# Patient Record
Sex: Female | Born: 1955
Health system: Southern US, Community
[De-identification: ages and names within clinical notes are randomized; demographics above are authoritative.]

## PROBLEM LIST (undated history)

## (undated) DIAGNOSIS — N183 Chronic kidney disease, stage 3 unspecified: Secondary | ICD-10-CM

## (undated) DIAGNOSIS — I779 Disorder of arteries and arterioles, unspecified: Secondary | ICD-10-CM

## (undated) DIAGNOSIS — Z9289 Personal history of other medical treatment: Secondary | ICD-10-CM

## (undated) DIAGNOSIS — K56609 Unspecified intestinal obstruction, unspecified as to partial versus complete obstruction: Secondary | ICD-10-CM

## (undated) DIAGNOSIS — D696 Thrombocytopenia, unspecified: Secondary | ICD-10-CM

## (undated) DIAGNOSIS — I10 Essential (primary) hypertension: Secondary | ICD-10-CM

## (undated) DIAGNOSIS — J45909 Unspecified asthma, uncomplicated: Secondary | ICD-10-CM

## (undated) DIAGNOSIS — R001 Bradycardia, unspecified: Secondary | ICD-10-CM

## (undated) DIAGNOSIS — I2510 Atherosclerotic heart disease of native coronary artery without angina pectoris: Secondary | ICD-10-CM

## (undated) DIAGNOSIS — E039 Hypothyroidism, unspecified: Secondary | ICD-10-CM

## (undated) DIAGNOSIS — K429 Umbilical hernia without obstruction or gangrene: Secondary | ICD-10-CM

## (undated) DIAGNOSIS — F419 Anxiety disorder, unspecified: Secondary | ICD-10-CM

## (undated) DIAGNOSIS — I739 Peripheral vascular disease, unspecified: Secondary | ICD-10-CM

## (undated) DIAGNOSIS — N186 End stage renal disease: Secondary | ICD-10-CM

## (undated) DIAGNOSIS — E119 Type 2 diabetes mellitus without complications: Secondary | ICD-10-CM

## (undated) DIAGNOSIS — I219 Acute myocardial infarction, unspecified: Secondary | ICD-10-CM

## (undated) DIAGNOSIS — I509 Heart failure, unspecified: Secondary | ICD-10-CM

## (undated) DIAGNOSIS — Z95820 Peripheral vascular angioplasty status with implants and grafts: Secondary | ICD-10-CM

## (undated) DIAGNOSIS — R011 Cardiac murmur, unspecified: Secondary | ICD-10-CM

## (undated) DIAGNOSIS — J189 Pneumonia, unspecified organism: Secondary | ICD-10-CM

## (undated) DIAGNOSIS — D649 Anemia, unspecified: Secondary | ICD-10-CM

## (undated) DIAGNOSIS — Z992 Dependence on renal dialysis: Secondary | ICD-10-CM

## (undated) DIAGNOSIS — I5020 Unspecified systolic (congestive) heart failure: Secondary | ICD-10-CM

## (undated) DIAGNOSIS — E785 Hyperlipidemia, unspecified: Secondary | ICD-10-CM

## (undated) DIAGNOSIS — M109 Gout, unspecified: Secondary | ICD-10-CM

## (undated) DIAGNOSIS — R0600 Dyspnea, unspecified: Secondary | ICD-10-CM

## (undated) HISTORY — DX: Unspecified intestinal obstruction, unspecified as to partial versus complete obstruction: K56.609

## (undated) HISTORY — DX: Dependence on renal dialysis: N18.6

## (undated) HISTORY — DX: Gout, unspecified: M10.9

## (undated) HISTORY — DX: Anemia, unspecified: D64.9

## (undated) HISTORY — PX: SHOULDER SURGERY: SHX246

## (undated) HISTORY — DX: Acute myocardial infarction, unspecified: I21.9

## (undated) HISTORY — DX: End stage renal disease: Z99.2

## (undated) HISTORY — DX: Hyperlipidemia, unspecified: E78.5

## (undated) HISTORY — DX: Essential (primary) hypertension: I10

## (undated) HISTORY — DX: Disorder of arteries and arterioles, unspecified: I77.9

## (undated) HISTORY — DX: Unspecified asthma, uncomplicated: J45.909

## (undated) HISTORY — DX: Type 2 diabetes mellitus without complications: E11.9

## (undated) HISTORY — DX: Umbilical hernia without obstruction or gangrene: K42.9

## (undated) HISTORY — DX: Atherosclerotic heart disease of native coronary artery without angina pectoris: I25.10

## (undated) HISTORY — DX: Peripheral vascular disease, unspecified: I73.9

---

## 1987-09-19 HISTORY — PX: HERNIA REPAIR: SHX51

## 2003-09-19 HISTORY — PX: CORONARY ARTERY BYPASS GRAFT: SHX141

## 2003-11-13 ENCOUNTER — Inpatient Hospital Stay (HOSPITAL_COMMUNITY): Admission: EM | Admit: 2003-11-13 | Discharge: 2003-11-21 | Payer: Self-pay | Admitting: Cardiology

## 2004-01-18 ENCOUNTER — Inpatient Hospital Stay (HOSPITAL_COMMUNITY): Admission: EM | Admit: 2004-01-18 | Discharge: 2004-01-23 | Payer: Self-pay | Admitting: *Deleted

## 2004-03-24 ENCOUNTER — Ambulatory Visit (HOSPITAL_COMMUNITY): Admission: RE | Admit: 2004-03-24 | Discharge: 2004-03-25 | Payer: Self-pay | Admitting: *Deleted

## 2004-03-30 ENCOUNTER — Encounter: Payer: Self-pay | Admitting: Cardiology

## 2004-04-06 ENCOUNTER — Ambulatory Visit (HOSPITAL_COMMUNITY): Admission: RE | Admit: 2004-04-06 | Discharge: 2004-04-07 | Payer: Self-pay | Admitting: *Deleted

## 2004-06-20 ENCOUNTER — Inpatient Hospital Stay (HOSPITAL_COMMUNITY): Admission: EM | Admit: 2004-06-20 | Discharge: 2004-06-22 | Payer: Self-pay | Admitting: Emergency Medicine

## 2004-07-16 ENCOUNTER — Ambulatory Visit: Payer: Self-pay | Admitting: Cardiology

## 2004-07-16 ENCOUNTER — Inpatient Hospital Stay (HOSPITAL_COMMUNITY): Admission: EM | Admit: 2004-07-16 | Discharge: 2004-07-20 | Payer: Self-pay | Admitting: Cardiology

## 2004-07-29 ENCOUNTER — Ambulatory Visit: Payer: Self-pay | Admitting: Cardiology

## 2004-08-05 ENCOUNTER — Ambulatory Visit: Payer: Self-pay | Admitting: Cardiology

## 2004-10-03 ENCOUNTER — Ambulatory Visit: Payer: Self-pay | Admitting: *Deleted

## 2004-10-04 ENCOUNTER — Inpatient Hospital Stay (HOSPITAL_COMMUNITY): Admission: AD | Admit: 2004-10-04 | Discharge: 2004-10-05 | Payer: Self-pay | Admitting: *Deleted

## 2004-10-07 ENCOUNTER — Ambulatory Visit: Payer: Self-pay | Admitting: Cardiology

## 2004-10-14 ENCOUNTER — Ambulatory Visit: Payer: Self-pay | Admitting: Cardiology

## 2004-11-18 ENCOUNTER — Ambulatory Visit (HOSPITAL_COMMUNITY): Admission: RE | Admit: 2004-11-18 | Discharge: 2004-11-19 | Payer: Self-pay | Admitting: Orthopedic Surgery

## 2005-01-23 ENCOUNTER — Emergency Department (HOSPITAL_COMMUNITY): Admission: EM | Admit: 2005-01-23 | Discharge: 2005-01-23 | Payer: Self-pay | Admitting: Emergency Medicine

## 2005-02-01 ENCOUNTER — Ambulatory Visit: Payer: Self-pay | Admitting: Cardiology

## 2005-07-17 ENCOUNTER — Ambulatory Visit: Payer: Self-pay | Admitting: Cardiology

## 2005-08-29 ENCOUNTER — Ambulatory Visit: Payer: Self-pay | Admitting: Cardiology

## 2005-08-30 ENCOUNTER — Ambulatory Visit (HOSPITAL_COMMUNITY): Admission: RE | Admit: 2005-08-30 | Discharge: 2005-08-30 | Payer: Self-pay | Admitting: Orthopedic Surgery

## 2005-11-07 ENCOUNTER — Ambulatory Visit: Payer: Self-pay | Admitting: Cardiology

## 2006-08-27 ENCOUNTER — Encounter: Payer: Self-pay | Admitting: Cardiology

## 2006-08-27 ENCOUNTER — Inpatient Hospital Stay (HOSPITAL_COMMUNITY): Admission: EM | Admit: 2006-08-27 | Discharge: 2006-08-31 | Payer: Self-pay | Admitting: Emergency Medicine

## 2006-08-27 ENCOUNTER — Ambulatory Visit: Payer: Self-pay | Admitting: Cardiovascular Disease

## 2006-09-14 ENCOUNTER — Ambulatory Visit: Payer: Self-pay | Admitting: Cardiology

## 2006-10-26 ENCOUNTER — Ambulatory Visit: Payer: Self-pay | Admitting: Cardiology

## 2006-12-17 ENCOUNTER — Ambulatory Visit: Payer: Self-pay | Admitting: Cardiology

## 2007-01-17 ENCOUNTER — Ambulatory Visit: Payer: Self-pay | Admitting: Endocrinology

## 2007-01-30 ENCOUNTER — Ambulatory Visit: Payer: Self-pay | Admitting: Endocrinology

## 2007-02-22 ENCOUNTER — Ambulatory Visit: Payer: Self-pay | Admitting: Endocrinology

## 2007-03-27 ENCOUNTER — Ambulatory Visit: Payer: Self-pay | Admitting: Endocrinology

## 2007-03-31 DIAGNOSIS — I2510 Atherosclerotic heart disease of native coronary artery without angina pectoris: Secondary | ICD-10-CM | POA: Insufficient documentation

## 2007-03-31 DIAGNOSIS — I9589 Other hypotension: Secondary | ICD-10-CM | POA: Insufficient documentation

## 2007-03-31 DIAGNOSIS — E785 Hyperlipidemia, unspecified: Secondary | ICD-10-CM | POA: Insufficient documentation

## 2007-03-31 DIAGNOSIS — E782 Mixed hyperlipidemia: Secondary | ICD-10-CM | POA: Insufficient documentation

## 2007-03-31 DIAGNOSIS — I10 Essential (primary) hypertension: Secondary | ICD-10-CM | POA: Insufficient documentation

## 2007-04-24 ENCOUNTER — Ambulatory Visit: Payer: Self-pay | Admitting: Endocrinology

## 2007-05-18 ENCOUNTER — Encounter: Payer: Self-pay | Admitting: Endocrinology

## 2007-05-27 ENCOUNTER — Ambulatory Visit: Payer: Self-pay | Admitting: Endocrinology

## 2007-05-27 ENCOUNTER — Encounter: Payer: Self-pay | Admitting: Endocrinology

## 2007-05-27 LAB — CONVERTED CEMR LAB
Creatinine,U: 66.7 mg/dL
Microalb, Ur: 0.2 mg/dL (ref 0.0–1.9)

## 2007-07-22 ENCOUNTER — Telehealth: Payer: Self-pay | Admitting: Endocrinology

## 2007-08-13 ENCOUNTER — Encounter: Payer: Self-pay | Admitting: Endocrinology

## 2007-08-14 ENCOUNTER — Encounter: Payer: Self-pay | Admitting: Endocrinology

## 2007-08-19 ENCOUNTER — Ambulatory Visit: Payer: Self-pay | Admitting: Endocrinology

## 2007-12-17 ENCOUNTER — Ambulatory Visit: Payer: Self-pay | Admitting: Endocrinology

## 2008-01-06 ENCOUNTER — Emergency Department (HOSPITAL_COMMUNITY): Admission: EM | Admit: 2008-01-06 | Discharge: 2008-01-07 | Payer: Self-pay | Admitting: Emergency Medicine

## 2008-03-12 ENCOUNTER — Ambulatory Visit: Payer: Self-pay | Admitting: Endocrinology

## 2008-05-13 ENCOUNTER — Telehealth (INDEPENDENT_AMBULATORY_CARE_PROVIDER_SITE_OTHER): Payer: Self-pay | Admitting: *Deleted

## 2008-06-15 ENCOUNTER — Ambulatory Visit: Payer: Self-pay | Admitting: Endocrinology

## 2008-06-15 LAB — CONVERTED CEMR LAB: Hgb A1c MFr Bld: 7.1 % — ABNORMAL HIGH (ref 4.6–6.0)

## 2008-09-18 ENCOUNTER — Emergency Department (HOSPITAL_COMMUNITY): Admission: EM | Admit: 2008-09-18 | Discharge: 2008-09-18 | Payer: Self-pay | Admitting: Emergency Medicine

## 2008-09-18 HISTORY — PX: CHOLECYSTECTOMY: SHX55

## 2008-09-21 ENCOUNTER — Telehealth: Payer: Self-pay | Admitting: Endocrinology

## 2008-10-09 ENCOUNTER — Ambulatory Visit: Payer: Self-pay | Admitting: Endocrinology

## 2008-10-12 ENCOUNTER — Telehealth: Payer: Self-pay | Admitting: Endocrinology

## 2008-10-12 ENCOUNTER — Encounter (INDEPENDENT_AMBULATORY_CARE_PROVIDER_SITE_OTHER): Payer: Self-pay | Admitting: *Deleted

## 2008-10-15 ENCOUNTER — Telehealth: Payer: Self-pay | Admitting: Endocrinology

## 2008-12-25 ENCOUNTER — Encounter: Payer: Self-pay | Admitting: Endocrinology

## 2009-01-07 ENCOUNTER — Ambulatory Visit: Payer: Self-pay | Admitting: Endocrinology

## 2009-06-22 ENCOUNTER — Ambulatory Visit: Payer: Self-pay | Admitting: Endocrinology

## 2009-07-29 ENCOUNTER — Encounter: Payer: Self-pay | Admitting: Cardiology

## 2009-07-30 ENCOUNTER — Encounter: Payer: Self-pay | Admitting: Cardiology

## 2009-08-26 ENCOUNTER — Encounter: Payer: Self-pay | Admitting: Cardiology

## 2009-08-26 DIAGNOSIS — J45909 Unspecified asthma, uncomplicated: Secondary | ICD-10-CM | POA: Insufficient documentation

## 2009-08-26 DIAGNOSIS — E669 Obesity, unspecified: Secondary | ICD-10-CM | POA: Insufficient documentation

## 2009-08-27 ENCOUNTER — Ambulatory Visit: Payer: Self-pay | Admitting: Cardiology

## 2009-08-27 DIAGNOSIS — F17200 Nicotine dependence, unspecified, uncomplicated: Secondary | ICD-10-CM | POA: Insufficient documentation

## 2009-09-03 ENCOUNTER — Ambulatory Visit: Payer: Self-pay | Admitting: Cardiology

## 2009-09-03 ENCOUNTER — Encounter: Payer: Self-pay | Admitting: Cardiology

## 2009-09-08 ENCOUNTER — Encounter (INDEPENDENT_AMBULATORY_CARE_PROVIDER_SITE_OTHER): Payer: Self-pay | Admitting: *Deleted

## 2009-09-18 DIAGNOSIS — K56609 Unspecified intestinal obstruction, unspecified as to partial versus complete obstruction: Secondary | ICD-10-CM

## 2009-09-18 HISTORY — DX: Unspecified intestinal obstruction, unspecified as to partial versus complete obstruction: K56.609

## 2009-10-20 ENCOUNTER — Ambulatory Visit: Payer: Self-pay | Admitting: Endocrinology

## 2009-10-20 LAB — CONVERTED CEMR LAB: Fructosamine: 278 micromoles/L (ref <285)

## 2010-01-02 ENCOUNTER — Ambulatory Visit: Payer: Self-pay | Admitting: Cardiology

## 2010-01-03 ENCOUNTER — Ambulatory Visit: Payer: Self-pay | Admitting: Cardiology

## 2010-01-03 ENCOUNTER — Inpatient Hospital Stay (HOSPITAL_COMMUNITY): Admission: EM | Admit: 2010-01-03 | Discharge: 2010-01-06 | Payer: Self-pay | Admitting: Cardiovascular Disease

## 2010-01-03 ENCOUNTER — Encounter: Payer: Self-pay | Admitting: Cardiovascular Disease

## 2010-01-06 ENCOUNTER — Encounter: Payer: Self-pay | Admitting: Cardiovascular Disease

## 2010-01-06 ENCOUNTER — Telehealth (INDEPENDENT_AMBULATORY_CARE_PROVIDER_SITE_OTHER): Payer: Self-pay | Admitting: *Deleted

## 2010-01-07 ENCOUNTER — Ambulatory Visit: Payer: Self-pay | Admitting: Cardiology

## 2010-01-07 ENCOUNTER — Encounter (INDEPENDENT_AMBULATORY_CARE_PROVIDER_SITE_OTHER): Payer: Self-pay | Admitting: *Deleted

## 2010-01-07 ENCOUNTER — Ambulatory Visit: Payer: Self-pay | Admitting: Internal Medicine

## 2010-01-07 ENCOUNTER — Inpatient Hospital Stay (HOSPITAL_COMMUNITY): Admission: EM | Admit: 2010-01-07 | Discharge: 2010-01-10 | Payer: Self-pay | Admitting: Emergency Medicine

## 2010-01-08 ENCOUNTER — Encounter: Payer: Self-pay | Admitting: Cardiology

## 2010-01-09 ENCOUNTER — Encounter: Payer: Self-pay | Admitting: Cardiology

## 2010-01-18 ENCOUNTER — Ambulatory Visit: Payer: Self-pay | Admitting: Endocrinology

## 2010-01-18 ENCOUNTER — Encounter: Payer: Self-pay | Admitting: Cardiology

## 2010-01-18 DIAGNOSIS — R809 Proteinuria, unspecified: Secondary | ICD-10-CM | POA: Insufficient documentation

## 2010-01-18 LAB — CONVERTED CEMR LAB
Hgb A1c MFr Bld: 8.2 % — ABNORMAL HIGH (ref 4.6–6.5)
Microalb Creat Ratio: 39 mg/g — ABNORMAL HIGH (ref 0.0–30.0)
Microalb, Ur: 3.1 mg/dL — ABNORMAL HIGH (ref 0.0–1.9)

## 2010-01-20 ENCOUNTER — Encounter: Payer: Self-pay | Admitting: Cardiology

## 2010-01-27 ENCOUNTER — Encounter: Payer: Self-pay | Admitting: Cardiology

## 2010-01-28 ENCOUNTER — Ambulatory Visit: Payer: Self-pay | Admitting: Cardiology

## 2010-02-08 ENCOUNTER — Encounter: Payer: Self-pay | Admitting: Cardiology

## 2010-02-21 ENCOUNTER — Telehealth: Payer: Self-pay | Admitting: Endocrinology

## 2010-02-28 ENCOUNTER — Telehealth: Payer: Self-pay | Admitting: Endocrinology

## 2010-03-04 ENCOUNTER — Ambulatory Visit (HOSPITAL_COMMUNITY): Admission: RE | Admit: 2010-03-04 | Discharge: 2010-03-07 | Payer: Self-pay | Admitting: General Surgery

## 2010-03-04 ENCOUNTER — Encounter: Payer: Self-pay | Admitting: Cardiology

## 2010-03-04 HISTORY — PX: OTHER SURGICAL HISTORY: SHX169

## 2010-03-07 ENCOUNTER — Encounter: Payer: Self-pay | Admitting: Cardiology

## 2010-04-01 ENCOUNTER — Encounter: Payer: Self-pay | Admitting: Endocrinology

## 2010-04-05 ENCOUNTER — Encounter: Payer: Self-pay | Admitting: Cardiology

## 2010-04-19 ENCOUNTER — Ambulatory Visit: Payer: Self-pay | Admitting: Endocrinology

## 2010-04-19 LAB — CONVERTED CEMR LAB: Hgb A1c MFr Bld: 7.5 % — ABNORMAL HIGH (ref 4.6–6.5)

## 2010-07-21 ENCOUNTER — Ambulatory Visit: Payer: Self-pay | Admitting: Endocrinology

## 2010-10-18 NOTE — Medication Information (Signed)
Summary: Plavix Assitance  Plavix Assitance   Imported By: Gurney Maxin, RN, BSN 01/28/2010 16:10:12  _____________________________________________________________________  External Attachment:    Type:   Image     Comment:   External Document  Appended Document: Plavix Assitance Pt called stating she still hasn't heard anything about Plavix. Pt states she is out of Plavix and can't afford to get anymore right now. Spoke with assistance program staff member who states they did not receive fax from 01/28/10. She gave alternate fax number of [phone]. She also stated to put "Please Rush" on form when faxed. This was faxed and confirmation received. Pt notified. She will be provided with as many samples as we can give her at this time. However, discussed with pt importance of not being off Plavix and risk involved if she is off of this medication.   Pt given Samples [id number] 9/12 #8, [id number] 2/12, #4

## 2010-10-18 NOTE — Letter (Signed)
Summary: [facility]   Imported By: Phillis Knack 04/11/2010 11:03:07  _____________________________________________________________________  External Attachment:    Type:   Image     Comment:   External Document

## 2010-10-18 NOTE — Op Note (Signed)
[facility]   Imported By: Sallee Provencal 03/30/2010 16:47:41  _____________________________________________________________________  External Attachment:    Type:   Image     Comment:   External Document

## 2010-10-18 NOTE — Assessment & Plan Note (Signed)
Summary: 4 MTH FU  STC   Vital Signs:  Patient profile:   55 year old female Height:      67 inches (170.18 cm) Weight:      205.38 pounds (93.35 kg) BMI:     32.28 O2 Sat:      94 % on Room air Temp:     98.6 degrees F (37.00 degrees C) oral Pulse rate:   61 / minute BP sitting:   126 / 80  (left arm) Cuff size:   large  Vitals Entered By: Gardenia Phlegm CMA (October 20, 2009 10:36 AM)  O2 Flow:  Room air CC: 4 month follow up/ CF Is Patient Diabetic? Yes   Referring Provider:  Valentino Saxon Primary Provider:  Rory Percy  CC:  4 month follow up/ CF.  History of Present Illness: pt says she sometimes misses her insulin, as she recently had gb surgery (weight is down 11 lbs since last ov).  she has mild hypolgycemia after the evening meal.  she says it is still highest in am.  Current Medications (verified): 1)  Humulin N Pen 100 Unit/ml Susp (Insulin Isophane Human) .... Inject 20 Units Subcutaneously At Bedtime 2)  Lipitor 80 Mg Tabs (Atorvastatin Calcium) .... Take One Tablet By Mouth Daily. 3)  Metoprolol Tartrate 25 Mg Tabs (Metoprolol Tartrate) .... Take One Tablet By Mouth Twice A Day 4)  Plavix 75 Mg  Tabs (Clopidogrel Bisulfate) .... Take 1 By Mouth Qd 5)  Nitroquick 0.4 Mg Subl (Nitroglycerin) 6)  Humalog Kwikpen 100 Unit/ml  Soln (Insulin Lispro (Human)) .... 25 Units At Breakfast, 25 Units With Lunch, and 10 With Supper 7)  Accupril 5 Mg  Tabs (Quinapril Hcl) .... Take 1 By Mouth Qd 8)  Isosorbide Dinitrate 20 Mg Tabs (Isosorbide Dinitrate) .... Take 3 Tablets By Mouth Two Times A Day 9)  Bayer Aspirin 325 Mg  Tabs (Aspirin) .Marland Kitchen.. 1 By Mouth Once Daily 10)  Colchicine 0.6 Mg  Tabs (Colchicine) .Marland Kitchen.. 1 By Mouth Once Daily As Needed For Gout 11)  Bd U/f Short Pen Needle 31g X 8 Mm Misc (Insulin Pen Needle) .... Inject Three Times A Day  Allergies (verified): 1)  Amoxicillin (Amoxicillin) 2)  Penicillin  Past History:  Past Medical History: Last updated:  08/26/2009 CAD...cutting balloon angioplasty to vein graft... December, 2007 Groin bleed.... significant... PCI December, 2007 CABG Diabetes mellitus, type II Hyperlipidemia Hypertension Renal failure gout right leg discomfort.... chronic LV function.... good Asthma Overweight  Review of Systems  The patient denies syncope.    Physical Exam  General:  obese.  no distress  Neck:  Supple without thyroid enlargement or tenderness. No cervical lymphadenopathy Additional Exam:  (pt says her a1c was 7.6 at dr [facility] < 1 month ago)  fructosamine=278 (converts to a1c of 6.4)  FastTSH                   1.92 uIU/mL   Impression & Recommendations:  Problem # 1:  DIABETES MELLITUS, TYPE II (ICD-250.00) slightly overcontrolled  Other Orders: T-Fructosamine [id number]) TLB-TSH (Thyroid Stimulating Hormone) (84443-TSH) Est. Patient Level III [id number])  Patient Instructions: 1)  continue: 2)  nph 20 units at night 3)  humalog (just before each meal) 25-25-10 (and 5 units with bedtime snack, if you choose to eat one.  4)  ret 3 mos 5)  check your blood sugar 3 times a day.  vary the time of day when you check, between before the 3 meals,  and at bedtime.  also check if you have symptoms of your blood sugar being too high or too low.  please keep a record of the readings and bring it to your next appointment here.  please call us sooner if you are having low blood sugar episodes. 6)  tests are being ordered for you today.  a few days after the test(s), please call [phone] to hear your test results. 7)  (update: i left message on phone-tree:  rx as we discussed) 8)  reduce humalog to (just before each meal) 25-25-5 units.  call if any more hypoglycemia)

## 2010-10-18 NOTE — Letter (Signed)
Summary: Appointment- Rescheduled  [facility] [place]. [address]   [place], [place] [address]   Phone: [phone]  Fax: [phone]     Jan 20, 2010 MRN: [id number]     Donna Howe [address] [place], [place]  [address]   Dear Ms. Lamarre,   Due to a change in our office schedule, your appointment on  May 13th, 2011 at  12:45pm must be changed.   Your new appointment will be May 13th, 2011 at 1:45pm.  We look forward to participating in your health care needs.      Sincerely,  Public relations account executive

## 2010-10-18 NOTE — Letter (Signed)
[facility]   Imported By: Sallee Provencal 03/30/2010 16:50:07  _____________________________________________________________________  External Attachment:    Type:   Image     Comment:   External Document

## 2010-10-18 NOTE — Assessment & Plan Note (Signed)
Summary: [facility] 4/21  Medications Added NITROSTAT 0.4 MG SUBL (NITROGLYCERIN) dissolve one tablet under tongue for severe chest pain as needed every 5 minutes, not to exceed 3 in 15 min time frame HUMALOG KWIKPEN 100 UNIT/ML  SOLN (INSULIN LISPRO (HUMAN)) 25 units at breakfast, 5 units with lunch, and 15 with supper        Visit Type:  surgical clearance Referring Provider:  Marlaine Hind, MD Primary Provider:  Rory Percy  CC:  CAD.  History of Present Illness: The patient has known coronary artery disease.  I have reviewed all of her recent medical records from the hospital.  She was admitted with cardiac symptoms.  Catheterization on January 05, 2010 revealed that her grafts were patent but that she had a high-grade stenosis in one of her stents.  She underwent successful cutting balloon angioplasty to this area.  She is well cardiac viewpoint since then.  However she was readmitted to the hospital on January 07, 2010 with partial small bowel obstruction related to a large left lower abdominal hernia.  Fortunately she stabilized medically.  She was discharged with plans to see her back and reassess whether she could undergo abdominal surgery in the near future.  She has not been having any significant chest pain.  She's not having any shortness of breath.  She did have some symptoms of left lower quadrant discomfort suggestive of her abdominal problems.  This occurred the other day and fortunately resolved.  Preventive Screening-Counseling & Management  Alcohol-Tobacco     Smoking Status: quit     Year Started: 25 + off/on     Year Quit: 3 weeks ago  Current Medications (verified): 1)  Humulin N Pen 100 Unit/ml Susp (Insulin Isophane Human) .... Inject 20 Units Subcutaneously At Bedtime 2)  Lipitor 80 Mg Tabs (Atorvastatin Calcium) .... Take One Tablet By Mouth Daily. 3)  Metoprolol Tartrate 25 Mg Tabs (Metoprolol Tartrate) .... Take One Tablet By Mouth Twice A Day 4)  Plavix 75 Mg   Tabs (Clopidogrel Bisulfate) .... Take 1 By Mouth Qd 5)  Nitrostat 0.4 Mg Subl (Nitroglycerin) .... Dissolve One Tablet Under Tongue For Severe Chest Pain As Needed Every 5 Minutes, Not To Exceed 3 in 15 Min Time Frame 6)  Humalog Kwikpen 100 Unit/ml  Soln (Insulin Lispro (Human)) .... 25 Units At Breakfast, 5 Units With Lunch, and 15 With Supper 7)  Accupril 5 Mg  Tabs (Quinapril Hcl) .... Take 1 By Mouth Qd 8)  Isosorbide Dinitrate 20 Mg Tabs (Isosorbide Dinitrate) .... Take 3 Tablets By Mouth Two Times A Day 9)  Bayer Aspirin 325 Mg  Tabs (Aspirin) .Marland Kitchen.. 1 By Mouth Once Daily 10)  Colchicine 0.6 Mg  Tabs (Colchicine) .Marland Kitchen.. 1 By Mouth Once Daily As Needed For Gout 11)  Bd U/f Short Pen Needle 31g X 8 Mm Misc (Insulin Pen Needle) .... Inject Three Times A Day  Allergies: 1)  Amoxicillin (Amoxicillin) 2)  Penicillin   Samples of Plavix given: Lot # Jocelyn Lamer Exp Date: 3/12 #8 Lot #[id number] Exp Date: 2/12 #8 Lot #[id number] Exp Date: 9/12 #4 Gurney Maxin, RN, BSN  Jan 28, 2010 3:13 PM   Past History:  Past Medical History: Last updated: 01/27/2010 CAD...cutting balloon angioplasty to vein graft... December, 2007 /  catheterization  January 05, 2010... PCI.  In-stent restenosis and the anastomosis of the SVG to OM Small bowel obstruction... January 07, 2010   ( days after PCI)... medical treatment... plan abdominal surgery later Groin bleed.Marland KitchenMarland KitchenMarland Kitchen  significant... PCI December, 2007 CABG  2005... LIMA LAD, SVG OM, SVG right coronary artery Diabetes mellitus, type II Hyperlipidemia Hypertension Renal failure gout right leg discomfort.... chronic EF 60%... echo... December 2 010 Asthma Overweight  Social History: Smoking Status:  quit  Review of Systems       Patient denies fever, chills, headache, sweats, rash, change in vision, change in hearing, chest pain, cough, nausea vomiting, urinary symptoms.  All other systems are reviewed and are negative.  Vital Signs:  Patient profile:   55 year old  female Height:      66 inches Weight:      214 pounds Pulse rate:   61 / minute BP sitting:   131 / 69  (left arm) Cuff size:   regular  Vitals Entered By: Lovina Reach, LPN (May 13, 624THL [id number] PM) CC: CAD Is Patient Diabetic? Yes Comments post hosp    Physical Exam  General:  patient is stable today. Head:  head is atraumatic. Eyes:  no xanthelasma. Neck:  no jugular distention. Chest Wall:  no chest wall tenderness. Lungs:  lungs are clear.  Respiratory effort is nonlabored. Heart:  cardiac exam reveals S1-S2.  No clicks or significant murmurs. Abdomen:  abdomen is soft. Msk:  no musculoskeletal deformities. Extremities:  no peripheral edema. Skin:  no skin rashes. Psych:  patient is oriented to person time and place.  Affect is normal.   Impression & Recommendations:  Problem # 1:  * SMALL BOWEL OBSTRUCTION The patient needs surgery for the hernia that causes her intermittent small bowel obstruction.  After careful review of all data she can be cleared for this surgery in June with Dr. Marlaine Hind.  The patient's Plavix can be stopped 5 days before the operation.  Hopefully she can remain on aspirin.  Plavix can be restarted as soon it is safe postop.  If it is absolutely necessary her aspirin can also be held for the surgery.  It is not absolutely necessary my preference would be to keep her on aspirin.  Problem # 2:  OVERWEIGHT (ICD-278.02) Weight loss will be very important to her for multiple reasons.  Problem # 3:  HYPERTENSION (ICD-401.9)  Her updated medication list for this problem includes:    Metoprolol Tartrate 25 Mg Tabs (Metoprolol tartrate) .Marland Kitchen... Take one tablet by mouth twice a day    Accupril 5 Mg Tabs (Quinapril hcl) .Marland Kitchen... Take 1 by mouth qd    Bayer Aspirin 325 Mg Tabs (Aspirin) .Marland Kitchen... 1 by mouth once daily Blood pressure is controlled today.  No change in therapy.  Problem # 4:  HYPERLIPIDEMIA (B2193296.4)  Her updated medication list for this problem  includes:    Lipitor 80 Mg Tabs (Atorvastatin calcium) .Marland Kitchen... Take one tablet by mouth daily. Patient is not on treatment for her lipids.  No change in therapy.  Problem # 5:  CORONARY ARTERY DISEASE (ICD-414.00) Coronary disease is stable.  I have reviewed her prior data at great length.  It is very important to note that she has had drug-eluting stents in the past but none within the most recent 2 years.  Her most recent intervention was a cutting balloon procedure with no new stents.  Therefore considering the difficulty she's had with her abdominal hernia it is appropriate to hold her Plavix in June of this year so that she can have abdominal surgery.  Patient Instructions: 1)  Your physician wants you to follow-up in: 3 months. You will receive a reminder  letter in the mail one-two months in advance. If you don't receive a letter, please call our office to schedule the follow-up appointment. 2)  Your physician recommends that you continue on your current medications as directed. Please refer to the Current Medication list given to you today.

## 2010-10-18 NOTE — Letter (Signed)
Summary: [facility]   Imported By: Ranell Patrick 03/24/2010 16:26:09  _____________________________________________________________________  External Attachment:    Type:   Image     Comment:   External Document

## 2010-10-18 NOTE — Miscellaneous (Signed)
  Clinical Lists Changes  Problems: Added new problem of * SMALL BOWEL OBSTRUCTION Observations: Added new observation of PAST MED HX: CAD...cutting balloon angioplasty to vein graft... December, 2007 /  catheterization  January 05, 2010... PCI.  In-stent restenosis and the anastomosis of the SVG to OM Small bowel obstruction... January 07, 2010   ( days after PCI)... medical treatment... plan abdominal surgery later Groin bleed.... significant... PCI December, 2007 CABG  2005... LIMA LAD, SVG OM, SVG right coronary artery Diabetes mellitus, type II Hyperlipidemia Hypertension Renal failure gout right leg discomfort.... chronic EF 60%... echo... December 2 010 Asthma Overweight (01/27/2010 16:34) Added new observation of REFERRING MD: Valentino Saxon (01/27/2010 16:34) Added new observation of PRIMARY MD: Rory Percy (01/27/2010 16:34)       Past History:  Past Medical History: CAD...cutting balloon angioplasty to vein graft... December, 2007 /  catheterization  January 05, 2010... PCI.  In-stent restenosis and the anastomosis of the SVG to OM Small bowel obstruction... January 07, 2010   ( days after PCI)... medical treatment... plan abdominal surgery later Groin bleed.... significant... PCI December, 2007 CABG  2005... LIMA LAD, SVG OM, SVG right coronary artery Diabetes mellitus, type II Hyperlipidemia Hypertension Renal failure gout right leg discomfort.... chronic EF 60%... echo... December 2 010 Asthma Overweight

## 2010-10-18 NOTE — Progress Notes (Signed)
Summary: plavix rx    Phone Note Call from Patient   Summary of Call: Message taken - per Rhonda need to call in rx for Plavix, has card for 14 day supply.   #30 sent to pharm on file, has upcoming appt. with Dr. Ron Parker 5/13.    Initial call taken by: Lovina Reach, LPN,  April 21, 624THL 4:46 PM

## 2010-10-18 NOTE — Progress Notes (Signed)
Summary: Surgery Insulin  Phone Note Call from Patient Call back at Home Phone [phone]   Caller: Patient Summary of Call: Pt states she is scheuled to have surgery (Hernia repair) on 06/17 and was advised by surgeon to contact Endo MD for advisement on Insulin before and after surgery. Initial call taken by: Crissie Sickles, [place],  February 21, 2010 2:46 PM  Follow-up for Phone Call        night before surgery, take only 10 units nph.   day of surgery, skip humalog for any skipped meal.   when eating again, resume humalog at 1/2 the scheduled amount.  when you notice cbg's to go to high-100's on this dosage, resume full amount. Follow-up by: Donavan Foil MD,  February 21, 2010 3:01 PM  Additional Follow-up for Phone Call Additional follow up Details #1::        pt informed Additional Follow-up by: Crissie Sickles, [place],  February 21, 2010 3:33 PM

## 2010-10-18 NOTE — Assessment & Plan Note (Signed)
Summary: 3 MO ROV /NWS   Vital Signs:  Patient profile:   55 year old female Height:      66 inches (167.64 cm) Weight:      209.50 pounds (95.23 kg) BMI:     33.94 O2 Sat:      97 % on Room air Temp:     98.8 degrees F (37.11 degrees C) oral Pulse rate:   66 / minute BP sitting:   122 / 76  (left arm) Cuff size:   regular  Vitals Entered By: Rebeca Alert CMA Deborra Medina) (July 21, 2010 10:48 AM)  O2 Flow:  Room air CC: 3 month F/U/pt declined flu shot/aj Is Patient Diabetic? Yes   Referring Garet Hooton:  Marlaine Hind, MD Primary Dalinda Heidt:  Rory Percy  CC:  3 month F/U/pt declined flu shot/aj.  History of Present Illness: no cbg record, but states cbg's are occasional mild hypoglycemia before lunch.  pt states she feels well in general.  it is highest in am (occasionally as high as 200).  it is higher in am than at hs, despite no hs-snack.    Current Medications (verified): 1)  Humulin N Pen 100 Unit/ml Susp (Insulin Isophane Human) .... Inject 20 Units Subcutaneously At Bedtime 2)  Lipitor 80 Mg Tabs (Atorvastatin Calcium) .... Take One Tablet By Mouth Daily. 3)  Metoprolol Tartrate 25 Mg Tabs (Metoprolol Tartrate) .... Take One Tablet By Mouth Twice A Day 4)  Plavix 75 Mg  Tabs (Clopidogrel Bisulfate) .... Take 1 By Mouth Qd 5)  Nitrostat 0.4 Mg Subl (Nitroglycerin) .... Dissolve One Tablet Under Tongue For Severe Chest Pain As Needed Every 5 Minutes, Not To Exceed 3 in 15 Min Time Frame 6)  Humalog Kwikpen 100 Unit/ml  Soln (Insulin Lispro (Human)) .... 25 Units At Breakfast, 5 Units With Lunch, and 20 With Supper 7)  Accupril 5 Mg  Tabs (Quinapril Hcl) .... Take 1 By Mouth Qd 8)  Isosorbide Dinitrate 20 Mg Tabs (Isosorbide Dinitrate) .... Take 3 Tablets By Mouth Two Times A Day 9)  Bayer Aspirin 325 Mg  Tabs (Aspirin) .Marland Kitchen.. 1 By Mouth Once Daily 10)  Colchicine 0.6 Mg  Tabs (Colchicine) .Marland Kitchen.. 1 By Mouth Once Daily As Needed For Gout 11)  Bd U/f Short Pen Needle 31g X 8 Mm Misc  (Insulin Pen Needle) .... Inject Three Times A Day 12)  Furosemide 40 Mg Tabs (Furosemide) .Marland Kitchen.. 1 By Mouth Once Daily  Allergies (verified): 1)  Amoxicillin (Amoxicillin) 2)  Penicillin  Past History:  Past Medical History: Last updated: 01/27/2010 CAD...cutting balloon angioplasty to vein graft... December, 2007 /  catheterization  January 05, 2010... PCI.  In-stent restenosis and the anastomosis of the SVG to OM Small bowel obstruction... January 07, 2010   ( days after PCI)... medical treatment... plan abdominal surgery later Groin bleed.... significant... PCI December, 2007 CABG  2005... LIMA LAD, SVG OM, SVG right coronary artery Diabetes mellitus, type II Hyperlipidemia Hypertension Renal failure gout right leg discomfort.... chronic EF 60%... echo... December 2 010 Asthma Overweight  Review of Systems  The patient denies syncope.    Physical Exam  General:  obese.  no distress  Neck:  Supple without thyroid enlargement or tenderness. No cervical lymphadenopathy Additional Exam:  Hemoglobin A1C       [H]  7.2 %     Impression & Recommendations:  Problem # 1:  DIABETES MELLITUS, TYPE II (ICD-250.00) she needs some adjustment in her therapy  Medications Added to Medication  List This Visit: 1)  Humulin N Pen 100 Unit/ml Susp (Insulin isophane human) .... Inject 25 units subcutaneously at bedtime 2)  Humalog Kwikpen 100 Unit/ml Soln (Insulin lispro (human)) .... 20 units at breakfast, 5 units with lunch, and 20 with supper 3)  Furosemide 40 Mg Tabs (Furosemide) .Marland Kitchen.. 1 by mouth once daily  Other Orders: TLB-A1C / Hgb A1C (Glycohemoglobin) (83036-A1C) Est. Patient Level III [id number]) Est. Patient Level III [id number])  Patient Instructions: 1)  return 3 mos 2)  check your blood sugar 3 times a day.  vary the time of day when you check, between before the 3 meals, and at bedtime.  also check if you have symptoms of your blood sugar being too high or too low.  please keep a  record of the readings and bring it to your next appointment here.  please call us sooner if you are having low blood sugar episodes. 3)  tests are being ordered for you today.  a few days after the test(s), please call [phone] to hear your test results. 4)  pending the test results, please decrease humalog to (just before each meal) 20-5-20 units, and  increase nph 25 units at bedtime. 5)  (update: i left message on phone-tree:  rx as we discussed) Prescriptions: HUMALOG KWIKPEN 100 UNIT/ML  SOLN (INSULIN LISPRO (HUMAN)) 20 units at breakfast, 5 units with lunch, and 20 with supper  #1 box x 11   Entered and Authorized by:   Donavan Foil MD   Signed by:   Donavan Foil MD on 07/21/2010   Method used:   Print then Give to Patient   RxID:   [id number] HUMULIN N PEN 100 UNIT/ML SUSP (INSULIN ISOPHANE HUMAN) Inject 25 units subcutaneously at bedtime  #1 box x 11   Entered and Authorized by:   Donavan Foil MD   Signed by:   Donavan Foil MD on 07/21/2010   Method used:   Print then Give to Patient   RxID:   [id number]    Orders Added: 1)  TLB-A1C / Hgb A1C (Glycohemoglobin) [83036-A1C] 2)  Est. Patient Level III [id number] 3)  Est. Patient Level III [id number]

## 2010-10-18 NOTE — Assessment & Plan Note (Signed)
Summary: 3 mo rov /nws #   Vital Signs:  Patient profile:   55 year old female Height:      67 inches Weight:      212.50 pounds BMI:     33.40 O2 Sat:      98 % on Room air Temp:     97.6 degrees F oral Pulse rate:   67 / minute BP sitting:   160 / 82  (left arm) Cuff size:   large  Vitals Entered ByShirlean Mylar Ewing (Jan 18, 2010 9:15 AM)  O2 Flow:  Room air CC: 3 month followup./RE   Referring Provider:  Valentino Saxon Primary Provider:  Rory Percy  CC:  3 month followup./RE.  History of Present Illness: no cbg record, but states cbg's are seldom low (hs).  it is highest in am (400).  she feels this is due to eating at hs.  she eats 3 meals per day (11 am, 5 pm, and 9 pm). pt has lost weight, due to her efforts, and recent illnesses (hospitalized twice, mi and ventral hernia)  Current Medications (verified): 1)  Humulin N Pen 100 Unit/ml Susp (Insulin Isophane Human) .... Inject 20 Units Subcutaneously At Bedtime 2)  Lipitor 80 Mg Tabs (Atorvastatin Calcium) .... Take One Tablet By Mouth Daily. 3)  Metoprolol Tartrate 25 Mg Tabs (Metoprolol Tartrate) .... Take One Tablet By Mouth Twice A Day 4)  Plavix 75 Mg  Tabs (Clopidogrel Bisulfate) .... Take 1 By Mouth Qd 5)  Nitroquick 0.4 Mg Subl (Nitroglycerin) 6)  Humalog Kwikpen 100 Unit/ml  Soln (Insulin Lispro (Human)) .... 25 Units At Breakfast, 25 Units With Lunch, and 10 With Supper 7)  Accupril 5 Mg  Tabs (Quinapril Hcl) .... Take 1 By Mouth Qd 8)  Isosorbide Dinitrate 20 Mg Tabs (Isosorbide Dinitrate) .... Take 3 Tablets By Mouth Two Times A Day 9)  Bayer Aspirin 325 Mg  Tabs (Aspirin) .Marland Kitchen.. 1 By Mouth Once Daily 10)  Colchicine 0.6 Mg  Tabs (Colchicine) .Marland Kitchen.. 1 By Mouth Once Daily As Needed For Gout 11)  Bd U/f Short Pen Needle 31g X 8 Mm Misc (Insulin Pen Needle) .... Inject Three Times A Day  Allergies (verified): 1)  Amoxicillin (Amoxicillin) 2)  Penicillin  Past History:  Past Medical History: Last updated:  08/26/2009 CAD...cutting balloon angioplasty to vein graft... December, 2007 Groin bleed.... significant... PCI December, 2007 CABG Diabetes mellitus, type II Hyperlipidemia Hypertension Renal failure gout right leg discomfort.... chronic LV function.... good Asthma Overweight  Review of Systems  The patient denies syncope.    Physical Exam  General:  normal appearance.   Psych:  Alert and cooperative; normal mood and affect; normal attention span and concentration.   Additional Exam:  Hemoglobin A1C       [H]  8.2 %                       4.6-6.5 Microalbumin Ratio   [H]  39.0 mg/g     Impression & Recommendations:  Problem # 1:  DIABETES MELLITUS, TYPE II (ICD-250.00) needs increased rx  Problem # 2:  PROTEINURIA, MILD (ICD-791.0) Assessment: New  Other Orders: TLB-A1C / Hgb A1C (Glycohemoglobin) (83036-A1C) TLB-Microalbumin/Creat Ratio, Urine (82043-MALB) Est. Patient Level III [id number])  Patient Instructions: 1)  return 3 mos 2)  check your blood sugar 3 times a day.  vary the time of day when you check, between before the 3 meals, and at bedtime.  also check if  you have symptoms of your blood sugar being too high or too low.  please keep a record of the readings and bring it to your next appointment here.  please call us sooner if you are having low blood sugar episodes. 3)  tests are being ordered for you today.  a few days after the test(s), please call [phone] to hear your test results. 4)  pending the test results, please change humalog to (just before each meal) 25-5-15 units.  this is for the meal times we discused today. 5)  continue nph 20 units at bedtime 6)  (update: i left message on phone-tree:  rx as we discussed.  rx of microalbuminuria is rx of glucose, cholesterol, htn, and quitting smoking).

## 2010-10-18 NOTE — Letter (Signed)
Summary: [facility]  [facility]   Imported By: Marilynne Drivers 05/31/2010 16:57:39  _____________________________________________________________________  External Attachment:    Type:   Image     Comment:   External Document

## 2010-10-18 NOTE — Progress Notes (Signed)
Summary: Rx refill  Phone Note Refill Request   Refills Requested: Medication #1:  HUMULIN N PEN 100 UNIT/ML SUSP Inject 20 units subcutaneously at bedtime   Dosage confirmed as above?Dosage Confirmed Rx was sent to wrong pharmacy  Initial call taken by: Crissie Sickles, CMA,  February 21, 2010 11:46 AM    Prescriptions: HUMULIN N PEN 100 UNIT/ML SUSP (INSULIN ISOPHANE HUMAN) Inject 20 units subcutaneously at bedtime  #54mth x 5   Entered by:   Crissie Sickles, CMA   Authorized by:   Donavan Foil MD   Signed by:   Crissie Sickles, CMA on 02/21/2010   Method used:   Electronically to        [facility] # [phone]* (retail)       [place], [place]  [address]       Ph: [phone] or [phone]       Fax: [phone]   RxID:   [id number]

## 2010-10-18 NOTE — Medication Information (Signed)
Summary: RX Folder/ PICKED UP [facility]  RX Folder/ PICKED UP [facility]   Imported By: Bartholomew Boards 04/05/2010 15:10:59  _____________________________________________________________________  External Attachment:    Type:   Image     Comment:   External Document

## 2010-10-18 NOTE — Assessment & Plan Note (Signed)
Summary: 3 MTH FU  STC   Vital Signs:  Patient profile:   55 year old female Height:      66 inches (167.64 cm) Weight:      214.13 pounds (97.33 kg) BMI:     34.69 O2 Sat:      98 % on Room air Temp:     98.6 degrees F (37.00 degrees C) oral Pulse rate:   77 / minute BP sitting:   132 / 70  (left arm) Cuff size:   regular  Vitals Entered By: Rebeca Alert MA (April 19, 2010 1:54 PM)  O2 Flow:  Room air CC: 3 mo f/u/aj Is Patient Diabetic? Yes   Primary Provider:  Rory Percy  CC:  3 mo f/u/aj.  History of Present Illness: no cbg record, but states cbg was mildly low 2 days ago, in the afternoon.  it is highest in am (she feels this is dye to hs-eating).  pt states she feels well in general, except slight nausea since her abdominal hernia surgery.  Current Medications (verified): 1)  Humulin N Pen 100 Unit/ml Susp (Insulin Isophane Human) .... Inject 20 Units Subcutaneously At Bedtime 2)  Lipitor 80 Mg Tabs (Atorvastatin Calcium) .... Take One Tablet By Mouth Daily. 3)  Metoprolol Tartrate 25 Mg Tabs (Metoprolol Tartrate) .... Take One Tablet By Mouth Twice A Day 4)  Plavix 75 Mg  Tabs (Clopidogrel Bisulfate) .... Take 1 By Mouth Qd 5)  Nitrostat 0.4 Mg Subl (Nitroglycerin) .... Dissolve One Tablet Under Tongue For Severe Chest Pain As Needed Every 5 Minutes, Not To Exceed 3 in 15 Min Time Frame 6)  Humalog Kwikpen 100 Unit/ml  Soln (Insulin Lispro (Human)) .... 25 Units At Breakfast, 5 Units With Lunch, and 15 With Supper 7)  Accupril 5 Mg  Tabs (Quinapril Hcl) .... Take 1 By Mouth Qd 8)  Isosorbide Dinitrate 20 Mg Tabs (Isosorbide Dinitrate) .... Take 3 Tablets By Mouth Two Times A Day 9)  Bayer Aspirin 325 Mg  Tabs (Aspirin) .Marland Kitchen.. 1 By Mouth Once Daily 10)  Colchicine 0.6 Mg  Tabs (Colchicine) .Marland Kitchen.. 1 By Mouth Once Daily As Needed For Gout 11)  Bd U/f Short Pen Needle 31g X 8 Mm Misc (Insulin Pen Needle) .... Inject Three Times A Day  Allergies (verified): 1)   Amoxicillin (Amoxicillin) 2)  Penicillin  Past History:  Past Medical History: Last updated: 01/27/2010 CAD...cutting balloon angioplasty to vein graft... December, 2007 /  catheterization  January 05, 2010... PCI.  In-stent restenosis and the anastomosis of the SVG to OM Small bowel obstruction... January 07, 2010   ( days after PCI)... medical treatment... plan abdominal surgery later Groin bleed.... significant... PCI December, 2007 CABG  2005... LIMA LAD, SVG OM, SVG right coronary artery Diabetes mellitus, type II Hyperlipidemia Hypertension Renal failure gout right leg discomfort.... chronic EF 60%... echo... December 2 010 Asthma Overweight  Review of Systems  The patient denies syncope.    Physical Exam  General:  obese.  no distress  Pulses:  dorsalis pedis intact bilat. Extremities:  no deformity.  no ulcer on the feet.  feet are of normal color and temp.  no edema  Neurologic:  sensation is intact to touch on the feet  Additional Exam:  Hemoglobin A1C       [H]  7.5 %    Impression & Recommendations:  Problem # 1:  DIABETES MELLITUS, TYPE II (ICD-250.00) needs increased rx  Medications Added to Medication List This Visit:  1)  Humalog Kwikpen 100 Unit/ml Soln (Insulin lispro (human)) .... 25 units at breakfast, 5 units with lunch, and 20 with supper  Other Orders: TLB-A1C / Hgb A1C (Glycohemoglobin) (83036-A1C) Est. Patient Level III [id number])  Patient Instructions: 1)  return 3 mos 2)  check your blood sugar 3 times a day.  vary the time of day when you check, between before the 3 meals, and at bedtime.  also check if you have symptoms of your blood sugar being too high or too low.  please keep a record of the readings and bring it to your next appointment here.  please call us sooner if you are having low blood sugar episodes. 3)  tests are being ordered for you today.  a few days after the test(s), please call [phone] to hear your test results. 4)  pending  the test results, please continue humalog (just before each meal) 25-5-15 units.  this is for the meal times we discused today. 5)  continue nph 20 units at bedtime 6)  (update: i left message on phone-tree:  increase supper humalog to 20 units).

## 2010-10-18 NOTE — Progress Notes (Signed)
Summary: Rx wrong pharmacy  Phone Note Call from Patient Call back at Home Phone [phone]   Caller: Patient Summary of Call: Pt called requesting Rx for Humolog be sent to [facility] in [place]. Last refill was sent to [facility] instead. Initial call taken by: Crissie Sickles, CMA,  February 28, 2010 10:18 AM    Prescriptions: HUMALOG KWIKPEN 100 UNIT/ML  SOLN (INSULIN LISPRO (HUMAN)) 25 units at breakfast, 5 units with lunch, and 15 with supper  #1 month x 5   Entered by:   Crissie Sickles, CMA   Authorized by:   Donavan Foil MD   Signed by:   Crissie Sickles, CMA on 02/28/2010   Method used:   Electronically to        [facility] # [phone]* (retail)       [place], [place]  [address]       Ph: [phone] or [phone]       Fax: [phone]   RxID:   [id number]

## 2010-10-20 ENCOUNTER — Ambulatory Visit: Admit: 2010-10-20 | Payer: Self-pay | Admitting: Endocrinology

## 2010-10-20 ENCOUNTER — Ambulatory Visit: Payer: Self-pay | Admitting: Endocrinology

## 2010-10-31 ENCOUNTER — Encounter (INDEPENDENT_AMBULATORY_CARE_PROVIDER_SITE_OTHER): Payer: Self-pay | Admitting: *Deleted

## 2010-10-31 ENCOUNTER — Other Ambulatory Visit: Payer: Medicare Other

## 2010-10-31 ENCOUNTER — Encounter: Payer: Self-pay | Admitting: Endocrinology

## 2010-10-31 ENCOUNTER — Other Ambulatory Visit: Payer: Self-pay | Admitting: Endocrinology

## 2010-10-31 ENCOUNTER — Ambulatory Visit (INDEPENDENT_AMBULATORY_CARE_PROVIDER_SITE_OTHER): Payer: Medicare Other | Admitting: Endocrinology

## 2010-10-31 DIAGNOSIS — E119 Type 2 diabetes mellitus without complications: Secondary | ICD-10-CM

## 2010-10-31 LAB — HEMOGLOBIN A1C: Hgb A1c MFr Bld: 7.4 % — ABNORMAL HIGH (ref 4.6–6.5)

## 2010-11-09 NOTE — Assessment & Plan Note (Signed)
Summary: 3 MTH FU STC   Vital Signs:  Patient profile:   55 year old female Height:      66 inches (167.64 cm) Weight:      215 pounds (97.73 kg) BMI:     34.83 O2 Sat:      98 % on Room air Temp:     99.1 degrees F (37.28 degrees C) Pulse rate:   67 / minute Pulse rhythm:   regular BP sitting:   114 / 68  (left arm) Cuff size:   large  Vitals Entered By: Rebeca Alert CMA ([place]) (October 31, 2010 1:29 PM)  O2 Flow:  Room air CC: 3 month F/U/aj Comments Pt declined flu shot, has never had a colonoscopy and is due for mammogram and yearly pap   Referring Provider:  Marlaine Hind, MD Primary Provider:  Rory Percy  CC:  3 month F/U/aj.  History of Present Illness: pt states she feels well in general.  no cbg record, but states cbg's are sometimes low before the evening meal (which is her 2nd meal of the day).  it is still highest before breakfast (200).     Current Medications (verified): 1)  Humulin N Pen 100 Unit/ml Susp (Insulin Isophane Human) .... Inject 25 Units Subcutaneously At Bedtime 2)  Lipitor 80 Mg Tabs (Atorvastatin Calcium) .... Take One Tablet By Mouth Daily. 3)  Metoprolol Tartrate 25 Mg Tabs (Metoprolol Tartrate) .... Take One Tablet By Mouth Twice A Day 4)  Plavix 75 Mg  Tabs (Clopidogrel Bisulfate) .... Take 1 By Mouth Qd 5)  Nitrostat 0.4 Mg Subl (Nitroglycerin) .... Dissolve One Tablet Under Tongue For Severe Chest Pain As Needed Every 5 Minutes, Not To Exceed 3 in 15 Min Time Frame 6)  Humalog Kwikpen 100 Unit/ml  Soln (Insulin Lispro (Human)) .... 20 Units At Breakfast, 5 Units With Lunch, and 20 With Supper 7)  Accupril 5 Mg  Tabs (Quinapril Hcl) .... Take 1 By Mouth Qd 8)  Isosorbide Dinitrate 20 Mg Tabs (Isosorbide Dinitrate) .... Take 3 Tablets By Mouth Two Times A Day 9)  Bayer Aspirin 325 Mg  Tabs (Aspirin) .Marland Kitchen.. 1 By Mouth Once Daily 10)  Colchicine 0.6 Mg  Tabs (Colchicine) .Marland Kitchen.. 1 By Mouth Once Daily As Needed For Gout 11)  Bd U/f Short Pen Needle  31g X 8 Mm Misc (Insulin Pen Needle) .... Inject Three Times A Day 12)  Furosemide 40 Mg Tabs (Furosemide) .Marland Kitchen.. 1 By Mouth Once Daily  Allergies (verified): 1)  Amoxicillin (Amoxicillin) 2)  Penicillin  Past History:  Past Medical History: Last updated: 01/27/2010 CAD...cutting balloon angioplasty to vein graft... December, 2007 /  catheterization  January 05, 2010... PCI.  In-stent restenosis and the anastomosis of the SVG to OM Small bowel obstruction... January 07, 2010   ( days after PCI)... medical treatment... plan abdominal surgery later Groin bleed.... significant... PCI December, 2007 CABG  2005... LIMA LAD, SVG OM, SVG right coronary artery Diabetes mellitus, type II Hyperlipidemia Hypertension Renal failure gout right leg discomfort.... chronic EF 60%... echo... December 2 010 Asthma Overweight  Review of Systems  The patient denies syncope.    Physical Exam  General:  obese.  no distress  Pulses:  dorsalis pedis intact bilat. Extremities:  no deformity.  no ulcer on the feet.  feet are of normal color, but are cool to touch.  no edema  Neurologic:  sensation is intact to touch on the feet    Impression & Recommendations:  Problem #  1:  DIABETES MELLITUS, TYPE II (ICD-250.00) she needs some adjustment in her therapy  Medications Added to Medication List This Visit: 1)  Humulin N Pen 100 Unit/ml Susp (Insulin isophane human) .... Inject 30 units subcutaneously at bedtime 2)  Humalog Kwikpen 100 Unit/ml Soln (Insulin lispro (human)) .Marland Kitchen.. 15 units at breakfast, 5 units with lunch, and 20 with supper  Other Orders: TLB-A1C / Hgb A1C (Glycohemoglobin) (83036-A1C) Est. Patient Level III [id number]  Patient Instructions: 1)  return 3 mos 2)  check your blood sugar 3 times a day.  vary the time of day when you check, between before the 3 meals, and at bedtime.  also check if you have symptoms of your blood sugar being too high or too low.  please keep a record of  the readings and bring it to your next appointment here.  please call us sooner if you are having low blood sugar episodes. 3)  tests are being ordered for you today.  a few days after the test(s), please call [phone] to hear your test results. 4)  pending the test results, please decrease humalog to (just before each meal) 15-5-20 units, and  increase nph to 30 units at bedtime.   Orders Added: 1)  TLB-A1C / Hgb A1C (Glycohemoglobin) [83036-A1C] 2)  Est. Patient Level III [id number]

## 2010-12-04 LAB — DIFFERENTIAL
Basophils Absolute: 0 10*3/uL (ref 0.0–0.1)
Basophils Relative: 0 % (ref 0–1)
Eosinophils Absolute: 0.1 10*3/uL (ref 0.0–0.7)
Eosinophils Relative: 2 % (ref 0–5)
Lymphocytes Relative: 22 % (ref 12–46)

## 2010-12-04 LAB — GLUCOSE, CAPILLARY
Glucose-Capillary: 102 mg/dL — ABNORMAL HIGH (ref 70–99)
Glucose-Capillary: 107 mg/dL — ABNORMAL HIGH (ref 70–99)
Glucose-Capillary: 159 mg/dL — ABNORMAL HIGH (ref 70–99)
Glucose-Capillary: 164 mg/dL — ABNORMAL HIGH (ref 70–99)
Glucose-Capillary: 265 mg/dL — ABNORMAL HIGH (ref 70–99)
Glucose-Capillary: 273 mg/dL — ABNORMAL HIGH (ref 70–99)
Glucose-Capillary: 63 mg/dL — ABNORMAL LOW (ref 70–99)
Glucose-Capillary: 67 mg/dL — ABNORMAL LOW (ref 70–99)
Glucose-Capillary: 73 mg/dL (ref 70–99)
Glucose-Capillary: 80 mg/dL (ref 70–99)

## 2010-12-04 LAB — BASIC METABOLIC PANEL
BUN: 32 mg/dL — ABNORMAL HIGH (ref 6–23)
GFR calc non Af Amer: 32 mL/min — ABNORMAL LOW (ref >60)
Glucose, Bld: 75 mg/dL (ref 70–99)
Potassium: 4.5 mEq/L (ref 3.5–5.1)

## 2010-12-04 LAB — CBC
HCT: 29.3 % — ABNORMAL LOW (ref 36.0–46.0)
MCHC: 34.4 g/dL (ref 30.0–36.0)
MCV: 89.4 fL (ref 78.0–100.0)
Platelets: 154 10*3/uL (ref 150–400)
RDW: 13.3 % (ref 11.5–15.5)

## 2010-12-05 LAB — BASIC METABOLIC PANEL
CO2: 27 mEq/L (ref 19–32)
Glucose, Bld: 79 mg/dL (ref 70–99)
Potassium: 4 mEq/L (ref 3.5–5.1)
Sodium: 140 mEq/L (ref 135–145)

## 2010-12-05 LAB — CBC
HCT: 37.8 % (ref 36.0–46.0)
Hemoglobin: 13.1 g/dL (ref 12.0–15.0)
MCHC: 34.7 g/dL (ref 30.0–36.0)
RBC: 4.27 MIL/uL (ref 3.87–5.11)
RDW: 13.2 % (ref 11.5–15.5)

## 2010-12-05 LAB — DIFFERENTIAL
Basophils Absolute: 0.1 10*3/uL (ref 0.0–0.1)
Basophils Relative: 1 % (ref 0–1)
Eosinophils Relative: 2 % (ref 0–5)
Lymphocytes Relative: 37 % (ref 12–46)
Monocytes Absolute: 0.8 10*3/uL (ref 0.1–1.0)
Monocytes Relative: 9 % (ref 3–12)

## 2010-12-06 LAB — GLUCOSE, CAPILLARY
Glucose-Capillary: 103 mg/dL — ABNORMAL HIGH (ref 70–99)
Glucose-Capillary: 143 mg/dL — ABNORMAL HIGH (ref 70–99)
Glucose-Capillary: 170 mg/dL — ABNORMAL HIGH (ref 70–99)
Glucose-Capillary: 176 mg/dL — ABNORMAL HIGH (ref 70–99)
Glucose-Capillary: 181 mg/dL — ABNORMAL HIGH (ref 70–99)
Glucose-Capillary: 188 mg/dL — ABNORMAL HIGH (ref 70–99)
Glucose-Capillary: 204 mg/dL — ABNORMAL HIGH (ref 70–99)
Glucose-Capillary: 221 mg/dL — ABNORMAL HIGH (ref 70–99)
Glucose-Capillary: 252 mg/dL — ABNORMAL HIGH (ref 70–99)
Glucose-Capillary: 308 mg/dL — ABNORMAL HIGH (ref 70–99)
Glucose-Capillary: 62 mg/dL — ABNORMAL LOW (ref 70–99)
Glucose-Capillary: 98 mg/dL (ref 70–99)

## 2010-12-06 LAB — BASIC METABOLIC PANEL
BUN: 34 mg/dL — ABNORMAL HIGH (ref 6–23)
BUN: 38 mg/dL — ABNORMAL HIGH (ref 6–23)
BUN: 40 mg/dL — ABNORMAL HIGH (ref 6–23)
CO2: 30 mEq/L (ref 19–32)
Calcium: 9.8 mg/dL (ref 8.4–10.5)
Calcium: 9.8 mg/dL (ref 8.4–10.5)
Chloride: 100 mEq/L (ref 96–112)
Chloride: 100 mEq/L (ref 96–112)
Chloride: 103 mEq/L (ref 96–112)
Creatinine, Ser: 1.34 mg/dL — ABNORMAL HIGH (ref 0.4–1.2)
Creatinine, Ser: 1.46 mg/dL — ABNORMAL HIGH (ref 0.4–1.2)
GFR calc Af Amer: 46 mL/min — ABNORMAL LOW (ref >60)
GFR calc Af Amer: 48 mL/min — ABNORMAL LOW (ref >60)
GFR calc non Af Amer: 38 mL/min — ABNORMAL LOW (ref >60)
GFR calc non Af Amer: 40 mL/min — ABNORMAL LOW (ref >60)
GFR calc non Af Amer: 42 mL/min — ABNORMAL LOW (ref >60)
Glucose, Bld: 151 mg/dL — ABNORMAL HIGH (ref 70–99)
Potassium: 4.1 mEq/L (ref 3.5–5.1)
Sodium: 136 mEq/L (ref 135–145)
Sodium: 138 mEq/L (ref 135–145)

## 2010-12-06 LAB — URINALYSIS, ROUTINE W REFLEX MICROSCOPIC
Glucose, UA: NEGATIVE mg/dL
Hgb urine dipstick: NEGATIVE
Ketones, ur: NEGATIVE mg/dL
Protein, ur: NEGATIVE mg/dL
pH: 5 (ref 5.0–8.0)

## 2010-12-06 LAB — COMPREHENSIVE METABOLIC PANEL
ALT: 21 U/L (ref 0–35)
ALT: 25 U/L (ref 0–35)
AST: 20 U/L (ref 0–37)
AST: 29 U/L (ref 0–37)
Albumin: 3.1 g/dL — ABNORMAL LOW (ref 3.5–5.2)
Albumin: 3.9 g/dL (ref 3.5–5.2)
Alkaline Phosphatase: 70 U/L (ref 39–117)
BUN: 23 mg/dL (ref 6–23)
CO2: 27 mEq/L (ref 19–32)
Calcium: 8.6 mg/dL (ref 8.4–10.5)
Calcium: 9.4 mg/dL (ref 8.4–10.5)
Chloride: 104 mEq/L (ref 96–112)
Chloride: 106 mEq/L (ref 96–112)
Chloride: 106 mEq/L (ref 96–112)
Creatinine, Ser: 1.5 mg/dL — ABNORMAL HIGH (ref 0.4–1.2)
Creatinine, Ser: 1.57 mg/dL — ABNORMAL HIGH (ref 0.4–1.2)
GFR calc Af Amer: 42 mL/min — ABNORMAL LOW (ref >60)
GFR calc Af Amer: 44 mL/min — ABNORMAL LOW (ref >60)
GFR calc non Af Amer: 37 mL/min — ABNORMAL LOW (ref >60)
Glucose, Bld: 124 mg/dL — ABNORMAL HIGH (ref 70–99)
Potassium: 4.1 mEq/L (ref 3.5–5.1)
Sodium: 138 mEq/L (ref 135–145)
Sodium: 139 mEq/L (ref 135–145)
Total Bilirubin: 0.7 mg/dL (ref 0.3–1.2)
Total Bilirubin: 0.8 mg/dL (ref 0.3–1.2)
Total Protein: 6.2 g/dL (ref 6.0–8.3)
Total Protein: 7.2 g/dL (ref 6.0–8.3)

## 2010-12-06 LAB — CBC
HCT: 32.1 % — ABNORMAL LOW (ref 36.0–46.0)
HCT: 35.2 % — ABNORMAL LOW (ref 36.0–46.0)
Hemoglobin: 11.3 g/dL — ABNORMAL LOW (ref 12.0–15.0)
Hemoglobin: 12.3 g/dL (ref 12.0–15.0)
Hemoglobin: 13.1 g/dL (ref 12.0–15.0)
Hemoglobin: 14.4 g/dL (ref 12.0–15.0)
MCHC: 35 g/dL (ref 30.0–36.0)
MCHC: 35.3 g/dL (ref 30.0–36.0)
MCHC: 35.9 g/dL (ref 30.0–36.0)
MCV: 86.9 fL (ref 78.0–100.0)
MCV: 87.2 fL (ref 78.0–100.0)
MCV: 87.3 fL (ref 78.0–100.0)
MCV: 87.6 fL (ref 78.0–100.0)
MCV: 88 fL (ref 78.0–100.0)
Platelets: 182 10*3/uL (ref 150–400)
Platelets: 215 10*3/uL (ref 150–400)
Platelets: 231 10*3/uL (ref 150–400)
RBC: 3.69 MIL/uL — ABNORMAL LOW (ref 3.87–5.11)
RBC: 4 MIL/uL (ref 3.87–5.11)
RBC: 4.17 MIL/uL (ref 3.87–5.11)
RBC: 4.32 MIL/uL (ref 3.87–5.11)
RBC: 4.66 MIL/uL (ref 3.87–5.11)
RDW: 12.6 % (ref 11.5–15.5)
RDW: 12.7 % (ref 11.5–15.5)
RDW: 12.9 % (ref 11.5–15.5)
RDW: 13.2 % (ref 11.5–15.5)
WBC: 12.3 10*3/uL — ABNORMAL HIGH (ref 4.0–10.5)
WBC: 15.6 10*3/uL — ABNORMAL HIGH (ref 4.0–10.5)
WBC: 16.4 10*3/uL — ABNORMAL HIGH (ref 4.0–10.5)
WBC: 19.6 10*3/uL — ABNORMAL HIGH (ref 4.0–10.5)
WBC: 8.8 10*3/uL (ref 4.0–10.5)

## 2010-12-06 LAB — POCT CARDIAC MARKERS
CKMB, poc: 1.2 ng/mL (ref 1.0–8.0)
Myoglobin, poc: 77.5 ng/mL (ref 12–200)
Myoglobin, poc: 79.1 ng/mL (ref 12–200)
Troponin i, poc: <0.05 ng/mL (ref 0.00–0.09)

## 2010-12-06 LAB — DIFFERENTIAL
Basophils Absolute: 0 10*3/uL (ref 0.0–0.1)
Basophils Relative: 0 % (ref 0–1)
Eosinophils Absolute: 0.1 10*3/uL (ref 0.0–0.7)
Eosinophils Relative: 0 % (ref 0–5)
Eosinophils Relative: 1 % (ref 0–5)
Lymphocytes Relative: 20 % (ref 12–46)
Lymphs Abs: 4 10*3/uL (ref 0.7–4.0)
Monocytes Absolute: 0.7 10*3/uL (ref 0.1–1.0)
Monocytes Absolute: 1.3 10*3/uL — ABNORMAL HIGH (ref 0.1–1.0)
Monocytes Relative: 5 % (ref 3–12)
Monocytes Relative: 7 % (ref 3–12)
Neutro Abs: 9.6 10*3/uL — ABNORMAL HIGH (ref 1.7–7.7)

## 2010-12-06 LAB — URINE CULTURE
Colony Count: NO GROWTH
Culture: NO GROWTH

## 2010-12-06 LAB — TROPONIN I: Troponin I: 0.06 ng/mL (ref 0.00–0.06)

## 2010-12-06 LAB — APTT: aPTT: 27 seconds (ref 24–37)

## 2010-12-06 LAB — PROTIME-INR
INR: 1.03 (ref 0.00–1.49)
Prothrombin Time: 13.4 seconds (ref 11.6–15.2)

## 2010-12-06 LAB — CK TOTAL AND CKMB (NOT AT ARMC)
CK, MB: 2.3 ng/mL (ref 0.3–4.0)
Total CK: 35 U/L (ref 7–177)

## 2011-01-02 LAB — BASIC METABOLIC PANEL
CO2: 26 mEq/L (ref 19–32)
Calcium: 9.5 mg/dL (ref 8.4–10.5)
GFR calc Af Amer: 45 mL/min — ABNORMAL LOW (ref >60)
GFR calc non Af Amer: 37 mL/min — ABNORMAL LOW (ref >60)
Potassium: 4 mEq/L (ref 3.5–5.1)
Sodium: 137 mEq/L (ref 135–145)

## 2011-01-02 LAB — CBC
MCV: 85.3 fL (ref 78.0–100.0)
Platelets: 178 10*3/uL (ref 150–400)
RBC: 4.57 MIL/uL (ref 3.87–5.11)
WBC: 9.9 10*3/uL (ref 4.0–10.5)

## 2011-01-02 LAB — POCT CARDIAC MARKERS
CKMB, poc: 1.1 ng/mL (ref 1.0–8.0)
Myoglobin, poc: 116 ng/mL (ref 12–200)
Myoglobin, poc: 126 ng/mL (ref 12–200)

## 2011-01-02 LAB — PROTIME-INR
INR: 1 (ref 0.00–1.49)
Prothrombin Time: 12.8 seconds (ref 11.6–15.2)

## 2011-01-02 LAB — APTT: aPTT: 31 seconds (ref 24–37)

## 2011-01-02 LAB — DIFFERENTIAL
Lymphocytes Relative: 34 % (ref 12–46)
Lymphs Abs: 3.4 10*3/uL (ref 0.7–4.0)
Monocytes Relative: 7 % (ref 3–12)
Neutro Abs: 5.6 10*3/uL (ref 1.7–7.7)
Neutrophils Relative %: 57 % (ref 43–77)

## 2011-01-31 ENCOUNTER — Ambulatory Visit: Payer: Medicare Other | Admitting: Endocrinology

## 2011-01-31 NOTE — Consult Note (Signed)
[facility]                          ENDOCRINOLOGY CONSULTATION   Donna Howe, Donna Howe Donna Howe                     MRN:          [id number]  DATE:03/27/2007                            DOB:          03-11-1956    REASON FOR VISIT:  Followup diabetes.   HISTORY OF PRESENT ILLNESS:  A 55 year old woman who is now on q.a.c.  Humalog 35 breakfast, 30 lunch and 35 at supper.  She states her  glucose's are lowest before supper and at [facility]. and are highest in the  morning.  She states her glucose's are all in the 100's except they are  over 200 in the morning, despite not eating any H.S. snacks.   SOCIAL HISTORY:  She states the cost of her medication is the primary  consideration.   REVIEW OF SYSTEMS:  Chronic slow weight gain.  She denies hypoglycemia.   PHYSICAL EXAMINATION:  VITAL SIGNS:  Blood pressure 116/69.  Heart rate  65.  Temperature 97.1.  Weight is 255.  GENERAL APPEARANCE:  No distress.  Does not appear anxious nor  depressed.   IMPRESSION:  Insulin-requiring type 2 diabetes.  The pattern of  glucose's she reports indicates that she needs insulin at bedtime.  NPH  Insulin is chosen because she states cost is the primary consideration.   PLAN:  1. Decrease q.a.c. Humalog to 35-25-30.  2. Add NPH Insulin 10 units q.h.s.  3. Increase the NPH Insulin until your morning glucose's are in the      low 100's.  4. Return 30 days.     Sean A. Loanne Drilling, MD  Electronically Signed    SAE/MedQ  DD: 03/31/2007  DT: 04/01/2007  Job #: [id number]   cc:   Rory Percy

## 2011-01-31 NOTE — Consult Note (Signed)
[place]   Nikkole, Gilster SHAQUANIA KEELAN                     MRN:          [id number]  DATE:01/17/2007                            DOB:          [birth year]    REFERRING PHYSICIAN:  Rory Percy   REASON FOR REFERRAL:  Diabetes.   HISTORY OF PRESENT ILLNESS:  A 55 year old woman who reports an 8-year  history of diabetes with several serious complications.  She has never  required insulin.  She was well controlled on three oral agents, but  several had to be discontinued due to her other medical conditions.  Since then, her glucoses and A1C have steadily increased.  She is now  only taking Glipizide for diabetes.  Symptomatically, she has a 50 pound  weight gain in the past three years.  She has severe weight gain of 50  pounds in the past three years.  She also has some left foot pain that  Dr. Nadara Mustard is working on, but she has no associated numbness there.   PAST MEDICAL HISTORY:  1. CAD.  2. Chronic renal insufficiency.  3. Dyslipidemia.  4. Hypertension.   SOCIAL HISTORY:  She is disabled.  She is here with her boyfriend.   FAMILY HISTORY:  Positive for diabetes in her mother.   REVIEW OF SYSTEMS:  She denies hypoglycemia.  She does have some fatigue  and a DOE.   PHYSICAL EXAMINATION:  VITAL SIGNS:  Blood pressure 121/64, heart rate  60, temperature 97.2, weight 255.  GENERAL:  Obese, no distress.  SKIN:  Not diaphoretic.  No rash.  HEENT:  No proptosis.  No periorbital swelling.  Pharynx is normal.  NECK:  No goiter.  CHEST:  Clear to auscultation.  No respiratory distress.  CARDIOVASCULAR:  No edema.  Regular rate and rhythm.  No murmur.  Pedal  pulses are intact.  There is no bruits at the carotid arteries.  EXTREMITIES:  Feet, normal color and temperature.  There is no ulcer  present on the feet.  NEUROLOGICAL:  Alert and oriented.  Does not appear anxious or  depressed.  Sensation  is intact to touch on the feet.   LABORATORY DATA:  Laboratory studies forwarded by Dr. Nadara Mustard on December 21, 2006, hemoglobin A1C 12.1, total cholesterol 366, triglycerides 510,  creatinine 2.3.   IMPRESSION:  1. Type 2 diabetes.  She has failed oral agents through the failure of      the Glipizide to control her glucose and the      contraindication/intolerance of other agents.  2. She has several serious chronic complications.  3. Her elevated triglycerides would probably also improve with      improvement in her glycemic control.  4. Left foot pain that Dr. Nadara Mustard is working on.   PLAN:  1. We discussed the importance of diet and exercise therapy and the      risks of diabetes.  2. I told her that insulin is the only safe and effective medication      for her diabetes now.  3. She is referred to Leonia Reader, CDE,  to learn insulin      injections.  She will start with Humalog or NovoLog 5 units t.i.d.      (q.a.c.).  She will increase this p.r.n. her glucoses and return      here in two weeks.  4. I told her it is possible she may need basal insulin also, but this      possible need would become manifest through the pattern in her home      glucose record that she will bring when she comes back in a few      weeks.     Sean A. Loanne Drilling, MD  Electronically Signed    SAE/MedQ  DD: 01/17/2007  DT: 01/17/2007  Job #: [id number]   cc:   Rory Percy

## 2011-02-03 NOTE — Cardiovascular Report (Signed)
NAMEPRANSHI, Donna Howe NO.:  [id number]   MEDICAL RECORD NO.:  [id number]                   PATIENT TYPE:  OIB   LOCATION:  6523                                 FACILITY:  [facility]   PHYSICIAN:  Junious Silk, M.D. [facility]         DATE OF BIRTH:  November 02, 1955   DATE OF PROCEDURE:  03/24/2004  DATE OF DISCHARGE:  03/25/2004                              CARDIAC CATHETERIZATION   PROCEDURES PERFORMED:  1. Left heart catheterization with coronary angiography, bypass angiography,     and left ventricular angiography.  2. Percutaneous transluminal coronary angioplasty with placement of a drug-     eluting stent in the left main coronary artery and proximal left     circumflex coronary artery.  3. Percutaneous transluminal coronary angioplasty utilizing cutting balloon     of the ostium of the left anterior descending artery.  4. Percutaneous transluminal coronary angioplasty utilizing a cutting     balloon in the proximal first obtuse marginal branch.   INDICATIONS:  Donna Howe is a 55 year old woman with a history of coronary  artery bypass surgery in February of this year after presenting with an  acute coronary syndrome.  At that time she had significant ostial left main,  left anterior descending artery, and left circumflex disease as well as  ostial right coronary artery disease.  She represented two months later with  unstable angina and acute coronary syndrome.  Cardiac catheterization at  that time revealed a patent left internal mammary artery to the distal LAD.  There had been a sequential saphenous vein graft to the obtuse marginal  branch and the posterolateral branch.  The distal limb of this vein graft  was occluded.  There was severe disease at the anastomosis of this vein  graft with the obtuse marginal with disease extending both distally and to  the obtuse marginally and proximally to the anastomosis.  There was a patent  vein graft to the  mid-right coronary artery.  The patient was evaluated by  Dr. Cyndia Bent and felt not to be a candidate for redo surgery at that time.  We  therefore performed angioplasty of the distal anastomosis with a vein graft  to the first obtuse marginal utilizing kissing balloons, treating the obtuse  marginal both distal and proximal to the anastomosis.  She represented to  the office with symptoms of recurrent progressive chest pain.  She was thus  referred for repeat cardiac catheterization.   CATHETERIZATION PROCEDURAL NOTE:  A 6 French sheath was placed in the right  femoral artery.  The left coronary arteries were imaged with a JL4 catheter.  The right coronary artery was imaged with a no-torque right catheter.  The  saphenous vein graft to the right coronary artery was imaged with an RCB  catheter.  The saphenous vein graft to the obtuse marginal was imaged with a  JR4 catheter.  The left internal mammary artery to the LAD  was imaged with  an internal mammary catheter.  Left ventriculography was performed with an  angled pigtail catheter.  Contrast was Omnipaque.  There were no  complications.   RESULTS:   HEMODYNAMICS:  1. Left ventricular pressure 164/18.  2. Aortic pressure 170/90.  3. There was no aortic valve gradient.   LEFT VENTRICULOGRAM:  There is very mild focal akinesis of the inferior wall  toward the base.  Otherwise wall motion is normal. Ejection fraction is  estimated at greater than or equal to 60%.   CORONARY ARTERIOGRAPHY (LEFT-DOMINANT):  1. Left main has a 95% stenosis beginning in its ostium and extending     through the length of the left main coronary artery.  The left main is a     relatively short vessel.  2. The left anterior descending artery has a 90% stenosis at its ostium.  In     the mid-LAD there is a diffuse 40%.  In the distal LAD proximal to the     internal mammary artery insertion there is a diffuse 70% stenosis.  The     LAD gives rise to two  normal-sized diagonal branches.  The distal left     anterior descending fills the left internal mammary graft.  3. The left circumflex is a dominant vessel.  It gives rise to a large first     obtuse marginal branch, a normal-sized bifurcating first posterolateral,     small second posterolateral branch, and a normal-sized posterior     descending artery.  There is a diffuse 90% stenosis in the proximal left     circumflex beginning in the left main and extending across the origin of     the first obtuse marginal branch.  The first obtuse marginal branch     itself has a 75% stenosis proximally.  The distal marginal fills the     saphenous vein graft; however, there is significant disease at the     anastomosis as described below.  The first posterolateral branch has a     95% stenosis in a medial branch, which had been previously grafted.  This     appears to be at the site where the second distal anastomosis of the     sequential graft was.  The vessel beyond this is very small in caliber,     being less than 2 mm in diameter.  4. The right coronary artery is a nondominant vessel; however, it is fairly     large-caliber.  There is an 80% stenosis in the ostium of the right     coronary artery.  The distal right coronary artery fills via saphenous     vein graft.  5. Left internal mammary artery to the distal LAD is patent throughout its     course, filling the mid- and distal LAD.  There is disease proximal to     the mammary insertion as described above.  6. There is a sequential saphenous vein graft to the first obtuse marginal     branch and the first posterolateral branch.  The distal limit of this     graft is 100% occluded, as was seen on previous films.  At the distal     anastomosis of this graft there is a 95% extending into the distal obtuse     marginal and 80% extending retrograde near the proximal obtuse marginal    branch.  This is the previous angioplasty site.  7. The  saphenous  vein graft to the mid-right coronary artery is patent     throughout its course.   IMPRESSION:  1. Preserved left ventricular systolic function with mild focal akinesis of     the inferior wall but overall normal ejection fraction.  2. Native three-vessel coronary artery disease.  3. Status post coronary artery bypass graft surgery.  There is a patent left     internal mammary artery to the left anterior descending artery and a     patent vein graft to the right coronary artery.  There is significant     restenosis at the distal anastomosis of the saphenous vein graft to the     first obtuse marginal branch as well as occlusion of the distal limb of     this vein graft, which was anastomosed to the posterolateral branch.     There is critical disease in the left main and the proximal left     circumflex, jeopardizing the entire left circumflex system.   PLAN:  These results were discussed at length with the patient and her  family.  As she is at this point still not a surgical candidate, we  discussed options of attempt at percutaneous intervention of the left main  coronary artery, the left anterior descending artery and first obtuse  marginal branch versus continued medical therapy and re-evaluation for CABG  in the future.  At this point the patient and family have opted to proceed  with percutaneous coronary intervention.   PERCUTANEOUS TRANSLUMINAL CORONARY ANGIOPLASTY PROCEDURAL NOTE:  Following  completion of diagnostic catheterization, we __________ directly with  percutaneous coronary intervention after the above discussion.  We placed a  5 French sheath in the left femoral artery as a back-up in case intra-aortic  balloon pump support would be required.  We then exchanged our 6 French  sheath in the right femoral artery over a wire for an 8 [place] sheath.  Heparin and Integrilin were administered per protocol.  We used an 8 [place] guiding catheter.  An Asahi  soft coronary guidewire was advanced under  fluoroscopic guidance into the distal left circumflex.  We then advanced a  second Asahi soft wire into the first obtuse marginal branch.  We advanced a  third Asahi soft wire into the left anterior descending artery.  We then  performed PTCA of the left main and proximal left circumflex coronary  arteries with a 3.0 x 15 mm Maverick balloon inflated to 12 atmospheres.  This did result in improvement in the vessel lumen and with improved flow.  We then performed PTCA of the ostium of the left anterior descending artery  through a 3.0 x 10 mm cutting balloon.  We performed three inflations to 6,  10, and 10 atmospheres, respectively.  This resulted in significant  improvement in the ostium of the left anterior descending artery.  We then  performed PTCA of the proximal and ostial first obtuse marginal branch with a 2.5 x 10 mm cutting balloon for three inflations to 6 atmospheres.  This  resulted in improvement in this vessel as well.  At this point we advanced a  3.0 x 20 mm Taxus drug-eluting stent over our wire in the circumflex.  We  positioned the stent in the left main coronary artery, taking care to  position the proximal portion of the stent at the very ostium of the left  main coronary artery.  This stent did extend into the proximal circumflex  across the origin  of the first obtuse marginal branch.  The guidewires from  the left anterior descending artery and the obtuse marginal were then  removed.  We then deployed the stent at a deployment pressure of 16  atmospheres.  Following this we readvanced our guidewire into the left  anterior descending artery.  We then performed PTCA of the left anterior  descending artery with a 3.0 x 15 mm Quantum balloon inflated to 14  atmospheres.  We then went in with a 3.5 x 15 mm Quantum balloon and  positioned this within the distal aspect of the stent in the left circumflex  and inflated it to 16  atmospheres.  We then positioned this wound in the  proximal aspect of the stent in the left main and proximal circumflex and  inflated the balloon to 18 atmospheres.  Following this we readvanced our  3.0 x 15 mm Quantum balloon into the left anterior descending artery and  performed kissing balloon angioplasty of the left anterior descending  artery, left main, and left circumflex coronary arteries.  The 3.0 x 15 mm  Maverick balloon in the left anterior descending artery was inflated to 8  atmospheres and the 3.0 x 15 mm Maverick balloon in the left main and left  circumflex was inflated to 8 atmospheres.  Intermittent doses of  nitroglycerin were administered.  Final angiographic images were obtained,  revealing patency of all PTCA sites.  There is 0% residual stenosis within  the stented segment of the left main and the proximal left circumflex with  TIMI-3 flow.  There is approximately 40% residual stenosis in the ostium of  the left anterior descending artery with TIMI-3 flow.  There was  approximately 30% stenosis in the proximal and ostial obtuse marginal with  TIMI-3 flow.   COMPLICATIONS:  None.   RESULTS:  Complex but successful PTCA with placement of a drug-eluting stent  in the left main coronary artery and the proximal left circumflex coronary  artery.  A 95% stenosis in the left main and proximal left circumflex was  reduced to 0% residual with TIMI-3 flow.  The ostium of the left anterior  descending artery was treated with cutting balloon angioplasty and kissing  balloon angioplasty and was reduced from 90% stenosis to 40% residual with  TIMI-3 flow.  The proximal obtuse marginal branch was treated with cutting  balloon angioplasty and reduced from 75% stenosis to 30% residual, TIMI-3  flow.   PLAN:  Integrilin will be continued overnight.  The patient will be continued on Plavix for a recommended one year.  We will bring the patient  back in approximately two weeks  to treat additional anastomosis of the  saphenous vein graft to the first obtuse marginal branch.                                               Junious Silk, M.D. [facility]    MWP/MEDQ  D:  03/24/2004  T:  03/25/2004  Job:  [id number]   cc:   Rory Percy  [place], [place] [id number]  Fax: [place], [place]   Cardiac Catheterization Lab

## 2011-02-03 NOTE — H&P (Signed)
NAMEHANNAN, Donna Howe NO.:  [id number]   MEDICAL RECORD NO.:  [id number]          PATIENT TYPE:  INP   LOCATION:  [id number]                         FACILITY:  [facility]   PHYSICIAN:  Scarlett Presto, M.D.   DATE OF BIRTH:  08/11/1956   DATE OF ADMISSION:  10/04/2004  DATE OF DISCHARGE:                                HISTORY & PHYSICAL   PRIMARY CARE PHYSICIAN:  Rory Percy, M.D., [place]   CARDIOLOGIST:  Satira Sark, M.D.   HISTORY OF PRESENT ILLNESS:  [name] is a 55 year old female who has  severe coronary artery disease status post bypass surgery in February 2005.  She has subsequently undergone percutaneous revascularization with a stent  placed in her left main coronary artery who had a recent admission in  October 2005, for chest discomfort. She had a heart catheterization which  showed no obvious wheezing.  She underwent an Adenosine Cardiolite which  showed no obvious ischemia, and the decision was made to medically manage  her.  She has done well until approximately 24 hours ago when she was  walking at [place], developed discomfort in her chest, took a sublingual  nitroglycerin, had significant relief, came home, had another episode of  discomfort in her chest, took two sublingual nitroglycerin as this did not  resolve until she presented to [facility] in [name] where she received  both nasal cannula oxygen and a nitroglycerin drip.  She had resolution of  the pain.  Total duration of the pain was about 2-1/2 hours for the second  episode and less than 15 minutes than the first.  She denies any  diaphoresis, palpitations, radiation of pain.  It is a central pain in the  center of her chest, very characteristic of her previous angina.  She is  currently pain-free and feels reasonably well, resting comfortably in bed  after being transported from [name] by [name].   PAST MEDICAL HISTORY:  1.  Coronary artery disease.  She is  status post bypass surgery in February      2005, where she had a left internal mammary artery to her left anterior      descending, saphenous vein graft to a circumflex, jump graft to an      obtuse marginal, saphenous vein graft to the right coronary artery.  2.  She is status post catheterization most recently July 18, 2004, where      she had the following results.  Her left main was widely patent with a      stent that extended into the proximal circumflex coronary artery.  Her      left anterior descending has an ostial 95% stenosis.  There is a 70%      stenosis in the mid vessel.  The LIMA to the LAD is widely patent with      excellent back filling.  The circumflex is a large dominant vessel.  The      stent extends into the proximal portion of the vessel.  The first obtuse      marginal has a 90% stenosis proximally.  The saphenous vein graft to      this vessel is widely patent.  There is competitive flow and a modest      size branch which is proximal to the touchdown.  This is unchanged from      a previous catheterization.  She has multiple branches in the inferior      wall that are widely patent.  The right coronary artery is small and      nondominant.  The saphenous vein graft to this vessel is widely patent.      Her left subclavian is normal by angiography.  She had an echocardiogram      at that time which showed preserved left ventricular systolic function.      Adenosine Cardiolite was performed after this.  3.  She has diabetes mellitus without severe end organ damage other than the      coronary artery disease.  4.  She has hypertension which is difficult to control.  5.  She has hyperlipidemia.  6.  She has obesity.  7.  She has a previous history of tobacco abuse.   PAST SURGICAL HISTORY:  1.  She has had a cesarean section.  2.  Umbilical hernia repair.   ALLERGIES:  She is allergic to PENICILLIN.   CURRENT MEDICATIONS:  1.  Toprol 25 mg twice a day.   2.  Imdur 120 mg once a day.  3.  Plavix 75 mg once a day.  4.  Enteric-coated aspirin 325 mg once a day.  5.  Glipizide 10 mg once a day.  6.  Lipitor 40 mg once a day.  7.  Lisinopril 10 mg once a day.  8.  Omeprazole 20 mg once a day.  9.  Metformin 1000 mg once a day.   FAMILY HISTORY:  Both of her parents are living in their 38s.  Her mother  has a history of coronary artery disease, her father does not.  She has a  brother, however, who has had bypass surgery in his mid 42s.   SOCIAL HISTORY:  She lives in [name], [place] with her son.  She is not  currently employed.  She is divorced.  She has a greater than 50 pack year  smoking history and quit at the time of her bypass surgery and has not  restarted.  She denies any illicit drug use or alcohol intake.   REVIEW OF SYSTEMS:  Essentially unremarkable with the exception of some  significant shoulder pain.  She underwent an MRI exam two days prior to this  admission for concern for a rotator cuff tear.   PHYSICAL EXAMINATION:  VITAL SIGNS:  Blood pressure 140/60, pulse is 84,  respiratory rate 20.  She is saturation 90% on room air.  HEENT:  Examination is unremarkable.  NECK:  The neck is supple.  There is no jugular venous distention or carotid  bruits.  CHEST:  Clear to auscultation bilaterally.  CARDIOVASCULAR:  Point of maximal impulse is not palpable.  She has a normal  first and second heart sound.  No obvious murmurs.  ABDOMEN:  Soft, nontender, with normoactive bowel sounds.  EXTREMITIES:  No cyanosis, clubbing or edema.  Her distal pulses are 2+  throughout.  Previous catheterization site is well healed without any  obvious bruit.  SKIN:  Without rashes.  NEUROLOGICAL:  Exam is nonfocal.  MUSCULOSKELETAL:  Exam is unremarkable.   LABORATORY DATA:  Electrocardiogram obtained at  [facility] shows sinus  rhythm at a rate of 71 with nonspecific ST-T wave changes.  She has Q  waves in the inferior leads  consistent with an old inferior wall myocardial  infarction.  Her intervals are normal.  Her axis is normal.  Her laboratory  tests are B type natruretic peptide of 32.  Glucose is 188, BUN 23,  creatinine 1.0, calcium 9.8, total protein 8.3, albumin 4.3, alkaline  phosphatase 94 which is just at the upper limits of normal.  SGOT 14, total  bilirubin 0.3, SGPT 21.  Sodium 137, potassium 4.0, chloride 101,  bicarbonate 27.  One set of cardiac enzymes is not consistent with acute  myocardial infarction.  Her PT is 11.9, her PTT is 25.7.  White blood cell  count 10.6, H&H of 13 and 39 with a platelet count of 247,000 and normal  differential.  Chest x-ray is reported as normal, performed at [facility].   ASSESSMENT:  This is a lady with known severe coronary artery disease and  multiple other medical problems who is having chest pain which is  characteristic for her previous angina which is being controlled with  medical therapy currently.   PLAN:  My plan is to admit her, cycle her enzymes, continue her heparin and  nitroglycerin.  It is really unclear to me whether to proceed with heart  catheterization again or not.  It appears that she has been relatively  successful in revascularization in the past and that her burden of ischemia  on the previous Adenosine Cardiolite, which was done a few months ago, was  relatively small.  Therefore, I am going to defer to her primary  cardiologist, regarding further evaluation for this lady who I think has a  relatively complex situation.      Merry Proud   JH/MEDQ  D:  10/04/2004  T:  10/04/2004  Job:  [id number]

## 2011-02-03 NOTE — Cardiovascular Report (Signed)
NAMEGENEEN, LEDESMA              ACCOUNT NO.:  [id number]   MEDICAL RECORD NO.:  [id number]          PATIENT TYPE:  INP   LOCATION:  2807                         FACILITY:  [facility]   PHYSICIAN:  Vanna Scotland. Olevia Perches, MD, FACCDATE OF BIRTH:  [birth year]   DATE OF PROCEDURE:  08/28/2006  DATE OF DISCHARGE:                            CARDIAC CATHETERIZATION   CLINICAL HISTORY:  Ms. Michele is 55 years old and has had previous  bypass surgery and has had previous stenting of the left main into the  circumflex artery and previous stenting of the obtuse marginal vessel  just past the distal anastomosis of a vein graft.  She was admitted with  recurrent chest pain and scheduled for evaluation with angiography.  Her  creatinine was 2.1 and she was given the bicarbonate protocol.   PROCEDURE:  The procedure was perform via the right femoral artery using  arterial sheath and 6-French preformed coronary catheters.  We attempted  to minimize contrast and took only a few injections.  After completion  of the diagnostic study, we had used very little contrast and decided  that we could perform a percutaneous coronary intervention at the same  setting rather than stage the procedure.   The patient was given Angiomax bolus and infusion.  She was already on  Plavix.  We used an AL-1 6-French guiding catheter and a Prowater wire  and we crossed the lesion in the stent with the wire without difficulty.  We did this without contrast.  We then went in with a 2.75 x 6-mm  cutting balloon and performed a total of 4 inflations up to 10  atmospheres for 30 seconds.  Final diagnostic studies were then  performed through the guiding catheter.   The patient tolerated the procedure well and left the laboratory in  satisfactory condition.  The right femoral was not suitable for closure.   RESULTS:  The aortic pressure was 166/85 with mean of 119 and left  ventricular pressure was 166/25.   Left main coronary  artery:  The left main coronary artery had a stent  extending into the circumflex artery with 0% stenosis.   Circumflex artery:  The circumflex artery gave rise to a marginal  branch, 2 posterolateral branches and a posterior descending branch.  The distal portion of the stent in the proximal circumflex artery and  the distal vessels were free of significant disease.  There was 90%  ostial stenosis in the marginal branch with competing flow distally.   Left anterior descending artery:  The left anterior descending artery  had a 90% ostial stenosis.  This arose from the stent in the left main  and proximal circumflex artery and there was competing flow distally.   The native right coronary artery was not injected.   The saphenous vein graft to the native right coronary, which was a  nondominant vessel, was patent and functioned normally.   The saphenous vein graft to the marginal branch of the circumflex artery  was patent, but there was a 90% stenosis just at the distal anastomoses,  right at the proximal edge of the  Cypher stent.   The LIMA graft to the LAD was patent and functioned normally.   No left ventriculogram was performed.   Following cutting balloon angioplasty of the lesion in the saphenous  vein graft to the marginal branch of the circumflex artery at the  anastomosis, the stenosis improved from 90% to 10%.   CONCLUSION:  1. Coronary artery disease, status post previous coronary bypass      surgery and previous coronary interventions.  2. Significant native vessel disease with 0% stenosis at the stent      site in the left main and proximal circumflex artery, 90% stenosis      at the ostium of the left anterior descending artery with competing      flow distally, 90% stenosis at the ostium of the marginal branch of      the circumflex artery.  3. Patent vein graft to the nondominant right coronary artery, patent      left internal mammary artery graft to the left  anterior descending,      and patent vein graft to the marginal branch of the circumflex      artery with 90% stenosis at the distal anastomoses at the proximal      edge of the Cypher stent.  4. Successful percutaneous transluminal coronary angioplasty of the      anastomotic lesion of the saphenous vein graft to marginal branch      of the circumflex artery at the edge of the Cypher stent with      improvement in percentage in area of narrowing from 90% to 10% with      cutting balloon angioplasty.   DISPOSITION:  The patient was returned to the post-angioplasty suite for  further observation.      Bruce Alfonso Patten Olevia Perches, MD, [facility]  Electronically Signed     BRB/MEDQ  D:  08/28/2006  T:  08/29/2006  Job:  [id number]   cc:   Geanie Cooley, MD  Ernestine Mcmurray, [place]  [facility]iop[facility]

## 2011-02-03 NOTE — Discharge Summary (Signed)
Donna Howe, NEVEU NO.:  [id number]   MEDICAL RECORD NO.:  [id number]          PATIENT TYPE:  INP   LOCATION:  4739                         FACILITY:  [facility]   PHYSICIAN:  Junious Silk, M.D. LHCDATE OF BIRTH:  03/03/1956   DATE OF ADMISSION:  07/16/2004  DATE OF DISCHARGE:  07/20/2004                           DISCHARGE SUMMARY - REFERRING   [name] is a 55 year old female who was admitted to [facility]  complaining of left-sided chest pain which started the night prior to  admission.  She rated the pain as a 9/10 accompanied with shortness of  breath, nausea, and vomiting.  She initially went to [facility], and  sublingual nitroglycerin x3 relieved her pain.  She was then transferred to  [facility] for further evaluation.   Eventually, she did under cardiac catheterization secondary to ruling in for  a subendocardial myocardial infarction.  Her catheterization results are as  follows:  The saphenous vein graft to the OM was angiographically normal.  There is _________ at the OM-1 and LAD, both of which are jailed.  At this  point, an adenosine Cardiolite was obtained to assure that this was the area  of ischemia.  The results revealed a fixed anterior defect as prior to  admission.  There may be a subtle small focus of reversibility as additional  anterior wall was normal.  At this point, we decided to maximize her with  medical therapy, and we will follow her very closely.   DISCHARGE MEDICATIONS:  1.  Metoprolol 25 mg b.i.d.  2.  Imdur 60 mg a day.  3.  Plavix 75 mg a day.  4.  Enteric-coated aspirin 325 mg a day.  5.  She is to continue on with her medications as prior to admission which      include:      1.  Glipizide.      2.  Lipitor.      3.  Lisinopril.      4.  Omeprazole.      5.  She is not to restart Metformin until July 21, 2004.      6.  She may use sublingual nitroglycerin as needed for chest pain.   ACTIVITY:   No driving or strenuous activity or lifting over 10 pounds for  one week.   DIET:  Low-fat, low-salt, diabetic diet.   DISCHARGE INSTRUCTIONS:  Cleaning of her catheterization site with soap and  water, no tub bathing for one week, no scrubbing.   FOLLOW UP:  She is to call our office for any pain or swelling from the  catheterization site, an the [facility] office will call the patient for a follow-  up date and time.       LB/MEDQ  D:  07/20/2004  T:  07/20/2004  Job:  [id number]   cc:   [facility]  [place], [place]  [address]   Rory Percy  [address] [place]  [place] [address]  Fax: [phone]

## 2011-02-03 NOTE — Discharge Summary (Signed)
Donna, Howe NO.:  [id number]   MEDICAL RECORD NO.:  [id number]                   PATIENT TYPE:  OIB   LOCATION:  6523                                 FACILITY:  [facility]   PHYSICIAN:  Junious Silk, M.D. [facility]         DATE OF BIRTH:  1956-02-26   DATE OF ADMISSION:  04/06/2004  DATE OF DISCHARGE:  04/07/2004                                 DISCHARGE SUMMARY   PROCEDURES:  1. Cardiac catheterization.  2. Graft angiogram.  3. Percutaneous transluminal coronary angioplasty and Cypher stent to one     vessel.   HOSPITAL COURSE:  [name] is a 55 year old female with known coronary  artery disease.  She had a cath which showed the need for percutaneous  intervention and was scheduled for this on April 06, 2004.  She was admitted.   Her intervention was performed by putting a Cypher stent into the SVG to OM  at the distal anastomosis, reducing the stenosis from 95% to 0.  She had  TIMI III flow post-procedure and tolerated it well.   She was angio sealed and the dressing did have some bleeding, but this was  surface oozing and she did not fill the angio seal.  The next day, she had  no hematoma, ecchymoses, or bruit at the catheter site.  She had no chest  pain or shortness of breath, and her vital signs were within normal limits.  She is anemic, but she has been anemic previously.  We will get an iron  profile drawn prior to discharge and this will be followed up in the office.  [name] is considered stable for discharge on April 07, 2004, with  outpatient followup arranged.   LABORATORY DATA:  Hemoglobin 9.3, hematocrit 27.1.   CONDITION ON DISCHARGE:  Improved.   DISCHARGE DIAGNOSES:  1. Unstable anginal pain, status post percutaneous transluminal coronary     angioplasty and Cypher stent to the saphenous vein graft to obtuse     marginal.  2. Status post aortocoronary bypass surgery in February 2005.  3. Status post cardiac  catheterization in May 2005, with cutting balloon     percutaneous transluminal coronary angioplasty to the proximal obtuse     marginal and ostial left anterior descending, as well as percutaneous     intervention and a drug eluding stent to the left main and proximal     circumflex.  4. Diabetes.  5. Hyperlipidemia.  6. Remote history of tobacco use.  7. Family history of premature coronary artery disease.  8. Aortocoronary bypass grafting with left internal mammary artery to left     anterior descending, saphenous vein graft to circumflex and obtuse     marginal, and saphenous vein graft to right coronary artery.  9. Obesity.  10.      Hypertension.  11.      History of bronchitis.  12.      Allergy  to PENICILLIN.   ACTIVITY:  No driving for two days and no strenuous activity for a week.   DIET:  She is to stick to a low fat diabetic diet.   She is to call the office for problems with the cath site.   FOLLOWUP:  1. Dr. Domenic Polite on Thursday, April 21, 2004, at 10:30 a.m.  2. Dr. Nadara Mustard as needed.   DISCHARGE MEDICATIONS:  1. Lipitor 40 mg daily.  2. Glipizide XL 10 mg daily.  3. Metformin 1000 mg b.i.d.  4. Aspirin 325 mg daily.  5. Lisinopril 10 mg daily.  6. Plavix 75 mg daily.  7. Lopressor 12.5 mg b.i.d.  8. Imdur 30 mg daily.  9. Protonix 40 mg daily.  10.      Nitroglycerin p.r.n.  11.      Darvocet p.r.n.  12.      Acetaminophen with codeine p.r.n.      Rosaria Ferries, P.A. [facility]                  Junious Silk, M.D. [facility]    RB/MEDQ  D:  04/07/2004  T:  04/07/2004  Job:  [id number]   cc:   Rory Percy  [place], [place] [address]  Fax: [place], [place]

## 2011-02-03 NOTE — Cardiovascular Report (Signed)
NAMETOBY, ANDROS NO.:  [id number]   MEDICAL RECORD NO.:  [id number]                   PATIENT TYPE:  INP   LOCATION:  4730                                 FACILITY:  [facility]   PHYSICIAN:  Junious Silk, M.D. [facility]         DATE OF BIRTH:  04-13-1956   DATE OF PROCEDURE:  01/19/2004  DATE OF DISCHARGE:                              CARDIAC CATHETERIZATION   PROCEDURE PERFORMED:  Left heart catheterization with coronary angiography  and left ventriculography, bypass graft angiography and left  ventriculography.   INDICATION:  Ms. Michaelides is a 55 year old woman who presented two months ago  with unstable angina.  She had significant left main and ostial LAD and  ostial circumflex disease and a left dominant system. She also had ostial  right coronary artery disease.  She underwent four-vessel coronary artery  bypass surgery by Dr. Cyndia Bent with left internal mammary artery to the LAD,  sequential saphenous vein graft to obtuse marginal branch and left posterior  descending artery and a saphenous vein graft to the nondominant right  coronary artery.  She was readmitted to the hospital with symptoms of  unstable angina and was referred for cardiac catheterization.   PROCEDURAL NOTE:  A 6 French sheath was placed in the right femoral artery.  The native left coronary artery was imaged with a 6 [place] JL-4 catheter.  The native right coronary artery was imaged with a No-Torque right catheter.  The sequential saphenous vein graft to the circumflex system was imaged with  a JR-4 catheter.  The saphenous vein graft to the right coronary artery is  imaged with a right coronary bypass catheter.  The left internal mammary  artery was imaged with an internal mammary catheter.  Left ventriculography  was performed with an angled pigtail catheter.  Contrast was Omnipaque.  There were no complications.   CATHETERIZATION RESULTS:   HEMODYNAMICS:  1. Left  ventricular pressure 156/18.  2. Aortic pressure 156/82.  3. There is no aortic valve gradient.   LEFT VENTRICULOGRAM:  There is mild focal akinesis of the mid inferior wall.  Ejection fraction calculated at 71%.  There is trace mitral regurgitation.   CORONARY ARTERIOGRAPHY (LEFT DOMINANT):  Left main is very short and has an  80-90% stenosis throughout the length of the left main coronary artery.   Left anterior descending artery has an 80% stenosis at the ostium.  The LAD  gives rise to a normal size branching first diagonal, normal size second  diagonal and a small third diagonal.  The first diagonal has a 50% stenosis  in the superior branch.  The distal LAD fills via left internal mammary  graft.  Proximal to the mammary graft, the LAD is relatively small and there  appears to be a long 80% stenosis in the mid LAD.   Left circumflex is a dominant vessel.  There is disease in the ostium of the  circumflex approximately  50% in severity.  Circumflex gives rise to a large  branching first obtuse marginal. There is a 50% stenosis in the proximal  portion of the obtuse marginal.  The obtuse marginal fills via saphenous  vein graft.  The distal circumflex gives rise to a normal size branching  first posterior lateral branch and a small second posterior lateral branch  and a normal size posterior descending artery.   Right coronary artery is a nondominant vessel.  It has a 90% stenosis at its  ostium.  The distal right coronary artery fills via saphenous vein graft.   Left internal mammary artery to the distal LAD is patent.  However, the  distal LAD itself is relatively small and somewhat diffusely diseased with  50% stenosis in the apex and diffuse 80% stenosis in the vessel proximal to  the internal mammary insertion.   There was a sequential saphenous vein graft to the obtuse marginal branch in  the posterior descending artery. This graft is patent only to the obtuse  marginal  and then the distal limb of this graft is 100% occluded.  At the  anastomosis with the obtuse marginal, the native obtuse marginal is actually  tented at the site where the graft is sewn in and there is an 80% stenosis  going distally into the obtuse marginal and 75% stenosis proximally at the  obtuse marginal, both of these at the anastomosis with vein graft.   Saphenous vein graft to the distal right coronary artery is patent filling  nondominant right coronary artery.   IMPRESSION:  1. Left ventricular systolic function characterized by mild inferior     akinesis, but preserved ejection fraction.  2. Native three-vessel coronary artery disease with ostial left main, left     circumflex, and left anterior descending artery disease in a let dominant     system as well as ostial right coronary artery disease.  3. Status post coronary artery bypass surgery.  There is a patent left     internal mammary artery to the distal LAD with moderate diffuse disease     in the left anterior descending proximal and distal to the mammary     insertion. There is a patent graft in nondominant right coronary artery.     The graft to the circumflex system is patent to the first obtuse     marginal, but the distal limb of this graft is occluded.  There is     significant disease at the anastomosis with the obtuse marginal both     retrograde and anterograde in this vessel.   PLAN:  These findings were reviewed with Dr. Lia Foyer and Dr. Albertine Patricia.  The  percutaneous options are very limited and high risk.  We will therefore  review the case with Dr. Cyndia Bent to determine whether the patient may be a  candidate for redo coronary artery bypass surgery.                                               Junious Silk, M.D. [facility]    MWP/MEDQ  D:  01/19/2004  T:  01/20/2004  Job:  [id number]   cc:   Rory Percy  [address]  [place]  [place] [address]  Fax: [place] in [place], M.D.  [address]  [place] [address]  Fax: [phone]

## 2011-02-03 NOTE — Discharge Summary (Signed)
NAMEAMAJAH, DANDURAND                          ACCOUNT NO.:  [id number]   MEDICAL RECORD NO.:  [id number]                   PATIENT TYPE:  INP   LOCATION:  2035                                 FACILITY:  [facility]   PHYSICIAN:  Gilford Raid, M.D.                  DATE OF BIRTH:  04-Dec-1955   DATE OF ADMISSION:  11/13/2003  DATE OF DISCHARGE:                                 DISCHARGE SUMMARY   DATE OF SURGERY:  November 16, 2003.   ANTICIPATED DATE OF DISCHARGE:  November 20, 2003.   ADMITTING DIAGNOSIS:  Unstable angina.   PAST MEDICAL HISTORY:  1. Diabetes mellitus Type 2.  2. Hypertension.  3. Hyperlipidemia.  4. Hyponatremia.  5. Chronic cigarette use.   ALLERGIES:  This patient is allergic to PENICILLIN; it causes a rash.   DISCHARGE DIAGNOSES:  Severe 3-vessel coronary artery disease, status post  cardiac catheterization and coronary artery bypass grafting.   BRIEF HISTORY:  Ms. Eggers is a 55 year old Caucasian female.  She  presented to the Emergency Department at [facility] on February 23  with complaints of stabbing-type chest pain that radiated to the left arm  while she was doing a load of laundry.  The pain lasted several minutes.  It  was associated with shortness of breath and diaphoresis.  On admission her  blood sugar was 521.  Sodium was 129.  Her blood pressure was elevated at  185/97.  Initial EKG revealed normal sinus rhythm, no acute changes.  She  was admitted to rule out MI.   Her initial cardiac enzymes were negative.  [facility] was consulted  and she was evaluated by Dr. Ron Parker.  He recommended Cardiolite stress test.  This was performed on the 24th.  She had significant chest pain, some  shortness of breath and some mild ST depression with this examination.  The  nuclear image showed no evidence of significant scar or ischemia.  Dr.  Kae Heller recommendation was for cardiac catheterization.  She initially was  reluctant to proceed with this;  however, by the morning of February 25 she  decided that she would agree with this plan.   HOSPITAL COURSE:  On November 13, 2003 Ms. Shull was transferred from  [facility] to [facility] in the care of the [facility]  Cardiology service.  On arrival she denied any chest pain. Her vital signs  were stable.  Her cardiac enzymes continued to be negative.  She was placed  on the elective cardiac catheterization schedule for Monday, November 16 2003.  Catheterization revealed severe 3-vessel coronary disease including  high-grade, 80% diffuse, left main stenosis extending into the ostium of the  LAD and left circumflex.  Also 40% proximal stenosis of the large first  marginal artery.  The left circumflex was a dominant vessel.  The right  coronary artery was a small nondominant vessel.  It had  70% osteal stenosis.  Left ventricular ejection fraction was reported as normal.  There was no  mitral regurgitation and no gradient across the aortic valve.   Cardiac surgery consultation was requested and she was evaluated by Dr.  Gilford Raid.  After examination of the patient and review of the available  records Dr. Cyndia Bent agreed that coronary artery bypass grafting was the  preferred treatment choice for this woman.  The procedure, risks and  benefits were all discussed with Ms. Julio and her sister.  Ms. Sayre  agreed to proceed with surgery and she was scheduled for later in the day.   Prior to proceeding with surgery she was seen in consultation by smoking  cessation counsel.  The patient stated that she quit approximately 1 week  prior to admission and plans to stay quit after discharge.  She will be  followed in the post discharge period with phone calls.   She also underwent preoperative arterial evaluation that included a carotid  duplex scan.  There was no significant carotid artery disease noted.  Her  upper extremity exam included bilateral Allen's test that  were abnormal.  Lower extremity exam revealed ABIs greater than 1.0 bilaterally.   On February 28 Ms. Knobloch underwent the following surgical procedure with  Dr. Gilford Raid:  Coronary artery bypass grafting x4.  Grafts placed at the  time of the procedure:  Left internal mammary artery graft to the left  anterior descending artery; saphenous veins grafted in a sequential fashion  to the obtuse marginal and left circumflex artery; saphenous veins grafted  to the right coronary artery.  Vein was harvested from the right thigh via  the endo vein harvesting technique.  She tolerated this procedure well;  transferring in stable condition to the SICU.  Significant intraoperative  findings included diffuse disease.   Ms. Hutley remained hemodynamically stable in the immediate postoperative  period.  She was extubated several hours after arrival into the ICU.  She  required only routine care in the intensive care unit and was transferred  out to unit 2000 on postoperative day 1.  Ms. Glasner is making very good  progress in recovering from her surgery.  This morning, November 19, 2003 is  postoperative day 3.  She reports feeling very well.  Her vital signs are  stable with the blood pressure 110-128/60-88.  She is afebrile.  Her room  air saturation is 95%.  CBGs from March 2nd 203-269.  She was maintained on  a __________ protocol in the immediate perioperative period.  She has been  treated with Lantus and sliding scale insulin since surgery.  Glucophage,  oral medication, was restarted today and her Lantus dose was reduced.  She  is tolerating her diet.  She is ambulating independently.  Her pain is  adequately controlled.  Her incisions are all healing well.  She does have  minimal lower extremity edema.  If Ms. Medel continues to progress in this  manner it is anticipated that she will be ready for discharge home in the next 24-48 hours.  She will require follow up with her primary care  Makaylie Dedeaux  regarding her diabetes.   RECENT LABORATORY STUDIES:  March 2nd CBC white blood cells 12.7, hemoglobin  10.2, hematocrit 29.0, platelets 124.  March 3rd chemistries included sodium  134, potassium 4.0, BUN 11, creatinine 0.7, glucose 170.   CONDITION ON DISCHARGE:  Improved.   DISCHARGE MEDICATIONS:  1. Aspirin 325 mg p.o. daily.  2. Lopressor  25 mg b.i.d.  3. Lasix 40 mg daily for 5 days.  4. Potassium chloride 20 mEq daily for 5 days.  5. Lipitor 40 mg daily.  6. Glucophage 500 mg b.i.d. She has been reminded to check her blood sugar     each morning and record.  7. For pain management she can have Tylox 1-2 p.o. q.4h. for moderate-to-     severe pain or Tylenol 325 mg 1-2 p.o. q.4h. for mild pain.   INSTRUCTIONS ON DISCHARGE:  1. Activity:  She is to refrain from any driving or any heavy lifting,     pushing or pulling.  2. She is also instructed to continue her breathing exercises and daily     walking.  3. Diet:  Should continue to be a heart-healthy, diabetic diet.  4. Wound Care:  She is to shower with mild soap and water.  If her incisions     show any sign of infection or if she has a fever greater than 101 degrees     Fahrenheit she is to call Dr. Laurene Footman office.  5. Ms. Mcneff has been reminded to refrain from smoking cigarettes.   FOLLOW UP:  1. She will be seen in the [facility] Cardiology office in approximately 2     weeks.  An appointment will be arranged prior to her discharge.  2. Dr. Cyndia Bent would like to see her at the [facility] office on Tuesday, March 29     at 11 a.m. She will be asked to bring her chest x-ray from the [facility]     office.  3. She has been asked to arrange an appointment to see Dr. Nadara Mustard in the     next 1-2 weeks specifically regarding her diabetes.      Mardene Celeste, N.P.                  Gilford Raid, M.D.    CTK/MEDQ  D:  11/19/2003  T:  11/20/2003  Job:  [id number]   cc:   Gilford Raid, M.D.  [address]   [place]  [place] [address]   Dola Argyle, M.D.   [address] Per[address]  [place], [place] [address]  Fax: [phone]

## 2011-02-03 NOTE — Assessment & Plan Note (Signed)
[facility] OFFICE NOTE   TARRYN, BRACKEN                       MRN:          [id number]  DATE:12/17/2006                            DOB:          12/30/1955    Ms. Colton had a PCI in December 2007 to anastomotic lesion in a vein  graft using a cutting balloon angioplasty.  There was significant groin  bleed.  This ultimately stabilized.  There was no pseudoaneurysm.  Her  hematoma was improving, and her renal function was improving, and she is  to have followup labs soon with Dr. Nadara Mustard.  Since seeing her last, she  has not had any significant chest pain or shortness of breath.  She is  going about reasonable activities.   PAST MEDICAL HISTORY:   ALLERGIES:  PENICILLIN.   MEDICATIONS:  1. Furosemide 40.  2. Lisinopril 5.  3. Metoprolol 25.  4. Lipitor 40.  5. Imdur 120.  6. Clopidogrel 75.  7. Aspirin 325.  8. Glipizide 10 b.i.d.   OTHER MEDICAL PROBLEMS:  See the list below.   REVIEW OF SYSTEMS:  She has minor aches and pains, but otherwise she is  doing well, and her review of systems otherwise is negative.   PHYSICAL EXAMINATION:  VITAL SIGNS:  Today, blood pressure is 130/80  with a pulse of 85.  Weight is 256 pounds.  GENERAL:  The patient is oriented to person, time, and place, and affect  is normal.  HEENT:  No xanthomas.  She has normal extraocular motion.  She has no  carotid bruits.  There is no jugular venous distention.  CARDIAC:  S1 with an S2.  There are no clicks or significant murmurs.  LUNGS:  Clear.  There is no respiratory distress.  ABDOMEN:  Obese.  She has no peripheral edema.   No labs are done today.   Problems include:  1. PENICILLIN ALLERGY.  2. Diabetes.  3. Hyperlipidemia.  4. Hypertension.  5. Severe coronary disease post intervention in December 2007.  6. Chronic discomfort in her right leg, stable.  7. Good left ventricular function.  8. Asthma.  9.  Obesity.  10.Renal insufficiency, and we will wait or labs from Dr. Nadara Mustard.  11.History of CABG.   The patient is stable.  We will await labs from Dr. Nadara Mustard.  No change  in her medications today.  I can see her back in 6 months.     Carlena Bjornstad, MD, [facility]  Electronically Signed    JDK/MedQ  DD: 12/17/2006  DT: 12/17/2006  Job #: [id number]   cc:   [facility] Percy

## 2011-02-03 NOTE — Discharge Summary (Signed)
NAMEXIENNA, BILLY NO.:  [id number]   MEDICAL RECORD NO.:  [id number]          PATIENT TYPE:  INP   LOCATION:  6525                         FACILITY:  [facility]   PHYSICIAN:  Carlena Bjornstad, MD, FACCDATE OF BIRTH:  28-Mar-1956   DATE OF ADMISSION:  08/27/2006  DATE OF DISCHARGE:  08/31/2006                               DISCHARGE SUMMARY   PRIMARY CARDIOLOGIST:  Ernestine Mcmurray, MD,FACC.   PRIMARY CARE PHYSICIAN:  R[facility] Percy.   PRINCIPAL DIAGNOSIS:  CAD, status post PCI and stent of SVG to OM on  December 11.   SECONDARY DIAGNOSES:  1. Coronary artery disease.  Status post coronary artery bypass      grafting in 2005.  LIMA to LAD.  SVG to marginal branch of      circumflex.  SVG to native right coronary artery.  2. Mild renal insufficiency.  3. Hypoglycemia in presence of known diabetes.  4. Hypercholesterolemia.   PROCEDURES PERFORMED DURING HOSPITALIZATION:  1. Cardiac catheterization performed by Dr. Eustace Quail on August 28, 2006 revealing left main coronary artery.  Had stent extending      into the circumflex artery with 0% stenosis.  Circumflex artery:      Circumflex artery gave rise to marginal branch, two posterolateral      branches and a posterior descending branch.  The distal portion of      the stent in the proximal circumflex artery and distal vessels were      free of significant disease.  There was a 90% ostial stenosis in      the marginal branch with competing flow distally.  The left      anterior descending artery had 90% ostial stenosis.  This arose      from the stent and the left main and proximal circumflex artery and      there was competing flow distally.  The native right coronary      artery was not injected.  The saphenous vein graft to the right      coronary artery, which was a nondominant vessel was patent and      functioned normally.  The saphenous vein graft to the marginal      branch of the circumflex artery  was patent, but there was a 90%      stenosis just distal to the anastomosis right at the proximal edge      of the Cypher stent.  The LIMA graft to LAD was patent and      functioned normally.  2. Status post PCI and stenting of the anastomotic lesion of the      saphenous vein graft to the marginal branch of the circumflex      artery at the edge of the Cypher stent with improvement in      percentage in area of narrowing from 90% to 10% with cutting      balloon angioplasty.   HISTORY OF PRESENT ILLNESS:  This is a 55 year old female patient of  both Dr. Domenic Polite and Dr. Dannielle Burn who is usually seen  in the [facility]  who presented to the clinic after five or six episodes of substernal  chest pain.  Nitroglycerin did relieve; however, the pain was recurring  and she presented to the emergency room.  The patient was found to have  some renal insufficiency with a BUN of 44 and a creatinine of 2.1 with a  blood sugar of 333.  The patient was subsequently admitted for further  evaluation.   HOSPITAL COURSE:  On admission the patient's blood pressure was 130/80,  pulse 70, respirations 20.  The patient's hematocrit was 27.9.  Her  coags were normal.  The patient was scheduled for cardiac  catheterization the following day secondary to recurrent chest pain in  the setting of known CAD with bypass grafting.   Cardiac catheterization was performed on August 28, 2006 as described  above with subsequent successful percutaneous transluminal coronary  angioplasty of the anastomotic lesion at the saphenous vein graft as  described above.  The patient was followed status post cardiac  catheterization and had significant right groin tenderness after the  catheterization.  The patient was also noted to have a significant drop  in her hemoglobin post cardiac catheterization from 9.5 to 8.1.  The  patient's groin was examined and it did show a large, tender hematoma  with extensive mottling seen.   She denied any flank pain and her pulses  were strong distally.  After 2 units of packed red blood cells, the  patient's hemoglobin did not improve significantly with hemoglobin of  8.6.  The patient continued to have some right groin and thigh pain  along with hematoma.  The patient was seen by Dr. Domenic Polite on August 30, 2006 and a CT scan to assess for retroperitoneal bleed was  completed.  The patient was also transfused two additional units of  packed red blood cells.  Her ACE inhibitor was on hold secondary to  elevated BUN and creatinine with a BUN of 42 and a creatinine of 2.3.   CT scan of the abdomen revealed no evidence of retroperitoneal  hemorrhage.  No acute findings.  A large anterior abdominal wall hernia  containing small bowel.  The CT scan of the pelvis without contrast  revealed a large soft tissue hematoma in the right groin extending  inferiorly into the proximal thigh with no intrapelvic pathology  identified.  On day of dismissal, the patient's hemoglobin was 10.6.  After 4 units of packed red blood cells, BUN was 39 with a creatinine of  2.2.  The patient was seen and examined by Dr. Dola Argyle and found to  be stable for discharge.  The patient's right groin hematoma was soft.  There was no bruit auscultated, although she remained sore.   LABS ON DISCHARGE:  Hemoglobin 10.6, hematocrit 30.7, white blood cells  7.5, platelets 132, PT 13.6, INR 1, PTT 29, sodium 137, potassium 4.6,  chloride 102, CO2 27, glucose 109, BUN 39, creatinine 2.2, calcium 9.  Patient's troponin was negative at 0.05.   VITAL SIGNS ON DISCHARGE:  Blood pressure 115/45, pulse 74, respirations  18, O2 saturation 98% on room air.   FOLLOWUP APPOINTMENTS AND INSTRUCTIONS:  1. The patient is scheduled to see Dr. Johnny Bridge in the [place] office      on December 28 at 12:30 p.m.  Recommendation for followup CBC has      been made to that office. 2. The patient has been given discharge  instructions concerning her  hematoma to the right groin.  With any increase in pain, swelling,      weakness, dizziness or oozing from the site, the patient is to call      our office immediately.   DISCHARGE MEDICATIONS:  1. Aspirin 325 mg once a day.  2. Lisinopril 10 mg once a day.  3. Lipitor 40 mg at bedtime.  4. Imdur 120 mg once a day.  5. Lasix 40 mg once a day.  6. Lopressor 25 mg b.i.d.  7. Glipizide ER 10 mg twice a day.  8. Plavix 75 mg once a day.  9. Nitroglycerin 0.4 mg p.r.n. pain.   ALLERGIES:  PENICILLIN.   TIME SPENT WITH PATIENT:  Time spent with patient including physician  time, 30 minutes.      Phill Myron. Purcell Nails, NP      Carlena Bjornstad, MD, [facility]  Electronically Signed    KML/MEDQ  D:  08/31/2006  T:  09/01/2006  Job:  [id number]   cc:   Rory Percy

## 2011-02-03 NOTE — Discharge Summary (Signed)
NAMEBRYLEN, Donna Howe                          ACCOUNT NO.:  [id number]   MEDICAL RECORD NO.:  [id number]                   PATIENT TYPE:  INP   LOCATION:  2035                                 FACILITY:  [facility]   PHYSICIAN:  Gilford Raid, M.D.                  DATE OF BIRTH:  25-May-1956   DATE OF ADMISSION:  DATE OF DISCHARGE:                                 DISCHARGE SUMMARY   ADDENDUM:  On November 20, 2003, Mrs. Donna Howe's initial anticipated date of discharge, she  did report some intermittent nausea associated with her medications.  However, this did resolve after crackers.  In addition, her capillary blood  glucose readings were ranging above 200, despite being restarted on  Glucophage 500 mg twice a day.  She was also on Lantis 15 units at bedtime.   Other than these issues, she remained stable and in sinus rhythm.  She was  afebrile and was saturating 98% on room air. Her incisions also remained  without signs of infection.   Based on her elevated blood glucose readings and intermittent nausea, she  was kept for an additional day. At this time, we also obtained records that  she had been on Glucotrol XL 10 mg daily and Glucophage 1000 mg twice a day.  These were resumed.  If she continues to progress, Dr. Cyndia Bent does  anticipate that she will be discharged home on November 21, 2003.   DISCHARGE MEDICATIONS:  1. Aspirin 325 mg 1 p.o. daily.  2. Lopressor 25 mg p.o. b.i.d.  3. Lasix 40 mg  p.o. daily x5 days.  4. Potassium chloride 20 mEq 1 p.o. daily x5 days.  5. Lipitor 40 mg 1 p.o. daily.  6. Glucophage 1000 mg 1 p.o. b.i.d. with meals.  7. Glucophage XL 10 mg 1 p.o. daily.  8. Tylox, 1-2 p.o. q.4-h. p.r.n. moderate to severe pain or Tylenol 325 mg 1-     2 p.o. q.4-h. p.r.n. mild pain.   All additional discharge instructions and follow up appointments are  unchanged from her previously dictated discharge summary, job [id number].      Jacinta Shoe, P.A.                   Gilford Raid, M.D.    AWZ/MEDQ  D:  11/20/2003  T:  11/22/2003  Job:  [id number]   cc:   Dola Argyle, M.D.   [address]  [place], [place] [address]  Fax: [phone]   [facility] office

## 2011-02-03 NOTE — H&P (Signed)
Donna Howe, TREASE NO.:  [id number]   MEDICAL RECORD NO.:  [id number]          PATIENT TYPE:  INP   LOCATION:  [id number]                         FACILITY:  [facility]   PHYSICIAN:  Dola Argyle, M.D.     DATE OF BIRTH:  09-Dec-1955   DATE OF ADMISSION:  07/16/2004  DATE OF DISCHARGE:                                HISTORY & PHYSICAL   CHIEF COMPLAINT:  Chest pain.   HISTORY OF PRESENT ILLNESS:  Donna Howe is a 55 year old female, followed  in [place], with a history of bypass surgery in February 2005.  She came back  to the hospital for chest pain in May 2005, and had a percutaneous  intervention .  She was admitted again to the hospital, from June 20, 2004  to June 22, 2004, for chest pain.  She had a catheterization which showed  occlusion of a distal limb of the SVT to OM graft as well as a 95% stenosis  in the posterolateral branch.  An adenosine Cardiolite was also performed  that admission but no percutaneous intervention was attempted because the  Cardiolite was without ischemia.  Since discharge  home from the hospital,  the patient has only had two episodes of chest pain.  She was actually seen  in the cardiology office two days ago and was not having any significant  problems.  However, at approximately 2 a.m. today the patient had sudden  onset of left sided chest pain at rest.  The pain was described as a 9/10 in  pressure, and squeezing type pain that radiated down her left side to her  waist, as well as down her left arm, and into her left shoulder, and around  to her back between her shoulder blades.  She states she did not take any  sublingual nitroglycerin at home because she was afraid to and wanted to go  to the hospital.  She was afraid that the nitroglycerin would drop her blood  pressure.  She went to [facility] and received three sublingual  nitroglycerin there.  By the time she received the third nitroglycerin, her  pain resolved.  Total  duration of symptoms was about an hour and a half.  An  EKG, performed at [facility] while she was having chest pain, does not show  any acute ischemic changes.  The situation was discussed and it was felt  that she needed further evaluation by her cardiologist, and she was  transferred to [facility].  She is currently pain free.   PAST MEDICAL HISTORY:  1.  Status post aortocoronary bypass surgery, in February 2005, with LIMA to      LAD, SVG to circumflex and then to OM, an SVG to RCA.  2.  Status post catheterization, in October 2005, showing occlusion of the      distal limb of the SVG to circumflex graft and a 95% distal PDA.  3.  Status post PTCA and drug-eluting stents in May 2005, with cutting-      balloon angioplasty to the proximal obtuse marginal, ostial LAD, and  drug-eluting stents placed x 2 to the circumflex.  She also had staged      intervention with a drug-eluting stent to the SVG to OM.  4.  Diabetes.  5.  Hypertension.  6.  Hyperlipidemia.  7.  Obesity.  8.  Allergy to PENICILLIN.  9.  Status post periumbilical hernia repair.  10. Ongoing tobacco use.  70. Family history of coronary artery disease.  12. History of C-section.   ALLERGIES:  PENICILLIN.   CURRENT MEDICATIONS:  1.  Imdur 30 mg every day.  2.  Metoprolol 25 mg q.a.m. and 12.5 mg p.m.  3.  Metformin 1 gram b.i.d.  4.  Plavix 75 mg every day.  5.  Aspirin 81 mg every day.  6.  Glipizide 10 mg every day.  7.  Lipitor 40 mg every day.  8.  Lisinopril 10 mg every day.  9.  Nitroglycerin p.r.n.   FAMILY HISTORY:  Both of her parents are living in their 80s, and her mother  has a history of heart disease, but her father does not.  She has a brother,  however, who had bypass surgery in his mid 105s.   SOCIAL HISTORY:  She lives in [place] with her son.  She is not currently  employed.  She is divorced.  She has a greater than 50 pack year history of  tobacco but quit at the time of her  bypass surgery and has not restarted.   REVIEW OF SYSTEMS:  She has chronic left arm and shoulder pain that she has  had since the bypass surgery and has worsened recently and gets worse when  she uses that arm.  She states that she has not taken any pain pills that  help and the nitroglycerin does not make any difference.  She states that  this is different from the pain that she had in her left arm with the chest  pain she had last night.  The patient also has arthralgias in her right leg  that she stated started after a cath, and she has been told she may have  some nerve damage.  She denies any hematemesis, hemoptysis, or melena.  She  states that her reflux symptoms are well controlled on medication.  She  denies any recent fevers or chills.  She has some chronic dyspnea on  exertion but it has not changed lately.  She has been walking on a regular  basis between 30 minutes to an hour.  Review of systems is otherwise  negative.   PHYSICAL EXAMINATION:  VITAL SIGNS:  Temperature is 97.0, blood pressure  175/85, pulse 84, respiratory rate 20, O2 saturation 100% on room air.  GENERAL:  She is a well developed, obese, white female in no acute distress.  HEENT:  Her head is normocephalic and atraumatic.  Her pupils are equal,  round and reactive to light and accommodation.  Her extraocular movements  intact and nares are without discharge.  NECK:  There is no JVD, no thyromegaly, no bruits noted.  CV:  Her heart is regular in rate and rhythm with no significant murmur, rub  or gallop noted.  CHEST:  Clear to auscultation bilaterally.  ABDOMEN:  Soft and nontender with active bowel sounds.  No  hepatosplenomegaly noted by palpation.  EXTREMITIES:  There is no cyanosis, clubbing, or edema.  Distal pulses are  2+ in all four extremities.  Her previous cath site is well healed and no  femoral bruits are appreciated. SKIN:  No rashes or lesions are noted.  NEURO:  She is alert and oriented  with cranial nerves II-XII grossly intact.  MUSCULOSKELETAL:  No joint deformities or effusions are noted.   ASSESSMENT/PLAN:  Chest pain.  The patient has substrates for anginal chest  pain, although a recent Cardiolite was negative.  Dr. Ron Parker has evaluated the  patient and feels that we should rule her out for an myocardial infarction.  If enzymes are negative, a Cardiolite will be performed in the a.m.  If this  is positive, it help guide our management of this patient.  If the  Cardiolite is negative, then they are to review the films and see if  percutaneous intervention can be attempted.  The patient is otherwise stable  and will be continued on her home medications.  She has not had a  recent lipid profile or liver function tests and those will be checked.  A  TSH was checked in October and showed her diabetes to be in good control at  5.4.  She had iron deficiency anemia after her bypass surgery, and we will  recheck an iron profile as she has recently discontinued her iron tablets.  Dr. Dola Argyle determined her plan of care.       RB/MEDQ  D:  07/16/2004  T:  07/16/2004  Job:  [id number]   cc:   Rory Percy  [address] [place] [place]  [place] [address]  Fax: [phone]

## 2011-02-03 NOTE — Discharge Summary (Signed)
NAMEAUBREANA, [name] NO.:  [id number]   MEDICAL RECORD NO.:  [id number]                   PATIENT TYPE:  INP   LOCATION:  4711                                 FACILITY:  [facility]   PHYSICIAN:  Scarlett Presto, M.D.                DATE OF BIRTH:  10/19/1955   DATE OF ADMISSION:  01/17/2004  DATE OF DISCHARGE:  01/23/2004                                 DISCHARGE SUMMARY   PRIMARY CARDIOLOGIST:  Dr. Satira Sark.   PRIMARY CARE PHYSICIAN:  Dr. Rory Percy in [place], [place].   BRIEF HISTORY:  Donna Howe is a 55 year old female with known coronary  artery disease, status post coronary artery bypass graft in February 2005  with a LIMA to LAD, saphenous vein graft to left circumflex and sequential  graft to obtuse marginal 1, saphenous vein graft to the RCA.  She also has a  past medical history significant for diabetes, hyperlipidemia, hypertension  and history of tobacco abuse.  She presented initially with complaints of  chest discomfort and unrelieved x2 days.  Upon admission, her EKG revealed  no changes of ischemia or infarction.  She was admitted for further  evaluation with cardiac catheterization.   PAST MEDICAL HISTORY:  Past medical history as noted above.   ALLERGIES:  PENICILLIN.   HOSPITAL COURSE:  [name] was admitted on December 19, 2003 to [facility].  She had serial cardiac markers and EKGs.  She was started on  aspirin, beta blocker, ACE inhibitor, statin and IV heparin along with  nitroglycerin paste.  She ruled out for acute myocardial infarction with  serial cardiac enzymes.  She underwent cardiac catheterization on Jan 19, 2004, revealing preserved left ventricular systolic function, mild inferior  akinesis, native three-vessel coronary artery disease with ostial left main,  left circumflex and left anterior descending artery in a left dominant  system as well as an ostial RCA.  She is status post coronary  artery bypass  grafting and she had a patent LIMA to LAD with moderate diffuse disease in  the left anterior descending, proximal and distal to the mammary insertion.  She had a patent nondominant right coronary artery.  The graft of the  circumflex is patent to the first obtuse marginal but the distal limb of the  graft was occluded and there was significant disease in the anastomosis with  the obtuse marginal with both retrograde and antegrade in this vessel.  Post  catheterization, the patient developed right groin hematoma with a decrease  in her hemoglobin.  She had an ultrasound which ruled out pseudoaneurysm and  A-V fistula and noted a hematoma on Jan 20, 2004.  The patient was transfused  with red blood cells.  The patient had recurrent chest discomfort on Jan 20, 2004; she also was noted to have some ventricular tachycardia on telemetry  and was transferred to CCU  for closer monitoring.  On Jan 21, 2004, she  underwent repeat cardiac catheterization for performance of a percutaneous  transluminal coronary angioplasty of the distal anastomosis of the saphenous  vein graft to the first obtuse marginal, treating both the proximal and  distal portion of the obtuse marginal by the saphenous vein graft.  It was  recommended the patient be treated with Plavix for approximately 12 months  and if she continues to have recurrent or refractory chest discomfort,  consideration should be given to what may be a very high-risk percutaneous  coronary intervention of her left main/ostial/LAD/ostial left circumflex  coronary arteries.  The patient did well post PTCA, was able to be  transferred to telemetry unit on the 6th of May.  On Jan 23, 2004, the  patient was evaluated by Dr. Scarlett Presto and he felt the patient was  stable for discharge and she has had no recurrent chest discomfort and groin  with stable __________ with ultrasound revealing no pseudoaneurysm or A-V  fistula.  She is to follow  up with Dr. Satira Sark in 2-3 weeks.   LABORATORY DATA:  CBC today reveals a white blood cell count of 6.4,  hemoglobin of 9 and hematocrit of 27, platelets of 175,000; this is stable.  Sodium of 138, potassium of 3.7, chloride of 105, CO2 of 28, BUN 10,  creatinine 0.7 and glucose of 107 today.  Hemoglobin A1c was 5.9.  Lipid  profile was performed, revealing a total cholesterol of 140, triglycerides  of 190, HDL of 36 and LDL of 66.   Chest x-ray:  Mild cardiomegaly and no active disease on Jan 17, 2004.   EKG on Jan 22, 2004 reveals sinus rhythm at 63 beats per minute with minimal  Q waves in the inferior lead and nonspecific ST abnormalities, normal QRS  duration and normal P-R interval.   DISCHARGE DIAGNOSES:  1. Coronary artery disease, status post percutaneous transluminal coronary     angioplasty to the saphenous vein graft to the first obtuse marginal     branch, also status post coronary artery bypass grafting in February of     2005.  Patient to be treated with medical management of residual disease     and Plavix for approximately 12 months.  Reconsideration for percutaneous     intervention if she has any recurrent or refractory chest discomfort.  2. Right groin hematoma, stable.  3. Anemia requiring red blood cell transfusion during hospitalization.     Would recommend CBC at followup appointment in 1 week.  4. Diabetes mellitus.  5. Hyperlipidemia, lipid profile as above.  6. Hypertension.   DISCHARGE MEDICATIONS:  1. Lipitor 40 mg nightly.  2. Lexapro 10 mg daily.  3. Aspirin 325 mg daily.  4. Plavix 75 mg daily (new).  5. Lopressor 25 mg b.i.d.  6. Glucotrol 10 mg daily.  7. Glucophage 1000 mg b.i.d.  8. Nitroglycerin as needed for chest discomfort.  9. She has also been given a prescription for Darvocet-N 100 q.6 h. p.r.n.     pain, #30 with no refills, for pain not relieved by Tylenol.   DISCHARGE INSTRUCTIONS:  Discharge instructions include no heavy  exertion or lifting greater than 10 pounds or sexual activity for 2 weeks.   DIET:  She is to follow a diabetic, low-cholesterol diet.   WOUND CARE:  She will clean her cath site with soap and water and do not  soak in tub for the next 1 week.  She will call our office at [phone] with  problems with cath site including swelling, bleeding or pain not relieved  with the Darvocet.   FOLLOWUP:  She is to call the [facility] office at the above number on  Monday to schedule an appointment with Dr. Domenic Polite or Otelia Limes. Duran,  P.A. in the next 1-2 weeks.  She should have a complete blood count at that  time as well.      Amy Nelida Gores, P.A. [facility]                     Scarlett Presto, M.D.    AB/MEDQ  D:  01/23/2004  T:  01/25/2004  Job:  [id number]   cc:   Rory Percy  [address] [place] [address]  Fax: [phone]

## 2011-02-03 NOTE — Op Note (Signed)
Donna Howe, Donna Howe                          ACCOUNT NO.:  [id number]   MEDICAL RECORD NO.:  [id number]                   PATIENT TYPE:  INP   LOCATION:  2314                                 FACILITY:  [facility]   PHYSICIAN:  Gilford Raid, M.D.                  DATE OF BIRTH:  [birth year]   DATE OF PROCEDURE:  11/16/2003  DATE OF DISCHARGE:                                 OPERATIVE REPORT   PREOPERATIVE DIAGNOSIS:  High grade left main and severe three vessel  coronary artery disease.   POSTOPERATIVE DIAGNOSIS:  High grade left main and severe three vessel  coronary artery disease.   PROCEDURE:  Median sternotomy, extracorporal circulation, coronary artery  bypass graft surgery x4 using the left internal mammary artery graft to the  left anterior descending coronary, with a sequential saphenous vein graft to  the obtuse marginal and posterior descending branch of the left circumflex  coronary artery and a saphenous vein graft to the right coronary artery.  Endoscopic vein harvesting from the right leg.   SURGEON:  Gaye Pollack, M.D.   ASSISTANT:  Jobe Igo. Dub Mikes, P.A.-C.   ANESTHESIA:  General endotracheal.   INDICATIONS:  This patient is a 55 year old obese diabetic, hypertensive  white female with hypercholesterolemia and a strong family history of heart  disease who is admitted with unstable angina and had a positive stress test  with chest pain and negative nuclear studies.  Cardiac catheterization this  morning showed an 80% left main stenosis that extended into the ostium of  the left anterior descending and left circumflex.  There was a first  marginal that had about 40% proximal stenosis.  The left circumflex was a  dominant vessel supplying the posterior descending coronary artery.  The  right coronary artery was a smaller, nondominant vessel that about 70%  proximal and ostial stenosis.  The left ventricular ejection fraction was  normal with no mitral  regurgitation or aortic stenosis.  After review of the  angiogram and examination of the patient it was felt that coronary artery  bypass graft surgery was the best treatment.  I discussed the operative  procedure with the patient and her sister including alternatives, benefits  and risks including bleeding, blood transfusions, infection, stroke,  myocardial infarction and death.  They understood and agreed to proceed.   DESCRIPTION OF PROCEDURE:  The patient was taken to the operating room and  placed on the table in the supine position.  After induction of general  endotracheal anesthesia, a Foley catheter was placed in the bladder using a  sterile technique.  Then the chest, abdomen and both lower extremities were  prepped and draped in the usual sterile manner.  The chest was entered  through a median sternotomy incision and the pericardium opened in the  midline.  Examination of the heart showed good ventricular contractility.  The ascending aorta was relatively short.  It had no palpable plaques in it.   Then the left internal mammary artery was harvested from the chest wall as a  pedicle graft.  This was a medium caliber vessel with excellent blood flow  through it.  At the same time, a segment of greater saphenous vein was  harvested from the right leg using endoscopic vein harvest technique.  This  vein was of medium size and good quality.   Then the patient was heparinized, and when an adequate activated clotting  time was achieved, the distal ascending aorta was cannulated using a 20  French aortic cannula for arterial inflow.  Venous outflow was achieved  using the two stage venous cannula through the right atrial appendage.  Antegrade cardioplegia and vent cannula were inserted in the aortic root.   The patient was placed on coronary artery bypass and the distal coronary  arteries identified.  The left anterior descending was intramyocardial along  its proximal and mid  portions and exited distally to the surface of the  heart where it was graftable.  The obtuse marginal was visible proximally  where it was graftable.  It then became intramyocardial along the remainder  of its extent.  It was never visualized in the mid or distal portion.  The  distal left circumflex terminated as a medium size posterior descending  branch.  The right coronary artery was nondominant and was lying completely  in the atrioventricular groove deep within the fat.  I was able to locate  this in the midportion and it was a medium size graftable vessel.   Then the aorta was crossclamped and 500 mL of cold blood antegrade  cardioplegia was administered in the aortic root for quick arrest of the  heart.  Systemic hypothermia to 20 degrees Centigrade and topical  hypothermia with iced saline was used.  A temperature probe was placed in  the septum and insulating pad in the pericardium.   The first distal anastomosis was performed to the obtuse marginal branch.  The internal diameter was 1.75 mm.  The conduit used was a segment of  greater saphenous vein.  The anastomosis was performed in a sequential side-  to-side manner using continuous 7-0 Prolene suture.  Flow was measured  through the graft and was excellent.   The second distal anastomosis was performed in the posterior descending  branch of the left circumflex coronary artery.  The internal diameter was  about 1.5 mm.  The conduit used was the same segment of greater saphenous  vein.  The anastomosis was performed in a sequential end-to-side manner  using continuous 7-0 Prolene sutures.  The flow was measured through the  graft, and was excellent. Then another dose of cardioplegia was given down  the vein grafts and in the aortic root.   The third distal anastomosis was performed to the mid portion of the right coronary artery.  The internal diameter was about 1.7 mm.  The conduit used  was a  second segment of greater  saphenous vein.  The anastomosis was  performed in an end-to-side manner using continuous 7-0 Prolene suture.  Flow was measured through the graft and was excellent.   The fourth distal anastomosis was performed to the distal portion of the  left anterior descending coronary artery.  The internal diameter of this  vessel was about 1.6 mm.  The conduit used was the left internal mammary  graft and this was brought through an opening in the left pericardium  anterior  to the phrenic nerve and anastomosed to left anterior descending in  an end-to-side manner using continuous 8-0 Prolene suture.  The pedicle was  tacked to the epicardium with 6-0 Prolene sutures.  The patient was rewarmed  to 37 degrees Centigrade.  The two proximal vein graft anastomoses were  performed to the aortic root in an end-to-side manner using continuous 6-0  Prolene suture.  Then the clamps were removed from the mammary pedicle.  There was rapid warming of the ventricular septum and return of spontaneous  ventricular fibrillation.  The cross clamp was removed with a time of 83  minutes, and the patient converted to sinus rhythm.   The proximal and distal anastomoses appeared hemostatic.  The lie of the  graft is satisfactory.  The graft markers were placed around the proximal  anastomosis.  Two temporary right ventricular and right atrial pacing wires  were placed and brought out through the skin.   Then the patient had rewarmed to 37 degrees centigrade.  She was weaned from  cardiopulmonary bypass on inotropic agents.  Total bypass time was 105  minutes.  Cardiac function appeared excellent with a cardiac output of 5  liters per minute.  Protamine was given  and the venous and aortic cannula  were removed without difficulty.  Hemostasis was achieved.  Three chest  tubes were placed with two in the posterior pericardium, one in the left  pleural space and one in the anterior mediastinum.  The pericardium was   loosely reapproximated over the heart.  The sternum was closed with #6  stainless steel wires.  The fascia was closed with continuous #1 Vicryl  suture.  The subcutaneous tissue was closed with a continuous 2-0 Vicryl,  and the skin with 3-0 Vicryl subcuticular closure.  The lower extremity vein  harvest site was closed in layers in a similar manner.  The sponge, needle  and instrument counts were correct according to the scrub nurse.  Dry  sterile dressings were applied over the incisions, around the chest tubes  which were hooked to Pleuravac suction.  The patient remained  hemodynamically stable and was transported to the SICU in guarded but stable  condition.                                               Gilford Raid, M.D.    BB/MEDQ  D:  11/16/2003  T:  11/16/2003  Job:  [id number]   cc:   [facility] office   Marijo Conception. Wall, M.D.   Cardiac Catheterization Lab

## 2011-02-03 NOTE — H&P (Signed)
NAMESIERRA, HINZ NO.:  [id number]   MEDICAL RECORD NO.:  [id number]          PATIENT TYPE:  EMS   LOCATION:  MAJO                         FACILITY:  [facility]   PHYSICIAN:  Kirk Ruths, M.D. LHCDATE OF BIRTH:  July 31, 1956   DATE OF ADMISSION:  06/20/2004  DATE OF DISCHARGE:                                HISTORY & PHYSICAL   PHYSICIANS:  Primary care physician:  Dr. Nadara Mustard.  Primary cardiologist:  Dola Argyle, M.D. and Roque Cash, P.A.-C.   CHIEF COMPLAINT:  Chest pain.   HISTORY OF PRESENT ILLNESS:  Ms. Bernstein is a very pleasant 55 year old  female who presents to the emergency room today with chest discomfort.  She  has a history of coronary artery disease and is status post CABG in February  2005 with LIMA to LAD, vein graft to circumflex and obtuse marginal and vein  graft to RCA.  She is status post complicated percutaneous coronary  intervention in May 2005 with cutting balloon angioplasty to the proximal  obtuse marginal, ostial LAD, and drug-eluting stenting to the main and  proximal circumflex.  She came back for staged intervention of drug-eluting  stent to the vein graft to the obtuse marginal.  She says that she has had  chest discomfort ever since the time of her bypass surgery.  It has not  really improved all that much since her last intervention.  She notices  chest tightness, especially with exertion.  She will sometimes get it with  rest.  It goes to her left arm, and she has associated shortness of breath.  She denies any syncope, nausea, diaphoresis, orthopnea, paroxysmal nocturnal  dyspnea.  She does note tachy palpitations with her chest discomfort.  She  recently saw Dr. Ron Parker who adjusted her beta blocker therapy a couple of  weeks ago in [place].  She decided to come to the emergency room today because  she was having more pain at rest, and a family member prompted her to do  such.   PAST MEDICAL HISTORY:  1.  Coronary artery disease  status post CABG in February 2005 with grafts as      noted above and complex percutaneous coronary intervention as noted      above.  2  History of diabetes mellitus, type 2.  1.  Hypertension.  2.  Hypercholesterolemia.  3.  Obesity.  4.  Status post periumbilical hernia repair.   ALLERGIES:  PENICILLIN.   CURRENT MEDICATIONS:  1.  Metoprolol 50 mg in the morning, 1/2 tablet in the evening.  2.  Metformin 1 g b.i.d.  3.  Plavix 75 mg daily.  4.  Ecotrin 325 mg daily.  5.  Glipizide XL 10 mg daily.  6.  Lipitor 40 mg q.h.s.  7.  Lisinopril 10 mg daily.  8.  Nitroglycerin p.r.n.  9.  Omeprazole 20 mg daily.   SOCIAL HISTORY:  The patient lives in [place].  She quit smoking in February  2005.  She denies alcohol abuse.   FAMILY HISTORY:  Significant for coronary artery disease.   REVIEW OF SYSTEMS:  Please seen HPI.  She denies melena, hematochezia,  hematuria, dysuria, fevers, chills, or cough.  The rest of the Review of  Systems is negative.   PHYSICAL EXAMINATION:  GENERAL:  Well-nourished, well-developed, in no acute  distress.  VITAL SIGNS:  Blood pressure 145/82, pulse 83, respirations 22, oxygen  saturation 100% on room air.  Temperature 97.1.  HEENT:  Head normocephalic and atraumatic.  Eyes PERRLA.  EOMI.  Sclerae  clear.  NECK:  Supple without lymphadenopathy, thyromegaly, bruits, or JVD.  LYMPH:  No lymphadenopathy.  CARDIAC:  Normal S1 and S2.  Regular rate and rhythm without murmurs.  VASCULAR:  Without carotid bruits bilaterally.  Femoral pulses 2+  bilaterally without bruits.  LUNGS:  Clear to auscultation bilaterally without wheeze, rhonchi, or rales.  ABDOMEN: Soft, nontender with normoactive bowel sounds.  No hepatomegaly.  EXTREMITIES:  Trace edema bilaterally.  No clubbing or cyanosis.  MUSCULOSKELETAL:  Without joint deformity.  NEUROLOGIC:  Alert and oriented x 3.   LABORATORY AND X-RAY DATA:  Chest x-ray revealed mild cardiac enlargement.  Lungs  were clear.   EKG revealed normal sinus rhythm with heart rate of 69.  Normal axis, no  acute changes.   Preliminary laboratory data reveals hemoglobin 12.6, hematocrit 37.  Sodium  138, potassium 4.4, BUN 21, creatinine 0.9, glucose 103.   IMPRESSION:  1.  Chest discomfort.  2.  Coronary artery disease status post coronary artery bypass grafting.      1.  Status post multivessel percutaneous coronary intervention May 2005.      2.  Status post drug-eluting stenting to the vein graft to the obtuse          marginal in July 2005.  3.  Good left ventricular function.  4.  Diabetes mellitus, type 2.  5.  Hypertension.  6.  Hypercholesterolemia, treated.  7.  Obesity.   PLAN:  1.  The patient was also seen and examined by Dr. Stanford Breed.  2.  We plan to admit the patient and rule out for MI with serial cardiac      enzymes.  3.  We will place her on heparin and her regular home medications.  4.  We will set her up for cardiac catheterization tomorrow.  Risks and      benefits have been discussed with the      patient.  5.  As long as the patient has had no significant rash on Imdur in the past,      we will try to place her on Imdur to help prevent chest pain.       SW/MEDQ  D:  06/20/2004  T:  06/20/2004  Job:  [id number]   cc:   [facility], [place]   Dr. Nadara Mustard, [place], [place]

## 2011-02-03 NOTE — Cardiovascular Report (Signed)
NAME:  Donna Howe, Donna Howe                          ACCOUNT NO.:  [id number]   MEDICAL RECORD NO.:  [id number]                   PATIENT TYPE:  INP   LOCATION:  2903                                 FACILITY:  [facility]   PHYSICIAN:  Junious Silk, M.D. [facility]         DATE OF BIRTH:  [birth year]   DATE OF PROCEDURE:  01/21/2004  DATE OF DISCHARGE:                              CARDIAC CATHETERIZATION   PROCEDURE PERFORMED:  Percutaneous transluminal coronary angioplasty of the  distal anastomosis of the saphenous vein graft to the first obtuse marginal  branch treating both the proximal and distal portion of the obtuse margin  via the saphenous vein graft.  Kissing balloon technique was utilized.   PROCEDURAL NOTE:  An 8 French sheath was placed in the left femoral artery.  Angiomax was administered per protocol.  We used an 8 [place] LCD guiding  catheter with side holes.  Angiographic images revealed the presence of  complex stenosis at the distal anastomosis with the saphenous vein graft to  the first obtuse marginal branch.  This was an end-to-side anastomosis and  in the obtuse marginal going distal to the anastomosis there was an 80%  stenosis and going proximal to the anastomosis there was a 90% stenosis.  This graft supplied the large first obtuse marginal branch as well as the  entire distal circumflex which was a dominate vessel.  Of note, the patient  had been evaluated prior to this by Dr. Cyndia Bent and was felt not be a  candidate for redo coronary bypass surgery.   We advanced an Landscape architect under fluoroscopic guidance via the  saphenous vein graft into the obtuse marginal branch.  This wire was  actually passed into the proximal obtuse marginal and retrograde into the  circumflex.  We attempted to advance a second [place] soft wire via saphenous  vein grafting to the distal portion of the obtuse margin; however, we were  unable to cross the lesion with this wire.  We went in  with an [place] wire and we were able to pass this beyond the stenosis and advance  this wire into the distal portion of the first obtuse margin branch.   We then performed PTCA at the distal anastomosis with the balloon going  distal into the obtuse marginal branch using a 2.25 x 12 mm quantum balloon  inflated to 10 atmospheres.  We then advanced this 2.25 x 12 mm quantum  balloon over the [place]ahi soft wire going proximal into the obtuse marginal  branch and inflated this to 10 atmospheres.  Following this we advanced a  second 2.25 x 12 mm quantum balloon over the [facility] prowater wire and  positioned this going distally into the obtuse marginal branch.  We then  positioned our other quantum balloon going proximally into the obtuse margin  performing kissing balloon angioplasty.  Both balloons were inflated to 10  atmospheres.  We performed 2  such inflations, each for 90 seconds.  Intermittent doses of intercoronary nitroglycerin were administered.   Final angiographic images were obtained revealing patency of the distal  anastomosis with 20% residual stenosis at both PTCA sites and TIMI 3 flow  both antegrade and retrograde into the distal obtuse marginal and the  proximal obtuse marginal and distal circumflex. At the conclusion of the  procedure an AngioSeal basket or closure device was placed in the left  femoral artery with good hemostasis.   COMPLICATIONS:  None.   RESULTS:  Complex and successful kissing balloon angioplasty __________  distal anastomosis of the saphenous vein graft to the first obtuse marginal  branch, 90% stenosis in the more proximal portion of the obtuse marginal is  reduced to 20% residual, 80% stenosis in the more distal portion of the  obtuse margin was reduced to 20% residual with TIMI 3 flow into both  directions of the obtuse marginal.   PLAN:  Angiomax will be discontinued.  It is recommended that the patient be  treated with Plavix for  approximately 12 months because of her acute  coronary syndrome and aggressive coronary artery disease.  If she continues  to have recurrent and refractory chest pain, we may need to reconsider what  would be a very high-risk percutaneous coronary intervention of her left  main/osteal/LAD/osteal, left circumflex coronary arteries.                                               Junious Silk, M.D. [facility]    MWP/MEDQ  D:  01/21/2004  T:  01/21/2004  Job:  [id number]   cc:   Rory Percy  [place], [place] [address]  Fax: [place]  [place]   Cardiac Catheterization Lab

## 2011-02-03 NOTE — Cardiovascular Report (Signed)
NAMELOISANN, PELEGRIN              ACCOUNT NO.:  [id number]   MEDICAL RECORD NO.:  [id number]          PATIENT TYPE:  INP   LOCATION:  2101                         FACILITY:  [facility]   PHYSICIAN:  Ethelle Lyon, M.D. LHCDATE OF BIRTH:  June 23, 1956   DATE OF PROCEDURE:  07/18/2004  DATE OF DISCHARGE:                              CARDIAC CATHETERIZATION   PROCEDURES:  1.  Coronary bypass graft angiography.  2.  Intravascular ultrasound of the saphenous vein to the circumflex.   INDICATIONS:  Mr. Sampey is a 55 year old lady, status post coronary artery  bypass grafting in February of 2005, who has had substantial angina since.  She has subsequently undergo drug-eluding stent placement in the left main  extending through the proximal circumflex to supply distal circumflex  territory.  She is also status post drug-eluding stent placement in the  distal anastomosis of the saphenous vein graft to the first obtuse marginal.  She underwent cardiac catheterization just three weeks ago.  Medical therapy  was recommended.  She now returns with non-ST segment elevation myocardial  infarction.  She was therefore referred for diagnostic angiography with  __________ percutaneous revascularization.   PROCEDURAL TECHNIQUE:  Informed consent was obtained.  Under 1% lidocaine  local anesthesia, a 6 French sheath was placed in the right common femoral  artery using the modified Seldinger technique.  Diagnostic angiography and  ventriculography were performed using JL4 and JR4 catheters for the native  coronaries, JR4 for each of the vein grafts, a JR4 for the left subclavian  and a LIMA for the left internal mammary artery.  These demonstrated a  questionable lesion in the proximal portion of the stent in the vein graft  to the obtuse marginal at the distal anastomosis.  Everything else was  stable compared with three weeks ago.  After discussion with Dr. Vicenta Aly,  the decision was made to proceed  with intravascular ultrasound of this  lesion.   Anticoagulation was initiated with heparin to achieve an ACT of greater than  250 seconds.  A 6 [place] guide was advanced over wire and engaged in the  ostium of the vein graft to the obtuse marginal.  A [place] wire was advanced  through the stent into the distal OM without difficulty.  Intravascular  ultrasound was then performed using pacing made by automatic pullback.  This  demonstrated complete expansion of the stent with no end stent restenosis  and no evidence of thrombus within the stent.  The stent appeared fully  expended and well apposed throughout its course.  It ends just at the  anastomosis.  There is no stenosis in the vein graft proximal.  The decision  was made to manage her medically for the time being with assessment of  ischemia with an adenosine Cardiolite.   COMPLICATIONS:  None.   FINDINGS:  1.  Left main:  Widely patent stent extending into the proximal circumflex.  2.  Left anterior descending:  Ostial 95% stenosis.  There was a 70%      stenosis in the mid vessel.  The LIMA to the LAD is widely  patent with      excellent back filling of the vessel.  There was competitive flow in the      large first diagonal branch.  3.  Circumflex:  Large, dominant vessel.  There was a stent extending      through the proximal vessel which is widely patent.  The first obtuse      marginal has a 90% stenosis proximally.  The saphenous vein graft to      this vessel was widely patent with widely patent of the distal      anastomosis.  There is competitive flow in a modest sized branch just      proximal to the touchdown of the saphenous vein graft.  This is      unchanged from previous.  There are multiple branches supplying the      inferior wall from the circumflex.  These are widely patent.  4.  Right coronary artery:  Small, nondominant vessel.  The saphenous vein      graft to the vessel was widely patent.  5.  The left  subclavian is normal.   IMPRESSION/RECOMMENDATIONS:  Culprit lesion for acute coronary syndrome is  not clear.  The saphenous vein graft to the obtuse marginal looks excellent  by intravascular ultrasound.  The remaining possible culprit lesions are  that of the ostium, the first marginal and the LAD.  Both of these are  jailed by the [place].  The LAD territory should be well supplied via the LIMA  and PCI, although it would risk atrophy of the LIMA.  The OM1 lesion does  not supply much territory which is not supplied by the saphenous vein graft.  After discussion with Dr. Vicenta Aly, will check adenosine Cardiolite and  without maximize medical therapy.       WED/MEDQ  D:  07/18/2004  T:  07/18/2004  Job:  [id number]   cc:   Rory Percy  [place], [place] [address]  Fax: [phone]   Satira Sark, M.D. [facility]

## 2011-02-03 NOTE — Discharge Summary (Signed)
Donna Howe, GIANNATTASIO              ACCOUNT NO.:  [id number]   MEDICAL RECORD NO.:  [id number]          PATIENT TYPE:  INP   LOCATION:  6522                         FACILITY:  [facility]   PHYSICIAN:  Kirk Ruths, M.D. LHCDATE OF BIRTH:  1956/06/02   DATE OF ADMISSION:  06/20/2004  DATE OF DISCHARGE:  06/22/2004                           DISCHARGE SUMMARY - REFERRING   REASON FOR ADMISSION:  Ms. Fauntleroy is a 55 year old female followed by Dr.  Dola Argyle in [place] status post four vessel CABG in February of this year,  with subsequent complex percutaneous intervention in May of this year, who  presented to the emergency room  with symptoms worrisome for unstable angina  pectoris.  Please refer to detailed admission note for details.   LABORATORY DATA:  Normal serial cardiac markers.  Hemoglobin 12.2,  hematocrit 34, WBC 7.7, platelets 194 at discharge.  Sodium 140, potassium  3.9, glucose 156, BUN 15, creatinine 0.8 at discharge.  Liver enzymes  normal.  Hemoglobin A1C 5.6.  Admission chest x-ray shows no acute distress.   HOSPITAL COURSE:  The patient was admitted for management of unstable angina  pectoris, placed on intravenous  heparin, and continued on home medication  regimen with the addition of low dose Imdur.  The plan was to proceed with  relook cardiac catheterization.  Serial cardiac markers were all within  normal limits.  'The patient underwent diagnostic coronary angiography by  Dr. Allene Dillon (see report for full details), revealing coronary artery  disease progression with occlusion of the distal limb of the SVG - obtuse  marginal graft and continued patency of the LIMA/LAD and SVG/RCA graft,  revealing 100% occlusion of the distal limb of the SVG/PLA graft.  There was  mild LAD disease distal to the anastomosis.  There was patency of the SVG -  obtuse marginal graft with mild restenosis at the previous stent site and  patent stent to the obtuse marginal branch.  Of  note, there was known 100%  occlusion of the distal limb of the SVG - OM  graft as noted on previous  catheterization.  There was also continued patency of the left main/proximal  circumflex stent and the SVG/OM.  Dr. Vicenta Aly did note that the patient's  chest pain may be due to the 95% stenosis in the posterolateral branch.  The  recommendation was to proceed with follow up Adenosine Cardiolite.  This was  performed the following day and revealed no perfusion abnormalities,  calculated ejection fraction 66%.  The patient remained asymptomatic  following the catheterization with no complaint of chest pain.  The right  groin incision site was stable with no evidence of hematoma or possible  pseudoaneurysm.   Medication adjustments this admission:  Additional low dose Imdur.   DISCHARGE MEDICATIONS:  Imdur 30 mg daily, Metoprolol 15 mg q.a.m. and 25 mg  q.p.m., Metformin 1 gram twice daily, Plavix 75 mg daily, coated aspirin 325  mg daily, Glipizide 10 mg daily, Lipitor 40 mg daily, Lisinopril 10 mg  daily, Prilosec 20 mg daily, nitroglycerin 0.4 mg p.r.n.   DISCHARGE INSTRUCTIONS:  No heavy  lifting (greater than 10 pounds) for one  week, maintain low fat/cholesterol diet.  Call the office if there is any  swelling or bleeding from the groin.  The patient is instructed to keep her  previously scheduled follow up appointment with Roque Cash, P.A.-C., in [place]  on Thursday, October 27, at 11 a.m.   DISCHARGE DIAGNOSIS:  1.  Coronary artery disease progression.      1.  Normal cardiac markers.      2.  Status post cardiac catheterization October 4 revealing continued          wide patency of left internal mammary to left anterior descending          graft, saphenous vein graft to right coronary artery graft, with          known occlusion of the distal limb of the saphenous vein graft to          obtuse marginal posterolateral descending graft, continued patency          of the left  main/proximal stent and saphenous vein graft to obtuse          marginal stent sites; 95% posterolateral branch stenosis.      3.  Non-ischemic Adenosine Cardiolite (post catheterization).      4.  Preserved left ventricular  function.      5.  Status post complex multivessel percutaneous intervention in May          2005; stenting (drug eluding) saphenous vein graft to obtuse          marginal graft July 2005.      6.  Status post four vessel coronary artery bypass grafting in February          2005.  2.  Type 2 diabetes mellitus.  3.  Hypertension.  4.  Dyslipidemia.  5.  Obesity.       GS/MEDQ  D:  06/22/2004  T:  06/22/2004  Job:  [id number]   cc:   Rory Percy  [address] [place]. [place] [address]  Fax: [phone]

## 2011-02-03 NOTE — Consult Note (Signed)
Donna, Howe                          ACCOUNT NO.:  [id number]   MEDICAL RECORD NO.:  [id number]                   PATIENT TYPE:  INP   LOCATION:  2399                                 FACILITY:  [facility]   PHYSICIAN:  Gilford Raid, M.D.                  DATE OF BIRTH:  [birth year]   DATE OF CONSULTATION:  11/16/2003  DATE OF DISCHARGE:                                   CONSULTATION   REFERRING PHYSICIAN:  Christy Sartorius, M.D.   REASON FOR CONSULTATION:  High-grade left main and severe three-vessel  coronary artery disease with unstable angina.   HISTORY OF PRESENT ILLNESS:  This patient is a 55 year old obese, diabetic  white female with history of multiple cardiac risk factors including heavy  smoking, hypercholesterolemia, and a strong family history of heart disease  who presented with an episode of severe, stabbing, sharp chest pain in the  center of her chest lasting several minutes.  She was admitted to [facility] on November 11, 2003.  Her initial CPK and troponin levels  were negative.  Electrocardiogram showed no acute changes.  She subsequently  underwent a nuclear stress test.  She had chest pain and shortness of breath  and some ST depression.  The nuclear images showed no evidence of  significant scar or ischemia.  She was transferred to [facility] and  remained stable without chest pain.  She underwent cardiac catheterization  today which showed a high-grade 80% diffuse left main stenosis extending  into the ostium of the LAD and left circumflex.  There was about 40%  proximal stenosis of a large first marginal.  The left circumflex was a  dominant vessel.  The right coronary artery was a small nondominant vessel  that had 70% ostial stenosis.  Left ventricular ejection fraction was  normal.  There was no mitral regurgitation and no gradient across the aortic  valve.   PAST MEDICAL HISTORY:  Significant for type 2 diabetes for at least the past  2  years.  She has a history of hypertension.  She has a history of  hypercholesterolemia.  She is status post C-section about 20 years ago and  subsequently underwent hernia repair from this previous surgery and required  a revision with insertion of mesh.   ALLERGIES:  PENICILLIN which causes a rash.   REVIEW OF SYSTEMS:  GENERAL:  She denies fever or chills.  She has had no  recent weight changes.  She does have some fatigue.  EYES:  Negative.  ENT:  Negative.  ENDOCRINE:  She does have adult-onset diabetes.  She denies  hypothyroidism.  CARDIOVASCULAR:  As above.  She had new-onset sharp chest  pain.  She did have some associated shortness of breath and diaphoresis.  She denies PND and orthopnea.  RESPIRATORY:  She denies cough and sputum  production.  GI:  She denies nausea and vomiting.  She denies melena and  bright red blood per rectum.  GU:  She denies dysuria and hematuria.  PSYCHIATRIC:  Negative.  HEMATOLOGICAL:  She denies any history of bleeding  disorders or easy bleeding.  NEUROLOGIC:  She has no history of TIA or  stroke.  She denies dizziness and syncope.  She has had no focal weakness or  numbness.   SOCIAL HISTORY:  She works as a Radiation protection practitioner for [employer].  She is  divorced and has one son who is age 12, and she smokes one-half to one pack  of cigarettes per day.  She denies alcohol abuse.   FAMILY HISTORY:  Her father is 53 and has high blood pressure and COPD.  Her  mother is 40 and has hypertension, diabetes, and hyperlipidemia.  She has a  brother who is age 59 and underwent coronary bypass surgery about a year  ago.   PHYSICAL EXAMINATION:  VITAL SIGNS:  Her blood pressure is 150/85 and her  pulse is 70 and regular.  Regular rate and rhythm is 16 and unlabored.  GENERAL:  She is an obese white female in no distress.  HEENT:  Shows her to be normocephalic and atraumatic.  The pupils are equal  and reactive to light and accommodation.  Extraocular muscles  are intact.  Her throat is clear.  NECK:  Shows normal carotid pulses bilaterally.  There are no bruits.  There  is no adenopathy or thyromegaly.  CARDIAC:  Shows a regular rate and rhythm with normal S1 and S2.  There is a  grade 1/6 systolic murmur along the left sternal border.  LUNGS:  Clear.  ABDOMEN:  Shows active bowel sounds.  Her abdomen is soft and obese.  Abdomen is nontender.  There are no palpable masses or organomegaly.  There  is an old scar across the lower abdomen from a previous surgery.  EXTREMITIES:  Shows no peripheral edema.  Her pedal pulses are palpable  bilaterally.  SKIN:  Warm and dry.  NEUROLOGIC:  Shows her to be alert and oriented x3.  Motor and sensory exam  is grossly normal.   IMPRESSION:  High-grade left main and severe three-vessel coronary artery  disease with unstable angina.  I agree that coronary artery bypass graft  surgery is the best treatment to prevent further ischemia, infarction, and  sudden death.  I discussed the operative procedure with the patient and her  sister including alternatives, benefits, and risks including bleeding, blood  transfusion, infection, stroke, myocardial infarction, and death.  They  understand and agree to proceed.  I also discussed the importance of maximum  cardiac risk factor reduction including complete smoking cessation.  She  understands and agrees with this.                                               Gilford Raid, M.D.    BB/MEDQ  D:  11/16/2003  T:  11/16/2003  Job:  [id number]

## 2011-02-03 NOTE — Assessment & Plan Note (Signed)
[facility] OFFICE NOTE   ELZIE, WAHLEN                       MRN:          [id number]  DATE:09/14/2006                            DOB:          1955/10/23    Primary care physician is Dr. Rory Percy.   REASON FOR VISIT:  Post hospitalization followup.   HISTORY OF PRESENT ILLNESS:  Ms. Savitt is a 55 year old woman with  known coronary artery disease, last seen in the [facility] office by Dr. Ron Parker  in February of 2007.  She has a history of stenting of the left main  into the circumflex coronary artery, as well as prior stenting of the  obtuse marginal just past the distal anastomosis of a vein graft.  She  was recently admitted to [facility] with recurrent chest pain,  as well as baseline renal insufficiency, with a creatinine of 2.1.  She  ultimately underwent cardiac catheterization by Dr. Olevia Perches on the 11th  of December, which revealed no significant stenosis at the stent site  within the left main and proximal circumflex, 90% stenosis at the ostium  of the left anterior descending, with competing flow distally, 90%  stenosis in the ostium of the marginal branch at the circumflex coronary  artery, patent vein graft to a nondominant right coronary artery, patent  LIMA to the left anterior descending, and patent vein graft to the  obtuse marginal of the circumflex, although with 90% stenosis of the  distal anastomosis proximal edge of the Cypher stent that was previously  placed.  She underwent successful angioplasty of the anastomotic lesion  in the vein graft with improvement in the stenosis from 90% to 10% using  cutting balloon angioplasty.  She did have a reasonably large right  groin hematoma following the procedure, and required packed red cell  transfusions.  She had a CT scan of the abdomen and pelvis which did not  reveal any retroperitoneal hematoma.  She did have a large soft  tissue  hematoma in the right groin, extending inferiorly into the proximal  thigh.  On discharge, her hemoglobin was 10.6, and her BUN and  creatinine were 39 and 2.2.  Dr. Ron Parker saw her and arranged for her  discharge on the 14th.  She was referred to clinic today for followup.   Since her discharge she states that she was diagnosed with an upper  respiratory tract infection, and is completing a course of Ceftin.  She  has continued to have some discomfort in her right thigh, particularly  medially, but states that the ecchymosis has improved.  She has an  episode of shortness of breath a few days ago, but this was isolated,  and since then she has had no marked symptomatology.  She has not had  followup blood work as yet.   ALLERGIES:  PENICILLIN.   PRESENT MEDICATIONS:  As of discharge include:  1. Aspirin 325 mg p.o. daily.  2. Lisinopril 10 mg p.o. daily.  3. Lipitor 40 mg p.o. nightly.  4. Imdur 120 mg p.o. daily.  5. Lasix 40 mg p.o.  daily.  6. Lopressor 25 mg p.o. b.i.d.  7. Glipizide ER 10 mg p.o. b.i.d.  8. Plavix 75 mg p.o. daily.  9. Sublingual nitroglycerin 0.4 mg p.r.n.   REVIEW OF SYSTEMS:  As described in the history of present illness,  otherwise negative.   PHYSICAL EXAMINATION:  Blood pressure 130/68, heart rate is 80 and  regular, weight is 260 pounds.  This is a morbidly obese woman in no  acute distress, denying any active symptoms.  HEENT:  Conjunctivae are normal, oropharynx is clear.  NECK:  Supple without elevated jugular venous pressure, without bruits.  No thyromegaly is noted.  LUNGS:  Clear without labored breathing.  CARDIAC EXAM:  Reveals a regular rate and rhythm without loud murmur.  EXTREMITIES:  Examination of the right groin site reveals essentially  complete resolution of previously noted ecchymosis.  There is some  nodular fullness in the inguinal region.  No obvious pulsatility is  noted.  There is no loud bruit.    IMPRESSION/RECOMMENDATIONS:  1. Multi-vessel coronary artery disease status post previous coronary      artery bypass grafting with progressive disease as outlined above,      now status post cutting balloon angioplasty to an anastomotic      lesion around the site of a previous Cypher stent to the circumflex      marginal system.  We will plan to continue present medications, and      I will have a right groin ultrasound obtained given some residual      discomfort in this area, and general fullness.  This may simply be      resolving scar.  Given her renal insufficiency, I wil also order a      followup BMET to ensure that this is stable.  She continues on ACE      inhibitor therapy at this time, as well as Lasix.  Otherwise, we      will plan to have her come back to the clinic over the next few      weeks for subsequent symptom review.  2. Further plan is to follow up.     Satira Sark, MD  Electronically Signed    SGM/MedQ  DD: 09/14/2006  DT: 09/14/2006  Job #: [id number]   cc:   Rory Percy

## 2011-02-03 NOTE — Discharge Summary (Signed)
NAMEABEL, SAENGER NO.:  [id number]   MEDICAL RECORD NO.:  [id number]          PATIENT TYPE:  INP   LOCATION:  [id number]                         FACILITY:  [facility]   PHYSICIAN:  Ernestine Mcmurray, M.D. LHCDATE OF BIRTH:  27-Mar-1956   DATE OF ADMISSION:  10/04/2004  DATE OF DISCHARGE:  10/05/2004                                 DISCHARGE SUMMARY   PRIMARY CARE PHYSICIAN:  Rory Percy, M.D. in [place], [place].   CARDIOLOGIST:  Satira Sark, M.D. [facility].   DISCHARGE DIAGNOSES:  1.  Chest pain with negative cardiac enzymes, normal EKG, history of      coronary artery disease.  The patient is status post bypass surgery in      February of 2005 where she had a left internal mammary artery to her      left anterior descending, saphenous vein graft to the circumflex, jump      graft to an obtuse marginal, and saphenous vein graft to the right      coronary artery.  2.  She is status post cardiac catheterization most recently July 18, 2004.  At that time, her left main was widely patent with a stent that      extended to the proximal circumflex coronary artery.  Her left anterior      descending had an ostial 95% stenosis.  There was a 70% stenosis of the      mid vessel.  The LIMA to the LAD is widely patent with excellent back      filling.  The circumflex is a large dominant vessel.  The stent extends      into the proximal portion of the vessel.  The first obtuse marginal has      a 90% stenosis proximally.  The saphenous vein graft to this vessel was      widely patent.  There is competitive flow in a moderate sized branch      which is proximal to the touch down.  This is unchanged from her      previous catheterization.  She has multiple branches in the inferior      wall that are widely patent.  The right coronary artery is small and      nondominant.  A saphenous vein graft to the vessel was widely patent.      Her left subclavian is normal by  angiography.  She had an echocardiogram      at that time which showed preserved left ventricular systolic function.      An adenosine Cardiolite was also performed after this.  3.  History of diabetes without severe in organ damage other than the      coronary artery disease.  4.  Hypertension, which is difficult to control.  5.  Hyperlipidemia.  6.  Obesity.  7.  Previous history of tobacco abuse.   PAST SURGICAL HISTORY:  Positive for Cesarean section, umbilical hernia  repair.   PROCEDURES DONE THIS ADMISSION:  Adenosine Myoview, results as stated.  Motional stress images.  However, there appears to  be a reversible area of  adenosine-induced decreased myocardial perfusion involving the inferolateral  wall of the left ventricle.  Minimally decreased left ventricular ejection  fraction of 48%.  Normal LV wall motion.   HOSPITAL COURSE:  This is a very pleasant 55 year old Caucasian female with  history as stated above.  The patient presented to [facility] with  discomfort in her chest.  After she had taken some sublingual nitroglycerin,  had significant relief and then developed another episode of discomfort in  her chest.  She took 2 more sublingual nitroglycerin and this did not  resolve until she presented to [facility] in [place] where she received  oxygen and nitroglycerin drip.  She had resolution of the pain.  The patient  was transferred to [facility] for further workup.   LABORATORY DATA:  Initial lab work showed a BNP of 32, a glucose of 188, BUN  23, creatinine 1.0.  Chest x-ray reported normal performed at [facility].  EKG obtained at [facility] also showing sinus rhythm at a rate of  71 with nonspecific ST-T wave changes.  She has Q-waves in the inferior  leads consistent with an old inferior wall myocardial infarction.  On  arrival to [facility], the patient was pain-free.  She was admitted to cycle  her cardiac enzymes, to continue her heparin  and nitroglycerin drip.  On the  patient's adenosine Cardiolite on October 04, 2004 due to technical  difficulties were unable to only complete the stress images.  The patient  was back to the nuclear medicine on October 05, 2004 to complete resting  images.  In the meantime, Imdur was increased to 240 mg daily.  Heparin and  nitroglycerin intravenously were discontinued.  The patient was without any  further complaints or chest discomfort.   DISCHARGE VITAL SIGNS:  VITAL SIGNS: Temperature 96.3, pulse 70,  respirations 18, blood pressure 118/64, saturation 96% on room air.   DISCHARGE LAB WORK:  White blood cell count 8.3, hemoglobin 12.6, hematocrit  36, platelet count 226,000.  Cardiac enzymes as stated above, normal.  Troponin 0.01 x3.  TSH was 8.430.   DISPOSITION:  Results of adenosine Myoview discussed with Dr. Dannielle Burn.  At  this time, we do not feel it is necessary to repeat cardiac catheterization  and the patient is reluctant to have another cardiac catheterization done  yet.  After discussing with Dr. Dannielle Burn and with the patient and her husband,  at this time, we are going to discharge the patient home.  She has a follow-  up appointment with Dr. Domenic Polite on Friday in [place], and the patient is aware  that if she has any discomfort in the meantime, to use the nitroglycerin  sublingual prescription she has been given and to call [phone] as necessary.  If  possible, the patient may need further cardiac catheterization.  This can be  done outpatient through the [place] lab if the patient's pain continues to be  persistent.  The patient and her husband are both in agreement with this and  at this time, she would like to be discharged home and follow up with Dr.  Domenic Polite.  She has a prescription and has been instructed to increase her  Imdur to 240 mg daily also.      MB/MEDQ  D:  10/05/2004  T:  10/05/2004  Job:  [id number]   cc:   Rory Percy  [place], [place]  [address]  Fax:  [phone]   Satira Sark, M.D. [facility]

## 2011-02-03 NOTE — Discharge Summary (Signed)
Donna Howe, APPELL NO.:  [id number]   MEDICAL RECORD NO.:  [id number]                   PATIENT TYPE:  OIB   LOCATION:  6523                                 FACILITY:  [facility]   PHYSICIAN:  Junious Silk, M.D. [facility]         DATE OF BIRTH:  Mar 31, 1956   DATE OF ADMISSION:  03/24/2004  DATE OF DISCHARGE:  03/25/2004                                 DISCHARGE SUMMARY   PROCEDURES:  1. Cardiac catheterization.  2. Coronary arteriogram.  3. Left ventriculogram.  4. Graft angiogram.  5. Left internal mammary artery arteriogram.  6. Percutaneous intervention with a drug-eluding stent to the left main and     proximal circumflex.  7. Cutting balloon with percutaneous transluminal coronary angiography to     the ostial left anterior descending and obtuse marginal.   HISTORY OF PRESENT ILLNESS:  Donna Howe is a 55 year old female with a  history of coronary artery disease.  She is followed by Dr. Domenic Polite in  [place], [place], and by Dr. Domenic Polite in [place], [place], and by  Rory Percy, M.D.  She was seen in May of 2005 and had a cardiac  catheterization at that time.  It was felt that any intervention would be a  difficult procedure and medical therapy was the best option.  She was  discharged and followed up in the office.  She was seen in the office on  March 09, 2004, and was having recurrent anginal symptoms.  Her case was  reviewed and it was felt that percutaneous intervention was the best option  and she was admitted for this on March 24, 2004.   HOSPITAL COURSE:  The cardiac catheterization showed severe native three-  vessel disease and patent bypass grafts with the SVG to OM having a 95%  stenosis at the anastomosis.  Dr. Vicenta Aly felt that the best option at  this time was to treat the proximal OM lesion 75% with a cutting balloon  PTCA, reducing that stenosis to 30%.  He also used cutting balloon and PTCA  to the ostial LAD,  reducing that stenosis from 90% to 40%.  She had  percutaneous intervention and a drug-eluding stent to the left main and  proximal circumflex, reducing that stenosis from 95% to 0.  She is to come  back to the hospital for staged intervention of the SVG to OM in  approximately two weeks.   Donna Howe tolerated the procedure well and the sheaths were removed  without difficulty.  The next day, her anemia had worsened only slightly  with a hemoglobin of 9.7 and a hematocrit of 27.3.  Her other laboratory  values were within normal limits, except for an elevated blood sugar at 207.  Her Glucophage had been held for the case.  Her post procedure CK-MB was  negative.  Donna Howe is to ambulate and if she does well with this is  considered stable  for discharge on March 25, 2004, with outpatient followup  arranged.   She has been anemic since May of 2005.  Her blood counts have not  significantly changed, so we will check an iron profile prior to discharge  and the results will be available by the time she comes back to the office  on April 04, 2004.   DISCHARGE CONDITION:  Improved.   DISCHARGE DIAGNOSES:  1. Unstable anginal pain, status post percutaneous intervention with a drug-     eluding stent to the left main and proximal circumflex, cutting balloon     PTCA to the ostial LAD and proximal OM.  2. Status post aortocoronary bypass surgery in February of 2005 with LIMA to     LAD, SVG to OM, and SVG to RCA.  3. Status post cardiac catheterization in February of 2005 with angioplasty     and encasing balloon technique performed at the distal anastomosis of the     SVG to the OM1.  4. Preserved left ventricular function with an ejection fraction of 60% at     catheterization this admission.  5. Diabetes.  6. Allergy to penicillin.  7. Family history of premature coronary artery disease.  8. Approximately a 30-pack-year history of tobacco, quit in February of     2005.  9.  Hyperlipidemia.  10.      Obesity.  11.      Status post cesarean section.   DISCHARGE INSTRUCTIONS:  1. Her activity level is to include no strenuous activity.  2. She is to stick to a low-fat, diabetic diet.  3. She is not to exercise.  4. She is to follow up at the [place], [place], on April 04, 2004, at 1     p.m. with a CBC.  She is to go to the hospital at [facility] on April 06, 2004,     at 7:30 a.m. for a 9:30 a.m. procedure.  She will be NPO after midnight.   DISCHARGE MEDICATIONS:  1. Plavix 75 mg p.o. daily.  2. Nitroglycerin sublingual p.r.n.  3. Lipitor 40 mg q.h.s.  4. Glipizide XL 10 mg q.a.m.  5. Glucophage 1000 mg b.i.d.  Restart Sunday.  6. Aspirin 325 mg daily.  7. Lisinopril 10 mg daily.  8. Lopressor 12.5 mg b.i.d.  9. Imdur 30 mg daily.  10.      Protonix 40 mg daily.      Rosaria Ferries, P.A. [facility]                  Junious Silk, M.D. [facility]    RB/MEDQ  D:  03/25/2004  T:  03/26/2004  Job:  [id number]   cc:   [facility], [address]  [place], [place] [address]  Fax: [phone]

## 2011-02-03 NOTE — H&P (Signed)
NAMEDANYELE, CULLIPHER              ACCOUNT NO.:  [id number]   MEDICAL RECORD NO.:  [id number]          PATIENT TYPE:  INP   LOCATION:  1823                         FACILITY:  [facility]   PHYSICIAN:  Peter C. Johnsie Cancel, MD, FACCDATE OF BIRTH:  06-17-1956   DATE OF ADMISSION:  08/27/2006  DATE OF DISCHARGE:                              HISTORY & PHYSICAL   Admission for chest pain.   Ms. Hugley is a 55 year old patient of Dr. Domenic Polite and Dr. Dannielle Burn.  She is usually seen up in the [facility].   She has a history of coronary artery disease with bypass surgery in  2005.  She subsequently has had stenting of the native circumflex  coronary artery.   Her last cath in January 2006 showed patent stents without obvious  lesions.  An adenosine Myoview showed an inferior lateral defect.   The patient did not want to have a repeat heart cath.   She was treated medically.  Today she has had 5 or 6 episodes of  substernal chest pain.  She took 3 nitroglycerine with relief.  However,  the pain kept coming back for short periods of time, and she came to the  emergency room.  Of note, the patient has been having problems with both  her sugar and her kidneys.  Apparently her Actos and metformin were  stopped recently due to elevated creatinine.   She did have some mild renal insufficiency and hypoglycemia on admission  to the ER.   REVIEW OF SYSTEMS:  Remarkable for no significant palpitations,  presyncope, PND, orthopnea, or lower extremity edema.   Her pain does not really have any GI overtones.   PAST MEDICAL HISTORY:  Remarkable for previous tobacco abuse, obesity,  hyperlipidemia, hypertension, bypass surgery in 2005, and post CABG  stenting of the native circ.   FAMILY HISTORY:  Remarkable for diabetes and coronary disease on her  mother's side. The patient lives in [place] with her husband who is with  her.  She does not work.  She is fairly sedentary.  She does not smoke  or drink.   MEDICATIONS:  1. An aspirin a day.  2. Lisinopril 10 mg a day.  3. Lipitor 40 a day.  4. Imdur 120 a day.  5. Lasix 40 a day.  6. Lopressor 25 b.i.d.  7. Glipizide ER 10 b.i.d.  8. Plavix 75 a day.  9. NitroQuick p.r.n.   ALLERGIES:  SHE IS ALLERGIC TO PENICILLIN.   PHYSICAL EXAMINATION:  She is obese.  Blood pressure 130/80, pulse 70 and regular.  HEENT:  Normal.  There is no thyromegaly.  No lymphadenopathy.  LUNGS:  Clear.  There is an S1, S2.  Distant heart sounds.  ABDOMEN:  Benign.  There is no hepatosplenomegaly.  There is no  abdominal aortic aneurysm.  Distal pulses are intact with no edema.   The patient's EKG shows normal sinus rhythm with normal intervals.   There are no acute changes.   Chest x-ray shows previous sternotomy with no acute changes.   The patient's lab work is remarkable for BUN 44, creatinine 2.1,  blood  sugar 333.  Point of care enzymes are negative.  Hematocrit was 27.9 and  subsequently 30.  COAGs were normal.   Potassium is 4.3.   IMPRESSION:  Mrs. Astorino is having recurrent chest pain.  She will need  repeat heart catheterization.   We will continue her aspirin and Plavix.  We will start heparin per  pharmacy protocol.   Her blood counts are somewhat low.  She has not noted any change in her  stools.  We will guaiac them.   I think the patient can tolerate heparin along with aspirin and Plavix.   We will continue her beta-blocker and oral nitrates.   In regards to her sugar, we may need an internal medicine consult.  For  the time-being, I will maintain her on glipizide 10 b.i.d., and cover  her with sliding scale insulin including nighttime Lantus.   In regard to the patient's renal failure, we will hold her ACE  inhibitor, and diuretic.  She will be hydrated with normal saline.  I  suspect that her cath should be delayed at least 24 hours to follow her  hematocrit, control her blood sugar, and hydrate the patient to optimize   her BUN and creatinine.   Prior to her heat cath, we can do a 2D echocardiogram to assess her left  ventricular function, and she will probably be a candidate for bicarb  infusion.  We will tentative schedule her heart cath for Tuesday,  pending results of her ongoing check of her BUN and creatinine, as well  as clinical condition.     Wallis Bamberg. Johnsie Cancel, MD, [facility]  Electronically Signed    PCN/MEDQ  D:  08/27/2006  T:  08/27/2006  Job:  [id number]

## 2011-02-03 NOTE — Assessment & Plan Note (Signed)
Donna Howe OFFICE NOTE   Donna, Howe                       MRN:          [id number]  DATE:10/26/2006                            DOB:          08-01-1956    Donna Howe has known significant coronary disease. Most recently she  did have PCI to an anastomotic lesion in a vein graft with improvement  using a cutting balloon angioplasty. She had a significant groin bleed  and then she stabilized. She was discharged home. The patient was seen  in the office on December 28, by Dr. Johnny Bridge and there is a very  nice and complete note. Since that time, she has had some mild shortness  of breath and very slight chest pain. However, she appears to be stable.   PAST MEDICAL HISTORY:   ALLERGIES:  PENICILLIN.   MEDICATIONS:  1. Furosemide 40.  2. Lisinopril 5.  3. Metoprolol 25.  4. Lipitor.  5. Imdur.  6. Clopidogrel.  7. Aspirin.  8. Glipizide.   OTHER MEDICAL PROBLEMS:  See the extensive list on my note of November 07, 2005.   REVIEW OF SYSTEMS:  As mentioned above, she had some shortness of breath  and slight chest pain and otherwise she is stable.   PHYSICAL EXAMINATION:  Blood pressure 130/82 with a pulse of 75. Her  weight is 257 pounds, which is decreased by 3 pounds since her last  visit. The patient is oriented to person, time and place and her affect  is normal. Of course, she is disappointed with the amount of disease  that she has that she has some recurring symptoms. The patient is  markedly overweight.  She has no xanthelasma. There is normal extraocular motion.  LUNGS:  Are clear.  CARDIAC: Reveals an S1, with an S2, but no clicks or significant  murmurs.  ABDOMEN: Obese, but soft.  She has trace peripheral edema in her right leg.   The patient has had Doppler followups of her right groin. There has been  no evidence of pseudo aneurysm or AV fistula in the right groin and  her  hematoma was getting smaller.   The patient's creatinine had been elevated when she was in the hospital  and followup was done on September 17, 2006 and her creatinine remained  2.2 with a BUN of 52. My understanding is that the patient also may have  had some labs through Dr. Nadara Mustard and we will get those results soon.   PROBLEMS:  1. PENICILLIN ALLERGY.  2. Diabetes.  3. Hyperlipidemia.  4. Hypertension.  5. Severe coronary disease post recent intervention.  6. Chronic discomfort in her right leg.  7. Good left ventricular function.  8. Discomfort in her arms and shoulders.  9. Asthma.  10.Obesity.  11.Renal insufficiency.   We will obtain results and I will be seeing her back soon. At this  point, I do not have any further information concerning further renal  workup, but this will have to be kept in mind. Will see her for early  followup.  Donna Bjornstad, MD, [facility]  Electronically Signed    JDK/MedQ  DD: 10/26/2006  DT: 10/26/2006  Job #: [id number]   cc:   Rory Percy

## 2011-02-03 NOTE — Cardiovascular Report (Signed)
Donna Howe, Donna Howe NO.:  [id number]   MEDICAL RECORD NO.:  [id number]                   PATIENT TYPE:  OIB   LOCATION:  NA                                   FACILITY:  [facility]   PHYSICIAN:  Junious Silk, M.D. [facility]         DATE OF BIRTH:  1955/12/11   DATE OF PROCEDURE:  04/06/2004  DATE OF DISCHARGE:                              CARDIAC CATHETERIZATION   PROCEDURE PERFORMED:  1. Selective coronary angiography of the left coronary arteries.  2. Percutaneous coronary intervention with placement of a drug-eluting stent     in the distal anastomosis of the saphenous vein graft to first obtuse     marginal.   INDICATION:  [name] is a 55 year old woman with history of diabetes.  She is status post coronary artery bypass surgery.  Two months after surgery  she was found to have an occlusion of the distal limb of a sequential vein  graft to the obtuse marginal and posterior lateral branch.  In addition,  there was significant disease of the distal anastomosis of this vein graft  with the first obtuse marginal branch.  She also had critical left main and  proximal left circumflex stenosis.  We initially did percutaneous  transluminal coronary angioplasty to the distal anastomosis of the saphenous  vein graft to the first obtuse marginal branch.  She returned with recurrent  chest pain.  Cardiac catheterization showed restenosis at that percutaneous  transluminal coronary angioplasty site.  Two weeks ago we therefore  performed placement of a drug-eluting stent in the native left main coronary  artery extending into the left circumflex.  The patient returned today for  planned staged intervention of the distal anastomosis of the saphenous vein  graft to the first obtuse marginal branch.   CATHETERIZATION PROCEDURAL NOTE:  A 6 French sheath was placed in the right  femoral artery.  Angiomax was administered per protocol.  Selective coronary  angiography of the left coronary artery was performed with a 6 [place] JL-4  diagnostic catheter.  This revealed a widely patent in the left main  extending into the proximal left circumflex.  There was disease in the  ostium of the left anterior descending artery and the ostium of the first  obtuse marginal branch which was relatively stable being approximately 50%  at both sides.  We therefore proceeded with percutaneous coronary  intervention.  We utilized a 6 [place] LCB guiding catheter.  An Asahi soft  coronary guide wire was advanced under fluoroscopic guidance through the  saphenous vein graft into the distal portion of the first obtuse marginal.  We then performed percutaneous transluminal coronary angioplasty with a 2.25  x 12-mm Quantum balloon inflated to 12 atmospheres.  We then positioned a  2.5 x 13-mm Cypher drug-eluting stent across the area of stenosis at the  distal anastomosis.  We carefully positioned the stent such that the  proximal margin of the  obtuse marginal at the distal anastomosis.  We did  this because of significant size discrepancy between the size of the vein  graft and the obtuse marginal.  We then deployed the stent at deployment  pressure of 7 atmospheres.  We then went back with our 2.25 x 12-mm Quantum  balloon and inflated this to 14 atmospheres within the margins of the stent  and then 20 atmospheres at the proximal portion of the stent.  Intermittent  doses of nitroglycerin were administered.  Final angiographic images were  obtained revealing patency of the obtuse marginal with 0% residual stenosis  at the stent site and TIMI-3 flow into the distal vessel.   At the conclusion of the procedure, an Angio-Seal vascular closure device  was placed in the right femoral artery with good hemostasis.   COMPLICATIONS:  None.   RESULTS:  Successful percutaneous coronary intervention with placement of  drug-eluting stent in the distal anastomosis of the  saphenous vein graft to  first obtuse marginal.  A 95% stenosis was reduced to 0% residual with TIMI-  3 flow.   PLAN:  Angiomax will be discontinued.  The patient will be continued on  Plavix for recommended 12 months.                                               Junious Silk, M.D. [facility]    MWP/MEDQ  D:  04/06/2004  T:  04/06/2004  Job:  [id number]   cc:   Rory Percy  [place], [place] [address]  Fax: [phone]   Satira Sark, M.D. [facility]

## 2011-02-03 NOTE — Cardiovascular Report (Signed)
[name], Howe                          ACCOUNT NO.:  [id number]   MEDICAL RECORD NO.:  [id number]                   PATIENT TYPE:  INP   LOCATION:  2040                                 FACILITY:  [facility]   PHYSICIAN:  Donna Howe, M.D.                   DATE OF BIRTH:  July 15, 1956   DATE OF PROCEDURE:  11/16/2003  DATE OF DISCHARGE:                              CARDIAC CATHETERIZATION   PROCEDURES PERFORMED:  1. Left heart catheterization.  2. Left ventriculogram.  3. Selective coronary angiography.  4. Abdominal aortogram.   DIAGNOSES:  1. Three-vessel coronary artery disease.  2. Normal left ventricular systolic function.  3. Unstable angina.   HISTORY:  Donna Howe is a 55 year old white female with history of diabetes old white female with history of diabetes  mellitus, tobacco use, obesity who presents with substernal chest  discomfort.  The patient was admitted to the hospital with unstable angina  and stabilized medically.  She underwent stress imaging study in which she  had significant chest pain and ECG changes although Cardiolite imaging did  not suggest ischemia or scar.  Due to persistence of symptoms, she is  referred for left heart catheterization.   TECHNIQUE:  Informed consent was obtained.  The patient brought to the  catheterization lab.  A 6 French sheath was placed in the right femoral  artery using the modified Seldinger technique.  A 6 [place] JL-4 and JR-4  catheter was then used to engage the left and right coronary arteries and  selective angiography performed in various projections using manual  injection contrast.  Additional angiography of the right coronary artery was  performed using a special right catheter.  Subsequently, a 6 French pigtail  catheter was advanced in the left ventricle and a left ventriculogram  performed using power injection contrast.  Pigtail catheter was brought back  in the abdominal aorta and abdominal aortogram performed using power  injection contrast.   After termination of the case, the catheters and  sheaths were removed.  Manual pressure applied until adequate hemostasis was  achieved.  The patient tolerated the procedure well and was transferred to  the floor in stable condition.   FINDINGS:   LEFT HEART CATHETERIZATION:  1. Left main trunk:  The left main trunk is severely and diffusely diseased     with narrowing of up to 80%.  2. LAD:  This is a large caliber vessel that provides a bifurcating first     diagonal branch in the mid section and small second diagonal branch     distally.  The LAD has ostial narrowing of  70%. There is then diffuse     disease of 30-40% in the mid and distal segments.  3. Left circumflex artery is dominant.  It is a large caliber vessel that     provides large first marginal branch in the proximal segment, bifurcating     second marginal branch distally and  then the posterior descending the     ostium of the left circumflex artery has severe narrowing of 70%.  The     first marginal branch has proximal narrowing of 40%.  The AV circumflex     has 30% narrowings distally.  The distal branch vessels have mild disease     of 30-40%.  4. Right coronary artery is nondominant.  This is a small caliber vessel     that provides an RV marginal branch in the mid section, large branch     distally that extends to the distal inferior wall.  There is an ostial     narrowing of 70%.   LEFT VENTRICULOGRAPHY:  1. Normal end-systolic and end-diastolic dimensions.  2. Overall left ventricular function is well preserved.  3. Ejection fraction greater than 55%.  4. No mitral regurgitation.  5. LV pressure is 130/5.  6. Aortic pressure is 130/70.  7. LVEDP equals 15.   Abdominal aorta is of normal caliber.  There is mild atheromatous disease.  The right renal artery is single and widely patent with mild narrowing of  30% in the proximal segment.  There are two left renal arteries that are  patent with mild  disease.  The iliac arteries are widely patent with mild  irregularities.   ASSESSMENT AND PLAN:  Donna Howe is a 55 year old female with multiple cardiovascular risk factors female with multiple  cardiovascular risk factors who presents with unstable angina and advanced  three-vessel coronary artery disease involving the left main trunk.  The  patient's stress imaging study probably represents balance ischemia. Due to  the advanced nature of her coronary disease, she will be referred for  surgical revascularization.                                               Donna Howe, M.D.    Donna Howe  D:  11/16/2003  T:  11/16/2003  Job:  [id number]   cc:   Donna Howe, M.D.   Donna Howe  [place], [place] [address]  Fax: [phone]

## 2011-02-03 NOTE — Cardiovascular Report (Signed)
NAMEZEAH, VANCLEAVE NO.:  [id number]   MEDICAL RECORD NO.:  [id number]          PATIENT TYPE:  INP   LOCATION:  6522                         FACILITY:  [facility]   PHYSICIAN:  Junious Silk, M.D. LHCDATE OF BIRTH:  1955/12/08   DATE OF PROCEDURE:  06/21/2004  DATE OF DISCHARGE:                              CARDIAC CATHETERIZATION   PROCEDURES PERFORMED:  Left heart catheterization with coronary angiography,  bypass graft angiography and left ventriculography.   INDICATION:  Ms. Joplin is a 55 year old woman with diabetes.  She is  status post coronary artery bypass surgery performed in February of this  year after she presented with critical left main and ostial right coronary  artery disease.  She then presented 2 months later with recurrent chest  pain.  Cardiac catheterization revealed occlusion of the distal limb of the  sequential saphenous vein graft to an obtuse marginal and the posterolateral  branch.  There is also significant disease in the anastomosis of the vein  graft to the obtuse marginal itself.  We initially performed PTCA of the  distal anastomosis, however, patient presented 2 months later with  restenosis at the PTCA site.  We therefore, after extensive review and  discussion with the patient, opted to proceed with stenting of the native  left main coronary artery as well as stenting of the distal anastomosis of  vein graft to the obtuse marginal.  These were both performed successfully.  The stent in the left main artery was extending into the proximal left  circumflex in what was a dominant left circumflex.  It was extended across  the left anterior descending artery and obtuse marginal, however, both these  vessels were grafted.  We did perform PTCA of the ostium to the left  anterior descending artery as well as the proximal obtuse marginal at the  time of stenting.  The patient was then brought back and had a stent placed  in the  distal anastomosis of the saphenous vein graft to the obtuse marginal  branch.  These were drug-eluting stent placed in both sites.  She states  that since then, she has continued to have episodes of chest pain.  Her  chest pain can occur at rest or with exertion.  Because of her persistent  chest pain, she returned today for a repeat catheterization.   PROCEDURAL NOTE:  A 6-French sheath was placed in the right femoral artery.  Left circumflex artery was imaged with a 6-French JL4 catheter.  The right  coronary artery was imaged with a No-Torque Right catheter.  The saphenous  vein graft to the right coronary artery was imaged with an RCB catheter.  The saphenous vein graft to the obtuse marginal was imaged with a JR4  catheter.  The internal mammary graft was imaged with an internal mammary  catheter.  Left ventriculography was performed with an angled pigtail  catheter.  Contrast was Omnipaque.  There were no complications.   RESULTS:   HEMODYNAMICS:  Left ventricular pressure 115/16, aortic pressure 158/80.  There was no aortic valve gradient.   LEFT VENTRICULOGRAM:  There  is mild akinesis of the inferior wall.  Otherwise, wall motion is normal.  Ejection fraction is estimated at greater  than or equal to 60%.  There is trace mitral regurgitation.   CORONARY ARTERIOGRAPHY (LEFT DOMINANT):  The left main has a stent which  extends from the left main into the proximal portion of the left circumflex.  In the left main, there is 0% stenosis within the stent.  In the very  proximal circumflex, there is less than 30% stenosis within the stent.  The  stent is otherwise patent.   The left anterior descending artery has a 95% stenosis at the ostium.  Further down in the proximal vessel, there is a 20% stenosis.  The mid LAD  has a 40% stenosis, followed by a long 70% stenosis.  Beyond this, the  distal LAD fills via left internal mammary graft.  The LAD gives rise to a  normal-sized  bifurcating first diagonal branch and a small second diagonal  branch.   Left circumflex is a large dominant vessel.  There is a patent stent in the  circumflex with a less than 30% stenosis within the stented portion of the  circumflex.  In the mid-circumflex, there are minor luminal irregularities.  The circumflex gives rise to a large first obtuse marginal branch.  There is  an 80% stenosis in the ostium of the first obtuse marginal branch.  This  distal portion of this obtuse marginal fills the saphenous vein graft.  The  distal circumflex gives rise to a normal-sized bifurcating first  posterolateral branch, a small second posterolateral branch and a normal-  sized posterior descending artery.  The first posterolateral branch has a  95% stenosis in an area where the vessel is actually tented up.  This  appears to be where the distal anastomosis with the sequential saphenous  vein graft was placed.  This distal limb of this vein graft is now occluded.  The vessel beyond this 95% stenosis is very small in caliber, being  approximately 1 mm in diameter.   Right coronary artery is a non-dominant vessel.  There is a 90% stenosis in  the ostium of the right coronary artery.  The mid and distal right coronary  fill via saphenous vein graft.   Left internal mammary artery to the distal LAD is patent throughout its  course.  This fills the mid and distal LAD, including retrograde to the mid  LAD and filling both diagonal branches.  There is a long 70% stenosis in the  mid LAD proximal to the internal mammary insertion.  In the apical LAD,  there is a 40% stenosis.   Saphenous vein graft to the obtuse marginal is patent at the stent site of  the distal anastomosis.  There appears to be approximately a 40% area of  stenosis in the very proximal portion of this stent which was placed to the distal anastomosis.  It is otherwise patent, filling the distal portion of  the large obtuse marginal  branch.  There are minor luminal irregularities  more proximal in the vein graft.  The distal limb of this vein graft which  was anastomosed to the posterolateral branch is 100% occluded, as was seen  on previous catheterization.   Saphenous vein graft to the mid right coronary artery is patent with only  minor luminal irregularities in this vein graft.   IMPRESSION:  1.  Preserved left ventricular systolic function.  2.  Native three-vessel coronary artery disease.  3.  Status post coronary artery bypass surgery.  There is a patent internal      mammary artery to the distal left anterior descending and a patent vein      graft to the right coronary artery.  The vein graft to the obtuse      marginal was patent with mild restenosis at the stent site, but      otherwise patent stent into obtuse marginal branch.  The distal limb of      the vein graft which was anastomosed to the posterolateral branch is      occluded, as was seen on previous catheterization.  In the native      circulation, there is a patent stent in the left main extending into the      left circumflex coronary artery.   It is not clear what the source of the patient's chest pain is, however, I  suspect it may be the posterolateral branch which has the 95% stenosis.  Because of the severe angulation at the area of disease and the small vessel  beyond it, this is not amenable to percutaneous coronary intervention.  There is significant disease in the ostium of the left anterior descending  artery, however, the LAD appears to be well-perfused via the left internal  mammary graft.  There is also significant disease in the ostium of the  obtuse marginal, however, the distal obtuse marginal appears to be well-  perfused via saphenous vein graft.   PLAN:  We will proceed with an adenosine Cardiolite to identify and localize  ischemia.  If ischemia is localized to the posterolateral wall, would  recommend medical therapy.   If there is any evidence of ischemia in the  proximal LAD territory, we will need to review the cines further to see if  there would be percutaneous options for therapy.       MWP/MEDQ  D:  06/21/2004  T:  06/21/2004  Job:  [id number]   cc:   Rory Percy  [place], [place] [address]  Fax: [phone]   [place], [facility]

## 2011-02-03 NOTE — Op Note (Signed)
[name], Donna Howe              ACCOUNT NO.:  [id number]   MEDICAL RECORD NO.:  [id number]          PATIENT TYPE:  AMB   LOCATION:  SDS                          FACILITY:  [facility]   PHYSICIAN:  Tarri Glenn, M.D.  DATE OF BIRTH:  11-Jan-1956   DATE OF PROCEDURE:  08/30/2005  DATE OF DISCHARGE:  08/30/2005                                 OPERATIVE REPORT   PREOPERATIVE DIAGNOSIS:  Adhesive capsulitis right shoulder.   POSTOPERATIVE DIAGNOSIS:  Adhesive capsulitis right shoulder.   OPERATION:  Closed manipulation right shoulder and injection with Marcaine  and steroid in the subacromial space.   SURGEON:  Tarri Glenn, M.D.   ASSISTANT:  Nurse.   ANESTHESIA:  General anesthesia.   INDICATIONS FOR PROCEDURE:  I previously performed a closed manipulation of  her left shoulder but in conjunction with shoulder arthroscopy and  subacromial decompression, but in the left shoulder, she had findings of  chronic impingement syndrome and her right shoulder MRI of August 15, 2005, was rather unremarkable with nothing to suggest the need for an  arthroscopy.  She was also offered the alternative of outpatient physical  therapy for her right shoulder but it was her feeling that it did not work  on her left shoulder and because of her pain, she would like to proceed with  today's surgery.   DESCRIPTION OF PROCEDURE:  Under satisfactory general anesthesia, once  asleep, she did have an improvement in motion but still had restriction with  only about 90 degrees of passive abduction and flexion and 0 degrees of  internal rotation with 90 degrees of external rotation.  I then gently began  manipulation process going from flexion to extension, improving her  abduction and also trying to improve her rotation.  In going from flexion to  extension, there was an audible and palpable lysis of adhesions and this  allowed me to bring her arm up to 180 degrees or normal extension and also  bring  her up passively to at least 135 degrees of abduction or normal  abduction.  Also her rotation improved from 0 to 90 degrees of internal  rotation with external rotation staying at 90 degrees.  I then injected the  subacromial space following Betadine prep with 10 mL of 0.5% of plain  Marcaine and 80 mg of Kenalog.  She was advised preoperatively to observe  her sugars.  At the time of this dictation, she had arrived to the PACU with  no known complications.           ______________________________  Tarri Glenn, M.D.     JA/MEDQ  D:  08/30/2005  T:  08/31/2005  Job:  [id number]

## 2011-02-03 NOTE — Op Note (Signed)
Donna Howe, Donna Howe              ACCOUNT NO.:  [id number]   MEDICAL RECORD NO.:  [id number]          PATIENT TYPE:  OIB   LOCATION:  5011                         FACILITY:  [facility]   PHYSICIAN:  Tarri Glenn, M.D.  DATE OF BIRTH:  12-22-1955   DATE OF PROCEDURE:  11/18/2004  DATE OF DISCHARGE:                                 OPERATIVE REPORT   PREOPERATIVE DIAGNOSES:  1.  Adhesive capsulitis left shoulder.  2.  Chronic impingement syndrome with rotator cuff tendinopathy left      shoulder.   POSTOPERATIVE DIAGNOSES:  1.  Adhesive capsulitis left shoulder.  2.  Chronic impingement syndrome with rotator cuff tendinopathy left      shoulder.   OPERATION:  1.  Closed manipulation of the left shoulder.  2.  Left shoulder arthroscopy with normal glenohumeral examination and      arthroscopic subacromial decompression.   SURGEON:  Tarri Glenn, M.D.   ASSISTANTElodia Florence. Vanita Ingles.   ANESTHESIA:  General anesthesia.   INDICATIONS FOR PROCEDURE:  She had pain and limitation of motion of the  left shoulder with MRI demonstrating rotator cuff tendinopathy secondary to  impingement.  She was cleared for surgery by her cardiologist but the  patient and family understand that there is some risk involved because of  her overall cardiac condition.   DESCRIPTION OF PROCEDURE:  After satisfactory general anesthesia, I first  manipulated her left shoulder.  She initially had only about 90 degrees of  abduction as well as about 45 degrees of external rotation and internal  rotation and 90 degrees of flexion.  I progressively manipulated the four  motions and obtained a loud popping and complete release of her restriction  of motion with the abduction maneuver.  Also had some release of adhesions  particularly with internal rotation.  At the conclusion of this  manipulation, she had full shoulder motion.  I then prepped her left  shoulder girdle with DuraPrep and draped as a  sterile field.  Anatomy of the  shoulder joint was marked out.  The subacromial space, lateral and posterior  portals were infiltrated with 0.5% Marcaine with adrenalin.  Through a  posterior soft spot portal and blunt trocar, I traumatically entered the  glenohumeral joint.  On inspection it was normal.  I then redirected the  scope into the subacromial space and through a lateral portal, I used the  blunt trocar followed by 4.2 shaver to remove a good bit of bursal tissue  and then introduced the ArthroCare 90 degree vaporizer removing soft tissue  from the underneath surface of the acromion distal clavicle.  Following  this, I used a 4-0 oval bur and began burring down and decompressing the  subacromial space and distal clavicle.  I alternated between the bur, the  vaporizer and the 4.2 shaver until we had a nice clean wide subacromial  decompression.  Documentary pictures were taken with arm to her side and the  arm abducted.  All fluid possible was then removed from  subacromial space.  The two portals were closed with 4-0 nylon and  infiltrated with 0.5% Marcaine with adrenalin as was the subacromial space.  Betadine, Adaptic and dry sterile dressing and a sling were applied.  She  tolerated the procedure well and was taken to the recovery room in  satisfactory condition with no known complications.      JA/MEDQ  D:  11/18/2004  T:  11/18/2004  Job:  [id number]

## 2011-02-03 NOTE — H&P (Signed)
NAMEELY, BONIFAS NO.:  [id number]   MEDICAL RECORD NO.:  [id number]                   PATIENT TYPE:  INP   LOCATION:  4727                                 FACILITY:  [facility]   PHYSICIAN:  Minus Breeding, M.D.                DATE OF BIRTH:  13-May-1956   DATE OF ADMISSION:  01/17/2004  DATE OF DISCHARGE:                                HISTORY & PHYSICAL   PHYSICIANS:  Primary cardiologist: Dr. Lennice Sites  Primary care physician:  Rory Percy, M.D.   CHIEF COMPLAINT:  Chest pain status post recent coronary artery bypass  grafting.   HISTORY OF PRESENT ILLNESS:  A 55 year old female, obese, type 2 diabetes,  hyperlipidemia, hypertension, history of tobacco (quit with coronary artery  bypass grafting), positive family history of early coronary artery disease  (brother with CABG at 68 years old).  Status post cardiac catheterization  (February 2005) with an 80% left main lesions extending into both ostia of  the LAD and left circumflex, left dominant circulation, 70% ostial  nondominant right coronary artery, status post four-vessel CABG with LIMA to  the LAD, saphenous vein graft to the left circumflex artery with sequential  graft to an OM-1 (OM-1 had a 40% lesion noted at catheterization), saphenous  vein graft to right coronary artery.  The patient was discharged home and  enrolled in cardiac rehabilitation and was doing quite well, walking on the  treadmill up to 30 minutes per day.  However, over the past two days, the  patient has had approximately five to six episodes of chest pain,  throbbing in character, accompanied by left arm numbness and radiation to  left shoulder and back, lasting approximately 30 minutes to 1 hour and  approximately 5 to 6/10 in severity.  The patient reports experiencing some  symptoms at rest; however, has noted worse symptoms with exertion  accompanied by shortness of breath and dyspnea on exertion.  The patient  denies  diaphoresis, nausea or vomiting.  The patient has noted no relieving  factors other than spontaneous resolution with rest.  She has not taken  nitroglycerin.  She is currently chest pain free.  Her EKG reveals no  changes but diagnostic of ischemia or infarction.  Her first set of CK, MB,  and troponin cardiac markers are negative x 1.  The patient reports that the  symptoms she is having are somewhat different from those she experienced  prior to CABG, although she has some difficulty explaining exactly how they  are different.  She notes that the chest pain she had prior to CABG was a  constant, stabbing type pain; whereas the current pain she is having is more  throbbing in nature; however, again, she has some difficulty in describing  the details of the discomfort and how these episodes have related.   PAST MEDICAL HISTORY:  1. Early coronary artery disease with positive family history  of early     coronary artery disease status post four-vessel coronary artery bypass     grafting (February 2005), with a LIMA to LAD, saphenous vein graft to     left circumflex, sequential graft to OM-1, saphenous vein graft to the     RCA (Dr. Cyndia Bent) with pre-CABG cardiac catheterization revealing a left     main 80% lesion extending into the ostia of the LAD and left circumflex,     left circumflex distribution notable for a 40% stenosis in the OM-1, left     circumflex circulation dominant, right coronary artery nondominant with a     70% ostial lesion, left ventricular ejection fraction normal, with no     mitral regurgitation and no significant aortic valve gradient.  2. Type 2 diabetes/obesity.  3. Hyperlipidemia.  4. Hypertension.  5. Tobacco with history of bronchitis.   SOCIAL HISTORY:  The patient lives in [place] with her son.  She works in a  Production designer, theatre/television/film.  She is divorced.  She has one child, age 67.  The patient  previously smoked one to two packs per day for approximately 20 years;   however, quit at the time of her presentation for coronary artery bypass  grafting.   FAMILY HISTORY:  Mother is alive at age 32 with diabetes, heart trouble,  and hypertension.  Father is alive at age 65 with COPD.  The patient has one  brother who had coronary artery bypass grafting at age 40 without diabetes.   ALLERGIES:  Allergy/adverse reaction to PENICILLIN (PENICILLIN allergy was  noted in childhood.  The patient does not recall the reaction.   MEDICATIONS:  1. Glucotrol XL 10 mg p.o. daily.  2. Glucophage 1 g p.o. b.i.d.  3. Aspirin 325 mg p.o. daily.  4. Lopressor 25 mg p.o. b.i.d.  5. Lipitor 40 mg p.o. q.h.s.  6. Lisinopril 10 mg p.o. daily.   REVIEW OF SYSTEMS:  Essentially negative except as described in HPI and Past  Medical History.  The patient is not reporting any fevers, chills, or sweats  recently.  HEENT:  She is normocephalic and atraumatic.  Extraocular eye  movements are intact.  Pupils equal, round, and reactive to light.  Oropharynx has fair dentition with several cavity fillings noted.  There are  no lesions appreciated.  SKIN:  No acute rash.  CARDIOVASCULAR:  Most notable for complaint of chest  pain, shortness of breath, dyspnea on exertion as previously described.  She  reports no PND. She has had some edema in her legs from which the vein  grafts were harvested; however, she denies any presyncope, syncope,  claudication.  No cough or wheezing.  The patient has no bowel or bladder  changes.  NEUROPSYCHIATRIC:  Status is stable.  She has no reports of heat  or cold intolerance nor does she have report of recent weight gain or weight  loss.  All other symptoms are negative.   PHYSICAL EXAMINATION:  VITAL SIGNS:  Temperature 98.8, heart rate 78,  respiratory rate 20, blood pressure 130/80, oxygen saturation 96% on room  air.  GENERAL:  The patient is pleasant and cooperative. She answers questions  without difficulty. She is in no apparent  distress. HEENT:  She is normocephalic and atraumatic.  Extraocular eye movements are  intact.  Pupils equal, round, and reactive to light.  Oropharynx is without  marked abnormality beyond multiple cavities status post fillings with  overall fair to poor dentition.  NECK:  Supple.  She has no evidence of bruit. There was no jugular venous  distention.  CARDIOVASCULAR:  Regular S1 and regular S2 with faint systolic flow murmur  that does not radiate.  LUNGS:  Clear bilaterally.  SKIN:  No acute rash and a well healing median sternotomy wound.  ABDOMEN:  Soft, nontender, nondistended, obese, with positive bowel sounds.  EXTREMITIES:  2+ femoral pulses symmetric bilaterally with no evidence of  bruit.  On examination, the patient's extremity examination is notable for  trace edema.  NEUROLOGIC:  Grossly nonfocal. Strength and sensation is grossly intact  throughout.  She is able to move all four extremities without difficulty.  She is alert and appropriate.   LABORATORY AND X-RAY DATA:  EKG is normal sinus rhythm with a rate of 68.  Axis is normal.  The PR, QRS, and QTC durations are  within normal limits.  There is evidence of left atrial enlargement, but there are no changes that  are diagnostic of ischemia or infarction.  There are nonspecific ST and T  wave abnormalities noted.  There is no significant evident of left  ventricular hypertrophy on EKG.   White blood cell count 9, hematocrit 35, platelet count 226.  Sodium 139,  potassium 3.9, chloride 103, bicarb 28, BUN 15, creatinine 0.8, glucose 99.  CK-MB 1.8, troponin I less than 0.05.  White blood cell count differential  revealed 55% segmented neutrophils.   IMPRESSION AND PLAN:  A 55 year old who is obese with type 2 diabetes,  hypertension, hyperlipidemia, quit tobacco.  Positive family history of  early coronary artery disease with coronary artery bypass grafting in  February 2005, cardiac rehabilitation interrupted by two  days of chest pain  occurring at rest but worse with exertion with positive dyspnea on exertion.   1. Chest pain status post coronary artery bypass grafting.  Admit on     telemetry, rule out myocardial infarction with serial cardiac markers and     EKG.  Administer aspirin, beta blocker, ACE inhibitor, statin, IV     heparin, and nitroglycerin paste while making patient n.p.o. in     anticipation of probable cardiac catheterization in the morning.  2. Obesity, type 2 diabetes.  Will hold metformin in anticipation of     possible cardiac catheterization.  Hold Glucotrol while n.p.o.  When     patient's diet is resumed, will maintain her on a diabetic diet with Accu-     Chek q.a.c. and sliding scale insulin as needed.  Will encourage weight     loss and exercise following cardiovascular evaluation.  3. Status post recent tobacco cessation at time of cardiac catheterization     and subsequently coronary artery bypass grafting. Will continue to     reinforce the importance of tobacco cessation. 4. History of early coronary artery disease and family history of early     coronary artery disease.  Will check a homocysteine with a.m. labs.  5. Hyperlipidemia.  Will continue Lipitor as previously prescribed and check     a lipid profile with LFTs in the morning to assure that patient's LDL is     at goal (LDL less than 70) and to assure adequate hepatic tolerance of     this medication.  6. Rehabilitation.  Following cardiovascular evaluation to rule out any     significant ischemia, will continue to continue to encourage patient's     participation in cardiac rehabilitation.      March Rummage, MD  Minus Breeding, M.D.    DDH/MEDQ  D:  01/18/2004  T:  01/18/2004  Job:  [id number]   cc:   Rory Percy, M.D.  [place]   Dr. Paulette [facility], [facility]

## 2011-03-01 ENCOUNTER — Emergency Department (HOSPITAL_COMMUNITY)
Admission: EM | Admit: 2011-03-01 | Discharge: 2011-03-01 | Disposition: A | Discharge status: Home / Self Care | Payer: Medicare Other | Attending: Emergency Medicine | Admitting: Emergency Medicine

## 2011-03-01 DIAGNOSIS — J40 Bronchitis, not specified as acute or chronic: Secondary | ICD-10-CM | POA: Insufficient documentation

## 2011-03-01 DIAGNOSIS — J3489 Other specified disorders of nose and nasal sinuses: Secondary | ICD-10-CM | POA: Insufficient documentation

## 2011-03-01 DIAGNOSIS — I252 Old myocardial infarction: Secondary | ICD-10-CM | POA: Insufficient documentation

## 2011-03-01 DIAGNOSIS — Z951 Presence of aortocoronary bypass graft: Secondary | ICD-10-CM | POA: Insufficient documentation

## 2011-03-01 DIAGNOSIS — M549 Dorsalgia, unspecified: Secondary | ICD-10-CM | POA: Insufficient documentation

## 2011-03-01 DIAGNOSIS — R059 Cough, unspecified: Secondary | ICD-10-CM | POA: Insufficient documentation

## 2011-03-01 DIAGNOSIS — E7800 Pure hypercholesterolemia, unspecified: Secondary | ICD-10-CM | POA: Insufficient documentation

## 2011-03-01 DIAGNOSIS — E119 Type 2 diabetes mellitus without complications: Secondary | ICD-10-CM | POA: Insufficient documentation

## 2011-03-01 DIAGNOSIS — R05 Cough: Secondary | ICD-10-CM | POA: Insufficient documentation

## 2011-03-01 DIAGNOSIS — Z9861 Coronary angioplasty status: Secondary | ICD-10-CM | POA: Insufficient documentation

## 2011-03-01 DIAGNOSIS — R079 Chest pain, unspecified: Secondary | ICD-10-CM | POA: Insufficient documentation

## 2011-03-01 DIAGNOSIS — I2510 Atherosclerotic heart disease of native coronary artery without angina pectoris: Secondary | ICD-10-CM | POA: Insufficient documentation

## 2011-03-01 DIAGNOSIS — I10 Essential (primary) hypertension: Secondary | ICD-10-CM | POA: Insufficient documentation

## 2011-04-19 ENCOUNTER — Emergency Department (HOSPITAL_COMMUNITY)
Admission: EM | Admit: 2011-04-19 | Discharge: 2011-04-19 | Disposition: A | Discharge status: Home / Self Care | Payer: Medicare Other | Attending: Emergency Medicine | Admitting: Emergency Medicine

## 2011-04-19 DIAGNOSIS — K089 Disorder of teeth and supporting structures, unspecified: Secondary | ICD-10-CM | POA: Insufficient documentation

## 2011-04-19 DIAGNOSIS — Z794 Long term (current) use of insulin: Secondary | ICD-10-CM | POA: Insufficient documentation

## 2011-04-19 DIAGNOSIS — K047 Periapical abscess without sinus: Secondary | ICD-10-CM | POA: Insufficient documentation

## 2011-04-19 DIAGNOSIS — K0889 Other specified disorders of teeth and supporting structures: Secondary | ICD-10-CM

## 2011-04-19 DIAGNOSIS — I10 Essential (primary) hypertension: Secondary | ICD-10-CM | POA: Insufficient documentation

## 2011-04-19 DIAGNOSIS — F17200 Nicotine dependence, unspecified, uncomplicated: Secondary | ICD-10-CM | POA: Insufficient documentation

## 2011-04-19 DIAGNOSIS — E119 Type 2 diabetes mellitus without complications: Secondary | ICD-10-CM | POA: Insufficient documentation

## 2011-04-19 MED ORDER — OXYCODONE-ACETAMINOPHEN 5-325 MG PO TABS
1.0000 | ORAL_TABLET | Freq: Once | ORAL | Status: AC
  Administered 2011-04-19: 1 via ORAL

## 2011-04-19 MED ORDER — CLINDAMYCIN HCL 150 MG PO CAPS: 300.0000 mg | ORAL_CAPSULE | Freq: Four times a day (QID) | ORAL | Status: AC

## 2011-04-19 MED ORDER — OXYCODONE-ACETAMINOPHEN 5-325 MG PO TABS: 1.0000 | ORAL_TABLET | Freq: Four times a day (QID) | ORAL | Status: AC | PRN

## 2011-04-19 NOTE — ED Notes (Signed)
Toothache to left lower for 2 days

## 2011-04-19 NOTE — ED Provider Notes (Addendum)
History     Chief Complaint  Patient presents with  . Dental Pain   Patient is a 55 y.o. female presenting with tooth pain. The history is provided by the patient.  Dental PainThe primary symptoms include mouth pain. Primary symptoms do not include oral bleeding, headaches or cough. Primary symptoms comment: 60s and a toothache now for a couple days in her left lower mouth The symptoms began 2 days ago. The symptoms are unchanged. The symptoms are new. The symptoms occur constantly.  Additional symptoms include: gum tenderness. Additional symptoms do not include: pain with swallowing and fatigue. Medical issues do not include: alcohol problem.    Past Medical History  Diagnosis Date  . Hypercholesteremia   . Hypertension   . Diabetes mellitus     Past Surgical History  Procedure Date  . Cardiac surgery   . Cholecystectomy   . Hernia repair   . Cesarean section     No family history on file.  History  Substance Use Topics  . Smoking status: Current Everyday Smoker  . Smokeless tobacco: Not on file  . Alcohol Use: No    OB History    Grav Para Term Preterm Abortions TAB SAB Ect Mult Living                  Review of Systems  Constitutional: Negative for fatigue.  HENT: Negative for congestion, sinus pressure and ear discharge.        Tender left lower molar  Eyes: Negative for discharge.  Respiratory: Negative for cough.   Cardiovascular: Negative for chest pain.  Gastrointestinal: Negative for abdominal pain and diarrhea.  Genitourinary: Negative for frequency and hematuria.  Musculoskeletal: Negative for back pain.  Skin: Negative for rash.  Neurological: Negative for seizures and headaches.  Hematological: Negative.   Psychiatric/Behavioral: Negative for hallucinations.    Physical Exam  BP 197/85  Pulse 71  Temp(Src) 97.5 F (36.4 C) (Oral)  Resp 18  Ht 5\' 6"  (1.676 m)  Wt 225 lb (102.059 kg)  BMI 36.32 kg/m2  SpO2 99%  Physical Exam    Constitutional: She is oriented to person, place, and time. She appears well-developed.  HENT:  Head: Atraumatic.       Tender molar left upper  Eyes: Conjunctivae and EOM are normal. No scleral icterus.  Neck: Neck supple. No thyromegaly present.  Cardiovascular: Normal rate and regular rhythm.  Exam reveals no gallop and no friction rub.   No murmur heard. Pulmonary/Chest: No stridor. She has no wheezes. She has no rales. She exhibits no tenderness.  Abdominal: She exhibits no distension. There is no tenderness. There is no rebound.  Musculoskeletal: Normal range of motion. She exhibits no edema.  Lymphadenopathy:    She has no cervical adenopathy.  Neurological: She is oriented to person, place, and time. Coordination normal.  Skin: No rash noted. No erythema.  Psychiatric: She has a normal mood and affect. Her behavior is normal.    ED Course  Procedures  MDM abscess tooth      Maudry Diego, MD 04/19/11 [id number]  Maudry Diego, MD 05/17/11 1045

## 2011-04-19 NOTE — ED Notes (Signed)
Pt requesting to know how much longer before she is seen. Advised that by looking at this computer she should been seen next.

## 2011-04-19 NOTE — ED Notes (Signed)
Pt states her pcp

## 2011-05-18 ENCOUNTER — Encounter: Payer: Self-pay | Admitting: Endocrinology

## 2011-05-18 ENCOUNTER — Ambulatory Visit (INDEPENDENT_AMBULATORY_CARE_PROVIDER_SITE_OTHER): Payer: Medicare Other | Admitting: Endocrinology

## 2011-05-18 ENCOUNTER — Encounter: Payer: Self-pay | Admitting: *Deleted

## 2011-05-18 ENCOUNTER — Other Ambulatory Visit (INDEPENDENT_AMBULATORY_CARE_PROVIDER_SITE_OTHER): Payer: Medicare Other

## 2011-05-18 VITALS — BP 110/78 | HR 67 | Temp 98.0°F | Ht 66.0 in | Wt 214.8 lb

## 2011-05-18 DIAGNOSIS — E119 Type 2 diabetes mellitus without complications: Secondary | ICD-10-CM

## 2011-05-18 LAB — TSH: TSH: 3.61 u[IU]/mL (ref 0.35–5.50)

## 2011-05-18 NOTE — Progress Notes (Signed)
  Subjective:    Patient ID: Donna Howe, female    DOB: 12/19/1955, 55 y.o.   MRN: [id number]  HPI pt states she feels well in general.  no cbg record, but states cbg's are in the 200's.  It is highest in am and at lunch.   Past Medical History  Diagnosis Date  . Hypercholesteremia   . Hypertension   . Diabetes mellitus   . DIABETES MELLITUS, TYPE II 03/31/2007  . HYPERLIPIDEMIA 03/31/2007  . Overweight 08/26/2009  . HYPERTENSION 03/31/2007  . CORONARY ARTERY DISEASE 03/31/2007  . ASTHMA 08/26/2009  . RENAL FAILURE 03/31/2007  . Gout   . S/p small bowel obstruction 01/07/2010    Past Surgical History  Procedure Date  . Cardiac surgery   . Cholecystectomy   . Hernia repair   . Cesarean section   . Cardiac catheterization 07/18/2004  . Coronary artery bypass graft 2005    History   Social History  . Marital Status: Divorced    Spouse Name: N/A    Number of Children: N/A  . Years of Education: N/A   Occupational History  . Disabled    Social History Main Topics  . Smoking status: Current Everyday Smoker  . Smokeless tobacco: Not on file  . Alcohol Use: No  . Drug Use: No  . Sexually Active:    Other Topics Concern  . Not on file   Social History Narrative  . No narrative on file    Current Outpatient Prescriptions on File Prior to Visit  Medication Sig Dispense Refill  . insulin lispro (HUMALOG) 100 UNIT/ML injection Inject into the skin 3 (three) times daily before meals. 15 units at breakfast, 5 units with lunch, 20 units with supper      . insulin NPH (HUMULIN N,NOVOLIN N) 100 UNIT/ML injection Inject 40 Units into the skin at bedtime.         Allergies  Allergen Reactions  . Amoxicillin     REACTION: unspecified  . Penicillins     REACTION: Unknown    Family History  Problem Relation Age of Onset  . Diabetes Mother   . Heart disease Mother   . Hyperlipidemia Mother   . Thyroid disease Father     BP 110/78  Pulse 67  Temp(Src) 98 F (36.7  C) (Oral)  Ht 5\' 6"  (1.676 m)  Wt 214 lb 12.8 oz (97.433 kg)  BMI 34.67 kg/m2  SpO2 95%    Review of Systems denies hypoglycemia    Objective:   Physical Exam VITAL SIGNS:  See vs page GENERAL: no distress Pulses: dorsalis pedis intact bilat.   Feet: no deformity.  no ulcer on the feet.  feet are of normal color and temp.  no edema Neuro: sensation is intact to touch on the feet    Lab Results  Component Value Date   TSH 3.61 05/18/2011  '    Assessment & Plan:  Dm, she needs some adjustment in her therapy

## 2011-05-18 NOTE — Patient Instructions (Addendum)
blood tests are being requested for you today.  please call [phone] to hear your test results.  You will be prompted to enter the 9-digit "MRN" number that appears at the top left of this page, followed by #.  Then you will hear the message. pending the test results, please continue humalog (just before each meal) 15-5-20 units, and increase nph to 40 units at bedtime. good diet and exercise habits significanly improve the control of your diabetes.  please let me know if you wish to be referred to a dietician.  high blood sugar is very risky to your health.  you should see an eye doctor every year. controlling your blood pressure and cholesterol drastically reduces the damage diabetes does to your body.  this also applies to quitting smoking.  please discuss these with your doctor.  you should take an aspirin every day, unless you have been advised by a doctor not to. check your blood sugar 2 times a day.  vary the time of day when you check, between before the 3 meals, and at bedtime.  also check if you have symptoms of your blood sugar being too high or too low.  please keep a record of the readings and bring it to your next appointment here.  please call us sooner if you are having low blood sugar episodes. Please make a follow-up appointment in 3 months (update: i left message on phone-tree:  rx as we discussed)

## 2011-06-13 LAB — URINALYSIS, ROUTINE W REFLEX MICROSCOPIC
Hgb urine dipstick: NEGATIVE
Protein, ur: NEGATIVE
Urobilinogen, UA: 1

## 2011-06-13 LAB — DIFFERENTIAL
Basophils Absolute: 0
Basophils Relative: 0
Eosinophils Relative: 0
Monocytes Absolute: 0.2
Neutro Abs: 8 — ABNORMAL HIGH

## 2011-06-13 LAB — CBC
HCT: 39.8
Hemoglobin: 13.8
MCV: 84.4
Platelets: 148 — ABNORMAL LOW
RBC: 4.71
WBC: 9.2

## 2011-06-13 LAB — COMPREHENSIVE METABOLIC PANEL
Albumin: 3.9
Alkaline Phosphatase: 116
BUN: 29 — ABNORMAL HIGH
CO2: 26
Chloride: 103
GFR calc non Af Amer: 27 — ABNORMAL LOW
Potassium: 4.4
Total Bilirubin: 0.8

## 2011-06-13 LAB — LIPASE, BLOOD: Lipase: 17

## 2011-08-03 ENCOUNTER — Ambulatory Visit (INDEPENDENT_AMBULATORY_CARE_PROVIDER_SITE_OTHER): Payer: Medicare Other | Admitting: Endocrinology

## 2011-08-03 ENCOUNTER — Encounter: Payer: Self-pay | Admitting: Endocrinology

## 2011-08-03 VITALS — BP 124/82 | HR 65 | Temp 98.8°F | Ht 66.0 in | Wt 218.0 lb

## 2011-08-03 DIAGNOSIS — E119 Type 2 diabetes mellitus without complications: Secondary | ICD-10-CM

## 2011-08-03 NOTE — Progress Notes (Signed)
Subjective:    Patient ID: Donna Howe, female    DOB: [birth year], 55 y.o.   MRN: [id number]  HPI Pt trurns for f/u of insulin-requiring DM (2002).  She has run out of nph insulin.  pt states he feels well in general.  no cbg record, but states cbg's are over 200 since she ran out of nph insulin.  When she was on it, cbg's were in the high-100's in am, but slightly lower than in am.   Past Medical History  Diagnosis Date  . Hypercholesteremia   . Hypertension   . Diabetes mellitus   . DIABETES MELLITUS, TYPE II 03/31/2007  . HYPERLIPIDEMIA 03/31/2007  . Overweight 08/26/2009  . HYPERTENSION 03/31/2007  . CORONARY ARTERY DISEASE 03/31/2007  . ASTHMA 08/26/2009  . RENAL FAILURE 03/31/2007  . Gout   . S/p small bowel obstruction 01/07/2010    Past Surgical History  Procedure Date  . Cardiac surgery   . Cholecystectomy   . Hernia repair   . Cesarean section   . Cardiac catheterization 07/18/2004  . Coronary artery bypass graft 2005    History   Social History  . Marital Status: Divorced    Spouse Name: N/A    Number of Children: N/A  . Years of Education: N/A   Occupational History  . Disabled    Social History Main Topics  . Smoking status: Current Everyday Smoker  . Smokeless tobacco: Not on file  . Alcohol Use: No  . Drug Use: No  . Sexually Active:    Other Topics Concern  . Not on file   Social History Narrative  . No narrative on file    Current Outpatient Prescriptions on File Prior to Visit  Medication Sig Dispense Refill  . aspirin 325 MG tablet Take 325 mg by mouth daily.        Marland Kitchen atorvastatin (LIPITOR) 80 MG tablet Take 80 mg by mouth daily.        . clopidogrel (PLAVIX) 75 MG tablet Take 75 mg by mouth daily.        . colchicine 0.6 MG tablet Take 0.6 mg by mouth as needed.       . furosemide (LASIX) 40 MG tablet Take 40 mg by mouth daily.        . insulin lispro (HUMALOG) 100 UNIT/ML injection Inject into the skin 3 (three) times daily before  meals. 15 units at breakfast, 5 units with lunch, 20 units with supper      . insulin NPH (HUMULIN N,NOVOLIN N) 100 UNIT/ML injection Inject 50 Units into the skin at bedtime.       . Insulin Pen Needle 31G X 8 MM MISC Use as directed three times a day       . isosorbide dinitrate (ISORDIL) 20 MG tablet 3 tablets by mouth two times daily       . metoprolol tartrate (LOPRESSOR) 25 MG tablet Take 25 mg by mouth 2 (two) times daily.        . nitroGLYCERIN (NITROSTAT) 0.4 MG SL tablet Place 0.4 mg under the tongue every 5 (five) minutes as needed. Not to exceed 3 in 15 minute time frame       . quinapril (ACCUPRIL) 5 MG tablet Take 5 mg by mouth daily.          Allergies  Allergen Reactions  . Amoxicillin     REACTION: unspecified  . Penicillins     REACTION: Unknown    Family History  Problem Relation Age of Onset  . Diabetes Mother   . Heart disease Mother   . Hyperlipidemia Mother   . Thyroid disease Father    BP 124/82  Pulse 65  Temp(Src) 98.8 F (37.1 C) (Oral)  Ht 5\' 6"  (1.676 m)  Wt 218 lb (98.884 kg)  BMI 35.19 kg/m2  SpO2 97%  Review of Systems denies hypoglycemia.    Objective:   Physical Exam VITAL SIGNS:  See vs page GENERAL: no distress SKIN:  Insulin injection sites at the anterior abdomen are normal     Assessment & Plan:  DM, therapy limited by noncompliance.  i'll do the best i can.

## 2011-08-03 NOTE — Patient Instructions (Addendum)
pending the test results, please continue humalog (just before each meal) 15-5-20 units, and increase nph to 50 units at bedtime. check your blood sugar 2 times a day.  vary the time of day when you check, between before the 3 meals, and at bedtime.  also check if you have symptoms of your blood sugar being too high or too low.  please keep a record of the readings and bring it to your next appointment here.  please call us sooner if you are having low blood sugar episodes. Please make a follow-up appointment in 3 months.

## 2011-11-03 ENCOUNTER — Encounter: Payer: Self-pay | Admitting: Endocrinology

## 2011-11-03 ENCOUNTER — Other Ambulatory Visit (INDEPENDENT_AMBULATORY_CARE_PROVIDER_SITE_OTHER): Payer: Medicare Other

## 2011-11-03 ENCOUNTER — Ambulatory Visit (INDEPENDENT_AMBULATORY_CARE_PROVIDER_SITE_OTHER): Payer: Medicare Other | Admitting: Endocrinology

## 2011-11-03 DIAGNOSIS — E119 Type 2 diabetes mellitus without complications: Secondary | ICD-10-CM

## 2011-11-03 LAB — HEMOGLOBIN A1C: Hgb A1c MFr Bld: 10.1 % — ABNORMAL HIGH (ref 4.6–6.5)

## 2011-11-03 NOTE — Patient Instructions (Addendum)
blood tests are being requested for you today.  please call [phone] to hear your test results.  You will be prompted to enter the 9-digit "MRN" number that appears at the top left of this page, followed by #.  Then you will hear the message. pending the test results, please continue humalog (just before each meal) 15-5-20 units, and continue nph, 50 units at bedtime.  If you eat a snack at bedtime, take 5 units of humalog with it.   check your blood sugar 2 times a day.  vary the time of day when you check, between before the 3 meals, and at bedtime.  also check if you have symptoms of your blood sugar being too high or too low.  please keep a record of the readings and bring it to your next appointment here.  please call us sooner if you are having low blood sugar episodes.   Please make a follow-up appointment in 3 months.   (update: i left message on phone-tree:  Take 10 units with hs-snack.  Increase nph to 60-qhs.  Please come back for a follow-up appointment in 6 weeks

## 2011-11-03 NOTE — Progress Notes (Signed)
Subjective:    Patient ID: Donna Howe, female    DOB: March 03, 1956, 56 y.o.   MRN: [id number]  HPI Pt trurns for f/u of insulin-requiring DM (2002).  no cbg record, but states cbg's are still highest in am (200).  She attributes this to hs-snack.  pt states she feels well in general. Past Medical History  Diagnosis Date  . Hypercholesteremia   . Hypertension   . Diabetes mellitus   . DIABETES MELLITUS, TYPE II 03/31/2007  . HYPERLIPIDEMIA 03/31/2007  . Overweight 08/26/2009  . HYPERTENSION 03/31/2007  . CORONARY ARTERY DISEASE 03/31/2007  . ASTHMA 08/26/2009  . RENAL FAILURE 03/31/2007  . Gout   . S/p small bowel obstruction 01/07/2010    Past Surgical History  Procedure Date  . Cardiac surgery   . Cholecystectomy   . Hernia repair   . Cesarean section   . Cardiac catheterization 07/18/2004  . Coronary artery bypass graft 2005    History   Social History  . Marital Status: Divorced    Spouse Name: N/A    Number of Children: N/A  . Years of Education: N/A   Occupational History  . Disabled    Social History Main Topics  . Smoking status: Current Everyday Smoker  . Smokeless tobacco: Not on file  . Alcohol Use: No  . Drug Use: No  . Sexually Active:    Other Topics Concern  . Not on file   Social History Narrative  . No narrative on file    Current Outpatient Prescriptions on File Prior to Visit  Medication Sig Dispense Refill  . aspirin 325 MG tablet Take 325 mg by mouth daily.        Marland Kitchen atorvastatin (LIPITOR) 80 MG tablet Take 80 mg by mouth daily.        . clopidogrel (PLAVIX) 75 MG tablet Take 75 mg by mouth daily.        . colchicine 0.6 MG tablet Take 0.6 mg by mouth as needed.       . furosemide (LASIX) 40 MG tablet Take 40 mg by mouth daily.        . insulin lispro (HUMALOG) 100 UNIT/ML injection Inject into the skin 3 (three) times daily before meals. 15 units at breakfast, 5 units with lunch, 20 units with supper      . insulin NPH (HUMULIN  N,NOVOLIN N) 100 UNIT/ML injection Inject 50 Units into the skin at bedtime.       . Insulin Pen Needle 31G X 8 MM MISC Use as directed three times a day       . isosorbide dinitrate (ISORDIL) 20 MG tablet 3 tablets by mouth two times daily       . metoprolol tartrate (LOPRESSOR) 25 MG tablet Take 25 mg by mouth 2 (two) times daily.        . nitroGLYCERIN (NITROSTAT) 0.4 MG SL tablet Place 0.4 mg under the tongue every 5 (five) minutes as needed. Not to exceed 3 in 15 minute time frame       . quinapril (ACCUPRIL) 5 MG tablet Take 5 mg by mouth daily.          Allergies  Allergen Reactions  . Amoxicillin     REACTION: unspecified  . Penicillins     REACTION: Unknown    Family History  Problem Relation Age of Onset  . Diabetes Mother   . Heart disease Mother   . Hyperlipidemia Mother   . Thyroid disease Father  BP 132/92  Pulse 76  Temp(Src) 97.1 F (36.2 C) (Oral)  Ht 5\' 7"  (1.702 m)  Wt 220 lb (99.791 kg)  BMI 34.46 kg/m2  SpO2 95%    Review of Systems denies hypoglycemia    Objective:   Physical Exam VITAL SIGNS:  See vs page GENERAL: no distress Pulses: dorsalis pedis intact bilat.   Feet: no deformity.  no ulcer on the feet.  feet are of normal color and temp.  no edema Neuro: sensation is intact to touch on the feet    Lab Results  Component Value Date   HGBA1C 10.1* 11/03/2011      Assessment & Plan:  DM.  needs increased rx

## 2012-02-02 ENCOUNTER — Ambulatory Visit (INDEPENDENT_AMBULATORY_CARE_PROVIDER_SITE_OTHER): Payer: Medicare Other | Admitting: Endocrinology

## 2012-02-02 ENCOUNTER — Encounter: Payer: Self-pay | Admitting: Endocrinology

## 2012-02-02 ENCOUNTER — Other Ambulatory Visit (INDEPENDENT_AMBULATORY_CARE_PROVIDER_SITE_OTHER): Payer: Medicare Other

## 2012-02-02 VITALS — BP 138/84 | HR 71 | Temp 98.9°F | Ht 66.0 in | Wt 220.0 lb

## 2012-02-02 DIAGNOSIS — E119 Type 2 diabetes mellitus without complications: Secondary | ICD-10-CM

## 2012-02-02 DIAGNOSIS — IMO0001 Reserved for inherently not codable concepts without codable children: Secondary | ICD-10-CM

## 2012-02-02 LAB — HEMOGLOBIN A1C: Hgb A1c MFr Bld: 9 % — ABNORMAL HIGH (ref 4.6–6.5)

## 2012-02-02 MED ORDER — INSULIN NPH (HUMAN) (ISOPHANE) 100 UNIT/ML ~~LOC~~ SUSP: 50.0000 [IU] | Freq: Every day | SUBCUTANEOUS | Status: DC

## 2012-02-02 NOTE — Patient Instructions (Signed)
pending the test results, please continue humalog (just before each meal) 15-5-20 units, and continue nph, 50 units at bedtime.  If you eat a snack at bedtime, take 5 units of humalog with it.   check your blood sugar 2 times a day.  vary the time of day when you check, between before the 3 meals, and at bedtime.  also check if you have symptoms of your blood sugar being too high or too low.  please keep a record of the readings and bring it to your next appointment here.  please call us sooner if you are having low blood sugar episodes.   Please make a follow-up appointment in 3 months.

## 2012-02-02 NOTE — Progress Notes (Signed)
Subjective:    Patient ID: Donna Howe, female    DOB: [birth year], 56 y.o.   MRN: [id number]  HPI Pt trurns for f/u of insulin-requiring DM (dx'ed 123XX123; complicated by renal failure and CAD).  She ran out of her NPH pen last week.  no cbg record, but states cbg's are otherwise well-controlled.  pt states she feels well in general. Past Medical History  Diagnosis Date  . Hypercholesteremia   . Hypertension   . Diabetes mellitus   . DIABETES MELLITUS, TYPE II 03/31/2007  . HYPERLIPIDEMIA 03/31/2007  . Overweight 08/26/2009  . HYPERTENSION 03/31/2007  . CORONARY ARTERY DISEASE 03/31/2007  . ASTHMA 08/26/2009  . RENAL FAILURE 03/31/2007  . Gout   . S/p small bowel obstruction 01/07/2010    Past Surgical History  Procedure Date  . Cardiac surgery   . Cholecystectomy   . Hernia repair   . Cesarean section   . Cardiac catheterization 07/18/2004  . Coronary artery bypass graft 2005    History   Social History  . Marital Status: Divorced    Spouse Name: N/A    Number of Children: N/A  . Years of Education: N/A   Occupational History  . Disabled    Social History Main Topics  . Smoking status: Current Everyday Smoker  . Smokeless tobacco: Not on file  . Alcohol Use: No  . Drug Use: No  . Sexually Active:    Other Topics Concern  . Not on file   Social History Narrative  . No narrative on file    Current Outpatient Prescriptions on File Prior to Visit  Medication Sig Dispense Refill  . aspirin 325 MG tablet Take 325 mg by mouth daily.        Marland Kitchen atorvastatin (LIPITOR) 80 MG tablet Take 80 mg by mouth daily.        . clopidogrel (PLAVIX) 75 MG tablet Take 75 mg by mouth daily.        . colchicine 0.6 MG tablet Take 0.6 mg by mouth as needed.       . furosemide (LASIX) 40 MG tablet Take 40 mg by mouth daily.        . insulin lispro (HUMALOG) 100 UNIT/ML injection Inject into the skin 3 (three) times daily before meals. 17 units at breakfast, 7 units with lunch, 22 units  with supper      . Insulin Pen Needle 31G X 8 MM MISC Use as directed three times a day       . isosorbide dinitrate (ISORDIL) 20 MG tablet 3 tablets by mouth two times daily       . metoprolol tartrate (LOPRESSOR) 25 MG tablet Take 25 mg by mouth 2 (two) times daily.        . nitroGLYCERIN (NITROSTAT) 0.4 MG SL tablet Place 0.4 mg under the tongue every 5 (five) minutes as needed. Not to exceed 3 in 15 minute time frame       . quinapril (ACCUPRIL) 5 MG tablet Take 5 mg by mouth daily.          Allergies  Allergen Reactions  . Amoxicillin     REACTION: unspecified  . Penicillins     REACTION: Unknown    Family History  Problem Relation Age of Onset  . Diabetes Mother   . Heart disease Mother   . Hyperlipidemia Mother   . Thyroid disease Father     BP 138/84  Pulse 71  Temp(Src) 98.9 F (37.2 C) (  Oral)  Ht 5\' 6"  (1.676 m)  Wt 220 lb (99.791 kg)  BMI 35.51 kg/m2  SpO2 96%    Review of Systems denies hypoglycemia.      Objective:   Physical Exam VITAL SIGNS:  See vs page GENERAL: no distress SKIN:  Insulin injection sites at the anterior abdomen are normal  Lab Results  Component Value Date   HGBA1C 9.0* 02/02/2012      Assessment & Plan:  DM.  needs increased rx

## 2012-02-05 ENCOUNTER — Telehealth: Payer: Self-pay | Admitting: *Deleted

## 2012-02-05 NOTE — Telephone Encounter (Signed)
Called pt to inform of lab results, left message for pt to callback office (letter also mailed to pt). 

## 2012-02-06 NOTE — Telephone Encounter (Signed)
Left message for pt to callback office.  

## 2012-02-06 NOTE — Telephone Encounter (Signed)
Pt informed of lab results. 

## 2012-02-20 ENCOUNTER — Other Ambulatory Visit: Payer: Self-pay

## 2012-02-20 ENCOUNTER — Encounter (HOSPITAL_COMMUNITY): Payer: Self-pay | Admitting: Emergency Medicine

## 2012-02-20 ENCOUNTER — Emergency Department (HOSPITAL_COMMUNITY): Payer: Medicare Other

## 2012-02-20 ENCOUNTER — Emergency Department (HOSPITAL_COMMUNITY)
Admission: EM | Admit: 2012-02-20 | Discharge: 2012-02-21 | Disposition: A | Discharge status: Home / Self Care | Payer: Medicare Other | Attending: Emergency Medicine | Admitting: Emergency Medicine

## 2012-02-20 DIAGNOSIS — Z79899 Other long term (current) drug therapy: Secondary | ICD-10-CM | POA: Insufficient documentation

## 2012-02-20 DIAGNOSIS — M109 Gout, unspecified: Secondary | ICD-10-CM | POA: Insufficient documentation

## 2012-02-20 DIAGNOSIS — Z794 Long term (current) use of insulin: Secondary | ICD-10-CM | POA: Insufficient documentation

## 2012-02-20 DIAGNOSIS — E7800 Pure hypercholesterolemia, unspecified: Secondary | ICD-10-CM | POA: Insufficient documentation

## 2012-02-20 DIAGNOSIS — F17200 Nicotine dependence, unspecified, uncomplicated: Secondary | ICD-10-CM | POA: Insufficient documentation

## 2012-02-20 DIAGNOSIS — I10 Essential (primary) hypertension: Secondary | ICD-10-CM | POA: Insufficient documentation

## 2012-02-20 DIAGNOSIS — E119 Type 2 diabetes mellitus without complications: Secondary | ICD-10-CM | POA: Insufficient documentation

## 2012-02-20 DIAGNOSIS — I2510 Atherosclerotic heart disease of native coronary artery without angina pectoris: Secondary | ICD-10-CM | POA: Insufficient documentation

## 2012-02-20 DIAGNOSIS — N19 Unspecified kidney failure: Secondary | ICD-10-CM | POA: Insufficient documentation

## 2012-02-20 DIAGNOSIS — R079 Chest pain, unspecified: Secondary | ICD-10-CM

## 2012-02-20 DIAGNOSIS — J45909 Unspecified asthma, uncomplicated: Secondary | ICD-10-CM | POA: Insufficient documentation

## 2012-02-20 DIAGNOSIS — Z7982 Long term (current) use of aspirin: Secondary | ICD-10-CM | POA: Insufficient documentation

## 2012-02-20 NOTE — ED Notes (Signed)
Patient presents to ER via [facility] with c/o substernal chest pain that radiates into left arm and into back.  Patient also c/o headache that started at same time as chest pain.

## 2012-02-21 LAB — CBC
Hemoglobin: 13 g/dL (ref 12.0–15.0)
MCH: 30.2 pg (ref 26.0–34.0)
MCV: 86 fL (ref 78.0–100.0)
Platelets: 157 10*3/uL (ref 150–400)
RBC: 4.3 MIL/uL (ref 3.87–5.11)
WBC: 9.5 10*3/uL (ref 4.0–10.5)

## 2012-02-21 LAB — TROPONIN I: Troponin I: <0.3 ng/mL (ref <0.30)

## 2012-02-21 LAB — BASIC METABOLIC PANEL
CO2: 25 mEq/L (ref 19–32)
Calcium: 9.5 mg/dL (ref 8.4–10.5)
Chloride: 103 mEq/L (ref 96–112)
Glucose, Bld: 167 mg/dL — ABNORMAL HIGH (ref 70–99)
Sodium: 138 mEq/L (ref 135–145)

## 2012-02-21 MED ORDER — ONDANSETRON HCL 4 MG/2ML IJ SOLN
4.0000 mg | Freq: Once | INTRAMUSCULAR | Status: AC
  Administered 2012-02-21: 4 mg via INTRAVENOUS
  Filled 2012-02-21: qty 2

## 2012-02-21 MED ORDER — MORPHINE SULFATE 2 MG/ML IJ SOLN
2.0000 mg | Freq: Once | INTRAMUSCULAR | Status: AC
  Administered 2012-02-21: 2 mg via INTRAVENOUS
  Filled 2012-02-21: qty 1

## 2012-02-21 NOTE — ED Notes (Signed)
Patient lying in bed with eyes closed.  Respirations even and unlabored; equal rise and fall of chest noted.

## 2012-02-21 NOTE — ED Notes (Signed)
Patient was given Aspirin 324 mg po and NTG x 2 SL by EMS.

## 2012-02-21 NOTE — ED Provider Notes (Signed)
History     CSN: [id number]  Arrival date & time 02/20/12  2343   First MD Initiated Contact with Patient 02/21/12 0006      Chief Complaint  Patient presents with  . Chest Pain    (Consider location/radiation/quality/duration/timing/severity/associated sxs/prior treatment) HPI   Donna Howe is a 56 y.o. female with a h/o CABG, CRI, DM, HTN, hyperlipidemia  brought in by ambulance, who presents to the Emergency Department complaining of chest pain that began at 10:30 PM while sitting in a chair. Pain was on the left side of her chest and radiated to her left arm and through to her back. There was no associated sweating, nausea, or shortness of breath. She was given 4 baby aspirin and 2 SL NTG in the ambulance with relief of most of the pain. It is currently a dull ache on the left side of her chest.   PCP Dr. Nadara Mustard   Past Medical History  Diagnosis Date  . Hypercholesteremia   . Hypertension   . Diabetes mellitus   . DIABETES MELLITUS, TYPE II 03/31/2007  . HYPERLIPIDEMIA 03/31/2007  . Overweight 08/26/2009  . HYPERTENSION 03/31/2007  . CORONARY ARTERY DISEASE 03/31/2007  . ASTHMA 08/26/2009  . RENAL FAILURE 03/31/2007  . Gout   . S/p small bowel obstruction 01/07/2010    Past Surgical History  Procedure Date  . Cardiac surgery   . Cholecystectomy   . Hernia repair   . Cesarean section   . Cardiac catheterization 07/18/2004  . Coronary artery bypass graft 2005    Family History  Problem Relation Age of Onset  . Diabetes Mother   . Heart disease Mother   . Hyperlipidemia Mother   . Thyroid disease Father     History  Substance Use Topics  . Smoking status: Current Everyday Smoker  . Smokeless tobacco: Not on file  . Alcohol Use: No    OB History    Grav Para Term Preterm Abortions TAB SAB Ect Mult Living                  Review of Systems  Constitutional: Negative for fever.       10 Systems reviewed and are negative for acute change except as noted  in the HPI.  HENT: Negative for congestion.   Eyes: Negative for discharge and redness.  Respiratory: Negative for cough and shortness of breath.   Cardiovascular: Positive for chest pain.  Gastrointestinal: Negative for vomiting and abdominal pain.  Musculoskeletal: Negative for back pain.  Skin: Negative for rash.  Neurological: Negative for syncope, numbness and headaches.  Psychiatric/Behavioral:       No behavior change.    Allergies  Amoxicillin and Penicillins  Home Medications   Current Outpatient Rx  Name Route Sig Dispense Refill  . ASPIRIN 325 MG PO TABS Oral Take 325 mg by mouth daily.      . ATORVASTATIN CALCIUM 80 MG PO TABS Oral Take 80 mg by mouth daily.      Marland Kitchen CLOPIDOGREL BISULFATE 75 MG PO TABS Oral Take 75 mg by mouth daily.      . COLCHICINE 0.6 MG PO TABS Oral Take 0.6 mg by mouth as needed.     . FUROSEMIDE 40 MG PO TABS Oral Take 40 mg by mouth daily.      . INSULIN LISPRO (HUMAN) 100 UNIT/ML [place] SOLN Subcutaneous Inject into the skin 3 (three) times daily before meals. 17 units at breakfast, 7 units with lunch, 22  units with supper    . INSULIN ISOPHANE HUMAN 100 UNIT/ML [place] SUSP Subcutaneous Inject 55 Units into the skin at bedtime. 30 mL 12  . INSULIN PEN NEEDLE 31G X 8 MM MISC  Use as directed three times a day     . ISOSORBIDE DINITRATE 20 MG PO TABS  3 tablets by mouth two times daily     . METOPROLOL TARTRATE 25 MG PO TABS Oral Take 25 mg by mouth 2 (two) times daily.      Marland Kitchen NITROGLYCERIN 0.4 MG SL SUBL Sublingual Place 0.4 mg under the tongue every 5 (five) minutes as needed. Not to exceed 3 in 15 minute time frame     . QUINAPRIL HCL 5 MG PO TABS Oral Take 5 mg by mouth daily.        BP 111/88  Pulse 70  Temp(Src) 98.3 F (36.8 C) (Oral)  Resp 20  SpO2 97%  Physical Exam  Nursing note and vitals reviewed. Constitutional: She appears well-developed and well-nourished. No distress.       Awake, alert, nontoxic appearance.  HENT:  Head:  Atraumatic.  Eyes: Conjunctivae and EOM are normal. Pupils are equal, round, and reactive to light. Right eye exhibits no discharge. Left eye exhibits no discharge.  Neck: Normal range of motion. Neck supple. No JVD present.  Cardiovascular: Normal rate, regular rhythm, normal heart sounds and intact distal pulses.   Pulmonary/Chest: Effort normal. She exhibits no tenderness.  Abdominal: Soft. There is no tenderness. There is no rebound.  Musculoskeletal: She exhibits no tenderness.       Baseline ROM, no obvious new focal weakness.  Neurological:       Mental status and motor strength appears baseline for patient and situation.  Skin: No rash noted.  Psychiatric: She has a normal mood and affect.    ED Course  Procedures (including critical care time) Results for orders placed during the hospital encounter of 02/20/12  CBC      Component Value Range   WBC 9.5  4.0 - 10.5 (K/uL)   RBC 4.30  3.87 - 5.11 (MIL/uL)   Hemoglobin 13.0  12.0 - 15.0 (g/dL)   HCT 37.0  36.0 - 46.0 (%)   MCV 86.0  78.0 - 100.0 (fL)   MCH 30.2  26.0 - 34.0 (pg)   MCHC 35.1  30.0 - 36.0 (g/dL)   RDW 12.8  11.5 - 15.5 (%)   Platelets 157  150 - 400 (K/uL)  BASIC METABOLIC PANEL      Component Value Range   Sodium 138  135 - 145 (mEq/L)   Potassium 3.9  3.5 - 5.1 (mEq/L)   Chloride 103  96 - 112 (mEq/L)   CO2 25  19 - 32 (mEq/L)   Glucose, Bld 167 (*) 70 - 99 (mg/dL)   BUN 26 (*) 6 - 23 (mg/dL)   Creatinine, Ser 1.13 (*) 0.50 - 1.10 (mg/dL)   Calcium 9.5  8.4 - 10.5 (mg/dL)   GFR calc non Af Amer 54 (*) >90 (mL/min)   GFR calc Af Amer 63 (*) >90 (mL/min)  TROPONIN I      Component Value Range   Troponin I <0.30  <0.30 (ng/mL)    Chest Portable 1 View  02/21/2012  *RADIOLOGY REPORT*  Clinical Data: Chest pain.  PORTABLE CHEST - 1 VIEW  Comparison: 01/03/2010.  Findings: The heart is normal in size and stable.  The mediastinal and hilar contours are normal.  Stable surgical changes  from bypass surgery.   The lungs are clear.  No pleural effusion.  The bony thorax is intact.  IMPRESSION: Normal chest x-ray.  Original Report Authenticated By: P. Kalman Jewels, M.D.    Date: 02/21/2012  2346  Rate: 75  Rhythm: normal sinus rhythm  QRS Axis: right  Intervals: normal  ST/T Wave abnormalities: nonspecific ST/T changes  Conduction Disutrbances:none  Narrative Interpretation:   Old EKG Reviewed: unchanged c/w 01/08/2010      MDM  Patient with chest pain at home. EKG unremarkable, chest xray without acute findings and troponin negative. Patient has remained pain free since arrival. Dx testing d/w pt and family.  Questions answered.  Verb understanding, agreeable to d/c home with outpt f/u.  Pt feels improved after observation and/or treatment in ED.Pt stable in ED with no significant deterioration in condition.The patient appears reasonably screened and/or stabilized for discharge and I doubt any other medical condition or other [facility] requiring further screening, evaluation, or treatment in the ED at this time prior to discharge.  MDM Reviewed: nursing note Reviewed previous: labs and ECG Interpretation: ECG, labs and x-ray          [employer]. Olin Hauser, MD 02/21/12 ID:[id number]

## 2012-02-21 NOTE — Discharge Instructions (Signed)
Your lab work here tonight including your heart numbers was normal. Your EKG and chest xray were normal. Continue your home medicines. Follow up with your doctor.

## 2012-05-10 ENCOUNTER — Ambulatory Visit: Payer: Medicare Other | Admitting: Endocrinology

## 2012-06-11 ENCOUNTER — Ambulatory Visit (INDEPENDENT_AMBULATORY_CARE_PROVIDER_SITE_OTHER): Payer: Medicare Other | Admitting: Endocrinology

## 2012-06-11 ENCOUNTER — Encounter: Payer: Self-pay | Admitting: Endocrinology

## 2012-06-11 VITALS — BP 120/72 | HR 73 | Temp 98.3°F | Wt 221.0 lb

## 2012-06-11 DIAGNOSIS — E119 Type 2 diabetes mellitus without complications: Secondary | ICD-10-CM

## 2012-06-11 MED ORDER — INSULIN DETEMIR 100 UNIT/ML ~~LOC~~ SOLN: 90.0000 [IU] | SUBCUTANEOUS | Status: DC

## 2012-06-11 MED ORDER — GLUCOSE BLOOD VI STRP: 1.0000 | ORAL_STRIP | Freq: Two times a day (BID) | Status: DC

## 2012-06-11 NOTE — Patient Instructions (Addendum)
Please change both insulins to levemir, 90 units each morning.  check your blood sugar 2 times a day.  vary the time of day when you check, between before the 3 meals, and at bedtime.  also check if you have symptoms of your blood sugar being too high or too low.  please keep a record of the readings and bring it to your next appointment here.  please call us sooner if you are having low blood sugar episodes.   Please make a follow-up appointment in 1 month.

## 2012-06-11 NOTE — Progress Notes (Signed)
Subjective:    Patient ID: Donna Howe, female    DOB: March 30, 1956, 56 y.o.   MRN: [id number]  HPI Pt trurns for f/u of insulin-requiring DM (dx'ed 123XX123; complicated by renal failure and CAD).  She ran out of her NPH pen last week.  no cbg record, but states cbg's are otherwise well-controlled.  pt states she feels well in general.  She inconsistently takes her insulin.   Past Medical History  Diagnosis Date  . Hypercholesteremia   . Hypertension   . Diabetes mellitus   . DIABETES MELLITUS, TYPE II 03/31/2007  . HYPERLIPIDEMIA 03/31/2007  . Overweight 08/26/2009  . HYPERTENSION 03/31/2007  . CORONARY ARTERY DISEASE 03/31/2007  . ASTHMA 08/26/2009  . RENAL FAILURE 03/31/2007  . Gout   . S/p small bowel obstruction 01/07/2010    Past Surgical History  Procedure Date  . Cardiac surgery   . Cholecystectomy   . Hernia repair   . Cesarean section   . Cardiac catheterization 07/18/2004  . Coronary artery bypass graft 2005    History   Social History  . Marital Status: Divorced    Spouse Name: N/A    Number of Children: N/A  . Years of Education: N/A   Occupational History  . Disabled    Social History Main Topics  . Smoking status: Current Every Day Smoker  . Smokeless tobacco: Not on file  . Alcohol Use: No  . Drug Use: No  . Sexually Active:    Other Topics Concern  . Not on file   Social History Narrative  . No narrative on file    Current Outpatient Prescriptions on File Prior to Visit  Medication Sig Dispense Refill  . aspirin 325 MG tablet Take 325 mg by mouth daily.        Marland Kitchen atorvastatin (LIPITOR) 80 MG tablet Take 80 mg by mouth daily.        . clopidogrel (PLAVIX) 75 MG tablet Take 75 mg by mouth daily.        . colchicine 0.6 MG tablet Take 0.6 mg by mouth as needed.       . furosemide (LASIX) 40 MG tablet Take 40 mg by mouth daily.        . Insulin Pen Needle 31G X 8 MM MISC Use as directed three times a day       . isosorbide dinitrate (ISORDIL) 20  MG tablet 3 tablets by mouth two times daily       . metoprolol tartrate (LOPRESSOR) 25 MG tablet Take 25 mg by mouth 2 (two) times daily.        . nitroGLYCERIN (NITROSTAT) 0.4 MG SL tablet Place 0.4 mg under the tongue every 5 (five) minutes as needed. Not to exceed 3 in 15 minute time frame       . quinapril (ACCUPRIL) 5 MG tablet Take 5 mg by mouth daily.        . insulin detemir (LEVEMIR FLEXPEN) 100 UNIT/ML injection Inject 90 Units into the skin every morning. And pen needles 1/day  30 mL  12    Allergies  Allergen Reactions  . Amoxicillin     REACTION: unspecified  . Penicillins     REACTION: Unknown    Family History  Problem Relation Age of Onset  . Diabetes Mother   . Heart disease Mother   . Hyperlipidemia Mother   . Thyroid disease Father     BP 120/72  Pulse 73  Temp 98.3 F (36.8  C) (Oral)  Wt 221 lb (100.245 kg)  Review of Systems denies hypoglycemia    Objective:   Physical Exam VITAL SIGNS:  See vs page GENERAL: no distress Pulses: dorsalis pedis intact bilat.   Feet: no deformity.  no ulcer on the feet.  feet are of normal color and temp.  no edema Neuro: sensation is intact to touch on the feet     Assessment & Plan:  DM, therapy limited by noncompliance.  i'll do the best i can.  She may do better with a simpler regimen.

## 2012-07-11 ENCOUNTER — Ambulatory Visit (INDEPENDENT_AMBULATORY_CARE_PROVIDER_SITE_OTHER): Payer: Medicare Other | Admitting: Endocrinology

## 2012-07-11 ENCOUNTER — Encounter: Payer: Self-pay | Admitting: Endocrinology

## 2012-07-11 VITALS — BP 132/80 | HR 66 | Temp 97.9°F | Resp 15 | Wt 222.1 lb

## 2012-07-11 DIAGNOSIS — E119 Type 2 diabetes mellitus without complications: Secondary | ICD-10-CM

## 2012-07-11 NOTE — Progress Notes (Signed)
Subjective:    Patient ID: Donna Howe, female    DOB: 01-Jun-1956, 56 y.o.   MRN: [id number]  HPI Pt trurns for f/u of insulin-requiring DM (dx'ed 123XX123; complicated by renal failure and CAD; compliance has been much better with a simpler qd insulin schedule).  she brings a record of her cbg's which i have reviewed today.  It varies from 81-230, but most are in the low to mid-100's.    Past Medical History  Diagnosis Date  . Hypercholesteremia   . Hypertension   . Diabetes mellitus   . DIABETES MELLITUS, TYPE II 03/31/2007  . HYPERLIPIDEMIA 03/31/2007  . Overweight 08/26/2009  . HYPERTENSION 03/31/2007  . CORONARY ARTERY DISEASE 03/31/2007  . ASTHMA 08/26/2009  . RENAL FAILURE 03/31/2007  . Gout   . S/p small bowel obstruction 01/07/2010    Past Surgical History  Procedure Date  . Cardiac surgery   . Cholecystectomy   . Hernia repair   . Cesarean section   . Cardiac catheterization 07/18/2004  . Coronary artery bypass graft 2005    History   Social History  . Marital Status: Divorced    Spouse Name: N/A    Number of Children: N/A  . Years of Education: N/A   Occupational History  . Disabled    Social History Main Topics  . Smoking status: Current Every Day Smoker  . Smokeless tobacco: Not on file  . Alcohol Use: No  . Drug Use: No  . Sexually Active:    Other Topics Concern  . Not on file   Social History Narrative  . No narrative on file    Current Outpatient Prescriptions on File Prior to Visit  Medication Sig Dispense Refill  . aspirin 325 MG tablet Take 325 mg by mouth daily.        Marland Kitchen atorvastatin (LIPITOR) 80 MG tablet Take 80 mg by mouth daily.        . clopidogrel (PLAVIX) 75 MG tablet Take 75 mg by mouth daily.        . colchicine 0.6 MG tablet Take 0.6 mg by mouth as needed.       . furosemide (LASIX) 40 MG tablet Take 40 mg by mouth daily.        Marland Kitchen glucose blood (ONE TOUCH ULTRA TEST) test strip 1 each by Other route 2 (two) times daily. And  lancets 2/day 250.43  100 each  12  . insulin detemir (LEVEMIR FLEXPEN) 100 UNIT/ML injection Inject 90 Units into the skin every morning. And pen needles 1/day  30 mL  12  . Insulin Pen Needle 31G X 8 MM MISC Use as directed three times a day       . isosorbide dinitrate (ISORDIL) 20 MG tablet 3 tablets by mouth two times daily       . metoprolol tartrate (LOPRESSOR) 25 MG tablet Take 25 mg by mouth 2 (two) times daily.        . nitroGLYCERIN (NITROSTAT) 0.4 MG SL tablet Place 0.4 mg under the tongue every 5 (five) minutes as needed. Not to exceed 3 in 15 minute time frame       . quinapril (ACCUPRIL) 5 MG tablet Take 5 mg by mouth daily.          Allergies  Allergen Reactions  . Amoxicillin     REACTION: unspecified  . Penicillins     REACTION: Unknown    Family History  Problem Relation Age of Onset  . Diabetes  Mother   . Heart disease Mother   . Hyperlipidemia Mother   . Thyroid disease Father     BP 132/80  Pulse 66  Temp 97.9 F (36.6 C) (Oral)  Resp 15  Wt 222 lb 2 oz (100.755 kg)  SpO2 98%  Review of Systems denies hypoglycemia    Objective:   Physical Exam VITAL SIGNS:  See vs page GENERAL: no distress NECK: There is no palpable thyroid enlargement.  No thyroid nodule is palpable.  No palpable lymphadenopathy at the anterior neck.       Assessment & Plan:  DM, with much better control on a simpler regimen

## 2012-07-11 NOTE — Patient Instructions (Addendum)
Please continue levemir, 90 units each morning.  check your blood sugar 2 times a day.  vary the time of day when you check, between before the 3 meals, and at bedtime.  also check if you have symptoms of your blood sugar being too high or too low.  please keep a record of the readings and bring it to your next appointment here.  please call us sooner if you are having low blood sugar episodes.   Please make a follow-up appointment in 3 months.

## 2012-10-14 ENCOUNTER — Ambulatory Visit: Payer: Medicare Other | Admitting: Endocrinology

## 2012-10-24 ENCOUNTER — Encounter: Payer: Self-pay | Admitting: Endocrinology

## 2012-10-24 ENCOUNTER — Ambulatory Visit (INDEPENDENT_AMBULATORY_CARE_PROVIDER_SITE_OTHER): Payer: Medicare Other | Admitting: Endocrinology

## 2012-10-24 VITALS — BP 128/78 | HR 76 | Wt 224.0 lb

## 2012-10-24 DIAGNOSIS — E119 Type 2 diabetes mellitus without complications: Secondary | ICD-10-CM

## 2012-10-24 LAB — HEMOGLOBIN A1C: Hgb A1c MFr Bld: 8.5 % — ABNORMAL HIGH (ref 4.6–6.5)

## 2012-10-24 NOTE — Patient Instructions (Addendum)
Please continue levemir, 90 units each morning.  check your blood sugar 2 times a day.  vary the time of day when you check, between before the 3 meals, and at bedtime.  also check if you have symptoms of your blood sugar being too high or too low.  please keep a record of the readings and bring it to your next appointment here.  please call us sooner if you are having low blood sugar episodes.   Please make a follow-up appointment in 3 months. blood tests are being requested for you today.  We'll contact you with results. good diet and exercise habits significanly improve the control of your diabetes.  please let me know if you wish to be referred to a dietician.  high blood sugar is very risky to your health.  you should see an eye doctor every year.  controlling your blood pressure and cholesterol drastically reduces the damage diabetes does to your body.  this also applies to quitting smoking.  please discuss these with your doctor.  you should take an aspirin every day, unless you have been advised by a doctor not to.

## 2012-10-24 NOTE — Progress Notes (Signed)
Subjective:    Patient ID: Donna Howe, female    DOB: 03/21/1956, 57 y.o.   MRN: [id number]  HPI Pt returns for f/u of insulin-requiring DM (dx'ed 123XX123; complicated by renal failure and CAD; compliance has been much better with a simpler qd insulin schedule).  no cbg record, but states cbg's are well-controlled.  She has mild hypoglycemia when a meal is delayed.  Past Medical History  Diagnosis Date  . Hypercholesteremia   . Hypertension   . Diabetes mellitus   . DIABETES MELLITUS, TYPE II 03/31/2007  . HYPERLIPIDEMIA 03/31/2007  . Overweight 08/26/2009  . HYPERTENSION 03/31/2007  . CORONARY ARTERY DISEASE 03/31/2007  . ASTHMA 08/26/2009  . RENAL FAILURE 03/31/2007  . Gout   . S/p small bowel obstruction 01/07/2010    Past Surgical History  Procedure Date  . Cardiac surgery   . Cholecystectomy   . Hernia repair   . Cesarean section   . Cardiac catheterization 07/18/2004  . Coronary artery bypass graft 2005    History   Social History  . Marital Status: Divorced    Spouse Name: N/A    Number of Children: N/A  . Years of Education: N/A   Occupational History  . Disabled    Social History Main Topics  . Smoking status: Current Every Day Smoker  . Smokeless tobacco: Not on file  . Alcohol Use: No  . Drug Use: No  . Sexually Active:    Other Topics Concern  . Not on file   Social History Narrative  . No narrative on file    Current Outpatient Prescriptions on File Prior to Visit  Medication Sig Dispense Refill  . aspirin 325 MG tablet Take 325 mg by mouth daily.        Marland Kitchen atorvastatin (LIPITOR) 80 MG tablet Take 80 mg by mouth daily.        . clopidogrel (PLAVIX) 75 MG tablet Take 75 mg by mouth daily.        . colchicine 0.6 MG tablet Take 0.6 mg by mouth as needed.       . furosemide (LASIX) 40 MG tablet Take 40 mg by mouth daily.        Marland Kitchen glucose blood (ONE TOUCH ULTRA TEST) test strip 1 each by Other route 2 (two) times daily. And lancets 2/day 250.43  100  each  12  . insulin detemir (LEVEMIR) 100 UNIT/ML injection Inject 100 Units into the skin every morning. And pen needles 1/day      . Insulin Pen Needle 31G X 8 MM MISC Use as directed three times a day       . isosorbide dinitrate (ISORDIL) 20 MG tablet 3 tablets by mouth two times daily       . metoprolol tartrate (LOPRESSOR) 25 MG tablet Take 25 mg by mouth 2 (two) times daily.        . nitroGLYCERIN (NITROSTAT) 0.4 MG SL tablet Place 0.4 mg under the tongue every 5 (five) minutes as needed. Not to exceed 3 in 15 minute time frame       . ONETOUCH DELICA LANCETS MISC       . quinapril (ACCUPRIL) 5 MG tablet Take 5 mg by mouth daily.          Allergies  Allergen Reactions  . Amoxicillin     REACTION: unspecified  . Penicillins     REACTION: Unknown    Family History  Problem Relation Age of Onset  . Diabetes  Mother   . Heart disease Mother   . Hyperlipidemia Mother   . Thyroid disease Father     BP 128/78  Pulse 76  Wt 224 lb (101.606 kg)  SpO2 96%  Review of Systems Denies LOC    Objective:   Physical Exam VITAL SIGNS:  See vs page.   GENERAL: no distress. Pulses: dorsalis pedis intact bilat.   Feet: no deformity.  no ulcer on the feet.  feet are of normal color and temp.  no edema Neuro: sensation is intact to touch on the feet.   Lab Results  Component Value Date   HGBA1C 8.5* 10/24/2012      Assessment & Plan:  DM: therapy limited by pt's need for a simple regimen.  She needs increased rx

## 2012-11-02 ENCOUNTER — Other Ambulatory Visit: Payer: Self-pay

## 2012-12-09 ENCOUNTER — Ambulatory Visit (INDEPENDENT_AMBULATORY_CARE_PROVIDER_SITE_OTHER): Payer: Medicare Other | Admitting: Surgery

## 2012-12-09 ENCOUNTER — Encounter (INDEPENDENT_AMBULATORY_CARE_PROVIDER_SITE_OTHER): Payer: Self-pay | Admitting: Surgery

## 2012-12-09 VITALS — BP 168/84 | HR 82 | Resp 18 | Ht 66.0 in | Wt 223.0 lb

## 2012-12-09 DIAGNOSIS — M109 Gout, unspecified: Secondary | ICD-10-CM

## 2012-12-09 NOTE — Progress Notes (Signed)
Subjective:     Patient ID: Donna Howe, female   DOB: 1955-10-28, 57 y.o.   MRN: [id number]  HPI  Donna Howe  06-04-1956 [id number]  Patient Care Team: Rory Percy, MD as PCP - General (Family Medicine) Carlena Bjornstad, MD as Consulting Physician (Cardiology)  This patient is a 57 y.o.female who presents today for surgical evaluation at the request of Dr. Nadara Mustard.   Reason for visit: Recurrent abdominal wall hernia  Pleasant morbidly obese female.  History coronary disease status post bypass surgery in 2005.  Required followup stenting a few years later.  On chronic Plavix.  She continues to smoke.  Diabetic on oral hypoglycemic & insulin medications.  Had a C-section.  Helped hernia.  Had an open repair in with mesh in 1989.  Developed recurrence.  Had a laparoscopic repair with a giant sheet of mesh placed.  Has developed increased pain and swelling about a year ago.  It has gotten larger.  Told she has a hernia.  She was sent by her primary care physician to see if surgery is an option.  Recent gout flair.  Difficulty walking.  Has not filled her prescription yet.  Claims to have a bowel movement every day.  Has never had a colonoscopy.  Patient Active Problem List  Diagnosis  . DIABETES MELLITUS, TYPE II  . HYPERLIPIDEMIA  . Obesity (BMI 30-39.9)  . TOBACCO ABUSE  . HYPERTENSION  . CORONARY ARTERY DISEASE  . ASTHMA  . RENAL FAILURE  . PROTEINURIA, MILD  . CORONARY ARTERY BYPASS GRAFT, HX OF    Past Medical History  Diagnosis Date  . Hypercholesteremia   . Hypertension   . Diabetes mellitus   . DIABETES MELLITUS, TYPE II 03/31/2007  . HYPERLIPIDEMIA 03/31/2007  . Overweight 08/26/2009  . HYPERTENSION 03/31/2007  . CORONARY ARTERY DISEASE 03/31/2007  . ASTHMA 08/26/2009  . RENAL FAILURE 03/31/2007  . Gout   . S/p small bowel obstruction 01/07/2010    Past Surgical History  Procedure Laterality Date  . Cardiac surgery    . Cholecystectomy  2010  . Hernia repair   1989  . Cesarean section  1984  . Cardiac catheterization  07/18/2004  . Coronary artery bypass graft  2005  . Cardiac stents  2006-2007  . Incisional hernia x2  03/04/2010     laparoscopic by Dr Ronnald Collum  . Shoulder surgery      History   Social History  . Marital Status: Divorced    Spouse Name: N/A    Number of Children: N/A  . Years of Education: N/A   Occupational History  . Disabled    Social History Main Topics  . Smoking status: Current Every Day Smoker  . Smokeless tobacco: Not on file  . Alcohol Use: No  . Drug Use: No  . Sexually Active: Not on file   Other Topics Concern  . Not on file   Social History Narrative  . No narrative on file    Family History  Problem Relation Age of Onset  . Diabetes Mother   . Heart disease Mother   . Hyperlipidemia Mother   . Thyroid disease Father     Current Outpatient Prescriptions  Medication Sig Dispense Refill  . aspirin 325 MG tablet Take 325 mg by mouth daily.        Marland Kitchen atorvastatin (LIPITOR) 80 MG tablet Take 80 mg by mouth daily.        . clopidogrel (PLAVIX) 75 MG tablet  Take 75 mg by mouth daily.        . colchicine 0.6 MG tablet Take 0.6 mg by mouth as needed.       . furosemide (LASIX) 40 MG tablet Take 40 mg by mouth daily.        Marland Kitchen glucose blood (ONE TOUCH ULTRA TEST) test strip 1 each by Other route 2 (two) times daily. And lancets 2/day 250.43  100 each  12  . insulin detemir (LEVEMIR) 100 UNIT/ML injection Inject 100 Units into the skin every morning. And pen needles 1/day      . Insulin Pen Needle 31G X 8 MM MISC Use as directed three times a day       . isosorbide dinitrate (ISORDIL) 20 MG tablet 3 tablets by mouth two times daily       . metoprolol tartrate (LOPRESSOR) 25 MG tablet Take 25 mg by mouth 2 (two) times daily.        . nitroGLYCERIN (NITROSTAT) 0.4 MG SL tablet Place 0.4 mg under the tongue every 5 (five) minutes as needed. Not to exceed 3 in 15 minute time frame       . ONETOUCH  DELICA LANCETS MISC       . quinapril (ACCUPRIL) 5 MG tablet Take 5 mg by mouth daily.         No current facility-administered medications for this visit.     Allergies  Allergen Reactions  . Amoxicillin     REACTION: unspecified  . Penicillins     REACTION: Unknown    BP 168/84  Pulse 82  Resp 18  Ht 5\' 6"  (1.676 m)  Wt 223 lb (101.152 kg)  BMI 36.01 kg/m2  No results found.   Review of Systems  Constitutional: Negative for fever, chills, diaphoresis, appetite change and fatigue.  HENT: Negative for ear pain, sore throat, trouble swallowing, neck pain and ear discharge.   Eyes: Negative for photophobia, discharge and visual disturbance.  Respiratory: Positive for shortness of breath. Negative for cough, choking and chest tightness.   Cardiovascular: Negative for chest pain and palpitations.       Patient walks 20 minutes for about 1/2 mile without difficulty.  No exertional chest/neck/shoulder/arm pain.   Gastrointestinal: Positive for nausea, abdominal pain and abdominal distention. Negative for vomiting, diarrhea, constipation, anal bleeding and rectal pain.  Genitourinary: Negative for dysuria, frequency and difficulty urinating.  Musculoskeletal: Positive for myalgias, joint swelling and arthralgias.  Skin: Negative for color change, pallor and rash.  Neurological: Negative for dizziness, speech difficulty, weakness and numbness.  Hematological: Negative for adenopathy.  Psychiatric/Behavioral: Negative for confusion and agitation. The patient is not nervous/anxious.        Objective:   Physical Exam  Constitutional: She is oriented to person, place, and time. She appears well-developed and well-nourished. No distress.  HENT:  Head: Normocephalic.  Mouth/Throat: Oropharynx is clear and moist. No oropharyngeal exudate.  Eyes: Conjunctivae and EOM are normal. Pupils are equal, round, and reactive to light. No scleral icterus.  Neck: Normal range of motion. Neck  supple. No tracheal deviation present.  Cardiovascular: Normal rate, regular rhythm and intact distal pulses.   Pulmonary/Chest: Effort normal and breath sounds normal. No respiratory distress. She exhibits no tenderness.  Abdominal: Soft. She exhibits no distension and no mass. There is tenderness. There is no rigidity, no guarding, no tenderness at McBurney's point and negative Murphy's sign. A hernia is present. Hernia confirmed negative in the right inguinal area and confirmed  negative in the left inguinal area.    Genitourinary: No vaginal discharge found.  Musculoskeletal: Normal range of motion. She exhibits edema. She exhibits no tenderness.  Limping due to gout flare on foot  Lymphadenopathy:    She has no cervical adenopathy.       Right: No inguinal adenopathy present.       Left: No inguinal adenopathy present.  Neurological: She is alert and oriented to person, place, and time. No cranial nerve deficit. She exhibits normal muscle tone. Coordination normal.  Skin: Skin is warm and dry. No rash noted. She is not diaphoretic. No erythema.  Psychiatric: She has a normal mood and affect. Her behavior is normal. Judgment and thought content normal.       Assessment:     Giant recurrent ventral wall incisional hernia in a smoking, poorly-controlled diabetic, morbidly-obese female    Plan:     I had a long discussion with the patient.  This is her second recurrence.  It is even larger than last time.  I do not think she is a candidate for laparoscopic approach since the 30 cm sheet was placed in there and the hernia recurred.  She will require major open resection with removal of mesh, component separation repair, complex abdominal reconstruction with giant sheets of mesh.  Probably partial panniculectomy as well.  This is beyond the scope of this practice.  She needs a consultation with a major academic Center.  I am certain that they will refuse to operate on her until she  completely stop smoking, get her diabetes under better control, and lose at least 30-50 pounds.  That will be difficult.  Discussed with my partner, Dr. Ninfa Linden, as well.  I think [facility]. Ned Clines group at [facility] in [place] would be the best shot for her if she can make the above criteria.  She noted that is too far away.  We will see if there is a contact person at [facility].    Hernia defect is so massive, the risk of strangulation is very low.  Recommend she have a fiber bowel regimen for her intermittent chronic abdominal pain.  She is over 50.  I strongly recommend she get a colonoscopy.  I strongly recommend she stop smoking.  That will help with her pain issues as well.  We talked to the patient about the dangers of smoking.  We stressed that tobacco use dramatically increases the risk of peri-operative complications such as infection, tissue necrosis leaving to problems with incision/wound and organ healing, heart attack, stroke, DVT, pulmonary embolism, and death.  We noted there are programs in our community to help stop smoking.   She was due to see her cardiologist for preoperative cardiac clearance.  I told her to keep the appointment as that will be a requisite for her to tolerate any surgery.

## 2012-12-09 NOTE — Patient Instructions (Addendum)
He will require major an open abdominal wall hernia repair with component separation.  The can really only be provided at a major academic center such as [facility] or preferably Dr. Halford Chessman group at [facility].   In order to be a candidate for surgery, you would need to get better control of your diabetes, completely stop smoking, & lose at least 30 pounds.  Challenging as it is, it is possible:  We strongly recommend that you stop smoking.  Smoking increases the risk of surgery including infection in the form of an open wound, pus formation, abscess, hernia at an incision on the abdomen, etc.  You have an increased risk of other MAJOR complications such as stroke, heart attack, forming clots in the leg and/or lungs, and death.    While it can be one of the most difficult things to do, the [place] community has programs to help you stop.  Consider talking with your primary care physician about options.  Also, Smoking Cessation classes are available through the [facility]:  The smoking cessation program is a proven-effective program from the American Lung Association. The program is available for anyone 84 and older who currently smokes. The program lasts for 7 weeks and is 8 sessions. Each class will be approximately 1 1/2 hours. The program is every Tuesday.  All classes are 12-1:30pm and same location.  Event Location Information:  Location: [place] 2nd Floor Conference Room 2-037; located next to [facility] cross streets: [address] Entrance into the [facility] is adjacent to the [employer] main entrance. The conference room is located on the 2nd floor.  Parking Instructions: Visitor parking is adjacent to [employer] main entrance and the [employer] [phone] or check the Classes and Support Groups   [email or web address] the event of  inclemet weather please call [phone] or view online at [email or web address]  Smoking Cessation, Tips for Success YOU CAN QUIT SMOKING If you are ready to quit smoking, congratulations! You have chosen to help yourself be healthier. Cigarettes bring nicotine, tar, carbon monoxide, and other irritants into your body. Your lungs, heart, and blood vessels will be able to work better without these poisons. There are many different ways to quit smoking. Nicotine gum, nicotine patches, a nicotine inhaler, or nicotine nasal spray can help with physical craving. Hypnosis, support groups, and medicines help break the habit of smoking. Here are some tips to help you quit for good.  Throw away all cigarettes.  Clean and remove all ashtrays from your home, work, and car.  On a card, write down your reasons for quitting. Carry the card with you and read it when you get the urge to smoke.  Cleanse your body of nicotine. Drink enough water and fluids to keep your urine clear or pale yellow. Do this after quitting to flush the nicotine from your body.  Learn to predict your moods. Do not let a bad situation be your excuse to have a cigarette. Some situations in your life might tempt you into wanting a cigarette.  Never have "just one" cigarette. It leads to wanting another and another. Remind yourself of your decision to quit.  Change habits associated with smoking. If you smoked while driving or when feeling stressed, try other activities to replace smoking. Stand up when drinking your coffee. Brush your teeth after eating. Sit in a different chair when you read the  paper. Avoid alcohol while trying to quit, and try to drink fewer caffeinated beverages. Alcohol and caffeine may urge you to smoke.  Avoid foods and drinks that can trigger a desire to smoke, such as sugary or spicy foods and alcohol.  Ask people who smoke not to smoke around you.  Have something planned to do right after eating or having a  cup of coffee. Take a walk or exercise to perk you up. This will help to keep you from overeating.  Try a relaxation exercise to calm you down and decrease your stress. Remember, you may be tense and nervous for the first 2 weeks after you quit, but this will pass.  Find new activities to keep your hands busy. Play with a pen, coin, or rubber band. Doodle or draw things on paper.  Brush your teeth right after eating. This will help cut down on the craving for the taste of tobacco after meals. You can try mouthwash, too.  Use oral substitutes, such as lemon drops, carrots, a cinnamon stick, or chewing gum, in place of cigarettes. Keep them handy so they are available when you have the urge to smoke.  When you have the urge to smoke, try deep breathing.  Designate your home as a nonsmoking area.  If you are a heavy smoker, ask your caregiver about a prescription for nicotine chewing gum. It can ease your withdrawal from nicotine.  Reward yourself. Set aside the cigarette money you save and buy yourself something nice.  Look for support from others. Join a support group or smoking cessation program. Ask someone at home or at work to help you with your plan to quit smoking.  Always ask yourself, "Do I need this cigarette or is this just a reflex?" Tell yourself, "Today, I choose not to smoke," or "I do not want to smoke." You are reminding yourself of your decision to quit, even if you do smoke a cigarette. HOW WILL I FEEL WHEN I QUIT SMOKING?  The benefits of not smoking start within days of quitting.  You may have symptoms of withdrawal because your body is used to nicotine (the addictive substance in cigarettes). You may crave cigarettes, be irritable, feel very hungry, cough often, get headaches, or have difficulty concentrating.  The withdrawal symptoms are only temporary. They are strongest when you first quit but will go away within 10 to 14 days.  When withdrawal symptoms occur, stay  in control. Think about your reasons for quitting. Remind yourself that these are signs that your body is healing and getting used to being without cigarettes.  Remember that withdrawal symptoms are easier to treat than the major diseases that smoking can cause.  Even after the withdrawal is over, expect periodic urges to smoke. However, these cravings are generally short-lived and will go away whether you smoke or not. Do not smoke!  If you relapse and smoke again, do not lose hope. Most smokers quit 3 times before they are successful.  If you relapse, do not give up! Plan ahead and think about what you will do the next time you get the urge to smoke. LIFE AS A NONSMOKER: MAKE IT FOR A MONTH, MAKE IT FOR LIFE Day 1: Hang this page where you will see it every day. Day 2: Get rid of all ashtrays, matches, and lighters. Day 3: Drink water. Breathe deeply between sips. Day 4: Avoid places with smoke-filled air, such as bars, clubs, or the smoking section of restaurants. Day 5:  Keep track of how much money you save by not smoking. Day 6: Avoid boredom. Keep a good book with you or go to the movies. Day 7: Reward yourself! One week without smoking! Day 8: Make a dental appointment to get your teeth cleaned. Day 9: Decide how you will turn down a cigarette before it is offered to you. Day 10: Review your reasons for quitting. Day 11: Distract yourself. Stay active to keep your mind off smoking and to relieve tension. Take a walk, exercise, read a book, do a crossword puzzle, or try a new hobby. Day 12: Exercise. Get off the bus before your stop or use stairs instead of escalators. Day 13: Call on friends for support and encouragement. Day 14: Reward yourself! Two weeks without smoking! Day 15: Practice deep breathing exercises. Day 16: Bet a friend that you can stay a nonsmoker. Day 17: Ask to sit in nonsmoking sections of restaurants. Day 18: Hang up "No Smoking" signs. Day 19: Think of  yourself as a nonsmoker. Day 20: Each morning, tell yourself you will not smoke. Day 21: Reward yourself! Three weeks without smoking! Day 22: Think of smoking in negative ways. Remember how it stains your teeth, gives you bad breath, and leaves you short of breath. Day 23: Eat a nutritious breakfast. Day 24:Do not relive your days as a smoker. Day 25: Hold a pencil in your hand when talking on the telephone. Day 26: Tell all your friends you do not smoke. Day 27: Think about how much better food tastes. Day 28: Remember, one cigarette is one too many. Day 29: Take up a hobby that will keep your hands busy. Day 30: Congratulations! One month without smoking! Give yourself a big reward. Your caregiver can direct you to community resources or hospitals for support, which may include:  Group support.  Education.  Hypnosis.  Subliminal therapy. Document Released: 06/02/2004 Document Revised: 11/27/2011 Document Reviewed: 06/21/2009 [facility] Patient Information 2013 [place].    Hernia A hernia occurs when an internal organ pushes out through a weak spot in the abdominal wall. Hernias most commonly occur in the groin and around the navel. Hernias often can be pushed back into place (reduced). Most hernias tend to get worse over time. Some abdominal hernias can get stuck in the opening (irreducible or incarcerated hernia) and cannot be reduced. An irreducible abdominal hernia which is tightly squeezed into the opening is at risk for impaired blood supply (strangulated hernia). A strangulated hernia is a medical emergency. Because of the risk for an irreducible or strangulated hernia, surgery may be recommended to repair a hernia. CAUSES   Heavy lifting.  Prolonged coughing.  Straining to have a bowel movement.  A cut (incision) made during an abdominal surgery. HOME CARE INSTRUCTIONS   Bed rest is not required. You may continue your normal activities.  Avoid lifting more  than 10 pounds (4.5 kg) or straining.  Cough gently. If you are a smoker it is best to stop. Even the best hernia repair can break down with the continual strain of coughing. Even if you do not have your hernia repaired, a cough will continue to aggravate the problem.  Do not wear anything tight over your hernia. Do not try to keep it in with an outside bandage or truss. These can damage abdominal contents if they are trapped within the hernia sac.  Eat a normal diet.  Avoid constipation. Straining over long periods of time will increase hernia size and encourage  breakdown of repairs. If you cannot do this with diet alone, stool softeners may be used. SEEK IMMEDIATE MEDICAL CARE IF:   You have a fever.  You develop increasing abdominal pain.  You feel nauseous or vomit.  Your hernia is stuck outside the abdomen, looks discolored, feels hard, or is tender.  You have any changes in your bowel habits or in the hernia that are unusual for you.  You have increased pain or swelling around the hernia.  You cannot push the hernia back in place by applying gentle pressure while lying down. MAKE SURE YOU:   Understand these instructions.  Will watch your condition.  Will get help right away if you are not doing well or get worse. Document Released: 09/04/2005 Document Revised: 11/27/2011 Document Reviewed: 04/23/2008 [facility] Patient Information 2013 [place].

## 2012-12-11 ENCOUNTER — Encounter: Payer: Self-pay | Admitting: *Deleted

## 2012-12-11 ENCOUNTER — Encounter: Payer: Self-pay | Admitting: Physician Assistant

## 2012-12-11 ENCOUNTER — Ambulatory Visit (INDEPENDENT_AMBULATORY_CARE_PROVIDER_SITE_OTHER): Payer: Medicare Other | Admitting: Physician Assistant

## 2012-12-11 VITALS — BP 159/87 | HR 65 | Ht 67.0 in | Wt 224.0 lb

## 2012-12-11 DIAGNOSIS — R0989 Other specified symptoms and signs involving the circulatory and respiratory systems: Secondary | ICD-10-CM

## 2012-12-11 DIAGNOSIS — I10 Essential (primary) hypertension: Secondary | ICD-10-CM

## 2012-12-11 DIAGNOSIS — I2510 Atherosclerotic heart disease of native coronary artery without angina pectoris: Secondary | ICD-10-CM

## 2012-12-11 DIAGNOSIS — F17200 Nicotine dependence, unspecified, uncomplicated: Secondary | ICD-10-CM

## 2012-12-11 NOTE — Assessment & Plan Note (Signed)
The importance of complete smoking cessation was emphasized

## 2012-12-11 NOTE — Assessment & Plan Note (Signed)
We'll order carotid Doppler study to rule out significant ICA disease.

## 2012-12-11 NOTE — Patient Instructions (Signed)
   Lexiscan cardiolite stress test  Carotid dopplers  Office will contact with results  Decrease Aspirin to 81mg  daily Continue all other current medications. Follow up in  1 month

## 2012-12-11 NOTE — Progress Notes (Addendum)
Primary Cardiologist: Daryel November, MD   HPI: Patient returns to our clinic after a prolonged absence, now referred for preoperative cardiac clearance.   She was recently evaluated by general surgery in [facility], regarding feasibility of umbilical hernia repair. She informs me today, however, that it was felt to be too high risk in her particular case, and was advised to inquire about having it done in [place], [place].  From a cardiac standpoint, she reports no significant change from her baseline level of occasional CP, which she reports has been ongoing ever since she underwent CABG in 2005. She denies any recent acceleration. The symptoms are not strictly correlated with exertion. She does, however, require an occasional NTG tablet. Her last episode was one week ago. She does have some mild exertional dyspnea.   Patient also, unfortunately, continues to smoke; currently has not smoked in 2 days.   12-lead EKG today, reviewed by me, indicates SB 56 bpm; nonspecific ST changes in high lateral leads  Allergies  Allergen Reactions  . Amoxicillin     REACTION: unspecified  . Penicillins     REACTION: Unknown    Current Outpatient Prescriptions  Medication Sig Dispense Refill  . aspirin 325 MG tablet Take 325 mg by mouth daily.        Marland Kitchen atorvastatin (LIPITOR) 80 MG tablet Take 80 mg by mouth daily.        . clopidogrel (PLAVIX) 75 MG tablet Take 75 mg by mouth daily.        . furosemide (LASIX) 40 MG tablet Take 40 mg by mouth daily.        Marland Kitchen glucose blood (ONE TOUCH ULTRA TEST) test strip 1 each by Other route 2 (two) times daily. And lancets 2/day 250.43  100 each  12  . insulin detemir (LEVEMIR) 100 UNIT/ML injection Inject 100 Units into the skin every morning. And pen needles 1/day      . Insulin Pen Needle 31G X 8 MM MISC Use as directed three times a day       . isosorbide dinitrate (ISORDIL) 20 MG tablet 3 tablets by mouth two times daily       . metoprolol tartrate  (LOPRESSOR) 25 MG tablet Take 25 mg by mouth 2 (two) times daily.        . nitroGLYCERIN (NITROSTAT) 0.4 MG SL tablet Place 0.4 mg under the tongue every 5 (five) minutes as needed. Not to exceed 3 in 15 minute time frame       . ONETOUCH DELICA LANCETS MISC       . quinapril (ACCUPRIL) 5 MG tablet Take 5 mg by mouth daily.         No current facility-administered medications for this visit.    Past Medical History  Diagnosis Date  . Hypercholesteremia   . Hypertension   . Diabetes mellitus   . DIABETES MELLITUS, TYPE II 03/31/2007  . HYPERLIPIDEMIA 03/31/2007  . Overweight 08/26/2009  . HYPERTENSION 03/31/2007  . CORONARY ARTERY DISEASE 03/31/2007  . ASTHMA 08/26/2009  . RENAL FAILURE 03/31/2007  . Gout   . SBO (small bowel obstruction) 2011    lap lysis of adhesions & hernia repair    Past Surgical History  Procedure Laterality Date  . Cholecystectomy  2010  . Hernia repair  1989    open w mesh  . Cesarean section  1984  . Cardiac catheterization  07/18/2004  . Coronary artery bypass graft  2005  . Cardiac stents  2006-2007  .  Incisional hernia repair x2  03/04/2010     laparoscopic with 35cm mesh by Dr Ronnald Collum  . Shoulder surgery      History   Social History  . Marital Status: Divorced    Spouse Name: N/A    Number of Children: N/A  . Years of Education: N/A   Occupational History  . Disabled    Social History Main Topics  . Smoking status: Former Smoker -- 1.00 packs/day for 20 years    Types: Cigarettes    Quit date: 12/10/2012  . Smokeless tobacco: Not on file  . Alcohol Use: No  . Drug Use: No  . Sexually Active: Not on file   Other Topics Concern  . 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 Diabetes Mother   . Heart disease Mother   . Hyperlipidemia Mother   . Thyroid disease Father     ROS: no nausea, vomiting; no fever, chills; no melena,  hematochezia; no claudication  PHYSICAL EXAM: BP 159/87  Pulse 65  Ht 5\' 7"  (1.702 m)  Wt 224 lb (101.606 kg)  BMI 35.08 kg/m2 GENERAL: 57 year-old female, obese ; NAD HEENT: NCAT, PERRLA, EOMI; sclera clear; no xanthelasma NECK: bilateral carotid bruits (R > L); no JVD; no TM LUNGS: CTA bilaterally CARDIAC: RRR (S1, S2); no significant murmurs; no rubs or gallops ABDOMEN: soft,  protuberant  EXTREMETIES: no significant peripheral edema SKIN: warm/dry; no obvious rash/lesions MUSCULOSKELETAL: no joint deformity NEURO: no focal deficit; NL affect   EKG: reviewed and available in Electronic Records   ASSESSMENT & PLAN:  CORONARY ARTERY DISEASE Will order a Lexiscan Cardiolite to rule out ischemia. Her last ischemic evaluation was a cardiac catheterization, April 2011, following presentation with NSTEMI, at which time she underwent "cutting balloon"  PTCA of high-grade ISR of distal S-OM graft anastomosis. Residual anatomy notable for severe 3v CAD with 3/3 patent bypass grafts; LVG deferred. She was advised to continue on ASA/Plavix, indefinitely. However, I will decrease ASA from full dose 81 mg daily, given that she remains on Plavix. Will have patient return for early followup for review of study results and further recommendations. In the meanwhile, she will confer with her primary M.D., Dr. Rory Percy, as to whether or not she should proceed further with finding a more suitable facility, where she can undergo surgery.  TOBACCO ABUSE The importance of complete smoking cessation was emphasized  HYPERTENSION Followed by primary M.D. Will reassess at time of next OV.  Carotid bruit We'll order carotid Doppler study to rule out significant ICA disease.    Gene Arpita Fentress, PAC

## 2012-12-11 NOTE — Assessment & Plan Note (Addendum)
Will order a Lexiscan Cardiolite to rule out ischemia. Her last ischemic evaluation was a cardiac catheterization, April 2011, following presentation with NSTEMI, at which time she underwent "cutting balloon"  PTCA of high-grade ISR of distal S-OM graft anastomosis. Residual anatomy notable for severe 3v CAD with 3/3 patent bypass grafts; LVG deferred. She was advised to continue on ASA/Plavix, indefinitely. However, I will decrease ASA from full dose 81 mg daily, given that she remains on Plavix. Will have patient return for early followup for review of study results and further recommendations. In the meanwhile, she will confer with her primary M.D., Dr. Rory Percy, as to whether or not she should proceed further with finding a more suitable facility, where she can undergo surgery.

## 2012-12-11 NOTE — Assessment & Plan Note (Signed)
Followed by primary M.D. Will reassess at time of next OV.

## 2012-12-12 ENCOUNTER — Other Ambulatory Visit: Payer: Self-pay | Admitting: *Deleted

## 2012-12-12 DIAGNOSIS — I2510 Atherosclerotic heart disease of native coronary artery without angina pectoris: Secondary | ICD-10-CM

## 2012-12-16 ENCOUNTER — Telehealth: Payer: Self-pay | Admitting: Physician Assistant

## 2012-12-16 NOTE — Telephone Encounter (Signed)
Lexiscan cardiolite Weight 224  Diagnosis: 414.00, 305.1, 401.9  Tuesday, December 17 2012 @ [facility] Checking percert

## 2012-12-16 NOTE — Telephone Encounter (Signed)
Auth # [id number] exp 01/30/13

## 2012-12-17 DIAGNOSIS — I2510 Atherosclerotic heart disease of native coronary artery without angina pectoris: Secondary | ICD-10-CM

## 2012-12-18 ENCOUNTER — Encounter: Payer: Self-pay | Admitting: *Deleted

## 2012-12-18 ENCOUNTER — Telehealth: Payer: Self-pay | Admitting: Physician Assistant

## 2012-12-18 ENCOUNTER — Ambulatory Visit (INDEPENDENT_AMBULATORY_CARE_PROVIDER_SITE_OTHER): Payer: Medicare Other | Admitting: Physician Assistant

## 2012-12-18 VITALS — BP 174/100 | HR 66 | Ht 66.0 in | Wt 223.0 lb

## 2012-12-18 DIAGNOSIS — R0989 Other specified symptoms and signs involving the circulatory and respiratory systems: Secondary | ICD-10-CM

## 2012-12-18 DIAGNOSIS — I2510 Atherosclerotic heart disease of native coronary artery without angina pectoris: Secondary | ICD-10-CM

## 2012-12-18 DIAGNOSIS — F17200 Nicotine dependence, unspecified, uncomplicated: Secondary | ICD-10-CM

## 2012-12-18 DIAGNOSIS — I10 Essential (primary) hypertension: Secondary | ICD-10-CM

## 2012-12-18 DIAGNOSIS — E785 Hyperlipidemia, unspecified: Secondary | ICD-10-CM

## 2012-12-18 DIAGNOSIS — N289 Disorder of kidney and ureter, unspecified: Secondary | ICD-10-CM

## 2012-12-18 MED ORDER — NITROGLYCERIN 0.4 MG SL SUBL: 0.4000 mg | SUBLINGUAL_TABLET | SUBLINGUAL | Status: DC | PRN

## 2012-12-18 NOTE — Addendum Note (Signed)
Addended by: Laurine Blazer on: 12/18/2012 03:25 PM   Modules accepted: Orders

## 2012-12-18 NOTE — Assessment & Plan Note (Signed)
Carotid Dopplers have been scheduled.

## 2012-12-18 NOTE — Patient Instructions (Signed)
   Left heart cath - see info sheet given Continue all current medications. Follow up will be given after procedure

## 2012-12-18 NOTE — Progress Notes (Signed)
Primary Cardiologist: Daryel November, MD   HPI: Patient returns as an add-on for evaluation of an abnormal Lexiscan Cardiolite test, which I ordered on March 26, when she was referred for preoperative cardiac clearance.  The study was completed yesterday and reviewed by Dr. Domenic Polite, who noted abnormal LV perfusion consistent with a high risk study. Specifically, there was a large area of moderate ischemia within the inferior/lateral walls, as well as borderline increased TID ratio suggestive of subendocardial ischemia; EF 57%.  Patient does report today having experienced some CP during the pharmacologic nuclear imaging study, similar to prior symptoms. She currently denies having had any CP today.  Allergies  Allergen Reactions  . Amoxicillin     REACTION: unspecified  . Penicillins     REACTION: Unknown    Current Outpatient Prescriptions  Medication Sig Dispense Refill  . atorvastatin (LIPITOR) 80 MG tablet Take 80 mg by mouth daily.        . clopidogrel (PLAVIX) 75 MG tablet Take 75 mg by mouth daily.        . furosemide (LASIX) 40 MG tablet Take 40 mg by mouth daily.        Marland Kitchen glucose blood (ONE TOUCH ULTRA TEST) test strip 1 each by Other route 2 (two) times daily. And lancets 2/day 250.43  100 each  12  . insulin detemir (LEVEMIR) 100 UNIT/ML injection Inject 100 Units into the skin every morning. And pen needles 1/day      . Insulin Pen Needle 31G X 8 MM MISC Use as directed three times a day       . isosorbide dinitrate (ISORDIL) 20 MG tablet 3 tablets by mouth two times daily       . metoprolol tartrate (LOPRESSOR) 25 MG tablet Take 25 mg by mouth 2 (two) times daily.        . nitroGLYCERIN (NITROSTAT) 0.4 MG SL tablet Place 0.4 mg under the tongue every 5 (five) minutes as needed. Not to exceed 3 in 15 minute time frame       . ONETOUCH DELICA LANCETS MISC       . quinapril (ACCUPRIL) 5 MG tablet Take 5 mg by mouth daily.         No current facility-administered medications for  this visit.    Past Medical History  Diagnosis Date  . Hypercholesteremia   . Hypertension   . Diabetes mellitus   . DIABETES MELLITUS, TYPE II 03/31/2007  . HYPERLIPIDEMIA 03/31/2007  . Overweight 08/26/2009  . HYPERTENSION 03/31/2007  . CORONARY ARTERY DISEASE 03/31/2007  . ASTHMA 08/26/2009  . RENAL FAILURE 03/31/2007  . Gout   . SBO (small bowel obstruction) 2011    lap lysis of adhesions & hernia repair    Past Surgical History  Procedure Laterality Date  . Cholecystectomy  2010  . Hernia repair  1989    open w mesh  . Cesarean section  1984  . Cardiac catheterization  07/18/2004  . Coronary artery bypass graft  2005  . Cardiac stents  2006-2007  . Incisional hernia repair x2  03/04/2010     laparoscopic with 35cm mesh by Dr Ronnald Collum  . Shoulder surgery      History   Social History  . Marital Status: Divorced    Spouse Name: N/A    Number of Children: N/A  . Years of Education: N/A   Occupational History  . Disabled    Social History Main Topics  . Smoking status: Former Smoker --  1.00 packs/day for 20 years    Types: Cigarettes    Quit date: 12/10/2012  . Smokeless tobacco: Not on file  . Alcohol Use: No  . Drug Use: No  . Sexually Active: Not on file   Other Topics Concern  . Not on file   Social History Narrative   Takes care of a boyfriend whom ia also disabled   Social History Narrative   Takes care of a boyfriend whom ia also disabled    Problem Relation Age of Onset  . Diabetes Mother   . Heart disease Mother   . Hyperlipidemia Mother   . Thyroid disease Father     ROS: no nausea, vomiting; no fever, chills; no melena, hematochezia; reports left calf claudication  PHYSICAL EXAM: BP 204/110  Pulse 69  Ht 5\' 6"  (1.676 m)  Wt 223 lb (101.152 kg)  BMI 36.01 kg/m2 GENERAL: 57 year-old female, obese; NAD  HEENT: NCAT, PERRLA, EOMI; sclera clear; left eyelid xanthelasma  NECK: bilateral carotid bruits (R > L); no JVD; no TM  LUNGS: CTA  bilaterally  CARDIAC: RRR (S1, S2); soft, grade 2/6 systolic ejection murmur at base; no rubs or gallops  ABDOMEN: soft, protuberant; no abdominal bruits  EXTREMETIES: Palpable femoral pulses, without bruits; palpable PTs; no significant peripheral edema  SKIN: warm/dry; no obvious rash/lesions  MUSCULOSKELETAL: no joint deformity  NEURO: no focal deficit; NL affect    EKG:    ASSESSMENT & PLAN:  CORONARY ARTERY DISEASE Following review of her recent perfusion study results with Dr. Ron Parker, recommendation is to proceed with diagnostic coronary angiography and possible percutaneous intervention. Patient is in agreement with this recommendation, and the risks/benefits have been discussed. Patient is currently stable, reporting no recurrent chest pain this morning. Nevertheless, given the high risk interpretation of the study, we will arrange to have the procedure done tomorrow. Also of note, patient does need to undergo umbilical hernia repair in the near future. Consequently, recommendation is to confer with Dr. Ron Parker, following the results of the diagnostic cardiac catheterization, so as to make the best determination regarding what type of stent to apply, if percutaneous intervention is indicated.  HYPERTENSION Patient returns today with uncontrolled HTN. She admits that she did not take any of her AM medications. We'll treat with NTG 0.4 mg tablets, prior to releasing her from the office today.  Carotid bruit Carotid Dopplers have been scheduled.  TOBACCO ABUSE She has now quit smoking for one week.  HYPERLIPIDEMIA Continue high dose statin therapy, pending further recommendations.  Renal insufficiency We'll check baseline labs and hold Lasix in the interim, starting today. Continue ACE inhibitor therapy, pending review of lab results, but to be held AM of procedure.   Gene Errin Whitelaw, PAC

## 2012-12-18 NOTE — Progress Notes (Signed)
12:05 - #1 NTG given SL per Gene Serpe, PA. 12:12 - BP check - Left - 174/100  66                                Right - 206/92  63  12:15 - #2 NTG given SL per Gene Serpe, PA. 12:25 - BP check - Left - 190/99  63                                Right - 183/81  60  Gene Serpe, PA aware of above.

## 2012-12-18 NOTE — Assessment & Plan Note (Signed)
Continue high dose statin therapy, pending further recommendations.

## 2012-12-18 NOTE — Assessment & Plan Note (Signed)
Following review of her recent perfusion study results with Dr. Ron Parker, recommendation is to proceed with diagnostic coronary angiography and possible percutaneous intervention. Patient is in agreement with this recommendation, and the risks/benefits have been discussed. Patient is currently stable, reporting no recurrent chest pain this morning. Nevertheless, given the high risk interpretation of the study, we will arrange to have the procedure done tomorrow. Also of note, patient does need to undergo umbilical hernia repair in the near future. Consequently, recommendation is to confer with Dr. Ron Parker, following the results of the diagnostic cardiac catheterization, so as to make the best determination regarding what type of stent to apply, if percutaneous intervention is indicated.

## 2012-12-18 NOTE — Telephone Encounter (Signed)
Left heart cath - main lab - Thursday, 4/3 - 9:00 - Johnsie Cancel

## 2012-12-18 NOTE — Assessment & Plan Note (Signed)
We'll check baseline labs and hold Lasix in the interim. Continue ACE inhibitor therapy, pending review of lab results.

## 2012-12-18 NOTE — Assessment & Plan Note (Signed)
Patient returns today with uncontrolled HTN. She admits that she did not take any of her a.m. medications.

## 2012-12-18 NOTE — Assessment & Plan Note (Signed)
She has now quit smoking for one week.

## 2012-12-19 ENCOUNTER — Encounter (HOSPITAL_COMMUNITY)
Admission: RE | Disposition: A | Discharge status: Home / Self Care | Payer: Self-pay | Source: Ambulatory Visit | Attending: Cardiovascular Disease

## 2012-12-19 ENCOUNTER — Encounter (HOSPITAL_COMMUNITY): Payer: Self-pay | Admitting: General Practice

## 2012-12-19 ENCOUNTER — Ambulatory Visit (HOSPITAL_COMMUNITY)
Admission: RE | Admit: 2012-12-19 | Discharge: 2012-12-20 | Disposition: A | Discharge status: Home / Self Care | Payer: Medicare Other | Source: Ambulatory Visit | Attending: Cardiovascular Disease | Admitting: Cardiovascular Disease

## 2012-12-19 DIAGNOSIS — Z955 Presence of coronary angioplasty implant and graft: Secondary | ICD-10-CM

## 2012-12-19 DIAGNOSIS — E669 Obesity, unspecified: Secondary | ICD-10-CM | POA: Diagnosis present

## 2012-12-19 DIAGNOSIS — Y831 Surgical operation with implant of artificial internal device as the cause of abnormal reaction of the patient, or of later complication, without mention of misadventure at the time of the procedure: Secondary | ICD-10-CM | POA: Insufficient documentation

## 2012-12-19 DIAGNOSIS — R079 Chest pain, unspecified: Secondary | ICD-10-CM | POA: Insufficient documentation

## 2012-12-19 DIAGNOSIS — I2510 Atherosclerotic heart disease of native coronary artery without angina pectoris: Secondary | ICD-10-CM

## 2012-12-19 DIAGNOSIS — I200 Unstable angina: Secondary | ICD-10-CM

## 2012-12-19 DIAGNOSIS — F17200 Nicotine dependence, unspecified, uncomplicated: Secondary | ICD-10-CM | POA: Diagnosis present

## 2012-12-19 DIAGNOSIS — R9439 Abnormal result of other cardiovascular function study: Secondary | ICD-10-CM

## 2012-12-19 DIAGNOSIS — T82897A Other specified complication of cardiac prosthetic devices, implants and grafts, initial encounter: Secondary | ICD-10-CM | POA: Insufficient documentation

## 2012-12-19 DIAGNOSIS — E785 Hyperlipidemia, unspecified: Secondary | ICD-10-CM

## 2012-12-19 DIAGNOSIS — I10 Essential (primary) hypertension: Secondary | ICD-10-CM | POA: Insufficient documentation

## 2012-12-19 DIAGNOSIS — I9589 Other hypotension: Secondary | ICD-10-CM | POA: Diagnosis present

## 2012-12-19 DIAGNOSIS — I25810 Atherosclerosis of coronary artery bypass graft(s) without angina pectoris: Secondary | ICD-10-CM | POA: Insufficient documentation

## 2012-12-19 DIAGNOSIS — E119 Type 2 diabetes mellitus without complications: Secondary | ICD-10-CM | POA: Insufficient documentation

## 2012-12-19 DIAGNOSIS — I209 Angina pectoris, unspecified: Secondary | ICD-10-CM | POA: Insufficient documentation

## 2012-12-19 DIAGNOSIS — E7800 Pure hypercholesterolemia, unspecified: Secondary | ICD-10-CM | POA: Insufficient documentation

## 2012-12-19 DIAGNOSIS — Z7902 Long term (current) use of antithrombotics/antiplatelets: Secondary | ICD-10-CM | POA: Insufficient documentation

## 2012-12-19 DIAGNOSIS — E782 Mixed hyperlipidemia: Secondary | ICD-10-CM | POA: Diagnosis present

## 2012-12-19 HISTORY — DX: Chronic kidney disease, stage 3 unspecified: N18.30

## 2012-12-19 HISTORY — DX: Chronic kidney disease, stage 3 (moderate): N18.3

## 2012-12-19 HISTORY — PX: LEFT HEART CATHETERIZATION WITH CORONARY ANGIOGRAM: SHX5451

## 2012-12-19 HISTORY — PX: PERCUTANEOUS CORONARY STENT INTERVENTION (PCI-S): SHX5485

## 2012-12-19 LAB — GLUCOSE, CAPILLARY
Glucose-Capillary: 211 mg/dL — ABNORMAL HIGH (ref 70–99)
Glucose-Capillary: 322 mg/dL — ABNORMAL HIGH (ref 70–99)
Glucose-Capillary: 192 mg/dL — ABNORMAL HIGH (ref 70–99)

## 2012-12-19 LAB — PLATELET INHIBITION P2Y12: Platelet Function  P2Y12: 200 [PRU] (ref 194–418)

## 2012-12-19 LAB — CBC
HCT: 40 % (ref 36.0–46.0)
Hemoglobin: 14.2 g/dL (ref 12.0–15.0)
RBC: 4.9 MIL/uL (ref 3.87–5.11)

## 2012-12-19 LAB — BASIC METABOLIC PANEL
Chloride: 100 mEq/L (ref 96–112)
GFR calc Af Amer: 51 mL/min — ABNORMAL LOW (ref >90)
GFR calc non Af Amer: 44 mL/min — ABNORMAL LOW (ref >90)
Glucose, Bld: 273 mg/dL — ABNORMAL HIGH (ref 70–99)
Potassium: 4.2 mEq/L (ref 3.5–5.1)
Sodium: 132 mEq/L — ABNORMAL LOW (ref 135–145)

## 2012-12-19 LAB — PROTIME-INR: Prothrombin Time: 12.3 seconds (ref 11.6–15.2)

## 2012-12-19 SURGERY — LEFT HEART CATHETERIZATION WITH CORONARY ANGIOGRAM
Anesthesia: LOCAL | Site: Groin | Laterality: Right

## 2012-12-19 MED ORDER — HEPARIN (PORCINE) IN NACL 2-0.9 UNIT/ML-% IJ SOLN
INTRAMUSCULAR | Status: AC
  Filled 2012-12-19: qty 1000

## 2012-12-19 MED ORDER — LIDOCAINE HCL (PF) 1 % IJ SOLN
INTRAMUSCULAR | Status: AC
  Filled 2012-12-19: qty 30

## 2012-12-19 MED ORDER — BIVALIRUDIN 250 MG IV SOLR
INTRAVENOUS | Status: AC
  Filled 2012-12-19: qty 250

## 2012-12-19 MED ORDER — LISINOPRIL 5 MG PO TABS
5.0000 mg | ORAL_TABLET | Freq: Every day | ORAL | Status: DC
  Administered 2012-12-19 – 2012-12-20 (×2): 5 mg via ORAL
  Filled 2012-12-19 (×2): qty 1

## 2012-12-19 MED ORDER — ASPIRIN EC 81 MG PO TBEC
81.0000 mg | DELAYED_RELEASE_TABLET | Freq: Every day | ORAL | Status: DC
  Administered 2012-12-20: 09:00:00 81 mg via ORAL
  Filled 2012-12-19: qty 1

## 2012-12-19 MED ORDER — ASPIRIN 81 MG PO TABS
81.0000 mg | ORAL_TABLET | Freq: Every day | ORAL | Status: DC
Start: 2012-12-19 — End: 2012-12-19

## 2012-12-19 MED ORDER — CLOPIDOGREL BISULFATE 300 MG PO TABS
ORAL_TABLET | ORAL | Status: AC
  Filled 2012-12-19: qty 2

## 2012-12-19 MED ORDER — ONDANSETRON HCL 4 MG/2ML IJ SOLN
4.0000 mg | Freq: Four times a day (QID) | INTRAMUSCULAR | Status: DC | PRN
  Administered 2012-12-20: 4 mg via INTRAVENOUS
  Filled 2012-12-19: qty 2

## 2012-12-19 MED ORDER — ISOSORBIDE DINITRATE 20 MG PO TABS
20.0000 mg | ORAL_TABLET | Freq: Two times a day (BID) | ORAL | Status: DC
  Administered 2012-12-19 – 2012-12-20 (×3): 20 mg via ORAL
  Filled 2012-12-19 (×4): qty 1

## 2012-12-19 MED ORDER — HYDRALAZINE HCL 20 MG/ML IJ SOLN: 5.0000 mg | Freq: Once | INTRAMUSCULAR | Status: DC

## 2012-12-19 MED ORDER — FENTANYL CITRATE 0.05 MG/ML IJ SOLN
INTRAMUSCULAR | Status: AC
  Filled 2012-12-19: qty 2

## 2012-12-19 MED ORDER — HYDRALAZINE HCL 20 MG/ML IJ SOLN
10.0000 mg | Freq: Four times a day (QID) | INTRAMUSCULAR | Status: DC | PRN
  Administered 2012-12-19: 12:00:00 10 mg via INTRAVENOUS
  Filled 2012-12-19: qty 1

## 2012-12-19 MED ORDER — INSULIN DETEMIR 100 UNIT/ML ~~LOC~~ SOLN
100.0000 [IU] | Freq: Every day | SUBCUTANEOUS | Status: DC
Start: 2012-12-20 — End: 2012-12-20
  Administered 2012-12-20: 100 [IU] via SUBCUTANEOUS
  Filled 2012-12-19: qty 1

## 2012-12-19 MED ORDER — INSULIN ASPART 100 UNIT/ML ~~LOC~~ SOLN
0.0000 [IU] | Freq: Three times a day (TID) | SUBCUTANEOUS | Status: DC
  Administered 2012-12-19: 18:00:00 7 [IU] via SUBCUTANEOUS
  Administered 2012-12-20: 09:00:00 5 [IU] via SUBCUTANEOUS

## 2012-12-19 MED ORDER — ATORVASTATIN CALCIUM 80 MG PO TABS
80.0000 mg | ORAL_TABLET | Freq: Every day | ORAL | Status: DC
  Administered 2012-12-19: 18:00:00 80 mg via ORAL
  Filled 2012-12-19 (×2): qty 1

## 2012-12-19 MED ORDER — NITROGLYCERIN 1 MG/10 ML FOR IR/CATH LAB
INTRA_ARTERIAL | Status: AC
  Filled 2012-12-19: qty 10

## 2012-12-19 MED ORDER — NITROGLYCERIN 0.4 MG SL SUBL: 0.4000 mg | SUBLINGUAL_TABLET | SUBLINGUAL | Status: DC | PRN

## 2012-12-19 MED ORDER — ASPIRIN 81 MG PO CHEW
324.0000 mg | CHEWABLE_TABLET | ORAL | Status: AC
  Administered 2012-12-19: 324 mg via ORAL
  Filled 2012-12-19: qty 4

## 2012-12-19 MED ORDER — ACETAMINOPHEN 325 MG PO TABS: 650.0000 mg | ORAL_TABLET | ORAL | Status: DC | PRN

## 2012-12-19 MED ORDER — MORPHINE SULFATE 2 MG/ML IJ SOLN
1.0000 mg | Freq: Once | INTRAMUSCULAR | Status: AC
  Administered 2012-12-19: 1 mg via INTRAVENOUS
  Filled 2012-12-19: qty 1

## 2012-12-19 MED ORDER — SODIUM CHLORIDE 0.9 % IV SOLN
1.0000 mL/kg/h | INTRAVENOUS | Status: AC
  Administered 2012-12-19: 1 mL/kg/h via INTRAVENOUS

## 2012-12-19 MED ORDER — MIDAZOLAM HCL 2 MG/2ML IJ SOLN
INTRAMUSCULAR | Status: AC
  Filled 2012-12-19: qty 2

## 2012-12-19 MED ORDER — METOPROLOL TARTRATE 25 MG PO TABS
25.0000 mg | ORAL_TABLET | Freq: Two times a day (BID) | ORAL | Status: DC
  Administered 2012-12-19 – 2012-12-20 (×2): 25 mg via ORAL
  Filled 2012-12-19 (×4): qty 1

## 2012-12-19 MED ORDER — SODIUM CHLORIDE 0.9 % IV SOLN: 250.0000 mL | INTRAVENOUS | Status: DC | PRN

## 2012-12-19 MED ORDER — SODIUM CHLORIDE 0.9 % IJ SOLN: 3.0000 mL | INTRAMUSCULAR | Status: DC | PRN

## 2012-12-19 MED ORDER — CLOPIDOGREL BISULFATE 75 MG PO TABS
75.0000 mg | ORAL_TABLET | Freq: Every day | ORAL | Status: DC
  Administered 2012-12-20: 08:00:00 75 mg via ORAL
  Filled 2012-12-19: qty 1

## 2012-12-19 MED ORDER — FUROSEMIDE 40 MG PO TABS
40.0000 mg | ORAL_TABLET | Freq: Every day | ORAL | Status: DC
Start: 2012-12-20 — End: 2012-12-20
  Administered 2012-12-20: 40 mg via ORAL
  Filled 2012-12-19: qty 1

## 2012-12-19 MED ORDER — SODIUM CHLORIDE 0.9 % IV SOLN
INTRAVENOUS | Status: DC
  Administered 2012-12-19: 09:00:00 via INTRAVENOUS

## 2012-12-19 MED ORDER — LISINOPRIL 5 MG PO TABS
5.0000 mg | ORAL_TABLET | Freq: Every day | ORAL | Status: DC
  Filled 2012-12-19: qty 1

## 2012-12-19 MED ORDER — SODIUM CHLORIDE 0.9 % IJ SOLN: 3.0000 mL | Freq: Two times a day (BID) | INTRAMUSCULAR | Status: DC

## 2012-12-19 NOTE — CV Procedure (Signed)
   CARDIAC CATH NOTE  Name: DESMARIE NANDIN MRN: [id number] DOB: Feb 04, 1956  Procedure: PTCA and stenting of the left main and left circumflex artery.  Indication: 57 year old white female with extensive coronary history. She is status post coronary bypass surgery. She had prior stenting of the left main and proximal left circumflex in 2005 with a 3.0 x 20 mm Taxus stent. She has chest pain consistent with class II angina. She has been on 2 antianginal agents. Recently she was evaluated preoperatively for hernia repair. Myoview study demonstrated large area of inferior lateral ischemia. Diagnostic study demonstrated severe in-stent restenosis in the left main and proximal left circumflex up to 80-90%. There was disease that extended distal to the stent. The first obtuse marginal vessel was severely and diffusely diseased. The patient has a patent LIMA graft to the LAD and patent vein graft to the right coronary. The vein graft to the first OM is occluded.  Procedural Details: The right groin was prepped, draped, and anesthetized with 1% lidocaine. Using the modified Seldinger technique, a 6 Fr sheath was introduced into the right femoral artery.  Plavix 600 mg was given orally. Weight-based bivalirudin was given for anticoagulation. Once a therapeutic ACT was achieved, a 6 [place] XB LAD 4 guide catheter was inserted.  A pro-water coronary guidewire was used to cross the lesion.  The lesion was predilated with a 2.5 mm balloon.  We then dilated the lesion further with a 3.0 mm cutting balloon. The lesion was then stented with a 3.0 x 28 mm Promus premier stent, extending from the left main into the proximal left circumflex.  The stent was postdilated with a 3.5 mm noncompliant balloon.  Following PCI, there was less than 10%  residual stenosis and TIMI-3 flow. The first obtuse marginal vessel was still patent. Final angiography confirmed an excellent result. The patient tolerated the procedure well. There  were no immediate procedural complications. Femoral hemostasis was achieved with manual compression. The patient was transferred to the post catheterization recovery area for further monitoring.  Lesion Data: Vessel: Left main and proximal left circumflex artery Percent stenosis (pre): 80-90% TIMI-flow (pre):  3 Stent:  3.0 x 28 mm Promus premier Percent stenosis (post): Less than 10% TIMI-flow (post): 3  Conclusions: Successful intracoronary stenting of the left main and proximal left circumflex coronary with a drug-eluting stent for diffuse in-stent restenosis.  Recommendations: Recommend dual antiplatelet therapy for one year. I would recommend postponing elective hernia repair.  [name] [facility] 12/19/2012, 10:55 AM

## 2012-12-19 NOTE — H&P (View-Only) (Signed)
Primary Cardiologist: Daryel November, MD   HPI: Patient returns as an add-on for evaluation of an abnormal Lexiscan Cardiolite test, which I ordered on March 26, when she was referred for preoperative cardiac clearance.  The study was completed yesterday and reviewed by Dr. Domenic Polite, who noted abnormal LV perfusion consistent with a high risk study. Specifically, there was a large area of moderate ischemia within the inferior/lateral walls, as well as borderline increased TID ratio suggestive of subendocardial ischemia; EF 57%.  Patient does report today having experienced some CP during the pharmacologic nuclear imaging study, similar to prior symptoms. She currently denies having had any CP today.  Allergies  Allergen Reactions  . Amoxicillin     REACTION: unspecified  . Penicillins     REACTION: Unknown    Current Outpatient Prescriptions  Medication Sig Dispense Refill  . atorvastatin (LIPITOR) 80 MG tablet Take 80 mg by mouth daily.        . clopidogrel (PLAVIX) 75 MG tablet Take 75 mg by mouth daily.        . furosemide (LASIX) 40 MG tablet Take 40 mg by mouth daily.        Marland Kitchen glucose blood (ONE TOUCH ULTRA TEST) test strip 1 each by Other route 2 (two) times daily. And lancets 2/day 250.43  100 each  12  . insulin detemir (LEVEMIR) 100 UNIT/ML injection Inject 100 Units into the skin every morning. And pen needles 1/day      . Insulin Pen Needle 31G X 8 MM MISC Use as directed three times a day       . isosorbide dinitrate (ISORDIL) 20 MG tablet 3 tablets by mouth two times daily       . metoprolol tartrate (LOPRESSOR) 25 MG tablet Take 25 mg by mouth 2 (two) times daily.        . nitroGLYCERIN (NITROSTAT) 0.4 MG SL tablet Place 0.4 mg under the tongue every 5 (five) minutes as needed. Not to exceed 3 in 15 minute time frame       . ONETOUCH DELICA LANCETS MISC       . quinapril (ACCUPRIL) 5 MG tablet Take 5 mg by mouth daily.         No current facility-administered medications for  this visit.    Past Medical History  Diagnosis Date  . Hypercholesteremia   . Hypertension   . Diabetes mellitus   . DIABETES MELLITUS, TYPE II 03/31/2007  . HYPERLIPIDEMIA 03/31/2007  . Overweight 08/26/2009  . HYPERTENSION 03/31/2007  . CORONARY ARTERY DISEASE 03/31/2007  . ASTHMA 08/26/2009  . RENAL FAILURE 03/31/2007  . Gout   . SBO (small bowel obstruction) 2011    lap lysis of adhesions & hernia repair    Past Surgical History  Procedure Laterality Date  . Cholecystectomy  2010  . Hernia repair  1989    open w mesh  . Cesarean section  1984  . Cardiac catheterization  07/18/2004  . Coronary artery bypass graft  2005  . Cardiac stents  2006-2007  . Incisional hernia repair x2  03/04/2010     laparoscopic with 35cm mesh by Dr Ronnald Collum  . Shoulder surgery      History   Social History  . Marital Status: Divorced    Spouse Name: N/A    Number of Children: N/A  . Years of Education: N/A   Occupational History  . Disabled    Social History Main Topics  . Smoking status: Former Smoker --  1.00 packs/day for 20 years    Types: Cigarettes    Quit date: 12/10/2012  . Smokeless tobacco: Not on file  . Alcohol Use: No  . Drug Use: No  . Sexually Active: Not on file   Other Topics Concern  . Not on file   Social History Narrative   Takes care of a boyfriend whom ia also disabled   Social History Narrative   Takes care of a boyfriend whom ia also disabled    Problem Relation Age of Onset  . Diabetes Mother   . Heart disease Mother   . Hyperlipidemia Mother   . Thyroid disease Father     ROS: no nausea, vomiting; no fever, chills; no melena, hematochezia; reports left calf claudication  PHYSICAL EXAM: BP 204/110  Pulse 69  Ht 5\' 6"  (1.676 m)  Wt 223 lb (101.152 kg)  BMI 36.01 kg/m2 GENERAL: 57 year-old female, obese; NAD  HEENT: NCAT, PERRLA, EOMI; sclera clear; left eyelid xanthelasma  NECK: bilateral carotid bruits (R > L); no JVD; no TM  LUNGS: CTA  bilaterally  CARDIAC: RRR (S1, S2); soft, grade 2/6 systolic ejection murmur at base; no rubs or gallops  ABDOMEN: soft, protuberant; no abdominal bruits  EXTREMETIES: Palpable femoral pulses, without bruits; palpable PTs; no significant peripheral edema  SKIN: warm/dry; no obvious rash/lesions  MUSCULOSKELETAL: no joint deformity  NEURO: no focal deficit; NL affect    EKG:    ASSESSMENT & PLAN:  CORONARY ARTERY DISEASE Following review of her recent perfusion study results with Dr. Ron Parker, recommendation is to proceed with diagnostic coronary angiography and possible percutaneous intervention. Patient is in agreement with this recommendation, and the risks/benefits have been discussed. Patient is currently stable, reporting no recurrent chest pain this morning. Nevertheless, given the high risk interpretation of the study, we will arrange to have the procedure done tomorrow. Also of note, patient does need to undergo umbilical hernia repair in the near future. Consequently, recommendation is to confer with Dr. Ron Parker, following the results of the diagnostic cardiac catheterization, so as to make the best determination regarding what type of stent to apply, if percutaneous intervention is indicated.  HYPERTENSION Patient returns today with uncontrolled HTN. She admits that she did not take any of her AM medications. We'll treat with NTG 0.4 mg tablets, prior to releasing her from the office today.  Carotid bruit Carotid Dopplers have been scheduled.  TOBACCO ABUSE She has now quit smoking for one week.  HYPERLIPIDEMIA Continue high dose statin therapy, pending further recommendations.  Renal insufficiency We'll check baseline labs and hold Lasix in the interim, starting today. Continue ACE inhibitor therapy, pending review of lab results, but to be held AM of procedure.   Gene Aijah Lattner, PAC

## 2012-12-19 NOTE — CV Procedure (Signed)
      Catheterization   Indication: Abnormal myovue  Procedure: After informed consent and clinical "time out" the right groin was prepped and draped in a sterile fashion.  A 5Fr sheath was placed in the right femoral artery using seldinger technique and local lidocaine.  Standard JL4, JR4 and angled pigtail catheters were used to engage the coronary arteries.  Coronary arteries were visualized in orthogonal views using caudal and cranial angulation.  RAO ventriculography was done using 28 * cc of contrast.    Medications:   Versed: 3 mg's  Fentanyl: 25 ug's  Coronary Arteries: Right dominant with no anomalies  LM: 90% distal in stent restenosis  LAD: 90% ostial with patent lima distal vessel diffusely diseased     D1: 80% ostial  Circumflex:  Left dominant with 90% instent restenosis of the LM stent that enters proximal circumflex.  80% lesion just distal to stent as well   OM1: 90% diffuse disease with subtotally occluded stent distally   RCA: 90% proximal non dominant with competitive flow Distal RV/RCA fills from SVG  SVG to OM : occluded SVG to RCA: patent Jeffers to LAD patent       Ventriculography: EF: 60 %, no RWMA;s  Hemodynamics:  Aortic Pressure: 161/ 95 mmHg  LV Pressure: 161/4 mmHg  Impression:  Reviewed films with Dr Martinique and discussed with Dr Katz/  Hernia surgery is elective.  No way to adequately attack in stent restenosis with BMS  Will proceed with DES to LM/circumflex as this is area of ischemia on Dimple Nanas 12/19/2012 9:49 AM

## 2012-12-19 NOTE — Progress Notes (Signed)
Site area: right groin  Site Prior to Removal:  Level 0  Pressure Applied For 20 MINUTES    Minutes Beginning at 1425  Manual:   yes  Patient Status During Pull:  AAO X 3  Post Pull Groin Site:  Level 0  Post Pull Instructions Given:  yes  Post Pull Pulses Present:  yes  Dressing Applied:  yes  Comments:  Tolerated procedyre well

## 2012-12-19 NOTE — Interval H&P Note (Signed)
History and Physical Interval Note:  12/19/2012 9:10 AM  Donna Howe  has presented today for surgery, with the diagnosis of abnormal stress  The various methods of treatment have been discussed with the patient and family. After consideration of risks, benefits and other options for treatment, the patient has consented to  Procedure(s): LEFT HEART CATHETERIZATION WITH CORONARY ANGIOGRAM (N/A) as a surgical intervention .  The patient's history has been reviewed, patient examined, no change in status, stable for surgery.  I have reviewed the patient's chart and labs.  Questions were answered to the patient's satisfaction.     Jenkins Rouge

## 2012-12-20 ENCOUNTER — Encounter (HOSPITAL_COMMUNITY): Payer: Self-pay | Admitting: Physician Assistant

## 2012-12-20 DIAGNOSIS — R9439 Abnormal result of other cardiovascular function study: Secondary | ICD-10-CM

## 2012-12-20 DIAGNOSIS — E119 Type 2 diabetes mellitus without complications: Secondary | ICD-10-CM

## 2012-12-20 DIAGNOSIS — I2510 Atherosclerotic heart disease of native coronary artery without angina pectoris: Secondary | ICD-10-CM

## 2012-12-20 DIAGNOSIS — F17200 Nicotine dependence, unspecified, uncomplicated: Secondary | ICD-10-CM

## 2012-12-20 DIAGNOSIS — E669 Obesity, unspecified: Secondary | ICD-10-CM

## 2012-12-20 DIAGNOSIS — N183 Chronic kidney disease, stage 3 unspecified: Secondary | ICD-10-CM | POA: Insufficient documentation

## 2012-12-20 LAB — BASIC METABOLIC PANEL
Chloride: 102 mEq/L (ref 96–112)
GFR calc Af Amer: 49 mL/min — ABNORMAL LOW (ref >90)
Potassium: 4.4 mEq/L (ref 3.5–5.1)

## 2012-12-20 LAB — CBC
HCT: 36.2 % (ref 36.0–46.0)
Platelets: 161 10*3/uL (ref 150–400)
RDW: 12.6 % (ref 11.5–15.5)
WBC: 11.5 10*3/uL — ABNORMAL HIGH (ref 4.0–10.5)

## 2012-12-20 MED ORDER — ISOSORBIDE DINITRATE 20 MG PO TABS: 20.0000 mg | ORAL_TABLET | Freq: Two times a day (BID) | ORAL | Status: DC

## 2012-12-20 MED ORDER — NITROGLYCERIN 0.4 MG SL SUBL: 0.4000 mg | SUBLINGUAL_TABLET | SUBLINGUAL | Status: DC | PRN

## 2012-12-20 MED ORDER — QUINAPRIL HCL 5 MG PO TABS: 5.0000 mg | ORAL_TABLET | Freq: Every day | ORAL | Status: DC

## 2012-12-20 MED ORDER — CLOPIDOGREL BISULFATE 75 MG PO TABS: 75.0000 mg | ORAL_TABLET | Freq: Every day | ORAL | Status: DC

## 2012-12-20 MED FILL — Sodium Chloride IV Soln 0.9%: INTRAVENOUS | Qty: 50 | Status: AC

## 2012-12-20 NOTE — Progress Notes (Signed)
CARDIAC REHAB PHASE I   PRE:  Rate/Rhythm: 75 SB  BP:  Supine:   Sitting: 115/64  Standing:    SaO2:   MODE:  Ambulation: 1000 ft   POST:  Rate/Rhythm: 67 SR  BP:  Supine:   Sitting: 135/66  Standing:    SaO2:  0800-0910 Pt tolerated ambulation well without c/o of cp or SOB. She did c/o of leg pain with walking and took one sitting rest stop. VS stable Completed discharge education with pt. She voices understanding. Pt agrees to [place]. CRP in [place], will send referral.  Rodney Langton RN 12/20/2012 9:14 AM

## 2012-12-20 NOTE — Progress Notes (Signed)
   TELEMETRY: Reviewed telemetry pt in NSR: Filed Vitals:   12/19/12 1710 12/19/12 2030 12/20/12 0050 12/20/12 0500  BP: 143/61 131/55 141/45 128/51  Pulse: 62 60 57 64  Temp: 97.7 F (36.5 C) 98.1 F (36.7 C) 98 F (36.7 C) 98.7 F (37.1 C)  TempSrc: Oral Oral Oral Oral  Resp:  19 18 18   Height:      Weight:   238 lb 5.1 oz (108.1 kg)   SpO2: 97% 98% 99% 97%    Intake/Output Summary (Last 24 hours) at 12/20/12 0727 Last data filed at 12/19/12 1900  Gross per 24 hour  Intake   1119 ml  Output   1600 ml  Net   -481 ml    SUBJECTIVE Denies any chest pain or SOB. Feels well.  LABS: Basic Metabolic Panel:  Recent Labs  12/19/12 0740 12/20/12 0503  NA 132* 133*  K 4.2 4.4  CL 100 102  CO2 22 22  GLUCOSE 273* 243*  BUN 37* 31*  CREATININE 1.35* 1.38*  CALCIUM 9.7 8.9   CBC:  Recent Labs  12/19/12 0740 12/20/12 0503  WBC 9.0 11.5*  HGB 14.2 12.6  HCT 40.0 36.2  MCV 81.6 83.4  PLT 166 161   Radiology/Studies:  No results found.  Ecg: NSR with T wave changes in lateral leads.  PHYSICAL EXAM General: Well developed, obese, in no acute distress. Head: Normal Neck: Negative for carotid bruits. JVD not elevated. Lungs: Clear bilaterally to auscultation without wheezes, rales, or rhonchi. Breathing is unlabored. Heart: RRR S1 S2 without murmurs, rubs, or gallops.  Abdomen: Soft,obese, ventral hernia. Msk:  Strength and tone appears normal for age. Extremities: No clubbing, cyanosis or edema.  Distal pedal pulses are 2+ and equal bilaterally. No groin hematoma. Neuro: Alert and oriented X 3. Moves all extremities spontaneously. Psych:  Responds to questions appropriately with a normal affect.  ASSESSMENT AND PLAN: 1. CAD with class 2 angina. High risk myoview with large inferior/inferolateral ischemia. Cath shows patent LIMA to LAD, SVG to nondominant RCA. SVG to OM occluded. Severe in stent restenosis of old Taxus stent in left main/proximal LCX. Treated  with repeat stenting of left main into LCX with DES. Needs to stay on ASA and Plavix indefinitely since she has 2 layers of DES in a vessel supplying a large territory. Would avoid elective surgery for one year. I would never stop ASA even for surgery. 2. DM on insulin 3. Hyperlipidemia. On high dose statin. 4. Ventral hernia 5. CKD stage 3- creatinine is stable post cath. 6. HTN 7. Tobacco abuse recommend smoking cessation.  Plan: discharge home today on current meds. Follow up with Dr. Ron Parker.  Principal Problem:   Abnormal nuclear stress test Active Problems:   DIABETES MELLITUS, TYPE II   HYPERLIPIDEMIA   Obesity (BMI 30-39.9)   TOBACCO ABUSE   HYPERTENSION   CORONARY ARTERY DISEASE    Signed, Peter Martinique MD,FACC 12/20/2012 7:27 AM

## 2012-12-20 NOTE — Progress Notes (Signed)
CARDIOLOGY DISCHARGE SUMMARY   Patient ID: Donna Howe MRN: [id number] DOB/AGE: [birth year] 57 y.o.  Admit date: 12/19/2012 Discharge date: 12/20/2012  Primary Discharge Diagnosis:     Intermediate coronary syndrome - 3.0 x 28 mm Promus premier stent, extending from the left main into the proximal left circumflex  Secondary Discharge Diagnosis:    DIABETES MELLITUS, TYPE II   HYPERLIPIDEMIA   Obesity (BMI 30-39.9)   TOBACCO ABUSE   HYPERTENSION   CORONARY ARTERY DISEASE  Consults: none  Procedures:  Cardiac catheterization, coronary arteriogram, SVG angiogram, LIMA arteriogram,, PTCA and stenting of the left main and circumflex, Cutting Balloon angioplasty  Hospital Course: Donna Howe is a 57 y.o. female with a history of CAD. She was seen in the office and described multiple episodes of chest pain. A stress test was ordered and was a high risk study. She was sent to [facility] for catheterization the next day.  The cardiac catheterization results are described below. The films were reviewed by Dr. Johnsie Cancel, Dr. Martinique and Dr. Ron Parker. Their thoughts: Hernia surgery is elective. No way to adequately attack in stent restenosis with BMS Will proceed with DES to LM/circumflex as this is area of ischemia on Myoview.  She had a drug-eluting stent that extended from the circumflex and the left main with cutting balloon angioplasty. She tolerated the procedure well.  Her blood pressure has been significantly elevated. It was noted she had been off her Lasix, Accupril and Isordil for at least a month prior to admission. At discharge, she will be his restarted on the Isordil at a lower dose plus the Accupril. Her Lasix can be resumed if needed as an outpatient. She has chronic kidney disease stage III. This is unchanged from labs going back more than 2 years. Her creatinine remained stable with catheterization and this will be followed as an outpatient. Her blood sugars were managed with a  combination of sliding scale insulin and her home insulin dose. She had quit smoking one week prior to admission. Continued smoking cessation was reinforced and encouraged.  On 12/20/2012, Donna Howe was seen by Dr. Martinique. Her cath site was without bruit or hematoma. She was ambulating without chest pain or shortness of breath and Dr. Martinique considered her stable for discharge, [place]  Labs:   Lab Results  Component Value Date   WBC 11.5* 12/20/2012   HGB 12.6 12/20/2012   HCT 36.2 12/20/2012   MCV 83.4 12/20/2012   PLT 161 12/20/2012     Recent Labs Lab 12/20/12 0503  NA 133*  K 4.4  CL 102  CO2 22  BUN 31*  CREATININE 1.38*  CALCIUM 8.9  GLUCOSE 243*    Recent Labs  12/19/12 0740  INR 0.92   Radiology: No results found.  Cardiac Cath: 12/19/2012 LM: 90% distal in stent restenosis  LAD: 90% ostial with patent lima distal vessel diffusely diseased  D1: 80% ostial  Circumflex: Left dominant with 90% instent restenosis of the LM stent that enters proximal circumflex. 80% lesion just distal to stent as well  OM1: 90% diffuse disease with subtotally occluded stent distally  RCA: 90% proximal non dominant with competitive flow Distal RV/RCA fills from SVG  SVG to OM : occluded SVG to RCA: patent  Galva to LAD patent  Lesion Data:  Vessel: Left main and proximal left circumflex artery  Percent stenosis (pre): 80-90%  TIMI-flow (pre): 3  Stent: 3.0 x 28 mm Promus premier stent,  extending from the left main into the proximal left circumflex Percent stenosis (post): Less than 10%  TIMI-flow (post): 3  EKG:  20-Dec-2012 05:12:47  Normal sinus rhythm Normal ECG Since last tracing rate faster 39mm/s 58mm/mV 100Hz  8.0.1 12SL 241 HD CID: 1 Referred by: Jenkins Rouge Confirmed By: Wallene Huh MD Vent. rate 64 BPM PR interval 188 Donna QRS duration 94 Donna QT/QTc 448/462 Donna P-R-T axes 61 64 98  FOLLOW UP PLANS AND APPOINTMENTS Allergies  Allergen Reactions  .  Penicillins     REACTION: Unknown, told as a child     Medication List    TAKE these medications       aspirin 81 MG tablet  Take 81 mg by mouth daily.     atorvastatin 80 MG tablet  Commonly known as:  LIPITOR  Take 80 mg by mouth daily.     clopidogrel 75 MG tablet  Commonly known as:  PLAVIX  Take 1 tablet (75 mg total) by mouth daily.     glucose blood test strip  Commonly known as:  ONE TOUCH ULTRA TEST  1 each by Other route 2 (two) times daily. And lancets 2/day 250.43     insulin detemir 100 UNIT/ML injection  Commonly known as:  LEVEMIR  Inject 100 Units into the skin every morning. And pen needles 1/day     Insulin Pen Needle 31G X 8 MM Misc  Use as directed three times a day     isosorbide dinitrate 20 MG tablet  Commonly known as:  ISORDIL  Take 1 tablet (20 mg total) by mouth 2 (two) times daily. 3 tablets by mouth two times daily     metoprolol tartrate 25 MG tablet  Commonly known as:  LOPRESSOR  Take 25 mg by mouth 2 (two) times daily.     nitroGLYCERIN 0.4 MG SL tablet  Commonly known as:  NITROSTAT  Place 1 tablet (0.4 mg total) under the tongue every 5 (five) minutes as needed. Not to exceed 3 in 15 minute time frame     ONETOUCH DELICA LANCETS Misc     quinapril 5 MG tablet  Commonly known as:  ACCUPRIL  Take 1 tablet (5 mg total) by mouth daily.        Discharge Orders   Future Appointments Provider Department Dept Phone   01/03/2013 1:00 PM Aurora Mask, PA-C [place] (near [place]) [phone]   01/21/2013 10:15 AM Renato Shin, MD [facility] PRIMARY [facility] ENDOCRINOLOGY [phone]   Future Orders Complete By Expires     Diet - low sodium heart healthy  As directed     Diet Carb Modified  As directed     Increase activity slowly  As directed       Follow-up Information   Follow up with SERPE, EUGENE, PA-C On 01/03/2013. (At 1:00 pm)    Contact information:   [address], [place] [address] [phone]        BRING ALL MEDICATIONS WITH YOU TO FOLLOW UP APPOINTMENTS  Time spent with patient to include physician time: 36 min Signed: Rosaria Ferries, PA-C 12/20/2012, 9:07 AM Co-Sign MD

## 2012-12-20 NOTE — Progress Notes (Signed)
Patient seen and examined and history reviewed. Agree with above findings and plan. See my earlier rounding note.  [name] [facility] 12/20/2012 11:00 AM

## 2012-12-23 ENCOUNTER — Telehealth: Payer: Self-pay

## 2012-12-23 NOTE — Telephone Encounter (Signed)
Pt states she is doing well since her hospital d/c and that she has all of her medications. She states she is aware of eph f/u with Gene Serpe, PA-C on 4/18. Pt given office phone number to call if she has any questions or concerns, she verbalized understanding.

## 2013-01-03 ENCOUNTER — Ambulatory Visit (INDEPENDENT_AMBULATORY_CARE_PROVIDER_SITE_OTHER): Payer: Medicare Other | Admitting: Physician Assistant

## 2013-01-03 ENCOUNTER — Encounter: Payer: Self-pay | Admitting: Physician Assistant

## 2013-01-03 VITALS — BP 123/61 | HR 52 | Ht 64.0 in | Wt 221.1 lb

## 2013-01-03 DIAGNOSIS — I498 Other specified cardiac arrhythmias: Secondary | ICD-10-CM

## 2013-01-03 DIAGNOSIS — I2510 Atherosclerotic heart disease of native coronary artery without angina pectoris: Secondary | ICD-10-CM

## 2013-01-03 DIAGNOSIS — R001 Bradycardia, unspecified: Secondary | ICD-10-CM | POA: Insufficient documentation

## 2013-01-03 DIAGNOSIS — F17200 Nicotine dependence, unspecified, uncomplicated: Secondary | ICD-10-CM

## 2013-01-03 DIAGNOSIS — R0989 Other specified symptoms and signs involving the circulatory and respiratory systems: Secondary | ICD-10-CM

## 2013-01-03 DIAGNOSIS — E785 Hyperlipidemia, unspecified: Secondary | ICD-10-CM

## 2013-01-03 MED ORDER — METOPROLOL TARTRATE 25 MG PO TABS: 12.5000 mg | ORAL_TABLET | Freq: Two times a day (BID) | ORAL | Status: DC

## 2013-01-03 NOTE — Assessment & Plan Note (Signed)
Stable on current medication regimen, which includes DAPT with Plavix. Given the severity of her recent ISR, I recommend that she remain on Plavix indefinitely, in conjunction with low-dose ASA. Of note, she was advised to defer recommended hernia repair for at least one year. I would suggest that she can then come off her Plavix for 5 days prior to surgery, but then resume this postoperatively, once cleared to do so.

## 2013-01-03 NOTE — Assessment & Plan Note (Signed)
Carotid Dopplers have been scheduled

## 2013-01-03 NOTE — Patient Instructions (Signed)
   Labs for fasting lipid and liver panel in 12 weeks (around April 04, 2013)  Office will notify of results  If have done with PMD, please ask them to send copy  Decrease Lopressor to 12.5mg  twice a day   Continue all other current medications. Follow up in  3 months

## 2013-01-03 NOTE — Progress Notes (Signed)
Primary Cardiologist: Daryel November, MD   HPI: Post hospital followup from [facility], status post successful PTCA/drug-eluting stenting of the LMCA and CFX, secondary to severe ISR up to 80-90%. Coronary anatomy, as outlined:   - Cardiac catheterization, April 3: 90% distal ISR of LMCA; 90% ostial LAD with patent L.-LAD; 80% ostial DX 1; 90% proximal ISR of CFX, with 80% stenosis, distal to stent; 90% diffuse OM1 disease, subtotally occluded stent, distally; 90% proximal RCA (nondominant) with distal collateralization from SVG; 100% occluded SVG-OM; patent SVG-RCA; EF 60%, no focal WMAs  Clinically, she notes overall improvement in her exercise tolerance . Nevertheless, she continues to have occasional CP, not strictly correlated to exertion, but with less frequency than in recent past. She has not had to use any NTG. She denies any complications of R groin incision site.  She does complain of feeling "tired" and occasionally dizzy, particularly upon standing. She denies frank syncope.  Allergies  Allergen Reactions  . Penicillins     REACTION: Unknown, told as a child    Current Outpatient Prescriptions  Medication Sig Dispense Refill  . aspirin 81 MG tablet Take 81 mg by mouth daily.      Marland Kitchen atorvastatin (LIPITOR) 80 MG tablet Take 80 mg by mouth daily.        . clopidogrel (PLAVIX) 75 MG tablet Take 1 tablet (75 mg total) by mouth daily.  30 tablet  11  . glucose blood (ONE TOUCH ULTRA TEST) test strip 1 each by Other route 2 (two) times daily. And lancets 2/day 250.43  100 each  12  . insulin detemir (LEVEMIR) 100 UNIT/ML injection Inject 100 Units into the skin every morning. And pen needles 1/day      . Insulin Pen Needle 31G X 8 MM MISC Use as directed three times a day       . isosorbide dinitrate (ISORDIL) 20 MG tablet Take 1 tablet (20 mg total) by mouth 2 (two) times daily.  60 tablet  11  . metoprolol tartrate (LOPRESSOR) 25 MG tablet Take 0.5 tablets (12.5 mg total) by mouth 2 (two) times  daily.      . nitroGLYCERIN (NITROSTAT) 0.4 MG SL tablet Place 1 tablet (0.4 mg total) under the tongue every 5 (five) minutes as needed. Not to exceed 3 in 15 minute time frame  25 tablet  3  . ONETOUCH DELICA LANCETS MISC       . quinapril (ACCUPRIL) 5 MG tablet Take 1 tablet (5 mg total) by mouth daily.  30 tablet  11   No current facility-administered medications for this visit.   Facility-Administered Medications Ordered in Other Visits  Medication Dose Route Frequency Provider Last Rate Last Dose  . nitroGLYCERIN (NITROSTAT) SL tablet 0.4 mg  0.4 mg Sublingual Q5 Min x 3 PRN Aurora Mask, PA-C        Past Medical History  Diagnosis Date  . Hypercholesteremia   . Hypertension   . Diabetes mellitus   . DIABETES MELLITUS, TYPE II 03/31/2007  . HYPERLIPIDEMIA 03/31/2007  . Overweight 08/26/2009  . HYPERTENSION 03/31/2007  . CORONARY ARTERY DISEASE 03/31/2007  . ASTHMA 08/26/2009  . RENAL FAILURE 03/31/2007  . Gout   . SBO (small bowel obstruction) 2011    lap lysis of adhesions & hernia repair  . Shortness of breath   . Arthritis   . Chronic kidney disease (CKD), stage III (moderate)     Past Surgical History  Procedure Laterality Date  . Cholecystectomy  2010  .  Hernia repair  1989    open w mesh  . Cesarean section  1984  . Cardiac catheterization  07/18/2004  . Coronary artery bypass graft  2005  . Cardiac stents  2006-2007  . Incisional hernia repair x2  03/04/2010     laparoscopic with 35cm mesh by Dr Ronnald Collum  . Shoulder surgery    . Coronary angioplasty with stent placement  12/19/2012    [facility]    DES    History   Social History  . Marital Status: Divorced    Spouse Name: N/A    Number of Children: N/A  . Years of Education: N/A   Occupational History  . Disabled    Social History Main Topics  . Smoking status: Former Smoker -- 1.00 packs/day for 20 years    Types: Cigarettes    Quit date: 12/10/2012  . Smokeless tobacco: Never Used  . Alcohol  Use: No  . Drug Use: No  . Sexually Active: Not on file   Other Topics Concern  . 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 Diabetes Mother   . Heart disease Mother   . Hyperlipidemia Mother   . Thyroid disease Father     ROS: no nausea, vomiting; no fever, chills; no melena, hematochezia; no claudication  PHYSICAL EXAM: BP 123/61  Pulse 52  Ht 5\' 4"  (1.626 m)  Wt 221 lb 1.9 oz (100.299 kg)  BMI 37.94 kg/m2  SpO2 98% GENERAL: 57 year-old female, obese; NAD  HEENT: NCAT, PERRLA, EOMI; sclera clear; left eyelid xanthelasma  NECK: bilateral carotid bruits (R > L); no JVD; no TM  LUNGS: CTA bilaterally  CARDIAC: RRR (S1, S2); soft, grade 2/6 systolic ejection murmur at base; no rubs or gallops  ABDOMEN: soft, protuberant; no abdominal bruits  EXTREMETIES: no significant peripheral edema  SKIN: warm/dry; no obvious rash/lesions  MUSCULOSKELETAL: no joint deformity  NEURO: no focal deficit; NL affe    EKG:    ASSESSMENT & PLAN:  CORONARY ARTERY DISEASE Stable on current medication regimen, which includes DAPT with Plavix. Given the severity of her recent ISR, I recommend that she remain on Plavix indefinitely, in conjunction with low-dose ASA. Of note, she was advised to defer recommended hernia repair for at least one year. I would suggest that she can then come off her Plavix for 5 days prior to surgery, but then resume this postoperatively, once cleared to do so.  Bradycardia Patient presents with a resting heart rate in the low 50 bpm range, and reports occasional dizziness. Will decrease Lopressor to 12.5 twice a day, and continue to monitor clinical status closely.  TOBACCO ABUSE Patient has not resumed smoking tobacco, since last OV.  HYPERLIPIDEMIA Continue high dose statin and reassess lipid status in 12 weeks. Aggressive management recommended with target LDL  70 or less, if feasible.  Carotid bruit Carotid Dopplers have been scheduled    Gene Greer Wainright, PAC

## 2013-01-03 NOTE — Assessment & Plan Note (Signed)
Continue high dose statin and reassess lipid status in 12 weeks. Aggressive management recommended with target LDL 70 or less, if feasible.

## 2013-01-03 NOTE — Assessment & Plan Note (Signed)
Patient presents with a resting heart rate in the low 50 bpm range, and reports occasional dizziness. Will decrease Lopressor to 12.5 twice a day, and continue to monitor clinical status closely.

## 2013-01-03 NOTE — Assessment & Plan Note (Signed)
Patient has not resumed smoking tobacco, since last OV.

## 2013-01-07 ENCOUNTER — Other Ambulatory Visit: Payer: Self-pay | Admitting: *Deleted

## 2013-01-07 MED ORDER — ONETOUCH DELICA LANCETS 33G MISC: 1.0000 | Freq: Two times a day (BID) | Status: DC

## 2013-01-08 ENCOUNTER — Telehealth: Payer: Self-pay | Admitting: Cardiology

## 2013-01-08 NOTE — Telephone Encounter (Signed)
Called patient but no answer and was unable to leave message.

## 2013-01-08 NOTE — Telephone Encounter (Signed)
Legs weak and wen she stands she feels like she will give out.. Feel weak over all not feeling normal. Has been going on for several days

## 2013-01-09 NOTE — Telephone Encounter (Signed)
DC Lopressor

## 2013-01-09 NOTE — Telephone Encounter (Signed)
Patient still continue to c/o of being dizzy, and legs feeling weak. Patient recently had medication changes with metoprolol and still no improvement. Please advise.

## 2013-01-10 NOTE — Telephone Encounter (Signed)
Patient informed. 

## 2013-01-21 ENCOUNTER — Ambulatory Visit (INDEPENDENT_AMBULATORY_CARE_PROVIDER_SITE_OTHER): Payer: Medicare Other | Admitting: Endocrinology

## 2013-01-21 ENCOUNTER — Encounter: Payer: Self-pay | Admitting: Endocrinology

## 2013-01-21 VITALS — BP 138/80 | HR 74 | Wt 220.0 lb

## 2013-01-21 DIAGNOSIS — E1129 Type 2 diabetes mellitus with other diabetic kidney complication: Secondary | ICD-10-CM | POA: Insufficient documentation

## 2013-01-21 DIAGNOSIS — E119 Type 2 diabetes mellitus without complications: Secondary | ICD-10-CM

## 2013-01-21 MED ORDER — INSULIN DETEMIR 100 UNIT/ML FLEXPEN: 125.0000 [IU] | PEN_INJECTOR | SUBCUTANEOUS | Status: DC

## 2013-01-21 NOTE — Patient Instructions (Addendum)
Please increase levemir to 125 units each morning.  check your blood sugar 2 times a day.  vary the time of day when you check, between before the 3 meals, and at bedtime.  also check if you have symptoms of your blood sugar being too high or too low.  please keep a record of the readings and bring it to your next appointment here.  please call us sooner if you are having low blood sugar episodes.   Please make a follow-up appointment in 3 months.

## 2013-01-21 NOTE — Progress Notes (Signed)
Subjective:    Patient ID: Donna Howe, female    DOB: [birth year], 57 y.o.   MRN: [id number]  HPI Pt returns for f/u of insulin-requiring DM (dx'ed 123XX123; complicated by renal failure, PAD, and CAD; compliance has been much better with a simpler qd insulin schedule; last episode of severe hypoglycemia was approx 2011).  no cbg record, but states cbg's vary from 140-300.  There is no trend throughout the day. Past Medical History  Diagnosis Date  . Hypercholesteremia   . Hypertension   . Diabetes mellitus   . DIABETES MELLITUS, TYPE II 03/31/2007  . HYPERLIPIDEMIA 03/31/2007  . Overweight 08/26/2009  . HYPERTENSION 03/31/2007  . CORONARY ARTERY DISEASE 03/31/2007  . ASTHMA 08/26/2009  . RENAL FAILURE 03/31/2007  . Gout   . SBO (small bowel obstruction) 2011    lap lysis of adhesions & hernia repair  . Shortness of breath   . Arthritis   . Chronic kidney disease (CKD), stage III (moderate)     Past Surgical History  Procedure Laterality Date  . Cholecystectomy  2010  . Hernia repair  1989    open w mesh  . Cesarean section  1984  . Cardiac catheterization  07/18/2004  . Coronary artery bypass graft  2005  . Cardiac stents  2006-2007  . Incisional hernia repair x2  03/04/2010     laparoscopic with 35cm mesh by Dr Ronnald Collum  . Shoulder surgery    . Coronary angioplasty with stent placement  12/19/2012    [facility]    DES    History   Social History  . Marital Status: Divorced    Spouse Name: N/A    Number of Children: N/A  . Years of Education: N/A   Occupational History  . Disabled    Social History Main Topics  . Smoking status: Former Smoker -- 1.00 packs/day for 20 years    Types: Cigarettes    Quit date: 12/10/2012  . Smokeless tobacco: Never Used  . Alcohol Use: No  . Drug Use: No  . Sexually Active: Not on file   Other Topics Concern  . Not on file   Social History Narrative   Takes care of a boyfriend whom ia also disabled    Current Outpatient  Prescriptions on File Prior to Visit  Medication Sig Dispense Refill  . aspirin 81 MG tablet Take 81 mg by mouth daily.      Marland Kitchen atorvastatin (LIPITOR) 80 MG tablet Take 80 mg by mouth daily.        . clopidogrel (PLAVIX) 75 MG tablet Take 1 tablet (75 mg total) by mouth daily.  30 tablet  11  . glucose blood (ONE TOUCH ULTRA TEST) test strip 1 each by Other route 2 (two) times daily. And lancets 2/day 250.43  100 each  12  . Insulin Pen Needle 31G X 8 MM MISC Use as directed three times a day       . isosorbide dinitrate (ISORDIL) 20 MG tablet Take 1 tablet (20 mg total) by mouth 2 (two) times daily.  60 tablet  11  . metoprolol tartrate (LOPRESSOR) 25 MG tablet Take 0.5 tablets (12.5 mg total) by mouth 2 (two) times daily.  30 tablet  6  . nitroGLYCERIN (NITROSTAT) 0.4 MG SL tablet Place 1 tablet (0.4 mg total) under the tongue every 5 (five) minutes as needed. Not to exceed 3 in 15 minute time frame  25 tablet  3  . [facility] DELICA  LANCETS 33G MISC 1 each by Does not apply route 2 (two) times daily.  100 each  3  . ONETOUCH DELICA LANCETS MISC       . quinapril (ACCUPRIL) 5 MG tablet Take 1 tablet (5 mg total) by mouth daily.  30 tablet  11   Current Facility-Administered Medications on File Prior to Visit  Medication Dose Route Frequency Provider Last Rate Last Dose  . nitroGLYCERIN (NITROSTAT) SL tablet 0.4 mg  0.4 mg Sublingual Q5 Min x 3 PRN Aurora Mask, PA-C        Allergies  Allergen Reactions  . Penicillins     REACTION: Unknown, told as a child    Family History  Problem Relation Age of Onset  . Diabetes Mother   . Heart disease Mother   . Hyperlipidemia Mother   . Thyroid disease Father     BP 138/80  Pulse 74  Wt 220 lb (99.791 kg)  BMI 37.74 kg/m2  SpO2 98%  Review of Systems denies hypoglycemia    Objective:   Physical Exam VITAL SIGNS:  See vs page GENERAL: no distress SKIN:  Insulin injection sites at the anterior abdomen are normal.     outside test  results are reviewed: [facility]: a1c was 9% last week    Assessment & Plan:  DM: she needs increased rx

## 2013-01-30 ENCOUNTER — Encounter: Payer: Self-pay | Admitting: Endocrinology

## 2013-02-11 ENCOUNTER — Ambulatory Visit (INDEPENDENT_AMBULATORY_CARE_PROVIDER_SITE_OTHER): Payer: Medicare Other | Admitting: Vascular Surgery

## 2013-02-11 ENCOUNTER — Encounter: Payer: Self-pay | Admitting: Vascular Surgery

## 2013-02-11 VITALS — BP 146/74 | HR 98 | Resp 18 | Ht 66.0 in | Wt 222.0 lb

## 2013-02-11 DIAGNOSIS — M79604 Pain in right leg: Secondary | ICD-10-CM

## 2013-02-11 DIAGNOSIS — M79609 Pain in unspecified limb: Secondary | ICD-10-CM

## 2013-02-11 DIAGNOSIS — I70219 Atherosclerosis of native arteries of extremities with intermittent claudication, unspecified extremity: Secondary | ICD-10-CM

## 2013-02-11 DIAGNOSIS — M79605 Pain in left leg: Secondary | ICD-10-CM

## 2013-02-11 NOTE — Progress Notes (Signed)
Subjective:     Patient ID: Donna Howe, female   DOB: Jan 10, 1956, 57 y.o.   MRN: [id number]  HPI this 57 year old female is referred by Dr. Rory Percy for claudication symptoms in the lower extremities. This patient with type 1 diabetes mellitus has previously undergone coronary artery bypass grafting by Dr. Caffie Pinto and has required stenting on at least 2 occasions since that time. She has a heavy history of tobacco abuse but has not smoked since March of this year. She also has a strong family history of coronary artery disease. She currently denies rest pain or history of nonhealing ulcers or infection but does have severe calf tightness worse on the left after walking about one half block. She must stop and rest and then resume walking in 3-5 minutes for [place] distances. Is not limiting her severely at the present time.  Past Medical History  Diagnosis Date  . Hypercholesteremia   . Hypertension   . Diabetes mellitus   . DIABETES MELLITUS, TYPE II 03/31/2007  . HYPERLIPIDEMIA 03/31/2007  . Overweight 08/26/2009  . HYPERTENSION 03/31/2007  . CORONARY ARTERY DISEASE 03/31/2007  . ASTHMA 08/26/2009  . RENAL FAILURE 03/31/2007  . Gout   . SBO (small bowel obstruction) 2011    lap lysis of adhesions & hernia repair  . Shortness of breath   . Arthritis   . Chronic kidney disease (CKD), stage III (moderate)     History  Substance Use Topics  . Smoking status: Former Smoker -- 1.00 packs/day for 20 years    Types: Cigarettes    Quit date: 12/10/2012  . Smokeless tobacco: Never Used  . Alcohol Use: No    Family History  Problem Relation Age of Onset  . Diabetes Mother   . Heart disease Mother   . Hyperlipidemia Mother   . Hypertension Mother   . Thyroid disease Father   . Hypertension Father   . AAA (abdominal aortic aneurysm) Father   . Heart disease Brother   . Hyperlipidemia Son   . Hypertension Son     Allergies  Allergen Reactions  . Penicillins     REACTION:  Unknown, told as a child    Current outpatient prescriptions:aspirin 81 MG tablet, Take 81 mg by mouth daily., Disp: , Rfl: ;  atorvastatin (LIPITOR) 80 MG tablet, Take 80 mg by mouth daily.  , Disp: , Rfl: ;  clopidogrel (PLAVIX) 75 MG tablet, Take 1 tablet (75 mg total) by mouth daily., Disp: 30 tablet, Rfl: 11;  colchicine 0.6 MG tablet, Take 0.6 mg by mouth as needed., Disp: , Rfl:  glucose blood (ONE TOUCH ULTRA TEST) test strip, 1 each by Other route 2 (two) times daily. And lancets 2/day 250.43, Disp: 100 each, Rfl: 12;  Insulin Pen Needle 31G X 8 MM MISC, Use as directed three times a day , Disp: , Rfl: ;  isosorbide dinitrate (ISORDIL) 20 MG tablet, Take 1 tablet (20 mg total) by mouth 2 (two) times daily., Disp: 60 tablet, Rfl: 11 nitroGLYCERIN (NITROSTAT) 0.4 MG SL tablet, Place 1 tablet (0.4 mg total) under the tongue every 5 (five) minutes as needed. Not to exceed 3 in 15 minute time frame, Disp: 25 tablet, Rfl: 3;  ONETOUCH DELICA LANCETS 99991111 MISC, 1 each by Does not apply route 2 (two) times daily., Disp: 100 each, Rfl: 3;  ONETOUCH DELICA LANCETS MISC, , Disp: , Rfl:  oxyCODONE-acetaminophen (PERCOCET) 10-650 MG per tablet, Take 1 tablet by mouth every 8 (eight) hours as  needed for pain., Disp: , Rfl: ;  quinapril (ACCUPRIL) 5 MG tablet, Take 1 tablet (5 mg total) by mouth daily., Disp: 30 tablet, Rfl: 11;  Insulin Detemir (LEVEMIR FLEXPEN) 100 UNIT/ML SOPN, Inject 125 Units into the skin every morning. And pen needles 1/day, Disp: 15 pen, Rfl: 11 metoprolol tartrate (LOPRESSOR) 25 MG tablet, Take 0.5 tablets (12.5 mg total) by mouth 2 (two) times daily., Disp: 30 tablet, Rfl: 6 No current facility-administered medications for this visit. Facility-Administered Medications Ordered in Other Visits: nitroGLYCERIN (NITROSTAT) SL tablet 0.4 mg, 0.4 mg, Sublingual, Q5 Min x 3 PRN, Aurora Mask, PA-C  BP 146/74  Pulse 98  Resp 18  Ht 5\' 6"  (1.676 m)  Wt 222 lb (100.699 kg)  BMI 35.85  kg/m2  Body mass index is 35.85 kg/(m^2).          Review of Systems has chronic chest pain and dyspnea on exertion. Also complains of weakness in her arms and legs and dizziness and severe obesity. Needs hernia repair but must lose weight prior to that. Other systems negative and complete review of systems    Objective:   Physical Exam pressure 146/74 heart rate 98 respirations 18 Gen.-alert and oriented x3 in no apparent distress-morbidly obese  HEENT normal for age Lungs no rhonchi or wheezing Cardiovascular regular rhythm no murmurs carotid pulses 3+ palpable no bruits audible Abdomen soft nontender no palpable masses Musculoskeletal free of  major deformities Skin clear -no rashes Neurologic normal Lower extremities 3+ femoral and dorsalis pedis pulse palpable in right leg. Left leg with 3+ femoral absent popliteal and distal pulses.-Both feet adequately perfused-no evidence of gangrene or infection-intact motion and sensation Right great saphenous vein has been removed for coronary artery bypass grafting from the ET saphenofemoral junction  I reviewed the report from [facility]. ABI on the right is 0.94 and on the left is 0.47     Assessment:     Probable left superficial femoral occlusion with limiting claudication left leg-ABI 0.47 secondary to diabetes mellitus, history of tobacco abuse, strong family history of vascular occlusive disease Obesity Coronary artery disease previous coronary artery bypass grafting and subsequent stenting on at least 2 occasions    Plan:     Patient is not an operative candidate because of her obesity with large panniculus in the inguinal area, diabetes mellitus, history of tobacco abuse, recurrent atherosclerosis and coronary arteries This is not limb threatening at the present time and I would recommend an exercise program, she develops rest pain, nonhealing ulcers, gangrene, or infection in the left foot Right lower extremity does  not need any further evaluation If symptoms worsen patient will be in touch with [place] for further evaluation. She would be a candidate for femoral-popliteal bypass grafting if indicated

## 2013-02-14 ENCOUNTER — Encounter: Payer: Self-pay | Admitting: Family Medicine

## 2013-02-15 ENCOUNTER — Encounter: Payer: Self-pay | Admitting: Endocrinology

## 2013-02-17 MED ORDER — INSULIN DETEMIR 100 UNIT/ML FLEXPEN: 175.0000 [IU] | PEN_INJECTOR | SUBCUTANEOUS | Status: DC

## 2013-02-18 ENCOUNTER — Encounter: Payer: Self-pay | Admitting: Endocrinology

## 2013-02-19 ENCOUNTER — Other Ambulatory Visit: Payer: Self-pay | Admitting: Endocrinology

## 2013-02-19 MED ORDER — INSULIN NPH (HUMAN) (ISOPHANE) 100 UNIT/ML ~~LOC~~ SUSP: 150.0000 [IU] | Freq: Every day | SUBCUTANEOUS | Status: DC

## 2013-02-21 ENCOUNTER — Telehealth: Payer: Self-pay | Admitting: Endocrinology

## 2013-02-21 NOTE — Telephone Encounter (Signed)
Call Pharmacy regarding Humalin N script, it is 200.00+. Want to know if they can change to Novalin N - it is available in a cheaper generic / Sherri

## 2013-02-21 NOTE — Telephone Encounter (Signed)
Pharmacist advised rx sent to pharmacy

## 2013-03-04 ENCOUNTER — Encounter: Payer: Medicare Other | Admitting: Vascular Surgery

## 2013-03-30 ENCOUNTER — Encounter: Payer: Self-pay | Admitting: Cardiology

## 2013-03-30 DIAGNOSIS — IMO0002 Reserved for concepts with insufficient information to code with codable children: Secondary | ICD-10-CM | POA: Insufficient documentation

## 2013-03-30 DIAGNOSIS — R9430 Abnormal result of cardiovascular function study, unspecified: Secondary | ICD-10-CM | POA: Insufficient documentation

## 2013-03-30 DIAGNOSIS — Z951 Presence of aortocoronary bypass graft: Secondary | ICD-10-CM | POA: Insufficient documentation

## 2013-03-31 ENCOUNTER — Encounter: Payer: Self-pay | Admitting: Cardiology

## 2013-03-31 ENCOUNTER — Ambulatory Visit (INDEPENDENT_AMBULATORY_CARE_PROVIDER_SITE_OTHER): Payer: Medicare Other | Admitting: Cardiology

## 2013-03-31 VITALS — BP 147/85 | HR 65 | Ht 65.5 in | Wt 234.0 lb

## 2013-03-31 DIAGNOSIS — I70219 Atherosclerosis of native arteries of extremities with intermittent claudication, unspecified extremity: Secondary | ICD-10-CM

## 2013-03-31 DIAGNOSIS — R0989 Other specified symptoms and signs involving the circulatory and respiratory systems: Secondary | ICD-10-CM

## 2013-03-31 DIAGNOSIS — I2510 Atherosclerotic heart disease of native coronary artery without angina pectoris: Secondary | ICD-10-CM

## 2013-03-31 NOTE — Patient Instructions (Addendum)
Your physician recommends that you schedule a follow-up appointment in: 6 months. You will receive a reminder letter in the mail in about 4 months reminding you to call and schedule your appointment. If you don't receive this letter, please contact our office. Your physician recommends that you continue on your current medications as directed. Please refer to the Current Medication list given to you today. Your physician has requested that you have a carotid duplex. This test is an ultrasound of the carotid arteries in your neck. It looks at blood flow through these arteries that supply the brain with blood. Allow one hour for this exam. There are no restrictions or special instructions.

## 2013-03-31 NOTE — Assessment & Plan Note (Signed)
The patient has had a carotid bruit heard in the past. We are concerned about her symptoms and she needs a carotid Doppler. This will be arranged.  At this point the etiology of her intermittent leg weakness and hand tingling is not clear.

## 2013-03-31 NOTE — Assessment & Plan Note (Signed)
Coronary disease is stable. She received a drug-eluting stent to the left main and circumflex for in-stent restenosis in April, 2014. She is stable.

## 2013-03-31 NOTE — Progress Notes (Signed)
HPI  Patient is seen today to followup coronary disease and peripheral vascular disease. She had an intervention earlier this year. Since that time she has seen Dr. Kellie Simmering for vascular evaluation. He feels that she probably has left SFA occlusion. However she has multiple comorbidities and he feels that surgical intervention is not appropriate at this time. Her problem is not limb threatening. If she has worsening disease surgery could be considered.  She's not having any significant chest pain or shortness of breath.  She mentions to me that sometimes when standing up she feels an unusual sensation in her left leg. She does not have dizziness. Rarely she might feel that her legs are going to buckle under her. She has not had syncope. Also she intermittently feels some numbness in her hands.  In March, 2014 we'll plan to proceed with a carotid Doppler. This was not done because she had other cardiac evaluation.  Allergies  Allergen Reactions  . Penicillins     REACTION: Unknown, told as a child    Current Outpatient Prescriptions  Medication Sig Dispense Refill  . aspirin 81 MG tablet Take 81 mg by mouth daily.      Marland Kitchen atorvastatin (LIPITOR) 80 MG tablet Take 80 mg by mouth daily.        . clopidogrel (PLAVIX) 75 MG tablet Take 1 tablet (75 mg total) by mouth daily.  30 tablet  11  . colchicine 0.6 MG tablet Take 0.6 mg by mouth as needed.      . furosemide (LASIX) 40 MG tablet Take 40 mg by mouth daily.       Marland Kitchen glucose blood (ONE TOUCH ULTRA TEST) test strip 1 each by Other route 2 (two) times daily. And lancets 2/day 250.43  100 each  12  . insulin NPH (HUMULIN N,NOVOLIN N) 100 UNIT/ML injection Inject 150 Units into the skin daily before breakfast.      . Insulin Pen Needle 31G X 8 MM MISC Use as directed three times a day       . isosorbide dinitrate (ISORDIL) 20 MG tablet Take 20 mg by mouth daily.      . nitroGLYCERIN (NITROSTAT) 0.4 MG SL tablet Place 1 tablet (0.4 mg total) under  the tongue every 5 (five) minutes as needed. Not to exceed 3 in 15 minute time frame  25 tablet  3  . oxyCODONE-acetaminophen (PERCOCET) 10-650 MG per tablet Take 1 tablet by mouth every 8 (eight) hours as needed for pain.      Marland Kitchen quinapril (ACCUPRIL) 5 MG tablet Take 1 tablet (5 mg total) by mouth daily.  30 tablet  11   No current facility-administered medications for this visit.   Facility-Administered Medications Ordered in Other Visits  Medication Dose Route Frequency Provider Last Rate Last Dose  . nitroGLYCERIN (NITROSTAT) SL tablet 0.4 mg  0.4 mg Sublingual Q5 Min x 3 PRN Aurora Mask, PA-C        History   Social History  . Marital Status: Divorced    Spouse Name: N/A    Number of Children: N/A  . Years of Education: N/A   Occupational History  . Disabled    Social History Main Topics  . Smoking status: Former Smoker -- 1.00 packs/day for 20 years    Types: Cigarettes    Quit date: 12/10/2012  . Smokeless tobacco: Never Used  . Alcohol Use: No  . Drug Use: No  . Sexually Active: Not on file   Other  Topics Concern  . 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 Diabetes Mother   . Heart disease Mother   . Hyperlipidemia Mother   . Hypertension Mother   . Thyroid disease Father   . Hypertension Father   . AAA (abdominal aortic aneurysm) Father   . Heart disease Brother   . Hyperlipidemia Son   . Hypertension Son     Past Medical History  Diagnosis Date  . Hypercholesteremia   . Hypertension   . Diabetes mellitus   . DIABETES MELLITUS, TYPE II 03/31/2007  . HYPERLIPIDEMIA 03/31/2007  . Overweight(278.02) 08/26/2009  . HYPERTENSION 03/31/2007  . CAD (coronary artery disease) 03/31/2007  . ASTHMA 08/26/2009  . RENAL FAILURE 03/31/2007  . Gout   . SBO (small bowel obstruction) 2011    lap lysis of adhesions & hernia repair  . Shortness of breath   . Arthritis    . Chronic kidney disease (CKD), stage III (moderate)   . Hx of CABG     2005  . PAD (peripheral artery disease)     Dr. Kellie Simmering  . Ejection fraction     Normal EF echo 2010  //   EF 60%, catheterization, April, 2014    Past Surgical History  Procedure Laterality Date  . Cholecystectomy  2010  . Hernia repair  1989    open w mesh  . Cesarean section  1984  . Cardiac catheterization  07/18/2004  . Coronary artery bypass graft  2005  . Cardiac stents  2006-2007  . Incisional hernia repair x2  03/04/2010     laparoscopic with 35cm mesh by Dr Ronnald Collum  . Shoulder surgery    . Coronary angioplasty with stent placement  12/19/2012    [facility]    DES    Patient Active Problem List   Diagnosis Date Noted  . Hx of CABG   . Ejection fraction   . Atherosclerosis of native arteries of the extremities with intermittent claudication 02/11/2013  . Diabetes with renal manifestations(250.4) 01/21/2013  . Bradycardia 01/03/2013  . Chronic kidney disease (CKD), stage III (moderate)   . Carotid bruit 12/11/2012  . Gout   . PROTEINURIA, MILD 01/18/2010  . TOBACCO ABUSE 08/27/2009  . Obesity (BMI 30-39.9) 08/26/2009  . ASTHMA 08/26/2009  . HYPERLIPIDEMIA 03/31/2007  . HYPERTENSION 03/31/2007  . RENAL FAILURE 03/31/2007  . CAD (coronary artery disease) 03/31/2007    ROS   Patient denies fever, chills, headache, sweats, rash, change in vision, change in hearing, chest pain, cough, nausea vomiting, urinary symptoms. All other systems are reviewed and are negative.  PHYSICAL EXAM   Patient is overweight. She is oriented to person time and place. Affect is normal. There is no jugulovenous distention. Lungs are clear. Respiratory effort is nonlabored. Cardiac exam reveals S1 and S2. There no clicks or significant murmurs. The abdomen is soft. There is no peripheral edema. Filed Vitals:   03/31/13 1309  BP: 147/85  Pulse: 65  Height: 5' 5.5" (1.664 m)  Weight: 234 lb (106.142 kg)      ASSESSMENT & PLAN

## 2013-03-31 NOTE — Assessment & Plan Note (Signed)
This patient has significant vascular disease of the legs. I have outlined the results from Dr. Kellie Simmering above. Medical therapy is recommended at this time.

## 2013-04-03 ENCOUNTER — Encounter (INDEPENDENT_AMBULATORY_CARE_PROVIDER_SITE_OTHER): Payer: Medicare Other

## 2013-04-03 DIAGNOSIS — I6529 Occlusion and stenosis of unspecified carotid artery: Secondary | ICD-10-CM

## 2013-04-03 DIAGNOSIS — I70219 Atherosclerosis of native arteries of extremities with intermittent claudication, unspecified extremity: Secondary | ICD-10-CM

## 2013-04-03 DIAGNOSIS — R0989 Other specified symptoms and signs involving the circulatory and respiratory systems: Secondary | ICD-10-CM

## 2013-04-09 ENCOUNTER — Telehealth: Payer: Self-pay | Admitting: *Deleted

## 2013-04-09 ENCOUNTER — Other Ambulatory Visit: Payer: Self-pay | Admitting: *Deleted

## 2013-04-09 ENCOUNTER — Encounter: Payer: Self-pay | Admitting: *Deleted

## 2013-04-09 ENCOUNTER — Encounter (HOSPITAL_COMMUNITY): Payer: Self-pay | Admitting: Pharmacist

## 2013-04-09 DIAGNOSIS — I6521 Occlusion and stenosis of right carotid artery: Secondary | ICD-10-CM

## 2013-04-09 DIAGNOSIS — R0989 Other specified symptoms and signs involving the circulatory and respiratory systems: Secondary | ICD-10-CM

## 2013-04-09 DIAGNOSIS — I70219 Atherosclerosis of native arteries of extremities with intermittent claudication, unspecified extremity: Secondary | ICD-10-CM

## 2013-04-09 NOTE — Telephone Encounter (Signed)
Patient informed of carotid doppler results.

## 2013-04-14 ENCOUNTER — Encounter: Payer: Self-pay | Admitting: Vascular Surgery

## 2013-04-14 ENCOUNTER — Other Ambulatory Visit: Payer: Self-pay | Admitting: *Deleted

## 2013-04-15 ENCOUNTER — Ambulatory Visit (INDEPENDENT_AMBULATORY_CARE_PROVIDER_SITE_OTHER): Payer: Medicare Other | Admitting: Vascular Surgery

## 2013-04-15 ENCOUNTER — Encounter: Payer: Self-pay | Admitting: Vascular Surgery

## 2013-04-15 ENCOUNTER — Encounter (HOSPITAL_COMMUNITY): Payer: Self-pay

## 2013-04-15 ENCOUNTER — Encounter (HOSPITAL_COMMUNITY)
Admission: RE | Admit: 2013-04-15 | Discharge: 2013-04-15 | Disposition: A | Discharge status: Home / Self Care | Payer: Medicare Other | Source: Ambulatory Visit | Attending: Vascular Surgery | Admitting: Vascular Surgery

## 2013-04-15 DIAGNOSIS — I2510 Atherosclerotic heart disease of native coronary artery without angina pectoris: Secondary | ICD-10-CM | POA: Diagnosis present

## 2013-04-15 DIAGNOSIS — E119 Type 2 diabetes mellitus without complications: Secondary | ICD-10-CM | POA: Diagnosis present

## 2013-04-15 DIAGNOSIS — T82897A Other specified complication of cardiac prosthetic devices, implants and grafts, initial encounter: Secondary | ICD-10-CM | POA: Diagnosis present

## 2013-04-15 DIAGNOSIS — I214 Non-ST elevation (NSTEMI) myocardial infarction: Secondary | ICD-10-CM | POA: Diagnosis not present

## 2013-04-15 DIAGNOSIS — Y849 Medical procedure, unspecified as the cause of abnormal reaction of the patient, or of later complication, without mention of misadventure at the time of the procedure: Secondary | ICD-10-CM | POA: Clinically undetermined

## 2013-04-15 DIAGNOSIS — I739 Peripheral vascular disease, unspecified: Secondary | ICD-10-CM | POA: Diagnosis present

## 2013-04-15 DIAGNOSIS — Z87891 Personal history of nicotine dependence: Secondary | ICD-10-CM | POA: Diagnosis present

## 2013-04-15 DIAGNOSIS — Z6839 Body mass index (BMI) 39.0-39.9, adult: Secondary | ICD-10-CM

## 2013-04-15 DIAGNOSIS — Z9861 Coronary angioplasty status: Secondary | ICD-10-CM

## 2013-04-15 DIAGNOSIS — Z8673 Personal history of transient ischemic attack (TIA), and cerebral infarction without residual deficits: Secondary | ICD-10-CM

## 2013-04-15 DIAGNOSIS — N183 Chronic kidney disease, stage 3 unspecified: Secondary | ICD-10-CM | POA: Diagnosis present

## 2013-04-15 DIAGNOSIS — Z951 Presence of aortocoronary bypass graft: Secondary | ICD-10-CM | POA: Diagnosis present

## 2013-04-15 DIAGNOSIS — I6529 Occlusion and stenosis of unspecified carotid artery: Secondary | ICD-10-CM

## 2013-04-15 DIAGNOSIS — E7800 Pure hypercholesterolemia, unspecified: Secondary | ICD-10-CM | POA: Diagnosis present

## 2013-04-15 DIAGNOSIS — E785 Hyperlipidemia, unspecified: Secondary | ICD-10-CM | POA: Diagnosis present

## 2013-04-15 HISTORY — DX: Cardiac murmur, unspecified: R01.1

## 2013-04-15 LAB — CBC
HCT: 35.3 % — ABNORMAL LOW (ref 36.0–46.0)
Hemoglobin: 11.9 g/dL — ABNORMAL LOW (ref 12.0–15.0)
MCHC: 33.7 g/dL (ref 30.0–36.0)
RBC: 4.09 MIL/uL (ref 3.87–5.11)

## 2013-04-15 LAB — SURGICAL PCR SCREEN
MRSA, PCR: NEGATIVE
Staphylococcus aureus: NEGATIVE

## 2013-04-15 LAB — URINALYSIS, ROUTINE W REFLEX MICROSCOPIC
Glucose, UA: NEGATIVE mg/dL
Hgb urine dipstick: NEGATIVE
Leukocytes, UA: NEGATIVE
Specific Gravity, Urine: 1.011 (ref 1.005–1.030)

## 2013-04-15 LAB — APTT: aPTT: 33 seconds (ref 24–37)

## 2013-04-15 LAB — COMPREHENSIVE METABOLIC PANEL
Alkaline Phosphatase: 97 U/L (ref 39–117)
BUN: 51 mg/dL — ABNORMAL HIGH (ref 6–23)
CO2: 27 mEq/L (ref 19–32)
GFR calc Af Amer: 41 mL/min — ABNORMAL LOW (ref >90)
GFR calc non Af Amer: 36 mL/min — ABNORMAL LOW (ref >90)
Glucose, Bld: 130 mg/dL — ABNORMAL HIGH (ref 70–99)
Potassium: 4.2 mEq/L (ref 3.5–5.1)
Total Bilirubin: 0.2 mg/dL — ABNORMAL LOW (ref 0.3–1.2)
Total Protein: 7.3 g/dL (ref 6.0–8.3)

## 2013-04-15 LAB — PROTIME-INR: Prothrombin Time: 13.7 seconds (ref 11.6–15.2)

## 2013-04-15 NOTE — Pre-Procedure Instructions (Signed)
Donna Howe  04/15/2013   Your procedure is scheduled on:  Friday  04/18/13    Report to [place] at 530 AM.  Call this number if you have problems the morning of surgery: [phone]   Remember:   Do not eat food or drink liquids after midnight.   Take these medicines the morning of surgery with A SIP OF WATER:  Colchicine, aspirin, plavix, percocet if needed for pain   Do not wear jewelry, make-up or nail polish.  Do not wear lotions, powders, or perfumes. You may wear deodorant.  Do not shave 48 hours prior to surgery. Men may shave face and neck.  Do not bring valuables to the hospital.  [facility] is not responsible                   for any belongings or valuables.  Contacts, dentures or bridgework may not be worn into surgery.  Leave suitcase in the car. After surgery it may be brought to your room.  For patients admitted to the hospital, checkout time is 11:00 AM the day of  discharge.   Patients discharged the day of surgery will not be allowed to drive  home.  Name and phone number of your driver:   Special Instructions: Shower using CHG 2 nights before surgery and the night before surgery.  If you shower the day of surgery use CHG.  Use special wash - you have one bottle of CHG for all showers.  You should use approximately 1/3 of the bottle for each shower.   Please read over the following fact sheets that you were given: Pain Booklet, Coughing and Deep Breathing, Blood Transfusion Information, MRSA Information and Surgical Site Infection Prevention

## 2013-04-15 NOTE — Progress Notes (Signed)
Subjective:     Patient ID: Donna Howe, female   DOB: [birth year], 57 y.o.   MRN: [id number]  HPI this 57 year old female is known to me having been evaluated for claudication in the left leg a few months ago. She denies any neurologic symptoms such as previous stroke, lateralizing weakness, aphasia, amaurosis fugax, diplopia, blurred vision, or syncope. She does have her right carotid bruit and a carotid duplex exam was recently performed which revealed a 99+ percent right ICA stenosis. This was ordered by Dr. Daryel November. Patient had cardiac catheterization in April of this year with cardiac stents placed and is currently quite stable from a cardiac standpoint according to Dr. Ron Parker. She has a good ejection fraction. He does take Plavix.  Past Medical History  Diagnosis Date  . Hypercholesteremia   . Hypertension   . Diabetes mellitus   . DIABETES MELLITUS, TYPE II 03/31/2007  . HYPERLIPIDEMIA 03/31/2007  . Overweight(278.02) 08/26/2009  . HYPERTENSION 03/31/2007  . CAD (coronary artery disease) 03/31/2007  . ASTHMA 08/26/2009  . RENAL FAILURE 03/31/2007  . Gout   . SBO (small bowel obstruction) 2011    lap lysis of adhesions & hernia repair  . Shortness of breath   . Arthritis   . Chronic kidney disease (CKD), stage III (moderate)   . Hx of CABG     2005  . PAD (peripheral artery disease)     Dr. Kellie Simmering  . Ejection fraction     Normal EF echo 2010  //   EF 60%, catheterization, April, 2014  . Carotid artery occlusion     History  Substance Use Topics  . Smoking status: Former Smoker -- 1.00 packs/day for 20 years    Types: Cigarettes    Quit date: 12/10/2012  . Smokeless tobacco: Never Used  . Alcohol Use: No    Family History  Problem Relation Age of Onset  . Diabetes Mother   . Heart disease Mother   . Hyperlipidemia Mother   . Hypertension Mother   . Thyroid disease Father   . Hypertension Father   . AAA (abdominal aortic aneurysm) Father   . Heart disease Brother    . Hyperlipidemia Son   . Hypertension Son     Allergies  Allergen Reactions  . Penicillins     REACTION: Unknown, told as a child    Current outpatient prescriptions:aspirin EC 81 MG tablet, Take 81 mg by mouth daily., Disp: , Rfl: ;  atorvastatin (LIPITOR) 80 MG tablet, Take 80 mg by mouth at bedtime. , Disp: , Rfl: ;  clopidogrel (PLAVIX) 75 MG tablet, Take 1 tablet (75 mg total) by mouth daily., Disp: 30 tablet, Rfl: 11;  colchicine 0.6 MG tablet, Take 0.6 mg by mouth daily as needed (for gout flares). , Disp: , Rfl:  furosemide (LASIX) 40 MG tablet, Take 40 mg by mouth daily. , Disp: , Rfl: ;  glucose blood (ONE TOUCH ULTRA TEST) test strip, 1 each by Other route 2 (two) times daily. And lancets 2/day 250.43, Disp: 100 each, Rfl: 12;  insulin NPH (HUMULIN N,NOVOLIN N) 100 UNIT/ML injection, Inject 150 Units into the skin daily before breakfast., Disp: , Rfl: ;  Insulin Pen Needle 31G X 8 MM MISC, Use as directed three times a day , Disp: , Rfl:  isosorbide dinitrate (ISORDIL) 20 MG tablet, Take 20 mg by mouth at bedtime. , Disp: , Rfl: ;  nitroGLYCERIN (NITROSTAT) 0.4 MG SL tablet, Place 0.4 mg under the tongue every  5 (five) minutes as needed for chest pain. Not to exceed 3 in 15 minute time frame, Disp: , Rfl: ;  oxyCODONE-acetaminophen (PERCOCET) 10-325 MG per tablet, Take 1 tablet by mouth every 4 (four) hours as needed for pain., Disp: , Rfl:  quinapril (ACCUPRIL) 5 MG tablet, Take 5 mg by mouth daily., Disp: , Rfl:  No current facility-administered medications for this visit. Facility-Administered Medications Ordered in Other Visits: nitroGLYCERIN (NITROSTAT) SL tablet 0.4 mg, 0.4 mg, Sublingual, Q5 Min x 3 PRN, Aurora Mask, PA-C  BP 164/76  Pulse 67  Ht 5\' 5"  (1.651 m)  Wt 235 lb (106.595 kg)  BMI 39.11 kg/m2  Body mass index is 39.11 kg/(m^2).           Review of Systems complains of dyspnea on exertion, left calf claudication, occasional numbness in left foot, all  other systems negative and complete review of systems    Objective:   Physical Exam blood pressure 164/76 heart rate 67 respirations 15 Gen.-alert and oriented x3 in no apparent distress-obese HEENT normal for age Lungs no rhonchi or wheezing Cardiovascular regular rhythm no murmurs carotid pulses 3+ palpable -soft right carotid bruit audible Abdomen soft nontender no palpable masses-obese  Musculoskeletal free of  major deformities Skin clear -no rashes Neurologic normal Lower extremities 3+ femoral pulses palpable bilaterally. Both feet adequately perfused.  Today I ordered a right carotid duplex exam to compare to the recent study. I interpreted this examination does reveal a 99+ percent right ICA stenosis. Patient has mild occlusive disease in the contralateral left carotid.      Assessment:     99% right ICA stenosis-asymptomatic Coronary artery disease with history of PTCA and stenting in April 2014-stable    Plan:     Plan right carotid endarterectomy on Friday, August 1. Risks and benefits thoroughly discussed with patient and she would like to proceed We'll continue Plavix because of recent cardiac stenting. Patient realizes this may increase swelling or likelihood of hematoma and right neck and she is agreeable to proceed

## 2013-04-17 MED ORDER — VANCOMYCIN HCL 10 G IV SOLR
1500.0000 mg | INTRAVENOUS | Status: AC
  Administered 2013-04-18: 1500 mg via INTRAVENOUS
  Filled 2013-04-17: qty 1500

## 2013-04-18 ENCOUNTER — Inpatient Hospital Stay (HOSPITAL_COMMUNITY)
Admission: RE | Admit: 2013-04-18 | Discharge: 2013-04-21 | DRG: 037 | Disposition: A | Discharge status: Home / Self Care | Payer: Medicare Other | Source: Ambulatory Visit | Attending: Vascular Surgery | Admitting: Vascular Surgery

## 2013-04-18 ENCOUNTER — Encounter (HOSPITAL_COMMUNITY): Payer: Self-pay | Admitting: Anesthesiology

## 2013-04-18 ENCOUNTER — Telehealth: Payer: Self-pay | Admitting: Vascular Surgery

## 2013-04-18 ENCOUNTER — Inpatient Hospital Stay (HOSPITAL_COMMUNITY): Payer: Medicare Other | Admitting: Anesthesiology

## 2013-04-18 ENCOUNTER — Encounter (HOSPITAL_COMMUNITY): Payer: Self-pay | Admitting: *Deleted

## 2013-04-18 ENCOUNTER — Encounter (HOSPITAL_COMMUNITY)
Admission: RE | Disposition: A | Discharge status: Home / Self Care | Payer: Self-pay | Source: Ambulatory Visit | Attending: Vascular Surgery

## 2013-04-18 DIAGNOSIS — I6529 Occlusion and stenosis of unspecified carotid artery: Secondary | ICD-10-CM

## 2013-04-18 DIAGNOSIS — I214 Non-ST elevation (NSTEMI) myocardial infarction: Secondary | ICD-10-CM

## 2013-04-18 DIAGNOSIS — E669 Obesity, unspecified: Secondary | ICD-10-CM

## 2013-04-18 DIAGNOSIS — Z9889 Other specified postprocedural states: Secondary | ICD-10-CM

## 2013-04-18 HISTORY — PX: ENDARTERECTOMY: SHX5162

## 2013-04-18 HISTORY — PX: PATCH ANGIOPLASTY: SHX6230

## 2013-04-18 LAB — GLUCOSE, CAPILLARY
Glucose-Capillary: 122 mg/dL — ABNORMAL HIGH (ref 70–99)
Glucose-Capillary: 178 mg/dL — ABNORMAL HIGH (ref 70–99)

## 2013-04-18 LAB — CK TOTAL AND CKMB (NOT AT ARMC)
CK, MB: 2.5 ng/mL (ref 0.3–4.0)
Total CK: 102 U/L (ref 7–177)

## 2013-04-18 LAB — TROPONIN I: Troponin I: <0.3 ng/mL (ref <0.30)

## 2013-04-18 SURGERY — ENDARTERECTOMY, CAROTID
Anesthesia: General | Site: Neck | Laterality: Right | Wound class: Clean

## 2013-04-18 MED ORDER — OXYCODONE HCL 5 MG PO TABS: 5.0000 mg | ORAL_TABLET | ORAL | Status: DC | PRN

## 2013-04-18 MED ORDER — ROCURONIUM BROMIDE 100 MG/10ML IV SOLN
INTRAVENOUS | Status: DC | PRN
  Administered 2013-04-18: 30 mg via INTRAVENOUS

## 2013-04-18 MED ORDER — SODIUM CHLORIDE 0.9 % IV SOLN: INTRAVENOUS | Status: DC

## 2013-04-18 MED ORDER — PROPOFOL 10 MG/ML IV BOLUS
INTRAVENOUS | Status: DC | PRN
  Administered 2013-04-18: 150 mg via INTRAVENOUS

## 2013-04-18 MED ORDER — CLOPIDOGREL BISULFATE 75 MG PO TABS
75.0000 mg | ORAL_TABLET | Freq: Every day | ORAL | Status: DC
  Administered 2013-04-20 – 2013-04-21 (×2): 75 mg via ORAL
  Filled 2013-04-18 (×3): qty 1

## 2013-04-18 MED ORDER — PANTOPRAZOLE SODIUM 40 MG PO TBEC
40.0000 mg | DELAYED_RELEASE_TABLET | Freq: Every day | ORAL | Status: DC
  Administered 2013-04-20 – 2013-04-21 (×2): 40 mg via ORAL
  Filled 2013-04-18 (×2): qty 1

## 2013-04-18 MED ORDER — HYDROMORPHONE HCL PF 1 MG/ML IJ SOLN
INTRAMUSCULAR | Status: AC
  Filled 2013-04-18: qty 1

## 2013-04-18 MED ORDER — ATORVASTATIN CALCIUM 80 MG PO TABS
80.0000 mg | ORAL_TABLET | Freq: Every day | ORAL | Status: DC
  Administered 2013-04-18 – 2013-04-20 (×3): 80 mg via ORAL
  Filled 2013-04-18 (×4): qty 1

## 2013-04-18 MED ORDER — LABETALOL HCL 5 MG/ML IV SOLN
INTRAVENOUS | Status: AC
  Filled 2013-04-18: qty 4

## 2013-04-18 MED ORDER — ONDANSETRON HCL 4 MG/2ML IJ SOLN
INTRAMUSCULAR | Status: DC | PRN
  Administered 2013-04-18: 4 mg via INTRAVENOUS

## 2013-04-18 MED ORDER — OXYCODONE-ACETAMINOPHEN 10-325 MG PO TABS: 1.0000 | ORAL_TABLET | Freq: Four times a day (QID) | ORAL | Status: DC | PRN

## 2013-04-18 MED ORDER — LABETALOL HCL 5 MG/ML IV SOLN
INTRAVENOUS | Status: DC | PRN
  Administered 2013-04-18 (×3): 10 mg via INTRAVENOUS
  Administered 2013-04-18 (×2): 5 mg via INTRAVENOUS

## 2013-04-18 MED ORDER — SODIUM CHLORIDE 0.9 % IV SOLN
10.0000 mg | INTRAVENOUS | Status: DC | PRN
  Administered 2013-04-18: 5 ug/min via INTRAVENOUS

## 2013-04-18 MED ORDER — ZOLPIDEM TARTRATE 5 MG PO TABS: 5.0000 mg | ORAL_TABLET | Freq: Every evening | ORAL | Status: DC | PRN

## 2013-04-18 MED ORDER — GUAIFENESIN-DM 100-10 MG/5ML PO SYRP: 15.0000 mL | ORAL_SOLUTION | ORAL | Status: DC | PRN

## 2013-04-18 MED ORDER — LISINOPRIL 5 MG PO TABS
5.0000 mg | ORAL_TABLET | Freq: Every day | ORAL | Status: DC
  Administered 2013-04-18 – 2013-04-21 (×3): 5 mg via ORAL
  Filled 2013-04-18 (×4): qty 1

## 2013-04-18 MED ORDER — MORPHINE SULFATE 2 MG/ML IJ SOLN
2.0000 mg | INTRAMUSCULAR | Status: DC | PRN
  Administered 2013-04-18 (×5): 2 mg via INTRAVENOUS
  Filled 2013-04-18 (×2): qty 1

## 2013-04-18 MED ORDER — PROTAMINE SULFATE 10 MG/ML IV SOLN
INTRAVENOUS | Status: DC | PRN
  Administered 2013-04-18: 20 mg via INTRAVENOUS
  Administered 2013-04-18: 10 mg via INTRAVENOUS
  Administered 2013-04-18: 20 mg via INTRAVENOUS

## 2013-04-18 MED ORDER — PHENOL 1.4 % MT LIQD: 1.0000 | OROMUCOSAL | Status: DC | PRN

## 2013-04-18 MED ORDER — FUROSEMIDE 40 MG PO TABS
40.0000 mg | ORAL_TABLET | Freq: Every day | ORAL | Status: DC
  Administered 2013-04-18 – 2013-04-21 (×3): 40 mg via ORAL
  Filled 2013-04-18 (×4): qty 1

## 2013-04-18 MED ORDER — MORPHINE SULFATE 2 MG/ML IJ SOLN
INTRAMUSCULAR | Status: AC
  Filled 2013-04-18: qty 1

## 2013-04-18 MED ORDER — COLCHICINE 0.6 MG PO TABS
0.6000 mg | ORAL_TABLET | Freq: Every day | ORAL | Status: DC | PRN
  Filled 2013-04-18: qty 1

## 2013-04-18 MED ORDER — FENTANYL CITRATE 0.05 MG/ML IJ SOLN
INTRAMUSCULAR | Status: DC | PRN
  Administered 2013-04-18 (×3): 50 ug via INTRAVENOUS
  Administered 2013-04-18: 100 ug via INTRAVENOUS
  Administered 2013-04-18 (×2): 50 ug via INTRAVENOUS

## 2013-04-18 MED ORDER — LIDOCAINE HCL (CARDIAC) 20 MG/ML IV SOLN
INTRAVENOUS | Status: DC | PRN
  Administered 2013-04-18: 80 mg via INTRAVENOUS

## 2013-04-18 MED ORDER — LABETALOL HCL 5 MG/ML IV SOLN
10.0000 mg | INTRAVENOUS | Status: DC | PRN
  Administered 2013-04-18: 5 mg via INTRAVENOUS
  Filled 2013-04-18: qty 4

## 2013-04-18 MED ORDER — OXYCODONE HCL 5 MG/5ML PO SOLN: 5.0000 mg | Freq: Once | ORAL | Status: DC | PRN

## 2013-04-18 MED ORDER — DOCUSATE SODIUM 100 MG PO CAPS
100.0000 mg | ORAL_CAPSULE | Freq: Every day | ORAL | Status: DC
  Administered 2013-04-21: 100 mg via ORAL
  Filled 2013-04-18 (×2): qty 1

## 2013-04-18 MED ORDER — SODIUM CHLORIDE 0.9 % IR SOLN
Status: DC | PRN
  Administered 2013-04-18: 08:00:00

## 2013-04-18 MED ORDER — NITROGLYCERIN IN D5W 200-5 MCG/ML-% IV SOLN
INTRAVENOUS | Status: DC | PRN
  Administered 2013-04-18: 16.6 ug/min via INTRAVENOUS

## 2013-04-18 MED ORDER — PROMETHAZINE HCL 25 MG/ML IJ SOLN
INTRAMUSCULAR | Status: AC
  Filled 2013-04-18: qty 1

## 2013-04-18 MED ORDER — OXYCODONE-ACETAMINOPHEN 5-325 MG PO TABS
1.0000 | ORAL_TABLET | ORAL | Status: DC | PRN
  Administered 2013-04-18 – 2013-04-20 (×3): 1 via ORAL
  Filled 2013-04-18 (×3): qty 1

## 2013-04-18 MED ORDER — HYDRALAZINE HCL 20 MG/ML IJ SOLN
INTRAMUSCULAR | Status: DC | PRN
  Administered 2013-04-18 (×2): 5 mg via INTRAVENOUS

## 2013-04-18 MED ORDER — SODIUM CHLORIDE 0.9 % IV SOLN
INTRAVENOUS | Status: DC | PRN
  Administered 2013-04-18: 08:00:00 via INTRAVENOUS

## 2013-04-18 MED ORDER — NEOSTIGMINE METHYLSULFATE 1 MG/ML IJ SOLN
INTRAMUSCULAR | Status: DC | PRN
  Administered 2013-04-18: 3 mg via INTRAVENOUS

## 2013-04-18 MED ORDER — HYDROMORPHONE HCL PF 1 MG/ML IJ SOLN
0.2500 mg | INTRAMUSCULAR | Status: DC | PRN
  Administered 2013-04-18 (×3): 0.5 mg via INTRAVENOUS

## 2013-04-18 MED ORDER — INSULIN NPH (HUMAN) (ISOPHANE) 100 UNIT/ML ~~LOC~~ SUSP
150.0000 [IU] | Freq: Every day | SUBCUTANEOUS | Status: DC
  Administered 2013-04-20 – 2013-04-21 (×2): 150 [IU] via SUBCUTANEOUS
  Filled 2013-04-18 (×2): qty 10

## 2013-04-18 MED ORDER — ACETAMINOPHEN 325 MG PO TABS: 325.0000 mg | ORAL_TABLET | ORAL | Status: DC | PRN

## 2013-04-18 MED ORDER — PROMETHAZINE HCL 25 MG/ML IJ SOLN
6.2500 mg | INTRAMUSCULAR | Status: AC | PRN
  Administered 2013-04-18 (×2): 6.25 mg via INTRAVENOUS

## 2013-04-18 MED ORDER — LIDOCAINE HCL 4 % MT SOLN
OROMUCOSAL | Status: DC | PRN
  Administered 2013-04-18: 4 mL via TOPICAL

## 2013-04-18 MED ORDER — LACTATED RINGERS IV SOLN
INTRAVENOUS | Status: DC | PRN
  Administered 2013-04-18 (×2): via INTRAVENOUS

## 2013-04-18 MED ORDER — HEPARIN SODIUM (PORCINE) 1000 UNIT/ML IJ SOLN
INTRAMUSCULAR | Status: DC | PRN
  Administered 2013-04-18: 6000 [IU] via INTRAVENOUS

## 2013-04-18 MED ORDER — SODIUM CHLORIDE 0.9 % IV SOLN: 500.0000 mL | Freq: Once | INTRAVENOUS | Status: AC | PRN

## 2013-04-18 MED ORDER — HYDRALAZINE HCL 20 MG/ML IJ SOLN: 10.0000 mg | INTRAMUSCULAR | Status: DC | PRN

## 2013-04-18 MED ORDER — METOPROLOL TARTRATE 1 MG/ML IV SOLN: 2.0000 mg | INTRAVENOUS | Status: DC | PRN

## 2013-04-18 MED ORDER — OXYCODONE HCL 5 MG PO TABS: 5.0000 mg | ORAL_TABLET | Freq: Once | ORAL | Status: DC | PRN

## 2013-04-18 MED ORDER — 0.9 % SODIUM CHLORIDE (POUR BTL) OPTIME
TOPICAL | Status: DC | PRN
  Administered 2013-04-18: 2000 mL

## 2013-04-18 MED ORDER — HEMOSTATIC AGENTS (NO CHARGE) OPTIME
TOPICAL | Status: DC | PRN
  Administered 2013-04-18: 1 via TOPICAL

## 2013-04-18 MED ORDER — GLYCOPYRROLATE 0.2 MG/ML IJ SOLN
INTRAMUSCULAR | Status: DC | PRN
  Administered 2013-04-18: 0.4 mg via INTRAVENOUS

## 2013-04-18 MED ORDER — VANCOMYCIN HCL IN DEXTROSE 1-5 GM/200ML-% IV SOLN
1000.0000 mg | Freq: Two times a day (BID) | INTRAVENOUS | Status: AC
  Administered 2013-04-18 – 2013-04-19 (×2): 1000 mg via INTRAVENOUS
  Filled 2013-04-18 (×2): qty 200

## 2013-04-18 MED ORDER — DOPAMINE-DEXTROSE 3.2-5 MG/ML-% IV SOLN: 3.0000 ug/kg/min | INTRAVENOUS | Status: DC

## 2013-04-18 MED ORDER — ACETAMINOPHEN 325 MG RE SUPP
325.0000 mg | RECTAL | Status: DC | PRN
  Filled 2013-04-18: qty 2

## 2013-04-18 MED ORDER — NITROGLYCERIN 0.4 MG SL SUBL: 0.4000 mg | SUBLINGUAL_TABLET | SUBLINGUAL | Status: DC | PRN

## 2013-04-18 MED ORDER — INSULIN ASPART 100 UNIT/ML ~~LOC~~ SOLN
0.0000 [IU] | Freq: Three times a day (TID) | SUBCUTANEOUS | Status: DC
  Administered 2013-04-19 – 2013-04-20 (×3): 5 [IU] via SUBCUTANEOUS
  Administered 2013-04-20: 3 [IU] via SUBCUTANEOUS
  Administered 2013-04-21: 2 [IU] via SUBCUTANEOUS

## 2013-04-18 MED ORDER — SUCCINYLCHOLINE CHLORIDE 20 MG/ML IJ SOLN
INTRAMUSCULAR | Status: DC | PRN
  Administered 2013-04-18: 100 mg via INTRAVENOUS

## 2013-04-18 MED ORDER — OXYCODONE-ACETAMINOPHEN 10-325 MG PO TABS: 1.0000 | ORAL_TABLET | ORAL | Status: DC | PRN

## 2013-04-18 MED ORDER — ASPIRIN EC 81 MG PO TBEC
81.0000 mg | DELAYED_RELEASE_TABLET | Freq: Every day | ORAL | Status: DC
  Administered 2013-04-20 – 2013-04-21 (×2): 81 mg via ORAL
  Filled 2013-04-18 (×3): qty 1

## 2013-04-18 MED ORDER — LIDOCAINE HCL (PF) 1 % IJ SOLN
INTRAMUSCULAR | Status: AC
  Filled 2013-04-18: qty 30

## 2013-04-18 MED ORDER — ONDANSETRON HCL 4 MG/2ML IJ SOLN
4.0000 mg | Freq: Four times a day (QID) | INTRAMUSCULAR | Status: DC | PRN
  Administered 2013-04-18 – 2013-04-19 (×2): 4 mg via INTRAVENOUS
  Filled 2013-04-18 (×2): qty 2

## 2013-04-18 SURGICAL SUPPLY — 45 items
CANISTER SUCTION 2500CC (MISCELLANEOUS) ×3 IMPLANT
CATH ROBINSON RED A/P 18FR (CATHETERS) ×3 IMPLANT
CATH SUCT 10FR WHISTLE TIP (CATHETERS) ×3 IMPLANT
CLIP TI MEDIUM 24 (CLIP) ×3 IMPLANT
CLIP TI WIDE RED SMALL 24 (CLIP) ×3 IMPLANT
CLOTH BEACON ORANGE TIMEOUT ST (SAFETY) ×3 IMPLANT
COVER SURGICAL LIGHT HANDLE (MISCELLANEOUS) ×3 IMPLANT
CRADLE DONUT ADULT HEAD (MISCELLANEOUS) ×3 IMPLANT
DECANTER SPIKE VIAL GLASS SM (MISCELLANEOUS) ×3 IMPLANT
DRAIN HEMOVAC 1/8 X 5 (WOUND CARE) ×3 IMPLANT
DRAPE WARM FLUID 44X44 (DRAPE) ×3 IMPLANT
ELECT REM PT RETURN 9FT ADLT (ELECTROSURGICAL) ×3
ELECTRODE REM PT RTRN 9FT ADLT (ELECTROSURGICAL) ×2 IMPLANT
EVACUATOR SILICONE 100CC (DRAIN) ×3 IMPLANT
GLOVE BIO SURGEON STRL SZ 6.5 (GLOVE) ×6 IMPLANT
GLOVE BIOGEL PI IND STRL 6.5 (GLOVE) ×2 IMPLANT
GLOVE BIOGEL PI IND STRL 7.0 (GLOVE) ×2 IMPLANT
GLOVE BIOGEL PI INDICATOR 6.5 (GLOVE) ×1
GLOVE BIOGEL PI INDICATOR 7.0 (GLOVE) ×1
GLOVE ECLIPSE 6.5 STRL STRAW (GLOVE) ×6 IMPLANT
GLOVE SS BIOGEL STRL SZ 7 (GLOVE) ×2 IMPLANT
GLOVE SUPERSENSE BIOGEL SZ 7 (GLOVE) ×1
GOWN STRL NON-REIN LRG LVL3 (GOWN DISPOSABLE) IMPLANT
HEMOSTAT SNOW SURGICEL 2X4 (HEMOSTASIS) ×3 IMPLANT
INSERT FOGARTY SM (MISCELLANEOUS) ×3 IMPLANT
KIT BASIN OR (CUSTOM PROCEDURE TRAY) ×3 IMPLANT
KIT ROOM TURNOVER OR (KITS) ×3 IMPLANT
NEEDLE 22X1 1/2 (OR ONLY) (NEEDLE) IMPLANT
NS IRRIG 1000ML POUR BTL (IV SOLUTION) ×6 IMPLANT
PACK CAROTID (CUSTOM PROCEDURE TRAY) ×3 IMPLANT
PAD ARMBOARD 7.5X6 YLW CONV (MISCELLANEOUS) ×6 IMPLANT
PATCH HEMASHIELD 8X75 (Vascular Products) ×3 IMPLANT
SHUNT CAROTID BYPASS 12FRX15.5 (VASCULAR PRODUCTS) IMPLANT
SUT PROLENE 6 0 C 1 24 (SUTURE) ×3 IMPLANT
SUT PROLENE 6 0 CC (SUTURE) ×18 IMPLANT
SUT SILK 2 0 FS (SUTURE) ×6 IMPLANT
SUT SILK 3 0 TIES 17X18 (SUTURE)
SUT SILK 3-0 18XBRD TIE BLK (SUTURE) IMPLANT
SUT VIC AB 2-0 CT1 27 (SUTURE) ×1
SUT VIC AB 2-0 CT1 TAPERPNT 27 (SUTURE) ×2 IMPLANT
SUT VIC AB 3-0 X1 27 (SUTURE) ×3 IMPLANT
SYR CONTROL 10ML LL (SYRINGE) IMPLANT
TOWEL OR 17X24 6PK STRL BLUE (TOWEL DISPOSABLE) ×3 IMPLANT
TOWEL OR 17X26 10 PK STRL BLUE (TOWEL DISPOSABLE) ×3 IMPLANT
WATER STERILE IRR 1000ML POUR (IV SOLUTION) ×3 IMPLANT

## 2013-04-18 NOTE — Transfer of Care (Signed)
Immediate Anesthesia Transfer of Care Note  Patient: Donna Howe  Procedure(s) Performed: Procedure(s): ENDARTERECTOMY CAROTID (Right) PATCH ANGIOPLASTY Right Internal Carotid Artery (Right)  Patient Location: PACU  Anesthesia Type:General  Level of Consciousness: awake, alert , oriented and patient cooperative  Airway & Oxygen Therapy: Patient Spontanous Breathing and Patient connected to nasal cannula oxygen  Post-op Assessment: Report given to PACU RN, Post -op Vital signs reviewed and stable and Patient moving all extremities  Post vital signs: Reviewed and stable  Complications: No apparent anesthesia complications

## 2013-04-18 NOTE — Op Note (Signed)
OPERATIVE REPORT  Date of Surgery: 04/18/2013  Surgeon: Tinnie Gens, MD  Assistant: Dionicio Stall  Pre-op Diagnosis: RIGHT ICA STENOSIS-severe-asymptomatic Post-op Diagnosis: Right ICA Stenosis-severe-asymptomatic  Procedure: Procedure(s): ENDARTERECTOMY CAROTID--right PATCH ANGIOPLASTY Right Internal Carotid Artery  Anesthesia: General  EBL: [id number] cc  Complications: None  Procedure Details: The patient was taken to the operating room and placed in the supine position. Following induction of satisfactory general endotracheal anesthesia the right neck was prepped and draped in a routine sterile manner. Incision was made on the anterior border of the sternocleidomastoid muscle and carried down through the subcutaneous tissue and platysma using the Bovie. Care was taken not to injure the hypoglossal nerve.. The common internal and external carotid arteries were dissected free. There was a calcified atherosclerotic plaque at the carotid bifurcation extending up the internal carotid artery. A #10 shunt was then prepared and the patient was heparinized. The carotid vessels were occluded with vascular clamps. A longitudinal opening was made in the common carotid with a 15 blade extended up the internal carotid with the Potts scissors to a point distal to the disease. The plaque was approximately 99% stenotic in severity. The distal vessel appeared normal. Shunt was inserted without difficulty reestablishing flow in about 2 minutes. A standard endarterectomy was performed with an eversion endarterectomy of the external carotid. The plaque feathered off  the distal internal carotid artery nicely not requiring any tacking sutures. The lumen was thoroughly irrigated with heparinized saline and loose debris all carefully removed. The arterotomy was then closed with a patch using continuous 6-0 Prolene. Prior to completion of the  Closure the  shunt was removed after approximately 30 minutes of shunt time.  Flow was then reestablished up the external branch initially followed by the internal branch. Protamine was given to her reverse the heparin.  The patient did have significant oozing due to preoperative Plavix for a cardiac stent which was not discontinued. Therefore a 10 flat Jackson-Pratt drain was left in place brought out through an inferiorly based stab wound secured with silk suture..Following adequate hemostasis the wound was irrigated with saline and closed in layers with Vicryl ain a subcuticular fashion. Sterile dressing was applied and the patient taken to the recovery room in stable condition.  Tinnie Gens, MD 04/18/2013 10:03 AM

## 2013-04-18 NOTE — Telephone Encounter (Addendum)
Message copied by Gena Fray on Fri Apr 18, 2013  3:07 PM ------      Message from: Mena Goes      Created: Fri Apr 18, 2013 10:46 AM      Regarding: schedule                   ----- Message -----         From: Gabriel Earing, PA-C         Sent: 04/18/2013  10:01 AM           To: Mena Goes, CMA            S/p right CEA 04/18/13.  F/u with Dr. Kellie Simmering in 2 weeks.            Thanks,            Samantha ------  04/18/2013: left message for pt at home # regarding appt information, dpm

## 2013-04-18 NOTE — Progress Notes (Signed)
Notified Dr.Crews that pt took Nitro---will pass that on to [facility] and let him decide how he wants to proceed

## 2013-04-18 NOTE — Anesthesia Postprocedure Evaluation (Signed)
  Anesthesia Post-op Note  Patient: Donna Howe  Procedure(s) Performed: Procedure(s): ENDARTERECTOMY CAROTID (Right) PATCH ANGIOPLASTY Right Internal Carotid Artery (Right)  Patient Location: PACU  Anesthesia Type:General  Level of Consciousness: awake and alert   Airway and Oxygen Therapy: Patient Spontanous Breathing  Post-op Pain: mild  Post-op Assessment: Post-op Vital signs reviewed  Post-op Vital Signs: stable  Complications: No apparent anesthesia complications

## 2013-04-18 NOTE — Anesthesia Procedure Notes (Signed)
Procedure Name: Intubation Date/Time: 04/18/2013 7:58 AM Performed by: Julian Reil Pre-anesthesia Checklist: Patient identified, Emergency Drugs available, Suction available and Patient being monitored Patient Re-evaluated:Patient Re-evaluated prior to inductionOxygen Delivery Method: Circle system utilized Preoxygenation: Pre-oxygenation with 100% oxygen Intubation Type: IV induction Ventilation: Mask ventilation without difficulty Laryngoscope Size: Mac and 3 Grade View: Grade I Tube type: Oral Tube size: 7.5 mm Number of attempts: 1 Airway Equipment and Method: Stylet and LTA kit utilized Placement Confirmation: ETT inserted through vocal cords under direct vision,  positive ETCO2 and breath sounds checked- equal and bilateral Secured at: 21 cm Tube secured with: Tape Dental Injury: Teeth and Oropharynx as per pre-operative assessment

## 2013-04-18 NOTE — Progress Notes (Signed)
Pharmacy may adjust antibiotics for renal dysfunction.  CrCl is ~ 50 ml/min - per obesity nomogram would need 1500 q 24 - but I think x 2 doses post-op, 1 g q 12 should be fine (as ordered). Pre-op dose given this AM ~ 745.  Uvaldo Rising, BCPS  Clinical Pharmacist Pager [phone]  04/18/2013 5:57 PM

## 2013-04-18 NOTE — H&P (View-Only) (Signed)
Subjective:     Patient ID: Donna Howe, female   DOB: 04-12-1956, 57 y.o.   MRN: [id number]  HPI this 57 year old female is known to me having been evaluated for claudication in the left leg a few months ago. She denies any neurologic symptoms such as previous stroke, lateralizing weakness, aphasia, amaurosis fugax, diplopia, blurred vision, or syncope. She does have her right carotid bruit and a carotid duplex exam was recently performed which revealed a 99+ percent right ICA stenosis. This was ordered by Dr. Daryel November. Patient had cardiac catheterization in April of this year with cardiac stents placed and is currently quite stable from a cardiac standpoint according to Dr. Ron Parker. She has a good ejection fraction. He does take Plavix.  Past Medical History  Diagnosis Date  . Hypercholesteremia   . Hypertension   . Diabetes mellitus   . DIABETES MELLITUS, TYPE II 03/31/2007  . HYPERLIPIDEMIA 03/31/2007  . Overweight(278.02) 08/26/2009  . HYPERTENSION 03/31/2007  . CAD (coronary artery disease) 03/31/2007  . ASTHMA 08/26/2009  . RENAL FAILURE 03/31/2007  . Gout   . SBO (small bowel obstruction) 2011    lap lysis of adhesions & hernia repair  . Shortness of breath   . Arthritis   . Chronic kidney disease (CKD), stage III (moderate)   . Hx of CABG     2005  . PAD (peripheral artery disease)     Dr. Kellie Simmering  . Ejection fraction     Normal EF echo 2010  //   EF 60%, catheterization, April, 2014  . Carotid artery occlusion     History  Substance Use Topics  . Smoking status: Former Smoker -- 1.00 packs/day for 20 years    Types: Cigarettes    Quit date: 12/10/2012  . Smokeless tobacco: Never Used  . Alcohol Use: No    Family History  Problem Relation Age of Onset  . Diabetes Mother   . Heart disease Mother   . Hyperlipidemia Mother   . Hypertension Mother   . Thyroid disease Father   . Hypertension Father   . AAA (abdominal aortic aneurysm) Father   . Heart disease Brother    . Hyperlipidemia Son   . Hypertension Son     Allergies  Allergen Reactions  . Penicillins     REACTION: Unknown, told as a child    Current outpatient prescriptions:aspirin EC 81 MG tablet, Take 81 mg by mouth daily., Disp: , Rfl: ;  atorvastatin (LIPITOR) 80 MG tablet, Take 80 mg by mouth at bedtime. , Disp: , Rfl: ;  clopidogrel (PLAVIX) 75 MG tablet, Take 1 tablet (75 mg total) by mouth daily., Disp: 30 tablet, Rfl: 11;  colchicine 0.6 MG tablet, Take 0.6 mg by mouth daily as needed (for gout flares). , Disp: , Rfl:  furosemide (LASIX) 40 MG tablet, Take 40 mg by mouth daily. , Disp: , Rfl: ;  glucose blood (ONE TOUCH ULTRA TEST) test strip, 1 each by Other route 2 (two) times daily. And lancets 2/day 250.43, Disp: 100 each, Rfl: 12;  insulin NPH (HUMULIN N,NOVOLIN N) 100 UNIT/ML injection, Inject 150 Units into the skin daily before breakfast., Disp: , Rfl: ;  Insulin Pen Needle 31G X 8 MM MISC, Use as directed three times a day , Disp: , Rfl:  isosorbide dinitrate (ISORDIL) 20 MG tablet, Take 20 mg by mouth at bedtime. , Disp: , Rfl: ;  nitroGLYCERIN (NITROSTAT) 0.4 MG SL tablet, Place 0.4 mg under the tongue every  5 (five) minutes as needed for chest pain. Not to exceed 3 in 15 minute time frame, Disp: , Rfl: ;  oxyCODONE-acetaminophen (PERCOCET) 10-325 MG per tablet, Take 1 tablet by mouth every 4 (four) hours as needed for pain., Disp: , Rfl:  quinapril (ACCUPRIL) 5 MG tablet, Take 5 mg by mouth daily., Disp: , Rfl:  No current facility-administered medications for this visit. Facility-Administered Medications Ordered in Other Visits: nitroGLYCERIN (NITROSTAT) SL tablet 0.4 mg, 0.4 mg, Sublingual, Q5 Min x 3 PRN, Aurora Mask, PA-C  BP 164/76  Pulse 67  Ht 5\' 5"  (1.651 m)  Wt 235 lb (106.595 kg)  BMI 39.11 kg/m2  Body mass index is 39.11 kg/(m^2).           Review of Systems complains of dyspnea on exertion, left calf claudication, occasional numbness in left foot, all  other systems negative and complete review of systems    Objective:   Physical Exam blood pressure 164/76 heart rate 67 respirations 15 Gen.-alert and oriented x3 in no apparent distress-obese HEENT normal for age Lungs no rhonchi or wheezing Cardiovascular regular rhythm no murmurs carotid pulses 3+ palpable -soft right carotid bruit audible Abdomen soft nontender no palpable masses-obese  Musculoskeletal free of  major deformities Skin clear -no rashes Neurologic normal Lower extremities 3+ femoral pulses palpable bilaterally. Both feet adequately perfused.  Today I ordered a right carotid duplex exam to compare to the recent study. I interpreted this examination does reveal a 99+ percent right ICA stenosis. Patient has mild occlusive disease in the contralateral left carotid.      Assessment:     99% right ICA stenosis-asymptomatic Coronary artery disease with history of PTCA and stenting in April 2014-stable    Plan:     Plan right carotid endarterectomy on Friday, August 1. Risks and benefits thoroughly discussed with patient and she would like to proceed We'll continue Plavix because of recent cardiac stenting. Patient realizes this may increase swelling or likelihood of hematoma and right neck and she is agreeable to proceed

## 2013-04-18 NOTE — Anesthesia Preprocedure Evaluation (Addendum)
Anesthesia Evaluation  Patient identified by MRN, date of birth, ID band  Reviewed: Allergy & Precautions, NPO status   Airway Mallampati: II  Neck ROM: Full    Dental  (+) Teeth Intact   Pulmonary shortness of breath, asthma ,  breath sounds clear to auscultation        Cardiovascular hypertension, + angina + CAD and + Peripheral Vascular Disease + Valvular Problems/Murmurs Rhythm:Regular Rate:Normal     Neuro/Psych    GI/Hepatic   Endo/Other  diabetes  Renal/GU Renal disease     Musculoskeletal   Abdominal (+) + obese,   Peds  Hematology   Anesthesia Other Findings   Reproductive/Obstetrics                          Anesthesia Physical Anesthesia Plan  ASA: III  Anesthesia Plan: General   Post-op Pain Management:    Induction:   Airway Management Planned: Oral ETT  Additional Equipment: Arterial line  Intra-op Plan:   Post-operative Plan: Extubation in OR  Informed Consent: I have reviewed the patients History and Physical, chart, labs and discussed the procedure including the risks, benefits and alternatives for the proposed anesthesia with the patient or authorized representative who has indicated his/her understanding and acceptance.   Dental advisory given  Plan Discussed with: CRNA and Surgeon  Anesthesia Plan Comments:         Anesthesia Quick Evaluation

## 2013-04-18 NOTE — Interval H&P Note (Signed)
History and Physical Interval Note:  04/18/2013 7:25 AM  Donna Howe  has presented today for surgery, with the diagnosis of RIGHT ICA STENOSIS  The various methods of treatment have been discussed with the patient and family. After consideration of risks, benefits and other options for treatment, the patient has consented to  Procedure(s): ENDARTERECTOMY CAROTID (Right) as a surgical intervention .  The patient's history has been reviewed, patient examined, no change in status, stable for surgery.  I have reviewed the patient's chart and labs.  Questions were answered to the patient's satisfaction.     Tinnie Gens

## 2013-04-18 NOTE — Preoperative (Signed)
Beta Blockers   Reason not to administer Beta Blockers:Not Applicable 

## 2013-04-18 NOTE — Progress Notes (Signed)
Chest pain(pt states was anxiety) at [place] and took 1 Nitroglycerin.Noticed relief within minutes after.

## 2013-04-19 ENCOUNTER — Ambulatory Visit (HOSPITAL_COMMUNITY): Admit: 2013-04-19 | Payer: Self-pay | Admitting: Cardiovascular Disease

## 2013-04-19 ENCOUNTER — Encounter (HOSPITAL_COMMUNITY)
Admission: RE | Disposition: A | Discharge status: Home / Self Care | Payer: Self-pay | Source: Ambulatory Visit | Attending: Vascular Surgery

## 2013-04-19 DIAGNOSIS — I2510 Atherosclerotic heart disease of native coronary artery without angina pectoris: Secondary | ICD-10-CM

## 2013-04-19 HISTORY — PX: LEFT HEART CATHETERIZATION WITH CORONARY/GRAFT ANGIOGRAM: SHX5450

## 2013-04-19 LAB — GLUCOSE, CAPILLARY
Glucose-Capillary: 225 mg/dL — ABNORMAL HIGH (ref 70–99)
Glucose-Capillary: 211 mg/dL — ABNORMAL HIGH (ref 70–99)
Glucose-Capillary: 174 mg/dL — ABNORMAL HIGH (ref 70–99)

## 2013-04-19 LAB — CBC
HCT: 30.4 % — ABNORMAL LOW (ref 36.0–46.0)
Hemoglobin: 10.1 g/dL — ABNORMAL LOW (ref 12.0–15.0)
MCHC: 33.2 g/dL (ref 30.0–36.0)
RBC: 3.44 MIL/uL — ABNORMAL LOW (ref 3.87–5.11)
WBC: 11.9 10*3/uL — ABNORMAL HIGH (ref 4.0–10.5)

## 2013-04-19 LAB — BASIC METABOLIC PANEL
BUN: 32 mg/dL — ABNORMAL HIGH (ref 6–23)
CO2: 24 mEq/L (ref 19–32)
Chloride: 103 mEq/L (ref 96–112)
GFR calc non Af Amer: 39 mL/min — ABNORMAL LOW (ref >90)
Glucose, Bld: 195 mg/dL — ABNORMAL HIGH (ref 70–99)
Potassium: 5.1 mEq/L (ref 3.5–5.1)
Sodium: 135 mEq/L (ref 135–145)

## 2013-04-19 LAB — CK TOTAL AND CKMB (NOT AT ARMC)
CK, MB: 16.1 ng/mL (ref 0.3–4.0)
Relative Index: 9.4 — ABNORMAL HIGH (ref 0.0–2.5)

## 2013-04-19 LAB — TROPONIN I
Troponin I: 2.8 ng/mL (ref <0.30)
Troponin I: 3.51 ng/mL (ref <0.30)

## 2013-04-19 SURGERY — LEFT HEART CATHETERIZATION WITH CORONARY/GRAFT ANGIOGRAM
Anesthesia: LOCAL

## 2013-04-19 MED ORDER — FENTANYL CITRATE 0.05 MG/ML IJ SOLN
INTRAMUSCULAR | Status: AC
  Filled 2013-04-19: qty 2

## 2013-04-19 MED ORDER — HEPARIN (PORCINE) IN NACL 100-0.45 UNIT/ML-% IJ SOLN
1000.0000 [IU]/h | INTRAMUSCULAR | Status: DC
  Administered 2013-04-19: 1000 [IU]/h via INTRAVENOUS
  Filled 2013-04-19: qty 250

## 2013-04-19 MED ORDER — SODIUM CHLORIDE 0.9 % IV SOLN: 1.0000 mL/kg/h | INTRAVENOUS | Status: DC

## 2013-04-19 MED ORDER — ONDANSETRON HCL 4 MG/2ML IJ SOLN
INTRAMUSCULAR | Status: AC
  Filled 2013-04-19: qty 2

## 2013-04-19 MED ORDER — HEPARIN (PORCINE) IN NACL 2-0.9 UNIT/ML-% IJ SOLN
INTRAMUSCULAR | Status: AC
  Filled 2013-04-19: qty 1000

## 2013-04-19 MED ORDER — ASPIRIN 81 MG PO CHEW
CHEWABLE_TABLET | ORAL | Status: AC
  Filled 2013-04-19: qty 4

## 2013-04-19 MED ORDER — SODIUM CHLORIDE 0.9 % IV SOLN
INTRAVENOUS | Status: AC
  Administered 2013-04-19: 12:00:00 via INTRAVENOUS

## 2013-04-19 MED ORDER — ACETAMINOPHEN 325 MG PO TABS: 650.0000 mg | ORAL_TABLET | ORAL | Status: DC | PRN

## 2013-04-19 MED ORDER — ONDANSETRON HCL 4 MG/2ML IJ SOLN
4.0000 mg | Freq: Four times a day (QID) | INTRAMUSCULAR | Status: DC | PRN
  Administered 2013-04-19: 4 mg via INTRAVENOUS
  Filled 2013-04-19: qty 2

## 2013-04-19 MED ORDER — NITROGLYCERIN IN D5W 200-5 MCG/ML-% IV SOLN
2.0000 ug/min | INTRAVENOUS | Status: DC
  Administered 2013-04-19: 5 ug/min via INTRAVENOUS
  Filled 2013-04-19: qty 250

## 2013-04-19 MED ORDER — DIAZEPAM 5 MG PO TABS
10.0000 mg | ORAL_TABLET | ORAL | Status: AC
  Administered 2013-04-19: 10 mg via ORAL
  Filled 2013-04-19: qty 2

## 2013-04-19 MED ORDER — MIDAZOLAM HCL 2 MG/2ML IJ SOLN
INTRAMUSCULAR | Status: AC
  Filled 2013-04-19: qty 2

## 2013-04-19 MED ORDER — MORPHINE SULFATE 2 MG/ML IJ SOLN
1.0000 mg | INTRAMUSCULAR | Status: DC | PRN
  Administered 2013-04-19 (×2): 1 mg via INTRAVENOUS
  Filled 2013-04-19 (×2): qty 1

## 2013-04-19 MED ORDER — METOPROLOL TARTRATE 1 MG/ML IV SOLN
5.0000 mg | Freq: Once | INTRAVENOUS | Status: AC
  Administered 2013-04-19: 5 mg via INTRAVENOUS

## 2013-04-19 MED ORDER — METOPROLOL TARTRATE 1 MG/ML IV SOLN
INTRAVENOUS | Status: AC
  Administered 2013-04-19: 5 mg via INTRAVENOUS
  Filled 2013-04-19: qty 5

## 2013-04-19 MED ORDER — LIDOCAINE HCL (PF) 1 % IJ SOLN
INTRAMUSCULAR | Status: AC
  Filled 2013-04-19: qty 30

## 2013-04-19 MED ORDER — NITROGLYCERIN 0.2 MG/ML ON CALL CATH LAB
INTRAVENOUS | Status: AC
  Filled 2013-04-19: qty 1

## 2013-04-19 NOTE — H&P (Signed)
    Pt was reexamined and existing H & P reviewed. No changes found.  Lorretta Harp, MD [facility] 04/19/2013 10:56 AM

## 2013-04-19 NOTE — Progress Notes (Addendum)
[facility] Progress Note  04/19/2013 7:47 AM 1 Day Post-Op  Subjective:  Chest pain last night and again this am  Tm 99.6 now 99.1 [id number] systolic HR 0000000 [id number] [id number]  Filed Vitals:   04/19/13 0500  BP: 161/53  Pulse: 88  Temp:   Resp: 20     Physical Exam: Neuro:  In tact Incision:  C/d/i  CBC    Component Value Date/Time   WBC 11.9* 04/19/2013 0430   RBC 3.44* 04/19/2013 0430   HGB 10.1* 04/19/2013 0430   HCT 30.4* 04/19/2013 0430   PLT 141* 04/19/2013 0430   MCV 88.4 04/19/2013 0430   MCH 29.4 04/19/2013 0430   MCHC 33.2 04/19/2013 0430   RDW 12.9 04/19/2013 0430   LYMPHSABS 1.7 03/06/2010 1100   MONOABS 1.0 03/06/2010 1100   EOSABS 0.1 03/06/2010 1100   BASOSABS 0.0 03/06/2010 1100    BMET    Component Value Date/Time   NA 135 04/19/2013 0430   K 5.1 04/19/2013 0430   CL 103 04/19/2013 0430   CO2 24 04/19/2013 0430   GLUCOSE 195* 04/19/2013 0430   BUN 32* 04/19/2013 0430   CREATININE 1.47* 04/19/2013 0430   CALCIUM 9.3 04/19/2013 0430   GFRNONAA 39* 04/19/2013 0430   GFRAA 46* 04/19/2013 0430     Intake/Output Summary (Last 24 hours) at 04/19/13 0747 Last data filed at 04/19/13 0500  Gross per 24 hour  Intake   2845 ml  Output   2029 ml  Net    816 ml   JP Drain:     Assessment/Plan:  This is a 57 y.o. female who is s/p right CEA 1 Day Post-Op  -Chest pain last night and this am-elevated troponin with 2nd set at 1.57 (first set negative).  Still having chest pain this am.  She is a pt of Dr. Susie Cassette consult [facility] cards.  EKG from last pm unchanged from April.  Will get another EKG this am.  She does have a drug eluting stent to the left main and circumflex for in stent restenosis in April 2014 and is on Plavix. -spoke with Kathleen Argue, PA-C-MD to see pt this am -pt is doing well this am from surgical stand point -pt neuro exam is in tact -pt has not ambulated -foley cath placed last night as she could not void -BUN/Cr improved from 3 days ago at 32/1.47  from 51/1.59-good UOP   Samantha Rhyne, PA-C [facility] [phone] I have examined the patient, reviewed and agree with above. Discussed with Dr Wynonia Lawman who is seeing pt now.  DC foley.  OK for NTG and heparin as needed  EARLY, TODD, MD 04/19/2013 8:26 AM

## 2013-04-19 NOTE — CV Procedure (Signed)
Donna Howe is a 57 y.o. female    [id number] LOCATION:  FACILITY: [facility]  PHYSICIAN: Quay Burow, M.D. 05/05/1956   DATE OF PROCEDURE:  04/19/2013  DATE OF DISCHARGE:   CARDIAC CATHETERIZATION     History obtained from chart review [name] is a 57 year old moderately overweight Caucasian female for history of CAD and multiple interventions. She had PCI and stenting of her left main/proximal dominant circumflex (protected vessel) by Dr. Peter Martinique several months ago. At the time of cath her circumflex obtuse partial marginal branch vein graft was occluded. Her RCA vein graft was patent as was the LIMA to her LAD. She underwent uncomplicated elective right carotid endarterectomy by Dr. Victorino Dike. This morning she was evaluated by Dr. Tollie Eth complaining of chest pain and nausea. Her EKG showed ST segment depression in her troponin was mildly elevated. Because of this it was decided to bring her to the Cath Lab to define her anatomy.   PROCEDURE DESCRIPTION:    The patient was brought to the second floor [facility] cath lab in the postabsorptive state. She was premedicated with Valium 5 mg by mouth, IV Versed and fentanyl. Her right groinwas prepped and shaved in usual sterile fashion. Xylocaine 1% was used for local anesthesia. A 6 French sheath was inserted into the right common femoral artery using standard Seldinger technique. 56 French right and left Judkins diagnostic catheters were used for selective coronary angiography selective graft angiography. A lethargic milligrams not performed to conserve contrast because of moderate renal insufficiency. Visipaque dye was just for the entirety of the case. A retrograde aortic pressure was monitored during the case. A total of 60 cc of contrast was administered to the patient.   HEMODYNAMICS:    AO SYSTOLIC/AO DIASTOLIC: [id number]    ANGIOGRAPHIC RESULTS:   1. Left main; widely patent stent extending from the left main  into the proximal circumflex with that most 30% in-stent restenosis".There was a moderate sized though small caliber marginal branch originating from within the stented segment that had a 95% ostial stenosis  2. LAD; 95% ostial  With occlusion after a septal and diagonal branch 3. Left circumflex; prominent with a widely patent stented segment proximally.  4. Right coronary artery; subtotally occluded in its proximal midportion 5.LIMA TO LAD; widely patent 6. SVG TO mid RCA was widely patent 7. Left ventriculography; was not performed  IMPRESSION:[name] has anatomy unchanged from her prior cath. Her RCA vein graft is patent as is her LIMA to her LAD. The protected left main/proximal dominant circumflex stent is widely patent as well. I'm not sure the etiology of her chest pain or her mildly elevated troponins and ST segment depression though I cannot find the culprit vessel. Continued medical therapy will be recommended. An ACT was measured and an Chief Financial Officer was performed of the right common femoral artery. A 6 French [facility] Closure device was then deployed with excellent hemostasis.  Lorretta Harp MD, [facility] 04/19/2013 11:01 AM

## 2013-04-19 NOTE — Progress Notes (Signed)
Called by primary RN to see pt for CP.  Upon arrival pt was resting comfortably upon arrival with SBP 160s & HR 90s.  Pt had rcvd Morphine prior to my arrival.  EKG obtained.  Dr. Donnetta Hutching paged & updated & orders rcvd for serial troponins & monitor BP.

## 2013-04-19 NOTE — Consult Note (Signed)
Cardiology Consult Note  Admit date: 04/18/2013 Name: Donna Howe 57 y.o.  female DOB:  December 30, 1955 MRN:  [id number]  Today's date:  04/19/2013  Referring Physician:  Dr. Curt Jews  Reason for Consultation:    Chest pain  IMPRESSIONS: 1. Ongoing chest discomfort following carotid endarterectomy with abnormal troponins consistent with non-ST elevation myocardial infarction 2. Coronary artery disease with previous bypass grafting and previous drug-eluting stent in April of this year for in-stent restenosis of the distal left main and proximal circumflex 3. Type 2 diabetes mellitus 4. Hyperlipidemia 5. Chronic kidney disease stage III 6. Peripheral vascular disease  RECOMMENDATION: Discussed with Dr. Donnetta Hutching. We'll start heparin with no bolus and placed on intravenous nitroglycerin and give beta blockers. She has ST depression laterally the has been labile on previous EKGs but is worse an EKG in March. If she has ongoing chest discomfort she may need to go back to the cardiac catheterization laboratory in view of the positive enzymes and the ST depression laterally. With rising enzymes and previous stent, may be best to do a limited look at the left main and circumflex circulation.  HISTORY: This 57 year-old female has a history of coronary artery disease with bypass grafting in 2005. She had repeat catheterization in April of this year was found to have a severe stenosis of the distal left main and the proximal circumflex stent that had previously been placed. At that time she had a Promus drug-eluting stent dilated to 3.5 mm placed by Dr. Martinique. She was seen by Dr. Ron Parker recently and a carotid Doppler showed a 99% right carotid artery stenosis however she was asymptomatic. She saw Dr. Kellie Simmering and had a carotid endarterectomy done yesterday while she was still on Plavix because of the recent drug-eluting stent. She has felt poorly overnight and had vomiting this morning. She complained of vague  chest pain on and off during the night and had some vague tightness in her upper neck last evening. This morning around 7:30 after vomiting she had worsening of chest discomfort currently complains of midsternal chest discomfort. A EKG shows ST depression in the lateral leads. Initial troponin was negative but a subsequent troponin is abnormal and her MB is abnormal.   Past Medical History  Diagnosis Date  . Hypercholesteremia   . Hypertension   . HYPERLIPIDEMIA 03/31/2007  . Overweight(278.02) 08/26/2009  . HYPERTENSION 03/31/2007  . CAD (coronary artery disease) 03/31/2007  . ASTHMA 08/26/2009  . Gout   . SBO (small bowel obstruction) 2011    lap lysis of adhesions & hernia repair  . Hx of CABG     2005  . Ejection fraction     Normal EF echo 2010  //   EF 60%, catheterization, April, 2014  . Anginal pain   . Shortness of breath   . Diabetes mellitus   . DIABETES MELLITUS, TYPE II 03/31/2007  . Heart murmur   . PAD (peripheral artery disease)     Dr. Kellie Simmering  . Carotid artery occlusion   . RENAL FAILURE 03/31/2007    NOT ON HD ( PCP ONLY DR Rory Percy)  . Chronic kidney disease (CKD), stage III (moderate)   . Arthritis     GOUT      Past Surgical History  Procedure Laterality Date  . Cholecystectomy  2010  . Hernia repair  1989    open w mesh  . Cesarean section  1984  . Cardiac catheterization  07/18/2004  . Cardiac stents  2006-2007  . Incisional hernia repair x2  03/04/2010     laparoscopic with 35cm mesh by Dr Ronnald Collum  . Shoulder surgery    . Coronary artery bypass graft  2005  . Coronary angioplasty with stent placement  12/19/2012    [facility]    DES     Allergies:  is allergic to penicillins.   Medications: Prior to Admission medications   Medication Sig Start Date End Date Taking? Authorizing Provider  aspirin EC 81 MG tablet Take 81 mg by mouth daily.   Yes Historical Provider, MD  atorvastatin (LIPITOR) 80 MG tablet Take 80 mg by mouth at bedtime.     Yes Historical Provider, MD  clopidogrel (PLAVIX) 75 MG tablet Take 1 tablet (75 mg total) by mouth daily. 12/20/12  Yes Rhonda G Barrett, PA-C  colchicine 0.6 MG tablet Take 0.6 mg by mouth daily as needed (for gout flares).    Yes Historical Provider, MD  furosemide (LASIX) 40 MG tablet Take 40 mg by mouth daily.  03/19/13  Yes Historical Provider, MD  glucose blood (ONE TOUCH ULTRA TEST) test strip 1 each by Other route 2 (two) times daily. And lancets 2/day 250.43 06/11/12  Yes Renato Shin, MD  insulin NPH (HUMULIN N,NOVOLIN N) 100 UNIT/ML injection Inject 150 Units into the skin daily before breakfast.   Yes Historical Provider, MD  Insulin Pen Needle 31G X 8 MM MISC Use as directed three times a day    Yes Historical Provider, MD  isosorbide dinitrate (ISORDIL) 20 MG tablet Take 20 mg by mouth at bedtime.  12/20/12  Yes Rhonda G Barrett, PA-C  nitroGLYCERIN (NITROSTAT) 0.4 MG SL tablet Place 0.4 mg under the tongue every 5 (five) minutes as needed for chest pain. Not to exceed 3 in 15 minute time frame   Yes Historical Provider, MD  quinapril (ACCUPRIL) 5 MG tablet Take 5 mg by mouth daily. 12/20/12  Yes Rhonda G Barrett, PA-C  oxyCODONE-acetaminophen (PERCOCET) 10-325 MG per tablet Take 1 tablet by mouth every 6 (six) hours as needed for pain. 04/18/13   Gabriel Earing, PA-C    Family History: Family Status  Relation Status Death Age  . Mother Alive   . Father Deceased   . Brother Alive     Social History:   reports that she quit smoking about 4 months ago. Her smoking use included Cigarettes. She has a 20 pack-year smoking history. She has never used smokeless tobacco. She reports that she does not drink alcohol or use illicit drugs.   History   Social History Narrative   Takes care of a boyfriend whom ia also disabled    Review of Systems: Other than as noted above unremarkable.  Physical Exam: BP 161/53  Pulse 88  Temp(Src) 99.1 F (37.3 C) (Oral)  Resp 20  Ht 5\' 5"  (1.651 m)   Wt 107 kg (235 lb 14.3 oz)  BMI 39.25 kg/m2  SpO2 92%  General appearance: Obese white female currently complaining of midsternal chest pain radiating through to the back Head: Normocephalic, without obvious abnormality, atraumatic Neck: Right carotid endarterectomy scar noted with drain coming inferiorly, and difficult to assess JVD Lungs: clear to auscultation bilaterally Heart: Regular rhythm, 2/6 systolic murmur Abdomen: soft, non-tender; bowel sounds normal; no masses,  no organomegaly Pelvic: deferred Extremities: 2+ edema, Pulses: 2+ and symmetric Pulses 1+ on the left Skin: Skin color, texture, turgor normal. No rashes or lesions Neurologic: Grossly normal   Labs: CBC  Recent Labs  04/19/13 0430  WBC 11.9*  RBC 3.44*  HGB 10.1*  HCT 30.4*  PLT 141*  MCV 88.4  MCH 29.4  MCHC 33.2  RDW 12.9   CMP   Recent Labs  04/19/13 0430  NA 135  K 5.1  CL 103  CO2 24  GLUCOSE 195*  BUN 32*  CREATININE 1.47*  CALCIUM 9.3  GFRNONAA 39*  GFRAA 46*   Cardiac Panel (last 3 results)  Recent Labs  04/18/13 2216 04/19/13 0430  CKTOTAL 102 169  CKMB 2.5 16.1*  TROPONINI <0.30 1.57*  RELINDX 2.5 9.5*     Radiology: Chest x-ray preop no acute disease  EKG: Sinus rhythm, ST depression in the lateral leads that appears worse than previous EKG  Signed:  W. Doristine Church MD [facility]   Cardiology Consultant  04/19/2013, 8:37 AM

## 2013-04-19 NOTE — Progress Notes (Signed)
ANTICOAGULATION CONSULT NOTE - Initial Consult  Pharmacy Consult for Heparin Indication: chest pain/ACS  Allergies  Allergen Reactions  . Penicillins     REACTION: Unknown, told as a child    Patient Measurements: Height: 5\' 5"  (165.1 cm) Weight: 235 lb 14.3 oz (107 kg) IBW/kg (Calculated) : 57 Heparin Dosing Weight: 82 kg   Vital Signs: Temp: 98.1 F (36.7 C) (08/02 0800) Temp src: Oral (08/02 0800) BP: 166/49 mmHg (08/02 0800) Pulse Rate: 92 (08/02 0800)  Labs:  Recent Labs  04/18/13 2216 04/19/13 0430  HGB  --  10.1*  HCT  --  30.4*  PLT  --  141*  CREATININE  --  1.47*  CKTOTAL 102 169  CKMB 2.5 16.1*  TROPONINI <0.30 1.57*    Estimated Creatinine Clearance: 53.2 ml/min (by C-G formula based on Cr of 1.47).  Medical History: Past Medical History  Diagnosis Date  . Hypercholesteremia   . Hypertension   . HYPERLIPIDEMIA 03/31/2007  . Overweight(278.02) 08/26/2009  . HYPERTENSION 03/31/2007  . CAD (coronary artery disease) 03/31/2007  . ASTHMA 08/26/2009  . Gout   . SBO (small bowel obstruction) 2011    lap lysis of adhesions & hernia repair  . Hx of CABG     2005  . Ejection fraction     Normal EF echo 2010  //   EF 60%, catheterization, April, 2014  . Anginal pain   . Shortness of breath   . Diabetes mellitus   . DIABETES MELLITUS, TYPE II 03/31/2007  . Heart murmur   . PAD (peripheral artery disease)     Dr. Kellie Simmering  . Carotid artery occlusion   . RENAL FAILURE 03/31/2007    NOT ON HD ( PCP ONLY DR Rory Percy)  . Chronic kidney disease (CKD), stage III (moderate)   . Arthritis     GOUT    Medications:  Prescriptions prior to admission  Medication Sig Dispense Refill  . aspirin EC 81 MG tablet Take 81 mg by mouth daily.      Marland Kitchen atorvastatin (LIPITOR) 80 MG tablet Take 80 mg by mouth at bedtime.       . clopidogrel (PLAVIX) 75 MG tablet Take 1 tablet (75 mg total) by mouth daily.  30 tablet  11  . colchicine 0.6 MG tablet Take 0.6 mg by mouth  daily as needed (for gout flares).       . furosemide (LASIX) 40 MG tablet Take 40 mg by mouth daily.       Marland Kitchen glucose blood (ONE TOUCH ULTRA TEST) test strip 1 each by Other route 2 (two) times daily. And lancets 2/day 250.43  100 each  12  . insulin NPH (HUMULIN N,NOVOLIN N) 100 UNIT/ML injection Inject 150 Units into the skin daily before breakfast.      . Insulin Pen Needle 31G X 8 MM MISC Use as directed three times a day       . isosorbide dinitrate (ISORDIL) 20 MG tablet Take 20 mg by mouth at bedtime.       . nitroGLYCERIN (NITROSTAT) 0.4 MG SL tablet Place 0.4 mg under the tongue every 5 (five) minutes as needed for chest pain. Not to exceed 3 in 15 minute time frame      . quinapril (ACCUPRIL) 5 MG tablet Take 5 mg by mouth daily.      . [DISCONTINUED] oxyCODONE-acetaminophen (PERCOCET) 10-325 MG per tablet Take 1 tablet by mouth every 4 (four) hours as needed for pain.  Assessment: 57 yo F with STEMI s/p carotid endarterectomy. Pharmacy consulted to initiate heparin drip without bolus. Hgb 10.1 plt 141 INR 1.07  Goal of Therapy:  Heparin level 0.3-0.7 units/ml Monitor platelets by anticoagulation protocol: Yes   Plan:  - Initiate heparin 1000 units/hr. No bolus per MD directions. - Check heparin level in 6 hours - Daily heparin levels/CBC  Donna Howe, PharmD Clinical Pharmacist-Resident Pager: [phone] Pharmacy: [phone] 04/19/2013 9:12 AM

## 2013-04-19 NOTE — Progress Notes (Signed)
Nursing 04/18/13 2030  Pt c/o chest pain, 7/10.  Morphine 2 mg given.  B/P remains stable but high.  Pain unrelieved.  Morphine 2mg  IV repeated.  Dr. Donnetta Hutching paged.  Rapid response paged.  EKG done.  Serial enzymes ordered.  Foley ordered and inserted.  Will continue to monitor pt.  Pt rates pain 4-5/10.

## 2013-04-19 NOTE — Progress Notes (Signed)
Nursing 0530 Lab called with positive enzymes on second set of cardiac enzymes....MB 16.1, Troponin 1.57.  Dr. Donnetta Hutching notified.  No orders received.    Pt c/o nausea.  Zofran 4 mg iv given.  Pt vomited lg amt of undigested food.    0730 Pt began to c/o of chest pain, 7-8/10.  Day nurse assessing pt.

## 2013-04-20 LAB — CBC
MCH: 29 pg (ref 26.0–34.0)
MCHC: 32.8 g/dL (ref 30.0–36.0)
MCV: 88.4 fL (ref 78.0–100.0)
Platelets: 121 10*3/uL — ABNORMAL LOW (ref 150–400)
RBC: 3.52 MIL/uL — ABNORMAL LOW (ref 3.87–5.11)
RDW: 12.6 % (ref 11.5–15.5)

## 2013-04-20 LAB — TYPE AND SCREEN: Unit division: 0

## 2013-04-20 LAB — GLUCOSE, CAPILLARY: Glucose-Capillary: 91 mg/dL (ref 70–99)

## 2013-04-20 LAB — BASIC METABOLIC PANEL
BUN: 29 mg/dL — ABNORMAL HIGH (ref 6–23)
CO2: 26 mEq/L (ref 19–32)
Calcium: 9.5 mg/dL (ref 8.4–10.5)
Creatinine, Ser: 1.37 mg/dL — ABNORMAL HIGH (ref 0.50–1.10)
GFR calc non Af Amer: 43 mL/min — ABNORMAL LOW (ref >90)
Glucose, Bld: 225 mg/dL — ABNORMAL HIGH (ref 70–99)

## 2013-04-20 MED ORDER — METOPROLOL TARTRATE 25 MG PO TABS
25.0000 mg | ORAL_TABLET | Freq: Two times a day (BID) | ORAL | Status: DC
  Administered 2013-04-20 (×2): 25 mg via ORAL
  Filled 2013-04-20 (×4): qty 1

## 2013-04-20 NOTE — Progress Notes (Signed)
Nursing Pt to transfer to 2029, attempted to call report to [place], RN.  RN on another line.  Contact information left.

## 2013-04-20 NOTE — Discharge Summary (Signed)
[facility] Discharge Summary  Donna Howe [birth year] 57 y.o. female  [id number]  Admission Date: 04/18/2013  Discharge Date: 04/21/13  Physician: Mal Misty, MD  Admission Diagnosis: RIGHT ICA STENOSIS Non STEMI   HPI:   This is a 57 y.o. female is known to me having been evaluated for claudication in the left leg a few months ago. She denies any neurologic symptoms such as previous stroke, lateralizing weakness, aphasia, amaurosis fugax, diplopia, blurred vision, or syncope. She does have her right carotid bruit and a carotid duplex exam was recently performed which revealed a 99+ percent right ICA stenosis. This was ordered by Dr. Daryel November. Patient had cardiac catheterization in April of this year with cardiac stents placed and is currently quite stable from a cardiac standpoint according to Dr. Ron Parker. She has a good ejection fraction. He does take Plavix.  Hospital Course:  The patient was admitted to the hospital and taken to the operating room on 04/18/2013 - 04/19/2013 and underwent right carotid endarterectomy.  The pt tolerated the procedure well and was transported to the PACU in good condition.   By POD 1, the pt neuro status was in tact with a marginal mandibular neuropraxia.  She did have chest pain over night.  Her initial troponin and EKG were normal and unchanged respectively.  By the morning, her troponin had bumped and her EKG was changed.  A cardiology consult was obtained.  She was placed on heparin and NTG and taken to the cardiac catheterization lab, where she was found to have unchanged anatomy from her prior heart cath.  Her RCA vein graft is patent as is her LIMA to her LAD. The protected left main/proximal dominant circumflex stent is widely patent as well. I'm not sure the etiology of her chest pain or her mildly elevated troponins and ST segment depression though I cannot find the culprit vessel. Continued medical therapy will be recommended.    By POD 2, her heparin and NTG were discontinued and she was not having anymore chest pain.  She was started on a beta blocker and kept for another day.  She continued to be pain free and was discharged home.  The remainder of the hospital course consisted of increasing mobilization and increasing intake of solids without difficulty.    Recent Labs  04/19/13 0430  NA 135  K 5.1  CL 103  CO2 24  GLUCOSE 195*  BUN 32*  CALCIUM 9.3    Recent Labs  04/19/13 0430 04/20/13 0539  WBC 11.9* 10.6*  HGB 10.1* 10.2*  HCT 30.4* 31.1*  PLT 141* 121*   No results found for this basename: INR,  in the last 72 hours  Discharge Instructions:   The patient is discharged to home with extensive instructions on wound care and progressive ambulation.  They are instructed not to drive or perform any heavy lifting until returning to see the physician in his office.  Discharge Orders   Future Appointments Provider Department Dept Phone   04/29/2013 10:30 AM Renato Shin, MD [facility] [phone]   05/06/2013 2:45 PM Mal Misty, MD [facility] [phone]   Future Orders Complete By Expires     CAROTID Sugery: Call MD for difficulty swallowing or speaking; weakness in arms or legs that is a new symtom; severe headache.  If you have increased swelling in the neck and/or  are having difficulty breathing, CALL 911  As directed  Call MD for:  redness, tenderness, or signs of infection (pain, swelling, bleeding, redness, odor or green/yellow discharge around incision site)  As directed     Call MD for:  severe or increased pain, loss or decreased feeling  in affected limb(s)  As directed     Call MD for:  temperature >100.5  As directed     Discharge wound care:  As directed     Comments:      Shower daily with soap and water starting 04/20/13    Driving Restrictions  As directed     Comments:      No driving for 2 weeks    Lifting  restrictions  As directed     Comments:      No lifting for 4 weeks    Nursing communication  As directed     Scheduling Instructions:      Please give paper Rx to patient at discharge.  It is located on the paper chart.    Resume previous diet  As directed        Discharge Diagnosis:  RIGHT ICA STENOSIS Non STEMI  Secondary Diagnosis: Patient Active Problem List   Diagnosis Date Noted  . Occlusion and stenosis of carotid artery without mention of cerebral infarction 04/15/2013  . Hx of CABG   . Ejection fraction   . Atherosclerosis of native arteries of the extremities with intermittent claudication 02/11/2013  . Diabetes with renal manifestations(250.4) 01/21/2013  . Bradycardia 01/03/2013  . Chronic kidney disease (CKD), stage III (moderate)   . Gout   . PROTEINURIA, MILD 01/18/2010  . TOBACCO ABUSE 08/27/2009  . Obesity (BMI 30-39.9) 08/26/2009  . ASTHMA 08/26/2009  . HYPERLIPIDEMIA 03/31/2007  . HYPERTENSION 03/31/2007  . CAD (coronary artery disease) 03/31/2007   Past Medical History  Diagnosis Date  . Hypercholesteremia   . Hypertension   . HYPERLIPIDEMIA 03/31/2007  . Overweight(278.02) 08/26/2009  . HYPERTENSION 03/31/2007  . CAD (coronary artery disease) 03/31/2007  . ASTHMA 08/26/2009  . Gout   . SBO (small bowel obstruction) 2011    lap lysis of adhesions & hernia repair  . Hx of CABG     2005  . Ejection fraction     Normal EF echo 2010  //   EF 60%, catheterization, April, 2014  . Anginal pain   . Shortness of breath   . Diabetes mellitus   . DIABETES MELLITUS, TYPE II 03/31/2007  . Heart murmur   . PAD (peripheral artery disease)     Dr. Kellie Simmering  . Carotid artery occlusion   . RENAL FAILURE 03/31/2007    NOT ON HD ( PCP ONLY DR Rory Percy)  . Chronic kidney disease (CKD), stage III (moderate)   . Arthritis     GOUT      Medication List         aspirin EC 81 MG tablet  Take 81 mg by mouth daily.     atorvastatin 80 MG tablet  Commonly  known as:  LIPITOR  Take 80 mg by mouth at bedtime.     clopidogrel 75 MG tablet  Commonly known as:  PLAVIX  Take 1 tablet (75 mg total) by mouth daily.     colchicine 0.6 MG tablet  Take 0.6 mg by mouth daily as needed (for gout flares).     furosemide 40 MG tablet  Commonly known as:  LASIX  Take 40 mg by mouth daily.     glucose blood test strip  Commonly known as:  ONE TOUCH ULTRA TEST  1 each by Other route 2 (two) times daily. And lancets 2/day 250.43     insulin NPH 100 UNIT/ML injection  Commonly known as:  HUMULIN N,NOVOLIN N  Inject 150 Units into the skin daily before breakfast.     Insulin Pen Needle 31G X 8 MM Misc  Use as directed three times a day     isosorbide dinitrate 20 MG tablet  Commonly known as:  ISORDIL  Take 20 mg by mouth at bedtime.     nitroGLYCERIN 0.4 MG SL tablet  Commonly known as:  NITROSTAT  Place 0.4 mg under the tongue every 5 (five) minutes as needed for chest pain. Not to exceed 3 in 15 minute time frame     oxyCODONE-acetaminophen 10-325 MG per tablet  Commonly known as:  PERCOCET  Take 1 tablet by mouth every 6 (six) hours as needed for pain.     quinapril 5 MG tablet  Commonly known as:  ACCUPRIL  Take 5 mg by mouth daily.        Roxicodone #30 No Refill  Disposition: home  Patient's condition: is Good  Follow up: 1. Dr.  Kellie Simmering in 2 weeks. 2. Dr. Ron Parker in 1 week.  Leontine Locket, PA-C [facility] [phone]  --- For [facility] use --- Instructions: Press F2 to tab through selections.  Delete question if not applicable.   Modified Rankin score at D/C (0-6): 0  IV medication needed for:  1. Hypertension: Yes-NTG in OR and POD 1 for CP 2. Hypotension: No  Post-op Complications: Yes  1. Post-op CVA or TIA: No  If yes: Event classification (right eye, left eye, right cortical, left cortical, verterobasilar, other): n/a  If yes: Timing of event (intra-op, <6 hrs post-op, >=6 hrs  post-op, unknown): n/a  2. CN injury: No  If yes: CN n/a injuried   3. Myocardial infarction: Yes  If yes: Dx by (EKG or clinical, Troponin): EKG/Troponin (NSTEMI)  4.  CHF: No  5.  Dysrhythmia (new): No  6. Wound infection: No  7. Reperfusion symptoms: No  8. Return to OR: No  If yes: return to OR for (bleeding, neurologic, other CEA incision, other): n/a  Discharge medications: Statin use:  Yes If No: [ ]  For Medical reasons, [ ]  Non-compliant, [ ]  Not-indicated ASA use:  Yes  If No: [ ]  For Medical reasons, [ ]  Non-compliant, [ ]  Not-indicated Beta blocker use:  yes If No: [ ]  For Medical reasons, [ ]  Non-compliant, [ ]  Not-indicated ACE-Inhibitor use:  Yes If No: [ ]  For Medical reasons, [ ]  Non-compliant, [ ]  Not-indicated P2Y12 Antagonist use: yes, [ x] Plavix, [ ]  Plasugrel, [ ]  Ticlopinine, [ ]  Ticagrelor, [ ]  Other, [ ]  No for medical reason, [ ]  Non-compliant, [ ]  Not-indicated Anti-coagulant use:  no, [ ]  Warfarin, [ ]  Rivaroxaban, [ ]  Dabigatran, [ ]  Other, [ ]  No for medical reason, [ ]  Non-compliant, [ ]  Not-indicated

## 2013-04-20 NOTE — Progress Notes (Signed)
Subjective:  Cath results noted.  She had elevation of Troponin and MB likely due to NSTEMI from unprotected first OM branch.  The stent was patent.    Feels fine today.  No recurrent chest pain or nausea.  Objective:  Vital Signs in the last 24 hours: BP 143/55  Pulse 101  Temp(Src) 98.9 F (37.2 C) (Oral)  Resp 24  Ht 5\' 5"  (1.651 m)  Wt 107 kg (235 lb 14.3 oz)  BMI 39.25 kg/m2  SpO2 91%  Physical Exam: Pleasant obese WF in NAD Lungs:  Clear Cardiac:  Regular rhythm, normal S1 and S2, no S3, 2/6 murmur Abdomen:  Soft, nontender, no masses Extremities:  No edema present  Intake/Output from previous day: 08/02 0701 - 08/03 0700 In: 1155 [P.O.:480; I.V.:675] Out: 2640 [Urine:2600; Drains:40]  Weight Filed Weights   04/18/13 1729  Weight: 107 kg (235 lb 14.3 oz)    Lab Results: Basic Metabolic Panel:  Recent Labs  04/19/13 0430  NA 135  K 5.1  CL 103  CO2 24  GLUCOSE 195*  BUN 32*  CREATININE 1.47*   CBC:  Recent Labs  04/19/13 0430 04/20/13 0539  WBC 11.9* 10.6*  HGB 10.1* 10.2*  HCT 30.4* 31.1*  MCV 88.4 88.4  PLT 141* 121*   Cardiac Enzymes:  Recent Labs  04/18/13 2216 04/19/13 0430 04/19/13 1025 04/19/13 1420 04/19/13 2115  CKTOTAL 102 169 191*  --   --   CKMB 2.5 16.1* 17.9*  --   --   TROPONINI <0.30 1.57* 2.80* 3.53* 3.51*    Telemetry: Sinus rate around 90  Assessment/Plan:  1. NSTEMI likely due to stress of CEA probably came from unprotected first OM with 99% stenosis 2. Recent CEA CAD 3. Stage 3 CKD  Rec:  Add beta blocker. Ambulate.  I would keep one additional day to be sure stable.  Recheck renal function today.      Kerry Hough  MD [facility]  04/20/2013, 9:09 AM

## 2013-04-20 NOTE — Progress Notes (Addendum)
[facility] Progress Note  04/20/2013 7:27 AM 1 Day Post-Op  Subjective:  No complaints; denies any chest pain   Tm 99.3 now afebrile 0000000 systolic HR 99991111 NSR 95% RA  Filed Vitals:   04/20/13 0328  BP: 145/59  Pulse: 99  Temp: 98.2 F (36.8 C)  Resp: 22     Physical Exam: Neuro:  In tact.  She does have a marginal mandibular neuropraxia  Incision:  C/d/i  CBC    Component Value Date/Time   WBC 10.6* 04/20/2013 0539   RBC 3.52* 04/20/2013 0539   HGB 10.2* 04/20/2013 0539   HCT 31.1* 04/20/2013 0539   PLT 121* 04/20/2013 0539   MCV 88.4 04/20/2013 0539   MCH 29.0 04/20/2013 0539   MCHC 32.8 04/20/2013 0539   RDW 12.6 04/20/2013 0539   LYMPHSABS 1.7 03/06/2010 1100   MONOABS 1.0 03/06/2010 1100   EOSABS 0.1 03/06/2010 1100   BASOSABS 0.0 03/06/2010 1100    BMET    Component Value Date/Time   NA 135 04/19/2013 0430   K 5.1 04/19/2013 0430   CL 103 04/19/2013 0430   CO2 24 04/19/2013 0430   GLUCOSE 195* 04/19/2013 0430   BUN 32* 04/19/2013 0430   CREATININE 1.47* 04/19/2013 0430   CALCIUM 9.3 04/19/2013 0430   GFRNONAA 39* 04/19/2013 0430   GFRAA 46* 04/19/2013 0430     Intake/Output Summary (Last 24 hours) at 04/20/13 0727 Last data filed at 04/20/13 0545  Gross per 24 hour  Intake   1155 ml  Output   2640 ml  Net  -1485 ml   JP drain output / 24hrs:  40 cc   Assessment/Plan:  This is a 57 y.o. female who is s/p right CEA 1 Day Post-Op  -pt is doing well this am.  She has not had anymore CP.  Possibly d/c today depending on cardiology recommendations.  Heparin and NTG is off. -pt neuro exam is in tact -pt has not ambulated-will need to ambulate pt today as she has not ambulated since surgery-only OOB to chair -foley is out and she has voided. -discontinue JP   Leontine Locket, PA-C [facility] [phone] I have examined the patient, reviewed and agree with above. Appreciate cardiology's assistance. Cath results noted. We'll transfer  to unit 2000 with plan for discharge in a.m.  Jody Aguinaga, MD 04/20/2013 10:33 AM

## 2013-04-21 DIAGNOSIS — I6529 Occlusion and stenosis of unspecified carotid artery: Principal | ICD-10-CM

## 2013-04-21 LAB — CBC
Hemoglobin: 9.1 g/dL — ABNORMAL LOW (ref 12.0–15.0)
MCH: 29.4 pg (ref 26.0–34.0)
MCHC: 33.7 g/dL (ref 30.0–36.0)

## 2013-04-21 LAB — GLUCOSE, CAPILLARY
Glucose-Capillary: 126 mg/dL — ABNORMAL HIGH (ref 70–99)
Glucose-Capillary: 200 mg/dL — ABNORMAL HIGH (ref 70–99)

## 2013-04-21 LAB — POCT ACTIVATED CLOTTING TIME: Activated Clotting Time: 160 seconds

## 2013-04-21 MED ORDER — METOPROLOL TARTRATE 12.5 MG HALF TABLET: 12.5000 mg | ORAL_TABLET | Freq: Two times a day (BID) | ORAL | Status: DC

## 2013-04-21 MED ORDER — METOPROLOL TARTRATE 12.5 MG HALF TABLET
12.5000 mg | ORAL_TABLET | Freq: Two times a day (BID) | ORAL | Status: DC
  Administered 2013-04-21: 12.5 mg via ORAL
  Filled 2013-04-21 (×2): qty 1

## 2013-04-21 NOTE — Progress Notes (Addendum)
04/21/13 Patient had ambulated in hallway this AM with cardiac rehab, discharge instructions, AVS and patients prescriptions sent to patients pharmacy reviewed with patient. One changed medication pain medication patient does have at home. All questions answered. Will discharge home as ordered. Karel Turpen, Bettina Gavia RN

## 2013-04-21 NOTE — Progress Notes (Signed)
CARDIAC REHAB PHASE I   PRE:  Rate/Rhythm: 81SR  BP:  Supine:   Sitting: 100/60  Standing:    SaO2: 97%RA  MODE:  Ambulation: 500 ft   POST:  Rate/Rhythm: 85SR  BP:  Supine:   Sitting: 160/60  Standing:    SaO2: 100%RA [id number] Pt walked 500 ft on RA with hand held asst. Denied CP. Stopped once to rest due to leg tiredness. To sitting on side of bed after walk. Education completed re MI. Pt states she loves to eat fried foods and is very picky in what she likes. She does not count carbs. Reviewed carb counting again with pt. She was educated by [place] in April when she had her stent. Pt has not begun CRP 2 due to her mother breaking a hip and her taking care of her. Agreed for referral to [facility] Phase 2 again.    Graylon Good, RN BSN  04/21/2013 9:15 AM

## 2013-04-21 NOTE — Progress Notes (Addendum)
[facility] Progress Note  04/21/2013 7:19 AM 2 Days Post-Op  Subjective:  No complaints-no further chest pain  Tm 99 now afebrile HR 70's-100's [id number] systolic 99991111 RA  Filed Vitals:   04/21/13 0452  BP: 99/64  Pulse: 70  Temp: 98.4 F (36.9 C)  Resp: 20     Physical Exam: Neuro:  In tact Incision:  C/d/i  CBC    Component Value Date/Time   WBC 9.3 04/21/2013 0520   RBC 3.09* 04/21/2013 0520   HGB 9.1* 04/21/2013 0520   HCT 27.0* 04/21/2013 0520   PLT 133* 04/21/2013 0520   MCV 87.4 04/21/2013 0520   MCH 29.4 04/21/2013 0520   MCHC 33.7 04/21/2013 0520   RDW 12.6 04/21/2013 0520   LYMPHSABS 1.7 03/06/2010 1100   MONOABS 1.0 03/06/2010 1100   EOSABS 0.1 03/06/2010 1100   BASOSABS 0.0 03/06/2010 1100    BMET    Component Value Date/Time   NA 134* 04/20/2013 1050   K 4.8 04/20/2013 1050   CL 97 04/20/2013 1050   CO2 26 04/20/2013 1050   GLUCOSE 225* 04/20/2013 1050   BUN 29* 04/20/2013 1050   CREATININE 1.37* 04/20/2013 1050   CALCIUM 9.5 04/20/2013 1050   GFRNONAA 43* 04/20/2013 1050   GFRAA 50* 04/20/2013 1050     Intake/Output Summary (Last 24 hours) at 04/21/13 0719 Last data filed at 04/20/13 1937  Gross per 24 hour  Intake    840 ml  Output      0 ml  Net    840 ml      Assessment/Plan:  This is a 57 y.o. female who is s/p right CEA 2 Days Post-Op  -pt is doing well this am. -pt neuro exam is in tact -pt has ambulated to the bathroom and back -cards to see this am-will discharge after cards sees pt for discharge meds instructions.   Leontine Locket, PA-C [facility] [phone]  I have examined the patient, reviewed and agree with above.  EARLY, TODD, MD 04/21/2013 10:10 AM

## 2013-04-21 NOTE — Progress Notes (Signed)
   Subjective:  Feels well today. No further chest pain. Rhythm NSR. BP soft.Has not walked in hall yet.  Objective:  Vital Signs in the last 24 hours: Temp:  [98.2 F (36.8 C)-99 F (37.2 C)] 98.4 F (36.9 C) (08/04 0452) Pulse Rate:  [70-101] 70 (08/04 0452) Resp:  [16-24] 20 (08/04 0452) BP: (91-144)/(43-64) 99/64 mmHg (08/04 0452) SpO2:  [91 %-100 %] 95 % (08/04 0452) Weight:  [234 lb 11.2 oz (106.459 kg)] 234 lb 11.2 oz (106.459 kg) (08/03 2154)  Intake/Output from previous day: 08/03 0701 - 08/04 0700 In: 840 [P.O.:840] Out: 0  Intake/Output from this shift:    . aspirin EC  81 mg Oral Daily  . atorvastatin  80 mg Oral QHS  . clopidogrel  75 mg Oral Daily  . docusate sodium  100 mg Oral Daily  . furosemide  40 mg Oral Daily  . insulin aspart  0-15 Units Subcutaneous TID WC  . insulin NPH  150 Units Subcutaneous QAC breakfast  . lisinopril  5 mg Oral Daily  . metoprolol tartrate  25 mg Oral BID  . pantoprazole  40 mg Oral Daily   . nitroGLYCERIN 5 mcg/min (04/19/13 [id number])    Physical Exam: The patient appears to be in no distress.  Head and neck exam reveals that the pupils are equal and reactive.  The extraocular movements are full.  There is no scleral icterus.  Mouth and pharynx are benign.  No lymphadenopathy.  No carotid bruits.  The jugular venous pressure is normal.  Thyroid is not enlarged or tender.  Chest is clear to percussion and auscultation.  No rales or rhonchi.  Expansion of the chest is symmetrical.  Heart reveals soft systolic murmur at base.  The abdomen is soft and nontender.  Bowel sounds are normoactive.  There is no hepatosplenomegaly or mass.  There are no abdominal bruits.  Extremities reveal no phlebitis or edema.  Pedal pulses are good.  There is no cyanosis or clubbing.  Neurologic exam is normal strength and no lateralizing weakness.  No sensory deficits.  Integument reveals no rash  Lab Results:  Recent Labs  04/20/13 0539  04/21/13 0520  WBC 10.6* 9.3  HGB 10.2* 9.1*  PLT 121* 133*    Recent Labs  04/19/13 0430 04/20/13 1050  NA 135 134*  K 5.1 4.8  CL 103 97  CO2 24 26  GLUCOSE 195* 225*  BUN 32* 29*  CREATININE 1.47* 1.37*    Recent Labs  04/19/13 1420 04/19/13 2115  TROPONINI 3.53* 3.51*   Hepatic Function Panel No results found for this basename: PROT, ALBUMIN, AST, ALT, ALKPHOS, BILITOT, BILIDIR, IBILI,  in the last 72 hours No results found for this basename: CHOL,  in the last 72 hours No results found for this basename: PROTIME,  in the last 72 hours  Imaging: Imaging results have been reviewed  Cardiac Studies: Telemetry reveals NSR Assessment/Plan:  1. NSTEMI likely due to stress of CEA probably came from unprotected first OM with 99% stenosis  2. Recent CEA  3. Stage 3 CKD  Plan: Will reduce metoprolol to 12.5 mg BID because of soft BP.          Ambulate.           Should probably be able to go home today. Followup with Dr. Ron Parker in office.  LOS: 3 days    Darlin Coco 04/21/2013, 7:43 AM

## 2013-04-22 ENCOUNTER — Encounter (HOSPITAL_COMMUNITY): Payer: Self-pay | Admitting: Vascular Surgery

## 2013-04-26 ENCOUNTER — Encounter: Payer: Self-pay | Admitting: Cardiology

## 2013-04-26 DIAGNOSIS — I779 Disorder of arteries and arterioles, unspecified: Secondary | ICD-10-CM | POA: Insufficient documentation

## 2013-04-26 DIAGNOSIS — I739 Peripheral vascular disease, unspecified: Secondary | ICD-10-CM

## 2013-04-28 ENCOUNTER — Encounter: Payer: Self-pay | Admitting: Cardiology

## 2013-04-28 ENCOUNTER — Ambulatory Visit (INDEPENDENT_AMBULATORY_CARE_PROVIDER_SITE_OTHER): Payer: Medicare Other | Admitting: Cardiology

## 2013-04-28 VITALS — BP 114/68 | HR 62 | Ht 64.5 in | Wt 233.0 lb

## 2013-04-28 DIAGNOSIS — I2510 Atherosclerotic heart disease of native coronary artery without angina pectoris: Secondary | ICD-10-CM

## 2013-04-28 DIAGNOSIS — K429 Umbilical hernia without obstruction or gangrene: Secondary | ICD-10-CM

## 2013-04-28 DIAGNOSIS — I779 Disorder of arteries and arterioles, unspecified: Secondary | ICD-10-CM

## 2013-04-28 NOTE — Assessment & Plan Note (Signed)
The patient has an umbilical hernia. In March, 2014, there was consideration of surgery. However treatment of her cardiac status and her carotid status has put that issue on hold. This will need to be eventually revisited.

## 2013-04-28 NOTE — Assessment & Plan Note (Addendum)
The patient's cardiac status is stable. She remains on aspirin Plavix after her drug-eluting stent April, 2014. She is on high dose atorvastatin

## 2013-04-28 NOTE — Assessment & Plan Note (Addendum)
She is status post right carotid endarterectomy. She had a very tight stenosis. Surgeries done on aspirin and Plavix. She has slight weakness of the right facial muscle. Hopefully this will normalize.  She is on high-dose atorvastatin

## 2013-04-28 NOTE — Patient Instructions (Addendum)

## 2013-04-28 NOTE — Progress Notes (Signed)
HPI  Patient is seen today to followup coronary disease. I saw her last in the office April 17, 2013. She mentioned to me that she was having a problem with some intermittent weakness in her left leg. Etiology was not completely clear. We decided to proceed with carotid Doppler. She had severe disease in her right carotid. We were immediately in touch with Dr. Kellie Simmering who arranged for right carotid endarterectomy. She is now here postop. She has some soreness in her neck. There is very slight drooping in the right aspect of her mouth.  She's not had any significant chest pain.  Allergies  Allergen Reactions  . Penicillins     REACTION: Unknown, told as a child    Current Outpatient Prescriptions  Medication Sig Dispense Refill  . aspirin EC 81 MG tablet Take 81 mg by mouth daily.      Marland Kitchen atorvastatin (LIPITOR) 80 MG tablet Take 80 mg by mouth daily.      . clopidogrel (PLAVIX) 75 MG tablet Take 1 tablet (75 mg total) by mouth daily.  30 tablet  11  . colchicine 0.6 MG tablet Take 0.6 mg by mouth daily as needed (for gout flares).       . furosemide (LASIX) 40 MG tablet Take 40 mg by mouth daily.       Marland Kitchen glucose blood (ONE TOUCH ULTRA TEST) test strip 1 each by Other route 2 (two) times daily. And lancets 2/day 250.43  100 each  12  . insulin NPH (HUMULIN N,NOVOLIN N) 100 UNIT/ML injection Inject 150 Units into the skin daily before breakfast.      . Insulin Pen Needle 31G X 8 MM MISC Use as directed three times a day       . isosorbide dinitrate (ISORDIL) 20 MG tablet Take 20 mg by mouth at bedtime.       . metoprolol tartrate (LOPRESSOR) 12.5 mg TABS tablet Take 0.5 tablets (12.5 mg total) by mouth 2 (two) times daily.  60 tablet  1  . nitroGLYCERIN (NITROSTAT) 0.4 MG SL tablet Place 0.4 mg under the tongue every 5 (five) minutes as needed for chest pain. Not to exceed 3 in 15 minute time frame      . oxyCODONE-acetaminophen (PERCOCET) 10-325 MG per tablet Take 1 tablet by mouth every 6  (six) hours as needed for pain.  30 tablet  0  . quinapril (ACCUPRIL) 5 MG tablet Take 5 mg by mouth daily.       No current facility-administered medications for this visit.    History   Social History  . Marital Status: Divorced    Spouse Name: N/A    Number of Children: N/A  . Years of Education: N/A   Occupational History  . Disabled    Social History Main Topics  . Smoking status: Former Smoker -- 1.00 packs/day for 20 years    Types: Cigarettes    Quit date: 12/10/2012  . Smokeless tobacco: Never Used  . Alcohol Use: No  . Drug Use: No  . Sexually Active: Not on file   Other Topics Concern  . 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 Diabetes Mother   . Heart disease Mother   . Hyperlipidemia Mother   . Hypertension Mother   . Thyroid disease Father   . Hypertension Father   .  AAA (abdominal aortic aneurysm) Father   . Heart disease Brother   . Hyperlipidemia Son   . Hypertension Son     Past Medical History  Diagnosis Date  . Hypercholesteremia   . Hypertension   . HYPERLIPIDEMIA 03/31/2007  . Overweight(278.02) 08/26/2009  . HYPERTENSION 03/31/2007  . CAD (coronary artery disease) 03/31/2007  . ASTHMA 08/26/2009  . Gout   . SBO (small bowel obstruction) 2011    lap lysis of adhesions & hernia repair  . Hx of CABG     2005  . Ejection fraction     Normal EF echo 2010  //   EF 60%, catheterization, April, 2014  . Anginal pain   . Shortness of breath   . Diabetes mellitus   . DIABETES MELLITUS, TYPE II 03/31/2007  . Heart murmur   . PAD (peripheral artery disease)     Dr. Kellie Simmering  . Carotid artery occlusion   . RENAL FAILURE 03/31/2007    NOT ON HD ( PCP ONLY DR Rory Percy)  . Chronic kidney disease (CKD), stage III (moderate)   . Arthritis     GOUT  . Carotid artery disease     Past Surgical History  Procedure Laterality Date  .  Cholecystectomy  2010  . Hernia repair  1989    open w mesh  . Cesarean section  1984  . Cardiac catheterization  07/18/2004  . Cardiac stents  2006-2007  . Incisional hernia repair x2  03/04/2010     laparoscopic with 35cm mesh by Dr Ronnald Collum  . Shoulder surgery    . Coronary artery bypass graft  2005  . Coronary angioplasty with stent placement  12/19/2012    [facility]    DES  . Endarterectomy Right 04/18/2013    Procedure: ENDARTERECTOMY CAROTID;  Surgeon: Mal Misty, MD;  Location: [place];  Service: Vascular;  Laterality: Right;  . Patch angioplasty Right 04/18/2013    Procedure: PATCH ANGIOPLASTY Right Internal Carotid Artery;  Surgeon: Mal Misty, MD;  Location: [place];  Service: Vascular;  Laterality: Right;    Patient Active Problem List   Diagnosis Date Noted  . Carotid artery disease   . Occlusion and stenosis of carotid artery without mention of cerebral infarction 04/15/2013  . Hx of CABG   . Ejection fraction   . Atherosclerosis of native arteries of the extremities with intermittent claudication 02/11/2013  . Diabetes with renal manifestations(250.4) 01/21/2013  . Bradycardia 01/03/2013  . Chronic kidney disease (CKD), stage III (moderate)   . Gout   . PROTEINURIA, MILD 01/18/2010  . TOBACCO ABUSE 08/27/2009  . Obesity (BMI 30-39.9) 08/26/2009  . ASTHMA 08/26/2009  . HYPERLIPIDEMIA 03/31/2007  . HYPERTENSION 03/31/2007  . CAD (coronary artery disease) 03/31/2007    ROS   Patient denies fever, chills, headache, sweats, rash, change in vision, change in hearing, chest pain, cough, nausea vomiting, urinary symptoms. All other systems are reviewed and are negative.  PHYSICAL EXAM  Patient is oriented to person time and place. Affect is normal. Her right carotid endarterectomy scar is healing nicely. There is slight right facial weakness. Lungs are clear. Respiratory effort is nonlabored. Cardiac exam reveals S1 and S2. There no clicks or significant murmurs.  Abdomen is soft. There is no peripheral edema.  Filed Vitals:   04/28/13 1031  BP: 114/68  Pulse: 62  Height: 5' 4.5" (1.638 m)  Weight: 233 lb (105.688 kg)     ASSESSMENT & PLAN

## 2013-04-29 ENCOUNTER — Encounter: Payer: Self-pay | Admitting: Endocrinology

## 2013-04-29 ENCOUNTER — Ambulatory Visit (INDEPENDENT_AMBULATORY_CARE_PROVIDER_SITE_OTHER): Payer: Medicare Other | Admitting: Endocrinology

## 2013-04-29 DIAGNOSIS — E1129 Type 2 diabetes mellitus with other diabetic kidney complication: Secondary | ICD-10-CM

## 2013-04-29 MED ORDER — CLOTRIMAZOLE-BETAMETHASONE 1-0.05 % EX CREA: TOPICAL_CREAM | Freq: Three times a day (TID) | CUTANEOUS | Status: DC

## 2013-04-29 NOTE — Patient Instructions (Addendum)
Please reduce your insulin to 125 units each morning.  check your blood sugar 2 times a day.  vary the time of day when you check, between before the 3 meals, and at bedtime.  also check if you have symptoms of your blood sugar being too high or too low.  please keep a record of the readings and bring it to your next appointment here.  please call us sooner if you are having low blood sugar episodes.  Please make a follow-up appointment in 3 months.  i have sent a prescription to your pharmacy, for a skin cream for the rash.

## 2013-04-29 NOTE — Progress Notes (Signed)
Subjective:    Patient ID: Donna Howe, female    DOB: October 22, 1955, 57 y.o.   MRN: [id number]  HPI Pt returns for f/u of insulin-requiring DM (dx'ed 2002; she has mild if any neuropathy of the lower extremities, but she has associated renal failure, PAD, and CAD; compliance has been much better with a simpler qd insulin schedule; last episode of severe hypoglycemia was approx 2011).  On levemir 150 units qam, she has hypoglycemia almost every day, later in the day.   Pt reports 1 month of moderate rash on the left foot, but no assoc itching Past Medical History  Diagnosis Date  . Hypercholesteremia   . Hypertension   . HYPERLIPIDEMIA 03/31/2007  . Overweight(278.02) 08/26/2009  . HYPERTENSION 03/31/2007  . CAD (coronary artery disease) 03/31/2007  . ASTHMA 08/26/2009  . Gout   . SBO (small bowel obstruction) 2011    lap lysis of adhesions & hernia repair  . Hx of CABG     2005  . Ejection fraction     Normal EF echo 2010  //   EF 60%, catheterization, April, 2014  . Anginal pain   . Shortness of breath   . Diabetes mellitus   . DIABETES MELLITUS, TYPE II 03/31/2007  . Heart murmur   . PAD (peripheral artery disease)     Dr. Kellie Simmering  . Carotid artery occlusion   . RENAL FAILURE 03/31/2007    NOT ON HD ( PCP ONLY DR Rory Percy)  . Chronic kidney disease (CKD), stage III (moderate)   . Arthritis     GOUT  . Carotid artery disease   . Umbilical hernia     Past Surgical History  Procedure Laterality Date  . Cholecystectomy  2010  . Hernia repair  1989    open w mesh  . Cesarean section  1984  . Cardiac catheterization  07/18/2004  . Cardiac stents  2006-2007  . Incisional hernia repair x2  03/04/2010     laparoscopic with 35cm mesh by Dr Ronnald Collum  . Shoulder surgery    . Coronary artery bypass graft  2005  . Coronary angioplasty with stent placement  12/19/2012    [facility]    DES  . Endarterectomy Right 04/18/2013    Procedure: ENDARTERECTOMY CAROTID;  Surgeon: Mal Misty, MD;  Location: [place];  Service: Vascular;  Laterality: Right;  . Patch angioplasty Right 04/18/2013    Procedure: PATCH ANGIOPLASTY Right Internal Carotid Artery;  Surgeon: Mal Misty, MD;  Location: [place];  Service: Vascular;  Laterality: Right;    History   Social History  . Marital Status: Divorced    Spouse Name: N/A    Number of Children: N/A  . Years of Education: N/A   Occupational History  . Disabled    Social History Main Topics  . Smoking status: Former Smoker -- 1.00 packs/day for 20 years    Types: Cigarettes    Quit date: 12/10/2012  . Smokeless tobacco: Never Used  . Alcohol Use: No  . Drug Use: No  . Sexual Activity: Not on file   Other Topics Concern  . Not on file   Social History Narrative   Takes care of a boyfriend whom ia also disabled    Current Outpatient Prescriptions on File Prior to Visit  Medication Sig Dispense Refill  . aspirin EC 81 MG tablet Take 81 mg by mouth daily.      Marland Kitchen atorvastatin (LIPITOR) 80 MG tablet  Take 80 mg by mouth daily.      . clopidogrel (PLAVIX) 75 MG tablet Take 1 tablet (75 mg total) by mouth daily.  30 tablet  11  . colchicine 0.6 MG tablet Take 0.6 mg by mouth daily as needed (for gout flares).       . furosemide (LASIX) 40 MG tablet Take 40 mg by mouth daily.       Marland Kitchen glucose blood (ONE TOUCH ULTRA TEST) test strip 1 each by Other route 2 (two) times daily. And lancets 2/day 250.43  100 each  12  . insulin NPH (HUMULIN N,NOVOLIN N) 100 UNIT/ML injection Inject 125 Units into the skin daily before breakfast.       . Insulin Pen Needle 31G X 8 MM MISC Use as directed three times a day       . isosorbide dinitrate (ISORDIL) 20 MG tablet Take 20 mg by mouth at bedtime.       . metoprolol tartrate (LOPRESSOR) 12.5 mg TABS tablet Take 0.5 tablets (12.5 mg total) by mouth 2 (two) times daily.  60 tablet  1  . nitroGLYCERIN (NITROSTAT) 0.4 MG SL tablet Place 0.4 mg under the tongue every 5 (five) minutes as needed  for chest pain. Not to exceed 3 in 15 minute time frame      . oxyCODONE-acetaminophen (PERCOCET) 10-325 MG per tablet Take 1 tablet by mouth every 6 (six) hours as needed for pain.  30 tablet  0  . quinapril (ACCUPRIL) 5 MG tablet Take 5 mg by mouth daily.       No current facility-administered medications on file prior to visit.    Allergies  Allergen Reactions  . Penicillins     REACTION: Unknown, told as a child    Family History  Problem Relation Age of Onset  . Diabetes Mother   . Heart disease Mother   . Hyperlipidemia Mother   . Hypertension Mother   . Thyroid disease Father   . Hypertension Father   . AAA (abdominal aortic aneurysm) Father   . Heart disease Brother   . Hyperlipidemia Son   . Hypertension Son    BP 132/82  Pulse 80  Ht 5\' 6"  (1.676 m)  Wt 233 lb (105.688 kg)  BMI 37.63 kg/m2  SpO2 98%  Review of Systems Denies LOC.  She has gained weight     Objective:   Physical Exam VITAL SIGNS:  See vs page GENERAL: no distress.  Lab Results  Component Value Date   HGBA1C 6.1* 04/19/2013      Assessment & Plan:  Rash, new, uncertain etiology DM: overcontrolled.  This insulin regimen was chosen from multiple options, for its simplicity.  The benefits of glycemic control must be weighed against the risks of hypoglycemia.

## 2013-05-05 ENCOUNTER — Encounter: Payer: Self-pay | Admitting: Vascular Surgery

## 2013-05-06 ENCOUNTER — Ambulatory Visit (INDEPENDENT_AMBULATORY_CARE_PROVIDER_SITE_OTHER): Payer: Medicare Other | Admitting: Vascular Surgery

## 2013-05-06 ENCOUNTER — Encounter: Payer: Self-pay | Admitting: Vascular Surgery

## 2013-05-06 DIAGNOSIS — I6529 Occlusion and stenosis of unspecified carotid artery: Secondary | ICD-10-CM

## 2013-05-06 NOTE — Progress Notes (Signed)
Subjective:     Patient ID: Donna Howe, female   DOB: Jul 30, 1956, 57 y.o.   MRN: [id number]  HPI this 57 year old female returns for initial followup regarding a right carotid and arterectomy performed 04/18/2013. She was asymptomatic preoperatively. She had a very tight right ICA probably 99% stenotic. She did well during her surgery the following morning she had some chest discomfort and was found to have a non-STEMI. She had cardiac catheterization by Dr. Donnella Bi which revealed some mild branch occlusions adjacent to the stents and she is being treated medically with aspirin and Plavix. She has had no new neurologic symptoms since her discharge from the hospital. She is swallowing well has no hoarseness he continues to take aspirin and Plavix.  Past Medical History  Diagnosis Date  . Hypercholesteremia   . Hypertension   . HYPERLIPIDEMIA 03/31/2007  . Overweight(278.02) 08/26/2009  . HYPERTENSION 03/31/2007  . CAD (coronary artery disease) 03/31/2007  . ASTHMA 08/26/2009  . Gout   . SBO (small bowel obstruction) 2011    lap lysis of adhesions & hernia repair  . Hx of CABG     2005  . Ejection fraction     Normal EF echo 2010  //   EF 60%, catheterization, April, 2014  . Anginal pain   . Shortness of breath   . Diabetes mellitus   . DIABETES MELLITUS, TYPE II 03/31/2007  . Heart murmur   . PAD (peripheral artery disease)     Dr. Kellie Simmering  . Carotid artery occlusion   . RENAL FAILURE 03/31/2007    NOT ON HD ( PCP ONLY DR Rory Percy)  . Chronic kidney disease (CKD), stage III (moderate)   . Arthritis     GOUT  . Carotid artery disease   . Umbilical hernia     History  Substance Use Topics  . Smoking status: Former Smoker -- 1.00 packs/day for 20 years    Types: Cigarettes    Quit date: 12/10/2012  . Smokeless tobacco: Never Used  . Alcohol Use: No    Family History  Problem Relation Age of Onset  . Diabetes Mother   . Heart disease Mother   . Hyperlipidemia  Mother   . Hypertension Mother   . Thyroid disease Father   . Hypertension Father   . AAA (abdominal aortic aneurysm) Father   . Heart disease Brother   . Hyperlipidemia Son   . Hypertension Son     Allergies  Allergen Reactions  . Penicillins     REACTION: Unknown, told as a child    Current outpatient prescriptions:aspirin EC 81 MG tablet, Take 81 mg by mouth daily., Disp: , Rfl: ;  atorvastatin (LIPITOR) 80 MG tablet, Take 80 mg by mouth daily., Disp: , Rfl: ;  colchicine 0.6 MG tablet, Take 0.6 mg by mouth daily as needed (for gout flares). , Disp: , Rfl: ;  furosemide (LASIX) 40 MG tablet, Take 40 mg by mouth daily. , Disp: , Rfl:  insulin NPH (HUMULIN N,NOVOLIN N) 100 UNIT/ML injection, Inject 125 Units into the skin daily before breakfast. , Disp: , Rfl: ;  Insulin Pen Needle 31G X 8 MM MISC, Use as directed three times a day , Disp: , Rfl: ;  isosorbide dinitrate (ISORDIL) 20 MG tablet, Take 20 mg by mouth at bedtime. , Disp: , Rfl: ;  metoprolol tartrate (LOPRESSOR) 12.5 mg TABS tablet, Take 0.5 tablets (12.5 mg total) by mouth 2 (two) times daily., Disp: 60 tablet, Rfl:  1 nitroGLYCERIN (NITROSTAT) 0.4 MG SL tablet, Place 0.4 mg under the tongue every 5 (five) minutes as needed for chest pain. Not to exceed 3 in 15 minute time frame, Disp: , Rfl: ;  oxyCODONE-acetaminophen (PERCOCET) 10-325 MG per tablet, Take 1 tablet by mouth every 6 (six) hours as needed for pain., Disp: 30 tablet, Rfl: 0;  quinapril (ACCUPRIL) 5 MG tablet, Take 5 mg by mouth daily., Disp: , Rfl:  clopidogrel (PLAVIX) 75 MG tablet, Take 1 tablet (75 mg total) by mouth daily., Disp: 30 tablet, Rfl: 11;  clotrimazole-betamethasone (LOTRISONE) cream, Apply topically 3 (three) times daily. As needed for rash, Disp: 45 g, Rfl: 2;  glucose blood (ONE TOUCH ULTRA TEST) test strip, 1 each by Other route 2 (two) times daily. And lancets 2/day 250.43, Disp: 100 each, Rfl: 12  BP 140/72  Pulse 62  Temp(Src) 97.9 F (36.6 C)  (Oral)  Ht 5\' 6"  (1.676 m)  Wt 235 lb (106.595 kg)  BMI 37.95 kg/m2  SpO2 100%  Body mass index is 37.95 kg/(m^2).           Review of Systems     Objective:   Physical Exam BP 140/72  Pulse 62  Temp(Src) 97.9 F (36.6 C) (Oral)  Ht 5\' 6"  (1.676 m)  Wt 235 lb (106.595 kg)  BMI 37.95 kg/m2  SpO2 100%  General well-developed well-nourished female in no apparent stress alert and oriented x3 Lungs no rhonchi or wheezing Right neck incision healing nicely with no hematoma or infection Neurologic exam reveals a mild right marginal mandibular nerve paresis otherwise normal neurologic exam      Assessment:     Doing well from a neurologic standpoint 2 and half weeks post right carotid endarterectomy for. Severe right ICA stenosis which was asymptomatic-complicated by non-STEMI 1 day postop treated medically    Plan:     Return in 6 months with followup carotid duplex exam of this patient developed symptoms in the interim Continue aspirin and Plavix

## 2013-05-07 ENCOUNTER — Other Ambulatory Visit: Payer: Self-pay | Admitting: *Deleted

## 2013-05-07 DIAGNOSIS — Z48812 Encounter for surgical aftercare following surgery on the circulatory system: Secondary | ICD-10-CM

## 2013-07-24 ENCOUNTER — Other Ambulatory Visit: Payer: Self-pay

## 2013-07-28 ENCOUNTER — Encounter: Payer: Self-pay | Admitting: Cardiology

## 2013-07-30 ENCOUNTER — Ambulatory Visit (INDEPENDENT_AMBULATORY_CARE_PROVIDER_SITE_OTHER): Payer: Medicare Other | Admitting: Endocrinology

## 2013-07-30 ENCOUNTER — Encounter: Payer: Self-pay | Admitting: Endocrinology

## 2013-07-30 DIAGNOSIS — E1129 Type 2 diabetes mellitus with other diabetic kidney complication: Secondary | ICD-10-CM

## 2013-07-30 LAB — HEMOGLOBIN A1C: Hgb A1c MFr Bld: 6.9 % — ABNORMAL HIGH (ref 4.6–6.5)

## 2013-07-30 NOTE — Progress Notes (Signed)
Subjective:    Patient ID: Donna Howe, female    DOB: 07/11/1956, 57 y.o.   MRN: [id number]  HPI Pt returns for f/u of insulin-requiring DM (dx'ed 2002, on a routine blood test; she has mild if any neuropathy of the lower extremities, but she has associated renal failure, PAD, and CAD; compliance has been much better with a simpler qd insulin schedule; last episode of severe hypoglycemia was approx 2011; she has never had DKA).  On the reduced dosage of levemir, she has mild hypoglycemia less often--now approx once a week.  This happens when she misses a meal.  pt states she feels well in general, except for fatigue and low-back pain.   Past Medical History  Diagnosis Date  . Hypercholesteremia   . Hypertension   . HYPERLIPIDEMIA 03/31/2007  . Overweight(278.02) 08/26/2009  . HYPERTENSION 03/31/2007  . CAD (coronary artery disease) 03/31/2007  . ASTHMA 08/26/2009  . Gout   . SBO (small bowel obstruction) 2011    lap lysis of adhesions & hernia repair  . Hx of CABG     2005  . Ejection fraction     Normal EF echo 2010  //   EF 60%, catheterization, April, 2014  . Anginal pain   . Shortness of breath   . Diabetes mellitus   . DIABETES MELLITUS, TYPE II 03/31/2007  . Heart murmur   . PAD (peripheral artery disease)     Dr. Kellie Simmering  . Carotid artery occlusion   . RENAL FAILURE 03/31/2007    NOT ON HD ( PCP ONLY DR Rory Percy)  . Chronic kidney disease (CKD), stage III (moderate)   . Arthritis     GOUT  . Carotid artery disease   . Umbilical hernia     Past Surgical History  Procedure Laterality Date  . Cholecystectomy  2010  . Hernia repair  1989    open w mesh  . Cesarean section  1984  . Cardiac catheterization  07/18/2004  . Cardiac stents  2006-2007  . Incisional hernia repair x2  03/04/2010     laparoscopic with 35cm mesh by Dr Ronnald Collum  . Shoulder surgery    . Coronary artery bypass graft  2005  . Coronary angioplasty with stent placement  12/19/2012    [facility]    DES  . Endarterectomy Right 04/18/2013    Procedure: ENDARTERECTOMY CAROTID;  Surgeon: Mal Misty, MD;  Location: [facility];  Service: Vascular;  Laterality: Right;  . Patch angioplasty Right 04/18/2013    Procedure: PATCH ANGIOPLASTY Right Internal Carotid Artery;  Surgeon: Mal Misty, MD;  Location: [facility];  Service: Vascular;  Laterality: Right;    History   Social History  . Marital Status: Divorced    Spouse Name: N/A    Number of Children: N/A  . Years of Education: N/A   Occupational History  . Disabled    Social History Main Topics  . Smoking status: Former Smoker -- 1.00 packs/day for 20 years    Types: Cigarettes    Quit date: 12/10/2012  . Smokeless tobacco: Never Used  . Alcohol Use: No  . Drug Use: No  . Sexual Activity: Not on file   Other Topics Concern  . Not on file   Social History Narrative   Takes care of a boyfriend whom ia also disabled    Current Outpatient Prescriptions on File Prior to Visit  Medication Sig Dispense Refill  . aspirin EC 81 MG  tablet Take 81 mg by mouth daily.      . clopidogrel (PLAVIX) 75 MG tablet Take 1 tablet (75 mg total) by mouth daily.  30 tablet  11  . clotrimazole-betamethasone (LOTRISONE) cream Apply topically 3 (three) times daily. As needed for rash  45 g  2  . colchicine 0.6 MG tablet Take 0.6 mg by mouth daily as needed (for gout flares).       . furosemide (LASIX) 40 MG tablet Take 40 mg by mouth daily.       Marland Kitchen glucose blood (ONE TOUCH ULTRA TEST) test strip 1 each by Other route 2 (two) times daily. And lancets 2/day 250.43  100 each  12  . Insulin Pen Needle 31G X 8 MM MISC Use as directed three times a day       . isosorbide dinitrate (ISORDIL) 20 MG tablet Take 20 mg by mouth at bedtime.       . metoprolol tartrate (LOPRESSOR) 12.5 mg TABS tablet Take 0.5 tablets (12.5 mg total) by mouth 2 (two) times daily.  60 tablet  1  . nitroGLYCERIN (NITROSTAT) 0.4 MG SL tablet Place 0.4 mg under the tongue every 5  (five) minutes as needed for chest pain. Not to exceed 3 in 15 minute time frame      . oxyCODONE-acetaminophen (PERCOCET) 10-325 MG per tablet Take 1 tablet by mouth every 6 (six) hours as needed for pain.  30 tablet  0  . quinapril (ACCUPRIL) 5 MG tablet Take 5 mg by mouth daily.       No current facility-administered medications on file prior to visit.    Allergies  Allergen Reactions  . Penicillins     REACTION: Unknown, told as a child    Family History  Problem Relation Age of Onset  . Diabetes Mother   . Heart disease Mother   . Hyperlipidemia Mother   . Hypertension Mother   . Thyroid disease Father   . Hypertension Father   . AAA (abdominal aortic aneurysm) Father   . Heart disease Brother   . Hyperlipidemia Son   . Hypertension Son    BP 118/88  Pulse 60  Temp(Src) 97.6 F (36.4 C) (Oral)  Ht 5\' 5"  (1.651 m)  Wt 257 lb (116.574 kg)  BMI 42.77 kg/m2  Review of Systems She has gained weight.  She denies LOC.      Objective:   Physical Exam VITAL SIGNS:  See vs page GENERAL: no distress  Lab Results  Component Value Date   HGBA1C 6.9* 07/30/2013      Assessment & Plan:  DM: overcontrolled given this regimen, which does match insulin to her changing needs throughout the day.  This insulin regimen was chosen from multiple options, for its simplicity.  The benefits of glycemic control must be weighed against the risks of hypoglycemia.   Renal failure: this increases the risk of hypoglycemia.

## 2013-07-30 NOTE — Patient Instructions (Addendum)
blood tests are being requested for you today.  We'll contact you with results. On this type of insulin schedule, you should eat meals on a regular schedule.  If a meal is missed or significantly delayed, your blood sugar could go low.  check your blood sugar 2 times a day.  vary the time of day when you check, between before the 3 meals, and at bedtime.  also check if you have symptoms of your blood sugar being too high or too low.  please keep a record of the readings and bring it to your next appointment here.  please call us sooner if you are having low blood sugar episodes.  Please make a follow-up appointment in 3 months.

## 2013-07-31 ENCOUNTER — Telehealth: Payer: Self-pay | Admitting: *Deleted

## 2013-07-31 ENCOUNTER — Telehealth: Payer: Self-pay | Admitting: Endocrinology

## 2013-07-31 NOTE — Telephone Encounter (Signed)
Phoned patient, but by the time I spoke to her, she had already gone online & read the answer to her question on her mychart.

## 2013-10-20 ENCOUNTER — Other Ambulatory Visit: Payer: Self-pay

## 2013-10-20 MED ORDER — GLUCOSE BLOOD VI STRP: 1.0000 | ORAL_STRIP | Freq: Two times a day (BID) | Status: DC

## 2013-10-26 ENCOUNTER — Emergency Department (HOSPITAL_COMMUNITY): Payer: Medicare Other

## 2013-10-26 ENCOUNTER — Encounter (HOSPITAL_COMMUNITY): Payer: Self-pay | Admitting: Emergency Medicine

## 2013-10-26 ENCOUNTER — Inpatient Hospital Stay (HOSPITAL_COMMUNITY)
Admission: EM | Admit: 2013-10-26 | Discharge: 2013-10-29 | DRG: 312 | Disposition: A | Discharge status: Home / Self Care | Payer: Medicare Other | Attending: Internal Medicine | Admitting: Internal Medicine

## 2013-10-26 DIAGNOSIS — N186 End stage renal disease: Secondary | ICD-10-CM | POA: Diagnosis present

## 2013-10-26 DIAGNOSIS — M109 Gout, unspecified: Secondary | ICD-10-CM | POA: Diagnosis present

## 2013-10-26 DIAGNOSIS — W19XXXA Unspecified fall, initial encounter: Secondary | ICD-10-CM | POA: Diagnosis present

## 2013-10-26 DIAGNOSIS — E119 Type 2 diabetes mellitus without complications: Secondary | ICD-10-CM

## 2013-10-26 DIAGNOSIS — Y92009 Unspecified place in unspecified non-institutional (private) residence as the place of occurrence of the external cause: Secondary | ICD-10-CM

## 2013-10-26 DIAGNOSIS — D649 Anemia, unspecified: Secondary | ICD-10-CM | POA: Diagnosis present

## 2013-10-26 DIAGNOSIS — Z951 Presence of aortocoronary bypass graft: Secondary | ICD-10-CM

## 2013-10-26 DIAGNOSIS — E1122 Type 2 diabetes mellitus with diabetic chronic kidney disease: Secondary | ICD-10-CM | POA: Diagnosis present

## 2013-10-26 DIAGNOSIS — E669 Obesity, unspecified: Secondary | ICD-10-CM | POA: Diagnosis present

## 2013-10-26 DIAGNOSIS — J45909 Unspecified asthma, uncomplicated: Secondary | ICD-10-CM | POA: Diagnosis present

## 2013-10-26 DIAGNOSIS — I9589 Other hypotension: Secondary | ICD-10-CM | POA: Diagnosis present

## 2013-10-26 DIAGNOSIS — S92911A Unspecified fracture of right toe(s), initial encounter for closed fracture: Secondary | ICD-10-CM | POA: Diagnosis present

## 2013-10-26 DIAGNOSIS — I779 Disorder of arteries and arterioles, unspecified: Secondary | ICD-10-CM | POA: Diagnosis present

## 2013-10-26 DIAGNOSIS — I739 Peripheral vascular disease, unspecified: Secondary | ICD-10-CM | POA: Diagnosis present

## 2013-10-26 DIAGNOSIS — Z833 Family history of diabetes mellitus: Secondary | ICD-10-CM

## 2013-10-26 DIAGNOSIS — E1169 Type 2 diabetes mellitus with other specified complication: Secondary | ICD-10-CM

## 2013-10-26 DIAGNOSIS — D696 Thrombocytopenia, unspecified: Secondary | ICD-10-CM | POA: Diagnosis present

## 2013-10-26 DIAGNOSIS — I6529 Occlusion and stenosis of unspecified carotid artery: Secondary | ICD-10-CM | POA: Diagnosis present

## 2013-10-26 DIAGNOSIS — E785 Hyperlipidemia, unspecified: Secondary | ICD-10-CM | POA: Diagnosis present

## 2013-10-26 DIAGNOSIS — M129 Arthropathy, unspecified: Secondary | ICD-10-CM | POA: Diagnosis present

## 2013-10-26 DIAGNOSIS — Z7982 Long term (current) use of aspirin: Secondary | ICD-10-CM

## 2013-10-26 DIAGNOSIS — Z9861 Coronary angioplasty status: Secondary | ICD-10-CM

## 2013-10-26 DIAGNOSIS — Z7902 Long term (current) use of antithrombotics/antiplatelets: Secondary | ICD-10-CM

## 2013-10-26 DIAGNOSIS — Z8249 Family history of ischemic heart disease and other diseases of the circulatory system: Secondary | ICD-10-CM

## 2013-10-26 DIAGNOSIS — N2581 Secondary hyperparathyroidism of renal origin: Secondary | ICD-10-CM | POA: Diagnosis present

## 2013-10-26 DIAGNOSIS — I129 Hypertensive chronic kidney disease with stage 1 through stage 4 chronic kidney disease, or unspecified chronic kidney disease: Secondary | ICD-10-CM | POA: Diagnosis present

## 2013-10-26 DIAGNOSIS — E7800 Pure hypercholesterolemia, unspecified: Secondary | ICD-10-CM | POA: Diagnosis present

## 2013-10-26 DIAGNOSIS — E782 Mixed hyperlipidemia: Secondary | ICD-10-CM | POA: Diagnosis present

## 2013-10-26 DIAGNOSIS — E876 Hypokalemia: Secondary | ICD-10-CM | POA: Diagnosis present

## 2013-10-26 DIAGNOSIS — E860 Dehydration: Secondary | ICD-10-CM

## 2013-10-26 DIAGNOSIS — S92501A Displaced unspecified fracture of right lesser toe(s), initial encounter for closed fracture: Secondary | ICD-10-CM

## 2013-10-26 DIAGNOSIS — E11649 Type 2 diabetes mellitus with hypoglycemia without coma: Secondary | ICD-10-CM | POA: Diagnosis present

## 2013-10-26 DIAGNOSIS — I951 Orthostatic hypotension: Principal | ICD-10-CM | POA: Diagnosis present

## 2013-10-26 DIAGNOSIS — E1129 Type 2 diabetes mellitus with other diabetic kidney complication: Secondary | ICD-10-CM

## 2013-10-26 DIAGNOSIS — N189 Chronic kidney disease, unspecified: Secondary | ICD-10-CM

## 2013-10-26 DIAGNOSIS — IMO0002 Reserved for concepts with insufficient information to code with codable children: Secondary | ICD-10-CM | POA: Diagnosis present

## 2013-10-26 DIAGNOSIS — J45901 Unspecified asthma with (acute) exacerbation: Secondary | ICD-10-CM | POA: Diagnosis present

## 2013-10-26 DIAGNOSIS — I2510 Atherosclerotic heart disease of native coronary artery without angina pectoris: Secondary | ICD-10-CM | POA: Diagnosis present

## 2013-10-26 DIAGNOSIS — N058 Unspecified nephritic syndrome with other morphologic changes: Secondary | ICD-10-CM | POA: Diagnosis present

## 2013-10-26 DIAGNOSIS — E039 Hypothyroidism, unspecified: Secondary | ICD-10-CM | POA: Diagnosis present

## 2013-10-26 DIAGNOSIS — Z794 Long term (current) use of insulin: Secondary | ICD-10-CM

## 2013-10-26 DIAGNOSIS — I10 Essential (primary) hypertension: Secondary | ICD-10-CM | POA: Diagnosis present

## 2013-10-26 DIAGNOSIS — S92919A Unspecified fracture of unspecified toe(s), initial encounter for closed fracture: Secondary | ICD-10-CM | POA: Diagnosis present

## 2013-10-26 DIAGNOSIS — R55 Syncope and collapse: Secondary | ICD-10-CM

## 2013-10-26 DIAGNOSIS — E1165 Type 2 diabetes mellitus with hyperglycemia: Secondary | ICD-10-CM | POA: Diagnosis present

## 2013-10-26 DIAGNOSIS — T502X5A Adverse effect of carbonic-anhydrase inhibitors, benzothiadiazides and other diuretics, initial encounter: Secondary | ICD-10-CM | POA: Diagnosis present

## 2013-10-26 DIAGNOSIS — N179 Acute kidney failure, unspecified: Secondary | ICD-10-CM | POA: Diagnosis present

## 2013-10-26 DIAGNOSIS — N183 Chronic kidney disease, stage 3 unspecified: Secondary | ICD-10-CM | POA: Diagnosis present

## 2013-10-26 DIAGNOSIS — Z87891 Personal history of nicotine dependence: Secondary | ICD-10-CM

## 2013-10-26 HISTORY — DX: Thrombocytopenia, unspecified: D69.6

## 2013-10-26 LAB — URINALYSIS, ROUTINE W REFLEX MICROSCOPIC
BILIRUBIN URINE: NEGATIVE
Glucose, UA: NEGATIVE mg/dL
HGB URINE DIPSTICK: NEGATIVE
Ketones, ur: NEGATIVE mg/dL
Leukocytes, UA: NEGATIVE
NITRITE: NEGATIVE
PROTEIN: NEGATIVE mg/dL
Specific Gravity, Urine: 1.01 (ref 1.005–1.030)
UROBILINOGEN UA: 0.2 mg/dL (ref 0.0–1.0)
pH: 6 (ref 5.0–8.0)

## 2013-10-26 LAB — CBC WITH DIFFERENTIAL/PLATELET
Basophils Absolute: 0 10*3/uL (ref 0.0–0.1)
Basophils Relative: 0 % (ref 0–1)
Eosinophils Absolute: 0.1 10*3/uL (ref 0.0–0.7)
Eosinophils Relative: 2 % (ref 0–5)
HCT: 33.7 % — ABNORMAL LOW (ref 36.0–46.0)
Hemoglobin: 11.7 g/dL — ABNORMAL LOW (ref 12.0–15.0)
Lymphocytes Relative: 35 % (ref 12–46)
Lymphs Abs: 1.8 10*3/uL (ref 0.7–4.0)
MCH: 30.9 pg (ref 26.0–34.0)
MCHC: 34.7 g/dL (ref 30.0–36.0)
MCV: 88.9 fL (ref 78.0–100.0)
Monocytes Absolute: 0.4 10*3/uL (ref 0.1–1.0)
Monocytes Relative: 9 % (ref 3–12)
Neutro Abs: 2.8 10*3/uL (ref 1.7–7.7)
Neutrophils Relative %: 54 % (ref 43–77)
Platelets: 152 10*3/uL (ref 150–400)
RBC: 3.79 MIL/uL — ABNORMAL LOW (ref 3.87–5.11)
RDW: 13.5 % (ref 11.5–15.5)
WBC: 5.1 10*3/uL (ref 4.0–10.5)

## 2013-10-26 LAB — BASIC METABOLIC PANEL
BUN: 72 mg/dL — ABNORMAL HIGH (ref 6–23)
CO2: 26 mEq/L (ref 19–32)
Calcium: 10 mg/dL (ref 8.4–10.5)
Chloride: 89 mEq/L — ABNORMAL LOW (ref 96–112)
Creatinine, Ser: 2.7 mg/dL — ABNORMAL HIGH (ref 0.50–1.10)
GFR calc Af Amer: 22 mL/min — ABNORMAL LOW (ref >90)
GFR calc non Af Amer: 19 mL/min — ABNORMAL LOW (ref >90)
Glucose, Bld: 202 mg/dL — ABNORMAL HIGH (ref 70–99)
Potassium: 3.7 mEq/L (ref 3.7–5.3)
Sodium: 134 mEq/L — ABNORMAL LOW (ref 137–147)

## 2013-10-26 LAB — TROPONIN I: Troponin I: <0.3 ng/mL (ref <0.30)

## 2013-10-26 LAB — GLUCOSE, CAPILLARY
Glucose-Capillary: 195 mg/dL — ABNORMAL HIGH (ref 70–99)
Glucose-Capillary: 159 mg/dL — ABNORMAL HIGH (ref 70–99)

## 2013-10-26 MED ORDER — ISOSORBIDE DINITRATE 20 MG PO TABS
20.0000 mg | ORAL_TABLET | Freq: Every day | ORAL | Status: DC
  Administered 2013-10-26 – 2013-10-28 (×3): 20 mg via ORAL
  Filled 2013-10-26 (×3): qty 1

## 2013-10-26 MED ORDER — SODIUM CHLORIDE 0.9 % IV SOLN: INTRAVENOUS | Status: DC

## 2013-10-26 MED ORDER — ASPIRIN EC 81 MG PO TBEC
81.0000 mg | DELAYED_RELEASE_TABLET | Freq: Every day | ORAL | Status: DC
  Administered 2013-10-27 – 2013-10-29 (×3): 81 mg via ORAL
  Filled 2013-10-26 (×3): qty 1

## 2013-10-26 MED ORDER — OXYCODONE-ACETAMINOPHEN 10-325 MG PO TABS: 1.0000 | ORAL_TABLET | Freq: Four times a day (QID) | ORAL | Status: DC | PRN

## 2013-10-26 MED ORDER — IBUPROFEN 400 MG PO TABS
400.0000 mg | ORAL_TABLET | Freq: Once | ORAL | Status: AC
  Administered 2013-10-26: 400 mg via ORAL
  Filled 2013-10-26: qty 1

## 2013-10-26 MED ORDER — ONDANSETRON HCL 4 MG/2ML IJ SOLN
4.0000 mg | Freq: Once | INTRAMUSCULAR | Status: AC
  Administered 2013-10-26: 4 mg via INTRAVENOUS
  Filled 2013-10-26: qty 2

## 2013-10-26 MED ORDER — INSULIN NPH (HUMAN) (ISOPHANE) 100 UNIT/ML ~~LOC~~ SUSP
100.0000 [IU] | Freq: Every day | SUBCUTANEOUS | Status: DC
  Filled 2013-10-26: qty 10

## 2013-10-26 MED ORDER — GABAPENTIN 400 MG PO CAPS
400.0000 mg | ORAL_CAPSULE | Freq: Every day | ORAL | Status: DC
  Administered 2013-10-26 – 2013-10-28 (×3): 400 mg via ORAL
  Filled 2013-10-26 (×2): qty 1

## 2013-10-26 MED ORDER — INSULIN ASPART 100 UNIT/ML ~~LOC~~ SOLN
0.0000 [IU] | Freq: Three times a day (TID) | SUBCUTANEOUS | Status: DC
  Administered 2013-10-27: 1 [IU] via SUBCUTANEOUS
  Administered 2013-10-28 (×2): 2 [IU] via SUBCUTANEOUS
  Administered 2013-10-29: 1 [IU] via SUBCUTANEOUS
  Administered 2013-10-29: 2 [IU] via SUBCUTANEOUS

## 2013-10-26 MED ORDER — OXYCODONE HCL 5 MG PO TABS
5.0000 mg | ORAL_TABLET | Freq: Four times a day (QID) | ORAL | Status: DC | PRN
  Administered 2013-10-26 – 2013-10-28 (×5): 5 mg via ORAL
  Filled 2013-10-26 (×5): qty 1

## 2013-10-26 MED ORDER — CLOPIDOGREL BISULFATE 75 MG PO TABS
75.0000 mg | ORAL_TABLET | Freq: Every day | ORAL | Status: DC
  Administered 2013-10-27 – 2013-10-29 (×3): 75 mg via ORAL
  Filled 2013-10-26 (×3): qty 1

## 2013-10-26 MED ORDER — LEVOTHYROXINE SODIUM 88 MCG PO TABS
88.0000 ug | ORAL_TABLET | Freq: Every day | ORAL | Status: DC
  Administered 2013-10-27 – 2013-10-29 (×3): 88 ug via ORAL
  Filled 2013-10-26 (×5): qty 1

## 2013-10-26 MED ORDER — SODIUM CHLORIDE 0.9 % IV BOLUS (SEPSIS)
1000.0000 mL | Freq: Once | INTRAVENOUS | Status: AC
  Administered 2013-10-26: 1000 mL via INTRAVENOUS

## 2013-10-26 MED ORDER — SODIUM CHLORIDE 0.9 % IV SOLN
INTRAVENOUS | Status: AC
  Administered 2013-10-26 – 2013-10-27 (×3): via INTRAVENOUS

## 2013-10-26 MED ORDER — ONDANSETRON HCL 4 MG/2ML IJ SOLN
4.0000 mg | Freq: Four times a day (QID) | INTRAMUSCULAR | Status: DC | PRN
  Administered 2013-10-28: 4 mg via INTRAVENOUS
  Filled 2013-10-26: qty 2

## 2013-10-26 MED ORDER — ACETAMINOPHEN 325 MG PO TABS: 650.0000 mg | ORAL_TABLET | Freq: Four times a day (QID) | ORAL | Status: DC | PRN

## 2013-10-26 MED ORDER — ALBUTEROL SULFATE (2.5 MG/3ML) 0.083% IN NEBU: 2.5000 mg | INHALATION_SOLUTION | RESPIRATORY_TRACT | Status: DC | PRN

## 2013-10-26 MED ORDER — ACETAMINOPHEN 650 MG RE SUPP: 650.0000 mg | Freq: Four times a day (QID) | RECTAL | Status: DC | PRN

## 2013-10-26 MED ORDER — ONDANSETRON HCL 4 MG PO TABS: 4.0000 mg | ORAL_TABLET | Freq: Four times a day (QID) | ORAL | Status: DC | PRN

## 2013-10-26 MED ORDER — SODIUM CHLORIDE 0.9 % IJ SOLN
3.0000 mL | Freq: Two times a day (BID) | INTRAMUSCULAR | Status: DC
  Administered 2013-10-26 – 2013-10-28 (×5): 3 mL via INTRAVENOUS

## 2013-10-26 MED ORDER — INSULIN ASPART 100 UNIT/ML ~~LOC~~ SOLN: 0.0000 [IU] | Freq: Every day | SUBCUTANEOUS | Status: DC

## 2013-10-26 MED ORDER — ENOXAPARIN SODIUM 30 MG/0.3ML ~~LOC~~ SOLN
30.0000 mg | SUBCUTANEOUS | Status: DC
  Administered 2013-10-26 – 2013-10-27 (×2): 30 mg via SUBCUTANEOUS
  Filled 2013-10-26 (×2): qty 0.3

## 2013-10-26 MED ORDER — MORPHINE SULFATE 4 MG/ML IJ SOLN
4.0000 mg | INTRAMUSCULAR | Status: AC | PRN
  Administered 2013-10-26 (×2): 4 mg via INTRAVENOUS
  Filled 2013-10-26 (×2): qty 1

## 2013-10-26 MED ORDER — OXYCODONE-ACETAMINOPHEN 5-325 MG PO TABS
1.0000 | ORAL_TABLET | Freq: Four times a day (QID) | ORAL | Status: DC | PRN
  Administered 2013-10-26 – 2013-10-28 (×5): 1 via ORAL
  Filled 2013-10-26 (×5): qty 1

## 2013-10-26 MED ORDER — COLCHICINE 0.6 MG PO TABS
0.6000 mg | ORAL_TABLET | Freq: Every day | ORAL | Status: DC
  Administered 2013-10-27: 0.6 mg via ORAL
  Filled 2013-10-26: qty 1

## 2013-10-26 MED ORDER — ONDANSETRON HCL 4 MG/2ML IJ SOLN: 4.0000 mg | Freq: Once | INTRAMUSCULAR | Status: DC

## 2013-10-26 MED ORDER — NITROGLYCERIN 0.4 MG SL SUBL: 0.4000 mg | SUBLINGUAL_TABLET | SUBLINGUAL | Status: DC | PRN

## 2013-10-26 NOTE — ED Provider Notes (Signed)
CSN: [id number]     Arrival date & time 10/26/13  1344 History  This chart was scribed for Virgel Manifold, MD by Roxan Diesel, ED scribe.  This patient was seen in room APA04/APA04 and the patient's care was started at 2:03 PM.   Chief Complaint  Patient presents with  . Loss of Consciousness    The history is provided by the patient. No language interpreter was used.    HPI Comments: Donna Howe is a 58 y.o. female with h/o CAD, DM, HTN and hyperlipidemia who presents to the Emergency Department complaining of a syncopal episode that occurred pta.  Pt states she got off of the couch and walked over to the thermostat.  She then developed a dizzy sensation described as "if I don't sit back down I will fall down."  She denies associated CP, back pain, SOB, nausea, or diaphoresis.  The last thing she remembers is standing with her hand on the side of the door and reaching over for a thermostat on the wall at around eye level.  The next thing she experienced was her son shouting at her while she was lying on the floor after a fall.  She states she hit her head and right leg in the fall.  Currently she states she feels slightly dizzy described as "my head is kind of drifting."  She also complains of head and neck pain.  She also hit her right shin and right foot and is complaining of moderate-to-severe pain to those areas, especially her right toes.  Pain is worsened by bearing weight and she has not been able to walk since then.  She denies numbness or tingling in toes.  Pt also complains of numbness to bilateral 1st-3rd fingers which has been ongoing intermittently since her right carotic endarterectomy in August 2014.  Pt states she has been having similar dizzy episodes for some time but she denies prior syncopal episodes.  She also notes that these dizzy spells have become more frequent over the past week, increasing from around 1-2x/day previously to 3-4x/day in the past week.  She reports that  her PCP thought these episodes may be due to polypharmacy and took her off of several medications, with plans to eventually take her off of "everything I don't need" to see if this alleviates her dizzy spells.  PCP is Dr. Rory Percy   Past Medical History  Diagnosis Date  . Hypercholesteremia   . Hypertension   . HYPERLIPIDEMIA 03/31/2007  . Overweight 08/26/2009  . HYPERTENSION 03/31/2007  . CAD (coronary artery disease) 03/31/2007  . ASTHMA 08/26/2009  . Gout   . SBO (small bowel obstruction) 2011    lap lysis of adhesions & hernia repair  . Hx of CABG     2005  . Ejection fraction     Normal EF echo 2010  //   EF 60%, catheterization, April, 2014  . Anginal pain   . Shortness of breath   . Diabetes mellitus   . DIABETES MELLITUS, TYPE II 03/31/2007  . Heart murmur   . PAD (peripheral artery disease)     Dr. Kellie Simmering  . Carotid artery occlusion   . RENAL FAILURE 03/31/2007    NOT ON HD ( PCP ONLY DR Rory Percy)  . Chronic kidney disease (CKD), stage III (moderate)   . Arthritis     GOUT  . Carotid artery disease   . Umbilical hernia     Past Surgical History  Procedure Laterality Date  .  Cholecystectomy  2010  . Hernia repair  1989    open w mesh  . Cesarean section  1984  . Cardiac catheterization  07/18/2004  . Cardiac stents  2006-2007  . Incisional hernia repair x2  03/04/2010     laparoscopic with 35cm mesh by Dr Ronnald Collum  . Shoulder surgery    . Coronary artery bypass graft  2005  . Coronary angioplasty with stent placement  12/19/2012    [facility]    DES  . Endarterectomy Right 04/18/2013    Procedure: ENDARTERECTOMY CAROTID;  Surgeon: Mal Misty, MD;  Location: [facility];  Service: Vascular;  Laterality: Right;  . Patch angioplasty Right 04/18/2013    Procedure: PATCH ANGIOPLASTY Right Internal Carotid Artery;  Surgeon: Mal Misty, MD;  Location: [facility];  Service: Vascular;  Laterality: Right;    Family History  Problem Relation Age of Onset  .  Diabetes Mother   . Heart disease Mother   . Hyperlipidemia Mother   . Hypertension Mother   . Thyroid disease Father   . Hypertension Father   . AAA (abdominal aortic aneurysm) Father   . Heart disease Brother   . Hyperlipidemia Son   . Hypertension Son     History  Substance Use Topics  . Smoking status: Former Smoker -- 1.00 packs/day for 20 years    Types: Cigarettes    Quit date: 12/10/2012  . Smokeless tobacco: Never Used  . Alcohol Use: No    OB History   Grav Para Term Preterm Abortions TAB SAB Ect Mult Living                  Review of Systems  All other systems reviewed and are negative.     Allergies  Penicillins  Home Medications   Current Outpatient Rx  Name  Route  Sig  Dispense  Refill  . aspirin EC 81 MG tablet   Oral   Take 81 mg by mouth daily.         . clopidogrel (PLAVIX) 75 MG tablet   Oral   Take 1 tablet (75 mg total) by mouth daily.   30 tablet   11   . clotrimazole-betamethasone (LOTRISONE) cream   Topical   Apply topically 3 (three) times daily. As needed for rash   45 g   2   . colchicine 0.6 MG tablet   Oral   Take 0.6 mg by mouth daily as needed (for gout flares).          . furosemide (LASIX) 40 MG tablet   Oral   Take 40 mg by mouth daily.          Marland Kitchen gabapentin (NEURONTIN) 400 MG capsule   Oral   Take 400 mg by mouth at bedtime.         . insulin NPH (HUMULIN N) 100 UNIT/ML injection   Subcutaneous   Inject 110 Units into the skin every morning.         . isosorbide dinitrate (ISORDIL) 20 MG tablet   Oral   Take 20 mg by mouth at bedtime.          . metoprolol tartrate (LOPRESSOR) 12.5 mg TABS tablet   Oral   Take 0.5 tablets (12.5 mg total) by mouth 2 (two) times daily.   60 tablet   1   . nitroGLYCERIN (NITROSTAT) 0.4 MG SL tablet   Sublingual   Place 0.4 mg under the tongue every  5 (five) minutes as needed for chest pain. Not to exceed 3 in 15 minute time frame         .  oxyCODONE-acetaminophen (PERCOCET) 10-325 MG per tablet   Oral   Take 1 tablet by mouth every 6 (six) hours as needed for pain.   30 tablet   0   . quinapril (ACCUPRIL) 5 MG tablet   Oral   Take 5 mg by mouth daily.          There were no vitals taken for this visit.  Physical Exam  Nursing note and vitals reviewed. Constitutional: She appears well-developed and well-nourished. No distress.  HENT:  Head: Normocephalic and atraumatic.  Eyes: Conjunctivae are normal. Right eye exhibits no discharge. Left eye exhibits no discharge.  Neck: Neck supple.  Cardiovascular: Normal rate, regular rhythm and normal heart sounds.  Exam reveals no gallop and no friction rub.   No murmur heard. Pulmonary/Chest: Effort normal and breath sounds normal. No respiratory distress.  Abdominal: Soft. She exhibits no distension. There is no tenderness.  Musculoskeletal: She exhibits tenderness. She exhibits no edema.  Tenderness to palpation right distal foot and toes Small abrasion with surrounding ecchymosis to the right lateral shin No midline spinal tenderness  Neurological: She is alert. No cranial nerve deficit.  Strength 5/5 lower extremities bilaterally  Skin: Skin is warm and dry.  Psychiatric: She has a normal mood and affect. Her behavior is normal. Thought content normal.    ED Course  Procedures (including critical care time)  DIAGNOSTIC STUDIES:  COORDINATION OF CARE: 2:16 PM-Informed pt that EKG is abnormal.  Discussed treatment plan which includes labs, imaging, and admission with pt at bedside and pt agreed to plan.     Labs Review Labs Reviewed  CBC WITH DIFFERENTIAL - Abnormal; Notable for the following:    RBC 3.79 (*)    Hemoglobin 11.7 (*)    HCT 33.7 (*)    All other components within normal limits  GLUCOSE, CAPILLARY - Abnormal; Notable for the following:    Glucose-Capillary 195 (*)    All other components within normal limits  BASIC METABOLIC PANEL  TROPONIN  I   Imaging Review Dg Tibia/fibula Right  10/26/2013   CLINICAL DATA:  Leg pain following injury  EXAM: RIGHT TIBIA AND FIBULA - 2 VIEW  COMPARISON:  None.  FINDINGS: There is no evidence of fracture or other focal bone lesions. Mild soft tissue swelling is noted anteriorly over the mid tibia. .  IMPRESSION: No acute bony abnormality is noted.   Electronically Signed   By: Inez Catalina M.D.   On: 10/26/2013 14:35   Dg Ankle Complete Right  10/26/2013   CLINICAL DATA:  Right ankle pain following a fall.  EXAM: RIGHT ANKLE - COMPLETE 3+ VIEW  COMPARISON:  None.  FINDINGS: Calcaneal spurs.  No fracture, dislocation or effusion seen.  IMPRESSION: No fracture.   Electronically Signed   By: Enrique Sack M.D.   On: 10/26/2013 14:39   Dg Foot Complete Right  10/26/2013   CLINICAL DATA:  Right foot pain following a fall.  EXAM: RIGHT FOOT COMPLETE - 3+ VIEW  COMPARISON:  None.  FINDINGS: Nondisplaced fracture in the base of the second proximal phalanx, medially. No intra-articular extension seen. Skin fold overlying the first proximal phalanx on the oblique view. No fracture at that location on the other views. Questionable linear lucency in the base of the fifth metatarsal. Calcaneal spurs.  IMPRESSION: 1. Nondisplaced fracture  in the base of the second proximal phalanx without intra-articular extension. 2. Probable intertrabecular lucency in the base of the fifth metatarsal. A nondisplaced fracture is less likely.   Electronically Signed   By: Enrique Sack M.D.   On: 10/26/2013 14:38    EKG Interpretation    Date/Time:  Sunday October 26 2013 13:48:01 EST Ventricular Rate:  67 PR Interval:  174 QRS Duration: 104 QT Interval:  450 QTC Calculation: 475 R Axis:   54 Text Interpretation:  Normal sinus rhythm Abnormal ECG When compared with ECG of 19-Apr-2013 08:10, ST no longer depressed in Lateral leads T wave inversion now evident in Anterolateral leads Confirmed by Jearld Hemp  MD, Toinette Lackie ([id number]) on 10/26/2013  2:01:51 PM            MDM   1. Syncope   2. Dehydration   3. AKI (acute kidney injury)   4. Closed fracture of second toe of right foot     57  year old female with syncope. EKG changes which are new from prior EKG. Workup significant for acute kidney injury. Noted to be hypotensive as well. IV fluid bolus and then run at a rate. She denies any chest pain or any other symptoms particularly concerning for possible anginal equivalent aside from dizziness intermitrent. Troponin is normal. Imaging significant for a nondisplaced fracture the proximal phalanx second toe. Pain control. Postop shoe.  Routine ortho followup.    I personally preformed the services scribed in my presence. The recorded information has been reviewed is accurate. Virgel Manifold, MD.    Virgel Manifold, MD 10/26/13 [phone]

## 2013-10-26 NOTE — H&P (Addendum)
History and Physical  Donna Howe [id number] DOB: 1956/05/15 DOA: 10/26/2013  Referring physician: EDP PCP: Rory Percy, MD  Outpatient Specialists:  1. Cardiology: Dr. Dola Argyle. 2. Vascular Surgeon: Dr. Tinnie Gens  Chief Complaint: Passed out  HPI: Donna Howe is a 58 y.o. female with extensive PMH-CAD, DES stents to LM & [facility] 12/19/12, CABG, EF 60% by catheterization April 2014, hypertension, hyperlipidemia, obesity, asthma, gout, type II DM with renal complications, PAD, carotid artery disease, status post right carotid endarterectomy and patch angioplasty, stage III chronic kidney disease, presented to the ED following an episode of passing out at home. She gives 4-5 months history of intermittent dizziness which are brought on by upright and not in supine position. She denies history of vertigo, tinnitus or deafness. She states that she may have the symptoms daily for a couple of days and then relief for a few days. Some of her symptoms have been attributed to polypharmacy and medications are being titrated down. At approximately 11 AM on 2/8, she got up from her couch to turn down the heat thermostat. She had walked a few steps, reached for the thermostat, started feeling dizzy and then doesn't remember anything. She woke up on the floor and her boyfriend was calling out. She denied preceding chest pain, palpitations or dyspnea. She felt right shin swelling, pain and foot pain and when she tried to sit up, she continued to feel dizzy. She also had a sore head and neck and might have hit her head to the floor. No nasal or oral bleeding or drainage. She denies any strokelike symptoms. She was able to drag herself up and sit on the couch. She denies prior such episodes. Prior to this event, she was in her usual state of health without complaints. No history of fever, chills, nausea, vomiting, diarrhea, cough. She has normal urine output. In the ED, glucose 202, creatinine 2.7,  hemoglobin 11.7, troponin x1 negative, right foot x-ray shows nondisplaced fracture in the base of the second proximal phalanx. EKG with some new changes. Hospitalist admission requested.   Review of Systems: All systems reviewed and apart from history of presenting illness, are negative.  Past Medical History  Diagnosis Date  . Hypercholesteremia   . Hypertension   . HYPERLIPIDEMIA 03/31/2007  . Overweight 08/26/2009  . HYPERTENSION 03/31/2007  . CAD (coronary artery disease) 03/31/2007  . ASTHMA 08/26/2009  . Gout   . SBO (small bowel obstruction) 2011    lap lysis of adhesions & hernia repair  . Hx of CABG     2005  . Ejection fraction     Normal EF echo 2010  //   EF 60%, catheterization, April, 2014  . Anginal pain   . Shortness of breath   . Diabetes mellitus   . DIABETES MELLITUS, TYPE II 03/31/2007  . Heart murmur   . PAD (peripheral artery disease)     Dr. Kellie Simmering  . Carotid artery occlusion   . RENAL FAILURE 03/31/2007    NOT ON HD ( PCP ONLY DR Rory Percy)  . Chronic kidney disease (CKD), stage III (moderate)   . Arthritis     GOUT  . Carotid artery disease   . Umbilical hernia    Past Surgical History  Procedure Laterality Date  . Cholecystectomy  2010  . Hernia repair  1989    open w mesh  . Cesarean section  1984  . Cardiac catheterization  07/18/2004  . Cardiac stents  2006-2007  . Incisional hernia repair x2  03/04/2010     laparoscopic with 35cm mesh by Dr Ronnald Collum  . Shoulder surgery    . Coronary artery bypass graft  2005  . Coronary angioplasty with stent placement  12/19/2012    LM & [facility]    DES  . Endarterectomy Right 04/18/2013    Procedure: ENDARTERECTOMY CAROTID;  Surgeon: Mal Misty, MD;  Location: [place];  Service: Vascular;  Laterality: Right;  . Patch angioplasty Right 04/18/2013    Procedure: PATCH ANGIOPLASTY Right Internal Carotid Artery;  Surgeon: Mal Misty, MD;  Location: [place];  Service: Vascular;  Laterality: Right;   Social  History:  reports that she quit smoking about 10 months ago. Her smoking use included Cigarettes. She has a 20 pack-year smoking history. She has never used smokeless tobacco. She reports that she does not drink alcohol or use illicit drugs. Patient lives with her boyfriend and is independent of activities of daily living. Quit smoking in March 2014.  Allergies  Allergen Reactions  . Penicillins Other (See Comments)    REACTION: Unknown, told as a child    Family History  Problem Relation Age of Onset  . Diabetes Mother   . Heart disease Mother   . Hyperlipidemia Mother   . Hypertension Mother   . Thyroid disease Father   . Hypertension Father   . AAA (abdominal aortic aneurysm) Father   . Heart disease Brother   . Hyperlipidemia Son   . Hypertension Son     Prior to Admission medications   Medication Sig Start Date End Date Taking? Authorizing Provider  aspirin EC 81 MG tablet Take 81 mg by mouth daily.   Yes Historical Provider, MD  clopidogrel (PLAVIX) 75 MG tablet Take 1 tablet (75 mg total) by mouth daily. 12/20/12  Yes Rhonda G Barrett, PA-C  clotrimazole-betamethasone (LOTRISONE) cream Apply topically 3 (three) times daily. As needed for rash 04/29/13  Yes Renato Shin, MD  colchicine 0.6 MG tablet Take 0.6 mg by mouth daily.    Yes Historical Provider, MD  furosemide (LASIX) 40 MG tablet Take 40 mg by mouth daily.  03/19/13  Yes Historical Provider, MD  gabapentin (NEURONTIN) 400 MG capsule Take 400 mg by mouth at bedtime.   Yes Rory Percy, MD  insulin NPH (HUMULIN N) 100 UNIT/ML injection Inject 110 Units into the skin every morning.   Yes Historical Provider, MD  isosorbide dinitrate (ISORDIL) 20 MG tablet Take 20 mg by mouth at bedtime.  12/20/12  Yes Rhonda G Barrett, PA-C  levothyroxine (SYNTHROID, LEVOTHROID) 88 MCG tablet Take 88 mcg by mouth daily. 10/02/13  Yes Historical Provider, MD  metolazone (ZAROXOLYN) 5 MG tablet Take 5 mg by mouth every Monday, Wednesday, and  Friday. 10/19/13  Yes Historical Provider, MD  oxyCODONE-acetaminophen (PERCOCET) 10-325 MG per tablet Take 1 tablet by mouth every 6 (six) hours as needed for pain. 04/18/13  Yes Samantha J Rhyne, PA-C  quinapril (ACCUPRIL) 5 MG tablet Take 5 mg by mouth daily. 12/20/12  Yes Rhonda G Barrett, PA-C  nitroGLYCERIN (NITROSTAT) 0.4 MG SL tablet Place 0.4 mg under the tongue every 5 (five) minutes as needed for chest pain. Not to exceed 3 in 15 minute time frame    Historical Provider, MD   Physical Exam: Filed Vitals:   10/26/13 1459 10/26/13 1651  BP: 116/42 121/47  Pulse: 62 65  Temp: 98 F (36.7 C)   TempSrc: Oral   Resp: 19  18  SpO2: 97% 100%     General exam: Moderately built and obese female patient, lying comfortably supine on the gurney in no obvious distress.  Head, eyes and ENT: Nontraumatic and normocephalic. Pupils equally reacting to light and accommodation. Oral mucosa with borderline hydration.  Neck: Supple. No JVD, carotid bruit or thyromegaly. Carotid endarterectomy scar across right side of neck.  Lymphatics: No lymphadenopathy.  Respiratory system: Clear to auscultation. No increased work of breathing.  Cardiovascular system: S1 and S2 heard, RRR. No JVD, murmurs, gallops, clicks. Trace bilateral ankle edema.  Gastrointestinal system: Abdomen is obese, soft and nontender. Normal bowel sounds heard. No organomegaly or masses appreciated.  Central nervous system: Alert and oriented. No focal neurological deficits.  Extremities: Symmetric 5 x 5 power. Peripheral pulses symmetrically felt. Tiny abrasion with surrounding ecchymosis over right lower anterior shin. Tenderness to palpation of distal right forefoot and toes, especially right second toe.  Skin: No rashes or acute findings.  Musculoskeletal system: Negative exam.  Psychiatry: Pleasant and cooperative.   Labs on Admission:  Basic Metabolic Panel:  Recent Labs Lab 10/26/13 1354  NA 134*  K 3.7  CL 89*   CO2 26  GLUCOSE 202*  BUN 72*  CREATININE 2.70*  CALCIUM 10.0   Liver Function Tests: No results found for this basename: AST, ALT, ALKPHOS, BILITOT, PROT, ALBUMIN,  in the last 168 hours No results found for this basename: LIPASE, AMYLASE,  in the last 168 hours No results found for this basename: AMMONIA,  in the last 168 hours CBC:  Recent Labs Lab 10/26/13 1354  WBC 5.1  NEUTROABS 2.8  HGB 11.7*  HCT 33.7*  MCV 88.9  PLT 152   Cardiac Enzymes:  Recent Labs Lab 10/26/13 1354  TROPONINI <0.30    BNP (last 3 results) No results found for this basename: PROBNP,  in the last 8760 hours CBG:  Recent Labs Lab 10/26/13 1357  GLUCAP 195*    Radiological Exams on Admission: Dg Tibia/fibula Right  10/26/2013   CLINICAL DATA:  Leg pain following injury  EXAM: RIGHT TIBIA AND FIBULA - 2 VIEW  COMPARISON:  None.  FINDINGS: There is no evidence of fracture or other focal bone lesions. Mild soft tissue swelling is noted anteriorly over the mid tibia. .  IMPRESSION: No acute bony abnormality is noted.   Electronically Signed   By: Inez Catalina M.D.   On: 10/26/2013 14:35   Dg Ankle Complete Right  10/26/2013   CLINICAL DATA:  Right ankle pain following a fall.  EXAM: RIGHT ANKLE - COMPLETE 3+ VIEW  COMPARISON:  None.  FINDINGS: Calcaneal spurs.  No fracture, dislocation or effusion seen.  IMPRESSION: No fracture.   Electronically Signed   By: Enrique Sack M.D.   On: 10/26/2013 14:39   Dg Foot Complete Right  10/26/2013   CLINICAL DATA:  Right foot pain following a fall.  EXAM: RIGHT FOOT COMPLETE - 3+ VIEW  COMPARISON:  None.  FINDINGS: Nondisplaced fracture in the base of the second proximal phalanx, medially. No intra-articular extension seen. Skin fold overlying the first proximal phalanx on the oblique view. No fracture at that location on the other views. Questionable linear lucency in the base of the fifth metatarsal. Calcaneal spurs.  IMPRESSION: 1. Nondisplaced fracture in  the base of the second proximal phalanx without intra-articular extension. 2. Probable intertrabecular lucency in the base of the fifth metatarsal. A nondisplaced fracture is less likely.   Electronically Signed   By:  Enrique Sack M.D.   On: 10/26/2013 14:38    EKG: Independently reviewed. Normal sinus rhythm at 67 beats per minute, normal axis, and diffuse T-wave inversions in anterolateral and septal leads (new compared to prior 04/19/13). No other acute findings. QTC 475 ms Assessment/Plan Principal Problem:   Syncope Active Problems:   HYPERLIPIDEMIA   HYPERTENSION   ASTHMA   CAD (coronary artery disease)   Hx of CABG   Carotid artery disease   Anemia   Renal failure, acute on chronic   Fracture of toe of right foot   Syncope and collapse   1. Syncope: Presentation suspicious for orthostatic hypotension. Other DD-rule out arrhythmias, worsening carotid artery disease. Admit to telemetry. Check orthostatic blood pressures, troponins x3, 2-D echo and carotid Doppler. PT evaluation. 2. Acute on stage III chronic kidney disease: Baseline creatinine (1.3-1.4). Possibly secondary to diuretics (Zaroxolyn and Lasix) and ACE inhibitors. Hold these medications. Brief IV fluid hydration and follow BMP. If no improvement, consider renal ultrasound. 3. Closed fracture of second toe of right foot: Pain control and postop shoe. Outpatient orthopedic followup. 4. Uncontrolled type II DM with renal complications, CAD, PAD: Continue reduced dose of a.m. NPH insulin and add NovoLog SSI. Monitor closely. 5. History of hypertension: Soft blood pressures. Hold antihypertensives. 6. CAD status post CABG and stents: Cardiac cath in 2014 showed normal EF. No acute EKG changes and troponin x1 negative. Asymptomatic. Continue aspirin, Plavix and cycle troponins. 7. Anemia: Possibly from chronic kidney disease. Stable. Follow CBCs. 8. History of carotid disease, status post right carotid endarterectomy: Follow  carotid Dopplers 9. History of gout, asthma, hyperlipidemia: Stable     Code Status: Full  Family Communication: None at bedside  Disposition Plan: Home when medically stable   Time spent: 39 minutes  Jaeden Westbay, MD, FACP, FHM. [facility] Pager [phone]  If 7PM-7AM, please contact night-coverage [email or web address] Password [email or web address] 10/26/2013, 5:09 PM

## 2013-10-26 NOTE — ED Notes (Signed)
Pt reports has been having problems with dizziness and today had syncopal episode and fell.  C/O pain to r lower leg and toes.  Swelling noted to r shin and abrasion 2nd toe on r foot.

## 2013-10-27 ENCOUNTER — Inpatient Hospital Stay (HOSPITAL_COMMUNITY): Payer: Medicare Other

## 2013-10-27 DIAGNOSIS — E876 Hypokalemia: Secondary | ICD-10-CM

## 2013-10-27 DIAGNOSIS — N189 Chronic kidney disease, unspecified: Secondary | ICD-10-CM

## 2013-10-27 DIAGNOSIS — R55 Syncope and collapse: Secondary | ICD-10-CM

## 2013-10-27 DIAGNOSIS — E1129 Type 2 diabetes mellitus with other diabetic kidney complication: Secondary | ICD-10-CM

## 2013-10-27 DIAGNOSIS — N179 Acute kidney failure, unspecified: Secondary | ICD-10-CM

## 2013-10-27 LAB — GLUCOSE, CAPILLARY
GLUCOSE-CAPILLARY: 110 mg/dL — AB (ref 70–99)
GLUCOSE-CAPILLARY: 83 mg/dL (ref 70–99)
Glucose-Capillary: 125 mg/dL — ABNORMAL HIGH (ref 70–99)
Glucose-Capillary: 59 mg/dL — ABNORMAL LOW (ref 70–99)
Glucose-Capillary: 59 mg/dL — ABNORMAL LOW (ref 70–99)
Glucose-Capillary: 111 mg/dL — ABNORMAL HIGH (ref 70–99)

## 2013-10-27 LAB — CBC
HCT: 30.2 % — ABNORMAL LOW (ref 36.0–46.0)
HEMOGLOBIN: 10.3 g/dL — AB (ref 12.0–15.0)
MCH: 31.3 pg (ref 26.0–34.0)
MCHC: 34.1 g/dL (ref 30.0–36.0)
MCV: 91.8 fL (ref 78.0–100.0)
PLATELETS: 132 10*3/uL — AB (ref 150–400)
RBC: 3.29 MIL/uL — ABNORMAL LOW (ref 3.87–5.11)
RDW: 13.8 % (ref 11.5–15.5)
WBC: 5.8 10*3/uL (ref 4.0–10.5)

## 2013-10-27 LAB — BASIC METABOLIC PANEL
BUN: 72 mg/dL — AB (ref 6–23)
CALCIUM: 8.8 mg/dL (ref 8.4–10.5)
CO2: 31 mEq/L (ref 19–32)
CREATININE: 2.85 mg/dL — AB (ref 0.50–1.10)
Chloride: 96 mEq/L (ref 96–112)
GFR calc Af Amer: 20 mL/min — ABNORMAL LOW (ref >90)
GFR calc non Af Amer: 18 mL/min — ABNORMAL LOW (ref >90)
GLUCOSE: 122 mg/dL — AB (ref 70–99)
POTASSIUM: 3.3 meq/L — AB (ref 3.7–5.3)
Sodium: 141 mEq/L (ref 137–147)

## 2013-10-27 LAB — HEPATITIS B SURFACE ANTIGEN: HEP B S AG: NEGATIVE

## 2013-10-27 LAB — TROPONIN I: Troponin I: <0.3 ng/mL (ref <0.30)

## 2013-10-27 LAB — HEPATITIS C ANTIBODY: HCV AB: NEGATIVE

## 2013-10-27 LAB — CK: Total CK: 80 U/L (ref 7–177)

## 2013-10-27 MED ORDER — INSULIN NPH (HUMAN) (ISOPHANE) 100 UNIT/ML ~~LOC~~ SUSP: 60.0000 [IU] | Freq: Every day | SUBCUTANEOUS | Status: DC

## 2013-10-27 MED ORDER — INSULIN NPH (HUMAN) (ISOPHANE) 100 UNIT/ML ~~LOC~~ SUSP
90.0000 [IU] | Freq: Every day | SUBCUTANEOUS | Status: DC
  Administered 2013-10-27: 90 [IU] via SUBCUTANEOUS
  Filled 2013-10-27: qty 10

## 2013-10-27 MED ORDER — POTASSIUM CHLORIDE CRYS ER 20 MEQ PO TBCR
20.0000 meq | EXTENDED_RELEASE_TABLET | Freq: Once | ORAL | Status: AC
  Administered 2013-10-27: 20 meq via ORAL
  Filled 2013-10-27: qty 1

## 2013-10-27 MED ORDER — COLCHICINE 0.6 MG PO TABS
0.6000 mg | ORAL_TABLET | ORAL | Status: DC
  Administered 2013-10-29: 0.6 mg via ORAL
  Filled 2013-10-27: qty 1

## 2013-10-27 NOTE — Progress Notes (Signed)
Addendum  Patient went hypoglycemic with CBG of 59 this evening despite reducing her home dose of NPH insulin from 110 > 90 units daily. This could be due to acute renal failure. Will reduce NPH to 60 units daily from 10/28/13 and monitor closely.  Vernell Leep, MD, FACP, FHM. [facility] Pager [phone]  If 7PM-7AM, please contact night-coverage [email or web address] Password [email or web address] 10/27/2013, 5:35 PM

## 2013-10-27 NOTE — Consult Note (Signed)
Reason for Consult: Acute kidney injury superimposed on chronic Referring Physician: Dr. Jaclyn Shaggy D Donna Howe is an 58 y.o. female.  HPI: She is a patient was history of diabetes, hypertension, coronary artery disease status post CABG and recent stent placement presently came with complaints of dizziness for the last couple of months. Patient states that she feels dizzy and lightheaded on and off mostly when she gets up. Yesterday patient says that she has similar episode and does not remember what happened which wakes up she found herself on the floor. Because of that patient was poor to the emergency room. Presently she denies any difficulty breathing and dizziness is better but still she has on and off. Patient does not have any nausea or vomiting. patient states that she was told to have some problems kidneys but she doesn't know what the extent. She didn't have any history of kidney stone.  Past Medical History  Diagnosis Date  . Hypercholesteremia   . Hypertension   . HYPERLIPIDEMIA 03/31/2007  . Overweight 08/26/2009  . HYPERTENSION 03/31/2007  . CAD (coronary artery disease) 03/31/2007  . ASTHMA 08/26/2009  . Gout   . SBO (small bowel obstruction) 2011    lap lysis of adhesions & hernia repair  . Hx of CABG     2005  . Ejection fraction     Normal EF echo 2010  //   EF 60%, catheterization, April, 2014  . Anginal pain   . Shortness of breath   . Diabetes mellitus   . DIABETES MELLITUS, TYPE II 03/31/2007  . Heart murmur   . PAD (peripheral artery disease)     Dr. Kellie Simmering  . Carotid artery occlusion   . RENAL FAILURE 03/31/2007    NOT ON HD ( PCP ONLY DR Rory Percy)  . Chronic kidney disease (CKD), stage III (moderate)   . Arthritis     GOUT  . Carotid artery disease   . Umbilical hernia     Past Surgical History  Procedure Laterality Date  . Cholecystectomy  2010  . Hernia repair  1989    open w mesh  . Cesarean section  1984  . Cardiac catheterization  07/18/2004   . Cardiac stents  2006-2007  . Incisional hernia repair x2  03/04/2010     laparoscopic with 35cm mesh by Dr Ronnald Collum  . Shoulder surgery    . Coronary artery bypass graft  2005  . Coronary angioplasty with stent placement  12/19/2012    [facility]    DES  . Endarterectomy Right 04/18/2013    Procedure: ENDARTERECTOMY CAROTID;  Surgeon: Mal Misty, MD;  Location: [place];  Service: Vascular;  Laterality: Right;  . Patch angioplasty Right 04/18/2013    Procedure: PATCH ANGIOPLASTY Right Internal Carotid Artery;  Surgeon: Mal Misty, MD;  Location: [facility];  Service: Vascular;  Laterality: Right;    Family History  Problem Relation Age of Onset  . Diabetes Mother   . Heart disease Mother   . Hyperlipidemia Mother   . Hypertension Mother   . Thyroid disease Father   . Hypertension Father   . AAA (abdominal aortic aneurysm) Father   . Heart disease Brother   . Hyperlipidemia Son   . Hypertension Son     Social History:  reports that she quit smoking about 10 months ago. Her smoking use included Cigarettes. She has a 20 pack-year smoking history. She has never used smokeless tobacco. She reports that she does  not drink alcohol or use illicit drugs.  Allergies:  Allergies  Allergen Reactions  . Penicillins Other (See Comments)    REACTION: Unknown, told as a child    Medications: I have reviewed the patient's current medications.  Results for orders placed during the hospital encounter of 10/26/13 (from the past 48 hour(s))  CBC WITH DIFFERENTIAL     Status: Abnormal   Collection Time    10/26/13  1:54 PM      Result Value Range   WBC 5.1  4.0 - 10.5 K/uL   RBC 3.79 (*) 3.87 - 5.11 MIL/uL   Hemoglobin 11.7 (*) 12.0 - 15.0 g/dL   HCT 33.7 (*) 36.0 - 46.0 %   MCV 88.9  78.0 - 100.0 fL   MCH 30.9  26.0 - 34.0 pg   MCHC 34.7  30.0 - 36.0 g/dL   RDW 13.5  11.5 - 15.5 %   Platelets 152  150 - 400 K/uL   Neutrophils Relative % 54  43 - 77 %   Neutro Abs 2.8  1.7 - 7.7 K/uL    Lymphocytes Relative 35  12 - 46 %   Lymphs Abs 1.8  0.7 - 4.0 K/uL   Monocytes Relative 9  3 - 12 %   Monocytes Absolute 0.4  0.1 - 1.0 K/uL   Eosinophils Relative 2  0 - 5 %   Eosinophils Absolute 0.1  0.0 - 0.7 K/uL   Basophils Relative 0  0 - 1 %   Basophils Absolute 0.0  0.0 - 0.1 K/uL  BASIC METABOLIC PANEL     Status: Abnormal   Collection Time    10/26/13  1:54 PM      Result Value Range   Sodium 134 (*) 137 - 147 mEq/L   Potassium 3.7  3.7 - 5.3 mEq/L   Chloride 89 (*) 96 - 112 mEq/L   CO2 26  19 - 32 mEq/L   Glucose, Bld 202 (*) 70 - 99 mg/dL   BUN 72 (*) 6 - 23 mg/dL   Creatinine, Ser 2.70 (*) 0.50 - 1.10 mg/dL   Calcium 10.0  8.4 - 10.5 mg/dL   GFR calc non Af Amer 19 (*) >90 mL/min   GFR calc Af Amer 22 (*) >90 mL/min   Comment: (NOTE)     The eGFR has been calculated using the CKD EPI equation.     This calculation has not been validated in all clinical situations.     eGFR's persistently <90 mL/min signify possible Chronic Kidney     Disease.  TROPONIN I     Status: None   Collection Time    10/26/13  1:54 PM      Result Value Range   Troponin I <0.30  <0.30 ng/mL   Comment:            Due to the release kinetics of cTnI,     a negative result within the first hours     of the onset of symptoms does not rule out     myocardial infarction with certainty.     If myocardial infarction is still suspected,     repeat the test at appropriate intervals.  GLUCOSE, CAPILLARY     Status: Abnormal   Collection Time    10/26/13  1:57 PM      Result Value Range   Glucose-Capillary 195 (*) 70 - 99 mg/dL  TROPONIN I     Status: None   Collection Time  10/26/13  5:31 PM      Result Value Range   Troponin I <0.30  <0.30 ng/mL   Comment:            Due to the release kinetics of cTnI,     a negative result within the first hours     of the onset of symptoms does not rule out     myocardial infarction with certainty.     If myocardial infarction is still  suspected,     repeat the test at appropriate intervals.  GLUCOSE, CAPILLARY     Status: Abnormal   Collection Time    10/26/13  9:39 PM      Result Value Range   Glucose-Capillary 159 (*) 70 - 99 mg/dL  URINALYSIS, ROUTINE W REFLEX MICROSCOPIC     Status: Abnormal   Collection Time    10/26/13 10:29 PM      Result Value Range   Color, Urine YELLOW  YELLOW   APPearance HAZY (*) CLEAR   Specific Gravity, Urine 1.010  1.005 - 1.030   pH 6.0  5.0 - 8.0   Glucose, UA NEGATIVE  NEGATIVE mg/dL   Hgb urine dipstick NEGATIVE  NEGATIVE   Bilirubin Urine NEGATIVE  NEGATIVE   Ketones, ur NEGATIVE  NEGATIVE mg/dL   Protein, ur NEGATIVE  NEGATIVE mg/dL   Urobilinogen, UA 0.2  0.0 - 1.0 mg/dL   Nitrite NEGATIVE  NEGATIVE   Leukocytes, UA NEGATIVE  NEGATIVE   Comment: MICROSCOPIC NOT DONE ON URINES WITH NEGATIVE PROTEIN, BLOOD, LEUKOCYTES, NITRITE, OR GLUCOSE <1000 mg/dL.  TROPONIN I     Status: None   Collection Time    10/26/13 11:36 PM      Result Value Range   Troponin I <0.30  <0.30 ng/mL   Comment:            Due to the release kinetics of cTnI,     a negative result within the first hours     of the onset of symptoms does not rule out     myocardial infarction with certainty.     If myocardial infarction is still suspected,     repeat the test at appropriate intervals.  TROPONIN I     Status: None   Collection Time    10/27/13  4:53 AM      Result Value Range   Troponin I <0.30  <0.30 ng/mL   Comment:            Due to the release kinetics of cTnI,     a negative result within the first hours     of the onset of symptoms does not rule out     myocardial infarction with certainty.     If myocardial infarction is still suspected,     repeat the test at appropriate intervals.  BASIC METABOLIC PANEL     Status: Abnormal   Collection Time    10/27/13  4:53 AM      Result Value Range   Sodium 141  137 - 147 mEq/L   Comment: DELTA CHECK NOTED   Potassium 3.3 (*) 3.7 - 5.3 mEq/L    Chloride 96  96 - 112 mEq/L   CO2 31  19 - 32 mEq/L   Glucose, Bld 122 (*) 70 - 99 mg/dL   BUN 72 (*) 6 - 23 mg/dL   Creatinine, Ser 2.85 (*) 0.50 - 1.10 mg/dL   Calcium 8.8  8.4 - 10.5 mg/dL  GFR calc non Af Amer 18 (*) >90 mL/min   GFR calc Af Amer 20 (*) >90 mL/min   Comment: (NOTE)     The eGFR has been calculated using the CKD EPI equation.     This calculation has not been validated in all clinical situations.     eGFR's persistently <90 mL/min signify possible Chronic Kidney     Disease.  CBC     Status: Abnormal   Collection Time    10/27/13  4:53 AM      Result Value Range   WBC 5.8  4.0 - 10.5 K/uL   RBC 3.29 (*) 3.87 - 5.11 MIL/uL   Hemoglobin 10.3 (*) 12.0 - 15.0 g/dL   HCT 30.2 (*) 36.0 - 46.0 %   MCV 91.8  78.0 - 100.0 fL   MCH 31.3  26.0 - 34.0 pg   MCHC 34.1  30.0 - 36.0 g/dL   RDW 13.8  11.5 - 15.5 %   Platelets 132 (*) 150 - 400 K/uL  GLUCOSE, CAPILLARY     Status: Abnormal   Collection Time    10/27/13  7:28 AM      Result Value Range   Glucose-Capillary 110 (*) 70 - 99 mg/dL   Comment 1 Documented in Chart     Comment 2 Notify RN      Dg Tibia/fibula Right  10/26/2013   CLINICAL DATA:  Leg pain following injury  EXAM: RIGHT TIBIA AND FIBULA - 2 VIEW  COMPARISON:  None.  FINDINGS: There is no evidence of fracture or other focal bone lesions. Mild soft tissue swelling is noted anteriorly over the mid tibia. .  IMPRESSION: No acute bony abnormality is noted.   Electronically Signed   By: Inez Catalina M.D.   On: 10/26/2013 14:35   Dg Ankle Complete Right  10/26/2013   CLINICAL DATA:  Right ankle pain following a fall.  EXAM: RIGHT ANKLE - COMPLETE 3+ VIEW  COMPARISON:  None.  FINDINGS: Calcaneal spurs.  No fracture, dislocation or effusion seen.  IMPRESSION: No fracture.   Electronically Signed   By: Enrique Sack M.D.   On: 10/26/2013 14:39   US Carotid Duplex Bilateral  10/27/2013   CLINICAL DATA:  Syncope.  EXAM: BILATERAL CAROTID DUPLEX ULTRASOUND   TECHNIQUE: Pearline Cables scale imaging, color Doppler and duplex ultrasound were performed of bilateral carotid and vertebral arteries in the neck.  COMPARISON:  None.  FINDINGS: Criteria: Quantification of carotid stenosis is based on velocity parameters that correlate the residual internal carotid diameter with NASCET-based stenosis levels, using the diameter of the distal internal carotid lumen as the denominator for stenosis measurement.  The following velocity measurements were obtained:  RIGHT  ICA:  172 cm/sec  CCA:  79 cm/sec  SYSTOLIC ICA/CCA RATIO:  0.09  DIASTOLIC ICA/CCA RATIO:  3.81  ECA:  155 cm/sec  LEFT  ICA:  88 cm/sec  CCA:  829 cm/sec  SYSTOLIC ICA/CCA RATIO:  9.37  DIASTOLIC ICA/CCA RATIO:  1.69  ECA:  64 cm/sec  RIGHT CAROTID ARTERY: Mild intimal thickening along the distal common carotid artery with minor plaque in the carotid bulb. Heterogeneous noncalcified plaque is noted in the internal carotid artery just beyond its origin. Grayscale narrowing does not appear significant, but there is a velocity elevation is well as turbulent flow suggesting a more significant stenosis.  RIGHT VERTEBRAL ARTERY:  Normal antegrade flow documented.  LEFT CAROTID ARTERY: Mild generalized intimal thickening along the carina carotid artery and mild  noncalcified plaque in the carotid bulb, but no hemodynamically significant stenosis.  LEFT VERTEBRAL ARTERY:  Normal antegrade flow documented.  IMPRESSION: 1. Based on the peak systolic flow velocity 111 centimeters/second and the ICA CCA peak systolic velocity ratio of 2.16, there is a 50- 69% stenosis of the right internal carotid artery. 2. No other evidence of a significant stenosis.   Electronically Signed   By: Lajean Manes M.D.   On: 10/27/2013 09:58   Dg Foot Complete Right  10/26/2013   CLINICAL DATA:  Right foot pain following a fall.  EXAM: RIGHT FOOT COMPLETE - 3+ VIEW  COMPARISON:  None.  FINDINGS: Nondisplaced fracture in the base of the second proximal  phalanx, medially. No intra-articular extension seen. Skin fold overlying the first proximal phalanx on the oblique view. No fracture at that location on the other views. Questionable linear lucency in the base of the fifth metatarsal. Calcaneal spurs.  IMPRESSION: 1. Nondisplaced fracture in the base of the second proximal phalanx without intra-articular extension. 2. Probable intertrabecular lucency in the base of the fifth metatarsal. A nondisplaced fracture is less likely.   Electronically Signed   By: Enrique Sack M.D.   On: 10/26/2013 14:38    Review of Systems  Respiratory: Negative for shortness of breath.   Cardiovascular: Positive for leg swelling. Negative for chest pain and claudication.  Gastrointestinal: Negative for nausea, vomiting, abdominal pain and diarrhea.  Neurological: Positive for weakness.  Endo/Heme/Allergies: Negative for polydipsia.   Blood pressure 96/62, pulse 71, temperature 97.6 F (36.4 C), temperature source Oral, resp. rate 18, height _0  (1.651 m), weight 111.403 kg (245 lb 9.6 oz), SpO2 100.00%. Physical Exam  Constitutional: She is oriented to person, place, and time. No distress.  Eyes: No scleral icterus.  Neck: No JVD present.  Cardiovascular: Normal rate and regular rhythm.   Respiratory: She has no wheezes. She has no rales.  GI: She exhibits distension. There is no tenderness. There is no rebound.  Musculoskeletal: She exhibits no edema.  Neurological: She is alert and oriented to person, place, and time.    Assessment/Plan: Problem #1 acute kidney injury: Presently may be secondary to prerenal syndrome as patient has been on diuretics/ATN/secondary to ACE inhibitor. Patient denies any use of nonsteroidal. Presently she is none oliguric however her BUN and creatinine remains high. Problem #2 chronic renal failure: Her creatinine was 1.131 02/21/2012 increased to 1.59 on 04/15/2013 and was 1.37 on 8/3  2014. Her EGFR was 43 cc per minute hence  stage IIIA. Etiology could be ischemic/hypertensive nephrosclerosis/diabetes. Problem #3 hypokalemia: Most likely secondary to diuretics including Lasix and metolazone. Problem #4 coronary disease: Status post CABG and history of recent stent placement Problem #5 hypertension : Her blood pressure is reasonably controlled. When she came her blood pressure systolic was as low as 94. Problem #5 history of carotid artery disease status post right carotid endarterectomy Problem #6 PAD Problem #7 obesity Problem #8 hypothyroidism Problem #9 abdominal hernia Plan: Agree with hydration and discontinuation of diuretics. We'll do ultrasound of the kidneys as patient doesn't have any previous workup. We'll check vitamin D level, intact PTH, phosphorus in the morning. Check ANA, complement, hepatitis B surface antigen, hepatitis C antibody. We'll increase her IV fluid to 135 cc per hour We'll do iron studies in the morning.  Fabiola Mudgett S 10/27/2013, 10:04 AM

## 2013-10-27 NOTE — Progress Notes (Signed)
UR chart review completed.  

## 2013-10-27 NOTE — Progress Notes (Signed)
PROGRESS NOTE    Ophelia Quilty Mcclellan [id number] DOB: Jan 16, 1956 DOA: 10/26/2013 PCP: Rory Percy, MD Outpatient Specialists:  1. Cardiology: Dr. Dola Argyle. 2. Vascular Surgeon: Dr. Tinnie Gens   HPI/Brief narrative 58 y.o. female with extensive PMH-CAD, DES stents to LM & [facility] 12/19/12, CABG, EF 60% by catheterization April 2014, hypertension, hyperlipidemia, obesity, asthma, gout, type II DM with renal complications, PAD, carotid artery disease, status post right carotid endarterectomy and patch angioplasty, stage III chronic kidney disease, presented to the ED following an episode of passing out at home. She gives 4-5 months history of intermittent dizziness which are brought on by upright and not in supine position.In the ED, glucose 202, creatinine 2.7, hemoglobin 11.7, troponin x1 negative, right foot x-ray shows nondisplaced fracture in the base of the second proximal phalanx. EKG with some new changes. Hospitalist admission requested.  Assessment/Plan:  1. Syncope: Possibly from orthostatic hypotension. Admitted to telemetry. Check orthostatic blood pressures-incomplete-but blood pressure dropped from 121/91 lying >96/62 mmHg sitting. Troponin x4: Negative. Echo-to be done. Carotid Doppler shows right 50-69% ICA stenosis (was 80-99% in July 2014). No arrhythmias on monitor. Still symptomatic of dizziness on sitting up. Hydrate with IV fluids. Hold antihypertensives and monitor. 3. Acute on stage III chronic kidney disease: Baseline creatinine (1.3-1.4). Possibly secondary to prerenal from diuretics (Zaroxolyn and Lasix)/ATN from hypotension/ ACE inhibitors. Held these medications. Continue IV fluid hydration. Creatinine has slightly worsened from 1.7 > 1.8. Renal ultrasound suggesting medical renal disease and no hydronephrosis. Nephrology consulted and input appreciated. Patient is nonoliguric. 4. Hypokalemia: Replete and follow. 5. Closed fracture of second toe of right foot: Pain  control and postop shoe. Outpatient orthopedic followup. 6. Uncontrolled type II DM with renal complications, CAD, PAD: Continue reduced dose of a.m. NPH insulin and add NovoLog SSI. Monitor closely. 7. History of hypertension: Blood pressures continue to be soft despite holding antihypertensives and hydrating with IVF. Hold antihypertensives. 8. CAD status post CABG and stents: Cardiac cath in 2014 showed normal EF. No acute EKG changes and troponin x1 negative. Asymptomatic. Continue aspirin, Plavix and cycle troponins. 9. Anemia: Possibly from chronic kidney disease. Stable. Follow CBCs. 10. History of carotid disease, status post right carotid endarterectomy: Carotid Doppler results as above. Outpatient followup with [facility]. 11. History of gout, asthma, hyperlipidemia: Stable    Code Status: Full Family Communication: None at bedside Disposition Plan: Home when medically stable   Consultants:  Nephrology  Procedures:  None  Antibiotics:  None   Subjective: Still feels dizzy on sitting up in bed. No further episodes of passing out. Denies dyspnea or chest pain. Right foot pain-worse with movements or weightbearing.  Objective: Filed Vitals:   10/26/13 2010 10/27/13 0440 10/27/13 0610 10/27/13 0611  BP: 94/32 100/34 121/91 96/62  Pulse: 62 68 66 71  Temp: 97.9 F (36.6 C) 97.6 F (36.4 C)    TempSrc: Oral Oral    Resp: 18 18    Height:      Weight:  111.403 kg (245 lb 9.6 oz)    SpO2: 100% 100% 100% 100%    Intake/Output Summary (Last 24 hours) at 10/27/13 1240 Last data filed at 10/27/13 1236  Gross per 24 hour  Intake 2771.08 ml  Output    850 ml  Net 1921.08 ml   Filed Weights   10/26/13 1722 10/27/13 0440  Weight: 109.8 kg (242 lb 1 oz) 111.403 kg (245 lb 9.6 oz)     Exam:  General exam: Pleasant comfortable female  sitting up in bed. Respiratory system: Clear. No increased work of breathing. Cardiovascular system: S1 & S2 heard, RRR. No JVD, murmurs,  gallops, clicks or pedal edema. Telemetry: Sinus rhythm. Gastrointestinal system: Abdomen is nondistended, soft and nontender. Normal bowel sounds heard. Central nervous system: Alert and oriented. No focal neurological deficits. Extremities: Symmetric 5 x 5 power. Peripheral pulses symmetrically felt. Tiny abrasion with surrounding ecchymosis over right lower anterior shin. Tenderness to palpation of distal right forefoot and toes, especially right second toe. Not significantly changed since admission    Data Reviewed: Basic Metabolic Panel:  Recent Labs Lab 10/26/13 1354 10/27/13 0453  NA 134* 141  K 3.7 3.3*  CL 89* 96  CO2 26 31  GLUCOSE 202* 122*  BUN 72* 72*  CREATININE 2.70* 2.85*  CALCIUM 10.0 8.8   Liver Function Tests: No results found for this basename: AST, ALT, ALKPHOS, BILITOT, PROT, ALBUMIN,  in the last 168 hours No results found for this basename: LIPASE, AMYLASE,  in the last 168 hours No results found for this basename: AMMONIA,  in the last 168 hours CBC:  Recent Labs Lab 10/26/13 1354 10/27/13 0453  WBC 5.1 5.8  NEUTROABS 2.8  --   HGB 11.7* 10.3*  HCT 33.7* 30.2*  MCV 88.9 91.8  PLT 152 132*   Cardiac Enzymes:  Recent Labs Lab 10/26/13 1354 10/26/13 1731 10/26/13 2336 10/27/13 0453  TROPONINI <0.30 <0.30 <0.30 <0.30   BNP (last 3 results) No results found for this basename: PROBNP,  in the last 8760 hours CBG:  Recent Labs Lab 10/26/13 1357 10/26/13 2139 10/27/13 0728 10/27/13 1119  GLUCAP 195* 159* 110* 125*    No results found for this or any previous visit (from the past 240 hour(s)).    Studies: Dg Tibia/fibula Right  10/26/2013   CLINICAL DATA:  Leg pain following injury  EXAM: RIGHT TIBIA AND FIBULA - 2 VIEW  COMPARISON:  None.  FINDINGS: There is no evidence of fracture or other focal bone lesions. Mild soft tissue swelling is noted anteriorly over the mid tibia. .  IMPRESSION: No acute bony abnormality is noted.    Electronically Signed   By: Inez Catalina M.D.   On: 10/26/2013 14:35   Dg Ankle Complete Right  10/26/2013   CLINICAL DATA:  Right ankle pain following a fall.  EXAM: RIGHT ANKLE - COMPLETE 3+ VIEW  COMPARISON:  None.  FINDINGS: Calcaneal spurs.  No fracture, dislocation or effusion seen.  IMPRESSION: No fracture.   Electronically Signed   By: Enrique Sack M.D.   On: 10/26/2013 14:39   US Renal  10/27/2013   CLINICAL DATA:  Renal failure. Diabetes. Chronic kidney disease, stage III. History of hypertension.  EXAM: RENAL/URINARY TRACT ULTRASOUND COMPLETE  COMPARISON:  CT of the abdomen and pelvis on 06/26/2012  FINDINGS: Right Kidney:  Length: 11.3 cm in length. Lower pole cystic area is 2.5 x 2.1 x 2.2 cm. This may contain a single internal septation. No solid component is identified. Increased echogenicity. No solid mass or hydronephrosis.  Left Kidney:  Length: 9.7 cm in length. Increased echogenicity. No solid mass or hydronephrosis.  Bladder:  Normal in appearance. Ureteral jets were not identified during exam.  IMPRESSION: 1. Right lower pole cyst with probable internal septation. No hydronephrosis. 2. Increased bilateral renal echogenicity, consistent with renal disease.   Electronically Signed   By: Shon Hale M.D.   On: 10/27/2013 11:12   US Carotid Duplex Bilateral  10/27/2013  CLINICAL DATA:  Syncope.  EXAM: BILATERAL CAROTID DUPLEX ULTRASOUND  TECHNIQUE: Pearline Cables scale imaging, color Doppler and duplex ultrasound were performed of bilateral carotid and vertebral arteries in the neck.  COMPARISON:  None.  FINDINGS: Criteria: Quantification of carotid stenosis is based on velocity parameters that correlate the residual internal carotid diameter with NASCET-based stenosis levels, using the diameter of the distal internal carotid lumen as the denominator for stenosis measurement.  The following velocity measurements were obtained:  RIGHT  ICA:  172 cm/sec  CCA:  79 cm/sec  SYSTOLIC ICA/CCA RATIO:  [id number]   DIASTOLIC ICA/CCA RATIO:  [id number]  ECA:  155 cm/sec  LEFT  ICA:  88 cm/sec  CCA:  123456 cm/sec  SYSTOLIC ICA/CCA RATIO:  0000000  DIASTOLIC ICA/CCA RATIO:  [id number]  ECA:  64 cm/sec  RIGHT CAROTID ARTERY: Mild intimal thickening along the distal common carotid artery with minor plaque in the carotid bulb. Heterogeneous noncalcified plaque is noted in the internal carotid artery just beyond its origin. Grayscale narrowing does not appear significant, but there is a velocity elevation is well as turbulent flow suggesting a more significant stenosis.  RIGHT VERTEBRAL ARTERY:  Normal antegrade flow documented.  LEFT CAROTID ARTERY: Mild generalized intimal thickening along the carina carotid artery and mild noncalcified plaque in the carotid bulb, but no hemodynamically significant stenosis.  LEFT VERTEBRAL ARTERY:  Normal antegrade flow documented.  IMPRESSION: 1. Based on the peak systolic flow velocity Q000111Q centimeters/second and the ICA CCA peak systolic velocity ratio of 2.16, there is a 50- 69% stenosis of the right internal carotid artery. 2. No other evidence of a significant stenosis.   Electronically Signed   By: Lajean Manes M.D.   On: 10/27/2013 09:58   Dg Foot Complete Right  10/26/2013   CLINICAL DATA:  Right foot pain following a fall.  EXAM: RIGHT FOOT COMPLETE - 3+ VIEW  COMPARISON:  None.  FINDINGS: Nondisplaced fracture in the base of the second proximal phalanx, medially. No intra-articular extension seen. Skin fold overlying the first proximal phalanx on the oblique view. No fracture at that location on the other views. Questionable linear lucency in the base of the fifth metatarsal. Calcaneal spurs.  IMPRESSION: 1. Nondisplaced fracture in the base of the second proximal phalanx without intra-articular extension. 2. Probable intertrabecular lucency in the base of the fifth metatarsal. A nondisplaced fracture is less likely.   Electronically Signed   By: Enrique Sack M.D.   On: 10/26/2013 14:38         Scheduled Meds: . aspirin EC  81 mg Oral Daily  . clopidogrel  75 mg Oral Q breakfast  . colchicine  0.6 mg Oral Daily  . enoxaparin (LOVENOX) injection  30 mg Subcutaneous Q24H  . gabapentin  400 mg Oral QHS  . insulin aspart  0-5 Units Subcutaneous QHS  . insulin aspart  0-9 Units Subcutaneous TID WC  . insulin NPH Human  90 Units Subcutaneous Q breakfast  . isosorbide dinitrate  20 mg Oral QHS  . levothyroxine  88 mcg Oral QAC breakfast  . sodium chloride  3 mL Intravenous Q12H   Continuous Infusions: . sodium chloride 135 mL/hr at 10/27/13 1236    Principal Problem:   Syncope Active Problems:   HYPERLIPIDEMIA   HYPERTENSION   ASTHMA   CAD (coronary artery disease)   Hx of CABG   Carotid artery disease   Anemia   Renal failure, acute on chronic   Fracture of toe of right  foot   Syncope and collapse    Time spent: 84 minutes    Jayel Scaduto, MD, FACP, FHM. [facility] Pager [phone]  If 7PM-7AM, please contact night-coverage [email or web address] Password [email or web address] 10/27/2013, 12:40 PM    LOS: 1 day

## 2013-10-27 NOTE — Progress Notes (Signed)
10/27/13 0942 Patient CBG 110 this am before breakfast, tolerated breakfast well. Order for NPH 100 units SQ with breakfast. Pt states "i usually take 110 units of NPH at home".  States "my blood sugar goes up and down", unable to state low and high ranges. Notified MD. Order received to decrease NPH dose to 90 units SQ this morning and monitor. Donavan Foil, RN

## 2013-10-27 NOTE — Care Management Note (Addendum)
    Page 1 of 1   10/29/2013     3:20:28 PM   CARE MANAGEMENT NOTE 10/29/2013  Patient:  Donna Howe, Donna Howe   Account Number:  [id number]  Date Initiated:  10/27/2013  Documentation initiated by:  Theophilus Kinds  Subjective/Objective Assessment:   Pt admitted from home with acute renal failure and syncope. Pt lives with her boyfriend and will return home at discharge. Pt has been fairly independent with ADl's. Pt stated that she has a walker that she has access to if she needs it at     Action/Plan:   discharge. No other CM needs noted.   Anticipated DC Date:  10/30/2013   Anticipated DC Plan:  [place]  CM consult      Choice offered to / List presented to:             Status of service:  Completed, signed off Medicare Important Message given?  YES (If response is "NO", the following Medicare IM given date fields will be blank) Date Medicare IM given:  10/29/2013 Date Additional Medicare IM given:    Discharge Disposition:  HOME/SELF CARE  Per UR Regulation:    If discussed at Long Length of Stay Meetings, dates discussed:    Comments:  10/29/13 [place], RN BSN CM Pt discharged home today, No Cm needs noted.  10/27/13 [place], RN BSN CM

## 2013-10-27 NOTE — Progress Notes (Signed)
Hypoglycemic Event  CBG: 59 at 1603  Treatment: 15 GM carbohydrate snack  Symptoms: None  Follow-up CBG: Time:1633 CBG Result: 59  Possible Reasons for Event: Unknown  Comments/MD notified: MD text-paged to notify.     Donna Howe  Remember to initiate Hypoglycemia Order Set & complete

## 2013-10-27 NOTE — Progress Notes (Signed)
Hypoglycemic Event  CBG: 59 at 1633  Treatment: 15 GM carbohydrate snack and patient eating supper tray.   Symptoms: None  Follow-up CBG: Time:1722 CBG Result: 83 (previous repeat CBG 41, but repeat to other hand 83). Patient not symptomatic for hypoglycemia.   Possible Reasons for Event: Unknown  Comments/MD notified:Text-paged Dr. Algis Liming to notify.      Verdell Face Joy  Remember to initiate Hypoglycemia Order Set & complete

## 2013-10-27 NOTE — Evaluation (Signed)
Physical Therapy Evaluation Patient Details Name: Donna Howe MRN: [id number] DOB: 15-Jul-1956 Today's Date: 10/27/2013 Time: [id number] PT Time Calculation (min): 38 min  PT Assessment / Plan / Recommendation History of Present Illness  Pt with a hx of cardiac disease as well as multiple medical illnesses is admitted after experiencing a syncopal episode at home and was found on the floor.  During the fall, she fractured her right 2nd toe.  Currently, she has no dizziness or other malaise but does c/o pain in the right toe.  She states "I can't walk".  Clinical Impression   Pt was seen for evaluation and gait training.  Her strength appears to be WNL, as is balance.  She has a surprising amount of pain in the RLE with any weight bearing on the right foot---even up into the right thigh and groin.  She has no pain or sx of hip abnormality.  She was instructed to try to bear weight on the right heel and minimize weight on her forefoot.  She was instructed in gait with a walker to help decrease her pain.  She is able to walk slowly with good stability using a walker.  She was instructed in elevation of the right foot.  She has a walker at home.  She declines any HHPT.  I will ask nursing service to ambulate her her in hospital.    PT Assessment  Patent does not need any further PT services    Follow Up Recommendations  No PT follow up    Does the patient have the potential to tolerate intense rehabilitation      Barriers to Discharge        Equipment Recommendations  None recommended by PT    Recommendations for Other Services     Frequency      Precautions / Restrictions Precautions Precautions: None Required Braces or Orthoses:  (post op shoe right for gait) Restrictions Weight Bearing Restrictions: Yes RLE Weight Bearing: Weight bearing as tolerated   Pertinent Vitals/Pain       Mobility  Bed Mobility Overal bed mobility: Modified Independent General bed mobility  comments: moves very gingerly so as not to bump the right toe Transfers Overall transfer level: Modified independent Equipment used: Rolling walker (2 wheeled) Ambulation/Gait Ambulation/Gait assistance: Supervision Ambulation Distance (Feet): 20 Feet Assistive device: Rolling walker (2 wheeled) Gait Pattern/deviations: Antalgic Gait velocity interpretation: Below normal speed for age/gender    Exercises     PT Diagnosis:    PT Problem List:   PT Treatment Interventions:       PT Goals(Current goals can be found in the care plan section) Acute Rehab PT Goals PT Goal Formulation: No goals set, d/c therapy  Visit Information  Last PT Received On: 10/27/13 History of Present Illness: Pt with a hx of cardiac disease as well as multiple medical illnesses is admitted after experiencing a syncopal episode at home and was found on the floor.  During the fall, she fractured her right 2nd toe.  Currently, she has no dizziness or other malaise but does c/o pain in the right toe.       Prior [place] expects to be discharged to:: Private residence Living Arrangements: Spouse/significant other Available Help at Discharge: Family;Available 24 hours/day Type of Home: House Entrance Stairs-Number of Steps: 2 Entrance Stairs-Rails: Right Home Layout: One level Home Equipment: Walker - 2 wheels Prior Function Level of Independence: Independent Communication Communication: No difficulties    Cognition  Cognition Arousal/Alertness: Awake/alert Behavior During Therapy: WFL for tasks assessed/performed Overall Cognitive Status: Within Functional Limits for tasks assessed    Extremity/Trunk Assessment Lower Extremity Assessment Lower Extremity Assessment: Overall WFL for tasks assessed   Balance Balance Overall balance assessment: No apparent balance deficits (not formally assessed)  End of Session PT - End of Session Equipment Utilized During Treatment:  Gait belt Activity Tolerance: Patient tolerated treatment well Patient left: in bed;with call bell/phone within reach  GP     Demetrios Isaacs L 10/27/2013, 11:56 AM

## 2013-10-28 ENCOUNTER — Encounter (HOSPITAL_COMMUNITY): Payer: Self-pay | Admitting: Internal Medicine

## 2013-10-28 DIAGNOSIS — D649 Anemia, unspecified: Secondary | ICD-10-CM

## 2013-10-28 DIAGNOSIS — N186 End stage renal disease: Secondary | ICD-10-CM | POA: Diagnosis present

## 2013-10-28 DIAGNOSIS — I059 Rheumatic mitral valve disease, unspecified: Secondary | ICD-10-CM

## 2013-10-28 DIAGNOSIS — E11649 Type 2 diabetes mellitus with hypoglycemia without coma: Secondary | ICD-10-CM | POA: Diagnosis present

## 2013-10-28 DIAGNOSIS — E1122 Type 2 diabetes mellitus with diabetic chronic kidney disease: Secondary | ICD-10-CM | POA: Diagnosis present

## 2013-10-28 DIAGNOSIS — E119 Type 2 diabetes mellitus without complications: Secondary | ICD-10-CM | POA: Diagnosis present

## 2013-10-28 DIAGNOSIS — D696 Thrombocytopenia, unspecified: Secondary | ICD-10-CM | POA: Diagnosis present

## 2013-10-28 LAB — BASIC METABOLIC PANEL
BUN: 50 mg/dL — AB (ref 6–23)
CO2: 31 mEq/L (ref 19–32)
CREATININE: 2.41 mg/dL — AB (ref 0.50–1.10)
Calcium: 8.7 mg/dL (ref 8.4–10.5)
Chloride: 101 mEq/L (ref 96–112)
GFR, EST AFRICAN AMERICAN: 25 mL/min — AB (ref >90)
GFR, EST NON AFRICAN AMERICAN: 21 mL/min — AB (ref >90)
GLUCOSE: 116 mg/dL — AB (ref 70–99)
Potassium: 4.2 mEq/L (ref 3.7–5.3)
Sodium: 141 mEq/L (ref 137–147)

## 2013-10-28 LAB — PHOSPHORUS: PHOSPHORUS: 3.6 mg/dL (ref 2.3–4.6)

## 2013-10-28 LAB — CBC
HCT: 28.6 % — ABNORMAL LOW (ref 36.0–46.0)
HEMOGLOBIN: 9.6 g/dL — AB (ref 12.0–15.0)
MCH: 30.6 pg (ref 26.0–34.0)
MCHC: 33.6 g/dL (ref 30.0–36.0)
MCV: 91.1 fL (ref 78.0–100.0)
Platelets: 121 10*3/uL — ABNORMAL LOW (ref 150–400)
RBC: 3.14 MIL/uL — ABNORMAL LOW (ref 3.87–5.11)
RDW: 13.5 % (ref 11.5–15.5)
WBC: 5.2 10*3/uL (ref 4.0–10.5)

## 2013-10-28 LAB — MPO/PR-3 (ANCA) ANTIBODIES
Myeloperoxidase Abs: <1
Serine Protease 3: <1

## 2013-10-28 LAB — VITAMIN D 25 HYDROXY (VIT D DEFICIENCY, FRACTURES): Vit D, 25-Hydroxy: 13 ng/mL — ABNORMAL LOW (ref 30–89)

## 2013-10-28 LAB — PTH, INTACT AND CALCIUM
CALCIUM TOTAL (PTH): 8.7 mg/dL (ref 8.4–10.5)
PTH: 427.6 pg/mL — ABNORMAL HIGH (ref 14.0–72.0)

## 2013-10-28 LAB — GLUCOSE, CAPILLARY
GLUCOSE-CAPILLARY: 184 mg/dL — AB (ref 70–99)
Glucose-Capillary: 41 mg/dL — CL (ref 70–99)
Glucose-Capillary: 109 mg/dL — ABNORMAL HIGH (ref 70–99)
Glucose-Capillary: 157 mg/dL — ABNORMAL HIGH (ref 70–99)
Glucose-Capillary: 137 mg/dL — ABNORMAL HIGH (ref 70–99)

## 2013-10-28 LAB — IRON AND TIBC
Iron: 61 ug/dL (ref 42–135)
Saturation Ratios: 29 % (ref 20–55)
TIBC: 214 ug/dL — ABNORMAL LOW (ref 250–470)
UIBC: 153 ug/dL (ref 125–400)

## 2013-10-28 LAB — VITAMIN B12: VITAMIN B 12: 319 pg/mL (ref 211–911)

## 2013-10-28 LAB — FERRITIN: Ferritin: 64 ng/mL (ref 10–291)

## 2013-10-28 LAB — C4 COMPLEMENT: COMPLEMENT C4, BODY FLUID: 27 mg/dL (ref 10–40)

## 2013-10-28 LAB — TSH: TSH: 11.657 u[IU]/mL — ABNORMAL HIGH (ref 0.350–4.500)

## 2013-10-28 LAB — ANA: Anti Nuclear Antibody(ANA): NEGATIVE

## 2013-10-28 LAB — C3 COMPLEMENT: C3 Complement: 118 mg/dL (ref 90–180)

## 2013-10-28 MED ORDER — INSULIN NPH (HUMAN) (ISOPHANE) 100 UNIT/ML ~~LOC~~ SUSP
15.0000 [IU] | Freq: Two times a day (BID) | SUBCUTANEOUS | Status: DC
  Administered 2013-10-28 – 2013-10-29 (×2): 15 [IU] via SUBCUTANEOUS
  Filled 2013-10-28: qty 10

## 2013-10-28 MED ORDER — ALUM & MAG HYDROXIDE-SIMETH 200-200-20 MG/5ML PO SUSP
15.0000 mL | ORAL | Status: DC | PRN
  Administered 2013-10-28: 15 mL via ORAL
  Filled 2013-10-28: qty 30

## 2013-10-28 MED ORDER — CALCITRIOL 0.25 MCG PO CAPS
0.5000 ug | ORAL_CAPSULE | Freq: Every day | ORAL | Status: DC
  Administered 2013-10-28 – 2013-10-29 (×2): 0.5 ug via ORAL
  Filled 2013-10-28 (×2): qty 2

## 2013-10-28 MED ORDER — SIMETHICONE 40 MG/0.6ML PO SUSP
20.0000 mg | Freq: Four times a day (QID) | ORAL | Status: DC | PRN
  Administered 2013-10-28: 20 mg via ORAL
  Filled 2013-10-28: qty 30

## 2013-10-28 MED ORDER — ENOXAPARIN SODIUM 60 MG/0.6ML ~~LOC~~ SOLN
50.0000 mg | SUBCUTANEOUS | Status: DC
  Administered 2013-10-28: 50 mg via SUBCUTANEOUS
  Filled 2013-10-28: qty 0.6

## 2013-10-28 NOTE — Progress Notes (Signed)
Subjective: Interval History: has no complaint of nausea or vomiting. Presently she denies any difficulty in breathing. She complains of acid reflux..  Objective: Vital signs in last 24 hours: Temp:  [97.5 F (36.4 C)-98.6 F (37 C)] 98.6 F (37 C) (02/10 1323) Pulse Rate:  [64-76] 75 (02/10 1323) Resp:  [18] 18 (02/10 1323) BP: (95-118)/(41-64) 118/41 mmHg (02/10 1323) SpO2:  [100 %] 100 % (02/10 1323) Weight change:   Intake/Output from previous day: 02/09 0701 - 02/10 0700 In: 1272 [P.O.:720; I.V.:552] Out: 2450 [Urine:2450] Intake/Output this shift: Total I/O In: 240 [P.O.:240] Out: 500 [Urine:500]  General appearance: alert, cooperative and no distress Resp: clear to auscultation bilaterally Cardio: regular rate and rhythm, S1, S2 normal, no murmur, click, rub or gallop GI: soft, non-tender; bowel sounds normal; no masses,  no organomegaly Extremities: extremities normal, atraumatic, no cyanosis or edema  Lab Results:  Recent Labs  10/27/13 0453 10/28/13 0453  WBC 5.8 5.2  HGB 10.3* 9.6*  HCT 30.2* 28.6*  PLT 132* 121*   BMET:  Recent Labs  10/27/13 0453 10/27/13 1103 10/28/13 0453  NA 141  --  141  K 3.3*  --  4.2  CL 96  --  101  CO2 31  --  31  GLUCOSE 122*  --  116*  BUN 72*  --  50*  CREATININE 2.85*  --  2.41*  CALCIUM 8.8 8.7 8.7    Recent Labs  10/27/13 1103  PTH 427.6*   Iron Studies:  Recent Labs  10/28/13 0453  IRON 61  TIBC 214*    Studies/Results: US Renal  10/27/2013   CLINICAL DATA:  Renal failure. Diabetes. Chronic kidney disease, stage III. History of hypertension.  EXAM: RENAL/URINARY TRACT ULTRASOUND COMPLETE  COMPARISON:  CT of the abdomen and pelvis on 06/26/2012  FINDINGS: Right Kidney:  Length: 11.3 cm in length. Lower pole cystic area is 2.5 x 2.1 x 2.2 cm. This may contain a single internal septation. No solid component is identified. Increased echogenicity. No solid mass or hydronephrosis.  Left Kidney:  Length: 9.7  cm in length. Increased echogenicity. No solid mass or hydronephrosis.  Bladder:  Normal in appearance. Ureteral jets were not identified during exam.  IMPRESSION: 1. Right lower pole cyst with probable internal septation. No hydronephrosis. 2. Increased bilateral renal echogenicity, consistent with renal disease.   Electronically Signed   By: Shon Hale M.D.   On: 10/27/2013 11:12   US Carotid Duplex Bilateral  10/27/2013   CLINICAL DATA:  Syncope.  EXAM: BILATERAL CAROTID DUPLEX ULTRASOUND  TECHNIQUE: Pearline Cables scale imaging, color Doppler and duplex ultrasound were performed of bilateral carotid and vertebral arteries in the neck.  COMPARISON:  None.  FINDINGS: Criteria: Quantification of carotid stenosis is based on velocity parameters that correlate the residual internal carotid diameter with NASCET-based stenosis levels, using the diameter of the distal internal carotid lumen as the denominator for stenosis measurement.  The following velocity measurements were obtained:  RIGHT  ICA:  172 cm/sec  CCA:  79 cm/sec  SYSTOLIC ICA/CCA RATIO:  [id number]  DIASTOLIC ICA/CCA RATIO:  [id number]  ECA:  155 cm/sec  LEFT  ICA:  88 cm/sec  CCA:  123456 cm/sec  SYSTOLIC ICA/CCA RATIO:  0000000  DIASTOLIC ICA/CCA RATIO:  [id number]  ECA:  64 cm/sec  RIGHT CAROTID ARTERY: Mild intimal thickening along the distal common carotid artery with minor plaque in the carotid bulb. Heterogeneous noncalcified plaque is noted in the internal carotid artery just beyond its  origin. Grayscale narrowing does not appear significant, but there is a velocity elevation is well as turbulent flow suggesting a more significant stenosis.  RIGHT VERTEBRAL ARTERY:  Normal antegrade flow documented.  LEFT CAROTID ARTERY: Mild generalized intimal thickening along the carina carotid artery and mild noncalcified plaque in the carotid bulb, but no hemodynamically significant stenosis.  LEFT VERTEBRAL ARTERY:  Normal antegrade flow documented.  IMPRESSION: 1. Based on the peak  systolic flow velocity Q000111Q centimeters/second and the ICA CCA peak systolic velocity ratio of 2.16, there is a 50- 69% stenosis of the right internal carotid artery. 2. No other evidence of a significant stenosis.   Electronically Signed   By: Lajean Manes M.D.   On: 10/27/2013 09:58    I have reviewed the patient's current medications.  Assessment/Plan: Problem #1 acute kidney injury: Her BUN is 50 and creatinine of 2.41 renal function seems to be improving. Presently seems to be acute on chronic. Etiology most likely prerenal versus ATN. Problem #2 hypokalemia she is on potassium supplement her potassium is 4.2 corrected Problem #3 hypertension her blood pressure seems to be reasonably controlled.  Problem #4 chronic kidney disease:. Her ultrasound showed bilaterally echogenic kidneys. Right kidney is greater than left kidney. Hence was history of hypertension renal artery stenosis the considered as a possibility. Problem #4 coronary artery disease Problem #5 diabetes Problem #6 anemia her hemoglobin and hematocrit is declining. Her iron saturation is normal and that is pending. Most likely this is secondary to chronic renal failure. Problem #7 metabolic bone disease: Her calcium and phosphorus range. Her PTH however is very high. This is most likely secondary hyperparathyroidism. Problem #8 hyperlipidemia Problem #9 carotid artery disease. Plan: We'll continue his hydration Will start patient on Rocaltrol 0.5 mcg by mouth once a day.   LOS: 2 days   Burdett Pinzon S 10/28/2013,3:08 PM

## 2013-10-28 NOTE — Progress Notes (Signed)
PROGRESS NOTE    Donna Howe [id number] DOB: 1955/12/27 DOA: 10/26/2013 PCP: Rory Percy, MD Outpatient Specialists:  1. Cardiology: Dr. Dola Argyle. 2. Vascular Surgeon: Dr. Tinnie Gens   HPI/Brief narrative 58 y.o. female with extensive PMH-CAD, DES stents to LM & [facility] 12/19/12, CABG, EF 60% by catheterization April 2014, hypertension, hyperlipidemia, obesity, asthma, gout, type II DM with renal complications, PAD, carotid artery disease, status post right carotid endarterectomy and patch angioplasty, stage III chronic kidney disease, presented to the ED following an episode of passing out at home. She gives 4-5 months history of intermittent dizziness which are brought on by upright and not in supine position.In the ED, glucose 202, creatinine 2.7, hemoglobin 11.7, troponin x1 negative, right foot x-ray shows nondisplaced fracture in the base of the second proximal phalanx. EKG with some new changes. Hospitalist admission requested.  Assessment/Plan:  1. Syncope: Likely from orthostatic hypotension. She was mildly orthostatic on 10/27/13. Cranial nerves are intact. There is no focal weakness. Troponin x4: Negative.  Carotid Doppler shows right 50-69% ICA stenosis (was 80-99% in July 2014). No arrhythmias on monitor. 2-D echocardiogram pending. Still slightly dizzy when sitting, but better. Continue hydration with IV fluids. Continue to hold antihypertensive medications.  3. Acute on stage III chronic kidney disease: Baseline creatinine (1.3-1.4). Likely secondary to prerenal from diuretics (Zaroxolyn and Lasix)/ATN from hypotension/ ACE inhibitors. These medications are currently on hold. Her creatinine has improved, but not close to baseline yet. Her urine output is nonoliguric. Renal ultrasound revealed medical renal disease. Nephrology is following and adjusting IV fluids. 4. Hypokalemia: Repleted and supplemented. Continue to monitor for day-to-day supplementation. 5. Closed  fracture of second toe of right foot: Continue Pain control and postop shoe. Outpatient orthopedic followup. 6. Uncontrolled type II DM with renal complications, CAD, PAD: Episode of asymptomatic hypoglycemia. Hypoglycemia at treated appropriately. NPH insulin has been progressively decreased. It was discontinued temporarily this morning, but will restart it at a much lower dose. We'll continue sliding scale NovoLog  sensitive scale. Her low blood sugars are likely a consequence of acute renal failure. We'll monitor and adjust insulin accordingly. Hemoglobin A1c 6.9. 7. History of hypertension: Blood pressures continue to be soft despite holding antihypertensives and hydrating with IVF. Continue to hold antihypertensives. 8. CAD status post CABG and stents: Cardiac cath in 2014 showed normal EF. No acute EKG changes and troponin x1 negative. Asymptomatic. Continue aspirin, Plavix. 9. Anemia: Possibly from chronic kidney disease. The decrease in her hemoglobin has been in part hemodilutional. Total iron and TIBC are pending. We'll order ferritin, vitamin B12, and TSH 10. History of carotid disease, status post right carotid endarterectomy: Carotid Doppler results as above. Outpatient followup with [facility]. Continue antiplatelet therapy. 11. History of gout, asthma, hyperlipidemia: Stable 12. Thrombocytopenia. Her platelet count was within normal limits on admission, but has trended downward. This could be dilutional in origin. However, will assess for vitamin B12 and TSH.    Code Status: Full Family Communication: None at bedside Disposition Plan: Home when medically stable   Consultants:  Nephrology  Procedures:  2-D echocardiogram pending.  Antibiotics:  None   Subjective: Still feels a little dizzy when she sits up in bed, but less so Denies dyspnea or chest pain. No current right foot or right second toe pain.  Objective: Filed Vitals:   10/27/13 0611 10/27/13 1450 10/27/13 1955  10/28/13 0456  BP: 96/62 120/73 95/59 99/64   Pulse: 71 60 64 76  Temp:  98.2 F (36.8  C) 97.5 F (36.4 C) 98.2 F (36.8 C)  TempSrc:  Oral Oral Oral  Resp:  18 18 18   Height:      Weight:      SpO2: 100% 100% 100% 100%    Intake/Output Summary (Last 24 hours) at 10/28/13 1310 Last data filed at 10/28/13 0826  Gross per 24 hour  Intake    723 ml  Output   2950 ml  Net  -2227 ml   Filed Weights   10/26/13 1722 10/27/13 0440  Weight: 109.8 kg (242 lb 1 oz) 111.403 kg (245 lb 9.6 oz)     Exam:  General exam: Pleasant comfortable female sitting up in bed. Respiratory system: Clear to auscultation bilaterally Cardiovascular system: S1 & S2 heard, RRR. No JVD, murmurs, gallops, clicks or pedal edema. Telemetry: Sinus rhythm. Gastrointestinal system: Abdomen is obese, nondistended, soft and nontender. Normal bowel sounds heard. Central nervous system: Alert and oriented. No focal neurological deficits. Extremities: Symmetric 5 x 5 power. Peripheral pulses symmetrically felt. Tiny abrasion with surrounding ecchymosis over right lower anterior shin. Tenderness to palpation of distal right forefoot and toes, especially right second toe.    Data Reviewed: Basic Metabolic Panel:  Recent Labs Lab 10/26/13 1354 10/27/13 0453 10/28/13 0453  NA 134* 141 141  K 3.7 3.3* 4.2  CL 89* 96 101  CO2 26 31 31   GLUCOSE 202* 122* 116*  BUN 72* 72* 50*  CREATININE 2.70* 2.85* 2.41*  CALCIUM 10.0 8.8 8.7  PHOS  --   --  3.6   Liver Function Tests: No results found for this basename: AST, ALT, ALKPHOS, BILITOT, PROT, ALBUMIN,  in the last 168 hours No results found for this basename: LIPASE, AMYLASE,  in the last 168 hours No results found for this basename: AMMONIA,  in the last 168 hours CBC:  Recent Labs Lab 10/26/13 1354 10/27/13 0453 10/28/13 0453  WBC 5.1 5.8 5.2  NEUTROABS 2.8  --   --   HGB 11.7* 10.3* 9.6*  HCT 33.7* 30.2* 28.6*  MCV 88.9 91.8 91.1  PLT 152 132*  121*   Cardiac Enzymes:  Recent Labs Lab 10/26/13 1354 10/26/13 1731 10/26/13 2336 10/27/13 0450 10/27/13 0453  CKTOTAL  --   --   --  80  --   TROPONINI <0.30 <0.30 <0.30  --  <0.30   BNP (last 3 results) No results found for this basename: PROBNP,  in the last 8760 hours CBG:  Recent Labs Lab 10/27/13 1721 10/27/13 1722 10/27/13 2000 10/28/13 0747 10/28/13 1130  GLUCAP 41* 83 111* 109* 184*    No results found for this or any previous visit (from the past 240 hour(s)).    Studies: Dg Tibia/fibula Right  10/26/2013   CLINICAL DATA:  Leg pain following injury  EXAM: RIGHT TIBIA AND FIBULA - 2 VIEW  COMPARISON:  None.  FINDINGS: There is no evidence of fracture or other focal bone lesions. Mild soft tissue swelling is noted anteriorly over the mid tibia. .  IMPRESSION: No acute bony abnormality is noted.   Electronically Signed   By: Inez Catalina M.D.   On: 10/26/2013 14:35   Dg Ankle Complete Right  10/26/2013   CLINICAL DATA:  Right ankle pain following a fall.  EXAM: RIGHT ANKLE - COMPLETE 3+ VIEW  COMPARISON:  None.  FINDINGS: Calcaneal spurs.  No fracture, dislocation or effusion seen.  IMPRESSION: No fracture.   Electronically Signed   By: Georg Ruddle.D.  On: 10/26/2013 14:39   US Renal  10/27/2013   CLINICAL DATA:  Renal failure. Diabetes. Chronic kidney disease, stage III. History of hypertension.  EXAM: RENAL/URINARY TRACT ULTRASOUND COMPLETE  COMPARISON:  CT of the abdomen and pelvis on 06/26/2012  FINDINGS: Right Kidney:  Length: 11.3 cm in length. Lower pole cystic area is 2.5 x 2.1 x 2.2 cm. This may contain a single internal septation. No solid component is identified. Increased echogenicity. No solid mass or hydronephrosis.  Left Kidney:  Length: 9.7 cm in length. Increased echogenicity. No solid mass or hydronephrosis.  Bladder:  Normal in appearance. Ureteral jets were not identified during exam.  IMPRESSION: 1. Right lower pole cyst with probable internal  septation. No hydronephrosis. 2. Increased bilateral renal echogenicity, consistent with renal disease.   Electronically Signed   By: Shon Hale M.D.   On: 10/27/2013 11:12   US Carotid Duplex Bilateral  10/27/2013   CLINICAL DATA:  Syncope.  EXAM: BILATERAL CAROTID DUPLEX ULTRASOUND  TECHNIQUE: Pearline Cables scale imaging, color Doppler and duplex ultrasound were performed of bilateral carotid and vertebral arteries in the neck.  COMPARISON:  None.  FINDINGS: Criteria: Quantification of carotid stenosis is based on velocity parameters that correlate the residual internal carotid diameter with NASCET-based stenosis levels, using the diameter of the distal internal carotid lumen as the denominator for stenosis measurement.  The following velocity measurements were obtained:  RIGHT  ICA:  172 cm/sec  CCA:  79 cm/sec  SYSTOLIC ICA/CCA RATIO:  [id number]  DIASTOLIC ICA/CCA RATIO:  [id number]  ECA:  155 cm/sec  LEFT  ICA:  88 cm/sec  CCA:  123456 cm/sec  SYSTOLIC ICA/CCA RATIO:  0000000  DIASTOLIC ICA/CCA RATIO:  [id number]  ECA:  64 cm/sec  RIGHT CAROTID ARTERY: Mild intimal thickening along the distal common carotid artery with minor plaque in the carotid bulb. Heterogeneous noncalcified plaque is noted in the internal carotid artery just beyond its origin. Grayscale narrowing does not appear significant, but there is a velocity elevation is well as turbulent flow suggesting a more significant stenosis.  RIGHT VERTEBRAL ARTERY:  Normal antegrade flow documented.  LEFT CAROTID ARTERY: Mild generalized intimal thickening along the carina carotid artery and mild noncalcified plaque in the carotid bulb, but no hemodynamically significant stenosis.  LEFT VERTEBRAL ARTERY:  Normal antegrade flow documented.  IMPRESSION: 1. Based on the peak systolic flow velocity [id number] centimeters/second and the ICA CCA peak systolic velocity ratio of 2.16, there is a 50- 69% stenosis of the right internal carotid artery. 2. No other evidence of a significant stenosis.    Electronically Signed   By: Lajean Manes M.D.   On: 10/27/2013 09:58   Dg Foot Complete Right  10/26/2013   CLINICAL DATA:  Right foot pain following a fall.  EXAM: RIGHT FOOT COMPLETE - 3+ VIEW  COMPARISON:  None.  FINDINGS: Nondisplaced fracture in the base of the second proximal phalanx, medially. No intra-articular extension seen. Skin fold overlying the first proximal phalanx on the oblique view. No fracture at that location on the other views. Questionable linear lucency in the base of the fifth metatarsal. Calcaneal spurs.  IMPRESSION: 1. Nondisplaced fracture in the base of the second proximal phalanx without intra-articular extension. 2. Probable intertrabecular lucency in the base of the fifth metatarsal. A nondisplaced fracture is less likely.   Electronically Signed   By: Enrique Sack M.D.   On: 10/26/2013 14:38        Scheduled Meds: . aspirin EC  81  mg Oral Daily  . clopidogrel  75 mg Oral Q breakfast  . [START ON 10/29/2013] colchicine  0.6 mg Oral Q48H  . enoxaparin (LOVENOX) injection  30 mg Subcutaneous Q24H  . gabapentin  400 mg Oral QHS  . insulin aspart  0-5 Units Subcutaneous QHS  . insulin aspart  0-9 Units Subcutaneous TID WC  . isosorbide dinitrate  20 mg Oral QHS  . levothyroxine  88 mcg Oral QAC breakfast  . sodium chloride  3 mL Intravenous Q12H   Continuous Infusions:    Principal Problem:   Syncope Active Problems:   Renal failure, acute on chronic   Diabetes mellitus   Hypoglycemia associated with diabetes   Thrombocytopenia, unspecified   HYPERLIPIDEMIA   HYPERTENSION   ASTHMA   CAD (coronary artery disease)   Hx of CABG   Carotid artery disease   Anemia   Fracture of toe of right foot   Syncope and collapse   Hypokalemia    Time spent: 55 minutes    Rexene Alberts, MD,  [facility] Pager [phone]  If 7PM-7AM, please contact night-coverage [email or web address] Password [email or web address] 10/28/2013, 1:10 PM    LOS: 2 days

## 2013-10-28 NOTE — Progress Notes (Signed)
*  PRELIMINARY RESULTS* Echocardiogram 2D Echocardiogram has been performed.  [place], [place] 10/28/2013, 10:23 AM

## 2013-10-28 NOTE — Progress Notes (Signed)
Inpatient Diabetes Program Recommendations  AACE/ADA: New Consensus Statement on Inpatient Glycemic Control (2013)  Target Ranges:  Prepandial:   less than 140 mg/dL      Peak postprandial:   less than 180 mg/dL (1-2 hours)      Critically ill patients:  140 - 180 mg/dL   Results for KIRRA, HEATER (MRN [id number]) as of 10/28/2013 07:10  Ref. Range 10/27/2013 07:28 10/27/2013 11:19 10/27/2013 16:03 10/27/2013 16:33 10/27/2013 17:21 10/27/2013 17:22 10/27/2013 20:00  Glucose-Capillary Latest Range: 70-99 mg/dL 110 (H) 125 (H) 59 (L) 59 (L) 41 (LL) 83 111 (H)   Results for Donna Howe, Donna Howe (MRN [id number]) as of 10/28/2013 07:10  Ref. Range 10/28/2013 04:53  Glucose Latest Range: 70-99 mg/dL 116 (H)   Diabetes history:  DM2 Outpatient Diabetes medications: NPH 110 units QAM Current orders for Inpatient glycemic control: NPH 60 units QAM (decreased yesterday evening), Novolog 0-9 AC, and Novolog 0-5 HS  Inpatient Diabetes Program Recommendations Insulin - Basal: Please consider splitting NPH into BID dosing; recommend NPH 30 units BID.  NOTE: Patient received NPH 90 units at 10:17 am and was noted to be hypoglycemia yesterday afternoon. NPH was decreased from 90 units to 60 units QAM. Called patient this morning and spoke with her over the phone.  Patient sees Dr. Loanne Drilling (endocrinologist) for diabetes management.  Noted in office notes by Dr. Loanne Drilling that patient was on Levemir in Aug 2014.  In talking with the patient, she reports that she was taking Levemir but she had to change to NPH due to the out of pocket cost of Levemir.  Therefore, she reports that she takes NPH via vial/syringe 110 units QAM.  Patient reports that she checks her blood sugar 1-2 times a day.  She checks when she gets up (ususally sleeps until 9:30 or 10:00) and her blood sugar runs 100-200's mg/dl.  She states that sometimes she checks her blood sugar in the evening before supper and it is usually runs around the same as it does in  the morning.  Patient reports that she can usually feel lows once her blood sugar gets to around 70 mg/dl.  However, she reports she did not feel like her blood sugar was low yesterday when her blood sugar was 59 mg/dl.  When asked about following diabetic diet, patient admits that she does not really follow any type of diet.  When asked about barriers to following a diabetic diet, patient reports that she does not really know what type of foods are best, she does not know how to count carbohydrates, and she is a very picky eater.  Informed patient that RD would be consulted to provide education on Carbohydrate Modified Diabetic diet.  Also asked that patient watch patient education videos on diabetes and nutrition.   Explained the difference in Levemir and NPH (with focus on duration and peak of NPH versus Levemir). Would recommend splitting NPH into BID dosing to prevent further hypoglycemic episodes.    Thanks, Barnie Alderman, RN, MSN, CCRN Diabetes Coordinator Inpatient Diabetes Program [phone] (Team Pager) [phone] ([place] office) [phone] [facility] office)

## 2013-10-28 NOTE — Plan of Care (Signed)
Problem: Food- and Nutrition-Related Knowledge Deficit (NB-1.1) Goal: Nutrition education Formal process to instruct or train a patient/client in a skill or to impart knowledge to help patients/clients voluntarily manage or modify food choices and eating behavior to maintain or improve health. Outcome: Completed/Met Date Met:  10/28/13  RD consulted for nutrition education regarding diabetes.     Lab Results  Component Value Date    HGBA1C 6.9* 07/30/2013    RD provided "Carbohydrate Counting for People with Diabetes" handout from the Academy of [employer] and Dietetics. Discussed different food groups and their effects on blood sugar, emphasizing carbohydrate-containing foods. Provided list of carbohydrates and recommended serving sizes of common foods.  Discussed importance of controlled and consistent carbohydrate intake throughout the day. Provided examples of ways to balance meals/snacks and encouraged intake of high-fiber, whole grain complex carbohydrates. Teach back method used.  Expect fair compliance.  Body mass index is 40.87 kg/(m^2). Pt meets criteria for extreme obesity, class III based on current BMI.  Current diet order is carb modified, patient is consuming approximately 100% of meals at this time. Labs and medications reviewed. No further nutrition interventions warranted at this time. RD contact information provided. If additional nutrition issues arise, please re-consult RD.  Andrew Soria A. Jimmye Norman, RD, LDN Pager: [phone]

## 2013-10-28 NOTE — Progress Notes (Signed)
Spoke with patient about diabetes and home regimen for diabetes control.  Patient reports that she sees Dr. Loanne Drilling for diabetes management.  According to the patient, she was on Levemir but was changed to NPH due to high co-pays (per patient over $400).  Therefore, as an outpatient, she reports that she takes NPH 110 units QAM and she reports that 4 or 5 vials cost her around $65 per month. Patient reports that her last A1C was in the 6% range but feels her blood sugars fluctuate from lows to highs. Discussed importance of checking CBGs and maintaining good CBG control to prevent long-term and short-term complications. Discussed impact of nutrition, stress, sickness, and exercise on diabetes control.  As noted earlier, patient admits that she does not follow a diabetic diet because she could never learn how to properly count carbohydrates.  RD has met with patient and provided written information on Carb Counting.  Reviewed basics of carb counting, provided additional handouts on carbohydrate counting, and informed patient about a free app called [employer]orie[employer] since she has a smartphone.  Also reiterated about watching diabetes education videos and patient reports that she will watch the videos this afternoon.  Patient verbalized understanding of information discussed and reports that she has no additional questions at this time. Will continue to follow.  Thanks, Barnie Alderman, RN, MSN, CCRN Diabetes Coordinator Inpatient Diabetes Program [phone] (Team Pager) [phone] ([place] office) [phone] [facility] office)

## 2013-10-29 DIAGNOSIS — N183 Chronic kidney disease, stage 3 unspecified: Secondary | ICD-10-CM

## 2013-10-29 DIAGNOSIS — E119 Type 2 diabetes mellitus without complications: Secondary | ICD-10-CM

## 2013-10-29 DIAGNOSIS — E860 Dehydration: Secondary | ICD-10-CM

## 2013-10-29 LAB — RENAL FUNCTION PANEL
Albumin: 2.6 g/dL — ABNORMAL LOW (ref 3.5–5.2)
BUN: 36 mg/dL — AB (ref 6–23)
CALCIUM: 8.6 mg/dL (ref 8.4–10.5)
CHLORIDE: 107 meq/L (ref 96–112)
CO2: 26 mEq/L (ref 19–32)
CREATININE: 1.96 mg/dL — AB (ref 0.50–1.10)
GFR calc Af Amer: 32 mL/min — ABNORMAL LOW (ref >90)
GFR calc non Af Amer: 28 mL/min — ABNORMAL LOW (ref >90)
GLUCOSE: 128 mg/dL — AB (ref 70–99)
Phosphorus: 2.9 mg/dL (ref 2.3–4.6)
Potassium: 3.9 mEq/L (ref 3.7–5.3)
Sodium: 142 mEq/L (ref 137–147)

## 2013-10-29 LAB — CBC
HEMATOCRIT: 27.2 % — AB (ref 36.0–46.0)
HEMOGLOBIN: 9.2 g/dL — AB (ref 12.0–15.0)
MCH: 30.9 pg (ref 26.0–34.0)
MCHC: 33.8 g/dL (ref 30.0–36.0)
MCV: 91.3 fL (ref 78.0–100.0)
PLATELETS: 110 10*3/uL — AB (ref 150–400)
RBC: 2.98 MIL/uL — AB (ref 3.87–5.11)
RDW: 13.6 % (ref 11.5–15.5)
WBC: 4.7 10*3/uL (ref 4.0–10.5)

## 2013-10-29 LAB — GLUCOSE, CAPILLARY
GLUCOSE-CAPILLARY: 155 mg/dL — AB (ref 70–99)
Glucose-Capillary: 133 mg/dL — ABNORMAL HIGH (ref 70–99)

## 2013-10-29 LAB — COMPLEMENT, TOTAL

## 2013-10-29 MED ORDER — VITAMIN D 1000 UNITS PO TABS
1000.0000 [IU] | ORAL_TABLET | Freq: Once | ORAL | Status: AC
  Administered 2013-10-29: 1000 [IU] via ORAL
  Filled 2013-10-29: qty 1

## 2013-10-29 MED ORDER — FUROSEMIDE 40 MG PO TABS: 40.0000 mg | ORAL_TABLET | Freq: Every day | ORAL | Status: DC

## 2013-10-29 MED ORDER — INSULIN NPH (HUMAN) (ISOPHANE) 100 UNIT/ML ~~LOC~~ SUSP: 20.0000 [IU] | Freq: Two times a day (BID) | SUBCUTANEOUS | Status: DC

## 2013-10-29 NOTE — Discharge Summary (Signed)
Physician Discharge Summary  Donna Howe [id number] DOB: 08-Jan-1956 DOA: 10/26/2013  PCP: Rory Percy, MD  Admit date: 10/26/2013 Discharge date: 10/29/2013  Time spent: 45 minutes  Recommendations for Outpatient Follow-up:  1. Followup with primary care physician in one week 2. Repeat basic metabolic panel when follows with primary care physician, current creatinine level is 1.9. 3. Outpatient nephrology followup in one week. 4. Follow up with endocrinology in one to 2 weeks since NPH dose has been changed.  Discharge Diagnoses:  Principal Problem:   Syncope Active Problems:   HYPERLIPIDEMIA   HYPERTENSION   ASTHMA   CAD (coronary artery disease)   Hx of CABG   Carotid artery disease   Anemia   Renal failure, acute on chronic   Fracture of toe of right foot   Syncope and collapse   Hypokalemia   Diabetes mellitus   Hypoglycemia associated with diabetes   Thrombocytopenia, unspecified   Discharge Condition: improved  Diet recommendation: low salt, low carb  Filed Weights   10/26/13 1722 10/27/13 0440 10/29/13 0500  Weight: 109.8 kg (242 lb 1 oz) 111.403 kg (245 lb 9.6 oz) 108.773 kg (239 lb 12.8 oz)    History of present illness:  Donna Howe is a 58 y.o. female with extensive PMH-CAD, DES stents to [facility] 12/19/12, CABG, EF 60% by catheterization April 2014, hypertension, hyperlipidemia, obesity, asthma, gout, type II DM with renal complications, PAD, carotid artery disease, status post right carotid endarterectomy and patch angioplasty, stage III chronic kidney disease, presented to the ED following an episode of passing out at home. She gives 4-5 months history of intermittent dizziness which are brought on by upright and not in supine position. She denies history of vertigo, tinnitus or deafness. She states that she may have the symptoms daily for a couple of days and then relief for a few days. Some of her symptoms have been attributed to polypharmacy and  medications are being titrated down. At approximately 11 AM on 2/8, she got up from her couch to turn down the heat thermostat. She had walked a few steps, reached for the thermostat, started feeling dizzy and then doesn't remember anything. She woke up on the floor and her boyfriend was calling out. She denied preceding chest pain, palpitations or dyspnea. She felt right shin swelling, pain and foot pain and when she tried to sit up, she continued to feel dizzy. She also had a sore head and neck and might have hit her head to the floor. No nasal or oral bleeding or drainage. She denies any strokelike symptoms. She was able to drag herself up and sit on the couch. She denies prior such episodes. Prior to this event, she was in her usual state of health without complaints. No history of fever, chills, nausea, vomiting, diarrhea, cough. She has normal urine output. In the ED, glucose 202, creatinine 2.7, hemoglobin 11.7, troponin x1 negative, right foot x-ray shows nondisplaced fracture in the base of the second proximal phalanx. EKG with some new changes. Hospitalist admission requested.   Hospital Course:  This patient was admitted to the hospital after episode of syncope. She was noted to be dehydrated in acute renal failure. The patient was also found to be on Lasix as well as metolazone as an outpatient. She was also on an ACE inhibitor. She was started on IV fluids and diuretics as well as ACE inhibitor were held. Blood pressure has since improved. Renal function is also normalizing and current  creatinine level is 1.9. She was followed by nephrology during her hospitalization and with her improving renal function has been cleared for discharge today. Patient is no longer orthostatic. She feels quite well and is anxious to discharge home. We will continue to hold her ACE inhibitor for now until seen by her primary care physician. We have discontinued metolazone and asked her to resume Lasix in 3 days. She  will need a basic metabolic panel checked when she follows up with her primary care physician in one week. Patient was also noted to have hypoglycemia on admission. She reported taking the 110 units of NPH every morning. During her hospital stay, she received 15 units of NPH twice a day and her blood sugars have remained under reasonable control. We'll increase this to 20 units twice a day and have her follow up with her endocrinologist in the next week. She has been advised to check her blood sugars closely.  Procedures: Echo: Study Conclusions  - Left ventricle: The cavity size was normal. Wall thickness was increased in a pattern of mild LVH. Systolic function was vigorous. The estimated ejection fraction was in the range of 65% to 70%. Wall motion was normal; there were no regional wall motion abnormalities. Features are consistent with a pseudonormal left ventricular filling pattern, with concomitant abnormal relaxation and increased filling pressure (grade 2 diastolic dysfunction). - Aortic valve: Mildly calcified annulus. Trileaflet. Cusp separation was normal. Transvalvular velocity was abnormal, due toincreased cardiac output and relatively small LVOT. No significant regurgitation. Mean gradient: 34mm Hg (S). - Mitral valve: Calcified annulus. Mild regurgitation. - Left atrium: The atrium was mildly dilated. - Right atrium: Central venous pressure: 77mm Hg (est). - Tricuspid valve: Physiologic regurgitation. - Pulmonary arteries: Systolic pressure could not be accurately estimated. - Pericardium, extracardiac: A prominent pericardial fat pad was present. There was no pericardial effusion. Impressions:  - Mild LVH with LVEF Q000111Q, grade 2 diastolic dysfunction with increased filling pressures. Mild left atrial enlargement. MAC with mild mitral regurgitation. Mildly sclerotic aortic valve, increased transvalvular velocity likely secondary to relatively small LVOT and  increased cardiac output, no definite aortic stenosis. Unable to assess PASP.    Consultations:  nephrology  Discharge Exam: Filed Vitals:   10/29/13 0959  BP: 108/55  Pulse: 73  Temp:   Resp: 20    General: NAD Cardiovascular: S1, S2 RRR Respiratory: CTA B  Discharge Instructions  Discharge Orders   Future Appointments Provider Department Dept Phone   11/04/2013 11:00 AM Renato Shin, MD [facility] Endocrinology [phone]   11/11/2013 12:30 PM [facility] [address] [phone]   11/11/2013 1:30 PM Mal Misty, MD [facility] [phone]   Future Orders Complete By Expires   Diet - low sodium heart healthy  As directed    Increase activity slowly  As directed        Medication List    STOP taking these medications       metolazone 5 MG tablet  Commonly known as:  ZAROXOLYN     quinapril 5 MG tablet  Commonly known as:  ACCUPRIL      TAKE these medications       aspirin EC 81 MG tablet  Take 81 mg by mouth daily.     clopidogrel 75 MG tablet  Commonly known as:  PLAVIX  Take 1 tablet (75 mg total) by mouth daily.     clotrimazole-betamethasone cream  Commonly known as:  LOTRISONE  Apply topically 3 (three) times daily. As needed for rash     colchicine 0.6 MG tablet  Take 0.6 mg by mouth daily.     furosemide 40 MG tablet  Commonly known as:  LASIX  Take 1 tablet (40 mg total) by mouth daily. Restart in 3 days     gabapentin 400 MG capsule  Commonly known as:  NEURONTIN  Take 400 mg by mouth at bedtime.     insulin NPH Human 100 UNIT/ML injection  Commonly known as:  HUMULIN N  Inject 20 Units into the skin 2 (two) times daily before a meal.     isosorbide dinitrate 20 MG tablet  Commonly known as:  ISORDIL  Take 20 mg by mouth at bedtime.     levothyroxine 88 MCG tablet  Commonly known as:  SYNTHROID, LEVOTHROID  Take 88 mcg by mouth daily.     nitroGLYCERIN  0.4 MG SL tablet  Commonly known as:  NITROSTAT  Place 0.4 mg under the tongue every 5 (five) minutes as needed for chest pain. Not to exceed 3 in 15 minute time frame     oxyCODONE-acetaminophen 10-325 MG per tablet  Commonly known as:  PERCOCET  Take 1 tablet by mouth every 6 (six) hours as needed for pain.       Allergies  Allergen Reactions  . Penicillins Other (See Comments)    REACTION: Unknown, told as a child       Follow-up Information   Follow up with Rory Percy, MD. (in one week and repeat kidney fuction and thyroid tests)    Specialty:  Family Medicine   Contact information:   [address] [address] [phone]        The results of significant diagnostics from this hospitalization (including imaging, microbiology, ancillary and laboratory) are listed below for reference.    Significant Diagnostic Studies: Dg Tibia/fibula Right  10/26/2013   CLINICAL DATA:  Leg pain following injury  EXAM: RIGHT TIBIA AND FIBULA - 2 VIEW  COMPARISON:  None.  FINDINGS: There is no evidence of fracture or other focal bone lesions. Mild soft tissue swelling is noted anteriorly over the mid tibia. .  IMPRESSION: No acute bony abnormality is noted.   Electronically Signed   By: Inez Catalina M.D.   On: 10/26/2013 14:35   Dg Ankle Complete Right  10/26/2013   CLINICAL DATA:  Right ankle pain following a fall.  EXAM: RIGHT ANKLE - COMPLETE 3+ VIEW  COMPARISON:  None.  FINDINGS: Calcaneal spurs.  No fracture, dislocation or effusion seen.  IMPRESSION: No fracture.   Electronically Signed   By: Enrique Sack M.D.   On: 10/26/2013 14:39   US Renal  10/27/2013   CLINICAL DATA:  Renal failure. Diabetes. Chronic kidney disease, stage III. History of hypertension.  EXAM: RENAL/URINARY TRACT ULTRASOUND COMPLETE  COMPARISON:  CT of the abdomen and pelvis on 06/26/2012  FINDINGS: Right Kidney:  Length: 11.3 cm in length. Lower pole cystic area is 2.5 x 2.1 x 2.2 cm. This may contain a single  internal septation. No solid component is identified. Increased echogenicity. No solid mass or hydronephrosis.  Left Kidney:  Length: 9.7 cm in length. Increased echogenicity. No solid mass or hydronephrosis.  Bladder:  Normal in appearance. Ureteral jets were not identified during exam.  IMPRESSION: 1. Right lower pole cyst with probable internal septation. No hydronephrosis. 2. Increased bilateral renal echogenicity, consistent with renal disease.   Electronically Signed  By: Shon Hale M.D.   On: 10/27/2013 11:12   US Carotid Duplex Bilateral  10/27/2013   CLINICAL DATA:  Syncope.  EXAM: BILATERAL CAROTID DUPLEX ULTRASOUND  TECHNIQUE: Pearline Cables scale imaging, color Doppler and duplex ultrasound were performed of bilateral carotid and vertebral arteries in the neck.  COMPARISON:  None.  FINDINGS: Criteria: Quantification of carotid stenosis is based on velocity parameters that correlate the residual internal carotid diameter with NASCET-based stenosis levels, using the diameter of the distal internal carotid lumen as the denominator for stenosis measurement.  The following velocity measurements were obtained:  RIGHT  ICA:  172 cm/sec  CCA:  79 cm/sec  SYSTOLIC ICA/CCA RATIO:  [id number]  DIASTOLIC ICA/CCA RATIO:  [id number]  ECA:  155 cm/sec  LEFT  ICA:  88 cm/sec  CCA:  123456 cm/sec  SYSTOLIC ICA/CCA RATIO:  0000000  DIASTOLIC ICA/CCA RATIO:  [id number]  ECA:  64 cm/sec  RIGHT CAROTID ARTERY: Mild intimal thickening along the distal common carotid artery with minor plaque in the carotid bulb. Heterogeneous noncalcified plaque is noted in the internal carotid artery just beyond its origin. Grayscale narrowing does not appear significant, but there is a velocity elevation is well as turbulent flow suggesting a more significant stenosis.  RIGHT VERTEBRAL ARTERY:  Normal antegrade flow documented.  LEFT CAROTID ARTERY: Mild generalized intimal thickening along the carina carotid artery and mild noncalcified plaque in the carotid bulb, but  no hemodynamically significant stenosis.  LEFT VERTEBRAL ARTERY:  Normal antegrade flow documented.  IMPRESSION: 1. Based on the peak systolic flow velocity Q000111Q centimeters/second and the ICA CCA peak systolic velocity ratio of 2.16, there is a 50- 69% stenosis of the right internal carotid artery. 2. No other evidence of a significant stenosis.   Electronically Signed   By: Lajean Manes M.D.   On: 10/27/2013 09:58   Dg Foot Complete Right  10/26/2013   CLINICAL DATA:  Right foot pain following a fall.  EXAM: RIGHT FOOT COMPLETE - 3+ VIEW  COMPARISON:  None.  FINDINGS: Nondisplaced fracture in the base of the second proximal phalanx, medially. No intra-articular extension seen. Skin fold overlying the first proximal phalanx on the oblique view. No fracture at that location on the other views. Questionable linear lucency in the base of the fifth metatarsal. Calcaneal spurs.  IMPRESSION: 1. Nondisplaced fracture in the base of the second proximal phalanx without intra-articular extension. 2. Probable intertrabecular lucency in the base of the fifth metatarsal. A nondisplaced fracture is less likely.   Electronically Signed   By: Enrique Sack M.D.   On: 10/26/2013 14:38    Microbiology: No results found for this or any previous visit (from the past 240 hour(s)).   Labs: Basic Metabolic Panel:  Recent Labs Lab 10/26/13 1354 10/27/13 0453 10/27/13 1103 10/28/13 0453 10/29/13 0458  NA 134* 141  --  141 142  K 3.7 3.3*  --  4.2 3.9  CL 89* 96  --  101 107  CO2 26 31  --  31 26  GLUCOSE 202* 122*  --  116* 128*  BUN 72* 72*  --  50* 36*  CREATININE 2.70* 2.85*  --  2.41* 1.96*  CALCIUM 10.0 8.8 8.7 8.7 8.6  PHOS  --   --   --  3.6 2.9   Liver Function Tests:  Recent Labs Lab 10/29/13 0458  ALBUMIN 2.6*   No results found for this basename: LIPASE, AMYLASE,  in the last 168 hours No results found  for this basename: AMMONIA,  in the last 168 hours CBC:  Recent Labs Lab 10/26/13 1354  10/27/13 0453 10/28/13 0453 10/29/13 0458  WBC 5.1 5.8 5.2 4.7  NEUTROABS 2.8  --   --   --   HGB 11.7* 10.3* 9.6* 9.2*  HCT 33.7* 30.2* 28.6* 27.2*  MCV 88.9 91.8 91.1 91.3  PLT 152 132* 121* 110*   Cardiac Enzymes:  Recent Labs Lab 10/26/13 1354 10/26/13 1731 10/26/13 2336 10/27/13 0450 10/27/13 0453  CKTOTAL  --   --   --  80  --   TROPONINI <0.30 <0.30 <0.30  --  <0.30   BNP: BNP (last 3 results) No results found for this basename: PROBNP,  in the last 8760 hours CBG:  Recent Labs Lab 10/28/13 1130 10/28/13 1634 10/28/13 2207 10/29/13 0725 10/29/13 1220  GLUCAP 184* 157* 137* 133* 155*       Signed:  MEMON,JEHANZEB  [facility] 10/29/2013, 7:38 PM

## 2013-10-29 NOTE — Discharge Instructions (Signed)
Kidney Failure °Kidney failure happens when the kidneys cannot remove waste and excess fluid that naturally builds up in your blood after your body breaks down food. This leads to a dangerous buildup of waste products and fluid in the blood. °HOME CARE °· Follow your diet as told by your doctor. °· Take all medicines as told by your doctor. °· Keep all of your dialysis appointments. Call if you are unable to keep an appointment. °GET HELP RIGHT AWAY IF:  °· You make a lot more or very little pee (urine). °· Your face or ankles puff up (swell). °· You develop shortness of breath. °· You develop weakness, feel tired, or you do not feel hungry (appetite loss). °· You feel poorly for no known reason. °MAKE SURE YOU:  °· Understand these instructions. °· Will watch your condition. °· Will get help right away if you are not doing well or get worse. °Document Released: 11/29/2009 Document Revised: 11/27/2011 Document Reviewed: 01/05/2010 °Exit[employer]® Patient Information ©2014 [employer]. ° °

## 2013-10-29 NOTE — Progress Notes (Signed)
Donna Howe  MRN: [id number]  DOB/AGE: 1955-10-25 58 y.o.  Primary Care Physician:HOWARD, Lennette Bihari, MD  Admit date: 10/26/2013  Chief Complaint:  Chief Complaint  Patient presents with  . Loss of Consciousness    S-Pt presented on  10/26/2013 with  Chief Complaint  Patient presents with  . Loss of Consciousness  .    Pt today feels better.  Meds . aspirin EC  81 mg Oral Daily  . calcitRIOL  0.5 mcg Oral Daily  . clopidogrel  75 mg Oral Q breakfast  . colchicine  0.6 mg Oral Q48H  . enoxaparin (LOVENOX) injection  50 mg Subcutaneous Q24H  . gabapentin  400 mg Oral QHS  . insulin aspart  0-5 Units Subcutaneous QHS  . insulin aspart  0-9 Units Subcutaneous TID WC  . insulin NPH Human  15 Units Subcutaneous BID WC  . isosorbide dinitrate  20 mg Oral QHS  . levothyroxine  88 mcg Oral QAC breakfast  . sodium chloride  3 mL Intravenous Q12H      Physical Exam: Vital signs in last 24 hours: Temp:  [97.7 F (36.5 C)-98.6 F (37 C)] 97.7 F (36.5 C) (02/11 0516) Pulse Rate:  [69-85] 85 (02/11 0516) Resp:  [18-20] 20 (02/11 0516) BP: (118-162)/(41-72) 158/72 mmHg (02/11 0516) SpO2:  [95 %-100 %] 95 % (02/11 0516) Weight:  [239 lb 12.8 oz (108.773 kg)] 239 lb 12.8 oz (108.773 kg) (02/11 0500) Weight change:  Last BM Date: 10/26/13  Intake/Output from previous day: 02/10 0701 - 02/11 0700 In: 720 [P.O.:720] Out: 1650 [Urine:1650]     Physical Exam: General- pt is awake,alert, oriented to time place and person Resp- No acute REsp distress, CTA B/L NO Rhonchi CVS- S1S2 regular in rate and rhythm GIT- BS+, soft, NT, ND EXT- NO LE Edema, Cyanosis          Right 2nd toe edematous, tender   Lab Results: CBC  Recent Labs  10/28/13 0453 10/29/13 0458  WBC 5.2 4.7  HGB 9.6* 9.2*  HCT 28.6* 27.2*  PLT 121* 110*    BMET  Recent Labs  10/28/13 0453 10/29/13 0458  NA 141 142  K 4.2 3.9  CL 101 107  CO2 31 26  GLUCOSE 116* 128*  BUN 50* 36*  CREATININE  2.41* 1.96*  CALCIUM 8.7 8.6   Trend Creat 2015  2.7==>2.85==>2.4==>1.96 2014  1.3--1.5 2013  1.13 2011   1.3--1.6 2009   1.99   Lab Results  Component Value Date   PTH 427.6* 10/27/2013   CALCIUM 8.6 10/29/2013   PHOS 2.9 10/29/2013  Vitamin D 13    Impression: 1)Renal  AKI secondary to Prerenal/ATN               AKI on CKD               AKI now improving               Creat is trending down               CKD stage 3 .               CKD since 2009               CKD secondary to DM/ HTN /Ischemic nephropathy                Progression of CKD now marked with AKI  Proteinura Absent .              2)Hypotension-admitted with Hypotension Hx of HTN  Hx of Target Organ damage  CKD CAD S/P CABG S/p CEA PVD  BP stable    3)Anemia HGb at goal (9--11) An of Chronic Disease  4)CKD Mineral-Bone Disorder PTH elevated. Secondary Hyperparathyroidism  Present.    ON Calcitriol Phosphorus at goal. Vitamin 25-OH low.  5)CNS-admitted with Syncope Secondary  to Hypovolemia Primary MD following  6)Electrolytes Normokalemic    Was Hypokalemic NOrmonatremic   7)Acid base Co2 at goal     Plan:  Will start Vitamin D replacement  If d/c home -will benefit from outpt nephrology follow up     [place] 10/29/2013, 9:06 AM

## 2013-10-29 NOTE — Progress Notes (Signed)
Pt is to be discharged home today. Pt is in NAD, IV is out, all paperwork has been reviewed/discussed with patient, and there are no questions/concerns at this time. Assessment is unchanged from this morning. Pt is to be accompanied downstairs by staff and family via wheelchair.  

## 2013-10-30 ENCOUNTER — Telehealth: Payer: Self-pay | Admitting: *Deleted

## 2013-10-30 NOTE — Telephone Encounter (Signed)
Mrs. Donna Howe states she recently had a carotid duplex on an recent admission to [facility].  Mrs. Donna Howe states she has an upcoming appt. with Dr. Kellie Howe and has a carotid duplex scheduled prior to this appointment on 11-11-2013.  Mrs. Donna Howe is questioning whether the bilateral carotid duplex needs to be repeated.  A bilateral carotid duplex was done on 10-26-2013 at [facility] with full report attached.  Recommended to Mrs. Donna Howe that I would discuss this with Dr. Kellie Howe on Monday 11-03-2013 to see if he wants to see her on 11-11-2013 and if he wants the carotid duplex repeated or if he wants to defer seeing her for 6 months.  Will call Mrs. Donna Howe back on Monday with Dr. Evelena Howe recommendation.  Mrs. Donna Howe verbalized understanding and is in aggrement with this plan.

## 2013-11-03 ENCOUNTER — Telehealth: Payer: Self-pay | Admitting: *Deleted

## 2013-11-03 NOTE — Telephone Encounter (Signed)
Donna Howe states she had recent carotid duplex done on 10-26-2013 during recent hospitalization at [facility].  Donna Howe states she is scheduled on 11-11-2013 for a carotid duplex at [facility] and a 6 month follow up appt. with Dr. Kellie Simmering.  Donna Howe is inquiring about the need for repeating the carotid duplex.  Discussed this situation with Dr. Kellie Simmering.  Dr. Kellie Simmering states there is no need to repeat the carotid duplex on 11-11-2013 but that he wants Donna Howe to keep her 6 month FU to see him on 11-11-2013.    Notified Donna Howe by telephone to keep her 11-11-2013 follow up appt. with Dr. Kellie Simmering and that carotid duplex has been cancelled.

## 2013-11-04 ENCOUNTER — Ambulatory Visit: Payer: Medicare Other | Admitting: Endocrinology

## 2013-11-07 ENCOUNTER — Ambulatory Visit: Payer: Medicare Other | Admitting: Endocrinology

## 2013-11-10 ENCOUNTER — Encounter: Payer: Self-pay | Admitting: Vascular Surgery

## 2013-11-11 ENCOUNTER — Other Ambulatory Visit (HOSPITAL_COMMUNITY): Payer: Medicare Other

## 2013-11-11 ENCOUNTER — Ambulatory Visit: Payer: Medicare Other | Admitting: Endocrinology

## 2013-11-11 ENCOUNTER — Ambulatory Visit: Payer: Medicare Other | Admitting: Vascular Surgery

## 2013-11-14 ENCOUNTER — Encounter: Payer: Self-pay | Admitting: Endocrinology

## 2013-11-14 ENCOUNTER — Ambulatory Visit (INDEPENDENT_AMBULATORY_CARE_PROVIDER_SITE_OTHER): Payer: Medicare Other | Admitting: Endocrinology

## 2013-11-14 VITALS — BP 122/74 | HR 74 | Temp 97.9°F | Ht 65.0 in | Wt 237.0 lb

## 2013-11-14 DIAGNOSIS — E1129 Type 2 diabetes mellitus with other diabetic kidney complication: Secondary | ICD-10-CM

## 2013-11-14 NOTE — Progress Notes (Signed)
Subjective:    Patient ID: Donna Howe, female    DOB: 08-09-1956, 58 y.o.   MRN: [id number]  HPI Pt returns for f/u of insulin-requiring DM (dx'ed 2002, on a routine blood test; she has mild if any neuropathy of the lower extremities, but she has associated renal failure, PAD, and CAD; she was changed to a simpler qd insulin schedule, due to noncompliance--since then, compliance has been better; last episode of severe hypoglycemia was approx 2011; she has never had DKA).  Pt was hospitalized a few weeks ago with syncope, and was found to have exacerbation of her renal failure and hypoglycemia.  After hospital d/c, she has seen in f/u by dr Nadara Mustard.  She was d/c'ed on NPH insulin, 20 units bid.  Dr Nadara Mustard rx'ed prednisone for left elbow pain.  Since then, cbg's were 400, so she increased insulin to 30 units bid.  Since then, cbg's vary from 120-180.  It is in general higher as the day goes on.  She finished prednisone 6 days ago.   Past Medical History  Diagnosis Date  . Hypercholesteremia   . Hypertension   . HYPERLIPIDEMIA 03/31/2007  . Overweight 08/26/2009  . HYPERTENSION 03/31/2007  . CAD (coronary artery disease) 03/31/2007  . ASTHMA 08/26/2009  . Gout   . SBO (small bowel obstruction) 2011    lap lysis of adhesions & hernia repair  . Hx of CABG     2005  . Ejection fraction     Normal EF echo 2010  //   EF 60%, catheterization, April, 2014  . Anginal pain   . Shortness of breath   . Diabetes mellitus   . DIABETES MELLITUS, TYPE II 03/31/2007  . Heart murmur   . PAD (peripheral artery disease)     Dr. Kellie Simmering  . Carotid artery occlusion   . RENAL FAILURE 03/31/2007    NOT ON HD ( PCP ONLY DR Rory Percy)  . Chronic kidney disease (CKD), stage III (moderate)   . Arthritis     GOUT  . Carotid artery disease   . Umbilical hernia   . Thrombocytopenia, unspecified 10/28/2013    Past Surgical History  Procedure Laterality Date  . Cholecystectomy  2010  . Hernia repair  1989      open w mesh  . Cesarean section  1984  . Cardiac catheterization  07/18/2004  . Cardiac stents  2006-2007  . Incisional hernia repair x2  03/04/2010     laparoscopic with 35cm mesh by Dr Ronnald Collum  . Shoulder surgery    . Coronary artery bypass graft  2005  . Coronary angioplasty with stent placement  12/19/2012    [facility]    DES  . Endarterectomy Right 04/18/2013    Procedure: ENDARTERECTOMY CAROTID;  Surgeon: Mal Misty, MD;  Location: [place];  Service: Vascular;  Laterality: Right;  . Patch angioplasty Right 04/18/2013    Procedure: PATCH ANGIOPLASTY Right Internal Carotid Artery;  Surgeon: Mal Misty, MD;  Location: [place];  Service: Vascular;  Laterality: Right;    History   Social History  . Marital Status: Divorced    Spouse Name: N/A    Number of Children: N/A  . Years of Education: N/A   Occupational History  . Disabled    Social History Main Topics  . Smoking status: Former Smoker -- 1.00 packs/day for 20 years    Types: Cigarettes    Quit date: 12/10/2012  . Smokeless tobacco: Never  Used  . Alcohol Use: No  . Drug Use: No  . Sexual Activity: Not on file   Other Topics Concern  . Not on file   Social History Narrative   Takes care of a boyfriend whom ia also disabled    Current Outpatient Prescriptions on File Prior to Visit  Medication Sig Dispense Refill  . aspirin EC 81 MG tablet Take 81 mg by mouth daily.      . clopidogrel (PLAVIX) 75 MG tablet Take 1 tablet (75 mg total) by mouth daily.  30 tablet  11  . clotrimazole-betamethasone (LOTRISONE) cream Apply topically 3 (three) times daily. As needed for rash  45 g  2  . colchicine 0.6 MG tablet Take 0.6 mg by mouth daily.       . furosemide (LASIX) 40 MG tablet Take 1 tablet (40 mg total) by mouth daily. Restart in 3 days  30 tablet    . gabapentin (NEURONTIN) 400 MG capsule Take 400 mg by mouth at bedtime.      . isosorbide dinitrate (ISORDIL) 20 MG tablet Take 20 mg by mouth at bedtime.        . nitroGLYCERIN (NITROSTAT) 0.4 MG SL tablet Place 0.4 mg under the tongue every 5 (five) minutes as needed for chest pain. Not to exceed 3 in 15 minute time frame      . oxyCODONE-acetaminophen (PERCOCET) 10-325 MG per tablet Take 1 tablet by mouth every 6 (six) hours as needed for pain.  30 tablet  0   No current facility-administered medications on file prior to visit.    Allergies  Allergen Reactions  . Penicillins Other (See Comments)    REACTION: Unknown, told as a child    Family History  Problem Relation Age of Onset  . Diabetes Mother   . Heart disease Mother   . Hyperlipidemia Mother   . Hypertension Mother   . Thyroid disease Father   . Hypertension Father   . AAA (abdominal aortic aneurysm) Father   . Heart disease Brother   . Hyperlipidemia Son   . Hypertension Son     BP 122/74  Pulse 74  Temp(Src) 97.9 F (36.6 C) (Oral)  Ht 5\' 5"  (1.651 m)  Wt 237 lb (107.502 kg)  BMI 39.44 kg/m2  SpO2 95%  Review of Systems Denies weight change and n/v.     Objective:   Physical Exam VITAL SIGNS:  See vs page GENERAL: no distress SKIN:  Insulin injection sites at the anterior abdomen are normal.      Assessment & Plan:  ARF, uncertain etiology, improved. DM: episode of hypoglycemia was due to ARF.  With time, her insulin requirement will return to what it was prior to the recent illness.

## 2013-11-14 NOTE — Patient Instructions (Addendum)
On this type of insulin schedule, you should eat meals on a regular schedule.  If a meal is missed or significantly delayed, your blood sugar could go low.  check your blood sugar 2 times a day.  vary the time of day when you check, between before the 3 meals, and at bedtime.  also check if you have symptoms of your blood sugar being too high or too low.  please keep a record of the readings and bring it to your next appointment here.  please call us sooner if you are having low blood sugar episodes.   Please make a follow-up appointment in 2 months.    Please change the insulin to 60 units each morning.  With time, your insulin requirement will probably increase back to what it was prior to your hospital visit.  Please call if your blood sugar is over 200.

## 2013-12-01 ENCOUNTER — Encounter: Payer: Self-pay | Admitting: Vascular Surgery

## 2013-12-02 ENCOUNTER — Encounter: Payer: Self-pay | Admitting: Vascular Surgery

## 2013-12-02 ENCOUNTER — Ambulatory Visit (INDEPENDENT_AMBULATORY_CARE_PROVIDER_SITE_OTHER): Payer: Medicare Other | Admitting: Vascular Surgery

## 2013-12-02 VITALS — BP 153/87 | HR 72 | Ht 65.0 in | Wt 235.0 lb

## 2013-12-02 DIAGNOSIS — I6529 Occlusion and stenosis of unspecified carotid artery: Secondary | ICD-10-CM

## 2013-12-02 DIAGNOSIS — Z48812 Encounter for surgical aftercare following surgery on the circulatory system: Secondary | ICD-10-CM

## 2013-12-02 NOTE — Progress Notes (Signed)
Subjective:     Patient ID: Donna Howe, female   DOB: 1956/05/28, 58 y.o.   MRN: [id number]  HPI this 58 year old female returns for initial followup regarding a right carotid endarterectomy performed in August of 2014. She had a severe asymptomatic stenosis. She has done well except in February of 2015 she had a near syncopal episode. She was evaluated at [facility] and carotid ultrasound was performed which were revealed approximately 50% right ICA stenosis and no stenosis in the left ICA. She denies any lateralizing weakness, aphasia, amaurosis fugax, diplopia, or blurred vision. She has had no further episodes of syncope.  Past Medical History  Diagnosis Date  . Hypercholesteremia   . Hypertension   . HYPERLIPIDEMIA 03/31/2007  . Overweight 08/26/2009  . HYPERTENSION 03/31/2007  . CAD (coronary artery disease) 03/31/2007  . ASTHMA 08/26/2009  . Gout   . SBO (small bowel obstruction) 2011    lap lysis of adhesions & hernia repair  . Hx of CABG     2005  . Ejection fraction     Normal EF echo 2010  //   EF 60%, catheterization, April, 2014  . Anginal pain   . Shortness of breath   . Diabetes mellitus   . DIABETES MELLITUS, TYPE II 03/31/2007  . Heart murmur   . PAD (peripheral artery disease)     Dr. Kellie Simmering  . Carotid artery occlusion   . RENAL FAILURE 03/31/2007    NOT ON HD ( PCP ONLY DR Rory Percy)  . Chronic kidney disease (CKD), stage III (moderate)   . Arthritis     GOUT  . Carotid artery disease   . Umbilical hernia   . Thrombocytopenia, unspecified 10/28/2013    History  Substance Use Topics  . Smoking status: Former Smoker -- 1.00 packs/day for 20 years    Types: Cigarettes    Quit date: 12/10/2012  . Smokeless tobacco: Never Used  . Alcohol Use: No    Family History  Problem Relation Age of Onset  . Diabetes Mother   . Heart disease Mother   . Hyperlipidemia Mother   . Hypertension Mother   . Thyroid disease Father   . Hypertension Father    . AAA (abdominal aortic aneurysm) Father   . Heart disease Brother   . Hyperlipidemia Son   . Hypertension Son     Allergies  Allergen Reactions  . Penicillins Other (See Comments)    REACTION: Unknown, told as a child    Current outpatient prescriptions:aspirin EC 81 MG tablet, Take 81 mg by mouth daily., Disp: , Rfl: ;  clopidogrel (PLAVIX) 75 MG tablet, Take 1 tablet (75 mg total) by mouth daily., Disp: 30 tablet, Rfl: 11;  clotrimazole-betamethasone (LOTRISONE) cream, Apply topically 3 (three) times daily. As needed for rash, Disp: 45 g, Rfl: 2;  colchicine 0.6 MG tablet, Take 0.6 mg by mouth daily. , Disp: , Rfl:  furosemide (LASIX) 40 MG tablet, Take 1 tablet (40 mg total) by mouth daily. Restart in 3 days, Disp: 30 tablet, Rfl: ;  gabapentin (NEURONTIN) 400 MG capsule, Take 400 mg by mouth at bedtime., Disp: , Rfl: ;  insulin NPH Human (HUMULIN N,NOVOLIN N) 100 UNIT/ML injection, Inject 60 Units into the skin every morning. , Disp: , Rfl: ;  isosorbide dinitrate (ISORDIL) 20 MG tablet, Take 20 mg by mouth at bedtime. , Disp: , Rfl:  levothyroxine (SYNTHROID, LEVOTHROID) 100 MCG tablet, Take 100 mcg by mouth daily before breakfast., Disp: ,  Rfl: ;  nitroGLYCERIN (NITROSTAT) 0.4 MG SL tablet, Place 0.4 mg under the tongue every 5 (five) minutes as needed for chest pain. Not to exceed 3 in 15 minute time frame, Disp: , Rfl: ;  oxyCODONE-acetaminophen (PERCOCET) 10-325 MG per tablet, Take 1 tablet by mouth every 6 (six) hours as needed for pain., Disp: 30 tablet, Rfl: 0  BP 153/87  Pulse 72  Ht 5\' 5"  (1.651 m)  Wt 235 lb (106.595 kg)  BMI 39.11 kg/m2  SpO2 100%  Body mass index is 39.11 kg/(m^2).           Review of Systems denies chest pain but does have mild dyspnea on exertion. States she has developed hypothyroidism with edema and is now on thyroid replacement. Has occasional dizziness. All other systems negative and complete review of system     Objective:   Physical  Exam BP 153/87  Pulse 72  Ht 5\' 5"  (1.651 m)  Wt 235 lb (106.595 kg)  BMI 39.11 kg/m2  SpO2 100%  Gen.-alert and oriented x3 in no apparent distress HEENT normal for age Lungs no rhonchi or wheezing Cardiovascular regular rhythm no murmurs carotid pulses 3+ palpable no bruits audible Abdomen soft nontender no palpable masses-obese Musculoskeletal free of  major deformities Skin clear -no rashes Neurologic normal Lower extremities 3+ femoral and dorsalis pedis pulses palpable bilaterally with no edema  A review of the carotid duplex exam performed last month which revealed some mild narrowing in the endarterectomy site on the right and no stenosis in the left ICA.       Assessment:     6 months post right carotid endarterectomy with possible mild restenosis right ICA and syncopal episode 4 weeks ago-unexplained but not recurrent    Plan:     #1 continue daily aspirin #2 notify us of any neurologic symptoms occur #3 return in 6 months with repeat carotid duplex exam in our office

## 2013-12-03 NOTE — Addendum Note (Signed)
Addended by: Mena Goes on: 12/03/2013 10:52 AM   Modules accepted: Orders

## 2014-01-12 ENCOUNTER — Ambulatory Visit: Payer: Medicare Other | Admitting: Endocrinology

## 2014-01-15 ENCOUNTER — Other Ambulatory Visit: Payer: Self-pay | Admitting: Physician Assistant

## 2014-02-19 ENCOUNTER — Encounter: Payer: Self-pay | Admitting: Cardiology

## 2014-02-20 ENCOUNTER — Encounter: Payer: Self-pay | Admitting: Cardiology

## 2014-02-20 ENCOUNTER — Ambulatory Visit (INDEPENDENT_AMBULATORY_CARE_PROVIDER_SITE_OTHER): Payer: Medicare Other | Admitting: Cardiology

## 2014-02-20 VITALS — BP 151/79 | HR 76 | Ht 66.0 in | Wt 236.8 lb

## 2014-02-20 DIAGNOSIS — R55 Syncope and collapse: Secondary | ICD-10-CM

## 2014-02-20 DIAGNOSIS — I10 Essential (primary) hypertension: Secondary | ICD-10-CM

## 2014-02-20 DIAGNOSIS — I498 Other specified cardiac arrhythmias: Secondary | ICD-10-CM

## 2014-02-20 DIAGNOSIS — E785 Hyperlipidemia, unspecified: Secondary | ICD-10-CM

## 2014-02-20 DIAGNOSIS — I739 Peripheral vascular disease, unspecified: Secondary | ICD-10-CM

## 2014-02-20 DIAGNOSIS — I2510 Atherosclerotic heart disease of native coronary artery without angina pectoris: Secondary | ICD-10-CM

## 2014-02-20 DIAGNOSIS — R001 Bradycardia, unspecified: Secondary | ICD-10-CM

## 2014-02-20 DIAGNOSIS — I779 Disorder of arteries and arterioles, unspecified: Secondary | ICD-10-CM

## 2014-02-20 DIAGNOSIS — I6529 Occlusion and stenosis of unspecified carotid artery: Secondary | ICD-10-CM

## 2014-02-20 MED ORDER — ATORVASTATIN CALCIUM 40 MG PO TABS: 20.0000 mg | ORAL_TABLET | Freq: Every day | ORAL | Status: DC

## 2014-02-20 NOTE — Assessment & Plan Note (Signed)
Her episode of syncope in February, 2015 appeared to be related to dehydration and a change in her renal function. No further cardiac workup at this time.  As part of today's evaluation I spent greater than 25 minutes with her total care. More than half of this time is been spent with direct contact with her discussing all of her medical problems and making the plans as I have outlined.

## 2014-02-20 NOTE — Assessment & Plan Note (Signed)
The patient has significant carotid disease. She is followed very carefully by Dr. Kellie Simmering.

## 2014-02-20 NOTE — Assessment & Plan Note (Signed)
Blood pressure is controlled. No change in therapy. 

## 2014-02-20 NOTE — Progress Notes (Signed)
Patient ID: Donna Howe, female   DOB: 12/27/1955, 59 y.o.   MRN: [id number]    HPI  Patient is seen today to followup coronary disease and carotid artery disease. I saw her last August, 2014. She had a right carotid endarterectomy in the past. She has known significant coronary disease. Within the past several months there was question that she might have leg aching related to her 80 mg of atorvastatin. This was stopped. Up to this point we are not sure that it made a difference. Her statin has not been restarted. She was hospitalized in February, 2015. She had a syncopal episode. It was felt to be related to dehydration. Around that time she had a followup carotid Doppler. The study showed the possibility of 50% restenosis of the right carotid. This is the side that she had had surgery on. She has seen Dr. Kellie Simmering for followup after this Doppler and he plans to follow her carefully.  As part of today's evaluation I have reviewed my prior records. I have reviewed the hospital records concerning her admission. I have reviewed the Doppler data. I have reviewed Dr. Kellie Simmering as outpatient notes. I have updated the current electronic medical record.  The patient is not having any significant chest pain or shortness of breath at this time.  Allergies  Allergen Reactions  . Penicillins Other (See Comments)    REACTION: Unknown, told as a child    Current Outpatient Prescriptions  Medication Sig Dispense Refill  . aspirin EC 81 MG tablet Take 81 mg by mouth daily.      . clopidogrel (PLAVIX) 75 MG tablet Take 1 tablet (75 mg total) by mouth daily.  30 tablet  11  . clotrimazole-betamethasone (LOTRISONE) cream Apply topically 3 (three) times daily. As needed for rash  45 g  2  . colchicine 0.6 MG tablet Take 0.6 mg by mouth daily.       . furosemide (LASIX) 40 MG tablet Take 1 tablet (40 mg total) by mouth daily. Restart in 3 days  30 tablet    . gabapentin (NEURONTIN) 400 MG capsule Take 400 mg by  mouth at bedtime.      . insulin NPH Human (HUMULIN N,NOVOLIN N) 100 UNIT/ML injection Inject 60 Units into the skin every morning. 30 units in the am and 30 units in the pm      . isosorbide dinitrate (ISORDIL) 20 MG tablet TAKE ONE TABLET BY MOUTH ONCE DAILY      . levothyroxine (SYNTHROID, LEVOTHROID) 100 MCG tablet Take 100 mcg by mouth daily before breakfast.      . nitroGLYCERIN (NITROSTAT) 0.4 MG SL tablet Place 0.4 mg under the tongue every 5 (five) minutes as needed for chest pain. Not to exceed 3 in 15 minute time frame      . oxyCODONE-acetaminophen (PERCOCET) 10-325 MG per tablet Take 1 tablet by mouth every 6 (six) hours as needed for pain.  30 tablet  0   No current facility-administered medications for this visit.    History   Social History  . Marital Status: Divorced    Spouse Name: N/A    Number of Children: N/A  . Years of Education: N/A   Occupational History  . Disabled    Social History Main Topics  . Smoking status: Former Smoker -- 1.00 packs/day for 20 years    Types: Cigarettes    Quit date: 12/10/2012  . Smokeless tobacco: Never Used  . Alcohol Use: No  .  Drug Use: No  . Sexual Activity: Not on file   Other Topics Concern  . 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 Diabetes Mother   . Heart disease Mother   . Hyperlipidemia Mother   . Hypertension Mother   . Thyroid disease Father   . Hypertension Father   . AAA (abdominal aortic aneurysm) Father   . Heart disease Brother   . Hyperlipidemia Son   . Hypertension Son     Past Medical History  Diagnosis Date  . Hypercholesteremia   . Hypertension   . HYPERLIPIDEMIA 03/31/2007  . Overweight 08/26/2009  . HYPERTENSION 03/31/2007  . CAD (coronary artery disease) 03/31/2007  . ASTHMA 08/26/2009  . Gout   . SBO (small bowel obstruction) 2011    lap lysis of adhesions & hernia repair  . Hx of  CABG     2005  . Ejection fraction     Normal EF echo 2010  //   EF 60%, catheterization, April, 2014  . Anginal pain   . Shortness of breath   . Diabetes mellitus   . DIABETES MELLITUS, TYPE II 03/31/2007  . Heart murmur   . PAD (peripheral artery disease)     Dr. Kellie Simmering  . Carotid artery occlusion   . RENAL FAILURE 03/31/2007    NOT ON HD ( PCP ONLY DR Rory Percy)  . Chronic kidney disease (CKD), stage III (moderate)   . Arthritis     GOUT  . Carotid artery disease   . Umbilical hernia   . Thrombocytopenia, unspecified 10/28/2013    Past Surgical History  Procedure Laterality Date  . Cholecystectomy  2010  . Hernia repair  1989    open w mesh  . Cesarean section  1984  . Cardiac catheterization  07/18/2004  . Cardiac stents  2006-2007  . Incisional hernia repair x2  03/04/2010     laparoscopic with 35cm mesh by Dr Ronnald Collum  . Shoulder surgery    . Coronary artery bypass graft  2005  . Coronary angioplasty with stent placement  12/19/2012    [facility]    DES  . Endarterectomy Right 04/18/2013    Procedure: ENDARTERECTOMY CAROTID;  Surgeon: Mal Misty, MD;  Location: [place];  Service: Vascular;  Laterality: Right;  . Patch angioplasty Right 04/18/2013    Procedure: PATCH ANGIOPLASTY Right Internal Carotid Artery;  Surgeon: Mal Misty, MD;  Location: [place];  Service: Vascular;  Laterality: Right;    Patient Active Problem List   Diagnosis Date Noted  . Diabetes mellitus 10/28/2013  . Hypoglycemia associated with diabetes 10/28/2013  . Thrombocytopenia, unspecified 10/28/2013  . Hypokalemia 10/27/2013  . Syncope 10/26/2013  . Anemia 10/26/2013  . Renal failure, acute on chronic 10/26/2013  . Fracture of toe of right foot 10/26/2013  . Umbilical hernia   . Carotid artery disease   . Occlusion and stenosis of carotid artery without mention of cerebral infarction 04/15/2013  . Hx of CABG   . Ejection fraction   . Atherosclerosis of native arteries of the  extremities with intermittent claudication 02/11/2013  . Diabetes mellitus with renal manifestation 01/21/2013  . Bradycardia 01/03/2013  . Chronic kidney disease (CKD), stage III (moderate)   . Gout   . PROTEINURIA, MILD 01/18/2010  . TOBACCO ABUSE 08/27/2009  . Obesity (BMI 30-39.9) 08/26/2009  .  ASTHMA 08/26/2009  . HYPERLIPIDEMIA 03/31/2007  . HYPERTENSION 03/31/2007  . CAD (coronary artery disease) 03/31/2007    ROS   Patient denies fever, chills, headache, sweats, rash, change in vision, change in hearing, chest pain, cough, nausea vomiting, urinary symptoms. All other systems are reviewed and are negative.  PHYSICAL EXAM   Patient is overweight. She is oriented to person time and place. Affect is normal. Head is atraumatic. Sclera and conjunctiva are normal. I do not hear carotid bruits. There is no jugulovenous distention. Lungs are clear. Respiratory effort is nonlabored. Cardiac exam reveals S1 and S2. Abdomen is soft. There is no peripheral edema. There are no musculoskeletal deformities. There are no skin rashes.  Filed Vitals:   02/20/14 1401  BP: 151/79  Pulse: 76  Height: 5\' 6"  (1.676 m)  Weight: 236 lb 12.8 oz (107.412 kg)     ASSESSMENT & PLAN

## 2014-02-20 NOTE — Assessment & Plan Note (Signed)
The patient was taken off atorvastatin when she had some leg aching. We are not sure of this helped. I definitely want to try to restart her atorvastatin if we can. She has 40 mg tabs at home. She will start by taking and 20 mg dose each day. She will call us back in several weeks to let us know how she's feeling.

## 2014-02-20 NOTE — Patient Instructions (Signed)
   Resume Atorvastatin (Lipitor) 40mg , but at 1/2 (20mg ) daily - can take medication you already have at home Continue all other medications.   Please call office in about 3 weeks with an update - let us know if this affects legs or not Your physician wants you to follow up in: 6 months.  You will receive a reminder letter in the mail one-two months in advance.  If you don't receive a letter, please call our office to schedule the follow up appointment

## 2014-02-20 NOTE — Assessment & Plan Note (Signed)
She is not having any significant bradycardia. No further workup.

## 2014-02-20 NOTE — Assessment & Plan Note (Signed)
Coronary disease is stable. She had drug-eluting stent to the left main and circumflex in April, 2014. She did undergo repeat catheterization in August, 2014. This was done when she had some chest discomfort in the hospital shortly after her carotid endarterectomy. Her coronaries were stable at that time and no intervention was needed. She is stable now. No further workup.

## 2014-03-17 ENCOUNTER — Telehealth: Payer: Self-pay | Admitting: *Deleted

## 2014-03-17 NOTE — Telephone Encounter (Signed)
Great.  Continue the same.

## 2014-03-17 NOTE — Telephone Encounter (Signed)
Patient called to inform Dr. Ron Parker that she has been taking the lipitor and is tolerating it well. No c/o of myalgias.

## 2014-06-09 ENCOUNTER — Other Ambulatory Visit (HOSPITAL_COMMUNITY): Payer: Medicare Other

## 2014-06-09 ENCOUNTER — Ambulatory Visit: Payer: Medicare Other | Admitting: Vascular Surgery

## 2014-07-13 ENCOUNTER — Encounter: Payer: Self-pay | Admitting: Vascular Surgery

## 2014-07-14 ENCOUNTER — Ambulatory Visit (HOSPITAL_COMMUNITY)
Admission: RE | Admit: 2014-07-14 | Discharge: 2014-07-14 | Disposition: A | Discharge status: Home / Self Care | Payer: Medicare Other | Source: Ambulatory Visit | Attending: Vascular Surgery | Admitting: Vascular Surgery

## 2014-07-14 ENCOUNTER — Ambulatory Visit (INDEPENDENT_AMBULATORY_CARE_PROVIDER_SITE_OTHER): Payer: Medicare Other | Admitting: Vascular Surgery

## 2014-07-14 ENCOUNTER — Encounter: Payer: Self-pay | Admitting: Vascular Surgery

## 2014-07-14 VITALS — BP 152/93 | HR 66 | Ht 66.0 in | Wt 241.3 lb

## 2014-07-14 DIAGNOSIS — Z48812 Encounter for surgical aftercare following surgery on the circulatory system: Secondary | ICD-10-CM | POA: Diagnosis not present

## 2014-07-14 DIAGNOSIS — I6523 Occlusion and stenosis of bilateral carotid arteries: Secondary | ICD-10-CM | POA: Diagnosis present

## 2014-07-14 DIAGNOSIS — I6529 Occlusion and stenosis of unspecified carotid artery: Secondary | ICD-10-CM

## 2014-07-14 NOTE — Addendum Note (Signed)
Addended by: Mena Goes on: 07/14/2014 04:45 PM   Modules accepted: Orders

## 2014-07-14 NOTE — Progress Notes (Signed)
Subjective:     Patient ID: Donna Howe, female   DOB: 02-16-1956, 58 y.o.   MRN: [id number]  HPI this 58 year old female returns for continued follow-up regarding her right carotid endarterectomy which I performed August 2014 for asymptomatic severe stenosis. She has had no neurologic symptoms recently. She did have one isolated syncopal episode in February 2015 carotid duplex at that time suggested a slight restenosis in the right carotid endarterectomy site. She has minimal left internal carotid flow reduction. She denies any lateralizing weakness, aphasia, amaurosis fugax, diplopia, blurred vision, or other syncopal episodes. She does not experience chest pain. She takes aspirin daily.  Past Medical History  Diagnosis Date  . Hypercholesteremia   . Hypertension   . HYPERLIPIDEMIA 03/31/2007  . Overweight(278.02) 08/26/2009  . HYPERTENSION 03/31/2007  . CAD (coronary artery disease) 03/31/2007  . ASTHMA 08/26/2009  . Gout   . SBO (small bowel obstruction) 2011    lap lysis of adhesions & hernia repair  . Hx of CABG     2005  . Ejection fraction     Normal EF echo 2010  //   EF 60%, catheterization, April, 2014  . Anginal pain   . Shortness of breath   . Diabetes mellitus   . DIABETES MELLITUS, TYPE II 03/31/2007  . Heart murmur   . PAD (peripheral artery disease)     Dr. Kellie Simmering  . Carotid artery occlusion   . RENAL FAILURE 03/31/2007    NOT ON HD ( PCP ONLY DR Rory Percy)  . Chronic kidney disease (CKD), stage III (moderate)   . Arthritis     GOUT  . Carotid artery disease   . Umbilical hernia   . Thrombocytopenia, unspecified 10/28/2013  . Anemia   . CAD (coronary artery disease)   . Myocardial infarction     History  Substance Use Topics  . Smoking status: Former Smoker -- 1.00 packs/day for 20 years    Types: Cigarettes    Quit date: 12/10/2012  . Smokeless tobacco: Never Used  . Alcohol Use: No    Family History  Problem Relation Age of Onset  . Diabetes  Mother   . Heart disease Mother     before age 3  . Hyperlipidemia Mother   . Hypertension Mother   . Thyroid disease Father   . Hypertension Father   . AAA (abdominal aortic aneurysm) Father   . Heart disease Brother     before age 41  . Hypertension Brother   . Hyperlipidemia Son   . Hypertension Son     Allergies  Allergen Reactions  . Penicillins Other (See Comments)    REACTION: Unknown, told as a child    Current outpatient prescriptions:aspirin EC 81 MG tablet, Take 81 mg by mouth daily., Disp: , Rfl: ;  atorvastatin (LIPITOR) 40 MG tablet, Take 0.5 tablets (20 mg total) by mouth daily., Disp: , Rfl: ;  clopidogrel (PLAVIX) 75 MG tablet, Take 1 tablet (75 mg total) by mouth daily., Disp: 30 tablet, Rfl: 11;  clotrimazole-betamethasone (LOTRISONE) cream, Apply topically 3 (three) times daily. As needed for rash, Disp: 45 g, Rfl: 2 colchicine 0.6 MG tablet, Take 0.6 mg by mouth daily. , Disp: , Rfl: ;  furosemide (LASIX) 40 MG tablet, Take 1 tablet (40 mg total) by mouth daily. Restart in 3 days, Disp: 30 tablet, Rfl: ;  gabapentin (NEURONTIN) 400 MG capsule, Take 400 mg by mouth at bedtime., Disp: , Rfl: ;  insulin NPH Human (HUMULIN  N,NOVOLIN N) 100 UNIT/ML injection, Inject 60 Units into the skin every morning. 30 units in the am and 30 units in the pm, Disp: , Rfl:  isosorbide dinitrate (ISORDIL) 20 MG tablet, TAKE ONE TABLET BY MOUTH ONCE DAILY, Disp: , Rfl: ;  levothyroxine (SYNTHROID, LEVOTHROID) 100 MCG tablet, Take 125 mcg by mouth daily before breakfast. , Disp: , Rfl: ;  nitroGLYCERIN (NITROSTAT) 0.4 MG SL tablet, Place 0.4 mg under the tongue every 5 (five) minutes as needed for chest pain. Not to exceed 3 in 15 minute time frame, Disp: , Rfl:  oxyCODONE-acetaminophen (PERCOCET) 10-325 MG per tablet, Take 1 tablet by mouth every 6 (six) hours as needed for pain., Disp: 30 tablet, Rfl: 0  BP 152/93  Pulse 66  Ht 5\' 6"  (1.676 m)  Wt 241 lb 4.8 oz (109.453 kg)  BMI 38.97  kg/m2  SpO2 100%  Body mass index is 38.97 kg/(m^2).         Review of Systems patient denies chest pain but does have dyspnea on exertion. Has history of recurrent hernia repair with previous small bowel obstruction. Also previous coronary artery bypass grafting in 2005. Patient has type 1 diabetes mellitus past 3 years. Patient has morbid obesity. Also history of skin rashes. All other systems negative complete review of systems    Objective:   Physical Exam BP 152/93  Pulse 66  Ht 5\' 6"  (1.676 m)  Wt 241 lb 4.8 oz (109.453 kg)  BMI 38.97 kg/m2  SpO2 100%  Gen.-alert and oriented x3 in no apparent distress-morbidly obese HEENT normal for age Lungs no rhonchi or wheezing Cardiovascular regular rhythm no murmurs carotid pulses 3+ palpable no bruits audible Abdomen soft nontender no palpable masses Musculoskeletal free of  major deformities Skin clear -no rashes Neurologic normal Lower extremities 3+ femoral and dorsalis pedis pulses palpable bilaterally with no edema  Today I ordered a carotid duplex exam which I reviewed and interpreted and compared to previous study report from [facility]. We have not performed a carotid duplex in our office since preoperatively. Patient has some very mild narrowing in the right carotid endarterectomy site distally which appears to be due to a change in vessel diameter since there is no plaque formation. The left internal carotid has minimal stenosis-less than 40%      Assessment:     doing well 14 months post right carotid endarterectomy for a severe asymptomatic stenosis     Plan:     Return in one year for carotid duplex exam and see nurse practitioner Continue daily aspirin

## 2014-08-25 ENCOUNTER — Encounter: Payer: Self-pay | Admitting: Cardiology

## 2014-08-25 ENCOUNTER — Ambulatory Visit (INDEPENDENT_AMBULATORY_CARE_PROVIDER_SITE_OTHER): Payer: Medicare Other | Admitting: Cardiology

## 2014-08-25 VITALS — BP 164/81 | HR 70 | Ht 66.0 in | Wt 243.8 lb

## 2014-08-25 DIAGNOSIS — E785 Hyperlipidemia, unspecified: Secondary | ICD-10-CM

## 2014-08-25 DIAGNOSIS — I2510 Atherosclerotic heart disease of native coronary artery without angina pectoris: Secondary | ICD-10-CM

## 2014-08-25 DIAGNOSIS — R55 Syncope and collapse: Secondary | ICD-10-CM

## 2014-08-25 DIAGNOSIS — I10 Essential (primary) hypertension: Secondary | ICD-10-CM

## 2014-08-25 MED ORDER — ATORVASTATIN CALCIUM 40 MG PO TABS: 40.0000 mg | ORAL_TABLET | Freq: Every day | ORAL | Status: DC

## 2014-08-25 NOTE — Assessment & Plan Note (Signed)
Coronary status is stable. No further workup is needed today.

## 2014-08-25 NOTE — Assessment & Plan Note (Signed)
There've been questioned that she was having symptoms from her statin. It was held and she did not feel any different. She was restarted on 20 mg of atorvastatin. She's tolerating this without difficulty. Her dose will now be pushed back up to 40 mg daily.

## 2014-08-25 NOTE — Patient Instructions (Signed)
Your physician recommends that you schedule a follow-up appointment in: 6 months. You will receive a reminder letter in the mail in about 4 months reminding you to call and schedule your appointment. If you don't receive this letter, please contact our office. Your physician has recommended you make the following change in your medication:  Increase your atorvastatin to 40 mg daily. You may take (2) of your 20 mg daily until they are finished. Continue all other medications the same.

## 2014-08-25 NOTE — Progress Notes (Signed)
Patient ID: Donna Howe, female   DOB: [birth year], 58 y.o.   MRN: [id number]    HPI Patient is seen today to follow-up coronary artery disease. Her vascular disease is followed by Dr. Kellie Simmering of the vascular surgeons. She was seen by him and her status is stable. She is not having any significant chest pain. At some point she needs follow-up hernia surgery that will need to be done in [place]. Her cardiac status should be stable for this.  Allergies  Allergen Reactions  . Penicillins Other (See Comments)    REACTION: Unknown, told as a child    Current Outpatient Prescriptions  Medication Sig Dispense Refill  . aspirin EC 81 MG tablet Take 81 mg by mouth daily.    Marland Kitchen atorvastatin (LIPITOR) 20 MG tablet Take 20 mg by mouth every evening.    . clopidogrel (PLAVIX) 75 MG tablet Take 1 tablet (75 mg total) by mouth daily. 30 tablet 11  . clotrimazole-betamethasone (LOTRISONE) cream Apply topically 3 (three) times daily. As needed for rash 45 g 2  . colchicine 0.6 MG tablet Take 0.6 mg by mouth daily.     . furosemide (LASIX) 40 MG tablet Take 1 tablet (40 mg total) by mouth daily. Restart in 3 days 30 tablet   . gabapentin (NEURONTIN) 400 MG capsule Take 400 mg by mouth at bedtime.    . insulin NPH Human (HUMULIN N,NOVOLIN N) 100 UNIT/ML injection Inject 60 Units into the skin every morning. 30 units in the am and 30 units in the pm    . isosorbide dinitrate (ISORDIL) 20 MG tablet TAKE ONE TABLET BY MOUTH ONCE DAILY    . levothyroxine (SYNTHROID, LEVOTHROID) 125 MCG tablet Take 125 mcg by mouth every morning.    . nitroGLYCERIN (NITROSTAT) 0.4 MG SL tablet Place 0.4 mg under the tongue every 5 (five) minutes as needed for chest pain. Not to exceed 3 in 15 minute time frame    . oxyCODONE-acetaminophen (PERCOCET) 10-325 MG per tablet Take 1 tablet by mouth every 6 (six) hours as needed for pain. 30 tablet 0   No current facility-administered medications for this visit.    History    Social History  . Marital Status: Divorced    Spouse Name: N/A    Number of Children: N/A  . Years of Education: N/A   Occupational History  . Disabled    Social History Main Topics  . Smoking status: Former Smoker -- 1.00 packs/day for 20 years    Types: Cigarettes    Quit date: 12/10/2012  . Smokeless tobacco: Never Used  . Alcohol Use: No  . Drug Use: No  . Sexual Activity: Not on file   Other Topics Concern  . 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 Diabetes Mother   . Heart disease Mother     before age 57  . Hyperlipidemia Mother   . Hypertension Mother   . Thyroid disease Father   . Hypertension Father   . AAA (abdominal aortic aneurysm) Father   . Heart disease Brother     before age 27  . Hypertension Brother   . Hyperlipidemia Son   . Hypertension Son     Past Medical History  Diagnosis Date  . Hypercholesteremia   . Hypertension   . HYPERLIPIDEMIA 03/31/2007  . Overweight(278.02) 08/26/2009  . HYPERTENSION  03/31/2007  . CAD (coronary artery disease) 03/31/2007  . ASTHMA 08/26/2009  . Gout   . SBO (small bowel obstruction) 2011    lap lysis of adhesions & hernia repair  . Hx of CABG     2005  . Ejection fraction     Normal EF echo 2010  //   EF 60%, catheterization, April, 2014  . Anginal pain   . Shortness of breath   . Diabetes mellitus   . DIABETES MELLITUS, TYPE II 03/31/2007  . Heart murmur   . PAD (peripheral artery disease)     Dr. Kellie Simmering  . Carotid artery occlusion   . RENAL FAILURE 03/31/2007    NOT ON HD ( PCP ONLY DR Rory Percy)  . Chronic kidney disease (CKD), stage III (moderate)   . Arthritis     GOUT  . Carotid artery disease   . Umbilical hernia   . Thrombocytopenia, unspecified 10/28/2013  . Anemia   . CAD (coronary artery disease)   . Myocardial infarction     Past Surgical History  Procedure Laterality Date  .  Cholecystectomy  2010  . Hernia repair  1989    open w mesh  . Cesarean section  1984  . Cardiac catheterization  07/18/2004  . Cardiac stents  2006-2007  . Incisional hernia repair x2  03/04/2010     laparoscopic with 35cm mesh by Dr Ronnald Collum  . Shoulder surgery    . Coronary artery bypass graft  2005  . Coronary angioplasty with stent placement  12/19/2012    [facility]    DES  . Endarterectomy Right 04/18/2013    Procedure: ENDARTERECTOMY CAROTID;  Surgeon: Mal Misty, MD;  Location: [place];  Service: Vascular;  Laterality: Right;  . Patch angioplasty Right 04/18/2013    Procedure: PATCH ANGIOPLASTY Right Internal Carotid Artery;  Surgeon: Mal Misty, MD;  Location: [place];  Service: Vascular;  Laterality: Right;    Patient Active Problem List   Diagnosis Date Noted  . Carotid stenosis 07/14/2014  . Diabetes mellitus 10/28/2013  . Hypoglycemia associated with diabetes 10/28/2013  . Thrombocytopenia, unspecified 10/28/2013  . Hypokalemia 10/27/2013  . Syncope 10/26/2013  . Anemia 10/26/2013  . Renal failure, acute on chronic 10/26/2013  . Fracture of toe of right foot 10/26/2013  . Umbilical hernia   . Carotid artery disease   . Occlusion and stenosis of carotid artery without mention of cerebral infarction 04/15/2013  . Hx of CABG   . Ejection fraction   . Atherosclerosis of native arteries of the extremities with intermittent claudication 02/11/2013  . Diabetes mellitus with renal manifestation 01/21/2013  . Bradycardia 01/03/2013  . Chronic kidney disease (CKD), stage III (moderate)   . Gout   . PROTEINURIA, MILD 01/18/2010  . TOBACCO ABUSE 08/27/2009  . Obesity (BMI 30-39.9) 08/26/2009  . ASTHMA 08/26/2009  . HYPERLIPIDEMIA 03/31/2007  . HYPERTENSION 03/31/2007  . CAD (coronary artery disease) 03/31/2007    ROS  Patient denies fever, chills, headache, sweats, rash, change in vision, change in hearing, chest pain, cough, nausea or vomiting, urinary symptoms.  All other systems are reviewed and are negative.  PHYSICAL EXAM Patient is overweight. She is oriented to person time and place. Affect is normal. Head is atraumatic. Sclera and conjunctiva are normal. There is no jugulovenous distention. Lungs are clear. Respiratory effort is unlabored. Cardiac exam reveals S1 and S2. There are no clicks or significant murmurs. Abdomen is soft. There is no peripheral  edema.  Filed Vitals:   08/25/14 0928  BP: 164/81  Pulse: 70  Height: 5\' 6"  (1.676 m)  Weight: 243 lb 12.8 oz (110.587 kg)  SpO2: 96%     ASSESSMENT & PLAN

## 2014-08-25 NOTE — Assessment & Plan Note (Signed)
Blood pressure is elevated with systolic in the 0000000 range. I've encouraged her to go ahead and take her blood pressure at home and take this information to her next visit with Dr. Nadara Mustard. At that time there may be consideration for adjusting her meds for her blood pressure.

## 2014-08-25 NOTE — Assessment & Plan Note (Signed)
There is been no recurrent syncope.

## 2014-08-27 ENCOUNTER — Encounter (HOSPITAL_COMMUNITY): Payer: Self-pay | Admitting: Cardiovascular Disease

## 2014-10-26 ENCOUNTER — Other Ambulatory Visit: Payer: Self-pay

## 2014-10-26 MED ORDER — ISOSORBIDE DINITRATE 20 MG PO TABS: 20.0000 mg | ORAL_TABLET | Freq: Every day | ORAL | Status: DC

## 2015-01-15 ENCOUNTER — Encounter: Payer: Self-pay | Admitting: Cardiology

## 2015-03-03 ENCOUNTER — Encounter: Payer: Self-pay | Admitting: Cardiology

## 2015-03-03 ENCOUNTER — Ambulatory Visit (INDEPENDENT_AMBULATORY_CARE_PROVIDER_SITE_OTHER): Payer: Medicare Other | Admitting: Cardiology

## 2015-03-03 VITALS — BP 186/88 | HR 70 | Ht 66.0 in | Wt 243.8 lb

## 2015-03-03 DIAGNOSIS — I779 Disorder of arteries and arterioles, unspecified: Secondary | ICD-10-CM | POA: Diagnosis not present

## 2015-03-03 DIAGNOSIS — I10 Essential (primary) hypertension: Secondary | ICD-10-CM | POA: Diagnosis not present

## 2015-03-03 DIAGNOSIS — I2510 Atherosclerotic heart disease of native coronary artery without angina pectoris: Secondary | ICD-10-CM

## 2015-03-03 DIAGNOSIS — I739 Peripheral vascular disease, unspecified: Secondary | ICD-10-CM

## 2015-03-03 DIAGNOSIS — E785 Hyperlipidemia, unspecified: Secondary | ICD-10-CM | POA: Diagnosis not present

## 2015-03-03 NOTE — Assessment & Plan Note (Signed)
Her blood pressure is elevated today. She is insistent that her blood pressure is in the normal range at home. I've asked her to follow-up with her primary physician.

## 2015-03-03 NOTE — Assessment & Plan Note (Signed)
I was able to push her atorvastatin dose up to 40 mg at the time of her last visit.

## 2015-03-03 NOTE — Patient Instructions (Signed)
Your physician wants you to follow-up in: 6 months. You will receive a reminder letter in the mail two months in advance. If you don't receive a letter, please call our office to schedule the follow-up appointment.  Your physician recommends that you continue on your current medications as directed. Please refer to the Current Medication list given to you today.   Thank you for choosing [facility]!    

## 2015-03-03 NOTE — Assessment & Plan Note (Signed)
Patient underwent CABG in 2005. She had a PCI in 2006, 2007, and 2011. She had a positive nuclear scan in April, 2014. She received a drug-eluting stent to the left main and circumflex for severe in-stent restenosis. The patient then had carotid endarterectomy in August, 2014. Her clinical status at that time warranted proceeding with cardiac catheterization the day after her endarterectomy. Her anatomy was stable. She has been stable since that time. No further workup.

## 2015-03-03 NOTE — Progress Notes (Signed)
Cardiology Office Note   Date:  03/03/2015   ID:  Donna Howe, DOB 05/16/1956, MRN [id number]  PCP:  Rory Percy, MD  Cardiologist:  Dola Argyle, MD   Chief Complaint  Patient presents with  . Appointment    Follow-up coronary artery disease      History of Present Illness: Donna Howe is a 59 y.o. female who presents today to follow-up coronary disease. She underwent CABG in 2005. She had other procedures after that. She underwent catheterization in 201 for 2 days after a carotid endarterectomy. Her anatomy was stable. She is not had any significant chest pain.    Past Medical History  Diagnosis Date  . Hypercholesteremia   . Hypertension   . HYPERLIPIDEMIA 03/31/2007  . Overweight(278.02) 08/26/2009  . HYPERTENSION 03/31/2007  . CAD (coronary artery disease) 03/31/2007  . ASTHMA 08/26/2009  . Gout   . SBO (small bowel obstruction) 2011    lap lysis of adhesions & hernia repair  . Hx of CABG     2005  . Ejection fraction     Normal EF echo 2010  //   EF 60%, catheterization, April, 2014  . Anginal pain   . Shortness of breath   . Diabetes mellitus   . DIABETES MELLITUS, TYPE II 03/31/2007  . Heart murmur   . PAD (peripheral artery disease)     Dr. Kellie Simmering  . Carotid artery occlusion   . RENAL FAILURE 03/31/2007    NOT ON HD ( PCP ONLY DR Rory Percy)  . Chronic kidney disease (CKD), stage III (moderate)   . Arthritis     GOUT  . Carotid artery disease   . Umbilical hernia   . Thrombocytopenia, unspecified 10/28/2013  . Anemia   . CAD (coronary artery disease)   . Myocardial infarction     Past Surgical History  Procedure Laterality Date  . Cholecystectomy  2010  . Hernia repair  1989    open w mesh  . Cesarean section  1984  . Cardiac catheterization  07/18/2004  . Cardiac stents  2006-2007  . Incisional hernia repair x2  03/04/2010     laparoscopic with 35cm mesh by Dr Ronnald Collum  . Shoulder surgery    . Coronary artery bypass graft   2005  . Coronary angioplasty with stent placement  12/19/2012    [facility]    DES  . Endarterectomy Right 04/18/2013    Procedure: ENDARTERECTOMY CAROTID;  Surgeon: Mal Misty, MD;  Location: [place];  Service: Vascular;  Laterality: Right;  . Patch angioplasty Right 04/18/2013    Procedure: PATCH ANGIOPLASTY Right Internal Carotid Artery;  Surgeon: Mal Misty, MD;  Location: [place];  Service: Vascular;  Laterality: Right;  . Left heart catheterization with coronary angiogram N/A 12/19/2012    Procedure: LEFT HEART CATHETERIZATION WITH CORONARY ANGIOGRAM;  Surgeon: Josue Hector, MD;  Location: [facility] LAB;  Service: Cardiovascular;  Laterality: N/A;  . Percutaneous coronary stent intervention (pci-s) Right 12/19/2012    Procedure: PERCUTANEOUS CORONARY STENT INTERVENTION (PCI-S);  Surgeon: Josue Hector, MD;  Location: [facility] CATH LAB;  Service: Cardiovascular;  Laterality: Right;  . Left heart catheterization with coronary/graft angiogram N/A 04/19/2013    Procedure: LEFT HEART CATHETERIZATION WITH Beatrix Fetters;  Surgeon: Lorretta Harp, MD;  Location: [facility] LAB;  Service: Cardiovascular;  Laterality: N/A;    Patient Active Problem List   Diagnosis Date Noted  . Diabetes mellitus 10/28/2013  .  Hypoglycemia associated with diabetes 10/28/2013  . Thrombocytopenia, unspecified 10/28/2013  . Hypokalemia 10/27/2013  . Syncope 10/26/2013  . Anemia 10/26/2013  . Renal failure, acute on chronic 10/26/2013  . Fracture of toe of right foot 10/26/2013  . Umbilical hernia   . Carotid artery disease   . Occlusion and stenosis of carotid artery without mention of cerebral infarction 04/15/2013  . Hx of CABG   . Ejection fraction   . Atherosclerosis of native arteries of the extremities with intermittent claudication 02/11/2013  . Diabetes mellitus with renal manifestation 01/21/2013  . Bradycardia 01/03/2013  . Chronic kidney disease (CKD), stage III (moderate)   . Gout   .  PROTEINURIA, MILD 01/18/2010  . TOBACCO ABUSE 08/27/2009  . Obesity (BMI 30-39.9) 08/26/2009  . ASTHMA 08/26/2009  . Hyperlipidemia 03/31/2007  . Essential hypertension 03/31/2007  . CAD (coronary artery disease) 03/31/2007      Current Outpatient Prescriptions  Medication Sig Dispense Refill  . aspirin EC 81 MG tablet Take 81 mg by mouth daily.    Marland Kitchen atorvastatin (LIPITOR) 40 MG tablet Take 1 tablet (40 mg total) by mouth daily. 90 tablet 3  . clopidogrel (PLAVIX) 75 MG tablet Take 1 tablet (75 mg total) by mouth daily. 30 tablet 11  . clotrimazole-betamethasone (LOTRISONE) cream Apply topically 3 (three) times daily. As needed for rash 45 g 2  . colchicine 0.6 MG tablet Take 0.6 mg by mouth daily.     . furosemide (LASIX) 40 MG tablet Take 1 tablet (40 mg total) by mouth daily. Restart in 3 days 30 tablet   . gabapentin (NEURONTIN) 400 MG capsule Take 400 mg by mouth at bedtime.    . insulin NPH Human (HUMULIN N,NOVOLIN N) 100 UNIT/ML injection Inject into the skin every morning. 44 units in the am and 44 units in the pm    . isosorbide dinitrate (ISORDIL) 20 MG tablet Take 1 tablet (20 mg total) by mouth daily. 30 tablet 2  . levothyroxine (SYNTHROID, LEVOTHROID) 125 MCG tablet Take 125 mcg by mouth every morning.    . nitroGLYCERIN (NITROSTAT) 0.4 MG SL tablet Place 0.4 mg under the tongue every 5 (five) minutes as needed for chest pain. Not to exceed 3 in 15 minute time frame    . oxyCODONE-acetaminophen (PERCOCET) 10-325 MG per tablet Take 1 tablet by mouth every 6 (six) hours as needed for pain. 30 tablet 0   No current facility-administered medications for this visit.    Allergies:   Penicillins    Social History:  The patient  reports that she quit smoking about 2 years ago. Her smoking use included Cigarettes. She has a 20 pack-year smoking history. She has never used smokeless tobacco. She reports that she does not drink alcohol or use illicit drugs.   Family History:  The  patient's  family history includes AAA (abdominal aortic aneurysm) in her father; Diabetes in her mother; Heart disease in her brother and mother; Hyperlipidemia in her mother and son; Hypertension in her brother, father, mother, and son; Thyroid disease in her father.    ROS:  Please see the history of present illness.    Patient denies fever, chills, headache, sweats, rash, change in vision, change in hearing, chest pain, cough, nausea or vomiting, urinary symptoms. All other systems are reviewed and are negative.    PHYSICAL EXAM: VS:  BP 186/88 mmHg  Pulse 70  Ht 5\' 6"  (1.676 m)  Wt 243 lb 12.8 oz (110.587 kg)  BMI  39.37 kg/m2  SpO2 96% , Patient is oriented to person time and place. Affect is normal. Head is atraumatic. Sclera and conjunctiva are normal. She is overweight. There is no jugulovenous distention. Lungs are clear. Respiratory effort is not labored. Cardiac exam reveals S1 and S2. Abdomen is soft. There is no peripheral edema. There are no skin rashes.  EKG:   EKG is done today and reviewed by me. There is sinus rhythm. There are nonspecific ST-T wave changes.   Recent Labs: No results found for requested labs within last 365 days.    Lipid Panel No results found for: CHOL, TRIG, HDL, CHOLHDL, VLDL, LDLCALC, LDLDIRECT    Wt Readings from Last 3 Encounters:  03/03/15 243 lb 12.8 oz (110.587 kg)  08/25/14 243 lb 12.8 oz (110.587 kg)  07/14/14 241 lb 4.8 oz (109.453 kg)      Current medicines are reviewed  and the patient understands her medications.    ASSESSMENT AND PLAN:

## 2015-03-03 NOTE — Assessment & Plan Note (Signed)
She has significant carotid disease. She is followed carefully by vascular surgery.

## 2015-07-15 ENCOUNTER — Encounter: Payer: Self-pay | Admitting: Family

## 2015-07-20 ENCOUNTER — Ambulatory Visit: Payer: Self-pay | Admitting: Family

## 2015-07-20 ENCOUNTER — Encounter (HOSPITAL_COMMUNITY): Payer: Medicare Other

## 2015-08-02 ENCOUNTER — Encounter: Payer: Self-pay | Admitting: Family

## 2015-08-04 ENCOUNTER — Ambulatory Visit (HOSPITAL_COMMUNITY)
Admission: RE | Admit: 2015-08-04 | Discharge: 2015-08-04 | Disposition: A | Discharge status: Home / Self Care | Payer: Medicare Other | Source: Ambulatory Visit | Attending: Vascular Surgery | Admitting: Vascular Surgery

## 2015-08-04 ENCOUNTER — Ambulatory Visit (INDEPENDENT_AMBULATORY_CARE_PROVIDER_SITE_OTHER): Payer: Medicare Other | Admitting: Family

## 2015-08-04 ENCOUNTER — Encounter: Payer: Self-pay | Admitting: Family

## 2015-08-04 VITALS — BP 140/90 | HR 60 | Temp 97.5°F | Resp 16 | Ht 65.0 in | Wt 229.0 lb

## 2015-08-04 DIAGNOSIS — Z87891 Personal history of nicotine dependence: Secondary | ICD-10-CM | POA: Diagnosis not present

## 2015-08-04 DIAGNOSIS — Z48812 Encounter for surgical aftercare following surgery on the circulatory system: Secondary | ICD-10-CM | POA: Diagnosis not present

## 2015-08-04 DIAGNOSIS — Z9889 Other specified postprocedural states: Secondary | ICD-10-CM | POA: Diagnosis not present

## 2015-08-04 DIAGNOSIS — I6529 Occlusion and stenosis of unspecified carotid artery: Secondary | ICD-10-CM | POA: Diagnosis not present

## 2015-08-04 NOTE — Progress Notes (Signed)
Filed Vitals:   08/04/15 0854 08/04/15 0857 08/04/15 0904 08/04/15 0907  BP: 198/94 179/85 157/81 140/90  Pulse: 67 63 61 60  Temp:  97.5 F (36.4 C)    TempSrc:  Oral    Resp:  16    Height:  5\' 5"  (1.651 m)    Weight:  229 lb (103.874 kg)    SpO2:  100%

## 2015-08-04 NOTE — Progress Notes (Signed)
Established Carotid Patient   History of Present Illness  Donna Howe is a 59 y.o. female patient of Dr. Kellie Simmering returns for continued follow-up s/p right carotid endarterectomy in August 2014 for asymptomatic severe stenosis. She has had no neurologic symptoms recently. She did have one isolated syncopal episode in February 2015; carotid duplex at that time suggested a slight restenosis in the right carotid endarterectomy site. She has minimal left internal carotid flow reduction. She denies any lateralizing weakness, aphasia, amaurosis fugax, diplopia, blurred vision, or other syncopal episodes.   Pt states her blood pressure at home usually runs 120'-130's/80'-90's; she states her blood pressure always increases in a medical setting.   The patient reports New Medical or Surgical History: she has a very large right LQ incisional hernia that needs a third repair, she is going to [facility] for this. She first must lose about 12 more pounds, she has lost 18 so far. She had a 4 vessel CABG in 2005, denies any known MI. She denies claudication symptoms with walking, denies non healing wounds in feet/legs. She does have a slow healing wound at her right LQ.  Pt Diabetic: yes, she states her last A1C was 7.1, highest was 11.? Pt smoker: former smoker, quit in 2013  Pt meds include: Statin : yes ASA: yes Other anticoagulants/antiplatelets: Plavix   Past Medical History  Diagnosis Date  . Hypercholesteremia   . Hypertension   . HYPERLIPIDEMIA 03/31/2007  . Overweight(278.02) 08/26/2009  . HYPERTENSION 03/31/2007  . CAD (coronary artery disease) 03/31/2007  . ASTHMA 08/26/2009  . Gout   . SBO (small bowel obstruction) 2011    lap lysis of adhesions & hernia repair  . Hx of CABG     2005  . Ejection fraction     Normal EF echo 2010  //   EF 60%, catheterization, April, 2014  . Anginal pain   . Shortness of breath   . Diabetes mellitus   . DIABETES MELLITUS, TYPE II 03/31/2007  . Heart  murmur   . PAD (peripheral artery disease)     Dr. Kellie Simmering  . Carotid artery occlusion   . RENAL FAILURE 03/31/2007    NOT ON HD ( PCP ONLY DR Rory Percy)  . Chronic kidney disease (CKD), stage III (moderate)   . Arthritis     GOUT  . Carotid artery disease   . Umbilical hernia   . Thrombocytopenia, unspecified 10/28/2013  . Anemia   . CAD (coronary artery disease)   . Myocardial infarction [facility])     Social History Social History  Substance Use Topics  . Smoking status: Former Smoker -- 1.00 packs/day for 20 years    Types: Cigarettes    Quit date: 12/10/2012  . Smokeless tobacco: Never Used  . Alcohol Use: No    Family History Family History  Problem Relation Age of Onset  . Diabetes Mother   . Heart disease Mother     before age 57  . Hyperlipidemia Mother   . Hypertension Mother   . Thyroid disease Father   . Hypertension Father   . AAA (abdominal aortic aneurysm) Father   . Heart disease Brother     before age 69  . Hypertension Brother   . Hyperlipidemia Son   . Hypertension Son     Surgical History Past Surgical History  Procedure Laterality Date  . Cholecystectomy  2010  . Hernia repair  1989    open w mesh  . Cesarean section  1984  .  Cardiac catheterization  07/18/2004  . Cardiac stents  2006-2007  . Incisional hernia repair x2  03/04/2010     laparoscopic with 35cm mesh by Dr Ronnald Collum  . Shoulder surgery    . Coronary artery bypass graft  2005  . Coronary angioplasty with stent placement  12/19/2012    [facility]    DES  . Endarterectomy Right 04/18/2013    Procedure: ENDARTERECTOMY CAROTID;  Surgeon: Mal Misty, MD;  Location: [facility];  Service: Vascular;  Laterality: Right;  . Patch angioplasty Right 04/18/2013    Procedure: PATCH ANGIOPLASTY Right Internal Carotid Artery;  Surgeon: Mal Misty, MD;  Location: [facility];  Service: Vascular;  Laterality: Right;  . Left heart catheterization with coronary angiogram N/A 12/19/2012    Procedure: LEFT  HEART CATHETERIZATION WITH CORONARY ANGIOGRAM;  Surgeon: Josue Hector, MD;  Location: [facility] CATH LAB;  Service: Cardiovascular;  Laterality: N/A;  . Percutaneous coronary stent intervention (pci-s) Right 12/19/2012    Procedure: PERCUTANEOUS CORONARY STENT INTERVENTION (PCI-S);  Surgeon: Josue Hector, MD;  Location: [facility] CATH LAB;  Service: Cardiovascular;  Laterality: Right;  . Left heart catheterization with coronary/graft angiogram N/A 04/19/2013    Procedure: LEFT HEART CATHETERIZATION WITH Beatrix Fetters;  Surgeon: Lorretta Harp, MD;  Location: [facility] C[facility] LAB;  Service: Cardiovascular;  Laterality: N/A;    Allergies  Allergen Reactions  . Penicillins Other (See Comments)    REACTION: Unknown, told as a child    Current Outpatient Prescriptions  Medication Sig Dispense Refill  . aspirin EC 81 MG tablet Take 81 mg by mouth daily.    Marland Kitchen atorvastatin (LIPITOR) 40 MG tablet Take 1 tablet (40 mg total) by mouth daily. 90 tablet 3  . clopidogrel (PLAVIX) 75 MG tablet Take 1 tablet (75 mg total) by mouth daily. 30 tablet 11  . clotrimazole-betamethasone (LOTRISONE) cream Apply topically 3 (three) times daily. As needed for rash 45 g 2  . colchicine 0.6 MG tablet Take 0.6 mg by mouth daily.     . furosemide (LASIX) 40 MG tablet Take 1 tablet (40 mg total) by mouth daily. Restart in 3 days 30 tablet   . gabapentin (NEURONTIN) 400 MG capsule Take 400 mg by mouth at bedtime.    . insulin NPH Human (HUMULIN N,NOVOLIN N) 100 UNIT/ML injection Inject into the skin every morning. 44 units in the am and 44 units in the pm    . isosorbide dinitrate (ISORDIL) 20 MG tablet Take 1 tablet (20 mg total) by mouth daily. 30 tablet 2  . levothyroxine (SYNTHROID, LEVOTHROID) 125 MCG tablet Take 125 mcg by mouth every morning.    . nitroGLYCERIN (NITROSTAT) 0.4 MG SL tablet Place 0.4 mg under the tongue every 5 (five) minutes as needed for chest pain. Not to exceed 3 in 15 minute time frame    .  oxyCODONE-acetaminophen (PERCOCET) 10-325 MG per tablet Take 1 tablet by mouth every 6 (six) hours as needed for pain. 30 tablet 0   No current facility-administered medications for this visit.    Review of Systems : See HPI for pertinent positives and negatives.  Physical Examination  Filed Vitals:   08/04/15 0854 08/04/15 0857 08/04/15 0904 08/04/15 0907  BP: 198/94 179/85 157/81 140/90  Pulse: 67 63 61 60  Temp:  97.5 F (36.4 C)    TempSrc:  Oral    Resp:  16    Height:  5\' 5"  (1.651 m)    Weight:  229 lb (103.874 kg)    SpO2:  100%     Body mass index is 38.11 kg/(m^2).  General: WDWN obese female in NAD GAIT: normal Eyes: PERRLA Pulmonary:  Non-labored, CTAB, no rales, no rhonchi, & no wheezing.  Cardiac: regular rhythm,  no detected murmur.  VASCULAR EXAM Carotid Bruits Right Left   Negative Negative    Aorta is not palpable. Radial pulses are 1+ palpable right and 2+ palpable left.                                                                                                                            LE Pulses Right Left       POPLITEAL  not palpable   not palpable       POSTERIOR TIBIAL   palpable    palpable        DORSALIS PEDIS      ANTERIOR TIBIAL  palpable   palpable     Gastrointestinal: softly obese, RLQ tender to palpation, large right LQ hernia,.  Musculoskeletal: no muscle atrophy/wasting. M/S 5/5 throughout, extremities without ischemic changes.  Neurologic: A&O X 3; Appropriate Affect, Speech is normal CN 2-12 intact, pain and light touch intact in extremities, Motor exam as listed above.   Non-Invasive Vascular Imaging CAROTID DUPLEX 08/04/2015   CEREBROVASCULAR DUPLEX EVALUATION    INDICATION: Carotid artery disease     PREVIOUS INTERVENTION(S): Right carotid endarterectomy 04/18/2013.    DUPLEX EXAM:     RIGHT  LEFT  Peak Systolic Velocities (cm/s) End Diastolic Velocities (cm/s) Plaque LOCATION Peak Systolic Velocities  (cm/s) End Diastolic Velocities (cm/s) Plaque  85 14  CCA PROXIMAL 84 12   83 19  CCA MID 88 15   52 16  CCA DISTAL 58 15 HT  119 6  ECA 56 6   52 15  ICA PROXIMAL 71 17 HT  136 57  ICA MID 86 35   131 49  ICA DISTAL 99 36     NA ICA / CCA Ratio (PSV) 1.1  Antegrade  Vertebral Flow Antegrade    Brachial Systolic Pressure (mmHg)   Multiphasic (Subclavian artery) Brachial Artery Waveforms Multiphasic (Subclavian artery)    Plaque Morphology:  HM = Homogeneous, HT = Heterogeneous, CP = Calcific Plaque, SP = Smooth Plaque, IP = Irregular Plaque  ADDITIONAL FINDINGS:     IMPRESSION: Patent right carotid endarterectomy site with a mildly elevated velocity of the right mid internal carotid artery which appears to be due to a change in vessel diameter since no plaque formation was adequately visualized.  Left internal carotid artery velocities suggest a <40% stenosis.     Compared to the previous exam:  No significant change in comparison to the last exam on 07/14/2014.      Assessment: CAIYAH LOVEN is a 59 y.o. female who is s/p right carotid endarterectomy in August 2014 for asymptomatic severe stenosis.  Today's carotid duplex suggests a patent right carotid  endarterectomy site with a mildly elevated velocity of the right mid internal carotid artery which appears to be due to a change in vessel diameter since no plaque formation was adequately visualized.  Left internal carotid artery velocities suggest a <40% stenosis. No significant change in comparison to the last exam on 07/14/2014.    Plan:  Follow-up in 1 year with Carotid Duplex scan.   I discussed in depth with the patient the nature of atherosclerosis, and emphasized the importance of maximal medical management including strict control of blood pressure, blood glucose, and lipid levels, obtaining regular exercise, and continued cessation of smoking.  The patient is aware that without maximal medical management the  underlying atherosclerotic disease process will progress, limiting the benefit of any interventions. The patient was given information about stroke prevention and what symptoms should prompt the patient to seek immediate medical care. Thank you for allowing [place] to participate in this patient's care.  Clemon Chambers, RN, MSN, FNP-C [facility] Office: [phone]  Clinic Physician: Scot Dock  08/04/2015 8:52 AM

## 2015-08-04 NOTE — Patient Instructions (Signed)
Stroke Prevention Some medical conditions and behaviors are associated with an increased chance of having a stroke. You may prevent a stroke by making healthy choices and managing medical conditions. HOW CAN I REDUCE MY RISK OF HAVING A STROKE?   Stay physically active. Get at least 30 minutes of activity on most or all days.  Do not smoke. It may also be helpful to avoid exposure to secondhand smoke.  Limit alcohol use. Moderate alcohol use is considered to be:  No more than 2 drinks per day for men.  No more than 1 drink per day for nonpregnant women.  Eat healthy foods. This involves:  Eating 5 or more servings of fruits and vegetables a day.  Making dietary changes that address high blood pressure (hypertension), high cholesterol, diabetes, or obesity.  Manage your cholesterol levels.  Making food choices that are high in fiber and low in saturated fat, trans fat, and cholesterol may control cholesterol levels.  Take any prescribed medicines to control cholesterol as directed by your health care provider.  Manage your diabetes.  Controlling your carbohydrate and sugar intake is recommended to manage diabetes.  Take any prescribed medicines to control diabetes as directed by your health care provider.  Control your hypertension.  Making food choices that are low in salt (sodium), saturated fat, trans fat, and cholesterol is recommended to manage hypertension.  Ask your health care provider if you need treatment to lower your blood pressure. Take any prescribed medicines to control hypertension as directed by your health care provider.  If you are 18-39 years of age, have your blood pressure checked every 3-5 years. If you are 40 years of age or older, have your blood pressure checked every year.  Maintain a healthy weight.  Reducing calorie intake and making food choices that are low in sodium, saturated fat, trans fat, and cholesterol are recommended to manage  weight.  Stop drug abuse.  Avoid taking birth control pills.  Talk to your health care provider about the risks of taking birth control pills if you are over 35 years old, smoke, get migraines, or have ever had a blood clot.  Get evaluated for sleep disorders (sleep apnea).  Talk to your health care provider about getting a sleep evaluation if you snore a lot or have excessive sleepiness.  Take medicines only as directed by your health care provider.  For some people, aspirin or blood thinners (anticoagulants) are helpful in reducing the risk of forming abnormal blood clots that can lead to stroke. If you have the irregular heart rhythm of atrial fibrillation, you should be on a blood thinner unless there is a good reason you cannot take them.  Understand all your medicine instructions.  Make sure that other conditions (such as anemia or atherosclerosis) are addressed. SEEK IMMEDIATE MEDICAL CARE IF:   You have sudden weakness or numbness of the face, arm, or leg, especially on one side of the body.  Your face or eyelid droops to one side.  You have sudden confusion.  You have trouble speaking (aphasia) or understanding.  You have sudden trouble seeing in one or both eyes.  You have sudden trouble walking.  You have dizziness.  You have a loss of balance or coordination.  You have a sudden, severe headache with no known cause.  You have new chest pain or an irregular heartbeat. Any of these symptoms may represent a serious problem that is an emergency. Do not wait to see if the symptoms will   go away. Get medical help at once. Call your local emergency services (911 in [place] Do not drive yourself to the hospital.   This information is not intended to replace advice given to you by your health care provider. Make sure you discuss any questions you have with your health care provider.   Document Released: 10/12/2004 Document Revised: 09/25/2014 Document Reviewed:  03/07/2013 Elsevier Interactive Patient Education 2016 [employer]  

## 2015-08-13 ENCOUNTER — Emergency Department (HOSPITAL_COMMUNITY)
Admission: EM | Admit: 2015-08-13 | Discharge: 2015-08-14 | Disposition: A | Discharge status: Home / Self Care | Payer: Medicare Other | Attending: Emergency Medicine | Admitting: Emergency Medicine

## 2015-08-13 ENCOUNTER — Emergency Department (HOSPITAL_COMMUNITY): Payer: Medicare Other

## 2015-08-13 ENCOUNTER — Encounter (HOSPITAL_COMMUNITY): Payer: Self-pay | Admitting: Emergency Medicine

## 2015-08-13 DIAGNOSIS — E785 Hyperlipidemia, unspecified: Secondary | ICD-10-CM | POA: Insufficient documentation

## 2015-08-13 DIAGNOSIS — J45909 Unspecified asthma, uncomplicated: Secondary | ICD-10-CM | POA: Diagnosis not present

## 2015-08-13 DIAGNOSIS — M1990 Unspecified osteoarthritis, unspecified site: Secondary | ICD-10-CM | POA: Diagnosis not present

## 2015-08-13 DIAGNOSIS — R1031 Right lower quadrant pain: Secondary | ICD-10-CM | POA: Diagnosis not present

## 2015-08-13 DIAGNOSIS — S31109A Unspecified open wound of abdominal wall, unspecified quadrant without penetration into peritoneal cavity, initial encounter: Secondary | ICD-10-CM

## 2015-08-13 DIAGNOSIS — E119 Type 2 diabetes mellitus without complications: Secondary | ICD-10-CM | POA: Insufficient documentation

## 2015-08-13 DIAGNOSIS — Z862 Personal history of diseases of the blood and blood-forming organs and certain disorders involving the immune mechanism: Secondary | ICD-10-CM | POA: Insufficient documentation

## 2015-08-13 DIAGNOSIS — Z880 Allergy status to penicillin: Secondary | ICD-10-CM | POA: Insufficient documentation

## 2015-08-13 DIAGNOSIS — R109 Unspecified abdominal pain: Secondary | ICD-10-CM | POA: Diagnosis present

## 2015-08-13 DIAGNOSIS — R011 Cardiac murmur, unspecified: Secondary | ICD-10-CM | POA: Insufficient documentation

## 2015-08-13 DIAGNOSIS — Z7902 Long term (current) use of antithrombotics/antiplatelets: Secondary | ICD-10-CM | POA: Diagnosis not present

## 2015-08-13 DIAGNOSIS — E7800 Pure hypercholesterolemia, unspecified: Secondary | ICD-10-CM | POA: Diagnosis not present

## 2015-08-13 DIAGNOSIS — M791 Myalgia: Secondary | ICD-10-CM | POA: Insufficient documentation

## 2015-08-13 DIAGNOSIS — Z87891 Personal history of nicotine dependence: Secondary | ICD-10-CM | POA: Diagnosis not present

## 2015-08-13 DIAGNOSIS — E663 Overweight: Secondary | ICD-10-CM | POA: Insufficient documentation

## 2015-08-13 DIAGNOSIS — M109 Gout, unspecified: Secondary | ICD-10-CM | POA: Insufficient documentation

## 2015-08-13 DIAGNOSIS — L089 Local infection of the skin and subcutaneous tissue, unspecified: Secondary | ICD-10-CM | POA: Insufficient documentation

## 2015-08-13 DIAGNOSIS — K4090 Unilateral inguinal hernia, without obstruction or gangrene, not specified as recurrent: Secondary | ICD-10-CM | POA: Diagnosis not present

## 2015-08-13 DIAGNOSIS — Z9861 Coronary angioplasty status: Secondary | ICD-10-CM | POA: Insufficient documentation

## 2015-08-13 DIAGNOSIS — Z951 Presence of aortocoronary bypass graft: Secondary | ICD-10-CM | POA: Insufficient documentation

## 2015-08-13 DIAGNOSIS — R110 Nausea: Secondary | ICD-10-CM | POA: Insufficient documentation

## 2015-08-13 DIAGNOSIS — I129 Hypertensive chronic kidney disease with stage 1 through stage 4 chronic kidney disease, or unspecified chronic kidney disease: Secondary | ICD-10-CM | POA: Insufficient documentation

## 2015-08-13 DIAGNOSIS — I252 Old myocardial infarction: Secondary | ICD-10-CM | POA: Diagnosis not present

## 2015-08-13 DIAGNOSIS — I2510 Atherosclerotic heart disease of native coronary artery without angina pectoris: Secondary | ICD-10-CM | POA: Insufficient documentation

## 2015-08-13 DIAGNOSIS — Z7982 Long term (current) use of aspirin: Secondary | ICD-10-CM | POA: Insufficient documentation

## 2015-08-13 DIAGNOSIS — N183 Chronic kidney disease, stage 3 (moderate): Secondary | ICD-10-CM | POA: Diagnosis not present

## 2015-08-13 DIAGNOSIS — R1030 Lower abdominal pain, unspecified: Secondary | ICD-10-CM

## 2015-08-13 LAB — COMPREHENSIVE METABOLIC PANEL
ALBUMIN: 3.6 g/dL (ref 3.5–5.0)
ALT: 20 U/L (ref 14–54)
AST: 20 U/L (ref 15–41)
Alkaline Phosphatase: 102 U/L (ref 38–126)
Anion gap: 10 (ref 5–15)
BILIRUBIN TOTAL: 0.5 mg/dL (ref 0.3–1.2)
BUN: 27 mg/dL — AB (ref 6–20)
CALCIUM: 9.7 mg/dL (ref 8.9–10.3)
CHLORIDE: 102 mmol/L (ref 101–111)
CO2: 30 mmol/L (ref 22–32)
Creatinine, Ser: 1.55 mg/dL — ABNORMAL HIGH (ref 0.44–1.00)
GFR calc Af Amer: 42 mL/min — ABNORMAL LOW (ref >60)
GFR calc non Af Amer: 36 mL/min — ABNORMAL LOW (ref >60)
GLUCOSE: 126 mg/dL — AB (ref 65–99)
Potassium: 3.9 mmol/L (ref 3.5–5.1)
Sodium: 142 mmol/L (ref 135–145)
TOTAL PROTEIN: 7.6 g/dL (ref 6.5–8.1)

## 2015-08-13 LAB — CBC WITH DIFFERENTIAL/PLATELET
Basophils Absolute: 0 10*3/uL (ref 0.0–0.1)
Basophils Relative: 0 %
Eosinophils Absolute: 0.1 10*3/uL (ref 0.0–0.7)
Eosinophils Relative: 2 %
HEMATOCRIT: 42.3 % (ref 36.0–46.0)
Hemoglobin: 14.6 g/dL (ref 12.0–15.0)
Lymphocytes Relative: 30 %
Lymphs Abs: 2.7 10*3/uL (ref 0.7–4.0)
MCH: 30.9 pg (ref 26.0–34.0)
MCHC: 34.5 g/dL (ref 30.0–36.0)
MCV: 89.6 fL (ref 78.0–100.0)
MONO ABS: 1 10*3/uL (ref 0.1–1.0)
Monocytes Relative: 12 %
Neutro Abs: 5 10*3/uL (ref 1.7–7.7)
Neutrophils Relative %: 56 %
PLATELETS: 184 10*3/uL (ref 150–400)
RBC: 4.72 MIL/uL (ref 3.87–5.11)
RDW: 12.8 % (ref 11.5–15.5)
WBC: 8.8 10*3/uL (ref 4.0–10.5)

## 2015-08-13 LAB — URINE MICROSCOPIC-ADD ON: RBC / HPF: NONE SEEN RBC/hpf (ref 0–5)

## 2015-08-13 LAB — URINALYSIS, ROUTINE W REFLEX MICROSCOPIC
BILIRUBIN URINE: NEGATIVE
Glucose, UA: NEGATIVE mg/dL
HGB URINE DIPSTICK: NEGATIVE
Ketones, ur: NEGATIVE mg/dL
Nitrite: NEGATIVE
Protein, ur: 100 mg/dL — AB
Specific Gravity, Urine: 1.015 (ref 1.005–1.030)
pH: 6 (ref 5.0–8.0)

## 2015-08-13 LAB — LIPASE, BLOOD: Lipase: 37 U/L (ref 11–51)

## 2015-08-13 LAB — LACTIC ACID, PLASMA: Lactic Acid, Venous: 1 mmol/L (ref 0.5–2.0)

## 2015-08-13 MED ORDER — ONDANSETRON HCL 4 MG/2ML IJ SOLN
4.0000 mg | Freq: Once | INTRAMUSCULAR | Status: AC
  Administered 2015-08-13: 4 mg via INTRAVENOUS
  Filled 2015-08-13: qty 2

## 2015-08-13 MED ORDER — BARIUM SULFATE 2 % PO SUSP: 450.0000 mL | Freq: Once | ORAL | Status: DC

## 2015-08-13 MED ORDER — IOHEXOL 300 MG/ML  SOLN
100.0000 mL | Freq: Once | INTRAMUSCULAR | Status: AC | PRN
  Administered 2015-08-13: 100 mL via INTRAVENOUS

## 2015-08-13 MED ORDER — IOHEXOL 300 MG/ML  SOLN: 100.0000 mL | Freq: Once | INTRAMUSCULAR | Status: DC | PRN

## 2015-08-13 NOTE — ED Provider Notes (Signed)
CSN: [id number]     Arrival date & time 08/13/15  1759 History   First MD Initiated Contact with Patient 08/13/15 2009     Chief Complaint  Patient presents with  . Abdominal Pain     (Consider location/radiation/quality/duration/timing/severity/associated sxs/prior Treatment) The history is provided by the patient.   Donna Howe is a 59 y.o. female with multiple medical past history as outlined below, most significant for diabetes and a known right lower abdominal hernia (pending surgery by her surgeon in [place]) along with a nonhealing skin infection at the site of this hernia (which has been present for months) and undergoing wound care by her pcp presenting with a several-day history of increased fatigue, feeling unwell (flu -like with body aches) with low-grade fever and nausea. The wound site appears more inflammed and red than normal, but otherwise stable as far as size.  Her fevers have been low grade - 99.5 -100.0 range.  She denies vomiting, diarrhea, dysuria, has no chest pain, does endorse occasional nonproductive cough without shortness of breath.  She endorses her blood sugars have been more elevated than normal today, last checked was 170 which is due to drinking sips of Coca-Cola as is the only thing that has calmed her nausea. She has found no alleviators for her symptoms.     Past Medical History  Diagnosis Date  . Hypercholesteremia   . Hypertension   . HYPERLIPIDEMIA 03/31/2007  . Overweight(278.02) 08/26/2009  . HYPERTENSION 03/31/2007  . CAD (coronary artery disease) 03/31/2007  . ASTHMA 08/26/2009  . Gout   . SBO (small bowel obstruction) ([place]) 2011    lap lysis of adhesions & hernia repair  . Hx of CABG     2005  . Ejection fraction     Normal EF echo 2010  //   EF 60%, catheterization, April, 2014  . Anginal pain ([place])   . Shortness of breath   . Diabetes mellitus   . DIABETES MELLITUS, TYPE II 03/31/2007  . Heart murmur   . PAD (peripheral artery  disease) ([place])     Dr. Kellie Simmering  . Carotid artery occlusion   . RENAL FAILURE 03/31/2007    NOT ON HD ( PCP ONLY DR Rory Percy)  . Chronic kidney disease (CKD), stage III (moderate)   . Arthritis     GOUT  . Carotid artery disease ([place])   . Umbilical hernia   . Thrombocytopenia, unspecified ([place]) 10/28/2013  . Anemia   . CAD (coronary artery disease)   . Myocardial infarction [facility])    Past Surgical History  Procedure Laterality Date  . Cholecystectomy  2010  . Hernia repair  1989    open w mesh  . Cesarean section  1984  . Cardiac catheterization  07/18/2004  . Cardiac stents  2006-2007  . Incisional hernia repair x2  03/04/2010     laparoscopic with 35cm mesh by Dr Ronnald Collum  . Shoulder surgery    . Coronary artery bypass graft  2005  . Coronary angioplasty with stent placement  12/19/2012    [facility]    DES  . Endarterectomy Right 04/18/2013    Procedure: ENDARTERECTOMY CAROTID;  Surgeon: Mal Misty, MD;  Location: [place];  Service: Vascular;  Laterality: Right;  . Patch angioplasty Right 04/18/2013    Procedure: PATCH ANGIOPLASTY Right Internal Carotid Artery;  Surgeon: Mal Misty, MD;  Location: [place];  Service: Vascular;  Laterality: Right;  . Left heart catheterization with coronary  angiogram N/A 12/19/2012    Procedure: LEFT HEART CATHETERIZATION WITH CORONARY ANGIOGRAM;  Surgeon: Josue Hector, MD;  Location: [facility] CATH LAB;  Service: Cardiovascular;  Laterality: N/A;  . Percutaneous coronary stent intervention (pci-s) Right 12/19/2012    Procedure: PERCUTANEOUS CORONARY STENT INTERVENTION (PCI-S);  Surgeon: Josue Hector, MD;  Location: [facility] CATH LAB;  Service: Cardiovascular;  Laterality: Right;  . Left heart catheterization with coronary/graft angiogram N/A 04/19/2013    Procedure: LEFT HEART CATHETERIZATION WITH Beatrix Fetters;  Surgeon: Lorretta Harp, MD;  Location: [facility] CATH LAB;  Service: Cardiovascular;  Laterality: N/A;   Family History  Problem Relation  Age of Onset  . Diabetes Mother   . Heart disease Mother     before age 71  . Hyperlipidemia Mother   . Hypertension Mother   . Thyroid disease Father   . Hypertension Father   . AAA (abdominal aortic aneurysm) Father   . Heart disease Brother     before age 62  . Hypertension Brother   . Hyperlipidemia Son   . Hypertension Son    Social History  Substance Use Topics  . Smoking status: Former Smoker -- 1.00 packs/day for 20 years    Types: Cigarettes    Quit date: 12/10/2012  . Smokeless tobacco: Never Used  . Alcohol Use: No   OB History    No data available     Review of Systems  Constitutional: Positive for fever.  HENT: Negative for congestion and sore throat.   Eyes: Negative.   Respiratory: Negative for chest tightness and shortness of breath.   Cardiovascular: Negative for chest pain.  Gastrointestinal: Positive for nausea and abdominal pain. Negative for vomiting, diarrhea and constipation.  Genitourinary: Negative.  Negative for dysuria and hematuria.  Musculoskeletal: Positive for myalgias. Negative for joint swelling, arthralgias and neck pain.  Skin: Positive for wound. Negative for rash.  Neurological: Negative for dizziness, weakness, light-headedness, numbness and headaches.  Psychiatric/Behavioral: Negative.       Allergies  Penicillins  Home Medications   Prior to Admission medications   Medication Sig Start Date End Date Taking? Authorizing Provider  aspirin EC 81 MG tablet Take 81 mg by mouth daily.   Yes Historical Provider, MD  atorvastatin (LIPITOR) 40 MG tablet Take 1 tablet (40 mg total) by mouth daily. 08/25/14  Yes Carlena Bjornstad, MD  clopidogrel (PLAVIX) 75 MG tablet Take 1 tablet (75 mg total) by mouth daily. 12/20/12  Yes Rhonda G Barrett, PA-C  colchicine 0.6 MG tablet Take 0.6 mg by mouth daily.    Yes Historical Provider, MD  furosemide (LASIX) 40 MG tablet Take 1 tablet (40 mg total) by mouth daily. Restart in 3 days Patient taking  differently: Take 40 mg by mouth daily.  10/29/13  Yes Kathie Dike, MD  gabapentin (NEURONTIN) 400 MG capsule Take 400 mg by mouth at bedtime.   Yes Rory Percy, MD  isosorbide dinitrate (ISORDIL) 20 MG tablet Take 1 tablet (20 mg total) by mouth daily. 10/26/14  Yes Carlena Bjornstad, MD  levothyroxine (SYNTHROID, LEVOTHROID) 125 MCG tablet Take 125 mcg by mouth every morning.   Yes Historical Provider, MD  nitroGLYCERIN (NITROSTAT) 0.4 MG SL tablet Place 0.4 mg under the tongue every 5 (five) minutes as needed for chest pain. Not to exceed 3 in 15 minute time frame   Yes Historical Provider, MD  oxyCODONE-acetaminophen (PERCOCET) 10-325 MG per tablet Take 1 tablet by mouth every 6 (six) hours as needed for  pain. 04/18/13  Yes Samantha J Rhyne, PA-C  doxycycline (VIBRAMYCIN) 100 MG capsule Take 1 capsule (100 mg total) by mouth 2 (two) times daily. 08/14/15   Evalee Jefferson, PA-C  ondansetron (ZOFRAN ODT) 8 MG disintegrating tablet Take 1 tablet (8 mg total) by mouth every 8 (eight) hours as needed for nausea or vomiting. 08/14/15   Evalee Jefferson, PA-C   BP 141/58 mmHg  Pulse 62  Temp(Src) 98 F (36.7 C) (Oral)  Resp 18  Ht 5\' 6"  (1.676 m)  Wt 103.874 kg  BMI 36.98 kg/m2  SpO2 97% Physical Exam  Constitutional: She appears well-developed and well-nourished.  HENT:  Head: Normocephalic and atraumatic.  Eyes: Conjunctivae are normal.  Neck: Normal range of motion.  Cardiovascular: Normal rate, regular rhythm, normal heart sounds and intact distal pulses.   Pulmonary/Chest: Effort normal and breath sounds normal. She has no wheezes.  Abdominal: Soft. Bowel sounds are normal. She exhibits distension. She exhibits no mass. There is tenderness in the right lower quadrant. There is no rigidity, no rebound and no guarding. A hernia is present. Hernia confirmed positive in the ventral area.  Large ventral hernia rlq, soft, no guarding, mild tenderness.  There are 2 small superficial ulcerations at the site  of this hernia, each approx 0.5 cm with surrounding erythema, purulence at the base of one, the other has pink granulation tissue.  No induration, no fluctuant masses.  Musculoskeletal: Normal range of motion.  Neurological: She is alert.  Skin: Skin is warm and dry.  Psychiatric: She has a normal mood and affect.  Nursing note and vitals reviewed.   ED Course  Procedures (including critical care time) Labs Review Labs Reviewed  COMPREHENSIVE METABOLIC PANEL - Abnormal; Notable for the following:    Glucose, Bld 126 (*)    BUN 27 (*)    Creatinine, Ser 1.55 (*)    GFR calc non Af Amer 36 (*)    GFR calc Af Amer 42 (*)    All other components within normal limits  URINALYSIS, ROUTINE W REFLEX MICROSCOPIC (NOT AT [facility]) - Abnormal; Notable for the following:    APPearance HAZY (*)    Protein, ur 100 (*)    Leukocytes, UA TRACE (*)    All other components within normal limits  URINE MICROSCOPIC-ADD ON - Abnormal; Notable for the following:    Squamous Epithelial / LPF TOO NUMEROUS TO COUNT (*)    Bacteria, UA MANY (*)    All other components within normal limits  CBC WITH DIFFERENTIAL/PLATELET  LACTIC ACID, PLASMA  LIPASE, BLOOD  TROPONIN I    Imaging Review Ct Abdomen Pelvis W Contrast  08/13/2015  CLINICAL DATA:  Left lower quadrant pain, fever EXAM: CT ABDOMEN AND PELVIS WITH CONTRAST TECHNIQUE: Multidetector CT imaging of the abdomen and pelvis was performed using the standard protocol following bolus administration of intravenous contrast. CONTRAST:  165mL OMNIPAQUE IOHEXOL 300 MG/ML  SOLN COMPARISON:  06/26/2012 FINDINGS: Lung bases are unremarkable. Sagittal images of the spine shows degenerative changes lumbar spine. Again noted bilateral pars defect at L5 level. Stable mild anterolisthesis L5 on S1 vertebral body. Again noted large anterior abdominal wall hernia containing omental fat small bowel and colon. There is no evidence of small bowel obstruction. No pericecal  inflammation. Normal appendix is noted within hernia in axial image 91. The terminal ileum is unremarkable. There is no distal colonic obstruction. Moderate colonic gas is noted in mid sigmoid colon. Some colonic stool is noted in distal sigmoid  colon. Again noted diastasis of the anterior abdominal wall musculature. The uterus and ovaries are unremarkable. Enhanced liver shows no biliary ductal dilatation. No for focal hepatic mass. The patient is status postcholecystectomy. Enhanced pancreas, spleen and adrenal glands are unremarkable. Enhanced kidneys are symmetrical in size. A cyst in lower pole of the right kidney measures 3.8 cm. No hydronephrosis or hydroureter. The urinary bladder is unremarkable. Degenerative changes pubic symphysis. Delayed renal images shows bilateral renal symmetrical excretion. Bilateral visualized proximal ureter is unremarkable. IMPRESSION: 1. Again noted diastasis of the abdominal wall musculature. Large anterior abdominal ventral hernia containing omental fat, small bowel and colon again noted without evidence of acute complication. There is no evidence of small bowel or colonic obstruction. 2. No pericecal inflammation. Normal appendix is noted within hernia. The terminal ileum is unremarkable. 3. No hydronephrosis or hydroureter.  Stable right renal cysts. 4. Unremarkable uterus and adnexal. 5. Degenerative changes lumbar spine. Again noted bilateral pars defect at L5 level. Stable anterolisthesis L5 on S1 vertebral body. Electronically Signed   By: Lahoma Crocker M.D.   On: 08/13/2015 23:56   I have personally reviewed and evaluated these images and lab results as part of my medical decision-making.   EKG Interpretation   Date/Time:  Saturday August 14 2015 00:45:54 EST Ventricular Rate:  57 PR Interval:  180 QRS Duration: 111 QT Interval:  462 QTC Calculation: 450 R Axis:   76 Text Interpretation:  Sinus rhythm Nonspecific T abnormalities, lateral  leads When  compared with ECG of 10/26/2013, No significant change was found  Confirmed by [facility]  MD, DAVID ([id number]) on 08/14/2015 12:52:17 AM      MDM   Final diagnoses:  Lower abdominal pain  Nausea  Chronic wound infection of abdomen, initial encounter ([place])    Patients  labs reviewed.  Radiological studies were viewed, interpreted and considered during the medical decision making and disposition process. I agree with radiologists reading.  Results were also discussed with patient.  She was given IV fluids to help with dehydration, zofran given with improvement in nausea, tolerated PO intake.  CT scan without acute findings, no appendicitis, no intra abdominal or abdominal wall abscess. She was placed on doxycycline for abdominal wound infection, zofran prescribed for nausea.  Pt advised recheck here or by pcp if sx persist or worsen.  F/u with surgeon prn for hernia issue.     Evalee Jefferson, PA-C 08/14/15 0145  Ezequiel Essex, MD 08/14/15 [phone]

## 2015-08-13 NOTE — ED Notes (Signed)
Pt states that she has been having abd pain with low fever.  States that she has a wound on her abdomen that looks redder than normal.

## 2015-08-14 LAB — TROPONIN I: Troponin I: <0.03 ng/mL (ref <0.031)

## 2015-08-14 MED ORDER — DOXYCYCLINE HYCLATE 100 MG PO CAPS: 100.0000 mg | ORAL_CAPSULE | Freq: Two times a day (BID) | ORAL | Status: DC

## 2015-08-14 MED ORDER — ONDANSETRON 8 MG PO TBDP: 8.0000 mg | ORAL_TABLET | Freq: Three times a day (TID) | ORAL | Status: DC | PRN

## 2015-08-14 NOTE — Discharge Instructions (Signed)
Nausea, Adult °Nausea is the feeling that you have an upset stomach or have to vomit. Nausea by itself is not likely a serious concern, but it may be an early sign of more serious medical problems. As nausea gets worse, it can lead to vomiting. If vomiting develops, there is the risk of dehydration.  °CAUSES  °· Viral infections. °· Food poisoning. °· Medicines. °· Pregnancy. °· Motion sickness. °· Migraine headaches. °· Emotional distress. °· Severe pain from any source. °· Alcohol intoxication. °HOME CARE INSTRUCTIONS °· Get plenty of rest. °· Ask your caregiver about specific rehydration instructions. °· Eat small amounts of food and sip liquids more often. °· Take all medicines as told by your caregiver. °SEEK MEDICAL CARE IF: °· You have not improved after 2 days, or you get worse. °· You have a headache. °SEEK IMMEDIATE MEDICAL CARE IF:  °· You have a fever. °· You faint. °· You keep vomiting or have blood in your vomit. °· You are extremely weak or dehydrated. °· You have dark or bloody stools. °· You have severe chest or abdominal pain. °MAKE SURE YOU: °· Understand these instructions. °· Will watch your condition. °· Will get help right away if you are not doing well or get worse. °  °This information is not intended to replace advice given to you by your health care provider. Make sure you discuss any questions you have with your health care provider. °  °Document Released: 10/12/2004 Document Revised: 09/25/2014 Document Reviewed: 05/17/2011 °Elsevier Interactive Patient Education ©2016 [employer] ° °

## 2015-09-03 ENCOUNTER — Ambulatory Visit: Payer: Medicare Other | Admitting: Cardiology

## 2015-09-15 ENCOUNTER — Encounter: Payer: Self-pay | Admitting: Cardiology

## 2015-09-16 ENCOUNTER — Encounter: Payer: Self-pay | Admitting: *Deleted

## 2015-09-16 ENCOUNTER — Ambulatory Visit (INDEPENDENT_AMBULATORY_CARE_PROVIDER_SITE_OTHER): Payer: Medicare Other | Admitting: Cardiology

## 2015-09-16 ENCOUNTER — Encounter: Payer: Self-pay | Admitting: Cardiology

## 2015-09-16 VITALS — BP 182/87 | HR 70 | Ht 66.0 in | Wt 222.6 lb

## 2015-09-16 DIAGNOSIS — E785 Hyperlipidemia, unspecified: Secondary | ICD-10-CM

## 2015-09-16 DIAGNOSIS — I2510 Atherosclerotic heart disease of native coronary artery without angina pectoris: Secondary | ICD-10-CM

## 2015-09-16 DIAGNOSIS — Z01810 Encounter for preprocedural cardiovascular examination: Secondary | ICD-10-CM | POA: Diagnosis not present

## 2015-09-16 DIAGNOSIS — I10 Essential (primary) hypertension: Secondary | ICD-10-CM | POA: Diagnosis not present

## 2015-09-16 DIAGNOSIS — I6523 Occlusion and stenosis of bilateral carotid arteries: Secondary | ICD-10-CM

## 2015-09-16 MED ORDER — ATORVASTATIN CALCIUM 40 MG PO TABS: 40.0000 mg | ORAL_TABLET | Freq: Every day | ORAL | Status: DC

## 2015-09-16 NOTE — Patient Instructions (Signed)
Your physician wants you to follow-up in: 6 months with Dr. McDowell You will receive a reminder letter in the mail two months in advance. If you don't receive a letter, please call our office to schedule the follow-up appointment.  Your physician recommends that you continue on your current medications as directed. Please refer to the Current Medication list given to you today.  Thank you for choosing [facility]!!    

## 2015-09-16 NOTE — Progress Notes (Signed)
Cardiology Office Note  Date: 09/16/2015   ID: [name], DOB 08-15-1956, MRN [id number]  PCP: Rory Percy, MD  Primary Cardiologist: Rozann Lesches, MD   Chief Complaint  Patient presents with  . Coronary Artery Disease    History of Present Illness: Donna Howe is a 59 y.o. female former patient of Dr. Ron Parker now establishing follow-up with me. I reviewed extensive records and updated her chart. She last saw Dr. Ron Parker in June.  She presents for a routine follow-up visit. She has not had any significant angina symptoms on medical therapy and reports NYHA class II dyspnea with typical activities. Blood pressure was elevated today, but she states that she has been under stress with recent family issues. She reports systolics generally under the 140s when she checks it.  Last coronary intervention was in 2014 as outlined below. She continues on aspirin and Plavix.  She continues to follow with Dr. Kellie Simmering with history of right CEA in 2014. Most recent carotid Dopplers were in November of this year.  Lipids been followed by Dr. Nadara Mustard. She continues on Lipitor at 40 mmHg grams daily. She states that she had lab work about one month ago which we will request for review.  She is also in the process of undergoing evaluation for repair of a large incisional abdominal hernia. She is seeing a Psychologist, sport and exercise in [place]. At this point operation has not been scheduled.  Past Medical History  Diagnosis Date  . Hyperlipidemia   . Essential hypertension   . CAD (coronary artery disease)     Multivessel s/p CABG, occluded SVG to OM, DES to LM/circ 12/2012  . Asthma   . Gout   . SBO (small bowel obstruction) ([place]) 2011    Status post lysis of adhesions & hernia repair  . Type 2 diabetes mellitus ([place])   . PAD (peripheral artery disease) ([place])     Dr. Kellie Simmering  . Chronic kidney disease (CKD), stage III (moderate)   . Carotid artery disease ([place])   . Umbilical hernia   .  Thrombocytopenia, unspecified ([place])   . Anemia   . Myocardial infarction [facility])     Past Surgical History  Procedure Laterality Date  . Cholecystectomy  2010  . Hernia repair  1989  . Cesarean section  1984  . Incisional hernia repair x2  03/04/2010    Laparoscopic with 35cm mesh by Dr Ronnald Collum  . Shoulder surgery    . Coronary artery bypass graft  2005  . Endarterectomy Right 04/18/2013    Procedure: ENDARTERECTOMY CAROTID;  Surgeon: Mal Misty, MD;  Location: [place];  Service: Vascular;  Laterality: Right;  . Patch angioplasty Right 04/18/2013    Procedure: PATCH ANGIOPLASTY Right Internal Carotid Artery;  Surgeon: Mal Misty, MD;  Location: [place];  Service: Vascular;  Laterality: Right;  . Left heart catheterization with coronary angiogram N/A 12/19/2012    Procedure: LEFT HEART CATHETERIZATION WITH CORONARY ANGIOGRAM;  Surgeon: Josue Hector, MD;  Location: [facility] CATH LAB;  Service: Cardiovascular;  Laterality: N/A;  . Percutaneous coronary stent intervention (pci-s) Right 12/19/2012    Procedure: PERCUTANEOUS CORONARY STENT INTERVENTION (PCI-S);  Surgeon: Josue Hector, MD;  Location: [facility] CATH LAB;  Service: Cardiovascular;  Laterality: Right;  . Left heart catheterization with coronary/graft angiogram N/A 04/19/2013    Procedure: LEFT HEART CATHETERIZATION WITH Beatrix Fetters;  Surgeon: Lorretta Harp, MD;  Location: [facility] CATH LAB;  Service: Cardiovascular;  Laterality: N/A;  Current Outpatient Prescriptions  Medication Sig Dispense Refill  . aspirin EC 81 MG tablet Take 81 mg by mouth daily.    Marland Kitchen atorvastatin (LIPITOR) 40 MG tablet Take 1 tablet (40 mg total) by mouth daily. 90 tablet 3  . clopidogrel (PLAVIX) 75 MG tablet Take 1 tablet (75 mg total) by mouth daily. 30 tablet 11  . colchicine 0.6 MG tablet Take 0.6 mg by mouth daily.     . furosemide (LASIX) 40 MG tablet Take 1 tablet (40 mg total) by mouth daily. Restart in 3 days (Patient taking differently: Take 40  mg by mouth daily. ) 30 tablet   . gabapentin (NEURONTIN) 400 MG capsule Take 400 mg by mouth at bedtime.    . isosorbide dinitrate (ISORDIL) 20 MG tablet Take 1 tablet (20 mg total) by mouth daily. 30 tablet 2  . levothyroxine (SYNTHROID, LEVOTHROID) 125 MCG tablet Take 125 mcg by mouth every morning.    . nitroGLYCERIN (NITROSTAT) 0.4 MG SL tablet Place 0.4 mg under the tongue every 5 (five) minutes as needed for chest pain. Not to exceed 3 in 15 minute time frame    . NOVOLIN 70/30 RELION (70-30) 100 UNIT/ML injection Inject 20 Units into the skin 3 (three) times daily.    Marland Kitchen oxyCODONE-acetaminophen (PERCOCET) 10-325 MG per tablet Take 1 tablet by mouth every 6 (six) hours as needed for pain. 30 tablet 0  . ondansetron (ZOFRAN ODT) 8 MG disintegrating tablet Take 1 tablet (8 mg total) by mouth every 8 (eight) hours as needed for nausea or vomiting. (Patient not taking: Reported on 09/16/2015) 20 tablet 0   No current facility-administered medications for this visit.   Allergies:  Penicillins   Social History: The patient  reports that she quit smoking about 2 years ago. Her smoking use included Cigarettes. She has a 20 pack-year smoking history. She has never used smokeless tobacco. She reports that she does not drink alcohol or use illicit drugs.   ROS:  Please see the history of present illness. Otherwise, complete review of systems is positive for large abdominal hernia.  All other systems are reviewed and negative.   Physical Exam: VS:  BP 182/87 mmHg  Pulse 70  Ht 5\' 6"  (1.676 m)  Wt 222 lb 9.6 oz (100.971 kg)  BMI 35.95 kg/m2  SpO2 97%, BMI Body mass index is 35.95 kg/(m^2).  Wt Readings from Last 3 Encounters:  09/16/15 222 lb 9.6 oz (100.971 kg)  08/13/15 229 lb (103.874 kg)  08/04/15 229 lb (103.874 kg)    General: Overweight woman, appears comfortable at rest. HEENT: Conjunctiva and lids normal, oropharynx clear. Neck: Supple, no elevated JVP, soft right carotid bruit with  CEA scar, no thyromegaly. Lungs: Clear to auscultation, nonlabored breathing at rest. Cardiac: Regular rate and rhythm, no S3, 2/6 systolic murmur, no pericardial rub. Abdomen: Soft, nontender, bowel sounds present. Extremities: No pitting edema, distal pulses 2+. Skin: Warm and dry. Musculoskeletal: No kyphosis. Neuropsychiatric: Alert and oriented x3, affect grossly appropriate.  ECG: Tracing from 08/14/2015 showed sinus rhythm with nonspecific T-wave changes.  Recent Labwork: 08/13/2015: ALT 20; AST 20; BUN 27*; Creatinine, Ser 1.55*; Hemoglobin 14.6; Platelets 184; Potassium 3.9; Sodium 142   Other Studies Reviewed Today:  Echocardiogram 10/28/2013: Study Conclusions  - Left ventricle: The cavity size was normal. Wall thickness was increased in a pattern of mild LVH. Systolic function was vigorous. The estimated ejection fraction was in the range of 65% to 70%. Wall motion was normal; there  were no regional wall motion abnormalities. Features are consistent with a pseudonormal left ventricular filling pattern, with concomitant abnormal relaxation and increased filling pressure (grade 2 diastolic dysfunction). - Aortic valve: Mildly calcified annulus. Trileaflet. Cusp separation was normal. Transvalvular velocity was abnormal, due toincreased cardiac output and relatively small LVOT. No significant regurgitation. Mean gradient: 81mm Hg (S). - Mitral valve: Calcified annulus. Mild regurgitation. - Left atrium: The atrium was mildly dilated. - Right atrium: Central venous pressure: 38mm Hg (est). - Tricuspid valve: Physiologic regurgitation. - Pulmonary arteries: Systolic pressure could not be accurately estimated. - Pericardium, extracardiac: A prominent pericardial fat pad was present. There was no pericardial effusion. Impressions:  - Mild LVH with LVEF [id number], grade 2 diastolic dysfunction with increased filling pressures. Mild left  atrial enlargement. MAC with mild mitral regurgitation. Mildly sclerotic aortic valve, increased transvalvular velocity likely secondary to relatively small LVOT and increased cardiac output, no definite aortic stenosis. Unable to assess PASP.  Assessment and Plan:  1. Multivessel CAD status post CABG with occluded SVG to OM and status post DES to the left main/circumflex in April 2014. She continues on DAPT. For now will continue medical therapy and observation. Her regimen also includes statin therapy and long-acting nitrates.  2. Preoperative evaluation. She states that she is undergoing evaluation with a surgeon in [place] for repair of a large incisional abdominal hernia. This has not yet been scheduled but may well be within the next 6 months. She is nearing 3 years out from her last coronary intervention and we discussed obtaining a follow-up Lexiscan Cardiolite for preoperative assessment once surgery has been scheduled.  3. Essential hypertension, blood pressure is elevated today. She reports compliance with current medical therapy. I have asked her to keep an eye on this with Dr. Nadara Mustard. May need additional medication if trend continues. She is also working on weight loss.  4. Hyperlipidemia, on statin therapy. Requesting most recent lab work from Dr. Nadara Mustard.  5. Carotid artery disease status post right CEA, followed by Dr. Kellie Simmering.  Current medicines were reviewed with the patient today.  Disposition: FU with me in 6 months.   Signed, Satira Sark, MD, [facility] 09/16/2015 1:25 PM    [facility] [address], [place], [place] [address] Phone: [phone]; Fax: [phone]

## 2015-09-29 DIAGNOSIS — L909 Atrophic disorder of skin, unspecified: Secondary | ICD-10-CM | POA: Diagnosis not present

## 2015-09-29 DIAGNOSIS — K439 Ventral hernia without obstruction or gangrene: Secondary | ICD-10-CM | POA: Diagnosis not present

## 2015-10-01 ENCOUNTER — Encounter: Payer: Self-pay | Admitting: *Deleted

## 2015-10-01 ENCOUNTER — Telehealth: Payer: Self-pay | Admitting: Cardiology

## 2015-10-01 DIAGNOSIS — Z01810 Encounter for preprocedural cardiovascular examination: Secondary | ICD-10-CM

## 2015-10-01 DIAGNOSIS — I2510 Atherosclerotic heart disease of native coronary artery without angina pectoris: Secondary | ICD-10-CM

## 2015-10-01 NOTE — Addendum Note (Signed)
Addended by: Merlene Laughter on: 10/01/2015 10:25 AM   Modules accepted: Orders

## 2015-10-01 NOTE — Telephone Encounter (Addendum)
Patient informed and verbalized understanding of plan. Patient instructions verbally given over the phone and also patient informed that she can access this information in [place] under letters.

## 2015-10-01 NOTE — Telephone Encounter (Signed)
Coronary artery disease involving native coronary artery of native heart without angina pectoris cardiovascular examination Lexiscan Cardiolite on medications  Schedule at [facility] Jan 18th arrive at 8am

## 2015-10-01 NOTE — Telephone Encounter (Signed)
Received communication from Dr. Macy Mis in [place] regarding plan to proceed with surgical repair of the patient's large ventral hernia. The patient and I had discussed this when I met her recently (former patient of Dr. Ron Parker). We discussed arranging a follow-up stress test prior to surgery since it had been nearly 3 years since her last coronary intervention. Please schedule a Lexiscan Cardiolite study on medical therapy.

## 2015-10-05 NOTE — Telephone Encounter (Signed)
AUTH # [id number] EXP 01/02/16

## 2015-10-06 ENCOUNTER — Encounter (HOSPITAL_COMMUNITY)
Admission: RE | Admit: 2015-10-06 | Discharge: 2015-10-06 | Disposition: A | Discharge status: Home / Self Care | Payer: Medicare HMO | Source: Ambulatory Visit | Attending: Cardiology | Admitting: Cardiology

## 2015-10-06 ENCOUNTER — Inpatient Hospital Stay (HOSPITAL_COMMUNITY): Admission: RE | Admit: 2015-10-06 | Payer: Medicare Other | Source: Ambulatory Visit

## 2015-10-06 ENCOUNTER — Encounter (HOSPITAL_COMMUNITY): Payer: Self-pay

## 2015-10-06 DIAGNOSIS — I2510 Atherosclerotic heart disease of native coronary artery without angina pectoris: Secondary | ICD-10-CM | POA: Insufficient documentation

## 2015-10-06 DIAGNOSIS — Z01810 Encounter for preprocedural cardiovascular examination: Secondary | ICD-10-CM

## 2015-10-06 LAB — NM MYOCAR MULTI W/SPECT W/WALL MOTION / EF
CHL CUP NUCLEAR SDS: 10
CHL CUP NUCLEAR SSS: 13
CSEPPHR: 107 {beats}/min
LHR: 0.25
LV sys vol: 33 mL
LVDIAVOL: 91 mL
Rest HR: 64 {beats}/min
SRS: 3
TID: 1.34

## 2015-10-06 MED ORDER — SODIUM CHLORIDE 0.9 % IJ SOLN
INTRAMUSCULAR | Status: AC
  Administered 2015-10-06: 10 mL via INTRAVENOUS
  Filled 2015-10-06: qty 3

## 2015-10-06 MED ORDER — TECHNETIUM TC 99M SESTAMIBI GENERIC - CARDIOLITE
10.0000 | Freq: Once | INTRAVENOUS | Status: AC | PRN
  Administered 2015-10-06: 10 via INTRAVENOUS

## 2015-10-06 MED ORDER — TECHNETIUM TC 99M SESTAMIBI - CARDIOLITE
30.0000 | Freq: Once | INTRAVENOUS | Status: AC | PRN
  Administered 2015-10-06: 10:00:00 30 via INTRAVENOUS

## 2015-10-06 MED ORDER — REGADENOSON 0.4 MG/5ML IV SOLN
INTRAVENOUS | Status: AC
  Administered 2015-10-06: 0.4 mg via INTRAVENOUS
  Filled 2015-10-06: qty 5

## 2015-10-07 ENCOUNTER — Encounter: Payer: Self-pay | Admitting: *Deleted

## 2015-10-07 DIAGNOSIS — E039 Hypothyroidism, unspecified: Secondary | ICD-10-CM | POA: Diagnosis not present

## 2015-10-07 DIAGNOSIS — E1165 Type 2 diabetes mellitus with hyperglycemia: Secondary | ICD-10-CM | POA: Diagnosis not present

## 2015-10-07 DIAGNOSIS — K432 Incisional hernia without obstruction or gangrene: Secondary | ICD-10-CM | POA: Diagnosis not present

## 2015-10-07 DIAGNOSIS — I2510 Atherosclerotic heart disease of native coronary artery without angina pectoris: Secondary | ICD-10-CM | POA: Diagnosis not present

## 2015-11-12 DIAGNOSIS — L02414 Cutaneous abscess of left upper limb: Secondary | ICD-10-CM | POA: Diagnosis not present

## 2015-12-06 DIAGNOSIS — E039 Hypothyroidism, unspecified: Secondary | ICD-10-CM | POA: Diagnosis not present

## 2015-12-06 DIAGNOSIS — I10 Essential (primary) hypertension: Secondary | ICD-10-CM | POA: Diagnosis not present

## 2015-12-06 DIAGNOSIS — E1165 Type 2 diabetes mellitus with hyperglycemia: Secondary | ICD-10-CM | POA: Diagnosis not present

## 2015-12-06 DIAGNOSIS — R51 Headache: Secondary | ICD-10-CM | POA: Diagnosis not present

## 2015-12-30 DIAGNOSIS — E1165 Type 2 diabetes mellitus with hyperglycemia: Secondary | ICD-10-CM | POA: Diagnosis not present

## 2015-12-30 DIAGNOSIS — E7800 Pure hypercholesterolemia, unspecified: Secondary | ICD-10-CM | POA: Diagnosis not present

## 2015-12-30 DIAGNOSIS — Z13220 Encounter for screening for lipoid disorders: Secondary | ICD-10-CM | POA: Diagnosis not present

## 2015-12-30 DIAGNOSIS — I10 Essential (primary) hypertension: Secondary | ICD-10-CM | POA: Diagnosis not present

## 2015-12-30 DIAGNOSIS — E039 Hypothyroidism, unspecified: Secondary | ICD-10-CM | POA: Diagnosis not present

## 2016-01-04 DIAGNOSIS — E039 Hypothyroidism, unspecified: Secondary | ICD-10-CM | POA: Diagnosis not present

## 2016-01-04 DIAGNOSIS — K432 Incisional hernia without obstruction or gangrene: Secondary | ICD-10-CM | POA: Diagnosis not present

## 2016-01-04 DIAGNOSIS — E1165 Type 2 diabetes mellitus with hyperglycemia: Secondary | ICD-10-CM | POA: Diagnosis not present

## 2016-03-01 ENCOUNTER — Ambulatory Visit: Payer: Medicare HMO | Admitting: Cardiology

## 2016-03-06 DIAGNOSIS — M109 Gout, unspecified: Secondary | ICD-10-CM | POA: Diagnosis not present

## 2016-03-06 DIAGNOSIS — N289 Disorder of kidney and ureter, unspecified: Secondary | ICD-10-CM | POA: Diagnosis not present

## 2016-03-06 DIAGNOSIS — I2510 Atherosclerotic heart disease of native coronary artery without angina pectoris: Secondary | ICD-10-CM | POA: Diagnosis not present

## 2016-03-06 DIAGNOSIS — E119 Type 2 diabetes mellitus without complications: Secondary | ICD-10-CM | POA: Diagnosis not present

## 2016-03-06 DIAGNOSIS — E079 Disorder of thyroid, unspecified: Secondary | ICD-10-CM | POA: Diagnosis not present

## 2016-03-06 DIAGNOSIS — Z01818 Encounter for other preprocedural examination: Secondary | ICD-10-CM | POA: Diagnosis not present

## 2016-03-06 DIAGNOSIS — E7800 Pure hypercholesterolemia, unspecified: Secondary | ICD-10-CM | POA: Diagnosis not present

## 2016-03-06 DIAGNOSIS — M1990 Unspecified osteoarthritis, unspecified site: Secondary | ICD-10-CM | POA: Diagnosis not present

## 2016-03-06 DIAGNOSIS — K439 Ventral hernia without obstruction or gangrene: Secondary | ICD-10-CM | POA: Diagnosis not present

## 2016-03-06 DIAGNOSIS — I10 Essential (primary) hypertension: Secondary | ICD-10-CM | POA: Diagnosis not present

## 2016-03-14 DIAGNOSIS — K436 Other and unspecified ventral hernia with obstruction, without gangrene: Secondary | ICD-10-CM | POA: Diagnosis not present

## 2016-03-15 DIAGNOSIS — I10 Essential (primary) hypertension: Secondary | ICD-10-CM | POA: Diagnosis not present

## 2016-04-11 DIAGNOSIS — E039 Hypothyroidism, unspecified: Secondary | ICD-10-CM | POA: Diagnosis not present

## 2016-04-11 DIAGNOSIS — M1000 Idiopathic gout, unspecified site: Secondary | ICD-10-CM | POA: Diagnosis not present

## 2016-04-11 DIAGNOSIS — E1165 Type 2 diabetes mellitus with hyperglycemia: Secondary | ICD-10-CM | POA: Diagnosis not present

## 2016-04-11 DIAGNOSIS — K429 Umbilical hernia without obstruction or gangrene: Secondary | ICD-10-CM | POA: Diagnosis not present

## 2016-04-11 DIAGNOSIS — Z1389 Encounter for screening for other disorder: Secondary | ICD-10-CM | POA: Diagnosis not present

## 2016-04-11 DIAGNOSIS — Z6835 Body mass index (BMI) 35.0-35.9, adult: Secondary | ICD-10-CM | POA: Diagnosis not present

## 2016-04-11 DIAGNOSIS — I10 Essential (primary) hypertension: Secondary | ICD-10-CM | POA: Diagnosis not present

## 2016-05-11 DIAGNOSIS — D696 Thrombocytopenia, unspecified: Secondary | ICD-10-CM | POA: Diagnosis not present

## 2016-05-11 DIAGNOSIS — E1165 Type 2 diabetes mellitus with hyperglycemia: Secondary | ICD-10-CM | POA: Diagnosis not present

## 2016-05-11 DIAGNOSIS — R410 Disorientation, unspecified: Secondary | ICD-10-CM | POA: Diagnosis not present

## 2016-05-11 DIAGNOSIS — I6523 Occlusion and stenosis of bilateral carotid arteries: Secondary | ICD-10-CM | POA: Diagnosis not present

## 2016-05-11 DIAGNOSIS — I214 Non-ST elevation (NSTEMI) myocardial infarction: Secondary | ICD-10-CM | POA: Diagnosis not present

## 2016-05-11 DIAGNOSIS — R9431 Abnormal electrocardiogram [ECG] [EKG]: Secondary | ICD-10-CM | POA: Diagnosis not present

## 2016-05-11 DIAGNOSIS — I517 Cardiomegaly: Secondary | ICD-10-CM | POA: Diagnosis not present

## 2016-05-11 DIAGNOSIS — J952 Acute pulmonary insufficiency following nonthoracic surgery: Secondary | ICD-10-CM | POA: Diagnosis not present

## 2016-05-11 DIAGNOSIS — J9811 Atelectasis: Secondary | ICD-10-CM | POA: Diagnosis not present

## 2016-05-11 DIAGNOSIS — N179 Acute kidney failure, unspecified: Secondary | ICD-10-CM | POA: Diagnosis not present

## 2016-05-11 DIAGNOSIS — G8918 Other acute postprocedural pain: Secondary | ICD-10-CM | POA: Diagnosis not present

## 2016-05-11 DIAGNOSIS — E11649 Type 2 diabetes mellitus with hypoglycemia without coma: Secondary | ICD-10-CM | POA: Diagnosis not present

## 2016-05-11 DIAGNOSIS — M109 Gout, unspecified: Secondary | ICD-10-CM | POA: Diagnosis not present

## 2016-05-11 DIAGNOSIS — R1084 Generalized abdominal pain: Secondary | ICD-10-CM | POA: Diagnosis not present

## 2016-05-11 DIAGNOSIS — K439 Ventral hernia without obstruction or gangrene: Secondary | ICD-10-CM | POA: Diagnosis not present

## 2016-05-11 DIAGNOSIS — E785 Hyperlipidemia, unspecified: Secondary | ICD-10-CM | POA: Diagnosis not present

## 2016-05-11 DIAGNOSIS — R0602 Shortness of breath: Secondary | ICD-10-CM | POA: Diagnosis not present

## 2016-05-11 DIAGNOSIS — I959 Hypotension, unspecified: Secondary | ICD-10-CM | POA: Diagnosis not present

## 2016-05-11 DIAGNOSIS — R918 Other nonspecific abnormal finding of lung field: Secondary | ICD-10-CM | POA: Diagnosis not present

## 2016-05-11 DIAGNOSIS — I213 ST elevation (STEMI) myocardial infarction of unspecified site: Secondary | ICD-10-CM | POA: Diagnosis not present

## 2016-05-11 DIAGNOSIS — R739 Hyperglycemia, unspecified: Secondary | ICD-10-CM | POA: Diagnosis not present

## 2016-05-11 DIAGNOSIS — R6510 Systemic inflammatory response syndrome (SIRS) of non-infectious origin without acute organ dysfunction: Secondary | ICD-10-CM | POA: Diagnosis not present

## 2016-05-11 DIAGNOSIS — K436 Other and unspecified ventral hernia with obstruction, without gangrene: Secondary | ICD-10-CM | POA: Diagnosis not present

## 2016-05-11 DIAGNOSIS — I2510 Atherosclerotic heart disease of native coronary artery without angina pectoris: Secondary | ICD-10-CM | POA: Diagnosis not present

## 2016-05-11 DIAGNOSIS — E1122 Type 2 diabetes mellitus with diabetic chronic kidney disease: Secondary | ICD-10-CM | POA: Diagnosis not present

## 2016-05-11 DIAGNOSIS — E65 Localized adiposity: Secondary | ICD-10-CM | POA: Diagnosis not present

## 2016-05-25 DIAGNOSIS — E039 Hypothyroidism, unspecified: Secondary | ICD-10-CM | POA: Diagnosis not present

## 2016-05-25 DIAGNOSIS — Z6833 Body mass index (BMI) 33.0-33.9, adult: Secondary | ICD-10-CM | POA: Diagnosis not present

## 2016-05-25 DIAGNOSIS — K429 Umbilical hernia without obstruction or gangrene: Secondary | ICD-10-CM | POA: Diagnosis not present

## 2016-05-25 DIAGNOSIS — I2510 Atherosclerotic heart disease of native coronary artery without angina pectoris: Secondary | ICD-10-CM | POA: Diagnosis not present

## 2016-05-25 DIAGNOSIS — I10 Essential (primary) hypertension: Secondary | ICD-10-CM | POA: Diagnosis not present

## 2016-05-25 DIAGNOSIS — E1165 Type 2 diabetes mellitus with hyperglycemia: Secondary | ICD-10-CM | POA: Diagnosis not present

## 2016-05-31 DIAGNOSIS — K439 Ventral hernia without obstruction or gangrene: Secondary | ICD-10-CM | POA: Diagnosis not present

## 2016-06-07 DIAGNOSIS — L03311 Cellulitis of abdominal wall: Secondary | ICD-10-CM | POA: Diagnosis not present

## 2016-06-07 DIAGNOSIS — Z6833 Body mass index (BMI) 33.0-33.9, adult: Secondary | ICD-10-CM | POA: Diagnosis not present

## 2016-06-07 DIAGNOSIS — K432 Incisional hernia without obstruction or gangrene: Secondary | ICD-10-CM | POA: Diagnosis not present

## 2016-06-14 DIAGNOSIS — R69 Illness, unspecified: Secondary | ICD-10-CM | POA: Diagnosis not present

## 2016-06-28 ENCOUNTER — Encounter: Payer: Self-pay | Admitting: *Deleted

## 2016-06-28 DIAGNOSIS — Z6833 Body mass index (BMI) 33.0-33.9, adult: Secondary | ICD-10-CM | POA: Diagnosis not present

## 2016-06-28 DIAGNOSIS — Z9889 Other specified postprocedural states: Secondary | ICD-10-CM | POA: Diagnosis not present

## 2016-06-29 ENCOUNTER — Encounter: Payer: Self-pay | Admitting: Cardiology

## 2016-06-29 ENCOUNTER — Ambulatory Visit (INDEPENDENT_AMBULATORY_CARE_PROVIDER_SITE_OTHER): Payer: Medicare HMO | Admitting: Cardiology

## 2016-06-29 VITALS — BP 106/67 | HR 57 | Ht 60.0 in | Wt 201.0 lb

## 2016-06-29 DIAGNOSIS — N183 Chronic kidney disease, stage 3 unspecified: Secondary | ICD-10-CM

## 2016-06-29 DIAGNOSIS — I2510 Atherosclerotic heart disease of native coronary artery without angina pectoris: Secondary | ICD-10-CM

## 2016-06-29 DIAGNOSIS — I214 Non-ST elevation (NSTEMI) myocardial infarction: Secondary | ICD-10-CM | POA: Diagnosis not present

## 2016-06-29 DIAGNOSIS — E782 Mixed hyperlipidemia: Secondary | ICD-10-CM | POA: Diagnosis not present

## 2016-06-29 DIAGNOSIS — I10 Essential (primary) hypertension: Secondary | ICD-10-CM

## 2016-06-29 MED ORDER — LISINOPRIL 20 MG PO TABS: 20.0000 mg | ORAL_TABLET | Freq: Every day | ORAL | 3 refills | Status: DC

## 2016-06-29 NOTE — Progress Notes (Signed)
Cardiology Office Note  Date: 06/29/2016   ID: Donna Howe, DOB 11-30-1955, MRN [id number]  PCP: Rory Percy, MD  Primary Cardiologist: Rozann Lesches, MD   Chief Complaint  Patient presents with  . Coronary Artery Disease    History of Present Illness: Donna Howe is a 60 y.o. female that I met in the office in December 2016, a former patient of Dr. Ron Parker. She presents for a follow-up visit. I reviewed extensive interval records from [facility]. Patient underwent an open ventral hernia repair with panniculectomy on August 24. Postoperative course was complicated by altered mental status and hypoxia as well as NSTEMI with reported troponin I elevation up to 79. ECG and echocardiogram are outlined below. She was seen by [facility] cardiology, was managed medically, discharge summary indicates that the patient had a CT cardiac angiogram and was cleared for discharge on medical therapy.   I was able to obtain report of the cardiac CT indicating that the stent site within the left main and circumflex was patent, there was moderate to severe stenosis of the ostial LAD with patent LIMA to LAD, subtotal occlusion of an OM/ramus branch with stent visualized in the mid to distal portion of the vessel, diffusely diseased RCA with patent SVG to RCA with proximal 40-50% stenosis, and an occluded SVG was also noted (likely to OM). This fits with the patient's known coronary anatomy based on last PCI in 2014 at which time she had an occluded SVG to OM.  Fortunately, she presents today stating that she has been feeling well overall from a cardiac perspective, no angina symptoms or nitroglycerin use. She has gone back on low-dose Lopressor and is also back on lisinopril. Blood pressure and heart rate are well controlled today.  She tells me that she has to go back to [place] to have a percutaneous drain placed by interventional radiology under sedation next week.  We discussed the  cardiac issues during her hospitalization in August as well as testing and implications. Main area of myocardium at risk looks to be related to the OM/ramus stenosis, but in the absence of angina on medical therapy, difficult to push for a cardiac catheterization at this time.  Past Medical History:  Diagnosis Date  . Anemia   . Asthma   . CAD (coronary artery disease)    Multivessel s/p CABG, occluded SVG to OM, DES to LM/circ 12/2012  . Carotid artery disease ([place])   . Chronic kidney disease (CKD), stage III (moderate)   . Essential hypertension   . Gout   . Hyperlipidemia   . Myocardial infarction   . PAD (peripheral artery disease) ([place])    Dr. Kellie Simmering  . SBO (small bowel obstruction) 2011   Status post lysis of adhesions & hernia repair  . Thrombocytopenia, unspecified   . Type 2 diabetes mellitus ([place])   . Umbilical hernia     Past Surgical History:  Procedure Laterality Date  . CESAREAN SECTION  1984  . CHOLECYSTECTOMY  2010  . CORONARY ARTERY BYPASS GRAFT  2005  . ENDARTERECTOMY Right 04/18/2013   Procedure: ENDARTERECTOMY CAROTID;  Surgeon: Mal Misty, MD;  Location: [place];  Service: Vascular;  Laterality: Right;  . HERNIA REPAIR  1989  . Incisional hernia repair x2  03/04/2010   Laparoscopic with 35cm mesh by Dr Ronnald Collum  . LEFT HEART CATHETERIZATION WITH CORONARY ANGIOGRAM N/A 12/19/2012   Procedure: LEFT HEART CATHETERIZATION WITH CORONARY ANGIOGRAM;  Surgeon: Josue Hector,  MD;  Location: [place] CATH LAB;  Service: Cardiovascular;  Laterality: N/A;  . LEFT HEART CATHETERIZATION WITH CORONARY/GRAFT ANGIOGRAM N/A 04/19/2013   Procedure: LEFT HEART CATHETERIZATION WITH Beatrix Fetters;  Surgeon: Lorretta Harp, MD;  Location: [facility] CATH LAB;  Service: Cardiovascular;  Laterality: N/A;  . PATCH ANGIOPLASTY Right 04/18/2013   Procedure: PATCH ANGIOPLASTY Right Internal Carotid Artery;  Surgeon: Mal Misty, MD;  Location: [place];  Service: Vascular;  Laterality:  Right;  . PERCUTANEOUS CORONARY STENT INTERVENTION (PCI-S) Right 12/19/2012   Procedure: PERCUTANEOUS CORONARY STENT INTERVENTION (PCI-S);  Surgeon: Josue Hector, MD;  Location: [facility] CATH LAB;  Service: Cardiovascular;  Laterality: Right;  . SHOULDER SURGERY      Current Outpatient Prescriptions  Medication Sig Dispense Refill  . ALPRAZolam (XANAX) 0.5 MG tablet Take 1/2 tab by mouth every morning & evening and 1 tab at bedtime    . aspirin EC 81 MG tablet Take 81 mg by mouth daily.    Marland Kitchen atorvastatin (LIPITOR) 40 MG tablet Take 1 tablet (40 mg total) by mouth daily. 90 tablet 3  . clopidogrel (PLAVIX) 75 MG tablet Take 1 tablet (75 mg total) by mouth daily. 30 tablet 11  . colchicine 0.6 MG tablet Take 0.6 mg by mouth daily.     Marland Kitchen gabapentin (NEURONTIN) 400 MG capsule Take 400 mg by mouth 2 (two) times daily.     . isosorbide dinitrate (ISORDIL) 20 MG tablet Take 20 mg by mouth daily.    Marland Kitchen levothyroxine (SYNTHROID, LEVOTHROID) 125 MCG tablet Take 125 mcg by mouth every morning.    Marland Kitchen lisinopril (PRINIVIL,ZESTRIL) 20 MG tablet Take 20 mg by mouth daily.    . metoprolol tartrate (LOPRESSOR) 25 MG tablet Take 12.5 mg by mouth 2 (two) times daily.    . nitroGLYCERIN (NITROSTAT) 0.4 MG SL tablet Place 0.4 mg under the tongue every 5 (five) minutes as needed for chest pain. Not to exceed 3 in 15 minute time frame    . NOVOLIN 70/30 RELION (70-30) 100 UNIT/ML injection Inject 20 Units into the skin 3 (three) times daily.    . ondansetron (ZOFRAN ODT) 8 MG disintegrating tablet Take 1 tablet (8 mg total) by mouth every 8 (eight) hours as needed for nausea or vomiting. 20 tablet 0  . oxyCODONE-acetaminophen (PERCOCET) 10-325 MG per tablet Take 1 tablet by mouth every 6 (six) hours as needed for pain. 30 tablet 0  . sulfamethoxazole-trimethoprim (BACTRIM DS,SEPTRA DS) 800-160 MG tablet Take 1 tablet by mouth 2 (two) times daily.     No current facility-administered medications for this visit.     Allergies:  Penicillins   Social History: The patient  reports that she quit smoking about 3 years ago. Her smoking use included Cigarettes. She has a 20.00 pack-year smoking history. She has never used smokeless tobacco. She reports that she does not drink alcohol or use drugs.   ROS:  Please see the history of present illness. Otherwise, complete review of systems is positive for lower abdominal swelling.  All other systems are reviewed and negative.   Physical Exam: VS:  BP 106/67   Pulse (!) 57   Ht 5' (1.524 m)   Wt 201 lb (91.2 kg)   SpO2 100%   BMI 39.26 kg/m , BMI Body mass index is 39.26 kg/m.  Wt Readings from Last 3 Encounters:  06/29/16 201 lb (91.2 kg)  09/16/15 222 lb 9.6 oz (101 kg)  08/13/15 229 lb (103.9 kg)  General: Overweight woman, appears comfortable at rest. HEENT: Conjunctiva and lids normal, oropharynx clear. Neck: Supple, no elevated JVP, soft right carotid bruit with CEA scar, no thyromegaly. Lungs: Clear to auscultation, nonlabored breathing at rest. Cardiac: Regular rate and rhythm, no S3, 2/6 systolic murmur, no pericardial rub. Abdomen: Protuberant with apparent ascites lower abdomen, bowel sounds present. Extremities: No pitting edema, distal pulses 2+. Skin: Warm and dry. Musculoskeletal: No kyphosis. Neuropsychiatric: Alert and oriented x3, affect grossly appropriate.  ECG: I personally reviewed the tracing from 05/12/2016 which showed sinus rhythm with nonspecific ST changes.  Recent Labwork: 08/13/2015: ALT 20; AST 20; BUN 27; Creatinine, Ser 1.55; Hemoglobin 14.6; Platelets 184; Potassium 3.9; Sodium 142   Other Studies Reviewed Today:  Echocardiogram 05/13/2016 [facility]): Mild LVH with LVEF 50-55%, no regional wall motion abnormalities, grade 1 diastolic dysfunction, trace mitral regurgitation, trivial pericardial effusion.  Lexiscan Myoview 10/06/2015: No diagnostic ST segment changes, moderate region of  inferolateral scar with mild peri-infarct ischemia, LVEF 55-65%, overall low risk.  Assessment and Plan:  1. Multivessel CAD status post CABG in 2005 with known occluded SVG to OM/ramus. She had an NSTEMI in August in the postoperative setting after open ventral hernia repair. This was managed medically with follow-up cardiac CTA as detailed above. Importantly, she had patent left main and circumflex stent site, patent LIMA to LAD and SVG to RCA (with mild to moderate graft stenosis). LVEF was also 50-55% by echocardiogram. She is not reporting any angina on medical therapy which looks to be well-rounded at this time. Would continue cautious observation. Certainly, if she starts to manifest recurring angina will discuss a cardiac catheterization to see if there are any viable revascularization options.  2. Essential hypertension, blood pressure is well controlled today.  3. Hyperlipidemia, she continues on Lipitor.  4. CKD stage 3 by history. I reviewed her lab work from [place].  Current medicines were reviewed with the patient today.  Disposition: Follow-up with me in 3 months, sooner if needed.  Signed, Satira Sark, MD, [facility] 06/29/2016 2:36 PM    [facility] [address], [place], [place] [address] Phone: [phone]; Fax: [phone]

## 2016-06-29 NOTE — Patient Instructions (Signed)
Your physician recommends that you schedule a follow-up appointment in: 3 MONTHS WITH DR. MCDOWELL  Your physician recommends that you continue on your current medications as directed. Please refer to the Current Medication list given to you today.  Thank you for choosing [facility]!!    

## 2016-07-03 DIAGNOSIS — Z4682 Encounter for fitting and adjustment of non-vascular catheter: Secondary | ICD-10-CM | POA: Diagnosis not present

## 2016-07-03 DIAGNOSIS — K439 Ventral hernia without obstruction or gangrene: Secondary | ICD-10-CM | POA: Diagnosis not present

## 2016-07-03 DIAGNOSIS — T814XXA Infection following a procedure, initial encounter: Secondary | ICD-10-CM | POA: Diagnosis not present

## 2016-07-11 DIAGNOSIS — Z6832 Body mass index (BMI) 32.0-32.9, adult: Secondary | ICD-10-CM | POA: Diagnosis not present

## 2016-07-11 DIAGNOSIS — L03311 Cellulitis of abdominal wall: Secondary | ICD-10-CM | POA: Diagnosis not present

## 2016-07-25 DIAGNOSIS — E7800 Pure hypercholesterolemia, unspecified: Secondary | ICD-10-CM | POA: Diagnosis not present

## 2016-07-25 DIAGNOSIS — E1165 Type 2 diabetes mellitus with hyperglycemia: Secondary | ICD-10-CM | POA: Diagnosis not present

## 2016-07-25 DIAGNOSIS — I10 Essential (primary) hypertension: Secondary | ICD-10-CM | POA: Diagnosis not present

## 2016-07-27 DIAGNOSIS — E1165 Type 2 diabetes mellitus with hyperglycemia: Secondary | ICD-10-CM | POA: Diagnosis not present

## 2016-07-27 DIAGNOSIS — I2510 Atherosclerotic heart disease of native coronary artery without angina pectoris: Secondary | ICD-10-CM | POA: Diagnosis not present

## 2016-08-02 ENCOUNTER — Encounter: Payer: Self-pay | Admitting: Family

## 2016-08-15 ENCOUNTER — Encounter (HOSPITAL_COMMUNITY): Payer: Medicare Other

## 2016-08-15 ENCOUNTER — Ambulatory Visit: Payer: Medicare HMO | Admitting: Family

## 2016-08-30 DIAGNOSIS — J9801 Acute bronchospasm: Secondary | ICD-10-CM | POA: Diagnosis not present

## 2016-08-30 DIAGNOSIS — R05 Cough: Secondary | ICD-10-CM | POA: Diagnosis not present

## 2016-08-30 DIAGNOSIS — Z6831 Body mass index (BMI) 31.0-31.9, adult: Secondary | ICD-10-CM | POA: Diagnosis not present

## 2016-09-20 DIAGNOSIS — R69 Illness, unspecified: Secondary | ICD-10-CM | POA: Diagnosis not present

## 2016-09-22 DIAGNOSIS — E039 Hypothyroidism, unspecified: Secondary | ICD-10-CM | POA: Diagnosis not present

## 2016-09-22 DIAGNOSIS — Z6831 Body mass index (BMI) 31.0-31.9, adult: Secondary | ICD-10-CM | POA: Diagnosis not present

## 2016-09-22 DIAGNOSIS — E1165 Type 2 diabetes mellitus with hyperglycemia: Secondary | ICD-10-CM | POA: Diagnosis not present

## 2016-09-22 DIAGNOSIS — J9801 Acute bronchospasm: Secondary | ICD-10-CM | POA: Diagnosis not present

## 2016-09-22 DIAGNOSIS — I10 Essential (primary) hypertension: Secondary | ICD-10-CM | POA: Diagnosis not present

## 2016-09-22 DIAGNOSIS — E7800 Pure hypercholesterolemia, unspecified: Secondary | ICD-10-CM | POA: Diagnosis not present

## 2016-09-22 DIAGNOSIS — J209 Acute bronchitis, unspecified: Secondary | ICD-10-CM | POA: Diagnosis not present

## 2016-09-22 DIAGNOSIS — R05 Cough: Secondary | ICD-10-CM | POA: Diagnosis not present

## 2016-09-27 DIAGNOSIS — Z6831 Body mass index (BMI) 31.0-31.9, adult: Secondary | ICD-10-CM | POA: Diagnosis not present

## 2016-09-27 DIAGNOSIS — I2510 Atherosclerotic heart disease of native coronary artery without angina pectoris: Secondary | ICD-10-CM | POA: Diagnosis not present

## 2016-09-27 DIAGNOSIS — I10 Essential (primary) hypertension: Secondary | ICD-10-CM | POA: Diagnosis not present

## 2016-09-27 DIAGNOSIS — E039 Hypothyroidism, unspecified: Secondary | ICD-10-CM | POA: Diagnosis not present

## 2016-10-03 ENCOUNTER — Ambulatory Visit: Payer: Medicare HMO | Admitting: Cardiology

## 2016-10-27 ENCOUNTER — Ambulatory Visit: Payer: Medicare HMO | Admitting: Cardiology

## 2016-10-30 DIAGNOSIS — R69 Illness, unspecified: Secondary | ICD-10-CM | POA: Diagnosis not present

## 2016-11-29 NOTE — Progress Notes (Signed)
Cardiology Office Note  Date: 12/01/2016   ID: Donna Howe, DOB 02/26/1956, MRN [id number]  PCP: Rory Percy, MD  Primary Cardiologist: Rozann Lesches, MD   Chief Complaint  Patient presents with  . Coronary Artery Disease    History of Present Illness: Donna Howe is a 61 y.o. female last seen in October 2017. She presents for a routine follow-up visit. Reports no angina symptoms on medical therapy. She has been under a lot of stress, states her boyfriend has been hospitalized in [place] with sepsis.  Cardiac CT done at [facility] last year indicated that the stent site within the left main and circumflex was patent, there was moderate to severe stenosis of the ostial LAD with patent LIMA to LAD, subtotal occlusion of an OM/ramus branch with stent visualized in the mid to distal portion of the vessel, diffusely diseased RCA with patent SVG to RCA with proximal 40-50% stenosis, and an occluded SVG was also noted (likely to OM). This fits with the patient's known coronary anatomy based on last PCI in 2014 at which time she had an occluded SVG to OM.  Current cardiac regimen includes aspirin, Lipitor, Plavix, Isordil, lisinopril, Lopressor, and as needed nitroglycerin.  Past Medical History:  Diagnosis Date  . Anemia   . Asthma   . CAD (coronary artery disease)    Multivessel s/p CABG, occluded SVG to OM, DES to LM/circ 12/2012  . Carotid artery disease ([place])   . Chronic kidney disease (CKD), stage III (moderate)   . Essential hypertension   . Gout   . Hyperlipidemia   . Myocardial infarction   . PAD (peripheral artery disease) ([place])    Dr. Kellie Simmering  . SBO (small bowel obstruction) 2011   Status post lysis of adhesions & hernia repair  . Thrombocytopenia, unspecified   . Type 2 diabetes mellitus ([place])   . Umbilical hernia     Past Surgical History:  Procedure Laterality Date  . CESAREAN SECTION  1984  . CHOLECYSTECTOMY  2010  . CORONARY ARTERY  BYPASS GRAFT  2005  . ENDARTERECTOMY Right 04/18/2013   Procedure: ENDARTERECTOMY CAROTID;  Surgeon: Mal Misty, MD;  Location: [facility];  Service: Vascular;  Laterality: Right;  . HERNIA REPAIR  1989  . Incisional hernia repair x2  03/04/2010   Laparoscopic with 35cm mesh by Dr Ronnald Collum  . LEFT HEART CATHETERIZATION WITH CORONARY ANGIOGRAM N/A 12/19/2012   Procedure: LEFT HEART CATHETERIZATION WITH CORONARY ANGIOGRAM;  Surgeon: Josue Hector, MD;  Location: [facility] CATH LAB;  Service: Cardiovascular;  Laterality: N/A;  . LEFT HEART CATHETERIZATION WITH CORONARY/GRAFT ANGIOGRAM N/A 04/19/2013   Procedure: LEFT HEART CATHETERIZATION WITH Beatrix Fetters;  Surgeon: Lorretta Harp, MD;  Location: [facility] CATH LAB;  Service: Cardiovascular;  Laterality: N/A;  . PATCH ANGIOPLASTY Right 04/18/2013   Procedure: PATCH ANGIOPLASTY Right Internal Carotid Artery;  Surgeon: Mal Misty, MD;  Location: [facility];  Service: Vascular;  Laterality: Right;  . PERCUTANEOUS CORONARY STENT INTERVENTION (PCI-S) Right 12/19/2012   Procedure: PERCUTANEOUS CORONARY STENT INTERVENTION (PCI-S);  Surgeon: Josue Hector, MD;  Location: [facility] CATH LAB;  Service: Cardiovascular;  Laterality: Right;  . SHOULDER SURGERY      Current Outpatient Prescriptions  Medication Sig Dispense Refill  . ALPRAZolam (XANAX) 0.5 MG tablet Take 1/2 tab by mouth every morning & evening and 1 tab at bedtime    . aspirin EC 81 MG tablet Take 81 mg by mouth daily.    Marland Kitchen  atorvastatin (LIPITOR) 40 MG tablet Take 1 tablet (40 mg total) by mouth daily. 90 tablet 3  . clopidogrel (PLAVIX) 75 MG tablet Take 1 tablet (75 mg total) by mouth daily. 30 tablet 11  . gabapentin (NEURONTIN) 400 MG capsule Take 400 mg by mouth 2 (two) times daily.     . isosorbide dinitrate (ISORDIL) 20 MG tablet Take 20 mg by mouth daily.    Marland Kitchen levothyroxine (SYNTHROID, LEVOTHROID) 125 MCG tablet Take 125 mcg by mouth every morning.    Marland Kitchen lisinopril (PRINIVIL,ZESTRIL) 20 MG tablet  Take 1 tablet (20 mg total) by mouth daily. 90 tablet 3  . metoprolol tartrate (LOPRESSOR) 25 MG tablet Take 12.5 mg by mouth 2 (two) times daily.    . nitroGLYCERIN (NITROSTAT) 0.4 MG SL tablet Place 0.4 mg under the tongue every 5 (five) minutes as needed for chest pain. Not to exceed 3 in 15 minute time frame    . NOVOLIN 70/30 RELION (70-30) 100 UNIT/ML injection Inject 20 Units into the skin 3 (three) times daily.    . ondansetron (ZOFRAN ODT) 8 MG disintegrating tablet Take 1 tablet (8 mg total) by mouth every 8 (eight) hours as needed for nausea or vomiting. 20 tablet 0  . oxyCODONE-acetaminophen (PERCOCET) 10-325 MG per tablet Take 1 tablet by mouth every 6 (six) hours as needed for pain. 30 tablet 0  . colchicine 0.6 MG tablet Take 0.6 mg by mouth daily.      No current facility-administered medications for this visit.    Allergies:  Penicillins   Social History: The patient  reports that she quit smoking about 3 years ago. Her smoking use included Cigarettes. She has a 20.00 pack-year smoking history. She has never used smokeless tobacco. She reports that she does not drink alcohol or use drugs.   ROS:  Please see the history of present illness. Otherwise, complete review of systems is positive for none.  All other systems are reviewed and negative.   Physical Exam: VS:  BP (!) 179/74   Pulse (!) 47   Ht 5\' 6"  (1.676 m)   Wt 200 lb 9.6 oz (91 kg)   BMI 32.38 kg/m , BMI Body mass index is 32.38 kg/m.  Wt Readings from Last 3 Encounters:  12/01/16 200 lb 9.6 oz (91 kg)  06/29/16 201 lb (91.2 kg)  09/16/15 222 lb 9.6 oz (101 kg)    General: Overweight woman, appears comfortable at rest. HEENT: Conjunctiva and lids normal, oropharynx clear. Neck: Supple, no elevated JVP, soft right carotid bruit with CEA scar, no thyromegaly. Lungs: Clear to auscultation, nonlabored breathing at rest. Cardiac: Regular rate and rhythm, no S3, 2/6 systolic murmur, no pericardial rub. Abdomen:  Nontender, nondistended, bowel sounds present. Extremities: No pitting edema, distal pulses 2+. Skin: Warm and dry. Musculoskeletal: No kyphosis. Neuropsychiatric: Alert and oriented x3, affect grossly appropriate.  ECG: I personally reviewed the tracing from 05/12/2016 which showed sinus arrhythmia with old inferior infarct pattern and nonspecific ST-T wave changes, rule out ischemic change.  Recent Labwork:  08/13/2015: ALT 20; AST 20; BUN 27; Creatinine, Ser 1.55; Hemoglobin 14.6; Platelets 184; Potassium 3.9; Sodium 142   Other Studies Reviewed Today:  Echocardiogram 05/13/2016 [facility]): Mild LVH with LVEF 50-55%, no regional wall motion abnormalities, grade 1 diastolic dysfunction, trace mitral regurgitation, trivial pericardial effusion.  Lexiscan Myoview 10/06/2015: No diagnostic ST segment changes, moderate region of inferolateral scar with mild peri-infarct ischemia, LVEF 55-65%, overall low risk.  Assessment and Plan:  1. Multivessel CAD status post CABG in 2005. She has graft disease with known occluded SVG to the OM/ramus and at this point is stable without angina symptoms on medical therapy. We will continue with observation for now.  2. Essential hypertension, blood pressure elevated today, was better controlled to last visit. We reviewed her medications and discussed compliance. Also diet and salt restriction. She continues to follow with Dr. Nadara Mustard.  3. Hyperlipidemia, on Lipitor. No changes were made today. Keep follow-up with Dr. Nadara Mustard.  4. CKD stage III, creatinine 1.6 range. She does maintain ACE inhibitor use. Keep follow with Dr. Nadara Mustard for routine lab work.  Current medicines were reviewed with the patient today.  Disposition: Follow-up in 6 months, sooner if needed.  Signed, Satira Sark, MD, [facility] 12/01/2016 11:29 AM    [facility] [place], [place], [place] [address] Phone: [phone]; Fax:  [phone]

## 2016-12-01 ENCOUNTER — Encounter: Payer: Self-pay | Admitting: Cardiology

## 2016-12-01 ENCOUNTER — Ambulatory Visit (INDEPENDENT_AMBULATORY_CARE_PROVIDER_SITE_OTHER): Payer: Medicare HMO | Admitting: Cardiology

## 2016-12-01 VITALS — BP 179/74 | HR 47 | Ht 66.0 in | Wt 200.6 lb

## 2016-12-01 DIAGNOSIS — E782 Mixed hyperlipidemia: Secondary | ICD-10-CM

## 2016-12-01 DIAGNOSIS — N183 Chronic kidney disease, stage 3 unspecified: Secondary | ICD-10-CM

## 2016-12-01 DIAGNOSIS — I10 Essential (primary) hypertension: Secondary | ICD-10-CM | POA: Diagnosis not present

## 2016-12-01 DIAGNOSIS — I25119 Atherosclerotic heart disease of native coronary artery with unspecified angina pectoris: Secondary | ICD-10-CM | POA: Diagnosis not present

## 2016-12-01 NOTE — Patient Instructions (Signed)

## 2016-12-18 DIAGNOSIS — R69 Illness, unspecified: Secondary | ICD-10-CM | POA: Diagnosis not present

## 2016-12-26 DIAGNOSIS — M10472 Other secondary gout, left ankle and foot: Secondary | ICD-10-CM | POA: Diagnosis not present

## 2016-12-26 DIAGNOSIS — Z6832 Body mass index (BMI) 32.0-32.9, adult: Secondary | ICD-10-CM | POA: Diagnosis not present

## 2017-03-26 DIAGNOSIS — I10 Essential (primary) hypertension: Secondary | ICD-10-CM | POA: Diagnosis not present

## 2017-03-26 DIAGNOSIS — E039 Hypothyroidism, unspecified: Secondary | ICD-10-CM | POA: Diagnosis not present

## 2017-03-26 DIAGNOSIS — E7800 Pure hypercholesterolemia, unspecified: Secondary | ICD-10-CM | POA: Diagnosis not present

## 2017-03-26 DIAGNOSIS — E1165 Type 2 diabetes mellitus with hyperglycemia: Secondary | ICD-10-CM | POA: Diagnosis not present

## 2017-03-29 DIAGNOSIS — I10 Essential (primary) hypertension: Secondary | ICD-10-CM | POA: Diagnosis not present

## 2017-03-29 DIAGNOSIS — I2510 Atherosclerotic heart disease of native coronary artery without angina pectoris: Secondary | ICD-10-CM | POA: Diagnosis not present

## 2017-03-29 DIAGNOSIS — Z6831 Body mass index (BMI) 31.0-31.9, adult: Secondary | ICD-10-CM | POA: Diagnosis not present

## 2017-03-29 DIAGNOSIS — E039 Hypothyroidism, unspecified: Secondary | ICD-10-CM | POA: Diagnosis not present

## 2017-05-30 ENCOUNTER — Encounter (HOSPITAL_COMMUNITY): Payer: Self-pay | Admitting: Emergency Medicine

## 2017-05-30 ENCOUNTER — Emergency Department (HOSPITAL_COMMUNITY): Payer: Medicare HMO

## 2017-05-30 ENCOUNTER — Inpatient Hospital Stay (HOSPITAL_COMMUNITY)
Admission: EM | Admit: 2017-05-30 | Discharge: 2017-06-01 | DRG: 229 | Disposition: A | Discharge status: Home / Self Care | Payer: Medicare HMO | Attending: Family Medicine | Admitting: Family Medicine

## 2017-05-30 DIAGNOSIS — E669 Obesity, unspecified: Secondary | ICD-10-CM | POA: Diagnosis present

## 2017-05-30 DIAGNOSIS — G8929 Other chronic pain: Secondary | ICD-10-CM | POA: Diagnosis present

## 2017-05-30 DIAGNOSIS — Z8249 Family history of ischemic heart disease and other diseases of the circulatory system: Secondary | ICD-10-CM

## 2017-05-30 DIAGNOSIS — I25810 Atherosclerosis of coronary artery bypass graft(s) without angina pectoris: Secondary | ICD-10-CM | POA: Diagnosis present

## 2017-05-30 DIAGNOSIS — N186 End stage renal disease: Secondary | ICD-10-CM

## 2017-05-30 DIAGNOSIS — Z833 Family history of diabetes mellitus: Secondary | ICD-10-CM

## 2017-05-30 DIAGNOSIS — E1122 Type 2 diabetes mellitus with diabetic chronic kidney disease: Secondary | ICD-10-CM | POA: Diagnosis present

## 2017-05-30 DIAGNOSIS — I249 Acute ischemic heart disease, unspecified: Secondary | ICD-10-CM | POA: Diagnosis not present

## 2017-05-30 DIAGNOSIS — Z87891 Personal history of nicotine dependence: Secondary | ICD-10-CM

## 2017-05-30 DIAGNOSIS — N183 Chronic kidney disease, stage 3 unspecified: Secondary | ICD-10-CM | POA: Diagnosis present

## 2017-05-30 DIAGNOSIS — E039 Hypothyroidism, unspecified: Secondary | ICD-10-CM | POA: Diagnosis present

## 2017-05-30 DIAGNOSIS — I214 Non-ST elevation (NSTEMI) myocardial infarction: Secondary | ICD-10-CM | POA: Diagnosis not present

## 2017-05-30 DIAGNOSIS — J45901 Unspecified asthma with (acute) exacerbation: Secondary | ICD-10-CM | POA: Diagnosis present

## 2017-05-30 DIAGNOSIS — I959 Hypotension, unspecified: Secondary | ICD-10-CM | POA: Diagnosis present

## 2017-05-30 DIAGNOSIS — E782 Mixed hyperlipidemia: Secondary | ICD-10-CM | POA: Diagnosis not present

## 2017-05-30 DIAGNOSIS — I10 Essential (primary) hypertension: Secondary | ICD-10-CM | POA: Diagnosis present

## 2017-05-30 DIAGNOSIS — R001 Bradycardia, unspecified: Secondary | ICD-10-CM | POA: Diagnosis present

## 2017-05-30 DIAGNOSIS — Z794 Long term (current) use of insulin: Secondary | ICD-10-CM | POA: Diagnosis not present

## 2017-05-30 DIAGNOSIS — I9589 Other hypotension: Secondary | ICD-10-CM | POA: Diagnosis present

## 2017-05-30 DIAGNOSIS — Z7902 Long term (current) use of antithrombotics/antiplatelets: Secondary | ICD-10-CM

## 2017-05-30 DIAGNOSIS — Z6838 Body mass index (BMI) 38.0-38.9, adult: Secondary | ICD-10-CM

## 2017-05-30 DIAGNOSIS — E785 Hyperlipidemia, unspecified: Secondary | ICD-10-CM | POA: Diagnosis present

## 2017-05-30 DIAGNOSIS — Z7984 Long term (current) use of oral hypoglycemic drugs: Secondary | ICD-10-CM

## 2017-05-30 DIAGNOSIS — Z955 Presence of coronary angioplasty implant and graft: Secondary | ICD-10-CM

## 2017-05-30 DIAGNOSIS — E1159 Type 2 diabetes mellitus with other circulatory complications: Secondary | ICD-10-CM | POA: Diagnosis not present

## 2017-05-30 DIAGNOSIS — J45909 Unspecified asthma, uncomplicated: Secondary | ICD-10-CM | POA: Diagnosis present

## 2017-05-30 DIAGNOSIS — Z79899 Other long term (current) drug therapy: Secondary | ICD-10-CM

## 2017-05-30 DIAGNOSIS — R079 Chest pain, unspecified: Secondary | ICD-10-CM

## 2017-05-30 DIAGNOSIS — E1151 Type 2 diabetes mellitus with diabetic peripheral angiopathy without gangrene: Secondary | ICD-10-CM | POA: Diagnosis present

## 2017-05-30 DIAGNOSIS — Z951 Presence of aortocoronary bypass graft: Secondary | ICD-10-CM

## 2017-05-30 DIAGNOSIS — Z8679 Personal history of other diseases of the circulatory system: Secondary | ICD-10-CM

## 2017-05-30 DIAGNOSIS — Z7982 Long term (current) use of aspirin: Secondary | ICD-10-CM

## 2017-05-30 DIAGNOSIS — E119 Type 2 diabetes mellitus without complications: Secondary | ICD-10-CM

## 2017-05-30 DIAGNOSIS — I252 Old myocardial infarction: Secondary | ICD-10-CM

## 2017-05-30 DIAGNOSIS — I129 Hypertensive chronic kidney disease with stage 1 through stage 4 chronic kidney disease, or unspecified chronic kidney disease: Secondary | ICD-10-CM | POA: Diagnosis present

## 2017-05-30 HISTORY — DX: Hypothyroidism, unspecified: E03.9

## 2017-05-30 LAB — BASIC METABOLIC PANEL
Anion gap: 9 (ref 5–15)
BUN: 30 mg/dL — ABNORMAL HIGH (ref 6–20)
CO2: 25 mmol/L (ref 22–32)
Calcium: 9.6 mg/dL (ref 8.9–10.3)
Chloride: 102 mmol/L (ref 101–111)
Creatinine, Ser: 1.82 mg/dL — ABNORMAL HIGH (ref 0.44–1.00)
GFR calc Af Amer: 34 mL/min — ABNORMAL LOW (ref >60)
GFR calc non Af Amer: 30 mL/min — ABNORMAL LOW (ref >60)
Glucose, Bld: 178 mg/dL — ABNORMAL HIGH (ref 65–99)
Potassium: 4.2 mmol/L (ref 3.5–5.1)
Sodium: 136 mmol/L (ref 135–145)

## 2017-05-30 LAB — CBC
HCT: 40.5 % (ref 36.0–46.0)
Hemoglobin: 14 g/dL (ref 12.0–15.0)
MCH: 30.1 pg (ref 26.0–34.0)
MCHC: 34.6 g/dL (ref 30.0–36.0)
MCV: 87.1 fL (ref 78.0–100.0)
Platelets: 148 10*3/uL — ABNORMAL LOW (ref 150–400)
RBC: 4.65 MIL/uL (ref 3.87–5.11)
RDW: 13.1 % (ref 11.5–15.5)
WBC: 9.7 10*3/uL (ref 4.0–10.5)

## 2017-05-30 LAB — TROPONIN I: Troponin I: 0.04 ng/mL (ref <0.03)

## 2017-05-30 MED ORDER — MORPHINE SULFATE (PF) 2 MG/ML IV SOLN
2.0000 mg | Freq: Once | INTRAVENOUS | Status: AC
  Administered 2017-05-30: 2 mg via INTRAVENOUS
  Filled 2017-05-30: qty 1

## 2017-05-30 MED ORDER — NITROGLYCERIN 2 % TD OINT
1.0000 [in_us] | TOPICAL_OINTMENT | Freq: Once | TRANSDERMAL | Status: AC
  Administered 2017-05-30: 1 [in_us] via TOPICAL
  Filled 2017-05-30: qty 1

## 2017-05-30 NOTE — ED Notes (Signed)
Patient transported to X-ray 

## 2017-05-30 NOTE — ED Triage Notes (Signed)
Pt c/o central chest pain that started today with nausea.

## 2017-05-30 NOTE — ED Provider Notes (Signed)
[facility] DEPT Provider Note   CSN: [id number] Arrival date & time: 05/30/17  2202     History   Chief Complaint Chief Complaint  Patient presents with  . Chest Pain    HPI Donna Howe is a 61 y.o. female.  Patient is a 61 year old female with extensive past medical history including coronary artery disease with CABG in 2005, stents in 2006,chronic renal insufficiency, hypertension, diabetes, and peripheral vascular disease with carotid endarterectomy. She presents today for evaluation of chest discomfort. This started today at home while at rest. She describes it as a heaviness to the front of her chest and also describes an aching in her left arm. She denies shortness of breath but does feel somewhat nauseated. She took sublingual nitroglycerin at home with no relief. She is followed by Dr. Domenic Polite from cardiology. She reports having a stress test in the past 2 years that was reported as normal.   The history is provided by the patient.  Chest Pain   This is a new problem. The current episode started 3 to 5 hours ago. The problem occurs constantly. The problem has not changed since onset.The pain is present in the substernal region. The pain is at a severity of 5/10. The pain is moderate. The pain radiates to the left arm. Associated symptoms include nausea. Pertinent negatives include no fever, no palpitations and no shortness of breath. She has tried nitroglycerin for the symptoms. The treatment provided no relief.  Her past medical history is significant for CAD and PVD.    Past Medical History:  Diagnosis Date  . Anemia   . Asthma   . CAD (coronary artery disease)    Multivessel s/p CABG, occluded SVG to OM, DES to LM/circ 12/2012  . Carotid artery disease ([place])   . Chronic kidney disease (CKD), stage III (moderate)   . Essential hypertension   . Gout   . Hyperlipidemia   . Myocardial infarction ([place])   . PAD (peripheral artery disease) ([place])    Dr. Kellie Simmering  .  SBO (small bowel obstruction) ([place]) 2011   Status post lysis of adhesions & hernia repair  . Thrombocytopenia, unspecified ([place])   . Type 2 diabetes mellitus ([place])   . Umbilical hernia     Patient Active Problem List   Diagnosis Date Noted  . Diabetes mellitus ([place]) 10/28/2013  . Hypoglycemia associated with diabetes ([place]) 10/28/2013  . Thrombocytopenia, unspecified ([place]) 10/28/2013  . Hypokalemia 10/27/2013  . Syncope 10/26/2013  . Anemia 10/26/2013  . Renal failure, acute on chronic (HCC) 10/26/2013  . Fracture of toe of right foot 10/26/2013  . Umbilical hernia   . Carotid artery disease ([place])   . Occlusion and stenosis of carotid artery without mention of cerebral infarction 04/15/2013  . Hx of CABG   . Ejection fraction   . Atherosclerosis of native arteries of the extremities with intermittent claudication 02/11/2013  . Diabetes mellitus with renal manifestation ([place]) 01/21/2013  . Bradycardia 01/03/2013  . Chronic kidney disease (CKD), stage III (moderate)   . Gout   . PROTEINURIA, MILD 01/18/2010  . TOBACCO ABUSE 08/27/2009  . Obesity (BMI 30-39.9) 08/26/2009  . ASTHMA 08/26/2009  . Hyperlipidemia 03/31/2007  . Essential hypertension 03/31/2007  . CAD (coronary artery disease) 03/31/2007    Past Surgical History:  Procedure Laterality Date  . CESAREAN SECTION  1984  . CHOLECYSTECTOMY  2010  . CORONARY ARTERY BYPASS GRAFT  2005  . ENDARTERECTOMY Right 04/18/2013   Procedure: ENDARTERECTOMY CAROTID;  Surgeon: Mal Misty, MD;  Location: [facility];  Service: Vascular;  Laterality: Right;  . HERNIA REPAIR  1989  . Incisional hernia repair x2  03/04/2010   Laparoscopic with 35cm mesh by Dr Ronnald Collum  . LEFT HEART CATHETERIZATION WITH CORONARY ANGIOGRAM N/A 12/19/2012   Procedure: LEFT HEART CATHETERIZATION WITH CORONARY ANGIOGRAM;  Surgeon: Josue Hector, MD;  Location: [facility] CATH LAB;  Service: Cardiovascular;  Laterality: N/A;  . LEFT HEART CATHETERIZATION WITH  CORONARY/GRAFT ANGIOGRAM N/A 04/19/2013   Procedure: LEFT HEART CATHETERIZATION WITH Beatrix Fetters;  Surgeon: Lorretta Harp, MD;  Location: [facility] CATH LAB;  Service: Cardiovascular;  Laterality: N/A;  . PATCH ANGIOPLASTY Right 04/18/2013   Procedure: PATCH ANGIOPLASTY Right Internal Carotid Artery;  Surgeon: Mal Misty, MD;  Location: [facility];  Service: Vascular;  Laterality: Right;  . PERCUTANEOUS CORONARY STENT INTERVENTION (PCI-S) Right 12/19/2012   Procedure: PERCUTANEOUS CORONARY STENT INTERVENTION (PCI-S);  Surgeon: Josue Hector, MD;  Location: [facility] CATH LAB;  Service: Cardiovascular;  Laterality: Right;  . SHOULDER SURGERY      OB History    No data available       Home Medications    Prior to Admission medications   Medication Sig Start Date End Date Taking? Authorizing Provider  ALPRAZolam Duanne Moron) 0.5 MG tablet Take 1/2 tab by mouth every morning & evening and 1 tab at bedtime    [provider]  aspirin EC 81 MG tablet Take 81 mg by mouth daily.    [provider]  atorvastatin (LIPITOR) 40 MG tablet Take 1 tablet (40 mg total) by mouth daily. 09/16/15   Satira Sark, MD  clopidogrel (PLAVIX) 75 MG tablet Take 1 tablet (75 mg total) by mouth daily. 12/20/12   Barrett, Evelene Croon, PA-C  colchicine 0.6 MG tablet Take 0.6 mg by mouth daily.     [provider]  gabapentin (NEURONTIN) 400 MG capsule Take 400 mg by mouth 2 (two) times daily.     Rory Percy, MD  isosorbide dinitrate (ISORDIL) 20 MG tablet Take 20 mg by mouth daily.    [provider]  levothyroxine (SYNTHROID, LEVOTHROID) 125 MCG tablet Take 125 mcg by mouth every morning.    [provider]  lisinopril (PRINIVIL,ZESTRIL) 20 MG tablet Take 1 tablet (20 mg total) by mouth daily. 06/29/16   Satira Sark, MD  metoprolol tartrate (LOPRESSOR) 25 MG tablet Take 12.5 mg by mouth 2 (two) times daily.    [provider]  nitroGLYCERIN (NITROSTAT) 0.4 MG  SL tablet Place 0.4 mg under the tongue every 5 (five) minutes as needed for chest pain. Not to exceed 3 in 15 minute time frame    [provider]  NOVOLIN 70/30 RELION (70-30) 100 UNIT/ML injection Inject 20 Units into the skin 3 (three) times daily. 08/28/15   [provider]  ondansetron (ZOFRAN ODT) 8 MG disintegrating tablet Take 1 tablet (8 mg total) by mouth every 8 (eight) hours as needed for nausea or vomiting. 08/14/15   Evalee Jefferson, PA-C  oxyCODONE-acetaminophen (PERCOCET) 10-325 MG per tablet Take 1 tablet by mouth every 6 (six) hours as needed for pain. 04/18/13   Gabriel Earing, PA-C    Family History Family History  Problem Relation Age of Onset  . Diabetes Mother   . Heart disease Mother        before age 47  . Hyperlipidemia Mother   . Hypertension Mother   . Thyroid disease Father   .  Hypertension Father   . AAA (abdominal aortic aneurysm) Father   . Heart disease Brother        before age 42  . Hypertension Brother   . Hyperlipidemia Son   . Hypertension Son     Social History Social History  Substance Use Topics  . Smoking status: Former Smoker    Packs/day: 1.00    Years: 20.00    Types: Cigarettes    Quit date: 12/10/2012  . Smokeless tobacco: Never Used  . Alcohol use No     Allergies   Penicillins   Review of Systems Review of Systems  Constitutional: Negative for fever.  Respiratory: Negative for shortness of breath.   Cardiovascular: Positive for chest pain. Negative for palpitations.  Gastrointestinal: Positive for nausea.  All other systems reviewed and are negative.    Physical Exam Updated Vital Signs BP (!) 156/66   Pulse (!) 50   Temp 98 F (36.7 C)   Resp 17   Ht 5\' 5"  (1.651 m)   Wt 90.3 kg (199 lb)   SpO2 96%   BMI 33.12 kg/m   Physical Exam  Constitutional: She is oriented to person, place, and time. She appears well-developed and well-nourished. No distress.  HENT:  Head: Normocephalic and  atraumatic.  Neck: Normal range of motion. Neck supple.  Cardiovascular: Normal rate and regular rhythm.  Exam reveals no gallop and no friction rub.   No murmur heard. Pulmonary/Chest: Effort normal and breath sounds normal. No respiratory distress. She has no wheezes.  Abdominal: Soft. Bowel sounds are normal. She exhibits no distension. There is no tenderness.  Musculoskeletal: Normal range of motion.  Neurological: She is alert and oriented to person, place, and time.  Skin: Skin is warm and dry. She is not diaphoretic.  Nursing note and vitals reviewed.    ED Treatments / Results  Labs (all labs ordered are listed, but only abnormal results are displayed) Labs Reviewed  BASIC METABOLIC PANEL - Abnormal; Notable for the following:       Result Value   Glucose, Bld 178 (*)    BUN 30 (*)    Creatinine, Ser 1.82 (*)    GFR calc non Af Amer 30 (*)    GFR calc Af Amer 34 (*)    All other components within normal limits  CBC - Abnormal; Notable for the following:    Platelets 148 (*)    All other components within normal limits  TROPONIN I - Abnormal; Notable for the following:    Troponin I 0.04 (*)    All other components within normal limits    EKG  EKG Interpretation  Date/Time:  Wednesday May 30 2017 22:16:18 EDT Ventricular Rate:  54 PR Interval:    QRS Duration: 106 QT Interval:  461 QTC Calculation: 437 R Axis:   76 Text Interpretation:  Sinus rhythm Borderline T abnormalities, lateral leads Baseline wander in lead(s) II III aVL aVF Confirmed by Dorie Rank [phone]) on 05/30/2017 10:23:38 PM       Radiology Dg Chest 2 View  Result Date: 05/30/2017 CLINICAL DATA:  61 y/o  F; chest pain. EXAM: CHEST  2 VIEW COMPARISON:  04/15/2013 chest radiograph FINDINGS: Stable cardiac silhouette, borderline enlarged. No focal consolidation, pneumothorax, or effusion. Status post CABG with sternotomy wires in alignment. Right upper quadrant cholecystectomy clips. No acute  osseous abnormality is evident. IMPRESSION: Stable borderline cardiomegaly. No acute pulmonary process identified. Electronically Signed   By: Edgardo Roys.D.  On: 05/30/2017 22:46    Procedures Procedures (including critical care time)  Medications Ordered in ED Medications  morphine 2 MG/ML injection 2 mg (not administered)  nitroGLYCERIN (NITROGLYN) 2 % ointment 1 inch (not administered)     Initial Impression / Assessment and Plan / ED Course  I have reviewed the triage vital signs and the nursing notes.  Pertinent labs & imaging results that were available during my care of the patient were reviewed by me and considered in my medical decision making (see chart for details).  Patient presenting with chest pain and history of coronary artery disease. Her workup thus far is negative including troponin. Her EKG is unchanged. She does have a very significant cardiac history and I feel will require admission for observation and rule out of MI.I've spoken with Dr. Aggie Moats from the hospitalist service who will evaluate and admit.  Final Clinical Impressions(s) / ED Diagnoses   Final diagnoses:  None    New Prescriptions New Prescriptions   No medications on file     Veryl Speak, MD 05/31/17 [phone]

## 2017-05-31 ENCOUNTER — Encounter (HOSPITAL_COMMUNITY): Payer: Self-pay

## 2017-05-31 ENCOUNTER — Encounter (HOSPITAL_COMMUNITY)
Admission: EM | Disposition: A | Discharge status: Home / Self Care | Payer: Self-pay | Source: Home / Self Care | Attending: Family Medicine

## 2017-05-31 ENCOUNTER — Inpatient Hospital Stay (HOSPITAL_COMMUNITY): Payer: Medicare HMO

## 2017-05-31 DIAGNOSIS — Z8249 Family history of ischemic heart disease and other diseases of the circulatory system: Secondary | ICD-10-CM | POA: Diagnosis not present

## 2017-05-31 DIAGNOSIS — E1151 Type 2 diabetes mellitus with diabetic peripheral angiopathy without gangrene: Secondary | ICD-10-CM | POA: Diagnosis not present

## 2017-05-31 DIAGNOSIS — I249 Acute ischemic heart disease, unspecified: Secondary | ICD-10-CM | POA: Diagnosis not present

## 2017-05-31 DIAGNOSIS — R079 Chest pain, unspecified: Secondary | ICD-10-CM

## 2017-05-31 DIAGNOSIS — E1122 Type 2 diabetes mellitus with diabetic chronic kidney disease: Secondary | ICD-10-CM | POA: Diagnosis not present

## 2017-05-31 DIAGNOSIS — Z7984 Long term (current) use of oral hypoglycemic drugs: Secondary | ICD-10-CM | POA: Diagnosis not present

## 2017-05-31 DIAGNOSIS — I25810 Atherosclerosis of coronary artery bypass graft(s) without angina pectoris: Secondary | ICD-10-CM | POA: Diagnosis not present

## 2017-05-31 DIAGNOSIS — Z87891 Personal history of nicotine dependence: Secondary | ICD-10-CM | POA: Diagnosis not present

## 2017-05-31 DIAGNOSIS — E669 Obesity, unspecified: Secondary | ICD-10-CM | POA: Diagnosis present

## 2017-05-31 DIAGNOSIS — Z8679 Personal history of other diseases of the circulatory system: Secondary | ICD-10-CM

## 2017-05-31 DIAGNOSIS — I2510 Atherosclerotic heart disease of native coronary artery without angina pectoris: Secondary | ICD-10-CM | POA: Diagnosis not present

## 2017-05-31 DIAGNOSIS — Z833 Family history of diabetes mellitus: Secondary | ICD-10-CM | POA: Diagnosis not present

## 2017-05-31 DIAGNOSIS — Z6838 Body mass index (BMI) 38.0-38.9, adult: Secondary | ICD-10-CM | POA: Diagnosis not present

## 2017-05-31 DIAGNOSIS — E1159 Type 2 diabetes mellitus with other circulatory complications: Secondary | ICD-10-CM | POA: Diagnosis not present

## 2017-05-31 DIAGNOSIS — Z955 Presence of coronary angioplasty implant and graft: Secondary | ICD-10-CM | POA: Diagnosis not present

## 2017-05-31 DIAGNOSIS — Z7982 Long term (current) use of aspirin: Secondary | ICD-10-CM | POA: Diagnosis not present

## 2017-05-31 DIAGNOSIS — E782 Mixed hyperlipidemia: Secondary | ICD-10-CM | POA: Diagnosis not present

## 2017-05-31 DIAGNOSIS — I214 Non-ST elevation (NSTEMI) myocardial infarction: Principal | ICD-10-CM

## 2017-05-31 DIAGNOSIS — R001 Bradycardia, unspecified: Secondary | ICD-10-CM | POA: Diagnosis present

## 2017-05-31 DIAGNOSIS — I10 Essential (primary) hypertension: Secondary | ICD-10-CM

## 2017-05-31 DIAGNOSIS — N183 Chronic kidney disease, stage 3 (moderate): Secondary | ICD-10-CM

## 2017-05-31 DIAGNOSIS — E039 Hypothyroidism, unspecified: Secondary | ICD-10-CM | POA: Diagnosis present

## 2017-05-31 DIAGNOSIS — Z794 Long term (current) use of insulin: Secondary | ICD-10-CM | POA: Diagnosis not present

## 2017-05-31 DIAGNOSIS — I129 Hypertensive chronic kidney disease with stage 1 through stage 4 chronic kidney disease, or unspecified chronic kidney disease: Secondary | ICD-10-CM | POA: Diagnosis not present

## 2017-05-31 DIAGNOSIS — Z79899 Other long term (current) drug therapy: Secondary | ICD-10-CM | POA: Diagnosis not present

## 2017-05-31 DIAGNOSIS — I959 Hypotension, unspecified: Secondary | ICD-10-CM | POA: Diagnosis present

## 2017-05-31 DIAGNOSIS — G8929 Other chronic pain: Secondary | ICD-10-CM | POA: Diagnosis present

## 2017-05-31 DIAGNOSIS — Z951 Presence of aortocoronary bypass graft: Secondary | ICD-10-CM | POA: Diagnosis not present

## 2017-05-31 DIAGNOSIS — E785 Hyperlipidemia, unspecified: Secondary | ICD-10-CM | POA: Diagnosis not present

## 2017-05-31 DIAGNOSIS — J45909 Unspecified asthma, uncomplicated: Secondary | ICD-10-CM | POA: Diagnosis not present

## 2017-05-31 DIAGNOSIS — I252 Old myocardial infarction: Secondary | ICD-10-CM | POA: Diagnosis not present

## 2017-05-31 DIAGNOSIS — Z7902 Long term (current) use of antithrombotics/antiplatelets: Secondary | ICD-10-CM | POA: Diagnosis not present

## 2017-05-31 HISTORY — PX: CORONARY BALLOON ANGIOPLASTY: CATH118233

## 2017-05-31 HISTORY — PX: LEFT HEART CATH AND CORS/GRAFTS ANGIOGRAPHY: CATH118250

## 2017-05-31 HISTORY — PX: CORONARY STENT INTERVENTION: CATH118234

## 2017-05-31 LAB — CBC
HCT: 39.3 % (ref 36.0–46.0)
Hemoglobin: 13.3 g/dL (ref 12.0–15.0)
MCH: 29.6 pg (ref 26.0–34.0)
MCHC: 33.8 g/dL (ref 30.0–36.0)
MCV: 87.5 fL (ref 78.0–100.0)
PLATELETS: 134 10*3/uL — AB (ref 150–400)
RBC: 4.49 MIL/uL (ref 3.87–5.11)
RDW: 13.3 % (ref 11.5–15.5)
WBC: 8.7 10*3/uL (ref 4.0–10.5)

## 2017-05-31 LAB — ECHOCARDIOGRAM COMPLETE
Height: 65 in
WEIGHTICAEL: 3671.98 [oz_av]

## 2017-05-31 LAB — LIPID PANEL
Cholesterol: 239 mg/dL — ABNORMAL HIGH (ref 0–200)
HDL: 35 mg/dL — AB (ref >40)
LDL CALC: 165 mg/dL — AB (ref 0–99)
Total CHOL/HDL Ratio: 6.8 RATIO
Triglycerides: 193 mg/dL — ABNORMAL HIGH (ref <150)
VLDL: 39 mg/dL (ref 0–40)

## 2017-05-31 LAB — HEMOGLOBIN A1C
HEMOGLOBIN A1C: 7.2 % — AB (ref 4.8–5.6)
MEAN PLASMA GLUCOSE: 159.94 mg/dL

## 2017-05-31 LAB — BASIC METABOLIC PANEL
ANION GAP: 8 (ref 5–15)
BUN: 30 mg/dL — ABNORMAL HIGH (ref 6–20)
CALCIUM: 9.1 mg/dL (ref 8.9–10.3)
CO2: 28 mmol/L (ref 22–32)
Chloride: 102 mmol/L (ref 101–111)
Creatinine, Ser: 1.74 mg/dL — ABNORMAL HIGH (ref 0.44–1.00)
GFR calc non Af Amer: 31 mL/min — ABNORMAL LOW (ref >60)
GFR, EST AFRICAN AMERICAN: 36 mL/min — AB (ref >60)
Glucose, Bld: 88 mg/dL (ref 65–99)
POTASSIUM: 3.7 mmol/L (ref 3.5–5.1)
SODIUM: 138 mmol/L (ref 135–145)

## 2017-05-31 LAB — GLUCOSE, CAPILLARY
GLUCOSE-CAPILLARY: 139 mg/dL — AB (ref 65–99)
Glucose-Capillary: 87 mg/dL (ref 65–99)
Glucose-Capillary: 151 mg/dL — ABNORMAL HIGH (ref 65–99)
Glucose-Capillary: 114 mg/dL — ABNORMAL HIGH (ref 65–99)

## 2017-05-31 LAB — HEPARIN LEVEL (UNFRACTIONATED)
HEPARIN UNFRACTIONATED: 0.4 [IU]/mL (ref 0.30–0.70)
HEPARIN UNFRACTIONATED: 0.34 [IU]/mL (ref 0.30–0.70)

## 2017-05-31 LAB — TSH: TSH: 9.088 u[IU]/mL — ABNORMAL HIGH (ref 0.350–4.500)

## 2017-05-31 LAB — POCT ACTIVATED CLOTTING TIME
ACTIVATED CLOTTING TIME: 175 s
Activated Clotting Time: 164 seconds
Activated Clotting Time: 180 seconds
Activated Clotting Time: 411 seconds

## 2017-05-31 LAB — TROPONIN I
TROPONIN I: 0.07 ng/mL — AB (ref <0.03)
TROPONIN I: 0.2 ng/mL — AB (ref <0.03)
Troponin I: 0.18 ng/mL (ref <0.03)

## 2017-05-31 LAB — MRSA PCR SCREENING: MRSA BY PCR: NEGATIVE

## 2017-05-31 LAB — PROTIME-INR
INR: 1.03
Prothrombin Time: 13.4 seconds (ref 11.4–15.2)

## 2017-05-31 LAB — APTT: aPTT: 35 seconds (ref 24–36)

## 2017-05-31 SURGERY — LEFT HEART CATH AND CORS/GRAFTS ANGIOGRAPHY
Anesthesia: LOCAL

## 2017-05-31 MED ORDER — LABETALOL HCL 5 MG/ML IV SOLN: 10.0000 mg | INTRAVENOUS | Status: AC | PRN

## 2017-05-31 MED ORDER — MORPHINE SULFATE (PF) 4 MG/ML IV SOLN
2.0000 mg | INTRAVENOUS | Status: DC | PRN
  Administered 2017-05-31 – 2017-06-01 (×2): 2 mg via INTRAVENOUS
  Filled 2017-05-31 (×2): qty 1

## 2017-05-31 MED ORDER — HYDRALAZINE HCL 20 MG/ML IJ SOLN: 5.0000 mg | INTRAMUSCULAR | Status: AC | PRN

## 2017-05-31 MED ORDER — ONDANSETRON HCL 4 MG/2ML IJ SOLN: 4.0000 mg | Freq: Four times a day (QID) | INTRAMUSCULAR | Status: DC | PRN

## 2017-05-31 MED ORDER — ACETAMINOPHEN 325 MG PO TABS: 650.0000 mg | ORAL_TABLET | ORAL | Status: DC | PRN

## 2017-05-31 MED ORDER — SODIUM CHLORIDE 0.9% FLUSH: 3.0000 mL | INTRAVENOUS | Status: DC | PRN

## 2017-05-31 MED ORDER — ATORVASTATIN CALCIUM 80 MG PO TABS
80.0000 mg | ORAL_TABLET | Freq: Every day | ORAL | Status: DC
  Administered 2017-06-01: 80 mg via ORAL
  Filled 2017-05-31: qty 1

## 2017-05-31 MED ORDER — LISINOPRIL 20 MG PO TABS
20.0000 mg | ORAL_TABLET | Freq: Every day | ORAL | Status: DC
  Administered 2017-05-31: 20 mg via ORAL
  Filled 2017-05-31: qty 1

## 2017-05-31 MED ORDER — MORPHINE SULFATE (PF) 4 MG/ML IV SOLN
4.0000 mg | Freq: Once | INTRAVENOUS | Status: AC
  Administered 2017-05-31: 4 mg via INTRAVENOUS
  Filled 2017-05-31: qty 1

## 2017-05-31 MED ORDER — METOPROLOL TARTRATE 12.5 MG HALF TABLET: 12.5000 mg | ORAL_TABLET | Freq: Two times a day (BID) | ORAL | Status: DC

## 2017-05-31 MED ORDER — BIVALIRUDIN TRIFLUOROACETATE 250 MG IV SOLR
INTRAVENOUS | Status: AC
  Filled 2017-05-31: qty 250

## 2017-05-31 MED ORDER — IOPAMIDOL (ISOVUE-370) INJECTION 76%
INTRAVENOUS | Status: AC
  Filled 2017-05-31: qty 125

## 2017-05-31 MED ORDER — INSULIN ASPART PROT & ASPART (70-30 MIX) 100 UNIT/ML ~~LOC~~ SUSP
10.0000 [IU] | Freq: Three times a day (TID) | SUBCUTANEOUS | Status: DC
  Administered 2017-06-01: 10 [IU] via SUBCUTANEOUS
  Filled 2017-05-31: qty 10

## 2017-05-31 MED ORDER — MORPHINE SULFATE (PF) 4 MG/ML IV SOLN: 4.0000 mg | INTRAVENOUS | Status: DC | PRN

## 2017-05-31 MED ORDER — IOPAMIDOL (ISOVUE-370) INJECTION 76%
INTRAVENOUS | Status: DC | PRN
  Administered 2017-05-31: 120 mL via INTRA_ARTERIAL

## 2017-05-31 MED ORDER — MIDAZOLAM HCL 2 MG/2ML IJ SOLN
INTRAMUSCULAR | Status: AC
  Filled 2017-05-31: qty 2

## 2017-05-31 MED ORDER — ASPIRIN 81 MG PO CHEW
81.0000 mg | CHEWABLE_TABLET | Freq: Every day | ORAL | Status: DC
  Administered 2017-06-01: 81 mg via ORAL
  Filled 2017-05-31: qty 1

## 2017-05-31 MED ORDER — SODIUM CHLORIDE 0.9 % IV SOLN: 250.0000 mL | INTRAVENOUS | Status: DC | PRN

## 2017-05-31 MED ORDER — INSULIN ASPART 100 UNIT/ML ~~LOC~~ SOLN
0.0000 [IU] | Freq: Three times a day (TID) | SUBCUTANEOUS | Status: DC
  Administered 2017-06-01: 2 [IU] via SUBCUTANEOUS

## 2017-05-31 MED ORDER — BIVALIRUDIN TRIFLUOROACETATE 250 MG IV SOLR
INTRAVENOUS | Status: DC | PRN
  Administered 2017-05-31: 1.75 mg/kg/h via INTRAVENOUS

## 2017-05-31 MED ORDER — ATORVASTATIN CALCIUM 40 MG PO TABS
40.0000 mg | ORAL_TABLET | Freq: Every day | ORAL | Status: DC
  Administered 2017-05-31: 40 mg via ORAL
  Filled 2017-05-31: qty 1

## 2017-05-31 MED ORDER — HEPARIN (PORCINE) IN NACL 100-0.45 UNIT/ML-% IJ SOLN: 1000.0000 [IU]/h | INTRAMUSCULAR | Status: DC

## 2017-05-31 MED ORDER — ASPIRIN 81 MG PO CHEW: 81.0000 mg | CHEWABLE_TABLET | ORAL | Status: DC

## 2017-05-31 MED ORDER — CLOPIDOGREL BISULFATE 75 MG PO TABS
75.0000 mg | ORAL_TABLET | Freq: Every day | ORAL | Status: DC
  Administered 2017-05-31: 75 mg via ORAL
  Filled 2017-05-31: qty 1

## 2017-05-31 MED ORDER — SODIUM CHLORIDE 0.9 % IV SOLN: INTRAVENOUS | Status: AC

## 2017-05-31 MED ORDER — FENTANYL CITRATE (PF) 100 MCG/2ML IJ SOLN
INTRAMUSCULAR | Status: DC | PRN
  Administered 2017-05-31 (×2): 25 ug via INTRAVENOUS

## 2017-05-31 MED ORDER — MIDAZOLAM HCL 2 MG/2ML IJ SOLN
INTRAMUSCULAR | Status: DC | PRN
  Administered 2017-05-31: 1 mg via INTRAVENOUS
  Administered 2017-05-31: 2 mg via INTRAVENOUS

## 2017-05-31 MED ORDER — BIVALIRUDIN BOLUS VIA INFUSION - CUPID
INTRAVENOUS | Status: DC | PRN
  Administered 2017-05-31: 78.075 mg via INTRAVENOUS

## 2017-05-31 MED ORDER — HEPARIN (PORCINE) IN NACL 2-0.9 UNIT/ML-% IJ SOLN
INTRAMUSCULAR | Status: AC | PRN
Start: 2017-05-31 — End: 2017-05-31
  Administered 2017-05-31: 1000 mL

## 2017-05-31 MED ORDER — IOPAMIDOL (ISOVUE-370) INJECTION 76%
INTRAVENOUS | Status: AC
Start: 2017-05-31 — End: 2017-05-31
  Filled 2017-05-31: qty 50

## 2017-05-31 MED ORDER — LIDOCAINE HCL (PF) 1 % IJ SOLN
INTRAMUSCULAR | Status: AC
  Filled 2017-05-31: qty 30

## 2017-05-31 MED ORDER — ATROPINE SULFATE 1 MG/10ML IJ SOSY
PREFILLED_SYRINGE | INTRAMUSCULAR | Status: AC
  Filled 2017-05-31: qty 10

## 2017-05-31 MED ORDER — ZOLPIDEM TARTRATE 5 MG PO TABS: 5.0000 mg | ORAL_TABLET | Freq: Every evening | ORAL | Status: DC | PRN

## 2017-05-31 MED ORDER — HEPARIN (PORCINE) IN NACL 100-0.45 UNIT/ML-% IJ SOLN
1000.0000 [IU]/h | INTRAMUSCULAR | Status: DC
  Administered 2017-05-31: 1000 [IU]/h via INTRAVENOUS
  Filled 2017-05-31: qty 250

## 2017-05-31 MED ORDER — ADENOSINE (DIAGNOSTIC) FOR INTRACORONARY USE
INTRAVENOUS | Status: DC | PRN
  Administered 2017-05-31 (×4): 30 ug via INTRACORONARY

## 2017-05-31 MED ORDER — HEPARIN (PORCINE) IN NACL 2-0.9 UNIT/ML-% IJ SOLN
INTRAMUSCULAR | Status: AC
  Filled 2017-05-31: qty 1000

## 2017-05-31 MED ORDER — LEVOTHYROXINE SODIUM 25 MCG PO TABS
125.0000 ug | ORAL_TABLET | Freq: Every day | ORAL | Status: DC
  Administered 2017-05-31 – 2017-06-01 (×2): 125 ug via ORAL
  Filled 2017-05-31: qty 3
  Filled 2017-05-31: qty 1

## 2017-05-31 MED ORDER — GABAPENTIN 400 MG PO CAPS
400.0000 mg | ORAL_CAPSULE | Freq: Two times a day (BID) | ORAL | Status: DC
  Administered 2017-05-31 – 2017-06-01 (×3): 400 mg via ORAL
  Filled 2017-05-31 (×3): qty 1

## 2017-05-31 MED ORDER — SODIUM CHLORIDE 0.9 % IV BOLUS (SEPSIS)
200.0000 mL | Freq: Once | INTRAVENOUS | Status: AC
  Administered 2017-05-31: 200 mL via INTRAVENOUS

## 2017-05-31 MED ORDER — LIDOCAINE HCL (PF) 1 % IJ SOLN
INTRAMUSCULAR | Status: DC | PRN
  Administered 2017-05-31: 5 mL

## 2017-05-31 MED ORDER — ASPIRIN 81 MG PO CHEW
243.0000 mg | CHEWABLE_TABLET | Freq: Once | ORAL | Status: AC
  Administered 2017-05-31: 243 mg via ORAL
  Filled 2017-05-31: qty 3

## 2017-05-31 MED ORDER — CLOPIDOGREL BISULFATE 300 MG PO TABS
ORAL_TABLET | ORAL | Status: DC | PRN
  Administered 2017-05-31: 300 mg via ORAL

## 2017-05-31 MED ORDER — ASPIRIN EC 325 MG PO TBEC
325.0000 mg | DELAYED_RELEASE_TABLET | Freq: Every day | ORAL | Status: DC
  Administered 2017-05-31: 325 mg via ORAL
  Filled 2017-05-31: qty 1

## 2017-05-31 MED ORDER — SODIUM CHLORIDE 0.9 % IV SOLN: INTRAVENOUS | Status: DC

## 2017-05-31 MED ORDER — SODIUM CHLORIDE 0.9% FLUSH
3.0000 mL | Freq: Two times a day (BID) | INTRAVENOUS | Status: DC
Start: 2017-05-31 — End: 2017-06-01
  Administered 2017-06-01: 3 mL via INTRAVENOUS

## 2017-05-31 MED ORDER — HYDRALAZINE HCL 20 MG/ML IJ SOLN: 10.0000 mg | Freq: Three times a day (TID) | INTRAMUSCULAR | Status: DC | PRN

## 2017-05-31 MED ORDER — CLOPIDOGREL BISULFATE 75 MG PO TABS
75.0000 mg | ORAL_TABLET | Freq: Every day | ORAL | Status: DC
  Administered 2017-06-01: 75 mg via ORAL
  Filled 2017-05-31: qty 1

## 2017-05-31 MED ORDER — NITROGLYCERIN IN D5W 200-5 MCG/ML-% IV SOLN
0.0000 ug/min | INTRAVENOUS | Status: DC
  Administered 2017-05-31: 5 ug/min via INTRAVENOUS
  Filled 2017-05-31: qty 250

## 2017-05-31 MED ORDER — FENTANYL CITRATE (PF) 100 MCG/2ML IJ SOLN
INTRAMUSCULAR | Status: AC
  Filled 2017-05-31: qty 2

## 2017-05-31 MED ORDER — SODIUM CHLORIDE 0.9% FLUSH: 3.0000 mL | Freq: Two times a day (BID) | INTRAVENOUS | Status: DC

## 2017-05-31 MED ORDER — HEPARIN BOLUS VIA INFUSION
4000.0000 [IU] | Freq: Once | INTRAVENOUS | Status: AC
  Administered 2017-05-31: 4000 [IU] via INTRAVENOUS

## 2017-05-31 MED ORDER — VERAPAMIL HCL 2.5 MG/ML IV SOLN
INTRAVENOUS | Status: AC
  Filled 2017-05-31: qty 2

## 2017-05-31 MED ORDER — SODIUM CHLORIDE 0.9 % IV SOLN
INTRAVENOUS | Status: DC
  Administered 2017-05-31: 15:00:00 via INTRAVENOUS

## 2017-05-31 MED ORDER — ACETAMINOPHEN 325 MG PO TABS
650.0000 mg | ORAL_TABLET | ORAL | Status: DC | PRN
  Administered 2017-05-31: 650 mg via ORAL
  Filled 2017-05-31: qty 2

## 2017-05-31 SURGICAL SUPPLY — 20 items
BALLN WOLVERINE 3.00X10 (BALLOONS) ×2
BALLOON WOLVERINE 3.00X10 (BALLOONS) ×1 IMPLANT
CATH EXPO 5F MPA-1 (CATHETERS) ×2 IMPLANT
CATH INFINITI 5 FR RCB (CATHETERS) ×2 IMPLANT
CATH INFINITI 5FR MULTPACK ANG (CATHETERS) ×2 IMPLANT
CATH LAUNCHER 6FR EBU3.5 (CATHETERS) ×2 IMPLANT
CATHETER LAUNCHER 6FR MP1 (CATHETERS) ×2 IMPLANT
COVER PRB 48X5XTLSCP FOLD TPE (BAG) ×1 IMPLANT
COVER PROBE 5X48 (BAG) ×1
KIT ENCORE 26 ADVANTAGE (KITS) ×4 IMPLANT
KIT HEART LEFT (KITS) ×2 IMPLANT
PACK CARDIAC CATHETERIZATION (CUSTOM PROCEDURE TRAY) ×2 IMPLANT
SHEATH PINNACLE 5F 10CM (SHEATH) ×2 IMPLANT
SHEATH PINNACLE 6F 10CM (SHEATH) ×2 IMPLANT
STENT RESOLUTE ONYX 3.5X12 (Permanent Stent) ×2 IMPLANT
TRANSDUCER W/STOPCOCK (MISCELLANEOUS) ×2 IMPLANT
TUBING CIL FLEX 10 FLL-RA (TUBING) ×2 IMPLANT
VALVE GUARDIAN II ~~LOC~~ HEMO (MISCELLANEOUS) ×4 IMPLANT
WIRE ASAHI PROWATER 180CM (WIRE) ×2 IMPLANT
WIRE EMERALD 3MM-J .035X150CM (WIRE) ×2 IMPLANT

## 2017-05-31 NOTE — H&P (Signed)
[facility] History and Physical  [name] BCW:[id number] DOB: 12-31-1955 DOA: 05/30/2017  Referring physician:  PCP: Rory Percy, MD   Chief Complaint: "It felt like someone was sitting here."  HPI: Donna Howe is a 61 y.o. female  past medical history is noted for asthma, anemia, coronary artery disease, hypertension, gout, myocardial infarction, bowel obstruction who presents with chest discomfort. Patient states she had chest discomfort started late afternoon.  Arises or pressure over the mid chest with some pain radiating to the left chest and down the left arm. Patient did have some nausea with this and was diaphoretic. Pain didn't respond to nitroglycerin without completely going away. Patient took MetroLotion 2 at home as well as 81 mg aspirin. Patient then made her way to emergency room for evaluation.  Denies chest wall trauma, coughing and chest strain.  ED course: Patient was given Nitropaste and morphine which helped her pain. Troponin weakly positive. Hospitalist consulted for admission.   Review of Systems:  As per HPI otherwise 10 point review of systems negative.    Past Medical History:  Diagnosis Date  . Anemia   . Asthma   . CAD (coronary artery disease)    Multivessel s/p CABG, occluded SVG to OM, DES to LM/circ 12/2012  . Carotid artery disease ([place])   . Chronic kidney disease (CKD), stage III (moderate)   . Essential hypertension   . Gout   . Hyperlipidemia   . Myocardial infarction ([place])   . PAD (peripheral artery disease) ([place])    Dr. Kellie Simmering  . SBO (small bowel obstruction) ([place]) 2011   Status post lysis of adhesions & hernia repair  . Thrombocytopenia, unspecified ([place])   . Type 2 diabetes mellitus ([place])   . Umbilical hernia    Past Surgical History:  Procedure Laterality Date  . CESAREAN SECTION  1984  . CHOLECYSTECTOMY  2010  . CORONARY ARTERY BYPASS GRAFT  2005  . ENDARTERECTOMY Right 04/18/2013   Procedure:  ENDARTERECTOMY CAROTID;  Surgeon: Mal Misty, MD;  Location: [facility];  Service: Vascular;  Laterality: Right;  . HERNIA REPAIR  1989  . Incisional hernia repair x2  03/04/2010   Laparoscopic with 35cm mesh by Dr Ronnald Collum  . LEFT HEART CATHETERIZATION WITH CORONARY ANGIOGRAM N/A 12/19/2012   Procedure: LEFT HEART CATHETERIZATION WITH CORONARY ANGIOGRAM;  Surgeon: Josue Hector, MD;  Location: [facility] CATH LAB;  Service: Cardiovascular;  Laterality: N/A;  . LEFT HEART CATHETERIZATION WITH CORONARY/GRAFT ANGIOGRAM N/A 04/19/2013   Procedure: LEFT HEART CATHETERIZATION WITH Beatrix Fetters;  Surgeon: Lorretta Harp, MD;  Location: [facility] CATH LAB;  Service: Cardiovascular;  Laterality: N/A;  . PATCH ANGIOPLASTY Right 04/18/2013   Procedure: PATCH ANGIOPLASTY Right Internal Carotid Artery;  Surgeon: Mal Misty, MD;  Location: [facility];  Service: Vascular;  Laterality: Right;  . PERCUTANEOUS CORONARY STENT INTERVENTION (PCI-S) Right 12/19/2012   Procedure: PERCUTANEOUS CORONARY STENT INTERVENTION (PCI-S);  Surgeon: Josue Hector, MD;  Location: [facility] CATH LAB;  Service: Cardiovascular;  Laterality: Right;  . SHOULDER SURGERY     Social History:  reports that she quit smoking about 4 years ago. Her smoking use included Cigarettes. She has a 20.00 pack-year smoking history. She has never used smokeless tobacco. She reports that she does not drink alcohol or use drugs.  Allergies  Allergen Reactions  . Penicillins Other (See Comments)    REACTION: Unknown, told as a child    Family History  Problem Relation Age of Onset  .  Diabetes Mother   . Heart disease Mother        before age 64  . Hyperlipidemia Mother   . Hypertension Mother   . Thyroid disease Father   . Hypertension Father   . AAA (abdominal aortic aneurysm) Father   . Heart disease Brother        before age 93  . Hypertension Brother   . Hyperlipidemia Son   . Hypertension Son      Prior to Admission medications   Medication  Sig Start Date End Date Taking? Authorizing Provider  ALPRAZolam Duanne Moron) 0.5 MG tablet Take 1/2 tab by mouth every morning & evening and 1 tab at bedtime    [provider]  aspirin EC 81 MG tablet Take 81 mg by mouth daily.    [provider]  atorvastatin (LIPITOR) 40 MG tablet Take 1 tablet (40 mg total) by mouth daily. 09/16/15   Satira Sark, MD  clopidogrel (PLAVIX) 75 MG tablet Take 1 tablet (75 mg total) by mouth daily. 12/20/12   Barrett, Evelene Croon, PA-C  colchicine 0.6 MG tablet Take 0.6 mg by mouth daily.     [provider]  gabapentin (NEURONTIN) 400 MG capsule Take 400 mg by mouth 2 (two) times daily.     Rory Percy, MD  isosorbide dinitrate (ISORDIL) 20 MG tablet Take 20 mg by mouth daily.    [provider]  levothyroxine (SYNTHROID, LEVOTHROID) 125 MCG tablet Take 125 mcg by mouth every morning.    [provider]  lisinopril (PRINIVIL,ZESTRIL) 20 MG tablet Take 1 tablet (20 mg total) by mouth daily. 06/29/16   Satira Sark, MD  metoprolol tartrate (LOPRESSOR) 25 MG tablet Take 12.5 mg by mouth 2 (two) times daily.    [provider]  nitroGLYCERIN (NITROSTAT) 0.4 MG SL tablet Place 0.4 mg under the tongue every 5 (five) minutes as needed for chest pain. Not to exceed 3 in 15 minute time frame    [provider]  NOVOLIN 70/30 RELION (70-30) 100 UNIT/ML injection Inject 20 Units into the skin 3 (three) times daily. 08/28/15   [provider]  ondansetron (ZOFRAN ODT) 8 MG disintegrating tablet Take 1 tablet (8 mg total) by mouth every 8 (eight) hours as needed for nausea or vomiting. 08/14/15   Evalee Jefferson, PA-C  oxyCODONE-acetaminophen (PERCOCET) 10-325 MG per tablet Take 1 tablet by mouth every 6 (six) hours as needed for pain. 04/18/13   Gabriel Earing, PA-C   Physical Exam: Vitals:   05/30/17 2212 05/30/17 2245 05/30/17 2330 05/31/17 0000  BP: (!) 201/54 (!) 156/66 (!) 167/67 (!) 158/64    Pulse: (!) 53 (!) 50 (!) 54 (!) 51  Resp: 20 17 16 19   Temp: 98 F (36.7 C)     SpO2: 98% 96% 96% 97%  Weight:      Height:        Wt Readings from Last 3 Encounters:  05/30/17 90.3 kg (199 lb)  12/01/16 91 kg (200 lb 9.6 oz)  06/29/16 91.2 kg (201 lb)    General:  Appears calm and comfortable; A&Ox3 Eyes:  PERRL, EOMI, normal lids, iris ENT:  grossly normal hearing, lips & tongue Neck:  no LAD, masses or thyromegaly Cardiovascular:  RRR, no m/r/g. No LE edema. No chest wall tenderness. Respiratory:  CTA bilaterally, no w/r/r. Normal respiratory effort. Abdomen:  soft, ntnd Skin:  no rash or induration seen on limited exam Musculoskeletal:  grossly normal tone BUE/BLE  Psychiatric:  grossly normal mood and affect, speech fluent and appropriate Neurologic:  CN 2-12 grossly intact, moves all extremities in coordinated fashion.          Labs on Admission:  Basic Metabolic Panel:  Recent Labs Lab 05/30/17 2217  NA 136  K 4.2  CL 102  CO2 25  GLUCOSE 178*  BUN 30*  CREATININE 1.82*  CALCIUM 9.6   Liver Function Tests: No results for input(s): AST, ALT, ALKPHOS, BILITOT, PROT, ALBUMIN in the last 168 hours. No results for input(s): LIPASE, AMYLASE in the last 168 hours. No results for input(s): AMMONIA in the last 168 hours. CBC:  Recent Labs Lab 05/30/17 2217  WBC 9.7  HGB 14.0  HCT 40.5  MCV 87.1  PLT 148*   Cardiac Enzymes:  Recent Labs Lab 05/30/17 2217  TROPONINI 0.04*    BNP (last 3 results) No results for input(s): BNP in the last 8760 hours.  ProBNP (last 3 results) No results for input(s): PROBNP in the last 8760 hours.   Serum creatinine: 1.82 mg/dL (H) 05/30/17 2217 Estimated creatinine clearance: 37.4 mL/min (A)  CBG: No results for input(s): GLUCAP in the last 168 hours.  Radiological Exams on Admission: Dg Chest 2 View  Result Date: 05/30/2017 CLINICAL DATA:  61 y/o  F; chest pain. EXAM: CHEST  2 VIEW COMPARISON:  04/15/2013  chest radiograph FINDINGS: Stable cardiac silhouette, borderline enlarged. No focal consolidation, pneumothorax, or effusion. Status post CABG with sternotomy wires in alignment. Right upper quadrant cholecystectomy clips. No acute osseous abnormality is evident. IMPRESSION: Stable borderline cardiomegaly. No acute pulmonary process identified. Electronically Signed   By: Kristine Garbe M.D.   On: 05/30/2017 22:46    EKG: Independently reviewed. Bradycardia. No STEMI.  Assessment/Plan Principal Problem:   ACS (acute coronary syndrome) (HCC) Active Problems:   Hyperlipidemia   Essential hypertension   Asthma   Chronic kidney disease (CKD), stage III (moderate)   Diabetes mellitus ([place])  1) CP/ACS Given hear score of 6 in ED. - serial trop ordered, initial pos, will trend - prn EKG CP - prn moprhine CP - ntg paste - asa in ED and QD - ECHO ordered for AM - sdubed, cardiac monitoring - ambien for sleep prn - zofran prn for nausea - heparin drip  Hypertension When necessary hydralazine 10 mg IV as needed for severe blood pressure Cont lisinopril, Lopressor  HLD Continue statin  Diabetes 70/30 3 times a day 10 units, from 20u Findings scale insulin  Hypothyroidism Cont OP synthroid 125 mcg qd No signs of hyper or hypothyroidism  CAD Continue Plavix  CKD Monitor Cr daily Cr at baseline,  1.5-1.9  Chronic pain Continue gabapentin  Code Status: FULL DVT Prophylaxis: heparin Family Communication: pt to contact son Disposition Plan: Pending Improvement  Status: inpt tele  Elwin Mocha, MD Family Medicine [facility] [email or web address] Password [email or web address]

## 2017-05-31 NOTE — Progress Notes (Signed)
CRITICAL VALUE ALERT  Critical Value:  Troponin 0.18  Date & Time Notied:  05/31/2017 1418  Provider Notified: Daune Perch NP  Orders Received/Actions taken:No new orders at this time

## 2017-05-31 NOTE — Progress Notes (Addendum)
ANTICOAGULATION CONSULT NOTE - Follow Up Consult  Pharmacy Consult for heparin Indication: chest pain/ACS  Allergies  Allergen Reactions  . Penicillins Other (See Comments)    REACTION: Unknown, told as a child    Patient Measurements: Height: 5\' 5"  (165.1 cm) Weight: 229 lb 8 oz (104.1 kg) IBW/kg (Calculated) : 57 Heparin Dosing Weight: 81 kg  Vital Signs: Temp: 97.5 F (36.4 C) (09/13 0630) Temp Source: Oral (09/13 0630) BP: 149/69 (09/13 0700) Pulse Rate: 49 (09/13 0800)  Labs:  Recent Labs  05/30/17 2217 05/31/17 0500 05/31/17 0743  HGB 14.0  --  13.3  HCT 40.5  --  39.3  PLT 148*  --  134*  APTT 35  --   --   LABPROT 13.4  --   --   INR 1.03  --   --   HEPARINUNFRC  --   --  0.40  CREATININE 1.82* 1.74*  --   TROPONINI 0.04* 0.07*  --     Estimated Creatinine Clearance: 42.2 mL/min (A) (by C-G formula based on SCr of 1.74 mg/dL (H)).   Medications:  Heparin 1000 units/hr  Assessment: 22 yoF with PMH of known CAD presents with CP and mildly elevated troponins started on IV heparin per pharmacy. Initial heparin level therapeutic at 0.40, CBC stable and wnl.  Goal of Therapy:  Heparin level 0.3-0.7 units/ml Monitor platelets by anticoagulation protocol: Yes   Plan:  -Continue heparin 1000 units/hr -Check 6-hr confirmatory heparin level -Monitor heparin level, CBC, S/Sx bleeding daily  ADDENDUM: Confirmatory heparin level therapeutic at 0.34  Plan: -Continue heparin 1000 units/hr -Daily heparin level  Arrie Senate, PharmD PGY-2 Cardiology Pharmacy Resident Pager: [phone] 05/31/2017

## 2017-05-31 NOTE — Progress Notes (Signed)
ANTICOAGULATION CONSULT NOTE - Preliminary  Pharmacy Consult for heparin Indication: chest pain/ACS  Allergies  Allergen Reactions  . Penicillins Other (See Comments)    REACTION: Unknown, told as a child    Patient Measurements: Height: 5\' 5"  (165.1 cm) Weight: 199 lb (90.3 kg) IBW/kg (Calculated) : 57 HEPARIN DW (KG): 77   Vital Signs: Temp: 98 F (36.7 C) (09/12 2212) BP: 188/77 (09/13 0033) Pulse Rate: 61 (09/13 0033)  Labs:  Recent Labs  05/30/17 2217  HGB 14.0  HCT 40.5  PLT 148*  CREATININE 1.82*  TROPONINI 0.04*   Estimated Creatinine Clearance: 37.4 mL/min (A) (by C-G formula based on SCr of 1.82 mg/dL (H)).  Medical History: Past Medical History:  Diagnosis Date  . Anemia   . Asthma   . CAD (coronary artery disease)    Multivessel s/p CABG, occluded SVG to OM, DES to LM/circ 12/2012  . Carotid artery disease ([place])   . Chronic kidney disease (CKD), stage III (moderate)   . Essential hypertension   . Gout   . Hyperlipidemia   . Myocardial infarction ([place])   . PAD (peripheral artery disease) ([place])    Dr. Kellie Simmering  . SBO (small bowel obstruction) ([place]) 2011   Status post lysis of adhesions & hernia repair  . Thrombocytopenia, unspecified ([place])   . Type 2 diabetes mellitus ([place])   . Umbilical hernia     Medications:   (Not in a hospital admission) Scheduled:  . aspirin EC  325 mg Oral Daily  . atorvastatin  40 mg Oral Daily  . clopidogrel  75 mg Oral Daily  . gabapentin  400 mg Oral BID  . heparin  4,000 Units Intravenous Once  . insulin aspart  0-15 Units Subcutaneous TID WC  . insulin aspart protamine- aspart  10 Units Subcutaneous TID  . levothyroxine  125 mcg Oral [id number]  . lisinopril  20 mg Oral Daily  . metoprolol tartrate  12.5 mg Oral BID   Infusions:  . heparin      Assessment: 61 yo female with extensive PMH: CAD, CABG 2005, stents 2006, CKD, HTN, DM, PVD. In ED and being admitted for chest pain radiating to left arm and  nausea.    Goal of Therapy:  Heparin level 0.3-0.7 units/ml   Plan:  Give 4000 units bolus x 1 Start heparin infusion at 1000 units/hr Check anti-Xa level in 6 hours and daily while on heparin Continue to monitor H&H and platelets Preliminary review of pertinent patient information completed.  Forestine Na clinical pharmacist will complete review during morning rounds to assess the patient and finalize treatment regimen.  Talajah Slimp, [place], RPH 05/31/2017,1:15 AM

## 2017-05-31 NOTE — Consult Note (Addendum)
Cardiology Consultation:   Patient ID: INDIA[place] GAMERO; [id number]; [birth year]   Admit date: 05/30/2017 Date of Consult: 05/31/2017  Primary Care Provider: Rory Percy, MD Primary Cardiologist: Dr. Rozann Lesches   Patient Profile:   Chelan Heringer Accardi is a 61 y.o. female with a hx of Asthma, anemia, CAD status post CABG 2005, hypertension, gout, bowel obstruction CK D stage III, PAD, type 2 diabetes and carotid artery disease s/p R CEA who is being seen today for the evaluation of chest pain, elevated troponin, question of ACS at the request of Dr. Wynetta Emery.  History of Present Illness:   Ms. Anagnos presented to [facility] with complaints of chest pain. Her symptoms were typical and her troponin was elevated at 0.04. She was transferred to [facility] for further cardiac evaluation and treatment.  The patient was in her usual state of health and doing her usual activities without any exertional type symptoms. Yesterday evening while sitting in a chair she developed a heavy pressure in the chest that moved to her left arm. She did not have frank shortness of breath but seemed like her breathing was tight. She denies lightheadedness, nausea, diaphoresis, syncope. She tried sublingual nitroglycerin 2 without any relief. The discomfort lasted for about 30-45 minutes and resolved on its own. She is then went to prepare dinner and she developed a second episode of chest pressure while she was cooking. She then had her son drive her to [facility]. This discomfort persisted until after Nitropaste was applied and morphine given and then her symptoms resolved after about 30 minutes. She has had no further chest discomfort since treatment in the emergency department. She denies any orthopnea, PND, edema. She has occasional swelling of the right leg where her vein was harvested, but none at present. She reports being under quite a bit of stress taking care of her elderly mother  and her boyfriend who is in a nursing home. She has been compliant with all of her medications.  The patient had typical chest pain and mildly elevated troponins and 2014. A cardiac catheterization was done with no identifiable culprit lesion. She had low risk Myoveiw in 09/2015. She was treated medically. She was a previous smoker having quit in 2014.  The patient was last seen in the cardiology office by Dr. Domenic Polite on 12/01/16 at which time the patient was stable on medical therapy without angina symptoms. He noted her history of multivessel CAD status post CABG in 2005. The patient has graft disease with known occluded SVG to the OM/ramus. He noted that a Cardiac CT done at [facility] last year indicated that the stent site within the left main and circumflex was patent, there was moderate to severe stenosis of the ostial LAD with patent LIMA to LAD, subtotal occlusion of an OM/ramus branch with stent visualized in the mid to distal portion of the vessel, diffusely diseased RCA with patent SVG to RCA with proximal 40-50% stenosis, andan occluded SVG was also noted (likely to OM). This fits with the patient's known coronary anatomy based on last PCI in 2014 at which time she had an occluded SVG to OM.  Significant findings include: Troponins 0.04, 0.07, 0.18 EKG shows sinus bradycardia with nonspecific ST changes in the lateral leads Chest x-ray shows stable borderline cardiomegaly and no acute pulmonary process SCr 1.82 -> 1.74 K+  3.7 Hgb  13.3 LDL  165,  TC 239,  HDL 35 INR 1.03 Hgb A1c 7.2 TSH  9.088  Echocardiogram 05/13/2016: Mild concentric LVH, EF 50-55%, normal wall motion, grade 1 diastolic dysfunction, no significant valve stenosis or regurgitation (done at [facility] in [place])   Past Medical History:  Diagnosis Date  . Anemia   . Asthma   . CAD (coronary artery disease)    Multivessel s/p CABG, occluded SVG to OM, DES to LM/circ 12/2012  .  Carotid artery disease ([place])   . Chronic kidney disease (CKD), stage III (moderate)   . Essential hypertension   . Gout   . Hyperlipidemia   . Hypothyroidism   . Myocardial infarction ([place])   . PAD (peripheral artery disease) ([place])    Dr. Kellie Simmering  . SBO (small bowel obstruction) ([place]) 2011   Status post lysis of adhesions & hernia repair  . Thrombocytopenia, unspecified ([place])   . Type 2 diabetes mellitus ([place])   . Umbilical hernia     Past Surgical History:  Procedure Laterality Date  . CESAREAN SECTION  1984  . CHOLECYSTECTOMY  2010  . CORONARY ARTERY BYPASS GRAFT  2005  . ENDARTERECTOMY Right 04/18/2013   Procedure: ENDARTERECTOMY CAROTID;  Surgeon: Mal Misty, MD;  Location: [place];  Service: Vascular;  Laterality: Right;  . HERNIA REPAIR  1989  . Incisional hernia repair x2  03/04/2010   Laparoscopic with 35cm mesh by Dr Ronnald Collum  . LEFT HEART CATHETERIZATION WITH CORONARY ANGIOGRAM N/A 12/19/2012   Procedure: LEFT HEART CATHETERIZATION WITH CORONARY ANGIOGRAM;  Surgeon: Josue Hector, MD;  Location: [facility] CATH LAB;  Service: Cardiovascular;  Laterality: N/A;  . LEFT HEART CATHETERIZATION WITH CORONARY/GRAFT ANGIOGRAM N/A 04/19/2013   Procedure: LEFT HEART CATHETERIZATION WITH Beatrix Fetters;  Surgeon: Lorretta Harp, MD;  Location: [facility] CATH LAB;  Service: Cardiovascular;  Laterality: N/A;  . PATCH ANGIOPLASTY Right 04/18/2013   Procedure: PATCH ANGIOPLASTY Right Internal Carotid Artery;  Surgeon: Mal Misty, MD;  Location: [place];  Service: Vascular;  Laterality: Right;  . PERCUTANEOUS CORONARY STENT INTERVENTION (PCI-S) Right 12/19/2012   Procedure: PERCUTANEOUS CORONARY STENT INTERVENTION (PCI-S);  Surgeon: Josue Hector, MD;  Location: [facility] C[facility] LAB;  Service: Cardiovascular;  Laterality: Right;  . SHOULDER SURGERY       Home Medications:  Prior to Admission medications   Medication Sig Start Date End Date Taking? Authorizing Provider  ALPRAZolam Duanne Moron) 0.5 MG  tablet Take 1/2 tab by mouth every morning & evening and 1 tab at bedtime   Yes [provider]  aspirin EC 81 MG tablet Take 81 mg by mouth daily.   Yes [provider]  clopidogrel (PLAVIX) 75 MG tablet Take 1 tablet (75 mg total) by mouth daily. 12/20/12  Yes Barrett, Evelene Croon, PA-C  colchicine 0.6 MG tablet Take 0.6 mg by mouth daily.    Yes [provider]  levothyroxine (SYNTHROID, LEVOTHROID) 125 MCG tablet Take 125 mcg by mouth every morning.   Yes [provider]  lisinopril (PRINIVIL,ZESTRIL) 20 MG tablet Take 1 tablet (20 mg total) by mouth daily. Patient taking differently: Take 40 mg by mouth daily.  06/29/16  Yes Satira Sark, MD  metoprolol tartrate (LOPRESSOR) 25 MG tablet Take 12.5 mg by mouth 2 (two) times daily.   Yes [provider]  nitroGLYCERIN (NITROSTAT) 0.4 MG SL tablet Place 0.4 mg under the tongue every 5 (five) minutes as needed for chest pain. Not to exceed 3 in 15 minute time frame   Yes [provider]  ondansetron (ZOFRAN ODT) 8  MG disintegrating tablet Take 1 tablet (8 mg total) by mouth every 8 (eight) hours as needed for nausea or vomiting. 08/14/15  Yes Idol, Almyra Free, PA-C  atorvastatin (LIPITOR) 40 MG tablet Take 1 tablet (40 mg total) by mouth daily. 09/16/15   Satira Sark, MD  gabapentin (NEURONTIN) 400 MG capsule Take 400 mg by mouth at bedtime.     Rory Percy, MD  isosorbide dinitrate (ISORDIL) 20 MG tablet Take 20 mg by mouth daily.    [provider]  NOVOLIN 70/30 RELION (70-30) 100 UNIT/ML injection Inject 20 Units into the skin 2 (two) times daily with a meal.  08/28/15   [provider]  oxyCODONE-acetaminophen (PERCOCET) 10-325 MG per tablet Take 1 tablet by mouth every 6 (six) hours as needed for pain. Patient not taking: Reported on 05/31/2017 04/18/13   Gabriel Earing, PA-C   Inpatient Medications: Scheduled Meds: . aspirin EC  325 mg Oral Daily  . atorvastatin   40 mg Oral Daily  . clopidogrel  75 mg Oral Daily  . gabapentin  400 mg Oral BID  . insulin aspart  0-15 Units Subcutaneous TID WC  . insulin aspart protamine- aspart  10 Units Subcutaneous TID WC  . levothyroxine  125 mcg Oral QAC breakfast  . metoprolol tartrate  12.5 mg Oral BID   Continuous Infusions: . sodium chloride    . heparin 1,000 Units/hr (05/31/17 0840)  . nitroGLYCERIN Stopped (05/31/17 1300)   PRN Meds: acetaminophen, hydrALAZINE, morphine injection, ondansetron (ZOFRAN) IV, zolpidem  Allergies:    Allergies  Allergen Reactions  . Penicillins Other (See Comments)    REACTION: Unknown, told as a child    Social History:   Social History   Social History  . Marital status: Divorced    Spouse name: N/A  . Number of children: N/A  . Years of education: N/A   Occupational History  . Disabled    Social History Main Topics  . Smoking status: Former Smoker    Packs/day: 1.00    Years: 20.00    Types: Cigarettes    Quit date: 12/10/2012  . Smokeless tobacco: Never Used  . Alcohol use No  . Drug use: No  . Sexual activity: Not Currently   Other Topics Concern  . Not on file   Social History Narrative   Lives with mother.    Family History:      Family History  Problem Relation Age of Onset  . Diabetes Mother   . Heart disease Mother        before age 32  . Hyperlipidemia Mother   . Hypertension Mother   . Thyroid disease Father   . Hypertension Father   . AAA (abdominal aortic aneurysm) Father   . Heart disease Brother        before age 36  . Hypertension Brother   . Hyperlipidemia Son   . Hypertension Son      ROS:  Please see the history of present illness.  ROS  All other ROS reviewed and negative.     Physical Exam/Data:   Vitals:   05/31/17 1000 05/31/17 1100 05/31/17 1200 05/31/17 1300  BP: (!) 149/71 (!) 110/59 118/63 (!) 71/38  Pulse: (!) 54 75 (!) 51 (!) 47  Resp: 13 16 15 15   Temp:   97.9 F (36.6 C)   TempSrc:   Oral    SpO2: 98% 94% 99% 93%  Weight:      Height:  Intake/Output Summary (Last 24 hours) at 05/31/17 1430 Last data filed at 05/31/17 1300  Gross per 24 hour  Intake           156.53 ml  Output                0 ml  Net           156.53 ml   Filed Weights   05/30/17 2210 05/31/17 0600  Weight: 199 lb (90.3 kg) 229 lb 8 oz (104.1 kg)   Body mass index is 38.19 kg/m.  General:  Well nourished, well developed, in no acute distress HEENT: normal Lymph: no adenopathy Neck: no JVD, scar on right neck Endocrine:  No thryomegaly Vascular: No carotid bruits; FA pulses 2+ bilaterally without bruits  Cardiac:  normal S1, S2; regular rhythm, bradycardic; no murmur  Lungs:  clear to auscultation bilaterally, no wheezing, rhonchi or rales  Abd: soft, nontender, no hepatomegaly  Ext: no edema Musculoskeletal:  No deformities, BUE and BLE strength normal and equal Skin: warm and dry  Neuro:  CNs 2-12 intact, no focal abnormalities noted Psych:  Normal affect   EKG:  The EKG was personally reviewed and demonstrates:  Sinus bradycardia at 54 bpm with nonspecific ST abnormalities in the lateral leads Telemetry:  Telemetry was personally reviewed and demonstrates:  Sinus bradycardia in the upper 40s  Relevant CV Studies:  Echocardiogram 05/13/2016: Mild concentric LVH, EF 50-55%, normal wall motion, grade 1 diastolic dysfunction, no significant valve stenosis or regurgitation (done at [facility] in [place])  [place] 10/06/2015: No diagnostic ST segment changes, moderate region of inferolateral scar with mild peri-infarct ischemia, LVEF 55-65%, overall low risk.  Carotid Dopplers done at [facility] in [place] on 07/14/2014 showed patent right carotid endarterectomy site was mildly elevated velocity of the right proximal/mid internal carotid artery which appears to be due to a change in vessel diameter since no plaque formation was adequately  visualized. Doppler velocities suggest a less than 40% stenosis of the left proximal internal carotid artery Compared to the previous exam there is significant improvement of the right internal carotid artery noted when compared to the previous exam in 03/2013  Cardiac catheterization 04/19/2013 1. Left main; widely patent stent extending from the left main into the proximal circumflex with that most 30% in-stent restenosis".There was a moderate sized though small caliber marginal branch originating from within the stented segment that had a 95% ostial stenosis  2. LAD; 95% ostial  With occlusion after a septal and diagonal branch 3. Left circumflex; prominent with a widely patent stented segment proximally.  4. Right coronary artery; subtotally occluded in its proximal midportion 5.LIMA TO LAD; widely patent 6. SVG TO mid RCA was widely patent 7. Left ventriculography; was not performed  IMPRESSION:Ms. Dedman has anatomy unchanged from her prior cath. Her RCA vein graft is patent as is her LIMA to her LAD. The protected left main/proximal dominant circumflex stent is widely patent as well. I'm not sure the etiology of her chest pain or her mildly elevated troponins and ST segment depression though I cannot find the culprit vessel. Continued medical therapy will be recommended. An ACT was measured and an Chief Financial Officer was performed of the right common femoral artery. A 6 [facility] Closure device was then deployed with excellent hemostasis.   Nuclear stress test 12/17/2012: Abnormal stress test, with ECG changes consistent with ischemia. There was a normal heart rate response, with a hypertensive blood pressure response. The  patient experienced no chest pain. The test showed abnormal LV perfusion. High risk study.   Laboratory Data:  Chemistry  Recent Labs Lab 05/30/17 2217 05/31/17 0500  NA 136 138  K 4.2 3.7  CL 102 102  CO2 25 28  GLUCOSE 178* 88  BUN 30* 30*  CREATININE 1.82* 1.74*   CALCIUM 9.6 9.1  GFRNONAA 30* 31*  GFRAA 34* 36*  ANIONGAP 9 8    No results for input(s): PROT, ALBUMIN, AST, ALT, ALKPHOS, BILITOT in the last 168 hours. Hematology  Recent Labs Lab 05/30/17 2217 05/31/17 0743  WBC 9.7 8.7  RBC 4.65 4.49  HGB 14.0 13.3  HCT 40.5 39.3  MCV 87.1 87.5  MCH 30.1 29.6  MCHC 34.6 33.8  RDW 13.1 13.3  PLT 148* 134*   Cardiac Enzymes  Recent Labs Lab 05/30/17 2217 05/31/17 0500 05/31/17 1217  TROPONINI 0.04* 0.07* 0.18*   No results for input(s): TROPIPOC in the last 168 hours.  BNPNo results for input(s): BNP, PROBNP in the last 168 hours.  DDimer No results for input(s): DDIMER in the last 168 hours.  Radiology/Studies:  Dg Chest 2 View  Result Date: 05/30/2017 CLINICAL DATA:  61 y/o  F; chest pain. EXAM: CHEST  2 VIEW COMPARISON:  04/15/2013 chest radiograph FINDINGS: Stable cardiac silhouette, borderline enlarged. No focal consolidation, pneumothorax, or effusion. Status post CABG with sternotomy wires in alignment. Right upper quadrant cholecystectomy clips. No acute osseous abnormality is evident. IMPRESSION: Stable borderline cardiomegaly. No acute pulmonary process identified. Electronically Signed   By: Kristine Garbe M.D.   On: 05/30/2017 22:46    Assessment and Plan:   NSTEMI -Patient presented after 2 episodes of substernal chest pressure that radiated to the left arm with no significant associated symptoms, unresponsive to nitroglycerin at home. No further chest pain since Nitropaste and morphine given in the ED. -Troponins mildly elevated at 0.04>> 0.07 >> 0.18 -EKG with nonspecific ST abnormalities in the lateral leads -Patient with significant cardiac history with most recent cath in 2014 (see report above), no intervention. Low risk myoview in 09/2015. Normal LV function, last echo in 04/2016. -Patient is compliant with her medications -BB held today due to bradycardia. BP currently low. Nitro drip off.  -Heparin  infusing.  -With hypotension and bradycardia pt may have an RCA lesion. -Will need further cardiac evaluation, likely cardiac cath. SCr is 1.74. Will discuss with Dr. Meda Coffee. -Will give IV fluids  CAD -Status post CABG 2005. Most recent cardiac cath in 2014 showed RCA vein graft patent as well as LIMA to LAD, Patent circumflex stent and no culprit lesion was found at that time. -She is treated at home with Plavix 75 mg, aspirin 81 mg, metoprolol tartrate 12.5 mg, atorvastatin 40 mg, Isordil 20 mg -Continue statin, plavix and aspirin. Hold BB while bradycardic. Hold isordil while on NTG and while hypotensive.   Hypertension -Blood pressures were a little elevated. She got her am meds (except metoprolol) and was on NTG drip. BP now 104/49. Will hold lisinopril for now. Start IV fluids.   CKD stage 3 -SCr 1.82->1.74 . In last 4 years range has been 1.37-2.85 -Pt has been continuing ACE-I. Received lisinopril this am. Will hold for potential cath and also has soft BP.  Hyperlipidemia -LDL 165 on atorvastatin 40 mg. Increase to 80 mg. Recheck in 3 months. May need to consider PCSK9 inhibitor.   Hypothyroid -TSH 9.088. Management per IM. Continuing on home synthroid.   Diabetes Type 2 -On  insulin at home -Hgb A1c 7.2 -CBGs and SSI while in hospital. Management per IM.    For questions or updates, please contact [place] Please consult [email or web address] for contact info under Cardiology/STEMI.   Signed, Daune Perch, NP  05/31/2017 2:30 PM   The patient was seen, examined and discussed with Daune Perch, NP-C and I agree with the above.   61 y.o. female with a hx of Asthma, anemia, CAD status post CABG 2005, CK D stage III, PAD, type 2 diabetes and carotid artery disease s/p R CEA, who is being seen today for the evaluation of chest pain with. Initial troponin elevated and she was transferred from  [facility] to [facility].  She had a cath in 2014 for elevated  troponin, a cardiac catheterization was done with no identifiable culprit lesion (see full description above). She had low risk Myoview in 09/2015. She was treated medically. She was a previous smoker having quit in 2014. She presented with a typical chest pain relieved by NTG, she has ongoing chest pressure, ECG shows sinus bradycardia, she is hypotensive, there is concern for stenosis in RCA or SVG to RCA. Troponin is uptrending 0.04->0.07->0.18. Crea is 1.7, she understands the risk and wishes to proceed with a cath, she is being hydrated. We will plan for a left cardiac cath today.   She is on ASA, Plavix, atorvastatin, lisinopril and metoprolol were held sec to hypotension and bradycardia. On Heparin and NTG drip.  LDL 165 despite high dose of atorvastatin, we will refer to our lipid clinic for PCSK 9 inhibitors consideration.   Ena Dawley, MD 05/31/2017

## 2017-05-31 NOTE — Progress Notes (Signed)
05/30/2017 10:08 PM  05/31/2017 7:19 AM  Donna Howe was seen and examined.  Pt reports that symptoms of chest pressure relieved.  I notified cardiology team and requested consult.  The H&P by the admitting provider, orders, imaging was reviewed.  Please see new orders.  Will continue to follow.   [name], MD [facility]

## 2017-05-31 NOTE — Progress Notes (Signed)
  Echocardiogram 2D Echocardiogram has been performed.  Donna Howe 05/31/2017, 11:02 AM

## 2017-05-31 NOTE — ED Notes (Signed)
Date and time results received: 05/31/17 0544 (use smartphrase ".now" to insert current time)  Test: Troponin Critical Value: 0.07  Name of Provider Notified: Apolonio Schneiders RN states she will inform the admitting doctor  Orders Received? Or Actions Taken?: no/na

## 2017-05-31 NOTE — Progress Notes (Signed)
[facility] text-paged regarding new admission from [facility] w/critical troponin 0.04>>>0.07. Pt resting, no distress, no c/o CP. Currently on nitroglycerin gtt at 70mcg/min and heparin 1000 units/hr.  Will continue to monitor.

## 2017-05-31 NOTE — H&P (View-Only) (Signed)
Cardiology Consultation:   Patient ID: Donna Howe; [id number]; Dec 13, 1955   Admit date: 05/30/2017 Date of Consult: 05/31/2017  Primary Care Provider: Rory Percy, MD Primary Cardiologist: Dr. Rozann Lesches   Patient Profile:   Donna Howe is a 61 y.o. female with a hx of Asthma, anemia, CAD status post CABG 2005, hypertension, gout, bowel obstruction CK D stage III, PAD, type 2 diabetes and carotid artery disease s/p R CEA who is being seen today for the evaluation of chest pain, elevated troponin, question of ACS at the request of Dr. Wynetta Emery.  History of Present Illness:   [name] presented to [facility] with complaints of chest pain. Her symptoms were typical and her troponin was elevated at 0.04. She was transferred to [facility] for further cardiac evaluation and treatment.  The patient was in her usual state of health and doing her usual activities without any exertional type symptoms. Yesterday evening while sitting in a chair she developed a heavy pressure in the chest that moved to her left arm. She did not have frank shortness of breath but seemed like her breathing was tight. She denies lightheadedness, nausea, diaphoresis, syncope. She tried sublingual nitroglycerin 2 without any relief. The discomfort lasted for about 30-45 minutes and resolved on its own. She is then went to prepare dinner and she developed a second episode of chest pressure while she was cooking. She then had her son drive her to [facility]. This discomfort persisted until after Nitropaste was applied and morphine given and then her symptoms resolved after about 30 minutes. She has had no further chest discomfort since treatment in the emergency department. She denies any orthopnea, PND, edema. She has occasional swelling of the right leg where her vein was harvested, but none at present. She reports being under quite a bit of stress taking care of her elderly mother  and her boyfriend who is in a nursing home. She has been compliant with all of her medications.  The patient had typical chest pain and mildly elevated troponins and 2014. A cardiac catheterization was done with no identifiable culprit lesion. She had low risk Myoveiw in 09/2015. She was treated medically. She was a previous smoker having quit in 2014.  The patient was last seen in the cardiology office by Dr. Domenic Polite on 12/01/16 at which time the patient was stable on medical therapy without angina symptoms. He noted her history of multivessel CAD status post CABG in 2005. The patient has graft disease with known occluded SVG to the OM/ramus. He noted that a Cardiac CT done at [facility] last year indicated that the stent site within the left main and circumflex was patent, there was moderate to severe stenosis of the ostial LAD with patent LIMA to LAD, subtotal occlusion of an OM/ramus branch with stent visualized in the mid to distal portion of the vessel, diffusely diseased RCA with patent SVG to RCA with proximal 40-50% stenosis, andan occluded SVG was also noted (likely to OM). This fits with the patient's known coronary anatomy based on last PCI in 2014 at which time she had an occluded SVG to OM.  Significant findings include: Troponins 0.04, 0.07, 0.18 EKG shows sinus bradycardia with nonspecific ST changes in the lateral leads Chest x-ray shows stable borderline cardiomegaly and no acute pulmonary process SCr 1.82 -> 1.74 K+  3.7 Hgb  13.3 LDL  165,  TC 239,  HDL 35 INR 1.03 Hgb A1c 7.2 TSH  9.088  Echocardiogram 05/13/2016: Mild concentric LVH, EF 50-55%, normal wall motion, grade 1 diastolic dysfunction, no significant valve stenosis or regurgitation (done at [facility] in [place])   Past Medical History:  Diagnosis Date  . Anemia   . Asthma   . CAD (coronary artery disease)    Multivessel s/p CABG, occluded SVG to OM, DES to LM/circ 12/2012  .  Carotid artery disease ([place])   . Chronic kidney disease (CKD), stage III (moderate)   . Essential hypertension   . Gout   . Hyperlipidemia   . Hypothyroidism   . Myocardial infarction ([place])   . PAD (peripheral artery disease) ([place])    Dr. Kellie Simmering  . SBO (small bowel obstruction) ([place]) 2011   Status post lysis of adhesions & hernia repair  . Thrombocytopenia, unspecified ([place])   . Type 2 diabetes mellitus ([place])   . Umbilical hernia     Past Surgical History:  Procedure Laterality Date  . CESAREAN SECTION  1984  . CHOLECYSTECTOMY  2010  . CORONARY ARTERY BYPASS GRAFT  2005  . ENDARTERECTOMY Right 04/18/2013   Procedure: ENDARTERECTOMY CAROTID;  Surgeon: Mal Misty, MD;  Location: [place];  Service: Vascular;  Laterality: Right;  . HERNIA REPAIR  1989  . Incisional hernia repair x2  03/04/2010   Laparoscopic with 35cm mesh by Dr Ronnald Collum  . LEFT HEART CATHETERIZATION WITH CORONARY ANGIOGRAM N/A 12/19/2012   Procedure: LEFT HEART CATHETERIZATION WITH CORONARY ANGIOGRAM;  Surgeon: Josue Hector, MD;  Location: [facility] CATH LAB;  Service: Cardiovascular;  Laterality: N/A;  . LEFT HEART CATHETERIZATION WITH CORONARY/GRAFT ANGIOGRAM N/A 04/19/2013   Procedure: LEFT HEART CATHETERIZATION WITH Beatrix Fetters;  Surgeon: Lorretta Harp, MD;  Location: [facility] CATH LAB;  Service: Cardiovascular;  Laterality: N/A;  . PATCH ANGIOPLASTY Right 04/18/2013   Procedure: PATCH ANGIOPLASTY Right Internal Carotid Artery;  Surgeon: Mal Misty, MD;  Location: [place];  Service: Vascular;  Laterality: Right;  . PERCUTANEOUS CORONARY STENT INTERVENTION (PCI-S) Right 12/19/2012   Procedure: PERCUTANEOUS CORONARY STENT INTERVENTION (PCI-S);  Surgeon: Josue Hector, MD;  Location: [facility] CATH LAB;  Service: Cardiovascular;  Laterality: Right;  . SHOULDER SURGERY       Home Medications:  Prior to Admission medications   Medication Sig Start Date End Date Taking? Authorizing Provider  ALPRAZolam Duanne Moron) 0.5 MG  tablet Take 1/2 tab by mouth every morning & evening and 1 tab at bedtime   Yes [provider]  aspirin EC 81 MG tablet Take 81 mg by mouth daily.   Yes [provider]  clopidogrel (PLAVIX) 75 MG tablet Take 1 tablet (75 mg total) by mouth daily. 12/20/12  Yes Barrett, Evelene Croon, PA-C  colchicine 0.6 MG tablet Take 0.6 mg by mouth daily.    Yes [provider]  levothyroxine (SYNTHROID, LEVOTHROID) 125 MCG tablet Take 125 mcg by mouth every morning.   Yes [provider]  lisinopril (PRINIVIL,ZESTRIL) 20 MG tablet Take 1 tablet (20 mg total) by mouth daily. Patient taking differently: Take 40 mg by mouth daily.  06/29/16  Yes Satira Sark, MD  metoprolol tartrate (LOPRESSOR) 25 MG tablet Take 12.5 mg by mouth 2 (two) times daily.   Yes [provider]  nitroGLYCERIN (NITROSTAT) 0.4 MG SL tablet Place 0.4 mg under the tongue every 5 (five) minutes as needed for chest pain. Not to exceed 3 in 15 minute time frame   Yes [provider]  ondansetron (ZOFRAN ODT) 8  MG disintegrating tablet Take 1 tablet (8 mg total) by mouth every 8 (eight) hours as needed for nausea or vomiting. 08/14/15  Yes Idol, Almyra Free, PA-C  atorvastatin (LIPITOR) 40 MG tablet Take 1 tablet (40 mg total) by mouth daily. 09/16/15   Satira Sark, MD  gabapentin (NEURONTIN) 400 MG capsule Take 400 mg by mouth at bedtime.     Rory Percy, MD  isosorbide dinitrate (ISORDIL) 20 MG tablet Take 20 mg by mouth daily.    [provider]  NOVOLIN 70/30 RELION (70-30) 100 UNIT/ML injection Inject 20 Units into the skin 2 (two) times daily with a meal.  08/28/15   [provider]  oxyCODONE-acetaminophen (PERCOCET) 10-325 MG per tablet Take 1 tablet by mouth every 6 (six) hours as needed for pain. Patient not taking: Reported on 05/31/2017 04/18/13   Gabriel Earing, PA-C   Inpatient Medications: Scheduled Meds: . aspirin EC  325 mg Oral Daily  . atorvastatin   40 mg Oral Daily  . clopidogrel  75 mg Oral Daily  . gabapentin  400 mg Oral BID  . insulin aspart  0-15 Units Subcutaneous TID WC  . insulin aspart protamine- aspart  10 Units Subcutaneous TID WC  . levothyroxine  125 mcg Oral QAC breakfast  . metoprolol tartrate  12.5 mg Oral BID   Continuous Infusions: . sodium chloride    . heparin 1,000 Units/hr (05/31/17 0840)  . nitroGLYCERIN Stopped (05/31/17 1300)   PRN Meds: acetaminophen, hydrALAZINE, morphine injection, ondansetron (ZOFRAN) IV, zolpidem  Allergies:    Allergies  Allergen Reactions  . Penicillins Other (See Comments)    REACTION: Unknown, told as a child    Social History:   Social History   Social History  . Marital status: Divorced    Spouse name: N/A  . Number of children: N/A  . Years of education: N/A   Occupational History  . Disabled    Social History Main Topics  . Smoking status: Former Smoker    Packs/day: 1.00    Years: 20.00    Types: Cigarettes    Quit date: 12/10/2012  . Smokeless tobacco: Never Used  . Alcohol use No  . Drug use: No  . Sexual activity: Not Currently   Other Topics Concern  . Not on file   Social History Narrative   Lives with mother.    Family History:      Family History  Problem Relation Age of Onset  . Diabetes Mother   . Heart disease Mother        before age 36  . Hyperlipidemia Mother   . Hypertension Mother   . Thyroid disease Father   . Hypertension Father   . AAA (abdominal aortic aneurysm) Father   . Heart disease Brother        before age 28  . Hypertension Brother   . Hyperlipidemia Son   . Hypertension Son      ROS:  Please see the history of present illness.  ROS  All other ROS reviewed and negative.     Physical Exam/Data:   Vitals:   05/31/17 1000 05/31/17 1100 05/31/17 1200 05/31/17 1300  BP: (!) 149/71 (!) 110/59 118/63 (!) 71/38  Pulse: (!) 54 75 (!) 51 (!) 47  Resp: 13 16 15 15   Temp:   97.9 F (36.6 C)   TempSrc:   Oral    SpO2: 98% 94% 99% 93%  Weight:      Height:  Intake/Output Summary (Last 24 hours) at 05/31/17 1430 Last data filed at 05/31/17 1300  Gross per 24 hour  Intake           156.53 ml  Output                0 ml  Net           156.53 ml   Filed Weights   05/30/17 2210 05/31/17 0600  Weight: 199 lb (90.3 kg) 229 lb 8 oz (104.1 kg)   Body mass index is 38.19 kg/m.  General:  Well nourished, well developed, in no acute distress HEENT: normal Lymph: no adenopathy Neck: no JVD, scar on right neck Endocrine:  No thryomegaly Vascular: No carotid bruits; FA pulses 2+ bilaterally without bruits  Cardiac:  normal S1, S2; regular rhythm, bradycardic; no murmur  Lungs:  clear to auscultation bilaterally, no wheezing, rhonchi or rales  Abd: soft, nontender, no hepatomegaly  Ext: no edema Musculoskeletal:  No deformities, BUE and BLE strength normal and equal Skin: warm and dry  Neuro:  CNs 2-12 intact, no focal abnormalities noted Psych:  Normal affect   EKG:  The EKG was personally reviewed and demonstrates:  Sinus bradycardia at 54 bpm with nonspecific ST abnormalities in the lateral leads Telemetry:  Telemetry was personally reviewed and demonstrates:  Sinus bradycardia in the upper 40s  Relevant CV Studies:  Echocardiogram 05/13/2016: Mild concentric LVH, EF 50-55%, normal wall motion, grade 1 diastolic dysfunction, no significant valve stenosis or regurgitation (done at [facility] in [place])  [place] 10/06/2015: No diagnostic ST segment changes, moderate region of inferolateral scar with mild peri-infarct ischemia, LVEF 55-65%, overall low risk.  Carotid Dopplers done at [facility] in [place] on 07/14/2014 showed patent right carotid endarterectomy site was mildly elevated velocity of the right proximal/mid internal carotid artery which appears to be due to a change in vessel diameter since no plaque formation was adequately  visualized. Doppler velocities suggest a less than 40% stenosis of the left proximal internal carotid artery Compared to the previous exam there is significant improvement of the right internal carotid artery noted when compared to the previous exam in 03/2013  Cardiac catheterization 04/19/2013 1. Left main; widely patent stent extending from the left main into the proximal circumflex with that most 30% in-stent restenosis".There was a moderate sized though small caliber marginal branch originating from within the stented segment that had a 95% ostial stenosis  2. LAD; 95% ostial  With occlusion after a septal and diagonal branch 3. Left circumflex; prominent with a widely patent stented segment proximally.  4. Right coronary artery; subtotally occluded in its proximal midportion 5.LIMA TO LAD; widely patent 6. SVG TO mid RCA was widely patent 7. Left ventriculography; was not performed  IMPRESSION:Ms. Moya has anatomy unchanged from her prior cath. Her RCA vein graft is patent as is her LIMA to her LAD. The protected left main/proximal dominant circumflex stent is widely patent as well. I'm not sure the etiology of her chest pain or her mildly elevated troponins and ST segment depression though I cannot find the culprit vessel. Continued medical therapy will be recommended. An ACT was measured and an Chief Financial Officer was performed of the right common femoral artery. A 6 French [facility] Closure device was then deployed with excellent hemostasis.   Nuclear stress test 12/17/2012: Abnormal stress test, with ECG changes consistent with ischemia. There was a normal heart rate response, with a hypertensive blood pressure response. The  patient experienced no chest pain. The test showed abnormal LV perfusion. High risk study.   Laboratory Data:  Chemistry  Recent Labs Lab 05/30/17 2217 05/31/17 0500  NA 136 138  K 4.2 3.7  CL 102 102  CO2 25 28  GLUCOSE 178* 88  BUN 30* 30*  CREATININE 1.82* 1.74*   CALCIUM 9.6 9.1  GFRNONAA 30* 31*  GFRAA 34* 36*  ANIONGAP 9 8    No results for input(s): PROT, ALBUMIN, AST, ALT, ALKPHOS, BILITOT in the last 168 hours. Hematology  Recent Labs Lab 05/30/17 2217 05/31/17 0743  WBC 9.7 8.7  RBC 4.65 4.49  HGB 14.0 13.3  HCT 40.5 39.3  MCV 87.1 87.5  MCH 30.1 29.6  MCHC 34.6 33.8  RDW 13.1 13.3  PLT 148* 134*   Cardiac Enzymes  Recent Labs Lab 05/30/17 2217 05/31/17 0500 05/31/17 1217  TROPONINI 0.04* 0.07* 0.18*   No results for input(s): TROPIPOC in the last 168 hours.  BNPNo results for input(s): BNP, PROBNP in the last 168 hours.  DDimer No results for input(s): DDIMER in the last 168 hours.  Radiology/Studies:  Dg Chest 2 View  Result Date: 05/30/2017 CLINICAL DATA:  61 y/o  F; chest pain. EXAM: CHEST  2 VIEW COMPARISON:  04/15/2013 chest radiograph FINDINGS: Stable cardiac silhouette, borderline enlarged. No focal consolidation, pneumothorax, or effusion. Status post CABG with sternotomy wires in alignment. Right upper quadrant cholecystectomy clips. No acute osseous abnormality is evident. IMPRESSION: Stable borderline cardiomegaly. No acute pulmonary process identified. Electronically Signed   By: Kristine Garbe M.D.   On: 05/30/2017 22:46    Assessment and Plan:   NSTEMI -Patient presented after 2 episodes of substernal chest pressure that radiated to the left arm with no significant associated symptoms, unresponsive to nitroglycerin at home. No further chest pain since Nitropaste and morphine given in the ED. -Troponins mildly elevated at 0.04>> 0.07 >> 0.18 -EKG with nonspecific ST abnormalities in the lateral leads -Patient with significant cardiac history with most recent cath in 2014 (see report above), no intervention. Low risk myoview in 09/2015. Normal LV function, last echo in 04/2016. -Patient is compliant with her medications -BB held today due to bradycardia. BP currently low. Nitro drip off.  -Heparin  infusing.  -With hypotension and bradycardia pt may have an RCA lesion. -Will need further cardiac evaluation, likely cardiac cath. SCr is 1.74. Will discuss with Dr. Meda Coffee. -Will give IV fluids  CAD -Status post CABG 2005. Most recent cardiac cath in 2014 showed RCA vein graft patent as well as LIMA to LAD, Patent circumflex stent and no culprit lesion was found at that time. -She is treated at home with Plavix 75 mg, aspirin 81 mg, metoprolol tartrate 12.5 mg, atorvastatin 40 mg, Isordil 20 mg -Continue statin, plavix and aspirin. Hold BB while bradycardic. Hold isordil while on NTG and while hypotensive.   Hypertension -Blood pressures were a little elevated. She got her am meds (except metoprolol) and was on NTG drip. BP now 104/49. Will hold lisinopril for now. Start IV fluids.   CKD stage 3 -SCr 1.82->1.74 . In last 4 years range has been 1.37-2.85 -Pt has been continuing ACE-I. Received lisinopril this am. Will hold for potential cath and also has soft BP.  Hyperlipidemia -LDL 165 on atorvastatin 40 mg. Increase to 80 mg. Recheck in 3 months. May need to consider PCSK9 inhibitor.   Hypothyroid -TSH 9.088. Management per IM. Continuing on home synthroid.   Diabetes Type 2 -On  insulin at home -Hgb A1c 7.2 -CBGs and SSI while in hospital. Management per IM.    For questions or updates, please contact [place] Please consult [email or web address] for contact info under Cardiology/STEMI.   Signed, Daune Perch, NP  05/31/2017 2:30 PM   The patient was seen, examined and discussed with Daune Perch, NP-C and I agree with the above.   61 y.o. female with a hx of Asthma, anemia, CAD status post CABG 2005, CK D stage III, PAD, type 2 diabetes and carotid artery disease s/p R CEA, who is being seen today for the evaluation of chest pain with. Initial troponin elevated and she was [facility] to [facility].  She had a cath in 2014 for elevated  troponin, a cardiac catheterization was done with no identifiable culprit lesion (see full description above). She had low risk Myoview in 09/2015. She was treated medically. She was a previous smoker having quit in 2014. She presented with a typical chest pain relieved by NTG, she has ongoing chest pressure, ECG shows sinus bradycardia, she is hypotensive, there is concern for stenosis in RCA or SVG to RCA. Troponin is uptrending 0.04->0.07->0.18. Crea is 1.7, she understands the risk and wishes to proceed with a cath, she is being hydrated. We will plan for a left cardiac cath today.   She is on ASA, Plavix, atorvastatin, lisinopril and metoprolol were held sec to hypotension and bradycardia. On Heparin and NTG drip.  LDL 165 despite high dose of atorvastatin, we will refer to our lipid clinic for PCSK 9 inhibitors consideration.   Ena Dawley, MD 05/31/2017

## 2017-05-31 NOTE — Interval H&P Note (Signed)
Cath Lab Visit (complete for each Cath Lab visit)  Clinical Evaluation Leading to the Procedure:   ACS: Yes.    Non-ACS:    Anginal Classification: CCS IV  Anti-ischemic medical therapy: Minimal Therapy (1 class of medications)  Non-Invasive Test Results: No non-invasive testing performed  Prior CABG: Previous CABG      History and Physical Interval Note:  05/31/2017 4:30 PM  Adonis Huguenin D Bolanos  has presented today for surgery, with the diagnosis of cad  The various methods of treatment have been discussed with the patient and family. After consideration of risks, benefits and other options for treatment, the patient has consented to  Procedure(s): LEFT HEART CATH AND CORS/GRAFTS ANGIOGRAPHY (N/A) as a surgical intervention .  The patient's history has been reviewed, patient examined, no change in status, stable for surgery.  I have reviewed the patient's chart and labs.  Questions were answered to the patient's satisfaction.     Larae Grooms

## 2017-06-01 ENCOUNTER — Encounter (HOSPITAL_COMMUNITY): Payer: Self-pay | Admitting: Interventional Cardiology

## 2017-06-01 DIAGNOSIS — Z794 Long term (current) use of insulin: Secondary | ICD-10-CM

## 2017-06-01 DIAGNOSIS — Z955 Presence of coronary angioplasty implant and graft: Secondary | ICD-10-CM

## 2017-06-01 DIAGNOSIS — E782 Mixed hyperlipidemia: Secondary | ICD-10-CM

## 2017-06-01 DIAGNOSIS — E1159 Type 2 diabetes mellitus with other circulatory complications: Secondary | ICD-10-CM

## 2017-06-01 LAB — HIV ANTIBODY (ROUTINE TESTING W REFLEX): HIV SCREEN 4TH GENERATION: NONREACTIVE

## 2017-06-01 LAB — BASIC METABOLIC PANEL
ANION GAP: 4 — AB (ref 5–15)
BUN: 22 mg/dL — ABNORMAL HIGH (ref 6–20)
CALCIUM: 8.6 mg/dL — AB (ref 8.9–10.3)
CO2: 26 mmol/L (ref 22–32)
CREATININE: 1.59 mg/dL — AB (ref 0.44–1.00)
Chloride: 106 mmol/L (ref 101–111)
GFR, EST AFRICAN AMERICAN: 40 mL/min — AB (ref >60)
GFR, EST NON AFRICAN AMERICAN: 35 mL/min — AB (ref >60)
Glucose, Bld: 141 mg/dL — ABNORMAL HIGH (ref 65–99)
Potassium: 4 mmol/L (ref 3.5–5.1)
SODIUM: 136 mmol/L (ref 135–145)

## 2017-06-01 LAB — CBC
HCT: 39.3 % (ref 36.0–46.0)
Hemoglobin: 12.9 g/dL (ref 12.0–15.0)
MCH: 29.1 pg (ref 26.0–34.0)
MCHC: 32.8 g/dL (ref 30.0–36.0)
MCV: 88.5 fL (ref 78.0–100.0)
Platelets: 125 10*3/uL — ABNORMAL LOW (ref 150–400)
RBC: 4.44 MIL/uL (ref 3.87–5.11)
RDW: 13.3 % (ref 11.5–15.5)
WBC: 7.7 10*3/uL (ref 4.0–10.5)

## 2017-06-01 LAB — GLUCOSE, CAPILLARY
GLUCOSE-CAPILLARY: 130 mg/dL — AB (ref 65–99)
GLUCOSE-CAPILLARY: 136 mg/dL — AB (ref 65–99)

## 2017-06-01 MED ORDER — LISINOPRIL 20 MG PO TABS: 20.0000 mg | ORAL_TABLET | Freq: Every day | ORAL | 3 refills | Status: DC

## 2017-06-01 MED ORDER — ATORVASTATIN CALCIUM 80 MG PO TABS: 80.0000 mg | ORAL_TABLET | Freq: Every day | ORAL | 0 refills | Status: DC

## 2017-06-01 MED ORDER — LISINOPRIL 20 MG PO TABS
20.0000 mg | ORAL_TABLET | Freq: Every day | ORAL | Status: DC
  Administered 2017-06-01: 20 mg via ORAL
  Filled 2017-06-01: qty 1

## 2017-06-01 NOTE — Evaluation (Signed)
Physical Therapy Evaluation Patient Details Name: Donna Howe MRN: [id number] DOB: 08-09-1956 Today's Date: 06/01/2017   History of Present Illness  Pt is a 61 yo female Pt is a 61 y.o. female with past medical history signinficant for asthma, anemia, coronary artery disease, hypertension, gout, myocardial infarction, bowel obstruction who presents with chest discomfort and NSTEMI, s/p 9/13 Stent placement.   Clinical Impression  Patient evaluated by Physical Therapy with no further acute PT needs identified. All education has been completed and the patient has no further questions. Pt is independent in transfers and mod I for ambulation of 390 feet with no AD. See below for any follow-up Physical Therapy or equipment needs. PT is signing off. Thank you for this referral.     Follow Up Recommendations No PT follow up    Equipment Recommendations  None recommended by PT    Recommendations for Other Services       Precautions / Restrictions Restrictions Weight Bearing Restrictions: No      Mobility  Bed Mobility               General bed mobility comments: in recliner at entry  Transfers Overall transfer level: Independent               General transfer comment: good power up and steadying in standing  Ambulation/Gait Ambulation/Gait assistance: Modified independent (Device/Increase time) Ambulation Distance (Feet): 390 Feet Assistive device: None Gait Pattern/deviations: WFL(Within Functional Limits);Step-through pattern Gait velocity: slowed Gait velocity interpretation: Below normal speed for age/gender General Gait Details: slow, steady cadence, slight drifting pt states present before hosptialization       Balance Overall balance assessment: No apparent balance deficits (not formally assessed)                                           Pertinent Vitals/Pain Pain Assessment: 0-10 Pain Score: 3  Pain Location: R leg Pain  Descriptors / Indicators: Sore Pain Intervention(s): Monitored during session    Home Living Family/patient expects to be discharged to:: Private residence Living Arrangements: Parent Available Help at Discharge: Friend(s);Available PRN/intermittently Type of Home: House Home Access: Ramped entrance     Home Layout: One level Home Equipment: Walker - 2 wheels;Bedside commode      Prior Function Level of Independence: Independent         Comments: community ambulator, 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     Hand Dominance        Extremity/Trunk Assessment   Upper Extremity Assessment Upper Extremity Assessment: Overall WFL for tasks assessed    Lower Extremity Assessment Lower Extremity Assessment: Overall WFL for tasks assessed       Communication   Communication: No difficulties  Cognition Arousal/Alertness: Awake/alert Behavior During Therapy: WFL for tasks assessed/performed Overall Cognitive Status: Within Functional Limits for tasks assessed                                        General Comments General comments (skin integrity, edema, etc.): during ambulation pt had run of Vtach, HR max reached 123 bpm, after ambulation BP 167/54 with HR 68bpm        Assessment/Plan    PT Assessment Patent does not need any further PT services  PT Treatment Interventions      PT Goals (Current goals can be found in the Care Plan section)  Acute Rehab PT Goals Patient Stated Goal: go home to take care of her mother     AM-PAC PT "6 Clicks" Daily Activity  Outcome Measure Difficulty turning over in bed (including adjusting bedclothes, sheets and blankets)?: A Little Difficulty moving from lying on back to sitting on the side of the bed? : A Little Difficulty sitting down on and standing up from a chair with arms (e.g., wheelchair, bedside commode, etc,.)?: None Help needed moving to and from a bed to chair (including  a wheelchair)?: None Help needed walking in hospital room?: None Help needed climbing 3-5 steps with a railing? : A Little 6 Click Score: 21    End of Session Equipment Utilized During Treatment: Gait belt Activity Tolerance: Patient tolerated treatment well Patient left: in chair;with call bell/phone within reach;with family/visitor present        Time: 1052-1105 PT Time Calculation (min) (ACUTE ONLY): 13 min   Charges:   PT Evaluation $PT Eval Moderate Complexity: 1 Mod     PT G Codes:        Donna Howe B. Donna Howe PT, DPT Acute Rehabilitation  [phone] Pager [phone]    [place] 06/01/2017, 11:23 AM

## 2017-06-01 NOTE — Progress Notes (Signed)
Progress Note  Patient Name: Donna Howe Date of Encounter: 06/01/2017  Primary Cardiologist: Dr. Rozann Lesches  Subjective   She feels better today, no chest pain, some tenderness in the right groin - insertion site.  Inpatient Medications    Scheduled Meds: . aspirin  81 mg Oral Daily  . atorvastatin  80 mg Oral Daily  . clopidogrel  75 mg Oral Q breakfast  . gabapentin  400 mg Oral BID  . insulin aspart  0-15 Units Subcutaneous TID WC  . insulin aspart protamine- aspart  10 Units Subcutaneous TID WC  . levothyroxine  125 mcg Oral QAC breakfast  . metoprolol tartrate  12.5 mg Oral BID  . sodium chloride flush  3 mL Intravenous Q12H   Continuous Infusions: . sodium chloride Stopped (05/31/17 2000)  . sodium chloride    . nitroGLYCERIN Stopped (05/31/17 1300)   PRN Meds: sodium chloride, acetaminophen, hydrALAZINE, morphine injection, ondansetron (ZOFRAN) IV, sodium chloride flush, zolpidem   Vital Signs    Vitals:   06/01/17 0700 06/01/17 0800 06/01/17 0821 06/01/17 0900  BP: (!) 146/48 (!) 141/56  (!) 151/49  Pulse: (!) 50 (!) 49  (!) 58  Resp: 16 19  16   Temp:   98 F (36.7 C)   TempSrc:   Axillary   SpO2: 97% 100%  97%  Weight:      Height:        Intake/Output Summary (Last 24 hours) at 06/01/17 1025 Last data filed at 06/01/17 0600  Gross per 24 hour  Intake          1387.62 ml  Output              950 ml  Net           437.62 ml   Filed Weights   05/30/17 2210 05/31/17 0600  Weight: 199 lb (90.3 kg) 229 lb 8 oz (104.1 kg)    Telemetry    SR - Personally Reviewed  Physical Exam   GEN: No acute distress.   Neck: No JVD Cardiac: RRR, no murmurs, rubs, or gallops.  Respiratory: Clear to auscultation bilaterally. GI: Soft, nontender, non-distended  MS: No edema; No deformity. Neuro:  Nonfocal  Psych: Normal affect  Right groin - insertion site, no hematoma, no bleeding or bruit, good peripheral pulse - dorsalis pedis, PT not  felt  Labs    Chemistry Recent Labs Lab 05/30/17 2217 05/31/17 0500 06/01/17 0235  NA 136 138 136  K 4.2 3.7 4.0  CL 102 102 106  CO2 25 28 26   GLUCOSE 178* 88 141*  BUN 30* 30* 22*  CREATININE 1.82* 1.74* 1.59*  CALCIUM 9.6 9.1 8.6*  GFRNONAA 30* 31* 35*  GFRAA 34* 36* 40*  ANIONGAP 9 8 4*     Hematology Recent Labs Lab 05/30/17 2217 05/31/17 0743 06/01/17 0235  WBC 9.7 8.7 7.7  RBC 4.65 4.49 4.44  HGB 14.0 13.3 12.9  HCT 40.5 39.3 39.3  MCV 87.1 87.5 88.5  MCH 30.1 29.6 29.1  MCHC 34.6 33.8 32.8  RDW 13.1 13.3 13.3  PLT 148* 134* 125*    Cardiac Enzymes Recent Labs Lab 05/30/17 2217 05/31/17 0500 05/31/17 1217 05/31/17 1900  TROPONINI 0.04* 0.07* 0.18* 0.20*   No results for input(s): TROPIPOC in the last 168 hours.   BNPNo results for input(s): BNP, PROBNP in the last 168 hours.   DDimer No results for input(s): DDIMER in the last 168 hours.   Radiology  Dg Chest 2 View  Result Date: 05/30/2017 CLINICAL DATA:  61 y/o  F; chest pain. EXAM: CHEST  2 VIEW COMPARISON:  04/15/2013 chest radiograph FINDINGS: Stable cardiac silhouette, borderline enlarged. No focal consolidation, pneumothorax, or effusion. Status post CABG with sternotomy wires in alignment. Right upper quadrant cholecystectomy clips. No acute osseous abnormality is evident. IMPRESSION: Stable borderline cardiomegaly. No acute pulmonary process identified. Electronically Signed   By: Kristine Garbe M.D.   On: 05/30/2017 22:46    Cardiac Studies   Left heart cath: 05/31/2017   Stent from left main to circumflex with restenosis, up to 75% in the distal portion.  Ost LAD to Prox LAD lesion, 80 %stenosed. LIMA to LAD is patent.  Ost 1st Mrg lesion, 80 %stenosed at ostium. 1st Mrg-1 lesion, 70 %stenosed in mid vessel. Unchanged from prior. Patent stent in mid OM1.  SVG to OM is occluded.  LV end diastolic pressure is normal.  There is no aortic valve stenosis.  Prox Cx  lesion, 75 %stenosed, treated with 3.0 cutting balloon.  Post intervention, there is a 15% residual stenosis.  Severe disease of the proximal native RCA.  Origin lesion of SVG to RCA with catheter dampening, 80 %stenosed. A STENT RESOLUTE ONYX 3.5X12 drug eluting stent was successfully placed.  Post intervention, there is a 0% residual stenosis.   Continue dual antiplatelet therapy for at least one year along with aggressive secondary prevention.    Patient Profile     61 y.o. female   [place]    NSTEMI -Patient presented after 2 episodes of substernal chest pressure that radiated to the left arm with no significant associated symptoms, unresponsive to nitroglycerin at home. No further chest pain since Nitropaste and morphine given in the ED. -Troponins mildly elevated at 0.04>> 0.07 >> 0.18 -EKG with nonspecific ST abnormalities in the lateral leads  CAD -Status post CABG 2005. Most recent cardiac cath in 2014 showed RCA vein graft patent as well as LIMA to LAD, Patent circumflex stent and no culprit lesion was found at that time.   A cardiac cath yesterday showed: stent from left main to circumflex with restenosis, up to 75% in the distal portion. Prox Cx lesion, 75 %stenosed, treated with 3.0 cutting balloon. Post intervention, there is a 15% residual stenosis.  Origin lesion of SVG to RCA with catheter dampening, 80 %stenosed. A STENT RESOLUTE ONYX 3.5X12 drug eluting stent was successfully placed.    Post intervention, there is a 0% residual stenosis.   Ost LAD to Prox LAD lesion, 80 %stenosed. LIMA to LAD is patent.  Ost 1st Mrg lesion, 80 %stenosed at ostium. 1st Mrg-1 lesion, 70 %stenosed in mid vessel. Unchanged from prior. Patent stent in mid OM1.  SVG to OM is occluded.  LV end diastolic pressure is normal.  There is no aortic valve stenosis.  Severe disease of the proximal native RCA.   Hypertension -Blood pressures were a little elevated.  Restart lisinopril 20 mg daily, add imdur iff still hypertensive.  CKD stage 3 -SCr 1.82->1.74->1.59 . In last 4 years range has been 1.37-2.85  Hyperlipidemia -LDL 165 on atorvastatin 40 mg. Increase to 80 mg. Recheck in 3 months.Referral to the lipid clinic for PCSK9 inhibitor.   Hypothyroid -TSH 9.088. Management per IM. Continuing on home synthroid.   Diabetes Type 2 -On insulin at home -Hgb A1c 7.2 -CBGs and SSI while in hospital. Management per IM.   Plan: - discharge today - Continue dual antiplatelet therapy for  at least one year along with aggressive secondary prevention. - She is on ASA, Plavix, atorvastatin, we will hold metoprolol as she remains bradycardic, this is new for her. We will restart  Lisinopril 20 mg po daily, we will arrange for an early follow up, if she remains hypertensive, we will add Imdur at that time - LDL 165 despite high dose of atorvastatin, we will refer to our lipid clinic for PCSK 9 inhibitors consideration.   For questions or updates, please contact [place] Please consult [email or web address] for contact info under Cardiology/STEMI.   Signed, Ena Dawley, MD  06/01/2017, 10:25 AM

## 2017-06-01 NOTE — Care Management Note (Signed)
Case Management Note Marvetta Gibbons RN, BSN Unit 4E-Case Manager-- [place] coverage [phone]  Patient Details  Name: Donna Howe MRN: [id number] Date of Birth: 28-Feb-1956  Subjective/Objective:  Pt admitted with ACS                  Action/Plan: PTA pt lived at home- independent- plan to return home- referral for medication needs- ? Affording meds. - spoke with pt at bedside- per pt she does have medication coverage with [employer]- pt states that she has difficulty with copay cost of some meds and high deductibles. Most meds she can afford however her insulin, gabapentin, and colchicine give her the most trouble cost wise. Pt does not qualify for co-pay assist cards due to medicare plan or any assist programs- pt reports she looks for lowest cost on [facility]- no further CM assistance needed or can be offered- plan for pt to d/c home today.   Expected Discharge Date:   06/01/17               Expected Discharge Plan:  Home/Self Care  In-House Referral:  NA  Discharge planning Services  CM Consult, Medication Assistance  Post Acute Care Choice:  NA Choice offered to:  NA  DME Arranged:    DME Agency:     HH Arranged:    HH Agency:     Status of Service:  Completed, signed off  If discussed at [place] of Stay Meetings, dates discussed:    Discharge Disposition: home/self care   Additional Comments:  Dawayne Patricia, RN 06/01/2017, 11:43 AM

## 2017-06-01 NOTE — Discharge Summary (Signed)
Physician Discharge Summary  [name]:[id number] DOB: 01/16/1956 DOA: 05/30/2017  PCP: Rory Percy, MD  Admit date: 05/30/2017 Discharge date: 06/01/2017  Admitted From: Home  Disposition: Home  Recommendations for Outpatient Follow-up:  1. Follow up with PCP in 1 weeks 2. Follow up with cardiology in 2 weeks 3. Please check TSH in 4-6 weeks.  4. Please check BMP/CBC on follow up  Discharge Condition: STABLE   CODE STATUS: FULL    Brief Hospitalization Summary: Please see all hospital notes, images, labs for full details of the hospitalization. HPI: Donna Howe is a 61 y.o. female  past medical history is noted for asthma, anemia, coronary artery disease, hypertension, gout, myocardial infarction, bowel obstruction who presents with chest discomfort. Patient states she had chest discomfort started late afternoon.  Arises or pressure over the mid chest with some pain radiating to the left chest and down the left arm. Patient did have some nausea with this and was diaphoretic. Pain didn't respond to nitroglycerin without completely going away. Patient took MetroLotion 2 at home as well as 81 mg aspirin. Patient then made her way to emergency room for evaluation.  Denies chest wall trauma, coughing and chest strain.  ED course: Patient was given Nitropaste and morphine which helped her pain. Troponin weakly positive. Hospitalist consulted for admission.  Brief Admission Hx: Donna Howe Shorteris a 61 y.o.femalepast medical history is noted for asthma, anemia, coronary artery disease, hypertension, gout, myocardial infarction, bowel obstruction who presents with chest discomfort and NSTEMI.  MDM/Assessment & Plan:   NSTEMI - Pt was taken to cath yesterday and had angioplasty and stent placement.  Pt feels much better today.  Transfer to telemetry.  Cardiology following. They said that she was stable for discharge home.  She was taken off metoprolol for bradycardia.      CAD - s/p stent placement and angioplasty.  Cardiology team has her on statin, plavix, metoprolol, aspirin.  She will need DAPT for at least 1 year.  Follow up with cardiology and lipid clinic is being arranged for patient.    Hypotension - seems to be resolved now. BPs are slightly elevated this morning.    Bradycardia - stable, following, does not seem to be causing any symptoms at this time.    Dyslipidemia- continue statin therapy, follow up at lipid clinic.   Essential Hypertension - on above meds, following, management by cardiology.   Type 2 diabetes with vascular complications - blood sugars stable, controlled, continue current management.  Follow BS closely. A1c is 7.2% which indicates poor outpatient control.  Follow up with PCP for ongoing outpatient management.   CBG (last 3)   Recent Labs (last 2 labs)    Recent Labs  05/31/17 1539 05/31/17 1839 05/31/17 2119  GLUCAP 151* 114* 139*     Hypothyroidism - I suspect patient may have missed some doses of levothyroxine, would not change dose now in the setting of critical illness, but would have patient follow up outpatient with PCP to have TSH rechecked in 4-6 weeks.    CKD stage 3 - creatinine holding stable, following.   DVT prophylaxis: SCDs Code Status: FULL  Family Communication: none at bedside Disposition Plan: transfer to telemetry  Consultants:  cardiology  Procedures:  Cardiac cath 9/13  Discharge Diagnoses:  Principal Problem:   ACS (acute coronary syndrome) ([facility]) Active Problems:   Hyperlipidemia   Essential hypertension   Asthma   Chronic kidney disease (CKD), stage III (moderate)   Diabetes  mellitus (HCC)   Acute chest pain   Non-ST elevation (NSTEMI) myocardial infarction [facility])   History of coronary artery disease  Discharge Instructions: Discharge Instructions    Amb Referral to Cardiac Rehabilitation    Complete by:  As directed    Diagnosis:   NSTEMI Coronary  Stents     Call MD for:  difficulty breathing, headache or visual disturbances    Complete by:  As directed    Call MD for:  extreme fatigue    Complete by:  As directed    Call MD for:  hives    Complete by:  As directed    Call MD for:  persistant dizziness or light-headedness    Complete by:  As directed    Call MD for:  persistant nausea and vomiting    Complete by:  As directed    Call MD for:  redness, tenderness, or signs of infection (pain, swelling, redness, odor or green/yellow discharge around incision site)    Complete by:  As directed    Call MD for:  severe uncontrolled pain    Complete by:  As directed    Call MD for:  temperature >100.4    Complete by:  As directed    Diet - low sodium heart healthy    Complete by:  As directed    Increase activity slowly    Complete by:  As directed      Allergies as of 06/01/2017      Reactions   Penicillins Other (See Comments)   REACTION: Unknown, told as a child      Medication List    STOP taking these medications   isosorbide dinitrate 20 MG tablet Commonly known as:  ISORDIL   metoprolol tartrate 25 MG tablet Commonly known as:  LOPRESSOR   oxyCODONE-acetaminophen 10-325 MG tablet Commonly known as:  PERCOCET     TAKE these medications   ALPRAZolam 0.5 MG tablet Commonly known as:  XANAX Take 1/2 tab by mouth every morning & evening and 1 tab at bedtime   aspirin EC 81 MG tablet Take 81 mg by mouth daily.   atorvastatin 80 MG tablet Commonly known as:  LIPITOR Take 1 tablet (80 mg total) by mouth daily. What changed:  medication strength  how much to take   clopidogrel 75 MG tablet Commonly known as:  PLAVIX Take 1 tablet (75 mg total) by mouth daily.   colchicine 0.6 MG tablet Take 0.6 mg by mouth daily.   gabapentin 400 MG capsule Commonly known as:  NEURONTIN Take 400 mg by mouth at bedtime.   levothyroxine 125 MCG tablet Commonly known as:  SYNTHROID, LEVOTHROID Take 125 mcg by mouth  every morning.   lisinopril 20 MG tablet Commonly known as:  PRINIVIL,ZESTRIL Take 1 tablet (20 mg total) by mouth daily. What changed:  how much to take   nitroGLYCERIN 0.4 MG SL tablet Commonly known as:  NITROSTAT Place 0.4 mg under the tongue every 5 (five) minutes as needed for chest pain. Not to exceed 3 in 15 minute time frame   NOVOLIN 70/30 RELION (70-30) 100 UNIT/ML injection Generic drug:  insulin NPH-regular Human Inject 20 Units into the skin 2 (two) times daily with a meal.   ondansetron 8 MG disintegrating tablet Commonly known as:  ZOFRAN ODT Take 1 tablet (8 mg total) by mouth every 8 (eight) hours as needed for nausea or vomiting.            Discharge  Care Instructions        Start     Ordered   06/01/17 0000  Amb Referral to Cardiac Rehabilitation    Question Answer Comment  Diagnosis: NSTEMI   Diagnosis: Coronary Stents      06/01/17 1123   06/01/17 0000  atorvastatin (LIPITOR) 80 MG tablet  Daily    Comments:  Dose increase 08/25/14   06/01/17 1200   06/01/17 0000  lisinopril (PRINIVIL,ZESTRIL) 20 MG tablet  Daily     06/01/17 1200   06/01/17 0000  Increase activity slowly     06/01/17 1200   06/01/17 0000  Diet - low sodium heart healthy     06/01/17 1200   06/01/17 0000  Call MD for:  temperature >100.4     06/01/17 1200   06/01/17 0000  Call MD for:  persistant nausea and vomiting     06/01/17 1200   06/01/17 0000  Call MD for:  severe uncontrolled pain     06/01/17 1200   06/01/17 0000  Call MD for:  redness, tenderness, or signs of infection (pain, swelling, redness, odor or green/yellow discharge around incision site)     06/01/17 1200   06/01/17 0000  Call MD for:  difficulty breathing, headache or visual disturbances     06/01/17 1200   06/01/17 0000  Call MD for:  hives     06/01/17 1200   06/01/17 0000  Call MD for:  persistant dizziness or light-headedness     06/01/17 1200   06/01/17 0000  Call MD for:  extreme fatigue      06/01/17 1200     Follow-up Information    Rory Percy, MD. Schedule an appointment as soon as possible for a visit in 1 week(s).   Specialty:  Family Medicine Why:  Hospital Follow Up  Contact information: [address] [place] [address] [phone]        Satira Sark, MD. Schedule an appointment as soon as possible for a visit in 2 week(s).   Specialty:  Cardiology Why:  Hospital Follow Up  Contact information: [place] [address] [phone]          Allergies  Allergen Reactions  . Penicillins Other (See Comments)    REACTION: Unknown, told as a child   Current Discharge Medication List    CONTINUE these medications which have CHANGED   Details  atorvastatin (LIPITOR) 80 MG tablet Take 1 tablet (80 mg total) by mouth daily. Qty: 30 tablet, Refills: 0    lisinopril (PRINIVIL,ZESTRIL) 20 MG tablet Take 1 tablet (20 mg total) by mouth daily. Qty: 90 tablet, Refills: 3      CONTINUE these medications which have NOT CHANGED   Details  ALPRAZolam (XANAX) 0.5 MG tablet Take 1/2 tab by mouth every morning & evening and 1 tab at bedtime    aspirin EC 81 MG tablet Take 81 mg by mouth daily.    clopidogrel (PLAVIX) 75 MG tablet Take 1 tablet (75 mg total) by mouth daily. Qty: 30 tablet, Refills: 11    colchicine 0.6 MG tablet Take 0.6 mg by mouth daily.     levothyroxine (SYNTHROID, LEVOTHROID) 125 MCG tablet Take 125 mcg by mouth every morning.    nitroGLYCERIN (NITROSTAT) 0.4 MG SL tablet Place 0.4 mg under the tongue every 5 (five) minutes as needed for chest pain. Not to exceed 3 in 15 minute time frame    ondansetron (ZOFRAN ODT) 8 MG  disintegrating tablet Take 1 tablet (8 mg total) by mouth every 8 (eight) hours as needed for nausea or vomiting. Qty: 20 tablet, Refills: 0    gabapentin (NEURONTIN) 400 MG capsule Take 400 mg by mouth at bedtime.     NOVOLIN 70/30 RELION (70-30) 100 UNIT/ML injection Inject 20 Units into the skin  2 (two) times daily with a meal.       STOP taking these medications     metoprolol tartrate (LOPRESSOR) 25 MG tablet      isosorbide dinitrate (ISORDIL) 20 MG tablet      oxyCODONE-acetaminophen (PERCOCET) 10-325 MG per tablet         Procedures/Studies: Dg Chest 2 View  Result Date: 05/30/2017 CLINICAL DATA:  61 y/o  F; chest pain. EXAM: CHEST  2 VIEW COMPARISON:  04/15/2013 chest radiograph FINDINGS: Stable cardiac silhouette, borderline enlarged. No focal consolidation, pneumothorax, or effusion. Status post CABG with sternotomy wires in alignment. Right upper quadrant cholecystectomy clips. No acute osseous abnormality is evident. IMPRESSION: Stable borderline cardiomegaly. No acute pulmonary process identified. Electronically Signed   By: Kristine Garbe M.D.   On: 05/30/2017 22:46      Subjective: Pt says that she feels much better today.  No chest pain or SOB.    Discharge Exam: Vitals:   06/01/17 0821 06/01/17 0900  BP:  (!) 151/49  Pulse:  (!) 58  Resp:  16  Temp: 98 F (36.7 C)   SpO2:  97%   Vitals:   06/01/17 0700 06/01/17 0800 06/01/17 0821 06/01/17 0900  BP: (!) 146/48 (!) 141/56  (!) 151/49  Pulse: (!) 50 (!) 49  (!) 58  Resp: 16 19  16   Temp:   98 F (36.7 C)   TempSrc:   Axillary   SpO2: 97% 100%  97%  Weight:      Height:       General: Pt is alert, awake, not in acute distress Cardiovascular: RRR, S1/S2 +, no rubs, no gallops Respiratory: CTA bilaterally, no wheezing, no rhonchi Abdominal: Soft, NT, ND, bowel sounds + Extremities: no edema, no cyanosis   The results of significant diagnostics from this hospitalization (including imaging, microbiology, ancillary and laboratory) are listed below for reference.     Microbiology: Recent Results (from the past 240 hour(s))  MRSA PCR Screening     Status: None   Collection Time: 05/31/17  6:26 AM  Result Value Ref Range Status   MRSA by PCR NEGATIVE NEGATIVE Final    Comment:         The GeneXpert MRSA Assay (FDA approved for NASAL specimens only), is one component of a comprehensive MRSA colonization surveillance program. It is not intended to diagnose MRSA infection nor to guide or monitor treatment for MRSA infections.      Labs: BNP (last 3 results) No results for input(s): BNP in the last 8760 hours. Basic Metabolic Panel:  Recent Labs Lab 05/30/17 2217 05/31/17 0500 06/01/17 0235  NA 136 138 136  K 4.2 3.7 4.0  CL 102 102 106  CO2 25 28 26   GLUCOSE 178* 88 141*  BUN 30* 30* 22*  CREATININE 1.82* 1.74* 1.59*  CALCIUM 9.6 9.1 8.6*   Liver Function Tests: No results for input(s): AST, ALT, ALKPHOS, BILITOT, PROT, ALBUMIN in the last 168 hours. No results for input(s): LIPASE, AMYLASE in the last 168 hours. No results for input(s): AMMONIA in the last 168 hours. CBC:  Recent Labs Lab 05/30/17 2217 05/31/17 0743 06/01/17  0235  WBC 9.7 8.7 7.7  HGB 14.0 13.3 12.9  HCT 40.5 39.3 39.3  MCV 87.1 87.5 88.5  PLT 148* 134* 125*   Cardiac Enzymes:  Recent Labs Lab 05/30/17 2217 05/31/17 0500 05/31/17 1217 05/31/17 1900  TROPONINI 0.04* 0.07* 0.18* 0.20*   BNP: Invalid input(s): POCBNP CBG:  Recent Labs Lab 05/31/17 1201 05/31/17 1539 05/31/17 1839 05/31/17 2119 06/01/17 0822  GLUCAP 87 151* 114* 139* 130*   D-Dimer No results for input(s): DDIMER in the last 72 hours. Hgb A1c  Recent Labs  05/31/17 0743  HGBA1C 7.2*   Lipid Profile  Recent Labs  05/31/17 0743  CHOL 239*  HDL 35*  LDLCALC 165*  TRIG 193*  CHOLHDL 6.8   Thyroid function studies  Recent Labs  05/31/17 0743  TSH 9.088*   Anemia work up No results for input(s): VITAMINB12, FOLATE, FERRITIN, TIBC, IRON, RETICCTPCT in the last 72 hours. Urinalysis    Component Value Date/Time   COLORURINE YELLOW 08/13/2015 1840   APPEARANCEUR HAZY (A) 08/13/2015 1840   LABSPEC 1.015 08/13/2015 1840   PHURINE 6.0 08/13/2015 1840   GLUCOSEU NEGATIVE  08/13/2015 1840   HGBUR NEGATIVE 08/13/2015 1840   BILIRUBINUR NEGATIVE 08/13/2015 1840   KETONESUR NEGATIVE 08/13/2015 1840   PROTEINUR 100 (A) 08/13/2015 1840   UROBILINOGEN 0.2 10/26/2013 2229   NITRITE NEGATIVE 08/13/2015 1840   LEUKOCYTESUR TRACE (A) 08/13/2015 1840   Sepsis Labs Invalid input(s): PROCALCITONIN,  WBC,  LACTICIDVEN Microbiology Recent Results (from the past 240 hour(s))  MRSA PCR Screening     Status: None   Collection Time: 05/31/17  6:26 AM  Result Value Ref Range Status   MRSA by PCR NEGATIVE NEGATIVE Final    Comment:        The GeneXpert MRSA Assay (FDA approved for NASAL specimens only), is one component of a comprehensive MRSA colonization surveillance program. It is not intended to diagnose MRSA infection nor to guide or monitor treatment for MRSA infections.      Time coordinating discharge: 33 mins  SIGNED:  Irwin Brakeman, MD  [facility] 06/01/2017, 12:03 PM Pager [phone]  If 7PM-7AM, please contact night-coverage [email or web address] Password [email or web address]

## 2017-06-01 NOTE — Discharge Instructions (Signed)
Follow with Primary MD  Rory Percy, MD  and other consultant's as instructed your Hospitalist MD  Please get a complete blood count and chemistry panel checked by your Primary MD at your next visit, and again as instructed by your Primary MD.  Get Medicines reviewed and adjusted: Please take all your medications with you for your next visit with your Primary MD  Laboratory/radiological data: Please request your Primary MD to go over all hospital tests and procedure/radiological results at the follow up, please ask your Primary MD to get all Hospital records sent to his/her office.  In some cases, they will be blood work, cultures and biopsy results pending at the time of your discharge. Please request that your primary care M.D. follows up on these results.  Also Note the following: If you experience worsening of your admission symptoms, develop shortness of breath, life threatening emergency, suicidal or homicidal thoughts you must seek medical attention immediately by calling 911 or calling your MD immediately  if symptoms less severe.  You must read complete instructions/literature along with all the possible adverse reactions/side effects for all the Medicines you take and that have been prescribed to you. Take any new Medicines after you have completely understood and accpet all the possible adverse reactions/side effects.   Do not drive when taking Pain medications or sleeping medications (Benzodaizepines)  Do not take more than prescribed Pain, Sleep and Anxiety Medications. It is not advisable to combine anxiety,sleep and pain medications without talking with your primary care practitioner  Special Instructions: If you have smoked or chewed Tobacco  in the last 2 yrs please stop smoking, stop any regular Alcohol  and or any Recreational drug use.  Wear Seat belts while driving.  Please note: You were cared for by a hospitalist during your hospital stay. Once you are discharged,  your primary care physician will handle any further medical issues. Please note that NO REFILLS for any discharge medications will be authorized once you are discharged, as it is imperative that you return to your primary care physician (or establish a relationship with a primary care physician if you do not have one) for your post hospital discharge needs so that they can reassess your need for medications and monitor your lab values.

## 2017-06-01 NOTE — Progress Notes (Signed)
[id number] Observed pt walk with PT 370 ft without CP. To recliner after walk. MI education completed with pt who voiced understanding. Discussed importance of plavix with stent. Reviewed NTG use, importance of not smoking (congratulated pt on quitting), MI restrictions, watching carbs and heart healthy food choices and ex ed. Discussed CRP 2 and will refer to [place]. Pt stated her insurance would not cover last time she was referred. She is doubtful she will be able to attend.  Pt has been taking care of her mother and boyfriend in SNF.  Encouraged her to try to make time for herself also. Graylon Good RN BSN 06/01/2017 11:28 AM

## 2017-06-01 NOTE — Progress Notes (Signed)
PROGRESS NOTE   Donna Howe  EPP:[id number]  DOB: 1956/02/08  DOA: 05/30/2017 PCP: Rory Percy, MD   Brief Admission Hx: Donna Howe is a 61 y.o. female  past medical history is noted for asthma, anemia, coronary artery disease, hypertension, gout, myocardial infarction, bowel obstruction who presents with chest discomfort and NSTEMI.  MDM/Assessment & Plan:   NSTEMI - Pt was taken to cath yesterday and had angioplasty and stent placement.  Pt feels much better today.  Transfer to telemetry.  Cardiology following.    CAD - s/p stent placement and angioplasty.  Cardiology team has her on statin, plavix, metoprolol, aspirin.  Hypotension - seems to be resolved now. BPs are slightly elevated this morning.    Bradycardia - stable, following, does not seem to be causing any symptoms at this time.    Dyslipidemia- continue statin therapy, follow up at lipid clinic.   Essential Hypertension - on above meds, following, management by cardiology.   Type 2 diabetes with vascular complications - blood sugars stable, controlled, continue current management.  Follow BS closely. A1c is 7.2% which indicates poor outpatient control.  Follow up with PCP for ongoing outpatient management.   CBG (last 3)   Recent Labs  05/31/17 1539 05/31/17 1839 05/31/17 2119  GLUCAP 151* 114* 139*   Hypothyroidism - I suspect patient may have missed some doses of levothyroxine, would not change dose now in the setting of critical illness, but would have patient follow up outpatient with PCP to have TSH rechecked in 4-6 weeks.    CKD stage 3 - creatinine holding stable, following.   DVT prophylaxis: SCDs Code Status: FULL  Family Communication: none at bedside Disposition Plan: transfer to telemetry  Consultants:  cardiology  Procedures:  Cardiac cath 9/13   Subjective: Pt reports that she rested well and she feels much better, no chest pain or pressure  Objective: Vitals:   06/01/17 0300 06/01/17 0400 06/01/17 0500 06/01/17 0600  BP: (!) 117/48 (!) 143/45 (!) 102/45 (!) 147/50  Pulse: (!) 49 (!) 54 (!) 47 (!) 58  Resp: 14 14 15 13   Temp:  98.1 F (36.7 C)    TempSrc:  Oral    SpO2: 96% 96% 96% 98%  Weight:      Height:        Intake/Output Summary (Last 24 hours) at 06/01/17 0727 Last data filed at 06/01/17 0600  Gross per 24 hour  Intake          1426.95 ml  Output              950 ml  Net           476.95 ml   Filed Weights   05/30/17 2210 05/31/17 0600  Weight: 90.3 kg (199 lb) 104.1 kg (229 lb 8 oz)     REVIEW OF SYSTEMS  As per history otherwise all reviewed and reported negative  Exam:  General exam: awake, alert, NAD, cooperative.   Respiratory system: Clear. No increased work of breathing. Cardiovascular system: S1 & S2 heard, bradycardic. No JVD, murmurs, gallops, clicks or pedal edema. Gastrointestinal system: Abdomen is nondistended, soft and nontender. Normal bowel sounds heard. Central nervous system: Alert and oriented. No focal neurological deficits. Extremities: no CCE.  Data Reviewed: Basic Metabolic Panel:  Recent Labs Lab 05/30/17 2217 05/31/17 0500 06/01/17 0235  NA 136 138 136  K 4.2 3.7 4.0  CL 102 102 106  CO2 25 28 26   GLUCOSE 178* 88  141*  BUN 30* 30* 22*  CREATININE 1.82* 1.74* 1.59*  CALCIUM 9.6 9.1 8.6*   Liver Function Tests: No results for input(s): AST, ALT, ALKPHOS, BILITOT, PROT, ALBUMIN in the last 168 hours. No results for input(s): LIPASE, AMYLASE in the last 168 hours. No results for input(s): AMMONIA in the last 168 hours. CBC:  Recent Labs Lab 05/30/17 2217 05/31/17 0743 06/01/17 0235  WBC 9.7 8.7 7.7  HGB 14.0 13.3 12.9  HCT 40.5 39.3 39.3  MCV 87.1 87.5 88.5  PLT 148* 134* 125*   Cardiac Enzymes:  Recent Labs Lab 05/30/17 2217 05/31/17 0500 05/31/17 1217 05/31/17 1900  TROPONINI 0.04* 0.07* 0.18* 0.20*   CBG (last 3)   Recent Labs  05/31/17 1539 05/31/17 1839  05/31/17 2119  GLUCAP 151* 114* 139*   Recent Results (from the past 240 hour(s))  MRSA PCR Screening     Status: None   Collection Time: 05/31/17  6:26 AM  Result Value Ref Range Status   MRSA by PCR NEGATIVE NEGATIVE Final    Comment:        The GeneXpert MRSA Assay (FDA approved for NASAL specimens only), is one component of a comprehensive MRSA colonization surveillance program. It is not intended to diagnose MRSA infection nor to guide or monitor treatment for MRSA infections.      Studies: Dg Chest 2 View  Result Date: 05/30/2017 CLINICAL DATA:  61 y/o  F; chest pain. EXAM: CHEST  2 VIEW COMPARISON:  04/15/2013 chest radiograph FINDINGS: Stable cardiac silhouette, borderline enlarged. No focal consolidation, pneumothorax, or effusion. Status post CABG with sternotomy wires in alignment. Right upper quadrant cholecystectomy clips. No acute osseous abnormality is evident. IMPRESSION: Stable borderline cardiomegaly. No acute pulmonary process identified. Electronically Signed   By: Kristine Garbe M.D.   On: 05/30/2017 22:46    Scheduled Meds: . aspirin  81 mg Oral Daily  . atorvastatin  80 mg Oral Daily  . clopidogrel  75 mg Oral Q breakfast  . gabapentin  400 mg Oral BID  . insulin aspart  0-15 Units Subcutaneous TID WC  . insulin aspart protamine- aspart  10 Units Subcutaneous TID WC  . levothyroxine  125 mcg Oral QAC breakfast  . metoprolol tartrate  12.5 mg Oral BID  . sodium chloride flush  3 mL Intravenous Q12H   Continuous Infusions: . sodium chloride Stopped (05/31/17 2000)  . sodium chloride    . nitroGLYCERIN Stopped (05/31/17 1300)    Principal Problem:   ACS (acute coronary syndrome) (HCC) Active Problems:   Hyperlipidemia   Essential hypertension   Asthma   Chronic kidney disease (CKD), stage III (moderate)   Diabetes mellitus (HCC)   Acute chest pain   Non-ST elevation (NSTEMI) myocardial infarction [facility])   History of coronary artery  disease  Critical Care Time spent: 46 mins  Irwin Brakeman, MD, FAAFP [facility] Pager [phone]  If 7PM-7AM, please contact night-coverage [email or web address] Password [email or web address] 06/01/2017, 7:27 AM    LOS: 1 day

## 2017-06-01 NOTE — Progress Notes (Signed)
Right femoral sheath pulled at 0033. Manual pressure applied for 30 minutes.  Two RNs and atropine at bedside. VS consistent with baseline and stable throughout. Site currently a level "0."  ?  Pt educated on mobility restrictions and potential complications, including when to contact RN.   Pt tolerated procedure well. Will continue to monitor pt and site closely. ?  ---  Site area: Right Femoral Artery   Site Prior to Removal: Level 0   Pressure Applied For: 30 mins   Manual: Yes  Patient Status During Pull: Stable   Post Pull Site: Level 0   Post Pull Instructions Given: Yes   Post Pull Pulses Present: DP +3   Dressing Applied: Gauze & Pressure Tape  Bedrest begins @ 103  Comments: n/a

## 2017-06-15 NOTE — Progress Notes (Signed)
Cardiology Office Note  Date: 06/18/2017   ID: TIPHANI Donna Howe, DOB 10/26/1955, MRN [id number]  PCP: Rory Percy, MD  Primary Cardiologist: Rozann Lesches, MD   Chief Complaint  Patient presents with  . Coronary Artery Disease    History of Present Illness: Donna Howe is a 61 y.o. female last seen in March. Interval records were reviewed. She was recently hospitalized with chest pain and mild elevation of troponin I to 0.18. She underwent cardiac catheterization on September 13 demonstrating restenosis within the left main to circumflex stent up to 75% distally, patent LIMA to LAD, 75% proximal circumflex, 70-80% first obtuse marginal with patent stent site, occluded SVG to obtuse marginal, and 80% proximal SVG to RCA. Patient underwent cutting balloon angioplasty to address the proximal circumflex stenosis and placement of DES within the SVG to RCA.  She presents today for a follow-up visit. States she feels better. We went over her medications which are outlined below. She is not on beta blocker therapy due to bradycardia. Also taken off of Imdur. She does not report any bleeding problems on dual antiplatelet therapy.  I reviewed her cardiac catheterization report and echocardiogram. LVEF remains normal in the range of 60-65%.  She states that she has been compliant with Lipitor, dose was recently increased. Recent LDL was 165.  Past Medical History:  Diagnosis Date  . Anemia   . Asthma   . CAD (coronary artery disease)    Multivessel s/p CABG, occluded SVG to OM, DES to LM/circ 12/2012  . Carotid artery disease ([place])   . Chronic kidney disease (CKD), stage III (moderate) (HCC)   . Essential hypertension   . Gout   . Hyperlipidemia   . Hypothyroidism   . Myocardial infarction ([place])   . PAD (peripheral artery disease) ([place])    Dr. Kellie Simmering  . SBO (small bowel obstruction) ([place]) 2011   Status post lysis of adhesions & hernia repair  . Thrombocytopenia, unspecified  ([place])   . Type 2 diabetes mellitus ([place])   . Umbilical hernia     Past Surgical History:  Procedure Laterality Date  . CESAREAN SECTION  1984  . CHOLECYSTECTOMY  2010  . CORONARY ARTERY BYPASS GRAFT  2005  . CORONARY BALLOON ANGIOPLASTY N/A 05/31/2017   Procedure: CORONARY BALLOON ANGIOPLASTY;  Surgeon: Jettie Booze, MD;  Location: [place] CV LAB;  Service: Cardiovascular;  Laterality: N/A;  . CORONARY STENT INTERVENTION N/A 05/31/2017   Procedure: CORONARY STENT INTERVENTION;  Surgeon: Jettie Booze, MD;  Location: [place] CV LAB;  Service: Cardiovascular;  Laterality: N/A;  . ENDARTERECTOMY Right 04/18/2013   Procedure: ENDARTERECTOMY CAROTID;  Surgeon: Mal Misty, MD;  Location: [facility];  Service: Vascular;  Laterality: Right;  . HERNIA REPAIR  1989  . Incisional hernia repair x2  03/04/2010   Laparoscopic with 35cm mesh by Dr Ronnald Collum  . LEFT HEART CATH AND CORS/GRAFTS ANGIOGRAPHY N/A 05/31/2017   Procedure: LEFT HEART CATH AND CORS/GRAFTS ANGIOGRAPHY;  Surgeon: Jettie Booze, MD;  Location: [facility] CV LAB;  Service: Cardiovascular;  Laterality: N/A;  . LEFT HEART CATHETERIZATION WITH CORONARY ANGIOGRAM N/A 12/19/2012   Procedure: LEFT HEART CATHETERIZATION WITH CORONARY ANGIOGRAM;  Surgeon: Josue Hector, MD;  Location: [facility] CATH LAB;  Service: Cardiovascular;  Laterality: N/A;  . LEFT HEART CATHETERIZATION WITH CORONARY/GRAFT ANGIOGRAM N/A 04/19/2013   Procedure: LEFT HEART CATHETERIZATION WITH Beatrix Fetters;  Surgeon: Lorretta Harp, MD;  Location: [facility] CATH LAB;  Service:  Cardiovascular;  Laterality: N/A;  . PATCH ANGIOPLASTY Right 04/18/2013   Procedure: PATCH ANGIOPLASTY Right Internal Carotid Artery;  Surgeon: Mal Misty, MD;  Location: [facility];  Service: Vascular;  Laterality: Right;  . PERCUTANEOUS CORONARY STENT INTERVENTION (PCI-S) Right 12/19/2012   Procedure: PERCUTANEOUS CORONARY STENT INTERVENTION (PCI-S);  Surgeon: Josue Hector,  MD;  Location: [facility] CATH LAB;  Service: Cardiovascular;  Laterality: Right;  . SHOULDER SURGERY      Current Outpatient Prescriptions  Medication Sig Dispense Refill  . ALPRAZolam (XANAX) 0.5 MG tablet Take 1/2 tab by mouth every morning & evening and 1 tab at bedtime    . aspirin EC 81 MG tablet Take 81 mg by mouth daily.    Marland Kitchen atorvastatin (LIPITOR) 80 MG tablet Take 1 tablet (80 mg total) by mouth daily. 30 tablet 0  . clopidogrel (PLAVIX) 75 MG tablet Take 1 tablet (75 mg total) by mouth daily. 30 tablet 11  . colchicine 0.6 MG tablet Take 0.6 mg by mouth daily.     Marland Kitchen gabapentin (NEURONTIN) 400 MG capsule Take 400 mg by mouth at bedtime.     Marland Kitchen levothyroxine (SYNTHROID, LEVOTHROID) 125 MCG tablet Take 125 mcg by mouth every morning.    Marland Kitchen lisinopril (PRINIVIL,ZESTRIL) 40 MG tablet Take 40 mg by mouth daily.    . nitroGLYCERIN (NITROSTAT) 0.4 MG SL tablet Place 0.4 mg under the tongue every 5 (five) minutes as needed for chest pain. Not to exceed 3 in 15 minute time frame    . NOVOLIN 70/30 RELION (70-30) 100 UNIT/ML injection Inject 20 Units into the skin 2 (two) times daily with a meal.     . ondansetron (ZOFRAN ODT) 8 MG disintegrating tablet Take 1 tablet (8 mg total) by mouth every 8 (eight) hours as needed for nausea or vomiting. 20 tablet 0   No current facility-administered medications for this visit.    Allergies:  Penicillins   Social History: The patient  reports that she quit smoking about 4 years ago. Her smoking use included Cigarettes. She has a 20.00 pack-year smoking history. She has never used smokeless tobacco. She reports that she does not drink alcohol or use drugs.   ROS:  Please see the history of present illness. Otherwise, complete review of systems is positive for occasional palpitations, no dizziness or syncope.  All other systems are reviewed and negative.   Physical Exam: VS:  BP (!) 144/90 (BP Location: Right Arm)   Pulse (!) 59   Wt 200 lb (90.7 kg)   SpO2  95%   BMI 33.28 kg/m , BMI Body mass index is 33.28 kg/m.  Wt Readings from Last 3 Encounters:  06/18/17 200 lb (90.7 kg)  05/31/17 229 lb 8 oz (104.1 kg)  12/01/16 200 lb 9.6 oz (91 kg)    General: Obese woman, appears comfortable at rest. HEENT: Conjunctiva and lids normal, oropharynx clear. Neck: Supple, no elevated JVP, soft right carotid bruit, no thyromegaly. Lungs: Clear to auscultation, nonlabored breathing at rest. Cardiac: Regular rate and rhythm, no S3, 2/6 systolic murmur, no pericardial rub. Abdomen: Soft, nontender, bowel sounds present, no guarding or rebound. Extremities: No pitting edema, distal pulses 2+. Skin: Warm and dry. Musculoskeletal: No kyphosis. Neuropsychiatric: Alert and oriented x3, affect grossly appropriate.  ECG: I personally reviewed the tracing from 05/30/2017 which showed sinus rhythm with nonspecific T-wave changes.  Recent Labwork: 05/31/2017: TSH 9.088 06/01/2017: BUN 22; Creatinine, Ser 1.59; Hemoglobin 12.9; Platelets 125; Potassium 4.0; Sodium 136  Component Value Date/Time   CHOL 239 (H) 05/31/2017 0743   TRIG 193 (H) 05/31/2017 0743   HDL 35 (L) 05/31/2017 0743   CHOLHDL 6.8 05/31/2017 0743   VLDL 39 05/31/2017 0743   LDLCALC 165 (H) 05/31/2017 0743    Other Studies Reviewed Today:  Cardiac catheterization and PCI 05/31/2017:  Stent from left main to circumflex with restenosis, up to 75% in the distal portion.  Ost LAD to Prox LAD lesion, 80 %stenosed. LIMA to LAD is patent.  Ost 1st Mrg lesion, 80 %stenosed at ostium. 1st Mrg-1 lesion, 70 %stenosed in mid vessel. Unchanged from prior. Patent stent in mid OM1.  SVG to OM is occluded.  LV end diastolic pressure is normal.  There is no aortic valve stenosis.  Prox Cx lesion, 75 %stenosed, treated with 3.0 cutting balloon.  Post intervention, there is a 15% residual stenosis.  Severe disease of the proximal native RCA.  Origin lesion of SVG to RCA with catheter  dampening, 80 %stenosed. A STENT RESOLUTE ONYX 3.5X12 drug eluting stent was successfully placed.  Post intervention, there is a 0% residual stenosis.   Continue dual antiplatelet therapy for at least one year along with aggressive secondary prevention.  Echocardiogram 05/31/2017: Study Conclusions  - Left ventricle: The cavity size was normal. Wall thickness was   increased in a pattern of mild LVH. Systolic function was normal.   The estimated ejection fraction was in the range of 60% to 65%.   Wall motion was normal; there were no regional wall motion   abnormalities.  Assessment and Plan:  1. Multivessel CAD status post CABG. Recent cardiac catheterization is outlined above, she underwent cutting balloon angioplasty to the proximal circumflex and placement of DES within the SVG to RCA. She is symptomatically stable at this time and we will continue with medical therapy including dual antiplatelet therapy.  2. Mixed hyperlipidemia, now on high-dose Lipitor. Recent LDL of 165. Recheck FLP and LFTs in 3 months for visit.  3. CKD stage III, recent creatinine stable at 1.59.  4. Essential hypertension, continues on lisinopril. Keep follow-up with Dr. Nadara Mustard.  Current medicines were reviewed with the patient today.   Orders Placed This Encounter  Procedures  . Lipid Profile  . Hepatic function panel    Disposition: Follow-up in 3 months.  Signed, Satira Sark, MD, [facility] 06/18/2017 9:51 AM    [facility]. [address] [place], [place] [address] Phone: [phone]; Fax: [phone]

## 2017-06-18 ENCOUNTER — Ambulatory Visit (INDEPENDENT_AMBULATORY_CARE_PROVIDER_SITE_OTHER): Payer: Medicare HMO | Admitting: Cardiology

## 2017-06-18 ENCOUNTER — Encounter: Payer: Self-pay | Admitting: Cardiology

## 2017-06-18 VITALS — BP 144/90 | HR 59 | Wt 200.0 lb

## 2017-06-18 DIAGNOSIS — E782 Mixed hyperlipidemia: Secondary | ICD-10-CM | POA: Diagnosis not present

## 2017-06-18 DIAGNOSIS — N183 Chronic kidney disease, stage 3 unspecified: Secondary | ICD-10-CM

## 2017-06-18 DIAGNOSIS — I10 Essential (primary) hypertension: Secondary | ICD-10-CM

## 2017-06-18 DIAGNOSIS — I25119 Atherosclerotic heart disease of native coronary artery with unspecified angina pectoris: Secondary | ICD-10-CM | POA: Diagnosis not present

## 2017-06-18 NOTE — Patient Instructions (Signed)
Medication Instructions: No Change  Labwork: Lipid's and LFT's JUST BEFORE next visit in 2 months  Procedures/Testing: None ordered  Follow-Up: 3 months Dr Domenic Polite  Any Additional Special Instructions Will Be Listed Below (If Applicable).     If you need a refill on your cardiac medications before your next appointment, please call your pharmacy.

## 2017-07-01 DIAGNOSIS — I25721 Atherosclerosis of autologous artery coronary artery bypass graft(s) with angina pectoris with documented spasm: Secondary | ICD-10-CM | POA: Diagnosis not present

## 2017-07-24 DIAGNOSIS — Z6833 Body mass index (BMI) 33.0-33.9, adult: Secondary | ICD-10-CM | POA: Diagnosis not present

## 2017-07-24 DIAGNOSIS — L03115 Cellulitis of right lower limb: Secondary | ICD-10-CM | POA: Diagnosis not present

## 2017-07-27 DIAGNOSIS — Z1389 Encounter for screening for other disorder: Secondary | ICD-10-CM | POA: Diagnosis not present

## 2017-07-27 DIAGNOSIS — L03115 Cellulitis of right lower limb: Secondary | ICD-10-CM | POA: Diagnosis not present

## 2017-07-27 DIAGNOSIS — M1000 Idiopathic gout, unspecified site: Secondary | ICD-10-CM | POA: Diagnosis not present

## 2017-07-27 DIAGNOSIS — E1165 Type 2 diabetes mellitus with hyperglycemia: Secondary | ICD-10-CM | POA: Diagnosis not present

## 2017-07-27 DIAGNOSIS — Z6833 Body mass index (BMI) 33.0-33.9, adult: Secondary | ICD-10-CM | POA: Diagnosis not present

## 2017-08-21 DIAGNOSIS — Z6834 Body mass index (BMI) 34.0-34.9, adult: Secondary | ICD-10-CM | POA: Diagnosis not present

## 2017-08-21 DIAGNOSIS — R509 Fever, unspecified: Secondary | ICD-10-CM | POA: Diagnosis not present

## 2017-08-21 DIAGNOSIS — M79671 Pain in right foot: Secondary | ICD-10-CM | POA: Diagnosis not present

## 2017-08-21 DIAGNOSIS — M79672 Pain in left foot: Secondary | ICD-10-CM | POA: Diagnosis not present

## 2017-08-21 DIAGNOSIS — J069 Acute upper respiratory infection, unspecified: Secondary | ICD-10-CM | POA: Diagnosis not present

## 2017-08-31 DIAGNOSIS — M79671 Pain in right foot: Secondary | ICD-10-CM | POA: Diagnosis not present

## 2017-08-31 DIAGNOSIS — M79672 Pain in left foot: Secondary | ICD-10-CM | POA: Diagnosis not present

## 2017-09-06 ENCOUNTER — Other Ambulatory Visit: Payer: Self-pay

## 2017-09-06 DIAGNOSIS — M79671 Pain in right foot: Secondary | ICD-10-CM

## 2017-09-25 DIAGNOSIS — E7800 Pure hypercholesterolemia, unspecified: Secondary | ICD-10-CM | POA: Diagnosis not present

## 2017-09-25 DIAGNOSIS — E039 Hypothyroidism, unspecified: Secondary | ICD-10-CM | POA: Diagnosis not present

## 2017-09-25 DIAGNOSIS — I10 Essential (primary) hypertension: Secondary | ICD-10-CM | POA: Diagnosis not present

## 2017-09-25 DIAGNOSIS — E1165 Type 2 diabetes mellitus with hyperglycemia: Secondary | ICD-10-CM | POA: Diagnosis not present

## 2017-10-02 ENCOUNTER — Encounter (HOSPITAL_COMMUNITY): Payer: Medicare HMO

## 2017-10-04 ENCOUNTER — Ambulatory Visit (HOSPITAL_COMMUNITY)
Admission: RE | Admit: 2017-10-04 | Discharge: 2017-10-04 | Disposition: A | Discharge status: Home / Self Care | Payer: Medicare HMO | Source: Ambulatory Visit | Attending: Surgery | Admitting: Surgery

## 2017-10-04 DIAGNOSIS — M79671 Pain in right foot: Secondary | ICD-10-CM | POA: Diagnosis not present

## 2017-10-04 DIAGNOSIS — I70202 Unspecified atherosclerosis of native arteries of extremities, left leg: Secondary | ICD-10-CM | POA: Insufficient documentation

## 2017-10-04 LAB — VAS US LOWER EXTREMITY ARTERIAL DUPLEX
RATIBDISTSYS: 15 cm/s
RIGHT POST TIB DIST SYS: 23 cm/s
RPERPSV: 11 cm/s
RPOPDPSV: -26 cm/s
RPOPPPSV: 31 cm/s
Right super femoral dist sys PSV: -54 cm/s
Right super femoral mid sys PSV: -32 cm/s
Right super femoral prox sys PSV: -42 cm/s

## 2017-10-08 ENCOUNTER — Encounter: Payer: Medicare HMO | Admitting: Surgery

## 2017-12-31 DIAGNOSIS — I10 Essential (primary) hypertension: Secondary | ICD-10-CM | POA: Diagnosis not present

## 2017-12-31 DIAGNOSIS — E1165 Type 2 diabetes mellitus with hyperglycemia: Secondary | ICD-10-CM | POA: Diagnosis not present

## 2017-12-31 DIAGNOSIS — E7800 Pure hypercholesterolemia, unspecified: Secondary | ICD-10-CM | POA: Diagnosis not present

## 2017-12-31 DIAGNOSIS — I25721 Atherosclerosis of autologous artery coronary artery bypass graft(s) with angina pectoris with documented spasm: Secondary | ICD-10-CM | POA: Diagnosis not present

## 2018-01-03 DIAGNOSIS — Z6831 Body mass index (BMI) 31.0-31.9, adult: Secondary | ICD-10-CM | POA: Diagnosis not present

## 2018-01-03 DIAGNOSIS — E1165 Type 2 diabetes mellitus with hyperglycemia: Secondary | ICD-10-CM | POA: Diagnosis not present

## 2018-04-04 ENCOUNTER — Inpatient Hospital Stay (HOSPITAL_COMMUNITY)
Admission: EM | Admit: 2018-04-04 | Discharge: 2018-04-10 | DRG: 247 | Disposition: A | Discharge status: Home / Self Care | Payer: Medicare HMO | Attending: Cardiology | Admitting: Cardiology

## 2018-04-04 ENCOUNTER — Other Ambulatory Visit: Payer: Self-pay

## 2018-04-04 ENCOUNTER — Emergency Department (HOSPITAL_COMMUNITY): Payer: Medicare HMO

## 2018-04-04 ENCOUNTER — Encounter (HOSPITAL_COMMUNITY): Payer: Self-pay | Admitting: Emergency Medicine

## 2018-04-04 DIAGNOSIS — E785 Hyperlipidemia, unspecified: Secondary | ICD-10-CM | POA: Diagnosis not present

## 2018-04-04 DIAGNOSIS — Z8679 Personal history of other diseases of the circulatory system: Secondary | ICD-10-CM

## 2018-04-04 DIAGNOSIS — Z8249 Family history of ischemic heart disease and other diseases of the circulatory system: Secondary | ICD-10-CM

## 2018-04-04 DIAGNOSIS — I25110 Atherosclerotic heart disease of native coronary artery with unstable angina pectoris: Secondary | ICD-10-CM | POA: Diagnosis present

## 2018-04-04 DIAGNOSIS — D631 Anemia in chronic kidney disease: Secondary | ICD-10-CM | POA: Diagnosis present

## 2018-04-04 DIAGNOSIS — I209 Angina pectoris, unspecified: Secondary | ICD-10-CM | POA: Diagnosis not present

## 2018-04-04 DIAGNOSIS — R001 Bradycardia, unspecified: Secondary | ICD-10-CM | POA: Diagnosis present

## 2018-04-04 DIAGNOSIS — I200 Unstable angina: Secondary | ICD-10-CM | POA: Diagnosis not present

## 2018-04-04 DIAGNOSIS — D72829 Elevated white blood cell count, unspecified: Secondary | ICD-10-CM | POA: Diagnosis present

## 2018-04-04 DIAGNOSIS — J45909 Unspecified asthma, uncomplicated: Secondary | ICD-10-CM | POA: Diagnosis present

## 2018-04-04 DIAGNOSIS — Z91128 Patient's intentional underdosing of medication regimen for other reason: Secondary | ICD-10-CM

## 2018-04-04 DIAGNOSIS — I252 Old myocardial infarction: Secondary | ICD-10-CM | POA: Diagnosis not present

## 2018-04-04 DIAGNOSIS — E669 Obesity, unspecified: Secondary | ICD-10-CM | POA: Diagnosis present

## 2018-04-04 DIAGNOSIS — I2510 Atherosclerotic heart disease of native coronary artery without angina pectoris: Secondary | ICD-10-CM | POA: Diagnosis not present

## 2018-04-04 DIAGNOSIS — T82855A Stenosis of coronary artery stent, initial encounter: Secondary | ICD-10-CM | POA: Diagnosis not present

## 2018-04-04 DIAGNOSIS — I214 Non-ST elevation (NSTEMI) myocardial infarction: Secondary | ICD-10-CM | POA: Diagnosis not present

## 2018-04-04 DIAGNOSIS — E782 Mixed hyperlipidemia: Secondary | ICD-10-CM | POA: Diagnosis present

## 2018-04-04 DIAGNOSIS — E039 Hypothyroidism, unspecified: Secondary | ICD-10-CM | POA: Diagnosis present

## 2018-04-04 DIAGNOSIS — N186 End stage renal disease: Secondary | ICD-10-CM

## 2018-04-04 DIAGNOSIS — Z951 Presence of aortocoronary bypass graft: Secondary | ICD-10-CM | POA: Diagnosis not present

## 2018-04-04 DIAGNOSIS — I10 Essential (primary) hypertension: Secondary | ICD-10-CM | POA: Diagnosis present

## 2018-04-04 DIAGNOSIS — Z9049 Acquired absence of other specified parts of digestive tract: Secondary | ICD-10-CM | POA: Diagnosis not present

## 2018-04-04 DIAGNOSIS — N289 Disorder of kidney and ureter, unspecified: Secondary | ICD-10-CM

## 2018-04-04 DIAGNOSIS — E1122 Type 2 diabetes mellitus with diabetic chronic kidney disease: Secondary | ICD-10-CM

## 2018-04-04 DIAGNOSIS — R51 Headache: Secondary | ICD-10-CM | POA: Diagnosis not present

## 2018-04-04 DIAGNOSIS — I4581 Long QT syndrome: Secondary | ICD-10-CM | POA: Diagnosis not present

## 2018-04-04 DIAGNOSIS — Z6832 Body mass index (BMI) 32.0-32.9, adult: Secondary | ICD-10-CM

## 2018-04-04 DIAGNOSIS — I9589 Other hypotension: Secondary | ICD-10-CM | POA: Diagnosis present

## 2018-04-04 DIAGNOSIS — N183 Chronic kidney disease, stage 3 unspecified: Secondary | ICD-10-CM | POA: Diagnosis present

## 2018-04-04 DIAGNOSIS — E875 Hyperkalemia: Secondary | ICD-10-CM | POA: Diagnosis not present

## 2018-04-04 DIAGNOSIS — I340 Nonrheumatic mitral (valve) insufficiency: Secondary | ICD-10-CM | POA: Diagnosis not present

## 2018-04-04 DIAGNOSIS — Z95820 Peripheral vascular angioplasty status with implants and grafts: Secondary | ICD-10-CM

## 2018-04-04 DIAGNOSIS — I249 Acute ischemic heart disease, unspecified: Secondary | ICD-10-CM | POA: Diagnosis present

## 2018-04-04 DIAGNOSIS — Y831 Surgical operation with implant of artificial internal device as the cause of abnormal reaction of the patient, or of later complication, without mention of misadventure at the time of the procedure: Secondary | ICD-10-CM | POA: Diagnosis not present

## 2018-04-04 DIAGNOSIS — Z7902 Long term (current) use of antithrombotics/antiplatelets: Secondary | ICD-10-CM

## 2018-04-04 DIAGNOSIS — Z8349 Family history of other endocrine, nutritional and metabolic diseases: Secondary | ICD-10-CM

## 2018-04-04 DIAGNOSIS — Z833 Family history of diabetes mellitus: Secondary | ICD-10-CM

## 2018-04-04 DIAGNOSIS — T50996A Underdosing of other drugs, medicaments and biological substances, initial encounter: Secondary | ICD-10-CM | POA: Diagnosis present

## 2018-04-04 DIAGNOSIS — Z79899 Other long term (current) drug therapy: Secondary | ICD-10-CM

## 2018-04-04 DIAGNOSIS — I25810 Atherosclerosis of coronary artery bypass graft(s) without angina pectoris: Secondary | ICD-10-CM | POA: Diagnosis not present

## 2018-04-04 DIAGNOSIS — Z955 Presence of coronary angioplasty implant and graft: Secondary | ICD-10-CM | POA: Diagnosis not present

## 2018-04-04 DIAGNOSIS — R079 Chest pain, unspecified: Secondary | ICD-10-CM | POA: Diagnosis not present

## 2018-04-04 DIAGNOSIS — Z8719 Personal history of other diseases of the digestive system: Secondary | ICD-10-CM

## 2018-04-04 DIAGNOSIS — I1310 Hypertensive heart and chronic kidney disease without heart failure, with stage 1 through stage 4 chronic kidney disease, or unspecified chronic kidney disease: Secondary | ICD-10-CM | POA: Diagnosis not present

## 2018-04-04 DIAGNOSIS — R0789 Other chest pain: Secondary | ICD-10-CM | POA: Diagnosis not present

## 2018-04-04 DIAGNOSIS — E1151 Type 2 diabetes mellitus with diabetic peripheral angiopathy without gangrene: Secondary | ICD-10-CM | POA: Diagnosis not present

## 2018-04-04 DIAGNOSIS — Z880 Allergy status to penicillin: Secondary | ICD-10-CM

## 2018-04-04 DIAGNOSIS — M109 Gout, unspecified: Secondary | ICD-10-CM | POA: Diagnosis present

## 2018-04-04 DIAGNOSIS — Z79890 Hormone replacement therapy (postmenopausal): Secondary | ICD-10-CM

## 2018-04-04 DIAGNOSIS — Z7982 Long term (current) use of aspirin: Secondary | ICD-10-CM

## 2018-04-04 DIAGNOSIS — Z634 Disappearance and death of family member: Secondary | ICD-10-CM

## 2018-04-04 DIAGNOSIS — D649 Anemia, unspecified: Secondary | ICD-10-CM | POA: Diagnosis present

## 2018-04-04 DIAGNOSIS — Z87891 Personal history of nicotine dependence: Secondary | ICD-10-CM

## 2018-04-04 DIAGNOSIS — Z794 Long term (current) use of insulin: Secondary | ICD-10-CM

## 2018-04-04 DIAGNOSIS — E119 Type 2 diabetes mellitus without complications: Secondary | ICD-10-CM

## 2018-04-04 DIAGNOSIS — M7989 Other specified soft tissue disorders: Secondary | ICD-10-CM | POA: Diagnosis not present

## 2018-04-04 HISTORY — DX: Bradycardia, unspecified: R00.1

## 2018-04-04 HISTORY — DX: Peripheral vascular angioplasty status with implants and grafts: Z95.820

## 2018-04-04 LAB — TROPONIN I: Troponin I: 0.05 ng/mL (ref <0.03)

## 2018-04-04 LAB — BASIC METABOLIC PANEL
ANION GAP: 6 (ref 5–15)
BUN: 32 mg/dL — ABNORMAL HIGH (ref 6–20)
CO2: 27 mmol/L (ref 22–32)
CREATININE: 1.97 mg/dL — AB (ref 0.44–1.00)
Calcium: 9 mg/dL (ref 8.9–10.3)
Chloride: 106 mmol/L (ref 98–111)
GFR calc non Af Amer: 27 mL/min — ABNORMAL LOW (ref >60)
GFR, EST AFRICAN AMERICAN: 31 mL/min — AB (ref >60)
Glucose, Bld: 137 mg/dL — ABNORMAL HIGH (ref 70–99)
POTASSIUM: 4.3 mmol/L (ref 3.5–5.1)
SODIUM: 139 mmol/L (ref 135–145)

## 2018-04-04 LAB — CBC
HEMATOCRIT: 40.5 % (ref 36.0–46.0)
HEMOGLOBIN: 13.6 g/dL (ref 12.0–15.0)
MCH: 30.2 pg (ref 26.0–34.0)
MCHC: 33.6 g/dL (ref 30.0–36.0)
MCV: 90 fL (ref 78.0–100.0)
PLATELETS: 158 10*3/uL (ref 150–400)
RBC: 4.5 MIL/uL (ref 3.87–5.11)
RDW: 13.1 % (ref 11.5–15.5)
WBC: 11.7 10*3/uL — AB (ref 4.0–10.5)

## 2018-04-04 LAB — APTT: aPTT: 33 seconds (ref 24–36)

## 2018-04-04 LAB — PROTIME-INR
INR: 0.88
PROTHROMBIN TIME: 11.9 s (ref 11.4–15.2)

## 2018-04-04 MED ORDER — NITROGLYCERIN IN D5W 200-5 MCG/ML-% IV SOLN
5.0000 ug/min | Freq: Once | INTRAVENOUS | Status: AC
  Administered 2018-04-05: 5 ug/min via INTRAVENOUS
  Filled 2018-04-04: qty 250

## 2018-04-04 MED ORDER — NITROGLYCERIN 0.4 MG SL SUBL
0.4000 mg | SUBLINGUAL_TABLET | Freq: Once | SUBLINGUAL | Status: AC
  Administered 2018-04-04: 0.4 mg via SUBLINGUAL

## 2018-04-04 MED ORDER — NITROGLYCERIN 0.4 MG SL SUBL
0.4000 mg | SUBLINGUAL_TABLET | Freq: Once | SUBLINGUAL | Status: AC
  Administered 2018-04-04: 0.4 mg via SUBLINGUAL
  Filled 2018-04-04: qty 1

## 2018-04-04 MED ORDER — HEPARIN (PORCINE) IN NACL 100-0.45 UNIT/ML-% IJ SOLN
1400.0000 [IU]/h | INTRAMUSCULAR | Status: DC
  Administered 2018-04-04 – 2018-04-05 (×2): 950 [IU]/h via INTRAVENOUS
  Administered 2018-04-06: 1250 [IU]/h via INTRAVENOUS
  Administered 2018-04-07 – 2018-04-08 (×2): 1400 [IU]/h via INTRAVENOUS
  Filled 2018-04-04 (×5): qty 250

## 2018-04-04 MED ORDER — HEPARIN BOLUS VIA INFUSION
4000.0000 [IU] | Freq: Once | INTRAVENOUS | Status: AC
  Administered 2018-04-04: 4000 [IU] via INTRAVENOUS

## 2018-04-04 NOTE — ED Provider Notes (Addendum)
[facility] EMERGENCY DEPARTMENT Provider Note   CSN: [id number] Arrival date & time: 04/04/18  2132     History   Chief Complaint Chief Complaint  Patient presents with  . Chest Pain    HPI Donna Howe is a 62 y.o. female.  HPI Patient presents with chest pain.  Had episode yesterday but then again today.  The pain is in her mid chest and goes to the back.  It was a little sweaty and is a pressure.  Feels like her previous angina.  Has known coronary artery disease.  Has been there for most of the day today.  States she is out of her nitroglycerin at home but usually nitroglycerin would help with this.  Usually does not have pain that would last this long however.  Has some swelling in both her legs.  Reportedly has some "blockages" in her legs also. Past Medical History:  Diagnosis Date  . Anemia   . Asthma   . CAD (coronary artery disease)    Multivessel s/p CABG, occluded SVG to OM, DES to LM/circ 12/2012  . Carotid artery disease ([place])   . Chronic kidney disease (CKD), stage III (moderate) (HCC)   . Essential hypertension   . Gout   . Hyperlipidemia   . Hypothyroidism   . Myocardial infarction ([place])   . PAD (peripheral artery disease) ([place])    Dr. Kellie Simmering  . SBO (small bowel obstruction) ([place]) 2011   Status post lysis of adhesions & hernia repair  . Thrombocytopenia, unspecified ([place])   . Type 2 diabetes mellitus ([place])   . Umbilical hernia     Patient Active Problem List   Diagnosis Date Noted  . Status post coronary artery stent placement   . ACS (acute coronary syndrome) ([place]) 05/31/2017  . Acute chest pain   . Non-ST elevation (NSTEMI) myocardial infarction ([place])   . History of coronary artery disease   . Diabetes mellitus ([place]) 10/28/2013  . Hypoglycemia associated with diabetes ([place]) 10/28/2013  . Thrombocytopenia, unspecified ([place]) 10/28/2013  . Hypokalemia 10/27/2013  . Syncope 10/26/2013  . Anemia 10/26/2013  . Renal failure, acute on chronic  (HCC) 10/26/2013  . Fracture of toe of right foot 10/26/2013  . Umbilical hernia   . Carotid artery disease ([place])   . Occlusion and stenosis of carotid artery without mention of cerebral infarction 04/15/2013  . Hx of CABG   . Ejection fraction   . Atherosclerosis of native arteries of the extremities with intermittent claudication 02/11/2013  . Diabetes mellitus with renal manifestation ([place]) 01/21/2013  . Bradycardia 01/03/2013  . Chronic kidney disease (CKD), stage III (moderate) (HCC)   . Gout   . PROTEINURIA, MILD 01/18/2010  . TOBACCO ABUSE 08/27/2009  . Obesity (BMI 30-39.9) 08/26/2009  . Asthma 08/26/2009  . Hyperlipidemia 03/31/2007  . Essential hypertension 03/31/2007  . CAD (coronary artery disease) 03/31/2007    Past Surgical History:  Procedure Laterality Date  . CESAREAN SECTION  1984  . CHOLECYSTECTOMY  2010  . CORONARY ARTERY BYPASS GRAFT  2005  . CORONARY BALLOON ANGIOPLASTY N/A 05/31/2017   Procedure: CORONARY BALLOON ANGIOPLASTY;  Surgeon: Jettie Booze, MD;  Location: [place] CV LAB;  Service: Cardiovascular;  Laterality: N/A;  . CORONARY STENT INTERVENTION N/A 05/31/2017   Procedure: CORONARY STENT INTERVENTION;  Surgeon: Jettie Booze, MD;  Location: [place] CV LAB;  Service: Cardiovascular;  Laterality: N/A;  . ENDARTERECTOMY Right 04/18/2013   Procedure: ENDARTERECTOMY CAROTID;  Surgeon: Nelda Severe  Kellie Simmering, MD;  Location: [place];  Service: Vascular;  Laterality: Right;  . HERNIA REPAIR  1989  . Incisional hernia repair x2  03/04/2010   Laparoscopic with 35cm mesh by Dr Ronnald Collum  . LEFT HEART CATH AND CORS/GRAFTS ANGIOGRAPHY N/A 05/31/2017   Procedure: LEFT HEART CATH AND CORS/GRAFTS ANGIOGRAPHY;  Surgeon: Jettie Booze, MD;  Location: [place] CV LAB;  Service: Cardiovascular;  Laterality: N/A;  . LEFT HEART CATHETERIZATION WITH CORONARY ANGIOGRAM N/A 12/19/2012   Procedure: LEFT HEART CATHETERIZATION WITH CORONARY ANGIOGRAM;   Surgeon: Josue Hector, MD;  Location: [facility] CATH LAB;  Service: Cardiovascular;  Laterality: N/A;  . LEFT HEART CATHETERIZATION WITH CORONARY/GRAFT ANGIOGRAM N/A 04/19/2013   Procedure: LEFT HEART CATHETERIZATION WITH Beatrix Fetters;  Surgeon: Lorretta Harp, MD;  Location: [facility] CATH LAB;  Service: Cardiovascular;  Laterality: N/A;  . PATCH ANGIOPLASTY Right 04/18/2013   Procedure: PATCH ANGIOPLASTY Right Internal Carotid Artery;  Surgeon: Mal Misty, MD;  Location: [place];  Service: Vascular;  Laterality: Right;  . PERCUTANEOUS CORONARY STENT INTERVENTION (PCI-S) Right 12/19/2012   Procedure: PERCUTANEOUS CORONARY STENT INTERVENTION (PCI-S);  Surgeon: Josue Hector, MD;  Location: [facility] CATH LAB;  Service: Cardiovascular;  Laterality: Right;  . SHOULDER SURGERY       OB History   None      Home Medications    Prior to Admission medications   Medication Sig Start Date End Date Taking? Authorizing Provider  ALPRAZolam Duanne Moron) 0.5 MG tablet Take 1/2 tab by mouth every morning & evening and 1 tab at bedtime    [provider]  aspirin EC 81 MG tablet Take 81 mg by mouth daily.    [provider]  atorvastatin (LIPITOR) 80 MG tablet Take 1 tablet (80 mg total) by mouth daily. 06/01/17   Johnson, Clanford L, MD  clopidogrel (PLAVIX) 75 MG tablet Take 1 tablet (75 mg total) by mouth daily. 12/20/12   Barrett, Evelene Croon, PA-C  colchicine 0.6 MG tablet Take 0.6 mg by mouth daily.     [provider]  gabapentin (NEURONTIN) 400 MG capsule Take 400 mg by mouth at bedtime.     Rory Percy, MD  levothyroxine (SYNTHROID, LEVOTHROID) 125 MCG tablet Take 125 mcg by mouth every morning.    [provider]  lisinopril (PRINIVIL,ZESTRIL) 40 MG tablet Take 40 mg by mouth daily.    [provider]  nitroGLYCERIN (NITROSTAT) 0.4 MG SL tablet Place 0.4 mg under the tongue every 5 (five) minutes as needed for chest pain. Not to exceed 3 in 15 minute time frame     [provider]  NOVOLIN 70/30 RELION (70-30) 100 UNIT/ML injection Inject 20 Units into the skin 2 (two) times daily with a meal.  08/28/15   [provider]  ondansetron (ZOFRAN ODT) 8 MG disintegrating tablet Take 1 tablet (8 mg total) by mouth every 8 (eight) hours as needed for nausea or vomiting. 08/14/15   Evalee Jefferson, PA-C    Family History Family History  Problem Relation Age of Onset  . Diabetes Mother   . Heart disease Mother        before age 19  . Hyperlipidemia Mother   . Hypertension Mother   . Thyroid disease Father   . Hypertension Father   . AAA (abdominal aortic aneurysm) Father   . Heart disease Brother        before age 69  . Hypertension Brother   . Hyperlipidemia Son   .  Hypertension Son     Social History Social History   Tobacco Use  . Smoking status: Former Smoker    Packs/day: 1.00    Years: 20.00    Pack years: 20.00    Types: Cigarettes    Last attempt to quit: 12/10/2012    Years since quitting: 5.3  . Smokeless tobacco: Never Used  Substance Use Topics  . Alcohol use: No    Alcohol/week: 0.0 oz  . Drug use: No     Allergies   Penicillins   Review of Systems Review of Systems  Constitutional: Positive for diaphoresis. Negative for appetite change.  HENT: Positive for congestion.   Respiratory: Negative for shortness of breath.   Cardiovascular: Positive for chest pain and leg swelling.  Gastrointestinal: Negative for abdominal pain.  Genitourinary: Negative for flank pain.  Musculoskeletal: Negative for back pain.  Skin: Negative for rash.  Neurological: Negative for numbness.  Hematological: Negative for adenopathy.  Psychiatric/Behavioral: Negative for confusion.     Physical Exam Updated Vital Signs BP (!) 161/52 (BP Location: Left Arm)   Pulse 66   Temp 98 F (36.7 C) (Oral)   Resp 18   Ht 5\' 5"  (1.651 m)   Wt 90.7 kg (200 lb)   SpO2 98%   BMI 33.28 kg/m   Physical Exam  Constitutional: She  appears well-developed.  HENT:  Head: Normocephalic.  Eyes: Pupils are equal, round, and reactive to light.  Cardiovascular: Normal rate.  Pulmonary/Chest: Effort normal.  Scattered harsh breath sounds.  Musculoskeletal:       Right lower leg: She exhibits edema.       Left lower leg: She exhibits edema.  Patient has edema over both lower extremities.  Tenderness over palpation of left first toe.  However good capillary refill on this toe.  Neurological: She is alert.  Skin: Skin is warm. Capillary refill takes less than 2 seconds.     ED Treatments / Results  Labs (all labs ordered are listed, but only abnormal results are displayed) Labs Reviewed  BASIC METABOLIC PANEL - Abnormal; Notable for the following components:      Result Value   Glucose, Bld 137 (*)    BUN 32 (*)    Creatinine, Ser 1.97 (*)    GFR calc non Af Amer 27 (*)    GFR calc Af Amer 31 (*)    All other components within normal limits  CBC - Abnormal; Notable for the following components:   WBC 11.7 (*)    All other components within normal limits  TROPONIN I - Abnormal; Notable for the following components:   Troponin I 0.05 (*)    All other components within normal limits  APTT  PROTIME-INR    EKG EKG Interpretation  Date/Time:  Thursday April 04 2018 21:43:43 EDT Ventricular Rate:  71 PR Interval:  170 QRS Duration: 96 QT Interval:  426 QTC Calculation: 462 R Axis:   76 Text Interpretation:  Normal sinus rhythm ST & T wave abnormality, consider lateral ischemia Prolonged QT Abnormal ECG No significant change since last tracing Confirmed by Davonna Belling [phone]) on 04/04/2018 10:40:01 PM   Radiology Dg Chest 2 View  Result Date: 04/04/2018 CLINICAL DATA:  Chest pain EXAM: CHEST - 2 VIEW COMPARISON:  05/30/2017 FINDINGS: Mild cardiomegaly. Remote median sternotomy. Lungs are clear. No pleural effusion or pneumothorax. IMPRESSION: Mild cardiomegaly without active airspace disease.  Electronically Signed   By: Ulyses Jarred M.D.   On: 04/04/2018 22:10  Procedures Procedures (including critical care time)  Medications Ordered in ED Medications  heparin bolus via infusion 4,000 Units (4,000 Units Intravenous Bolus from Bag 04/04/18 2338)    Followed by  heparin ADULT infusion 100 units/mL (25000 units/252mL sodium chloride 0.45%) (950 Units/hr Intravenous New Bag/Given 04/04/18 2335)  nitroGLYCERIN 50 mg in dextrose 5 % 250 mL (0.2 mg/mL) infusion (has no administration in time range)  nitroGLYCERIN (NITROSTAT) SL tablet 0.4 mg (0.4 mg Sublingual Given 04/04/18 2312)  nitroGLYCERIN (NITROSTAT) SL tablet 0.4 mg (0.4 mg Sublingual Given 04/04/18 2330)     Initial Impression / Assessment and Plan / ED Course  I have reviewed the triage vital signs and the nursing notes.  Pertinent labs & imaging results that were available during my care of the patient were reviewed by me and considered in my medical decision making (see chart for details).      Patient patient with chest pain.  Dull in her mid chest like previous angina.  Came on at rest.  Unstable angina.  EKG reassuring.  Will require admission.  Started on heparin drip and nitroglycerin drip.  CRITICAL CARE Performed by: Davonna Belling Total critical care time: 30 minutes Critical care time was exclusive of separately billable procedures and treating other patients. Critical care was necessary to treat or prevent imminent or life-threatening deterioration. Critical care was time spent personally by me on the following activities: development of treatment plan with patient and/or surrogate as well as nursing, discussions with consultants, evaluation of patient's response to treatment, examination of patient, obtaining history from patient or surrogate, ordering and performing treatments and interventions, ordering and review of laboratory studies, ordering and review of radiographic studies, pulse oximetry and  re-evaluation of patient's condition.  Troponin minimally elevated at 0.05. Will admit.  Final Clinical Impressions(s) / ED Diagnoses   Final diagnoses:  Unstable angina [facility])  Renal insufficiency    ED Discharge Orders    None       Davonna Belling, MD 04/05/18 0006    Davonna Belling, MD 04/05/18 [phone]

## 2018-04-04 NOTE — Progress Notes (Signed)
ANTICOAGULATION CONSULT NOTE - Preliminary  Pharmacy Consult for heparin Indication: chest pain/ACS  Allergies  Allergen Reactions  . Penicillins Other (See Comments)    REACTION: Unknown, told as a child    Patient Measurements: Height: 5\' 5"  (165.1 cm) Weight: 200 lb (90.7 kg) IBW/kg (Calculated) : 57 HEPARIN DW (KG): 77.1   Vital Signs: Temp: 98 F (36.7 C) (07/18 2147) Temp Source: Oral (07/18 2147) BP: 161/52 (07/18 2147) Pulse Rate: 66 (07/18 2147)  Labs: No results for input(s): HGB, HCT, PLT, APTT, LABPROT, INR, HEPARINUNFRC, CREATININE, CKTOTAL, CKMB, TROPONINI in the last 72 hours. CrCl cannot be calculated (Patient's most recent lab result is older than the maximum 21 days allowed.).  Medical History: Past Medical History:  Diagnosis Date  . Anemia   . Asthma   . CAD (coronary artery disease)    Multivessel s/p CABG, occluded SVG to OM, DES to LM/circ 12/2012  . Carotid artery disease ([place])   . Chronic kidney disease (CKD), stage III (moderate) (HCC)   . Essential hypertension   . Gout   . Hyperlipidemia   . Hypothyroidism   . Myocardial infarction ([place])   . PAD (peripheral artery disease) ([place])    Dr. Kellie Simmering  . SBO (small bowel obstruction) ([place]) 2011   Status post lysis of adhesions & hernia repair  . Thrombocytopenia, unspecified ([place])   . Type 2 diabetes mellitus ([place])   . Umbilical hernia     Medications:  Scheduled:  . heparin  4,000 Units Intravenous Once  . nitroGLYCERIN  0.4 mg Sublingual Once   Infusions:  . heparin      Assessment: 62 yo female c/o chest pain with diaphoresis, left arm pain and back pain.  Starting heparin.   Goal of Therapy:  Heparin level 0.3-0.7 units/ml   Plan:  Give 4000 units bolus x 1 Start heparin infusion at 950 units/hr Check anti-Xa level in 6 hours and daily while on heparin Continue to monitor H&H and platelets Preliminary review of pertinent patient information completed.  Forestine Na clinical  pharmacist will complete review during morning rounds to assess the patient and finalize treatment regimen.  Nyra Capes, [facility] 04/04/2018,11:24 PM

## 2018-04-04 NOTE — ED Triage Notes (Signed)
Pt C/O chest pain that started yesterday then resolved. Pt states the chest pain returned today along with diaphoresis, left arm pain, and back pain.

## 2018-04-05 ENCOUNTER — Inpatient Hospital Stay (HOSPITAL_COMMUNITY): Payer: Medicare HMO

## 2018-04-05 ENCOUNTER — Encounter (HOSPITAL_COMMUNITY): Payer: Self-pay | Admitting: *Deleted

## 2018-04-05 ENCOUNTER — Other Ambulatory Visit: Payer: Self-pay

## 2018-04-05 DIAGNOSIS — I214 Non-ST elevation (NSTEMI) myocardial infarction: Secondary | ICD-10-CM | POA: Diagnosis not present

## 2018-04-05 DIAGNOSIS — Z951 Presence of aortocoronary bypass graft: Secondary | ICD-10-CM | POA: Diagnosis not present

## 2018-04-05 DIAGNOSIS — I10 Essential (primary) hypertension: Secondary | ICD-10-CM

## 2018-04-05 DIAGNOSIS — I340 Nonrheumatic mitral (valve) insufficiency: Secondary | ICD-10-CM | POA: Diagnosis not present

## 2018-04-05 DIAGNOSIS — N183 Chronic kidney disease, stage 3 (moderate): Secondary | ICD-10-CM | POA: Diagnosis not present

## 2018-04-05 DIAGNOSIS — I209 Angina pectoris, unspecified: Secondary | ICD-10-CM | POA: Diagnosis present

## 2018-04-05 DIAGNOSIS — T50996A Underdosing of other drugs, medicaments and biological substances, initial encounter: Secondary | ICD-10-CM | POA: Diagnosis present

## 2018-04-05 DIAGNOSIS — Z955 Presence of coronary angioplasty implant and graft: Secondary | ICD-10-CM | POA: Diagnosis not present

## 2018-04-05 DIAGNOSIS — I25810 Atherosclerosis of coronary artery bypass graft(s) without angina pectoris: Secondary | ICD-10-CM | POA: Diagnosis not present

## 2018-04-05 DIAGNOSIS — J45909 Unspecified asthma, uncomplicated: Secondary | ICD-10-CM | POA: Diagnosis present

## 2018-04-05 DIAGNOSIS — E039 Hypothyroidism, unspecified: Secondary | ICD-10-CM | POA: Diagnosis present

## 2018-04-05 DIAGNOSIS — Y831 Surgical operation with implant of artificial internal device as the cause of abnormal reaction of the patient, or of later complication, without mention of misadventure at the time of the procedure: Secondary | ICD-10-CM | POA: Diagnosis not present

## 2018-04-05 DIAGNOSIS — I252 Old myocardial infarction: Secondary | ICD-10-CM | POA: Diagnosis not present

## 2018-04-05 DIAGNOSIS — E669 Obesity, unspecified: Secondary | ICD-10-CM | POA: Diagnosis not present

## 2018-04-05 DIAGNOSIS — E1122 Type 2 diabetes mellitus with diabetic chronic kidney disease: Secondary | ICD-10-CM | POA: Diagnosis not present

## 2018-04-05 DIAGNOSIS — E782 Mixed hyperlipidemia: Secondary | ICD-10-CM | POA: Diagnosis not present

## 2018-04-05 DIAGNOSIS — I1310 Hypertensive heart and chronic kidney disease without heart failure, with stage 1 through stage 4 chronic kidney disease, or unspecified chronic kidney disease: Secondary | ICD-10-CM | POA: Diagnosis not present

## 2018-04-05 DIAGNOSIS — E785 Hyperlipidemia, unspecified: Secondary | ICD-10-CM

## 2018-04-05 DIAGNOSIS — I200 Unstable angina: Secondary | ICD-10-CM | POA: Diagnosis present

## 2018-04-05 DIAGNOSIS — E875 Hyperkalemia: Secondary | ICD-10-CM | POA: Diagnosis not present

## 2018-04-05 DIAGNOSIS — T82855A Stenosis of coronary artery stent, initial encounter: Secondary | ICD-10-CM | POA: Diagnosis not present

## 2018-04-05 DIAGNOSIS — R51 Headache: Secondary | ICD-10-CM | POA: Diagnosis not present

## 2018-04-05 DIAGNOSIS — R001 Bradycardia, unspecified: Secondary | ICD-10-CM | POA: Diagnosis present

## 2018-04-05 DIAGNOSIS — I249 Acute ischemic heart disease, unspecified: Secondary | ICD-10-CM | POA: Diagnosis not present

## 2018-04-05 DIAGNOSIS — I2510 Atherosclerotic heart disease of native coronary artery without angina pectoris: Secondary | ICD-10-CM | POA: Diagnosis not present

## 2018-04-05 DIAGNOSIS — D72829 Elevated white blood cell count, unspecified: Secondary | ICD-10-CM | POA: Diagnosis not present

## 2018-04-05 DIAGNOSIS — I4581 Long QT syndrome: Secondary | ICD-10-CM | POA: Diagnosis not present

## 2018-04-05 DIAGNOSIS — D631 Anemia in chronic kidney disease: Secondary | ICD-10-CM | POA: Diagnosis present

## 2018-04-05 DIAGNOSIS — Z9049 Acquired absence of other specified parts of digestive tract: Secondary | ICD-10-CM | POA: Diagnosis not present

## 2018-04-05 DIAGNOSIS — M109 Gout, unspecified: Secondary | ICD-10-CM | POA: Diagnosis present

## 2018-04-05 DIAGNOSIS — I25110 Atherosclerotic heart disease of native coronary artery with unstable angina pectoris: Secondary | ICD-10-CM | POA: Diagnosis not present

## 2018-04-05 DIAGNOSIS — E1151 Type 2 diabetes mellitus with diabetic peripheral angiopathy without gangrene: Secondary | ICD-10-CM | POA: Diagnosis not present

## 2018-04-05 LAB — TROPONIN I
TROPONIN I: 0.08 ng/mL — AB (ref <0.03)
Troponin I: 0.2 ng/mL (ref <0.03)
Troponin I: 0.23 ng/mL (ref <0.03)

## 2018-04-05 LAB — GLUCOSE, CAPILLARY
GLUCOSE-CAPILLARY: 117 mg/dL — AB (ref 70–99)
Glucose-Capillary: 148 mg/dL — ABNORMAL HIGH (ref 70–99)
Glucose-Capillary: 183 mg/dL — ABNORMAL HIGH (ref 70–99)
Glucose-Capillary: 100 mg/dL — ABNORMAL HIGH (ref 70–99)
Glucose-Capillary: 163 mg/dL — ABNORMAL HIGH (ref 70–99)

## 2018-04-05 LAB — HEPARIN LEVEL (UNFRACTIONATED)
HEPARIN UNFRACTIONATED: 0.21 [IU]/mL — AB (ref 0.30–0.70)
Heparin Unfractionated: 0.28 IU/mL — ABNORMAL LOW (ref 0.30–0.70)

## 2018-04-05 LAB — MRSA PCR SCREENING: MRSA BY PCR: NEGATIVE

## 2018-04-05 LAB — ECHOCARDIOGRAM COMPLETE
HEIGHTINCHES: 65 in
Weight: 3273.39 oz

## 2018-04-05 MED ORDER — LEVOTHYROXINE SODIUM 125 MCG PO TABS
125.0000 ug | ORAL_TABLET | ORAL | Status: DC
Start: 2018-04-05 — End: 2018-04-06
  Administered 2018-04-05: 125 ug via ORAL
  Filled 2018-04-05: qty 2.5
  Filled 2018-04-05 (×3): qty 1

## 2018-04-05 MED ORDER — ATORVASTATIN CALCIUM 80 MG PO TABS
80.0000 mg | ORAL_TABLET | Freq: Every day | ORAL | Status: DC
  Administered 2018-04-05 – 2018-04-10 (×6): 80 mg via ORAL
  Filled 2018-04-05 (×4): qty 1
  Filled 2018-04-05: qty 2
  Filled 2018-04-05: qty 1

## 2018-04-05 MED ORDER — INSULIN ASPART 100 UNIT/ML ~~LOC~~ SOLN
0.0000 [IU] | SUBCUTANEOUS | Status: DC
  Administered 2018-04-05 (×2): 2 [IU] via SUBCUTANEOUS
  Administered 2018-04-05: 1 [IU] via SUBCUTANEOUS

## 2018-04-05 MED ORDER — AMLODIPINE BESYLATE 10 MG PO TABS
10.0000 mg | ORAL_TABLET | Freq: Every day | ORAL | Status: DC
  Administered 2018-04-05 – 2018-04-10 (×6): 10 mg via ORAL
  Filled 2018-04-05 (×2): qty 1
  Filled 2018-04-05: qty 2
  Filled 2018-04-05 (×3): qty 1

## 2018-04-05 MED ORDER — ASPIRIN EC 81 MG PO TBEC
81.0000 mg | DELAYED_RELEASE_TABLET | Freq: Every day | ORAL | Status: DC
  Administered 2018-04-05 – 2018-04-07 (×3): 81 mg via ORAL
  Filled 2018-04-05 (×3): qty 1

## 2018-04-05 MED ORDER — INSULIN ASPART 100 UNIT/ML ~~LOC~~ SOLN: 0.0000 [IU] | Freq: Every day | SUBCUTANEOUS | Status: DC

## 2018-04-05 MED ORDER — ONDANSETRON HCL 4 MG/2ML IJ SOLN: 4.0000 mg | Freq: Four times a day (QID) | INTRAMUSCULAR | Status: DC | PRN

## 2018-04-05 MED ORDER — MORPHINE SULFATE (PF) 2 MG/ML IV SOLN
2.0000 mg | INTRAVENOUS | Status: DC | PRN
  Administered 2018-04-05: 2 mg via INTRAVENOUS
  Filled 2018-04-05: qty 1

## 2018-04-05 MED ORDER — GABAPENTIN 400 MG PO CAPS
400.0000 mg | ORAL_CAPSULE | Freq: Every day | ORAL | Status: DC
  Administered 2018-04-05 – 2018-04-09 (×6): 400 mg via ORAL
  Filled 2018-04-05 (×6): qty 1

## 2018-04-05 MED ORDER — SODIUM CHLORIDE 0.9 % IV SOLN
INTRAVENOUS | Status: AC
  Administered 2018-04-05 – 2018-04-06 (×2): via INTRAVENOUS

## 2018-04-05 MED ORDER — CLOPIDOGREL BISULFATE 75 MG PO TABS
75.0000 mg | ORAL_TABLET | Freq: Every day | ORAL | Status: DC
  Administered 2018-04-05 – 2018-04-09 (×5): 75 mg via ORAL
  Filled 2018-04-05 (×5): qty 1

## 2018-04-05 MED ORDER — METOPROLOL TARTRATE 25 MG PO TABS
25.0000 mg | ORAL_TABLET | Freq: Two times a day (BID) | ORAL | Status: DC
  Administered 2018-04-05 (×2): 25 mg via ORAL
  Filled 2018-04-05 (×8): qty 1

## 2018-04-05 MED ORDER — ACETAMINOPHEN 325 MG PO TABS
650.0000 mg | ORAL_TABLET | Freq: Four times a day (QID) | ORAL | Status: DC | PRN
  Administered 2018-04-06 – 2018-04-07 (×3): 650 mg via ORAL
  Filled 2018-04-05 (×3): qty 2

## 2018-04-05 MED ORDER — ACETAMINOPHEN 650 MG RE SUPP: 650.0000 mg | Freq: Four times a day (QID) | RECTAL | Status: DC | PRN

## 2018-04-05 MED ORDER — METOPROLOL TARTRATE 5 MG/5ML IV SOLN
5.0000 mg | INTRAVENOUS | Status: DC | PRN
  Filled 2018-04-05: qty 5

## 2018-04-05 MED ORDER — SODIUM CHLORIDE 0.9 % IV BOLUS: 500.0000 mL | Freq: Once | INTRAVENOUS | Status: DC

## 2018-04-05 MED ORDER — ONDANSETRON HCL 4 MG PO TABS: 4.0000 mg | ORAL_TABLET | Freq: Four times a day (QID) | ORAL | Status: DC | PRN

## 2018-04-05 MED ORDER — SODIUM CHLORIDE 0.9 % IV SOLN: 250.0000 mL | INTRAVENOUS | Status: DC | PRN

## 2018-04-05 MED ORDER — MORPHINE SULFATE (PF) 2 MG/ML IV SOLN
2.0000 mg | Freq: Once | INTRAVENOUS | Status: AC
  Administered 2018-04-05: 2 mg via INTRAVENOUS
  Filled 2018-04-05: qty 1

## 2018-04-05 MED ORDER — HEPARIN BOLUS VIA INFUSION
1200.0000 [IU] | Freq: Once | INTRAVENOUS | Status: AC
  Administered 2018-04-05: 1200 [IU] via INTRAVENOUS
  Filled 2018-04-05: qty 1200

## 2018-04-05 MED ORDER — NITROGLYCERIN IN D5W 200-5 MCG/ML-% IV SOLN
0.0000 ug/min | INTRAVENOUS | Status: DC
  Administered 2018-04-05: 5 ug/min via INTRAVENOUS

## 2018-04-05 MED ORDER — SODIUM CHLORIDE 0.9% FLUSH
3.0000 mL | Freq: Two times a day (BID) | INTRAVENOUS | Status: DC
  Administered 2018-04-05 (×2): 3 mL via INTRAVENOUS

## 2018-04-05 MED ORDER — SODIUM CHLORIDE 0.9% FLUSH: 3.0000 mL | INTRAVENOUS | Status: DC | PRN

## 2018-04-05 NOTE — Progress Notes (Signed)
Patient admitted to 6E29 from [facility] via [place].  Bed in low position, wheels locked.  Patient denies chest pain/shortness of breath.  Portable telemetry monitor applied.  Patient oriented to environment, including call bell, TV, meal times, and hourly rounding.

## 2018-04-05 NOTE — Progress Notes (Signed)
*  PRELIMINARY RESULTS* Echocardiogram 2D Echocardiogram has been performed.  Leavy Cella 04/05/2018, 2:25 PM

## 2018-04-05 NOTE — Progress Notes (Signed)
Patient seen and examined.  Admitted early this morning for NSTEMI.  Was still complaining of some chest pressure this morning although improved since admission.  Vitals stable. Seen by cardiology this morning and recommend transfer to [facility] under their service for cardiac cath on Monday..  Will sign off.

## 2018-04-05 NOTE — Progress Notes (Addendum)
Spoke with cath lab to add on for LCP+grafts on Monday 04/08/18, they have posted with Dr. Irish Lack.  Dr. Harl Bowie recommended IV fluids for hydration over the weekend which he has ordered. He recommends transfer to [facility] today on our service.  She will need monitoring of renal function over the weekend - if creatinine remains stable, she will need pre-cath orders by the weekend team.   I called [facility] for transfer and bed request (stepdown). Transfer orders and EMTALA done (tele bed requested per Dr. Harl Bowie). Updated Dr. Clementeen Graham and Wannetta Sender, cardmaster at [facility]. She'll need to be updated with our service's attending once she arrives to [facility].  Arden Tinoco PA-C

## 2018-04-05 NOTE — ED Notes (Signed)
Date and time results received: 04/05/18 2358 (use smartphrase ".now" to insert current time)  Test: troponin Critical Value: 0.05  Name of Provider Notified: Dr. Alvino Chapel notified at [place]? Or Actions Taken?: no/na

## 2018-04-05 NOTE — H&P (Addendum)
History and Physical    [name]:[id number] DOB: [birth year] DOA: 04/04/2018  PCP: Rory Percy, MD   Patient coming from: Home  Chief Complaint: Chest pain  HPI: Donna Howe is a 62 y.o. female with medical history significant for CAD with prior CABG and NSTEMI with stent placement, left lower extremity peripheral arterial disease, asthma, chronic anemia, hypertension, gout, type 2 diabetes, and dyslipidemia who presents with centralized chest pain with radiation to the back that occurred yesterday and spontaneously resolved only to occur once again today.  She appears to deny any particular aggravating or alleviating factors and states that the discomfort feels more like pressure and was associated with some diaphoresis and mild nausea.  She also has noted some radiation into her left upper extremity.  She states that this discomfort feels similar to her previous MI.  She is noted some mild swelling to her lower extremities as well.  She states that she usually takes nitroglycerin for relief, but has run out of this medication at home.  She claims that she has not always been compliant with her medications as she has suffered a lot of losses in her family over the last few months, but she tries to take her medications at least every other day. She denies any fever, chills, cough, palpitations, orthopnea, paroxysmal nocturnal dyspnea, abdominal pain, vomiting, or diarrhea.   ED Course: Vital signs are stable with some elevated blood pressure readings.  Laboratory data demonstrate stable creatinine of 1.97 and a mild leukocytosis of 11,700.  Troponin 0.05 and EKG with normal sinus rhythm with some mild ST/T wave changes in the lateral leads.  Chest pain is currently improved with nitroglycerin drip that has been initiated along with heparin drip.  Chest x-ray with no acute process and some mild cardiomegaly.  Review of Systems: All others reviewed and otherwise negative.  Past  Medical History:  Diagnosis Date  . Anemia   . Asthma   . CAD (coronary artery disease)    Multivessel s/p CABG, occluded SVG to OM, DES to LM/circ 12/2012  . Carotid artery disease ([place])   . Chronic kidney disease (CKD), stage III (moderate) (HCC)   . Essential hypertension   . Gout   . Hyperlipidemia   . Hypothyroidism   . Myocardial infarction ([place])   . PAD (peripheral artery disease) ([place])    Dr. Kellie Simmering  . SBO (small bowel obstruction) ([place]) 2011   Status post lysis of adhesions & hernia repair  . Thrombocytopenia, unspecified ([place])   . Type 2 diabetes mellitus ([place])   . Umbilical hernia     Past Surgical History:  Procedure Laterality Date  . CESAREAN SECTION  1984  . CHOLECYSTECTOMY  2010  . CORONARY ARTERY BYPASS GRAFT  2005  . CORONARY BALLOON ANGIOPLASTY N/A 05/31/2017   Procedure: CORONARY BALLOON ANGIOPLASTY;  Surgeon: Jettie Booze, MD;  Location: [facility];  Service: Cardiovascular;  Laterality: N/A;  . CORONARY STENT INTERVENTION N/A 05/31/2017   Procedure: CORONARY STENT INTERVENTION;  Surgeon: Jettie Booze, MD;  Location: [facility];  Service: Cardiovascular;  Laterality: N/A;  . ENDARTERECTOMY Right 04/18/2013   Procedure: ENDARTERECTOMY CAROTID;  Surgeon: Mal Misty, MD;  Location: [facility];  Service: Vascular;  Laterality: Right;  . HERNIA REPAIR  1989  . Incisional hernia repair x2  03/04/2010   Laparoscopic with 35cm mesh by Dr Ronnald Collum  . LEFT HEART CATH AND CORS/GRAFTS ANGIOGRAPHY N/A 05/31/2017   Procedure:  LEFT HEART CATH AND CORS/GRAFTS ANGIOGRAPHY;  Surgeon: Jettie Booze, MD;  Location: [place] [facility];  Service: Cardiovascular;  Laterality: N/A;  . LEFT HEART CATHETERIZATION WITH CORONARY ANGIOGRAM N/A 12/19/2012   Procedure: LEFT HEART CATHETERIZATION WITH CORONARY ANGIOGRAM;  Surgeon: Josue Hector, MD;  Location: [facility] CATH LAB;  Service: Cardiovascular;  Laterality: N/A;  . LEFT HEART CATHETERIZATION WITH  CORONARY/GRAFT ANGIOGRAM N/A 04/19/2013   Procedure: LEFT HEART CATHETERIZATION WITH Beatrix Fetters;  Surgeon: Lorretta Harp, MD;  [facility];  Service: Cardiovascular;  Laterality: N/A;  . PATCH ANGIOPLASTY Right 04/18/2013   Procedure: PATCH ANGIOPLASTY Right Internal Carotid Artery;  Surgeon: Mal Misty, MD;  Location: [place];  Service: Vascular;  Laterality: Right;  . PERCUTANEOUS CORONARY STENT INTERVENTION (PCI-S) Right 12/19/2012   Procedure: PERCUTANEOUS CORONARY STENT INTERVENTION (PCI-S);  Surgeon: Josue Hector, MD;  Location: [facility] CATH LAB;  Service: Cardiovascular;  Laterality: Right;  . SHOULDER SURGERY       reports that she quit smoking about 5 years ago. Her smoking use included cigarettes. She has a 20.00 pack-year smoking history. She has never used smokeless tobacco. She reports that she does not drink alcohol or use drugs.  Allergies  Allergen Reactions  . Penicillins Other (See Comments)    REACTION: Unknown, told as a child    Family History  Problem Relation Age of Onset  . Diabetes Mother   . Heart disease Mother        before age 67  . Hyperlipidemia Mother   . Hypertension Mother   . Thyroid disease Father   . Hypertension Father   . AAA (abdominal aortic aneurysm) Father   . Heart disease Brother        before age 29  . Hypertension Brother   . Hyperlipidemia Son   . Hypertension Son     Prior to Admission medications   Medication Sig Start Date End Date Taking? Authorizing Provider  ALPRAZolam Duanne Moron) 0.5 MG tablet Take 1/2 tab by mouth every morning & evening and 1 tab at bedtime    [provider]  aspirin EC 81 MG tablet Take 81 mg by mouth daily.    [provider]  atorvastatin (LIPITOR) 80 MG tablet Take 1 tablet (80 mg total) by mouth daily. 06/01/17   Johnson, Clanford L, MD  clopidogrel (PLAVIX) 75 MG tablet Take 1 tablet (75 mg total) by mouth daily. 12/20/12   Barrett, Evelene Croon, PA-C  colchicine 0.6 MG  tablet Take 0.6 mg by mouth daily.     [provider]  gabapentin (NEURONTIN) 400 MG capsule Take 400 mg by mouth at bedtime.     Rory Percy, MD  levothyroxine (SYNTHROID, LEVOTHROID) 125 MCG tablet Take 125 mcg by mouth every morning.    [provider]  lisinopril (PRINIVIL,ZESTRIL) 40 MG tablet Take 40 mg by mouth daily.    [provider]  nitroGLYCERIN (NITROSTAT) 0.4 MG SL tablet Place 0.4 mg under the tongue every 5 (five) minutes as needed for chest pain. Not to exceed 3 in 15 minute time frame    [provider]  NOVOLIN 70/30 RELION (70-30) 100 UNIT/ML injection Inject 20 Units into the skin 2 (two) times daily with a meal.  08/28/15   [provider]  ondansetron (ZOFRAN ODT) 8 MG disintegrating tablet Take 1 tablet (8 mg total) by mouth every 8 (eight) hours as needed for nausea or vomiting. 08/14/15   Evalee Jefferson, PA-C  Physical Exam: Vitals:   04/04/18 2144 04/04/18 2147  BP:  (!) 161/52  Pulse:  66  Resp:  18  Temp:  98 F (36.7 C)  TempSrc:  Oral  SpO2:  98%  Weight: 90.7 kg (200 lb)   Height: 5\' 5"  (1.651 m)     Constitutional: NAD, calm, comfortable Vitals:   04/04/18 2144 04/04/18 2147  BP:  (!) 161/52  Pulse:  66  Resp:  18  Temp:  98 F (36.7 C)  TempSrc:  Oral  SpO2:  98%  Weight: 90.7 kg (200 lb)   Height: 5\' 5"  (1.651 m)    Eyes: lids and conjunctivae normal ENMT: Mucous membranes are moist.  Neck: normal, supple Respiratory: clear to auscultation bilaterally. Normal respiratory effort. No accessory muscle use.  Cardiovascular: Regular rate and rhythm, no murmurs.  Trace bilateral edema. Abdomen: no tenderness, no distention. Bowel sounds positive.  Musculoskeletal:  No joint deformity upper and lower extremities.   Skin: no rashes, lesions, ulcers.  Psychiatric: Normal judgment and insight. Alert and oriented x 3. Normal mood.   Labs on Admission: I have personally reviewed following labs and  imaging studies  CBC: Recent Labs  Lab 04/04/18 2313  WBC 11.7*  HGB 13.6  HCT 40.5  MCV 90.0  PLT 606   Basic Metabolic Panel: Recent Labs  Lab 04/04/18 2313  NA 139  K 4.3  CL 106  CO2 27  GLUCOSE 137*  BUN 32*  CREATININE 1.97*  CALCIUM 9.0   GFR: Estimated Creatinine Clearance: 34.2 mL/min (A) (by C-G formula based on SCr of 1.97 mg/dL (H)). Liver Function Tests: No results for input(s): AST, ALT, ALKPHOS, BILITOT, PROT, ALBUMIN in the last 168 hours. No results for input(s): LIPASE, AMYLASE in the last 168 hours. No results for input(s): AMMONIA in the last 168 hours. Coagulation Profile: Recent Labs  Lab 04/04/18 2313  INR 0.88   Cardiac Enzymes: Recent Labs  Lab 04/04/18 2313  TROPONINI 0.05*   BNP (last 3 results) No results for input(s): PROBNP in the last 8760 hours. HbA1C: No results for input(s): HGBA1C in the last 72 hours. CBG: No results for input(s): GLUCAP in the last 168 hours. Lipid Profile: No results for input(s): CHOL, HDL, LDLCALC, TRIG, CHOLHDL, LDLDIRECT in the last 72 hours. Thyroid Function Tests: No results for input(s): TSH, T4TOTAL, FREET4, T3FREE, THYROIDAB in the last 72 hours. Anemia Panel: No results for input(s): VITAMINB12, FOLATE, FERRITIN, TIBC, IRON, RETICCTPCT in the last 72 hours. Urine analysis:    Component Value Date/Time   COLORURINE YELLOW 08/13/2015 1840   APPEARANCEUR HAZY (A) 08/13/2015 1840   LABSPEC 1.015 08/13/2015 1840   PHURINE 6.0 08/13/2015 1840   GLUCOSEU NEGATIVE 08/13/2015 1840   HGBUR NEGATIVE 08/13/2015 1840   BILIRUBINUR NEGATIVE 08/13/2015 1840   KETONESUR NEGATIVE 08/13/2015 1840   PROTEINUR 100 (A) 08/13/2015 1840   UROBILINOGEN 0.2 10/26/2013 2229   NITRITE NEGATIVE 08/13/2015 1840   LEUKOCYTESUR TRACE (A) 08/13/2015 1840    Radiological Exams on Admission: Dg Chest 2 View  Result Date: 04/04/2018 CLINICAL DATA:  Chest pain EXAM: CHEST - 2 VIEW COMPARISON:  05/30/2017 FINDINGS:  Mild cardiomegaly. Remote median sternotomy. Lungs are clear. No pleural effusion or pneumothorax. IMPRESSION: Mild cardiomegaly without active airspace disease. Electronically Signed   By: Ulyses Jarred M.D.   On: 04/04/2018 22:10    EKG: Independently reviewed. NSR 71bpm with lateral ST/T wave changes.  Assessment/Plan Principal Problem:   ACS (acute coronary syndrome) ([place])  Active Problems:   Hyperlipidemia   Obesity (BMI 30-39.9)   Essential hypertension   Gout   Chronic kidney disease (CKD), stage III (moderate) (HCC)   CAD (coronary artery disease)   Hx of CABG   Anemia   Diabetes ([place])   History of coronary artery disease    1. Chest pain concerning for unstable angina versus NSTEMI.  Patient has significant cardiac history noted with some poor compliance to her antiplatelet agents.  Her history is very concerning and will continue heparin drip as well as aspirin and Plavix and beta-blocker.  Continue nitroglycerin drip for chest discomfort with cardiology consult in a.m.  2D echocardiogram. Trend troponins. EKG in am. N.p.o. except medications for now. 2. CKD stage III.  Stable and at baseline. 3. Dyslipidemia.  Continue atorvastatin. 4. Hypothyroidism.  Continue levothyroxine. 5. Hypertension.  Hold ACE inhibitor due to renal dysfunction currently and continue nitroglycerin drip along with metoprolol which will be added. 6. Anemia.  Stable with no overt bleeding noted. 7. Type 2 diabetes.  Place on sliding scale insulin and hold home insulin for now.   DVT prophylaxis: Heparin drip Code Status: Full Family Communication: None at bedside Disposition Plan:Cardiology evaluation for ACS Consults called:Cardiology Admission status: Inpatient, SDU   Heyward Douthit D Manuella Ghazi DO [facility] Pager [phone]  If 7PM-7AM, please contact night-coverage [email or web address] Password [facility]  04/05/2018, 12:44 AM

## 2018-04-05 NOTE — Progress Notes (Signed)
Trop 0.08 called by lab at [place]. MD notified via text page.

## 2018-04-05 NOTE — Progress Notes (Addendum)
ANTICOAGULATION CONSULT NOTE - Follow Up Consult  Pharmacy Consult for heparin Indication: chest pain/ACS  Allergies  Allergen Reactions  . Penicillins Other (See Comments)    REACTION: Unknown, told as a child Has patient had a PCN reaction causing immediate rash, facial/tongue/throat swelling, SOB or lightheadedness with hypotension: Unknown Has patient had a PCN reaction causing severe rash involving mucus membranes or skin necrosis: Unknown Has patient had a PCN reaction that required hospitalization: Unknown Has patient had a PCN reaction occurring within the last 10 years: No If all of the above answers are "NO", then may proceed with Cephalosporin use.     Patient Measurements: Height: 5\' 5"  (165.1 cm) Weight: 204 lb 9.4 oz (92.8 kg) IBW/kg (Calculated) : 57 Heparin Dosing Weight: 77.1 kg  Vital Signs: Temp: 97.6 F (36.4 C) (07/19 0400) Temp Source: Oral (07/19 0400) BP: 173/51 (07/19 0949) Pulse Rate: 53 (07/19 0949)  Labs: Recent Labs    04/04/18 2313 04/05/18 0114 04/05/18 0647 04/05/18 0924  HGB 13.6  --   --   --   HCT 40.5  --   --   --   PLT 158  --   --   --   APTT 33  --   --   --   LABPROT 11.9  --   --   --   INR 0.88  --   --   --   HEPARINUNFRC  --   --   --  0.21*  CREATININE 1.97*  --   --   --   TROPONINI 0.05* 0.08* 0.20*  --     Estimated Creatinine Clearance: 34.6 mL/min (A) (by C-G formula based on SCr of 1.97 mg/dL (H)).   Medications:  Medications Prior to Admission  Medication Sig Dispense Refill Last Dose  . ALPRAZolam (XANAX) 0.5 MG tablet Take 1/2 tab by mouth every morning & evening and 1 tab at bedtime   Taking  . aspirin EC 81 MG tablet Take 81 mg by mouth daily.   Taking  . atorvastatin (LIPITOR) 80 MG tablet Take 1 tablet (80 mg total) by mouth daily. 30 tablet 0 Taking  . clopidogrel (PLAVIX) 75 MG tablet Take 1 tablet (75 mg total) by mouth daily. 30 tablet 11 Taking  . colchicine 0.6 MG tablet Take 0.6 mg by mouth  daily.    Taking  . gabapentin (NEURONTIN) 400 MG capsule Take 400 mg by mouth at bedtime.    Taking  . levothyroxine (SYNTHROID, LEVOTHROID) 125 MCG tablet Take 125 mcg by mouth every morning.   Taking  . lisinopril (PRINIVIL,ZESTRIL) 40 MG tablet Take 40 mg by mouth daily.   Taking  . nitroGLYCERIN (NITROSTAT) 0.4 MG SL tablet Place 0.4 mg under the tongue every 5 (five) minutes as needed for chest pain. Not to exceed 3 in 15 minute time frame   Taking  . NOVOLIN 70/30 RELION (70-30) 100 UNIT/ML injection Inject 20 Units into the skin 2 (two) times daily with a meal.    Taking  . ondansetron (ZOFRAN ODT) 8 MG disintegrating tablet Take 1 tablet (8 mg total) by mouth every 8 (eight) hours as needed for nausea or vomiting. 20 tablet 0 Taking    Assessment: 62 yo female c/o chest pain with diaphoresis, left arm pain and back pain.  Heparin level curretnly subtherapeutic at 0.21.  Goal of Therapy:  Heparin level 0.3-0.7 units/ml Monitor platelets by anticoagulation protocol: Yes   Plan:  Rebolus 1200 units heparin x 1  Increase heparin to 1100 units/hr Check anti-Xa level in 6 hours and daily while on heparin Continue to monitor H&H and platelets  Remo Lipps C Odell Choung 04/05/2018,10:35 AM

## 2018-04-05 NOTE — Consult Note (Addendum)
Cardiology Consultation:   Patient ID: Donna Howe; [id number]; 1956-09-02   Admit date: 04/04/2018 Date of Consult: 04/05/2018  Primary Care Provider: Rory Percy, MD Primary Cardiologist: Rozann Lesches, MD  Chief Complaint: chest pain  Patient Profile:   Donna Howe is a 62 y.o. female with a hx of CAD s/p CABG 2005 with numerous PCIs since then (last in 05/2017), CKD stage III, anemia, asthma, HTN, HLD, hypothyroidism, gout, PAD (prior R CEA 2014, was followed by [facility]), SBO s/p prior LOA, thrombocytopenia, DM, bradycardia (not on BB due to this) who is being seen today for the evaluation of chest pain/elevated troponin at the request of Dr. Clementeen Graham.  History of Present Illness:   She underwent bypass in 2005.  She's had multiple PCIs since then. In 01/2004 had PTCA to SVG-OM1. In 03/2004 she underwent PTCA/DES to LM and prox LCx, PTCA/CBA LAD, PTCA/CBA OM1. Later that month she also had DES to SVG-OM1. In 01/2011 she underwent PTCA/DES to SVG-OM. In 2015 she underwent PTCA/DES to LM and LCx. Most recent cath was in 05/2017 when he was admitted with chest discomfort and mildly elevated tropnin and underwent cutting balloon angioplasty to prox Cx and DES to SVG-RCA (cath report below). 2D echo that admission showed mild LVH, EF 60-65%, no RWMA.  She has been under considerable stress this year and has not been always compliant with her meds but tries to at least take them every other day. In March her boyfriend of 46 years died, and shortly after that, her dog was killed. In 2023-03-29 her mother passed away. On 04-23-2023 she developed intermittent substernal chest discomfort she initially hoped was indigestion. It waxed and waned without specific pattern then yesterday while helping someone make a bed it acutely worsened and she got sweaty, so came to the ER. Troponins 0.05-0.08-0.20, WBC 11.7, BUN 32/Cr 1.97 (previously 1.59 in 05/2017, appears variable from 1.59-1.8 last year). HR  50s-60s, BP labile from 118/63 to 197/82. She was started on IV heparin and IV NTG. IV NTG appears to have been stopped this AM (nurse off the floor so have not yet clarified) -? R/t BP shift overnight. She is overall feeling better but still has a 3/10 "awareness" of some chest pressure. This is c/w her prior angina per her report.  Past Medical History:  Diagnosis Date  . Anemia   . Asthma   . CAD (coronary artery disease)    Multivessel s/p CABG, occluded SVG to OM, DES to LM/circ 12/2012  . Carotid artery disease ([place])   . Chronic kidney disease (CKD), stage III (moderate) (H[facility])   . Essential hypertension   . Gout   . Hyperlipidemia   . Hypothyroidism   . Myocardial infarction ([place])   . PAD (peripheral artery disease) ([place])    Dr. Kellie Simmering  . SBO (small bowel obstruction) ([place]) 2011   Status post lysis of adhesions & hernia repair  . Thrombocytopenia, unspecified ([place])   . Type 2 diabetes mellitus ([place])   . Umbilical hernia     Past Surgical History:  Procedure Laterality Date  . CESAREAN SECTION  1984  . CHOLECYSTECTOMY  2010  . CORONARY ARTERY BYPASS GRAFT  2005  . CORONARY BALLOON ANGIOPLASTY N/A 05/31/2017   Procedure: CORONARY BALLOON ANGIOPLASTY;  Surgeon: Jettie Booze, MD;  Location: [place] [facility];  Service: Cardiovascular;  Laterality: N/A;  . CORONARY STENT INTERVENTION N/A 05/31/2017   Procedure: CORONARY STENT INTERVENTION;  Surgeon: Jettie Booze, MD;  Location: [place] [facility];  Service: Cardiovascular;  Laterality: N/A;  . ENDARTERECTOMY Right 04/18/2013   Procedure: ENDARTERECTOMY CAROTID;  Surgeon: Mal Misty, MD;  Location: [place];  Service: Vascular;  Laterality: Right;  . HERNIA REPAIR  1989  . Incisional hernia repair x2  03/04/2010   Laparoscopic with 35cm mesh by Dr Ronnald Collum  . LEFT HEART CATH AND CORS/GRAFTS ANGIOGRAPHY N/A 05/31/2017   Procedure: LEFT HEART CATH AND CORS/GRAFTS ANGIOGRAPHY;  Surgeon: Jettie Booze, MD;   Location: [place] [facility];  Service: Cardiovascular;  Laterality: N/A;  . LEFT HEART CATHETERIZATION WITH CORONARY ANGIOGRAM N/A 12/19/2012   Procedure: LEFT HEART CATHETERIZATION WITH CORONARY ANGIOGRAM;  Surgeon: Josue Hector, MD;  Location: [facility] CATH LAB;  Service: Cardiovascular;  Laterality: N/A;  . LEFT HEART CATHETERIZATION WITH CORONARY/GRAFT ANGIOGRAM N/A 04/19/2013   Procedure: LEFT HEART CATHETERIZATION WITH Beatrix Fetters;  Surgeon: Lorretta Harp, MD;  Location: [facility] C[facility];  Service: Cardiovascular;  Laterality: N/A;  . PATCH ANGIOPLASTY Right 04/18/2013   Procedure: PATCH ANGIOPLASTY Right Internal Carotid Artery;  Surgeon: Mal Misty, MD;  Location: [place];  Service: Vascular;  Laterality: Right;  . PERCUTANEOUS CORONARY STENT INTERVENTION (PCI-S) Right 12/19/2012   Procedure: PERCUTANEOUS CORONARY STENT INTERVENTION (PCI-S);  Surgeon: Josue Hector, MD;  Location: [facility] CATH LAB;  Service: Cardiovascular;  Laterality: Right;  . SHOULDER SURGERY       Inpatient Medications: Scheduled Meds: . aspirin EC  81 mg Oral Daily  . atorvastatin  80 mg Oral Daily  . clopidogrel  75 mg Oral Daily  . gabapentin  400 mg Oral QHS  . insulin aspart  0-5 Units Subcutaneous QHS  . insulin aspart  0-9 Units Subcutaneous Q4H  . levothyroxine  125 mcg Oral BH-q7a  . metoprolol tartrate  25 mg Oral BID  . sodium chloride flush  3 mL Intravenous Q12H   Continuous Infusions: . sodium chloride    . heparin 950 Units/hr (04/05/18 0200)  . nitroGLYCERIN Stopped (04/05/18 0827)   PRN Meds: sodium chloride, acetaminophen **OR** acetaminophen, metoprolol tartrate, morphine injection, ondansetron **OR** ondansetron (ZOFRAN) IV, sodium chloride flush  Home Meds: Prior to Admission medications   Medication Sig Start Date End Date Taking? Authorizing Provider  ALPRAZolam Duanne Moron) 0.5 MG tablet Take 1/2 tab by mouth every morning & evening and 1 tab at bedtime    [provider]    aspirin EC 81 MG tablet Take 81 mg by mouth daily.    [provider]  atorvastatin (LIPITOR) 80 MG tablet Take 1 tablet (80 mg total) by mouth daily. 06/01/17   Johnson, Clanford L, MD  clopidogrel (PLAVIX) 75 MG tablet Take 1 tablet (75 mg total) by mouth daily. 12/20/12   Barrett, Evelene Croon, PA-C  colchicine 0.6 MG tablet Take 0.6 mg by mouth daily.     [provider]  gabapentin (NEURONTIN) 400 MG capsule Take 400 mg by mouth at bedtime.     Rory Percy, MD  levothyroxine (SYNTHROID, LEVOTHROID) 125 MCG tablet Take 125 mcg by mouth every morning.    [provider]  lisinopril (PRINIVIL,ZESTRIL) 40 MG tablet Take 40 mg by mouth daily.    [provider]  nitroGLYCERIN (NITROSTAT) 0.4 MG SL tablet Place 0.4 mg under the tongue every 5 (five) minutes as needed for chest pain. Not to exceed 3 in 15 minute time frame    [provider]  NOVOLIN 70/30 RELION (70-30) 100 UNIT/ML injection Inject  20 Units into the skin 2 (two) times daily with a meal.  08/28/15   [provider]  ondansetron (ZOFRAN ODT) 8 MG disintegrating tablet Take 1 tablet (8 mg total) by mouth every 8 (eight) hours as needed for nausea or vomiting. 08/14/15   Evalee Jefferson, PA-C    Allergies:    Allergies  Allergen Reactions  . Penicillins Other (See Comments)    REACTION: Unknown, told as a child    Social History:   Social History   Socioeconomic History  . Marital status: Divorced    Spouse name: Not on file  . Number of children: Not on file  . Years of education: Not on file  . Highest education level: Not on file  Occupational History  . Occupation: Disabled  Social Needs  . Financial resource strain: Not on file  . Food insecurity:    Worry: Not on file    Inability: Not on file  . Transportation needs:    Medical: Not on file    Non-medical: Not on file  Tobacco Use  . Smoking status: Former Smoker    Packs/day: 1.00    Years: 20.00    Pack  years: 20.00    Types: Cigarettes    Last attempt to quit: 12/10/2012    Years since quitting: 5.3  . Smokeless tobacco: Never Used  Substance and Sexual Activity  . Alcohol use: No    Alcohol/week: 0.0 oz  . Drug use: No  . Sexual activity: Not Currently  Lifestyle  . Physical activity:    Days per week: Not on file    Minutes per session: Not on file  . Stress: Not on file  Relationships  . Social connections:    Talks on phone: Not on file    Gets together: Not on file    Attends religious service: Not on file    Active member of club or organization: Not on file    Attends meetings of clubs or organizations: Not on file    Relationship status: Not on file  . Intimate partner violence:    Fear of current or ex partner: Not on file    Emotionally abused: Not on file    Physically abused: Not on file    Forced sexual activity: Not on file  Other Topics Concern  . Not on file  Social History Narrative   Lives with mother.    Family History:   The patient's family history includes AAA (abdominal aortic aneurysm) in her father; Diabetes in her mother; Heart disease in her brother and mother; Hyperlipidemia in her mother and son; Hypertension in her brother, father, mother, and son; Thyroid disease in her father.  ROS:  Please see the history of present illness.  All other ROS reviewed and negative.     Physical Exam/Data:   Vitals:   04/05/18 0615 04/05/18 0630 04/05/18 0700 04/05/18 0800  BP: 137/62 118/63 (!) 144/58 (!) 170/71  Pulse: (!) 53 (!) 50 (!) 51 (!) 53  Resp: 19 19 17 20   Temp:      TempSrc:      SpO2: 94% 95% 96% 96%  Weight:      Height:        Intake/Output Summary (Last 24 hours) at 04/05/2018 0845 Last data filed at 04/05/2018 0600 Gross per 24 hour  Intake 75.39 ml  Output 1 ml  Net 74.39 ml   Filed Weights   04/04/18 2144 04/05/18 0100 04/05/18 0500  Weight:  200 lb (90.7 kg) 204 lb 9.4 oz (92.8 kg) 204 lb 9.4 oz (92.8 kg)   Body mass  index is 34.05 kg/m.  General: Well developed, well nourished WF, in no acute distress. Head: Normocephalic, atraumatic, sclera non-icteric, no xanthomas, nares are without discharge.  Neck: Negative for carotid bruits. JVD not elevated. Prior R CEA scar. Lungs: Clear bilaterally to auscultation without wheezes, rales, or rhonchi. Breathing is unlabored. Heart: RRR with S1 S2. No murmurs, rubs, or gallops appreciated. Abdomen: Soft, non-tender, non-distended with normoactive bowel sounds. No hepatomegaly. No rebound/guarding. No obvious abdominal masses. Msk:  Strength and tone appear normal for age. Extremities: No clubbing or cyanosis. No edema.  Distal pedal pulses are 2+ and equal bilaterally. Neuro: Alert and oriented X 3. No facial asymmetry. No focal deficit. Moves all extremities spontaneously.  Psych:  Responds to questions appropriately with a normal affect. Appropriately tearful when discussing personal losses  EKG:  The EKG was personally reviewed and demonstrates NSR 71bpm ST depression I, II, V5-V6, TWI I, avL, V5-V6 - appears similar to 05/2017  Relevant CV Studies: Cath 05/2017   Stent from left main to circumflex with restenosis, up to 75% in the distal portion.  Ost LAD to Prox LAD lesion, 80 %stenosed. LIMA to LAD is patent.  Ost 1st Mrg lesion, 80 %stenosed at ostium. 1st Mrg-1 lesion, 70 %stenosed in mid vessel. Unchanged from prior. Patent stent in mid OM1.  SVG to OM is occluded.  LV end diastolic pressure is normal.  There is no aortic valve stenosis.  Prox Cx lesion, 75 %stenosed, treated with 3.0 cutting balloon.  Post intervention, there is a 15% residual stenosis.  Severe disease of the proximal native RCA.  Origin lesion of SVG to RCA with catheter dampening, 80 %stenosed. A STENT RESOLUTE ONYX 3.5X12 drug eluting stent was successfully placed.  Post intervention, there is a 0% residual stenosis.  Laboratory Data:  Chemistry Recent Labs  Lab  04/04/18 2313  NA 139  K 4.3  CL 106  CO2 27  GLUCOSE 137*  BUN 32*  CREATININE 1.97*  CALCIUM 9.0  GFRNONAA 27*  GFRAA 31*  ANIONGAP 6    No results for input(s): PROT, ALBUMIN, AST, ALT, ALKPHOS, BILITOT in the last 168 hours. Hematology Recent Labs  Lab 04/04/18 2313  WBC 11.7*  RBC 4.50  HGB 13.6  HCT 40.5  MCV 90.0  MCH 30.2  MCHC 33.6  RDW 13.1  PLT 158   Cardiac Enzymes Recent Labs  Lab 04/04/18 2313 04/05/18 0114 04/05/18 0647  TROPONINI 0.05* 0.08* 0.20*   No results for input(s): TROPIPOC in the last 168 hours.  BNPNo results for input(s): BNP, PROBNP in the last 168 hours.  DDimer No results for input(s): DDIMER in the last 168 hours.  Radiology/Studies:  Dg Chest 2 View  Result Date: 04/04/2018 CLINICAL DATA:  Chest pain EXAM: CHEST - 2 VIEW COMPARISON:  05/30/2017 FINDINGS: Mild cardiomegaly. Remote median sternotomy. Lungs are clear. No pleural effusion or pneumothorax. IMPRESSION: Mild cardiomegaly without active airspace disease. Electronically Signed   By: Ulyses Jarred M.D.   On: 04/04/2018 22:10    Assessment and Plan:   1. Chest pain worrisome for NSTEMI with history of CAD (in setting of spotty compliance with antiplatelets and other home meds) - on very little otherwise by way of antianginals. It looks like isosorbide was discontinued after last PCI for unclear reasons (possibly relief of CP with PCI). No adverse reaction seen. Currently has IV  NTG ordered but not receiving. Continue ASA, Plavix, statin. Not on BB due to baseline bradycardia. I considered P2Y12 testing but this is unlikely to be accurate given intermittent noncompliance with Plavix. I suspect she needs repeat cath but will review timing with MD - still with some ongoing low grade chest discomfort but overall feeling comfortable. Risks and benefits of cardiac catheterization have been discussed with the patient. These include bleeding, infection, kidney damage, stroke, heart  attack, death. The patient understands these risks and is willing to proceed if this is recommended. She does report some increased swelling of RLE but I do not appreciate this whatsoever. If plan for cath, likely needs some IV fluid hydration given her CKD. Dr. Harl Bowie will see.  2. Labile HTN - ACEI on hold for higher than prior Cr, can consider resuming after procedures are complete. Will review regimen with MD in context of the above.  3. Hyperlipidemia - resume home statin and encourage compliance.  4. Diabetes mellitus - per IM. Will need OP f/u PCP for this as well.   5. CKD stage III - Cr slightly higher than where it was at last DC in 05/2017 but similar to the range seen during that admission. ACEI on hold for now.  For questions or updates, please contact [place] Please consult [email or web address] for contact info under Cardiology/STEMI.    Signed, Charlie Pitter, PA-C  04/05/2018 8:45 AM  Attending note Patient seen and disucssed with PA Dunn, I agree with her documentation. She has extensive CAD history as reported above including CABG and prior stenting. Presents with chest pain.    K 4.3, Cr 1.97 (up from 1.6), WBC 11.7, Hgb 13.6, Plt 158,  Trop 0.05-->0.08--> 0.20 CXR no acute process EKG SR, chronic lateral ST/T changes 05/2017 echo: LVE 60-65%, no WMAs  Patient presents with NSTEMI. Fairly mild trop so far, very similar pattern to her presentation 05/2017 when trop peaked around 0.2, at that time she received PTCA to prox LCX a DES to SVG-RCA. She will require cath this admission, limiting step at this time is her renal function. I would plan for gentle IVFs over the weekend and cath Monday, if progressing or worsening symptoms then would require cath over weekend. Would recommend that we transfer to [facility] for management of the weekend  Medical therapy with ASA 81, atorva 80, plavix 75, hep gtt, lopressor 25mg  bid. ACE-I on hold due to renal function. Transiently on NG drip  now off. Brady on lopressor, lower to 12.5mg  bid. While off lisinopril with high bp's, will start norvasc 10mg  daily for now since off NG drip.   Will write for NS at 75/hr for 1.5 liters. Need for ongoing IVFs will need to be reassessed tomorrow.    Carlyle Dolly MD

## 2018-04-06 DIAGNOSIS — I249 Acute ischemic heart disease, unspecified: Secondary | ICD-10-CM

## 2018-04-06 LAB — GLUCOSE, CAPILLARY
GLUCOSE-CAPILLARY: 135 mg/dL — AB (ref 70–99)
GLUCOSE-CAPILLARY: 199 mg/dL — AB (ref 70–99)
Glucose-Capillary: 146 mg/dL — ABNORMAL HIGH (ref 70–99)
Glucose-Capillary: 192 mg/dL — ABNORMAL HIGH (ref 70–99)
Glucose-Capillary: 135 mg/dL — ABNORMAL HIGH (ref 70–99)
Glucose-Capillary: 118 mg/dL — ABNORMAL HIGH (ref 70–99)

## 2018-04-06 LAB — HEPARIN LEVEL (UNFRACTIONATED)
HEPARIN UNFRACTIONATED: 0.23 [IU]/mL — AB (ref 0.30–0.70)
Heparin Unfractionated: 0.24 IU/mL — ABNORMAL LOW (ref 0.30–0.70)

## 2018-04-06 MED ORDER — COLCHICINE 0.6 MG PO TABS
0.6000 mg | ORAL_TABLET | Freq: Every day | ORAL | Status: DC
  Administered 2018-04-06 – 2018-04-10 (×5): 0.6 mg via ORAL
  Filled 2018-04-06 (×5): qty 1

## 2018-04-06 MED ORDER — INSULIN ASPART 100 UNIT/ML ~~LOC~~ SOLN
0.0000 [IU] | Freq: Three times a day (TID) | SUBCUTANEOUS | Status: DC
  Administered 2018-04-06: 1 [IU] via SUBCUTANEOUS
  Administered 2018-04-06: 2 [IU] via SUBCUTANEOUS
  Administered 2018-04-06: 1 [IU] via SUBCUTANEOUS
  Administered 2018-04-07: 2 [IU] via SUBCUTANEOUS
  Administered 2018-04-07 (×2): 1 [IU] via SUBCUTANEOUS
  Administered 2018-04-08: 2 [IU] via SUBCUTANEOUS
  Administered 2018-04-09 – 2018-04-10 (×3): 1 [IU] via SUBCUTANEOUS

## 2018-04-06 MED ORDER — LEVOTHYROXINE SODIUM 25 MCG PO TABS
125.0000 ug | ORAL_TABLET | ORAL | Status: DC
  Administered 2018-04-06 – 2018-04-10 (×5): 125 ug via ORAL
  Filled 2018-04-06 (×5): qty 1

## 2018-04-06 NOTE — Progress Notes (Signed)
Progress Note  Patient Name: Donna Howe Date of Encounter: 04/06/2018  Primary Cardiologist: Rozann Lesches, MD   Subjective   No acute events overnight. Urinating frequently but otherwise tolerating IV hydration prior to cath. No chest pain or shortness of breath.  Inpatient Medications    Scheduled Meds: . amLODipine  10 mg Oral Daily  . aspirin EC  81 mg Oral Daily  . atorvastatin  80 mg Oral Daily  . clopidogrel  75 mg Oral Daily  . colchicine  0.6 mg Oral Daily  . gabapentin  400 mg Oral QHS  . insulin aspart  0-5 Units Subcutaneous QHS  . insulin aspart  0-9 Units Subcutaneous TID WC  . levothyroxine  125 mcg Oral BH-q7a  . metoprolol tartrate  25 mg Oral BID  . sodium chloride flush  3 mL Intravenous Q12H   Continuous Infusions: . sodium chloride    . heparin 1,250 Units/hr (04/06/18 1216)  . nitroGLYCERIN Stopped (04/05/18 0827)   PRN Meds: sodium chloride, acetaminophen **OR** acetaminophen, metoprolol tartrate, morphine injection, ondansetron **OR** ondansetron (ZOFRAN) IV, sodium chloride flush   Vital Signs    Vitals:   04/06/18 0545 04/06/18 0546 04/06/18 0818 04/06/18 1214  BP: (!) 134/53  133/61 (!) 172/55  Pulse: (!) 46  (!) 54 (!) 42  Resp: (!) 21     Temp: 98.2 F (36.8 C)  98.2 F (36.8 C) 98.8 F (37.1 C)  TempSrc: Oral  Oral Oral  SpO2: 96%  100% 99%  Weight:  206 lb 9.6 oz (93.7 kg)    Height:        Intake/Output Summary (Last 24 hours) at 04/06/2018 1253 Last data filed at 04/06/2018 0700 Gross per 24 hour  Intake 1970.4 ml  Output 1100 ml  Net 870.4 ml   Filed Weights   04/05/18 0100 04/05/18 0500 04/06/18 0546  Weight: 204 lb 9.4 oz (92.8 kg) 204 lb 9.4 oz (92.8 kg) 206 lb 9.6 oz (93.7 kg)    Telemetry    NSR - Personally Reviewed  ECG    NSR - Personally Reviewed  Physical Exam   GEN: No acute distress.   Neck: supple, no JVD Cardiac: regular S1 and S2, no murmurs, rubs, or gallops.  Respiratory: Clear to  auscultation bilaterally. GI: Soft, nontender, non-distended. Bowel sounds normal MS: No edema; No deformity. Neuro:  Nonfocal, moves all limbs independently Psych: Normal affect   Labs    Chemistry Recent Labs  Lab 04/04/18 2313  NA 139  K 4.3  CL 106  CO2 27  GLUCOSE 137*  BUN 32*  CREATININE 1.97*  CALCIUM 9.0  GFRNONAA 27*  GFRAA 31*  ANIONGAP 6     Hematology Recent Labs  Lab 04/04/18 2313  WBC 11.7*  RBC 4.50  HGB 13.6  HCT 40.5  MCV 90.0  MCH 30.2  MCHC 33.6  RDW 13.1  PLT 158    Cardiac Enzymes Recent Labs  Lab 04/04/18 2313 04/05/18 0114 04/05/18 0647 04/05/18 1305  TROPONINI 0.05* 0.08* 0.20* 0.23*   No results for input(s): TROPIPOC in the last 168 hours.   BNPNo results for input(s): BNP, PROBNP in the last 168 hours.   DDimer No results for input(s): DDIMER in the last 168 hours.   Radiology    Dg Chest 2 View  Result Date: 04/04/2018 CLINICAL DATA:  Chest pain EXAM: CHEST - 2 VIEW COMPARISON:  05/30/2017 FINDINGS: Mild cardiomegaly. Remote median sternotomy. Lungs are clear. No pleural effusion or  pneumothorax. IMPRESSION: Mild cardiomegaly without active airspace disease. Electronically Signed   By: Ulyses Jarred M.D.   On: 04/04/2018 22:10    Cardiac Studies   Echo 04/05/18 Left ventricle: The cavity size was normal. Wall thickness was   normal. Systolic function was normal. The estimated ejection   fraction was in the range of 60% to 65%. Wall motion was normal;   there were no regional wall motion abnormalities. Features are   consistent with a pseudonormal left ventricular filling pattern,   with concomitant abnormal relaxation and increased filling   pressure (grade 2 diastolic dysfunction). Doppler parameters are   consistent with high ventricular filling pressure. - Aortic valve: Valve area (VTI): 1.56 cm^2. Valve area (Vmax):   1.63 cm^2. - Mitral valve: There was mild regurgitation. - Left atrium: The atrium was mildly  dilated. - Technically adequate study.   Patient Profile     62 y.o. female with a hx of CAD s/p CABG 2005 with numerous PCIs since then (last in 05/2017), CKD stage III, anemia, asthma, HTN, HLD, hypothyroidism, gout, PAD (prior R CEA 2014, was followed by [facility]), SBO s/p prior LOA, thrombocytopenia, DM, bradycardia (not on BB due to this) who was seen for evaluation of chest pain/elevated troponin, transferred to [facility] for planned cath on Monday.  Assessment & Plan    1. Chest pain, NSTEMI with history of CAD -troponins elevated, on heparin drip for NSTEMI -continue aspirin and clopidogrel -continue atorvastatin 80 mg -no current chest pain.  -has baseline bradycardia, monitoring response to metoprolol -given her CKD, will hydrate her prior to planned cath on Monday. EF is normal but has diastolic dysfunction.  2. Labile HTN - currently acceptable -ACEI on hold for higher than prior Cr, can consider resuming after procedures are complete.   3. Hyperlipidemia - resume home statin and encourage compliance.  4. CKD stage III - Cr slightly higher than where it was at last DC in 05/2017 but similar to the range seen during that admission. ACEI on hold for now. IV hydration prior to cath.  Risks and benefits of cardiac catheterization have been discussed with the patient.  These include bleeding, infection, kidney damage, stroke, heart attack, death.  The patient understands these risks and is willing to proceed.   Time Spent Directly with Patient: I have spent a total of >35 minutes with the patient reviewing hospital notes, telemetry, EKGs, labs and examining the patient as well as establishing an assessment and plan that was discussed personally with the patient.  > 50% of time was spent in direct patient care.  Length of Stay:  LOS: 1 day   Buford Dresser, MD, PhD [facility]  [facility]   04/06/2018, 12:53 PM   For questions or updates, please contact [place] Please consult [email or web address] for contact info under Cardiology/STEMI.

## 2018-04-06 NOTE — Progress Notes (Signed)
For most of night shift patient's heart rate has been in the 40's to low 50's.  Patient asymptomatic.

## 2018-04-06 NOTE — Progress Notes (Signed)
ANTICOAGULATION CONSULT NOTE - Follow Up Consult  Pharmacy Consult for heparin Indication: chest pain/ACS  Allergies  Allergen Reactions  . Penicillins Other (See Comments)    REACTION: Unknown, told as a child Has patient had a PCN reaction causing immediate rash, facial/tongue/throat swelling, SOB or lightheadedness with hypotension: Unknown Has patient had a PCN reaction causing severe rash involving mucus membranes or skin necrosis: Unknown Has patient had a PCN reaction that required hospitalization: Unknown Has patient had a PCN reaction occurring within the last 10 years: No If all of the above answers are "NO", then may proceed with Cephalosporin use.     Patient Measurements: Height: 5\' 5"  (165.1 cm) Weight: 206 lb 9.6 oz (93.7 kg) IBW/kg (Calculated) : 57 Heparin Dosing Weight: 77.1 kg  Vital Signs: Temp: 98.2 F (36.8 C) (07/20 0818) Temp Source: Oral (07/20 0818) BP: 133/61 (07/20 0818) Pulse Rate: 54 (07/20 0818)  Labs: Recent Labs    04/04/18 2313 04/05/18 0114 04/05/18 0647 04/05/18 0924 04/05/18 1305 04/05/18 1641 04/06/18 1042  HGB 13.6  --   --   --   --   --   --   HCT 40.5  --   --   --   --   --   --   PLT 158  --   --   --   --   --   --   APTT 33  --   --   --   --   --   --   LABPROT 11.9  --   --   --   --   --   --   INR 0.88  --   --   --   --   --   --   HEPARINUNFRC  --   --   --  0.21*  --  0.28* 0.24*  CREATININE 1.97*  --   --   --   --   --   --   TROPONINI 0.05* 0.08* 0.20*  --  0.23*  --   --     Estimated Creatinine Clearance: 34.8 mL/min (A) (by C-G formula based on SCr of 1.97 mg/dL (H)).   Medications:  Medications Prior to Admission  Medication Sig Dispense Refill Last Dose  . ALPRAZolam (XANAX) 0.5 MG tablet Take 1/2 tab by mouth every morning & evening and 1 tab at bedtime   02/16/2018  . aspirin EC 81 MG tablet Take 81 mg by mouth daily.   04/03/2018 at Unknown time  . atorvastatin (LIPITOR) 80 MG tablet Take 1 tablet  (80 mg total) by mouth daily. 30 tablet 0 04/02/2018  . clopidogrel (PLAVIX) 75 MG tablet Take 1 tablet (75 mg total) by mouth daily. 30 tablet 11 04/03/2018 at 0900  . colchicine 0.6 MG tablet Take 0.6 mg by mouth daily.    04/03/2018  . gabapentin (NEURONTIN) 400 MG capsule Take 400 mg by mouth at bedtime.    04/02/2018  . levothyroxine (SYNTHROID, LEVOTHROID) 125 MCG tablet Take 125 mcg by mouth every morning.   04/05/2018 at Unknown time  . nitroGLYCERIN (NITROSTAT) 0.4 MG SL tablet Place 0.4 mg under the tongue every 5 (five) minutes as needed for chest pain. Not to exceed 3 in 15 minute time frame   04/03/2018  . NOVOLIN 70/30 RELION (70-30) 100 UNIT/ML injection Inject 20 Units into the skin 2 (two) times daily with a meal.    04/03/2018  . ondansetron (ZOFRAN ODT) 8 MG disintegrating  tablet Take 1 tablet (8 mg total) by mouth every 8 (eight) hours as needed for nausea or vomiting. 20 tablet 0 unknown  . lisinopril (PRINIVIL,ZESTRIL) 40 MG tablet Take 40 mg by mouth daily.   04/03/2018    Assessment: 62 yo female c/o chest pain with diaphoresis, left arm pain and back pain transferred to [facility] from [place] via [facility]. Pharmacy consulted to manage heparin.  Heparin level continues to be subtherapeutic at 0.24. No s/sx of bleeding, CBC stable.  Goal of Therapy:  Heparin level 0.3-0.7 units/ml Monitor platelets by anticoagulation protocol: Yes   Plan:  Increase heparin to 1250 units/hr Check anti-Xa level in 6 hours and daily while on heparin Continue to monitor H&H and platelets  Thank you for involving pharmacy in this patient's care.  Janae Bridgeman, PharmD PGY1 Pharmacy Resident Phone: [phone] 04/06/2018 11:55 AM

## 2018-04-06 NOTE — Progress Notes (Signed)
ANTICOAGULATION CONSULT NOTE - Follow Up Consult  Pharmacy Consult for heparin Indication: chest pain/ACS  Allergies  Allergen Reactions  . Penicillins Other (See Comments)    REACTION: Unknown, told as a child Has patient had a PCN reaction causing immediate rash, facial/tongue/throat swelling, SOB or lightheadedness with hypotension: Unknown Has patient had a PCN reaction causing severe rash involving mucus membranes or skin necrosis: Unknown Has patient had a PCN reaction that required hospitalization: Unknown Has patient had a PCN reaction occurring within the last 10 years: No If all of the above answers are "NO", then may proceed with Cephalosporin use.     Patient Measurements: Height: 5\' 5"  (165.1 cm) Weight: 206 lb 9.6 oz (93.7 kg) IBW/kg (Calculated) : 57 Heparin Dosing Weight: 77.1 kg  Vital Signs: Temp: 99 F (37.2 C) (07/20 1635) Temp Source: Oral (07/20 1635) BP: 130/49 (07/20 1635) Pulse Rate: 53 (07/20 1635)  Labs: Recent Labs    04/04/18 2313 04/05/18 0114 04/05/18 0647  04/05/18 1305 04/05/18 1641 04/06/18 1042 04/06/18 1900  HGB 13.6  --   --   --   --   --   --   --   HCT 40.5  --   --   --   --   --   --   --   PLT 158  --   --   --   --   --   --   --   APTT 33  --   --   --   --   --   --   --   LABPROT 11.9  --   --   --   --   --   --   --   INR 0.88  --   --   --   --   --   --   --   HEPARINUNFRC  --   --   --    < >  --  0.28* 0.24* 0.23*  CREATININE 1.97*  --   --   --   --   --   --   --   TROPONINI 0.05* 0.08* 0.20*  --  0.23*  --   --   --    < > = values in this interval not displayed.    Estimated Creatinine Clearance: 34.8 mL/min (A) (by C-G formula based on SCr of 1.97 mg/dL (H)).   Medications:  Medications Prior to Admission  Medication Sig Dispense Refill Last Dose  . ALPRAZolam (XANAX) 0.5 MG tablet Take 1/2 tab by mouth every morning & evening and 1 tab at bedtime   02/16/2018  . aspirin EC 81 MG tablet Take 81 mg by  mouth daily.   04/03/2018 at Unknown time  . atorvastatin (LIPITOR) 80 MG tablet Take 1 tablet (80 mg total) by mouth daily. 30 tablet 0 04/02/2018  . clopidogrel (PLAVIX) 75 MG tablet Take 1 tablet (75 mg total) by mouth daily. 30 tablet 11 04/03/2018 at 0900  . colchicine 0.6 MG tablet Take 0.6 mg by mouth daily.    04/03/2018  . gabapentin (NEURONTIN) 400 MG capsule Take 400 mg by mouth at bedtime.    04/02/2018  . levothyroxine (SYNTHROID, LEVOTHROID) 125 MCG tablet Take 125 mcg by mouth every morning.   04/05/2018 at Unknown time  . nitroGLYCERIN (NITROSTAT) 0.4 MG SL tablet Place 0.4 mg under the tongue every 5 (five) minutes as needed for chest pain. Not to exceed 3 in 15  minute time frame   04/03/2018  . NOVOLIN 70/30 RELION (70-30) 100 UNIT/ML injection Inject 20 Units into the skin 2 (two) times daily with a meal.    04/03/2018  . ondansetron (ZOFRAN ODT) 8 MG disintegrating tablet Take 1 tablet (8 mg total) by mouth every 8 (eight) hours as needed for nausea or vomiting. 20 tablet 0 unknown  . lisinopril (PRINIVIL,ZESTRIL) 40 MG tablet Take 40 mg by mouth daily.   04/03/2018    Assessment: 62 yo female c/o chest pain with diaphoresis, left arm pain and back pain transferred to [facility] from [place] via [facility]. Pharmacy consulted to manage heparin.  Heparin level continues to be subtherapeutic at 0.23 after rate increase. No s/sx of bleeding or issues with the infusion per discussion with RN, CBC stable.  Goal of Therapy:  Heparin level 0.3-0.7 units/ml Monitor platelets by anticoagulation protocol: Yes   Plan:  Increase heparin to 1400 units/hr 6h heparin level Monitor daily heparin level and CBC, s/sx bleeding Cath for 7/22  Elicia Lamp, PharmD, BCPS Clinical Pharmacist Please check AMION for all [place] contact numbers 04/06/2018 7:46 PM

## 2018-04-07 DIAGNOSIS — Z951 Presence of aortocoronary bypass graft: Secondary | ICD-10-CM

## 2018-04-07 DIAGNOSIS — E782 Mixed hyperlipidemia: Secondary | ICD-10-CM

## 2018-04-07 DIAGNOSIS — E669 Obesity, unspecified: Secondary | ICD-10-CM

## 2018-04-07 DIAGNOSIS — I2510 Atherosclerotic heart disease of native coronary artery without angina pectoris: Secondary | ICD-10-CM

## 2018-04-07 LAB — BASIC METABOLIC PANEL
ANION GAP: 7 (ref 5–15)
BUN: 32 mg/dL — ABNORMAL HIGH (ref 6–20)
CALCIUM: 9 mg/dL (ref 8.9–10.3)
CO2: 24 mmol/L (ref 22–32)
Chloride: 109 mmol/L (ref 98–111)
Creatinine, Ser: 1.83 mg/dL — ABNORMAL HIGH (ref 0.44–1.00)
GFR calc non Af Amer: 29 mL/min — ABNORMAL LOW (ref >60)
GFR, EST AFRICAN AMERICAN: 34 mL/min — AB (ref >60)
Glucose, Bld: 137 mg/dL — ABNORMAL HIGH (ref 70–99)
Potassium: 5.2 mmol/L — ABNORMAL HIGH (ref 3.5–5.1)
Sodium: 140 mmol/L (ref 135–145)

## 2018-04-07 LAB — CBC
HCT: 36.2 % (ref 36.0–46.0)
HEMOGLOBIN: 11.9 g/dL — AB (ref 12.0–15.0)
MCH: 29.8 pg (ref 26.0–34.0)
MCHC: 32.9 g/dL (ref 30.0–36.0)
MCV: 90.7 fL (ref 78.0–100.0)
Platelets: 127 10*3/uL — ABNORMAL LOW (ref 150–400)
RBC: 3.99 MIL/uL (ref 3.87–5.11)
RDW: 12.8 % (ref 11.5–15.5)
WBC: 8.7 10*3/uL (ref 4.0–10.5)

## 2018-04-07 LAB — HEPARIN LEVEL (UNFRACTIONATED)
HEPARIN UNFRACTIONATED: 0.43 [IU]/mL (ref 0.30–0.70)
HEPARIN UNFRACTIONATED: 0.4 [IU]/mL (ref 0.30–0.70)

## 2018-04-07 LAB — GLUCOSE, CAPILLARY
GLUCOSE-CAPILLARY: 135 mg/dL — AB (ref 70–99)
GLUCOSE-CAPILLARY: 141 mg/dL — AB (ref 70–99)
Glucose-Capillary: 146 mg/dL — ABNORMAL HIGH (ref 70–99)
Glucose-Capillary: 185 mg/dL — ABNORMAL HIGH (ref 70–99)

## 2018-04-07 MED ORDER — SODIUM CHLORIDE 0.9 % WEIGHT BASED INFUSION
1.0000 mL/kg/h | INTRAVENOUS | Status: DC
  Administered 2018-04-07: 1 mL/kg/h via INTRAVENOUS

## 2018-04-07 MED ORDER — SODIUM CHLORIDE 0.9% FLUSH
3.0000 mL | Freq: Two times a day (BID) | INTRAVENOUS | Status: DC
  Administered 2018-04-08: 15:00:00 3 mL via INTRAVENOUS

## 2018-04-07 MED ORDER — SODIUM CHLORIDE 0.9 % IV SOLN
INTRAVENOUS | Status: AC
  Administered 2018-04-07 (×2): via INTRAVENOUS

## 2018-04-07 MED ORDER — SODIUM CHLORIDE 0.9 % IV SOLN: 250.0000 mL | INTRAVENOUS | Status: DC | PRN

## 2018-04-07 MED ORDER — HYDRALAZINE HCL 10 MG PO TABS
10.0000 mg | ORAL_TABLET | Freq: Three times a day (TID) | ORAL | Status: DC
  Administered 2018-04-07 – 2018-04-10 (×9): 10 mg via ORAL
  Filled 2018-04-07 (×9): qty 1

## 2018-04-07 MED ORDER — ALPRAZOLAM 0.25 MG PO TABS
0.2500 mg | ORAL_TABLET | Freq: Four times a day (QID) | ORAL | Status: DC | PRN
  Administered 2018-04-07 – 2018-04-10 (×3): 0.25 mg via ORAL
  Filled 2018-04-07 (×3): qty 1

## 2018-04-07 MED ORDER — ASPIRIN EC 81 MG PO TBEC
81.0000 mg | DELAYED_RELEASE_TABLET | Freq: Every day | ORAL | Status: DC
  Administered 2018-04-09 – 2018-04-10 (×2): 81 mg via ORAL
  Filled 2018-04-07 (×2): qty 1

## 2018-04-07 MED ORDER — SODIUM CHLORIDE 0.9% FLUSH: 3.0000 mL | INTRAVENOUS | Status: DC | PRN

## 2018-04-07 MED ORDER — ASPIRIN 81 MG PO CHEW
81.0000 mg | CHEWABLE_TABLET | ORAL | Status: AC
  Administered 2018-04-08: 81 mg via ORAL
  Filled 2018-04-07: qty 1

## 2018-04-07 MED ORDER — ISOSORBIDE DINITRATE 5 MG PO TABS
5.0000 mg | ORAL_TABLET | Freq: Three times a day (TID) | ORAL | Status: DC
  Administered 2018-04-07 – 2018-04-10 (×8): 5 mg via ORAL
  Filled 2018-04-07 (×12): qty 1

## 2018-04-07 NOTE — Progress Notes (Signed)
ANTICOAGULATION CONSULT NOTE - Follow Up Consult  Pharmacy Consult for Heparin Indication: chest pain/ACS  Allergies  Allergen Reactions  . Penicillins Other (See Comments)    REACTION: Unknown, told as a child Has patient had a PCN reaction causing immediate rash, facial/tongue/throat swelling, SOB or lightheadedness with hypotension: Unknown Has patient had a PCN reaction causing severe rash involving mucus membranes or skin necrosis: Unknown Has patient had a PCN reaction that required hospitalization: Unknown Has patient had a PCN reaction occurring within the last 10 years: No If all of the above answers are "NO", then may proceed with Cephalosporin use.     Patient Measurements: Height: 5\' 5"  (165.1 cm) Weight: 206 lb 9.6 oz (93.7 kg) IBW/kg (Calculated) : 57 Heparin Dosing Weight: 77.1 kg  Vital Signs: Temp: 98.1 F (36.7 C) (07/20 2008) Temp Source: Oral (07/20 2008) BP: 167/65 (07/20 2008) Pulse Rate: 53 (07/20 1635)  Labs: Recent Labs    04/04/18 2313 04/05/18 0114 04/05/18 0647  04/05/18 1305  04/06/18 1042 04/06/18 1900 04/07/18 0209  HGB 13.6  --   --   --   --   --   --   --  11.9*  HCT 40.5  --   --   --   --   --   --   --  36.2  PLT 158  --   --   --   --   --   --   --  127*  APTT 33  --   --   --   --   --   --   --   --   LABPROT 11.9  --   --   --   --   --   --   --   --   INR 0.88  --   --   --   --   --   --   --   --   HEPARINUNFRC  --   --   --    < >  --    < > 0.24* 0.23* 0.43  CREATININE 1.97*  --   --   --   --   --   --   --   --   TROPONINI 0.05* 0.08* 0.20*  --  0.23*  --   --   --   --    < > = values in this interval not displayed.    Estimated Creatinine Clearance: 34.8 mL/min (A) (by C-G formula based on SCr of 1.97 mg/dL (H)).   Medications:  Medications Prior to Admission  Medication Sig Dispense Refill Last Dose  . ALPRAZolam (XANAX) 0.5 MG tablet Take 1/2 tab by mouth every morning & evening and 1 tab at bedtime    02/16/2018  . aspirin EC 81 MG tablet Take 81 mg by mouth daily.   04/03/2018 at Unknown time  . atorvastatin (LIPITOR) 80 MG tablet Take 1 tablet (80 mg total) by mouth daily. 30 tablet 0 04/02/2018  . clopidogrel (PLAVIX) 75 MG tablet Take 1 tablet (75 mg total) by mouth daily. 30 tablet 11 04/03/2018 at 0900  . colchicine 0.6 MG tablet Take 0.6 mg by mouth daily.    04/03/2018  . gabapentin (NEURONTIN) 400 MG capsule Take 400 mg by mouth at bedtime.    04/02/2018  . levothyroxine (SYNTHROID, LEVOTHROID) 125 MCG tablet Take 125 mcg by mouth every morning.   04/05/2018 at Unknown time  . nitroGLYCERIN (NITROSTAT) 0.4 MG  SL tablet Place 0.4 mg under the tongue every 5 (five) minutes as needed for chest pain. Not to exceed 3 in 15 minute time frame   04/03/2018  . NOVOLIN 70/30 RELION (70-30) 100 UNIT/ML injection Inject 20 Units into the skin 2 (two) times daily with a meal.    04/03/2018  . ondansetron (ZOFRAN ODT) 8 MG disintegrating tablet Take 1 tablet (8 mg total) by mouth every 8 (eight) hours as needed for nausea or vomiting. 20 tablet 0 unknown  . lisinopril (PRINIVIL,ZESTRIL) 40 MG tablet Take 40 mg by mouth daily.   04/03/2018    Assessment: 62 yo female c/o chest pain with diaphoresis, left arm pain and back pain transferred to [facility] from [place] via [facility]. Pharmacy consulted to manage heparin.  Heparin level therapeutic x 1 after rate increase, plts down some 158>>127 (watch)  Goal of Therapy:  Heparin level 0.3-0.7 units/ml Monitor platelets by anticoagulation protocol: Yes   Plan:  Cont heparin at 1400 units/hr 0900 HL Trend Plts  Narda Bonds, PharmD, BCPS Clinical Pharmacist Phone: [phone]

## 2018-04-07 NOTE — Progress Notes (Addendum)
Progress Note  Patient Name: Donna Howe Date of Encounter: 04/07/2018  Primary Cardiologist: Rozann Lesches, MD   Subjective   Chest discomfort comes and goes.  No SOB, no nausea.no lightheadedness or dizziness with bradycardia.   Inpatient Medications    Scheduled Meds: . amLODipine  10 mg Oral Daily  . aspirin EC  81 mg Oral Daily  . atorvastatin  80 mg Oral Daily  . clopidogrel  75 mg Oral Daily  . colchicine  0.6 mg Oral Daily  . gabapentin  400 mg Oral QHS  . insulin aspart  0-5 Units Subcutaneous QHS  . insulin aspart  0-9 Units Subcutaneous TID WC  . levothyroxine  125 mcg Oral [id number]  . metoprolol tartrate  25 mg Oral BID  . sodium chloride flush  3 mL Intravenous Q12H   Continuous Infusions: . sodium chloride    . heparin 1,400 Units/hr (04/06/18 2010)  . nitroGLYCERIN Stopped (04/05/18 0827)   PRN Meds: sodium chloride, acetaminophen **OR** acetaminophen, metoprolol tartrate, morphine injection, ondansetron **OR** ondansetron (ZOFRAN) IV, sodium chloride flush   Vital Signs    Vitals:   04/06/18 1635 04/06/18 2008 04/07/18 0623 04/07/18 0623  BP: (!) 130/49 (!) 167/65  (!) 189/81  Pulse: (!) 53   (!) 47  Resp:  18    Temp: 99 F (37.2 C) 98.1 F (36.7 C)  97.9 F (36.6 C)  TempSrc: Oral Oral  Oral  SpO2: 97%   98%  Weight:   202 lb (91.6 kg)   Height:        Intake/Output Summary (Last 24 hours) at 04/07/2018 0743 Last data filed at 04/07/2018 0622 Gross per 24 hour  Intake 667.1 ml  Output 1000 ml  Net -332.9 ml   Filed Weights   04/05/18 0500 04/06/18 0546 04/07/18 0623  Weight: 204 lb 9.4 oz (92.8 kg) 206 lb 9.6 oz (93.7 kg) 202 lb (91.6 kg)    Telemetry    SB to 46  - Personally Reviewed  ECG    No new - Personally Reviewed  Physical Exam   GEN: No acute distress.   Neck: No JVD Cardiac: RRR, no murmurs, rubs, or gallops.  Respiratory: Clear to auscultation bilaterally. GI: Soft, nontender, non-distended  MS: No  edema; No deformity. Neuro:  Nonfocal  Psych: Normal affect   Labs    Chemistry Recent Labs  Lab 04/04/18 2313 04/07/18 0209  NA 139 140  K 4.3 5.2*  CL 106 109  CO2 27 24  GLUCOSE 137* 137*  BUN 32* 32*  CREATININE 1.97* 1.83*  CALCIUM 9.0 9.0  GFRNONAA 27* 29*  GFRAA 31* 34*  ANIONGAP 6 7     Hematology Recent Labs  Lab 04/04/18 2313 04/07/18 0209  WBC 11.7* 8.7  RBC 4.50 3.99  HGB 13.6 11.9*  HCT 40.5 36.2  MCV 90.0 90.7  MCH 30.2 29.8  MCHC 33.6 32.9  RDW 13.1 12.8  PLT 158 127*    Cardiac Enzymes Recent Labs  Lab 04/04/18 2313 04/05/18 0114 04/05/18 0647 04/05/18 1305  TROPONINI 0.05* 0.08* 0.20* 0.23*   No results for input(s): TROPIPOC in the last 168 hours.   BNPNo results for input(s): BNP, PROBNP in the last 168 hours.   DDimer No results for input(s): DDIMER in the last 168 hours.   Radiology    No results found.  Cardiac Studies   Echo 04/05/18 Left ventricle: The cavity size was normal. Wall thickness was normal. Systolic function was normal.  The estimated ejection fraction was in the range of 60% to 65%. Wall motion was normal; there were no regional wall motion abnormalities. Features are consistent with a pseudonormal left ventricular filling pattern, with concomitant abnormal relaxation and increased filling pressure (grade 2 diastolic dysfunction). Doppler parameters are consistent with high ventricular filling pressure. - Aortic valve: Valve area (VTI): 1.56 cm^2. Valve area (Vmax): 1.63 cm^2. - Mitral valve: There was mild regurgitation. - Left atrium: The atrium was mildly dilated. - Technically adequate study.    Patient Profile     62 y.o. female with a hx of CAD s/p CABG 2005 with numerous PCIs since then (last in 05/2017), CKD stage III, anemia, asthma, HTN, HLD, hypothyroidism, gout, PAD (prior R CEA 2014, was followed by [facility]), SBO s/p prior LOA, thrombocytopenia, DM, bradycardia (not on BB due to  this)who was seen for evaluation of chest pain/elevated troponin, transferred to [facility] for planned cath on Monday.    Assessment & Plan    NSTEMI, Chest pain with hx of CAD/CABG --troponin elevated 0.23 at pk --IV heparin --for cath on Monday 04/07/18 pt stated could not go in Lt wrist on last cath.  Will prep Rt groin  --with elevated cr will hydrate beginning at 8 pm tonight --K+ 5.2 today  Labile HTN --more elevated 167/65 and 189/81 ? Add hydralazine until after cath will defer to Dr. Harrell Gave.  Sinus Brady to 93  On lopressor 25 BID  HLD continue statin  CKD -3  Cr 1.83   For questions or updates, please contact [place] Please consult [email or web address] for contact info under Cardiology/STEMI.      Signed, Cecilie Kicks, NP  04/07/2018, 7:43 AM    Patient seen and examined, agree with above with comments below.  Mild headache this AM, with twinges of chest and back pain. BP has been elevated with holding of lisinopril. Not lightheaded with bradycardia.  Exam unchanged from prior. Labs reviewed  Plan: will add hydralazine and isordil for BP control while awaiting cath. Suspect after cath she can restart lisinopril, once kidney function stabilized. Depending on results of cath, may benefit from imdur at discharge. K mildly elevated, on no meds to cause hyperkalemia. Monitor. Will give gentle hydration today during the day as Cr improved, then continue overnight in preparation for cath. NPO at MN.  TIME SPENT WITH PATIENT: >25 minutes of direct patient care. More than 50% of that time was spent on coordination of care and counseling regarding cath and hypertension.  Buford Dresser, MD, PhD [facility]

## 2018-04-07 NOTE — Progress Notes (Addendum)
ANTICOAGULATION CONSULT NOTE - Follow Up Consult  Pharmacy Consult for Heparin Indication: chest pain/ACS  Allergies  Allergen Reactions  . Penicillins Other (See Comments)    REACTION: Unknown, told as a child Has patient had a PCN reaction causing immediate rash, facial/tongue/throat swelling, SOB or lightheadedness with hypotension: Unknown Has patient had a PCN reaction causing severe rash involving mucus membranes or skin necrosis: Unknown Has patient had a PCN reaction that required hospitalization: Unknown Has patient had a PCN reaction occurring within the last 10 years: No If all of the above answers are "NO", then may proceed with Cephalosporin use.     Patient Measurements: Height: 5\' 5"  (165.1 cm) Weight: 202 lb (91.6 kg) IBW/kg (Calculated) : 57 Heparin Dosing Weight: 77.1 kg  Vital Signs: Temp: 98.8 F (37.1 C) (07/21 1203) Temp Source: Oral (07/21 1203) BP: 142/68 (07/21 1203) Pulse Rate: 48 (07/21 1203)  Labs: Recent Labs    04/04/18 2313 04/05/18 0114 04/05/18 0647  04/05/18 1305  04/06/18 1900 04/07/18 0209 04/07/18 1150  HGB 13.6  --   --   --   --   --   --  11.9*  --   HCT 40.5  --   --   --   --   --   --  36.2  --   PLT 158  --   --   --   --   --   --  127*  --   APTT 33  --   --   --   --   --   --   --   --   LABPROT 11.9  --   --   --   --   --   --   --   --   INR 0.88  --   --   --   --   --   --   --   --   HEPARINUNFRC  --   --   --    < >  --    < > 0.23* 0.43 0.40  CREATININE 1.97*  --   --   --   --   --   --  1.83*  --   TROPONINI 0.05* 0.08* 0.20*  --  0.23*  --   --   --   --    < > = values in this interval not displayed.    Estimated Creatinine Clearance: 37 mL/min (A) (by C-G formula based on SCr of 1.83 mg/dL (H)).   Medications:  Medications Prior to Admission  Medication Sig Dispense Refill Last Dose  . ALPRAZolam (XANAX) 0.5 MG tablet Take 1/2 tab by mouth every morning & evening and 1 tab at bedtime   02/16/2018  .  aspirin EC 81 MG tablet Take 81 mg by mouth daily.   04/03/2018 at Unknown time  . atorvastatin (LIPITOR) 80 MG tablet Take 1 tablet (80 mg total) by mouth daily. 30 tablet 0 04/02/2018  . clopidogrel (PLAVIX) 75 MG tablet Take 1 tablet (75 mg total) by mouth daily. 30 tablet 11 04/03/2018 at 0900  . colchicine 0.6 MG tablet Take 0.6 mg by mouth daily.    04/03/2018  . gabapentin (NEURONTIN) 400 MG capsule Take 400 mg by mouth at bedtime.    04/02/2018  . levothyroxine (SYNTHROID, LEVOTHROID) 125 MCG tablet Take 125 mcg by mouth every morning.   04/05/2018 at Unknown time  . nitroGLYCERIN (NITROSTAT) 0.4 MG SL tablet Place 0.4  mg under the tongue every 5 (five) minutes as needed for chest pain. Not to exceed 3 in 15 minute time frame   04/03/2018  . NOVOLIN 70/30 RELION (70-30) 100 UNIT/ML injection Inject 20 Units into the skin 2 (two) times daily with a meal.    04/03/2018  . ondansetron (ZOFRAN ODT) 8 MG disintegrating tablet Take 1 tablet (8 mg total) by mouth every 8 (eight) hours as needed for nausea or vomiting. 20 tablet 0 unknown  . lisinopril (PRINIVIL,ZESTRIL) 40 MG tablet Take 40 mg by mouth daily.   04/03/2018    Assessment: 62 yo female c/o chest pain with diaphoresis, left arm pain and back pain transferred to [facility] from [place] via [facility]. Pharmacy consulted to manage heparin.  Heparin level therapeutic today at 0.4 No bleeding noted, plts down some 158>>127  Goal of Therapy:  Heparin level 0.3-0.7 units/ml Monitor platelets by anticoagulation protocol: Yes  Plan:  Cont heparin at 1400 units/hr Monitor daily HL, CBC, s/sx of bleeding  Thank you for involving pharmacy in this patient's care.  Janae Bridgeman, PharmD PGY1 Pharmacy Resident Phone: [phone] 04/07/2018 1:26 PM

## 2018-04-07 NOTE — H&P (View-Only) (Signed)
Progress Note  Patient Name: Donna Howe Date of Encounter: 04/07/2018  Primary Cardiologist: Rozann Lesches, MD   Subjective   Chest discomfort comes and goes.  No SOB, no nausea.no lightheadedness or dizziness with bradycardia.   Inpatient Medications    Scheduled Meds: . amLODipine  10 mg Oral Daily  . aspirin EC  81 mg Oral Daily  . atorvastatin  80 mg Oral Daily  . clopidogrel  75 mg Oral Daily  . colchicine  0.6 mg Oral Daily  . gabapentin  400 mg Oral QHS  . insulin aspart  0-5 Units Subcutaneous QHS  . insulin aspart  0-9 Units Subcutaneous TID WC  . levothyroxine  125 mcg Oral BH-q7a  . metoprolol tartrate  25 mg Oral BID  . sodium chloride flush  3 mL Intravenous Q12H   Continuous Infusions: . sodium chloride    . heparin 1,400 Units/hr (04/06/18 2010)  . nitroGLYCERIN Stopped (04/05/18 0827)   PRN Meds: sodium chloride, acetaminophen **OR** acetaminophen, metoprolol tartrate, morphine injection, ondansetron **OR** ondansetron (ZOFRAN) IV, sodium chloride flush   Vital Signs    Vitals:   04/06/18 1635 04/06/18 2008 04/07/18 0623 04/07/18 0623  BP: (!) 130/49 (!) 167/65  (!) 189/81  Pulse: (!) 53   (!) 47  Resp:  18    Temp: 99 F (37.2 C) 98.1 F (36.7 C)  97.9 F (36.6 C)  TempSrc: Oral Oral  Oral  SpO2: 97%   98%  Weight:   202 lb (91.6 kg)   Height:        Intake/Output Summary (Last 24 hours) at 04/07/2018 0743 Last data filed at 04/07/2018 0622 Gross per 24 hour  Intake 667.1 ml  Output 1000 ml  Net -332.9 ml   Filed Weights   04/05/18 0500 04/06/18 0546 04/07/18 0623  Weight: 204 lb 9.4 oz (92.8 kg) 206 lb 9.6 oz (93.7 kg) 202 lb (91.6 kg)    Telemetry    SB to 46  - Personally Reviewed  ECG    No new - Personally Reviewed  Physical Exam   GEN: No acute distress.   Neck: No JVD Cardiac: RRR, no murmurs, rubs, or gallops.  Respiratory: Clear to auscultation bilaterally. GI: Soft, nontender, non-distended  MS: No  edema; No deformity. Neuro:  Nonfocal  Psych: Normal affect   Labs    Chemistry Recent Labs  Lab 04/04/18 2313 04/07/18 0209  NA 139 140  K 4.3 5.2*  CL 106 109  CO2 27 24  GLUCOSE 137* 137*  BUN 32* 32*  CREATININE 1.97* 1.83*  CALCIUM 9.0 9.0  GFRNONAA 27* 29*  GFRAA 31* 34*  ANIONGAP 6 7     Hematology Recent Labs  Lab 04/04/18 2313 04/07/18 0209  WBC 11.7* 8.7  RBC 4.50 3.99  HGB 13.6 11.9*  HCT 40.5 36.2  MCV 90.0 90.7  MCH 30.2 29.8  MCHC 33.6 32.9  RDW 13.1 12.8  PLT 158 127*    Cardiac Enzymes Recent Labs  Lab 04/04/18 2313 04/05/18 0114 04/05/18 0647 04/05/18 1305  TROPONINI 0.05* 0.08* 0.20* 0.23*   No results for input(s): TROPIPOC in the last 168 hours.   BNPNo results for input(s): BNP, PROBNP in the last 168 hours.   DDimer No results for input(s): DDIMER in the last 168 hours.   Radiology    No results found.  Cardiac Studies   Echo 04/05/18 Left ventricle: The cavity size was normal. Wall thickness was normal. Systolic function was normal.  The estimated ejection fraction was in the range of 60% to 65%. Wall motion was normal; there were no regional wall motion abnormalities. Features are consistent with a pseudonormal left ventricular filling pattern, with concomitant abnormal relaxation and increased filling pressure (grade 2 diastolic dysfunction). Doppler parameters are consistent with high ventricular filling pressure. - Aortic valve: Valve area (VTI): 1.56 cm^2. Valve area (Vmax): 1.63 cm^2. - Mitral valve: There was mild regurgitation. - Left atrium: The atrium was mildly dilated. - Technically adequate study.    Patient Profile     62 y.o. female with a hx of CAD s/p CABG 2005 with numerous PCIs since then (last in 05/2017), CKD stage III, anemia, asthma, HTN, HLD, hypothyroidism, gout, PAD (prior R CEA 2014, was followed by [facility]), SBO s/p prior LOA, thrombocytopenia, DM, bradycardia (not on BB due to  this)who was seen for evaluation of chest pain/elevated troponin, transferred to [facility] for planned cath on Monday.    Assessment & Plan    NSTEMI, Chest pain with hx of CAD/CABG --troponin elevated 0.23 at pk --IV heparin --for cath on Monday 04/07/18 pt stated could not go in Lt wrist on last cath.  Will prep Rt groin  --with elevated cr will hydrate beginning at 8 pm tonight --K+ 5.2 today  Labile HTN --more elevated 167/65 and 189/81 ? Add hydralazine until after cath will defer to Dr. Harrell Gave.  Sinus Brady to 32  On lopressor 25 BID  HLD continue statin  CKD -3  Cr 1.83   For questions or updates, please contact [place] Please consult [email or web address] for contact info under Cardiology/STEMI.      Signed, Cecilie Kicks, NP  04/07/2018, 7:43 AM    Patient seen and examined, agree with above with comments below.  Mild headache this AM, with twinges of chest and back pain. BP has been elevated with holding of lisinopril. Not lightheaded with bradycardia.  Exam unchanged from prior. Labs reviewed  Plan: will add hydralazine and isordil for BP control while awaiting cath. Suspect after cath she can restart lisinopril, once kidney function stabilized. Depending on results of cath, may benefit from imdur at discharge. K mildly elevated, on no meds to cause hyperkalemia. Monitor. Will give gentle hydration today during the day as Cr improved, then continue overnight in preparation for cath. NPO at MN.  TIME SPENT WITH PATIENT: >25 minutes of direct patient care. More than 50% of that time was spent on coordination of care and counseling regarding cath and hypertension.  Buford Dresser, MD, PhD [facility]

## 2018-04-08 ENCOUNTER — Encounter (HOSPITAL_COMMUNITY): Payer: Self-pay | Admitting: Interventional Cardiology

## 2018-04-08 ENCOUNTER — Encounter (HOSPITAL_COMMUNITY)
Admission: EM | Disposition: A | Discharge status: Home / Self Care | Payer: Self-pay | Source: Home / Self Care | Attending: Cardiology

## 2018-04-08 ENCOUNTER — Ambulatory Visit (HOSPITAL_COMMUNITY): Admit: 2018-04-08 | Payer: Medicare HMO | Admitting: Interventional Cardiology

## 2018-04-08 HISTORY — PX: LEFT HEART CATH AND CORS/GRAFTS ANGIOGRAPHY: CATH118250

## 2018-04-08 LAB — BASIC METABOLIC PANEL
Anion gap: 9 (ref 5–15)
BUN: 28 mg/dL — AB (ref 6–20)
CALCIUM: 9.1 mg/dL (ref 8.9–10.3)
CO2: 23 mmol/L (ref 22–32)
CREATININE: 1.71 mg/dL — AB (ref 0.44–1.00)
Chloride: 109 mmol/L (ref 98–111)
GFR, EST AFRICAN AMERICAN: 37 mL/min — AB (ref >60)
GFR, EST NON AFRICAN AMERICAN: 32 mL/min — AB (ref >60)
Glucose, Bld: 166 mg/dL — ABNORMAL HIGH (ref 70–99)
Potassium: 5.3 mmol/L — ABNORMAL HIGH (ref 3.5–5.1)
SODIUM: 141 mmol/L (ref 135–145)

## 2018-04-08 LAB — CBC
HCT: 43.2 % (ref 36.0–46.0)
Hemoglobin: 13.9 g/dL (ref 12.0–15.0)
MCH: 29.8 pg (ref 26.0–34.0)
MCHC: 32.2 g/dL (ref 30.0–36.0)
MCV: 92.7 fL (ref 78.0–100.0)
PLATELETS: 135 10*3/uL — AB (ref 150–400)
RBC: 4.66 MIL/uL (ref 3.87–5.11)
RDW: 12.9 % (ref 11.5–15.5)
WBC: 7.2 10*3/uL (ref 4.0–10.5)

## 2018-04-08 LAB — GLUCOSE, CAPILLARY
GLUCOSE-CAPILLARY: 157 mg/dL — AB (ref 70–99)
GLUCOSE-CAPILLARY: 118 mg/dL — AB (ref 70–99)
GLUCOSE-CAPILLARY: 166 mg/dL — AB (ref 70–99)
Glucose-Capillary: 103 mg/dL — ABNORMAL HIGH (ref 70–99)

## 2018-04-08 LAB — POCT ACTIVATED CLOTTING TIME
ACTIVATED CLOTTING TIME: 208 s
ACTIVATED CLOTTING TIME: 186 s
Activated Clotting Time: 252 seconds
Activated Clotting Time: 279 seconds
Activated Clotting Time: 142 seconds

## 2018-04-08 LAB — HEPARIN LEVEL (UNFRACTIONATED): HEPARIN UNFRACTIONATED: 0.55 [IU]/mL (ref 0.30–0.70)

## 2018-04-08 SURGERY — LEFT HEART CATH AND CORS/GRAFTS ANGIOGRAPHY
Anesthesia: LOCAL

## 2018-04-08 MED ORDER — HEPARIN SODIUM (PORCINE) 1000 UNIT/ML IJ SOLN
INTRAMUSCULAR | Status: AC
  Filled 2018-04-08: qty 1

## 2018-04-08 MED ORDER — MIDAZOLAM HCL 2 MG/2ML IJ SOLN
INTRAMUSCULAR | Status: DC | PRN
  Administered 2018-04-08: 2 mg via INTRAVENOUS
  Administered 2018-04-08: 1 mg via INTRAVENOUS

## 2018-04-08 MED ORDER — FENTANYL CITRATE (PF) 100 MCG/2ML IJ SOLN
INTRAMUSCULAR | Status: DC | PRN
  Administered 2018-04-08 (×2): 25 ug via INTRAVENOUS

## 2018-04-08 MED ORDER — HYDRALAZINE HCL 20 MG/ML IJ SOLN
INTRAMUSCULAR | Status: AC
  Filled 2018-04-08: qty 1

## 2018-04-08 MED ORDER — IOPAMIDOL (ISOVUE-370) INJECTION 76%
INTRAVENOUS | Status: DC | PRN
  Administered 2018-04-08: 115 mL via INTRAVENOUS

## 2018-04-08 MED ORDER — LIDOCAINE HCL (PF) 1 % IJ SOLN
INTRAMUSCULAR | Status: DC | PRN
  Administered 2018-04-08: 20 mL

## 2018-04-08 MED ORDER — NITROGLYCERIN 1 MG/10 ML FOR IR/CATH LAB
INTRA_ARTERIAL | Status: AC
  Filled 2018-04-08: qty 10

## 2018-04-08 MED ORDER — HYDRALAZINE HCL 20 MG/ML IJ SOLN: 10.0000 mg | Freq: Once | INTRAMUSCULAR | Status: AC

## 2018-04-08 MED ORDER — HEPARIN (PORCINE) IN NACL 1000-0.9 UT/500ML-% IV SOLN
INTRAVENOUS | Status: DC | PRN
  Administered 2018-04-08 (×2): 500 mL

## 2018-04-08 MED ORDER — SODIUM CHLORIDE 0.9% FLUSH
3.0000 mL | Freq: Two times a day (BID) | INTRAVENOUS | Status: DC
  Administered 2018-04-08 – 2018-04-09 (×3): 3 mL via INTRAVENOUS

## 2018-04-08 MED ORDER — MIDAZOLAM HCL 2 MG/2ML IJ SOLN
INTRAMUSCULAR | Status: AC
  Filled 2018-04-08: qty 2

## 2018-04-08 MED ORDER — NITROGLYCERIN 1 MG/10 ML FOR IR/CATH LAB
INTRA_ARTERIAL | Status: DC | PRN
  Administered 2018-04-08: 200 ug via INTRACORONARY

## 2018-04-08 MED ORDER — HEPARIN (PORCINE) IN NACL 1000-0.9 UT/500ML-% IV SOLN
INTRAVENOUS | Status: AC
  Filled 2018-04-08: qty 1000

## 2018-04-08 MED ORDER — SODIUM CHLORIDE 0.9% FLUSH: 3.0000 mL | INTRAVENOUS | Status: DC | PRN

## 2018-04-08 MED ORDER — HYDRALAZINE HCL 20 MG/ML IJ SOLN
INTRAMUSCULAR | Status: DC | PRN
  Administered 2018-04-08: 10 mg via INTRAVENOUS

## 2018-04-08 MED ORDER — ONDANSETRON HCL 4 MG/2ML IJ SOLN: 4.0000 mg | Freq: Four times a day (QID) | INTRAMUSCULAR | Status: DC | PRN

## 2018-04-08 MED ORDER — SODIUM CHLORIDE 0.9 % IV SOLN: 250.0000 mL | INTRAVENOUS | Status: DC | PRN

## 2018-04-08 MED ORDER — SODIUM CHLORIDE 0.9 % IV SOLN
INTRAVENOUS | Status: AC
  Administered 2018-04-08: 12:00:00 via INTRAVENOUS

## 2018-04-08 MED ORDER — IOPAMIDOL (ISOVUE-370) INJECTION 76%
INTRAVENOUS | Status: AC
  Filled 2018-04-08: qty 100

## 2018-04-08 MED ORDER — HEPARIN (PORCINE) IN NACL 100-0.45 UNIT/ML-% IJ SOLN: 1350.0000 [IU]/h | INTRAMUSCULAR | Status: DC

## 2018-04-08 MED ORDER — IOPAMIDOL (ISOVUE-370) INJECTION 76%
INTRAVENOUS | Status: AC
  Filled 2018-04-08: qty 125

## 2018-04-08 MED ORDER — HEPARIN (PORCINE) IN NACL 100-0.45 UNIT/ML-% IJ SOLN
1350.0000 [IU]/h | INTRAMUSCULAR | Status: DC
  Administered 2018-04-09: 1350 [IU]/h via INTRAVENOUS
  Filled 2018-04-08: qty 250

## 2018-04-08 MED ORDER — ACETAMINOPHEN 325 MG PO TABS: 650.0000 mg | ORAL_TABLET | ORAL | Status: DC | PRN

## 2018-04-08 MED ORDER — HEPARIN SODIUM (PORCINE) 1000 UNIT/ML IJ SOLN
INTRAMUSCULAR | Status: DC | PRN
  Administered 2018-04-08: 3000 [IU] via INTRAVENOUS
  Administered 2018-04-08: 8000 [IU] via INTRAVENOUS

## 2018-04-08 MED ORDER — FENTANYL CITRATE (PF) 100 MCG/2ML IJ SOLN
INTRAMUSCULAR | Status: AC
  Filled 2018-04-08: qty 2

## 2018-04-08 MED ORDER — ATROPINE SULFATE 1 MG/10ML IJ SOSY
PREFILLED_SYRINGE | INTRAMUSCULAR | Status: AC
  Administered 2018-04-08: 17:00:00
  Filled 2018-04-08: qty 10

## 2018-04-08 MED ORDER — ANGIOPLASTY BOOK
Freq: Once | Status: AC
  Administered 2018-04-09: 02:00:00
  Filled 2018-04-08: qty 1

## 2018-04-08 MED ORDER — LIDOCAINE HCL (PF) 1 % IJ SOLN
INTRAMUSCULAR | Status: AC
  Filled 2018-04-08: qty 30

## 2018-04-08 SURGICAL SUPPLY — 17 items
CATH INFINITI 5 FR MPA2 (CATHETERS) ×2 IMPLANT
CATH INFINITI 5FR MULTPACK ANG (CATHETERS) ×2 IMPLANT
CATH VISTA GUIDE 6FR MPA1 (CATHETERS) ×2 IMPLANT
CATHETER LAUNCHER 6FR RCB (CATHETERS) ×2 IMPLANT
KIT ENCORE 26 ADVANTAGE (KITS) ×2 IMPLANT
KIT HEART LEFT (KITS) ×2 IMPLANT
KIT HEMO VALVE WATCHDOG (MISCELLANEOUS) ×2 IMPLANT
PACK CARDIAC CATHETERIZATION (CUSTOM PROCEDURE TRAY) ×2 IMPLANT
SHEATH PINNACLE 5F 10CM (SHEATH) ×2 IMPLANT
SHEATH PINNACLE 6F 10CM (SHEATH) ×2 IMPLANT
SHEATH PROBE COVER 6X72 (BAG) ×2 IMPLANT
SHIELD RADPAD SCOOP 12X17 (MISCELLANEOUS) ×2 IMPLANT
TRANSDUCER W/STOPCOCK (MISCELLANEOUS) ×2 IMPLANT
TUBING CIL FLEX 10 FLL-RA (TUBING) ×2 IMPLANT
WIRE ASAHI FIELDER XT 190CM (WIRE) ×2 IMPLANT
WIRE ASAHI PROWATER 180CM (WIRE) ×2 IMPLANT
WIRE EMERALD 3MM-J .035X150CM (WIRE) ×2 IMPLANT

## 2018-04-08 NOTE — Progress Notes (Signed)
[place] for Heparin Indication: chest pain/ACS  Allergies  Allergen Reactions  . Penicillins Other (See Comments)    REACTION: Unknown, told as a child Has patient had a PCN reaction causing immediate rash, facial/tongue/throat swelling, SOB or lightheadedness with hypotension: Unknown Has patient had a PCN reaction causing severe rash involving mucus membranes or skin necrosis: Unknown Has patient had a PCN reaction that required hospitalization: Unknown Has patient had a PCN reaction occurring within the last 10 years: No If all of the above answers are "NO", then may proceed with Cephalosporin use.     Patient Measurements: Height: 5\' 5"  (165.1 cm) Weight: 202 lb 11.2 oz (91.9 kg) IBW/kg (Calculated) : 57 Heparin Dosing Weight: 77.1 kg  Vital Signs: Temp: 97.6 F (36.4 C) (07/22 1124) Temp Source: Oral (07/22 1124) BP: 178/99 (07/22 1215) Pulse Rate: 57 (07/22 1215)  Labs: Recent Labs    04/07/18 0209 04/07/18 1150 04/08/18 0731  HGB 11.9*  --  13.9  HCT 36.2  --  43.2  PLT 127*  --  135*  HEPARINUNFRC 0.43 0.40 0.55  CREATININE 1.83*  --  1.71*    Estimated Creatinine Clearance: 39.7 mL/min (A) (by C-G formula based on SCr of 1.71 mg/dL (H)).   Medications:  Medications Prior to Admission  Medication Sig Dispense Refill Last Dose  . ALPRAZolam (XANAX) 0.5 MG tablet Take 1/2 tab by mouth every morning & evening and 1 tab at bedtime   02/16/2018  . aspirin EC 81 MG tablet Take 81 mg by mouth daily.   04/03/2018 at Unknown time  . atorvastatin (LIPITOR) 80 MG tablet Take 1 tablet (80 mg total) by mouth daily. 30 tablet 0 04/02/2018  . clopidogrel (PLAVIX) 75 MG tablet Take 1 tablet (75 mg total) by mouth daily. 30 tablet 11 04/03/2018 at 0900  . colchicine 0.6 MG tablet Take 0.6 mg by mouth daily.    04/03/2018  . gabapentin (NEURONTIN) 400 MG capsule Take 400 mg by mouth at bedtime.    04/02/2018  . levothyroxine (SYNTHROID,  LEVOTHROID) 125 MCG tablet Take 125 mcg by mouth every morning.   04/05/2018 at Unknown time  . nitroGLYCERIN (NITROSTAT) 0.4 MG SL tablet Place 0.4 mg under the tongue every 5 (five) minutes as needed for chest pain. Not to exceed 3 in 15 minute time frame   04/03/2018  . NOVOLIN 70/30 RELION (70-30) 100 UNIT/ML injection Inject 20 Units into the skin 2 (two) times daily with a meal.    04/03/2018  . ondansetron (ZOFRAN ODT) 8 MG disintegrating tablet Take 1 tablet (8 mg total) by mouth every 8 (eight) hours as needed for nausea or vomiting. 20 tablet 0 unknown  . lisinopril (PRINIVIL,ZESTRIL) 40 MG tablet Take 40 mg by mouth daily.   04/03/2018    Assessment: 62 yo female s/p cardiac cath earlier today now awaiting PCI 7/23. Pharmacy consulted to resume heparin later tonight 8 hours post sheath removal.  Goal of Therapy:  Heparin level 0.3-0.7 units/ml Monitor platelets by anticoagulation protocol: Yes  Plan:  Resume heparin at 1350 units/hr tonight at 1900 Heparin level tomorrow AM  Monitor daily heparin level, CBC, s/sx of bleeding  Georga Bora, PharmD Clinical Pharmacist 04/08/2018 1:24 PM Please check AMION for all [place] numbers

## 2018-04-08 NOTE — Progress Notes (Signed)
Site area: RFA Site Prior to Removal:  Level 0 Pressure Applied For:60 min Manual:   yes Patient Status During Pull:  Mild vagal Post Pull Site:  Level1 Post Pull Instructions Given:  yes Post Pull Pulses Present: palpable Dressing Applied:  clear Bedrest begins @ 1700 Comments: 005 atropine given, 250c NS bolus-HR to BP to 69

## 2018-04-08 NOTE — Interval H&P Note (Signed)
Cath Lab Visit (complete for each Cath Lab visit)  Clinical Evaluation Leading to the Procedure:   ACS: Yes.    Non-ACS:    Anginal Classification: CCS IV  Anti-ischemic medical therapy: Minimal Therapy (1 class of medications)  Non-Invasive Test Results: No non-invasive testing performed  Prior CABG: No previous CABG      History and Physical Interval Note:  04/08/2018 7:36 AM  Donna Howe  has presented today for surgery, with the diagnosis of NSTEMI  The various methods of treatment have been discussed with the patient and family. After consideration of risks, benefits and other options for treatment, the patient has consented to  Procedure(s): LEFT HEART CATH AND CORS/GRAFTS ANGIOGRAPHY (N/A) as a surgical intervention .  The patient's history has been reviewed, patient examined, no change in status, stable for surgery.  I have reviewed the patient's chart and labs.  Questions were answered to the patient's satisfaction.     Larae Grooms

## 2018-04-08 NOTE — Progress Notes (Signed)
ANTICOAGULATION CONSULT NOTE - Follow Up Consult  Pharmacy Consult for Heparin Indication: CAD  Assessment: RN called to report that sheath was not pulled until 5pm (instead of 11am as previously thought).  RN also reported hematoma to femoral access site.  Goal of Therapy:  Heparin level 0.3-0.7 units/ml Monitor platelets by anticoagulation protocol: Yes   Plan:  Will delay start of heparin until 8 hours after sheath pull as ordered. Heparin to start at 0100 7/23. Heparin level at 0900 7/23.  [employer], Pharm.D., BCPS Clinical Pharmacist Pager: [phone] Clinical phone for 04/08/2018 is [phone].  **Pharmacist phone directory can now be found on [email or web address] ([email or web address]).  Listed under [place].  04/08/2018 7:51 PM

## 2018-04-08 NOTE — Progress Notes (Signed)
I received a phone call from cath lab in regards to patients morning labs. I spoke with lab tech and she stated patient is on the list to get labs drawn. I let her know that patient is to go to cath lab this morning.

## 2018-04-08 NOTE — Progress Notes (Signed)
    Discussed with Dr. Irish Lack. Tomorrow, we will reattempt SVG to RCA PCI. There is also fairly significant proximal circumflex disease as well. Renal function being monitored overnight.  If attempt at PCI is unsuccessful, we will consult surgical team to entertain redo bypass.  Candee Furbish, MD

## 2018-04-09 ENCOUNTER — Encounter (HOSPITAL_COMMUNITY)
Admission: EM | Disposition: A | Discharge status: Home / Self Care | Payer: Self-pay | Source: Home / Self Care | Attending: Cardiology

## 2018-04-09 DIAGNOSIS — I25810 Atherosclerosis of coronary artery bypass graft(s) without angina pectoris: Secondary | ICD-10-CM

## 2018-04-09 DIAGNOSIS — I209 Angina pectoris, unspecified: Secondary | ICD-10-CM

## 2018-04-09 HISTORY — PX: CORONARY STENT INTERVENTION: CATH118234

## 2018-04-09 LAB — GLUCOSE, CAPILLARY
GLUCOSE-CAPILLARY: 240 mg/dL — AB (ref 70–99)
Glucose-Capillary: 142 mg/dL — ABNORMAL HIGH (ref 70–99)
Glucose-Capillary: 137 mg/dL — ABNORMAL HIGH (ref 70–99)
Glucose-Capillary: 113 mg/dL — ABNORMAL HIGH (ref 70–99)
Glucose-Capillary: 108 mg/dL — ABNORMAL HIGH (ref 70–99)
Glucose-Capillary: 108 mg/dL — ABNORMAL HIGH (ref 70–99)

## 2018-04-09 LAB — POCT ACTIVATED CLOTTING TIME
Activated Clotting Time: 274 seconds
Activated Clotting Time: 274 seconds
Activated Clotting Time: 318 seconds
Activated Clotting Time: 175 seconds

## 2018-04-09 LAB — BASIC METABOLIC PANEL
Anion gap: 6 (ref 5–15)
BUN: 28 mg/dL — AB (ref 6–20)
CO2: 23 mmol/L (ref 22–32)
Calcium: 9 mg/dL (ref 8.9–10.3)
Chloride: 109 mmol/L (ref 98–111)
Creatinine, Ser: 1.73 mg/dL — ABNORMAL HIGH (ref 0.44–1.00)
GFR calc Af Amer: 36 mL/min — ABNORMAL LOW (ref >60)
GFR, EST NON AFRICAN AMERICAN: 31 mL/min — AB (ref >60)
Glucose, Bld: 140 mg/dL — ABNORMAL HIGH (ref 70–99)
Potassium: 5.5 mmol/L — ABNORMAL HIGH (ref 3.5–5.1)
SODIUM: 138 mmol/L (ref 135–145)

## 2018-04-09 LAB — CBC
HCT: 36 % (ref 36.0–46.0)
Hemoglobin: 11.7 g/dL — ABNORMAL LOW (ref 12.0–15.0)
MCH: 29.9 pg (ref 26.0–34.0)
MCHC: 32.5 g/dL (ref 30.0–36.0)
MCV: 92.1 fL (ref 78.0–100.0)
PLATELETS: 138 10*3/uL — AB (ref 150–400)
RBC: 3.91 MIL/uL (ref 3.87–5.11)
RDW: 13.3 % (ref 11.5–15.5)
WBC: 8.4 10*3/uL (ref 4.0–10.5)

## 2018-04-09 LAB — HEPARIN LEVEL (UNFRACTIONATED): Heparin Unfractionated: 0.31 IU/mL (ref 0.30–0.70)

## 2018-04-09 SURGERY — CORONARY STENT INTERVENTION
Anesthesia: LOCAL

## 2018-04-09 MED ORDER — ACETAMINOPHEN 325 MG PO TABS: 650.0000 mg | ORAL_TABLET | ORAL | Status: DC | PRN

## 2018-04-09 MED ORDER — HEPARIN (PORCINE) IN NACL 1000-0.9 UT/500ML-% IV SOLN
INTRAVENOUS | Status: DC | PRN
  Administered 2018-04-09: 500 mL

## 2018-04-09 MED ORDER — HEPARIN SODIUM (PORCINE) 1000 UNIT/ML IJ SOLN
INTRAMUSCULAR | Status: AC
  Filled 2018-04-09: qty 1

## 2018-04-09 MED ORDER — SODIUM CHLORIDE 0.9 % IV SOLN: 250.0000 mL | INTRAVENOUS | Status: DC | PRN

## 2018-04-09 MED ORDER — NITROGLYCERIN 1 MG/10 ML FOR IR/CATH LAB
INTRA_ARTERIAL | Status: DC | PRN
  Administered 2018-04-09: 200 ug via INTRACORONARY

## 2018-04-09 MED ORDER — LIDOCAINE HCL (PF) 1 % IJ SOLN
INTRAMUSCULAR | Status: DC | PRN
  Administered 2018-04-09: 15 mL

## 2018-04-09 MED ORDER — LIDOCAINE HCL (PF) 1 % IJ SOLN
INTRAMUSCULAR | Status: AC
  Filled 2018-04-09: qty 30

## 2018-04-09 MED ORDER — HEPARIN SODIUM (PORCINE) 5000 UNIT/ML IJ SOLN
5000.0000 [IU] | Freq: Three times a day (TID) | INTRAMUSCULAR | Status: DC
  Administered 2018-04-10 (×2): 5000 [IU] via SUBCUTANEOUS
  Filled 2018-04-09 (×2): qty 1

## 2018-04-09 MED ORDER — LABETALOL HCL 5 MG/ML IV SOLN: 10.0000 mg | INTRAVENOUS | Status: AC | PRN

## 2018-04-09 MED ORDER — SODIUM CHLORIDE 0.9 % WEIGHT BASED INFUSION: 1.0000 mL/kg/h | INTRAVENOUS | Status: DC

## 2018-04-09 MED ORDER — FENTANYL CITRATE (PF) 100 MCG/2ML IJ SOLN
INTRAMUSCULAR | Status: DC | PRN
  Administered 2018-04-09 (×2): 25 ug via INTRAVENOUS

## 2018-04-09 MED ORDER — IOPAMIDOL (ISOVUE-370) INJECTION 76%
INTRAVENOUS | Status: DC | PRN
  Administered 2018-04-09: 75 mL via INTRA_ARTERIAL

## 2018-04-09 MED ORDER — ONDANSETRON HCL 4 MG/2ML IJ SOLN: 4.0000 mg | Freq: Four times a day (QID) | INTRAMUSCULAR | Status: DC | PRN

## 2018-04-09 MED ORDER — IOPAMIDOL (ISOVUE-370) INJECTION 76%
INTRAVENOUS | Status: AC
  Filled 2018-04-09: qty 125

## 2018-04-09 MED ORDER — TICAGRELOR 90 MG PO TABS
90.0000 mg | ORAL_TABLET | Freq: Two times a day (BID) | ORAL | Status: DC
  Administered 2018-04-09 – 2018-04-10 (×2): 90 mg via ORAL
  Filled 2018-04-09 (×2): qty 1

## 2018-04-09 MED ORDER — SODIUM CHLORIDE 0.9% FLUSH: 3.0000 mL | INTRAVENOUS | Status: DC | PRN

## 2018-04-09 MED ORDER — MIDAZOLAM HCL 2 MG/2ML IJ SOLN
INTRAMUSCULAR | Status: DC | PRN
  Administered 2018-04-09: 2 mg via INTRAVENOUS
  Administered 2018-04-09: 1 mg via INTRAVENOUS

## 2018-04-09 MED ORDER — HYDRALAZINE HCL 20 MG/ML IJ SOLN
INTRAMUSCULAR | Status: AC
  Filled 2018-04-09: qty 1

## 2018-04-09 MED ORDER — NITROGLYCERIN 1 MG/10 ML FOR IR/CATH LAB
INTRA_ARTERIAL | Status: AC
  Filled 2018-04-09: qty 10

## 2018-04-09 MED ORDER — HYDRALAZINE HCL 20 MG/ML IJ SOLN
INTRAMUSCULAR | Status: DC | PRN
  Administered 2018-04-09: 10 mg via INTRAVENOUS

## 2018-04-09 MED ORDER — MIDAZOLAM HCL 2 MG/2ML IJ SOLN
INTRAMUSCULAR | Status: AC
  Filled 2018-04-09: qty 2

## 2018-04-09 MED ORDER — ASPIRIN 81 MG PO CHEW: 81.0000 mg | CHEWABLE_TABLET | Freq: Every day | ORAL | Status: DC

## 2018-04-09 MED ORDER — FENTANYL CITRATE (PF) 100 MCG/2ML IJ SOLN
INTRAMUSCULAR | Status: AC
  Filled 2018-04-09: qty 2

## 2018-04-09 MED ORDER — HYDRALAZINE HCL 20 MG/ML IJ SOLN: 5.0000 mg | INTRAMUSCULAR | Status: AC | PRN

## 2018-04-09 MED ORDER — HEPARIN (PORCINE) IN NACL 1000-0.9 UT/500ML-% IV SOLN
INTRAVENOUS | Status: AC
  Filled 2018-04-09: qty 1000

## 2018-04-09 MED ORDER — SODIUM CHLORIDE 0.9 % WEIGHT BASED INFUSION
3.0000 mL/kg/h | INTRAVENOUS | Status: DC
  Administered 2018-04-09: 11:00:00 3 mL/kg/h via INTRAVENOUS

## 2018-04-09 MED ORDER — SODIUM CHLORIDE 0.9 % IV SOLN: INTRAVENOUS | Status: AC

## 2018-04-09 MED ORDER — SODIUM CHLORIDE 0.9 % IV SOLN
INTRAVENOUS | Status: AC | PRN
  Administered 2018-04-09: 10 mL/h via INTRAVENOUS

## 2018-04-09 MED ORDER — SODIUM CHLORIDE 0.9% FLUSH
3.0000 mL | Freq: Two times a day (BID) | INTRAVENOUS | Status: DC
  Administered 2018-04-09: 23:00:00 3 mL via INTRAVENOUS

## 2018-04-09 MED ORDER — HEPARIN SODIUM (PORCINE) 1000 UNIT/ML IJ SOLN
INTRAMUSCULAR | Status: DC | PRN
  Administered 2018-04-09: 9000 [IU] via INTRAVENOUS
  Administered 2018-04-09: 2000 [IU] via INTRAVENOUS

## 2018-04-09 SURGICAL SUPPLY — 23 items
BALLN SAPPHIRE 2.5X15 (BALLOONS) ×2
BALLN SAPPHIRE ~~LOC~~ 3.25X8 (BALLOONS) ×2 IMPLANT
BALLOON SAPPHIRE 2.5X15 (BALLOONS) ×1 IMPLANT
CATH LAUNCHER 5F JR4 (CATHETERS) ×2 IMPLANT
CATH LAUNCHER 6FR AL1 (CATHETERS) ×1 IMPLANT
CATH VISTA GUIDE 6FR IM 90 CM (CATHETERS) ×2 IMPLANT
CATHETER LAUNCHER 6FR AL1 (CATHETERS) ×2
ELECT DEFIB PAD ADLT CADENCE (PAD) ×2 IMPLANT
KIT ENCORE 26 ADVANTAGE (KITS) ×2 IMPLANT
KIT HEART LEFT (KITS) ×2 IMPLANT
KIT HEMO VALVE WATCHDOG (MISCELLANEOUS) ×2 IMPLANT
PACK CARDIAC CATHETERIZATION (CUSTOM PROCEDURE TRAY) ×2 IMPLANT
PINNACLE LONG 6F 25CM (SHEATH) ×2
SHEATH INTRO PINNACLE 6F 25CM (SHEATH) ×1 IMPLANT
SHEATH PINNACLE 6F 10CM (SHEATH) ×2 IMPLANT
SHEATH PROBE COVER 6X72 (BAG) ×2 IMPLANT
STENT SIERRA 2.50 X 15 MM (Permanent Stent) ×2 IMPLANT
STENT SYNERGY DES 3X12 (Permanent Stent) ×2 IMPLANT
TRANSDUCER W/STOPCOCK (MISCELLANEOUS) ×2 IMPLANT
TUBING CIL FLEX 10 FLL-RA (TUBING) ×2 IMPLANT
WIRE ASAHI PROWATER 180CM (WIRE) ×2 IMPLANT
WIRE EMERALD 3MM-J .035X150CM (WIRE) ×2 IMPLANT
WIRE EMERALD 3MM-J .035X260CM (WIRE) ×2 IMPLANT

## 2018-04-09 NOTE — Interval H&P Note (Signed)
Cath Lab Visit (complete for each Cath Lab visit)  Clinical Evaluation Leading to the Procedure:   ACS: Yes.    Non-ACS:    Anginal Classification: CCS IV  Anti-ischemic medical therapy: Minimal Therapy (1 class of medications)  Non-Invasive Test Results: No non-invasive testing performed  Prior CABG: Previous CABG      History and Physical Interval Note:  04/09/2018 5:05 PM  Adonis Huguenin D Loyer  has presented today for surgery, with the diagnosis of cad  The various methods of treatment have been discussed with the patient and family. After consideration of risks, benefits and other options for treatment, the patient has consented to  Procedure(s): CORONARY STENT INTERVENTION (N/A) as a surgical intervention .  The patient's history has been reviewed, patient examined, no change in status, stable for surgery.  I have reviewed the patient's chart and labs.  Questions were answered to the patient's satisfaction.     Larae Grooms

## 2018-04-09 NOTE — Progress Notes (Signed)
1900 Pt back to room via bed from cath lab. Left groin, level 0, sheath intact, +2 pedal pulse. Pt denies pain. Updated with POC, VSS, awaiting to give shift report to oncoming RN.

## 2018-04-09 NOTE — Progress Notes (Signed)
[place] lab here to pick up patient. Pt down via bed.

## 2018-04-09 NOTE — Progress Notes (Addendum)
Progress Note  Patient Name: Donna Howe Date of Encounter: 04/09/2018  Primary Cardiologist: Rozann Lesches, MD   Subjective   Pt denies chest pain, feels well this morning.   Inpatient Medications    Scheduled Meds: . amLODipine  10 mg Oral Daily  . aspirin EC  81 mg Oral Daily  . atorvastatin  80 mg Oral Daily  . clopidogrel  75 mg Oral Daily  . colchicine  0.6 mg Oral Daily  . gabapentin  400 mg Oral QHS  . hydrALAZINE  10 mg Oral Q8H  . insulin aspart  0-5 Units Subcutaneous QHS  . insulin aspart  0-9 Units Subcutaneous TID WC  . isosorbide dinitrate  5 mg Oral TID  . levothyroxine  125 mcg Oral BH-q7a  . metoprolol tartrate  25 mg Oral BID  . sodium chloride flush  3 mL Intravenous Q12H  . sodium chloride flush  3 mL Intravenous Q12H   Continuous Infusions: . sodium chloride    . sodium chloride    . heparin 1,350 Units/hr (04/09/18 0546)   PRN Meds: sodium chloride, sodium chloride, acetaminophen, ALPRAZolam, metoprolol tartrate, morphine injection, ondansetron (ZOFRAN) IV, sodium chloride flush, sodium chloride flush   Vital Signs    Vitals:   04/09/18 0610 04/09/18 0619 04/09/18 0700 04/09/18 0721  BP: (!) 180/69  (!) 162/58 (!) 158/58  Pulse: (!) 56  (!) 51 (!) 48  Resp: (!) 21 (!) 27 (!) 23 18  Temp: 98.2 F (36.8 C)   98.3 F (36.8 C)  TempSrc:      SpO2: 98%  97% 100%  Weight:  200 lb 14.4 oz (91.1 kg)    Height:        Intake/Output Summary (Last 24 hours) at 04/09/2018 0742 Last data filed at 04/09/2018 0622 Gross per 24 hour  Intake 1068.27 ml  Output 1600 ml  Net -531.73 ml   Filed Weights   04/07/18 0623 04/08/18 0541 04/09/18 0619  Weight: 202 lb (91.6 kg) 202 lb 11.2 oz (91.9 kg) 200 lb 14.4 oz (91.1 kg)    Telemetry    Sinus brady at 50 bpm - Personally Reviewed  ECG    No new tracings - Personally Reviewed  Physical Exam   GEN: No acute distress.   Neck: No JVD Cardiac: RRR, + 3/6 systolic murmur  Respiratory:  Clear to auscultation bilaterally. GI: Soft, nontender, non-distended  MS: No edema; No deformity. Neuro:  Nonfocal  Psych: Normal affect   Labs    Chemistry Recent Labs  Lab 04/07/18 0209 04/08/18 0731 04/09/18 0318  NA 140 141 138  K 5.2* 5.3* 5.5*  CL 109 109 109  CO2 24 23 23   GLUCOSE 137* 166* 140*  BUN 32* 28* 28*  CREATININE 1.83* 1.71* 1.73*  CALCIUM 9.0 9.1 9.0  GFRNONAA 29* 32* 31*  GFRAA 34* 37* 36*  ANIONGAP 7 9 6      Hematology Recent Labs  Lab 04/07/18 0209 04/08/18 0731 04/09/18 0318  WBC 8.7 7.2 8.4  RBC 3.99 4.66 3.91  HGB 11.9* 13.9 11.7*  HCT 36.2 43.2 36.0  MCV 90.7 92.7 92.1  MCH 29.8 29.8 29.9  MCHC 32.9 32.2 32.5  RDW 12.8 12.9 13.3  PLT 127* 135* 138*    Cardiac Enzymes Recent Labs  Lab 04/04/18 2313 04/05/18 0114 04/05/18 0647 04/05/18 1305  TROPONINI 0.05* 0.08* 0.20* 0.23*   No results for input(s): TROPIPOC in the last 168 hours.   BNPNo results for input(s): BNP,  PROBNP in the last 168 hours.   DDimer No results for input(s): DDIMER in the last 168 hours.   Radiology    No results found.  Cardiac Studies   Left heart cath 04/08/18:  Non-stenotic Ost LM to LM lesion previously treated.  Ost LAD to Prox LAD lesion is 80% stenosed. LIMA to LAD is patent with a proximal 60% lesion.  Non-stenotic Ost Cx to Mid Cx lesion previously treated.  Prox Cx to Mid Cx lesion is 50% stenosed.  Balloon angioplasty was performed.  1st Mrg-1 lesion is 70% stenosed.  Ost 1st Mrg lesion is 80% stenosed.  1st Mrg-2 lesion is 10% stenosed.  Origin to Prox Graft lesion is 100% stenosed. SVG to OM is occluded.  Previously placed Origin drug eluting stent is widely patent.  Balloon angioplasty was performed.  Mid RCA lesion is 90% stenosed. THis is past the insertion of the graft.  LV end diastolic pressure is mildly elevated.  There is no aortic valve stenosis.  Origin lesion is 60% stenosed.   Recommend dual  antiplatelet therapy with Aspirin 81mg  daily and Clopidogrel 75mg  daily long-term (beyond 12 months) because of extensive CAD, and multiple stents..   Will look into repeat PCI attempt tomorrow if Cr is ok.  Could consider redo CABG eval as well given extensive circ disease and now two lesions in the SVG to RCA in the last 12 months, proximal LIMA lesion.  Will discuss with interventional colleagues.   Echo 04/05/18: Study Conclusions - Left ventricle: The cavity size was normal. Wall thickness was   normal. Systolic function was normal. The estimated ejection   fraction was in the range of 60% to 65%. Wall motion was normal;   there were no regional wall motion abnormalities. Features are   consistent with a pseudonormal left ventricular filling pattern,   with concomitant abnormal relaxation and increased filling   pressure (grade 2 diastolic dysfunction). Doppler parameters are   consistent with high ventricular filling pressure. - Aortic valve: Valve area (VTI): 1.56 cm^2. Valve area (Vmax):   1.63 cm^2. - Mitral valve: There was mild regurgitation. - Left atrium: The atrium was mildly dilated. - Technically adequate study.  Patient Profile     62 y.o. female with a hx of CAD s/p CABG 2005 with numerous PCIs since then (last in 05/2017), CKD stage III, anemia, asthma, HTN, HLD, hypothyroidism, gout, PAD (prior R CEA 2014, was followed by [facility]), SBO s/p prior LOA, thrombocytopenia, DM, bradycardia (not on BB due to this)whowas seen forevaluation of chest pain/elevated troponin, transferred to [facility] for planned cath on Monday, which showed multivessel disease (70-80% stenosis in first marginal and disease in SVR to RCA). Repeat PCI is planned for today.  Assessment & Plan    1. NSTEMI - PCI yesterday with multivessel disease - given renal function, plan for staged PCI today - heparin drip running - no chest pain overnight   2. HTN - pressures have been labile - currently  scheduled for norvasc 10 mg, hydralazine 10 mg TID, isorbide 5 mg, and lopressor 25 mg BID - will continue to monitor and adjust after PCI today   3. CKD stage 3 - sCr today 1.73 - baseline appears to be 1.59-1.74   For questions or updates, please contact [place] Please consult [email or web address] for contact info under Cardiology/STEMI.      Signed, Tami Lin Duke, PA  04/09/2018, 7:42 AM    Personally seen and examined. Agree with above.  No  chest pain currently, no shortness of breath.  Has been experiencing exertional angina however recently.  GEN: Well nourished, well developed, in no acute distress  HEENT: normal  Neck: no JVD, carotid bruits, or masses Cardiac: RRR; 2/6 systolic murmur,no rubs, or gallops,no edema, cath site normal Respiratory:  clear to auscultation bilaterally, normal work of breathing GI: soft, nontender, nondistended, + BS MS: no deformity or atrophy  Skin: warm and dry, no rash Neuro:  Alert and Oriented x 3, Strength and sensation are intact Psych: euthymic mood, full affect   Cardiac catheterization personally reviewed, discussion with Dr. Irish Lack.  Assessment and plan:  Unstable angina/CAD post CABG - We will put her on the add on board for cardiac catheterization for Dr. Irish Lack.  Creatinine is stable today.  Reattempt SVG to RCA PCI.  Technically challenging.  If unsuccessful, will consult T CTS.  Explained to her that the risk to benefit ratio for redo CABG may not be favorable based upon targets.  Essential hypertension -Continue to treat.  Mildly elevated today.  CKD stage III - Baseline creatinine ranging from 1.6-1.8-creatinine currently 1.73.  Candee Furbish, MD

## 2018-04-09 NOTE — H&P (View-Only) (Signed)
Progress Note  Patient Name: Donna Howe Date of Encounter: 04/09/2018  Primary Cardiologist: Rozann Lesches, MD   Subjective   Pt denies chest pain, feels well this morning.   Inpatient Medications    Scheduled Meds: . amLODipine  10 mg Oral Daily  . aspirin EC  81 mg Oral Daily  . atorvastatin  80 mg Oral Daily  . clopidogrel  75 mg Oral Daily  . colchicine  0.6 mg Oral Daily  . gabapentin  400 mg Oral QHS  . hydrALAZINE  10 mg Oral Q8H  . insulin aspart  0-5 Units Subcutaneous QHS  . insulin aspart  0-9 Units Subcutaneous TID WC  . isosorbide dinitrate  5 mg Oral TID  . levothyroxine  125 mcg Oral BH-q7a  . metoprolol tartrate  25 mg Oral BID  . sodium chloride flush  3 mL Intravenous Q12H  . sodium chloride flush  3 mL Intravenous Q12H   Continuous Infusions: . sodium chloride    . sodium chloride    . heparin 1,350 Units/hr (04/09/18 0546)   PRN Meds: sodium chloride, sodium chloride, acetaminophen, ALPRAZolam, metoprolol tartrate, morphine injection, ondansetron (ZOFRAN) IV, sodium chloride flush, sodium chloride flush   Vital Signs    Vitals:   04/09/18 0610 04/09/18 0619 04/09/18 0700 04/09/18 0721  BP: (!) 180/69  (!) 162/58 (!) 158/58  Pulse: (!) 56  (!) 51 (!) 48  Resp: (!) 21 (!) 27 (!) 23 18  Temp: 98.2 F (36.8 C)   98.3 F (36.8 C)  TempSrc:      SpO2: 98%  97% 100%  Weight:  200 lb 14.4 oz (91.1 kg)    Height:        Intake/Output Summary (Last 24 hours) at 04/09/2018 0742 Last data filed at 04/09/2018 0622 Gross per 24 hour  Intake 1068.27 ml  Output 1600 ml  Net -531.73 ml   Filed Weights   04/07/18 0623 04/08/18 0541 04/09/18 0619  Weight: 202 lb (91.6 kg) 202 lb 11.2 oz (91.9 kg) 200 lb 14.4 oz (91.1 kg)    Telemetry    Sinus brady at 50 bpm - Personally Reviewed  ECG    No new tracings - Personally Reviewed  Physical Exam   GEN: No acute distress.   Neck: No JVD Cardiac: RRR, + 3/6 systolic murmur  Respiratory:  Clear to auscultation bilaterally. GI: Soft, nontender, non-distended  MS: No edema; No deformity. Neuro:  Nonfocal  Psych: Normal affect   Labs    Chemistry Recent Labs  Lab 04/07/18 0209 04/08/18 0731 04/09/18 0318  NA 140 141 138  K 5.2* 5.3* 5.5*  CL 109 109 109  CO2 24 23 23   GLUCOSE 137* 166* 140*  BUN 32* 28* 28*  CREATININE 1.83* 1.71* 1.73*  CALCIUM 9.0 9.1 9.0  GFRNONAA 29* 32* 31*  GFRAA 34* 37* 36*  ANIONGAP 7 9 6      Hematology Recent Labs  Lab 04/07/18 0209 04/08/18 0731 04/09/18 0318  WBC 8.7 7.2 8.4  RBC 3.99 4.66 3.91  HGB 11.9* 13.9 11.7*  HCT 36.2 43.2 36.0  MCV 90.7 92.7 92.1  MCH 29.8 29.8 29.9  MCHC 32.9 32.2 32.5  RDW 12.8 12.9 13.3  PLT 127* 135* 138*    Cardiac Enzymes Recent Labs  Lab 04/04/18 2313 04/05/18 0114 04/05/18 0647 04/05/18 1305  TROPONINI 0.05* 0.08* 0.20* 0.23*   No results for input(s): TROPIPOC in the last 168 hours.   BNPNo results for input(s): BNP,  PROBNP in the last 168 hours.   DDimer No results for input(s): DDIMER in the last 168 hours.   Radiology    No results found.  Cardiac Studies   Left heart cath 04/08/18:  Non-stenotic Ost LM to LM lesion previously treated.  Ost LAD to Prox LAD lesion is 80% stenosed. LIMA to LAD is patent with a proximal 60% lesion.  Non-stenotic Ost Cx to Mid Cx lesion previously treated.  Prox Cx to Mid Cx lesion is 50% stenosed.  Balloon angioplasty was performed.  1st Mrg-1 lesion is 70% stenosed.  Ost 1st Mrg lesion is 80% stenosed.  1st Mrg-2 lesion is 10% stenosed.  Origin to Prox Graft lesion is 100% stenosed. SVG to OM is occluded.  Previously placed Origin drug eluting stent is widely patent.  Balloon angioplasty was performed.  Mid RCA lesion is 90% stenosed. THis is past the insertion of the graft.  LV end diastolic pressure is mildly elevated.  There is no aortic valve stenosis.  Origin lesion is 60% stenosed.   Recommend dual  antiplatelet therapy with Aspirin 81mg  daily and Clopidogrel 75mg  daily long-term (beyond 12 months) because of extensive CAD, and multiple stents..   Will look into repeat PCI attempt tomorrow if Cr is ok.  Could consider redo CABG eval as well given extensive circ disease and now two lesions in the SVG to RCA in the last 12 months, proximal LIMA lesion.  Will discuss with interventional colleagues.   Echo 04/05/18: Study Conclusions - Left ventricle: The cavity size was normal. Wall thickness was   normal. Systolic function was normal. The estimated ejection   fraction was in the range of 60% to 65%. Wall motion was normal;   there were no regional wall motion abnormalities. Features are   consistent with a pseudonormal left ventricular filling pattern,   with concomitant abnormal relaxation and increased filling   pressure (grade 2 diastolic dysfunction). Doppler parameters are   consistent with high ventricular filling pressure. - Aortic valve: Valve area (VTI): 1.56 cm^2. Valve area (Vmax):   1.63 cm^2. - Mitral valve: There was mild regurgitation. - Left atrium: The atrium was mildly dilated. - Technically adequate study.  Patient Profile     62 y.o. female with a hx of CAD s/p CABG 2005 with numerous PCIs since then (last in 05/2017), CKD stage III, anemia, asthma, HTN, HLD, hypothyroidism, gout, PAD (prior R CEA 2014, was followed by [facility]), SBO s/p prior LOA, thrombocytopenia, DM, bradycardia (not on BB due to this)whowas seen forevaluation of chest pain/elevated troponin, transferred to [facility] for planned cath on Monday, which showed multivessel disease (70-80% stenosis in first marginal and disease in SVR to RCA). Repeat PCI is planned for today.  Assessment & Plan    1. NSTEMI - PCI yesterday with multivessel disease - given renal function, plan for staged PCI today - heparin drip running - no chest pain overnight   2. HTN - pressures have been labile - currently  scheduled for norvasc 10 mg, hydralazine 10 mg TID, isorbide 5 mg, and lopressor 25 mg BID - will continue to monitor and adjust after PCI today   3. CKD stage 3 - sCr today 1.73 - baseline appears to be 1.59-1.74   For questions or updates, please contact [place] Please consult [email or web address] for contact info under Cardiology/STEMI.      Signed, Tami Lin Duke, PA  04/09/2018, 7:42 AM    Personally seen and examined. Agree with above.  No  chest pain currently, no shortness of breath.  Has been experiencing exertional angina however recently.  GEN: Well nourished, well developed, in no acute distress  HEENT: normal  Neck: no JVD, carotid bruits, or masses Cardiac: RRR; 2/6 systolic murmur,no rubs, or gallops,no edema, cath site normal Respiratory:  clear to auscultation bilaterally, normal work of breathing GI: soft, nontender, nondistended, + BS MS: no deformity or atrophy  Skin: warm and dry, no rash Neuro:  Alert and Oriented x 3, Strength and sensation are intact Psych: euthymic mood, full affect   Cardiac catheterization personally reviewed, discussion with Dr. Irish Lack.  Assessment and plan:  Unstable angina/CAD post CABG - We will put her on the add on board for cardiac catheterization for Dr. Irish Lack.  Creatinine is stable today.  Reattempt SVG to RCA PCI.  Technically challenging.  If unsuccessful, will consult T CTS.  Explained to her that the risk to benefit ratio for redo CABG may not be favorable based upon targets.  Essential hypertension -Continue to treat.  Mildly elevated today.  CKD stage III - Baseline creatinine ranging from 1.6-1.8-creatinine currently 1.73.  Candee Furbish, MD

## 2018-04-09 NOTE — Progress Notes (Signed)
[place] for Heparin Indication: chest pain/ACS  Allergies  Allergen Reactions  . Penicillins Other (See Comments)    REACTION: Unknown, told as a child Has patient had a PCN reaction causing immediate rash, facial/tongue/throat swelling, SOB or lightheadedness with hypotension: Unknown Has patient had a PCN reaction causing severe rash involving mucus membranes or skin necrosis: Unknown Has patient had a PCN reaction that required hospitalization: Unknown Has patient had a PCN reaction occurring within the last 10 years: No If all of the above answers are "NO", then may proceed with Cephalosporin use.     Patient Measurements: Height: 5\' 5"  (165.1 cm) Weight: 200 lb 14.4 oz (91.1 kg) IBW/kg (Calculated) : 57 Heparin Dosing Weight: 77.1 kg  Vital Signs: Temp: 98.3 F (36.8 C) (07/23 0721) Temp Source: Oral (07/23 0721) BP: 158/58 (07/23 0721) Pulse Rate: 48 (07/23 0721)  Labs: Recent Labs    04/07/18 0209 04/07/18 1150 04/08/18 0731 04/09/18 0318 04/09/18 0832  HGB 11.9*  --  13.9 11.7*  --   HCT 36.2  --  43.2 36.0  --   PLT 127*  --  135* 138*  --   HEPARINUNFRC 0.43 0.40 0.55  --  0.31  CREATININE 1.83*  --  1.71* 1.73*  --     Estimated Creatinine Clearance: 39 mL/min (A) (by C-G formula based on SCr of 1.73 mg/dL (H)).   Medications:  Medications Prior to Admission  Medication Sig Dispense Refill Last Dose  . ALPRAZolam (XANAX) 0.5 MG tablet Take 1/2 tab by mouth every morning & evening and 1 tab at bedtime   02/16/2018  . aspirin EC 81 MG tablet Take 81 mg by mouth daily.   04/03/2018 at Unknown time  . atorvastatin (LIPITOR) 80 MG tablet Take 1 tablet (80 mg total) by mouth daily. 30 tablet 0 04/02/2018  . clopidogrel (PLAVIX) 75 MG tablet Take 1 tablet (75 mg total) by mouth daily. 30 tablet 11 04/03/2018 at 0900  . colchicine 0.6 MG tablet Take 0.6 mg by mouth daily.    04/03/2018  . gabapentin (NEURONTIN) 400 MG capsule  Take 400 mg by mouth at bedtime.    04/02/2018  . levothyroxine (SYNTHROID, LEVOTHROID) 125 MCG tablet Take 125 mcg by mouth every morning.   04/05/2018 at Unknown time  . nitroGLYCERIN (NITROSTAT) 0.4 MG SL tablet Place 0.4 mg under the tongue every 5 (five) minutes as needed for chest pain. Not to exceed 3 in 15 minute time frame   04/03/2018  . NOVOLIN 70/30 RELION (70-30) 100 UNIT/ML injection Inject 20 Units into the skin 2 (two) times daily with a meal.    04/03/2018  . ondansetron (ZOFRAN ODT) 8 MG disintegrating tablet Take 1 tablet (8 mg total) by mouth every 8 (eight) hours as needed for nausea or vomiting. 20 tablet 0 unknown  . lisinopril (PRINIVIL,ZESTRIL) 40 MG tablet Take 40 mg by mouth daily.   04/03/2018    Assessment: 62 yo female s/p cardiac cath 7/22, plan is for PCI today. Pharmacy was consulted to resume heparin on 7/22 - 8 hours post sheath removal. Patient previously at upper end of therapeutic on heparin 1400 units/hr  Heparin level today therapeutic at 0.31, CBC stable. Hematoma noted by RN on 7/22, per RN today hematoma has improved.  Goal of Therapy:  Heparin level 0.3-0.7 units/ml Monitor platelets by anticoagulation protocol: Yes  Plan:   Continue heparin gtt at 1350 units/hr  Monitor daily heparin level, CBC, s/sx of  bleeding F/u on plans post-PCI today   Thank you for involving pharmacy in this patient's care.  Janae Bridgeman, PharmD PGY1 Pharmacy Resident Phone: [phone] 04/09/2018 10:21 AM

## 2018-04-10 ENCOUNTER — Encounter (HOSPITAL_COMMUNITY): Payer: Self-pay | Admitting: Interventional Cardiology

## 2018-04-10 DIAGNOSIS — Z95820 Peripheral vascular angioplasty status with implants and grafts: Secondary | ICD-10-CM

## 2018-04-10 HISTORY — DX: Peripheral vascular angioplasty status with implants and grafts: Z95.820

## 2018-04-10 LAB — BASIC METABOLIC PANEL
Anion gap: 8 (ref 5–15)
BUN: 29 mg/dL — AB (ref 6–20)
CALCIUM: 9.1 mg/dL (ref 8.9–10.3)
CO2: 23 mmol/L (ref 22–32)
Chloride: 110 mmol/L (ref 98–111)
Creatinine, Ser: 1.77 mg/dL — ABNORMAL HIGH (ref 0.44–1.00)
GFR calc Af Amer: 35 mL/min — ABNORMAL LOW (ref >60)
GFR, EST NON AFRICAN AMERICAN: 30 mL/min — AB (ref >60)
GLUCOSE: 163 mg/dL — AB (ref 70–99)
Potassium: 4.7 mmol/L (ref 3.5–5.1)
Sodium: 141 mmol/L (ref 135–145)

## 2018-04-10 LAB — GLUCOSE, CAPILLARY
GLUCOSE-CAPILLARY: 144 mg/dL — AB (ref 70–99)
Glucose-Capillary: 143 mg/dL — ABNORMAL HIGH (ref 70–99)

## 2018-04-10 MED ORDER — ISOSORBIDE DINITRATE 5 MG PO TABS: 5.0000 mg | ORAL_TABLET | Freq: Three times a day (TID) | ORAL | 6 refills | Status: DC

## 2018-04-10 MED ORDER — HYDRALAZINE HCL 10 MG PO TABS: 10.0000 mg | ORAL_TABLET | Freq: Three times a day (TID) | ORAL | 6 refills | Status: DC

## 2018-04-10 MED ORDER — TICAGRELOR 90 MG PO TABS: 90.0000 mg | ORAL_TABLET | Freq: Two times a day (BID) | ORAL | 10 refills | Status: DC

## 2018-04-10 MED ORDER — HEART ATTACK BOUNCING BOOK
Freq: Once | Status: AC
  Administered 2018-04-10: 06:00:00
  Filled 2018-04-10: qty 1

## 2018-04-10 MED ORDER — TICAGRELOR 90 MG PO TABS: 90.0000 mg | ORAL_TABLET | Freq: Two times a day (BID) | ORAL | 0 refills | Status: DC

## 2018-04-10 MED ORDER — METOPROLOL TARTRATE 25 MG PO TABS: 25.0000 mg | ORAL_TABLET | Freq: Two times a day (BID) | ORAL | 6 refills | Status: DC

## 2018-04-10 MED ORDER — AMLODIPINE BESYLATE 10 MG PO TABS: 10.0000 mg | ORAL_TABLET | Freq: Every day | ORAL | 6 refills | Status: DC

## 2018-04-10 NOTE — Care Management Important Message (Signed)
Important Message  Patient Details  Name: Donna Howe MRN: [id number] Date of Birth: 04/25/1956   Medicare Important Message Given:  Yes    [name] 04/10/2018, 4:27 PM

## 2018-04-10 NOTE — Consult Note (Signed)
            [facility] Primary Care Navigator  04/10/2018  Donna Howe [birth year] [id number]   Went to see patientat the bedsideto identify possible discharge needs.  Patientreports having "chest pains radiating to back and left arm" that had led to this admission. (acute coronary syndrome status post angioplasty with stent)  Patient endorses Dr. Rory Percy with [facility] as her primary care provider.   Patient reports using [facility] in [place], [place]. to obtain medications without difficulty so far.   Patient managesherownmedications at [facility] use of "pill box" system filled weekly.  Patient statesthat she has been driving prior to admission but son Larkin Ina) or daughter in-law Olin Hauser) can providetransportation to herdoctors'appointments if needed after discharge.  Patient verbalized that she lives alone and independent with self care. Her son will be her primary caregiver at home when needed.   Anticipateddischarge planishomeaccording to patient.Patient mentioned that she will either stay at her son's house after discharge, or son will be staying at her house to assist with her needs.  Patientvoiced understanding to call primary care provider's office whenshereturnshometo schedule a post discharge follow-up visit within1- 2weeksor sooner if needs arise.Patient letter was provided asareminder.   Discussed with patientaboutTHN CM services available for health management at home butshe deniesany current needs or concernsat this point andhaddeclinedTHN-CM services offered, including EMMI calls to follow-upwithherrecovery. Patient states that she is capable and " able handle it" herself. She is aware and knowledgeable of ways to manage diabetes and verbalized "it's just a matter of getting it done".  Encouraged patientthat if she changes her mind, toseekreferral from primary care provider to [facility]  care management if deemed necessaryand appropriate for any services in the nearfuture.  [facility] care management information was provided for future needs thatshe may have.   For additional questions please contact:  Edwena Felty A. Ashtyn Meland, BSN, RN-BC [facility] PRIMARY CARE Navigator Cell: [phone]

## 2018-04-10 NOTE — Care Management Note (Addendum)
Case Management Note  Patient Details  Name: Donna Howe MRN: [id number] Date of Birth: [birth year]  Subjective/Objective:    From home, s/p stent intervention, will be on brilinta, RN Nere gave patient the 30 day savings coupon.  Her pharmacy has in stock.   Informed patient of this information.             Action/Plan: DC home when ready.   Expected Discharge Date:  04/10/18               Expected Discharge Plan:  Home/Self Care  In-House Referral:     Discharge planning Services  CM Consult, Medication Assistance  Post Acute Care Choice:    Choice offered to:     DME Arranged:    DME Agency:     HH Arranged:    HH Agency:     Status of Service:  Completed, signed off  If discussed at [employer] of Stay Meetings, dates discussed:    Additional Comments:  Zenon Mayo, RN 04/10/2018, 11:45 AM

## 2018-04-10 NOTE — Progress Notes (Signed)
#   3. S/W CHRISTINE @ [employer] [facility] # [phone] OPT- 2    BRILINTA  90 MG BID  COVER- YES  CO-PAY- $ 8.50  TIER- NO  PRIOR APPROVAL- NO   PREFERRED PHARMACY : YES  [facility]

## 2018-04-10 NOTE — Discharge Summary (Addendum)
Discharge Summary    Patient ID: Donna Howe,  MRN: [id number], DOB/AGE: [birth year] 62 y.o.  Admit date: 04/04/2018 Discharge date: 04/10/2018  Primary Care Provider: Rory Percy Primary Cardiologist: Rozann Lesches, MD  Discharge Diagnoses    Principal Problem:   ACS (acute coronary syndrome) [facility]) Active Problems:   S/P angioplasty with stent- DES to [facility] and to LIMA to LAD with DES 04/09/18.     Hyperlipidemia   Obesity (BMI 30-39.9)   Essential hypertension   Gout   Chronic kidney disease (CKD), stage III (moderate) (HCC)   CAD (coronary artery disease)   Hx of CABG   Anemia   Diabetes (HCC)   History of coronary artery disease   Angina pectoris (HCC)   Allergies Allergies  Allergen Reactions  . Penicillins Other (See Comments)    REACTION: Unknown, told as a child Has patient had a PCN reaction causing immediate rash, facial/tongue/throat swelling, SOB or lightheadedness with hypotension: Unknown Has patient had a PCN reaction causing severe rash involving mucus membranes or skin necrosis: Unknown Has patient had a PCN reaction that required hospitalization: Unknown Has patient had a PCN reaction occurring within the last 10 years: No If all of the above answers are "NO", then may proceed with Cephalosporin use.     Diagnostic Studies/Procedures     Cardiac cath 04/08/18 and PCI 04/09/18   Non-stenotic Ost LM to LM lesion previously treated.  Ost LAD to Prox LAD lesion is 80% stenosed.  Non-stenotic Ost Cx to Mid Cx lesion previously treated.  1st Mrg-1 lesion is 70% stenosed.  Ost 1st Mrg lesion is 80% stenosed.  1st Mrg-2 lesion is 10% stenosed.  Prox Cx to Mid Cx lesion is 50% stenosed.  Patent stent in the ostium of the SVG to the RCA  Mid RCA lesion is 90% stenosed.  A drug-eluting stent was successfully placed using a STENT SIERRA 2.50 X 15 MM, through the SVG to RCA graft.  Post intervention, there is a 0% residual  stenosis.  Origin lesion is 70% stenosed of the LIMA to LAD.  A drug-eluting stent was successfully placed using a STENT SYNERGY DES 3X12, postdilated to 3.25 mm.  Post intervention, there is a 0% residual stenosis.   Recommend uninterrupted dual antiplatelet therapy with Aspirin 81mg  daily and Ticagrelor 90mg  twice dailyfor a minimum of 12 months (ACS - Class I recommendation).  Continue aggressive secondary prevention.   Diagnostic Diagram       Post-Intervention Diagram         ECHO 04/05/18  Study Conclusions  - Left ventricle: The cavity size was normal. Wall thickness was   normal. Systolic function was normal. The estimated ejection   fraction was in the range of 60% to 65%. Wall motion was normal;   there were no regional wall motion abnormalities. Features are   consistent with a pseudonormal left ventricular filling pattern,   with concomitant abnormal relaxation and increased filling   pressure (grade 2 diastolic dysfunction). Doppler parameters are   consistent with high ventricular filling pressure. - Aortic valve: Valve area (VTI): 1.56 cm^2. Valve area (Vmax):   1.63 cm^2. - Mitral valve: There was mild regurgitation. - Left atrium: The atrium was mildly dilated. - Technically adequate study.   _____________   History of Present Illness     62 yo F with hx of CAD and prior CABG 2005 and numerous PCIs since that time, last PCI in 2018, CKD -3, anemai, asthma, HTN,  HLD, hypothyroidism, gout, PAD (prior R CEA 2014 was followed by [facility]) SBO s.p prior LOA, thrombocytopenia, DM, bradycardia and transferred from [facility] to [facility] for elevated troponin and cardiac cath.  She was placed on IV heparin troponin pk 0.23.  Lopressor was started with HR to 46.     Hospital Course     Consultants: none   Pt was given IV fluids for 12 hours prior to cath.  We also stopped ARB and added hydralazine  isordil to control BP.     Cardiac cath 04/08/18 with 2 lesions  in VG to RCA, proximal LIMA lesion.  thought was for PCI vs. Re-do CABG.  Dr. Marlou Porch and Dr. Irish Lack discussed and plan was to proceed with PCI to VG to RCA, with thought if unsuccessful then need for TCTS consult to eval for possible re-do bypass.   04/09/18 she underwent DES to [facility] and LIMA to LAD stenosis with DES.      Today pt seen and examined by Dr. Marlou Porch she was without pain or SOB and found stable for discharge.  She will need DAPT for 12 months.  Discharged on ASA and Brilinta - she is concerned about cost.  She will call the office if she has problems in obtaining medication.   She walked with cardiac rehab.  She has been referred to phase 2 in [place].  We will not resume lisinopril.  Cr. Today 1.77 _____________  Discharge Vitals Blood pressure (!) 142/49, pulse (!) 56, temperature 97.9 F (36.6 C), resp. rate 16, height 5\' 5"  (1.651 m), weight 198 lb (89.8 kg), SpO2 100 %.  Filed Weights   04/08/18 0541 04/09/18 0619 04/10/18 0330  Weight: 202 lb 11.2 oz (91.9 kg) 200 lb 14.4 oz (91.1 kg) 198 lb (89.8 kg)    Labs & Radiologic Studies    CBC Recent Labs    04/08/18 0731 04/09/18 0318  WBC 7.2 8.4  HGB 13.9 11.7*  HCT 43.2 36.0  MCV 92.7 92.1  PLT 135* 742*   Basic Metabolic Panel Recent Labs    04/09/18 0318 04/10/18 0407  NA 138 141  K 5.5* 4.7  CL 109 110  CO2 23 23  GLUCOSE 140* 163*  BUN 28* 29*  CREATININE 1.73* 1.77*  CALCIUM 9.0 9.1   Liver Function Tests No results for input(s): AST, ALT, ALKPHOS, BILITOT, PROT, ALBUMIN in the last 72 hours. No results for input(s): LIPASE, AMYLASE in the last 72 hours. Cardiac Enzymes No results for input(s): CKTOTAL, CKMB, CKMBINDEX, TROPONINI in the last 72 hours. BNP Invalid input(s): POCBNP D-Dimer No results for input(s): DDIMER in the last 72 hours. Hemoglobin A1C No results for input(s): HGBA1C in the last 72 hours. Fasting Lipid Panel No results for input(s): CHOL, HDL, LDLCALC, TRIG, CHOLHDL,  LDLDIRECT in the last 72 hours. Thyroid Function Tests No results for input(s): TSH, T4TOTAL, T3FREE, THYROIDAB in the last 72 hours.  Invalid input(s): FREET3 _____________  Dg Chest 2 View  Result Date: 04/04/2018 CLINICAL DATA:  Chest pain EXAM: CHEST - 2 VIEW COMPARISON:  05/30/2017 FINDINGS: Mild cardiomegaly. Remote median sternotomy. Lungs are clear. No pleural effusion or pneumothorax. IMPRESSION: Mild cardiomegaly without active airspace disease. Electronically Signed   By: Ulyses Jarred M.D.   On: 04/04/2018 22:10   Disposition   Pt is being discharged home today in good condition.  Follow-up Plans & Appointments    Call [facility] at [phone] if any bleeding, swelling or drainage at cath  site.  May shower, no tub baths for 48 hours for groin sticks. No lifting over 5 pounds for 5 days.  No Driving for 5 days  Heart Healthy Diabetic Diet.  Do not stop asprin and Brilinta.  If you have problems obtaining Brilinta call the office for samples.    Do not stop asprin and Brilinta.  If you have problems obtaining Brilinta call the office for samples.    We stopped your lisinopril due to your kidney function.  We added hydralazine and isordil.      Follow-up Information    Satira Sark, MD Follow up on 05/03/2018.   Specialty:  Cardiology Why:  at 2:30 pm with his PA [place] information: Tracy [place] [phone]          Discharge Instructions    AMB Referral to Cardiac Rehabilitation - Phase II   Complete by:  As directed    Diagnosis:   NSTEMI Coronary Stents     Amb Referral to Cardiac Rehabilitation   Complete by:  As directed    Diagnosis:   NSTEMI Coronary Stents        Discharge Medications   Allergies as of 04/10/2018      Reactions   Penicillins Other (See Comments)   REACTION: Unknown, told as a child Has patient had a PCN reaction causing immediate rash,  facial/tongue/throat swelling, SOB or lightheadedness with hypotension: Unknown Has patient had a PCN reaction causing severe rash involving mucus membranes or skin necrosis: Unknown Has patient had a PCN reaction that required hospitalization: Unknown Has patient had a PCN reaction occurring within the last 10 years: No If all of the above answers are "NO", then may proceed with Cephalosporin use.      Medication List    STOP taking these medications   clopidogrel 75 MG tablet Commonly known as:  PLAVIX   lisinopril 40 MG tablet Commonly known as:  PRINIVIL,ZESTRIL     TAKE these medications   ALPRAZolam 0.5 MG tablet Commonly known as:  XANAX Take 1/2 tab by mouth every morning & evening and 1 tab at bedtime   amLODipine 10 MG tablet Commonly known as:  NORVASC Take 1 tablet (10 mg total) by mouth daily.   aspirin EC 81 MG tablet Take 81 mg by mouth daily.   atorvastatin 80 MG tablet Commonly known as:  LIPITOR Take 1 tablet (80 mg total) by mouth daily.   colchicine 0.6 MG tablet Take 0.6 mg by mouth daily.   gabapentin 400 MG capsule Commonly known as:  NEURONTIN Take 400 mg by mouth at bedtime.   hydrALAZINE 10 MG tablet Commonly known as:  APRESOLINE Take 1 tablet (10 mg total) by mouth every 8 (eight) hours.   isosorbide dinitrate 5 MG tablet Commonly known as:  ISORDIL Take 1 tablet (5 mg total) by mouth 3 (three) times daily.   levothyroxine 125 MCG tablet Commonly known as:  SYNTHROID, LEVOTHROID Take 125 mcg by mouth every morning.   metoprolol tartrate 25 MG tablet Commonly known as:  LOPRESSOR Take 1 tablet (25 mg total) by mouth 2 (two) times daily.   nitroGLYCERIN 0.4 MG SL tablet Commonly known as:  NITROSTAT Place 0.4 mg under the tongue every 5 (five) minutes as needed for chest pain. Not to exceed 3 in 15 minute time frame   NOVOLIN 70/30 RELION (70-30) 100 UNIT/ML injection Generic drug:  insulin NPH-regular Human Inject 20 Units into  the  skin 2 (two) times daily with a meal.   ondansetron 8 MG disintegrating tablet Commonly known as:  ZOFRAN ODT Take 1 tablet (8 mg total) by mouth every 8 (eight) hours as needed for nausea or vomiting.   ticagrelor 90 MG Tabs tablet Commonly known as:  BRILINTA Take 1 tablet (90 mg total) by mouth 2 (two) times daily.        Acute coronary syndrome (MI, NSTEMI, STEMI, etc) this admission?:  NSTEMI On Brilinta and asprin, no ACE or ARB due to renal insuff. + to go to cardiac rehab  And echo was done and EF normal.  No need for aldactone.  Is on high dose statin.   Outstanding Labs/Studies   BMP  Duration of Discharge Encounter   Greater than 30 minutes including physician time.  Signed, Cecilie Kicks, NP 04/10/2018, 11:20 AM   Primary Cardiologist: Rozann Lesches, MD   Subjective   Feels well, no chest pain, no shortness of breath.  Repeat cardiac catheterization went well.  Inpatient Medications    Scheduled Meds: . amLODipine  10 mg Oral Daily  . aspirin EC  81 mg Oral Daily  . atorvastatin  80 mg Oral Daily  . colchicine  0.6 mg Oral Daily  . gabapentin  400 mg Oral QHS  . heparin  5,000 Units Subcutaneous Q8H  . hydrALAZINE  10 mg Oral Q8H  . insulin aspart  0-5 Units Subcutaneous QHS  . insulin aspart  0-9 Units Subcutaneous TID WC  . isosorbide dinitrate  5 mg Oral TID  . levothyroxine  125 mcg Oral BH-q7a  . metoprolol tartrate  25 mg Oral BID  . sodium chloride flush  3 mL Intravenous Q12H  . sodium chloride flush  3 mL Intravenous Q12H  . sodium chloride flush  3 mL Intravenous Q12H  . ticagrelor  90 mg Oral BID   Continuous Infusions: . sodium chloride    . sodium chloride    . sodium chloride     PRN Meds: sodium chloride, sodium chloride, sodium chloride, acetaminophen, ALPRAZolam, metoprolol tartrate, morphine injection, ondansetron (ZOFRAN) IV, sodium chloride flush, sodium chloride flush, sodium chloride flush   Vital Signs           Vitals:   04/09/18 2232 04/09/18 2237 04/10/18 0330 04/10/18 0800  BP:   (!) 145/57 (!) 142/49  Pulse:   69 (!) 56  Resp:   18 16  Temp:   98.2 F (36.8 C) 97.9 F (36.6 C)  TempSrc:   Oral   SpO2: 99% 100% 100% 100%  Weight:   198 lb (89.8 kg)   Height:        Intake/Output Summary (Last 24 hours) at 04/10/2018 0817 Last data filed at 04/10/2018 0330    Gross per 24 hour  Intake 1380.5 ml  Output 1650 ml  Net -269.5 ml        Filed Weights   04/08/18 0541 04/09/18 0619 04/10/18 0330  Weight: 202 lb 11.2 oz (91.9 kg) 200 lb 14.4 oz (91.1 kg) 198 lb (89.8 kg)    Telemetry    No adverse arrhythmias- Personally Reviewed  ECG    Normal sinus rhythm with nonspecific ST-T wave changes- Personally Reviewed  Physical Exam   GEN:No acute distress.   Neck:No JVD Cardiac:RRR, no murmurs, rubs, or gallops.  Right and left groin post cath stable Respiratory:Clear to auscultation bilaterally. HY:WVPX, nontender, non-distended  MS:No edema; No deformity. Neuro:Nonfocal  Psych: Normal affect   Labs  Chemistry LastLabs       Recent Labs  Lab 04/08/18 0731 04/09/18 0318 04/10/18 0407  NA 141 138 141  K 5.3* 5.5* 4.7  CL 109 109 110  CO2 23 23 23   GLUCOSE 166* 140* 163*  BUN 28* 28* 29*  CREATININE 1.71* 1.73* 1.77*  CALCIUM 9.1 9.0 9.1  GFRNONAA 32* 31* 30*  GFRAA 37* 36* 35*  ANIONGAP 9 6 8        Hematology LastLabs       Recent Labs  Lab 04/07/18 0209 04/08/18 0731 04/09/18 0318  WBC 8.7 7.2 8.4  RBC 3.99 4.66 3.91  HGB 11.9* 13.9 11.7*  HCT 36.2 43.2 36.0  MCV 90.7 92.7 92.1  MCH 29.8 29.8 29.9  MCHC 32.9 32.2 32.5  RDW 12.8 12.9 13.3  PLT 127* 135* 138*      Cardiac Enzymes LastLabs        Recent Labs  Lab 04/04/18 2313 04/05/18 0114 04/05/18 0647 04/05/18 1305  TROPONINI 0.05* 0.08* 0.20* 0.23*      LastLabs  No results for input(s): TROPIPOC in the last 168 hours.      BNP LastLabs  No results for input(s): BNP, PROBNP in the last 168 hours.     DDimer  Elie Confer  No results for input(s): DDIMER in the last 168 hours.     Radiology    ImagingResults(Last48hours)  No results found.    Cardiac Studies    Non-stenotic Ost LM to LM lesion previously treated.  Ost LAD to Prox LAD lesion is 80% stenosed.  Non-stenotic Ost Cx to Mid Cx lesion previously treated.  1st Mrg-1 lesion is 70% stenosed.  Ost 1st Mrg lesion is 80% stenosed.  1st Mrg-2 lesion is 10% stenosed.  Prox Cx to Mid Cx lesion is 50% stenosed.  Patent stent in the ostium of the SVG to the RCA  Mid RCA lesion is 90% stenosed.  A drug-eluting stent was successfully placed using a STENT SIERRA 2.50 X 15 MM, through the SVG to RCA graft.  Post intervention, there is a 0% residual stenosis.  Origin lesion is 70% stenosed of the LIMA to LAD.  A drug-eluting stent was successfully placed using a STENT SYNERGY DES 3X12, postdilated to 3.25 mm.  Post intervention, there is a 0% residual stenosis.   Recommend uninterrupted dual antiplatelet therapy with Aspirin 81mg  daily and Ticagrelor 90mg  twice dailyfor a minimum of 12 months (ACS - Class I recommendation).  Continue aggressive secondary prevention.   Diagnostic Diagram       Post-Intervention Diagram          Patient Profile     62 y.o. female with successful stent placement through SVG to RCA graft to mid RCA lesion, as well as successful stent to LIMA to LAD 70% ostial stenosis.  Assessment & Plan    CAD/NSTEMI  - LIMA and RCA stent (via SVG graft)  - DAPT see above, now Brilinta (worried about cost). High intensity statin  - secondary prevention  HTN  - labile at times, now stable  - Off of lisinopril because of kidney function.  -New medication is amlodipine, hydralazine.  Continue both of these.  Continue metoprolol low-dose as well.  CKD 3  - creat 1.6-1.8, now  1.7 stable. Avoiding ACE-I.  OK for DC after discussing with cardiac rehabilitation. Had hydration.   Follow-up with Dr. Domenic Polite in 2 to 4 weeks.  For questions or updates, please contact [place] Please consult [email or web address] for contact  info under Cardiology/STEMI.      Signed, Candee Furbish, MD

## 2018-04-10 NOTE — Discharge Instructions (Signed)
Call [facility] at [phone] if any bleeding, swelling or drainage at cath site.  May shower, no tub baths for 48 hours for groin sticks. No lifting over 5 pounds for 5 days.  No Driving for 5 days  Heart Healthy Diabetic Diet.  Do not stop asprin and Brilinta.  If you have problems obtaining Brilinta call the office for samples.   We stopped your lisinopril due to your kidney function.  We added hydralazine and isordil.

## 2018-04-10 NOTE — Progress Notes (Signed)
Site area: left groin  Site Prior to Removal:  Level 0  Pressure Applied For 20 MINUTES    Minutes Beginning at 2217  Manual:   Yes.    Patient Status During Pull:  WNL  Post Pull Groin Site:  Level 0  Post Pull Instructions Given:  Yes.    Post Pull Pulses Present:  Yes.    Dressing Applied:  Yes.    Comments:  Pt tolerated well

## 2018-04-10 NOTE — Progress Notes (Addendum)
Progress Note  Patient Name: Donna Howe Date of Encounter: 04/10/2018  Primary Cardiologist: Rozann Lesches, MD   Subjective   Feels well, no chest pain, no shortness of breath.  Repeat cardiac catheterization went well.  Inpatient Medications    Scheduled Meds: . amLODipine  10 mg Oral Daily  . aspirin EC  81 mg Oral Daily  . atorvastatin  80 mg Oral Daily  . colchicine  0.6 mg Oral Daily  . gabapentin  400 mg Oral QHS  . heparin  5,000 Units Subcutaneous Q8H  . hydrALAZINE  10 mg Oral Q8H  . insulin aspart  0-5 Units Subcutaneous QHS  . insulin aspart  0-9 Units Subcutaneous TID WC  . isosorbide dinitrate  5 mg Oral TID  . levothyroxine  125 mcg Oral BH-q7a  . metoprolol tartrate  25 mg Oral BID  . sodium chloride flush  3 mL Intravenous Q12H  . sodium chloride flush  3 mL Intravenous Q12H  . sodium chloride flush  3 mL Intravenous Q12H  . ticagrelor  90 mg Oral BID   Continuous Infusions: . sodium chloride    . sodium chloride    . sodium chloride     PRN Meds: sodium chloride, sodium chloride, sodium chloride, acetaminophen, ALPRAZolam, metoprolol tartrate, morphine injection, ondansetron (ZOFRAN) IV, sodium chloride flush, sodium chloride flush, sodium chloride flush   Vital Signs    Vitals:   04/09/18 2232 04/09/18 2237 04/10/18 0330 04/10/18 0800  BP:   (!) 145/57 (!) 142/49  Pulse:   69 (!) 56  Resp:   18 16  Temp:   98.2 F (36.8 C) 97.9 F (36.6 C)  TempSrc:   Oral   SpO2: 99% 100% 100% 100%  Weight:   198 lb (89.8 kg)   Height:        Intake/Output Summary (Last 24 hours) at 04/10/2018 0817 Last data filed at 04/10/2018 0330 Gross per 24 hour  Intake 1380.5 ml  Output 1650 ml  Net -269.5 ml   Filed Weights   04/08/18 0541 04/09/18 0619 04/10/18 0330  Weight: 202 lb 11.2 oz (91.9 kg) 200 lb 14.4 oz (91.1 kg) 198 lb (89.8 kg)    Telemetry    No adverse arrhythmias- Personally Reviewed  ECG    Normal sinus rhythm with  nonspecific ST-T wave changes- Personally Reviewed  Physical Exam   GEN: No acute distress.   Neck: No JVD Cardiac: RRR, no murmurs, rubs, or gallops.  Right and left groin post cath stable Respiratory: Clear to auscultation bilaterally. GI: Soft, nontender, non-distended  MS: No edema; No deformity. Neuro:  Nonfocal  Psych: Normal affect   Labs    Chemistry Recent Labs  Lab 04/08/18 0731 04/09/18 0318 04/10/18 0407  NA 141 138 141  K 5.3* 5.5* 4.7  CL 109 109 110  CO2 23 23 23   GLUCOSE 166* 140* 163*  BUN 28* 28* 29*  CREATININE 1.71* 1.73* 1.77*  CALCIUM 9.1 9.0 9.1  GFRNONAA 32* 31* 30*  GFRAA 37* 36* 35*  ANIONGAP 9 6 8      Hematology Recent Labs  Lab 04/07/18 0209 04/08/18 0731 04/09/18 0318  WBC 8.7 7.2 8.4  RBC 3.99 4.66 3.91  HGB 11.9* 13.9 11.7*  HCT 36.2 43.2 36.0  MCV 90.7 92.7 92.1  MCH 29.8 29.8 29.9  MCHC 32.9 32.2 32.5  RDW 12.8 12.9 13.3  PLT 127* 135* 138*    Cardiac Enzymes Recent Labs  Lab 04/04/18 2313 04/05/18 0114  04/05/18 0647 04/05/18 1305  TROPONINI 0.05* 0.08* 0.20* 0.23*   No results for input(s): TROPIPOC in the last 168 hours.   BNPNo results for input(s): BNP, PROBNP in the last 168 hours.   DDimer No results for input(s): DDIMER in the last 168 hours.   Radiology    No results found.  Cardiac Studies    Non-stenotic Ost LM to LM lesion previously treated.  Ost LAD to Prox LAD lesion is 80% stenosed.  Non-stenotic Ost Cx to Mid Cx lesion previously treated.  1st Mrg-1 lesion is 70% stenosed.  Ost 1st Mrg lesion is 80% stenosed.  1st Mrg-2 lesion is 10% stenosed.  Prox Cx to Mid Cx lesion is 50% stenosed.  Patent stent in the ostium of the SVG to the RCA  Mid RCA lesion is 90% stenosed.  A drug-eluting stent was successfully placed using a STENT SIERRA 2.50 X 15 MM, through the SVG to RCA graft.  Post intervention, there is a 0% residual stenosis.  Origin lesion is 70% stenosed of the LIMA to  LAD.  A drug-eluting stent was successfully placed using a STENT SYNERGY DES 3X12, postdilated to 3.25 mm.  Post intervention, there is a 0% residual stenosis.    Recommend uninterrupted dual antiplatelet therapy with Aspirin 81mg  daily and Ticagrelor 90mg  twice daily for a minimum of 12 months (ACS - Class I recommendation).   Continue aggressive secondary prevention.    Diagnostic Diagram       Post-Intervention Diagram          Patient Profile     62 y.o. female with successful stent placement through SVG to RCA graft to mid RCA lesion, as well as successful stent to LIMA to LAD 70% ostial stenosis.  Assessment & Plan    CAD/NSTEMI  - LIMA and RCA stent (via SVG graft)  - DAPT see above, now Brilinta (worried about cost). High intensity statin  - secondary prevention  HTN  - labile at times, now stable  - Off of lisinopril because of kidney function.  -New medication is amlodipine, hydralazine.  Continue both of these.  Continue metoprolol low-dose as well.  CKD 3  - creat 1.6-1.8, now 1.7 stable. Avoiding ACE-I.  OK for DC after discussing with cardiac rehabilitation. Had hydration.   Follow-up with Dr. Domenic Polite in 2 to 4 weeks.  For questions or updates, please contact [place] Please consult [email or web address] for contact info under Cardiology/STEMI.      Signed, Candee Furbish, MD  04/10/2018, 8:17 AM

## 2018-04-10 NOTE — Progress Notes (Signed)
CARDIAC REHAB PHASE I   PRE:  Rate/Rhythm: 52 SB  BP:  Supine: 194/50  Right arm,   190/54 left  Sitting:   Standing:    SaO2:   MODE:  Ambulation: 800 ft   POST:  Rate/Rhythm: 72 SR  BP:  Supine: 212/49 left arm  Sitting:   Standing:    SaO2: 99%RA 1005-1055 Pt walked 800 ft on RA with steady gait and no CP. Tolerated well except for elevated BP. Notified RN of elevated BP. MI education completed with pt who voiced understanding. Stressed importance of brilinta with stent. Reviewed NTG use, MI restrictions, gave diabetic and heart healthy diets, ex ed and CRP 2. Referred to [place] CRP 2. Pt has lost her mother and boyfriend and her dog recently. Emotional support given.   Graylon Good, RN BSN  04/10/2018 10:50 AM

## 2018-04-18 ENCOUNTER — Emergency Department (HOSPITAL_COMMUNITY): Payer: Medicare HMO

## 2018-04-18 ENCOUNTER — Encounter (HOSPITAL_COMMUNITY): Payer: Self-pay | Admitting: Emergency Medicine

## 2018-04-18 ENCOUNTER — Other Ambulatory Visit: Payer: Self-pay

## 2018-04-18 ENCOUNTER — Observation Stay (HOSPITAL_COMMUNITY)
Admission: EM | Admit: 2018-04-18 | Discharge: 2018-04-20 | Disposition: A | Discharge status: Home / Self Care | Payer: Medicare HMO | Attending: Internal Medicine | Admitting: Internal Medicine

## 2018-04-18 DIAGNOSIS — Z951 Presence of aortocoronary bypass graft: Secondary | ICD-10-CM | POA: Diagnosis not present

## 2018-04-18 DIAGNOSIS — E039 Hypothyroidism, unspecified: Secondary | ICD-10-CM | POA: Diagnosis present

## 2018-04-18 DIAGNOSIS — D649 Anemia, unspecified: Secondary | ICD-10-CM | POA: Diagnosis not present

## 2018-04-18 DIAGNOSIS — R61 Generalized hyperhidrosis: Secondary | ICD-10-CM | POA: Diagnosis not present

## 2018-04-18 DIAGNOSIS — N289 Disorder of kidney and ureter, unspecified: Secondary | ICD-10-CM | POA: Diagnosis not present

## 2018-04-18 DIAGNOSIS — I252 Old myocardial infarction: Secondary | ICD-10-CM | POA: Diagnosis not present

## 2018-04-18 DIAGNOSIS — Z87891 Personal history of nicotine dependence: Secondary | ICD-10-CM | POA: Diagnosis not present

## 2018-04-18 DIAGNOSIS — R079 Chest pain, unspecified: Secondary | ICD-10-CM | POA: Diagnosis not present

## 2018-04-18 DIAGNOSIS — I200 Unstable angina: Secondary | ICD-10-CM | POA: Diagnosis present

## 2018-04-18 DIAGNOSIS — E1122 Type 2 diabetes mellitus with diabetic chronic kidney disease: Secondary | ICD-10-CM | POA: Diagnosis not present

## 2018-04-18 DIAGNOSIS — R001 Bradycardia, unspecified: Secondary | ICD-10-CM | POA: Diagnosis present

## 2018-04-18 DIAGNOSIS — I2510 Atherosclerotic heart disease of native coronary artery without angina pectoris: Secondary | ICD-10-CM | POA: Diagnosis not present

## 2018-04-18 DIAGNOSIS — Z7982 Long term (current) use of aspirin: Secondary | ICD-10-CM | POA: Diagnosis not present

## 2018-04-18 DIAGNOSIS — N183 Chronic kidney disease, stage 3 (moderate): Secondary | ICD-10-CM | POA: Insufficient documentation

## 2018-04-18 DIAGNOSIS — Z79899 Other long term (current) drug therapy: Secondary | ICD-10-CM | POA: Insufficient documentation

## 2018-04-18 DIAGNOSIS — R5383 Other fatigue: Secondary | ICD-10-CM | POA: Insufficient documentation

## 2018-04-18 DIAGNOSIS — I129 Hypertensive chronic kidney disease with stage 1 through stage 4 chronic kidney disease, or unspecified chronic kidney disease: Secondary | ICD-10-CM | POA: Diagnosis not present

## 2018-04-18 DIAGNOSIS — J45909 Unspecified asthma, uncomplicated: Secondary | ICD-10-CM | POA: Diagnosis not present

## 2018-04-18 DIAGNOSIS — E1129 Type 2 diabetes mellitus with other diabetic kidney complication: Secondary | ICD-10-CM | POA: Diagnosis present

## 2018-04-18 DIAGNOSIS — E782 Mixed hyperlipidemia: Secondary | ICD-10-CM | POA: Diagnosis present

## 2018-04-18 DIAGNOSIS — E785 Hyperlipidemia, unspecified: Secondary | ICD-10-CM | POA: Diagnosis present

## 2018-04-18 DIAGNOSIS — R51 Headache: Secondary | ICD-10-CM | POA: Insufficient documentation

## 2018-04-18 DIAGNOSIS — R0789 Other chest pain: Secondary | ICD-10-CM | POA: Diagnosis not present

## 2018-04-18 DIAGNOSIS — M109 Gout, unspecified: Secondary | ICD-10-CM | POA: Diagnosis present

## 2018-04-18 DIAGNOSIS — I9589 Other hypotension: Secondary | ICD-10-CM | POA: Diagnosis present

## 2018-04-18 DIAGNOSIS — I10 Essential (primary) hypertension: Secondary | ICD-10-CM | POA: Diagnosis present

## 2018-04-18 LAB — I-STAT TROPONIN, ED: Troponin i, poc: 0 ng/mL (ref 0.00–0.08)

## 2018-04-18 LAB — CBC WITH DIFFERENTIAL/PLATELET
BASOS ABS: 0 10*3/uL (ref 0.0–0.1)
BASOS PCT: 0 %
Eosinophils Absolute: 0.3 10*3/uL (ref 0.0–0.7)
Eosinophils Relative: 3 %
HEMATOCRIT: 33.1 % — AB (ref 36.0–46.0)
HEMOGLOBIN: 11.1 g/dL — AB (ref 12.0–15.0)
LYMPHS PCT: 33 %
Lymphs Abs: 3.3 10*3/uL (ref 0.7–4.0)
MCH: 29.9 pg (ref 26.0–34.0)
MCHC: 33.5 g/dL (ref 30.0–36.0)
MCV: 89.2 fL (ref 78.0–100.0)
MONO ABS: 0.9 10*3/uL (ref 0.1–1.0)
Monocytes Relative: 9 %
NEUTROS ABS: 5.6 10*3/uL (ref 1.7–7.7)
NEUTROS PCT: 55 %
Platelets: 162 10*3/uL (ref 150–400)
RBC: 3.71 MIL/uL — ABNORMAL LOW (ref 3.87–5.11)
RDW: 13.3 % (ref 11.5–15.5)
WBC: 10 10*3/uL (ref 4.0–10.5)

## 2018-04-18 LAB — BASIC METABOLIC PANEL
Anion gap: 7 (ref 5–15)
BUN: 50 mg/dL — AB (ref 6–20)
CALCIUM: 8.8 mg/dL — AB (ref 8.9–10.3)
CO2: 21 mmol/L — AB (ref 22–32)
CREATININE: 1.91 mg/dL — AB (ref 0.44–1.00)
Chloride: 108 mmol/L (ref 98–111)
GFR calc Af Amer: 32 mL/min — ABNORMAL LOW (ref >60)
GFR, EST NON AFRICAN AMERICAN: 28 mL/min — AB (ref >60)
GLUCOSE: 81 mg/dL (ref 70–99)
Potassium: 4.8 mmol/L (ref 3.5–5.1)
Sodium: 136 mmol/L (ref 135–145)

## 2018-04-18 LAB — PREGNANCY, URINE: Preg Test, Ur: NEGATIVE

## 2018-04-18 LAB — TROPONIN I: Troponin I: <0.03 ng/mL (ref <0.03)

## 2018-04-18 LAB — BRAIN NATRIURETIC PEPTIDE: B Natriuretic Peptide: 295 pg/mL — ABNORMAL HIGH (ref 0.0–100.0)

## 2018-04-18 MED ORDER — MORPHINE SULFATE (PF) 4 MG/ML IV SOLN
4.0000 mg | INTRAVENOUS | Status: DC | PRN
  Administered 2018-04-19: 4 mg via INTRAVENOUS
  Filled 2018-04-18: qty 1

## 2018-04-18 MED ORDER — MORPHINE SULFATE (PF) 4 MG/ML IV SOLN
4.0000 mg | Freq: Once | INTRAVENOUS | Status: AC
  Administered 2018-04-19: 4 mg via INTRAVENOUS
  Filled 2018-04-18: qty 1

## 2018-04-18 MED ORDER — ALPRAZOLAM 0.25 MG PO TABS: 0.2500 mg | ORAL_TABLET | Freq: Two times a day (BID) | ORAL | Status: DC | PRN

## 2018-04-18 MED ORDER — ASPIRIN 81 MG PO CHEW
324.0000 mg | CHEWABLE_TABLET | Freq: Once | ORAL | Status: AC
  Administered 2018-04-18: 324 mg via ORAL
  Filled 2018-04-18: qty 4

## 2018-04-18 MED ORDER — NITROGLYCERIN 2 % TD OINT
1.0000 [in_us] | TOPICAL_OINTMENT | Freq: Four times a day (QID) | TRANSDERMAL | Status: AC
  Administered 2018-04-19 (×2): 1 [in_us] via TOPICAL
  Filled 2018-04-18 (×2): qty 1

## 2018-04-18 MED ORDER — MORPHINE SULFATE (PF) 4 MG/ML IV SOLN
4.0000 mg | Freq: Once | INTRAVENOUS | Status: AC
  Administered 2018-04-18: 4 mg via INTRAVENOUS
  Filled 2018-04-18: qty 1

## 2018-04-18 MED ORDER — ONDANSETRON HCL 4 MG/2ML IJ SOLN
4.0000 mg | Freq: Four times a day (QID) | INTRAMUSCULAR | Status: DC | PRN
  Administered 2018-04-19: 4 mg via INTRAVENOUS
  Filled 2018-04-18: qty 2

## 2018-04-18 MED ORDER — ENOXAPARIN SODIUM 40 MG/0.4ML ~~LOC~~ SOLN
40.0000 mg | SUBCUTANEOUS | Status: DC
  Administered 2018-04-19 – 2018-04-20 (×2): 40 mg via SUBCUTANEOUS
  Filled 2018-04-18 (×2): qty 0.4

## 2018-04-18 MED ORDER — ACETAMINOPHEN 325 MG PO TABS: 650.0000 mg | ORAL_TABLET | ORAL | Status: DC | PRN

## 2018-04-18 MED ORDER — ENOXAPARIN SODIUM 40 MG/0.4ML ~~LOC~~ SOLN: 40.0000 mg | SUBCUTANEOUS | Status: DC

## 2018-04-18 NOTE — ED Provider Notes (Signed)
[facility] EMERGENCY DEPARTMENT Provider Note   CSN: [id number] Arrival date & time: 04/18/18  2008     History   Chief Complaint Chief Complaint  Patient presents with  . Chest Pain    HPI Donna Howe is a 62 y.o. female.  The history is provided by the patient.  She has history of hypertension, hyperlipidemia, diabetes, coronary artery disease, chronic kidney disease and was discharged from the hospital 2 weeks ago following placement of 2 coronary stents.  She was doing well until yesterday when she just felt generally fatigued.  Today, she has had a vague discomfort in her chest which she describes as a pressure feeling her tight feeling.  She rates it at 7/10.  Nothing makes it better, nothing makes it worse.  It does not seem to be affected by exertion.  She cannot lay flat because she feels like she is smothering, but that issue predated current symptoms.  She denies dyspnea, nausea, vomiting.  There was mild diaphoresis.  She took 2 nitroglycerin tablets which did burn under her tongue and did give her headache, but did not affect her chest discomfort.  Past Medical History:  Diagnosis Date  . Anemia   . Asthma   . CAD (coronary artery disease)    Multivessel s/p CABG 2005, numerous PCIs since that time  . Carotid artery disease (HCC)    R CEA  . Chronic kidney disease (CKD), stage III (moderate) (HCC)   . Essential hypertension   . Gout   . Hyperlipidemia   . Hypothyroidism   . Myocardial infarction ([place])   . PAD (peripheral artery disease) ([place])    Dr. Kellie Simmering  . S/P angioplasty with stent- DES to [facility] and to [facility] to LAD with DES 04/09/18.   04/10/2018  . SBO (small bowel obstruction) ([place]) 2011   Status post lysis of adhesions & hernia repair  . Sinus bradycardia   . Thrombocytopenia, unspecified ([place])   . Type 2 diabetes mellitus ([place])   . Umbilical hernia     Patient Active Problem List   Diagnosis Date Noted  . S/P angioplasty with stent- DES to [facility]  and to [facility] to LAD with DES 04/09/18.   04/10/2018  . Angina pectoris ([place]) 04/05/2018  . Status post coronary artery stent placement   . ACS (acute coronary syndrome) ([place]) 05/31/2017  . Acute chest pain   . Non-ST elevation (NSTEMI) myocardial infarction ([place])   . History of coronary artery disease   . Diabetes ([place]) 10/28/2013  . Hypoglycemia associated with diabetes ([place]) 10/28/2013  . Thrombocytopenia, unspecified ([place]) 10/28/2013  . Hypokalemia 10/27/2013  . Syncope 10/26/2013  . Anemia 10/26/2013  . Renal failure, acute on chronic (HCC) 10/26/2013  . Fracture of toe of right foot 10/26/2013  . Umbilical hernia   . Carotid artery disease ([place])   . Occlusion and stenosis of carotid artery without mention of cerebral infarction 04/15/2013  . Hx of CABG   . Ejection fraction   . Atherosclerosis of native arteries of the extremities with intermittent claudication 02/11/2013  . Diabetes mellitus with renal manifestation ([place]) 01/21/2013  . Bradycardia 01/03/2013  . Chronic kidney disease (CKD), stage III (moderate) (HCC)   . Gout   . PROTEINURIA, MILD 01/18/2010  . TOBACCO ABUSE 08/27/2009  . Obesity (BMI 30-39.9) 08/26/2009  . Asthma 08/26/2009  . Hyperlipidemia 03/31/2007  . Essential hypertension 03/31/2007  . CAD (coronary artery disease) 03/31/2007    Past Surgical History:  Procedure Laterality  Date  . [place]  . CHOLECYSTECTOMY  2010  . CORONARY ARTERY BYPASS GRAFT  2005  . CORONARY BALLOON ANGIOPLASTY N/A 05/31/2017   Procedure: CORONARY BALLOON ANGIOPLASTY;  Surgeon: Jettie Booze, MD;  Location: [place] CV LAB;  Service: Cardiovascular;  Laterality: N/A;  . CORONARY STENT INTERVENTION N/A 05/31/2017   Procedure: CORONARY STENT INTERVENTION;  Surgeon: Jettie Booze, MD;  Location: [place] CV LAB;  Service: Cardiovascular;  Laterality: N/A;  . CORONARY STENT INTERVENTION N/A 04/09/2018   Procedure: CORONARY STENT INTERVENTION;   Surgeon: Jettie Booze, MD;  Location: [place] CV LAB;  Service: Cardiovascular;  Laterality: N/A;  SVG RCA  . ENDARTERECTOMY Right 04/18/2013   Procedure: ENDARTERECTOMY CAROTID;  Surgeon: Mal Misty, MD;  Location: [place];  Service: Vascular;  Laterality: Right;  . HERNIA REPAIR  1989  . Incisional hernia repair x2  03/04/2010   Laparoscopic with 35cm mesh by Dr Ronnald Collum  . LEFT HEART CATH AND CORS/GRAFTS ANGIOGRAPHY N/A 05/31/2017   Procedure: LEFT HEART CATH AND CORS/GRAFTS ANGIOGRAPHY;  Surgeon: Jettie Booze, MD;  Location: [place] CV LAB;  Service: Cardiovascular;  Laterality: N/A;  . LEFT HEART CATH AND CORS/GRAFTS ANGIOGRAPHY N/A 04/08/2018   Procedure: LEFT HEART CATH AND CORS/GRAFTS ANGIOGRAPHY;  Surgeon: Jettie Booze, MD;  Location: [place] CV LAB;  Service: Cardiovascular;  Laterality: N/A;  . LEFT HEART CATHETERIZATION WITH CORONARY ANGIOGRAM N/A 12/19/2012   Procedure: LEFT HEART CATHETERIZATION WITH CORONARY ANGIOGRAM;  Surgeon: Josue Hector, MD;  Location: [facility] CATH LAB;  Service: Cardiovascular;  Laterality: N/A;  . LEFT HEART CATHETERIZATION WITH CORONARY/GRAFT ANGIOGRAM N/A 04/19/2013   Procedure: LEFT HEART CATHETERIZATION WITH Beatrix Fetters;  Surgeon: Lorretta Harp, MD;  Location: [facility] CATH LAB;  Service: Cardiovascular;  Laterality: N/A;  . PATCH ANGIOPLASTY Right 04/18/2013   Procedure: PATCH ANGIOPLASTY Right Internal Carotid Artery;  Surgeon: Mal Misty, MD;  Location: [place];  Service: Vascular;  Laterality: Right;  . PERCUTANEOUS CORONARY STENT INTERVENTION (PCI-S) Right 12/19/2012   Procedure: PERCUTANEOUS CORONARY STENT INTERVENTION (PCI-S);  Surgeon: Josue Hector, MD;  Location: [facility] CATH LAB;  Service: Cardiovascular;  Laterality: Right;  . SHOULDER SURGERY       OB History   None      Home Medications    Prior to Admission medications   Medication Sig Start Date End Date Taking? Authorizing Provider  ALPRAZolam  Duanne Moron) 0.5 MG tablet Take 1/2 tab by mouth every morning & evening and 1 tab at bedtime    [provider]  amLODipine (NORVASC) 10 MG tablet Take 1 tablet (10 mg total) by mouth daily. 04/10/18   Isaiah Serge, NP  aspirin EC 81 MG tablet Take 81 mg by mouth daily.    [provider]  atorvastatin (LIPITOR) 80 MG tablet Take 1 tablet (80 mg total) by mouth daily. 06/01/17   Johnson, Clanford L, MD  colchicine 0.6 MG tablet Take 0.6 mg by mouth daily.     [provider]  gabapentin (NEURONTIN) 400 MG capsule Take 400 mg by mouth at bedtime.     Rory Percy, MD  hydrALAZINE (APRESOLINE) 10 MG tablet Take 1 tablet (10 mg total) by mouth every 8 (eight) hours. 04/10/18   Isaiah Serge, NP  isosorbide dinitrate (ISORDIL) 5 MG tablet Take 1 tablet (5 mg total) by mouth 3 (three) times daily. 04/10/18   Isaiah Serge, NP  levothyroxine (SYNTHROID, LEVOTHROID)  125 MCG tablet Take 125 mcg by mouth every morning.    [provider]  metoprolol tartrate (LOPRESSOR) 25 MG tablet Take 1 tablet (25 mg total) by mouth 2 (two) times daily. 04/10/18   Isaiah Serge, NP  nitroGLYCERIN (NITROSTAT) 0.4 MG SL tablet Place 0.4 mg under the tongue every 5 (five) minutes as needed for chest pain. Not to exceed 3 in 15 minute time frame    [provider]  NOVOLIN 70/30 RELION (70-30) 100 UNIT/ML injection Inject 20 Units into the skin 2 (two) times daily with a meal.  08/28/15   [provider]  ondansetron (ZOFRAN ODT) 8 MG disintegrating tablet Take 1 tablet (8 mg total) by mouth every 8 (eight) hours as needed for nausea or vomiting. 08/14/15   Evalee Jefferson, PA-C  ticagrelor (BRILINTA) 90 MG TABS tablet Take 1 tablet (90 mg total) by mouth 2 (two) times daily. 04/10/18   Isaiah Serge, NP    Family History Family History  Problem Relation Age of Onset  . Diabetes Mother   . Heart disease Mother        before age 16  . Hyperlipidemia Mother   .  Hypertension Mother   . Thyroid disease Father   . Hypertension Father   . AAA (abdominal aortic aneurysm) Father   . Heart disease Brother        before age 38  . Hypertension Brother   . Hyperlipidemia Son   . Hypertension Son     Social History Social History   Tobacco Use  . Smoking status: Former Smoker    Packs/day: 1.00    Years: 20.00    Pack years: 20.00    Types: Cigarettes    Last attempt to quit: 12/10/2012    Years since quitting: 5.3  . Smokeless tobacco: Never Used  Substance Use Topics  . Alcohol use: No    Alcohol/week: 0.0 oz  . Drug use: No     Allergies   Penicillins   Review of Systems Review of Systems  All other systems reviewed and are negative.    Physical Exam Updated Vital Signs BP (!) 171/58 (BP Location: Right Arm)   Pulse (!) 55   Temp 98.7 F (37.1 C) (Oral)   Resp 16   SpO2 98%   Physical Exam  Nursing note and vitals reviewed.  62 year old female, resting comfortably and in no acute distress. Vital signs are significant for elevated blood pressure and mild bradycardia. Oxygen saturation is 98%, which is normal. Head is normocephalic and atraumatic. PERRLA, EOMI. Oropharynx is clear. Neck is nontender and supple without adenopathy or JVD. Back is nontender and there is no CVA tenderness. Lungs are clear without rales, wheezes, or rhonchi. Chest is nontender. Heart has regular rate and rhythm without murmur. Abdomen is soft, flat, nontender without masses or hepatosplenomegaly and peristalsis is normoactive. Extremities have no cyanosis or edema, full range of motion is present. Skin is warm and dry without rash. Neurologic: Mental status is normal, cranial nerves are intact, there are no motor or sensory deficits.  ED Treatments / Results  Labs (all labs ordered are listed, but only abnormal results are displayed) Labs Reviewed  BASIC METABOLIC PANEL - Abnormal; Notable for the following components:      Result Value     CO2 21 (*)    BUN 50 (*)    Creatinine, Ser 1.91 (*)    Calcium 8.8 (*)    GFR  calc non Af Amer 28 (*)    GFR calc Af Amer 32 (*)    All other components within normal limits  CBC WITH DIFFERENTIAL/PLATELET - Abnormal; Notable for the following components:   RBC 3.71 (*)    Hemoglobin 11.1 (*)    HCT 33.1 (*)    All other components within normal limits  BRAIN NATRIURETIC PEPTIDE - Abnormal; Notable for the following components:   B Natriuretic Peptide 295.0 (*)    All other components within normal limits  TROPONIN I  PREGNANCY, URINE  I-STAT TROPONIN, ED    EKG EKG Interpretation  Date/Time:  Thursday April 18 2018 21:01:24 EDT Ventricular Rate:  48 PR Interval:    QRS Duration: 112 QT Interval:  464 QTC Calculation: 415 R Axis:   81 Text Interpretation:  Sinus bradycardia Borderline intraventricular conduction delay Abnormal T, consider ischemia, lateral leads When compared with ECG of 04/10/2018, No significant change was found Confirmed by Delora Fuel ([id number]) on 04/18/2018 9:06:19 PM   Radiology Dg Chest 2 View  Result Date: 04/18/2018 CLINICAL DATA:  Chest pain.  Weakness for 2 days. EXAM: CHEST - 2 VIEW COMPARISON:  Radiograph 04/04/2018 FINDINGS: Post median sternotomy. Stable mild cardiomegaly, unchanged mediastinal contours. Coronary stent is visualized. Pulmonary vasculature is normal. No consolidation, pleural effusion, or pneumothorax. No acute osseous abnormalities are seen. IMPRESSION: 1. No acute findings. 2. Stable mild cardiomegaly. Electronically Signed   By: Jeb Levering M.D.   On: 04/18/2018 21:48    Procedures Procedures   Medications Ordered in ED Medications  morphine 4 MG/ML injection 4 mg (has no administration in time range)  aspirin chewable tablet 324 mg (324 mg Oral Given 04/18/18 2217)  morphine 4 MG/ML injection 4 mg (4 mg Intravenous Given 04/18/18 2217)     Initial Impression / Assessment and Plan / ED Course  I have reviewed the  triage vital signs and the nursing notes.  Pertinent labs & imaging results that were available during my care of the patient were reviewed by me and considered in my medical decision making (see chart for details).  Chest discomfort which may be an angina equivalent.  Old records are reviewed confirming placement of coronary stents on July 22 23rd.  She had a stenosis in 1 of her bypass grafts which was successfully treated with stent, and a 90% lesion of her right coronary artery which was successfully treated with a stent.  However, there is mention of ostial LAD to proximal LAD 80% stenosis, and first obtuse marginal branch having an 80% stenosis and 70% stenosis of which apparently were not addressed.  Therefore, it does seem that she has myocardium at risk.  Initial ECG shows no acute changes.  She is given aspirin and will check troponin.  Given her findings on catheterization, I anticipate need for serial troponins.  Troponin is normal.  Elevated creatinine is present only mildly increased from baseline.  Anemia is present slightly worse than baseline.  She had partial relief of pain with morphine and is given his second dose of morphine.  Case is discussed with Dr. Olevia Bowens of [facility], who agrees to admit the patient.  Final Clinical Impressions(s) / ED Diagnoses   Final diagnoses:  Nonspecific chest pain  Renal insufficiency  Normochromic normocytic anemia    ED Discharge Orders    None       Delora Fuel, MD 16/96/78 [id number]

## 2018-04-18 NOTE — ED Triage Notes (Signed)
Pt C/O of feeling weak since Tuesday. Pt also C/O "chest discomfort" that has been off and on all day. Pt states she took 2 nitroglycerin around 2000 with no relief.

## 2018-04-18 NOTE — H&P (Signed)
History and Physical    Donna Howe VHQ:[id number] DOB: 1956-05-29 DOA: 04/18/2018  PCP: Rory Percy, MD   Patient coming from: Home.  I have personally briefly reviewed patient's old medical records in [place]  Chief Complaint: Chest pain.  HPI: Donna Howe is a 62 y.o. female with medical history significant of anemia, asthma/COPD, gout, stage III chronic kidney disease, hyperlipidemia, hypothyroidism, history of small bowel obstruction, history of unspecified thrombocytopenia, type 2 diabetes, right carotid artery disease with history of right carotid endarterectomy, grade 2 diastolic dysfunction, CAD, history of CABG, history of multiple coronary arteries stenting, last stent placement was on 04/09/2018 who is coming to the emergency department with complaints of on and off chest pain since yesterday.  She describes this as dull pressure, non-radiated without dyspnea, lightheadedness, nausea or palpitations.  She took 2 sublingual nitroglycerin without significant relief. It is not relieved or worsened by exertion.  She denies any recent lower extremity edema, PND or orthopnea.  She denies fever, chills, sore throat, wheezing, hemoptysis, abdominal pain, emesis, diarrhea, constipation, melena or hematochezia.  No dysuria, frequency or hematuria.  Denies heat or cold intolerance.  Denies polyuria, polydipsia, polyphagia or blurred vision.  Denies rashes or pruritus.  She states that she feels depressed, but denies suicidal thoughts.  However, upon further review, she lost her partner of 17 years earlier this year and her mother passed away in Mar 23, 2023.  So this may be a normal grieving reaction.  ED Course: Initial vital signs temperature 98.4 F, pulse 55, respirations 16, blood pressure 171/58 mmHg and O2 sat 98% on room air.  She was given 2 doses of morphine 4 mg IVP and 324 mg of chewable aspirin in the ED.  Her EKG shows sinus bradycardia at 48 bpm with borderline IVCD delay.   Questionable T wave abnormality in lateral leads.  Troponin so far has been negative.  BNP was 295.0 pg/mL.  White count was 10.0 with normal differential.  Hemoglobin 11.1 g/dL and platelets 162.  Sodium 136, potassium 4.8, chloride 108 and CO2 21 mmol/L.  Glucose 181, BUN 50, creatinine 1.91 mg/dL.  Creatinine is slightly higher than last week.  Her chest radiograph showed mild cardiomegaly, but no acute findings.  Review of Systems: As per HPI otherwise 10 point review of systems negative.   Past Medical History:  Diagnosis Date  . Anemia   . Asthma   . CAD (coronary artery disease)    Multivessel s/p CABG 2005, numerous PCIs since that time  . Carotid artery disease (HCC)    R CEA  . Chronic kidney disease (CKD), stage III (moderate) (HCC)   . Essential hypertension   . Gout   . Hyperlipidemia   . Hypothyroidism   . Myocardial infarction ([place])   . PAD (peripheral artery disease) ([place])    Dr. Kellie Simmering  . S/P angioplasty with stent- DES to [facility] and to LIMA to LAD with DES 04/09/18.   04/10/2018  . SBO (small bowel obstruction) ([place]) 2011   Status post lysis of adhesions & hernia repair  . Sinus bradycardia   . Thrombocytopenia, unspecified ([place])   . Type 2 diabetes mellitus ([place])   . Umbilical hernia     Past Surgical History:  Procedure Laterality Date  . CESAREAN SECTION  1984  . CHOLECYSTECTOMY  2010  . CORONARY ARTERY BYPASS GRAFT  2005  . CORONARY BALLOON ANGIOPLASTY N/A 05/31/2017   Procedure: CORONARY BALLOON ANGIOPLASTY;  Surgeon: Jettie Booze, MD;  Location: [place] CV LAB;  Service: Cardiovascular;  Laterality: N/A;  . CORONARY STENT INTERVENTION N/A 05/31/2017   Procedure: CORONARY STENT INTERVENTION;  Surgeon: Jettie Booze, MD;  Location: [place] CV LAB;  Service: Cardiovascular;  Laterality: N/A;  . CORONARY STENT INTERVENTION N/A 04/09/2018   Procedure: CORONARY STENT INTERVENTION;  Surgeon: Jettie Booze, MD;  Location: [place] CV  LAB;  Service: Cardiovascular;  Laterality: N/A;  SVG RCA  . ENDARTERECTOMY Right 04/18/2013   Procedure: ENDARTERECTOMY CAROTID;  Surgeon: Mal Misty, MD;  Location: [place];  Service: Vascular;  Laterality: Right;  . HERNIA REPAIR  1989  . Incisional hernia repair x2  03/04/2010   Laparoscopic with 35cm mesh by Dr Ronnald Collum  . LEFT HEART CATH AND CORS/GRAFTS ANGIOGRAPHY N/A 05/31/2017   Procedure: LEFT HEART CATH AND CORS/GRAFTS ANGIOGRAPHY;  Surgeon: Jettie Booze, MD;  Location: [place] CV LAB;  Service: Cardiovascular;  Laterality: N/A;  . LEFT HEART CATH AND CORS/GRAFTS ANGIOGRAPHY N/A 04/08/2018   Procedure: LEFT HEART CATH AND CORS/GRAFTS ANGIOGRAPHY;  Surgeon: Jettie Booze, MD;  Location: [place] CV LAB;  Service: Cardiovascular;  Laterality: N/A;  . LEFT HEART CATHETERIZATION WITH CORONARY ANGIOGRAM N/A 12/19/2012   Procedure: LEFT HEART CATHETERIZATION WITH CORONARY ANGIOGRAM;  Surgeon: Josue Hector, MD;  Location: [facility] CATH LAB;  Service: Cardiovascular;  Laterality: N/A;  . LEFT HEART CATHETERIZATION WITH CORONARY/GRAFT ANGIOGRAM N/A 04/19/2013   Procedure: LEFT HEART CATHETERIZATION WITH Beatrix Fetters;  Surgeon: Lorretta Harp, MD;  Location: [facility] CATH LAB;  Service: Cardiovascular;  Laterality: N/A;  . PATCH ANGIOPLASTY Right 04/18/2013   Procedure: PATCH ANGIOPLASTY Right Internal Carotid Artery;  Surgeon: Mal Misty, MD;  Location: [place];  Service: Vascular;  Laterality: Right;  . PERCUTANEOUS CORONARY STENT INTERVENTION (PCI-S) Right 12/19/2012   Procedure: PERCUTANEOUS CORONARY STENT INTERVENTION (PCI-S);  Surgeon: Josue Hector, MD;  Location: [facility] CATH LAB;  Service: Cardiovascular;  Laterality: Right;  . SHOULDER SURGERY       reports that she quit smoking about 5 years ago. Her smoking use included cigarettes. She has a 20.00 pack-year smoking history. She has never used smokeless tobacco. She reports that she does not drink alcohol or use  drugs.  Allergies  Allergen Reactions  . Penicillins Other (See Comments)    REACTION: Unknown, told as a child Has patient had a PCN reaction causing immediate rash, facial/tongue/throat swelling, SOB or lightheadedness with hypotension: Unknown Has patient had a PCN reaction causing severe rash involving mucus membranes or skin necrosis: Unknown Has patient had a PCN reaction that required hospitalization: Unknown Has patient had a PCN reaction occurring within the last 10 years: No If all of the above answers are "NO", then may proceed with Cephalosporin use.     Family History  Problem Relation Age of Onset  . Diabetes Mother   . Heart disease Mother        before age 55  . Hyperlipidemia Mother   . Hypertension Mother   . Thyroid disease Father   . Hypertension Father   . AAA (abdominal aortic aneurysm) Father   . Heart disease Brother        before age 60  . Hypertension Brother   . Hyperlipidemia Son   . Hypertension Son     Prior to Admission medications   Medication Sig Start Date End Date Taking? Authorizing Provider  ALPRAZolam Duanne Moron) 0.5 MG tablet Take 1/2 tab by mouth every morning &  evening and 1 tab at bedtime   Yes [provider]  amLODipine (NORVASC) 10 MG tablet Take 1 tablet (10 mg total) by mouth daily. 04/10/18  Yes Isaiah Serge, NP  aspirin EC 81 MG tablet Take 81 mg by mouth daily.   Yes [provider]  atorvastatin (LIPITOR) 80 MG tablet Take 1 tablet (80 mg total) by mouth daily. 06/01/17  Yes Johnson, Clanford L, MD  colchicine 0.6 MG tablet Take 0.6 mg by mouth daily.    Yes [provider]  gabapentin (NEURONTIN) 400 MG capsule Take 400 mg by mouth at bedtime.    Yes Rory Percy, MD  hydrALAZINE (APRESOLINE) 10 MG tablet Take 1 tablet (10 mg total) by mouth every 8 (eight) hours. Patient taking differently: Take 10 mg by mouth 2 (two) times daily.  04/10/18  Yes Isaiah Serge, NP  isosorbide dinitrate (ISORDIL) 5  MG tablet Take 1 tablet (5 mg total) by mouth 3 (three) times daily. Patient taking differently: Take 5 mg by mouth 2 (two) times daily.  04/10/18  Yes Isaiah Serge, NP  levothyroxine (SYNTHROID, LEVOTHROID) 125 MCG tablet Take 125 mcg by mouth every morning.   Yes [provider]  metoprolol tartrate (LOPRESSOR) 25 MG tablet Take 1 tablet (25 mg total) by mouth 2 (two) times daily. Patient taking differently: Take 25 mg by mouth every morning.  04/10/18  Yes Isaiah Serge, NP  nitroGLYCERIN (NITROSTAT) 0.4 MG SL tablet Place 0.4 mg under the tongue every 5 (five) minutes as needed for chest pain. Not to exceed 3 in 15 minute time frame   Yes [provider]  NOVOLIN 70/30 RELION (70-30) 100 UNIT/ML injection Inject 24 Units into the skin 2 (two) times daily with a meal.  08/28/15  Yes [provider]  ondansetron (ZOFRAN ODT) 8 MG disintegrating tablet Take 1 tablet (8 mg total) by mouth every 8 (eight) hours as needed for nausea or vomiting. 08/14/15  Yes Idol, Almyra Free, PA-C  ticagrelor (BRILINTA) 90 MG TABS tablet Take 1 tablet (90 mg total) by mouth 2 (two) times daily. 04/10/18  Yes Isaiah Serge, NP  VENTOLIN HFA 108 (90 Base) MCG/ACT inhaler INHALE 2 PUFFS BY MOUTH 4 TIMES DAILY AS NEEDED FOR SHORTNESS OF BREATH 04/13/18  Yes [provider]    Physical Exam: Vitals:   04/18/18 2034  BP: (!) 171/58  Pulse: (!) 55  Resp: 16  Temp: 98.7 F (37.1 C)  TempSrc: Oral  SpO2: 98%    Constitutional: NAD, calm, comfortable Eyes: PERRL, lids and conjunctivae normal ENMT: Mucous membranes are moist. Posterior pharynx clear of any exudate or lesions.  Neck: normal, supple, no masses, no thyromegaly Respiratory: Clear to auscultation bilaterally, no wheezing, no crackles. Normal respiratory effort. No accessory muscle use.  Cardiovascular: Bradycardic at 48 bpm, no murmurs / rubs / gallops. No extremity edema. 2+ pedal pulses. No carotid bruits.  Abdomen:  Soft, no tenderness, no masses palpated. No hepatosplenomegaly. Bowel sounds positive.  Musculoskeletal: no clubbing / cyanosis. Good ROM, no contractures. Normal muscle tone.  Skin: no rashes, lesions, ulcers on limited dermatological examination. Neurologic: CN 2-12 grossly intact. Sensation intact, DTR normal. Strength 5/5 in all 4.  Psychiatric: Normal judgment and insight. Alert and oriented x 4. Normal mood.   Labs on Admission: I have personally reviewed following labs and imaging studies  CBC: Recent Labs  Lab 04/18/18 2155  WBC 10.0  NEUTROABS 5.6  HGB 11.1*  HCT 33.1*  MCV 89.2  PLT 209   Basic Metabolic Panel: Recent Labs  Lab 04/18/18 2155  NA 136  K 4.8  CL 108  CO2 21*  GLUCOSE 81  BUN 50*  CREATININE 1.91*  CALCIUM 8.8*   GFR: Estimated Creatinine Clearance: 35.1 mL/min (A) (by C-G formula based on SCr of 1.91 mg/dL (H)). Liver Function Tests: No results for input(s): AST, ALT, ALKPHOS, BILITOT, PROT, ALBUMIN in the last 168 hours. No results for input(s): LIPASE, AMYLASE in the last 168 hours. No results for input(s): AMMONIA in the last 168 hours. Coagulation Profile: No results for input(s): INR, PROTIME in the last 168 hours. Cardiac Enzymes: Recent Labs  Lab 04/18/18 2155  TROPONINI <0.03   BNP (last 3 results) No results for input(s): PROBNP in the last 8760 hours. HbA1C: No results for input(s): HGBA1C in the last 72 hours. CBG: No results for input(s): GLUCAP in the last 168 hours. Lipid Profile: No results for input(s): CHOL, HDL, LDLCALC, TRIG, CHOLHDL, LDLDIRECT in the last 72 hours. Thyroid Function Tests: No results for input(s): TSH, T4TOTAL, FREET4, T3FREE, THYROIDAB in the last 72 hours. Anemia Panel: No results for input(s): VITAMINB12, FOLATE, FERRITIN, TIBC, IRON, RETICCTPCT in the last 72 hours. Urine analysis:    Component Value Date/Time   COLORURINE YELLOW 08/13/2015 1840   APPEARANCEUR HAZY (A) 08/13/2015 1840    LABSPEC 1.015 08/13/2015 1840   PHURINE 6.0 08/13/2015 1840   GLUCOSEU NEGATIVE 08/13/2015 1840   HGBUR NEGATIVE 08/13/2015 1840   BILIRUBINUR NEGATIVE 08/13/2015 1840   KETONESUR NEGATIVE 08/13/2015 1840   PROTEINUR 100 (A) 08/13/2015 1840   UROBILINOGEN 0.2 10/26/2013 2229   NITRITE NEGATIVE 08/13/2015 1840   LEUKOCYTESUR TRACE (A) 08/13/2015 1840    Radiological Exams on Admission: Dg Chest 2 View  Result Date: 04/18/2018 CLINICAL DATA:  Chest pain.  Weakness for 2 days. EXAM: CHEST - 2 VIEW COMPARISON:  Radiograph 04/04/2018 FINDINGS: Post median sternotomy. Stable mild cardiomegaly, unchanged mediastinal contours. Coronary stent is visualized. Pulmonary vasculature is normal. No consolidation, pleural effusion, or pneumothorax. No acute osseous abnormalities are seen. IMPRESSION: 1. No acute findings. 2. Stable mild cardiomegaly. Electronically Signed   By: Jeb Levering M.D.   On: 04/18/2018 21:48   04/08/2018 left heart cath and coronary angiography   Non-stenotic Ost LM to LM lesion previously treated.  Ost LAD to Prox LAD lesion is 80% stenosed. LIMA to LAD is patent with a proximal 60% lesion.  Non-stenotic Ost Cx to Mid Cx lesion previously treated.  Prox Cx to Mid Cx lesion is 50% stenosed.  Balloon angioplasty was performed.  1st Mrg-1 lesion is 70% stenosed.  Ost 1st Mrg lesion is 80% stenosed.  1st Mrg-2 lesion is 10% stenosed.  Origin to Prox Graft lesion is 100% stenosed. SVG to OM is occluded.  Previously placed Origin drug eluting stent is widely patent.  Balloon angioplasty was performed.  Mid RCA lesion is 90% stenosed. THis is past the insertion of the graft.  LV end diastolic pressure is mildly elevated.  There is no aortic valve stenosis.  Origin lesion is 60% stenosed.  Recommend dual antiplatelet therapy with Aspirin 81mg  daily and Clopidogrel 75mg  daily long-term (beyond 12 months) because of extensive CAD, and multiple stents..    Will look into repeat PCI attempt tomorrow if Cr is ok.  Could consider redo CABG eval as well given extensive circ disease and now two lesions in the SVG to RCA in the last 12 months, proximal LIMA lesion.  Will discuss with interventional colleagues.  04/09/2018 coronary stent intervention   Non-stenotic Ost LM to LM lesion previously treated.  Ost LAD to Prox LAD lesion is 80% stenosed.  Non-stenotic Ost Cx to Mid Cx lesion previously treated.  1st Mrg-1 lesion is 70% stenosed.  Ost 1st Mrg lesion is 80% stenosed.  1st Mrg-2 lesion is 10% stenosed.  Prox Cx to Mid Cx lesion is 50% stenosed.  Patent stent in the ostium of the SVG to the RCA  Mid RCA lesion is 90% stenosed.  A drug-eluting stent was successfully placed using a STENT SIERRA 2.50 X 15 MM, through the SVG to RCA graft.  Post intervention, there is a 0% residual stenosis.  Origin lesion is 70% stenosed of the LIMA to LAD.  A drug-eluting stent was successfully placed using a STENT SYNERGY DES 3X12, postdilated to 3.25 mm.  Post intervention, there is a 0% residual stenosis.    Recommend uninterrupted dual antiplatelet therapy with Aspirin 81mg  daily and Ticagrelor 90mg  twice daily for a minimum of 12 months (ACS - Class I recommendation).   Continue aggressive secondary prevention.    04/05/2018 echocardiogram  ------------------------------------------------------------------- LV EF: 60% -   65%  ------------------------------------------------------------------- Indications:      Chest pain 786.51.  ------------------------------------------------------------------- History:   PMH:  Former SMoker, Bradycardia.  Risk factors: Hypertension. Diabetes mellitus. Dyslipidemia.  ------------------------------------------------------------------- Study Conclusions  - Left ventricle: The cavity size was normal. Wall thickness was   normal. Systolic function was normal. The estimated ejection    fraction was in the range of 60% to 65%. Wall motion was normal;   there were no regional wall motion abnormalities. Features are   consistent with a pseudonormal left ventricular filling pattern,   with concomitant abnormal relaxation and increased filling   pressure (grade 2 diastolic dysfunction). Doppler parameters are   consistent with high ventricular filling pressure. - Aortic valve: Valve area (VTI): 1.56 cm^2. Valve area (Vmax):   1.63 cm^2. - Mitral valve: There was mild regurgitation. - Left atrium: The atrium was mildly dilated. - Technically adequate study.   EKG: Independently reviewed.  Vent. rate 48 BPM PR interval * ms QRS duration 112 ms QT/QTc 464/415 ms P-R-T axes 76 81 108 Sinus bradycardia Borderline intraventricular conduction delay Abnormal T, consider ischemia, lateral leads  Assessment/Plan Principal Problem:   Unstable angina ([place]) Still having active chest pain. She received aspirin 324 mg chewable in the ED. Observation/stepdown. Supplemental oxygen. Nitropaste to anterior chest wall. Morphine as needed for pain. Trend troponin level. Consult cardiology later today.    CAD (coronary artery disease) Continue aspirin, atorvastatin and ticagrelor. Hold metoprolol due to sinus bradycardia.  Active Problems:   Bradycardia Hold beta-blocker. Check TSH.    Hyperlipidemia Continue atorvastatin 80 mg p.o. daily. Follow LFTs as needed. Fasting lipid follow-up as an outpatient.    Essential hypertension Hold metoprolol.   Continue amlodipine 10 mg p.o. daily. Continue hydralazine 10 mg p.o. twice daily. Monitor blood pressure and heart rate.    Gout Continue daily colchicine.    Diabetes mellitus with renal manifestation (HCC) Carb modified diet. Continue 70/30 insulin or formulary equivalent 24 units SQ twice a day. CBG monitoring with regular insulin sliding scale.    Anemia Monitor hematocrit and hemoglobin.     Hypothyroidism Given bradycardia, check TSH. Continue levothyroxine at 125 mcg p.o. daily. Adjust levothyroxine dosage as needed.    DVT prophylaxis: Lovenox SQ. Code Status: Full code. Family Communication: Disposition Plan: Observation for troponin  level trending and cardiology consult. Consults called: Routine cardiology consult a.m. Admission status: Observation/stepdown.   Reubin Milan MD [facility] Pager [phone].  If 7PM-7AM, please contact night-coverage [email or web address] Password [facility]  04/18/2018, 11:42 PM

## 2018-04-19 ENCOUNTER — Encounter (HOSPITAL_COMMUNITY): Payer: Self-pay | Admitting: *Deleted

## 2018-04-19 DIAGNOSIS — E039 Hypothyroidism, unspecified: Secondary | ICD-10-CM | POA: Diagnosis not present

## 2018-04-19 DIAGNOSIS — Z794 Long term (current) use of insulin: Secondary | ICD-10-CM | POA: Diagnosis not present

## 2018-04-19 DIAGNOSIS — I10 Essential (primary) hypertension: Secondary | ICD-10-CM | POA: Diagnosis not present

## 2018-04-19 DIAGNOSIS — E782 Mixed hyperlipidemia: Secondary | ICD-10-CM | POA: Diagnosis not present

## 2018-04-19 DIAGNOSIS — N184 Chronic kidney disease, stage 4 (severe): Secondary | ICD-10-CM

## 2018-04-19 DIAGNOSIS — E1122 Type 2 diabetes mellitus with diabetic chronic kidney disease: Secondary | ICD-10-CM

## 2018-04-19 DIAGNOSIS — R079 Chest pain, unspecified: Secondary | ICD-10-CM | POA: Diagnosis not present

## 2018-04-19 DIAGNOSIS — I2510 Atherosclerotic heart disease of native coronary artery without angina pectoris: Secondary | ICD-10-CM

## 2018-04-19 DIAGNOSIS — R0789 Other chest pain: Secondary | ICD-10-CM | POA: Diagnosis not present

## 2018-04-19 LAB — GLUCOSE, CAPILLARY
GLUCOSE-CAPILLARY: 62 mg/dL — AB (ref 70–99)
GLUCOSE-CAPILLARY: 96 mg/dL (ref 70–99)
GLUCOSE-CAPILLARY: 152 mg/dL — AB (ref 70–99)
Glucose-Capillary: 100 mg/dL — ABNORMAL HIGH (ref 70–99)
Glucose-Capillary: 133 mg/dL — ABNORMAL HIGH (ref 70–99)

## 2018-04-19 LAB — CBC
HEMATOCRIT: 33.4 % — AB (ref 36.0–46.0)
HEMOGLOBIN: 11 g/dL — AB (ref 12.0–15.0)
MCH: 29.8 pg (ref 26.0–34.0)
MCHC: 32.9 g/dL (ref 30.0–36.0)
MCV: 90.5 fL (ref 78.0–100.0)
Platelets: 150 10*3/uL (ref 150–400)
RBC: 3.69 MIL/uL — AB (ref 3.87–5.11)
RDW: 13.3 % (ref 11.5–15.5)
WBC: 9.1 10*3/uL (ref 4.0–10.5)

## 2018-04-19 LAB — BASIC METABOLIC PANEL
ANION GAP: 5 (ref 5–15)
BUN: 45 mg/dL — ABNORMAL HIGH (ref 6–20)
CALCIUM: 8.8 mg/dL — AB (ref 8.9–10.3)
CO2: 25 mmol/L (ref 22–32)
Chloride: 109 mmol/L (ref 98–111)
Creatinine, Ser: 1.84 mg/dL — ABNORMAL HIGH (ref 0.44–1.00)
GFR, EST AFRICAN AMERICAN: 34 mL/min — AB (ref >60)
GFR, EST NON AFRICAN AMERICAN: 29 mL/min — AB (ref >60)
GLUCOSE: 129 mg/dL — AB (ref 70–99)
POTASSIUM: 5 mmol/L (ref 3.5–5.1)
Sodium: 139 mmol/L (ref 135–145)

## 2018-04-19 LAB — TROPONIN I: Troponin I: <0.03 ng/mL (ref <0.03)

## 2018-04-19 MED ORDER — GABAPENTIN 400 MG PO CAPS
400.0000 mg | ORAL_CAPSULE | Freq: Every day | ORAL | Status: DC
  Administered 2018-04-19 (×2): 400 mg via ORAL
  Filled 2018-04-19 (×2): qty 1

## 2018-04-19 MED ORDER — ATORVASTATIN CALCIUM 40 MG PO TABS
80.0000 mg | ORAL_TABLET | Freq: Every day | ORAL | Status: DC
  Administered 2018-04-19 – 2018-04-20 (×2): 80 mg via ORAL
  Filled 2018-04-19 (×2): qty 2

## 2018-04-19 MED ORDER — METOPROLOL TARTRATE 25 MG PO TABS: 12.5000 mg | ORAL_TABLET | Freq: Two times a day (BID) | ORAL | Status: DC

## 2018-04-19 MED ORDER — COLCHICINE 0.6 MG PO TABS
0.6000 mg | ORAL_TABLET | Freq: Every day | ORAL | Status: DC
  Administered 2018-04-19 – 2018-04-20 (×2): 0.6 mg via ORAL
  Filled 2018-04-19 (×2): qty 1

## 2018-04-19 MED ORDER — INSULIN ASPART 100 UNIT/ML ~~LOC~~ SOLN
0.0000 [IU] | Freq: Three times a day (TID) | SUBCUTANEOUS | Status: DC
  Administered 2018-04-20: 1 [IU] via SUBCUTANEOUS

## 2018-04-19 MED ORDER — HYDRALAZINE HCL 10 MG PO TABS
10.0000 mg | ORAL_TABLET | Freq: Two times a day (BID) | ORAL | Status: DC
  Administered 2018-04-19 – 2018-04-20 (×4): 10 mg via ORAL
  Filled 2018-04-19 (×4): qty 1

## 2018-04-19 MED ORDER — LEVOTHYROXINE SODIUM 25 MCG PO TABS
125.0000 ug | ORAL_TABLET | Freq: Every day | ORAL | Status: DC
  Administered 2018-04-19 – 2018-04-20 (×2): 125 ug via ORAL
  Filled 2018-04-19 (×2): qty 1

## 2018-04-19 MED ORDER — TICAGRELOR 90 MG PO TABS
90.0000 mg | ORAL_TABLET | Freq: Two times a day (BID) | ORAL | Status: DC
  Administered 2018-04-19 – 2018-04-20 (×4): 90 mg via ORAL
  Filled 2018-04-19 (×4): qty 1

## 2018-04-19 MED ORDER — ASPIRIN EC 81 MG PO TBEC
81.0000 mg | DELAYED_RELEASE_TABLET | Freq: Every day | ORAL | Status: DC
  Administered 2018-04-19 – 2018-04-20 (×2): 81 mg via ORAL
  Filled 2018-04-19 (×2): qty 1

## 2018-04-19 MED ORDER — AMLODIPINE BESYLATE 5 MG PO TABS
10.0000 mg | ORAL_TABLET | Freq: Every day | ORAL | Status: DC
  Administered 2018-04-19 – 2018-04-20 (×2): 10 mg via ORAL
  Filled 2018-04-19 (×2): qty 2

## 2018-04-19 MED ORDER — ALBUTEROL SULFATE (2.5 MG/3ML) 0.083% IN NEBU: 3.0000 mL | INHALATION_SOLUTION | Freq: Four times a day (QID) | RESPIRATORY_TRACT | Status: DC | PRN

## 2018-04-19 MED ORDER — INSULIN ASPART PROT & ASPART (70-30 MIX) 100 UNIT/ML ~~LOC~~ SUSP
24.0000 [IU] | Freq: Two times a day (BID) | SUBCUTANEOUS | Status: DC
  Administered 2018-04-19 – 2018-04-20 (×3): 24 [IU] via SUBCUTANEOUS
  Filled 2018-04-19: qty 10

## 2018-04-19 MED ORDER — PROMETHAZINE HCL 25 MG/ML IJ SOLN
12.5000 mg | INTRAMUSCULAR | Status: AC
  Administered 2018-04-19: 12.5 mg via INTRAVENOUS
  Filled 2018-04-19: qty 1

## 2018-04-19 NOTE — Progress Notes (Signed)
PROGRESS NOTE    Donna Howe  LPF:[id number] DOB: 19-Jun-1956 DOA: 04/18/2018 PCP: Rory Percy, MD    Brief Narrative:  62 year old female with a history of coronary artery disease with stents placed approximately 1 week prior, presents to the hospital with complaints of chest discomfort.  She was admitted to rule out underlying ACS.   Assessment & Plan:   Principal Problem:   Unstable angina (HCC) Active Problems:   Hyperlipidemia   Essential hypertension   Gout   Bradycardia   Diabetes mellitus with renal manifestation (HCC)   CAD (coronary artery disease)   Anemia   Hypothyroidism   1. Chest pain, atypical.  Cardiac enzymes have thus far been negative, but this is only been over a 6-hour timeframe.  Needs to have further cardiac enzymes drawn over a longer timeframe.  Continue observation. 2. Coronary artery disease.  Had PCI last week, but feels that current chest discomfort is different than the one she had last week.  Continue on aspirin, Lipitor and Brilinta.  Beta-blockers currently on hold due to bradycardia 3. Hyperlipidemia.  Continue statin 4. Hypertension.  Continue on amlodipine and hydralazine.  Beta-blockers currently on hold. 5. Gout.  Continue on colchicine 6. Diabetes.  Having episodes of hypoglycemia.  Monitor sliding scale.  She is on 70/30. 7. Hypothyroidism.  Continue on Synthroid. 8. Chronic kidney disease stage III-IV.  Creatinine is currently at baseline.   DVT prophylaxis: Lovenox Code Status: Full code Family Communication: Discussed with husband at the bedside Disposition Plan: Discharge home if no further symptoms by tomorrow morning   Consultants:   Cardiology  Procedures:     Antimicrobials:      Subjective: No chest pain this morning.  Feels better.  Objective: Vitals:   04/19/18 1500 04/19/18 1600 04/19/18 1630 04/19/18 1700  BP:  (!) 131/45 (!) 117/91   Pulse: (!) 49 (!) 46 (!) 48 (!) 55  Resp: 14 18 14 16   Temp:    (!) 97.5 F (36.4 C)   TempSrc:   Oral   SpO2: 97% 100% 100% 100%  Weight:      Height:        Intake/Output Summary (Last 24 hours) at 04/19/2018 1727 Last data filed at 04/19/2018 1600 Gross per 24 hour  Intake -  Output 500 ml  Net -500 ml   Filed Weights   04/19/18 0200  Weight: 93.3 kg (205 lb 11 oz)    Examination:  General exam: Appears calm and comfortable  Respiratory system: Clear to auscultation. Respiratory effort normal. Cardiovascular system: S1 & S2 heard, RRR. No JVD, murmurs, rubs, gallops or clicks. No pedal edema. Gastrointestinal system: Abdomen is nondistended, soft and nontender. No organomegaly or masses felt. Normal bowel sounds heard. Central nervous system: Alert and oriented. No focal neurological deficits. Extremities: Symmetric 5 x 5 power. Skin: No rashes, lesions or ulcers Psychiatry: Judgement and insight appear normal. Mood & affect appropriate.     Data Reviewed: I have personally reviewed following labs and imaging studies  CBC: Recent Labs  Lab 04/18/18 2155 04/19/18 0354  WBC 10.0 9.1  NEUTROABS 5.6  --   HGB 11.1* 11.0*  HCT 33.1* 33.4*  MCV 89.2 90.5  PLT 162 532   Basic Metabolic Panel: Recent Labs  Lab 04/18/18 2155 04/19/18 0354  NA 136 139  K 4.8 5.0  CL 108 109  CO2 21* 25  GLUCOSE 81 129*  BUN 50* 45*  CREATININE 1.91* 1.84*  CALCIUM 8.8* 8.8*  GFR: Estimated Creatinine Clearance: 37.2 mL/min (A) (by C-G formula based on SCr of 1.84 mg/dL (H)). Liver Function Tests: No results for input(s): AST, ALT, ALKPHOS, BILITOT, PROT, ALBUMIN in the last 168 hours. No results for input(s): LIPASE, AMYLASE in the last 168 hours. No results for input(s): AMMONIA in the last 168 hours. Coagulation Profile: No results for input(s): INR, PROTIME in the last 168 hours. Cardiac Enzymes: Recent Labs  Lab 04/18/18 2155 04/19/18 0354 04/19/18 0935  TROPONINI <0.03 <0.03 <0.03   BNP (last 3 results) No results for  input(s): PROBNP in the last 8760 hours. HbA1C: No results for input(s): HGBA1C in the last 72 hours. CBG: Recent Labs  Lab 04/19/18 0114 04/19/18 1714  GLUCAP 100* 62*   Lipid Profile: No results for input(s): CHOL, HDL, LDLCALC, TRIG, CHOLHDL, LDLDIRECT in the last 72 hours. Thyroid Function Tests: No results for input(s): TSH, T4TOTAL, FREET4, T3FREE, THYROIDAB in the last 72 hours. Anemia Panel: No results for input(s): VITAMINB12, FOLATE, FERRITIN, TIBC, IRON, RETICCTPCT in the last 72 hours. Sepsis Labs: No results for input(s): PROCALCITON, LATICACIDVEN in the last 168 hours.  No results found for this or any previous visit (from the past 240 hour(s)).       Radiology Studies: Dg Chest 2 View  Result Date: 04/18/2018 CLINICAL DATA:  Chest pain.  Weakness for 2 days. EXAM: CHEST - 2 VIEW COMPARISON:  Radiograph 04/04/2018 FINDINGS: Post median sternotomy. Stable mild cardiomegaly, unchanged mediastinal contours. Coronary stent is visualized. Pulmonary vasculature is normal. No consolidation, pleural effusion, or pneumothorax. No acute osseous abnormalities are seen. IMPRESSION: 1. No acute findings. 2. Stable mild cardiomegaly. Electronically Signed   By: Jeb Levering M.D.   On: 04/18/2018 21:48        Scheduled Meds: . amLODipine  10 mg Oral Daily  . aspirin EC  81 mg Oral Daily  . atorvastatin  80 mg Oral Daily  . colchicine  0.6 mg Oral Daily  . enoxaparin (LOVENOX) injection  40 mg Subcutaneous Q24H  . gabapentin  400 mg Oral QHS  . hydrALAZINE  10 mg Oral BID  . [START ON 04/20/2018] insulin aspart  0-9 Units Subcutaneous TID WC  . insulin aspart protamine- aspart  24 Units Subcutaneous BID WC  . levothyroxine  125 mcg Oral QAC breakfast  . promethazine  12.5 mg Intravenous NOW  . ticagrelor  90 mg Oral BID   Continuous Infusions:   LOS: 0 days    Time spent: 52mins    Kathie Dike, MD [facility] Pager [phone]  If 7PM-7AM,  please contact night-coverage [email or web address] Password [facility] 04/19/2018, 5:27 PM

## 2018-04-19 NOTE — Care Management Obs Status (Signed)
[place] NOTIFICATION   Patient Details  Name: Donna Howe MRN: [id number] Date of Birth: Jun 13, 1956   Medicare Observation Status Notification Given:  Yes    Fredick Schlosser, Chauncey [place], RN 04/19/2018, 2:02 PM

## 2018-04-19 NOTE — Progress Notes (Signed)
c/o N/V Zofran given IV @1526  not effective. vomited moderate amount green undigested food @ 1600. BG 62; temp 97.5 oral. Pt states she believes nausea caused by lunch she ate today. Sprite and saltine crackers given. Pt still complains of nausea. Dr. Roderic Palau notified.

## 2018-04-19 NOTE — Consult Note (Signed)
Cardiology Consultation:   Patient ID: Donna Howe; [id number]; 12-Jan-1956   Admit date: 04/18/2018 Date of Consult: 04/19/2018  Primary Care Provider: Rory Percy, MD Primary Cardiologist: Rozann Lesches, MD  Primary Electrophysiologist:  na   Patient Profile:   Donna Howe is a 62 y.o. female with a hx of of CAD s/p CABG 2005 with numerous PCIs since then (last in 03/2018), CKD stage III, anemia, asthma, HTN, HLD, hypothyroidism, gout, PAD (prior R CEA 2014, was followed by [facility]), SBO s/p prior LOA, thrombocytopenia, DM, bradycardia (not on BB due to this) who is being seen today for the evaluation of chest pain at the request of Dr Roderic Palau.     History of Present Illness:   [name] is a 62 yo female with extensive CAD history including CABG in 2005 and multiple PCIs. Admission 03/2018 with chest pain. Cath showed patent LM, LAD 80%, LCX 50%, OM1 80%, RCA 90%. LIMA-LAD origin 60%, SVG-OM2 occldued, patent SVG-RCA.   Discussions were had about redo CABG vs PCI. Patient ultiamtely had DES to SVG-RCA graft, and DES to origin LIMA-LAD origin lesion. Echo during that admission showed LVEF 60-65%.   Reports doing well initially after discharge. 2 days ago noticed some nonspecific fatigue. Yesterday starting midday had onset of chest pain at rest. Different from her prior anginal symptoms. Felt like a pressing feeling left chest 8/10, no other associated symptoms. Not better with NG. Not positional, not related to food. Lasted constant nearly 12 hours, though varied in severity. Resolved when she awoke this morning after 6AM.    Cr 1.9, BUN 50, WBC 10, Hgb 11.1, Plt 162, BNP 295,  Trop neg x 3 over 6 hr period.  CXR no acute process EKG SR, lateral TWIs   Past Medical History:  Diagnosis Date  . Anemia   . Asthma   . CAD (coronary artery disease)    Multivessel s/p CABG 2005, numerous PCIs since that time  . Carotid artery disease (HCC)    R CEA  . Chronic kidney disease  (CKD), stage III (moderate) (HCC)   . Essential hypertension   . Gout   . Hyperlipidemia   . Hypothyroidism   . Myocardial infarction ([place])   . PAD (peripheral artery disease) ([place])    Dr. Kellie Simmering  . S/P angioplasty with stent- DES to [facility] and to LIMA to LAD with DES 04/09/18.   04/10/2018  . SBO (small bowel obstruction) ([place]) 2011   Status post lysis of adhesions & hernia repair  . Sinus bradycardia   . Thrombocytopenia, unspecified ([place])   . Type 2 diabetes mellitus ([place])   . Umbilical hernia     Past Surgical History:  Procedure Laterality Date  . CESAREAN SECTION  1984  . CHOLECYSTECTOMY  2010  . CORONARY ARTERY BYPASS GRAFT  2005  . CORONARY BALLOON ANGIOPLASTY N/A 05/31/2017   Procedure: CORONARY BALLOON ANGIOPLASTY;  Surgeon: Jettie Booze, MD;  Location: [place] [facility];  Service: Cardiovascular;  Laterality: N/A;  . CORONARY STENT INTERVENTION N/A 05/31/2017   Procedure: CORONARY STENT INTERVENTION;  Surgeon: Jettie Booze, MD;  Location: [place] [facility];  Service: Cardiovascular;  Laterality: N/A;  . CORONARY STENT INTERVENTION N/A 04/09/2018   Procedure: CORONARY STENT INTERVENTION;  Surgeon: Jettie Booze, MD;  Location: [place] [facility];  Service: Cardiovascular;  Laterality: N/A;  SVG RCA  . ENDARTERECTOMY Right 04/18/2013   Procedure: ENDARTERECTOMY CAROTID;  Surgeon: Mal Misty, MD;  [facility]  OR;  Service: Vascular;  Laterality: Right;  . HERNIA REPAIR  1989  . Incisional hernia repair x2  03/04/2010   Laparoscopic with 35cm mesh by Dr Ronnald Collum  . LEFT HEART CATH AND CORS/GRAFTS ANGIOGRAPHY N/A 05/31/2017   Procedure: LEFT HEART CATH AND CORS/GRAFTS ANGIOGRAPHY;  Surgeon: Jettie Booze, MD;  Location: [place] [facility];  Service: Cardiovascular;  Laterality: N/A;  . LEFT HEART CATH AND CORS/GRAFTS ANGIOGRAPHY N/A 04/08/2018   Procedure: LEFT HEART CATH AND CORS/GRAFTS ANGIOGRAPHY;  Surgeon: Jettie Booze, MD;  Location:  [place] [facility];  Service: Cardiovascular;  Laterality: N/A;  . LEFT HEART CATHETERIZATION WITH CORONARY ANGIOGRAM N/A 12/19/2012   Procedure: LEFT HEART CATHETERIZATION WITH CORONARY ANGIOGRAM;  Surgeon: Josue Hector, MD;  Location: [facility] CATH LAB;  Service: Cardiovascular;  Laterality: N/A;  . LEFT HEART CATHETERIZATION WITH CORONARY/GRAFT ANGIOGRAM N/A 04/19/2013   Procedure: LEFT HEART CATHETERIZATION WITH Beatrix Fetters;  Surgeon: Lorretta Harp, MD;  Location: [facility] CATH LAB;  Service: Cardiovascular;  Laterality: N/A;  . PATCH ANGIOPLASTY Right 04/18/2013   Procedure: PATCH ANGIOPLASTY Right Internal Carotid Artery;  Surgeon: Mal Misty, MD;  Location: [place];  Service: Vascular;  Laterality: Right;  . PERCUTANEOUS CORONARY STENT INTERVENTION (PCI-S) Right 12/19/2012   Procedure: PERCUTANEOUS CORONARY STENT INTERVENTION (PCI-S);  Surgeon: Josue Hector, MD;  Location: [facility] CATH LAB;  Service: Cardiovascular;  Laterality: Right;  . SHOULDER SURGERY        Inpatient Medications: Scheduled Meds: . amLODipine  10 mg Oral Daily  . aspirin EC  81 mg Oral Daily  . atorvastatin  80 mg Oral Daily  . colchicine  0.6 mg Oral Daily  . enoxaparin (LOVENOX) injection  40 mg Subcutaneous Q24H  . gabapentin  400 mg Oral QHS  . hydrALAZINE  10 mg Oral BID  . insulin aspart protamine- aspart  24 Units Subcutaneous BID WC  . levothyroxine  125 mcg Oral QAC breakfast  . ticagrelor  90 mg Oral BID   Continuous Infusions:  PRN Meds: acetaminophen, albuterol, ALPRAZolam, morphine injection, ondansetron (ZOFRAN) IV  Allergies:    Allergies  Allergen Reactions  . Penicillins Other (See Comments)    REACTION: Unknown, told as a child Has patient had a PCN reaction causing immediate rash, facial/tongue/throat swelling, SOB or lightheadedness with hypotension: Unknown Has patient had a PCN reaction causing severe rash involving mucus membranes or skin necrosis: Unknown Has patient had a PCN  reaction that required hospitalization: Unknown Has patient had a PCN reaction occurring within the last 10 years: No If all of the above answers are "NO", then may proceed with Cephalosporin use.     Social History:   Social History   Socioeconomic History  . Marital status: Divorced    Spouse name: Not on file  . Number of children: Not on file  . Years of education: Not on file  . Highest education level: Not on file  Occupational History  . Occupation: Disabled  Social Needs  . Financial resource strain: Not on file  . Food insecurity:    Worry: Not on file    Inability: Not on file  . Transportation needs:    Medical: Not on file    Non-medical: Not on file  Tobacco Use  . Smoking status: Former Smoker    Packs/day: 1.00    Years: 20.00    Pack years: 20.00    Types: Cigarettes    Last attempt to quit: 12/10/2012  Years since quitting: 5.3  . Smokeless tobacco: Never Used  Substance and Sexual Activity  . Alcohol use: No    Alcohol/week: 0.0 oz  . Drug use: No  . Sexual activity: Not Currently  Lifestyle  . Physical activity:    Days per week: Not on file    Minutes per session: Not on file  . Stress: Not on file  Relationships  . Social connections:    Talks on phone: Not on file    Gets together: Not on file    Attends religious service: Not on file    Active member of club or organization: Not on file    Attends meetings of clubs or organizations: Not on file    Relationship status: Not on file  . Intimate partner violence:    Fear of current or ex partner: Not on file    Emotionally abused: Not on file    Physically abused: Not on file    Forced sexual activity: Not on file  Other Topics Concern  . Not on file  Social History Narrative   Lives with mother.    Family History:    Family History  Problem Relation Age of Onset  . Diabetes Mother   . Heart disease Mother        before age 42  . Hyperlipidemia Mother   . Hypertension Mother     . Thyroid disease Father   . Hypertension Father   . AAA (abdominal aortic aneurysm) Father   . Heart disease Brother        before age 56  . Hypertension Brother   . Hyperlipidemia Son   . Hypertension Son      ROS:  Please see the history of present illness.  All other ROS reviewed and negative.     Physical Exam/Data:   Vitals:   04/19/18 0630 04/19/18 0700 04/19/18 0735 04/19/18 0800  BP: (!) 88/45 (!) 105/49  (!) 104/53  Pulse: (!) 43 (!) 44 (!) 44 (!) 43  Resp: 13 12 (!) 9 12  Temp:   97.6 F (36.4 C)   TempSrc:   Oral   SpO2: 95% 95% 95% 97%  Weight:      Height:       No intake or output data in the 24 hours ending 04/19/18 0906 Filed Weights   04/19/18 0200  Weight: 205 lb 11 oz (93.3 kg)   Body mass index is 34.23 kg/m.  General:  Well nourished, well developed, in no acute distress HEENT: normal Lymph: no adenopathy Neck: no JVD Endocrine:  No thryomegaly Cardiac:  normal S1, S2; RRR; no murmur Lungs:  clear to auscultation bilaterally, no wheezing, rhonchi or rales  Abd: soft, nontender, no hepatomegaly  Ext: no edema Musculoskeletal:  No deformities, BUE and BLE strength normal and equal Skin: warm and dry  Neuro:  CNs 2-12 intact, no focal abnormalities noted Psych:  Normal affect    Laboratory Data:  Chemistry Recent Labs  Lab 04/18/18 2155 04/19/18 0354  NA 136 139  K 4.8 5.0  CL 108 109  CO2 21* 25  GLUCOSE 81 129*  BUN 50* 45*  CREATININE 1.91* 1.84*  CALCIUM 8.8* 8.8*  GFRNONAA 28* 29*  GFRAA 32* 34*  ANIONGAP 7 5    No results for input(s): PROT, ALBUMIN, AST, ALT, ALKPHOS, BILITOT in the last 168 hours. Hematology Recent Labs  Lab 04/18/18 2155 04/19/18 0354  WBC 10.0 9.1  RBC 3.71* 3.69*  HGB 11.1*  11.0*  HCT 33.1* 33.4*  MCV 89.2 90.5  MCH 29.9 29.8  MCHC 33.5 32.9  RDW 13.3 13.3  PLT 162 150   Cardiac Enzymes Recent Labs  Lab 04/18/18 2155 04/19/18 0354  TROPONINI <0.03 <0.03    Recent Labs  Lab  04/18/18 2200  TROPIPOC 0.00    BNP Recent Labs  Lab 04/18/18 2155  BNP 295.0*    DDimer No results for input(s): DDIMER in the last 168 hours.  Radiology/Studies:  Dg Chest 2 View  Result Date: 04/18/2018 CLINICAL DATA:  Chest pain.  Weakness for 2 days. EXAM: CHEST - 2 VIEW COMPARISON:  Radiograph 04/04/2018 FINDINGS: Post median sternotomy. Stable mild cardiomegaly, unchanged mediastinal contours. Coronary stent is visualized. Pulmonary vasculature is normal. No consolidation, pleural effusion, or pneumothorax. No acute osseous abnormalities are seen. IMPRESSION: 1. No acute findings. 2. Stable mild cardiomegaly. Electronically Signed   By: Jeb Levering M.D.   On: 04/18/2018 21:48    Assessment and Plan:   1. CAD - complex history as outlined above with PCI last week - presents with chest pain different from her prior angina. Atypical in the fact it lasted constant nearly 12 hours. Trops negative x 3 but only over a 6 hour period, EKG without acute ischemic changes.  - medical therapy with ASA 81, atorva 80, brillinta. Beta blocker on hold due to bradycardia. NO ACE/ARB due to poor renal function  - would monitor today and continue cycling enzymes since admitted last night and trops neg only over 6 hr period. If negative enzymes and remains symptom free would plan for discharge tomorrow AM.  - continue to hold beta blocker due to bradycardia, can try lower dose 12.5mg  bid when restarting.    2. AKI - per primary team    For questions or updates, please contact [place] Please consult [email or web address] for contact info under Cardiology/STEMI.   Merrily Pew, MD  04/19/2018 9:06 AM

## 2018-04-19 NOTE — Progress Notes (Signed)
Hypoglycemic Event  CBG: 62 @1715   Treatment: half amp D50 @ 1745  Symptoms: Nausea, vomiting  Follow-up CBG: Time 1800 CBG Result:96  Possible Reasons for Event: vomiting, 70/30 administer in morning   Comments/MD notified: Dr. Juanda Chance

## 2018-04-20 DIAGNOSIS — I2510 Atherosclerotic heart disease of native coronary artery without angina pectoris: Secondary | ICD-10-CM | POA: Diagnosis not present

## 2018-04-20 DIAGNOSIS — R079 Chest pain, unspecified: Secondary | ICD-10-CM | POA: Diagnosis not present

## 2018-04-20 DIAGNOSIS — Z794 Long term (current) use of insulin: Secondary | ICD-10-CM | POA: Diagnosis not present

## 2018-04-20 DIAGNOSIS — E1122 Type 2 diabetes mellitus with diabetic chronic kidney disease: Secondary | ICD-10-CM | POA: Diagnosis not present

## 2018-04-20 DIAGNOSIS — E039 Hypothyroidism, unspecified: Secondary | ICD-10-CM | POA: Diagnosis not present

## 2018-04-20 DIAGNOSIS — E782 Mixed hyperlipidemia: Secondary | ICD-10-CM | POA: Diagnosis not present

## 2018-04-20 DIAGNOSIS — I10 Essential (primary) hypertension: Secondary | ICD-10-CM | POA: Diagnosis not present

## 2018-04-20 DIAGNOSIS — N184 Chronic kidney disease, stage 4 (severe): Secondary | ICD-10-CM | POA: Diagnosis not present

## 2018-04-20 LAB — COMPREHENSIVE METABOLIC PANEL
ALBUMIN: 2.9 g/dL — AB (ref 3.5–5.0)
ALK PHOS: 76 U/L (ref 38–126)
ALT: 15 U/L (ref 0–44)
AST: 12 U/L — ABNORMAL LOW (ref 15–41)
Anion gap: 7 (ref 5–15)
BILIRUBIN TOTAL: 0.7 mg/dL (ref 0.3–1.2)
BUN: 56 mg/dL — ABNORMAL HIGH (ref 6–20)
CALCIUM: 8.6 mg/dL — AB (ref 8.9–10.3)
CO2: 25 mmol/L (ref 22–32)
CREATININE: 2.25 mg/dL — AB (ref 0.44–1.00)
Chloride: 107 mmol/L (ref 98–111)
GFR calc non Af Amer: 23 mL/min — ABNORMAL LOW (ref >60)
GFR, EST AFRICAN AMERICAN: 26 mL/min — AB (ref >60)
GLUCOSE: 130 mg/dL — AB (ref 70–99)
Potassium: 5.3 mmol/L — ABNORMAL HIGH (ref 3.5–5.1)
Sodium: 139 mmol/L (ref 135–145)
TOTAL PROTEIN: 6 g/dL — AB (ref 6.5–8.1)

## 2018-04-20 LAB — BASIC METABOLIC PANEL
Anion gap: 7 (ref 5–15)
BUN: 58 mg/dL — AB (ref 6–20)
CHLORIDE: 107 mmol/L (ref 98–111)
CO2: 24 mmol/L (ref 22–32)
CREATININE: 2.16 mg/dL — AB (ref 0.44–1.00)
Calcium: 8.7 mg/dL — ABNORMAL LOW (ref 8.9–10.3)
GFR calc Af Amer: 28 mL/min — ABNORMAL LOW (ref >60)
GFR calc non Af Amer: 24 mL/min — ABNORMAL LOW (ref >60)
GLUCOSE: 90 mg/dL (ref 70–99)
POTASSIUM: 4.9 mmol/L (ref 3.5–5.1)
SODIUM: 138 mmol/L (ref 135–145)

## 2018-04-20 LAB — GLUCOSE, CAPILLARY
GLUCOSE-CAPILLARY: 138 mg/dL — AB (ref 70–99)
Glucose-Capillary: 95 mg/dL (ref 70–99)
Glucose-Capillary: 102 mg/dL — ABNORMAL HIGH (ref 70–99)

## 2018-04-20 LAB — TROPONIN I: Troponin I: <0.03 ng/mL (ref <0.03)

## 2018-04-20 MED ORDER — LACTATED RINGERS IV SOLN
INTRAVENOUS | Status: DC
  Administered 2018-04-20: 12:00:00 via INTRAVENOUS

## 2018-04-20 MED ORDER — SODIUM CHLORIDE 0.45 % IV BOLUS
250.0000 mL | Freq: Once | INTRAVENOUS | Status: AC
  Administered 2018-04-20: 250 mL via INTRAVENOUS

## 2018-04-20 NOTE — Progress Notes (Signed)
Patient discharged home with instructions given on medications,and follow up visits,patient verbalized understanding. Vital signs stable. IV discontinued catheter intact. Accompanied by staff to awaiting vehicle.

## 2018-04-20 NOTE — Discharge Summary (Signed)
Physician Discharge Summary  Donna Howe OQH:[id number] DOB: [birth year] DOA: 04/18/2018  PCP: Rory Percy, MD  Admit date: 04/18/2018 Discharge date: 04/20/2018  Admitted From: home Disposition:  home  Recommendations for Outpatient Follow-up:  1. Follow up with PCP in 1-2 weeks 2. Please obtain BMP/CBC in one week 3. Patient is already scheduled follow-up with cardiology  Discharge Condition: Stable  CODE STATUS: Full code Diet recommendation: Heart healthy, carb modified  Brief/Interim Summary: 62 year old female with history of coronary artery disease with cardiac stents placed approximately 1 week prior to admission, presents to the hospital with complaints of chest discomfort.  Patient was admitted to the hospital where she ruled out for ACS with negative cardiac markers.  She was seen by cardiology and it was felt that her pain was somewhat atypical and was not consistent with her prior chest pain when she had an MI.  It was recommended to continue medical management with aspirin, Lipitor and Brilinta.  Beta-blockers have not been given because of bradycardia.  This can be further addressed as an outpatient.  She does have chronic kidney disease and creatinine is currently near baseline.  This will need to be followed as an outpatient.  Patient is feeling better, she does not have any further chest pain.  She is felt stable to discharge home.  Discharge Diagnoses:  Principal Problem:   Unstable angina (HCC) Active Problems:   Hyperlipidemia   Essential hypertension   Gout   Bradycardia   Diabetes mellitus with renal manifestation (HCC)   CAD (coronary artery disease)   Anemia   Hypothyroidism    Discharge Instructions  Discharge Instructions    Diet - low sodium heart healthy   Complete by:  As directed    Increase activity slowly   Complete by:  As directed      Allergies as of 04/20/2018      Reactions   Penicillins Other (See Comments)   REACTION: Unknown,  told as a child Has patient had a PCN reaction causing immediate rash, facial/tongue/throat swelling, SOB or lightheadedness with hypotension: Unknown Has patient had a PCN reaction causing severe rash involving mucus membranes or skin necrosis: Unknown Has patient had a PCN reaction that required hospitalization: Unknown Has patient had a PCN reaction occurring within the last 10 years: No If all of the above answers are "NO", then may proceed with Cephalosporin use.      Medication List    STOP taking these medications   metoprolol tartrate 25 MG tablet Commonly known as:  LOPRESSOR     TAKE these medications   ALPRAZolam 0.5 MG tablet Commonly known as:  XANAX Take 1/2 tab by mouth every morning & evening and 1 tab at bedtime   amLODipine 10 MG tablet Commonly known as:  NORVASC Take 1 tablet (10 mg total) by mouth daily.   aspirin EC 81 MG tablet Take 81 mg by mouth daily.   atorvastatin 80 MG tablet Commonly known as:  LIPITOR Take 1 tablet (80 mg total) by mouth daily.   colchicine 0.6 MG tablet Take 0.6 mg by mouth daily.   gabapentin 400 MG capsule Commonly known as:  NEURONTIN Take 400 mg by mouth at bedtime.   hydrALAZINE 10 MG tablet Commonly known as:  APRESOLINE Take 1 tablet (10 mg total) by mouth every 8 (eight) hours. What changed:  when to take this   isosorbide dinitrate 5 MG tablet Commonly known as:  ISORDIL Take 1 tablet (5 mg total) by  mouth 3 (three) times daily. What changed:  when to take this   levothyroxine 125 MCG tablet Commonly known as:  SYNTHROID, LEVOTHROID Take 125 mcg by mouth every morning.   nitroGLYCERIN 0.4 MG SL tablet Commonly known as:  NITROSTAT Place 0.4 mg under the tongue every 5 (five) minutes as needed for chest pain. Not to exceed 3 in 15 minute time frame   NOVOLIN 70/30 RELION (70-30) 100 UNIT/ML injection Generic drug:  insulin NPH-regular Human Inject 24 Units into the skin 2 (two) times daily with a  meal.   ondansetron 8 MG disintegrating tablet Commonly known as:  ZOFRAN ODT Take 1 tablet (8 mg total) by mouth every 8 (eight) hours as needed for nausea or vomiting.   ticagrelor 90 MG Tabs tablet Commonly known as:  BRILINTA Take 1 tablet (90 mg total) by mouth 2 (two) times daily.   VENTOLIN HFA 108 (90 Base) MCG/ACT inhaler Generic drug:  albuterol INHALE 2 PUFFS BY MOUTH 4 TIMES DAILY AS NEEDED FOR SHORTNESS OF BREATH       Allergies  Allergen Reactions  . Penicillins Other (See Comments)    REACTION: Unknown, told as a child Has patient had a PCN reaction causing immediate rash, facial/tongue/throat swelling, SOB or lightheadedness with hypotension: Unknown Has patient had a PCN reaction causing severe rash involving mucus membranes or skin necrosis: Unknown Has patient had a PCN reaction that required hospitalization: Unknown Has patient had a PCN reaction occurring within the last 10 years: No If all of the above answers are "NO", then may proceed with Cephalosporin use.     Consultations:  cardiology   Procedures/Studies: Dg Chest 2 View  Result Date: 04/18/2018 CLINICAL DATA:  Chest pain.  Weakness for 2 days. EXAM: CHEST - 2 VIEW COMPARISON:  Radiograph 04/04/2018 FINDINGS: Post median sternotomy. Stable mild cardiomegaly, unchanged mediastinal contours. Coronary stent is visualized. Pulmonary vasculature is normal. No consolidation, pleural effusion, or pneumothorax. No acute osseous abnormalities are seen. IMPRESSION: 1. No acute findings. 2. Stable mild cardiomegaly. Electronically Signed   By: Jeb Levering M.D.   On: 04/18/2018 21:48   Dg Chest 2 View  Result Date: 04/04/2018 CLINICAL DATA:  Chest pain EXAM: CHEST - 2 VIEW COMPARISON:  05/30/2017 FINDINGS: Mild cardiomegaly. Remote median sternotomy. Lungs are clear. No pleural effusion or pneumothorax. IMPRESSION: Mild cardiomegaly without active airspace disease. Electronically Signed   By: Ulyses Jarred M.D.   On: 04/04/2018 22:10       Subjective: No chest pain. Feeling better  Discharge Exam: Vitals:   04/20/18 1030 04/20/18 1450  BP: (!) 126/54 (!) 129/45  Pulse: (!) 58 (!) 57  Resp:  17  Temp:  98.2 F (36.8 C)  SpO2:  100%   Vitals:   04/19/18 2200 04/20/18 0521 04/20/18 1030 04/20/18 1450  BP: 117/70 102/68 (!) 126/54 (!) 129/45  Pulse:  (!) 48 (!) 58 (!) 57  Resp:  14  17  Temp: 99.4 F (37.4 C) 98.2 F (36.8 C)  98.2 F (36.8 C)  TempSrc: Oral Oral  Oral  SpO2:  97%  100%  Weight:      Height:        General: Pt is alert, awake, not in acute distress Cardiovascular: RRR, S1/S2 +, no rubs, no gallops Respiratory: CTA bilaterally, no wheezing, no rhonchi Abdominal: Soft, NT, ND, bowel sounds + Extremities: no edema, no cyanosis    The results of significant diagnostics from this hospitalization (including imaging, microbiology, ancillary  and laboratory) are listed below for reference.     Microbiology: No results found for this or any previous visit (from the past 240 hour(s)).   Labs: BNP (last 3 results) Recent Labs    04/18/18 2155  BNP 540.0*   Basic Metabolic Panel: Recent Labs  Lab 04/18/18 2155 04/19/18 0354 04/20/18 0553 04/20/18 1519  NA 136 139 139 138  K 4.8 5.0 5.3* 4.9  CL 108 109 107 107  CO2 21* 25 25 24   GLUCOSE 81 129* 130* 90  BUN 50* 45* 56* 58*  CREATININE 1.91* 1.84* 2.25* 2.16*  CALCIUM 8.8* 8.8* 8.6* 8.7*   Liver Function Tests: Recent Labs  Lab 04/20/18 0553  AST 12*  ALT 15  ALKPHOS 76  BILITOT 0.7  PROT 6.0*  ALBUMIN 2.9*   No results for input(s): LIPASE, AMYLASE in the last 168 hours. No results for input(s): AMMONIA in the last 168 hours. CBC: Recent Labs  Lab 04/18/18 2155 04/19/18 0354  WBC 10.0 9.1  NEUTROABS 5.6  --   HGB 11.1* 11.0*  HCT 33.1* 33.4*  MCV 89.2 90.5  PLT 162 150   Cardiac Enzymes: Recent Labs  Lab 04/18/18 2155 04/19/18 0354 04/19/18 0935 04/20/18 0553   TROPONINI <0.03 <0.03 <0.03 <0.03   BNP: Invalid input(s): POCBNP CBG: Recent Labs  Lab 04/19/18 1805 04/19/18 2106 04/19/18 2324 04/20/18 0826 04/20/18 1145  GLUCAP 96 152* 133* 95 138*   D-Dimer No results for input(s): DDIMER in the last 72 hours. Hgb A1c No results for input(s): HGBA1C in the last 72 hours. Lipid Profile No results for input(s): CHOL, HDL, LDLCALC, TRIG, CHOLHDL, LDLDIRECT in the last 72 hours. Thyroid function studies No results for input(s): TSH, T4TOTAL, T3FREE, THYROIDAB in the last 72 hours.  Invalid input(s): FREET3 Anemia work up No results for input(s): VITAMINB12, FOLATE, FERRITIN, TIBC, IRON, RETICCTPCT in the last 72 hours. Urinalysis    Component Value Date/Time   COLORURINE YELLOW 08/13/2015 1840   APPEARANCEUR HAZY (A) 08/13/2015 1840   LABSPEC 1.015 08/13/2015 1840   PHURINE 6.0 08/13/2015 1840   GLUCOSEU NEGATIVE 08/13/2015 1840   HGBUR NEGATIVE 08/13/2015 1840   BILIRUBINUR NEGATIVE 08/13/2015 1840   [place] 08/13/2015 1840   PROTEINUR 100 (A) 08/13/2015 1840   UROBILINOGEN 0.2 10/26/2013 2229   NITRITE NEGATIVE 08/13/2015 1840   LEUKOCYTESUR TRACE (A) 08/13/2015 1840   Sepsis Labs Invalid input(s): PROCALCITONIN,  WBC,  LACTICIDVEN Microbiology No results found for this or any previous visit (from the past 240 hour(s)).   Time coordinating discharge: 9mins  SIGNED:   Kathie Dike, MD  [facility] 04/20/2018, 4:23 PM Pager   If 7PM-7AM, please contact night-coverage [email or web address] Password [email or web address]

## 2018-05-03 ENCOUNTER — Ambulatory Visit (INDEPENDENT_AMBULATORY_CARE_PROVIDER_SITE_OTHER): Payer: Medicare HMO | Admitting: Student

## 2018-05-03 ENCOUNTER — Encounter: Payer: Self-pay | Admitting: Student

## 2018-05-03 VITALS — BP 156/82 | HR 64 | Ht 66.0 in | Wt 200.0 lb

## 2018-05-03 DIAGNOSIS — N183 Chronic kidney disease, stage 3 unspecified: Secondary | ICD-10-CM

## 2018-05-03 DIAGNOSIS — I2510 Atherosclerotic heart disease of native coronary artery without angina pectoris: Secondary | ICD-10-CM

## 2018-05-03 DIAGNOSIS — I10 Essential (primary) hypertension: Secondary | ICD-10-CM

## 2018-05-03 DIAGNOSIS — E785 Hyperlipidemia, unspecified: Secondary | ICD-10-CM

## 2018-05-03 MED ORDER — HYDRALAZINE HCL 25 MG PO TABS: 25.0000 mg | ORAL_TABLET | Freq: Two times a day (BID) | ORAL | 3 refills | Status: DC

## 2018-05-03 NOTE — Progress Notes (Signed)
Cardiology Office Note    Date:  05/04/2018   ID:  [name], DOB 1955/12/31, MRN [id number]  PCP:  Rory Percy, MD  Cardiologist: Rozann Lesches, MD    Chief Complaint  Patient presents with  . Hospitalization Follow-up    History of Present Illness:    Donna Howe is a 62 y.o. female with past medical history of CAD (s/p CABG in 2005 with multiple PCI's since including PTCA to SVG-OM1 in 01/2004, PTCA/DES to LM and prox LCx in 03/2004 and DES to SVG-OM1, PTCA/DES to SVG-OM in 01/2011, PTCA/DES to LM and LCx in 2015, and cutting balloon angioplasty to prox Cx and DES to SVG-RCA in 05/2017), HTN, HLD, Stage 3 CKD, PAD (s/p R CEA in 2014) and baseline bradycardia who presents to the office today for hospital follow-up.   She was recently admitted to [facility] on 04/04/2018 for evaluation of intermittent substernal chest discomfort and cyclic troponin values were found to be elevated, peaking at 0.23. Creatinine was above baseline upon admission, therefore she was started on IV Hydration over the weekend prior to her catheterization on 04/08/2018. This showed 60% stenosis along LIMA-LAD, 50% LCx, 70-80% stenosis along 1st Mrg, occluded SVG-OM, and 90% mid-RCA stenosis past the insertion of the graft. Staged PCI versus re-do CABG were reviewed and she went back to the cath lab on 04/09/2018 and underwent DES placement to the mid-RCA and DES placement to the LIMA-LAD. She was started on DAPT with ASA and Brilinta along with Amlodipine, Lopressor, Isordil, and Hydralazine. Lisinopril was discontinued given variable kidney function (creatinine 1.77 at the time of discharge).   She presented back to [facility] ED on 04/18/2018 for recurrent chest pain. Reported her symptoms did not resemble her prior anginal symptoms and has been constant for over 12 hours. Cyclic troponin values remained negative and EKG showed no acute ischemic changes. Given her atypical symptoms, no further testing was  pursued. Lopressor was discontinued given her bradycardia. Creatinine did peak at 2.25 during admission, trending down to 2.16 at the time of discharge.   In talking with the patient today, she reports overall doing well from a cardiac perspective since her recent hospitalization. She has noted that SBP has been elevated in the 150's to 160's at home and equated this due to the increased stress she has been under in regards to her health and the recent passing away of several family members within a few months. She denies any repeat episodes of chest pain similar to the day of admission.  Denies any dyspnea on exertion, orthopnea, PND, or lower extremity edema.  She has only been taking Hydralazine and Isosorbide twice daily due to waking up later in the day.   Past Medical History:  Diagnosis Date  . Anemia   . Asthma   . CAD (coronary artery disease)    Multivessel s/p CABG 2005, numerous PCIs since that time  . Carotid artery disease (HCC)    R CEA  . Chronic kidney disease (CKD), stage III (moderate) (HCC)   . Essential hypertension   . Gout   . Hyperlipidemia   . Hypothyroidism   . Myocardial infarction ([place])   . PAD (peripheral artery disease) ([place])    Dr. Kellie Simmering  . S/P angioplasty with stent- DES to [facility] and to LIMA to LAD with DES 04/09/18.   04/10/2018  . SBO (small bowel obstruction) ([place]) 2011   Status post lysis of adhesions & hernia repair  . Sinus  bradycardia   . Thrombocytopenia, unspecified ([place])   . Type 2 diabetes mellitus ([place])   . Umbilical hernia     Past Surgical History:  Procedure Laterality Date  . CESAREAN SECTION  1984  . CHOLECYSTECTOMY  2010  . CORONARY ARTERY BYPASS GRAFT  2005  . CORONARY BALLOON ANGIOPLASTY N/A 05/31/2017   Procedure: CORONARY BALLOON ANGIOPLASTY;  Surgeon: Jettie Booze, MD;  Location: [place] CV LAB;  Service: Cardiovascular;  Laterality: N/A;  . CORONARY STENT INTERVENTION N/A 05/31/2017   Procedure: CORONARY STENT  INTERVENTION;  Surgeon: Jettie Booze, MD;  Location: [place] [facility];  Service: Cardiovascular;  Laterality: N/A;  . CORONARY STENT INTERVENTION N/A 04/09/2018   Procedure: CORONARY STENT INTERVENTION;  Surgeon: Jettie Booze, MD;  Location: [place] [facility];  Service: Cardiovascular;  Laterality: N/A;  SVG RCA  . ENDARTERECTOMY Right 04/18/2013   Procedure: ENDARTERECTOMY CAROTID;  Surgeon: Mal Misty, MD;  Location: [place];  Service: Vascular;  Laterality: Right;  . HERNIA REPAIR  1989  . Incisional hernia repair x2  03/04/2010   Laparoscopic with 35cm mesh by Dr Ronnald Collum  . LEFT HEART CATH AND CORS/GRAFTS ANGIOGRAPHY N/A 05/31/2017   Procedure: LEFT HEART CATH AND CORS/GRAFTS ANGIOGRAPHY;  Surgeon: Jettie Booze, MD;  Location: [place] [facility] LAB;  Service: Cardiovascular;  Laterality: N/A;  . LEFT HEART CATH AND CORS/GRAFTS ANGIOGRAPHY N/A 04/08/2018   Procedure: LEFT HEART CATH AND CORS/GRAFTS ANGIOGRAPHY;  Surgeon: Jettie Booze, MD;  Location: [place] [facility];  Service: Cardiovascular;  Laterality: N/A;  . LEFT HEART CATHETERIZATION WITH CORONARY ANGIOGRAM N/A 12/19/2012   Procedure: LEFT HEART CATHETERIZATION WITH CORONARY ANGIOGRAM;  Surgeon: Josue Hector, MD;  Location: [facility] CATH LAB;  Service: Cardiovascular;  Laterality: N/A;  . LEFT HEART CATHETERIZATION WITH CORONARY/GRAFT ANGIOGRAM N/A 04/19/2013   Procedure: LEFT HEART CATHETERIZATION WITH Beatrix Fetters;  Surgeon: Lorretta Harp, MD;  Location: [facility] CATH LAB;  Service: Cardiovascular;  Laterality: N/A;  . PATCH ANGIOPLASTY Right 04/18/2013   Procedure: PATCH ANGIOPLASTY Right Internal Carotid Artery;  Surgeon: Mal Misty, MD;  Location: [place];  Service: Vascular;  Laterality: Right;  . PERCUTANEOUS CORONARY STENT INTERVENTION (PCI-S) Right 12/19/2012   Procedure: PERCUTANEOUS CORONARY STENT INTERVENTION (PCI-S);  Surgeon: Josue Hector, MD;  Location: [facility] CATH LAB;  Service:  Cardiovascular;  Laterality: Right;  . SHOULDER SURGERY      Current Medications: Outpatient Medications Prior to Visit  Medication Sig Dispense Refill  . ALPRAZolam (XANAX) 0.5 MG tablet Take 1/2 tab by mouth every morning & evening and 1 tab at bedtime    . amLODipine (NORVASC) 10 MG tablet Take 1 tablet (10 mg total) by mouth daily. 30 tablet 6  . aspirin EC 81 MG tablet Take 81 mg by mouth daily.    Marland Kitchen atorvastatin (LIPITOR) 80 MG tablet Take 1 tablet (80 mg total) by mouth daily. 30 tablet 0  . colchicine 0.6 MG tablet Take 0.6 mg by mouth daily.     Marland Kitchen gabapentin (NEURONTIN) 400 MG capsule Take 400 mg by mouth at bedtime.     . isosorbide dinitrate (ISORDIL) 5 MG tablet Take 5 mg by mouth 2 (two) times daily.    Marland Kitchen levothyroxine (SYNTHROID, LEVOTHROID) 125 MCG tablet Take 125 mcg by mouth every morning.    . nitroGLYCERIN (NITROSTAT) 0.4 MG SL tablet Place 0.4 mg under the tongue every 5 (five) minutes as needed for chest pain. Not to exceed  3 in 15 minute time frame    . NOVOLIN 70/30 RELION (70-30) 100 UNIT/ML injection Inject 24 Units into the skin 2 (two) times daily with a meal.     . ondansetron (ZOFRAN ODT) 8 MG disintegrating tablet Take 1 tablet (8 mg total) by mouth every 8 (eight) hours as needed for nausea or vomiting. 20 tablet 0  . ticagrelor (BRILINTA) 90 MG TABS tablet Take 1 tablet (90 mg total) by mouth 2 (two) times daily. 60 tablet 10  . VENTOLIN HFA 108 (90 Base) MCG/ACT inhaler INHALE 2 PUFFS BY MOUTH 4 TIMES DAILY AS NEEDED FOR SHORTNESS OF BREATH  12  . hydrALAZINE (APRESOLINE) 10 MG tablet Take 1 tablet (10 mg total) by mouth every 8 (eight) hours. (Patient taking differently: Take 10 mg by mouth 2 (two) times daily. ) 90 tablet 6  . isosorbide dinitrate (ISORDIL) 5 MG tablet Take 1 tablet (5 mg total) by mouth 3 (three) times daily. (Patient taking differently: Take 5 mg by mouth 2 (two) times daily. ) 90 tablet 6   No facility-administered medications prior to  visit.      Allergies:   Penicillins   Social History   Socioeconomic History  . Marital status: Divorced    Spouse name: Not on file  . Number of children: Not on file  . Years of education: Not on file  . Highest education level: Not on file  Occupational History  . Occupation: Disabled  Social Needs  . Financial resource strain: Not on file  . Food insecurity:    Worry: Not on file    Inability: Not on file  . Transportation needs:    Medical: Not on file    Non-medical: Not on file  Tobacco Use  . Smoking status: Former Smoker    Packs/day: 1.00    Years: 20.00    Pack years: 20.00    Types: Cigarettes    Last attempt to quit: 12/10/2012    Years since quitting: 5.4  . Smokeless tobacco: Never Used  Substance and Sexual Activity  . Alcohol use: No    Alcohol/week: 0.0 standard drinks  . Drug use: No  . Sexual activity: Not Currently  Lifestyle  . Physical activity:    Days per week: Not on file    Minutes per session: Not on file  . Stress: Not on file  Relationships  . Social connections:    Talks on phone: Not on file    Gets together: Not on file    Attends religious service: Not on file    Active member of club or organization: Not on file    Attends meetings of clubs or organizations: Not on file    Relationship status: Not on file  Other Topics Concern  . Not on file  Social History Narrative   Lives with mother.     Family History:  The patient's family history includes AAA (abdominal aortic aneurysm) in her father; Diabetes in her mother; Heart disease in her brother and mother; Hyperlipidemia in her mother and son; Hypertension in her brother, father, mother, and son; Thyroid disease in her father.   Review of Systems:   Please see the history of present illness.     General:  No chills, fever, night sweats or weight changes. Positive for fatigue (improving).  Cardiovascular:  No chest pain, dyspnea on exertion, edema, orthopnea, palpitations,  paroxysmal nocturnal dyspnea. Dermatological: No rash, lesions/masses Respiratory: No cough, dyspnea Urologic: No hematuria, dysuria Abdominal:  No nausea, vomiting, diarrhea, bright red blood per rectum, melena, or hematemesis Neurologic:  No visual changes, wkns, changes in mental status. All other systems reviewed and are otherwise negative except as noted above.   Physical Exam:    VS:  BP (!) 156/82 (BP Location: Right Arm)   Pulse 64   Ht 5\' 6"  (1.676 m)   Wt 200 lb (90.7 kg)   SpO2 98%   BMI 32.28 kg/m    General: Well developed, well nourished Caucasian female appearing in no acute distress. Head: Normocephalic, atraumatic, sclera non-icteric, no xanthomas, nares are without discharge.  Neck: No carotid bruits. JVD not elevated.  Lungs: Respirations regular and unlabored, without wheezes or rales.  Heart: Regular rate and rhythm. No S3 or S4.  No murmur, no rubs, or gallops appreciated. Abdomen: Soft, non-tender, non-distended with normoactive bowel sounds. No hepatomegaly. No rebound/guarding. No obvious abdominal masses. Msk:  Strength and tone appear normal for age. No joint deformities or effusions. Extremities: No clubbing or cyanosis. No lower extremity edema.  Distal pedal pulses are 2+ bilaterally. Neuro: Alert and oriented X 3. Moves all extremities spontaneously. No focal deficits noted. Psych:  Responds to questions appropriately with a normal affect. Skin: No rashes or lesions noted  Wt Readings from Last 3 Encounters:  05/03/18 200 lb (90.7 kg)  04/19/18 205 lb 11 oz (93.3 kg)  04/10/18 198 lb (89.8 kg)     Studies/Labs Reviewed:   EKG:  EKG is not ordered today. EKG from 04/18/2018 is reviewed which shows sinus bradycardia, HR 48, with TWI along lateral leads.   Recent Labs: 05/31/2017: TSH 9.088 04/18/2018: B Natriuretic Peptide 295.0 04/19/2018: Hemoglobin 11.0; Platelets 150 04/20/2018: ALT 15; BUN 58; Creatinine, Ser 2.16; Potassium 4.9; Sodium 138    Lipid Panel    Component Value Date/Time   CHOL 239 (H) 05/31/2017 0743   TRIG 193 (H) 05/31/2017 0743   HDL 35 (L) 05/31/2017 0743   CHOLHDL 6.8 05/31/2017 0743   VLDL 39 05/31/2017 0743   LDLCALC 165 (H) 05/31/2017 0743    Additional studies/ records that were reviewed today include:   Cardiac Catheterization: 04/08/2018  Non-stenotic Ost LM to LM lesion previously treated.  Ost LAD to Prox LAD lesion is 80% stenosed. LIMA to LAD is patent with a proximal 60% lesion.  Non-stenotic Ost Cx to Mid Cx lesion previously treated.  Prox Cx to Mid Cx lesion is 50% stenosed.  Balloon angioplasty was performed.  1st Mrg-1 lesion is 70% stenosed.  Ost 1st Mrg lesion is 80% stenosed.  1st Mrg-2 lesion is 10% stenosed.  Origin to Prox Graft lesion is 100% stenosed. SVG to OM is occluded.  Previously placed Origin drug eluting stent is widely patent.  Balloon angioplasty was performed.  Mid RCA lesion is 90% stenosed. THis is past the insertion of the graft.  LV end diastolic pressure is mildly elevated.  There is no aortic valve stenosis.  Origin lesion is 60% stenosed.    Recommend dual antiplatelet therapy with Aspirin 81mg  daily and Clopidogrel 75mg  daily long-term (beyond 12 months) because of extensive CAD, and multiple stents..   Will look into repeat PCI attempt tomorrow if Cr is ok.  Could consider redo CABG eval as well given extensive circ disease and now two lesions in the SVG to RCA in the last 12 months, proximal LIMA lesion.  Will discuss with interventional colleagues.   Coronary Stent Intervention: 04/09/2018  Non-stenotic Ost LM to LM lesion previously treated.  Ost LAD to Prox LAD lesion is 80% stenosed.  Non-stenotic Ost Cx to Mid Cx lesion previously treated.  1st Mrg-1 lesion is 70% stenosed.  Ost 1st Mrg lesion is 80% stenosed.  1st Mrg-2 lesion is 10% stenosed.  Prox Cx to Mid Cx lesion is 50% stenosed.  Patent stent in the ostium of  the SVG to the RCA  Mid RCA lesion is 90% stenosed.  A drug-eluting stent was successfully placed using a STENT SIERRA 2.50 X 15 MM, through the SVG to RCA graft.  Post intervention, there is a 0% residual stenosis.  Origin lesion is 70% stenosed of the LIMA to LAD.  A drug-eluting stent was successfully placed using a STENT SYNERGY DES 3X12, postdilated to 3.25 mm.  Post intervention, there is a 0% residual stenosis.    Recommend uninterrupted dual antiplatelet therapy with Aspirin 81mg  daily and Ticagrelor 90mg  twice daily for a minimum of 12 months (ACS - Class I recommendation).   Continue aggressive secondary prevention.     Assessment:    1. Coronary artery disease involving native coronary artery of native heart without angina pectoris   2. Essential hypertension   3. Hyperlipidemia LDL goal <70   4. CKD (chronic kidney disease) stage 3, GFR 30-59 ml/min (HCC)      Plan:   In order of problems listed above:  1. CAD - s/p CABG in 2005 with multiple PCI's since including PTCA to SVG-OM1 in 01/2004, PTCA/DES to LM and prox LCx in 03/2004 and DES to SVG-OM1, PTCA/DES to SVG-OM in 01/2011, PTCA/DES to LM and LCx in 2015, and cutting balloon angioplasty to prox Cx and DES to SVG-RCA in 05/2017. Recently admitted for recurrent NSTEMI and underwent DES placement to the mid-RCA and DES placement to the LIMA-LAD. EF preserved at 60-65% by echo.  - she did have chest pain in the interim which led to a hospitalization and she ruled out for ACS. Denies any recurrent anginal symptoms since.  - continue on DAPT with ASA and Brilinta along with Atorvastatin, Amlodipine, Isordil, and Hydralazine. Not on BB therapy given recurrent episodes of bradycardia even with lose-dose Lopressor.   2. HTN - BP is elevated at 156/82 during today's visit. She has only been taking Hydralazine and Isosorbide twice daily due to her sleep schedule. Has been having some headaches with Isosorbide but says  this continues to improve. Will further titrate Hydralazine from 10mg  BID to 25mg  BID. Will likely require further titration pending BP response. Continue Amlodipine 10mg  daily and Isosorbide Dinitrate 5mg  BID.   3. HLD - no recent FLP on file by review of Epic. Followed by PCP. Goal LDL is < 70 in the setting of known CAD. Remains on Atorvastatin 80mg  daily.   4. Stage 3 CKD - creatinine peaked at 2.25 during admission, improving to 2.16 on 04/20/2018. Reports having repeat labs obtained by her PCP last week. Will request these records. Has been referred to Nephrology but needs to schedule an appointment.  - Lisinopril was discontinued during admission. Would not restart at this time given variable kidney function.    Medication Adjustments/Labs and Tests Ordered: Current medicines are reviewed at length with the patient today.  Concerns regarding medicines are outlined above.  Medication changes, Labs and Tests ordered today are listed in the Patient Instructions below. Patient Instructions  Medication Instructions:  Your physician recommends that you continue on your current medications as directed. Please refer to the Current Medication list given to you today. Hydralazine  25 mg Two Times Daily   Labwork: NONE   Testing/Procedures: NONE   Follow-Up: Your physician recommends that you schedule a follow-up appointment in: 2 Month   Any Other Special Instructions Will Be Listed Below (If Applicable).  If you need a refill on your cardiac medications before your next appointment, please call your pharmacy.  Thank you for choosing [facility]!    Signed, Erma Heritage, PA-C  05/04/2018 9:05 AM    [facility] [address] [place], [place] [address] Phone: [phone]

## 2018-05-03 NOTE — Patient Instructions (Signed)
Medication Instructions:  Your physician recommends that you continue on your current medications as directed. Please refer to the Current Medication list given to you today. Hydralazine 25 mg Two Times Daily   Labwork: NONE   Testing/Procedures: NONE   Follow-Up: Your physician recommends that you schedule a follow-up appointment in: 2 Month   Any Other Special Instructions Will Be Listed Below (If Applicable).     If you need a refill on your cardiac medications before your next appointment, please call your pharmacy.  Thank you for choosing [facility]!

## 2018-05-04 ENCOUNTER — Encounter: Payer: Self-pay | Admitting: Student

## 2018-05-08 DIAGNOSIS — E7800 Pure hypercholesterolemia, unspecified: Secondary | ICD-10-CM | POA: Diagnosis not present

## 2018-05-08 DIAGNOSIS — E039 Hypothyroidism, unspecified: Secondary | ICD-10-CM | POA: Diagnosis not present

## 2018-05-08 DIAGNOSIS — E1165 Type 2 diabetes mellitus with hyperglycemia: Secondary | ICD-10-CM | POA: Diagnosis not present

## 2018-05-08 DIAGNOSIS — I10 Essential (primary) hypertension: Secondary | ICD-10-CM | POA: Diagnosis not present

## 2018-05-08 DIAGNOSIS — I25721 Atherosclerosis of autologous artery coronary artery bypass graft(s) with angina pectoris with documented spasm: Secondary | ICD-10-CM | POA: Diagnosis not present

## 2018-05-08 DIAGNOSIS — R399 Unspecified symptoms and signs involving the genitourinary system: Secondary | ICD-10-CM | POA: Diagnosis not present

## 2018-05-10 DIAGNOSIS — I10 Essential (primary) hypertension: Secondary | ICD-10-CM | POA: Diagnosis not present

## 2018-05-10 DIAGNOSIS — Z6833 Body mass index (BMI) 33.0-33.9, adult: Secondary | ICD-10-CM | POA: Diagnosis not present

## 2018-05-10 DIAGNOSIS — I25721 Atherosclerosis of autologous artery coronary artery bypass graft(s) with angina pectoris with documented spasm: Secondary | ICD-10-CM | POA: Diagnosis not present

## 2018-05-10 DIAGNOSIS — R69 Illness, unspecified: Secondary | ICD-10-CM | POA: Diagnosis not present

## 2018-05-14 DIAGNOSIS — R69 Illness, unspecified: Secondary | ICD-10-CM | POA: Diagnosis not present

## 2018-05-14 DIAGNOSIS — I70201 Unspecified atherosclerosis of native arteries of extremities, right leg: Secondary | ICD-10-CM | POA: Diagnosis not present

## 2018-05-14 DIAGNOSIS — I10 Essential (primary) hypertension: Secondary | ICD-10-CM | POA: Diagnosis not present

## 2018-05-14 DIAGNOSIS — E039 Hypothyroidism, unspecified: Secondary | ICD-10-CM | POA: Diagnosis not present

## 2018-05-14 DIAGNOSIS — Z6832 Body mass index (BMI) 32.0-32.9, adult: Secondary | ICD-10-CM | POA: Diagnosis not present

## 2018-07-12 NOTE — Progress Notes (Deleted)
Cardiology Office Note  Date: 07/12/2018   ID: Donna Howe, DOB 1955-12-15, MRN [id number]  PCP: Rory Percy, MD  Primary Cardiologist: Rozann Lesches, MD   No chief complaint on file.   History of Present Illness: Donna Howe is a 62 y.o. female last seen in August by Ms. Strader PA-C.  She was seen at that time following hospitalization with NSTEMI and cardiac catheterization, ultimately with placement of DES to the mid RCA and to the LIMA to LAD.  At the last visit hydralazine dose was uptitrated.  She is no longer on ACE inhibitor due to renal insufficiency.  Past Medical History:  Diagnosis Date  . Anemia   . Asthma   . CAD (coronary artery disease)    Multivessel s/p CABG 2005, numerous PCIs since that time  . Carotid artery disease (HCC)    R CEA  . Chronic kidney disease (CKD), stage III (moderate) (HCC)   . Essential hypertension   . Gout   . Hyperlipidemia   . Hypothyroidism   . Myocardial infarction ([place])   . PAD (peripheral artery disease) ([place])    Dr. Kellie Simmering  . S/P angioplasty with stent- DES to [facility] and to LIMA to LAD with DES 04/09/18.   04/10/2018  . SBO (small bowel obstruction) ([place]) 2011   Status post lysis of adhesions & hernia repair  . Sinus bradycardia   . Thrombocytopenia, unspecified ([place])   . Type 2 diabetes mellitus ([place])   . Umbilical hernia     Past Surgical History:  Procedure Laterality Date  . CESAREAN SECTION  1984  . CHOLECYSTECTOMY  2010  . CORONARY ARTERY BYPASS GRAFT  2005  . CORONARY BALLOON ANGIOPLASTY N/A 05/31/2017   Procedure: CORONARY BALLOON ANGIOPLASTY;  Surgeon: Jettie Booze, MD;  Location: [place] [facility];  Service: Cardiovascular;  Laterality: N/A;  . CORONARY STENT INTERVENTION N/A 05/31/2017   Procedure: CORONARY STENT INTERVENTION;  Surgeon: Jettie Booze, MD;  Location: [place] [facility];  Service: Cardiovascular;  Laterality: N/A;  . CORONARY STENT INTERVENTION N/A 04/09/2018   Procedure: CORONARY STENT INTERVENTION;  Surgeon: Jettie Booze, MD;  Location: [place] [facility];  Service: Cardiovascular;  Laterality: N/A;  SVG RCA  . ENDARTERECTOMY Right 04/18/2013   Procedure: ENDARTERECTOMY CAROTID;  Surgeon: Mal Misty, MD;  Location: [place];  Service: Vascular;  Laterality: Right;  . HERNIA REPAIR  1989  . Incisional hernia repair x2  03/04/2010   Laparoscopic with 35cm mesh by Dr Ronnald Collum  . LEFT HEART CATH AND CORS/GRAFTS ANGIOGRAPHY N/A 05/31/2017   Procedure: LEFT HEART CATH AND CORS/GRAFTS ANGIOGRAPHY;  Surgeon: Jettie Booze, MD;  Location: [place] [facility];  Service: Cardiovascular;  Laterality: N/A;  . LEFT HEART CATH AND CORS/GRAFTS ANGIOGRAPHY N/A 04/08/2018   Procedure: LEFT HEART CATH AND CORS/GRAFTS ANGIOGRAPHY;  Surgeon: Jettie Booze, MD;  Location: [place] [facility];  Service: Cardiovascular;  Laterality: N/A;  . LEFT HEART CATHETERIZATION WITH CORONARY ANGIOGRAM N/A 12/19/2012   Procedure: LEFT HEART CATHETERIZATION WITH CORONARY ANGIOGRAM;  Surgeon: Josue Hector, MD;  Location: [facility] CATH LAB;  Service: Cardiovascular;  Laterality: N/A;  . LEFT HEART CATHETERIZATION WITH CORONARY/GRAFT ANGIOGRAM N/A 04/19/2013   Procedure: LEFT HEART CATHETERIZATION WITH Beatrix Fetters;  Surgeon: Lorretta Harp, MD;  Location: [facility] CATH LAB;  Service: Cardiovascular;  Laterality: N/A;  . PATCH ANGIOPLASTY Right 04/18/2013   Procedure: PATCH ANGIOPLASTY Right Internal Carotid Artery;  Surgeon: Nelda Severe  Kellie Simmering, MD;  Location: [place];  Service: Vascular;  Laterality: Right;  . PERCUTANEOUS CORONARY STENT INTERVENTION (PCI-S) Right 12/19/2012   Procedure: PERCUTANEOUS CORONARY STENT INTERVENTION (PCI-S);  Surgeon: Josue Hector, MD;  Location: [facility] CATH LAB;  Service: Cardiovascular;  Laterality: Right;  . SHOULDER SURGERY      Current Outpatient Medications  Medication Sig Dispense Refill  . ALPRAZolam (XANAX) 0.5 MG tablet Take 1/2 tab by mouth  every morning & evening and 1 tab at bedtime    . amLODipine (NORVASC) 10 MG tablet Take 1 tablet (10 mg total) by mouth daily. 30 tablet 6  . aspirin EC 81 MG tablet Take 81 mg by mouth daily.    Marland Kitchen atorvastatin (LIPITOR) 80 MG tablet Take 1 tablet (80 mg total) by mouth daily. 30 tablet 0  . colchicine 0.6 MG tablet Take 0.6 mg by mouth daily.     Marland Kitchen gabapentin (NEURONTIN) 400 MG capsule Take 400 mg by mouth at bedtime.     . hydrALAZINE (APRESOLINE) 25 MG tablet Take 1 tablet (25 mg total) by mouth 2 (two) times daily. 180 tablet 3  . isosorbide dinitrate (ISORDIL) 5 MG tablet Take 5 mg by mouth 2 (two) times daily.    Marland Kitchen levothyroxine (SYNTHROID, LEVOTHROID) 125 MCG tablet Take 125 mcg by mouth every morning.    . nitroGLYCERIN (NITROSTAT) 0.4 MG SL tablet Place 0.4 mg under the tongue every 5 (five) minutes as needed for chest pain. Not to exceed 3 in 15 minute time frame    . NOVOLIN 70/30 RELION (70-30) 100 UNIT/ML injection Inject 24 Units into the skin 2 (two) times daily with a meal.     . ondansetron (ZOFRAN ODT) 8 MG disintegrating tablet Take 1 tablet (8 mg total) by mouth every 8 (eight) hours as needed for nausea or vomiting. 20 tablet 0  . ticagrelor (BRILINTA) 90 MG TABS tablet Take 1 tablet (90 mg total) by mouth 2 (two) times daily. 60 tablet 10  . VENTOLIN HFA 108 (90 Base) MCG/ACT inhaler INHALE 2 PUFFS BY MOUTH 4 TIMES DAILY AS NEEDED FOR SHORTNESS OF BREATH  12   No current facility-administered medications for this visit.    Allergies:  Penicillins   Social History: The patient  reports that she quit smoking about 5 years ago. Her smoking use included cigarettes. She has a 20.00 pack-year smoking history. She has never used smokeless tobacco. She reports that she does not drink alcohol or use drugs.   Family History: The patient's family history includes AAA (abdominal aortic aneurysm) in her father; Diabetes in her mother; Heart disease in her brother and mother;  Hyperlipidemia in her mother and son; Hypertension in her brother, father, mother, and son; Thyroid disease in her father.   ROS:  Please see the history of present illness. Otherwise, complete review of systems is positive for {NONE DEFAULTED:18576::"none"}.  All other systems are reviewed and negative.   Physical Exam: VS:  There were no vitals taken for this visit., BMI There is no height or weight on file to calculate BMI.  Wt Readings from Last 3 Encounters:  05/03/18 200 lb (90.7 kg)  04/19/18 205 lb 11 oz (93.3 kg)  04/10/18 198 lb (89.8 kg)    General: Patient appears comfortable at rest. HEENT: Conjunctiva and lids normal, oropharynx clear with moist mucosa. Neck: Supple, no elevated JVP or carotid bruits, no thyromegaly. Lungs: Clear to auscultation, nonlabored breathing at rest. Cardiac: Regular rate and rhythm, no S3  or significant systolic murmur, no pericardial rub. Abdomen: Soft, nontender, no hepatomegaly, bowel sounds present, no guarding or rebound. Extremities: No pitting edema, distal pulses 2+. Skin: Warm and dry. Musculoskeletal: No kyphosis. Neuropsychiatric: Alert and oriented x3, affect grossly appropriate.  ECG: I personally reviewed the tracing from 04/18/2018 which showed sinus bradycardia with probable limb lead reversal.  Recent Labwork: 04/18/2018: B Natriuretic Peptide 295.0 04/19/2018: Hemoglobin 11.0; Platelets 150 04/20/2018: ALT 15; AST 12; BUN 58; Creatinine, Ser 2.16; Potassium 4.9; Sodium 138     Component Value Date/Time   CHOL 239 (H) 05/31/2017 0743   TRIG 193 (H) 05/31/2017 0743   HDL 35 (L) 05/31/2017 0743   CHOLHDL 6.8 05/31/2017 0743   VLDL 39 05/31/2017 0743   LDLCALC 165 (H) 05/31/2017 0743    Other Studies Reviewed Today:  PCI 04/09/2018:  Non-stenotic Ost LM to LM lesion previously treated.  Ost LAD to Prox LAD lesion is 80% stenosed.  Non-stenotic Ost Cx to Mid Cx lesion previously treated.  1st Mrg-1 lesion is 70%  stenosed.  Ost 1st Mrg lesion is 80% stenosed.  1st Mrg-2 lesion is 10% stenosed.  Prox Cx to Mid Cx lesion is 50% stenosed.  Patent stent in the ostium of the SVG to the RCA  Mid RCA lesion is 90% stenosed.  A drug-eluting stent was successfully placed using a STENT SIERRA 2.50 X 15 MM, through the SVG to RCA graft.  Post intervention, there is a 0% residual stenosis.  Origin lesion is 70% stenosed of the LIMA to LAD.  A drug-eluting stent was successfully placed using a STENT SYNERGY DES 3X12, postdilated to 3.25 mm.  Post intervention, there is a 0% residual stenosis.   Recommend uninterrupted dual antiplatelet therapy with Aspirin 81mg  daily and Ticagrelor 90mg  twice daily for a minimum of 12 months (ACS - Class I recommendation).   Catheterization 04/08/2018:  Non-stenotic Ost LM to LM lesion previously treated.  Ost LAD to Prox LAD lesion is 80% stenosed. LIMA to LAD is patent with a proximal 60% lesion.  Non-stenotic Ost Cx to Mid Cx lesion previously treated.  Prox Cx to Mid Cx lesion is 50% stenosed.  Balloon angioplasty was performed.  1st Mrg-1 lesion is 70% stenosed.  Ost 1st Mrg lesion is 80% stenosed.  1st Mrg-2 lesion is 10% stenosed.  Origin to Prox Graft lesion is 100% stenosed. SVG to OM is occluded.  Previously placed Origin drug eluting stent is widely patent.  Balloon angioplasty was performed.  Mid RCA lesion is 90% stenosed. THis is past the insertion of the graft.  LV end diastolic pressure is mildly elevated.  There is no aortic valve stenosis.  Origin lesion is 60% stenosed.  Echocardiogram 04/05/2018: Study Conclusions  - Left ventricle: The cavity size was normal. Wall thickness was   normal. Systolic function was normal. The estimated ejection   fraction was in the range of 60% to 65%. Wall motion was normal;   there were no regional wall motion abnormalities. Features are   consistent with a pseudonormal left ventricular  filling pattern,   with concomitant abnormal relaxation and increased filling   pressure (grade 2 diastolic dysfunction). Doppler parameters are   consistent with high ventricular filling pressure. - Aortic valve: Valve area (VTI): 1.56 cm^2. Valve area (Vmax):   1.63 cm^2. - Mitral valve: There was mild regurgitation. - Left atrium: The atrium was mildly dilated. - Technically adequate study.  Assessment and Plan:   Current medicines were reviewed with the  patient today.  No orders of the defined types were placed in this encounter.   Disposition:  Signed, Satira Sark, MD, [facility] 07/12/2018 9:15 AM    [facility] [address], [place], [place] [address] Phone: [phone]; Fax: [phone]

## 2018-07-15 ENCOUNTER — Ambulatory Visit: Payer: Medicare HMO | Admitting: Cardiology

## 2018-07-17 DIAGNOSIS — R69 Illness, unspecified: Secondary | ICD-10-CM | POA: Diagnosis not present

## 2018-07-19 DIAGNOSIS — R69 Illness, unspecified: Secondary | ICD-10-CM | POA: Diagnosis not present

## 2018-07-22 DIAGNOSIS — R69 Illness, unspecified: Secondary | ICD-10-CM | POA: Diagnosis not present

## 2018-07-30 DIAGNOSIS — E1140 Type 2 diabetes mellitus with diabetic neuropathy, unspecified: Secondary | ICD-10-CM | POA: Diagnosis not present

## 2018-07-30 DIAGNOSIS — Z6834 Body mass index (BMI) 34.0-34.9, adult: Secondary | ICD-10-CM | POA: Diagnosis not present

## 2018-08-17 DIAGNOSIS — E1140 Type 2 diabetes mellitus with diabetic neuropathy, unspecified: Secondary | ICD-10-CM | POA: Diagnosis not present

## 2018-08-26 NOTE — Progress Notes (Signed)
Cardiology Office Note  Date: 08/27/2018   ID: Donna Howe, DOB [birth year], MRN [id number]  PCP: Rory Percy, MD  Primary Cardiologist: Rozann Lesches, MD   Chief Complaint  Patient presents with  . Coronary Artery Disease    History of Present Illness: Donna Howe is a 62 y.o. female last seen by Ms. Strader PA-C in August.  She presents for a routine visit.  She states that she has been under a lot of emotional stress.  No active angina symptoms at this time however.  She has felt a vague sense of intermittent breathlessness, not exertional, questioning whether it could be related to Brilinta.  I reviewed her current medications which are listed below.  Antihypertensive regimen has been uptitrated by PCP office.  I have requested her most recent lipid panel, she reports compliance with Lipitor.  Past Medical History:  Diagnosis Date  . Anemia   . Asthma   . CAD (coronary artery disease)    Multivessel s/p CABG 2005, numerous PCIs since that time  . Carotid artery disease (HCC)    R CEA  . Chronic kidney disease (CKD), stage III (moderate) (HCC)   . Essential hypertension   . Gout   . Hyperlipidemia   . Hypothyroidism   . Myocardial infarction ([place])   . PAD (peripheral artery disease) ([place])    Dr. Kellie Simmering  . S/P angioplasty with stent- DES to [facility] and to LIMA to LAD with DES 04/09/18.   04/10/2018  . SBO (small bowel obstruction) ([place]) 2011   Status post lysis of adhesions & hernia repair  . Sinus bradycardia   . Thrombocytopenia, unspecified ([place])   . Type 2 diabetes mellitus ([place])   . Umbilical hernia     Past Surgical History:  Procedure Laterality Date  . CESAREAN SECTION  1984  . CHOLECYSTECTOMY  2010  . CORONARY ARTERY BYPASS GRAFT  2005  . CORONARY BALLOON ANGIOPLASTY N/A 05/31/2017   Procedure: CORONARY BALLOON ANGIOPLASTY;  Surgeon: Jettie Booze, MD;  Location: [place] [facility];  Service: Cardiovascular;  Laterality: N/A;  .  CORONARY STENT INTERVENTION N/A 05/31/2017   Procedure: CORONARY STENT INTERVENTION;  Surgeon: Jettie Booze, MD;  Location: [place] [facility];  Service: Cardiovascular;  Laterality: N/A;  . CORONARY STENT INTERVENTION N/A 04/09/2018   Procedure: CORONARY STENT INTERVENTION;  Surgeon: Jettie Booze, MD;  Location: [place] [facility];  Service: Cardiovascular;  Laterality: N/A;  SVG RCA  . ENDARTERECTOMY Right 04/18/2013   Procedure: ENDARTERECTOMY CAROTID;  Surgeon: Mal Misty, MD;  Location: [place];  Service: Vascular;  Laterality: Right;  . HERNIA REPAIR  1989  . Incisional hernia repair x2  03/04/2010   Laparoscopic with 35cm mesh by Dr Ronnald Collum  . LEFT HEART CATH AND CORS/GRAFTS ANGIOGRAPHY N/A 05/31/2017   Procedure: LEFT HEART CATH AND CORS/GRAFTS ANGIOGRAPHY;  Surgeon: Jettie Booze, MD;  Location: [place] [facility];  Service: Cardiovascular;  Laterality: N/A;  . LEFT HEART CATH AND CORS/GRAFTS ANGIOGRAPHY N/A 04/08/2018   Procedure: LEFT HEART CATH AND CORS/GRAFTS ANGIOGRAPHY;  Surgeon: Jettie Booze, MD;  Location: [place] [facility];  Service: Cardiovascular;  Laterality: N/A;  . LEFT HEART CATHETERIZATION WITH CORONARY ANGIOGRAM N/A 12/19/2012   Procedure: LEFT HEART CATHETERIZATION WITH CORONARY ANGIOGRAM;  Surgeon: Josue Hector, MD;  Location: [facility] CATH LAB;  Service: Cardiovascular;  Laterality: N/A;  . LEFT HEART CATHETERIZATION WITH CORONARY/GRAFT ANGIOGRAM N/A 04/19/2013   Procedure: LEFT HEART  CATHETERIZATION WITH Beatrix Fetters;  Surgeon: Lorretta Harp, MD;  Location: [facility] CATH LAB;  Service: Cardiovascular;  Laterality: N/A;  . PATCH ANGIOPLASTY Right 04/18/2013   Procedure: PATCH ANGIOPLASTY Right Internal Carotid Artery;  Surgeon: Mal Misty, MD;  Location: [place];  Service: Vascular;  Laterality: Right;  . PERCUTANEOUS CORONARY STENT INTERVENTION (PCI-S) Right 12/19/2012   Procedure: PERCUTANEOUS CORONARY STENT INTERVENTION (PCI-S);   Surgeon: Josue Hector, MD;  Location: [facility] CATH LAB;  Service: Cardiovascular;  Laterality: Right;  . SHOULDER SURGERY      Current Outpatient Medications  Medication Sig Dispense Refill  . ALPRAZolam (XANAX) 0.5 MG tablet Take 1/2 tab by mouth every morning & evening and 1 tab at bedtime    . amLODipine (NORVASC) 10 MG tablet Take 1 tablet (10 mg total) by mouth daily. 30 tablet 6  . aspirin EC 81 MG tablet Take 81 mg by mouth daily.    Marland Kitchen atorvastatin (LIPITOR) 80 MG tablet Take 1 tablet (80 mg total) by mouth daily. 30 tablet 0  . colchicine 0.6 MG tablet Take 0.6 mg by mouth daily.     Marland Kitchen gabapentin (NEURONTIN) 400 MG capsule Take 400 mg by mouth at bedtime.     . hydrALAZINE (APRESOLINE) 50 MG tablet Take 50 mg by mouth 2 (two) times daily.  3  . isosorbide dinitrate (ISORDIL) 5 MG tablet Take 5 mg by mouth 2 (two) times daily.    Marland Kitchen levothyroxine (SYNTHROID, LEVOTHROID) 125 MCG tablet Take 125 mcg by mouth every morning.    . nitroGLYCERIN (NITROSTAT) 0.4 MG SL tablet Place 0.4 mg under the tongue every 5 (five) minutes as needed for chest pain. Not to exceed 3 in 15 minute time frame    . NOVOLIN 70/30 RELION (70-30) 100 UNIT/ML injection Inject 24 Units into the skin 2 (two) times daily with a meal.     . ondansetron (ZOFRAN ODT) 8 MG disintegrating tablet Take 1 tablet (8 mg total) by mouth every 8 (eight) hours as needed for nausea or vomiting. 20 tablet 0  . VENTOLIN HFA 108 (90 Base) MCG/ACT inhaler INHALE 2 PUFFS BY MOUTH 4 TIMES DAILY AS NEEDED FOR SHORTNESS OF BREATH  12  . clopidogrel (PLAVIX) 75 MG tablet Take 1 tablet (75 mg total) by mouth daily. 90 tablet 3   No current facility-administered medications for this visit.    Allergies:  Penicillins   Social History: The patient  reports that she quit smoking about 5 years ago. Her smoking use included cigarettes. She has a 20.00 pack-year smoking history. She has never used smokeless tobacco. She reports that she does not drink  alcohol or use drugs.   ROS:  Please see the history of present illness. Otherwise, complete review of systems is positive for fatigue, stress.  All other systems are reviewed and negative.   Physical Exam: VS:  BP (!) 168/88   Pulse 66   Ht 5\' 6"  (1.676 m)   Wt 200 lb (90.7 kg)   SpO2 98%   BMI 32.28 kg/m , BMI Body mass index is 32.28 kg/m.  Wt Readings from Last 3 Encounters:  08/27/18 200 lb (90.7 kg)  05/03/18 200 lb (90.7 kg)  04/19/18 205 lb 11 oz (93.3 kg)    General: Patient appears comfortable at rest. HEENT: Conjunctiva and lids normal, oropharynx clear. Neck: Supple, no elevated JVP or carotid bruits, no thyromegaly. Lungs: Clear to auscultation, nonlabored breathing at rest. Cardiac: Regular rate and rhythm, no  S3, 2/6 systolic murmur. Abdomen: Soft, nontender, bowel sounds present. Extremities: No pitting edema, distal pulses 2+. Skin: Warm and dry. Musculoskeletal: No kyphosis. Neuropsychiatric: Alert and oriented x3, affect grossly appropriate.  ECG: I personally reviewed the tracing from 04/18/2018 which showed sinus bradycardia with probable arm lead reversal.  Recent Labwork: 04/18/2018: B Natriuretic Peptide 295.0 04/19/2018: Hemoglobin 11.0; Platelets 150 04/20/2018: ALT 15; AST 12; BUN 58; Creatinine, Ser 2.16; Potassium 4.9; Sodium 138     Component Value Date/Time   CHOL 239 (H) 05/31/2017 0743   TRIG 193 (H) 05/31/2017 0743   HDL 35 (L) 05/31/2017 0743   CHOLHDL 6.8 05/31/2017 0743   VLDL 39 05/31/2017 0743   LDLCALC 165 (H) 05/31/2017 0743    Other Studies Reviewed Today:  Coronary PCI 04/09/2018:  Non-stenotic Ost LM to LM lesion previously treated.  Ost LAD to Prox LAD lesion is 80% stenosed.  Non-stenotic Ost Cx to Mid Cx lesion previously treated.  1st Mrg-1 lesion is 70% stenosed.  Ost 1st Mrg lesion is 80% stenosed.  1st Mrg-2 lesion is 10% stenosed.  Prox Cx to Mid Cx lesion is 50% stenosed.  Patent stent in the ostium of the SVG  to the RCA  Mid RCA lesion is 90% stenosed.  A drug-eluting stent was successfully placed using a STENT SIERRA 2.50 X 15 MM, through the SVG to RCA graft.  Post intervention, there is a 0% residual stenosis.  Origin lesion is 70% stenosed of the LIMA to LAD.  A drug-eluting stent was successfully placed using a STENT SYNERGY DES 3X12, postdilated to 3.25 mm.  Post intervention, there is a 0% residual stenosis.   Recommend uninterrupted dual antiplatelet therapy with Aspirin 81mg  daily and Ticagrelor 90mg  twice daily for a minimum of 12 months (ACS - Class I recommendation).   Continue aggressive secondary prevention.    Cardiac catheterization 04/08/2018:  Non-stenotic Ost LM to LM lesion previously treated.  Ost LAD to Prox LAD lesion is 80% stenosed. LIMA to LAD is patent with a proximal 60% lesion.  Non-stenotic Ost Cx to Mid Cx lesion previously treated.  Prox Cx to Mid Cx lesion is 50% stenosed.  Balloon angioplasty was performed.  1st Mrg-1 lesion is 70% stenosed.  Ost 1st Mrg lesion is 80% stenosed.  1st Mrg-2 lesion is 10% stenosed.  Origin to Prox Graft lesion is 100% stenosed. SVG to OM is occluded.  Previously placed Origin drug eluting stent is widely patent.  Balloon angioplasty was performed.  Mid RCA lesion is 90% stenosed. THis is past the insertion of the graft.  LV end diastolic pressure is mildly elevated.  There is no aortic valve stenosis.  Origin lesion is 60% stenosed.  Recommend dual antiplatelet therapy with Aspirin 81mg  daily and Clopidogrel 75mg  daily long-term (beyond 12 months) because of extensive CAD, and multiple stents..   Will look into repeat PCI attempt tomorrow if Cr is ok.  Could consider redo CABG eval as well given extensive circ disease and now two lesions in the SVG to RCA in the last 12 months, proximal LIMA lesion.  Will discuss with interventional colleagues.  Echocardiogram 04/05/2018: Study Conclusions  - Left  ventricle: The cavity size was normal. Wall thickness was   normal. Systolic function was normal. The estimated ejection   fraction was in the range of 60% to 65%. Wall motion was normal;   there were no regional wall motion abnormalities. Features are   consistent with a pseudonormal left ventricular filling pattern,  with concomitant abnormal relaxation and increased filling   pressure (grade 2 diastolic dysfunction). Doppler parameters are   consistent with high ventricular filling pressure. - Aortic valve: Valve area (VTI): 1.56 cm^2. Valve area (Vmax):   1.63 cm^2. - Mitral valve: There was mild regurgitation. - Left atrium: The atrium was mildly dilated. - Technically adequate study.  Assessment and Plan:  1.  Multivessel CAD status post CABG with documented graft disease.  Most recent cardiac catheterization in July is outlined above at which point she underwent placement of DES to the mid RCA and DES to the LIMA to LAD.  No active angina at this time.  We will switch from Brilinta to Plavix as discussed above, otherwise continue baseline treatment.  2.  Essential hypertension, blood pressure elevated today.  She is undergoing adjustments in antihypertensives per PCP.  3.  CKD stage III, creatinine 2.16.  Not on ARB or ACE inhibitor.  4.  Mixed hyperlipidemia, reports compliance with high-dose Lipitor.  Requesting follow-up lipid panel from PCP.  Current medicines were reviewed with the patient today.  Disposition: Follow-up in 6 months.  Signed, Satira Sark, MD, [facility] 08/27/2018 12:14 PM    [facility] at [facility] [address] [place], [place] [address] Phone: [phone]; Fax: [phone]

## 2018-08-27 ENCOUNTER — Ambulatory Visit (INDEPENDENT_AMBULATORY_CARE_PROVIDER_SITE_OTHER): Payer: Medicare HMO | Admitting: Cardiology

## 2018-08-27 ENCOUNTER — Encounter: Payer: Self-pay | Admitting: Cardiology

## 2018-08-27 VITALS — BP 168/88 | HR 66 | Ht 66.0 in | Wt 200.0 lb

## 2018-08-27 DIAGNOSIS — I10 Essential (primary) hypertension: Secondary | ICD-10-CM | POA: Diagnosis not present

## 2018-08-27 DIAGNOSIS — N183 Chronic kidney disease, stage 3 unspecified: Secondary | ICD-10-CM

## 2018-08-27 DIAGNOSIS — I25119 Atherosclerotic heart disease of native coronary artery with unspecified angina pectoris: Secondary | ICD-10-CM | POA: Diagnosis not present

## 2018-08-27 DIAGNOSIS — E782 Mixed hyperlipidemia: Secondary | ICD-10-CM

## 2018-08-27 MED ORDER — CLOPIDOGREL BISULFATE 75 MG PO TABS: 75.0000 mg | ORAL_TABLET | Freq: Every day | ORAL | 3 refills | Status: DC

## 2018-08-27 NOTE — Patient Instructions (Signed)
Medication Instructions:  Stop BRILINTA  Start Plavix 75 gm once daily   Labwork: I will request labs from pcp  Testing/Procedures: none  Follow-Up: Your physician wants you to follow-up in: 6 months .  You will receive a reminder letter in the mail two months in advance. If you don't receive a letter, please call our office to schedule the follow-up appointment.   Any Other Special Instructions Will Be Listed Below (If Applicable).     If you need a refill on your cardiac medications before your next appointment, please call your pharmacy.

## 2018-09-16 DIAGNOSIS — I10 Essential (primary) hypertension: Secondary | ICD-10-CM | POA: Diagnosis not present

## 2018-09-16 DIAGNOSIS — E1140 Type 2 diabetes mellitus with diabetic neuropathy, unspecified: Secondary | ICD-10-CM | POA: Diagnosis not present

## 2018-10-01 ENCOUNTER — Emergency Department (HOSPITAL_COMMUNITY): Payer: Medicare HMO

## 2018-10-01 ENCOUNTER — Emergency Department (HOSPITAL_COMMUNITY)
Admission: EM | Admit: 2018-10-01 | Discharge: 2018-10-01 | Disposition: A | Discharge status: Home / Self Care | Payer: Medicare HMO | Attending: Emergency Medicine | Admitting: Emergency Medicine

## 2018-10-01 ENCOUNTER — Other Ambulatory Visit: Payer: Self-pay

## 2018-10-01 ENCOUNTER — Encounter (HOSPITAL_COMMUNITY): Payer: Self-pay | Admitting: *Deleted

## 2018-10-01 DIAGNOSIS — W0110XA Fall on same level from slipping, tripping and stumbling with subsequent striking against unspecified object, initial encounter: Secondary | ICD-10-CM | POA: Insufficient documentation

## 2018-10-01 DIAGNOSIS — Y939 Activity, unspecified: Secondary | ICD-10-CM | POA: Insufficient documentation

## 2018-10-01 DIAGNOSIS — S8391XA Sprain of unspecified site of right knee, initial encounter: Secondary | ICD-10-CM | POA: Insufficient documentation

## 2018-10-01 DIAGNOSIS — J45909 Unspecified asthma, uncomplicated: Secondary | ICD-10-CM | POA: Insufficient documentation

## 2018-10-01 DIAGNOSIS — E039 Hypothyroidism, unspecified: Secondary | ICD-10-CM | POA: Diagnosis not present

## 2018-10-01 DIAGNOSIS — W19XXXA Unspecified fall, initial encounter: Secondary | ICD-10-CM

## 2018-10-01 DIAGNOSIS — I129 Hypertensive chronic kidney disease with stage 1 through stage 4 chronic kidney disease, or unspecified chronic kidney disease: Secondary | ICD-10-CM | POA: Diagnosis not present

## 2018-10-01 DIAGNOSIS — E1122 Type 2 diabetes mellitus with diabetic chronic kidney disease: Secondary | ICD-10-CM | POA: Diagnosis not present

## 2018-10-01 DIAGNOSIS — Y999 Unspecified external cause status: Secondary | ICD-10-CM | POA: Insufficient documentation

## 2018-10-01 DIAGNOSIS — Z7982 Long term (current) use of aspirin: Secondary | ICD-10-CM | POA: Diagnosis not present

## 2018-10-01 DIAGNOSIS — S8991XA Unspecified injury of right lower leg, initial encounter: Secondary | ICD-10-CM | POA: Diagnosis not present

## 2018-10-01 DIAGNOSIS — Z7901 Long term (current) use of anticoagulants: Secondary | ICD-10-CM | POA: Insufficient documentation

## 2018-10-01 DIAGNOSIS — M542 Cervicalgia: Secondary | ICD-10-CM | POA: Diagnosis not present

## 2018-10-01 DIAGNOSIS — Y9259 Other trade areas as the place of occurrence of the external cause: Secondary | ICD-10-CM | POA: Diagnosis not present

## 2018-10-01 DIAGNOSIS — I2510 Atherosclerotic heart disease of native coronary artery without angina pectoris: Secondary | ICD-10-CM | POA: Insufficient documentation

## 2018-10-01 DIAGNOSIS — M25561 Pain in right knee: Secondary | ICD-10-CM

## 2018-10-01 DIAGNOSIS — N183 Chronic kidney disease, stage 3 (moderate): Secondary | ICD-10-CM | POA: Diagnosis not present

## 2018-10-01 DIAGNOSIS — Z79899 Other long term (current) drug therapy: Secondary | ICD-10-CM | POA: Diagnosis not present

## 2018-10-01 DIAGNOSIS — Z951 Presence of aortocoronary bypass graft: Secondary | ICD-10-CM | POA: Diagnosis not present

## 2018-10-01 DIAGNOSIS — Z87891 Personal history of nicotine dependence: Secondary | ICD-10-CM | POA: Insufficient documentation

## 2018-10-01 DIAGNOSIS — S80911A Unspecified superficial injury of right knee, initial encounter: Secondary | ICD-10-CM | POA: Diagnosis present

## 2018-10-01 DIAGNOSIS — R51 Headache: Secondary | ICD-10-CM | POA: Insufficient documentation

## 2018-10-01 DIAGNOSIS — S161XXA Strain of muscle, fascia and tendon at neck level, initial encounter: Secondary | ICD-10-CM | POA: Insufficient documentation

## 2018-10-01 DIAGNOSIS — S0990XA Unspecified injury of head, initial encounter: Secondary | ICD-10-CM | POA: Diagnosis not present

## 2018-10-01 DIAGNOSIS — S199XXA Unspecified injury of neck, initial encounter: Secondary | ICD-10-CM | POA: Diagnosis not present

## 2018-10-01 MED ORDER — CYCLOBENZAPRINE HCL 5 MG PO TABS: 5.0000 mg | ORAL_TABLET | Freq: Three times a day (TID) | ORAL | 0 refills | Status: DC | PRN

## 2018-10-01 MED ORDER — OXYCODONE-ACETAMINOPHEN 5-325 MG PO TABS: 1.0000 | ORAL_TABLET | ORAL | 0 refills | Status: DC | PRN

## 2018-10-01 MED ORDER — OXYCODONE-ACETAMINOPHEN 5-325 MG PO TABS
1.0000 | ORAL_TABLET | Freq: Once | ORAL | Status: AC
  Administered 2018-10-01: 1 via ORAL
  Filled 2018-10-01: qty 1

## 2018-10-01 NOTE — ED Triage Notes (Signed)
Pt tripped over a piece of wood on a pallet while shopping today, c/o neck, back, right knee and upper leg and headache. Denies any LOC. Pt states that she fell about 16:30

## 2018-10-01 NOTE — Discharge Instructions (Addendum)
Your x-rays are reassuring this evening, no fractures or dislocations.  The CT scan of your cervical spine suggest that you do have some muscle spasm in your neck which may be causing pain and may also be the source of your headache.  I suggest using the medication prescribed for pain relief, use caution as this medication will make you drowsy.  I also advise ice packs for the knee and across the neck and shoulders for the next 2 days, after which she can also add a heating pad for 20 minutes several times daily.  Plan to see your doctor for recheck if your symptoms are not improving over the next week.

## 2018-10-03 NOTE — ED Provider Notes (Signed)
[facility] EMERGENCY DEPARTMENT Provider Note   CSN: [id number] Arrival date & time: 10/01/18  1744     History   Chief Complaint Chief Complaint  Patient presents with  . Fall    HPI Donna Howe is a 63 y.o. female with a past medical history including asthma, CAD with history of MI, chronic kidney disease, hypertension and diabetes, not on any blood thinning medications except for aspirin 81 mg presenting with injury sustained in a fall approximately 5 hours before arrival.  She was at a local store when she tripped over the end of a pallet falling directly on her right knee.  She endorses right knee pain although has been ambulatory since the event.  She also describes worsened pain mid C-spine (he has chronic neck pain).  Additionally, she endorses a mild generalized headache, and states "I feel like I jarred my brain".  She denies actually hitting her head however.  She has had no focal weakness, no nausea, vomiting or dizziness since the event.  There is no radiation of her right knee pain which is along her anterior knee and worsened with movement and weightbearing.  She has employed rest and ice prior to arrival with no significant improvement in her symptoms.  The history is provided by the patient and a friend.    Past Medical History:  Diagnosis Date  . Anemia   . Asthma   . CAD (coronary artery disease)    Multivessel s/p CABG 2005, numerous PCIs since that time  . Carotid artery disease (HCC)    R CEA  . Chronic kidney disease (CKD), stage III (moderate) (HCC)   . Essential hypertension   . Gout   . Hyperlipidemia   . Hypothyroidism   . Myocardial infarction ([place])   . PAD (peripheral artery disease) ([place])    Dr. Kellie Simmering  . S/P angioplasty with stent- DES to [facility] and to [facility] to LAD with DES 04/09/18.   04/10/2018  . SBO (small bowel obstruction) ([place]) 2011   Status post lysis of adhesions & hernia repair  . Sinus bradycardia   . Thrombocytopenia, unspecified  ([place])   . Type 2 diabetes mellitus ([place])   . Umbilical hernia     Patient Active Problem List   Diagnosis Date Noted  . Unstable angina ([place]) 04/18/2018  . Hypothyroidism 04/18/2018  . S/P angioplasty with stent- DES to [facility] and to [facility] to LAD with DES 04/09/18.   04/10/2018  . Angina pectoris ([place]) 04/05/2018  . Status post coronary artery stent placement   . ACS (acute coronary syndrome) ([place]) 05/31/2017  . Acute chest pain   . Non-ST elevation (NSTEMI) myocardial infarction ([place])   . History of coronary artery disease   . Diabetes ([place]) 10/28/2013  . Hypoglycemia associated with diabetes ([place]) 10/28/2013  . Thrombocytopenia, unspecified ([place]) 10/28/2013  . Hypokalemia 10/27/2013  . Syncope 10/26/2013  . Anemia 10/26/2013  . Renal failure, acute on chronic (HCC) 10/26/2013  . Fracture of toe of right foot 10/26/2013  . Umbilical hernia   . Carotid artery disease ([place])   . Occlusion and stenosis of carotid artery without mention of cerebral infarction 04/15/2013  . Hx of CABG   . Ejection fraction   . Atherosclerosis of native arteries of the extremities with intermittent claudication 02/11/2013  . Diabetes mellitus with renal manifestation ([place]) 01/21/2013  . Bradycardia 01/03/2013  . Chronic kidney disease (CKD), stage III (moderate) (HCC)   . Gout   . PROTEINURIA, MILD  01/18/2010  . TOBACCO ABUSE 08/27/2009  . Obesity (BMI 30-39.9) 08/26/2009  . Asthma 08/26/2009  . Hyperlipidemia 03/31/2007  . Essential hypertension 03/31/2007  . CAD (coronary artery disease) 03/31/2007    Past Surgical History:  Procedure Laterality Date  . CESAREAN SECTION  1984  . CHOLECYSTECTOMY  2010  . CORONARY ARTERY BYPASS GRAFT  2005  . CORONARY BALLOON ANGIOPLASTY N/A 05/31/2017   Procedure: CORONARY BALLOON ANGIOPLASTY;  Surgeon: Jettie Booze, MD;  Location: [place] CV LAB;  Service: Cardiovascular;  Laterality: N/A;  . CORONARY STENT INTERVENTION N/A 05/31/2017   Procedure:  CORONARY STENT INTERVENTION;  Surgeon: Jettie Booze, MD;  Location: [place] CV LAB;  Service: Cardiovascular;  Laterality: N/A;  . CORONARY STENT INTERVENTION N/A 04/09/2018   Procedure: CORONARY STENT INTERVENTION;  Surgeon: Jettie Booze, MD;  Location: [place] CV LAB;  Service: Cardiovascular;  Laterality: N/A;  SVG RCA  . ENDARTERECTOMY Right 04/18/2013   Procedure: ENDARTERECTOMY CAROTID;  Surgeon: Mal Misty, MD;  Location: [place];  Service: Vascular;  Laterality: Right;  . HERNIA REPAIR  1989  . Incisional hernia repair x2  03/04/2010   Laparoscopic with 35cm mesh by Dr Ronnald Collum  . LEFT HEART CATH AND CORS/GRAFTS ANGIOGRAPHY N/A 05/31/2017   Procedure: LEFT HEART CATH AND CORS/GRAFTS ANGIOGRAPHY;  Surgeon: Jettie Booze, MD;  Location: [place] CV LAB;  Service: Cardiovascular;  Laterality: N/A;  . LEFT HEART CATH AND CORS/GRAFTS ANGIOGRAPHY N/A 04/08/2018   Procedure: LEFT HEART CATH AND CORS/GRAFTS ANGIOGRAPHY;  Surgeon: Jettie Booze, MD;  Location: [place] CV LAB;  Service: Cardiovascular;  Laterality: N/A;  . LEFT HEART CATHETERIZATION WITH CORONARY ANGIOGRAM N/A 12/19/2012   Procedure: LEFT HEART CATHETERIZATION WITH CORONARY ANGIOGRAM;  Surgeon: Josue Hector, MD;  Location: [facility] CATH LAB;  Service: Cardiovascular;  Laterality: N/A;  . LEFT HEART CATHETERIZATION WITH CORONARY/GRAFT ANGIOGRAM N/A 04/19/2013   Procedure: LEFT HEART CATHETERIZATION WITH Beatrix Fetters;  Surgeon: Lorretta Harp, MD;  Location: [facility] CATH LAB;  Service: Cardiovascular;  Laterality: N/A;  . PATCH ANGIOPLASTY Right 04/18/2013   Procedure: PATCH ANGIOPLASTY Right Internal Carotid Artery;  Surgeon: Mal Misty, MD;  Location: [place];  Service: Vascular;  Laterality: Right;  . PERCUTANEOUS CORONARY STENT INTERVENTION (PCI-S) Right 12/19/2012   Procedure: PERCUTANEOUS CORONARY STENT INTERVENTION (PCI-S);  Surgeon: Josue Hector, MD;  Location: [facility] C[facility] LAB;   Service: Cardiovascular;  Laterality: Right;  . SHOULDER SURGERY       OB History   No obstetric history on file.      Home Medications    Prior to Admission medications   Medication Sig Start Date End Date Taking? Authorizing Provider  ALPRAZolam Duanne Moron) 0.5 MG tablet Take 1/2 tab by mouth every morning & evening and 1 tab at bedtime    [provider]  amLODipine (NORVASC) 10 MG tablet Take 1 tablet (10 mg total) by mouth daily. 04/10/18   Isaiah Serge, NP  aspirin EC 81 MG tablet Take 81 mg by mouth daily.    [provider]  atorvastatin (LIPITOR) 80 MG tablet Take 1 tablet (80 mg total) by mouth daily. 06/01/17   Johnson, Clanford L, MD  clopidogrel (PLAVIX) 75 MG tablet Take 1 tablet (75 mg total) by mouth daily. 08/27/18   Satira Sark, MD  colchicine 0.6 MG tablet Take 0.6 mg by mouth daily.     [provider]  cyclobenzaprine (FLEXERIL) 5 MG tablet Take 1  tablet (5 mg total) by mouth 3 (three) times daily as needed for muscle spasms. 10/01/18   Evalee Jefferson, PA-C  gabapentin (NEURONTIN) 400 MG capsule Take 400 mg by mouth at bedtime.     Rory Percy, MD  hydrALAZINE (APRESOLINE) 50 MG tablet Take 50 mg by mouth 2 (two) times daily. 08/22/18   [provider]  isosorbide dinitrate (ISORDIL) 5 MG tablet Take 5 mg by mouth 2 (two) times daily.    [provider]  levothyroxine (SYNTHROID, LEVOTHROID) 125 MCG tablet Take 125 mcg by mouth every morning.    [provider]  nitroGLYCERIN (NITROSTAT) 0.4 MG SL tablet Place 0.4 mg under the tongue every 5 (five) minutes as needed for chest pain. Not to exceed 3 in 15 minute time frame    [provider]  NOVOLIN 70/30 RELION (70-30) 100 UNIT/ML injection Inject 24 Units into the skin 2 (two) times daily with a meal.  08/28/15   [provider]  ondansetron (ZOFRAN ODT) 8 MG disintegrating tablet Take 1 tablet (8 mg total) by mouth every 8 (eight) hours as  needed for nausea or vomiting. 08/14/15   Evalee Jefferson, PA-C  oxyCODONE-acetaminophen (PERCOCET/ROXICET) 5-325 MG tablet Take 1 tablet by mouth every 4 (four) hours as needed. 10/01/18   Dim Meisinger, Almyra Free, PA-C  VENTOLIN HFA 108 (90 Base) MCG/ACT inhaler INHALE 2 PUFFS BY MOUTH 4 TIMES DAILY AS NEEDED FOR SHORTNESS OF BREATH 04/13/18   [provider]    Family History Family History  Problem Relation Age of Onset  . Diabetes Mother   . Heart disease Mother        before age 66  . Hyperlipidemia Mother   . Hypertension Mother   . Thyroid disease Father   . Hypertension Father   . AAA (abdominal aortic aneurysm) Father   . Heart disease Brother        before age 67  . Hypertension Brother   . Hyperlipidemia Son   . Hypertension Son     Social History Social History   Tobacco Use  . Smoking status: Former Smoker    Packs/day: 1.00    Years: 20.00    Pack years: 20.00    Types: Cigarettes    Last attempt to quit: 12/10/2012    Years since quitting: 5.8  . Smokeless tobacco: Never Used  Substance Use Topics  . Alcohol use: No    Alcohol/week: 0.0 standard drinks  . Drug use: No     Allergies   Penicillins   Review of Systems Review of Systems  Constitutional: Negative for fever.  Musculoskeletal: Positive for arthralgias, joint swelling and neck pain. Negative for myalgias.  Neurological: Positive for headaches. Negative for dizziness, weakness and numbness.     Physical Exam Updated Vital Signs BP (!) 184/69 (BP Location: Right Arm)   Pulse 68   Temp 98.4 F (36.9 C) (Oral)   Resp 16   Ht 5\' 6"  (1.676 m)   Wt 93 kg   SpO2 99%   BMI 33.09 kg/m   Physical Exam Vitals signs and nursing note reviewed.  Constitutional:      Appearance: She is well-developed.  HENT:     Head: Normocephalic and atraumatic.  Eyes:     Extraocular Movements: Extraocular movements intact.     Pupils: Pupils are equal, round, and reactive to light.  Neck:      Musculoskeletal: Normal range of motion and neck supple. Pain with movement, spinous process tenderness and  muscular tenderness present.  Cardiovascular:     Rate and Rhythm: Normal rate.     Pulses:          Dorsalis pedis pulses are 2+ on the right side and 2+ on the left side.     Heart sounds: Normal heart sounds.  Pulmonary:     Effort: Pulmonary effort is normal.  Abdominal:     Palpations: Abdomen is soft.     Tenderness: There is no abdominal tenderness.  Musculoskeletal: Normal range of motion.     Right knee: She exhibits swelling, ecchymosis, bony tenderness and abnormal meniscus. She exhibits normal range of motion, no effusion, no deformity, no erythema, normal alignment, no LCL laxity and no MCL laxity.     Comments: Bony tenderness at the inferior patella with a mild bruising noted.  There is no disruption of the patellar tendon.  She can straight leg raise without knee joint weakness.  There is tenderness palpation across her medial anterior meniscus without palpable deformity.  There is no crepitus with limited range of motion.  No obvious effusion appreciated.  Lymphadenopathy:     Cervical: No cervical adenopathy.  Skin:    General: Skin is warm and dry.     Findings: No rash.  Neurological:     Mental Status: She is alert and oriented to person, place, and time.     GCS: GCS eye subscore is 4. GCS verbal subscore is 5. GCS motor subscore is 6.     Sensory: No sensory deficit.     Gait: Gait normal.     Comments: Normal heel-shin, normal rapid alternating movements. Cranial nerves III-XII intact.  No pronator drift.  Psychiatric:        Speech: Speech normal.        Behavior: Behavior normal.        Thought Content: Thought content normal.      ED Treatments / Results  Labs (all labs ordered are listed, but only abnormal results are displayed) Labs Reviewed - No data to display  EKG None  Radiology Ct Head Wo Contrast  Result Date: 10/01/2018 CLINICAL  DATA:  Trip and fall injury today. Neck pain and headache. No loss of consciousness. EXAM: CT HEAD WITHOUT CONTRAST CT CERVICAL SPINE WITHOUT CONTRAST TECHNIQUE: Multidetector CT imaging of the head and cervical spine was performed following the standard protocol without intravenous contrast. Multiplanar CT image reconstructions of the cervical spine were also generated. COMPARISON:  None. FINDINGS: CT HEAD FINDINGS Brain: No evidence of acute infarction, hemorrhage, hydrocephalus, extra-axial collection or mass lesion/mass effect. Vascular: Mild intracranial arterial vascular calcifications are present. Skull: Calvarium appears intact. No acute depressed skull fractures. Sinuses/Orbits: Paranasal sinuses and mastoid air cells are clear. Other: None. CT CERVICAL SPINE FINDINGS Alignment: Straightening of usual cervical lordosis without anterior subluxation. This may be due to patient positioning but ligamentous injury or muscle spasm could also have this appearance. Normal alignment of the facet joints. C1-2 articulation appears intact. Skull base and vertebrae: Skull base appears intact. No vertebral compression deformities. No focal bone lesion or bone destruction. Bone cortex and trabecular architecture appear intact. Soft tissues and spinal canal: No prevertebral soft tissue swelling. No paraspinal soft tissue mass or infiltration. Disc levels: Degenerative disc space narrowing and prominent hypertrophic changes posteriorly at T1-2. Degenerative changes also at C1-2. Upper chest: Lung apices are clear. Other: None. IMPRESSION: 1. No acute intracranial abnormalities. 2. Nonspecific straightening of usual cervical lordosis. No acute displaced fractures identified in the  cervical spine. Electronically Signed   By: Lucienne Capers M.D.   On: 10/01/2018 21:47   Ct Cervical Spine Wo Contrast  Result Date: 10/01/2018 CLINICAL DATA:  Trip and fall injury today. Neck pain and headache. No loss of consciousness.  EXAM: CT HEAD WITHOUT CONTRAST CT CERVICAL SPINE WITHOUT CONTRAST TECHNIQUE: Multidetector CT imaging of the head and cervical spine was performed following the standard protocol without intravenous contrast. Multiplanar CT image reconstructions of the cervical spine were also generated. COMPARISON:  None. FINDINGS: CT HEAD FINDINGS Brain: No evidence of acute infarction, hemorrhage, hydrocephalus, extra-axial collection or mass lesion/mass effect. Vascular: Mild intracranial arterial vascular calcifications are present. Skull: Calvarium appears intact. No acute depressed skull fractures. Sinuses/Orbits: Paranasal sinuses and mastoid air cells are clear. Other: None. CT CERVICAL SPINE FINDINGS Alignment: Straightening of usual cervical lordosis without anterior subluxation. This may be due to patient positioning but ligamentous injury or muscle spasm could also have this appearance. Normal alignment of the facet joints. C1-2 articulation appears intact. Skull base and vertebrae: Skull base appears intact. No vertebral compression deformities. No focal bone lesion or bone destruction. Bone cortex and trabecular architecture appear intact. Soft tissues and spinal canal: No prevertebral soft tissue swelling. No paraspinal soft tissue mass or infiltration. Disc levels: Degenerative disc space narrowing and prominent hypertrophic changes posteriorly at T1-2. Degenerative changes also at C1-2. Upper chest: Lung apices are clear. Other: None. IMPRESSION: 1. No acute intracranial abnormalities. 2. Nonspecific straightening of usual cervical lordosis. No acute displaced fractures identified in the cervical spine. Electronically Signed   By: Lucienne Capers M.D.   On: 10/01/2018 21:47   Dg Knee Complete 4 Views Right  Result Date: 10/01/2018 CLINICAL DATA:  Right knee pain since a fall today. Initial encounter. EXAM: RIGHT KNEE - COMPLETE 4+ VIEW COMPARISON:  None. FINDINGS: No evidence of fracture, dislocation, or joint  effusion. No evidence of arthropathy or other focal bone abnormality. Soft tissues are unremarkable. IMPRESSION: Negative exam. Electronically Signed   By: Inge Rise M.D.   On: 10/01/2018 21:45    Procedures Procedures (including critical care time)  Medications Ordered in ED Medications  oxyCODONE-acetaminophen (PERCOCET/ROXICET) 5-325 MG per tablet 1 tablet (1 tablet Oral Given 10/01/18 2116)     Initial Impression / Assessment and Plan / ED Course  I have reviewed the triage vital signs and the nursing notes.  Pertinent labs & imaging results that were available during my care of the patient were reviewed by me and considered in my medical decision making (see chart for details).     Imaging reviewed and discussed with patient.  No acute findings from today's fall.  Discussed home treatment including rest, ice, heat therapy.  She is unable to tolerate NSAIDs given her past medical history including her renal disease.  She was prescribed a few Percocet after the [place] narcotic database was reviewed.  She was also prescribed Flexeril which may be helpful, discussed to watch her symptoms and if she develops muscle spasm may add the Flexeril as well.  Advised follow-up with her PCP for any new or persistent symptoms.  Final Clinical Impressions(s) / ED Diagnoses   Final diagnoses:  Fall, initial encounter  Acute pain of right knee  Acute strain of neck muscle, initial encounter    ED Discharge Orders         Ordered    oxyCODONE-acetaminophen (PERCOCET/ROXICET) 5-325 MG tablet  Every 4 hours PRN     10/01/18 2229    cyclobenzaprine (  FLEXERIL) 5 MG tablet  3 times daily PRN     10/01/18 2241           Evalee Jefferson, PA-C 10/03/18 1256    Daleen Bo, MD 10/04/18 [phone]

## 2018-10-08 DIAGNOSIS — E1165 Type 2 diabetes mellitus with hyperglycemia: Secondary | ICD-10-CM | POA: Diagnosis not present

## 2018-10-08 DIAGNOSIS — E7800 Pure hypercholesterolemia, unspecified: Secondary | ICD-10-CM | POA: Diagnosis not present

## 2018-10-08 DIAGNOSIS — E782 Mixed hyperlipidemia: Secondary | ICD-10-CM | POA: Diagnosis not present

## 2018-10-08 DIAGNOSIS — M79671 Pain in right foot: Secondary | ICD-10-CM | POA: Diagnosis not present

## 2018-10-08 DIAGNOSIS — E1140 Type 2 diabetes mellitus with diabetic neuropathy, unspecified: Secondary | ICD-10-CM | POA: Diagnosis not present

## 2018-10-08 DIAGNOSIS — I10 Essential (primary) hypertension: Secondary | ICD-10-CM | POA: Diagnosis not present

## 2018-10-08 DIAGNOSIS — M25561 Pain in right knee: Secondary | ICD-10-CM | POA: Diagnosis not present

## 2018-10-08 DIAGNOSIS — Z6833 Body mass index (BMI) 33.0-33.9, adult: Secondary | ICD-10-CM | POA: Diagnosis not present

## 2018-10-08 DIAGNOSIS — E039 Hypothyroidism, unspecified: Secondary | ICD-10-CM | POA: Diagnosis not present

## 2018-10-14 DIAGNOSIS — I10 Essential (primary) hypertension: Secondary | ICD-10-CM | POA: Diagnosis not present

## 2018-10-14 DIAGNOSIS — Z0001 Encounter for general adult medical examination with abnormal findings: Secondary | ICD-10-CM | POA: Diagnosis not present

## 2018-10-14 DIAGNOSIS — Z6833 Body mass index (BMI) 33.0-33.9, adult: Secondary | ICD-10-CM | POA: Diagnosis not present

## 2018-11-20 DIAGNOSIS — R69 Illness, unspecified: Secondary | ICD-10-CM | POA: Diagnosis not present

## 2018-11-23 ENCOUNTER — Other Ambulatory Visit: Payer: Self-pay

## 2018-11-23 ENCOUNTER — Encounter (HOSPITAL_COMMUNITY): Payer: Self-pay | Admitting: Emergency Medicine

## 2018-11-23 ENCOUNTER — Emergency Department (HOSPITAL_COMMUNITY)
Admission: EM | Admit: 2018-11-23 | Discharge: 2018-11-23 | Disposition: A | Discharge status: Home / Self Care | Payer: Medicare HMO | Attending: Emergency Medicine | Admitting: Emergency Medicine

## 2018-11-23 DIAGNOSIS — Z955 Presence of coronary angioplasty implant and graft: Secondary | ICD-10-CM | POA: Insufficient documentation

## 2018-11-23 DIAGNOSIS — Z87891 Personal history of nicotine dependence: Secondary | ICD-10-CM | POA: Diagnosis not present

## 2018-11-23 DIAGNOSIS — E039 Hypothyroidism, unspecified: Secondary | ICD-10-CM | POA: Diagnosis not present

## 2018-11-23 DIAGNOSIS — Z951 Presence of aortocoronary bypass graft: Secondary | ICD-10-CM | POA: Diagnosis not present

## 2018-11-23 DIAGNOSIS — Z79899 Other long term (current) drug therapy: Secondary | ICD-10-CM | POA: Insufficient documentation

## 2018-11-23 DIAGNOSIS — E1122 Type 2 diabetes mellitus with diabetic chronic kidney disease: Secondary | ICD-10-CM | POA: Insufficient documentation

## 2018-11-23 DIAGNOSIS — I2510 Atherosclerotic heart disease of native coronary artery without angina pectoris: Secondary | ICD-10-CM | POA: Insufficient documentation

## 2018-11-23 DIAGNOSIS — M5442 Lumbago with sciatica, left side: Secondary | ICD-10-CM | POA: Insufficient documentation

## 2018-11-23 DIAGNOSIS — N183 Chronic kidney disease, stage 3 (moderate): Secondary | ICD-10-CM | POA: Insufficient documentation

## 2018-11-23 DIAGNOSIS — I129 Hypertensive chronic kidney disease with stage 1 through stage 4 chronic kidney disease, or unspecified chronic kidney disease: Secondary | ICD-10-CM | POA: Diagnosis not present

## 2018-11-23 DIAGNOSIS — J45909 Unspecified asthma, uncomplicated: Secondary | ICD-10-CM | POA: Diagnosis not present

## 2018-11-23 DIAGNOSIS — Z7982 Long term (current) use of aspirin: Secondary | ICD-10-CM | POA: Insufficient documentation

## 2018-11-23 DIAGNOSIS — M79672 Pain in left foot: Secondary | ICD-10-CM

## 2018-11-23 DIAGNOSIS — M5441 Lumbago with sciatica, right side: Secondary | ICD-10-CM | POA: Diagnosis not present

## 2018-11-23 LAB — URINALYSIS, ROUTINE W REFLEX MICROSCOPIC
Bilirubin Urine: NEGATIVE
Glucose, UA: 150 mg/dL — AB
Hgb urine dipstick: NEGATIVE
Ketones, ur: NEGATIVE mg/dL
Leukocytes,Ua: NEGATIVE
Nitrite: NEGATIVE
Protein, ur: >=300 mg/dL — AB
Specific Gravity, Urine: 1.02 (ref 1.005–1.030)
pH: 5 (ref 5.0–8.0)

## 2018-11-23 MED ORDER — HYDROCODONE-ACETAMINOPHEN 5-325 MG PO TABS: 2.0000 | ORAL_TABLET | ORAL | 0 refills | Status: DC | PRN

## 2018-11-23 MED ORDER — LORAZEPAM 1 MG PO TABS
1.0000 mg | ORAL_TABLET | Freq: Once | ORAL | Status: AC
  Administered 2018-11-23: 1 mg via ORAL
  Filled 2018-11-23: qty 1

## 2018-11-23 MED ORDER — PREDNISONE 20 MG PO TABS: 40.0000 mg | ORAL_TABLET | Freq: Every day | ORAL | 0 refills | Status: DC

## 2018-11-23 MED ORDER — HYDROMORPHONE HCL 1 MG/ML IJ SOLN
1.0000 mg | Freq: Once | INTRAMUSCULAR | Status: AC
  Administered 2018-11-23: 1 mg via INTRAMUSCULAR
  Filled 2018-11-23: qty 1

## 2018-11-23 MED ORDER — DEXAMETHASONE 4 MG PO TABS
8.0000 mg | ORAL_TABLET | Freq: Once | ORAL | Status: AC
  Administered 2018-11-23: 8 mg via ORAL
  Filled 2018-11-23: qty 2

## 2018-11-23 NOTE — ED Triage Notes (Signed)
Low back, LT leg and LT foot pain for past couple of days.  Denies any injury.  Pt also reports chills.  Denies any urinary s/s

## 2018-11-25 LAB — URINE CULTURE: Culture: >=100000 — AB

## 2018-12-06 NOTE — ED Provider Notes (Signed)
[facility] EMERGENCY DEPARTMENT Provider Note   CSN: [id number] Arrival date & time: 11/23/18  1130    History   Chief Complaint Chief Complaint  Patient presents with   Back Pain    HPI Donna Howe is a 63 y.o. female.  HPI   17yF with lower back pain. Radiates into LLE. No trauma/ strain. Worse with movement. No numbness, tingling or loss of strength. No fever, redness or swelling. No urinary complaints. Denies hx of recurrent back problems.   Past Medical History:  Diagnosis Date   Anemia    Asthma    CAD (coronary artery disease)    Multivessel s/p CABG 2005, numerous PCIs since that time   Carotid artery disease ([place])    R CEA   Chronic kidney disease (CKD), stage III (moderate) ([place])    Essential hypertension    Gout    Hyperlipidemia    Hypothyroidism    Myocardial infarction [facility])    PAD (peripheral artery disease) ([place])    Dr. Kellie Simmering   S/P angioplasty with stent- DES to [facility] and to LIMA to LAD with DES 04/09/18.   04/10/2018   SBO (small bowel obstruction) ([place]) 2011   Status post lysis of adhesions & hernia repair   Sinus bradycardia    Thrombocytopenia, unspecified (HCC)    Type 2 diabetes mellitus ([place])    Umbilical hernia     Patient Active Problem List   Diagnosis Date Noted   Unstable angina ([place]) 04/18/2018   Hypothyroidism 04/18/2018   S/P angioplasty with stent- DES to [facility] and to LIM[place] to LAD with DES 04/09/18.   04/10/2018   Angina pectoris ([place]) 04/05/2018   Status post coronary artery stent placement    ACS (acute coronary syndrome) ([place]) 05/31/2017   Acute chest pain    Non-ST elevation (NSTEMI) myocardial infarction [facility])    History of coronary artery disease    Diabetes ([place]) 10/28/2013   Hypoglycemia associated with diabetes ([place]) 10/28/2013   Thrombocytopenia, unspecified ([place]) 10/28/2013   Hypokalemia 10/27/2013   Syncope 10/26/2013   Anemia 10/26/2013   Renal failure, acute on chronic (HCC)  10/26/2013   Fracture of toe of right foot 12/87/8676   Umbilical hernia    Carotid artery disease ([place])    Occlusion and stenosis of carotid artery without mention of cerebral infarction 04/15/2013   Hx of CABG    Ejection fraction    Atherosclerosis of native arteries of the extremities with intermittent claudication 02/11/2013   Diabetes mellitus with renal manifestation ([place]) 01/21/2013   Bradycardia 01/03/2013   Chronic kidney disease (CKD), stage III (moderate) ([place])    Gout    PROTEINURIA, MILD 01/18/2010   TOBACCO ABUSE 08/27/2009   Obesity (BMI 30-39.9) 08/26/2009   Asthma 08/26/2009   Hyperlipidemia 03/31/2007   Essential hypertension 03/31/2007   CAD (coronary artery disease) 03/31/2007    Past Surgical History:  Procedure Laterality Date   CESAREAN SECTION  1984   CHOLECYSTECTOMY  2010   CORONARY ARTERY BYPASS GRAFT  2005   CORONARY BALLOON ANGIOPLASTY N/A 05/31/2017   Procedure: CORONARY BALLOON ANGIOPLASTY;  Surgeon: Jettie Booze, MD;  Location: [place] CV LAB;  Service: Cardiovascular;  Laterality: N/A;   CORONARY STENT INTERVENTION N/A 05/31/2017   Procedure: CORONARY STENT INTERVENTION;  Surgeon: Jettie Booze, MD;  Location: [place] CV LAB;  Service: Cardiovascular;  Laterality: N/A;   CORONARY STENT INTERVENTION N/A 04/09/2018   Procedure: CORONARY STENT INTERVENTION;  Surgeon: Jettie Booze,  MD;  Location: [place] CV LAB;  Service: Cardiovascular;  Laterality: N/A;  SVG RCA   ENDARTERECTOMY Right 04/18/2013   Procedure: ENDARTERECTOMY CAROTID;  Surgeon: Mal Misty, MD;  Location: [place];  Service: Vascular;  Laterality: Right;   HERNIA REPAIR  1989   Incisional hernia repair x2  03/04/2010   Laparoscopic with 35cm mesh by Dr Ronnald Collum   LEFT HEART CATH AND CORS/GRAFTS ANGIOGRAPHY N/A 05/31/2017   Procedure: LEFT HEART CATH AND CORS/GRAFTS ANGIOGRAPHY;  Surgeon: Jettie Booze, MD;  Location: [place] CV LAB;  Service: Cardiovascular;  Laterality: N/A;   LEFT HEART CATH AND CORS/GRAFTS ANGIOGRAPHY N/A 04/08/2018   Procedure: LEFT HEART CATH AND CORS/GRAFTS ANGIOGRAPHY;  Surgeon: Jettie Booze, MD;  Location: [place] CV LAB;  Service: Cardiovascular;  Laterality: N/A;   LEFT HEART CATHETERIZATION WITH CORONARY ANGIOGRAM N/A 12/19/2012   Procedure: LEFT HEART CATHETERIZATION WITH CORONARY ANGIOGRAM;  Surgeon: Josue Hector, MD;  Location: [facility] CATH LAB;  Service: Cardiovascular;  Laterality: N/A;   LEFT HEART CATHETERIZATION WITH CORONARY/GRAFT ANGIOGRAM N/A 04/19/2013   Procedure: LEFT HEART CATHETERIZATION WITH Beatrix Fetters;  Surgeon: Lorretta Harp, MD;  Location: [facility] CATH LAB;  Service: Cardiovascular;  Laterality: N/A;   PATCH ANGIOPLASTY Right 04/18/2013   Procedure: PATCH ANGIOPLASTY Right Internal Carotid Artery;  Surgeon: Mal Misty, MD;  Location: [place];  Service: Vascular;  Laterality: Right;   PERCUTANEOUS CORONARY STENT INTERVENTION (PCI-S) Right 12/19/2012   Procedure: PERCUTANEOUS CORONARY STENT INTERVENTION (PCI-S);  Surgeon: Josue Hector, MD;  Location: [facility] CATH LAB;  Service: Cardiovascular;  Laterality: Right;   SHOULDER SURGERY       OB History   No obstetric history on file.      Home Medications    Prior to Admission medications   Medication Sig Start Date End Date Taking? Authorizing Provider  ALPRAZolam Duanne Moron) 0.5 MG tablet Take 1/2 tab by mouth every morning & evening and 1 tab at bedtime   Yes [provider]  amLODipine (NORVASC) 10 MG tablet Take 1 tablet (10 mg total) by mouth daily. 04/10/18  Yes Isaiah Serge, NP  aspirin EC 81 MG tablet Take 81 mg by mouth daily.   Yes [provider]  atorvastatin (LIPITOR) 80 MG tablet Take 1 tablet (80 mg total) by mouth daily. 06/01/17  Yes Johnson, Clanford L, MD  clopidogrel (PLAVIX) 75 MG tablet Take 1 tablet (75 mg total) by mouth daily. 08/27/18  Yes Satira Sark, MD  colchicine 0.6 MG tablet Take 0.6 mg by mouth daily.    Yes [provider]  gabapentin (NEURONTIN) 400 MG capsule Take 400 mg by mouth at bedtime.    Yes Rory Percy, MD  hydrALAZINE (APRESOLINE) 50 MG tablet Take 50 mg by mouth 2 (two) times daily. 08/22/18  Yes [provider]  isosorbide dinitrate (ISORDIL) 5 MG tablet Take 5 mg by mouth 2 (two) times daily.   Yes [provider]  levothyroxine (SYNTHROID, LEVOTHROID) 125 MCG tablet Take 125 mcg by mouth every morning.   Yes [provider]  nitroGLYCERIN (NITROSTAT) 0.4 MG SL tablet Place 0.4 mg under the tongue every 5 (five) minutes as needed for chest pain. Not to exceed 3 in 15 minute time frame   Yes [provider]  NOVOLIN 70/30 RELION (70-30) 100 UNIT/ML injection Inject 24 Units into the skin 2 (two) times daily with a meal.  08/28/15  Yes [provider]  ondansetron (ZOFRAN ODT) 8 MG disintegrating tablet Take 1 tablet (8 mg total) by mouth every 8 (eight) hours as needed for nausea or vomiting. 08/14/15  Yes Idol, Almyra Free, PA-C  VENTOLIN HFA 108 (90 Base) MCG/ACT inhaler INHALE 2 PUFFS BY MOUTH 4 TIMES DAILY AS NEEDED FOR SHORTNESS OF BREATH 04/13/18  Yes [provider]  HYDROcodone-acetaminophen (NORCO/VICODIN) 5-325 MG tablet Take 2 tablets by mouth every 4 (four) hours as needed. 11/23/18   Virgel Manifold, MD  predniSONE (DELTASONE) 20 MG tablet Take 2 tablets (40 mg total) by mouth daily. 11/23/18   Virgel Manifold, MD    Family History Family History  Problem Relation Age of Onset   Diabetes Mother    Heart disease Mother        before age 63   Hyperlipidemia Mother    Hypertension Mother    Thyroid disease Father    Hypertension Father    AAA (abdominal aortic aneurysm) Father    Heart disease Brother        before age 65   Hypertension Brother    Hyperlipidemia Son    Hypertension Son     Social History Social History   Tobacco  Use   Smoking status: Former Smoker    Packs/day: 1.00    Years: 20.00    Pack years: 20.00    Types: Cigarettes    Last attempt to quit: 12/10/2012    Years since quitting: 5.9   Smokeless tobacco: Never Used  Substance Use Topics   Alcohol use: No    Alcohol/week: 0.0 standard drinks   Drug use: No     Allergies   Penicillins   Review of Systems Review of Systems  All systems reviewed and negative, other than as noted in HPI.  Physical Exam Updated Vital Signs BP 103/63 (BP Location: Left Arm)    Pulse 76    Temp 97.9 F (36.6 C) (Oral)    Resp 16    Ht 5\' 5"  (1.651 m)    Wt 90.7 kg    SpO2 94%    BMI 33.28 kg/m   Physical Exam Vitals signs and nursing note reviewed.  Constitutional:      General: She is not in acute distress.    Appearance: She is well-developed.  HENT:     Head: Normocephalic and atraumatic.  Eyes:     General:        Right eye: No discharge.        Left eye: No discharge.     Conjunctiva/sclera: Conjunctivae normal.  Neck:     Musculoskeletal: Neck supple.  Cardiovascular:     Rate and Rhythm: Normal rate and regular rhythm.     Heart sounds: Normal heart sounds. No murmur. No friction rub. No gallop.   Pulmonary:     Effort: Pulmonary effort is normal. No respiratory distress.     Breath sounds: Normal breath sounds.  Abdominal:     General: There is no distension.     Palpations: Abdomen is soft.     Tenderness: There is no abdominal tenderness.  Musculoskeletal:        General: No tenderness.     Comments: Strength 5/5 b/l LE. LE symmetric as compared to each other. Sensation intact to light touch. Palpable DP pulses.   Skin:    General: Skin is warm and dry.  Neurological:     Mental Status: She is alert.  Psychiatric:        Behavior: Behavior normal.  Thought Content: Thought content normal.      ED Treatments / Results  Labs (all labs ordered are listed, but only abnormal results are displayed) Labs Reviewed   URINE CULTURE - Abnormal; Notable for the following components:      Result Value   Culture >=100,000 COLONIES/mL LACTOBACILLUS SPECIES (*)    All other components within normal limits  URINALYSIS, ROUTINE W REFLEX MICROSCOPIC - Abnormal; Notable for the following components:   APPearance CLOUDY (*)    Glucose, UA 150 (*)    Protein, ur >=300 (*)    Bacteria, UA RARE (*)    All other components within normal limits    EKG None  Radiology No results found.  Procedures Procedures (including critical care time)  Medications Ordered in ED Medications  dexamethasone (DECADRON) tablet 8 mg (8 mg Oral Given 11/23/18 1332)  HYDROmorphone (DILAUDID) injection 1 mg (1 mg Intramuscular Given 11/23/18 1334)  LORazepam (ATIVAN) tablet 1 mg (1 mg Oral Given 11/23/18 1333)     Initial Impression / Assessment and Plan / ED Course  I have reviewed the triage vital signs and the nursing notes.  Pertinent labs & imaging results that were available during my care of the patient were reviewed by me and considered in my medical decision making (see chart for details).   62yF with radicular lower back pain. Nonfocal neuro exam. Vascularly intact. Doubt AAA, causa equina, pyelonephritis, etc. Plan symptomatic tx. Return precautions discussed.   Final Clinical Impressions(s) / ED Diagnoses   Final diagnoses:  Acute low back pain with left-sided sciatica, unspecified back pain laterality  Left foot pain    ED Discharge Orders         Ordered    predniSONE (DELTASONE) 20 MG tablet  Daily     11/23/18 1513    HYDROcodone-acetaminophen (NORCO/VICODIN) 5-325 MG tablet  Every 4 hours PRN     11/23/18 1513           Virgel Manifold, MD 12/06/18 1228

## 2018-12-16 DIAGNOSIS — I10 Essential (primary) hypertension: Secondary | ICD-10-CM | POA: Diagnosis not present

## 2018-12-16 DIAGNOSIS — E7800 Pure hypercholesterolemia, unspecified: Secondary | ICD-10-CM | POA: Diagnosis not present

## 2018-12-16 DIAGNOSIS — E039 Hypothyroidism, unspecified: Secondary | ICD-10-CM | POA: Diagnosis not present

## 2018-12-16 DIAGNOSIS — I2510 Atherosclerotic heart disease of native coronary artery without angina pectoris: Secondary | ICD-10-CM | POA: Diagnosis not present

## 2018-12-19 DIAGNOSIS — J209 Acute bronchitis, unspecified: Secondary | ICD-10-CM | POA: Diagnosis not present

## 2019-01-02 DIAGNOSIS — R69 Illness, unspecified: Secondary | ICD-10-CM | POA: Diagnosis not present

## 2019-02-15 DIAGNOSIS — I10 Essential (primary) hypertension: Secondary | ICD-10-CM | POA: Diagnosis not present

## 2019-02-15 DIAGNOSIS — E7800 Pure hypercholesterolemia, unspecified: Secondary | ICD-10-CM | POA: Diagnosis not present

## 2019-02-15 DIAGNOSIS — E039 Hypothyroidism, unspecified: Secondary | ICD-10-CM | POA: Diagnosis not present

## 2019-03-18 DIAGNOSIS — E7800 Pure hypercholesterolemia, unspecified: Secondary | ICD-10-CM | POA: Diagnosis not present

## 2019-03-18 DIAGNOSIS — I10 Essential (primary) hypertension: Secondary | ICD-10-CM | POA: Diagnosis not present

## 2019-03-18 DIAGNOSIS — E1140 Type 2 diabetes mellitus with diabetic neuropathy, unspecified: Secondary | ICD-10-CM | POA: Diagnosis not present

## 2019-04-01 DIAGNOSIS — I10 Essential (primary) hypertension: Secondary | ICD-10-CM | POA: Diagnosis not present

## 2019-04-01 DIAGNOSIS — E1140 Type 2 diabetes mellitus with diabetic neuropathy, unspecified: Secondary | ICD-10-CM | POA: Diagnosis not present

## 2019-04-01 DIAGNOSIS — E1165 Type 2 diabetes mellitus with hyperglycemia: Secondary | ICD-10-CM | POA: Diagnosis not present

## 2019-04-01 DIAGNOSIS — E039 Hypothyroidism, unspecified: Secondary | ICD-10-CM | POA: Diagnosis not present

## 2019-04-01 DIAGNOSIS — E7800 Pure hypercholesterolemia, unspecified: Secondary | ICD-10-CM | POA: Diagnosis not present

## 2019-04-07 DIAGNOSIS — E039 Hypothyroidism, unspecified: Secondary | ICD-10-CM | POA: Diagnosis not present

## 2019-04-07 DIAGNOSIS — Z6833 Body mass index (BMI) 33.0-33.9, adult: Secondary | ICD-10-CM | POA: Diagnosis not present

## 2019-04-07 DIAGNOSIS — Z1331 Encounter for screening for depression: Secondary | ICD-10-CM | POA: Diagnosis not present

## 2019-04-07 DIAGNOSIS — I10 Essential (primary) hypertension: Secondary | ICD-10-CM | POA: Diagnosis not present

## 2019-04-07 DIAGNOSIS — I2510 Atherosclerotic heart disease of native coronary artery without angina pectoris: Secondary | ICD-10-CM | POA: Diagnosis not present

## 2019-04-07 DIAGNOSIS — E1165 Type 2 diabetes mellitus with hyperglycemia: Secondary | ICD-10-CM | POA: Diagnosis not present

## 2019-04-07 DIAGNOSIS — E7800 Pure hypercholesterolemia, unspecified: Secondary | ICD-10-CM | POA: Diagnosis not present

## 2019-04-18 DIAGNOSIS — E785 Hyperlipidemia, unspecified: Secondary | ICD-10-CM | POA: Diagnosis not present

## 2019-04-18 DIAGNOSIS — I10 Essential (primary) hypertension: Secondary | ICD-10-CM | POA: Diagnosis not present

## 2019-04-18 DIAGNOSIS — I2510 Atherosclerotic heart disease of native coronary artery without angina pectoris: Secondary | ICD-10-CM | POA: Diagnosis not present

## 2019-05-07 DIAGNOSIS — R69 Illness, unspecified: Secondary | ICD-10-CM | POA: Diagnosis not present

## 2019-05-16 DIAGNOSIS — I10 Essential (primary) hypertension: Secondary | ICD-10-CM | POA: Diagnosis not present

## 2019-05-19 DIAGNOSIS — E1140 Type 2 diabetes mellitus with diabetic neuropathy, unspecified: Secondary | ICD-10-CM | POA: Diagnosis not present

## 2019-05-19 DIAGNOSIS — I10 Essential (primary) hypertension: Secondary | ICD-10-CM | POA: Diagnosis not present

## 2019-05-19 DIAGNOSIS — R69 Illness, unspecified: Secondary | ICD-10-CM | POA: Diagnosis not present

## 2019-05-22 DIAGNOSIS — I10 Essential (primary) hypertension: Secondary | ICD-10-CM | POA: Diagnosis not present

## 2019-05-22 DIAGNOSIS — R0989 Other specified symptoms and signs involving the circulatory and respiratory systems: Secondary | ICD-10-CM | POA: Diagnosis not present

## 2019-05-22 DIAGNOSIS — Z6835 Body mass index (BMI) 35.0-35.9, adult: Secondary | ICD-10-CM | POA: Diagnosis not present

## 2019-05-28 DIAGNOSIS — R0989 Other specified symptoms and signs involving the circulatory and respiratory systems: Secondary | ICD-10-CM | POA: Diagnosis not present

## 2019-05-28 DIAGNOSIS — I6523 Occlusion and stenosis of bilateral carotid arteries: Secondary | ICD-10-CM | POA: Diagnosis not present

## 2019-06-09 ENCOUNTER — Other Ambulatory Visit: Payer: Self-pay

## 2019-06-09 ENCOUNTER — Emergency Department (HOSPITAL_COMMUNITY)
Admission: EM | Admit: 2019-06-09 | Discharge: 2019-06-10 | Disposition: A | Discharge status: Home / Self Care | Payer: Medicare HMO | Attending: Emergency Medicine | Admitting: Emergency Medicine

## 2019-06-09 ENCOUNTER — Emergency Department (HOSPITAL_COMMUNITY): Payer: Medicare HMO

## 2019-06-09 DIAGNOSIS — I129 Hypertensive chronic kidney disease with stage 1 through stage 4 chronic kidney disease, or unspecified chronic kidney disease: Secondary | ICD-10-CM | POA: Insufficient documentation

## 2019-06-09 DIAGNOSIS — Z7982 Long term (current) use of aspirin: Secondary | ICD-10-CM | POA: Insufficient documentation

## 2019-06-09 DIAGNOSIS — Z951 Presence of aortocoronary bypass graft: Secondary | ICD-10-CM | POA: Insufficient documentation

## 2019-06-09 DIAGNOSIS — I252 Old myocardial infarction: Secondary | ICD-10-CM | POA: Insufficient documentation

## 2019-06-09 DIAGNOSIS — E039 Hypothyroidism, unspecified: Secondary | ICD-10-CM | POA: Diagnosis not present

## 2019-06-09 DIAGNOSIS — S99921A Unspecified injury of right foot, initial encounter: Secondary | ICD-10-CM | POA: Insufficient documentation

## 2019-06-09 DIAGNOSIS — N183 Chronic kidney disease, stage 3 (moderate): Secondary | ICD-10-CM | POA: Insufficient documentation

## 2019-06-09 DIAGNOSIS — Y92010 Kitchen of single-family (private) house as the place of occurrence of the external cause: Secondary | ICD-10-CM | POA: Diagnosis not present

## 2019-06-09 DIAGNOSIS — J45909 Unspecified asthma, uncomplicated: Secondary | ICD-10-CM | POA: Diagnosis not present

## 2019-06-09 DIAGNOSIS — Z87891 Personal history of nicotine dependence: Secondary | ICD-10-CM | POA: Diagnosis not present

## 2019-06-09 DIAGNOSIS — Z79899 Other long term (current) drug therapy: Secondary | ICD-10-CM | POA: Diagnosis not present

## 2019-06-09 DIAGNOSIS — W208XXA Other cause of strike by thrown, projected or falling object, initial encounter: Secondary | ICD-10-CM | POA: Diagnosis not present

## 2019-06-09 DIAGNOSIS — Y93G1 Activity, food preparation and clean up: Secondary | ICD-10-CM | POA: Insufficient documentation

## 2019-06-09 DIAGNOSIS — I2510 Atherosclerotic heart disease of native coronary artery without angina pectoris: Secondary | ICD-10-CM | POA: Insufficient documentation

## 2019-06-09 DIAGNOSIS — S9031XA Contusion of right foot, initial encounter: Secondary | ICD-10-CM | POA: Diagnosis not present

## 2019-06-09 DIAGNOSIS — E1122 Type 2 diabetes mellitus with diabetic chronic kidney disease: Secondary | ICD-10-CM | POA: Diagnosis not present

## 2019-06-09 DIAGNOSIS — Y998 Other external cause status: Secondary | ICD-10-CM | POA: Diagnosis not present

## 2019-06-09 MED ORDER — OXYCODONE-ACETAMINOPHEN 5-325 MG PO TABS: 1.0000 | ORAL_TABLET | ORAL | 0 refills | Status: AC | PRN

## 2019-06-09 MED ORDER — ACETAMINOPHEN 500 MG PO TABS
1000.0000 mg | ORAL_TABLET | Freq: Once | ORAL | Status: AC
  Administered 2019-06-09: 1000 mg via ORAL
  Filled 2019-06-09: qty 2

## 2019-06-09 NOTE — ED Provider Notes (Signed)
[facility] EMERGENCY DEPARTMENT Provider Note   CSN: [id number] Arrival date & time: 06/09/19  2054     History   Chief Complaint Chief Complaint  Patient presents with  . Toe Injury    HPI Donna Howe is a 63 y.o. female.     63 y.o female with a PMH of Anemia, Asthma, CAD presents to the ED status post right foot injury.  Patient reports she was defrosting meat when a block of frozen meat fell on top of her right long toe.  She reports bruising noted to the toe, this is worse with palpation and ambulation.  She does report prior fracture to the same toe.  Patient reports she was previously placed on a boot in order to help with her toe.  She has not taken any medication for relieving symptoms.  Was ambulatory in the ED.  Denies any other trauma or wounds.  The history is provided by the patient.    Past Medical History:  Diagnosis Date  . Anemia   . Asthma   . CAD (coronary artery disease)    Multivessel s/p CABG 2005, numerous PCIs since that time  . Carotid artery disease (HCC)    R CEA  . Chronic kidney disease (CKD), stage III (moderate) (HCC)   . Essential hypertension   . Gout   . Hyperlipidemia   . Hypothyroidism   . Myocardial infarction ([place])   . PAD (peripheral artery disease) ([place])    Dr. Kellie Simmering  . S/P angioplasty with stent- DES to [facility] and to [facility] to LAD with DES 04/09/18.   04/10/2018  . SBO (small bowel obstruction) ([place]) 2011   Status post lysis of adhesions & hernia repair  . Sinus bradycardia   . Thrombocytopenia, unspecified ([place])   . Type 2 diabetes mellitus ([place])   . Umbilical hernia     Patient Active Problem List   Diagnosis Date Noted  . Unstable angina ([place]) 04/18/2018  . Hypothyroidism 04/18/2018  . S/P angioplasty with stent- DES to [facility] and to [place] to LAD with DES 04/09/18.   04/10/2018  . Angina pectoris ([place]) 04/05/2018  . Status post coronary artery stent placement   . ACS (acute coronary syndrome) ([place]) 05/31/2017  . Acute  chest pain   . Non-ST elevation (NSTEMI) myocardial infarction ([place])   . History of coronary artery disease   . Diabetes ([place]) 10/28/2013  . Hypoglycemia associated with diabetes ([place]) 10/28/2013  . Thrombocytopenia, unspecified ([place]) 10/28/2013  . Hypokalemia 10/27/2013  . Syncope 10/26/2013  . Anemia 10/26/2013  . Renal failure, acute on chronic (HCC) 10/26/2013  . Fracture of toe of right foot 10/26/2013  . Umbilical hernia   . Carotid artery disease ([place])   . Occlusion and stenosis of carotid artery without mention of cerebral infarction 04/15/2013  . Hx of CABG   . Ejection fraction   . Atherosclerosis of native arteries of the extremities with intermittent claudication 02/11/2013  . Diabetes mellitus with renal manifestation ([place]) 01/21/2013  . Bradycardia 01/03/2013  . Chronic kidney disease (CKD), stage III (moderate) (HCC)   . Gout   . PROTEINURIA, MILD 01/18/2010  . TOBACCO ABUSE 08/27/2009  . Obesity (BMI 30-39.9) 08/26/2009  . Asthma 08/26/2009  . Hyperlipidemia 03/31/2007  . Essential hypertension 03/31/2007  . CAD (coronary artery disease) 03/31/2007    Past Surgical History:  Procedure Laterality Date  . CESAREAN SECTION  1984  . CHOLECYSTECTOMY  2010  . CORONARY ARTERY BYPASS GRAFT  2005  .  CORONARY BALLOON ANGIOPLASTY N/A 05/31/2017   Procedure: CORONARY BALLOON ANGIOPLASTY;  Surgeon: Jettie Booze, MD;  Location: [place] CV LAB;  Service: Cardiovascular;  Laterality: N/A;  . CORONARY STENT INTERVENTION N/A 05/31/2017   Procedure: CORONARY STENT INTERVENTION;  Surgeon: Jettie Booze, MD;  Location: [place] CV LAB;  Service: Cardiovascular;  Laterality: N/A;  . CORONARY STENT INTERVENTION N/A 04/09/2018   Procedure: CORONARY STENT INTERVENTION;  Surgeon: Jettie Booze, MD;  Location: [place] CV LAB;  Service: Cardiovascular;  Laterality: N/A;  SVG RCA  . ENDARTERECTOMY Right 04/18/2013   Procedure: ENDARTERECTOMY CAROTID;  Surgeon:  Mal Misty, MD;  Location: [place];  Service: Vascular;  Laterality: Right;  . HERNIA REPAIR  1989  . Incisional hernia repair x2  03/04/2010   Laparoscopic with 35cm mesh by Dr Ronnald Collum  . LEFT HEART CATH AND CORS/GRAFTS ANGIOGRAPHY N/A 05/31/2017   Procedure: LEFT HEART CATH AND CORS/GRAFTS ANGIOGRAPHY;  Surgeon: Jettie Booze, MD;  Location: [place] CV LAB;  Service: Cardiovascular;  Laterality: N/A;  . LEFT HEART CATH AND CORS/GRAFTS ANGIOGRAPHY N/A 04/08/2018   Procedure: LEFT HEART CATH AND CORS/GRAFTS ANGIOGRAPHY;  Surgeon: Jettie Booze, MD;  Location: [place] CV LAB;  Service: Cardiovascular;  Laterality: N/A;  . LEFT HEART CATHETERIZATION WITH CORONARY ANGIOGRAM N/A 12/19/2012   Procedure: LEFT HEART CATHETERIZATION WITH CORONARY ANGIOGRAM;  Surgeon: Josue Hector, MD;  Location: [facility] CATH LAB;  Service: Cardiovascular;  Laterality: N/A;  . LEFT HEART CATHETERIZATION WITH CORONARY/GRAFT ANGIOGRAM N/A 04/19/2013   Procedure: LEFT HEART CATHETERIZATION WITH Beatrix Fetters;  Surgeon: Lorretta Harp, MD;  Location: [facility] CATH LAB;  Service: Cardiovascular;  Laterality: N/A;  . PATCH ANGIOPLASTY Right 04/18/2013   Procedure: PATCH ANGIOPLASTY Right Internal Carotid Artery;  Surgeon: Mal Misty, MD;  Location: [place];  Service: Vascular;  Laterality: Right;  . PERCUTANEOUS CORONARY STENT INTERVENTION (PCI-S) Right 12/19/2012   Procedure: PERCUTANEOUS CORONARY STENT INTERVENTION (PCI-S);  Surgeon: Josue Hector, MD;  Location: [facility] CATH LAB;  Service: Cardiovascular;  Laterality: Right;  . SHOULDER SURGERY       OB History   No obstetric history on file.      Home Medications    Prior to Admission medications   Medication Sig Start Date End Date Taking? Authorizing Provider  ALPRAZolam Duanne Moron) 0.5 MG tablet Take 0.25-0.5 mg by mouth See admin instructions. Take 1/2 tab by mouth every morning & evening and 1 tab at bedtime    Yes [provider]   amLODipine (NORVASC) 10 MG tablet Take 1 tablet (10 mg total) by mouth daily. Patient taking differently: Take 5 mg by mouth 2 (two) times daily.  04/10/18  Yes Isaiah Serge, NP  aspirin EC 81 MG tablet Take 81 mg by mouth every morning.    Yes [provider]  atorvastatin (LIPITOR) 80 MG tablet Take 1 tablet (80 mg total) by mouth daily. 06/01/17  Yes Johnson, Clanford L, MD  clopidogrel (PLAVIX) 75 MG tablet Take 1 tablet (75 mg total) by mouth daily. 08/27/18  Yes Satira Sark, MD  colchicine 0.6 MG tablet Take 0.6 mg by mouth daily.    Yes [provider]  gabapentin (NEURONTIN) 400 MG capsule Take 400 mg by mouth 2 (two) times daily.    Yes Rory Percy, MD  hydrALAZINE (APRESOLINE) 25 MG tablet Take 75 mg by mouth 3 (three) times daily.  08/22/18  Yes [provider]  isosorbide dinitrate (  ISORDIL) 5 MG tablet Take 5 mg by mouth 2 (two) times daily.   Yes [provider]  levothyroxine (SYNTHROID, LEVOTHROID) 125 MCG tablet Take 125 mcg by mouth every morning.   Yes [provider]  nitroGLYCERIN (NITROSTAT) 0.4 MG SL tablet Place 0.4 mg under the tongue every 5 (five) minutes as needed for chest pain. Not to exceed 3 in 15 minute time frame   Yes [provider]  NOVOLIN 70/30 RELION (70-30) 100 UNIT/ML injection Inject 24 Units into the skin 2 (two) times daily with a meal.  08/28/15  Yes [provider]  ondansetron (ZOFRAN ODT) 8 MG disintegrating tablet Take 1 tablet (8 mg total) by mouth every 8 (eight) hours as needed for nausea or vomiting. 08/14/15  Yes Idol, Almyra Free, PA-C  oxyCODONE-acetaminophen (PERCOCET/ROXICET) 5-325 MG tablet Take 1 tablet by mouth every 4 (four) hours as needed for up to 3 days for severe pain. 06/09/19 06/12/19  Janeece Fitting, PA-C  VENTOLIN HFA 108 (90 Base) MCG/ACT inhaler Inhale 2 puffs into the lungs every 6 (six) hours as needed for wheezing or shortness of breath.  04/13/18   [provider]    Family History Family History  Problem Relation Age of Onset  . Diabetes Mother   . Heart disease Mother        before age 55  . Hyperlipidemia Mother   . Hypertension Mother   . Thyroid disease Father   . Hypertension Father   . AAA (abdominal aortic aneurysm) Father   . Heart disease Brother        before age 52  . Hypertension Brother   . Hyperlipidemia Son   . Hypertension Son     Social History Social History   Tobacco Use  . Smoking status: Former Smoker    Packs/day: 1.00    Years: 20.00    Pack years: 20.00    Types: Cigarettes    Quit date: 12/10/2012    Years since quitting: 6.4  . Smokeless tobacco: Never Used  Substance Use Topics  . Alcohol use: No    Alcohol/week: 0.0 standard drinks  . Drug use: No     Allergies   Penicillins   Review of Systems Review of Systems  Constitutional: Negative for fatigue.  Musculoskeletal: Positive for arthralgias.     Physical Exam Updated Vital Signs BP (!) 154/70 (BP Location: Left Arm)   Pulse 76   Temp 97.6 F (36.4 C) (Oral)   Resp 16   Ht 5\' 5"  (1.651 m)   Wt 93 kg   SpO2 98%   BMI 34.11 kg/m   Physical Exam Vitals signs and nursing note reviewed.  Constitutional:      Appearance: Normal appearance.  HENT:     Head: Normocephalic and atraumatic.     Mouth/Throat:     Mouth: Mucous membranes are moist.  Neck:     Musculoskeletal: Normal range of motion and neck supple.  Cardiovascular:     Rate and Rhythm: Normal rate.     Pulses: Normal pulses.          Dorsalis pedis pulses are 2+ on the right side.       Posterior tibial pulses are 2+ on the right side.  Pulmonary:     Effort: Pulmonary effort is normal.  Abdominal:     General: Abdomen is flat.     Palpations: Abdomen is soft.  Musculoskeletal:        General: Tenderness present.  Feet:  Feet:     Right foot:     Skin integrity: Erythema, callus and dry skin present.     Toenail Condition: Right  toenails are normal.  Neurological:     Mental Status: She is alert and oriented to person, place, and time.      ED Treatments / Results  Labs (all labs ordered are listed, but only abnormal results are displayed) Labs Reviewed - No data to display  EKG None  Radiology Dg Toe 2nd Right  Result Date: 06/09/2019 CLINICAL DATA:  63 year old female with trauma to the right foot. Pain, bruising, and limited range of motion of the second toe after frozen steak fell on the foot. EXAM: RIGHT SECOND TOE COMPARISON:  None. FINDINGS: There is no acute fracture or dislocation. The bones are mildly osteopenic. No significant degenerative changes. The soft tissues are grossly unremarkable. No radiopaque foreign object or soft tissue gas. IMPRESSION: Negative. Electronically Signed   By: Anner Crete M.D.   On: 06/09/2019 22:11    Procedures Procedures (including critical care time)  Medications Ordered in ED Medications  acetaminophen (TYLENOL) tablet 1,000 mg (1,000 mg Oral Given 06/09/19 2222)     Initial Impression / Assessment and Plan / ED Course  I have reviewed the triage vital signs and the nursing notes.  Pertinent labs & imaging results that were available during my care of the patient were reviewed by me and considered in my medical decision making (see chart for details).       Patient with extensive past medical history presents to the ED after injuring her right second toe with a pack of frozen meat, reports pain to the area, bruising and hematoma noted to the area but no lacerations are present.  Neurovascular intact.  X-ray obtained which showed:  No fracture or dislocation, provided with Tylenol to help with her pain.  Patient was placed in a postop shoe as she likely reinjure her foot.  She was advised to follow-up with PCP or Ortho to obtain a second x-ray within 7 days to rule out any occult fracture.  Patient understands and agrees with management, is requesting  narcotic medication to help with sleep.  I have reviewed the PDMP, will prescribe a short course of Percocet to help with symptoms.   Portions of this note were generated with [employer]. Dictation errors may occur despite best attempts at proofreading.  Final Clinical Impressions(s) / ED Diagnoses   Final diagnoses:  Injury of toe on right foot, initial encounter    ED Discharge Orders         Ordered    oxyCODONE-acetaminophen (PERCOCET/ROXICET) 5-325 MG tablet  Every 4 hours PRN     06/09/19 2314           Janeece Fitting, PA-C 06/09/19 2314    Milton Ferguson, MD 06/12/19 1111

## 2019-06-09 NOTE — Discharge Instructions (Addendum)
Your x-ray today was normal.  You may follow-up with orthopedist as needed if your symptoms do not improve.  I have provided a short course of pain medication to help with your symptoms.  Please take this for severe pain.

## 2019-06-09 NOTE — ED Triage Notes (Signed)
Pt c/o pain to right second toe after frozen meat fell on her foot from the top shelf; pt has swelling and bruising to second toe

## 2019-06-11 DIAGNOSIS — R69 Illness, unspecified: Secondary | ICD-10-CM | POA: Diagnosis not present

## 2019-06-18 DIAGNOSIS — E7800 Pure hypercholesterolemia, unspecified: Secondary | ICD-10-CM | POA: Diagnosis not present

## 2019-06-18 DIAGNOSIS — I10 Essential (primary) hypertension: Secondary | ICD-10-CM | POA: Diagnosis not present

## 2019-06-18 DIAGNOSIS — E1165 Type 2 diabetes mellitus with hyperglycemia: Secondary | ICD-10-CM | POA: Diagnosis not present

## 2019-07-11 ENCOUNTER — Telehealth: Payer: Self-pay | Admitting: Cardiology

## 2019-07-11 NOTE — Telephone Encounter (Signed)

## 2019-07-17 ENCOUNTER — Other Ambulatory Visit (HOSPITAL_COMMUNITY): Payer: Self-pay | Admitting: Family Medicine

## 2019-07-17 DIAGNOSIS — Z1231 Encounter for screening mammogram for malignant neoplasm of breast: Secondary | ICD-10-CM

## 2019-07-18 ENCOUNTER — Telehealth: Payer: Medicare HMO | Admitting: Cardiology

## 2019-07-18 DIAGNOSIS — R69 Illness, unspecified: Secondary | ICD-10-CM | POA: Diagnosis not present

## 2019-07-18 DIAGNOSIS — I10 Essential (primary) hypertension: Secondary | ICD-10-CM | POA: Diagnosis not present

## 2019-07-18 DIAGNOSIS — E039 Hypothyroidism, unspecified: Secondary | ICD-10-CM | POA: Diagnosis not present

## 2019-07-20 NOTE — Progress Notes (Signed)
Virtual Visit via Telephone Note   This visit type was conducted due to national recommendations for restrictions regarding the COVID-19 Pandemic (e.g. social distancing) in an effort to limit this patient's exposure and mitigate transmission in our community.  Due to her co-morbid illnesses, this patient is at least at moderate risk for complications without adequate follow up.  This format is felt to be most appropriate for this patient at this time.  The patient did not have access to video technology/had technical difficulties with video requiring transitioning to audio format only (telephone).  All issues noted in this document were discussed and addressed.  No physical exam could be performed with this format.  Please refer to the patient's chart for her  consent to telehealth for [facility].   Date:  07/21/2019   ID:  [name], DOB 28-Dec-1955, MRN [id number]  Patient Location: Home Provider Location: Office  PCP:  Rory Percy, MD  Cardiologist:  Rozann Lesches, MD Electrophysiologist:  None   Evaluation Performed:  Follow-Up Visit  Chief Complaint:   Cardiac follow-up  History of Present Illness:    Donna Howe is a 63 y.o. female last seen in December 2019.  We spoke by phone today.  She does not report any active angina at this time on medical therapy, no nitroglycerin use.  She has been under a lot of stress.  She states that she saw Dr. Nadara Mustard in the meanwhile and actually had lab work recently which we will request for review.  She states that she has been compliant with her medications, is having blood pressure tracked by [facility].  She has had difficulty remembering to take her hydralazine 3 times a day.  Cardiac testing and PCI from last year are outlined below.  I did review her lab work from August of 2019.  Current cardiac regimen includes aspirin, Norvasc, Lipitor, Plavix, hydralazine, Isordil, and as needed nitroglycerin.  The patient does not  have symptoms concerning for COVID-19 infection (fever, chills, cough, or new shortness of breath).    Past Medical History:  Diagnosis Date  . Anemia   . Asthma   . CAD (coronary artery disease)    Multivessel s/p CABG 2005, numerous PCIs since that time  . Carotid artery disease (HCC)    R CEA  . Chronic kidney disease (CKD), stage III (moderate)   . Essential hypertension   . Gout   . Hyperlipidemia   . Hypothyroidism   . Myocardial infarction ([place])   . PAD (peripheral artery disease) ([place])    Dr. Kellie Simmering  . S/P angioplasty with stent- DES to [facility] and to LIMA to LAD with DES 04/09/18.   04/10/2018  . SBO (small bowel obstruction) ([place]) 2011   Status post lysis of adhesions & hernia repair  . Sinus bradycardia   . Thrombocytopenia, unspecified ([place])   . Type 2 diabetes mellitus ([place])   . Umbilical hernia    Past Surgical History:  Procedure Laterality Date  . CESAREAN SECTION  1984  . CHOLECYSTECTOMY  2010  . CORONARY ARTERY BYPASS GRAFT  2005  . CORONARY BALLOON ANGIOPLASTY N/A 05/31/2017   Procedure: CORONARY BALLOON ANGIOPLASTY;  Surgeon: Jettie Booze, MD;  Location: [place] CV LAB;  Service: Cardiovascular;  Laterality: N/A;  . CORONARY STENT INTERVENTION N/A 05/31/2017   Procedure: CORONARY STENT INTERVENTION;  Surgeon: Jettie Booze, MD;  Location: [place] CV LAB;  Service: Cardiovascular;  Laterality: N/A;  . CORONARY STENT INTERVENTION N/A 04/09/2018  Procedure: CORONARY STENT INTERVENTION;  Surgeon: Jettie Booze, MD;  Location: [place] CV LAB;  Service: Cardiovascular;  Laterality: N/A;  SVG RCA  . ENDARTERECTOMY Right 04/18/2013   Procedure: ENDARTERECTOMY CAROTID;  Surgeon: Mal Misty, MD;  Location: [place];  Service: Vascular;  Laterality: Right;  . HERNIA REPAIR  1989  . Incisional hernia repair x2  03/04/2010   Laparoscopic with 35cm mesh by Dr Ronnald Collum  . LEFT HEART CATH AND CORS/GRAFTS ANGIOGRAPHY N/A 05/31/2017    Procedure: LEFT HEART CATH AND CORS/GRAFTS ANGIOGRAPHY;  Surgeon: Jettie Booze, MD;  Location: [place] CV LAB;  Service: Cardiovascular;  Laterality: N/A;  . LEFT HEART CATH AND CORS/GRAFTS ANGIOGRAPHY N/A 04/08/2018   Procedure: LEFT HEART CATH AND CORS/GRAFTS ANGIOGRAPHY;  Surgeon: Jettie Booze, MD;  Location: [place] CV LAB;  Service: Cardiovascular;  Laterality: N/A;  . LEFT HEART CATHETERIZATION WITH CORONARY ANGIOGRAM N/A 12/19/2012   Procedure: LEFT HEART CATHETERIZATION WITH CORONARY ANGIOGRAM;  Surgeon: Josue Hector, MD;  Location: [facility] CATH LAB;  Service: Cardiovascular;  Laterality: N/A;  . LEFT HEART CATHETERIZATION WITH CORONARY/GRAFT ANGIOGRAM N/A 04/19/2013   Procedure: LEFT HEART CATHETERIZATION WITH Beatrix Fetters;  Surgeon: Lorretta Harp, MD;  Location: [facility] CATH LAB;  Service: Cardiovascular;  Laterality: N/A;  . PATCH ANGIOPLASTY Right 04/18/2013   Procedure: PATCH ANGIOPLASTY Right Internal Carotid Artery;  Surgeon: Mal Misty, MD;  Location: [place];  Service: Vascular;  Laterality: Right;  . PERCUTANEOUS CORONARY STENT INTERVENTION (PCI-S) Right 12/19/2012   Procedure: PERCUTANEOUS CORONARY STENT INTERVENTION (PCI-S);  Surgeon: Josue Hector, MD;  Location: [facility] CATH LAB;  Service: Cardiovascular;  Laterality: Right;  . SHOULDER SURGERY       Current Meds  Medication Sig  . ALPRAZolam (XANAX) 0.5 MG tablet Take 0.25-0.5 mg by mouth See admin instructions. Take 1/2 tab by mouth every morning & evening and 1 tab at bedtime   . amLODipine (NORVASC) 10 MG tablet Take 5 mg by mouth 2 (two) times daily.  Marland Kitchen aspirin EC 81 MG tablet Take 81 mg by mouth every morning.   Marland Kitchen atorvastatin (LIPITOR) 80 MG tablet Take 1 tablet (80 mg total) by mouth daily.  . clopidogrel (PLAVIX) 75 MG tablet Take 1 tablet (75 mg total) by mouth daily.  . colchicine 0.6 MG tablet Take 0.6 mg by mouth daily.   Marland Kitchen gabapentin (NEURONTIN) 400 MG capsule Take 400 mg by mouth 2 (two)  times daily.   . hydrALAZINE (APRESOLINE) 50 MG tablet Take 75 mg by mouth 3 (three) times daily.  . isosorbide dinitrate (ISORDIL) 5 MG tablet Take 5 mg by mouth 2 (two) times daily.  Marland Kitchen levothyroxine (SYNTHROID, LEVOTHROID) 125 MCG tablet Take 125 mcg by mouth every morning.  . nitroGLYCERIN (NITROSTAT) 0.4 MG SL tablet Place 0.4 mg under the tongue every 5 (five) minutes as needed for chest pain. Not to exceed 3 in 15 minute time frame  . NOVOLIN 70/30 RELION (70-30) 100 UNIT/ML injection Inject 24 Units into the skin 2 (two) times daily with a meal.   . ondansetron (ZOFRAN ODT) 8 MG disintegrating tablet Take 1 tablet (8 mg total) by mouth every 8 (eight) hours as needed for nausea or vomiting.  . VENTOLIN HFA 108 (90 Base) MCG/ACT inhaler Inhale 2 puffs into the lungs every 6 (six) hours as needed for wheezing or shortness of breath.      Allergies:   Penicillins   Social History   Tobacco Use  .  Smoking status: Former Smoker    Packs/day: 1.00    Years: 20.00    Pack years: 20.00    Types: Cigarettes    Quit date: 12/10/2012    Years since quitting: 6.6  . Smokeless tobacco: Never Used  Substance Use Topics  . Alcohol use: No    Alcohol/week: 0.0 standard drinks  . Drug use: No     Family Hx: The patient's family history includes AAA (abdominal aortic aneurysm) in her father; Diabetes in her mother; Heart disease in her brother and mother; Hyperlipidemia in her mother and son; Hypertension in her brother, father, mother, and son; Thyroid disease in her father.  ROS:   Please see the history of present illness. All other systems reviewed and are negative.   Prior CV studies:   The following studies were reviewed today:  Coronary PCI 04/09/2018:  Non-stenotic Ost LM to LM lesion previously treated.  Ost LAD to Prox LAD lesion is 80% stenosed.  Non-stenotic Ost Cx to Mid Cx lesion previously treated.  1st Mrg-1 lesion is 70% stenosed.  Ost 1st Mrg lesion is 80%  stenosed.  1st Mrg-2 lesion is 10% stenosed.  Prox Cx to Mid Cx lesion is 50% stenosed.  Patent stent in the ostium of the SVG to the RCA  Mid RCA lesion is 90% stenosed.  A drug-eluting stent was successfully placed using a STENT SIERRA 2.50 X 15 MM, through the SVG to RCA graft.  Post intervention, there is a 0% residual stenosis.  Origin lesion is 70% stenosed of the LIMA to LAD.  A drug-eluting stent was successfully placed using a STENT SYNERGY DES 3X12, postdilated to 3.25 mm.  Post intervention, there is a 0% residual stenosis.  Recommend uninterrupted dual antiplatelet therapy with Aspirin 81mg  daily and Ticagrelor 90mg  twice dailyfor a minimum of 12 months (ACS - Class I recommendation).  Continue aggressive secondary prevention.   Cardiac catheterization 04/08/2018:  Non-stenotic Ost LM to LM lesion previously treated.  Ost LAD to Prox LAD lesion is 80% stenosed. LIMA to LAD is patent with a proximal 60% lesion.  Non-stenotic Ost Cx to Mid Cx lesion previously treated.  Prox Cx to Mid Cx lesion is 50% stenosed.  Balloon angioplasty was performed.  1st Mrg-1 lesion is 70% stenosed.  Ost 1st Mrg lesion is 80% stenosed.  1st Mrg-2 lesion is 10% stenosed.  Origin to Prox Graft lesion is 100% stenosed. SVG to OM is occluded.  Previously placed Origin drug eluting stent is widely patent.  Balloon angioplasty was performed.  Mid RCA lesion is 90% stenosed. THis is past the insertion of the graft.  LV end diastolic pressure is mildly elevated.  There is no aortic valve stenosis.  Origin lesion is 60% stenosed.  Recommend dual antiplatelet therapy with Aspirin 81mg  daily and Clopidogrel 75mg  dailylong-term (beyond 12 months) because of extensive CAD, and multiple stents..  Will look into repeat PCI attempt tomorrow if Cr is ok. Could consider redo CABG eval as well given extensive circ disease and now two lesions in the SVG to RCA in the last 12  months, proximal LIMA lesion. Will discuss with interventional colleagues.  Echocardiogram 04/05/2018: Study Conclusions  - Left ventricle: The cavity size was normal. Wall thickness was normal. Systolic function was normal. The estimated ejection fraction was in the range of 60% to 65%. Wall motion was normal; there were no regional wall motion abnormalities. Features are consistent with a pseudonormal left ventricular filling pattern, with concomitant abnormal relaxation  and increased filling pressure (grade 2 diastolic dysfunction). Doppler parameters are consistent with high ventricular filling pressure. - Aortic valve: Valve area (VTI): 1.56 cm^2. Valve area (Vmax): 1.63 cm^2. - Mitral valve: There was mild regurgitation. - Left atrium: The atrium was mildly dilated. - Technically adequate study.  Labs/Other Tests and Data Reviewed:    EKG:  An ECG dated 04/18/2018 was personally reviewed today and demonstrated:  Sinus bradycardia with probable arm lead reversal.  Recent Labs:  August 2019: Hemoglobin A1c 6.4%, cholesterol 123, triglycerides 164, HDL 31, LDL 59, BUN 32, creatinine 1.98, potassium 4.5  Wt Readings from Last 3 Encounters:  07/21/19 205 lb (93 kg)  06/09/19 205 lb (93 kg)  11/23/18 200 lb (90.7 kg)     Objective:    Vital Signs:  Ht 5\' 6"  (1.676 m)   Wt 205 lb (93 kg)   BMI 33.09 kg/m    States that she took her blood pressure with manual cuff this morning at 160/80.  She thinks that the other automated cuff from [facility] is not functioning properly and is supposed to get a new device. Patient spoke in full sentences, not short of breath. No audible wheezing or coughing. Speech pattern normal.  ASSESSMENT & PLAN:    1.  CAD status post CABG with subsequent DES intervention to the mid RCA as well as DES to the LIMA to LAD last year.  She does not report any active angina.  Plan to continue dual antiplatelet therapy which she is  tolerating.  Continue statin as well.  2.  Mixed hyperlipidemia, remains on high-dose Lipitor.  Requesting recent lab work from PCP.  Her last LDL was 59.  3.  Essential hypertension, blood pressure not optimally controlled.  I talked with her about a way to schedule her hydralazine dosing during the day.  She plans to get a new cuff from [facility].  4.  CKD stage III, last creatinine was 1.98.  She is not on ARB or ACE inhibitor.  5.  Carotid artery disease status post right CEA, previously followed by [facility].  Will discuss follow-up carotid Dopplers around next visit.  COVID-19 Education: The signs and symptoms of COVID-19 were discussed with the patient and how to seek care for testing (follow up with PCP or arrange E-visit). The importance of social distancing was discussed today.  Time:   Today, I have spent 10 minutes with the patient with telehealth technology discussing the above problems.     Medication Adjustments/Labs and Tests Ordered: Current medicines are reviewed at length with the patient today.  Concerns regarding medicines are outlined above.   Tests Ordered: No orders of the defined types were placed in this encounter.   Medication Changes: No orders of the defined types were placed in this encounter.   Follow Up:  In Person 6 months in the [place] office.  Signed, Rozann Lesches, MD  07/21/2019 9:56 AM    [place]

## 2019-07-21 ENCOUNTER — Encounter: Payer: Self-pay | Admitting: Cardiology

## 2019-07-21 ENCOUNTER — Telehealth (INDEPENDENT_AMBULATORY_CARE_PROVIDER_SITE_OTHER): Payer: Medicare HMO | Admitting: Cardiology

## 2019-07-21 ENCOUNTER — Encounter: Payer: Self-pay | Admitting: *Deleted

## 2019-07-21 VITALS — Ht 66.0 in | Wt 205.0 lb

## 2019-07-21 DIAGNOSIS — N1832 Chronic kidney disease, stage 3b: Secondary | ICD-10-CM

## 2019-07-21 DIAGNOSIS — I6523 Occlusion and stenosis of bilateral carotid arteries: Secondary | ICD-10-CM

## 2019-07-21 DIAGNOSIS — I25119 Atherosclerotic heart disease of native coronary artery with unspecified angina pectoris: Secondary | ICD-10-CM | POA: Diagnosis not present

## 2019-07-21 DIAGNOSIS — E782 Mixed hyperlipidemia: Secondary | ICD-10-CM

## 2019-07-21 DIAGNOSIS — I10 Essential (primary) hypertension: Secondary | ICD-10-CM

## 2019-07-21 NOTE — Patient Instructions (Addendum)

## 2019-07-25 ENCOUNTER — Other Ambulatory Visit: Payer: Self-pay

## 2019-07-25 ENCOUNTER — Ambulatory Visit (HOSPITAL_COMMUNITY)
Admission: RE | Admit: 2019-07-25 | Discharge: 2019-07-25 | Disposition: A | Discharge status: Home / Self Care | Payer: Medicare HMO | Source: Ambulatory Visit | Attending: Family Medicine | Admitting: Family Medicine

## 2019-07-25 DIAGNOSIS — Z1231 Encounter for screening mammogram for malignant neoplasm of breast: Secondary | ICD-10-CM | POA: Diagnosis not present

## 2019-08-18 DIAGNOSIS — I10 Essential (primary) hypertension: Secondary | ICD-10-CM | POA: Diagnosis not present

## 2019-08-23 DIAGNOSIS — E1142 Type 2 diabetes mellitus with diabetic polyneuropathy: Secondary | ICD-10-CM | POA: Diagnosis not present

## 2019-08-23 DIAGNOSIS — E669 Obesity, unspecified: Secondary | ICD-10-CM | POA: Diagnosis not present

## 2019-08-23 DIAGNOSIS — I25119 Atherosclerotic heart disease of native coronary artery with unspecified angina pectoris: Secondary | ICD-10-CM | POA: Diagnosis not present

## 2019-08-23 DIAGNOSIS — E1151 Type 2 diabetes mellitus with diabetic peripheral angiopathy without gangrene: Secondary | ICD-10-CM | POA: Diagnosis not present

## 2019-08-23 DIAGNOSIS — R69 Illness, unspecified: Secondary | ICD-10-CM | POA: Diagnosis not present

## 2019-08-23 DIAGNOSIS — E1165 Type 2 diabetes mellitus with hyperglycemia: Secondary | ICD-10-CM | POA: Diagnosis not present

## 2019-08-23 DIAGNOSIS — J42 Unspecified chronic bronchitis: Secondary | ICD-10-CM | POA: Diagnosis not present

## 2019-08-23 DIAGNOSIS — E785 Hyperlipidemia, unspecified: Secondary | ICD-10-CM | POA: Diagnosis not present

## 2019-08-23 DIAGNOSIS — E039 Hypothyroidism, unspecified: Secondary | ICD-10-CM | POA: Diagnosis not present

## 2019-08-23 DIAGNOSIS — Z794 Long term (current) use of insulin: Secondary | ICD-10-CM | POA: Diagnosis not present

## 2019-08-27 DIAGNOSIS — R69 Illness, unspecified: Secondary | ICD-10-CM | POA: Diagnosis not present

## 2019-09-18 DIAGNOSIS — I10 Essential (primary) hypertension: Secondary | ICD-10-CM | POA: Diagnosis not present

## 2019-09-18 DIAGNOSIS — E782 Mixed hyperlipidemia: Secondary | ICD-10-CM | POA: Diagnosis not present

## 2019-09-24 DIAGNOSIS — R69 Illness, unspecified: Secondary | ICD-10-CM | POA: Diagnosis not present

## 2019-09-29 DIAGNOSIS — Z20822 Contact with and (suspected) exposure to covid-19: Secondary | ICD-10-CM | POA: Diagnosis not present

## 2019-09-29 DIAGNOSIS — J209 Acute bronchitis, unspecified: Secondary | ICD-10-CM | POA: Diagnosis not present

## 2019-10-01 ENCOUNTER — Inpatient Hospital Stay (HOSPITAL_COMMUNITY)
Admission: EM | Admit: 2019-10-01 | Discharge: 2019-10-03 | DRG: 202 | Disposition: A | Discharge status: Home / Self Care | Payer: MEDICARE | Attending: Internal Medicine | Admitting: Internal Medicine

## 2019-10-01 ENCOUNTER — Other Ambulatory Visit: Payer: Self-pay

## 2019-10-01 ENCOUNTER — Emergency Department (HOSPITAL_COMMUNITY): Payer: MEDICARE

## 2019-10-01 ENCOUNTER — Encounter (HOSPITAL_COMMUNITY): Payer: Self-pay

## 2019-10-01 DIAGNOSIS — Z833 Family history of diabetes mellitus: Secondary | ICD-10-CM

## 2019-10-01 DIAGNOSIS — R079 Chest pain, unspecified: Secondary | ICD-10-CM | POA: Diagnosis not present

## 2019-10-01 DIAGNOSIS — N184 Chronic kidney disease, stage 4 (severe): Secondary | ICD-10-CM | POA: Diagnosis present

## 2019-10-01 DIAGNOSIS — I2510 Atherosclerotic heart disease of native coronary artery without angina pectoris: Secondary | ICD-10-CM | POA: Diagnosis present

## 2019-10-01 DIAGNOSIS — Z955 Presence of coronary angioplasty implant and graft: Secondary | ICD-10-CM

## 2019-10-01 DIAGNOSIS — Z87891 Personal history of nicotine dependence: Secondary | ICD-10-CM

## 2019-10-01 DIAGNOSIS — I10 Essential (primary) hypertension: Secondary | ICD-10-CM | POA: Diagnosis present

## 2019-10-01 DIAGNOSIS — Z880 Allergy status to penicillin: Secondary | ICD-10-CM

## 2019-10-01 DIAGNOSIS — Z7902 Long term (current) use of antithrombotics/antiplatelets: Secondary | ICD-10-CM

## 2019-10-01 DIAGNOSIS — E039 Hypothyroidism, unspecified: Secondary | ICD-10-CM | POA: Diagnosis present

## 2019-10-01 DIAGNOSIS — N179 Acute kidney failure, unspecified: Secondary | ICD-10-CM | POA: Diagnosis not present

## 2019-10-01 DIAGNOSIS — I200 Unstable angina: Secondary | ICD-10-CM | POA: Diagnosis not present

## 2019-10-01 DIAGNOSIS — J45901 Unspecified asthma with (acute) exacerbation: Secondary | ICD-10-CM | POA: Diagnosis present

## 2019-10-01 DIAGNOSIS — I25110 Atherosclerotic heart disease of native coronary artery with unstable angina pectoris: Secondary | ICD-10-CM | POA: Diagnosis not present

## 2019-10-01 DIAGNOSIS — Z951 Presence of aortocoronary bypass graft: Secondary | ICD-10-CM | POA: Diagnosis not present

## 2019-10-01 DIAGNOSIS — I252 Old myocardial infarction: Secondary | ICD-10-CM | POA: Diagnosis not present

## 2019-10-01 DIAGNOSIS — E1122 Type 2 diabetes mellitus with diabetic chronic kidney disease: Secondary | ICD-10-CM

## 2019-10-01 DIAGNOSIS — R05 Cough: Secondary | ICD-10-CM | POA: Diagnosis not present

## 2019-10-01 DIAGNOSIS — Z79890 Hormone replacement therapy (postmenopausal): Secondary | ICD-10-CM | POA: Diagnosis not present

## 2019-10-01 DIAGNOSIS — Z8249 Family history of ischemic heart disease and other diseases of the circulatory system: Secondary | ICD-10-CM

## 2019-10-01 DIAGNOSIS — I9589 Other hypotension: Secondary | ICD-10-CM | POA: Diagnosis present

## 2019-10-01 DIAGNOSIS — E785 Hyperlipidemia, unspecified: Secondary | ICD-10-CM | POA: Diagnosis not present

## 2019-10-01 DIAGNOSIS — Z6833 Body mass index (BMI) 33.0-33.9, adult: Secondary | ICD-10-CM

## 2019-10-01 DIAGNOSIS — R778 Other specified abnormalities of plasma proteins: Secondary | ICD-10-CM | POA: Diagnosis present

## 2019-10-01 DIAGNOSIS — E1165 Type 2 diabetes mellitus with hyperglycemia: Secondary | ICD-10-CM | POA: Diagnosis not present

## 2019-10-01 DIAGNOSIS — N183 Chronic kidney disease, stage 3 unspecified: Secondary | ICD-10-CM | POA: Diagnosis present

## 2019-10-01 DIAGNOSIS — Z8349 Family history of other endocrine, nutritional and metabolic diseases: Secondary | ICD-10-CM | POA: Diagnosis not present

## 2019-10-01 DIAGNOSIS — J45909 Unspecified asthma, uncomplicated: Secondary | ICD-10-CM | POA: Diagnosis present

## 2019-10-01 DIAGNOSIS — E669 Obesity, unspecified: Secondary | ICD-10-CM | POA: Diagnosis present

## 2019-10-01 DIAGNOSIS — Z7982 Long term (current) use of aspirin: Secondary | ICD-10-CM

## 2019-10-01 DIAGNOSIS — E1151 Type 2 diabetes mellitus with diabetic peripheral angiopathy without gangrene: Secondary | ICD-10-CM | POA: Diagnosis present

## 2019-10-01 DIAGNOSIS — E1129 Type 2 diabetes mellitus with other diabetic kidney complication: Secondary | ICD-10-CM | POA: Diagnosis present

## 2019-10-01 DIAGNOSIS — Z20822 Contact with and (suspected) exposure to covid-19: Secondary | ICD-10-CM | POA: Diagnosis not present

## 2019-10-01 DIAGNOSIS — R7989 Other specified abnormal findings of blood chemistry: Secondary | ICD-10-CM | POA: Diagnosis present

## 2019-10-01 DIAGNOSIS — Z794 Long term (current) use of insulin: Secondary | ICD-10-CM

## 2019-10-01 DIAGNOSIS — R0789 Other chest pain: Secondary | ICD-10-CM | POA: Diagnosis not present

## 2019-10-01 DIAGNOSIS — M109 Gout, unspecified: Secondary | ICD-10-CM | POA: Diagnosis not present

## 2019-10-01 DIAGNOSIS — J40 Bronchitis, not specified as acute or chronic: Principal | ICD-10-CM | POA: Diagnosis present

## 2019-10-01 DIAGNOSIS — J80 Acute respiratory distress syndrome: Secondary | ICD-10-CM | POA: Diagnosis not present

## 2019-10-01 DIAGNOSIS — R0689 Other abnormalities of breathing: Secondary | ICD-10-CM | POA: Diagnosis not present

## 2019-10-01 DIAGNOSIS — I129 Hypertensive chronic kidney disease with stage 1 through stage 4 chronic kidney disease, or unspecified chronic kidney disease: Secondary | ICD-10-CM | POA: Diagnosis present

## 2019-10-01 LAB — URINALYSIS, ROUTINE W REFLEX MICROSCOPIC
Bacteria, UA: NONE SEEN
Bilirubin Urine: NEGATIVE
Glucose, UA: >=500 mg/dL — AB
Hgb urine dipstick: NEGATIVE
Ketones, ur: NEGATIVE mg/dL
Leukocytes,Ua: NEGATIVE
Nitrite: NEGATIVE
Protein, ur: >=300 mg/dL — AB
Specific Gravity, Urine: 1.013 (ref 1.005–1.030)
pH: 5 (ref 5.0–8.0)

## 2019-10-01 LAB — CBC WITH DIFFERENTIAL/PLATELET
Abs Immature Granulocytes: 0.47 10*3/uL — ABNORMAL HIGH (ref 0.00–0.07)
Basophils Absolute: 0.1 10*3/uL (ref 0.0–0.1)
Basophils Relative: 0 %
Eosinophils Absolute: 0 10*3/uL (ref 0.0–0.5)
Eosinophils Relative: 0 %
HCT: 39.5 % (ref 36.0–46.0)
Hemoglobin: 12.6 g/dL (ref 12.0–15.0)
Immature Granulocytes: 3 %
Lymphocytes Relative: 5 %
Lymphs Abs: 0.7 10*3/uL (ref 0.7–4.0)
MCH: 29.4 pg (ref 26.0–34.0)
MCHC: 31.9 g/dL (ref 30.0–36.0)
MCV: 92.3 fL (ref 80.0–100.0)
Monocytes Absolute: 0.4 10*3/uL (ref 0.1–1.0)
Monocytes Relative: 3 %
Neutro Abs: 12.1 10*3/uL — ABNORMAL HIGH (ref 1.7–7.7)
Neutrophils Relative %: 89 %
Platelets: 198 10*3/uL (ref 150–400)
RBC: 4.28 MIL/uL (ref 3.87–5.11)
RDW: 14 % (ref 11.5–15.5)
WBC: 13.7 10*3/uL — ABNORMAL HIGH (ref 4.0–10.5)
nRBC: 0 % (ref 0.0–0.2)

## 2019-10-01 LAB — COMPREHENSIVE METABOLIC PANEL
ALT: 32 U/L (ref 0–44)
AST: 24 U/L (ref 15–41)
Albumin: 3.5 g/dL (ref 3.5–5.0)
Alkaline Phosphatase: 170 U/L — ABNORMAL HIGH (ref 38–126)
Anion gap: 7 (ref 5–15)
BUN: 62 mg/dL — ABNORMAL HIGH (ref 8–23)
CO2: 20 mmol/L — ABNORMAL LOW (ref 22–32)
Calcium: 8.6 mg/dL — ABNORMAL LOW (ref 8.9–10.3)
Chloride: 105 mmol/L (ref 98–111)
Creatinine, Ser: 3.02 mg/dL — ABNORMAL HIGH (ref 0.44–1.00)
GFR calc Af Amer: 19 mL/min — ABNORMAL LOW (ref >60)
GFR calc non Af Amer: 16 mL/min — ABNORMAL LOW (ref >60)
Glucose, Bld: 316 mg/dL — ABNORMAL HIGH (ref 70–99)
Potassium: 5.1 mmol/L (ref 3.5–5.1)
Sodium: 132 mmol/L — ABNORMAL LOW (ref 135–145)
Total Bilirubin: 0.4 mg/dL (ref 0.3–1.2)
Total Protein: 7.2 g/dL (ref 6.5–8.1)

## 2019-10-01 LAB — TROPONIN I (HIGH SENSITIVITY): Troponin I (High Sensitivity): 16 ng/L (ref <18)

## 2019-10-01 LAB — RESPIRATORY PANEL BY RT PCR (FLU A&B, COVID)
Influenza A by PCR: NEGATIVE
Influenza B by PCR: NEGATIVE
SARS Coronavirus 2 by RT PCR: NEGATIVE

## 2019-10-01 LAB — CBG MONITORING, ED
Glucose-Capillary: 291 mg/dL — ABNORMAL HIGH (ref 70–99)
Glucose-Capillary: 222 mg/dL — ABNORMAL HIGH (ref 70–99)

## 2019-10-01 MED ORDER — INSULIN ASPART 100 UNIT/ML ~~LOC~~ SOLN
0.0000 [IU] | SUBCUTANEOUS | Status: DC
  Administered 2019-10-01: 3 [IU] via SUBCUTANEOUS
  Administered 2019-10-02: 1 [IU] via SUBCUTANEOUS
  Administered 2019-10-02: 2 [IU] via SUBCUTANEOUS
  Administered 2019-10-02: 3 [IU] via SUBCUTANEOUS
  Filled 2019-10-01 (×4): qty 1

## 2019-10-01 MED ORDER — NITROGLYCERIN 0.4 MG SL SUBL
0.4000 mg | SUBLINGUAL_TABLET | Freq: Once | SUBLINGUAL | Status: AC
  Administered 2019-10-01: 0.4 mg via SUBLINGUAL
  Filled 2019-10-01: qty 1

## 2019-10-01 MED ORDER — SODIUM CHLORIDE 0.9 % IV SOLN: INTRAVENOUS | Status: AC

## 2019-10-01 MED ORDER — ACETAMINOPHEN 650 MG RE SUPP: 650.0000 mg | Freq: Four times a day (QID) | RECTAL | Status: DC | PRN

## 2019-10-01 MED ORDER — ACETAMINOPHEN 325 MG PO TABS: 650.0000 mg | ORAL_TABLET | Freq: Four times a day (QID) | ORAL | Status: DC | PRN

## 2019-10-01 MED ORDER — ENOXAPARIN SODIUM 30 MG/0.3ML ~~LOC~~ SOLN
30.0000 mg | SUBCUTANEOUS | Status: DC
  Administered 2019-10-01: 30 mg via SUBCUTANEOUS
  Filled 2019-10-01: qty 0.3

## 2019-10-01 NOTE — ED Triage Notes (Signed)
Pt reports chest pain and shortness of breath that started last night, pt called EMS tonight for increasing CP, pt took nitro x 2 at home with no relief after, then called EMS, pt reports having being diagnosed with bronchitis. EMS reports EKG (both anterior and posterior were unremarkable.) Vitals per EMS: CBG-268  BP- 198/80, 96% on room air and CO2-33(pt took 30 units insulin at 1900 with previous reading at home of 427)pt able to speak full sentences and EKG given Dr Sabra Heck, who is at bedside.

## 2019-10-01 NOTE — ED Provider Notes (Addendum)
[facility] EMERGENCY DEPARTMENT Provider Note   CSN: [id number] Arrival date & time: 10/01/19  2035     History Chief Complaint  Patient presents with  . Chest Pain    Donna Howe is a 64 y.o. female.  HPI   This patient is a 64 year old female, she has a known history of coronary disease status post bypass grafting in 2005 as well as multiple stents.  She has chronic kidney disease, hypertension and is a diabetic.  She was recently tested for COVID-19 after being symptomatic with a cough and a significant other, grandchild, who had recently been diagnosed with Covid but she was negative.  She was placed on prednisone and has had some hyperglycemia.  She states that yesterday evening she started to have some chest pain in the middle of her chest like a heaviness or a sitting feeling on her chest with some radiation into the left arm and down the arm.  She was short of breath but has noticed that she has been coughing as well.  The pain seems to be intermittent, fluctuating, she does not exert herself, she does not exercise very much, over the last 24 hours has had no exercise.  She denies having any fevers, phlegm, she has chronic mild swelling of the legs.  She states this reports that she feels this is similar to prior coronary disease in the past.  She comes in this evening because of the ongoing and prolonged episodes of chest pain this evening  Past Medical History:  Diagnosis Date  . Anemia   . Asthma   . CAD (coronary artery disease)    Multivessel s/p CABG 2005, numerous PCIs since that time  . Carotid artery disease (HCC)    R CEA  . Chronic kidney disease (CKD), stage III (moderate)   . Essential hypertension   . Gout   . Hyperlipidemia   . Hypothyroidism   . Myocardial infarction ([place])   . PAD (peripheral artery disease) ([place])    Dr. Kellie Simmering  . S/P angioplasty with stent- DES to [facility] and to [facility] to LAD with DES 04/09/18.   04/10/2018  . SBO (small bowel  obstruction) ([place]) 2011   Status post lysis of adhesions & hernia repair  . Sinus bradycardia   . Thrombocytopenia, unspecified ([place])   . Type 2 diabetes mellitus ([place])   . Umbilical hernia     Patient Active Problem List   Diagnosis Date Noted  . Unstable angina ([place]) 04/18/2018  . Hypothyroidism 04/18/2018  . S/P angioplasty with stent- DES to [facility] and to [facility] to LAD with DES 04/09/18.   04/10/2018  . Angina pectoris ([place]) 04/05/2018  . Status post coronary artery stent placement   . ACS (acute coronary syndrome) ([place]) 05/31/2017  . Acute chest pain   . Non-ST elevation (NSTEMI) myocardial infarction ([place])   . History of coronary artery disease   . Diabetes ([place]) 10/28/2013  . Hypoglycemia associated with diabetes ([place]) 10/28/2013  . Thrombocytopenia, unspecified ([place]) 10/28/2013  . Hypokalemia 10/27/2013  . Syncope 10/26/2013  . Anemia 10/26/2013  . Renal failure, acute on chronic (HCC) 10/26/2013  . Fracture of toe of right foot 10/26/2013  . Umbilical hernia   . Carotid artery disease ([place])   . Occlusion and stenosis of carotid artery without mention of cerebral infarction 04/15/2013  . Hx of CABG   . Ejection fraction   . Atherosclerosis of native arteries of the extremities with intermittent claudication 02/11/2013  . Diabetes  mellitus with renal manifestation ([place]) 01/21/2013  . Bradycardia 01/03/2013  . Chronic kidney disease (CKD), stage III (moderate) (HCC)   . Gout   . PROTEINURIA, MILD 01/18/2010  . TOBACCO ABUSE 08/27/2009  . Obesity (BMI 30-39.9) 08/26/2009  . Asthma 08/26/2009  . Hyperlipidemia 03/31/2007  . Essential hypertension 03/31/2007  . CAD (coronary artery disease) 03/31/2007    Past Surgical History:  Procedure Laterality Date  . CESAREAN SECTION  1984  . CHOLECYSTECTOMY  2010  . CORONARY ARTERY BYPASS GRAFT  2005  . CORONARY BALLOON ANGIOPLASTY N/A 05/31/2017   Procedure: CORONARY BALLOON ANGIOPLASTY;  Surgeon: Jettie Booze, MD;   Location: [place] CV LAB;  Service: Cardiovascular;  Laterality: N/A;  . CORONARY STENT INTERVENTION N/A 05/31/2017   Procedure: CORONARY STENT INTERVENTION;  Surgeon: Jettie Booze, MD;  Location: [place] CV LAB;  Service: Cardiovascular;  Laterality: N/A;  . CORONARY STENT INTERVENTION N/A 04/09/2018   Procedure: CORONARY STENT INTERVENTION;  Surgeon: Jettie Booze, MD;  Location: [place] CV LAB;  Service: Cardiovascular;  Laterality: N/A;  SVG RCA  . ENDARTERECTOMY Right 04/18/2013   Procedure: ENDARTERECTOMY CAROTID;  Surgeon: Mal Misty, MD;  Location: [place];  Service: Vascular;  Laterality: Right;  . HERNIA REPAIR  1989  . Incisional hernia repair x2  03/04/2010   Laparoscopic with 35cm mesh by Dr Ronnald Collum  . LEFT HEART CATH AND CORS/GRAFTS ANGIOGRAPHY N/A 05/31/2017   Procedure: LEFT HEART CATH AND CORS/GRAFTS ANGIOGRAPHY;  Surgeon: Jettie Booze, MD;  Location: [place] [facility] LAB;  Service: Cardiovascular;  Laterality: N/A;  . LEFT HEART CATH AND CORS/GRAFTS ANGIOGRAPHY N/A 04/08/2018   Procedure: LEFT HEART CATH AND CORS/GRAFTS ANGIOGRAPHY;  Surgeon: Jettie Booze, MD;  Location: [place] [facility];  Service: Cardiovascular;  Laterality: N/A;  . LEFT HEART CATHETERIZATION WITH CORONARY ANGIOGRAM N/A 12/19/2012   Procedure: LEFT HEART CATHETERIZATION WITH CORONARY ANGIOGRAM;  Surgeon: Josue Hector, MD;  Location: [facility] CATH LAB;  Service: Cardiovascular;  Laterality: N/A;  . LEFT HEART CATHETERIZATION WITH CORONARY/GRAFT ANGIOGRAM N/A 04/19/2013   Procedure: LEFT HEART CATHETERIZATION WITH Beatrix Fetters;  Surgeon: Lorretta Harp, MD;  Location: [facility] CATH LAB;  Service: Cardiovascular;  Laterality: N/A;  . PATCH ANGIOPLASTY Right 04/18/2013   Procedure: PATCH ANGIOPLASTY Right Internal Carotid Artery;  Surgeon: Mal Misty, MD;  Location: [place];  Service: Vascular;  Laterality: Right;  . PERCUTANEOUS CORONARY STENT INTERVENTION (PCI-S) Right  12/19/2012   Procedure: PERCUTANEOUS CORONARY STENT INTERVENTION (PCI-S);  Surgeon: Josue Hector, MD;  Location: [facility] CATH LAB;  Service: Cardiovascular;  Laterality: Right;  . SHOULDER SURGERY       OB History   No obstetric history on file.     Family History  Problem Relation Age of Onset  . Diabetes Mother   . Heart disease Mother        before age 25  . Hyperlipidemia Mother   . Hypertension Mother   . Thyroid disease Father   . Hypertension Father   . AAA (abdominal aortic aneurysm) Father   . Heart disease Brother        before age 74  . Hypertension Brother   . Hyperlipidemia Son   . Hypertension Son     Social History   Tobacco Use  . Smoking status: Former Smoker    Packs/day: 1.00    Years: 20.00    Pack years: 20.00    Types: Cigarettes    Quit date:  12/10/2012    Years since quitting: 6.8  . Smokeless tobacco: Never Used  Substance Use Topics  . Alcohol use: No    Alcohol/week: 0.0 standard drinks  . Drug use: No    Home Medications Prior to Admission medications   Medication Sig Start Date End Date Taking? Authorizing Provider  ALPRAZolam Duanne Moron) 0.5 MG tablet Take 0.25-0.5 mg by mouth See admin instructions. Take 1/2 tab by mouth every morning & evening and 1 tab at bedtime     [provider]  amLODipine (NORVASC) 10 MG tablet Take 5 mg by mouth 2 (two) times daily.    [provider]  aspirin EC 81 MG tablet Take 81 mg by mouth every morning.     [provider]  atorvastatin (LIPITOR) 80 MG tablet Take 1 tablet (80 mg total) by mouth daily. 06/01/17   Johnson, Clanford L, MD  clopidogrel (PLAVIX) 75 MG tablet Take 1 tablet (75 mg total) by mouth daily. 08/27/18   Satira Sark, MD  colchicine 0.6 MG tablet Take 0.6 mg by mouth daily.     [provider]  gabapentin (NEURONTIN) 400 MG capsule Take 400 mg by mouth 2 (two) times daily.     Rory Percy, MD  hydrALAZINE (APRESOLINE) 50 MG tablet Take 75 mg by  mouth 3 (three) times daily. 06/10/19   [provider]  isosorbide dinitrate (ISORDIL) 5 MG tablet Take 5 mg by mouth 2 (two) times daily.    [provider]  levothyroxine (SYNTHROID, LEVOTHROID) 125 MCG tablet Take 125 mcg by mouth every morning.    [provider]  nitroGLYCERIN (NITROSTAT) 0.4 MG SL tablet Place 0.4 mg under the tongue every 5 (five) minutes as needed for chest pain. Not to exceed 3 in 15 minute time frame    [provider]  NOVOLIN 70/30 RELION (70-30) 100 UNIT/ML injection Inject 24 Units into the skin 2 (two) times daily with a meal.  08/28/15   [provider]  ondansetron (ZOFRAN ODT) 8 MG disintegrating tablet Take 1 tablet (8 mg total) by mouth every 8 (eight) hours as needed for nausea or vomiting. 08/14/15   Evalee Jefferson, PA-C  VENTOLIN HFA 108 (90 Base) MCG/ACT inhaler Inhale 2 puffs into the lungs every 6 (six) hours as needed for wheezing or shortness of breath.  04/13/18   [provider]    Allergies    Penicillins  Review of Systems   Review of Systems  All other systems reviewed and are negative.   Physical Exam Updated Vital Signs BP (!) 166/66 (BP Location: Right Arm)   Pulse 86   Temp 98.4 F (36.9 C) (Oral)   Resp 19   SpO2 97%   Physical Exam Vitals and nursing note reviewed.  Constitutional:      General: She is not in acute distress.    Appearance: She is well-developed. She is not ill-appearing.  HENT:     Head: Normocephalic and atraumatic.     Mouth/Throat:     Pharynx: No oropharyngeal exudate.  Eyes:     General: No scleral icterus.       Right eye: No discharge.        Left eye: No discharge.     Conjunctiva/sclera: Conjunctivae normal.     Pupils: Pupils are equal, round, and reactive to light.  Neck:     Thyroid: No thyromegaly.     Vascular: No JVD.  Cardiovascular:     Rate and  Rhythm: Normal rate and regular rhythm.     Heart sounds: Murmur present. Systolic murmur  present with a grade of 3/6. No friction rub. No gallop.   Pulmonary:     Effort: Pulmonary effort is normal. No respiratory distress.     Breath sounds: Normal breath sounds. No wheezing or rales.  Abdominal:     General: Bowel sounds are normal. There is no distension.     Palpations: Abdomen is soft. There is no mass.     Tenderness: There is no abdominal tenderness.  Musculoskeletal:        General: No tenderness. Normal range of motion.     Cervical back: Normal range of motion and neck supple.     Right lower leg: Edema present.     Left lower leg: Edema present.  Lymphadenopathy:     Cervical: No cervical adenopathy.  Skin:    General: Skin is warm and dry.     Findings: No erythema or rash.  Neurological:     Mental Status: She is alert.     Coordination: Coordination normal.  Psychiatric:        Behavior: Behavior normal.     ED Results / Procedures / Treatments   Labs (all labs ordered are listed, but only abnormal results are displayed) Labs Reviewed  CBG MONITORING, ED - Abnormal; Notable for the following components:      Result Value   Glucose-Capillary 291 (*)    All other components within normal limits  RESPIRATORY PANEL BY RT PCR (FLU A&B, COVID)  CBC WITH DIFFERENTIAL/PLATELET  COMPREHENSIVE METABOLIC PANEL  TROPONIN I (HIGH SENSITIVITY)    EKG EKG Interpretation  Date/Time:  Wednesday October 01 2019 20:47:20 EST Ventricular Rate:  82 PR Interval:    QRS Duration: 102 QT Interval:  400 QTC Calculation: 468 R Axis:   75 Text Interpretation: Sinus rhythm Nonspecific repol abnormality, lateral leads Since last tracing rate faster Confirmed by Noemi Chapel [phone]) on 10/01/2019 8:59:07 PM   Radiology DG Chest Port 1 View  Result Date: 10/01/2019 CLINICAL DATA:  Cough and chest pain EXAM: PORTABLE CHEST 1 VIEW COMPARISON:  04/18/2018 FINDINGS: Post sternotomy changes. Mild cardiomegaly. No focal airspace disease, pleural effusion or pneumothorax.  IMPRESSION: No active disease.  Mild cardiomegaly Electronically Signed   By: Donavan Foil M.D.   On: 10/01/2019 21:23    Procedures .Critical Care Performed by: Noemi Chapel, MD Authorized by: Noemi Chapel, MD   Critical care provider statement:    Critical care time (minutes):  35   Critical care time was exclusive of:  Separately billable procedures and treating other patients and teaching time   Critical care was time spent personally by me on the following activities:  Blood draw for specimens, development of treatment plan with patient or surrogate, discussions with consultants, evaluation of patient's response to treatment, examination of patient, obtaining history from patient or surrogate, ordering and performing treatments and interventions, ordering and review of laboratory studies, ordering and review of radiographic studies, pulse oximetry, re-evaluation of patient's condition and review of old charts   (including critical care time)  Medications Ordered in ED Medications  nitroGLYCERIN (NITROSTAT) SL tablet 0.4 mg (0.4 mg Sublingual Given 10/01/19 2123)    ED Course  I have reviewed the triage vital signs and the nursing notes.  Pertinent labs & imaging results that were available during my care of the patient were reviewed by me and considered in my medical decision making (see chart for  details).    MDM Rules/Calculators/A&P                       The patient's EKG is faster than prior however with a rate of 82 of no concern.  There is no ST elevation, no T wave inversions just some nonspecific ST segments were seen on prior EKGs as well.  We will repeat a chest x-ray as well as obtain coronavirus testing to make sure there was not a false negative.  I am concerned that her persistent chest discomfort may be related to a blockage as she does have a history significant for coronary disease and is very high risk for recurrence.  She does take a baby aspirin daily,  received 325 mg prior to arrival and takes her daily Plavix.  Her glucose is down to 290 after she took some insulin this evening.  I anticipate the patient will be admitted at the minimum for cycling of her enzymes, nitroglycerin sublingual has been given.  She did have relief with this earlier in the day  The patients labs are concerning int hat she has a worsening Cr compared to prior - her Cr is 3.0 and was previously 2.1, and BUN is trending upwards.  As a diabetic with uncontrolled sugars, this can be managaed inpatient.  Additionally, the patient has ongoing CP - neg trop but at 16, needs to be trended.  The pt may have unstable angina.  Will admit - Dr. Maudie Mercury has been gracious enough to admit.  Covid negative  Donna Howe was evaluated in Emergency Department on 10/01/2019 for the symptoms described in the history of present illness. She was evaluated in the context of the global COVID-19 pandemic, which necessitated consideration that the patient might be at risk for infection with the SARS-CoV-2 virus that causes COVID-19. Institutional protocols and algorithms that pertain to the evaluation of patients at risk for COVID-19 are in a state of rapid change based on information released by regulatory bodies including the CDC and federal and state organizations. These policies and algorithms were followed during the patient's care in the ED.   Final Clinical Impression(s) / ED Diagnoses Final diagnoses:  Acute kidney injury ([place])  Chest pain, unspecified type  Unstable angina (HCC)      Noemi Chapel, MD 10/01/19 2210    Noemi Chapel, MD 10/01/19 2217

## 2019-10-01 NOTE — H&P (Addendum)
TRH H&P    Patient Demographics:    Donna Howe, is a 64 y.o. female  MRN: [id number]  DOB - [birth year]  Admit Date - 10/01/2019  Referring MD/NP/PA: Noemi Chapel   Outpatient Primary MD for the patient is Rory Percy, MD  Patient coming from:  home  Chief complaint- chest pain    HPI:    Donna Howe  is a 64 y.o. female,  w hypertension, hyperlipidemia, CKD stage 4, PAD, CAD s/p CABG,  s/p DES to RCA graft, s/p DES [place]-LAD 04/09/2018,  Presents with complaints of chest pain " substerrnal pressure" starting yesterday for which she took 3 slg nitro.  Pt notes that similar pain occurred today, and she took another 2 slg nitro without relief.  Pt states the most recent pain today started about 7pm at rest.  Pt notes radiation to the left arm.  Pt thinks related to her bronchitis which started Monday but was concerned due to the radiation to the left arm.  Pt notes slight diaphoresis "hot feeling" and slight nausea.  Pt denies fever, chills, palp, emesis, abd pain, diarrhea, brbpr, black stool.   In ED,  T 98.4, P 86 R 19, Bp 166/66 pox 97% on RA WT 93kg  CXR IMPRESSION: No active disease.  Mild cardiomegaly  EKG nsr at 80, nl axis, nl int, slight st depression in v5,6  Wbc 13.7, Hgb 12.6, Plt 198 Na 132, K 5.1, Bun 62, creatinine 3.02 Ast 24, Alt 32, Alk phos 170, T. Bili 0.4 Glucose 316 Urinalysis prot >300, rbc 0-5, wbc 0-5  Trop 16  covid pcr negative  Pt will be admitted for chest pain       Review of systems:    In addition to the HPI above,  No Fever-chills, No Headache, No changes with Vision or hearing, No problems swallowing food or Liquids, No  Cough or Shortness of Breath, No Abdominal pain, No Nausea or Vomiting, bowel movements are regular, No Blood in stool or Urine, No dysuria, No new skin rashes or bruises, No new joints pains-aches,  No new weakness,  tingling, numbness in any extremity, No recent weight gain or loss, No polyuria, polydypsia or polyphagia, No significant Mental Stressors.  All other systems reviewed and are negative.    Past History of the following :    Past Medical History:  Diagnosis Date  . Anemia   . Asthma   . CAD (coronary artery disease)    Multivessel s/p CABG 2005, numerous PCIs since that time  . Carotid artery disease (HCC)    R CEA  . Chronic kidney disease (CKD), stage III (moderate)   . Essential hypertension   . Gout   . Hyperlipidemia   . Hypothyroidism   . Myocardial infarction ([place])   . PAD (peripheral artery disease) ([place])    Dr. Kellie Simmering  . S/P angioplasty with stent- DES to [facility] and to [facility] to LAD with DES 04/09/18.   04/10/2018  . SBO (small bowel obstruction) ([place]) 2011   Status post lysis of  adhesions & hernia repair  . Sinus bradycardia   . Thrombocytopenia, unspecified ([place])   . Type 2 diabetes mellitus ([place])   . Umbilical hernia       Past Surgical History:  Procedure Laterality Date  . CESAREAN SECTION  1984  . CHOLECYSTECTOMY  2010  . CORONARY ARTERY BYPASS GRAFT  2005  . CORONARY BALLOON ANGIOPLASTY N/A 05/31/2017   Procedure: CORONARY BALLOON ANGIOPLASTY;  Surgeon: Jettie Booze, MD;  Location: [place] [facility];  Service: Cardiovascular;  Laterality: N/A;  . CORONARY STENT INTERVENTION N/A 05/31/2017   Procedure: CORONARY STENT INTERVENTION;  Surgeon: Jettie Booze, MD;  Location: [place] [facility];  Service: Cardiovascular;  Laterality: N/A;  . CORONARY STENT INTERVENTION N/A 04/09/2018   Procedure: CORONARY STENT INTERVENTION;  Surgeon: Jettie Booze, MD;  Location: [place] [facility];  Service: Cardiovascular;  Laterality: N/A;  SVG RCA  . ENDARTERECTOMY Right 04/18/2013   Procedure: ENDARTERECTOMY CAROTID;  Surgeon: Mal Misty, MD;  Location: [place];  Service: Vascular;  Laterality: Right;  . HERNIA REPAIR  1989  . Incisional hernia repair x2   03/04/2010   Laparoscopic with 35cm mesh by Dr Ronnald Collum  . LEFT HEART CATH AND CORS/GRAFTS ANGIOGRAPHY N/A 05/31/2017   Procedure: LEFT HEART CATH AND CORS/GRAFTS ANGIOGRAPHY;  Surgeon: Jettie Booze, MD;  Location: [place] [facility];  Service: Cardiovascular;  Laterality: N/A;  . LEFT HEART CATH AND CORS/GRAFTS ANGIOGRAPHY N/A 04/08/2018   Procedure: LEFT HEART CATH AND CORS/GRAFTS ANGIOGRAPHY;  Surgeon: Jettie Booze, MD;  Location: [place] [facility];  Service: Cardiovascular;  Laterality: N/A;  . LEFT HEART CATHETERIZATION WITH CORONARY ANGIOGRAM N/A 12/19/2012   Procedure: LEFT HEART CATHETERIZATION WITH CORONARY ANGIOGRAM;  Surgeon: Josue Hector, MD;  Location: [facility] CATH LAB;  Service: Cardiovascular;  Laterality: N/A;  . LEFT HEART CATHETERIZATION WITH CORONARY/GRAFT ANGIOGRAM N/A 04/19/2013   Procedure: LEFT HEART CATHETERIZATION WITH Beatrix Fetters;  Surgeon: Lorretta Harp, MD;  Location: [facility] CATH LAB;  Service: Cardiovascular;  Laterality: N/A;  . PATCH ANGIOPLASTY Right 04/18/2013   Procedure: PATCH ANGIOPLASTY Right Internal Carotid Artery;  Surgeon: Mal Misty, MD;  Location: [place];  Service: Vascular;  Laterality: Right;  . PERCUTANEOUS CORONARY STENT INTERVENTION (PCI-S) Right 12/19/2012   Procedure: PERCUTANEOUS CORONARY STENT INTERVENTION (PCI-S);  Surgeon: Josue Hector, MD;  Location: [facility] CATH LAB;  Service: Cardiovascular;  Laterality: Right;  . SHOULDER SURGERY        Social History:      Social History   Tobacco Use  . Smoking status: Former Smoker    Packs/day: 1.00    Years: 20.00    Pack years: 20.00    Types: Cigarettes    Quit date: 12/10/2012    Years since quitting: 6.8  . Smokeless tobacco: Never Used  Substance Use Topics  . Alcohol use: No    Alcohol/week: 0.0 standard drinks       Family History :     Family History  Problem Relation Age of Onset  . Diabetes Mother   . Heart disease Mother        before age 94  .  Hyperlipidemia Mother   . Hypertension Mother   . Thyroid disease Father   . Hypertension Father   . AAA (abdominal aortic aneurysm) Father   . Heart disease Brother        before age 31  . Hypertension Brother   . Hyperlipidemia Son   .  Hypertension Son        Home Medications:   Prior to Admission medications   Medication Sig Start Date End Date Taking? Authorizing Provider  ALPRAZolam Duanne Moron) 0.5 MG tablet Take 0.25-0.5 mg by mouth See admin instructions. Take 1/2 tab by mouth every morning & evening and 1 tab at bedtime     [provider]  amLODipine (NORVASC) 10 MG tablet Take 5 mg by mouth 2 (two) times daily.    [provider]  aspirin EC 81 MG tablet Take 81 mg by mouth every morning.     [provider]  atorvastatin (LIPITOR) 80 MG tablet Take 1 tablet (80 mg total) by mouth daily. 06/01/17   Johnson, Clanford L, MD  clopidogrel (PLAVIX) 75 MG tablet Take 1 tablet (75 mg total) by mouth daily. 08/27/18   Satira Sark, MD  colchicine 0.6 MG tablet Take 0.6 mg by mouth daily.     [provider]  gabapentin (NEURONTIN) 400 MG capsule Take 400 mg by mouth 2 (two) times daily.     Rory Percy, MD  hydrALAZINE (APRESOLINE) 50 MG tablet Take 75 mg by mouth 3 (three) times daily. 06/10/19   [provider]  isosorbide dinitrate (ISORDIL) 5 MG tablet Take 5 mg by mouth 2 (two) times daily.    [provider]  levothyroxine (SYNTHROID, LEVOTHROID) 125 MCG tablet Take 125 mcg by mouth every morning.    [provider]  nitroGLYCERIN (NITROSTAT) 0.4 MG SL tablet Place 0.4 mg under the tongue every 5 (five) minutes as needed for chest pain. Not to exceed 3 in 15 minute time frame    [provider]  NOVOLIN 70/30 RELION (70-30) 100 UNIT/ML injection Inject 24 Units into the skin 2 (two) times daily with a meal.  08/28/15   [provider]  ondansetron (ZOFRAN ODT) 8 MG disintegrating tablet Take 1  tablet (8 mg total) by mouth every 8 (eight) hours as needed for nausea or vomiting. 08/14/15   Evalee Jefferson, PA-C  VENTOLIN HFA 108 (90 Base) MCG/ACT inhaler Inhale 2 puffs into the lungs every 6 (six) hours as needed for wheezing or shortness of breath.  04/13/18   [provider]     Allergies:     Allergies  Allergen Reactions  . Penicillins Other (See Comments)    REACTION: Unknown, told as a child Has patient had a PCN reaction causing immediate rash, facial/tongue/throat swelling, SOB or lightheadedness with hypotension: Unknown Has patient had a PCN reaction causing severe rash involving mucus membranes or skin necrosis: Unknown Has patient had a PCN reaction that required hospitalization: Unknown Has patient had a PCN reaction occurring within the last 10 years: No If all of the above answers are "NO", then may proceed with Cephalosporin use.      Physical Exam:   Vitals  Blood pressure (!) 171/59, pulse 74, temperature 98.4 F (36.9 C), temperature source Oral, resp. rate 10, weight 93 kg, SpO2 97 %.  1.  General: axoxo3  2. Psychiatric: euthymic  3. Neurologic: nonfocal  4. HEENMT:  Anicteric, pupils , 1.47m symmetric, direct, consensual intact Neck: no jvd, no bruit  5. Respiratory : CTAB  6. Cardiovascular : Midline scar, rrr s1, s2, no m/g/r  7. Gastrointestinal:  Abd: soft, nt, nd, +bs, obese  8. Skin:  Ext: no c/c/e, no rash  9.Musculoskeletal:  Good ROM    Data Review:    CBC Recent Labs  Lab 10/01/19 2056  WBC 13.7*  HGB 12.6  HCT 39.5  PLT 198  MCV 92.3  MCH 29.4  MCHC 31.9  RDW 14.0  LYMPHSABS 0.7  MONOABS 0.4  EOSABS 0.0  BASOSABS 0.1   ------------------------------------------------------------------------------------------------------------------  Results for orders placed or performed during the hospital encounter of 10/01/19 (from the past 48 hour(s))  CBG monitoring, ED     Status: Abnormal   Collection  Time: 10/01/19  8:49 PM  Result Value Ref Range   Glucose-Capillary 291 (H) 70 - 99 mg/dL  Troponin I (High Sensitivity)     Status: None   Collection Time: 10/01/19  8:56 PM  Result Value Ref Range   Troponin I (High Sensitivity) 16 <18 ng/L    Comment: (NOTE) Elevated high sensitivity troponin I (hsTnI) values and significant  changes across serial measurements may suggest ACS but many other  chronic and acute conditions are known to elevate hsTnI results.  Refer to the "Links" section for chest pain algorithms and additional  guidance. Performed at [facility], [address] [place], [place] [address]   CBC with Differential     Status: Abnormal   Collection Time: 10/01/19  8:56 PM  Result Value Ref Range   WBC 13.7 (H) 4.0 - 10.5 K/uL   RBC 4.28 3.87 - 5.11 MIL/uL   Hemoglobin 12.6 12.0 - 15.0 g/dL   HCT 39.5 36.0 - 46.0 %   MCV 92.3 80.0 - 100.0 fL   MCH 29.4 26.0 - 34.0 pg   MCHC 31.9 30.0 - 36.0 g/dL   RDW 14.0 11.5 - 15.5 %   Platelets 198 150 - 400 K/uL   nRBC 0.0 0.0 - 0.2 %   Neutrophils Relative % 89 %   Neutro Abs 12.1 (H) 1.7 - 7.7 K/uL   Lymphocytes Relative 5 %   Lymphs Abs 0.7 0.7 - 4.0 K/uL   Monocytes Relative 3 %   Monocytes Absolute 0.4 0.1 - 1.0 K/uL   Eosinophils Relative 0 %   Eosinophils Absolute 0.0 0.0 - 0.5 K/uL   Basophils Relative 0 %   Basophils Absolute 0.1 0.0 - 0.1 K/uL   Immature Granulocytes 3 %   Abs Immature Granulocytes 0.47 (H) 0.00 - 0.07 K/uL    Comment: Performed at [facility], [address], [place], [place] [address]  CMP     Status: Abnormal   Collection Time: 10/01/19  8:56 PM  Result Value Ref Range   Sodium 132 (L) 135 - 145 mmol/L   Potassium 5.1 3.5 - 5.1 mmol/L   Chloride 105 98 - 111 mmol/L   CO2 20 (L) 22 - 32 mmol/L   Glucose, Bld 316 (H) 70 - 99 mg/dL   BUN 62 (H) 8 - 23 mg/dL   Creatinine, Ser 3.02 (H) 0.44 - 1.00 mg/dL   Calcium 8.6 (L) 8.9 - 10.3 mg/dL   Total Protein 7.2 6.5 - 8.1 g/dL   Albumin 3.5  3.5 - 5.0 g/dL   AST 24 15 - 41 U/L   ALT 32 0 - 44 U/L   Alkaline Phosphatase 170 (H) 38 - 126 U/L   Total Bilirubin 0.4 0.3 - 1.2 mg/dL   GFR calc non Af Amer 16 (L) >60 mL/min   GFR calc Af Amer 19 (L) >60 mL/min   Anion gap 7 5 - 15    Comment: Performed at [facility], [address] [place], [place] [address]  Respiratory Panel by RT PCR (Flu A&B, Covid) - Nasopharyngeal Swab  Status: None   Collection Time: 10/01/19  8:57 PM   Specimen: Nasopharyngeal Swab  Result Value Ref Range   SARS Coronavirus 2 by RT PCR NEGATIVE NEGATIVE    Comment: (NOTE) SARS-CoV-2 target nucleic acids are NOT DETECTED. The SARS-CoV-2 RNA is generally detectable in upper respiratoy specimens during the acute phase of infection. The lowest concentration of SARS-CoV-2 viral copies this assay can detect is 131 copies/mL. A negative result does not preclude SARS-Cov-2 infection and should not be used as the sole basis for treatment or other patient management decisions. A negative result may occur with  improper specimen collection/handling, submission of specimen other than nasopharyngeal swab, presence of viral mutation(s) within the areas targeted by this assay, and inadequate number of viral copies (<131 copies/mL). A negative result must be combined with clinical observations, patient history, and epidemiological information. The expected result is Negative. Fact Sheet for Patients:  PinkCheek.be Fact Sheet for Healthcare Providers:  GravelBags.it This test is not yet ap proved or cleared by the [place] FDA and  has been authorized for detection and/or diagnosis of SARS-CoV-2 by FDA under an Emergency Use Authorization (EUA). This EUA will remain  in effect (meaning this test can be used) for the duration of the COVID-19 declaration under Section 564(b)(1) of the Act, 21 U.S.C. section 360bbb-3(b)(1), unless the authorization is  terminated or revoked sooner.    Influenza A by PCR NEGATIVE NEGATIVE   Influenza B by PCR NEGATIVE NEGATIVE    Comment: (NOTE) The Xpert Xpress SARS-CoV-2/FLU/RSV assay is intended as an aid in  the diagnosis of influenza from Nasopharyngeal swab specimens and  should not be used as a sole basis for treatment. Nasal washings and  aspirates are unacceptable for Xpert Xpress SARS-CoV-2/FLU/RSV  testing. Fact Sheet for Patients: PinkCheek.be Fact Sheet for Healthcare Providers: GravelBags.it This test is not yet approved or cleared by the [place] FDA and  has been authorized for detection and/or diagnosis of SARS-CoV-2 by  FDA under an Emergency Use Authorization (EUA). This EUA will remain  in effect (meaning this test can be used) for the duration of the  Covid-19 declaration under Section 564(b)(1) of the Act, 21  U.S.C. section 360bbb-3(b)(1), unless the authorization is  terminated or revoked. Performed at [facility], [address], [place], [place] [address]   Urinalysis, Routine w reflex microscopic     Status: Abnormal   Collection Time: 10/01/19  9:56 PM  Result Value Ref Range   Color, Urine YELLOW YELLOW   APPearance CLEAR CLEAR   Specific Gravity, Urine 1.013 1.005 - 1.030   pH 5.0 5.0 - 8.0   Glucose, UA >=500 (A) NEGATIVE mg/dL   Hgb urine dipstick NEGATIVE NEGATIVE   Bilirubin Urine NEGATIVE NEGATIVE   Ketones, ur NEGATIVE NEGATIVE mg/dL   Protein, ur >=300 (A) NEGATIVE mg/dL   Nitrite NEGATIVE NEGATIVE   Leukocytes,Ua NEGATIVE NEGATIVE   RBC / HPF 0-5 0 - 5 RBC/hpf   WBC, UA 0-5 0 - 5 WBC/hpf   Bacteria, UA NONE SEEN NONE SEEN   Squamous Epithelial / LPF 0-5 0 - 5    Comment: Performed at [facility], [address], [place],  [address]    Chemistries  Recent Labs  Lab 10/01/19 2056  NA 132*  K 5.1  CL 105  CO2 20*  GLUCOSE 316*  BUN 62*  CREATININE 3.02*  CALCIUM 8.6*  AST  24  ALT 32  ALKPHOS 170*  BILITOT 0.4   ------------------------------------------------------------------------------------------------------------------  ------------------------------------------------------------------------------------------------------------------  GFR: Estimated Creatinine Clearance: 22.5 mL/min (A) (by C-G formula based on SCr of 3.02 mg/dL (H)). Liver Function Tests: Recent Labs  Lab 10/01/19 2056  AST 24  ALT 32  ALKPHOS 170*  BILITOT 0.4  PROT 7.2  ALBUMIN 3.5   No results for input(s): LIPASE, AMYLASE in the last 168 hours. No results for input(s): AMMONIA in the last 168 hours. Coagulation Profile: No results for input(s): INR, PROTIME in the last 168 hours. Cardiac Enzymes: No results for input(s): CKTOTAL, CKMB, CKMBINDEX, TROPONINI in the last 168 hours. BNP (last 3 results) No results for input(s): PROBNP in the last 8760 hours. HbA1C: No results for input(s): HGBA1C in the last 72 hours. CBG: Recent Labs  Lab 10/01/19 2049  GLUCAP 291*   Lipid Profile: No results for input(s): CHOL, HDL, LDLCALC, TRIG, CHOLHDL, LDLDIRECT in the last 72 hours. Thyroid Function Tests: No results for input(s): TSH, T4TOTAL, FREET4, T3FREE, THYROIDAB in the last 72 hours. Anemia Panel: No results for input(s): VITAMINB12, FOLATE, FERRITIN, TIBC, IRON, RETICCTPCT in the last 72 hours.  --------------------------------------------------------------------------------------------------------------- Urine analysis:    Component Value Date/Time   COLORURINE YELLOW 10/01/2019 2156   APPEARANCEUR CLEAR 10/01/2019 2156   LABSPEC 1.013 10/01/2019 2156   PHURINE 5.0 10/01/2019 2156   GLUCOSEU >=500 (A) 10/01/2019 2156   HGBUR NEGATIVE 10/01/2019 2156   BILIRUBINUR NEGATIVE 10/01/2019 2156   KETONESUR NEGATIVE 10/01/2019 2156   PROTEINUR >=300 (A) 10/01/2019 2156   UROBILINOGEN 0.2 10/26/2013 2229   NITRITE NEGATIVE 10/01/2019 2156   LEUKOCYTESUR NEGATIVE  10/01/2019 2156      Imaging Results:    DG Chest Port 1 View  Result Date: 10/01/2019 CLINICAL DATA:  Cough and chest pain EXAM: PORTABLE CHEST 1 VIEW COMPARISON:  04/18/2018 FINDINGS: Post sternotomy changes. Mild cardiomegaly. No focal airspace disease, pleural effusion or pneumothorax. IMPRESSION: No active disease.  Mild cardiomegaly Electronically Signed   By: Donavan Foil M.D.   On: 10/01/2019 21:23       Assessment & Plan:    Active Problems:   Chest pain   Chest pain  Tele Trop I Check hga1c, lipid Cont Aspirin 35m po qday Cont plavix 737mpo qday Cont Lipitor 8043mo qhs Cont isordil 5mg37m bid Cont hydralazine 50mg83mtid Check cardiac echo Cardiology consult placed in computer.   ARF on CKD stage4 Check renal ultasound Hydrate with ns at 50mL 27mhour Check cmp in am  Hypothyroidism Cont Levothyroxine 125micr6mms po qday  Dm2 Hold Novolin 70/30 Fsbs q4h, ISS  Asthma Cont Albuterol HFA prn  Anxiety Cont Xanax prn      DVT Prophylaxis-   Lovenox - SCDs   AM Labs Ordered, also please review Full Orders  Family Communication: Admission, patients condition and plan of care including tests being ordered have been discussed with the patient  who indicate understanding and agree with the plan and Code Status.  Code Status:  FULL CODE per patient, attempted to contact son,  No response.  Admission status: Observation: Based on patients clinical presentation and evaluation of above clinical data, I have made determination that patient meets Observation criteria at this time.   Time spent in minutes : 70 minutes   Adylene Dlugosz KJani Gravel 10/01/2019 at 10:35 PM

## 2019-10-01 NOTE — ED Notes (Signed)
Pt provided meal tray

## 2019-10-01 NOTE — ED Notes (Signed)
ekg given to Dr. Sabra Heck

## 2019-10-02 ENCOUNTER — Observation Stay (HOSPITAL_BASED_OUTPATIENT_CLINIC_OR_DEPARTMENT_OTHER): Payer: MEDICARE

## 2019-10-02 ENCOUNTER — Observation Stay (HOSPITAL_COMMUNITY): Payer: MEDICARE

## 2019-10-02 DIAGNOSIS — I2510 Atherosclerotic heart disease of native coronary artery without angina pectoris: Secondary | ICD-10-CM | POA: Diagnosis not present

## 2019-10-02 DIAGNOSIS — E669 Obesity, unspecified: Secondary | ICD-10-CM | POA: Diagnosis not present

## 2019-10-02 DIAGNOSIS — Z8249 Family history of ischemic heart disease and other diseases of the circulatory system: Secondary | ICD-10-CM | POA: Diagnosis not present

## 2019-10-02 DIAGNOSIS — N1832 Chronic kidney disease, stage 3b: Secondary | ICD-10-CM

## 2019-10-02 DIAGNOSIS — R079 Chest pain, unspecified: Secondary | ICD-10-CM | POA: Diagnosis not present

## 2019-10-02 DIAGNOSIS — I25110 Atherosclerotic heart disease of native coronary artery with unstable angina pectoris: Secondary | ICD-10-CM | POA: Diagnosis not present

## 2019-10-02 DIAGNOSIS — Z79890 Hormone replacement therapy (postmenopausal): Secondary | ICD-10-CM | POA: Diagnosis not present

## 2019-10-02 DIAGNOSIS — N183 Chronic kidney disease, stage 3 unspecified: Secondary | ICD-10-CM | POA: Diagnosis not present

## 2019-10-02 DIAGNOSIS — E1151 Type 2 diabetes mellitus with diabetic peripheral angiopathy without gangrene: Secondary | ICD-10-CM | POA: Diagnosis not present

## 2019-10-02 DIAGNOSIS — Z955 Presence of coronary angioplasty implant and graft: Secondary | ICD-10-CM | POA: Diagnosis not present

## 2019-10-02 DIAGNOSIS — Z8349 Family history of other endocrine, nutritional and metabolic diseases: Secondary | ICD-10-CM | POA: Diagnosis not present

## 2019-10-02 DIAGNOSIS — Z7902 Long term (current) use of antithrombotics/antiplatelets: Secondary | ICD-10-CM | POA: Diagnosis not present

## 2019-10-02 DIAGNOSIS — M109 Gout, unspecified: Secondary | ICD-10-CM | POA: Diagnosis not present

## 2019-10-02 DIAGNOSIS — I252 Old myocardial infarction: Secondary | ICD-10-CM | POA: Diagnosis not present

## 2019-10-02 DIAGNOSIS — I129 Hypertensive chronic kidney disease with stage 1 through stage 4 chronic kidney disease, or unspecified chronic kidney disease: Secondary | ICD-10-CM | POA: Diagnosis not present

## 2019-10-02 DIAGNOSIS — Z880 Allergy status to penicillin: Secondary | ICD-10-CM | POA: Diagnosis not present

## 2019-10-02 DIAGNOSIS — R072 Precordial pain: Secondary | ICD-10-CM | POA: Diagnosis not present

## 2019-10-02 DIAGNOSIS — I10 Essential (primary) hypertension: Secondary | ICD-10-CM

## 2019-10-02 DIAGNOSIS — N179 Acute kidney failure, unspecified: Secondary | ICD-10-CM

## 2019-10-02 DIAGNOSIS — N184 Chronic kidney disease, stage 4 (severe): Secondary | ICD-10-CM | POA: Diagnosis not present

## 2019-10-02 DIAGNOSIS — Z794 Long term (current) use of insulin: Secondary | ICD-10-CM | POA: Diagnosis not present

## 2019-10-02 DIAGNOSIS — Z87891 Personal history of nicotine dependence: Secondary | ICD-10-CM | POA: Diagnosis not present

## 2019-10-02 DIAGNOSIS — Z833 Family history of diabetes mellitus: Secondary | ICD-10-CM | POA: Diagnosis not present

## 2019-10-02 DIAGNOSIS — Z20822 Contact with and (suspected) exposure to covid-19: Secondary | ICD-10-CM | POA: Diagnosis not present

## 2019-10-02 DIAGNOSIS — E785 Hyperlipidemia, unspecified: Secondary | ICD-10-CM

## 2019-10-02 DIAGNOSIS — E1122 Type 2 diabetes mellitus with diabetic chronic kidney disease: Secondary | ICD-10-CM | POA: Diagnosis not present

## 2019-10-02 DIAGNOSIS — R778 Other specified abnormalities of plasma proteins: Secondary | ICD-10-CM | POA: Diagnosis not present

## 2019-10-02 DIAGNOSIS — E039 Hypothyroidism, unspecified: Secondary | ICD-10-CM | POA: Diagnosis not present

## 2019-10-02 DIAGNOSIS — N19 Unspecified kidney failure: Secondary | ICD-10-CM | POA: Diagnosis not present

## 2019-10-02 DIAGNOSIS — J40 Bronchitis, not specified as acute or chronic: Secondary | ICD-10-CM | POA: Diagnosis not present

## 2019-10-02 DIAGNOSIS — Z951 Presence of aortocoronary bypass graft: Secondary | ICD-10-CM | POA: Diagnosis not present

## 2019-10-02 DIAGNOSIS — Z7982 Long term (current) use of aspirin: Secondary | ICD-10-CM | POA: Diagnosis not present

## 2019-10-02 LAB — CBG MONITORING, ED
Glucose-Capillary: 195 mg/dL — ABNORMAL HIGH (ref 70–99)
Glucose-Capillary: 153 mg/dL — ABNORMAL HIGH (ref 70–99)
Glucose-Capillary: 81 mg/dL (ref 70–99)
Glucose-Capillary: 94 mg/dL (ref 70–99)
Glucose-Capillary: 144 mg/dL — ABNORMAL HIGH (ref 70–99)
Glucose-Capillary: 238 mg/dL — ABNORMAL HIGH (ref 70–99)

## 2019-10-02 LAB — ECHOCARDIOGRAM COMPLETE
Height: 66 in
Weight: 3280.44 oz

## 2019-10-02 LAB — CBC
HCT: 36.6 % (ref 36.0–46.0)
Hemoglobin: 11.5 g/dL — ABNORMAL LOW (ref 12.0–15.0)
MCH: 28.8 pg (ref 26.0–34.0)
MCHC: 31.4 g/dL (ref 30.0–36.0)
MCV: 91.7 fL (ref 80.0–100.0)
Platelets: 187 10*3/uL (ref 150–400)
RBC: 3.99 MIL/uL (ref 3.87–5.11)
RDW: 13.7 % (ref 11.5–15.5)
WBC: 13.4 10*3/uL — ABNORMAL HIGH (ref 4.0–10.5)
nRBC: 0 % (ref 0.0–0.2)

## 2019-10-02 LAB — TROPONIN I (HIGH SENSITIVITY)
Troponin I (High Sensitivity): 36 ng/L — ABNORMAL HIGH (ref <18)
Troponin I (High Sensitivity): 104 ng/L (ref <18)

## 2019-10-02 LAB — LIPID PANEL
Cholesterol: 160 mg/dL (ref 0–200)
HDL: 44 mg/dL (ref >40)
LDL Cholesterol: 79 mg/dL (ref 0–99)
Total CHOL/HDL Ratio: 3.6 RATIO
Triglycerides: 186 mg/dL — ABNORMAL HIGH (ref <150)
VLDL: 37 mg/dL (ref 0–40)

## 2019-10-02 LAB — COMPREHENSIVE METABOLIC PANEL
ALT: 29 U/L (ref 0–44)
AST: 22 U/L (ref 15–41)
Albumin: 3.1 g/dL — ABNORMAL LOW (ref 3.5–5.0)
Alkaline Phosphatase: 141 U/L — ABNORMAL HIGH (ref 38–126)
Anion gap: 9 (ref 5–15)
BUN: 65 mg/dL — ABNORMAL HIGH (ref 8–23)
CO2: 20 mmol/L — ABNORMAL LOW (ref 22–32)
Calcium: 8.8 mg/dL — ABNORMAL LOW (ref 8.9–10.3)
Chloride: 109 mmol/L (ref 98–111)
Creatinine, Ser: 2.82 mg/dL — ABNORMAL HIGH (ref 0.44–1.00)
GFR calc Af Amer: 20 mL/min — ABNORMAL LOW (ref >60)
GFR calc non Af Amer: 17 mL/min — ABNORMAL LOW (ref >60)
Glucose, Bld: 152 mg/dL — ABNORMAL HIGH (ref 70–99)
Potassium: 5 mmol/L (ref 3.5–5.1)
Sodium: 138 mmol/L (ref 135–145)
Total Bilirubin: 0.3 mg/dL (ref 0.3–1.2)
Total Protein: 6.3 g/dL — ABNORMAL LOW (ref 6.5–8.1)

## 2019-10-02 LAB — HEPARIN LEVEL (UNFRACTIONATED)
Heparin Unfractionated: 0.12 IU/mL — ABNORMAL LOW (ref 0.30–0.70)
Heparin Unfractionated: 0.16 IU/mL — ABNORMAL LOW (ref 0.30–0.70)
Heparin Unfractionated: 0.18 IU/mL — ABNORMAL LOW (ref 0.30–0.70)

## 2019-10-02 LAB — PROTIME-INR
INR: 1 (ref 0.8–1.2)
Prothrombin Time: 13.3 seconds (ref 11.4–15.2)

## 2019-10-02 LAB — HEMOGLOBIN A1C
Hgb A1c MFr Bld: 6.8 % — ABNORMAL HIGH (ref 4.8–5.6)
Mean Plasma Glucose: 148.46 mg/dL

## 2019-10-02 LAB — HIV ANTIBODY (ROUTINE TESTING W REFLEX): HIV Screen 4th Generation wRfx: NONREACTIVE

## 2019-10-02 MED ORDER — SODIUM CHLORIDE 0.9 % IV SOLN: INTRAVENOUS | Status: DC

## 2019-10-02 MED ORDER — HYDRALAZINE HCL 25 MG PO TABS
75.0000 mg | ORAL_TABLET | Freq: Three times a day (TID) | ORAL | Status: DC
  Administered 2019-10-02: 75 mg via ORAL
  Filled 2019-10-02: qty 3

## 2019-10-02 MED ORDER — AMLODIPINE BESYLATE 5 MG PO TABS
10.0000 mg | ORAL_TABLET | Freq: Every day | ORAL | Status: DC
  Administered 2019-10-03: 10 mg via ORAL
  Filled 2019-10-02: qty 2

## 2019-10-02 MED ORDER — GABAPENTIN 400 MG PO CAPS
400.0000 mg | ORAL_CAPSULE | Freq: Two times a day (BID) | ORAL | Status: DC
  Administered 2019-10-02 – 2019-10-03 (×3): 400 mg via ORAL
  Filled 2019-10-02 (×3): qty 1

## 2019-10-02 MED ORDER — LEVOTHYROXINE SODIUM 25 MCG PO TABS
125.0000 ug | ORAL_TABLET | ORAL | Status: DC
  Administered 2019-10-02: 125 ug via ORAL
  Filled 2019-10-02 (×2): qty 3

## 2019-10-02 MED ORDER — ONDANSETRON 4 MG PO TBDP: 8.0000 mg | ORAL_TABLET | Freq: Three times a day (TID) | ORAL | Status: DC | PRN

## 2019-10-02 MED ORDER — HEPARIN (PORCINE) 25000 UT/250ML-% IV SOLN
1450.0000 [IU]/h | INTRAVENOUS | Status: DC
  Administered 2019-10-02: 1000 [IU]/h via INTRAVENOUS
  Administered 2019-10-03: 1450 [IU]/h via INTRAVENOUS
  Filled 2019-10-02 (×2): qty 250

## 2019-10-02 MED ORDER — CLOPIDOGREL BISULFATE 75 MG PO TABS
75.0000 mg | ORAL_TABLET | Freq: Every day | ORAL | Status: DC
  Administered 2019-10-02 – 2019-10-03 (×2): 75 mg via ORAL
  Filled 2019-10-02 (×2): qty 1

## 2019-10-02 MED ORDER — ASPIRIN EC 81 MG PO TBEC
81.0000 mg | DELAYED_RELEASE_TABLET | Freq: Every morning | ORAL | Status: DC
  Administered 2019-10-02 – 2019-10-03 (×2): 81 mg via ORAL
  Filled 2019-10-02 (×2): qty 1

## 2019-10-02 MED ORDER — ALPRAZOLAM 0.5 MG PO TABS: 0.2500 mg | ORAL_TABLET | ORAL | Status: DC

## 2019-10-02 MED ORDER — HEPARIN BOLUS VIA INFUSION
2000.0000 [IU] | Freq: Once | INTRAVENOUS | Status: AC
  Administered 2019-10-03: 2000 [IU] via INTRAVENOUS

## 2019-10-02 MED ORDER — HYDRALAZINE HCL 25 MG PO TABS
100.0000 mg | ORAL_TABLET | Freq: Three times a day (TID) | ORAL | Status: DC
  Administered 2019-10-02 – 2019-10-03 (×3): 100 mg via ORAL
  Filled 2019-10-02 (×3): qty 4

## 2019-10-02 MED ORDER — ISOSORBIDE DINITRATE 20 MG PO TABS
20.0000 mg | ORAL_TABLET | Freq: Two times a day (BID) | ORAL | Status: DC
  Administered 2019-10-02 – 2019-10-03 (×2): 20 mg via ORAL
  Filled 2019-10-02 (×4): qty 1

## 2019-10-02 MED ORDER — ALBUTEROL SULFATE HFA 108 (90 BASE) MCG/ACT IN AERS: 2.0000 | INHALATION_SPRAY | Freq: Four times a day (QID) | RESPIRATORY_TRACT | Status: DC | PRN

## 2019-10-02 MED ORDER — AMLODIPINE BESYLATE 5 MG PO TABS
5.0000 mg | ORAL_TABLET | Freq: Once | ORAL | Status: AC
  Administered 2019-10-02: 5 mg via ORAL
  Filled 2019-10-02: qty 1

## 2019-10-02 MED ORDER — HEPARIN BOLUS VIA INFUSION
2500.0000 [IU] | Freq: Once | INTRAVENOUS | Status: AC
  Administered 2019-10-02: 2500 [IU] via INTRAVENOUS

## 2019-10-02 MED ORDER — ATORVASTATIN CALCIUM 40 MG PO TABS
80.0000 mg | ORAL_TABLET | Freq: Every day | ORAL | Status: DC
  Administered 2019-10-02 – 2019-10-03 (×2): 80 mg via ORAL
  Filled 2019-10-02 (×2): qty 2

## 2019-10-02 MED ORDER — AMLODIPINE BESYLATE 5 MG PO TABS: 10.0000 mg | ORAL_TABLET | Freq: Two times a day (BID) | ORAL | Status: DC

## 2019-10-02 MED ORDER — AMLODIPINE BESYLATE 5 MG PO TABS
5.0000 mg | ORAL_TABLET | Freq: Two times a day (BID) | ORAL | Status: DC
  Administered 2019-10-02: 5 mg via ORAL
  Filled 2019-10-02: qty 1

## 2019-10-02 MED ORDER — ISOSORBIDE DINITRATE 5 MG PO TABS
5.0000 mg | ORAL_TABLET | Freq: Two times a day (BID) | ORAL | Status: DC
  Administered 2019-10-02: 5 mg via ORAL
  Filled 2019-10-02 (×3): qty 1

## 2019-10-02 NOTE — Progress Notes (Signed)
PROGRESS NOTE  Donna Howe YOV:[id number] DOB: 03-14-1956 DOA: 10/01/2019 PCP: Rory Percy, MD  HPI/Recap of past 40 hours: 64 year old female with past medical history of obesity, CAD status post CABG 15 years ago with multiple PCI's since then possible non-STEMI in July 2019 as well as stage III chronic kidney disease and diabetes mellitus who presented with chest pain.  In the emergency room, found to have mildly elevated troponin and states that she has been taking her aspirin and Plavix.  She states that time she has missed doses of hydralazine.  Also on admission, patient's creatinine elevated from baseline around 2.2 up to 3.02  Following admission, repeat troponins much more elevated, going from 16 on admission up to 104 within 8 hours.  Patient's creatinine slightly improved this morning down to 2.8.  She is currently chest pain-free although still has a chronic cough.  Assessment/Plan: Principal Problem:   Chest pain in the setting of elevated troponins and extensive CAD history: Seen by cardiology.  With her elevated creatinine, limited options for cath.  Will increase IV fluids and hope to improve her renal function to where she can tolerate this.  Awaiting echocardiogram Active Problems:   Obesity (BMI 30-39.9): Meets criteria for BMI greater than 30.  Malignant hypertension: It is possible some of this could be secondary to fluid retention.  Nevertheless, giving IV fluids to help improve her renal function.  Have increased Imdur and Norvasc.  Already on maximum dose hydralazine.  No beta-blockers or clonidine given borderline bradycardia.  Holding ACE inhibitor/ARB secondary to renal dysfunction   Asthma AKI in the setting of chronic kidney disease (CKD), stage III (moderate) ([place]): See above.    Diabetes mellitus with renal manifestation (HCC)/stage III chronic kidney disease: A1c notes good control at 6.8    Hypothyroidism: Continue Synthroid  Code Status: Full  code  Family Communication: Updated daughter-in-law by phone  Disposition Plan: Cardiac testing to be determined tomorrow following repeat assessment of renal function.  Once that is done, disposition can be made   Consultants:  Cardiology  Procedures:  Echocardiogram done on 1/14: Results pending  Antimicrobials:  None  DVT prophylaxis: Heparin infusion   Objective: Vitals:   10/02/19 0954 10/02/19 1000  BP: (!) 186/66 (!) 180/65  Pulse: 68 64  Resp: 16 13  Temp:    SpO2: 98% 98%    Intake/Output Summary (Last 24 hours) at 10/02/2019 1508 Last data filed at 10/02/2019 0955 Gross per 24 hour  Intake 439.17 ml  Output --  Net 439.17 ml   Filed Weights   10/01/19 2117 10/02/19 0500  Weight: 93 kg 93 kg   Body mass index is 33.09 kg/m.  Exam:   General: Alert and oriented x3, no acute distress  HEENT: Normocephalic atraumatic, mucous membranes are moist  Neck: Supple, no JVD  Cardiovascular: Regular rate and rhythm, S1-S2, 2 out of 6 systolic ejection murmur  Respiratory: Clear to auscultation bilaterally  Abdomen: Soft, nontender, nondistended, positive bowel sounds  Musculoskeletal: No clubbing or cyanosis, trace pitting edema bilaterally  Skin: No skin breaks, tears or lesions  Psychiatry: Appropriate, no evidence of psychoses   Data Reviewed: CBC: Recent Labs  Lab 10/01/19 2056 10/02/19 0425  WBC 13.7* 13.4*  NEUTROABS 12.1*  --   HGB 12.6 11.5*  HCT 39.5 36.6  MCV 92.3 91.7  PLT 198 741   Basic Metabolic Panel: Recent Labs  Lab 10/01/19 2056 10/02/19 0425  NA 132* 138  K 5.1 5.0  CL 105 109  CO2 20* 20*  GLUCOSE 316* 152*  BUN 62* 65*  CREATININE 3.02* 2.82*  CALCIUM 8.6* 8.8*   GFR: Estimated Creatinine Clearance: 24.1 mL/min (A) (by C-G formula based on SCr of 2.82 mg/dL (H)). Liver Function Tests: Recent Labs  Lab 10/01/19 2056 10/02/19 0425  AST 24 22  ALT 32 29  ALKPHOS 170* 141*  BILITOT 0.4 0.3  PROT 7.2  6.3*  ALBUMIN 3.5 3.1*   No results for input(s): LIPASE, AMYLASE in the last 168 hours. No results for input(s): AMMONIA in the last 168 hours. Coagulation Profile: Recent Labs  Lab 10/02/19 0607  INR 1.0   Cardiac Enzymes: No results for input(s): CKTOTAL, CKMB, CKMBINDEX, TROPONINI in the last 168 hours. BNP (last 3 results) No results for input(s): PROBNP in the last 8760 hours. HbA1C: Recent Labs    10/02/19 0425  HGBA1C 6.8*   CBG: Recent Labs  Lab 10/01/19 2259 10/02/19 0129 10/02/19 0407 10/02/19 0920 10/02/19 1247  GLUCAP 222* 195* 153* 81 94   Lipid Profile: Recent Labs    10/02/19 0425  CHOL 160  HDL 44  LDLCALC 79  TRIG 186*  CHOLHDL 3.6   Thyroid Function Tests: No results for input(s): TSH, T4TOTAL, FREET4, T3FREE, THYROIDAB in the last 72 hours. Anemia Panel: No results for input(s): VITAMINB12, FOLATE, FERRITIN, TIBC, IRON, RETICCTPCT in the last 72 hours. Urine analysis:    Component Value Date/Time   COLORURINE YELLOW 10/01/2019 2156   APPEARANCEUR CLEAR 10/01/2019 2156   LABSPEC 1.013 10/01/2019 2156   PHURINE 5.0 10/01/2019 2156   GLUCOSEU >=500 (A) 10/01/2019 2156   HGBUR NEGATIVE 10/01/2019 2156   BILIRUBINUR NEGATIVE 10/01/2019 2156   KETONESUR NEGATIVE 10/01/2019 2156   PROTEINUR >=300 (A) 10/01/2019 2156   UROBILINOGEN 0.2 10/26/2013 2229   NITRITE NEGATIVE 10/01/2019 2156   LEUKOCYTESUR NEGATIVE 10/01/2019 2156   Sepsis Labs: @LABRCNTIP (procalcitonin:4,lacticidven:4)  ) Recent Results (from the past 240 hour(s))  Respiratory Panel by RT PCR (Flu A&B, Covid) - Nasopharyngeal Swab     Status: None   Collection Time: 10/01/19  8:57 PM   Specimen: Nasopharyngeal Swab  Result Value Ref Range Status   SARS Coronavirus 2 by RT PCR NEGATIVE NEGATIVE Final    Comment: (NOTE) SARS-CoV-2 target nucleic acids are NOT DETECTED. The SARS-CoV-2 RNA is generally detectable in upper respiratoy specimens during the acute phase of  infection. The lowest concentration of SARS-CoV-2 viral copies this assay can detect is 131 copies/mL. A negative result does not preclude SARS-Cov-2 infection and should not be used as the sole basis for treatment or other patient management decisions. A negative result may occur with  improper specimen collection/handling, submission of specimen other than nasopharyngeal swab, presence of viral mutation(s) within the areas targeted by this assay, and inadequate number of viral copies (<131 copies/mL). A negative result must be combined with clinical observations, patient history, and epidemiological information. The expected result is Negative. Fact Sheet for Patients:  PinkCheek.be Fact Sheet for Healthcare Providers:  GravelBags.it This test is not yet ap proved or cleared by the [place] FDA and  has been authorized for detection and/or diagnosis of SARS-CoV-2 by FDA under an Emergency Use Authorization (EUA). This EUA will remain  in effect (meaning this test can be used) for the duration of the COVID-19 declaration under Section 564(b)(1) of the Act, 21 U.S.C. section 360bbb-3(b)(1), unless the authorization is terminated or revoked sooner.    Influenza A by PCR NEGATIVE NEGATIVE Final  Influenza B by PCR NEGATIVE NEGATIVE Final    Comment: (NOTE) The Xpert Xpress SARS-CoV-2/FLU/RSV assay is intended as an aid in  the diagnosis of influenza from Nasopharyngeal swab specimens and  should not be used as a sole basis for treatment. Nasal washings and  aspirates are unacceptable for Xpert Xpress SARS-CoV-2/FLU/RSV  testing. Fact Sheet for Patients: PinkCheek.be Fact Sheet for Healthcare Providers: GravelBags.it This test is not yet approved or cleared by the [place] FDA and  has been authorized for detection and/or diagnosis of SARS-CoV-2 by  FDA under  an Emergency Use Authorization (EUA). This EUA will remain  in effect (meaning this test can be used) for the duration of the  Covid-19 declaration under Section 564(b)(1) of the Act, 21  U.S.C. section 360bbb-3(b)(1), unless the authorization is  terminated or revoked. Performed at [facility], [address], [place], [place] [address]       Studies: US RENAL  Result Date: 10/02/2019 CLINICAL DATA:  Renal failure on admission today. History of RIGHT renal cyst. EXAM: RENAL / URINARY TRACT ULTRASOUND COMPLETE COMPARISON:  08/14/2015 CT FINDINGS: Right Kidney: Renal measurements: 12.2 x 4.9 x 5.47 = volume: 168.8 mL. LOWER pole cyst is 3.7 x 2.3 x 2.6 centimeters. No hydronephrosis. Increased renal echogenicity. Left Kidney: Renal measurements: 10.3 x 4.3 x 0.3 centimeter = volume: Is 98.2 mL. No hydronephrosis. Increased renal echogenicity. Bladder: Appears normal for degree of bladder distention. Other: None. IMPRESSION: 1. Increased renal echogenicity bilaterally. 2. Simple cyst in LOWER pole of the RIGHT kidney. 3. No hydronephrosis. Electronically Signed   By: Nolon Nations M.D.   On: 10/02/2019 09:01   DG Chest Port 1 View  Result Date: 10/01/2019 CLINICAL DATA:  Cough and chest pain EXAM: PORTABLE CHEST 1 VIEW COMPARISON:  04/18/2018 FINDINGS: Post sternotomy changes. Mild cardiomegaly. No focal airspace disease, pleural effusion or pneumothorax. IMPRESSION: No active disease.  Mild cardiomegaly Electronically Signed   By: Donavan Foil M.D.   On: 10/01/2019 21:23    Scheduled Meds: . ALPRAZolam  0.25-0.5 mg Oral See admin instructions  . amLODipine  10 mg Oral BID  . aspirin EC  81 mg Oral q morning - 10a  . atorvastatin  80 mg Oral Daily  . clopidogrel  75 mg Oral Daily  . gabapentin  400 mg Oral BID  . hydrALAZINE  100 mg Oral TID  . insulin aspart  0-9 Units Subcutaneous Q4H  . isosorbide dinitrate  20 mg Oral BID  . levothyroxine  125 mcg Oral BH-q7a    Continuous  Infusions: . sodium chloride    . heparin 1,000 Units/hr (10/02/19 6045)     LOS: 0 days     Annita Brod, MD [facility]  To reach me or the doctor on call, go to: [email or web address] Password [facility]  10/02/2019, 3:08 PM

## 2019-10-02 NOTE — Progress Notes (Addendum)
Progress Note  Patient Name: Donna Howe Date of Encounter: 10/03/2019  Primary Cardiologist:  Rozann Lesches, MD  Subjective   No CP overnight, SOB is better Concerned because she was started on doxycycline on 01/11 for 7 day course, has not had it here.   Inpatient Medications    Scheduled Meds: . ALPRAZolam  0.25-0.5 mg Oral See admin instructions  . amLODipine  10 mg Oral Daily  . aspirin EC  81 mg Oral q morning - 10a  . atorvastatin  80 mg Oral Daily  . clopidogrel  75 mg Oral Daily  . gabapentin  400 mg Oral BID  . hydrALAZINE  100 mg Oral TID  . insulin aspart  0-9 Units Subcutaneous Q4H  . isosorbide dinitrate  20 mg Oral BID  . levothyroxine  125 mcg Oral Q0600   Continuous Infusions: . sodium chloride 100 mL/hr at 10/03/19 0537  . heparin 1,450 Units/hr (10/03/19 0316)   PRN Meds: acetaminophen **OR** acetaminophen, albuterol, ondansetron   Vital Signs    Vitals:   10/02/19 2130 10/02/19 2330 10/03/19 0022 10/03/19 0538  BP:   (!) 153/92 (!) 141/77  Pulse: 66 69 68 65  Resp: 16 14 18 18   Temp:   97.9 F (36.6 C) 98.3 F (36.8 C)  TempSrc:   Oral Oral  SpO2: 95% 97% 99% 96%  Weight:      Height:        Intake/Output Summary (Last 24 hours) at 10/03/2019 1610 Last data filed at 10/03/2019 0400 Gross per 24 hour  Intake 1877.47 ml  Output -  Net 1877.47 ml   Filed Weights   10/01/19 2117 10/02/19 0500  Weight: 93 kg 93 kg   Last Weight  Most recent update: 10/02/2019  5:39 AM   Weight  93 kg (205 lb 0.4 oz)           Weight change:    Telemetry    SR, no sig ectopy - Personally Reviewed  ECG    None today - Personally Reviewed  Physical Exam   General: Well developed, well nourished, female appearing in no acute distress. Head: Normocephalic, atraumatic.  Neck: Supple without bruits, JVD not elevated. Lungs:  Resp regular and unlabored, slight insp/exp wheeze, good air exchange Heart: RRR, S1, S2, no S3, S4, or murmur;  no rub. Abdomen: Soft, non-tender, non-distended with normoactive bowel sounds. No hepatomegaly. No rebound/guarding. No obvious abdominal masses. Extremities: No clubbing, cyanosis, no edema. Distal pedal pulses are 2+ bilaterally. Neuro: Alert and oriented X 3. Moves all extremities spontaneously. Psych: Normal affect.  Labs    Hematology Recent Labs  Lab 10/01/19 2056 10/02/19 0425 10/03/19 0616  WBC 13.7* 13.4* 8.7  RBC 4.28 3.99 3.63*  HGB 12.6 11.5* 10.8*  HCT 39.5 36.6 34.1*  MCV 92.3 91.7 93.9  MCH 29.4 28.8 29.8  MCHC 31.9 31.4 31.7  RDW 14.0 13.7 14.2  PLT 198 187 157    Chemistry Recent Labs  Lab 10/01/19 2056 10/02/19 0425 10/03/19 0616  NA 132* 138 139  K 5.1 5.0 4.6  CL 105 109 113*  CO2 20* 20* 21*  GLUCOSE 316* 152* 91  BUN 62* 65* 64*  CREATININE 3.02* 2.82* 2.67*  CALCIUM 8.6* 8.8* 8.1*  PROT 7.2 6.3*  --   ALBUMIN 3.5 3.1*  --   AST 24 22  --   ALT 32 29  --   ALKPHOS 170* 141*  --   BILITOT 0.4 0.3  --  GFRNONAA 16* 17* 19*  GFRAA 19* 20* 21*  ANIONGAP 7 9 5      High Sensitivity Troponin:   Recent Labs  Lab 10/01/19 2056 10/01/19 2300 10/02/19 0426 10/03/19 0616  TROPONINIHS 16 36* 104* 66*     Lab Results  Component Value Date   CHOL 160 10/02/2019   HDL 44 10/02/2019   LDLCALC 79 10/02/2019   TRIG 186 (H) 10/02/2019   CHOLHDL 3.6 10/02/2019   Lab Results  Component Value Date   HGBA1C 6.8 (H) 10/02/2019     Radiology    US RENAL  Result Date: 10/02/2019 CLINICAL DATA:  Renal failure on admission today. History of RIGHT renal cyst. EXAM: RENAL / URINARY TRACT ULTRASOUND COMPLETE COMPARISON:  08/14/2015 CT FINDINGS: Right Kidney: Renal measurements: 12.2 x 4.9 x 5.47 = volume: 168.8 mL. LOWER pole cyst is 3.7 x 2.3 x 2.6 centimeters. No hydronephrosis. Increased renal echogenicity. Left Kidney: Renal measurements: 10.3 x 4.3 x 0.3 centimeter = volume: Is 98.2 mL. No hydronephrosis. Increased renal echogenicity. Bladder:  Appears normal for degree of bladder distention. Other: None. IMPRESSION: 1. Increased renal echogenicity bilaterally. 2. Simple cyst in LOWER pole of the RIGHT kidney. 3. No hydronephrosis. Electronically Signed   By: Nolon Nations M.D.   On: 10/02/2019 09:01   DG Chest Port 1 View  Result Date: 10/01/2019 CLINICAL DATA:  Cough and chest pain EXAM: PORTABLE CHEST 1 VIEW COMPARISON:  04/18/2018 FINDINGS: Post sternotomy changes. Mild cardiomegaly. No focal airspace disease, pleural effusion or pneumothorax. IMPRESSION: No active disease.  Mild cardiomegaly Electronically Signed   By: Donavan Foil M.D.   On: 10/01/2019 21:23   ECHOCARDIOGRAM COMPLETE  Result Date: 10/02/2019   ECHOCARDIOGRAM REPORT   Patient Name:   Donna Howe Date of Exam: 10/02/2019 Medical Rec #:  [id number]        Height:       66.0 in Accession #:    [id number]       Weight:       205.0 lb Date of Birth:  March 09, 1956       BSA:          2.02 m Patient Age:    64 years         BP:           180/65 mmHg Patient Gender: F                HR:           64 bpm. Exam Location:  [facility] Procedure: 2D Echo Indications:    chest pain 786.50  History:        Patient has prior history of Echocardiogram examinations, most                 recent 04/05/2018. Previous Myocardial Infarction and CAD, Prior                 CABG; Risk Factors:Diabetes, Dyslipidemia and Hypertension.  Sonographer:    Jannett Celestine RDCS (AE) Referring Phys: [id number] [place]  1. Left ventricular ejection fraction, by visual estimation, is 60 to 65%. The left ventricle has normal function. There is mildly increased left ventricular hypertrophy.  2. Left ventricular diastolic parameters are indeterminate.  3. The left ventricle has no regional wall motion abnormalities.  4. Global right ventricle has normal systolic function.The right ventricular size is normal. No increase in right ventricular wall thickness.  5. Left atrial size was normal.  6.  Right atrial size was normal.  7. The mitral valve is normal in structure. Trivial mitral valve regurgitation. No evidence of mitral stenosis.  8. The tricuspid valve is not well visualized.  9. The aortic valve is normal in structure. Aortic valve regurgitation is not visualized. No evidence of aortic valve sclerosis or stenosis. 10. The pulmonic valve was not well visualized. Pulmonic valve regurgitation is not visualized. FINDINGS  Left Ventricle: Left ventricular ejection fraction, by visual estimation, is 60 to 65%. The left ventricle has normal function. The left ventricle has no regional wall motion abnormalities. There is mildly increased left ventricular hypertrophy. Left ventricular diastolic parameters are indeterminate. Right Ventricle: The right ventricular size is normal. No increase in right ventricular wall thickness. Global RV systolic function is has normal systolic function. Left Atrium: Left atrial size was normal in size. Right Atrium: Right atrial size was normal in size Pericardium: There is no evidence of pericardial effusion. Mitral Valve: The mitral valve is normal in structure. Trivial mitral valve regurgitation. No evidence of mitral valve stenosis by observation. Tricuspid Valve: The tricuspid valve is not well visualized. Tricuspid valve regurgitation is not demonstrated. Aortic Valve: The aortic valve is normal in structure. Aortic valve regurgitation is not visualized. The aortic valve is structurally normal, with no evidence of sclerosis or stenosis. Aortic valve mean gradient measures 7.9 mmHg. Aortic valve peak gradient measures 13.8 mmHg. Aortic valve area, by VTI measures 1.35 cm. Pulmonic Valve: The pulmonic valve was not well visualized. Pulmonic valve regurgitation is not visualized. Pulmonic regurgitation is not visualized. No evidence of pulmonic stenosis. Aorta: The aortic root is normal in size and structure. Pulmonary Artery: Indeterminant PASP, inadequate TR jet.  IAS/Shunts: No atrial level shunt detected by color flow Doppler.  LEFT VENTRICLE PLAX 2D LVIDd:         4.82 cm  Diastology LVIDs:         2.98 cm  LV e' lateral:   10.90 cm/s LV PW:         1.10 cm  LV E/e' lateral: 12.3 LV IVS:        1.12 cm  LV e' medial:    4.35 cm/s LVOT diam:     1.80 cm  LV E/e' medial:  30.8 LV SV:         74 ml LV SV Index:   35.05 LVOT Area:     2.54 cm  RIGHT VENTRICLE RV S prime:     7.72 cm/s LEFT ATRIUM           Index LA diam:      3.30 cm 1.63 cm/m LA Vol (A2C): 40.3 ml 19.94 ml/m  AORTIC VALVE AV Area (Vmax):    1.56 cm AV Area (Vmean):   1.57 cm AV Area (VTI):     1.35 cm AV Vmax:           186.05 cm/s AV Vmean:          131.952 cm/s AV VTI:            0.454 m AV Peak Grad:      13.8 mmHg AV Mean Grad:      7.9 mmHg LVOT Vmax:         114.00 cm/s LVOT Vmean:        81.600 cm/s LVOT VTI:          0.240 m LVOT/AV VTI ratio: 0.53  AORTA Ao Root diam: 3.00 cm MITRAL VALVE MV Area (  PHT): 2.66 cm              SHUNTS MV PHT:        82.65 msec            Systemic VTI:  0.24 m MV Decel Time: 285 msec              Systemic Diam: 1.80 cm MV E velocity: 134.00 cm/s 103 cm/s MV A velocity: 124.00 cm/s 70.3 cm/s MV E/A ratio:  1.08        1.5  Carlyle Dolly MD Electronically signed by Carlyle Dolly MD Signature Date/Time: 10/02/2019/4:48:31 PM    Final      Cardiac Studies   ECHO:  10/02/2019  1. Left ventricular ejection fraction, by visual estimation, is 60 to 65%. The left ventricle has normal function. There is mildly increased left ventricular hypertrophy.  2. Left ventricular diastolic parameters are indeterminate.  3. The left ventricle has no regional wall motion abnormalities.  4. Global right ventricle has normal systolic function.The right ventricular size is normal. No increase in right ventricular wall thickness.  5. Left atrial size was normal.  6. Right atrial size was normal.  7. The mitral valve is normal in structure. Trivial mitral valve regurgitation.  No evidence of mitral stenosis.  8. The tricuspid valve is not well visualized.  9. The aortic valve is normal in structure. Aortic valve regurgitation is not visualized. No evidence of aortic valve sclerosis or stenosis. 10. The pulmonic valve was not well visualized. Pulmonic valve regurgitation is not visualized.   Patient Profile     64 y.o. female w/ hx CAD (s/p CABG in 2005 with multiple PCI's since includingPTCA to SVG-OM1 in 01/2004,PTCA/DES to LM and prox LCxin 03/2004 and DES to SVG-OM1, PTCA/DES to SVG-OMin 01/2011,PTCA/DES to LM and LCxin 2015, andcutting balloon angioplasty to prox Cx and DES to SVG-RCAin 05/2017, NSTEMI in 03/2018 with DES to mid-RCA and DES to LIMA-LAD), HTN, HLD, Type 2 DM, carotid artery stenosis (s/p R CEA in 2014) and Stage 3 CKD, was admitted 01/13 with chest pain, cards asked to see.   Assessment & Plan    1. Chest pain - mild elevation in ez w/ peak 104, but has crescendo/decrescendo pattern - typical and atypical features of the pain itself - no recurrence since admit - echo w/ nl EF and no WMA - ECG for today pending - may have been demand ischemia in setting of bronchitis - on ASA, Plavix, statin - no BB 2nd hx bradycardia, no ACE/ARB 2nd decreased renal function - keep npo till MD sees, but ok to have a little water  2. Bronchitis - pta was on doxy, prednisone and cough med - per IM, consider restarting these  3. Acute on chronic CKD - baseline Cr 1.8 - Cr 3.02 on admit w/ BUN 62 - now BUN/Cr 64/2.67 - per IM  Otherwise, per IM Principal Problem:   Chest pain Active Problems:   Obesity (BMI 30-39.9)   Essential hypertension   Asthma   Chronic kidney disease (CKD), stage III (moderate) (HCC)   Diabetes mellitus with renal manifestation (HCC)   CAD (coronary artery disease)   AKI (acute kidney injury) ([place])   Hypothyroidism   Elevated troponin    Signed, Rosaria Ferries , PA-C 8:21 AM 10/03/2019 Pager:  [phone]  Attending note Patient seen and discussed with PA Barrett, I agree with her documentation above. History of CAD, presented with chest pain. Fairly atypical symptoms, as there  was a significant positional component. Recent diagnosis of bronchitis. Mild trop elevation to 104 but trending right back down, EKG with chronic ST/T changes without marked change. Echo LVEF 60-65%, no WMAs, normal RV function. With atypical symptoms, mild trop with rapid resolution, and overall higher threshold for ischemic testing given her significant renal dysfunction would not plan for ischemic testing at this time. Symptoms may be pleuritic and related to her bronchitis, did have some wheezing on exam. Can stop heparin gtt, continue medical therapy for CAD. Isordial has been titrated to 20mg  bid. With high bp's hydral increased to 100mg  tid.   We will order a diet, d/c heparin. No further inpatient cardiac testing planned, ok for discharge.   Carlyle Dolly MD

## 2019-10-02 NOTE — Progress Notes (Signed)
ANTICOAGULATION CONSULT NOTE -  Pharmacy Consult for heparin dosing Indication:  Chest pain/ACS  Allergies  Allergen Reactions  . Penicillins Other (See Comments)    REACTION: Unknown, told as a child Has patient had a PCN reaction causing immediate rash, facial/tongue/throat swelling, SOB or lightheadedness with hypotension: Unknown Has patient had a PCN reaction causing severe rash involving mucus membranes or skin necrosis: Unknown Has patient had a PCN reaction that required hospitalization: Unknown Has patient had a PCN reaction occurring within the last 10 years: No If all of the above answers are "NO", then may proceed with Cephalosporin use.     Patient Measurements: Height: 5\' 6"  (167.6 cm) Weight: 205 lb 0.4 oz (93 kg) IBW/kg (Calculated) : 59.3 Heparin Dosing Weight: HEPARIN DW (KG): 79.8 HEPARIN DW (KG): 79.8 HEPARIN DW (KG): 79.8   Vital Signs: BP: 147/78 (01/14 1800) Pulse Rate: 69 (01/14 2330)  Labs: Recent Labs    10/01/19 2056 10/01/19 2300 10/02/19 0425 10/02/19 0426 10/02/19 0607 10/02/19 1341 10/02/19 2307  HGB 12.6  --  11.5*  --   --   --   --   HCT 39.5  --  36.6  --   --   --   --   PLT 198  --  187  --   --   --   --   LABPROT  --   --   --   --  13.3  --   --   INR  --   --   --   --  1.0  --   --   HEPARINUNFRC  --   --   --   --  0.12* 0.16* 0.18*  CREATININE 3.02*  --  2.82*  --   --   --   --   TROPONINIHS 16 36*  --  104*  --   --   --     Estimated Creatinine Clearance: 24.1 mL/min (A) (by C-G formula based on SCr of 2.82 mg/dL (H)).    Assessment: Pharmacy consulted to dose heparin infusion for this 64 yo female with chest pain and  rising Troponins. She has a history of CAD, CKD and HTN. . Baseline CBC is normal.  10/03/19 0000 UPDATE Heparin level:  0.18 IU/mL, below therapeutic goal range RN reports no bleeding complications and IV is running properly  Goal of Therapy:  Heparin level 0.3-0.7 units/ml Monitor platelets by  anticoagulation protocol: Yes   Plan:  Please give another heparin bolus of 2000 units now Increase heparin infusion to 1450 units/hr Check anti-Xa level in 6-8 hours (will coincide w/current daily HL on 1-15) Daily heparin level Continue to monitor H&H and platelets   Despina Pole, Pharm. D. Clinical Pharmacist 10/03/2019 12:00 AM

## 2019-10-02 NOTE — Progress Notes (Addendum)
ANTICOAGULATION CONSULT NOTE - Initial Consult  Pharmacy Consult for heparin dosing Indication:  Chest pain/ACS  Allergies  Allergen Reactions  . Penicillins Other (See Comments)    REACTION: Unknown, told as a child Has patient had a PCN reaction causing immediate rash, facial/tongue/throat swelling, SOB or lightheadedness with hypotension: Unknown Has patient had a PCN reaction causing severe rash involving mucus membranes or skin necrosis: Unknown Has patient had a PCN reaction that required hospitalization: Unknown Has patient had a PCN reaction occurring within the last 10 years: No If all of the above answers are "NO", then may proceed with Cephalosporin use.     Patient Measurements: Weight: 205 lb 0.4 oz (93 kg) Heparin Dosing Weight:     HEPARIN DW (KG): 79.8   Vital Signs: Temp: 98.4 F (36.9 C) (01/13 2055) Temp Source: Oral (01/13 2055) BP: 171/59 (01/13 2100) Pulse Rate: 64 (01/14 0245)  Labs: Recent Labs    10/01/19 2056 10/01/19 2300 10/02/19 0425 10/02/19 0426  HGB 12.6  --  11.5*  --   HCT 39.5  --  36.6  --   PLT 198  --  187  --   CREATININE 3.02*  --  2.82*  --   TROPONINIHS 16 36*  --  104*    Estimated Creatinine Clearance: 24.1 mL/min (A) (by C-G formula based on SCr of 2.82 mg/dL (H)).   Medical History: Past Medical History:  Diagnosis Date  . Anemia   . Asthma   . CAD (coronary artery disease)    Multivessel s/p CABG 2005, numerous PCIs since that time  . Carotid artery disease (HCC)    R CEA  . Chronic kidney disease (CKD), stage III (moderate)   . Essential hypertension   . Gout   . Hyperlipidemia   . Hypothyroidism   . Myocardial infarction ([place])   . PAD (peripheral artery disease) ([place])    Dr. Kellie Simmering  . S/P angioplasty with stent- DES to [facility] and to [facility] to LAD with DES 04/09/18.   04/10/2018  . SBO (small bowel obstruction) ([place]) 2011   Status post lysis of adhesions & hernia repair  . Sinus bradycardia   .  Thrombocytopenia, unspecified ([place])   . Type 2 diabetes mellitus ([place])   . Umbilical hernia     Assessment: Pharmacy consulted to dose heparin infusion for this 64 yo female with chest pain and  rising Troponins. She has a history of CAD, CKD and HTN.   Lovenox 30mg   x1 dose was given  on 10-01-19 at ~2200, so her baseline HL will be elevated. Baseline CBC is low normal.   Goal of Therapy:  Heparin level 0.3-0.7 units/ml Monitor platelets by anticoagulation protocol: Yes   Plan:  No  heparin   bolus  since patient received Lovenox within last 8 hours Start heparin infusion at 1000 units/hr Check anti-Xa level in 6-8 hours and daily while on heparin Continue to monitor H&H and platelets  Despina Pole 10/02/2019,5:38 AM

## 2019-10-02 NOTE — Progress Notes (Signed)
[facility]cover Troponin rising   A/P CP w elevated troponin, rising Add heparin GTT

## 2019-10-02 NOTE — Consult Note (Addendum)
Cardiology Consult    Patient ID: [name]; [id number]; [birth year]   Admit date: 10/01/2019 Date of Consult: 10/02/2019  Primary Care Provider: Rory Percy, MD Primary Cardiologist: Rozann Lesches, MD   Patient Profile    Donna Howe is a 64 y.o. female with past medical history of CAD (s/p CABG in 2005 with multiple PCI's since including PTCA to SVG-OM1 in 01/2004, PTCA/DES to LM and prox LCx in 03/2004 and DES to SVG-OM1, PTCA/DES to SVG-OM in 01/2011, PTCA/DES to LM and LCx in 2015, and cutting balloon angioplasty to prox Cx and DES to SVG-RCA in 05/2017, NSTEMI in 03/2018 with DES to mid-RCA and DES to LIMA-LAD), HTN, HLD, Type 2 DM, carotid artery stenosis (s/p R CEA in 2014) and Stage 3 CKD who is being seen today for the evaluation of chest pain at the request of Dr. Maudie Mercury.   History of Present Illness    Donna Howe most recently had a phone visit with Dr. Domenic Polite in 07/2019 and denied any recent chest pain or palpitations. Reported compliance with ASA and Plavix but was forgetting her TID dosing of Hydralazine at times. No changes were made to her medication regimen.   She presented to [facility] ED on 10/01/2019 for evaluation of chest pain and dyspnea. In talking with the patient today, she reports being in her usual state of health until earlier this week when she developed worsening dyspnea and coughing consistent with her prior bronchitis.She was evaluated by her PCP and started on steroids and Doxycycline. Over the next several days, she reports having intermittent episodes of chest pain which would typically occur with coughing. Notes some left arm pain while sleeping but this improved when she turned over. Glucose had been elevated in the 400's at home which was felt to be secondary to her steroids. Was very nervous about this and taking extra insulin at times. She was notified yesterday that her granddaughter tested positive for COVID and says her chest pain  worsened at that time as she became very upset. Says her pain feels different from her prior angina as she feels intermittent twinges and previously had pressure and felt like something was sitting on her chest.   Initial labs show WBC 13.7, Hgb 12.6, platelets 198, Na+ 132, K+ 5.1 and creatinine 3.02 (previously 2.16 in 04/2018, 2.36 by scanned labs in 03/2019). COVID negative. Initial HS Troponin 16 with repeat values of 36 and 104. Hgb A1c 6.8. FLP shows total cholesterol of 160, triglycerides 186, HDL 44 and LDL 79. EKG shows NSR, HR 86 with nonspecific IVCD and TWI along lateral leads which is similar to prior imaging. CXR with no active cardiopulmonary disease. Renal US showed increased renal echogenicity with a cyst in the lower pole of the right kidney but no evidence of hydronephrosis.   She was started on IVF at 50 mL/hr and repeat labs this AM show improvement in renal function but creatinine remains above baseline at 2.82.   Past Medical History:  Diagnosis Date  . Anemia   . Asthma   . CAD (coronary artery disease)    Multivessel s/p CABG 2005, numerous PCIs since that time  . Carotid artery disease (HCC)    R CEA  . Chronic kidney disease (CKD), stage III (moderate)   . Essential hypertension   . Gout   . Hyperlipidemia   . Hypothyroidism   . Myocardial infarction ([place])   . PAD (peripheral artery disease) (HCC)    Dr.  Kellie Simmering  . S/P angioplasty with stent- DES to [facility] and to LIMA to LAD with DES 04/09/18.   04/10/2018  . SBO (small bowel obstruction) ([place]) 2011   Status post lysis of adhesions & hernia repair  . Sinus bradycardia   . Thrombocytopenia, unspecified ([place])   . Type 2 diabetes mellitus ([place])   . Umbilical hernia     Past Surgical History:  Procedure Laterality Date  . CESAREAN SECTION  1984  . CHOLECYSTECTOMY  2010  . CORONARY ARTERY BYPASS GRAFT  2005  . CORONARY BALLOON ANGIOPLASTY N/A 05/31/2017   Procedure: CORONARY BALLOON ANGIOPLASTY;  Surgeon:  Jettie Booze, MD;  Location: [place] CV LAB;  Service: Cardiovascular;  Laterality: N/A;  . CORONARY STENT INTERVENTION N/A 05/31/2017   Procedure: CORONARY STENT INTERVENTION;  Surgeon: Jettie Booze, MD;  Location: [place] CV LAB;  Service: Cardiovascular;  Laterality: N/A;  . CORONARY STENT INTERVENTION N/A 04/09/2018   Procedure: CORONARY STENT INTERVENTION;  Surgeon: Jettie Booze, MD;  Location: [place] CV [facility];  Service: Cardiovascular;  Laterality: N/A;  SVG RCA  . ENDARTERECTOMY Right 04/18/2013   Procedure: ENDARTERECTOMY CAROTID;  Surgeon: Mal Misty, MD;  Location: [place];  Service: Vascular;  Laterality: Right;  . HERNIA REPAIR  1989  . Incisional hernia repair x2  03/04/2010   Laparoscopic with 35cm mesh by Dr Ronnald Collum  . LEFT HEART CATH AND CORS/GRAFTS ANGIOGRAPHY N/A 05/31/2017   Procedure: LEFT HEART CATH AND CORS/GRAFTS ANGIOGRAPHY;  Surgeon: Jettie Booze, MD;  Location: [place] CV LAB;  Service: Cardiovascular;  Laterality: N/A;  . LEFT HEART CATH AND CORS/GRAFTS ANGIOGRAPHY N/A 04/08/2018   Procedure: LEFT HEART CATH AND CORS/GRAFTS ANGIOGRAPHY;  Surgeon: Jettie Booze, MD;  Location: [place] CV [facility];  Service: Cardiovascular;  Laterality: N/A;  . LEFT HEART CATHETERIZATION WITH CORONARY ANGIOGRAM N/A 12/19/2012   Procedure: LEFT HEART CATHETERIZATION WITH CORONARY ANGIOGRAM;  Surgeon: Josue Hector, MD;  Location: [facility] CATH LAB;  Service: Cardiovascular;  Laterality: N/A;  . LEFT HEART CATHETERIZATION WITH CORONARY/GRAFT ANGIOGRAM N/A 04/19/2013   Procedure: LEFT HEART CATHETERIZATION WITH Beatrix Fetters;  Surgeon: Lorretta Harp, MD;  Location: [facility] CATH LAB;  Service: Cardiovascular;  Laterality: N/A;  . PATCH ANGIOPLASTY Right 04/18/2013   Procedure: PATCH ANGIOPLASTY Right Internal Carotid Artery;  Surgeon: Mal Misty, MD;  Location: [place];  Service: Vascular;  Laterality: Right;  . PERCUTANEOUS CORONARY STENT  INTERVENTION (PCI-S) Right 12/19/2012   Procedure: PERCUTANEOUS CORONARY STENT INTERVENTION (PCI-S);  Surgeon: Josue Hector, MD;  Location: [facility] CATH LAB;  Service: Cardiovascular;  Laterality: Right;  . SHOULDER SURGERY       Home Medications:  Prior to Admission medications   Medication Sig Start Date End Date Taking? Authorizing Provider  ALPRAZolam Duanne Moron) 0.5 MG tablet Take 0.25-0.5 mg by mouth See admin instructions. Take 1/2 tab by mouth every morning & evening and 1 tab at bedtime     [provider]  amLODipine (NORVASC) 10 MG tablet Take 5 mg by mouth 2 (two) times daily.    [provider]  aspirin EC 81 MG tablet Take 81 mg by mouth every morning.     [provider]  atorvastatin (LIPITOR) 80 MG tablet Take 1 tablet (80 mg total) by mouth daily. 06/01/17   Johnson, Clanford L, MD  clopidogrel (PLAVIX) 75 MG tablet Take 1 tablet (75 mg total) by mouth daily. 08/27/18   Satira Sark, MD  colchicine 0.6 MG tablet Take 0.6 mg by mouth daily.     [provider]  gabapentin (NEURONTIN) 400 MG capsule Take 400 mg by mouth 2 (two) times daily.     Rory Percy, MD  hydrALAZINE (APRESOLINE) 50 MG tablet Take 75 mg by mouth 3 (three) times daily. 06/10/19   [provider]  isosorbide dinitrate (ISORDIL) 5 MG tablet Take 5 mg by mouth 2 (two) times daily.    [provider]  levothyroxine (SYNTHROID, LEVOTHROID) 125 MCG tablet Take 125 mcg by mouth every morning.    [provider]  nitroGLYCERIN (NITROSTAT) 0.4 MG SL tablet Place 0.4 mg under the tongue every 5 (five) minutes as needed for chest pain. Not to exceed 3 in 15 minute time frame    [provider]  NOVOLIN 70/30 RELION (70-30) 100 UNIT/ML injection Inject 24 Units into the skin 2 (two) times daily with a meal.  08/28/15   [provider]  ondansetron (ZOFRAN ODT) 8 MG disintegrating tablet Take 1 tablet (8 mg total) by mouth every 8 (eight) hours  as needed for nausea or vomiting. 08/14/15   Evalee Jefferson, PA-C  VENTOLIN HFA 108 (90 Base) MCG/ACT inhaler Inhale 2 puffs into the lungs every 6 (six) hours as needed for wheezing or shortness of breath.  04/13/18   [provider]    Inpatient Medications: Scheduled Meds: . ALPRAZolam  0.25-0.5 mg Oral See admin instructions  . amLODipine  5 mg Oral BID  . aspirin EC  81 mg Oral q morning - 10a  . atorvastatin  80 mg Oral Daily  . clopidogrel  75 mg Oral Daily  . gabapentin  400 mg Oral BID  . hydrALAZINE  75 mg Oral TID  . insulin aspart  0-9 Units Subcutaneous Q4H  . isosorbide dinitrate  5 mg Oral BID  . levothyroxine  125 mcg Oral BH-q7a   Continuous Infusions: . heparin 1,000 Units/hr (10/02/19 8469)   PRN Meds: acetaminophen **OR** acetaminophen, albuterol, ondansetron  Allergies:    Allergies  Allergen Reactions  . Penicillins Other (See Comments)    REACTION: Unknown, told as a child Has patient had a PCN reaction causing immediate rash, facial/tongue/throat swelling, SOB or lightheadedness with hypotension: Unknown Has patient had a PCN reaction causing severe rash involving mucus membranes or skin necrosis: Unknown Has patient had a PCN reaction that required hospitalization: Unknown Has patient had a PCN reaction occurring within the last 10 years: No If all of the above answers are "NO", then may proceed with Cephalosporin use.     Social History:   Social History   Socioeconomic History  . Marital status: Divorced    Spouse name: Not on file  . Number of children: Not on file  . Years of education: Not on file  . Highest education level: Not on file  Occupational History  . Occupation: Disabled  Tobacco Use  . Smoking status: Former Smoker    Packs/day: 1.00    Years: 20.00    Pack years: 20.00    Types: Cigarettes    Quit date: 12/10/2012    Years since quitting: 6.8  . Smokeless tobacco: Never Used  Substance and Sexual Activity  .  Alcohol use: No    Alcohol/week: 0.0 standard drinks  . Drug use: No  . Sexual activity: Not Currently  Other Topics Concern  . Not on file  Social History Narrative   Lives with mother.   Social Determinants of Health  Financial Resource Strain:   . Difficulty of Paying Living Expenses: Not on file  Food Insecurity:   . Worried About Charity fundraiser in the Last Year: Not on file  . Ran Out of Food in the Last Year: Not on file  Transportation Needs:   . Lack of Transportation (Medical): Not on file  . Lack of Transportation (Non-Medical): Not on file  Physical Activity:   . Days of Exercise per Week: Not on file  . Minutes of Exercise per Session: Not on file  Stress:   . Feeling of Stress : Not on file  Social Connections:   . Frequency of Communication with Friends and Family: Not on file  . Frequency of Social Gatherings with Friends and Family: Not on file  . Attends Religious Services: Not on file  . Active Member of Clubs or Organizations: Not on file  . Attends Archivist Meetings: Not on file  . Marital Status: Not on file  Intimate Partner Violence:   . Fear of Current or Ex-Partner: Not on file  . Emotionally Abused: Not on file  . Physically Abused: Not on file  . Sexually Abused: Not on file     Family History:    Family History  Problem Relation Age of Onset  . Diabetes Mother   . Heart disease Mother        before age 23  . Hyperlipidemia Mother   . Hypertension Mother   . Thyroid disease Father   . Hypertension Father   . AAA (abdominal aortic aneurysm) Father   . Heart disease Brother        before age 35  . Hypertension Brother   . Hyperlipidemia Son   . Hypertension Son       Review of Systems    General:  No chills, fever, night sweats or weight changes.  Cardiovascular:  No edema, orthopnea, palpitations, paroxysmal nocturnal dyspnea. Positive for chest pain and dyspnea on exertion.  Dermatological: No rash,  lesions/masses Respiratory: Positive for cough and dyspnea. Urologic: No hematuria, dysuria Abdominal:   No nausea, vomiting, diarrhea, bright red blood per rectum, melena, or hematemesis Neurologic:  No visual changes, wkns, changes in mental status. All other systems reviewed and are otherwise negative except as noted above.  Physical Exam/Data    Vitals:   10/02/19 0500 10/02/19 0700 10/02/19 0954 10/02/19 1000  BP:   (!) 186/66 (!) 180/65  Pulse:   68 64  Resp:  15 16 13   Temp:      TempSrc:      SpO2:   98% 98%  Weight: 93 kg     Height: 5\' 6"  (1.676 m)       Intake/Output Summary (Last 24 hours) at 10/02/2019 1128 Last data filed at 10/02/2019 0955 Gross per 24 hour  Intake 439.17 ml  Output --  Net 439.17 ml   Filed Weights   10/01/19 2117 10/02/19 0500  Weight: 93 kg 93 kg   Body mass index is 33.09 kg/m.   General: Pleasant Caucasian female appearing in NAD Psych: Normal affect. Neuro: Alert and oriented X 3. Moves all extremities spontaneously. HEENT: Normal  Neck: Supple without bruits or JVD. Lungs:  Resp regular and unlabored, expiratory wheezing along upper lung fields. Heart: RRR no s3, s4, or murmurs. Abdomen: Soft, non-tender, non-distended, BS + x 4.  Extremities: No clubbing, cyanosis or edema. DP/PT/Radials 2+ and equal bilaterally.   EKG:  The EKG was personally reviewed and  demonstrates: NSR, HR 86 with nonspecific IVCD and TWI along lateral leads    Labs/Studies     Relevant CV Studies:  Echocardiogram: 03/2018 Study Conclusions  - Left ventricle: The cavity size was normal. Wall thickness was   normal. Systolic function was normal. The estimated ejection   fraction was in the range of 60% to 65%. Wall motion was normal;   there were no regional wall motion abnormalities. Features are   consistent with a pseudonormal left ventricular filling pattern,   with concomitant abnormal relaxation and increased filling   pressure (grade 2  diastolic dysfunction). Doppler parameters are   consistent with high ventricular filling pressure. - Aortic valve: Valve area (VTI): 1.56 cm^2. Valve area (Vmax):   1.63 cm^2. - Mitral valve: There was mild regurgitation. - Left atrium: The atrium was mildly dilated. - Technically adequate study.   Cardiac Catheterization: 03/2018  Non-stenotic Ost LM to LM lesion previously treated.  Ost LAD to Prox LAD lesion is 80% stenosed. LIMA to LAD is patent with a proximal 60% lesion.  Non-stenotic Ost Cx to Mid Cx lesion previously treated.  Prox Cx to Mid Cx lesion is 50% stenosed.  Balloon angioplasty was performed.  1st Mrg-1 lesion is 70% stenosed.  Ost 1st Mrg lesion is 80% stenosed.  1st Mrg-2 lesion is 10% stenosed.  Origin to Prox Graft lesion is 100% stenosed. SVG to OM is occluded.  Previously placed Origin drug eluting stent is widely patent.  Balloon angioplasty was performed.  Mid RCA lesion is 90% stenosed. THis is past the insertion of the graft.  LV end diastolic pressure is mildly elevated.  There is no aortic valve stenosis.  Origin lesion is 60% stenosed.     Recommend dual antiplatelet therapy with Aspirin 81mg  daily and Clopidogrel 75mg  daily long-term (beyond 12 months) because of extensive CAD, and multiple stents..   Will look into repeat PCI attempt tomorrow if Cr is ok.  Could consider redo CABG eval as well given extensive circ disease and now two lesions in the SVG to RCA in the last 12 months, proximal LIMA lesion.  Will discuss with interventional colleagues.  Coronary Stent Intervention: 03/2018   Non-stenotic Ost LM to LM lesion previously treated.  Ost LAD to Prox LAD lesion is 80% stenosed.  Non-stenotic Ost Cx to Mid Cx lesion previously treated.  1st Mrg-1 lesion is 70% stenosed.  Ost 1st Mrg lesion is 80% stenosed.  1st Mrg-2 lesion is 10% stenosed.  Prox Cx to Mid Cx lesion is 50% stenosed.  Patent stent in the ostium  of the SVG to the RCA  Mid RCA lesion is 90% stenosed.  A drug-eluting stent was successfully placed using a STENT SIERRA 2.50 X 15 MM, through the SVG to RCA graft.  Post intervention, there is a 0% residual stenosis.  Origin lesion is 70% stenosed of the LIMA to LAD.  A drug-eluting stent was successfully placed using a STENT SYNERGY DES 3X12, postdilated to 3.25 mm.  Post intervention, there is a 0% residual stenosis.    Recommend uninterrupted dual antiplatelet therapy with Aspirin 81mg  daily and Ticagrelor 90mg  twice daily for a minimum of 12 months (ACS - Class I recommendation).    Laboratory Data:  Chemistry Recent Labs  Lab 10/01/19 2056 10/02/19 0425  NA 132* 138  K 5.1 5.0  CL 105 109  CO2 20* 20*  GLUCOSE 316* 152*  BUN 62* 65*  CREATININE 3.02* 2.82*  CALCIUM 8.6* 8.8*  GFRNONAA  16* 17*  GFRAA 19* 20*  ANIONGAP 7 9    Recent Labs  Lab 10/01/19 2056 28-Oct-2019 0425  PROT 7.2 6.3*  ALBUMIN 3.5 3.1*  AST 24 22  ALT 32 29  ALKPHOS 170* 141*  BILITOT 0.4 0.3   Hematology Recent Labs  Lab 10/01/19 2056 10-28-19 0425  WBC 13.7* 13.4*  RBC 4.28 3.99  HGB 12.6 11.5*  HCT 39.5 36.6  MCV 92.3 91.7  MCH 29.4 28.8  MCHC 31.9 31.4  RDW 14.0 13.7  PLT 198 187   Cardiac EnzymesNo results for input(s): TROPONINI in the last 168 hours. No results for input(s): TROPIPOC in the last 168 hours.  BNPNo results for input(s): BNP, PROBNP in the last 168 hours.  DDimer No results for input(s): DDIMER in the last 168 hours.  Radiology/Studies:  US RENAL  Result Date: 10-28-19 CLINICAL DATA:  Renal failure on admission today. History of RIGHT renal cyst. EXAM: RENAL / URINARY TRACT ULTRASOUND COMPLETE COMPARISON:  08/14/2015 CT FINDINGS: Right Kidney: Renal measurements: 12.2 x 4.9 x 5.47 = volume: 168.8 mL. LOWER pole cyst is 3.7 x 2.3 x 2.6 centimeters. No hydronephrosis. Increased renal echogenicity. Left Kidney: Renal measurements: 10.3 x 4.3 x 0.3  centimeter = volume: Is 98.2 mL. No hydronephrosis. Increased renal echogenicity. Bladder: Appears normal for degree of bladder distention. Other: None. IMPRESSION: 1. Increased renal echogenicity bilaterally. 2. Simple cyst in LOWER pole of the RIGHT kidney. 3. No hydronephrosis. Electronically Signed   By: Nolon Nations M.D.   On: 10/28/19 09:01   DG Chest Port 1 View  Result Date: 10/01/2019 CLINICAL DATA:  Cough and chest pain EXAM: PORTABLE CHEST 1 VIEW COMPARISON:  04/18/2018 FINDINGS: Post sternotomy changes. Mild cardiomegaly. No focal airspace disease, pleural effusion or pneumothorax. IMPRESSION: No active disease.  Mild cardiomegaly Electronically Signed   By: Donavan Foil M.D.   On: 10/01/2019 21:23     Assessment & Plan    1. Atypical Chest Pain/ Elevated Troponin Values - her presentation overall seems atypical for a cardiac etiology and most consistent with pleuritic chest pain in the setting of recently diagnosed bronchitis. Says her current symptoms feel different from her prior angina.  -  Initial HS Troponin 16 with repeat values of 36 and 104. EKG shows NSR, HR 86 with nonspecific IVCD and TWI along lateral leads which is similar to prior imaging.  - given her AKI, would not anticipate a repeat catheterization this admission as she is at high-risk for contrast induced nephropathy. Will order a repeat echocardiogram to reassess LV function and wall motion. She is on Isordil 5mg  BID and this could be further titrated prior to discharge if needed.   2. CAD - s/p CABG in 2005 with multiple PCI's since including PTCA to SVG-OM1 in 01/2004, PTCA/DES to LM and prox LCx in 03/2004 and DES to SVG-OM1, PTCA/DES to SVG-OM in 01/2011, PTCA/DES to LM and LCx in 2015, and cutting balloon angioplasty to prox Cx and DES to SVG-RCA in 05/2017, NSTEMI in 03/2018 with DES to mid-RCA and DES to LIMA-LAD - continue ASA, Plavix, Isordil, Amlodipine and statin therapy. Not on BB given prior  bradycardia.   3. HTN - BP elevated to 166/59 - 186/66 while in the ED.  - she has been continued on PTA Amlodipine, Hydralazine and Isordil. She just recently received her AM medications. If BP remains elevated, would titrate Hydralazine to 100mg  TID.   4. HLD - followed by PCP. Goal LDL is less  than 56 with known CAD. Remains on Atorvastatin 80mg  daily.   5. Carotid Artery Stenosis  - s/p R CEA in 2014. Remains on ASA, Plavix and statin therapy.   6. Acute on Chronic Stage 3-4 CKD -  Baseline creatinine ~ 2.0. Elevated to 3.02 on admission, improving to 2.82 this AM following administration of IVF.    For questions or updates, please contact [place] Please consult [email or web address] for contact info under Cardiology/STEMI.  Signed, Erma Heritage, PA-C 10/02/2019, 11:28 AM Pager: [phone]   Attending note  Patient seen and discussed with PA Ahmed Prima, I agree with her documentation above. 64 yo female history of CAD with prior CABG in 2005, multiple PCIs including most recent 03/2018 to mid RCA and LIMA-LAD great. History of carotid stenosis s/p right CEA, CKD III, HL, PAD presents with chest pain.  Reports symptoms started Monday. Pressing like feeling midchest into left chest and arm. 7/10 in severity, some SOB. +cough, some fever. She reports recent diagnosis of bronchitis. Pain worst with laying on left side. WOuld come and go lasting a few minutes at a time. Similar symptoms on and off on Tuesday. More severe episode on Wednesday while sitting on bed. Toogk NG x 2 with some relief, called 911.    ER vitals: p 86 bp 166/66 97% RA WBC 13.7 Hgb 12.6 Plt 198 K 5.1 Cr 3.02  hstrop 16-->36-->104--> COVID neg, Flu neg CXR no acute process EKG SR, lateral TWIs. Lateral precordial ST depressions more prominent than baseline.  Echo pending   Presents with chest pain, mixed charactersitics with some atypical details perhaps related to recent/ongoing bronchitis. Still with  some active wheezing on exam.  Trop mild thus far from has a positive delta, up about 7X from presentation trop. EKG with SR, chronic lateral ST/T changes but appear more prominent (particularly V5). Follow trop trend and EKGs, f/u echo. Will be a decision based on this data as it comes in and her renal function and symptoms whether or not invasive ischemic testing is pursued tomorrow.   Medical therapy with ASA 81, atorva 80, plavix 75 (home regimen), hep gtt. No beta blocker due to history of bradycardia, no ACE/ARB due to renal dyfunction. Would make npo toniight.   AKI on CKD, baseline Cr 1.8 to 2.35. On admit up to 3.02, being managed by primary team.    Carlyle Dolly MD

## 2019-10-02 NOTE — Progress Notes (Signed)
  Echocardiogram 2D Echocardiogram has been performed.  Jannett Celestine 10/02/2019, 1:48 PM

## 2019-10-02 NOTE — ED Notes (Signed)
Date and time results received: 10/02/19 05:14 (use smartphrase ".now" to insert current time)  Test: troponin Critical Value: 104  Name of Provider Notified: Dr. Maudie Mercury  Orders Received? Or Actions Taken?: provider notified

## 2019-10-02 NOTE — ED Notes (Signed)
Pharmacist notified of Heparin level of 0.16. Pharmacist states he will make adjustments to the Heparin drip order.

## 2019-10-02 NOTE — Progress Notes (Signed)
ANTICOAGULATION CONSULT NOTE -  Pharmacy Consult for heparin dosing Indication:  Chest pain/ACS  Allergies  Allergen Reactions  . Penicillins Other (See Comments)    REACTION: Unknown, told as a child Has patient had a PCN reaction causing immediate rash, facial/tongue/throat swelling, SOB or lightheadedness with hypotension: Unknown Has patient had a PCN reaction causing severe rash involving mucus membranes or skin necrosis: Unknown Has patient had a PCN reaction that required hospitalization: Unknown Has patient had a PCN reaction occurring within the last 10 years: No If all of the above answers are "NO", then may proceed with Cephalosporin use.     Patient Measurements: Height: 5\' 6"  (167.6 cm) Weight: 205 lb 0.4 oz (93 kg) IBW/kg (Calculated) : 59.3 Heparin Dosing Weight: HEPARIN DW (KG): 79.8 HEPARIN DW (KG): 79.8 HEPARIN DW (KG): 79.8   Vital Signs: BP: 180/65 (01/14 1000) Pulse Rate: 64 (01/14 1000)  Labs: Recent Labs    10/01/19 2056 10/01/19 2300 10/02/19 0425 10/02/19 0426 10/02/19 0607 10/02/19 1341  HGB 12.6  --  11.5*  --   --   --   HCT 39.5  --  36.6  --   --   --   PLT 198  --  187  --   --   --   LABPROT  --   --   --   --  13.3  --   INR  --   --   --   --  1.0  --   HEPARINUNFRC  --   --   --   --  0.12* 0.16*  CREATININE 3.02*  --  2.82*  --   --   --   TROPONINIHS 16 36*  --  104*  --   --     Estimated Creatinine Clearance: 24.1 mL/min (A) (by C-G formula based on SCr of 2.82 mg/dL (H)).   Medical History: Past Medical History:  Diagnosis Date  . Anemia   . Asthma   . CAD (coronary artery disease)    Multivessel s/p CABG 2005, numerous PCIs since that time  . Carotid artery disease (HCC)    R CEA  . Chronic kidney disease (CKD), stage III (moderate)   . Essential hypertension   . Gout   . Hyperlipidemia   . Hypothyroidism   . Myocardial infarction ([place])   . PAD (peripheral artery disease) ([place])    Dr. Kellie Simmering  . S/P angioplasty  with stent- DES to [facility] and to [facility] to LAD with DES 04/09/18.   04/10/2018  . SBO (small bowel obstruction) ([place]) 2011   Status post lysis of adhesions & hernia repair  . Sinus bradycardia   . Thrombocytopenia, unspecified ([place])   . Type 2 diabetes mellitus ([place])   . Umbilical hernia     Assessment: Pharmacy consulted to dose heparin infusion for this 64 yo female with chest pain and  rising Troponins. She has a history of CAD, CKD and HTN. . Baseline CBC is normal.  Heparin level 0.16   Goal of Therapy:  Heparin level 0.3-0.7 units/ml Monitor platelets by anticoagulation protocol: Yes   Plan:  Rebolus heparin 2500 units x 1 Increase heparin infusion to 1250 units/hr Check anti-Xa level in 6-8 hours and daily while on heparin Continue to monitor H&H and platelets  Margot Ables, PharmD Clinical Pharmacist 10/02/2019 3:52 PM

## 2019-10-03 ENCOUNTER — Encounter (HOSPITAL_COMMUNITY): Payer: Self-pay | Admitting: Internal Medicine

## 2019-10-03 DIAGNOSIS — I25110 Atherosclerotic heart disease of native coronary artery with unstable angina pectoris: Secondary | ICD-10-CM | POA: Diagnosis not present

## 2019-10-03 DIAGNOSIS — Z20822 Contact with and (suspected) exposure to covid-19: Secondary | ICD-10-CM | POA: Diagnosis not present

## 2019-10-03 DIAGNOSIS — E1122 Type 2 diabetes mellitus with diabetic chronic kidney disease: Secondary | ICD-10-CM | POA: Diagnosis not present

## 2019-10-03 DIAGNOSIS — N1832 Chronic kidney disease, stage 3b: Secondary | ICD-10-CM | POA: Diagnosis not present

## 2019-10-03 DIAGNOSIS — Z951 Presence of aortocoronary bypass graft: Secondary | ICD-10-CM | POA: Diagnosis not present

## 2019-10-03 DIAGNOSIS — I129 Hypertensive chronic kidney disease with stage 1 through stage 4 chronic kidney disease, or unspecified chronic kidney disease: Secondary | ICD-10-CM | POA: Diagnosis not present

## 2019-10-03 DIAGNOSIS — M109 Gout, unspecified: Secondary | ICD-10-CM | POA: Diagnosis not present

## 2019-10-03 DIAGNOSIS — J40 Bronchitis, not specified as acute or chronic: Secondary | ICD-10-CM | POA: Diagnosis not present

## 2019-10-03 DIAGNOSIS — E669 Obesity, unspecified: Secondary | ICD-10-CM | POA: Diagnosis not present

## 2019-10-03 DIAGNOSIS — N179 Acute kidney failure, unspecified: Secondary | ICD-10-CM | POA: Diagnosis not present

## 2019-10-03 DIAGNOSIS — E039 Hypothyroidism, unspecified: Secondary | ICD-10-CM | POA: Diagnosis not present

## 2019-10-03 DIAGNOSIS — N184 Chronic kidney disease, stage 4 (severe): Secondary | ICD-10-CM | POA: Diagnosis not present

## 2019-10-03 DIAGNOSIS — E785 Hyperlipidemia, unspecified: Secondary | ICD-10-CM | POA: Diagnosis not present

## 2019-10-03 DIAGNOSIS — R072 Precordial pain: Secondary | ICD-10-CM

## 2019-10-03 LAB — GLUCOSE, CAPILLARY
Glucose-Capillary: 73 mg/dL (ref 70–99)
Glucose-Capillary: 98 mg/dL (ref 70–99)
Glucose-Capillary: 94 mg/dL (ref 70–99)

## 2019-10-03 LAB — CBC
HCT: 34.1 % — ABNORMAL LOW (ref 36.0–46.0)
Hemoglobin: 10.8 g/dL — ABNORMAL LOW (ref 12.0–15.0)
MCH: 29.8 pg (ref 26.0–34.0)
MCHC: 31.7 g/dL (ref 30.0–36.0)
MCV: 93.9 fL (ref 80.0–100.0)
Platelets: 157 10*3/uL (ref 150–400)
RBC: 3.63 MIL/uL — ABNORMAL LOW (ref 3.87–5.11)
RDW: 14.2 % (ref 11.5–15.5)
WBC: 8.7 10*3/uL (ref 4.0–10.5)
nRBC: 0 % (ref 0.0–0.2)

## 2019-10-03 LAB — BASIC METABOLIC PANEL
Anion gap: 5 (ref 5–15)
BUN: 64 mg/dL — ABNORMAL HIGH (ref 8–23)
CO2: 21 mmol/L — ABNORMAL LOW (ref 22–32)
Calcium: 8.1 mg/dL — ABNORMAL LOW (ref 8.9–10.3)
Chloride: 113 mmol/L — ABNORMAL HIGH (ref 98–111)
Creatinine, Ser: 2.67 mg/dL — ABNORMAL HIGH (ref 0.44–1.00)
GFR calc Af Amer: 21 mL/min — ABNORMAL LOW (ref >60)
GFR calc non Af Amer: 19 mL/min — ABNORMAL LOW (ref >60)
Glucose, Bld: 91 mg/dL (ref 70–99)
Potassium: 4.6 mmol/L (ref 3.5–5.1)
Sodium: 139 mmol/L (ref 135–145)

## 2019-10-03 LAB — URINE CULTURE: Culture: NO GROWTH

## 2019-10-03 LAB — HEPARIN LEVEL (UNFRACTIONATED): Heparin Unfractionated: 0.61 IU/mL (ref 0.30–0.70)

## 2019-10-03 LAB — TROPONIN I (HIGH SENSITIVITY): Troponin I (High Sensitivity): 66 ng/L — ABNORMAL HIGH (ref <18)

## 2019-10-03 MED ORDER — LEVOTHYROXINE SODIUM 25 MCG PO TABS
125.0000 ug | ORAL_TABLET | Freq: Every day | ORAL | Status: DC
  Administered 2019-10-03: 125 ug via ORAL
  Filled 2019-10-03: qty 1

## 2019-10-03 MED ORDER — ALBUTEROL SULFATE (2.5 MG/3ML) 0.083% IN NEBU: 2.5000 mg | INHALATION_SOLUTION | Freq: Four times a day (QID) | RESPIRATORY_TRACT | Status: DC | PRN

## 2019-10-03 NOTE — Progress Notes (Signed)
Nsg Discharge Note  Admit Date:  10/01/2019 Discharge date: 10/03/2019   Donna Howe to be D/C'd Home per MD order.  AVS completed.  Copy for chart, and copy for patient signed, and dated. Patient able to verbalize understanding.  Discharge Medication: Allergies as of 10/03/2019      Reactions   Penicillins Other (See Comments)   REACTION: Unknown, told as a child Has patient had a PCN reaction causing immediate rash, facial/tongue/throat swelling, SOB or lightheadedness with hypotension: Unknown Has patient had a PCN reaction causing severe rash involving mucus membranes or skin necrosis: Unknown Has patient had a PCN reaction that required hospitalization: Unknown Has patient had a PCN reaction occurring within the last 10 years: No If all of the above answers are "NO", then may proceed with Cephalosporin use.      Medication List    TAKE these medications   ALPRAZolam 0.5 MG tablet Commonly known as: XANAX Take 0.25-0.5 mg by mouth See admin instructions. Take 1/2 tab by mouth every morning & evening and 1 tab at bedtime   amLODipine 10 MG tablet Commonly known as: NORVASC Take 10 mg by mouth daily.   aspirin EC 81 MG tablet Take 81 mg by mouth every morning.   atorvastatin 80 MG tablet Commonly known as: LIPITOR Take 1 tablet (80 mg total) by mouth daily. What changed: when to take this   clopidogrel 75 MG tablet Commonly known as: PLAVIX Take 1 tablet (75 mg total) by mouth daily.   colchicine 0.6 MG tablet Take 0.6 mg by mouth daily.   doxycycline 100 MG tablet Commonly known as: VIBRA-TABS Take 100 mg by mouth 2 (two) times daily. 7 day course starting on 09/29/2019   gabapentin 400 MG capsule Commonly known as: NEURONTIN Take 400 mg by mouth 2 (two) times daily.   hydrALAZINE 50 MG tablet Commonly known as: APRESOLINE Take 75 mg by mouth 3 (three) times daily.   isosorbide dinitrate 5 MG tablet Commonly known as: ISORDIL Take 5 mg by mouth 2 (two)  times daily.   levothyroxine 125 MCG tablet Commonly known as: SYNTHROID Take 125 mcg by mouth every morning.   nitroGLYCERIN 0.4 MG SL tablet Commonly known as: NITROSTAT Place 0.4 mg under the tongue every 5 (five) minutes as needed for chest pain. Not to exceed 3 in 15 minute time frame   NovoLIN 70/30 ReliOn (70-30) 100 UNIT/ML injection Generic drug: insulin NPH-regular Human Inject 24 Units into the skin 2 (two) times daily with a meal.   ondansetron 8 MG disintegrating tablet Commonly known as: Zofran ODT Take 1 tablet (8 mg total) by mouth every 8 (eight) hours as needed for nausea or vomiting.   predniSONE 20 MG tablet Commonly known as: DELTASONE Take 40 mg by mouth daily with breakfast. 4 day course starting on 09/29/2019   Ventolin HFA 108 (90 Base) MCG/ACT inhaler Generic drug: albuterol Inhale 2 puffs into the lungs every 6 (six) hours as needed for wheezing or shortness of breath.   Virtussin A/C 100-10 MG/5ML syrup Generic drug: guaiFENesin-codeine Take 5 mLs by mouth 3 (three) times daily as needed for cough.       Discharge Assessment: Vitals:   10/03/19 0742 10/03/19 0913  BP:  (!) 146/84  Pulse:  64  Resp:    Temp:    SpO2: 97%    Skin clean, dry and intact without evidence of skin break down, no evidence of skin tears noted. IV catheter discontinued intact. Site  without signs and symptoms of complications - no redness or edema noted at insertion site, patient denies c/o pain - only slight tenderness at site.  Dressing with slight pressure applied.  D/c Instructions-Education: Discharge instructions given to patient with verbalized understanding. D/c education completed with patient including follow up instructions, medication list, d/c activities limitations if indicated, with other d/c instructions as indicated by MD - patient able to verbalize understanding, all questions fully answered. Patient instructed to return to ED, call 911, or call MD for  any changes in condition.  Patient escorted via [place], and D/C home via private auto.  Zenaida Deed, RN 10/03/2019 12:24 PM

## 2019-10-03 NOTE — Discharge Summary (Signed)
Discharge Summary  Donna Howe HUT:[id number] DOB: 08/29/1956  PCP: Rory Percy, MD  Admit date: 10/01/2019 Discharge date: 10/03/2019  Time spent: 25 minutes   Recommendations for Outpatient Follow-up:  1. Prior to admission, patient had been put on a steroid taper as well as oral doxycycline.  Would recommend she actually complete both of these. 2. Patient will follow up with her PCP as scheduled on February 9.  Recommend that her renal function be checked at this visit  Discharge Diagnoses:  Active Hospital Problems   Diagnosis Date Noted  . Chest pain 10/01/2019  . Elevated troponin 10/02/2019  . Hypothyroidism 04/18/2018  . AKI (acute kidney injury) ([place]) 10/26/2013  . Diabetes mellitus with renal manifestation ([place]) 01/21/2013  . Chronic kidney disease (CKD), stage III (moderate) (HCC)   . Asthma 08/26/2009  . Obesity (BMI 30-39.9) 08/26/2009  . CAD (coronary artery disease) 03/31/2007  . Essential hypertension 03/31/2007    Resolved Hospital Problems  No resolved problems to display.    Discharge Condition: Improved, being discharged home  Diet recommendation: Heart healthy  Vitals:   10/03/19 0742 10/03/19 0913  BP:  (!) 146/84  Pulse:  64  Resp:    Temp:    SpO2: 97%     History of present illness:  64 year old female with past medical history of obesity, CAD status post CABG 15 years ago with multiple PCI's since then possible non-STEMI in July 2019 as well as stage III chronic kidney disease and diabetes mellitus who presented with chest pain.  In the emergency room, found to have mildly elevated troponin and states that she has been taking her aspirin and Plavix.  She states that time she has missed doses of hydralazine.  Also on admission, patient's creatinine elevated from baseline around 2.2 up to 3.02.  Following admission, repeat troponins much more elevated, going from 16 on admission up to 104 within 8 hours  Hospital Course:  Principal  Problem:   Chest pain in the setting of elevated troponins and extensive CAD history: Seen by cardiology.  With her elevated creatinine, limited options for cath.    Echocardiogram unrevealing.  By 1/15, creatinine down to 2.67.  Her troponin had actually also trended down to 66.  In discussion with cardiology, they felt that elevated troponin may be more likely related to her respiratory bronchitis I did not feel that she would need cardiac intervention.  They recommended continuing her home medications   Bronchitis: Likely cause of patient's cough and chest pain.  Her lung exam is clear and she has no fever.  Normal white count.  Would recommend she complete the course of antibiotics and steroids that were started prior to her hospitalization though.  active Problems:   Obesity (BMI 30-39.9): Meets criteria for BMI greater than 30.  Malignant hypertension: It is possible some of this could be secondary to fluid retention.    Patient's blood pressure much improved by increasing her Imdur and Norvasc.  Stressed the importance of compliance with her hydralazine which she says she will do.    Asthma: Stable during his hospitalization.  AKI in the setting of chronic kidney disease (CKD), stage III (moderate) ([place]): See above.  By following day, down to 2.8.  Much improved, closer to baseline by day of discharge.  At 2.67 this morning and patient continued to get IV fluids until time of discharge.  When she follows with her PCP, repeat basic metabolic panel done at that time.    Diabetes  mellitus with renal manifestation (HCC)/stage III chronic kidney disease: A1c notes good control at 6.8    Hypothyroidism: Continue Synthroid  Consultants:  Cardiology  Procedures:  Echocardiogram done on 1/14: Preserved ejection fraction.  Indeterminant diastolic dysfunction  Discharge Exam: BP (!) 146/84 (BP Location: Left Wrist)   Pulse 64   Temp 98.3 F (36.8 C) (Oral)   Resp 18   Ht 5\' 6"  (1.676  m)   Wt 93 kg   SpO2 97%   BMI 33.09 kg/m   General: Alert and oriented x3, no acute distress Cardiovascular: Regular rate and rhythm, S1-S2 Respiratory: Clear to auscultation bilaterally  Discharge Instructions You were cared for by a hospitalist during your hospital stay. If you have any questions about your discharge medications or the care you received while you were in the hospital after you are discharged, you can call the unit and asked to speak with the hospitalist on call if the hospitalist that took care of you is not available. Once you are discharged, your primary care physician will handle any further medical issues. Please note that NO REFILLS for any discharge medications will be authorized once you are discharged, as it is imperative that you return to your primary care physician (or establish a relationship with a primary care physician if you do not have one) for your aftercare needs so that they can reassess your need for medications and monitor your lab values.  Discharge Instructions    Diet - low sodium heart healthy   Complete by: As directed    Increase activity slowly   Complete by: As directed      Allergies as of 10/03/2019      Reactions   Penicillins Other (See Comments)   REACTION: Unknown, told as a child Has patient had a PCN reaction causing immediate rash, facial/tongue/throat swelling, SOB or lightheadedness with hypotension: Unknown Has patient had a PCN reaction causing severe rash involving mucus membranes or skin necrosis: Unknown Has patient had a PCN reaction that required hospitalization: Unknown Has patient had a PCN reaction occurring within the last 10 years: No If all of the above answers are "NO", then may proceed with Cephalosporin use.      Medication List    TAKE these medications   ALPRAZolam 0.5 MG tablet Commonly known as: XANAX Take 0.25-0.5 mg by mouth See admin instructions. Take 1/2 tab by mouth every morning & evening and 1  tab at bedtime   amLODipine 10 MG tablet Commonly known as: NORVASC Take 10 mg by mouth daily.   aspirin EC 81 MG tablet Take 81 mg by mouth every morning.   atorvastatin 80 MG tablet Commonly known as: LIPITOR Take 1 tablet (80 mg total) by mouth daily. What changed: when to take this   clopidogrel 75 MG tablet Commonly known as: PLAVIX Take 1 tablet (75 mg total) by mouth daily.   colchicine 0.6 MG tablet Take 0.6 mg by mouth daily.   doxycycline 100 MG tablet Commonly known as: VIBRA-TABS Take 100 mg by mouth 2 (two) times daily. 7 day course starting on 09/29/2019   gabapentin 400 MG capsule Commonly known as: NEURONTIN Take 400 mg by mouth 2 (two) times daily.   hydrALAZINE 50 MG tablet Commonly known as: APRESOLINE Take 75 mg by mouth 3 (three) times daily.   isosorbide dinitrate 5 MG tablet Commonly known as: ISORDIL Take 5 mg by mouth 2 (two) times daily.   levothyroxine 125 MCG tablet Commonly known as: SYNTHROID  Take 125 mcg by mouth every morning.   nitroGLYCERIN 0.4 MG SL tablet Commonly known as: NITROSTAT Place 0.4 mg under the tongue every 5 (five) minutes as needed for chest pain. Not to exceed 3 in 15 minute time frame   NovoLIN 70/30 ReliOn (70-30) 100 UNIT/ML injection Generic drug: insulin NPH-regular Human Inject 24 Units into the skin 2 (two) times daily with a meal.   ondansetron 8 MG disintegrating tablet Commonly known as: Zofran ODT Take 1 tablet (8 mg total) by mouth every 8 (eight) hours as needed for nausea or vomiting.   predniSONE 20 MG tablet Commonly known as: DELTASONE Take 40 mg by mouth daily with breakfast. 4 day course starting on 09/29/2019   Ventolin HFA 108 (90 Base) MCG/ACT inhaler Generic drug: albuterol Inhale 2 puffs into the lungs every 6 (six) hours as needed for wheezing or shortness of breath.   Virtussin A/C 100-10 MG/5ML syrup Generic drug: guaiFENesin-codeine Take 5 mLs by mouth 3 (three) times daily as  needed for cough.      Allergies  Allergen Reactions  . Penicillins Other (See Comments)    REACTION: Unknown, told as a child Has patient had a PCN reaction causing immediate rash, facial/tongue/throat swelling, SOB or lightheadedness with hypotension: Unknown Has patient had a PCN reaction causing severe rash involving mucus membranes or skin necrosis: Unknown Has patient had a PCN reaction that required hospitalization: Unknown Has patient had a PCN reaction occurring within the last 10 years: No If all of the above answers are "NO", then may proceed with Cephalosporin use.    Follow-up Information    Rory Percy, MD. Go on 10/28/2019.   Specialty: Family Medicine Why: As scheduled Contact information: [place] [address] [phone]        Satira Sark, MD .   Specialty: Cardiology Contact information: [place] [address] [phone]            The results of significant diagnostics from this hospitalization (including imaging, microbiology, ancillary and laboratory) are listed below for reference.    Significant Diagnostic Studies: US RENAL  Result Date: 10/02/2019 CLINICAL DATA:  Renal failure on admission today. History of RIGHT renal cyst. EXAM: RENAL / URINARY TRACT ULTRASOUND COMPLETE COMPARISON:  08/14/2015 CT FINDINGS: Right Kidney: Renal measurements: 12.2 x 4.9 x 5.47 = volume: 168.8 mL. LOWER pole cyst is 3.7 x 2.3 x 2.6 centimeters. No hydronephrosis. Increased renal echogenicity. Left Kidney: Renal measurements: 10.3 x 4.3 x 0.3 centimeter = volume: Is 98.2 mL. No hydronephrosis. Increased renal echogenicity. Bladder: Appears normal for degree of bladder distention. Other: None. IMPRESSION: 1. Increased renal echogenicity bilaterally. 2. Simple cyst in LOWER pole of the RIGHT kidney. 3. No hydronephrosis. Electronically Signed   By: Nolon Nations M.D.   On: 10/02/2019 09:01   DG Chest Port 1 View  Result Date:  10/01/2019 CLINICAL DATA:  Cough and chest pain EXAM: PORTABLE CHEST 1 VIEW COMPARISON:  04/18/2018 FINDINGS: Post sternotomy changes. Mild cardiomegaly. No focal airspace disease, pleural effusion or pneumothorax. IMPRESSION: No active disease.  Mild cardiomegaly Electronically Signed   By: Donavan Foil M.D.   On: 10/01/2019 21:23   ECHOCARDIOGRAM COMPLETE  Result Date: 10/02/2019   ECHOCARDIOGRAM REPORT   Patient Name:   Donna Howe Date of Exam: 10/02/2019 Medical Rec #:  [id number]        Height:       66.0 in Accession #:  [id number]       Weight:       205.0 lb Date of Birth:  June 17, 1956       BSA:          2.02 m Patient Age:    89 years         BP:           180/65 mmHg Patient Gender: F                HR:           64 bpm. Exam Location:  [facility] Procedure: 2D Echo Indications:    chest pain 786.50  History:        Patient has prior history of Echocardiogram examinations, most                 recent 04/05/2018. Previous Myocardial Infarction and CAD, Prior                 CABG; Risk Factors:Diabetes, Dyslipidemia and Hypertension.  Sonographer:    Jannett Celestine RDCS (AE) Referring Phys: [id number] [place]  1. Left ventricular ejection fraction, by visual estimation, is 60 to 65%. The left ventricle has normal function. There is mildly increased left ventricular hypertrophy.  2. Left ventricular diastolic parameters are indeterminate.  3. The left ventricle has no regional wall motion abnormalities.  4. Global right ventricle has normal systolic function.The right ventricular size is normal. No increase in right ventricular wall thickness.  5. Left atrial size was normal.  6. Right atrial size was normal.  7. The mitral valve is normal in structure. Trivial mitral valve regurgitation. No evidence of mitral stenosis.  8. The tricuspid valve is not well visualized.  9. The aortic valve is normal in structure. Aortic valve regurgitation is not visualized. No evidence of aortic  valve sclerosis or stenosis. 10. The pulmonic valve was not well visualized. Pulmonic valve regurgitation is not visualized. FINDINGS  Left Ventricle: Left ventricular ejection fraction, by visual estimation, is 60 to 65%. The left ventricle has normal function. The left ventricle has no regional wall motion abnormalities. There is mildly increased left ventricular hypertrophy. Left ventricular diastolic parameters are indeterminate. Right Ventricle: The right ventricular size is normal. No increase in right ventricular wall thickness. Global RV systolic function is has normal systolic function. Left Atrium: Left atrial size was normal in size. Right Atrium: Right atrial size was normal in size Pericardium: There is no evidence of pericardial effusion. Mitral Valve: The mitral valve is normal in structure. Trivial mitral valve regurgitation. No evidence of mitral valve stenosis by observation. Tricuspid Valve: The tricuspid valve is not well visualized. Tricuspid valve regurgitation is not demonstrated. Aortic Valve: The aortic valve is normal in structure. Aortic valve regurgitation is not visualized. The aortic valve is structurally normal, with no evidence of sclerosis or stenosis. Aortic valve mean gradient measures 7.9 mmHg. Aortic valve peak gradient measures 13.8 mmHg. Aortic valve area, by VTI measures 1.35 cm. Pulmonic Valve: The pulmonic valve was not well visualized. Pulmonic valve regurgitation is not visualized. Pulmonic regurgitation is not visualized. No evidence of pulmonic stenosis. Aorta: The aortic root is normal in size and structure. Pulmonary Artery: Indeterminant PASP, inadequate TR jet. IAS/Shunts: No atrial level shunt detected by color flow Doppler.  LEFT VENTRICLE PLAX 2D LVIDd:         4.82 cm  Diastology LVIDs:         2.98 cm  LV e'  lateral:   10.90 cm/s LV PW:         1.10 cm  LV E/e' lateral: 12.3 LV IVS:        1.12 cm  LV e' medial:    4.35 cm/s LVOT diam:     1.80 cm  LV E/e'  medial:  30.8 LV SV:         74 ml LV SV Index:   35.05 LVOT Area:     2.54 cm  RIGHT VENTRICLE RV S prime:     7.72 cm/s LEFT ATRIUM           Index LA diam:      3.30 cm 1.63 cm/m LA Vol (A2C): 40.3 ml 19.94 ml/m  AORTIC VALVE AV Area (Vmax):    1.56 cm AV Area (Vmean):   1.57 cm AV Area (VTI):     1.35 cm AV Vmax:           186.05 cm/s AV Vmean:          131.952 cm/s AV VTI:            0.454 m AV Peak Grad:      13.8 mmHg AV Mean Grad:      7.9 mmHg LVOT Vmax:         114.00 cm/s LVOT Vmean:        81.600 cm/s LVOT VTI:          0.240 m LVOT/AV VTI ratio: 0.53  AORTA Ao Root diam: 3.00 cm MITRAL VALVE MV Area (PHT): 2.66 cm              SHUNTS MV PHT:        82.65 msec            Systemic VTI:  0.24 m MV Decel Time: 285 msec              Systemic Diam: 1.80 cm MV E velocity: 134.00 cm/s 103 cm/s MV A velocity: 124.00 cm/s 70.3 cm/s MV E/A ratio:  1.08        1.5  Carlyle Dolly MD Electronically signed by Carlyle Dolly MD Signature Date/Time: 10/02/2019/4:48:31 PM    Final     Microbiology: Recent Results (from the past 240 hour(s))  Respiratory Panel by RT PCR (Flu A&B, Covid) - Nasopharyngeal Swab     Status: None   Collection Time: 10/01/19  8:57 PM   Specimen: Nasopharyngeal Swab  Result Value Ref Range Status   SARS Coronavirus 2 by RT PCR NEGATIVE NEGATIVE Final    Comment: (NOTE) SARS-CoV-2 target nucleic acids are NOT DETECTED. The SARS-CoV-2 RNA is generally detectable in upper respiratoy specimens during the acute phase of infection. The lowest concentration of SARS-CoV-2 viral copies this assay can detect is 131 copies/mL. A negative result does not preclude SARS-Cov-2 infection and should not be used as the sole basis for treatment or other patient management decisions. A negative result may occur with  improper specimen collection/handling, submission of specimen other than nasopharyngeal swab, presence of viral mutation(s) within the areas targeted by this assay, and  inadequate number of viral copies (<131 copies/mL). A negative result must be combined with clinical observations, patient history, and epidemiological information. The expected result is Negative. Fact Sheet for Patients:  PinkCheek.be Fact Sheet for Healthcare Providers:  GravelBags.it This test is not yet ap proved or cleared by the [place] FDA and  has been authorized for detection and/or diagnosis of SARS-CoV-2 by FDA under  an Emergency Use Authorization (EUA). This EUA will remain  in effect (meaning this test can be used) for the duration of the COVID-19 declaration under Section 564(b)(1) of the Act, 21 U.S.C. section 360bbb-3(b)(1), unless the authorization is terminated or revoked sooner.    Influenza A by PCR NEGATIVE NEGATIVE Final   Influenza B by PCR NEGATIVE NEGATIVE Final    Comment: (NOTE) The Xpert Xpress SARS-CoV-2/FLU/RSV assay is intended as an aid in  the diagnosis of influenza from Nasopharyngeal swab specimens and  should not be used as a sole basis for treatment. Nasal washings and  aspirates are unacceptable for Xpert Xpress SARS-CoV-2/FLU/RSV  testing. Fact Sheet for Patients: PinkCheek.be Fact Sheet for Healthcare Providers: GravelBags.it This test is not yet approved or cleared by the [place] FDA and  has been authorized for detection and/or diagnosis of SARS-CoV-2 by  FDA under an Emergency Use Authorization (EUA). This EUA will remain  in effect (meaning this test can be used) for the duration of the  Covid-19 declaration under Section 564(b)(1) of the Act, 21  U.S.C. section 360bbb-3(b)(1), unless the authorization is  terminated or revoked. Performed at [facility], [address] [place], [place] [address]   Urine culture     Status: None   Collection Time: 10/01/19  9:57 PM   Specimen: Urine, Clean Catch  Result Value  Ref Range Status   Specimen Description   Final    URINE, CLEAN CATCH Performed at [facility], [address] [place], [place] [address]    Special Requests   Final    NONE Performed at [facility], [address], [place], [place] [address]    Culture   Final    NO GROWTH Performed at [facility], [place] [address] [place], [place] [address]    Report Status 10/03/2019 FINAL  Final     Labs: Basic Metabolic Panel: Recent Labs  Lab 10/01/19 2056 10/02/19 0425 10/03/19 0616  NA 132* 138 139  K 5.1 5.0 4.6  CL 105 109 113*  CO2 20* 20* 21*  GLUCOSE 316* 152* 91  BUN 62* 65* 64*  CREATININE 3.02* 2.82* 2.67*  CALCIUM 8.6* 8.8* 8.1*   Liver Function Tests: Recent Labs  Lab 10/01/19 2056 10/02/19 0425  AST 24 22  ALT 32 29  ALKPHOS 170* 141*  BILITOT 0.4 0.3  PROT 7.2 6.3*  ALBUMIN 3.5 3.1*   No results for input(s): LIPASE, AMYLASE in the last 168 hours. No results for input(s): AMMONIA in the last 168 hours. CBC: Recent Labs  Lab 10/01/19 2056 10/02/19 0425 10/03/19 0616  WBC 13.7* 13.4* 8.7  NEUTROABS 12.1*  --   --   HGB 12.6 11.5* 10.8*  HCT 39.5 36.6 34.1*  MCV 92.3 91.7 93.9  PLT 198 187 157   Cardiac Enzymes: No results for input(s): CKTOTAL, CKMB, CKMBINDEX, TROPONINI in the last 168 hours. BNP: BNP (last 3 results) No results for input(s): BNP in the last 8760 hours.  ProBNP (last 3 results) No results for input(s): PROBNP in the last 8760 hours.  CBG: Recent Labs  Lab 10/02/19 1632 10/02/19 2302 10/03/19 0436 10/03/19 0749 10/03/19 1112  GLUCAP 144* 238* 73 98 94       Signed:  Annita Brod, MD [facility] 10/03/2019, 12:05 PM

## 2019-10-03 NOTE — Progress Notes (Signed)
ANTICOAGULATION CONSULT NOTE -  Pharmacy Consult for heparin dosing Indication:  Chest pain/ACS  Allergies  Allergen Reactions  . Penicillins Other (See Comments)    REACTION: Unknown, told as a child Has patient had a PCN reaction causing immediate rash, facial/tongue/throat swelling, SOB or lightheadedness with hypotension: Unknown Has patient had a PCN reaction causing severe rash involving mucus membranes or skin necrosis: Unknown Has patient had a PCN reaction that required hospitalization: Unknown Has patient had a PCN reaction occurring within the last 10 years: No If all of the above answers are "NO", then may proceed with Cephalosporin use.     Patient Measurements: Height: 5\' 6"  (167.6 cm) Weight: 205 lb 0.4 oz (93 kg) IBW/kg (Calculated) : 59.3 Heparin Dosing Weight: HEPARIN DW (KG): 79.8 HEPARIN DW (KG): 79.8 HEPARIN DW (KG): 79.8   Vital Signs: Temp: 98.3 F (36.8 C) (01/15 0538) Temp Source: Oral (01/15 0538) BP: 141/77 (01/15 0538) Pulse Rate: 65 (01/15 0538)  Labs: Recent Labs    10/01/19 2056 10/01/19 2056 10/01/19 2300 10/02/19 0425 10/02/19 0426 10/02/19 0607 10/02/19 0607 10/02/19 1341 10/02/19 2307 10/03/19 0616  HGB 12.6   < >  --  11.5*  --   --   --   --   --  10.8*  HCT 39.5  --   --  36.6  --   --   --   --   --  34.1*  PLT 198  --   --  187  --   --   --   --   --  157  LABPROT  --   --   --   --   --  13.3  --   --   --   --   INR  --   --   --   --   --  1.0  --   --   --   --   HEPARINUNFRC  --   --   --   --   --  0.12*   < > 0.16* 0.18* 0.61  CREATININE 3.02*  --   --  2.82*  --   --   --   --   --  2.67*  TROPONINIHS 16   < > 36*  --  104*  --   --   --   --  66*   < > = values in this interval not displayed.    Estimated Creatinine Clearance: 25.4 mL/min (A) (by C-G formula based on SCr of 2.67 mg/dL (H)).    Assessment: Pharmacy consulted to dose heparin infusion for this 63 yo female with chest pain and  rising Troponins.  She has a history of CAD, CKD and HTN. . Baseline CBC is normal.   Heparin level:  0.61 IU/mL,  therapeutic goal range   Goal of Therapy:  Heparin level 0.3-0.7 units/ml Monitor platelets by anticoagulation protocol: Yes   Plan:  Continue heparin infusion at 1450 units/hr Check confirmatory anti-Xa level in 6-8 hours Daily heparin level Continue to monitor H&H and platelets   Margot Ables, PharmD Clinical Pharmacist 10/03/2019 7:50 AM

## 2019-10-03 NOTE — Progress Notes (Signed)
**Note De-identified Hendry Speas Obfuscation** EKG complete and placed in patient chart 

## 2019-10-06 ENCOUNTER — Encounter: Payer: Self-pay | Admitting: *Deleted

## 2019-10-06 ENCOUNTER — Other Ambulatory Visit: Payer: Self-pay | Admitting: *Deleted

## 2019-10-06 NOTE — Patient Outreach (Signed)
[facility]) Care Management Cut Off Telephone Outreach [place] Red Alert- General Discharge Post-Hospital discharge day # 3 Unsuccessful Telephone Outreach attempt # 1- new referral  10/06/2019  [name] Aug 21, 1956 [id number]  EMMI Red Alert Notification: General Discharge EMMI call date/ day:  Sunday October 05, 2019; day 4 EMMI Red reason:  "No scheduled follow up"  Unsuccessful telephone outreach to Donna Howe, 64 y/o female referred to [place] earlier today by [facility] CMA after EMMI Red Alert received; patient was recently hospitalized January 13-15, 2021 for chest pain; she was discharged to home/ self-care without home health services in place.  Patient has history including, but not limited to, CAD with previous CABG/ PCI; DM- II; CKD- III; HTN; and obesity.  HIPAA compliant voice mail message left for patient, requesting return call back.  Plan:  Will place [facility] CM unsuccessful patient outreach letter in mail requesting call back in writing  Will re-attempt [place] telephone outreach within 4 business days if I do not hear back from patient first  Oneta Rack, RN, BSN, [employer] [facility] Care Management  [phone]

## 2019-10-07 DIAGNOSIS — Z20828 Contact with and (suspected) exposure to other viral communicable diseases: Secondary | ICD-10-CM | POA: Diagnosis not present

## 2019-10-07 NOTE — Progress Notes (Signed)
Virtual Visit via Telephone Note   This visit type was conducted due to national recommendations for restrictions regarding the COVID-19 Pandemic (e.g. social distancing) in an effort to limit this patient's exposure and mitigate transmission in our community.  Due to her co-morbid illnesses, this patient is at least at moderate risk for complications without adequate follow up.  This format is felt to be most appropriate for this patient at this time.  The patient did not have access to video technology/had technical difficulties with video requiring transitioning to audio format only (telephone).  All issues noted in this document were discussed and addressed.  No physical exam could be performed with this format.  Please refer to the patient's chart for her  consent to telehealth for [facility].   Date:  10/08/2019   ID:  Donna Howe, DOB 1955-12-21, MRN [id number]  Patient Location: Home Provider Location: Office  PCP:  Rory Percy, MD  Cardiologist:  Rozann Lesches, MD Electrophysiologist:  None   Evaluation Performed:  Follow-Up Visit  Chief Complaint:  Cardiac follow-up  History of Present Illness:    Donna Howe is a 64 y.o. female last assessed via telehealth encounter in November 2020.  Record review finds recent hospitalization with cough and chest discomfort.  She was diagnosed with pneumonia, also found to have acute on chronic renal insufficiency with creatinine up to 3.02, peak high-sensitivity troponin I of 104, nonspecific ECG.  She was seen in consultation with Dr. Harl Bowie with plan for medical therapy in terms of known history of ischemic heart disease.  Follow-up echocardiogram showed normal LVEF of 60 to 65% without regional wall motion abnormalities.  She was treated with a course of antibiotics and steroids.  Renal function improved with IV fluids.  We spoke by phone today.  She states that she is feeling better, did see her PCP.  She follows with Dr.  Nadara Mustard early February with lab work.  She does not report any further chest pain, cough is resolving but she still has some chest congestion.  I reviewed her medications and reinforced compliance with antihypertensive regimen.  Past Medical History:  Diagnosis Date  . Anemia   . Asthma   . CAD (coronary artery disease)    Multivessel s/p CABG 2005, numerous PCIs since that time  . Carotid artery disease (HCC)    R CEA  . Chronic kidney disease (CKD), stage III (moderate)   . Essential hypertension   . Gout   . Hyperlipidemia   . Hypothyroidism   . Myocardial infarction ([place])   . PAD (peripheral artery disease) ([place])    Dr. Kellie Simmering  . S/P angioplasty with stent- DES to [facility] and to LIMA to LAD with DES 04/09/18.   04/10/2018  . SBO (small bowel obstruction) ([place]) 2011   Status post lysis of adhesions & hernia repair  . Sinus bradycardia   . Thrombocytopenia, unspecified ([place])   . Type 2 diabetes mellitus ([place])   . Umbilical hernia    Past Surgical History:  Procedure Laterality Date  . CESAREAN SECTION  1984  . CHOLECYSTECTOMY  2010  . CORONARY ARTERY BYPASS GRAFT  2005  . CORONARY BALLOON ANGIOPLASTY N/A 05/31/2017   Procedure: CORONARY BALLOON ANGIOPLASTY;  Surgeon: Jettie Booze, MD;  Location: [place] CV LAB;  Service: Cardiovascular;  Laterality: N/A;  . CORONARY STENT INTERVENTION N/A 05/31/2017   Procedure: CORONARY STENT INTERVENTION;  Surgeon: Jettie Booze, MD;  Location: [place] CV LAB;  Service:  Cardiovascular;  Laterality: N/A;  . CORONARY STENT INTERVENTION N/A 04/09/2018   Procedure: CORONARY STENT INTERVENTION;  Surgeon: Jettie Booze, MD;  Location: [place] CV LAB;  Service: Cardiovascular;  Laterality: N/A;  SVG RCA  . ENDARTERECTOMY Right 04/18/2013   Procedure: ENDARTERECTOMY CAROTID;  Surgeon: Mal Misty, MD;  Location: [place];  Service: Vascular;  Laterality: Right;  . HERNIA REPAIR  1989  . Incisional hernia repair x2   03/04/2010   Laparoscopic with 35cm mesh by Dr Ronnald Collum  . LEFT HEART CATH AND CORS/GRAFTS ANGIOGRAPHY N/A 05/31/2017   Procedure: LEFT HEART CATH AND CORS/GRAFTS ANGIOGRAPHY;  Surgeon: Jettie Booze, MD;  Location: [place] CV LAB;  Service: Cardiovascular;  Laterality: N/A;  . LEFT HEART CATH AND CORS/GRAFTS ANGIOGRAPHY N/A 04/08/2018   Procedure: LEFT HEART CATH AND CORS/GRAFTS ANGIOGRAPHY;  Surgeon: Jettie Booze, MD;  Location: [place] CV LAB;  Service: Cardiovascular;  Laterality: N/A;  . LEFT HEART CATHETERIZATION WITH CORONARY ANGIOGRAM N/A 12/19/2012   Procedure: LEFT HEART CATHETERIZATION WITH CORONARY ANGIOGRAM;  Surgeon: Josue Hector, MD;  Location: [facility] CATH LAB;  Service: Cardiovascular;  Laterality: N/A;  . LEFT HEART CATHETERIZATION WITH CORONARY/GRAFT ANGIOGRAM N/A 04/19/2013   Procedure: LEFT HEART CATHETERIZATION WITH Beatrix Fetters;  Surgeon: Lorretta Harp, MD;  Location: [facility] CATH LAB;  Service: Cardiovascular;  Laterality: N/A;  . PATCH ANGIOPLASTY Right 04/18/2013   Procedure: PATCH ANGIOPLASTY Right Internal Carotid Artery;  Surgeon: Mal Misty, MD;  Location: [place];  Service: Vascular;  Laterality: Right;  . PERCUTANEOUS CORONARY STENT INTERVENTION (PCI-S) Right 12/19/2012   Procedure: PERCUTANEOUS CORONARY STENT INTERVENTION (PCI-S);  Surgeon: Josue Hector, MD;  Location: [facility] CATH LAB;  Service: Cardiovascular;  Laterality: Right;  . SHOULDER SURGERY       Current Meds  Medication Sig  . ALPRAZolam (XANAX) 0.5 MG tablet Take 0.25-0.5 mg by mouth See admin instructions. Take 1/2 tab by mouth every morning & evening and 1 tab at bedtime   . amLODipine (NORVASC) 10 MG tablet Take 10 mg by mouth daily.   Marland Kitchen aspirin EC 81 MG tablet Take 81 mg by mouth every morning.   Marland Kitchen atorvastatin (LIPITOR) 80 MG tablet Take 1 tablet (80 mg total) by mouth daily. (Patient taking differently: Take 80 mg by mouth every evening. )  . clopidogrel (PLAVIX) 75 MG  tablet Take 1 tablet (75 mg total) by mouth daily.  . colchicine 0.6 MG tablet Take 0.6 mg by mouth daily.   Marland Kitchen gabapentin (NEURONTIN) 400 MG capsule Take 400 mg by mouth 2 (two) times daily.   . hydrALAZINE (APRESOLINE) 50 MG tablet Take 75 mg by mouth 3 (three) times daily.  . isosorbide dinitrate (ISORDIL) 5 MG tablet Take 5 mg by mouth 2 (two) times daily.  Marland Kitchen levothyroxine (SYNTHROID, LEVOTHROID) 125 MCG tablet Take 125 mcg by mouth every morning.  . nitroGLYCERIN (NITROSTAT) 0.4 MG SL tablet Place 0.4 mg under the tongue every 5 (five) minutes as needed for chest pain. Not to exceed 3 in 15 minute time frame  . NOVOLIN 70/30 RELION (70-30) 100 UNIT/ML injection Inject 24 Units into the skin 2 (two) times daily with a meal.   . ondansetron (ZOFRAN ODT) 8 MG disintegrating tablet Take 1 tablet (8 mg total) by mouth every 8 (eight) hours as needed for nausea or vomiting.  . SYMBICORT 160-4.5 MCG/ACT inhaler Inhale 2 puffs into the lungs 2 (two) times daily.     Allergies:  Penicillins   Social History   Tobacco Use  . Smoking status: Former Smoker    Packs/day: 1.00    Years: 20.00    Pack years: 20.00    Types: Cigarettes    Quit date: 12/10/2012    Years since quitting: 6.8  . Smokeless tobacco: Never Used  Substance Use Topics  . Alcohol use: No    Alcohol/week: 0.0 standard drinks  . Drug use: No     Family Hx: The patient's family history includes AAA (abdominal aortic aneurysm) in her father; Diabetes in her mother; Heart disease in her brother and mother; Hyperlipidemia in her mother and son; Hypertension in her brother, father, mother, and son; Thyroid disease in her father.  ROS:   Please see the history of present illness. All other systems reviewed and are negative.   Prior CV studies:   The following studies were reviewed today:  Echocardiogram 10/02/2019:  1. Left ventricular ejection fraction, by visual estimation, is 60 to 65%. The left ventricle has normal  function. There is mildly increased left ventricular hypertrophy.  2. Left ventricular diastolic parameters are indeterminate.  3. The left ventricle has no regional wall motion abnormalities.  4. Global right ventricle has normal systolic function.The right ventricular size is normal. No increase in right ventricular wall thickness.  5. Left atrial size was normal.  6. Right atrial size was normal.  7. The mitral valve is normal in structure. Trivial mitral valve regurgitation. No evidence of mitral stenosis.  8. The tricuspid valve is not well visualized.  9. The aortic valve is normal in structure. Aortic valve regurgitation is not visualized. No evidence of aortic valve sclerosis or stenosis. 10. The pulmonic valve was not well visualized. Pulmonic valve regurgitation is not visualized.  Labs/Other Tests and Data Reviewed:    EKG:  An ECG dated 10/03/2019 was personally reviewed today and demonstrated:  Sinus rhythm with nonspecific T wave changes.  Recent Labs: 10/02/2019: ALT 29 10/03/2019: BUN 64; Creatinine, Ser 2.67; Hemoglobin 10.8; Platelets 157; Potassium 4.6; Sodium 139   Recent Lipid Panel Lab Results  Component Value Date/Time   CHOL 160 10/02/2019 04:25 AM   TRIG 186 (H) 10/02/2019 04:25 AM   HDL 44 10/02/2019 04:25 AM   CHOLHDL 3.6 10/02/2019 04:25 AM   LDLCALC 79 10/02/2019 04:25 AM    Wt Readings from Last 3 Encounters:  10/08/19 210 lb (95.3 kg)  10/02/19 205 lb 0.4 oz (93 kg)  07/21/19 205 lb (93 kg)     Objective:    Vital Signs:  BP (!) 152/72   Pulse 78   Ht 5\' 6"  (1.676 m)   Wt 210 lb (95.3 kg)   BMI 33.89 kg/m    Patient spoke in full sentences, not obviously short of breath. Occasional cough noted, no audible wheezing. Speech pattern normal.  ASSESSMENT & PLAN:    1.  CAD status post CABG and subsequent DES to the mid RCA as well as DES to the LIMA to LAD in 2019.  Recent admission with chest pain noted in the setting of bronchitis with  high-sensitivity troponin I level more suggestive of demand ischemia than ACS.  Echocardiogram shows normal LVEF at 60 to 65% without regional wall motion abnormalities.  She is feeling better and plan will be to continue medical therapy and observation for now.  Continue aspirin, Plavix, Lipitor, hydralazine, Isordil, and as needed nitroglycerin.  2.  Mixed hyperlipidemia, she remains on Lipitor.  Keep follow-up with Dr. Nadara Mustard  for repeat lab work.  Recent LDL 79.  3.  Essential hypertension.  No changes in present medications.  4.  CKD stage IIIb with recent acute renal insufficiency.  Follow-up lab work with Dr. Nadara Mustard.  She is not on ACE inhibitor or ARB.  COVID-19 Education: The signs and symptoms of COVID-19 were discussed with the patient and how to seek care for testing (follow up with PCP or arrange E-visit).  The importance of social distancing was discussed today.  Time:   Today, I have spent 5 minutes with the patient with telehealth technology discussing the above problems.     Medication Adjustments/Labs and Tests Ordered: Current medicines are reviewed at length with the patient today.  Concerns regarding medicines are outlined above.   Tests Ordered: No orders of the defined types were placed in this encounter.   Medication Changes: No orders of the defined types were placed in this encounter.   Follow Up:  In Person 6 months in the [place] office.  Signed, Rozann Lesches, MD  10/08/2019 2:12 PM    [facility]

## 2019-10-08 ENCOUNTER — Encounter: Payer: Self-pay | Admitting: Cardiology

## 2019-10-08 ENCOUNTER — Telehealth (INDEPENDENT_AMBULATORY_CARE_PROVIDER_SITE_OTHER): Payer: Medicare HMO | Admitting: Cardiology

## 2019-10-08 VITALS — BP 152/72 | HR 78 | Ht 66.0 in | Wt 210.0 lb

## 2019-10-08 DIAGNOSIS — E1159 Type 2 diabetes mellitus with other circulatory complications: Secondary | ICD-10-CM | POA: Diagnosis not present

## 2019-10-08 DIAGNOSIS — R69 Illness, unspecified: Secondary | ICD-10-CM | POA: Diagnosis not present

## 2019-10-08 DIAGNOSIS — I25119 Atherosclerotic heart disease of native coronary artery with unspecified angina pectoris: Secondary | ICD-10-CM

## 2019-10-08 DIAGNOSIS — I10 Essential (primary) hypertension: Secondary | ICD-10-CM | POA: Diagnosis not present

## 2019-10-08 DIAGNOSIS — Z008 Encounter for other general examination: Secondary | ICD-10-CM | POA: Diagnosis not present

## 2019-10-08 DIAGNOSIS — Z794 Long term (current) use of insulin: Secondary | ICD-10-CM | POA: Diagnosis not present

## 2019-10-08 DIAGNOSIS — E1151 Type 2 diabetes mellitus with diabetic peripheral angiopathy without gangrene: Secondary | ICD-10-CM | POA: Diagnosis not present

## 2019-10-08 DIAGNOSIS — E1142 Type 2 diabetes mellitus with diabetic polyneuropathy: Secondary | ICD-10-CM | POA: Diagnosis not present

## 2019-10-08 DIAGNOSIS — E039 Hypothyroidism, unspecified: Secondary | ICD-10-CM | POA: Diagnosis not present

## 2019-10-08 DIAGNOSIS — N1832 Chronic kidney disease, stage 3b: Secondary | ICD-10-CM

## 2019-10-08 DIAGNOSIS — E782 Mixed hyperlipidemia: Secondary | ICD-10-CM | POA: Diagnosis not present

## 2019-10-08 DIAGNOSIS — E1122 Type 2 diabetes mellitus with diabetic chronic kidney disease: Secondary | ICD-10-CM | POA: Diagnosis not present

## 2019-10-08 DIAGNOSIS — J449 Chronic obstructive pulmonary disease, unspecified: Secondary | ICD-10-CM | POA: Diagnosis not present

## 2019-10-08 NOTE — Patient Instructions (Addendum)

## 2019-10-10 ENCOUNTER — Encounter: Payer: Self-pay | Admitting: *Deleted

## 2019-10-10 ENCOUNTER — Other Ambulatory Visit: Payer: Self-pay | Admitting: *Deleted

## 2019-10-10 NOTE — Patient Outreach (Signed)
[facility]) Care Management [place], [facility] red-Alert/ General Discharge Unsuccessful (consecutive) outreach attempt # 2  10/10/2019  Atha Muradyan Halder March 05, 1956 [id number]  EMMI Red Alert Notification: General Discharge EMMI call date/ day:  Sunday October 05, 2019; day 4 EMMI Red reason:  "No scheduled follow up"  Unsuccessful second attempt telephone outreach to Donna Howe, 64 y/o female referred to [place] earlier today by [facility] CMA after EMMI Red Alert received; patient was recently hospitalized January 13-15, 2021 for chest pain; she was discharged to home/ self-care without home health services in place.  Patient has history including, but not limited to, CAD with previous CABG/ PCI; DM- II; CKD- III; HTN; and obesity.  HIPAA compliant voice mail message left for patient, requesting return call back.  Plan:  Verified [place] unsuccessful patient outreach letter in mail requesting call back in writing on October 06, 2019  Will re-attempt [place] telephone outreach within 4 business days if I do not hear back from patient first  Oneta Rack, RN, BSN, [employer] Coordinator [facility] Care Management  [phone]

## 2019-10-15 ENCOUNTER — Other Ambulatory Visit: Payer: Self-pay | Admitting: *Deleted

## 2019-10-15 ENCOUNTER — Encounter: Payer: Self-pay | Admitting: *Deleted

## 2019-10-15 NOTE — Patient Outreach (Signed)
[facility]) Care Management [place] Telephone Outreach, [place] Red Alert notification Post-hospital discharge day # 12 Unsuccessful (consecutive) third outreach attempt without patient call back  10/15/2019  Donna Howe June 30, 1956 [id number]  EMMI Red Alert Notification: General Discharge EMMI call date/ day: Sunday October 05, 2019; day 4 EMMI Red reason: "No scheduled follow up"  Unsuccessful (consecutive) third attempt telephone outreach to [name], 64 y/o female referred to [place] earlier today by [facility] CMA after EMMI Red Alert received; patient was recently hospitalized January 13-15, 2021 for chest pain; she was discharged to home/ self-care without home health services in place.Patient has history including, but not limited to, CAD with previous CABG/ PCI; DM- II; CKD- III; HTN; and obesity.  HIPAA compliant voice mail message left for patient, requesting return call back.  In message left for patient, made her aware that I am available for her should she need assistance post- hospital discharge; left my direct call back number  Plan:  Verified [place] unsuccessful patient outreach letter in mail requesting call back in writing on October 06, 2019  Will make patient inactive with [place] CM, as this is the third consecutive outreach attempt without patient response  Oneta Rack, RN, BSN, [employer] Coordinator [facility] Care Management  [phone]

## 2019-10-17 DIAGNOSIS — I10 Essential (primary) hypertension: Secondary | ICD-10-CM | POA: Diagnosis not present

## 2019-10-17 DIAGNOSIS — E7849 Other hyperlipidemia: Secondary | ICD-10-CM | POA: Diagnosis not present

## 2019-10-24 DIAGNOSIS — I10 Essential (primary) hypertension: Secondary | ICD-10-CM | POA: Diagnosis not present

## 2019-10-24 DIAGNOSIS — E039 Hypothyroidism, unspecified: Secondary | ICD-10-CM | POA: Diagnosis not present

## 2019-10-24 DIAGNOSIS — E1140 Type 2 diabetes mellitus with diabetic neuropathy, unspecified: Secondary | ICD-10-CM | POA: Diagnosis not present

## 2019-10-24 DIAGNOSIS — E782 Mixed hyperlipidemia: Secondary | ICD-10-CM | POA: Diagnosis not present

## 2019-10-24 DIAGNOSIS — E7800 Pure hypercholesterolemia, unspecified: Secondary | ICD-10-CM | POA: Diagnosis not present

## 2019-10-24 DIAGNOSIS — E1165 Type 2 diabetes mellitus with hyperglycemia: Secondary | ICD-10-CM | POA: Diagnosis not present

## 2019-10-28 DIAGNOSIS — R0989 Other specified symptoms and signs involving the circulatory and respiratory systems: Secondary | ICD-10-CM | POA: Diagnosis not present

## 2019-10-28 DIAGNOSIS — I739 Peripheral vascular disease, unspecified: Secondary | ICD-10-CM | POA: Diagnosis not present

## 2019-10-28 DIAGNOSIS — Z8679 Personal history of other diseases of the circulatory system: Secondary | ICD-10-CM | POA: Diagnosis not present

## 2019-10-28 DIAGNOSIS — I10 Essential (primary) hypertension: Secondary | ICD-10-CM | POA: Diagnosis not present

## 2019-10-28 DIAGNOSIS — Z6836 Body mass index (BMI) 36.0-36.9, adult: Secondary | ICD-10-CM | POA: Diagnosis not present

## 2019-10-28 DIAGNOSIS — Z0001 Encounter for general adult medical examination with abnormal findings: Secondary | ICD-10-CM | POA: Diagnosis not present

## 2019-10-28 DIAGNOSIS — N289 Disorder of kidney and ureter, unspecified: Secondary | ICD-10-CM | POA: Diagnosis not present

## 2019-11-02 DIAGNOSIS — J209 Acute bronchitis, unspecified: Secondary | ICD-10-CM | POA: Diagnosis not present

## 2019-11-12 DIAGNOSIS — N189 Chronic kidney disease, unspecified: Secondary | ICD-10-CM | POA: Diagnosis not present

## 2019-11-12 DIAGNOSIS — N184 Chronic kidney disease, stage 4 (severe): Secondary | ICD-10-CM | POA: Diagnosis not present

## 2019-11-12 DIAGNOSIS — N390 Urinary tract infection, site not specified: Secondary | ICD-10-CM | POA: Diagnosis not present

## 2019-11-12 DIAGNOSIS — D631 Anemia in chronic kidney disease: Secondary | ICD-10-CM | POA: Diagnosis not present

## 2019-11-12 DIAGNOSIS — I2510 Atherosclerotic heart disease of native coronary artery without angina pectoris: Secondary | ICD-10-CM | POA: Diagnosis not present

## 2019-11-12 DIAGNOSIS — N179 Acute kidney failure, unspecified: Secondary | ICD-10-CM | POA: Diagnosis not present

## 2019-11-12 DIAGNOSIS — I129 Hypertensive chronic kidney disease with stage 1 through stage 4 chronic kidney disease, or unspecified chronic kidney disease: Secondary | ICD-10-CM | POA: Diagnosis not present

## 2019-11-12 DIAGNOSIS — K219 Gastro-esophageal reflux disease without esophagitis: Secondary | ICD-10-CM | POA: Diagnosis not present

## 2019-11-12 DIAGNOSIS — E785 Hyperlipidemia, unspecified: Secondary | ICD-10-CM | POA: Diagnosis not present

## 2019-11-13 DIAGNOSIS — I10 Essential (primary) hypertension: Secondary | ICD-10-CM | POA: Diagnosis not present

## 2019-11-13 DIAGNOSIS — E7801 Familial hypercholesterolemia: Secondary | ICD-10-CM | POA: Diagnosis not present

## 2019-11-13 DIAGNOSIS — E039 Hypothyroidism, unspecified: Secondary | ICD-10-CM | POA: Diagnosis not present

## 2019-11-13 DIAGNOSIS — E1165 Type 2 diabetes mellitus with hyperglycemia: Secondary | ICD-10-CM | POA: Diagnosis not present

## 2019-11-14 ENCOUNTER — Other Ambulatory Visit (HOSPITAL_COMMUNITY): Payer: Self-pay | Admitting: Internal Medicine

## 2019-11-14 DIAGNOSIS — E039 Hypothyroidism, unspecified: Secondary | ICD-10-CM | POA: Diagnosis not present

## 2019-11-14 DIAGNOSIS — N184 Chronic kidney disease, stage 4 (severe): Secondary | ICD-10-CM

## 2019-11-14 DIAGNOSIS — D649 Anemia, unspecified: Secondary | ICD-10-CM

## 2019-11-14 DIAGNOSIS — N179 Acute kidney failure, unspecified: Secondary | ICD-10-CM

## 2019-11-14 DIAGNOSIS — I10 Essential (primary) hypertension: Secondary | ICD-10-CM | POA: Diagnosis not present

## 2019-11-14 DIAGNOSIS — E1165 Type 2 diabetes mellitus with hyperglycemia: Secondary | ICD-10-CM | POA: Diagnosis not present

## 2019-11-14 DIAGNOSIS — E7801 Familial hypercholesterolemia: Secondary | ICD-10-CM | POA: Diagnosis not present

## 2019-11-17 ENCOUNTER — Encounter (HOSPITAL_COMMUNITY): Payer: Self-pay | Admitting: Radiology

## 2019-11-17 NOTE — Progress Notes (Signed)
Donna Howe Female, 63 y.o., Jul 07, 1956 MRN:  [id number] Phone:  [phone] [name]) PCP:  Rory Percy, MD Coverage:  [place] [employer] Medicare Hmo/Ppo Next Appt With Pulmonology 11/20/2019 at 11:15 AM  RE: US Kidney Biopsy Received: 2 days ago Message Contents  Satira Sark, MD  Garth Bigness D  Patient may hold Plavix 5 days prior to kidney biopsy as requested. Her last DES intervention was in 2019.       Previous Messages   ----- Message -----  From: Garth Bigness D  Sent: 11/14/2019  3:01 PM EST  To: Satira Sark, MD, *  Subject: US Kidney Biopsy                 Dr. Domenic Polite has patient taking Plavix and Dr. Johnney Ou at [facility] has sent a request for patient to have a Biopsy done. I will need an okay from patients Cardiologist to hold per Plavix for 5 days prior to scheduling patient for her Biopsy. Please let me know if it is okay to hold blood thinner.   Thanks Aniceto Boss

## 2019-11-20 ENCOUNTER — Other Ambulatory Visit: Payer: Self-pay

## 2019-11-20 ENCOUNTER — Ambulatory Visit (INDEPENDENT_AMBULATORY_CARE_PROVIDER_SITE_OTHER): Payer: Medicare HMO

## 2019-11-20 ENCOUNTER — Ambulatory Visit: Payer: Medicare HMO | Admitting: Internal Medicine

## 2019-11-20 ENCOUNTER — Encounter: Payer: Self-pay | Admitting: Internal Medicine

## 2019-11-20 DIAGNOSIS — R05 Cough: Secondary | ICD-10-CM

## 2019-11-20 DIAGNOSIS — R058 Other specified cough: Secondary | ICD-10-CM

## 2019-11-20 DIAGNOSIS — J45991 Cough variant asthma: Secondary | ICD-10-CM | POA: Insufficient documentation

## 2019-11-20 DIAGNOSIS — R69 Illness, unspecified: Secondary | ICD-10-CM | POA: Diagnosis not present

## 2019-11-20 MED ORDER — FAMOTIDINE 20 MG PO TABS: ORAL_TABLET | ORAL | 11 refills | Status: DC

## 2019-11-20 MED ORDER — GABAPENTIN 400 MG PO CAPS: 400.0000 mg | ORAL_CAPSULE | Freq: Four times a day (QID) | ORAL | 2 refills | Status: DC

## 2019-11-20 MED ORDER — METHYLPREDNISOLONE ACETATE 80 MG/ML IJ SUSP
120.0000 mg | Freq: Once | INTRAMUSCULAR | Status: AC
  Administered 2019-11-20: 120 mg via INTRAMUSCULAR

## 2019-11-20 NOTE — Progress Notes (Signed)
Donna Howe, female    DOB: [birth year],    MRN: [id number]   Brief patient profile:  7 yowf quit smoking 2014 with "bronchitis" = wheezing/ cough/ sob sev times a year since her 68s seems to come on early in winter and goes away with usually   both pred/ abx and sometimes saba  but in Jan 2021 treated the same as before and got better x for cough / did not respond to symbicort then admitted [facility] for cp  Admit date: 10/01/2019 Discharge date: 10/03/2019     Recommendations for Outpatient Follow-up:  1. Prior to admission, patient had been put on a steroid taper as well as oral doxycycline.  Would recommend she actually complete both of these.   Discharge Diagnoses:      Active Hospital Problems   Diagnosis Date Noted  . Chest pain 10/01/2019  . Elevated troponin 10/02/2019  . Hypothyroidism 04/18/2018  . AKI (acute kidney injury) ([place]) 10/26/2013  . Diabetes mellitus with renal manifestation ([place]) 01/21/2013  . Chronic kidney disease (CKD), stage III (moderate) (HCC)   . Asthma 08/26/2009  . Obesity (BMI 30-39.9) 08/26/2009  . CAD (coronary artery disease) 03/31/2007  . Essential hypertension 03/31/2007       History of present illness:  64 year old female with past medical history of obesity, CAD status post CABG 15 years ago with multiple PCI's since then possible non-STEMI in July 2019 as well as stage III chronic kidney disease and diabetes mellitus who presented with chest pain. In the emergency room, found to have mildly elevated troponin and states that she has been taking her aspirin and Plavix. She states that time she has missed doses of hydralazine. Also on admission, patient's creatinine elevated from baseline around 2.2 up to 3.02.  Following admission, repeat troponins much more elevated,going from 16 on admission up to 104 within 8 hours  Hospital Course:  Principal Problem: Chest painin the setting of elevated troponins and extensive CAD  history: Seen by cardiology. With her elevated creatinine, limited options for cath.   Echocardiogram unrevealing.  By 1/15, creatinine down to 2.67.  Her troponin had actually also trended down to 66.  In discussion with cardiology, they felt that elevated troponin may be more likely related to her respiratory bronchitis I did not feel that she would need cardiac intervention.  They recommended continuing her home medications   Bronchitis: Likely cause of patient's cough and chest pain.  Her lung exam is clear and she has no fever.  Normal white count.  Would recommend she complete the course of antibiotics and steroids that were started prior to her hospitalization though.  active Problems: Obesity (BMI 30-39.9): Meets criteria for BMI greater than 30.  Malignanthypertension: It is possible some of this could be secondary to fluid retention.   Patient's blood pressure much improved by increasing her Imdur and Norvasc.  Stressed the importance of compliance with her hydralazine which she says she will do.     AKI in the setting of chronic kidney disease (CKD), stage III (moderate) ([place]): See above.  By following day, down to 2.8.  Much improved, closer to baseline by day of discharge.  At 2.67 this morning and patient continued to get IV fluids until time of discharge.  When she follows with her PCP, repeat basic metabolic panel done at that time.  Diabetes mellitus with renal manifestation (HCC)/stage III chronic kidney disease: A1c notes good control at 6.8  Hypothyroidism: Continue Synthroid  History of Present Illness  11/20/2019  Pulmonary/ 1st office eval/Donna Howe  Chief Complaint  Patient presents with  . Pulmonary Consult    Referred by Dr. Rory Howe. Pt c/o cough since Jan 2021. She states has hx of having bronchitis. Her cough is occ prod with white sputum.   Dyspnea:  MMRC3 = can't walk 100 yards even at a slow pace at a flat grade s stopping due to sob and  leg pain  Cough: sporadic / variably productive  Day > noct Sleep: on side/ bed is flat, 3 pillows SABA no better / also no better on prednisone per Dr Donna Howe note/ ? Some better on symbicort 160 Did not take ppi due to renal concerns   No obvious day to day or daytime variability or assoc excess/ purulent sputum or mucus plugs or hemoptysis or cp or chest tightness, subjective wheeze or overt sinus or hb symptoms.   Sleeping most nights  without nocturnal  or early am exacerbation  of respiratory  c/o's or need for noct saba. Also denies any obvious fluctuation of symptoms with weather or environmental changes or other aggravating or alleviating factors except as outlined above   No unusual exposure hx or h/o childhood pna/ asthma or knowledge of premature birth.  Current Allergies, Complete Past Medical History, Past Surgical History, Family History, and Social History were reviewed in [employer] record.  ROS  The following are not active complaints unless bolded Hoarseness, sore throat, dysphagia, dental problems, itching, sneezing,  nasal congestion or discharge of excess mucus or purulent secretions, ear ache,   fever, chills, sweats, unintended wt loss or wt gain, classically pleuritic or exertional cp,  orthopnea pnd or arm/hand swelling  or leg swelling, presyncope, palpitations, abdominal pain, anorexia, nausea, vomiting, diarrhea  or change in bowel habits or change in bladder habits, change in stools or change in urine, dysuria, hematuria,  rash, arthralgias, visual complaints, headache, numbness, weakness or ataxia or problems with walking or coordination,  change in mood or  memory.                  Past Medical History:  Diagnosis Date  . Anemia   . Asthma   . CAD (coronary artery disease)    Multivessel s/p CABG 2005, numerous PCIs since that time  . Carotid artery disease (HCC)    R CEA  . Chronic kidney disease (CKD), stage III (moderate)    . Essential hypertension   . Gout   . Hyperlipidemia   . Hypothyroidism   . Myocardial infarction ([place])   . PAD (peripheral artery disease) ([place])    Donna Howe  . S/P angioplasty with stent- DES to [facility] and to [facility] to LAD with DES 04/09/18.   04/10/2018  . SBO (small bowel obstruction) ([place]) 2011   Status post lysis of adhesions & hernia repair  . Sinus bradycardia   . Thrombocytopenia, unspecified ([place])   . Type 2 diabetes mellitus ([place])   . Umbilical hernia     Outpatient Medications Prior to Visit  Medication Sig Dispense Refill  . Albuterol Sulfate 108 (90 Base) MCG/ACT AEPB Inhale 2 puffs into the lungs every 4 (four) hours as needed.    . ALPRAZolam (XANAX) 0.5 MG tablet Take 0.25-0.5 mg by mouth See admin instructions. Take 1/2 tab by mouth every morning & evening and 1 tab at bedtime     . amLODipine (NORVASC) 10 MG tablet Take 10 mg by mouth daily.     Marland Kitchen  aspirin EC 81 MG tablet Take 81 mg by mouth every morning.     Marland Kitchen atorvastatin (LIPITOR) 80 MG tablet Take 1 tablet (80 mg total) by mouth daily. (Patient taking differently: Take 80 mg by mouth every evening. ) 30 tablet 0  . clopidogrel (PLAVIX) 75 MG tablet Take 1 tablet (75 mg total) by mouth daily. 90 tablet 3  . colchicine 0.6 MG tablet Take 0.6 mg by mouth daily.     Marland Kitchen gabapentin (NEURONTIN) 400 MG capsule Take 400 mg by mouth 2 (two) times daily.     . hydrALAZINE (APRESOLINE) 50 MG tablet Take 75 mg by mouth 3 (three) times daily.    . isosorbide dinitrate (ISORDIL) 5 MG tablet Take 5 mg by mouth 2 (two) times daily.    Marland Kitchen levothyroxine (SYNTHROID, LEVOTHROID) 125 MCG tablet Take 125 mcg by mouth every morning.    . nitroGLYCERIN (NITROSTAT) 0.4 MG SL tablet Place 0.4 mg under the tongue every 5 (five) minutes as needed for chest pain. Not to exceed 3 in 15 minute time frame    . NOVOLIN 70/30 RELION (70-30) 100 UNIT/ML injection Inject 24 Units into the skin 2 (two) times daily with a meal.     . ondansetron (ZOFRAN  ODT) 8 MG disintegrating tablet Take 1 tablet (8 mg total) by mouth every 8 (eight) hours as needed for nausea or vomiting. 20 tablet 0  . SYMBICORT 160-4.5 MCG/ACT inhaler Inhale 2 puffs into the lungs 2 (two) times daily.        Objective:     BP 136/68 (BP Location: Left Arm, Cuff Size: Normal)   Pulse (!) 59   Temp 97.6 F (36.4 C) (Temporal)   Ht 5\' 6"  (1.676 m)   Wt 217 lb (98.4 kg)   SpO2 96% Comment: on RA  BMI 35.02 kg/m   SpO2: 96 %(on RA)   amb wf nad   HEENT : pt wearing mask not removed for exam due to covid -19 concerns.    NECK :  without JVD/Nodes/TM/ nl carotid upstrokes bilaterally   LUNGS: no acc muscle use,  Nl contour chest which is clear to A and P bilaterally without cough on insp or exp maneuvers   CV:  RRR  no s3 or murmur or increase in P2, and no edema   ABD:  soft and nontender with nl inspiratory excursion in the supine position. No bruits or organomegaly appreciated, bowel sounds nl  MS:  Nl gait/ ext warm without deformities, calf tenderness, cyanosis or clubbing No obvious joint restrictions   SKIN: warm and dry without lesions    NEURO:  alert, approp, nl sensorium with  no motor or cerebellar deficits apparent.        CXR PA and Lateral:   11/20/2019 :    I personally reviewed images and agree with radiology impression as follows:   Pulmonary vascular prominence without overt pulmonary edema. No focal airspace opacity.       Assessment   Upper airway cough syndrome Onset Jan 2021  - increase gabapentin to 400 mg qid 11/20/2019 and add pepcid 20 mg bid  Of the three most common causes of  Sub-acute / recurrent or chronic cough, only one (GERD)  can actually contribute to/ trigger  the other two (asthma and post nasal drip syndrome)  and perpetuate the cylce of cough.  While not intuitively obvious, many patients with chronic low grade reflux do not cough until there is a primary insult  that disturbs the protective epithelial  barrier and exposes sensitive nerve endings.   This is typically viral but can due to PNDS and  either may apply here.     >>>the point is that once this occurs, it is difficult to eliminate the cycle  using anything but a maximally effective acid suppression regimen (which will need to include pepcid for now if ok with renal)   , accompanied by an appropriate diet to address non acid GERD and control / eliminate the cough itself with the use of gabapentin 400 mg qid and encourage use of non-mint/menthol hard rock candies to promote swallowing rather than throat clearing.    >>> f/u in 4 weeks with all meds in hand using a trust but verify approach to confirm accurate Medication  Reconciliation The principal here is that until we are certain that the  patients are doing what we've asked, it makes no sense to ask them to do more.          Each maintenance medication was reviewed in detail including emphasizing most importantly the difference between maintenance and prns and under what circumstances the prns are to be triggered using an action plan format where appropriate.  Total time for H and P, chart review,  and generating customized AVS unique to this office visit / charting = 45 min          Christinia Gully, MD 11/20/2019

## 2019-11-20 NOTE — Patient Instructions (Addendum)
Increase gabapentin to 400 mg four times a day until you stop coughing   Pepcid 20 mg after bfast and supper   GERD (REFLUX)  is an extremely common cause of respiratory symptoms just like yours , many times with no obvious heartburn at all.    It can be treated with medication, but also with lifestyle changes including elevation of the head of your bed (ideally with 6 -8inch blocks under the headboard of your bed),  Smoking cessation, avoidance of late meals, excessive alcohol, and avoid fatty foods, chocolate, peppermint, colas, red wine, and acidic juices such as orange juice.  NO MINT OR MENTHOL PRODUCTS SO NO COUGH DROPS  USE SUGARLESS CANDY INSTEAD (Jolley ranchers or Stover's or Life Savers) or even ice chips will also do - the key is to swallow to prevent all throat clearing. NO OIL BASED VITAMINS - use powdered substitutes.  Avoid fish oil when coughing.  Please remember to go to the  x-ray department  for your tests - we will call you with the results when they are available    Please schedule a follow up office visit in 4 weeks, sooner if needed

## 2019-11-21 ENCOUNTER — Encounter: Payer: Self-pay | Admitting: Internal Medicine

## 2019-11-21 NOTE — Assessment & Plan Note (Signed)
Onset Jan 2021  - increase gabapentin to 400 mg qid 11/20/2019 and add pepcid 20 mg bid  Of the three most common causes of  Sub-acute / recurrent or chronic cough, only one (GERD)  can actually contribute to/ trigger  the other two (asthma and post nasal drip syndrome)  and perpetuate the cylce of cough.  While not intuitively obvious, many patients with chronic low grade reflux do not cough until there is a primary insult that disturbs the protective epithelial barrier and exposes sensitive nerve endings.   This is typically viral but can due to PNDS and  either may apply here.     >>>the point is that once this occurs, it is difficult to eliminate the cycle  using anything but a maximally effective acid suppression regimen (which will need to include pepcid for now if ok with renal)   , accompanied by an appropriate diet to address non acid GERD and control / eliminate the cough itself with the use of gabapentin 400 mg qid and encourage use of non-mint/menthol hard rock candies to promote swallowing rather than throat clearing.    >>> f/u in 4 weeks with all meds in hand using a trust but verify approach to confirm accurate Medication  Reconciliation The principal here is that until we are certain that the  patients are doing what we've asked, it makes no sense to ask them to do more.          Each maintenance medication was reviewed in detail including emphasizing most importantly the difference between maintenance and prns and under what circumstances the prns are to be triggered using an action plan format where appropriate.  Total time for H and P, chart review,  and generating customized AVS unique to this office visit / charting = 45 min

## 2019-11-25 ENCOUNTER — Other Ambulatory Visit: Payer: Self-pay | Admitting: Radiology

## 2019-11-25 ENCOUNTER — Other Ambulatory Visit: Payer: Self-pay | Admitting: Student

## 2019-11-26 ENCOUNTER — Other Ambulatory Visit: Payer: Self-pay

## 2019-11-26 ENCOUNTER — Ambulatory Visit (HOSPITAL_COMMUNITY)
Admission: RE | Admit: 2019-11-26 | Discharge: 2019-11-26 | Disposition: A | Discharge status: Home / Self Care | Payer: Medicare HMO | Source: Ambulatory Visit | Attending: Internal Medicine | Admitting: Internal Medicine

## 2019-11-26 DIAGNOSIS — I129 Hypertensive chronic kidney disease with stage 1 through stage 4 chronic kidney disease, or unspecified chronic kidney disease: Secondary | ICD-10-CM | POA: Insufficient documentation

## 2019-11-26 DIAGNOSIS — E039 Hypothyroidism, unspecified: Secondary | ICD-10-CM | POA: Insufficient documentation

## 2019-11-26 DIAGNOSIS — N184 Chronic kidney disease, stage 4 (severe): Secondary | ICD-10-CM | POA: Diagnosis not present

## 2019-11-26 DIAGNOSIS — E1122 Type 2 diabetes mellitus with diabetic chronic kidney disease: Secondary | ICD-10-CM | POA: Diagnosis not present

## 2019-11-26 DIAGNOSIS — Z794 Long term (current) use of insulin: Secondary | ICD-10-CM | POA: Insufficient documentation

## 2019-11-26 DIAGNOSIS — N189 Chronic kidney disease, unspecified: Secondary | ICD-10-CM | POA: Diagnosis not present

## 2019-11-26 DIAGNOSIS — E785 Hyperlipidemia, unspecified: Secondary | ICD-10-CM | POA: Insufficient documentation

## 2019-11-26 DIAGNOSIS — Z951 Presence of aortocoronary bypass graft: Secondary | ICD-10-CM | POA: Insufficient documentation

## 2019-11-26 DIAGNOSIS — I2510 Atherosclerotic heart disease of native coronary artery without angina pectoris: Secondary | ICD-10-CM | POA: Diagnosis not present

## 2019-11-26 DIAGNOSIS — Z8249 Family history of ischemic heart disease and other diseases of the circulatory system: Secondary | ICD-10-CM | POA: Insufficient documentation

## 2019-11-26 DIAGNOSIS — I252 Old myocardial infarction: Secondary | ICD-10-CM | POA: Diagnosis not present

## 2019-11-26 DIAGNOSIS — Z833 Family history of diabetes mellitus: Secondary | ICD-10-CM | POA: Insufficient documentation

## 2019-11-26 DIAGNOSIS — E1151 Type 2 diabetes mellitus with diabetic peripheral angiopathy without gangrene: Secondary | ICD-10-CM | POA: Diagnosis not present

## 2019-11-26 DIAGNOSIS — N179 Acute kidney failure, unspecified: Secondary | ICD-10-CM | POA: Insufficient documentation

## 2019-11-26 DIAGNOSIS — I10 Essential (primary) hypertension: Secondary | ICD-10-CM

## 2019-11-26 DIAGNOSIS — Z79899 Other long term (current) drug therapy: Secondary | ICD-10-CM | POA: Diagnosis not present

## 2019-11-26 DIAGNOSIS — Z87891 Personal history of nicotine dependence: Secondary | ICD-10-CM | POA: Insufficient documentation

## 2019-11-26 DIAGNOSIS — Z79890 Hormone replacement therapy (postmenopausal): Secondary | ICD-10-CM | POA: Insufficient documentation

## 2019-11-26 DIAGNOSIS — D649 Anemia, unspecified: Secondary | ICD-10-CM

## 2019-11-26 LAB — CBC
HCT: 38 % (ref 36.0–46.0)
Hemoglobin: 12.2 g/dL (ref 12.0–15.0)
MCH: 29.8 pg (ref 26.0–34.0)
MCHC: 32.1 g/dL (ref 30.0–36.0)
MCV: 92.7 fL (ref 80.0–100.0)
Platelets: 180 10*3/uL (ref 150–400)
RBC: 4.1 MIL/uL (ref 3.87–5.11)
RDW: 13.9 % (ref 11.5–15.5)
WBC: 8.5 10*3/uL (ref 4.0–10.5)
nRBC: 0 % (ref 0.0–0.2)

## 2019-11-26 LAB — GLUCOSE, CAPILLARY: Glucose-Capillary: 140 mg/dL — ABNORMAL HIGH (ref 70–99)

## 2019-11-26 LAB — PROTIME-INR
INR: 1 (ref 0.8–1.2)
Prothrombin Time: 13.3 seconds (ref 11.4–15.2)

## 2019-11-26 MED ORDER — SODIUM CHLORIDE 0.9 % IV SOLN: INTRAVENOUS | Status: DC

## 2019-11-26 MED ORDER — MIDAZOLAM HCL 2 MG/2ML IJ SOLN
INTRAMUSCULAR | Status: AC
  Filled 2019-11-26: qty 2

## 2019-11-26 MED ORDER — LIDOCAINE HCL (PF) 1 % IJ SOLN
INTRAMUSCULAR | Status: AC
  Filled 2019-11-26: qty 30

## 2019-11-26 MED ORDER — MIDAZOLAM HCL 2 MG/2ML IJ SOLN
INTRAMUSCULAR | Status: AC | PRN
  Administered 2019-11-26: 1 mg via INTRAVENOUS
  Administered 2019-11-26: 0.5 mg via INTRAVENOUS

## 2019-11-26 MED ORDER — HYDRALAZINE HCL 25 MG PO TABS
25.0000 mg | ORAL_TABLET | Freq: Once | ORAL | Status: DC
  Filled 2019-11-26: qty 1

## 2019-11-26 MED ORDER — HYDRALAZINE HCL 20 MG/ML IJ SOLN
INTRAMUSCULAR | Status: AC
  Filled 2019-11-26: qty 1

## 2019-11-26 MED ORDER — FENTANYL CITRATE (PF) 100 MCG/2ML IJ SOLN
INTRAMUSCULAR | Status: AC
  Filled 2019-11-26: qty 2

## 2019-11-26 MED ORDER — HYDRALAZINE HCL 20 MG/ML IJ SOLN
INTRAMUSCULAR | Status: AC | PRN
  Administered 2019-11-26: 10 mg via INTRAVENOUS

## 2019-11-26 MED ORDER — FENTANYL CITRATE (PF) 100 MCG/2ML IJ SOLN
INTRAMUSCULAR | Status: AC | PRN
  Administered 2019-11-26 (×2): 25 ug via INTRAVENOUS

## 2019-11-26 NOTE — Sedation Documentation (Addendum)
187/71 current BP. BP too elevated to safely proceed at this time. Patient took home blood pressure medications at 0815 this morning. Will stop procedure for now and proceed when systolic blood pressure is less than 160 per Dr. Anselm Pancoast. Patient aware and understands.

## 2019-11-26 NOTE — Progress Notes (Signed)
Pt discharged, PA saw patient again and spoke with Dr. Anselm Pancoast. No further evaluation needed.

## 2019-11-26 NOTE — Procedures (Signed)
Interventional Radiology Procedure:   Indications: Acute on chronic kidney disease   Procedure: US guided renal biopsy  Findings: Echogenic kidneys.  3 cores from left kidney lower pole  Complications: None     EBL: less than 10 ml  Plan: Strict bedrest 4 hours   Donna Howe R. Anselm Pancoast, MD  Pager: [phone]

## 2019-11-26 NOTE — H&P (Signed)
Chief Complaint: Patient was seen in consultation today for chronic kidney disease stage 4  Referring Physician(s): Jannifer Hick A  Supervising Physician: Markus Daft  Patient Status: [facility] - Out-pt  History of Present Illness: Donna Howe is a 64 y.o. female with past medical history of anemia, asthma, CAD s/p multivessel CABG in 2005, s/p R carotid endarterectomy, HTN, PAD, and DM II who presents to [facility] today for kidney biopsy.  Donna Howe has had recent acute progression of her renal disease evidenced by urine microscopy with cellular casts suspicions for ATN from PPI use.  Renal biopsy requested by Dr. Johnney Ou.   Donna Howe presesnts for procedure today in her usual state of health. She denies new concerns including fever, chills, nausea, vomiting, abdominal pain, dysuria, cough, shortness of breath.   She has been NPO.  She has appropriately held her Plavix.   Her BP is elevated at 190/61  Past Medical History:  Diagnosis Date  . Anemia   . Asthma   . CAD (coronary artery disease)    Multivessel s/p CABG 2005, numerous PCIs since that time  . Carotid artery disease (HCC)    R CEA  . Chronic kidney disease (CKD), stage III (moderate)   . Essential hypertension   . Gout   . Hyperlipidemia   . Hypothyroidism   . Myocardial infarction ([place])   . PAD (peripheral artery disease) ([place])    Dr. Kellie Simmering  . S/P angioplasty with stent- DES to [facility] and to [place] to LAD with DES 04/09/18.   04/10/2018  . SBO (small bowel obstruction) ([place]) 2011   Status post lysis of adhesions & hernia repair  . Sinus bradycardia   . Thrombocytopenia, unspecified ([place])   . Type 2 diabetes mellitus ([place])   . Umbilical hernia     Past Surgical History:  Procedure Laterality Date  . CESAREAN SECTION  1984  . CHOLECYSTECTOMY  2010  . CORONARY ARTERY BYPASS GRAFT  2005  . CORONARY BALLOON ANGIOPLASTY N/A 05/31/2017   Procedure: CORONARY BALLOON ANGIOPLASTY;  Surgeon: Jettie Booze, MD;  Location: [place] CV LAB;  Service: Cardiovascular;  Laterality: N/A;  . CORONARY STENT INTERVENTION N/A 05/31/2017   Procedure: CORONARY STENT INTERVENTION;  Surgeon: Jettie Booze, MD;  Location: [place] CV LAB;  Service: Cardiovascular;  Laterality: N/A;  . CORONARY STENT INTERVENTION N/A 04/09/2018   Procedure: CORONARY STENT INTERVENTION;  Surgeon: Jettie Booze, MD;  Location: [place] CV LAB;  Service: Cardiovascular;  Laterality: N/A;  SVG RCA  . ENDARTERECTOMY Right 04/18/2013   Procedure: ENDARTERECTOMY CAROTID;  Surgeon: Mal Misty, MD;  Location: [place];  Service: Vascular;  Laterality: Right;  . HERNIA REPAIR  1989  . Incisional hernia repair x2  03/04/2010   Laparoscopic with 35cm mesh by Dr Ronnald Collum  . LEFT HEART CATH AND CORS/GRAFTS ANGIOGRAPHY N/A 05/31/2017   Procedure: LEFT HEART CATH AND CORS/GRAFTS ANGIOGRAPHY;  Surgeon: Jettie Booze, MD;  Location: [place] CV LAB;  Service: Cardiovascular;  Laterality: N/A;  . LEFT HEART CATH AND CORS/GRAFTS ANGIOGRAPHY N/A 04/08/2018   Procedure: LEFT HEART CATH AND CORS/GRAFTS ANGIOGRAPHY;  Surgeon: Jettie Booze, MD;  Location: [place] CV LAB;  Service: Cardiovascular;  Laterality: N/A;  . LEFT HEART CATHETERIZATION WITH CORONARY ANGIOGRAM N/A 12/19/2012   Procedure: LEFT HEART CATHETERIZATION WITH CORONARY ANGIOGRAM;  Surgeon: Josue Hector, MD;  Location: [facility] CATH LAB;  Service: Cardiovascular;  Laterality: N/A;  . LEFT HEART CATHETERIZATION  WITH CORONARY/GRAFT ANGIOGRAM N/A 04/19/2013   Procedure: LEFT HEART CATHETERIZATION WITH Beatrix Fetters;  Surgeon: Lorretta Harp, MD;  Location: [facility] CATH LAB;  Service: Cardiovascular;  Laterality: N/A;  . PATCH ANGIOPLASTY Right 04/18/2013   Procedure: PATCH ANGIOPLASTY Right Internal Carotid Artery;  Surgeon: Mal Misty, MD;  Location: [place];  Service: Vascular;  Laterality: Right;  . PERCUTANEOUS CORONARY STENT  INTERVENTION (PCI-S) Right 12/19/2012   Procedure: PERCUTANEOUS CORONARY STENT INTERVENTION (PCI-S);  Surgeon: Josue Hector, MD;  Location: [facility] CATH LAB;  Service: Cardiovascular;  Laterality: Right;  . SHOULDER SURGERY      Allergies: Penicillins  Medications: Prior to Admission medications   Medication Sig Start Date End Date Taking? Authorizing Provider  Albuterol Sulfate 108 (90 Base) MCG/ACT AEPB Inhale 2 puffs into the lungs every 4 (four) hours as needed (Shortness of breath).    Yes [provider]  ALPRAZolam Duanne Moron) 0.5 MG tablet Take 0.25-0.5 mg by mouth See admin instructions. Take 0.25 mg tab by mouth every morning & evening as needed  and .05 mg tab at bedtime as needed   Yes [provider]  amLODipine (NORVASC) 10 MG tablet Take 10 mg by mouth daily.    Yes [provider]  aspirin EC 81 MG tablet Take 81 mg by mouth daily.    Yes [provider]  atorvastatin (LIPITOR) 80 MG tablet Take 1 tablet (80 mg total) by mouth daily. Patient taking differently: Take 80 mg by mouth every evening.  06/01/17  Yes Johnson, Clanford L, MD  clopidogrel (PLAVIX) 75 MG tablet Take 1 tablet (75 mg total) by mouth daily. 08/27/18  Yes Satira Sark, MD  colchicine 0.6 MG tablet Take 0.6 mg by mouth daily.    Yes [provider]  famotidine (PEPCID) 20 MG tablet One after bfast and supper Patient taking differently: Take 20 mg by mouth daily. One after bfast and supper 11/20/19  Yes Tanda Rockers, MD  gabapentin (NEURONTIN) 400 MG capsule Take 1 capsule (400 mg total) by mouth 4 (four) times daily. 11/20/19  Yes Tanda Rockers, MD  hydrALAZINE (APRESOLINE) 50 MG tablet Take 75 mg by mouth 3 (three) times daily. 06/10/19  Yes [provider]  isosorbide dinitrate (ISORDIL) 5 MG tablet Take 5 mg by mouth 2 (two) times daily.   Yes [provider]  levothyroxine (SYNTHROID, LEVOTHROID) 125 MCG tablet Take 125 mcg by mouth daily.     Yes [provider]  nitroGLYCERIN (NITROSTAT) 0.4 MG SL tablet Place 0.4 mg under the tongue every 5 (five) minutes as needed for chest pain. Not to exceed 3 in 15 minute time frame   Yes [provider]  NOVOLIN 70/30 RELION (70-30) 100 UNIT/ML injection Inject 24 Units into the skin 2 (two) times daily with a meal. Am & PM 08/28/15  Yes [provider]  ondansetron (ZOFRAN ODT) 8 MG disintegrating tablet Take 1 tablet (8 mg total) by mouth every 8 (eight) hours as needed for nausea or vomiting. 08/14/15  Yes Evalee Jefferson, PA-C     Family History  Problem Relation Age of Onset  . Diabetes Mother   . Heart disease Mother        before age 45  . Hyperlipidemia Mother   . Hypertension Mother   . Thyroid disease Father   . Hypertension Father   . AAA (abdominal aortic aneurysm) Father   . Heart disease Brother  before age 55  . Hypertension Brother   . Hyperlipidemia Son   . Hypertension Son     Social History   Socioeconomic History  . Marital status: Divorced    Spouse name: Not on file  . Number of children: Not on file  . Years of education: Not on file  . Highest education level: Not on file  Occupational History  . Occupation: Disabled  Tobacco Use  . Smoking status: Former Smoker    Packs/day: 1.00    Years: 20.00    Pack years: 20.00    Types: Cigarettes    Quit date: 12/10/2012    Years since quitting: 6.9  . Smokeless tobacco: Never Used  Substance and Sexual Activity  . Alcohol use: No    Alcohol/week: 0.0 standard drinks  . Drug use: No  . Sexual activity: Not Currently  Other Topics Concern  . Not on file  Social History Narrative   Lives with mother.   Social Determinants of Health   Financial Resource Strain:   . Difficulty of Paying Living Expenses: Not on file  Food Insecurity:   . Worried About Charity fundraiser in the Last Year: Not on file  . Ran Out of Food in the Last Year: Not on file  Transportation  Needs:   . Lack of Transportation (Medical): Not on file  . Lack of Transportation (Non-Medical): Not on file  Physical Activity:   . Days of Exercise per Week: Not on file  . Minutes of Exercise per Session: Not on file  Stress:   . Feeling of Stress : Not on file  Social Connections:   . Frequency of Communication with Friends and Family: Not on file  . Frequency of Social Gatherings with Friends and Family: Not on file  . Attends Religious Services: Not on file  . Active Member of Clubs or Organizations: Not on file  . Attends Archivist Meetings: Not on file  . Marital Status: Not on file     Review of Systems: A 12 point ROS discussed and pertinent positives are indicated in the HPI above.  All other systems are negative.  Review of Systems  Constitutional: Negative for fatigue and fever.  Respiratory: Negative for cough and shortness of breath.   Cardiovascular: Negative for chest pain.  Gastrointestinal: Negative for abdominal pain.  Genitourinary: Negative for dysuria.  Musculoskeletal: Negative for back pain.  Psychiatric/Behavioral: Negative for behavioral problems and confusion.    Vital Signs: BP (!) 172/59 (BP Location: Right Arm)   Pulse 63   Temp 98 F (36.7 C) (Skin)   Resp 20   Ht 5\' 5"  (1.651 m)   Wt 218 lb (98.9 kg)   SpO2 94%   BMI 36.28 kg/m   Physical Exam Vitals and nursing note reviewed.  Constitutional:      Appearance: Normal appearance.  HENT:     Mouth/Throat:     Mouth: Mucous membranes are moist.     Pharynx: Oropharynx is clear.  Cardiovascular:     Rate and Rhythm: Normal rate and regular rhythm.  Pulmonary:     Effort: Pulmonary effort is normal. No respiratory distress.     Breath sounds: Normal breath sounds.  Abdominal:     General: Abdomen is flat.     Palpations: Abdomen is soft.  Skin:    General: Skin is warm and dry.  Neurological:     General: No focal deficit present.     Mental Status:  She is alert and  oriented to person, place, and time. Mental status is at baseline.  Psychiatric:        Mood and Affect: Mood normal.        Behavior: Behavior normal.        Thought Content: Thought content normal.        Judgment: Judgment normal.     MD Evaluation Airway: WNL Heart: WNL Abdomen: WNL Chest/ Lungs: WNL ASA  Classification: 3 Mallampati/Airway Score: One   Imaging: DG Chest 2 View  Result Date: 11/20/2019 CLINICAL DATA:  Cough EXAM: CHEST - 2 VIEW COMPARISON:  10/01/2019 FINDINGS: Status post median sternotomy. Pulmonary vascular prominence without overt edema. The visualized skeletal structures are unremarkable. IMPRESSION: Pulmonary vascular prominence without overt pulmonary edema. No focal airspace opacity. Electronically Signed   By: Eddie Candle M.D.   On: 11/20/2019 16:57    Labs:  CBC: Recent Labs    10/01/19 2056 10/02/19 0425 10/03/19 0616 11/26/19 0700  WBC 13.7* 13.4* 8.7 8.5  HGB 12.6 11.5* 10.8* 12.2  HCT 39.5 36.6 34.1* 38.0  PLT 198 187 157 180    COAGS: Recent Labs    10/02/19 0607 11/26/19 0700  INR 1.0 1.0    BMP: Recent Labs    10/01/19 2056 10/02/19 0425 10/03/19 0616  NA 132* 138 139  K 5.1 5.0 4.6  CL 105 109 113*  CO2 20* 20* 21*  GLUCOSE 316* 152* 91  BUN 62* 65* 64*  CALCIUM 8.6* 8.8* 8.1*  CREATININE 3.02* 2.82* 2.67*  GFRNONAA 16* 17* 19*  GFRAA 19* 20* 21*    LIVER FUNCTION TESTS: Recent Labs    10/01/19 2056 10/02/19 0425  BILITOT 0.4 0.3  AST 24 22  ALT 32 29  ALKPHOS 170* 141*  PROT 7.2 6.3*  ALBUMIN 3.5 3.1*    TUMOR MARKERS: No results for input(s): AFPTM, CEA, CA199, CHROMGRNA in the last 8760 hours.  Assessment and Plan: Patient with past medical history of chronic renal disease presents with complaint of progressively worsening labs.  IR consulted for renal biopsy at the request of Dr. Johnney Ou. Case reviewed by Dr. Anselm Pancoast who approves patient for procedure.  Patient presents today in their usual  state of health.  He BP is elevated.  She typically takes 75 mg hydralazine TID but took none of her medication today.  She has been NPO and is not currently on blood thinners as she has appropriately held her Plavix.  Start with 25 mg hydralazine.  Continue to work to improve blood pressure prior to procedure today.   Risks and benefits of discussed with the patient and/or patient's family including, but not limited to bleeding, infection, damage to adjacent structures or low yield requiring additional tests.  All of the questions were answered and there is agreement to proceed.  Consent signed and in chart.   Thank you for this interesting consult.  I greatly enjoyed meeting Kennis Buell Cull and look forward to participating in their care.  A copy of this report was sent to the requesting provider on this date.  Electronically Signed: Docia Barrier, PA 11/26/2019, 8:12 AM   I spent a total of  30 Minutes   in face to face in clinical consultation, greater than 50% of which was counseling/coordinating care for chronic kidney disease.

## 2019-11-26 NOTE — Discharge Instructions (Signed)
Percutaneous Kidney Biopsy, Care After This sheet gives you information about how to care for yourself after your procedure. Your health care provider may also give you more specific instructions. If you have problems or questions, contact your health care provider. What can I expect after the procedure? After the procedure, it is common to have:  Pain or soreness near the biopsy site.  Pink or cloudy urine for 24 hours after the procedure. Follow these instructions at home: Activity  Return to your normal activities as told by your health care provider. Ask your health care provider what activities are safe for you.  If you were given a sedative during the procedure, it can affect you for several hours. Do not drive or operate machinery until your health care provider says that it is safe.  Do not lift anything that is heavier than 10 lb (4.5 kg), or the limit that you are told, until your health care provider says that it is safe.  Avoid activities that take a lot of effort (are strenuous) until your health care provider approves. Most people will have to wait 2 weeks before returning to activities such as exercise or sex. General instructions   Take over-the-counter and prescription medicines only as told by your health care provider.  You may eat and drink after your procedure. Follow instructions from your health care provider about eating or drinking restrictions.  Check your biopsy site every day for signs of infection. Check for: ? More redness, swelling, or pain. ? Fluid or blood. ? Warmth. ? Pus or a bad smell.  Keep all follow-up visits as told by your health care provider. This is important. Contact a health care provider if:  You have more redness, swelling, or pain around your biopsy site.  You have fluid or blood coming from your biopsy site.  Your biopsy site feels warm to the touch.  You have pus or a bad smell coming from your biopsy site.  You have blood  in your urine more than 24 hours after your procedure.  You have a fever. Get help right away if:  Your urine is dark red or brown.  You cannot urinate.  It burns when you urinate.  You feel dizzy or light-headed.  You have severe pain in your abdomen or side. Summary  After the procedure, it is common to have pain or soreness at the biopsy site and pink or cloudy urine for the first 24 hours.  Check your biopsy site each day for signs of infection, such as more redness, swelling, or pain; fluid, blood, pus or a bad smell coming from the biopsy site; or the biopsy site feeling warm to touch.  Return to your normal activities as told by your health care provider. This information is not intended to replace advice given to you by your health care provider. Make sure you discuss any questions you have with your health care provider. Document Revised: 05/08/2019 Document Reviewed: 05/08/2019 Elsevier Patient Education  2020 [employer]  

## 2019-11-26 NOTE — Sedation Documentation (Signed)
Blood Pressure 158/61, procedure started.

## 2019-11-26 NOTE — Progress Notes (Signed)
While reviewing discharge instructions patient began to complain of left sided flank pain, radiating to her bladder. Pt noted bright red blood when she urinated. Vital signs stable, patient ambulated without difficulty. PA Myriam Jacobson to bedside to examine. Orthostatic vitals performed, vital signs remained stable. Patient returned to restroom and had no blood in urine. PA update and am waiting for MD approval to discharge.

## 2019-11-26 NOTE — Sedation Documentation (Signed)
Pt states that she has not taken any medication this morning and she has not taken ASA or plavix since 3/3

## 2019-11-26 NOTE — Progress Notes (Signed)
Patient assessed in short stay this afternoon after renal biopsy this AM.   She complains of crampy abdominal pressure which she believes is gas from being NPO all day.  No sharp, persistent pain.   Unable to reproduce pain on physical exam.  Procedure site is intact.  She is nontender to palpation in back, flank, and LLQ abdomen.  Her vital signs are stable.  BP 153/89 during visit.  Ambulating without difficulty.  Denies dizziness or lightheadedness.  States her first urine after procedure was "watermelon red," but is now yellow.   Agency for discharge home today.  Given instructions to return to the ED if she develops concerning symptoms such as hematuria or severe flank pain, dizziness, lightheadedness.   Patient verbalizes understanding.   Brynda Greathouse, MS RD PA-C 3:10 PM

## 2019-11-26 NOTE — Progress Notes (Signed)
Discharge instructions reviewed with patient and aunt. Verbalized understanding.

## 2019-12-15 ENCOUNTER — Other Ambulatory Visit: Payer: Self-pay | Admitting: *Deleted

## 2019-12-15 DIAGNOSIS — E7849 Other hyperlipidemia: Secondary | ICD-10-CM | POA: Diagnosis not present

## 2019-12-15 DIAGNOSIS — E782 Mixed hyperlipidemia: Secondary | ICD-10-CM

## 2019-12-15 DIAGNOSIS — I6523 Occlusion and stenosis of bilateral carotid arteries: Secondary | ICD-10-CM

## 2019-12-15 DIAGNOSIS — I739 Peripheral vascular disease, unspecified: Secondary | ICD-10-CM

## 2019-12-15 DIAGNOSIS — I10 Essential (primary) hypertension: Secondary | ICD-10-CM | POA: Diagnosis not present

## 2019-12-15 LAB — SURGICAL PATHOLOGY

## 2019-12-16 ENCOUNTER — Ambulatory Visit (INDEPENDENT_AMBULATORY_CARE_PROVIDER_SITE_OTHER)
Admission: RE | Admit: 2019-12-16 | Discharge: 2019-12-16 | Disposition: A | Discharge status: Home / Self Care | Payer: Medicare HMO | Source: Ambulatory Visit | Attending: Vascular Surgery | Admitting: Vascular Surgery

## 2019-12-16 ENCOUNTER — Encounter: Payer: Self-pay | Admitting: Vascular Surgery

## 2019-12-16 ENCOUNTER — Ambulatory Visit: Payer: Medicare HMO | Admitting: Vascular Surgery

## 2019-12-16 ENCOUNTER — Other Ambulatory Visit: Payer: Self-pay

## 2019-12-16 VITALS — BP 162/68 | HR 84 | Temp 97.9°F | Resp 16 | Ht 66.0 in | Wt 221.0 lb

## 2019-12-16 DIAGNOSIS — Z880 Allergy status to penicillin: Secondary | ICD-10-CM | POA: Diagnosis not present

## 2019-12-16 DIAGNOSIS — J9601 Acute respiratory failure with hypoxia: Secondary | ICD-10-CM | POA: Diagnosis not present

## 2019-12-16 DIAGNOSIS — Z833 Family history of diabetes mellitus: Secondary | ICD-10-CM | POA: Diagnosis not present

## 2019-12-16 DIAGNOSIS — I739 Peripheral vascular disease, unspecified: Secondary | ICD-10-CM

## 2019-12-16 DIAGNOSIS — E1129 Type 2 diabetes mellitus with other diabetic kidney complication: Secondary | ICD-10-CM | POA: Diagnosis not present

## 2019-12-16 DIAGNOSIS — Z87891 Personal history of nicotine dependence: Secondary | ICD-10-CM | POA: Diagnosis not present

## 2019-12-16 DIAGNOSIS — Z83438 Family history of other disorder of lipoprotein metabolism and other lipidemia: Secondary | ICD-10-CM | POA: Diagnosis not present

## 2019-12-16 DIAGNOSIS — E1151 Type 2 diabetes mellitus with diabetic peripheral angiopathy without gangrene: Secondary | ICD-10-CM | POA: Diagnosis not present

## 2019-12-16 DIAGNOSIS — I2510 Atherosclerotic heart disease of native coronary artery without angina pectoris: Secondary | ICD-10-CM | POA: Diagnosis not present

## 2019-12-16 DIAGNOSIS — E782 Mixed hyperlipidemia: Secondary | ICD-10-CM

## 2019-12-16 DIAGNOSIS — E785 Hyperlipidemia, unspecified: Secondary | ICD-10-CM | POA: Diagnosis not present

## 2019-12-16 DIAGNOSIS — R05 Cough: Secondary | ICD-10-CM | POA: Diagnosis not present

## 2019-12-16 DIAGNOSIS — R0601 Orthopnea: Secondary | ICD-10-CM | POA: Diagnosis not present

## 2019-12-16 DIAGNOSIS — I130 Hypertensive heart and chronic kidney disease with heart failure and stage 1 through stage 4 chronic kidney disease, or unspecified chronic kidney disease: Secondary | ICD-10-CM | POA: Diagnosis not present

## 2019-12-16 DIAGNOSIS — Z20828 Contact with and (suspected) exposure to other viral communicable diseases: Secondary | ICD-10-CM | POA: Diagnosis not present

## 2019-12-16 DIAGNOSIS — Z7902 Long term (current) use of antithrombotics/antiplatelets: Secondary | ICD-10-CM | POA: Diagnosis not present

## 2019-12-16 DIAGNOSIS — E039 Hypothyroidism, unspecified: Secondary | ICD-10-CM | POA: Diagnosis not present

## 2019-12-16 DIAGNOSIS — Z9049 Acquired absence of other specified parts of digestive tract: Secondary | ICD-10-CM | POA: Diagnosis not present

## 2019-12-16 DIAGNOSIS — I70203 Unspecified atherosclerosis of native arteries of extremities, bilateral legs: Secondary | ICD-10-CM | POA: Diagnosis present

## 2019-12-16 DIAGNOSIS — R0602 Shortness of breath: Secondary | ICD-10-CM | POA: Diagnosis not present

## 2019-12-16 DIAGNOSIS — J45909 Unspecified asthma, uncomplicated: Secondary | ICD-10-CM | POA: Diagnosis not present

## 2019-12-16 DIAGNOSIS — Z951 Presence of aortocoronary bypass graft: Secondary | ICD-10-CM | POA: Diagnosis not present

## 2019-12-16 DIAGNOSIS — I10 Essential (primary) hypertension: Secondary | ICD-10-CM | POA: Diagnosis not present

## 2019-12-16 DIAGNOSIS — J189 Pneumonia, unspecified organism: Secondary | ICD-10-CM | POA: Diagnosis not present

## 2019-12-16 DIAGNOSIS — N183 Chronic kidney disease, stage 3 unspecified: Secondary | ICD-10-CM | POA: Diagnosis not present

## 2019-12-16 DIAGNOSIS — I129 Hypertensive chronic kidney disease with stage 1 through stage 4 chronic kidney disease, or unspecified chronic kidney disease: Secondary | ICD-10-CM | POA: Diagnosis not present

## 2019-12-16 DIAGNOSIS — Z20822 Contact with and (suspected) exposure to covid-19: Secondary | ICD-10-CM | POA: Diagnosis not present

## 2019-12-16 DIAGNOSIS — E7849 Other hyperlipidemia: Secondary | ICD-10-CM | POA: Diagnosis not present

## 2019-12-16 DIAGNOSIS — I6523 Occlusion and stenosis of bilateral carotid arteries: Secondary | ICD-10-CM

## 2019-12-16 DIAGNOSIS — Z8349 Family history of other endocrine, nutritional and metabolic diseases: Secondary | ICD-10-CM | POA: Diagnosis not present

## 2019-12-16 DIAGNOSIS — R079 Chest pain, unspecified: Secondary | ICD-10-CM | POA: Diagnosis not present

## 2019-12-16 DIAGNOSIS — I252 Old myocardial infarction: Secondary | ICD-10-CM | POA: Diagnosis not present

## 2019-12-16 DIAGNOSIS — N184 Chronic kidney disease, stage 4 (severe): Secondary | ICD-10-CM | POA: Diagnosis not present

## 2019-12-16 DIAGNOSIS — E1122 Type 2 diabetes mellitus with diabetic chronic kidney disease: Secondary | ICD-10-CM | POA: Diagnosis not present

## 2019-12-16 DIAGNOSIS — E1165 Type 2 diabetes mellitus with hyperglycemia: Secondary | ICD-10-CM | POA: Diagnosis not present

## 2019-12-16 DIAGNOSIS — R509 Fever, unspecified: Secondary | ICD-10-CM | POA: Diagnosis not present

## 2019-12-16 DIAGNOSIS — Z8249 Family history of ischemic heart disease and other diseases of the circulatory system: Secondary | ICD-10-CM | POA: Diagnosis not present

## 2019-12-16 DIAGNOSIS — I70213 Atherosclerosis of native arteries of extremities with intermittent claudication, bilateral legs: Secondary | ICD-10-CM

## 2019-12-16 DIAGNOSIS — Z794 Long term (current) use of insulin: Secondary | ICD-10-CM | POA: Diagnosis not present

## 2019-12-16 DIAGNOSIS — Z955 Presence of coronary angioplasty implant and graft: Secondary | ICD-10-CM | POA: Diagnosis not present

## 2019-12-16 DIAGNOSIS — N179 Acute kidney failure, unspecified: Secondary | ICD-10-CM | POA: Diagnosis not present

## 2019-12-16 DIAGNOSIS — N1832 Chronic kidney disease, stage 3b: Secondary | ICD-10-CM | POA: Diagnosis not present

## 2019-12-16 MED ORDER — CILOSTAZOL 100 MG PO TABS: 100.0000 mg | ORAL_TABLET | Freq: Two times a day (BID) | ORAL | 11 refills | Status: DC

## 2019-12-16 NOTE — Progress Notes (Signed)
Patient name: Donna Howe MRN: [id number] DOB: 08/09/1956 Sex: female  REASON FOR CONSULT: Re-establish care for carotid artery disease and evaluate for PAD  HPI: Donna Howe is a 64 y.o. female, with history of hypertension, hyperlipidemia, chronic kidney disease stage 3, coronary artery disease status post CABG, diabetes that presents to re-establish care for her carotid artery disease and for evaluation of peripheral vascular disease.  As it relates to her carotid artery disease she previously had a right carotid endarterectomy by Dr. Kellie Simmering on 04/18/2013 for asymptomatic severe stenosis.  She reports no history of neurologic events and no previous TIAs or strokes. She states her bigger concern is the fact that her legs hurt when she walks.  She states [place] the [place] and could not walk long distances because she gets burning in both calves but always improves when she stops.  No rest pain or tissue loss.  She denies any lower extremity interventions other than a vein harvest from her right leg for her CABG.  She denies any active tobacco abuse.  She states the other limiting factor is she gets chest pain and shortness of breath whenever she walks long distances and feels that is more limiting than her legs right now.  Past Medical History:  Diagnosis Date  . Anemia   . Asthma   . CAD (coronary artery disease)    Multivessel s/p CABG 2005, numerous PCIs since that time  . Carotid artery disease (HCC)    R CEA  . Chronic kidney disease (CKD), stage III (moderate)   . Essential hypertension   . Gout   . Hyperlipidemia   . Hypothyroidism   . Myocardial infarction ([place])   . PAD (peripheral artery disease) ([place])    Dr. Kellie Simmering  . S/P angioplasty with stent- DES to [facility] and to [facility] to [facility] with DES 04/09/18.   04/10/2018  . SBO (small bowel obstruction) ([place]) 2011   Status post lysis of adhesions & hernia repair  . Sinus bradycardia   . Thrombocytopenia, unspecified ([place])    . Type 2 diabetes mellitus ([place])   . Umbilical hernia     Past Surgical History:  Procedure Laterality Date  . CESAREAN SECTION  1984  . CHOLECYSTECTOMY  2010  . CORONARY ARTERY BYPASS GRAFT  2005  . CORONARY BALLOON ANGIOPLASTY N/A 05/31/2017   Procedure: CORONARY BALLOON ANGIOPLASTY;  Surgeon: Jettie Booze, MD;  Location: [place] [facility];  Service: Cardiovascular;  Laterality: N/A;  . CORONARY STENT INTERVENTION N/A 05/31/2017   Procedure: CORONARY STENT INTERVENTION;  Surgeon: Jettie Booze, MD;  Location: [place] [facility];  Service: Cardiovascular;  Laterality: N/A;  . CORONARY STENT INTERVENTION N/A 04/09/2018   Procedure: CORONARY STENT INTERVENTION;  Surgeon: Jettie Booze, MD;  Location: [place] [facility];  Service: Cardiovascular;  Laterality: N/A;  SVG RCA  . ENDARTERECTOMY Right 04/18/2013   Procedure: ENDARTERECTOMY CAROTID;  Surgeon: Mal Misty, MD;  Location: [place];  Service: Vascular;  Laterality: Right;  . HERNIA REPAIR  1989  . Incisional hernia repair x2  03/04/2010   Laparoscopic with 35cm mesh by Dr Ronnald Collum  . LEFT HEART CATH AND CORS/GRAFTS ANGIOGRAPHY N/A 05/31/2017   Procedure: LEFT HEART CATH AND CORS/GRAFTS ANGIOGRAPHY;  Surgeon: Jettie Booze, MD;  Location: [place] [facility];  Service: Cardiovascular;  Laterality: N/A;  . LEFT HEART CATH AND CORS/GRAFTS ANGIOGRAPHY N/A 04/08/2018   Procedure: LEFT HEART CATH AND CORS/GRAFTS  ANGIOGRAPHY;  Surgeon: Jettie Booze, MD;  Location: [place] [facility];  Service: Cardiovascular;  Laterality: N/A;  . LEFT HEART CATHETERIZATION WITH CORONARY ANGIOGRAM N/A 12/19/2012   Procedure: LEFT HEART CATHETERIZATION WITH CORONARY ANGIOGRAM;  Surgeon: Josue Hector, MD;  Location: [facility] CATH LAB;  Service: Cardiovascular;  Laterality: N/A;  . LEFT HEART CATHETERIZATION WITH CORONARY/GRAFT ANGIOGRAM N/A 04/19/2013   Procedure: LEFT HEART CATHETERIZATION WITH Beatrix Fetters;  Surgeon:  Lorretta Harp, MD;  Location[facility] C[facility];  Service: Cardiovascular;  Laterality: N/A;  . PATCH ANGIOPLASTY Right 04/18/2013   Procedure: PATCH ANGIOPLASTY Right Internal Carotid Artery;  Surgeon: Mal Misty, MD;  Location: [place];  Service: Vascular;  Laterality: Right;  . PERCUTANEOUS CORONARY STENT INTERVENTION (PCI-S) Right 12/19/2012   Procedure: PERCUTANEOUS CORONARY STENT INTERVENTION (PCI-S);  Surgeon: Josue Hector, MD;  Location: [facility] CATH LAB;  Service: Cardiovascular;  Laterality: Right;  . SHOULDER SURGERY      Family History  Problem Relation Age of Onset  . Diabetes Mother   . Heart disease Mother        before age 64  . Hyperlipidemia Mother   . Hypertension Mother   . Thyroid disease Father   . Hypertension Father   . AAA (abdominal aortic aneurysm) Father   . Heart disease Brother        before age 71  . Hypertension Brother   . Hyperlipidemia Son   . Hypertension Son     SOCIAL HISTORY: Social History   Socioeconomic History  . Marital status: Divorced    Spouse name: Not on file  . Number of children: Not on file  . Years of education: Not on file  . Highest education level: Not on file  Occupational History  . Occupation: Disabled  Tobacco Use  . Smoking status: Former Smoker    Packs/day: 1.00    Years: 20.00    Pack years: 20.00    Types: Cigarettes    Quit date: 12/10/2012    Years since quitting: 7.0  . Smokeless tobacco: Never Used  Substance and Sexual Activity  . Alcohol use: No    Alcohol/week: 0.0 standard drinks  . Drug use: No  . Sexual activity: Not Currently  Other Topics Concern  . Not on file  Social History Narrative   Lives with mother.   Social Determinants of Health   Financial Resource Strain:   . Difficulty of Paying Living Expenses:   Food Insecurity:   . Worried About Charity fundraiser in the Last Year:   . Arboriculturist in the Last Year:   Transportation Needs:   . Film/video editor (Medical):   Marland Kitchen  Lack of Transportation (Non-Medical):   Physical Activity:   . Days of Exercise per Week:   . Minutes of Exercise per Session:   Stress:   . Feeling of Stress :   Social Connections:   . Frequency of Communication with Friends and Family:   . Frequency of Social Gatherings with Friends and Family:   . Attends Religious Services:   . Active Member of Clubs or Organizations:   . Attends Archivist Meetings:   Marland Kitchen Marital Status:   Intimate Partner Violence:   . Fear of Current or Ex-Partner:   . Emotionally Abused:   Marland Kitchen Physically Abused:   . Sexually Abused:     Allergies  Allergen Reactions  . Penicillins Other (See Comments)    REACTION: Unknown, told  as a child Has patient had a PCN reaction causing immediate rash, facial/tongue/throat swelling, SOB or lightheadedness with hypotension: Unknown Has patient had a PCN reaction causing severe rash involving mucus membranes or skin necrosis: Unknown Has patient had a PCN reaction that required hospitalization: Unknown Has patient had a PCN reaction occurring within the last 10 years: No If all of the above answers are "NO", then may proceed with Cephalosporin use.     Current Outpatient Medications  Medication Sig Dispense Refill  . Albuterol Sulfate 108 (90 Base) MCG/ACT AEPB Inhale 2 puffs into the lungs every 4 (four) hours as needed (Shortness of breath).     . ALPRAZolam (XANAX) 0.5 MG tablet Take 0.25-0.5 mg by mouth See admin instructions. Take 0.25 mg tab by mouth every morning & evening as needed  and .05 mg tab at bedtime as needed    . amLODipine (NORVASC) 10 MG tablet Take 10 mg by mouth daily.     Marland Kitchen aspirin EC 81 MG tablet Take 81 mg by mouth daily.     Marland Kitchen atorvastatin (LIPITOR) 80 MG tablet Take 1 tablet (80 mg total) by mouth daily. (Patient taking differently: Take 80 mg by mouth every evening. ) 30 tablet 0  . clopidogrel (PLAVIX) 75 MG tablet Take 1 tablet (75 mg total) by mouth daily. 90 tablet 3  .  colchicine 0.6 MG tablet Take 0.6 mg by mouth daily.     . famotidine (PEPCID) 20 MG tablet One after bfast and supper (Patient taking differently: Take 20 mg by mouth daily. One after bfast and supper) 60 tablet 11  . gabapentin (NEURONTIN) 400 MG capsule Take 1 capsule (400 mg total) by mouth 4 (four) times daily. 120 capsule 2  . hydrALAZINE (APRESOLINE) 50 MG tablet Take 75 mg by mouth 3 (three) times daily.    . isosorbide dinitrate (ISORDIL) 5 MG tablet Take 5 mg by mouth 2 (two) times daily.    Marland Kitchen levothyroxine (SYNTHROID, LEVOTHROID) 125 MCG tablet Take 125 mcg by mouth daily.     . nitroGLYCERIN (NITROSTAT) 0.4 MG SL tablet Place 0.4 mg under the tongue every 5 (five) minutes as needed for chest pain. Not to exceed 3 in 15 minute time frame    . NOVOLIN 70/30 RELION (70-30) 100 UNIT/ML injection Inject 24 Units into the skin 2 (two) times daily with a meal. Am & PM    . ondansetron (ZOFRAN ODT) 8 MG disintegrating tablet Take 1 tablet (8 mg total) by mouth every 8 (eight) hours as needed for nausea or vomiting. 20 tablet 0   No current facility-administered medications for this visit.    REVIEW OF SYSTEMS:  [X]  denotes positive finding, [ ]  denotes negative finding Cardiac  Comments:  Chest pain or chest pressure: x   Shortness of breath upon exertion: x   Short of breath when lying flat: x   Irregular heart rhythm:        Vascular    Pain in calf, thigh, or hip brought on by ambulation: x   Pain in feet at night that wakes you up from your sleep:     Blood clot in your veins:    Leg swelling:  x       Pulmonary    Oxygen at home:    Productive cough:     Wheezing:         Neurologic    Sudden weakness in arms or legs:     Sudden numbness in  arms or legs:     Sudden onset of difficulty speaking or slurred speech:    Temporary loss of vision in one eye:     Problems with dizziness:         Gastrointestinal    Blood in stool:     Vomited blood:         Genitourinary     Burning when urinating:     Blood in urine:        Psychiatric    Major depression:         Hematologic    Bleeding problems:    Problems with blood clotting too easily:        Skin    Rashes or ulcers:        Constitutional    Fever or chills:      PHYSICAL EXAM: Vitals:   12/16/19 0922 12/16/19 0924  BP: (!) 141/74 (!) 162/68  Pulse: 84 84  Resp: 16   Temp: 97.9 F (36.6 C)   TempSrc: Temporal   SpO2: 99%   Weight: 221 lb (100.2 kg)   Height: 5\' 6"  (1.676 m)     GENERAL: The patient is a well-nourished female, in no acute distress. The vital signs are documented above. CARDIAC: There is a regular rate and rhythm.  VASCULAR:  Palpable femoral pulses bilaterally 1 palpable dorsalis pedis pulses bilaterally No lower extremity tissue loss PULMONARY: There is good air exchange bilaterally without wheezing or rales. ABDOMEN: Soft and non-tender with normal pitched bowel sounds.  MUSCULOSKELETAL: There are no major deformities or cyanosis. NEUROLOGIC: No focal weakness or paresthesias are detected. SKIN: There are no ulcers or rashes noted. PSYCHIATRIC: The patient has a normal affect.  DATA:   Carotid duplex today shows a 60 to 79% stenosis in the right ICA which in side of her previous endarterectomy.  No significant disease on the left with only 1 to 39% left ICA stenosis.  ABI 0.73 on the right biphasic and 0.68 on the left biphasic  Assessment/Plan:  64 year old female with multiple medical issues as noted above that presents to reestablish care given her previous vascular surgeon retired.  She was last seen by our practice in 2016 for remote right carotid endarterectomy.  As it relates to her carotid artery disease, she has previously had a right carotid endarterectomy.  On duplex today her right ICA has a 60 to 79% recurrent stenosis based on velocity criteria.  This remains asymptomatic carotid disease and she has no history of TIAs or strokes.  I discussed  that in the setting of asymptomatic disease we would continue observation unless she develops more than 80% stenosis.  Her current velocities are on the low end of the 60 to 79% range.  I will have her follow-up again in 6 months with another carotid duplex for ongoing surveillance.  As it relates to her PAD she does endorse bilateral lower extremity calf claudication.  Her ABIs are in the 0.7 range bilaterally and I can actually appreciate a dorsalis pedis pulse on exam.  I discussed with her that claudication is not a limb threatening situation and we typically manage this very conservatively with walking therapies, aspirin, statin as well as Pletal.  It appears that she is having chest pain shortness of breath with walking and that is limiting her more than her legs and I advised that she get an appointment with her cardiologist Dr. Domenic Polite asap.  She will call today to try and get an  appointment.  Discussed that after she sees Dr. Domenic Polite I will start her on Pletal to see if that helps her walking distances.  We will see her back in 6 months with ABIs at the same time as her carotid surveillance.   Marty Heck, MD [facility] Office: [phone]

## 2019-12-17 ENCOUNTER — Other Ambulatory Visit: Payer: Self-pay

## 2019-12-17 ENCOUNTER — Encounter (HOSPITAL_COMMUNITY): Payer: Self-pay | Admitting: *Deleted

## 2019-12-17 ENCOUNTER — Other Ambulatory Visit: Payer: Self-pay | Admitting: *Deleted

## 2019-12-17 ENCOUNTER — Inpatient Hospital Stay (HOSPITAL_COMMUNITY)
Admission: EM | Admit: 2019-12-17 | Discharge: 2019-12-19 | DRG: 193 | Disposition: A | Discharge status: Home / Self Care | Payer: Medicare HMO | Attending: Family Medicine | Admitting: Family Medicine

## 2019-12-17 ENCOUNTER — Emergency Department (HOSPITAL_COMMUNITY): Payer: Medicare HMO

## 2019-12-17 DIAGNOSIS — E039 Hypothyroidism, unspecified: Secondary | ICD-10-CM | POA: Diagnosis present

## 2019-12-17 DIAGNOSIS — I2510 Atherosclerotic heart disease of native coronary artery without angina pectoris: Secondary | ICD-10-CM | POA: Diagnosis present

## 2019-12-17 DIAGNOSIS — Z833 Family history of diabetes mellitus: Secondary | ICD-10-CM

## 2019-12-17 DIAGNOSIS — J189 Pneumonia, unspecified organism: Principal | ICD-10-CM | POA: Diagnosis present

## 2019-12-17 DIAGNOSIS — E1151 Type 2 diabetes mellitus with diabetic peripheral angiopathy without gangrene: Secondary | ICD-10-CM | POA: Diagnosis present

## 2019-12-17 DIAGNOSIS — Z794 Long term (current) use of insulin: Secondary | ICD-10-CM | POA: Diagnosis not present

## 2019-12-17 DIAGNOSIS — Z9049 Acquired absence of other specified parts of digestive tract: Secondary | ICD-10-CM | POA: Diagnosis not present

## 2019-12-17 DIAGNOSIS — Z951 Presence of aortocoronary bypass graft: Secondary | ICD-10-CM | POA: Diagnosis not present

## 2019-12-17 DIAGNOSIS — Z955 Presence of coronary angioplasty implant and graft: Secondary | ICD-10-CM | POA: Diagnosis not present

## 2019-12-17 DIAGNOSIS — Z79899 Other long term (current) drug therapy: Secondary | ICD-10-CM

## 2019-12-17 DIAGNOSIS — N179 Acute kidney failure, unspecified: Secondary | ICD-10-CM | POA: Diagnosis not present

## 2019-12-17 DIAGNOSIS — I129 Hypertensive chronic kidney disease with stage 1 through stage 4 chronic kidney disease, or unspecified chronic kidney disease: Secondary | ICD-10-CM | POA: Diagnosis not present

## 2019-12-17 DIAGNOSIS — N1832 Chronic kidney disease, stage 3b: Secondary | ICD-10-CM | POA: Diagnosis not present

## 2019-12-17 DIAGNOSIS — I739 Peripheral vascular disease, unspecified: Secondary | ICD-10-CM

## 2019-12-17 DIAGNOSIS — I9589 Other hypotension: Secondary | ICD-10-CM | POA: Diagnosis present

## 2019-12-17 DIAGNOSIS — E785 Hyperlipidemia, unspecified: Secondary | ICD-10-CM | POA: Diagnosis present

## 2019-12-17 DIAGNOSIS — J45909 Unspecified asthma, uncomplicated: Secondary | ICD-10-CM | POA: Diagnosis present

## 2019-12-17 DIAGNOSIS — J9601 Acute respiratory failure with hypoxia: Secondary | ICD-10-CM | POA: Diagnosis not present

## 2019-12-17 DIAGNOSIS — R0602 Shortness of breath: Secondary | ICD-10-CM | POA: Diagnosis present

## 2019-12-17 DIAGNOSIS — R05 Cough: Secondary | ICD-10-CM | POA: Diagnosis not present

## 2019-12-17 DIAGNOSIS — E1122 Type 2 diabetes mellitus with diabetic chronic kidney disease: Secondary | ICD-10-CM | POA: Diagnosis not present

## 2019-12-17 DIAGNOSIS — Z7902 Long term (current) use of antithrombotics/antiplatelets: Secondary | ICD-10-CM

## 2019-12-17 DIAGNOSIS — R778 Other specified abnormalities of plasma proteins: Secondary | ICD-10-CM

## 2019-12-17 DIAGNOSIS — Z8349 Family history of other endocrine, nutritional and metabolic diseases: Secondary | ICD-10-CM | POA: Diagnosis not present

## 2019-12-17 DIAGNOSIS — E782 Mixed hyperlipidemia: Secondary | ICD-10-CM | POA: Diagnosis not present

## 2019-12-17 DIAGNOSIS — I70203 Unspecified atherosclerosis of native arteries of extremities, bilateral legs: Secondary | ICD-10-CM | POA: Diagnosis present

## 2019-12-17 DIAGNOSIS — R0603 Acute respiratory distress: Secondary | ICD-10-CM

## 2019-12-17 DIAGNOSIS — Z83438 Family history of other disorder of lipoprotein metabolism and other lipidemia: Secondary | ICD-10-CM

## 2019-12-17 DIAGNOSIS — I252 Old myocardial infarction: Secondary | ICD-10-CM | POA: Diagnosis not present

## 2019-12-17 DIAGNOSIS — E7849 Other hyperlipidemia: Secondary | ICD-10-CM | POA: Diagnosis not present

## 2019-12-17 DIAGNOSIS — R509 Fever, unspecified: Secondary | ICD-10-CM | POA: Diagnosis not present

## 2019-12-17 DIAGNOSIS — Z20828 Contact with and (suspected) exposure to other viral communicable diseases: Secondary | ICD-10-CM | POA: Diagnosis not present

## 2019-12-17 DIAGNOSIS — Z7982 Long term (current) use of aspirin: Secondary | ICD-10-CM

## 2019-12-17 DIAGNOSIS — N183 Chronic kidney disease, stage 3 unspecified: Secondary | ICD-10-CM | POA: Diagnosis present

## 2019-12-17 DIAGNOSIS — Z8249 Family history of ischemic heart disease and other diseases of the circulatory system: Secondary | ICD-10-CM

## 2019-12-17 DIAGNOSIS — Z87891 Personal history of nicotine dependence: Secondary | ICD-10-CM | POA: Diagnosis not present

## 2019-12-17 DIAGNOSIS — I6523 Occlusion and stenosis of bilateral carotid arteries: Secondary | ICD-10-CM

## 2019-12-17 DIAGNOSIS — N184 Chronic kidney disease, stage 4 (severe): Secondary | ICD-10-CM | POA: Diagnosis not present

## 2019-12-17 DIAGNOSIS — R0601 Orthopnea: Secondary | ICD-10-CM | POA: Diagnosis not present

## 2019-12-17 DIAGNOSIS — I70213 Atherosclerosis of native arteries of extremities with intermittent claudication, bilateral legs: Secondary | ICD-10-CM | POA: Diagnosis not present

## 2019-12-17 DIAGNOSIS — Z79890 Hormone replacement therapy (postmenopausal): Secondary | ICD-10-CM

## 2019-12-17 DIAGNOSIS — E1129 Type 2 diabetes mellitus with other diabetic kidney complication: Secondary | ICD-10-CM | POA: Diagnosis not present

## 2019-12-17 DIAGNOSIS — Z880 Allergy status to penicillin: Secondary | ICD-10-CM | POA: Diagnosis not present

## 2019-12-17 DIAGNOSIS — Z20822 Contact with and (suspected) exposure to covid-19: Secondary | ICD-10-CM | POA: Diagnosis present

## 2019-12-17 DIAGNOSIS — I130 Hypertensive heart and chronic kidney disease with heart failure and stage 1 through stage 4 chronic kidney disease, or unspecified chronic kidney disease: Secondary | ICD-10-CM | POA: Diagnosis not present

## 2019-12-17 DIAGNOSIS — E1165 Type 2 diabetes mellitus with hyperglycemia: Secondary | ICD-10-CM | POA: Diagnosis not present

## 2019-12-17 DIAGNOSIS — I10 Essential (primary) hypertension: Secondary | ICD-10-CM | POA: Diagnosis present

## 2019-12-17 DIAGNOSIS — R079 Chest pain, unspecified: Secondary | ICD-10-CM | POA: Diagnosis not present

## 2019-12-17 DIAGNOSIS — J45901 Unspecified asthma with (acute) exacerbation: Secondary | ICD-10-CM | POA: Diagnosis present

## 2019-12-17 LAB — CBC WITH DIFFERENTIAL/PLATELET
Abs Immature Granulocytes: 0.08 10*3/uL — ABNORMAL HIGH (ref 0.00–0.07)
Basophils Absolute: 0.1 10*3/uL (ref 0.0–0.1)
Basophils Relative: 1 %
Eosinophils Absolute: 0.1 10*3/uL (ref 0.0–0.5)
Eosinophils Relative: 1 %
HCT: 35.4 % — ABNORMAL LOW (ref 36.0–46.0)
Hemoglobin: 10.9 g/dL — ABNORMAL LOW (ref 12.0–15.0)
Immature Granulocytes: 1 %
Lymphocytes Relative: 21 %
Lymphs Abs: 1.9 10*3/uL (ref 0.7–4.0)
MCH: 29.4 pg (ref 26.0–34.0)
MCHC: 30.8 g/dL (ref 30.0–36.0)
MCV: 95.4 fL (ref 80.0–100.0)
Monocytes Absolute: 0.8 10*3/uL (ref 0.1–1.0)
Monocytes Relative: 9 %
Neutro Abs: 6.3 10*3/uL (ref 1.7–7.7)
Neutrophils Relative %: 67 %
Platelets: 168 10*3/uL (ref 150–400)
RBC: 3.71 MIL/uL — ABNORMAL LOW (ref 3.87–5.11)
RDW: 14.4 % (ref 11.5–15.5)
WBC: 9.2 10*3/uL (ref 4.0–10.5)
nRBC: 0 % (ref 0.0–0.2)

## 2019-12-17 LAB — LACTIC ACID, PLASMA
Lactic Acid, Venous: 1.3 mmol/L (ref 0.5–1.9)
Lactic Acid, Venous: 1.1 mmol/L (ref 0.5–1.9)

## 2019-12-17 LAB — TROPONIN I (HIGH SENSITIVITY)
Troponin I (High Sensitivity): 384 ng/L (ref <18)
Troponin I (High Sensitivity): 483 ng/L (ref <18)
Troponin I (High Sensitivity): 559 ng/L (ref <18)

## 2019-12-17 LAB — COMPREHENSIVE METABOLIC PANEL
ALT: 26 U/L (ref 0–44)
AST: 18 U/L (ref 15–41)
Albumin: 2.8 g/dL — ABNORMAL LOW (ref 3.5–5.0)
Alkaline Phosphatase: 140 U/L — ABNORMAL HIGH (ref 38–126)
Anion gap: 10 (ref 5–15)
BUN: 41 mg/dL — ABNORMAL HIGH (ref 8–23)
CO2: 22 mmol/L (ref 22–32)
Calcium: 8.6 mg/dL — ABNORMAL LOW (ref 8.9–10.3)
Chloride: 105 mmol/L (ref 98–111)
Creatinine, Ser: 3.47 mg/dL — ABNORMAL HIGH (ref 0.44–1.00)
GFR calc Af Amer: 16 mL/min — ABNORMAL LOW (ref >60)
GFR calc non Af Amer: 14 mL/min — ABNORMAL LOW (ref >60)
Glucose, Bld: 216 mg/dL — ABNORMAL HIGH (ref 70–99)
Potassium: 4.2 mmol/L (ref 3.5–5.1)
Sodium: 137 mmol/L (ref 135–145)
Total Bilirubin: 0.6 mg/dL (ref 0.3–1.2)
Total Protein: 6.3 g/dL — ABNORMAL LOW (ref 6.5–8.1)

## 2019-12-17 LAB — GLUCOSE, CAPILLARY: Glucose-Capillary: 170 mg/dL — ABNORMAL HIGH (ref 70–99)

## 2019-12-17 LAB — BRAIN NATRIURETIC PEPTIDE: B Natriuretic Peptide: 713 pg/mL — ABNORMAL HIGH (ref 0.0–100.0)

## 2019-12-17 MED ORDER — ALBUTEROL SULFATE (2.5 MG/3ML) 0.083% IN NEBU: 3.0000 mL | INHALATION_SOLUTION | RESPIRATORY_TRACT | Status: DC | PRN

## 2019-12-17 MED ORDER — GABAPENTIN 400 MG PO CAPS
400.0000 mg | ORAL_CAPSULE | Freq: Four times a day (QID) | ORAL | Status: DC
  Administered 2019-12-17 – 2019-12-18 (×2): 400 mg via ORAL
  Filled 2019-12-17 (×2): qty 1

## 2019-12-17 MED ORDER — ASPIRIN EC 81 MG PO TBEC
81.0000 mg | DELAYED_RELEASE_TABLET | Freq: Every day | ORAL | Status: DC
  Administered 2019-12-18 – 2019-12-19 (×2): 81 mg via ORAL
  Filled 2019-12-17 (×2): qty 1

## 2019-12-17 MED ORDER — HYDRALAZINE HCL 25 MG PO TABS
75.0000 mg | ORAL_TABLET | Freq: Two times a day (BID) | ORAL | Status: DC
  Administered 2019-12-17 – 2019-12-18 (×2): 75 mg via ORAL
  Filled 2019-12-17 (×3): qty 3

## 2019-12-17 MED ORDER — SODIUM CHLORIDE 0.9 % IV SOLN
2.0000 g | INTRAVENOUS | Status: DC
  Administered 2019-12-18: 2 g via INTRAVENOUS
  Filled 2019-12-17: qty 20

## 2019-12-17 MED ORDER — ONDANSETRON HCL 4 MG/2ML IJ SOLN
4.0000 mg | Freq: Four times a day (QID) | INTRAMUSCULAR | Status: DC | PRN
  Administered 2019-12-18: 4 mg via INTRAVENOUS
  Filled 2019-12-17: qty 2

## 2019-12-17 MED ORDER — ALPRAZOLAM 0.5 MG PO TABS
0.5000 mg | ORAL_TABLET | Freq: Every evening | ORAL | Status: DC | PRN
  Administered 2019-12-17 – 2019-12-19 (×2): 0.5 mg via ORAL
  Filled 2019-12-17 (×2): qty 1

## 2019-12-17 MED ORDER — INSULIN ASPART PROT & ASPART (70-30 MIX) 100 UNIT/ML ~~LOC~~ SUSP
18.0000 [IU] | Freq: Two times a day (BID) | SUBCUTANEOUS | Status: DC
  Administered 2019-12-18 (×2): 18 [IU] via SUBCUTANEOUS
  Filled 2019-12-17: qty 10

## 2019-12-17 MED ORDER — FAMOTIDINE 20 MG PO TABS
20.0000 mg | ORAL_TABLET | Freq: Two times a day (BID) | ORAL | Status: DC
  Administered 2019-12-17 – 2019-12-18 (×2): 20 mg via ORAL
  Filled 2019-12-17 (×2): qty 1

## 2019-12-17 MED ORDER — LEVOTHYROXINE SODIUM 25 MCG PO TABS
125.0000 ug | ORAL_TABLET | Freq: Every day | ORAL | Status: DC
  Administered 2019-12-18 – 2019-12-19 (×2): 125 ug via ORAL
  Filled 2019-12-17 (×2): qty 1

## 2019-12-17 MED ORDER — ONDANSETRON HCL 4 MG PO TABS: 4.0000 mg | ORAL_TABLET | Freq: Four times a day (QID) | ORAL | Status: DC | PRN

## 2019-12-17 MED ORDER — ALBUTEROL SULFATE (2.5 MG/3ML) 0.083% IN NEBU
3.0000 mL | INHALATION_SOLUTION | Freq: Four times a day (QID) | RESPIRATORY_TRACT | Status: DC
  Filled 2019-12-17: qty 3

## 2019-12-17 MED ORDER — ISOSORBIDE DINITRATE 10 MG PO TABS
ORAL_TABLET | ORAL | Status: AC
  Filled 2019-12-17: qty 1

## 2019-12-17 MED ORDER — SODIUM CHLORIDE 0.9 % IV SOLN
500.0000 mg | INTRAVENOUS | Status: DC
  Administered 2019-12-17 – 2019-12-18 (×2): 500 mg via INTRAVENOUS
  Filled 2019-12-17 (×2): qty 500

## 2019-12-17 MED ORDER — ALBUTEROL SULFATE HFA 108 (90 BASE) MCG/ACT IN AERS
2.0000 | INHALATION_SPRAY | RESPIRATORY_TRACT | Status: DC | PRN
  Administered 2019-12-18 (×2): 2 via RESPIRATORY_TRACT

## 2019-12-17 MED ORDER — POLYETHYLENE GLYCOL 3350 17 G PO PACK: 17.0000 g | PACK | Freq: Every day | ORAL | Status: DC | PRN

## 2019-12-17 MED ORDER — SODIUM CHLORIDE 0.9 % IV SOLN
1.0000 g | Freq: Once | INTRAVENOUS | Status: AC
  Administered 2019-12-17: 1 g via INTRAVENOUS
  Filled 2019-12-17: qty 10

## 2019-12-17 MED ORDER — HEPARIN SODIUM (PORCINE) 5000 UNIT/ML IJ SOLN
5000.0000 [IU] | Freq: Three times a day (TID) | INTRAMUSCULAR | Status: DC
  Administered 2019-12-18 – 2019-12-19 (×3): 5000 [IU] via SUBCUTANEOUS
  Filled 2019-12-17 (×4): qty 1

## 2019-12-17 MED ORDER — ALBUTEROL SULFATE HFA 108 (90 BASE) MCG/ACT IN AERS
4.0000 | INHALATION_SPRAY | Freq: Once | RESPIRATORY_TRACT | Status: AC
  Administered 2019-12-17: 4 via RESPIRATORY_TRACT
  Filled 2019-12-17: qty 6.7

## 2019-12-17 MED ORDER — INSULIN ASPART 100 UNIT/ML ~~LOC~~ SOLN
0.0000 [IU] | Freq: Three times a day (TID) | SUBCUTANEOUS | Status: DC
  Administered 2019-12-18: 2 [IU] via SUBCUTANEOUS

## 2019-12-17 MED ORDER — ALBUTEROL SULFATE HFA 108 (90 BASE) MCG/ACT IN AERS
INHALATION_SPRAY | RESPIRATORY_TRACT | Status: AC
  Administered 2019-12-17: 2 via RESPIRATORY_TRACT
  Filled 2019-12-17: qty 6.7

## 2019-12-17 MED ORDER — ACETAMINOPHEN 325 MG PO TABS
650.0000 mg | ORAL_TABLET | Freq: Four times a day (QID) | ORAL | Status: DC | PRN
  Administered 2019-12-18: 650 mg via ORAL
  Filled 2019-12-17 (×2): qty 2

## 2019-12-17 MED ORDER — INSULIN ASPART PROT & ASPART (70-30 MIX) 100 UNIT/ML ~~LOC~~ SUSP
24.0000 [IU] | Freq: Two times a day (BID) | SUBCUTANEOUS | Status: DC
  Filled 2019-12-17: qty 10

## 2019-12-17 MED ORDER — INSULIN ASPART 100 UNIT/ML ~~LOC~~ SOLN: 0.0000 [IU] | Freq: Every day | SUBCUTANEOUS | Status: DC

## 2019-12-17 MED ORDER — AMLODIPINE BESYLATE 5 MG PO TABS
10.0000 mg | ORAL_TABLET | Freq: Every day | ORAL | Status: DC
  Administered 2019-12-18 – 2019-12-19 (×2): 10 mg via ORAL
  Filled 2019-12-17 (×2): qty 2

## 2019-12-17 MED ORDER — NITROGLYCERIN 0.4 MG SL SUBL: 0.4000 mg | SUBLINGUAL_TABLET | SUBLINGUAL | Status: DC | PRN

## 2019-12-17 MED ORDER — SODIUM CHLORIDE 0.9 % IV SOLN: INTRAVENOUS | Status: AC

## 2019-12-17 MED ORDER — ASPIRIN 81 MG PO CHEW
324.0000 mg | CHEWABLE_TABLET | Freq: Once | ORAL | Status: AC
  Administered 2019-12-17: 324 mg via ORAL
  Filled 2019-12-17: qty 4

## 2019-12-17 MED ORDER — ACETAMINOPHEN 650 MG RE SUPP: 650.0000 mg | Freq: Four times a day (QID) | RECTAL | Status: DC | PRN

## 2019-12-17 MED ORDER — ISOSORBIDE DINITRATE 5 MG PO TABS
5.0000 mg | ORAL_TABLET | Freq: Two times a day (BID) | ORAL | Status: DC
  Administered 2019-12-17 – 2019-12-19 (×4): 5 mg via ORAL
  Filled 2019-12-17 (×11): qty 1

## 2019-12-17 MED ORDER — ATORVASTATIN CALCIUM 40 MG PO TABS
80.0000 mg | ORAL_TABLET | Freq: Every evening | ORAL | Status: DC
  Administered 2019-12-17 – 2019-12-18 (×2): 80 mg via ORAL
  Filled 2019-12-17: qty 2

## 2019-12-17 MED ORDER — GUAIFENESIN-DM 100-10 MG/5ML PO SYRP
10.0000 mL | ORAL_SOLUTION | Freq: Three times a day (TID) | ORAL | Status: DC
  Administered 2019-12-17 – 2019-12-18 (×2): 10 mL via ORAL
  Filled 2019-12-17 (×4): qty 10

## 2019-12-17 MED ORDER — CLOPIDOGREL BISULFATE 75 MG PO TABS
75.0000 mg | ORAL_TABLET | Freq: Every day | ORAL | Status: DC
  Administered 2019-12-18 – 2019-12-19 (×2): 75 mg via ORAL
  Filled 2019-12-17 (×2): qty 1

## 2019-12-17 NOTE — ED Triage Notes (Signed)
Pt was seen at her PCP this am for c/o chest pain, sob and arm pain; the office did an xray and it showed possible pneumonia and fluid; pt states she feels weak and has been having a fever since yesterday; pt states she has been coughing up yellow-green sputum

## 2019-12-17 NOTE — H&P (Addendum)
History and Physical    Donna Howe QVZ:[id number] DOB: 1956/03/25 DOA: 12/17/2019  PCP: [facility]   Patient coming from: home  I have personally briefly reviewed patient's old medical records in [place]  Chief Complaint: Chest pain, SOB, cough  HPI: Donna Howe is a 64 y.o. female with medical history significant for CABG, diabetes mellitus, hypertension, CKD 3, peripheral artery disease.  Patient went to see her PCP today for above complaints, had a chest x-ray done and was subsequently referred to the ED for "pneumonia and fluid".  Patient reports onset of  difficulty breathing and cough productive of yellowish-green sputum that.  She reports fevers up to 102 yesterday.   Reports nausea with dry heaving, and poor p.o. intake over the past few days but no real vomiting, no abdominal pain, no loose stools. Patient reports left-sided chest pain radiating down her left arm, that she noticed about a week ago when she went to the beach.  Chest pain was worse with activity -walking, and improved with rest.  Described chest pain as pressure-like, but different from chest pain she had when she had her heart attacks.  Since she came back on Sunday from the beach, she has not had recurrence of her chest pain. She reports weight gain over the past few weeks, stable and unchanged lower extremity swelling.  Hospitalization 1/13-1/15/20  for acute kidney injury, bronchitis, malignant hypertension and Chest pain.  Renal function improved with IV fluids, also with chest pain with mild elevation in troponin peaked at 104, down trended to 66.  Managed conservatively and thought secondary to respiratory bronchitis.  ED Course: 86% on room air improved with 2 L O2 to greater than 99%.  Blood pressure systolic 329J to 188C.  T-max 99.7.  WBC 9.2.  Creatinine elevated at 3.47.  Lactic acid normal 1.3.  Chest x-ray showed consistent with pneumonia lateral right base increased from  earlier in the day.  Repeated troponin 384 >> 483.  EKG showed some ST abnormalities in leads II, III, aVF, V5 through V6. EDP talked to cardiologist Dr. Bronson Ing, elevated troponin likely secondary to pneumonia, no immediate cardiology intervention needed at this time. IV ceftriaxone given in ED.  Hospitalist to admit for further evaluation and management.  Review of Systems: As per HPI all other systems reviewed and negative.  Past Medical History:  Diagnosis Date  . Anemia   . Asthma   . CAD (coronary artery disease)    Multivessel s/p CABG 2005, numerous PCIs since that time  . Carotid artery disease (HCC)    R CEA  . Chronic kidney disease (CKD), stage III (moderate)   . Essential hypertension   . Gout   . Hyperlipidemia   . Hypothyroidism   . Myocardial infarction ([place])   . PAD (peripheral artery disease) ([place])    Dr. Kellie Simmering  . S/P angioplasty with stent- DES to [facility] and to [facility] to LAD with DES 04/09/18.   04/10/2018  . SBO (small bowel obstruction) ([place]) 2011   Status post lysis of adhesions & hernia repair  . Sinus bradycardia   . Thrombocytopenia, unspecified ([place])   . Type 2 diabetes mellitus ([place])   . Umbilical hernia     Past Surgical History:  Procedure Laterality Date  . CESAREAN SECTION  1984  . CHOLECYSTECTOMY  2010  . CORONARY ARTERY BYPASS GRAFT  2005  . CORONARY BALLOON ANGIOPLASTY N/A 05/31/2017   Procedure: CORONARY BALLOON ANGIOPLASTY;  Surgeon: Larae Grooms  S, MD;  Location: [place] [facility];  Service: Cardiovascular;  Laterality: N/A;  . CORONARY STENT INTERVENTION N/A 05/31/2017   Procedure: CORONARY STENT INTERVENTION;  Surgeon: Jettie Booze, MD;  Location: [place] [facility];  Service: Cardiovascular;  Laterality: N/A;  . CORONARY STENT INTERVENTION N/A 04/09/2018   Procedure: CORONARY STENT INTERVENTION;  Surgeon: Jettie Booze, MD;  Location: [place] [facility];  Service: Cardiovascular;  Laterality: N/A;  SVG RCA  .  ENDARTERECTOMY Right 04/18/2013   Procedure: ENDARTERECTOMY CAROTID;  Surgeon: Mal Misty, MD;  Location: [place];  Service: Vascular;  Laterality: Right;  . HERNIA REPAIR  1989  . Incisional hernia repair x2  03/04/2010   Laparoscopic with 35cm mesh by Dr Ronnald Collum  . LEFT HEART CATH AND CORS/GRAFTS ANGIOGRAPHY N/A 05/31/2017   Procedure: LEFT HEART CATH AND CORS/GRAFTS ANGIOGRAPHY;  Surgeon: Jettie Booze, MD;  Location: [place] [facility];  Service: Cardiovascular;  Laterality: N/A;  . LEFT HEART CATH AND CORS/GRAFTS ANGIOGRAPHY N/A 04/08/2018   Procedure: LEFT HEART CATH AND CORS/GRAFTS ANGIOGRAPHY;  Surgeon: Jettie Booze, MD;  Location: [place] [facility];  Service: Cardiovascular;  Laterality: N/A;  . LEFT HEART CATHETERIZATION WITH CORONARY ANGIOGRAM N/A 12/19/2012   Procedure: LEFT HEART CATHETERIZATION WITH CORONARY ANGIOGRAM;  Surgeon: Josue Hector, MD;  Location[facility] C[facility] LAB;  Service: Cardiovascular;  Laterality: N/A;  . LEFT HEART CATHETERIZATION WITH CORONARY/GRAFT ANGIOGRAM N/A 04/19/2013   Procedure: LEFT HEART CATHETERIZATION WITH Beatrix Fetters;  Surgeon: Lorretta Harp, MD;  Location: [facility] CATH LAB;  Service: Cardiovascular;  Laterality: N/A;  . PATCH ANGIOPLASTY Right 04/18/2013   Procedure: PATCH ANGIOPLASTY Right Internal Carotid Artery;  Surgeon: Mal Misty, MD;  Location: [place];  Service: Vascular;  Laterality: Right;  . PERCUTANEOUS CORONARY STENT INTERVENTION (PCI-S) Right 12/19/2012   Procedure: PERCUTANEOUS CORONARY STENT INTERVENTION (PCI-S);  Surgeon: Josue Hector, MD;  Location: [facility] C[facility] LAB;  Service: Cardiovascular;  Laterality: Right;  . SHOULDER SURGERY       reports that she quit smoking about 7 years ago. Her smoking use included cigarettes. She has a 20.00 pack-year smoking history. She has never used smokeless tobacco. She reports that she does not drink alcohol or use drugs.  Allergies  Allergen Reactions  . Penicillins Other (See  Comments)    REACTION: Unknown, told as a child Has patient had a PCN reaction causing immediate rash, facial/tongue/throat swelling, SOB or lightheadedness with hypotension: Unknown Has patient had a PCN reaction causing severe rash involving mucus membranes or skin necrosis: Unknown Has patient had a PCN reaction that required hospitalization: Unknown Has patient had a PCN reaction occurring within the last 10 years: No If all of the above answers are "NO", then may proceed with Cephalosporin use.     Family History  Problem Relation Age of Onset  . Diabetes Mother   . Heart disease Mother        before age 69  . Hyperlipidemia Mother   . Hypertension Mother   . Thyroid disease Father   . Hypertension Father   . AAA (abdominal aortic aneurysm) Father   . Heart disease Brother        before age 29  . Hypertension Brother   . Hyperlipidemia Son   . Hypertension Son     Prior to Admission medications   Medication Sig Start Date End Date Taking? Authorizing Provider  Albuterol Sulfate 108 (90 Base) MCG/ACT AEPB Inhale 2 puffs  into the lungs every 4 (four) hours as needed (Shortness of breath).     [provider]  ALPRAZolam Duanne Moron) 0.5 MG tablet Take 0.25-0.5 mg by mouth See admin instructions. Take 0.25 mg tab by mouth every morning & evening as needed  and .05 mg tab at bedtime as needed    [provider]  amLODipine (NORVASC) 10 MG tablet Take 10 mg by mouth daily.     [provider]  aspirin EC 81 MG tablet Take 81 mg by mouth daily.     [provider]  atorvastatin (LIPITOR) 80 MG tablet Take 1 tablet (80 mg total) by mouth daily. Patient taking differently: Take 80 mg by mouth every evening.  06/01/17   Murlean Iba, MD  cilostazol (PLETAL) 100 MG tablet Take 1 tablet (100 mg total) by mouth 2 (two) times daily before a meal. 12/16/19   Marty Heck, MD  clopidogrel (PLAVIX) 75 MG tablet Take 1 tablet (75 mg total) by mouth  daily. 08/27/18   Satira Sark, MD  colchicine 0.6 MG tablet Take 0.6 mg by mouth daily.     [provider]  famotidine (PEPCID) 20 MG tablet One after bfast and supper Patient taking differently: Take 20 mg by mouth daily. One after bfast and supper 11/20/19   Tanda Rockers, MD  gabapentin (NEURONTIN) 400 MG capsule Take 1 capsule (400 mg total) by mouth 4 (four) times daily. 11/20/19   Tanda Rockers, MD  hydrALAZINE (APRESOLINE) 50 MG tablet Take 75 mg by mouth 3 (three) times daily. 06/10/19   [provider]  isosorbide dinitrate (ISORDIL) 5 MG tablet Take 5 mg by mouth 2 (two) times daily.    [provider]  levothyroxine (SYNTHROID, LEVOTHROID) 125 MCG tablet Take 125 mcg by mouth daily.     [provider]  nitroGLYCERIN (NITROSTAT) 0.4 MG SL tablet Place 0.4 mg under the tongue every 5 (five) minutes as needed for chest pain. Not to exceed 3 in 15 minute time frame    [provider]  NOVOLIN 70/30 RELION (70-30) 100 UNIT/ML injection Inject 24 Units into the skin 2 (two) times daily with a meal. Am & PM 08/28/15   [provider]  ondansetron (ZOFRAN ODT) 8 MG disintegrating tablet Take 1 tablet (8 mg total) by mouth every 8 (eight) hours as needed for nausea or vomiting. 08/14/15   Evalee Jefferson, PA-C    Physical Exam: Vitals:   12/17/19 1706 12/17/19 1710 12/17/19 1712 12/17/19 1713  BP:      Pulse: 73 74 73 73  Resp: 20 20 20 19   Temp:      TempSrc:      SpO2: 91% 94% 93% 92%  Weight:      Height:        Constitutional: NAD, calm, comfortable Vitals:   12/17/19 1706 12/17/19 1710 12/17/19 1712 12/17/19 1713  BP:      Pulse: 73 74 73 73  Resp: 20 20 20 19   Temp:      TempSrc:      SpO2: 91% 94% 93% 92%  Weight:      Height:       Eyes: PERRL, lids and conjunctivae normal ENMT: Mucous membranes are mildly dry . Posterior pharynx clear of any exudate or lesions. Neck: normal, supple, no masses, no  thyromegaly Respiratory: Very faint expiratory wheeze at the bases, . Normal respiratory effort. No accessory muscle use.  On 2 L  O2 Cardiovascular: Regular rate and rhythm, no murmurs / rubs / gallops.  +1 lower extremity swelling to knees right greater than left -this is at baseline,  2+ pedal pulses.   Abdomen: no tenderness, no masses palpated. No hepatosplenomegaly. Bowel sounds positive.  Musculoskeletal: no clubbing / cyanosis. No joint deformity upper and lower extremities. Good ROM, no contractures. Normal muscle tone.  Skin: no rashes, lesions, ulcers. No induration Neurologic: No apparent cranial nerve abnormality, moving all extremities spontaneously. Psychiatric: Normal judgment and insight. Alert and oriented x 3. Normal mood.   Labs on Admission: I have personally reviewed following labs and imaging studies  CBC: Recent Labs  Lab 12/17/19 1523  WBC 9.2  NEUTROABS 6.3  HGB 10.9*  HCT 35.4*  MCV 95.4  PLT 161   Basic Metabolic Panel: Recent Labs  Lab 12/17/19 1329  NA 137  K 4.2  CL 105  CO2 22  GLUCOSE 216*  BUN 41*  CREATININE 3.47*  CALCIUM 8.6*   Liver Function Tests: Recent Labs  Lab 12/17/19 1329  AST 18  ALT 26  ALKPHOS 140*  BILITOT 0.6  PROT 6.3*  ALBUMIN 2.8*    Radiological Exams on Admission: DG Chest Port 1 View  Result Date: 12/17/2019 CLINICAL DATA:  Shortness with cough and fever EXAM: PORTABLE CHEST 1 VIEW COMPARISON:  December 17, 2019 study obtained earlier in the day and November 20, 2019 FINDINGS: There is focal airspace opacity in the lateral right base. The lungs elsewhere are clear. Heart is slightly enlarged with pulmonary vascularity within normal limits. No adenopathy. Patient is status post internal mammary bypass grafting. No adenopathy. No bone lesions. IMPRESSION: Airspace opacity consistent with pneumonia lateral right base, increased from earlier in the day. Lungs elsewhere clear. Stable cardiac prominence. Electronically Signed    By: Lowella Grip III M.D.   On: 12/17/2019 14:05   VAS [place] ABI WITH/WO TBI  Result Date: 12/16/2019 LOWER EXTREMITY DOPPLER STUDY Indications: Claudication, and peripheral artery disease. High Risk Factors: Hypertension, hyperlipidemia, Diabetes, past history of                    smoking, prior MI, coronary artery disease.  Comparison Study: Prior at outside facility 08/31/2017 Right: 1.01/ 0.86 (TBI)                   and Left: 0.60/ 0.68 (TBI). Prior duplex 10/04/2017 showed a                   75-99% left SFA stenosis. Performing Technologist: Delorise Shiner RVT  Examination Guidelines: A complete evaluation includes at minimum, Doppler waveform signals and systolic blood pressure reading at the level of bilateral brachial, anterior tibial, and posterior tibial arteries, when vessel segments are accessible. Bilateral testing is considered an integral part of a complete examination. Photoelectric Plethysmograph (PPG) waveforms and toe systolic pressure readings are included as required and additional duplex testing as needed. Limited examinations for reoccurring indications may be performed as noted.  ABI Findings: +---------+------------------+-----+--------+--------+ Right    Rt Pressure (mmHg)IndexWaveformComment  +---------+------------------+-----+--------+--------+ Brachial 168                                     +---------+------------------+-----+--------+--------+ ATA      126               0.75                  +---------+------------------+-----+--------+--------+  PTA      123               0.73 biphasic         +---------+------------------+-----+--------+--------+ DP                              biphasic         +---------+------------------+-----+--------+--------+ Great Toe93                0.55                  +---------+------------------+-----+--------+--------+ +---------+------------------+-----+---------+-------+ Left     Lt Pressure  (mmHg)IndexWaveform Comment +---------+------------------+-----+---------+-------+ Brachial 146                                     +---------+------------------+-----+---------+-------+ ATA      115               0.68                  +---------+------------------+-----+---------+-------+ PTA      114               0.68 biphasic         +---------+------------------+-----+---------+-------+ DP                              triphasic        +---------+------------------+-----+---------+-------+ Great Toe63                0.38                  +---------+------------------+-----+---------+-------+ +-------+-----------+-----------+------------+------------+ ABI/TBIToday's ABIToday's TBIPrevious ABIPrevious TBI +-------+-----------+-----------+------------+------------+ Right  0.75       0.55                                +-------+-----------+-----------+------------+------------+ Left   0.68       0.38                                +-------+-----------+-----------+------------+------------+  Summary: Right: Resting right ankle-brachial index indicates moderate right lower extremity arterial disease. The right toe-brachial index is abnormal. RT great toe pressure = 93 mmHg. Left: Resting left ankle-brachial index indicates moderate left lower extremity arterial disease. The left toe-brachial index is abnormal. LT Great toe pressure = 63 mmHg.  *See table(s) above for measurements and observations.  Electronically signed by Monica Martinez MD on 12/16/2019 at 10:07:41 AM.    Final    VAS US CAROTID  Result Date: 12/16/2019 Carotid Arterial Duplex Study Indications:       Carotid artery disease, right endarterectomy and Right                    carotid endarterestomy08/09/2012. Risk Factors:      Hypertension, hyperlipidemia, Diabetes, past history of                    smoking, prior MI, coronary artery disease, PAD. Comparison Study:  08/04/2015 Carotid duplex  showed a patent right carotid                    endarterectomy site and <36% LICA stenosis. Performing  Technologist: Delorise Shiner RVT  Examination Guidelines: A complete evaluation includes B-mode imaging, spectral Doppler, color Doppler, and power Doppler as needed of all accessible portions of each vessel. Bilateral testing is considered an integral part of a complete examination. Limited examinations for reoccurring indications may be performed as noted.  Right Carotid Findings: +----------+--------+--------+--------+--------------------------+--------+           PSV cm/sEDV cm/sStenosisPlaque Description        Comments +----------+--------+--------+--------+--------------------------+--------+ CCA Prox  118     27                                                 +----------+--------+--------+--------+--------------------------+--------+ CCA Mid   113     27                                                 +----------+--------+--------+--------+--------------------------+--------+ CCA Distal80      24              irregular and heterogenous         +----------+--------+--------+--------+--------------------------+--------+ ICA Prox  177     70      60-79%  homogeneous and smooth             +----------+--------+--------+--------+--------------------------+--------+ ICA Mid   196     56                                                 +----------+--------+--------+--------+--------------------------+--------+ ICA Distal171     66                                                 +----------+--------+--------+--------+--------------------------+--------+ ECA       337     0       >50%                                       +----------+--------+--------+--------+--------------------------+--------+ +----------+--------+-------+----------------+-------------------+           PSV cm/sEDV cmsDescribe        Arm Pressure (mmHG)  +----------+--------+-------+----------------+-------------------+ [id number]      0      Multiphasic, WNL                    +----------+--------+-------+----------------+-------------------+ +---------+--------+--+--------+--+---------+ VertebralPSV cm/s79EDV cm/s27Antegrade +---------+--------+--+--------+--+---------+  Left Carotid Findings: +----------+--------+--------+--------+-------------------------+--------+           PSV cm/sEDV cm/sStenosisPlaque Description       Comments +----------+--------+--------+--------+-------------------------+--------+ CCA Prox  136     23              focal                             +----------+--------+--------+--------+-------------------------+--------+ CCA Mid   89      19                                                +----------+--------+--------+--------+-------------------------+--------+  CCA Distal118     29              irregular and homogeneous         +----------+--------+--------+--------+-------------------------+--------+ ICA Prox  128     33      1-39%   irregular                         +----------+--------+--------+--------+-------------------------+--------+ ICA Mid   128     36                                                +----------+--------+--------+--------+-------------------------+--------+ ICA Distal103     32                                                +----------+--------+--------+--------+-------------------------+--------+ ECA       123     15                                                +----------+--------+--------+--------+-------------------------+--------+ +----------+--------+--------+----------------+-------------------+           PSV cm/sEDV cm/sDescribe        Arm Pressure (mmHG) +----------+--------+--------+----------------+-------------------+ [id number]     0       Multiphasic, WNL                     +----------+--------+--------+----------------+-------------------+ +---------+--------+--+--------+--+---------+ VertebralPSV cm/s33EDV cm/s12Antegrade +---------+--------+--+--------+--+---------+   Summary: Right Carotid: Velocities in the right ICA are consistent with a 60-79%                stenosis. The ECA appears >50% stenosed. Left Carotid: Velocities in the left ICA are consistent with a 1-39% stenosis. Vertebrals: Bilateral vertebral arteries demonstrate antegrade flow. *See table(s) above for measurements and observations.  Electronically signed by Monica Martinez MD on 12/16/2019 at 5:12:06 PM.    Final     EKG: Independently reviewed.  Sinus rhythm QTc 462.  ST abnormalities/mild depression in leads II, III, aVF, V5 through V6 compared to prior EKG.  Assessment/Plan Principal Problem:   Pneumonia Active Problems:   Essential hypertension   Asthma   Chronic kidney disease (CKD), stage III (moderate) (HCC)   Diabetes mellitus with renal manifestation (HCC)   Hx of CABG   AKI (acute kidney injury) ([place])   Pneumonia with acute respiratory failure- productive cough, fevers up to 102.  T-max here 99.7.  Rules out for sepsis.  O2 sats 88% on room air currently on 2 L O2.  Normal lactic acid 1.3.  WBC 9.3.  Chest x-ray shows-pneumonia lateral right base increased from earlier in the day.  Point-of-care Covid test done at primary care provider's office was negative.   - Covid PCR ordered and pending -IV ceftriaxone 2 g daily (adjusted for weight)and azithromycin -Supplemental O2, mucolytic's -Albuterol inhaler for now -BNP elevated in the setting of worsening renal insufficiency, 713  Acute on chronic kidney injury-creatinine elevated 3.47, recent discharge creatinine 2.6.  Likely prerenal secondary to pneumonia, poor p.o. intake. -Trial of gentle hydration normal saline 75cc/hr x 1 day, if  no improvement may benefit from Lasix instead -BMP a.m.  Asthma-stable. -Continue home  albuterol inhaler  Chest pain, history of CABG 2005 subsequent stents-typical chest pain but different from her prior MI chest pains. No chest pain in the past 3 days.  Troponin elevated 384 >> 483, in the setting of worsening renal insufficiency and pneumonia.  EKG with ST abnormalities.  ED provider talked to Dr. Max Sane, no plans for immediate cardiac intervention at this time.  Patient's renal function would also limit further aggressive cardiac intervention. -Trend troponin -Repeat EKG in the morning -Resume home Isordil, hydralazine, Plavix, statins, aspirin -Consult cardiology  Peripheral and carotid artery disease-, follows with vascular surgeon Dr. Carlis Abbott.  History of endarterectomy 2014. -Resume home aspirin, statins. - patient about to start new medication pletal.  Hypertension-systolic 867E to 720N. -Resume home Norvasc, Isordil.  Controlled diabetes mellitus-random glucose 216.  HgbA1c 1/14 was 6.8. - SSI- m -Resume home 70/30 at reduced dose 18 units twice daily ( home dose 24u).  DVT prophylaxis: Heparin Code Status: Full code Family Communication: None at bedside Disposition Plan: > 2 days Consults called: cardiology Admission status: Inpatient, telemetry I certify that at the point of admission it is my clinical judgment that the patient will require inpatient hospital care spanning beyond 2 midnights from the point of admission due to high intensity of service, high risk for further deterioration and high frequency of surveillance required. The following factors support the patient status of inpatient: Acute respiratory failure requiring supplemental O2 and pneumonia.   Bethena Roys MD [facility]  12/17/2019, 7:20 PM

## 2019-12-17 NOTE — ED Provider Notes (Addendum)
[facility] EMERGENCY DEPARTMENT Provider Note   CSN: [id number] Arrival date & time: 12/17/19  1149     History Chief Complaint  Patient presents with  . Shortness of Breath    Donna Howe is a 64 y.o. female.  Patient is a 64 y/o female with PMH CAD s/p CABG several years ago and multiple PCI since then, carotid artery stenosis, PVD, quit smoking 2 years ago, CKD, diabetes presenting to he ER with SOB, chest pain since yesterday. She reports slow onset of symptoms now associated with extreme fatigue, n/v and a temporal fever at home as high as 102. Reports chest xray performed yesterday at PMD showing "fluid and pneumonia". Not on home oxygen. Reports chest pain on the left side which was radiating to the left arm but now resolved. Reports dry coughing with SOB. No known covid exposure but [place] this [place] weekend.         Past Medical History:  Diagnosis Date  . Anemia   . Asthma   . CAD (coronary artery disease)    Multivessel s/p CABG 2005, numerous PCIs since that time  . Carotid artery disease (HCC)    R CEA  . Chronic kidney disease (CKD), stage III (moderate)   . Essential hypertension   . Gout   . Hyperlipidemia   . Hypothyroidism   . Myocardial infarction ([place])   . PAD (peripheral artery disease) ([place])    Dr. Kellie Simmering  . S/P angioplasty with stent- DES to [facility] and to [facility] to LAD with DES 04/09/18.   04/10/2018  . SBO (small bowel obstruction) ([place]) 2011   Status post lysis of adhesions & hernia repair  . Sinus bradycardia   . Thrombocytopenia, unspecified ([place])   . Type 2 diabetes mellitus ([place])   . Umbilical hernia     Patient Active Problem List   Diagnosis Date Noted  . Upper airway cough syndrome 11/20/2019  . Elevated troponin 10/02/2019  . Chest pain 10/01/2019  . Unstable angina ([place]) 04/18/2018  . Hypothyroidism 04/18/2018  . S/P angioplasty with stent- DES to [place] and to [facility] to LAD with DES 04/09/18.    04/10/2018  . Angina pectoris ([place]) 04/05/2018  . Status post coronary artery stent placement   . ACS (acute coronary syndrome) ([place]) 05/31/2017  . Acute chest pain   . Non-ST elevation (NSTEMI) myocardial infarction ([place])   . History of coronary artery disease   . Diabetes ([place]) 10/28/2013  . Hypoglycemia associated with diabetes ([place]) 10/28/2013  . Thrombocytopenia, unspecified ([place]) 10/28/2013  . Hypokalemia 10/27/2013  . Syncope 10/26/2013  . Anemia 10/26/2013  . AKI (acute kidney injury) ([place]) 10/26/2013  . Fracture of toe of right foot 10/26/2013  . Umbilical hernia   . Carotid artery disease ([place])   . Occlusion and stenosis of carotid artery without mention of cerebral infarction 04/15/2013  . Hx of CABG   . Ejection fraction   . Atherosclerosis of native artery of extremity with intermittent claudication ([place]) 02/11/2013  . Diabetes mellitus with renal manifestation ([place]) 01/21/2013  . Bradycardia 01/03/2013  . Chronic kidney disease (CKD), stage III (moderate) (HCC)   . Gout   . PROTEINURIA, MILD 01/18/2010  . TOBACCO ABUSE 08/27/2009  . Obesity (BMI 30-39.9) 08/26/2009  . Asthma 08/26/2009  . Hyperlipidemia 03/31/2007  . Essential hypertension 03/31/2007  . CAD (coronary artery disease) 03/31/2007    Past Surgical History:  Procedure Laterality Date  . CESAREAN  SECTION  1984  . CHOLECYSTECTOMY  2010  . CORONARY ARTERY BYPASS GRAFT  2005  . CORONARY BALLOON ANGIOPLASTY N/A 05/31/2017   Procedure: CORONARY BALLOON ANGIOPLASTY;  Surgeon: Jettie Booze, MD;  Location: [place] [facility];  Service: Cardiovascular;  Laterality: N/A;  . CORONARY STENT INTERVENTION N/A 05/31/2017   Procedure: CORONARY STENT INTERVENTION;  Surgeon: Jettie Booze, MD;  Location: [place] [facility];  Service: Cardiovascular;  Laterality: N/A;  . CORONARY STENT INTERVENTION N/A 04/09/2018   Procedure: CORONARY STENT INTERVENTION;  Surgeon: Jettie Booze, MD;  Location: [place] [facility];  Service: Cardiovascular;  Laterality: N/A;  SVG RCA  . ENDARTERECTOMY Right 04/18/2013   Procedure: ENDARTERECTOMY CAROTID;  Surgeon: Mal Misty, MD;  Location: [place];  Service: Vascular;  Laterality: Right;  . HERNIA REPAIR  1989  . Incisional hernia repair x2  03/04/2010   Laparoscopic with 35cm mesh by Dr Ronnald Collum  . LEFT HEART CATH AND CORS/GRAFTS ANGIOGRAPHY N/A 05/31/2017   Procedure: LEFT HEART CATH AND CORS/GRAFTS ANGIOGRAPHY;  Surgeon: Jettie Booze, MD;  Location: [place] [facility];  Service: Cardiovascular;  Laterality: N/A;  . LEFT HEART CATH AND CORS/GRAFTS ANGIOGRAPHY N/A 04/08/2018   Procedure: LEFT HEART CATH AND CORS/GRAFTS ANGIOGRAPHY;  Surgeon: Jettie Booze, MD;  Location: [place] [facility];  Service: Cardiovascular;  Laterality: N/A;  . LEFT HEART CATHETERIZATION WITH CORONARY ANGIOGRAM N/A 12/19/2012   Procedure: LEFT HEART CATHETERIZATION WITH CORONARY ANGIOGRAM;  Surgeon: Josue Hector, MD;  Location: [facility] CATH LAB;  Service: Cardiovascular;  Laterality: N/A;  . LEFT HEART CATHETERIZATION WITH CORONARY/GRAFT ANGIOGRAM N/A 04/19/2013   Procedure: LEFT HEART CATHETERIZATION WITH Beatrix Fetters;  Surgeon: Lorretta Harp, MD;  Location: [facility] CATH LAB;  Service: Cardiovascular;  Laterality: N/A;  . PATCH ANGIOPLASTY Right 04/18/2013   Procedure: PATCH ANGIOPLASTY Right Internal Carotid Artery;  Surgeon: Mal Misty, MD;  Location: [place];  Service: Vascular;  Laterality: Right;  . PERCUTANEOUS CORONARY STENT INTERVENTION (PCI-S) Right 12/19/2012   Procedure: PERCUTANEOUS CORONARY STENT INTERVENTION (PCI-S);  Surgeon: Josue Hector, MD;  Location: [facility] CATH LAB;  Service: Cardiovascular;  Laterality: Right;  . SHOULDER SURGERY       OB History   No obstetric history on file.     Family History  Problem Relation Age of Onset  . Diabetes Mother   . Heart disease Mother        before age 47  . Hyperlipidemia Mother   . Hypertension  Mother   . Thyroid disease Father   . Hypertension Father   . AAA (abdominal aortic aneurysm) Father   . Heart disease Brother        before age 63  . Hypertension Brother   . Hyperlipidemia Son   . Hypertension Son     Social History   Tobacco Use  . Smoking status: Former Smoker    Packs/day: 1.00    Years: 20.00    Pack years: 20.00    Types: Cigarettes    Quit date: 12/10/2012    Years since quitting: 7.0  . Smokeless tobacco: Never Used  Substance Use Topics  . Alcohol use: No    Alcohol/week: 0.0 standard drinks  . Drug use: No    Home Medications Prior to Admission medications   Medication Sig Start Date End Date Taking? Authorizing Provider  Albuterol Sulfate 108 (90 Base) MCG/ACT AEPB Inhale 2 puffs into the lungs every 4 (four) hours as  needed (Shortness of breath).     [provider]  ALPRAZolam Duanne Moron) 0.5 MG tablet Take 0.25-0.5 mg by mouth See admin instructions. Take 0.25 mg tab by mouth every morning & evening as needed  and .05 mg tab at bedtime as needed    [provider]  amLODipine (NORVASC) 10 MG tablet Take 10 mg by mouth daily.     [provider]  aspirin EC 81 MG tablet Take 81 mg by mouth daily.     [provider]  atorvastatin (LIPITOR) 80 MG tablet Take 1 tablet (80 mg total) by mouth daily. Patient taking differently: Take 80 mg by mouth every evening.  06/01/17   Murlean Iba, MD  cilostazol (PLETAL) 100 MG tablet Take 1 tablet (100 mg total) by mouth 2 (two) times daily before a meal. 12/16/19   Marty Heck, MD  clopidogrel (PLAVIX) 75 MG tablet Take 1 tablet (75 mg total) by mouth daily. 08/27/18   Satira Sark, MD  colchicine 0.6 MG tablet Take 0.6 mg by mouth daily.     [provider]  famotidine (PEPCID) 20 MG tablet One after bfast and supper Patient taking differently: Take 20 mg by mouth daily. One after bfast and supper 11/20/19   Tanda Rockers, MD  gabapentin  (NEURONTIN) 400 MG capsule Take 1 capsule (400 mg total) by mouth 4 (four) times daily. 11/20/19   Tanda Rockers, MD  hydrALAZINE (APRESOLINE) 50 MG tablet Take 75 mg by mouth 3 (three) times daily. 06/10/19   [provider]  isosorbide dinitrate (ISORDIL) 5 MG tablet Take 5 mg by mouth 2 (two) times daily.    [provider]  levothyroxine (SYNTHROID, LEVOTHROID) 125 MCG tablet Take 125 mcg by mouth daily.     [provider]  nitroGLYCERIN (NITROSTAT) 0.4 MG SL tablet Place 0.4 mg under the tongue every 5 (five) minutes as needed for chest pain. Not to exceed 3 in 15 minute time frame    [provider]  NOVOLIN 70/30 RELION (70-30) 100 UNIT/ML injection Inject 24 Units into the skin 2 (two) times daily with a meal. Am & PM 08/28/15   [provider]  ondansetron (ZOFRAN ODT) 8 MG disintegrating tablet Take 1 tablet (8 mg total) by mouth every 8 (eight) hours as needed for nausea or vomiting. 08/14/15   Evalee Jefferson, PA-C    Allergies    Penicillins  Review of Systems   Review of Systems  Constitutional: Positive for fatigue and fever. Negative for activity change and appetite change.  HENT: Positive for congestion. Negative for sore throat.   Eyes: Negative for visual disturbance.  Respiratory: Positive for cough, shortness of breath and wheezing.   Cardiovascular: Positive for chest pain. Negative for palpitations and leg swelling.  Gastrointestinal: Positive for nausea and vomiting. Negative for abdominal pain, constipation and diarrhea.  Genitourinary: Negative for dysuria.  Musculoskeletal: Negative for back pain.  Skin: Negative for rash.  Allergic/Immunologic: Negative for immunocompromised state.  Neurological: Negative for dizziness, syncope and headaches.  Hematological: Bruises/bleeds easily.    Physical Exam Updated Vital Signs BP (!) 151/51   Pulse 78   Temp 99.7 F (37.6 C) (Oral)   Resp (!) 22   Ht 5\' 6"  (1.676 m)   Wt  101.2 kg   SpO2 (!) 88%   BMI 35.99 kg/m   Physical Exam Vitals and nursing note reviewed.  Constitutional:      General: She is not  in acute distress.    Appearance: Normal appearance. She is obese. She is not ill-appearing, toxic-appearing or diaphoretic.  HENT:     Head: Normocephalic.     Mouth/Throat:     Mouth: Mucous membranes are moist.  Eyes:     Conjunctiva/sclera: Conjunctivae normal.     Pupils: Pupils are equal, round, and reactive to light.  Cardiovascular:     Rate and Rhythm: Normal rate and regular rhythm.  Pulmonary:     Effort: Pulmonary effort is normal.     Breath sounds: Examination of the right-middle field reveals wheezing. Examination of the left-middle field reveals wheezing. Examination of the left-lower field reveals wheezing. Wheezing present. No rales.  Musculoskeletal:     Right lower leg: Edema (1+) present.     Left lower leg: Edema (1+) present.  Skin:    General: Skin is dry.  Neurological:     General: No focal deficit present.     Mental Status: She is alert.  Psychiatric:        Mood and Affect: Mood normal.     ED Results / Procedures / Treatments   Labs (all labs ordered are listed, but only abnormal results are displayed) Labs Reviewed  BRAIN NATRIURETIC PEPTIDE - Abnormal; Notable for the following components:      Result Value   B Natriuretic Peptide 713.0 (*)    All other components within normal limits  COMPREHENSIVE METABOLIC PANEL - Abnormal; Notable for the following components:   Glucose, Bld 216 (*)    BUN 41 (*)    Creatinine, Ser 3.47 (*)    Calcium 8.6 (*)    Total Protein 6.3 (*)    Albumin 2.8 (*)    Alkaline Phosphatase 140 (*)    GFR calc non Af Amer 14 (*)    GFR calc Af Amer 16 (*)    All other components within normal limits  CBC WITH DIFFERENTIAL/PLATELET - Abnormal; Notable for the following components:   RBC 3.71 (*)    Hemoglobin 10.9 (*)    HCT 35.4 (*)    Abs Immature Granulocytes 0.08 (*)     All other components within normal limits  TROPONIN I (HIGH SENSITIVITY) - Abnormal; Notable for the following components:   Troponin I (High Sensitivity) 384 (*)    All other components within normal limits  TROPONIN I (HIGH SENSITIVITY) - Abnormal; Notable for the following components:   Troponin I (High Sensitivity) 483 (*)    All other components within normal limits  CULTURE, BLOOD (ROUTINE X 2)  CULTURE, BLOOD (ROUTINE X 2)  SARS CORONAVIRUS 2 (TAT 6-24 HRS)  LACTIC ACID, PLASMA  LACTIC ACID, PLASMA    EKG EKG Interpretation  Date/Time:  Wednesday December 17 2019 13:18:56 EDT Ventricular Rate:  79 PR Interval:    QRS Duration: 105 QT Interval:  403 QTC Calculation: 462 R Axis:   77 Text Interpretation: Sinus rhythm Repol abnrm suggests ischemia, anterolateral increased ischemic changes from prior 1/21 Confirmed by Aletta Edouard [phone]) on 12/17/2019 1:36:52 PM   Radiology DG Chest Port 1 View  Result Date: 12/17/2019 CLINICAL DATA:  Shortness with cough and fever EXAM: PORTABLE CHEST 1 VIEW COMPARISON:  December 17, 2019 study obtained earlier in the day and November 20, 2019 FINDINGS: There is focal airspace opacity in the lateral right base. The lungs elsewhere are clear. Heart is slightly enlarged with pulmonary vascularity within normal limits. No adenopathy. Patient is status post internal mammary bypass grafting. No adenopathy.  No bone lesions. IMPRESSION: Airspace opacity consistent with pneumonia lateral right base, increased from earlier in the day. Lungs elsewhere clear. Stable cardiac prominence. Electronically Signed   By: Lowella Grip III M.D.   On: 12/17/2019 14:05   VAS [place] ABI WITH/WO TBI  Result Date: 12/16/2019 LOWER EXTREMITY DOPPLER STUDY Indications: Claudication, and peripheral artery disease. High Risk Factors: Hypertension, hyperlipidemia, Diabetes, past history of                    smoking, prior MI, coronary artery disease.  Comparison Study: Prior at  outside facility 08/31/2017 Right: 1.01/ 0.86 (TBI)                   and Left: 0.60/ 0.68 (TBI). Prior duplex 10/04/2017 showed a                   75-99% left SFA stenosis. Performing Technologist: Delorise Shiner RVT  Examination Guidelines: A complete evaluation includes at minimum, Doppler waveform signals and systolic blood pressure reading at the level of bilateral brachial, anterior tibial, and posterior tibial arteries, when vessel segments are accessible. Bilateral testing is considered an integral part of a complete examination. Photoelectric Plethysmograph (PPG) waveforms and toe systolic pressure readings are included as required and additional duplex testing as needed. Limited examinations for reoccurring indications may be performed as noted.  ABI Findings: +---------+------------------+-----+--------+--------+ Right    Rt Pressure (mmHg)IndexWaveformComment  +---------+------------------+-----+--------+--------+ Brachial 168                                     +---------+------------------+-----+--------+--------+ ATA      126               0.75                  +---------+------------------+-----+--------+--------+ PTA      123               0.73 biphasic         +---------+------------------+-----+--------+--------+ DP                              biphasic         +---------+------------------+-----+--------+--------+ Great Toe93                0.55                  +---------+------------------+-----+--------+--------+ +---------+------------------+-----+---------+-------+ Left     Lt Pressure (mmHg)IndexWaveform Comment +---------+------------------+-----+---------+-------+ Brachial 146                                     +---------+------------------+-----+---------+-------+ ATA      115               0.68                  +---------+------------------+-----+---------+-------+ PTA      114               0.68 biphasic          +---------+------------------+-----+---------+-------+ DP                              triphasic        +---------+------------------+-----+---------+-------+ Renato Gails  0.38                  +---------+------------------+-----+---------+-------+ +-------+-----------+-----------+------------+------------+ ABI/TBIToday's ABIToday's TBIPrevious ABIPrevious TBI +-------+-----------+-----------+------------+------------+ Right  0.75       0.55                                +-------+-----------+-----------+------------+------------+ Left   0.68       0.38                                +-------+-----------+-----------+------------+------------+  Summary: Right: Resting right ankle-brachial index indicates moderate right lower extremity arterial disease. The right toe-brachial index is abnormal. RT great toe pressure = 93 mmHg. Left: Resting left ankle-brachial index indicates moderate left lower extremity arterial disease. The left toe-brachial index is abnormal. LT Great toe pressure = 63 mmHg.  *See table(s) above for measurements and observations.  Electronically signed by Monica Martinez MD on 12/16/2019 at 10:07:41 AM.    Final    VAS US CAROTID  Result Date: 12/16/2019 Carotid Arterial Duplex Study Indications:       Carotid artery disease, right endarterectomy and Right                    carotid endarterestomy08/09/2012. Risk Factors:      Hypertension, hyperlipidemia, Diabetes, past history of                    smoking, prior MI, coronary artery disease, PAD. Comparison Study:  08/04/2015 Carotid duplex showed a patent right carotid                    endarterectomy site and <61% LICA stenosis. Performing Technologist: Delorise Shiner RVT  Examination Guidelines: A complete evaluation includes B-mode imaging, spectral Doppler, color Doppler, and power Doppler as needed of all accessible portions of each vessel. Bilateral testing is considered an integral part  of a complete examination. Limited examinations for reoccurring indications may be performed as noted.  Right Carotid Findings: +----------+--------+--------+--------+--------------------------+--------+           PSV cm/sEDV cm/sStenosisPlaque Description        Comments +----------+--------+--------+--------+--------------------------+--------+ CCA Prox  118     27                                                 +----------+--------+--------+--------+--------------------------+--------+ CCA Mid   113     27                                                 +----------+--------+--------+--------+--------------------------+--------+ CCA Distal80      24              irregular and heterogenous         +----------+--------+--------+--------+--------------------------+--------+ ICA Prox  177     70      60-79%  homogeneous and smooth             +----------+--------+--------+--------+--------------------------+--------+ ICA Mid   196     56                                                 +----------+--------+--------+--------+--------------------------+--------+  ICA Distal171     66                                                 +----------+--------+--------+--------+--------------------------+--------+ ECA       337     0       >50%                                       +----------+--------+--------+--------+--------------------------+--------+ +----------+--------+-------+----------------+-------------------+           PSV cm/sEDV cmsDescribe        Arm Pressure (mmHG) +----------+--------+-------+----------------+-------------------+ [id number]      0      Multiphasic, WNL                    +----------+--------+-------+----------------+-------------------+ +---------+--------+--+--------+--+---------+ VertebralPSV cm/s79EDV cm/s27Antegrade +---------+--------+--+--------+--+---------+  Left Carotid Findings:  +----------+--------+--------+--------+-------------------------+--------+           PSV cm/sEDV cm/sStenosisPlaque Description       Comments +----------+--------+--------+--------+-------------------------+--------+ CCA Prox  136     23              focal                             +----------+--------+--------+--------+-------------------------+--------+ CCA Mid   89      19                                                +----------+--------+--------+--------+-------------------------+--------+ CCA Distal118     29              irregular and homogeneous         +----------+--------+--------+--------+-------------------------+--------+ ICA Prox  128     33      1-39%   irregular                         +----------+--------+--------+--------+-------------------------+--------+ ICA Mid   128     36                                                +----------+--------+--------+--------+-------------------------+--------+ ICA Distal103     32                                                +----------+--------+--------+--------+-------------------------+--------+ ECA       123     15                                                +----------+--------+--------+--------+-------------------------+--------+ +----------+--------+--------+----------------+-------------------+           PSV cm/sEDV cm/sDescribe        Arm Pressure (mmHG) +----------+--------+--------+----------------+-------------------+ [id number]     0  Multiphasic, WNL                    +----------+--------+--------+----------------+-------------------+ +---------+--------+--+--------+--+---------+ VertebralPSV cm/s33EDV cm/s12Antegrade +---------+--------+--+--------+--+---------+   Summary: Right Carotid: Velocities in the right ICA are consistent with a 60-79%                stenosis. The ECA appears >50% stenosed. Left Carotid: Velocities in the left ICA are consistent  with a 1-39% stenosis. Vertebrals: Bilateral vertebral arteries demonstrate antegrade flow. *See table(s) above for measurements and observations.  Electronically signed by Monica Martinez MD on 12/16/2019 at 5:12:06 PM.    Final     Procedures Procedures (including critical care time)  Medications Ordered in ED Medications  cefTRIAXone (ROCEPHIN) 1 g in sodium chloride 0.9 % 100 mL IVPB (0 g Intravenous Stopped 12/17/19 1558)  albuterol (VENTOLIN HFA) 108 (90 Base) MCG/ACT inhaler 4 puff (4 puffs Inhalation Given 12/17/19 1513)  aspirin chewable tablet 324 mg (324 mg Oral Given 12/17/19 1546)    ED Course  I have reviewed the triage vital signs and the nursing notes.  Pertinent labs & imaging results that were available during my care of the patient were reviewed by me and considered in my medical decision making (see chart for details).  Clinical Course as of Dec 16 1700  Wed Dec 17, 2019  1331 Patient with chest pain, SOB and fever at home for 2 days. On arrival overall she is well appearing but does have wheezing and harsh cough on exam. Covid/septic   [KM]  3357 64 year old female coming from her primary care doctor's office being evaluated for fever shortness of breath.  Had an episode of exertional chest pain yesterday.  No chest pain now.  Reported fever was up to 102 although 997 here.  Looks in no distress.  Getting lab work chest x-ray EKG.  Disposition per results of testing.   [MB]  5009 Workup revealing worsening pneumonia. Patient did dsat at rest on RA to 88%. Rapid covid at PMD was negative, 6hour test is pending. WBC  is normal and does not appear septic. CKD at baseline but worsening function today. Also, patient with significant elevation in her trop from [phone]. Patient declined any chest pain here in the ED. Did endorse exertional CP yesterday and some pain with coughing. BNP also elevated in 700s. Patient still appears in NAD. Awaiting cardiology consult. Will need  admission and tx for CAP but also would appreciate cards input.    [KM]  1700 I did speak with Dr. Jacinta Shoe who feels patient can be appropriately treated here at [facility] for her CAP an no immediate cardiology intervention needed at this time. Will consult hospitalist.    [KM]    Clinical Course User Index [KM] Alveria Apley, PA-C [MB] Hayden Rasmussen, MD   MDM Rules/Calculators/A&P                     The patient appears reasonably stabilized for admission considering the current resources, flow, and capabilities available in the ED at this time, and I doubt any other [facility] requiring further screening and/or treatment in the ED prior to admission.   Final Clinical Impression(s) / ED Diagnoses Final diagnoses:  Acute respiratory distress  Community acquired pneumonia of right lung, unspecified part of lung  AKI (acute kidney injury) ([place])  Elevated troponin    Rx / DC Orders ED Discharge Orders    None       Aundra Dubin,  Floyce Stakes, PA-C 12/17/19 1702    Alveria Apley, PA-C 12/17/19 1702    Hayden Rasmussen, MD 12/17/19 1924

## 2019-12-17 NOTE — ED Notes (Signed)
Sats 88% on RA while lying in bed.  Placed on O2@2L /M via [place].  Sats increased to 98%.

## 2019-12-17 NOTE — ED Notes (Signed)
Pt given a dinner tray at this time

## 2019-12-17 NOTE — ED Notes (Signed)
Date and time results received: 12/17/19 1645 (use smartphrase ".now" to insert current time)  Test: troponin Critical Value: 483  Name of Provider Notified: [email or web address],md  Orders Received? Or Actions Taken?:

## 2019-12-18 ENCOUNTER — Ambulatory Visit: Payer: Medicare HMO | Admitting: Family Medicine

## 2019-12-18 DIAGNOSIS — I10 Essential (primary) hypertension: Secondary | ICD-10-CM

## 2019-12-18 DIAGNOSIS — Z951 Presence of aortocoronary bypass graft: Secondary | ICD-10-CM

## 2019-12-18 DIAGNOSIS — J45909 Unspecified asthma, uncomplicated: Secondary | ICD-10-CM

## 2019-12-18 DIAGNOSIS — N1832 Chronic kidney disease, stage 3b: Secondary | ICD-10-CM

## 2019-12-18 DIAGNOSIS — J189 Pneumonia, unspecified organism: Principal | ICD-10-CM

## 2019-12-18 DIAGNOSIS — N179 Acute kidney failure, unspecified: Secondary | ICD-10-CM

## 2019-12-18 LAB — URINALYSIS, ROUTINE W REFLEX MICROSCOPIC
Bilirubin Urine: NEGATIVE
Glucose, UA: 50 mg/dL — AB
Hgb urine dipstick: NEGATIVE
Ketones, ur: NEGATIVE mg/dL
Leukocytes,Ua: NEGATIVE
Nitrite: NEGATIVE
Protein, ur: >=300 mg/dL — AB
Specific Gravity, Urine: 1.016 (ref 1.005–1.030)
pH: 5 (ref 5.0–8.0)

## 2019-12-18 LAB — CBC
HCT: 32.1 % — ABNORMAL LOW (ref 36.0–46.0)
Hemoglobin: 9.8 g/dL — ABNORMAL LOW (ref 12.0–15.0)
MCH: 29.8 pg (ref 26.0–34.0)
MCHC: 30.5 g/dL (ref 30.0–36.0)
MCV: 97.6 fL (ref 80.0–100.0)
Platelets: 157 10*3/uL (ref 150–400)
RBC: 3.29 MIL/uL — ABNORMAL LOW (ref 3.87–5.11)
RDW: 14.6 % (ref 11.5–15.5)
WBC: 7.7 10*3/uL (ref 4.0–10.5)
nRBC: 0 % (ref 0.0–0.2)

## 2019-12-18 LAB — GLUCOSE, CAPILLARY
Glucose-Capillary: 117 mg/dL — ABNORMAL HIGH (ref 70–99)
Glucose-Capillary: 127 mg/dL — ABNORMAL HIGH (ref 70–99)
Glucose-Capillary: 131 mg/dL — ABNORMAL HIGH (ref 70–99)
Glucose-Capillary: 142 mg/dL — ABNORMAL HIGH (ref 70–99)
Glucose-Capillary: 148 mg/dL — ABNORMAL HIGH (ref 70–99)

## 2019-12-18 LAB — BASIC METABOLIC PANEL
Anion gap: 10 (ref 5–15)
BUN: 42 mg/dL — ABNORMAL HIGH (ref 8–23)
CO2: 21 mmol/L — ABNORMAL LOW (ref 22–32)
Calcium: 8.5 mg/dL — ABNORMAL LOW (ref 8.9–10.3)
Chloride: 111 mmol/L (ref 98–111)
Creatinine, Ser: 3.18 mg/dL — ABNORMAL HIGH (ref 0.44–1.00)
GFR calc Af Amer: 17 mL/min — ABNORMAL LOW (ref >60)
GFR calc non Af Amer: 15 mL/min — ABNORMAL LOW (ref >60)
Glucose, Bld: 106 mg/dL — ABNORMAL HIGH (ref 70–99)
Potassium: 4.4 mmol/L (ref 3.5–5.1)
Sodium: 142 mmol/L (ref 135–145)

## 2019-12-18 LAB — SARS CORONAVIRUS 2 (TAT 6-24 HRS): SARS Coronavirus 2: NEGATIVE

## 2019-12-18 MED ORDER — FAMOTIDINE 20 MG PO TABS
10.0000 mg | ORAL_TABLET | Freq: Every day | ORAL | Status: DC
  Administered 2019-12-19: 10 mg via ORAL
  Filled 2019-12-18: qty 1

## 2019-12-18 MED ORDER — HYDRALAZINE HCL 25 MG PO TABS
75.0000 mg | ORAL_TABLET | Freq: Two times a day (BID) | ORAL | Status: DC
  Administered 2019-12-19: 75 mg via ORAL
  Filled 2019-12-18: qty 3

## 2019-12-18 MED ORDER — GABAPENTIN 400 MG PO CAPS: 400.0000 mg | ORAL_CAPSULE | Freq: Every day | ORAL | Status: DC

## 2019-12-18 NOTE — Consult Note (Addendum)
[facility] Nephrology Consultation Note  Requesting MD: Dr Wynetta Emery, C Reason for consult: AKI on CKD  HPI:  Donna Howe is a 64 y.o. female with history of hypertension, HLD, DM, PAD, CAD, status post CABG, CKD, presented with chest pain shortness of breath and cough, seen as a consultation at the request of Dr. Wynetta Emery for AKI on CKD. The serum creatinine level was around 1.7-2.2 in 2019 however it was elevated to around 2.7-3 this year.  She was seen by Dr. Johnney Ou at [facility] office on 2/24 when serological evaluation was unremarkable. Subsequently, she underwent kidney biopsy done on 3/10.  Patient missed outpatient follow-up appointment.  The biopsy result came back as evidence of early diabetic glomerulopathy and advanced global glomerulosclerosis and diffuse severe interstitial fibrosis and tubular atrophy.  In the ER, her temperature was 99.7, blood pressure 152/52, required 2 to 3 L of oxygen.  The initial lab showed a creatinine level of 3.47, BUN 41, potassium 4.2, BNP 713 and WBC 9.2.  The chest x-ray consistent with right base opacity consistent with pneumonia.  She was a started on azithromycin, ceftriaxone for the treatment of pneumonia.  Also on IV fluid.  Cardiologist was consulted for elevated troponin associated with abnormal EKG.  Denied fever, nausea, vomiting, chest pain, shortness of breath.  Patient denied dysuria, urgency, frequency, pelvic or flank pain.  No use of NSAIDs.  She is not on diuretics at home.  Today the creatinine level improved to 3.1.   PMHx:   Past Medical History:  Diagnosis Date  . Anemia   . Asthma   . CAD (coronary artery disease)    Multivessel s/p CABG 2005, numerous PCIs since that time  . Carotid artery disease (HCC)    R CEA  . Chronic kidney disease (CKD), stage III (moderate)   . Essential hypertension   . Gout   . Hyperlipidemia   . Hypothyroidism   . Myocardial infarction ([place])   . PAD (peripheral  artery disease) ([place])    Dr. Kellie Simmering  . S/P angioplasty with stent- DES to [facility] and to [facility] to LAD with DES 04/09/18.   04/10/2018  . SBO (small bowel obstruction) ([place]) 2011   Status post lysis of adhesions & hernia repair  . Sinus bradycardia   . Thrombocytopenia, unspecified ([place])   . Type 2 diabetes mellitus ([place])   . Umbilical hernia     Past Surgical History:  Procedure Laterality Date  . CESAREAN SECTION  1984  . CHOLECYSTECTOMY  2010  . CORONARY ARTERY BYPASS GRAFT  2005  . CORONARY BALLOON ANGIOPLASTY N/A 05/31/2017   Procedure: CORONARY BALLOON ANGIOPLASTY;  Surgeon: Jettie Booze, MD;  Location: [place] [facility];  Service: Cardiovascular;  Laterality: N/A;  . CORONARY STENT INTERVENTION N/A 05/31/2017   Procedure: CORONARY STENT INTERVENTION;  Surgeon: Jettie Booze, MD;  Location: [place] [facility];  Service: Cardiovascular;  Laterality: N/A;  . CORONARY STENT INTERVENTION N/A 04/09/2018   Procedure: CORONARY STENT INTERVENTION;  Surgeon: Jettie Booze, MD;  Location: [place] [facility];  Service: Cardiovascular;  Laterality: N/A;  SVG RCA  . ENDARTERECTOMY Right 04/18/2013   Procedure: ENDARTERECTOMY CAROTID;  Surgeon: Mal Misty, MD;  Location: [place];  Service: Vascular;  Laterality: Right;  . HERNIA REPAIR  1989  . Incisional hernia repair x2  03/04/2010   Laparoscopic with 35cm mesh by Dr Ronnald Collum  . LEFT HEART CATH AND CORS/GRAFTS ANGIOGRAPHY N/A 05/31/2017  Procedure: LEFT HEART CATH AND CORS/GRAFTS ANGIOGRAPHY;  Surgeon: Jettie Booze, MD;  Location: [place] [facility];  Service: Cardiovascular;  Laterality: N/A;  . LEFT HEART CATH AND CORS/GRAFTS ANGIOGRAPHY N/A 04/08/2018   Procedure: LEFT HEART CATH AND CORS/GRAFTS ANGIOGRAPHY;  Surgeon: Jettie Booze, MD;  Location: [place] [facility];  Service: Cardiovascular;  Laterality: N/A;  . LEFT HEART CATHETERIZATION WITH CORONARY ANGIOGRAM N/A 12/19/2012   Procedure: LEFT HEART  CATHETERIZATION WITH CORONARY ANGIOGRAM;  Surgeon: Josue Hector, MD;  Location: [facility] LAB;  Service: Cardiovascular;  Laterality: N/A;  . LEFT HEART CATHETERIZATION WITH CORONARY/GRAFT ANGIOGRAM N/A 04/19/2013   Procedure: LEFT HEART CATHETERIZATION WITH Beatrix Fetters;  Surgeon: Lorretta Harp, MD;  Location: [facility] CATH LAB;  Service: Cardiovascular;  Laterality: N/A;  . PATCH ANGIOPLASTY Right 04/18/2013   Procedure: PATCH ANGIOPLASTY Right Internal Carotid Artery;  Surgeon: Mal Misty, MD;  Location: [place];  Service: Vascular;  Laterality: Right;  . PERCUTANEOUS CORONARY STENT INTERVENTION (PCI-S) Right 12/19/2012   Procedure: PERCUTANEOUS CORONARY STENT INTERVENTION (PCI-S);  Surgeon: Josue Hector, MD;  Location: [facility] C[facility] LAB;  Service: Cardiovascular;  Laterality: Right;  . SHOULDER SURGERY      Family Hx:  Family History  Problem Relation Age of Onset  . Diabetes Mother   . Heart disease Mother        before age 57  . Hyperlipidemia Mother   . Hypertension Mother   . Thyroid disease Father   . Hypertension Father   . AAA (abdominal aortic aneurysm) Father   . Heart disease Brother        before age 49  . Hypertension Brother   . Hyperlipidemia Son   . Hypertension Son     Social History:  reports that she quit smoking about 7 years ago. Her smoking use included cigarettes. She has a 20.00 pack-year smoking history. She has never used smokeless tobacco. She reports that she does not drink alcohol or use drugs.  Allergies:  Allergies  Allergen Reactions  . Penicillins Other (See Comments)    REACTION: Unknown, told as a child Has patient had a PCN reaction causing immediate rash, facial/tongue/throat swelling, SOB or lightheadedness with hypotension: Unknown Has patient had a PCN reaction causing severe rash involving mucus membranes or skin necrosis: Unknown Has patient had a PCN reaction that required hospitalization: Unknown Has patient had a PCN reaction  occurring within the last 10 years: No If all of the above answers are "NO", then may proceed with Cephalosporin use.     Medications: Prior to Admission medications   Medication Sig Start Date End Date Taking? Authorizing Provider  Albuterol Sulfate 108 (90 Base) MCG/ACT AEPB Inhale 2 puffs into the lungs every 4 (four) hours as needed (Shortness of breath).    Yes [provider]  ALPRAZolam Duanne Moron) 0.5 MG tablet Take 0.25-0.5 mg by mouth See admin instructions. Take 0.25 mg tab by mouth every morning & evening as needed  and .05 mg tab at bedtime   Yes [provider]  amLODipine (NORVASC) 10 MG tablet Take 10 mg by mouth daily.    Yes [provider]  aspirin EC 81 MG tablet Take 81 mg by mouth daily.    Yes [provider]  atorvastatin (LIPITOR) 80 MG tablet Take 1 tablet (80 mg total) by mouth daily. Patient taking differently: Take 80 mg by mouth every evening.  06/01/17  Yes Johnson, Clanford L, MD  clopidogrel (PLAVIX) 75  MG tablet Take 1 tablet (75 mg total) by mouth daily. 08/27/18  Yes Satira Sark, MD  colchicine 0.6 MG tablet Take 0.6 mg by mouth daily.    Yes [provider]  famotidine (PEPCID) 20 MG tablet One after bfast and supper Patient taking differently: Take 20 mg by mouth 2 (two) times daily. One after bfast and supper 11/20/19  Yes Tanda Rockers, MD  gabapentin (NEURONTIN) 400 MG capsule Take 1 capsule (400 mg total) by mouth 4 (four) times daily. 11/20/19  Yes Tanda Rockers, MD  hydrALAZINE (APRESOLINE) 50 MG tablet Take 75 mg by mouth in the morning and at bedtime.  06/10/19  Yes [provider]  isosorbide dinitrate (ISORDIL) 5 MG tablet Take 5 mg by mouth 2 (two) times daily.   Yes [provider]  levothyroxine (SYNTHROID, LEVOTHROID) 125 MCG tablet Take 125 mcg by mouth daily.    Yes [provider]  nitroGLYCERIN (NITROSTAT) 0.4 MG SL tablet Place 0.4 mg under the tongue every 5 (five)  minutes as needed for chest pain. Not to exceed 3 in 15 minute time frame   Yes [provider]  NOVOLIN 70/30 RELION (70-30) 100 UNIT/ML injection Inject 24 Units into the skin 2 (two) times daily with a meal. Am & PM 08/28/15  Yes [provider]  ondansetron (ZOFRAN ODT) 8 MG disintegrating tablet Take 1 tablet (8 mg total) by mouth every 8 (eight) hours as needed for nausea or vomiting. 08/14/15  Yes Idol, Almyra Free, PA-C  cilostazol (PLETAL) 100 MG tablet Take 1 tablet (100 mg total) by mouth 2 (two) times daily before a meal. 12/16/19   Marty Heck, MD    I have reviewed the patient's current medications.  Labs:  Results for orders placed or performed during the hospital encounter of 12/17/19 (from the past 48 hour(s))  Brain natriuretic peptide     Status: Abnormal   Collection Time: 12/17/19  1:29 PM  Result Value Ref Range   B Natriuretic Peptide 713.0 (H) 0.0 - 100.0 pg/mL    Comment: Performed at [facility], [address], [place], [place] [address]  Troponin I (High Sensitivity)     Status: Abnormal   Collection Time: 12/17/19  1:29 PM  Result Value Ref Range   Troponin I (High Sensitivity) 384 (HH) <18 ng/L    Comment: CRITICAL RESULT CALLED TO, READ BACK BY AND VERIFIED WITH: WESTON,L AT 1518 ON 3.18.21 BY ISLEY,B (NOTE) Elevated high sensitivity troponin I (hsTnI) values and significant  changes across serial measurements may suggest ACS but many other  chronic and acute conditions are known to elevate hsTnI results.  Refer to the Links section for chest pain algorithms and additional  guidance. Performed at [facility], [address], [place], [place] [address]   Comprehensive metabolic panel     Status: Abnormal   Collection Time: 12/17/19  1:29 PM  Result Value Ref Range   Sodium 137 135 - 145 mmol/L   Potassium 4.2 3.5 - 5.1 mmol/L   Chloride 105 98 - 111 mmol/L   CO2 22 22 - 32 mmol/L   Glucose, Bld 216 (H) 70 - 99 mg/dL    Comment:  Glucose reference range applies only to samples taken after fasting for at least 8 hours.   BUN 41 (H) 8 - 23 mg/dL   Creatinine, Ser 3.47 (H) 0.44 - 1.00 mg/dL   Calcium 8.6 (L) 8.9 - 10.3 mg/dL   Total Protein 6.3 (  L) 6.5 - 8.1 g/dL   Albumin 2.8 (L) 3.5 - 5.0 g/dL   AST 18 15 - 41 U/L   ALT 26 0 - 44 U/L   Alkaline Phosphatase 140 (H) 38 - 126 U/L   Total Bilirubin 0.6 0.3 - 1.2 mg/dL   GFR calc non Af Amer 14 (L) >60 mL/min   GFR calc Af Amer 16 (L) >60 mL/min   Anion gap 10 5 - 15    Comment: Performed at [facility], [address], [place], [place] [address]  Lactic acid, plasma     Status: None   Collection Time: 12/17/19  1:41 PM  Result Value Ref Range   Lactic Acid, Venous 1.3 0.5 - 1.9 mmol/L    Comment: Performed at [facility], [address] [place], [place] [address]  Blood culture (routine x 2)     Status: None (Preliminary result)   Collection Time: 12/17/19  1:41 PM   Specimen: Left Antecubital; Blood  Result Value Ref Range   Specimen Description LEFT ANTECUBITAL    Special Requests      BOTTLES DRAWN AEROBIC AND ANAEROBIC Blood Culture adequate volume   Culture      NO GROWTH < 24 HOURS Performed at [facility], [address] [place], [place] [address]    Report Status PENDING   Blood culture (routine x 2)     Status: None (Preliminary result)   Collection Time: 12/17/19  1:41 PM   Specimen: BLOOD LEFT HAND  Result Value Ref Range   Specimen Description BLOOD LEFT HAND    Special Requests      BOTTLES DRAWN AEROBIC AND ANAEROBIC Blood Culture adequate volume   Culture      NO GROWTH < 24 HOURS Performed at [facility], [address], [place], [place] [address]    Report Status PENDING   Lactic acid, plasma     Status: None   Collection Time: 12/17/19  3:23 PM  Result Value Ref Range   Lactic Acid, Venous 1.1 0.5 - 1.9 mmol/L    Comment: Performed at [facility], [address] [place],  [address]  Troponin I (High Sensitivity)      Status: Abnormal   Collection Time: 12/17/19  3:23 PM  Result Value Ref Range   Troponin I (High Sensitivity) 483 (HH) <18 ng/L    Comment: CRITICAL RESULT CALLED TO, READ BACK BY AND VERIFIED WITH: WESTON,L AT 1639 ON 3.31.21 BY ISLEY,B (NOTE) Elevated high sensitivity troponin I (hsTnI) values and significant  changes across serial measurements may suggest ACS but many other  chronic and acute conditions are known to elevate hsTnI results.  Refer to the Links section for chest pain algorithms and additional  guidance. Performed at [facility], [address] [place],  [address]   CBC with Differential     Status: Abnormal   Collection Time: 12/17/19  3:23 PM  Result Value Ref Range   WBC 9.2 4.0 - 10.5 K/uL   RBC 3.71 (L) 3.87 - 5.11 MIL/uL   Hemoglobin 10.9 (L) 12.0 - 15.0 g/dL   HCT 35.4 (L) 36.0 - 46.0 %   MCV 95.4 80.0 - 100.0 fL   MCH 29.4 26.0 - 34.0 pg   MCHC 30.8 30.0 - 36.0 g/dL   RDW 14.4 11.5 - 15.5 %   Platelets 168 150 - 400 K/uL   nRBC 0.0 0.0 - 0.2 %   Neutrophils Relative % 67 %   Neutro Abs 6.3  1.7 - 7.7 K/uL   Lymphocytes Relative 21 %   Lymphs Abs 1.9 0.7 - 4.0 K/uL   Monocytes Relative 9 %   Monocytes Absolute 0.8 0.1 - 1.0 K/uL   Eosinophils Relative 1 %   Eosinophils Absolute 0.1 0.0 - 0.5 K/uL   Basophils Relative 1 %   Basophils Absolute 0.1 0.0 - 0.1 K/uL   Immature Granulocytes 1 %   Abs Immature Granulocytes 0.08 (H) 0.00 - 0.07 K/uL    Comment: Performed at [facility], [address] [place], [place] [address]  SARS CORONAVIRUS 2 (TAT 6-24 HRS) Nasopharyngeal Nasopharyngeal Swab     Status: None   Collection Time: 12/17/19  3:53 PM   Specimen: Nasopharyngeal Swab  Result Value Ref Range   SARS Coronavirus 2 NEGATIVE NEGATIVE    Comment: (NOTE) SARS-CoV-2 target nucleic acids are NOT DETECTED. The SARS-CoV-2 RNA is generally detectable in upper and lower respiratory specimens during the acute phase of infection. Negative results  do not preclude SARS-CoV-2 infection, do not rule out co-infections with other pathogens, and should not be used as the sole basis for treatment or other patient management decisions. Negative results must be combined with clinical observations, patient history, and epidemiological information. The expected result is Negative. Fact Sheet for Patients: SugarRoll.be Fact Sheet for Healthcare Providers: https://www.woods-mathews.com/ This test is not yet approved or cleared by the [place] FDA and  has been authorized for detection and/or diagnosis of SARS-CoV-2 by FDA under an Emergency Use Authorization (EUA). This EUA will remain  in effect (meaning this test can be used) for the duration of the COVID-19 declaration under Section 56 4(b)(1) of the Act, 21 U.S.C. section 360bbb-3(b)(1), unless the authorization is terminated or revoked sooner. Performed at [facility], [place] [address] [place], [place] [address]   Glucose, capillary     Status: Abnormal   Collection Time: 12/17/19  8:21 PM  Result Value Ref Range   Glucose-Capillary 170 (H) 70 - 99 mg/dL    Comment: Glucose reference range applies only to samples taken after fasting for at least 8 hours.  Troponin I (High Sensitivity)     Status: Abnormal   Collection Time: 12/17/19  8:57 PM  Result Value Ref Range   Troponin I (High Sensitivity) 559 (HH) <18 ng/L    Comment: CRITICAL RESULT CALLED TO, READ BACK BY AND VERIFIED WITH: MAYO,RN AT 2153 ON 3.31.21 BY ISLEY,B (NOTE) Elevated high sensitivity troponin I (hsTnI) values and significant  changes across serial measurements may suggest ACS but many other  chronic and acute conditions are known to elevate hsTnI results.  Refer to the Links section for chest pain algorithms and additional  guidance. Performed at [facility], [address], [place], [place] [address]   Glucose, capillary     Status: Abnormal   Collection  Time: 12/18/19 12:09 AM  Result Value Ref Range   Glucose-Capillary 127 (H) 70 - 99 mg/dL    Comment: Glucose reference range applies only to samples taken after fasting for at least 8 hours.  Basic metabolic panel     Status: Abnormal   Collection Time: 12/18/19  4:44 AM  Result Value Ref Range   Sodium 142 135 - 145 mmol/L   Potassium 4.4 3.5 - 5.1 mmol/L   Chloride 111 98 - 111 mmol/L   CO2 21 (L) 22 - 32 mmol/L   Glucose, Bld 106 (H) 70 - 99 mg/dL    Comment: Glucose reference range applies  only to samples taken after fasting for at least 8 hours.   BUN 42 (H) 8 - 23 mg/dL   Creatinine, Ser 3.18 (H) 0.44 - 1.00 mg/dL   Calcium 8.5 (L) 8.9 - 10.3 mg/dL   GFR calc non Af Amer 15 (L) >60 mL/min   GFR calc Af Amer 17 (L) >60 mL/min   Anion gap 10 5 - 15    Comment: Performed at [facility], [address], [place], [place] [address]  CBC     Status: Abnormal   Collection Time: 12/18/19  4:44 AM  Result Value Ref Range   WBC 7.7 4.0 - 10.5 K/uL   RBC 3.29 (L) 3.87 - 5.11 MIL/uL   Hemoglobin 9.8 (L) 12.0 - 15.0 g/dL   HCT 32.1 (L) 36.0 - 46.0 %   MCV 97.6 80.0 - 100.0 fL   MCH 29.8 26.0 - 34.0 pg   MCHC 30.5 30.0 - 36.0 g/dL   RDW 14.6 11.5 - 15.5 %   Platelets 157 150 - 400 K/uL   nRBC 0.0 0.0 - 0.2 %    Comment: Performed at [facility], [address] [place], [place] [address]  Glucose, capillary     Status: Abnormal   Collection Time: 12/18/19  6:20 AM  Result Value Ref Range   Glucose-Capillary 131 (H) 70 - 99 mg/dL    Comment: Glucose reference range applies only to samples taken after fasting for at least 8 hours.     ROS:  Pertinent items noted in HPI and remainder of comprehensive ROS otherwise negative.  Physical Exam: Vitals:   12/18/19 0618 12/18/19 0914  BP: (!) 131/53 (!) 150/52  Pulse: 72 72  Resp: 17 18  Temp: 98 F (36.7 C) 98 F (36.7 C)  SpO2: 95% 92%     General exam: Appears calm and comfortable  Respiratory system: Bibasal coarse breath  sound, no wheezing. Cardiovascular system: S1 & S2 heard, RRR.  No pedal edema. Gastrointestinal system: Abdomen is nondistended, soft and nontender. Normal bowel sounds heard. Central nervous system: Alert and oriented. No focal neurological deficits. Extremities: Symmetric 5 x 5 power. Skin: No rashes, lesions or ulcers Psychiatry: Judgement and insight appear normal. Mood & affect appropriate.   Assessment/Plan:  #Acute kidney injury on CKD stage III likely hemodynamically mediated in the setting of pneumonia/decreased oral intake. Nonoliguric. The creatinine level already improving with IV hydration.  I will repeat urinalysis, bladder scan.  Recent kidney ultrasound with increased renal echogenicity.  Monitor BMP, avoid nephrotoxins. Patient is not uremic.  No need for dialysis.  #CKD stage III with recent baseline creatinine level around 2.7-3 due to biopsy-proven diabetic nephropathy and severe IFTA. Patient recently had biopsy done on 3/10.  Patient was informed of the biopsy report.  She will need outpatient follow-up.  #Community-acquired pneumonia/hypoxia: Currently on azithromycin and ceftriaxone per primary team.  Still having some shortness of breath.  Bronchodilators prn.  Per primary team.  #CAD status post CABG: Chest pain.  Patient has elevated troponin and abnormal EKG.  Per cardiology.  #Hypertension: Resume home medications.  Volume status acceptable.  Thank you for the consult.  We will follow with you. Discussed with primary team.  Rosita Fire 12/18/2019, 11:29 AM  [facility].

## 2019-12-18 NOTE — Progress Notes (Signed)
PROGRESS NOTE [facility]   Donna Howe  CBJ:[id number]  DOB: 11/01/1955  DOA: 12/17/2019 PCP: [facility], [facility]   Brief Admission Hx: 64 year old female with stage III CKD, hypertension, CAD status post CABG, diabetes mellitus and peripheral artery disease presented complaining of shortness of breath and diagnosed with pneumonia.  MDM/Assessment & Plan:   1. Acute respiratory failure with hypoxia secondary to pneumonia-patient is slowly improving with treatments.  Continue IV antibiotics.  Supplemental oxygen ordered.  Continue mucolytics and bronchodilators as ordered.  Covid test negative. 2. Acute renal failure on superimposed CKD stage IIIb-patient reports that she recently had a renal biopsy done consistent with diabetic nephropathy.  She has been treated with gentle IV fluid hydration and some improvement noted.  Nephrology consult requested. 3. Asthma-she has been resumed on bronchodilator therapy.  Follow clinically. 4. Atypical chest pain-secondary to pneumonia.  Symptoms suggestive of pleuritic chest pain.  Symptoms resolved this morning.  Continue home cardiac medications. 5. Peripheral artery disease and carotid artery disease-patient was recently seen by vascular surgery and they plan to follow her closely.  She will be seen in the next 6 months.  She does have claudication symptoms and they are considering starting Pletal. 6. Essential hypertension-stable on home medications follow-up. 7. Type 2 diabetes mellitus, controlled.  Most recent A1c 6.8%.  She is on supplemental sliding scale insulin and home 70/30 insulin.  DVT prophylaxis: heparin  Code Status: Full  Family Communication: updated patient regarding plan of care, verbalized understanding  Disposition Plan: continue IV antibiotics and IV fluids for AKI, obtain PT eval and start weaning oxygen.    Consultants:  Nephrology   Procedures:    Antimicrobials:     Subjective: Pt reports  that she is much less SOB today and has no chest pain.  No fever or chills.    Objective: Vitals:   12/18/19 0007 12/18/19 0022 12/18/19 0618 12/18/19 0914  BP: (!) 101/46  (!) 131/53 (!) 150/52  Pulse: 72 71 72 72  Resp: 19 17 17 18   Temp: 97.7 F (36.5 C)  98 F (36.7 C) 98 F (36.7 C)  TempSrc: Oral  Oral Oral  SpO2: (!) 86% 92% 95% 92%  Weight:      Height:        Intake/Output Summary (Last 24 hours) at 12/18/2019 1312 Last data filed at 12/18/2019 0300 Gross per 24 hour  Intake 566.03 ml  Output --  Net 566.03 ml   Filed Weights   12/17/19 1255 12/17/19 2024  Weight: 101.2 kg 101.2 kg   REVIEW OF SYSTEMS  As per history otherwise all reviewed and reported negative  Exam:  General exam: chronically ill appearing, awake, sitting up, NAD.  Respiratory system: rales RLL. Mild increased work of breathing. Cardiovascular system: S1 & S2 heard. No JVD, murmurs, gallops, clicks or pedal edema. Gastrointestinal system: Abdomen is nondistended, soft and nontender. Normal bowel sounds heard. Central nervous system: Alert and oriented. No focal neurological deficits. Extremities: no CCE.  Data Reviewed: Basic Metabolic Panel: Recent Labs  Lab 12/17/19 1329 12/18/19 0444  NA 137 142  K 4.2 4.4  CL 105 111  CO2 22 21*  GLUCOSE 216* 106*  BUN 41* 42*  CREATININE 3.47* 3.18*  CALCIUM 8.6* 8.5*   Liver Function Tests: Recent Labs  Lab 12/17/19 1329  AST 18  ALT 26  ALKPHOS 140*  BILITOT 0.6  PROT 6.3*  ALBUMIN 2.8*   No results for input(s): LIPASE, AMYLASE in the  last 168 hours. No results for input(s): AMMONIA in the last 168 hours. CBC: Recent Labs  Lab 12/17/19 1523 12/18/19 0444  WBC 9.2 7.7  NEUTROABS 6.3  --   HGB 10.9* 9.8*  HCT 35.4* 32.1*  MCV 95.4 97.6  PLT 168 157   Cardiac Enzymes: No results for input(s): CKTOTAL, CKMB, CKMBINDEX, TROPONINI in the last 168 hours. CBG (last 3)  Recent Labs    12/18/19 0009 12/18/19 0620  12/18/19 1211  GLUCAP 127* 131* 148*   Recent Results (from the past 240 hour(s))  Blood culture (routine x 2)     Status: None (Preliminary result)   Collection Time: 12/17/19  1:41 PM   Specimen: Left Antecubital; Blood  Result Value Ref Range Status   Specimen Description LEFT ANTECUBITAL  Final   Special Requests   Final    BOTTLES DRAWN AEROBIC AND ANAEROBIC Blood Culture adequate volume   Culture   Final    NO GROWTH < 24 HOURS Performed at [facility], [address] [place], [place] [address]    Report Status PENDING  Incomplete  Blood culture (routine x 2)     Status: None (Preliminary result)   Collection Time: 12/17/19  1:41 PM   Specimen: BLOOD LEFT HAND  Result Value Ref Range Status   Specimen Description BLOOD LEFT HAND  Final   Special Requests   Final    BOTTLES DRAWN AEROBIC AND ANAEROBIC Blood Culture adequate volume   Culture   Final    NO GROWTH < 24 HOURS Performed at [facility], [address] [place], [place] [address]    Report Status PENDING  Incomplete  SARS CORONAVIRUS 2 (TAT 6-24 HRS) Nasopharyngeal Nasopharyngeal Swab     Status: None   Collection Time: 12/17/19  3:53 PM   Specimen: Nasopharyngeal Swab  Result Value Ref Range Status   SARS Coronavirus 2 NEGATIVE NEGATIVE Final    Comment: (NOTE) SARS-CoV-2 target nucleic acids are NOT DETECTED. The SARS-CoV-2 RNA is generally detectable in upper and lower respiratory specimens during the acute phase of infection. Negative results do not preclude SARS-CoV-2 infection, do not rule out co-infections with other pathogens, and should not be used as the sole basis for treatment or other patient management decisions. Negative results must be combined with clinical observations, patient history, and epidemiological information. The expected result is Negative. Fact Sheet for Patients: SugarRoll.be Fact Sheet for Healthcare  Providers: https://www.woods-mathews.com/ This test is not yet approved or cleared by the [place] FDA and  has been authorized for detection and/or diagnosis of SARS-CoV-2 by FDA under an Emergency Use Authorization (EUA). This EUA will remain  in effect (meaning this test can be used) for the duration of the COVID-19 declaration under Section 56 4(b)(1) of the Act, 21 U.S.C. section 360bbb-3(b)(1), unless the authorization is terminated or revoked sooner. Performed at [facility], [place] [address] [place], [place] [address]      Studies: DG Chest Port 1 View  Result Date: 12/17/2019 CLINICAL DATA:  Shortness with cough and fever EXAM: PORTABLE CHEST 1 VIEW COMPARISON:  December 17, 2019 study obtained earlier in the day and November 20, 2019 FINDINGS: There is focal airspace opacity in the lateral right base. The lungs elsewhere are clear. Heart is slightly enlarged with pulmonary vascularity within normal limits. No adenopathy. Patient is status post internal mammary bypass grafting. No adenopathy. No bone lesions. IMPRESSION: Airspace opacity consistent with pneumonia lateral right base, increased from earlier in the  day. Lungs elsewhere clear. Stable cardiac prominence. Electronically Signed   By: Lowella Grip III M.D.   On: 12/17/2019 14:05   Scheduled Meds: . amLODipine  10 mg Oral Daily  . aspirin EC  81 mg Oral Daily  . atorvastatin  80 mg Oral QPM  . clopidogrel  75 mg Oral Daily  . [START ON 12/19/2019] famotidine  10 mg Oral Daily  . [START ON 12/19/2019] gabapentin  400 mg Oral QHS  . guaiFENesin-dextromethorphan  10 mL Oral Q8H  . heparin  5,000 Units Subcutaneous Q8H  . hydrALAZINE  75 mg Oral Q12H  . insulin aspart  0-15 Units Subcutaneous TID WC  . insulin aspart  0-5 Units Subcutaneous QHS  . insulin aspart protamine- aspart  18 Units Subcutaneous BID WC  . isosorbide dinitrate  5 mg Oral BID  . levothyroxine  125 mcg Oral Daily   Continuous  Infusions: . sodium chloride 75 mL/hr at 12/17/19 2032  . azithromycin 500 mg (12/17/19 2033)  . cefTRIAXone (ROCEPHIN)  IV      Principal Problem:   Pneumonia Active Problems:   Essential hypertension   Asthma   Chronic kidney disease (CKD), stage III (moderate) (HCC)   Diabetes mellitus with renal manifestation (HCC)   Hx of CABG   AKI (acute kidney injury) ([place])   Time spent:   Irwin Brakeman, MD [facility] 12/18/2019, 1:12 PM    LOS: 1 day  How to contact the [facility] Attending or Consulting provider [place] or covering provider during after hours [place], for this patient?  1. Check the care team in [facility] and look for a) attending/consulting [facility] provider listed and b) the [facility] team listed 2. Log into [email or web address] and use [place]'s universal password to access. If you do not have the password, please contact the hospital operator. 3. Locate the [facility] provider you are looking for under [facility] and page to a number that you can be directly reached. 4. If you still have difficulty reaching the provider, please page the [facility] (Director on Call) for the Hospitalists listed on amion for assistance.

## 2019-12-18 NOTE — Plan of Care (Signed)
  Problem: Education: Goal: Knowledge of General Education information will improve Description: Including pain rating scale, medication(s)/side effects and non-pharmacologic comfort measures Outcome: Progressing   Problem: Clinical Measurements: Goal: Ability to maintain clinical measurements within normal limits will improve Outcome: Progressing Goal: Will remain free from infection Outcome: Progressing Goal: Diagnostic test results will improve Outcome: Progressing Goal: Respiratory complications will improve Outcome: Progressing Goal: Cardiovascular complication will be avoided Outcome: Progressing   Problem: Activity: Goal: Risk for activity intolerance will decrease Outcome: Progressing   Problem: Pain Managment: Goal: General experience of comfort will improve Outcome: Progressing   Problem: Safety: Goal: Ability to remain free from injury will improve Outcome: Progressing   

## 2019-12-19 DIAGNOSIS — Z794 Long term (current) use of insulin: Secondary | ICD-10-CM

## 2019-12-19 DIAGNOSIS — N184 Chronic kidney disease, stage 4 (severe): Secondary | ICD-10-CM

## 2019-12-19 DIAGNOSIS — E1122 Type 2 diabetes mellitus with diabetic chronic kidney disease: Secondary | ICD-10-CM

## 2019-12-19 LAB — CBC WITH DIFFERENTIAL/PLATELET
Abs Immature Granulocytes: 0.03 10*3/uL (ref 0.00–0.07)
Basophils Absolute: 0 10*3/uL (ref 0.0–0.1)
Basophils Relative: 0 %
Eosinophils Absolute: 0.2 10*3/uL (ref 0.0–0.5)
Eosinophils Relative: 3 %
HCT: 30.7 % — ABNORMAL LOW (ref 36.0–46.0)
Hemoglobin: 9.1 g/dL — ABNORMAL LOW (ref 12.0–15.0)
Immature Granulocytes: 0 %
Lymphocytes Relative: 26 %
Lymphs Abs: 1.8 10*3/uL (ref 0.7–4.0)
MCH: 28.9 pg (ref 26.0–34.0)
MCHC: 29.6 g/dL — ABNORMAL LOW (ref 30.0–36.0)
MCV: 97.5 fL (ref 80.0–100.0)
Monocytes Absolute: 0.7 10*3/uL (ref 0.1–1.0)
Monocytes Relative: 11 %
Neutro Abs: 4.2 10*3/uL (ref 1.7–7.7)
Neutrophils Relative %: 60 %
Platelets: 156 10*3/uL (ref 150–400)
RBC: 3.15 MIL/uL — ABNORMAL LOW (ref 3.87–5.11)
RDW: 14.6 % (ref 11.5–15.5)
WBC: 7 10*3/uL (ref 4.0–10.5)
nRBC: 0 % (ref 0.0–0.2)

## 2019-12-19 LAB — RENAL FUNCTION PANEL
Albumin: 2.4 g/dL — ABNORMAL LOW (ref 3.5–5.0)
Anion gap: 8 (ref 5–15)
BUN: 43 mg/dL — ABNORMAL HIGH (ref 8–23)
CO2: 21 mmol/L — ABNORMAL LOW (ref 22–32)
Calcium: 8.2 mg/dL — ABNORMAL LOW (ref 8.9–10.3)
Chloride: 112 mmol/L — ABNORMAL HIGH (ref 98–111)
Creatinine, Ser: 3.22 mg/dL — ABNORMAL HIGH (ref 0.44–1.00)
GFR calc Af Amer: 17 mL/min — ABNORMAL LOW (ref >60)
GFR calc non Af Amer: 15 mL/min — ABNORMAL LOW (ref >60)
Glucose, Bld: 98 mg/dL (ref 70–99)
Phosphorus: 4.8 mg/dL — ABNORMAL HIGH (ref 2.5–4.6)
Potassium: 4.5 mmol/L (ref 3.5–5.1)
Sodium: 141 mmol/L (ref 135–145)

## 2019-12-19 LAB — GLUCOSE, CAPILLARY
Glucose-Capillary: 79 mg/dL (ref 70–99)
Glucose-Capillary: 84 mg/dL (ref 70–99)
Glucose-Capillary: 89 mg/dL (ref 70–99)

## 2019-12-19 LAB — MAGNESIUM: Magnesium: 1.9 mg/dL (ref 1.7–2.4)

## 2019-12-19 MED ORDER — INSULIN ASPART PROT & ASPART (70-30 MIX) 100 UNIT/ML ~~LOC~~ SUSP
14.0000 [IU] | Freq: Two times a day (BID) | SUBCUTANEOUS | Status: DC
  Administered 2019-12-19: 14 [IU] via SUBCUTANEOUS
  Filled 2019-12-19: qty 10

## 2019-12-19 MED ORDER — ATORVASTATIN CALCIUM 80 MG PO TABS: 80.0000 mg | ORAL_TABLET | Freq: Every evening | ORAL | Status: DC

## 2019-12-19 MED ORDER — DOXYCYCLINE HYCLATE 100 MG PO CAPS: 100.0000 mg | ORAL_CAPSULE | Freq: Two times a day (BID) | ORAL | 0 refills | Status: AC

## 2019-12-19 MED ORDER — GUAIFENESIN-DM 100-10 MG/5ML PO SYRP: 10.0000 mL | ORAL_SOLUTION | Freq: Three times a day (TID) | ORAL | Status: DC | PRN

## 2019-12-19 NOTE — Discharge Summary (Signed)
Physician Discharge Summary  VALECIA BESKE EXB:[id number] DOB: 01/18/1956 DOA: 12/17/2019  PCP: [facility]: Johnney Ou   Admit date: 12/17/2019 Discharge date: 12/19/2019  Admitted From:  Home  Disposition:  Home   Recommendations for Outpatient Follow-up:  1. Follow up with PCP in 1 weeks 2. Follow up with nephrology Dr. Johnney Ou in 2 weeks  Discharge Condition: STABLE   CODE STATUS: FULL    Brief Hospitalization Summary: Please see all hospital notes, images, labs for full details of the hospitalization. HPI: Donna Howe is a 64 y.o. female with medical history significant for CABG, diabetes mellitus, hypertension, CKD 3, peripheral artery disease. Patient went to see her PCP today for above complaints, had a chest x-ray done and was subsequently referred to the ED for "pneumonia and fluid".  Patient reports onset of  difficulty breathing and cough productive of yellowish-green sputum that.  She reports fevers up to 102 yesterday.   Reports nausea with dry heaving, and poor p.o. intake over the past few days but no real vomiting, no abdominal pain, no loose stools.   Patient reports left-sided chest pain radiating down her left arm, that she noticed about a week ago when she went to the beach.  Chest pain was worse with activity -walking, and improved with rest.  Described chest pain as pressure-like, but different from chest pain she had when she had her heart attacks.  Since she came back on Sunday from the beach, she has not had recurrence of her chest pain.  She reports weight gain over the past few weeks, stable and unchanged lower extremity swelling.  Hospitalization 1/13-1/15/20  for acute kidney injury, bronchitis, malignant hypertension and Chest pain.  Renal function improved with IV fluids, also with chest pain with mild elevation in troponin peaked at 104, down trended to 66.  Managed conservatively and thought secondary to respiratory bronchitis.  ED  Course: 86% on room air improved with 2 L O2 to greater than 99%.  Blood pressure systolic 102V to 253G.  T-max 99.7.  WBC 9.2.  Creatinine elevated at 3.47.  Lactic acid normal 1.3.  Chest x-ray showed consistent with pneumonia lateral right base increased from earlier in the day.  Repeated troponin 384 >> 483.  EKG showed some ST abnormalities in leads II, III, aVF, V5 through V6. EDP talked to cardiologist Dr. Bronson Ing, elevated troponin likely secondary to pneumonia, no immediate cardiology intervention needed at this time. IV ceftriaxone given in ED.  Hospitalist to admit for further evaluation and management.  Brief Admission Hx: 64 year old female with stage III CKD, hypertension, CAD status post CABG, diabetes mellitus and peripheral artery disease presented complaining of shortness of breath and diagnosed with pneumonia.  MDM/Assessment & Plan:   1. Acute respiratory failure with hypoxia secondary to community acquired pneumonia-patient is slowly improving with treatments.  Pt was treated with IV antibiotics with good results.  Supplemental oxygen ordered as needed in hospital. Wean to room air.   Covid test negative.  Clinically patient is improved. Doxycycline 100 mg BID prescribed for 4 more days to complete full course of therapy.  2. Acute renal failure on superimposed CKD stage IIIb-patient reports that she recently had a renal biopsy done consistent with diabetic nephropathy.  She has been treated with gentle IV fluid hydration and some improvement noted.  Nephrology consult requested. 3. Asthma-she has been resumed on bronchodilator therapy.  Follow clinically. 4. Atypical chest pain-secondary to pneumonia.  Symptoms suggestive of pleuritic chest pain.  Symptoms resolved this morning.  Continue home cardiac medications. 5. Peripheral artery disease and carotid artery disease-patient was recently seen by vascular surgery and they plan to follow her closely.  She will be seen in the  next 6 months.  She does have claudication symptoms and they are considering starting Pletal.  She will follow up with her vascular surgeon regarding this.  6. Essential hypertension-stable on home medications follow-up. 7. Type 2 diabetes mellitus, controlled.  Most recent A1c 6.8%.  She is on supplemental sliding scale insulin and home 70/30 insulin.  DVT prophylaxis: heparin  Code Status: Full  Family Communication: updated patient regarding plan of care, verbalized understanding  Disposition Plan: HOME   Consultants:  Nephrology   Discharge Diagnoses:  Principal Problem:   Pneumonia Active Problems:   Essential hypertension   Asthma   Chronic kidney disease (CKD), stage III (moderate) ([place])   Diabetes mellitus with renal manifestation ([place])   Hx of CABG   AKI (acute kidney injury) ([place])  Discharge Instructions:  Allergies as of 12/19/2019      Reactions   Penicillins Other (See Comments)   REACTION: Unknown, told as a child Has patient had a PCN reaction causing immediate rash, facial/tongue/throat swelling, SOB or lightheadedness with hypotension: Unknown Has patient had a PCN reaction causing severe rash involving mucus membranes or skin necrosis: Unknown Has patient had a PCN reaction that required hospitalization: Unknown Has patient had a PCN reaction occurring within the last 10 years: No If all of the above answers are "NO", then may proceed with Cephalosporin use.      Medication List    TAKE these medications   Albuterol Sulfate 108 (90 Base) MCG/ACT Aepb Inhale 2 puffs into the lungs every 4 (four) hours as needed (Shortness of breath).   ALPRAZolam 0.5 MG tablet Commonly known as: XANAX Take 0.25-0.5 mg by mouth See admin instructions. Take 0.25 mg tab by mouth every morning & evening as needed  and .05 mg tab at bedtime   amLODipine 10 MG tablet Commonly known as: NORVASC Take 10 mg by mouth daily.   aspirin EC 81 MG tablet Take 81 mg by mouth  daily.   atorvastatin 80 MG tablet Commonly known as: LIPITOR Take 1 tablet (80 mg total) by mouth every evening.   cilostazol 100 MG tablet Commonly known as: PLETAL Take 1 tablet (100 mg total) by mouth 2 (two) times daily before a meal.   clopidogrel 75 MG tablet Commonly known as: PLAVIX Take 1 tablet (75 mg total) by mouth daily.   colchicine 0.6 MG tablet Take 0.6 mg by mouth daily.   doxycycline 100 MG capsule Commonly known as: VIBRAMYCIN Take 1 capsule (100 mg total) by mouth 2 (two) times daily for 4 days.   famotidine 20 MG tablet Commonly known as: Pepcid One after bfast and supper What changed:   how much to take  how to take this  when to take this   gabapentin 400 MG capsule Commonly known as: NEURONTIN Take 1 capsule (400 mg total) by mouth 4 (four) times daily.   hydrALAZINE 50 MG tablet Commonly known as: APRESOLINE Take 75 mg by mouth in the morning and at bedtime.   isosorbide dinitrate 5 MG tablet Commonly known as: ISORDIL Take 5 mg by mouth 2 (two) times daily.   levothyroxine 125 MCG tablet Commonly known as: SYNTHROID Take 125 mcg by mouth daily.   nitroGLYCERIN 0.4 MG SL tablet Commonly known as: NITROSTAT Place 0.4  mg under the tongue every 5 (five) minutes as needed for chest pain. Not to exceed 3 in 15 minute time frame   NovoLIN 70/30 ReliOn (70-30) 100 UNIT/ML injection Generic drug: insulin NPH-regular Human Inject 24 Units into the skin 2 (two) times daily with a meal. Am & PM   ondansetron 8 MG disintegrating tablet Commonly known as: Zofran ODT Take 1 tablet (8 mg total) by mouth every 8 (eight) hours as needed for nausea or vomiting.      Follow-up Information    [facility]. Schedule an appointment as soon as possible for a visit in 1 week(s).   Contact information: [place] [address] [phone]        Satira Sark, MD .   Specialty: Cardiology Contact information: [place] [address] [phone]        Justin Mend, MD. Schedule an appointment as soon as possible for a visit in 2 week(s).   Specialty: Internal Medicine Why: Hospital Follow Up  Contact information: [address] [place] [address] [phone]          Allergies  Allergen Reactions  . Penicillins Other (See Comments)    REACTION: Unknown, told as a child Has patient had a PCN reaction causing immediate rash, facial/tongue/throat swelling, SOB or lightheadedness with hypotension: Unknown Has patient had a PCN reaction causing severe rash involving mucus membranes or skin necrosis: Unknown Has patient had a PCN reaction that required hospitalization: Unknown Has patient had a PCN reaction occurring within the last 10 years: No If all of the above answers are "NO", then may proceed with Cephalosporin use.    Allergies as of 12/19/2019      Reactions   Penicillins Other (See Comments)   REACTION: Unknown, told as a child Has patient had a PCN reaction causing immediate rash, facial/tongue/throat swelling, SOB or lightheadedness with hypotension: Unknown Has patient had a PCN reaction causing severe rash involving mucus membranes or skin necrosis: Unknown Has patient had a PCN reaction that required hospitalization: Unknown Has patient had a PCN reaction occurring within the last 10 years: No If all of the above answers are "NO", then may proceed with Cephalosporin use.      Medication List    TAKE these medications   Albuterol Sulfate 108 (90 Base) MCG/ACT Aepb Inhale 2 puffs into the lungs every 4 (four) hours as needed (Shortness of breath).   ALPRAZolam 0.5 MG tablet Commonly known as: XANAX Take 0.25-0.5 mg by mouth See admin instructions. Take 0.25 mg tab by mouth every morning & evening as needed  and .05 mg tab at bedtime   amLODipine 10 MG tablet Commonly known as: NORVASC Take 10 mg by mouth daily.   aspirin EC 81 MG tablet Take 81  mg by mouth daily.   atorvastatin 80 MG tablet Commonly known as: LIPITOR Take 1 tablet (80 mg total) by mouth every evening.   cilostazol 100 MG tablet Commonly known as: PLETAL Take 1 tablet (100 mg total) by mouth 2 (two) times daily before a meal.   clopidogrel 75 MG tablet Commonly known as: PLAVIX Take 1 tablet (75 mg total) by mouth daily.   colchicine 0.6 MG tablet Take 0.6 mg by mouth daily.   doxycycline 100 MG capsule Commonly known as: VIBRAMYCIN Take 1 capsule (100 mg total) by mouth 2 (two) times daily for 4 days.   famotidine 20 MG tablet Commonly known as: Pepcid  One after bfast and supper What changed:   how much to take  how to take this  when to take this   gabapentin 400 MG capsule Commonly known as: NEURONTIN Take 1 capsule (400 mg total) by mouth 4 (four) times daily.   hydrALAZINE 50 MG tablet Commonly known as: APRESOLINE Take 75 mg by mouth in the morning and at bedtime.   isosorbide dinitrate 5 MG tablet Commonly known as: ISORDIL Take 5 mg by mouth 2 (two) times daily.   levothyroxine 125 MCG tablet Commonly known as: SYNTHROID Take 125 mcg by mouth daily.   nitroGLYCERIN 0.4 MG SL tablet Commonly known as: NITROSTAT Place 0.4 mg under the tongue every 5 (five) minutes as needed for chest pain. Not to exceed 3 in 15 minute time frame   NovoLIN 70/30 ReliOn (70-30) 100 UNIT/ML injection Generic drug: insulin NPH-regular Human Inject 24 Units into the skin 2 (two) times daily with a meal. Am & PM   ondansetron 8 MG disintegrating tablet Commonly known as: Zofran ODT Take 1 tablet (8 mg total) by mouth every 8 (eight) hours as needed for nausea or vomiting.       Procedures/Studies: DG Chest 2 View  Result Date: 11/20/2019 CLINICAL DATA:  Cough EXAM: CHEST - 2 VIEW COMPARISON:  10/01/2019 FINDINGS: Status post median sternotomy. Pulmonary vascular prominence without overt edema. The visualized skeletal structures are  unremarkable. IMPRESSION: Pulmonary vascular prominence without overt pulmonary edema. No focal airspace opacity. Electronically Signed   By: Eddie Candle M.D.   On: 11/20/2019 16:57   DG Chest Port 1 View  Result Date: 12/17/2019 CLINICAL DATA:  Shortness with cough and fever EXAM: PORTABLE CHEST 1 VIEW COMPARISON:  December 17, 2019 study obtained earlier in the day and November 20, 2019 FINDINGS: There is focal airspace opacity in the lateral right base. The lungs elsewhere are clear. Heart is slightly enlarged with pulmonary vascularity within normal limits. No adenopathy. Patient is status post internal mammary bypass grafting. No adenopathy. No bone lesions. IMPRESSION: Airspace opacity consistent with pneumonia lateral right base, increased from earlier in the day. Lungs elsewhere clear. Stable cardiac prominence. Electronically Signed   By: Lowella Grip III M.D.   On: 12/17/2019 14:05   VAS [place] ABI WITH/WO TBI  Result Date: 12/16/2019 LOWER EXTREMITY DOPPLER STUDY Indications: Claudication, and peripheral artery disease. High Risk Factors: Hypertension, hyperlipidemia, Diabetes, past history of                    smoking, prior MI, coronary artery disease.  Comparison Study: Prior at outside facility 08/31/2017 Right: 1.01/ 0.86 (TBI)                   and Left: 0.60/ 0.68 (TBI). Prior duplex 10/04/2017 showed a                   75-99% left SFA stenosis. Performing Technologist: Delorise Shiner RVT  Examination Guidelines: A complete evaluation includes at minimum, Doppler waveform signals and systolic blood pressure reading at the level of bilateral brachial, anterior tibial, and posterior tibial arteries, when vessel segments are accessible. Bilateral testing is considered an integral part of a complete examination. Photoelectric Plethysmograph (PPG) waveforms and toe systolic pressure readings are included as required and additional duplex testing as needed. Limited examinations for reoccurring  indications may be performed as noted.  ABI Findings: +---------+------------------+-----+--------+--------+ Right    Rt Pressure (mmHg)IndexWaveformComment  +---------+------------------+-----+--------+--------+ Brachial 168                                     +---------+------------------+-----+--------+--------+  ATA      126               0.75                  +---------+------------------+-----+--------+--------+ PTA      123               0.73 biphasic         +---------+------------------+-----+--------+--------+ DP                              biphasic         +---------+------------------+-----+--------+--------+ Great Toe93                0.55                  +---------+------------------+-----+--------+--------+ +---------+------------------+-----+---------+-------+ Left     Lt Pressure (mmHg)IndexWaveform Comment +---------+------------------+-----+---------+-------+ Brachial 146                                     +---------+------------------+-----+---------+-------+ ATA      115               0.68                  +---------+------------------+-----+---------+-------+ PTA      114               0.68 biphasic         +---------+------------------+-----+---------+-------+ DP                              triphasic        +---------+------------------+-----+---------+-------+ Great Toe63                0.38                  +---------+------------------+-----+---------+-------+ +-------+-----------+-----------+------------+------------+ ABI/TBIToday's ABIToday's TBIPrevious ABIPrevious TBI +-------+-----------+-----------+------------+------------+ Right  0.75       0.55                                +-------+-----------+-----------+------------+------------+ Left   0.68       0.38                                +-------+-----------+-----------+------------+------------+  Summary: Right: Resting right ankle-brachial  index indicates moderate right lower extremity arterial disease. The right toe-brachial index is abnormal. RT great toe pressure = 93 mmHg. Left: Resting left ankle-brachial index indicates moderate left lower extremity arterial disease. The left toe-brachial index is abnormal. LT Great toe pressure = 63 mmHg.  *See table(s) above for measurements and observations.  Electronically signed by Monica Martinez MD on 12/16/2019 at 10:07:41 AM.    Final    US BIOPSY (KIDNEY)  Result Date: 11/26/2019 INDICATION: 64 year old with history of acute on chronic kidney disease. Request for random renal biopsy. EXAM: ULTRASOUND-GUIDED LEFT RENAL BIOPSY MEDICATIONS: Hydralazine 10 mg IV ANESTHESIA/SEDATION: Moderate (conscious) sedation was employed during this procedure. A total of Versed 1.5 mg and Fentanyl 50 mcg was administered intravenously. Moderate Sedation Time: 1 hour and 23 minutes. The patient's level of consciousness and vital signs were monitored continuously by radiology nursing throughout the procedure under my direct supervision.  FLUOROSCOPY TIME:  None COMPLICATIONS: None immediate. PROCEDURE: Informed written consent was obtained from the patient after a thorough discussion of the procedural risks, benefits and alternatives. All questions were addressed. A timeout was performed prior to the initiation of the procedure. Patient's blood pressure was elevated prior to the procedure with the systolic pressures ranging from 180-200. Patient was given her p.o. antihypertensive medications and 10 mg of IV hydralazine. The systolic pressures were in the 150s approximately 1 hour after the medication and after giving moderate sedation. Patient was placed prone. Ultrasound demonstrated echogenic kidneys. The left kidney was targeted for biopsy. Left flank was prepped with chlorhexidine and sterile field was created. Skin and soft tissues were anesthetized with 1% lidocaine. Using ultrasound guidance, 16 gauge core  biopsy was obtained from the left kidney lower pole. Core specimens were small and a total of 3 core biopsies were obtained from the left kidney lower pole. Specimens placed in saline. Bandage placed over the puncture site. FINDINGS: Both kidneys are echogenic and compatible with chronic medical renal disease. Three core biopsies were obtained from the left kidney lower pole. No significant bleeding or hematoma formation following the core biopsies. IMPRESSION: Ultrasound-guided random core biopsies from the left kidney lower pole. Electronically Signed   By: Markus Daft M.D.   On: 11/26/2019 11:32   VAS US CAROTID  Result Date: 12/16/2019 Carotid Arterial Duplex Study Indications:       Carotid artery disease, right endarterectomy and Right                    carotid endarterestomy08/09/2012. Risk Factors:      Hypertension, hyperlipidemia, Diabetes, past history of                    smoking, prior MI, coronary artery disease, PAD. Comparison Study:  08/04/2015 Carotid duplex showed a patent right carotid                    endarterectomy site and <76% LICA stenosis. Performing Technologist: Delorise Shiner RVT  Examination Guidelines: A complete evaluation includes B-mode imaging, spectral Doppler, color Doppler, and power Doppler as needed of all accessible portions of each vessel. Bilateral testing is considered an integral part of a complete examination. Limited examinations for reoccurring indications may be performed as noted.  Right Carotid Findings: +----------+--------+--------+--------+--------------------------+--------+           PSV cm/sEDV cm/sStenosisPlaque Description        Comments +----------+--------+--------+--------+--------------------------+--------+ CCA Prox  118     27                                                 +----------+--------+--------+--------+--------------------------+--------+ CCA Mid   113     27                                                  +----------+--------+--------+--------+--------------------------+--------+ CCA Distal80      24              irregular and heterogenous         +----------+--------+--------+--------+--------------------------+--------+ ICA Prox  177     70      60-79%  homogeneous and smooth             +----------+--------+--------+--------+--------------------------+--------+  ICA Mid   196     56                                                 +----------+--------+--------+--------+--------------------------+--------+ ICA Distal171     66                                                 +----------+--------+--------+--------+--------------------------+--------+ ECA       337     0       >50%                                       +----------+--------+--------+--------+--------------------------+--------+ +----------+--------+-------+----------------+-------------------+           PSV cm/sEDV cmsDescribe        Arm Pressure (mmHG) +----------+--------+-------+----------------+-------------------+ [id number]      0      Multiphasic, WNL                    +----------+--------+-------+----------------+-------------------+ +---------+--------+--+--------+--+---------+ VertebralPSV cm/s79EDV cm/s27Antegrade +---------+--------+--+--------+--+---------+  Left Carotid Findings: +----------+--------+--------+--------+-------------------------+--------+           PSV cm/sEDV cm/sStenosisPlaque Description       Comments +----------+--------+--------+--------+-------------------------+--------+ CCA Prox  136     23              focal                             +----------+--------+--------+--------+-------------------------+--------+ CCA Mid   89      19                                                +----------+--------+--------+--------+-------------------------+--------+ CCA Distal118     29              irregular and homogeneous          +----------+--------+--------+--------+-------------------------+--------+ ICA Prox  128     33      1-39%   irregular                         +----------+--------+--------+--------+-------------------------+--------+ ICA Mid   128     36                                                +----------+--------+--------+--------+-------------------------+--------+ ICA Distal103     32                                                +----------+--------+--------+--------+-------------------------+--------+ ECA       123     15                                                +----------+--------+--------+--------+-------------------------+--------+ +----------+--------+--------+----------------+-------------------+  PSV cm/sEDV cm/sDescribe        Arm Pressure (mmHG) +----------+--------+--------+----------------+-------------------+ [id number]     0       Multiphasic, WNL                    +----------+--------+--------+----------------+-------------------+ +---------+--------+--+--------+--+---------+ VertebralPSV cm/s33EDV cm/s12Antegrade +---------+--------+--+--------+--+---------+   Summary: Right Carotid: Velocities in the right ICA are consistent with a 60-79%                stenosis. The ECA appears >50% stenosed. Left Carotid: Velocities in the left ICA are consistent with a 1-39% stenosis. Vertebrals: Bilateral vertebral arteries demonstrate antegrade flow. *See table(s) above for measurements and observations.  Electronically signed by Monica Martinez MD on 12/16/2019 at 5:12:06 PM.    Final       Subjective: Pt says that she is feeling a lot better today.  She still has some SOB with exertion but it overall is improving.    Discharge Exam: Vitals:   12/19/19 0743 12/19/19 0853  BP:  (!) 175/65  Pulse:  74  Resp:  18  Temp:  98.1 F (36.7 C)  SpO2: 94% 94%   Vitals:   12/19/19 0027 12/19/19 0511 12/19/19 0743 12/19/19 0853  BP: (!)  155/58 (!) 147/57  (!) 175/65  Pulse: 70 64  74  Resp: 19 16  18   Temp: 99.1 F (37.3 C) 97.9 F (36.6 C)  98.1 F (36.7 C)  TempSrc: Oral Oral  Oral  SpO2: 92% 92% 94% 94%  Weight:      Height:       General exam: chronically ill appearing, awake, sitting up, NAD.  Respiratory system: BBS clear. No increased work of breathing. Cardiovascular system: S1 & S2 heard. No JVD, murmurs, gallops, clicks or pedal edema. Gastrointestinal system: Abdomen is nondistended, soft and nontender. Normal bowel sounds heard. Central nervous system: Alert and oriented. No focal neurological deficits. Extremities: no CCE.   The results of significant diagnostics from this hospitalization (including imaging, microbiology, ancillary and laboratory) are listed below for reference.     Microbiology: Recent Results (from the past 240 hour(s))  Blood culture (routine x 2)     Status: None (Preliminary result)   Collection Time: 12/17/19  1:41 PM   Specimen: Left Antecubital; Blood  Result Value Ref Range Status   Specimen Description LEFT ANTECUBITAL  Final   Special Requests   Final    BOTTLES DRAWN AEROBIC AND ANAEROBIC Blood Culture adequate volume   Culture   Final    NO GROWTH 2 DAYS Performed at [facility], [address] [place], [place] [address]    Report Status PENDING  Incomplete  Blood culture (routine x 2)     Status: None (Preliminary result)   Collection Time: 12/17/19  1:41 PM   Specimen: BLOOD LEFT HAND  Result Value Ref Range Status   Specimen Description BLOOD LEFT HAND  Final   Special Requests   Final    BOTTLES DRAWN AEROBIC AND ANAEROBIC Blood Culture adequate volume   Culture   Final    NO GROWTH 2 DAYS Performed at [facility], [address] [place], [place] [address]    Report Status PENDING  Incomplete  SARS CORONAVIRUS 2 (TAT 6-24 HRS) Nasopharyngeal Nasopharyngeal Swab     Status: None   Collection Time: 12/17/19  3:53 PM   Specimen: Nasopharyngeal Swab   Result Value Ref Range Status   SARS Coronavirus 2 NEGATIVE NEGATIVE Final    Comment: (  NOTE) SARS-CoV-2 target nucleic acids are NOT DETECTED. The SARS-CoV-2 RNA is generally detectable in upper and lower respiratory specimens during the acute phase of infection. Negative results do not preclude SARS-CoV-2 infection, do not rule out co-infections with other pathogens, and should not be used as the sole basis for treatment or other patient management decisions. Negative results must be combined with clinical observations, patient history, and epidemiological information. The expected result is Negative. Fact Sheet for Patients: SugarRoll.be Fact Sheet for Healthcare Providers: https://www.woods-mathews.com/ This test is not yet approved or cleared by the [place] FDA and  has been authorized for detection and/or diagnosis of SARS-CoV-2 by FDA under an Emergency Use Authorization (EUA). This EUA will remain  in effect (meaning this test can be used) for the duration of the COVID-19 declaration under Section 56 4(b)(1) of the Act, 21 U.S.C. section 360bbb-3(b)(1), unless the authorization is terminated or revoked sooner. Performed at [facility], [place] [address], [place], [place] [address]      Labs: BNP (last 3 results) Recent Labs    12/17/19 1329  BNP 400.8*   Basic Metabolic Panel: Recent Labs  Lab 12/17/19 1329 12/18/19 0444 12/19/19 0449  NA 137 142 141  K 4.2 4.4 4.5  CL 105 111 112*  CO2 22 21* 21*  GLUCOSE 216* 106* 98  BUN 41* 42* 43*  CREATININE 3.47* 3.18* 3.22*  CALCIUM 8.6* 8.5* 8.2*  MG  --   --  1.9  PHOS  --   --  4.8*   Liver Function Tests: Recent Labs  Lab 12/17/19 1329 12/19/19 0449  AST 18  --   ALT 26  --   ALKPHOS 140*  --   BILITOT 0.6  --   PROT 6.3*  --   ALBUMIN 2.8* 2.4*   No results for input(s): LIPASE, AMYLASE in the last 168 hours. No results for input(s): AMMONIA in  the last 168 hours. CBC: Recent Labs  Lab 12/17/19 1523 12/18/19 0444 12/19/19 0449  WBC 9.2 7.7 7.0  NEUTROABS 6.3  --  4.2  HGB 10.9* 9.8* 9.1*  HCT 35.4* 32.1* 30.7*  MCV 95.4 97.6 97.5  PLT 168 157 156   Cardiac Enzymes: No results for input(s): CKTOTAL, CKMB, CKMBINDEX, TROPONINI in the last 168 hours. BNP: Invalid input(s): POCBNP CBG: Recent Labs  Lab 12/18/19 1625 12/18/19 2042 12/19/19 0016 12/19/19 0511 12/19/19 0727  GLUCAP 117* 142* 84 79 89   D-Dimer No results for input(s): DDIMER in the last 72 hours. Hgb A1c No results for input(s): HGBA1C in the last 72 hours. Lipid Profile No results for input(s): CHOL, HDL, LDLCALC, TRIG, CHOLHDL, LDLDIRECT in the last 72 hours. Thyroid function studies No results for input(s): TSH, T4TOTAL, T3FREE, THYROIDAB in the last 72 hours.  Invalid input(s): FREET3 Anemia work up No results for input(s): VITAMINB12, FOLATE, FERRITIN, TIBC, IRON, RETICCTPCT in the last 72 hours. Urinalysis    Component Value Date/Time   COLORURINE YELLOW 12/18/2019 1353   APPEARANCEUR HAZY (A) 12/18/2019 1353   LABSPEC 1.016 12/18/2019 1353   PHURINE 5.0 12/18/2019 1353   GLUCOSEU 50 (A) 12/18/2019 1353   HGBUR NEGATIVE 12/18/2019 1353   BILIRUBINUR NEGATIVE 12/18/2019 1353   KETONESUR NEGATIVE 12/18/2019 1353   PROTEINUR >=300 (A) 12/18/2019 1353   UROBILINOGEN 0.2 10/26/2013 2229   NITRITE NEGATIVE 12/18/2019 1353   LEUKOCYTESUR NEGATIVE 12/18/2019 1353   Sepsis Labs Invalid input(s): PROCALCITONIN,  WBC,  LACTICIDVEN Microbiology Recent Results (from the past 240  hour(s))  Blood culture (routine x 2)     Status: None (Preliminary result)   Collection Time: 12/17/19  1:41 PM   Specimen: Left Antecubital; Blood  Result Value Ref Range Status   Specimen Description LEFT ANTECUBITAL  Final   Special Requests   Final    BOTTLES DRAWN AEROBIC AND ANAEROBIC Blood Culture adequate volume   Culture   Final    NO GROWTH 2  DAYS Performed at [facility], [address], [place], [place] [address]    Report Status PENDING  Incomplete  Blood culture (routine x 2)     Status: None (Preliminary result)   Collection Time: 12/17/19  1:41 PM   Specimen: BLOOD LEFT HAND  Result Value Ref Range Status   Specimen Description BLOOD LEFT HAND  Final   Special Requests   Final    BOTTLES DRAWN AEROBIC AND ANAEROBIC Blood Culture adequate volume   Culture   Final    NO GROWTH 2 DAYS Performed at [facility], [address], [place], [place] [address]    Report Status PENDING  Incomplete  SARS CORONAVIRUS 2 (TAT 6-24 HRS) Nasopharyngeal Nasopharyngeal Swab     Status: None   Collection Time: 12/17/19  3:53 PM   Specimen: Nasopharyngeal Swab  Result Value Ref Range Status   SARS Coronavirus 2 NEGATIVE NEGATIVE Final    Comment: (NOTE) SARS-CoV-2 target nucleic acids are NOT DETECTED. The SARS-CoV-2 RNA is generally detectable in upper and lower respiratory specimens during the acute phase of infection. Negative results do not preclude SARS-CoV-2 infection, do not rule out co-infections with other pathogens, and should not be used as the sole basis for treatment or other patient management decisions. Negative results must be combined with clinical observations, patient history, and epidemiological information. The expected result is Negative. Fact Sheet for Patients: SugarRoll.be Fact Sheet for Healthcare Providers: https://www.woods-mathews.com/ This test is not yet approved or cleared by the [place] FDA and  has been authorized for detection and/or diagnosis of SARS-CoV-2 by FDA under an Emergency Use Authorization (EUA). This EUA will remain  in effect (meaning this test can be used) for the duration of the COVID-19 declaration under Section 56 4(b)(1) of the Act, 21 U.S.C. section 360bbb-3(b)(1), unless the authorization is terminated or revoked  sooner. Performed at [facility], [place] [address] [place], [place] [address]    Time coordinating discharge: 31 minutes   SIGNED:  Irwin Brakeman, MD  [facility] 12/19/2019, 11:11 AM How to contact the [facility] Attending or Consulting provider [place] or covering provider during after hours [place], for this patient?  1. Check the care team in [facility] and look for a) attending/consulting TR[facility] provider listed and b) the [facility] team listed 2. Log into [email or web address] and use [place]'s universal password to access. If you do not have the password, please contact the hospital operator. 3. Locate the [facility] provider you are looking for under [facility] and page to a number that you can be directly reached. 4. If you still have difficulty reaching the provider, please page the [facility] (Director on Call) for the Hospitalists listed on amion for assistance.

## 2019-12-19 NOTE — Discharge Instructions (Signed)
Community-Acquired Pneumonia, Adult Pneumonia is a type of lung infection that causes swelling in the airways of the lungs. Mucus and fluid may also build up inside the airways. This may cause coughing and difficulty breathing. There are different types of pneumonia. One type can develop while a person is in a hospital. A different type is called community-acquired pneumonia. It develops in people who are not, and have not recently been, in the hospital or another type of health care facility. What are the causes? This condition may be caused by:  Viruses. This is the most common cause of pneumonia.  Bacteria. Community-acquired pneumonia is often caused by Streptococcus pneumoniae bacteria. These bacteria are often passed from one person to another by breathing in droplets from the cough or sneeze of an infected person.  Fungi. This is the least common cause of pneumonia. What increases the risk? The following factors may make you more likely to develop this condition:  Having a chronic disease, such as chronic obstructive pulmonary disease (COPD), asthma, congestive heart failure, cystic fibrosis, diabetes, or kidney disease.  Having early-stage or late-stage HIV.  Having sickle cell disease.  Having had your spleen removed (splenectomy).  Having poor dental hygiene.  Having a medical condition that increases the risk of breathing in (aspirating) secretions from your own mouth and nose.  Having a weakened body defense system (immune system).  Being a smoker.  Traveling to areas where pneumonia-causing germs commonly exist.  Being around animal habitats or animals that have pneumonia-causing germs, including birds, bats, rabbits, cats, and farm animals. What are the signs or symptoms? Symptoms of this condition include:  A dry cough.  A wet (productive) cough.  Fever.  Sweating.  Chest pain, especially when breathing deeply or coughing.  Rapid breathing or difficulty  breathing.  Shortness of breath.  Shaking chills.  Fatigue.  Muscle aches. How is this diagnosed? This condition may be diagnosed based on:  Your medical history.  A physical exam. You may also have tests, including:  Chest X-rays.  Tests of your blood oxygen level and other blood gases.  Tests on blood, mucus (sputum), fluid around your lungs (pleural fluid), and urine. If your pneumonia is severe, other tests may be done to find the exact cause of your illness. How is this treated? Treatment for this condition depends on many factors, such as the cause of your pneumonia, the medicines you take, and other medical conditions that you have. For most adults, treatment and recovery from pneumonia may occur at home. In some cases, treatment must happen in a hospital. Treatment may include:  Medicines that are given by mouth or through an IV, including: ? Antibiotic medicines, if the pneumonia was caused by bacteria. ? Antiviral medicines, if the pneumonia was caused by a virus.  Being given extra oxygen.  Respiratory therapy. Although rare, treating severe pneumonia may include:  Using a machine to help you breathe (mechanical ventilation). This is done if you are not breathing well on your own and you cannot maintain a safe blood oxygen level.  Thoracentesis. This is a procedure to remove fluid from around one lung or both lungs to help you breathe better. Follow these instructions at home:  Medicines  Take over-the-counter and prescription medicines only as told by your health care provider. ? Only take cough medicine if you are losing sleep. Be aware that cough medicine can prevent your body's natural ability to remove mucus from your lungs.  If you were prescribed an antibiotic  medicine, take it as told by your health care provider. Do not stop taking the antibiotic even if you start to feel better. General instructions  Sleep in a semi-upright position at night. Try  sleeping in a reclining chair, or place a few pillows under your head.  Rest as needed and get at least 8 hours of sleep each night.  Drink enough water to keep your urine pale yellow. This will help to thin out mucus secretions in your lungs.  Eat a healthy diet that includes plenty of vegetables, fruits, whole grains, low-fat dairy products, and lean protein.  Do not use any products that contain nicotine or tobacco, such as cigarettes, e-cigarettes, and chewing tobacco. If you need help quitting, ask your health care provider.  Keep all follow-up visits as told by your health care provider. This is important. How is this prevented? You can lower your risk of developing community-acquired pneumonia by:  Getting a pneumococcal vaccine. There are different types and schedules of pneumococcal vaccines. Ask your health care provider which option is best for you. Consider getting the vaccine if: ? You are older than 62 years of age. ? You are older than 62 years of age and are undergoing cancer treatment, have chronic lung disease, or have other medical conditions that affect your immune system. Ask your health care provider if this applies to you.  Getting an influenza vaccine every year. Ask your health care provider which type of vaccine is best for you.  Getting regular checkups from your dentist.  Washing your hands often. If soap and water are not available, use hand sanitizer. Contact a health care provider if:  You have a fever.  You are losing sleep because you cannot control your cough with cough medicine. Get help right away if:  You have worsening shortness of breath.  You have increased chest pain.  Your sickness becomes worse, especially if you are an older adult or have a weakened immune system.  You cough up blood. Summary  Pneumonia is an infection of the lungs.  Community-acquired pneumonia develops in people who have not been in the hospital. It can be caused  by bacteria, viruses, or fungi.  This condition may be treated with antibiotics or antiviral medicines.  Severe cases may require hospitalization, mechanical ventilation, and other procedures to drain fluid from the lungs. This information is not intended to replace advice given to you by your health care provider. Make sure you discuss any questions you have with your health care provider. Document Revised: 05/02/2018 Document Reviewed: 05/02/2018 Elsevier Patient Education  [place].    IMPORTANT INFORMATION: PAY CLOSE ATTENTION   PHYSICIAN DISCHARGE INSTRUCTIONS  Follow with Primary care provider  P[facility], [facility]  and other consultants as instructed by your Hospitalist Physician  [place] IF SYMPTOMS COME BACK, WORSEN OR NEW PROBLEM DEVELOPS   Please note: You were cared for by a hospitalist during your hospital stay. Every effort will be made to forward records to your primary care provider.  You can request that your primary care provider send for your hospital records if they have not received them.  Once you are discharged, your primary care physician will handle any further medical issues. Please note that NO REFILLS for any discharge medications will be authorized once you are discharged, as it is imperative that you return to your primary care physician (or establish a relationship with a primary care physician if you do  not have one) for your post hospital discharge needs so that they can reassess your need for medications and monitor your lab values.  Please get a complete blood count and chemistry panel checked by your Primary MD at your next visit, and again as instructed by your Primary MD.  Get Medicines reviewed and adjusted: Please take all your medications with you for your next visit with your Primary MD  Laboratory/radiological data: Please request your Primary MD to go over all hospital tests and  procedure/radiological results at the follow up, please ask your primary care provider to get all Hospital records sent to his/her office.  In some cases, they will be blood work, cultures and biopsy results pending at the time of your discharge. Please request that your primary care provider follow up on these results.  If you are diabetic, please bring your blood sugar readings with you to your follow up appointment with primary care.    Please call and make your follow up appointments as soon as possible.    Also Note the following: If you experience worsening of your admission symptoms, develop shortness of breath, life threatening emergency, suicidal or homicidal thoughts you must seek medical attention immediately by calling 911 or calling your MD immediately  if symptoms less severe.  You must read complete instructions/literature along with all the possible adverse reactions/side effects for all the Medicines you take and that have been prescribed to you. Take any new Medicines after you have completely understood and accpet all the possible adverse reactions/side effects.   Do not drive when taking Pain medications or sleeping medications (Benzodiazepines)  Do not take more than prescribed Pain, Sleep and Anxiety Medications. It is not advisable to combine anxiety,sleep and pain medications without talking with your primary care practitioner  Special Instructions: If you have smoked or chewed Tobacco  in the last 2 yrs please stop smoking, stop any regular Alcohol  and or any Recreational drug use.  Wear Seat belts while driving.  Do not drive if taking any narcotic, mind altering or controlled substances or recreational drugs or alcohol.

## 2019-12-19 NOTE — Care Management Important Message (Signed)
Important Message  Patient Details  Name: Donna Howe MRN: [id number] Date of Birth: 04-12-1956   Medicare Important Message Given:  Yes     [name] 12/19/2019, 11:46 AM

## 2019-12-19 NOTE — Plan of Care (Signed)

## 2019-12-19 NOTE — Progress Notes (Signed)
[facility] NEPHROLOGY PROGRESS NOTE  Assessment/ Plan: Pt is a 64 y.o. yo female   with history of hypertension, HLD, DM, PAD, CAD, status post CABG, CKD due to biopsy-proven diabetic nephropathy admitted with pneumonia, seen for AKI on CKD.  #Acute kidney injury on CKD stage III likely hemodynamically mediated in the setting of pneumonia/decreased oral intake.  Nonoliguric.  Creatinine level is stable.  No obstruction. Recent kidney ultrasound with increased renal echogenicity. Clinically looks much better, no need for dialysis.  #CKD stage III with recent baseline creatinine level around 2.7-3 due to biopsy-proven diabetic nephropathy and severe IFTA. Patient recently had biopsy done on 3/10.  Patient was informed of the biopsy report.  She will need outpatient follow-up at [facility].  #Community-acquired pneumonia/hypoxia: Currently on azithromycin and ceftriaxone per primary team.  Clinically improved.  Per primary team.  #CAD status post CABG: Chest pain.  Patient has elevated troponin and abnormal EKG.  Per cardiology.  #Hypertension: Resume home medications.  Volume status acceptable.  Her renal function seems stable.  Okay to discharge from renal perspective.  If she remains in the hospital we will check her back on Monday.  Subjective: Seen and examined at bedside.  She reports feeling much better.  In room air.  Denies nausea vomiting chest pain shortness of breath.  No urinary complaint.  Urine output is not recorded.  Creatinine level 3.2. Objective Vital signs in last 24 hours: Vitals:   12/19/19 0027 12/19/19 0511 12/19/19 0743 12/19/19 0853  BP: (!) 155/58 (!) 147/57  (!) 175/65  Pulse: 70 64  74  Resp: 19 16  18   Temp: 99.1 F (37.3 C) 97.9 F (36.6 C)  98.1 F (36.7 C)  TempSrc: Oral Oral  Oral  SpO2: 92% 92% 94% 94%  Weight:      Height:       Weight change:   Intake/Output Summary (Last 24 hours) at 12/19/2019 1035 Last data filed at 12/19/2019  0300 Gross per 24 hour  Intake 1254.55 ml  Output 50 ml  Net 1204.55 ml       Labs: Basic Metabolic Panel: Recent Labs  Lab 12/17/19 1329 12/18/19 0444 12/19/19 0449  NA 137 142 141  K 4.2 4.4 4.5  CL 105 111 112*  CO2 22 21* 21*  GLUCOSE 216* 106* 98  BUN 41* 42* 43*  CREATININE 3.47* 3.18* 3.22*  CALCIUM 8.6* 8.5* 8.2*  PHOS  --   --  4.8*   Liver Function Tests: Recent Labs  Lab 12/17/19 1329 12/19/19 0449  AST 18  --   ALT 26  --   ALKPHOS 140*  --   BILITOT 0.6  --   PROT 6.3*  --   ALBUMIN 2.8* 2.4*   No results for input(s): LIPASE, AMYLASE in the last 168 hours. No results for input(s): AMMONIA in the last 168 hours. CBC: Recent Labs  Lab 12/17/19 1523 12/18/19 0444 12/19/19 0449  WBC 9.2 7.7 7.0  NEUTROABS 6.3  --  4.2  HGB 10.9* 9.8* 9.1*  HCT 35.4* 32.1* 30.7*  MCV 95.4 97.6 97.5  PLT 168 157 156   Cardiac Enzymes: No results for input(s): CKTOTAL, CKMB, CKMBINDEX, TROPONINI in the last 168 hours. CBG: Recent Labs  Lab 12/18/19 1625 12/18/19 2042 12/19/19 0016 12/19/19 0511 12/19/19 0727  GLUCAP 117* 142* 84 79 89    Iron Studies: No results for input(s): IRON, TIBC, TRANSFERRIN, FERRITIN in the last 72 hours. Studies/Results: DG Chest Port 1 [address]  Result Date: 12/17/2019 CLINICAL DATA:  Shortness with cough and fever EXAM: PORTABLE CHEST 1 VIEW COMPARISON:  December 17, 2019 study obtained earlier in the day and November 20, 2019 FINDINGS: There is focal airspace opacity in the lateral right base. The lungs elsewhere are clear. Heart is slightly enlarged with pulmonary vascularity within normal limits. No adenopathy. Patient is status post internal mammary bypass grafting. No adenopathy. No bone lesions. IMPRESSION: Airspace opacity consistent with pneumonia lateral right base, increased from earlier in the day. Lungs elsewhere clear. Stable cardiac prominence. Electronically Signed   By: Lowella Grip III M.D.   On: 12/17/2019 14:05     Medications: Infusions: . azithromycin 500 mg (12/18/19 2021)  . cefTRIAXone (ROCEPHIN)  IV Stopped (12/18/19 1437)    Scheduled Medications: . amLODipine  10 mg Oral Daily  . aspirin EC  81 mg Oral Daily  . atorvastatin  80 mg Oral QPM  . clopidogrel  75 mg Oral Daily  . famotidine  10 mg Oral Daily  . gabapentin  400 mg Oral QHS  . guaiFENesin-dextromethorphan  10 mL Oral Q8H  . heparin  5,000 Units Subcutaneous Q8H  . hydrALAZINE  75 mg Oral Q12H  . insulin aspart  0-15 Units Subcutaneous TID WC  . insulin aspart  0-5 Units Subcutaneous QHS  . insulin aspart protamine- aspart  14 Units Subcutaneous BID WC  . isosorbide dinitrate  5 mg Oral BID  . levothyroxine  125 mcg Oral Daily    have reviewed scheduled and prn medications.  Physical Exam: General:NAD, comfortable Heart:RRR, s1s2 nl, no rubs Lungs:clear b/l, no crackle Abdomen:soft, Non-tender, non-distended Extremities: Trace LE edema Neurology: Alert awake and following commands  Shivani Barrantes Tanna Furry 12/19/2019,10:35 AM  LOS: 2 days  Pager: [phone]

## 2019-12-19 NOTE — Evaluation (Signed)
Physical Therapy Evaluation Patient Details Name: Donna Howe MRN: [id number] DOB: 1955-09-29 Today's Date: 12/19/2019   History of Present Illness  Donna Howe is a 64 y.o. female with medical history significant for CABG, diabetes mellitus, hypertension, CKD 3, peripheral artery disease. Patient went to see her PCP today for above complaints, had a chest x-ray done and was subsequently referred to the ED for "pneumonia and fluid". Patient reports onset of  difficulty breathing and cough productive of yellowish-green sputum that.  She reports fevers up to 102 yesterday.   Reports nausea with dry heaving, and poor p.o. intake over the past few days but no real vomiting, no abdominal pain, no loose stools.Patient reports left-sided chest pain radiating down her left arm, that she noticed about a week ago when she went to the beach.  Chest pain was worse with activity -walking, and improved with rest.  Described chest pain as pressure-like, but different from chest pain she had when she had her heart attacks.  Since she came back on Sunday from the beach, she has not had recurrence of her chest pain.      Clinical Impression  Patient functioning near baseline for functional mobility and gait but is limited by unsteady gait and impaired activity tolerance. She does not require assist for bed mobility, transfers, or gait. She ambulates with decreased cadence with unsteadiness and intermittent staggering. She fatigues quickly with ambulation and requires 1 short standing rest break. Upon returning to room, patient demonstrates fatigue and has SOB. Discussed patient's dizziness/ unsteady feeling with her and possible causes. Patient educated on living an active lifestyle and potentially following up with outpatient PT should she continue to have deficits. Patient discharged to care of nursing for ambulation daily as tolerated for length of stay.      Follow Up Recommendations No PT follow up     Equipment Recommendations  None recommended by PT    Recommendations for Other Services       Precautions / Restrictions Precautions Precautions: Fall Restrictions Weight Bearing Restrictions: No      Mobility  Bed Mobility Overal bed mobility: Independent                Transfers Overall transfer level: Modified independent               General transfer comment: slighly slower, min unsteadiness upon standing  Ambulation/Gait Ambulation/Gait assistance: Modified independent (Device/Increase time) Gait Distance (Feet): 100 Feet   Gait Pattern/deviations: Step-through pattern;Staggering left;Staggering right Gait velocity: decreased   General Gait Details: ambulates without AD with min/mod unsteadiness, 1 short standing rest break, fatigued following  Stairs            Wheelchair Mobility    Modified Rankin (Stroke Patients Only)       Balance Overall balance assessment: Needs assistance Sitting-balance support: Feet supported;No upper extremity supported Sitting balance-Leahy Scale: Normal Sitting balance - Comments: seated EOB   Standing balance support: During functional activity;No upper extremity supported Standing balance-Leahy Scale: Fair Standing balance comment: fair/ good while ambulating                             Pertinent Vitals/Pain Pain Assessment: No/denies pain    Home Living Family/patient expects to be discharged to:: Private residence Living Arrangements: Alone Available Help at Discharge: Friend(s);Available PRN/intermittently Type of Home: Mobile home Home Access: Ramped entrance     Home Layout: One level Home  Equipment: Gilford Rile - 2 wheels;Cane - single point;Bedside commode      Prior Function Level of Independence: Independent         Comments: Patient states community ambulator with rest breaks     Hand Dominance        Extremity/Trunk Assessment   Upper Extremity  Assessment Upper Extremity Assessment: Overall WFL for tasks assessed    Lower Extremity Assessment Lower Extremity Assessment: Overall WFL for tasks assessed    Cervical / Trunk Assessment Cervical / Trunk Assessment: Normal  Communication   Communication: No difficulties  Cognition Arousal/Alertness: Awake/alert Behavior During Therapy: WFL for tasks assessed/performed Overall Cognitive Status: Within Functional Limits for tasks assessed                                        General Comments      Exercises     Assessment/Plan    PT Assessment Patent does not need any further PT services  PT Problem List         PT Treatment Interventions      PT Goals (Current goals can be found in the Care Plan section)  Acute Rehab PT Goals Patient Stated Goal: return home PT Goal Formulation: With patient Time For Goal Achievement: 12/26/19 Potential to Achieve Goals: Good    Frequency     Barriers to discharge        Co-evaluation               AM-PAC PT "6 Clicks" Mobility  Outcome Measure Help needed turning from your back to your side while in a flat bed without using bedrails?: None Help needed moving from lying on your back to sitting on the side of a flat bed without using bedrails?: None Help needed moving to and from a bed to a chair (including a wheelchair)?: None Help needed standing up from a chair using your arms (e.g., wheelchair or bedside chair)?: None Help needed to walk in hospital room?: A Little Help needed climbing 3-5 steps with a railing? : A Little 6 Click Score: 22    End of Session   Activity Tolerance: Patient tolerated treatment well Patient left: in bed;with call bell/phone within reach Nurse Communication: Mobility status PT Visit Diagnosis: Unsteadiness on feet (R26.81);Other abnormalities of gait and mobility (R26.89);Difficulty in walking, not elsewhere classified (R26.2)    Time: 6063-0160 PT Time  Calculation (min) (ACUTE ONLY): 18 min   Charges:   PT Evaluation $PT Eval Low Complexity: 1 Low PT Treatments $Therapeutic Activity: 8-22 mins        10:01 AM, 12/19/19 Mearl Latin PT, DPT Physical Therapist at [place] 12/19/2019, 9:50 AM

## 2019-12-19 NOTE — Progress Notes (Signed)
Nsg Discharge Note  Admit Date:  12/17/2019 Discharge date: 12/19/2019   Donna Howe to be D/C'd home per MD order.  AVS completed.  Copy for chart, and copy for patient signed, and dated. Patient/caregiver able to verbalize understanding.  Discharge Medication: Allergies as of 12/19/2019      Reactions   Penicillins Other (See Comments)   REACTION: Unknown, told as a child Has patient had a PCN reaction causing immediate rash, facial/tongue/throat swelling, SOB or lightheadedness with hypotension: Unknown Has patient had a PCN reaction causing severe rash involving mucus membranes or skin necrosis: Unknown Has patient had a PCN reaction that required hospitalization: Unknown Has patient had a PCN reaction occurring within the last 10 years: No If all of the above answers are "NO", then may proceed with Cephalosporin use.      Medication List    TAKE these medications   Albuterol Sulfate 108 (90 Base) MCG/ACT Aepb Inhale 2 puffs into the lungs every 4 (four) hours as needed (Shortness of breath).   ALPRAZolam 0.5 MG tablet Commonly known as: XANAX Take 0.25-0.5 mg by mouth See admin instructions. Take 0.25 mg tab by mouth every morning & evening as needed  and .05 mg tab at bedtime   amLODipine 10 MG tablet Commonly known as: NORVASC Take 10 mg by mouth daily.   aspirin EC 81 MG tablet Take 81 mg by mouth daily.   atorvastatin 80 MG tablet Commonly known as: LIPITOR Take 1 tablet (80 mg total) by mouth every evening.   cilostazol 100 MG tablet Commonly known as: PLETAL Take 1 tablet (100 mg total) by mouth 2 (two) times daily before a meal.   clopidogrel 75 MG tablet Commonly known as: PLAVIX Take 1 tablet (75 mg total) by mouth daily.   colchicine 0.6 MG tablet Take 0.6 mg by mouth daily.   doxycycline 100 MG capsule Commonly known as: VIBRAMYCIN Take 1 capsule (100 mg total) by mouth 2 (two) times daily for 4 days.   famotidine 20 MG tablet Commonly known  as: Pepcid One after bfast and supper What changed:   how much to take  how to take this  when to take this   gabapentin 400 MG capsule Commonly known as: NEURONTIN Take 1 capsule (400 mg total) by mouth 4 (four) times daily.   hydrALAZINE 50 MG tablet Commonly known as: APRESOLINE Take 75 mg by mouth in the morning and at bedtime.   isosorbide dinitrate 5 MG tablet Commonly known as: ISORDIL Take 5 mg by mouth 2 (two) times daily.   levothyroxine 125 MCG tablet Commonly known as: SYNTHROID Take 125 mcg by mouth daily.   nitroGLYCERIN 0.4 MG SL tablet Commonly known as: NITROSTAT Place 0.4 mg under the tongue every 5 (five) minutes as needed for chest pain. Not to exceed 3 in 15 minute time frame   NovoLIN 70/30 ReliOn (70-30) 100 UNIT/ML injection Generic drug: insulin NPH-regular Human Inject 24 Units into the skin 2 (two) times daily with a meal. Am & PM   ondansetron 8 MG disintegrating tablet Commonly known as: Zofran ODT Take 1 tablet (8 mg total) by mouth every 8 (eight) hours as needed for nausea or vomiting.       Discharge Assessment: Vitals:   12/19/19 0743 12/19/19 0853  BP:  (!) 175/65  Pulse:  74  Resp:  18  Temp:  98.1 F (36.7 C)  SpO2: 94% 94%   Skin clean, dry and intact without evidence of  skin break down, no evidence of skin tears noted. IV catheter discontinued intact. Site without signs and symptoms of complications - no redness or edema noted at insertion site, patient denies c/o pain - only slight tenderness at site.  Dressing with slight pressure applied.  D/c Instructions-Education: Discharge instructions given to patient/family with verbalized understanding. D/c education completed with patient/family including follow up instructions, medication list, d/c activities limitations if indicated, with other d/c instructions as indicated by MD - patient able to verbalize understanding, all questions fully answered. Patient instructed to  return to ED, call 911, or call MD for any changes in condition.  Patient escorted via [place], and D/C home via private auto.  Zachery Conch, RN 12/19/2019 12:00 PM

## 2019-12-22 ENCOUNTER — Other Ambulatory Visit: Payer: Self-pay

## 2019-12-22 ENCOUNTER — Ambulatory Visit: Payer: Medicare HMO | Admitting: Adult Health

## 2019-12-22 ENCOUNTER — Ambulatory Visit: Payer: Medicare HMO | Admitting: Internal Medicine

## 2019-12-22 ENCOUNTER — Encounter: Payer: Self-pay | Admitting: Adult Health

## 2019-12-22 DIAGNOSIS — N1832 Chronic kidney disease, stage 3b: Secondary | ICD-10-CM | POA: Diagnosis not present

## 2019-12-22 DIAGNOSIS — R058 Other specified cough: Secondary | ICD-10-CM

## 2019-12-22 DIAGNOSIS — R05 Cough: Secondary | ICD-10-CM | POA: Diagnosis not present

## 2019-12-22 DIAGNOSIS — J189 Pneumonia, unspecified organism: Secondary | ICD-10-CM | POA: Diagnosis not present

## 2019-12-22 LAB — CULTURE, BLOOD (ROUTINE X 2)
Culture: NO GROWTH
Culture: NO GROWTH
Special Requests: ADEQUATE
Special Requests: ADEQUATE

## 2019-12-22 NOTE — Patient Instructions (Addendum)
Finish Doxycycline as planned today .  Mucinex Twice daily  As needed  Cough/congestion  Delsym 2 tsp Twice daily  As needed  Cough.  Follow up in 2-3 weeks with chest xray with Dr. Melvyn Novas  And As needed   Set up for PFT in 6 weeks and As needed   Please contact office for sooner follow up if symptoms do not improve or worsen or seek emergency care

## 2019-12-22 NOTE — Assessment & Plan Note (Signed)
Right-sided pneumonia with hospitalization.  Patient is completing a full course of antibiotics.  She will recheck chest x-ray in 2 to 3 weeks.  Plan  Patient Instructions  Finish Doxycycline as planned today .  Mucinex Twice daily  As needed  Cough/congestion  Delsym 2 tsp Twice daily  As needed  Cough.  Follow up in 2-3 weeks with chest xray with Dr. Melvyn Novas  And As needed   Set up for PFT in 6 weeks and As needed   Please contact office for sooner follow up if symptoms do not improve or worsen or seek emergency care

## 2019-12-22 NOTE — Assessment & Plan Note (Signed)
Follow-up with nephrology as planned ?

## 2019-12-22 NOTE — Progress Notes (Signed)
@Patient  ID: [name], female    DOB: Aug 03, 1956, 64 y.o.   MRN: [id number]  Chief Complaint  Patient presents with  . Follow-up    PNA     Referring provider: Rory Percy, MD  HPI: 64 year old female former smoker quit 2014 seen for pulmonary consult November 20, 2019 for Chronic cough Medical history significant for coronary disease status post CABG, diabetes, chronic kidney disease stage III, peripheral artery disease and hypertension.  TEST/EVENTS :   12/22/2019 Follow up : PNA , Cough  Patient presents for a post hospital follow-up.  Patient was admitted last week for right-sided pneumonia.  She had increased cough congestion and fever.  Chest x-ray showed space opacity in the right base of the lung .  He did with IV antibiotics and transition to doxycycline at discharge.  Testing was negative.  Patient did have acute on chronic renal failure.  She was given IV hydration.  And she has a follow-up with nephrology this week.  Patient says since discharge she is slowly starting to feel better.  She still feels very weak.  Has low appetite.  She is finishing up her antibiotics today.  She denies any hemoptysis chest pain orthopnea or increased edema. Patient does have a history of chronic cough.  She is currently on gabapentin 3 times a day.  Last visit it was increased to 4 times a day but insurance would not cover.     Allergies  Allergen Reactions  . Penicillins Other (See Comments)    REACTION: Unknown, told as a child Has patient had a PCN reaction causing immediate rash, facial/tongue/throat swelling, SOB or lightheadedness with hypotension: Unknown Has patient had a PCN reaction causing severe rash involving mucus membranes or skin necrosis: Unknown Has patient had a PCN reaction that required hospitalization: Unknown Has patient had a PCN reaction occurring within the last 10 years: No If all of the above answers are "NO", then may proceed with Cephalosporin use.       There is no immunization history on file for this patient.  Past Medical History:  Diagnosis Date  . Anemia   . Asthma   . CAD (coronary artery disease)    Multivessel s/p CABG 2005, numerous PCIs since that time  . Carotid artery disease (HCC)    R CEA  . Chronic kidney disease (CKD), stage III (moderate)   . Essential hypertension   . Gout   . Hyperlipidemia   . Hypothyroidism   . Myocardial infarction ([place])   . PAD (peripheral artery disease) ([place])    Dr. Kellie Simmering  . S/P angioplasty with stent- DES to [facility] and to [facility] to LAD with DES 04/09/18.   04/10/2018  . SBO (small bowel obstruction) ([place]) 2011   Status post lysis of adhesions & hernia repair  . Sinus bradycardia   . Thrombocytopenia, unspecified ([place])   . Type 2 diabetes mellitus ([place])   . Umbilical hernia     Tobacco History: Social History   Tobacco Use  Smoking Status Former Smoker  . Packs/day: 1.00  . Years: 20.00  . Pack years: 20.00  . Types: Cigarettes  . Quit date: 12/10/2012  . Years since quitting: 7.0  Smokeless Tobacco Never Used   Counseling given: Not Answered   Outpatient Medications Prior to Visit  Medication Sig Dispense Refill  . Albuterol Sulfate 108 (90 Base) MCG/ACT AEPB Inhale 2 puffs into the lungs every 4 (four) hours as needed (Shortness of breath).     Marland Kitchen  ALPRAZolam (XANAX) 0.5 MG tablet Take 0.25-0.5 mg by mouth See admin instructions. Take 0.25 mg tab by mouth every morning & evening as needed  and .05 mg tab at bedtime    . amLODipine (NORVASC) 10 MG tablet Take 10 mg by mouth daily.     Marland Kitchen aspirin EC 81 MG tablet Take 81 mg by mouth daily.     Marland Kitchen atorvastatin (LIPITOR) 80 MG tablet Take 1 tablet (80 mg total) by mouth every evening.    . clopidogrel (PLAVIX) 75 MG tablet Take 1 tablet (75 mg total) by mouth daily. 90 tablet 3  . colchicine 0.6 MG tablet Take 0.6 mg by mouth daily.     Marland Kitchen doxycycline (VIBRAMYCIN) 100 MG capsule Take 1 capsule (100 mg total) by mouth 2 (two)  times daily for 4 days. 8 capsule 0  . famotidine (PEPCID) 20 MG tablet One after bfast and supper (Patient taking differently: Take 20 mg by mouth 2 (two) times daily. One after bfast and supper) 60 tablet 11  . gabapentin (NEURONTIN) 400 MG capsule Take 1 capsule (400 mg total) by mouth 4 (four) times daily. 120 capsule 2  . hydrALAZINE (APRESOLINE) 50 MG tablet Take 75 mg by mouth in the morning and at bedtime.     . isosorbide dinitrate (ISORDIL) 5 MG tablet Take 5 mg by mouth 2 (two) times daily.    Marland Kitchen levothyroxine (SYNTHROID, LEVOTHROID) 125 MCG tablet Take 125 mcg by mouth daily.     . nitroGLYCERIN (NITROSTAT) 0.4 MG SL tablet Place 0.4 mg under the tongue every 5 (five) minutes as needed for chest pain. Not to exceed 3 in 15 minute time frame    . NOVOLIN 70/30 RELION (70-30) 100 UNIT/ML injection Inject 24 Units into the skin 2 (two) times daily with a meal. Am & PM    . ondansetron (ZOFRAN ODT) 8 MG disintegrating tablet Take 1 tablet (8 mg total) by mouth every 8 (eight) hours as needed for nausea or vomiting. 20 tablet 0  . cilostazol (PLETAL) 100 MG tablet Take 1 tablet (100 mg total) by mouth 2 (two) times daily before a meal. (Patient not taking: Reported on 12/22/2019) 60 tablet 11   No facility-administered medications prior to visit.     Review of Systems:   Constitutional:   No  weight loss, night sweats,  Fevers, chills, fatigue, or  lassitude.  HEENT:   No headaches,  Difficulty swallowing,  Tooth/dental problems, or  Sore throat,                No sneezing, itching, ear ache,  +nasal congestion, post nasal drip,   CV:  No chest pain,  Orthopnea, PND, swelling in lower extremities, anasarca, dizziness, palpitations, syncope.   GI  No heartburn, indigestion, abdominal pain, nausea, vomiting, diarrhea, change in bowel habits, loss of appetite, bloody stools.   Resp:  No chest wall deformity  Skin: no rash or lesions.  GU: no dysuria, change in color of urine, no  urgency or frequency.  No flank pain, no hematuria   MS:  No joint pain or swelling.  No decreased range of motion.  No back pain.    Physical Exam  BP 136/72 (BP Location: Left Arm, Patient Position: Sitting, Cuff Size: Normal)   Pulse 72   Temp (!) 97.1 F (36.2 C) (Temporal)   Ht 5\' 6"  (1.676 m)   Wt 221 lb 4.8 oz (100.4 kg)   SpO2 96% Comment: room air  BMI 35.72 kg/m   GEN: A/Ox3; pleasant , NAD, well nourished    HEENT:  [place]/AT,  EACs-clear, TMs-wnl, NOSE-clear, THROAT-clear, no lesions, no postnasal drip or exudate noted.   NECK:  Supple w/ fair ROM; no JVD; normal carotid impulses w/o bruits; no thyromegaly or nodules palpated; no lymphadenopathy.    RESP  Clear  P & A; w/o, wheezes/ rales/ or rhonchi. no accessory muscle use, no dullness to percussion  CARD:  RRR, no m/r/g, no peripheral edema, pulses intact, no cyanosis or clubbing.  GI:   Soft & nt; nml bowel sounds; no organomegaly or masses detected.   Musco: Warm bil, no deformities or joint swelling noted.   Neuro: alert, no focal deficits noted.    Skin: Warm, no lesions or rashes    Lab Results:   BNP  ProBNP No results found for: PROBNP  Imaging:    No flowsheet data found.  No results found for: NITRICOXIDE      Assessment & Plan:   Pneumonia Right-sided pneumonia with hospitalization.  Patient is completing a full course of antibiotics.  She will recheck chest x-ray in 2 to 3 weeks.  Plan  Patient Instructions  Finish Doxycycline as planned today .  Mucinex Twice daily  As needed  Cough/congestion  Delsym 2 tsp Twice daily  As needed  Cough.  Follow up in 2-3 weeks with chest xray with Dr. Melvyn Novas  And As needed   Set up for PFT in 6 weeks and As needed   Please contact office for sooner follow up if symptoms do not improve or worsen or seek emergency care        Chronic kidney disease (CKD), stage III (moderate) ([place]) Follow-up with nephrology as planned  Upper airway  cough syndrome Continue on current cough suppression regimen.  Check PFTs on return  Plan  Patient Instructions  Finish Doxycycline as planned today .  Mucinex Twice daily  As needed  Cough/congestion  Delsym 2 tsp Twice daily  As needed  Cough.  Follow up in 2-3 weeks with chest xray with Dr. Melvyn Novas  And As needed   Set up for PFT in 6 weeks and As needed   Please contact office for sooner follow up if symptoms do not improve or worsen or seek emergency care           Rexene Edison, NP 12/22/2019

## 2019-12-22 NOTE — Assessment & Plan Note (Signed)
Continue on current cough suppression regimen.  Check PFTs on return  Plan  Patient Instructions  Finish Doxycycline as planned today .  Mucinex Twice daily  As needed  Cough/congestion  Delsym 2 tsp Twice daily  As needed  Cough.  Follow up in 2-3 weeks with chest xray with Dr. Melvyn Novas  And As needed   Set up for PFT in 6 weeks and As needed   Please contact office for sooner follow up if symptoms do not improve or worsen or seek emergency care

## 2019-12-23 ENCOUNTER — Other Ambulatory Visit: Payer: Self-pay | Admitting: *Deleted

## 2019-12-23 ENCOUNTER — Encounter: Payer: Self-pay | Admitting: *Deleted

## 2019-12-23 NOTE — Patient Outreach (Signed)
[place] [facility]) Care Management [facility] CM Telephone Outreach, [place] notification/ General Discharge Post-hospital discharge day # 4 Unsuccessful outreach attempt # 1- new referral 12/23/2019  Donna Howe 03/22/1956 [id number]  EMMI Red Alert Notification: General Discharge EMMI call date/ day: Sunday December 21, 2019; day 1 EMMI Red reason(s): "has not read discharge papers;" "No scheduled follow up"  Unsuccessfulinitial attempttelephone outreach to [name], 64 y/o female referred to [facility] CM 12/22/19 by [facility] CMA after EMMI Red Alert received; patient was recently hospitalized March 31- December 19, 2019 for acute respiratory failure due to CAP; she was discharged to home/ self-care without home health services in place.Patient has history including, but not limited to, CAD with previous CABG/ PCI; DM- II; CKD- III; HTN; and obesity.  Call placed today to follow up on [place] from yesterday, as patient was noted to be at scheduled pulmonology provider appointment at time of planned call on 12/22/19.  HIPAA compliant voice mail message left for patient, requesting return call back.  Plan:  Will place [facility] Community CM unsuccessful patient outreach letter in mail requesting call back in writing  Will re-attempt [place] telephone outreach within 4 business days if I do not hear back from patient first.  Oneta Rack, RN, BSN, [employer] Coordinator [facility] Care Management  [phone]

## 2019-12-24 DIAGNOSIS — R69 Illness, unspecified: Secondary | ICD-10-CM | POA: Diagnosis not present

## 2019-12-25 ENCOUNTER — Encounter (HOSPITAL_COMMUNITY): Payer: Self-pay

## 2019-12-25 ENCOUNTER — Encounter: Payer: Self-pay | Admitting: *Deleted

## 2019-12-25 ENCOUNTER — Other Ambulatory Visit: Payer: Self-pay | Admitting: *Deleted

## 2019-12-25 NOTE — Patient Outreach (Signed)
[facility]) Care Management [facility] CM Telephone Outreach, EMMI Red-Alert notification/ General discharge PCP office completes Transition of Care follow up post-hospital discharge Post-hospital discharge day # 6   12/25/2019  Donna Howe Jul 20, 1956 [id number]  EMMI Red Alert Notification: General Discharge EMMI call date/ day: Sunday December 21, 2019; day 1 EMMI Red reason(s): "has not read discharge papers;" "No scheduled follow up"  Eventual successfulsecondattempttelephone outreach to [name], 64 y/o female referred to [facility] CM 12/22/19 by [facility] CMA after EMMI Red Alert received; patient was recently hospitalized March 31- December 19, 2019 for acute respiratory failure due to CAP; she was discharged to home/ self-care without home health services in place.Patient has history including, but not limited to, CAD with previous CABG/ PCI; DM- II; CKD- III; HTN; and obesity.  Left voice message for patient and she called me back within minutes of my call; HIPAA/ identity verified and purpose of call/ [facility] CM services were discussed with patient; patient reports that she is currently driving and does not have much battery charge; she declines full EMMI screening, stating that she "is doing fine; just a little weak."  Reports that the EMMI calls she received "didn't allow me to talk; it hung up on me;" and she assures me today that she has reviewed her post-hospital discharge instructions and has attended pulmonology office visit on Monday 12/22/19; reports that she has a PCP appointment scheduled for next week, along with plans to attend.  Patient denies clinical concerns and sounds to be in no distress throughout phone call today.  Patient further reports:  -- has and takes all medications as prescribed; denies medication review today, due to currently driving; denies concerns around medications; confirms that she self-manages medications -- independent in ADL's and  iADL's: denies community resource needs ---- SDOH completed for depression/ transportation/ and food insecurity and patient denies concerns around all; no concerns identified by screening outreach today -- patient questions me about obtaining the corona virus vaccine and I provided education around options to obtain vaccine; patient states she is not sure she wishes to get the vaccine and stated that she "never" gets vaccines for flu or pneumonia: discussed with patient need to ask her pulmonary/ PCP providers for advice around recommendations to receive, given her recent diagnosis of pneumonia; patient agrees to discuss with PCP at scheduled officevisit next week.  Patient was encouraged to continue wearing a mask, maintaining social distancing when she is out and frequent hand washing.  Patient denies further issues, concerns, or problems today and she tells me that her phone battery is failing; she declines ongoing [facility] CM services and denies care coordination/ care management needs several times throughout our call today.  Ensured that patient has my direct phone number, the main [facility] CM office phone number, and the [facility] CM 24-hour nurse advice phone number should she change her mind and wish to participate in [facility] CM program in the future; patient is agreeable.  Plan:  Will close [facility] CM EMMI case as patient denies current care coordination/ care management needs and declines ongoing [facility] CM follow up, and will make patient's PCP aware of same  Oneta Rack, RN, BSN, [place] Coordinator [facility] Care Management  [phone]

## 2019-12-26 ENCOUNTER — Telehealth: Payer: Self-pay | Admitting: Internal Medicine

## 2019-12-29 DIAGNOSIS — E1121 Type 2 diabetes mellitus with diabetic nephropathy: Secondary | ICD-10-CM | POA: Diagnosis not present

## 2019-12-29 DIAGNOSIS — N184 Chronic kidney disease, stage 4 (severe): Secondary | ICD-10-CM | POA: Diagnosis not present

## 2019-12-29 DIAGNOSIS — N2581 Secondary hyperparathyroidism of renal origin: Secondary | ICD-10-CM | POA: Diagnosis not present

## 2019-12-29 DIAGNOSIS — N189 Chronic kidney disease, unspecified: Secondary | ICD-10-CM | POA: Diagnosis not present

## 2019-12-29 DIAGNOSIS — D631 Anemia in chronic kidney disease: Secondary | ICD-10-CM | POA: Diagnosis not present

## 2019-12-29 DIAGNOSIS — R809 Proteinuria, unspecified: Secondary | ICD-10-CM | POA: Diagnosis not present

## 2019-12-29 NOTE — Telephone Encounter (Signed)
Dr. Melvyn Novas, did you want to override warning? The gabapentin max per day is 3 times per day (3 caps). Please advise.    gabapentin (NEURONTIN) 400 MG capsule [[id number]]   Order Details Dose: 400 mg Route: Oral Frequency: 4 times daily  Dispense Quantity: 120 capsule Refills: 2       Sig: Take 1 capsule (400 mg total) by mouth 4 (four) times daily.      Start Date: 11/20/19 End Date: --  Written Date: 11/20/19 Expiration Date: 11/19/20  Original Order:  gabapentin (NEURONTIN) 400 MG capsule [[id number]]  Providers  Authorizing Provider: Tanda Rockers, MD NPI: [id number]  DEA #: [id number]  Ordering User:  Tanda Rockers, MD      Pharmacy  [facility] - [place], [place] - [address]  [address], [place] [address]  Phone:  [phone] Fax:  [phone]  DEA #:  --

## 2019-12-29 NOTE — Telephone Encounter (Signed)
Spoke with the pharmacy and notified 300 mg qid  Pt has ov with MW 01/13/20

## 2019-12-29 NOTE — Telephone Encounter (Signed)
300 mg qid is fine alternative and she is overdue for f/u so just use this dose and have her return next available to regroup

## 2019-12-30 DIAGNOSIS — N2581 Secondary hyperparathyroidism of renal origin: Secondary | ICD-10-CM | POA: Diagnosis not present

## 2019-12-30 DIAGNOSIS — N184 Chronic kidney disease, stage 4 (severe): Secondary | ICD-10-CM | POA: Diagnosis not present

## 2019-12-30 DIAGNOSIS — N189 Chronic kidney disease, unspecified: Secondary | ICD-10-CM | POA: Diagnosis not present

## 2019-12-30 NOTE — Progress Notes (Signed)
Cardiology Office Note    Date:  01/05/2020   ID:  Donna Howe, DOB 11-25-1955, MRN [id number]  PCP:  [facility]  Cardiologist: Rozann Lesches, MD EPS: None  Chief Complaint  Patient presents with  . Hospitalization Follow-up    History of Present Illness:  Donna Howe is a 64 y.o. female with history of CAD status post CABG in 2005, stent SVG to RCA and Cutting Balloon to the proximal circumflex 05/2017, PTCA to the proximal circumflex stage DES to the SVG to RCA and DES LIMA to the LAD 03/2018 also has hypertension, hyperlipidemia, CKD stage IIIb. 2D echo 10/02/2019 LVEF 60 to 65%.  Patient discharged from the hospital 12/19/2019 after being treated for pneumonia.  Troponins elevated 384 and 43 and EKG had some inferior lateral ST abnormalities.  Dr. Bronson Ing reviewed and felt likely secondary to pneumonia.  Patient comes in for f/u. She says the chest pain and left arm hurting before she went to the hospital worried her. She felt like something was sitting on her chest. Has had same sensation with bronchitis. No chest pain since she's been home. Has some dyspnea on exertion.   Past Medical History:  Diagnosis Date  . Anemia   . Asthma   . CAD (coronary artery disease)    Multivessel s/p CABG 2005, numerous PCIs since that time  . Carotid artery disease (HCC)    R CEA  . Chronic kidney disease (CKD), stage III (moderate)   . Essential hypertension   . Gout   . Hyperlipidemia   . Hypothyroidism   . Myocardial infarction ([place])   . PAD (peripheral artery disease) ([place])    Dr. Kellie Simmering  . S/P angioplasty with stent- DES to [facility] and to LIMA to LAD with DES 04/09/18.   04/10/2018  . SBO (small bowel obstruction) ([place]) 2011   Status post lysis of adhesions & hernia repair  . Sinus bradycardia   . Thrombocytopenia, unspecified ([place])   . Type 2 diabetes mellitus ([place])   . Umbilical hernia     Past Surgical History:  Procedure Laterality Date  .  CESAREAN SECTION  1984  . CHOLECYSTECTOMY  2010  . CORONARY ARTERY BYPASS GRAFT  2005  . CORONARY BALLOON ANGIOPLASTY N/A 05/31/2017   Procedure: CORONARY BALLOON ANGIOPLASTY;  Surgeon: Jettie Booze, MD;  Location: [place] [facility];  Service: Cardiovascular;  Laterality: N/A;  . CORONARY STENT INTERVENTION N/A 05/31/2017   Procedure: CORONARY STENT INTERVENTION;  Surgeon: Jettie Booze, MD;  Location: [place] [facility];  Service: Cardiovascular;  Laterality: N/A;  . CORONARY STENT INTERVENTION N/A 04/09/2018   Procedure: CORONARY STENT INTERVENTION;  Surgeon: Jettie Booze, MD;  Location: [place] [facility];  Service: Cardiovascular;  Laterality: N/A;  SVG RCA  . ENDARTERECTOMY Right 04/18/2013   Procedure: ENDARTERECTOMY CAROTID;  Surgeon: Mal Misty, MD;  Location: [place];  Service: Vascular;  Laterality: Right;  . HERNIA REPAIR  1989  . Incisional hernia repair x2  03/04/2010   Laparoscopic with 35cm mesh by Dr Ronnald Collum  . LEFT HEART CATH AND CORS/GRAFTS ANGIOGRAPHY N/A 05/31/2017   Procedure: LEFT HEART CATH AND CORS/GRAFTS ANGIOGRAPHY;  Surgeon: Jettie Booze, MD;  Location: [place] [facility];  Service: Cardiovascular;  Laterality: N/A;  . LEFT HEART CATH AND CORS/GRAFTS ANGIOGRAPHY N/A 04/08/2018   Procedure: LEFT HEART CATH AND CORS/GRAFTS ANGIOGRAPHY;  Surgeon: Jettie Booze, MD;  Location: [place] [facility];  Service: Cardiovascular;  Laterality: N/A;  . LEFT HEART CATHETERIZATION WITH CORONARY ANGIOGRAM N/A 12/19/2012   Procedure: LEFT HEART CATHETERIZATION WITH CORONARY ANGIOGRAM;  Surgeon: Josue Hector, MD;  Location: [facility] C[facility] LAB;  Service: Cardiovascular;  Laterality: N/A;  . LEFT HEART CATHETERIZATION WITH CORONARY/GRAFT ANGIOGRAM N/A 04/19/2013   Procedure: LEFT HEART CATHETERIZATION WITH Beatrix Fetters;  Surgeon: Lorretta Harp, MD;  Location: [facility] CATH LAB;  Service: Cardiovascular;  Laterality: N/A;  . PATCH ANGIOPLASTY Right  04/18/2013   Procedure: PATCH ANGIOPLASTY Right Internal Carotid Artery;  Surgeon: Mal Misty, MD;  Location: [place];  Service: Vascular;  Laterality: Right;  . PERCUTANEOUS CORONARY STENT INTERVENTION (PCI-S) Right 12/19/2012   Procedure: PERCUTANEOUS CORONARY STENT INTERVENTION (PCI-S);  Surgeon: Josue Hector, MD;  Location: [place] CATH LAB;  Service: Cardiovascular;  Laterality: Right;  . SHOULDER SURGERY      Current Medications: Current Meds  Medication Sig  . Albuterol Sulfate 108 (90 Base) MCG/ACT AEPB Inhale 2 puffs into the lungs every 4 (four) hours as needed (Shortness of breath).   . ALPRAZolam (XANAX) 0.5 MG tablet Take 0.25-0.5 mg by mouth See admin instructions. Take 0.25 mg tab by mouth every morning & evening as needed  and .05 mg tab at bedtime  . amLODipine (NORVASC) 10 MG tablet Take 10 mg by mouth daily.   Marland Kitchen aspirin EC 81 MG tablet Take 81 mg by mouth daily.   Marland Kitchen atorvastatin (LIPITOR) 80 MG tablet Take 1 tablet (80 mg total) by mouth every evening.  . clopidogrel (PLAVIX) 75 MG tablet Take 1 tablet (75 mg total) by mouth daily.  . colchicine 0.6 MG tablet Take 0.6 mg by mouth daily.   . famotidine (PEPCID) 20 MG tablet One after bfast and supper (Patient taking differently: Take 20 mg by mouth 2 (two) times daily. One after bfast and supper)  . ferrous sulfate 325 (65 FE) MG tablet Take 325 mg by mouth daily with breakfast.  . gabapentin (NEURONTIN) 400 MG capsule Take 1 capsule (400 mg total) by mouth 4 (four) times daily.  . hydrALAZINE (APRESOLINE) 50 MG tablet Take 75 mg by mouth in the morning and at bedtime.   . isosorbide dinitrate (ISORDIL) 5 MG tablet Take 5 mg by mouth 2 (two) times daily.  Marland Kitchen levothyroxine (SYNTHROID, LEVOTHROID) 125 MCG tablet Take 125 mcg by mouth daily.   . nitroGLYCERIN (NITROSTAT) 0.4 MG SL tablet Place 0.4 mg under the tongue every 5 (five) minutes as needed for chest pain. Not to exceed 3 in 15 minute time frame  . NOVOLIN 70/30 RELION  (70-30) 100 UNIT/ML injection Inject 24 Units into the skin 2 (two) times daily with a meal. Am & PM  . ondansetron (ZOFRAN ODT) 8 MG disintegrating tablet Take 1 tablet (8 mg total) by mouth every 8 (eight) hours as needed for nausea or vomiting.  . Vitamin D, Ergocalciferol, (DRISDOL) 1.25 MG (50000 UNIT) CAPS capsule Take 50,000 Units by mouth once a week.     Allergies:   Penicillins   Social History   Socioeconomic History  . Marital status: Divorced    Spouse name: Not on file  . Number of children: Not on file  . Years of education: Not on file  . Highest education level: Not on file  Occupational History  . Occupation: Disabled  Tobacco Use  . Smoking status: Former Smoker    Packs/day: 1.00    Years: 20.00    Pack years: 20.00    Types: Cigarettes  Quit date: 12/10/2012    Years since quitting: 7.0  . Smokeless tobacco: Never Used  Substance and Sexual Activity  . Alcohol use: No    Alcohol/week: 0.0 standard drinks  . Drug use: No  . Sexual activity: Not Currently  Other Topics Concern  . Not on file  Social History Narrative   Lives with mother.   Social Determinants of Health   Financial Resource Strain:   . Difficulty of Paying Living Expenses:   Food Insecurity:   . Worried About Charity fundraiser in the Last Year:   . Arboriculturist in the Last Year:   Transportation Needs:   . Film/video editor (Medical):   Marland Kitchen Lack of Transportation (Non-Medical):   Physical Activity:   . Days of Exercise per Week:   . Minutes of Exercise per Session:   Stress:   . Feeling of Stress :   Social Connections:   . Frequency of Communication with Friends and Family:   . Frequency of Social Gatherings with Friends and Family:   . Attends Religious Services:   . Active Member of Clubs or Organizations:   . Attends Archivist Meetings:   Marland Kitchen Marital Status:      Family History:  The patient's family history includes AAA (abdominal aortic aneurysm) in  her father; Diabetes in her mother; Heart disease in her brother and mother; Hyperlipidemia in her mother and son; Hypertension in her brother, father, mother, and son; Thyroid disease in her father.   ROS:   Please see the history of present illness.    ROS All other systems reviewed and are negative.   PHYSICAL EXAM:   VS:  BP 136/78   Pulse 75   Temp 98.1 F (36.7 C)   Ht 5\' 6"  (1.676 m)   Wt 222 lb (100.7 kg)   SpO2 97%   BMI 35.83 kg/m   Physical Exam  GEN: Obese, in no acute distress  Neck: Bilateral carotid bruits no JVD,  or masses Cardiac:RRR 2/6 systolic murmur at the left sternal border; no rubs, or gallops  Respiratory:  clear to auscultation bilaterally, normal work of breathing GI: soft, nontender, nondistended, + BS Ext: without cyanosis, clubbing, or edema, Good distal pulses bilaterally Neuro:  Alert and Oriented x 3 Psych: euthymic mood, full affect  Wt Readings from Last 3 Encounters:  01/05/20 222 lb (100.7 kg)  12/22/19 221 lb 4.8 oz (100.4 kg)  12/17/19 223 lb (101.2 kg)      Studies/Labs Reviewed:   EKG:  EKG is not ordered today.    Recent Labs: 12/17/2019: ALT 26; B Natriuretic Peptide 713.0 12/19/2019: BUN 43; Creatinine, Ser 3.22; Hemoglobin 9.1; Magnesium 1.9; Platelets 156; Potassium 4.5; Sodium 141   Lipid Panel    Component Value Date/Time   CHOL 160 10/02/2019 0425   TRIG 186 (H) 10/02/2019 0425   HDL 44 10/02/2019 0425   CHOLHDL 3.6 10/02/2019 0425   VLDL 37 10/02/2019 0425   LDLCALC 79 10/02/2019 0425    Additional studies/ records that were reviewed today include:  Carotid Dopplers 12/16/19 Summary:  Right Carotid: Velocities in the right ICA are consistent with a 60-79%                 stenosis. The ECA appears >50% stenosed.   Left Carotid: Velocities in the left ICA are consistent with a 1-39%  stenosis.   Vertebrals: Bilateral vertebral arteries demonstrate antegrade flow.   *See table(s) above  for measurements and  observations.      Electronically signed by Monica Martinez MD on 12/16/2019 at 5:12:06 PM.      Echocardiogram 10/02/2019:  1. Left ventricular ejection fraction, by visual estimation, is 60 to 65%. The left ventricle has normal function. There is mildly increased left ventricular hypertrophy.  2. Left ventricular diastolic parameters are indeterminate.  3. The left ventricle has no regional wall motion abnormalities.  4. Global right ventricle has normal systolic function.The right ventricular size is normal. No increase in right ventricular wall thickness.  5. Left atrial size was normal.  6. Right atrial size was normal.  7. The mitral valve is normal in structure. Trivial mitral valve regurgitation. No evidence of mitral stenosis.  8. The tricuspid valve is not well visualized.  9. The aortic valve is normal in structure. Aortic valve regurgitation is not visualized. No evidence of aortic valve sclerosis or stenosis. 10. The pulmonic valve was not well visualized. Pulmonic valve regurgitation is not visualized.    LHC 04/09/2018  Non-stenotic Ost LM to LM lesion previously treated.  Ost LAD to Prox LAD lesion is 80% stenosed.  Non-stenotic Ost Cx to Mid Cx lesion previously treated.  1st Mrg-1 lesion is 70% stenosed.  Ost 1st Mrg lesion is 80% stenosed.  1st Mrg-2 lesion is 10% stenosed.  Prox Cx to Mid Cx lesion is 50% stenosed.  Patent stent in the ostium of the SVG to the RCA  Mid RCA lesion is 90% stenosed.  A drug-eluting stent was successfully placed using a STENT SIERRA 2.50 X 15 MM, through the SVG to RCA graft.  Post intervention, there is a 0% residual stenosis.  Origin lesion is 70% stenosed of the LIMA to LAD.  A drug-eluting stent was successfully placed using a STENT SYNERGY DES 3X12, postdilated to 3.25 mm.  Post intervention, there is a 0% residual stenosis.     Recommend uninterrupted dual antiplatelet therapy with Aspirin 81mg  daily and  Ticagrelor 90mg  twice daily for a minimum of 12 months (ACS - Class I recommendation).    Continue aggressive secondary prevention.    LHC 04/08/2018  Non-stenotic Ost LM to LM lesion previously treated.  Ost LAD to Prox LAD lesion is 80% stenosed. LIMA to LAD is patent with a proximal 60% lesion.  Non-stenotic Ost Cx to Mid Cx lesion previously treated.  Prox Cx to Mid Cx lesion is 50% stenosed.  Balloon angioplasty was performed.  1st Mrg-1 lesion is 70% stenosed.  Ost 1st Mrg lesion is 80% stenosed.  1st Mrg-2 lesion is 10% stenosed.  Origin to Prox Graft lesion is 100% stenosed. SVG to OM is occluded.  Previously placed Origin drug eluting stent is widely patent.  Balloon angioplasty was performed.  Mid RCA lesion is 90% stenosed. THis is past the insertion of the graft.  LV end diastolic pressure is mildly elevated.  There is no aortic valve stenosis.  Origin lesion is 60% stenosed.       Recommend dual antiplatelet therapy with Aspirin 81mg  daily and Clopidogrel 75mg  daily long-term (beyond 12 months) because of extensive CAD, and multiple stents..    Will look into repeat PCI attempt tomorrow if Cr is ok.  Could consider redo CABG eval as well given extensive circ disease and now two lesions in the SVG to RCA in the last 12 months, proximal LIMA lesion.  Will discuss with interventional colleagues.    ASSESSMENT:    1. Coronary artery disease involving coronary bypass graft  of native heart without angina pectoris   2. Essential hypertension   3. Hyperlipidemia, unspecified hyperlipidemia type   4. CKD (chronic kidney disease) stage 4, GFR 15-29 ml/min (HCC)   5. Educated about COVID-19 virus infection      PLAN:  In order of problems listed above:  CAD status post CABG in 2005, stent SVG to RCA and Cutting Balloon to the proximal circumflex 05/2017, PTCA to the proximal circumflex stage DES to the SVG to RCA and DES LIMA to the LAD 03/2018, recent  hospitalization for pneumonia and had elevated troponins and nonspecific ST changes felt secondary to  demand ischemia. No further chest pain. Patient would like to hold off on stress test until pulmonary issues clear completely. Has f/u with Dr. Domenic Polite scheduled.   Essential hypertension BP controlled  Hyperlipidemia on lipitor  CKD stage IV crt 3.22 12/19/19  Covid 19 education on the virus/vaccine  Carotid stenosis 60 to 79% right 1 to 39% left on Dopplers 12/16/2019 followed by Dr. Carlis Abbott [facility]  Medication Adjustments/Labs and Tests Ordered: Current medicines are reviewed at length with the patient today.  Concerns regarding medicines are outlined above.  Medication changes, Labs and Tests ordered today are listed in the Patient Instructions below. Patient Instructions  Medication Instructions:  Your physician recommends that you continue on your current medications as directed. Please refer to the Current Medication list given to you today.  *If you need a refill on your cardiac medications before your next appointment, please call your pharmacy*   Lab Work: NONE   If you have labs (blood work) drawn today and your tests are completely normal, you will receive your results only by: Marland Kitchen MyChart Message (if you have MyChart) OR . A paper copy in the mail If you have any lab test that is abnormal or we need to change your treatment, we will call you to review the results.   Testing/Procedures: NONE    Follow-Up: At [facility], you and your health needs are our priority.  As part of our continuing mission to provide you with exceptional heart care, we have created designated Provider Care Teams.  These Care Teams include your primary Cardiologist (physician) and Advanced Practice Providers (APPs -  Physician Assistants and Nurse Practitioners) who all work together to provide you with the care you need, when you need it.  We recommend signing up for the patient portal called  "MyChart".  Sign up information is provided on this After Visit Summary.  MyChart is used to connect with patients for Virtual Visits (Telemedicine).  Patients are able to view lab/test results, encounter notes, upcoming appointments, etc.  Non-urgent messages can be sent to your provider as well.   To learn more about what you can do with MyChart, go to [email or web address].    Your next appointment:    May or June   The format for your next appointment:   In Person  Provider:   Rozann Lesches, MD   Other [place] [phone]  Thank you for choosing [facility]!       Sumner Boast, PA-C  01/05/2020 3:03 PM    [facility] [place], [place], [place]  [address] Phone: [phone]; Fax: [phone]

## 2020-01-01 DIAGNOSIS — J189 Pneumonia, unspecified organism: Secondary | ICD-10-CM | POA: Diagnosis not present

## 2020-01-01 DIAGNOSIS — Z6836 Body mass index (BMI) 36.0-36.9, adult: Secondary | ICD-10-CM | POA: Diagnosis not present

## 2020-01-05 ENCOUNTER — Other Ambulatory Visit: Payer: Self-pay

## 2020-01-05 ENCOUNTER — Ambulatory Visit: Payer: Medicare HMO | Admitting: Physician Assistant

## 2020-01-05 ENCOUNTER — Encounter: Payer: Self-pay | Admitting: Physician Assistant

## 2020-01-05 VITALS — BP 136/78 | HR 75 | Temp 98.1°F | Ht 66.0 in | Wt 222.0 lb

## 2020-01-05 DIAGNOSIS — E785 Hyperlipidemia, unspecified: Secondary | ICD-10-CM | POA: Diagnosis not present

## 2020-01-05 DIAGNOSIS — N184 Chronic kidney disease, stage 4 (severe): Secondary | ICD-10-CM

## 2020-01-05 DIAGNOSIS — I10 Essential (primary) hypertension: Secondary | ICD-10-CM

## 2020-01-05 DIAGNOSIS — I25810 Atherosclerosis of coronary artery bypass graft(s) without angina pectoris: Secondary | ICD-10-CM | POA: Diagnosis not present

## 2020-01-05 DIAGNOSIS — Z7189 Other specified counseling: Secondary | ICD-10-CM

## 2020-01-05 NOTE — Patient Instructions (Addendum)
Medication Instructions:  Your physician recommends that you continue on your current medications as directed. Please refer to the Current Medication list given to you today.  *If you need a refill on your cardiac medications before your next appointment, please call your pharmacy*   Lab Work: NONE   If you have labs (blood work) drawn today and your tests are completely normal, you will receive your results only by: Marland Kitchen MyChart Message (if you have MyChart) OR . A paper copy in the mail If you have any lab test that is abnormal or we need to change your treatment, we will call you to review the results.   Testing/Procedures: NONE    Follow-Up: At [facility] you and your health needs are our priority.  As part of our continuing mission to provide you with exceptional heart care, we have created designated Provider Care Teams.  These Care Teams include your primary Cardiologist (physician) and Advanced Practice Providers (APPs -  Physician Assistants and Nurse Practitioners) who all work together to provide you with the care you need, when you need it.  We recommend signing up for the patient portal called "MyChart".  Sign up information is provided on this After Visit Summary.  MyChart is used to connect with patients for Virtual Visits (Telemedicine).  Patients are able to view lab/test results, encounter notes, upcoming appointments, etc.  Non-urgent messages can be sent to your provider as well.   To learn more about what you can do with MyChart, go to [email or web address].    Your next appointment:    May or June   The format for your next appointment:   In Person  Provider:   Rozann Lesches, MD   Other [place] [phone]  Thank you for choosing [facility]!

## 2020-01-12 DIAGNOSIS — Z8249 Family history of ischemic heart disease and other diseases of the circulatory system: Secondary | ICD-10-CM | POA: Diagnosis not present

## 2020-01-12 DIAGNOSIS — Z951 Presence of aortocoronary bypass graft: Secondary | ICD-10-CM | POA: Diagnosis not present

## 2020-01-12 DIAGNOSIS — I739 Peripheral vascular disease, unspecified: Secondary | ICD-10-CM | POA: Diagnosis not present

## 2020-01-13 ENCOUNTER — Other Ambulatory Visit: Payer: Self-pay

## 2020-01-13 ENCOUNTER — Encounter: Payer: Self-pay | Admitting: Internal Medicine

## 2020-01-13 ENCOUNTER — Ambulatory Visit (INDEPENDENT_AMBULATORY_CARE_PROVIDER_SITE_OTHER): Payer: Medicare HMO

## 2020-01-13 ENCOUNTER — Ambulatory Visit: Payer: Medicare HMO | Admitting: Internal Medicine

## 2020-01-13 VITALS — BP 126/76 | HR 54 | Temp 98.7°F | Ht 66.0 in | Wt 223.0 lb

## 2020-01-13 DIAGNOSIS — J189 Pneumonia, unspecified organism: Secondary | ICD-10-CM | POA: Diagnosis not present

## 2020-01-13 DIAGNOSIS — J45991 Cough variant asthma: Secondary | ICD-10-CM | POA: Diagnosis not present

## 2020-01-13 NOTE — Assessment & Plan Note (Signed)
Onset  2014 worse since Jan 2021  - increase gabapentin to 400 mg qid 11/20/2019 and add pepcid 20 mg bid - 01/13/2020 still coughing on 400 mg bid - 01/13/2020  After extensive coaching inhaler device,  effectiveness =    50% from a baseline of nearly 0   Very difficult to tease out the real asthma vs the pseudowheezing which may be related to poorly controlled gerd in pt who says she "can't take any form of ppi" but the lack of previous response to Symbicort may be simply because she does not know how to inhale it properly.   She has follow-up PFTs and I would like to have them done before she restarts the Symbicort but it for symptoms worsen she should use the lower strength because of the upper airway component which may be exacerbated by the higher strength and in the meantime follow the diet and use maximum Pepcid dosing.         Each maintenance medication was reviewed in detail including emphasizing most importantly the difference between maintenance and prns and under what circumstances the prns are to be triggered using an action plan format where appropriate.  Total time for H and P, chart review, counseling, teaching device and generating customized AVS unique to this office visit / charting = 30 min

## 2020-01-13 NOTE — Assessment & Plan Note (Signed)
cxr clear, no further directed therapy or f/u needed

## 2020-01-13 NOTE — Patient Instructions (Addendum)
Work on inhaler technique:  relax and gently blow all the way out then take a nice smooth deep breath back in, triggering the inhaler at same time you start breathing in.  Hold for up to 5 seconds if you can.  . Rinse and gargle with water when done  If you start feeling like you need more albuterol, I'd like to start you back on symbicort 80 Take 2 puffs first thing in am and then another 2 puffs about 12 hours later.   Keep your appt for your PFT as planned

## 2020-01-13 NOTE — Progress Notes (Signed)
Donna Howe, female    DOB: 08-15-1956,    MRN: [id number]   Brief patient profile:  35 yowf quit smoking 2014 with "bronchitis" = wheezing/ cough/ sob sev times a year since her 33s seems to come on early in winter and goes away with usually   both pred/ abx and sometimes saba  but in Jan 2021 treated the same as before and got better x for cough / did not respond to symbicort then admitted [place] for cp  Admit date: 10/01/2019 Discharge date: 10/03/2019     Recommendations for Outpatient Follow-up:  1. Prior to admission, patient had been put on a steroid taper as well as oral doxycycline.  Would recommend she actually complete both of these.   Discharge Diagnoses:      Active Hospital Problems   Diagnosis Date Noted  . Chest pain 10/01/2019  . Elevated troponin 10/02/2019  . Hypothyroidism 04/18/2018  . AKI (acute kidney injury) ([place]) 10/26/2013  . Diabetes mellitus with renal manifestation ([place]) 01/21/2013  . Chronic kidney disease (CKD), stage III (moderate) (HCC)   . Asthma 08/26/2009  . Obesity (BMI 30-39.9) 08/26/2009  . CAD (coronary artery disease) 03/31/2007  . Essential hypertension 03/31/2007       History of present illness:  64 year old female with past medical history of obesity, CAD status post CABG 15 years ago with multiple PCI's since then possible non-STEMI in July 2019 as well as stage III chronic kidney disease and diabetes mellitus who presented with chest pain. In the emergency room, found to have mildly elevated troponin and states that she has been taking her aspirin and Plavix. She states that time she has missed doses of hydralazine. Also on admission, patient's creatinine elevated from baseline around 2.2 up to 3.02.  Following admission, repeat troponins much more elevated,going from 16 on admission up to 104 within 8 hours  Hospital Course:  Principal Problem: Chest painin the setting of elevated troponins and extensive CAD  history: Seen by cardiology. With her elevated creatinine, limited options for cath.   Echocardiogram unrevealing.  By 1/15, creatinine down to 2.67.  Her troponin had actually also trended down to 66.  In discussion with cardiology, they felt that elevated troponin may be more likely related to her respiratory bronchitis I did not feel that she would need cardiac intervention.  They recommended continuing her home medications   Bronchitis: Likely cause of patient's cough and chest pain.  Her lung exam is clear and she has no fever.  Normal white count.  Would recommend she complete the course of antibiotics and steroids that were started prior to her hospitalization though.  active Problems: Obesity (BMI 30-39.9): Meets criteria for BMI greater than 30.  Malignanthypertension: It is possible some of this could be secondary to fluid retention.   Patient's blood pressure much improved by increasing her Imdur and Norvasc.  Stressed the importance of compliance with her hydralazine which she says she will do.     AKI in the setting of chronic kidney disease (CKD), stage III (moderate) ([place]): See above.  By following day, down to 2.8.  Much improved, closer to baseline by day of discharge.  At 2.67 this morning and patient continued to get IV fluids until time of discharge.  When she follows with her PCP, repeat basic metabolic panel done at that time.  Diabetes mellitus with renal manifestation (HCC)/stage III chronic kidney disease: A1c notes good control at 6.8  Hypothyroidism: Continue Synthroid  History of Present Illness  11/20/2019  Pulmonary/ 1st office eval/Donna Howe  Chief Complaint  Patient presents with  . Pulmonary Consult    Referred by Dr. Rory Percy. Pt c/o cough since Jan 2021. She states has hx of having bronchitis. Her cough is occ prod with white sputum.   Dyspnea:  MMRC3 = can't walk 100 yards even at a slow pace at a flat grade s stopping due to sob and  leg pain  Cough: sporadic / variably productive  Day > noct Sleep: on side/ bed is flat, 3 pillows SABA no better / also no better on prednisone per Dr Lyman Speller note/ ? Some better on symbicort 160 Did not take ppi due to renal concerns rec Increase gabapentin to 400 mg four times a day until you stop coughing  Pepcid 20 mg after bfast and supper  GERD diet Please schedule a follow up office visit in 4 weeks, sooner if needed       Admit date: 12/17/2019 Discharge date: 12/19/2019  Brief Hospitalization Summary: Please see all hospital notes, images, labs for full details of the hospitalization. HPI:  64 y.o.femalewith medical history significant forCABG, diabetes mellitus, hypertension, CKD 3, peripheral artery disease. Patient went to see her PCP today for above complaints, had a chest x-ray done and was subsequently referred to the ED for"pneumoniaand fluid". Patient reports onset of difficulty breathing and cough productive of yellowish-green sputum that. She reports fevers up to 102 yesterday. Reports nausea with dry heaving, and poor p.o. intake over the past few days but no real vomiting, no abdominal pain,no loose stools.   Patient reportsleft-sided chest pain radiating down her left arm,that she noticed about a week ago when she went to the beach. Chest pain was worse with activity -walking, and improved with rest. Described chest pain as pressure-like, but different from chest pain she had whenshe had her heart attacks. Since she came back on Sunday from the beach, she has not had recurrence of her chest pain.  She reports weight gain over the past few weeks, stable and unchanged lower extremity swelling.  Hospitalization 1/13-1/15/36for acute kidney injury,bronchitis,malignant hypertensionandChest pain. Renal function improved with IV fluids, also with chest pain with mild elevation in troponin peaked at 104, down trended to 66. Managed conservatively and  thought secondary to respiratory bronchitis.  ED Course:86% on room air improved with 2 L O2 to greater than 99%. Blood pressure systolic 696E to 952W. T-max 99.7. WBC 9.2. Creatinine elevated at 3.47. Lactic acid normal 1.3. Chest x-ray showed consistent with pneumonia lateral right base increased from earlier in the day. Repeated troponin 384 >> 483.EKG showed some ST abnormalities in leads II, III, aVF, V5 through V6. EDPtalked to cardiologist Dr. Bronson Ing, elevated troponin likely secondary to pneumonia, no immediate cardiology intervention needed at this time. IV ceftriaxone given in ED. Hospitalist to admit for further evaluation and management.  Brief Admission Hx: 64 year old female with stage III CKD, hypertension, CAD status post CABG, diabetes mellitus and peripheral artery disease presented complaining of shortness of breath and diagnosed with pneumonia.  MDM/Assessment & Plan:  1. Acute respiratory failure with hypoxia secondary to community acquired pneumonia-patient is slowly improving with treatments. Pt was treated with IV antibiotics with good results. Supplemental oxygen ordered as needed in hospital. Wean to room air.  Covid test negative.  Clinically patient is improved. Doxycycline 100 mg BID prescribed for 4 more days to complete full course of therapy.  2. Acute renal failure on superimposed CKD stage  IIIb-patient reports that she recently had a renal biopsy done consistent with diabetic nephropathy. She has been treated with gentle IV fluid hydration and some improvement noted. Nephrology consult requested. 3. Asthma-she has been resumed on bronchodilator therapy. Follow clinically. 4. Atypical chest pain-secondary to pneumonia. Symptoms suggestive of pleuritic chest pain. Symptoms resolved this morning. Continue home cardiac medications. 5. Peripheral artery disease and carotid artery disease-patient was recently seen by vascular surgery and they  plan to follow her closely. She will be seen in the next 6 months. She does have claudication symptoms and they are considering starting Pletal.  She will follow up with her vascular surgeon regarding this.  6. Essential hypertension-stable on home medications follow-up. 7. Type 2 diabetes mellitus, controlled. Most recent A1c 6.8%. She is on supplemental sliding scale insulin and home 70/30 insulin.    Discharge Diagnoses:    Pneumonia   Essential hypertension   Asthma   Chronic kidney disease (CKD), stage III (moderate) ([place])   Diabetes mellitus with renal manifestation (HCC)   Hx of CABG   AKI (acute kidney injury) ([place])   01/13/2020  f/u ov/Donna Howe re: cough x 6 years sporadic dry worse in jan 2021  Chief Complaint  Patient presents with  . Follow-up    Cough is unchanged since the last visit. No new co's. She is using her albuterol inhaler 3 x per wk on average.   Dyspnea:  MMRC1 = can walk nl pace, flat grade, can't hurry or go uphills or steps s sob   Cough: not coughing daily unless wearing mask - says cough waxes and wanes x years worse in winter Sleeping: on side /bed is flat 3pillows SABA use: says helps some though technique is very poor  02: none      No obvious day to day or daytime variability or assoc excess/ purulent sputum or mucus plugs or hemoptysis or cp or chest tightness, subjective wheeze or overt sinus or hb symptoms.   Sleeping as above without nocturnal  or early am exacerbation  of respiratory  c/o's or need for noct saba. Also denies any obvious fluctuation of symptoms with weather or environmental changes or other aggravating or alleviating factors except as outlined above   No unusual exposure hx or h/o childhood pna/ asthma or knowledge of premature birth.  Current Allergies, Complete Past Medical History, Past Surgical History, Family History, and Social History were reviewed in [employer] record.  ROS  The following  are not active complaints unless bolded Hoarseness, sore throat, dysphagia, dental problems, itching, sneezing,  nasal congestion or discharge of excess mucus or purulent secretions, ear ache,   fever, chills, sweats, unintended wt loss or wt gain, classically pleuritic or exertional cp,  orthopnea pnd or arm/hand swelling  or leg swelling, presyncope, palpitations, abdominal pain, anorexia, nausea, vomiting, diarrhea  or change in bowel habits or change in bladder habits, change in stools or change in urine, dysuria, hematuria,  rash, arthralgias, visual complaints, headache, numbness, weakness or ataxia or problems with walking or coordination,  change in mood or  memory.        Current Meds  Medication Sig  . Albuterol Sulfate 108 (90 Base) MCG/ACT AEPB Inhale 2 puffs into the lungs every 4 (four) hours as needed (Shortness of breath).   . ALPRAZolam (XANAX) 0.5 MG tablet Take 0.25-0.5 mg by mouth See admin instructions. Take 0.25 mg tab by mouth every morning & evening as needed  and .05 mg tab at  bedtime  . amLODipine (NORVASC) 10 MG tablet Take 10 mg by mouth daily.   Marland Kitchen aspirin EC 81 MG tablet Take 81 mg by mouth daily.   Marland Kitchen atorvastatin (LIPITOR) 80 MG tablet Take 1 tablet (80 mg total) by mouth every evening.  . clopidogrel (PLAVIX) 75 MG tablet Take 1 tablet (75 mg total) by mouth daily.  . colchicine 0.6 MG tablet Take 0.6 mg by mouth daily.   . famotidine (PEPCID) 20 MG tablet One after bfast and supper (Patient taking differently: Take 20 mg by mouth 2 (two) times daily. One after bfast and supper)  . ferrous sulfate 325 (65 FE) MG tablet Take 325 mg by mouth daily with breakfast.  . gabapentin (NEURONTIN) 400 MG capsule Take 1 capsule (400 mg total) by mouth 2 x  times daily.  . hydrALAZINE (APRESOLINE) 50 MG tablet Take 75 mg by mouth in the morning and at bedtime.   . isosorbide dinitrate (ISORDIL) 5 MG tablet Take 5 mg by mouth 2 (two) times daily.  Marland Kitchen levothyroxine (SYNTHROID,  LEVOTHROID) 125 MCG tablet Take 125 mcg by mouth daily.   . nitroGLYCERIN (NITROSTAT) 0.4 MG SL tablet Place 0.4 mg under the tongue every 5 (five) minutes as needed for chest pain. Not to exceed 3 in 15 minute time frame  . NOVOLIN 70/30 RELION (70-30) 100 UNIT/ML injection Inject 24 Units into the skin 2 (two) times daily with a meal. Am & PM  . ondansetron (ZOFRAN ODT) 8 MG disintegrating tablet Take 1 tablet (8 mg total) by mouth every 8 (eight) hours as needed for nausea or vomiting.  . Vitamin D, Ergocalciferol, (DRISDOL) 1.25 MG (50000 UNIT) CAPS capsule Take 50,000 Units by mouth once a week.                Past Medical History:  Diagnosis Date  . Anemia   . Asthma   . CAD (coronary artery disease)    Multivessel s/p CABG 2005, numerous PCIs since that time  . Carotid artery disease (HCC)    R CEA  . Chronic kidney disease (CKD), stage III (moderate)   . Essential hypertension   . Gout   . Hyperlipidemia   . Hypothyroidism   . Myocardial infarction ([place])   . PAD (peripheral artery disease) ([place])    Dr. Kellie Simmering  . S/P angioplasty with stent- DES to [facility] and to [facility] to LAD with DES 04/09/18.   04/10/2018  . SBO (small bowel obstruction) ([place]) 2011   Status post lysis of adhesions & hernia repair  . Sinus bradycardia   . Thrombocytopenia, unspecified ([place])   . Type 2 diabetes mellitus ([place])   . Umbilical hernia        Objective:     amb obese wf nad  Wt Readings from Last 3 Encounters:  01/13/20 223 lb (101.2 kg)  01/05/20 222 lb (100.7 kg)  12/22/19 221 lb 4.8 oz (100.4 kg)    Vital signs reviewed  01/13/2020  - Note at rest 02 sats  96% on RA    HEENT : pt wearing mask not removed for exam due to covid -19 concerns.    NECK :  without JVD/Nodes/TM/ nl carotid upstrokes bilaterally   LUNGS: no acc muscle use,  Nl contour chest trace insp/exp rhonchi bilaterally without cough on insp or exp maneuvers   CV:  RRR  no s3 or murmur or increase in P2, and  no edema   ABD:  Mod obese/ soft and  nontender with nl inspiratory excursion in the supine position. No bruits or organomegaly appreciated, bowel sounds nl  MS:  Nl gait/ ext warm without deformities, calf tenderness, cyanosis or clubbing No obvious joint restrictions   SKIN: warm and dry without lesions    NEURO:  alert, approp, nl sensorium with  no motor or cerebellar deficits apparent.         CXR PA and Lateral:   01/13/2020 :    I personally reviewed images and impression as follows:   No as dz            Assessment

## 2020-01-14 NOTE — Progress Notes (Signed)
Spoke with pt and notified of results per Dr. Wert. Pt verbalized understanding and denied any questions. 

## 2020-01-16 DIAGNOSIS — E7849 Other hyperlipidemia: Secondary | ICD-10-CM | POA: Diagnosis not present

## 2020-01-16 DIAGNOSIS — I10 Essential (primary) hypertension: Secondary | ICD-10-CM | POA: Diagnosis not present

## 2020-01-23 ENCOUNTER — Other Ambulatory Visit: Payer: Self-pay | Admitting: Internal Medicine

## 2020-01-23 DIAGNOSIS — J45991 Cough variant asthma: Secondary | ICD-10-CM

## 2020-01-23 DIAGNOSIS — Z20828 Contact with and (suspected) exposure to other viral communicable diseases: Secondary | ICD-10-CM | POA: Diagnosis not present

## 2020-01-26 ENCOUNTER — Ambulatory Visit (INDEPENDENT_AMBULATORY_CARE_PROVIDER_SITE_OTHER): Payer: Medicare HMO | Admitting: Internal Medicine

## 2020-01-26 ENCOUNTER — Other Ambulatory Visit: Payer: Self-pay

## 2020-01-26 DIAGNOSIS — J45991 Cough variant asthma: Secondary | ICD-10-CM | POA: Diagnosis not present

## 2020-01-26 LAB — PULMONARY FUNCTION TEST
DL/VA % pred: 85 %
DL/VA: 3.57 ml/min/mmHg/L
DLCO unc % pred: 66 %
DLCO unc: 14.08 ml/min/mmHg
FEF 25-75 Post: 1.34 L/sec
FEF 25-75 Pre: 0.99 L/sec
FEF2575-%Change-Post: 35 %
FEF2575-%Pred-Post: 55 %
FEF2575-%Pred-Pre: 41 %
FEV1-%Change-Post: 9 %
FEV1-%Pred-Post: 59 %
FEV1-%Pred-Pre: 54 %
FEV1-Post: 1.58 L
FEV1-Pre: 1.45 L
FEV1FVC-%Change-Post: -1 %
FEV1FVC-%Pred-Pre: 92 %
FEV6-%Change-Post: 10 %
FEV6-%Pred-Post: 65 %
FEV6-%Pred-Pre: 59 %
FEV6-Post: 2.2 L
FEV6-Pre: 2 L
FEV6FVC-%Change-Post: 0 %
FEV6FVC-%Pred-Post: 103 %
FEV6FVC-%Pred-Pre: 103 %
FVC-%Change-Post: 10 %
FVC-%Pred-Post: 63 %
FVC-%Pred-Pre: 57 %
FVC-Post: 2.22 L
FVC-Pre: 2.01 L
Post FEV1/FVC ratio: 71 %
Post FEV6/FVC ratio: 99 %
Pre FEV1/FVC ratio: 72 %
Pre FEV6/FVC Ratio: 100 %
RV % pred: 85 %
RV: 1.77 L
TLC % pred: 73 %
TLC: 3.87 L

## 2020-01-26 NOTE — Progress Notes (Signed)
PFT completed today.  

## 2020-01-27 DIAGNOSIS — E559 Vitamin D deficiency, unspecified: Secondary | ICD-10-CM | POA: Diagnosis not present

## 2020-01-27 DIAGNOSIS — D529 Folate deficiency anemia, unspecified: Secondary | ICD-10-CM | POA: Diagnosis not present

## 2020-01-27 DIAGNOSIS — I10 Essential (primary) hypertension: Secondary | ICD-10-CM | POA: Diagnosis not present

## 2020-01-27 DIAGNOSIS — E7800 Pure hypercholesterolemia, unspecified: Secondary | ICD-10-CM | POA: Diagnosis not present

## 2020-01-27 DIAGNOSIS — E782 Mixed hyperlipidemia: Secondary | ICD-10-CM | POA: Diagnosis not present

## 2020-01-27 DIAGNOSIS — R69 Illness, unspecified: Secondary | ICD-10-CM | POA: Diagnosis not present

## 2020-01-27 DIAGNOSIS — E1140 Type 2 diabetes mellitus with diabetic neuropathy, unspecified: Secondary | ICD-10-CM | POA: Diagnosis not present

## 2020-01-27 DIAGNOSIS — D649 Anemia, unspecified: Secondary | ICD-10-CM | POA: Diagnosis not present

## 2020-01-27 DIAGNOSIS — D519 Vitamin B12 deficiency anemia, unspecified: Secondary | ICD-10-CM | POA: Diagnosis not present

## 2020-01-27 DIAGNOSIS — E039 Hypothyroidism, unspecified: Secondary | ICD-10-CM | POA: Diagnosis not present

## 2020-01-29 DIAGNOSIS — I10 Essential (primary) hypertension: Secondary | ICD-10-CM | POA: Diagnosis not present

## 2020-01-29 DIAGNOSIS — Z8679 Personal history of other diseases of the circulatory system: Secondary | ICD-10-CM | POA: Diagnosis not present

## 2020-01-29 DIAGNOSIS — I739 Peripheral vascular disease, unspecified: Secondary | ICD-10-CM | POA: Diagnosis not present

## 2020-01-29 DIAGNOSIS — N289 Disorder of kidney and ureter, unspecified: Secondary | ICD-10-CM | POA: Diagnosis not present

## 2020-01-29 DIAGNOSIS — Z6836 Body mass index (BMI) 36.0-36.9, adult: Secondary | ICD-10-CM | POA: Diagnosis not present

## 2020-01-29 DIAGNOSIS — R0989 Other specified symptoms and signs involving the circulatory and respiratory systems: Secondary | ICD-10-CM | POA: Diagnosis not present

## 2020-02-03 ENCOUNTER — Telehealth: Payer: Self-pay | Admitting: Internal Medicine

## 2020-02-03 NOTE — Telephone Encounter (Signed)
Assuming she was not taking any symbicort w/in a week of study, this does not suggest she has significant asthma but does not exclude the diagnosis either.  The test she really needs we can't do because of covid (methacholine challenge) so the other option if still having symptoms is start back on low dose symbicort = 80 one puff twice daily x 2 weeks then see me to regroup (higher doses are liable to trigger more cough)

## 2020-02-03 NOTE — Telephone Encounter (Signed)
Spoke with pt. She is requesting her PFT results from 01/26/20.  Dr. Melvyn Novas - please advise. Thanks.

## 2020-02-04 NOTE — Telephone Encounter (Signed)
Called and spoke with pt letting her know the info stated by MW and she verbalized understanding. Pt stated that her cough is better and has not had to use any Symb. Nothing further needed.

## 2020-03-17 DIAGNOSIS — Z794 Long term (current) use of insulin: Secondary | ICD-10-CM | POA: Diagnosis not present

## 2020-03-17 DIAGNOSIS — I70201 Unspecified atherosclerosis of native arteries of extremities, right leg: Secondary | ICD-10-CM | POA: Diagnosis not present

## 2020-03-17 DIAGNOSIS — E1165 Type 2 diabetes mellitus with hyperglycemia: Secondary | ICD-10-CM | POA: Diagnosis not present

## 2020-03-17 DIAGNOSIS — I10 Essential (primary) hypertension: Secondary | ICD-10-CM | POA: Diagnosis not present

## 2020-03-17 DIAGNOSIS — Z87891 Personal history of nicotine dependence: Secondary | ICD-10-CM | POA: Diagnosis not present

## 2020-03-19 ENCOUNTER — Ambulatory Visit (INDEPENDENT_AMBULATORY_CARE_PROVIDER_SITE_OTHER): Payer: Medicare HMO | Admitting: Cardiology

## 2020-03-19 ENCOUNTER — Encounter: Payer: Self-pay | Admitting: Cardiology

## 2020-03-19 ENCOUNTER — Encounter: Payer: Self-pay | Admitting: Pulmonary Disease

## 2020-03-19 VITALS — BP 142/78 | HR 62 | Ht 66.0 in | Wt 218.0 lb

## 2020-03-19 DIAGNOSIS — R69 Illness, unspecified: Secondary | ICD-10-CM | POA: Diagnosis not present

## 2020-03-19 DIAGNOSIS — I25119 Atherosclerotic heart disease of native coronary artery with unspecified angina pectoris: Secondary | ICD-10-CM

## 2020-03-19 DIAGNOSIS — N184 Chronic kidney disease, stage 4 (severe): Secondary | ICD-10-CM | POA: Diagnosis not present

## 2020-03-19 DIAGNOSIS — I10 Essential (primary) hypertension: Secondary | ICD-10-CM | POA: Diagnosis not present

## 2020-03-19 NOTE — Progress Notes (Signed)
Cardiology Office Note  Date: 03/19/2020   ID: Donna Howe, DOB July 16, 1956, MRN [id number]  PCP:  [facility]  Cardiologist:  Rozann Lesches, MD Electrophysiologist:  None   Chief Complaint  Patient presents with  . Cardiac follow-up    History of Present Illness: Donna Howe is a 64 y.o. female last seen in April by Ms. Bonnell Public PA-C.  She presents for a follow-up visit.  She does not describe any active angina at this time on medical therapy.  Continues to follow at [place] and had lab work recently which we are requesting for review.  She has had follow-up in the Pulmonary clinic with Dr. Melvyn Novas, I reviewed records.  We went over her cardiac medications which are stable and outlined below.  She does not describe any recent nitroglycerin use.  We are managing her medically with concurrent CKD stage IV, creatinine was 3.2 as of April.  She has a high risk of contrast nephropathy and cardiac catheterization is being avoided at this time.  Past Medical History:  Diagnosis Date  . Anemia   . Asthma   . CAD (coronary artery disease)    Multivessel s/p CABG 2005, numerous PCIs since that time  . Carotid artery disease (HCC)    R CEA  . Chronic kidney disease (CKD), stage III (moderate)   . Essential hypertension   . Gout   . Hyperlipidemia   . Hypothyroidism   . Myocardial infarction ([place])   . PAD (peripheral artery disease) ([place])    Dr. Kellie Simmering  . S/P angioplasty with stent- DES to [facility] and to LIMA to LAD with DES 04/09/18.   04/10/2018  . SBO (small bowel obstruction) ([place]) 2011   Status post lysis of adhesions & hernia repair  . Sinus bradycardia   . Thrombocytopenia, unspecified ([place])   . Type 2 diabetes mellitus ([place])   . Umbilical hernia     Past Surgical History:  Procedure Laterality Date  . CESAREAN SECTION  1984  . CHOLECYSTECTOMY  2010  . CORONARY ARTERY BYPASS GRAFT  2005  . CORONARY BALLOON ANGIOPLASTY N/A 05/31/2017   Procedure:  CORONARY BALLOON ANGIOPLASTY;  Surgeon: Jettie Booze, MD;  Location: [place] [facility];  Service: Cardiovascular;  Laterality: N/A;  . CORONARY STENT INTERVENTION N/A 05/31/2017   Procedure: CORONARY STENT INTERVENTION;  Surgeon: Jettie Booze, MD;  Location: [place] [facility];  Service: Cardiovascular;  Laterality: N/A;  . CORONARY STENT INTERVENTION N/A 04/09/2018   Procedure: CORONARY STENT INTERVENTION;  Surgeon: Jettie Booze, MD;  Location: [place] [facility];  Service: Cardiovascular;  Laterality: N/A;  SVG RCA  . ENDARTERECTOMY Right 04/18/2013   Procedure: ENDARTERECTOMY CAROTID;  Surgeon: Mal Misty, MD;  Location: [place];  Service: Vascular;  Laterality: Right;  . HERNIA REPAIR  1989  . Incisional hernia repair x2  03/04/2010   Laparoscopic with 35cm mesh by Dr Ronnald Collum  . LEFT HEART CATH AND CORS/GRAFTS ANGIOGRAPHY N/A 05/31/2017   Procedure: LEFT HEART CATH AND CORS/GRAFTS ANGIOGRAPHY;  Surgeon: Jettie Booze, MD;  Location: [place] [facility];  Service: Cardiovascular;  Laterality: N/A;  . LEFT HEART CATH AND CORS/GRAFTS ANGIOGRAPHY N/A 04/08/2018   Procedure: LEFT HEART CATH AND CORS/GRAFTS ANGIOGRAPHY;  Surgeon: Jettie Booze, MD;  Location: [place] [facility];  Service: Cardiovascular;  Laterality: N/A;  . LEFT HEART CATHETERIZATION WITH CORONARY ANGIOGRAM N/A 12/19/2012   Procedure: LEFT HEART CATHETERIZATION WITH CORONARY ANGIOGRAM;  Surgeon:  Josue Hector, MD;  Location: [facility] CATH LAB;  Service: Cardiovascular;  Laterality: N/A;  . LEFT HEART CATHETERIZATION WITH CORONARY/GRAFT ANGIOGRAM N/A 04/19/2013   Procedure: LEFT HEART CATHETERIZATION WITH Beatrix Fetters;  Surgeon: Lorretta Harp, MD;  Location: [facility] C[facility] LAB;  Service: Cardiovascular;  Laterality: N/A;  . PATCH ANGIOPLASTY Right 04/18/2013   Procedure: PATCH ANGIOPLASTY Right Internal Carotid Artery;  Surgeon: Mal Misty, MD;  Location: [place];  Service: Vascular;  Laterality:  Right;  . PERCUTANEOUS CORONARY STENT INTERVENTION (PCI-S) Right 12/19/2012   Procedure: PERCUTANEOUS CORONARY STENT INTERVENTION (PCI-S);  Surgeon: Josue Hector, MD;  Location: [facility] CATH LAB;  Service: Cardiovascular;  Laterality: Right;  . SHOULDER SURGERY      Current Outpatient Medications  Medication Sig Dispense Refill  . Albuterol Sulfate 108 (90 Base) MCG/ACT AEPB Inhale 2 puffs into the lungs every 4 (four) hours as needed (Shortness of breath).     . ALPRAZolam (XANAX) 0.5 MG tablet Take 0.25-0.5 mg by mouth See admin instructions. Take 0.25 mg tab by mouth every morning & evening as needed  and .05 mg tab at bedtime    . amLODipine (NORVASC) 10 MG tablet Take 10 mg by mouth daily.     Marland Kitchen aspirin EC 81 MG tablet Take 81 mg by mouth daily.     Marland Kitchen atorvastatin (LIPITOR) 80 MG tablet Take 1 tablet (80 mg total) by mouth every evening.    . clopidogrel (PLAVIX) 75 MG tablet Take 1 tablet (75 mg total) by mouth daily. 90 tablet 3  . colchicine 0.6 MG tablet Take 0.6 mg by mouth daily.     . famotidine (PEPCID) 20 MG tablet One after bfast and supper (Patient taking differently: Take 20 mg by mouth 2 (two) times daily. One after bfast and supper) 60 tablet 11  . ferrous sulfate 325 (65 FE) MG tablet Take 325 mg by mouth daily with breakfast.    . gabapentin (NEURONTIN) 400 MG capsule Take 1 capsule (400 mg total) by mouth 4 (four) times daily. 120 capsule 2  . hydrALAZINE (APRESOLINE) 50 MG tablet Take 75 mg by mouth in the morning and at bedtime.     . isosorbide dinitrate (ISORDIL) 5 MG tablet Take 5 mg by mouth 2 (two) times daily.    Marland Kitchen levothyroxine (SYNTHROID, LEVOTHROID) 125 MCG tablet Take 125 mcg by mouth daily.     . nitroGLYCERIN (NITROSTAT) 0.4 MG SL tablet Place 0.4 mg under the tongue every 5 (five) minutes as needed for chest pain. Not to exceed 3 in 15 minute time frame    . NOVOLIN 70/30 RELION (70-30) 100 UNIT/ML injection Inject 24 Units into the skin 2 (two) times daily with  a meal. Am & PM    . ondansetron (ZOFRAN ODT) 8 MG disintegrating tablet Take 1 tablet (8 mg total) by mouth every 8 (eight) hours as needed for nausea or vomiting. 20 tablet 0  . Vitamin D, Ergocalciferol, (DRISDOL) 1.25 MG (50000 UNIT) CAPS capsule Take 50,000 Units by mouth once a week.    . cilostazol (PLETAL) 100 MG tablet Take 1 tablet (100 mg total) by mouth 2 (two) times daily before a meal. (Patient not taking: Reported on 01/13/2020) 60 tablet 11   No current facility-administered medications for this visit.   Allergies:  Penicillins   ROS:  No palpitations or syncope.  Physical Exam: VS:  BP (!) 142/78   Pulse 62   Ht 5\' 6"  (1.676 m)  Wt 218 lb (98.9 kg)   SpO2 95%   BMI 35.19 kg/m , BMI Body mass index is 35.19 kg/m.  Wt Readings from Last 3 Encounters:  03/19/20 218 lb (98.9 kg)  01/13/20 223 lb (101.2 kg)  01/05/20 222 lb (100.7 kg)    General: Patient appears comfortable at rest. HEENT: Conjunctiva and lids normal, wearing a mask. Neck: Supple, no elevated JVP or carotid bruits, no thyromegaly. Lungs: Clear to auscultation, nonlabored breathing at rest. Cardiac: Regular rate and rhythm, no S3, 2/6 systolic murmur, no pericardial rub. Extremities: No pitting edema, distal pulses 2+.  ECG:  An ECG dated 12/17/2019 was personally reviewed today and demonstrated:  Sinus rhythm with IVCD and repolarization abnormalities.  Recent Labwork: 12/17/2019: ALT 26; AST 18; B Natriuretic Peptide 713.0 12/19/2019: BUN 43; Creatinine, Ser 3.22; Hemoglobin 9.1; Magnesium 1.9; Platelets 156; Potassium 4.5; Sodium 141     Component Value Date/Time   CHOL 160 10/02/2019 0425   TRIG 186 (H) 10/02/2019 0425   HDL 44 10/02/2019 0425   CHOLHDL 3.6 10/02/2019 0425   VLDL 37 10/02/2019 0425   LDLCALC 79 10/02/2019 0425    Other Studies Reviewed Today:  [facility] Myoview 10/06/2015:  There was no ST segment deviation noted during stress.  This is a low risk study.  The left  ventricular ejection fraction is normal (55-65%).  Inferolateral defect probably secondary to adjacent gut radiotracer uptake, cannot rule moderate inferolateral infarct with mild-periinfarct ischemia. The inferolateral wall has normal wall motion. Overall imaging findings support low risk.  Echocardiogram 10/02/2019: 1. Left ventricular ejection fraction, by visual estimation, is 60 to  65%. The left ventricle has normal function. There is mildly increased  left ventricular hypertrophy.  2. Left ventricular diastolic parameters are indeterminate.  3. The left ventricle has no regional wall motion abnormalities.  4. Global right ventricle has normal systolic function.The right  ventricular size is normal. No increase in right ventricular wall  thickness.  5. Left atrial size was normal.  6. Right atrial size was normal.  7. The mitral valve is normal in structure. Trivial mitral valve  regurgitation. No evidence of mitral stenosis.  8. The tricuspid valve is not well visualized.  9. The aortic valve is normal in structure. Aortic valve regurgitation is  not visualized. No evidence of aortic valve sclerosis or stenosis.  10. The pulmonic valve was not well visualized. Pulmonic valve  regurgitation is not visualized.   Chest x-ray 01/13/2020: FINDINGS: Cardiomegaly status post median sternotomy. Both lungs are clear. The visualized skeletal structures are unremarkable.  IMPRESSION: Cardiomegaly without acute abnormality of the lungs.  Assessment and Plan:  1.  Multivessel CAD disease status post CABG as well as subsequent DES to the mid RCA and DES to the LIMA to LAD.  We are managing her medically, no reported interval angina symptoms.  She has CKD stage IV with high risk of contrast nephropathy, avoiding cardiac catheterization if at all possible.  Continue aspirin, Plavix, Norvasc, Lipitor, hydralazine, and Isordil.  2.  Mixed hyperlipidemia, she remains on statin therapy.   Requesting interval lab work from [facility].  3.  CKD stage IV.  Medication Adjustments/Labs and Tests Ordered: Current medicines are reviewed at length with the patient today.  Concerns regarding medicines are outlined above.   Tests Ordered: No orders of the defined types were placed in this encounter.   Medication Changes: No orders of the defined types were placed in this encounter.   Disposition:  Follow up 6  months in the [place] office.  Signed, Satira Sark, MD, [facility] 03/19/2020 11:58 AM    [place] at [facility] [address], [place], [place] [address] Phone: [phone]; Fax: [phone]

## 2020-03-19 NOTE — Patient Instructions (Signed)
Medication Instructions:  °Your physician recommends that you continue on your current medications as directed. Please refer to the Current Medication list given to you today. ° °*If you need a refill on your cardiac medications before your next appointment, please call your pharmacy* ° ° °Lab Work: °None today °If you have labs (blood work) drawn today and your tests are completely normal, you will receive your results only by: °• MyChart Message (if you have MyChart) OR °• A paper copy in the mail °If you have any lab test that is abnormal or we need to change your treatment, we will call you to review the results. ° ° °Testing/Procedures: °None today ° ° °Follow-Up: °At [facility], you and your health needs are our priority.  As part of our continuing mission to provide you with exceptional heart care, we have created designated Provider Care Teams.  These Care Teams include your primary Cardiologist (physician) and Advanced Practice Providers (APPs -  Physician Assistants and Nurse Practitioners) who all work together to provide you with the care you need, when you need it. ° °We recommend signing up for the patient portal called "MyChart".  Sign up information is provided on this After Visit Summary.  MyChart is used to connect with patients for Virtual Visits (Telemedicine).  Patients are able to view lab/test results, encounter notes, upcoming appointments, etc.  Non-urgent messages can be sent to your provider as well.   °To learn more about what you can do with MyChart, go to [email or web address].   ° °Your next appointment:   °6 month(s) ° °The format for your next appointment:   °In Person ° °Provider:   °Samuel McDowell, MD ° ° °Other Instructions °None ° ° ° ° °Thank you for choosing [facility] ! ° ° ° ° ° ° ° ° °

## 2020-04-05 DIAGNOSIS — N2581 Secondary hyperparathyroidism of renal origin: Secondary | ICD-10-CM | POA: Diagnosis not present

## 2020-04-05 DIAGNOSIS — R809 Proteinuria, unspecified: Secondary | ICD-10-CM | POA: Diagnosis not present

## 2020-04-05 DIAGNOSIS — D631 Anemia in chronic kidney disease: Secondary | ICD-10-CM | POA: Diagnosis not present

## 2020-04-05 DIAGNOSIS — E1121 Type 2 diabetes mellitus with diabetic nephropathy: Secondary | ICD-10-CM | POA: Diagnosis not present

## 2020-04-05 DIAGNOSIS — N184 Chronic kidney disease, stage 4 (severe): Secondary | ICD-10-CM | POA: Diagnosis not present

## 2020-04-05 DIAGNOSIS — N189 Chronic kidney disease, unspecified: Secondary | ICD-10-CM | POA: Diagnosis not present

## 2020-04-16 DIAGNOSIS — E1165 Type 2 diabetes mellitus with hyperglycemia: Secondary | ICD-10-CM | POA: Diagnosis not present

## 2020-04-16 DIAGNOSIS — I70201 Unspecified atherosclerosis of native arteries of extremities, right leg: Secondary | ICD-10-CM | POA: Diagnosis not present

## 2020-04-16 DIAGNOSIS — Z794 Long term (current) use of insulin: Secondary | ICD-10-CM | POA: Diagnosis not present

## 2020-04-16 DIAGNOSIS — Z87891 Personal history of nicotine dependence: Secondary | ICD-10-CM | POA: Diagnosis not present

## 2020-04-16 DIAGNOSIS — I10 Essential (primary) hypertension: Secondary | ICD-10-CM | POA: Diagnosis not present

## 2020-05-07 DIAGNOSIS — E860 Dehydration: Secondary | ICD-10-CM | POA: Diagnosis not present

## 2020-05-07 DIAGNOSIS — K529 Noninfective gastroenteritis and colitis, unspecified: Secondary | ICD-10-CM | POA: Diagnosis not present

## 2020-05-07 DIAGNOSIS — Z20828 Contact with and (suspected) exposure to other viral communicable diseases: Secondary | ICD-10-CM | POA: Diagnosis not present

## 2020-05-07 DIAGNOSIS — N185 Chronic kidney disease, stage 5: Secondary | ICD-10-CM | POA: Diagnosis not present

## 2020-05-07 DIAGNOSIS — E1140 Type 2 diabetes mellitus with diabetic neuropathy, unspecified: Secondary | ICD-10-CM | POA: Diagnosis not present

## 2020-05-10 DIAGNOSIS — R300 Dysuria: Secondary | ICD-10-CM | POA: Diagnosis not present

## 2020-05-18 DIAGNOSIS — E1165 Type 2 diabetes mellitus with hyperglycemia: Secondary | ICD-10-CM | POA: Diagnosis not present

## 2020-05-18 DIAGNOSIS — I10 Essential (primary) hypertension: Secondary | ICD-10-CM | POA: Diagnosis not present

## 2020-05-18 DIAGNOSIS — Z794 Long term (current) use of insulin: Secondary | ICD-10-CM | POA: Diagnosis not present

## 2020-05-25 DIAGNOSIS — N184 Chronic kidney disease, stage 4 (severe): Secondary | ICD-10-CM | POA: Diagnosis not present

## 2020-05-31 DIAGNOSIS — R809 Proteinuria, unspecified: Secondary | ICD-10-CM | POA: Diagnosis not present

## 2020-05-31 DIAGNOSIS — E1121 Type 2 diabetes mellitus with diabetic nephropathy: Secondary | ICD-10-CM | POA: Diagnosis not present

## 2020-05-31 DIAGNOSIS — N2581 Secondary hyperparathyroidism of renal origin: Secondary | ICD-10-CM | POA: Diagnosis not present

## 2020-05-31 DIAGNOSIS — D631 Anemia in chronic kidney disease: Secondary | ICD-10-CM | POA: Diagnosis not present

## 2020-05-31 DIAGNOSIS — N185 Chronic kidney disease, stage 5: Secondary | ICD-10-CM | POA: Diagnosis not present

## 2020-06-01 ENCOUNTER — Other Ambulatory Visit: Payer: Self-pay

## 2020-06-01 DIAGNOSIS — N1832 Chronic kidney disease, stage 3b: Secondary | ICD-10-CM

## 2020-06-09 DIAGNOSIS — Z87891 Personal history of nicotine dependence: Secondary | ICD-10-CM | POA: Diagnosis not present

## 2020-06-09 DIAGNOSIS — J441 Chronic obstructive pulmonary disease with (acute) exacerbation: Secondary | ICD-10-CM | POA: Diagnosis not present

## 2020-06-09 DIAGNOSIS — Z20828 Contact with and (suspected) exposure to other viral communicable diseases: Secondary | ICD-10-CM | POA: Diagnosis not present

## 2020-06-11 DIAGNOSIS — J449 Chronic obstructive pulmonary disease, unspecified: Secondary | ICD-10-CM | POA: Diagnosis not present

## 2020-06-11 DIAGNOSIS — J189 Pneumonia, unspecified organism: Secondary | ICD-10-CM | POA: Diagnosis not present

## 2020-06-12 ENCOUNTER — Other Ambulatory Visit: Payer: Self-pay

## 2020-06-12 ENCOUNTER — Emergency Department (HOSPITAL_COMMUNITY): Payer: Medicare HMO

## 2020-06-12 ENCOUNTER — Encounter (HOSPITAL_COMMUNITY): Payer: Self-pay | Admitting: Emergency Medicine

## 2020-06-12 ENCOUNTER — Inpatient Hospital Stay (HOSPITAL_COMMUNITY)
Admission: EM | Admit: 2020-06-12 | Discharge: 2020-06-17 | DRG: 280 | Disposition: A | Discharge status: Home / Self Care | Payer: Medicare HMO | Attending: Internal Medicine | Admitting: Internal Medicine

## 2020-06-12 DIAGNOSIS — R072 Precordial pain: Secondary | ICD-10-CM | POA: Diagnosis not present

## 2020-06-12 DIAGNOSIS — Z880 Allergy status to penicillin: Secondary | ICD-10-CM

## 2020-06-12 DIAGNOSIS — I5020 Unspecified systolic (congestive) heart failure: Secondary | ICD-10-CM

## 2020-06-12 DIAGNOSIS — I82409 Acute embolism and thrombosis of unspecified deep veins of unspecified lower extremity: Secondary | ICD-10-CM

## 2020-06-12 DIAGNOSIS — R600 Localized edema: Secondary | ICD-10-CM | POA: Diagnosis not present

## 2020-06-12 DIAGNOSIS — Z83438 Family history of other disorder of lipoprotein metabolism and other lipidemia: Secondary | ICD-10-CM

## 2020-06-12 DIAGNOSIS — R140 Abdominal distension (gaseous): Secondary | ICD-10-CM

## 2020-06-12 DIAGNOSIS — Z6835 Body mass index (BMI) 35.0-35.9, adult: Secondary | ICD-10-CM | POA: Diagnosis not present

## 2020-06-12 DIAGNOSIS — J209 Acute bronchitis, unspecified: Secondary | ICD-10-CM | POA: Diagnosis present

## 2020-06-12 DIAGNOSIS — R079 Chest pain, unspecified: Secondary | ICD-10-CM | POA: Diagnosis not present

## 2020-06-12 DIAGNOSIS — E1122 Type 2 diabetes mellitus with diabetic chronic kidney disease: Secondary | ICD-10-CM | POA: Diagnosis present

## 2020-06-12 DIAGNOSIS — E1151 Type 2 diabetes mellitus with diabetic peripheral angiopathy without gangrene: Secondary | ICD-10-CM | POA: Diagnosis present

## 2020-06-12 DIAGNOSIS — Z8349 Family history of other endocrine, nutritional and metabolic diseases: Secondary | ICD-10-CM

## 2020-06-12 DIAGNOSIS — I132 Hypertensive heart and chronic kidney disease with heart failure and with stage 5 chronic kidney disease, or end stage renal disease: Secondary | ICD-10-CM | POA: Diagnosis present

## 2020-06-12 DIAGNOSIS — Z7902 Long term (current) use of antithrombotics/antiplatelets: Secondary | ICD-10-CM

## 2020-06-12 DIAGNOSIS — R0789 Other chest pain: Secondary | ICD-10-CM | POA: Diagnosis not present

## 2020-06-12 DIAGNOSIS — Z955 Presence of coronary angioplasty implant and graft: Secondary | ICD-10-CM

## 2020-06-12 DIAGNOSIS — Y929 Unspecified place or not applicable: Secondary | ICD-10-CM | POA: Diagnosis not present

## 2020-06-12 DIAGNOSIS — I200 Unstable angina: Secondary | ICD-10-CM | POA: Diagnosis present

## 2020-06-12 DIAGNOSIS — J9811 Atelectasis: Secondary | ICD-10-CM | POA: Diagnosis present

## 2020-06-12 DIAGNOSIS — J440 Chronic obstructive pulmonary disease with acute lower respiratory infection: Secondary | ICD-10-CM | POA: Diagnosis not present

## 2020-06-12 DIAGNOSIS — I10 Essential (primary) hypertension: Secondary | ICD-10-CM | POA: Diagnosis not present

## 2020-06-12 DIAGNOSIS — E039 Hypothyroidism, unspecified: Secondary | ICD-10-CM | POA: Diagnosis present

## 2020-06-12 DIAGNOSIS — I5032 Chronic diastolic (congestive) heart failure: Secondary | ICD-10-CM | POA: Diagnosis not present

## 2020-06-12 DIAGNOSIS — E1165 Type 2 diabetes mellitus with hyperglycemia: Secondary | ICD-10-CM

## 2020-06-12 DIAGNOSIS — E669 Obesity, unspecified: Secondary | ICD-10-CM

## 2020-06-12 DIAGNOSIS — E785 Hyperlipidemia, unspecified: Secondary | ICD-10-CM | POA: Diagnosis present

## 2020-06-12 DIAGNOSIS — D631 Anemia in chronic kidney disease: Secondary | ICD-10-CM | POA: Diagnosis present

## 2020-06-12 DIAGNOSIS — Z79899 Other long term (current) drug therapy: Secondary | ICD-10-CM

## 2020-06-12 DIAGNOSIS — Z79890 Hormone replacement therapy (postmenopausal): Secondary | ICD-10-CM

## 2020-06-12 DIAGNOSIS — I214 Non-ST elevation (NSTEMI) myocardial infarction: Secondary | ICD-10-CM

## 2020-06-12 DIAGNOSIS — N185 Chronic kidney disease, stage 5: Secondary | ICD-10-CM | POA: Diagnosis present

## 2020-06-12 DIAGNOSIS — Z794 Long term (current) use of insulin: Secondary | ICD-10-CM | POA: Diagnosis not present

## 2020-06-12 DIAGNOSIS — I209 Angina pectoris, unspecified: Secondary | ICD-10-CM | POA: Diagnosis not present

## 2020-06-12 DIAGNOSIS — Z8249 Family history of ischemic heart disease and other diseases of the circulatory system: Secondary | ICD-10-CM

## 2020-06-12 DIAGNOSIS — I25110 Atherosclerotic heart disease of native coronary artery with unstable angina pectoris: Secondary | ICD-10-CM | POA: Diagnosis present

## 2020-06-12 DIAGNOSIS — N179 Acute kidney failure, unspecified: Secondary | ICD-10-CM

## 2020-06-12 DIAGNOSIS — N178 Other acute kidney failure: Secondary | ICD-10-CM | POA: Diagnosis not present

## 2020-06-12 DIAGNOSIS — E875 Hyperkalemia: Secondary | ICD-10-CM | POA: Diagnosis present

## 2020-06-12 DIAGNOSIS — T380X5A Adverse effect of glucocorticoids and synthetic analogues, initial encounter: Secondary | ICD-10-CM | POA: Diagnosis present

## 2020-06-12 DIAGNOSIS — D638 Anemia in other chronic diseases classified elsewhere: Secondary | ICD-10-CM | POA: Diagnosis not present

## 2020-06-12 DIAGNOSIS — I130 Hypertensive heart and chronic kidney disease with heart failure and stage 1 through stage 4 chronic kidney disease, or unspecified chronic kidney disease: Secondary | ICD-10-CM | POA: Diagnosis not present

## 2020-06-12 DIAGNOSIS — J9601 Acute respiratory failure with hypoxia: Secondary | ICD-10-CM | POA: Diagnosis not present

## 2020-06-12 DIAGNOSIS — Z951 Presence of aortocoronary bypass graft: Secondary | ICD-10-CM

## 2020-06-12 DIAGNOSIS — R69 Illness, unspecified: Secondary | ICD-10-CM | POA: Diagnosis not present

## 2020-06-12 DIAGNOSIS — Z6838 Body mass index (BMI) 38.0-38.9, adult: Secondary | ICD-10-CM | POA: Diagnosis not present

## 2020-06-12 DIAGNOSIS — I252 Old myocardial infarction: Secondary | ICD-10-CM | POA: Diagnosis not present

## 2020-06-12 DIAGNOSIS — Z833 Family history of diabetes mellitus: Secondary | ICD-10-CM

## 2020-06-12 DIAGNOSIS — N184 Chronic kidney disease, stage 4 (severe): Secondary | ICD-10-CM | POA: Diagnosis not present

## 2020-06-12 DIAGNOSIS — Z87891 Personal history of nicotine dependence: Secondary | ICD-10-CM

## 2020-06-12 DIAGNOSIS — I70219 Atherosclerosis of native arteries of extremities with intermittent claudication, unspecified extremity: Secondary | ICD-10-CM | POA: Diagnosis present

## 2020-06-12 DIAGNOSIS — R0602 Shortness of breath: Secondary | ICD-10-CM | POA: Diagnosis not present

## 2020-06-12 DIAGNOSIS — R5383 Other fatigue: Secondary | ICD-10-CM | POA: Diagnosis not present

## 2020-06-12 DIAGNOSIS — Z7952 Long term (current) use of systemic steroids: Secondary | ICD-10-CM

## 2020-06-12 DIAGNOSIS — K219 Gastro-esophageal reflux disease without esophagitis: Secondary | ICD-10-CM

## 2020-06-12 DIAGNOSIS — Z20822 Contact with and (suspected) exposure to covid-19: Secondary | ICD-10-CM | POA: Diagnosis not present

## 2020-06-12 DIAGNOSIS — R042 Hemoptysis: Secondary | ICD-10-CM | POA: Diagnosis present

## 2020-06-12 DIAGNOSIS — D696 Thrombocytopenia, unspecified: Secondary | ICD-10-CM | POA: Diagnosis present

## 2020-06-12 DIAGNOSIS — Z7982 Long term (current) use of aspirin: Secondary | ICD-10-CM

## 2020-06-12 DIAGNOSIS — D469 Myelodysplastic syndrome, unspecified: Secondary | ICD-10-CM | POA: Diagnosis not present

## 2020-06-12 LAB — RESPIRATORY PANEL BY RT PCR (FLU A&B, COVID)
Influenza A by PCR: NEGATIVE
Influenza B by PCR: NEGATIVE
SARS Coronavirus 2 by RT PCR: NEGATIVE

## 2020-06-12 LAB — CBC
HCT: 35.6 % — ABNORMAL LOW (ref 36.0–46.0)
Hemoglobin: 11.5 g/dL — ABNORMAL LOW (ref 12.0–15.0)
MCH: 30.1 pg (ref 26.0–34.0)
MCHC: 32.3 g/dL (ref 30.0–36.0)
MCV: 93.2 fL (ref 80.0–100.0)
Platelets: 195 10*3/uL (ref 150–400)
RBC: 3.82 MIL/uL — ABNORMAL LOW (ref 3.87–5.11)
RDW: 14.9 % (ref 11.5–15.5)
WBC: 12.4 10*3/uL — ABNORMAL HIGH (ref 4.0–10.5)
nRBC: 0.2 % (ref 0.0–0.2)

## 2020-06-12 LAB — BASIC METABOLIC PANEL
Anion gap: 14 (ref 5–15)
BUN: 75 mg/dL — ABNORMAL HIGH (ref 8–23)
CO2: 21 mmol/L — ABNORMAL LOW (ref 22–32)
Calcium: 9 mg/dL (ref 8.9–10.3)
Chloride: 102 mmol/L (ref 98–111)
Creatinine, Ser: 4.77 mg/dL — ABNORMAL HIGH (ref 0.44–1.00)
GFR calc Af Amer: 11 mL/min — ABNORMAL LOW (ref >60)
GFR calc non Af Amer: 9 mL/min — ABNORMAL LOW (ref >60)
Glucose, Bld: 175 mg/dL — ABNORMAL HIGH (ref 70–99)
Potassium: 5.3 mmol/L — ABNORMAL HIGH (ref 3.5–5.1)
Sodium: 137 mmol/L (ref 135–145)

## 2020-06-12 LAB — TROPONIN I (HIGH SENSITIVITY): Troponin I (High Sensitivity): 20 ng/L — ABNORMAL HIGH (ref <18)

## 2020-06-12 MED ORDER — NITROGLYCERIN IN D5W 200-5 MCG/ML-% IV SOLN
5.0000 ug/min | INTRAVENOUS | Status: DC
  Administered 2020-06-12: 5 ug/min via INTRAVENOUS
  Administered 2020-06-14: 35 ug/min via INTRAVENOUS
  Filled 2020-06-12 (×2): qty 250

## 2020-06-12 NOTE — ED Notes (Signed)
Nitro paste placed by EMS to right chest has been Removed

## 2020-06-12 NOTE — ED Triage Notes (Addendum)
[facility] - c/o chest pain and fatigued through out the day. Pt took SL nitro at home, with 1 SL nitro, 324 ASA,  and nitro paste given by EMS.

## 2020-06-12 NOTE — ED Provider Notes (Signed)
[facility] EMERGENCY DEPARTMENT Provider Note   CSN: [id number] Arrival date & time: 06/12/20  2055     History Chief Complaint  Patient presents with   Chest Pain    Donna Howe is a 64 y.o. female.  HPI Patient presents with chest pain.  Has had throughout the day.  On the left side of the breast and going the arm.  States it will come on with exertion although she does get it at rest 2.  Does feel somewhat like her previous cardiac pain, although that was more in the chest.  Has had multiple stents and CABG.  States she also feels that she has a bronchitis.  States she has little bit of cough.  States her pain improved with nitroglycerin earlier today but then worsened when she had to walk back to the house    Past Medical History:  Diagnosis Date   Anemia    Asthma    CAD (coronary artery disease)    Multivessel s/p CABG 2005, numerous PCIs since that time   Carotid artery disease ([place])    R CEA   Chronic kidney disease (CKD), stage III (moderate)    Essential hypertension    Gout    Hyperlipidemia    Hypothyroidism    Myocardial infarction [facility])    PAD (peripheral artery disease) ([place])    Dr. Kellie Simmering   S/P angioplasty with stent- DES to [facility] and to [facility] to LAD with DES 04/09/18.   04/10/2018   SBO (small bowel obstruction) ([place]) 2011   Status post lysis of adhesions & hernia repair   Sinus bradycardia    Thrombocytopenia, unspecified (HCC)    Type 2 diabetes mellitus ([place])    Umbilical hernia     Patient Active Problem List   Diagnosis Date Noted   Pneumonia 12/17/2019   Cough variant asthma with component of UACS 11/20/2019   Elevated troponin 10/02/2019   Chest pain 10/01/2019   Unstable angina ([place]) 04/18/2018   Hypothyroidism 04/18/2018   S/P angioplasty with stent- DES to [facility] Xxx and to [facility] to LAD with DES 04/09/18.   04/10/2018   Angina pectoris ([place]) 04/05/2018   Status post coronary artery stent placement    ACS (acute  coronary syndrome) ([place]) 05/31/2017   Acute chest pain    Non-ST elevation (NSTEMI) myocardial infarction [facility])    History of coronary artery disease    Diabetes ([place]) 10/28/2013   Hypoglycemia associated with diabetes ([place]) 10/28/2013   Thrombocytopenia, unspecified ([place]) 10/28/2013   Hypokalemia 10/27/2013   Syncope 10/26/2013   Anemia 10/26/2013   AKI (acute kidney injury) ([place]) 10/26/2013   Fracture of toe of right foot 53/64/6803   Umbilical hernia    Carotid artery disease ([place])    Occlusion and stenosis of carotid artery without mention of cerebral infarction 04/15/2013   Hx of CABG    Ejection fraction    Atherosclerosis of native artery of extremity with intermittent claudication ([place]) 02/11/2013   Diabetes mellitus with renal manifestation ([place]) 01/21/2013   Bradycardia 01/03/2013   Chronic kidney disease (CKD), stage III (moderate) ([place])    Gout    PROTEINURIA, MILD 01/18/2010   Obesity (BMI 30-39.9) 08/26/2009   Asthma 08/26/2009   Hyperlipidemia 03/31/2007   Essential hypertension 03/31/2007   CAD (coronary artery disease) 03/31/2007    Past Surgical History:  Procedure Laterality Date   CESAREAN SECTION  1984   CHOLECYSTECTOMY  2010   CORONARY ARTERY BYPASS GRAFT  2005  CORONARY BALLOON ANGIOPLASTY N/A 05/31/2017   Procedure: CORONARY BALLOON ANGIOPLASTY;  Surgeon: Jettie Booze, MD;  Location: [place] CV LAB;  Service: Cardiovascular;  Laterality: N/A;   CORONARY STENT INTERVENTION N/A 05/31/2017   Procedure: CORONARY STENT INTERVENTION;  Surgeon: Jettie Booze, MD;  Location: [place] CV LAB;  Service: Cardiovascular;  Laterality: N/A;   CORONARY STENT INTERVENTION N/A 04/09/2018   Procedure: CORONARY STENT INTERVENTION;  Surgeon: Jettie Booze, MD;  Location: [place] CV LAB;  Service: Cardiovascular;  Laterality: N/A;  SVG RCA   ENDARTERECTOMY Right 04/18/2013   Procedure: ENDARTERECTOMY CAROTID;   Surgeon: Mal Misty, MD;  Location: [place];  Service: Vascular;  Laterality: Right;   HERNIA REPAIR  1989   Incisional hernia repair x2  03/04/2010   Laparoscopic with 35cm mesh by Dr Ronnald Collum   LEFT HEART CATH AND CORS/GRAFTS ANGIOGRAPHY N/A 05/31/2017   Procedure: LEFT HEART CATH AND CORS/GRAFTS ANGIOGRAPHY;  Surgeon: Jettie Booze, MD;  Location: [place] CV LAB;  Service: Cardiovascular;  Laterality: N/A;   LEFT HEART CATH AND CORS/GRAFTS ANGIOGRAPHY N/A 04/08/2018   Procedure: LEFT HEART CATH AND CORS/GRAFTS ANGIOGRAPHY;  Surgeon: Jettie Booze, MD;  Location: [place] CV LAB;  Service: Cardiovascular;  Laterality: N/A;   LEFT HEART CATHETERIZATION WITH CORONARY ANGIOGRAM N/A 12/19/2012   Procedure: LEFT HEART CATHETERIZATION WITH CORONARY ANGIOGRAM;  Surgeon: Josue Hector, MD;  Location: [facility] CATH LAB;  Service: Cardiovascular;  Laterality: N/A;   LEFT HEART CATHETERIZATION WITH CORONARY/GRAFT ANGIOGRAM N/A 04/19/2013   Procedure: LEFT HEART CATHETERIZATION WITH Beatrix Fetters;  Surgeon: Lorretta Harp, MD;  Location: [facility] CATH LAB;  Service: Cardiovascular;  Laterality: N/A;   PATCH ANGIOPLASTY Right 04/18/2013   Procedure: PATCH ANGIOPLASTY Right Internal Carotid Artery;  Surgeon: Mal Misty, MD;  Location: [place];  Service: Vascular;  Laterality: Right;   PERCUTANEOUS CORONARY STENT INTERVENTION (PCI-S) Right 12/19/2012   Procedure: PERCUTANEOUS CORONARY STENT INTERVENTION (PCI-S);  Surgeon: Josue Hector, MD;  Location: [facility] CATH LAB;  Service: Cardiovascular;  Laterality: Right;   SHOULDER SURGERY       OB History   No obstetric history on file.     Family History  Problem Relation Age of Onset   Diabetes Mother    Heart disease Mother        before age 60   Hyperlipidemia Mother    Hypertension Mother    Thyroid disease Father    Hypertension Father    AAA (abdominal aortic aneurysm) Father    Heart disease Brother         before age 18   Hypertension Brother    Hyperlipidemia Son    Hypertension Son     Social History   Tobacco Use   Smoking status: Former Smoker    Packs/day: 1.00    Years: 20.00    Pack years: 20.00    Types: Cigarettes    Quit date: 12/10/2012    Years since quitting: 7.5   Smokeless tobacco: Never Used  Vaping Use   Vaping Use: Never used  Substance Use Topics   Alcohol use: No    Alcohol/week: 0.0 standard drinks   Drug use: No    Home Medications Prior to Admission medications   Medication Sig Start Date End Date Taking? Authorizing Provider  ALPRAZolam Duanne Moron) 0.5 MG tablet Take 0.25-0.5 mg by mouth See admin instructions. Take 0.25 mg tab by mouth every morning & evening as needed  and .  05 mg tab at bedtime   Yes [provider]  amLODipine (NORVASC) 10 MG tablet Take 10 mg by mouth daily.    Yes [provider]  aspirin EC 81 MG tablet Take 81 mg by mouth daily.    Yes [provider]  atorvastatin (LIPITOR) 80 MG tablet Take 1 tablet (80 mg total) by mouth every evening. 12/19/19  Yes Johnson, Clanford L, MD  benzonatate (TESSALON) 100 MG capsule Take 100 mg by mouth 3 (three) times daily as needed for cough.   Yes [provider]  clopidogrel (PLAVIX) 75 MG tablet Take 1 tablet (75 mg total) by mouth daily. 08/27/18  Yes Satira Sark, MD  colchicine 0.6 MG tablet Take 0.6 mg by mouth daily.    Yes [provider]  famotidine (PEPCID) 20 MG tablet One after bfast and supper Patient taking differently: Take 20 mg by mouth 2 (two) times daily. One after bfast and supper 11/20/19  Yes Tanda Rockers, MD  ferrous sulfate 325 (65 FE) MG tablet Take 325 mg by mouth daily with breakfast.   Yes [provider]  gabapentin (NEURONTIN) 400 MG capsule Take 1 capsule (400 mg total) by mouth 4 (four) times daily. Patient taking differently: Take 400 mg by mouth 2 (two) times daily.  11/20/19  Yes Tanda Rockers, MD   hydrALAZINE (APRESOLINE) 50 MG tablet Take 75 mg by mouth in the morning and at bedtime.  06/10/19  Yes [provider]  ipratropium (ATROVENT HFA) 17 MCG/ACT inhaler Inhale 2 puffs into the lungs See admin instructions. Inhale 2 puffs every 4 to 6 hrs as needed.   Yes [provider]  isosorbide dinitrate (ISORDIL) 5 MG tablet Take 5 mg by mouth 2 (two) times daily.   Yes [provider]  levofloxacin (LEVAQUIN) 750 MG tablet Take 750 mg by mouth daily.   Yes [provider]  levothyroxine (SYNTHROID, LEVOTHROID) 125 MCG tablet Take 125 mcg by mouth daily.    Yes [provider]  nitroGLYCERIN (NITROSTAT) 0.4 MG SL tablet Place 0.4 mg under the tongue every 5 (five) minutes as needed for chest pain. Not to exceed 3 in 15 minute time frame   Yes [provider]  NOVOLIN 70/30 RELION (70-30) 100 UNIT/ML injection Inject 29 Units into the skin 2 (two) times daily with a meal. Am & PM 08/28/15  Yes [provider]  ondansetron (ZOFRAN ODT) 8 MG disintegrating tablet Take 1 tablet (8 mg total) by mouth every 8 (eight) hours as needed for nausea or vomiting. 08/14/15  Yes Idol, Almyra Free, PA-C  predniSONE (DELTASONE) 20 MG tablet Take 20 mg by mouth 2 (two) times daily.   Yes [provider]  sodium bicarbonate 650 MG tablet Take 650 mg by mouth 2 (two) times daily.   Yes [provider]  Albuterol Sulfate 108 (90 Base) MCG/ACT AEPB Inhale 2 puffs into the lungs every 4 (four) hours as needed (Shortness of breath).  Patient not taking: Reported on 06/12/2020    [provider]  cilostazol (PLETAL) 100 MG tablet Take 1 tablet (100 mg total) by mouth 2 (two) times daily before a meal. Patient not taking: Reported on 06/12/2020 12/16/19   Marty Heck, MD  Vitamin D, Ergocalciferol, (DRISDOL) 1.25 MG (50000 UNIT) CAPS capsule Take 50,000 Units by mouth once a week. 12/31/19   [provider]    Allergies     Penicillins  Review of Systems  Review of Systems  Constitutional: Negative for appetite change.  HENT: Negative for congestion.   Respiratory: Positive for cough. Negative for shortness of breath.   Cardiovascular: Positive for chest pain.  Gastrointestinal: Negative for abdominal distention.  Musculoskeletal: Negative for back pain.  Skin: Negative for rash.  Neurological: Negative for weakness.  Psychiatric/Behavioral: Negative for confusion.    Physical Exam Updated Vital Signs BP (!) 150/74    Pulse 86    Temp 97.9 F (36.6 C)    Resp 15    Ht 5\' 6"  (1.676 m)    Wt 98.9 kg    SpO2 94%    BMI 35.19 kg/m   Physical Exam Vitals and nursing note reviewed.  HENT:     Head: Atraumatic.  Cardiovascular:     Rate and Rhythm: Normal rate and regular rhythm.  Pulmonary:     Breath sounds: No wheezing, rhonchi or rales.  Chest:     Chest wall: No tenderness.  Abdominal:     Tenderness: There is no abdominal tenderness.  Musculoskeletal:     Right lower leg: No edema.     Left lower leg: No edema.  Skin:    General: Skin is warm.     Capillary Refill: Capillary refill takes less than 2 seconds.  Neurological:     Mental Status: She is alert and oriented to person, place, and time.     ED Results / Procedures / Treatments   Labs (all labs ordered are listed, but only abnormal results are displayed) Labs Reviewed  BASIC METABOLIC PANEL - Abnormal; Notable for the following components:      Result Value   Potassium 5.3 (*)    CO2 21 (*)    Glucose, Bld 175 (*)    BUN 75 (*)    Creatinine, Ser 4.77 (*)    GFR calc non Af Amer 9 (*)    GFR calc Af Amer 11 (*)    All other components within normal limits  CBC - Abnormal; Notable for the following components:   WBC 12.4 (*)    RBC 3.82 (*)    Hemoglobin 11.5 (*)    HCT 35.6 (*)    All other components within normal limits  TROPONIN I (HIGH SENSITIVITY) - Abnormal; Notable for the following components:   Troponin  I (High Sensitivity) 20 (*)    All other components within normal limits  RESPIRATORY PANEL BY RT PCR (FLU A&B, COVID)  TROPONIN I (HIGH SENSITIVITY)    EKG EKG Interpretation  Date/Time:  Saturday June 12 2020 21:09:36 EDT Ventricular Rate:  85 PR Interval:    QRS Duration: 116 QT Interval:  398 QTC Calculation: 474 R Axis:   65 Text Interpretation: Sinus rhythm Probable left atrial enlargement Nonspecific intraventricular conduction delay Repol abnrm suggests ischemia, diffuse leads No significant change since last tracing Confirmed by Davonna Belling [phone]) on 06/12/2020 9:19:09 PM   Radiology DG Chest 2 View  Result Date: 06/12/2020 CLINICAL DATA:  Chest pain and fatigue throughout the day. EXAM: CHEST - 2 VIEW COMPARISON:  06/11/2020 FINDINGS: Cardiac enlargement. No vascular congestion, edema, or consolidation. No pleural effusions. No pneumothorax. Mediastinal contours appear intact. Postoperative changes in the mediastinum. No significant changes since prior study. IMPRESSION: Cardiac enlargement.  No active pulmonary disease. Electronically Signed   By: Lucienne Capers M.D.   On: 06/12/2020 21:40    Procedures Procedures (including critical care time)  Medications Ordered in ED Medications  nitroGLYCERIN 50 mg in dextrose  5 % 250 mL (0.2 mg/mL) infusion (10 mcg/min Intravenous Rate/Dose Change 06/12/20 2240)    ED Course  I have reviewed the triage vital signs and the nursing notes.  Pertinent labs & imaging results that were available during my care of the patient were reviewed by me and considered in my medical decision making (see chart for details).    MDM Rules/Calculators/A&P                          Patient with left-sided chest/breast pain that goes to the arm.  Comes on with exertion.  Strong cardiac history.  EKG is abnormal but stable from prior.  Troponin only mildly elevated.  Had some improvement with nitroglycerin.  Will require admission to  the hospital.  Will discuss with hospitalist, however does have worsening creatinine up to 4.8.  Not an ideal candidate for IV dye load at this time.  Started on heparin drip and nitroglycerin drip.  CRITICAL CARE Performed by: Davonna Belling Total critical care time: 30 minutes Critical care time was exclusive of separately billable procedures and treating other patients. Critical care was necessary to treat or prevent imminent or life-threatening deterioration. Critical care was time spent personally by me on the following activities: development of treatment plan with patient and/or surrogate as well as nursing, discussions with consultants, evaluation of patient's response to treatment, examination of patient, obtaining history from patient or surrogate, ordering and performing treatments and interventions, ordering and review of laboratory studies, ordering and review of radiographic studies, pulse oximetry and re-evaluation of patient's condition.  Final Clinical Impression(s) / ED Diagnoses Final diagnoses:  None    Rx / DC Orders ED Discharge Orders    None       Davonna Belling, MD 06/12/20 [phone]

## 2020-06-13 ENCOUNTER — Observation Stay (HOSPITAL_COMMUNITY): Payer: Medicare HMO

## 2020-06-13 DIAGNOSIS — K219 Gastro-esophageal reflux disease without esophagitis: Secondary | ICD-10-CM | POA: Diagnosis not present

## 2020-06-13 DIAGNOSIS — R042 Hemoptysis: Secondary | ICD-10-CM | POA: Diagnosis not present

## 2020-06-13 DIAGNOSIS — N178 Other acute kidney failure: Secondary | ICD-10-CM | POA: Diagnosis not present

## 2020-06-13 DIAGNOSIS — I252 Old myocardial infarction: Secondary | ICD-10-CM | POA: Diagnosis not present

## 2020-06-13 DIAGNOSIS — D638 Anemia in other chronic diseases classified elsewhere: Secondary | ICD-10-CM | POA: Diagnosis not present

## 2020-06-13 DIAGNOSIS — E785 Hyperlipidemia, unspecified: Secondary | ICD-10-CM | POA: Diagnosis present

## 2020-06-13 DIAGNOSIS — R69 Illness, unspecified: Secondary | ICD-10-CM | POA: Diagnosis not present

## 2020-06-13 DIAGNOSIS — J9601 Acute respiratory failure with hypoxia: Secondary | ICD-10-CM | POA: Diagnosis not present

## 2020-06-13 DIAGNOSIS — N179 Acute kidney failure, unspecified: Secondary | ICD-10-CM | POA: Diagnosis not present

## 2020-06-13 DIAGNOSIS — E669 Obesity, unspecified: Secondary | ICD-10-CM | POA: Diagnosis not present

## 2020-06-13 DIAGNOSIS — I130 Hypertensive heart and chronic kidney disease with heart failure and stage 1 through stage 4 chronic kidney disease, or unspecified chronic kidney disease: Secondary | ICD-10-CM | POA: Diagnosis not present

## 2020-06-13 DIAGNOSIS — N185 Chronic kidney disease, stage 5: Secondary | ICD-10-CM | POA: Diagnosis not present

## 2020-06-13 DIAGNOSIS — R072 Precordial pain: Secondary | ICD-10-CM

## 2020-06-13 DIAGNOSIS — I209 Angina pectoris, unspecified: Secondary | ICD-10-CM

## 2020-06-13 DIAGNOSIS — Z79899 Other long term (current) drug therapy: Secondary | ICD-10-CM | POA: Diagnosis not present

## 2020-06-13 DIAGNOSIS — D631 Anemia in chronic kidney disease: Secondary | ICD-10-CM | POA: Diagnosis not present

## 2020-06-13 DIAGNOSIS — Y929 Unspecified place or not applicable: Secondary | ICD-10-CM | POA: Diagnosis not present

## 2020-06-13 DIAGNOSIS — R079 Chest pain, unspecified: Secondary | ICD-10-CM

## 2020-06-13 DIAGNOSIS — E1165 Type 2 diabetes mellitus with hyperglycemia: Secondary | ICD-10-CM

## 2020-06-13 DIAGNOSIS — I10 Essential (primary) hypertension: Secondary | ICD-10-CM | POA: Diagnosis not present

## 2020-06-13 DIAGNOSIS — J440 Chronic obstructive pulmonary disease with acute lower respiratory infection: Secondary | ICD-10-CM | POA: Diagnosis not present

## 2020-06-13 DIAGNOSIS — R0789 Other chest pain: Secondary | ICD-10-CM | POA: Diagnosis not present

## 2020-06-13 DIAGNOSIS — R5383 Other fatigue: Secondary | ICD-10-CM | POA: Diagnosis not present

## 2020-06-13 DIAGNOSIS — N184 Chronic kidney disease, stage 4 (severe): Secondary | ICD-10-CM | POA: Diagnosis not present

## 2020-06-13 DIAGNOSIS — I5032 Chronic diastolic (congestive) heart failure: Secondary | ICD-10-CM | POA: Diagnosis not present

## 2020-06-13 DIAGNOSIS — Z794 Long term (current) use of insulin: Secondary | ICD-10-CM

## 2020-06-13 DIAGNOSIS — I70219 Atherosclerosis of native arteries of extremities with intermittent claudication, unspecified extremity: Secondary | ICD-10-CM

## 2020-06-13 DIAGNOSIS — E039 Hypothyroidism, unspecified: Secondary | ICD-10-CM | POA: Diagnosis present

## 2020-06-13 DIAGNOSIS — E875 Hyperkalemia: Secondary | ICD-10-CM | POA: Diagnosis not present

## 2020-06-13 DIAGNOSIS — I132 Hypertensive heart and chronic kidney disease with heart failure and with stage 5 chronic kidney disease, or end stage renal disease: Secondary | ICD-10-CM | POA: Diagnosis not present

## 2020-06-13 DIAGNOSIS — R0602 Shortness of breath: Secondary | ICD-10-CM | POA: Diagnosis not present

## 2020-06-13 DIAGNOSIS — J9811 Atelectasis: Secondary | ICD-10-CM | POA: Diagnosis not present

## 2020-06-13 DIAGNOSIS — R600 Localized edema: Secondary | ICD-10-CM | POA: Diagnosis not present

## 2020-06-13 DIAGNOSIS — D469 Myelodysplastic syndrome, unspecified: Secondary | ICD-10-CM | POA: Diagnosis not present

## 2020-06-13 DIAGNOSIS — I25110 Atherosclerotic heart disease of native coronary artery with unstable angina pectoris: Secondary | ICD-10-CM | POA: Diagnosis present

## 2020-06-13 DIAGNOSIS — E1122 Type 2 diabetes mellitus with diabetic chronic kidney disease: Secondary | ICD-10-CM | POA: Diagnosis present

## 2020-06-13 DIAGNOSIS — Z6838 Body mass index (BMI) 38.0-38.9, adult: Secondary | ICD-10-CM | POA: Diagnosis not present

## 2020-06-13 DIAGNOSIS — R140 Abdominal distension (gaseous): Secondary | ICD-10-CM | POA: Diagnosis not present

## 2020-06-13 DIAGNOSIS — E1151 Type 2 diabetes mellitus with diabetic peripheral angiopathy without gangrene: Secondary | ICD-10-CM | POA: Diagnosis present

## 2020-06-13 DIAGNOSIS — J209 Acute bronchitis, unspecified: Secondary | ICD-10-CM | POA: Diagnosis present

## 2020-06-13 DIAGNOSIS — I214 Non-ST elevation (NSTEMI) myocardial infarction: Secondary | ICD-10-CM | POA: Diagnosis not present

## 2020-06-13 DIAGNOSIS — I200 Unstable angina: Secondary | ICD-10-CM | POA: Diagnosis not present

## 2020-06-13 DIAGNOSIS — Z6835 Body mass index (BMI) 35.0-35.9, adult: Secondary | ICD-10-CM | POA: Diagnosis not present

## 2020-06-13 DIAGNOSIS — Z20822 Contact with and (suspected) exposure to covid-19: Secondary | ICD-10-CM | POA: Diagnosis not present

## 2020-06-13 LAB — CBG MONITORING, ED
Glucose-Capillary: 88 mg/dL (ref 70–99)
Glucose-Capillary: 198 mg/dL — ABNORMAL HIGH (ref 70–99)
Glucose-Capillary: 303 mg/dL — ABNORMAL HIGH (ref 70–99)

## 2020-06-13 LAB — TROPONIN I (HIGH SENSITIVITY)
Troponin I (High Sensitivity): 39 ng/L — ABNORMAL HIGH (ref <18)
Troponin I (High Sensitivity): 160 ng/L (ref <18)
Troponin I (High Sensitivity): 141 ng/L (ref <18)

## 2020-06-13 LAB — GLUCOSE, CAPILLARY: Glucose-Capillary: 257 mg/dL — ABNORMAL HIGH (ref 70–99)

## 2020-06-13 LAB — CBC
HCT: 33.1 % — ABNORMAL LOW (ref 36.0–46.0)
Hemoglobin: 10.5 g/dL — ABNORMAL LOW (ref 12.0–15.0)
MCH: 29.5 pg (ref 26.0–34.0)
MCHC: 31.7 g/dL (ref 30.0–36.0)
MCV: 93 fL (ref 80.0–100.0)
Platelets: 165 10*3/uL (ref 150–400)
RBC: 3.56 MIL/uL — ABNORMAL LOW (ref 3.87–5.11)
RDW: 14.6 % (ref 11.5–15.5)
WBC: 11 10*3/uL — ABNORMAL HIGH (ref 4.0–10.5)
nRBC: 0 % (ref 0.0–0.2)

## 2020-06-13 LAB — POTASSIUM: Potassium: 4.7 mmol/L (ref 3.5–5.1)

## 2020-06-13 LAB — ECHOCARDIOGRAM COMPLETE
Area-P 1/2: 2.71 cm2
Height: 66 in
S' Lateral: 3.64 cm
Weight: 3488 oz

## 2020-06-13 LAB — LIPID PANEL
Cholesterol: 133 mg/dL (ref 0–200)
HDL: 32 mg/dL — ABNORMAL LOW (ref >40)
LDL Cholesterol: 67 mg/dL (ref 0–99)
Total CHOL/HDL Ratio: 4.2 RATIO
Triglycerides: 169 mg/dL — ABNORMAL HIGH (ref <150)
VLDL: 34 mg/dL (ref 0–40)

## 2020-06-13 LAB — CREATININE, URINE, RANDOM: Creatinine, Urine: 92.57 mg/dL

## 2020-06-13 LAB — NA AND K (SODIUM & POTASSIUM), RAND UR
Potassium Urine: 33 mmol/L
Sodium, Ur: 26 mmol/L

## 2020-06-13 LAB — PROCALCITONIN: Procalcitonin: <0.1 ng/mL

## 2020-06-13 LAB — BASIC METABOLIC PANEL
Anion gap: 11 (ref 5–15)
BUN: 80 mg/dL — ABNORMAL HIGH (ref 8–23)
CO2: 22 mmol/L (ref 22–32)
Calcium: 8.7 mg/dL — ABNORMAL LOW (ref 8.9–10.3)
Chloride: 105 mmol/L (ref 98–111)
Creatinine, Ser: 4.64 mg/dL — ABNORMAL HIGH (ref 0.44–1.00)
GFR calc Af Amer: 11 mL/min — ABNORMAL LOW (ref >60)
GFR calc non Af Amer: 10 mL/min — ABNORMAL LOW (ref >60)
Glucose, Bld: 113 mg/dL — ABNORMAL HIGH (ref 70–99)
Potassium: 4.7 mmol/L (ref 3.5–5.1)
Sodium: 138 mmol/L (ref 135–145)

## 2020-06-13 LAB — OSMOLALITY, URINE: Osmolality, Ur: 410 mOsm/kg (ref 300–900)

## 2020-06-13 LAB — HEMOGLOBIN A1C
Hgb A1c MFr Bld: 6.2 % — ABNORMAL HIGH (ref 4.8–5.6)
Mean Plasma Glucose: 131.24 mg/dL

## 2020-06-13 LAB — HEPARIN LEVEL (UNFRACTIONATED)
Heparin Unfractionated: 0.43 IU/mL (ref 0.30–0.70)
Heparin Unfractionated: 0.22 IU/mL — ABNORMAL LOW (ref 0.30–0.70)

## 2020-06-13 LAB — TSH: TSH: 0.77 u[IU]/mL (ref 0.350–4.500)

## 2020-06-13 MED ORDER — PREDNISONE 20 MG PO TABS
20.0000 mg | ORAL_TABLET | Freq: Every day | ORAL | Status: DC
  Administered 2020-06-13 – 2020-06-14 (×2): 20 mg via ORAL
  Filled 2020-06-13 (×2): qty 1

## 2020-06-13 MED ORDER — SODIUM CHLORIDE 0.9 % IV SOLN: INTRAVENOUS | Status: AC

## 2020-06-13 MED ORDER — HYDRALAZINE HCL 25 MG PO TABS
75.0000 mg | ORAL_TABLET | Freq: Three times a day (TID) | ORAL | Status: DC
  Administered 2020-06-13: 75 mg via ORAL
  Filled 2020-06-13 (×2): qty 3

## 2020-06-13 MED ORDER — PREDNISONE 20 MG PO TABS: 20.0000 mg | ORAL_TABLET | Freq: Two times a day (BID) | ORAL | Status: DC

## 2020-06-13 MED ORDER — ACETAMINOPHEN 325 MG PO TABS: 650.0000 mg | ORAL_TABLET | ORAL | Status: DC | PRN

## 2020-06-13 MED ORDER — CLOPIDOGREL BISULFATE 75 MG PO TABS
75.0000 mg | ORAL_TABLET | Freq: Every day | ORAL | Status: DC
  Administered 2020-06-13 – 2020-06-17 (×5): 75 mg via ORAL
  Filled 2020-06-13 (×5): qty 1

## 2020-06-13 MED ORDER — SODIUM CHLORIDE 0.9 % IV BOLUS
500.0000 mL | Freq: Once | INTRAVENOUS | Status: AC
  Administered 2020-06-13: 500 mL via INTRAVENOUS

## 2020-06-13 MED ORDER — ISOSORBIDE DINITRATE 5 MG PO TABS
5.0000 mg | ORAL_TABLET | Freq: Two times a day (BID) | ORAL | Status: DC
  Administered 2020-06-13: 5 mg via ORAL
  Filled 2020-06-13 (×3): qty 1

## 2020-06-13 MED ORDER — ALBUTEROL SULFATE HFA 108 (90 BASE) MCG/ACT IN AERS: 2.0000 | INHALATION_SPRAY | RESPIRATORY_TRACT | Status: DC | PRN

## 2020-06-13 MED ORDER — ALUM & MAG HYDROXIDE-SIMETH 200-200-20 MG/5ML PO SUSP: 30.0000 mL | Freq: Once | ORAL | Status: DC

## 2020-06-13 MED ORDER — IPRATROPIUM BROMIDE 0.02 % IN SOLN: 2.5000 mL | RESPIRATORY_TRACT | Status: DC | PRN

## 2020-06-13 MED ORDER — SODIUM BICARBONATE 650 MG PO TABS
650.0000 mg | ORAL_TABLET | Freq: Two times a day (BID) | ORAL | Status: DC
  Administered 2020-06-13 – 2020-06-17 (×9): 650 mg via ORAL
  Filled 2020-06-13 (×11): qty 1

## 2020-06-13 MED ORDER — METOPROLOL TARTRATE 25 MG PO TABS
12.5000 mg | ORAL_TABLET | Freq: Two times a day (BID) | ORAL | Status: DC
  Administered 2020-06-13 – 2020-06-17 (×7): 12.5 mg via ORAL
  Filled 2020-06-13 (×9): qty 1

## 2020-06-13 MED ORDER — INSULIN DETEMIR 100 UNIT/ML ~~LOC~~ SOLN
15.0000 [IU] | Freq: Every day | SUBCUTANEOUS | Status: DC
  Administered 2020-06-13 – 2020-06-14 (×3): 15 [IU] via SUBCUTANEOUS
  Filled 2020-06-13 (×4): qty 0.15

## 2020-06-13 MED ORDER — CILOSTAZOL 100 MG PO TABS
100.0000 mg | ORAL_TABLET | Freq: Two times a day (BID) | ORAL | Status: DC
  Administered 2020-06-13 – 2020-06-16 (×7): 100 mg via ORAL
  Filled 2020-06-13 (×10): qty 1

## 2020-06-13 MED ORDER — AMLODIPINE BESYLATE 5 MG PO TABS: 10.0000 mg | ORAL_TABLET | Freq: Every day | ORAL | Status: DC

## 2020-06-13 MED ORDER — IPRATROPIUM-ALBUTEROL 0.5-2.5 (3) MG/3ML IN SOLN
3.0000 mL | Freq: Three times a day (TID) | RESPIRATORY_TRACT | Status: DC
  Administered 2020-06-14 – 2020-06-15 (×4): 3 mL via RESPIRATORY_TRACT
  Filled 2020-06-13 (×4): qty 3

## 2020-06-13 MED ORDER — ALPRAZOLAM 0.25 MG PO TABS
0.2500 mg | ORAL_TABLET | Freq: Two times a day (BID) | ORAL | Status: DC | PRN
  Administered 2020-06-13 – 2020-06-16 (×3): 0.25 mg via ORAL
  Filled 2020-06-13 (×3): qty 1

## 2020-06-13 MED ORDER — INSULIN ASPART 100 UNIT/ML ~~LOC~~ SOLN
0.0000 [IU] | Freq: Every day | SUBCUTANEOUS | Status: DC
  Administered 2020-06-13: 3 [IU] via SUBCUTANEOUS
  Administered 2020-06-14: 4 [IU] via SUBCUTANEOUS

## 2020-06-13 MED ORDER — LEVOTHYROXINE SODIUM 25 MCG PO TABS
125.0000 ug | ORAL_TABLET | Freq: Every day | ORAL | Status: DC
  Administered 2020-06-13 – 2020-06-16 (×4): 125 ug via ORAL
  Filled 2020-06-13: qty 3
  Filled 2020-06-13 (×4): qty 1

## 2020-06-13 MED ORDER — LIDOCAINE VISCOUS HCL 2 % MT SOLN: 15.0000 mL | Freq: Once | OROMUCOSAL | Status: DC

## 2020-06-13 MED ORDER — HEPARIN BOLUS VIA INFUSION
4000.0000 [IU] | Freq: Once | INTRAVENOUS | Status: AC
  Administered 2020-06-13: 4000 [IU] via INTRAVENOUS

## 2020-06-13 MED ORDER — CHLORHEXIDINE GLUCONATE CLOTH 2 % EX PADS
6.0000 | MEDICATED_PAD | Freq: Every day | CUTANEOUS | Status: DC
  Administered 2020-06-13 – 2020-06-15 (×3): 6 via TOPICAL

## 2020-06-13 MED ORDER — HEPARIN (PORCINE) 25000 UT/250ML-% IV SOLN
1250.0000 [IU]/h | INTRAVENOUS | Status: DC
  Administered 2020-06-13: 1000 [IU]/h via INTRAVENOUS
  Administered 2020-06-13 – 2020-06-14 (×2): 1250 [IU]/h via INTRAVENOUS
  Filled 2020-06-13 (×3): qty 250

## 2020-06-13 MED ORDER — INSULIN ASPART 100 UNIT/ML ~~LOC~~ SOLN
0.0000 [IU] | Freq: Three times a day (TID) | SUBCUTANEOUS | Status: DC
  Administered 2020-06-13: 15 [IU] via SUBCUTANEOUS
  Administered 2020-06-13 – 2020-06-14 (×3): 4 [IU] via SUBCUTANEOUS
  Administered 2020-06-14: 11 [IU] via SUBCUTANEOUS
  Administered 2020-06-15: 3 [IU] via SUBCUTANEOUS
  Administered 2020-06-15 (×2): 7 [IU] via SUBCUTANEOUS
  Administered 2020-06-16 (×3): 3 [IU] via SUBCUTANEOUS
  Administered 2020-06-17: 4 [IU] via SUBCUTANEOUS
  Filled 2020-06-13 (×2): qty 1

## 2020-06-13 MED ORDER — ASPIRIN EC 81 MG PO TBEC
81.0000 mg | DELAYED_RELEASE_TABLET | Freq: Every day | ORAL | Status: DC
  Administered 2020-06-13 – 2020-06-17 (×5): 81 mg via ORAL
  Filled 2020-06-13 (×5): qty 1

## 2020-06-13 MED ORDER — FERROUS SULFATE 325 (65 FE) MG PO TABS
325.0000 mg | ORAL_TABLET | Freq: Every day | ORAL | Status: DC
  Administered 2020-06-13 – 2020-06-17 (×5): 325 mg via ORAL
  Filled 2020-06-13 (×5): qty 1

## 2020-06-13 MED ORDER — PREDNISONE 20 MG PO TABS: 20.0000 mg | ORAL_TABLET | Freq: Every day | ORAL | Status: DC

## 2020-06-13 MED ORDER — ONDANSETRON HCL 4 MG/2ML IJ SOLN
4.0000 mg | Freq: Four times a day (QID) | INTRAMUSCULAR | Status: DC | PRN
  Administered 2020-06-13 – 2020-06-16 (×2): 4 mg via INTRAVENOUS
  Filled 2020-06-13 (×4): qty 2

## 2020-06-13 MED ORDER — ATORVASTATIN CALCIUM 40 MG PO TABS
80.0000 mg | ORAL_TABLET | Freq: Every evening | ORAL | Status: DC
  Administered 2020-06-13 – 2020-06-16 (×4): 80 mg via ORAL
  Filled 2020-06-13 (×4): qty 2

## 2020-06-13 MED ORDER — GABAPENTIN 400 MG PO CAPS
400.0000 mg | ORAL_CAPSULE | Freq: Two times a day (BID) | ORAL | Status: DC
  Administered 2020-06-13 – 2020-06-17 (×9): 400 mg via ORAL
  Filled 2020-06-13 (×9): qty 1

## 2020-06-13 MED ORDER — IPRATROPIUM-ALBUTEROL 0.5-2.5 (3) MG/3ML IN SOLN
3.0000 mL | Freq: Four times a day (QID) | RESPIRATORY_TRACT | Status: DC
  Administered 2020-06-13 (×3): 3 mL via RESPIRATORY_TRACT
  Filled 2020-06-13 (×3): qty 3

## 2020-06-13 MED ORDER — BUDESONIDE 0.5 MG/2ML IN SUSP
0.5000 mg | Freq: Two times a day (BID) | RESPIRATORY_TRACT | Status: DC
  Administered 2020-06-13 – 2020-06-17 (×9): 0.5 mg via RESPIRATORY_TRACT
  Filled 2020-06-13 (×9): qty 2

## 2020-06-13 MED ORDER — FAMOTIDINE 20 MG PO TABS
20.0000 mg | ORAL_TABLET | Freq: Two times a day (BID) | ORAL | Status: DC
  Administered 2020-06-13 – 2020-06-14 (×3): 20 mg via ORAL
  Filled 2020-06-13 (×3): qty 1

## 2020-06-13 NOTE — H&P (Signed)
TRH H&P    Patient Demographics:    Donna Howe, is a 64 y.o. female  MRN: [id number]  DOB - 1956-07-24  Admit Date - 06/12/2020  Referring MD/NP/PA: Alvino Chapel  Outpatient Primary MD for the patient is [facility]  Patient coming from: Home  Chief complaint- Chest pain   HPI:    Donna Howe  is a 64 y.o. female, with history of type 2 diabetes mellitus, thrombocytopenia, small bowel obstruction, CABG, peripheral artery disease, myocardial infarction, hypothyroidism, hyperlipidemia, essential hypertension, renal insufficiency, CAD, and more presents to the ED with a chief complaint of chest pain.  Patient reports that her chest pain is located in the center of her chest, the left side, even laterally to her breast, radiating down her left arm, and through to her back.  Patient reports the pain started 2 days ago.  It was initially improving with nitro over the past couple of days.  Today when she took her first nitro it helped the chest pain.  But when she took a second nitro it did not help.  She took these 2 doses 10 minutes apart.  Patient reports that her chest pain is worse with exertion especially ambulating.  Since the nitro drip has started patient reports that the pain has improved from a 7 out of 10 to a 5 out of 10.  Patient describes the pain as a squeezing pressure.  She says occasionally it throbs.  Patient has associated dyspnea, that she thought was due to bronchitis.  Patient has associated diaphoresis, and dizziness as well.  She denies palpitations or nausea.  Patient reports that this feels similar to when she had her CABG 16 years ago.  Patient reports her last stress test was about 2 years ago.  She reports that she has had 4-5 stents placed and the last one was 2 to 3 years ago.  Her last echo was on her last admission in January 2021.  Patient is on aspirin, statin, Plavix at  home.  Patient reports that she has stage V kidney disease.  She has had decreased urine output, and only voided 3 times total yesterday.  Patient reports that she is getting ready to have a fistula placed for dialysis.  On review of system patient agrees to congestion in her chest, a dry cough, but no fevers.  She reports that she is seen an outside provider for this and was prescribed Levaquin, Atrovent inhaler, prednisone, and Tessalon Perles.  She reports that she has been taking Levaquin for 3 days.  Patient reports that she has had weight gain of approximately 20 pounds.  She feels like her abdomen is bloated.  Patient has no other complaints at this time.  In the ED Temperature 97.9, heart rate 86, respiratory rate 15, blood pressure 150/74, satting 94% Leukocytosis at 12.4, hemoglobin 11.5 Hyperkalemia at 5.3 BUN to creatinine ratio is 75:4 0.77 Glucose is 175 Chest x-ray shows cardiac enlargement no acute pulmonary disease EKG shows a heart rate of 85, sinus rhythm, QTC of 474, repolarization abnormality  Heparin and nitro drip started in the ED Cardiology consulted and advised that patient can be admitted here any pen.    Review of systems:    In addition to the HPI above,  No Fever-chills, No Headache, No changes with Vision or hearing, No problems swallowing food or Liquids, Positive for chest pain and dyspnea No Abdominal pain, No Nausea or Vomiting, bowel movements are regular, No Blood in stool or Urine, No dysuria, but admits to decreased UOP No new skin rashes or bruises, No new joints pains-aches,  No new weakness, tingling, numbness in any extremity, Admits to weight gain but no weight loss, No polyuria, polydypsia or polyphagia, No significant Mental Stressors.  All other systems reviewed and are negative.    Past History of the following :    Past Medical History:  Diagnosis Date  . Anemia   . Asthma   . CAD (coronary artery disease)    Multivessel s/p  CABG 2005, numerous PCIs since that time  . Carotid artery disease (HCC)    R CEA  . Chronic kidney disease (CKD), stage III (moderate)   . Essential hypertension   . Gout   . Hyperlipidemia   . Hypothyroidism   . Myocardial infarction ([place])   . PAD (peripheral artery disease) ([place])    Dr. Kellie Simmering  . S/P angioplasty with stent- DES to [facility] and to [facility] to LAD with DES 04/09/18.   04/10/2018  . SBO (small bowel obstruction) ([place]) 2011   Status post lysis of adhesions & hernia repair  . Sinus bradycardia   . Thrombocytopenia, unspecified ([place])   . Type 2 diabetes mellitus ([place])   . Umbilical hernia       Past Surgical History:  Procedure Laterality Date  . CESAREAN SECTION  1984  . CHOLECYSTECTOMY  2010  . CORONARY ARTERY BYPASS GRAFT  2005  . CORONARY BALLOON ANGIOPLASTY N/A 05/31/2017   Procedure: CORONARY BALLOON ANGIOPLASTY;  Surgeon: Jettie Booze, MD;  Location: [place] [facility];  Service: Cardiovascular;  Laterality: N/A;  . CORONARY STENT INTERVENTION N/A 05/31/2017   Procedure: CORONARY STENT INTERVENTION;  Surgeon: Jettie Booze, MD;  Location: [place] [facility];  Service: Cardiovascular;  Laterality: N/A;  . CORONARY STENT INTERVENTION N/A 04/09/2018   Procedure: CORONARY STENT INTERVENTION;  Surgeon: Jettie Booze, MD;  Location: [place] [facility];  Service: Cardiovascular;  Laterality: N/A;  SVG RCA  . ENDARTERECTOMY Right 04/18/2013   Procedure: ENDARTERECTOMY CAROTID;  Surgeon: Mal Misty, MD;  Location: [place];  Service: Vascular;  Laterality: Right;  . HERNIA REPAIR  1989  . Incisional hernia repair x2  03/04/2010   Laparoscopic with 35cm mesh by Dr Ronnald Collum  . LEFT HEART CATH AND CORS/GRAFTS ANGIOGRAPHY N/A 05/31/2017   Procedure: LEFT HEART CATH AND CORS/GRAFTS ANGIOGRAPHY;  Surgeon: Jettie Booze, MD;  Location: [place] [facility];  Service: Cardiovascular;  Laterality: N/A;  . LEFT HEART CATH AND CORS/GRAFTS ANGIOGRAPHY N/A 04/08/2018    Procedure: LEFT HEART CATH AND CORS/GRAFTS ANGIOGRAPHY;  Surgeon: Jettie Booze, MD;  Location: [place] [facility];  Service: Cardiovascular;  Laterality: N/A;  . LEFT HEART CATHETERIZATION WITH CORONARY ANGIOGRAM N/A 12/19/2012   Procedure: LEFT HEART CATHETERIZATION WITH CORONARY ANGIOGRAM;  Surgeon: Josue Hector, MD;  Location: [facility] CATH LAB;  Service: Cardiovascular;  Laterality: N/A;  . LEFT HEART CATHETERIZATION WITH CORONARY/GRAFT ANGIOGRAM N/A 04/19/2013   Procedure: LEFT HEART CATHETERIZATION WITH Beatrix Fetters;  Surgeon: Pearletha Forge  Gwenlyn Found, MD;  Location: [facility] CATH LAB;  Service: Cardiovascular;  Laterality: N/A;  . PATCH ANGIOPLASTY Right 04/18/2013   Procedure: PATCH ANGIOPLASTY Right Internal Carotid Artery;  Surgeon: Mal Misty, MD;  Location: [place];  Service: Vascular;  Laterality: Right;  . PERCUTANEOUS CORONARY STENT INTERVENTION (PCI-S) Right 12/19/2012   Procedure: PERCUTANEOUS CORONARY STENT INTERVENTION (PCI-S);  Surgeon: Josue Hector, MD;  Location: [facility] CATH LAB;  Service: Cardiovascular;  Laterality: Right;  . SHOULDER SURGERY        Social History:      Social History   Tobacco Use  . Smoking status: Former Smoker    Packs/day: 1.00    Years: 20.00    Pack years: 20.00    Types: Cigarettes    Quit date: 12/10/2012    Years since quitting: 7.5  . Smokeless tobacco: Never Used  Substance Use Topics  . Alcohol use: No    Alcohol/week: 0.0 standard drinks       Family History :     Family History  Problem Relation Age of Onset  . Diabetes Mother   . Heart disease Mother        before age 67  . Hyperlipidemia Mother   . Hypertension Mother   . Thyroid disease Father   . Hypertension Father   . AAA (abdominal aortic aneurysm) Father   . Heart disease Brother        before age 70  . Hypertension Brother   . Hyperlipidemia Son   . Hypertension Son       Home Medications:   Prior to Admission medications   Medication Sig Start Date  End Date Taking? Authorizing Provider  ALPRAZolam Duanne Moron) 0.5 MG tablet Take 0.25-0.5 mg by mouth See admin instructions. Take 0.25 mg tab by mouth every morning & evening as needed  and .05 mg tab at bedtime   Yes [provider]  amLODipine (NORVASC) 10 MG tablet Take 10 mg by mouth daily.    Yes [provider]  aspirin EC 81 MG tablet Take 81 mg by mouth daily.    Yes [provider]  atorvastatin (LIPITOR) 80 MG tablet Take 1 tablet (80 mg total) by mouth every evening. 12/19/19  Yes Johnson, Clanford L, MD  benzonatate (TESSALON) 100 MG capsule Take 100 mg by mouth 3 (three) times daily as needed for cough.   Yes [provider]  clopidogrel (PLAVIX) 75 MG tablet Take 1 tablet (75 mg total) by mouth daily. 08/27/18  Yes Satira Sark, MD  colchicine 0.6 MG tablet Take 0.6 mg by mouth daily.    Yes [provider]  famotidine (PEPCID) 20 MG tablet One after bfast and supper Patient taking differently: Take 20 mg by mouth 2 (two) times daily. One after bfast and supper 11/20/19  Yes Tanda Rockers, MD  ferrous sulfate 325 (65 FE) MG tablet Take 325 mg by mouth daily with breakfast.   Yes [provider]  gabapentin (NEURONTIN) 400 MG capsule Take 1 capsule (400 mg total) by mouth 4 (four) times daily. Patient taking differently: Take 400 mg by mouth 2 (two) times daily.  11/20/19  Yes Tanda Rockers, MD  hydrALAZINE (APRESOLINE) 50 MG tablet Take 75 mg by mouth in the morning and at bedtime.  06/10/19  Yes [provider]  ipratropium (ATROVENT HFA) 17 MCG/ACT inhaler Inhale 2 puffs into the lungs See admin instructions. Inhale 2 puffs every 4 to 6 hrs as needed.  Yes [provider]  isosorbide dinitrate (ISORDIL) 5 MG tablet Take 5 mg by mouth 2 (two) times daily.   Yes [provider]  levofloxacin (LEVAQUIN) 750 MG tablet Take 750 mg by mouth daily.   Yes [provider]  levothyroxine (SYNTHROID,  LEVOTHROID) 125 MCG tablet Take 125 mcg by mouth daily.    Yes [provider]  nitroGLYCERIN (NITROSTAT) 0.4 MG SL tablet Place 0.4 mg under the tongue every 5 (five) minutes as needed for chest pain. Not to exceed 3 in 15 minute time frame   Yes [provider]  NOVOLIN 70/30 RELION (70-30) 100 UNIT/ML injection Inject 29 Units into the skin 2 (two) times daily with a meal. Am & PM 08/28/15  Yes [provider]  ondansetron (ZOFRAN ODT) 8 MG disintegrating tablet Take 1 tablet (8 mg total) by mouth every 8 (eight) hours as needed for nausea or vomiting. 08/14/15  Yes Idol, Almyra Free, PA-C  predniSONE (DELTASONE) 20 MG tablet Take 20 mg by mouth 2 (two) times daily.   Yes [provider]  sodium bicarbonate 650 MG tablet Take 650 mg by mouth 2 (two) times daily.   Yes [provider]  Albuterol Sulfate 108 (90 Base) MCG/ACT AEPB Inhale 2 puffs into the lungs every 4 (four) hours as needed (Shortness of breath).  Patient not taking: Reported on 06/12/2020    [provider]  cilostazol (PLETAL) 100 MG tablet Take 1 tablet (100 mg total) by mouth 2 (two) times daily before a meal. Patient not taking: Reported on 06/12/2020 12/16/19   Marty Heck, MD  Vitamin D, Ergocalciferol, (DRISDOL) 1.25 MG (50000 UNIT) CAPS capsule Take 50,000 Units by mouth once a week. 12/31/19   [provider]     Allergies:     Allergies  Allergen Reactions  . Penicillins Other (See Comments)    REACTION: Unknown, told as a child Has patient had a PCN reaction causing immediate rash, facial/tongue/throat swelling, SOB or lightheadedness with hypotension: Unknown Has patient had a PCN reaction causing severe rash involving mucus membranes or skin necrosis: Unknown Has patient had a PCN reaction that required hospitalization: Unknown Has patient had a PCN reaction occurring within the last 10 years: No If all of the above answers are "NO", then may proceed  with Cephalosporin use.      Physical Exam:   Vitals  Blood pressure (!) 139/57, pulse 81, temperature 97.9 F (36.6 C), resp. rate 18, height 5\' 6"  (1.676 m), weight 98.9 kg, SpO2 97 %.  1.  General: Laying supine in bed in no acute distress  2. Psychiatric: Mood and behavior are normal for situtation Alert and oriented x 3  3. Neurologic: CNII-XII intact No focal deficit on limited exam At baseline  4. HEENMT:  Head is atraumatic, normocephalic Sclera clear Neck is supple Trachea midline Mucous membranes moist  5. Respiratory : LCTABL No clubbing or cyanosis  6. Cardiovascular : HR normal Rhythm Regular Peripheral pulses palpated Systolic murmur present  7. Gastrointestinal:  Abdomen is soft, non distended, non tender to palptation  8. Skin:  No acute lesions on limited skin exam  9.Musculoskeletal:  No acute deformity or peripheral edema    Data Review:    CBC Recent Labs  Lab 06/12/20 2118  WBC 12.4*  HGB 11.5*  HCT 35.6*  PLT 195  MCV 93.2  MCH 30.1  MCHC 32.3  RDW 14.9   ------------------------------------------------------------------------------------------------------------------  Results for orders placed or performed during  the hospital encounter of 06/12/20 (from the past 48 hour(s))  Basic metabolic panel     Status: Abnormal   Collection Time: 06/12/20  9:18 PM  Result Value Ref Range   Sodium 137 135 - 145 mmol/L   Potassium 5.3 (H) 3.5 - 5.1 mmol/L   Chloride 102 98 - 111 mmol/L   CO2 21 (L) 22 - 32 mmol/L   Glucose, Bld 175 (H) 70 - 99 mg/dL    Comment: Glucose reference range applies only to samples taken after fasting for at least 8 hours.   BUN 75 (H) 8 - 23 mg/dL   Creatinine, Ser 4.77 (H) 0.44 - 1.00 mg/dL   Calcium 9.0 8.9 - 10.3 mg/dL   GFR calc non Af Amer 9 (L) >60 mL/min   GFR calc Af Amer 11 (L) >60 mL/min   Anion gap 14 5 - 15    Comment: Performed at [facility], [address], [place], [place]  [address]  CBC     Status: Abnormal   Collection Time: 06/12/20  9:18 PM  Result Value Ref Range   WBC 12.4 (H) 4.0 - 10.5 K/uL   RBC 3.82 (L) 3.87 - 5.11 MIL/uL   Hemoglobin 11.5 (L) 12.0 - 15.0 g/dL   HCT 35.6 (L) 36 - 46 %   MCV 93.2 80.0 - 100.0 fL   MCH 30.1 26.0 - 34.0 pg   MCHC 32.3 30.0 - 36.0 g/dL   RDW 14.9 11.5 - 15.5 %   Platelets 195 150 - 400 K/uL   nRBC 0.2 0.0 - 0.2 %    Comment: Performed at [facility], [address] [place], [place] [address]  Troponin I (High Sensitivity)     Status: Abnormal   Collection Time: 06/12/20  9:18 PM  Result Value Ref Range   Troponin I (High Sensitivity) 20 (H) <18 ng/L    Comment: (NOTE) Elevated high sensitivity troponin I (hsTnI) values and significant  changes across serial measurements may suggest ACS but many other  chronic and acute conditions are known to elevate hsTnI results.  Refer to the "Links" section for chest pain algorithms and additional  guidance. Performed at [facility], [address] [place], [place] [address]   Respiratory Panel by RT PCR (Flu A&B, Covid) - Nasopharyngeal Swab     Status: None   Collection Time: 06/12/20 10:57 PM   Specimen: Nasopharyngeal Swab  Result Value Ref Range   SARS Coronavirus 2 by RT PCR NEGATIVE NEGATIVE    Comment: (NOTE) SARS-CoV-2 target nucleic acids are NOT DETECTED.  The SARS-CoV-2 RNA is generally detectable in upper respiratoy specimens during the acute phase of infection. The lowest concentration of SARS-CoV-2 viral copies this assay can detect is 131 copies/mL. A negative result does not preclude SARS-Cov-2 infection and should not be used as the sole basis for treatment or other patient management decisions. A negative result may occur with  improper specimen collection/handling, submission of specimen other than nasopharyngeal swab, presence of viral mutation(s) within the areas targeted by this assay, and inadequate number of viral copies (<131 copies/mL). A  negative result must be combined with clinical observations, patient history, and epidemiological information. The expected result is Negative.  Fact Sheet for Patients:  PinkCheek.be  Fact Sheet for Healthcare Providers:  GravelBags.it  This test is no t yet approved or cleared by the [place] FDA and  has been authorized for detection and/or diagnosis of SARS-CoV-2 by FDA under an Emergency Use Authorization (  EUA). This EUA will remain  in effect (meaning this test can be used) for the duration of the COVID-19 declaration under Section 564(b)(1) of the Act, 21 U.S.C. section 360bbb-3(b)(1), unless the authorization is terminated or revoked sooner.     Influenza A by PCR NEGATIVE NEGATIVE   Influenza B by PCR NEGATIVE NEGATIVE    Comment: (NOTE) The Xpert Xpress SARS-CoV-2/FLU/RSV assay is intended as an aid in  the diagnosis of influenza from Nasopharyngeal swab specimens and  should not be used as a sole basis for treatment. Nasal washings and  aspirates are unacceptable for Xpert Xpress SARS-CoV-2/FLU/RSV  testing.  Fact Sheet for Patients: PinkCheek.be  Fact Sheet for Healthcare Providers: GravelBags.it  This test is not yet approved or cleared by the [place] FDA and  has been authorized for detection and/or diagnosis of SARS-CoV-2 by  FDA under an Emergency Use Authorization (EUA). This EUA will remain  in effect (meaning this test can be used) for the duration of the  Covid-19 declaration under Section 564(b)(1) of the Act, 21  U.S.C. section 360bbb-3(b)(1), unless the authorization is  terminated or revoked. Performed at [facility], [address] [place], [place] [address]     Chemistries  Recent Labs  Lab 06/12/20 2118  NA 137  K 5.3*  CL 102  CO2 21*  GLUCOSE 175*  BUN 75*  CREATININE 4.77*  CALCIUM 9.0    ------------------------------------------------------------------------------------------------------------------  ------------------------------------------------------------------------------------------------------------------ GFR: Estimated Creatinine Clearance: 14.7 mL/min (A) (by C-G formula based on SCr of 4.77 mg/dL (H)). Liver Function Tests: No results for input(s): AST, ALT, ALKPHOS, BILITOT, PROT, ALBUMIN in the last 168 hours. No results for input(s): LIPASE, AMYLASE in the last 168 hours. No results for input(s): AMMONIA in the last 168 hours. Coagulation Profile: No results for input(s): INR, PROTIME in the last 168 hours. Cardiac Enzymes: No results for input(s): CKTOTAL, CKMB, CKMBINDEX, TROPONINI in the last 168 hours. BNP (last 3 results) No results for input(s): PROBNP in the last 8760 hours. HbA1C: No results for input(s): HGBA1C in the last 72 hours. CBG: No results for input(s): GLUCAP in the last 168 hours. Lipid Profile: No results for input(s): CHOL, HDL, LDLCALC, TRIG, CHOLHDL, LDLDIRECT in the last 72 hours. Thyroid Function Tests: No results for input(s): TSH, T4TOTAL, FREET4, T3FREE, THYROIDAB in the last 72 hours. Anemia Panel: No results for input(s): VITAMINB12, FOLATE, FERRITIN, TIBC, IRON, RETICCTPCT in the last 72 hours.  --------------------------------------------------------------------------------------------------------------- Urine analysis:    Component Value Date/Time   COLORURINE YELLOW 12/18/2019 1353   APPEARANCEUR HAZY (A) 12/18/2019 1353   LABSPEC 1.016 12/18/2019 1353   PHURINE 5.0 12/18/2019 1353   GLUCOSEU 50 (A) 12/18/2019 1353   HGBUR NEGATIVE 12/18/2019 1353   BILIRUBINUR NEGATIVE 12/18/2019 1353   KETONESUR NEGATIVE 12/18/2019 1353   PROTEINUR >=300 (A) 12/18/2019 1353   UROBILINOGEN 0.2 10/26/2013 2229   NITRITE NEGATIVE 12/18/2019 1353   LEUKOCYTESUR NEGATIVE 12/18/2019 1353      Imaging Results:    DG Chest  2 View  Result Date: 06/12/2020 CLINICAL DATA:  Chest pain and fatigue throughout the day. EXAM: CHEST - 2 VIEW COMPARISON:  06/11/2020 FINDINGS: Cardiac enlargement. No vascular congestion, edema, or consolidation. No pleural effusions. No pneumothorax. Mediastinal contours appear intact. Postoperative changes in the mediastinum. No significant changes since prior study. IMPRESSION: Cardiac enlargement.  No active pulmonary disease. Electronically Signed   By: Lucienne Capers M.D.   On: 06/12/2020 21:40    My personal review of EKG: Rhythm  NSR, Rate 85 /min, QTc 474 ,with repolarization abnormality   Assessment & Plan:    Active Problems:   Chest pain   1. Chest pain 1. History consistent with cardiac etiology 2. Heart score of 7 3. EKG with repolarization abnormality 4. Nitro drip started in the ED continue 5. Heparin drip started in the ED-continue 6. Continue home aspirin, Plavix 7. Patient is likely not on an ACE or ARB due to renal insufficiency 8. We will need to further explore why she is not on a beta-blocker  9. Continue atorvastatin 10. Lipid panel in the a.m. 11. Monitor on telemetry 12. Admit to stepdown 13. Echo in the a.m. 14. ER doctor spoke with cardiologist who recommended admission here any pen as patient is not a cath candidate with her AKI, and she would not get the echo any sooner at [facility] per ED provider report 2. AKI 1. Baseline creatinine is around 3.22 2. Patient reports that she is currently undergoing evaluation for initiation of dialysis 3. Today creatinine is 4.77 4. Continue gentle hydration 5. Trend in the a.m. 3. Hyperkalemia 1. Potassium is 5.3 2. Likely secondary to renal insufficiency 3. Repeat potassium 4. Continue fluids 5. If potassium increases above 5.5 start hyperkalemia protocol 6. Continue to monitor 4. Leukocytosis 1. Likely secondary to steroid use 2. Continue to monitor 5. Diabetes mellitus type 2 1. Patient takes 29  units of 70/30 2. Continue 15 units nightly here 3. Sliding scale coverage 4. Heart healthy carb modified diet 6.    DVT Prophylaxis-   Heparin- SCDs   AM Labs Ordered, also please review Full Orders  Family Communication: No family at bedside-patient reports that son would be her decision maker if needed. Code Status: Full  Admission status: Observation.  The patient's presenting symptoms include chest pain with history that is consistent with cardiac etiology The initial radiographic and laboratory data are worrisome because of EKG shows repolarization abnormality that could be suggestive of ischemia The chronic co-morbidities include history of CAD, type 2 diabetes mellitus, peripheral artery disease, myocardial infarction, hypothyroidism, hyperlipidemia, renal insufficiency       * I certify that at the point of admission it is my clinical judgment that the patient will require inpatient hospital care spanning beyond 2 midnights from the point of admission due to high intensity of service, high risk for further deterioration and high frequency of surveillance required.*  Time spent in minutes : [place]

## 2020-06-13 NOTE — ED Notes (Signed)
Pt ambulated to [facility] reports left sided chest pain upon return to room . Increased nitro 74mcg to 25 mcg. Will cont to monitor

## 2020-06-13 NOTE — Care Management Obs Status (Signed)
[facility] NOTIFICATION   Patient Details  Name: AMAR KEENUM MRN: [id number] Date of Birth: 1955/10/12   Medicare Observation Status Notification Given:  Yes    Sherie Don, LCSW 06/13/2020, 12:23 PM

## 2020-06-13 NOTE — ED Notes (Signed)
CRITICAL VALUE ALERT  Critical Value: troponin 160  Date & Time Notied: 1601  Provider Notified: Tat  Orders Received/Actions taken:

## 2020-06-13 NOTE — Progress Notes (Signed)
ANTICOAGULATION CONSULT NOTE - Initial Consult  Pharmacy Consult for Heparin Indication: chest pain/ACS  Allergies  Allergen Reactions  . Penicillins Other (See Comments)    REACTION: Unknown, told as a child Has patient had a PCN reaction causing immediate rash, facial/tongue/throat swelling, SOB or lightheadedness with hypotension: Unknown Has patient had a PCN reaction causing severe rash involving mucus membranes or skin necrosis: Unknown Has patient had a PCN reaction that required hospitalization: Unknown Has patient had a PCN reaction occurring within the last 10 years: No If all of the above answers are "NO", then may proceed with Cephalosporin use.     Patient Measurements: Height: 5\' 6"  (167.6 cm) Weight: 98.9 kg (218 lb) IBW/kg (Calculated) : 59.3 Heparin Dosing Weight: 82kg   Vital Signs: Temp: 97.9 F (36.6 C) (09/25 2107) BP: 139/57 (09/26 0030) Pulse Rate: 81 (09/26 0030)  Labs: Recent Labs    06/12/20 2118  HGB 11.5*  HCT 35.6*  PLT 195  CREATININE 4.77*  TROPONINIHS 20*    Estimated Creatinine Clearance: 14.7 mL/min (A) (by C-G formula based on SCr of 4.77 mg/dL (H)).   Medical History: Past Medical History:  Diagnosis Date  . Anemia   . Asthma   . CAD (coronary artery disease)    Multivessel s/p CABG 2005, numerous PCIs since that time  . Carotid artery disease (HCC)    R CEA  . Chronic kidney disease (CKD), stage III (moderate)   . Essential hypertension   . Gout   . Hyperlipidemia   . Hypothyroidism   . Myocardial infarction ([place])   . PAD (peripheral artery disease) ([place])    Dr. Kellie Simmering  . S/P angioplasty with stent- DES to [facility] and to LIMA to LAD with DES 04/09/18.   04/10/2018  . SBO (small bowel obstruction) ([place]) 2011   Status post lysis of adhesions & hernia repair  . Sinus bradycardia   . Thrombocytopenia, unspecified ([place])   . Type 2 diabetes mellitus ([place])   . Umbilical hernia      Assessment: 64 yo W with chest pain,  hsTrop 20. Pharmacy consulted for heparin.   Goal of Therapy:  Heparin level 0.3-0.7 units/ml Monitor platelets by anticoagulation protocol: Yes   Plan:  Heparin 4000 unit bolus then 1000 units/hr F/u 6hr HL  Monitor daily HL, CBC/plt Monitor for signs/symptoms of bleeding    Benetta Spar, PharmD, BCPS, BCCP Clinical Pharmacist  Please check AMION for all [place] phone numbers After 10:00 PM, call [place]

## 2020-06-13 NOTE — ED Notes (Signed)
Pt was given a lunch tray

## 2020-06-13 NOTE — Progress Notes (Signed)
ANTICOAGULATION CONSULT NOTE - Initial Consult  Pharmacy Consult for Heparin Indication: chest pain/ACS  Allergies  Allergen Reactions  . Penicillins Other (See Comments)    REACTION: Unknown, told as a child Has patient had a PCN reaction causing immediate rash, facial/tongue/throat swelling, SOB or lightheadedness with hypotension: Unknown Has patient had a PCN reaction causing severe rash involving mucus membranes or skin necrosis: Unknown Has patient had a PCN reaction that required hospitalization: Unknown Has patient had a PCN reaction occurring within the last 10 years: No If all of the above answers are "NO", then may proceed with Cephalosporin use.     Patient Measurements: Height: 5\' 6"  (167.6 cm) Weight: 101.9 kg (224 lb 10.4 oz) IBW/kg (Calculated) : 59.3 Heparin Dosing Weight: 82kg   Vital Signs: Temp: 97.9 F (36.6 C) (09/26 1847) Temp Source: Oral (09/26 1847) BP: 125/34 (09/27 0000) Pulse Rate: 66 (09/27 0000)  Labs: Recent Labs    06/12/20 2118 06/12/20 2118 06/13/20 0023 06/13/20 0601 06/13/20 1441 06/13/20 1452 06/13/20 1642 06/13/20 2332  HGB 11.5*  --   --  10.5*  --   --   --   --   HCT 35.6*  --   --  33.1*  --   --   --   --   PLT 195  --   --  165  --   --   --   --   HEPARINUNFRC  --   --   --  0.43 0.22*  --   --  0.37  CREATININE 4.77*  --   --  4.64*  --   --   --   --   TROPONINIHS 20*   < > 39*  --   --  160* 141*  --    < > = values in this interval not displayed.    Estimated Creatinine Clearance: 15.3 mL/min (A) (by C-G formula based on SCr of 4.64 mg/dL (H)).   Assessment: 64 yo W with chest pain. No a candidate for cath, continue with medical management. Pharmacy consulted for heparin.   HL 0.37,therapeutic. H/H, plt stable.    Goal of Therapy:  Heparin level 0.3-0.7 units/ml Monitor platelets by anticoagulation protocol: Yes   Plan:  Continue heparin to 1250units/hr  Monitor daily HL, CBC/plt Monitor for  signs/symptoms of bleeding   Benetta Spar, PharmD, BCPS, BCCP Clinical Pharmacist  Please check AMION for all [place] phone numbers After 10:00 PM, call [place]

## 2020-06-13 NOTE — ED Notes (Signed)
Metoprolol 12.5 mg adm

## 2020-06-13 NOTE — ED Notes (Signed)
Pt currently denies chest. Bp noted 79/54  Hospitalist  notifed

## 2020-06-13 NOTE — ED Notes (Signed)
Pt was sleeping and woke up with chest pain.. EKG captured. Notified Dr Tat.  Will order troponin

## 2020-06-13 NOTE — ED Notes (Addendum)
Pt given sandwich and ice water per request.

## 2020-06-13 NOTE — Progress Notes (Signed)
ANTICOAGULATION CONSULT NOTE - Initial Consult  Pharmacy Consult for Heparin Indication: chest pain/ACS  Allergies  Allergen Reactions  . Penicillins Other (See Comments)    REACTION: Unknown, told as a child Has patient had a PCN reaction causing immediate rash, facial/tongue/throat swelling, SOB or lightheadedness with hypotension: Unknown Has patient had a PCN reaction causing severe rash involving mucus membranes or skin necrosis: Unknown Has patient had a PCN reaction that required hospitalization: Unknown Has patient had a PCN reaction occurring within the last 10 years: No If all of the above answers are "NO", then may proceed with Cephalosporin use.     Patient Measurements: Height: 5\' 6"  (167.6 cm) Weight: 98.9 kg (218 lb) IBW/kg (Calculated) : 59.3 Heparin Dosing Weight: 82kg   Vital Signs: BP: 154/56 (09/26 1500) Pulse Rate: 77 (09/26 1500)  Labs: Recent Labs    06/12/20 2118 06/13/20 0023 06/13/20 0601 06/13/20 1441 06/13/20 1452  HGB 11.5*  --  10.5*  --   --   HCT 35.6*  --  33.1*  --   --   PLT 195  --  165  --   --   HEPARINUNFRC  --   --  0.43 0.22*  --   CREATININE 4.77*  --  4.64*  --   --   TROPONINIHS 20* 39*  --   --  160*    Estimated Creatinine Clearance: 15.1 mL/min (A) (by C-G formula based on SCr of 4.64 mg/dL (H)).   Medical History: Past Medical History:  Diagnosis Date  . Anemia   . Asthma   . CAD (coronary artery disease)    Multivessel s/p CABG 2005, numerous PCIs since that time  . Carotid artery disease (HCC)    R CEA  . Chronic kidney disease (CKD), stage III (moderate)   . Essential hypertension   . Gout   . Hyperlipidemia   . Hypothyroidism   . Myocardial infarction ([place])   . PAD (peripheral artery disease) ([place])    Dr. Kellie Simmering  . S/P angioplasty with stent- DES to [facility] and to LIMA to LAD with DES 04/09/18.   04/10/2018  . SBO (small bowel obstruction) ([place]) 2011   Status post lysis of adhesions & hernia repair  .  Sinus bradycardia   . Thrombocytopenia, unspecified ([place])   . Type 2 diabetes mellitus ([place])   . Umbilical hernia      Assessment: 64 yo W with chest pain, hsTrop 20. Pharmacy consulted for heparin.  HL 0.22, subtherapeutic   Goal of Therapy:  Heparin level 0.3-0.7 units/ml Monitor platelets by anticoagulation protocol: Yes   Plan:  Increase heparin to 1250units/hr  F/u 6hr HL  Monitor daily HL, CBC/plt Monitor for signs/symptoms of bleeding    Thomasenia Sales, PharmD, MBA, BCGP Clinical Pharmacist   Please check AMION for all [place] phone numbers After 10:00 PM, call [place] [phone]

## 2020-06-13 NOTE — ED Notes (Signed)
Pt ambulated to BR

## 2020-06-13 NOTE — Progress Notes (Signed)
  Echocardiogram 2D Echocardiogram has been performed.  Donna Howe 06/13/2020, 9:11 AM

## 2020-06-13 NOTE — Progress Notes (Signed)
ANTICOAGULATION CONSULT NOTE - Initial Consult  Pharmacy Consult for Heparin Indication: chest pain/ACS  Allergies  Allergen Reactions  . Penicillins Other (See Comments)    REACTION: Unknown, told as a child Has patient had a PCN reaction causing immediate rash, facial/tongue/throat swelling, SOB or lightheadedness with hypotension: Unknown Has patient had a PCN reaction causing severe rash involving mucus membranes or skin necrosis: Unknown Has patient had a PCN reaction that required hospitalization: Unknown Has patient had a PCN reaction occurring within the last 10 years: No If all of the above answers are "NO", then may proceed with Cephalosporin use.     Patient Measurements: Height: 5\' 6"  (167.6 cm) Weight: 98.9 kg (218 lb) IBW/kg (Calculated) : 59.3 Heparin Dosing Weight: 82kg   Vital Signs: BP: 136/61 (09/26 0815) Pulse Rate: 65 (09/26 0815)  Labs: Recent Labs    06/12/20 2118 06/13/20 0023 06/13/20 0601  HGB 11.5*  --  10.5*  HCT 35.6*  --  33.1*  PLT 195  --  165  HEPARINUNFRC  --   --  0.43  CREATININE 4.77*  --  4.64*  TROPONINIHS 20* 39*  --     Estimated Creatinine Clearance: 15.1 mL/min (A) (by C-G formula based on SCr of 4.64 mg/dL (H)).   Medical History: Past Medical History:  Diagnosis Date  . Anemia   . Asthma   . CAD (coronary artery disease)    Multivessel s/p CABG 2005, numerous PCIs since that time  . Carotid artery disease (HCC)    R CEA  . Chronic kidney disease (CKD), stage III (moderate)   . Essential hypertension   . Gout   . Hyperlipidemia   . Hypothyroidism   . Myocardial infarction ([place])   . PAD (peripheral artery disease) ([place])    Dr. Kellie Simmering  . S/P angioplasty with stent- DES to [facility] and to LIMA to LAD with DES 04/09/18.   04/10/2018  . SBO (small bowel obstruction) ([place]) 2011   Status post lysis of adhesions & hernia repair  . Sinus bradycardia   . Thrombocytopenia, unspecified ([place])   . Type 2 diabetes mellitus ([place])    . Umbilical hernia      Assessment: 64 yo W with chest pain, hsTrop 20. Pharmacy consulted for heparin.  HL 0.43, therapeutic   Goal of Therapy:  Heparin level 0.3-0.7 units/ml Monitor platelets by anticoagulation protocol: Yes   Plan:  Heparin iv 1000 units/hr F/u 6hr HL  Monitor daily HL, CBC/plt Monitor for signs/symptoms of bleeding    Donna Christen Shontay Wallner, PharmD, MBA, BCGP Clinical Pharmacist   Please check AMION for all [place] phone numbers After 10:00 PM, call [place] [phone]

## 2020-06-13 NOTE — Progress Notes (Addendum)
PROGRESS NOTE  Donna Howe Labo MVE:[id number] DOB: 29-Jul-1956 DOA: 06/12/2020 PCP: [facility]  Brief History:  64 year old female with a history of diabetes mellitus type 2, hyperlipidemia, hypertension, CKD stage V, coronary artery disease with NSTEMI and DES, right carotid stenosis, peripheral arterial disease, and obesity presenting with chest pain.  Patient states that she has had intermittent chest pain since 06/10/2020.  She states that she has been taking approximately 2 nitro on a daily basis that relieved her chest discomfort.  She states that her chest discomfort usually last approximately 15 to 20 minutes.  It is worse with exertion.  On 06/12/2020, the patient took a second nitroglycerin without much relief of her chest discomfort.  As result, EMS was activated.  The patient states that the chest discomfort radiated down to her left arm.  It feels like a throbbing sensation.  She has some associated shortness of breath.  She denies any fevers, chills, headache, nausea, vomiting, diarrhea, abdominal pain.  The patient went to see her PCP on 06/09/2020.  She was started on levofloxacin 750 mg daily and prednisone as well as Tessalon Perles for bronchitis.  She denies any NSAIDs. In the emergency department, the patient was afebrile hemodynamically stable.  Oxygen saturation was 91% on room air.  She was placed on 2 L nasal cannula.  BMP showed potassium 4.7, serum creatinine 4.77.  WBC 12.4, hemoglobin 9.5, platelets 195,000.  Chest x-ray showed right basal atelectasis and increased interstitial markings.  The patient was started on IV heparin and IV nitroglycerin.  Troponins 20>>> 39   Assessment/Plan: Angina -EDP spoke with cardiology>>> not a candidate for cath>>ok to stay at [facility] -Presently pain-free -Continue nitroglycerin -Continue IV heparin -Echocardiogram -Cardiology consult -04/09/2018 heart catheterization--ost LAD-prox LAD 80%, ost 1st margina 80%, mid  RCA 90%, LIMA-LAD 70%; DES x 2 placed -Continue aspirin and Plavix -Continue statin -Personally reviewed EKG--sinus rhythm, ST depression V4-V6 -LDL 67  Acute bronchitis -Start duo nebs -start pulmicort -COVID-19 negative -Check viral respiratory panel -Check procalcitonin -Personally reviewed chest x-ray--RLL atelectasis, increased interstitial markings  Acute on chronic renal failure--CKD stage V -Baseline creatinine 3.1-3.4 -Patient had recent blood work at [facility] 05/07/2020 with serum creatinine 4.01 -Likely secondary to hemodynamic changes versus progression of underlying CKD -Gentle hydration -Patient follows Dr. Joelyn Oms in the outpatient setting -Continue bicarbonate -She has an appointment with [facility] 06/17/2020 for what sounds to be venous mapping for fistula  -am BMP  Uncontrolled diabetes mellitus type 2 with hyperglycemia -Reduced dose Levemir -NovoLog sliding scale -Hemoglobin A1c  Essential hypertension -Continue hydralazine -Continue isosorbide -Start low-dose beta-blocker -Continue amlodipine  Hypothyroidism -Continue Synthroid  Hyperlipidemia -Continue statin  Peripheral arterial disease -Continue Pletal     Status is: Observation  The patient will require care spanning > 2 midnights and should be moved to inpatient because: IV treatments appropriate due to intensity of illness or inability to take PO  Dispo: The patient is from: Home              Anticipated d/c is to: Home              Anticipated d/c date is: 2 days              Patient currently is not medically stable to d/c.        Family Communication:  no Family at bedside  Consultants:  cardiology  Code Status:  FULL  DVT Prophylaxis:  IV  Heparin   Procedures: As Listed in Progress Note Above  Antibiotics: None   Total time spent 40 minutes.  Greater than 50% spent face to face counseling and coordinating care.    Subjective: Patient denies fevers, chills,  headache, chest pain, dyspnea, nausea, vomiting, diarrhea, abdominal pain, dysuria, hematuria, hematochezia, and melena.'  Objective: Vitals:   06/13/20 0345 06/13/20 0515 06/13/20 0545 06/13/20 0645  BP: (!) 136/50 (!) 132/54 (!) 142/56 (!) 137/58  Pulse: 66 66 75 63  Resp: 18 17 18 17   Temp:      SpO2: 96% 96% 92% 95%  Weight:      Height:       No intake or output data in the 24 hours ending 06/13/20 0727 Weight change:  Exam:   General:  Pt is alert, follows commands appropriately, not in acute distress  HEENT: No icterus, No thrush, No neck mass, [place]/AT  Cardiovascular: RRR, S1/S2, no rubs, no gallops  Respiratory: Bilateral rales.  Mild expiratory wheeze.  Abdomen: Soft/+BS, non tender, non distended, no guarding  Extremities: trace LE RLE edema, No lymphangitis, No petechiae, No rashes, no synovitis   Data Reviewed: I have personally reviewed following labs and imaging studies Basic Metabolic Panel: Recent Labs  Lab 06/12/20 2118 06/13/20 0023  NA 137  --   K 5.3* 4.7  CL 102  --   CO2 21*  --   GLUCOSE 175*  --   BUN 75*  --   CREATININE 4.77*  --   CALCIUM 9.0  --    Liver Function Tests: No results for input(s): AST, ALT, ALKPHOS, BILITOT, PROT, ALBUMIN in the last 168 hours. No results for input(s): LIPASE, AMYLASE in the last 168 hours. No results for input(s): AMMONIA in the last 168 hours. Coagulation Profile: No results for input(s): INR, PROTIME in the last 168 hours. CBC: Recent Labs  Lab 06/12/20 2118 06/13/20 0601  WBC 12.4* 11.0*  HGB 11.5* 10.5*  HCT 35.6* 33.1*  MCV 93.2 93.0  PLT 195 165   Cardiac Enzymes: No results for input(s): CKTOTAL, CKMB, CKMBINDEX, TROPONINI in the last 168 hours. BNP: Invalid input(s): POCBNP CBG: No results for input(s): GLUCAP in the last 168 hours. HbA1C: No results for input(s): HGBA1C in the last 72 hours. Urine analysis:    Component Value Date/Time   COLORURINE YELLOW 12/18/2019 1353    APPEARANCEUR HAZY (A) 12/18/2019 1353   LABSPEC 1.016 12/18/2019 1353   PHURINE 5.0 12/18/2019 1353   GLUCOSEU 50 (A) 12/18/2019 1353   HGBUR NEGATIVE 12/18/2019 1353   BILIRUBINUR NEGATIVE 12/18/2019 1353   KETONESUR NEGATIVE 12/18/2019 1353   PROTEINUR >=300 (A) 12/18/2019 1353   UROBILINOGEN 0.2 10/26/2013 2229   NITRITE NEGATIVE 12/18/2019 1353   LEUKOCYTESUR NEGATIVE 12/18/2019 1353   Sepsis Labs: @LABRCNTIP (procalcitonin:4,lacticidven:4) ) Recent Results (from the past 240 hour(s))  Respiratory Panel by RT PCR (Flu A&B, Covid) - Nasopharyngeal Swab     Status: None   Collection Time: 06/12/20 10:57 PM   Specimen: Nasopharyngeal Swab  Result Value Ref Range Status   SARS Coronavirus 2 by RT PCR NEGATIVE NEGATIVE Final    Comment: (NOTE) SARS-CoV-2 target nucleic acids are NOT DETECTED.  The SARS-CoV-2 RNA is generally detectable in upper respiratoy specimens during the acute phase of infection. The lowest concentration of SARS-CoV-2 viral copies this assay can detect is 131 copies/mL. A negative result does not preclude SARS-Cov-2 infection and should not be used as the sole basis for treatment or other patient  management decisions. A negative result may occur with  improper specimen collection/handling, submission of specimen other than nasopharyngeal swab, presence of viral mutation(s) within the areas targeted by this assay, and inadequate number of viral copies (<131 copies/mL). A negative result must be combined with clinical observations, patient history, and epidemiological information. The expected result is Negative.  Fact Sheet for Patients:  PinkCheek.be  Fact Sheet for Healthcare Providers:  GravelBags.it  This test is no t yet approved or cleared by the [place] FDA and  has been authorized for detection and/or diagnosis of SARS-CoV-2 by FDA under an Emergency Use Authorization (EUA). This EUA  will remain  in effect (meaning this test can be used) for the duration of the COVID-19 declaration under Section 564(b)(1) of the Act, 21 U.S.C. section 360bbb-3(b)(1), unless the authorization is terminated or revoked sooner.     Influenza A by PCR NEGATIVE NEGATIVE Final   Influenza B by PCR NEGATIVE NEGATIVE Final    Comment: (NOTE) The Xpert Xpress SARS-CoV-2/FLU/RSV assay is intended as an aid in  the diagnosis of influenza from Nasopharyngeal swab specimens and  should not be used as a sole basis for treatment. Nasal washings and  aspirates are unacceptable for Xpert Xpress SARS-CoV-2/FLU/RSV  testing.  Fact Sheet for Patients: PinkCheek.be  Fact Sheet for Healthcare Providers: GravelBags.it  This test is not yet approved or cleared by the [place] FDA and  has been authorized for detection and/or diagnosis of SARS-CoV-2 by  FDA under an Emergency Use Authorization (EUA). This EUA will remain  in effect (meaning this test can be used) for the duration of the  Covid-19 declaration under Section 564(b)(1) of the Act, 21  U.S.C. section 360bbb-3(b)(1), unless the authorization is  terminated or revoked. Performed at [facility], [address] [place], [place] [address]      Scheduled Meds: . alum & mag hydroxide-simeth  30 mL Oral Once   And  . lidocaine  15 mL Oral Once  . amLODipine  10 mg Oral Daily  . aspirin EC  81 mg Oral Daily  . atorvastatin  80 mg Oral QPM  . cilostazol  100 mg Oral BID AC  . clopidogrel  75 mg Oral Daily  . famotidine  20 mg Oral BID  . ferrous sulfate  325 mg Oral Q breakfast  . gabapentin  400 mg Oral BID  . hydrALAZINE  75 mg Oral Q8H  . insulin aspart  0-20 Units Subcutaneous TID WC  . insulin aspart  0-5 Units Subcutaneous QHS  . insulin detemir  15 Units Subcutaneous QHS  . isosorbide dinitrate  5 mg Oral BID  . levothyroxine  125 mcg Oral Daily  . predniSONE  20 mg  Oral BID  . sodium bicarbonate  650 mg Oral BID   Continuous Infusions: . sodium chloride 50 mL/hr at 06/13/20 0228  . heparin 1,000 Units/hr (06/13/20 0230)  . nitroGLYCERIN 20 mcg/min (06/12/20 2334)    Procedures/Studies: DG Chest 2 View  Result Date: 06/12/2020 CLINICAL DATA:  Chest pain and fatigue throughout the day. EXAM: CHEST - 2 VIEW COMPARISON:  06/11/2020 FINDINGS: Cardiac enlargement. No vascular congestion, edema, or consolidation. No pleural effusions. No pneumothorax. Mediastinal contours appear intact. Postoperative changes in the mediastinum. No significant changes since prior study. IMPRESSION: Cardiac enlargement.  No active pulmonary disease. Electronically Signed   By: Lucienne Capers M.D.   On: 06/12/2020 21:40   DG Abd 1 View  Result Date: 06/13/2020 CLINICAL DATA:  Abdominal bloating EXAM: ABDOMEN - 1 VIEW COMPARISON:  Radiograph 01/09/2010, chest radiograph 06/12/2020 FINDINGS: No high-grade obstructive bowel gas pattern is seen. There is a moderate colonic stool burden. About limited evaluation for free air on this supine only radiograph. No Rigler sign or other secondary features. Cholecystectomy clips in the right upper quadrant. Vascular calcium noted in the upper abdomen. Prior sternotomy and CABG as well as vascular stenting of the coronaries seen in the lung bases with some mild atelectatic change. No acute or suspicious osseous abnormalities. Degenerative changes in the lumbar spine, hips and pelvis. IMPRESSION: 1. No high-grade obstructive bowel gas pattern. 2. Moderate colonic stool burden. 3. Prior cholecystectomy. 4. Bibasilar atelectasis. 5. Prior sternotomy and CABG. Electronically Signed   By: Lovena Le M.D.   On: 06/13/2020 02:30    Orson Eva, DO  [facility]  If 7PM-7AM, please contact night-coverage [email or web address] Password [email or web address] 06/13/2020, 7:27 AM   LOS: 0 days

## 2020-06-14 DIAGNOSIS — R0789 Other chest pain: Secondary | ICD-10-CM

## 2020-06-14 DIAGNOSIS — N184 Chronic kidney disease, stage 4 (severe): Secondary | ICD-10-CM

## 2020-06-14 LAB — RENAL FUNCTION PANEL
Albumin: 2 g/dL — ABNORMAL LOW (ref 3.5–5.0)
Albumin: 2.9 g/dL — ABNORMAL LOW (ref 3.5–5.0)
Anion gap: 7 (ref 5–15)
Anion gap: 10 (ref 5–15)
BUN: 19 mg/dL (ref 8–23)
BUN: 81 mg/dL — ABNORMAL HIGH (ref 8–23)
CO2: 32 mmol/L (ref 22–32)
CO2: 22 mmol/L (ref 22–32)
Calcium: 7.7 mg/dL — ABNORMAL LOW (ref 8.9–10.3)
Calcium: 8.1 mg/dL — ABNORMAL LOW (ref 8.9–10.3)
Chloride: 98 mmol/L (ref 98–111)
Chloride: 105 mmol/L (ref 98–111)
Creatinine, Ser: 0.58 mg/dL (ref 0.44–1.00)
Creatinine, Ser: 4.49 mg/dL — ABNORMAL HIGH (ref 0.44–1.00)
GFR calc Af Amer: >60 mL/min (ref >60)
GFR calc Af Amer: 11 mL/min — ABNORMAL LOW (ref >60)
GFR calc non Af Amer: >60 mL/min (ref >60)
GFR calc non Af Amer: 10 mL/min — ABNORMAL LOW (ref >60)
Glucose, Bld: 176 mg/dL — ABNORMAL HIGH (ref 70–99)
Glucose, Bld: 181 mg/dL — ABNORMAL HIGH (ref 70–99)
Phosphorus: 2.3 mg/dL — ABNORMAL LOW (ref 2.5–4.6)
Phosphorus: 5.7 mg/dL — ABNORMAL HIGH (ref 2.5–4.6)
Potassium: 3.8 mmol/L (ref 3.5–5.1)
Potassium: 4.5 mmol/L (ref 3.5–5.1)
Sodium: 137 mmol/L (ref 135–145)
Sodium: 137 mmol/L (ref 135–145)

## 2020-06-14 LAB — HEPARIN LEVEL (UNFRACTIONATED)
Heparin Unfractionated: 0.36 IU/mL (ref 0.30–0.70)
Heparin Unfractionated: 0.37 IU/mL (ref 0.30–0.70)

## 2020-06-14 LAB — CBC WITH DIFFERENTIAL/PLATELET
Abs Immature Granulocytes: 0.27 10*3/uL — ABNORMAL HIGH (ref 0.00–0.07)
Basophils Absolute: 0 10*3/uL (ref 0.0–0.1)
Basophils Relative: 0 %
Eosinophils Absolute: 0 10*3/uL (ref 0.0–0.5)
Eosinophils Relative: 0 %
HCT: 30.9 % — ABNORMAL LOW (ref 36.0–46.0)
Hemoglobin: 9.8 g/dL — ABNORMAL LOW (ref 12.0–15.0)
Immature Granulocytes: 3 %
Lymphocytes Relative: 12 %
Lymphs Abs: 1.2 10*3/uL (ref 0.7–4.0)
MCH: 30.4 pg (ref 26.0–34.0)
MCHC: 31.7 g/dL (ref 30.0–36.0)
MCV: 96 fL (ref 80.0–100.0)
Monocytes Absolute: 0.9 10*3/uL (ref 0.1–1.0)
Monocytes Relative: 9 %
Neutro Abs: 7.5 10*3/uL (ref 1.7–7.7)
Neutrophils Relative %: 76 %
Platelets: 144 10*3/uL — ABNORMAL LOW (ref 150–400)
RBC: 3.22 MIL/uL — ABNORMAL LOW (ref 3.87–5.11)
RDW: 14.8 % (ref 11.5–15.5)
WBC: 9.9 10*3/uL (ref 4.0–10.5)
nRBC: 0 % (ref 0.0–0.2)

## 2020-06-14 LAB — MRSA PCR SCREENING: MRSA by PCR: NEGATIVE

## 2020-06-14 LAB — GLUCOSE, CAPILLARY
Glucose-Capillary: 156 mg/dL — ABNORMAL HIGH (ref 70–99)
Glucose-Capillary: 200 mg/dL — ABNORMAL HIGH (ref 70–99)
Glucose-Capillary: 267 mg/dL — ABNORMAL HIGH (ref 70–99)
Glucose-Capillary: 321 mg/dL — ABNORMAL HIGH (ref 70–99)

## 2020-06-14 MED ORDER — FAMOTIDINE 20 MG PO TABS
10.0000 mg | ORAL_TABLET | Freq: Every day | ORAL | Status: DC
  Administered 2020-06-14 – 2020-06-17 (×4): 10 mg via ORAL
  Filled 2020-06-14 (×4): qty 1

## 2020-06-14 MED ORDER — GUAIFENESIN-DM 100-10 MG/5ML PO SYRP
5.0000 mL | ORAL_SOLUTION | ORAL | Status: DC | PRN
  Administered 2020-06-15: 5 mL via ORAL
  Filled 2020-06-14: qty 5

## 2020-06-14 MED ORDER — ALUM & MAG HYDROXIDE-SIMETH 200-200-20 MG/5ML PO SUSP
30.0000 mL | Freq: Once | ORAL | Status: AC
  Administered 2020-06-14: 30 mL via ORAL

## 2020-06-14 MED ORDER — SODIUM CHLORIDE 0.9 % IV SOLN
INTRAVENOUS | Status: DC
Start: 2020-06-14 — End: 2020-06-14

## 2020-06-14 MED ORDER — SODIUM CHLORIDE 0.9 % IV SOLN
INTRAVENOUS | Status: DC
  Administered 2020-06-14: 75 mL/h via INTRAVENOUS

## 2020-06-14 MED ORDER — ALUM & MAG HYDROXIDE-SIMETH 200-200-20 MG/5ML PO SUSP
30.0000 mL | ORAL | Status: DC | PRN
  Administered 2020-06-16: 30 mL via ORAL
  Filled 2020-06-14 (×2): qty 30

## 2020-06-14 MED ORDER — PANTOPRAZOLE SODIUM 40 MG PO TBEC
40.0000 mg | DELAYED_RELEASE_TABLET | Freq: Every day | ORAL | Status: DC
  Administered 2020-06-14 – 2020-06-17 (×4): 40 mg via ORAL
  Filled 2020-06-14 (×4): qty 1

## 2020-06-14 MED ORDER — HYDRALAZINE HCL 25 MG PO TABS
25.0000 mg | ORAL_TABLET | Freq: Two times a day (BID) | ORAL | Status: DC
  Administered 2020-06-14 (×2): 25 mg via ORAL
  Filled 2020-06-14 (×2): qty 1

## 2020-06-14 NOTE — Progress Notes (Signed)
Pt reports chronic cough related to asthma and asking for cough med with codeine. She states tessalon pearls do not work. Offered her Robitussin DM per standing orders but she says it doesn't help as well. Paged midlevel for possible orders.

## 2020-06-14 NOTE — Progress Notes (Addendum)
ANTICOAGULATION CONSULT NOTE -   Pharmacy Consult for Heparin Indication: chest pain/ACS  Allergies  Allergen Reactions  . Penicillins Other (See Comments)    REACTION: Unknown, told as a child Has patient had a PCN reaction causing immediate rash, facial/tongue/throat swelling, SOB or lightheadedness with hypotension: Unknown Has patient had a PCN reaction causing severe rash involving mucus membranes or skin necrosis: Unknown Has patient had a PCN reaction that required hospitalization: Unknown Has patient had a PCN reaction occurring within the last 10 years: No If all of the above answers are "NO", then may proceed with Cephalosporin use.     Patient Measurements: Height: 5\' 6"  (167.6 cm) Weight: 101.9 kg (224 lb 10.4 oz) IBW/kg (Calculated) : 59.3 Heparin Dosing Weight: 82kg   Vital Signs: Temp: 97.6 F (36.4 C) (09/27 0400) Temp Source: Oral (09/27 0400) BP: 173/52 (09/27 0611) Pulse Rate: 76 (09/27 0611)  Labs: Recent Labs    06/12/20 2118 06/12/20 2118 06/13/20 0023 06/13/20 0601 06/13/20 0601 06/13/20 1441 06/13/20 1452 06/13/20 1642 06/13/20 2332 06/14/20 0717 06/14/20 0841 06/14/20 1204  HGB 11.5*   < >  --  10.5*  --   --   --   --   --   --  9.8*  --   HCT 35.6*  --   --  33.1*  --   --   --   --   --   --  30.9*  --   PLT 195  --   --  165  --   --   --   --   --   --  144*  --   HEPARINUNFRC  --   --   --  0.43   < > 0.22*  --   --  0.37  --   --  0.36  CREATININE 4.77*   < >  --  4.64*  --   --   --   --   --  0.58 4.49*  --   TROPONINIHS 20*   < > 39*  --   --   --  160* 141*  --   --   --   --    < > = values in this interval not displayed.    Estimated Creatinine Clearance: 15.8 mL/min (A) (by C-G formula based on SCr of 4.49 mg/dL (H)).   Assessment: 64 yo W with chest pain. No a candidate for cath, continue with medical management. Pharmacy consulted for heparin.   HL 0.36,therapeutic.  hgb 9.8  Platelets WNL   Goal of Therapy:   Heparin level 0.3-0.7 units/ml Monitor platelets by anticoagulation protocol: Yes   Plan:  Continue heparin to 1250units/hr  Monitor daily HL, CBC/plt Monitor for signs/symptoms of bleeding  Plan is for 48 hours total of heparin (9/28)  Margot Ables, PharmD Clinical Pharmacist 06/14/2020 12:58 PM

## 2020-06-14 NOTE — Progress Notes (Signed)
Pt complaining of chest pain at her left breast extending to her left arm/left shoulder and into back. She has no PRN orders for pain meds. She states that she doesn't feel her pain is related to her heart, but 50/50 heart and indigestion. I offered her Maalox which she initially refused but now is agreeable to. [facility]level switched her Protonix to start tonight and will also give her the maalox. Pt is not diaphoretic at the time and is now saying that the pain has relieved. Will continue to monitor.

## 2020-06-14 NOTE — Progress Notes (Addendum)
PROGRESS NOTE  Donna Howe FHL:[id number] DOB: 01-Feb-1956 DOA: 06/12/2020 PCP: [facility]  Brief History:  64 year old female with a history of diabetes mellitus type 2, hyperlipidemia, hypertension, CKD stage V, coronary artery disease with NSTEMI and DES, right carotid stenosis, peripheral arterial disease, and obesity presenting with chest pain.  Patient states that she has had intermittent chest pain since 06/10/2020.  She states that she has been taking approximately 2 nitro on a daily basis that relieved her chest discomfort.  She states that her chest discomfort usually last approximately 15 to 20 minutes.  It is worse with exertion.  On 06/12/2020, the patient took a second nitroglycerin without much relief of her chest discomfort.  As result, EMS was activated.  The patient states that the chest discomfort radiated down to her left arm.  It feels like a throbbing sensation.  She has some associated shortness of breath.  She denies any fevers, chills, headache, nausea, vomiting, diarrhea, abdominal pain.  The patient went to see her PCP on 06/09/2020.  She was started on levofloxacin 750 mg daily and prednisone as well as Tessalon Perles for bronchitis.  She denies any NSAIDs. In the emergency department, the patient was afebrile hemodynamically stable.  Oxygen saturation was 91% on room air.  She was placed on 2 L nasal cannula.  BMP showed potassium 4.7, serum creatinine 4.77.  WBC 12.4, hemoglobin 9.5, platelets 195,000.  Chest x-ray showed right basal atelectasis and increased interstitial markings.  The patient was started on IV heparin and IV nitroglycerin.  Troponins 20>>> 39>>160>>141  Assessment/Plan: Unstable Angina -Presently pain-free -wean IV nitroglycerin -Continue IV heparin x 48 hours -Echocardiogram--EF 55-60%, no WMA, grade 2 DD -Cardiology consult appreciated -04/09/2018 heart catheterization--ost LAD-prox LAD 80%, ost 1st margina 80%, mid RCA  90%, LIMA-LAD 70%; DES x 2 placed -Continue aspirin and Plavix -Continue statin -Personally reviewed EKG--sinus rhythm, ST depression V4-V6 -LDL 67  Acute respiratory failure with hypoxia -initially upper 80s on RA -stable on 2L -due to COPD and acute bronchitis  Acute bronchitis -continue duo nebs -continue pulmicort -COVID-19 negative -Check viral respiratory panel -Check procalcitonin <0.10 -Personally reviewed chest x-ray--RLL atelectasis, increased interstitial markings  Acute on chronic renal failure--CKD stage V -Baseline creatinine 3.1-3.4 -Patient had recent blood work at [facility] 05/07/2020 with serum creatinine 4.01 -Likely secondary to hemodynamic changes versus progression of underlying CKD -Gentle hydration -Patient follows Dr. Joelyn Oms in the outpatient setting -Continue bicarbonate -She has an appointment with [facility] 06/17/2020 for what sounds to be venous mapping for fistula  -am BMP -consult renal  Diabetes mellitus type 2 with nephropathy -Reduced dose Levemir -NovoLog sliding scale -Hemoglobin A1c--6.2  Essential hypertension -Continue hydralazine--lower dose -holding isosorbide -Started low-dose beta-blocker -holding amlodipine  Hypothyroidism -Continue Synthroid  Hyperlipidemia -Continue statin  Peripheral arterial disease -Continue Pletal       Status is: Inpatient  Remains inpatient appropriate because:Unsafe d/c plan and IV treatments appropriate due to intensity of illness or inability to take PO   Dispo: The patient is from: Home              Anticipated d/c is to: Home              Anticipated d/c date is: 1 day              Patient currently is not medically stable to d/c.        Family Communication:  no Family at  bedside  Consultants:  Cardiology; renal  Code Status:  FULL   DVT Prophylaxis:  IV Heparin    Procedures: As Listed in Progress Note Above  Antibiotics: None      Subjective: Patient  denies fevers, chills, headache, chest pain, dyspnea, nausea, vomiting, diarrhea, abdominal pain, dysuria, hematuria, hematochezia, and melena.   Objective: Vitals:   06/14/20 0400 06/14/20 0500 06/14/20 0611 06/14/20 0821  BP: (!) 125/33 (!) 115/33 (!) 173/52   Pulse: 69 65 76   Resp: 13 14 19    Temp: 97.6 F (36.4 C)     TempSrc: Oral     SpO2: 95% 97% 94% (S) (!) 83%  Weight:      Height:        Intake/Output Summary (Last 24 hours) at 06/14/2020 1443 Last data filed at 06/14/2020 0600 Gross per 24 hour  Intake 1088.97 ml  Output --  Net 1088.97 ml   Weight change: 3.016 kg Exam:   General:  Pt is alert, follows commands appropriately, not in acute distress  HEENT: No icterus, No thrush, No neck mass, [place]/AT  Cardiovascular: RRR, S1/S2, no rubs, no gallops  Respiratory: fine bibasilar crackles. No wheeze  Abdomen: Soft/+BS, non tender, non distended, no guarding  Extremities: No edema, No lymphangitis, No petechiae, No rashes, no synovitis   Data Reviewed: I have personally reviewed following labs and imaging studies Basic Metabolic Panel: Recent Labs  Lab 06/12/20 2118 06/13/20 0023 06/13/20 0601 06/14/20 0717 06/14/20 0841  NA 137  --  138 137 137  K 5.3* 4.7 4.7 3.8 4.5  CL 102  --  105 98 105  CO2 21*  --  22 32 22  GLUCOSE 175*  --  113* 176* 181*  BUN 75*  --  80* 19 81*  CREATININE 4.77*  --  4.64* 0.58 4.49*  CALCIUM 9.0  --  8.7* 7.7* 8.1*  PHOS  --   --   --  2.3* 5.7*   Liver Function Tests: Recent Labs  Lab 06/14/20 0717 06/14/20 0841  ALBUMIN 2.0* 2.9*   No results for input(s): LIPASE, AMYLASE in the last 168 hours. No results for input(s): AMMONIA in the last 168 hours. Coagulation Profile: No results for input(s): INR, PROTIME in the last 168 hours. CBC: Recent Labs  Lab 06/12/20 2118 06/13/20 0601 06/14/20 0841  WBC 12.4* 11.0* 9.9  NEUTROABS  --   --  7.5  HGB 11.5* 10.5* 9.8*  HCT 35.6* 33.1* 30.9*  MCV 93.2 93.0  96.0  PLT 195 165 144*   Cardiac Enzymes: No results for input(s): CKTOTAL, CKMB, CKMBINDEX, TROPONINI in the last 168 hours. BNP: Invalid input(s): POCBNP CBG: Recent Labs  Lab 06/13/20 1232 06/13/20 1737 06/13/20 2102 06/14/20 0731 06/14/20 1113  GLUCAP 198* 303* 257* 156* 200*   HbA1C: Recent Labs    06/13/20 0023  HGBA1C 6.2*   Urine analysis:    Component Value Date/Time   COLORURINE YELLOW 12/18/2019 1353   APPEARANCEUR HAZY (A) 12/18/2019 1353   LABSPEC 1.016 12/18/2019 1353   PHURINE 5.0 12/18/2019 1353   GLUCOSEU 50 (A) 12/18/2019 1353   HGBUR NEGATIVE 12/18/2019 1353   BILIRUBINUR NEGATIVE 12/18/2019 1353   KETONESUR NEGATIVE 12/18/2019 1353   PROTEINUR >=300 (A) 12/18/2019 1353   UROBILINOGEN 0.2 10/26/2013 2229   NITRITE NEGATIVE 12/18/2019 1353   LEUKOCYTESUR NEGATIVE 12/18/2019 1353   Sepsis Labs: @LABRCNTIP (procalcitonin:4,lacticidven:4) ) Recent Results (from the past 240 hour(s))  Respiratory Panel by RT PCR (Flu A&B, Covid) -  Nasopharyngeal Swab     Status: None   Collection Time: 06/12/20 10:57 PM   Specimen: Nasopharyngeal Swab  Result Value Ref Range Status   SARS Coronavirus 2 by RT PCR NEGATIVE NEGATIVE Final    Comment: (NOTE) SARS-CoV-2 target nucleic acids are NOT DETECTED.  The SARS-CoV-2 RNA is generally detectable in upper respiratoy specimens during the acute phase of infection. The lowest concentration of SARS-CoV-2 viral copies this assay can detect is 131 copies/mL. A negative result does not preclude SARS-Cov-2 infection and should not be used as the sole basis for treatment or other patient management decisions. A negative result may occur with  improper specimen collection/handling, submission of specimen other than nasopharyngeal swab, presence of viral mutation(s) within the areas targeted by this assay, and inadequate number of viral copies (<131 copies/mL). A negative result must be combined with  clinical observations, patient history, and epidemiological information. The expected result is Negative.  Fact Sheet for Patients:  PinkCheek.be  Fact Sheet for Healthcare Providers:  GravelBags.it  This test is no t yet approved or cleared by the [place] FDA and  has been authorized for detection and/or diagnosis of SARS-CoV-2 by FDA under an Emergency Use Authorization (EUA). This EUA will remain  in effect (meaning this test can be used) for the duration of the COVID-19 declaration under Section 564(b)(1) of the Act, 21 U.S.C. section 360bbb-3(b)(1), unless the authorization is terminated or revoked sooner.     Influenza A by PCR NEGATIVE NEGATIVE Final   Influenza B by PCR NEGATIVE NEGATIVE Final    Comment: (NOTE) The Xpert Xpress SARS-CoV-2/FLU/RSV assay is intended as an aid in  the diagnosis of influenza from Nasopharyngeal swab specimens and  should not be used as a sole basis for treatment. Nasal washings and  aspirates are unacceptable for Xpert Xpress SARS-CoV-2/FLU/RSV  testing.  Fact Sheet for Patients: PinkCheek.be  Fact Sheet for Healthcare Providers: GravelBags.it  This test is not yet approved or cleared by the [place] FDA and  has been authorized for detection and/or diagnosis of SARS-CoV-2 by  FDA under an Emergency Use Authorization (EUA). This EUA will remain  in effect (meaning this test can be used) for the duration of the  Covid-19 declaration under Section 564(b)(1) of the Act, 21  U.S.C. section 360bbb-3(b)(1), unless the authorization is  terminated or revoked. Performed at [facility], [address], [place], [place] [address]   MRSA PCR Screening     Status: None   Collection Time: 06/13/20  6:51 PM   Specimen: Nasopharyngeal  Result Value Ref Range Status   MRSA by PCR NEGATIVE NEGATIVE Final    Comment:         The GeneXpert MRSA Assay (FDA approved for NASAL specimens only), is one component of a comprehensive MRSA colonization surveillance program. It is not intended to diagnose MRSA infection nor to guide or monitor treatment for MRSA infections. Performed at [facility], [address] [place], [place] [address]      Scheduled Meds: . aspirin EC  81 mg Oral Daily  . atorvastatin  80 mg Oral QPM  . budesonide (PULMICORT) nebulizer solution  0.5 mg Nebulization BID  . Chlorhexidine Gluconate Cloth  6 each Topical Daily  . cilostazol  100 mg Oral BID AC  . clopidogrel  75 mg Oral Daily  . famotidine  20 mg Oral BID  . ferrous sulfate  325 mg Oral Q breakfast  . gabapentin  400 mg Oral BID  .  insulin aspart  0-20 Units Subcutaneous TID WC  . insulin aspart  0-5 Units Subcutaneous QHS  . insulin detemir  15 Units Subcutaneous QHS  . ipratropium-albuterol  3 mL Nebulization TID  . levothyroxine  125 mcg Oral Daily  . metoprolol tartrate  12.5 mg Oral BID  . sodium bicarbonate  650 mg Oral BID   Continuous Infusions: . heparin 1,250 Units/hr (06/13/20 2225)  . nitroGLYCERIN 25 mcg/min (06/14/20 0930)    Procedures/Studies: DG Chest 2 View  Result Date: 06/12/2020 CLINICAL DATA:  Chest pain and fatigue throughout the day. EXAM: CHEST - 2 VIEW COMPARISON:  06/11/2020 FINDINGS: Cardiac enlargement. No vascular congestion, edema, or consolidation. No pleural effusions. No pneumothorax. Mediastinal contours appear intact. Postoperative changes in the mediastinum. No significant changes since prior study. IMPRESSION: Cardiac enlargement.  No active pulmonary disease. Electronically Signed   By: Lucienne Capers M.D.   On: 06/12/2020 21:40   DG Abd 1 View  Result Date: 06/13/2020 CLINICAL DATA:  Abdominal bloating EXAM: ABDOMEN - 1 VIEW COMPARISON:  Radiograph 01/09/2010, chest radiograph 06/12/2020 FINDINGS: No high-grade obstructive bowel gas pattern is seen. There is a moderate  colonic stool burden. About limited evaluation for free air on this supine only radiograph. No Rigler sign or other secondary features. Cholecystectomy clips in the right upper quadrant. Vascular calcium noted in the upper abdomen. Prior sternotomy and CABG as well as vascular stenting of the coronaries seen in the lung bases with some mild atelectatic change. No acute or suspicious osseous abnormalities. Degenerative changes in the lumbar spine, hips and pelvis. IMPRESSION: 1. No high-grade obstructive bowel gas pattern. 2. Moderate colonic stool burden. 3. Prior cholecystectomy. 4. Bibasilar atelectasis. 5. Prior sternotomy and CABG. Electronically Signed   By: Lovena Le M.D.   On: 06/13/2020 02:30   ECHOCARDIOGRAM COMPLETE  Result Date: 06/13/2020    ECHOCARDIOGRAM REPORT   Patient Name:   TAMME MOZINGO Date of Exam: 06/13/2020 Medical Rec #:  [id number]        Height:       66.0 in Accession #:    [id number]       Weight:       218.0 lb Date of Birth:  Apr 16, 1956       BSA:          2.074 m Patient Age:    64 years         BP:           136/61 mmHg Patient Gender: F                HR:           65 bpm. Exam Location:  [facility] Procedure: 2D Echo, Cardiac Doppler and Color Doppler Indications:    Chest pain  History:        Patient has prior history of Echocardiogram examinations, most                 recent 10/02/2019. CAD and Previous Myocardial Infarction, Prior                 CABG, PAD, Signs/Symptoms:Chest Pain; Risk Factors:Diabetes,                 Hypertension, Dyslipidemia and Current Smoker. CKD.  Sonographer:    Dustin Flock RDCS Referring Phys: [id number] ASIA B [place]  Sonographer Comments: Patient is morbidly obese. IMPRESSIONS  1. Left ventricular ejection fraction, by estimation, is 55 to 60%. The left ventricle  has normal function. The left ventricle has no regional wall motion abnormalities. There is mild concentric left ventricular hypertrophy. Left ventricular diastolic  parameters are consistent with Grade II diastolic dysfunction (pseudonormalization). Elevated left ventricular end-diastolic pressure.  2. Right ventricular systolic function is normal. The right ventricular size is normal. There is normal pulmonary artery systolic pressure.  3. Left atrial size was mildly dilated.  4. Right atrial size was mildly dilated.  5. The mitral valve is normal in structure. Trivial mitral valve regurgitation. No evidence of mitral stenosis.  6. The aortic valve is tricuspid. Aortic valve regurgitation is not visualized. No aortic stenosis is present.  7. The inferior vena cava is normal in size with greater than 50% respiratory variability, suggesting right atrial pressure of 3 mmHg. FINDINGS  Left Ventricle: Left ventricular ejection fraction, by estimation, is 55 to 60%. The left ventricle has normal function. The left ventricle has no regional wall motion abnormalities. The left ventricular internal cavity size was normal in size. There is  mild concentric left ventricular hypertrophy. Left ventricular diastolic parameters are consistent with Grade II diastolic dysfunction (pseudonormalization). Elevated left ventricular end-diastolic pressure. Right Ventricle: The right ventricular size is normal. No increase in right ventricular wall thickness. Right ventricular systolic function is normal. There is normal pulmonary artery systolic pressure. The tricuspid regurgitant velocity is 2.31 m/s, and  with an assumed right atrial pressure of 3 mmHg, the estimated right ventricular systolic pressure is 34.1 mmHg. Left Atrium: Left atrial size was mildly dilated. Right Atrium: Right atrial size was mildly dilated. Pericardium: There is no evidence of pericardial effusion. Mitral Valve: The mitral valve is normal in structure. Trivial mitral valve regurgitation. No evidence of mitral valve stenosis. Tricuspid Valve: The tricuspid valve is normal in structure. Tricuspid valve regurgitation is  trivial. No evidence of tricuspid stenosis. Aortic Valve: The aortic valve is tricuspid. Aortic valve regurgitation is not visualized. No aortic stenosis is present. Pulmonic Valve: The pulmonic valve was normal in structure. Pulmonic valve regurgitation is not visualized. No evidence of pulmonic stenosis. Aorta: The aortic root is normal in size and structure. Venous: The inferior vena cava is normal in size with greater than 50% respiratory variability, suggesting right atrial pressure of 3 mmHg. IAS/Shunts: No atrial level shunt detected by color flow Doppler.  LEFT VENTRICLE PLAX 2D LVIDd:         5.18 cm  Diastology LVIDs:         3.64 cm  LV e' medial:    6.09 cm/s LV PW:         1.31 cm  LV E/e' medial:  19.5 LV IVS:        1.31 cm  LV e' lateral:   7.29 cm/s LVOT diam:     1.80 cm  LV E/e' lateral: 16.3 LV SV:         79 LV SV Index:   38 LVOT Area:     2.54 cm  RIGHT VENTRICLE RV Basal diam:  3.50 cm RV S prime:     7.72 cm/s TAPSE (M-mode): 2.5 cm LEFT ATRIUM             Index       RIGHT ATRIUM           Index LA diam:        3.70 cm 1.78 cm/m  RA Area:     19.70 cm LA Vol (A2C):   29.1 ml 14.03 ml/m RA Volume:   62.30  ml  30.03 ml/m LA Vol (A4C):   67.1 ml 32.35 ml/m LA Biplane Vol: 48.3 ml 23.28 ml/m  AORTIC VALVE LVOT Vmax:   129.00 cm/s LVOT Vmean:  92.200 cm/s LVOT VTI:    0.312 m  AORTA Ao Root diam: 3.30 cm MITRAL VALVE                TRICUSPID VALVE MV Area (PHT): 2.71 cm     TR Peak grad:   21.3 mmHg MV Decel Time: 280 msec     TR Vmax:        231.00 cm/s MV E velocity: 119.00 cm/s MV A velocity: 104.00 cm/s  SHUNTS MV E/A ratio:  1.14         Systemic VTI:  0.31 m                             Systemic Diam: 1.80 cm Skeet Latch md Electronically signed by Skeet Latch md Signature Date/Time: 06/13/2020/12:43:41 PM    Final     Orson Eva, DO  [facility]  If 7PM-7AM, please contact night-coverage [email or web address] Password [email or web address] 06/14/2020, 2:43 PM   LOS: 1 day

## 2020-06-14 NOTE — Progress Notes (Signed)
Inpatient Diabetes Program Recommendations  AACE/ADA: New Consensus Statement on Inpatient Glycemic Control   Target Ranges:  Prepandial:   less than 140 mg/dL      Peak postprandial:   less than 180 mg/dL (1-2 hours)      Critically ill patients:  140 - 180 mg/dL   Results for Donna Howe, Donna Howe (MRN [id number]) as of 06/14/2020 13:01  Ref. Range 06/13/2020 08:52 06/13/2020 12:32 06/13/2020 17:37 06/13/2020 21:02 06/14/2020 07:31 06/14/2020 11:13  Glucose-Capillary Latest Ref Range: 70 - 99 mg/dL 88 198 (H) 303 (H) 257 (H) 156 (H) 200 (H)   Review of Glycemic Control  Diabetes history: DM2 Outpatient Diabetes medications: 70/30 29 units BID with meals Current orders for Inpatient glycemic control: Levemir 15 units QHS, Novolog 0-20 units TID with meals, Novolog 0-5 units QHS; Prednisone 20 mg QAM  Inpatient Diabetes Program Recommendations:    Insulin: If steroids are continued, please consider ordering Novolog 3 units TID with meals for meal coverage if patient eats at least 50% of meals.  Thanks, Barnie Alderman, RN, MSN, CDE Diabetes Coordinator Inpatient Diabetes Program [phone] (Team Pager from 8am to 5pm)

## 2020-06-14 NOTE — Consult Note (Addendum)
Cardiology Consultation:   Patient ID: Donna Howe MRN: [id number]; DOB: 1956/09/10  Admit date: 06/12/2020 Date of Consult: 06/14/2020  Primary Care Provider: Practice, [facility] Cardiologist: Rozann Lesches, MD  [place] Electrophysiologist:  None    Patient Profile:   Donna Howe is a 64 y.o. female with a hx of CAD who is being seen today for the evaluation of NSTEMI at the request of Dr. Carles Collet.  History of Present Illness:   Donna Howe   with history of CAD status post CABG in 2005, stent SVG to RCA and Cutting Balloon to the proximal circumflex 05/2017, PTCA to the proximal circumflex stage DES to the SVG to RCA and DES LIMA to the LAD 03/2018 also has hypertension, hyperlipidemia, CKD stage IIIb. 2D echo 10/02/2019 LVEF 60 to 65%.   Patient discharged from the hospital 12/19/2019 after being treated for pneumonia.  Troponins elevated 384 and 43 and EKG had some inferior lateral ST abnormalities felt likely secondary to pneumonia.   Patient came in yesterday with NSTEMI. Talked to [facility] team and recommended medical therapy with CKD. Echo yest with normal LVEF no WMA. Troponins 20, 39, 160, 141. Crt. 4.49. Patient says she's being treated for pneumonia and bronchitis. Yesterday she developed chest tightness that was worse than before relieved with NTG. It came back and she came to ED. She is doing better on IV heparin and IV NTG but had mild tightness this am when walking to bathroom. Is schedule to see vascular on Thurs for fistula for impending dialysis. Prefers not to have a cath at this time until she is on dialysis.   Past Medical History:  Diagnosis Date  . Anemia   . Asthma   . CAD (coronary artery disease)    Multivessel s/p CABG 2005, numerous PCIs since that time  . Carotid artery disease (HCC)    R CEA  . Chronic kidney disease (CKD), stage III (moderate)   . Essential hypertension   . Gout   . Hyperlipidemia   . Hypothyroidism    . Myocardial infarction ([place])   . PAD (peripheral artery disease) ([place])    Dr. Kellie Simmering  . S/P angioplasty with stent- DES to [facility] and to LIMA to LAD with DES 04/09/18.   04/10/2018  . SBO (small bowel obstruction) ([place]) 2011   Status post lysis of adhesions & hernia repair  . Sinus bradycardia   . Thrombocytopenia, unspecified ([place])   . Type 2 diabetes mellitus ([place])   . Umbilical hernia     Past Surgical History:  Procedure Laterality Date  . CESAREAN SECTION  1984  . CHOLECYSTECTOMY  2010  . CORONARY ARTERY BYPASS GRAFT  2005  . CORONARY BALLOON ANGIOPLASTY N/A 05/31/2017   Procedure: CORONARY BALLOON ANGIOPLASTY;  Surgeon: Jettie Booze, MD;  Location: [place] [facility];  Service: Cardiovascular;  Laterality: N/A;  . CORONARY STENT INTERVENTION N/A 05/31/2017   Procedure: CORONARY STENT INTERVENTION;  Surgeon: Jettie Booze, MD;  Location: [place] [facility];  Service: Cardiovascular;  Laterality: N/A;  . CORONARY STENT INTERVENTION N/A 04/09/2018   Procedure: CORONARY STENT INTERVENTION;  Surgeon: Jettie Booze, MD;  Location: [place] [facility];  Service: Cardiovascular;  Laterality: N/A;  SVG RCA  . ENDARTERECTOMY Right 04/18/2013   Procedure: ENDARTERECTOMY CAROTID;  Surgeon: Mal Misty, MD;  Location: [place];  Service: Vascular;  Laterality: Right;  . HERNIA REPAIR  1989  . Incisional hernia repair x2  03/04/2010  Laparoscopic with 35cm mesh by Dr Ronnald Collum  . LEFT HEART CATH AND CORS/GRAFTS ANGIOGRAPHY N/A 05/31/2017   Procedure: LEFT HEART CATH AND CORS/GRAFTS ANGIOGRAPHY;  Surgeon: Jettie Booze, MD;  Location: [place] [facility];  Service: Cardiovascular;  Laterality: N/A;  . LEFT HEART CATH AND CORS/GRAFTS ANGIOGRAPHY N/A 04/08/2018   Procedure: LEFT HEART CATH AND CORS/GRAFTS ANGIOGRAPHY;  Surgeon: Jettie Booze, MD;  Location: [place] [facility];  Service: Cardiovascular;  Laterality: N/A;  . LEFT HEART CATHETERIZATION WITH CORONARY  ANGIOGRAM N/A 12/19/2012   Procedure: LEFT HEART CATHETERIZATION WITH CORONARY ANGIOGRAM;  Surgeon: Josue Hector, MD;  Location: [facility] C[facility] LAB;  Service: Cardiovascular;  Laterality: N/A;  . LEFT HEART CATHETERIZATION WITH CORONARY/GRAFT ANGIOGRAM N/A 04/19/2013   Procedure: LEFT HEART CATHETERIZATION WITH Beatrix Fetters;  Surgeon: Lorretta Harp, MD;  Location: [facility] CATH LAB;  Service: Cardiovascular;  Laterality: N/A;  . PATCH ANGIOPLASTY Right 04/18/2013   Procedure: PATCH ANGIOPLASTY Right Internal Carotid Artery;  Surgeon: Mal Misty, MD;  Location: [place];  Service: Vascular;  Laterality: Right;  . PERCUTANEOUS CORONARY STENT INTERVENTION (PCI-S) Right 12/19/2012   Procedure: PERCUTANEOUS CORONARY STENT INTERVENTION (PCI-S);  Surgeon: Josue Hector, MD;  Location: [facility] CATH LAB;  Service: Cardiovascular;  Laterality: Right;  . SHOULDER SURGERY       Home Medications:  Prior to Admission medications   Medication Sig Start Date End Date Taking? Authorizing Provider  ALPRAZolam Duanne Moron) 0.5 MG tablet Take 0.25-0.5 mg by mouth See admin instructions. Take 0.25 mg tab by mouth every morning & evening as needed  and .05 mg tab at bedtime   Yes [provider]  amLODipine (NORVASC) 10 MG tablet Take 10 mg by mouth daily.    Yes [provider]  aspirin EC 81 MG tablet Take 81 mg by mouth daily.    Yes [provider]  atorvastatin (LIPITOR) 80 MG tablet Take 1 tablet (80 mg total) by mouth every evening. 12/19/19  Yes Johnson, Clanford L, MD  benzonatate (TESSALON) 100 MG capsule Take 100 mg by mouth 3 (three) times daily as needed for cough.   Yes [provider]  clopidogrel (PLAVIX) 75 MG tablet Take 1 tablet (75 mg total) by mouth daily. 08/27/18  Yes Satira Sark, MD  colchicine 0.6 MG tablet Take 0.6 mg by mouth daily.    Yes [provider]  famotidine (PEPCID) 20 MG tablet One after bfast and supper Patient taking differently: Take 20  mg by mouth 2 (two) times daily. One after bfast and supper 11/20/19  Yes Tanda Rockers, MD  ferrous sulfate 325 (65 FE) MG tablet Take 325 mg by mouth daily with breakfast.   Yes [provider]  gabapentin (NEURONTIN) 400 MG capsule Take 1 capsule (400 mg total) by mouth 4 (four) times daily. Patient taking differently: Take 400 mg by mouth 2 (two) times daily.  11/20/19  Yes Tanda Rockers, MD  hydrALAZINE (APRESOLINE) 50 MG tablet Take 75 mg by mouth in the morning and at bedtime.  06/10/19  Yes [provider]  ipratropium (ATROVENT HFA) 17 MCG/ACT inhaler Inhale 2 puffs into the lungs See admin instructions. Inhale 2 puffs every 4 to 6 hrs as needed.   Yes [provider]  isosorbide dinitrate (ISORDIL) 5 MG tablet Take 5 mg by mouth 2 (two) times daily.   Yes [provider]  levofloxacin (LEVAQUIN) 750 MG tablet Take 750 mg by mouth daily.  Yes [provider]  levothyroxine (SYNTHROID, LEVOTHROID) 125 MCG tablet Take 125 mcg by mouth daily.    Yes [provider]  nitroGLYCERIN (NITROSTAT) 0.4 MG SL tablet Place 0.4 mg under the tongue every 5 (five) minutes as needed for chest pain. Not to exceed 3 in 15 minute time frame   Yes [provider]  NOVOLIN 70/30 RELION (70-30) 100 UNIT/ML injection Inject 29 Units into the skin 2 (two) times daily with a meal. Am & PM 08/28/15  Yes [provider]  ondansetron (ZOFRAN ODT) 8 MG disintegrating tablet Take 1 tablet (8 mg total) by mouth every 8 (eight) hours as needed for nausea or vomiting. 08/14/15  Yes Idol, Almyra Free, PA-C  predniSONE (DELTASONE) 20 MG tablet Take 20 mg by mouth 2 (two) times daily.   Yes [provider]  sodium bicarbonate 650 MG tablet Take 650 mg by mouth 2 (two) times daily.   Yes [provider]  Albuterol Sulfate 108 (90 Base) MCG/ACT AEPB Inhale 2 puffs into the lungs every 4 (four) hours as needed (Shortness of breath).  Patient not  taking: Reported on 06/12/2020    [provider]  cilostazol (PLETAL) 100 MG tablet Take 1 tablet (100 mg total) by mouth 2 (two) times daily before a meal. Patient not taking: Reported on 06/12/2020 12/16/19   Marty Heck, MD  Vitamin D, Ergocalciferol, (DRISDOL) 1.25 MG (50000 UNIT) CAPS capsule Take 50,000 Units by mouth once a week. 12/31/19   [provider]    Inpatient Medications: Scheduled Meds: . aspirin EC  81 mg Oral Daily  . atorvastatin  80 mg Oral QPM  . budesonide (PULMICORT) nebulizer solution  0.5 mg Nebulization BID  . Chlorhexidine Gluconate Cloth  6 each Topical Daily  . cilostazol  100 mg Oral BID AC  . clopidogrel  75 mg Oral Daily  . famotidine  20 mg Oral BID  . ferrous sulfate  325 mg Oral Q breakfast  . gabapentin  400 mg Oral BID  . insulin aspart  0-20 Units Subcutaneous TID WC  . insulin aspart  0-5 Units Subcutaneous QHS  . insulin detemir  15 Units Subcutaneous QHS  . ipratropium-albuterol  3 mL Nebulization TID  . levothyroxine  125 mcg Oral Daily  . metoprolol tartrate  12.5 mg Oral BID  . predniSONE  20 mg Oral Q breakfast  . sodium bicarbonate  650 mg Oral BID   Continuous Infusions: . heparin 1,250 Units/hr (06/13/20 2225)  . nitroGLYCERIN 25 mcg/min (06/14/20 0930)   PRN Meds: acetaminophen, albuterol, ALPRAZolam, ondansetron (ZOFRAN) IV  Allergies:    Allergies  Allergen Reactions  . Penicillins Other (See Comments)    REACTION: Unknown, told as a child Has patient had a PCN reaction causing immediate rash, facial/tongue/throat swelling, SOB or lightheadedness with hypotension: Unknown Has patient had a PCN reaction causing severe rash involving mucus membranes or skin necrosis: Unknown Has patient had a PCN reaction that required hospitalization: Unknown Has patient had a PCN reaction occurring within the last 10 years: No If all of the above answers are "NO", then may proceed with Cephalosporin use.      Social History:   Social History   Socioeconomic History  . Marital status: Divorced    Spouse name: Not on file  . Number of children: Not on file  . Years of education: Not on file  . Highest education level: Not on file  Occupational History  . Occupation: Disabled  Tobacco Use  . Smoking status: Former Smoker    Packs/day: 1.00    Years: 20.00    Pack years: 20.00    Types: Cigarettes    Quit date: 12/10/2012    Years since quitting: 7.5  . Smokeless tobacco: Never Used  Vaping Use  . Vaping Use: Never used  Substance and Sexual Activity  . Alcohol use: No    Alcohol/week: 0.0 standard drinks  . Drug use: No  . Sexual activity: Not Currently  Other Topics Concern  . Not on file  Social History Narrative   Lives with mother.   Social Determinants of Health   Financial Resource Strain:   . Difficulty of Paying Living Expenses: Not on file  Food Insecurity:   . Worried About Charity fundraiser in the Last Year: Not on file  . Ran Out of Food in the Last Year: Not on file  Transportation Needs:   . Lack of Transportation (Medical): Not on file  . Lack of Transportation (Non-Medical): Not on file  Physical Activity:   . Days of Exercise per Week: Not on file  . Minutes of Exercise per Session: Not on file  Stress:   . Feeling of Stress : Not on file  Social Connections:   . Frequency of Communication with Friends and Family: Not on file  . Frequency of Social Gatherings with Friends and Family: Not on file  . Attends Religious Services: Not on file  . Active Member of Clubs or Organizations: Not on file  . Attends Archivist Meetings: Not on file  . Marital Status: Not on file  Intimate Partner Violence:   . Fear of Current or Ex-Partner: Not on file  . Emotionally Abused: Not on file  . Physically Abused: Not on file  . Sexually Abused: Not on file    Family History:     Family History  Problem Relation Age of Onset  . Diabetes Mother    . Heart disease Mother        before age 4  . Hyperlipidemia Mother   . Hypertension Mother   . Thyroid disease Father   . Hypertension Father   . AAA (abdominal aortic aneurysm) Father   . Heart disease Brother        before age 22  . Hypertension Brother   . Hyperlipidemia Son   . Hypertension Son      ROS:  Please see the history of present illness.  Review of Systems  Constitutional: Negative.  HENT: Negative.   Eyes: Negative.   Cardiovascular: Positive for chest pain and dyspnea on exertion.  Respiratory: Positive for cough, shortness of breath and wheezing.   Hematologic/Lymphatic: Negative.   Musculoskeletal: Negative.  Negative for joint pain.  Gastrointestinal: Negative.   Genitourinary: Negative.   Neurological: Negative.     All other ROS reviewed and negative.     Physical Exam/Data:   Vitals:   06/14/20 0400 06/14/20 0500 06/14/20 0611 06/14/20 0821  BP: (!) 125/33 (!) 115/33 (!) 173/52   Pulse: 69 65 76   Resp: 13 14 19    Temp: 97.6 F (36.4 C)     TempSrc: Oral     SpO2: 95% 97% 94% (S) (!) 83%  Weight:      Height:        Intake/Output Summary (Last 24 hours) at 06/14/2020 1026 Last data filed at 06/14/2020 0600 Gross per 24 hour  Intake 1088.97 ml  Output --  Net  1088.97 ml   Last 3 Weights 06/13/2020 06/12/2020 03/19/2020  Weight (lbs) 224 lb 10.4 oz 218 lb 218 lb  Weight (kg) 101.9 kg 98.884 kg 98.884 kg     Body mass index is 36.26 kg/m.  General:  Well nourished, well developed, in no acute distress HEENT: normal Lymph: no adenopathy Neck: no JVD Endocrine:  No thryomegaly Vascular: No carotid bruits; FA pulses 2+ bilaterally without bruits  Cardiac:  normal S1, S2; RRR; 2/6 systolic murmur LSB Lungs: decreased breath sounds with diffuse wheezing throughout.Abd: soft, nontender, no hepatomegaly  Ext: no ed ema Musculoskeletal:  No deformities, BUE and BLE strength normal and equal Skin: warm and dry  Neuro:  CNs 2-12 intact, no  focal abnormalities noted Psych:  Normal affect   EKG:  The EKG was personally reviewed and demonstrates:  NSR with IVCD and nonspecific ST T wave changes  Telemetry:  Telemetry was personally reviewed and demonstrates:  NSR with PVC's, couplets, PAC's  Relevant CV Studies:  Carotid Dopplers 12/16/19 Summary:  Right Carotid: Velocities in the right ICA are consistent with a 60-79%                 stenosis. The ECA appears >50% stenosed.   Left Carotid: Velocities in the left ICA are consistent with a 1-39%  stenosis.   Vertebrals: Bilateral vertebral arteries demonstrate antegrade flow.   *See table(s) above for measurements and observations.      Electronically signed by Monica Martinez MD on 12/16/2019 at 5:12:06 PM.        Echocardiogram 10/02/2019:  1. Left ventricular ejection fraction, by visual estimation, is 60 to 65%. The left ventricle has normal function. There is mildly increased left ventricular hypertrophy.  2. Left ventricular diastolic parameters are indeterminate.  3. The left ventricle has no regional wall motion abnormalities.  4. Global right ventricle has normal systolic function.The right ventricular size is normal. No increase in right ventricular wall thickness.  5. Left atrial size was normal.  6. Right atrial size was normal.  7. The mitral valve is normal in structure. Trivial mitral valve regurgitation. No evidence of mitral stenosis.  8. The tricuspid valve is not well visualized.  9. The aortic valve is normal in structure. Aortic valve regurgitation is not visualized. No evidence of aortic valve sclerosis or stenosis. 10. The pulmonic valve was not well visualized. Pulmonic valve regurgitation is not visualized.     LHC 04/09/2018  Non-stenotic Ost LM to LM lesion previously treated.  Ost LAD to Prox LAD lesion is 80% stenosed.  Non-stenotic Ost Cx to Mid Cx lesion previously treated.  1st Mrg-1 lesion is 70% stenosed.  Ost 1st Mrg lesion  is 80% stenosed.  1st Mrg-2 lesion is 10% stenosed.  Prox Cx to Mid Cx lesion is 50% stenosed.  Patent stent in the ostium of the SVG to the RCA  Mid RCA lesion is 90% stenosed.  A drug-eluting stent was successfully placed using a STENT SIERRA 2.50 X 15 MM, through the SVG to RCA graft.  Post intervention, there is a 0% residual stenosis.  Origin lesion is 70% stenosed of the LIMA to LAD.  A drug-eluting stent was successfully placed using a STENT SYNERGY DES 3X12, postdilated to 3.25 mm.  Post intervention, there is a 0% residual stenosis.     Recommend uninterrupted dual antiplatelet therapy with Aspirin 81mg  daily and Ticagrelor 90mg  twice daily for a minimum of 12 months (ACS - Class I recommendation).  Continue aggressive secondary prevention.     LHC 04/08/2018  Non-stenotic Ost LM to LM lesion previously treated.  Ost LAD to Prox LAD lesion is 80% stenosed. LIMA to LAD is patent with a proximal 60% lesion.  Non-stenotic Ost Cx to Mid Cx lesion previously treated.  Prox Cx to Mid Cx lesion is 50% stenosed.  Balloon angioplasty was performed.  1st Mrg-1 lesion is 70% stenosed.  Ost 1st Mrg lesion is 80% stenosed.  1st Mrg-2 lesion is 10% stenosed.  Origin to Prox Graft lesion is 100% stenosed. SVG to OM is occluded.  Previously placed Origin drug eluting stent is widely patent.  Balloon angioplasty was performed.  Mid RCA lesion is 90% stenosed. THis is past the insertion of the graft.  LV end diastolic pressure is mildly elevated.  There is no aortic valve stenosis.  Origin lesion is 60% stenosed.       Recommend dual antiplatelet therapy with Aspirin 81mg  daily and Clopidogrel 75mg  daily long-term (beyond 12 months) because of extensive CAD, and multiple stents..    Will look into repeat PCI attempt tomorrow if Cr is ok.  Could consider redo CABG eval as well given extensive circ disease and now two lesions in the SVG to RCA in the last 12 months,  proximal LIMA lesion.  Will discuss with interventional colleagues.     Laboratory Data:  High Sensitivity Troponin:   Recent Labs  Lab 06/12/20 2118 06/13/20 0023 06/13/20 1452 06/13/20 1642  TROPONINIHS 20* 39* 160* 141*     Chemistry Recent Labs  Lab 06/13/20 0601 06/14/20 0717 06/14/20 0841  NA 138 137 137  K 4.7 3.8 4.5  CL 105 98 105  CO2 22 32 22  GLUCOSE 113* 176* 181*  BUN 80* 19 81*  CREATININE 4.64* 0.58 4.49*  CALCIUM 8.7* 7.7* 8.1*  GFRNONAA 10* >60 10*  GFRAA 11* >60 11*  ANIONGAP 11 7 10     Recent Labs  Lab 06/14/20 0717 06/14/20 0841  ALBUMIN 2.0* 2.9*   Hematology Recent Labs  Lab 06/12/20 2118 06/13/20 0601 06/14/20 0841  WBC 12.4* 11.0* 9.9  RBC 3.82* 3.56* 3.22*  HGB 11.5* 10.5* 9.8*  HCT 35.6* 33.1* 30.9*  MCV 93.2 93.0 96.0  MCH 30.1 29.5 30.4  MCHC 32.3 31.7 31.7  RDW 14.9 14.6 14.8  PLT 195 165 144*   BNPNo results for input(s): BNP, PROBNP in the last 168 hours.  DDimer No results for input(s): DDIMER in the last 168 hours.   Radiology/Studies:  DG Chest 2 View  Result Date: 06/12/2020 CLINICAL DATA:  Chest pain and fatigue throughout the day. EXAM: CHEST - 2 VIEW COMPARISON:  06/11/2020 FINDINGS: Cardiac enlargement. No vascular congestion, edema, or consolidation. No pleural effusions. No pneumothorax. Mediastinal contours appear intact. Postoperative changes in the mediastinum. No significant changes since prior study. IMPRESSION: Cardiac enlargement.  No active pulmonary disease. Electronically Signed   By: Lucienne Capers M.D.   On: 06/12/2020 21:40   DG Abd 1 View  Result Date: 06/13/2020 CLINICAL DATA:  Abdominal bloating EXAM: ABDOMEN - 1 VIEW COMPARISON:  Radiograph 01/09/2010, chest radiograph 06/12/2020 FINDINGS: No high-grade obstructive bowel gas pattern is seen. There is a moderate colonic stool burden. About limited evaluation for free air on this supine only radiograph. No Rigler sign or other secondary  features. Cholecystectomy clips in the right upper quadrant. Vascular calcium noted in the upper abdomen. Prior sternotomy and CABG as well as vascular stenting of the coronaries seen in the  lung bases with some mild atelectatic change. No acute or suspicious osseous abnormalities. Degenerative changes in the lumbar spine, hips and pelvis. IMPRESSION: 1. No high-grade obstructive bowel gas pattern. 2. Moderate colonic stool burden. 3. Prior cholecystectomy. 4. Bibasilar atelectasis. 5. Prior sternotomy and CABG. Electronically Signed   By: Lovena Le M.D.   On: 06/13/2020 02:30   ECHOCARDIOGRAM COMPLETE  Result Date: 06/13/2020    ECHOCARDIOGRAM REPORT   Patient Name:   SORA VROOMAN Date of Exam: 06/13/2020 Medical Rec #:  [id number]        Height:       66.0 in Accession #:    [id number]       Weight:       218.0 lb Date of Birth:  [birth year]       BSA:          2.074 m Patient Age:    14 years         BP:           136/61 mmHg Patient Gender: F                HR:           65 bpm. Exam Location:  [facility] Procedure: 2D Echo, Cardiac Doppler and Color Doppler Indications:    Chest pain  History:        Patient has prior history of Echocardiogram examinations, most                 recent 10/02/2019. CAD and Previous Myocardial Infarction, Prior                 CABG, PAD, Signs/Symptoms:Chest Pain; Risk Factors:Diabetes,                 Hypertension, Dyslipidemia and Current Smoker. CKD.  Sonographer:    Dustin Flock RDCS Referring Phys: [id number] ASIA B [place]  Sonographer Comments: Patient is morbidly obese. IMPRESSIONS  1. Left ventricular ejection fraction, by estimation, is 55 to 60%. The left ventricle has normal function. The left ventricle has no regional wall motion abnormalities. There is mild concentric left ventricular hypertrophy. Left ventricular diastolic parameters are consistent with Grade II diastolic dysfunction (pseudonormalization). Elevated left ventricular end-diastolic  pressure.  2. Right ventricular systolic function is normal. The right ventricular size is normal. There is normal pulmonary artery systolic pressure.  3. Left atrial size was mildly dilated.  4. Right atrial size was mildly dilated.  5. The mitral valve is normal in structure. Trivial mitral valve regurgitation. No evidence of mitral stenosis.  6. The aortic valve is tricuspid. Aortic valve regurgitation is not visualized. No aortic stenosis is present.  7. The inferior vena cava is normal in size with greater than 50% respiratory variability, suggesting right atrial pressure of 3 mmHg. FINDINGS  Left Ventricle: Left ventricular ejection fraction, by estimation, is 55 to 60%. The left ventricle has normal function. The left ventricle has no regional wall motion abnormalities. The left ventricular internal cavity size was normal in size. There is  mild concentric left ventricular hypertrophy. Left ventricular diastolic parameters are consistent with Grade II diastolic dysfunction (pseudonormalization). Elevated left ventricular end-diastolic pressure. Right Ventricle: The right ventricular size is normal. No increase in right ventricular wall thickness. Right ventricular systolic function is normal. There is normal pulmonary artery systolic pressure. The tricuspid regurgitant velocity is 2.31 m/s, and  with an assumed right atrial pressure of 3 mmHg, the estimated right ventricular systolic pressure  is 24.3 mmHg. Left Atrium: Left atrial size was mildly dilated. Right Atrium: Right atrial size was mildly dilated. Pericardium: There is no evidence of pericardial effusion. Mitral Valve: The mitral valve is normal in structure. Trivial mitral valve regurgitation. No evidence of mitral valve stenosis. Tricuspid Valve: The tricuspid valve is normal in structure. Tricuspid valve regurgitation is trivial. No evidence of tricuspid stenosis. Aortic Valve: The aortic valve is tricuspid. Aortic valve regurgitation is not  visualized. No aortic stenosis is present. Pulmonic Valve: The pulmonic valve was normal in structure. Pulmonic valve regurgitation is not visualized. No evidence of pulmonic stenosis. Aorta: The aortic root is normal in size and structure. Venous: The inferior vena cava is normal in size with greater than 50% respiratory variability, suggesting right atrial pressure of 3 mmHg. IAS/Shunts: No atrial level shunt detected by color flow Doppler.  LEFT VENTRICLE PLAX 2D LVIDd:         5.18 cm  Diastology LVIDs:         3.64 cm  LV e' medial:    6.09 cm/s LV PW:         1.31 cm  LV E/e' medial:  19.5 LV IVS:        1.31 cm  LV e' lateral:   7.29 cm/s LVOT diam:     1.80 cm  LV E/e' lateral: 16.3 LV SV:         79 LV SV Index:   38 LVOT Area:     2.54 cm  RIGHT VENTRICLE RV Basal diam:  3.50 cm RV S prime:     7.72 cm/s TAPSE (M-mode): 2.5 cm LEFT ATRIUM             Index       RIGHT ATRIUM           Index LA diam:        3.70 cm 1.78 cm/m  RA Area:     19.70 cm LA Vol (A2C):   29.1 ml 14.03 ml/m RA Volume:   62.30 ml  30.03 ml/m LA Vol (A4C):   67.1 ml 32.35 ml/m LA Biplane Vol: 48.3 ml 23.28 ml/m  AORTIC VALVE LVOT Vmax:   129.00 cm/s LVOT Vmean:  92.200 cm/s LVOT VTI:    0.312 m  AORTA Ao Root diam: 3.30 cm MITRAL VALVE                TRICUSPID VALVE MV Area (PHT): 2.71 cm     TR Peak grad:   21.3 mmHg MV Decel Time: 280 msec     TR Vmax:        231.00 cm/s MV E velocity: 119.00 cm/s MV A velocity: 104.00 cm/s  SHUNTS MV E/A ratio:  1.14         Systemic VTI:  0.31 m                             Systemic Diam: 1.80 cm Skeet Latch md Electronically signed by Skeet Latch md Signature Date/Time: 06/13/2020/12:43:41 PM    Final        TIMI Risk Score for Unstable Angina or Non-ST Elevation MI:   The patient's TIMI risk score is 5, which indicates a 26% risk of all cause mortality, new or recurrent myocardial infarction or need for urgent revascularization in the next 14 days.      Assessment and  Plan:   NSTEMI troponins , echo 06/13/20 EF  55-60% no WMA, mild LVH, grade 2 DD, medical therapy recommended with CKD-Crt 4.49-chest pain this am on IV heparin and IV NTG. Prefers not to have cath with until she is on dialysis. Scheduled to see vascular surgeon on Thurs for fistula. Also on amlodipine 10 mg, plavix 75 mg daily, metoprolol 12.5 mg bid started yest. Continue to monitor on current meds. BP runs low normal at home. Can try to add Imdur once off IV NTG.  CADstatus post CABG in 2005, stent SVG to RCA and Cutting Balloon to the proximal circumflex 05/2017, PTCA to the proximal circumflex stage DES to the SVG to RCA and DES LIMA to the LAD 03/2018  CKD stage IV -for fistula for impending dialysis  HLD LDL 67 on lipitor  Recent pneumonia/bronchitis on Zofran and deltasone          For questions or updates, please contact [place] Please consult [email or web address] for contact info under    Signed, Ermalinda Barrios, PA-C  06/14/2020 10:26 AM  Attending note Patient seen and discussed with PA Ahmed Prima, I agree with her documentation. 64 yo female history of CAD with CABG in 2005 and numerous PCIs since that time, carotid stenosis with prior right CEA, CKD, HTN, HL, DM2 admitted with chest pain  NG improved on NG gtt in ER   K 5.3 Cr 4.77 BUN 75 WBC 12.4 Hgb 11.5 Plt 195 TSH 0.770 LDL 67 hstrop 20--->39-->160-->141--> EKG SR, chronic inferior and lateral precordial ST depressions CXR no acute process COVID neg  Echo: LVE 55-60%, no WMAs, grade II DDx  Admitted with chest pain, mild trop elevation peak to 160 trending down. EKG with chronic ST/T changes, no acute findings. Echo LVEF 55-60% with no WMAs. With advanced kidney disease she has been managed medically and not referred for cath, which I agree with.   Continue medical thearpy with ASA 81, plavix 75mg  (on at home), atorva 80, hep gtt, lopressor 12.mg bid. No ACE/ARB due to renal function. Would plan for 48 hours of  heparin (until tomorrow morning). Wean nitro drip   AKI on CKD, Cr up to 4.49, management per primary team. Avoid nephrotoxic meds.  Carlyle Dolly MD

## 2020-06-15 ENCOUNTER — Other Ambulatory Visit: Payer: Self-pay

## 2020-06-15 LAB — BASIC METABOLIC PANEL
Anion gap: 9 (ref 5–15)
BUN: 89 mg/dL — ABNORMAL HIGH (ref 8–23)
CO2: 21 mmol/L — ABNORMAL LOW (ref 22–32)
Calcium: 8.1 mg/dL — ABNORMAL LOW (ref 8.9–10.3)
Chloride: 104 mmol/L (ref 98–111)
Creatinine, Ser: 4.64 mg/dL — ABNORMAL HIGH (ref 0.44–1.00)
GFR calc Af Amer: 11 mL/min — ABNORMAL LOW (ref >60)
GFR calc non Af Amer: 10 mL/min — ABNORMAL LOW (ref >60)
Glucose, Bld: 254 mg/dL — ABNORMAL HIGH (ref 70–99)
Potassium: 5.3 mmol/L — ABNORMAL HIGH (ref 3.5–5.1)
Sodium: 134 mmol/L — ABNORMAL LOW (ref 135–145)

## 2020-06-15 LAB — RENAL FUNCTION PANEL
Albumin: 2.8 g/dL — ABNORMAL LOW (ref 3.5–5.0)
Anion gap: 8 (ref 5–15)
BUN: 90 mg/dL — ABNORMAL HIGH (ref 8–23)
CO2: 21 mmol/L — ABNORMAL LOW (ref 22–32)
Calcium: 8.1 mg/dL — ABNORMAL LOW (ref 8.9–10.3)
Chloride: 105 mmol/L (ref 98–111)
Creatinine, Ser: 4.6 mg/dL — ABNORMAL HIGH (ref 0.44–1.00)
GFR calc Af Amer: 11 mL/min — ABNORMAL LOW (ref >60)
GFR calc non Af Amer: 10 mL/min — ABNORMAL LOW (ref >60)
Glucose, Bld: 254 mg/dL — ABNORMAL HIGH (ref 70–99)
Phosphorus: 4.8 mg/dL — ABNORMAL HIGH (ref 2.5–4.6)
Potassium: 5.3 mmol/L — ABNORMAL HIGH (ref 3.5–5.1)
Sodium: 134 mmol/L — ABNORMAL LOW (ref 135–145)

## 2020-06-15 LAB — GLUCOSE, CAPILLARY
Glucose-Capillary: 232 mg/dL — ABNORMAL HIGH (ref 70–99)
Glucose-Capillary: 238 mg/dL — ABNORMAL HIGH (ref 70–99)
Glucose-Capillary: 128 mg/dL — ABNORMAL HIGH (ref 70–99)
Glucose-Capillary: 132 mg/dL — ABNORMAL HIGH (ref 70–99)

## 2020-06-15 LAB — CBC
HCT: 26.7 % — ABNORMAL LOW (ref 36.0–46.0)
Hemoglobin: 8.4 g/dL — ABNORMAL LOW (ref 12.0–15.0)
MCH: 29.5 pg (ref 26.0–34.0)
MCHC: 31.5 g/dL (ref 30.0–36.0)
MCV: 93.7 fL (ref 80.0–100.0)
Platelets: 120 10*3/uL — ABNORMAL LOW (ref 150–400)
RBC: 2.85 MIL/uL — ABNORMAL LOW (ref 3.87–5.11)
RDW: 14.6 % (ref 11.5–15.5)
WBC: 9 10*3/uL (ref 4.0–10.5)
nRBC: 0 % (ref 0.0–0.2)

## 2020-06-15 LAB — IRON AND TIBC
Iron: 146 ug/dL (ref 28–170)
Saturation Ratios: 61 % — ABNORMAL HIGH (ref 10.4–31.8)
TIBC: 239 ug/dL — ABNORMAL LOW (ref 250–450)
UIBC: 93 ug/dL

## 2020-06-15 LAB — FERRITIN: Ferritin: 4 ng/mL — ABNORMAL LOW (ref 11–307)

## 2020-06-15 LAB — HEPARIN LEVEL (UNFRACTIONATED): Heparin Unfractionated: 0.42 IU/mL (ref 0.30–0.70)

## 2020-06-15 LAB — MAGNESIUM: Magnesium: 2.1 mg/dL (ref 1.7–2.4)

## 2020-06-15 MED ORDER — HEPARIN SODIUM (PORCINE) 5000 UNIT/ML IJ SOLN
5000.0000 [IU] | Freq: Three times a day (TID) | INTRAMUSCULAR | Status: DC
  Administered 2020-06-15 – 2020-06-16 (×4): 5000 [IU] via SUBCUTANEOUS
  Filled 2020-06-15 (×5): qty 1

## 2020-06-15 MED ORDER — RANOLAZINE ER 500 MG PO TB12
500.0000 mg | ORAL_TABLET | Freq: Two times a day (BID) | ORAL | Status: DC
  Administered 2020-06-15 – 2020-06-17 (×5): 500 mg via ORAL
  Filled 2020-06-15 (×5): qty 1

## 2020-06-15 MED ORDER — INSULIN DETEMIR 100 UNIT/ML ~~LOC~~ SOLN
20.0000 [IU] | Freq: Every day | SUBCUTANEOUS | Status: DC
  Administered 2020-06-15 – 2020-06-16 (×2): 20 [IU] via SUBCUTANEOUS
  Filled 2020-06-15 (×3): qty 0.2

## 2020-06-15 MED ORDER — ALUM & MAG HYDROXIDE-SIMETH 200-200-20 MG/5ML PO SUSP
30.0000 mL | Freq: Once | ORAL | Status: AC
  Administered 2020-06-15: 30 mL via ORAL
  Filled 2020-06-15: qty 30

## 2020-06-15 MED ORDER — ISOSORBIDE MONONITRATE ER 30 MG PO TB24: 30.0000 mg | ORAL_TABLET | Freq: Every day | ORAL | Status: DC

## 2020-06-15 MED ORDER — IPRATROPIUM-ALBUTEROL 0.5-2.5 (3) MG/3ML IN SOLN
3.0000 mL | Freq: Two times a day (BID) | RESPIRATORY_TRACT | Status: DC
  Administered 2020-06-15 – 2020-06-17 (×4): 3 mL via RESPIRATORY_TRACT
  Filled 2020-06-15 (×4): qty 3

## 2020-06-15 NOTE — Progress Notes (Addendum)
Progress Note  Patient Name: Donna Howe Date of Encounter: 06/15/2020  [facility] Cardiologist: Rozann Lesches, MD      Subjective   Episode of chest tightness into left arm last night and this am. Seems to occur after eating and getting up to use BR. Last night relieved with protonix, coke. Lots of belching. She's not sure if heart or GI  Inpatient Medications    Scheduled Meds: . aspirin EC  81 mg Oral Daily  . atorvastatin  80 mg Oral QPM  . budesonide (PULMICORT) nebulizer solution  0.5 mg Nebulization BID  . Chlorhexidine Gluconate Cloth  6 each Topical Daily  . cilostazol  100 mg Oral BID AC  . clopidogrel  75 mg Oral Daily  . famotidine  10 mg Oral Daily  . ferrous sulfate  325 mg Oral Q breakfast  . gabapentin  400 mg Oral BID  . insulin aspart  0-20 Units Subcutaneous TID WC  . insulin aspart  0-5 Units Subcutaneous QHS  . insulin detemir  15 Units Subcutaneous QHS  . ipratropium-albuterol  3 mL Nebulization BID  . levothyroxine  125 mcg Oral Daily  . metoprolol tartrate  12.5 mg Oral BID  . pantoprazole  40 mg Oral Daily  . sodium bicarbonate  650 mg Oral BID   Continuous Infusions: . heparin 1,250 Units/hr (06/14/20 2015)  . nitroGLYCERIN 5 mcg/min (06/14/20 1730)   PRN Meds: acetaminophen, albuterol, ALPRAZolam, alum & mag hydroxide-simeth, guaiFENesin-dextromethorphan, ondansetron (ZOFRAN) IV   Vital Signs    Vitals:   06/15/20 0200 06/15/20 0300 06/15/20 0400 06/15/20 0500  BP: (!) 99/59 97/64 (!) 62/26   Pulse: 62 (!) 58 (!) 53   Resp: 14 14 14    Temp:   98.5 F (36.9 C)   TempSrc:      SpO2: (!) 88% 90% (!) 88%   Weight:    107.4 kg  Height:        Intake/Output Summary (Last 24 hours) at 06/15/2020 0928 Last data filed at 06/15/2020 0400 Gross per 24 hour  Intake 1118.63 ml  Output --  Net 1118.63 ml   Last 3 Weights 06/15/2020 06/13/2020 06/12/2020  Weight (lbs) 236 lb 12.4 oz 224 lb 10.4 oz 218 lb  Weight (kg) 107.4 kg 101.9  kg 98.884 kg      Telemetry    NSR - Personally Reviewed  ECG       Physical Exam    GEN: No acute distress.   Neck: No JVD Cardiac: ERD,4/0 systolic murmur LSB Respiratory: diffuse wheezing and decreased breath sounds throughout GI: Soft, nontender, non-distended  MS: No edema; No deformity. Neuro:  Nonfocal  Psych: Normal affect   Labs    High Sensitivity Troponin:   Recent Labs  Lab 06/12/20 2118 06/13/20 0023 06/13/20 1452 06/13/20 1642  TROPONINIHS 20* 39* 160* 141*      Chemistry Recent Labs  Lab 06/14/20 0717 06/14/20 0841 06/15/20 0544  NA 137 137 134*  134*  K 3.8 4.5 5.3*  5.3*  CL 98 105 105  104  CO2 32 22 21*  21*  GLUCOSE 176* 181* 254*  254*  BUN 19 81* 90*  89*  CREATININE 0.58 4.49* 4.60*  4.64*  CALCIUM 7.7* 8.1* 8.1*  8.1*  ALBUMIN 2.0* 2.9* 2.8*  GFRNONAA >60 10* 10*  10*  GFRAA >60 11* 11*  11*  ANIONGAP 7 10 8  9      Hematology Recent Labs  Lab 06/13/20 0601 06/14/20  5456 06/15/20 0544  WBC 11.0* 9.9 9.0  RBC 3.56* 3.22* 2.85*  HGB 10.5* 9.8* 8.4*  HCT 33.1* 30.9* 26.7*  MCV 93.0 96.0 93.7  MCH 29.5 30.4 29.5  MCHC 31.7 31.7 31.5  RDW 14.6 14.8 14.6  PLT 165 144* 120*    BNPNo results for input(s): BNP, PROBNP in the last 168 hours.   DDimer No results for input(s): DDIMER in the last 168 hours.   Radiology    No results found.  Cardiac Studies    Carotid Dopplers 12/16/19 Summary:  Right Carotid: Velocities in the right ICA are consistent with a 60-79%                 stenosis. The ECA appears >50% stenosed.   Left Carotid: Velocities in the left ICA are consistent with a 1-39%  stenosis.   Vertebrals: Bilateral vertebral arteries demonstrate antegrade flow.   *See table(s) above for measurements and observations.      Electronically signed by Monica Martinez MD on 12/16/2019 at 5:12:06 PM.        Echocardiogram 10/02/2019:  1. Left ventricular ejection fraction, by visual estimation,  is 60 to 65%. The left ventricle has normal function. There is mildly increased left ventricular hypertrophy.  2. Left ventricular diastolic parameters are indeterminate.  3. The left ventricle has no regional wall motion abnormalities.  4. Global right ventricle has normal systolic function.The right ventricular size is normal. No increase in right ventricular wall thickness.  5. Left atrial size was normal.  6. Right atrial size was normal.  7. The mitral valve is normal in structure. Trivial mitral valve regurgitation. No evidence of mitral stenosis.  8. The tricuspid valve is not well visualized.  9. The aortic valve is normal in structure. Aortic valve regurgitation is not visualized. No evidence of aortic valve sclerosis or stenosis. 10. The pulmonic valve was not well visualized. Pulmonic valve regurgitation is not visualized.     LHC 04/09/2018  Non-stenotic Ost LM to LM lesion previously treated.  Ost LAD to Prox LAD lesion is 80% stenosed.  Non-stenotic Ost Cx to Mid Cx lesion previously treated.  1st Mrg-1 lesion is 70% stenosed.  Ost 1st Mrg lesion is 80% stenosed.  1st Mrg-2 lesion is 10% stenosed.  Prox Cx to Mid Cx lesion is 50% stenosed.  Patent stent in the ostium of the SVG to the RCA  Mid RCA lesion is 90% stenosed.  A drug-eluting stent was successfully placed using a STENT SIERRA 2.50 X 15 MM, through the SVG to RCA graft.  Post intervention, there is a 0% residual stenosis.  Origin lesion is 70% stenosed of the LIMA to LAD.  A drug-eluting stent was successfully placed using a STENT SYNERGY DES 3X12, postdilated to 3.25 mm.  Post intervention, there is a 0% residual stenosis.     Recommend uninterrupted dual antiplatelet therapy with Aspirin 81mg  daily and Ticagrelor 90mg  twice daily for a minimum of 12 months (ACS - Class I recommendation).    Continue aggressive secondary prevention.     LHC 04/08/2018  Non-stenotic Ost LM to LM lesion previously  treated.  Ost LAD to Prox LAD lesion is 80% stenosed. LIMA to LAD is patent with a proximal 60% lesion.  Non-stenotic Ost Cx to Mid Cx lesion previously treated.  Prox Cx to Mid Cx lesion is 50% stenosed.  Balloon angioplasty was performed.  1st Mrg-1 lesion is 70% stenosed.  Ost 1st Mrg lesion is 80% stenosed.  1st  Mrg-2 lesion is 10% stenosed.  Origin to Prox Graft lesion is 100% stenosed. SVG to OM is occluded.  Previously placed Origin drug eluting stent is widely patent.  Balloon angioplasty was performed.  Mid RCA lesion is 90% stenosed. THis is past the insertion of the graft.  LV end diastolic pressure is mildly elevated.  There is no aortic valve stenosis.  Origin lesion is 60% stenosed.       Recommend dual antiplatelet therapy with Aspirin 81mg  daily and Clopidogrel 75mg  daily long-term (beyond 12 months) because of extensive CAD, and multiple stents..    Will look into repeat PCI attempt tomorrow if Cr is ok.  Could consider redo CABG eval as well given extensive circ disease and now two lesions in the SVG to RCA in the last 12 months, proximal LIMA lesion.  Will discuss with interventional colleagues.       Patient Profile     64 y.o. female with history of CAD, CKD admitted with NSTEMI-treat medically with Crt 4.64  Assessment & Plan    NSTEMI troponins 160 peak , echo 06/13/20 EF 55-60% no WMA, mild LVH, grade 2 DD, medical therapy recommended with CKD-Crt 4.64-chest pain last night and this am on IV heparin and IV NTG. Medical therapy for now.Scheduled to see vascular surgeon on Thurs for fistula. Also on (amlodipine 10 mg on hold), plavix 75 mg daily, metoprolol 12.5 mg bid-just started.. Chest pain mostly after meals and also having a lot of GI symptoms now on protonix. Try to wean NTG  limited by low BP. Amlodipine on hold and can't start Imdur yet with low bp.   CAD status post CABG in 2005, stent SVG to RCA and Cutting Balloon to the proximal  circumflex 05/2017, PTCA to the proximal circumflex stage DES to the SVG to RCA and DES LIMA to the LAD 03/2018   CKD stage IV -for fistula for impending dialysis   HLD LDL 67 on lipitor   Recent pneumonia/bronchitis on Zofran and deltasone     For questions or updates, please contact [place] Please consult [email or web address] for contact info under        Signed, Ermalinda Barrios, PA-C  06/15/2020, 9:28 AM    Attending note Patient seen and discussed with PA Bonnell Public, I agree with her documentation. Admitted with chest pain, mild trop elevation peak to 160 trending down. EKG with chronic ST/T changes, no acute findings. Echo LVEF 55-60% with no WMAs. With advanced kidney disease she has been managed medically and not referred for cath.   Completed 48 hours of heparin, will d/c particularly with Hgb drop. Defer Hgb drop workup to primary team. Continue medical therapy with ASA, atorva 80, plavix 75 (had been on at home), lopressor 12.5mg  bid. Weaning nitro drip. No ACE/ARB due to renal dysfunction. Soft bp's at times, hold on CCB/nitrate as antianginal. If soft bp's persist consider ranexa. From renal notes want to maximize bp's and renal perfusion, will go ahead and start ranexa. Some atypical features of pain yesterday, better with coke and belching, may be a GI component, trial of GI cocktail.    AKI on CKD, seen by renal and continue to monitor  Anemia per primary team, drop in Hgb. Has completed 48 hours of heparin, we are stopping. SHe denies any overt bleeding  Carlyle Dolly MD

## 2020-06-15 NOTE — Consult Note (Signed)
Nephrology Consult   Requesting provider: Orson Eva Service requesting consult: Hospitalist Reason for consult: AKI on CKD   Assessment/Recommendations: Donna Howe is a/an 64 y.o. female with a past medical history DM2, SBO, CAD s/p CABG and PCIs, PAD, HTN, HLD, CKD who present w/ chest pain and AKI  Non-Oliguric AKI on CKD IV/V: Baseline CKD related to hypertension, diabetes, arterionephrosclerosis that has been progressive in the outpatient setting with plans to set up for dialysis soon in the outpatient setting.  AKI here possibly related to hemodynamic changes.  She does not appear dehydrated but does have intermittent low blood pressures likely related to nitroglycerin as well as her additional blood pressure medications.  Also likely some advancement of her disease.  BUN much higher than her normal baseline which likely speaks to poor renal perfusion.  No obvious uremic symptoms on exam. -Stop hydralazine and hold parameters for metoprolol when systolic blood pressures less than 100 -Wean nitroglycerin as able -Continue to monitor for symptoms of uremia; no indications for dialysis at this time -Continue to monitor daily Cr, Dose meds for GFR -Monitor Daily I/Os, Daily weight  -Maintain MAP>65 for optimal renal perfusion as able.  -Avoid nephrotoxic medications including NSAIDs and Vanc/Zosyn combo -Would consider renal ultrasound if creatinine continued to rise  Volume Status: Appears mildly hypervolemic on exam with lower extremity edema.  Would allow patient to drink to thirst.  No need for excessive hydration  Hypertension: History of such with low blood pressures and management as above.  Anemia due to CKD: Hemoglobin 8.4.  Obtain iron studies and consider treatment  Uncontrolled Diabetes Mellitus Type 2 with Hyperglycemia: mgmt per primary  Hyperkalemia: Potassium 5.3 likely related to renal insufficiency.  No need for significant treatment at this time.  Continue to  monitor  Unstable angina: Cardiology following.  Persistent chest pain.  Continue management per primary and cardiology   Recommendations conveyed to primary service.    [facility] 06/15/2020 9:18 AM   _____________________________________________________________________________________ CC: Chest pain  History of Present Illness: Donna Howe is a/an 64 y.o. female with a past medical history of  DM2, SBO, CAD s/p CABG and PCIs, PAD, HTN, HLD, CKD who present w/ chest pain  Patient presented on 06/12/2020 with chest pain to the emergency department.  She states that that the chest pain was located in the center of her chest and radiated to the left side including her breast and arm.  She describes it as an aching pain somewhat like a toothache in her chest.  She had been having the pain for couple days and came to the emergency department because it did not improve.  She would take nitro intermittently which would help with the pain but it did not go away completely.  Her chest pain seemed to worsen with exertion.  She also had shortness of breath but she feels like this was from bronchitis.  She feels like the symptoms she has are similar to prior heart attacks.  She did feel diaphoretic intermittently and also had some dizziness.  She has been compliant with her medications.  She did not notice any change to her urine output.  She has some edema in her lower extremities at baseline and this is not changed.  She denied fevers or chills but does have a cough that is intermittently productive.  She was being treated for a COPD exacerbation outpatient.  In the emergency department she was started on a heparin and  nitroglycerin drip which helped with her symptoms somewhat.  Since admission she has had fluctuation in her symptoms with overall improvement however today she states that her chest pain is persistent.  She is able to eat some without a significant  issue.  Creatinine on arrival was 4.7 with a presumed baseline of around 3.5-4.  Her creatinine is remained steady around 4.5-4.7 since arrival.   Medications:  Current Facility-Administered Medications  Medication Dose Route Frequency Provider Last Rate Last Admin  . acetaminophen (TYLENOL) tablet 650 mg  650 mg Oral Q4H PRN Zierle-Ghosh, Asia B, DO      . albuterol (VENTOLIN HFA) 108 (90 Base) MCG/ACT inhaler 2 puff  2 puff Inhalation Q4H PRN Zierle-Ghosh, Asia B, DO      . ALPRAZolam (XANAX) tablet 0.25 mg  0.25 mg Oral BID PRN Zierle-Ghosh, Asia B, DO   0.25 mg at 06/14/20 2015  . alum & mag hydroxide-simeth (MAALOX/MYLANTA) 200-200-20 MG/5ML suspension 30 mL  30 mL Oral Q4H PRN Tat, David, MD      . aspirin EC tablet 81 mg  81 mg Oral Daily Zierle-Ghosh, Asia B, DO   81 mg at 06/14/20 1016  . atorvastatin (LIPITOR) tablet 80 mg  80 mg Oral QPM Zierle-Ghosh, Asia B, DO   80 mg at 06/14/20 1834  . budesonide (PULMICORT) nebulizer solution 0.5 mg  0.5 mg Nebulization BID Tat, David, MD   0.5 mg at 06/15/20 0806  . Chlorhexidine Gluconate Cloth 2 % PADS 6 each  6 each Topical Daily Tat, David, MD   6 each at 06/14/20 1017  . cilostazol (PLETAL) tablet 100 mg  100 mg Oral BID AC Zierle-Ghosh, Asia B, DO   100 mg at 06/15/20 0823  . clopidogrel (PLAVIX) tablet 75 mg  75 mg Oral Daily Zierle-Ghosh, Asia B, DO   75 mg at 06/14/20 1016  . famotidine (PEPCID) tablet 10 mg  10 mg Oral Daily Tat, David, MD   10 mg at 06/14/20 2319  . ferrous sulfate tablet 325 mg  325 mg Oral Q breakfast Zierle-Ghosh, Asia B, DO   325 mg at 06/15/20 0823  . gabapentin (NEURONTIN) capsule 400 mg  400 mg Oral BID Zierle-Ghosh, Asia B, DO   400 mg at 06/14/20 2105  . guaiFENesin-dextromethorphan (ROBITUSSIN DM) 100-10 MG/5ML syrup 5 mL  5 mL Oral Q4H PRN Tat, David, MD      . heparin ADULT infusion 100 units/mL (25000 units/259mL sodium chloride 0.45%)  1,250 Units/hr Intravenous Continuous Coffee, Donna Christen, RPH 12.5 mL/hr  at 06/14/20 2015 1,250 Units/hr at 06/14/20 2015  . insulin aspart (novoLOG) injection 0-20 Units  0-20 Units Subcutaneous TID WC Zierle-Ghosh, Asia B, DO   7 Units at 06/15/20 0823  . insulin aspart (novoLOG) injection 0-5 Units  0-5 Units Subcutaneous QHS Zierle-Ghosh, Asia B, DO   4 Units at 06/14/20 2233  . insulin detemir (LEVEMIR) injection 15 Units  15 Units Subcutaneous QHS Zierle-Ghosh, Asia B, DO   15 Units at 06/14/20 2232  . ipratropium-albuterol (DUONEB) 0.5-2.5 (3) MG/3ML nebulizer solution 3 mL  3 mL Nebulization BID Tat, David, MD      . levothyroxine (SYNTHROID) tablet 125 mcg  125 mcg Oral Daily Zierle-Ghosh, Asia B, DO   125 mcg at 06/15/20 0552  . metoprolol tartrate (LOPRESSOR) tablet 12.5 mg  12.5 mg Oral BID Reesa Chew, MD   12.5 mg at 06/14/20 2104  . nitroGLYCERIN 50 mg in dextrose 5 % 250 mL (0.2 mg/mL) infusion  5-200 mcg/min Intravenous Continuous Zierle-Ghosh, Asia B, DO 1.5 mL/hr at 06/14/20 1730 5 mcg/min at 06/14/20 1730  . ondansetron (ZOFRAN) injection 4 mg  4 mg Intravenous Q6H PRN Zierle-Ghosh, Asia B, DO   4 mg at 06/13/20 2239  . pantoprazole (PROTONIX) EC tablet 40 mg  40 mg Oral Daily Lang Snow, FNP   40 mg at 06/14/20 2108  . sodium bicarbonate tablet 650 mg  650 mg Oral BID Zierle-Ghosh, Asia B, DO   650 mg at 06/14/20 2104     ALLERGIES Penicillins  MEDICAL HISTORY Past Medical History:  Diagnosis Date  . Anemia   . Asthma   . CAD (coronary artery disease)    Multivessel s/p CABG 2005, numerous PCIs since that time  . Carotid artery disease (HCC)    R CEA  . Chronic kidney disease (CKD), stage III (moderate)   . Essential hypertension   . Gout   . Hyperlipidemia   . Hypothyroidism   . Myocardial infarction ([place])   . PAD (peripheral artery disease) ([place])    Dr. Kellie Simmering  . S/P angioplasty with stent- DES to [facility] and to LIMA to LAD with DES 04/09/18.   04/10/2018  . SBO (small bowel obstruction) ([place]) 2011   Status post lysis of  adhesions & hernia repair  . Sinus bradycardia   . Thrombocytopenia, unspecified ([place])   . Type 2 diabetes mellitus ([place])   . Umbilical hernia      SOCIAL HISTORY Social History   Socioeconomic History  . Marital status: Divorced    Spouse name: Not on file  . Number of children: Not on file  . Years of education: Not on file  . Highest education level: Not on file  Occupational History  . Occupation: Disabled  Tobacco Use  . Smoking status: Former Smoker    Packs/day: 1.00    Years: 20.00    Pack years: 20.00    Types: Cigarettes    Quit date: 12/10/2012    Years since quitting: 7.5  . Smokeless tobacco: Never Used  Vaping Use  . Vaping Use: Never used  Substance and Sexual Activity  . Alcohol use: No    Alcohol/week: 0.0 standard drinks  . Drug use: No  . Sexual activity: Not Currently  Other Topics Concern  . Not on file  Social History Narrative   Lives with mother.   Social Determinants of Health   Financial Resource Strain:   . Difficulty of Paying Living Expenses: Not on file  Food Insecurity:   . Worried About Charity fundraiser in the Last Year: Not on file  . Ran Out of Food in the Last Year: Not on file  Transportation Needs:   . Lack of Transportation (Medical): Not on file  . Lack of Transportation (Non-Medical): Not on file  Physical Activity:   . Days of Exercise per Week: Not on file  . Minutes of Exercise per Session: Not on file  Stress:   . Feeling of Stress : Not on file  Social Connections:   . Frequency of Communication with Friends and Family: Not on file  . Frequency of Social Gatherings with Friends and Family: Not on file  . Attends Religious Services: Not on file  . Active Member of Clubs or Organizations: Not on file  . Attends Archivist Meetings: Not on file  . Marital Status: Not on file  Intimate Partner Violence:   . Fear of Current or Ex-Partner: Not on file  .  Emotionally Abused: Not on file  . Physically  Abused: Not on file  . Sexually Abused: Not on file     FAMILY HISTORY Family History  Problem Relation Age of Onset  . Diabetes Mother   . Heart disease Mother        before age 98  . Hyperlipidemia Mother   . Hypertension Mother   . Thyroid disease Father   . Hypertension Father   . AAA (abdominal aortic aneurysm) Father   . Heart disease Brother        before age 4  . Hypertension Brother   . Hyperlipidemia Son   . Hypertension Son       Review of Systems: 12 systems reviewed Otherwise as per HPI, all other systems reviewed and negative  Physical Exam: Vitals:   06/15/20 0300 06/15/20 0400  BP: 97/64 (!) 62/26  Pulse: (!) 58 (!) 53  Resp: 14 14  Temp:  98.5 F (36.9 C)  SpO2: 90% (!) 88%   No intake/output data recorded.  Intake/Output Summary (Last 24 hours) at 06/15/2020 9470 Last data filed at 06/15/2020 0400 Gross per 24 hour  Intake 1118.63 ml  Output --  Net 1118.63 ml   General: well-appearing, no acute distress HEENT: anicteric sclera, oropharynx clear without lesions CV: bradycardia, 2/6 systolic murmur, 1+ pitting edema in the BLE Lungs: Coarse bilateral breath sounds with expiratory wheezing and intermittent crackles in the bilateral lungs, normal work of breathing Abd: soft, non-tender, non-distended Skin: no visible lesions or rashes Psych: alert, engaged, appropriate mood and affect Musculoskeletal: no obvious deformities Neuro: normal speech, no gross focal deficits   Test Results Reviewed Lab Results  Component Value Date   NA 134 (L) 06/15/2020   NA 134 (L) 06/15/2020   K 5.3 (H) 06/15/2020   K 5.3 (H) 06/15/2020   CL 105 06/15/2020   CL 104 06/15/2020   CO2 21 (L) 06/15/2020   CO2 21 (L) 06/15/2020   BUN 90 (H) 06/15/2020   BUN 89 (H) 06/15/2020   CREATININE 4.60 (H) 06/15/2020   CREATININE 4.64 (H) 06/15/2020   CALCIUM 8.1 (L) 06/15/2020   CALCIUM 8.1 (L) 06/15/2020   ALBUMIN 2.8 (L) 06/15/2020   PHOS 4.8 (H) 06/15/2020      I have reviewed all relevant outside healthcare records related to the patient's current hospitalization

## 2020-06-15 NOTE — Progress Notes (Addendum)
PROGRESS NOTE  Donna Howe URK:[id number] DOB: 1956/03/05 DOA: 06/12/2020 PCP: [facility], [facility]  Brief History:  64 year old female with a history of diabetes mellitus type 2, hyperlipidemia, hypertension, CKD stage V, coronary artery diseasewith NSTEMI and DES, right carotid stenosis, peripheral arterial disease, and obesity presenting with chest pain. Patient states that she has had intermittent chest pain since 06/10/2020. She states that she has been taking approximately 2 nitro on a daily basis that relieved her chest discomfort. She states that her chest discomfort usually last approximately 15 to 20 minutes. It is worse with exertion. On 06/12/2020, the patient took a second nitroglycerin without much relief of her chest discomfort. As result, EMS was activated. The patient states that the chest discomfort radiated down to her left arm. It feels like a throbbing sensation. She has some associated shortness of breath. She denies any fevers, chills, headache, nausea, vomiting, diarrhea, abdominal pain. The patient went to see her PCP on 06/09/2020. She was started on levofloxacin 750 mg daily and prednisone as well as Tessalon Perles for bronchitis. She denies any NSAIDs. In the emergency department, the patient was afebrile hemodynamically stable. Oxygen saturation was 91% on room air. She was placed on 2 L nasal cannula. BMP showed potassium 4.7, serum creatinine 4.77. WBC 12.4, hemoglobin 9.5, platelets 195,000. Chest x-ray showed right basal atelectasis and increased interstitial markings. The patient was started on IV heparin and IV nitroglycerin. Troponins 20>>>39>>160>>141  Assessment/Plan: Unstable Angina/NSTEMI -Presently pain-free -wean IV nitroglycerin -Finished IV heparin x 48 hours in am 9/28 -Echocardiogram--EF 55-60%, no WMA, grade 2 DD -Cardiology consult appreciated -04/09/2018 heart catheterization--ost LAD-prox LAD 80%, ost 1st  margina 80%, mid RCA 90%, LIMA-LAD 70%; DES x 2 placed -Continue aspirin and Plavix -Continue statin -Personally reviewed EKG--sinus rhythm, ST depression V4-V6 -LDL 67  Acute respiratory failure with hypoxia -initially upper 80s on RA -stable on 2L>>RA -due to COPD and acute bronchitis  Acute bronchitis -continue duo nebs -continue pulmicort -COVID-19 negative -Check viral respiratory panel -Check procalcitonin <0.10 -Personally reviewed chest x-ray--RLLatelectasis, increased interstitial markings  Acute on chronic renal failure--CKD stage V -Baseline creatinine 3.1-3.4 -Patient had recent blood work at [facility]8/20/2021 with serum creatinine 4.01 -Likely secondary to hemodynamic changes versus progression of underlying CKD -Gentle hydration--done--now euvolemic to hypervolemic -Patient follows Dr. Joelyn Oms in the outpatient setting -Continue bicarbonate -She has an appointment [facility] 06/17/2020 for what sounds to be venous mapping for fistula -am BMP -consult renal--appreciated  Hemoptysis -?vigorous cough with IV heparin -VQ scan  Diabetes mellitus type 2 with nephropathy -Reduced dose Levemir-->increase to 20 -NovoLog sliding scale -Hemoglobin A1c--6.2  Essential hypertension -discontinue hydralazine due to low BP -holding isosorbide due to lo BP -Started low-dose beta-blocker -holding amlodipine  Hypothyroidism -Continue Synthroid -am TSH  Hyperlipidemia -Continue statin  Peripheral arterial disease -Continue Pletal  Anemia of CKD -drop in Hgb likely due to dilution from IVF -FOBT -fluids now stopped -baseline Hgb 10-11 -iron saturation 61%, ferritin 4 -check B12 and folate  Hyperkalemia -observe without tx for now per renal       Status is: Inpatient  Remains inpatient appropriate because:Unsafe d/c plan and IV treatments appropriate due to intensity of illness or inability to take PO   Dispo: The patient is from:  Home  Anticipated d/c is to: Home  Anticipated d/c date is: 9/29  Patient currently is not medically stable to d/c.        Family Communication:  Aunt  updated at bedside  Consultants:  Cardiology; renal  Code Status:  FULL   DVT Prophylaxis:  IV Heparin>>[place]   Procedures: As Listed in Progress Note Above  Antibiotics: None   Total time spent 35 minutes.  Greater than 50% spent face to face counseling and coordinating care.    Subjective: Patient continues to have intermittent chest pain.  Has some dyspnea on exertion.  Denies f/c, n/v/d.  Had scant amount of hemoptysis this am  Objective: Vitals:   06/15/20 0400 06/15/20 0500 06/15/20 0824 06/15/20 1222  BP: (!) 62/26     Pulse: (!) 53     Resp: 14     Temp: 98.5 F (36.9 C)  (!) 97.4 F (36.3 C) 97.6 F (36.4 C)  TempSrc:   Oral Oral  SpO2: (!) 88%     Weight:  107.4 kg    Height:        Intake/Output Summary (Last 24 hours) at 06/15/2020 1449 Last data filed at 06/15/2020 0400 Gross per 24 hour  Intake 1118.63 ml  Output --  Net 1118.63 ml   Weight change: 5.5 kg Exam:   General:  Pt is alert, follows commands appropriately, not in acute distress  HEENT: No icterus, No thrush, No neck mass, [place]/AT  Cardiovascular: RRR, S1/S2, no rubs, no gallops  Respiratory: bibasilar rales. No wheeze  Abdomen: Soft/+BS, non tender, non distended, no guarding  Extremities: No edema, No lymphangitis, No petechiae, No rashes, no synovitis   Data Reviewed: I have personally reviewed following labs and imaging studies Basic Metabolic Panel: Recent Labs  Lab 06/12/20 2118 06/12/20 2118 06/13/20 0023 06/13/20 0601 06/14/20 0717 06/14/20 0841 06/15/20 0544  NA 137  --   --  138 137 137 134*  134*  K 5.3*   < > 4.7 4.7 3.8 4.5 5.3*  5.3*  CL 102  --   --  105 98 105 105  104  CO2 21*  --   --  22 32 22 21*  21*  GLUCOSE 175*  --   --  113* 176* 181*  254*  254*  BUN 75*  --   --  80* 19 81* 90*  89*  CREATININE 4.77*  --   --  4.64* 0.58 4.49* 4.60*  4.64*  CALCIUM 9.0  --   --  8.7* 7.7* 8.1* 8.1*  8.1*  MG  --   --   --   --   --   --  2.1  PHOS  --   --   --   --  2.3* 5.7* 4.8*   < > = values in this interval not displayed.   Liver Function Tests: Recent Labs  Lab 06/14/20 0717 06/14/20 0841 06/15/20 0544  ALBUMIN 2.0* 2.9* 2.8*   No results for input(s): LIPASE, AMYLASE in the last 168 hours. No results for input(s): AMMONIA in the last 168 hours. Coagulation Profile: No results for input(s): INR, PROTIME in the last 168 hours. CBC: Recent Labs  Lab 06/12/20 2118 06/13/20 0601 06/14/20 0841 06/15/20 0544  WBC 12.4* 11.0* 9.9 9.0  NEUTROABS  --   --  7.5  --   HGB 11.5* 10.5* 9.8* 8.4*  HCT 35.6* 33.1* 30.9* 26.7*  MCV 93.2 93.0 96.0 93.7  PLT 195 165 144* 120*   Cardiac Enzymes: No results for input(s): CKTOTAL, CKMB, CKMBINDEX, TROPONINI in the last 168 hours. BNP: Invalid input(s): POCBNP CBG: Recent Labs  Lab 06/14/20 1113 06/14/20 1655 06/14/20 2129 06/15/20  0803 06/15/20 1145  GLUCAP 200* 267* 321* 232* 238*   HbA1C: Recent Labs    06/13/20 0023  HGBA1C 6.2*   Urine analysis:    Component Value Date/Time   COLORURINE YELLOW 12/18/2019 1353   APPEARANCEUR HAZY (A) 12/18/2019 1353   LABSPEC 1.016 12/18/2019 1353   PHURINE 5.0 12/18/2019 1353   GLUCOSEU 50 (A) 12/18/2019 1353   HGBUR NEGATIVE 12/18/2019 1353   BILIRUBINUR NEGATIVE 12/18/2019 1353   KETONESUR NEGATIVE 12/18/2019 1353   PROTEINUR >=300 (A) 12/18/2019 1353   UROBILINOGEN 0.2 10/26/2013 2229   NITRITE NEGATIVE 12/18/2019 1353   LEUKOCYTESUR NEGATIVE 12/18/2019 1353   Sepsis Labs: @LABRCNTIP (procalcitonin:4,lacticidven:4) ) Recent Results (from the past 240 hour(s))  Respiratory Panel by RT PCR (Flu A&B, Covid) - Nasopharyngeal Swab     Status: None   Collection Time: 06/12/20 10:57 PM   Specimen: Nasopharyngeal  Swab  Result Value Ref Range Status   SARS Coronavirus 2 by RT PCR NEGATIVE NEGATIVE Final    Comment: (NOTE) SARS-CoV-2 target nucleic acids are NOT DETECTED.  The SARS-CoV-2 RNA is generally detectable in upper respiratoy specimens during the acute phase of infection. The lowest concentration of SARS-CoV-2 viral copies this assay can detect is 131 copies/mL. A negative result does not preclude SARS-Cov-2 infection and should not be used as the sole basis for treatment or other patient management decisions. A negative result may occur with  improper specimen collection/handling, submission of specimen other than nasopharyngeal swab, presence of viral mutation(s) within the areas targeted by this assay, and inadequate number of viral copies (<131 copies/mL). A negative result must be combined with clinical observations, patient history, and epidemiological information. The expected result is Negative.  Fact Sheet for Patients:  PinkCheek.be  Fact Sheet for Healthcare Providers:  GravelBags.it  This test is no t yet approved or cleared by the [place] FDA and  has been authorized for detection and/or diagnosis of SARS-CoV-2 by FDA under an Emergency Use Authorization (EUA). This EUA will remain  in effect (meaning this test can be used) for the duration of the COVID-19 declaration under Section 564(b)(1) of the Act, 21 U.S.C. section 360bbb-3(b)(1), unless the authorization is terminated or revoked sooner.     Influenza A by PCR NEGATIVE NEGATIVE Final   Influenza B by PCR NEGATIVE NEGATIVE Final    Comment: (NOTE) The Xpert Xpress SARS-CoV-2/FLU/RSV assay is intended as an aid in  the diagnosis of influenza from Nasopharyngeal swab specimens and  should not be used as a sole basis for treatment. Nasal washings and  aspirates are unacceptable for Xpert Xpress SARS-CoV-2/FLU/RSV  testing.  Fact Sheet for  Patients: PinkCheek.be  Fact Sheet for Healthcare Providers: GravelBags.it  This test is not yet approved or cleared by the [place] FDA and  has been authorized for detection and/or diagnosis of SARS-CoV-2 by  FDA under an Emergency Use Authorization (EUA). This EUA will remain  in effect (meaning this test can be used) for the duration of the  Covid-19 declaration under Section 564(b)(1) of the Act, 21  U.S.C. section 360bbb-3(b)(1), unless the authorization is  terminated or revoked. Performed at [facility], [address] [place], [place] [address]   MRSA PCR Screening     Status: None   Collection Time: 06/13/20  6:51 PM   Specimen: Nasopharyngeal  Result Value Ref Range Status   MRSA by PCR NEGATIVE NEGATIVE Final    Comment:        The GeneXpert MRSA Assay (FDA  approved for NASAL specimens only), is one component of a comprehensive MRSA colonization surveillance program. It is not intended to diagnose MRSA infection nor to guide or monitor treatment for MRSA infections. Performed at [facility], [address], [place], [place] [address]      Scheduled Meds: . aspirin EC  81 mg Oral Daily  . atorvastatin  80 mg Oral QPM  . budesonide (PULMICORT) nebulizer solution  0.5 mg Nebulization BID  . Chlorhexidine Gluconate Cloth  6 each Topical Daily  . cilostazol  100 mg Oral BID AC  . clopidogrel  75 mg Oral Daily  . famotidine  10 mg Oral Daily  . ferrous sulfate  325 mg Oral Q breakfast  . gabapentin  400 mg Oral BID  . heparin  5,000 Units Subcutaneous Q8H  . insulin aspart  0-20 Units Subcutaneous TID WC  . insulin aspart  0-5 Units Subcutaneous QHS  . insulin detemir  15 Units Subcutaneous QHS  . ipratropium-albuterol  3 mL Nebulization BID  . levothyroxine  125 mcg Oral Daily  . metoprolol tartrate  12.5 mg Oral BID  . pantoprazole  40 mg Oral Daily  . ranolazine  500 mg Oral BID  . sodium  bicarbonate  650 mg Oral BID   Continuous Infusions:  Procedures/Studies: DG Chest 2 View  Result Date: 06/12/2020 CLINICAL DATA:  Chest pain and fatigue throughout the day. EXAM: CHEST - 2 VIEW COMPARISON:  06/11/2020 FINDINGS: Cardiac enlargement. No vascular congestion, edema, or consolidation. No pleural effusions. No pneumothorax. Mediastinal contours appear intact. Postoperative changes in the mediastinum. No significant changes since prior study. IMPRESSION: Cardiac enlargement.  No active pulmonary disease. Electronically Signed   By: Lucienne Capers M.D.   On: 06/12/2020 21:40   DG Abd 1 View  Result Date: 06/13/2020 CLINICAL DATA:  Abdominal bloating EXAM: ABDOMEN - 1 VIEW COMPARISON:  Radiograph 01/09/2010, chest radiograph 06/12/2020 FINDINGS: No high-grade obstructive bowel gas pattern is seen. There is a moderate colonic stool burden. About limited evaluation for free air on this supine only radiograph. No Rigler sign or other secondary features. Cholecystectomy clips in the right upper quadrant. Vascular calcium noted in the upper abdomen. Prior sternotomy and CABG as well as vascular stenting of the coronaries seen in the lung bases with some mild atelectatic change. No acute or suspicious osseous abnormalities. Degenerative changes in the lumbar spine, hips and pelvis. IMPRESSION: 1. No high-grade obstructive bowel gas pattern. 2. Moderate colonic stool burden. 3. Prior cholecystectomy. 4. Bibasilar atelectasis. 5. Prior sternotomy and CABG. Electronically Signed   By: Lovena Le M.D.   On: 06/13/2020 02:30   ECHOCARDIOGRAM COMPLETE  Result Date: 06/13/2020    ECHOCARDIOGRAM REPORT   Patient Name:   SHINITA MAC Date of Exam: 06/13/2020 Medical Rec #:  [id number]        Height:       66.0 in Accession #:    [id number]       Weight:       218.0 lb Date of Birth:  [birth year]       BSA:          2.074 m Patient Age:    72 years         BP:           136/61 mmHg Patient Gender: F                 HR:           65  bpm. Exam Location:  [facility] Procedure: 2D Echo, Cardiac Doppler and Color Doppler Indications:    Chest pain  History:        Patient has prior history of Echocardiogram examinations, most                 recent 10/02/2019. CAD and Previous Myocardial Infarction, Prior                 CABG, PAD, Signs/Symptoms:Chest Pain; Risk Factors:Diabetes,                 Hypertension, Dyslipidemia and Current Smoker. CKD.  Sonographer:    Dustin Flock RDCS Referring Phys: [id number] ASIA B [place]  Sonographer Comments: Patient is morbidly obese. IMPRESSIONS  1. Left ventricular ejection fraction, by estimation, is 55 to 60%. The left ventricle has normal function. The left ventricle has no regional wall motion abnormalities. There is mild concentric left ventricular hypertrophy. Left ventricular diastolic parameters are consistent with Grade II diastolic dysfunction (pseudonormalization). Elevated left ventricular end-diastolic pressure.  2. Right ventricular systolic function is normal. The right ventricular size is normal. There is normal pulmonary artery systolic pressure.  3. Left atrial size was mildly dilated.  4. Right atrial size was mildly dilated.  5. The mitral valve is normal in structure. Trivial mitral valve regurgitation. No evidence of mitral stenosis.  6. The aortic valve is tricuspid. Aortic valve regurgitation is not visualized. No aortic stenosis is present.  7. The inferior vena cava is normal in size with greater than 50% respiratory variability, suggesting right atrial pressure of 3 mmHg. FINDINGS  Left Ventricle: Left ventricular ejection fraction, by estimation, is 55 to 60%. The left ventricle has normal function. The left ventricle has no regional wall motion abnormalities. The left ventricular internal cavity size was normal in size. There is  mild concentric left ventricular hypertrophy. Left ventricular diastolic parameters are consistent with Grade II  diastolic dysfunction (pseudonormalization). Elevated left ventricular end-diastolic pressure. Right Ventricle: The right ventricular size is normal. No increase in right ventricular wall thickness. Right ventricular systolic function is normal. There is normal pulmonary artery systolic pressure. The tricuspid regurgitant velocity is 2.31 m/s, and  with an assumed right atrial pressure of 3 mmHg, the estimated right ventricular systolic pressure is 79.0 mmHg. Left Atrium: Left atrial size was mildly dilated. Right Atrium: Right atrial size was mildly dilated. Pericardium: There is no evidence of pericardial effusion. Mitral Valve: The mitral valve is normal in structure. Trivial mitral valve regurgitation. No evidence of mitral valve stenosis. Tricuspid Valve: The tricuspid valve is normal in structure. Tricuspid valve regurgitation is trivial. No evidence of tricuspid stenosis. Aortic Valve: The aortic valve is tricuspid. Aortic valve regurgitation is not visualized. No aortic stenosis is present. Pulmonic Valve: The pulmonic valve was normal in structure. Pulmonic valve regurgitation is not visualized. No evidence of pulmonic stenosis. Aorta: The aortic root is normal in size and structure. Venous: The inferior vena cava is normal in size with greater than 50% respiratory variability, suggesting right atrial pressure of 3 mmHg. IAS/Shunts: No atrial level shunt detected by color flow Doppler.  LEFT VENTRICLE PLAX 2D LVIDd:         5.18 cm  Diastology LVIDs:         3.64 cm  LV e' medial:    6.09 cm/s LV PW:         1.31 cm  LV E/e' medial:  19.5 LV IVS:  1.31 cm  LV e' lateral:   7.29 cm/s LVOT diam:     1.80 cm  LV E/e' lateral: 16.3 LV SV:         79 LV SV Index:   38 LVOT Area:     2.54 cm  RIGHT VENTRICLE RV Basal diam:  3.50 cm RV S prime:     7.72 cm/s TAPSE (M-mode): 2.5 cm LEFT ATRIUM             Index       RIGHT ATRIUM           Index LA diam:        3.70 cm 1.78 cm/m  RA Area:     19.70 cm LA  Vol (A2C):   29.1 ml 14.03 ml/m RA Volume:   62.30 ml  30.03 ml/m LA Vol (A4C):   67.1 ml 32.35 ml/m LA Biplane Vol: 48.3 ml 23.28 ml/m  AORTIC VALVE LVOT Vmax:   129.00 cm/s LVOT Vmean:  92.200 cm/s LVOT VTI:    0.312 m  AORTA Ao Root diam: 3.30 cm MITRAL VALVE                TRICUSPID VALVE MV Area (PHT): 2.71 cm     TR Peak grad:   21.3 mmHg MV Decel Time: 280 msec     TR Vmax:        231.00 cm/s MV E velocity: 119.00 cm/s MV A velocity: 104.00 cm/s  SHUNTS MV E/A ratio:  1.14         Systemic VTI:  0.31 m                             Systemic Diam: 1.80 cm Skeet Latch md Electronically signed by Skeet Latch md Signature Date/Time: 06/13/2020/12:43:41 PM    Final     Orson Eva, DO  [facility]  If 7PM-7AM, please contact night-coverage [email or web address] Password [email or web address] 06/15/2020, 2:49 PM   LOS: 2 days

## 2020-06-16 ENCOUNTER — Inpatient Hospital Stay (HOSPITAL_COMMUNITY): Payer: Medicare HMO

## 2020-06-16 DIAGNOSIS — N185 Chronic kidney disease, stage 5: Secondary | ICD-10-CM

## 2020-06-16 DIAGNOSIS — I200 Unstable angina: Secondary | ICD-10-CM

## 2020-06-16 LAB — VITAMIN B12: Vitamin B-12: 199 pg/mL (ref 180–914)

## 2020-06-16 LAB — TSH: TSH: 2.851 u[IU]/mL (ref 0.350–4.500)

## 2020-06-16 LAB — RENAL FUNCTION PANEL
Albumin: 2.9 g/dL — ABNORMAL LOW (ref 3.5–5.0)
Anion gap: 7 (ref 5–15)
BUN: 92 mg/dL — ABNORMAL HIGH (ref 8–23)
CO2: 23 mmol/L (ref 22–32)
Calcium: 8.3 mg/dL — ABNORMAL LOW (ref 8.9–10.3)
Chloride: 106 mmol/L (ref 98–111)
Creatinine, Ser: 4.5 mg/dL — ABNORMAL HIGH (ref 0.44–1.00)
GFR calc Af Amer: 11 mL/min — ABNORMAL LOW (ref >60)
GFR calc non Af Amer: 10 mL/min — ABNORMAL LOW (ref >60)
Glucose, Bld: 125 mg/dL — ABNORMAL HIGH (ref 70–99)
Phosphorus: 4.1 mg/dL (ref 2.5–4.6)
Potassium: 5.4 mmol/L — ABNORMAL HIGH (ref 3.5–5.1)
Sodium: 136 mmol/L (ref 135–145)

## 2020-06-16 LAB — CBC
HCT: 28.8 % — ABNORMAL LOW (ref 36.0–46.0)
Hemoglobin: 8.9 g/dL — ABNORMAL LOW (ref 12.0–15.0)
MCH: 29.4 pg (ref 26.0–34.0)
MCHC: 30.9 g/dL (ref 30.0–36.0)
MCV: 95 fL (ref 80.0–100.0)
Platelets: 123 10*3/uL — ABNORMAL LOW (ref 150–400)
RBC: 3.03 MIL/uL — ABNORMAL LOW (ref 3.87–5.11)
RDW: 15.2 % (ref 11.5–15.5)
WBC: 10 10*3/uL (ref 4.0–10.5)
nRBC: 0.2 % (ref 0.0–0.2)

## 2020-06-16 LAB — GLUCOSE, CAPILLARY
Glucose-Capillary: 143 mg/dL — ABNORMAL HIGH (ref 70–99)
Glucose-Capillary: 128 mg/dL — ABNORMAL HIGH (ref 70–99)
Glucose-Capillary: 144 mg/dL — ABNORMAL HIGH (ref 70–99)
Glucose-Capillary: 181 mg/dL — ABNORMAL HIGH (ref 70–99)

## 2020-06-16 LAB — FOLATE: Folate: 8.3 ng/mL (ref >5.9)

## 2020-06-16 MED ORDER — TECHNETIUM TO 99M ALBUMIN AGGREGATED
5.0000 | Freq: Once | INTRAVENOUS | Status: AC | PRN
  Administered 2020-06-16: 5 via INTRAVENOUS

## 2020-06-16 MED ORDER — ONDANSETRON 4 MG PO TBDP
8.0000 mg | ORAL_TABLET | Freq: Three times a day (TID) | ORAL | Status: DC | PRN
  Administered 2020-06-16 (×2): 8 mg via ORAL
  Filled 2020-06-16 (×3): qty 2

## 2020-06-16 MED ORDER — SODIUM ZIRCONIUM CYCLOSILICATE 10 G PO PACK
10.0000 g | PACK | Freq: Once | ORAL | Status: AC
  Administered 2020-06-16: 10 g via ORAL
  Filled 2020-06-16: qty 1

## 2020-06-16 NOTE — Progress Notes (Signed)
Nephrology Follow-Up Consult note   Assessment/Recommendations: Donna Howe is a/an 64 y.o. female with a past medical history significant for DM2, SBO, CAD s/p CABG and PCIs, PAD, HTN, HLD, CKD who present w/ chest pain and AKI  Non-Oliguric AKI on CKD IV/V: Baseline CKD related to hypertension, diabetes, arterionephrosclerosis being set up to initiate HD in the near future on the outpt side. AKI here possibly related to hemodynamic changes/insults from unstable angina and intermittent hypotension. Kidney function fairly stable. -Gentle with HTN meds avoiding SBP < 100 as able -Continue to monitor for symptoms of uremia; no indications for dialysis at this time -Continue to monitor daily Cr, Dose meds for GFR -Monitor Daily I/Os, Daily weight  -Maintain MAP>65 for optimal renal perfusion as able.  -Avoid nephrotoxic medications including NSAIDs and Vanc/Zosyn combo -Would consider renal ultrasound if creatinine continued to rise  Volume Status: Appears mildly hypervolemic on exam with lower extremity edema.  Would allow patient to drink to thirst.  No need for excessive hydration  Hypertension: History of such with low blood pressures and management as above.  Anemia due to CKD: Hemoglobin 8.9.  Iron sat 60 and ferritin very low. May consider repeat iron testing outpatient. Hold on ESA for now unless Hgb consistently <9.  Uncontrolled Diabetes Mellitus Type 2 with Hyperglycemia: mgmt per primary  Hyperkalemia: Potassium 5.4 likely related to renal insufficiency.  1x lokelma today.  Unstable angina: Cardiology following. Symptoms improved today. Possible some component of GERD. CTM   Recommendations conveyed to primary service.    [facility] 06/16/2020 9:23 AM  ___________________________________________________________  CC: AKI on CKD V  Interval History/Subjective: Patient states she had some SOB upon wakening today but it improved  after she drank some water. Thinks the room is dry. Says her pain is not as bad today. Off nitroglycerin at this time. Pain did not worsen with exertion yesterday. No issues urinating.   Medications:  Current Facility-Administered Medications  Medication Dose Route Frequency Provider Last Rate Last Admin  . acetaminophen (TYLENOL) tablet 650 mg  650 mg Oral Q4H PRN Tat, David, MD      . albuterol (VENTOLIN HFA) 108 (90 Base) MCG/ACT inhaler 2 puff  2 puff Inhalation Q4H PRN Tat, David, MD      . ALPRAZolam Duanne Moron) tablet 0.25 mg  0.25 mg Oral BID PRN Orson Eva, MD   0.25 mg at 06/14/20 2015  . alum & mag hydroxide-simeth (MAALOX/MYLANTA) 200-200-20 MG/5ML suspension 30 mL  30 mL Oral Q4H PRN Tat, David, MD      . aspirin EC tablet 81 mg  81 mg Oral Daily Tat, David, MD   81 mg at 06/15/20 1038  . atorvastatin (LIPITOR) tablet 80 mg  80 mg Oral QPM Orson Eva, MD   80 mg at 06/15/20 1841  . budesonide (PULMICORT) nebulizer solution 0.5 mg  0.5 mg Nebulization BID Tat, David, MD   0.5 mg at 06/15/20 1932  . Chlorhexidine Gluconate Cloth 2 % PADS 6 each  6 each Topical Daily Tat, David, MD   6 each at 06/15/20 1038  . cilostazol (PLETAL) tablet 100 mg  100 mg Oral BID Mina Marble, MD   100 mg at 06/16/20 0921  . clopidogrel (PLAVIX) tablet 75 mg  75 mg Oral Daily Tat, David, MD   75 mg at 06/15/20 1038  . famotidine (PEPCID) tablet 10 mg  10 mg Oral Daily Tat, David, MD   10 mg at 06/15/20  1037  . ferrous sulfate tablet 325 mg  325 mg Oral Q breakfast Tat, Shanon Brow, MD   325 mg at 06/16/20 0921  . gabapentin (NEURONTIN) capsule 400 mg  400 mg Oral BID Orson Eva, MD   400 mg at 06/15/20 2139  . guaiFENesin-dextromethorphan (ROBITUSSIN DM) 100-10 MG/5ML syrup 5 mL  5 mL Oral Q4H PRN Orson Eva, MD   5 mL at 06/15/20 2139  . heparin injection 5,000 Units  5,000 Units Subcutaneous Franco Collet, MD   5,000 Units at 06/16/20 [phone]  . insulin aspart (novoLOG) injection 0-20 Units  0-20 Units  Subcutaneous TID WC Orson Eva, MD   3 Units at 06/16/20 [phone]  . insulin aspart (novoLOG) injection 0-5 Units  0-5 Units Subcutaneous Benay Pike, MD   4 Units at 06/14/20 2233  . insulin detemir (LEVEMIR) injection 20 Units  20 Units Subcutaneous Benay Pike, MD   20 Units at 06/15/20 2139  . ipratropium-albuterol (DUONEB) 0.5-2.5 (3) MG/3ML nebulizer solution 3 mL  3 mL Nebulization BID Tat, David, MD   3 mL at 06/15/20 1933  . levothyroxine (SYNTHROID) tablet 125 mcg  125 mcg Oral Daily Tat, Shanon Brow, MD   125 mcg at 06/16/20 [phone]  . metoprolol tartrate (LOPRESSOR) tablet 12.5 mg  12.5 mg Oral BID Orson Eva, MD   12.5 mg at 06/15/20 2139  . ondansetron (ZOFRAN) injection 4 mg  4 mg Intravenous Q6H PRN Orson Eva, MD   4 mg at 06/16/20 0526  . pantoprazole (PROTONIX) EC tablet 40 mg  40 mg Oral Daily Tat, David, MD   40 mg at 06/15/20 1037  . ranolazine (RANEXA) 12 hr tablet 500 mg  500 mg Oral BID Orson Eva, MD   500 mg at 06/15/20 2136  . sodium bicarbonate tablet 650 mg  650 mg Oral BID Orson Eva, MD   650 mg at 06/15/20 2139      Review of Systems: 10 systems reviewed and negative except per interval history/subjective  Physical Exam: Vitals:   06/16/20 0134 06/16/20 0511  BP: (!) 109/37 127/64  Pulse: (!) 47 (!) 58  Resp: 20 (!) 24  Temp: 98.1 F (36.7 C) 98.5 F (36.9 C)  SpO2: 92% 95%   No intake/output data recorded. No intake or output data in the 24 hours ending 06/16/20 0923  General: well-appearing, no acute distress CV: bradycardia, 2/6 systolic murmur, 1+ pitting edema in the BLE Lungs: no iwob, bilateral chest rise Abd: soft, non-tender, non-distended Skin: no visible lesions or rashes Psych: alert, engaged, appropriate mood and affect Musculoskeletal: no obvious deformities Neuro: normal speech, no gross focal deficits     Test Results I personally reviewed new and old clinical labs and radiology tests Lab Results  Component Value Date   NA 136  06/16/2020   K 5.4 (H) 06/16/2020   CL 106 06/16/2020   CO2 23 06/16/2020   BUN 92 (H) 06/16/2020   CREATININE 4.50 (H) 06/16/2020   CALCIUM 8.3 (L) 06/16/2020   ALBUMIN 2.9 (L) 06/16/2020   PHOS 4.1 06/16/2020

## 2020-06-16 NOTE — Progress Notes (Signed)
Pt c/o chest pain, rated 4-5, denies anxiety. Before coming back to check vitals patient completed a breathing treatment and states the pain had went away but she felt like she had a fever. Axillary temp 98.2, BP 109/47 HR 66, sats 93% on RA. Will continue to monitor.

## 2020-06-16 NOTE — Progress Notes (Signed)
Dr. Harl Bowie in and evaluated pt. Pt c/o continued nausea, ate only bites of breakfast. Pt advised not time for additional anti-nausea med and stated understanding. Pt took scheduled 0800 am meds with sip of water. Pt now down to diagnostic imaging via wheelchair for VQ scan.

## 2020-06-16 NOTE — Progress Notes (Signed)
Pt earlier today asking about discharge, now states that she doesn't feel well enough to go home. Concerned that her B/P is "low" (91/46), pt states she feels weak and her hands are trembling (not witnessed by this nurse) and that her head feels "Foggy". MD Dyann Kief notified of pt's current VS and complaints/concerns.

## 2020-06-16 NOTE — Progress Notes (Signed)
Progress Note  Patient Name: Donna Howe Date of Encounter: 06/16/2020  Primary Cardiologist: Rozann Lesches, MD  Subjective   Feels weak and tired.  No chest pain this morning.  Did feel short of breath when she got up.  Inpatient Medications    Scheduled Meds: . aspirin EC  81 mg Oral Daily  . atorvastatin  80 mg Oral QPM  . budesonide (PULMICORT) nebulizer solution  0.5 mg Nebulization BID  . Chlorhexidine Gluconate Cloth  6 each Topical Daily  . cilostazol  100 mg Oral BID AC  . clopidogrel  75 mg Oral Daily  . famotidine  10 mg Oral Daily  . ferrous sulfate  325 mg Oral Q breakfast  . gabapentin  400 mg Oral BID  . heparin  5,000 Units Subcutaneous Q8H  . insulin aspart  0-20 Units Subcutaneous TID WC  . insulin aspart  0-5 Units Subcutaneous QHS  . insulin detemir  20 Units Subcutaneous QHS  . ipratropium-albuterol  3 mL Nebulization BID  . levothyroxine  125 mcg Oral Daily  . metoprolol tartrate  12.5 mg Oral BID  . pantoprazole  40 mg Oral Daily  . ranolazine  500 mg Oral BID  . sodium bicarbonate  650 mg Oral BID  . sodium zirconium cyclosilicate  10 g Oral Once    PRN Meds: acetaminophen, albuterol, ALPRAZolam, alum & mag hydroxide-simeth, guaiFENesin-dextromethorphan, ondansetron (ZOFRAN) IV   Vital Signs    Vitals:   06/15/20 1933 06/15/20 2047 06/16/20 0134 06/16/20 0511  BP:  (!) 125/42 (!) 109/37 127/64  Pulse: 77 (!) 59 (!) 47 (!) 58  Resp: 18 20 20  (!) 24  Temp:  98.9 F (37.2 C) 98.1 F (36.7 C) 98.5 F (36.9 C)  TempSrc:      SpO2: 96% 92% 92% 95%  Weight:      Height:       No intake or output data in the 24 hours ending 06/16/20 0934 Filed Weights   06/12/20 2105 06/13/20 1847 06/15/20 0500  Weight: 98.9 kg 101.9 kg 107.4 kg    Telemetry    Sinus rhythm.  Personally reviewed.  ECG    An ECG dated 06/12/2020 was personally reviewed today and demonstrated:  Sinus rhythm with IVCD and diffuse repolarization  abnormalities.  Physical Exam   GEN: No acute distress.   Neck: No JVD. Cardiac: RRR, 2/6 systolic murmur.  Respiratory: Nonlabored. Clear to auscultation bilaterally. GI: Soft, nontender, bowel sounds present. MS: No edema; No deformity.  Labs    Chemistry Recent Labs  Lab 06/14/20 0841 06/15/20 0544 06/16/20 0604  NA 137 134*  134* 136  K 4.5 5.3*  5.3* 5.4*  CL 105 105  104 106  CO2 22 21*  21* 23  GLUCOSE 181* 254*  254* 125*  BUN 81* 90*  89* 92*  CREATININE 4.49* 4.60*  4.64* 4.50*  CALCIUM 8.1* 8.1*  8.1* 8.3*  ALBUMIN 2.9* 2.8* 2.9*  GFRNONAA 10* 10*  10* 10*  GFRAA 11* 11*  11* 11*  ANIONGAP 10 8  9 7      Hematology Recent Labs  Lab 06/14/20 0841 06/15/20 0544 06/16/20 0604  WBC 9.9 9.0 10.0  RBC 3.22* 2.85* 3.03*  HGB 9.8* 8.4* 8.9*  HCT 30.9* 26.7* 28.8*  MCV 96.0 93.7 95.0  MCH 30.4 29.5 29.4  MCHC 31.7 31.5 30.9  RDW 14.8 14.6 15.2  PLT 144* 120* 123*    Cardiac Enzymes Recent Labs  Lab 06/12/20 2118 06/13/20  0023 06/13/20 1452 06/13/20 1642  TROPONINIHS 20* 39* 160* 141*    Radiology    No results found.  Cardiac Studies   Echocardiogram 06/13/2020: 1. Left ventricular ejection fraction, by estimation, is 55 to 60%. The  left ventricle has normal function. The left ventricle has no regional  wall motion abnormalities. There is mild concentric left ventricular  hypertrophy. Left ventricular diastolic  parameters are consistent with Grade II diastolic dysfunction  (pseudonormalization). Elevated left ventricular end-diastolic pressure.  2. Right ventricular systolic function is normal. The right ventricular  size is normal. There is normal pulmonary artery systolic pressure.  3. Left atrial size was mildly dilated.  4. Right atrial size was mildly dilated.  5. The mitral valve is normal in structure. Trivial mitral valve  regurgitation. No evidence of mitral stenosis.  6. The aortic valve is tricuspid. Aortic  valve regurgitation is not  visualized. No aortic stenosis is present.  7. The inferior vena cava is normal in size with greater than 50%  respiratory variability, suggesting right atrial pressure of 3 mmHg.   Patient Profile     64 y.o. female with a history of type 2 diabetes mellitus, CAD status post CABG and DES interventions, PAD, hypertension, hyperlipidemia, and progressive renal disease.  Assessment & Plan    1.  Unstable angina with peak high-sensitivity troponin I 160 in the setting of CKD stage IV-V.  Managing medically due to high risk of contrast-induced nephropathy with repeat cardiac catheterization.  She has completed course of heparin.  Low blood pressure limits therapeutic options.  2.  Multivessel CAD status post CABG in 2005 with subsequent stent SVG to RCA and Cutting Balloon to the proximal circumflex 05/2017, PTCA to the proximal circumflex stage DES to the SVG to RCA and DES LIMA to the LAD 03/2018.  3.  CKD stage V, creatinine 4.5 with GFR 10.  She is pending outpatient vascular consultation for fistula discussion.  He has been  4.  Anemia of chronic disease.  Downward trend in hemoglobin also noted during hospital stay, 11.5 down to 9.8.  No obvious bleeding reported.  Current cardiac regimen includes aspirin, Plavix, Lipitor, Lopressor, and now starting on low-dose Ranexa.  Current blood pressure limits use of low-dose nitrate or amlodipine, but this may ultimately be a consideration.  No further ischemic testing is planned at this time.  Signed, Rozann Lesches, MD  06/16/2020, 9:34 AM

## 2020-06-16 NOTE — Progress Notes (Signed)
PROGRESS NOTE  Donna Howe JJO:[id number] DOB: 18-Aug-1956 DOA: 06/12/2020 PCP: [facility], [facility]  Brief History:  64 year old female with a history of diabetes mellitus type 2, hyperlipidemia, hypertension, CKD stage V, coronary artery diseasewith NSTEMI and DES, right carotid stenosis, peripheral arterial disease, and obesity presenting with chest pain. Patient states that she has had intermittent chest pain since 06/10/2020. She states that she has been taking approximately 2 nitro on a daily basis that relieved her chest discomfort. She states that her chest discomfort usually last approximately 15 to 20 minutes. It is worse with exertion. On 06/12/2020, the patient took a second nitroglycerin without much relief of her chest discomfort. As result, EMS was activated. The patient states that the chest discomfort radiated down to her left arm. It feels like a throbbing sensation. She has some associated shortness of breath. She denies any fevers, chills, headache, nausea, vomiting, diarrhea, abdominal pain. The patient went to see her PCP on 06/09/2020. She was started on levofloxacin 750 mg daily and prednisone as well as Tessalon Perles for bronchitis. She denies any NSAIDs. In the emergency department, the patient was afebrile hemodynamically stable. Oxygen saturation was 91% on room air. She was placed on 2 L nasal cannula. BMP showed potassium 4.7, serum creatinine 4.77. WBC 12.4, hemoglobin 9.5, platelets 195,000. Chest x-ray showed right basal atelectasis and increased interstitial markings. The patient was started on IV heparin and IV nitroglycerin. Troponins 20>>>39>>160>>141  Assessment/Plan: Unstable Angina/NSTEMI -Presently pain-free -Patient has completed 48 hours of IV heparin. -Echocardiogram--EF 55-60%, no WMA, grade 2 DD -Cardiology consultation appreciated -04/09/2018 heart catheterization--ost LAD-prox LAD 80%, ost 1st margina 80%, mid  RCA 90%, LIMA-LAD 70%; DES x 2 placed -Continue aspirin and Plavix -Continue statin and low-dose beta-blocker. -Personally reviewed EKG--sinus rhythm, ST depression V4-V6 -LDL 67  Acute respiratory failure with hypoxia -initially upper 80s on RA -due to COPD and acute bronchitis -Condition has stabilized Good O2 sat on room air appreciated.  Acute bronchitis -continue duo nebs -continue pulmicort -COVID-19 negative -Respiratory viral panel negative -Check procalcitonin <0.10 -Personally reviewed chest x-ray--RLLatelectasis, increased interstitial markings -Instructed to use flutter valve. -No need for antibiotics currently  Acute on chronic renal failure--CKD stage V -Baseline creatinine 3.1-3.4 -Patient had recent blood work at [facility]8/20/2021 with serum creatinine 4.01 -Likely secondary to hemodynamic changes versus progression of underlying CKD -Gentle hydration--done--now euvolemic to hypervolemic -Patient follows Dr. Joelyn Oms in the outpatient setting -Continue cesarean bicarbonate -She was scheduled for an appointment with[facility] 06/17/2020 for what sounds to be venous mapping for fistula -consultation by renal service--appreciated  Hemoptysis -?vigorous cough with IV heparin -V/Q scan -Negative for PE.  Diabetes mellitus type 2 with nephropathy -Continue sliding scale insulin and Lantus -Follow CBGs and adjust hypoglycemic regimen as needed. -Hemoglobin A1c--6.2  Essential hypertension Hydralazine has been discontinued secondary to low blood pressure -Continue holding isosorbide in the setting of soft blood pressure. -Started low-dose beta-blocker per cardiology service recommendation. -Amlodipine will be discontinued -Follow heart healthy diet.  Hypothyroidism -Continue Synthroid -am TSH  Class II obesity -Body mass index is 38.22 kg/m. -Low calorie diet, portion control and increase physical activity discussed with  patient.  Hyperlipidemia -Continue statin -Heart healthy diet encourage  Peripheral arterial disease -Continue Pletal  Anemia of CKD -drop in Hgb likely secondary to hemodilution -FOBT negative. -baseline Hgb 10-11 -iron saturation 61%, ferritin 4 -Normal B12 and folate -IV iron and Epogen as per renal discretion. -No signs of overt  bleeding.  Hyperkalemia -observe without tx for now per renal -Electrolytes stable -Continue to follow trend. -Continue telemetry monitoring.    Status is: Inpatient    Dispo: The patient is from: Home  Anticipated d/c is to: Home  Anticipated d/c date is: 9/30-21  Patient currently is not medically stable to d/c.  Physical therapy has been asked to see patient, evaluation and provide recommendations.  Hopefully will feel better for discharge tomorrow.      Family Communication:   No family at bedside.  Consultants:  Cardiology; renal  Code Status:  FULL   DVT Prophylaxis:  IV Heparin>>[place]   Procedures: As Listed in Progress Note Above  Antibiotics: None   Subjective: Reports no having any chest pain, nausea vomiting currently.  Still with short of breath or dyspnea on exertion.  Feeling weak tired; with intermittent tremors and foggy brain.  Objective: Vitals:   06/16/20 0134 06/16/20 0511 06/16/20 1053 06/16/20 1409  BP: (!) 109/37 127/64  (!) 91/46  Pulse: (!) 47 (!) 58  (!) 58  Resp: 20 (!) 24  16  Temp: 98.1 F (36.7 C) 98.5 F (36.9 C)  98.3 F (36.8 C)  TempSrc:      SpO2: 92% 95% 94% 92%  Weight:      Height:        Intake/Output Summary (Last 24 hours) at 06/16/2020 1650 Last data filed at 06/16/2020 0800 Gross per 24 hour  Intake 0 ml  Output --  Net 0 ml   Weight change:   Exam:  General exam: Alert, awake, oriented x 3; feeling weak, tired and with Foggy brain sensation.  No nausea, no vomiting, no chest pain currently.  Still short of breath  with activity. Respiratory system:  Cardiovascular system:RRR. No rubs or gallops.  Unable to properly assess JVD with body habitus. Gastrointestinal system: Abdomen is nondistended, soft and nontender. No organomegaly or masses felt. Normal bowel sounds heard. Central nervous system: Alert and oriented. No focal neurological deficits. Extremities: No cyanosis or clubbing; trace edema appreciated on exam. Skin: No rashes, no petechiae or lymphangitis. Psychiatry: Judgement and insight appear normal. Mood & affect appropriate.    Data Reviewed: I have personally reviewed following labs and imaging studies  Basic Metabolic Panel: Recent Labs  Lab 06/13/20 0601 06/14/20 0717 06/14/20 0841 06/15/20 0544 06/16/20 0604  NA 138 137 137 134*  134* 136  K 4.7 3.8 4.5 5.3*  5.3* 5.4*  CL 105 98 105 105  104 106  CO2 22 32 22 21*  21* 23  GLUCOSE 113* 176* 181* 254*  254* 125*  BUN 80* 19 81* 90*  89* 92*  CREATININE 4.64* 0.58 4.49* 4.60*  4.64* 4.50*  CALCIUM 8.7* 7.7* 8.1* 8.1*  8.1* 8.3*  MG  --   --   --  2.1  --   PHOS  --  2.3* 5.7* 4.8* 4.1   Liver Function Tests: Recent Labs  Lab 06/14/20 0717 06/14/20 0841 06/15/20 0544 06/16/20 0604  ALBUMIN 2.0* 2.9* 2.8* 2.9*   CBC: Recent Labs  Lab 06/12/20 2118 06/13/20 0601 06/14/20 0841 06/15/20 0544 06/16/20 0604  WBC 12.4* 11.0* 9.9 9.0 10.0  NEUTROABS  --   --  7.5  --   --   HGB 11.5* 10.5* 9.8* 8.4* 8.9*  HCT 35.6* 33.1* 30.9* 26.7* 28.8*  MCV 93.2 93.0 96.0 93.7 95.0  PLT 195 165 144* 120* 123*   CBG: Recent Labs  Lab 06/15/20 1554 06/15/20 2048 06/16/20  0735 06/16/20 1116 06/16/20 1639  GLUCAP 128* 132* 143* 128* 144*   Urine analysis:    Component Value Date/Time   COLORURINE YELLOW 12/18/2019 1353   APPEARANCEUR HAZY (A) 12/18/2019 1353   LABSPEC 1.016 12/18/2019 1353   PHURINE 5.0 12/18/2019 1353   GLUCOSEU 50 (A) 12/18/2019 1353   HGBUR NEGATIVE 12/18/2019 1353   BILIRUBINUR NEGATIVE  12/18/2019 1353   KETONESUR NEGATIVE 12/18/2019 1353   PROTEINUR >=300 (A) 12/18/2019 1353   UROBILINOGEN 0.2 10/26/2013 2229   NITRITE NEGATIVE 12/18/2019 1353   LEUKOCYTESUR NEGATIVE 12/18/2019 1353   Sepsis Labs: @LABRCNTIP (procalcitonin:4,lacticidven:4) ) Recent Results (from the past 240 hour(s))  Respiratory Panel by RT PCR (Flu A&B, Covid) - Nasopharyngeal Swab     Status: None   Collection Time: 06/12/20 10:57 PM   Specimen: Nasopharyngeal Swab  Result Value Ref Range Status   SARS Coronavirus 2 by RT PCR NEGATIVE NEGATIVE Final    Comment: (NOTE) SARS-CoV-2 target nucleic acids are NOT DETECTED.  The SARS-CoV-2 RNA is generally detectable in upper respiratoy specimens during the acute phase of infection. The lowest concentration of SARS-CoV-2 viral copies this assay can detect is 131 copies/mL. A negative result does not preclude SARS-Cov-2 infection and should not be used as the sole basis for treatment or other patient management decisions. A negative result may occur with  improper specimen collection/handling, submission of specimen other than nasopharyngeal swab, presence of viral mutation(s) within the areas targeted by this assay, and inadequate number of viral copies (<131 copies/mL). A negative result must be combined with clinical observations, patient history, and epidemiological information. The expected result is Negative.  Fact Sheet for Patients:  PinkCheek.be  Fact Sheet for Healthcare Providers:  GravelBags.it  This test is no t yet approved or cleared by the [place] FDA and  has been authorized for detection and/or diagnosis of SARS-CoV-2 by FDA under an Emergency Use Authorization (EUA). This EUA will remain  in effect (meaning this test can be used) for the duration of the COVID-19 declaration under Section 564(b)(1) of the Act, 21 U.S.C. section 360bbb-3(b)(1), unless the  authorization is terminated or revoked sooner.     Influenza A by PCR NEGATIVE NEGATIVE Final   Influenza B by PCR NEGATIVE NEGATIVE Final    Comment: (NOTE) The Xpert Xpress SARS-CoV-2/FLU/RSV assay is intended as an aid in  the diagnosis of influenza from Nasopharyngeal swab specimens and  should not be used as a sole basis for treatment. Nasal washings and  aspirates are unacceptable for Xpert Xpress SARS-CoV-2/FLU/RSV  testing.  Fact Sheet for Patients: PinkCheek.be  Fact Sheet for Healthcare Providers: GravelBags.it  This test is not yet approved or cleared by the [place] FDA and  has been authorized for detection and/or diagnosis of SARS-CoV-2 by  FDA under an Emergency Use Authorization (EUA). This EUA will remain  in effect (meaning this test can be used) for the duration of the  Covid-19 declaration under Section 564(b)(1) of the Act, 21  U.S.C. section 360bbb-3(b)(1), unless the authorization is  terminated or revoked. Performed at [facility], [address] [place], [place] [address]   MRSA PCR Screening     Status: None   Collection Time: 06/13/20  6:51 PM   Specimen: Nasopharyngeal  Result Value Ref Range Status   MRSA by PCR NEGATIVE NEGATIVE Final    Comment:        The GeneXpert MRSA Assay (FDA approved for NASAL specimens only), is one component of a comprehensive  MRSA colonization surveillance program. It is not intended to diagnose MRSA infection nor to guide or monitor treatment for MRSA infections. Performed at [facility], [address] [place], [place] [address]      Scheduled Meds: . aspirin EC  81 mg Oral Daily  . atorvastatin  80 mg Oral QPM  . budesonide (PULMICORT) nebulizer solution  0.5 mg Nebulization BID  . Chlorhexidine Gluconate Cloth  6 each Topical Daily  . cilostazol  100 mg Oral BID AC  . clopidogrel  75 mg Oral Daily  . famotidine  10 mg Oral Daily  .  ferrous sulfate  325 mg Oral Q breakfast  . gabapentin  400 mg Oral BID  . heparin  5,000 Units Subcutaneous Q8H  . insulin aspart  0-20 Units Subcutaneous TID WC  . insulin aspart  0-5 Units Subcutaneous QHS  . insulin detemir  20 Units Subcutaneous QHS  . ipratropium-albuterol  3 mL Nebulization BID  . levothyroxine  125 mcg Oral Daily  . metoprolol tartrate  12.5 mg Oral BID  . pantoprazole  40 mg Oral Daily  . ranolazine  500 mg Oral BID  . sodium bicarbonate  650 mg Oral BID   Continuous Infusions:  Procedures/Studies: DG Chest 2 View  Result Date: 06/12/2020 CLINICAL DATA:  Chest pain and fatigue throughout the day. EXAM: CHEST - 2 VIEW COMPARISON:  06/11/2020 FINDINGS: Cardiac enlargement. No vascular congestion, edema, or consolidation. No pleural effusions. No pneumothorax. Mediastinal contours appear intact. Postoperative changes in the mediastinum. No significant changes since prior study. IMPRESSION: Cardiac enlargement.  No active pulmonary disease. Electronically Signed   By: Lucienne Capers M.D.   On: 06/12/2020 21:40   DG Abd 1 View  Result Date: 06/13/2020 CLINICAL DATA:  Abdominal bloating EXAM: ABDOMEN - 1 VIEW COMPARISON:  Radiograph 01/09/2010, chest radiograph 06/12/2020 FINDINGS: No high-grade obstructive bowel gas pattern is seen. There is a moderate colonic stool burden. About limited evaluation for free air on this supine only radiograph. No Rigler sign or other secondary features. Cholecystectomy clips in the right upper quadrant. Vascular calcium noted in the upper abdomen. Prior sternotomy and CABG as well as vascular stenting of the coronaries seen in the lung bases with some mild atelectatic change. No acute or suspicious osseous abnormalities. Degenerative changes in the lumbar spine, hips and pelvis. IMPRESSION: 1. No high-grade obstructive bowel gas pattern. 2. Moderate colonic stool burden. 3. Prior cholecystectomy. 4. Bibasilar atelectasis. 5. Prior  sternotomy and CABG. Electronically Signed   By: Lovena Le M.D.   On: 06/13/2020 02:30   NM Pulmonary Perfusion  Result Date: 06/16/2020 CLINICAL DATA:  Chest pain and hemoptysis EXAM: NUCLEAR MEDICINE PERFUSION LUNG SCAN TECHNIQUE: Perfusion images were obtained in multiple projections after intravenous injection of radiopharmaceutical. Views: Anterior, posterior, left lateral, right lateral, RPO, LPO, RAO, LAO RADIOPHARMACEUTICALS:  5.0 mCi Tc-43m MAA IV COMPARISON:  Chest radiograph June 12, 2020 FINDINGS: Radiotracer uptake is homogeneous and symmetric bilaterally. No perfusion defects evident. Heart noted to be prominent. IMPRESSION: No perfusion defects. No findings suggesting pulmonary embolus. Cardiac prominence noted. Electronically Signed   By: Lowella Grip III M.D.   On: 06/16/2020 13:09   US Venous Img Lower Bilateral (DVT)  Result Date: 06/16/2020 CLINICAL DATA:  Chest pain and hemoptysis EXAM: BILATERAL LOWER EXTREMITY VENOUS DOPPLER ULTRASOUND TECHNIQUE: Gray-scale sonography with graded compression, as well as color Doppler and duplex ultrasound were performed to evaluate the lower extremity deep venous systems from the level of the common  femoral vein and including the common femoral, femoral, profunda femoral, popliteal and calf veins including the posterior tibial, peroneal and gastrocnemius veins when visible. The superficial great saphenous vein was also interrogated. Spectral Doppler was utilized to evaluate flow at rest and with distal augmentation maneuvers in the common femoral, femoral and popliteal veins. COMPARISON:  None. FINDINGS: RIGHT LOWER EXTREMITY Common Femoral Vein: No evidence of thrombus. Normal compressibility, respiratory phasicity and response to augmentation. Saphenofemoral Junction: No evidence of thrombus. Normal compressibility and flow on color Doppler imaging. Profunda Femoral Vein: No evidence of thrombus. Normal compressibility and flow on color  Doppler imaging. Femoral Vein: No evidence of thrombus. Normal compressibility, respiratory phasicity and response to augmentation. Popliteal Vein: No evidence of thrombus. Normal compressibility, respiratory phasicity and response to augmentation. Calf Veins: No evidence of thrombus. Normal compressibility and flow on color Doppler imaging. Superficial Great Saphenous Vein: No evidence of thrombus. Normal compressibility. Venous Reflux:  None. Other Findings:  Calf edema is noted. LEFT LOWER EXTREMITY Common Femoral Vein: No evidence of thrombus. Normal compressibility, respiratory phasicity and response to augmentation. Saphenofemoral Junction: No evidence of thrombus. Normal compressibility and flow on color Doppler imaging. Profunda Femoral Vein: No evidence of thrombus. Normal compressibility and flow on color Doppler imaging. Femoral Vein: No evidence of thrombus. Normal compressibility, respiratory phasicity and response to augmentation. Popliteal Vein: No evidence of thrombus. Normal compressibility, respiratory phasicity and response to augmentation. Calf Veins: No evidence of thrombus. Normal compressibility and flow on color Doppler imaging. Superficial Great Saphenous Vein: No evidence of thrombus. Normal compressibility. Venous Reflux:  None. Other Findings:  Calf edema is noted. IMPRESSION: No evidence of deep venous thrombosis in either lower extremity. Electronically Signed   By: Inez Catalina M.D.   On: 06/16/2020 14:45   ECHOCARDIOGRAM COMPLETE  Result Date: 06/13/2020    ECHOCARDIOGRAM REPORT   Patient Name:   TAREN DYMEK Date of Exam: 06/13/2020 Medical Rec #:  [id number]        Height:       66.0 in Accession #:    [id number]       Weight:       218.0 lb Date of Birth:  1956-06-25       BSA:          2.074 m Patient Age:    9 years         BP:           136/61 mmHg Patient Gender: F                HR:           65 bpm. Exam Location:  [facility] Procedure: 2D Echo, Cardiac Doppler and  Color Doppler Indications:    Chest pain  History:        Patient has prior history of Echocardiogram examinations, most                 recent 10/02/2019. CAD and Previous Myocardial Infarction, Prior                 CABG, PAD, Signs/Symptoms:Chest Pain; Risk Factors:Diabetes,                 Hypertension, Dyslipidemia and Current Smoker. CKD.  Sonographer:    Dustin Flock RDCS Referring Phys: [id number] ASIA B [place]  Sonographer Comments: Patient is morbidly obese. IMPRESSIONS  1. Left ventricular ejection fraction, by estimation, is 55 to 60%. The left ventricle has normal function. The  left ventricle has no regional wall motion abnormalities. There is mild concentric left ventricular hypertrophy. Left ventricular diastolic parameters are consistent with Grade II diastolic dysfunction (pseudonormalization). Elevated left ventricular end-diastolic pressure.  2. Right ventricular systolic function is normal. The right ventricular size is normal. There is normal pulmonary artery systolic pressure.  3. Left atrial size was mildly dilated.  4. Right atrial size was mildly dilated.  5. The mitral valve is normal in structure. Trivial mitral valve regurgitation. No evidence of mitral stenosis.  6. The aortic valve is tricuspid. Aortic valve regurgitation is not visualized. No aortic stenosis is present.  7. The inferior vena cava is normal in size with greater than 50% respiratory variability, suggesting right atrial pressure of 3 mmHg. FINDINGS  Left Ventricle: Left ventricular ejection fraction, by estimation, is 55 to 60%. The left ventricle has normal function. The left ventricle has no regional wall motion abnormalities. The left ventricular internal cavity size was normal in size. There is  mild concentric left ventricular hypertrophy. Left ventricular diastolic parameters are consistent with Grade II diastolic dysfunction (pseudonormalization). Elevated left ventricular end-diastolic pressure. Right  Ventricle: The right ventricular size is normal. No increase in right ventricular wall thickness. Right ventricular systolic function is normal. There is normal pulmonary artery systolic pressure. The tricuspid regurgitant velocity is 2.31 m/s, and  with an assumed right atrial pressure of 3 mmHg, the estimated right ventricular systolic pressure is 16.1 mmHg. Left Atrium: Left atrial size was mildly dilated. Right Atrium: Right atrial size was mildly dilated. Pericardium: There is no evidence of pericardial effusion. Mitral Valve: The mitral valve is normal in structure. Trivial mitral valve regurgitation. No evidence of mitral valve stenosis. Tricuspid Valve: The tricuspid valve is normal in structure. Tricuspid valve regurgitation is trivial. No evidence of tricuspid stenosis. Aortic Valve: The aortic valve is tricuspid. Aortic valve regurgitation is not visualized. No aortic stenosis is present. Pulmonic Valve: The pulmonic valve was normal in structure. Pulmonic valve regurgitation is not visualized. No evidence of pulmonic stenosis. Aorta: The aortic root is normal in size and structure. Venous: The inferior vena cava is normal in size with greater than 50% respiratory variability, suggesting right atrial pressure of 3 mmHg. IAS/Shunts: No atrial level shunt detected by color flow Doppler.  LEFT VENTRICLE PLAX 2D LVIDd:         5.18 cm  Diastology LVIDs:         3.64 cm  LV e' medial:    6.09 cm/s LV PW:         1.31 cm  LV E/e' medial:  19.5 LV IVS:        1.31 cm  LV e' lateral:   7.29 cm/s LVOT diam:     1.80 cm  LV E/e' lateral: 16.3 LV SV:         79 LV SV Index:   38 LVOT Area:     2.54 cm  RIGHT VENTRICLE RV Basal diam:  3.50 cm RV S prime:     7.72 cm/s TAPSE (M-mode): 2.5 cm LEFT ATRIUM             Index       RIGHT ATRIUM           Index LA diam:        3.70 cm 1.78 cm/m  RA Area:     19.70 cm LA Vol (A2C):   29.1 ml 14.03 ml/m RA Volume:   62.30 ml  30.03 ml/m LA  Vol (A4C):   67.1 ml 32.35  ml/m LA Biplane Vol: 48.3 ml 23.28 ml/m  AORTIC VALVE LVOT Vmax:   129.00 cm/s LVOT Vmean:  92.200 cm/s LVOT VTI:    0.312 m  AORTA Ao Root diam: 3.30 cm MITRAL VALVE                TRICUSPID VALVE MV Area (PHT): 2.71 cm     TR Peak grad:   21.3 mmHg MV Decel Time: 280 msec     TR Vmax:        231.00 cm/s MV E velocity: 119.00 cm/s MV A velocity: 104.00 cm/s  SHUNTS MV E/A ratio:  1.14         Systemic VTI:  0.31 m                             Systemic Diam: 1.80 cm Skeet Latch md Electronically signed by Skeet Latch md Signature Date/Time: 06/13/2020/12:43:41 PM    Final    Time spent: 35 minutes.  Barton Dubois, MD  [facility]  If 7PM-7AM, please contact night-coverage [email or web address] Password [email or web address] 06/16/2020, 4:50 PM   LOS: 3 days

## 2020-06-16 NOTE — Care Management Important Message (Signed)
Important Message  Patient Details  Name: Donna Howe MRN: [id number] Date of Birth: 05/21/1956   Medicare Important Message Given:  Yes     [name] 06/16/2020, 1:40 PM

## 2020-06-17 ENCOUNTER — Inpatient Hospital Stay (HOSPITAL_COMMUNITY): Admission: RE | Admit: 2020-06-17 | Payer: Medicare HMO | Source: Ambulatory Visit

## 2020-06-17 ENCOUNTER — Other Ambulatory Visit (HOSPITAL_COMMUNITY): Payer: Medicare HMO

## 2020-06-17 ENCOUNTER — Encounter: Payer: Medicare HMO | Admitting: Vascular Surgery

## 2020-06-17 DIAGNOSIS — N179 Acute kidney failure, unspecified: Secondary | ICD-10-CM | POA: Diagnosis not present

## 2020-06-17 DIAGNOSIS — J9601 Acute respiratory failure with hypoxia: Secondary | ICD-10-CM | POA: Diagnosis not present

## 2020-06-17 DIAGNOSIS — I132 Hypertensive heart and chronic kidney disease with heart failure and with stage 5 chronic kidney disease, or end stage renal disease: Secondary | ICD-10-CM | POA: Diagnosis not present

## 2020-06-17 DIAGNOSIS — R042 Hemoptysis: Secondary | ICD-10-CM | POA: Diagnosis not present

## 2020-06-17 DIAGNOSIS — K219 Gastro-esophageal reflux disease without esophagitis: Secondary | ICD-10-CM

## 2020-06-17 DIAGNOSIS — J9811 Atelectasis: Secondary | ICD-10-CM | POA: Diagnosis not present

## 2020-06-17 DIAGNOSIS — I5032 Chronic diastolic (congestive) heart failure: Secondary | ICD-10-CM | POA: Diagnosis not present

## 2020-06-17 DIAGNOSIS — I214 Non-ST elevation (NSTEMI) myocardial infarction: Secondary | ICD-10-CM | POA: Diagnosis not present

## 2020-06-17 DIAGNOSIS — Z794 Long term (current) use of insulin: Secondary | ICD-10-CM | POA: Diagnosis not present

## 2020-06-17 DIAGNOSIS — J440 Chronic obstructive pulmonary disease with acute lower respiratory infection: Secondary | ICD-10-CM | POA: Diagnosis not present

## 2020-06-17 DIAGNOSIS — E1165 Type 2 diabetes mellitus with hyperglycemia: Secondary | ICD-10-CM | POA: Diagnosis not present

## 2020-06-17 DIAGNOSIS — N185 Chronic kidney disease, stage 5: Secondary | ICD-10-CM | POA: Diagnosis not present

## 2020-06-17 DIAGNOSIS — I5020 Unspecified systolic (congestive) heart failure: Secondary | ICD-10-CM

## 2020-06-17 DIAGNOSIS — E669 Obesity, unspecified: Secondary | ICD-10-CM

## 2020-06-17 DIAGNOSIS — I10 Essential (primary) hypertension: Secondary | ICD-10-CM | POA: Diagnosis not present

## 2020-06-17 DIAGNOSIS — Z20822 Contact with and (suspected) exposure to covid-19: Secondary | ICD-10-CM | POA: Diagnosis not present

## 2020-06-17 LAB — RENAL FUNCTION PANEL
Albumin: 2.9 g/dL — ABNORMAL LOW (ref 3.5–5.0)
Anion gap: 7 (ref 5–15)
BUN: 91 mg/dL — ABNORMAL HIGH (ref 8–23)
CO2: 24 mmol/L (ref 22–32)
Calcium: 8.4 mg/dL — ABNORMAL LOW (ref 8.9–10.3)
Chloride: 106 mmol/L (ref 98–111)
Creatinine, Ser: 4.43 mg/dL — ABNORMAL HIGH (ref 0.44–1.00)
GFR calc Af Amer: 12 mL/min — ABNORMAL LOW (ref >60)
GFR calc non Af Amer: 10 mL/min — ABNORMAL LOW (ref >60)
Glucose, Bld: 118 mg/dL — ABNORMAL HIGH (ref 70–99)
Phosphorus: 4 mg/dL (ref 2.5–4.6)
Potassium: 5.3 mmol/L — ABNORMAL HIGH (ref 3.5–5.1)
Sodium: 137 mmol/L (ref 135–145)

## 2020-06-17 LAB — GLUCOSE, CAPILLARY
Glucose-Capillary: 111 mg/dL — ABNORMAL HIGH (ref 70–99)
Glucose-Capillary: 182 mg/dL — ABNORMAL HIGH (ref 70–99)

## 2020-06-17 LAB — CBC
HCT: 26.6 % — ABNORMAL LOW (ref 36.0–46.0)
Hemoglobin: 8.7 g/dL — ABNORMAL LOW (ref 12.0–15.0)
MCH: 30.2 pg (ref 26.0–34.0)
MCHC: 32.7 g/dL (ref 30.0–36.0)
MCV: 92.4 fL (ref 80.0–100.0)
Platelets: 101 10*3/uL — ABNORMAL LOW (ref 150–400)
RBC: 2.88 MIL/uL — ABNORMAL LOW (ref 3.87–5.11)
RDW: 15 % (ref 11.5–15.5)
WBC: 9.7 10*3/uL (ref 4.0–10.5)
nRBC: 0 % (ref 0.0–0.2)

## 2020-06-17 MED ORDER — RANOLAZINE ER 500 MG PO TB12
500.0000 mg | ORAL_TABLET | Freq: Two times a day (BID) | ORAL | 1 refills | Status: DC
Start: 2020-06-17 — End: 2020-07-09

## 2020-06-17 MED ORDER — METOPROLOL TARTRATE 25 MG PO TABS
12.5000 mg | ORAL_TABLET | Freq: Two times a day (BID) | ORAL | 1 refills | Status: DC
Start: 2020-06-17 — End: 2020-06-30

## 2020-06-17 MED ORDER — PANTOPRAZOLE SODIUM 40 MG PO TBEC
40.0000 mg | DELAYED_RELEASE_TABLET | Freq: Two times a day (BID) | ORAL | 1 refills | Status: DC
Start: 2020-06-17 — End: 2021-09-21

## 2020-06-17 MED ORDER — GABAPENTIN 400 MG PO CAPS: 400.0000 mg | ORAL_CAPSULE | Freq: Two times a day (BID) | ORAL | Status: DC

## 2020-06-17 NOTE — Progress Notes (Addendum)
Progress Note  Patient Name: Donna Howe Date of Encounter: 06/17/2020  Primary Cardiologist: Rozann Lesches, MD   Subjective   Felt like she was "in a fog yesterday". Symptoms improved this morning. Still having intermittent episodes of left arm pain which occur at rest and spontaneously resolve within a few minutes. No exertional symptoms.   Inpatient Medications    Scheduled Meds: . aspirin EC  81 mg Oral Daily  . atorvastatin  80 mg Oral QPM  . budesonide (PULMICORT) nebulizer solution  0.5 mg Nebulization BID  . Chlorhexidine Gluconate Cloth  6 each Topical Daily  . cilostazol  100 mg Oral BID AC  . clopidogrel  75 mg Oral Daily  . famotidine  10 mg Oral Daily  . ferrous sulfate  325 mg Oral Q breakfast  . gabapentin  400 mg Oral BID  . heparin  5,000 Units Subcutaneous Q8H  . insulin aspart  0-20 Units Subcutaneous TID WC  . insulin aspart  0-5 Units Subcutaneous QHS  . insulin detemir  20 Units Subcutaneous QHS  . ipratropium-albuterol  3 mL Nebulization BID  . levothyroxine  125 mcg Oral Daily  . metoprolol tartrate  12.5 mg Oral BID  . pantoprazole  40 mg Oral Daily  . ranolazine  500 mg Oral BID  . sodium bicarbonate  650 mg Oral BID    PRN Meds: acetaminophen, albuterol, ALPRAZolam, alum & mag hydroxide-simeth, guaiFENesin-dextromethorphan, ondansetron   Vital Signs    Vitals:   06/16/20 2005 06/17/20 0602 06/17/20 0736 06/17/20 0742  BP: (!) 106/47 (!) 93/44 112/66   Pulse: 66 66 70   Resp: 18 20 20    Temp: 98.2 F (36.8 C) 98.6 F (37 C)    TempSrc: Axillary Oral    SpO2:  91% 92% 91%  Weight:      Height:        Intake/Output Summary (Last 24 hours) at 06/17/2020 0834 Last data filed at 06/16/2020 1850 Gross per 24 hour  Intake 480 ml  Output --  Net 480 ml    Last 3 Weights 06/15/2020 06/13/2020 06/12/2020  Weight (lbs) 236 lb 12.4 oz 224 lb 10.4 oz 218 lb  Weight (kg) 107.4 kg 101.9 kg 98.884 kg      Telemetry    NSR, HR in 60's  to 70's with occasional PVC's.  - Personally Reviewed  ECG    No new tracings.   Physical Exam   General: Well developed, well nourished, female appearing in no acute distress. Head: Normocephalic, atraumatic.  Neck: Supple without bruits, JVD not elevated. Lungs:  Resp regular and unlabored, mild expiratory wheeze along upper lung fields.  Heart: RRR, S1, S2, no S3, S4, 2/6 SEM along RUSB.  Abdomen: Soft, non-tender, non-distended with normoactive bowel sounds. No hepatomegaly. No rebound/guarding. No obvious abdominal masses. Extremities: No clubbing or cyanosis, 1+ pitting edema bilaterally. Distal pedal pulses are 2+ bilaterally. Neuro: Alert and oriented X 3. Moves all extremities spontaneously. Psych: Normal affect.  Labs    Chemistry Recent Labs  Lab 06/15/20 0544 06/16/20 0604 06/17/20 0551  NA 134*  134* 136 137  K 5.3*  5.3* 5.4* 5.3*  CL 105  104 106 106  CO2 21*  21* 23 24  GLUCOSE 254*  254* 125* 118*  BUN 90*  89* 92* 91*  CREATININE 4.60*  4.64* 4.50* 4.43*  CALCIUM 8.1*  8.1* 8.3* 8.4*  ALBUMIN 2.8* 2.9* 2.9*  GFRNONAA 10*  10* 10* 10*  GFRAA 11*  11* 11* 12*  ANIONGAP 8  9 7 7      Hematology Recent Labs  Lab 06/15/20 0544 06/16/20 0604 06/17/20 0551  WBC 9.0 10.0 9.7  RBC 2.85* 3.03* 2.88*  HGB 8.4* 8.9* 8.7*  HCT 26.7* 28.8* 26.6*  MCV 93.7 95.0 92.4  MCH 29.5 29.4 30.2  MCHC 31.5 30.9 32.7  RDW 14.6 15.2 15.0  PLT 120* 123* 101*    Cardiac EnzymesNo results for input(s): TROPONINI in the last 168 hours. No results for input(s): TROPIPOC in the last 168 hours.   BNPNo results for input(s): BNP, PROBNP in the last 168 hours.   DDimer No results for input(s): DDIMER in the last 168 hours.   Radiology    NM Pulmonary Perfusion  Result Date: 06/16/2020 CLINICAL DATA:  Chest pain and hemoptysis EXAM: NUCLEAR MEDICINE PERFUSION LUNG SCAN TECHNIQUE: Perfusion images were obtained in multiple projections after intravenous  injection of radiopharmaceutical. Views: Anterior, posterior, left lateral, right lateral, RPO, LPO, RAO, LAO RADIOPHARMACEUTICALS:  5.0 mCi Tc-6m MAA IV COMPARISON:  Chest radiograph June 12, 2020 FINDINGS: Radiotracer uptake is homogeneous and symmetric bilaterally. No perfusion defects evident. Heart noted to be prominent. IMPRESSION: No perfusion defects. No findings suggesting pulmonary embolus. Cardiac prominence noted. Electronically Signed   By: Lowella Grip III M.D.   On: 06/16/2020 13:09   US Venous Img Lower Bilateral (DVT)  Result Date: 06/16/2020 CLINICAL DATA:  Chest pain and hemoptysis EXAM: BILATERAL LOWER EXTREMITY VENOUS DOPPLER ULTRASOUND TECHNIQUE: Gray-scale sonography with graded compression, as well as color Doppler and duplex ultrasound were performed to evaluate the lower extremity deep venous systems from the level of the common femoral vein and including the common femoral, femoral, profunda femoral, popliteal and calf veins including the posterior tibial, peroneal and gastrocnemius veins when visible. The superficial great saphenous vein was also interrogated. Spectral Doppler was utilized to evaluate flow at rest and with distal augmentation maneuvers in the common femoral, femoral and popliteal veins. COMPARISON:  None. FINDINGS: RIGHT LOWER EXTREMITY Common Femoral Vein: No evidence of thrombus. Normal compressibility, respiratory phasicity and response to augmentation. Saphenofemoral Junction: No evidence of thrombus. Normal compressibility and flow on color Doppler imaging. Profunda Femoral Vein: No evidence of thrombus. Normal compressibility and flow on color Doppler imaging. Femoral Vein: No evidence of thrombus. Normal compressibility, respiratory phasicity and response to augmentation. Popliteal Vein: No evidence of thrombus. Normal compressibility, respiratory phasicity and response to augmentation. Calf Veins: No evidence of thrombus. Normal compressibility and  flow on color Doppler imaging. Superficial Great Saphenous Vein: No evidence of thrombus. Normal compressibility. Venous Reflux:  None. Other Findings:  Calf edema is noted. LEFT LOWER EXTREMITY Common Femoral Vein: No evidence of thrombus. Normal compressibility, respiratory phasicity and response to augmentation. Saphenofemoral Junction: No evidence of thrombus. Normal compressibility and flow on color Doppler imaging. Profunda Femoral Vein: No evidence of thrombus. Normal compressibility and flow on color Doppler imaging. Femoral Vein: No evidence of thrombus. Normal compressibility, respiratory phasicity and response to augmentation. Popliteal Vein: No evidence of thrombus. Normal compressibility, respiratory phasicity and response to augmentation. Calf Veins: No evidence of thrombus. Normal compressibility and flow on color Doppler imaging. Superficial Great Saphenous Vein: No evidence of thrombus. Normal compressibility. Venous Reflux:  None. Other Findings:  Calf edema is noted. IMPRESSION: No evidence of deep venous thrombosis in either lower extremity. Electronically Signed   By: Inez Catalina M.D.   On: 06/16/2020 14:45    Cardiac Studies   Echocardiogram: 06/13/2020 IMPRESSIONS  1. Left ventricular ejection fraction, by estimation, is 55 to 60%. The  left ventricle has normal function. The left ventricle has no regional  wall motion abnormalities. There is mild concentric left ventricular  hypertrophy. Left ventricular diastolic  parameters are consistent with Grade II diastolic dysfunction  (pseudonormalization). Elevated left ventricular end-diastolic pressure.  2. Right ventricular systolic function is normal. The right ventricular  size is normal. There is normal pulmonary artery systolic pressure.  3. Left atrial size was mildly dilated.  4. Right atrial size was mildly dilated.  5. The mitral valve is normal in structure. Trivial mitral valve  regurgitation. No evidence of  mitral stenosis.  6. The aortic valve is tricuspid. Aortic valve regurgitation is not  visualized. No aortic stenosis is present.  7. The inferior vena cava is normal in size with greater than 50%  respiratory variability, suggesting right atrial pressure of 3 mmHg.   Patient Profile     64 y.o. female w/ PMH of CAD (s/p CABG in 2005 with multiple PCI's since includingPTCA to SVG-OM1 in 01/2004,PTCA/DES to LM and prox LCxin 03/2004 and DES to SVG-OM1, PTCA/DES to SVG-OMin 01/2011,PTCA/DES to LM and LCxin 2015, andcutting balloon angioplasty to prox Cx and DES to SVG-RCAin 05/2017, NSTEMI in 03/2018 with DES to mid-RCA and DES to LIMA-LAD), HTN, HLD, Type 2 DM, carotid artery stenosis (s/p R CEA in 2014) and Stage 4-5 CKD who presented to [facility] ED on 06/12/20 for evaluation of chest pain. Medical therapy pursued given her advanced CKD.   Assessment & Plan    1. Chest Pain concerning for Unstable Angina - Reported episodes of chest pain and left shoulder pain prior to admission. HS Troponin values peaked at 160 and she was treated with IV Heparin for 48 hours. Cardiac catheterization has not been pursued given her advanced CKD and the high-risk of contrast induced nephropathy. Repeat echo shows a preserved EF of 55-60% with no regional WMA. - Continue ASA, Plavix and Lopressor 12.5mg  BID. She has been started on Ranexa 500mg  BID which can be further titrated as an outpatient. Was not started on Imdur this admission due to soft BP.    2. CAD - She is s/p CABG in 2005 with multiple PCI's since as outlined above with the most recent being DES to mid-RCA and DES to LIMA-LAD in 03/2018. - Continue ASA 81mg  daily, Plavix 75mg  daily Lipitor 80mg  daily, Lopressor 12.5mg  BID and Ranexa 500mg  BID. Of note, she is also on Pletal for presumed claudication. Will verify with Dr. Domenic Polite if to continue this as she is currently on triple therapy and given her anemia, may need to consider  discontinuation of Pletal (patient does not believe she was taking this prior to admission).   3. HLD - LDL at 67 this admission. Continue Atorvastatin 80mg  daily.   4. Stage 5 CKD - Nephrology following this admission. Creatinine peaked at 4.64, improved to 4.43 today. She does have 1+ pitting edema on examination today. Will defer possible diuretic dosing to Nephrology in the setting of her advanced CKD. Unable to use compression stockings in the past per her report.   5. Anemia - Repeat CBC pending for this AM. Hgb was at 8.9 on 06/16/2020. Consider discontinuation of Pletal as outlined above given the use of ASA and Plavix.    For questions or updates, please contact [place] Please consult [email or web address] for contact info under Cardiology/STEMI.   Signed, Erma Heritage , PA-C 8:34 AM 06/17/2020 Pager: [phone]  Attending note:  Patient seen and examined.  I reviewed interval hospital course and discussed the case with Ms. Ahmed Prima PA-C.  I agree with her above findings. Patient states that she feels better today, more alert.  Still has intermittent episodes of left arm discomfort, possibly multifactorial but angina certainly not excluded.  She states that she would like to go home today.  On examination she is afebrile, blood pressure 112/66, heart rate 70 and in sinus rhythm by telemetry which I personally reviewed.  Lungs are clear.  Cardiac exam with RRR no gallop.  Plan is to continue medical therapy for CAD and unstable angina symptoms.  She is at very high risk for contrast-induced nephropathy if cardiac catheterization is pursued and is in the process of undergoing evaluation for solidifying hemodialysis plan although does not have an access at this point.  Creatinine 4.43.  Continue aspirin, Plavix, Lipitor, Lopressor, and Ranexa.  Can stop Pletal.  Satira Sark, M.D., F.A.C.C.

## 2020-06-17 NOTE — Progress Notes (Signed)
Pt discharged via WC to POV with all personal belongings in her possession. 

## 2020-06-17 NOTE — Evaluation (Signed)
Physical Therapy Evaluation Patient Details Name: Donna Howe MRN: [id number] DOB: [birth year] Today's Date: 06/17/2020   History of Present Illness  Donna Howe  is a 64 y.o. female, with history of type 2 diabetes mellitus, thrombocytopenia, small bowel obstruction, CABG, peripheral artery disease, myocardial infarction, hypothyroidism, hyperlipidemia, essential hypertension, renal insufficiency, CAD, and more presents to the ED with a chief complaint of chest pain.  Patient reports that her chest pain is located in the center of her chest, the left side, even laterally to her breast, radiating down her left arm, and through to her back.  Patient reports the pain started 2 days ago.  It was initially improving with nitro over the past couple of days.  Today when she took her first nitro it helped the chest pain.  But when she took a second nitro it did not help.  She took these 2 doses 10 minutes apart.  Patient reports that her chest pain is worse with exertion especially ambulating.  Since the nitro drip has started patient reports that the pain has improved from a 7 out of 10 to a 5 out of 10.  Patient describes the pain as a squeezing pressure.  She says occasionally it throbs.  Patient has associated dyspnea, that she thought was due to bronchitis.  Patient has associated diaphoresis, and dizziness as well.  She denies palpitations or nausea.  Patient reports that this feels similar to when she had her CABG 16 years ago.  Patient reports her last stress test was about 2 years ago.  She reports that she has had 4-5 stents placed and the last one was 2 to 3 years ago.  Her last echo was on her last admission in January 2021.  Patient is on aspirin, statin, Plavix at home.  Patient reports that she has stage V kidney disease.  She has had decreased urine output, and only voided 3 times total yesterday.  Patient reports that she is getting ready to have a fistula placed for dialysis.  On review of  system patient agrees to congestion in her chest, a dry cough, but no fevers.  She reports that she is seen an outside provider for this and was prescribed Levaquin, Atrovent inhaler, prednisone, and Tessalon Perles.  She reports that she has been taking Levaquin for 3 days.  Patient reports that she has had weight gain of approximately 20 pounds.  She feels like her abdomen is bloated.  Patient has no other complaints at this time.    Clinical Impression  Patient functioning near baseline for functional mobility and gait, other than c/o mild chest pain with radiation down LUE, no loss of balance and tolerated sitting up at bedside after therapy - RN notified.  Plan:  Patient discharged from physical therapy to care of nursing for ambulation daily as tolerated for length of stay.     Follow Up Recommendations Supervision - Intermittent    Equipment Recommendations  None recommended by PT    Recommendations for Other Services       Precautions / Restrictions Precautions Precautions: None Restrictions Weight Bearing Restrictions: No      Mobility  Bed Mobility Overal bed mobility: Modified Independent             General bed mobility comments: slightly increased time with bed flat  Transfers Overall transfer level: Modified independent Equipment used: None Transfers: Sit to/from [facility] Sit to Stand: Modified independent (Device/Increase time) Stand pivot transfers: Modified independent (Device/Increase time)  General transfer comment: slightly increased time  Ambulation/Gait Ambulation/Gait assistance: Modified independent (Device/Increase time) Gait Distance (Feet): 80 Feet Assistive device: None Gait Pattern/deviations: WFL(Within Functional Limits) Gait velocity: decreased   General Gait Details: grossly WFL, slightly labored cadence without loss of balance, limited secondary to fatigue and mild chest discomfort with radiation down  LUE  Stairs            Wheelchair Mobility    Modified Rankin (Stroke Patients Only)       Balance Overall balance assessment: No apparent balance deficits (not formally assessed)                                           Pertinent Vitals/Pain Pain Assessment: Faces Pain Score: 2  Pain Location: chest and LUE after ambulation Pain Descriptors / Indicators: Radiating;Discomfort Pain Intervention(s): Limited activity within patient's tolerance;Monitored during session    Home Living Family/patient expects to be discharged to:: Private residence Living Arrangements: Alone Available Help at Discharge: Friend(s);Available PRN/intermittently Type of Home: Mobile home Home Access: Ramped entrance     Home Layout: One level Home Equipment: [place] - 2 wheels;Cane - single point;Bedside commode;Wheelchair - manual      Prior Function Level of Independence: Independent         Comments: household and short distanced community ambulator, drives     Journalist, newspaper        Extremity/Trunk Assessment   Upper Extremity Assessment Upper Extremity Assessment: Overall WFL for tasks assessed    Lower Extremity Assessment Lower Extremity Assessment: Overall WFL for tasks assessed    Cervical / Trunk Assessment Cervical / Trunk Assessment: Normal  Communication   Communication: No difficulties  Cognition Arousal/Alertness: Awake/alert Behavior During Therapy: WFL for tasks assessed/performed Overall Cognitive Status: Within Functional Limits for tasks assessed                                        General Comments      Exercises     Assessment/Plan    PT Assessment Patent does not need any further PT services  PT Problem List         PT Treatment Interventions      PT Goals (Current goals can be found in the Care Plan section)  Acute Rehab PT Goals Patient Stated Goal: return home with family to assist PT Goal  Formulation: With patient Time For Goal Achievement: 06/17/20 Potential to Achieve Goals: Good    Frequency     Barriers to discharge        Co-evaluation               AM-PAC PT "6 Clicks" Mobility  Outcome Measure Help needed turning from your back to your side while in a flat bed without using bedrails?: None Help needed moving from lying on your back to sitting on the side of a flat bed without using bedrails?: None Help needed moving to and from a bed to a chair (including a wheelchair)?: None Help needed standing up from a chair using your arms (e.g., wheelchair or bedside chair)?: None Help needed to walk in hospital room?: None Help needed climbing 3-5 steps with a railing? : A Little 6 Click Score: 23    End of Session   Activity Tolerance: Patient  tolerated treatment well;Patient limited by fatigue Patient left: in bed;with call bell/phone within reach Nurse Communication: Mobility status PT Visit Diagnosis: Unsteadiness on feet (R26.81);Other abnormalities of gait and mobility (R26.89);Muscle weakness (generalized) (M62.81)    Time: 4069-8614 PT Time Calculation (min) (ACUTE ONLY): 24 min   Charges:   PT Evaluation $PT Eval Moderate Complexity: 1 Mod PT Treatments $Therapeutic Activity: 23-37 mins        10:38 AM, 06/17/20 Lonell Grandchild, MPT Physical Therapist with [facility] [phone] office [phone] mobile phone

## 2020-06-17 NOTE — Discharge Summary (Signed)
Physician Discharge Summary  Donna Howe SJG:[id number] DOB: [birth year] DOA: 06/12/2020  PCP: [facility]  Admit date: 06/12/2020 Discharge date: 06/17/2020  Time spent: 35 minutes  Recommendations for Outpatient Follow-up:  1. Repeat basic metabolic panel to follow electrolytes and renal function and stability 2. Reassess blood pressure and adjust antihypertensive regimen as needed 3. Continue assisting patient with weight loss 4. Close monitoring of patient's CBGs with adjustment to hypoglycemic regimen as needed 5. Repeat thyroid panel in 3 months and adjust Synthroid dosage as required.   Discharge Diagnoses:  Active Problems:   Class 2 obesity   Atherosclerosis of native artery of extremity with intermittent claudication (HCC)   NSTEMI (non-ST elevated myocardial infarction) (HCC)   Angina pectoris (HCC)   Unstable angina (HCC)   Chest pain   Acute renal failure superimposed on stage 5 chronic kidney disease, not on chronic dialysis ([place])   Uncontrolled type 2 diabetes mellitus with hyperglycemia, with long-term current use of insulin (HCC)   Chronic diastolic HF (heart failure) (HCC)   Gastroesophageal reflux disease   Discharge Condition: Stable and improved overall.  Discharged with instructions to follow-up with PCP in 10 days and to follow-up with cardiology and nephrology service service as instructed.  CODE STATUS: Full code  Diet recommendation: Heart healthy, low calorie and modify carbohydrates diet.  Filed Weights   06/12/20 2105 06/13/20 1847 06/15/20 0500  Weight: 98.9 kg 101.9 kg 107.4 kg    History of present illness:  64 year old female with a history of diabetes mellitus type 2, hyperlipidemia, hypertension, CKD stage V, coronary artery diseasewith NSTEMI and DES, right carotid stenosis, peripheral arterial disease, and obesity presenting with chest pain. Patient states that she has had intermittent chest pain since 06/10/2020. She  states that she has been taking approximately 2 nitro on a daily basis that relieved her chest discomfort. She states that her chest discomfort usually last approximately 15 to 20 minutes. It is worse with exertion. On 06/12/2020, the patient took a second nitroglycerin without much relief of her chest discomfort. As result, EMS was activated. The patient states that the chest discomfort radiated down to her left arm. It feels like a throbbing sensation. She has some associated shortness of breath. She denies any fevers, chills, headache, nausea, vomiting, diarrhea, abdominal pain. The patient went to see her PCP on 06/09/2020. She was started on levofloxacin 750 mg daily and prednisone as well as Tessalon Perles for bronchitis. She denies any NSAIDs. In the emergency department, the patient was afebrile hemodynamically stable. Oxygen saturation was 91% on room air. She was placed on 2 L nasal cannula. BMP showed potassium 4.7, serum creatinine 4.77. WBC 12.4, hemoglobin 9.5, platelets 195,000. Chest x-ray showed right basal atelectasis and increased interstitial markings. The patient was started on IV heparin and IV nitroglycerin. Troponins 20>>>39>>160>>141  Hospital Course:  UnstableAngina/NSTEMI -Presently pain-free -Patient has completed 48 hours of IV heparin. -Echocardiogram--EF 55-60%, no WMA, grade 2 DD -Cardiology consultationappreciated -04/09/2018 heart catheterization--ost LAD-prox LAD 80%, ost 1st margina 80%, mid RCA 90%, LIMA-LAD 70%; DES x 2 placed -Continue aspirin and Plavix -Continue statin and low-dose beta-blocker. -Personally reviewed EKG--sinus rhythm, ST depression V4-V6 -LDL 67  Acute respiratory failure with hypoxia -initially with O2 sat in the upper 80s on RA -due to COPD and acute bronchitis -Condition has stabilized and at discharge patient with Good O2 sat on room air appreciated, no requiring oxygen supplementation.  Acute  bronchitis -continueduo nebs -continuepulmicort -COVID-19 negative -Respiratory viral panel negative -  Check procalcitonin<0.10 -Personally reviewed chest x-ray--RLLatelectasis, increased interstitial markings -Instructed to continue using flutter valve. -No need for antibiotics currently.  Acute on chronic renal failure--CKD stage V -Baseline creatinine 3.1-3.4 -Patient had recent blood work at [facility]8/20/2021 with serum creatinine 4.01 -Likely secondary to hemodynamic changes versus progression of underlying CKD -Gentle hydration--done--now euvolemic to hypervolemic -Patient follows Dr. Joelyn Oms in the outpatient setting -Continue cesarean bicarbonate -consultation by renal service--appreciated  Hemoptysis -?vigorous cough with IV heparin -V/Q scan, negative for PE. -Bilateral lower extremity Dopplers negative for DVT.  Diabetes mellitus type 2 with nephropathy -Resume home hypoglycemic regimen. -Follow CBGs and adjust hypoglycemic regimen as needed. -Hemoglobin A1c--6.2  Essential hypertension -Hydralazine and isosorbide has been discontinued secondary to low blood pressure -Startedlow-dose beta-blocker per cardiology service recommendation. -Amlodipine also discontinued -Follow heart healthy diet.  Hypothyroidism -Continue Synthroid -follow thyroid panel in 3 months.  Class II obesity -Body mass index is 38.22 kg/m. -Low calorie diet, portion control and increase physical activity discussed with patient.  Hyperlipidemia -Continue statin -Heart healthy diet encouraged.  Peripheral arterial disease -Continue Pletal  Anemia of CKD -drop in Hgb likely secondary to hemodilution. -FOBT negative. -baseline Hgb 10-11 -iron saturation 61%, ferritin 4 -Normal B12 and folate -IV iron and Epogen as per renal discretion. -No signs of overt bleeding.  Hyperkalemia -observe without tx for now per renal -Electrolytes stable -Continue to follow  trend. -Continue telemetry monitoring.  Procedures:  See below for x-ray reports.   Consultations:  Cardiology   Nephrology   Discharge Exam: Vitals:   06/17/20 0736 06/17/20 0742  BP: 112/66   Pulse: 70   Resp: 20   Temp:    SpO2: 92% 91%    General: Afebrile, still expressing some intermittent chest discomfort especially with activity; but no sustained.  No requiring oxygen supplementation denying any nausea vomiting.  Feeling ready to go home. Cardiovascular: S1 and S2, no rubs, no gallops, unable to properly assess JVD with body habitus. Respiratory: Good air movement bilaterally, no frank crackles or wheezing; no using accessory muscles. Abdomen: Obese, soft, nontender, nondistended, positive bowel sounds Extremities: Trace edema appreciated bilaterally; no cyanosis or clubbing.  Discharge Instructions   Discharge Instructions    (HEART FAILURE PATIENTS) Call MD:  Anytime you have any of the following symptoms: 1) 3 pound weight gain in 24 hours or 5 pounds in 1 week 2) shortness of breath, with or without a dry hacking cough 3) swelling in the hands, feet or stomach 4) if you have to sleep on extra pillows at night in order to breathe.   Complete by: As directed    Diet - low sodium heart healthy   Complete by: As directed    Discharge instructions   Complete by: As directed    Take medications as prescribed Maintain adequate hydration Follow-up with cardiology service as outpatient Patient was overall doing activity; follow low calorie diet. Change in weight on daily basis and follow low-sodium diet (less than 2 g daily). Follow-up with nephrology service after discharge.   Increase activity slowly   Complete by: As directed      Allergies as of 06/17/2020      Reactions   Penicillins Other (See Comments)   REACTION: Unknown, told as a child Has patient had a PCN reaction causing immediate rash, facial/tongue/throat swelling, SOB or lightheadedness with  hypotension: Unknown Has patient had a PCN reaction causing severe rash involving mucus membranes or skin necrosis: Unknown Has patient had a PCN reaction that required  hospitalization: Unknown Has patient had a PCN reaction occurring within the last 10 years: No If all of the above answers are "NO", then may proceed with Cephalosporin use.      Medication List    STOP taking these medications   amLODipine 10 MG tablet Commonly known as: NORVASC   cilostazol 100 MG tablet Commonly known as: PLETAL   colchicine 0.6 MG tablet   famotidine 20 MG tablet Commonly known as: Pepcid   hydrALAZINE 50 MG tablet Commonly known as: APRESOLINE   isosorbide dinitrate 5 MG tablet Commonly known as: ISORDIL   levofloxacin 750 MG tablet Commonly known as: LEVAQUIN   predniSONE 20 MG tablet Commonly known as: DELTASONE     TAKE these medications   Albuterol Sulfate 108 (90 Base) MCG/ACT Aepb Commonly known as: PROAIR RESPICLICK Inhale 2 puffs into the lungs every 4 (four) hours as needed (Shortness of breath).   ALPRAZolam 0.5 MG tablet Commonly known as: XANAX Take 0.25-0.5 mg by mouth See admin instructions. Take 0.25 mg tab by mouth every morning & evening as needed  and .05 mg tab at bedtime   aspirin EC 81 MG tablet Take 81 mg by mouth daily.   atorvastatin 80 MG tablet Commonly known as: LIPITOR Take 1 tablet (80 mg total) by mouth every evening.   benzonatate 100 MG capsule Commonly known as: TESSALON Take 100 mg by mouth 3 (three) times daily as needed for cough.   clopidogrel 75 MG tablet Commonly known as: PLAVIX Take 1 tablet (75 mg total) by mouth daily.   ferrous sulfate 325 (65 FE) MG tablet Take 325 mg by mouth daily with breakfast.   gabapentin 400 MG capsule Commonly known as: NEURONTIN Take 1 capsule (400 mg total) by mouth 2 (two) times daily.   ipratropium 17 MCG/ACT inhaler Commonly known as: ATROVENT HFA Inhale 2 puffs into the lungs See admin  instructions. Inhale 2 puffs every 4 to 6 hrs as needed.   levothyroxine 125 MCG tablet Commonly known as: SYNTHROID Take 125 mcg by mouth daily.   metoprolol tartrate 25 MG tablet Commonly known as: LOPRESSOR Take 0.5 tablets (12.5 mg total) by mouth 2 (two) times daily.   nitroGLYCERIN 0.4 MG SL tablet Commonly known as: NITROSTAT Place 0.4 mg under the tongue every 5 (five) minutes as needed for chest pain. Not to exceed 3 in 15 minute time frame   NovoLIN 70/30 ReliOn (70-30) 100 UNIT/ML injection Generic drug: insulin NPH-regular Human Inject 29 Units into the skin 2 (two) times daily with a meal. Am & PM   ondansetron 8 MG disintegrating tablet Commonly known as: Zofran ODT Take 1 tablet (8 mg total) by mouth every 8 (eight) hours as needed for nausea or vomiting.   pantoprazole 40 MG tablet Commonly known as: PROTONIX Take 1 tablet (40 mg total) by mouth 2 (two) times daily.   ranolazine 500 MG 12 hr tablet Commonly known as: RANEXA Take 1 tablet (500 mg total) by mouth 2 (two) times daily.   sodium bicarbonate 650 MG tablet Take 650 mg by mouth 2 (two) times daily.   Vitamin D (Ergocalciferol) 1.25 MG (50000 UNIT) Caps capsule Commonly known as: DRISDOL Take 50,000 Units by mouth once a week.      Allergies  Allergen Reactions  . Penicillins Other (See Comments)    REACTION: Unknown, told as a child Has patient had a PCN reaction causing immediate rash, facial/tongue/throat swelling, SOB or lightheadedness with hypotension: Unknown Has patient  had a PCN reaction causing severe rash involving mucus membranes or skin necrosis: Unknown Has patient had a PCN reaction that required hospitalization: Unknown Has patient had a PCN reaction occurring within the last 10 years: No If all of the above answers are "NO", then may proceed with Cephalosporin use.     Follow-up Information    Verta Ellen., NP Follow up on 07/09/2020.   Specialty: Cardiology Why:  Cardiology Hospital Follow-up on 07/09/2020 at 2:30 PM. Will be in the [facility] office. Please call if needing a different date or time.  Contact information: [place] [address] [phone]        [facility]. Schedule an appointment as soon as possible for a visit in 10 day(s).   Contact information: [place] [address] [phone]        Satira Sark, MD .   Specialty: Cardiology Contact information: [place] [address] [phone]               The results of significant diagnostics from this hospitalization (including imaging, microbiology, ancillary and laboratory) are listed below for reference.    Significant Diagnostic Studies: DG Chest 2 View  Result Date: 06/12/2020 CLINICAL DATA:  Chest pain and fatigue throughout the day. EXAM: CHEST - 2 VIEW COMPARISON:  06/11/2020 FINDINGS: Cardiac enlargement. No vascular congestion, edema, or consolidation. No pleural effusions. No pneumothorax. Mediastinal contours appear intact. Postoperative changes in the mediastinum. No significant changes since prior study. IMPRESSION: Cardiac enlargement.  No active pulmonary disease. Electronically Signed   By: Lucienne Capers M.D.   On: 06/12/2020 21:40   DG Abd 1 View  Result Date: 06/13/2020 CLINICAL DATA:  Abdominal bloating EXAM: ABDOMEN - 1 VIEW COMPARISON:  Radiograph 01/09/2010, chest radiograph 06/12/2020 FINDINGS: No high-grade obstructive bowel gas pattern is seen. There is a moderate colonic stool burden. About limited evaluation for free air on this supine only radiograph. No Rigler sign or other secondary features. Cholecystectomy clips in the right upper quadrant. Vascular calcium noted in the upper abdomen. Prior sternotomy and CABG as well as vascular stenting of the coronaries seen in the lung bases with some mild atelectatic change. No acute or suspicious osseous abnormalities. Degenerative  changes in the lumbar spine, hips and pelvis. IMPRESSION: 1. No high-grade obstructive bowel gas pattern. 2. Moderate colonic stool burden. 3. Prior cholecystectomy. 4. Bibasilar atelectasis. 5. Prior sternotomy and CABG. Electronically Signed   By: Lovena Le M.D.   On: 06/13/2020 02:30   NM Pulmonary Perfusion  Result Date: 06/16/2020 CLINICAL DATA:  Chest pain and hemoptysis EXAM: NUCLEAR MEDICINE PERFUSION LUNG SCAN TECHNIQUE: Perfusion images were obtained in multiple projections after intravenous injection of radiopharmaceutical. Views: Anterior, posterior, left lateral, right lateral, RPO, LPO, RAO, LAO RADIOPHARMACEUTICALS:  5.0 mCi Tc-15m MAA IV COMPARISON:  Chest radiograph June 12, 2020 FINDINGS: Radiotracer uptake is homogeneous and symmetric bilaterally. No perfusion defects evident. Heart noted to be prominent. IMPRESSION: No perfusion defects. No findings suggesting pulmonary embolus. Cardiac prominence noted. Electronically Signed   By: Lowella Grip III M.D.   On: 06/16/2020 13:09   US Venous Img Lower Bilateral (DVT)  Result Date: 06/16/2020 CLINICAL DATA:  Chest pain and hemoptysis EXAM: BILATERAL LOWER EXTREMITY VENOUS DOPPLER ULTRASOUND TECHNIQUE: Gray-scale sonography with graded compression, as well as color Doppler and duplex ultrasound were performed to evaluate the lower extremity deep venous systems from the level of the common femoral  vein and including the common femoral, femoral, profunda femoral, popliteal and calf veins including the posterior tibial, peroneal and gastrocnemius veins when visible. The superficial great saphenous vein was also interrogated. Spectral Doppler was utilized to evaluate flow at rest and with distal augmentation maneuvers in the common femoral, femoral and popliteal veins. COMPARISON:  None. FINDINGS: RIGHT LOWER EXTREMITY Common Femoral Vein: No evidence of thrombus. Normal compressibility, respiratory phasicity and response to  augmentation. Saphenofemoral Junction: No evidence of thrombus. Normal compressibility and flow on color Doppler imaging. Profunda Femoral Vein: No evidence of thrombus. Normal compressibility and flow on color Doppler imaging. Femoral Vein: No evidence of thrombus. Normal compressibility, respiratory phasicity and response to augmentation. Popliteal Vein: No evidence of thrombus. Normal compressibility, respiratory phasicity and response to augmentation. Calf Veins: No evidence of thrombus. Normal compressibility and flow on color Doppler imaging. Superficial Great Saphenous Vein: No evidence of thrombus. Normal compressibility. Venous Reflux:  None. Other Findings:  Calf edema is noted. LEFT LOWER EXTREMITY Common Femoral Vein: No evidence of thrombus. Normal compressibility, respiratory phasicity and response to augmentation. Saphenofemoral Junction: No evidence of thrombus. Normal compressibility and flow on color Doppler imaging. Profunda Femoral Vein: No evidence of thrombus. Normal compressibility and flow on color Doppler imaging. Femoral Vein: No evidence of thrombus. Normal compressibility, respiratory phasicity and response to augmentation. Popliteal Vein: No evidence of thrombus. Normal compressibility, respiratory phasicity and response to augmentation. Calf Veins: No evidence of thrombus. Normal compressibility and flow on color Doppler imaging. Superficial Great Saphenous Vein: No evidence of thrombus. Normal compressibility. Venous Reflux:  None. Other Findings:  Calf edema is noted. IMPRESSION: No evidence of deep venous thrombosis in either lower extremity. Electronically Signed   By: Inez Catalina M.D.   On: 06/16/2020 14:45   ECHOCARDIOGRAM COMPLETE  Result Date: 06/13/2020    ECHOCARDIOGRAM REPORT   Patient Name:   GUSSIE MURTON Date of Exam: 06/13/2020 Medical Rec #:  [id number]        Height:       66.0 in Accession #:    [id number]       Weight:       218.0 lb Date of Birth:  Mar 15, 1956        BSA:          2.074 m Patient Age:    64 years         BP:           136/61 mmHg Patient Gender: F                HR:           65 bpm. Exam Location:  [facility] Procedure: 2D Echo, Cardiac Doppler and Color Doppler Indications:    Chest pain  History:        Patient has prior history of Echocardiogram examinations, most                 recent 10/02/2019. CAD and Previous Myocardial Infarction, Prior                 CABG, PAD, Signs/Symptoms:Chest Pain; Risk Factors:Diabetes,                 Hypertension, Dyslipidemia and Current Smoker. CKD.  Sonographer:    Dustin Flock RDCS Referring Phys: [id number] ASIA B [place]  Sonographer Comments: Patient is morbidly obese. IMPRESSIONS  1. Left ventricular ejection fraction, by estimation, is 55 to 60%. The left ventricle has normal function. The left  ventricle has no regional wall motion abnormalities. There is mild concentric left ventricular hypertrophy. Left ventricular diastolic parameters are consistent with Grade II diastolic dysfunction (pseudonormalization). Elevated left ventricular end-diastolic pressure.  2. Right ventricular systolic function is normal. The right ventricular size is normal. There is normal pulmonary artery systolic pressure.  3. Left atrial size was mildly dilated.  4. Right atrial size was mildly dilated.  5. The mitral valve is normal in structure. Trivial mitral valve regurgitation. No evidence of mitral stenosis.  6. The aortic valve is tricuspid. Aortic valve regurgitation is not visualized. No aortic stenosis is present.  7. The inferior vena cava is normal in size with greater than 50% respiratory variability, suggesting right atrial pressure of 3 mmHg. FINDINGS  Left Ventricle: Left ventricular ejection fraction, by estimation, is 55 to 60%. The left ventricle has normal function. The left ventricle has no regional wall motion abnormalities. The left ventricular internal cavity size was normal in size. There is  mild  concentric left ventricular hypertrophy. Left ventricular diastolic parameters are consistent with Grade II diastolic dysfunction (pseudonormalization). Elevated left ventricular end-diastolic pressure. Right Ventricle: The right ventricular size is normal. No increase in right ventricular wall thickness. Right ventricular systolic function is normal. There is normal pulmonary artery systolic pressure. The tricuspid regurgitant velocity is 2.31 m/s, and  with an assumed right atrial pressure of 3 mmHg, the estimated right ventricular systolic pressure is 67.6 mmHg. Left Atrium: Left atrial size was mildly dilated. Right Atrium: Right atrial size was mildly dilated. Pericardium: There is no evidence of pericardial effusion. Mitral Valve: The mitral valve is normal in structure. Trivial mitral valve regurgitation. No evidence of mitral valve stenosis. Tricuspid Valve: The tricuspid valve is normal in structure. Tricuspid valve regurgitation is trivial. No evidence of tricuspid stenosis. Aortic Valve: The aortic valve is tricuspid. Aortic valve regurgitation is not visualized. No aortic stenosis is present. Pulmonic Valve: The pulmonic valve was normal in structure. Pulmonic valve regurgitation is not visualized. No evidence of pulmonic stenosis. Aorta: The aortic root is normal in size and structure. Venous: The inferior vena cava is normal in size with greater than 50% respiratory variability, suggesting right atrial pressure of 3 mmHg. IAS/Shunts: No atrial level shunt detected by color flow Doppler.  LEFT VENTRICLE PLAX 2D LVIDd:         5.18 cm  Diastology LVIDs:         3.64 cm  LV e' medial:    6.09 cm/s LV PW:         1.31 cm  LV E/e' medial:  19.5 LV IVS:        1.31 cm  LV e' lateral:   7.29 cm/s LVOT diam:     1.80 cm  LV E/e' lateral: 16.3 LV SV:         79 LV SV Index:   38 LVOT Area:     2.54 cm  RIGHT VENTRICLE RV Basal diam:  3.50 cm RV S prime:     7.72 cm/s TAPSE (M-mode): 2.5 cm LEFT ATRIUM              Index       RIGHT ATRIUM           Index LA diam:        3.70 cm 1.78 cm/m  RA Area:     19.70 cm LA Vol (A2C):   29.1 ml 14.03 ml/m RA Volume:   62.30 ml  30.03 ml/m LA  Vol (A4C):   67.1 ml 32.35 ml/m LA Biplane Vol: 48.3 ml 23.28 ml/m  AORTIC VALVE LVOT Vmax:   129.00 cm/s LVOT Vmean:  92.200 cm/s LVOT VTI:    0.312 m  AORTA Ao Root diam: 3.30 cm MITRAL VALVE                TRICUSPID VALVE MV Area (PHT): 2.71 cm     TR Peak grad:   21.3 mmHg MV Decel Time: 280 msec     TR Vmax:        231.00 cm/s MV E velocity: 119.00 cm/s MV A velocity: 104.00 cm/s  SHUNTS MV E/A ratio:  1.14         Systemic VTI:  0.31 m                             Systemic Diam: 1.80 cm Skeet Latch md Electronically signed by Skeet Latch md Signature Date/Time: 06/13/2020/12:43:41 PM    Final     Microbiology: Recent Results (from the past 240 hour(s))  Respiratory Panel by RT PCR (Flu A&B, Covid) - Nasopharyngeal Swab     Status: None   Collection Time: 06/12/20 10:57 PM   Specimen: Nasopharyngeal Swab  Result Value Ref Range Status   SARS Coronavirus 2 by RT PCR NEGATIVE NEGATIVE Final    Comment: (NOTE) SARS-CoV-2 target nucleic acids are NOT DETECTED.  The SARS-CoV-2 RNA is generally detectable in upper respiratoy specimens during the acute phase of infection. The lowest concentration of SARS-CoV-2 viral copies this assay can detect is 131 copies/mL. A negative result does not preclude SARS-Cov-2 infection and should not be used as the sole basis for treatment or other patient management decisions. A negative result may occur with  improper specimen collection/handling, submission of specimen other than nasopharyngeal swab, presence of viral mutation(s) within the areas targeted by this assay, and inadequate number of viral copies (<131 copies/mL). A negative result must be combined with clinical observations, patient history, and epidemiological information. The expected result is  Negative.  Fact Sheet for Patients:  PinkCheek.be  Fact Sheet for Healthcare Providers:  GravelBags.it  This test is no t yet approved or cleared by the [place] FDA and  has been authorized for detection and/or diagnosis of SARS-CoV-2 by FDA under an Emergency Use Authorization (EUA). This EUA will remain  in effect (meaning this test can be used) for the duration of the COVID-19 declaration under Section 564(b)(1) of the Act, 21 U.S.C. section 360bbb-3(b)(1), unless the authorization is terminated or revoked sooner.     Influenza A by PCR NEGATIVE NEGATIVE Final   Influenza B by PCR NEGATIVE NEGATIVE Final    Comment: (NOTE) The Xpert Xpress SARS-CoV-2/FLU/RSV assay is intended as an aid in  the diagnosis of influenza from Nasopharyngeal swab specimens and  should not be used as a sole basis for treatment. Nasal washings and  aspirates are unacceptable for Xpert Xpress SARS-CoV-2/FLU/RSV  testing.  Fact Sheet for Patients: PinkCheek.be  Fact Sheet for Healthcare Providers: GravelBags.it  This test is not yet approved or cleared by the [place] FDA and  has been authorized for detection and/or diagnosis of SARS-CoV-2 by  FDA under an Emergency Use Authorization (EUA). This EUA will remain  in effect (meaning this test can be used) for the duration of the  Covid-19 declaration under Section 564(b)(1) of the Act, 21  U.S.C. section 360bbb-3(b)(1), unless the authorization is  terminated or revoked. Performed at [facility], [address] [place], [place] [address]   MRSA PCR Screening     Status: None   Collection Time: 06/13/20  6:51 PM   Specimen: Nasopharyngeal  Result Value Ref Range Status   MRSA by PCR NEGATIVE NEGATIVE Final    Comment:        The GeneXpert MRSA Assay (FDA approved for NASAL specimens only), is one component of  a comprehensive MRSA colonization surveillance program. It is not intended to diagnose MRSA infection nor to guide or monitor treatment for MRSA infections. Performed at [facility], [address], [place], [place] [address]      Labs: Basic Metabolic Panel: Recent Labs  Lab 06/14/20 0717 06/14/20 0841 06/15/20 0544 06/16/20 0604 06/17/20 0551  NA 137 137 134*  134* 136 137  K 3.8 4.5 5.3*  5.3* 5.4* 5.3*  CL 98 105 105  104 106 106  CO2 32 22 21*  21* 23 24  GLUCOSE 176* 181* 254*  254* 125* 118*  BUN 19 81* 90*  89* 92* 91*  CREATININE 0.58 4.49* 4.60*  4.64* 4.50* 4.43*  CALCIUM 7.7* 8.1* 8.1*  8.1* 8.3* 8.4*  MG  --   --  2.1  --   --   PHOS 2.3* 5.7* 4.8* 4.1 4.0   Liver Function Tests: Recent Labs  Lab 06/14/20 0717 06/14/20 0841 06/15/20 0544 06/16/20 0604 06/17/20 0551  ALBUMIN 2.0* 2.9* 2.8* 2.9* 2.9*   CBC: Recent Labs  Lab 06/13/20 0601 06/14/20 0841 06/15/20 0544 06/16/20 0604 06/17/20 0551  WBC 11.0* 9.9 9.0 10.0 9.7  NEUTROABS  --  7.5  --   --   --   HGB 10.5* 9.8* 8.4* 8.9* 8.7*  HCT 33.1* 30.9* 26.7* 28.8* 26.6*  MCV 93.0 96.0 93.7 95.0 92.4  PLT 165 144* 120* 123* 101*   BNP (last 3 results) Recent Labs    12/17/19 1329  BNP 713.0*   CBG: Recent Labs  Lab 06/16/20 1116 06/16/20 1639 06/16/20 2132 06/17/20 0746 06/17/20 1110  GLUCAP 128* 144* 181* 111* 182*    Signed:  Barton Dubois MD.  [facility] 06/17/2020, 1:06 PM

## 2020-06-17 NOTE — Progress Notes (Addendum)
Nephrology Follow-Up Consult note   Assessment/Recommendations: Donna Howe is a/an 64 y.o. female with a past medical history significant for DM2, SBO, CAD s/p CABG and PCIs, PAD, HTN, HLD, CKD who present w/ chest pain and AKI  Non-Oliguric AKI on CKD IV/V: Baseline CKD (creatinine 4) related to biopsy-proven diabetic kidney disease being set up to initiate HD in the near future on the outpt side, considering PD versus HD. AKI here possibly related to hemodynamic changes/insults from unstable angina and intermittent hypotension. Kidney function fairly stable/slightly improved today -Gentle with HTN meds avoiding SBP < 100 as able -Continue sodium bicarb 650 mg twice daily -Continue to monitor for symptoms of uremia; no indications for dialysis at this time -Continue to monitor daily Cr, Dose meds for GFR -Monitor Daily I/Os, Daily weight  -Maintain MAP>65 for optimal renal perfusion as able.  -Avoid nephrotoxic medications including NSAIDs and Vanc/Zosyn combo  Hypertension: History of such with low blood pressures and management as above.  Anemia due to CKD: Hemoglobin 8.7.  Iron sat 60 and ferritin very low.  Would consider repeat iron testing in the outpatient setting.  Recent hemoglobin 13 earlier this month.  Continue to monitor and consider evaluation for occult GI bleed if hemoglobin continues to drop.  Otherwise can consider ESA in the outpatient setting  Uncontrolled Diabetes Mellitus Type 2 with Hyperglycemia: mgmt per primary  Hyperkalemia: Potassium 5.3.  Mild elevation likely related to chronic kidney disease.  Lokelma x1 yesterday.  No need today.  Unstable angina: Cardiology following.  Symptoms seem overall improved. Possible some component of GERD. CTM   Recommendations conveyed to primary service.    [facility] 06/17/2020 9:25 AM  ___________________________________________________________  CC: AKI on CKD V  Interval  History/Subjective: Patient states she is feeling better yesterday.  Describes yesterday as a fog.  Feels much more clear today.  Denies significant pain.  Appetite is fair.  Creatinine has improved to 4.4.  Hemoglobin steady at 8.7.  Blood pressure remains intermittently low.   Medications:  Current Facility-Administered Medications  Medication Dose Route Frequency Provider Last Rate Last Admin  . acetaminophen (TYLENOL) tablet 650 mg  650 mg Oral Q4H PRN Tat, David, MD      . albuterol (VENTOLIN HFA) 108 (90 Base) MCG/ACT inhaler 2 puff  2 puff Inhalation Q4H PRN Tat, David, MD      . ALPRAZolam Duanne Moron) tablet 0.25 mg  0.25 mg Oral BID PRN Orson Eva, MD   0.25 mg at 06/16/20 2133  . alum & mag hydroxide-simeth (MAALOX/MYLANTA) 200-200-20 MG/5ML suspension 30 mL  30 mL Oral Q4H PRN Orson Eva, MD   30 mL at 06/16/20 2134  . aspirin EC tablet 81 mg  81 mg Oral Daily Tat, David, MD   81 mg at 06/16/20 1200  . atorvastatin (LIPITOR) tablet 80 mg  80 mg Oral QPM Orson Eva, MD   80 mg at 06/16/20 1713  . budesonide (PULMICORT) nebulizer solution 0.5 mg  0.5 mg Nebulization BID Tat, David, MD   0.5 mg at 06/17/20 0742  . Chlorhexidine Gluconate Cloth 2 % PADS 6 each  6 each Topical Daily Tat, David, MD   6 each at 06/15/20 1038  . clopidogrel (PLAVIX) tablet 75 mg  75 mg Oral Daily Tat, David, MD   75 mg at 06/16/20 1200  . famotidine (PEPCID) tablet 10 mg  10 mg Oral Daily Tat, David, MD   10 mg at 06/16/20 1200  .  ferrous sulfate tablet 325 mg  325 mg Oral Q breakfast Tat, Shanon Brow, MD   325 mg at 06/16/20 0921  . gabapentin (NEURONTIN) capsule 400 mg  400 mg Oral BID Orson Eva, MD   400 mg at 06/16/20 2132  . guaiFENesin-dextromethorphan (ROBITUSSIN DM) 100-10 MG/5ML syrup 5 mL  5 mL Oral Q4H PRN Orson Eva, MD   5 mL at 06/15/20 2139  . heparin injection 5,000 Units  5,000 Units Subcutaneous Franco Collet, MD   5,000 Units at 06/16/20 2134  . insulin aspart (novoLOG) injection 0-20 Units  0-20  Units Subcutaneous TID WC Orson Eva, MD   3 Units at 06/16/20 1712  . insulin aspart (novoLOG) injection 0-5 Units  0-5 Units Subcutaneous Benay Pike, MD   4 Units at 06/14/20 2233  . insulin detemir (LEVEMIR) injection 20 Units  20 Units Subcutaneous Benay Pike, MD   20 Units at 06/16/20 2139  . ipratropium-albuterol (DUONEB) 0.5-2.5 (3) MG/3ML nebulizer solution 3 mL  3 mL Nebulization BID Tat, David, MD   3 mL at 06/17/20 0741  . levothyroxine (SYNTHROID) tablet 125 mcg  125 mcg Oral Daily Tat, Shanon Brow, MD   125 mcg at 06/16/20 [phone]  . metoprolol tartrate (LOPRESSOR) tablet 12.5 mg  12.5 mg Oral BID Tat, David, MD   12.5 mg at 06/16/20 1200  . ondansetron (ZOFRAN-ODT) disintegrating tablet 8 mg  8 mg Oral Q8H PRN Barton Dubois, MD   8 mg at 06/16/20 2133  . pantoprazole (PROTONIX) EC tablet 40 mg  40 mg Oral Daily Tat, David, MD   40 mg at 06/16/20 1200  . ranolazine (RANEXA) 12 hr tablet 500 mg  500 mg Oral BID Orson Eva, MD   500 mg at 06/16/20 2133  . sodium bicarbonate tablet 650 mg  650 mg Oral BID Orson Eva, MD   650 mg at 06/16/20 2132      Review of Systems: 10 systems reviewed and negative except per interval history/subjective  Physical Exam: Vitals:   06/17/20 0736 06/17/20 0742  BP: 112/66   Pulse: 70   Resp: 20   Temp:    SpO2: 92% 91%   No intake/output data recorded.  Intake/Output Summary (Last 24 hours) at 06/17/2020 0925 Last data filed at 06/16/2020 1850 Gross per 24 hour  Intake 480 ml  Output --  Net 480 ml    General: well-appearing, no acute distress CV: bradycardia, trace edema in the bilateral lower extremities Lungs: no iwob, bilateral chest rise Abd: soft, non-tender, non-distended Skin: no visible lesions or rashes Psych: alert, engaged, appropriate mood and affect Musculoskeletal: no obvious deformities Neuro: normal speech, no gross focal deficits     Test Results I personally reviewed new and old clinical labs and radiology  tests Lab Results  Component Value Date   NA 137 06/17/2020   K 5.3 (H) 06/17/2020   CL 106 06/17/2020   CO2 24 06/17/2020   BUN 91 (H) 06/17/2020   CREATININE 4.43 (H) 06/17/2020   CALCIUM 8.4 (L) 06/17/2020   ALBUMIN 2.9 (L) 06/17/2020   PHOS 4.0 06/17/2020

## 2020-06-18 ENCOUNTER — Other Ambulatory Visit: Payer: Self-pay

## 2020-06-18 ENCOUNTER — Encounter (HOSPITAL_COMMUNITY): Payer: Self-pay | Admitting: Emergency Medicine

## 2020-06-18 ENCOUNTER — Inpatient Hospital Stay (HOSPITAL_COMMUNITY)
Admission: EM | Admit: 2020-06-18 | Discharge: 2020-06-30 | DRG: 264 | Disposition: A | Discharge status: Home Health | Payer: Medicare HMO | Attending: Internal Medicine | Admitting: Internal Medicine

## 2020-06-18 ENCOUNTER — Emergency Department (HOSPITAL_COMMUNITY): Payer: Medicare HMO

## 2020-06-18 DIAGNOSIS — Y831 Surgical operation with implant of artificial internal device as the cause of abnormal reaction of the patient, or of later complication, without mention of misadventure at the time of the procedure: Secondary | ICD-10-CM | POA: Diagnosis present

## 2020-06-18 DIAGNOSIS — J45909 Unspecified asthma, uncomplicated: Secondary | ICD-10-CM | POA: Diagnosis present

## 2020-06-18 DIAGNOSIS — E1165 Type 2 diabetes mellitus with hyperglycemia: Secondary | ICD-10-CM | POA: Diagnosis present

## 2020-06-18 DIAGNOSIS — E876 Hypokalemia: Secondary | ICD-10-CM | POA: Diagnosis not present

## 2020-06-18 DIAGNOSIS — I959 Hypotension, unspecified: Secondary | ICD-10-CM | POA: Diagnosis not present

## 2020-06-18 DIAGNOSIS — E669 Obesity, unspecified: Secondary | ICD-10-CM | POA: Diagnosis not present

## 2020-06-18 DIAGNOSIS — I25700 Atherosclerosis of coronary artery bypass graft(s), unspecified, with unstable angina pectoris: Secondary | ICD-10-CM | POA: Diagnosis not present

## 2020-06-18 DIAGNOSIS — E785 Hyperlipidemia, unspecified: Secondary | ICD-10-CM | POA: Diagnosis present

## 2020-06-18 DIAGNOSIS — I4510 Unspecified right bundle-branch block: Secondary | ICD-10-CM | POA: Diagnosis present

## 2020-06-18 DIAGNOSIS — D631 Anemia in chronic kidney disease: Secondary | ICD-10-CM | POA: Diagnosis present

## 2020-06-18 DIAGNOSIS — Z4901 Encounter for fitting and adjustment of extracorporeal dialysis catheter: Secondary | ICD-10-CM | POA: Diagnosis not present

## 2020-06-18 DIAGNOSIS — Z87891 Personal history of nicotine dependence: Secondary | ICD-10-CM | POA: Diagnosis not present

## 2020-06-18 DIAGNOSIS — Z880 Allergy status to penicillin: Secondary | ICD-10-CM

## 2020-06-18 DIAGNOSIS — I132 Hypertensive heart and chronic kidney disease with heart failure and with stage 5 chronic kidney disease, or end stage renal disease: Secondary | ICD-10-CM | POA: Diagnosis not present

## 2020-06-18 DIAGNOSIS — R400 Somnolence: Secondary | ICD-10-CM | POA: Diagnosis not present

## 2020-06-18 DIAGNOSIS — T82855A Stenosis of coronary artery stent, initial encounter: Secondary | ICD-10-CM | POA: Diagnosis present

## 2020-06-18 DIAGNOSIS — E1151 Type 2 diabetes mellitus with diabetic peripheral angiopathy without gangrene: Secondary | ICD-10-CM | POA: Diagnosis present

## 2020-06-18 DIAGNOSIS — R001 Bradycardia, unspecified: Secondary | ICD-10-CM | POA: Diagnosis present

## 2020-06-18 DIAGNOSIS — Z955 Presence of coronary angioplasty implant and graft: Secondary | ICD-10-CM

## 2020-06-18 DIAGNOSIS — Z833 Family history of diabetes mellitus: Secondary | ICD-10-CM

## 2020-06-18 DIAGNOSIS — E1129 Type 2 diabetes mellitus with other diabetic kidney complication: Secondary | ICD-10-CM | POA: Diagnosis present

## 2020-06-18 DIAGNOSIS — Z20822 Contact with and (suspected) exposure to covid-19: Secondary | ICD-10-CM | POA: Diagnosis present

## 2020-06-18 DIAGNOSIS — Z7902 Long term (current) use of antithrombotics/antiplatelets: Secondary | ICD-10-CM

## 2020-06-18 DIAGNOSIS — N178 Other acute kidney failure: Secondary | ICD-10-CM | POA: Diagnosis not present

## 2020-06-18 DIAGNOSIS — I214 Non-ST elevation (NSTEMI) myocardial infarction: Secondary | ICD-10-CM | POA: Diagnosis not present

## 2020-06-18 DIAGNOSIS — K219 Gastro-esophageal reflux disease without esophagitis: Secondary | ICD-10-CM | POA: Diagnosis present

## 2020-06-18 DIAGNOSIS — I6521 Occlusion and stenosis of right carotid artery: Secondary | ICD-10-CM | POA: Diagnosis present

## 2020-06-18 DIAGNOSIS — R4182 Altered mental status, unspecified: Secondary | ICD-10-CM | POA: Diagnosis not present

## 2020-06-18 DIAGNOSIS — R4781 Slurred speech: Secondary | ICD-10-CM | POA: Diagnosis present

## 2020-06-18 DIAGNOSIS — R079 Chest pain, unspecified: Secondary | ICD-10-CM | POA: Diagnosis not present

## 2020-06-18 DIAGNOSIS — I120 Hypertensive chronic kidney disease with stage 5 chronic kidney disease or end stage renal disease: Secondary | ICD-10-CM | POA: Diagnosis not present

## 2020-06-18 DIAGNOSIS — I5032 Chronic diastolic (congestive) heart failure: Secondary | ICD-10-CM | POA: Diagnosis not present

## 2020-06-18 DIAGNOSIS — E875 Hyperkalemia: Secondary | ICD-10-CM | POA: Diagnosis present

## 2020-06-18 DIAGNOSIS — I2510 Atherosclerotic heart disease of native coronary artery without angina pectoris: Secondary | ICD-10-CM | POA: Diagnosis present

## 2020-06-18 DIAGNOSIS — N185 Chronic kidney disease, stage 5: Secondary | ICD-10-CM | POA: Diagnosis not present

## 2020-06-18 DIAGNOSIS — D696 Thrombocytopenia, unspecified: Secondary | ICD-10-CM | POA: Diagnosis present

## 2020-06-18 DIAGNOSIS — I6523 Occlusion and stenosis of bilateral carotid arteries: Secondary | ICD-10-CM | POA: Diagnosis not present

## 2020-06-18 DIAGNOSIS — Z951 Presence of aortocoronary bypass graft: Secondary | ICD-10-CM

## 2020-06-18 DIAGNOSIS — Z6838 Body mass index (BMI) 38.0-38.9, adult: Secondary | ICD-10-CM

## 2020-06-18 DIAGNOSIS — I70219 Atherosclerosis of native arteries of extremities with intermittent claudication, unspecified extremity: Secondary | ICD-10-CM | POA: Diagnosis present

## 2020-06-18 DIAGNOSIS — R531 Weakness: Secondary | ICD-10-CM

## 2020-06-18 DIAGNOSIS — R609 Edema, unspecified: Secondary | ICD-10-CM

## 2020-06-18 DIAGNOSIS — T380X5A Adverse effect of glucocorticoids and synthetic analogues, initial encounter: Secondary | ICD-10-CM | POA: Diagnosis present

## 2020-06-18 DIAGNOSIS — I340 Nonrheumatic mitral (valve) insufficiency: Secondary | ICD-10-CM | POA: Diagnosis not present

## 2020-06-18 DIAGNOSIS — Z8249 Family history of ischemic heart disease and other diseases of the circulatory system: Secondary | ICD-10-CM

## 2020-06-18 DIAGNOSIS — Z83438 Family history of other disorder of lipoprotein metabolism and other lipidemia: Secondary | ICD-10-CM

## 2020-06-18 DIAGNOSIS — M109 Gout, unspecified: Secondary | ICD-10-CM | POA: Diagnosis present

## 2020-06-18 DIAGNOSIS — I25810 Atherosclerosis of coronary artery bypass graft(s) without angina pectoris: Secondary | ICD-10-CM | POA: Diagnosis not present

## 2020-06-18 DIAGNOSIS — Z79899 Other long term (current) drug therapy: Secondary | ICD-10-CM

## 2020-06-18 DIAGNOSIS — N184 Chronic kidney disease, stage 4 (severe): Secondary | ICD-10-CM | POA: Diagnosis not present

## 2020-06-18 DIAGNOSIS — E1122 Type 2 diabetes mellitus with diabetic chronic kidney disease: Secondary | ICD-10-CM | POA: Diagnosis present

## 2020-06-18 DIAGNOSIS — D509 Iron deficiency anemia, unspecified: Secondary | ICD-10-CM | POA: Diagnosis present

## 2020-06-18 DIAGNOSIS — I5033 Acute on chronic diastolic (congestive) heart failure: Secondary | ICD-10-CM | POA: Diagnosis not present

## 2020-06-18 DIAGNOSIS — I25119 Atherosclerotic heart disease of native coronary artery with unspecified angina pectoris: Secondary | ICD-10-CM | POA: Diagnosis not present

## 2020-06-18 DIAGNOSIS — I779 Disorder of arteries and arterioles, unspecified: Secondary | ICD-10-CM | POA: Diagnosis present

## 2020-06-18 DIAGNOSIS — N179 Acute kidney failure, unspecified: Secondary | ICD-10-CM | POA: Diagnosis present

## 2020-06-18 DIAGNOSIS — Z7982 Long term (current) use of aspirin: Secondary | ICD-10-CM

## 2020-06-18 DIAGNOSIS — Z79890 Hormone replacement therapy (postmenopausal): Secondary | ICD-10-CM

## 2020-06-18 DIAGNOSIS — Z992 Dependence on renal dialysis: Secondary | ICD-10-CM | POA: Diagnosis not present

## 2020-06-18 DIAGNOSIS — I10 Essential (primary) hypertension: Secondary | ICD-10-CM | POA: Diagnosis not present

## 2020-06-18 DIAGNOSIS — I25110 Atherosclerotic heart disease of native coronary artery with unstable angina pectoris: Secondary | ICD-10-CM | POA: Diagnosis not present

## 2020-06-18 DIAGNOSIS — D649 Anemia, unspecified: Secondary | ICD-10-CM | POA: Diagnosis present

## 2020-06-18 DIAGNOSIS — N186 End stage renal disease: Secondary | ICD-10-CM | POA: Diagnosis not present

## 2020-06-18 DIAGNOSIS — Z794 Long term (current) use of insulin: Secondary | ICD-10-CM | POA: Diagnosis not present

## 2020-06-18 DIAGNOSIS — Z8349 Family history of other endocrine, nutritional and metabolic diseases: Secondary | ICD-10-CM

## 2020-06-18 DIAGNOSIS — E039 Hypothyroidism, unspecified: Secondary | ICD-10-CM | POA: Diagnosis present

## 2020-06-18 DIAGNOSIS — R0602 Shortness of breath: Secondary | ICD-10-CM | POA: Diagnosis not present

## 2020-06-18 DIAGNOSIS — F419 Anxiety disorder, unspecified: Secondary | ICD-10-CM | POA: Diagnosis present

## 2020-06-18 DIAGNOSIS — Z9049 Acquired absence of other specified parts of digestive tract: Secondary | ICD-10-CM | POA: Diagnosis not present

## 2020-06-18 DIAGNOSIS — I5020 Unspecified systolic (congestive) heart failure: Secondary | ICD-10-CM | POA: Diagnosis present

## 2020-06-18 DIAGNOSIS — J90 Pleural effusion, not elsewhere classified: Secondary | ICD-10-CM | POA: Diagnosis not present

## 2020-06-18 LAB — GLUCOSE, CAPILLARY
Glucose-Capillary: 255 mg/dL — ABNORMAL HIGH (ref 70–99)
Glucose-Capillary: 271 mg/dL — ABNORMAL HIGH (ref 70–99)

## 2020-06-18 LAB — CBC WITH DIFFERENTIAL/PLATELET
Abs Immature Granulocytes: 0.3 10*3/uL — ABNORMAL HIGH (ref 0.00–0.07)
Basophils Absolute: 0 10*3/uL (ref 0.0–0.1)
Basophils Relative: 0 %
Eosinophils Absolute: 0 10*3/uL (ref 0.0–0.5)
Eosinophils Relative: 0 %
HCT: 27.5 % — ABNORMAL LOW (ref 36.0–46.0)
Hemoglobin: 9.2 g/dL — ABNORMAL LOW (ref 12.0–15.0)
Immature Granulocytes: 3 %
Lymphocytes Relative: 7 %
Lymphs Abs: 0.8 10*3/uL (ref 0.7–4.0)
MCH: 30.7 pg (ref 26.0–34.0)
MCHC: 33.5 g/dL (ref 30.0–36.0)
MCV: 91.7 fL (ref 80.0–100.0)
Monocytes Absolute: 0.7 10*3/uL (ref 0.1–1.0)
Monocytes Relative: 6 %
Neutro Abs: 9.7 10*3/uL — ABNORMAL HIGH (ref 1.7–7.7)
Neutrophils Relative %: 84 %
Platelets: 88 10*3/uL — ABNORMAL LOW (ref 150–400)
RBC: 3 MIL/uL — ABNORMAL LOW (ref 3.87–5.11)
RDW: 14.9 % (ref 11.5–15.5)
WBC: 11.5 10*3/uL — ABNORMAL HIGH (ref 4.0–10.5)
nRBC: 0 % (ref 0.0–0.2)

## 2020-06-18 LAB — URINALYSIS, ROUTINE W REFLEX MICROSCOPIC
Bilirubin Urine: NEGATIVE
Glucose, UA: 150 mg/dL — AB
Hgb urine dipstick: NEGATIVE
Ketones, ur: NEGATIVE mg/dL
Leukocytes,Ua: NEGATIVE
Nitrite: NEGATIVE
Protein, ur: >=300 mg/dL — AB
Specific Gravity, Urine: 1.015 (ref 1.005–1.030)
pH: 5 (ref 5.0–8.0)

## 2020-06-18 LAB — BASIC METABOLIC PANEL
Anion gap: 11 (ref 5–15)
Anion gap: 12 (ref 5–15)
BUN: 106 mg/dL — ABNORMAL HIGH (ref 8–23)
BUN: 110 mg/dL — ABNORMAL HIGH (ref 8–23)
CO2: 20 mmol/L — ABNORMAL LOW (ref 22–32)
CO2: 19 mmol/L — ABNORMAL LOW (ref 22–32)
Calcium: 8.6 mg/dL — ABNORMAL LOW (ref 8.9–10.3)
Calcium: 8.5 mg/dL — ABNORMAL LOW (ref 8.9–10.3)
Chloride: 101 mmol/L (ref 98–111)
Chloride: 102 mmol/L (ref 98–111)
Creatinine, Ser: 5.18 mg/dL — ABNORMAL HIGH (ref 0.44–1.00)
Creatinine, Ser: 5.13 mg/dL — ABNORMAL HIGH (ref 0.44–1.00)
GFR calc Af Amer: 10 mL/min — ABNORMAL LOW (ref >60)
GFR calc Af Amer: 10 mL/min — ABNORMAL LOW (ref >60)
GFR calc non Af Amer: 8 mL/min — ABNORMAL LOW (ref >60)
GFR calc non Af Amer: 8 mL/min — ABNORMAL LOW (ref >60)
Glucose, Bld: 306 mg/dL — ABNORMAL HIGH (ref 70–99)
Glucose, Bld: 304 mg/dL — ABNORMAL HIGH (ref 70–99)
Potassium: 6.7 mmol/L (ref 3.5–5.1)
Potassium: 6.7 mmol/L (ref 3.5–5.1)
Sodium: 132 mmol/L — ABNORMAL LOW (ref 135–145)
Sodium: 133 mmol/L — ABNORMAL LOW (ref 135–145)

## 2020-06-18 LAB — TSH: TSH: 1.057 u[IU]/mL (ref 0.350–4.500)

## 2020-06-18 LAB — RESPIRATORY PANEL BY RT PCR (FLU A&B, COVID)
Influenza A by PCR: NEGATIVE
Influenza B by PCR: NEGATIVE
SARS Coronavirus 2 by RT PCR: NEGATIVE

## 2020-06-18 LAB — BRAIN NATRIURETIC PEPTIDE: B Natriuretic Peptide: 2129 pg/mL — ABNORMAL HIGH (ref 0.0–100.0)

## 2020-06-18 LAB — MAGNESIUM: Magnesium: 2.6 mg/dL — ABNORMAL HIGH (ref 1.7–2.4)

## 2020-06-18 LAB — TROPONIN I (HIGH SENSITIVITY): Troponin I (High Sensitivity): 482 ng/L (ref <18)

## 2020-06-18 MED ORDER — FUROSEMIDE 10 MG/ML IJ SOLN: 40.0000 mg | Freq: Once | INTRAMUSCULAR | Status: DC

## 2020-06-18 MED ORDER — LEVOTHYROXINE SODIUM 25 MCG PO TABS
125.0000 ug | ORAL_TABLET | Freq: Every day | ORAL | Status: DC
  Administered 2020-06-19 – 2020-06-30 (×11): 125 ug via ORAL
  Filled 2020-06-18 (×19): qty 1

## 2020-06-18 MED ORDER — SODIUM BICARBONATE 8.4 % IV SOLN
50.0000 meq | Freq: Once | INTRAVENOUS | Status: AC
  Administered 2020-06-19: 50 meq via INTRAVENOUS
  Filled 2020-06-18: qty 50

## 2020-06-18 MED ORDER — INSULIN ASPART 100 UNIT/ML ~~LOC~~ SOLN
0.0000 [IU] | Freq: Every day | SUBCUTANEOUS | Status: DC
  Administered 2020-06-20 – 2020-06-26 (×3): 2 [IU] via SUBCUTANEOUS
  Administered 2020-06-27: 5 [IU] via SUBCUTANEOUS
  Administered 2020-06-28: 2 [IU] via SUBCUTANEOUS

## 2020-06-18 MED ORDER — CALCIUM GLUCONATE 10 % IV SOLN
1.0000 g | Freq: Once | INTRAVENOUS | Status: DC
  Filled 2020-06-18: qty 10

## 2020-06-18 MED ORDER — ALPRAZOLAM 0.25 MG PO TABS: 0.2500 mg | ORAL_TABLET | ORAL | Status: DC

## 2020-06-18 MED ORDER — ASPIRIN EC 81 MG PO TBEC
81.0000 mg | DELAYED_RELEASE_TABLET | Freq: Every day | ORAL | Status: DC
  Administered 2020-06-19 – 2020-06-30 (×12): 81 mg via ORAL
  Filled 2020-06-18 (×13): qty 1

## 2020-06-18 MED ORDER — SODIUM BICARBONATE 8.4 % IV SOLN: 50.0000 meq | Freq: Once | INTRAVENOUS | Status: AC

## 2020-06-18 MED ORDER — ALBUTEROL SULFATE (2.5 MG/3ML) 0.083% IN NEBU: 2.5000 mg | INHALATION_SOLUTION | RESPIRATORY_TRACT | Status: DC | PRN

## 2020-06-18 MED ORDER — DEXTROSE 50 % IV SOLN: 1.0000 | Freq: Once | INTRAVENOUS | Status: DC

## 2020-06-18 MED ORDER — ALBUTEROL SULFATE (2.5 MG/3ML) 0.083% IN NEBU
10.0000 mg | INHALATION_SOLUTION | Freq: Once | RESPIRATORY_TRACT | Status: AC
  Administered 2020-06-18: 10 mg via RESPIRATORY_TRACT
  Filled 2020-06-18: qty 12

## 2020-06-18 MED ORDER — INSULIN ASPART 100 UNIT/ML IV SOLN: 10.0000 [IU] | Freq: Once | INTRAVENOUS | Status: DC

## 2020-06-18 MED ORDER — SODIUM BICARBONATE 650 MG PO TABS
650.0000 mg | ORAL_TABLET | Freq: Two times a day (BID) | ORAL | Status: DC
  Administered 2020-06-18 – 2020-06-23 (×10): 650 mg via ORAL
  Filled 2020-06-18 (×11): qty 1

## 2020-06-18 MED ORDER — ATROPINE SULFATE 1 MG/10ML IJ SOSY
0.5000 mg | PREFILLED_SYRINGE | Freq: Once | INTRAMUSCULAR | Status: AC
  Administered 2020-06-18: 0.5 mg via INTRAVENOUS
  Filled 2020-06-18: qty 10

## 2020-06-18 MED ORDER — FERROUS SULFATE 325 (65 FE) MG PO TABS
325.0000 mg | ORAL_TABLET | Freq: Every day | ORAL | Status: DC
  Administered 2020-06-19 – 2020-06-24 (×5): 325 mg via ORAL
  Filled 2020-06-18 (×5): qty 1

## 2020-06-18 MED ORDER — INSULIN GLARGINE 100 UNIT/ML ~~LOC~~ SOLN
10.0000 [IU] | Freq: Every day | SUBCUTANEOUS | Status: DC
  Administered 2020-06-20 – 2020-06-27 (×8): 10 [IU] via SUBCUTANEOUS
  Filled 2020-06-18 (×12): qty 0.1

## 2020-06-18 MED ORDER — SODIUM POLYSTYRENE SULFONATE 15 GM/60ML PO SUSP
30.0000 g | Freq: Once | ORAL | Status: AC
  Administered 2020-06-18: 30 g via ORAL
  Filled 2020-06-18: qty 120

## 2020-06-18 MED ORDER — RANOLAZINE ER 500 MG PO TB12
500.0000 mg | ORAL_TABLET | Freq: Two times a day (BID) | ORAL | Status: DC
  Administered 2020-06-18: 500 mg via ORAL
  Filled 2020-06-18: qty 1

## 2020-06-18 MED ORDER — SODIUM CHLORIDE 0.9 % IV SOLN
1.0000 g | Freq: Once | INTRAVENOUS | Status: DC
  Filled 2020-06-18: qty 10

## 2020-06-18 MED ORDER — INSULIN ASPART 100 UNIT/ML ~~LOC~~ SOLN
0.0000 [IU] | Freq: Three times a day (TID) | SUBCUTANEOUS | Status: DC
  Administered 2020-06-19: 2 [IU] via SUBCUTANEOUS
  Administered 2020-06-19: 5 [IU] via SUBCUTANEOUS
  Administered 2020-06-19 – 2020-06-20 (×3): 2 [IU] via SUBCUTANEOUS
  Administered 2020-06-21: 3 [IU] via SUBCUTANEOUS
  Administered 2020-06-21 (×2): 2 [IU] via SUBCUTANEOUS
  Administered 2020-06-22: 1 [IU] via SUBCUTANEOUS
  Administered 2020-06-22 – 2020-06-23 (×2): 2 [IU] via SUBCUTANEOUS
  Administered 2020-06-24 (×3): 3 [IU] via SUBCUTANEOUS
  Administered 2020-06-25: 2 [IU] via SUBCUTANEOUS
  Administered 2020-06-25: 5 [IU] via SUBCUTANEOUS
  Administered 2020-06-25 – 2020-06-26 (×2): 2 [IU] via SUBCUTANEOUS
  Administered 2020-06-26: 5 [IU] via SUBCUTANEOUS
  Administered 2020-06-26 – 2020-06-27 (×3): 3 [IU] via SUBCUTANEOUS

## 2020-06-18 MED ORDER — FUROSEMIDE 10 MG/ML IJ SOLN
80.0000 mg | Freq: Once | INTRAMUSCULAR | Status: AC
  Administered 2020-06-18: 80 mg via INTRAVENOUS
  Filled 2020-06-18: qty 8

## 2020-06-18 MED ORDER — NITROGLYCERIN 0.4 MG SL SUBL
0.4000 mg | SUBLINGUAL_TABLET | SUBLINGUAL | Status: DC | PRN
  Administered 2020-06-19 – 2020-06-22 (×4): 0.4 mg via SUBLINGUAL
  Filled 2020-06-18 (×4): qty 1

## 2020-06-18 MED ORDER — CALCIUM GLUCONATE-NACL 1-0.675 GM/50ML-% IV SOLN
1.0000 g | Freq: Once | INTRAVENOUS | Status: AC
  Administered 2020-06-18: 1000 mg via INTRAVENOUS
  Filled 2020-06-18: qty 50

## 2020-06-18 MED ORDER — INSULIN ASPART 100 UNIT/ML IV SOLN
5.0000 [IU] | Freq: Once | INTRAVENOUS | Status: AC
  Administered 2020-06-18: 5 [IU] via INTRAVENOUS

## 2020-06-18 MED ORDER — ACETAMINOPHEN 650 MG RE SUPP: 650.0000 mg | Freq: Four times a day (QID) | RECTAL | Status: DC | PRN

## 2020-06-18 MED ORDER — VITAMIN D (ERGOCALCIFEROL) 1.25 MG (50000 UNIT) PO CAPS
50000.0000 [IU] | ORAL_CAPSULE | ORAL | Status: DC
  Administered 2020-06-21 – 2020-06-28 (×2): 50000 [IU] via ORAL
  Filled 2020-06-18 (×2): qty 1

## 2020-06-18 MED ORDER — SODIUM POLYSTYRENE SULFONATE 15 GM/60ML PO SUSP
15.0000 g | Freq: Once | ORAL | Status: AC
  Administered 2020-06-18: 15 g via ORAL
  Filled 2020-06-18: qty 60

## 2020-06-18 MED ORDER — ALPRAZOLAM 0.5 MG PO TABS
0.5000 mg | ORAL_TABLET | Freq: Every day | ORAL | Status: DC
  Administered 2020-06-20 – 2020-06-29 (×10): 0.5 mg via ORAL
  Filled 2020-06-18 (×10): qty 1

## 2020-06-18 MED ORDER — SODIUM BICARBONATE 8.4 % IV SOLN
INTRAVENOUS | Status: AC
  Administered 2020-06-18: 50 meq via INTRAVENOUS
  Filled 2020-06-18: qty 50

## 2020-06-18 MED ORDER — INSULIN ASPART 100 UNIT/ML IV SOLN: 5.0000 [IU] | Freq: Once | INTRAVENOUS | Status: DC

## 2020-06-18 MED ORDER — HEPARIN SODIUM (PORCINE) 5000 UNIT/ML IJ SOLN
5000.0000 [IU] | Freq: Three times a day (TID) | INTRAMUSCULAR | Status: DC
  Administered 2020-06-19 – 2020-06-22 (×8): 5000 [IU] via SUBCUTANEOUS
  Filled 2020-06-18 (×7): qty 1

## 2020-06-18 MED ORDER — INSULIN ASPART 100 UNIT/ML ~~LOC~~ SOLN
4.0000 [IU] | Freq: Once | SUBCUTANEOUS | Status: AC
  Administered 2020-06-18: 4 [IU] via INTRAVENOUS

## 2020-06-18 MED ORDER — ATORVASTATIN CALCIUM 80 MG PO TABS
80.0000 mg | ORAL_TABLET | Freq: Every evening | ORAL | Status: DC
  Administered 2020-06-19 – 2020-06-29 (×12): 80 mg via ORAL
  Filled 2020-06-18 (×13): qty 1

## 2020-06-18 MED ORDER — ALPRAZOLAM 0.25 MG PO TABS
0.2500 mg | ORAL_TABLET | Freq: Two times a day (BID) | ORAL | Status: DC
  Administered 2020-06-19 – 2020-06-30 (×17): 0.25 mg via ORAL
  Filled 2020-06-18 (×21): qty 1

## 2020-06-18 MED ORDER — ACETAMINOPHEN 325 MG PO TABS
650.0000 mg | ORAL_TABLET | Freq: Four times a day (QID) | ORAL | Status: DC | PRN
  Administered 2020-06-21: 650 mg via ORAL
  Filled 2020-06-18: qty 2

## 2020-06-18 MED ORDER — CLOPIDOGREL BISULFATE 75 MG PO TABS
75.0000 mg | ORAL_TABLET | Freq: Every day | ORAL | Status: DC
  Administered 2020-06-19 – 2020-06-30 (×12): 75 mg via ORAL
  Filled 2020-06-18 (×14): qty 1

## 2020-06-18 MED ORDER — PANTOPRAZOLE SODIUM 40 MG PO TBEC
40.0000 mg | DELAYED_RELEASE_TABLET | Freq: Two times a day (BID) | ORAL | Status: DC
  Administered 2020-06-18 – 2020-06-30 (×24): 40 mg via ORAL
  Filled 2020-06-18 (×25): qty 1

## 2020-06-18 NOTE — H&P (Signed)
TRH H&P   Patient Demographics:    Donna Howe, is a 64 y.o. female  MRN: [id number]   DOB - August 10, 1956  Admit Date - 06/18/2020  Outpatient Primary MD for the patient is Practice, [facility]  Referring MD/NP/PA: Dr Reather Converse  Outpatient Specialists: renal Dr Joelyn Oms  Patient coming from: Home  Chief Complaint  Patient presents with  . Bradycardia      HPI:    Donna Howe  is a 64 y.o. female, with a history of diabetes mellitus type 2, hyperlipidemia, hypertension, CKD stage V, coronary artery diseasewith NSTEMI and DES, right carotid stenosis, peripheral arterial disease, patient with recent hospitalization at [facility] for NSTEMI, was treated with heparin GTT, started on low-dose metoprolol, she was just discharged yesterday 9/30, patient presents to ED secondary to generalized weakness, fatigue, mild confusion and slurred of speech, patient reports she felt groggy this morning which is not her baseline, she did not take any medications this morning, as any chest pain, shortness of breath, patient is with known CKD stage V, she was supposed to follow with vascular surgery regarding access to initiate hemodialysis. -In ED she was noted to be bradycardic at 45, EKG showing new right bundle branch block, no hypotension, but her labs were significant for hyperkalemia at 6.7, and worsening creatinine to 5.18, significantly elevated BNP at 2.129, her high-sensitivity troponins were elevated at 482, patient received atropine on presentation, and she was started on bicarb, calcium gluconate, she received Kayexalate, and IV insulin, ED physician discussed with renal at [facility] who recommended transfer to [facility] as they will need to initiate dialysis this admission, triage hospitalist consulted to admit.    Review of systems:    In addition to the HPI  above,  No Fever-chills, reports weakness, and some confusion No Headache, No changes with Vision or hearing, No problems swallowing food or Liquids, No Chest pain, Cough or Shortness of Breath, No Abdominal pain, No Nausea or Vommitting, Bowel movements are regular, No Blood in stool or Urine, No dysuria, No new skin rashes or bruises, No new joints pains-aches,  No new weakness, tingling, numbness in any extremity, No recent weight gain or loss, No polyuria, polydypsia or polyphagia, No significant Mental Stressors.  A full 10 point Review of Systems was done, except as stated above, all other Review of Systems were negative.   With Past History of the following :    Past Medical History:  Diagnosis Date  . Anemia   . Asthma   . CAD (coronary artery disease)    Multivessel s/p CABG 2005, numerous PCIs since that time  . Carotid artery disease (HCC)    R CEA  . Chronic kidney disease (CKD), stage III (moderate) (HCC)   . Essential hypertension   . Gout   . Hyperlipidemia   . Hypothyroidism   . Myocardial infarction ([place])   .  PAD (peripheral artery disease) ([place])    Dr. Kellie Simmering  . S/P angioplasty with stent- DES to [facility] and to LIMA to LAD with DES 04/09/18.   04/10/2018  . SBO (small bowel obstruction) ([place]) 2011   Status post lysis of adhesions & hernia repair  . Sinus bradycardia   . Thrombocytopenia, unspecified ([place])   . Type 2 diabetes mellitus ([place])   . Umbilical hernia       Past Surgical History:  Procedure Laterality Date  . CESAREAN SECTION  1984  . CHOLECYSTECTOMY  2010  . CORONARY ARTERY BYPASS GRAFT  2005  . CORONARY BALLOON ANGIOPLASTY N/A 05/31/2017   Procedure: CORONARY BALLOON ANGIOPLASTY;  Surgeon: Jettie Booze, MD;  Location: [place] [facility];  Service: Cardiovascular;  Laterality: N/A;  . CORONARY STENT INTERVENTION N/A 05/31/2017   Procedure: CORONARY STENT INTERVENTION;  Surgeon: Jettie Booze, MD;  Location: [place] [facility];   Service: Cardiovascular;  Laterality: N/A;  . CORONARY STENT INTERVENTION N/A 04/09/2018   Procedure: CORONARY STENT INTERVENTION;  Surgeon: Jettie Booze, MD;  Location: [place] [facility];  Service: Cardiovascular;  Laterality: N/A;  SVG RCA  . ENDARTERECTOMY Right 04/18/2013   Procedure: ENDARTERECTOMY CAROTID;  Surgeon: Mal Misty, MD;  Location: [place];  Service: Vascular;  Laterality: Right;  . HERNIA REPAIR  1989  . Incisional hernia repair x2  03/04/2010   Laparoscopic with 35cm mesh by Dr Ronnald Collum  . LEFT HEART CATH AND CORS/GRAFTS ANGIOGRAPHY N/A 05/31/2017   Procedure: LEFT HEART CATH AND CORS/GRAFTS ANGIOGRAPHY;  Surgeon: Jettie Booze, MD;  Location: [place] [facility];  Service: Cardiovascular;  Laterality: N/A;  . LEFT HEART CATH AND CORS/GRAFTS ANGIOGRAPHY N/A 04/08/2018   Procedure: LEFT HEART CATH AND CORS/GRAFTS ANGIOGRAPHY;  Surgeon: Jettie Booze, MD;  Location: [place] [facility];  Service: Cardiovascular;  Laterality: N/A;  . LEFT HEART CATHETERIZATION WITH CORONARY ANGIOGRAM N/A 12/19/2012   Procedure: LEFT HEART CATHETERIZATION WITH CORONARY ANGIOGRAM;  Surgeon: Josue Hector, MD;  Location: [facility] CATH LAB;  Service: Cardiovascular;  Laterality: N/A;  . LEFT HEART CATHETERIZATION WITH CORONARY/GRAFT ANGIOGRAM N/A 04/19/2013   Procedure: LEFT HEART CATHETERIZATION WITH Beatrix Fetters;  Surgeon: Lorretta Harp, MD;  Location: [facility] CATH LAB;  Service: Cardiovascular;  Laterality: N/A;  . PATCH ANGIOPLASTY Right 04/18/2013   Procedure: PATCH ANGIOPLASTY Right Internal Carotid Artery;  Surgeon: Mal Misty, MD;  Location: [place];  Service: Vascular;  Laterality: Right;  . PERCUTANEOUS CORONARY STENT INTERVENTION (PCI-S) Right 12/19/2012   Procedure: PERCUTANEOUS CORONARY STENT INTERVENTION (PCI-S);  Surgeon: Josue Hector, MD;  Location: [facility] CATH LAB;  Service: Cardiovascular;  Laterality: Right;  . SHOULDER SURGERY        Social History:       Social History   Tobacco Use  . Smoking status: Former Smoker    Packs/day: 1.00    Years: 20.00    Pack years: 20.00    Types: Cigarettes    Quit date: 12/10/2012    Years since quitting: 7.5  . Smokeless tobacco: Never Used  Substance Use Topics  . Alcohol use: No    Alcohol/week: 0.0 standard drinks       Family History :     Family History  Problem Relation Age of Onset  . Diabetes Mother   . Heart disease Mother        before age 9  . Hyperlipidemia Mother   . Hypertension Mother   .  Thyroid disease Father   . Hypertension Father   . AAA (abdominal aortic aneurysm) Father   . Heart disease Brother        before age 69  . Hypertension Brother   . Hyperlipidemia Son   . Hypertension Son       Home Medications:   Prior to Admission medications   Medication Sig Start Date End Date Taking? Authorizing Provider  Albuterol Sulfate 108 (90 Base) MCG/ACT AEPB Inhale 2 puffs into the lungs every 4 (four) hours as needed (Shortness of breath).  Patient not taking: Reported on 06/12/2020    [provider]  ALPRAZolam Duanne Moron) 0.5 MG tablet Take 0.25-0.5 mg by mouth See admin instructions. Take 0.25 mg tab by mouth every morning & evening as needed  and .05 mg tab at bedtime    [provider]  aspirin EC 81 MG tablet Take 81 mg by mouth daily.     [provider]  atorvastatin (LIPITOR) 80 MG tablet Take 1 tablet (80 mg total) by mouth every evening. 12/19/19   Johnson, Clanford L, MD  benzonatate (TESSALON) 100 MG capsule Take 100 mg by mouth 3 (three) times daily as needed for cough.    [provider]  clopidogrel (PLAVIX) 75 MG tablet Take 1 tablet (75 mg total) by mouth daily. 08/27/18   Satira Sark, MD  ferrous sulfate 325 (65 FE) MG tablet Take 325 mg by mouth daily with breakfast.    [provider]  gabapentin (NEURONTIN) 400 MG capsule Take 1 capsule (400 mg total) by mouth 2 (two) times daily. 06/17/20   Barton Dubois, MD  ipratropium (ATROVENT HFA) 17 MCG/ACT inhaler Inhale 2 puffs into the lungs See admin instructions. Inhale 2 puffs every 4 to 6 hrs as needed.    [provider]  levothyroxine (SYNTHROID, LEVOTHROID) 125 MCG tablet Take 125 mcg by mouth daily.     [provider]  metoprolol tartrate (LOPRESSOR) 25 MG tablet Take 0.5 tablets (12.5 mg total) by mouth 2 (two) times daily. 06/17/20   Barton Dubois, MD  nitroGLYCERIN (NITROSTAT) 0.4 MG SL tablet Place 0.4 mg under the tongue every 5 (five) minutes as needed for chest pain. Not to exceed 3 in 15 minute time frame    [provider]  NOVOLIN 70/30 RELION (70-30) 100 UNIT/ML injection Inject 29 Units into the skin 2 (two) times daily with a meal. Am & PM 08/28/15   [provider]  ondansetron (ZOFRAN ODT) 8 MG disintegrating tablet Take 1 tablet (8 mg total) by mouth every 8 (eight) hours as needed for nausea or vomiting. 08/14/15   Evalee Jefferson, PA-C  pantoprazole (PROTONIX) 40 MG tablet Take 1 tablet (40 mg total) by mouth 2 (two) times daily. 06/17/20   Barton Dubois, MD  ranolazine (RANEXA) 500 MG 12 hr tablet Take 1 tablet (500 mg total) by mouth 2 (two) times daily. 06/17/20   Barton Dubois, MD  sodium bicarbonate 650 MG tablet Take 650 mg by mouth 2 (two) times daily.    [provider]  Vitamin D, Ergocalciferol, (DRISDOL) 1.25 MG (50000 UNIT) CAPS capsule Take 50,000 Units by mouth once a week. 12/31/19   [provider]     Allergies:     Allergies  Allergen Reactions  . Penicillins Other (See Comments)    REACTION: Unknown, told as a child Has patient had a PCN reaction causing immediate rash, facial/tongue/throat swelling, SOB or lightheadedness with hypotension: Unknown Has  patient had a PCN reaction causing severe rash involving mucus membranes or skin necrosis: Unknown Has patient had a PCN reaction that required hospitalization: Unknown Has patient had a PCN reaction  occurring within the last 10 years: No If all of the above answers are "NO", then may proceed with Cephalosporin use.      Physical Exam:   Vitals  Blood pressure (!) 142/32, pulse (!) 46, resp. rate 18, height 5\' 6"  (1.676 m), weight 107 kg, SpO2 90 %.   1. General developed female, laying in bed, in no apparent distress  2. Normal affect and insight, Not Suicidal or Homicidal, Awake Alert, Oriented X 3.  3. No F.N deficits, ALL C.Nerves Intact, Strength 5/5 all 4 extremities, Sensation intact all 4 extremities, Plantars down going.  4. Ears and Eyes appear Normal, Conjunctivae clear, PERRLA. Moist Oral Mucosa.  5. Supple Neck, No JVD, No cervical lymphadenopathy appriciated, No Carotid Bruits.  6. Symmetrical Chest wall movement, Good air movement bilaterally, CTAB.  7.  Bradycardic, No Gallops, Rubs or Murmurs, No Parasternal Heave.  Lower  extremity edema right +2, left +1  8. Positive Bowel Sounds, Abdomen Soft, No tenderness, No organomegaly appriciated,No rebound -guarding or rigidity.  9.  No Cyanosis, Normal Skin Turgor, No Skin Rash or Bruise.  10. Good muscle tone,  joints appear normal , no effusions, Normal ROM.  11. No Palpable Lymph Nodes in Neck or Axillae     Data Review:    CBC Recent Labs  Lab 06/14/20 0841 06/15/20 0544 06/16/20 0604 06/17/20 0551 06/18/20 1543  WBC 9.9 9.0 10.0 9.7 11.5*  HGB 9.8* 8.4* 8.9* 8.7* 9.2*  HCT 30.9* 26.7* 28.8* 26.6* 27.5*  PLT 144* 120* 123* 101* 88*  MCV 96.0 93.7 95.0 92.4 91.7  MCH 30.4 29.5 29.4 30.2 30.7  MCHC 31.7 31.5 30.9 32.7 33.5  RDW 14.8 14.6 15.2 15.0 14.9  LYMPHSABS 1.2  --   --   --  0.8  MONOABS 0.9  --   --   --  0.7  EOSABS 0.0  --   --   --  0.0  BASOSABS 0.0  --   --   --  0.0   ------------------------------------------------------------------------------------------------------------------  Chemistries  Recent Labs  Lab 06/14/20 0841 06/15/20 0544 06/16/20 0604 06/17/20 0551  06/18/20 1543  NA 137 134*  134* 136 137 132*  K 4.5 5.3*  5.3* 5.4* 5.3* 6.7*  CL 105 105  104 106 106 101  CO2 22 21*  21* 23 24 20*  GLUCOSE 181* 254*  254* 125* 118* 306*  BUN 81* 90*  89* 92* 91* 106*  CREATININE 4.49* 4.60*  4.64* 4.50* 4.43* 5.18*  CALCIUM 8.1* 8.1*  8.1* 8.3* 8.4* 8.6*  MG  --  2.1  --   --  2.6*   ------------------------------------------------------------------------------------------------------------------ estimated creatinine clearance is 14.1 mL/min (A) (by C-G formula based on SCr of 5.18 mg/dL (H)). ------------------------------------------------------------------------------------------------------------------ Recent Labs    06/18/20 1543  TSH 1.057    Coagulation profile No results for input(s): INR, PROTIME in the last 168 hours. ------------------------------------------------------------------------------------------------------------------- No results for input(s): DDIMER in the last 72 hours. -------------------------------------------------------------------------------------------------------------------  Cardiac Enzymes No results for input(s): CKMB, TROPONINI, MYOGLOBIN in the last 168 hours.  Invalid input(s): CK ------------------------------------------------------------------------------------------------------------------    Component Value Date/Time   BNP 2,129.0 (H) 06/18/2020 1549     ---------------------------------------------------------------------------------------------------------------  Urinalysis    Component Value Date/Time   COLORURINE YELLOW 06/18/2020 1536   APPEARANCEUR CLOUDY (A) 06/18/2020  1536   LABSPEC 1.015 06/18/2020 1536   PHURINE 5.0 06/18/2020 1536   GLUCOSEU 150 (A) 06/18/2020 1536   HGBUR NEGATIVE 06/18/2020 [place] 06/18/2020 1536   KETONESUR NEGATIVE 06/18/2020 1536   PROTEINUR >=300 (A) 06/18/2020 1536   UROBILINOGEN 0.2 10/26/2013 2229   NITRITE NEGATIVE  06/18/2020 [place] 06/18/2020 1536    ----------------------------------------------------------------------------------------------------------------   Imaging Results:    DG Chest 1 View  Result Date: 06/18/2020 CLINICAL DATA:  Shortness of breath. Pt reports history of same during most recent admission. Hx diabetes, MI, HTN, CAD, asthma EXAM: CHEST  1 VIEW COMPARISON:  Chest x-ray 06/12/2020. FINDINGS: Stable cardiac enlargement that appears more prominent due to AP portable technique. Diffuse increase interstitial and airspace opacities. Possible bilateral pleural effusions. No pneumothorax. No acute osseous abnormality. IMPRESSION: Pulmonary findings suggestive of pulmonary edema versus atypical/viral pneumonia. Suggestion of bilateral pleural effusions. Consider lateral view for further evaluation. Electronically Signed   By: Iven Finn M.D.   On: 06/18/2020 16:46   CT Head Wo Contrast  Result Date: 06/18/2020 CLINICAL DATA:  Mental status change, unknown cause general weakness, groggy EXAM: CT HEAD WITHOUT CONTRAST TECHNIQUE: Contiguous axial images were obtained from the base of the skull through the vertex without intravenous contrast. COMPARISON:  CT head 10/01/2018. FINDINGS: Brain: No evidence of acute infarction, hemorrhage, hydrocephalus, extra-axial collection or mass lesion/mass effect. Vascular: No hyperdense vessel or unexpected calcification. Skull: Negative for fracture or focal lesion. Sinuses/Orbits: No acute finding. Other: None. IMPRESSION: No acute intracranial abnormality. Electronically Signed   By: Iven Finn M.D.   On: 06/18/2020 16:37    My personal review of EKG: Rhythm sinus bradycardia with new right bundle branch block 45 bpm, QTc of 472, with T wave abnormalities .   Assessment & Plan:    Active Problems:   Bradycardia   Diabetes mellitus with renal manifestation (HCC)   Atherosclerosis of native artery of extremity with  intermittent claudication (HCC)   CAD (coronary artery disease)   Carotid artery disease (HCC)   Anemia   Acute renal failure superimposed on stage 5 chronic kidney disease, not on chronic dialysis (HCC)   Chronic diastolic HF (heart failure) (HCC)   Hyperkalemia   Bradycardia -this is most likely in the setting of hyperkalemia, as well possibly related to recent initiation of beta-blockers in the setting of ACS. -She did receive atropine initially in ED, and appears to be improving with correction of her hyperkalemia, please see discussion below. -Would consider glucagon to reverse beta-blockers effect for since. -Continue to monitor on telemetry.  Hyperkalemia -Other any medication causing hyperkalemia, this is most likely due to worsening renal function. -ED physician discussed with Dr. Royce Macadamia from renal, patient likely will need to start hemodialysis this admission. -She received calcium gluconate, bicarb and Kayexalate, with 80 mg of IV Lasix, I will give D50 with IV insulin, repeat level, continue to monitor on telemetry, will give extra 15 g of Kayexalate as well.  AKI on CKD stage V -At this point likely she will need dialysis, she will be admitted to [facility].  Lower extremity edema right> left -Is chronic, most likely due to volume overload from worsening renal failure, but will obtain venous Doppler.  history of CAD with recent NSTEMI - -Echocardiogram--EF 55-60%, no WMA, grade 2 DD -04/09/2018 heart catheterization--ost LAD-prox LAD 80%, ost 1st margina 80%, mid RCA 90%, LIMA-LAD 70%; DES x 2 placed -Continue aspirin and Plavix -Continue statin -Toprol  has been stopped due to bradycardia -She denies any chest pain, initial at bedtime troponin came back elevated at 481, will trend.  Anemia of CKD -No indication for transfusion.  Peripheral arterial disease -Continue Pletal  Hypothyroidism -Continue Synthroid  Class II obesity -Body mass index is 38.22  kg/m.  Hyperlipidemia -Continue statin  Diabetes mellitus -Hold insulin 70/30 during hospital stay, will start on Lantus 10 units, and insulin sliding scale.     DVT Prophylaxis Heparin   AM Labs Ordered, also please review Full Orders  Family Communication: Admission, patients condition and plan of care including tests being ordered have been discussed with the patient who indicate understanding and agree with the plan and Code Status.  Code Status full code  Likely DC to home  Condition GUARDED  Consults called: Renal by ED  Admission status: Inpatient  Time spent in minutes : 60 minutes   Phillips Climes M.D on 06/18/2020 at 5:42 PM   [facility] - Office  [phone]

## 2020-06-18 NOTE — ED Provider Notes (Signed)
[facility] EMERGENCY DEPARTMENT Provider Note   CSN: [id number] Arrival date & time: 06/18/20  1450     History Chief Complaint  Patient presents with  . Bradycardia    Donna Howe is a 63 y.o. female.  Patient with complex medical history including anemia, CAD, CABG 2005, chronic kidney disease, high blood pressure, recent admission and discharge yesterday for NSTEMI and symptoms related presents with general weakness, fatigue and general slurring of the speech.  Patient felt groggy when she woke up approximately 1:00 today and her family member on the phone felt that she was not at her baseline talking.  Patient denies taking any medications this morning.  Patient says she has not filled or changed any ones from discharge.  Patient is on a beta-blocker.  No fevers or chills.  Intermittent chest discomfort similar to when admitted.        Past Medical History:  Diagnosis Date  . Anemia   . Asthma   . CAD (coronary artery disease)    Multivessel s/p CABG 2005, numerous PCIs since that time  . Carotid artery disease (HCC)    R CEA  . Chronic kidney disease (CKD), stage III (moderate) (HCC)   . Essential hypertension   . Gout   . Hyperlipidemia   . Hypothyroidism   . Myocardial infarction ([place])   . PAD (peripheral artery disease) ([place])    Dr. Kellie Simmering  . S/P angioplasty with stent- DES to [facility] and to [facility] to LAD with DES 04/09/18.   04/10/2018  . SBO (small bowel obstruction) ([place]) 2011   Status post lysis of adhesions & hernia repair  . Sinus bradycardia   . Thrombocytopenia, unspecified ([place])   . Type 2 diabetes mellitus ([place])   . Umbilical hernia     Patient Active Problem List   Diagnosis Date Noted  . Chronic diastolic HF (heart failure) ([place])   . Gastroesophageal reflux disease   . Acute renal failure superimposed on stage 5 chronic kidney disease, not on chronic dialysis ([place]) 06/13/2020  . Uncontrolled type 2 diabetes mellitus with hyperglycemia, with  long-term current use of insulin ([place]) 06/13/2020  . Pneumonia 12/17/2019  . Cough variant asthma with component of UACS 11/20/2019  . Elevated troponin 10/02/2019  . Chest pain 10/01/2019  . Unstable angina ([place]) 04/18/2018  . Hypothyroidism 04/18/2018  . S/P angioplasty with stent- DES to [facility] and to [facility] to LAD with DES 04/09/18.   04/10/2018  . Angina pectoris ([place]) 04/05/2018  . Status post coronary artery stent placement   . ACS (acute coronary syndrome) ([place]) 05/31/2017  . Acute chest pain   . NSTEMI (non-ST elevated myocardial infarction) ([place])   . History of coronary artery disease   . Diabetes ([place]) 10/28/2013  . Hypoglycemia associated with diabetes ([place]) 10/28/2013  . Thrombocytopenia, unspecified ([place]) 10/28/2013  . Hypokalemia 10/27/2013  . Syncope 10/26/2013  . Anemia 10/26/2013  . AKI (acute kidney injury) ([place]) 10/26/2013  . Fracture of toe of right foot 10/26/2013  . Umbilical hernia   . Carotid artery disease ([place])   . Occlusion and stenosis of carotid artery without mention of cerebral infarction 04/15/2013  . Hx of CABG   . Ejection fraction   . Atherosclerosis of native artery of extremity with intermittent claudication ([place]) 02/11/2013  . Diabetes mellitus with renal manifestation ([place]) 01/21/2013  . Bradycardia 01/03/2013  . Chronic kidney disease (CKD), stage III (moderate) (HCC)   . Gout   . PROTEINURIA, MILD 01/18/2010  .  Class 2 obesity 08/26/2009  . Asthma 08/26/2009  . Hyperlipidemia 03/31/2007  . Essential hypertension 03/31/2007  . CAD (coronary artery disease) 03/31/2007    Past Surgical History:  Procedure Laterality Date  . CESAREAN SECTION  1984  . CHOLECYSTECTOMY  2010  . CORONARY ARTERY BYPASS GRAFT  2005  . CORONARY BALLOON ANGIOPLASTY N/A 05/31/2017   Procedure: CORONARY BALLOON ANGIOPLASTY;  Surgeon: Jettie Booze, MD;  Location: [place] CV LAB;  Service: Cardiovascular;  Laterality: N/A;  . CORONARY STENT INTERVENTION  N/A 05/31/2017   Procedure: CORONARY STENT INTERVENTION;  Surgeon: Jettie Booze, MD;  Location: [place] [facility];  Service: Cardiovascular;  Laterality: N/A;  . CORONARY STENT INTERVENTION N/A 04/09/2018   Procedure: CORONARY STENT INTERVENTION;  Surgeon: Jettie Booze, MD;  Location: [place] [facility];  Service: Cardiovascular;  Laterality: N/A;  SVG RCA  . ENDARTERECTOMY Right 04/18/2013   Procedure: ENDARTERECTOMY CAROTID;  Surgeon: Mal Misty, MD;  Location: [place];  Service: Vascular;  Laterality: Right;  . HERNIA REPAIR  1989  . Incisional hernia repair x2  03/04/2010   Laparoscopic with 35cm mesh by Dr Ronnald Collum  . LEFT HEART CATH AND CORS/GRAFTS ANGIOGRAPHY N/A 05/31/2017   Procedure: LEFT HEART CATH AND CORS/GRAFTS ANGIOGRAPHY;  Surgeon: Jettie Booze, MD;  Location: [place] [facility];  Service: Cardiovascular;  Laterality: N/A;  . LEFT HEART CATH AND CORS/GRAFTS ANGIOGRAPHY N/A 04/08/2018   Procedure: LEFT HEART CATH AND CORS/GRAFTS ANGIOGRAPHY;  Surgeon: Jettie Booze, MD;  Location: [place] [facility];  Service: Cardiovascular;  Laterality: N/A;  . LEFT HEART CATHETERIZATION WITH CORONARY ANGIOGRAM N/A 12/19/2012   Procedure: LEFT HEART CATHETERIZATION WITH CORONARY ANGIOGRAM;  Surgeon: Josue Hector, MD;  Location: [facility] CATH LAB;  Service: Cardiovascular;  Laterality: N/A;  . LEFT HEART CATHETERIZATION WITH CORONARY/GRAFT ANGIOGRAM N/A 04/19/2013   Procedure: LEFT HEART CATHETERIZATION WITH Beatrix Fetters;  Surgeon: Lorretta Harp, MD;  Location: [facility] CATH LAB;  Service: Cardiovascular;  Laterality: N/A;  . PATCH ANGIOPLASTY Right 04/18/2013   Procedure: PATCH ANGIOPLASTY Right Internal Carotid Artery;  Surgeon: Mal Misty, MD;  Location: [place];  Service: Vascular;  Laterality: Right;  . PERCUTANEOUS CORONARY STENT INTERVENTION (PCI-S) Right 12/19/2012   Procedure: PERCUTANEOUS CORONARY STENT INTERVENTION (PCI-S);  Surgeon: Josue Hector, MD;   Location: [facility] C[facility] LAB;  Service: Cardiovascular;  Laterality: Right;  . SHOULDER SURGERY       OB History   No obstetric history on file.     Family History  Problem Relation Age of Onset  . Diabetes Mother   . Heart disease Mother        before age 19  . Hyperlipidemia Mother   . Hypertension Mother   . Thyroid disease Father   . Hypertension Father   . AAA (abdominal aortic aneurysm) Father   . Heart disease Brother        before age 33  . Hypertension Brother   . Hyperlipidemia Son   . Hypertension Son     Social History   Tobacco Use  . Smoking status: Former Smoker    Packs/day: 1.00    Years: 20.00    Pack years: 20.00    Types: Cigarettes    Quit date: 12/10/2012    Years since quitting: 7.5  . Smokeless tobacco: Never Used  Vaping Use  . Vaping Use: Never used  Substance Use Topics  . Alcohol use: No    Alcohol/week: 0.0  standard drinks  . Drug use: No    Home Medications Prior to Admission medications   Medication Sig Start Date End Date Taking? Authorizing Provider  Albuterol Sulfate 108 (90 Base) MCG/ACT AEPB Inhale 2 puffs into the lungs every 4 (four) hours as needed (Shortness of breath).  Patient not taking: Reported on 06/12/2020    [provider]  ALPRAZolam Duanne Moron) 0.5 MG tablet Take 0.25-0.5 mg by mouth See admin instructions. Take 0.25 mg tab by mouth every morning & evening as needed  and .05 mg tab at bedtime    [provider]  aspirin EC 81 MG tablet Take 81 mg by mouth daily.     [provider]  atorvastatin (LIPITOR) 80 MG tablet Take 1 tablet (80 mg total) by mouth every evening. 12/19/19   Johnson, Clanford L, MD  benzonatate (TESSALON) 100 MG capsule Take 100 mg by mouth 3 (three) times daily as needed for cough.    [provider]  clopidogrel (PLAVIX) 75 MG tablet Take 1 tablet (75 mg total) by mouth daily. 08/27/18   Satira Sark, MD  ferrous sulfate 325 (65 FE) MG tablet Take 325 mg by  mouth daily with breakfast.    [provider]  gabapentin (NEURONTIN) 400 MG capsule Take 1 capsule (400 mg total) by mouth 2 (two) times daily. 06/17/20   Barton Dubois, MD  ipratropium (ATROVENT HFA) 17 MCG/ACT inhaler Inhale 2 puffs into the lungs See admin instructions. Inhale 2 puffs every 4 to 6 hrs as needed.    [provider]  levothyroxine (SYNTHROID, LEVOTHROID) 125 MCG tablet Take 125 mcg by mouth daily.     [provider]  metoprolol tartrate (LOPRESSOR) 25 MG tablet Take 0.5 tablets (12.5 mg total) by mouth 2 (two) times daily. 06/17/20   Barton Dubois, MD  nitroGLYCERIN (NITROSTAT) 0.4 MG SL tablet Place 0.4 mg under the tongue every 5 (five) minutes as needed for chest pain. Not to exceed 3 in 15 minute time frame    [provider]  NOVOLIN 70/30 RELION (70-30) 100 UNIT/ML injection Inject 29 Units into the skin 2 (two) times daily with a meal. Am & PM 08/28/15   [provider]  ondansetron (ZOFRAN ODT) 8 MG disintegrating tablet Take 1 tablet (8 mg total) by mouth every 8 (eight) hours as needed for nausea or vomiting. 08/14/15   Evalee Jefferson, PA-C  pantoprazole (PROTONIX) 40 MG tablet Take 1 tablet (40 mg total) by mouth 2 (two) times daily. 06/17/20   Barton Dubois, MD  ranolazine (RANEXA) 500 MG 12 hr tablet Take 1 tablet (500 mg total) by mouth 2 (two) times daily. 06/17/20   Barton Dubois, MD  sodium bicarbonate 650 MG tablet Take 650 mg by mouth 2 (two) times daily.    [provider]  Vitamin D, Ergocalciferol, (DRISDOL) 1.25 MG (50000 UNIT) CAPS capsule Take 50,000 Units by mouth once a week. 12/31/19   [provider]    Allergies    Penicillins  Review of Systems   Review of Systems  Constitutional: Positive for fatigue. Negative for chills and fever.  HENT: Negative for congestion.   Eyes: Negative for visual disturbance.  Respiratory: Positive for shortness of breath. Negative for cough.     Cardiovascular: Positive for chest pain.  Gastrointestinal: Negative for abdominal pain and vomiting.  Genitourinary: Negative for dysuria and flank pain.  Musculoskeletal: Negative for back pain, neck pain and neck stiffness.  Skin: Negative for rash.  Neurological: Positive for weakness. Negative for light-headedness and headaches.    Physical Exam Updated Vital Signs BP (!) 142/32   Pulse (!) 46   Resp 18   Ht 5\' 6"  (1.676 m)   Wt 107 kg   SpO2 90% Comment: 84-86% on room air.  BMI 38.07 kg/m   Physical Exam Vitals and nursing note reviewed.  Constitutional:      Appearance: She is well-developed.  HENT:     Head: Normocephalic and atraumatic.  Eyes:     General:        Right eye: No discharge.        Left eye: No discharge.     Conjunctiva/sclera: Conjunctivae normal.  Neck:     Trachea: No tracheal deviation.  Cardiovascular:     Rate and Rhythm: Regular rhythm. Bradycardia present.  Pulmonary:     Effort: Pulmonary effort is normal.     Breath sounds: Normal breath sounds.  Abdominal:     General: There is no distension.     Palpations: Abdomen is soft.     Tenderness: There is no abdominal tenderness. There is no guarding.  Musculoskeletal:        General: Swelling (1+ bilaterral pitting) present.     Cervical back: Normal range of motion and neck supple.  Skin:    General: Skin is warm.     Findings: No rash.  Neurological:     General: No focal deficit present.     Mental Status: She is alert and oriented to person, place, and time.     Comments: Patient fatigue appearance, no confusion on exam, identifies objects, name, location and day.  Patient moves all extremities with mild weakness equal bilateral.  Sensation intact upper and lower extremities bilateral.  No facial droop, finger-nose intact, extraocular muscle function intact and pupils equal.     ED Results / Procedures / Treatments   Labs (all labs ordered are listed, but only abnormal  results are displayed) Labs Reviewed  BASIC METABOLIC PANEL - Abnormal; Notable for the following components:      Result Value   Sodium 132 (*)    Potassium 6.7 (*)    CO2 20 (*)    Glucose, Bld 306 (*)    BUN 106 (*)    Creatinine, Ser 5.18 (*)    Calcium 8.6 (*)    GFR calc non Af Amer 8 (*)    GFR calc Af Amer 10 (*)    All other components within normal limits  CBC WITH DIFFERENTIAL/PLATELET - Abnormal; Notable for the following components:   WBC 11.5 (*)    RBC 3.00 (*)    Hemoglobin 9.2 (*)    HCT 27.5 (*)    Platelets 88 (*)    Neutro Abs 9.7 (*)    Abs Immature Granulocytes 0.30 (*)    All other components within normal limits  MAGNESIUM - Abnormal; Notable for the following components:   Magnesium 2.6 (*)    All other components within normal limits  BRAIN NATRIURETIC PEPTIDE - Abnormal; Notable for the following components:   B Natriuretic Peptide 2,129.0 (*)    All other components within normal limits  URINALYSIS, ROUTINE W REFLEX MICROSCOPIC - Abnormal; Notable for the following components:   APPearance CLOUDY (*)    Glucose, UA 150 (*)    Protein, ur >=300 (*)    Bacteria, UA RARE (*)    All other components within normal limits  RESPIRATORY PANEL BY RT PCR (  FLU A&B, COVID)  TSH  TROPONIN I (HIGH SENSITIVITY)    EKG EKG Interpretation  Date/Time:  Friday June 18 2020 15:23:36 EDT Ventricular Rate:  45 PR Interval:    QRS Duration: 164 QT Interval:  546 QTC Calculation: 472 R Axis:   139 Text Interpretation: Wide QRS rhythm Right bundle branch block T wave abnormality, consider inferolateral ischemia Abnormal ECG Confirmed by Elnora Morrison [id number]) on 06/18/2020 3:36:33 PM   Radiology No results found.  Procedures .Critical Care Performed by: Elnora Morrison, MD Authorized by: Elnora Morrison, MD   Critical care provider statement:    Critical care time (minutes):  40   Critical care start time:  06/18/2020 4:00 PM   Critical care end time:   06/18/2020 4:40 PM   Critical care time was exclusive of:  Separately billable procedures and treating other patients and teaching time   Critical care was necessary to treat or prevent imminent or life-threatening deterioration of the following conditions:  Metabolic crisis   Critical care was time spent personally by me on the following activities:  Discussions with consultants, evaluation of patient's response to treatment, examination of patient, ordering and performing treatments and interventions, ordering and review of laboratory studies, ordering and review of radiographic studies, pulse oximetry, re-evaluation of patient's condition and review of old charts   (including critical care time)  Medications Ordered in ED Medications  atropine 1 MG/10ML injection 0.5 mg (has no administration in time range)  calcium gluconate inj 10% (1 g) URGENT USE ONLY! (has no administration in time range)  albuterol (PROVENTIL) (2.5 MG/3ML) 0.083% nebulizer solution 10 mg (has no administration in time range)  sodium bicarbonate injection 50 mEq (has no administration in time range)  insulin aspart (novoLOG) injection 5 Units (has no administration in time range)  furosemide (LASIX) injection 40 mg (has no administration in time range)    ED Course  I have reviewed the triage vital signs and the nursing notes.  Pertinent labs & imaging results that were available during my care of the patient were reviewed by me and considered in my medical decision making (see chart for details).    MDM Rules/Calculators/A&P                          Patient presents with general fatigue, weakness and similar intermittent chest discomfort from recent hospitalization for which she was discharged yesterday.  Reviewed medical records and discharge summary which showed her acute on chronic renal failure, chronic anemia, NSTEMI, work-up for blood clots was negative.  Reviewed medication list and patient is on  beta-blocker however said she did not take today.  Patient is differential is broad with general fatigue and general weakness and bradycardia including thyroid related, metabolic/electrolyte, infectious to possibly explain general weakness so checking urine and chest x-ray.  No focal neuro deficits, screening CT head to ensure no signs of bleeding or swelling.  Remainder of work-up pending right now.  EKG showed sinus bradycardia with widened QRS, will check potassium. Atropine ordered.   Patient's blood work returned potassium 6.7, creatinine 5.1 elevated fours, sodium 132.  Hemoglobin 9.2, magnesium 2.6. Urinalysis cloudy with glucose and protein secondary from diabetes and renal failure.  Patient has acute EKG changes with hyperkalemia, IV calcium gluconate, insulin, bicarb ordered immediately.  Discussed monitor with nursing staff. Paged nephrology and plan to admit to hospitalist.     Final Clinical Impression(s) / ED Diagnoses Final diagnoses:  General weakness  Symptomatic bradycardia  Acute renal failure, unspecified acute renal failure type ([place])  Hyperkalemia    Rx / DC Orders ED Discharge Orders    None       Elnora Morrison, MD 06/19/20 2323

## 2020-06-18 NOTE — ED Notes (Signed)
Date and time results received: 06/18/20 1845   Test: K+ Critical Value: 6.7  Name of Provider Notified: Elgergawy, MD

## 2020-06-18 NOTE — ED Triage Notes (Signed)
Per EMS, pt woke up this afternoon approximately 1300. EMS reports pt noted to have bradycardia and intermittent lip numbness. Pt alert and oriented. cbg en route 322. Pt discharged from [facility] 06/17/20. Pt reports history of same during most recent admission.

## 2020-06-18 NOTE — ED Notes (Signed)
Patient transported to CT 

## 2020-06-19 ENCOUNTER — Inpatient Hospital Stay (HOSPITAL_COMMUNITY): Payer: Medicare HMO

## 2020-06-19 DIAGNOSIS — E1122 Type 2 diabetes mellitus with diabetic chronic kidney disease: Secondary | ICD-10-CM

## 2020-06-19 DIAGNOSIS — I5032 Chronic diastolic (congestive) heart failure: Secondary | ICD-10-CM

## 2020-06-19 DIAGNOSIS — I340 Nonrheumatic mitral (valve) insufficiency: Secondary | ICD-10-CM

## 2020-06-19 DIAGNOSIS — R079 Chest pain, unspecified: Secondary | ICD-10-CM

## 2020-06-19 DIAGNOSIS — E669 Obesity, unspecified: Secondary | ICD-10-CM | POA: Diagnosis not present

## 2020-06-19 DIAGNOSIS — N184 Chronic kidney disease, stage 4 (severe): Secondary | ICD-10-CM

## 2020-06-19 DIAGNOSIS — Z794 Long term (current) use of insulin: Secondary | ICD-10-CM

## 2020-06-19 DIAGNOSIS — N178 Other acute kidney failure: Secondary | ICD-10-CM | POA: Diagnosis not present

## 2020-06-19 LAB — GLUCOSE, CAPILLARY
Glucose-Capillary: 257 mg/dL — ABNORMAL HIGH (ref 70–99)
Glucose-Capillary: 182 mg/dL — ABNORMAL HIGH (ref 70–99)
Glucose-Capillary: 197 mg/dL — ABNORMAL HIGH (ref 70–99)
Glucose-Capillary: 148 mg/dL — ABNORMAL HIGH (ref 70–99)

## 2020-06-19 LAB — BASIC METABOLIC PANEL
Anion gap: 11 (ref 5–15)
BUN: 109 mg/dL — ABNORMAL HIGH (ref 8–23)
CO2: 21 mmol/L — ABNORMAL LOW (ref 22–32)
Calcium: 8.8 mg/dL — ABNORMAL LOW (ref 8.9–10.3)
Chloride: 103 mmol/L (ref 98–111)
Creatinine, Ser: 5.57 mg/dL — ABNORMAL HIGH (ref 0.44–1.00)
GFR calc Af Amer: 9 mL/min — ABNORMAL LOW (ref >60)
GFR calc non Af Amer: 8 mL/min — ABNORMAL LOW (ref >60)
Glucose, Bld: 290 mg/dL — ABNORMAL HIGH (ref 70–99)
Potassium: 6.5 mmol/L (ref 3.5–5.1)
Sodium: 135 mmol/L (ref 135–145)

## 2020-06-19 LAB — CBC
HCT: 24 % — ABNORMAL LOW (ref 36.0–46.0)
Hemoglobin: 7.7 g/dL — ABNORMAL LOW (ref 12.0–15.0)
MCH: 29.2 pg (ref 26.0–34.0)
MCHC: 32.1 g/dL (ref 30.0–36.0)
MCV: 90.9 fL (ref 80.0–100.0)
Platelets: 90 10*3/uL — ABNORMAL LOW (ref 150–400)
RBC: 2.64 MIL/uL — ABNORMAL LOW (ref 3.87–5.11)
RDW: 15.1 % (ref 11.5–15.5)
WBC: 13.3 10*3/uL — ABNORMAL HIGH (ref 4.0–10.5)
nRBC: 0.2 % (ref 0.0–0.2)

## 2020-06-19 LAB — COMPREHENSIVE METABOLIC PANEL
ALT: 211 U/L — ABNORMAL HIGH (ref 0–44)
AST: 129 U/L — ABNORMAL HIGH (ref 15–41)
Albumin: 2.8 g/dL — ABNORMAL LOW (ref 3.5–5.0)
Alkaline Phosphatase: 104 U/L (ref 38–126)
Anion gap: 14 (ref 5–15)
BUN: 110 mg/dL — ABNORMAL HIGH (ref 8–23)
CO2: 21 mmol/L — ABNORMAL LOW (ref 22–32)
Calcium: 8.8 mg/dL — ABNORMAL LOW (ref 8.9–10.3)
Chloride: 100 mmol/L (ref 98–111)
Creatinine, Ser: 5.68 mg/dL — ABNORMAL HIGH (ref 0.44–1.00)
GFR calc Af Amer: 9 mL/min — ABNORMAL LOW (ref >60)
GFR calc non Af Amer: 7 mL/min — ABNORMAL LOW (ref >60)
Glucose, Bld: 220 mg/dL — ABNORMAL HIGH (ref 70–99)
Potassium: 5.7 mmol/L — ABNORMAL HIGH (ref 3.5–5.1)
Sodium: 135 mmol/L (ref 135–145)
Total Bilirubin: 1.2 mg/dL (ref 0.3–1.2)
Total Protein: 5.5 g/dL — ABNORMAL LOW (ref 6.5–8.1)

## 2020-06-19 LAB — ECHOCARDIOGRAM LIMITED
Height: 65.984 in
S' Lateral: 3.4 cm
Weight: 3742.53 oz

## 2020-06-19 LAB — POTASSIUM: Potassium: 4.7 mmol/L (ref 3.5–5.1)

## 2020-06-19 LAB — TROPONIN I (HIGH SENSITIVITY)
Troponin I (High Sensitivity): 955 ng/L (ref <18)
Troponin I (High Sensitivity): 978 ng/L (ref <18)

## 2020-06-19 MED ORDER — ONDANSETRON HCL 4 MG/2ML IJ SOLN
4.0000 mg | Freq: Once | INTRAMUSCULAR | Status: AC
  Administered 2020-06-19: 4 mg via INTRAVENOUS
  Filled 2020-06-19: qty 2

## 2020-06-19 MED ORDER — NITROGLYCERIN IN D5W 200-5 MCG/ML-% IV SOLN
0.0000 ug/min | INTRAVENOUS | Status: DC
  Administered 2020-06-19: 5 ug/min via INTRAVENOUS
  Filled 2020-06-19: qty 250

## 2020-06-19 MED ORDER — ONDANSETRON HCL 4 MG/2ML IJ SOLN
4.0000 mg | Freq: Four times a day (QID) | INTRAMUSCULAR | Status: DC | PRN
  Administered 2020-06-21: 4 mg via INTRAVENOUS
  Filled 2020-06-19: qty 2

## 2020-06-19 MED ORDER — MORPHINE SULFATE (PF) 2 MG/ML IV SOLN
2.0000 mg | INTRAVENOUS | Status: DC | PRN
  Administered 2020-06-19 – 2020-06-21 (×3): 2 mg via INTRAVENOUS
  Filled 2020-06-19 (×3): qty 1

## 2020-06-19 MED ORDER — METOLAZONE 5 MG PO TABS: 2.5000 mg | ORAL_TABLET | Freq: Once | ORAL | Status: DC

## 2020-06-19 MED ORDER — CHLORHEXIDINE GLUCONATE CLOTH 2 % EX PADS
6.0000 | MEDICATED_PAD | Freq: Every day | CUTANEOUS | Status: DC
  Administered 2020-06-19: 6 via TOPICAL

## 2020-06-19 MED ORDER — METOLAZONE 5 MG PO TABS
5.0000 mg | ORAL_TABLET | Freq: Once | ORAL | Status: AC
  Administered 2020-06-19: 5 mg via ORAL
  Filled 2020-06-19: qty 1

## 2020-06-19 MED ORDER — PERFLUTREN LIPID MICROSPHERE
1.0000 mL | INTRAVENOUS | Status: AC | PRN
  Administered 2020-06-19: 3 mL via INTRAVENOUS
  Filled 2020-06-19: qty 10

## 2020-06-19 MED ORDER — FUROSEMIDE 10 MG/ML IJ SOLN
80.0000 mg | Freq: Once | INTRAMUSCULAR | Status: AC
  Administered 2020-06-19: 80 mg via INTRAVENOUS
  Filled 2020-06-19: qty 8

## 2020-06-19 MED ORDER — SODIUM BICARBONATE 8.4 % IV SOLN
50.0000 meq | Freq: Once | INTRAVENOUS | Status: AC
  Administered 2020-06-19: 50 meq via INTRAVENOUS

## 2020-06-19 NOTE — Consult Note (Signed)
Cardiology Consultation:   Patient ID: VERONCIA JEZEK MRN: [id number]; DOB: 1955/11/09  Admit date: 06/18/2020 Date of Consult: 06/19/2020  Primary Care Provider: [facility], [facility] Cardiologist: Rozann Lesches, MD  [place] Electrophysiologist:  None    Patient Profile:   Donna Howe is a 64 y.o. female with a hx of CAD and CKD V who is being seen today for the evaluation of chest pain at the request of Dr Wynelle Cleveland.  History of Present Illness:   Donna Howe 64 yo female history of CAD, CABG in 2005. stent SVG to RCA and [place] to the proximal circumflex 05/2017, PTCA to the proximal circumflex stage DES to the SVG to RCA and DES LIMA to the LAD 03/2018. History of DM2, HL, HTN, CKD V admitted with genralized fatigue, weakness.  In ER found to be bradycardic to 45, K 6.7, Cr up to 5.18, BNP 2129. Admitted with plans to start HD. During admit episodes of chest pains, cardiology consulted.   Admitted just last week with NSTEMI to  [facility]. Given advanced kidney dysfunction was managed medically, trops came down and chest pains resolved. Received 48hrs of hep gtt, initially nitro gtt that was able to be weaned. Antianginal therapy limited by soft bp's, was started on ranexa and discharged home.   Overnight and this AM episdoes of chest pains, cardiology consulted   K 6.7 Cr 5.18 BUN 106 WBC 11.5 Hgb 9.2 Plt 88 TSH 1.057 Mg 2.6 BNP 2129 hstrop 482-->955 COVID neg 05/2020 echo LVEF 55-60%, no WMAs, grade II DDx, normal RV function EKG SR, chronic ST/T changes  Past Medical History:  Diagnosis Date  . Anemia   . Asthma   . CAD (coronary artery disease)    Multivessel s/p CABG 2005, numerous PCIs since that time  . Carotid artery disease (HCC)    R CEA  . Chronic kidney disease (CKD), stage III (moderate) (HCC)   . Essential hypertension   . Gout   . Hyperlipidemia   . Hypothyroidism   . Myocardial infarction ([place])   . PAD  (peripheral artery disease) ([place])    Dr. Kellie Simmering  . S/P angioplasty with stent- DES to [facility] and to LIMA to LAD with DES 04/09/18.   04/10/2018  . SBO (small bowel obstruction) ([place]) 2011   Status post lysis of adhesions & hernia repair  . Sinus bradycardia   . Thrombocytopenia, unspecified ([place])   . Type 2 diabetes mellitus ([place])   . Umbilical hernia     Past Surgical History:  Procedure Laterality Date  . CESAREAN SECTION  1984  . CHOLECYSTECTOMY  2010  . CORONARY ARTERY BYPASS GRAFT  2005  . CORONARY BALLOON ANGIOPLASTY N/A 05/31/2017   Procedure: CORONARY BALLOON ANGIOPLASTY;  Surgeon: Jettie Booze, MD;  Location: [place] [facility];  Service: Cardiovascular;  Laterality: N/A;  . CORONARY STENT INTERVENTION N/A 05/31/2017   Procedure: CORONARY STENT INTERVENTION;  Surgeon: Jettie Booze, MD;  Location: [place] [facility];  Service: Cardiovascular;  Laterality: N/A;  . CORONARY STENT INTERVENTION N/A 04/09/2018   Procedure: CORONARY STENT INTERVENTION;  Surgeon: Jettie Booze, MD;  Location: [place] [facility];  Service: Cardiovascular;  Laterality: N/A;  SVG RCA  . ENDARTERECTOMY Right 04/18/2013   Procedure: ENDARTERECTOMY CAROTID;  Surgeon: Mal Misty, MD;  Location: [place];  Service: Vascular;  Laterality: Right;  . HERNIA REPAIR  1989  . Incisional hernia repair x2  03/04/2010   Laparoscopic with  35cm mesh by Dr Ronnald Collum  . LEFT HEART CATH AND CORS/GRAFTS ANGIOGRAPHY N/A 05/31/2017   Procedure: LEFT HEART CATH AND CORS/GRAFTS ANGIOGRAPHY;  Surgeon: Jettie Booze, MD;  Location: [place] [facility];  Service: Cardiovascular;  Laterality: N/A;  . LEFT HEART CATH AND CORS/GRAFTS ANGIOGRAPHY N/A 04/08/2018   Procedure: LEFT HEART CATH AND CORS/GRAFTS ANGIOGRAPHY;  Surgeon: Jettie Booze, MD;  Location: [place] [facility];  Service: Cardiovascular;  Laterality: N/A;  . LEFT HEART CATHETERIZATION WITH CORONARY ANGIOGRAM N/A 12/19/2012   Procedure: LEFT  HEART CATHETERIZATION WITH CORONARY ANGIOGRAM;  Surgeon: Josue Hector, MD;  Location: [facility] CATH LAB;  Service: Cardiovascular;  Laterality: N/A;  . LEFT HEART CATHETERIZATION WITH CORONARY/GRAFT ANGIOGRAM N/A 04/19/2013   Procedure: LEFT HEART CATHETERIZATION WITH Beatrix Fetters;  Surgeon: Lorretta Harp, MD;  Location: [facility] CATH LAB;  Service: Cardiovascular;  Laterality: N/A;  . PATCH ANGIOPLASTY Right 04/18/2013   Procedure: PATCH ANGIOPLASTY Right Internal Carotid Artery;  Surgeon: Mal Misty, MD;  Location: [place];  Service: Vascular;  Laterality: Right;  . PERCUTANEOUS CORONARY STENT INTERVENTION (PCI-S) Right 12/19/2012   Procedure: PERCUTANEOUS CORONARY STENT INTERVENTION (PCI-S);  Surgeon: Josue Hector, MD;  Location: [facility] CATH LAB;  Service: Cardiovascular;  Laterality: Right;  . SHOULDER SURGERY       Inpatient Medications: Scheduled Meds: . ALPRAZolam  0.25 mg Oral BID  . ALPRAZolam  0.5 mg Oral QHS  . aspirin EC  81 mg Oral Daily  . atorvastatin  80 mg Oral QPM  . clopidogrel  75 mg Oral Daily  . ferrous sulfate  325 mg Oral Q lunch  . heparin  5,000 Units Subcutaneous Q8H  . insulin aspart  0-5 Units Subcutaneous QHS  . insulin aspart  0-9 Units Subcutaneous TID WC  . insulin glargine  10 Units Subcutaneous Daily  . levothyroxine  125 mcg Oral Daily  . pantoprazole  40 mg Oral BID  . ranolazine  500 mg Oral BID  . sodium bicarbonate  650 mg Oral BID  . [START ON 06/21/2020] Vitamin D (Ergocalciferol)  50,000 Units Oral Q Mon   Continuous Infusions: . calcium chloride  IV     PRN Meds: acetaminophen **OR** acetaminophen, albuterol, morphine injection, nitroGLYCERIN  Allergies:    Allergies  Allergen Reactions  . Penicillins Other (See Comments)    REACTION: Unknown, told as a child Has patient had a PCN reaction causing immediate rash, facial/tongue/throat swelling, SOB or lightheadedness with hypotension: Unknown Has patient had a PCN reaction causing  severe rash involving mucus membranes or skin necrosis: Unknown Has patient had a PCN reaction that required hospitalization: Unknown Has patient had a PCN reaction occurring within the last 10 years: No If all of the above answers are "NO", then may proceed with Cephalosporin use.     Social History:   Social History   Socioeconomic History  . Marital status: Divorced    Spouse name: Not on file  . Number of children: Not on file  . Years of education: Not on file  . Highest education level: Not on file  Occupational History  . Occupation: Disabled  Tobacco Use  . Smoking status: Former Smoker    Packs/day: 1.00    Years: 20.00    Pack years: 20.00    Types: Cigarettes    Quit date: 12/10/2012    Years since quitting: 7.5  . Smokeless tobacco: Never Used  Vaping Use  . Vaping Use: Never used  Substance  and Sexual Activity  . Alcohol use: No    Alcohol/week: 0.0 standard drinks  . Drug use: No  . Sexual activity: Not Currently  Other Topics Concern  . Not on file  Social History Narrative   Lives with mother.   Social Determinants of Health   Financial Resource Strain:   . Difficulty of Paying Living Expenses: Not on file  Food Insecurity:   . Worried About Charity fundraiser in the Last Year: Not on file  . Ran Out of Food in the Last Year: Not on file  Transportation Needs:   . Lack of Transportation (Medical): Not on file  . Lack of Transportation (Non-Medical): Not on file  Physical Activity:   . Days of Exercise per Week: Not on file  . Minutes of Exercise per Session: Not on file  Stress:   . Feeling of Stress : Not on file  Social Connections:   . Frequency of Communication with Friends and Family: Not on file  . Frequency of Social Gatherings with Friends and Family: Not on file  . Attends Religious Services: Not on file  . Active Member of Clubs or Organizations: Not on file  . Attends Archivist Meetings: Not on file  . Marital Status:  Not on file  Intimate Partner Violence:   . Fear of Current or Ex-Partner: Not on file  . Emotionally Abused: Not on file  . Physically Abused: Not on file  . Sexually Abused: Not on file    Family History:    Family History  Problem Relation Age of Onset  . Diabetes Mother   . Heart disease Mother        before age 63  . Hyperlipidemia Mother   . Hypertension Mother   . Thyroid disease Father   . Hypertension Father   . AAA (abdominal aortic aneurysm) Father   . Heart disease Brother        before age 68  . Hypertension Brother   . Hyperlipidemia Son   . Hypertension Son      ROS:  Please see the history of present illness.  All other ROS reviewed and negative.     Physical Exam/Data:   Vitals:   06/19/20 0555 06/19/20 0600 06/19/20 0640 06/19/20 0802  BP:  (!) 157/70 (!) 138/48 (!) 136/52  Pulse:  82 81 85  Resp:    18  Temp:    98.2 F (36.8 C)  TempSrc:    Oral  SpO2: 95%  97% (!) 88%  Weight:      Height:        Intake/Output Summary (Last 24 hours) at 06/19/2020 0836 Last data filed at 06/19/2020 0600 Gross per 24 hour  Intake --  Output 1250 ml  Net -1250 ml   Last 3 Weights 06/19/2020 06/18/2020 06/18/2020  Weight (lbs) 233 lb 14.5 oz 234 lb 12.6 oz 235 lb 14.3 oz  Weight (kg) 106.1 kg 106.5 kg 107 kg     Body mass index is 37.77 kg/m.  General:  Well nourished, well developed, in no acute distress HEENT: normal Lymph: no adenopathy Neck: no JVD Endocrine:  No thryomegaly Vascular: No carotid bruits; FA pulses 2+ bilaterally without bruits  Cardiac:  normal S1, S2; RRR Lungs:  Crackles bilaterally Abd: soft, nontender, no hepatomegaly  Ext: no edema Musculoskeletal:  No deformities, BUE and BLE strength normal and equal Skin: warm and dry  Neuro:  CNs 2-12 intact, no focal abnormalities noted Psych:  Normal affect       Laboratory Data:  High Sensitivity Troponin:   Recent Labs  Lab 06/13/20 0023 06/13/20 1452 06/13/20 1642  06/18/20 1543 06/18/20 2135  TROPONINIHS 39* 160* 141* 482* 955*     Chemistry Recent Labs  Lab 06/18/20 1726 06/18/20 2135 06/19/20 0236  NA 133* 135 135  K 6.7* 6.5* 5.7*  CL 102 103 100  CO2 19* 21* 21*  GLUCOSE 304* 290* 220*  BUN 110* 109* 110*  CREATININE 5.13* 5.57* 5.68*  CALCIUM 8.5* 8.8* 8.8*  GFRNONAA 8* 8* 7*  GFRAA 10* 9* 9*  ANIONGAP 12 11 14     Recent Labs  Lab 06/16/20 0604 06/17/20 0551 06/19/20 0236  PROT  --   --  5.5*  ALBUMIN 2.9* 2.9* 2.8*  AST  --   --  129*  ALT  --   --  211*  ALKPHOS  --   --  104  BILITOT  --   --  1.2   Hematology Recent Labs  Lab 06/17/20 0551 06/18/20 1543 06/19/20 0236  WBC 9.7 11.5* 13.3*  RBC 2.88* 3.00* 2.64*  HGB 8.7* 9.2* 7.7*  HCT 26.6* 27.5* 24.0*  MCV 92.4 91.7 90.9  MCH 30.2 30.7 29.2  MCHC 32.7 33.5 32.1  RDW 15.0 14.9 15.1  PLT 101* 88* 90*   BNP Recent Labs  Lab 06/18/20 1549  BNP 2,129.0*    DDimer No results for input(s): DDIMER in the last 168 hours.   Radiology/Studies:  DG Chest 1 View  Result Date: 06/18/2020 CLINICAL DATA:  Shortness of breath. Pt reports history of same during most recent admission. Hx diabetes, MI, HTN, CAD, asthma EXAM: CHEST  1 VIEW COMPARISON:  Chest x-ray 06/12/2020. FINDINGS: Stable cardiac enlargement that appears more prominent due to AP portable technique. Diffuse increase interstitial and airspace opacities. Possible bilateral pleural effusions. No pneumothorax. No acute osseous abnormality. IMPRESSION: Pulmonary findings suggestive of pulmonary edema versus atypical/viral pneumonia. Suggestion of bilateral pleural effusions. Consider lateral view for further evaluation. Electronically Signed   By: Iven Finn M.D.   On: 06/18/2020 16:46   CT Head Wo Contrast  Result Date: 06/18/2020 CLINICAL DATA:  Mental status change, unknown cause general weakness, groggy EXAM: CT HEAD WITHOUT CONTRAST TECHNIQUE: Contiguous axial images were obtained from the base  of the skull through the vertex without intravenous contrast. COMPARISON:  CT head 10/01/2018. FINDINGS: Brain: No evidence of acute infarction, hemorrhage, hydrocephalus, extra-axial collection or mass lesion/mass effect. Vascular: No hyperdense vessel or unexpected calcification. Skull: Negative for fracture or focal lesion. Sinuses/Orbits: No acute finding. Other: None. IMPRESSION: No acute intracranial abnormality. Electronically Signed   By: Iven Finn M.D.   On: 06/18/2020 16:37   NM Pulmonary Perfusion  Result Date: 06/16/2020 CLINICAL DATA:  Chest pain and hemoptysis EXAM: NUCLEAR MEDICINE PERFUSION LUNG SCAN TECHNIQUE: Perfusion images were obtained in multiple projections after intravenous injection of radiopharmaceutical. Views: Anterior, posterior, left lateral, right lateral, RPO, LPO, RAO, LAO RADIOPHARMACEUTICALS:  5.0 mCi Tc-46m MAA IV COMPARISON:  Chest radiograph June 12, 2020 FINDINGS: Radiotracer uptake is homogeneous and symmetric bilaterally. No perfusion defects evident. Heart noted to be prominent. IMPRESSION: No perfusion defects. No findings suggesting pulmonary embolus. Cardiac prominence noted. Electronically Signed   By: Lowella Grip III M.D.   On: 06/16/2020 13:09   US Venous Img Lower Bilateral (DVT)  Result Date: 06/16/2020 CLINICAL DATA:  Chest pain and hemoptysis EXAM: BILATERAL LOWER EXTREMITY VENOUS DOPPLER ULTRASOUND TECHNIQUE: Gray-scale sonography with  graded compression, as well as color Doppler and duplex ultrasound were performed to evaluate the lower extremity deep venous systems from the level of the common femoral vein and including the common femoral, femoral, profunda femoral, popliteal and calf veins including the posterior tibial, peroneal and gastrocnemius veins when visible. The superficial great saphenous vein was also interrogated. Spectral Doppler was utilized to evaluate flow at rest and with distal augmentation maneuvers in the common  femoral, femoral and popliteal veins. COMPARISON:  None. FINDINGS: RIGHT LOWER EXTREMITY Common Femoral Vein: No evidence of thrombus. Normal compressibility, respiratory phasicity and response to augmentation. Saphenofemoral Junction: No evidence of thrombus. Normal compressibility and flow on color Doppler imaging. Profunda Femoral Vein: No evidence of thrombus. Normal compressibility and flow on color Doppler imaging. Femoral Vein: No evidence of thrombus. Normal compressibility, respiratory phasicity and response to augmentation. Popliteal Vein: No evidence of thrombus. Normal compressibility, respiratory phasicity and response to augmentation. Calf Veins: No evidence of thrombus. Normal compressibility and flow on color Doppler imaging. Superficial Great Saphenous Vein: No evidence of thrombus. Normal compressibility. Venous Reflux:  None. Other Findings:  Calf edema is noted. LEFT LOWER EXTREMITY Common Femoral Vein: No evidence of thrombus. Normal compressibility, respiratory phasicity and response to augmentation. Saphenofemoral Junction: No evidence of thrombus. Normal compressibility and flow on color Doppler imaging. Profunda Femoral Vein: No evidence of thrombus. Normal compressibility and flow on color Doppler imaging. Femoral Vein: No evidence of thrombus. Normal compressibility, respiratory phasicity and response to augmentation. Popliteal Vein: No evidence of thrombus. Normal compressibility, respiratory phasicity and response to augmentation. Calf Veins: No evidence of thrombus. Normal compressibility and flow on color Doppler imaging. Superficial Great Saphenous Vein: No evidence of thrombus. Normal compressibility. Venous Reflux:  None. Other Findings:  Calf edema is noted. IMPRESSION: No evidence of deep venous thrombosis in either lower extremity. Electronically Signed   By: Inez Catalina M.D.   On: 06/16/2020 14:45   {  Assessment and Plan:   1. CAD/NSTEMI - complex history of CAD as  reported above - admit last week to [facility] with NSTEMI, managed medically due to advanced kidney disease - admitted again to [facility] yesterday with hyperkalemia, worsening renal function, fatigue. Overnight recurrent chest pains - trop trending back up, EKG chronic ST/T changes  - stop beta blocker due to bradycardia. Continue ASA, plavix (has been on at home), atorva 80. May restart nitro gtt if needed. With low platelets and uremia would be hesistant for hep gtt.  -if committed to HD could consider cath this admit.  - follow trop trends.  - we will restart nitro gtt. Would avoid hep with low platelets and uremia. Perhaps with HD and improved volume symptoms may also improve - with worsening renal function stop ranexa   2. AKI on CKD V - per renal   3. Hyperkalemia - per primary team  4. Bradycardia - in setting of electrolyte abnormalities, intermittent jucntional rhythm to 50s.  - was on low dose metoprolol at home, prior issues with brady on beta blockers, retried during last admit due to NSTEMI - stopped beta blocker, would not retry av nodal agents for her in the future.  - brady has resolved, NSR this AM.   5. Acute on chronic diastolic HF - Repeat limited echo, rather significant BNP elevation and pulm edema by CXR.  - significant volume overload, dose IV lasix 80mg  and oral metolazone 2.5mg  daily. Defer further fluid management to nephrology. SOB this AM and thus to try  to expedite some fluid removal I wrote for these doses this AM.    For questions or updates, please contact [place] Please consult [email or web address] for contact info under    Signed, Carlyle Dolly, MD  06/19/2020 8:36 AM     For questions or updates, please contact [place] Please consult [email or web address] for contact info under        Signed, Carlyle Dolly, MD  06/19/2020, 8:29 AM

## 2020-06-19 NOTE — Progress Notes (Signed)
Admissions MD paged via [place] them of patient's arrival. Awaiting callback.

## 2020-06-19 NOTE — Progress Notes (Signed)
Report given to Raquel Sarna, RN. Patient is alert and oriented x4 sitting up on side of bed with call light in reach. Recurrent chest pain noted since 0550. Pain is relieved with SL Nitro. Admissions MD paged overnight but no provider yet to physically see patient. Spoke with nephrology MD overnight and plan is to place temporary HD catheter and dialyze today.  Due to chest pain and history, patient may need cards consult.  WBC 13.3, Hgb 7.7, platelets 90, K+ 5.7, Creat 5.68 this AM.

## 2020-06-19 NOTE — Progress Notes (Signed)
Patient arrived to floor from [facility] via EMS transport. 4L of O2 in place. No distress noted. Patient states no pain and current VSS. Admitting MD to be paged and admission to be completed.

## 2020-06-19 NOTE — Consult Note (Signed)
[facility] Renal Consultation Note  Requesting MD: Elnora Morrison, MD Indication for Consultation: CKD progression, hyperkalemia  Chief complaint: fatigue  HPI: Donna Howe is a 64 y.o. female with a history including coronary artery disease, advanced CKD (biopsy-proven DM CKD) hypertension and type 2 diabetes mellitus who presented to the hospital at [facility] on 10/1 - she states that she was tired and slurring her words and her family called her and was worried.  Note that she had just recently been discharged from admission with NSTEMI on 9/30.  She reported fatigue and was found to have marked hyperkalemia with a K of 6.7.  Overnight early this morning around 5 she developed chest pain and cardiology has been consulted; she also had left arm pain and some nausea.  They are starting a nitro gtt.  She states that she has had chest pain like this off and on for about a week with her recent event.  She was seen by nephrology at her recent hospitalization and had been planning for access placement for dialysis initiation at some point soon however states she missed a pre-op appointment appointment due to her hospitalization and she states she is rescheduled for a couple of months from now.  Cardiology is planning on a cath next week unless needed emergently and at this time would recommend deferring permanent access placement until the cath.  Note that she was admitted within the last week to [facility] with an NSTEMI and managed medically at that time due to advanced kidney disease.  She had a GFR of 15 earlier this year in 12/2019.  She had 1.3 liters UOP over 10/1 charted.  Got lasix 80 mg IV overnight x 2.  She has been on 4 liters oxygen here and not on any at home.  Chest pain is 0/10 as of my exam on the nitro.  We discussed risks/benefits/indications for RRT and she consents to dialysis.  She thinks hemo would work best for her - worried about doing things right and had a prior  abd hernia s/p repair.  She states right leg always more swollen than left since CABG and legs actually look better than normal now because she's been off of her feet.    Creatinine, Ser  Date/Time Value Ref Range Status  06/19/2020 02:36 AM 5.68 (H) 0.44 - 1.00 mg/dL Final  06/18/2020 09:35 PM 5.57 (H) 0.44 - 1.00 mg/dL Final  06/18/2020 05:26 PM 5.13 (H) 0.44 - 1.00 mg/dL Final  06/18/2020 03:43 PM 5.18 (H) 0.44 - 1.00 mg/dL Final  06/17/2020 05:51 AM 4.43 (H) 0.44 - 1.00 mg/dL Final  06/16/2020 06:04 AM 4.50 (H) 0.44 - 1.00 mg/dL Final  06/15/2020 05:44 AM 4.60 (H) 0.44 - 1.00 mg/dL Final  06/15/2020 05:44 AM 4.64 (H) 0.44 - 1.00 mg/dL Final  06/14/2020 08:41 AM 4.49 (H) 0.44 - 1.00 mg/dL Final    Comment:    DELTA CHECK NOTED  06/14/2020 07:17 AM 0.58 0.44 - 1.00 mg/dL Final  06/13/2020 06:01 AM 4.64 (H) 0.44 - 1.00 mg/dL Final  06/12/2020 09:18 PM 4.77 (H) 0.44 - 1.00 mg/dL Final  12/19/2019 04:49 AM 3.22 (H) 0.44 - 1.00 mg/dL Final  12/18/2019 04:44 AM 3.18 (H) 0.44 - 1.00 mg/dL Final  12/17/2019 01:29 PM 3.47 (H) 0.44 - 1.00 mg/dL Final  10/03/2019 06:16 AM 2.67 (H) 0.44 - 1.00 mg/dL Final  10/02/2019 04:25 AM 2.82 (H) 0.44 - 1.00 mg/dL Final  10/01/2019 08:56 PM 3.02 (H) 0.44 - 1.00  mg/dL Final  04/20/2018 03:19 PM 2.16 (H) 0.44 - 1.00 mg/dL Final  04/20/2018 05:53 AM 2.25 (H) 0.44 - 1.00 mg/dL Final  04/19/2018 03:54 AM 1.84 (H) 0.44 - 1.00 mg/dL Final  04/18/2018 09:55 PM 1.91 (H) 0.44 - 1.00 mg/dL Final  04/10/2018 04:07 AM 1.77 (H) 0.44 - 1.00 mg/dL Final  04/09/2018 03:18 AM 1.73 (H) 0.44 - 1.00 mg/dL Final  04/08/2018 07:31 AM 1.71 (H) 0.44 - 1.00 mg/dL Final  04/07/2018 02:09 AM 1.83 (H) 0.44 - 1.00 mg/dL Final  04/04/2018 11:13 PM 1.97 (H) 0.44 - 1.00 mg/dL Final  06/01/2017 02:35 AM 1.59 (H) 0.44 - 1.00 mg/dL Final  05/31/2017 05:00 AM 1.74 (H) 0.44 - 1.00 mg/dL Final  05/30/2017 10:17 PM 1.82 (H) 0.44 - 1.00 mg/dL Final  08/13/2015 08:30 PM 1.55 (H) 0.44 - 1.00  mg/dL Final  10/29/2013 04:58 AM 1.96 (H) 0.50 - 1.10 mg/dL Final  10/28/2013 04:53 AM 2.41 (H) 0.50 - 1.10 mg/dL Final  10/27/2013 04:53 AM 2.85 (H) 0.50 - 1.10 mg/dL Final  10/26/2013 01:54 PM 2.70 (H) 0.50 - 1.10 mg/dL Final  04/20/2013 10:50 AM 1.37 (H) 0.50 - 1.10 mg/dL Final  04/19/2013 04:30 AM 1.47 (H) 0.50 - 1.10 mg/dL Final  04/15/2013 02:25 PM 1.59 (H) 0.50 - 1.10 mg/dL Final  12/20/2012 05:03 AM 1.38 (H) 0.50 - 1.10 mg/dL Final  12/19/2012 07:40 AM 1.35 (H) 0.50 - 1.10 mg/dL Final  02/21/2012 12:04 AM 1.13 (H) 0.50 - 1.10 mg/dL Final  03/06/2010 11:00 AM 1.68 (H) 0.40 - 1.20 mg/dL Final  02/28/2010 10:08 AM 1.35 (H) 0.40 - 1.20 mg/dL Final  01/09/2010 04:05 AM 1.49 (H) 0.40 - 1.20 mg/dL Final  01/08/2010 05:05 AM 1.50 (H) 0.40 - 1.20 mg/dL Final  01/07/2010 04:14 PM 1.57 (H) 0.40 - 1.20 mg/dL Final  01/06/2010 06:08 AM 1.34 (H) 0.40 - 1.20 mg/dL Final  01/05/2010 03:40 AM 1.39 (H) 0.40 - 1.20 mg/dL Final  01/04/2010 06:00 AM 1.64 (H) 0.40 - 1.20 mg/dL Final  01/03/2010 01:05 PM 1.46 (H) 0.40 - 1.20 mg/dL Final  09/18/2008 01:08 AM 1.48 (H) 0.40 - 1.20 mg/dL Final  01/06/2008 10:10 PM 1.99 (H)  Final    PMHx:   Past Medical History:  Diagnosis Date  . Anemia   . Asthma   . CAD (coronary artery disease)    Multivessel s/p CABG 2005, numerous PCIs since that time  . Carotid artery disease (HCC)    R CEA  . Chronic kidney disease (CKD), stage III (moderate) (HCC)   . Essential hypertension   . Gout   . Hyperlipidemia   . Hypothyroidism   . Myocardial infarction ([place])   . PAD (peripheral artery disease) ([place])    Dr. Kellie Simmering  . S/P angioplasty with stent- DES to [facility] and to [facility] to LAD with DES 04/09/18.   04/10/2018  . SBO (small bowel obstruction) ([place]) 2011   Status post lysis of adhesions & hernia repair  . Sinus bradycardia   . Thrombocytopenia, unspecified ([place])   . Type 2 diabetes mellitus ([place])   . Umbilical hernia     Past Surgical History:  Procedure  Laterality Date  . CESAREAN SECTION  1984  . CHOLECYSTECTOMY  2010  . CORONARY ARTERY BYPASS GRAFT  2005  . CORONARY BALLOON ANGIOPLASTY N/A 05/31/2017   Procedure: CORONARY BALLOON ANGIOPLASTY;  Surgeon: Jettie Booze, MD;  Location: [place] [facility];  Service: Cardiovascular;  Laterality: N/A;  . CORONARY STENT INTERVENTION N/A 05/31/2017  Procedure: CORONARY STENT INTERVENTION;  Surgeon: Jettie Booze, MD;  Location: [place] [facility];  Service: Cardiovascular;  Laterality: N/A;  . CORONARY STENT INTERVENTION N/A 04/09/2018   Procedure: CORONARY STENT INTERVENTION;  Surgeon: Jettie Booze, MD;  Location: [place] [facility];  Service: Cardiovascular;  Laterality: N/A;  SVG RCA  . ENDARTERECTOMY Right 04/18/2013   Procedure: ENDARTERECTOMY CAROTID;  Surgeon: Mal Misty, MD;  Location: [place];  Service: Vascular;  Laterality: Right;  . HERNIA REPAIR  1989  . Incisional hernia repair x2  03/04/2010   Laparoscopic with 35cm mesh by Dr Ronnald Collum  . LEFT HEART CATH AND CORS/GRAFTS ANGIOGRAPHY N/A 05/31/2017   Procedure: LEFT HEART CATH AND CORS/GRAFTS ANGIOGRAPHY;  Surgeon: Jettie Booze, MD;  Location: [place] [facility];  Service: Cardiovascular;  Laterality: N/A;  . LEFT HEART CATH AND CORS/GRAFTS ANGIOGRAPHY N/A 04/08/2018   Procedure: LEFT HEART CATH AND CORS/GRAFTS ANGIOGRAPHY;  Surgeon: Jettie Booze, MD;  Location: [place] [facility];  Service: Cardiovascular;  Laterality: N/A;  . LEFT HEART CATHETERIZATION WITH CORONARY ANGIOGRAM N/A 12/19/2012   Procedure: LEFT HEART CATHETERIZATION WITH CORONARY ANGIOGRAM;  Surgeon: Josue Hector, MD;  Location: [facility] CATH LAB;  Service: Cardiovascular;  Laterality: N/A;  . LEFT HEART CATHETERIZATION WITH CORONARY/GRAFT ANGIOGRAM N/A 04/19/2013   Procedure: LEFT HEART CATHETERIZATION WITH Beatrix Fetters;  Surgeon: Lorretta Harp, MD;  Location: [facility] CATH LAB;  Service: Cardiovascular;  Laterality: N/A;  . PATCH  ANGIOPLASTY Right 04/18/2013   Procedure: PATCH ANGIOPLASTY Right Internal Carotid Artery;  Surgeon: Mal Misty, MD;  Location: [place];  Service: Vascular;  Laterality: Right;  . PERCUTANEOUS CORONARY STENT INTERVENTION (PCI-S) Right 12/19/2012   Procedure: PERCUTANEOUS CORONARY STENT INTERVENTION (PCI-S);  Surgeon: Josue Hector, MD;  Location: [facility] CATH LAB;  Service: Cardiovascular;  Laterality: Right;  . SHOULDER SURGERY      Family Hx:  Family History  Problem Relation Age of Onset  . Diabetes Mother   . Heart disease Mother        before age 64  . Hyperlipidemia Mother   . Hypertension Mother   . Thyroid disease Father   . Hypertension Father   . AAA (abdominal aortic aneurysm) Father   . Heart disease Brother        before age 93  . Hypertension Brother   . Hyperlipidemia Son   . Hypertension Son     Social History:  reports that she quit smoking about 7 years ago. Her smoking use included cigarettes. She has a 20.00 pack-year smoking history. She has never used smokeless tobacco. She reports that she does not drink alcohol and does not use drugs.  Allergies:  Allergies  Allergen Reactions  . Penicillins Other (See Comments)    REACTION: Unknown, told as a child Has patient had a PCN reaction causing immediate rash, facial/tongue/throat swelling, SOB or lightheadedness with hypotension: Unknown Has patient had a PCN reaction causing severe rash involving mucus membranes or skin necrosis: Unknown Has patient had a PCN reaction that required hospitalization: Unknown Has patient had a PCN reaction occurring within the last 10 years: No If all of the above answers are "NO", then may proceed with Cephalosporin use.     Medications: Prior to Admission medications   Medication Sig Start Date End Date Taking? Authorizing Provider  Albuterol Sulfate 108 (90 Base) MCG/ACT AEPB Inhale 2 puffs into the lungs every 4 (four) hours as needed (Shortness of breath).  Yes [provider]  ALPRAZolam Duanne Moron) 0.5 MG tablet Take 0.25-0.5 mg by mouth See admin instructions. Take 0.25 mg tab by mouth every morning & evening as needed  and .05 mg tab at bedtime   Yes [provider]  aspirin EC 81 MG tablet Take 81 mg by mouth daily.    Yes [provider]  atorvastatin (LIPITOR) 80 MG tablet Take 1 tablet (80 mg total) by mouth every evening. 12/19/19  Yes Johnson, Clanford L, MD  benzonatate (TESSALON) 100 MG capsule Take 100 mg by mouth 3 (three) times daily as needed for cough.   Yes [provider]  clopidogrel (PLAVIX) 75 MG tablet Take 1 tablet (75 mg total) by mouth daily. 08/27/18  Yes Satira Sark, MD  ferrous sulfate 325 (65 FE) MG tablet Take 325 mg by mouth daily with breakfast.   Yes [provider]  gabapentin (NEURONTIN) 400 MG capsule Take 1 capsule (400 mg total) by mouth 2 (two) times daily. 06/17/20   Barton Dubois, MD  ipratropium (ATROVENT HFA) 17 MCG/ACT inhaler Inhale 2 puffs into the lungs See admin instructions. Inhale 2 puffs every 4 to 6 hrs as needed.    [provider]  levothyroxine (SYNTHROID, LEVOTHROID) 125 MCG tablet Take 125 mcg by mouth daily.     [provider]  metoprolol tartrate (LOPRESSOR) 25 MG tablet Take 0.5 tablets (12.5 mg total) by mouth 2 (two) times daily. 06/17/20   Barton Dubois, MD  nitroGLYCERIN (NITROSTAT) 0.4 MG SL tablet Place 0.4 mg under the tongue every 5 (five) minutes as needed for chest pain. Not to exceed 3 in 15 minute time frame    [provider]  NOVOLIN 70/30 RELION (70-30) 100 UNIT/ML injection Inject 29 Units into the skin 2 (two) times daily with a meal. Am & PM 08/28/15   [provider]  ondansetron (ZOFRAN ODT) 8 MG disintegrating tablet Take 1 tablet (8 mg total) by mouth every 8 (eight) hours as needed for nausea or vomiting. 08/14/15   Evalee Jefferson, PA-C  pantoprazole (PROTONIX) 40 MG tablet Take 1 tablet (40 mg total) by  mouth 2 (two) times daily. 06/17/20   Barton Dubois, MD  ranolazine (RANEXA) 500 MG 12 hr tablet Take 1 tablet (500 mg total) by mouth 2 (two) times daily. 06/17/20   Barton Dubois, MD  sodium bicarbonate 650 MG tablet Take 650 mg by mouth 2 (two) times daily.    [provider]  Vitamin D, Ergocalciferol, (DRISDOL) 1.25 MG (50000 UNIT) CAPS capsule Take 50,000 Units by mouth once a week. 12/31/19   [provider]    I have reviewed the patient's current and reported prior to admission medications.  Labs:  BMP Latest Ref Rng & Units 06/19/2020 06/18/2020 06/18/2020  Glucose 70 - 99 mg/dL 220(H) 290(H) 304(H)  BUN 8 - 23 mg/dL 110(H) 109(H) 110(H)  Creatinine 0.44 - 1.00 mg/dL 5.68(H) 5.57(H) 5.13(H)  Sodium 135 - 145 mmol/L 135 135 133(L)  Potassium 3.5 - 5.1 mmol/L 5.7(H) 6.5(HH) 6.7(HH)  Chloride 98 - 111 mmol/L 100 103 102  CO2 22 - 32 mmol/L 21(L) 21(L) 19(L)  Calcium 8.9 - 10.3 mg/dL 8.8(L) 8.8(L) 8.5(L)    Urinalysis    Component Value Date/Time   COLORURINE YELLOW 06/18/2020 1536   APPEARANCEUR CLOUDY (A) 06/18/2020 1536   LABSPEC 1.015 06/18/2020 1536   PHURINE 5.0 06/18/2020 1536   GLUCOSEU 150 (A) 06/18/2020 1536   HGBUR NEGATIVE 06/18/2020 1536  BILIRUBINUR NEGATIVE 06/18/2020 [place] 06/18/2020 1536   PROTEINUR >=300 (A) 06/18/2020 1536   UROBILINOGEN 0.2 10/26/2013 2229   NITRITE NEGATIVE 06/18/2020 1536   LEUKOCYTESUR NEGATIVE 06/18/2020 1536     ROS:  Pertinent items noted in HPI and remainder of comprehensive ROS otherwise negative.    Physical Exam: Vitals:   06/19/20 0640 06/19/20 0802  BP: (!) 138/48 (!) 136/52  Pulse: 81 85  Resp:  18  Temp:  98.2 F (36.8 C)  SpO2: 97% (!) 88%     General: adult female in bed in NAD at rest HEENT: NCAT Eyes: EOMI sclera anicteric Neck: supple trachea midline Heart: S1S2 no rub Lungs: clear but reduced; unlabored at rest; on 4 liters oxygen  Abdomen: softly dist/obese  habitus/nontender Extremities: right leg 1-2+ edema and left leg with trace to 1+ edema Skin: no rash on extremities exposed Psych normal mood and affect Neuro: alert and oriented x 3; provides hx and follows commands  Assessment/Plan:  # NSTEMI  - Discussed with cardiology.  Timing of heart catheterization per cardiology discretion - ok at any time from a renal standpoint - note cardiology has stopped beta blocker due to bradycardia - nitro per cardiology  # CKD stage V with progression to end-stage renal disease - Tunneled dialysis catheter placement for initiation of hemodialysis  - HD today after tunneled catheter  - hold on permanent access in setting of NSTEMI and would proceed after her heart cath if ok per cardiology - She will need CLIP to outpatient HD unit   # Hyperkalemia - acute worsening worrisome with setting of NSTEMI - Currently improving with medical management; note setting of hx NSTEMI currently medically managed - Update potassium   # Acute on chronic diastolic CHF  - Lasix 80 mg IV once this AM and metolazone 5 mg PO 30 minutes prior to this AM's lasix   # HTN  - note nitro gtt as above for chest pain and beta blocker was stopped for bradycardia   # Anemia of CKD  - Defer ESA currently in setting of NSTEMI - Transfuse as needed   Claudia Desanctis 06/19/2020, 10:11 AM

## 2020-06-19 NOTE — Progress Notes (Signed)
Pt with ongoing mid sternal chest pain radiating to the L arm, temporarily relieved with nitroglycerin x3. EKG obtained, MD paged. Awaiting orders. Will continue to monitor.   Lenna Sciara, RN, BSN

## 2020-06-19 NOTE — Progress Notes (Signed)
PROGRESS NOTE    Donna Howe   PXT:[id number]  DOB: 01-07-1956  DOA: 06/18/2020 PCP: [facility], [facility]   Brief Narrative:  Donna Howe  is a 64 y.o. female, with a history of diabetes mellitus type 2, hyperlipidemia, hypertension, CKD stage V, coronary artery diseasewith NSTEMI and DES, right carotid stenosis, peripheral arterial disease, patient with recent hospitalization at [facility] for NSTEMI, was treated with heparin GTT, started on low-dose metoprolol, she was just discharged on 9/30.  She returns on 10/1 to [facility] for weakness, mild confusion and slurred speech.  In ED > bradycardic with HR in 40s, K 6.7. cr 5.18.   Given Atropine, Bicarb, Calcium gluconate, Kayexalate, Insulin. Transferred from [facility] to [facility] to start dialysis.    Subjective: Nausea and weakness. Pain in left chest wall on and off radiating to left arm.     Assessment & Plan:   Principal Problem:   Acute renal failure superimposed on stage 5 chronic kidney disease, not on chronic dialysis - she has signs of uremia with nausea, poor appetite, fatigue - nephology plans on hemodialysis once dialysis access placed  Active Problems:   Hyperkalemia with Bradycardia - resolved with treatment   NSTEMI with extensive CAD and h/o CABG - she had an NSTEMI on 9/26 but due to CKD5, did not undergo a cath - Troponin rising to 900s today - having left sided chest pain and is now requiring a Nitro infusion and PRN Morphine - cardiology consulting -cardiology holding off on heparin in setting of thrombocytopenia and uremia to prevent bleeding - cont Aspirin, Lipitor, Plavix - 2 D ECHO today does not show and WMA  Acute on Chronic diastolic CHF - she is fluid overloaded - IV diuretics per cardiology and nephrology    Diabetes mellitus2 with renal manifestation  - uses 70/30 Novolin (Relion) at home- d/c this - A1c 6.2 - SSI and lantus ordered        Time spent in minutes:  35 DVT prophylaxis: heparin injection 5,000 Units Start: 06/19/20 1400 SCDs Start: 06/18/20 2154  Code Status: full code Family Communication:  Disposition Plan:  Status is: Inpatient  Remains inpatient appropriate because:AKI, NSTEMI and Acute CHF   Dispo: The patient is from: Home              Anticipated d/c is to: Home              Anticipated d/c date is: > 3 days              Patient currently is not medically stable to d/c.      Consultants:   Nephrology  Cardiology  Procedures:  . Left ventricular ejection fraction, by estimation, is 55 to 60%. The  left ventricle has normal function. The left ventricle has no regional  wall motion abnormalities.  2. The mitral valve is normal in structure. Mild to moderate mitral valve  regurgitation. No evidence of mitral stenosis.  3. Limited echocardiogram to reevalaute LV function and wall motion.   Antimicrobials:  Anti-infectives (From admission, onward)   None       Objective: Vitals:   06/19/20 0555 06/19/20 0600 06/19/20 0640 06/19/20 0802  BP:  (!) 157/70 (!) 138/48 (!) 136/52  Pulse:  82 81 85  Resp:    18  Temp:    98.2 F (36.8 C)  TempSrc:    Oral  SpO2: 95%  97% (!) 88%  Weight:  Height:        Intake/Output Summary (Last 24 hours) at 06/19/2020 1725 Last data filed at 06/19/2020 1600 Gross per 24 hour  Intake --  Output 2425 ml  Net -2425 ml   Filed Weights   06/18/20 1517 06/18/20 1953 06/19/20 0400  Weight: 107 kg 106.5 kg 106.1 kg    Examination: General exam: Appears comfortable  HEENT: PERRLA, oral mucosa moist, no sclera icterus or thrush Respiratory system: crackles in b/l lung fields. Respiratory effort normal. Cardiovascular system: S1 & S2 heard, RRR.   Gastrointestinal system: Abdomen soft, non-tender, nondistended. Normal bowel sounds. Central nervous system: Alert and oriented. No focal neurological deficits. Extremities: No cyanosis, clubbing - + pedal edema Skin: No  rashes or ulcers Psychiatry:  Mood & affect appropriate.     Data Reviewed: I have personally reviewed following labs and imaging studies  CBC: Recent Labs  Lab 06/14/20 0841 06/14/20 0841 06/15/20 0544 06/16/20 0604 06/17/20 0551 06/18/20 1543 06/19/20 0236  WBC 9.9   < > 9.0 10.0 9.7 11.5* 13.3*  NEUTROABS 7.5  --   --   --   --  9.7*  --   HGB 9.8*   < > 8.4* 8.9* 8.7* 9.2* 7.7*  HCT 30.9*   < > 26.7* 28.8* 26.6* 27.5* 24.0*  MCV 96.0   < > 93.7 95.0 92.4 91.7 90.9  PLT 144*   < > 120* 123* 101* 88* 90*   < > = values in this interval not displayed.   Basic Metabolic Panel: Recent Labs  Lab 06/14/20 0717 06/14/20 0717 06/14/20 0841 06/14/20 0841 06/15/20 0544 06/15/20 0544 06/16/20 0604 06/16/20 0604 06/17/20 0551 06/17/20 0551 06/18/20 1543 06/18/20 1726 06/18/20 2135 06/19/20 0236 06/19/20 1038  NA 137   < > 137   < > 134*  134*   < > 136   < > 137  --  132* 133* 135 135  --   K 3.8   < > 4.5   < > 5.3*  5.3*   < > 5.4*   < > 5.3*   < > 6.7* 6.7* 6.5* 5.7* 4.7  CL 98   < > 105   < > 105  104   < > 106   < > 106  --  101 102 103 100  --   CO2 32   < > 22   < > 21*  21*   < > 23   < > 24  --  20* 19* 21* 21*  --   GLUCOSE 176*   < > 181*   < > 254*  254*   < > 125*   < > 118*  --  306* 304* 290* 220*  --   BUN 19   < > 81*   < > 90*  89*   < > 92*   < > 91*  --  106* 110* 109* 110*  --   CREATININE 0.58   < > 4.49*   < > 4.60*  4.64*   < > 4.50*   < > 4.43*  --  5.18* 5.13* 5.57* 5.68*  --   CALCIUM 7.7*   < > 8.1*   < > 8.1*  8.1*   < > 8.3*   < > 8.4*  --  8.6* 8.5* 8.8* 8.8*  --   MG  --   --   --   --  2.1  --   --   --   --   --  2.6*  --   --   --   --   PHOS 2.3*  --  5.7*  --  4.8*  --  4.1  --  4.0  --   --   --   --   --   --    < > = values in this interval not displayed.   GFR: Estimated Creatinine Clearance: 12.8 mL/min (A) (by C-G formula based on SCr of 5.68 mg/dL (H)). Liver Function Tests: Recent Labs  Lab 06/14/20 0841  06/15/20 0544 06/16/20 0604 06/17/20 0551 06/19/20 0236  AST  --   --   --   --  129*  ALT  --   --   --   --  211*  ALKPHOS  --   --   --   --  104  BILITOT  --   --   --   --  1.2  PROT  --   --   --   --  5.5*  ALBUMIN 2.9* 2.8* 2.9* 2.9* 2.8*   No results for input(s): LIPASE, AMYLASE in the last 168 hours. No results for input(s): AMMONIA in the last 168 hours. Coagulation Profile: No results for input(s): INR, PROTIME in the last 168 hours. Cardiac Enzymes: No results for input(s): CKTOTAL, CKMB, CKMBINDEX, TROPONINI in the last 168 hours. BNP (last 3 results) No results for input(s): PROBNP in the last 8760 hours. HbA1C: No results for input(s): HGBA1C in the last 72 hours. CBG: Recent Labs  Lab 06/18/20 2048 06/18/20 2347 06/19/20 0801 06/19/20 1358 06/19/20 1703  GLUCAP 271* 255* 257* 182* 197*   Lipid Profile: No results for input(s): CHOL, HDL, LDLCALC, TRIG, CHOLHDL, LDLDIRECT in the last 72 hours. Thyroid Function Tests: Recent Labs    06/18/20 1543  TSH 1.057   Anemia Panel: No results for input(s): VITAMINB12, FOLATE, FERRITIN, TIBC, IRON, RETICCTPCT in the last 72 hours. Urine analysis:    Component Value Date/Time   COLORURINE YELLOW 06/18/2020 1536   APPEARANCEUR CLOUDY (A) 06/18/2020 1536   LABSPEC 1.015 06/18/2020 1536   PHURINE 5.0 06/18/2020 1536   GLUCOSEU 150 (A) 06/18/2020 1536   HGBUR NEGATIVE 06/18/2020 [place] 06/18/2020 1536   KETONESUR NEGATIVE 06/18/2020 1536   PROTEINUR >=300 (A) 06/18/2020 1536   UROBILINOGEN 0.2 10/26/2013 2229   NITRITE NEGATIVE 06/18/2020 1536   LEUKOCYTESUR NEGATIVE 06/18/2020 1536   Sepsis Labs: @LABRCNTIP (procalcitonin:4,lacticidven:4) ) Recent Results (from the past 240 hour(s))  Respiratory Panel by RT PCR (Flu A&B, Covid) - Nasopharyngeal Swab     Status: None   Collection Time: 06/12/20 10:57 PM   Specimen: Nasopharyngeal Swab  Result Value Ref Range Status   SARS  Coronavirus 2 by RT PCR NEGATIVE NEGATIVE Final    Comment: (NOTE) SARS-CoV-2 target nucleic acids are NOT DETECTED.  The SARS-CoV-2 RNA is generally detectable in upper respiratoy specimens during the acute phase of infection. The lowest concentration of SARS-CoV-2 viral copies this assay can detect is 131 copies/mL. A negative result does not preclude SARS-Cov-2 infection and should not be used as the sole basis for treatment or other patient management decisions. A negative result may occur with  improper specimen collection/handling, submission of specimen other than nasopharyngeal swab, presence of viral mutation(s) within the areas targeted by this assay, and inadequate number of viral copies (<131 copies/mL). A negative result must be combined with clinical observations, patient history, and epidemiological information. The expected result is Negative.  Fact Sheet for Patients:  PinkCheek.be  Fact Sheet for Healthcare Providers:  GravelBags.it  This test is no t yet approved or cleared by the [place] FDA and  has been authorized for detection and/or diagnosis of SARS-CoV-2 by FDA under an Emergency Use Authorization (EUA). This EUA will remain  in effect (meaning this test can be used) for the duration of the COVID-19 declaration under Section 564(b)(1) of the Act, 21 U.S.C. section 360bbb-3(b)(1), unless the authorization is terminated or revoked sooner.     Influenza A by PCR NEGATIVE NEGATIVE Final   Influenza B by PCR NEGATIVE NEGATIVE Final    Comment: (NOTE) The Xpert Xpress SARS-CoV-2/FLU/RSV assay is intended as an aid in  the diagnosis of influenza from Nasopharyngeal swab specimens and  should not be used as a sole basis for treatment. Nasal washings and  aspirates are unacceptable for Xpert Xpress SARS-CoV-2/FLU/RSV  testing.  Fact Sheet for  Patients: PinkCheek.be  Fact Sheet for Healthcare Providers: GravelBags.it  This test is not yet approved or cleared by the [place] FDA and  has been authorized for detection and/or diagnosis of SARS-CoV-2 by  FDA under an Emergency Use Authorization (EUA). This EUA will remain  in effect (meaning this test can be used) for the duration of the  Covid-19 declaration under Section 564(b)(1) of the Act, 21  U.S.C. section 360bbb-3(b)(1), unless the authorization is  terminated or revoked. Performed at [facility], [address], [place], [place] [address]   MRSA PCR Screening     Status: None   Collection Time: 06/13/20  6:51 PM   Specimen: Nasopharyngeal  Result Value Ref Range Status   MRSA by PCR NEGATIVE NEGATIVE Final    Comment:        The GeneXpert MRSA Assay (FDA approved for NASAL specimens only), is one component of a comprehensive MRSA colonization surveillance program. It is not intended to diagnose MRSA infection nor to guide or monitor treatment for MRSA infections. Performed at [facility], [address], [place], [place] [address]   Respiratory Panel by RT PCR (Flu A&B, Covid) - Nasopharyngeal Swab     Status: None   Collection Time: 06/18/20  3:34 PM   Specimen: Nasopharyngeal Swab  Result Value Ref Range Status   SARS Coronavirus 2 by RT PCR NEGATIVE NEGATIVE Final    Comment: (NOTE) SARS-CoV-2 target nucleic acids are NOT DETECTED.  The SARS-CoV-2 RNA is generally detectable in upper respiratoy specimens during the acute phase of infection. The lowest concentration of SARS-CoV-2 viral copies this assay can detect is 131 copies/mL. A negative result does not preclude SARS-Cov-2 infection and should not be used as the sole basis for treatment or other patient management decisions. A negative result may occur with  improper specimen collection/handling, submission of specimen other than  nasopharyngeal swab, presence of viral mutation(s) within the areas targeted by this assay, and inadequate number of viral copies (<131 copies/mL). A negative result must be combined with clinical observations, patient history, and epidemiological information. The expected result is Negative.  Fact Sheet for Patients:  PinkCheek.be  Fact Sheet for Healthcare Providers:  GravelBags.it  This test is no t yet approved or cleared by the [place] FDA and  has been authorized for detection and/or diagnosis of SARS-CoV-2 by FDA under an Emergency Use Authorization (EUA). This EUA will remain  in effect (meaning this test can be used) for the duration of the COVID-19 declaration under Section 564(b)(1) of the Act, 21 U.S.C. section 360bbb-3(b)(1), unless the authorization  is terminated or revoked sooner.     Influenza A by PCR NEGATIVE NEGATIVE Final   Influenza B by PCR NEGATIVE NEGATIVE Final    Comment: (NOTE) The Xpert Xpress SARS-CoV-2/FLU/RSV assay is intended as an aid in  the diagnosis of influenza from Nasopharyngeal swab specimens and  should not be used as a sole basis for treatment. Nasal washings and  aspirates are unacceptable for Xpert Xpress SARS-CoV-2/FLU/RSV  testing.  Fact Sheet for Patients: PinkCheek.be  Fact Sheet for Healthcare Providers: GravelBags.it  This test is not yet approved or cleared by the [place] FDA and  has been authorized for detection and/or diagnosis of SARS-CoV-2 by  FDA under an Emergency Use Authorization (EUA). This EUA will remain  in effect (meaning this test can be used) for the duration of the  Covid-19 declaration under Section 564(b)(1) of the Act, 21  U.S.C. section 360bbb-3(b)(1), unless the authorization is  terminated or revoked. Performed at [facility], [address] [place], [place] [address]           Radiology Studies: DG Chest 1 View  Result Date: 06/18/2020 CLINICAL DATA:  Shortness of breath. Pt reports history of same during most recent admission. Hx diabetes, MI, HTN, CAD, asthma EXAM: CHEST  1 VIEW COMPARISON:  Chest x-ray 06/12/2020. FINDINGS: Stable cardiac enlargement that appears more prominent due to AP portable technique. Diffuse increase interstitial and airspace opacities. Possible bilateral pleural effusions. No pneumothorax. No acute osseous abnormality. IMPRESSION: Pulmonary findings suggestive of pulmonary edema versus atypical/viral pneumonia. Suggestion of bilateral pleural effusions. Consider lateral view for further evaluation. Electronically Signed   By: Iven Finn M.D.   On: 06/18/2020 16:46   CT Head Wo Contrast  Result Date: 06/18/2020 CLINICAL DATA:  Mental status change, unknown cause general weakness, groggy EXAM: CT HEAD WITHOUT CONTRAST TECHNIQUE: Contiguous axial images were obtained from the base of the skull through the vertex without intravenous contrast. COMPARISON:  CT head 10/01/2018. FINDINGS: Brain: No evidence of acute infarction, hemorrhage, hydrocephalus, extra-axial collection or mass lesion/mass effect. Vascular: No hyperdense vessel or unexpected calcification. Skull: Negative for fracture or focal lesion. Sinuses/Orbits: No acute finding. Other: None. IMPRESSION: No acute intracranial abnormality. Electronically Signed   By: Iven Finn M.D.   On: 06/18/2020 16:37   ECHOCARDIOGRAM LIMITED  Result Date: 06/19/2020    ECHOCARDIOGRAM LIMITED REPORT   Patient Name:   SALAM CHESTERFIELD Date of Exam: 06/19/2020 Medical Rec #:  [id number]        Height:       66.0 in Accession #:    [id number]       Weight:       233.9 lb Date of Birth:  March 23, 1956       BSA:          2.137 m Patient Age:    45 years         BP:           136/52 mmHg Patient Gender: F                HR:           85 bpm. Exam Location:  Inpatient Procedure: Color Doppler,  Limited Echo and Intracardiac Opacification Agent Indications:    Chest pain  History:        Patient has prior history of Echocardiogram examinations, most                 recent 06/13/2020. CAD and Previous  Myocardial Infarction, Prior                 CABG; Risk Factors:Hypertension, Diabetes, Dyslipidemia and                 Current Smoker. CKD.  Sonographer:    Clayton Lefort RDCS (AE) Referring Phys: [id number] Arnoldo Lenis  Sonographer Comments: Patient is morbidly obese. Image acquisition challenging due to patient body habitus. IMPRESSIONS  1. Left ventricular ejection fraction, by estimation, is 55 to 60%. The left ventricle has normal function. The left ventricle has no regional wall motion abnormalities.  2. The mitral valve is normal in structure. Mild to moderate mitral valve regurgitation. No evidence of mitral stenosis.  3. Limited echocardiogram to reevalaute LV function and wall motion. FINDINGS  Left Ventricle: Left ventricular ejection fraction, by estimation, is 55 to 60%. The left ventricle has normal function. The left ventricle has no regional wall motion abnormalities. Definity contrast agent was given IV to delineate the left ventricular  endocardial borders. Mitral Valve: The mitral valve is normal in structure. Mild to moderate mitral valve regurgitation. No evidence of mitral valve stenosis. LEFT VENTRICLE PLAX 2D LVIDd:         5.10 cm LVIDs:         3.40 cm LV PW:         1.50 cm LV IVS:        1.50 cm LVOT diam:     1.70 cm LVOT Area:     2.27 cm  LEFT ATRIUM         Index LA diam:    3.90 cm 1.82 cm/m   AORTA Ao Root diam: 3.30 cm  SHUNTS Systemic Diam: 1.70 cm Carlyle Dolly MD Electronically signed by Carlyle Dolly MD Signature Date/Time: 06/19/2020/3:14:35 PM    Final       Scheduled Meds: . ALPRAZolam  0.25 mg Oral BID  . ALPRAZolam  0.5 mg Oral QHS  . aspirin EC  81 mg Oral Daily  . atorvastatin  80 mg Oral QPM  . Chlorhexidine Gluconate Cloth  6 each Topical Q0600  .  clopidogrel  75 mg Oral Daily  . ferrous sulfate  325 mg Oral Q lunch  . heparin  5,000 Units Subcutaneous Q8H  . insulin aspart  0-5 Units Subcutaneous QHS  . insulin aspart  0-9 Units Subcutaneous TID WC  . insulin glargine  10 Units Subcutaneous Daily  . levothyroxine  125 mcg Oral Daily  . pantoprazole  40 mg Oral BID  . sodium bicarbonate  650 mg Oral BID  . [START ON 06/21/2020] Vitamin D (Ergocalciferol)  50,000 Units Oral Q Mon   Continuous Infusions: . calcium chloride  IV    . nitroGLYCERIN 5 mcg/min (06/19/20 0936)     LOS: 1 day      Debbe Odea, MD [facility] Pager: [email or web address] 06/19/2020, 5:25 PM

## 2020-06-19 NOTE — Progress Notes (Signed)
°  Echocardiogram 2D Echocardiogram has been performed.  Donna Howe 06/19/2020, 3:12 PM

## 2020-06-20 LAB — CBC
HCT: 23.2 % — ABNORMAL LOW (ref 36.0–46.0)
Hemoglobin: 7.4 g/dL — ABNORMAL LOW (ref 12.0–15.0)
MCH: 29.4 pg (ref 26.0–34.0)
MCHC: 31.9 g/dL (ref 30.0–36.0)
MCV: 92.1 fL (ref 80.0–100.0)
Platelets: 94 10*3/uL — ABNORMAL LOW (ref 150–400)
RBC: 2.52 MIL/uL — ABNORMAL LOW (ref 3.87–5.11)
RDW: 15.6 % — ABNORMAL HIGH (ref 11.5–15.5)
WBC: 9 10*3/uL (ref 4.0–10.5)
nRBC: 0.4 % — ABNORMAL HIGH (ref 0.0–0.2)

## 2020-06-20 LAB — RENAL FUNCTION PANEL
Albumin: 2.5 g/dL — ABNORMAL LOW (ref 3.5–5.0)
Anion gap: 12 (ref 5–15)
BUN: 106 mg/dL — ABNORMAL HIGH (ref 8–23)
CO2: 25 mmol/L (ref 22–32)
Calcium: 8.3 mg/dL — ABNORMAL LOW (ref 8.9–10.3)
Chloride: 100 mmol/L (ref 98–111)
Creatinine, Ser: 5.6 mg/dL — ABNORMAL HIGH (ref 0.44–1.00)
GFR calc Af Amer: 9 mL/min — ABNORMAL LOW (ref >60)
GFR calc non Af Amer: 8 mL/min — ABNORMAL LOW (ref >60)
Glucose, Bld: 138 mg/dL — ABNORMAL HIGH (ref 70–99)
Phosphorus: 5.7 mg/dL — ABNORMAL HIGH (ref 2.5–4.6)
Potassium: 4.5 mmol/L (ref 3.5–5.1)
Sodium: 137 mmol/L (ref 135–145)

## 2020-06-20 LAB — TROPONIN I (HIGH SENSITIVITY): Troponin I (High Sensitivity): 776 ng/L (ref <18)

## 2020-06-20 LAB — GLUCOSE, CAPILLARY
Glucose-Capillary: 140 mg/dL — ABNORMAL HIGH (ref 70–99)
Glucose-Capillary: 185 mg/dL — ABNORMAL HIGH (ref 70–99)
Glucose-Capillary: 160 mg/dL — ABNORMAL HIGH (ref 70–99)
Glucose-Capillary: 202 mg/dL — ABNORMAL HIGH (ref 70–99)

## 2020-06-20 MED ORDER — FUROSEMIDE 10 MG/ML IJ SOLN
80.0000 mg | Freq: Two times a day (BID) | INTRAMUSCULAR | Status: AC
  Administered 2020-06-20 (×2): 80 mg via INTRAVENOUS
  Filled 2020-06-20 (×2): qty 8

## 2020-06-20 MED ORDER — FUROSEMIDE 10 MG/ML IJ SOLN
80.0000 mg | Freq: Once | INTRAMUSCULAR | Status: AC
  Administered 2020-06-21: 80 mg via INTRAVENOUS
  Filled 2020-06-20: qty 8

## 2020-06-20 MED ORDER — CHLORHEXIDINE GLUCONATE CLOTH 2 % EX PADS
6.0000 | MEDICATED_PAD | Freq: Every day | CUTANEOUS | Status: DC
  Administered 2020-06-21 – 2020-06-30 (×9): 6 via TOPICAL

## 2020-06-20 MED ORDER — METOLAZONE 5 MG PO TABS
5.0000 mg | ORAL_TABLET | Freq: Once | ORAL | Status: AC
  Administered 2020-06-20: 5 mg via ORAL
  Filled 2020-06-20: qty 1

## 2020-06-20 NOTE — Progress Notes (Signed)
Labs back this AM, trop starting to trend down. Platelets are stable with slight uptrend to 95. Hgb has been trending down 8.7-->9.2-->7.7-->7.4. No overt signs of bleeding. I think platelets are reasonable for cath, Hgb trend needs to be followed further. SHe is due for dialysis catheter placement tomorrow. Given these issues would anticipate cath likely Tuesday as opposed to Monday, she is pain free currently and trop trending down. Defer anemia to primary team, with signs of ischemia would suggest transfusion to Hgb of 9   Zandra Abts MD

## 2020-06-20 NOTE — H&P (View-Only) (Signed)
Labs back this AM, trop starting to trend down. Platelets are stable with slight uptrend to 95. Hgb has been trending down 8.7-->9.2-->7.7-->7.4. No overt signs of bleeding. I think platelets are reasonable for cath, Hgb trend needs to be followed further. SHe is due for dialysis catheter placement tomorrow. Given these issues would anticipate cath likely Tuesday as opposed to Monday, she is pain free currently and trop trending down. Defer anemia to primary team, with signs of ischemia would suggest transfusion to Hgb of 9   Zandra Abts MD

## 2020-06-20 NOTE — Consult Note (Signed)
Chief Complaint: Patient was seen in consultation today for tunneled HD catheter placement.  Referring Physician(s): Dr. Royce Macadamia  Supervising Physician: Corrie Mckusick  Patient Status: [facility] - In-pt  History of Present Illness: Donna Howe is a 64 y.o. female with a past medical history significant for asthma, gout, hypothyroidism, CAD, right carotid artery stenosis, anemia, thrombocytopenia, HLD, NSTEMI, PAD, DM and CKD who presented to [facility] ED on 10/1 with complaints of weakness, fatigue, confusion, slurred speech and lip numbness - she had been discharged from [facility] the day prior after being admitted for NSTEMI. Initial workup significant for bradycardia, new RBBB on EKG, hyperkalemia (6.7), creatinine 5.18, BNP 2,129, troponin 482. She was given atropine, kayexalate, IV insulin, bicarb and calcium gluconate in the ED. She was then transferred to [facility] for initiation of hemodialysis. IR has been asked to place a tunneled HD catheter for dialysis access.  [name] denies any complaints except that she is confused about her diet restrictions because she has been told a few different things so far, however she has an appointment with a renal dietitian next month which should help clear up some confusion. She is reluctant to start HD mostly because she does not want to be obligated to be somewhere 3 days per week for the foreseeable future and she doesn't know of any dialysis places near her house in [place] except the [facility] which she refused to go to. Even though she is reluctant she knows that she needs dialysis and is ready to proceed with catheter placement tomorrow.  Past Medical History:  Diagnosis Date  . Anemia   . Asthma   . CAD (coronary artery disease)    Multivessel s/p CABG 2005, numerous PCIs since that time  . Carotid artery disease (HCC)    R CEA  . Chronic kidney disease (CKD), stage III (moderate) (HCC)   . Essential hypertension   . Gout   .  Hyperlipidemia   . Hypothyroidism   . Myocardial infarction ([place])   . PAD (peripheral artery disease) ([place])    Dr. Kellie Simmering  . S/P angioplasty with stent- DES to [facility] and to [facility] to LAD with DES 04/09/18.   04/10/2018  . SBO (small bowel obstruction) ([place]) 2011   Status post lysis of adhesions & hernia repair  . Sinus bradycardia   . Thrombocytopenia, unspecified ([place])   . Type 2 diabetes mellitus ([place])   . Umbilical hernia     Past Surgical History:  Procedure Laterality Date  . CESAREAN SECTION  1984  . CHOLECYSTECTOMY  2010  . CORONARY ARTERY BYPASS GRAFT  2005  . CORONARY BALLOON ANGIOPLASTY N/A 05/31/2017   Procedure: CORONARY BALLOON ANGIOPLASTY;  Surgeon: Jettie Booze, MD;  Location: [place] CV LAB;  Service: Cardiovascular;  Laterality: N/A;  . CORONARY STENT INTERVENTION N/A 05/31/2017   Procedure: CORONARY STENT INTERVENTION;  Surgeon: Jettie Booze, MD;  Location: [place] CV LAB;  Service: Cardiovascular;  Laterality: N/A;  . CORONARY STENT INTERVENTION N/A 04/09/2018   Procedure: CORONARY STENT INTERVENTION;  Surgeon: Jettie Booze, MD;  Location: [place] CV LAB;  Service: Cardiovascular;  Laterality: N/A;  SVG RCA  . ENDARTERECTOMY Right 04/18/2013   Procedure: ENDARTERECTOMY CAROTID;  Surgeon: Mal Misty, MD;  Location: [place];  Service: Vascular;  Laterality: Right;  . HERNIA REPAIR  1989  . Incisional hernia repair x2  03/04/2010   Laparoscopic with 35cm mesh by Dr Ronnald Collum  . LEFT HEART CATH AND  CORS/GRAFTS ANGIOGRAPHY N/A 05/31/2017   Procedure: LEFT HEART CATH AND CORS/GRAFTS ANGIOGRAPHY;  Surgeon: Jettie Booze, MD;  Location: [place] CV LAB;  Service: Cardiovascular;  Laterality: N/A;  . LEFT HEART CATH AND CORS/GRAFTS ANGIOGRAPHY N/A 04/08/2018   Procedure: LEFT HEART CATH AND CORS/GRAFTS ANGIOGRAPHY;  Surgeon: Jettie Booze, MD;  Location: [place] CV LAB;  Service: Cardiovascular;  Laterality: N/A;  . LEFT  HEART CATHETERIZATION WITH CORONARY ANGIOGRAM N/A 12/19/2012   Procedure: LEFT HEART CATHETERIZATION WITH CORONARY ANGIOGRAM;  Surgeon: Josue Hector, MD;  Location: [facility] CATH LAB;  Service: Cardiovascular;  Laterality: N/A;  . LEFT HEART CATHETERIZATION WITH CORONARY/GRAFT ANGIOGRAM N/A 04/19/2013   Procedure: LEFT HEART CATHETERIZATION WITH Beatrix Fetters;  Surgeon: Lorretta Harp, MD;  Location: [facility] CATH LAB;  Service: Cardiovascular;  Laterality: N/A;  . PATCH ANGIOPLASTY Right 04/18/2013   Procedure: PATCH ANGIOPLASTY Right Internal Carotid Artery;  Surgeon: Mal Misty, MD;  Location: [place];  Service: Vascular;  Laterality: Right;  . PERCUTANEOUS CORONARY STENT INTERVENTION (PCI-S) Right 12/19/2012   Procedure: PERCUTANEOUS CORONARY STENT INTERVENTION (PCI-S);  Surgeon: Josue Hector, MD;  Location: [facility] CATH LAB;  Service: Cardiovascular;  Laterality: Right;  . SHOULDER SURGERY      Allergies: Penicillins  Medications: Prior to Admission medications   Medication Sig Start Date End Date Taking? Authorizing Provider  Albuterol Sulfate 108 (90 Base) MCG/ACT AEPB Inhale 2 puffs into the lungs every 4 (four) hours as needed (Shortness of breath).    Yes [provider]  ALPRAZolam Duanne Moron) 0.5 MG tablet Take 0.25-0.5 mg by mouth See admin instructions. Take 0.25 mg tab by mouth every morning & evening as needed  and .05 mg tab at bedtime   Yes [provider]  aspirin EC 81 MG tablet Take 81 mg by mouth daily.    Yes [provider]  atorvastatin (LIPITOR) 80 MG tablet Take 1 tablet (80 mg total) by mouth every evening. 12/19/19  Yes Johnson, Clanford L, MD  benzonatate (TESSALON) 100 MG capsule Take 100 mg by mouth 3 (three) times daily as needed for cough.   Yes [provider]  clopidogrel (PLAVIX) 75 MG tablet Take 1 tablet (75 mg total) by mouth daily. 08/27/18  Yes Satira Sark, MD  ferrous sulfate 325 (65 FE) MG tablet Take 325 mg by mouth  daily with breakfast.   Yes [provider]  gabapentin (NEURONTIN) 400 MG capsule Take 1 capsule (400 mg total) by mouth 2 (two) times daily. 06/17/20  Yes Barton Dubois, MD  ipratropium (ATROVENT HFA) 17 MCG/ACT inhaler Inhale 2 puffs into the lungs See admin instructions. Inhale 2 puffs every 4 to 6 hrs as needed.   Yes [provider]  levothyroxine (SYNTHROID, LEVOTHROID) 125 MCG tablet Take 125 mcg by mouth daily.    Yes [provider]  NOVOLIN 70/30 RELION (70-30) 100 UNIT/ML injection Inject 24 Units into the skin 2 (two) times daily with a meal. Am & PM 08/28/15  Yes [provider]  ondansetron (ZOFRAN ODT) 8 MG disintegrating tablet Take 1 tablet (8 mg total) by mouth every 8 (eight) hours as needed for nausea or vomiting. 08/14/15  Yes Idol, Almyra Free, PA-C  pantoprazole (PROTONIX) 40 MG tablet Take 1 tablet (40 mg total) by mouth 2 (two) times daily. 06/17/20  Yes Barton Dubois, MD  ranolazine (RANEXA) 500 MG 12 hr tablet Take 1 tablet (500 mg total) by mouth 2 (two) times daily. 06/17/20  Yes  Barton Dubois, MD  sodium bicarbonate 650 MG tablet Take 650 mg by mouth 2 (two) times daily.   Yes [provider]  Vitamin D, Ergocalciferol, (DRISDOL) 1.25 MG (50000 UNIT) CAPS capsule Take 50,000 Units by mouth once a week. 12/31/19  Yes [provider]  metoprolol tartrate (LOPRESSOR) 25 MG tablet Take 0.5 tablets (12.5 mg total) by mouth 2 (two) times daily. 06/17/20   Barton Dubois, MD  nitroGLYCERIN (NITROSTAT) 0.4 MG SL tablet Place 0.4 mg under the tongue every 5 (five) minutes as needed for chest pain. Not to exceed 3 in 15 minute time frame    [provider]     Family History  Problem Relation Age of Onset  . Diabetes Mother   . Heart disease Mother        before age 12  . Hyperlipidemia Mother   . Hypertension Mother   . Thyroid disease Father   . Hypertension Father   . AAA (abdominal aortic aneurysm) Father   .  Heart disease Brother        before age 30  . Hypertension Brother   . Hyperlipidemia Son   . Hypertension Son     Social History   Socioeconomic History  . Marital status: Divorced    Spouse name: Not on file  . Number of children: Not on file  . Years of education: Not on file  . Highest education level: Not on file  Occupational History  . Occupation: Disabled  Tobacco Use  . Smoking status: Former Smoker    Packs/day: 1.00    Years: 20.00    Pack years: 20.00    Types: Cigarettes    Quit date: 12/10/2012    Years since quitting: 7.5  . Smokeless tobacco: Never Used  Vaping Use  . Vaping Use: Never used  Substance and Sexual Activity  . Alcohol use: No    Alcohol/week: 0.0 standard drinks  . Drug use: No  . Sexual activity: Not Currently  Other Topics Concern  . Not on file  Social History Narrative   Lives with mother.   Social Determinants of Health   Financial Resource Strain:   . Difficulty of Paying Living Expenses: Not on file  Food Insecurity:   . Worried About Charity fundraiser in the Last Year: Not on file  . Ran Out of Food in the Last Year: Not on file  Transportation Needs:   . Lack of Transportation (Medical): Not on file  . Lack of Transportation (Non-Medical): Not on file  Physical Activity:   . Days of Exercise per Week: Not on file  . Minutes of Exercise per Session: Not on file  Stress:   . Feeling of Stress : Not on file  Social Connections:   . Frequency of Communication with Friends and Family: Not on file  . Frequency of Social Gatherings with Friends and Family: Not on file  . Attends Religious Services: Not on file  . Active Member of Clubs or Organizations: Not on file  . Attends Archivist Meetings: Not on file  . Marital Status: Not on file     Review of Systems: A 12 point ROS discussed and pertinent positives are indicated in the HPI above.  All other systems are negative.  Review of Systems  Constitutional:  Negative for chills and fever.  Respiratory: Negative for cough and shortness of breath.   Cardiovascular: Negative for chest pain.  Gastrointestinal: Negative for abdominal pain, constipation, nausea  and vomiting.  Musculoskeletal: Negative for back pain.  Neurological: Negative for dizziness and headaches.    Vital Signs: BP (!) 130/47 (BP Location: Right Arm)   Pulse 75   Temp 98.1 F (36.7 C) (Oral)   Resp 18   Ht 5' 5.98" (1.676 m)   Wt 229 lb 8 oz (104.1 kg)   SpO2 94%   BMI 37.06 kg/m   Physical Exam Vitals and nursing note reviewed.  Constitutional:      General: She is not in acute distress. HENT:     Head: Normocephalic.     Mouth/Throat:     Mouth: Mucous membranes are moist.     Pharynx: Oropharynx is clear. No oropharyngeal exudate or posterior oropharyngeal erythema.  Cardiovascular:     Rate and Rhythm: Normal rate and regular rhythm.  Pulmonary:     Effort: Pulmonary effort is normal.     Breath sounds: Normal breath sounds.  Abdominal:     General: There is no distension.     Palpations: Abdomen is soft.     Tenderness: There is no abdominal tenderness.  Skin:    General: Skin is warm and dry.  Neurological:     Mental Status: She is alert and oriented to person, place, and time.  Psychiatric:        Mood and Affect: Mood normal.        Behavior: Behavior normal.        Thought Content: Thought content normal.        Judgment: Judgment normal.      MD Evaluation Airway: WNL Heart: WNL Abdomen: WNL Chest/ Lungs: WNL ASA  Classification: 3 Mallampati/Airway Score: Two   Imaging: DG Chest 1 View  Result Date: 06/18/2020 CLINICAL DATA:  Shortness of breath. Pt reports history of same during most recent admission. Hx diabetes, MI, HTN, CAD, asthma EXAM: CHEST  1 VIEW COMPARISON:  Chest x-ray 06/12/2020. FINDINGS: Stable cardiac enlargement that appears more prominent due to AP portable technique. Diffuse increase interstitial and airspace  opacities. Possible bilateral pleural effusions. No pneumothorax. No acute osseous abnormality. IMPRESSION: Pulmonary findings suggestive of pulmonary edema versus atypical/viral pneumonia. Suggestion of bilateral pleural effusions. Consider lateral view for further evaluation. Electronically Signed   By: Iven Finn M.D.   On: 06/18/2020 16:46   DG Chest 2 View  Result Date: 06/12/2020 CLINICAL DATA:  Chest pain and fatigue throughout the day. EXAM: CHEST - 2 VIEW COMPARISON:  06/11/2020 FINDINGS: Cardiac enlargement. No vascular congestion, edema, or consolidation. No pleural effusions. No pneumothorax. Mediastinal contours appear intact. Postoperative changes in the mediastinum. No significant changes since prior study. IMPRESSION: Cardiac enlargement.  No active pulmonary disease. Electronically Signed   By: Lucienne Capers M.D.   On: 06/12/2020 21:40   DG Abd 1 View  Result Date: 06/13/2020 CLINICAL DATA:  Abdominal bloating EXAM: ABDOMEN - 1 VIEW COMPARISON:  Radiograph 01/09/2010, chest radiograph 06/12/2020 FINDINGS: No high-grade obstructive bowel gas pattern is seen. There is a moderate colonic stool burden. About limited evaluation for free air on this supine only radiograph. No Rigler sign or other secondary features. Cholecystectomy clips in the right upper quadrant. Vascular calcium noted in the upper abdomen. Prior sternotomy and CABG as well as vascular stenting of the coronaries seen in the lung bases with some mild atelectatic change. No acute or suspicious osseous abnormalities. Degenerative changes in the lumbar spine, hips and pelvis. IMPRESSION: 1. No high-grade obstructive bowel gas pattern. 2. Moderate colonic stool burden. 3.  Prior cholecystectomy. 4. Bibasilar atelectasis. 5. Prior sternotomy and CABG. Electronically Signed   By: Lovena Le M.D.   On: 06/13/2020 02:30   CT Head Wo Contrast  Result Date: 06/18/2020 CLINICAL DATA:  Mental status change, unknown cause  general weakness, groggy EXAM: CT HEAD WITHOUT CONTRAST TECHNIQUE: Contiguous axial images were obtained from the base of the skull through the vertex without intravenous contrast. COMPARISON:  CT head 10/01/2018. FINDINGS: Brain: No evidence of acute infarction, hemorrhage, hydrocephalus, extra-axial collection or mass lesion/mass effect. Vascular: No hyperdense vessel or unexpected calcification. Skull: Negative for fracture or focal lesion. Sinuses/Orbits: No acute finding. Other: None. IMPRESSION: No acute intracranial abnormality. Electronically Signed   By: Iven Finn M.D.   On: 06/18/2020 16:37   NM Pulmonary Perfusion  Result Date: 06/16/2020 CLINICAL DATA:  Chest pain and hemoptysis EXAM: NUCLEAR MEDICINE PERFUSION LUNG SCAN TECHNIQUE: Perfusion images were obtained in multiple projections after intravenous injection of radiopharmaceutical. Views: Anterior, posterior, left lateral, right lateral, RPO, LPO, RAO, LAO RADIOPHARMACEUTICALS:  5.0 mCi Tc-33m MAA IV COMPARISON:  Chest radiograph June 12, 2020 FINDINGS: Radiotracer uptake is homogeneous and symmetric bilaterally. No perfusion defects evident. Heart noted to be prominent. IMPRESSION: No perfusion defects. No findings suggesting pulmonary embolus. Cardiac prominence noted. Electronically Signed   By: Lowella Grip III M.D.   On: 06/16/2020 13:09   US Venous Img Lower Bilateral (DVT)  Result Date: 06/16/2020 CLINICAL DATA:  Chest pain and hemoptysis EXAM: BILATERAL LOWER EXTREMITY VENOUS DOPPLER ULTRASOUND TECHNIQUE: Gray-scale sonography with graded compression, as well as color Doppler and duplex ultrasound were performed to evaluate the lower extremity deep venous systems from the level of the common femoral vein and including the common femoral, femoral, profunda femoral, popliteal and calf veins including the posterior tibial, peroneal and gastrocnemius veins when visible. The superficial great saphenous vein was also  interrogated. Spectral Doppler was utilized to evaluate flow at rest and with distal augmentation maneuvers in the common femoral, femoral and popliteal veins. COMPARISON:  None. FINDINGS: RIGHT LOWER EXTREMITY Common Femoral Vein: No evidence of thrombus. Normal compressibility, respiratory phasicity and response to augmentation. Saphenofemoral Junction: No evidence of thrombus. Normal compressibility and flow on color Doppler imaging. Profunda Femoral Vein: No evidence of thrombus. Normal compressibility and flow on color Doppler imaging. Femoral Vein: No evidence of thrombus. Normal compressibility, respiratory phasicity and response to augmentation. Popliteal Vein: No evidence of thrombus. Normal compressibility, respiratory phasicity and response to augmentation. Calf Veins: No evidence of thrombus. Normal compressibility and flow on color Doppler imaging. Superficial Great Saphenous Vein: No evidence of thrombus. Normal compressibility. Venous Reflux:  None. Other Findings:  Calf edema is noted. LEFT LOWER EXTREMITY Common Femoral Vein: No evidence of thrombus. Normal compressibility, respiratory phasicity and response to augmentation. Saphenofemoral Junction: No evidence of thrombus. Normal compressibility and flow on color Doppler imaging. Profunda Femoral Vein: No evidence of thrombus. Normal compressibility and flow on color Doppler imaging. Femoral Vein: No evidence of thrombus. Normal compressibility, respiratory phasicity and response to augmentation. Popliteal Vein: No evidence of thrombus. Normal compressibility, respiratory phasicity and response to augmentation. Calf Veins: No evidence of thrombus. Normal compressibility and flow on color Doppler imaging. Superficial Great Saphenous Vein: No evidence of thrombus. Normal compressibility. Venous Reflux:  None. Other Findings:  Calf edema is noted. IMPRESSION: No evidence of deep venous thrombosis in either lower extremity. Electronically Signed   By:  Inez Catalina M.D.   On: 06/16/2020 14:45   ECHOCARDIOGRAM COMPLETE  Result Date:  06/13/2020    ECHOCARDIOGRAM REPORT   Patient Name:   MALAINA MORTELLARO Date of Exam: 06/13/2020 Medical Rec #:  [id number]        Height:       66.0 in Accession #:    [id number]       Weight:       218.0 lb Date of Birth:  25-Mar-1956       BSA:          2.074 m Patient Age:    21 years         BP:           136/61 mmHg Patient Gender: F                HR:           65 bpm. Exam Location:  [facility] Procedure: 2D Echo, Cardiac Doppler and Color Doppler Indications:    Chest pain  History:        Patient has prior history of Echocardiogram examinations, most                 recent 10/02/2019. CAD and Previous Myocardial Infarction, Prior                 CABG, PAD, Signs/Symptoms:Chest Pain; Risk Factors:Diabetes,                 Hypertension, Dyslipidemia and Current Smoker. CKD.  Sonographer:    Dustin Flock RDCS Referring Phys: [id number] ASIA B [place]  Sonographer Comments: Patient is morbidly obese. IMPRESSIONS  1. Left ventricular ejection fraction, by estimation, is 55 to 60%. The left ventricle has normal function. The left ventricle has no regional wall motion abnormalities. There is mild concentric left ventricular hypertrophy. Left ventricular diastolic parameters are consistent with Grade II diastolic dysfunction (pseudonormalization). Elevated left ventricular end-diastolic pressure.  2. Right ventricular systolic function is normal. The right ventricular size is normal. There is normal pulmonary artery systolic pressure.  3. Left atrial size was mildly dilated.  4. Right atrial size was mildly dilated.  5. The mitral valve is normal in structure. Trivial mitral valve regurgitation. No evidence of mitral stenosis.  6. The aortic valve is tricuspid. Aortic valve regurgitation is not visualized. No aortic stenosis is present.  7. The inferior vena cava is normal in size with greater than 50% respiratory variability,  suggesting right atrial pressure of 3 mmHg. FINDINGS  Left Ventricle: Left ventricular ejection fraction, by estimation, is 55 to 60%. The left ventricle has normal function. The left ventricle has no regional wall motion abnormalities. The left ventricular internal cavity size was normal in size. There is  mild concentric left ventricular hypertrophy. Left ventricular diastolic parameters are consistent with Grade II diastolic dysfunction (pseudonormalization). Elevated left ventricular end-diastolic pressure. Right Ventricle: The right ventricular size is normal. No increase in right ventricular wall thickness. Right ventricular systolic function is normal. There is normal pulmonary artery systolic pressure. The tricuspid regurgitant velocity is 2.31 m/s, and  with an assumed right atrial pressure of 3 mmHg, the estimated right ventricular systolic pressure is 41.6 mmHg. Left Atrium: Left atrial size was mildly dilated. Right Atrium: Right atrial size was mildly dilated. Pericardium: There is no evidence of pericardial effusion. Mitral Valve: The mitral valve is normal in structure. Trivial mitral valve regurgitation. No evidence of mitral valve stenosis. Tricuspid Valve: The tricuspid valve is normal in structure. Tricuspid valve regurgitation is trivial. No evidence of tricuspid stenosis. Aortic Valve:  The aortic valve is tricuspid. Aortic valve regurgitation is not visualized. No aortic stenosis is present. Pulmonic Valve: The pulmonic valve was normal in structure. Pulmonic valve regurgitation is not visualized. No evidence of pulmonic stenosis. Aorta: The aortic root is normal in size and structure. Venous: The inferior vena cava is normal in size with greater than 50% respiratory variability, suggesting right atrial pressure of 3 mmHg. IAS/Shunts: No atrial level shunt detected by color flow Doppler.  LEFT VENTRICLE PLAX 2D LVIDd:         5.18 cm  Diastology LVIDs:         3.64 cm  LV e' medial:    6.09 cm/s  LV PW:         1.31 cm  LV E/e' medial:  19.5 LV IVS:        1.31 cm  LV e' lateral:   7.29 cm/s LVOT diam:     1.80 cm  LV E/e' lateral: 16.3 LV SV:         79 LV SV Index:   38 LVOT Area:     2.54 cm  RIGHT VENTRICLE RV Basal diam:  3.50 cm RV S prime:     7.72 cm/s TAPSE (M-mode): 2.5 cm LEFT ATRIUM             Index       RIGHT ATRIUM           Index LA diam:        3.70 cm 1.78 cm/m  RA Area:     19.70 cm LA Vol (A2C):   29.1 ml 14.03 ml/m RA Volume:   62.30 ml  30.03 ml/m LA Vol (A4C):   67.1 ml 32.35 ml/m LA Biplane Vol: 48.3 ml 23.28 ml/m  AORTIC VALVE LVOT Vmax:   129.00 cm/s LVOT Vmean:  92.200 cm/s LVOT VTI:    0.312 m  AORTA Ao Root diam: 3.30 cm MITRAL VALVE                TRICUSPID VALVE MV Area (PHT): 2.71 cm     TR Peak grad:   21.3 mmHg MV Decel Time: 280 msec     TR Vmax:        231.00 cm/s MV E velocity: 119.00 cm/s MV A velocity: 104.00 cm/s  SHUNTS MV E/A ratio:  1.14         Systemic VTI:  0.31 m                             Systemic Diam: 1.80 cm Skeet Latch md Electronically signed by Skeet Latch md Signature Date/Time: 06/13/2020/12:43:41 PM    Final    ECHOCARDIOGRAM LIMITED  Result Date: 06/19/2020    ECHOCARDIOGRAM LIMITED REPORT   Patient Name:   METTIE ROYLANCE Date of Exam: 06/19/2020 Medical Rec #:  [id number]        Height:       66.0 in Accession #:    [id number]       Weight:       233.9 lb Date of Birth:  Nov 14, 1955       BSA:          2.137 m Patient Age:    28 years         BP:           136/52 mmHg Patient Gender: F  HR:           85 bpm. Exam Location:  Inpatient Procedure: Color Doppler, Limited Echo and Intracardiac Opacification Agent Indications:    Chest pain  History:        Patient has prior history of Echocardiogram examinations, most                 recent 06/13/2020. CAD and Previous Myocardial Infarction, Prior                 CABG; Risk Factors:Hypertension, Diabetes, Dyslipidemia and                 Current Smoker. CKD.   Sonographer:    Clayton Lefort RDCS (AE) Referring Phys: [id number] Arnoldo Lenis  Sonographer Comments: Patient is morbidly obese. Image acquisition challenging due to patient body habitus. IMPRESSIONS  1. Left ventricular ejection fraction, by estimation, is 55 to 60%. The left ventricle has normal function. The left ventricle has no regional wall motion abnormalities.  2. The mitral valve is normal in structure. Mild to moderate mitral valve regurgitation. No evidence of mitral stenosis.  3. Limited echocardiogram to reevalaute LV function and wall motion. FINDINGS  Left Ventricle: Left ventricular ejection fraction, by estimation, is 55 to 60%. The left ventricle has normal function. The left ventricle has no regional wall motion abnormalities. Definity contrast agent was given IV to delineate the left ventricular  endocardial borders. Mitral Valve: The mitral valve is normal in structure. Mild to moderate mitral valve regurgitation. No evidence of mitral valve stenosis. LEFT VENTRICLE PLAX 2D LVIDd:         5.10 cm LVIDs:         3.40 cm LV PW:         1.50 cm LV IVS:        1.50 cm LVOT diam:     1.70 cm LVOT Area:     2.27 cm  LEFT ATRIUM         Index LA diam:    3.90 cm 1.82 cm/m   AORTA Ao Root diam: 3.30 cm  SHUNTS Systemic Diam: 1.70 cm Carlyle Dolly MD Electronically signed by Carlyle Dolly MD Signature Date/Time: 06/19/2020/3:14:35 PM    Final     Labs:  CBC: Recent Labs    06/17/20 0551 06/18/20 1543 06/19/20 0236 06/20/20 0929  WBC 9.7 11.5* 13.3* 9.0  HGB 8.7* 9.2* 7.7* 7.4*  HCT 26.6* 27.5* 24.0* 23.2*  PLT 101* 88* 90* 94*    COAGS: Recent Labs    10/02/19 0607 11/26/19 0700  INR 1.0 1.0    BMP: Recent Labs    06/18/20 1726 06/18/20 1726 06/18/20 2135 06/19/20 0236 06/19/20 1038 06/20/20 0251  NA 133*  --  135 135  --  137  K 6.7*   < > 6.5* 5.7* 4.7 4.5  CL 102  --  103 100  --  100  CO2 19*  --  21* 21*  --  25  GLUCOSE 304*  --  290* 220*  --  138*  BUN  110*  --  109* 110*  --  106*  CALCIUM 8.5*  --  8.8* 8.8*  --  8.3*  CREATININE 5.13*  --  5.57* 5.68*  --  5.60*  GFRNONAA 8*  --  8* 7*  --  8*  GFRAA 10*  --  9* 9*  --  9*   < > = values in this interval not displayed.  LIVER FUNCTION TESTS: Recent Labs    10/01/19 2056 10/01/19 2056 10/02/19 0425 10/02/19 0425 12/17/19 1329 12/19/19 0449 06/16/20 0604 06/17/20 0551 06/19/20 0236 06/20/20 0251  BILITOT 0.4  --  0.3  --  0.6  --   --   --  1.2  --   AST 24  --  22  --  18  --   --   --  129*  --   ALT 32  --  29  --  26  --   --   --  211*  --   ALKPHOS 170*  --  141*  --  140*  --   --   --  104  --   PROT 7.2  --  6.3*  --  6.3*  --   --   --  5.5*  --   ALBUMIN 3.5   < > 3.1*   < > 2.8*   < > 2.9* 2.9* 2.8* 2.5*   < > = values in this interval not displayed.    TUMOR MARKERS: No results for input(s): AFPTM, CEA, CA199, CHROMGRNA in the last 8760 hours.  Assessment and Plan:  64 y/o F with history of CKD V now with progression to ESRD requiring HD - IR has been asked to place a tunneled HD catheter for durable venous access.  Plan for tunneled HD catheter placement tomorrow (10/4) in IR - patient to be NPO at midnight, IR will call for patient when ready.  Risks and benefits discussed with the patient including, but not limited to bleeding, infection, vascular injury, pneumothorax which may require chest tube placement, air embolism or even death.  All of the patient's questions were answered, patient is agreeable to proceed.  Consent signed and in chart.  Thank you for this interesting consult.  I greatly enjoyed meeting Reniyah Gootee Gillum and look forward to participating in their care.  A copy of this report was sent to the requesting provider on this date.  Electronically Signed: Joaquim Nam, PA-C 06/20/2020, 10:03 AM   I spent a total of 20 Minutes  in face to face in clinical consultation, greater than 50% of which was counseling/coordinating  care for tunneled HD catheter placement.

## 2020-06-20 NOTE — Progress Notes (Signed)
Progress Note  Patient Name: Donna Howe Date of Encounter: 06/20/2020  [facility] Cardiologist: Rozann Lesches, MD   Subjective   Chest pain resolved with IV NG  Inpatient Medications    Scheduled Meds: . ALPRAZolam  0.25 mg Oral BID  . ALPRAZolam  0.5 mg Oral QHS  . aspirin EC  81 mg Oral Daily  . atorvastatin  80 mg Oral QPM  . Chlorhexidine Gluconate Cloth  6 each Topical Q0600  . clopidogrel  75 mg Oral Daily  . ferrous sulfate  325 mg Oral Q lunch  . furosemide  80 mg Intravenous Q12H  . [START ON 06/21/2020] furosemide  80 mg Intravenous Once  . heparin  5,000 Units Subcutaneous Q8H  . insulin aspart  0-5 Units Subcutaneous QHS  . insulin aspart  0-9 Units Subcutaneous TID WC  . insulin glargine  10 Units Subcutaneous Daily  . levothyroxine  125 mcg Oral Daily  . metolazone  5 mg Oral Once  . pantoprazole  40 mg Oral BID  . sodium bicarbonate  650 mg Oral BID  . [START ON 06/21/2020] Vitamin D (Ergocalciferol)  50,000 Units Oral Q Mon   Continuous Infusions: . calcium chloride  IV    . nitroGLYCERIN 5 mcg/min (06/19/20 0936)   PRN Meds: acetaminophen **OR** acetaminophen, albuterol, morphine injection, nitroGLYCERIN, ondansetron (ZOFRAN) IV   Vital Signs    Vitals:   06/19/20 1934 06/19/20 2303 06/20/20 0001 06/20/20 0350  BP: 136/72  (!) 125/44 (!) 111/51  Pulse:    68  Resp: 18  18 19   Temp: 97.8 F (36.6 C)  98.4 F (36.9 C) 97.8 F (36.6 C)  TempSrc: Oral  Oral Oral  SpO2:  97% 100% 95%  Weight:    104.1 kg  Height:        Intake/Output Summary (Last 24 hours) at 06/20/2020 0751 Last data filed at 06/20/2020 0350 Gross per 24 hour  Intake 220 ml  Output 2625 ml  Net -2405 ml   Last 3 Weights 06/20/2020 06/19/2020 06/18/2020  Weight (lbs) 229 lb 8 oz 233 lb 14.5 oz 234 lb 12.6 oz  Weight (kg) 104.1 kg 106.1 kg 106.5 kg      Telemetry    SR - Personally Reviewed  ECG    n/a- Personally Reviewed  Physical Exam   GEN: No  acute distress.   Neck: No JVD Cardiac: RRR, no murmurs, rubs, or gallops.  Respiratory: bilateral crackles GI: Soft, nontender, non-distended  MS:1+ bilateral LE edema; No deformity. Neuro:  Nonfocal  Psych: Normal affect   Labs    High Sensitivity Troponin:   Recent Labs  Lab 06/13/20 1452 06/13/20 1642 06/18/20 1543 06/18/20 2135 06/19/20 1038  TROPONINIHS 160* 141* 482* 955* 978*      Chemistry Recent Labs  Lab 06/17/20 0551 06/18/20 1543 06/18/20 2135 06/18/20 2135 06/19/20 0236 06/19/20 1038 06/20/20 0251  NA 137   < > 135  --  135  --  137  K 5.3*   < > 6.5*   < > 5.7* 4.7 4.5  CL 106   < > 103  --  100  --  100  CO2 24   < > 21*  --  21*  --  25  GLUCOSE 118*   < > 290*  --  220*  --  138*  BUN 91*   < > 109*  --  110*  --  106*  CREATININE 4.43*   < > 5.57*  --  5.68*  --  5.60*  CALCIUM 8.4*   < > 8.8*  --  8.8*  --  8.3*  PROT  --   --   --   --  5.5*  --   --   ALBUMIN 2.9*  --   --   --  2.8*  --  2.5*  AST  --   --   --   --  129*  --   --   ALT  --   --   --   --  211*  --   --   ALKPHOS  --   --   --   --  104  --   --   BILITOT  --   --   --   --  1.2  --   --   GFRNONAA 10*   < > 8*  --  7*  --  8*  GFRAA 12*   < > 9*  --  9*  --  9*  ANIONGAP 7   < > 11  --  14  --  12   < > = values in this interval not displayed.     Hematology Recent Labs  Lab 06/17/20 0551 06/18/20 1543 06/19/20 0236  WBC 9.7 11.5* 13.3*  RBC 2.88* 3.00* 2.64*  HGB 8.7* 9.2* 7.7*  HCT 26.6* 27.5* 24.0*  MCV 92.4 91.7 90.9  MCH 30.2 30.7 29.2  MCHC 32.7 33.5 32.1  RDW 15.0 14.9 15.1  PLT 101* 88* 90*    BNP Recent Labs  Lab 06/18/20 1549  BNP 2,129.0*     DDimer No results for input(s): DDIMER in the last 168 hours.   Radiology    DG Chest 1 View  Result Date: 06/18/2020 CLINICAL DATA:  Shortness of breath. Pt reports history of same during most recent admission. Hx diabetes, MI, HTN, CAD, asthma EXAM: CHEST  1 VIEW COMPARISON:  Chest x-ray  06/12/2020. FINDINGS: Stable cardiac enlargement that appears more prominent due to AP portable technique. Diffuse increase interstitial and airspace opacities. Possible bilateral pleural effusions. No pneumothorax. No acute osseous abnormality. IMPRESSION: Pulmonary findings suggestive of pulmonary edema versus atypical/viral pneumonia. Suggestion of bilateral pleural effusions. Consider lateral view for further evaluation. Electronically Signed   By: Iven Finn M.D.   On: 06/18/2020 16:46   CT Head Wo Contrast  Result Date: 06/18/2020 CLINICAL DATA:  Mental status change, unknown cause general weakness, groggy EXAM: CT HEAD WITHOUT CONTRAST TECHNIQUE: Contiguous axial images were obtained from the base of the skull through the vertex without intravenous contrast. COMPARISON:  CT head 10/01/2018. FINDINGS: Brain: No evidence of acute infarction, hemorrhage, hydrocephalus, extra-axial collection or mass lesion/mass effect. Vascular: No hyperdense vessel or unexpected calcification. Skull: Negative for fracture or focal lesion. Sinuses/Orbits: No acute finding. Other: None. IMPRESSION: No acute intracranial abnormality. Electronically Signed   By: Iven Finn M.D.   On: 06/18/2020 16:37   ECHOCARDIOGRAM LIMITED  Result Date: 06/19/2020    ECHOCARDIOGRAM LIMITED REPORT   Patient Name:   Donna Howe Date of Exam: 06/19/2020 Medical Rec #:  [id number]        Height:       66.0 in Accession #:    [id number]       Weight:       233.9 lb Date of Birth:  [birth year]       BSA:          2.137 m Patient Age:  63 years         BP:           136/52 mmHg Patient Gender: F                HR:           85 bpm. Exam Location:  Inpatient Procedure: Color Doppler, Limited Echo and Intracardiac Opacification Agent Indications:    Chest pain  History:        Patient has prior history of Echocardiogram examinations, most                 recent 06/13/2020. CAD and Previous Myocardial Infarction, Prior                  CABG; Risk Factors:Hypertension, Diabetes, Dyslipidemia and                 Current Smoker. CKD.  Sonographer:    Clayton Lefort RDCS (AE) Referring Phys: [id number] Arnoldo Lenis  Sonographer Comments: Patient is morbidly obese. Image acquisition challenging due to patient body habitus. IMPRESSIONS  1. Left ventricular ejection fraction, by estimation, is 55 to 60%. The left ventricle has normal function. The left ventricle has no regional wall motion abnormalities.  2. The mitral valve is normal in structure. Mild to moderate mitral valve regurgitation. No evidence of mitral stenosis.  3. Limited echocardiogram to reevalaute LV function and wall motion. FINDINGS  Left Ventricle: Left ventricular ejection fraction, by estimation, is 55 to 60%. The left ventricle has normal function. The left ventricle has no regional wall motion abnormalities. Definity contrast agent was given IV to delineate the left ventricular  endocardial borders. Mitral Valve: The mitral valve is normal in structure. Mild to moderate mitral valve regurgitation. No evidence of mitral valve stenosis. LEFT VENTRICLE PLAX 2D LVIDd:         5.10 cm LVIDs:         3.40 cm LV PW:         1.50 cm LV IVS:        1.50 cm LVOT diam:     1.70 cm LVOT Area:     2.27 cm  LEFT ATRIUM         Index LA diam:    3.90 cm 1.82 cm/m   AORTA Ao Root diam: 3.30 cm  SHUNTS Systemic Diam: 1.70 cm Carlyle Dolly MD Electronically signed by Carlyle Dolly MD Signature Date/Time: 06/19/2020/3:14:35 PM    Final     Cardiac Studies     Patient Profile     Ms. Jarosz 64 yo female history of CAD, CABG in 2005. stent SVG to RCA and Cutting Balloon to the proximal circumflex 05/2017, PTCA to the proximal circumflex stage DES to the SVG to RCA and DES LIMA to the LAD 03/2018. History of DM2, HL, HTN, CKD V admitted with genralized fatigue, weakness.  In ER found to be bradycardic to 45, K 6.7, Cr up to 5.18, BNP 2129. Admitted with plans to start HD. During admit  episodes of chest pains, cardiology consulted.   Assessment & Plan    1. NSTEMI - - complex history of CAD as reported above - admit last week to [facility] with NSTEMI, managed medically due to advanced kidney disease  - - admitted again to [facility] yesterday with hyperkalemia, worsening renal function, fatigue. During admission recurrent chest pains.  - - trop trending back up, EKG chronic ST/T changes - chest pain resolved on NG  drip - followed by renal, HD is imminent, ok with with cath.  - Continue ASA, plavix (has been on at home), atorva 80, nitro gtt. Bradycardia on lopressor, discontinued. Borderline low platelets and uremia, we did not start hep gtt yesterday - chest pain resolved on NG gtt.   - plan for cath likely Monday pending platelets, from nephrology standpoint they are ok with cath anytime.   2. Acute on chronic diastolic HF - significant volume overload in setting of renal failure - repeat echo yesterday showed that LVEF remains normal 60-65%, she does have grade II diastolic dysfunction - fluid management per renal, plans are for HD. Is making urine with diuretics at this time.    3. CKD V - chronic kidney disease with significant recent worsening - plans for IR catheter placement 10/4 to start HD. Hold on fistula surgery until more stable from cardiac standpoint     For questions or updates, please contact [place] Please consult [email or web address] for contact info under        Signed, Carlyle Dolly, MD  06/20/2020, 7:51 AM

## 2020-06-20 NOTE — Progress Notes (Signed)
[facility] Progress Note  Name: [name] MRN: [id number] DOB: December 04, 1955  Chief Complaint:  fatigue  Subjective:  IR consulted on 10/2 and can place catheter on 10/4 for HD start.  She had 2.6 liters UOP over 10/3.  We discussed plans for tunneled catheter with IR on 10/4 with HD afterward.  Discussed risks/benefits/indications for PRBC's and she consents to blood if is needed.  Had some left arm pain when she laid flat for a test.   Review of systems:  States that her breathing is ok at rest - she's not really tried to move around much  Chest pain improved on the nitro - some yesterday but none today  Nausea and doesn't have good appetite  -------- Background on consult:  Donna Howe is a 64 y.o. female with a history including coronary artery disease, advanced CKD (biopsy-proven DM CKD) hypertension and type 2 diabetes mellitus who presented to the hospital at [facility] on 10/1 - she states that she was tired and slurring her words and her family called her and was worried.  Note that she had just recently been discharged from admission with NSTEMI on 9/30.  She reported fatigue and was found to have marked hyperkalemia with a K of 6.7.  Overnight early this morning around 5 she developed chest pain and cardiology has been consulted; she also had left arm pain and some nausea.  They are starting a nitro gtt.  She states that she has had chest pain like this off and on for about a week with her recent event.  She was seen by nephrology at her recent hospitalization and had been planning for access placement for dialysis initiation at some point soon however states she missed a pre-op appointment appointment due to her hospitalization and she states she is rescheduled for a couple of months from now.  Cardiology is planning on a cath next week unless needed emergently and at this time would recommend deferring permanent access placement until the cath.  Note that she  was admitted within the last week to [facility] with an NSTEMI and managed medically at that time due to advanced kidney disease.  She had a GFR of 15 earlier this year in 12/2019.  She had 1.3 liters UOP over 10/1 charted.  Got lasix 80 mg IV overnight x 2.  She has been on 4 liters oxygen here and not on any at home.  Chest pain is 0/10 as of my exam on the nitro.  We discussed risks/benefits/indications for RRT and she consents to dialysis.  She thinks hemo would work best for her - worried about doing things right and had a prior abd hernia s/p repair.  She states right leg always more swollen than left since CABG and legs actually look better than normal now because she's been off of her feet.     Intake/Output Summary (Last 24 hours) at 06/20/2020 0722 Last data filed at 06/20/2020 0350 Gross per 24 hour  Intake 220 ml  Output 2625 ml  Net -2405 ml    Vitals:  Vitals:   06/19/20 1934 06/19/20 2303 06/20/20 0001 06/20/20 0350  BP: 136/72  (!) 125/44 (!) 111/51  Pulse:    68  Resp: 18  18 19   Temp: 97.8 F (36.6 C)  98.4 F (36.9 C) 97.8 F (36.6 C)  TempSrc: Oral  Oral Oral  SpO2:  97% 100% 95%  Weight:    104.1 kg  Height:  Physical Exam:  General: adult female in bed in NAD at rest HEENT: NCAT Eyes: EOMI sclera anicteric Neck: supple trachea midline Heart: S1S2 no rub Lungs: clear but reduced; rare basilar crackles; unlabored at rest; on 3 liters oxygen  Abdomen: softly dist/obese habitus/nontender Extremities: right leg 1-2+ edema and left leg with trace to 1+ edema Skin: no rash on extremities exposed Psych normal mood and affect Neuro: alert and oriented x 3; provides hx and follows commands  Medications reviewed   Labs:  BMP Latest Ref Rng & Units 06/20/2020 06/19/2020 06/19/2020  Glucose 70 - 99 mg/dL 138(H) - 220(H)  BUN 8 - 23 mg/dL 106(H) - 110(H)  Creatinine 0.44 - 1.00 mg/dL 5.60(H) - 5.68(H)  Sodium 135 - 145 mmol/L 137 - 135  Potassium 3.5 - 5.1  mmol/L 4.5 4.7 5.7(H)  Chloride 98 - 111 mmol/L 100 - 100  CO2 22 - 32 mmol/L 25 - 21(L)  Calcium 8.9 - 10.3 mg/dL 8.3(L) - 8.8(L)     Assessment/Plan:   # NSTEMI  - Discussed with cardiology.  Timing of heart catheterization per cardiology discretion - ok at any time from a renal standpoint - note cardiology has stopped beta blocker due to bradycardia  # CKD stage V with progression to end-stage renal disease - Consulted IR for tunneled dialysis catheter placement for initiation of hemodialysis (plan for procedure on 10/4) - HD on 10/4 after tunneled catheter  - Made her NPO after midnight for tunneled catheter on 10/4 - hold on permanent access in setting of NSTEMI and would proceed after her heart cath if ok per cardiology - She will need CLIP to outpatient HD unit   # Hyperkalemia - acute worsening worrisome with setting of NSTEMI - Currently improving with medical management; note setting of hx NSTEMI currently medically managed - for HD as above  # Acute on chronic diastolic CHF  - repeat lasix 80 mg IV BID today and metolazone 5 mg PO once this AM - lasix tomorrow AM as well   # HTN  - nitro gtt for chest pain per cardiology and beta blocker was stopped for bradycardia   # Anemia of CKD  - Defer ESA currently in setting of NSTEMI - Transfuse as needed - anticipate need soon  - update CBC  Claudia Desanctis, MD 06/20/2020 7:48 AM

## 2020-06-20 NOTE — Progress Notes (Signed)
Report given to [place], Therapist, sports. Patient is alert and oriented x4 resting in bed with call light in reach. VSS. 3L of O2 in place via nasal cannula. Great urine output overnight. 1280mL fluid restriction in place. Nitro gtt infusing per cards and no chest pain noted overnight.  Plan is to have tunneled cath placed 10/4 for dialysis, first run to be after access placed. Heart cath may take place per kidney function after dialysis.  K+ 4.5, Creat 5.60 this AM.

## 2020-06-21 ENCOUNTER — Other Ambulatory Visit: Payer: Self-pay

## 2020-06-21 ENCOUNTER — Inpatient Hospital Stay (HOSPITAL_COMMUNITY): Payer: Medicare HMO

## 2020-06-21 DIAGNOSIS — I214 Non-ST elevation (NSTEMI) myocardial infarction: Secondary | ICD-10-CM

## 2020-06-21 DIAGNOSIS — I25110 Atherosclerotic heart disease of native coronary artery with unstable angina pectoris: Secondary | ICD-10-CM

## 2020-06-21 DIAGNOSIS — R001 Bradycardia, unspecified: Secondary | ICD-10-CM

## 2020-06-21 DIAGNOSIS — N186 End stage renal disease: Secondary | ICD-10-CM

## 2020-06-21 HISTORY — PX: IR US GUIDE VASC ACCESS RIGHT: IMG2390

## 2020-06-21 HISTORY — PX: IR FLUORO GUIDE CV LINE RIGHT: IMG2283

## 2020-06-21 LAB — BASIC METABOLIC PANEL
Anion gap: 15 (ref 5–15)
BUN: 103 mg/dL — ABNORMAL HIGH (ref 8–23)
CO2: 27 mmol/L (ref 22–32)
Calcium: 8.1 mg/dL — ABNORMAL LOW (ref 8.9–10.3)
Chloride: 96 mmol/L — ABNORMAL LOW (ref 98–111)
Creatinine, Ser: 5.72 mg/dL — ABNORMAL HIGH (ref 0.44–1.00)
GFR calc Af Amer: 9 mL/min — ABNORMAL LOW (ref >60)
GFR calc non Af Amer: 7 mL/min — ABNORMAL LOW (ref >60)
Glucose, Bld: 221 mg/dL — ABNORMAL HIGH (ref 70–99)
Potassium: 3.4 mmol/L — ABNORMAL LOW (ref 3.5–5.1)
Sodium: 138 mmol/L (ref 135–145)

## 2020-06-21 LAB — CBC
HCT: 23.9 % — ABNORMAL LOW (ref 36.0–46.0)
Hemoglobin: 7.8 g/dL — ABNORMAL LOW (ref 12.0–15.0)
MCH: 30.1 pg (ref 26.0–34.0)
MCHC: 32.6 g/dL (ref 30.0–36.0)
MCV: 92.3 fL (ref 80.0–100.0)
Platelets: 106 10*3/uL — ABNORMAL LOW (ref 150–400)
RBC: 2.59 MIL/uL — ABNORMAL LOW (ref 3.87–5.11)
RDW: 15.3 % (ref 11.5–15.5)
WBC: 8 10*3/uL (ref 4.0–10.5)
nRBC: 0.2 % (ref 0.0–0.2)

## 2020-06-21 LAB — GLUCOSE, CAPILLARY
Glucose-Capillary: 209 mg/dL — ABNORMAL HIGH (ref 70–99)
Glucose-Capillary: 166 mg/dL — ABNORMAL HIGH (ref 70–99)
Glucose-Capillary: 189 mg/dL — ABNORMAL HIGH (ref 70–99)
Glucose-Capillary: 193 mg/dL — ABNORMAL HIGH (ref 70–99)

## 2020-06-21 MED ORDER — ASPIRIN 81 MG PO CHEW
81.0000 mg | CHEWABLE_TABLET | ORAL | Status: AC
  Administered 2020-06-22: 81 mg via ORAL
  Filled 2020-06-21: qty 1

## 2020-06-21 MED ORDER — SODIUM CHLORIDE 0.9 % IV SOLN: 250.0000 mL | INTRAVENOUS | Status: DC | PRN

## 2020-06-21 MED ORDER — FENTANYL CITRATE (PF) 100 MCG/2ML IJ SOLN
INTRAMUSCULAR | Status: AC
  Filled 2020-06-21: qty 2

## 2020-06-21 MED ORDER — HEPARIN SODIUM (PORCINE) 1000 UNIT/ML IJ SOLN
INTRAMUSCULAR | Status: AC
  Filled 2020-06-21: qty 1

## 2020-06-21 MED ORDER — SODIUM CHLORIDE 0.9% FLUSH: 3.0000 mL | INTRAVENOUS | Status: DC | PRN

## 2020-06-21 MED ORDER — MIDAZOLAM HCL 2 MG/2ML IJ SOLN
INTRAMUSCULAR | Status: AC | PRN
  Administered 2020-06-21: 0.5 mg via INTRAVENOUS
  Administered 2020-06-21: 1 mg via INTRAVENOUS

## 2020-06-21 MED ORDER — LIDOCAINE HCL (PF) 1 % IJ SOLN
INTRAMUSCULAR | Status: AC
  Filled 2020-06-21: qty 30

## 2020-06-21 MED ORDER — FENTANYL CITRATE (PF) 100 MCG/2ML IJ SOLN
INTRAMUSCULAR | Status: AC | PRN
  Administered 2020-06-21: 25 ug via INTRAVENOUS

## 2020-06-21 MED ORDER — MIDAZOLAM HCL 2 MG/2ML IJ SOLN
INTRAMUSCULAR | Status: AC
  Filled 2020-06-21: qty 2

## 2020-06-21 MED ORDER — LIDOCAINE-EPINEPHRINE 1 %-1:100000 IJ SOLN
INTRAMUSCULAR | Status: AC
  Filled 2020-06-21: qty 1

## 2020-06-21 MED ORDER — VANCOMYCIN HCL IN DEXTROSE 1-5 GM/200ML-% IV SOLN
1000.0000 mg | INTRAVENOUS | Status: AC
  Administered 2020-06-21: 1000 mg via INTRAVENOUS

## 2020-06-21 MED ORDER — LIDOCAINE-EPINEPHRINE 1 %-1:100000 IJ SOLN
INTRAMUSCULAR | Status: AC | PRN
  Administered 2020-06-21: 20 mL via INTRADERMAL

## 2020-06-21 MED ORDER — SODIUM CHLORIDE 0.9% FLUSH
3.0000 mL | Freq: Two times a day (BID) | INTRAVENOUS | Status: DC
  Administered 2020-06-22 – 2020-06-27 (×7): 3 mL via INTRAVENOUS

## 2020-06-21 MED ORDER — VANCOMYCIN HCL IN DEXTROSE 1-5 GM/200ML-% IV SOLN
INTRAVENOUS | Status: AC
  Filled 2020-06-21: qty 200

## 2020-06-21 MED ORDER — SODIUM CHLORIDE 0.9 % IV SOLN: INTRAVENOUS | Status: DC

## 2020-06-21 MED ORDER — HEPARIN SODIUM (PORCINE) 1000 UNIT/ML IJ SOLN
INTRAMUSCULAR | Status: AC | PRN
  Administered 2020-06-21: 1000 [IU] via INTRAVENOUS

## 2020-06-21 MED ORDER — TORSEMIDE 20 MG PO TABS
100.0000 mg | ORAL_TABLET | Freq: Every day | ORAL | Status: DC
  Administered 2020-06-22 – 2020-06-26 (×4): 100 mg via ORAL
  Filled 2020-06-21 (×6): qty 5

## 2020-06-21 NOTE — Patient Outreach (Signed)
[place] [facility]) Care Management  06/21/2020  Latona Krichbaum Munter 03/09/1956 [id number]   Red Emmi: Reason for alert:  Hospital follow up--no                              Other questions or problems----yes   Placed call to patient who reports to me that she is still admitted to the hospital.  I apologized for the call.  PLAN: will remove from [place] calls until discharged. In basket message sent to case management assistant.  Tomasa Rand, RN, BSN, CEN [facility] [employer] [phone]

## 2020-06-21 NOTE — Progress Notes (Signed)
[facility] Progress Note  Name: Donna Howe MRN: [id number] DOB: 1956/09/04  Chief Complaint:  fatigue  Subjective:  S/p TDC placement this AM, awaiting HD #1.  No AM labs.  UOP 3.8L yesterday  Review of systems:  States that her breathing is ok at rest - she's not really tried to move around much  Chest pain after [facility] placement today Nausea and doesn't have good appetite  -------- Background on consult:  Donna Howe is a 64 y.o. female with a history including coronary artery disease, advanced CKD (biopsy-proven DM CKD) hypertension and type 2 diabetes mellitus who presented to the hospital at [facility] on 10/1 - she states that she was tired and slurring her words and her family called her and was worried.  Note that she had just recently been discharged from admission with NSTEMI on 9/30.  She reported fatigue and was found to have marked hyperkalemia with a K of 6.7.  Overnight early this morning around 5 she developed chest pain and cardiology has been consulted; she also had left arm pain and some nausea.  They are starting a nitro gtt.  She states that she has had chest pain like this off and on for about a week with her recent event.  She was seen by nephrology at her recent hospitalization and had been planning for access placement for dialysis initiation at some point soon however states she missed a pre-op appointment appointment due to her hospitalization and she states she is rescheduled for a couple of months from now.  Cardiology is planning on a cath next week unless needed emergently and at this time would recommend deferring permanent access placement until the cath.  Note that she was admitted within the last week to [facility] with an NSTEMI and managed medically at that time due to advanced kidney disease.  She had a GFR of 15 earlier this year in 12/2019.  She had 1.3 liters UOP over 10/1 charted.  Got lasix 80 mg IV overnight x 2.  She has been on 4  liters oxygen here and not on any at home.  Chest pain is 0/10 as of my exam on the nitro.  We discussed risks/benefits/indications for RRT and she consents to dialysis.  She thinks hemo would work best for her - worried about doing things right and had a prior abd hernia s/p repair.  She states right leg always more swollen than left since CABG and legs actually look better than normal now because she's been off of her feet.     Intake/Output Summary (Last 24 hours) at 06/21/2020 1024 Last data filed at 06/21/2020 0700 Gross per 24 hour  Intake 309.02 ml  Output 3800 ml  Net -3490.98 ml    Vitals:  Vitals:   06/21/20 0910 06/21/20 0915 06/21/20 0920 06/21/20 0925  BP: (!) 164/58 (!) 168/55 (!) 152/50 (!) 145/49  Pulse: 71 74 74 75  Resp: 15 17 16 15   Temp:      TempSrc:      SpO2: 100% 100% 100% 98%  Weight:      Height:         Physical Exam:  General: adult female in bed in NAD at rest, sleepy HEENT: NCAT Eyes: EOMI sclera anicteric Neck: supple trachea midline Heart: S1S2 no rub Lungs: clear but reduced; rare basilar crackles; unlabored at rest; on 3 liters oxygen  Abdomen: softly dist/obese habitus/nontender Extremities: right leg 2+ edema and left leg with  1+ edema Skin: no rash on extremities exposed Psych normal mood and affect Neuro: alert and oriented x 3; provides hx and follows commands  Medications reviewed   Labs:  BMP Latest Ref Rng & Units 06/20/2020 06/19/2020 06/19/2020  Glucose 70 - 99 mg/dL 138(H) - 220(H)  BUN 8 - 23 mg/dL 106(H) - 110(H)  Creatinine 0.44 - 1.00 mg/dL 5.60(H) - 5.68(H)  Sodium 135 - 145 mmol/L 137 - 135  Potassium 3.5 - 5.1 mmol/L 4.5 4.7 5.7(H)  Chloride 98 - 111 mmol/L 100 - 100  CO2 22 - 32 mmol/L 25 - 21(L)  Calcium 8.9 - 10.3 mg/dL 8.3(L) - 8.8(L)     Assessment/Plan:   # NSTEMI  - d/w cardiology - LHC planned for tomorrow - noted cardiology has stopped beta blocker due to bradycardia  # CKD stage V with progression to  end-stage renal disease -s/p TDC 10/4, 1st HD today, 2nd tomorrow working around [facility]. - hold on permanent access in setting of NSTEMI but hopefully will be able to place prior to d/c.  Vein mapping ordered. - She will need CLIP to outpatient HD unit   # Hyperkalemia - improved with medical management - for HD as above, check at start of dialysis today  # Acute on chronic diastolic CHF  - lasix 80 mg IV today, rec'd metolazone 5 mg PO yesterday - gentle 0.5L UF with 1st HD today - will switch to po diuretics tomorrow  # HTN  - nitro gtt for chest pain per cardiology and beta blocker was stopped for bradycardia   # Anemia of CKD  - Defer ESA currently in setting of NSTEMI  - Transfuse as needed - if primary wishes with HD alert dialysis please  - appears iron is ok (ferritin low but iron level and tsat ok)  Donna Mend, MD 06/21/2020 10:24 AM

## 2020-06-21 NOTE — Procedures (Signed)
Pre-procedure Diagnosis: ESRD Post-procedure Diagnosis: Same  Successful placement of tunneled HD catheter with tips terminating within the superior aspect of the right atrium.    Complications: None Immediate  EBL: Minimal   The catheter is ready for immediate use.   Jay Iosefa Weintraub, MD Pager #: [phone]   

## 2020-06-21 NOTE — Progress Notes (Addendum)
Rounded on patient today in correlation to transition to outpatient HD. Ordered consult to dietician and Kidney Failure Book. Patient educated at the bedside regarding care of tunneled dialysis catheter, care of AV fistula/graft once placed, and proper medication administration on HD days.  Patient capable of verbalizing via teach back method. Educated patient on services are available to her through the interdisciplinary team in the clinic setting.  Patient verbalizes nervousness about going to have her first HD treatment and what it will feel like as well as placement of an AVF. Empathetic to patient's concerns. Handouts and contact information provided to patient for any further questions. Will follow as appropriate.   Dorthey Sawyer, RN  Dialysis Nurse Coordinator Phone: [phone]

## 2020-06-21 NOTE — Progress Notes (Addendum)
Bilateral upper extremity vein mapping has been completed. Preliminary results can be found in CV Proc through chart review.  Results were given to the patient's nurse, Noelle.  06/21/20 5:22 PM Carlos Levering RVT

## 2020-06-21 NOTE — Progress Notes (Signed)
Progress Note  Patient Name: Donna Howe Date of Encounter: 06/21/2020  [facility] Cardiologist: Rozann Lesches, MD   Subjective   Intermittent chest spasm feeling. No sustained pressure. Breathing stable.   Inpatient Medications    Scheduled Meds: . ALPRAZolam  0.25 mg Oral BID  . ALPRAZolam  0.5 mg Oral QHS  . aspirin EC  81 mg Oral Daily  . atorvastatin  80 mg Oral QPM  . Chlorhexidine Gluconate Cloth  6 each Topical Q0600  . Chlorhexidine Gluconate Cloth  6 each Topical Q0600  . clopidogrel  75 mg Oral Daily  . fentaNYL      . ferrous sulfate  325 mg Oral Q lunch  . furosemide  80 mg Intravenous Once  . heparin  5,000 Units Subcutaneous Q8H  . heparin sodium (porcine)      . insulin aspart  0-5 Units Subcutaneous QHS  . insulin aspart  0-9 Units Subcutaneous TID WC  . insulin glargine  10 Units Subcutaneous Daily  . levothyroxine  125 mcg Oral Daily  . lidocaine-EPINEPHrine      . midazolam      . pantoprazole  40 mg Oral BID  . sodium bicarbonate  650 mg Oral BID  . Vitamin D (Ergocalciferol)  50,000 Units Oral Q Mon   Continuous Infusions: . calcium chloride  IV    . nitroGLYCERIN 5 mcg/min (06/19/20 0936)  . vancomycin     PRN Meds: acetaminophen **OR** acetaminophen, albuterol, morphine injection, nitroGLYCERIN, ondansetron (ZOFRAN) IV   Vital Signs    Vitals:   06/21/20 0910 06/21/20 0915 06/21/20 0920 06/21/20 0925  BP: (!) 164/58 (!) 168/55 (!) 152/50 (!) 145/49  Pulse: 71 74 74 75  Resp: 15 17 16 15   Temp:      TempSrc:      SpO2: 100% 100% 100% 98%  Weight:      Height:        Intake/Output Summary (Last 24 hours) at 06/21/2020 1011 Last data filed at 06/21/2020 0700 Gross per 24 hour  Intake 309.02 ml  Output 3800 ml  Net -3490.98 ml   Last 3 Weights 06/21/2020 06/20/2020 06/19/2020  Weight (lbs) 225 lb 4.8 oz 229 lb 8 oz 233 lb 14.5 oz  Weight (kg) 102.195 kg 104.1 kg 106.1 kg      Telemetry    Sinus rhythm - Personally  Reviewed  ECG    N/A  Physical Exam   GEN: No acute distress.   Neck: No JVD Cardiac: RRR, no murmurs, rubs, or gallops.  Respiratory: Clear to auscultation bilaterally. GI: Soft, nontender, non-distended  MS: No edema; No deformity. Neuro:  Nonfocal  Psych: Normal affect   Labs    High Sensitivity Troponin:   Recent Labs  Lab 06/13/20 1642 06/18/20 1543 06/18/20 2135 06/19/20 1038 06/20/20 0659  TROPONINIHS 141* 482* 955* 978* 776*      Chemistry Recent Labs  Lab 06/17/20 0551 06/18/20 1543 06/18/20 2135 06/18/20 2135 06/19/20 0236 06/19/20 1038 06/20/20 0251  NA 137   < > 135  --  135  --  137  K 5.3*   < > 6.5*   < > 5.7* 4.7 4.5  CL 106   < > 103  --  100  --  100  CO2 24   < > 21*  --  21*  --  25  GLUCOSE 118*   < > 290*  --  220*  --  138*  BUN 91*   < >  109*  --  110*  --  106*  CREATININE 4.43*   < > 5.57*  --  5.68*  --  5.60*  CALCIUM 8.4*   < > 8.8*  --  8.8*  --  8.3*  PROT  --   --   --   --  5.5*  --   --   ALBUMIN 2.9*  --   --   --  2.8*  --  2.5*  AST  --   --   --   --  129*  --   --   ALT  --   --   --   --  211*  --   --   ALKPHOS  --   --   --   --  104  --   --   BILITOT  --   --   --   --  1.2  --   --   GFRNONAA 10*   < > 8*  --  7*  --  8*  GFRAA 12*   < > 9*  --  9*  --  9*  ANIONGAP 7   < > 11  --  14  --  12   < > = values in this interval not displayed.     Hematology Recent Labs  Lab 06/18/20 1543 06/19/20 0236 06/20/20 0929  WBC 11.5* 13.3* 9.0  RBC 3.00* 2.64* 2.52*  HGB 9.2* 7.7* 7.4*  HCT 27.5* 24.0* 23.2*  MCV 91.7 90.9 92.1  MCH 30.7 29.2 29.4  MCHC 33.5 32.1 31.9  RDW 14.9 15.1 15.6*  PLT 88* 90* 94*    BNP Recent Labs  Lab 06/18/20 1549  BNP 2,129.0*     DDimer No results for input(s): DDIMER in the last 168 hours.   Radiology    IR Fluoro Guide CV Line Right  Result Date: 06/21/2020 INDICATION: End-stage renal disease. In need of durable access for the initiation of hemodialysis. EXAM:  TUNNELED CENTRAL VENOUS HEMODIALYSIS CATHETER PLACEMENT WITH ULTRASOUND AND FLUOROSCOPIC GUIDANCE MEDICATIONS: Vancomycin 1 gm IV . The antibiotic was given in an appropriate time interval prior to skin puncture. ANESTHESIA/SEDATION: Moderate (conscious) sedation was employed during this procedure. A total of Versed 1.5 mg and Fentanyl 550 mcg was administered intravenously. Moderate Sedation Time: 10 minutes. The patient's level of consciousness and vital signs were monitored continuously by radiology nursing throughout the procedure under my direct supervision. FLUOROSCOPY TIME:  18 seconds (5 mGy) COMPLICATIONS: None immediate. PROCEDURE: Informed written consent was obtained from the patient after a discussion of the risks, benefits, and alternatives to treatment. Questions regarding the procedure were encouraged and answered. The right neck and chest were prepped with chlorhexidine in a sterile fashion, and a sterile drape was applied covering the operative field. Maximum barrier sterile technique with sterile gowns and gloves were used for the procedure. A timeout was performed prior to the initiation of the procedure. After creating a small venotomy incision, a micropuncture kit was utilized to access the internal jugular vein. Real-time ultrasound guidance was utilized for vascular access including the acquisition of a permanent ultrasound image documenting patency of the accessed vessel. The microwire was utilized to measure appropriate catheter length. A stiff Glidewire was advanced to the level of the IVC and the micropuncture sheath was exchanged for a peel-away sheath. A palindrome tunneled hemodialysis catheter measuring 23 cm from tip to cuff was tunneled in a retrograde fashion from the anterior chest wall to the  venotomy incision. The catheter was then placed through the peel-away sheath with tips ultimately positioned within the superior aspect of the right atrium. Final catheter positioning was  confirmed and documented with a spot radiographic image. The catheter aspirates and flushes normally. The catheter was flushed with appropriate volume heparin dwells. The catheter exit site was secured with a 0-Prolene retention suture. The venotomy incision was closed with an interrupted 4-0 Vicryl, Dermabond and Steri-strips. Dressings were applied. The patient tolerated the procedure well without immediate post procedural complication. IMPRESSION: Successful placement of 23 cm tip to cuff tunneled hemodialysis catheter via the right internal jugular vein with tips terminating within the superior aspect of the right atrium. The catheter is ready for immediate use. Electronically Signed   By: Sandi Mariscal M.D.   On: 06/21/2020 09:39   IR US Guide Vasc Access Right  Result Date: 06/21/2020 INDICATION: End-stage renal disease. In need of durable access for the initiation of hemodialysis. EXAM: TUNNELED CENTRAL VENOUS HEMODIALYSIS CATHETER PLACEMENT WITH ULTRASOUND AND FLUOROSCOPIC GUIDANCE MEDICATIONS: Vancomycin 1 gm IV . The antibiotic was given in an appropriate time interval prior to skin puncture. ANESTHESIA/SEDATION: Moderate (conscious) sedation was employed during this procedure. A total of Versed 1.5 mg and Fentanyl 550 mcg was administered intravenously. Moderate Sedation Time: 10 minutes. The patient's level of consciousness and vital signs were monitored continuously by radiology nursing throughout the procedure under my direct supervision. FLUOROSCOPY TIME:  18 seconds (5 mGy) COMPLICATIONS: None immediate. PROCEDURE: Informed written consent was obtained from the patient after a discussion of the risks, benefits, and alternatives to treatment. Questions regarding the procedure were encouraged and answered. The right neck and chest were prepped with chlorhexidine in a sterile fashion, and a sterile drape was applied covering the operative field. Maximum barrier sterile technique with sterile gowns and  gloves were used for the procedure. A timeout was performed prior to the initiation of the procedure. After creating a small venotomy incision, a micropuncture kit was utilized to access the internal jugular vein. Real-time ultrasound guidance was utilized for vascular access including the acquisition of a permanent ultrasound image documenting patency of the accessed vessel. The microwire was utilized to measure appropriate catheter length. A stiff Glidewire was advanced to the level of the IVC and the micropuncture sheath was exchanged for a peel-away sheath. A palindrome tunneled hemodialysis catheter measuring 23 cm from tip to cuff was tunneled in a retrograde fashion from the anterior chest wall to the venotomy incision. The catheter was then placed through the peel-away sheath with tips ultimately positioned within the superior aspect of the right atrium. Final catheter positioning was confirmed and documented with a spot radiographic image. The catheter aspirates and flushes normally. The catheter was flushed with appropriate volume heparin dwells. The catheter exit site was secured with a 0-Prolene retention suture. The venotomy incision was closed with an interrupted 4-0 Vicryl, Dermabond and Steri-strips. Dressings were applied. The patient tolerated the procedure well without immediate post procedural complication. IMPRESSION: Successful placement of 23 cm tip to cuff tunneled hemodialysis catheter via the right internal jugular vein with tips terminating within the superior aspect of the right atrium. The catheter is ready for immediate use. Electronically Signed   By: Sandi Mariscal M.D.   On: 06/21/2020 09:39   ECHOCARDIOGRAM LIMITED  Result Date: 06/19/2020    ECHOCARDIOGRAM LIMITED REPORT   Patient Name:   BATOOL MAJID Date of Exam: 06/19/2020 Medical Rec #:  [id number]  Height:       66.0 in Accession #:    [id number]       Weight:       233.9 lb Date of Birth:  02-Aug-1956       BSA:           2.137 m Patient Age:    17 years         BP:           136/52 mmHg Patient Gender: F                HR:           85 bpm. Exam Location:  Inpatient Procedure: Color Doppler, Limited Echo and Intracardiac Opacification Agent Indications:    Chest pain  History:        Patient has prior history of Echocardiogram examinations, most                 recent 06/13/2020. CAD and Previous Myocardial Infarction, Prior                 CABG; Risk Factors:Hypertension, Diabetes, Dyslipidemia and                 Current Smoker. CKD.  Sonographer:    Clayton Lefort RDCS (AE) Referring Phys: [id number] Arnoldo Lenis  Sonographer Comments: Patient is morbidly obese. Image acquisition challenging due to patient body habitus. IMPRESSIONS  1. Left ventricular ejection fraction, by estimation, is 55 to 60%. The left ventricle has normal function. The left ventricle has no regional wall motion abnormalities.  2. The mitral valve is normal in structure. Mild to moderate mitral valve regurgitation. No evidence of mitral stenosis.  3. Limited echocardiogram to [place] LV function and wall motion. FINDINGS  Left Ventricle: Left ventricular ejection fraction, by estimation, is 55 to 60%. The left ventricle has normal function. The left ventricle has no regional wall motion abnormalities. Definity contrast agent was given IV to delineate the left ventricular  endocardial borders. Mitral Valve: The mitral valve is normal in structure. Mild to moderate mitral valve regurgitation. No evidence of mitral valve stenosis. LEFT VENTRICLE PLAX 2D LVIDd:         5.10 cm LVIDs:         3.40 cm LV PW:         1.50 cm LV IVS:        1.50 cm LVOT diam:     1.70 cm LVOT Area:     2.27 cm  LEFT ATRIUM         Index LA diam:    3.90 cm 1.82 cm/m   AORTA Ao Root diam: 3.30 cm  SHUNTS Systemic Diam: 1.70 cm Carlyle Dolly MD Electronically signed by Carlyle Dolly MD Signature Date/Time: 06/19/2020/3:14:35 PM    Final     Cardiac Studies   Echo  06/19/2020 1. Left ventricular ejection fraction, by estimation, is 55 to 60%. The  left ventricle has normal function. The left ventricle has no regional  wall motion abnormalities.  2. The mitral valve is normal in structure. Mild to moderate mitral valve  regurgitation. No evidence of mitral stenosis.  3. Limited echocardiogram to [place] LV function and wall motion.   Patient Profile     64 y.o. female with hx of CAD s/p CABG in 2005 and multiple PCI's afterwards, HTN, DM, HLD, CKD stage IV presented 06/18/20 (one day after discharge) with weakness and confusion. Found to have bradycardia in 40s  and hyperkalemia at 6.7 with Scr of 5.18.  PTCA to SVG-OM1 in 01/2004,PTCA/DES to LM and prox LCxin 03/2004 and DES to SVG-OM1, PTCA/DES to SVG-OMin 01/2011,PTCA/DES to LM and LCxin 2015, andcutting balloon angioplasty to prox Cx and DES to SVG-RCAin 05/2017, NSTEMI in 03/2018 with DES to mid-RCA and DES to LIMA-LAD.  Most recently admitted to [facility] end of sep 2021 with NSTEMI. Hs troponin peaked at 160 at that time. Cath deferred due to advanced CKD. Treated with heparin for 48 hours. Remained on DAPT with ASA and plavix. Started on Ranexa.   Assessment & Plan    1. NSTEMI - Hs-troponin [phone] this admission. Started on nitro gtt due to recurrent chest pain which has been improved. Avoided heparin due to low platelets and anemia.  - Echo showed LVEF of 55-60% and no WM abnormality  - mild chest spasm on nitro gtt - Now s/p tunneled HD placement this morning - Likely cath tomorrow  - Stopped BB due to bradycardia on admit  - Continue ASA, Plavix and Statin   2. Bradycardia - Resolved with discontinuation of BB  3. CKD V - S/p HD tunneled placement today - Plan to dialysis later today per patient  4. Anemia - Hgb 8.9>8.7>8.2>7.7>7.4 yesterday - Repeat lab today  - Keep Hgb above 8  - Transfuse per primary team   5. HLD - 06/13/2020: Cholesterol 133; HDL 32; LDL  Cholesterol 67; Triglycerides 169; VLDL 34  - Continue statin  For questions or updates, please contact [place] Please consult [email or web address] for contact info under        SignedLeanor Kail, PA  06/21/2020, 10:11 AM

## 2020-06-21 NOTE — Progress Notes (Signed)
PROGRESS NOTE    Donna Howe   ACZ:[id number]  DOB: 1955/11/29  DOA: 06/18/2020 PCP: [facility]   Brief Narrative:  Donna Howe  is a 64 y.o. female, with a history of diabetes mellitus type 2, hyperlipidemia, hypertension, CKD stage V, coronary artery diseasewith NSTEMI and DES, right carotid stenosis, peripheral arterial disease, patient with recent hospitalization at [facility] for NSTEMI, was treated with heparin GTT, started on low-dose metoprolol, she was just discharged on 9/30.  She returns on 10/1 to [facility] for weakness, mild confusion and slurred speech.  In ED > bradycardic with HR in 40s, K 6.7. cr 5.18.   Given Atropine, Bicarb, Calcium gluconate, Kayexalate, Insulin. Transferred from [facility] to [facility] to start dialysis.    Subjective: Having chest pain again today after dialysis cath placed this AM.    Assessment & Plan:   Principal Problem:   Acute renal failure superimposed on stage 5 chronic kidney disease, not on chronic dialysis - she has signs of uremia with nausea, poor appetite, fatigue - nephology plans on hemodialysis once dialysis access placed- dialysis cath placed today  Active Problems:   Hyperkalemia with Bradycardia - resolved with treatment   NSTEMI with extensive CAD and h/o CABG - she had an NSTEMI on 9/26 but due to CKD5, did not undergo a cath - Troponin rising to 900s today - having left sided chest pain and is now requiring a Nitro infusion and PRN Morphine - cardiology consulting -cardiology holding off on heparin in setting of thrombocytopenia and uremia to prevent bleeding - cont Aspirin, Lipitor, Plavix - 2 D ECHO today does not show and WMA  - cardiology plans an left heart cath later this week  Acute on Chronic diastolic CHF - she is fluid overloaded - IV diuretics per cardiology and nephrology    Diabetes mellitus2 with renal manifestation  - uses 70/30 Novolin (Relion) at home- d/c this - A1c  6.2 - SSI and lantus ordered    Time spent in minutes: 35 DVT prophylaxis: heparin injection 5,000 Units Start: 06/19/20 1400 SCDs Start: 06/18/20 2154  Code Status: full code Family Communication:  Disposition Plan:  Status is: Inpatient  Remains inpatient appropriate because:AKI, NSTEMI and Acute CHF   Dispo: The patient is from: Home              Anticipated d/c is to: Home              Anticipated d/c date is: > 3 days              Patient currently is not medically stable to d/c.      Consultants:   Nephrology  Cardiology  Procedures:  . Left ventricular ejection fraction, by estimation, is 55 to 60%. The  left ventricle has normal function. The left ventricle has no regional  wall motion abnormalities.  2. The mitral valve is normal in structure. Mild to moderate mitral valve  regurgitation. No evidence of mitral stenosis.  3. Limited echocardiogram to reevalaute LV function and wall motion.   Antimicrobials:  Anti-infectives (From admission, onward)   Start     Dose/Rate Route Frequency Ordered Stop   06/21/20 0855  vancomycin (VANCOCIN) 1-5 GM/200ML-% IVPB       Note to Pharmacy: Arlean Hopping   : cabinet override      06/21/20 0855 06/21/20 1159   06/21/20 0845  vancomycin (VANCOCIN) IVPB 1000 mg/200 mL premix  1,000 mg 200 mL/hr over 60 Minutes Intravenous To Radiology 06/21/20 0833 06/21/20 0956       Objective: Vitals:   06/21/20 0925 06/21/20 1200 06/21/20 1400 06/21/20 1500  BP: (!) 145/49 (!) 133/58 111/84 105/66  Pulse: 75 64 60 67  Resp: 15 20 (!) 24 (!) 9  Temp:      TempSrc:      SpO2: 98% 97% 99% 94%  Weight:      Height:        Intake/Output Summary (Last 24 hours) at 06/21/2020 1538 Last data filed at 06/21/2020 0700 Gross per 24 hour  Intake 4.51 ml  Output 3800 ml  Net -3795.49 ml   Filed Weights   06/19/20 0400 06/20/20 0350 06/21/20 0337  Weight: 106.1 kg 104.1 kg 102.2 kg    Examination: General exam:  Appears comfortable  HEENT: PERRLA, oral mucosa moist, no sclera icterus or thrush Respiratory system: Clear to auscultation. Respiratory effort normal. Cardiovascular system: S1 & S2 heard,  No murmurs  Gastrointestinal system: Abdomen soft, non-tender, nondistended. Normal bowel sounds  Central nervous system: Alert and oriented. No focal neurological deficits. Extremities: No cyanosis, clubbing - + pedal edema Skin: No rashes or ulcers Psychiatry:  Mood & affect appropriate.     Data Reviewed: I have personally reviewed following labs and imaging studies  CBC: Recent Labs  Lab 06/17/20 0551 06/18/20 1543 06/19/20 0236 06/20/20 0929 06/21/20 1043  WBC 9.7 11.5* 13.3* 9.0 8.0  NEUTROABS  --  9.7*  --   --   --   HGB 8.7* 9.2* 7.7* 7.4* 7.8*  HCT 26.6* 27.5* 24.0* 23.2* 23.9*  MCV 92.4 91.7 90.9 92.1 92.3  PLT 101* 88* 90* 94* 007*   Basic Metabolic Panel: Recent Labs  Lab 06/15/20 0544 06/15/20 0544 06/16/20 0604 06/16/20 0604 06/17/20 0551 06/17/20 0551 06/18/20 1543 06/18/20 1543 06/18/20 1726 06/18/20 1726 06/18/20 2135 06/19/20 0236 06/19/20 1038 06/20/20 0251 06/21/20 1043  NA 134*  134*   < > 136   < > 137   < > 132*   < > 133*  --  135 135  --  137 138  K 5.3*  5.3*   < > 5.4*   < > 5.3*   < > 6.7*   < > 6.7*   < > 6.5* 5.7* 4.7 4.5 3.4*  CL 105  104   < > 106   < > 106   < > 101   < > 102  --  103 100  --  100 96*  CO2 21*  21*   < > 23   < > 24   < > 20*   < > 19*  --  21* 21*  --  25 27  GLUCOSE 254*  254*   < > 125*   < > 118*   < > 306*   < > 304*  --  290* 220*  --  138* 221*  BUN 90*  89*   < > 92*   < > 91*   < > 106*   < > 110*  --  109* 110*  --  106* 103*  CREATININE 4.60*  4.64*   < > 4.50*   < > 4.43*   < > 5.18*   < > 5.13*  --  5.57* 5.68*  --  5.60* 5.72*  CALCIUM 8.1*  8.1*   < > 8.3*   < > 8.4*   < > 8.6*   < >  8.5*  --  8.8* 8.8*  --  8.3* 8.1*  MG 2.1  --   --   --   --   --  2.6*  --   --   --   --   --   --   --   --   PHOS  4.8*  --  4.1  --  4.0  --   --   --   --   --   --   --   --  5.7*  --    < > = values in this interval not displayed.   GFR: Estimated Creatinine Clearance: 12.5 mL/min (A) (by C-G formula based on SCr of 5.72 mg/dL (H)). Liver Function Tests: Recent Labs  Lab 06/15/20 0544 06/16/20 0604 06/17/20 0551 06/19/20 0236 06/20/20 0251  AST  --   --   --  129*  --   ALT  --   --   --  211*  --   ALKPHOS  --   --   --  104  --   BILITOT  --   --   --  1.2  --   PROT  --   --   --  5.5*  --   ALBUMIN 2.8* 2.9* 2.9* 2.8* 2.5*   No results for input(s): LIPASE, AMYLASE in the last 168 hours. No results for input(s): AMMONIA in the last 168 hours. Coagulation Profile: No results for input(s): INR, PROTIME in the last 168 hours. Cardiac Enzymes: No results for input(s): CKTOTAL, CKMB, CKMBINDEX, TROPONINI in the last 168 hours. BNP (last 3 results) No results for input(s): PROBNP in the last 8760 hours. HbA1C: No results for input(s): HGBA1C in the last 72 hours. CBG: Recent Labs  Lab 06/20/20 1130 06/20/20 1657 06/20/20 2106 06/21/20 1022 06/21/20 1222  GLUCAP 185* 160* 202* 209* 166*   Lipid Profile: No results for input(s): CHOL, HDL, LDLCALC, TRIG, CHOLHDL, LDLDIRECT in the last 72 hours. Thyroid Function Tests: Recent Labs    06/18/20 1543  TSH 1.057   Anemia Panel: No results for input(s): VITAMINB12, FOLATE, FERRITIN, TIBC, IRON, RETICCTPCT in the last 72 hours. Urine analysis:    Component Value Date/Time   COLORURINE YELLOW 06/18/2020 1536   APPEARANCEUR CLOUDY (A) 06/18/2020 1536   LABSPEC 1.015 06/18/2020 1536   PHURINE 5.0 06/18/2020 1536   GLUCOSEU 150 (A) 06/18/2020 1536   HGBUR NEGATIVE 06/18/2020 [place] 06/18/2020 1536   KETONESUR NEGATIVE 06/18/2020 1536   PROTEINUR >=300 (A) 06/18/2020 1536   UROBILINOGEN 0.2 10/26/2013 2229   NITRITE NEGATIVE 06/18/2020 1536   LEUKOCYTESUR NEGATIVE 06/18/2020 1536   Sepsis  Labs: @LABRCNTIP (procalcitonin:4,lacticidven:4) ) Recent Results (from the past 240 hour(s))  Respiratory Panel by RT PCR (Flu A&B, Covid) - Nasopharyngeal Swab     Status: None   Collection Time: 06/12/20 10:57 PM   Specimen: Nasopharyngeal Swab  Result Value Ref Range Status   SARS Coronavirus 2 by RT PCR NEGATIVE NEGATIVE Final    Comment: (NOTE) SARS-CoV-2 target nucleic acids are NOT DETECTED.  The SARS-CoV-2 RNA is generally detectable in upper respiratoy specimens during the acute phase of infection. The lowest concentration of SARS-CoV-2 viral copies this assay can detect is 131 copies/mL. A negative result does not preclude SARS-Cov-2 infection and should not be used as the sole basis for treatment or other patient management decisions. A negative result may occur with  improper specimen collection/handling, submission of specimen other than nasopharyngeal swab,  presence of viral mutation(s) within the areas targeted by this assay, and inadequate number of viral copies (<131 copies/mL). A negative result must be combined with clinical observations, patient history, and epidemiological information. The expected result is Negative.  Fact Sheet for Patients:  PinkCheek.be  Fact Sheet for Healthcare Providers:  GravelBags.it  This test is no t yet approved or cleared by the [place] FDA and  has been authorized for detection and/or diagnosis of SARS-CoV-2 by FDA under an Emergency Use Authorization (EUA). This EUA will remain  in effect (meaning this test can be used) for the duration of the COVID-19 declaration under Section 564(b)(1) of the Act, 21 U.S.C. section 360bbb-3(b)(1), unless the authorization is terminated or revoked sooner.     Influenza A by PCR NEGATIVE NEGATIVE Final   Influenza B by PCR NEGATIVE NEGATIVE Final    Comment: (NOTE) The Xpert Xpress SARS-CoV-2/FLU/RSV assay is intended as an  aid in  the diagnosis of influenza from Nasopharyngeal swab specimens and  should not be used as a sole basis for treatment. Nasal washings and  aspirates are unacceptable for Xpert Xpress SARS-CoV-2/FLU/RSV  testing.  Fact Sheet for Patients: PinkCheek.be  Fact Sheet for Healthcare Providers: GravelBags.it  This test is not yet approved or cleared by the [place] FDA and  has been authorized for detection and/or diagnosis of SARS-CoV-2 by  FDA under an Emergency Use Authorization (EUA). This EUA will remain  in effect (meaning this test can be used) for the duration of the  Covid-19 declaration under Section 564(b)(1) of the Act, 21  U.S.C. section 360bbb-3(b)(1), unless the authorization is  terminated or revoked. Performed at [facility], [address] [place], [place] [address]   MRSA PCR Screening     Status: None   Collection Time: 06/13/20  6:51 PM   Specimen: Nasopharyngeal  Result Value Ref Range Status   MRSA by PCR NEGATIVE NEGATIVE Final    Comment:        The GeneXpert MRSA Assay (FDA approved for NASAL specimens only), is one component of a comprehensive MRSA colonization surveillance program. It is not intended to diagnose MRSA infection nor to guide or monitor treatment for MRSA infections. Performed at [facility], [address], [place], [place] [address]   Respiratory Panel by RT PCR (Flu A&B, Covid) - Nasopharyngeal Swab     Status: None   Collection Time: 06/18/20  3:34 PM   Specimen: Nasopharyngeal Swab  Result Value Ref Range Status   SARS Coronavirus 2 by RT PCR NEGATIVE NEGATIVE Final    Comment: (NOTE) SARS-CoV-2 target nucleic acids are NOT DETECTED.  The SARS-CoV-2 RNA is generally detectable in upper respiratoy specimens during the acute phase of infection. The lowest concentration of SARS-CoV-2 viral copies this assay can detect is 131 copies/mL. A negative result does not  preclude SARS-Cov-2 infection and should not be used as the sole basis for treatment or other patient management decisions. A negative result may occur with  improper specimen collection/handling, submission of specimen other than nasopharyngeal swab, presence of viral mutation(s) within the areas targeted by this assay, and inadequate number of viral copies (<131 copies/mL). A negative result must be combined with clinical observations, patient history, and epidemiological information. The expected result is Negative.  Fact Sheet for Patients:  PinkCheek.be  Fact Sheet for Healthcare Providers:  GravelBags.it  This test is no t yet approved or cleared by the [place] FDA and  has been authorized for detection and/or  diagnosis of SARS-CoV-2 by FDA under an Emergency Use Authorization (EUA). This EUA will remain  in effect (meaning this test can be used) for the duration of the COVID-19 declaration under Section 564(b)(1) of the Act, 21 U.S.C. section 360bbb-3(b)(1), unless the authorization is terminated or revoked sooner.     Influenza A by PCR NEGATIVE NEGATIVE Final   Influenza B by PCR NEGATIVE NEGATIVE Final    Comment: (NOTE) The Xpert Xpress SARS-CoV-2/FLU/RSV assay is intended as an aid in  the diagnosis of influenza from Nasopharyngeal swab specimens and  should not be used as a sole basis for treatment. Nasal washings and  aspirates are unacceptable for Xpert Xpress SARS-CoV-2/FLU/RSV  testing.  Fact Sheet for Patients: PinkCheek.be  Fact Sheet for Healthcare Providers: GravelBags.it  This test is not yet approved or cleared by the [place] FDA and  has been authorized for detection and/or diagnosis of SARS-CoV-2 by  FDA under an Emergency Use Authorization (EUA). This EUA will remain  in effect (meaning this test can be used) for the  duration of the  Covid-19 declaration under Section 564(b)(1) of the Act, 21  U.S.C. section 360bbb-3(b)(1), unless the authorization is  terminated or revoked. Performed at [facility], [address] [place], [place] [address]          Radiology Studies: IR Fluoro Guide CV Line Right  Result Date: 06/21/2020 INDICATION: End-stage renal disease. In need of durable access for the initiation of hemodialysis. EXAM: TUNNELED CENTRAL VENOUS HEMODIALYSIS CATHETER PLACEMENT WITH ULTRASOUND AND FLUOROSCOPIC GUIDANCE MEDICATIONS: Vancomycin 1 gm IV . The antibiotic was given in an appropriate time interval prior to skin puncture. ANESTHESIA/SEDATION: Moderate (conscious) sedation was employed during this procedure. A total of Versed 1.5 mg and Fentanyl 550 mcg was administered intravenously. Moderate Sedation Time: 10 minutes. The patient's level of consciousness and vital signs were monitored continuously by radiology nursing throughout the procedure under my direct supervision. FLUOROSCOPY TIME:  18 seconds (5 mGy) COMPLICATIONS: None immediate. PROCEDURE: Informed written consent was obtained from the patient after a discussion of the risks, benefits, and alternatives to treatment. Questions regarding the procedure were encouraged and answered. The right neck and chest were prepped with chlorhexidine in a sterile fashion, and a sterile drape was applied covering the operative field. Maximum barrier sterile technique with sterile gowns and gloves were used for the procedure. A timeout was performed prior to the initiation of the procedure. After creating a small venotomy incision, a micropuncture kit was utilized to access the internal jugular vein. Real-time ultrasound guidance was utilized for vascular access including the acquisition of a permanent ultrasound image documenting patency of the accessed vessel. The microwire was utilized to measure appropriate catheter length. A stiff Glidewire was  advanced to the level of the IVC and the micropuncture sheath was exchanged for a peel-away sheath. A palindrome tunneled hemodialysis catheter measuring 23 cm from tip to cuff was tunneled in a retrograde fashion from the anterior chest wall to the venotomy incision. The catheter was then placed through the peel-away sheath with tips ultimately positioned within the superior aspect of the right atrium. Final catheter positioning was confirmed and documented with a spot radiographic image. The catheter aspirates and flushes normally. The catheter was flushed with appropriate volume heparin dwells. The catheter exit site was secured with a 0-Prolene retention suture. The venotomy incision was closed with an interrupted 4-0 Vicryl, Dermabond and Steri-strips. Dressings were applied. The patient tolerated the procedure well without immediate post procedural  complication. IMPRESSION: Successful placement of 23 cm tip to cuff tunneled hemodialysis catheter via the right internal jugular vein with tips terminating within the superior aspect of the right atrium. The catheter is ready for immediate use. Electronically Signed   By: Sandi Mariscal M.D.   On: 06/21/2020 09:39   IR US Guide Vasc Access Right  Result Date: 06/21/2020 INDICATION: End-stage renal disease. In need of durable access for the initiation of hemodialysis. EXAM: TUNNELED CENTRAL VENOUS HEMODIALYSIS CATHETER PLACEMENT WITH ULTRASOUND AND FLUOROSCOPIC GUIDANCE MEDICATIONS: Vancomycin 1 gm IV . The antibiotic was given in an appropriate time interval prior to skin puncture. ANESTHESIA/SEDATION: Moderate (conscious) sedation was employed during this procedure. A total of Versed 1.5 mg and Fentanyl 550 mcg was administered intravenously. Moderate Sedation Time: 10 minutes. The patient's level of consciousness and vital signs were monitored continuously by radiology nursing throughout the procedure under my direct supervision. FLUOROSCOPY TIME:  18 seconds (5  mGy) COMPLICATIONS: None immediate. PROCEDURE: Informed written consent was obtained from the patient after a discussion of the risks, benefits, and alternatives to treatment. Questions regarding the procedure were encouraged and answered. The right neck and chest were prepped with chlorhexidine in a sterile fashion, and a sterile drape was applied covering the operative field. Maximum barrier sterile technique with sterile gowns and gloves were used for the procedure. A timeout was performed prior to the initiation of the procedure. After creating a small venotomy incision, a micropuncture kit was utilized to access the internal jugular vein. Real-time ultrasound guidance was utilized for vascular access including the acquisition of a permanent ultrasound image documenting patency of the accessed vessel. The microwire was utilized to measure appropriate catheter length. A stiff Glidewire was advanced to the level of the IVC and the micropuncture sheath was exchanged for a peel-away sheath. A palindrome tunneled hemodialysis catheter measuring 23 cm from tip to cuff was tunneled in a retrograde fashion from the anterior chest wall to the venotomy incision. The catheter was then placed through the peel-away sheath with tips ultimately positioned within the superior aspect of the right atrium. Final catheter positioning was confirmed and documented with a spot radiographic image. The catheter aspirates and flushes normally. The catheter was flushed with appropriate volume heparin dwells. The catheter exit site was secured with a 0-Prolene retention suture. The venotomy incision was closed with an interrupted 4-0 Vicryl, Dermabond and Steri-strips. Dressings were applied. The patient tolerated the procedure well without immediate post procedural complication. IMPRESSION: Successful placement of 23 cm tip to cuff tunneled hemodialysis catheter via the right internal jugular vein with tips terminating within the superior  aspect of the right atrium. The catheter is ready for immediate use. Electronically Signed   By: Sandi Mariscal M.D.   On: 06/21/2020 09:39      Scheduled Meds: . ALPRAZolam  0.25 mg Oral BID  . ALPRAZolam  0.5 mg Oral QHS  . aspirin EC  81 mg Oral Daily  . atorvastatin  80 mg Oral QPM  . Chlorhexidine Gluconate Cloth  6 each Topical Q0600  . Chlorhexidine Gluconate Cloth  6 each Topical Q0600  . clopidogrel  75 mg Oral Daily  . ferrous sulfate  325 mg Oral Q lunch  . heparin  5,000 Units Subcutaneous Q8H  . insulin aspart  0-5 Units Subcutaneous QHS  . insulin aspart  0-9 Units Subcutaneous TID WC  . insulin glargine  10 Units Subcutaneous Daily  . levothyroxine  125 mcg Oral Daily  . pantoprazole  40 mg Oral BID  . sodium bicarbonate  650 mg Oral BID  . sodium chloride flush  3 mL Intravenous Q12H  . [START ON 06/22/2020] torsemide  100 mg Oral Daily  . Vitamin D (Ergocalciferol)  50,000 Units Oral Q Mon   Continuous Infusions: . calcium chloride  IV    . nitroGLYCERIN 5 mcg/min (06/19/20 0936)     LOS: 3 days      Debbe Odea, MD [facility] Pager: [email or web address] 06/21/2020, 3:38 PM

## 2020-06-22 ENCOUNTER — Inpatient Hospital Stay (HOSPITAL_COMMUNITY)
Admission: EM | Disposition: A | Discharge status: Home Health | Payer: Self-pay | Source: Home / Self Care | Attending: Internal Medicine

## 2020-06-22 ENCOUNTER — Encounter (HOSPITAL_COMMUNITY): Payer: Self-pay | Admitting: Interventional Cardiology

## 2020-06-22 DIAGNOSIS — R001 Bradycardia, unspecified: Secondary | ICD-10-CM | POA: Diagnosis not present

## 2020-06-22 DIAGNOSIS — R0602 Shortness of breath: Secondary | ICD-10-CM | POA: Insufficient documentation

## 2020-06-22 DIAGNOSIS — I2510 Atherosclerotic heart disease of native coronary artery without angina pectoris: Secondary | ICD-10-CM

## 2020-06-22 DIAGNOSIS — N186 End stage renal disease: Secondary | ICD-10-CM

## 2020-06-22 DIAGNOSIS — I25810 Atherosclerosis of coronary artery bypass graft(s) without angina pectoris: Secondary | ICD-10-CM

## 2020-06-22 DIAGNOSIS — I25110 Atherosclerotic heart disease of native coronary artery with unstable angina pectoris: Secondary | ICD-10-CM | POA: Diagnosis not present

## 2020-06-22 HISTORY — PX: LEFT HEART CATH AND CORS/GRAFTS ANGIOGRAPHY: CATH118250

## 2020-06-22 LAB — CBC
HCT: 23.2 % — ABNORMAL LOW (ref 36.0–46.0)
Hemoglobin: 7.7 g/dL — ABNORMAL LOW (ref 12.0–15.0)
MCH: 30.7 pg (ref 26.0–34.0)
MCHC: 33.2 g/dL (ref 30.0–36.0)
MCV: 92.4 fL (ref 80.0–100.0)
Platelets: 104 10*3/uL — ABNORMAL LOW (ref 150–400)
RBC: 2.51 MIL/uL — ABNORMAL LOW (ref 3.87–5.11)
RDW: 14.9 % (ref 11.5–15.5)
WBC: 7.7 10*3/uL (ref 4.0–10.5)
nRBC: 0 % (ref 0.0–0.2)

## 2020-06-22 LAB — BASIC METABOLIC PANEL
Anion gap: 10 (ref 5–15)
BUN: 56 mg/dL — ABNORMAL HIGH (ref 8–23)
CO2: 29 mmol/L (ref 22–32)
Calcium: 8.1 mg/dL — ABNORMAL LOW (ref 8.9–10.3)
Chloride: 99 mmol/L (ref 98–111)
Creatinine, Ser: 3.93 mg/dL — ABNORMAL HIGH (ref 0.44–1.00)
GFR calc non Af Amer: 12 mL/min — ABNORMAL LOW (ref >60)
Glucose, Bld: 165 mg/dL — ABNORMAL HIGH (ref 70–99)
Potassium: 3.2 mmol/L — ABNORMAL LOW (ref 3.5–5.1)
Sodium: 138 mmol/L (ref 135–145)

## 2020-06-22 LAB — GLUCOSE, CAPILLARY
Glucose-Capillary: 179 mg/dL — ABNORMAL HIGH (ref 70–99)
Glucose-Capillary: 149 mg/dL — ABNORMAL HIGH (ref 70–99)
Glucose-Capillary: 230 mg/dL — ABNORMAL HIGH (ref 70–99)

## 2020-06-22 LAB — POCT ACTIVATED CLOTTING TIME: Activated Clotting Time: 153 seconds

## 2020-06-22 LAB — HEPATITIS B SURFACE ANTIGEN: Hepatitis B Surface Ag: NONREACTIVE

## 2020-06-22 SURGERY — LEFT HEART CATH AND CORS/GRAFTS ANGIOGRAPHY
Anesthesia: LOCAL

## 2020-06-22 MED ORDER — SODIUM CHLORIDE 0.9 % IV SOLN: 250.0000 mL | INTRAVENOUS | Status: DC | PRN

## 2020-06-22 MED ORDER — SODIUM CHLORIDE 0.9% FLUSH
3.0000 mL | INTRAVENOUS | Status: DC | PRN
  Administered 2020-06-26: 3 mL via INTRAVENOUS

## 2020-06-22 MED ORDER — HEPARIN SODIUM (PORCINE) 5000 UNIT/ML IJ SOLN
5000.0000 [IU] | Freq: Three times a day (TID) | INTRAMUSCULAR | Status: DC
  Administered 2020-06-22 – 2020-06-30 (×18): 5000 [IU] via SUBCUTANEOUS
  Filled 2020-06-22 (×17): qty 1

## 2020-06-22 MED ORDER — FENTANYL CITRATE (PF) 100 MCG/2ML IJ SOLN
INTRAMUSCULAR | Status: AC
  Filled 2020-06-22: qty 2

## 2020-06-22 MED ORDER — HEPARIN (PORCINE) IN NACL 1000-0.9 UT/500ML-% IV SOLN
INTRAVENOUS | Status: AC
  Filled 2020-06-22: qty 1000

## 2020-06-22 MED ORDER — MIDAZOLAM HCL 2 MG/2ML IJ SOLN
INTRAMUSCULAR | Status: DC | PRN
  Administered 2020-06-22: 1 mg via INTRAVENOUS

## 2020-06-22 MED ORDER — ENSURE ENLIVE PO LIQD
237.0000 mL | Freq: Two times a day (BID) | ORAL | Status: DC
  Administered 2020-06-22 – 2020-06-26 (×5): 237 mL via ORAL

## 2020-06-22 MED ORDER — RENA-VITE PO TABS
1.0000 | ORAL_TABLET | Freq: Every day | ORAL | Status: DC
  Administered 2020-06-22 – 2020-06-29 (×6): 1 via ORAL
  Filled 2020-06-22 (×7): qty 1

## 2020-06-22 MED ORDER — IOHEXOL 350 MG/ML SOLN
INTRAVENOUS | Status: DC | PRN
  Administered 2020-06-22: 160 mL via INTRA_ARTERIAL

## 2020-06-22 MED ORDER — HYDRALAZINE HCL 20 MG/ML IJ SOLN: 10.0000 mg | INTRAMUSCULAR | Status: AC | PRN

## 2020-06-22 MED ORDER — FENTANYL CITRATE (PF) 100 MCG/2ML IJ SOLN
INTRAMUSCULAR | Status: DC | PRN
Start: 2020-06-22 — End: 2020-06-22
  Administered 2020-06-22: 25 ug via INTRAVENOUS

## 2020-06-22 MED ORDER — HEPARIN SODIUM (PORCINE) 1000 UNIT/ML IJ SOLN
INTRAMUSCULAR | Status: AC
  Administered 2020-06-22: 3800 [IU]
  Filled 2020-06-22: qty 4

## 2020-06-22 MED ORDER — ONDANSETRON HCL 4 MG/2ML IJ SOLN
4.0000 mg | Freq: Four times a day (QID) | INTRAMUSCULAR | Status: DC | PRN
  Administered 2020-06-23 – 2020-06-29 (×4): 4 mg via INTRAVENOUS
  Filled 2020-06-22 (×4): qty 2

## 2020-06-22 MED ORDER — HEPARIN (PORCINE) IN NACL 1000-0.9 UT/500ML-% IV SOLN
INTRAVENOUS | Status: DC | PRN
  Administered 2020-06-22 (×2): 500 mL

## 2020-06-22 MED ORDER — POTASSIUM CHLORIDE CRYS ER 20 MEQ PO TBCR
60.0000 meq | EXTENDED_RELEASE_TABLET | Freq: Once | ORAL | Status: AC
  Administered 2020-06-22: 60 meq via ORAL

## 2020-06-22 MED ORDER — SODIUM CHLORIDE 0.9% FLUSH
3.0000 mL | Freq: Two times a day (BID) | INTRAVENOUS | Status: DC
  Administered 2020-06-22 – 2020-06-25 (×6): 3 mL via INTRAVENOUS
  Administered 2020-06-26: 6 mL via INTRAVENOUS
  Administered 2020-06-26 – 2020-06-30 (×7): 3 mL via INTRAVENOUS

## 2020-06-22 MED ORDER — MIDAZOLAM HCL 2 MG/2ML IJ SOLN
INTRAMUSCULAR | Status: AC
  Filled 2020-06-22: qty 2

## 2020-06-22 MED ORDER — LIDOCAINE HCL (PF) 1 % IJ SOLN
INTRAMUSCULAR | Status: AC
  Filled 2020-06-22: qty 30

## 2020-06-22 MED ORDER — LABETALOL HCL 5 MG/ML IV SOLN: 10.0000 mg | INTRAVENOUS | Status: AC | PRN

## 2020-06-22 SURGICAL SUPPLY — 11 items
CATH INFINITI 5 FR IM (CATHETERS) ×2 IMPLANT
CATH INFINITI 5 FR JL3.5 (CATHETERS) ×2 IMPLANT
CATH INFINITI 5FR JL4 (CATHETERS) ×2 IMPLANT
CATH INFINITI 5FR MPB2 (CATHETERS) ×2 IMPLANT
KIT HEART LEFT (KITS) ×2 IMPLANT
PACK CARDIAC CATHETERIZATION (CUSTOM PROCEDURE TRAY) ×2 IMPLANT
SHEATH PINNACLE 5F 10CM (SHEATH) ×2 IMPLANT
SHEATH PROBE COVER 6X72 (BAG) ×2 IMPLANT
TRANSDUCER W/STOPCOCK (MISCELLANEOUS) ×2 IMPLANT
TUBING CIL FLEX 10 FLL-RA (TUBING) ×2 IMPLANT
WIRE EMERALD 3MM-J .035X150CM (WIRE) ×2 IMPLANT

## 2020-06-22 NOTE — Progress Notes (Addendum)
[facility] Progress Note  Name: Donna Howe MRN: [id number] DOB: 05-30-1956  Chief Complaint:  fatigue  Subjective:  Tolerated HD #1 ok yesterday but had some CP following.  Going for LHC today  Review of systems:  States that her breathing is ok at rest - she's not really tried to move around much  NPO currently so can't comment on dysgeusia.  Dinner was no different yesterday.  -------- Background on consult:  Donna Howe is a 64 y.o. female with a history including coronary artery disease, advanced CKD (biopsy-proven DM CKD) hypertension and type 2 diabetes mellitus who presented to the hospital at [facility] on 10/1 - she states that she was tired and slurring her words and her family called her and was worried.  Note that she had just recently been discharged from admission with NSTEMI on 9/30.  She reported fatigue and was found to have marked hyperkalemia with a K of 6.7.  Overnight early this morning around 5 she developed chest pain and cardiology has been consulted; she also had left arm pain and some nausea.  They are starting a nitro gtt.  She states that she has had chest pain like this off and on for about a week with her recent event.  She was seen by nephrology at her recent hospitalization and had been planning for access placement for dialysis initiation at some point soon however states she missed a pre-op appointment appointment due to her hospitalization and she states she is rescheduled for a couple of months from now.  Cardiology is planning on a cath next week unless needed emergently and at this time would recommend deferring permanent access placement until the cath.  Note that she was admitted within the last week to [facility] with an NSTEMI and managed medically at that time due to advanced kidney disease.  She had a GFR of 15 earlier this year in 12/2019.  She had 1.3 liters UOP over 10/1 charted.  Got lasix 80 mg IV overnight x 2.  She has  been on 4 liters oxygen here and not on any at home.  Chest pain is 0/10 as of my exam on the nitro.  We discussed risks/benefits/indications for RRT and she consents to dialysis.  She thinks hemo would work best for her - worried about doing things right and had a prior abd hernia s/p repair.  She states right leg always more swollen than left since CABG and legs actually look better than normal now because she's been off of her feet.     Intake/Output Summary (Last 24 hours) at 06/22/2020 1051 Last data filed at 06/22/2020 0422 Gross per 24 hour  Intake 1175.53 ml  Output 2100 ml  Net -924.47 ml    Vitals:  Vitals:   06/22/20 0330 06/22/20 0400 06/22/20 0413 06/22/20 0422  BP: (!) 142/61  (!) 136/58   Pulse: 65 71 72   Resp: 18  18   Temp: 98 F (36.7 C)  97.7 F (36.5 C)   TempSrc: Oral  Axillary   SpO2: 98% 100% 99%   Weight: 104.1 kg   101.5 kg  Height:         Physical Exam:  General: adult female in bed in NAD  HEENT: NCAT Eyes: EOMI sclera anicteric Neck: supple trachea midline Heart: S1S2 no rub Lungs: clear with normal WOB Abdomen: softly dist/obese habitus/nontender Extremities: 1+ edema Skin: no rash on extremities exposed Psych normal mood and affect Neuro:  alert and oriented x 3; provides hx and follows commands  Medications reviewed   Labs:  BMP Latest Ref Rng & Units 06/22/2020 06/21/2020 06/20/2020  Glucose 70 - 99 mg/dL 165(H) 221(H) 138(H)  BUN 8 - 23 mg/dL 56(H) 103(H) 106(H)  Creatinine 0.44 - 1.00 mg/dL 3.93(H) 5.72(H) 5.60(H)  Sodium 135 - 145 mmol/L 138 138 137  Potassium 3.5 - 5.1 mmol/L 3.2(L) 3.4(L) 4.5  Chloride 98 - 111 mmol/L 99 96(L) 100  CO2 22 - 32 mmol/L 29 27 25   Calcium 8.9 - 10.3 mg/dL 8.1(L) 8.1(L) 8.3(L)     Assessment/Plan:   # NSTEMI  - d/w cardiology - LHC planned for today - noted cardiology has stopped beta blocker due to bradycardia  # CKD stage V with progression to end-stage renal disease -s/p TDC 10/4, 1st HD  yesterday, plan 2nd today - hold on permanent access in setting of NSTEMI but hopefully will be able to place prior to d/c.  Vein mapping ordered. - She will need CLIP to outpatient HD unit   # Hypokalemia: repleted today (had hyperkalemia earlier in course)  # Acute on chronic diastolic CHF  - start po diuretics today - UF with HD  # HTN  - BPs are reasonable  # Anemia of CKD  - Defer ESA currently in setting of NSTEMI  - Transfuse as needed - appears iron is ok (ferritin low but iron level and tsat ok)  Justin Mend, MD 06/22/2020 10:51 AM  ADDENDUM:  CP after LHC => HD #2 deferred until tomorrow AM

## 2020-06-22 NOTE — Progress Notes (Signed)
5 French sheath removed from right femoral artery at 1420. Manual pressure held for 25 minutes. Post hold, patient complained of sharp pain to right groin, slight hematoma was palpated and additional manual pressure was held to right femoral artery for 10 minutes. Site is a level zero at this time; patient is pain free. Bedrest begins at 1500 for four hours. Education provided to patient.

## 2020-06-22 NOTE — Progress Notes (Signed)
PROGRESS NOTE    Donna Howe   YJW:[id number]  DOB: 1955/11/05  DOA: 06/18/2020 PCP: [facility]   Brief Narrative:  Donna Howe  is a 64 y.o. female, with a history of diabetes mellitus type 2, hyperlipidemia, hypertension, CKD stage V, coronary artery diseasewith NSTEMI and DES, right carotid stenosis, peripheral arterial disease, patient with recent hospitalization at [facility] for NSTEMI, was treated with heparin GTT, started on low-dose metoprolol, she was just discharged on 9/30.  She returns on 10/1 to [facility] for weakness, mild confusion and slurred speech.  In ED > bradycardic with HR in 40s, K 6.7. cr 5.18.   Given Atropine, Bicarb, Calcium gluconate, Kayexalate, Insulin. Transferred from [facility] to [facility] to start dialysis.    Subjective: No new complaints.    Assessment & Plan:   Principal Problem:   Acute renal failure superimposed on stage 5 chronic kidney disease, not on chronic dialysis - she has signs of uremia with nausea, poor appetite, fatigue -   dialysis cath placed 10/4 and hemodialysis done- await clipping - vein mapping done  Active Problems:   Hyperkalemia with Bradycardia - resolved with treatment   NSTEMI with extensive CAD and h/o CABG - she had an NSTEMI on 9/26 but due to CKD5, did not undergo a cath - Troponin rising to 900s   - having left sided chest pain and is now requiring a Nitro infusion and PRN Morphine - cardiology consulting -cardiology holding off on heparin in setting of thrombocytopenia and uremia to prevent bleeding - cont Aspirin, Lipitor, Plavix - 2 D ECHO  does not show and WMA  - left heart cath today> has multivessel disease and may need a re-do CABG  Acute on Chronic diastolic CHF - will improve with ongoing dialysis- cont demadex as wel    Diabetes mellitus2 with renal manifestation  - uses 70/30 Novolin (Relion) at home- d/c this - A1c 6.2 - SSI and lantus ordered  AOCD -  management per renal team    Time spent in minutes: 35 DVT prophylaxis: heparin injection 5,000 Units Start: 06/22/20 2200 SCDs Start: 06/18/20 2154  Code Status: full code Family Communication:  Disposition Plan:  Status is: Inpatient  Remains inpatient appropriate because:AKI, NSTEMI and Acute CHF   Dispo: The patient is from: Home              Anticipated d/c is to: Home              Anticipated d/c date is: > 3 days              Patient currently is not medically stable to d/c.      Consultants:   Nephrology  Cardiology  Procedures:  . Left ventricular ejection fraction, by estimation, is 55 to 60%. The  left ventricle has normal function. The left ventricle has no regional  wall motion abnormalities.  2. The mitral valve is normal in structure. Mild to moderate mitral valve  regurgitation. No evidence of mitral stenosis.  3. Limited echocardiogram to reevalaute LV function and wall motion.   Antimicrobials:  Anti-infectives (From admission, onward)   Start     Dose/Rate Route Frequency Ordered Stop   06/21/20 0855  vancomycin (VANCOCIN) 1-5 GM/200ML-% IVPB       Note to Pharmacy: Arlean Hopping   : cabinet override      06/21/20 0855 06/21/20 1159   06/21/20 0845  vancomycin (VANCOCIN) IVPB 1000 mg/200 mL premix  1,000 mg 200 mL/hr over 60 Minutes Intravenous To Radiology 06/21/20 0833 06/21/20 0956       Objective: Vitals:   06/22/20 1440 06/22/20 1444 06/22/20 1450 06/22/20 1500  BP: (!) 116/54 (!) 126/51  (!) 142/52  Pulse: 70 71 69 70  Resp:      Temp:      TempSrc:      SpO2: 95% 90% 100% 100%  Weight:      Height:        Intake/Output Summary (Last 24 hours) at 06/22/2020 1707 Last data filed at 06/22/2020 0422 Gross per 24 hour  Intake 360 ml  Output 2100 ml  Net -1740 ml   Filed Weights   06/22/20 0124 06/22/20 0330 06/22/20 0422  Weight: 104.1 kg 104.1 kg 101.5 kg    Examination: General exam: Appears comfortable    HEENT: PERRLA, oral mucosa moist, no sclera icterus or thrush Respiratory system: Crackles at bases. Respiratory effort normal. Cardiovascular system: S1 & S2 heard,  No murmurs  Gastrointestinal system: Abdomen soft, non-tender, nondistended. Normal bowel sounds  Central nervous system: Alert and oriented. No focal neurological deficits. Extremities: No cyanosis, clubbing - + pedal edema Skin: No rashes or ulcers Psychiatry:  flat affect    Data Reviewed: I have personally reviewed following labs and imaging studies  CBC: Recent Labs  Lab 06/18/20 1543 06/19/20 0236 06/20/20 0929 06/21/20 1043 06/22/20 0725  WBC 11.5* 13.3* 9.0 8.0 7.7  NEUTROABS 9.7*  --   --   --   --   HGB 9.2* 7.7* 7.4* 7.8* 7.7*  HCT 27.5* 24.0* 23.2* 23.9* 23.2*  MCV 91.7 90.9 92.1 92.3 92.4  PLT 88* 90* 94* 106* 250*   Basic Metabolic Panel: Recent Labs  Lab 06/16/20 0604 06/16/20 0604 06/17/20 0551 06/17/20 0551 06/18/20 1543 06/18/20 1726 06/18/20 2135 06/18/20 2135 06/19/20 0236 06/19/20 1038 06/20/20 0251 06/21/20 1043 06/22/20 0725  NA 136   < > 137   < > 132*   < > 135  --  135  --  137 138 138  K 5.4*   < > 5.3*   < > 6.7*   < > 6.5*   < > 5.7* 4.7 4.5 3.4* 3.2*  CL 106   < > 106   < > 101   < > 103  --  100  --  100 96* 99  CO2 23   < > 24   < > 20*   < > 21*  --  21*  --  25 27 29   GLUCOSE 125*   < > 118*   < > 306*   < > 290*  --  220*  --  138* 221* 165*  BUN 92*   < > 91*   < > 106*   < > 109*  --  110*  --  106* 103* 56*  CREATININE 4.50*   < > 4.43*   < > 5.18*   < > 5.57*  --  5.68*  --  5.60* 5.72* 3.93*  CALCIUM 8.3*   < > 8.4*   < > 8.6*   < > 8.8*  --  8.8*  --  8.3* 8.1* 8.1*  MG  --   --   --   --  2.6*  --   --   --   --   --   --   --   --   PHOS 4.1  --  4.0  --   --   --   --   --   --   --  5.7*  --   --    < > = values in this interval not displayed.   GFR: Estimated Creatinine Clearance: 18.1 mL/min (A) (by C-G formula based on SCr of 3.93 mg/dL  (H)). Liver Function Tests: Recent Labs  Lab 06/16/20 0604 06/17/20 0551 06/19/20 0236 06/20/20 0251  AST  --   --  129*  --   ALT  --   --  211*  --   ALKPHOS  --   --  104  --   BILITOT  --   --  1.2  --   PROT  --   --  5.5*  --   ALBUMIN 2.9* 2.9* 2.8* 2.5*   No results for input(s): LIPASE, AMYLASE in the last 168 hours. No results for input(s): AMMONIA in the last 168 hours. Coagulation Profile: No results for input(s): INR, PROTIME in the last 168 hours. Cardiac Enzymes: No results for input(s): CKTOTAL, CKMB, CKMBINDEX, TROPONINI in the last 168 hours. BNP (last 3 results) No results for input(s): PROBNP in the last 8760 hours. HbA1C: No results for input(s): HGBA1C in the last 72 hours. CBG: Recent Labs  Lab 06/21/20 1022 06/21/20 1222 06/21/20 1632 06/21/20 2103 06/22/20 0729  GLUCAP 209* 166* 189* 193* 179*   Lipid Profile: No results for input(s): CHOL, HDL, LDLCALC, TRIG, CHOLHDL, LDLDIRECT in the last 72 hours. Thyroid Function Tests: No results for input(s): TSH, T4TOTAL, FREET4, T3FREE, THYROIDAB in the last 72 hours. Anemia Panel: No results for input(s): VITAMINB12, FOLATE, FERRITIN, TIBC, IRON, RETICCTPCT in the last 72 hours. Urine analysis:    Component Value Date/Time   COLORURINE YELLOW 06/18/2020 1536   APPEARANCEUR CLOUDY (A) 06/18/2020 1536   LABSPEC 1.015 06/18/2020 1536   PHURINE 5.0 06/18/2020 1536   GLUCOSEU 150 (A) 06/18/2020 1536   HGBUR NEGATIVE 06/18/2020 [place] 06/18/2020 1536   KETONESUR NEGATIVE 06/18/2020 1536   PROTEINUR >=300 (A) 06/18/2020 1536   UROBILINOGEN 0.2 10/26/2013 2229   NITRITE NEGATIVE 06/18/2020 1536   LEUKOCYTESUR NEGATIVE 06/18/2020 1536   Sepsis Labs: @LABRCNTIP (procalcitonin:4,lacticidven:4) ) Recent Results (from the past 240 hour(s))  Respiratory Panel by RT PCR (Flu A&B, Covid) - Nasopharyngeal Swab     Status: None   Collection Time: 06/12/20 10:57 PM   Specimen:  Nasopharyngeal Swab  Result Value Ref Range Status   SARS Coronavirus 2 by RT PCR NEGATIVE NEGATIVE Final    Comment: (NOTE) SARS-CoV-2 target nucleic acids are NOT DETECTED.  The SARS-CoV-2 RNA is generally detectable in upper respiratoy specimens during the acute phase of infection. The lowest concentration of SARS-CoV-2 viral copies this assay can detect is 131 copies/mL. A negative result does not preclude SARS-Cov-2 infection and should not be used as the sole basis for treatment or other patient management decisions. A negative result may occur with  improper specimen collection/handling, submission of specimen other than nasopharyngeal swab, presence of viral mutation(s) within the areas targeted by this assay, and inadequate number of viral copies (<131 copies/mL). A negative result must be combined with clinical observations, patient history, and epidemiological information. The expected result is Negative.  Fact Sheet for Patients:  PinkCheek.be  Fact Sheet for Healthcare Providers:  GravelBags.it  This test is no t yet approved or cleared by the [place] FDA and  has been authorized for detection and/or diagnosis of SARS-CoV-2 by FDA under an Emergency Use Authorization (EUA). This EUA will remain  in effect (meaning this test can be used) for the  duration of the COVID-19 declaration under Section 564(b)(1) of the Act, 21 U.S.C. section 360bbb-3(b)(1), unless the authorization is terminated or revoked sooner.     Influenza A by PCR NEGATIVE NEGATIVE Final   Influenza B by PCR NEGATIVE NEGATIVE Final    Comment: (NOTE) The Xpert Xpress SARS-CoV-2/FLU/RSV assay is intended as an aid in  the diagnosis of influenza from Nasopharyngeal swab specimens and  should not be used as a sole basis for treatment. Nasal washings and  aspirates are unacceptable for Xpert Xpress SARS-CoV-2/FLU/RSV  testing.  Fact Sheet  for Patients: PinkCheek.be  Fact Sheet for Healthcare Providers: GravelBags.it  This test is not yet approved or cleared by the [place] FDA and  has been authorized for detection and/or diagnosis of SARS-CoV-2 by  FDA under an Emergency Use Authorization (EUA). This EUA will remain  in effect (meaning this test can be used) for the duration of the  Covid-19 declaration under Section 564(b)(1) of the Act, 21  U.S.C. section 360bbb-3(b)(1), unless the authorization is  terminated or revoked. Performed at [facility], [address] [place], [place] [address]   MRSA PCR Screening     Status: None   Collection Time: 06/13/20  6:51 PM   Specimen: Nasopharyngeal  Result Value Ref Range Status   MRSA by PCR NEGATIVE NEGATIVE Final    Comment:        The GeneXpert MRSA Assay (FDA approved for NASAL specimens only), is one component of a comprehensive MRSA colonization surveillance program. It is not intended to diagnose MRSA infection nor to guide or monitor treatment for MRSA infections. Performed at [facility], [address], [place], [place] [address]   Respiratory Panel by RT PCR (Flu A&B, Covid) - Nasopharyngeal Swab     Status: None   Collection Time: 06/18/20  3:34 PM   Specimen: Nasopharyngeal Swab  Result Value Ref Range Status   SARS Coronavirus 2 by RT PCR NEGATIVE NEGATIVE Final    Comment: (NOTE) SARS-CoV-2 target nucleic acids are NOT DETECTED.  The SARS-CoV-2 RNA is generally detectable in upper respiratoy specimens during the acute phase of infection. The lowest concentration of SARS-CoV-2 viral copies this assay can detect is 131 copies/mL. A negative result does not preclude SARS-Cov-2 infection and should not be used as the sole basis for treatment or other patient management decisions. A negative result may occur with  improper specimen collection/handling, submission of specimen other than  nasopharyngeal swab, presence of viral mutation(s) within the areas targeted by this assay, and inadequate number of viral copies (<131 copies/mL). A negative result must be combined with clinical observations, patient history, and epidemiological information. The expected result is Negative.  Fact Sheet for Patients:  PinkCheek.be  Fact Sheet for Healthcare Providers:  GravelBags.it  This test is no t yet approved or cleared by the [place] FDA and  has been authorized for detection and/or diagnosis of SARS-CoV-2 by FDA under an Emergency Use Authorization (EUA). This EUA will remain  in effect (meaning this test can be used) for the duration of the COVID-19 declaration under Section 564(b)(1) of the Act, 21 U.S.C. section 360bbb-3(b)(1), unless the authorization is terminated or revoked sooner.     Influenza A by PCR NEGATIVE NEGATIVE Final   Influenza B by PCR NEGATIVE NEGATIVE Final    Comment: (NOTE) The Xpert Xpress SARS-CoV-2/FLU/RSV assay is intended as an aid in  the diagnosis of influenza from Nasopharyngeal swab specimens and  should not be used as a  sole basis for treatment. Nasal washings and  aspirates are unacceptable for Xpert Xpress SARS-CoV-2/FLU/RSV  testing.  Fact Sheet for Patients: PinkCheek.be  Fact Sheet for Healthcare Providers: GravelBags.it  This test is not yet approved or cleared by the [place] FDA and  has been authorized for detection and/or diagnosis of SARS-CoV-2 by  FDA under an Emergency Use Authorization (EUA). This EUA will remain  in effect (meaning this test can be used) for the duration of the  Covid-19 declaration under Section 564(b)(1) of the Act, 21  U.S.C. section 360bbb-3(b)(1), unless the authorization is  terminated or revoked. Performed at [facility], [address] [place], [place] [address]           Radiology Studies: CARDIAC CATHETERIZATION  Result Date: 06/22/2020  Occlusion of the sequential saphenous vein graft to the obtuse marginal and PDA (left).  Patent saphenous vein graft to the mid RCA.  Patent ostial to proximal saphenous vein graft stent.  Patent stent in the mid RCA beyond the graft insertion site  Patent LIMA to mid LAD.  Ostial LIMA stent has 50 to 70% ISR.  Cath engagement causes damping of pressure.  Totally occluded proximal RCA  Functionally occluded left main with very faint opacification of the circumflex system despite the absence of a clear channel.  99% stenosis of the ostial to proximal circumflex stent.  Total occlusion of the proximal LAD.  Normal left ventricular end-diastolic pressure. RECOMMENDATIONS:  Severe native and bypass graft disease.  No obvious target for PCI this time.  Revascularization strategy would likely include repeat coronary bypass surgery when left internal mammary ostial stent progresses.  IR Fluoro Guide CV Line Right  Result Date: 06/21/2020 INDICATION: End-stage renal disease. In need of durable access for the initiation of hemodialysis. EXAM: TUNNELED CENTRAL VENOUS HEMODIALYSIS CATHETER PLACEMENT WITH ULTRASOUND AND FLUOROSCOPIC GUIDANCE MEDICATIONS: Vancomycin 1 gm IV . The antibiotic was given in an appropriate time interval prior to skin puncture. ANESTHESIA/SEDATION: Moderate (conscious) sedation was employed during this procedure. A total of Versed 1.5 mg and Fentanyl 550 mcg was administered intravenously. Moderate Sedation Time: 10 minutes. The patient's level of consciousness and vital signs were monitored continuously by radiology nursing throughout the procedure under my direct supervision. FLUOROSCOPY TIME:  18 seconds (5 mGy) COMPLICATIONS: None immediate. PROCEDURE: Informed written consent was obtained from the patient after a discussion of the risks, benefits, and alternatives to treatment. Questions regarding the  procedure were encouraged and answered. The right neck and chest were prepped with chlorhexidine in a sterile fashion, and a sterile drape was applied covering the operative field. Maximum barrier sterile technique with sterile gowns and gloves were used for the procedure. A timeout was performed prior to the initiation of the procedure. After creating a small venotomy incision, a micropuncture kit was utilized to access the internal jugular vein. Real-time ultrasound guidance was utilized for vascular access including the acquisition of a permanent ultrasound image documenting patency of the accessed vessel. The microwire was utilized to measure appropriate catheter length. A stiff Glidewire was advanced to the level of the IVC and the micropuncture sheath was exchanged for a peel-away sheath. A palindrome tunneled hemodialysis catheter measuring 23 cm from tip to cuff was tunneled in a retrograde fashion from the anterior chest wall to the venotomy incision. The catheter was then placed through the peel-away sheath with tips ultimately positioned within the superior aspect of the right atrium. Final catheter positioning was confirmed and documented with a spot radiographic  image. The catheter aspirates and flushes normally. The catheter was flushed with appropriate volume heparin dwells. The catheter exit site was secured with a 0-Prolene retention suture. The venotomy incision was closed with an interrupted 4-0 Vicryl, Dermabond and Steri-strips. Dressings were applied. The patient tolerated the procedure well without immediate post procedural complication. IMPRESSION: Successful placement of 23 cm tip to cuff tunneled hemodialysis catheter via the right internal jugular vein with tips terminating within the superior aspect of the right atrium. The catheter is ready for immediate use. Electronically Signed   By: Sandi Mariscal M.D.   On: 06/21/2020 09:39   IR US Guide Vasc Access Right  Result Date:  06/21/2020 INDICATION: End-stage renal disease. In need of durable access for the initiation of hemodialysis. EXAM: TUNNELED CENTRAL VENOUS HEMODIALYSIS CATHETER PLACEMENT WITH ULTRASOUND AND FLUOROSCOPIC GUIDANCE MEDICATIONS: Vancomycin 1 gm IV . The antibiotic was given in an appropriate time interval prior to skin puncture. ANESTHESIA/SEDATION: Moderate (conscious) sedation was employed during this procedure. A total of Versed 1.5 mg and Fentanyl 550 mcg was administered intravenously. Moderate Sedation Time: 10 minutes. The patient's level of consciousness and vital signs were monitored continuously by radiology nursing throughout the procedure under my direct supervision. FLUOROSCOPY TIME:  18 seconds (5 mGy) COMPLICATIONS: None immediate. PROCEDURE: Informed written consent was obtained from the patient after a discussion of the risks, benefits, and alternatives to treatment. Questions regarding the procedure were encouraged and answered. The right neck and chest were prepped with chlorhexidine in a sterile fashion, and a sterile drape was applied covering the operative field. Maximum barrier sterile technique with sterile gowns and gloves were used for the procedure. A timeout was performed prior to the initiation of the procedure. After creating a small venotomy incision, a micropuncture kit was utilized to access the internal jugular vein. Real-time ultrasound guidance was utilized for vascular access including the acquisition of a permanent ultrasound image documenting patency of the accessed vessel. The microwire was utilized to measure appropriate catheter length. A stiff Glidewire was advanced to the level of the IVC and the micropuncture sheath was exchanged for a peel-away sheath. A palindrome tunneled hemodialysis catheter measuring 23 cm from tip to cuff was tunneled in a retrograde fashion from the anterior chest wall to the venotomy incision. The catheter was then placed through the peel-away  sheath with tips ultimately positioned within the superior aspect of the right atrium. Final catheter positioning was confirmed and documented with a spot radiographic image. The catheter aspirates and flushes normally. The catheter was flushed with appropriate volume heparin dwells. The catheter exit site was secured with a 0-Prolene retention suture. The venotomy incision was closed with an interrupted 4-0 Vicryl, Dermabond and Steri-strips. Dressings were applied. The patient tolerated the procedure well without immediate post procedural complication. IMPRESSION: Successful placement of 23 cm tip to cuff tunneled hemodialysis catheter via the right internal jugular vein with tips terminating within the superior aspect of the right atrium. The catheter is ready for immediate use. Electronically Signed   By: Sandi Mariscal M.D.   On: 06/21/2020 09:39   VAS [place] UPPER EXT VEIN MAPPING (PRE-OP AVF)  Result Date: 06/22/2020 UPPER EXTREMITY VEIN MAPPING  Indications: Pre-access. Comparison Study: No prior studies. Performing Technologist: Oliver Hum RVT  Examination Guidelines: A complete evaluation includes B-mode imaging, spectral Doppler, color Doppler, and power Doppler as needed of all accessible portions of each vessel. Bilateral testing is considered an integral part of a complete examination. Limited examinations for reoccurring  indications may be performed as noted. +-----------------+-------------+----------+---------+ Right Cephalic   Diameter (cm)Depth (cm)Findings  +-----------------+-------------+----------+---------+ Shoulder             0.43        2.00             +-----------------+-------------+----------+---------+ Prox upper arm       0.29        2.10             +-----------------+-------------+----------+---------+ Mid upper arm        0.25        1.30             +-----------------+-------------+----------+---------+ Dist upper arm       0.33        0.94              +-----------------+-------------+----------+---------+ Antecubital fossa    0.35        0.55   branching +-----------------+-------------+----------+---------+ Prox forearm         0.22        0.89             +-----------------+-------------+----------+---------+ Mid forearm          0.22        0.64             +-----------------+-------------+----------+---------+ Dist forearm         0.15        0.47             +-----------------+-------------+----------+---------+ +-----------------+-------------+----------+--------------+ Right Basilic    Diameter (cm)Depth (cm)   Findings    +-----------------+-------------+----------+--------------+ Shoulder             0.47        1.34                  +-----------------+-------------+----------+--------------+ Mid upper arm        0.29        1.50                  +-----------------+-------------+----------+--------------+ Dist upper arm       0.18        1.21                  +-----------------+-------------+----------+--------------+ Antecubital fossa    0.16        1.30     branching    +-----------------+-------------+----------+--------------+ Prox forearm         0.11        0.51                  +-----------------+-------------+----------+--------------+ Mid forearm                             not visualized +-----------------+-------------+----------+--------------+ Distal forearm                          not visualized +-----------------+-------------+----------+--------------+ +-----------------+-------------+----------+----------------+ Left Cephalic    Diameter (cm)Depth (cm)    Findings     +-----------------+-------------+----------+----------------+ Shoulder             0.40        1.70                    +-----------------+-------------+----------+----------------+ Prox upper arm       0.55        1.44                    +-----------------+-------------+----------+----------------+  Mid upper arm        0.34        1.10      branching     +-----------------+-------------+----------+----------------+ Dist upper arm       0.37        0.68                    +-----------------+-------------+----------+----------------+ Antecubital fossa    0.47        0.32   Partial thrombus +-----------------+-------------+----------+----------------+ Prox forearm         0.38        0.74                    +-----------------+-------------+----------+----------------+ Mid forearm          0.46        0.44                    +-----------------+-------------+----------+----------------+ Dist forearm         0.45        0.39   Partial thrombus +-----------------+-------------+----------+----------------+ +-----------------+-------------+----------+--------------+ Left Basilic     Diameter (cm)Depth (cm)   Findings    +-----------------+-------------+----------+--------------+ Shoulder             0.37        1.80                  +-----------------+-------------+----------+--------------+ Mid upper arm        0.42        1.40                  +-----------------+-------------+----------+--------------+ Dist upper arm       0.43        1.50                  +-----------------+-------------+----------+--------------+ Antecubital fossa    0.39        0.93                  +-----------------+-------------+----------+--------------+ Prox forearm         0.10        0.31                  +-----------------+-------------+----------+--------------+ Mid forearm                             not visualized +-----------------+-------------+----------+--------------+ Distal forearm                          not visualized +-----------------+-------------+----------+--------------+ *See table(s) above for measurements and observations.   Diagnosing physician: Servando Snare MD Electronically signed by Servando Snare MD on 06/22/2020 at 12:18:51 PM.    Final        Scheduled Meds: . ALPRAZolam  0.25 mg Oral BID  . ALPRAZolam  0.5 mg Oral QHS  . aspirin EC  81 mg Oral Daily  . atorvastatin  80 mg Oral QPM  . Chlorhexidine Gluconate Cloth  6 each Topical Q0600  . Chlorhexidine Gluconate Cloth  6 each Topical Q0600  . clopidogrel  75 mg Oral Daily  . feeding supplement (ENSURE ENLIVE)  237 mL Oral BID BM  . ferrous sulfate  325 mg Oral Q lunch  . heparin  5,000 Units Subcutaneous Q8H  . insulin aspart  0-5 Units Subcutaneous QHS  . insulin aspart  0-9 Units Subcutaneous TID WC  .  insulin glargine  10 Units Subcutaneous Daily  . levothyroxine  125 mcg Oral Daily  . multivitamin  1 tablet Oral QHS  . pantoprazole  40 mg Oral BID  . sodium bicarbonate  650 mg Oral BID  . sodium chloride flush  3 mL Intravenous Q12H  . sodium chloride flush  3 mL Intravenous Q12H  . torsemide  100 mg Oral Daily  . Vitamin D (Ergocalciferol)  50,000 Units Oral Q Mon   Continuous Infusions: . sodium chloride    . calcium chloride  IV    . nitroGLYCERIN 10 mcg/min (06/22/20 1307)     LOS: 4 days      Debbe Odea, MD [facility] Pager: [email or web address] 06/22/2020, 5:07 PM

## 2020-06-22 NOTE — CV Procedure (Addendum)
   99% left main in-stent restenosis with TIMI grade II flow into the circumflex and obtuse marginal.  Totally occluded proximal LAD  Totally occluded mid RCA.  RCA was a codominant vessel.  Saphenous vein graft ramus intermedius and left PDA totally occluded  Saphenous vein graft to distal RCA patent.  Ostial and mid native RCA stents are patent.  LIMA to LAD patent but with 50 to 60% in-stent restenosis in the proximal to mid stent.  Catheter damping with selective engagement.  Left ventriculography not performed.  LVEDP 7 mmHg.  Total contrast 160 cc.  We will likely develop acute worsening in kidney function and need dialysis.  Ultimately, coronary disease therapy will need to be redo CABG.

## 2020-06-22 NOTE — Progress Notes (Signed)
Progress Note  Patient Name: Donna Howe Date of Encounter: 06/22/2020  Primary Cardiologist: Rozann Lesches, MD   Subjective   Tired this morning after a late HD session last night. Had some chest discomfort immediately after HD catheter placement but none this morning. Breathing is stable and LE edema is at baseline. She is anticipating a cardiac cath this morning.   Inpatient Medications    Scheduled Meds: . ALPRAZolam  0.25 mg Oral BID  . ALPRAZolam  0.5 mg Oral QHS  . aspirin EC  81 mg Oral Daily  . atorvastatin  80 mg Oral QPM  . Chlorhexidine Gluconate Cloth  6 each Topical Q0600  . Chlorhexidine Gluconate Cloth  6 each Topical Q0600  . clopidogrel  75 mg Oral Daily  . ferrous sulfate  325 mg Oral Q lunch  . heparin  5,000 Units Subcutaneous Q8H  . insulin aspart  0-5 Units Subcutaneous QHS  . insulin aspart  0-9 Units Subcutaneous TID WC  . insulin glargine  10 Units Subcutaneous Daily  . levothyroxine  125 mcg Oral Daily  . pantoprazole  40 mg Oral BID  . sodium bicarbonate  650 mg Oral BID  . sodium chloride flush  3 mL Intravenous Q12H  . torsemide  100 mg Oral Daily  . Vitamin D (Ergocalciferol)  50,000 Units Oral Q Mon   Continuous Infusions: . sodium chloride    . sodium chloride    . calcium chloride  IV    . nitroGLYCERIN 5 mcg/min (06/19/20 0936)   PRN Meds: sodium chloride, acetaminophen **OR** acetaminophen, albuterol, morphine injection, nitroGLYCERIN, ondansetron (ZOFRAN) IV, sodium chloride flush   Vital Signs    Vitals:   06/22/20 0330 06/22/20 0400 06/22/20 0413 06/22/20 0422  BP: (!) 142/61  (!) 136/58   Pulse: 65 71 72   Resp: 18  18   Temp: 98 F (36.7 C)  97.7 F (36.5 C)   TempSrc: Oral  Axillary   SpO2: 98% 100% 99%   Weight: 104.1 kg   101.5 kg  Height:        Intake/Output Summary (Last 24 hours) at 06/22/2020 0756 Last data filed at 06/22/2020 0422 Gross per 24 hour  Intake 1175.53 ml  Output 2100 ml  Net -924.47  ml   Filed Weights   06/22/20 0124 06/22/20 0330 06/22/20 0422  Weight: 104.1 kg 104.1 kg 101.5 kg    Telemetry    Sinus rhythm with occasional PVCs and 1 brief pause noted - Personally Reviewed  ECG    No new tracings - Personally Reviewed  Physical Exam   GEN: No acute distress.   Neck: No JVD, no carotid bruits Cardiac: RRR, no murmurs, rubs, or gallops.  Respiratory: Clear to auscultation bilaterally, no wheezes/ rales/ rhonchi GI: NABS, Soft, obese, nontender, non-distended  MS: 1-2+ LE edema; No deformity. Neuro:  Nonfocal, moving all extremities spontaneously Psych: Normal affect   Labs    Chemistry Recent Labs  Lab 06/17/20 0551 06/18/20 1543 06/19/20 0236 06/19/20 0236 06/19/20 1038 06/20/20 0251 06/21/20 1043  NA 137   < > 135  --   --  137 138  K 5.3*   < > 5.7*   < > 4.7 4.5 3.4*  CL 106   < > 100  --   --  100 96*  CO2 24   < > 21*  --   --  25 27  GLUCOSE 118*   < > 220*  --   --  138* 221*  BUN 91*   < > 110*  --   --  106* 103*  CREATININE 4.43*   < > 5.68*  --   --  5.60* 5.72*  CALCIUM 8.4*   < > 8.8*  --   --  8.3* 8.1*  PROT  --   --  5.5*  --   --   --   --   ALBUMIN 2.9*  --  2.8*  --   --  2.5*  --   AST  --   --  129*  --   --   --   --   ALT  --   --  211*  --   --   --   --   ALKPHOS  --   --  104  --   --   --   --   BILITOT  --   --  1.2  --   --   --   --   GFRNONAA 10*   < > 7*  --   --  8* 7*  GFRAA 12*   < > 9*  --   --  9* 9*  ANIONGAP 7   < > 14  --   --  12 15   < > = values in this interval not displayed.     Hematology Recent Labs  Lab 06/19/20 0236 06/20/20 0929 06/21/20 1043  WBC 13.3* 9.0 8.0  RBC 2.64* 2.52* 2.59*  HGB 7.7* 7.4* 7.8*  HCT 24.0* 23.2* 23.9*  MCV 90.9 92.1 92.3  MCH 29.2 29.4 30.1  MCHC 32.1 31.9 32.6  RDW 15.1 15.6* 15.3  PLT 90* 94* 106*    Cardiac EnzymesNo results for input(s): TROPONINI in the last 168 hours. No results for input(s): TROPIPOC in the last 168 hours.   BNP Recent  Labs  Lab 06/18/20 1549  BNP 2,129.0*     DDimer No results for input(s): DDIMER in the last 168 hours.   Radiology    IR Fluoro Guide CV Line Right  Result Date: 06/21/2020 INDICATION: End-stage renal disease. In need of durable access for the initiation of hemodialysis. EXAM: TUNNELED CENTRAL VENOUS HEMODIALYSIS CATHETER PLACEMENT WITH ULTRASOUND AND FLUOROSCOPIC GUIDANCE MEDICATIONS: Vancomycin 1 gm IV . The antibiotic was given in an appropriate time interval prior to skin puncture. ANESTHESIA/SEDATION: Moderate (conscious) sedation was employed during this procedure. A total of Versed 1.5 mg and Fentanyl 550 mcg was administered intravenously. Moderate Sedation Time: 10 minutes. The patient's level of consciousness and vital signs were monitored continuously by radiology nursing throughout the procedure under my direct supervision. FLUOROSCOPY TIME:  18 seconds (5 mGy) COMPLICATIONS: None immediate. PROCEDURE: Informed written consent was obtained from the patient after a discussion of the risks, benefits, and alternatives to treatment. Questions regarding the procedure were encouraged and answered. The right neck and chest were prepped with chlorhexidine in a sterile fashion, and a sterile drape was applied covering the operative field. Maximum barrier sterile technique with sterile gowns and gloves were used for the procedure. A timeout was performed prior to the initiation of the procedure. After creating a small venotomy incision, a micropuncture kit was utilized to access the internal jugular vein. Real-time ultrasound guidance was utilized for vascular access including the acquisition of a permanent ultrasound image documenting patency of the accessed vessel. The microwire was utilized to measure appropriate catheter length. A stiff Glidewire was advanced to the level of the IVC and the micropuncture  sheath was exchanged for a peel-away sheath. A palindrome tunneled hemodialysis catheter  measuring 23 cm from tip to cuff was tunneled in a retrograde fashion from the anterior chest wall to the venotomy incision. The catheter was then placed through the peel-away sheath with tips ultimately positioned within the superior aspect of the right atrium. Final catheter positioning was confirmed and documented with a spot radiographic image. The catheter aspirates and flushes normally. The catheter was flushed with appropriate volume heparin dwells. The catheter exit site was secured with a 0-Prolene retention suture. The venotomy incision was closed with an interrupted 4-0 Vicryl, Dermabond and Steri-strips. Dressings were applied. The patient tolerated the procedure well without immediate post procedural complication. IMPRESSION: Successful placement of 23 cm tip to cuff tunneled hemodialysis catheter via the right internal jugular vein with tips terminating within the superior aspect of the right atrium. The catheter is ready for immediate use. Electronically Signed   By: Sandi Mariscal M.D.   On: 06/21/2020 09:39   IR US Guide Vasc Access Right  Result Date: 06/21/2020 INDICATION: End-stage renal disease. In need of durable access for the initiation of hemodialysis. EXAM: TUNNELED CENTRAL VENOUS HEMODIALYSIS CATHETER PLACEMENT WITH ULTRASOUND AND FLUOROSCOPIC GUIDANCE MEDICATIONS: Vancomycin 1 gm IV . The antibiotic was given in an appropriate time interval prior to skin puncture. ANESTHESIA/SEDATION: Moderate (conscious) sedation was employed during this procedure. A total of Versed 1.5 mg and Fentanyl 550 mcg was administered intravenously. Moderate Sedation Time: 10 minutes. The patient's level of consciousness and vital signs were monitored continuously by radiology nursing throughout the procedure under my direct supervision. FLUOROSCOPY TIME:  18 seconds (5 mGy) COMPLICATIONS: None immediate. PROCEDURE: Informed written consent was obtained from the patient after a discussion of the risks,  benefits, and alternatives to treatment. Questions regarding the procedure were encouraged and answered. The right neck and chest were prepped with chlorhexidine in a sterile fashion, and a sterile drape was applied covering the operative field. Maximum barrier sterile technique with sterile gowns and gloves were used for the procedure. A timeout was performed prior to the initiation of the procedure. After creating a small venotomy incision, a micropuncture kit was utilized to access the internal jugular vein. Real-time ultrasound guidance was utilized for vascular access including the acquisition of a permanent ultrasound image documenting patency of the accessed vessel. The microwire was utilized to measure appropriate catheter length. A stiff Glidewire was advanced to the level of the IVC and the micropuncture sheath was exchanged for a peel-away sheath. A palindrome tunneled hemodialysis catheter measuring 23 cm from tip to cuff was tunneled in a retrograde fashion from the anterior chest wall to the venotomy incision. The catheter was then placed through the peel-away sheath with tips ultimately positioned within the superior aspect of the right atrium. Final catheter positioning was confirmed and documented with a spot radiographic image. The catheter aspirates and flushes normally. The catheter was flushed with appropriate volume heparin dwells. The catheter exit site was secured with a 0-Prolene retention suture. The venotomy incision was closed with an interrupted 4-0 Vicryl, Dermabond and Steri-strips. Dressings were applied. The patient tolerated the procedure well without immediate post procedural complication. IMPRESSION: Successful placement of 23 cm tip to cuff tunneled hemodialysis catheter via the right internal jugular vein with tips terminating within the superior aspect of the right atrium. The catheter is ready for immediate use. Electronically Signed   By: Sandi Mariscal M.D.   On: 06/21/2020  09:39   VAS [place]  UPPER EXT VEIN MAPPING (PRE-OP AVF)  Result Date: 06/21/2020 UPPER EXTREMITY VEIN MAPPING  Indications: Pre-access. Comparison Study: No prior studies. Performing Technologist: Oliver Hum RVT  Examination Guidelines: A complete evaluation includes B-mode imaging, spectral Doppler, color Doppler, and power Doppler as needed of all accessible portions of each vessel. Bilateral testing is considered an integral part of a complete examination. Limited examinations for reoccurring indications may be performed as noted. +-----------------+-------------+----------+---------+ Right Cephalic   Diameter (cm)Depth (cm)Findings  +-----------------+-------------+----------+---------+ Shoulder             0.43        2.00             +-----------------+-------------+----------+---------+ Prox upper arm       0.29        2.10             +-----------------+-------------+----------+---------+ Mid upper arm        0.25        1.30             +-----------------+-------------+----------+---------+ Dist upper arm       0.33        0.94             +-----------------+-------------+----------+---------+ Antecubital fossa    0.35        0.55   branching +-----------------+-------------+----------+---------+ Prox forearm         0.22        0.89             +-----------------+-------------+----------+---------+ Mid forearm          0.22        0.64             +-----------------+-------------+----------+---------+ Dist forearm         0.15        0.47             +-----------------+-------------+----------+---------+ +-----------------+-------------+----------+--------------+ Right Basilic    Diameter (cm)Depth (cm)   Findings    +-----------------+-------------+----------+--------------+ Shoulder             0.47        1.34                  +-----------------+-------------+----------+--------------+ Mid upper arm        0.29        1.50                   +-----------------+-------------+----------+--------------+ Dist upper arm       0.18        1.21                  +-----------------+-------------+----------+--------------+ Antecubital fossa    0.16        1.30     branching    +-----------------+-------------+----------+--------------+ Prox forearm         0.11        0.51                  +-----------------+-------------+----------+--------------+ Mid forearm                             not visualized +-----------------+-------------+----------+--------------+ Distal forearm                          not visualized +-----------------+-------------+----------+--------------+ +-----------------+-------------+----------+----------------+ Left Cephalic    Diameter (cm)Depth (cm)    Findings     +-----------------+-------------+----------+----------------+ Shoulder  0.40        1.70                    +-----------------+-------------+----------+----------------+ Prox upper arm       0.55        1.44                    +-----------------+-------------+----------+----------------+ Mid upper arm        0.34        1.10      branching     +-----------------+-------------+----------+----------------+ Dist upper arm       0.37        0.68                    +-----------------+-------------+----------+----------------+ Antecubital fossa    0.47        0.32   Partial thrombus +-----------------+-------------+----------+----------------+ Prox forearm         0.38        0.74                    +-----------------+-------------+----------+----------------+ Mid forearm          0.46        0.44                    +-----------------+-------------+----------+----------------+ Dist forearm         0.45        0.39   Partial thrombus +-----------------+-------------+----------+----------------+ +-----------------+-------------+----------+--------------+ Left Basilic     Diameter (cm)Depth (cm)    Findings    +-----------------+-------------+----------+--------------+ Shoulder             0.37        1.80                  +-----------------+-------------+----------+--------------+ Mid upper arm        0.42        1.40                  +-----------------+-------------+----------+--------------+ Dist upper arm       0.43        1.50                  +-----------------+-------------+----------+--------------+ Antecubital fossa    0.39        0.93                  +-----------------+-------------+----------+--------------+ Prox forearm         0.10        0.31                  +-----------------+-------------+----------+--------------+ Mid forearm                             not visualized +-----------------+-------------+----------+--------------+ Distal forearm                          not visualized +-----------------+-------------+----------+--------------+ *See table(s) above for measurements and observations.   Diagnosing physician:    Preliminary     Cardiac Studies   Echo 06/19/2020 1. Left ventricular ejection fraction, by estimation, is 55 to 60%. The  left ventricle has normal function. The left ventricle has no regional  wall motion abnormalities.  2. The mitral valve is normal in structure. Mild to moderate mitral valve  regurgitation. No evidence of mitral stenosis.  3. Limited echocardiogram to reevalaute  LV function and wall motion.   Patient Profile     64 y.o. female with hx of CAD s/p CABG in 2005 and multiple PCI's afterwards, HTN, DM, HLD, CKD stage IV presented 06/18/20 (one day after discharge) with weakness and confusion. Found to have bradycardia in 40s and hyperkalemia at 6.7 with Scr of 5.18.  PTCA to SVG-OM1 in 01/2004,PTCA/DES to LM and prox LCxin 03/2004 and DES to SVG-OM1, PTCA/DES to SVG-OMin 01/2011,PTCA/DES to LM and LCxin 2015, andcutting balloon angioplasty to prox Cx and DES to SVG-RCAin 05/2017, NSTEMI in 03/2018  with DES to mid-RCA and DES to LIMA-LAD.  Most recently admitted to [facility] end of 05/2020 with NSTEMI. Hs troponin peaked at 160 at that time. Cath deferred due to advanced CKD. Treated with heparin for 48 hours. Remained on DAPT with ASA and plavix. Started on Ranexa.   Assessment & Plan    1. NSTEMI in patient with CAD s/p CABG in 2005 with subsequent PCI/DES to SVG-RCA (2018) and LIMA-LAD (2019): HsTrop peaked at 970 and trended down this admission. She had recurrent chest pressure which was relieved with a nitro gtt. Echocardiogram with EF 55-60%, no RWMA. LHC delayed due to progression of her CKD. Now s/p 1st session of HD with 2nd planned today following LHC. Heparin gtt deferred due to acute on chronic anemia/thrombocytopenia. BBlocker held due to bradycardia on admit - Await LHC today.  - Continue ASA, plavix, and statin - Continue nitro gtt for now - Continue ranexa  2. Bradycardia: resolved with discontinuation of her BBlocker - Continue to monitor on telemetry  3. CKD stage 5: now with ESRD. S/p HD tunneled catheter placement 06/21/20 and completed her first session of HD - Continue HD per nephrology - anticipate 2nd session following LHC today  4. Anemia/thrombocytopenia: Hgb 8.7>9.2>7.7>7.4>7.8 and PLT 101>88>90>94>106 this admission. Repeat pending this morning - Transfuse per primary team to maintain Hgb >8 given extensive CAD history - Continue aspirin and plavix for now  5. HLD: LDL 67 06/13/20 - Continue statin      For questions or updates, please contact [place] Please consult [email or web address] for contact info under Cardiology/STEMI.      Signed, Abigail Butts, PA-C  06/22/2020, 7:56 AM   [phone]

## 2020-06-22 NOTE — Progress Notes (Signed)
Initial Nutrition Assessment  DOCUMENTATION CODES:   Obesity unspecified  INTERVENTION:   Add Renal MVI daily  Follow-up and provide diet education  Ensure Enlive po BID, each supplement provides 350 kcal and 20 grams of protein   NUTRITION DIAGNOSIS:   Inadequate oral intake related to nausea, decreased appetite as evidenced by per patient/family report.  GOAL:   Patient will meet greater than or equal to 90% of their needs  MONITOR:   PO intake, Supplement acceptance, Labs, Weight trends  REASON FOR ASSESSMENT:   Consult Other (Comment), Assessment of nutrition requirement/status (new start HD)  ASSESSMENT:   64 yo female admitted with NSTEMI,  AKI on CKD with uremic symptoms and progression to ESRD and initiation of HD this admission. PMH includes CHF, CKD, DM, HTN, HLD, CAD/CABG   10/04 Tunneled HD cath with initiation of 1st HD session  Noted plan for 2nd HD session today. NPO and out of room at time of visit today for procedure  Noted pt seen by Dialysis RN Coordinator yesterday and provided Kidney Failure Book  +nausea and reports poor appetite. Recorded po intake 50-100% of meals; 75% on average  Post HD weight of 101.5 kg; admit weight 106.5 kg. Net negaitve 7 L per flow sheet  Follow phosphorus and potassium trend  Labs: potassium 3.2 (L), CBGs 166-209 Meds: ferrous sulfate, ss novolog, lantus, KCl, sodium bicarb tabs, ergocalciferol  Diet Order:   Diet Order            Diet Carb Modified Fluid consistency: Thin; Room service appropriate? Yes  Diet effective now                 EDUCATION NEEDS:   Education needs have been addressed  Skin:  Skin Assessment: Reviewed RN Assessment  Last BM:  10/2  Height:   Ht Readings from Last 1 Encounters:  06/19/20 5' 5.98" (1.676 m)    Weight:   Wt Readings from Last 1 Encounters:  06/22/20 101.5 kg    BMI:  Body mass index is 36.12 kg/m.  Estimated Nutritional Needs:   Kcal:   2000-2200 kcals  Protein:  100-115 g  Fluid:  1000 mL plus UOP   Kerman Passey MS, RDN, LDN, CNSC Registered Dietitian III Clinical Nutrition RD Pager and On-Call Pager Number Located in [place]

## 2020-06-22 NOTE — Progress Notes (Signed)
Received pt back from [facility].  C/O left shoulder pain 10/10 radiating to left arm and chest.  Dr. Tamala Julian was notified by cath lab nurse.  Nitro SL was given x1 with relief and EKG was obtained.  Dr. Julieanne Manson was notified.  No change in EKG per Dr. Debara Pickett. Received order to titrate Nitro drip and monitor her.  Idolina Primer, RN

## 2020-06-22 NOTE — Consult Note (Signed)
   [facility] Inpatient Consult   06/22/2020  [name] 1956/08/31 [id number]   [facility] Patient:  Donna Howe  Noted patient is in a Pending status with [facility] Care Management.  Medical record reviewed and patient had an EMMI call outreach as patient was receiving EMMI calls will in the hospital. Came by room to follow on pending status, however patient was in patient care.  Patient has an extreme high risk score for unplanned readmission with less than 7 days readmission  Hospitalization noted.  Medical record reviewed to check if potential [place] Management service needs.   Primary Care Provider is  [facility] this provider is listed to provide the transition of care [TOC] for post hospital follow up.  Plan:  Continue to follow progress and disposition to assess for post hospital care management needs.    For questions contact:   Natividad Brood, RN BSN [facility] Liaison  [phone] business mobile phone Toll free office [phone]  Fax number: [phone] [email or web address]

## 2020-06-22 NOTE — Progress Notes (Signed)
Renal Navigator met with patient at bedside to discuss OP HD referral as requested by Dr. Arlyss Gandy. Patient was pleasant and welcoming of visit, though seems a little down about her situation and states that she is finding it hard to keep track of everything. She also wonders how much longer she will have to be in the hospital. Navigator informed her that her OP HD plan will be given to her in writing with hopes that this will help her feel more organized and validated her feelings. Navigator explained the referral process and that Navigator's understanding is that she has some more tests to be completed before we can determine when she will be ready for discharge. The OP HD referral usually only takes about a day, especially since we will not have to await financial clearance in her case. Navigator discussed clinic options with patient, explaining that the clinic in [place] appears to be the closest to her home. She states that she follows with Dr. Joelyn Oms at [facility], and wants to stay with this MD practice. Navigator explained that [facility] does not follow in the [place] clinic, so the closest clinic to her in this case is [facility] in [place], which is about 20 minutes farther away. Patient stated understanding and would like to be referred to [facility]. Navigator asked patient's plans for permanent access, as the Medical Director at [facility] requires permanent access for admission. She reports that she missed her appointment due to hospitalization, but plans to reschedule. She is open to having this surgery done in the hospital if MDs agree. Navigator notes Dr. Caprice Red documentation that states: "hold on permanent access in setting of NSTEMI but hopefully will be able to place prior to d/c.  Vein mapping ordered."  Navigator completed referral to [facility] to request treatment for ESRD at [facility] and will follow closely.  Alphonzo Cruise, [place] Renal  Navigator [phone]

## 2020-06-22 NOTE — Interval H&P Note (Signed)
Cath Lab Visit (complete for each Cath Lab visit)  Clinical Evaluation Leading to the Procedure:   ACS: Yes.    Non-ACS:    Anginal Classification: CCS III  Anti-ischemic medical therapy: Minimal Therapy (1 class of medications)  Non-Invasive Test Results: No non-invasive testing performed  Prior CABG: Previous CABG      History and Physical Interval Note:  06/22/2020 11:10 AM  Donna Howe  has presented today for surgery, with the diagnosis of non stemi.  The various methods of treatment have been discussed with the patient and family. After consideration of risks, benefits and other options for treatment, the patient has consented to  Procedure(s): LEFT HEART CATH AND CORS/GRAFTS ANGIOGRAPHY (N/A) as a surgical intervention.  The patient's history has been reviewed, patient examined, no change in status, stable for surgery.  I have reviewed the patient's chart and labs.  Questions were answered to the patient's satisfaction.     Belva Crome III

## 2020-06-22 NOTE — Progress Notes (Signed)
   Called about post-diagnostic cath chest pain - personally reviewed her cath images and EKG - no acute EKG changes. On IV nitro -recommended titrating up as bp tolerates. Cath with significant CAD progression, not amenable to PCI - will need evaluation for possible re-do CABG.  Pixie Casino, MD, [facility] [place] Director of the Advanced Lipid Disorders &  Cardiovascular Risk Reduction Clinic Diplomate of the American Board of Clinical Lipidology Attending Cardiologist  Direct Dial: [phone]  Fax: [phone]  Website:  [email or web address]

## 2020-06-23 DIAGNOSIS — N186 End stage renal disease: Secondary | ICD-10-CM | POA: Diagnosis not present

## 2020-06-23 DIAGNOSIS — I25110 Atherosclerotic heart disease of native coronary artery with unstable angina pectoris: Secondary | ICD-10-CM

## 2020-06-23 DIAGNOSIS — I6523 Occlusion and stenosis of bilateral carotid arteries: Secondary | ICD-10-CM

## 2020-06-23 DIAGNOSIS — R001 Bradycardia, unspecified: Secondary | ICD-10-CM | POA: Diagnosis not present

## 2020-06-23 DIAGNOSIS — I25700 Atherosclerosis of coronary artery bypass graft(s), unspecified, with unstable angina pectoris: Secondary | ICD-10-CM | POA: Diagnosis not present

## 2020-06-23 DIAGNOSIS — I70219 Atherosclerosis of native arteries of extremities with intermittent claudication, unspecified extremity: Secondary | ICD-10-CM

## 2020-06-23 DIAGNOSIS — I214 Non-ST elevation (NSTEMI) myocardial infarction: Secondary | ICD-10-CM | POA: Diagnosis not present

## 2020-06-23 LAB — CBC
HCT: 22.6 % — ABNORMAL LOW (ref 36.0–46.0)
Hemoglobin: 7.1 g/dL — ABNORMAL LOW (ref 12.0–15.0)
MCH: 29.1 pg (ref 26.0–34.0)
MCHC: 31.4 g/dL (ref 30.0–36.0)
MCV: 92.6 fL (ref 80.0–100.0)
Platelets: 118 10*3/uL — ABNORMAL LOW (ref 150–400)
RBC: 2.44 MIL/uL — ABNORMAL LOW (ref 3.87–5.11)
RDW: 15.5 % (ref 11.5–15.5)
WBC: 8.6 10*3/uL (ref 4.0–10.5)
nRBC: 0 % (ref 0.0–0.2)

## 2020-06-23 LAB — BASIC METABOLIC PANEL
Anion gap: 12 (ref 5–15)
BUN: 58 mg/dL — ABNORMAL HIGH (ref 8–23)
CO2: 29 mmol/L (ref 22–32)
Calcium: 8.5 mg/dL — ABNORMAL LOW (ref 8.9–10.3)
Chloride: 96 mmol/L — ABNORMAL LOW (ref 98–111)
Creatinine, Ser: 4.21 mg/dL — ABNORMAL HIGH (ref 0.44–1.00)
GFR calc non Af Amer: 11 mL/min — ABNORMAL LOW (ref >60)
Glucose, Bld: 138 mg/dL — ABNORMAL HIGH (ref 70–99)
Potassium: 3.5 mmol/L (ref 3.5–5.1)
Sodium: 137 mmol/L (ref 135–145)

## 2020-06-23 LAB — GLUCOSE, CAPILLARY
Glucose-Capillary: 163 mg/dL — ABNORMAL HIGH (ref 70–99)
Glucose-Capillary: 130 mg/dL — ABNORMAL HIGH (ref 70–99)
Glucose-Capillary: 159 mg/dL — ABNORMAL HIGH (ref 70–99)
Glucose-Capillary: 190 mg/dL — ABNORMAL HIGH (ref 70–99)

## 2020-06-23 LAB — HEPATITIS B SURFACE ANTIBODY, QUANTITATIVE: Hep B S AB Quant (Post): <3.1 m[IU]/mL — ABNORMAL LOW (ref >9.9)

## 2020-06-23 MED ORDER — HEPARIN SODIUM (PORCINE) 1000 UNIT/ML IJ SOLN
INTRAMUSCULAR | Status: AC
  Filled 2020-06-23: qty 5

## 2020-06-23 MED FILL — Lidocaine HCl Local Preservative Free (PF) Inj 1%: INTRAMUSCULAR | Qty: 30 | Status: AC

## 2020-06-23 NOTE — Progress Notes (Addendum)
PROGRESS NOTE    Donna Howe  OVZ:[id number] DOB: 1956/05/21 DOA: 06/18/2020 PCP: [facility], [facility]    Brief Narrative:  Patient admitted to the hospital with a working diagnosis of acute kidney injury on chronic kidney disease, complicated by hyperkalemia, uremia and bradycardia.  64 year old female with past medical history for chronic kidney disease stage V, type 2 diabetes mellitus, dyslipidemia, and hypertension.  Recent hospitalization at [facility] for non-STEMI.  Patient was readmitted today after discharge due to bradycardia.  At home patient had generalized weakness, fatigue, confusion and slurred speech.  On her initial physical examination her heart rate was 45 bpm, blood pressure 142/32, respiratory rate 18, oxygen saturation 90%.  Her lungs were clear to auscultation bilaterally, heart S1-S2, present, bradycardic, soft abdomen, positive bilateral lower extremity edema more right than left. Sodium 132, potassium 6.7, chloride 101, bicarb 20, glucose 306, BUN 106, creatinine 5.18, troponin I 482, BNP 2,129, white count 11.5, hemoglobin 9.2, hematocrit 27.5, platelets 88.  SARS COVID-19 negative. EKG 45 bpm, rightward axis, right bundle branch block, junctional rhythm, no significant ST segment changes, negative T wave V1 through V3.   Patient received medical therapy for hyperkalemia including Kayexalate, calcium gluconate, and insulin.  On 10/4 patient underwent placement of tunneled HD catheter in the right internal jugular and posteriorly underwent hemodialysis.   On 10/5 patient underwent coronary angiography showing diffuse and severe coronary artery disease, including native and graft vessels.  Positive in-stent restenosis LIMA to LAD.  Patient was referred for reevaluation for bypass grafting.  She had her first CABG approximately 16 years ago.   Assessment & Plan:   Principal Problem:   End stage renal disease ([place]) Active Problems:   Class 2  obesity   Bradycardia   Diabetes mellitus with renal manifestation (HCC)   Atherosclerosis of native artery of extremity with intermittent claudication (HCC)   CAD (coronary artery disease)   Carotid artery disease (HCC)   Anemia   Chronic diastolic HF (heart failure) (HCC)   Hyperkalemia   1. Hyperkalemia due to progression of CKD to end stage. Patient had HD today, feeling very weak after treatment, no chest pain. K is 3,5 and bicarbonate at 29. Clinically euvolemic.   Continue HD per nephrology recommendations, will need outpatient HD unit. Continue with torsemide to keep negative fluid balance, only minimal ultrafiltration per HD today to prevent hypotension.  Discontinue oral bicarbonate.  Follow up with vein mapping in preparation for AV fistula creation.  2. Severe coronary artery disease with recent NSTEMI/ acute on chronic diastolic heart failure. No active chest pain or dyspnea during hemodialysis. Echocardiogram with preserved LV systolic function 55 to 41%.  Now of nitroglycerin drip.   Continue medical therapy with aspirin, clopidogrel and atorvastatin. Holding on B blocker due to risk of bradycardia and holding on ace inh due to risk of recurrent hyperkalemia.  On torsemide to keep negative fluid balance.   Follow with cardiothoracic recommendations in regards of revascularization.   3. HTN. Continue blood pressure monitoring.   4. Controlled T2DM. Continue insulin sliding scale for glucose control and monitoring. Basal insulin 10 units glargine.   5. Anemia of chronic renal disease/ iron deficiency. hgb at 7.1 with hct at 22.6. In case of cardiovascular surgical intervention indicated will plan to transfuse prbc to target Hgb 8 to 9.  Continue with oral iron supplementation.   6. Anxiety. Continue with alprazolam as needed and qhs.   7. Hypothyroid. Continue with levothyroxine.   Patient  continue to be at high risk for worsening ischemia.   Status is:  Inpatient  Remains inpatient appropriate because:Inpatient level of care appropriate due to severity of illness   Dispo: The patient is from: Home              Anticipated d/c is to: Home              Anticipated d/c date is: 3 days              Patient currently is not medically stable to d/c.   DVT prophylaxis: Heparin   Code Status:   full  Family Communication:    I spoke over the phone with the patient's daughter in law  about patient's  condition, plan of care, prognosis and all questions were addressed. [phone]   Nutrition Status: Nutrition Problem: Inadequate oral intake Etiology: nausea, decreased appetite Signs/Symptoms: per patient/family report Interventions: Ensure Enlive (each supplement provides 350kcal and 20 grams of protein), MVI, Education      Consultants:   Cardiology   Nephrology   Procedures:   HD catheter  Cardiac catheterization      Subjective: Patient is feeling very tired after HD, no chest pain or dyspnea, no nausea or vomiting,   Objective: Vitals:   06/23/20 1105 06/23/20 1135 06/23/20 1157 06/23/20 1205  BP: (!) 120/58 (!) 117/56 (!) 112/48 (!) 112/48  Pulse: 64 70  64  Resp: 16 14  13   Temp:   97.6 F (36.4 C) 98.4 F (36.9 C)  TempSrc:   Oral Oral  SpO2: 100% 100%  100%  Weight:   98.2 kg   Height:        Intake/Output Summary (Last 24 hours) at 06/23/2020 1358 Last data filed at 06/23/2020 1157 Gross per 24 hour  Intake 180 ml  Output 4800 ml  Net -4620 ml   Filed Weights   06/22/20 0422 06/23/20 0915 06/23/20 1157  Weight: 101.5 kg 99.7 kg 98.2 kg    Examination:   General: Not in pain or dyspnea, deconditioned  Neurology: Awake and alert, non focal  E ENT: positive pallor and mild icterus, oral mucosa moist Cardiovascular: No JVD. S1-S2 present, rhythmic, no gallops, rubs, or murmurs. ++ pitting bilateral lower extremity edema. Pulmonary: positive breath sounds bilaterally,with no wheezing, rhonchi or  rales. Gastrointestinal. Abdomen soft and non tender Skin. No rashes Musculoskeletal: no joint deformities     Data Reviewed: I have personally reviewed following labs and imaging studies  CBC: Recent Labs  Lab 06/18/20 1543 06/18/20 1543 06/19/20 0236 06/20/20 0929 06/21/20 1043 06/22/20 0725 06/23/20 0354  WBC 11.5*   < > 13.3* 9.0 8.0 7.7 8.6  NEUTROABS 9.7*  --   --   --   --   --   --   HGB 9.2*   < > 7.7* 7.4* 7.8* 7.7* 7.1*  HCT 27.5*   < > 24.0* 23.2* 23.9* 23.2* 22.6*  MCV 91.7   < > 90.9 92.1 92.3 92.4 92.6  PLT 88*   < > 90* 94* 106* 104* 118*   < > = values in this interval not displayed.   Basic Metabolic Panel: Recent Labs  Lab 06/17/20 0551 06/17/20 0551 06/18/20 1543 06/18/20 1726 06/19/20 0236 06/19/20 0236 06/19/20 1038 06/20/20 0251 06/21/20 1043 06/22/20 0725 06/23/20 0354  NA 137   < > 132*   < > 135  --   --  137 138 138 137  K 5.3*   < >  6.7*   < > 5.7*   < > 4.7 4.5 3.4* 3.2* 3.5  CL 106   < > 101   < > 100  --   --  100 96* 99 96*  CO2 24   < > 20*   < > 21*  --   --  25 27 29 29   GLUCOSE 118*   < > 306*   < > 220*  --   --  138* 221* 165* 138*  BUN 91*   < > 106*   < > 110*  --   --  106* 103* 56* 58*  CREATININE 4.43*   < > 5.18*   < > 5.68*  --   --  5.60* 5.72* 3.93* 4.21*  CALCIUM 8.4*   < > 8.6*   < > 8.8*  --   --  8.3* 8.1* 8.1* 8.5*  MG  --   --  2.6*  --   --   --   --   --   --   --   --   PHOS 4.0  --   --   --   --   --   --  5.7*  --   --   --    < > = values in this interval not displayed.   GFR: Estimated Creatinine Clearance: 16.6 mL/min (A) (by C-G formula based on SCr of 4.21 mg/dL (H)). Liver Function Tests: Recent Labs  Lab 06/17/20 0551 06/19/20 0236 06/20/20 0251  AST  --  129*  --   ALT  --  211*  --   ALKPHOS  --  104  --   BILITOT  --  1.2  --   PROT  --  5.5*  --   ALBUMIN 2.9* 2.8* 2.5*   No results for input(s): LIPASE, AMYLASE in the last 168 hours. No results for input(s): AMMONIA in the last  168 hours. Coagulation Profile: No results for input(s): INR, PROTIME in the last 168 hours. Cardiac Enzymes: No results for input(s): CKTOTAL, CKMB, CKMBINDEX, TROPONINI in the last 168 hours. BNP (last 3 results) No results for input(s): PROBNP in the last 8760 hours. HbA1C: No results for input(s): HGBA1C in the last 72 hours. CBG: Recent Labs  Lab 06/22/20 0729 06/22/20 1751 06/22/20 2202 06/23/20 0819 06/23/20 1242  GLUCAP 179* 149* 230* 163* 130*   Lipid Profile: No results for input(s): CHOL, HDL, LDLCALC, TRIG, CHOLHDL, LDLDIRECT in the last 72 hours. Thyroid Function Tests: No results for input(s): TSH, T4TOTAL, FREET4, T3FREE, THYROIDAB in the last 72 hours. Anemia Panel: No results for input(s): VITAMINB12, FOLATE, FERRITIN, TIBC, IRON, RETICCTPCT in the last 72 hours.    Radiology Studies: I have reviewed all of the imaging during this hospital visit personally     Scheduled Meds: . ALPRAZolam  0.25 mg Oral BID  . ALPRAZolam  0.5 mg Oral QHS  . aspirin EC  81 mg Oral Daily  . atorvastatin  80 mg Oral QPM  . Chlorhexidine Gluconate Cloth  6 each Topical Q0600  . Chlorhexidine Gluconate Cloth  6 each Topical Q0600  . clopidogrel  75 mg Oral Daily  . feeding supplement (ENSURE ENLIVE)  237 mL Oral BID BM  . ferrous sulfate  325 mg Oral Q lunch  . heparin  5,000 Units Subcutaneous Q8H  . insulin aspart  0-5 Units Subcutaneous QHS  . insulin aspart  0-9 Units Subcutaneous TID WC  . insulin glargine  10  Units Subcutaneous Daily  . levothyroxine  125 mcg Oral Daily  . multivitamin  1 tablet Oral QHS  . pantoprazole  40 mg Oral BID  . sodium bicarbonate  650 mg Oral BID  . sodium chloride flush  3 mL Intravenous Q12H  . sodium chloride flush  3 mL Intravenous Q12H  . torsemide  100 mg Oral Daily  . Vitamin D (Ergocalciferol)  50,000 Units Oral Q Mon   Continuous Infusions: . sodium chloride    . calcium chloride  IV    . nitroGLYCERIN Stopped (06/23/20  0207)     LOS: 5 days        Callaghan Laverdure Gerome Apley, MD

## 2020-06-23 NOTE — Progress Notes (Signed)
[facility] Progress Note  Name: Donna Howe MRN: [id number] DOB: [birth year]  Chief Complaint:  fatigue  Subjective:  Diffuse disease on LHC yesterday, had some CP after procedure so deferred on dialysis until today.  Overnight modest hypotension 90s - NTG gtt stopped, she's CP free.  Just tired.  Not clear on in hospital plan -- hopes to clarify with cardiology today.      Intake/Output Summary (Last 24 hours) at 06/23/2020 0815 Last data filed at 06/22/2020 2210 Gross per 24 hour  Intake 180 ml  Output 3300 ml  Net -3120 ml    Vitals:  Vitals:   06/22/20 2029 06/23/20 0200 06/23/20 0208 06/23/20 0605  BP: (!) 121/58 (!) 90/44 (!) 107/47 (!) 111/55  Pulse: 77   66  Resp: 18   19  Temp: 98.6 F (37 C)   98.3 F (36.8 C)  TempSrc: Oral   Oral  SpO2: 97%   95%  Weight:      Height:         Physical Exam:  General: adult female in bed in NAD  HEENT: NCAT Eyes: EOMI sclera anicteric Neck: supple trachea midline Heart: S1S2 no rub Lungs: clear with normal WOB Abdomen: softly dist/obese habitus/nontender Extremities: 1+ edema Skin: no rash on extremities exposed Psych normal mood and affect Neuro: alert and oriented x 3; provides hx and follows commands  Medications reviewed   Labs:  BMP Latest Ref Rng & Units 06/23/2020 06/22/2020 06/21/2020  Glucose 70 - 99 mg/dL 138(H) 165(H) 221(H)  BUN 8 - 23 mg/dL 58(H) 56(H) 103(H)  Creatinine 0.44 - 1.00 mg/dL 4.21(H) 3.93(H) 5.72(H)  Sodium 135 - 145 mmol/L 137 138 138  Potassium 3.5 - 5.1 mmol/L 3.5 3.2(L) 3.4(L)  Chloride 98 - 111 mmol/L 96(L) 99 96(L)  CO2 22 - 32 mmol/L 29 29 27   Calcium 8.9 - 10.3 mg/dL 8.5(L) 8.1(L) 8.1(L)     Assessment/Plan:   # NSTEMI  - s/p LHC 10/5 with diffuse disease --> redo CABG likely - noted cardiology has stopped beta blocker due to bradycardia  # CKD stage V with progression to end-stage renal disease -s/p TDC 10/4, 1st HD 10/4, plan 2nd today - hold on  permanent access in setting of NSTEMI but hopefully will be able to place prior to d/c.  Vein mapping ordered. - She will need CLIP to outpatient HD unit - underway  # Hypokalemia: repleted today (had hyperkalemia earlier in course)  # Acute on chronic diastolic CHF  - cont po diuretics today --> UOP yesterday was 3.3L but may drop off after contrast load - small vol UF with HD as tolerated  # HTN  - BPs have been reasonable but a bit low overnight on NTG.  Follow  # Anemia of CKD  - Defer ESA currently in setting of NSTEMI  - Transfuse as needed - appears iron is ok (ferritin low but iron level and tsat ok)  Justin Mend, MD 06/23/2020 8:15 AM

## 2020-06-23 NOTE — Consult Note (Addendum)
[address]       [place] [address]             [phone]        Donna Howe [place] Medical Record #[id number] Date of Birth: 1956/03/15  Referring: [facility] Primary Care: [facility], [facility] Cardiologist:Samuel Domenic Polite, MD  Chief Complaint:    Chief Complaint  Patient presents with  . Bradycardia   History of Present Illness:      Donna Howe is a 64 yo female with known history of Type 2 DM, Thrombocytopenia, SBO, H/O CABG in 2005 with Dr. Cyndia Bent, PAD, Hypothyroidism, Hyperlipidemia, HTN, and Renal disease now on dialysis.  She presented to the ED with complaints of chest pain 2 days in duration on 06/13/2020.  The patient states the pain was located in the center of her chest on the left side.  This radiated down her left arm and into her back.  She also experienced associated diaphoresis and dizziness.  The symptoms worsened with exertion. The patient took NTG and initially achieved relief.  On the day of presentation the patient again took NTG but unfortunately she did not get relief after 2 NTG tablets.  Symptoms were similar to when she experience heart attack with subsequent bypass surgery in the past.  Since her heart surgery she has had multiple catheterization with subsequent stent placement.  She also mentions over the past 2 days she has noticed decreased urinary output only voiding 3 times in the past 24 hours.  She states she had previously been stage 3 but she was getting ready to have a fistula placed.  She was recently treated with an outside provider for presumed bronchitis.  Workup showed elevated creatinine at 4.77.  EKG obtained showed a repolarization abnormality.  Troponin levels were positive and the patient was ruled in for NSTEMI.  She was admitted to [facility] and treated medically due to Renal disease.  She again presented to the ED on 10/1 with complaints of generalized weakness, fatigue, mild confusion,  and slurred speech.  The patient denied symptoms of chest pain, shortness of breath, nor had she not taken any medications.  EKG was obtained and showed bradycardia with a new RBBB.  Her creatinine was worse than a week ago at 5.18, her BNP was elevated at 2,129, K was 6.7 and was treated accordingly.  The patient was admitted via the hospitalist service and transferred to [facility] for initiation of dialysis. Upon arrival to [facility] she underwent placement of tunneled dialysis catheter.  This was done on 10/2 and dialysis was initiated once catheter was in place.  Cardiology consult was obtained and the patient underwent cardiac catheterization 06/22/2020 which showed sever native and coronary bypass graft disease.  They did not feel PCI was indicated at this time and they requested cardiothoracic surgery consultation. The patient has remained chest pain free.  The patient states she feels okay.  She just got back from her second dialysis session.  She wonders when she can get a fistula placed.  She states overall she remains functional and can take care of herself.  She denies smoking history.     Current Activity/ Functional Status: Patient is independent with mobility/ambulation, transfers, ADL's, IADL's.   Zubrod Score: At the time of surgery this patient's most appropriate activity status/level should be described as: []     0    Normal activity, no symptoms []     1  Restricted in physical strenuous activity but ambulatory, able to do out light work [x]     2    Ambulatory and capable of self care, unable to do work activities, up and about                 more than 50%  Of the time                            []     3    Only limited self care, in bed greater than 50% of waking hours []     4    Completely disabled, no self care, confined to bed or chair []     5    Moribund  Past Medical History:  Diagnosis Date  . Anemia   . Asthma   . CAD (coronary artery disease)     Multivessel s/p CABG 2005, numerous PCIs since that time  . Carotid artery disease (HCC)    R CEA  . Chronic kidney disease (CKD), stage III (moderate) (HCC)   . Essential hypertension   . Gout   . Hyperlipidemia   . Hypothyroidism   . Myocardial infarction ([place])   . PAD (peripheral artery disease) ([place])    Dr. Kellie Simmering  . S/P angioplasty with stent- DES to [facility] and to LIMA to LAD with DES 04/09/18.   04/10/2018  . SBO (small bowel obstruction) ([place]) 2011   Status post lysis of adhesions & hernia repair  . Sinus bradycardia   . Thrombocytopenia, unspecified ([place])   . Type 2 diabetes mellitus ([place])   . Umbilical hernia     Past Surgical History:  Procedure Laterality Date  . CESAREAN SECTION  1984  . CHOLECYSTECTOMY  2010  . CORONARY ARTERY BYPASS GRAFT  2005  . CORONARY BALLOON ANGIOPLASTY N/A 05/31/2017   Procedure: CORONARY BALLOON ANGIOPLASTY;  Surgeon: Jettie Booze, MD;  Location: [place] CV LAB;  Service: Cardiovascular;  Laterality: N/A;  . CORONARY STENT INTERVENTION N/A 05/31/2017   Procedure: CORONARY STENT INTERVENTION;  Surgeon: Jettie Booze, MD;  Location: [place] CV LAB;  Service: Cardiovascular;  Laterality: N/A;  . CORONARY STENT INTERVENTION N/A 04/09/2018   Procedure: CORONARY STENT INTERVENTION;  Surgeon: Jettie Booze, MD;  Location: [place] CV LAB;  Service: Cardiovascular;  Laterality: N/A;  SVG RCA  . ENDARTERECTOMY Right 04/18/2013   Procedure: ENDARTERECTOMY CAROTID;  Surgeon: Mal Misty, MD;  Location: [place];  Service: Vascular;  Laterality: Right;  . HERNIA REPAIR  1989  . Incisional hernia repair x2  03/04/2010   Laparoscopic with 35cm mesh by Dr Ronnald Collum  . IR FLUORO GUIDE CV LINE RIGHT  06/21/2020  . IR US GUIDE VASC ACCESS RIGHT  06/21/2020  . LEFT HEART CATH AND CORS/GRAFTS ANGIOGRAPHY N/A 05/31/2017   Procedure: LEFT HEART CATH AND CORS/GRAFTS ANGIOGRAPHY;  Surgeon: Jettie Booze, MD;  Location: [place] CV  LAB;  Service: Cardiovascular;  Laterality: N/A;  . LEFT HEART CATH AND CORS/GRAFTS ANGIOGRAPHY N/A 04/08/2018   Procedure: LEFT HEART CATH AND CORS/GRAFTS ANGIOGRAPHY;  Surgeon: Jettie Booze, MD;  Location: [place] CV LAB;  Service: Cardiovascular;  Laterality: N/A;  . LEFT HEART CATH AND CORS/GRAFTS ANGIOGRAPHY N/A 06/22/2020   Procedure: LEFT HEART CATH AND CORS/GRAFTS ANGIOGRAPHY;  Surgeon: Belva Crome, MD;  Location: [place] CV LAB;  Service: Cardiovascular;  Laterality: N/A;  . LEFT HEART CATHETERIZATION WITH CORONARY ANGIOGRAM  N/A 12/19/2012   Procedure: LEFT HEART CATHETERIZATION WITH CORONARY ANGIOGRAM;  Surgeon: Josue Hector, MD;  Location: [facility] CATH LAB;  Service: Cardiovascular;  Laterality: N/A;  . LEFT HEART CATHETERIZATION WITH CORONARY/GRAFT ANGIOGRAM N/A 04/19/2013   Procedure: LEFT HEART CATHETERIZATION WITH Beatrix Fetters;  Surgeon: Lorretta Harp, MD;  Location: [facility] CATH LAB;  Service: Cardiovascular;  Laterality: N/A;  . PATCH ANGIOPLASTY Right 04/18/2013   Procedure: PATCH ANGIOPLASTY Right Internal Carotid Artery;  Surgeon: Mal Misty, MD;  Location: [place];  Service: Vascular;  Laterality: Right;  . PERCUTANEOUS CORONARY STENT INTERVENTION (PCI-S) Right 12/19/2012   Procedure: PERCUTANEOUS CORONARY STENT INTERVENTION (PCI-S);  Surgeon: Josue Hector, MD;  Location: [facility] CATH LAB;  Service: Cardiovascular;  Laterality: Right;  . SHOULDER SURGERY      Social History   Tobacco Use  Smoking Status Former Smoker  . Packs/day: 1.00  . Years: 20.00  . Pack years: 20.00  . Types: Cigarettes  . Quit date: 12/10/2012  . Years since quitting: 7.5  Smokeless Tobacco Never Used    Social History   Substance and Sexual Activity  Alcohol Use No  . Alcohol/week: 0.0 standard drinks     Allergies  Allergen Reactions  . Penicillins Other (See Comments)    REACTION: Unknown, told as a child Has patient had a PCN reaction causing immediate rash,  facial/tongue/throat swelling, SOB or lightheadedness with hypotension: Unknown Has patient had a PCN reaction causing severe rash involving mucus membranes or skin necrosis: Unknown Has patient had a PCN reaction that required hospitalization: Unknown Has patient had a PCN reaction occurring within the last 10 years: No If all of the above answers are "NO", then may proceed with Cephalosporin use.     Current Facility-Administered Medications  Medication Dose Route Frequency Provider Last Rate Last Admin  . 0.9 %  sodium chloride infusion  250 mL Intravenous PRN Belva Crome, MD      . acetaminophen (TYLENOL) tablet 650 mg  650 mg Oral Q6H PRN Belva Crome, MD   650 mg at 06/21/20 1043  . albuterol (PROVENTIL) (2.5 MG/3ML) 0.083% nebulizer solution 2.5 mg  2.5 mg Nebulization Q2H PRN Belva Crome, MD      . ALPRAZolam Duanne Moron) tablet 0.25 mg  0.25 mg Oral BID Belva Crome, MD   0.25 mg at 06/22/20 1748  . ALPRAZolam Duanne Moron) tablet 0.5 mg  0.5 mg Oral QHS Belva Crome, MD   0.5 mg at 06/22/20 2201  . aspirin EC tablet 81 mg  81 mg Oral Daily Belva Crome, MD   81 mg at 06/23/20 1329  . atorvastatin (LIPITOR) tablet 80 mg  80 mg Oral QPM Belva Crome, MD   80 mg at 06/22/20 1748  . calcium chloride 1 g in sodium chloride 0.9 % 100 mL IVPB  1 g Intravenous Once Belva Crome, MD      . Chlorhexidine Gluconate Cloth 2 % PADS 6 each  6 each Topical Q0600 Belva Crome, MD   6 each at 06/19/20 1419  . Chlorhexidine Gluconate Cloth 2 % PADS 6 each  6 each Topical Q0600 Belva Crome, MD   6 each at 06/23/20 0617  . clopidogrel (PLAVIX) tablet 75 mg  75 mg Oral Daily Belva Crome, MD   75 mg at 06/23/20 1329  . feeding supplement (ENSURE ENLIVE) (ENSURE ENLIVE) liquid 237 mL  237 mL Oral BID BM Debbe Odea, MD  237 mL at 06/23/20 1330  . ferrous sulfate tablet 325 mg  325 mg Oral Q lunch Belva Crome, MD   325 mg at 06/23/20 1330  . heparin injection 5,000 Units  5,000 Units  Subcutaneous Q8H Belva Crome, MD   5,000 Units at 06/23/20 1330  . insulin aspart (novoLOG) injection 0-5 Units  0-5 Units Subcutaneous QHS Belva Crome, MD   2 Units at 06/22/20 2209  . insulin aspart (novoLOG) injection 0-9 Units  0-9 Units Subcutaneous TID WC Belva Crome, MD   1 Units at 06/22/20 1751  . insulin glargine (LANTUS) injection 10 Units  10 Units Subcutaneous Daily Belva Crome, MD   10 Units at 06/23/20 [phone]  . levothyroxine (SYNTHROID) tablet 125 mcg  125 mcg Oral Daily Belva Crome, MD   125 mcg at 06/23/20 0615  . morphine 2 MG/ML injection 2 mg  2 mg Intravenous Q4H PRN Belva Crome, MD   2 mg at 06/21/20 0951  . multivitamin (RENA-VIT) tablet 1 tablet  1 tablet Oral QHS Debbe Odea, MD   1 tablet at 06/22/20 2201  . nitroGLYCERIN (NITROSTAT) SL tablet 0.4 mg  0.4 mg Sublingual Q5 min PRN Belva Crome, MD   0.4 mg at 06/22/20 1250  . nitroGLYCERIN 50 mg in dextrose 5 % 250 mL (0.2 mg/mL) infusion  0-200 mcg/min Intravenous Titrated Belva Crome, MD   Stopped at 06/23/20 0207  . ondansetron (ZOFRAN) injection 4 mg  4 mg Intravenous Q6H PRN Belva Crome, MD      . pantoprazole (PROTONIX) EC tablet 40 mg  40 mg Oral BID Belva Crome, MD   40 mg at 06/23/20 1331  . sodium bicarbonate tablet 650 mg  650 mg Oral BID Belva Crome, MD   650 mg at 06/23/20 1331  . sodium chloride flush (NS) 0.9 % injection 3 mL  3 mL Intravenous Q12H Belva Crome, MD   3 mL at 06/23/20 1332  . sodium chloride flush (NS) 0.9 % injection 3 mL  3 mL Intravenous Q12H Belva Crome, MD   3 mL at 06/23/20 1331  . sodium chloride flush (NS) 0.9 % injection 3 mL  3 mL Intravenous PRN Belva Crome, MD      . torsemide [facility]) tablet 100 mg  100 mg Oral Daily Belva Crome, MD   100 mg at 06/23/20 1331  . Vitamin D (Ergocalciferol) (DRISDOL) capsule 50,000 Units  50,000 Units Oral Q Mon Elgergawy, Silver Huguenin, MD   50,000 Units at 06/21/20 1026    Medications Prior to Admission    Medication Sig Dispense Refill Last Dose  . Albuterol Sulfate 108 (90 Base) MCG/ACT AEPB Inhale 2 puffs into the lungs every 4 (four) hours as needed (Shortness of breath).    06/17/2020 at Unknown time  . ALPRAZolam (XANAX) 0.5 MG tablet Take 0.25-0.5 mg by mouth See admin instructions. Take 0.25 mg tab by mouth every morning & evening as needed  and .05 mg tab at bedtime   06/17/2020 at Unknown time  . aspirin EC 81 MG tablet Take 81 mg by mouth daily.    06/17/2020 at Unknown time  . atorvastatin (LIPITOR) 80 MG tablet Take 1 tablet (80 mg total) by mouth every evening.   06/17/2020 at Unknown time  . benzonatate (TESSALON) 100 MG capsule Take 100 mg by mouth 3 (three) times daily as needed for cough.   Past Week at  Unknown time  . clopidogrel (PLAVIX) 75 MG tablet Take 1 tablet (75 mg total) by mouth daily. 90 tablet 3 06/17/2020 at Unknown time  . ferrous sulfate 325 (65 FE) MG tablet Take 325 mg by mouth daily with breakfast.   06/17/2020 at Unknown time  . gabapentin (NEURONTIN) 400 MG capsule Take 1 capsule (400 mg total) by mouth 2 (two) times daily.   06/17/2020  . ipratropium (ATROVENT HFA) 17 MCG/ACT inhaler Inhale 2 puffs into the lungs See admin instructions. Inhale 2 puffs every 4 to 6 hrs as needed.   Past Week at Unknown time  . levothyroxine (SYNTHROID, LEVOTHROID) 125 MCG tablet Take 125 mcg by mouth daily.    06/17/2020  . NOVOLIN 70/30 RELION (70-30) 100 UNIT/ML injection Inject 24 Units into the skin 2 (two) times daily with a meal. Am & PM   06/17/2020  . ondansetron (ZOFRAN ODT) 8 MG disintegrating tablet Take 1 tablet (8 mg total) by mouth every 8 (eight) hours as needed for nausea or vomiting. 20 tablet 0 06/17/2020  . pantoprazole (PROTONIX) 40 MG tablet Take 1 tablet (40 mg total) by mouth 2 (two) times daily. 60 tablet 1 06/17/2020  . ranolazine (RANEXA) 500 MG 12 hr tablet Take 1 tablet (500 mg total) by mouth 2 (two) times daily. 60 tablet 1 06/17/2020  . sodium bicarbonate 650  MG tablet Take 650 mg by mouth 2 (two) times daily.   06/17/2020  . Vitamin D, Ergocalciferol, (DRISDOL) 1.25 MG (50000 UNIT) CAPS capsule Take 50,000 Units by mouth once a week.   06/13/2020  . metoprolol tartrate (LOPRESSOR) 25 MG tablet Take 0.5 tablets (12.5 mg total) by mouth 2 (two) times daily. 60 tablet 1 on hold  . nitroGLYCERIN (NITROSTAT) 0.4 MG SL tablet Place 0.4 mg under the tongue every 5 (five) minutes as needed for chest pain. Not to exceed 3 in 15 minute time frame   unk    Family History  Problem Relation Age of Onset  . Diabetes Mother   . Heart disease Mother        before age 75  . Hyperlipidemia Mother   . Hypertension Mother   . Thyroid disease Father   . Hypertension Father   . AAA (abdominal aortic aneurysm) Father   . Heart disease Brother        before age 81  . Hypertension Brother   . Hyperlipidemia Son   . Hypertension Son    Review of Systems:     Cardiac Review of Systems: Y or  [    ]= no  Chest Pain [ Y   ]  Resting SOB [   ] Exertional SOB  [Y  ]  Orthopnea [  ]   Pedal Edema [ Y ]    Palpitations [  ] Syncope  [  ]   Presyncope [ N  ]  General Review of Systems: [Y] = yes [  ]=no Constitional: recent weight change [Y  ]; anorexia [  ]; fatigue [Y  ]; nausea [ Y ]; night sweats [  ]; fever [  ]; or chills [  ]  Dental: Last Dentist visit:   Eye : blurred vision [  ]; diplopia [   ]; vision changes [  ];  Amaurosis fugax[  ]; Resp: cough [  ];  wheezing[  ];  hemoptysis[  ]; shortness of breath[  ]; paroxysmal nocturnal dyspnea[  ]; dyspnea on exertion[ Y ]; or orthopnea[  ];  GI:  gallstones[  ], vomiting[ N ];  dysphagia[  ]; melena[  ];  hematochezia [  ]; heartburn[  ];   Hx of  Colonoscopy[  ]; GU: kidney stones [  ]; hematuria[  ];   dysuria [  ];  nocturia[  ];  history of obstruction [  ]; urinary frequency [  ]             Skin: rash, swelling[  ];, hair loss[  ];  peripheral  edema[Y  ];  or itching[  ]; Musculosketetal: myalgias[  ];  joint swelling[  ];  joint erythema[  ];  joint pain[  ];  back pain[  ];  Heme/Lymph: bruising[  ];  bleeding[  ];  anemia[  ];  Neuro: TIA[ N ];  headaches[  ];  stroke[  ];  vertigo[  ];  seizures[  ];   paresthesias[  ];  difficulty walking[  ];  Psych:depression[  ]; anxiety[  ];  Endocrine: diabetes[ Y ];  thyroid dysfunction[Y ];        Physical Exam: BP (!) 112/48 (BP Location: Right Arm)   Pulse 64   Temp 98.4 F (36.9 C) (Oral)   Resp 13   Ht 5' 5.98" (1.676 m)   Wt 98.2 kg   SpO2 100%   BMI 34.96 kg/m   General appearance: alert, cooperative and no distress Neck: no adenopathy, no carotid bruit, no JVD, supple, symmetrical, trachea midline and thyroid not enlarged, symmetric, no tenderness/mass/nodules Resp: clear to auscultation bilaterally Cardio: regular rate and rhythm GI: soft, non-tender; bowel sounds normal; no masses,  no organomegaly Extremities: edema + pitting Neurologic: Grossly normal  Diagnostic Studies & Laboratory data:     Recent Radiology Findings:   CARDIAC CATHETERIZATION  Result Date: 06/22/2020  Occlusion of the sequential saphenous vein graft to the obtuse marginal and PDA (left).  Patent saphenous vein graft to the mid RCA.  Patent ostial to proximal saphenous vein graft stent.  Patent stent in the mid RCA beyond the graft insertion site  Patent LIMA to mid LAD.  Ostial LIMA stent has 50 to 70% ISR.  Cath engagement causes damping of pressure.  Totally occluded proximal RCA  Functionally occluded left main with very faint opacification of the circumflex system despite the absence of a clear channel.  99% stenosis of the ostial to proximal circumflex stent.  Total occlusion of the proximal LAD.  Normal left ventricular end-diastolic pressure. RECOMMENDATIONS:  Severe native and bypass graft disease.  No obvious target for PCI this time.  Revascularization strategy would likely  include repeat coronary bypass surgery when left internal mammary ostial stent progresses.  VAS [place] UPPER EXT VEIN MAPPING (PRE-OP AVF)  Result Date: 06/22/2020 UPPER EXTREMITY VEIN MAPPING  Indications: Pre-access. Comparison Study: No prior studies. Performing Technologist: Oliver Hum RVT  Examination Guidelines: A complete evaluation includes B-mode imaging, spectral Doppler, color Doppler, and power Doppler as needed of all accessible portions of each vessel. Bilateral testing is considered an integral part of a complete examination. Limited examinations for reoccurring indications may be performed as noted. +-----------------+-------------+----------+---------+ Right Cephalic   Diameter (  cm)Depth (cm)Findings  +-----------------+-------------+----------+---------+ Shoulder             0.43        2.00             +-----------------+-------------+----------+---------+ Prox upper arm       0.29        2.10             +-----------------+-------------+----------+---------+ Mid upper arm        0.25        1.30             +-----------------+-------------+----------+---------+ Dist upper arm       0.33        0.94             +-----------------+-------------+----------+---------+ Antecubital fossa    0.35        0.55   branching +-----------------+-------------+----------+---------+ Prox forearm         0.22        0.89             +-----------------+-------------+----------+---------+ Mid forearm          0.22        0.64             +-----------------+-------------+----------+---------+ Dist forearm         0.15        0.47             +-----------------+-------------+----------+---------+ +-----------------+-------------+----------+--------------+ Right Basilic    Diameter (cm)Depth (cm)   Findings    +-----------------+-------------+----------+--------------+ Shoulder             0.47        1.34                   +-----------------+-------------+----------+--------------+ Mid upper arm        0.29        1.50                  +-----------------+-------------+----------+--------------+ Dist upper arm       0.18        1.21                  +-----------------+-------------+----------+--------------+ Antecubital fossa    0.16        1.30     branching    +-----------------+-------------+----------+--------------+ Prox forearm         0.11        0.51                  +-----------------+-------------+----------+--------------+ Mid forearm                             not visualized +-----------------+-------------+----------+--------------+ Distal forearm                          not visualized +-----------------+-------------+----------+--------------+ +-----------------+-------------+----------+----------------+ Left Cephalic    Diameter (cm)Depth (cm)    Findings     +-----------------+-------------+----------+----------------+ Shoulder             0.40        1.70                    +-----------------+-------------+----------+----------------+ Prox upper arm       0.55        1.44                    +-----------------+-------------+----------+----------------+ Mid  upper arm        0.34        1.10      branching     +-----------------+-------------+----------+----------------+ Dist upper arm       0.37        0.68                    +-----------------+-------------+----------+----------------+ Antecubital fossa    0.47        0.32   Partial thrombus +-----------------+-------------+----------+----------------+ Prox forearm         0.38        0.74                    +-----------------+-------------+----------+----------------+ Mid forearm          0.46        0.44                    +-----------------+-------------+----------+----------------+ Dist forearm         0.45        0.39   Partial thrombus  +-----------------+-------------+----------+----------------+ +-----------------+-------------+----------+--------------+ Left Basilic     Diameter (cm)Depth (cm)   Findings    +-----------------+-------------+----------+--------------+ Shoulder             0.37        1.80                  +-----------------+-------------+----------+--------------+ Mid upper arm        0.42        1.40                  +-----------------+-------------+----------+--------------+ Dist upper arm       0.43        1.50                  +-----------------+-------------+----------+--------------+ Antecubital fossa    0.39        0.93                  +-----------------+-------------+----------+--------------+ Prox forearm         0.10        0.31                  +-----------------+-------------+----------+--------------+ Mid forearm                             not visualized +-----------------+-------------+----------+--------------+ Distal forearm                          not visualized +-----------------+-------------+----------+--------------+ *See table(s) above for measurements and observations.   Diagnosing physician: Servando Snare MD Electronically signed by Servando Snare MD on 06/22/2020 at 12:18:51 PM.    Final      I have independently reviewed the above radiologic studies and discussed with the patient   Recent Lab Findings: Lab Results  Component Value Date   WBC 8.6 06/23/2020   HGB 7.1 (L) 06/23/2020   HCT 22.6 (L) 06/23/2020   PLT 118 (L) 06/23/2020   GLUCOSE 138 (H) 06/23/2020   CHOL 133 06/13/2020   TRIG 169 (H) 06/13/2020   HDL 32 (L) 06/13/2020   LDLCALC 67 06/13/2020   ALT 211 (H) 06/19/2020   AST 129 (H) 06/19/2020   NA 137 06/23/2020   K 3.5 06/23/2020   CL 96 (L) 06/23/2020   CREATININE 4.21 (H) 06/23/2020   BUN 58 (H)  06/23/2020   CO2 29 06/23/2020   TSH 1.057 06/18/2020   INR 1.0 11/26/2019   HGBA1C 6.2 (H) 06/13/2020   Assessment / Plan:        1. CV- CAD, native and previous coronary bypass grafts, NSTEMI on 9/26.... requesting possible CABG consult 2. Stg V CKD- now on dialysis per Neprhology 3. DM 4. Hypothyroidism 5. HTN 6. Hyperlipidemia   Dispo- patient currently chest pain free.  Patient had bypass surgery approx 16 years ago by Dr. Cyndia Bent.  He is aware of patient being in the hospital.  He will evaluate and follow up with recommendations.  I  spent 55 minutes counseling the patient face to face.   Ellwood Handler, PA-C 06/23/2020 1:34 PM  I have personally reviewed her medical record, echocardiogram, and cardiac catheterization studies as well as interviewing and examining the patient.  She is a 64 year old woman with a long history of poorly controlled diabetes, hyperlipidemia, hypertension, chronic kidney disease she is starting dialysis, carotid artery disease status post right carotid endarterectomy and coronary artery disease status post coronary bypass graft surgery x4 by me in 2005 as well as multiple stenting procedures both before and since CABG.  At the time of CABG she had a left internal mammary graft to the LAD with a sequential saphenous vein graft to the OM and PDA branches of left circumflex as well as a saphenous vein graft to the right coronary artery.  Since her surgery she has undergone stenting of the vein graft to the RCA, stenting of the ostium of the left internal mammary artery graft, and stenting of the left main and proximal left circumflex coronary arteries.  She now presents with a non-ST segment elevation MI and episodes of pain in her left arm and shoulder.  She has had a several month history of exertional shortness of breath and fatigue.  She had her second episode of dialysis today.  2D echocardiogram showed an ejection fraction of 55 to 60% with mild to moderate mitral regurgitation.  Cardiac catheterization showed occlusion of the sequential saphenous vein graft to the obtuse marginal left PDA.  The  saphenous vein graft to the RCA was patent.  The proximal RCA was totally occluded.  The LIMA to the LAD was patent with 50 to 70% in-stent restenosis and catheter engagement causing dampening of the pressure tracing.  The LAD was occluded proximally.  The left main was essentially occluded with very faint opacification of left circumflex system despite the absence of a clear channel.  There is 99% stenosis of the ostial proximal left circumflex stent.    The current catheterization shows very faint opacification of left circumflex system.  I reviewed her prior cardiac catheterization from 2019 and 2018 as well as my prior operative note.  The obtuse marginal bifurcated into 2 subbranches with one branch being tiny and the other moderate sized.  The obtuse marginal was noted to be visible only proximally where it was grafted and then became intramyocardial throughout the remainder of its course.  The distal left circumflex terminates in the posterior descending and several small posterior lateral branches.  All these are small and would not be graftable where they could be visualized on the back of the heart under re-do circumstances.  She has a patent vein graft to the RCA and a patent LIMA to the LAD and I do not think her left circumflex branches are adequate to warrant redo CABG, especially in a morbidly obese, diabetic renal failure patient  just starting dialysis.  She would have very high operative risk and I doubt that I would be able to graft the obtuse marginal or PDA having had prior bypass surgery with a difficulty exposing the lateral wall, small vessels, and intramyocardial obtuse marginal.  I discussed this with the patient and answered her questions.  Cardiology will need to decide whether there is any high risk PCI option for the left main and left circumflex or is a better to continue medical therapy.

## 2020-06-23 NOTE — Progress Notes (Signed)
Patient having low blood pressure readings. 107/47, nitro at lowest 61mcg. Stopped iv nitro per protocol. Patient denying chest pain. Paged Cardiology. Will continue to monitor patient.

## 2020-06-23 NOTE — Progress Notes (Signed)
Patient had 7 run of Vtach, upon assessment, patient denying palpations and chest pain. Will continue to monitor patient.

## 2020-06-23 NOTE — Progress Notes (Addendum)
Progress Note  Patient Name: Donna Howe Date of Encounter: 06/23/2020  Primary Cardiologist: Rozann Lesches, MD   Subjective   No recurrent chest pain since stopping IV nitro overnight. Breathing is stable. No complaints of palpitations. Going to dialysis shortly. We discussed her cath results in detail. She was disappointed with the findings. Asked for her daughter-in-law, Jeannene Patella, to be updated with her hospital course.   Inpatient Medications    Scheduled Meds: . ALPRAZolam  0.25 mg Oral BID  . ALPRAZolam  0.5 mg Oral QHS  . aspirin EC  81 mg Oral Daily  . atorvastatin  80 mg Oral QPM  . Chlorhexidine Gluconate Cloth  6 each Topical Q0600  . Chlorhexidine Gluconate Cloth  6 each Topical Q0600  . clopidogrel  75 mg Oral Daily  . feeding supplement (ENSURE ENLIVE)  237 mL Oral BID BM  . ferrous sulfate  325 mg Oral Q lunch  . heparin  5,000 Units Subcutaneous Q8H  . insulin aspart  0-5 Units Subcutaneous QHS  . insulin aspart  0-9 Units Subcutaneous TID WC  . insulin glargine  10 Units Subcutaneous Daily  . levothyroxine  125 mcg Oral Daily  . multivitamin  1 tablet Oral QHS  . pantoprazole  40 mg Oral BID  . sodium bicarbonate  650 mg Oral BID  . sodium chloride flush  3 mL Intravenous Q12H  . sodium chloride flush  3 mL Intravenous Q12H  . torsemide  100 mg Oral Daily  . Vitamin D (Ergocalciferol)  50,000 Units Oral Q Mon   Continuous Infusions: . sodium chloride    . calcium chloride  IV    . nitroGLYCERIN Stopped (06/23/20 0207)   PRN Meds: sodium chloride, acetaminophen **OR** [DISCONTINUED] acetaminophen, albuterol, morphine injection, nitroGLYCERIN, ondansetron (ZOFRAN) IV, sodium chloride flush   Vital Signs    Vitals:   06/22/20 2029 06/23/20 0200 06/23/20 0208 06/23/20 0605  BP: (!) 121/58 (!) 90/44 (!) 107/47 (!) 111/55  Pulse: 77   66  Resp: 18   19  Temp: 98.6 F (37 C)   98.3 F (36.8 C)  TempSrc: Oral   Oral  SpO2: 97%   95%  Weight:        Height:        Intake/Output Summary (Last 24 hours) at 06/23/2020 0628 Last data filed at 06/22/2020 2210 Gross per 24 hour  Intake 180 ml  Output 3300 ml  Net -3120 ml   Filed Weights   06/22/20 0124 06/22/20 0330 06/22/20 0422  Weight: 104.1 kg 104.1 kg 101.5 kg    Telemetry    Sinus rhythm with occasional PVCs and brief runs of NSVT - Personally Reviewed   Physical Exam   GEN: No acute distress.   Neck: No JVD, no carotid bruits Cardiac: RRR, no murmurs, rubs, or gallops. Right groin cath site is TTP though dressing C/D/I without underlying hematoma Respiratory: Clear to auscultation bilaterally, no wheezes/ rales/ rhonchi GI: NABS, Soft, nontender, non-distended  MS: 1-2+ LE edema; No deformity. Neuro:  Nonfocal, moving all extremities spontaneously Psych: Normal affect   Labs    Chemistry Recent Labs  Lab 06/17/20 0551 06/18/20 1543 06/19/20 0236 06/19/20 1038 06/20/20 0251 06/20/20 0251 06/21/20 1043 06/22/20 0725 06/23/20 0354  NA 137   < > 135  --  137   < > 138 138 137  K 5.3*   < > 5.7*   < > 4.5   < > 3.4* 3.2* 3.5  CL  106   < > 100  --  100   < > 96* 99 96*  CO2 24   < > 21*  --  25   < > 27 29 29   GLUCOSE 118*   < > 220*  --  138*   < > 221* 165* 138*  BUN 91*   < > 110*  --  106*   < > 103* 56* 58*  CREATININE 4.43*   < > 5.68*  --  5.60*   < > 5.72* 3.93* 4.21*  CALCIUM 8.4*   < > 8.8*  --  8.3*   < > 8.1* 8.1* 8.5*  PROT  --   --  5.5*  --   --   --   --   --   --   ALBUMIN 2.9*  --  2.8*  --  2.5*  --   --   --   --   AST  --   --  129*  --   --   --   --   --   --   ALT  --   --  211*  --   --   --   --   --   --   ALKPHOS  --   --  104  --   --   --   --   --   --   BILITOT  --   --  1.2  --   --   --   --   --   --   GFRNONAA 10*   < > 7*  --  8*   < > 7* 12* 11*  GFRAA 12*   < > 9*  --  9*  --  9*  --   --   ANIONGAP 7   < > 14  --  12   < > 15 10 12    < > = values in this interval not displayed.     Hematology Recent Labs  Lab  06/21/20 1043 06/22/20 0725 06/23/20 0354  WBC 8.0 7.7 8.6  RBC 2.59* 2.51* 2.44*  HGB 7.8* 7.7* 7.1*  HCT 23.9* 23.2* 22.6*  MCV 92.3 92.4 92.6  MCH 30.1 30.7 29.1  MCHC 32.6 33.2 31.4  RDW 15.3 14.9 15.5  PLT 106* 104* 118*    Cardiac EnzymesNo results for input(s): TROPONINI in the last 168 hours. No results for input(s): TROPIPOC in the last 168 hours.   BNP Recent Labs  Lab 06/18/20 1549  BNP 2,129.0*     DDimer No results for input(s): DDIMER in the last 168 hours.   Radiology    CARDIAC CATHETERIZATION  Result Date: 06/22/2020  Occlusion of the sequential saphenous vein graft to the obtuse marginal and PDA (left).  Patent saphenous vein graft to the mid RCA.  Patent ostial to proximal saphenous vein graft stent.  Patent stent in the mid RCA beyond the graft insertion site  Patent LIMA to mid LAD.  Ostial LIMA stent has 50 to 70% ISR.  Cath engagement causes damping of pressure.  Totally occluded proximal RCA  Functionally occluded left main with very faint opacification of the circumflex system despite the absence of a clear channel.  99% stenosis of the ostial to proximal circumflex stent.  Total occlusion of the proximal LAD.  Normal left ventricular end-diastolic pressure. RECOMMENDATIONS:  Severe native and bypass graft disease.  No obvious target for PCI this time.  Revascularization strategy would  likely include repeat coronary bypass surgery when left internal mammary ostial stent progresses.  IR Fluoro Guide CV Line Right  Result Date: 06/21/2020 INDICATION: End-stage renal disease. In need of durable access for the initiation of hemodialysis. EXAM: TUNNELED CENTRAL VENOUS HEMODIALYSIS CATHETER PLACEMENT WITH ULTRASOUND AND FLUOROSCOPIC GUIDANCE MEDICATIONS: Vancomycin 1 gm IV . The antibiotic was given in an appropriate time interval prior to skin puncture. ANESTHESIA/SEDATION: Moderate (conscious) sedation was employed during this procedure. A total of  Versed 1.5 mg and Fentanyl 550 mcg was administered intravenously. Moderate Sedation Time: 10 minutes. The patient's level of consciousness and vital signs were monitored continuously by radiology nursing throughout the procedure under my direct supervision. FLUOROSCOPY TIME:  18 seconds (5 mGy) COMPLICATIONS: None immediate. PROCEDURE: Informed written consent was obtained from the patient after a discussion of the risks, benefits, and alternatives to treatment. Questions regarding the procedure were encouraged and answered. The right neck and chest were prepped with chlorhexidine in a sterile fashion, and a sterile drape was applied covering the operative field. Maximum barrier sterile technique with sterile gowns and gloves were used for the procedure. A timeout was performed prior to the initiation of the procedure. After creating a small venotomy incision, a micropuncture kit was utilized to access the internal jugular vein. Real-time ultrasound guidance was utilized for vascular access including the acquisition of a permanent ultrasound image documenting patency of the accessed vessel. The microwire was utilized to measure appropriate catheter length. A stiff Glidewire was advanced to the level of the IVC and the micropuncture sheath was exchanged for a peel-away sheath. A palindrome tunneled hemodialysis catheter measuring 23 cm from tip to cuff was tunneled in a retrograde fashion from the anterior chest wall to the venotomy incision. The catheter was then placed through the peel-away sheath with tips ultimately positioned within the superior aspect of the right atrium. Final catheter positioning was confirmed and documented with a spot radiographic image. The catheter aspirates and flushes normally. The catheter was flushed with appropriate volume heparin dwells. The catheter exit site was secured with a 0-Prolene retention suture. The venotomy incision was closed with an interrupted 4-0 Vicryl, Dermabond  and Steri-strips. Dressings were applied. The patient tolerated the procedure well without immediate post procedural complication. IMPRESSION: Successful placement of 23 cm tip to cuff tunneled hemodialysis catheter via the right internal jugular vein with tips terminating within the superior aspect of the right atrium. The catheter is ready for immediate use. Electronically Signed   By: Sandi Mariscal M.D.   On: 06/21/2020 09:39   IR US Guide Vasc Access Right  Result Date: 06/21/2020 INDICATION: End-stage renal disease. In need of durable access for the initiation of hemodialysis. EXAM: TUNNELED CENTRAL VENOUS HEMODIALYSIS CATHETER PLACEMENT WITH ULTRASOUND AND FLUOROSCOPIC GUIDANCE MEDICATIONS: Vancomycin 1 gm IV . The antibiotic was given in an appropriate time interval prior to skin puncture. ANESTHESIA/SEDATION: Moderate (conscious) sedation was employed during this procedure. A total of Versed 1.5 mg and Fentanyl 550 mcg was administered intravenously. Moderate Sedation Time: 10 minutes. The patient's level of consciousness and vital signs were monitored continuously by radiology nursing throughout the procedure under my direct supervision. FLUOROSCOPY TIME:  18 seconds (5 mGy) COMPLICATIONS: None immediate. PROCEDURE: Informed written consent was obtained from the patient after a discussion of the risks, benefits, and alternatives to treatment. Questions regarding the procedure were encouraged and answered. The right neck and chest were prepped with chlorhexidine in a sterile fashion, and a sterile drape was applied covering  the operative field. Maximum barrier sterile technique with sterile gowns and gloves were used for the procedure. A timeout was performed prior to the initiation of the procedure. After creating a small venotomy incision, a micropuncture kit was utilized to access the internal jugular vein. Real-time ultrasound guidance was utilized for vascular access including the acquisition of a  permanent ultrasound image documenting patency of the accessed vessel. The microwire was utilized to measure appropriate catheter length. A stiff Glidewire was advanced to the level of the IVC and the micropuncture sheath was exchanged for a peel-away sheath. A palindrome tunneled hemodialysis catheter measuring 23 cm from tip to cuff was tunneled in a retrograde fashion from the anterior chest wall to the venotomy incision. The catheter was then placed through the peel-away sheath with tips ultimately positioned within the superior aspect of the right atrium. Final catheter positioning was confirmed and documented with a spot radiographic image. The catheter aspirates and flushes normally. The catheter was flushed with appropriate volume heparin dwells. The catheter exit site was secured with a 0-Prolene retention suture. The venotomy incision was closed with an interrupted 4-0 Vicryl, Dermabond and Steri-strips. Dressings were applied. The patient tolerated the procedure well without immediate post procedural complication. IMPRESSION: Successful placement of 23 cm tip to cuff tunneled hemodialysis catheter via the right internal jugular vein with tips terminating within the superior aspect of the right atrium. The catheter is ready for immediate use. Electronically Signed   By: Sandi Mariscal M.D.   On: 06/21/2020 09:39   VAS [place] UPPER EXT VEIN MAPPING (PRE-OP AVF)  Result Date: 06/22/2020 UPPER EXTREMITY VEIN MAPPING  Indications: Pre-access. Comparison Study: No prior studies. Performing Technologist: Oliver Hum RVT  Examination Guidelines: A complete evaluation includes B-mode imaging, spectral Doppler, color Doppler, and power Doppler as needed of all accessible portions of each vessel. Bilateral testing is considered an integral part of a complete examination. Limited examinations for reoccurring indications may be performed as noted. +-----------------+-------------+----------+---------+ Right Cephalic    Diameter (cm)Depth (cm)Findings  +-----------------+-------------+----------+---------+ Shoulder             0.43        2.00             +-----------------+-------------+----------+---------+ Prox upper arm       0.29        2.10             +-----------------+-------------+----------+---------+ Mid upper arm        0.25        1.30             +-----------------+-------------+----------+---------+ Dist upper arm       0.33        0.94             +-----------------+-------------+----------+---------+ Antecubital fossa    0.35        0.55   branching +-----------------+-------------+----------+---------+ Prox forearm         0.22        0.89             +-----------------+-------------+----------+---------+ Mid forearm          0.22        0.64             +-----------------+-------------+----------+---------+ Dist forearm         0.15        0.47             +-----------------+-------------+----------+---------+ +-----------------+-------------+----------+--------------+ Right Basilic    Diameter (  cm)Depth (cm)   Findings    +-----------------+-------------+----------+--------------+ Shoulder             0.47        1.34                  +-----------------+-------------+----------+--------------+ Mid upper arm        0.29        1.50                  +-----------------+-------------+----------+--------------+ Dist upper arm       0.18        1.21                  +-----------------+-------------+----------+--------------+ Antecubital fossa    0.16        1.30     branching    +-----------------+-------------+----------+--------------+ Prox forearm         0.11        0.51                  +-----------------+-------------+----------+--------------+ Mid forearm                             not visualized +-----------------+-------------+----------+--------------+ Distal forearm                          not visualized  +-----------------+-------------+----------+--------------+ +-----------------+-------------+----------+----------------+ Left Cephalic    Diameter (cm)Depth (cm)    Findings     +-----------------+-------------+----------+----------------+ Shoulder             0.40        1.70                    +-----------------+-------------+----------+----------------+ Prox upper arm       0.55        1.44                    +-----------------+-------------+----------+----------------+ Mid upper arm        0.34        1.10      branching     +-----------------+-------------+----------+----------------+ Dist upper arm       0.37        0.68                    +-----------------+-------------+----------+----------------+ Antecubital fossa    0.47        0.32   Partial thrombus +-----------------+-------------+----------+----------------+ Prox forearm         0.38        0.74                    +-----------------+-------------+----------+----------------+ Mid forearm          0.46        0.44                    +-----------------+-------------+----------+----------------+ Dist forearm         0.45        0.39   Partial thrombus +-----------------+-------------+----------+----------------+ +-----------------+-------------+----------+--------------+ Left Basilic     Diameter (cm)Depth (cm)   Findings    +-----------------+-------------+----------+--------------+ Shoulder             0.37        1.80                  +-----------------+-------------+----------+--------------+ Mid upper arm  0.42        1.40                  +-----------------+-------------+----------+--------------+ Dist upper arm       0.43        1.50                  +-----------------+-------------+----------+--------------+ Antecubital fossa    0.39        0.93                  +-----------------+-------------+----------+--------------+ Prox forearm         0.10        0.31                   +-----------------+-------------+----------+--------------+ Mid forearm                             not visualized +-----------------+-------------+----------+--------------+ Distal forearm                          not visualized +-----------------+-------------+----------+--------------+ *See table(s) above for measurements and observations.   Diagnosing physician: Servando Snare MD Electronically signed by Servando Snare MD on 06/22/2020 at 12:18:51 PM.    Final     Cardiac Studies   Echocardiogram 06/13/20: 1. Left ventricular ejection fraction, by estimation, is 55 to 60%. The  left ventricle has normal function. The left ventricle has no regional  wall motion abnormalities. There is mild concentric left ventricular  hypertrophy. Left ventricular diastolic  parameters are consistent with Grade II diastolic dysfunction  (pseudonormalization). Elevated left ventricular end-diastolic pressure.  2. Right ventricular systolic function is normal. The right ventricular  size is normal. There is normal pulmonary artery systolic pressure.  3. Left atrial size was mildly dilated.  4. Right atrial size was mildly dilated.  5. The mitral valve is normal in structure. Trivial mitral valve  regurgitation. No evidence of mitral stenosis.  6. The aortic valve is tricuspid. Aortic valve regurgitation is not  visualized. No aortic stenosis is present.  7. The inferior vena cava is normal in size with greater than 50%  respiratory variability, suggesting right atrial pressure of 3 mmHg.  Left heart catheterization 06/22/20:  Occlusion of the sequential saphenous vein graft to the obtuse marginal and PDA (left).  Patent saphenous vein graft to the mid RCA.  Patent ostial to proximal saphenous vein graft stent.  Patent stent in the mid RCA beyond the graft insertion site  Patent LIMA to mid LAD.  Ostial LIMA stent has 50 to 70% ISR.  Cath engagement causes damping of pressure.  Totally  occluded proximal RCA  Functionally occluded left main with very faint opacification of the circumflex system despite the absence of a clear channel.  99% stenosis of the ostial to proximal circumflex stent.  Total occlusion of the proximal LAD.  Normal left ventricular end-diastolic pressure.  RECOMMENDATIONS:   Severe native and bypass graft disease.  No obvious target for PCI this time.  Revascularization strategy would likely include repeat coronary bypass surgery when left internal mammary ostial stent progresses.   Patient Profile     64 y.o.femalewith hx of CAD s/p CABG in 2005 and multiple PCI's afterwards, HTN, DM, HLD, CKD stage IV presented 06/18/20 (one day after discharge) with weakness and confusion. Found to have bradycardia in 40s and hyperkalemia at 6.7 with Scr of 5.18.  PTCA to  SVG-OM1 in 01/2004,PTCA/DES to LM and prox LCxin 03/2004 and DES to SVG-OM1, PTCA/DES to SVG-OMin 01/2011,PTCA/DES to LM and LCxin 2015, andcutting balloon angioplasty to prox Cx and DES to SVG-RCAin 05/2017, NSTEMI in 03/2018 with DES to mid-RCA and DES to LIMA-LAD.  Most recently admitted to [facility] end of 05/2020 with NSTEMI. Hs troponin peaked at 160 at that time. Cath deferred due to advanced CKD. Treated with heparin for 48 hours. Remained on DAPT with ASA and plavix. Started on Ranexa.  Assessment & Plan    1. NSTEMI in patient with CAD s/p CABG in 2005 with subsequent PCI/DES to SVG-RCA (2018) and LIMA-LAD (2019): HsTrop peaked at 970 and trended down this admission. She had recurrent chest pressure which was relieved with a nitro gtt. Echocardiogram with EF 55-60%, no RWMA. LHC delayed due to progression of her CKD. Now s/p tunneled catheter placement and initiation of HD. She underwent a LHC yesterday which revealed 99% LM ISR with 50-60% stenosis of LIMA-LAD, occluded SVG-lPDA, severe native vessel disease, and patent SVG-dRCA. She was recommended for TCTS eval for redo CABG.  BBlocker held due to bradycardia on admit. Nitro gtt stopped overnight due to soft blood pressures. No recurrent chest pain.  Heparin gtt on hold given low Hgb.  - Await TCTS consult -sent a message to Levonne Spiller to confirm consult request - Continue ASA, plavix, and statin - Continue ranexa - Low threshold to restart nitro gtt for recurrent chest pain  2. Bradycardia: resolved with discontinuation of her BBlocker - Continue to monitor on telemetry  3. CKD stage 5: now with ESRD. S/p HD tunneled catheter placement 06/21/20.  - Continue HD per nephrology   4. Anemia/thrombocytopenia: Hgb down to 7.1 today from 7.7 yesterday and PLT stable at 118 this morning.  - Transfuse per primary team to maintain Hgb >8 given extensive CAD history - Continue aspirin and plavix for now  5. HLD: LDL 67 06/13/20 - Continue statin  For questions or updates, please contact [place] Please consult [email or web address] for contact info under Cardiology/STEMI.      Signed, Abigail Butts, PA-C  06/23/2020, 6:28 AM   [phone]  Agree with assessment and plan by Roby Lofts PA-C  Patient in with non-STEMI.  She has a long history of CAD status post remote CABG.  She was recently started on renal replacement therapy.  She was bradycardic and her beta-blocker was discontinued.  She underwent left heart cath by Dr. Tamala Julian yesterday revealing severe CAD with in-stent restenosis within her left main, occluded circumflex vein graft, in-stent restenosis with within her ostial LIMA stent.  Her LV function is preserved.  She is on dual antiplatelet therapy.  She is undergoing dialysis this morning.  She is pain-free.  I believe she will need repeat CABG.  In addition, her hemoglobin has slowly drifted down to 7.1 and I think she would benefit from having a hemoglobin above 9 and would recommend red blood cell transfusion.  Her exam is benign today.  We will wait T CTS's input.   Lorretta Harp, M.D.,  [place], [facility], [place] [address] [place], [place]  [id number]  [phone] 06/23/2020 11:07 AM

## 2020-06-24 DIAGNOSIS — I25110 Atherosclerotic heart disease of native coronary artery with unstable angina pectoris: Secondary | ICD-10-CM | POA: Diagnosis not present

## 2020-06-24 DIAGNOSIS — N186 End stage renal disease: Secondary | ICD-10-CM | POA: Diagnosis not present

## 2020-06-24 DIAGNOSIS — I70219 Atherosclerosis of native arteries of extremities with intermittent claudication, unspecified extremity: Secondary | ICD-10-CM | POA: Diagnosis not present

## 2020-06-24 DIAGNOSIS — R001 Bradycardia, unspecified: Secondary | ICD-10-CM | POA: Diagnosis not present

## 2020-06-24 LAB — GLUCOSE, CAPILLARY
Glucose-Capillary: 225 mg/dL — ABNORMAL HIGH (ref 70–99)
Glucose-Capillary: 215 mg/dL — ABNORMAL HIGH (ref 70–99)
Glucose-Capillary: 194 mg/dL — ABNORMAL HIGH (ref 70–99)
Glucose-Capillary: 151 mg/dL — ABNORMAL HIGH (ref 70–99)

## 2020-06-24 LAB — BASIC METABOLIC PANEL
Anion gap: 12 (ref 5–15)
BUN: 34 mg/dL — ABNORMAL HIGH (ref 8–23)
CO2: 29 mmol/L (ref 22–32)
Calcium: 8.5 mg/dL — ABNORMAL LOW (ref 8.9–10.3)
Chloride: 99 mmol/L (ref 98–111)
Creatinine, Ser: 3.77 mg/dL — ABNORMAL HIGH (ref 0.44–1.00)
GFR calc non Af Amer: 12 mL/min — ABNORMAL LOW (ref >60)
Glucose, Bld: 194 mg/dL — ABNORMAL HIGH (ref 70–99)
Potassium: 3.4 mmol/L — ABNORMAL LOW (ref 3.5–5.1)
Sodium: 140 mmol/L (ref 135–145)

## 2020-06-24 LAB — CBC
HCT: 23.4 % — ABNORMAL LOW (ref 36.0–46.0)
Hemoglobin: 7.5 g/dL — ABNORMAL LOW (ref 12.0–15.0)
MCH: 29.9 pg (ref 26.0–34.0)
MCHC: 32.1 g/dL (ref 30.0–36.0)
MCV: 93.2 fL (ref 80.0–100.0)
Platelets: 122 10*3/uL — ABNORMAL LOW (ref 150–400)
RBC: 2.51 MIL/uL — ABNORMAL LOW (ref 3.87–5.11)
RDW: 15.7 % — ABNORMAL HIGH (ref 11.5–15.5)
WBC: 9.6 10*3/uL (ref 4.0–10.5)
nRBC: 0 % (ref 0.0–0.2)

## 2020-06-24 LAB — PREPARE RBC (CROSSMATCH)

## 2020-06-24 MED ORDER — SODIUM CHLORIDE 0.9% IV SOLUTION: Freq: Once | INTRAVENOUS | Status: AC

## 2020-06-24 MED ORDER — POTASSIUM CHLORIDE CRYS ER 20 MEQ PO TBCR
20.0000 meq | EXTENDED_RELEASE_TABLET | Freq: Two times a day (BID) | ORAL | Status: DC
  Administered 2020-06-24 – 2020-06-26 (×5): 20 meq via ORAL
  Filled 2020-06-24 (×5): qty 1

## 2020-06-24 MED ORDER — DARBEPOETIN ALFA 100 MCG/0.5ML IJ SOSY
100.0000 ug | PREFILLED_SYRINGE | INTRAMUSCULAR | Status: DC
  Filled 2020-06-24: qty 0.5

## 2020-06-24 NOTE — Progress Notes (Signed)
Inpatient Diabetes Program Recommendations  AACE/ADA: New Consensus Statement on Inpatient Glycemic Control (2015)  Target Ranges:  Prepandial:   less than 140 mg/dL      Peak postprandial:   less than 180 mg/dL (1-2 hours)      Critically ill patients:  140 - 180 mg/dL   Lab Results  Component Value Date   GLUCAP 215 (H) 06/24/2020   HGBA1C 6.2 (H) 06/13/2020    Review of Glycemic Control Results for [name], [name] (MRN [id number]) as of 06/24/2020 14:12  Ref. Range 06/23/2020 12:42 06/23/2020 16:22 06/23/2020 21:17 06/24/2020 08:44 06/24/2020 12:00  Glucose-Capillary Latest Ref Range: 70 - 99 mg/dL 130 (H) 159 (H) 190 (H) 225 (H) 215 (H)   Diabetes history: DM 2 Outpatient Diabetes medications:  Novolin 70/30 24 units bid Current orders for Inpatient glycemic control:  Novolog sensitive tid with meals and HS Lantus 10 units daily Inpatient Diabetes Program Recommendations:    May consider slight increase of Lantus to 15 units daily.   Thanks, Adah Perl, RN, BC-ADM Inpatient Diabetes Coordinator Pager [phone] (8a-5p)

## 2020-06-24 NOTE — Progress Notes (Signed)
Progress Note  Patient Name: Donna Howe Date of Encounter: 06/24/2020  Primary Cardiologist: Rozann Lesches, MD   Subjective   Feeling okay this morning. Had a brief episode of chest pain following dialysis yesterday which was relieved with nitro. Also with some nausea yesterday which she thought may be coming from her multivitamin which she skipped last night. No nausea this morning. She has also been having pain in her right 1st toe c/w previous gout.   Inpatient Medications    Scheduled Meds: . ALPRAZolam  0.25 mg Oral BID  . ALPRAZolam  0.5 mg Oral QHS  . aspirin EC  81 mg Oral Daily  . atorvastatin  80 mg Oral QPM  . Chlorhexidine Gluconate Cloth  6 each Topical Q0600  . Chlorhexidine Gluconate Cloth  6 each Topical Q0600  . clopidogrel  75 mg Oral Daily  . feeding supplement (ENSURE ENLIVE)  237 mL Oral BID BM  . ferrous sulfate  325 mg Oral Q lunch  . heparin  5,000 Units Subcutaneous Q8H  . insulin aspart  0-5 Units Subcutaneous QHS  . insulin aspart  0-9 Units Subcutaneous TID WC  . insulin glargine  10 Units Subcutaneous Daily  . levothyroxine  125 mcg Oral Daily  . multivitamin  1 tablet Oral QHS  . pantoprazole  40 mg Oral BID  . sodium chloride flush  3 mL Intravenous Q12H  . sodium chloride flush  3 mL Intravenous Q12H  . torsemide  100 mg Oral Daily  . Vitamin D (Ergocalciferol)  50,000 Units Oral Q Mon   Continuous Infusions: . sodium chloride    . nitroGLYCERIN Stopped (06/23/20 0207)   PRN Meds: sodium chloride, acetaminophen **OR** [DISCONTINUED] acetaminophen, albuterol, morphine injection, nitroGLYCERIN, ondansetron (ZOFRAN) IV, sodium chloride flush   Vital Signs    Vitals:   06/24/20 0035 06/24/20 0400 06/24/20 0410 06/24/20 0500  BP: (!) 107/47 (!) 98/47 (!) 90/47 (!) 98/51  Pulse: 82 65  (!) 59  Resp: 16   18  Temp: 98.9 F (37.2 C)   98.4 F (36.9 C)  TempSrc: Oral   Oral  SpO2:  93%  99%  Weight:    98.6 kg  Height:         Intake/Output Summary (Last 24 hours) at 06/24/2020 0751 Last data filed at 06/24/2020 0500 Gross per 24 hour  Intake 720 ml  Output 2300 ml  Net -1580 ml   Filed Weights   06/23/20 0915 06/23/20 1157 06/24/20 0500  Weight: 99.7 kg 98.2 kg 98.6 kg    Telemetry    Sinus rhythm with occasional PVC - Personally Reviewed  ECG    No new tracings - Personally Reviewed  Physical Exam   GEN: No acute distress.   Neck: No JVD, no carotid bruits Cardiac: RRR, no murmurs, rubs, or gallops.  Respiratory: Clear to auscultation bilaterally, no wheezes/ rales/ rhonchi GI: NABS, Soft, obese, nontender, non-distended  MS: No edema; No deformity. Neuro:  Nonfocal, moving all extremities spontaneously Psych: Normal affect   Labs    Chemistry Recent Labs  Lab 06/19/20 0236 06/19/20 1038 06/20/20 0251 06/20/20 0251 06/21/20 1043 06/21/20 1043 06/22/20 0725 06/23/20 0354 06/24/20 0359  NA 135  --  137   < > 138   < > 138 137 140  K 5.7*   < > 4.5   < > 3.4*   < > 3.2* 3.5 3.4*  CL 100  --  100   < > 96*   < >  99 96* 99  CO2 21*  --  25   < > 27   < > 29 29 29   GLUCOSE 220*  --  138*   < > 221*   < > 165* 138* 194*  BUN 110*  --  106*   < > 103*   < > 56* 58* 34*  CREATININE 5.68*  --  5.60*   < > 5.72*   < > 3.93* 4.21* 3.77*  CALCIUM 8.8*  --  8.3*   < > 8.1*   < > 8.1* 8.5* 8.5*  PROT 5.5*  --   --   --   --   --   --   --   --   ALBUMIN 2.8*  --  2.5*  --   --   --   --   --   --   AST 129*  --   --   --   --   --   --   --   --   ALT 211*  --   --   --   --   --   --   --   --   ALKPHOS 104  --   --   --   --   --   --   --   --   BILITOT 1.2  --   --   --   --   --   --   --   --   GFRNONAA 7*  --  8*   < > 7*   < > 12* 11* 12*  GFRAA 9*  --  9*  --  9*  --   --   --   --   ANIONGAP 14  --  12   < > 15   < > 10 12 12    < > = values in this interval not displayed.     Hematology Recent Labs  Lab 06/22/20 0725 06/23/20 0354 06/24/20 0359  WBC 7.7 8.6 9.6  RBC  2.51* 2.44* 2.51*  HGB 7.7* 7.1* 7.5*  HCT 23.2* 22.6* 23.4*  MCV 92.4 92.6 93.2  MCH 30.7 29.1 29.9  MCHC 33.2 31.4 32.1  RDW 14.9 15.5 15.7*  PLT 104* 118* 122*    Cardiac EnzymesNo results for input(s): TROPONINI in the last 168 hours. No results for input(s): TROPIPOC in the last 168 hours.   BNP Recent Labs  Lab 06/18/20 1549  BNP 2,129.0*     DDimer No results for input(s): DDIMER in the last 168 hours.   Radiology    CARDIAC CATHETERIZATION  Result Date: 06/22/2020  Occlusion of the sequential saphenous vein graft to the obtuse marginal and PDA (left).  Patent saphenous vein graft to the mid RCA.  Patent ostial to proximal saphenous vein graft stent.  Patent stent in the mid RCA beyond the graft insertion site  Patent LIMA to mid LAD.  Ostial LIMA stent has 50 to 70% ISR.  Cath engagement causes damping of pressure.  Totally occluded proximal RCA  Functionally occluded left main with very faint opacification of the circumflex system despite the absence of a clear channel.  99% stenosis of the ostial to proximal circumflex stent.  Total occlusion of the proximal LAD.  Normal left ventricular end-diastolic pressure. RECOMMENDATIONS:  Severe native and bypass graft disease.  No obvious target for PCI this time.  Revascularization strategy would likely include repeat coronary bypass surgery when left internal mammary ostial  stent progresses.   Cardiac Studies   Echocardiogram 06/13/20: 1. Left ventricular ejection fraction, by estimation, is 55 to 60%. The  left ventricle has normal function. The left ventricle has no regional  wall motion abnormalities. There is mild concentric left ventricular  hypertrophy. Left ventricular diastolic  parameters are consistent with Grade II diastolic dysfunction  (pseudonormalization). Elevated left ventricular end-diastolic pressure.  2. Right ventricular systolic function is normal. The right ventricular  size is normal. There is  normal pulmonary artery systolic pressure.  3. Left atrial size was mildly dilated.  4. Right atrial size was mildly dilated.  5. The mitral valve is normal in structure. Trivial mitral valve  regurgitation. No evidence of mitral stenosis.  6. The aortic valve is tricuspid. Aortic valve regurgitation is not  visualized. No aortic stenosis is present.  7. The inferior vena cava is normal in size with greater than 50%  respiratory variability, suggesting right atrial pressure of 3 mmHg.  Left heart catheterization 06/22/20:  Occlusion of the sequential saphenous vein graft to the obtuse marginal and PDA (left).  Patent saphenous vein graft to the mid RCA. Patent ostial to proximal saphenous vein graft stent. Patent stent in the mid RCA beyond the graft insertion site  Patent LIMA to mid LAD. Ostial LIMA stent has 50 to 70% ISR. Cath engagement causes damping of pressure.  Totally occluded proximal RCA  Functionally occluded left main with very faint opacification of the circumflex system despite the absence of a clear channel.  99% stenosis of the ostial to proximal circumflex stent.  Total occlusion of the proximal LAD.  Normal left ventricular end-diastolic pressure.  RECOMMENDATIONS:   Severe native and bypass graft disease. No obvious target for PCI this time. Revascularization strategy would likely include repeat coronary bypass surgery when left internal mammary ostial stent progresses.  Patient Profile     64 y.o.femalewith hx of CAD s/p CABG in 2005 and multiple PCI's afterwards, HTN, DM, HLD, CKD stage IV presented 06/18/20 (one day after discharge) with weakness and confusion. Found to have bradycardia in 40s and hyperkalemia at 6.7 with Scr of 5.18.  PTCA to SVG-OM1 in 01/2004,PTCA/DES to LM and prox LCxin 03/2004 and DES to SVG-OM1, PTCA/DES to SVG-OMin 01/2011,PTCA/DES to LM and LCxin 2015, andcutting balloon angioplasty to prox Cx and DES to  SVG-RCAin 05/2017, NSTEMI in 03/2018 with DES to mid-RCA and DES to LIMA-LAD.  Most recently admitted to [facility] end of9/2021 with NSTEMI. Hs troponin peaked at 160 at that time. Cath deferred due to advanced CKD. Treated with heparin for 48 hours. Remained on DAPT with ASA and plavix. Started on Marcus    1. NSTEMI in patient with CAD s/p CABG in 2005 with subsequent PCI/DES to SVG-RCA (2018) and LIMA-LAD (2019):HsTrop peaked at 970 and trended down this admission. She had recurrent chest pressure which was relieved with a nitro gtt. Echocardiogram with EF 55-60%, no RWMA. LHC delayed due to progression of her CKD. Now s/p tunneled catheter placement and initiation of HD. She underwent a LHC yesterday which revealed 99% LM ISR with 50-60% stenosis of LIMA-LAD, occluded SVG-lPDA, severe native vessel disease, and patent SVG-dRCA. She was recommended for TCTS eval for redo CABG. Unfortunately she was felt to be high risk for redo CABG given comorbidities and concerns for poor target vessels. She was recommended for possible high-risk PCI. BBlocker held due to bradycardia on admit. Nitro gtt stopped overnight due to soft blood pressures. No recurrent chest  pain.  Heparin gtt on hold given low Hgb.  - Will discuss with MD potential PCI options for managing her severe CAD. - Continue ASA, plavix, and statin - Continue ranexa - Low threshold to restart nitro gtt for recurrent chest pain  2. Bradycardia:resolved with discontinuation of her BBlocker - Continue to monitor on telemetry  3. CKD stage 5:now with ESRD. S/p HD tunneled catheter placement 06/21/20.  - Continue HD per nephrology   4. Anemia/thrombocytopenia:Hgb stable at 7.5 today and PLT stable at 122 this morning.  - Transfuse per primary team to maintain Hgb >8 given extensive CAD history - Continue aspirin and plavix for now  5. HLD:LDL 67 06/13/20 - Continue statin  6. Gout: she reports pain in her 1st right  toe this morning c/w her gout. She reports she has not been receiving her home gout medicine. Home med list reviewed and do not see any gout medications listed.  - Will defer to primary team for clarification/management.    For questions or updates, please contact [place] Please consult [email or web address] for contact info under Cardiology/STEMI.      Signed, Abigail Butts, PA-C  06/24/2020, 7:51 AM   [phone]

## 2020-06-24 NOTE — Progress Notes (Signed)
[facility] Progress Note  Name: Donna Howe MRN: [id number] DOB: 1955/12/13  Chief Complaint:  fatigue  Subjective:  HD yesterday- UF 1.5L.  Had some CP similar but less severe than admission, resolved with SL NTG.     Intake/Output Summary (Last 24 hours) at 06/24/2020 1047 Last data filed at 06/24/2020 0500 Gross per 24 hour  Intake 720 ml  Output 2300 ml  Net -1580 ml    Vitals:  Vitals:   06/24/20 0410 06/24/20 0500 06/24/20 0835 06/24/20 0846  BP: (!) 90/47 (!) 98/51 (!) 91/56 (!) 91/56  Pulse:  (!) 59 60 (!) 59  Resp:  18  16  Temp:  98.4 F (36.9 C)  98.1 F (36.7 C)  TempSrc:  Oral  Oral  SpO2:  99%  93%  Weight:  98.6 kg    Height:         Physical Exam:  General: adult female in bed in NAD  HEENT: NCAT Eyes: EOMI sclera anicteric Neck: supple trachea midline Heart: S1S2 no rub Lungs: clear with normal WOB Abdomen: softly dist/obese habitus/nontender Extremities: 1+ edema - slightly improved. Skin: no rash on extremities exposed Psych normal mood and affect Neuro: conversant  Medications reviewed   Labs:  BMP Latest Ref Rng & Units 06/24/2020 06/23/2020 06/22/2020  Glucose 70 - 99 mg/dL 194(H) 138(H) 165(H)  BUN 8 - 23 mg/dL 34(H) 58(H) 56(H)  Creatinine 0.44 - 1.00 mg/dL 3.77(H) 4.21(H) 3.93(H)  Sodium 135 - 145 mmol/L 140 137 138  Potassium 3.5 - 5.1 mmol/L 3.4(L) 3.5 3.2(L)  Chloride 98 - 111 mmol/L 99 96(L) 99  CO2 22 - 32 mmol/L 29 29 29   Calcium 8.9 - 10.3 mg/dL 8.5(L) 8.5(L) 8.1(L)     Assessment/Plan:   # NSTEMI  - s/p LHC 10/5 with diffuse disease --> no CABG targets, cardiology to eval if any PCI options - noted cardiology has stopped beta blocker due to bradycardia - Had CP post HD yesterday resolved with SL NTG  # CKD stage V with progression to end-stage renal disease -s/p TDC 10/4, 1st HD 10/4, 2nd 10/6, plan next tomorrow - hold on permanent access in setting of NSTEMI but hopefully will be able to place  prior to d/c.  Vein mapping completed - She will need CLIP to outpatient HD unit - underway  # Hypokalemia: repleted today (had hyperkalemia earlier in course)  # Acute on chronic diastolic CHF  - cont po diuretics today --> UOP yesterday was 0.8L yesterday, much lower after LHC - small vol UF with HD as tolerated  # HTN  - BPs have been reasonable  # Anemia of CKD  - starting ESA - Transfuse as needed --> cardiology recommending transfusion - appears iron is ok (ferritin low but iron level and tsat ok)  Justin Mend, MD 06/24/2020 10:47 AM

## 2020-06-24 NOTE — Progress Notes (Signed)
PROGRESS NOTE    Donna Howe  VEL:[id number] DOB: [birth year] DOA: 06/18/2020 PCP: [facility]    Brief Narrative:  Patient admitted to the hospital with a working diagnosis of acute kidney injury on chronic kidney disease, complicated by hyperkalemia, uremia and bradycardia.  64 year old female with past medical history for chronic kidney disease stage V, type 2 diabetes mellitus, dyslipidemia, and hypertension.  Recent hospitalization at [facility] for non-STEMI.  Patient was readmitted today after discharge due to bradycardia.  At home patient had generalized weakness, fatigue, confusion and slurred speech.  On her initial physical examination her heart rate was 45 bpm, blood pressure 142/32, respiratory rate 18, oxygen saturation 90%.  Her lungs were clear to auscultation bilaterally, heart S1-S2, present, bradycardic, soft abdomen, positive bilateral lower extremity edema more right than left. Sodium 132, potassium 6.7, chloride 101, bicarb 20, glucose 306, BUN 106, creatinine 5.18, troponin I 482, BNP 2,129, white count 11.5, hemoglobin 9.2, hematocrit 27.5, platelets 88.  SARS COVID-19 negative. EKG 45 bpm, rightward axis, right bundle branch block, junctional rhythm, no significant ST segment changes, negative T wave V1 through V3.   Patient received medical therapy for hyperkalemia including Kayexalate, calcium gluconate, and insulin.  On 10/4 patient underwent placement of tunneled HD catheter in the right internal jugular and posteriorly underwent hemodialysis.   On 10/5 patient underwent coronary angiography showing diffuse and severe coronary artery disease, including native and graft vessels.  Positive in-stent restenosis LIMA to LAD.  Patient was referred for reevaluation for bypass grafting.  She had her first CABG approximately 16 years ago.   Assessment & Plan:   Principal Problem:   End stage renal disease ([place]) Active Problems:   Class 2  obesity   Bradycardia   Diabetes mellitus with renal manifestation (HCC)   Atherosclerosis of native artery of extremity with intermittent claudication (HCC)   CAD (coronary artery disease)   Carotid artery disease (HCC)   Anemia   Chronic diastolic HF (heart failure) (HCC)   Hyperkalemia   1. Hyperkalemia due to progression of CKD to end stage. Edema lower extremities improved.   Plan for HD in am, will continue close follow up electrolytes and volume status, continue torsemide.  Will check with nephrology, placement of permament access before discharge.   Hypokalemia, continue K correction with Kcl 20 meq bid.   2. Severe coronary artery disease with recent NSTEMI/ acute on chronic diastolic heart failure.  Echocardiogram with preserved LV systolic function 55 to 25%.     Agressive medical therapy with aspirin, clopidogrel and atorvastatin. Not on B blocker due to risk of bradycardia and not  on ace inh due to risk of recurrent hyperkalemia.  Continue with torsemide. Follow with further cardiology recommendations.    3. HTN.  systolic blood pressure 91 to 94 mmHg, continue close monitoring. Will add prbc transfusion in am during HD.   4. Controlled T2DM. Fasting glucose this am 194, will continue with insulin sliding scale and basal insulin 10 units glargine.   5. Anemia of chronic renal disease/ iron deficiency. hgb at 7.5 with hct at 23.4. Continue with oral iron. Plan to target Hgb close to 8, in the setting of critical coronary artery disease, transfuse one unit prbc on HD in am.    6. Anxiety. On alprazolam as needed and qhs.   7. Hypothyroid. on levothyroxine.    Status is: Inpatient  Remains inpatient appropriate because:IV treatments appropriate due to intensity of illness or inability to  take PO and Inpatient level of care appropriate due to severity of illness   Dispo: The patient is from: Home              Anticipated d/c is to: Home               Anticipated d/c date is: 3 days              Patient currently is not medically stable to d/c. pending permanent access for HD before discharge.     DVT prophylaxis: Heparin   Code Status:   full  Family Communication:  No family at the bedside      Nutrition Status: Nutrition Problem: Inadequate oral intake Etiology: nausea, decreased appetite Signs/Symptoms: per patient/family report Interventions: Ensure Enlive (each supplement provides 350kcal and 20 grams of protein), MVI, Education    Consultants:   Cardiology   Nephrology   Procedures:   HD catheter  Cardiac catheterization     Subjective: Patient is feeling weak and deconditioned, no nausea or vomiting, no chest pain, edema of lower extremities had improved, poor oral intake.   Objective: Vitals:   06/24/20 0500 06/24/20 0835 06/24/20 0846 06/24/20 1105  BP: (!) 98/51 (!) 91/56 (!) 91/56 (!) 94/51  Pulse: (!) 59 60 (!) 59 60  Resp: 18  16 16   Temp: 98.4 F (36.9 C)  98.1 F (36.7 C) 98.4 F (36.9 C)  TempSrc: Oral  Oral Oral  SpO2: 99%  93% 98%  Weight: 98.6 kg     Height:        Intake/Output Summary (Last 24 hours) at 06/24/2020 1542 Last data filed at 06/24/2020 1408 Gross per 24 hour  Intake 820 ml  Output 1200 ml  Net -380 ml   Filed Weights   06/23/20 0915 06/23/20 1157 06/24/20 0500  Weight: 99.7 kg 98.2 kg 98.6 kg    Examination:   General: Not in pain or dyspnea deconditioned  Neurology: Awake and alert, non focal  E ENT: mild pallor, no icterus, oral mucosa moist Cardiovascular: No JVD. S1-S2 present, rhythmic, no gallops, rubs, or murmurs. ++ pitting bilateral lower extremity edema. Pulmonary: vesicular breath sounds bilaterally, adequate air movement, no wheezing, rhonchi or rales. Gastrointestinal. Abdomen soft and non tender Skin. No rashes Musculoskeletal: no joint deformities     Data Reviewed: I have personally reviewed following labs and imaging  studies  CBC: Recent Labs  Lab 06/18/20 1543 06/19/20 0236 06/20/20 0929 06/21/20 1043 06/22/20 0725 06/23/20 0354 06/24/20 0359  WBC 11.5*   < > 9.0 8.0 7.7 8.6 9.6  NEUTROABS 9.7*  --   --   --   --   --   --   HGB 9.2*   < > 7.4* 7.8* 7.7* 7.1* 7.5*  HCT 27.5*   < > 23.2* 23.9* 23.2* 22.6* 23.4*  MCV 91.7   < > 92.1 92.3 92.4 92.6 93.2  PLT 88*   < > 94* 106* 104* 118* 122*   < > = values in this interval not displayed.   Basic Metabolic Panel: Recent Labs  Lab 06/18/20 1543 06/18/20 1726 06/20/20 0251 06/21/20 1043 06/22/20 0725 06/23/20 0354 06/24/20 0359  NA 132*   < > 137 138 138 137 140  K 6.7*   < > 4.5 3.4* 3.2* 3.5 3.4*  CL 101   < > 100 96* 99 96* 99  CO2 20*   < > 25 27 29 29 29   GLUCOSE 306*   < >  138* 221* 165* 138* 194*  BUN 106*   < > 106* 103* 56* 58* 34*  CREATININE 5.18*   < > 5.60* 5.72* 3.93* 4.21* 3.77*  CALCIUM 8.6*   < > 8.3* 8.1* 8.1* 8.5* 8.5*  MG 2.6*  --   --   --   --   --   --   PHOS  --   --  5.7*  --   --   --   --    < > = values in this interval not displayed.   GFR: Estimated Creatinine Clearance: 18.6 mL/min (A) (by C-G formula based on SCr of 3.77 mg/dL (H)). Liver Function Tests: Recent Labs  Lab 06/19/20 0236 06/20/20 0251  AST 129*  --   ALT 211*  --   ALKPHOS 104  --   BILITOT 1.2  --   PROT 5.5*  --   ALBUMIN 2.8* 2.5*   No results for input(s): LIPASE, AMYLASE in the last 168 hours. No results for input(s): AMMONIA in the last 168 hours. Coagulation Profile: No results for input(s): INR, PROTIME in the last 168 hours. Cardiac Enzymes: No results for input(s): CKTOTAL, CKMB, CKMBINDEX, TROPONINI in the last 168 hours. BNP (last 3 results) No results for input(s): PROBNP in the last 8760 hours. HbA1C: No results for input(s): HGBA1C in the last 72 hours. CBG: Recent Labs  Lab 06/23/20 1242 06/23/20 1622 06/23/20 2117 06/24/20 0844 06/24/20 1200  GLUCAP 130* 159* 190* 225* 215*   Lipid Profile: No  results for input(s): CHOL, HDL, LDLCALC, TRIG, CHOLHDL, LDLDIRECT in the last 72 hours. Thyroid Function Tests: No results for input(s): TSH, T4TOTAL, FREET4, T3FREE, THYROIDAB in the last 72 hours. Anemia Panel: No results for input(s): VITAMINB12, FOLATE, FERRITIN, TIBC, IRON, RETICCTPCT in the last 72 hours.    Radiology Studies: I have reviewed all of the imaging during this hospital visit personally     Scheduled Meds: . ALPRAZolam  0.25 mg Oral BID  . ALPRAZolam  0.5 mg Oral QHS  . aspirin EC  81 mg Oral Daily  . atorvastatin  80 mg Oral QPM  . Chlorhexidine Gluconate Cloth  6 each Topical Q0600  . Chlorhexidine Gluconate Cloth  6 each Topical Q0600  . clopidogrel  75 mg Oral Daily  . [START ON 06/25/2020] darbepoetin (ARANESP) injection - DIALYSIS  100 mcg Intravenous Q Fri-HD  . feeding supplement (ENSURE ENLIVE)  237 mL Oral BID BM  . ferrous sulfate  325 mg Oral Q lunch  . heparin  5,000 Units Subcutaneous Q8H  . insulin aspart  0-5 Units Subcutaneous QHS  . insulin aspart  0-9 Units Subcutaneous TID WC  . insulin glargine  10 Units Subcutaneous Daily  . levothyroxine  125 mcg Oral Daily  . multivitamin  1 tablet Oral QHS  . pantoprazole  40 mg Oral BID  . potassium chloride  20 mEq Oral BID  . sodium chloride flush  3 mL Intravenous Q12H  . sodium chloride flush  3 mL Intravenous Q12H  . torsemide  100 mg Oral Daily  . Vitamin D (Ergocalciferol)  50,000 Units Oral Q Mon   Continuous Infusions: . sodium chloride    . nitroGLYCERIN Stopped (06/23/20 0207)     LOS: 6 days        Jeanise Durfey Gerome Apley, MD

## 2020-06-24 NOTE — Progress Notes (Addendum)
Patient has been accepted at [facility] on a TTS schedule with a seat time of 12:30pm. She needs to arrive at 12:10pm. If her first day in the clinic falls on a Tuesday or Thursday, patient needs to arrive at 11:30am to complete intake paperwork prior to her first treatment. If patient's first day in the clinic falls on a Saturday, patient needs to report to the clinic the Friday prior to complete paperwork in order to have her first treatment the following day. Navigator awaiting discharge plans and will continue to follow closely.  Alphonzo Cruise, [place] [facility] Navigator [phone]

## 2020-06-25 DIAGNOSIS — I5032 Chronic diastolic (congestive) heart failure: Secondary | ICD-10-CM | POA: Diagnosis not present

## 2020-06-25 DIAGNOSIS — I6521 Occlusion and stenosis of right carotid artery: Secondary | ICD-10-CM

## 2020-06-25 DIAGNOSIS — I25110 Atherosclerotic heart disease of native coronary artery with unstable angina pectoris: Secondary | ICD-10-CM | POA: Diagnosis not present

## 2020-06-25 DIAGNOSIS — I214 Non-ST elevation (NSTEMI) myocardial infarction: Secondary | ICD-10-CM

## 2020-06-25 DIAGNOSIS — N186 End stage renal disease: Secondary | ICD-10-CM | POA: Diagnosis not present

## 2020-06-25 DIAGNOSIS — R001 Bradycardia, unspecified: Secondary | ICD-10-CM | POA: Diagnosis not present

## 2020-06-25 DIAGNOSIS — N185 Chronic kidney disease, stage 5: Secondary | ICD-10-CM

## 2020-06-25 DIAGNOSIS — I70219 Atherosclerosis of native arteries of extremities with intermittent claudication, unspecified extremity: Secondary | ICD-10-CM | POA: Diagnosis not present

## 2020-06-25 LAB — GLUCOSE, CAPILLARY
Glucose-Capillary: 188 mg/dL — ABNORMAL HIGH (ref 70–99)
Glucose-Capillary: 259 mg/dL — ABNORMAL HIGH (ref 70–99)
Glucose-Capillary: 177 mg/dL — ABNORMAL HIGH (ref 70–99)
Glucose-Capillary: 168 mg/dL — ABNORMAL HIGH (ref 70–99)

## 2020-06-25 LAB — BASIC METABOLIC PANEL
Anion gap: 11 (ref 5–15)
BUN: 42 mg/dL — ABNORMAL HIGH (ref 8–23)
CO2: 28 mmol/L (ref 22–32)
Calcium: 8.4 mg/dL — ABNORMAL LOW (ref 8.9–10.3)
Chloride: 98 mmol/L (ref 98–111)
Creatinine, Ser: 4.74 mg/dL — ABNORMAL HIGH (ref 0.44–1.00)
GFR calc non Af Amer: 9 mL/min — ABNORMAL LOW (ref >60)
Glucose, Bld: 197 mg/dL — ABNORMAL HIGH (ref 70–99)
Potassium: 3.7 mmol/L (ref 3.5–5.1)
Sodium: 137 mmol/L (ref 135–145)

## 2020-06-25 LAB — CBC WITH DIFFERENTIAL/PLATELET
Abs Immature Granulocytes: 0.12 10*3/uL — ABNORMAL HIGH (ref 0.00–0.07)
Basophils Absolute: 0 10*3/uL (ref 0.0–0.1)
Basophils Relative: 0 %
Eosinophils Absolute: 0.2 10*3/uL (ref 0.0–0.5)
Eosinophils Relative: 2 %
HCT: 23.1 % — ABNORMAL LOW (ref 36.0–46.0)
Hemoglobin: 7.4 g/dL — ABNORMAL LOW (ref 12.0–15.0)
Immature Granulocytes: 1 %
Lymphocytes Relative: 20 %
Lymphs Abs: 1.9 10*3/uL (ref 0.7–4.0)
MCH: 30 pg (ref 26.0–34.0)
MCHC: 32 g/dL (ref 30.0–36.0)
MCV: 93.5 fL (ref 80.0–100.0)
Monocytes Absolute: 1.3 10*3/uL — ABNORMAL HIGH (ref 0.1–1.0)
Monocytes Relative: 14 %
Neutro Abs: 6.3 10*3/uL (ref 1.7–7.7)
Neutrophils Relative %: 63 %
Platelets: 127 10*3/uL — ABNORMAL LOW (ref 150–400)
RBC: 2.47 MIL/uL — ABNORMAL LOW (ref 3.87–5.11)
RDW: 15.4 % (ref 11.5–15.5)
WBC: 9.9 10*3/uL (ref 4.0–10.5)
nRBC: 0 % (ref 0.0–0.2)

## 2020-06-25 MED ORDER — DARBEPOETIN ALFA 100 MCG/0.5ML IJ SOSY
PREFILLED_SYRINGE | INTRAMUSCULAR | Status: AC
  Administered 2020-06-25: 100 ug via INTRAVENOUS
  Filled 2020-06-25: qty 0.5

## 2020-06-25 MED ORDER — HEPARIN SODIUM (PORCINE) 1000 UNIT/ML IJ SOLN
INTRAMUSCULAR | Status: AC
  Administered 2020-06-25: 3800 [IU]
  Filled 2020-06-25: qty 4

## 2020-06-25 NOTE — Progress Notes (Addendum)
Progress Note  Patient Name: Donna Howe Date of Encounter: 06/25/2020  Primary Cardiologist: Rozann Lesches, MD   Subjective   Feeling fine this morning. No recurrent chest pain. She reports weakness in her legs which she attributes to deconditioning. No SOB or palpitations. She states she would like to get her fistula placed this admission if possible.   Inpatient Medications    Scheduled Meds: . ALPRAZolam  0.25 mg Oral BID  . ALPRAZolam  0.5 mg Oral QHS  . aspirin EC  81 mg Oral Daily  . atorvastatin  80 mg Oral QPM  . Chlorhexidine Gluconate Cloth  6 each Topical Q0600  . Chlorhexidine Gluconate Cloth  6 each Topical Q0600  . clopidogrel  75 mg Oral Daily  . darbepoetin (ARANESP) injection - DIALYSIS  100 mcg Intravenous Q Fri-HD  . feeding supplement (ENSURE ENLIVE)  237 mL Oral BID BM  . heparin  5,000 Units Subcutaneous Q8H  . insulin aspart  0-5 Units Subcutaneous QHS  . insulin aspart  0-9 Units Subcutaneous TID WC  . insulin glargine  10 Units Subcutaneous Daily  . levothyroxine  125 mcg Oral Daily  . multivitamin  1 tablet Oral QHS  . pantoprazole  40 mg Oral BID  . potassium chloride  20 mEq Oral BID  . sodium chloride flush  3 mL Intravenous Q12H  . sodium chloride flush  3 mL Intravenous Q12H  . torsemide  100 mg Oral Daily  . Vitamin D (Ergocalciferol)  50,000 Units Oral Q Mon   Continuous Infusions: . sodium chloride    . nitroGLYCERIN Stopped (06/23/20 0207)   PRN Meds: sodium chloride, acetaminophen **OR** [DISCONTINUED] acetaminophen, albuterol, morphine injection, nitroGLYCERIN, ondansetron (ZOFRAN) IV, sodium chloride flush   Vital Signs    Vitals:   06/24/20 1947 06/25/20 0012 06/25/20 0635 06/25/20 0818  BP: (!) 103/51 115/60 (!) 124/58 100/60  Pulse:   70 66  Resp: 18 18 18 17   Temp: 98 F (36.7 C) 98.4 F (36.9 C) 98 F (36.7 C) 98.8 F (37.1 C)  TempSrc: Oral Oral  Oral  SpO2:   100% 91%  Weight:   98.9 kg   Height:         Intake/Output Summary (Last 24 hours) at 06/25/2020 0842 Last data filed at 06/25/2020 0600 Gross per 24 hour  Intake 940 ml  Output 1400 ml  Net -460 ml   Filed Weights   06/23/20 1157 06/24/20 0500 06/25/20 0635  Weight: 98.2 kg 98.6 kg 98.9 kg    Telemetry    Sinus rhythm - Personally Reviewed  ECG    No new tracings - Personally Reviewed  Physical Exam   GEN: No acute distress.   Neck: No JVD, no carotid bruits Cardiac: RRR, no murmurs, rubs, or gallops.  Respiratory: Clear to auscultation bilaterally, no wheezes/ rales/ rhonchi GI: NABS, Soft, obese, nontender, non-distended  MS: No edema; No deformity. Neuro:  Nonfocal, moving all extremities spontaneously Psych: Normal affect   Labs    Chemistry Recent Labs  Lab 06/19/20 0236 06/19/20 1038 06/20/20 0251 06/20/20 0251 06/21/20 1043 06/22/20 0725 06/23/20 0354 06/24/20 0359 06/25/20 0135  NA 135  --  137   < > 138   < > 137 140 137  K 5.7*   < > 4.5   < > 3.4*   < > 3.5 3.4* 3.7  CL 100  --  100   < > 96*   < > 96* 99 98  CO2 21*  --  25   < > 27   < > 29 29 28   GLUCOSE 220*  --  138*   < > 221*   < > 138* 194* 197*  BUN 110*  --  106*   < > 103*   < > 58* 34* 42*  CREATININE 5.68*  --  5.60*   < > 5.72*   < > 4.21* 3.77* 4.74*  CALCIUM 8.8*  --  8.3*   < > 8.1*   < > 8.5* 8.5* 8.4*  PROT 5.5*  --   --   --   --   --   --   --   --   ALBUMIN 2.8*  --  2.5*  --   --   --   --   --   --   AST 129*  --   --   --   --   --   --   --   --   ALT 211*  --   --   --   --   --   --   --   --   ALKPHOS 104  --   --   --   --   --   --   --   --   BILITOT 1.2  --   --   --   --   --   --   --   --   GFRNONAA 7*  --  8*   < > 7*   < > 11* 12* 9*  GFRAA 9*  --  9*  --  9*  --   --   --   --   ANIONGAP 14  --  12   < > 15   < > 12 12 11    < > = values in this interval not displayed.     Hematology Recent Labs  Lab 06/23/20 0354 06/24/20 0359 06/25/20 0135  WBC 8.6 9.6 9.9  RBC 2.44* 2.51* 2.47*  HGB  7.1* 7.5* 7.4*  HCT 22.6* 23.4* 23.1*  MCV 92.6 93.2 93.5  MCH 29.1 29.9 30.0  MCHC 31.4 32.1 32.0  RDW 15.5 15.7* 15.4  PLT 118* 122* 127*    Cardiac EnzymesNo results for input(s): TROPONINI in the last 168 hours. No results for input(s): TROPIPOC in the last 168 hours.   BNP Recent Labs  Lab 06/18/20 1549  BNP 2,129.0*     DDimer No results for input(s): DDIMER in the last 168 hours.   Radiology    No results found.  Cardiac Studies   Echocardiogram 06/13/20: 1. Left ventricular ejection fraction, by estimation, is 55 to 60%. The  left ventricle has normal function. The left ventricle has no regional  wall motion abnormalities. There is mild concentric left ventricular  hypertrophy. Left ventricular diastolic  parameters are consistent with Grade II diastolic dysfunction  (pseudonormalization). Elevated left ventricular end-diastolic pressure.  2. Right ventricular systolic function is normal. The right ventricular  size is normal. There is normal pulmonary artery systolic pressure.  3. Left atrial size was mildly dilated.  4. Right atrial size was mildly dilated.  5. The mitral valve is normal in structure. Trivial mitral valve  regurgitation. No evidence of mitral stenosis.  6. The aortic valve is tricuspid. Aortic valve regurgitation is not  visualized. No aortic stenosis is present.  7. The inferior vena cava is normal in size with greater than 50%  respiratory variability, suggesting right atrial pressure of 3 mmHg.  Left heart catheterization 06/22/20:  Occlusion of the sequential saphenous vein graft to the obtuse marginal and PDA (left).  Patent saphenous vein graft to the mid RCA. Patent ostial to proximal saphenous vein graft stent. Patent stent in the mid RCA beyond the graft insertion site  Patent LIMA to mid LAD. Ostial LIMA stent has 50 to 70% ISR. Cath engagement causes damping of pressure.  Totally occluded proximal RCA  Functionally  occluded left main with very faint opacification of the circumflex system despite the absence of a clear channel.  99% stenosis of the ostial to proximal circumflex stent.  Total occlusion of the proximal LAD.  Normal left ventricular end-diastolic pressure.  RECOMMENDATIONS:   Severe native and bypass graft disease. No obvious target for PCI this time. Revascularization strategy would likely include repeat coronary bypass surgery when left internal mammary ostial stent progresses.  Patient Profile     64 y.o.femalewith hx of CAD s/p CABG in 2005 and multiple PCI's afterwards, HTN, DM, HLD, CKD stage IV presented 06/18/20 (one day after discharge) with weakness and confusion. Found to have bradycardia in 40s and hyperkalemia at 6.7 with Scr of 5.18.  PTCA to SVG-OM1 in 01/2004,PTCA/DES to LM and prox LCxin 03/2004 and DES to SVG-OM1, PTCA/DES to SVG-OMin 01/2011,PTCA/DES to LM and LCxin 2015, andcutting balloon angioplasty to prox Cx and DES to SVG-RCAin 05/2017, NSTEMI in 03/2018 with DES to mid-RCA and DES to LIMA-LAD.  Most recently admitted to [facility] end of9/2021 with NSTEMI. Hs troponin peaked at 160 at that time. Cath deferred due to advanced CKD. Treated with heparin for 48 hours. Remained on DAPT with ASA and plavix. Started on [place]    1. NSTEMI in patient with CAD s/p CABG in 2005 with subsequent PCI/DES to SVG-RCA (2018) and LIMA-LAD (2019):HsTrop peaked at 970 and trended down this admission. She had recurrent chest pressure which was relieved with a nitro gtt. Echocardiogram with EF 55-60%, no RWMA. LHC delayed due to progression of her CKD.Now s/p tunneled catheter placement and initiation of HD.She underwent a LHC yesterday which revealed 99% LM ISR with 50-60% stenosis of LIMA-LAD, occluded SVG-lPDA, severe native vessel disease, and patent SVG-dRCA. She was recommended for TCTS eval for redo CABG.Unfortunately she was felt to be high risk  for redo CABG given comorbidities and concerns for poor target vessels. She was recommended for possible high-risk PCI. BBlocker held due to bradycardia on admit. Soft blood pressures have limited addition of antianginal medications. - Reviewed cath films with Dr. Tamala Julian and Irish Lack (who placed her previous stents) - possible intervention option to the proximal LAD, but may not be that beneficial. Recommended to optimize medications - Continue ASA, plavix, and statin - Continue prn SL nitro for recurrent chest pain - Consider addition of imdur if room in BP  2. Bradycardia:resolved with discontinuation of her BBlocker - Continue to monitor on telemetry  3. CKD stage 5:now with ESRD. S/p HD tunneled catheter placement 06/21/20. - Continue HD per nephrology   4. Anemia/thrombocytopenia:Hgbstable at 7.4 today and PLT stable at 127this morning.  - Transfuse 1U PRBC at dialysis to Hgb >8 given chest pain and ischemic substrate - Continue aspirin and plavix for now  5. HLD:LDL 67 06/13/20 - Continue statin  6. Weakness: likely 2/2 deconditioning.  - Will place PT orders  [facility] will sign off. No further recommendations. Medication Recommendations:  Continue current meds Other recommendations (labs,  testing, etc):  none Follow up as an outpatient:  [facility]) - has appt with Cecilie Kicks, NP on 10/14, but ultimately follow-up in [place].  For questions or updates, please contact [place] Please consult [email or web address] for contact info under Cardiology/STEMI.   Pixie Casino, MD, [facility], [place] Director of the Advanced Lipid Disorders &  Cardiovascular Risk Reduction Clinic Diplomate of the American Board of Clinical Lipidology Attending Cardiologist  Direct Dial: [phone]  Fax: [phone]  Website:  [email or web address]

## 2020-06-25 NOTE — Care Management Important Message (Signed)
Important Message  Patient Details  Name: Donna Howe MRN: [id number] Date of Birth: [birth year]   Medicare Important Message Given:  Yes     Shelda Altes 06/25/2020, 8:58 AM

## 2020-06-25 NOTE — Progress Notes (Signed)
Renal Navigator met with patient to discuss her outpatient HD clinic information, though she is not yet ready for discharge. She understands this. She seems discouraged and Navigator asked how she is feeling. She asked Navigator to "pull up a chair." Navigator sat with her and provided space for her to share her story as well as supportive brief counseling as she began to process all she has been through during this hospitalization.  Patient began to cry as she talked about how busy she was until she lost her boyfriend of 17 years in March of 2019 and then her mother in June of 2019. She shared that her mother was 75 and too unstable for permanent access surgery when starting dialysis that she decided she just wanted comfort care. She joked with Navigator to leave because the conversation was making her cry. Navigator offered to leave, but she wanted Navigator to stay. Navigator encouraged her to talk about her feelings and ensured that she knew that it is ok to cry and show emotion. Patient spoke about her son, her only child and "mama's boy" and how she wants to talk about plans for when she is nearing end of life, but he won't. She says she feels like she puts "too much pressure on him" to do so, but that it is important to her. Navigator suggests that she cannot force this conversation, but perhaps she can write down her wishes and the plans she has made for after her death in a letter to him. She says, "he won't take it from me." Navigator told her that it might be something she leaves for him for after she is gone. She will think about this. She states her parents had everything arranged for their funerals/burrials and it made things so much easier to deal with their deaths.  Patient stated appreciation for Renal Navigator's visit. Navigator will follow up as desired and when discharge is nearing.   Alphonzo Cruise, [employer] Renal Navigator [phone]

## 2020-06-25 NOTE — TOC Initial Note (Signed)
Transition of Care [facility]) - Initial/Assessment Note    Patient Details  Name: Donna Howe MRN: [id number] Date of Birth: 12/17/1955  [facility]) CM/SW Contact:    Bethena Roys, RN Phone Number: 06/25/2020, 12:34 PM  Clinical Narrative: Risk for readmission assessment completed. Patient is from home alone. Has support of one son; however, he works double shifts. Patient states she will be driving herself to HD. Case Manager has some concerns, asked patient if she has additional family support that she could reach out to for additional transportation options. Patient states she will call her Aunt that lives in [place] to see if she could also assist with transportation. Patient was agreeable to have Case Manager call [employer]- for cost options. Round trip quote is $75.00 private pay. Case Manager will make the patient aware of cost. Patient is on a limited income and this cost is probably too expensive. Patient does not have Medicaid at this time. Case Manager will continue to follow for additional transition of care needs.                  Expected Discharge Plan: Home/Self Care Barriers to Discharge: Continued Medical Work up   Patient Goals and CMS Choice Patient states their goals for this hospitalization and ongoing recovery are:: to return home   Choice offered to / list presented to : NA  Expected Discharge Plan and Services Expected Discharge Plan: Home/Self Care In-house Referral: NA Discharge Planning Services: CM Consult Post Acute Care Choice: NA Living arrangements for the past 2 months: Mobile Home Expected Discharge Date: 06/22/20               DME Arranged: N/A         HH Arranged: NA HH Agency: NA        Prior Living Arrangements/Services Living arrangements for the past 2 months: Mobile Home Lives with:: Self Patient language and need for interpreter reviewed:: Yes        Need for Family Participation in  Patient Care: Yes (Comment) Care giver support system in place?: Yes (comment)   Criminal Activity/Legal Involvement Pertinent to Current Situation/Hospitalization: No - Comment as needed  Activities of Daily Living Home Assistive Devices/Equipment: Blood pressure cuff, CBG Meter, Walker (specify type) ADL Screening (condition at time of admission) Patient's cognitive ability adequate to safely complete daily activities?: Yes Is the patient deaf or have difficulty hearing?: No Does the patient have difficulty seeing, even when wearing glasses/contacts?: No Does the patient have difficulty concentrating, remembering, or making decisions?: No Patient able to express need for assistance with ADLs?: Yes Does the patient have difficulty dressing or bathing?: No Independently performs ADLs?: Yes (appropriate for developmental age) Does the patient have difficulty walking or climbing stairs?: Yes Weakness of Legs: Both Weakness of Arms/Hands: Both  Permission Sought/Granted Permission sought to share information with : Family Supports, Case Manager                Emotional Assessment Appearance:: Appears stated age Attitude/Demeanor/Rapport: Engaged Affect (typically observed): Appropriate Orientation: : Oriented to Self, Oriented to Place, Oriented to  Time, Oriented to Situation Alcohol / Substance Use: Not Applicable Psych Involvement: No (comment)  Admission diagnosis:  Edema [R60.9] Hyperkalemia [E87.5] SOB (shortness of breath) [R06.02] General weakness [R53.1] Symptomatic bradycardia [R00.1] Acute renal failure, unspecified acute renal failure type [facility]) [N17.9] Patient Active Problem List   Diagnosis Date Noted   End stage renal disease ([place])  SOB (shortness of breath)    Hyperkalemia 06/18/2020   Chronic diastolic HF (heart failure) (HCC)    Gastroesophageal reflux disease    Acute renal failure superimposed on stage 5 chronic kidney disease, not on chronic  dialysis ([place]) 06/13/2020   Uncontrolled type 2 diabetes mellitus with hyperglycemia, with long-term current use of insulin ([place]) 06/13/2020   Pneumonia 12/17/2019   Cough variant asthma with component of UACS 11/20/2019   Elevated troponin 10/02/2019   Chest pain 10/01/2019   Unstable angina ([place]) 04/18/2018   Hypothyroidism 04/18/2018   S/P angioplasty with stent- DES to [facility] and to LIMA to LAD with DES 04/09/18.   04/10/2018   Angina pectoris ([place]) 04/05/2018   Status post coronary artery stent placement    ACS (acute coronary syndrome) ([place]) 05/31/2017   Acute chest pain    NSTEMI (non-ST elevated myocardial infarction) ([place])    History of coronary artery disease    Diabetes ([place]) 10/28/2013   Hypoglycemia associated with diabetes ([place]) 10/28/2013   Thrombocytopenia, unspecified ([place]) 10/28/2013   Hypokalemia 10/27/2013   Syncope 10/26/2013   Anemia 10/26/2013   AKI (acute kidney injury) ([place]) 10/26/2013   Fracture of toe of right foot 07/04/5101   Umbilical hernia    Carotid artery disease ([place])    Occlusion and stenosis of carotid artery without mention of cerebral infarction 04/15/2013   Hx of CABG    Ejection fraction    Atherosclerosis of native artery of extremity with intermittent claudication ([place]) 02/11/2013   Diabetes mellitus with renal manifestation ([place]) 01/21/2013   Bradycardia 01/03/2013   Chronic kidney disease (CKD), stage III (moderate) ([place])    Gout    PROTEINURIA, MILD 01/18/2010   Class 2 obesity 08/26/2009   Asthma 08/26/2009   Hyperlipidemia 03/31/2007   Essential hypertension 03/31/2007   CAD (coronary artery disease) 03/31/2007   PCP:  Practice, [place]:   [facility] [address], [place] - [address] [place] [address] Phone: [phone] Fax: [place] [address], [place] - [place] [address] Phone: [phone] Fax: [phone]  Readmission Risk Interventions Readmission Risk Prevention Plan 06/25/2020  Transportation Screening Complete  Medication Review ([place]) Complete  PCP or Specialist appointment within 3-5 days of discharge Complete  HRI or [place] Complete  SW Recovery Care/Counseling Consult Complete  Palliative Care Screening Not [place] Not Applicable  Some recent data might be hidden

## 2020-06-25 NOTE — Progress Notes (Signed)
PROGRESS NOTE    Donna Howe  JSE:[id number] DOB: [birth year] DOA: 06/18/2020 PCP: [facility]    Brief Narrative:  Patient admitted to the hospital with a working diagnosis of acute kidney injury on chronic kidney disease, complicated by hyperkalemia, uremia and bradycardia.  64 year old female with past medical history for chronic kidney disease stage V, type 2 diabetes mellitus, dyslipidemia, and hypertension. Recent hospitalization at [facility] for non-STEMI. Patient was readmitted today after discharge due to bradycardia. At home patient had generalized weakness, fatigue, confusion and slurred speech. On her initial physical examination her heart rate was 45 bpm, blood pressure 142/32, respiratory rate 18, oxygen saturation 90%. Her lungs were clear to auscultation bilaterally, heart S1-S2, present, bradycardic, soft abdomen, positive bilateral lower extremity edema more right than left. Sodium 132, potassium 6.7, chloride 101, bicarb 20, glucose 306,BUN 106, creatinine 5.18, troponin I 482, BNP 2,129, white count 11.5, hemoglobin 9.2, hematocrit 27.5, platelets 88. SARS COVID-19 negative. EKG 45 bpm, rightward axis, right bundle branch block, junctional rhythm, no significant ST segment changes, negative T wave V1 through V3.  Patient received medical therapy for hyperkalemia including Kayexalate, calcium gluconate, and insulin.  On 10/4 patient underwent placement of tunneled HD catheter in the right internal jugular and posteriorly underwent hemodialysis.   On 10/5 patient underwent coronary angiography showing diffuse and severe coronary artery disease, including native and graft vessels. Positive in-stent restenosis LIMA to LAD. Patient was referred for reevaluation for bypass grafting. She had her first CABG approximately 16 years ago.  Final recommendations from cardiology to continue with aggressive medical management.   Patient will  get permament access placed before discharge.    Assessment & Plan:   Principal Problem:   End stage renal disease ([place]) Active Problems:   Class 2 obesity   Bradycardia   Diabetes mellitus with renal manifestation (HCC)   Atherosclerosis of native artery of extremity with intermittent claudication (HCC)   CAD (coronary artery disease)   Carotid artery disease (HCC)   Anemia   Chronic diastolic HF (heart failure) (HCC)   Hyperkalemia   1. Hyperkalemia due to progression of CKD to end stage.  Patient on HD today, plan for ultrafiltration 2,5 L as tolerated. Urine output over last 24 is 1,400 cc.   Continue torsemide per nephrology recommendations.  Patient will get fistula creation before discharge, follow with vascular surgery recommendations.   2. Severe coronary artery disease with recent NSTEMI/ acute on chronic diastolic heart failure.  Echocardiogram with preserved LV systolic function 55 to 07%.    Continue with agressive medical therapy with aspirin, clopidogrel and atorvastatin.  On torsemide. Holding b blockade or RAAS inhibition due to risk of hypotension.    3. HTN. Blood pressure this am 371 mmHg systolic, plan for ultrafiltration on HD today.   4. Controlled T2DM. fasting glucose this am 197, will continue current regimen due to risk of hypoglycemia in the setting of ESRD.   5. Anemia of chronic renal disease/ iron deficiency.  Iron 146, TIBC 239, transferrin saturation 61.  Plan for PRBC transfusion today during HD. Patient started on EPO.  6. Anxiety. Continue with alprazolam as needed and qhs.   7. Hypothyroid. Continue with levothyroxine.    Status is: Inpatient  Remains inpatient appropriate because:IV treatments appropriate due to intensity of illness or inability to take PO   Dispo: The patient is from: Home              Anticipated d/c is  to: Home              Anticipated d/c date is: > 3 days              Patient currently is not  medically stable to d/c.   DVT prophylaxis: Heparin   Code Status:   full  Family Communication:  No family at the bedside      Nutrition Status: Nutrition Problem: Inadequate oral intake Etiology: nausea, decreased appetite Signs/Symptoms: per patient/family report Interventions: Ensure Enlive (each supplement provides 350kcal and 20 grams of protein), MVI, Education     Consultants:  Cardiology   Nephrology  Procedures:  HD catheter  Cardiac catheterization   Subjective: Patient examined during HD, complains of being weak and deconditioned, edema of lower extremities is improving, no dyspnea, and no chest pain for the last 48  Objective: Vitals:   06/25/20 1453 06/25/20 1510 06/25/20 1532 06/25/20 1600  BP: (!) 143/62 (!) 135/57 (!) 135/54 (!) 122/59  Pulse: 68 65 63 (!) 59  Resp: (!) 24 (!) 22 19 (!) 22  Temp: 98.3 F (36.8 C) 98.2 F (36.8 C) 97.8 F (36.6 C)   TempSrc:   Oral   SpO2: 100% 100% 100% 100%  Weight:      Height:        Intake/Output Summary (Last 24 hours) at 06/25/2020 1624 Last data filed at 06/25/2020 1510 Gross per 24 hour  Intake 675 ml  Output 1300 ml  Net -625 ml   Filed Weights   06/24/20 0500 06/25/20 0635 06/25/20 1420  Weight: 98.6 kg 98.9 kg 98.3 kg    Examination:   General: Not in pain or dyspnea, deconditioned  Neurology: Awake and alert, non focal  E ENT: mild pallor, no icterus, oral mucosa moist Cardiovascular: No JVD. S1-S2 present, rhythmic, no gallops, rubs, or murmurs. ++ bilateral pitting lower extremity edema. Pulmonary: positive breath sounds bilaterally, adequate air movement, no wheezing, rhonchi or rales. Gastrointestinal. Abdomen soft and non tender Skin. No rashes Musculoskeletal: no joint deformities     Data Reviewed: I have personally reviewed following labs and imaging studies  CBC: Recent Labs  Lab 06/21/20 1043 06/22/20 0725 06/23/20 0354 06/24/20 0359 06/25/20 0135  WBC 8.0  7.7 8.6 9.6 9.9  NEUTROABS  --   --   --   --  6.3  HGB 7.8* 7.7* 7.1* 7.5* 7.4*  HCT 23.9* 23.2* 22.6* 23.4* 23.1*  MCV 92.3 92.4 92.6 93.2 93.5  PLT 106* 104* 118* 122* 425*   Basic Metabolic Panel: Recent Labs  Lab 06/20/20 0251 06/20/20 0251 06/21/20 1043 06/22/20 0725 06/23/20 0354 06/24/20 0359 06/25/20 0135  NA 137   < > 138 138 137 140 137  K 4.5   < > 3.4* 3.2* 3.5 3.4* 3.7  CL 100   < > 96* 99 96* 99 98  CO2 25   < > 27 29 29 29 28   GLUCOSE 138*   < > 221* 165* 138* 194* 197*  BUN 106*   < > 103* 56* 58* 34* 42*  CREATININE 5.60*   < > 5.72* 3.93* 4.21* 3.77* 4.74*  CALCIUM 8.3*   < > 8.1* 8.1* 8.5* 8.5* 8.4*  PHOS 5.7*  --   --   --   --   --   --    < > = values in this interval not displayed.   GFR: Estimated Creatinine Clearance: 14.7 mL/min (A) (by C-G formula based on SCr of 4.74  mg/dL (H)). Liver Function Tests: Recent Labs  Lab 06/19/20 0236 06/20/20 0251  AST 129*  --   ALT 211*  --   ALKPHOS 104  --   BILITOT 1.2  --   PROT 5.5*  --   ALBUMIN 2.8* 2.5*   No results for input(s): LIPASE, AMYLASE in the last 168 hours. No results for input(s): AMMONIA in the last 168 hours. Coagulation Profile: No results for input(s): INR, PROTIME in the last 168 hours. Cardiac Enzymes: No results for input(s): CKTOTAL, CKMB, CKMBINDEX, TROPONINI in the last 168 hours. BNP (last 3 results) No results for input(s): PROBNP in the last 8760 hours. HbA1C: No results for input(s): HGBA1C in the last 72 hours. CBG: Recent Labs  Lab 06/24/20 1200 06/24/20 1609 06/24/20 2055 06/25/20 0815 06/25/20 1156  GLUCAP 215* 194* 151* 188* 259*   Lipid Profile: No results for input(s): CHOL, HDL, LDLCALC, TRIG, CHOLHDL, LDLDIRECT in the last 72 hours. Thyroid Function Tests: No results for input(s): TSH, T4TOTAL, FREET4, T3FREE, THYROIDAB in the last 72 hours. Anemia Panel: No results for input(s): VITAMINB12, FOLATE, FERRITIN, TIBC, IRON, RETICCTPCT in the last 72  hours.    Radiology Studies: I have reviewed all of the imaging during this hospital visit personally     Scheduled Meds: . Darbepoetin Alfa      . heparin sodium (porcine)      . ALPRAZolam  0.25 mg Oral BID  . ALPRAZolam  0.5 mg Oral QHS  . aspirin EC  81 mg Oral Daily  . atorvastatin  80 mg Oral QPM  . Chlorhexidine Gluconate Cloth  6 each Topical Q0600  . Chlorhexidine Gluconate Cloth  6 each Topical Q0600  . clopidogrel  75 mg Oral Daily  . darbepoetin (ARANESP) injection - DIALYSIS  100 mcg Intravenous Q Fri-HD  . feeding supplement (ENSURE ENLIVE)  237 mL Oral BID BM  . heparin  5,000 Units Subcutaneous Q8H  . insulin aspart  0-5 Units Subcutaneous QHS  . insulin aspart  0-9 Units Subcutaneous TID WC  . insulin glargine  10 Units Subcutaneous Daily  . levothyroxine  125 mcg Oral Daily  . multivitamin  1 tablet Oral QHS  . pantoprazole  40 mg Oral BID  . potassium chloride  20 mEq Oral BID  . sodium chloride flush  3 mL Intravenous Q12H  . sodium chloride flush  3 mL Intravenous Q12H  . torsemide  100 mg Oral Daily  . Vitamin D (Ergocalciferol)  50,000 Units Oral Q Mon   Continuous Infusions: . sodium chloride    . nitroGLYCERIN Stopped (06/23/20 0207)     LOS: 7 days        Elmon Shader Gerome Apley, MD

## 2020-06-25 NOTE — Consult Note (Addendum)
Hospital Consult    Reason for Consult:  Permanent dialysis access Requesting Physician:  Johnney Ou MRN #:  [id number]  History of Present Illness: This is a 64 y.o. female who was admitted to the hospital with NSTEMI and had LHC on 06/22/2020 with diffuse disease.  She has hx of CABG in 2005.  She did not have any CABG targets and is being treated medically.  She has hx of CKD 5 and has progressed to ESRD.  She had tunneled dialysis catheter placed on 06/21/2020 by IR.  She does have anemia with hgb today of 7.4.  She also has thrombocytopenia with platelet count of 127k.   [facility] is consulted.   She does have hx of right CEA by Dr. Kellie Simmering in August 2014 for asymptomatic carotid artery stenosis.  At her last visit at [facility] with Dr. Carlis Abbott, she was also c/o claudication but no rest pain or tissue loss.  Her carotid duplex revealed 60-79% right ICA stenosis, 1-39% left ICA stenosis and ABI's were 0.73 on the right and 0.68 on the left both biphasic.  She was scheduled to return in 6 months with carotid duplex and repeat ABI as he started her on Pletal.   She is right hand dominant.  She does not have PPM/ICD.  Outpatient HD is being set up for T/T/S at [facility].   The pt is on a statin for cholesterol management.  The pt is on a daily aspirin.   Other AC:  Plavix The pt was on BB for hypertension but discontinued for bradycardia. The pt is diabetic.   Tobacco hx:  former  Past Medical History:  Diagnosis Date  . Anemia   . Asthma   . CAD (coronary artery disease)    Multivessel s/p CABG 2005, numerous PCIs since that time  . Carotid artery disease (HCC)    R CEA  . Chronic kidney disease (CKD), stage III (moderate) (HCC)   . Essential hypertension   . Gout   . Hyperlipidemia   . Hypothyroidism   . Myocardial infarction ([place])   . PAD (peripheral artery disease) ([place])    Dr. Kellie Simmering  . S/P angioplasty with stent- DES to [facility] and to [facility] to LAD with DES 04/09/18.   04/10/2018  . SBO (small bowel  obstruction) ([place]) 2011   Status post lysis of adhesions & hernia repair  . Sinus bradycardia   . Thrombocytopenia, unspecified ([place])   . Type 2 diabetes mellitus ([place])   . Umbilical hernia     Past Surgical History:  Procedure Laterality Date  . CESAREAN SECTION  1984  . CHOLECYSTECTOMY  2010  . CORONARY ARTERY BYPASS GRAFT  2005  . CORONARY BALLOON ANGIOPLASTY N/A 05/31/2017   Procedure: CORONARY BALLOON ANGIOPLASTY;  Surgeon: Jettie Booze, MD;  Location: [place] [facility];  Service: Cardiovascular;  Laterality: N/A;  . CORONARY STENT INTERVENTION N/A 05/31/2017   Procedure: CORONARY STENT INTERVENTION;  Surgeon: Jettie Booze, MD;  Location: [place] [facility];  Service: Cardiovascular;  Laterality: N/A;  . CORONARY STENT INTERVENTION N/A 04/09/2018   Procedure: CORONARY STENT INTERVENTION;  Surgeon: Jettie Booze, MD;  Location: [place] [facility];  Service: Cardiovascular;  Laterality: N/A;  SVG RCA  . ENDARTERECTOMY Right 04/18/2013   Procedure: ENDARTERECTOMY CAROTID;  Surgeon: Mal Misty, MD;  Location: [place];  Service: Vascular;  Laterality: Right;  . HERNIA REPAIR  1989  . Incisional hernia repair x2  03/04/2010   Laparoscopic with 35cm mesh  by Dr Ronnald Collum  . IR FLUORO GUIDE CV LINE RIGHT  06/21/2020  . IR US GUIDE VASC ACCESS RIGHT  06/21/2020  . LEFT HEART CATH AND CORS/GRAFTS ANGIOGRAPHY N/A 05/31/2017   Procedure: LEFT HEART CATH AND CORS/GRAFTS ANGIOGRAPHY;  Surgeon: Jettie Booze, MD;  Location: [place] [facility];  Service: Cardiovascular;  Laterality: N/A;  . LEFT HEART CATH AND CORS/GRAFTS ANGIOGRAPHY N/A 04/08/2018   Procedure: LEFT HEART CATH AND CORS/GRAFTS ANGIOGRAPHY;  Surgeon: Jettie Booze, MD;  Location: [place] [facility];  Service: Cardiovascular;  Laterality: N/A;  . LEFT HEART CATH AND CORS/GRAFTS ANGIOGRAPHY N/A 06/22/2020   Procedure: LEFT HEART CATH AND CORS/GRAFTS ANGIOGRAPHY;  Surgeon: Belva Crome, MD;  Location:  [place] [facility];  Service: Cardiovascular;  Laterality: N/A;  . LEFT HEART CATHETERIZATION WITH CORONARY ANGIOGRAM N/A 12/19/2012   Procedure: LEFT HEART CATHETERIZATION WITH CORONARY ANGIOGRAM;  Surgeon: Josue Hector, MD;  Location: [facility] CATH LAB;  Service: Cardiovascular;  Laterality: N/A;  . LEFT HEART CATHETERIZATION WITH CORONARY/GRAFT ANGIOGRAM N/A 04/19/2013   Procedure: LEFT HEART CATHETERIZATION WITH Beatrix Fetters;  Surgeon: Lorretta Harp, MD;  Location: [facility] CATH LAB;  Service: Cardiovascular;  Laterality: N/A;  . PATCH ANGIOPLASTY Right 04/18/2013   Procedure: PATCH ANGIOPLASTY Right Internal Carotid Artery;  Surgeon: Mal Misty, MD;  Location: [place];  Service: Vascular;  Laterality: Right;  . PERCUTANEOUS CORONARY STENT INTERVENTION (PCI-S) Right 12/19/2012   Procedure: PERCUTANEOUS CORONARY STENT INTERVENTION (PCI-S);  Surgeon: Josue Hector, MD;  Location: [facility] CATH LAB;  Service: Cardiovascular;  Laterality: Right;  . SHOULDER SURGERY      Allergies  Allergen Reactions  . Penicillins Other (See Comments)    REACTION: Unknown, told as a child Has patient had a PCN reaction causing immediate rash, facial/tongue/throat swelling, SOB or lightheadedness with hypotension: Unknown Has patient had a PCN reaction causing severe rash involving mucus membranes or skin necrosis: Unknown Has patient had a PCN reaction that required hospitalization: Unknown Has patient had a PCN reaction occurring within the last 10 years: No If all of the above answers are "NO", then may proceed with Cephalosporin use.     Prior to Admission medications   Medication Sig Start Date End Date Taking? Authorizing Provider  Albuterol Sulfate 108 (90 Base) MCG/ACT AEPB Inhale 2 puffs into the lungs every 4 (four) hours as needed (Shortness of breath).    Yes [provider]  ALPRAZolam Duanne Moron) 0.5 MG tablet Take 0.25-0.5 mg by mouth See admin instructions. Take 0.25 mg tab by mouth every  morning & evening as needed  and .05 mg tab at bedtime   Yes [provider]  aspirin EC 81 MG tablet Take 81 mg by mouth daily.    Yes [provider]  atorvastatin (LIPITOR) 80 MG tablet Take 1 tablet (80 mg total) by mouth every evening. 12/19/19  Yes Johnson, Clanford L, MD  benzonatate (TESSALON) 100 MG capsule Take 100 mg by mouth 3 (three) times daily as needed for cough.   Yes [provider]  clopidogrel (PLAVIX) 75 MG tablet Take 1 tablet (75 mg total) by mouth daily. 08/27/18  Yes Satira Sark, MD  ferrous sulfate 325 (65 FE) MG tablet Take 325 mg by mouth daily with breakfast.   Yes [provider]  gabapentin (NEURONTIN) 400 MG capsule Take 1 capsule (400 mg total) by mouth 2 (two) times daily. 06/17/20  Yes Barton Dubois, MD  ipratropium (ATROVENT HFA) 17  MCG/ACT inhaler Inhale 2 puffs into the lungs See admin instructions. Inhale 2 puffs every 4 to 6 hrs as needed.   Yes [provider]  levothyroxine (SYNTHROID, LEVOTHROID) 125 MCG tablet Take 125 mcg by mouth daily.    Yes [provider]  NOVOLIN 70/30 RELION (70-30) 100 UNIT/ML injection Inject 24 Units into the skin 2 (two) times daily with a meal. Am & PM 08/28/15  Yes [provider]  ondansetron (ZOFRAN ODT) 8 MG disintegrating tablet Take 1 tablet (8 mg total) by mouth every 8 (eight) hours as needed for nausea or vomiting. 08/14/15  Yes Idol, Almyra Free, PA-C  pantoprazole (PROTONIX) 40 MG tablet Take 1 tablet (40 mg total) by mouth 2 (two) times daily. 06/17/20  Yes Barton Dubois, MD  ranolazine (RANEXA) 500 MG 12 hr tablet Take 1 tablet (500 mg total) by mouth 2 (two) times daily. 06/17/20  Yes Barton Dubois, MD  sodium bicarbonate 650 MG tablet Take 650 mg by mouth 2 (two) times daily.   Yes [provider]  Vitamin D, Ergocalciferol, (DRISDOL) 1.25 MG (50000 UNIT) CAPS capsule Take 50,000 Units by mouth once a week. 12/31/19  Yes [provider]   metoprolol tartrate (LOPRESSOR) 25 MG tablet Take 0.5 tablets (12.5 mg total) by mouth 2 (two) times daily. 06/17/20   Barton Dubois, MD  nitroGLYCERIN (NITROSTAT) 0.4 MG SL tablet Place 0.4 mg under the tongue every 5 (five) minutes as needed for chest pain. Not to exceed 3 in 15 minute time frame    [provider]    Social History   Socioeconomic History  . Marital status: Divorced    Spouse name: Not on file  . Number of children: Not on file  . Years of education: Not on file  . Highest education level: Not on file  Occupational History  . Occupation: Disabled  Tobacco Use  . Smoking status: Former Smoker    Packs/day: 1.00    Years: 20.00    Pack years: 20.00    Types: Cigarettes    Quit date: 12/10/2012    Years since quitting: 7.5  . Smokeless tobacco: Never Used  Vaping Use  . Vaping Use: Never used  Substance and Sexual Activity  . Alcohol use: No    Alcohol/week: 0.0 standard drinks  . Drug use: No  . Sexual activity: Not Currently  Other Topics Concern  . Not on file  Social History Narrative   Lives with mother.   Social Determinants of Health   Financial Resource Strain:   . Difficulty of Paying Living Expenses: Not on file  Food Insecurity:   . Worried About Charity fundraiser in the Last Year: Not on file  . Ran Out of Food in the Last Year: Not on file  Transportation Needs:   . Lack of Transportation (Medical): Not on file  . Lack of Transportation (Non-Medical): Not on file  Physical Activity:   . Days of Exercise per Week: Not on file  . Minutes of Exercise per Session: Not on file  Stress:   . Feeling of Stress : Not on file  Social Connections:   . Frequency of Communication with Friends and Family: Not on file  . Frequency of Social Gatherings with Friends and Family: Not on file  . Attends Religious Services: Not on file  . Active Member of Clubs or Organizations: Not on file  . Attends Archivist Meetings: Not on  file  . Marital Status:  Not on file  Intimate Partner Violence:   . Fear of Current or Ex-Partner: Not on file  . Emotionally Abused: Not on file  . Physically Abused: Not on file  . Sexually Abused: Not on file     Family History  Problem Relation Age of Onset  . Diabetes Mother   . Heart disease Mother        before age 52  . Hyperlipidemia Mother   . Hypertension Mother   . Thyroid disease Father   . Hypertension Father   . AAA (abdominal aortic aneurysm) Father   . Heart disease Brother        before age 88  . Hypertension Brother   . Hyperlipidemia Son   . Hypertension Son     ROS: [x]  Positive   [ ]  Negative   [ ]  All sytems reviewed and are negative  Cardiac: [x]  chest pain/pressure [x]  hx CAD with CABG [x]  DOE  Vascular: [x]  pain in legs while walking []  pain in legs at rest []  pain in legs at night []  non-healing ulcers []  hx of DVT []  swelling in legs  Pulmonary: [x]  asthma/wheezing  Neurologic: []  weakness or numbness in []  arms []  legs []  hx of CVA []  mini stroke [x] difficulty speaking or slurred speech []  temporary loss of vision in one eye  Hematologic: [x]  anemia   Endocrine:   [x]  diabetes [x]  thyroid disease  GI []  vomiting blood []  blood in stool  GU: [x]  CKD/renal failure [x]  HD--[]  M/W/F or []  T/T/S  Psychiatric: []  anxiety []  depression  Musculoskeletal: [x]  hx gout   Integumentary: []  rashes []  ulcers  Constitutional: []  fever  []  chills  Physical Examination  Vitals:   06/25/20 0635 06/25/20 0818  BP: (!) 124/58 100/60  Pulse: 70 66  Resp: 18 17  Temp: 98 F (36.7 C) 98.8 F (37.1 C)  SpO2: 100% 91%   Body mass index is 35.21 kg/m.  General:  WDWN in NAD Gait: Not observed HENT: WNL, normocephalic Pulmonary: normal non-labored breathing, without Rales, rhonchi,  wheezing Cardiac: regularwith right carotid bruit Abdomen:  soft, NT/ND, no masses Skin: without rashes Vascular Exam/Pulses:  Right  Left  Radial 2+ (normal) 2+ (normal)  Ulnar 2+ (normal) 2+ (normal)  Brachial 2+ (normal) 2+ (normal)  DP 2+ (normal) Unable to palpate  PT Unable to palpate Unable to palpate   Extremities: without ischemic changes, without Gangrene , without cellulitis; without open wounds;  IV in left arm with some ecchymosis Musculoskeletal: no muscle wasting or atrophy  Neurologic: A&O X 3;  No focal weakness or paresthesias are detected; speech is fluent/normal Psychiatric:  The pt has Normal affect.   CBC    Component Value Date/Time   WBC 9.9 06/25/2020 0135   RBC 2.47 (L) 06/25/2020 0135   HGB 7.4 (L) 06/25/2020 0135   HCT 23.1 (L) 06/25/2020 0135   PLT 127 (L) 06/25/2020 0135   MCV 93.5 06/25/2020 0135   MCH 30.0 06/25/2020 0135   MCHC 32.0 06/25/2020 0135   RDW 15.4 06/25/2020 0135   LYMPHSABS 1.9 06/25/2020 0135   MONOABS 1.3 (H) 06/25/2020 0135   EOSABS 0.2 06/25/2020 0135   BASOSABS 0.0 06/25/2020 0135    BMET    Component Value Date/Time   NA 137 06/25/2020 0135   K 3.7 06/25/2020 0135   CL 98 06/25/2020 0135   CO2 28 06/25/2020 0135   GLUCOSE 197 (H) 06/25/2020 0135   BUN 42 (H) 06/25/2020 0135  CREATININE 4.74 (H) 06/25/2020 0135   CALCIUM 8.4 (L) 06/25/2020 0135   CALCIUM 8.7 10/27/2013 1103   GFRNONAA 9 (L) 06/25/2020 0135   GFRAA 9 (L) 06/21/2020 1043    COAGS: Lab Results  Component Value Date   INR 1.0 11/26/2019   INR 1.0 10/02/2019   INR 0.88 04/04/2018     Non-Invasive Vascular Imaging:   BUE vein mapping on 06/21/2020 +-----------------+-------------+----------+---------+  Right Cephalic  Diameter (cm)Depth (cm)Findings   +-----------------+-------------+----------+---------+  Shoulder       0.43     2.00         +-----------------+-------------+----------+---------+  Prox upper arm    0.29     2.10         +-----------------+-------------+----------+---------+  Mid upper arm    0.25     1.30          +-----------------+-------------+----------+---------+  Dist upper arm    0.33     0.94         +-----------------+-------------+----------+---------+  Antecubital fossa  0.35     0.55  branching  +-----------------+-------------+----------+---------+  Prox forearm     0.22     0.89         +-----------------+-------------+----------+---------+  Mid forearm     0.22     0.64         +-----------------+-------------+----------+---------+  Dist forearm     0.15     0.47         +-----------------+-------------+----------+---------+   +-----------------+-------------+----------+--------------+  Right Basilic  Diameter (cm)Depth (cm)  Findings    +-----------------+-------------+----------+--------------+  Shoulder       0.47     1.34           +-----------------+-------------+----------+--------------+  Mid upper arm    0.29     1.50           +-----------------+-------------+----------+--------------+  Dist upper arm    0.18     1.21           +-----------------+-------------+----------+--------------+  Antecubital fossa  0.16     1.30   branching    +-----------------+-------------+----------+--------------+  Prox forearm     0.11     0.51           +-----------------+-------------+----------+--------------+  Mid forearm               not visualized  +-----------------+-------------+----------+--------------+  Distal forearm              not visualized  +-----------------+-------------+----------+--------------+   +-----------------+-------------+----------+----------------+  Left Cephalic  Diameter (cm)Depth (cm)  Findings    +-----------------+-------------+----------+----------------+  Shoulder       0.40     1.70             +-----------------+-------------+----------+----------------+  Prox upper arm    0.55     1.44            +-----------------+-------------+----------+----------------+  Mid upper arm    0.34     1.10    branching    +-----------------+-------------+----------+----------------+  Dist upper arm    0.37     0.68            +-----------------+-------------+----------+----------------+  Antecubital fossa  0.47     0.32  Partial thrombus  +-----------------+-------------+----------+----------------+  Prox forearm     0.38     0.74            +-----------------+-------------+----------+----------------+  Mid forearm     0.46     0.44            +-----------------+-------------+----------+----------------+  Dist forearm     0.45     0.39  Partial thrombus  +-----------------+-------------+----------+----------------+   +-----------------+-------------+----------+--------------+  Left Basilic   Diameter (cm)Depth (cm)  Findings    +-----------------+-------------+----------+--------------+  Shoulder       0.37     1.80           +-----------------+-------------+----------+--------------+  Mid upper arm    0.42     1.40           +-----------------+-------------+----------+--------------+  Dist upper arm    0.43     1.50           +-----------------+-------------+----------+--------------+  Antecubital fossa  0.39     0.93           +-----------------+-------------+----------+--------------+  Prox forearm     0.10     0.31           +-----------------+-------------+----------+--------------+  Mid forearm               not visualized  +-----------------+-------------+----------+--------------+  Distal forearm              not  visualized  +-----------------+-------------+----------+--------------+    ASSESSMENT/PLAN: This is a 64 y.o. female with CKD 5 that has progressed to ESRD now in need of HD access  -pt is right hand dominant.  Her left cephalic vein is adequate size for radial cephalic but there is partial thrombus in the [facility] fossa.  May need BC AVF.  The vein is deep and will most likely need superficialization in the future, which I discussed with pt.  Also discussed steal sx.  -she has a restricted band on the right arm.  Pt is left handed and her vein is suitable for fistula in the left arm.  Will have them remove the IV from the left arm, restrict the left arm.  -if pt is to be discharged, we can do her access as an outpatient. -she is also due for carotid duplex and ABI's.  If pt is still in the hospital next week, we can get those while she is here otherwise, we will set up follow up appt for this.  She does have claudication with about 1/2 city block and both legs are equal.  As far as her carotids, she says she did have some slurred speech with her abnormal labs and she has not experienced this again.  -pt had right TDC placed in IR on 06/21/2020   Leontine Locket, PA-C [facility] [phone]   I have independently interviewed and examined patient and agree with PA assessment and plan above.  We will plan for left arm AV fistula versus graft early next week prior to patient discharge.  I will touch base with her this weekend regarding timing once she is scheduled.  Jens Siems C. Donzetta Matters, MD [facility] [place] Office: [phone] Pager: [phone]

## 2020-06-25 NOTE — Progress Notes (Signed)
[facility] Progress Note  Name: Donna Howe MRN: [id number] DOB: 06-16-1956  Subjective: No new issues, feeling ok this AM.  Poor appetite continues.    Intake/Output Summary (Last 24 hours) at 06/25/2020 0807 Last data filed at 06/25/2020 0600 Gross per 24 hour  Intake 940 ml  Output 1400 ml  Net -460 ml    Vitals:  Vitals:   06/24/20 1611 06/24/20 1947 06/25/20 0012 06/25/20 0635  BP: (!) 103/51 (!) 103/51 115/60 (!) 124/58  Pulse: 72   70  Resp: 15 18 18 18   Temp: 99.3 F (37.4 C) 98 F (36.7 C) 98.4 F (36.9 C) 98 F (36.7 C)  TempSrc: Oral Oral Oral   SpO2: 93%   100%  Weight:    98.9 kg  Height:         Physical Exam:  General: adult female in bed in NAD  HEENT: NCAT Eyes: EOMI sclera anicteric Neck: supple trachea midline Heart: S1S2 no rub Lungs: clear with normal WOB Abdomen: softly dist/obese habitus/nontender Extremities: trace LE Edema Skin: no rash on extremities exposed Psych normal mood and affect Neuro: conversant  Medications reviewed   Labs:  BMP Latest Ref Rng & Units 06/25/2020 06/24/2020 06/23/2020  Glucose 70 - 99 mg/dL 197(H) 194(H) 138(H)  BUN 8 - 23 mg/dL 42(H) 34(H) 58(H)  Creatinine 0.44 - 1.00 mg/dL 4.74(H) 3.77(H) 4.21(H)  Sodium 135 - 145 mmol/L 137 140 137  Potassium 3.5 - 5.1 mmol/L 3.7 3.4(L) 3.5  Chloride 98 - 111 mmol/L 98 99 96(L)  CO2 22 - 32 mmol/L 28 29 29   Calcium 8.9 - 10.3 mg/dL 8.4(L) 8.5(L) 8.5(L)     Assessment/Plan:   # NSTEMI  - s/p LHC 10/5 with diffuse disease --> no CABG targets, cardiology to eval if any PCI options; per notes unlikely but being discussed.   - noted cardiology has stopped beta blocker due to bradycardia  # CKD stage V with progression to end-stage renal disease -s/p TDC 10/4, 1st HD 10/4, 2nd 10/6, 3rd 10/3.  -  ein mapping completed.  As it appears CAD is most likely to be medically managed will consult [facility] today for perm access.  - Set up for TTS HD at [facility] on  d/c, see renal navigator note for details.  # Hypokalemia:  Supplemented to normal - on KCl po and 4K dialysate  # Acute on chronic diastolic CHF  - cont po diuretics and on discharge.  I think she will tolerate her HD better if her need for UF is limited.  Her UOP is quite good.  - small vol UF with HD as tolerated  # HTN  - BPs have been reasonable  # Anemia of CKD  - started ESA - Transfuse as needed --> cardiology recommending transfusion will defer to primary team but with ongoing intermittent CP this seems very reasonable. - appears iron is ok (ferritin low but iron level and tsat ok) --> D/c po iron as she will be followed closely for need for IV iron.  Justin Mend, MD 06/25/2020 8:07 AM

## 2020-06-26 DIAGNOSIS — R001 Bradycardia, unspecified: Secondary | ICD-10-CM | POA: Diagnosis not present

## 2020-06-26 DIAGNOSIS — I70219 Atherosclerosis of native arteries of extremities with intermittent claudication, unspecified extremity: Secondary | ICD-10-CM | POA: Diagnosis not present

## 2020-06-26 DIAGNOSIS — N186 End stage renal disease: Secondary | ICD-10-CM | POA: Diagnosis not present

## 2020-06-26 DIAGNOSIS — I25110 Atherosclerotic heart disease of native coronary artery with unstable angina pectoris: Secondary | ICD-10-CM | POA: Diagnosis not present

## 2020-06-26 LAB — TYPE AND SCREEN
ABO/RH(D): A POS
Antibody Screen: NEGATIVE
Unit division: 0

## 2020-06-26 LAB — CBC WITH DIFFERENTIAL/PLATELET
Abs Immature Granulocytes: 0.1 10*3/uL — ABNORMAL HIGH (ref 0.00–0.07)
Basophils Absolute: 0 10*3/uL (ref 0.0–0.1)
Basophils Relative: 0 %
Eosinophils Absolute: 0.1 10*3/uL (ref 0.0–0.5)
Eosinophils Relative: 1 %
HCT: 27.9 % — ABNORMAL LOW (ref 36.0–46.0)
Hemoglobin: 8.9 g/dL — ABNORMAL LOW (ref 12.0–15.0)
Immature Granulocytes: 1 %
Lymphocytes Relative: 18 %
Lymphs Abs: 1.8 10*3/uL (ref 0.7–4.0)
MCH: 29.5 pg (ref 26.0–34.0)
MCHC: 31.9 g/dL (ref 30.0–36.0)
MCV: 92.4 fL (ref 80.0–100.0)
Monocytes Absolute: 1.6 10*3/uL — ABNORMAL HIGH (ref 0.1–1.0)
Monocytes Relative: 16 %
Neutro Abs: 6.1 10*3/uL (ref 1.7–7.7)
Neutrophils Relative %: 64 %
Platelets: 149 10*3/uL — ABNORMAL LOW (ref 150–400)
RBC: 3.02 MIL/uL — ABNORMAL LOW (ref 3.87–5.11)
RDW: 16.4 % — ABNORMAL HIGH (ref 11.5–15.5)
WBC: 9.7 10*3/uL (ref 4.0–10.5)
nRBC: 0 % (ref 0.0–0.2)

## 2020-06-26 LAB — BPAM RBC
Blood Product Expiration Date: 202111012359
ISSUE DATE / TIME: 202110081448
Unit Type and Rh: 6200

## 2020-06-26 LAB — BASIC METABOLIC PANEL
Anion gap: 11 (ref 5–15)
BUN: 19 mg/dL (ref 8–23)
CO2: 27 mmol/L (ref 22–32)
Calcium: 8.7 mg/dL — ABNORMAL LOW (ref 8.9–10.3)
Chloride: 102 mmol/L (ref 98–111)
Creatinine, Ser: 3.41 mg/dL — ABNORMAL HIGH (ref 0.44–1.00)
GFR, Estimated: 14 mL/min — ABNORMAL LOW (ref >60)
Glucose, Bld: 197 mg/dL — ABNORMAL HIGH (ref 70–99)
Potassium: 4.5 mmol/L (ref 3.5–5.1)
Sodium: 140 mmol/L (ref 135–145)

## 2020-06-26 LAB — GLUCOSE, CAPILLARY
Glucose-Capillary: 212 mg/dL — ABNORMAL HIGH (ref 70–99)
Glucose-Capillary: 298 mg/dL — ABNORMAL HIGH (ref 70–99)
Glucose-Capillary: 176 mg/dL — ABNORMAL HIGH (ref 70–99)
Glucose-Capillary: 241 mg/dL — ABNORMAL HIGH (ref 70–99)

## 2020-06-26 NOTE — Progress Notes (Signed)
PROGRESS NOTE    Donna Howe  VEL:[id number] DOB: 1956/06/10 DOA: 06/18/2020 PCP: [facility]    Brief Narrative:  Patient admitted to the hospital with a working diagnosis of acute kidney injury on chronic kidney disease, complicated by hyperkalemia, uremia and bradycardia.  64 year old female with past medical history for chronic kidney disease stage V, type 2 diabetes mellitus, dyslipidemia, and hypertension. Recent hospitalization at [facility] for non-STEMI. Patient was readmitted today after discharge due to bradycardia. At home patient had generalized weakness, fatigue, confusion and slurred speech. On her initial physical examination her heart rate was 45 bpm, blood pressure 142/32, respiratory rate 18, oxygen saturation 90%. Her lungs were clear to auscultation bilaterally, heart S1-S2, present, bradycardic, soft abdomen, positive bilateral lower extremity edema more right than left. Sodium 132, potassium 6.7, chloride 101, bicarb 20, glucose 306,BUN 106, creatinine 5.18, troponin I 482, BNP 2,129, white count 11.5, hemoglobin 9.2, hematocrit 27.5, platelets 88. SARS COVID-19 negative. EKG 45 bpm, rightward axis, right bundle branch block, junctional rhythm, no significant ST segment changes, negative T wave V1 through V3.  Patient received medical therapy for hyperkalemia including Kayexalate, calcium gluconate, and insulin.  On 10/4 patient underwent placement of tunneled HD catheter in the right internal jugular and posteriorly underwent hemodialysis.   On 10/5 patient underwent coronary angiography showing diffuse and severe coronary artery disease, including native and graft vessels. Positive in-stent restenosis LIMA to LAD. Patient was referred for reevaluation for bypass grafting. She had her first CABG approximately 16 years ago.  Final recommendations from cardiology to continue with aggressive medical management.   Patient will  get permament access placed before discharge.     Assessment & Plan:   Principal Problem:   End stage renal disease ([place]) Active Problems:   Class 2 obesity   Bradycardia   Diabetes mellitus with renal manifestation (HCC)   Atherosclerosis of native artery of extremity with intermittent claudication (HCC)   CAD (coronary artery disease)   Carotid artery disease (HCC)   Anemia   Chronic diastolic HF (heart failure) (HCC)   Hyperkalemia   1. Hyperkalemia due to progression of CKD to end stage. Patient had 2,500 ml ultrafiltration yesterday, urine output is 300 cc, blood pressure this am 80 to 90 systolic. K this am is 4,5 with bicarbonate at 27.   Improved volume status with decreased dyspnea and lower extremity edema, will discontinue on torsemide for now along with K supplementation. Hold on morphine if recurrent sever chest pain, will use fentanyl due to reduced GFR.   Plan for fistula creation before discharge, early next week.  Follow renal function in am.   2. Severe coronary artery disease with recent NSTEMI/ acute on chronic diastolic heart failure. Echocardiogram with preserved LV systolic function 55 to 25%.   No further chest pain, will continue with medical therapy with aspirin, clopidogrel and high dose high potency atorvastatin (80 mg).  On hold b blockade or RAAS inhibition due to risk of hypotension.    3. HTN.patient has been hypotensive today.   4. Controlled T2DM.stable fasting glucose to 197. Continue basal insulin and sliding scale.   5. Anemia of chronic renal disease/ iron deficiency.  Iron 146, TIBC 239, transferrin saturation 61. Sp one unit prbc transfusion.  Hgb today is up to 8,9 and hct at 27.9. Continue with EPO.   6. Anxiety.On alprazolam as needed and qhs.   7. Hypothyroid.Onlevothyroxine.   Patient continue to be at high risk for worsening hypotension  and angina.   Status is: Inpatient  Remains inpatient appropriate  because:Inpatient level of care appropriate due to severity of illness   Dispo: The patient is from: Home              Anticipated d/c is to: Home              Anticipated d/c date is: > 3 days              Patient currently is not medically stable to d/c.Pending fistula creation before discharge.     DVT prophylaxis:  heparin   Code Status:   full  Family Communication:  No family at the bedside      Nutrition Status: Nutrition Problem: Inadequate oral intake Etiology: nausea, decreased appetite Signs/Symptoms: per patient/family report Interventions: Ensure Enlive (each supplement provides 350kcal and 20 grams of protein), MVI, Education      Consultants:   Nephrology   Cardiology   Vascular surgery   Procedures:   Tunneled catheter for HD       Subjective: Patient feeling very weak and deconditioned, no nausea or vomiting, poor oral intake. Having cramps lower extremities.   Objective: Vitals:   06/25/20 1946 06/25/20 2312 06/26/20 0556 06/26/20 0724  BP: (!) 125/53 (!) 128/54 (!) 81/46 (!) 97/55  Pulse:   65 62  Resp: 18 16 18    Temp: 98 F (36.7 C) 98.7 F (37.1 C) 98.7 F (37.1 C) 98.2 F (36.8 C)  TempSrc: Oral Oral Oral Oral  SpO2:   98% 96%  Weight:   96.9 kg   Height:        Intake/Output Summary (Last 24 hours) at 06/26/2020 1148 Last data filed at 06/25/2020 1730 Gross per 24 hour  Intake 315 ml  Output 2800 ml  Net -2485 ml   Filed Weights   06/25/20 1420 06/25/20 1730 06/26/20 0556  Weight: 98.3 kg 95 kg 96.9 kg    Examination:   General: Not in pain or dyspnea, deconditioned  Neurology: Awake and alert, non focal  E ENT: mild pallor, no icterus, oral mucosa moist Cardiovascular: No JVD. S1-S2 present, rhythmic, no gallops, rubs, or murmurs. + bilateral lower extremity edema. Pulmonary: positive breath sounds bilaterally, with no wheezing, rhonchi or rales. Gastrointestinal. Abdomen soft and non tender Skin. No  rashes Musculoskeletal: no joint deformities     Data Reviewed: I have personally reviewed following labs and imaging studies  CBC: Recent Labs  Lab 06/22/20 0725 06/23/20 0354 06/24/20 0359 06/25/20 0135 06/26/20 0228  WBC 7.7 8.6 9.6 9.9 9.7  NEUTROABS  --   --   --  6.3 6.1  HGB 7.7* 7.1* 7.5* 7.4* 8.9*  HCT 23.2* 22.6* 23.4* 23.1* 27.9*  MCV 92.4 92.6 93.2 93.5 92.4  PLT 104* 118* 122* 127* 505*   Basic Metabolic Panel: Recent Labs  Lab 06/20/20 0251 06/21/20 1043 06/22/20 0725 06/23/20 0354 06/24/20 0359 06/25/20 0135 06/26/20 0228  NA 137   < > 138 137 140 137 140  K 4.5   < > 3.2* 3.5 3.4* 3.7 4.5  CL 100   < > 99 96* 99 98 102  CO2 25   < > 29 29 29 28 27   GLUCOSE 138*   < > 165* 138* 194* 197* 197*  BUN 106*   < > 56* 58* 34* 42* 19  CREATININE 5.60*   < > 3.93* 4.21* 3.77* 4.74* 3.41*  CALCIUM 8.3*   < > 8.1* 8.5* 8.5* 8.4*  8.7*  PHOS 5.7*  --   --   --   --   --   --    < > = values in this interval not displayed.   GFR: Estimated Creatinine Clearance: 20.3 mL/min (A) (by C-G formula based on SCr of 3.41 mg/dL (H)). Liver Function Tests: Recent Labs  Lab 06/20/20 0251  ALBUMIN 2.5*   No results for input(s): LIPASE, AMYLASE in the last 168 hours. No results for input(s): AMMONIA in the last 168 hours. Coagulation Profile: No results for input(s): INR, PROTIME in the last 168 hours. Cardiac Enzymes: No results for input(s): CKTOTAL, CKMB, CKMBINDEX, TROPONINI in the last 168 hours. BNP (last 3 results) No results for input(s): PROBNP in the last 8760 hours. HbA1C: No results for input(s): HGBA1C in the last 72 hours. CBG: Recent Labs  Lab 06/25/20 0815 06/25/20 1156 06/25/20 1759 06/25/20 2049 06/26/20 0724  GLUCAP 188* 259* 177* 168* 212*   Lipid Profile: No results for input(s): CHOL, HDL, LDLCALC, TRIG, CHOLHDL, LDLDIRECT in the last 72 hours. Thyroid Function Tests: No results for input(s): TSH, T4TOTAL, FREET4, T3FREE,  THYROIDAB in the last 72 hours. Anemia Panel: No results for input(s): VITAMINB12, FOLATE, FERRITIN, TIBC, IRON, RETICCTPCT in the last 72 hours.    Radiology Studies: I have reviewed all of the imaging during this hospital visit personally     Scheduled Meds: . ALPRAZolam  0.25 mg Oral BID  . ALPRAZolam  0.5 mg Oral QHS  . aspirin EC  81 mg Oral Daily  . atorvastatin  80 mg Oral QPM  . Chlorhexidine Gluconate Cloth  6 each Topical Q0600  . Chlorhexidine Gluconate Cloth  6 each Topical Q0600  . clopidogrel  75 mg Oral Daily  . darbepoetin (ARANESP) injection - DIALYSIS  100 mcg Intravenous Q Fri-HD  . feeding supplement (ENSURE ENLIVE)  237 mL Oral BID BM  . heparin  5,000 Units Subcutaneous Q8H  . insulin aspart  0-5 Units Subcutaneous QHS  . insulin aspart  0-9 Units Subcutaneous TID WC  . insulin glargine  10 Units Subcutaneous Daily  . levothyroxine  125 mcg Oral Daily  . multivitamin  1 tablet Oral QHS  . pantoprazole  40 mg Oral BID  . potassium chloride  20 mEq Oral BID  . sodium chloride flush  3 mL Intravenous Q12H  . sodium chloride flush  3 mL Intravenous Q12H  . torsemide  100 mg Oral Daily  . Vitamin D (Ergocalciferol)  50,000 Units Oral Q Mon   Continuous Infusions: . sodium chloride    . nitroGLYCERIN Stopped (06/23/20 0207)     LOS: 8 days        Meosha Castanon Gerome Apley, MD

## 2020-06-26 NOTE — Progress Notes (Signed)
  SBP 80-90's, patient having frequent PVCs, heart rate went down to 30's for few seconds and back up to 70-80's, sitting on the bed watching TV at this time and talking on the phone, no acute distress  Dr. Cathlean Sauer notified.

## 2020-06-26 NOTE — Progress Notes (Signed)
[facility] Progress Note  Name: Donna Howe MRN: [id number] DOB: 1956/04/23  Subjective: No new issues, feeling ok this AM.  Poor appetite continues.  VVS planning AV access early in week.  Cardiology to medically manage CAD/angina.   She had no CP after HD yesterday even with UF 2.5L   Intake/Output Summary (Last 24 hours) at 06/26/2020 0759 Last data filed at 06/25/2020 1730 Gross per 24 hour  Intake 315 ml  Output 2800 ml  Net -2485 ml    Vitals:  Vitals:   06/25/20 1946 06/25/20 2312 06/26/20 0556 06/26/20 0724  BP: (!) 125/53 (!) 128/54 (!) 81/46 (!) 97/55  Pulse:   65 62  Resp: 18 16 18    Temp: 98 F (36.7 C) 98.7 F (37.1 C) 98.7 F (37.1 C) 98.2 F (36.8 C)  TempSrc: Oral Oral Oral Oral  SpO2:   98% 96%  Weight:   96.9 kg   Height:         Physical Exam:  General: adult female in bed in NAD  HEENT: NCAT Eyes: EOMI sclera anicteric Neck: supple trachea midline Heart: S1S2 no rub Lungs: clear with normal WOB Abdomen: softly dist/obese habitus/nontender Extremities: trace LE Edema Skin: no rash on extremities exposed Psych normal mood and affect Neuro: conversant  Medications reviewed   Labs:  BMP Latest Ref Rng & Units 06/26/2020 06/25/2020 06/24/2020  Glucose 70 - 99 mg/dL 197(H) 197(H) 194(H)  BUN 8 - 23 mg/dL 19 42(H) 34(H)  Creatinine 0.44 - 1.00 mg/dL 3.41(H) 4.74(H) 3.77(H)  Sodium 135 - 145 mmol/L 140 137 140  Potassium 3.5 - 5.1 mmol/L 4.5 3.7 3.4(L)  Chloride 98 - 111 mmol/L 102 98 99  CO2 22 - 32 mmol/L 27 28 29   Calcium 8.9 - 10.3 mg/dL 8.7(L) 8.4(L) 8.5(L)     Assessment/Plan:   # NSTEMI / CAD/ angina - s/p LHC 10/5 with diffuse disease --> no CABG targets, cardiology discussed and no PCI options - noted cardiology has stopped beta blocker due to bradycardia -Medically managing CAD - add imdur if BP will tolerate - won't currently  # CKD stage V with progression to end-stage renal disease -s/p TDC 10/4, 1st HD  10/4 and ongoing since then, last 10/8.  Will switch to TTS at some pt prior to discharge. -  VVS planning AV access early week prior to discharge. - Set up for TTS HD at [facility] on d/c, see renal navigator note for details.  # Hypokalemia:  Supplemented to normal - on KCl po and 4K dialysate  # Acute on chronic diastolic CHF  - cont po diuretics and on discharge.  I think she will tolerate her HD better if her need for UF is limited.  Her UOP is quite good.  - UF with HD as tolerated  # HTN  - BPs have been reasonable bordering on low  # Anemia of CKD  - started ESA - Transfuse as needed  - appears iron is ok (ferritin low but iron level and tsat ok) --> D/c po iron as she will be followed closely for need for IV iron.  Justin Mend, MD 06/26/2020 7:59 AM

## 2020-06-26 NOTE — Evaluation (Signed)
Physical Therapy Evaluation Patient Details Name: Donna Howe MRN: [id number] DOB: 1956-09-10 Today's Date: 06/26/2020   History of Present Illness  The pt is a 64 yo female presenting with weakness, fatigue, and AMS one day following d/c from recent admission for NSTEMI (d/c on 9/30). Upon this admission, pt found to have AKI complicated by hyperkalemia, uremia, and bradycardia. PMH includes: DM II, HLD, HTN, CKD V, CAD with NSTEMI and DES, R carotid stenosis, and PAD.     Clinical Impression  Pt in bed upon arrival of PT, agreeable to evaluation at this time. Prior to admission the pt was mobilizing independently without need for AD or assist for ADLs. The pt now presents with limitations in functional mobility, strength, stability, and activity tolerance due to above dx, and will continue to benefit from skilled PT to address these deficits. The pt was able to demo good independence with bed mobility, but reports in creased dizziness and feelings of "being a bobblehead" with transition to sitting and standing. The pt required minA to power up and steady with use of a RW. The pt is frustrated by her recent decrease in mobility, and would like to progress to d/c home with her son where she would have to navigate a flight of stairs to reach her bedroom/bathroom. The pt will continue to benefit from skilled PT to progress functional strength and activity tolerance to facilitate pt goals of d/c home with family.      Follow Up Recommendations Home health PT;Supervision/Assistance - 24 hour    Equipment Recommendations   (tub bench)    Recommendations for Other Services       Precautions / Restrictions Precautions Precautions: None Restrictions Weight Bearing Restrictions: No      Mobility  Bed Mobility Overal bed mobility: Modified Independent             General bed mobility comments: slight use of bed rails, could likely do without  Transfers Overall transfer level:  Needs assistance Equipment used: Rolling walker (2 wheeled) Transfers: Sit to/from Stand Sit to Stand: Min guard         General transfer comment: minG for safety, pt reports significant dizziness in standing "feels like a bobblehead"  VSS minG to minA to steady with RW  Ambulation/Gait Ambulation/Gait assistance: Min assist Gait Distance (Feet): 3 Feet Assistive device: Rolling walker (2 wheeled) Gait Pattern/deviations: Step-to pattern;Trunk flexed Gait velocity: decreased   General Gait Details: limited by feeling of dizziness and fatigue, no change in vitals, unable to get reliable BP.       Balance Overall balance assessment: Mild deficits observed, not formally tested                                           Pertinent Vitals/Pain Pain Assessment: No/denies pain    Home Living Family/patient expects to be discharged to:: Private residence (pt states she can live with son, information is for his hous) Living Arrangements: Alone Available Help at Discharge: Family (pt going to live with son) Type of Home: Apartment Home Access: Stairs to enter Entrance Stairs-Rails: None Entrance Stairs-Number of Steps: 2-3 Home Layout: Two level Home Equipment: Environmental consultant - 2 wheels;Walker - 4 wheels;Cane - quad;Cane - single point;Bedside commode      Prior Function Level of Independence: Independent         Comments: household and short distanced community  ambulator, drives     Hand Dominance   Dominant Hand: Right    Extremity/Trunk Assessment   Upper Extremity Assessment Upper Extremity Assessment: Overall WFL for tasks assessed    Lower Extremity Assessment Lower Extremity Assessment: Overall WFL for tasks assessed    Cervical / Trunk Assessment Cervical / Trunk Assessment: Normal  Communication   Communication: No difficulties  Cognition Arousal/Alertness: Awake/alert Behavior During Therapy: WFL for tasks assessed/performed Overall  Cognitive Status: Within Functional Limits for tasks assessed                                        General Comments General comments (skin integrity, edema, etc.): significant anxiety regarding topic of rehab due to past experiences with loved ones passing away. Motivated to work towards d/c to son's house        Assessment/Plan    PT Assessment Patient needs continued PT services  PT Problem List Decreased strength;Decreased activity tolerance;Decreased balance;Decreased knowledge of use of DME       PT Treatment Interventions Gait training;Stair training;DME instruction;Functional mobility training;Therapeutic activities;Therapeutic exercise;Balance training;Patient/family education    PT Goals (Current goals can be found in the Care Plan section)  Acute Rehab PT Goals Patient Stated Goal: return home with family to assist PT Goal Formulation: With patient Time For Goal Achievement: 07/10/20 Potential to Achieve Goals: Good    Frequency Min 3X/week   Barriers to discharge   pt  has a flight of stairs to navigate at son's house       AM-PAC PT "6 Clicks" Mobility  Outcome Measure Help needed turning from your back to your side while in a flat bed without using bedrails?: None Help needed moving from lying on your back to sitting on the side of a flat bed without using bedrails?: None Help needed moving to and from a bed to a chair (including a wheelchair)?: A Little Help needed standing up from a chair using your arms (e.g., wheelchair or bedside chair)?: A Little Help needed to walk in hospital room?: A Little Help needed climbing 3-5 steps with a railing? : A Lot 6 Click Score: 19    End of Session   Activity Tolerance: Patient limited by fatigue;Other (comment) (dizziness) Patient left: in bed;with call bell/phone within reach;with nursing/sitter in room (sitting upright in bed) Nurse Communication: Mobility status PT Visit Diagnosis: Difficulty  in walking, not elsewhere classified (R26.2)    Time: 2924-4628 PT Time Calculation (min) (ACUTE ONLY): 27 min   Charges:   PT Evaluation $PT Eval Moderate Complexity: 1 Mod PT Treatments $Therapeutic Activity: 8-22 mins        Donna Howe, PT, DPT   Acute Rehabilitation Department Pager #: [phone]  Otho Bellows 06/26/2020, 6:01 PM

## 2020-06-27 DIAGNOSIS — I25110 Atherosclerotic heart disease of native coronary artery with unstable angina pectoris: Secondary | ICD-10-CM | POA: Diagnosis not present

## 2020-06-27 DIAGNOSIS — R001 Bradycardia, unspecified: Secondary | ICD-10-CM | POA: Diagnosis not present

## 2020-06-27 DIAGNOSIS — I5032 Chronic diastolic (congestive) heart failure: Secondary | ICD-10-CM | POA: Diagnosis not present

## 2020-06-27 DIAGNOSIS — N186 End stage renal disease: Secondary | ICD-10-CM | POA: Diagnosis not present

## 2020-06-27 LAB — BASIC METABOLIC PANEL
Anion gap: 11 (ref 5–15)
Anion gap: 12 (ref 5–15)
BUN: 36 mg/dL — ABNORMAL HIGH (ref 8–23)
BUN: 52 mg/dL — ABNORMAL HIGH (ref 8–23)
CO2: 26 mmol/L (ref 22–32)
CO2: 23 mmol/L (ref 22–32)
Calcium: 8.7 mg/dL — ABNORMAL LOW (ref 8.9–10.3)
Calcium: 8.7 mg/dL — ABNORMAL LOW (ref 8.9–10.3)
Chloride: 99 mmol/L (ref 98–111)
Chloride: 95 mmol/L — ABNORMAL LOW (ref 98–111)
Creatinine, Ser: 5.09 mg/dL — ABNORMAL HIGH (ref 0.44–1.00)
Creatinine, Ser: 5.91 mg/dL — ABNORMAL HIGH (ref 0.44–1.00)
GFR, Estimated: 8 mL/min — ABNORMAL LOW (ref >60)
GFR, Estimated: 7 mL/min — ABNORMAL LOW (ref >60)
Glucose, Bld: 233 mg/dL — ABNORMAL HIGH (ref 70–99)
Glucose, Bld: 424 mg/dL — ABNORMAL HIGH (ref 70–99)
Potassium: 4.5 mmol/L (ref 3.5–5.1)
Potassium: 5.3 mmol/L — ABNORMAL HIGH (ref 3.5–5.1)
Sodium: 136 mmol/L (ref 135–145)
Sodium: 130 mmol/L — ABNORMAL LOW (ref 135–145)

## 2020-06-27 LAB — GLUCOSE, CAPILLARY
Glucose-Capillary: 246 mg/dL — ABNORMAL HIGH (ref 70–99)
Glucose-Capillary: 248 mg/dL — ABNORMAL HIGH (ref 70–99)
Glucose-Capillary: 378 mg/dL — ABNORMAL HIGH (ref 70–99)
Glucose-Capillary: 415 mg/dL — ABNORMAL HIGH (ref 70–99)

## 2020-06-27 MED ORDER — INSULIN GLARGINE 100 UNIT/ML ~~LOC~~ SOLN
15.0000 [IU] | Freq: Every day | SUBCUTANEOUS | Status: DC
  Administered 2020-06-28 – 2020-06-30 (×3): 15 [IU] via SUBCUTANEOUS
  Filled 2020-06-27 (×3): qty 0.15

## 2020-06-27 MED ORDER — OXYCODONE HCL 5 MG PO TABS
5.0000 mg | ORAL_TABLET | ORAL | Status: DC | PRN
  Administered 2020-06-29: 5 mg via ORAL
  Filled 2020-06-27: qty 1

## 2020-06-27 MED ORDER — PREDNISONE 20 MG PO TABS
50.0000 mg | ORAL_TABLET | Freq: Every day | ORAL | Status: AC
  Administered 2020-06-27: 50 mg via ORAL
  Filled 2020-06-27: qty 2

## 2020-06-27 MED ORDER — INSULIN ASPART 100 UNIT/ML ~~LOC~~ SOLN
0.0000 [IU] | Freq: Three times a day (TID) | SUBCUTANEOUS | Status: DC
  Administered 2020-06-27: 15 [IU] via SUBCUTANEOUS
  Administered 2020-06-28: 5 [IU] via SUBCUTANEOUS
  Administered 2020-06-28 – 2020-06-29 (×2): 3 [IU] via SUBCUTANEOUS
  Administered 2020-06-29: 8 [IU] via SUBCUTANEOUS
  Administered 2020-06-30: 5 [IU] via SUBCUTANEOUS

## 2020-06-27 MED ORDER — DICLOFENAC SODIUM 1 % EX GEL
2.0000 g | Freq: Four times a day (QID) | CUTANEOUS | Status: DC
  Administered 2020-06-27 – 2020-06-29 (×6): 2 g via TOPICAL
  Filled 2020-06-27: qty 100

## 2020-06-27 MED ORDER — TORSEMIDE 20 MG PO TABS
100.0000 mg | ORAL_TABLET | Freq: Every day | ORAL | Status: DC
  Administered 2020-06-27: 100 mg via ORAL
  Filled 2020-06-27 (×2): qty 5

## 2020-06-27 NOTE — Progress Notes (Addendum)
[facility] Progress Note  Name: Donna Howe MRN: [id number] DOB: Jul 20, 1956  Subjective: No new issues, feeling ok this AM.  Poor appetite continues.  VVS planning AV access early in week (likely Tues).  No CP.    Intake/Output Summary (Last 24 hours) at 06/27/2020 0957 Last data filed at 06/27/2020 8366 Gross per 24 hour  Intake 649 ml  Output 375 ml  Net 274 ml    Vitals:  Vitals:   06/26/20 1935 06/27/20 0245 06/27/20 0247 06/27/20 0816  BP: 100/68  135/66 (!) 130/56  Pulse:   80 63  Resp: 18  17 18   Temp: 98.7 F (37.1 C)  98.5 F (36.9 C) 97.8 F (36.6 C)  TempSrc: Oral  Oral Oral  SpO2:   100% 94%  Weight:  94.6 kg    Height:         Physical Exam:  General: adult female in bed in NAD  HEENT: NCAT Eyes: EOMI sclera anicteric Neck: supple trachea midline Heart: S1S2 no rub Lungs: clear with normal WOB Abdomen: softly dist/obese habitus/nontender Extremities: trace LE Edema Skin: no rash on extremities exposed Psych normal mood and affect Neuro: conversant  Medications reviewed   Labs:  BMP Latest Ref Rng & Units 06/27/2020 06/26/2020 06/25/2020  Glucose 70 - 99 mg/dL 233(H) 197(H) 197(H)  BUN 8 - 23 mg/dL 36(H) 19 42(H)  Creatinine 0.44 - 1.00 mg/dL 5.09(H) 3.41(H) 4.74(H)  Sodium 135 - 145 mmol/L 136 140 137  Potassium 3.5 - 5.1 mmol/L 4.5 4.5 3.7  Chloride 98 - 111 mmol/L 99 102 98  CO2 22 - 32 mmol/L 26 27 28   Calcium 8.9 - 10.3 mg/dL 8.7(L) 8.7(L) 8.4(L)     Assessment/Plan:   # NSTEMI / CAD/ angina - s/p LHC 10/5 with diffuse disease --> no CABG targets, cardiology discussed and no PCI options - noted cardiology has stopped beta blocker due to bradycardia -Medically managing CAD - add imdur if BP will tolerate - won't currently  # CKD stage V with progression to end-stage renal disease -s/p TDC 10/4, 1st HD 10/4 and ongoing since then, last 10/8.  HD 10/11 given AVF surgery 10/12, then next planned HD Thurs (hopefully  will be outpt by then) -  VVS planning AV access early week prior to discharge. - Set up for TTS HD at [facility] on d/c, see renal navigator note for details.  # Hypokalemia:  Supplemented to normal - on KCl po and 4K dialysate  # Acute on chronic diastolic CHF  - cont po diuretics and on discharge.  I think she will tolerate her HD better if her need for UF is limited.  Her UOP is quite good.  - UF with HD as tolerated  # HTN  - BPs have been reasonable bordering on low  # Anemia of CKD  - started ESA - Transfuse as needed  - appears iron is ok (ferritin low but iron level and tsat ok) --> D/c po iron as she will be followed closely for need for IV iron.  #gout - feels flare in R foot; usually takes colchcine - I'd rather she not with ESRD.  Check uric acid, add allopurinol if high.  Pred 50mg  x 1 dose.  Justin Mend, MD 06/27/2020 9:57 AM

## 2020-06-27 NOTE — Progress Notes (Signed)
  Progress Note    06/27/2020 11:25 AM 5 Days Post-Op  Subjective: No overnight issues  Vitals:   06/27/20 0247 06/27/20 0816  BP: 135/66 (!) 130/56  Pulse: 80 63  Resp: 17 18  Temp: 98.5 F (36.9 C) 97.8 F (36.6 C)  SpO2: 100% 94%    Physical Exam: Awake alert oriented On lab respirations Palpable left radial pulse  CBC    Component Value Date/Time   WBC 9.7 06/26/2020 0228   RBC 3.02 (L) 06/26/2020 0228   HGB 8.9 (L) 06/26/2020 0228   HCT 27.9 (L) 06/26/2020 0228   PLT 149 (L) 06/26/2020 0228   MCV 92.4 06/26/2020 0228   MCH 29.5 06/26/2020 0228   MCHC 31.9 06/26/2020 0228   RDW 16.4 (H) 06/26/2020 0228   LYMPHSABS 1.8 06/26/2020 0228   MONOABS 1.6 (H) 06/26/2020 0228   EOSABS 0.1 06/26/2020 0228   BASOSABS 0.0 06/26/2020 0228    BMET    Component Value Date/Time   NA 136 06/27/2020 0220   K 4.5 06/27/2020 0220   CL 99 06/27/2020 0220   CO2 26 06/27/2020 0220   GLUCOSE 233 (H) 06/27/2020 0220   BUN 36 (H) 06/27/2020 0220   CREATININE 5.09 (H) 06/27/2020 0220   CALCIUM 8.7 (L) 06/27/2020 0220   CALCIUM 8.7 10/27/2013 1103   GFRNONAA 8 (L) 06/27/2020 0220   GFRAA 9 (L) 06/21/2020 1043    INR    Component Value Date/Time   INR 1.0 11/26/2019 0700     Intake/Output Summary (Last 24 hours) at 06/27/2020 1125 Last data filed at 06/27/2020 3009 Gross per 24 hour  Intake 646 ml  Output 375 ml  Net 271 ml     Assessment/plan:  64 y.o. female is now end-stage renal disease.  She has a tunneled dialysis catheter.  We will plan for left arm fistula or possibly graft on Tuesday of this week.  We will preop tomorrow.  She will need her IV removed from her left arm prior to discharge.   Donna Howe C. Donzetta Matters, MD [facility] Office: [phone] Pager: [phone]  06/27/2020 11:25 AM

## 2020-06-27 NOTE — Progress Notes (Signed)
CBG 415.  Dr. Marlowe Sax notified.  Order received to give 5 units Novolog and recheck in 4 hours.  Jodell Cipro

## 2020-06-27 NOTE — Progress Notes (Signed)
PROGRESS NOTE    Massiah Longanecker Guardado  KDX:[id number] DOB: 04-05-1956 DOA: 06/18/2020 PCP: [facility]    Brief Narrative:  Patient admitted to the hospital with a working diagnosis of acute kidney injury on chronic kidney disease, complicated by hyperkalemia, uremia and bradycardia.  64 year old female with past medical history for chronic kidney disease stage V, type 2 diabetes mellitus, dyslipidemia, and hypertension. Recent hospitalization at [facility] for non-STEMI. Patient was readmitted today after discharge due to bradycardia. At home patient had generalized weakness, fatigue, confusion and slurred speech. On her initial physical examination her heart rate was 45 bpm, blood pressure 142/32, respiratory rate 18, oxygen saturation 90%. Her lungs were clear to auscultation bilaterally, heart S1-S2, present, bradycardic, soft abdomen, positive bilateral lower extremity edema more right than left. Sodium 132, potassium 6.7, chloride 101, bicarb 20, glucose 306,BUN 106, creatinine 5.18, troponin I 482, BNP 2,129, white count 11.5, hemoglobin 9.2, hematocrit 27.5, platelets 88. SARS COVID-19 negative. EKG 45 bpm, rightward axis, right bundle branch block, junctional rhythm, no significant ST segment changes, negative T wave V1 through V3.  Patient received medical therapy for hyperkalemia including Kayexalate, calcium gluconate, and insulin.  On 10/4 patient underwent placement of tunneled HD catheter in the right internal jugular and posteriorly underwent hemodialysis.   On 10/5 patient underwent coronary angiography showing diffuse and severe coronary artery disease, including native and graft vessels. Positive in-stent restenosis LIMA to LAD. Patient was referred for reevaluation for bypass grafting. She had her first CABG approximately 16 years ago.  Final recommendations from cardiology to continue with aggressive medical management.   Patient will  get permament access placed before discharge.    Assessment & Plan:   Principal Problem:   End stage renal disease ([place]) Active Problems:   Class 2 obesity   Bradycardia   Diabetes mellitus with renal manifestation (HCC)   Atherosclerosis of native artery of extremity with intermittent claudication (HCC)   CAD (coronary artery disease)   Carotid artery disease (HCC)   Anemia   Chronic diastolic HF (heart failure) (HCC)   Hyperkalemia   1. Hyperkalemia due to progression of CKD to end stage. Urine output is documented 375 ml over last 24 H. Her blood pressure has improved. No significant lower extremity edema or dyspnea.   Follow with nephrology recommendations for further renal replacement therapy while hospitalized. She has been resumed on torsemide to help in volume control.   Scheduled for fistula creation on 06/29/20.   2. Severe coronary artery disease with recent NSTEMI/ acute on chronic diastolic heart failure. Echocardiogram with preserved LV systolic function 55 to 97%. Holding b blockade or RAAS inhibition due to risk of hypotension.  No further chest pain, continue medical therapy with aspirin, clopidogrel and high dose high potency statin with atorvastatin (80 mg).  3. HTN.blood pressure 130 to 673 mmHg systolic.   4. Controlled T2DM. Fasting glucose 233 with capillary 246 and 248. Continue with insulin sliding scale and will increase basal insulin to 15 units.   Improved po intake.   5. Anemia of chronic renal disease/ iron deficiency.Iron 146, TIBC 239, transferrin saturation 61. Sp one unit prbc transfusion.   Continue with EPO.   6. Anxiety.Continue with alprazolam PRN and qhs.   7. Hypothyroid.Continue with levothyroxine.  8. Acute right foot gout. Base of first metatarsal of right foot. Add topical diclofenac qid and as needed oxycodone for pain control.  Patient had prednisone and will follow up on uric acid levels.  Status is:  Inpatient  Remains inpatient appropriate because:Inpatient level of care appropriate due to severity of illness   Dispo: The patient is from: Home              Anticipated d/c is to: Home              Anticipated d/c date is: 2 days              Patient currently is not medically stable to d/c. pending placement of permanent HD access AV fistula.    DVT prophylaxis: Heparin   Code Status:   full  Family Communication:  No family at the bedside      Nutrition Status: Nutrition Problem: Inadequate oral intake Etiology: nausea, decreased appetite Signs/Symptoms: per patient/family report Interventions: Ensure Enlive (each supplement provides 350kcal and 20 grams of protein), MVI, Education      Consultants:   Nephrology   Cardiology     Subjective: Patient with significant pain on right foot, tender to palpation, no radiation, mild improvement with analgesics, no nausea or vomiting,   Objective: Vitals:   06/26/20 1935 06/27/20 0245 06/27/20 0247 06/27/20 0816  BP: 100/68  135/66 (!) 130/56  Pulse:   80 63  Resp: 18  17 18   Temp: 98.7 F (37.1 C)  98.5 F (36.9 C) 97.8 F (36.6 C)  TempSrc: Oral  Oral Oral  SpO2:   100% 94%  Weight:  94.6 kg    Height:        Intake/Output Summary (Last 24 hours) at 06/27/2020 1154 Last data filed at 06/27/2020 0814 Gross per 24 hour  Intake 526 ml  Output 375 ml  Net 151 ml   Filed Weights   06/25/20 1730 06/26/20 0556 06/27/20 0245  Weight: 95 kg 96.9 kg 94.6 kg    Examination:   General: Not in pain or dyspnea, deconditioned  Neurology: Awake and alert, non focal  E ENT: no pallor, no icterus, oral mucosa moist Cardiovascular: No JVD. S1-S2 present, rhythmic, no gallops, rubs, or murmurs. Trace lower extremity edema. Pulmonary: positive breath sounds bilaterally, adequate air movement, no wheezing, rhonchi or rales. Gastrointestinal. Abdomen soft and non tender Skin. No rashes Musculoskeletal: tender base of  first metatarsal on right foot, mild erythema, not change in temperature.      Data Reviewed: I have personally reviewed following labs and imaging studies  CBC: Recent Labs  Lab 06/22/20 0725 06/23/20 0354 06/24/20 0359 06/25/20 0135 06/26/20 0228  WBC 7.7 8.6 9.6 9.9 9.7  NEUTROABS  --   --   --  6.3 6.1  HGB 7.7* 7.1* 7.5* 7.4* 8.9*  HCT 23.2* 22.6* 23.4* 23.1* 27.9*  MCV 92.4 92.6 93.2 93.5 92.4  PLT 104* 118* 122* 127* 481*   Basic Metabolic Panel: Recent Labs  Lab 06/23/20 0354 06/24/20 0359 06/25/20 0135 06/26/20 0228 06/27/20 0220  NA 137 140 137 140 136  K 3.5 3.4* 3.7 4.5 4.5  CL 96* 99 98 102 99  CO2 29 29 28 27 26   GLUCOSE 138* 194* 197* 197* 233*  BUN 58* 34* 42* 19 36*  CREATININE 4.21* 3.77* 4.74* 3.41* 5.09*  CALCIUM 8.5* 8.5* 8.4* 8.7* 8.7*   GFR: Estimated Creatinine Clearance: 13.4 mL/min (A) (by C-G formula based on SCr of 5.09 mg/dL (H)). Liver Function Tests: No results for input(s): AST, ALT, ALKPHOS, BILITOT, PROT, ALBUMIN in the last 168 hours. No results for input(s): LIPASE, AMYLASE in the last 168 hours. No results for  input(s): AMMONIA in the last 168 hours. Coagulation Profile: No results for input(s): INR, PROTIME in the last 168 hours. Cardiac Enzymes: No results for input(s): CKTOTAL, CKMB, CKMBINDEX, TROPONINI in the last 168 hours. BNP (last 3 results) No results for input(s): PROBNP in the last 8760 hours. HbA1C: No results for input(s): HGBA1C in the last 72 hours. CBG: Recent Labs  Lab 06/26/20 0724 06/26/20 1207 06/26/20 1716 06/26/20 2044 06/27/20 0814  GLUCAP 212* 298* 176* 241* 246*   Lipid Profile: No results for input(s): CHOL, HDL, LDLCALC, TRIG, CHOLHDL, LDLDIRECT in the last 72 hours. Thyroid Function Tests: No results for input(s): TSH, T4TOTAL, FREET4, T3FREE, THYROIDAB in the last 72 hours. Anemia Panel: No results for input(s): VITAMINB12, FOLATE, FERRITIN, TIBC, IRON, RETICCTPCT in the last 72  hours.    Radiology Studies: I have reviewed all of the imaging during this hospital visit personally     Scheduled Meds: . ALPRAZolam  0.25 mg Oral BID  . ALPRAZolam  0.5 mg Oral QHS  . aspirin EC  81 mg Oral Daily  . atorvastatin  80 mg Oral QPM  . Chlorhexidine Gluconate Cloth  6 each Topical Q0600  . Chlorhexidine Gluconate Cloth  6 each Topical Q0600  . clopidogrel  75 mg Oral Daily  . darbepoetin (ARANESP) injection - DIALYSIS  100 mcg Intravenous Q Fri-HD  . feeding supplement (ENSURE ENLIVE)  237 mL Oral BID BM  . heparin  5,000 Units Subcutaneous Q8H  . insulin aspart  0-5 Units Subcutaneous QHS  . insulin aspart  0-9 Units Subcutaneous TID WC  . insulin glargine  10 Units Subcutaneous Daily  . levothyroxine  125 mcg Oral Daily  . multivitamin  1 tablet Oral QHS  . pantoprazole  40 mg Oral BID  . sodium chloride flush  3 mL Intravenous Q12H  . sodium chloride flush  3 mL Intravenous Q12H  . torsemide  100 mg Oral Daily  . Vitamin D (Ergocalciferol)  50,000 Units Oral Q Mon   Continuous Infusions: . sodium chloride    . nitroGLYCERIN Stopped (06/23/20 0207)     LOS: 9 days        Palin Tristan Gerome Apley, MD

## 2020-06-28 DIAGNOSIS — N186 End stage renal disease: Secondary | ICD-10-CM | POA: Diagnosis not present

## 2020-06-28 DIAGNOSIS — E669 Obesity, unspecified: Secondary | ICD-10-CM | POA: Diagnosis not present

## 2020-06-28 DIAGNOSIS — I25110 Atherosclerotic heart disease of native coronary artery with unstable angina pectoris: Secondary | ICD-10-CM | POA: Diagnosis not present

## 2020-06-28 DIAGNOSIS — R001 Bradycardia, unspecified: Secondary | ICD-10-CM | POA: Diagnosis not present

## 2020-06-28 LAB — BASIC METABOLIC PANEL
Anion gap: 15 (ref 5–15)
BUN: 58 mg/dL — ABNORMAL HIGH (ref 8–23)
CO2: 23 mmol/L (ref 22–32)
Calcium: 8.9 mg/dL (ref 8.9–10.3)
Chloride: 94 mmol/L — ABNORMAL LOW (ref 98–111)
Creatinine, Ser: 6.11 mg/dL — ABNORMAL HIGH (ref 0.44–1.00)
GFR, Estimated: 7 mL/min — ABNORMAL LOW (ref >60)
Glucose, Bld: 391 mg/dL — ABNORMAL HIGH (ref 70–99)
Potassium: 5 mmol/L (ref 3.5–5.1)
Sodium: 132 mmol/L — ABNORMAL LOW (ref 135–145)

## 2020-06-28 LAB — CBC
HCT: 27.9 % — ABNORMAL LOW (ref 36.0–46.0)
Hemoglobin: 9.1 g/dL — ABNORMAL LOW (ref 12.0–15.0)
MCH: 29.4 pg (ref 26.0–34.0)
MCHC: 32.6 g/dL (ref 30.0–36.0)
MCV: 90 fL (ref 80.0–100.0)
Platelets: 196 10*3/uL (ref 150–400)
RBC: 3.1 MIL/uL — ABNORMAL LOW (ref 3.87–5.11)
RDW: 15.4 % (ref 11.5–15.5)
WBC: 11.7 10*3/uL — ABNORMAL HIGH (ref 4.0–10.5)
nRBC: 0.2 % (ref 0.0–0.2)

## 2020-06-28 LAB — GLUCOSE, CAPILLARY
Glucose-Capillary: 333 mg/dL — ABNORMAL HIGH (ref 70–99)
Glucose-Capillary: 156 mg/dL — ABNORMAL HIGH (ref 70–99)
Glucose-Capillary: 238 mg/dL — ABNORMAL HIGH (ref 70–99)
Glucose-Capillary: 221 mg/dL — ABNORMAL HIGH (ref 70–99)

## 2020-06-28 LAB — URIC ACID: Uric Acid, Serum: 6.5 mg/dL (ref 2.5–7.1)

## 2020-06-28 MED ORDER — SODIUM CHLORIDE 0.9 % IV SOLN: 100.0000 mL | INTRAVENOUS | Status: DC | PRN

## 2020-06-28 MED ORDER — LIDOCAINE-PRILOCAINE 2.5-2.5 % EX CREA: 1.0000 "application " | TOPICAL_CREAM | CUTANEOUS | Status: DC | PRN

## 2020-06-28 MED ORDER — ALLOPURINOL 100 MG PO TABS
100.0000 mg | ORAL_TABLET | Freq: Once | ORAL | Status: AC
  Administered 2020-06-28: 100 mg via ORAL

## 2020-06-28 MED ORDER — INSULIN ASPART 100 UNIT/ML ~~LOC~~ SOLN
4.0000 [IU] | Freq: Once | SUBCUTANEOUS | Status: AC
  Administered 2020-06-28: 4 [IU] via SUBCUTANEOUS

## 2020-06-28 MED ORDER — INSULIN ASPART 100 UNIT/ML ~~LOC~~ SOLN
3.0000 [IU] | Freq: Three times a day (TID) | SUBCUTANEOUS | Status: DC
  Administered 2020-06-28 – 2020-06-30 (×4): 3 [IU] via SUBCUTANEOUS

## 2020-06-28 MED ORDER — LIDOCAINE HCL (PF) 1 % IJ SOLN: 5.0000 mL | INTRAMUSCULAR | Status: DC | PRN

## 2020-06-28 MED ORDER — ALTEPLASE 2 MG IJ SOLR: 2.0000 mg | Freq: Once | INTRAMUSCULAR | Status: DC | PRN

## 2020-06-28 MED ORDER — HEPARIN SODIUM (PORCINE) 1000 UNIT/ML DIALYSIS
1000.0000 [IU] | INTRAMUSCULAR | Status: DC | PRN
  Administered 2020-06-28: 1000 [IU] via INTRAVENOUS_CENTRAL

## 2020-06-28 MED ORDER — HEPARIN SODIUM (PORCINE) 1000 UNIT/ML IJ SOLN
INTRAMUSCULAR | Status: AC
  Filled 2020-06-28: qty 4

## 2020-06-28 MED ORDER — ALLOPURINOL 100 MG PO TABS: 100.0000 mg | ORAL_TABLET | ORAL | Status: DC

## 2020-06-28 MED ORDER — VANCOMYCIN HCL IN DEXTROSE 1-5 GM/200ML-% IV SOLN
1000.0000 mg | INTRAVENOUS | Status: AC
  Administered 2020-06-29: 1000 mg via INTRAVENOUS
  Filled 2020-06-28: qty 200

## 2020-06-28 MED ORDER — PENTAFLUOROPROP-TETRAFLUOROETH EX AERO: 1.0000 "application " | INHALATION_SPRAY | CUTANEOUS | Status: DC | PRN

## 2020-06-28 NOTE — Progress Notes (Signed)
Pt for left arm access tomorrow by Dr. Donzetta Matters.   Pt has been pre-op'd.    Npo after MN/consent/labs.     Leontine Locket, [facility] 06/28/2020 7:55 AM

## 2020-06-28 NOTE — Progress Notes (Signed)
PROGRESS NOTE    Donna Howe  BUL:[id number] DOB: [birth year] DOA: 06/18/2020 PCP: [facility]    Brief Narrative:  Patient admitted to the hospital with a working diagnosis of acute kidney injury on chronic kidney disease, complicated by hyperkalemia, uremia and bradycardia.  64 year old female with past medical history for chronic kidney disease stage V, type 2 diabetes mellitus, dyslipidemia, and hypertension. Recent hospitalization at [facility] for non-STEMI. Patient was readmitted today after discharge due to bradycardia. At home patient had generalized weakness, fatigue, confusion and slurred speech. On her initial physical examination her heart rate was 45 bpm, blood pressure 142/32, respiratory rate 18, oxygen saturation 90%. Her lungs were clear to auscultation bilaterally, heart S1-S2, present, bradycardic, soft abdomen, positive bilateral lower extremity edema more right than left. Sodium 132, potassium 6.7, chloride 101, bicarb 20, glucose 306,BUN 106, creatinine 5.18, troponin I 482, BNP 2,129, white count 11.5, hemoglobin 9.2, hematocrit 27.5, platelets 88. SARS COVID-19 negative. EKG 45 bpm, rightward axis, right bundle branch block, junctional rhythm, no significant ST segment changes, negative T wave V1 through V3.  Patient received medical therapy for hyperkalemia including Kayexalate, calcium gluconate, and insulin.  On 10/4 patient underwent placement of tunneled HD catheter in the right internal jugular and posteriorly underwent hemodialysis.   On 10/5 patient underwent coronary angiography showing diffuse and severe coronary artery disease, including native and graft vessels. Positive in-stent restenosis LIMA to LAD. Patient was referred for reevaluation for bypass grafting. She had her first CABG approximately 16 years ago.  Final recommendations from cardiology to continue with aggressive medical management.   Patient will  get permament access on 10/12 and plan for dc home on 10/13.    Assessment & Plan:   Principal Problem:   End stage renal disease ([place]) Active Problems:   Class 2 obesity   Bradycardia   Diabetes mellitus with renal manifestation (HCC)   Atherosclerosis of native artery of extremity with intermittent claudication (HCC)   CAD (coronary artery disease)   Carotid artery disease (HCC)   Anemia   Chronic diastolic HF (heart failure) (HCC)   Hyperkalemia   1. Hyperkalemia due to progression of CKD to end stage. Urine output is documented 500 ml over last 24 H. Stable blood pressure 119 mmHg. Continue to improve volume status.  Will hold on torsemide in the setting of hyperuricemia. Continue ultrafiltration on HD now that blood pressure has improved.   Scheduled for fistula creation on 06/29/20.   2. Severe coronary artery disease with recent NSTEMI/ acute on chronic diastolic heart failure. Echocardiogram with preserved LV systolic function 55 to 32%. Holdingb blockade or RAAS inhibition due to risk of hypotension. Patient with no angina.  Continue with aspirin, clopidogrel andhigh dose high potencystatin with atorvastatin(80 mg). Keep Hgb above 8.0  3. HTN.Stable blood pressure, tolerating well HD.   4. Unontrolled T2DM/ steroid induced hyperglycemia. sp 60 mg of prednisone.   Continue basal insulin with 15 units and sliding scale moderate sensitivity, add pre-meal insulun 3 units for now. She is tolerating po well.  I  5. Anemia of chronic renal disease/ iron deficiency.Iron 146, TIBC 239, transferrin saturation 61.Sp one unit prbc transfusion.  Keep Hgb above 8 due to CAD.  Continue with EPO, follow on cell count as needed.    6. Anxiety.On alprazolam PRN and qhs.   7. Hypothyroid.Onlevothyroxine.  8. Acute right foot gout. Uric acid level at 6,5, will avoid further steroids due to hyperglycemia, continue with local diclofenac  and will add renal dose  allopurinol, 100 mg after HD.     Status is: Inpatient  Remains inpatient appropriate because:Inpatient level of care appropriate due to severity of illness   Dispo: The patient is from: Home              Anticipated d/c is to: Home              Anticipated d/c date is: 2 days              Patient currently is not medically stable to d/c. Plan for dc home on 06/30/20    DVT prophylaxis: Heparin   Code Status:   full  Family Communication:  No family at the bedside      Nutrition Status: Nutrition Problem: Inadequate oral intake Etiology: nausea, decreased appetite Signs/Symptoms: per patient/family report Interventions: Ensure Enlive (each supplement provides 350kcal and 20 grams of protein), MVI, Education      Consultants:   Nephrology   Cardiology   Vascular surgery   Procedures:   HD catheter  Cardiac catheterization \  Subjective: Patient reports improvement of right foot pain, no nausea or vomiting, currently with fatigue post HD, no chest pain or dyspnea.   Objective: Vitals:   06/28/20 1127 06/28/20 1142 06/28/20 1157 06/28/20 1222  BP:   (!) 115/53 (!) 119/54  Pulse:    81  Resp: 11 (!) 23 20 19   Temp:   (!) 97.5 F (36.4 C) 98.1 F (36.7 C)  TempSrc:   Oral Oral  SpO2:   98% 100%  Weight:   95 kg   Height:        Intake/Output Summary (Last 24 hours) at 06/28/2020 1242 Last data filed at 06/28/2020 1157 Gross per 24 hour  Intake 360 ml  Output 2100 ml  Net -1740 ml   Filed Weights   06/28/20 0256 06/28/20 0741 06/28/20 1157  Weight: 95.4 kg 96.7 kg 95 kg    Examination:   General: Not in pain or dyspnea,. Deconditioned  Neurology: Awake and alert, non focal  E ENT: mild pallor, no icterus, oral mucosa moist Cardiovascular: No JVD. S1-S2 present, rhythmic, no gallops, rubs, or murmurs. No lower extremity edema. Pulmonary: positive breath sounds bilaterally, no wheezing, rhonchi or rales. Gastrointestinal. Abdomen soft and non  tender Skin. No rashes Musculoskeletal: tender right foot first toe at the lateral aspect of the base.      Data Reviewed: I have personally reviewed following labs and imaging studies  CBC: Recent Labs  Lab 06/23/20 0354 06/24/20 0359 06/25/20 0135 06/26/20 0228 06/28/20 0839  WBC 8.6 9.6 9.9 9.7 11.7*  NEUTROABS  --   --  6.3 6.1  --   HGB 7.1* 7.5* 7.4* 8.9* 9.1*  HCT 22.6* 23.4* 23.1* 27.9* 27.9*  MCV 92.6 93.2 93.5 92.4 90.0  PLT 118* 122* 127* 149* 741   Basic Metabolic Panel: Recent Labs  Lab 06/25/20 0135 06/26/20 0228 06/27/20 0220 06/27/20 2213 06/28/20 0358  NA 137 140 136 130* 132*  K 3.7 4.5 4.5 5.3* 5.0  CL 98 102 99 95* 94*  CO2 28 27 26 23 23   GLUCOSE 197* 197* 233* 424* 391*  BUN 42* 19 36* 52* 58*  CREATININE 4.74* 3.41* 5.09* 5.91* 6.11*  CALCIUM 8.4* 8.7* 8.7* 8.7* 8.9   GFR: Estimated Creatinine Clearance: 11.2 mL/min (A) (by C-G formula based on SCr of 6.11 mg/dL (H)). Liver Function Tests: No results for input(s): AST, ALT, ALKPHOS, BILITOT, PROT,  ALBUMIN in the last 168 hours. No results for input(s): LIPASE, AMYLASE in the last 168 hours. No results for input(s): AMMONIA in the last 168 hours. Coagulation Profile: No results for input(s): INR, PROTIME in the last 168 hours. Cardiac Enzymes: No results for input(s): CKTOTAL, CKMB, CKMBINDEX, TROPONINI in the last 168 hours. BNP (last 3 results) No results for input(s): PROBNP in the last 8760 hours. HbA1C: No results for input(s): HGBA1C in the last 72 hours. CBG: Recent Labs  Lab 06/27/20 1154 06/27/20 1633 06/27/20 2032 06/28/20 0231 06/28/20 1220  GLUCAP 248* 378* 415* 333* 156*   Lipid Profile: No results for input(s): CHOL, HDL, LDLCALC, TRIG, CHOLHDL, LDLDIRECT in the last 72 hours. Thyroid Function Tests: No results for input(s): TSH, T4TOTAL, FREET4, T3FREE, THYROIDAB in the last 72 hours. Anemia Panel: No results for input(s): VITAMINB12, FOLATE, FERRITIN, TIBC,  IRON, RETICCTPCT in the last 72 hours.    Radiology Studies: I have reviewed all of the imaging during this hospital visit personally     Scheduled Meds: . ALPRAZolam  0.25 mg Oral BID  . ALPRAZolam  0.5 mg Oral QHS  . aspirin EC  81 mg Oral Daily  . atorvastatin  80 mg Oral QPM  . Chlorhexidine Gluconate Cloth  6 each Topical Q0600  . Chlorhexidine Gluconate Cloth  6 each Topical Q0600  . clopidogrel  75 mg Oral Daily  . darbepoetin (ARANESP) injection - DIALYSIS  100 mcg Intravenous Q Fri-HD  . diclofenac Sodium  2 g Topical QID  . feeding supplement (ENSURE ENLIVE)  237 mL Oral BID BM  . heparin  5,000 Units Subcutaneous Q8H  . heparin sodium (porcine)      . insulin aspart  0-15 Units Subcutaneous TID WC  . insulin aspart  0-5 Units Subcutaneous QHS  . insulin glargine  15 Units Subcutaneous Daily  . levothyroxine  125 mcg Oral Daily  . multivitamin  1 tablet Oral QHS  . pantoprazole  40 mg Oral BID  . sodium chloride flush  3 mL Intravenous Q12H  . sodium chloride flush  3 mL Intravenous Q12H  . torsemide  100 mg Oral Daily  . Vitamin D (Ergocalciferol)  50,000 Units Oral Q Mon   Continuous Infusions: . sodium chloride    . nitroGLYCERIN Stopped (06/23/20 0207)  . [START ON 06/29/2020] vancomycin       LOS: 10 days        Amaiya Scruton Gerome Apley, MD

## 2020-06-28 NOTE — Care Management (Addendum)
Case Manager spoke with patient to discuss recommendation for Home Health. She states that she is already overwhelmed with everything that is going on right now and would like to wait to see if she really needs it. Patient states she is  Going home with her son and along with family will ensure all her needs are met. Patient id confident it is only a matter of time before she regains her strength. Case manager and patient discussed this and CM listened to her concerns, feelings and thoughts about what is going on in her life right now. Patient will let her MD know should she get home and need therapy. TOC Team will continue to monitor. Ricki Miller, RN BSN Case Manager [phone]

## 2020-06-28 NOTE — Progress Notes (Signed)
Inpatient Diabetes Program Recommendations  AACE/ADA: New Consensus Statement on Inpatient Glycemic Control   Target Ranges:  Prepandial:   less than 140 mg/dL      Peak postprandial:   less than 180 mg/dL (1-2 hours)      Critically ill patients:  140 - 180 mg/dL   Results for Donna Howe, Donna Howe (MRN [id number]) as of 06/28/2020 07:45  Ref. Range 06/27/2020 08:14 06/27/2020 11:54 06/27/2020 16:33 06/27/2020 20:32 06/28/2020 02:31  Glucose-Capillary Latest Ref Range: 70 - 99 mg/dL 246 (H) 248 (H) 378 (H) 415 (H) 333 (H)   Review of Glycemic Control  Diabetes history: DM2 Outpatient Diabetes medications: 70/30 24 units BID Current orders for Inpatient glycemic control: Lantus 15 units daily, Novolog 0-15 units TID with meals, Novolog 0-5 units QHS  Inpatient Diabetes Program Recommendations:    Insulin: Please consider ordering Novolog 3 units TID with meals for meal coverage if patient eats at least 50% of meals.  NOTE: Noted patient got Lantus 10 units yesterday and will get 15 units today. Also noted patient received Prednisone 50 mg on 06/27/20 which is contributing to hyperglycemia.  Thanks, Barnie Alderman, RN, MSN, CDE Diabetes Coordinator Inpatient Diabetes Program [phone] (Team Pager from 8am to 5pm)

## 2020-06-28 NOTE — Plan of Care (Signed)
  Problem: Clinical Measurements: Goal: Respiratory complications will improve Outcome: Progressing Goal: Cardiovascular complication will be avoided Outcome: Progressing   

## 2020-06-28 NOTE — Plan of Care (Signed)
  Problem: Clinical Measurements: Goal: Respiratory complications will improve 06/28/2020 0350 by Colonel Bald, RN Outcome: Progressing 06/28/2020 0348 by Colonel Bald, RN Outcome: Progressing Goal: Cardiovascular complication will be avoided 06/28/2020 0350 by Colonel Bald, RN Outcome: Progressing 06/28/2020 0348 by Colonel Bald, RN Outcome: Progressing

## 2020-06-28 NOTE — Procedures (Signed)
I was present at this dialysis session. I have reviewed the session itself and made appropriate changes.   Seen on HD. K 5, move ot 3K bath. UF goal 2L.  VVS to placed AV access tomorrow.  Using TDC Qb 400.   Hb 9.1, on aranesp 100 qFri, 9/27 Ferritin 4 and TSAT 61%.  If Hb falls wil give IV Fe.   Filed Weights   06/27/20 0245 06/28/20 0256 06/28/20 0741  Weight: 94.6 kg 95.4 kg 96.7 kg    Recent Labs  Lab 06/28/20 0358  NA 132*  K 5.0  CL 94*  CO2 23  GLUCOSE 391*  BUN 58*  CREATININE 6.11*  CALCIUM 8.9    Recent Labs  Lab 06/25/20 0135 06/26/20 0228 06/28/20 0839  WBC 9.9 9.7 11.7*  NEUTROABS 6.3 6.1  --   HGB 7.4* 8.9* 9.1*  HCT 23.1* 27.9* 27.9*  MCV 93.5 92.4 90.0  PLT 127* 149* 196    Scheduled Meds: . ALPRAZolam  0.25 mg Oral BID  . ALPRAZolam  0.5 mg Oral QHS  . aspirin EC  81 mg Oral Daily  . atorvastatin  80 mg Oral QPM  . Chlorhexidine Gluconate Cloth  6 each Topical Q0600  . Chlorhexidine Gluconate Cloth  6 each Topical Q0600  . clopidogrel  75 mg Oral Daily  . darbepoetin (ARANESP) injection - DIALYSIS  100 mcg Intravenous Q Fri-HD  . diclofenac Sodium  2 g Topical QID  . feeding supplement (ENSURE ENLIVE)  237 mL Oral BID BM  . heparin  5,000 Units Subcutaneous Q8H  . heparin sodium (porcine)      . insulin aspart  0-15 Units Subcutaneous TID WC  . insulin aspart  0-5 Units Subcutaneous QHS  . insulin glargine  15 Units Subcutaneous Daily  . levothyroxine  125 mcg Oral Daily  . multivitamin  1 tablet Oral QHS  . pantoprazole  40 mg Oral BID  . sodium chloride flush  3 mL Intravenous Q12H  . sodium chloride flush  3 mL Intravenous Q12H  . torsemide  100 mg Oral Daily  . Vitamin D (Ergocalciferol)  50,000 Units Oral Q Mon   Continuous Infusions: . sodium chloride    . sodium chloride    . sodium chloride    . nitroGLYCERIN Stopped (06/23/20 0207)  . [START ON 06/29/2020] vancomycin     PRN Meds:.sodium chloride, sodium chloride, sodium  chloride, acetaminophen **OR** [DISCONTINUED] acetaminophen, albuterol, alteplase, heparin, lidocaine (PF), lidocaine-prilocaine, nitroGLYCERIN, ondansetron (ZOFRAN) IV, oxyCODONE, pentafluoroprop-tetrafluoroeth, sodium chloride flush   Pearson Grippe  MD 06/28/2020, 9:39 AM

## 2020-06-29 ENCOUNTER — Encounter (HOSPITAL_COMMUNITY): Payer: Self-pay | Admitting: Internal Medicine

## 2020-06-29 ENCOUNTER — Inpatient Hospital Stay (HOSPITAL_COMMUNITY): Payer: Medicare HMO | Admitting: Certified Registered"

## 2020-06-29 ENCOUNTER — Encounter (HOSPITAL_COMMUNITY)
Admission: EM | Disposition: A | Discharge status: Home Health | Payer: Self-pay | Source: Home / Self Care | Attending: Internal Medicine

## 2020-06-29 DIAGNOSIS — N186 End stage renal disease: Secondary | ICD-10-CM | POA: Diagnosis not present

## 2020-06-29 DIAGNOSIS — I25110 Atherosclerotic heart disease of native coronary artery with unstable angina pectoris: Secondary | ICD-10-CM | POA: Diagnosis not present

## 2020-06-29 DIAGNOSIS — I6523 Occlusion and stenosis of bilateral carotid arteries: Secondary | ICD-10-CM | POA: Diagnosis not present

## 2020-06-29 DIAGNOSIS — R001 Bradycardia, unspecified: Secondary | ICD-10-CM | POA: Diagnosis not present

## 2020-06-29 HISTORY — PX: AV FISTULA PLACEMENT: SHX1204

## 2020-06-29 LAB — GLUCOSE, CAPILLARY
Glucose-Capillary: 187 mg/dL — ABNORMAL HIGH (ref 70–99)
Glucose-Capillary: 180 mg/dL — ABNORMAL HIGH (ref 70–99)
Glucose-Capillary: 175 mg/dL — ABNORMAL HIGH (ref 70–99)
Glucose-Capillary: 258 mg/dL — ABNORMAL HIGH (ref 70–99)
Glucose-Capillary: 179 mg/dL — ABNORMAL HIGH (ref 70–99)

## 2020-06-29 LAB — CBC
HCT: 27.4 % — ABNORMAL LOW (ref 36.0–46.0)
Hemoglobin: 8.7 g/dL — ABNORMAL LOW (ref 12.0–15.0)
MCH: 29.2 pg (ref 26.0–34.0)
MCHC: 31.8 g/dL (ref 30.0–36.0)
MCV: 91.9 fL (ref 80.0–100.0)
Platelets: 185 10*3/uL (ref 150–400)
RBC: 2.98 MIL/uL — ABNORMAL LOW (ref 3.87–5.11)
RDW: 16.2 % — ABNORMAL HIGH (ref 11.5–15.5)
WBC: 10.5 10*3/uL (ref 4.0–10.5)
nRBC: 0 % (ref 0.0–0.2)

## 2020-06-29 LAB — BASIC METABOLIC PANEL
Anion gap: 15 (ref 5–15)
BUN: 28 mg/dL — ABNORMAL HIGH (ref 8–23)
CO2: 25 mmol/L (ref 22–32)
Calcium: 8.6 mg/dL — ABNORMAL LOW (ref 8.9–10.3)
Chloride: 96 mmol/L — ABNORMAL LOW (ref 98–111)
Creatinine, Ser: 3.98 mg/dL — ABNORMAL HIGH (ref 0.44–1.00)
GFR, Estimated: 11 mL/min — ABNORMAL LOW (ref >60)
Glucose, Bld: 220 mg/dL — ABNORMAL HIGH (ref 70–99)
Potassium: 3.7 mmol/L (ref 3.5–5.1)
Sodium: 136 mmol/L (ref 135–145)

## 2020-06-29 LAB — SURGICAL PCR SCREEN
MRSA, PCR: NEGATIVE
Staphylococcus aureus: NEGATIVE

## 2020-06-29 SURGERY — ARTERIOVENOUS (AV) FISTULA CREATION
Anesthesia: Monitor Anesthesia Care | Laterality: Left

## 2020-06-29 MED ORDER — AMISULPRIDE (ANTIEMETIC) 5 MG/2ML IV SOLN: 10.0000 mg | Freq: Once | INTRAVENOUS | Status: DC | PRN

## 2020-06-29 MED ORDER — PROPOFOL 500 MG/50ML IV EMUL
INTRAVENOUS | Status: DC | PRN
  Administered 2020-06-29: 30 ug/kg/min via INTRAVENOUS

## 2020-06-29 MED ORDER — LIDOCAINE-EPINEPHRINE (PF) 1 %-1:200000 IJ SOLN
INTRAMUSCULAR | Status: DC | PRN
  Administered 2020-06-29: 30 mL

## 2020-06-29 MED ORDER — CHLORHEXIDINE GLUCONATE 0.12 % MT SOLN
OROMUCOSAL | Status: AC
  Administered 2020-06-29: 15 mL via OROMUCOSAL
  Filled 2020-06-29: qty 15

## 2020-06-29 MED ORDER — MIDAZOLAM HCL 2 MG/2ML IJ SOLN
INTRAMUSCULAR | Status: DC | PRN
  Administered 2020-06-29: 2 mg via INTRAVENOUS

## 2020-06-29 MED ORDER — ONDANSETRON HCL 4 MG/2ML IJ SOLN
INTRAMUSCULAR | Status: AC
  Filled 2020-06-29: qty 2

## 2020-06-29 MED ORDER — OXYCODONE-ACETAMINOPHEN 5-325 MG PO TABS
1.0000 | ORAL_TABLET | Freq: Four times a day (QID) | ORAL | Status: DC | PRN
  Administered 2020-06-29 – 2020-06-30 (×2): 1 via ORAL
  Filled 2020-06-29 (×2): qty 1

## 2020-06-29 MED ORDER — EPHEDRINE SULFATE-NACL 50-0.9 MG/10ML-% IV SOSY
PREFILLED_SYRINGE | INTRAVENOUS | Status: DC | PRN
  Administered 2020-06-29: 5 mg via INTRAVENOUS

## 2020-06-29 MED ORDER — FENTANYL CITRATE (PF) 250 MCG/5ML IJ SOLN
INTRAMUSCULAR | Status: AC
  Filled 2020-06-29: qty 5

## 2020-06-29 MED ORDER — LIDOCAINE 2% (20 MG/ML) 5 ML SYRINGE
INTRAMUSCULAR | Status: AC
  Filled 2020-06-29: qty 5

## 2020-06-29 MED ORDER — PROPOFOL 10 MG/ML IV BOLUS
INTRAVENOUS | Status: AC
  Filled 2020-06-29: qty 20

## 2020-06-29 MED ORDER — PAPAVERINE HCL 30 MG/ML IJ SOLN
INTRAMUSCULAR | Status: AC
  Filled 2020-06-29: qty 2

## 2020-06-29 MED ORDER — NEPRO/CARBSTEADY PO LIQD: 237.0000 mL | Freq: Two times a day (BID) | ORAL | Status: DC

## 2020-06-29 MED ORDER — FENTANYL CITRATE (PF) 250 MCG/5ML IJ SOLN
INTRAMUSCULAR | Status: DC | PRN
Start: 2020-06-29 — End: 2020-06-29
  Administered 2020-06-29: 50 ug via INTRAVENOUS
  Administered 2020-06-29 (×2): 25 ug via INTRAVENOUS
  Administered 2020-06-29: 100 ug via INTRAVENOUS

## 2020-06-29 MED ORDER — ACETAMINOPHEN 160 MG/5ML PO SOLN: 325.0000 mg | Freq: Once | ORAL | Status: DC | PRN

## 2020-06-29 MED ORDER — 0.9 % SODIUM CHLORIDE (POUR BTL) OPTIME
TOPICAL | Status: DC | PRN
  Administered 2020-06-29: 1000 mL

## 2020-06-29 MED ORDER — ROCURONIUM BROMIDE 10 MG/ML (PF) SYRINGE
PREFILLED_SYRINGE | INTRAVENOUS | Status: AC
  Filled 2020-06-29: qty 10

## 2020-06-29 MED ORDER — SODIUM CHLORIDE 0.9 % IV SOLN: INTRAVENOUS | Status: DC

## 2020-06-29 MED ORDER — ACETAMINOPHEN 325 MG PO TABS: 325.0000 mg | ORAL_TABLET | Freq: Once | ORAL | Status: DC | PRN

## 2020-06-29 MED ORDER — LACTATED RINGERS IV SOLN: INTRAVENOUS | Status: DC

## 2020-06-29 MED ORDER — LIDOCAINE-EPINEPHRINE (PF) 1 %-1:200000 IJ SOLN
INTRAMUSCULAR | Status: AC
  Filled 2020-06-29: qty 30

## 2020-06-29 MED ORDER — SODIUM CHLORIDE 0.9 % IV SOLN
INTRAVENOUS | Status: AC
  Filled 2020-06-29: qty 1.2

## 2020-06-29 MED ORDER — MEPERIDINE HCL 25 MG/ML IJ SOLN: 6.2500 mg | INTRAMUSCULAR | Status: DC | PRN

## 2020-06-29 MED ORDER — CHLORHEXIDINE GLUCONATE 0.12 % MT SOLN: 15.0000 mL | Freq: Once | OROMUCOSAL | Status: AC

## 2020-06-29 MED ORDER — FENTANYL CITRATE (PF) 100 MCG/2ML IJ SOLN: 25.0000 ug | INTRAMUSCULAR | Status: DC | PRN

## 2020-06-29 MED ORDER — PROPOFOL 10 MG/ML IV BOLUS
INTRAVENOUS | Status: DC | PRN
  Administered 2020-06-29: 20 mg via INTRAVENOUS

## 2020-06-29 MED ORDER — ACETAMINOPHEN 10 MG/ML IV SOLN: 1000.0000 mg | Freq: Once | INTRAVENOUS | Status: DC | PRN

## 2020-06-29 MED ORDER — EPHEDRINE 5 MG/ML INJ
INTRAVENOUS | Status: AC
  Filled 2020-06-29: qty 10

## 2020-06-29 MED ORDER — MIDAZOLAM HCL 2 MG/2ML IJ SOLN
INTRAMUSCULAR | Status: AC
  Filled 2020-06-29: qty 2

## 2020-06-29 MED ORDER — PHENYLEPHRINE 40 MCG/ML (10ML) SYRINGE FOR IV PUSH (FOR BLOOD PRESSURE SUPPORT)
PREFILLED_SYRINGE | INTRAVENOUS | Status: AC
  Filled 2020-06-29: qty 10

## 2020-06-29 MED ORDER — PHENYLEPHRINE HCL-NACL 10-0.9 MG/250ML-% IV SOLN
INTRAVENOUS | Status: DC | PRN
  Administered 2020-06-29: 50 ug/min via INTRAVENOUS

## 2020-06-29 MED ORDER — LIDOCAINE 2% (20 MG/ML) 5 ML SYRINGE
INTRAMUSCULAR | Status: DC | PRN
  Administered 2020-06-29: 100 mg via INTRAVENOUS

## 2020-06-29 MED ORDER — SODIUM CHLORIDE 0.9 % IV SOLN
INTRAVENOUS | Status: DC | PRN
  Administered 2020-06-29: 500 mL

## 2020-06-29 SURGICAL SUPPLY — 35 items
ARMBAND PINK RESTRICT EXTREMIT (MISCELLANEOUS) ×2 IMPLANT
CANISTER SUCT 3000ML PPV (MISCELLANEOUS) ×2 IMPLANT
CLIP VESOCCLUDE MED 6/CT (CLIP) ×2 IMPLANT
CLIP VESOCCLUDE SM WIDE 6/CT (CLIP) ×2 IMPLANT
COVER PROBE W GEL 5X96 (DRAPES) IMPLANT
COVER WAND RF STERILE (DRAPES) ×2 IMPLANT
DERMABOND ADVANCED (GAUZE/BANDAGES/DRESSINGS) ×1
DERMABOND ADVANCED .7 DNX12 (GAUZE/BANDAGES/DRESSINGS) ×1 IMPLANT
ELECT REM PT RETURN 9FT ADLT (ELECTROSURGICAL) ×2
ELECTRODE REM PT RTRN 9FT ADLT (ELECTROSURGICAL) ×1 IMPLANT
GLOVE BIO SURGEON STRL SZ 6.5 (GLOVE) ×2 IMPLANT
GLOVE BIO SURGEON STRL SZ7.5 (GLOVE) ×4 IMPLANT
GLOVE BIOGEL PI IND STRL 6.5 (GLOVE) ×1 IMPLANT
GLOVE BIOGEL PI IND STRL 7.0 (GLOVE) ×1 IMPLANT
GLOVE BIOGEL PI INDICATOR 6.5 (GLOVE) ×1
GLOVE BIOGEL PI INDICATOR 7.0 (GLOVE) ×1
GLOVE ECLIPSE 7.0 STRL STRAW (GLOVE) ×4 IMPLANT
GOWN STRL REUS W/ TWL LRG LVL3 (GOWN DISPOSABLE) ×3 IMPLANT
GOWN STRL REUS W/ TWL XL LVL3 (GOWN DISPOSABLE) ×1 IMPLANT
GOWN STRL REUS W/TWL LRG LVL3 (GOWN DISPOSABLE) ×6
GOWN STRL REUS W/TWL XL LVL3 (GOWN DISPOSABLE) ×2
INSERT FOGARTY SM (MISCELLANEOUS) IMPLANT
KIT BASIN OR (CUSTOM PROCEDURE TRAY) ×2 IMPLANT
KIT TURNOVER KIT B (KITS) ×2 IMPLANT
NS IRRIG 1000ML POUR BTL (IV SOLUTION) ×2 IMPLANT
PACK CV ACCESS (CUSTOM PROCEDURE TRAY) ×2 IMPLANT
PAD ARMBOARD 7.5X6 YLW CONV (MISCELLANEOUS) ×4 IMPLANT
SUT MNCRL AB 4-0 PS2 18 (SUTURE) ×2 IMPLANT
SUT PROLENE 6 0 BV (SUTURE) ×2 IMPLANT
SUT VIC AB 3-0 SH 27 (SUTURE) ×2
SUT VIC AB 3-0 SH 27X BRD (SUTURE) ×1 IMPLANT
SYR BULB IRRIG 60ML STRL (SYRINGE) ×2 IMPLANT
TOWEL GREEN STERILE (TOWEL DISPOSABLE) ×2 IMPLANT
UNDERPAD 30X36 HEAVY ABSORB (UNDERPADS AND DIAPERS) ×2 IMPLANT
WATER STERILE IRR 1000ML POUR (IV SOLUTION) ×2 IMPLANT

## 2020-06-29 NOTE — Progress Notes (Signed)
  Progress Note    06/29/2020 9:59 AM Day of Surgery  Subjective:  No overnight issues  Vitals:   06/29/20 0638 06/29/20 0813  BP: (!) 116/59 (!) 115/52  Pulse: 62 (!) 57  Resp: 17 16  Temp: (!) 97.5 F (36.4 C) 97.8 F (36.6 C)  SpO2: 99% 98%    Physical Exam: aaox3 Non labored respirations Palpable left radial pulse  CBC    Component Value Date/Time   WBC 10.5 06/29/2020 0354   RBC 2.98 (L) 06/29/2020 0354   HGB 8.7 (L) 06/29/2020 0354   HCT 27.4 (L) 06/29/2020 0354   PLT 185 06/29/2020 0354   MCV 91.9 06/29/2020 0354   MCH 29.2 06/29/2020 0354   MCHC 31.8 06/29/2020 0354   RDW 16.2 (H) 06/29/2020 0354   LYMPHSABS 1.8 06/26/2020 0228   MONOABS 1.6 (H) 06/26/2020 0228   EOSABS 0.1 06/26/2020 0228   BASOSABS 0.0 06/26/2020 0228    BMET    Component Value Date/Time   NA 136 06/29/2020 0354   K 3.7 06/29/2020 0354   CL 96 (L) 06/29/2020 0354   CO2 25 06/29/2020 0354   GLUCOSE 220 (H) 06/29/2020 0354   BUN 28 (H) 06/29/2020 0354   CREATININE 3.98 (H) 06/29/2020 0354   CALCIUM 8.6 (L) 06/29/2020 0354   CALCIUM 8.7 10/27/2013 1103   GFRNONAA 11 (L) 06/29/2020 0354   GFRAA 9 (L) 06/21/2020 1043    INR    Component Value Date/Time   INR 1.0 11/26/2019 0700     Intake/Output Summary (Last 24 hours) at 06/29/2020 0959 Last data filed at 06/29/2020 0658 Gross per 24 hour  Intake 360 ml  Output 1800 ml  Net -1440 ml    Assessment/plan:  64 y.o. female is now end-stage renal disease.  She has a tunneled dialysis catheter.  We will plan for left arm fistula or possibly graft. Discussed risks and benefits.    Guage Efferson C. Donzetta Matters, MD [facility] [place] [facility] Office: [phone] Pager: [phone]  06/29/2020 9:59 AM

## 2020-06-29 NOTE — Progress Notes (Signed)
Renal Navigator spoke with Attending and Nephrologist regarding discharge plan for patient. If cleared for discharge today or tomorrow, patient will have her next dialysis treatment in the outpatient clinic/[facility] on Thursday, 07/01/20. Therefore, in order to get her on schedule, patient with have HD this week on Monday, Thursday, and Saturday. She is scheduled for TTS at [facility] at discharge.  Navigator will follow up with patient to confirm plan (seat information given to her last week) once she is out of surgery. Navigator will confirm transportation plan with her as well, as patient stated plans to drive herself to/from outpatient HD and recommendation was given by Navigator to speak with family to take her to her first few appointments after discharge. She had stated that she has an aunt, son and daughter-in-law.  Alphonzo Cruise, [place] Renal Navigator [phone]

## 2020-06-29 NOTE — Progress Notes (Signed)
[facility] Progress Note  Name: Donna Howe MRN: [id number] DOB: 1956/07/31  Subjective:  S/p L BC AVF this AM CLIP to [facility], THS completed   Intake/Output Summary (Last 24 hours) at 06/29/2020 1351 Last data filed at 06/29/2020 1136 Gross per 24 hour  Intake 860 ml  Output 210 ml  Net 650 ml    Vitals:  Vitals:   06/29/20 0813 06/29/20 1140 06/29/20 1155 06/29/20 1213  BP: (!) 115/52 (!) 120/44 (!) 101/53 (!) 110/46  Pulse: (!) 57 70 67 72  Resp: 16 14 12    Temp: 97.8 F (36.6 C) 98 F (36.7 C) 98 F (36.7 C) 97.6 F (36.4 C)  TempSrc: Oral   Oral  SpO2: 98% 100% 98% 100%  Weight:      Height:         Physical Exam:  General: adult female in bed in NAD  HEENT: NCAT Eyes: EOMI sclera anicteric Neck: supple trachea midline Heart: S1S2 no rub Lungs: clear with normal WOB Abdomen: softly dist/obese habitus/nontender Extremities: trace LE Edema Skin: no rash on extremities exposed Psych normal mood and affect Neuro: conversant L BC AVF +T  Medications reviewed   Labs:  BMP Latest Ref Rng & Units 06/29/2020 06/28/2020 06/27/2020  Glucose 70 - 99 mg/dL 220(H) 391(H) 424(H)  BUN 8 - 23 mg/dL 28(H) 58(H) 52(H)  Creatinine 0.44 - 1.00 mg/dL 3.98(H) 6.11(H) 5.91(H)  Sodium 135 - 145 mmol/L 136 132(L) 130(L)  Potassium 3.5 - 5.1 mmol/L 3.7 5.0 5.3(H)  Chloride 98 - 111 mmol/L 96(L) 94(L) 95(L)  CO2 22 - 32 mmol/L 25 23 23   Calcium 8.9 - 10.3 mg/dL 8.6(L) 8.9 8.7(L)     Assessment/Plan:   # NSTEMI / CAD/ angina - s/p LHC 10/5 with diffuse disease --> no CABG targets, cardiology discussed and no PCI options - noted cardiology has stopped beta blocker due to bradycardia -Medically managing CAD - add imdur if BP will tolerate - won't currently  # CKD stage V with progression to end-stage renal disease -s/p TDC 10/4, 1st HD 10/4 and ongoing since then, last 10/8.  HD 10/11 given AVF surgery 10/12, then next planned HD Thurs as outpt at [facility].   Has TDC and s/p L BC AVF 10/11 VVS.    # Hypokalemia:  Supplemented to normal - on KCl po and 4K dialysate  # Acute on chronic diastolic CHF  - cont po diuretics and on discharge. She will tolerate her HD better if her need for UF is limited.  Her UOP is quite good.  - UF with HD as tolerated  # HTN  - BPs have been reasonable bordering on low  # Anemia of CKD  - started ESA - Transfuse as needed  - appears iron is ok (ferritin low but iron level and tsat ok) --> D/c po iron as she will be followed closely for need for IV iron.  #gout - feels flare in R foot; usually takes colchcine - I'd rather she not with ESRD.  UA 6.5.  Trend for now. Consider allopurinol if continues to flair.  Rexene Agent, MD 06/29/2020 1:51 PM

## 2020-06-29 NOTE — Anesthesia Preprocedure Evaluation (Addendum)
Anesthesia Evaluation  Patient identified by MRN, date of birth, ID band Patient awake    Reviewed: Allergy & Precautions, NPO status , Patient's Chart, lab work & pertinent test results, reviewed documented beta blocker date and time   Airway Mallampati: I  TM Distance: >3 FB Neck ROM: Full    Dental  (+) Teeth Intact, Poor Dentition, Chipped,    Pulmonary asthma , former smoker,    Pulmonary exam normal        Cardiovascular hypertension, Pt. on home beta blockers + CAD, + Cardiac Stents, + CABG and + Peripheral Vascular Disease   Rhythm:Regular Rate:Normal     Neuro/Psych negative neurological ROS  negative psych ROS   GI/Hepatic Neg liver ROS, GERD  Medicated,  Endo/Other  diabetes, Type 2Hypothyroidism   Renal/GU CRFRenal disease     Musculoskeletal   Abdominal (+) + obese,   Peds  Hematology   Anesthesia Other Findings   Reproductive/Obstetrics                            Anesthesia Physical Anesthesia Plan  ASA: III  Anesthesia Plan: MAC   Post-op Pain Management:    Induction: Intravenous  PONV Risk Score and Plan: 3 and Ondansetron, Propofol infusion and Midazolam  Airway Management Planned: Natural Airway and Simple Face Mask  Additional Equipment: None  Intra-op Plan:   Post-operative Plan: Extubation in OR  Informed Consent: I have reviewed the patients History and Physical, chart, labs and discussed the procedure including the risks, benefits and alternatives for the proposed anesthesia with the patient or authorized representative who has indicated his/her understanding and acceptance.     Dental advisory given  Plan Discussed with: CRNA  Anesthesia Plan Comments: (Echo:  1. Left ventricular ejection fraction, by estimation, is 55 to 60%. The  left ventricle has normal function. The left ventricle has no regional  wall motion abnormalities.  2. The  mitral valve is normal in structure. Mild to moderate mitral valve  regurgitation. No evidence of mitral stenosis.  3. Limited echocardiogram to reevalaute LV function and wall motion. )       Anesthesia Quick Evaluation

## 2020-06-29 NOTE — Discharge Instructions (Signed)
   [facility]  Discharge Instructions  AV Fistula or Graft Surgery for Dialysis Access  Please refer to the following instructions for your post-procedure care. Your surgeon or physician assistant will discuss any changes with you.  Activity  You may drive the day following your surgery, if you are comfortable and no longer taking prescription pain medication. Resume full activity as the soreness in your incision resolves.  Bathing/Showering  You may shower after you go home. Keep your incision dry for 48 hours. Do not soak in a bathtub, hot tub, or swim until the incision heals completely. You may not shower if you have a hemodialysis catheter.  Incision Care  Clean your incision with mild soap and water after 48 hours. Pat the area dry with a clean towel. You do not need a bandage unless otherwise instructed. Do not apply any ointments or creams to your incision. You may have skin glue on your incision. Do not peel it off. It will come off on its own in about one week. Your arm may swell a bit after surgery. To reduce swelling use pillows to elevate your arm so it is above your heart. Your doctor will tell you if you need to lightly wrap your arm with an ACE bandage.  Diet  Resume your normal diet. There are not special food restrictions following this procedure. In order to heal from your surgery, it is CRITICAL to get adequate nutrition. Your body requires vitamins, minerals, and protein. Vegetables are the best source of vitamins and minerals. Vegetables also provide the perfect balance of protein. Processed food has little nutritional value, so try to avoid this.  Medications  Resume taking all of your medications. If your incision is causing pain, you may take over-the counter pain relievers such as acetaminophen (Tylenol). If you were prescribed a stronger pain medication, please be aware these medications can cause nausea and constipation. Prevent  nausea by taking the medication with a snack or meal. Avoid constipation by drinking plenty of fluids and eating foods with high amount of fiber, such as fruits, vegetables, and grains.  Do not take Tylenol if you are taking prescription pain medications.  Follow up Your surgeon may want to see you in the office following your access surgery. If so, this will be arranged at the time of your surgery.  Please call us immediately for any of the following conditions:  . Increased pain, redness, drainage (pus) from your incision site . Fever of 101 degrees or higher . Severe or worsening pain at your incision site . Hand pain or numbness. .  Reduce your risk of vascular disease:  . Stop smoking. If you would like help, call [facility] at 1-800-QUIT-NOW 272-323-8732) or [place] at 951-634-4888  . Manage your cholesterol . Maintain a desired weight . Control your diabetes . Keep your blood pressure down  Dialysis  It will take several weeks to several months for your new dialysis access to be ready for use. Your surgeon will determine when it is okay to use it. Your nephrologist will continue to direct your dialysis. You can continue to use your Permcath until your new access is ready for use.   06/29/2020 Donna Howe [id number] 13-Aug-1956  Surgeon(s): Waynetta Sandy, MD  Procedure(s): Creation of left brachiocephalic AV fistula  x Do not stick fistula for 12 weeks    If you have any questions, please call the office at [phone].

## 2020-06-29 NOTE — Anesthesia Postprocedure Evaluation (Signed)
Anesthesia Post Note  Patient: Donna Howe  Procedure(s) Performed: LEFT ARM ARTERIOVENOUS (AV) FISTULA (Left )     Patient location during evaluation: PACU Anesthesia Type: MAC Level of consciousness: awake and alert Pain management: pain level controlled Vital Signs Assessment: post-procedure vital signs reviewed and stable Respiratory status: spontaneous breathing, nonlabored ventilation, respiratory function stable and patient connected to nasal cannula oxygen Cardiovascular status: stable and blood pressure returned to baseline Postop Assessment: no apparent nausea or vomiting Anesthetic complications: no   No complications documented.  Last Vitals:  Vitals:   06/29/20 1155 06/29/20 1213  BP: (!) 101/53 (!) 110/46  Pulse: 67 72  Resp: 12   Temp: 36.7 C 36.4 C  SpO2: 98% 100%    Last Pain:  Vitals:   06/29/20 1213  TempSrc: Oral  PainSc:                  Effie Berkshire

## 2020-06-29 NOTE — Plan of Care (Signed)

## 2020-06-29 NOTE — Op Note (Signed)
    Patient name: Donna Howe MRN: [id number] DOB: 07-09-1956 Sex: female  06/29/2020 Pre-operative Diagnosis: End-stage renal disease Post-operative diagnosis:  Same Surgeon:  Erlene Quan C. Donzetta Matters, MD Assistant: Leontine Locket, PA Procedure Performed:  Left brachial artery to cephalic vein AV fistula creation  Indications: 64 year old female with new onset end-stage renal disease.  She has a catheter in place.  She is indicated for permanent access.  She is right-hand dominant.  An assistant was necessary for suction, retraction, anastomosis and wound closure.  Findings: There was a large cephalic vein at the antecubitum identified with ultrasound.  Grossly this was similarly least 4 mm diameter free of disease.  Brachial artery measured 3 mm external diameter similarly free of disease.  At completion there was a strong thrill in the fistula and a radial artery pulse at the wrist confirmed with Doppler.   Procedure:  The patient was identified in the holding area and taken to the operating room where she was placed upon the operative table MAC anesthesia was induced.  She was gently prepped and draped in the left upper extremity usual fashion antibiotics were minister timeout was called.  We used ultrasound to identify the cephalic vein in the upper arm.  The area was anesthetized just below the antecubitum.  Transverse incision was made.  Dissected out the vein divided branches between ties.  We dissected through the deep fascia placed a vessel loop around the brachial artery.  The vein was transected distally and tied off spatulated flushed heparinized saline more for orientation and clamped.  The brachial artery was clamped distally proximally opened longitudinally flushed with heparinized saline both directions.  The vein was sewn end-to-side to the arteriotomy with 6-0 Prolene suture.  Prior to completion without flushing all directions.  Upon completion there was a strong thrill in the fistula  confirmed with Doppler and a palpable radial artery pulse also confirmed with Doppler.  But the signal and the pulse did augment with compression of the fistula.  Satisfied we irrigated and obtained hemostasis.  Wound was closed with Vicryl and Monocryl.  Dermabond is placed at the level of the skin.  EBL: 20cc  Nicholous Girgenti C. Donzetta Matters, MD [facility] [place] Office: [phone] Pager: [phone]

## 2020-06-29 NOTE — Progress Notes (Signed)
PROGRESS NOTE    Donna Howe  ATF:[id number] DOB: 06/23/1956 DOA: 06/18/2020 PCP: [facility]    Brief Narrative:  Patient admitted to the hospital with a working diagnosis of acute kidney injury on chronic kidney disease, complicated by hyperkalemia, uremia and bradycardia.  64 year old female with past medical history for chronic kidney disease stage V, type 2 diabetes mellitus, dyslipidemia, and hypertension. Recent hospitalization at [facility] for non-STEMI. Patient was readmitted today after discharge due to bradycardia. At home patient had generalized weakness, fatigue, confusion and slurred speech. On her initial physical examination her heart rate was 45 bpm, blood pressure 142/32, respiratory rate 18, oxygen saturation 90%. Her lungs were clear to auscultation bilaterally, heart S1-S2, present, bradycardic, soft abdomen, positive bilateral lower extremity edema more right than left. Sodium 132, potassium 6.7, chloride 101, bicarb 20, glucose 306,BUN 106, creatinine 5.18, troponin I 482, BNP 2,129, white count 11.5, hemoglobin 9.2, hematocrit 27.5, platelets 88. SARS COVID-19 negative. EKG 45 bpm, rightward axis, right bundle branch block, junctional rhythm, no significant ST segment changes, negative T wave V1 through V3.  Patient received medical therapy for hyperkalemia including Kayexalate, calcium gluconate, and insulin.  On 10/4 patient underwent placement of tunneled HD catheter in the right internal jugular and posteriorly underwent hemodialysis.   On 10/5 patient underwent coronary angiography showing diffuse and severe coronary artery disease, including native and graft vessels. Positive in-stent restenosis LIMA to LAD. Patient was referred for reevaluation for bypass grafting. She had her first CABG approximately 16 years ago.  Final recommendations from cardiology to continue with aggressive medical management.   Patient will  get permament access on 10/12 and plan for dc home on 10/13   Assessment & Plan:   Principal Problem:   End stage renal disease ([place]) Active Problems:   Class 2 obesity   Bradycardia   Diabetes mellitus with renal manifestation (HCC)   Atherosclerosis of native artery of extremity with intermittent claudication (HCC)   CAD (coronary artery disease)   Carotid artery disease (HCC)   Anemia   Chronic diastolic HF (heart failure) (HCC)   Hyperkalemia   1. Hyperkalemia due to progression of CKD to end stage.  Now sp fistula creation on 06/29/20 with no major complications. Clinically volume status is stable.   Next HD will be in am as outpatient.   2. Severe coronary artery disease with recent NSTEMI/ acute on chronic diastolic heart failure. Echocardiogram with preserved LV systolic function 55 to 27%. Holdingb blockade or RAAS inhibition due to risk of hypotension. Patient with no further angina.  Medical therapy with aspirin, clopidogrel andhigh dose high potencystatin withatorvastatin(80 mg).  3. HTN.blood pressure systolic stable at 062 to 116 mmHg,  continue to hold on antihypertensive medications.   4. Unontrolled T2DM/ steroid induced hyperglycemia.sp 60 mg of prednisone.   Fasting glucose 220, capillary 187, 180, and 175, tolerating well insulin therapy with basal 15 units, sliding scale moderate sensitivity, and pre-meal insulun 3 units for now.   5. Anemia of chronic renal disease/ iron deficiency.Iron 146, TIBC 239, transferrin saturation 61.Sp one unit prbc transfusion. Keep Hgb above 8 due to CAD.   On EPO, to continue as outpatient per nephrology.   6. Anxiety.Continue with alprazolamPRNand qhs.   7. Hypothyroid.Continue withlevothyroxine.  8. Acute right foot gout. Uric acid level at 6,5,  Plan to continue allopurinol adjusted for HD, 100 mg post HD. Continue with topical diclofenac.    Status is: Inpatient  Remains inpatient  appropriate  because:Inpatient level of care appropriate due to severity of illness   Dispo: The patient is from: Home              Anticipated d/c is to: Home              Anticipated d/c date is: 1 day              Patient currently is not medically stable to d/c.   DVT prophylaxis: Heparin   Code Status:   full  Family Communication:  No family at the bedside      Nutrition Status: Nutrition Problem: Inadequate oral intake Etiology: nausea, decreased appetite Signs/Symptoms: per patient/family report Interventions: Ensure Enlive (each supplement provides 350kcal and 20 grams of protein), MVI, Education     Consultants:   Nephrology   Vascular surgery   CT surgery   Cardiology     Subjective: Patient is awake and alert, no nausea or vomiting, no chest pain or dyspnea, mild pain at the left upper extremity, post op.   Objective: Vitals:   06/29/20 0813 06/29/20 1140 06/29/20 1155 06/29/20 1213  BP: (!) 115/52 (!) 120/44 (!) 101/53 (!) 110/46  Pulse: (!) 57 70 67 72  Resp: 16 14 12    Temp: 97.8 F (36.6 C) 98 F (36.7 C) 98 F (36.7 C) 97.6 F (36.4 C)  TempSrc: Oral   Oral  SpO2: 98% 100% 98% 100%  Weight:      Height:        Intake/Output Summary (Last 24 hours) at 06/29/2020 1300 Last data filed at 06/29/2020 1136 Gross per 24 hour  Intake 860 ml  Output 210 ml  Net 650 ml   Filed Weights   06/28/20 0741 06/28/20 1157 06/29/20 0638  Weight: 96.7 kg 95 kg 93.1 kg    Examination:   General: Not in pain or dyspnea, deconditioned  Neurology: Awake and alert, non focal  E ENT: mild pallor, no icterus, oral mucosa moist Cardiovascular: No JVD. S1-S2 present, rhythmic, no gallops, rubs, or murmurs. Trace lower extremity edema. Pulmonary: positive breath sounds bilaterally, adequate air movement, no wheezing, rhonchi or rales. Gastrointestinal. Abdomen soft and non tender Skin. No rashes Musculoskeletal: no joint deformities     Data  Reviewed: I have personally reviewed following labs and imaging studies  CBC: Recent Labs  Lab 06/24/20 0359 06/25/20 0135 06/26/20 0228 06/28/20 0839 06/29/20 0354  WBC 9.6 9.9 9.7 11.7* 10.5  NEUTROABS  --  6.3 6.1  --   --   HGB 7.5* 7.4* 8.9* 9.1* 8.7*  HCT 23.4* 23.1* 27.9* 27.9* 27.4*  MCV 93.2 93.5 92.4 90.0 91.9  PLT 122* 127* 149* 196 409   Basic Metabolic Panel: Recent Labs  Lab 06/26/20 0228 06/27/20 0220 06/27/20 2213 06/28/20 0358 06/29/20 0354  NA 140 136 130* 132* 136  K 4.5 4.5 5.3* 5.0 3.7  CL 102 99 95* 94* 96*  CO2 27 26 23 23 25   GLUCOSE 197* 233* 424* 391* 220*  BUN 19 36* 52* 58* 28*  CREATININE 3.41* 5.09* 5.91* 6.11* 3.98*  CALCIUM 8.7* 8.7* 8.7* 8.9 8.6*   GFR: Estimated Creatinine Clearance: 17.1 mL/min (A) (by C-G formula based on SCr of 3.98 mg/dL (H)). Liver Function Tests: No results for input(s): AST, ALT, ALKPHOS, BILITOT, PROT, ALBUMIN in the last 168 hours. No results for input(s): LIPASE, AMYLASE in the last 168 hours. No results for input(s): AMMONIA in the last 168 hours. Coagulation Profile: No results for input(s):  INR, PROTIME in the last 168 hours. Cardiac Enzymes: No results for input(s): CKTOTAL, CKMB, CKMBINDEX, TROPONINI in the last 168 hours. BNP (last 3 results) No results for input(s): PROBNP in the last 8760 hours. HbA1C: No results for input(s): HGBA1C in the last 72 hours. CBG: Recent Labs  Lab 06/28/20 1633 06/28/20 2200 06/29/20 0826 06/29/20 1142 06/29/20 1257  GLUCAP 238* 221* 187* 180* 175*   Lipid Profile: No results for input(s): CHOL, HDL, LDLCALC, TRIG, CHOLHDL, LDLDIRECT in the last 72 hours. Thyroid Function Tests: No results for input(s): TSH, T4TOTAL, FREET4, T3FREE, THYROIDAB in the last 72 hours. Anemia Panel: No results for input(s): VITAMINB12, FOLATE, FERRITIN, TIBC, IRON, RETICCTPCT in the last 72 hours.    Radiology Studies: I have reviewed all of the imaging during this hospital  visit personally     Scheduled Meds: . ALPRAZolam  0.25 mg Oral BID  . ALPRAZolam  0.5 mg Oral QHS  . aspirin EC  81 mg Oral Daily  . atorvastatin  80 mg Oral QPM  . Chlorhexidine Gluconate Cloth  6 each Topical Q0600  . Chlorhexidine Gluconate Cloth  6 each Topical Q0600  . clopidogrel  75 mg Oral Daily  . darbepoetin (ARANESP) injection - DIALYSIS  100 mcg Intravenous Q Fri-HD  . diclofenac Sodium  2 g Topical QID  . feeding supplement (ENSURE ENLIVE)  237 mL Oral BID BM  . heparin  5,000 Units Subcutaneous Q8H  . insulin aspart  0-15 Units Subcutaneous TID WC  . insulin aspart  0-5 Units Subcutaneous QHS  . insulin aspart  3 Units Subcutaneous TID WC  . insulin glargine  15 Units Subcutaneous Daily  . levothyroxine  125 mcg Oral Daily  . multivitamin  1 tablet Oral QHS  . pantoprazole  40 mg Oral BID  . sodium chloride flush  3 mL Intravenous Q12H  . sodium chloride flush  3 mL Intravenous Q12H  . Vitamin D (Ergocalciferol)  50,000 Units Oral Q Mon   Continuous Infusions: . sodium chloride    . nitroGLYCERIN Stopped (06/23/20 0207)     LOS: 11 days        Donna Trower Gerome Apley, MD

## 2020-06-29 NOTE — Transfer of Care (Signed)
Immediate Anesthesia Transfer of Care Note  Patient: Annora Guderian [place]  Procedure(s) Performed: LEFT ARM ARTERIOVENOUS (AV) FISTULA (Left )  Patient Location: PACU  Anesthesia Type:General  Level of Consciousness: drowsy and patient cooperative  Airway & Oxygen Therapy: Patient Spontanous Breathing  Post-op Assessment: Report given to RN and Post -op Vital signs reviewed and stable  Post vital signs: Reviewed and stable  Last Vitals:  Vitals Value Taken Time  BP 120/44 06/29/20 1142  Temp 36.7 C 06/29/20 1140  Pulse 73 06/29/20 1143  Resp 16 06/29/20 1143  SpO2 100 % 06/29/20 1143  Vitals shown include unvalidated device data.  Last Pain:  Vitals:   06/29/20 1140  TempSrc:   PainSc: (P) 0-No pain      Patients Stated Pain Goal: 0 (93/23/55 7322)  Complications: No complications documented.

## 2020-06-29 NOTE — Progress Notes (Signed)
Initial Nutrition Assessment  DOCUMENTATION CODES:   Obesity unspecified  INTERVENTION:   Continue Renal MVI daily.  Follow-up and provide diet education when appropriate and patient is available.  D/C Ensure Enlive po BID, patient is not accepting.  Try Nepro Shake po BID, each supplement provides 425 kcal and 19 grams protein  NUTRITION DIAGNOSIS:   Inadequate oral intake related to nausea, decreased appetite as evidenced by per patient/family report.  Ongoing  GOAL:   Patient will meet greater than or equal to 90% of their needs   Unmet  MONITOR:   PO intake, Supplement acceptance, Labs, Weight trends  REASON FOR ASSESSMENT:   Consult Other (Comment), Assessment of nutrition requirement/status (new start HD)  ASSESSMENT:   64 yo female admitted with NSTEMI,  AKI on CKD with uremic symptoms and progression to ESRD and initiation of HD this admission. PMH includes CHF, CKD, DM, HTN, HLD, CAD/CABG  First HD session was 10/4. Last HD was 10/11, 1600 ml UF Patient out of room for AV fistula creation for permanent HD access at time of visit today.  Recorded po intake 25-50% of meals; 34% on average She is being offered Ensure Enlive PO BID; she is refusing the supplement at times and out of her room during scheduled administration times, sometimes. For the past 3 days she has not had an Ensure supplement and prior to that she was drinking 1 supplement per day max.  Post HD weight of 95 kg 10/11, 93.078 kg today; admit weight 106.5 kg.  Net negaitve 15.5 L per flow sheet  Labs reviewed. Potassium 3.7 WNL, Last Phos 5.7 (H) on 10/3, BUN 28, Creat 3.98 CBG: [id number]  Medications reviewed and include Aranesp, Novolog, Lantus, Rena-vit; KCl, ergocalciferol.  Diet Order:   Diet Order            Diet renal/carb modified with fluid restriction Diet-HS Snack? Nothing; Fluid restriction: 1200 mL Fluid; Room service appropriate? Yes; Fluid consistency: Thin  Diet  effective now                 EDUCATION NEEDS:   Education needs have been addressed  Skin:  Skin Assessment: Reviewed RN Assessment  Last BM:  10/9  Height:   Ht Readings from Last 1 Encounters:  06/19/20 5' 5.98" (1.676 m)    Weight:   Wt Readings from Last 1 Encounters:  06/29/20 93.1 kg    BMI:  Body mass index is 33.14 kg/m.  Estimated Nutritional Needs:   Kcal:  2000-2200 kcals  Protein:  100-115 g  Fluid:  1000 mL plus UOP   Lucas Mallow, RD, LDN, CNSC Please refer to Amion for contact information.

## 2020-06-29 NOTE — Progress Notes (Signed)
Physical Therapy Treatment Patient Details Name: Donna Howe MRN: [id number] DOB: 30-Nov-1955 Today's Date: 06/29/2020    History of Present Illness The pt is a 64 yo female presenting with weakness, fatigue, and AMS one day following d/c from recent admission for NSTEMI (d/c on 9/30). Upon this admission, pt found to have AKI complicated by hyperkalemia, uremia, and bradycardia. PMH includes: DM II, HLD, HTN, CKD V, CAD with NSTEMI and DES, R carotid stenosis, and PAD.     PT Comments    Pt had HD fistula placed in L UE today and anesthetic has started to wear off and pt experiencing 10/10 throbbing pain at the site. RN notified and giving pain medication at end of session. Pt reports wanting to work with therapy, and is able to come to side of bed with mod I, and transfer and ambulate without AD with min guard assist. Limited ambulation to room as pt has complaints of dizziness and fatigue. D/c plans remain appropriate however pt reports she can not handle the stress of scheduling HHPT with new HD schedule. PT to provide HEP and stair training prior to d/c.     Follow Up Recommendations  Home health PT;Supervision/Assistance - 24 hour     Equipment Recommendations   (tub bench)       Precautions / Restrictions Precautions Precautions: None Restrictions Weight Bearing Restrictions: No    Mobility  Bed Mobility Overal bed mobility: Modified Independent             General bed mobility comments: slight use of bed rails, could likely do without  Transfers Overall transfer level: Needs assistance Equipment used: None Transfers: Sit to/from Stand Sit to Stand: Min guard         General transfer comment: min guard for safety, pt with dizziness in standing  Ambulation/Gait Ambulation/Gait assistance: Min assist Gait Distance (Feet): 30 Feet   Gait Pattern/deviations: Trunk flexed;Step-through pattern;Decreased step length - right;Decreased step length -  left;Shuffle Gait velocity: decreased Gait velocity interpretation: <1.31 ft/sec, indicative of household ambulator General Gait Details: pt reports feeling dizzy from anesthetic, unable to use RW for support due to inability to use L UE, utilizes sink and bedrail for support           Balance Overall balance assessment: Mild deficits observed, not formally tested                                          Cognition Arousal/Alertness: Awake/alert Behavior During Therapy: WFL for tasks assessed/performed Overall Cognitive Status: Within Functional Limits for tasks assessed                                        Exercises Other Exercises Other Exercises: 5x sit<>stand    General Comments General comments (skin integrity, edema, etc.): VSS on RA, site of fisula placement slightly swollen       Pertinent Vitals/Pain Pain Assessment: 0-10 Pain Score: 10-Worst pain ever Pain Location: L UE at site of fistula placement Pain Descriptors / Indicators: Throbbing;Aching Pain Intervention(s): Limited activity within patient's tolerance;Monitored during session;Patient requesting pain meds-RN notified;Repositioned           PT Goals (current goals can now be found in the care plan section) Acute Rehab PT Goals Patient Stated Goal: return home  with family to assist PT Goal Formulation: With patient Time For Goal Achievement: 07/10/20 Potential to Achieve Goals: Good Progress towards PT goals: Progressing toward goals    Frequency    Min 3X/week      PT Plan Current plan remains appropriate       AM-PAC PT "6 Clicks" Mobility   Outcome Measure  Help needed turning from your back to your side while in a flat bed without using bedrails?: None Help needed moving from lying on your back to sitting on the side of a flat bed without using bedrails?: None Help needed moving to and from a bed to a chair (including a wheelchair)?: A Little Help  needed standing up from a chair using your arms (e.g., wheelchair or bedside chair)?: A Little Help needed to walk in hospital room?: A Little Help needed climbing 3-5 steps with a railing? : A Lot 6 Click Score: 19    End of Session   Activity Tolerance: Patient limited by fatigue;Other (comment) (dizziness) Patient left: in bed;with call bell/phone within reach;with nursing/sitter in room (sitting upright in bed) Nurse Communication: Mobility status PT Visit Diagnosis: Difficulty in walking, not elsewhere classified (R26.2)     Time: 9191-6606 PT Time Calculation (min) (ACUTE ONLY): 25 min  Charges:  $Therapeutic Exercise: 8-22 mins $Therapeutic Activity: 8-22 mins                     Skyy Mcknight B. Migdalia Dk PT, DPT Acute Rehabilitation Services Pager [phone] Office [phone]    [place] 06/29/2020, 4:58 PM

## 2020-06-30 ENCOUNTER — Other Ambulatory Visit: Payer: Self-pay | Admitting: *Deleted

## 2020-06-30 ENCOUNTER — Encounter (HOSPITAL_COMMUNITY): Payer: Self-pay | Admitting: Vascular Surgery

## 2020-06-30 DIAGNOSIS — T782XXA Anaphylactic shock, unspecified, initial encounter: Secondary | ICD-10-CM | POA: Insufficient documentation

## 2020-06-30 DIAGNOSIS — R197 Diarrhea, unspecified: Secondary | ICD-10-CM | POA: Insufficient documentation

## 2020-06-30 DIAGNOSIS — I739 Peripheral vascular disease, unspecified: Secondary | ICD-10-CM | POA: Insufficient documentation

## 2020-06-30 DIAGNOSIS — D631 Anemia in chronic kidney disease: Secondary | ICD-10-CM | POA: Insufficient documentation

## 2020-06-30 DIAGNOSIS — N189 Chronic kidney disease, unspecified: Secondary | ICD-10-CM | POA: Insufficient documentation

## 2020-06-30 DIAGNOSIS — D509 Iron deficiency anemia, unspecified: Secondary | ICD-10-CM | POA: Insufficient documentation

## 2020-06-30 DIAGNOSIS — I70219 Atherosclerosis of native arteries of extremities with intermittent claudication, unspecified extremity: Secondary | ICD-10-CM | POA: Diagnosis not present

## 2020-06-30 DIAGNOSIS — N1832 Chronic kidney disease, stage 3b: Secondary | ICD-10-CM

## 2020-06-30 DIAGNOSIS — Z992 Dependence on renal dialysis: Secondary | ICD-10-CM

## 2020-06-30 DIAGNOSIS — T7840XA Allergy, unspecified, initial encounter: Secondary | ICD-10-CM | POA: Insufficient documentation

## 2020-06-30 DIAGNOSIS — L299 Pruritus, unspecified: Secondary | ICD-10-CM | POA: Insufficient documentation

## 2020-06-30 DIAGNOSIS — R52 Pain, unspecified: Secondary | ICD-10-CM | POA: Insufficient documentation

## 2020-06-30 DIAGNOSIS — D689 Coagulation defect, unspecified: Secondary | ICD-10-CM | POA: Insufficient documentation

## 2020-06-30 DIAGNOSIS — N2581 Secondary hyperparathyroidism of renal origin: Secondary | ICD-10-CM | POA: Insufficient documentation

## 2020-06-30 DIAGNOSIS — I6523 Occlusion and stenosis of bilateral carotid arteries: Secondary | ICD-10-CM | POA: Diagnosis not present

## 2020-06-30 DIAGNOSIS — I25110 Atherosclerotic heart disease of native coronary artery with unstable angina pectoris: Secondary | ICD-10-CM | POA: Diagnosis not present

## 2020-06-30 DIAGNOSIS — Z23 Encounter for immunization: Secondary | ICD-10-CM | POA: Insufficient documentation

## 2020-06-30 DIAGNOSIS — N186 End stage renal disease: Secondary | ICD-10-CM | POA: Diagnosis not present

## 2020-06-30 LAB — GLUCOSE, CAPILLARY
Glucose-Capillary: 223 mg/dL — ABNORMAL HIGH (ref 70–99)
Glucose-Capillary: 122 mg/dL — ABNORMAL HIGH (ref 70–99)

## 2020-06-30 MED ORDER — DICLOFENAC SODIUM 1 % EX GEL: 2.0000 g | Freq: Four times a day (QID) | CUTANEOUS | 0 refills | Status: DC | PRN

## 2020-06-30 MED ORDER — RENA-VITE PO TABS: 1.0000 | ORAL_TABLET | Freq: Every day | ORAL | 0 refills | Status: AC

## 2020-06-30 MED ORDER — ALLOPURINOL 100 MG PO TABS: ORAL_TABLET | ORAL | 11 refills | Status: DC

## 2020-06-30 MED ORDER — ACETAMINOPHEN 325 MG PO TABS: 650.0000 mg | ORAL_TABLET | Freq: Four times a day (QID) | ORAL | Status: DC | PRN

## 2020-06-30 MED ORDER — OXYCODONE HCL 5 MG PO TABS
5.0000 mg | ORAL_TABLET | ORAL | 0 refills | Status: DC | PRN
Start: 2020-06-30 — End: 2020-09-10

## 2020-06-30 MED ORDER — NEPRO/CARBSTEADY PO LIQD: 237.0000 mL | Freq: Two times a day (BID) | ORAL | 0 refills | Status: AC

## 2020-06-30 NOTE — Plan of Care (Signed)

## 2020-06-30 NOTE — Plan of Care (Signed)
  Problem: Education: Goal: Knowledge of General Education information will improve Description: Including pain rating scale, medication(s)/side effects and non-pharmacologic comfort measures Outcome: Adequate for Discharge   Problem: Health Behavior/Discharge Planning: Goal: Ability to manage health-related needs will improve Outcome: Adequate for Discharge   Problem: Clinical Measurements: Goal: Ability to maintain clinical measurements within normal limits will improve Outcome: Adequate for Discharge Goal: Will remain free from infection Outcome: Adequate for Discharge Goal: Diagnostic test results will improve Outcome: Adequate for Discharge Goal: Respiratory complications will improve Outcome: Adequate for Discharge Goal: Cardiovascular complication will be avoided Outcome: Adequate for Discharge   Problem: Activity: Goal: Risk for activity intolerance will decrease Outcome: Adequate for Discharge   Problem: Nutrition: Goal: Adequate nutrition will be maintained Outcome: Adequate for Discharge   Problem: Coping: Goal: Level of anxiety will decrease Outcome: Adequate for Discharge   Problem: Elimination: Goal: Will not experience complications related to bowel motility Outcome: Adequate for Discharge Goal: Will not experience complications related to urinary retention Outcome: Adequate for Discharge   Problem: Pain Managment: Goal: General experience of comfort will improve Outcome: Adequate for Discharge   Problem: Safety: Goal: Ability to remain free from injury will improve Outcome: Adequate for Discharge   Problem: Skin Integrity: Goal: Risk for impaired skin integrity will decrease Outcome: Adequate for Discharge   Problem: Education: Goal: Knowledge of disease and its progression will improve Outcome: Adequate for Discharge   Problem: Fluid Volume: Goal: Compliance with measures to maintain balanced fluid volume will improve Outcome: Adequate for  Discharge   Problem: Clinical Measurements: Goal: Complications related to the disease process, condition or treatment will be avoided or minimized Outcome: Adequate for Discharge   Problem: Education: Goal: Understanding of cardiac disease, CV risk reduction, and recovery process will improve Outcome: Adequate for Discharge   Problem: Activity: Goal: Ability to tolerate increased activity will improve Outcome: Adequate for Discharge   Problem: Cardiac: Goal: Ability to achieve and maintain adequate cardiovascular perfusion will improve Outcome: Adequate for Discharge     Patient is refusing home health at this time.

## 2020-06-30 NOTE — Discharge Summary (Addendum)
Physician Discharge Summary  Donna Howe ZOX:[id number] DOB: 06-Dec-1955 DOA: 06/18/2020  PCP: [facility]  Admit date: 06/18/2020 Discharge date: 06/30/2020  Admitted From: Home  Disposition:  Home   Recommendations for Outpatient Follow-up and new medication changes:  1. Follow up with Primary care in 7 days.  2. Patient on hemodialysis on T-Th-Sat schedule.  3. Follow-up with vascular surgery in 5 to 6 weeks for duplex of AV fistula. 4. Added allopurinol to take 100 mg after hemodialysis.  5. Discontinue oral iron.  6. Holding metoprolol.  7. Placed on as needed oxycodone for post operative pain.   Home Health:  Yes  Equipment/Devices: shower chair/ tub bench    Discharge Condition: stable  CODE STATUS: full  Diet recommendation: heart healthy and renal prudent.   Brief/Interim Summary: Patient admitted to the hospital with a working diagnosis of acute kidney injury on chronic kidney disease, complicated by hyperkalemia, uremia and bradycardia.  64 year old female with past medical history for chronic kidney disease stage V, type 2 diabetes mellitus, dyslipidemia, and hypertension. Recent hospitalization at [facility] for non-STEMI. Patient was readmitted one day after discharge due to bradycardia. At home patient had generalized weakness, fatigue, confusion and slurred speech. On her initial physical examination her heart rate was 45 bpm, blood pressure 142/32, respiratory rate 18, oxygen saturation 90%. Her lungs were clear to auscultation bilaterally, heart S1-S2, present, bradycardic, soft abdomen, positive bilateral lower extremity edema more right than left. Sodium 132, potassium 6.7, chloride 101, bicarb 20, glucose 306,BUN 106, creatinine 5.18, troponin I 482, BNP 2,129, white count 11.5, hemoglobin 9.2, hematocrit 27.5, platelets 88. SARS COVID-19 negative. Troponin I [phone] EKG 45 bpm, rightward axis, right bundle branch block,  junctional rhythm, no significant ST segment changes, negative T wave V1 through V3.  Patient received medical therapy for hyperkalemia including Kayexalate, calcium gluconate, and insulin.  On 10/4 patient underwent placement of tunneled HD catheter in the right internal jugular and posteriorly underwent hemodialysis.   On 10/5 patient underwent coronary angiography showing diffuse and severe coronary artery disease, including native and graft vessels. Positive in-stent restenosis LIMA to LAD. Patient was referred for reevaluation for bypass grafting. She had her first CABG approximately 16 years ago.  Final recommendations from cardiology/ CT surgery are to continue with aggressive medical management.   Patient underwent permament HD access, left upper extremity fistula creation on 10/12.   1.  Acute hyperkalemia, progression of chronic kidney disease stage V to end-stage renal disease.  Now patient is on hemodialysis.  Her electrolytes have been corrected.  Patient has a permanent access left upper extremity fistula, and a tunnel hemodialysis catheter at the right internal jugular vein.  Patient will follow up with nephrology as an outpatient.  Vascular surgery will follow patient in 5 to 6 weeks with duplex ultrasonography, 62-monthfollow-up for carotid evaluation.  2.  Severe coronary artery disease, recent non-ST elevation myocardial infarction, acute on chronic diastolic heart failure.  Patient had positive troponin elevation during this hospitalization, further work-up with echocardiography showed preserved LV systolic function, 50 to 621%ejection fraction.  Cardiac catheterization with diffuse disease.  Patient achieved euvolemia using torsemide and ultrafiltration.   Cardiothoracic surgery evaluated the patient, concluded that patient had a patent LIMA graft to the RCA and a patent LIMA to the LAD, left circumflex branches are not adequate to warrant redo CABG, especially in  the setting of significant comorbid conditions including renal failure on dialysis and obesity.  High operative  risk. Interventional cardiology review images, and concluded that best approach will be continue aggressive medical therapy with aspirin, clopidogrel and statin.  Because of blood pressure further nitrates and afterload reduction therapy is limited.  3.  Hypertension.  Patient had a hypotensive events, now her blood pressure has been stabilized, about 100 to 161 mmHg systolic.  Currently unable to pursue aggressive afterload reduction.  4.  Uncontrolled type II diabetes mellitus, steroid-induced hyperglycemia.  Patient received insulin therapy for glucose control, basal, sliding scale and pre-meal.  Patient received prednisone for acute gout flare, triggering steroid-induced hyperglycemia.  5.  Anemia chronic renal disease, recovered iron deficiency.  Iron panel showed serum iron 146, TIBC 239, transferrin saturation 61.  Patient did receive 1 unit packed red blood cell transfusion, recommendation to keep hemoglobin above 8 considering severe coronary disease. Patient continue EPO as an outpatient per nephrology recommendations.  6.  Anxiety.  Continue alprazolam.  7.  Hypothyroid.  Continue levothyroxine.  8.  Acute gout flare.  Right foot base of first metatarsal acute joint pain.  Uric acid 6.5.  Patient received 1 dose of prednisone and she has been started on allopurinol, 100 mg to take after dialysis.  9. Class 2 obesity. Continue follow up as outpatient.   Discharge Diagnoses:  Principal Problem:   End stage renal disease ([place]) Active Problems:   Class 2 obesity   Bradycardia   Diabetes mellitus with renal manifestation (HCC)   Atherosclerosis of native artery of extremity with intermittent claudication (HCC)   CAD (coronary artery disease)   Carotid artery disease (HCC)   Anemia   Chronic diastolic HF (heart failure) (HCC)   Hyperkalemia    Discharge  Instructions   Allergies as of 06/30/2020      Reactions   Penicillins Other (See Comments)   REACTION: Unknown, told as a child Has patient had a PCN reaction causing immediate rash, facial/tongue/throat swelling, SOB or lightheadedness with hypotension: Unknown Has patient had a PCN reaction causing severe rash involving mucus membranes or skin necrosis: Unknown Has patient had a PCN reaction that required hospitalization: Unknown Has patient had a PCN reaction occurring within the last 10 years: No If all of the above answers are "NO", then may proceed with Cephalosporin use.      Medication List    STOP taking these medications   ferrous sulfate 325 (65 FE) MG tablet   metoprolol tartrate 25 MG tablet Commonly known as: LOPRESSOR   sodium bicarbonate 650 MG tablet     TAKE these medications   acetaminophen 325 MG tablet Commonly known as: TYLENOL Take 2 tablets (650 mg total) by mouth every 6 (six) hours as needed for mild pain or moderate pain (or Fever >/= 101).   Albuterol Sulfate 108 (90 Base) MCG/ACT Aepb Commonly known as: PROAIR RESPICLICK Inhale 2 puffs into the lungs every 4 (four) hours as needed (Shortness of breath).   allopurinol 100 MG tablet Commonly known as: Zyloprim Take one tablet after hemodialysis, on Tuesday, Thursday and Saturday.   ALPRAZolam 0.5 MG tablet Commonly known as: XANAX Take 0.25-0.5 mg by mouth See admin instructions. Take 0.25 mg tab by mouth every morning & evening as needed  and .05 mg tab at bedtime   aspirin EC 81 MG tablet Take 81 mg by mouth daily.   atorvastatin 80 MG tablet Commonly known as: LIPITOR Take 1 tablet (80 mg total) by mouth every evening.   benzonatate 100 MG capsule Commonly known as: TESSALON Take 100  mg by mouth 3 (three) times daily as needed for cough.   clopidogrel 75 MG tablet Commonly known as: PLAVIX Take 1 tablet (75 mg total) by mouth daily.   diclofenac Sodium 1 % Gel Commonly known  as: VOLTAREN Apply 2 g topically 4 (four) times daily as needed (apply to right foot big toe as needed for pain.).   feeding supplement (NEPRO CARB STEADY) Liqd Take 237 mLs by mouth 2 (two) times daily between meals.   gabapentin 400 MG capsule Commonly known as: NEURONTIN Take 1 capsule (400 mg total) by mouth 2 (two) times daily.   ipratropium 17 MCG/ACT inhaler Commonly known as: ATROVENT HFA Inhale 2 puffs into the lungs See admin instructions. Inhale 2 puffs every 4 to 6 hrs as needed.   levothyroxine 125 MCG tablet Commonly known as: SYNTHROID Take 125 mcg by mouth daily.   multivitamin Tabs tablet Take 1 tablet by mouth at bedtime.   nitroGLYCERIN 0.4 MG SL tablet Commonly known as: NITROSTAT Place 0.4 mg under the tongue every 5 (five) minutes as needed for chest pain. Not to exceed 3 in 15 minute time frame   NovoLIN 70/30 ReliOn (70-30) 100 UNIT/ML injection Generic drug: insulin NPH-regular Human Inject 24 Units into the skin 2 (two) times daily with a meal. Am & PM   ondansetron 8 MG disintegrating tablet Commonly known as: Zofran ODT Take 1 tablet (8 mg total) by mouth every 8 (eight) hours as needed for nausea or vomiting.   oxyCODONE 5 MG immediate release tablet Commonly known as: Oxy IR/ROXICODONE Take 1 tablet (5 mg total) by mouth every 4 (four) hours as needed for severe pain.   pantoprazole 40 MG tablet Commonly known as: PROTONIX Take 1 tablet (40 mg total) by mouth 2 (two) times daily.   ranolazine 500 MG 12 hr tablet Commonly known as: RANEXA Take 1 tablet (500 mg total) by mouth 2 (two) times daily.   Vitamin D (Ergocalciferol) 1.25 MG (50000 UNIT) Caps capsule Commonly known as: DRISDOL Take 50,000 Units by mouth once a week.            Durable Medical Equipment  (From admission, onward)         Start     Ordered   06/30/20 0851  For home use only DME Tub bench  Once        06/30/20 2355          Follow-up Information     Isaiah Serge, NP Follow up on 07/01/2020.   Specialties: Cardiology, Radiology Why: Please arrive 15 minutes early for your 2:45pm post-hospital cardiology appointment Contact information: [address] [place] [address] [phone]        [facility] In 6 weeks.   Specialty: Vascular Surgery Why: Office will call you to arrange your appt (sent) Contact information: [place] [address] [phone]             Allergies  Allergen Reactions  . Penicillins Other (See Comments)    REACTION: Unknown, told as a child Has patient had a PCN reaction causing immediate rash, facial/tongue/throat swelling, SOB or lightheadedness with hypotension: Unknown Has patient had a PCN reaction causing severe rash involving mucus membranes or skin necrosis: Unknown Has patient had a PCN reaction that required hospitalization: Unknown Has patient had a PCN reaction occurring within the last 10 years: No If all of the above answers are "NO", then may proceed with  Cephalosporin use.      Consultants:   Nephrology   Vascular surgery   CT surgery   Cardiology     Procedures/Studies: DG Chest 1 View  Result Date: 06/18/2020 CLINICAL DATA:  Shortness of breath. Pt reports history of same during most recent admission. Hx diabetes, MI, HTN, CAD, asthma EXAM: CHEST  1 VIEW COMPARISON:  Chest x-ray 06/12/2020. FINDINGS: Stable cardiac enlargement that appears more prominent due to AP portable technique. Diffuse increase interstitial and airspace opacities. Possible bilateral pleural effusions. No pneumothorax. No acute osseous abnormality. IMPRESSION: Pulmonary findings suggestive of pulmonary edema versus atypical/viral pneumonia. Suggestion of bilateral pleural effusions. Consider lateral view for further evaluation. Electronically Signed   By: Iven Finn M.D.   On: 06/18/2020 16:46   DG Chest 2 View  Result Date:  06/12/2020 CLINICAL DATA:  Chest pain and fatigue throughout the day. EXAM: CHEST - 2 VIEW COMPARISON:  06/11/2020 FINDINGS: Cardiac enlargement. No vascular congestion, edema, or consolidation. No pleural effusions. No pneumothorax. Mediastinal contours appear intact. Postoperative changes in the mediastinum. No significant changes since prior study. IMPRESSION: Cardiac enlargement.  No active pulmonary disease. Electronically Signed   By: Lucienne Capers M.D.   On: 06/12/2020 21:40   DG Abd 1 View  Result Date: 06/13/2020 CLINICAL DATA:  Abdominal bloating EXAM: ABDOMEN - 1 VIEW COMPARISON:  Radiograph 01/09/2010, chest radiograph 06/12/2020 FINDINGS: No high-grade obstructive bowel gas pattern is seen. There is a moderate colonic stool burden. About limited evaluation for free air on this supine only radiograph. No Rigler sign or other secondary features. Cholecystectomy clips in the right upper quadrant. Vascular calcium noted in the upper abdomen. Prior sternotomy and CABG as well as vascular stenting of the coronaries seen in the lung bases with some mild atelectatic change. No acute or suspicious osseous abnormalities. Degenerative changes in the lumbar spine, hips and pelvis. IMPRESSION: 1. No high-grade obstructive bowel gas pattern. 2. Moderate colonic stool burden. 3. Prior cholecystectomy. 4. Bibasilar atelectasis. 5. Prior sternotomy and CABG. Electronically Signed   By: Lovena Le M.D.   On: 06/13/2020 02:30   CT Head Wo Contrast  Result Date: 06/18/2020 CLINICAL DATA:  Mental status change, unknown cause general weakness, groggy EXAM: CT HEAD WITHOUT CONTRAST TECHNIQUE: Contiguous axial images were obtained from the base of the skull through the vertex without intravenous contrast. COMPARISON:  CT head 10/01/2018. FINDINGS: Brain: No evidence of acute infarction, hemorrhage, hydrocephalus, extra-axial collection or mass lesion/mass effect. Vascular: No hyperdense vessel or unexpected  calcification. Skull: Negative for fracture or focal lesion. Sinuses/Orbits: No acute finding. Other: None. IMPRESSION: No acute intracranial abnormality. Electronically Signed   By: Iven Finn M.D.   On: 06/18/2020 16:37   NM Pulmonary Perfusion  Result Date: 06/16/2020 CLINICAL DATA:  Chest pain and hemoptysis EXAM: NUCLEAR MEDICINE PERFUSION LUNG SCAN TECHNIQUE: Perfusion images were obtained in multiple projections after intravenous injection of radiopharmaceutical. Views: Anterior, posterior, left lateral, right lateral, RPO, LPO, RAO, LAO RADIOPHARMACEUTICALS:  5.0 mCi Tc-82mMAA IV COMPARISON:  Chest radiograph June 12, 2020 FINDINGS: Radiotracer uptake is homogeneous and symmetric bilaterally. No perfusion defects evident. Heart noted to be prominent. IMPRESSION: No perfusion defects. No findings suggesting pulmonary embolus. Cardiac prominence noted. Electronically Signed   By: WLowella GripIII M.D.   On: 06/16/2020 13:09   CARDIAC CATHETERIZATION  Result Date: 06/22/2020  Occlusion of the sequential saphenous vein graft to the obtuse marginal and PDA (left).  Patent saphenous vein graft to the mid RCA.  Patent ostial to proximal saphenous vein graft stent.  Patent stent in the mid RCA beyond the graft insertion site  Patent LIMA to mid LAD.  Ostial LIMA stent has 50 to 70% ISR.  Cath engagement causes damping of pressure.  Totally occluded proximal RCA  Functionally occluded left main with very faint opacification of the circumflex system despite the absence of a clear channel.  99% stenosis of the ostial to proximal circumflex stent.  Total occlusion of the proximal LAD.  Normal left ventricular end-diastolic pressure. RECOMMENDATIONS:  Severe native and bypass graft disease.  No obvious target for PCI this time.  Revascularization strategy would likely include repeat coronary bypass surgery when left internal mammary ostial stent progresses.  US Venous Img Lower  Bilateral (DVT)  Result Date: 06/16/2020 CLINICAL DATA:  Chest pain and hemoptysis EXAM: BILATERAL LOWER EXTREMITY VENOUS DOPPLER ULTRASOUND TECHNIQUE: Gray-scale sonography with graded compression, as well as color Doppler and duplex ultrasound were performed to evaluate the lower extremity deep venous systems from the level of the common femoral vein and including the common femoral, femoral, profunda femoral, popliteal and calf veins including the posterior tibial, peroneal and gastrocnemius veins when visible. The superficial great saphenous vein was also interrogated. Spectral Doppler was utilized to evaluate flow at rest and with distal augmentation maneuvers in the common femoral, femoral and popliteal veins. COMPARISON:  None. FINDINGS: RIGHT LOWER EXTREMITY Common Femoral Vein: No evidence of thrombus. Normal compressibility, respiratory phasicity and response to augmentation. Saphenofemoral Junction: No evidence of thrombus. Normal compressibility and flow on color Doppler imaging. Profunda Femoral Vein: No evidence of thrombus. Normal compressibility and flow on color Doppler imaging. Femoral Vein: No evidence of thrombus. Normal compressibility, respiratory phasicity and response to augmentation. Popliteal Vein: No evidence of thrombus. Normal compressibility, respiratory phasicity and response to augmentation. Calf Veins: No evidence of thrombus. Normal compressibility and flow on color Doppler imaging. Superficial Great Saphenous Vein: No evidence of thrombus. Normal compressibility. Venous Reflux:  None. Other Findings:  Calf edema is noted. LEFT LOWER EXTREMITY Common Femoral Vein: No evidence of thrombus. Normal compressibility, respiratory phasicity and response to augmentation. Saphenofemoral Junction: No evidence of thrombus. Normal compressibility and flow on color Doppler imaging. Profunda Femoral Vein: No evidence of thrombus. Normal compressibility and flow on color Doppler imaging. Femoral  Vein: No evidence of thrombus. Normal compressibility, respiratory phasicity and response to augmentation. Popliteal Vein: No evidence of thrombus. Normal compressibility, respiratory phasicity and response to augmentation. Calf Veins: No evidence of thrombus. Normal compressibility and flow on color Doppler imaging. Superficial Great Saphenous Vein: No evidence of thrombus. Normal compressibility. Venous Reflux:  None. Other Findings:  Calf edema is noted. IMPRESSION: No evidence of deep venous thrombosis in either lower extremity. Electronically Signed   By: Inez Catalina M.D.   On: 06/16/2020 14:45   IR Fluoro Guide CV Line Right  Result Date: 06/21/2020 INDICATION: End-stage renal disease. In need of durable access for the initiation of hemodialysis. EXAM: TUNNELED CENTRAL VENOUS HEMODIALYSIS CATHETER PLACEMENT WITH ULTRASOUND AND FLUOROSCOPIC GUIDANCE MEDICATIONS: Vancomycin 1 gm IV . The antibiotic was given in an appropriate time interval prior to skin puncture. ANESTHESIA/SEDATION: Moderate (conscious) sedation was employed during this procedure. A total of Versed 1.5 mg and Fentanyl 550 mcg was administered intravenously. Moderate Sedation Time: 10 minutes. The patient's level of consciousness and vital signs were monitored continuously by radiology nursing throughout the procedure under my direct supervision. FLUOROSCOPY TIME:  18 seconds (5 mGy) COMPLICATIONS: None immediate. PROCEDURE: Informed  written consent was obtained from the patient after a discussion of the risks, benefits, and alternatives to treatment. Questions regarding the procedure were encouraged and answered. The right neck and chest were prepped with chlorhexidine in a sterile fashion, and a sterile drape was applied covering the operative field. Maximum barrier sterile technique with sterile gowns and gloves were used for the procedure. A timeout was performed prior to the initiation of the procedure. After creating a small venotomy  incision, a micropuncture kit was utilized to access the internal jugular vein. Real-time ultrasound guidance was utilized for vascular access including the acquisition of a permanent ultrasound image documenting patency of the accessed vessel. The microwire was utilized to measure appropriate catheter length. A stiff Glidewire was advanced to the level of the IVC and the micropuncture sheath was exchanged for a peel-away sheath. A palindrome tunneled hemodialysis catheter measuring 23 cm from tip to cuff was tunneled in a retrograde fashion from the anterior chest wall to the venotomy incision. The catheter was then placed through the peel-away sheath with tips ultimately positioned within the superior aspect of the right atrium. Final catheter positioning was confirmed and documented with a spot radiographic image. The catheter aspirates and flushes normally. The catheter was flushed with appropriate volume heparin dwells. The catheter exit site was secured with a 0-Prolene retention suture. The venotomy incision was closed with an interrupted 4-0 Vicryl, Dermabond and Steri-strips. Dressings were applied. The patient tolerated the procedure well without immediate post procedural complication. IMPRESSION: Successful placement of 23 cm tip to cuff tunneled hemodialysis catheter via the right internal jugular vein with tips terminating within the superior aspect of the right atrium. The catheter is ready for immediate use. Electronically Signed   By: Sandi Mariscal M.D.   On: 06/21/2020 09:39   IR US Guide Vasc Access Right  Result Date: 06/21/2020 INDICATION: End-stage renal disease. In need of durable access for the initiation of hemodialysis. EXAM: TUNNELED CENTRAL VENOUS HEMODIALYSIS CATHETER PLACEMENT WITH ULTRASOUND AND FLUOROSCOPIC GUIDANCE MEDICATIONS: Vancomycin 1 gm IV . The antibiotic was given in an appropriate time interval prior to skin puncture. ANESTHESIA/SEDATION: Moderate (conscious) sedation was  employed during this procedure. A total of Versed 1.5 mg and Fentanyl 550 mcg was administered intravenously. Moderate Sedation Time: 10 minutes. The patient's level of consciousness and vital signs were monitored continuously by radiology nursing throughout the procedure under my direct supervision. FLUOROSCOPY TIME:  18 seconds (5 mGy) COMPLICATIONS: None immediate. PROCEDURE: Informed written consent was obtained from the patient after a discussion of the risks, benefits, and alternatives to treatment. Questions regarding the procedure were encouraged and answered. The right neck and chest were prepped with chlorhexidine in a sterile fashion, and a sterile drape was applied covering the operative field. Maximum barrier sterile technique with sterile gowns and gloves were used for the procedure. A timeout was performed prior to the initiation of the procedure. After creating a small venotomy incision, a micropuncture kit was utilized to access the internal jugular vein. Real-time ultrasound guidance was utilized for vascular access including the acquisition of a permanent ultrasound image documenting patency of the accessed vessel. The microwire was utilized to measure appropriate catheter length. A stiff Glidewire was advanced to the level of the IVC and the micropuncture sheath was exchanged for a peel-away sheath. A palindrome tunneled hemodialysis catheter measuring 23 cm from tip to cuff was tunneled in a retrograde fashion from the anterior chest wall to the venotomy incision. The catheter was then placed  through the peel-away sheath with tips ultimately positioned within the superior aspect of the right atrium. Final catheter positioning was confirmed and documented with a spot radiographic image. The catheter aspirates and flushes normally. The catheter was flushed with appropriate volume heparin dwells. The catheter exit site was secured with a 0-Prolene retention suture. The venotomy incision was closed  with an interrupted 4-0 Vicryl, Dermabond and Steri-strips. Dressings were applied. The patient tolerated the procedure well without immediate post procedural complication. IMPRESSION: Successful placement of 23 cm tip to cuff tunneled hemodialysis catheter via the right internal jugular vein with tips terminating within the superior aspect of the right atrium. The catheter is ready for immediate use. Electronically Signed   By: Sandi Mariscal M.D.   On: 06/21/2020 09:39   ECHOCARDIOGRAM COMPLETE  Result Date: 06/13/2020    ECHOCARDIOGRAM REPORT   Patient Name:   Donna Howe Date of Exam: 06/13/2020 Medical Rec #:  [id number]        Height:       66.0 in Accession #:    [id number]       Weight:       218.0 lb Date of Birth:  03/08/1956       BSA:          2.074 m Patient Age:    47 years         BP:           136/61 mmHg Patient Gender: F                HR:           65 bpm. Exam Location:  [facility] Procedure: 2D Echo, Cardiac Doppler and Color Doppler Indications:    Chest pain  History:        Patient has prior history of Echocardiogram examinations, most                 recent 10/02/2019. CAD and Previous Myocardial Infarction, Prior                 CABG, PAD, Signs/Symptoms:Chest Pain; Risk Factors:Diabetes,                 Hypertension, Dyslipidemia and Current Smoker. CKD.  Sonographer:    Dustin Flock RDCS Referring Phys: [id number] ASIA B Laird  Sonographer Comments: Patient is morbidly obese. IMPRESSIONS  1. Left ventricular ejection fraction, by estimation, is 55 to 60%. The left ventricle has normal function. The left ventricle has no regional wall motion abnormalities. There is mild concentric left ventricular hypertrophy. Left ventricular diastolic parameters are consistent with Grade II diastolic dysfunction (pseudonormalization). Elevated left ventricular end-diastolic pressure.  2. Right ventricular systolic function is normal. The right ventricular size is normal. There is normal  pulmonary artery systolic pressure.  3. Left atrial size was mildly dilated.  4. Right atrial size was mildly dilated.  5. The mitral valve is normal in structure. Trivial mitral valve regurgitation. No evidence of mitral stenosis.  6. The aortic valve is tricuspid. Aortic valve regurgitation is not visualized. No aortic stenosis is present.  7. The inferior vena cava is normal in size with greater than 50% respiratory variability, suggesting right atrial pressure of 3 mmHg. FINDINGS  Left Ventricle: Left ventricular ejection fraction, by estimation, is 55 to 60%. The left ventricle has normal function. The left ventricle has no regional wall motion abnormalities. The left ventricular internal cavity size was normal in size. There is  mild concentric  left ventricular hypertrophy. Left ventricular diastolic parameters are consistent with Grade II diastolic dysfunction (pseudonormalization). Elevated left ventricular end-diastolic pressure. Right Ventricle: The right ventricular size is normal. No increase in right ventricular wall thickness. Right ventricular systolic function is normal. There is normal pulmonary artery systolic pressure. The tricuspid regurgitant velocity is 2.31 m/s, and  with an assumed right atrial pressure of 3 mmHg, the estimated right ventricular systolic pressure is 42.3 mmHg. Left Atrium: Left atrial size was mildly dilated. Right Atrium: Right atrial size was mildly dilated. Pericardium: There is no evidence of pericardial effusion. Mitral Valve: The mitral valve is normal in structure. Trivial mitral valve regurgitation. No evidence of mitral valve stenosis. Tricuspid Valve: The tricuspid valve is normal in structure. Tricuspid valve regurgitation is trivial. No evidence of tricuspid stenosis. Aortic Valve: The aortic valve is tricuspid. Aortic valve regurgitation is not visualized. No aortic stenosis is present. Pulmonic Valve: The pulmonic valve was normal in structure. Pulmonic valve  regurgitation is not visualized. No evidence of pulmonic stenosis. Aorta: The aortic root is normal in size and structure. Venous: The inferior vena cava is normal in size with greater than 50% respiratory variability, suggesting right atrial pressure of 3 mmHg. IAS/Shunts: No atrial level shunt detected by color flow Doppler.  LEFT VENTRICLE PLAX 2D LVIDd:         5.18 cm  Diastology LVIDs:         3.64 cm  LV e' medial:    6.09 cm/s LV PW:         1.31 cm  LV E/e' medial:  19.5 LV IVS:        1.31 cm  LV e' lateral:   7.29 cm/s LVOT diam:     1.80 cm  LV E/e' lateral: 16.3 LV SV:         79 LV SV Index:   38 LVOT Area:     2.54 cm  RIGHT VENTRICLE RV Basal diam:  3.50 cm RV S prime:     7.72 cm/s TAPSE (M-mode): 2.5 cm LEFT ATRIUM             Index       RIGHT ATRIUM           Index LA diam:        3.70 cm 1.78 cm/m  RA Area:     19.70 cm LA Vol (A2C):   29.1 ml 14.03 ml/m RA Volume:   62.30 ml  30.03 ml/m LA Vol (A4C):   67.1 ml 32.35 ml/m LA Biplane Vol: 48.3 ml 23.28 ml/m  AORTIC VALVE LVOT Vmax:   129.00 cm/s LVOT Vmean:  92.200 cm/s LVOT VTI:    0.312 m  AORTA Ao Root diam: 3.30 cm MITRAL VALVE                TRICUSPID VALVE MV Area (PHT): 2.71 cm     TR Peak grad:   21.3 mmHg MV Decel Time: 280 msec     TR Vmax:        231.00 cm/s MV E velocity: 119.00 cm/s MV A velocity: 104.00 cm/s  SHUNTS MV E/A ratio:  1.14         Systemic VTI:  0.31 m                             Systemic Diam: 1.80 cm Skeet Latch md Electronically signed by Skeet Latch md Signature Date/Time: 06/13/2020/12:43:41  PM    Final    VAS [place] UPPER EXT VEIN MAPPING (PRE-OP AVF)  Result Date: 06/22/2020 UPPER EXTREMITY VEIN MAPPING  Indications: Pre-access. Comparison Study: No prior studies. Performing Technologist: Oliver Hum RVT  Examination Guidelines: A complete evaluation includes B-mode imaging, spectral Doppler, color Doppler, and power Doppler as needed of all accessible portions of each vessel. Bilateral  testing is considered an integral part of a complete examination. Limited examinations for reoccurring indications may be performed as noted. +-----------------+-------------+----------+---------+ Right Cephalic   Diameter (cm)Depth (cm)Findings  +-----------------+-------------+----------+---------+ Shoulder             0.43        2.00             +-----------------+-------------+----------+---------+ Prox upper arm       0.29        2.10             +-----------------+-------------+----------+---------+ Mid upper arm        0.25        1.30             +-----------------+-------------+----------+---------+ Dist upper arm       0.33        0.94             +-----------------+-------------+----------+---------+ Antecubital fossa    0.35        0.55   branching +-----------------+-------------+----------+---------+ Prox forearm         0.22        0.89             +-----------------+-------------+----------+---------+ Mid forearm          0.22        0.64             +-----------------+-------------+----------+---------+ Dist forearm         0.15        0.47             +-----------------+-------------+----------+---------+ +-----------------+-------------+----------+--------------+ Right Basilic    Diameter (cm)Depth (cm)   Findings    +-----------------+-------------+----------+--------------+ Shoulder             0.47        1.34                  +-----------------+-------------+----------+--------------+ Mid upper arm        0.29        1.50                  +-----------------+-------------+----------+--------------+ Dist upper arm       0.18        1.21                  +-----------------+-------------+----------+--------------+ Antecubital fossa    0.16        1.30     branching    +-----------------+-------------+----------+--------------+ Prox forearm         0.11        0.51                   +-----------------+-------------+----------+--------------+ Mid forearm                             not visualized +-----------------+-------------+----------+--------------+ Distal forearm                          not visualized +-----------------+-------------+----------+--------------+ +-----------------+-------------+----------+----------------+ Left Cephalic    Diameter (cm)Depth (cm)  Findings     +-----------------+-------------+----------+----------------+ Shoulder             0.40        1.70                    +-----------------+-------------+----------+----------------+ Prox upper arm       0.55        1.44                    +-----------------+-------------+----------+----------------+ Mid upper arm        0.34        1.10      branching     +-----------------+-------------+----------+----------------+ Dist upper arm       0.37        0.68                    +-----------------+-------------+----------+----------------+ Antecubital fossa    0.47        0.32   Partial thrombus +-----------------+-------------+----------+----------------+ Prox forearm         0.38        0.74                    +-----------------+-------------+----------+----------------+ Mid forearm          0.46        0.44                    +-----------------+-------------+----------+----------------+ Dist forearm         0.45        0.39   Partial thrombus +-----------------+-------------+----------+----------------+ +-----------------+-------------+----------+--------------+ Left Basilic     Diameter (cm)Depth (cm)   Findings    +-----------------+-------------+----------+--------------+ Shoulder             0.37        1.80                  +-----------------+-------------+----------+--------------+ Mid upper arm        0.42        1.40                  +-----------------+-------------+----------+--------------+ Dist upper arm       0.43        1.50                   +-----------------+-------------+----------+--------------+ Antecubital fossa    0.39        0.93                  +-----------------+-------------+----------+--------------+ Prox forearm         0.10        0.31                  +-----------------+-------------+----------+--------------+ Mid forearm                             not visualized +-----------------+-------------+----------+--------------+ Distal forearm                          not visualized +-----------------+-------------+----------+--------------+ *See table(s) above for measurements and observations.   Diagnosing physician: Servando Snare MD Electronically signed by Servando Snare MD on 06/22/2020 at 12:18:51 PM.    Final    ECHOCARDIOGRAM LIMITED  Result Date: 06/19/2020    ECHOCARDIOGRAM LIMITED REPORT   Patient Name:   Donna Howe Date of Exam: 06/19/2020 Medical Rec #:  [id number]        Height:       66.0 in Accession #:    [id number]       Weight:       233.9 lb Date of Birth:  Jan 18, 1956       BSA:          2.137 m Patient Age:    28 years         BP:           136/52 mmHg Patient Gender: F                HR:           85 bpm. Exam Location:  Inpatient Procedure: Color Doppler, Limited Echo and Intracardiac Opacification Agent Indications:    Chest pain  History:        Patient has prior history of Echocardiogram examinations, most                 recent 06/13/2020. CAD and Previous Myocardial Infarction, Prior                 CABG; Risk Factors:Hypertension, Diabetes, Dyslipidemia and                 Current Smoker. CKD.  Sonographer:    Clayton Lefort RDCS (AE) Referring Phys: [id number] Arnoldo Lenis  Sonographer Comments: Patient is morbidly obese. Image acquisition challenging due to patient body habitus. IMPRESSIONS  1. Left ventricular ejection fraction, by estimation, is 55 to 60%. The left ventricle has normal function. The left ventricle has no regional wall motion abnormalities.  2. The mitral valve is  normal in structure. Mild to moderate mitral valve regurgitation. No evidence of mitral stenosis.  3. Limited echocardiogram to reevalaute LV function and wall motion. FINDINGS  Left Ventricle: Left ventricular ejection fraction, by estimation, is 55 to 60%. The left ventricle has normal function. The left ventricle has no regional wall motion abnormalities. Definity contrast agent was given IV to delineate the left ventricular  endocardial borders. Mitral Valve: The mitral valve is normal in structure. Mild to moderate mitral valve regurgitation. No evidence of mitral valve stenosis. LEFT VENTRICLE PLAX 2D LVIDd:         5.10 cm LVIDs:         3.40 cm LV PW:         1.50 cm LV IVS:        1.50 cm LVOT diam:     1.70 cm LVOT Area:     2.27 cm  LEFT ATRIUM         Index LA diam:    3.90 cm 1.82 cm/m   AORTA Ao Root diam: 3.30 cm  SHUNTS Systemic Diam: 1.70 cm Carlyle Dolly MD Electronically signed by Carlyle Dolly MD Signature Date/Time: 06/19/2020/3:14:35 PM    Final       Procedures: cardiac catheterization/ left upper extremity AV fistula creation.   Subjective: Patient is feeling well, no nausea or vomiting, no chest pain or dyspnea.   Discharge Exam: Vitals:   06/29/20 2036 06/30/20 0639  BP: (!) 113/50 (!) 108/50  Pulse: 73 (!) 51  Resp: 16 17  Temp: 98.1 F (36.7 C) 97.6 F (36.4 C)  SpO2: 100% 100%   Vitals:   06/29/20 1213 06/29/20 1547 06/29/20 2036 06/30/20 0639  BP: (!) 110/46 (!) 103/45 (!) 113/50 (!) 108/50  Pulse: 72 71 73 (!) 51  Resp:  15 16 17  Temp: 97.6 F (36.4 C) (!) 97.5 F (36.4 C) 98.1 F (36.7 C) 97.6 F (36.4 C)  TempSrc: Oral Oral Oral Oral  SpO2: 100% 100% 100% 100%  Weight:      Height:        General: Not in pain or dyspnea  Neurology: Awake and alert, non focal  E ENT: mild pallor, no icterus, oral mucosa moist Cardiovascular: No JVD. S1-S2 present, rhythmic, no gallops, rubs, or murmurs. Trace lower extremity edema. Pulmonary: positive  breath sounds bilaterally no wheezing, rhonchi or rales. Gastrointestinal. Abdomen soft and non tender Skin. No rashes Musculoskeletal: no joint deformities   The results of significant diagnostics from this hospitalization (including imaging, microbiology, ancillary and laboratory) are listed below for reference.     Microbiology: Recent Results (from the past 240 hour(s))  Surgical pcr screen     Status: None   Collection Time: 06/29/20  6:51 AM   Specimen: Nasal Mucosa; Nasal Swab  Result Value Ref Range Status   MRSA, PCR NEGATIVE NEGATIVE Final   Staphylococcus aureus NEGATIVE NEGATIVE Final    Comment: (NOTE) The Xpert SA Assay (FDA approved for NASAL specimens in patients 47 years of age and older), is one component of a comprehensive surveillance program. It is not intended to diagnose infection nor to guide or monitor treatment. Performed at [facility], [place] [address] [place], [place] [address]      Labs: BNP (last 3 results) Recent Labs    12/17/19 1329 06/18/20 1549  BNP 713.0* 4,431.5*   Basic Metabolic Panel: Recent Labs  Lab 06/26/20 0228 06/27/20 0220 06/27/20 2213 06/28/20 0358 06/29/20 0354  NA 140 136 130* 132* 136  K 4.5 4.5 5.3* 5.0 3.7  CL 102 99 95* 94* 96*  CO2 27 26 23 23 25   GLUCOSE 197* 233* 424* 391* 220*  BUN 19 36* 52* 58* 28*  CREATININE 3.41* 5.09* 5.91* 6.11* 3.98*  CALCIUM 8.7* 8.7* 8.7* 8.9 8.6*   Liver Function Tests: No results for input(s): AST, ALT, ALKPHOS, BILITOT, PROT, ALBUMIN in the last 168 hours. No results for input(s): LIPASE, AMYLASE in the last 168 hours. No results for input(s): AMMONIA in the last 168 hours. CBC: Recent Labs  Lab 06/24/20 0359 06/25/20 0135 06/26/20 0228 06/28/20 0839 06/29/20 0354  WBC 9.6 9.9 9.7 11.7* 10.5  NEUTROABS  --  6.3 6.1  --   --   HGB 7.5* 7.4* 8.9* 9.1* 8.7*  HCT 23.4* 23.1* 27.9* 27.9* 27.4*  MCV 93.2 93.5 92.4 90.0 91.9  PLT 122* 127* 149* 196 185    Cardiac Enzymes: No results for input(s): CKTOTAL, CKMB, CKMBINDEX, TROPONINI in the last 168 hours. BNP: Invalid input(s): POCBNP CBG: Recent Labs  Lab 06/29/20 1142 06/29/20 1257 06/29/20 1739 06/29/20 2144 06/30/20 0742  GLUCAP 180* 175* 258* 179* 223*   D-Dimer No results for input(s): DDIMER in the last 72 hours. Hgb A1c No results for input(s): HGBA1C in the last 72 hours. Lipid Profile No results for input(s): CHOL, HDL, LDLCALC, TRIG, CHOLHDL, LDLDIRECT in the last 72 hours. Thyroid function studies No results for input(s): TSH, T4TOTAL, T3FREE, THYROIDAB in the last 72 hours.  Invalid input(s): FREET3 Anemia work up No results for input(s): VITAMINB12, FOLATE, FERRITIN, TIBC, IRON, RETICCTPCT in the last 72 hours. Urinalysis    Component Value Date/Time   COLORURINE YELLOW 06/18/2020 1536   APPEARANCEUR CLOUDY (A) 06/18/2020 1536   LABSPEC 1.015 06/18/2020 1536   PHURINE 5.0 06/18/2020 1536  GLUCOSEU 150 (A) 06/18/2020 1536   HGBUR NEGATIVE 06/18/2020 1536   [place] 06/18/2020 [place] 06/18/2020 1536   PROTEINUR >=300 (A) 06/18/2020 1536   UROBILINOGEN 0.2 10/26/2013 2229   NITRITE NEGATIVE 06/18/2020 1536   LEUKOCYTESUR NEGATIVE 06/18/2020 1536   Sepsis Labs Invalid input(s): PROCALCITONIN,  WBC,  LACTICIDVEN Microbiology Recent Results (from the past 240 hour(s))  Surgical pcr screen     Status: None   Collection Time: 06/29/20  6:51 AM   Specimen: Nasal Mucosa; Nasal Swab  Result Value Ref Range Status   MRSA, PCR NEGATIVE NEGATIVE Final   Staphylococcus aureus NEGATIVE NEGATIVE Final    Comment: (NOTE) The Xpert SA Assay (FDA approved for NASAL specimens in patients 68 years of age and older), is one component of a comprehensive surveillance program. It is not intended to diagnose infection nor to guide or monitor treatment. Performed at [facility], [place] [address] [place], [place] [address]       Time coordinating discharge: 45 minutes  SIGNED:   Tawni Millers, MD  [facility] 06/30/2020, 8:14 AM

## 2020-06-30 NOTE — Care Management Important Message (Signed)
Important Message  Patient Details  Name: Donna Howe MRN: [id number] Date of Birth: 1955/12/11   Medicare Important Message Given:  Yes     Shelda Altes 06/30/2020, 10:24 AM

## 2020-06-30 NOTE — Progress Notes (Addendum)
[facility]  Subjective  - Soreness in the left UE.   Objective (!) 108/50 (!) 51 97.6 F (36.4 C) (Oral) 17 100%  Intake/Output Summary (Last 24 hours) at 06/30/2020 0801 Last data filed at 06/30/2020 0500 Gross per 24 hour  Intake 980 ml  Output 310 ml  Net 670 ml    Left UE incision healing well, palpable thrill in fistula  N/M/S intact left UE, hand well perfused. Alert & Oriented x 3   Assessment/Planning: POD # 1 Left brachial artery to cephalic vein AV fistula creation  She has a working catheter and is on HD TTS. Plan f/u in 5-6 weeks with duplex of AV fistula.  6 month f/u for carotid f/u. Encouraged movement of left UE to prevent contracture.  Stable from a vascular disposition.  Roxy Horseman 06/30/2020 8:01 AM --  Laboratory Lab Results: Recent Labs    06/28/20 0839 06/29/20 0354  WBC 11.7* 10.5  HGB 9.1* 8.7*  HCT 27.9* 27.4*  PLT 196 185   BMET Recent Labs    06/28/20 0358 06/29/20 0354  NA 132* 136  K 5.0 3.7  CL 94* 96*  CO2 23 25  GLUCOSE 391* 220*  BUN 58* 28*  CREATININE 6.11* 3.98*  CALCIUM 8.9 8.6*    COAG Lab Results  Component Value Date   INR 1.0 11/26/2019   INR 1.0 10/02/2019   INR 0.88 04/04/2018   No results found for: PTT  I have independently interviewed and examined patient and agree with PA assessment and plan above.   Azyria Osmon C. Donzetta Matters, MD [facility] Office: [phone] Pager: [phone]

## 2020-06-30 NOTE — Progress Notes (Signed)
[facility] Progress Note  Name: Donna Howe MRN: [id number] DOB: 1956-07-29  Subjective:  NAE S/p L BC AVF yesterday CLIP to [facility], THS completed   Intake/Output Summary (Last 24 hours) at 06/30/2020 1226 Last data filed at 06/30/2020 0500 Gross per 24 hour  Intake 480 ml  Output 300 ml  Net 180 ml    Vitals:  Vitals:   06/29/20 1213 06/29/20 1547 06/29/20 2036 06/30/20 0639  BP: (!) 110/46 (!) 103/45 (!) 113/50 (!) 108/50  Pulse: 72 71 73 (!) 51  Resp:  15 16 17   Temp: 97.6 F (36.4 C) (!) 97.5 F (36.4 C) 98.1 F (36.7 C) 97.6 F (36.4 C)  TempSrc: Oral Oral Oral Oral  SpO2: 100% 100% 100% 100%  Weight:      Height:         Physical Exam:  General: adult female in bed in NAD  HEENT: NCAT Eyes: EOMI sclera anicteric Neck: supple trachea midline Heart: S1S2 no rub Lungs: clear with normal WOB Abdomen: softly dist/obese habitus/nontender Extremities: trace LE Edema Skin: no rash on extremities exposed Psych normal mood and affect Neuro: conversant L BC AVF +T  Medications reviewed   Labs:  BMP Latest Ref Rng & Units 06/29/2020 06/28/2020 06/27/2020  Glucose 70 - 99 mg/dL 220(H) 391(H) 424(H)  BUN 8 - 23 mg/dL 28(H) 58(H) 52(H)  Creatinine 0.44 - 1.00 mg/dL 3.98(H) 6.11(H) 5.91(H)  Sodium 135 - 145 mmol/L 136 132(L) 130(L)  Potassium 3.5 - 5.1 mmol/L 3.7 5.0 5.3(H)  Chloride 98 - 111 mmol/L 96(L) 94(L) 95(L)  CO2 22 - 32 mmol/L 25 23 23   Calcium 8.9 - 10.3 mg/dL 8.6(L) 8.9 8.7(L)     Assessment/Plan:   # NSTEMI / CAD/ angina - s/p LHC 10/5 with diffuse disease --> no CABG targets, cardiology discussed and no PCI options - noted cardiology has stopped beta blocker due to bradycardia -Medically managing CAD - add imdur if BP will tolerate - won't currently  # CKD stage V with progression to end-stage renal disease -s/p TDC 10/4, 1st HD 10/4 and ongoing since then, last 10/8.  Next planned HD Thurs as outpt at [facility].  Has TDC and  s/p L BC AVF 10/11 [facility].    # Hypokalemia:  Supplemented to normal - on KCl po and 4K dialysate  # Acute on chronic diastolic CHF  - cont po diuretics and on discharge. She will tolerate her HD better if her need for UF is limited.  Her UOP is quite good.  - UF with HD as tolerated  # HTN  - BPs have been reasonable bordering on low  # Anemia of CKD  - started ESA - appears iron is ok (ferritin low but iron level and tsat ok) --> D/c po iron as she will be followed closely for need for IV iron.    Rexene Agent, MD 06/30/2020 12:26 PM

## 2020-06-30 NOTE — Progress Notes (Signed)
Renal Navigator met with patient at bedside, as it has been determined that she will discharge home today. We reviewed plan to start outpatient HD at her clinic tomorrow and that she will go an hour early on her first day in order to sign her paperwork. Patient states confirmation of plan and that her son will be picking her up today.  Navigator asked if she still plans to drive herself to HD, as this has concerned Navigator and CM. She states she has changed her plan completely, as she realizes that she is not "bouncing back" from this. She states her son and daughter-in-law went to her home last night and moved many of her belongings in to their home in [place], including her spare bed, in order for her to discharge to their today. She states she is feeling very good about this plan. She states her son has already made arrangements with his employer to be able to transport her to HD on TTS. She told Navigator that she has declined HH because she did not feel it was helpful to her boyfriend or her mother when both of them accepted it in the past. She states she knows what to do to keep herself active. She added that she hates to just sit around, which she has told Navigator on a past visit.  Patient is worried about knowing how to follow her renal diet above all other anxieties. We discussed being honest with herself about what she is consuming and being honest with the Dietician at her clinic in order to have a good working relationship. Navigator told patient that this is a learning process, and that she will learn this new diet, as long as she is committed. She states that she is. She stated appreciation for Renal Navigator's support and involvement in her care during this hospitalization. Renal Navigator notified HD clinic of plan and requested that Renal PA send orders.  Alphonzo Cruise, [place] Renal Navigator [phone]

## 2020-07-01 ENCOUNTER — Telehealth: Payer: Self-pay | Admitting: Nephrology

## 2020-07-01 ENCOUNTER — Ambulatory Visit: Payer: Medicare HMO | Admitting: Cardiology

## 2020-07-01 DIAGNOSIS — N186 End stage renal disease: Secondary | ICD-10-CM | POA: Diagnosis not present

## 2020-07-01 DIAGNOSIS — D631 Anemia in chronic kidney disease: Secondary | ICD-10-CM | POA: Diagnosis not present

## 2020-07-01 DIAGNOSIS — I214 Non-ST elevation (NSTEMI) myocardial infarction: Secondary | ICD-10-CM | POA: Diagnosis not present

## 2020-07-01 DIAGNOSIS — D509 Iron deficiency anemia, unspecified: Secondary | ICD-10-CM | POA: Diagnosis not present

## 2020-07-01 DIAGNOSIS — Z23 Encounter for immunization: Secondary | ICD-10-CM | POA: Diagnosis not present

## 2020-07-01 DIAGNOSIS — I5033 Acute on chronic diastolic (congestive) heart failure: Secondary | ICD-10-CM | POA: Diagnosis not present

## 2020-07-01 DIAGNOSIS — D689 Coagulation defect, unspecified: Secondary | ICD-10-CM | POA: Diagnosis not present

## 2020-07-01 DIAGNOSIS — E1122 Type 2 diabetes mellitus with diabetic chronic kidney disease: Secondary | ICD-10-CM | POA: Diagnosis not present

## 2020-07-01 DIAGNOSIS — N2581 Secondary hyperparathyroidism of renal origin: Secondary | ICD-10-CM | POA: Diagnosis not present

## 2020-07-01 DIAGNOSIS — Z992 Dependence on renal dialysis: Secondary | ICD-10-CM | POA: Diagnosis not present

## 2020-07-01 NOTE — Telephone Encounter (Signed)
Transition of care contact from inpatient facility  Date of Discharge: 06/30/20 Date of Contact: 07/01/20 Method of contact: Phone  Attempted to contact patient to discuss transition of care from inpatient admission. Patient did not answer the phone. Message was left on the patient's voicemail with call back number [phone].

## 2020-07-05 DIAGNOSIS — E46 Unspecified protein-calorie malnutrition: Secondary | ICD-10-CM | POA: Insufficient documentation

## 2020-07-06 ENCOUNTER — Telehealth: Payer: Self-pay

## 2020-07-06 NOTE — Telephone Encounter (Signed)
Patient called to report numbness and tingling in fingers and hand after recent fistula placement. Scheduled her for duplex and f/u this week.

## 2020-07-07 DIAGNOSIS — N189 Chronic kidney disease, unspecified: Secondary | ICD-10-CM | POA: Diagnosis not present

## 2020-07-07 DIAGNOSIS — I2510 Atherosclerotic heart disease of native coronary artery without angina pectoris: Secondary | ICD-10-CM | POA: Diagnosis not present

## 2020-07-07 DIAGNOSIS — Z992 Dependence on renal dialysis: Secondary | ICD-10-CM | POA: Diagnosis not present

## 2020-07-07 DIAGNOSIS — E875 Hyperkalemia: Secondary | ICD-10-CM | POA: Diagnosis not present

## 2020-07-07 DIAGNOSIS — E1165 Type 2 diabetes mellitus with hyperglycemia: Secondary | ICD-10-CM | POA: Diagnosis not present

## 2020-07-07 DIAGNOSIS — I10 Essential (primary) hypertension: Secondary | ICD-10-CM | POA: Diagnosis not present

## 2020-07-07 DIAGNOSIS — D631 Anemia in chronic kidney disease: Secondary | ICD-10-CM | POA: Diagnosis not present

## 2020-07-07 DIAGNOSIS — I214 Non-ST elevation (NSTEMI) myocardial infarction: Secondary | ICD-10-CM | POA: Diagnosis not present

## 2020-07-08 ENCOUNTER — Other Ambulatory Visit: Payer: Self-pay

## 2020-07-08 ENCOUNTER — Ambulatory Visit: Payer: Medicare HMO | Admitting: Nutrition

## 2020-07-08 DIAGNOSIS — N1832 Chronic kidney disease, stage 3b: Secondary | ICD-10-CM

## 2020-07-08 NOTE — Progress Notes (Signed)
This was a telemedicine visit Patient location: Home Provider location: Office  Cardiology Office Note  Date: 07/09/2020   ID: ZAKERIA KULZER, DOB 11/25/1955, MRN [id number]  PCP:  [facility]  Cardiologist:  Rozann Lesches, MD Electrophysiologist:  None   Chief Complaint: Hospital follow up  History of Present Illness: Donna Howe is a 64 y.o. female with a history of CAD status post CABG 2005 (multiple PCI's since), carotid artery disease,CKD stage III, HTN, HLD, hypothyroidism, PAD, sinus bradycardia, DM 2.  Last encounter with Dr. Domenic Polite 04/16/2020.  She did not describe any active angina on medical therapy.  Lab work from PCP had been requested.  Cardiac medications were reviewed and were stable.  She denied any recent nitroglycerin use.  CAD was being managed medically with concurrent CKD stage IV.  Creatinine 3.2 in April.  High risk of contrast nephropathy and cardiac catheterization was being avoided at that time.  She was continuing aspirin, Plavix, Norvasc, Lipitor, hydralazine, Isordil.  She remained on statin therapy. CKD stage IV.  Recent hospitalization at [facility] for unstable angina/NSTEMI 06/12/2020: She presented with chest pain described as intermittent since June 10, 2020.  She had been taken approximately 2 nitroglycerin on a daily basis with relief.  Stated chest pain lasted approximately 15 to 20 minutes.  Worse with exertion.  06/12/2020 took a second nitroglycerin without relief of chest discomfort which radiated down her left arm.  Troponins [id number].  She  had some associated shortness of breath EMS was activated.  She had recently been treated for respiratory infection with Levaquin by PCP on 06/09/2020.  She was discharged on 06/17/2020   She was readmitted 1 day after discharge for USA/NSTEMI due to bradycardia. Other s/s included generalized weakness, fatigue, confusion and slurred speech. Initial heart rate was 45.  Potassium was 6.7, bicarb 20, glucose 306, BUN 106, creatinine 5.18, troponin 482, BNP 2129. WBC 11.5, hemoglobin 9.2. Hematocrit 27.5, platelets 88. Troponins [id number]. She was treated for hyperkalemia including; Kayexalate, calcium gluconate, and insulin. HD catheter was placed and patient underwent hemodialysis. October 5 patient underwent cardiac catheterization showing diffuse severe CAD including native and graft vessels. Positive ISR LIMA-LAD. She was referred for bypass grafting. Previously had CABG approximately 16 years prior. Recommendations from the cardiology/CT surgery were to continue aggressive medical management. She was to follow with nephrology. After hemodialysis and electrolytes were corrected. She was to follow with vascular surgery in 5 to 6 weeks for duplex ultrasonography for 76-month follow-up evaluation for carotid disease. Patient was considered a high operative risk for redo CABG. Interventional cardiology reviewed images include occluded. Notes state best approach was to continue aggressive medical therapy with aspirin, Plavix, statin.  Spoke to patient by phone today.  States she is undergoing dialysis and has both a tunneled catheter and right AV fistula.  States she is finding it hard to adjust to the hemodialysis.  Currently denies any chest pain, pressure, tightness, neck, arm, back, or jaw pain.  Denies any shortness of breath or lower extremity edema since starting dialysis.  She states that seems to have resolved.  She states she is trying to coordinate [facility] to get her new medications filled.  She states she did not have a prescription for Ranexa although it was one of her discharge medications.  I advised her we would send that prescription in to make sure it it is filled.  She admits to some occasional dizziness but no near syncopal  or syncopal episode.   Past Medical History:  Diagnosis Date  . Anemia   . Asthma   . CAD (coronary artery  disease)    Multivessel s/p CABG 2005, numerous PCIs since that time  . Carotid artery disease (HCC)    R CEA  . Chronic kidney disease (CKD), stage III (moderate) (HCC)   . Essential hypertension   . Gout   . Hyperlipidemia   . Hypothyroidism   . Myocardial infarction ([place])   . PAD (peripheral artery disease) ([place])    Dr. Kellie Simmering  . S/P angioplasty with stent- DES to [facility] and to LIMA to LAD with DES 04/09/18.   04/10/2018  . SBO (small bowel obstruction) ([place]) 2011   Status post lysis of adhesions & hernia repair  . Sinus bradycardia   . Thrombocytopenia, unspecified ([place])   . Type 2 diabetes mellitus ([place])   . Umbilical hernia     Past Surgical History:  Procedure Laterality Date  . AV FISTULA PLACEMENT Left 06/29/2020   Procedure: LEFT ARM ARTERIOVENOUS (AV) FISTULA;  Surgeon: Waynetta Sandy, MD;  Location: [facility];  Service: Vascular;  Laterality: Left;  ARM  . CESAREAN SECTION  1984  . CHOLECYSTECTOMY  2010  . CORONARY ARTERY BYPASS GRAFT  2005  . CORONARY BALLOON ANGIOPLASTY N/A 05/31/2017   Procedure: CORONARY BALLOON ANGIOPLASTY;  Surgeon: Jettie Booze, MD;  Location: [facility] CV [facility];  Service: Cardiovascular;  Laterality: N/A;  . CORONARY STENT INTERVENTION N/A 05/31/2017   Procedure: CORONARY STENT INTERVENTION;  Surgeon: Jettie Booze, MD;  Location: [place];  Service: Cardiovascular;  Laterality: N/A;  . CORONARY STENT INTERVENTION N/A 04/09/2018   Procedure: CORONARY STENT INTERVENTION;  Surgeon: Jettie Booze, MD;  Location: [place] [facility];  Service: Cardiovascular;  Laterality: N/A;  SVG RCA  . ENDARTERECTOMY Right 04/18/2013   Procedure: ENDARTERECTOMY CAROTID;  Surgeon: Mal Misty, MD;  Location: [place];  Service: Vascular;  Laterality: Right;  . HERNIA REPAIR  1989  . Incisional hernia repair x2  03/04/2010   Laparoscopic with 35cm mesh by Dr Ronnald Collum  . IR FLUORO GUIDE CV LINE RIGHT  06/21/2020  . IR US GUIDE VASC  ACCESS RIGHT  06/21/2020  . LEFT HEART CATH AND CORS/GRAFTS ANGIOGRAPHY N/A 05/31/2017   Procedure: LEFT HEART CATH AND CORS/GRAFTS ANGIOGRAPHY;  Surgeon: Jettie Booze, MD;  Location: [facility];  Service: Cardiovascular;  Laterality: N/A;  . LEFT HEART CATH AND CORS/GRAFTS ANGIOGRAPHY N/A 04/08/2018   Procedure: LEFT HEART CATH AND CORS/GRAFTS ANGIOGRAPHY;  Surgeon: Jettie Booze, MD;  Location: [facility];  Service: Cardiovascular;  Laterality: N/A;  . LEFT HEART CATH AND CORS/GRAFTS ANGIOGRAPHY N/A 06/22/2020   Procedure: LEFT HEART CATH AND CORS/GRAFTS ANGIOGRAPHY;  Surgeon: Belva Crome, MD;  Location: [place] [facility];  Service: Cardiovascular;  Laterality: N/A;  . LEFT HEART CATHETERIZATION WITH CORONARY ANGIOGRAM N/A 12/19/2012   Procedure: LEFT HEART CATHETERIZATION WITH CORONARY ANGIOGRAM;  Surgeon: Josue Hector, MD;  Location: [facility] CATH LAB;  Service: Cardiovascular;  Laterality: N/A;  . LEFT HEART CATHETERIZATION WITH CORONARY/GRAFT ANGIOGRAM N/A 04/19/2013   Procedure: LEFT HEART CATHETERIZATION WITH Beatrix Fetters;  Surgeon: Lorretta Harp, MD;  Location: [facility] CATH LAB;  Service: Cardiovascular;  Laterality: N/A;  . PATCH ANGIOPLASTY Right 04/18/2013   Procedure: PATCH ANGIOPLASTY Right Internal Carotid Artery;  Surgeon: Mal Misty, MD;  Location: [facility];  Service: Vascular;  Laterality: Right;  .  PERCUTANEOUS CORONARY STENT INTERVENTION (PCI-S) Right 12/19/2012   Procedure: PERCUTANEOUS CORONARY STENT INTERVENTION (PCI-S);  Surgeon: Josue Hector, MD;  Location: [facility] CATH LAB;  Service: Cardiovascular;  Laterality: Right;  . SHOULDER SURGERY      Current Outpatient Medications  Medication Sig Dispense Refill  . acetaminophen (TYLENOL) 325 MG tablet Take 2 tablets (650 mg total) by mouth every 6 (six) hours as needed for mild pain or moderate pain (or Fever >/= 101).    . Albuterol Sulfate 108 (90 Base) MCG/ACT AEPB Inhale 2 puffs into the lungs  every 4 (four) hours as needed (Shortness of breath).     Donna Kitchen allopurinol (ZYLOPRIM) 100 MG tablet Take one tablet after hemodialysis, on Tuesday, Thursday and Saturday. 30 tablet 11  . ALPRAZolam (XANAX) 0.5 MG tablet Take 0.25-0.5 mg by mouth See admin instructions. Take 0.25 mg tab by mouth every morning & evening as needed  and .05 mg tab at bedtime    . aspirin EC 81 MG tablet Take 81 mg by mouth daily.     Donna Kitchen atorvastatin (LIPITOR) 80 MG tablet Take 1 tablet (80 mg total) by mouth every evening.    . benzonatate (TESSALON) 100 MG capsule Take 100 mg by mouth 3 (three) times daily as needed for cough.    . clopidogrel (PLAVIX) 75 MG tablet Take 1 tablet (75 mg total) by mouth daily. 90 tablet 3  . diclofenac Sodium (VOLTAREN) 1 % GEL Apply 2 g topically 4 (four) times daily as needed (apply to right foot big toe as needed for pain.). 50 g 0  . gabapentin (NEURONTIN) 400 MG capsule Take 1 capsule (400 mg total) by mouth 2 (two) times daily.    Donna Kitchen ipratropium (ATROVENT HFA) 17 MCG/ACT inhaler Inhale 2 puffs into the lungs See admin instructions. Inhale 2 puffs every 4 to 6 hrs as needed.    Donna Kitchen levothyroxine (SYNTHROID, LEVOTHROID) 125 MCG tablet Take 125 mcg by mouth daily.     . multivitamin (RENA-VIT) TABS tablet Take 1 tablet by mouth at bedtime. 30 tablet 0  . nitroGLYCERIN (NITROSTAT) 0.4 MG SL tablet Place 0.4 mg under the tongue every 5 (five) minutes as needed for chest pain. Not to exceed 3 in 15 minute time frame    . NOVOLIN 70/30 RELION (70-30) 100 UNIT/ML injection Inject 24 Units into the skin 2 (two) times daily with a meal. Am & PM    . Nutritional Supplements (FEEDING SUPPLEMENT, NEPRO CARB STEADY,) LIQD Take 237 mLs by mouth 2 (two) times daily between meals. 14220 mL 0  . ondansetron (ZOFRAN ODT) 8 MG disintegrating tablet Take 1 tablet (8 mg total) by mouth every 8 (eight) hours as needed for nausea or vomiting. 20 tablet 0  . oxyCODONE (OXY IR/ROXICODONE) 5 MG immediate release  tablet Take 1 tablet (5 mg total) by mouth every 4 (four) hours as needed for severe pain. 10 tablet 0  . pantoprazole (PROTONIX) 40 MG tablet Take 1 tablet (40 mg total) by mouth 2 (two) times daily. 60 tablet 1  . ranolazine (RANEXA) 500 MG 12 hr tablet Take 1 tablet (500 mg total) by mouth 2 (two) times daily. 60 tablet 1  . Vitamin D, Ergocalciferol, (DRISDOL) 1.25 MG (50000 UNIT) CAPS capsule Take 50,000 Units by mouth once a week.     No current facility-administered medications for this visit.   Allergies:  Penicillins   Social History: The patient  reports that she quit smoking about 7 years ago.  Her smoking use included cigarettes. She has a 20.00 pack-year smoking history. She has never used smokeless tobacco. She reports that she does not drink alcohol and does not use drugs.   Family History: The patient's family history includes AAA (abdominal aortic aneurysm) in her father; Diabetes in her mother; Heart disease in her brother and mother; Hyperlipidemia in her mother and son; Hypertension in her brother, father, mother, and son; Thyroid disease in her father.   ROS:  Please see the history of present illness. Otherwise, complete review of systems is positive for none.  All other systems are reviewed and negative.   Physical Exam: VS:  Ht 5\' 5"  (1.651 m)   Wt 204 lb (92.5 kg)   BMI 33.95 kg/m , BMI Body mass index is 33.95 kg/m.  Wt Readings from Last 3 Encounters:  07/09/20 204 lb (92.5 kg)  06/29/20 205 lb 3.2 oz (93.1 kg)  06/15/20 236 lb 12.4 oz (107.4 kg)     Normal speech pattern without evidence of dyspnea, cough or wheezing noted during phone conversation.   ECG:  EKG June 23, 2020: Normal sinus rhythm rate of 70, possible left atrial enlargement, ST and T wave abnormality consider lateral ischemia.  Recent Labwork: 06/18/2020: B Natriuretic Peptide 2,129.0; Magnesium 2.6; TSH 1.057 06/19/2020: ALT 211; AST 129 06/29/2020: BUN 28; Creatinine, Ser 3.98;  Hemoglobin 8.7; Platelets 185; Potassium 3.7; Sodium 136     Component Value Date/Time   CHOL 133 06/13/2020 0120   TRIG 169 (H) 06/13/2020 0120   HDL 32 (L) 06/13/2020 0120   CHOLHDL 4.2 06/13/2020 0120   VLDL 34 06/13/2020 0120   LDLCALC 67 06/13/2020 0120    Other Studies Reviewed Today:  06/22/2020  LEFT HEART CATH AND CORS/GRAFTS ANGIOGRAPHY  Conclusion   Occlusion of the sequential saphenous vein graft to the obtuse marginal and PDA (left).  Patent saphenous vein graft to the mid RCA.  Patent ostial to proximal saphenous vein graft stent.  Patent stent in the mid RCA beyond the graft insertion site  Patent LIMA to mid LAD.  Ostial LIMA stent has 50 to 70% ISR.  Cath engagement causes damping of pressure.  Totally occluded proximal RCA  Functionally occluded left main with very faint opacification of the circumflex system despite the absence of a clear channel.  99% stenosis of the ostial to proximal circumflex stent.  Total occlusion of the proximal LAD.  Normal left ventricular end-diastolic pressure.  RECOMMENDATIONS:   Severe native and bypass graft disease.  No obvious target for PCI this time.  Revascularization strategy would likely include repeat coronary bypass surgery when left internal mammary ostial stent progresses. Diagnostic Dominance: Co-dominant     Lexiscan Myoview 10/06/2015:  There was no ST segment deviation noted during stress.  This is a low risk study.  The left ventricular ejection fraction is normal (55-65%).  Inferolateral defect probably secondary to adjacent gut radiotracer uptake, cannot rule moderate inferolateral infarct with mild-periinfarct ischemia. The inferolateral wall has normal wall motion. Overall imaging findings support low risk.  Echocardiogram 10/02/2019: 1. Left ventricular ejection fraction, by visual estimation, is 60 to  65%. The left ventricle has normal function. There is mildly increased  left  ventricular hypertrophy.  2. Left ventricular diastolic parameters are indeterminate.  3. The left ventricle has no regional wall motion abnormalities.  4. Global right ventricle has normal systolic function.The right  ventricular size is normal. No increase in right ventricular wall  thickness.  5. Left atrial size  was normal.  6. Right atrial size was normal.  7. The mitral valve is normal in structure. Trivial mitral valve  regurgitation. No evidence of mitral stenosis.  8. The tricuspid valve is not well visualized.  9. The aortic valve is normal in structure. Aortic valve regurgitation is  not visualized. No evidence of aortic valve sclerosis or stenosis.  10. The pulmonic valve was not well visualized. Pulmonic valve  regurgitation is not visualized.   Chest x-ray 01/13/2020: FINDINGS: Cardiomegaly status post median sternotomy. Both lungs are clear. The visualized skeletal structures are unremarkable.  IMPRESSION: Cardiomegaly without acute abnormality of the lungs  Assessment and Plan:  1. CAD in native artery   2. Essential hypertension   3. Chronic kidney disease (CKD), stage IV (severe) ([facility])     1. CAD in native artery In the interim since last visit with Dr. Domenic Polite patient was admitted to [facility] for [place], NSTEMI 06/12/2020 and discharged 06/17/2020.  She was treated and released.  The day after discharge she presented again on 06/18/2020 at 1450 with complaints of general weakness, fatigue, slurring of speech.  She was bradycardic and had 1+ bilateral pitting edema.  She had acute EKG changes with hyperkalemia.  Admitted to [facility] and underwent cardiac catheterization.  See catheter results above.Recommendations from catheterization: Noted severe native and bypass graft disease.  No obvious target for PCI.  Revascularization strategy would include repeat CABG surgery once LIMA ostial stent stenosis progresses.  Continue aspirin 81 mg daily.   Atorvastatin 80 mg daily. Plavix 75 mg.  Sublingual nitroglycerin as needed.  Ranexa 500 mg every 12 hours.  2. Essential hypertension Discharge blood pressure [place] on 06/30/2020 was 108/50.  No blood pressure measurement available today due to switch to telemedicine visit.  3. Chronic kidney disease (CKD), stage IV (severe) ([place]) Patient started hemodialysis after tunnel catheter placed.  She has a right AV graft recently placed.  Patient states she is having a hard time adjusting to dialysis.  States it has improved her dyspnea and lower extremity edema.  Continue to follow nephrology.  Medication Adjustments/Labs and Tests Ordered: Current medicines are reviewed at length with the patient today.  Concerns regarding medicines are outlined above.   Time spent with patient via telemedicine visit 10 minutes.  Disposition: Follow-up with Dr. Domenic Polite or APP 3 months  Signed, Levell July, NP 07/09/2020 11:36 AM    [facility] [place], [place], [place] [address] Phone: [phone]; Fax: [phone]

## 2020-07-09 ENCOUNTER — Telehealth (INDEPENDENT_AMBULATORY_CARE_PROVIDER_SITE_OTHER): Payer: Medicare HMO | Admitting: Family Medicine

## 2020-07-09 ENCOUNTER — Encounter: Payer: Self-pay | Admitting: Family Medicine

## 2020-07-09 VITALS — Ht 65.0 in | Wt 204.0 lb

## 2020-07-09 DIAGNOSIS — I10 Essential (primary) hypertension: Secondary | ICD-10-CM | POA: Diagnosis not present

## 2020-07-09 DIAGNOSIS — N184 Chronic kidney disease, stage 4 (severe): Secondary | ICD-10-CM

## 2020-07-09 DIAGNOSIS — I2510 Atherosclerotic heart disease of native coronary artery without angina pectoris: Secondary | ICD-10-CM

## 2020-07-09 MED ORDER — RANOLAZINE ER 500 MG PO TB12: 500.0000 mg | ORAL_TABLET | Freq: Two times a day (BID) | ORAL | 1 refills | Status: DC

## 2020-07-09 NOTE — Patient Instructions (Addendum)
Your physician recommends that you schedule a follow-up appointment in: [place]  Your physician recommends that you continue on your current medications as directed. Please refer to the Current Medication list given to you today.  WE HAVE SENT RANEXA 500 MG TWICE DAILY TO [facility]  Thank you for choosing [facility]!!

## 2020-07-12 DIAGNOSIS — R69 Illness, unspecified: Secondary | ICD-10-CM | POA: Diagnosis not present

## 2020-07-17 DIAGNOSIS — D631 Anemia in chronic kidney disease: Secondary | ICD-10-CM | POA: Diagnosis not present

## 2020-07-17 DIAGNOSIS — Z23 Encounter for immunization: Secondary | ICD-10-CM | POA: Diagnosis not present

## 2020-07-17 DIAGNOSIS — E1122 Type 2 diabetes mellitus with diabetic chronic kidney disease: Secondary | ICD-10-CM | POA: Diagnosis not present

## 2020-07-17 DIAGNOSIS — N2581 Secondary hyperparathyroidism of renal origin: Secondary | ICD-10-CM | POA: Diagnosis not present

## 2020-07-17 DIAGNOSIS — D509 Iron deficiency anemia, unspecified: Secondary | ICD-10-CM | POA: Diagnosis not present

## 2020-07-17 DIAGNOSIS — Z992 Dependence on renal dialysis: Secondary | ICD-10-CM | POA: Diagnosis not present

## 2020-07-17 DIAGNOSIS — Z794 Long term (current) use of insulin: Secondary | ICD-10-CM | POA: Diagnosis not present

## 2020-07-17 DIAGNOSIS — D689 Coagulation defect, unspecified: Secondary | ICD-10-CM | POA: Diagnosis not present

## 2020-07-17 DIAGNOSIS — I10 Essential (primary) hypertension: Secondary | ICD-10-CM | POA: Diagnosis not present

## 2020-07-17 DIAGNOSIS — N186 End stage renal disease: Secondary | ICD-10-CM | POA: Diagnosis not present

## 2020-07-17 DIAGNOSIS — E1165 Type 2 diabetes mellitus with hyperglycemia: Secondary | ICD-10-CM | POA: Diagnosis not present

## 2020-07-18 DIAGNOSIS — N186 End stage renal disease: Secondary | ICD-10-CM | POA: Diagnosis not present

## 2020-07-18 DIAGNOSIS — Z992 Dependence on renal dialysis: Secondary | ICD-10-CM | POA: Diagnosis not present

## 2020-07-18 DIAGNOSIS — E1122 Type 2 diabetes mellitus with diabetic chronic kidney disease: Secondary | ICD-10-CM | POA: Diagnosis not present

## 2020-07-22 DIAGNOSIS — E1122 Type 2 diabetes mellitus with diabetic chronic kidney disease: Secondary | ICD-10-CM | POA: Diagnosis not present

## 2020-07-22 DIAGNOSIS — Z23 Encounter for immunization: Secondary | ICD-10-CM | POA: Diagnosis not present

## 2020-07-22 DIAGNOSIS — N186 End stage renal disease: Secondary | ICD-10-CM | POA: Diagnosis not present

## 2020-07-22 DIAGNOSIS — D689 Coagulation defect, unspecified: Secondary | ICD-10-CM | POA: Diagnosis not present

## 2020-07-22 DIAGNOSIS — D631 Anemia in chronic kidney disease: Secondary | ICD-10-CM | POA: Diagnosis not present

## 2020-07-22 DIAGNOSIS — D509 Iron deficiency anemia, unspecified: Secondary | ICD-10-CM | POA: Diagnosis not present

## 2020-07-22 DIAGNOSIS — N2581 Secondary hyperparathyroidism of renal origin: Secondary | ICD-10-CM | POA: Diagnosis not present

## 2020-07-22 DIAGNOSIS — Z992 Dependence on renal dialysis: Secondary | ICD-10-CM | POA: Diagnosis not present

## 2020-07-23 ENCOUNTER — Other Ambulatory Visit: Payer: Self-pay

## 2020-07-23 ENCOUNTER — Encounter: Payer: Self-pay | Admitting: Vascular Surgery

## 2020-07-23 ENCOUNTER — Ambulatory Visit (HOSPITAL_COMMUNITY)
Admission: RE | Admit: 2020-07-23 | Discharge: 2020-07-23 | Disposition: A | Discharge status: Home / Self Care | Payer: Medicare HMO | Source: Ambulatory Visit | Attending: Vascular Surgery | Admitting: Vascular Surgery

## 2020-07-23 ENCOUNTER — Ambulatory Visit: Payer: Self-pay | Admitting: Vascular Surgery

## 2020-07-23 VITALS — BP 142/75 | HR 75 | Temp 97.4°F | Resp 20 | Ht 65.0 in | Wt 204.0 lb

## 2020-07-23 DIAGNOSIS — E1165 Type 2 diabetes mellitus with hyperglycemia: Secondary | ICD-10-CM | POA: Diagnosis not present

## 2020-07-23 DIAGNOSIS — N186 End stage renal disease: Secondary | ICD-10-CM

## 2020-07-23 DIAGNOSIS — D519 Vitamin B12 deficiency anemia, unspecified: Secondary | ICD-10-CM | POA: Diagnosis not present

## 2020-07-23 DIAGNOSIS — E559 Vitamin D deficiency, unspecified: Secondary | ICD-10-CM | POA: Diagnosis not present

## 2020-07-23 DIAGNOSIS — E7800 Pure hypercholesterolemia, unspecified: Secondary | ICD-10-CM | POA: Diagnosis not present

## 2020-07-23 DIAGNOSIS — Z992 Dependence on renal dialysis: Secondary | ICD-10-CM

## 2020-07-23 DIAGNOSIS — E1140 Type 2 diabetes mellitus with diabetic neuropathy, unspecified: Secondary | ICD-10-CM | POA: Diagnosis not present

## 2020-07-23 DIAGNOSIS — N1832 Chronic kidney disease, stage 3b: Secondary | ICD-10-CM | POA: Insufficient documentation

## 2020-07-23 DIAGNOSIS — J449 Chronic obstructive pulmonary disease, unspecified: Secondary | ICD-10-CM | POA: Diagnosis not present

## 2020-07-23 DIAGNOSIS — E039 Hypothyroidism, unspecified: Secondary | ICD-10-CM | POA: Diagnosis not present

## 2020-07-23 DIAGNOSIS — I771 Stricture of artery: Secondary | ICD-10-CM

## 2020-07-23 DIAGNOSIS — E782 Mixed hyperlipidemia: Secondary | ICD-10-CM | POA: Diagnosis not present

## 2020-07-23 NOTE — Progress Notes (Signed)
Patient ID: Donna Howe, female   DOB: March 22, 1956, 64 y.o.   MRN: [id number]  Reason for Consult: Follow-up   Referred by Practice, [facility]  Subjective:     HPI:  Donna Howe is a 64 y.o. female follows up 3 weeks after left brachial artery to cephalic vein AV fistula creation.  She states over the last 2 weeks she has had numbness of her fingers on the left.  She does not have any tissue loss or ulceration.  She is on dialysis via right IJ catheter on Tuesdays, Thursdays, Saturdays.  She does not take any blood thinners.  She is on aspirin and Plavix daily.  Past Medical History:  Diagnosis Date  . Anemia   . Asthma   . CAD (coronary artery disease)    Multivessel s/p CABG 2005, numerous PCIs since that time  . Carotid artery disease (HCC)    R CEA  . Chronic kidney disease (CKD), stage III (moderate) (HCC)   . Essential hypertension   . Gout   . Hyperlipidemia   . Hypothyroidism   . Myocardial infarction ([place])   . PAD (peripheral artery disease) ([place])    Dr. Kellie Simmering  . S/P angioplasty with stent- DES to [facility] and to [facility] to LAD with DES 04/09/18.   04/10/2018  . SBO (small bowel obstruction) ([place]) 2011   Status post lysis of adhesions & hernia repair  . Sinus bradycardia   . Thrombocytopenia, unspecified ([place])   . Type 2 diabetes mellitus ([place])   . Umbilical hernia    Family History  Problem Relation Age of Onset  . Diabetes Mother   . Heart disease Mother        before age 23  . Hyperlipidemia Mother   . Hypertension Mother   . Thyroid disease Father   . Hypertension Father   . AAA (abdominal aortic aneurysm) Father   . Heart disease Brother        before age 64  . Hypertension Brother   . Hyperlipidemia Son   . Hypertension Son    Past Surgical History:  Procedure Laterality Date  . AV FISTULA PLACEMENT Left 06/29/2020   Procedure: LEFT ARM ARTERIOVENOUS (AV) FISTULA;  Surgeon: Waynetta Sandy, MD;  Location: [place];  Service:  Vascular;  Laterality: Left;  ARM  . CESAREAN SECTION  1984  . CHOLECYSTECTOMY  2010  . CORONARY ARTERY BYPASS GRAFT  2005  . CORONARY BALLOON ANGIOPLASTY N/A 05/31/2017   Procedure: CORONARY BALLOON ANGIOPLASTY;  Surgeon: Jettie Booze, MD;  Location: [place] CV LAB;  Service: Cardiovascular;  Laterality: N/A;  . CORONARY STENT INTERVENTION N/A 05/31/2017   Procedure: CORONARY STENT INTERVENTION;  Surgeon: Jettie Booze, MD;  Location: [place] CV LAB;  Service: Cardiovascular;  Laterality: N/A;  . CORONARY STENT INTERVENTION N/A 04/09/2018   Procedure: CORONARY STENT INTERVENTION;  Surgeon: Jettie Booze, MD;  Location: [place] [facility] LAB;  Service: Cardiovascular;  Laterality: N/A;  SVG RCA  . ENDARTERECTOMY Right 04/18/2013   Procedure: ENDARTERECTOMY CAROTID;  Surgeon: Mal Misty, MD;  Location: [place];  Service: Vascular;  Laterality: Right;  . HERNIA REPAIR  1989  . Incisional hernia repair x2  03/04/2010   Laparoscopic with 35cm mesh by Dr Ronnald Collum  . IR FLUORO GUIDE CV LINE RIGHT  06/21/2020  . IR US GUIDE VASC ACCESS RIGHT  06/21/2020  . LEFT HEART CATH AND CORS/GRAFTS ANGIOGRAPHY N/A 05/31/2017   Procedure: LEFT HEART  CATH AND CORS/GRAFTS ANGIOGRAPHY;  Surgeon: Jettie Booze, MD;  Location: [place] [facility] LAB;  Service: Cardiovascular;  Laterality: N/A;  . LEFT HEART CATH AND CORS/GRAFTS ANGIOGRAPHY N/A 04/08/2018   Procedure: LEFT HEART CATH AND CORS/GRAFTS ANGIOGRAPHY;  Surgeon: Jettie Booze, MD;  Location: [place] CV [facility];  Service: Cardiovascular;  Laterality: N/A;  . LEFT HEART CATH AND CORS/GRAFTS ANGIOGRAPHY N/A 06/22/2020   Procedure: LEFT HEART CATH AND CORS/GRAFTS ANGIOGRAPHY;  Surgeon: Belva Crome, MD;  Location: [place] CV LAB;  Service: Cardiovascular;  Laterality: N/A;  . LEFT HEART CATHETERIZATION WITH CORONARY ANGIOGRAM N/A 12/19/2012   Procedure: LEFT HEART CATHETERIZATION WITH CORONARY ANGIOGRAM;  Surgeon: Josue Hector, MD;  Location: [facility] CATH LAB;  Service: Cardiovascular;  Laterality: N/A;  . LEFT HEART CATHETERIZATION WITH CORONARY/GRAFT ANGIOGRAM N/A 04/19/2013   Procedure: LEFT HEART CATHETERIZATION WITH Beatrix Fetters;  Surgeon: Lorretta Harp, MD;  Location: [facility] CATH LAB;  Service: Cardiovascular;  Laterality: N/A;  . PATCH ANGIOPLASTY Right 04/18/2013   Procedure: PATCH ANGIOPLASTY Right Internal Carotid Artery;  Surgeon: Mal Misty, MD;  Location: [place];  Service: Vascular;  Laterality: Right;  . PERCUTANEOUS CORONARY STENT INTERVENTION (PCI-S) Right 12/19/2012   Procedure: PERCUTANEOUS CORONARY STENT INTERVENTION (PCI-S);  Surgeon: Josue Hector, MD;  Location: [facility] CATH LAB;  Service: Cardiovascular;  Laterality: Right;  . SHOULDER SURGERY      Short Social History:  Social History   Tobacco Use  . Smoking status: Former Smoker    Packs/day: 1.00    Years: 20.00    Pack years: 20.00    Types: Cigarettes    Quit date: 12/10/2012    Years since quitting: 7.6  . Smokeless tobacco: Never Used  Substance Use Topics  . Alcohol use: No    Alcohol/week: 0.0 standard drinks    Allergies  Allergen Reactions  . Penicillins Other (See Comments)    REACTION: Unknown, told as a child Has patient had a PCN reaction causing immediate rash, facial/tongue/throat swelling, SOB or lightheadedness with hypotension: Unknown Has patient had a PCN reaction causing severe rash involving mucus membranes or skin necrosis: Unknown Has patient had a PCN reaction that required hospitalization: Unknown Has patient had a PCN reaction occurring within the last 10 years: No If all of the above answers are "NO", then may proceed with Cephalosporin use.     Current Outpatient Medications  Medication Sig Dispense Refill  . acetaminophen (TYLENOL) 325 MG tablet Take 2 tablets (650 mg total) by mouth every 6 (six) hours as needed for mild pain or moderate pain (or Fever >/= 101).    . Albuterol Sulfate  108 (90 Base) MCG/ACT AEPB Inhale 2 puffs into the lungs every 4 (four) hours as needed (Shortness of breath).     Marland Kitchen allopurinol (ZYLOPRIM) 100 MG tablet Take one tablet after hemodialysis, on Tuesday, Thursday and Saturday. 30 tablet 11  . ALPRAZolam (XANAX) 0.5 MG tablet Take 0.25-0.5 mg by mouth See admin instructions. Take 0.25 mg tab by mouth every morning & evening as needed  and .05 mg tab at bedtime    . aspirin EC 81 MG tablet Take 81 mg by mouth daily.     Marland Kitchen atorvastatin (LIPITOR) 80 MG tablet Take 1 tablet (80 mg total) by mouth every evening.    . benzonatate (TESSALON) 100 MG capsule Take 100 mg by mouth 3 (three) times daily as needed for cough.    . clopidogrel (PLAVIX) 75 MG tablet  Take 1 tablet (75 mg total) by mouth daily. 90 tablet 3  . diclofenac Sodium (VOLTAREN) 1 % GEL Apply 2 g topically 4 (four) times daily as needed (apply to right foot big toe as needed for pain.). 50 g 0  . gabapentin (NEURONTIN) 400 MG capsule Take 1 capsule (400 mg total) by mouth 2 (two) times daily.    Marland Kitchen ipratropium (ATROVENT HFA) 17 MCG/ACT inhaler Inhale 2 puffs into the lungs See admin instructions. Inhale 2 puffs every 4 to 6 hrs as needed.    Marland Kitchen levothyroxine (SYNTHROID, LEVOTHROID) 125 MCG tablet Take 125 mcg by mouth daily.     . multivitamin (RENA-VIT) TABS tablet Take 1 tablet by mouth at bedtime. 30 tablet 0  . nitroGLYCERIN (NITROSTAT) 0.4 MG SL tablet Place 0.4 mg under the tongue every 5 (five) minutes as needed for chest pain. Not to exceed 3 in 15 minute time frame    . NOVOLIN 70/30 RELION (70-30) 100 UNIT/ML injection Inject 24 Units into the skin 2 (two) times daily with a meal. Am & PM    . Nutritional Supplements (FEEDING SUPPLEMENT, NEPRO CARB STEADY,) LIQD Take 237 mLs by mouth 2 (two) times daily between meals. 14220 mL 0  . ondansetron (ZOFRAN ODT) 8 MG disintegrating tablet Take 1 tablet (8 mg total) by mouth every 8 (eight) hours as needed for nausea or vomiting. 20 tablet 0    . oxyCODONE (OXY IR/ROXICODONE) 5 MG immediate release tablet Take 1 tablet (5 mg total) by mouth every 4 (four) hours as needed for severe pain. 10 tablet 0  . pantoprazole (PROTONIX) 40 MG tablet Take 1 tablet (40 mg total) by mouth 2 (two) times daily. 60 tablet 1  . ranolazine (RANEXA) 500 MG 12 hr tablet Take 1 tablet (500 mg total) by mouth 2 (two) times daily. 60 tablet 1  . Vitamin D, Ergocalciferol, (DRISDOL) 1.25 MG (50000 UNIT) CAPS capsule Take 50,000 Units by mouth once a week.     No current facility-administered medications for this visit.    Review of Systems  Constitutional:  Constitutional negative. HENT: HENT negative.  Eyes: Eyes negative.  Cardiovascular: Cardiovascular negative.  GI: Gastrointestinal negative.  Musculoskeletal: Musculoskeletal negative.  Skin: Skin negative.  Neurological: Positive for numbness.  Hematologic: Hematologic/lymphatic negative.  Psychiatric: Psychiatric negative.        Objective:  Objective   Vitals:   07/23/20 1045  BP: (!) 142/75  Pulse: 75  Resp: 20  Temp: (!) 97.4 F (36.3 C)  SpO2: 95%  Weight: 204 lb (92.5 kg)  Height: 5\' 5"  (1.651 m)   Body mass index is 33.95 kg/m.  Physical Exam HENT:     Head: Normocephalic.     Nose:     Comments: Wearing a mask Eyes:     Pupils: Pupils are equal, round, and reactive to light.  Cardiovascular:     Comments: Left radial pulses not palpable except with compression of the fistula which then is 2+ palpable Pulmonary:     Effort: Pulmonary effort is normal.  Abdominal:     General: Abdomen is flat.     Palpations: Abdomen is soft.  Skin:    General: Skin is warm and dry.     Capillary Refill: Capillary refill takes less than 2 seconds.  Neurological:     General: No focal deficit present.     Mental Status: She is alert.  Psychiatric:        Mood and Affect: Mood normal.  Behavior: Behavior normal.        Thought Content: Thought content normal.         Judgment: Judgment normal.     Data: Left upper extremity steal summary:  Arteriovenous fistula is not within normal limits due to abnormal  arteriovenous  fistula with stenosis noted.  Because of diminished thrill, a duplex of the fistula was performed to  verify  patency. The AVF was patent with a flow volume of 1193 cm/sec. Imaging of  the  left subclavian artery showed an increase in velocities from 194 cm/sec to  438  cm/sec suggestive of >50% stenosis. Left digital pressure increase from 39  mm Hg  to 90 mm Hg with AVF compression suggesting some steal. Right brachial  artery  systolic pressure was 389 mm Hg while left ulnar artery systolic pressure  55 mm  Hg.  Findings suggest Left subclavian artery stenosis.      Assessment/Plan:    64 year old female now with end-stage renal disease has a new left arm cephalic vein fistula.  She does have evidence of steal.  She also has evidence of subclavian stenosis on the left.  I have offered her continued watchful waiting versus aortogram of the arch possible intervention of her left subclavian or brachial arteries and left upper extremity angiography versus primary ligation of the left arm fistula.  Given that she may also have issues with the right upper extremity we have elected for arch aortogram possible intervention we will do this on a nondialysis day in the very near future.  She can continue aspirin Plavix throughout the perioperative time period.      Waynetta Sandy MD [facility]

## 2020-07-23 NOTE — H&P (View-Only) (Signed)
Patient ID: Donna Howe, female   DOB: 1956/04/19, 64 y.o.   MRN: [id number]  Reason for Consult: Follow-up   Referred by Practice, [facility]  Subjective:     HPI:  Donna Howe is a 64 y.o. female follows up 3 weeks after left brachial artery to cephalic vein AV fistula creation.  She states over the last 2 weeks she has had numbness of her fingers on the left.  She does not have any tissue loss or ulceration.  She is on dialysis via right IJ catheter on Tuesdays, Thursdays, Saturdays.  She does not take any blood thinners.  She is on aspirin and Plavix daily.  Past Medical History:  Diagnosis Date  . Anemia   . Asthma   . CAD (coronary artery disease)    Multivessel s/p CABG 2005, numerous PCIs since that time  . Carotid artery disease (HCC)    R CEA  . Chronic kidney disease (CKD), stage III (moderate) (HCC)   . Essential hypertension   . Gout   . Hyperlipidemia   . Hypothyroidism   . Myocardial infarction ([place])   . PAD (peripheral artery disease) ([place])    Dr. Kellie Simmering  . S/P angioplasty with stent- DES to [facility] and to [facility] to LAD with DES 04/09/18.   04/10/2018  . SBO (small bowel obstruction) ([place]) 2011   Status post lysis of adhesions & hernia repair  . Sinus bradycardia   . Thrombocytopenia, unspecified ([place])   . Type 2 diabetes mellitus ([place])   . Umbilical hernia    Family History  Problem Relation Age of Onset  . Diabetes Mother   . Heart disease Mother        before age 45  . Hyperlipidemia Mother   . Hypertension Mother   . Thyroid disease Father   . Hypertension Father   . AAA (abdominal aortic aneurysm) Father   . Heart disease Brother        before age 53  . Hypertension Brother   . Hyperlipidemia Son   . Hypertension Son    Past Surgical History:  Procedure Laterality Date  . AV FISTULA PLACEMENT Left 06/29/2020   Procedure: LEFT ARM ARTERIOVENOUS (AV) FISTULA;  Surgeon: Waynetta Sandy, MD;  Location: [place];  Service:  Vascular;  Laterality: Left;  ARM  . CESAREAN SECTION  1984  . CHOLECYSTECTOMY  2010  . CORONARY ARTERY BYPASS GRAFT  2005  . CORONARY BALLOON ANGIOPLASTY N/A 05/31/2017   Procedure: CORONARY BALLOON ANGIOPLASTY;  Surgeon: Jettie Booze, MD;  Location: [place] CV LAB;  Service: Cardiovascular;  Laterality: N/A;  . CORONARY STENT INTERVENTION N/A 05/31/2017   Procedure: CORONARY STENT INTERVENTION;  Surgeon: Jettie Booze, MD;  Location: [place] CV LAB;  Service: Cardiovascular;  Laterality: N/A;  . CORONARY STENT INTERVENTION N/A 04/09/2018   Procedure: CORONARY STENT INTERVENTION;  Surgeon: Jettie Booze, MD;  Location: [place] CV LAB;  Service: Cardiovascular;  Laterality: N/A;  SVG RCA  . ENDARTERECTOMY Right 04/18/2013   Procedure: ENDARTERECTOMY CAROTID;  Surgeon: Mal Misty, MD;  Location: [place];  Service: Vascular;  Laterality: Right;  . HERNIA REPAIR  1989  . Incisional hernia repair x2  03/04/2010   Laparoscopic with 35cm mesh by Dr Ronnald Collum  . IR FLUORO GUIDE CV LINE RIGHT  06/21/2020  . IR US GUIDE VASC ACCESS RIGHT  06/21/2020  . LEFT HEART CATH AND CORS/GRAFTS ANGIOGRAPHY N/A 05/31/2017   Procedure: LEFT HEART  CATH AND CORS/GRAFTS ANGIOGRAPHY;  Surgeon: Jettie Booze, MD;  Location: [place] CV LAB;  Service: Cardiovascular;  Laterality: N/A;  . LEFT HEART CATH AND CORS/GRAFTS ANGIOGRAPHY N/A 04/08/2018   Procedure: LEFT HEART CATH AND CORS/GRAFTS ANGIOGRAPHY;  Surgeon: Jettie Booze, MD;  Location: [place] [facility] LAB;  Service: Cardiovascular;  Laterality: N/A;  . LEFT HEART CATH AND CORS/GRAFTS ANGIOGRAPHY N/A 06/22/2020   Procedure: LEFT HEART CATH AND CORS/GRAFTS ANGIOGRAPHY;  Surgeon: Belva Crome, MD;  Location: [place] CV LAB;  Service: Cardiovascular;  Laterality: N/A;  . LEFT HEART CATHETERIZATION WITH CORONARY ANGIOGRAM N/A 12/19/2012   Procedure: LEFT HEART CATHETERIZATION WITH CORONARY ANGIOGRAM;  Surgeon: Josue Hector, MD;  Location: [facility] CATH LAB;  Service: Cardiovascular;  Laterality: N/A;  . LEFT HEART CATHETERIZATION WITH CORONARY/GRAFT ANGIOGRAM N/A 04/19/2013   Procedure: LEFT HEART CATHETERIZATION WITH Beatrix Fetters;  Surgeon: Lorretta Harp, MD;  Location: [facility] CATH LAB;  Service: Cardiovascular;  Laterality: N/A;  . PATCH ANGIOPLASTY Right 04/18/2013   Procedure: PATCH ANGIOPLASTY Right Internal Carotid Artery;  Surgeon: Mal Misty, MD;  Location: [place];  Service: Vascular;  Laterality: Right;  . PERCUTANEOUS CORONARY STENT INTERVENTION (PCI-S) Right 12/19/2012   Procedure: PERCUTANEOUS CORONARY STENT INTERVENTION (PCI-S);  Surgeon: Josue Hector, MD;  Location: [facility] CATH LAB;  Service: Cardiovascular;  Laterality: Right;  . SHOULDER SURGERY      Short Social History:  Social History   Tobacco Use  . Smoking status: Former Smoker    Packs/day: 1.00    Years: 20.00    Pack years: 20.00    Types: Cigarettes    Quit date: 12/10/2012    Years since quitting: 7.6  . Smokeless tobacco: Never Used  Substance Use Topics  . Alcohol use: No    Alcohol/week: 0.0 standard drinks    Allergies  Allergen Reactions  . Penicillins Other (See Comments)    REACTION: Unknown, told as a child Has patient had a PCN reaction causing immediate rash, facial/tongue/throat swelling, SOB or lightheadedness with hypotension: Unknown Has patient had a PCN reaction causing severe rash involving mucus membranes or skin necrosis: Unknown Has patient had a PCN reaction that required hospitalization: Unknown Has patient had a PCN reaction occurring within the last 10 years: No If all of the above answers are "NO", then may proceed with Cephalosporin use.     Current Outpatient Medications  Medication Sig Dispense Refill  . acetaminophen (TYLENOL) 325 MG tablet Take 2 tablets (650 mg total) by mouth every 6 (six) hours as needed for mild pain or moderate pain (or Fever >/= 101).    . Albuterol Sulfate  108 (90 Base) MCG/ACT AEPB Inhale 2 puffs into the lungs every 4 (four) hours as needed (Shortness of breath).     Marland Kitchen allopurinol (ZYLOPRIM) 100 MG tablet Take one tablet after hemodialysis, on Tuesday, Thursday and Saturday. 30 tablet 11  . ALPRAZolam (XANAX) 0.5 MG tablet Take 0.25-0.5 mg by mouth See admin instructions. Take 0.25 mg tab by mouth every morning & evening as needed  and .05 mg tab at bedtime    . aspirin EC 81 MG tablet Take 81 mg by mouth daily.     Marland Kitchen atorvastatin (LIPITOR) 80 MG tablet Take 1 tablet (80 mg total) by mouth every evening.    . benzonatate (TESSALON) 100 MG capsule Take 100 mg by mouth 3 (three) times daily as needed for cough.    . clopidogrel (PLAVIX) 75 MG tablet  Take 1 tablet (75 mg total) by mouth daily. 90 tablet 3  . diclofenac Sodium (VOLTAREN) 1 % GEL Apply 2 g topically 4 (four) times daily as needed (apply to right foot big toe as needed for pain.). 50 g 0  . gabapentin (NEURONTIN) 400 MG capsule Take 1 capsule (400 mg total) by mouth 2 (two) times daily.    Marland Kitchen ipratropium (ATROVENT HFA) 17 MCG/ACT inhaler Inhale 2 puffs into the lungs See admin instructions. Inhale 2 puffs every 4 to 6 hrs as needed.    Marland Kitchen levothyroxine (SYNTHROID, LEVOTHROID) 125 MCG tablet Take 125 mcg by mouth daily.     . multivitamin (RENA-VIT) TABS tablet Take 1 tablet by mouth at bedtime. 30 tablet 0  . nitroGLYCERIN (NITROSTAT) 0.4 MG SL tablet Place 0.4 mg under the tongue every 5 (five) minutes as needed for chest pain. Not to exceed 3 in 15 minute time frame    . NOVOLIN 70/30 RELION (70-30) 100 UNIT/ML injection Inject 24 Units into the skin 2 (two) times daily with a meal. Am & PM    . Nutritional Supplements (FEEDING SUPPLEMENT, NEPRO CARB STEADY,) LIQD Take 237 mLs by mouth 2 (two) times daily between meals. 14220 mL 0  . ondansetron (ZOFRAN ODT) 8 MG disintegrating tablet Take 1 tablet (8 mg total) by mouth every 8 (eight) hours as needed for nausea or vomiting. 20 tablet 0    . oxyCODONE (OXY IR/ROXICODONE) 5 MG immediate release tablet Take 1 tablet (5 mg total) by mouth every 4 (four) hours as needed for severe pain. 10 tablet 0  . pantoprazole (PROTONIX) 40 MG tablet Take 1 tablet (40 mg total) by mouth 2 (two) times daily. 60 tablet 1  . ranolazine (RANEXA) 500 MG 12 hr tablet Take 1 tablet (500 mg total) by mouth 2 (two) times daily. 60 tablet 1  . Vitamin D, Ergocalciferol, (DRISDOL) 1.25 MG (50000 UNIT) CAPS capsule Take 50,000 Units by mouth once a week.     No current facility-administered medications for this visit.    Review of Systems  Constitutional:  Constitutional negative. HENT: HENT negative.  Eyes: Eyes negative.  Cardiovascular: Cardiovascular negative.  GI: Gastrointestinal negative.  Musculoskeletal: Musculoskeletal negative.  Skin: Skin negative.  Neurological: Positive for numbness.  Hematologic: Hematologic/lymphatic negative.  Psychiatric: Psychiatric negative.        Objective:  Objective   Vitals:   07/23/20 1045  BP: (!) 142/75  Pulse: 75  Resp: 20  Temp: (!) 97.4 F (36.3 C)  SpO2: 95%  Weight: 204 lb (92.5 kg)  Height: 5\' 5"  (1.651 m)   Body mass index is 33.95 kg/m.  Physical Exam HENT:     Head: Normocephalic.     Nose:     Comments: Wearing a mask Eyes:     Pupils: Pupils are equal, round, and reactive to light.  Cardiovascular:     Comments: Left radial pulses not palpable except with compression of the fistula which then is 2+ palpable Pulmonary:     Effort: Pulmonary effort is normal.  Abdominal:     General: Abdomen is flat.     Palpations: Abdomen is soft.  Skin:    General: Skin is warm and dry.     Capillary Refill: Capillary refill takes less than 2 seconds.  Neurological:     General: No focal deficit present.     Mental Status: She is alert.  Psychiatric:        Mood and Affect: Mood normal.  Behavior: Behavior normal.        Thought Content: Thought content normal.         Judgment: Judgment normal.     Data: Left upper extremity steal summary:  Arteriovenous fistula is not within normal limits due to abnormal  arteriovenous  fistula with stenosis noted.  Because of diminished thrill, a duplex of the fistula was performed to  verify  patency. The AVF was patent with a flow volume of 1193 cm/sec. Imaging of  the  left subclavian artery showed an increase in velocities from 194 cm/sec to  438  cm/sec suggestive of >50% stenosis. Left digital pressure increase from 39  mm Hg  to 90 mm Hg with AVF compression suggesting some steal. Right brachial  artery  systolic pressure was 629 mm Hg while left ulnar artery systolic pressure  55 mm  Hg.  Findings suggest Left subclavian artery stenosis.      Assessment/Plan:    64 year old female now with end-stage renal disease has a new left arm cephalic vein fistula.  She does have evidence of steal.  She also has evidence of subclavian stenosis on the left.  I have offered her continued watchful waiting versus aortogram of the arch possible intervention of her left subclavian or brachial arteries and left upper extremity angiography versus primary ligation of the left arm fistula.  Given that she may also have issues with the right upper extremity we have elected for arch aortogram possible intervention we will do this on a nondialysis day in the very near future.  She can continue aspirin Plavix throughout the perioperative time period.      Waynetta Sandy MD [facility]

## 2020-07-28 DIAGNOSIS — M129 Arthropathy, unspecified: Secondary | ICD-10-CM | POA: Diagnosis not present

## 2020-07-28 DIAGNOSIS — Z0001 Encounter for general adult medical examination with abnormal findings: Secondary | ICD-10-CM | POA: Diagnosis not present

## 2020-07-28 DIAGNOSIS — N189 Chronic kidney disease, unspecified: Secondary | ICD-10-CM | POA: Diagnosis not present

## 2020-07-28 DIAGNOSIS — Z6833 Body mass index (BMI) 33.0-33.9, adult: Secondary | ICD-10-CM | POA: Diagnosis not present

## 2020-07-29 ENCOUNTER — Encounter (HOSPITAL_COMMUNITY): Payer: Medicare HMO

## 2020-07-29 ENCOUNTER — Other Ambulatory Visit (HOSPITAL_COMMUNITY): Payer: Medicare HMO

## 2020-07-29 ENCOUNTER — Ambulatory Visit: Payer: Medicare HMO | Admitting: Vascular Surgery

## 2020-07-30 ENCOUNTER — Other Ambulatory Visit (HOSPITAL_COMMUNITY)
Admission: RE | Admit: 2020-07-30 | Discharge: 2020-07-30 | Disposition: A | Discharge status: Home / Self Care | Payer: Medicare HMO | Source: Ambulatory Visit | Attending: Vascular Surgery | Admitting: Vascular Surgery

## 2020-07-30 DIAGNOSIS — N189 Chronic kidney disease, unspecified: Secondary | ICD-10-CM | POA: Diagnosis not present

## 2020-07-30 DIAGNOSIS — Z992 Dependence on renal dialysis: Secondary | ICD-10-CM | POA: Diagnosis not present

## 2020-07-30 DIAGNOSIS — I2510 Atherosclerotic heart disease of native coronary artery without angina pectoris: Secondary | ICD-10-CM | POA: Diagnosis not present

## 2020-07-30 DIAGNOSIS — D631 Anemia in chronic kidney disease: Secondary | ICD-10-CM | POA: Diagnosis not present

## 2020-07-30 DIAGNOSIS — E875 Hyperkalemia: Secondary | ICD-10-CM | POA: Diagnosis not present

## 2020-07-30 DIAGNOSIS — I214 Non-ST elevation (NSTEMI) myocardial infarction: Secondary | ICD-10-CM | POA: Diagnosis not present

## 2020-07-30 DIAGNOSIS — Z20822 Contact with and (suspected) exposure to covid-19: Secondary | ICD-10-CM | POA: Insufficient documentation

## 2020-07-30 DIAGNOSIS — Z01812 Encounter for preprocedural laboratory examination: Secondary | ICD-10-CM | POA: Insufficient documentation

## 2020-07-30 DIAGNOSIS — I10 Essential (primary) hypertension: Secondary | ICD-10-CM | POA: Diagnosis not present

## 2020-07-30 DIAGNOSIS — E1165 Type 2 diabetes mellitus with hyperglycemia: Secondary | ICD-10-CM | POA: Diagnosis not present

## 2020-07-30 LAB — SARS CORONAVIRUS 2 (TAT 6-24 HRS): SARS Coronavirus 2: NEGATIVE

## 2020-08-02 ENCOUNTER — Other Ambulatory Visit: Payer: Self-pay

## 2020-08-02 ENCOUNTER — Encounter (HOSPITAL_COMMUNITY)
Admission: RE | Disposition: A | Discharge status: Home / Self Care | Payer: Self-pay | Source: Home / Self Care | Attending: Vascular Surgery

## 2020-08-02 ENCOUNTER — Ambulatory Visit (HOSPITAL_COMMUNITY)
Admission: RE | Admit: 2020-08-02 | Discharge: 2020-08-02 | Disposition: A | Discharge status: Home / Self Care | Payer: Medicare HMO | Attending: Vascular Surgery | Admitting: Vascular Surgery

## 2020-08-02 DIAGNOSIS — I120 Hypertensive chronic kidney disease with stage 5 chronic kidney disease or end stage renal disease: Secondary | ICD-10-CM | POA: Insufficient documentation

## 2020-08-02 DIAGNOSIS — N186 End stage renal disease: Secondary | ICD-10-CM | POA: Diagnosis not present

## 2020-08-02 DIAGNOSIS — Z87891 Personal history of nicotine dependence: Secondary | ICD-10-CM | POA: Insufficient documentation

## 2020-08-02 DIAGNOSIS — Z7982 Long term (current) use of aspirin: Secondary | ICD-10-CM | POA: Insufficient documentation

## 2020-08-02 DIAGNOSIS — N185 Chronic kidney disease, stage 5: Secondary | ICD-10-CM | POA: Diagnosis not present

## 2020-08-02 DIAGNOSIS — Z79899 Other long term (current) drug therapy: Secondary | ICD-10-CM | POA: Insufficient documentation

## 2020-08-02 DIAGNOSIS — Z794 Long term (current) use of insulin: Secondary | ICD-10-CM | POA: Insufficient documentation

## 2020-08-02 DIAGNOSIS — Z880 Allergy status to penicillin: Secondary | ICD-10-CM | POA: Insufficient documentation

## 2020-08-02 DIAGNOSIS — E1122 Type 2 diabetes mellitus with diabetic chronic kidney disease: Secondary | ICD-10-CM | POA: Insufficient documentation

## 2020-08-02 DIAGNOSIS — I771 Stricture of artery: Secondary | ICD-10-CM | POA: Insufficient documentation

## 2020-08-02 DIAGNOSIS — Z992 Dependence on renal dialysis: Secondary | ICD-10-CM | POA: Diagnosis not present

## 2020-08-02 DIAGNOSIS — M79642 Pain in left hand: Secondary | ICD-10-CM | POA: Diagnosis not present

## 2020-08-02 HISTORY — PX: PERIPHERAL VASCULAR INTERVENTION: CATH118257

## 2020-08-02 HISTORY — PX: AORTIC ARCH ANGIOGRAPHY: CATH118224

## 2020-08-02 LAB — POCT I-STAT, CHEM 8
BUN: 44 mg/dL — ABNORMAL HIGH (ref 8–23)
Calcium, Ion: 1.12 mmol/L — ABNORMAL LOW (ref 1.15–1.40)
Chloride: 94 mmol/L — ABNORMAL LOW (ref 98–111)
Creatinine, Ser: 5.7 mg/dL — ABNORMAL HIGH (ref 0.44–1.00)
Glucose, Bld: 115 mg/dL — ABNORMAL HIGH (ref 70–99)
HCT: 36 % (ref 36.0–46.0)
Hemoglobin: 12.2 g/dL (ref 12.0–15.0)
Potassium: 4.1 mmol/L (ref 3.5–5.1)
Sodium: 138 mmol/L (ref 135–145)
TCO2: 33 mmol/L — ABNORMAL HIGH (ref 22–32)

## 2020-08-02 LAB — GLUCOSE, CAPILLARY: Glucose-Capillary: 123 mg/dL — ABNORMAL HIGH (ref 70–99)

## 2020-08-02 SURGERY — AORTIC ARCH ANGIOGRAPHY
Anesthesia: LOCAL

## 2020-08-02 MED ORDER — SODIUM CHLORIDE 0.9% FLUSH: 3.0000 mL | INTRAVENOUS | Status: DC | PRN

## 2020-08-02 MED ORDER — SODIUM CHLORIDE 0.9% FLUSH: 3.0000 mL | Freq: Two times a day (BID) | INTRAVENOUS | Status: DC

## 2020-08-02 MED ORDER — MIDAZOLAM HCL 2 MG/2ML IJ SOLN
INTRAMUSCULAR | Status: AC
  Filled 2020-08-02: qty 2

## 2020-08-02 MED ORDER — LABETALOL HCL 5 MG/ML IV SOLN: 10.0000 mg | INTRAVENOUS | Status: DC | PRN

## 2020-08-02 MED ORDER — IODIXANOL 320 MG/ML IV SOLN
INTRAVENOUS | Status: DC | PRN
  Administered 2020-08-02: 110 mL via INTRA_ARTERIAL

## 2020-08-02 MED ORDER — HEPARIN SODIUM (PORCINE) 1000 UNIT/ML IJ SOLN
INTRAMUSCULAR | Status: AC
  Filled 2020-08-02: qty 1

## 2020-08-02 MED ORDER — SODIUM CHLORIDE 0.9 % IV SOLN: 250.0000 mL | INTRAVENOUS | Status: DC | PRN

## 2020-08-02 MED ORDER — MIDAZOLAM HCL 2 MG/2ML IJ SOLN
INTRAMUSCULAR | Status: DC | PRN
  Administered 2020-08-02: 1 mg via INTRAVENOUS

## 2020-08-02 MED ORDER — LIDOCAINE HCL (PF) 1 % IJ SOLN
INTRAMUSCULAR | Status: AC
  Filled 2020-08-02: qty 30

## 2020-08-02 MED ORDER — FENTANYL CITRATE (PF) 100 MCG/2ML IJ SOLN
INTRAMUSCULAR | Status: DC | PRN
  Administered 2020-08-02 (×2): 50 ug via INTRAVENOUS

## 2020-08-02 MED ORDER — HEPARIN (PORCINE) IN NACL 1000-0.9 UT/500ML-% IV SOLN
INTRAVENOUS | Status: DC | PRN
  Administered 2020-08-02: 500 mL

## 2020-08-02 MED ORDER — LIDOCAINE HCL (PF) 1 % IJ SOLN
INTRAMUSCULAR | Status: DC | PRN
  Administered 2020-08-02: 15 mL

## 2020-08-02 MED ORDER — ACETAMINOPHEN 325 MG PO TABS: 650.0000 mg | ORAL_TABLET | ORAL | Status: DC | PRN

## 2020-08-02 MED ORDER — FENTANYL CITRATE (PF) 100 MCG/2ML IJ SOLN
INTRAMUSCULAR | Status: AC
  Filled 2020-08-02: qty 2

## 2020-08-02 MED ORDER — ONDANSETRON HCL 4 MG/2ML IJ SOLN: 4.0000 mg | Freq: Four times a day (QID) | INTRAMUSCULAR | Status: DC | PRN

## 2020-08-02 MED ORDER — HYDRALAZINE HCL 20 MG/ML IJ SOLN: 5.0000 mg | INTRAMUSCULAR | Status: DC | PRN

## 2020-08-02 MED ORDER — HEPARIN SODIUM (PORCINE) 1000 UNIT/ML IJ SOLN
INTRAMUSCULAR | Status: DC | PRN
  Administered 2020-08-02: 3000 [IU] via INTRAVENOUS
  Administered 2020-08-02: 5000 [IU] via INTRAVENOUS

## 2020-08-02 MED ORDER — OXYCODONE HCL 5 MG PO TABS: 5.0000 mg | ORAL_TABLET | ORAL | Status: DC | PRN

## 2020-08-02 MED ORDER — HEPARIN (PORCINE) IN NACL 1000-0.9 UT/500ML-% IV SOLN
INTRAVENOUS | Status: AC
  Filled 2020-08-02: qty 1000

## 2020-08-02 SURGICAL SUPPLY — 21 items
BALLN IN.PACT DCB 5X80 (BALLOONS) ×3
BALLN MUSTANG 5X80X135 (BALLOONS) ×3
BALLOON MUSTANG 5X80X135 (BALLOONS) ×2 IMPLANT
CATH ANGIO 5F BER2 100CM (CATHETERS) ×3 IMPLANT
CATH ANGIO 5F PIGTAIL 100CM (CATHETERS) ×3 IMPLANT
CATH QUICKCROSS .035X135CM (MICROCATHETER) ×3 IMPLANT
CLOSURE MYNX CONTROL 6F/7F (Vascular Products) ×3 IMPLANT
DCB IN.PACT 5X80 (BALLOONS) ×2 IMPLANT
KIT ENCORE 26 ADVANTAGE (KITS) ×6 IMPLANT
KIT MICROPUNCTURE NIT STIFF (SHEATH) ×3 IMPLANT
KIT PV (KITS) ×3 IMPLANT
SHEATH GUIDING CAROTID 6FRX90 (SHEATH) ×3 IMPLANT
SHEATH PINNACLE 5F 10CM (SHEATH) ×3 IMPLANT
SHEATH PINNACLE 6F 10CM (SHEATH) ×3 IMPLANT
SHEATH PROBE COVER 6X72 (BAG) ×6 IMPLANT
STENT OMNILINK ELITE 6X19X135 (Permanent Stent) ×3 IMPLANT
SYR MEDRAD MARK V 150ML (SYRINGE) ×3 IMPLANT
TRANSDUCER W/STOPCOCK (MISCELLANEOUS) ×3 IMPLANT
TRAY PV CATH (CUSTOM PROCEDURE TRAY) ×3 IMPLANT
WIRE BENTSON .035X145CM (WIRE) ×3 IMPLANT
WIRE HI TORQ VERSACORE J 260CM (WIRE) ×3 IMPLANT

## 2020-08-02 NOTE — Op Note (Signed)
Patient name: Donna Howe MRN: [id number] DOB: 11/13/1955 Sex: female  08/02/2020 Pre-operative Diagnosis: esrd, left hand pain Post-operative diagnosis:  Same Surgeon:  Eda Paschal. Donzetta Matters, MD Procedure Performed: 1.  US guided cannulation of right common femoral artery 2.  Arch aortogram with cannulation of left upper extremity brachial artery and left lower extremity angiogram 3.  Drug-coated balloon angioplasty left axillary artery with 5 mm in.pact Admiral 4.  Stent of proximal left subclavian artery with 6 x 19 mm Omnilink 5.  Moderate sedation with fentanyl and Versed for 71 minutes 6.  Mynx device closure right common femoral artery  Indications: 64 year old female status post left upper extremity fistula creation.  She now has left hand pain.  She does have a positive steal study also has subclavian artery stenosis.  She is indicated for angiogram possible invention.   Findings: The left clavian artery there was a proximal stenosis with a gradient of greater than 25 mmHg systolic.  The axillary artery had a gradient between 2 lesions of 70 mmHg this was down to 15 at completion and the subclavian artery had 0% residual gradient.  There did not appear to be steal from the cephalic vein fistula and contrasted flow past into the the ulnar and radial arteries.  There was an incomplete arch.  At completion there was a weakly palpable radial artery pulse without compression of the fistula.  Patient will follow up in a few weeks to evaluate her left hand.  If there is no improvement we will plan for ligation of the left arm AV fistula.   Procedure:  The patient was identified in the holding area and taken to room 8.  The patient was then placed supine on the table and prepped and draped in the usual sterile fashion.  Moderate sedation with fentanyl and Versed was administered monitored by a nurse throughout the course of the case.  A time out was called.  Ultrasound was used to evaluate  the right common femoral artery which was noted be patent and somewhat calcified.  The area was anesthetized 1% lidocaine cannulated with micropuncture needle followed the wire and sheath.  A Bentson wire was placed followed by 5 [place] sheath.  Pigtail catheter was placed into the aortic arch arch aortogram was performed.  We then administered 3000 use of heparin.  We selected the left subclavian artery placed a long wire into the brachial artery perform left upper extremity angiogram.  We then did pullback gradient we demonstrated 70 mm gradient across the 2 axillary lesions.  We elected to intervene.  We exchanged for a long 6 French sheath into the subclavian artery patient was then given additional 5000 units of heparin.  We then predilated the lesions with the same 5 x 8 mm balloon.  Completion demonstrated less than 20% residual stenosis we then used a drug-coated balloon inflated nominal pressure for 3 minutes again no residual stenosis.  We then did a pullback gradient across the subclavian artery which demonstrated approximately a 25 mmHg gradient.  We then had to recannulate this placed the wire back into the brachial artery.  We then primarily stented with a noncovered 6 x 19 mm balloon expandable stent.  Completion demonstrated no residual gradient.  We exchanged for short 6 French sheath deployed a minx device in the right common femoral artery.  She tolerated procedure without any complication.   Contrast: 110cc   Shanara Schnieders C. Donzetta Matters, MD [facility] [place] Office: [phone] Pager:  [phone]    

## 2020-08-02 NOTE — Interval H&P Note (Signed)
History and Physical Interval Note:  08/02/2020 10:35 AM  Donna Howe  has presented today for surgery, with the diagnosis of left subclavian artery stenosis.  The various methods of treatment have been discussed with the patient and family. After consideration of risks, benefits and other options for treatment, the patient has consented to  Procedure(s): AORTIC ARCH ANGIOGRAPHY (N/A) as a surgical intervention.  The patient's history has been reviewed, patient examined, no change in status, stable for surgery.  I have reviewed the patient's chart and labs.  Questions were answered to the patient's satisfaction.     Servando Snare

## 2020-08-02 NOTE — Discharge Instructions (Signed)
Angiogram, Care After This sheet gives you information about how to care for yourself after your procedure. Your health care provider may also give you more specific instructions. If you have problems or questions, contact your health care provider. What can I expect after the procedure? After the procedure, it is common to have bruising and tenderness at the catheter insertion area. Follow these instructions at home: Insertion site care  Follow instructions from your health care provider about how to take care of your insertion site. Make sure you: ? Wash your hands with soap and water before you change your bandage (dressing). If soap and water are not available, use hand sanitizer. ? Change your dressing as told by your health care provider. ? Leave stitches (sutures), skin glue, or adhesive strips in place. These skin closures may need to stay in place for 2 weeks or longer. If adhesive strip edges start to loosen and curl up, you may trim the loose edges. Do not remove adhesive strips completely unless your health care provider tells you to do that.  Do not take baths, swim, or use a hot tub until your health care provider approves.  You may shower 24-48 hours after the procedure or as told by your health care provider. ? Gently wash the site with plain soap and water. ? Pat the area dry with a clean towel. ? Do not rub the site. This may cause bleeding.  Do not apply powder or lotion to the site. Keep the site clean and dry.  Check your insertion site every day for signs of infection. Check for: ? Redness, swelling, or pain. ? Fluid or blood. ? Warmth. ? Pus or a bad smell. Activity  Rest as told by your health care provider, usually for 1-2 days.  Do not lift anything that is heavier than 10 lbs. (4.5 kg) or as told by your health care provider.  Do not drive for 24 hours if you were given a medicine to help you relax (sedative).  Do not drive or use heavy machinery while  taking prescription pain medicine. General instructions   Return to your normal activities as told by your health care provider, usually in about a week. Ask your health care provider what activities are safe for you.  If the catheter site starts bleeding, lie flat and put pressure on the site. If the bleeding does not stop, get help right away. This is a medical emergency.  Drink enough fluid to keep your urine clear or pale yellow. This helps flush the contrast dye from your body.  Take over-the-counter and prescription medicines only as told by your health care provider.  Keep all follow-up visits as told by your health care provider. This is important. Contact a health care provider if:  You have a fever or chills.  You have redness, swelling, or pain around your insertion site.  You have fluid or blood coming from your insertion site.  The insertion site feels warm to the touch.  You have pus or a bad smell coming from your insertion site.  You have bruising around the insertion site.  You notice blood collecting in the tissue around the catheter site (hematoma). The hematoma may be painful to the touch. Get help right away if:  You have severe pain at the catheter insertion area.  The catheter insertion area swells very fast.  The catheter insertion area is bleeding, and the bleeding does not stop when you hold steady pressure on the area.    The area near or just beyond the catheter insertion site becomes pale, cool, tingly, or numb. These symptoms may represent a serious problem that is an emergency. Do not wait to see if the symptoms will go away. Get medical help right away. Call your local emergency services (911 in the [place] Do not drive yourself to the hospital. Summary  After the procedure, it is common to have bruising and tenderness at the catheter insertion area.  After the procedure, it is important to rest and drink plenty of fluids.  Do not take baths,  swim, or use a hot tub until your health care provider says it is okay to do so. You may shower 24-48 hours after the procedure or as told by your health care provider.  If the catheter site starts bleeding, lie flat and put pressure on the site. If the bleeding does not stop, get help right away. This is a medical emergency. This information is not intended to replace advice given to you by your health care provider. Make sure you discuss any questions you have with your health care provider. Document Revised: 08/17/2017 Document Reviewed: 08/09/2016 Elsevier Patient Education  2020 Elsevier Inc.  

## 2020-08-03 ENCOUNTER — Encounter (HOSPITAL_COMMUNITY): Payer: Self-pay | Admitting: Vascular Surgery

## 2020-08-06 ENCOUNTER — Encounter (HOSPITAL_COMMUNITY): Payer: Medicare HMO

## 2020-08-07 DIAGNOSIS — D509 Iron deficiency anemia, unspecified: Secondary | ICD-10-CM | POA: Diagnosis not present

## 2020-08-07 DIAGNOSIS — N2581 Secondary hyperparathyroidism of renal origin: Secondary | ICD-10-CM | POA: Diagnosis not present

## 2020-08-07 DIAGNOSIS — Z992 Dependence on renal dialysis: Secondary | ICD-10-CM | POA: Diagnosis not present

## 2020-08-07 DIAGNOSIS — D689 Coagulation defect, unspecified: Secondary | ICD-10-CM | POA: Diagnosis not present

## 2020-08-07 DIAGNOSIS — N186 End stage renal disease: Secondary | ICD-10-CM | POA: Diagnosis not present

## 2020-08-07 DIAGNOSIS — Z23 Encounter for immunization: Secondary | ICD-10-CM | POA: Diagnosis not present

## 2020-08-07 DIAGNOSIS — E1122 Type 2 diabetes mellitus with diabetic chronic kidney disease: Secondary | ICD-10-CM | POA: Diagnosis not present

## 2020-08-07 DIAGNOSIS — D631 Anemia in chronic kidney disease: Secondary | ICD-10-CM | POA: Diagnosis not present

## 2020-08-10 ENCOUNTER — Encounter (HOSPITAL_COMMUNITY): Payer: Medicare HMO

## 2020-08-17 DIAGNOSIS — I10 Essential (primary) hypertension: Secondary | ICD-10-CM | POA: Diagnosis not present

## 2020-08-17 DIAGNOSIS — E1165 Type 2 diabetes mellitus with hyperglycemia: Secondary | ICD-10-CM | POA: Diagnosis not present

## 2020-08-17 DIAGNOSIS — N186 End stage renal disease: Secondary | ICD-10-CM | POA: Diagnosis not present

## 2020-08-17 DIAGNOSIS — Z794 Long term (current) use of insulin: Secondary | ICD-10-CM | POA: Diagnosis not present

## 2020-08-17 DIAGNOSIS — E1122 Type 2 diabetes mellitus with diabetic chronic kidney disease: Secondary | ICD-10-CM | POA: Diagnosis not present

## 2020-08-17 DIAGNOSIS — Z992 Dependence on renal dialysis: Secondary | ICD-10-CM | POA: Diagnosis not present

## 2020-08-18 DIAGNOSIS — Z23 Encounter for immunization: Secondary | ICD-10-CM | POA: Diagnosis not present

## 2020-08-18 DIAGNOSIS — Z992 Dependence on renal dialysis: Secondary | ICD-10-CM | POA: Diagnosis not present

## 2020-08-18 DIAGNOSIS — E1122 Type 2 diabetes mellitus with diabetic chronic kidney disease: Secondary | ICD-10-CM | POA: Diagnosis not present

## 2020-08-18 DIAGNOSIS — N186 End stage renal disease: Secondary | ICD-10-CM | POA: Diagnosis not present

## 2020-08-18 DIAGNOSIS — N2581 Secondary hyperparathyroidism of renal origin: Secondary | ICD-10-CM | POA: Diagnosis not present

## 2020-08-18 DIAGNOSIS — D631 Anemia in chronic kidney disease: Secondary | ICD-10-CM | POA: Diagnosis not present

## 2020-08-18 DIAGNOSIS — D509 Iron deficiency anemia, unspecified: Secondary | ICD-10-CM | POA: Diagnosis not present

## 2020-08-18 DIAGNOSIS — D689 Coagulation defect, unspecified: Secondary | ICD-10-CM | POA: Diagnosis not present

## 2020-08-19 DIAGNOSIS — D509 Iron deficiency anemia, unspecified: Secondary | ICD-10-CM | POA: Diagnosis not present

## 2020-08-19 DIAGNOSIS — E1122 Type 2 diabetes mellitus with diabetic chronic kidney disease: Secondary | ICD-10-CM | POA: Diagnosis not present

## 2020-08-19 DIAGNOSIS — D631 Anemia in chronic kidney disease: Secondary | ICD-10-CM | POA: Diagnosis not present

## 2020-08-19 DIAGNOSIS — Z992 Dependence on renal dialysis: Secondary | ICD-10-CM | POA: Diagnosis not present

## 2020-08-19 DIAGNOSIS — N2581 Secondary hyperparathyroidism of renal origin: Secondary | ICD-10-CM | POA: Diagnosis not present

## 2020-08-19 DIAGNOSIS — N186 End stage renal disease: Secondary | ICD-10-CM | POA: Diagnosis not present

## 2020-08-19 DIAGNOSIS — D689 Coagulation defect, unspecified: Secondary | ICD-10-CM | POA: Diagnosis not present

## 2020-08-19 DIAGNOSIS — Z23 Encounter for immunization: Secondary | ICD-10-CM | POA: Diagnosis not present

## 2020-08-20 ENCOUNTER — Other Ambulatory Visit: Payer: Self-pay

## 2020-08-20 DIAGNOSIS — N186 End stage renal disease: Secondary | ICD-10-CM

## 2020-08-23 DIAGNOSIS — E039 Hypothyroidism, unspecified: Secondary | ICD-10-CM | POA: Diagnosis not present

## 2020-08-23 DIAGNOSIS — Z20828 Contact with and (suspected) exposure to other viral communicable diseases: Secondary | ICD-10-CM | POA: Diagnosis not present

## 2020-08-27 ENCOUNTER — Other Ambulatory Visit: Payer: Self-pay

## 2020-08-27 ENCOUNTER — Other Ambulatory Visit: Payer: Self-pay | Admitting: Vascular Surgery

## 2020-08-27 ENCOUNTER — Encounter: Payer: Self-pay | Admitting: Vascular Surgery

## 2020-08-27 ENCOUNTER — Ambulatory Visit (HOSPITAL_COMMUNITY)
Admission: RE | Admit: 2020-08-27 | Discharge: 2020-08-27 | Disposition: A | Discharge status: Home / Self Care | Payer: Medicare HMO | Source: Ambulatory Visit | Attending: Vascular Surgery | Admitting: Vascular Surgery

## 2020-08-27 ENCOUNTER — Ambulatory Visit (INDEPENDENT_AMBULATORY_CARE_PROVIDER_SITE_OTHER): Payer: Self-pay | Admitting: Vascular Surgery

## 2020-08-27 VITALS — BP 124/76 | HR 83 | Temp 97.4°F | Resp 20 | Ht 65.0 in | Wt 200.0 lb

## 2020-08-27 DIAGNOSIS — N186 End stage renal disease: Secondary | ICD-10-CM

## 2020-08-27 DIAGNOSIS — Z992 Dependence on renal dialysis: Secondary | ICD-10-CM | POA: Diagnosis not present

## 2020-08-27 DIAGNOSIS — I739 Peripheral vascular disease, unspecified: Secondary | ICD-10-CM

## 2020-08-27 MED ORDER — OXYCODONE-ACETAMINOPHEN 5-325 MG PO TABS
1.0000 | ORAL_TABLET | ORAL | 0 refills | Status: DC | PRN
Start: 2020-08-27 — End: 2020-09-15

## 2020-08-27 NOTE — H&P (View-Only) (Signed)
Patient ID: Donna Howe, female   DOB: 01-Dec-1955, 64 y.o.   MRN: [id number]  Reason for Consult: Post-op Follow-up   Referred by Practice, [facility]  Subjective:     HPI:  Donna Howe is a 64 y.o. female had a left arm cephalic vein fistula created while she was an inpatient.  She is currently on dialysis via catheter.  They have never used the fistula.  Recently underwent stent of her left subclavian artery and drug-coated balloon angioplasty of her left axillary artery.  She has persistent constant left hand pain which is worse when she is on dialysis.  Past Medical History:  Diagnosis Date  . Anemia   . Asthma   . CAD (coronary artery disease)    Multivessel s/p CABG 2005, numerous PCIs since that time  . Carotid artery disease (HCC)    R CEA  . Chronic kidney disease (CKD), stage III (moderate) (HCC)   . Essential hypertension   . Gout   . Hyperlipidemia   . Hypothyroidism   . Myocardial infarction ([place])   . PAD (peripheral artery disease) ([place])    Dr. Kellie Simmering  . S/P angioplasty with stent- DES to [facility] and to [facility] to LAD with DES 04/09/18.   04/10/2018  . SBO (small bowel obstruction) ([place]) 2011   Status post lysis of adhesions & hernia repair  . Sinus bradycardia   . Thrombocytopenia, unspecified ([place])   . Type 2 diabetes mellitus ([place])   . Umbilical hernia    Family History  Problem Relation Age of Onset  . Diabetes Mother   . Heart disease Mother        before age 41  . Hyperlipidemia Mother   . Hypertension Mother   . Thyroid disease Father   . Hypertension Father   . AAA (abdominal aortic aneurysm) Father   . Heart disease Brother        before age 57  . Hypertension Brother   . Hyperlipidemia Son   . Hypertension Son    Past Surgical History:  Procedure Laterality Date  . AORTIC ARCH ANGIOGRAPHY N/A 08/02/2020   Procedure: AORTIC ARCH ANGIOGRAPHY;  Surgeon: Waynetta Sandy, MD;  Location: [place] [facility];  Service:  Cardiovascular;  Laterality: N/A;  Lt upper extermity  . AV FISTULA PLACEMENT Left 06/29/2020   Procedure: LEFT ARM ARTERIOVENOUS (AV) FISTULA;  Surgeon: Waynetta Sandy, MD;  Location: [place];  Service: Vascular;  Laterality: Left;  ARM  . CESAREAN SECTION  1984  . CHOLECYSTECTOMY  2010  . CORONARY ARTERY BYPASS GRAFT  2005  . CORONARY BALLOON ANGIOPLASTY N/A 05/31/2017   Procedure: CORONARY BALLOON ANGIOPLASTY;  Surgeon: Jettie Booze, MD;  Location: [place] [facility];  Service: Cardiovascular;  Laterality: N/A;  . CORONARY STENT INTERVENTION N/A 05/31/2017   Procedure: CORONARY STENT INTERVENTION;  Surgeon: Jettie Booze, MD;  Location: [place] [facility];  Service: Cardiovascular;  Laterality: N/A;  . CORONARY STENT INTERVENTION N/A 04/09/2018   Procedure: CORONARY STENT INTERVENTION;  Surgeon: Jettie Booze, MD;  Location: [place] [facility];  Service: Cardiovascular;  Laterality: N/A;  SVG RCA  . ENDARTERECTOMY Right 04/18/2013   Procedure: ENDARTERECTOMY CAROTID;  Surgeon: Mal Misty, MD;  Location: [place];  Service: Vascular;  Laterality: Right;  . HERNIA REPAIR  1989  . Incisional hernia repair x2  03/04/2010   Laparoscopic with 35cm mesh by Dr Ronnald Collum  . IR FLUORO GUIDE CV LINE RIGHT  06/21/2020  . IR US GUIDE VASC ACCESS RIGHT  06/21/2020  . LEFT HEART CATH AND CORS/GRAFTS ANGIOGRAPHY N/A 05/31/2017   Procedure: LEFT HEART CATH AND CORS/GRAFTS ANGIOGRAPHY;  Surgeon: Jettie Booze, MD;  Location: [place] [facility];  Service: Cardiovascular;  Laterality: N/A;  . LEFT HEART CATH AND CORS/GRAFTS ANGIOGRAPHY N/A 04/08/2018   Procedure: LEFT HEART CATH AND CORS/GRAFTS ANGIOGRAPHY;  Surgeon: Jettie Booze, MD;  Location: [place] [facility];  Service: Cardiovascular;  Laterality: N/A;  . LEFT HEART CATH AND CORS/GRAFTS ANGIOGRAPHY N/A 06/22/2020   Procedure: LEFT HEART CATH AND CORS/GRAFTS ANGIOGRAPHY;  Surgeon: Belva Crome, MD;  Location: [place] [facility];  Service: Cardiovascular;  Laterality: N/A;  . LEFT HEART CATHETERIZATION WITH CORONARY ANGIOGRAM N/A 12/19/2012   Procedure: LEFT HEART CATHETERIZATION WITH CORONARY ANGIOGRAM;  Surgeon: Josue Hector, MD;  Location: [facility];  Service: Cardiovascular;  Laterality: N/A;  . LEFT HEART CATHETERIZATION WITH CORONARY/GRAFT ANGIOGRAM N/A 04/19/2013   Procedure: LEFT HEART CATHETERIZATION WITH Beatrix Fetters;  Surgeon: Lorretta Harp, MD;  Location: [facility] CATH LAB;  Service: Cardiovascular;  Laterality: N/A;  . PATCH ANGIOPLASTY Right 04/18/2013   Procedure: PATCH ANGIOPLASTY Right Internal Carotid Artery;  Surgeon: Mal Misty, MD;  Location: [place];  Service: Vascular;  Laterality: Right;  . PERCUTANEOUS CORONARY STENT INTERVENTION (PCI-S) Right 12/19/2012   Procedure: PERCUTANEOUS CORONARY STENT INTERVENTION (PCI-S);  Surgeon: Josue Hector, MD;  Location: [facility] CATH LAB;  Service: Cardiovascular;  Laterality: Right;  . PERIPHERAL VASCULAR INTERVENTION Left 08/02/2020   Procedure: PERIPHERAL VASCULAR INTERVENTION;  Surgeon: Waynetta Sandy, MD;  Location: [place] [facility];  Service: Cardiovascular;  Laterality: Left;  Left subclavian  . SHOULDER SURGERY      Short Social History:  Social History   Tobacco Use  . Smoking status: Former Smoker    Packs/day: 1.00    Years: 20.00    Pack years: 20.00    Types: Cigarettes    Quit date: 12/10/2012    Years since quitting: 7.7  . Smokeless tobacco: Never Used  Substance Use Topics  . Alcohol use: No    Alcohol/week: 0.0 standard drinks    Allergies  Allergen Reactions  . Penicillins Other (See Comments)    REACTION: Unknown, told as a child Has patient had a PCN reaction causing immediate rash, facial/tongue/throat swelling, SOB or lightheadedness with hypotension: Unknown Has patient had a PCN reaction causing severe rash involving mucus membranes or skin necrosis: Unknown Has patient had a PCN reaction  that required hospitalization: Unknown Has patient had a PCN reaction occurring within the last 10 years: No If all of the above answers are "NO", then may proceed with Cephalosporin use.     Current Outpatient Medications  Medication Sig Dispense Refill  . acetaminophen (TYLENOL) 325 MG tablet Take 2 tablets (650 mg total) by mouth every 6 (six) hours as needed for mild pain or moderate pain (or Fever >/= 101).    . Albuterol Sulfate 108 (90 Base) MCG/ACT AEPB Inhale 2 puffs into the lungs every 4 (four) hours as needed (Shortness of breath).     Marland Kitchen allopurinol (ZYLOPRIM) 100 MG tablet Take one tablet after hemodialysis, on Tuesday, Thursday and Saturday. (Patient taking differently: Take 100 mg by mouth Every Tuesday,Thursday,and Saturday with dialysis. After dialysis) 30 tablet 11  . ALPRAZolam (XANAX) 0.5 MG tablet Take 0.25-0.5 mg by mouth See admin instructions. Take 0.5 tablet (0.25 mg) by mouth in the morning,  take 0.5 tablet (0.25 mg) by mouth in the afternoon, & take 1 tablet (0.5 mg) by mouth at night.    Marland Kitchen aspirin EC 81 MG tablet Take 81 mg by mouth daily.     Marland Kitchen atorvastatin (LIPITOR) 80 MG tablet Take 1 tablet (80 mg total) by mouth every evening. (Patient taking differently: Take 80 mg by mouth at bedtime.)    . AURYXIA 1 GM 210 MG(Fe) tablet Take 210 mg by mouth 3 (three) times daily.    . clopidogrel (PLAVIX) 75 MG tablet Take 1 tablet (75 mg total) by mouth daily. 90 tablet 3  . diclofenac Sodium (VOLTAREN) 1 % GEL Apply 2 g topically 4 (four) times daily as needed (apply to right foot big toe as needed for pain.). 50 g 0  . gabapentin (NEURONTIN) 400 MG capsule Take 1 capsule (400 mg total) by mouth 2 (two) times daily.    Marland Kitchen ipratropium (ATROVENT HFA) 17 MCG/ACT inhaler Inhale 2 puffs into the lungs every 4 (four) hours as needed for wheezing.     Marland Kitchen levothyroxine (SYNTHROID, LEVOTHROID) 125 MCG tablet Take 125 mcg by mouth daily before breakfast.     . nitroGLYCERIN (NITROSTAT)  0.4 MG SL tablet Place 0.4 mg under the tongue every 5 (five) minutes x 3 doses as needed for chest pain. Not to exceed 3 in 15 minute time frame    . NOVOLIN 70/30 FLEXPEN (70-30) 100 UNIT/ML KwikPen Inject 24 Units into the skin in the morning and at bedtime.    . ondansetron (ZOFRAN) 8 MG tablet Take 8 mg by mouth every 8 (eight) hours as needed for nausea.    . pantoprazole (PROTONIX) 40 MG tablet Take 1 tablet (40 mg total) by mouth 2 (two) times daily. 60 tablet 1  . ranolazine (RANEXA) 500 MG 12 hr tablet Take 1 tablet (500 mg total) by mouth 2 (two) times daily. 60 tablet 1  . Vitamin D, Ergocalciferol, (DRISDOL) 1.25 MG (50000 UNIT) CAPS capsule Take 50,000 Units by mouth every Sunday.     Marland Kitchen oxyCODONE (OXY IR/ROXICODONE) 5 MG immediate release tablet Take 1 tablet (5 mg total) by mouth every 4 (four) hours as needed for severe pain. (Patient not taking: Reported on 08/27/2020) 10 tablet 0  . oxyCODONE-acetaminophen (PERCOCET/ROXICET) 5-325 MG tablet Take 1 tablet by mouth every 4 (four) hours as needed for severe pain. 10 tablet 0   No current facility-administered medications for this visit.    Review of Systems  Constitutional:  Constitutional negative. HENT: HENT negative.  Eyes: Eyes negative.  Respiratory: Respiratory negative.  GI: Gastrointestinal negative.  Musculoskeletal:       Left hand pain Skin: Skin negative.  Neurological: Positive for numbness.  Hematologic: Hematologic/lymphatic negative.  Psychiatric: Psychiatric negative.        Objective:  Objective   Vitals:   08/27/20 1435  BP: 124/76  Pulse: 83  Resp: 20  Temp: (!) 97.4 F (36.3 C)  SpO2: 96%  Weight: 200 lb (90.7 kg)  Height: 5\' 5"  (1.651 m)   Body mass index is 33.28 kg/m.  Physical Exam HENT:     Head: Normocephalic.     Nose:     Comments: Wearing a mask Eyes:     Pupils: Pupils are equal, round, and reactive to light.  Cardiovascular:     Comments: Palpable radial artery pulse  with compression of her fistula Pulmonary:     Effort: Pulmonary effort is normal.  Abdominal:  General: Abdomen is flat.  Musculoskeletal:     Comments: No tissue loss or ulceration  Skin:    General: Skin is warm.     Capillary Refill: Capillary refill takes less than 2 seconds.  Neurological:     Mental Status: She is alert.  Psychiatric:        Mood and Affect: Mood normal.        Behavior: Behavior normal.        Thought Content: Thought content normal.        Judgment: Judgment normal.     Data: Diagnosis: AVF steal   Impression: Left radial artery pressures without AVF compression: 63mmHg  Left radial artery pressure with AVF compression: 108 mmHg.      Assessment/Plan:     64 year old female with left upper extremity steal syndrome with significant hand pain despite treatment of left subclavian and left axillary artery stenoses.  Given that the fistula has never been used I have recommended ligation.  She will continue to use her catheter.  We will need to find permanent access for her once she has healed.     Waynetta Sandy MD [facility]

## 2020-08-27 NOTE — Progress Notes (Signed)
Patient ID: Donna Howe, female   DOB: [birth year], 64 y.o.   MRN: [id number]  Reason for Consult: Post-op Follow-up   Referred by Practice, [facility]  Subjective:     HPI:  Donna Howe is a 64 y.o. female had a left arm cephalic vein fistula created while she was an inpatient.  She is currently on dialysis via catheter.  They have never used the fistula.  Recently underwent stent of her left subclavian artery and drug-coated balloon angioplasty of her left axillary artery.  She has persistent constant left hand pain which is worse when she is on dialysis.  Past Medical History:  Diagnosis Date  . Anemia   . Asthma   . CAD (coronary artery disease)    Multivessel s/p CABG 2005, numerous PCIs since that time  . Carotid artery disease (HCC)    R CEA  . Chronic kidney disease (CKD), stage III (moderate) (HCC)   . Essential hypertension   . Gout   . Hyperlipidemia   . Hypothyroidism   . Myocardial infarction ([place])   . PAD (peripheral artery disease) ([place])    Dr. Kellie Simmering  . S/P angioplasty with stent- DES to [facility] and to [facility] to LAD with DES 04/09/18.   04/10/2018  . SBO (small bowel obstruction) ([place]) 2011   Status post lysis of adhesions & hernia repair  . Sinus bradycardia   . Thrombocytopenia, unspecified ([place])   . Type 2 diabetes mellitus ([place])   . Umbilical hernia    Family History  Problem Relation Age of Onset  . Diabetes Mother   . Heart disease Mother        before age 72  . Hyperlipidemia Mother   . Hypertension Mother   . Thyroid disease Father   . Hypertension Father   . AAA (abdominal aortic aneurysm) Father   . Heart disease Brother        before age 70  . Hypertension Brother   . Hyperlipidemia Son   . Hypertension Son    Past Surgical History:  Procedure Laterality Date  . AORTIC ARCH ANGIOGRAPHY N/A 08/02/2020   Procedure: AORTIC ARCH ANGIOGRAPHY;  Surgeon: Waynetta Sandy, MD;  Location: [place] [facility];  Service:  Cardiovascular;  Laterality: N/A;  Lt upper extermity  . AV FISTULA PLACEMENT Left 06/29/2020   Procedure: LEFT ARM ARTERIOVENOUS (AV) FISTULA;  Surgeon: Waynetta Sandy, MD;  Location: [place];  Service: Vascular;  Laterality: Left;  ARM  . CESAREAN SECTION  1984  . CHOLECYSTECTOMY  2010  . CORONARY ARTERY BYPASS GRAFT  2005  . CORONARY BALLOON ANGIOPLASTY N/A 05/31/2017   Procedure: CORONARY BALLOON ANGIOPLASTY;  Surgeon: Jettie Booze, MD;  Location: [place] [facility];  Service: Cardiovascular;  Laterality: N/A;  . CORONARY STENT INTERVENTION N/A 05/31/2017   Procedure: CORONARY STENT INTERVENTION;  Surgeon: Jettie Booze, MD;  Location: [place] [facility];  Service: Cardiovascular;  Laterality: N/A;  . CORONARY STENT INTERVENTION N/A 04/09/2018   Procedure: CORONARY STENT INTERVENTION;  Surgeon: Jettie Booze, MD;  Location: [place] [facility];  Service: Cardiovascular;  Laterality: N/A;  SVG RCA  . ENDARTERECTOMY Right 04/18/2013   Procedure: ENDARTERECTOMY CAROTID;  Surgeon: Mal Misty, MD;  Location: [place];  Service: Vascular;  Laterality: Right;  . HERNIA REPAIR  1989  . Incisional hernia repair x2  03/04/2010   Laparoscopic with 35cm mesh by Dr Ronnald Collum  . IR FLUORO GUIDE CV LINE RIGHT  06/21/2020  . IR US GUIDE VASC ACCESS RIGHT  06/21/2020  . LEFT HEART CATH AND CORS/GRAFTS ANGIOGRAPHY N/A 05/31/2017   Procedure: LEFT HEART CATH AND CORS/GRAFTS ANGIOGRAPHY;  Surgeon: Jettie Booze, MD;  Location: [place] [facility];  Service: Cardiovascular;  Laterality: N/A;  . LEFT HEART CATH AND CORS/GRAFTS ANGIOGRAPHY N/A 04/08/2018   Procedure: LEFT HEART CATH AND CORS/GRAFTS ANGIOGRAPHY;  Surgeon: Jettie Booze, MD;  Location: [place] [facility];  Service: Cardiovascular;  Laterality: N/A;  . LEFT HEART CATH AND CORS/GRAFTS ANGIOGRAPHY N/A 06/22/2020   Procedure: LEFT HEART CATH AND CORS/GRAFTS ANGIOGRAPHY;  Surgeon: Belva Crome, MD;  Location: [place] [facility];  Service: Cardiovascular;  Laterality: N/A;  . LEFT HEART CATHETERIZATION WITH CORONARY ANGIOGRAM N/A 12/19/2012   Procedure: LEFT HEART CATHETERIZATION WITH CORONARY ANGIOGRAM;  Surgeon: Josue Hector, MD;  Location: [facility];  Service: Cardiovascular;  Laterality: N/A;  . LEFT HEART CATHETERIZATION WITH CORONARY/GRAFT ANGIOGRAM N/A 04/19/2013   Procedure: LEFT HEART CATHETERIZATION WITH Beatrix Fetters;  Surgeon: Lorretta Harp, MD;  Location: [facility] CATH LAB;  Service: Cardiovascular;  Laterality: N/A;  . PATCH ANGIOPLASTY Right 04/18/2013   Procedure: PATCH ANGIOPLASTY Right Internal Carotid Artery;  Surgeon: Mal Misty, MD;  Location: [place];  Service: Vascular;  Laterality: Right;  . PERCUTANEOUS CORONARY STENT INTERVENTION (PCI-S) Right 12/19/2012   Procedure: PERCUTANEOUS CORONARY STENT INTERVENTION (PCI-S);  Surgeon: Josue Hector, MD;  Location: [facility] CATH LAB;  Service: Cardiovascular;  Laterality: Right;  . PERIPHERAL VASCULAR INTERVENTION Left 08/02/2020   Procedure: PERIPHERAL VASCULAR INTERVENTION;  Surgeon: Waynetta Sandy, MD;  Location: [place] [facility];  Service: Cardiovascular;  Laterality: Left;  Left subclavian  . SHOULDER SURGERY      Short Social History:  Social History   Tobacco Use  . Smoking status: Former Smoker    Packs/day: 1.00    Years: 20.00    Pack years: 20.00    Types: Cigarettes    Quit date: 12/10/2012    Years since quitting: 7.7  . Smokeless tobacco: Never Used  Substance Use Topics  . Alcohol use: No    Alcohol/week: 0.0 standard drinks    Allergies  Allergen Reactions  . Penicillins Other (See Comments)    REACTION: Unknown, told as a child Has patient had a PCN reaction causing immediate rash, facial/tongue/throat swelling, SOB or lightheadedness with hypotension: Unknown Has patient had a PCN reaction causing severe rash involving mucus membranes or skin necrosis: Unknown Has patient had a PCN reaction  that required hospitalization: Unknown Has patient had a PCN reaction occurring within the last 10 years: No If all of the above answers are "NO", then may proceed with Cephalosporin use.     Current Outpatient Medications  Medication Sig Dispense Refill  . acetaminophen (TYLENOL) 325 MG tablet Take 2 tablets (650 mg total) by mouth every 6 (six) hours as needed for mild pain or moderate pain (or Fever >/= 101).    . Albuterol Sulfate 108 (90 Base) MCG/ACT AEPB Inhale 2 puffs into the lungs every 4 (four) hours as needed (Shortness of breath).     Marland Kitchen allopurinol (ZYLOPRIM) 100 MG tablet Take one tablet after hemodialysis, on Tuesday, Thursday and Saturday. (Patient taking differently: Take 100 mg by mouth Every Tuesday,Thursday,and Saturday with dialysis. After dialysis) 30 tablet 11  . ALPRAZolam (XANAX) 0.5 MG tablet Take 0.25-0.5 mg by mouth See admin instructions. Take 0.5 tablet (0.25 mg) by mouth in the morning,  take 0.5 tablet (0.25 mg) by mouth in the afternoon, & take 1 tablet (0.5 mg) by mouth at night.    Marland Kitchen aspirin EC 81 MG tablet Take 81 mg by mouth daily.     Marland Kitchen atorvastatin (LIPITOR) 80 MG tablet Take 1 tablet (80 mg total) by mouth every evening. (Patient taking differently: Take 80 mg by mouth at bedtime.)    . AURYXIA 1 GM 210 MG(Fe) tablet Take 210 mg by mouth 3 (three) times daily.    . clopidogrel (PLAVIX) 75 MG tablet Take 1 tablet (75 mg total) by mouth daily. 90 tablet 3  . diclofenac Sodium (VOLTAREN) 1 % GEL Apply 2 g topically 4 (four) times daily as needed (apply to right foot big toe as needed for pain.). 50 g 0  . gabapentin (NEURONTIN) 400 MG capsule Take 1 capsule (400 mg total) by mouth 2 (two) times daily.    Marland Kitchen ipratropium (ATROVENT HFA) 17 MCG/ACT inhaler Inhale 2 puffs into the lungs every 4 (four) hours as needed for wheezing.     Marland Kitchen levothyroxine (SYNTHROID, LEVOTHROID) 125 MCG tablet Take 125 mcg by mouth daily before breakfast.     . nitroGLYCERIN (NITROSTAT)  0.4 MG SL tablet Place 0.4 mg under the tongue every 5 (five) minutes x 3 doses as needed for chest pain. Not to exceed 3 in 15 minute time frame    . NOVOLIN 70/30 FLEXPEN (70-30) 100 UNIT/ML KwikPen Inject 24 Units into the skin in the morning and at bedtime.    . ondansetron (ZOFRAN) 8 MG tablet Take 8 mg by mouth every 8 (eight) hours as needed for nausea.    . pantoprazole (PROTONIX) 40 MG tablet Take 1 tablet (40 mg total) by mouth 2 (two) times daily. 60 tablet 1  . ranolazine (RANEXA) 500 MG 12 hr tablet Take 1 tablet (500 mg total) by mouth 2 (two) times daily. 60 tablet 1  . Vitamin D, Ergocalciferol, (DRISDOL) 1.25 MG (50000 UNIT) CAPS capsule Take 50,000 Units by mouth every Sunday.     Marland Kitchen oxyCODONE (OXY IR/ROXICODONE) 5 MG immediate release tablet Take 1 tablet (5 mg total) by mouth every 4 (four) hours as needed for severe pain. (Patient not taking: Reported on 08/27/2020) 10 tablet 0  . oxyCODONE-acetaminophen (PERCOCET/ROXICET) 5-325 MG tablet Take 1 tablet by mouth every 4 (four) hours as needed for severe pain. 10 tablet 0   No current facility-administered medications for this visit.    Review of Systems  Constitutional:  Constitutional negative. HENT: HENT negative.  Eyes: Eyes negative.  Respiratory: Respiratory negative.  GI: Gastrointestinal negative.  Musculoskeletal:       Left hand pain Skin: Skin negative.  Neurological: Positive for numbness.  Hematologic: Hematologic/lymphatic negative.  Psychiatric: Psychiatric negative.        Objective:  Objective   Vitals:   08/27/20 1435  BP: 124/76  Pulse: 83  Resp: 20  Temp: (!) 97.4 F (36.3 C)  SpO2: 96%  Weight: 200 lb (90.7 kg)  Height: 5\' 5"  (1.651 m)   Body mass index is 33.28 kg/m.  Physical Exam HENT:     Head: Normocephalic.     Nose:     Comments: Wearing a mask Eyes:     Pupils: Pupils are equal, round, and reactive to light.  Cardiovascular:     Comments: Palpable radial artery pulse  with compression of her fistula Pulmonary:     Effort: Pulmonary effort is normal.  Abdominal:  General: Abdomen is flat.  Musculoskeletal:     Comments: No tissue loss or ulceration  Skin:    General: Skin is warm.     Capillary Refill: Capillary refill takes less than 2 seconds.  Neurological:     Mental Status: She is alert.  Psychiatric:        Mood and Affect: Mood normal.        Behavior: Behavior normal.        Thought Content: Thought content normal.        Judgment: Judgment normal.     Data: Diagnosis: AVF steal   Impression: Left radial artery pressures without AVF compression: 71mmHg  Left radial artery pressure with AVF compression: 108 mmHg.      Assessment/Plan:     64 year old female with left upper extremity steal syndrome with significant hand pain despite treatment of left subclavian and left axillary artery stenoses.  Given that the fistula has never been used I have recommended ligation.  She will continue to use her catheter.  We will need to find permanent access for her once she has healed.     Waynetta Sandy MD [facility]

## 2020-09-03 ENCOUNTER — Other Ambulatory Visit: Payer: Self-pay | Admitting: Internal Medicine

## 2020-09-03 ENCOUNTER — Other Ambulatory Visit (HOSPITAL_COMMUNITY): Payer: Medicare HMO

## 2020-09-03 DIAGNOSIS — R058 Other specified cough: Secondary | ICD-10-CM

## 2020-09-13 ENCOUNTER — Other Ambulatory Visit (HOSPITAL_COMMUNITY)
Admission: RE | Admit: 2020-09-13 | Discharge: 2020-09-13 | Disposition: A | Discharge status: Home / Self Care | Payer: Medicare HMO | Source: Ambulatory Visit | Attending: Vascular Surgery | Admitting: Vascular Surgery

## 2020-09-13 ENCOUNTER — Other Ambulatory Visit: Payer: Self-pay

## 2020-09-13 ENCOUNTER — Encounter (HOSPITAL_COMMUNITY): Payer: Self-pay | Admitting: Vascular Surgery

## 2020-09-13 DIAGNOSIS — Z01812 Encounter for preprocedural laboratory examination: Secondary | ICD-10-CM | POA: Diagnosis not present

## 2020-09-13 DIAGNOSIS — Z20822 Contact with and (suspected) exposure to covid-19: Secondary | ICD-10-CM | POA: Diagnosis not present

## 2020-09-13 LAB — SARS CORONAVIRUS 2 (TAT 6-24 HRS): SARS Coronavirus 2: NEGATIVE

## 2020-09-13 NOTE — Progress Notes (Signed)
Donna Howe denies chest pain or shortness of breath.  Patient denies symptoms of Covid and has not been in with anyone who has to her knowledge. Donna Howe will be tested today.  Donna Howe has type II, patient reports that CBG run 110- 130, last A1C was 6.5 - per patient. I instructed Donna Howe to take 17 units of Novolin 70/30 = 70 % tonight, do not take any 70/30 Insulin in am. I instructed patient to check CBG after awaking and every 2 hours until arrival  to the hospital.  I Instructed patient if CBG is less than 70 to drink 1/2 cup of a clear juice. Recheck CBG in 15 minutes then call pre- op desk at [phone] for further instructions.

## 2020-09-13 NOTE — Progress Notes (Signed)
Anesthesia Chart Review:  Case: [id number] Date/Time: 09/15/20 1045   Procedure: LIGATION OF LEFT ARM ARTERIOVENOUS  FISTULA (Left )   Anesthesia type: Choice   Pre-op diagnosis: LEFT ARM STEAL SYNDROME   Location: [facility] OR ROOM 16 / [place] [facility]   Surgeons: Waynetta Sandy, MD      DISCUSSION: Patient is a 64 year old female scheduled for the above procedure.   History includes former smoker (quit 12/10/12), CAD (s/p CABG 11/16/03: LIMA-LAD, SVG-OM-LPDA, SVG-RCA; PTCA SVG-OM1 5/0/05; DES LM/pLCX & PTCA oLAD & pOM1 03/24/04, DES SVG-OM1 04/06/04, PTCA SVG-OM1 08/28/06 & 01/05/10 in setting of NSTEMI; DES LM/pLCX 12/19/12; DES SVG-RCA 05/31/17; DES SVG-RCA & DES LIMA-LAD 04/09/18; NSTEMI  with 06/22/20 LHC: occlusion of SVG-OM-LPDA, patent SVG-mRCA, patent LIMA-LAD with 50-70% ISR, 99% CX stent, no obvious target for PCI, consider redo-CABG if stenosis LIMA-LAD progressed; however CT surgery felt not a redo CABG candidate and advised medical therapy versus high risk PCI LM/LCX, medical therapy planned), sinus braycardia, HTN, HLD, PAD, DM2, ESRD (HD initiated 06/21/20), carotid artery stenosis (s/p right carotid endarterectomy 04/18/13), thrombocytopenia, anemia, SBO (s/p LOA, incisional hernia repair 03/04/10), hypothyroidism, asthma.   - Admission 06/18/20-06/30/20 for acute on chronic kidney failure, hyperkalemia, uremia, bradycardia. S/p Kayexalate, calcium gluconate, and insulin for hyperkalemia. S/p tunneled HD catheter right IJ and was started on hemodialysis. Cardiology consulted for elevated troponins/NSTEMI. B-blocker held due to bradycardia. S/p LHC 06/22/20 showing patent LIMA-LAD with 50-70% ISR, 99% CX stent, no obvious target for PCI, consider redo-CABG if stenosis LIMA-LAD progressed. However, not felt to be a redo CABG candidate by CT surgeon Gilford Raid, MD and advised high risk PCI option for the left main and left circumflex or medical therapy. Cardiology recommended continued medical therapy. S/p left  brachiocephalic AVF 42/35/36 (s/p drug-coated balloon angioplasty left axillary artery & left proximal left SCA stent 08/02/20 for Steal Syndrome with LUE AVF).   Per VVS, patient to continue ASA and Plavix.  She is for preoperative COVID-19 testing on 09/13/2020 and for labs and anesthesia team evaluation on the day of surgery.   VS:  BP Readings from Last 3 Encounters:  08/27/20 124/76  08/02/20 (!) 173/61  07/23/20 (!) 142/75   Pulse Readings from Last 3 Encounters:  08/27/20 83  08/02/20 70  07/23/20 75     PROVIDERS: Practice, [facility] is primary care Rozann Lesches, MD is listed as primary cardiologist   LABS: For ISTAT day of surgery. As of 08/02/20, H/H 12.2/36.0 (previously      8.7/27.4 on 06/29/20). A1c 6.2% 06/13/20.    PFTs 01/26/20: FVC 2.01 (57%), post 2.22 (63%). FEV1 1.45 (54%), post 1.58 (59%). DLCO unc 14.08 (66%)   IMAGES: 1V CXR 06/18/20: IMPRESSION: Pulmonary findings suggestive of pulmonary edema versus atypical/viral pneumonia. Suggestion of bilateral pleural effusions. Consider lateral view for further evaluation.  VQ Scan 06/16/20: IMPRESSION: No perfusion defects. No findings suggesting pulmonary embolus. Cardiac prominence noted.   EKG: 06/23/20: Normal sinus rhythm Possible Left atrial enlargement ST & T wave abnormality, consider lateral ischemia Abnormal ECG   CV: Cardiac cath 06/22/20:  Occlusion of the sequential saphenous vein graft to the obtuse marginal and PDA (left).  Patent saphenous vein graft to the mid RCA.  Patent ostial to proximal saphenous vein graft stent.  Patent stent in the mid RCA beyond the graft insertion site  Patent LIMA to mid LAD.  Ostial LIMA stent has 50 to 70% ISR.  Cath engagement causes damping of pressure.  Totally occluded  proximal RCA  Functionally occluded left main with very faint opacification of the circumflex system despite the absence of a clear channel.  99% stenosis of the ostial  to proximal circumflex stent.  Total occlusion of the proximal LAD.  Normal left ventricular end-diastolic pressure.  RECOMMENDATIONS: Severe native and bypass graft disease.  No obvious target for PCI this time.  Revascularization strategy would likely include repeat coronary bypass surgery when left internal mammary ostial stent progresses.  - 06/25/20 Lyman Bishop, MD: "Reviewed cath films with Dr. Tamala Julian and Irish Lack (who placed her previous stents) - possible intervention option to the proximal LAD, but may not be that beneficial. Recommended to optimize medications." - 06/23/20 Gilford Raid, MD: " I reviewed her prior cardiac catheterization from 2019 and 2018 as well as my prior operative note.  The obtuse marginal bifurcated into 2 subbranches with one branch being tiny and the other moderate sized.  The obtuse marginal was noted to be visible only proximally where it was grafted and then became intramyocardial throughout the remainder of its course.  The distal left circumflex terminates in the posterior descending and several small posterior lateral branches.  All these are small and would not be graftable where they could be visualized on the back of the heart under re-do circumstances.  She has a patent vein graft to the RCA and a patent LIMA to the LAD and I do not think her left circumflex branches are adequate to warrant redo CABG, especially in a morbidly obese, diabetic renal failure patient just starting dialysis.  She would have very high operative risk and I doubt that I would be able to graft the obtuse marginal or PDA having had prior bypass surgery with a difficulty exposing the lateral wall, small vessels, and intramyocardial obtuse marginal.  I discussed this with the patient and answered her questions.  Cardiology will need to decide whether there is any high risk PCI option for the left main and left circumflex or is a better to continue medical therapy."   Echo (Limited)  06/19/20: IMPRESSIONS  1. Left ventricular ejection fraction, by estimation, is 55 to 60%. The  left ventricle has normal function. The left ventricle has no regional  wall motion abnormalities.  2. The mitral valve is normal in structure. Mild to moderate mitral valve  regurgitation. No evidence of mitral stenosis.  3. Limited echocardiogram to reevalaute LV function and wall motion.    Echo (Complete) 06/13/20: IMPRESSIONS  1. Left ventricular ejection fraction, by estimation, is 55 to 60%. The  left ventricle has normal function. The left ventricle has no regional  wall motion abnormalities. There is mild concentric left ventricular  hypertrophy. Left ventricular diastolic  parameters are consistent with Grade II diastolic dysfunction  (pseudonormalization). Elevated left ventricular end-diastolic pressure.  2. Right ventricular systolic function is normal. The right ventricular  size is normal. There is normal pulmonary artery systolic pressure.  3. Left atrial size was mildly dilated.  4. Right atrial size was mildly dilated.  5. The mitral valve is normal in structure. Trivial mitral valve  regurgitation. No evidence of mitral stenosis.  6. The aortic valve is tricuspid. Aortic valve regurgitation is not  visualized. No aortic stenosis is present.  7. The inferior vena cava is normal in size with greater than 50%  respiratory variability, suggesting right atrial pressure of 3 mmHg.    Carotid US 12/16/19: Summary:  - Right Carotid: Velocities in the right ICA are consistent with a 60-79% stenosis.  The ECA appears >50% stenosed.  - Left Carotid: Velocities in the left ICA are consistent with a 1-39% stenosis.  - Vertebrals: Bilateral vertebral arteries demonstrate antegrade flow.    Past Medical History:  Diagnosis Date  . Anemia   . Asthma   . CAD (coronary artery disease)    Multivessel s/p CABG 2005, numerous PCIs since that time  . Carotid artery disease  (HCC)    R CEA  . Chronic kidney disease (CKD), stage III (moderate) (HCC)   . Essential hypertension   . Gout   . Hyperlipidemia   . Hypothyroidism   . Myocardial infarction ([place])   . PAD (peripheral artery disease) ([place])    Dr. Kellie Simmering  . S/P angioplasty with stent- DES to [facility] and to LIMA to LAD with DES 04/09/18.   04/10/2018  . SBO (small bowel obstruction) ([place]) 2011   Status post lysis of adhesions & hernia repair  . Sinus bradycardia   . Thrombocytopenia, unspecified ([place])   . Type 2 diabetes mellitus ([place])   . Umbilical hernia     Past Surgical History:  Procedure Laterality Date  . AORTIC ARCH ANGIOGRAPHY N/A 08/02/2020   Procedure: AORTIC ARCH ANGIOGRAPHY;  Surgeon: Waynetta Sandy, MD;  Location: [place] CV LAB;  Service: Cardiovascular;  Laterality: N/A;  Lt upper extermity  . AV FISTULA PLACEMENT Left 06/29/2020   Procedure: LEFT ARM ARTERIOVENOUS (AV) FISTULA;  Surgeon: Waynetta Sandy, MD;  Location: [facility];  Service: Vascular;  Laterality: Left;  ARM  . CESAREAN SECTION  1984  . CHOLECYSTECTOMY  2010  . CORONARY ARTERY BYPASS GRAFT  2005  . CORONARY BALLOON ANGIOPLASTY N/A 05/31/2017   Procedure: CORONARY BALLOON ANGIOPLASTY;  Surgeon: Jettie Booze, MD;  Location: [facility] LAB;  Service: Cardiovascular;  Laterality: N/A;  . CORONARY STENT INTERVENTION N/A 05/31/2017   Procedure: CORONARY STENT INTERVENTION;  Surgeon: Jettie Booze, MD;  Location: [place] CV LAB;  Service: Cardiovascular;  Laterality: N/A;  . CORONARY STENT INTERVENTION N/A 04/09/2018   Procedure: CORONARY STENT INTERVENTION;  Surgeon: Jettie Booze, MD;  Location: [facility] CV LAB;  Service: Cardiovascular;  Laterality: N/A;  SVG RCA  . ENDARTERECTOMY Right 04/18/2013   Procedure: ENDARTERECTOMY CAROTID;  Surgeon: Mal Misty, MD;  Location: [facility];  Service: Vascular;  Laterality: Right;  . HERNIA REPAIR  1989  . Incisional hernia repair x2   03/04/2010   Laparoscopic with 35cm mesh by Dr Ronnald Collum  . IR FLUORO GUIDE CV LINE RIGHT  06/21/2020  . IR US GUIDE VASC ACCESS RIGHT  06/21/2020  . LEFT HEART CATH AND CORS/GRAFTS ANGIOGRAPHY N/A 05/31/2017   Procedure: LEFT HEART CATH AND CORS/GRAFTS ANGIOGRAPHY;  Surgeon: Jettie Booze, MD;  Location: [facility] CV LAB;  Service: Cardiovascular;  Laterality: N/A;  . LEFT HEART CATH AND CORS/GRAFTS ANGIOGRAPHY N/A 04/08/2018   Procedure: LEFT HEART CATH AND CORS/GRAFTS ANGIOGRAPHY;  Surgeon: Jettie Booze, MD;  Location: [place] [facility] LAB;  Service: Cardiovascular;  Laterality: N/A;  . LEFT HEART CATH AND CORS/GRAFTS ANGIOGRAPHY N/A 06/22/2020   Procedure: LEFT HEART CATH AND CORS/GRAFTS ANGIOGRAPHY;  Surgeon: Belva Crome, MD;  Location: [place] CV LAB;  Service: Cardiovascular;  Laterality: N/A;  . LEFT HEART CATHETERIZATION WITH CORONARY ANGIOGRAM N/A 12/19/2012   Procedure: LEFT HEART CATHETERIZATION WITH CORONARY ANGIOGRAM;  Surgeon: Josue Hector, MD;  Location: [facility] CATH LAB;  Service: Cardiovascular;  Laterality: N/A;  . LEFT HEART CATHETERIZATION  WITH CORONARY/GRAFT ANGIOGRAM N/A 04/19/2013   Procedure: LEFT HEART CATHETERIZATION WITH Beatrix Fetters;  Surgeon: Lorretta Harp, MD;  Location: [facility] CATH LAB;  Service: Cardiovascular;  Laterality: N/A;  . PATCH ANGIOPLASTY Right 04/18/2013   Procedure: PATCH ANGIOPLASTY Right Internal Carotid Artery;  Surgeon: Mal Misty, MD;  Location: [facility];  Service: Vascular;  Laterality: Right;  . PERCUTANEOUS CORONARY STENT INTERVENTION (PCI-S) Right 12/19/2012   Procedure: PERCUTANEOUS CORONARY STENT INTERVENTION (PCI-S);  Surgeon: Josue Hector, MD;  Location: [facility] CATH LAB;  Service: Cardiovascular;  Laterality: Right;  . PERIPHERAL VASCULAR INTERVENTION Left 08/02/2020   Procedure: PERIPHERAL VASCULAR INTERVENTION;  Surgeon: Waynetta Sandy, MD;  Location: [place] CV LAB;  Service: Cardiovascular;  Laterality:  Left;  Left subclavian  . SHOULDER SURGERY      MEDICATIONS: No current facility-administered medications for this encounter.   . Albuterol Sulfate 108 (90 Base) MCG/ACT AEPB  . allopurinol (ZYLOPRIM) 100 MG tablet  . ALPRAZolam (XANAX) 0.5 MG tablet  . aspirin EC 81 MG tablet  . atorvastatin (LIPITOR) 80 MG tablet  . AURYXIA 1 GM 210 MG(Fe) tablet  . clopidogrel (PLAVIX) 75 MG tablet  . diclofenac Sodium (VOLTAREN) 1 % GEL  . gabapentin (NEURONTIN) 400 MG capsule  . ipratropium (ATROVENT HFA) 17 MCG/ACT inhaler  . levothyroxine (SYNTHROID) 112 MCG tablet  . nitroGLYCERIN (NITROSTAT) 0.4 MG SL tablet  . NOVOLIN 70/30 FLEXPEN (70-30) 100 UNIT/ML KwikPen  . ondansetron (ZOFRAN) 8 MG tablet  . oxyCODONE-acetaminophen (PERCOCET/ROXICET) 5-325 MG tablet  . pantoprazole (PROTONIX) 40 MG tablet  . ranolazine (RANEXA) 500 MG 12 hr tablet  . Vitamin D, Ergocalciferol, (DRISDOL) 1.25 MG (50000 UNIT) CAPS capsule  . acetaminophen (TYLENOL) 325 MG tablet  . famotidine (PEPCID) 20 MG tablet  - By current list, not taking acetaminophen, famotidine.   Myra Gianotti, PA-C Surgical Short Stay/Anesthesiology [facility] Phone [phone] [facility] Phone [phone] 09/13/2020 12:20 PM

## 2020-09-13 NOTE — Anesthesia Preprocedure Evaluation (Addendum)
Anesthesia Evaluation  Patient identified by MRN, date of birth, ID band Patient awake    Reviewed: Allergy & Precautions, NPO status , Patient's Chart, lab work & pertinent test results  Airway Mallampati: II  TM Distance: >3 FB Neck ROM: Full    Dental  (+) Poor Dentition, Chipped,    Pulmonary shortness of breath and with exertion, asthma , former smoker,  Quit smoking 2014, 20 pack year history    Pulmonary exam normal breath sounds clear to auscultation       Cardiovascular hypertension, + CAD, + Past MI, + Cardiac Stents (2018, 2019), + CABG (2005) and + Peripheral Vascular Disease  Normal cardiovascular exam+ Valvular Problems/Murmurs (mild-mod MR) MR  Rhythm:Regular Rate:Normal  Echo 06/2020: 1. Left ventricular ejection fraction, by estimation, is 55 to 60%. The  left ventricle has normal function. The left ventricle has no regional  wall motion abnormalities.  2. The mitral valve is normal in structure. Mild to moderate mitral valve  regurgitation. No evidence of mitral stenosis.  3. Limited echocardiogram to reevalaute LV function and wall motion.   On aspirin, plavix- instructed to continue preop   Admitted 06/2020- s/p LHC 06/22/20 showing patent LIMA-LAD with 50-70% ISR, 99% CX stent, no obvious target for PCI- not a candidate for redo CABG per cardiac surgery    Neuro/Psych PSYCHIATRIC DISORDERS Anxiety negative neurological ROS     GI/Hepatic Neg liver ROS, GERD  Medicated and Controlled,  Endo/Other  diabetes, Poorly Controlled, Type 2, Insulin DependentHypothyroidism   Renal/GU ESRF and DialysisRenal diseaseL arm steal syndrome  Last HD yesterday  K 3.3 today   negative genitourinary   Musculoskeletal negative musculoskeletal ROS (+)   Abdominal (+) + obese,   Peds  Hematology  (+) Blood dyscrasia, anemia ,   Anesthesia Other Findings   Reproductive/Obstetrics negative OB ROS                           Anesthesia Physical Anesthesia Plan  ASA: IV  Anesthesia Plan: General   Post-op Pain Management:    Induction: Intravenous  PONV Risk Score and Plan: 3 and Ondansetron, Dexamethasone, Treatment may vary due to age or medical condition and Midazolam  Airway Management Planned: LMA  Additional Equipment: None  Intra-op Plan:   Post-operative Plan: Extubation in OR  Informed Consent: I have reviewed the patients History and Physical, chart, labs and discussed the procedure including the risks, benefits and alternatives for the proposed anesthesia with the patient or authorized representative who has indicated his/her understanding and acceptance.     Dental advisory given  Plan Discussed with: CRNA  Anesthesia Plan Comments:       Anesthesia Quick Evaluation

## 2020-09-15 ENCOUNTER — Ambulatory Visit (HOSPITAL_COMMUNITY): Payer: Medicare HMO | Admitting: Vascular Surgery

## 2020-09-15 ENCOUNTER — Encounter (HOSPITAL_COMMUNITY): Payer: Self-pay | Admitting: Vascular Surgery

## 2020-09-15 ENCOUNTER — Ambulatory Visit (HOSPITAL_COMMUNITY)
Admission: RE | Admit: 2020-09-15 | Discharge: 2020-09-15 | Disposition: A | Discharge status: Home / Self Care | Payer: Medicare HMO | Attending: Vascular Surgery | Admitting: Vascular Surgery

## 2020-09-15 ENCOUNTER — Other Ambulatory Visit: Payer: Self-pay

## 2020-09-15 ENCOUNTER — Encounter (HOSPITAL_COMMUNITY)
Admission: RE | Disposition: A | Discharge status: Home / Self Care | Payer: Self-pay | Source: Home / Self Care | Attending: Vascular Surgery

## 2020-09-15 DIAGNOSIS — Z79899 Other long term (current) drug therapy: Secondary | ICD-10-CM | POA: Diagnosis not present

## 2020-09-15 DIAGNOSIS — Z955 Presence of coronary angioplasty implant and graft: Secondary | ICD-10-CM | POA: Insufficient documentation

## 2020-09-15 DIAGNOSIS — Y712 Prosthetic and other implants, materials and accessory cardiovascular devices associated with adverse incidents: Secondary | ICD-10-CM | POA: Insufficient documentation

## 2020-09-15 DIAGNOSIS — Z7982 Long term (current) use of aspirin: Secondary | ICD-10-CM | POA: Diagnosis not present

## 2020-09-15 DIAGNOSIS — E1122 Type 2 diabetes mellitus with diabetic chronic kidney disease: Secondary | ICD-10-CM | POA: Insufficient documentation

## 2020-09-15 DIAGNOSIS — N1832 Chronic kidney disease, stage 3b: Secondary | ICD-10-CM

## 2020-09-15 DIAGNOSIS — E785 Hyperlipidemia, unspecified: Secondary | ICD-10-CM | POA: Diagnosis not present

## 2020-09-15 DIAGNOSIS — E039 Hypothyroidism, unspecified: Secondary | ICD-10-CM | POA: Diagnosis not present

## 2020-09-15 DIAGNOSIS — I252 Old myocardial infarction: Secondary | ICD-10-CM | POA: Diagnosis not present

## 2020-09-15 DIAGNOSIS — T82898A Other specified complication of vascular prosthetic devices, implants and grafts, initial encounter: Secondary | ICD-10-CM | POA: Diagnosis not present

## 2020-09-15 DIAGNOSIS — Z951 Presence of aortocoronary bypass graft: Secondary | ICD-10-CM | POA: Diagnosis not present

## 2020-09-15 DIAGNOSIS — Z87891 Personal history of nicotine dependence: Secondary | ICD-10-CM | POA: Diagnosis not present

## 2020-09-15 DIAGNOSIS — N185 Chronic kidney disease, stage 5: Secondary | ICD-10-CM | POA: Diagnosis not present

## 2020-09-15 DIAGNOSIS — I2510 Atherosclerotic heart disease of native coronary artery without angina pectoris: Secondary | ICD-10-CM | POA: Diagnosis not present

## 2020-09-15 DIAGNOSIS — N186 End stage renal disease: Secondary | ICD-10-CM | POA: Diagnosis not present

## 2020-09-15 DIAGNOSIS — Z794 Long term (current) use of insulin: Secondary | ICD-10-CM | POA: Diagnosis not present

## 2020-09-15 DIAGNOSIS — Z992 Dependence on renal dialysis: Secondary | ICD-10-CM | POA: Insufficient documentation

## 2020-09-15 DIAGNOSIS — I5032 Chronic diastolic (congestive) heart failure: Secondary | ICD-10-CM | POA: Diagnosis not present

## 2020-09-15 DIAGNOSIS — Z880 Allergy status to penicillin: Secondary | ICD-10-CM | POA: Diagnosis not present

## 2020-09-15 DIAGNOSIS — I120 Hypertensive chronic kidney disease with stage 5 chronic kidney disease or end stage renal disease: Secondary | ICD-10-CM | POA: Diagnosis not present

## 2020-09-15 DIAGNOSIS — E1151 Type 2 diabetes mellitus with diabetic peripheral angiopathy without gangrene: Secondary | ICD-10-CM | POA: Diagnosis not present

## 2020-09-15 DIAGNOSIS — Z79890 Hormone replacement therapy (postmenopausal): Secondary | ICD-10-CM | POA: Insufficient documentation

## 2020-09-15 DIAGNOSIS — I132 Hypertensive heart and chronic kidney disease with heart failure and with stage 5 chronic kidney disease, or end stage renal disease: Secondary | ICD-10-CM | POA: Diagnosis not present

## 2020-09-15 HISTORY — DX: Pneumonia, unspecified organism: J18.9

## 2020-09-15 HISTORY — DX: Heart failure, unspecified: I50.9

## 2020-09-15 HISTORY — DX: Personal history of other medical treatment: Z92.89

## 2020-09-15 HISTORY — DX: Dyspnea, unspecified: R06.00

## 2020-09-15 HISTORY — PX: LIGATION OF ARTERIOVENOUS  FISTULA: SHX5948

## 2020-09-15 HISTORY — DX: Anxiety disorder, unspecified: F41.9

## 2020-09-15 LAB — POCT I-STAT, CHEM 8
BUN: 15 mg/dL (ref 8–23)
Calcium, Ion: 1.08 mmol/L — ABNORMAL LOW (ref 1.15–1.40)
Chloride: 97 mmol/L — ABNORMAL LOW (ref 98–111)
Creatinine, Ser: 3.4 mg/dL — ABNORMAL HIGH (ref 0.44–1.00)
Glucose, Bld: 145 mg/dL — ABNORMAL HIGH (ref 70–99)
HCT: 42 % (ref 36.0–46.0)
Hemoglobin: 14.3 g/dL (ref 12.0–15.0)
Potassium: 3.3 mmol/L — ABNORMAL LOW (ref 3.5–5.1)
Sodium: 139 mmol/L (ref 135–145)
TCO2: 30 mmol/L (ref 22–32)

## 2020-09-15 LAB — GLUCOSE, CAPILLARY
Glucose-Capillary: 152 mg/dL — ABNORMAL HIGH (ref 70–99)
Glucose-Capillary: 147 mg/dL — ABNORMAL HIGH (ref 70–99)
Glucose-Capillary: 137 mg/dL — ABNORMAL HIGH (ref 70–99)
Glucose-Capillary: 123 mg/dL — ABNORMAL HIGH (ref 70–99)

## 2020-09-15 SURGERY — LIGATION OF ARTERIOVENOUS  FISTULA
Anesthesia: General | Site: Arm Upper | Laterality: Left

## 2020-09-15 MED ORDER — FENTANYL CITRATE (PF) 250 MCG/5ML IJ SOLN
INTRAMUSCULAR | Status: AC
  Filled 2020-09-15: qty 5

## 2020-09-15 MED ORDER — ONDANSETRON HCL 4 MG/2ML IJ SOLN: 4.0000 mg | Freq: Once | INTRAMUSCULAR | Status: DC | PRN

## 2020-09-15 MED ORDER — PROPOFOL 10 MG/ML IV BOLUS
INTRAVENOUS | Status: AC
  Filled 2020-09-15: qty 20

## 2020-09-15 MED ORDER — LIDOCAINE 2% (20 MG/ML) 5 ML SYRINGE
INTRAMUSCULAR | Status: AC
  Filled 2020-09-15: qty 5

## 2020-09-15 MED ORDER — ONDANSETRON HCL 4 MG/2ML IJ SOLN
INTRAMUSCULAR | Status: AC
  Filled 2020-09-15: qty 2

## 2020-09-15 MED ORDER — PROPOFOL 10 MG/ML IV BOLUS
INTRAVENOUS | Status: DC | PRN
  Administered 2020-09-15 (×2): 50 mg via INTRAVENOUS
  Administered 2020-09-15: 30 mg via INTRAVENOUS

## 2020-09-15 MED ORDER — FENTANYL CITRATE (PF) 100 MCG/2ML IJ SOLN: 25.0000 ug | INTRAMUSCULAR | Status: DC | PRN

## 2020-09-15 MED ORDER — ORAL CARE MOUTH RINSE: 15.0000 mL | Freq: Once | OROMUCOSAL | Status: AC

## 2020-09-15 MED ORDER — DEXAMETHASONE SODIUM PHOSPHATE 10 MG/ML IJ SOLN
INTRAMUSCULAR | Status: DC | PRN
  Administered 2020-09-15: 5 mg via INTRAVENOUS

## 2020-09-15 MED ORDER — DEXAMETHASONE SODIUM PHOSPHATE 10 MG/ML IJ SOLN
INTRAMUSCULAR | Status: AC
  Filled 2020-09-15: qty 1

## 2020-09-15 MED ORDER — OXYCODONE HCL 5 MG PO TABS: 5.0000 mg | ORAL_TABLET | Freq: Once | ORAL | Status: AC | PRN

## 2020-09-15 MED ORDER — PHENYLEPHRINE 40 MCG/ML (10ML) SYRINGE FOR IV PUSH (FOR BLOOD PRESSURE SUPPORT)
PREFILLED_SYRINGE | INTRAVENOUS | Status: DC | PRN
  Administered 2020-09-15: 80 ug via INTRAVENOUS
  Administered 2020-09-15 (×4): 40 ug via INTRAVENOUS

## 2020-09-15 MED ORDER — FENTANYL CITRATE (PF) 250 MCG/5ML IJ SOLN
INTRAMUSCULAR | Status: DC | PRN
  Administered 2020-09-15 (×2): 25 ug via INTRAVENOUS

## 2020-09-15 MED ORDER — PHENYLEPHRINE HCL-NACL 10-0.9 MG/250ML-% IV SOLN
INTRAVENOUS | Status: DC | PRN
  Administered 2020-09-15: 40 ug/min via INTRAVENOUS

## 2020-09-15 MED ORDER — ONDANSETRON HCL 4 MG/2ML IJ SOLN
INTRAMUSCULAR | Status: DC | PRN
  Administered 2020-09-15: 4 mg via INTRAVENOUS

## 2020-09-15 MED ORDER — ACETAMINOPHEN 10 MG/ML IV SOLN: 1000.0000 mg | Freq: Once | INTRAVENOUS | Status: DC

## 2020-09-15 MED ORDER — CHLORHEXIDINE GLUCONATE 0.12 % MT SOLN
15.0000 mL | Freq: Once | OROMUCOSAL | Status: AC
  Administered 2020-09-15: 15 mL via OROMUCOSAL
  Filled 2020-09-15: qty 15

## 2020-09-15 MED ORDER — 0.9 % SODIUM CHLORIDE (POUR BTL) OPTIME
TOPICAL | Status: DC | PRN
  Administered 2020-09-15: 1000 mL

## 2020-09-15 MED ORDER — CHLORHEXIDINE GLUCONATE 4 % EX LIQD: 60.0000 mL | Freq: Once | CUTANEOUS | Status: DC

## 2020-09-15 MED ORDER — OXYCODONE HCL 5 MG PO TABS
ORAL_TABLET | ORAL | Status: AC
  Administered 2020-09-15: 5 mg via ORAL
  Filled 2020-09-15: qty 1

## 2020-09-15 MED ORDER — LIDOCAINE-EPINEPHRINE (PF) 1 %-1:200000 IJ SOLN
INTRAMUSCULAR | Status: AC
  Filled 2020-09-15: qty 30

## 2020-09-15 MED ORDER — OXYCODONE-ACETAMINOPHEN 5-325 MG PO TABS: 1.0000 | ORAL_TABLET | Freq: Four times a day (QID) | ORAL | 0 refills | Status: DC | PRN

## 2020-09-15 MED ORDER — OXYCODONE HCL 5 MG/5ML PO SOLN: 5.0000 mg | Freq: Once | ORAL | Status: AC | PRN

## 2020-09-15 MED ORDER — MIDAZOLAM HCL 2 MG/2ML IJ SOLN
INTRAMUSCULAR | Status: AC
  Filled 2020-09-15: qty 2

## 2020-09-15 MED ORDER — VANCOMYCIN HCL IN DEXTROSE 1-5 GM/200ML-% IV SOLN
1000.0000 mg | INTRAVENOUS | Status: AC
  Administered 2020-09-15: 1000 mg via INTRAVENOUS
  Filled 2020-09-15: qty 200

## 2020-09-15 MED ORDER — LIDOCAINE 2% (20 MG/ML) 5 ML SYRINGE
INTRAMUSCULAR | Status: DC | PRN
  Administered 2020-09-15: 40 mg via INTRAVENOUS

## 2020-09-15 MED ORDER — SODIUM CHLORIDE 0.9 % IV SOLN
INTRAVENOUS | Status: AC
  Filled 2020-09-15: qty 1.2

## 2020-09-15 MED ORDER — LACTATED RINGERS IV SOLN: INTRAVENOUS | Status: DC

## 2020-09-15 MED ORDER — SODIUM CHLORIDE 0.9 % IV SOLN: INTRAVENOUS | Status: DC

## 2020-09-15 SURGICAL SUPPLY — 27 items
CANISTER SUCT 3000ML PPV (MISCELLANEOUS) ×2 IMPLANT
CLIP VESOCCLUDE MED 6/CT (CLIP) ×2 IMPLANT
CLIP VESOCCLUDE SM WIDE 6/CT (CLIP) ×2 IMPLANT
COVER WAND RF STERILE (DRAPES) IMPLANT
DERMABOND ADVANCED (GAUZE/BANDAGES/DRESSINGS) ×1
DERMABOND ADVANCED .7 DNX12 (GAUZE/BANDAGES/DRESSINGS) ×1 IMPLANT
ELECT REM PT RETURN 9FT ADLT (ELECTROSURGICAL) ×2
ELECTRODE REM PT RTRN 9FT ADLT (ELECTROSURGICAL) ×1 IMPLANT
GLOVE BIO SURGEON STRL SZ7.5 (GLOVE) ×2 IMPLANT
GOWN STRL REUS W/ TWL LRG LVL3 (GOWN DISPOSABLE) ×2 IMPLANT
GOWN STRL REUS W/ TWL XL LVL3 (GOWN DISPOSABLE) ×1 IMPLANT
GOWN STRL REUS W/TWL LRG LVL3 (GOWN DISPOSABLE) ×4
GOWN STRL REUS W/TWL XL LVL3 (GOWN DISPOSABLE) ×2
KIT BASIN OR (CUSTOM PROCEDURE TRAY) ×2 IMPLANT
KIT TURNOVER KIT B (KITS) ×2 IMPLANT
NS IRRIG 1000ML POUR BTL (IV SOLUTION) ×2 IMPLANT
PACK CV ACCESS (CUSTOM PROCEDURE TRAY) ×2 IMPLANT
PAD ARMBOARD 7.5X6 YLW CONV (MISCELLANEOUS) ×4 IMPLANT
SUT ETHILON 3 0 PS 1 (SUTURE) IMPLANT
SUT MNCRL AB 4-0 PS2 18 (SUTURE) ×2 IMPLANT
SUT PROLENE 6 0 BV (SUTURE) ×2 IMPLANT
SUT SILK 0 TIES 10X30 (SUTURE) ×2 IMPLANT
SUT VIC AB 3-0 SH 27 (SUTURE) ×2
SUT VIC AB 3-0 SH 27X BRD (SUTURE) ×1 IMPLANT
TOWEL GREEN STERILE (TOWEL DISPOSABLE) ×2 IMPLANT
UNDERPAD 30X36 HEAVY ABSORB (UNDERPADS AND DIAPERS) ×2 IMPLANT
WATER STERILE IRR 1000ML POUR (IV SOLUTION) ×2 IMPLANT

## 2020-09-15 NOTE — Op Note (Signed)
    Patient name: Donna Howe MRN: [id number] DOB: 1955/11/28 Sex: female  09/15/2020 Pre-operative Diagnosis: esrd, left arm steal syndrome Post-operative diagnosis:  Same Surgeon:  Eda Paschal. Donzetta Matters, MD Assistant: Leontine Locket, PA Procedure Performed:  Ligation of left arm cephalic vein AV fistula   Indications: 64 year old female with history of end-stage renal disease currently dialyzing via catheter.  She had a left arm brachial artery to cephalic vein fistula placed.  She subsequently developed significant steal in the left hand we then performed drug-coated balloon angioplasty of her left axillary artery and stent of her left subclavian artery.  This did improve her flow in her upper extremity although her symptoms remained.  She is now indicated for ligation of the fistula.  An assistant was necessary for suction, retraction and assistance with ligation of the fistula itself.  Findings: Fistula had very strong inflow.  At the level it was ligated was approximately 5 mm in diameter.  At completion was a very strong left radial pulse that was improved from preoperative exam.  After she recovers from this procedure she will need consideration of right arm access in the future.   Procedure:  The patient was identified in the holding area and taken to the operating where she is placed supine on upper table and LMA anesthesia was induced.  She was sterilely prepped draped in the left upper extremity in the usual fashion antibiotics were administered and a timeout was called.  The fistula is easily palpable and a transverse incision was made overlying this a few centimeters above the antecubital space.  I dissected out the fistula placed a vessel loop around it.  I then clamped it towards the antecubitum tied off with 2-0 silk suture more cephalad and transected it.  The more distal aspect was then oversewn with 6-0 Prolene suture in a running mattress fashion.  After this there was a  palpable radial artery pulse at the wrist.  We obtain hemostasis irrigated the wound and closed in layers of Vicryl and Monocryl.  Dermabond was placed at the skin level.  She was awakened from anesthesia having tolerated patient without any complication.  All counts were correct at completion.  EBL: 10 cc   Alvar Malinoski C. Donzetta Matters, MD [facility] Office: [phone] Pager: [phone]

## 2020-09-15 NOTE — Transfer of Care (Signed)
Immediate Anesthesia Transfer of Care Note  Patient: Donna Howe  Procedure(s) Performed: LIGATION OF LEFT ARM ARTERIOVENOUS  FISTULA (Left Arm Upper)  Patient Location: PACU  Anesthesia Type:General  Level of Consciousness: awake and patient cooperative  Airway & Oxygen Therapy: Patient Spontanous Breathing and Patient connected to face mask oxygen  Post-op Assessment: Report given to RN and Post -op Vital signs reviewed and stable  Post vital signs: Reviewed  Last Vitals:  Vitals Value Taken Time  BP 122/55 09/15/20 1329  Temp    Pulse 64 09/15/20 1331  Resp 14 09/15/20 1331  SpO2 100 % 09/15/20 1331  Vitals shown include unvalidated device data.  Last Pain:  Vitals:   09/15/20 0936  TempSrc:   PainSc: 10-Worst pain ever      Patients Stated Pain Goal: 3 (83/67/25 5001)  Complications: No complications documented.

## 2020-09-15 NOTE — Anesthesia Postprocedure Evaluation (Signed)
Anesthesia Post Note  Patient: Donna Howe  Procedure(s) Performed: LIGATION OF LEFT ARM ARTERIOVENOUS  FISTULA (Left Arm Upper)     Patient location during evaluation: PACU Anesthesia Type: General Level of consciousness: awake and alert, oriented and patient cooperative Pain management: pain level controlled Vital Signs Assessment: post-procedure vital signs reviewed and stable Respiratory status: spontaneous breathing, nonlabored ventilation and respiratory function stable Cardiovascular status: blood pressure returned to baseline and stable Postop Assessment: no apparent nausea or vomiting Anesthetic complications: no   No complications documented.  Last Vitals:  Vitals:   09/15/20 1400 09/15/20 1415  BP: (!) 124/52 (!) 155/62  Pulse: 66 68  Resp: 14 12  Temp:  36.7 C  SpO2: 96% 98%    Last Pain:  Vitals:   09/15/20 1415  TempSrc:   PainSc: [place]

## 2020-09-15 NOTE — Anesthesia Procedure Notes (Signed)
Procedure Name: LMA Insertion Date/Time: 09/15/2020 12:47 PM Performed by: Janene Harvey, CRNA Pre-anesthesia Checklist: Patient identified, Emergency Drugs available, Suction available and Patient being monitored Patient Re-evaluated:Patient Re-evaluated prior to induction Oxygen Delivery Method: Circle system utilized Preoxygenation: Pre-oxygenation with 100% oxygen Induction Type: IV induction Ventilation: Mask ventilation without difficulty LMA: LMA inserted LMA Size: 4.0 Placement Confirmation: positive ETCO2 and breath sounds checked- equal and bilateral Dental Injury: Teeth and Oropharynx as per pre-operative assessment

## 2020-09-15 NOTE — Interval H&P Note (Signed)
History and Physical Interval Note:  09/15/2020 12:28 PM  Donna Howe  has presented today for surgery, with the diagnosis of LEFT ARM STEAL SYNDROME.  The various methods of treatment have been discussed with the patient and family. After consideration of risks, benefits and other options for treatment, the patient has consented to  Procedure(s): LIGATION OF LEFT ARM ARTERIOVENOUS  FISTULA (Left) as a surgical intervention.  The patient's history has been reviewed, patient examined, no change in status, stable for surgery.  I have reviewed the patient's chart and labs.  Questions were answered to the patient's satisfaction.     Servando Snare

## 2020-09-15 NOTE — Discharge Instructions (Addendum)
° °  [facility]  Discharge Instructions  AV Fistula or Graft Surgery for Dialysis Access  Please refer to the following instructions for your post-procedure care. Your surgeon or physician assistant will discuss any changes with you.  Activity  You may drive the day following your surgery, if you are comfortable and no longer taking prescription pain medication. Resume full activity as the soreness in your incision resolves.  Bathing/Showering  You may shower after you go home. Keep your incision dry for 48 hours. Do not soak in a bathtub, hot tub, or swim until the incision heals completely. You may not shower if you have a hemodialysis catheter.  Incision Care  Clean your incision with mild soap and water after 48 hours. Pat the area dry with a clean towel. You do not need a bandage unless otherwise instructed. Do not apply any ointments or creams to your incision. You may have skin glue on your incision. Do not peel it off. It will come off on its own in about one week. Your arm may swell a bit after surgery. To reduce swelling use pillows to elevate your arm so it is above your heart. Your doctor will tell you if you need to lightly wrap your arm with an ACE bandage.  Diet  Resume your normal diet. There are not special food restrictions following this procedure. In order to heal from your surgery, it is CRITICAL to get adequate nutrition. Your body requires vitamins, minerals, and protein. Vegetables are the best source of vitamins and minerals. Vegetables also provide the perfect balance of protein. Processed food has little nutritional value, so try to avoid this.  Medications  Resume taking all of your medications. If your incision is causing pain, you may take over-the counter pain relievers such as acetaminophen (Tylenol). If you were prescribed a stronger pain medication, please be aware these medications can cause nausea and constipation. Prevent  nausea by taking the medication with a snack or meal. Avoid constipation by drinking plenty of fluids and eating foods with high amount of fiber, such as fruits, vegetables, and grains.  Do not take Tylenol if you are taking prescription pain medications.  Follow up Your surgeon may want to see you in the office following your access surgery. If so, this will be arranged at the time of your surgery.  Please call us immediately for any of the following conditions:   Increased pain, redness, drainage (pus) from your incision site  Fever of 101 degrees or higher  Severe or worsening pain at your incision site  Hand pain or numbness.   Reduce your risk of vascular disease:   Stop smoking. If you would like help, call [facility] at 1-800-QUIT-NOW (807) 001-1180) or [place] at [place] your cholesterol  Maintain a desired weight  Control your diabetes  Keep your blood pressure down  Dialysis  It will take several weeks to several months for your new dialysis access to be ready for use. Your surgeon will determine when it is okay to use it. Your nephrologist will continue to direct your dialysis. You can continue to use your Permcath until your new access is ready for use.   09/15/2020 Donna Howe [id number] 12-Nov-1955  Surgeon(s): Waynetta Sandy, MD  Procedure(s): LIGATION OF LEFT ARM ARTERIOVENOUS  FISTULA   If you have any questions, please call the office at [phone].

## 2020-09-16 ENCOUNTER — Encounter (HOSPITAL_COMMUNITY): Payer: Self-pay | Admitting: Vascular Surgery

## 2020-09-17 DIAGNOSIS — E1122 Type 2 diabetes mellitus with diabetic chronic kidney disease: Secondary | ICD-10-CM | POA: Diagnosis not present

## 2020-09-17 DIAGNOSIS — Z992 Dependence on renal dialysis: Secondary | ICD-10-CM | POA: Diagnosis not present

## 2020-09-17 DIAGNOSIS — N186 End stage renal disease: Secondary | ICD-10-CM | POA: Diagnosis not present

## 2020-09-19 DIAGNOSIS — E1122 Type 2 diabetes mellitus with diabetic chronic kidney disease: Secondary | ICD-10-CM | POA: Diagnosis not present

## 2020-09-19 DIAGNOSIS — E039 Hypothyroidism, unspecified: Secondary | ICD-10-CM | POA: Diagnosis not present

## 2020-09-19 DIAGNOSIS — N2581 Secondary hyperparathyroidism of renal origin: Secondary | ICD-10-CM | POA: Diagnosis not present

## 2020-09-19 DIAGNOSIS — N186 End stage renal disease: Secondary | ICD-10-CM | POA: Diagnosis not present

## 2020-09-19 DIAGNOSIS — Z992 Dependence on renal dialysis: Secondary | ICD-10-CM | POA: Diagnosis not present

## 2020-09-19 DIAGNOSIS — D689 Coagulation defect, unspecified: Secondary | ICD-10-CM | POA: Diagnosis not present

## 2020-09-21 DIAGNOSIS — Z992 Dependence on renal dialysis: Secondary | ICD-10-CM | POA: Diagnosis not present

## 2020-09-21 DIAGNOSIS — E1122 Type 2 diabetes mellitus with diabetic chronic kidney disease: Secondary | ICD-10-CM | POA: Diagnosis not present

## 2020-09-21 DIAGNOSIS — D689 Coagulation defect, unspecified: Secondary | ICD-10-CM | POA: Diagnosis not present

## 2020-09-21 DIAGNOSIS — N2581 Secondary hyperparathyroidism of renal origin: Secondary | ICD-10-CM | POA: Diagnosis not present

## 2020-09-21 DIAGNOSIS — E039 Hypothyroidism, unspecified: Secondary | ICD-10-CM | POA: Diagnosis not present

## 2020-09-21 DIAGNOSIS — N186 End stage renal disease: Secondary | ICD-10-CM | POA: Diagnosis not present

## 2020-09-23 DIAGNOSIS — E1122 Type 2 diabetes mellitus with diabetic chronic kidney disease: Secondary | ICD-10-CM | POA: Diagnosis not present

## 2020-09-23 DIAGNOSIS — E039 Hypothyroidism, unspecified: Secondary | ICD-10-CM | POA: Diagnosis not present

## 2020-09-23 DIAGNOSIS — N186 End stage renal disease: Secondary | ICD-10-CM | POA: Diagnosis not present

## 2020-09-23 DIAGNOSIS — N2581 Secondary hyperparathyroidism of renal origin: Secondary | ICD-10-CM | POA: Diagnosis not present

## 2020-09-23 DIAGNOSIS — D689 Coagulation defect, unspecified: Secondary | ICD-10-CM | POA: Diagnosis not present

## 2020-09-23 DIAGNOSIS — Z992 Dependence on renal dialysis: Secondary | ICD-10-CM | POA: Diagnosis not present

## 2020-09-25 DIAGNOSIS — E1122 Type 2 diabetes mellitus with diabetic chronic kidney disease: Secondary | ICD-10-CM | POA: Diagnosis not present

## 2020-09-25 DIAGNOSIS — Z992 Dependence on renal dialysis: Secondary | ICD-10-CM | POA: Diagnosis not present

## 2020-09-25 DIAGNOSIS — E039 Hypothyroidism, unspecified: Secondary | ICD-10-CM | POA: Diagnosis not present

## 2020-09-25 DIAGNOSIS — N186 End stage renal disease: Secondary | ICD-10-CM | POA: Diagnosis not present

## 2020-09-25 DIAGNOSIS — N2581 Secondary hyperparathyroidism of renal origin: Secondary | ICD-10-CM | POA: Diagnosis not present

## 2020-09-25 DIAGNOSIS — D689 Coagulation defect, unspecified: Secondary | ICD-10-CM | POA: Diagnosis not present

## 2020-09-28 DIAGNOSIS — E039 Hypothyroidism, unspecified: Secondary | ICD-10-CM | POA: Diagnosis not present

## 2020-09-28 DIAGNOSIS — E1122 Type 2 diabetes mellitus with diabetic chronic kidney disease: Secondary | ICD-10-CM | POA: Diagnosis not present

## 2020-09-28 DIAGNOSIS — Z992 Dependence on renal dialysis: Secondary | ICD-10-CM | POA: Diagnosis not present

## 2020-09-28 DIAGNOSIS — N2581 Secondary hyperparathyroidism of renal origin: Secondary | ICD-10-CM | POA: Diagnosis not present

## 2020-09-28 DIAGNOSIS — N186 End stage renal disease: Secondary | ICD-10-CM | POA: Diagnosis not present

## 2020-09-28 DIAGNOSIS — D689 Coagulation defect, unspecified: Secondary | ICD-10-CM | POA: Diagnosis not present

## 2020-09-30 DIAGNOSIS — E1122 Type 2 diabetes mellitus with diabetic chronic kidney disease: Secondary | ICD-10-CM | POA: Diagnosis not present

## 2020-09-30 DIAGNOSIS — Z992 Dependence on renal dialysis: Secondary | ICD-10-CM | POA: Diagnosis not present

## 2020-09-30 DIAGNOSIS — N2581 Secondary hyperparathyroidism of renal origin: Secondary | ICD-10-CM | POA: Diagnosis not present

## 2020-09-30 DIAGNOSIS — N186 End stage renal disease: Secondary | ICD-10-CM | POA: Diagnosis not present

## 2020-09-30 DIAGNOSIS — U071 COVID-19: Secondary | ICD-10-CM | POA: Insufficient documentation

## 2020-09-30 DIAGNOSIS — E039 Hypothyroidism, unspecified: Secondary | ICD-10-CM | POA: Diagnosis not present

## 2020-09-30 DIAGNOSIS — D689 Coagulation defect, unspecified: Secondary | ICD-10-CM | POA: Diagnosis not present

## 2020-10-01 DIAGNOSIS — N189 Chronic kidney disease, unspecified: Secondary | ICD-10-CM | POA: Diagnosis not present

## 2020-10-01 DIAGNOSIS — E039 Hypothyroidism, unspecified: Secondary | ICD-10-CM | POA: Diagnosis not present

## 2020-10-01 DIAGNOSIS — Z20828 Contact with and (suspected) exposure to other viral communicable diseases: Secondary | ICD-10-CM | POA: Diagnosis not present

## 2020-10-01 DIAGNOSIS — D689 Coagulation defect, unspecified: Secondary | ICD-10-CM | POA: Diagnosis not present

## 2020-10-01 DIAGNOSIS — E1122 Type 2 diabetes mellitus with diabetic chronic kidney disease: Secondary | ICD-10-CM | POA: Diagnosis not present

## 2020-10-01 DIAGNOSIS — Z992 Dependence on renal dialysis: Secondary | ICD-10-CM | POA: Diagnosis not present

## 2020-10-01 DIAGNOSIS — J449 Chronic obstructive pulmonary disease, unspecified: Secondary | ICD-10-CM | POA: Diagnosis not present

## 2020-10-01 DIAGNOSIS — N2581 Secondary hyperparathyroidism of renal origin: Secondary | ICD-10-CM | POA: Diagnosis not present

## 2020-10-01 DIAGNOSIS — N186 End stage renal disease: Secondary | ICD-10-CM | POA: Diagnosis not present

## 2020-10-01 DIAGNOSIS — N185 Chronic kidney disease, stage 5: Secondary | ICD-10-CM | POA: Diagnosis not present

## 2020-10-05 DIAGNOSIS — J189 Pneumonia, unspecified organism: Secondary | ICD-10-CM | POA: Diagnosis not present

## 2020-10-05 DIAGNOSIS — R069 Unspecified abnormalities of breathing: Secondary | ICD-10-CM | POA: Diagnosis not present

## 2020-10-05 DIAGNOSIS — U071 COVID-19: Secondary | ICD-10-CM | POA: Diagnosis not present

## 2020-10-05 DIAGNOSIS — R52 Pain, unspecified: Secondary | ICD-10-CM | POA: Diagnosis not present

## 2020-10-05 DIAGNOSIS — Z209 Contact with and (suspected) exposure to unspecified communicable disease: Secondary | ICD-10-CM | POA: Diagnosis not present

## 2020-10-07 ENCOUNTER — Emergency Department (HOSPITAL_COMMUNITY)
Admission: EM | Admit: 2020-10-07 | Discharge: 2020-10-07 | Disposition: A | Discharge status: Home / Self Care | Payer: Medicare HMO | Attending: Emergency Medicine | Admitting: Emergency Medicine

## 2020-10-07 ENCOUNTER — Encounter (HOSPITAL_COMMUNITY): Payer: Self-pay | Admitting: Emergency Medicine

## 2020-10-07 ENCOUNTER — Emergency Department (HOSPITAL_COMMUNITY): Payer: Medicare HMO

## 2020-10-07 ENCOUNTER — Other Ambulatory Visit: Payer: Self-pay

## 2020-10-07 DIAGNOSIS — Z992 Dependence on renal dialysis: Secondary | ICD-10-CM | POA: Insufficient documentation

## 2020-10-07 DIAGNOSIS — N186 End stage renal disease: Secondary | ICD-10-CM | POA: Diagnosis not present

## 2020-10-07 DIAGNOSIS — E039 Hypothyroidism, unspecified: Secondary | ICD-10-CM | POA: Insufficient documentation

## 2020-10-07 DIAGNOSIS — R0602 Shortness of breath: Secondary | ICD-10-CM | POA: Diagnosis not present

## 2020-10-07 DIAGNOSIS — U071 COVID-19: Secondary | ICD-10-CM | POA: Diagnosis not present

## 2020-10-07 DIAGNOSIS — I132 Hypertensive heart and chronic kidney disease with heart failure and with stage 5 chronic kidney disease, or end stage renal disease: Secondary | ICD-10-CM | POA: Insufficient documentation

## 2020-10-07 DIAGNOSIS — Z87891 Personal history of nicotine dependence: Secondary | ICD-10-CM | POA: Insufficient documentation

## 2020-10-07 DIAGNOSIS — Z79899 Other long term (current) drug therapy: Secondary | ICD-10-CM | POA: Diagnosis not present

## 2020-10-07 DIAGNOSIS — Z951 Presence of aortocoronary bypass graft: Secondary | ICD-10-CM | POA: Insufficient documentation

## 2020-10-07 DIAGNOSIS — J45909 Unspecified asthma, uncomplicated: Secondary | ICD-10-CM | POA: Diagnosis not present

## 2020-10-07 DIAGNOSIS — I120 Hypertensive chronic kidney disease with stage 5 chronic kidney disease or end stage renal disease: Secondary | ICD-10-CM | POA: Diagnosis not present

## 2020-10-07 DIAGNOSIS — Z7982 Long term (current) use of aspirin: Secondary | ICD-10-CM | POA: Insufficient documentation

## 2020-10-07 DIAGNOSIS — Z7901 Long term (current) use of anticoagulants: Secondary | ICD-10-CM | POA: Diagnosis not present

## 2020-10-07 DIAGNOSIS — I5032 Chronic diastolic (congestive) heart failure: Secondary | ICD-10-CM | POA: Diagnosis not present

## 2020-10-07 DIAGNOSIS — E1122 Type 2 diabetes mellitus with diabetic chronic kidney disease: Secondary | ICD-10-CM | POA: Diagnosis not present

## 2020-10-07 DIAGNOSIS — Z955 Presence of coronary angioplasty implant and graft: Secondary | ICD-10-CM | POA: Insufficient documentation

## 2020-10-07 DIAGNOSIS — R0682 Tachypnea, not elsewhere classified: Secondary | ICD-10-CM | POA: Insufficient documentation

## 2020-10-07 LAB — COMPREHENSIVE METABOLIC PANEL
ALT: 25 U/L (ref 0–44)
AST: 27 U/L (ref 15–41)
Albumin: 3.4 g/dL — ABNORMAL LOW (ref 3.5–5.0)
Alkaline Phosphatase: 78 U/L (ref 38–126)
Anion gap: 15 (ref 5–15)
BUN: 42 mg/dL — ABNORMAL HIGH (ref 8–23)
CO2: 24 mmol/L (ref 22–32)
Calcium: 8.9 mg/dL (ref 8.9–10.3)
Chloride: 99 mmol/L (ref 98–111)
Creatinine, Ser: 8.79 mg/dL — ABNORMAL HIGH (ref 0.44–1.00)
GFR, Estimated: 5 mL/min — ABNORMAL LOW (ref >60)
Glucose, Bld: 159 mg/dL — ABNORMAL HIGH (ref 70–99)
Potassium: 3.6 mmol/L (ref 3.5–5.1)
Sodium: 138 mmol/L (ref 135–145)
Total Bilirubin: 0.4 mg/dL (ref 0.3–1.2)
Total Protein: 7.5 g/dL (ref 6.5–8.1)

## 2020-10-07 LAB — C-REACTIVE PROTEIN: CRP: 4.9 mg/dL — ABNORMAL HIGH (ref <1.0)

## 2020-10-07 LAB — CBC WITH DIFFERENTIAL/PLATELET
Abs Immature Granulocytes: 0.08 10*3/uL — ABNORMAL HIGH (ref 0.00–0.07)
Basophils Absolute: 0 10*3/uL (ref 0.0–0.1)
Basophils Relative: 1 %
Eosinophils Absolute: 0.1 10*3/uL (ref 0.0–0.5)
Eosinophils Relative: 2 %
HCT: 43.1 % (ref 36.0–46.0)
Hemoglobin: 14 g/dL (ref 12.0–15.0)
Immature Granulocytes: 1 %
Lymphocytes Relative: 21 %
Lymphs Abs: 1.3 10*3/uL (ref 0.7–4.0)
MCH: 32.5 pg (ref 26.0–34.0)
MCHC: 32.5 g/dL (ref 30.0–36.0)
MCV: 100 fL (ref 80.0–100.0)
Monocytes Absolute: 0.6 10*3/uL (ref 0.1–1.0)
Monocytes Relative: 10 %
Neutro Abs: 4 10*3/uL (ref 1.7–7.7)
Neutrophils Relative %: 65 %
Platelets: 206 10*3/uL (ref 150–400)
RBC: 4.31 MIL/uL (ref 3.87–5.11)
RDW: 14.6 % (ref 11.5–15.5)
WBC: 6.1 10*3/uL (ref 4.0–10.5)
nRBC: 0 % (ref 0.0–0.2)

## 2020-10-07 LAB — TRIGLYCERIDES: Triglycerides: 257 mg/dL — ABNORMAL HIGH (ref <150)

## 2020-10-07 LAB — LACTATE DEHYDROGENASE: LDH: 145 U/L (ref 98–192)

## 2020-10-07 LAB — SARS CORONAVIRUS 2 BY RT PCR (HOSPITAL ORDER, PERFORMED IN ~~LOC~~ HOSPITAL LAB): SARS Coronavirus 2: POSITIVE — AB

## 2020-10-07 LAB — TROPONIN I (HIGH SENSITIVITY): Troponin I (High Sensitivity): 23 ng/L — ABNORMAL HIGH (ref <18)

## 2020-10-07 LAB — PROCALCITONIN: Procalcitonin: <0.1 ng/mL

## 2020-10-07 LAB — D-DIMER, QUANTITATIVE: D-Dimer, Quant: 1.81 ug/mL-FEU — ABNORMAL HIGH (ref 0.00–0.50)

## 2020-10-07 LAB — LACTIC ACID, PLASMA: Lactic Acid, Venous: 1.2 mmol/L (ref 0.5–1.9)

## 2020-10-07 LAB — FERRITIN: Ferritin: 505 ng/mL — ABNORMAL HIGH (ref 11–307)

## 2020-10-07 LAB — FIBRINOGEN: Fibrinogen: 570 mg/dL — ABNORMAL HIGH (ref 210–475)

## 2020-10-07 MED ORDER — SODIUM CHLORIDE 0.9% FLUSH: 3.0000 mL | Freq: Two times a day (BID) | INTRAVENOUS | Status: DC

## 2020-10-07 MED ORDER — SODIUM CHLORIDE 0.9 % IV SOLN: 250.0000 mL | INTRAVENOUS | Status: DC | PRN

## 2020-10-07 MED ORDER — SODIUM CHLORIDE 0.9% FLUSH: 3.0000 mL | INTRAVENOUS | Status: DC | PRN

## 2020-10-07 MED ORDER — ONDANSETRON HCL 4 MG/2ML IJ SOLN
4.0000 mg | Freq: Once | INTRAMUSCULAR | Status: AC
  Administered 2020-10-07: 4 mg via INTRAVENOUS
  Filled 2020-10-07: qty 2

## 2020-10-07 NOTE — ED Triage Notes (Signed)
Pt C/O shortness of breath and right sided rib pain. Pt states she was COVID positive last Wednesday 09/29/2020. Pt states she has not been to dialysis since last Friday.

## 2020-10-07 NOTE — ED Notes (Signed)
Ambulated pt. 50 feet. Pts. O2 remained 98% on RA.

## 2020-10-07 NOTE — Discharge Instructions (Addendum)
Go to dialysis tomorrow.

## 2020-10-07 NOTE — ED Provider Notes (Signed)
[facility] EMERGENCY DEPARTMENT Provider Note   CSN: [id number] Arrival date & time: 10/07/20  0818     History Chief Complaint  Patient presents with  . Shortness of Breath    Donna Howe is a 65 y.o. female.  Pt presents to the ED today with sob.  Pt said she's been sob for the past few days.  She was diagnosed with Covid on 1/12.  Sx started on 1/11.  She has had only 1 dose of the covid vaccine due to medical issues interfering with the ability to get the 2nd dose.  She is also a dialysis patient and has not gone to dialysis since Friday, 1/14.  She could not get there on the 17th due to the weather and said she did not have heat yesterday and had to stay home for the repair person.  No fevers.        Past Medical History:  Diagnosis Date  . Anemia   . Anxiety   . Asthma   . CAD (coronary artery disease)    Multivessel s/p CABG 2005, numerous PCIs since that time  . Carotid artery disease (HCC)    R CEA  . CHF (congestive heart failure) ([place])   . Chronic kidney disease (CKD), stage III (moderate) ([place])    in [place]  . Dyspnea    some  . Essential hypertension    no medications since starting dialysis  . Gout   . Heart murmur   . History of blood transfusion   . Hyperlipidemia   . Hypothyroidism   . Myocardial infarction ([place])   . PAD (peripheral artery disease) ([place])    Dr. Kellie Simmering  . Pneumonia 09/2019, 11/2019  . S/P angioplasty with stent- DES to [facility] and to LIMA to LAD with DES 04/09/18.   04/10/2018  . SBO (small bowel obstruction) ([place]) 2011   Status post lysis of adhesions & hernia repair  . Sinus bradycardia   . Thrombocytopenia, unspecified ([place])   . Type 2 diabetes mellitus ([place])   . Umbilical hernia     Patient Active Problem List   Diagnosis Date Noted  . Unspecified protein-calorie malnutrition ([place]) 07/05/2020  . Allergy, unspecified, initial encounter 06/30/2020  . Anaphylactic shock, unspecified, initial encounter 06/30/2020  .  Anemia in chronic kidney disease 06/30/2020  . Coagulation defect, unspecified ([place]) 06/30/2020  . Diarrhea, unspecified 06/30/2020  . Encounter for immunization 06/30/2020  . Iron deficiency anemia, unspecified 06/30/2020  . Pain, unspecified 06/30/2020  . Peripheral vascular disease ([place]) 06/30/2020  . Pruritus, unspecified 06/30/2020  . Secondary hyperparathyroidism of renal origin ([place]) 06/30/2020  . End stage renal disease ([place])   . SOB (shortness of breath)   . Hyperkalemia 06/18/2020  . Chronic diastolic HF (heart failure) ([place])   . Gastroesophageal reflux disease   . Acute renal failure superimposed on stage 5 chronic kidney disease, not on chronic dialysis ([place]) 06/13/2020  . Uncontrolled type 2 diabetes mellitus with hyperglycemia, with long-term current use of insulin ([place]) 06/13/2020  . Pneumonia 12/17/2019  . Cough variant asthma with component of UACS 11/20/2019  . Elevated troponin 10/02/2019  . Chest pain 10/01/2019  . Unstable angina ([place]) 04/18/2018  . Hypothyroidism 04/18/2018  . S/P angioplasty with stent- DES to [facility] and to LIMA to LAD with DES 04/09/18.   04/10/2018  . Angina pectoris ([place]) 04/05/2018  . Status post coronary artery stent placement   . ACS (acute coronary syndrome) ([place]) 05/31/2017  . Acute chest pain   .  NSTEMI (non-ST elevated myocardial infarction) ([place])   . History of coronary artery disease   . Diabetes ([place]) 10/28/2013  . Hypoglycemia associated with diabetes ([place]) 10/28/2013  . Thrombocytopenia, unspecified ([place]) 10/28/2013  . Hypokalemia 10/27/2013  . Syncope 10/26/2013  . Anemia 10/26/2013  . AKI (acute kidney injury) ([place]) 10/26/2013  . Fracture of toe of right foot 10/26/2013  . Umbilical hernia   . Carotid artery disease ([place])   . Occlusion and stenosis of carotid artery without mention of cerebral infarction 04/15/2013  . Hx of CABG   . Ejection fraction   . Atherosclerosis of native artery of extremity with intermittent  claudication ([place]) 02/11/2013  . Diabetes mellitus with renal manifestation ([place]) 01/21/2013  . Bradycardia 01/03/2013  . Chronic kidney disease (CKD), stage III (moderate) (HCC)   . Gout   . PROTEINURIA, MILD 01/18/2010  . Class 2 obesity 08/26/2009  . Asthma 08/26/2009  . Hyperlipidemia 03/31/2007  . Essential hypertension 03/31/2007  . CAD (coronary artery disease) 03/31/2007    Past Surgical History:  Procedure Laterality Date  . AORTIC ARCH ANGIOGRAPHY N/A 08/02/2020   Procedure: AORTIC ARCH ANGIOGRAPHY;  Surgeon: Waynetta Sandy, MD;  Location: [place] CV [facility];  Service: Cardiovascular;  Laterality: N/A;  Lt upper extermity  . AV FISTULA PLACEMENT Left 06/29/2020   Procedure: LEFT ARM ARTERIOVENOUS (AV) FISTULA;  Surgeon: Waynetta Sandy, MD;  Location: [place];  Service: Vascular;  Laterality: Left;  ARM  . CESAREAN SECTION  1984  . CHOLECYSTECTOMY  2010  . CORONARY ARTERY BYPASS GRAFT  2005  . CORONARY BALLOON ANGIOPLASTY N/A 05/31/2017   Procedure: CORONARY BALLOON ANGIOPLASTY;  Surgeon: Jettie Booze, MD;  Location: [place] CV [facility];  Service: Cardiovascular;  Laterality: N/A;  . CORONARY STENT INTERVENTION N/A 05/31/2017   Procedure: CORONARY STENT INTERVENTION;  Surgeon: Jettie Booze, MD;  Location: [place] CV [facility];  Service: Cardiovascular;  Laterality: N/A;  . CORONARY STENT INTERVENTION N/A 04/09/2018   Procedure: CORONARY STENT INTERVENTION;  Surgeon: Jettie Booze, MD;  Location: [place] [facility];  Service: Cardiovascular;  Laterality: N/A;  SVG RCA  . ENDARTERECTOMY Right 04/18/2013   Procedure: ENDARTERECTOMY CAROTID;  Surgeon: Mal Misty, MD;  Location: [place];  Service: Vascular;  Laterality: Right;  . HERNIA REPAIR  1989  . Incisional hernia repair x2  03/04/2010   Laparoscopic with 35cm mesh by Dr Ronnald Collum  . IR FLUORO GUIDE CV LINE RIGHT  06/21/2020  . IR US GUIDE VASC ACCESS RIGHT  06/21/2020  . LEFT HEART  CATH AND CORS/GRAFTS ANGIOGRAPHY N/A 05/31/2017   Procedure: LEFT HEART CATH AND CORS/GRAFTS ANGIOGRAPHY;  Surgeon: Jettie Booze, MD;  Location: [place] [facility];  Service: Cardiovascular;  Laterality: N/A;  . LEFT HEART CATH AND CORS/GRAFTS ANGIOGRAPHY N/A 04/08/2018   Procedure: LEFT HEART CATH AND CORS/GRAFTS ANGIOGRAPHY;  Surgeon: Jettie Booze, MD;  Location: [place] CV [facility];  Service: Cardiovascular;  Laterality: N/A;  . LEFT HEART CATH AND CORS/GRAFTS ANGIOGRAPHY N/A 06/22/2020   Procedure: LEFT HEART CATH AND CORS/GRAFTS ANGIOGRAPHY;  Surgeon: Belva Crome, MD;  Location: [place] [facility];  Service: Cardiovascular;  Laterality: N/A;  . LEFT HEART CATHETERIZATION WITH CORONARY ANGIOGRAM N/A 12/19/2012   Procedure: LEFT HEART CATHETERIZATION WITH CORONARY ANGIOGRAM;  Surgeon: Josue Hector, MD;  Location: [facility] CATH LAB;  Service: Cardiovascular;  Laterality: N/A;  . LEFT HEART CATHETERIZATION WITH CORONARY/GRAFT ANGIOGRAM N/A 04/19/2013   Procedure: LEFT HEART CATHETERIZATION  WITH Beatrix Fetters;  Surgeon: Lorretta Harp, MD;  Location: [facility] CATH LAB;  Service: Cardiovascular;  Laterality: N/A;  . LIGATION OF ARTERIOVENOUS  FISTULA Left 09/15/2020   Procedure: LIGATION OF LEFT ARM ARTERIOVENOUS  FISTULA;  Surgeon: Waynetta Sandy, MD;  Location: [place];  Service: Vascular;  Laterality: Left;  . PATCH ANGIOPLASTY Right 04/18/2013   Procedure: PATCH ANGIOPLASTY Right Internal Carotid Artery;  Surgeon: Mal Misty, MD;  Location: [place];  Service: Vascular;  Laterality: Right;  . PERCUTANEOUS CORONARY STENT INTERVENTION (PCI-S) Right 12/19/2012   Procedure: PERCUTANEOUS CORONARY STENT INTERVENTION (PCI-S);  Surgeon: Josue Hector, MD;  Location[facility] C[facility] LAB;  Service: Cardiovascular;  Laterality: Right;  . PERIPHERAL VASCULAR INTERVENTION Left 08/02/2020   Procedure: PERIPHERAL VASCULAR INTERVENTION;  Surgeon: Waynetta Sandy, MD;  Location: [place]  CV LAB;  Service: Cardiovascular;  Laterality: Left;  Left subclavian  . SHOULDER SURGERY       OB History   No obstetric history on file.     Family History  Problem Relation Age of Onset  . Diabetes Mother   . Heart disease Mother        before age 63  . Hyperlipidemia Mother   . Hypertension Mother   . Thyroid disease Father   . Hypertension Father   . AAA (abdominal aortic aneurysm) Father   . Heart disease Brother        before age 62  . Hypertension Brother   . Hyperlipidemia Son   . Hypertension Son     Social History   Tobacco Use  . Smoking status: Former Smoker    Packs/day: 1.00    Years: 20.00    Pack years: 20.00    Types: Cigarettes    Quit date: 12/10/2012    Years since quitting: 7.8  . Smokeless tobacco: Never Used  Vaping Use  . Vaping Use: Never used  Substance Use Topics  . Alcohol use: No    Alcohol/week: 0.0 standard drinks  . Drug use: No    Home Medications Prior to Admission medications   Medication Sig Start Date End Date Taking? Authorizing Provider  Albuterol Sulfate 108 (90 Base) MCG/ACT AEPB Inhale 2 puffs into the lungs every 4 (four) hours as needed (Shortness of breath).    Yes [provider]  allopurinol (ZYLOPRIM) 100 MG tablet Take one tablet after hemodialysis, on Tuesday, Thursday and Saturday. Patient taking differently: Take 100 mg by mouth Every Tuesday,Thursday,and Saturday with dialysis. After dialysis 06/30/20  Yes Arrien, Jimmy Picket, MD  ALPRAZolam Duanne Moron) 0.5 MG tablet Take 0.25-0.5 mg by mouth See admin instructions. Take 0.25 in the morning and evening and 0.5 mg at bedtime   Yes [provider]  aspirin EC 81 MG tablet Take 81 mg by mouth daily.    Yes [provider]  atorvastatin (LIPITOR) 80 MG tablet Take 1 tablet (80 mg total) by mouth every evening. Patient taking differently: Take 80 mg by mouth at bedtime. 12/19/19  Yes Johnson, Clanford L, MD  AURYXIA 1 GM 210 MG(Fe) tablet  Take 420 mg by mouth 3 (three) times daily. 07/20/20  Yes [provider]  clopidogrel (PLAVIX) 75 MG tablet Take 1 tablet (75 mg total) by mouth daily. 08/27/18  Yes Satira Sark, MD  diclofenac Sodium (VOLTAREN) 1 % GEL Apply 2 g topically 4 (four) times daily as needed (apply to right foot big toe as needed for pain.). 06/30/20  Yes Arrien, Jimmy Picket,  MD  gabapentin (NEURONTIN) 400 MG capsule Take 1 capsule (400 mg total) by mouth 2 (two) times daily. 06/17/20  Yes Barton Dubois, MD  ipratropium (ATROVENT HFA) 17 MCG/ACT inhaler Inhale 2 puffs into the lungs every 4 (four) hours as needed for wheezing.    Yes [provider]  levothyroxine (SYNTHROID) 112 MCG tablet Take 112 mcg by mouth daily before breakfast.   Yes [provider]  NOVOLIN 70/30 FLEXPEN (70-30) 100 UNIT/ML KwikPen Inject 24 Units into the skin in the morning and at bedtime. 06/30/20  Yes [provider]  ondansetron (ZOFRAN) 8 MG tablet Take 8 mg by mouth every 8 (eight) hours as needed for nausea. 07/12/20  Yes [provider]  pantoprazole (PROTONIX) 40 MG tablet Take 1 tablet (40 mg total) by mouth 2 (two) times daily. 06/17/20  Yes Barton Dubois, MD  ranolazine (RANEXA) 500 MG 12 hr tablet Take 1 tablet (500 mg total) by mouth 2 (two) times daily. 07/09/20  Yes Verta Ellen., NP  Vitamin D, Ergocalciferol, (DRISDOL) 1.25 MG (50000 UNIT) CAPS capsule Take 50,000 Units by mouth every Sunday.  12/31/19  Yes [provider]  nitroGLYCERIN (NITROSTAT) 0.4 MG SL tablet Place 0.4 mg under the tongue every 5 (five) minutes x 3 doses as needed for chest pain. Not to exceed 3 in 15 minute time frame    [provider]  oxyCODONE-acetaminophen (PERCOCET/ROXICET) 5-325 MG tablet Take 1 tablet by mouth every 6 (six) hours as needed for severe pain. Patient not taking: No sig reported 09/15/20   Gabriel Earing, PA-C    Allergies    Penicillins  Review of  Systems   Review of Systems  Respiratory: Positive for shortness of breath.   All other systems reviewed and are negative.   Physical Exam Updated Vital Signs BP (!) 185/81   Pulse 74   Temp 98.7 F (37.1 C) (Oral)   Resp 14   SpO2 95%   Physical Exam Vitals and nursing note reviewed.  Constitutional:      Appearance: She is well-developed.  HENT:     Head: Normocephalic and atraumatic.     Mouth/Throat:     Mouth: Mucous membranes are moist.     Pharynx: Oropharynx is clear.  Eyes:     Extraocular Movements: Extraocular movements intact.     Pupils: Pupils are equal, round, and reactive to light.  Cardiovascular:     Rate and Rhythm: Normal rate and regular rhythm.  Pulmonary:     Effort: Tachypnea present.     Breath sounds: Rhonchi present.  Abdominal:     General: Bowel sounds are normal.     Palpations: Abdomen is soft.  Musculoskeletal:        General: Normal range of motion.     Cervical back: Normal range of motion and neck supple.  Skin:    General: Skin is warm.     Capillary Refill: Capillary refill takes less than 2 seconds.  Neurological:     General: No focal deficit present.     Mental Status: She is alert and oriented to person, place, and time.  Psychiatric:        Mood and Affect: Mood normal.        Behavior: Behavior normal.     ED Results / Procedures / Treatments   Labs (all labs ordered are listed, but only abnormal results are displayed) Labs Reviewed  SARS CORONAVIRUS 2 BY RT PCR ([place]  [place] [facility]) - Abnormal; Notable for the following components:      Result Value   SARS Coronavirus 2 POSITIVE (*)    All other components within normal limits  CBC WITH DIFFERENTIAL/PLATELET - Abnormal; Notable for the following components:   Abs Immature Granulocytes 0.08 (*)    All other components within normal limits  COMPREHENSIVE METABOLIC PANEL - Abnormal; Notable for the following components:   Glucose,  Bld 159 (*)    BUN 42 (*)    Creatinine, Ser 8.79 (*)    Albumin 3.4 (*)    GFR, Estimated 5 (*)    All other components within normal limits  D-DIMER, QUANTITATIVE (NOT AT [facility]) - Abnormal; Notable for the following components:   D-Dimer, Quant 1.81 (*)    All other components within normal limits  FERRITIN - Abnormal; Notable for the following components:   Ferritin 505 (*)    All other components within normal limits  TRIGLYCERIDES - Abnormal; Notable for the following components:   Triglycerides 257 (*)    All other components within normal limits  FIBRINOGEN - Abnormal; Notable for the following components:   Fibrinogen 570 (*)    All other components within normal limits  C-REACTIVE PROTEIN - Abnormal; Notable for the following components:   CRP 4.9 (*)    All other components within normal limits  TROPONIN I (HIGH SENSITIVITY) - Abnormal; Notable for the following components:   Troponin I (High Sensitivity) 23 (*)    All other components within normal limits  CULTURE, BLOOD (ROUTINE X 2)  CULTURE, BLOOD (ROUTINE X 2)  LACTIC ACID, PLASMA  PROCALCITONIN  LACTATE DEHYDROGENASE  LACTIC ACID, PLASMA  TROPONIN I (HIGH SENSITIVITY)    EKG EKG Interpretation  Date/Time:  Thursday October 07 2020 09:13:24 EST Ventricular Rate:  65 PR Interval:    QRS Duration: 110 QT Interval:  473 QTC Calculation: 492 R Axis:   78 Text Interpretation: Sinus rhythm Repol abnrm suggests ischemia, anterolateral t wave changes anteriorally and  laterally are new Confirmed by Isla Pence [phone]) on 10/07/2020 9:27:52 AM   Radiology DG Chest Port 1 View  Result Date: 10/07/2020 CLINICAL DATA:  Shortness of breath. EXAM: PORTABLE CHEST 1 VIEW COMPARISON:  06/18/2020 FINDINGS: 0934 hours. The cardio pericardial silhouette is enlarged. The lungs are clear without focal pneumonia, edema, pneumothorax or pleural effusion. Bones are diffusely demineralized. Right IJ central line tip overlies  the upper right atrium. Telemetry leads overlie the chest. IMPRESSION: No acute cardiopulmonary findings. Electronically Signed   By: Misty Stanley M.D.   On: 10/07/2020 09:45    Procedures Procedures (including critical care time)  Medications Ordered in ED Medications  sodium chloride flush (NS) 0.9 % injection 3 mL (3 mLs Intravenous Not Given 10/07/20 0949)  sodium chloride flush (NS) 0.9 % injection 3 mL (has no administration in time range)  0.9 %  sodium chloride infusion (has no administration in time range)  ondansetron (ZOFRAN) injection 4 mg (4 mg Intravenous Given 10/07/20 1145)    ED Course  I have reviewed the triage vital signs and the nursing notes.  Pertinent labs & imaging results that were available during my care of the patient were reviewed by me and considered in my medical decision making (see chart for details).    MDM Rules/Calculators/A&P                           Pt's CXR  is clear.  No pneumonia or pulmonary edema.  Pt is able to ambulate with her pulse ox staying at 98%.  Her K is nl.  Pt is likely sob because she's missed 2 dialysis sessions.  However, she does not need emergent dialysis.  I did call her dialysis center and unfortunately, they can't get her in today.  They are only dialyzing Covid patients after hours on MWF and do not have staff for after hours dialysis on Tues and Thurs.  Pt is stable for d/c home.  She is told to return if worse and to make sure she goes to dialysis tomorrow.  Donna Howe was evaluated in Emergency Department on 10/07/2020 for the symptoms described in the history of present illness. She was evaluated in the context of the global COVID-19 pandemic, which necessitated consideration that the patient might be at risk for infection with the SARS-CoV-2 virus that causes COVID-19. Institutional protocols and algorithms that pertain to the evaluation of patients at risk for COVID-19 are in a state of rapid change based on  information released by regulatory bodies including the CDC and federal and state organizations. These policies and algorithms were followed during the patient's care in the ED.  Final Clinical Impression(s) / ED Diagnoses Final diagnoses:  [id number]  ESRD on hemodialysis [facility])    Rx / DC Orders ED Discharge Orders    None       Isla Pence, MD 10/07/20 1217

## 2020-10-08 DIAGNOSIS — D689 Coagulation defect, unspecified: Secondary | ICD-10-CM | POA: Diagnosis not present

## 2020-10-08 DIAGNOSIS — Z992 Dependence on renal dialysis: Secondary | ICD-10-CM | POA: Diagnosis not present

## 2020-10-08 DIAGNOSIS — N186 End stage renal disease: Secondary | ICD-10-CM | POA: Diagnosis not present

## 2020-10-08 DIAGNOSIS — E039 Hypothyroidism, unspecified: Secondary | ICD-10-CM | POA: Diagnosis not present

## 2020-10-08 DIAGNOSIS — E1122 Type 2 diabetes mellitus with diabetic chronic kidney disease: Secondary | ICD-10-CM | POA: Diagnosis not present

## 2020-10-08 DIAGNOSIS — N2581 Secondary hyperparathyroidism of renal origin: Secondary | ICD-10-CM | POA: Diagnosis not present

## 2020-10-11 DIAGNOSIS — D689 Coagulation defect, unspecified: Secondary | ICD-10-CM | POA: Diagnosis not present

## 2020-10-11 DIAGNOSIS — N186 End stage renal disease: Secondary | ICD-10-CM | POA: Diagnosis not present

## 2020-10-11 DIAGNOSIS — N2581 Secondary hyperparathyroidism of renal origin: Secondary | ICD-10-CM | POA: Diagnosis not present

## 2020-10-11 DIAGNOSIS — E1122 Type 2 diabetes mellitus with diabetic chronic kidney disease: Secondary | ICD-10-CM | POA: Diagnosis not present

## 2020-10-11 DIAGNOSIS — E039 Hypothyroidism, unspecified: Secondary | ICD-10-CM | POA: Diagnosis not present

## 2020-10-11 DIAGNOSIS — Z992 Dependence on renal dialysis: Secondary | ICD-10-CM | POA: Diagnosis not present

## 2020-10-12 ENCOUNTER — Ambulatory Visit: Payer: Medicare HMO | Admitting: Cardiology

## 2020-10-12 LAB — CULTURE, BLOOD (ROUTINE X 2)
Culture: NO GROWTH
Culture: NO GROWTH
Special Requests: ADEQUATE
Special Requests: ADEQUATE

## 2020-10-13 DIAGNOSIS — N2581 Secondary hyperparathyroidism of renal origin: Secondary | ICD-10-CM | POA: Diagnosis not present

## 2020-10-13 DIAGNOSIS — E1122 Type 2 diabetes mellitus with diabetic chronic kidney disease: Secondary | ICD-10-CM | POA: Diagnosis not present

## 2020-10-13 DIAGNOSIS — Z992 Dependence on renal dialysis: Secondary | ICD-10-CM | POA: Diagnosis not present

## 2020-10-13 DIAGNOSIS — N186 End stage renal disease: Secondary | ICD-10-CM | POA: Diagnosis not present

## 2020-10-13 DIAGNOSIS — D689 Coagulation defect, unspecified: Secondary | ICD-10-CM | POA: Diagnosis not present

## 2020-10-13 DIAGNOSIS — E039 Hypothyroidism, unspecified: Secondary | ICD-10-CM | POA: Diagnosis not present

## 2020-10-14 ENCOUNTER — Encounter (HOSPITAL_COMMUNITY): Payer: Medicare HMO

## 2020-10-15 ENCOUNTER — Encounter (HOSPITAL_COMMUNITY): Payer: Medicare HMO

## 2020-10-15 DIAGNOSIS — D689 Coagulation defect, unspecified: Secondary | ICD-10-CM | POA: Diagnosis not present

## 2020-10-15 DIAGNOSIS — Z992 Dependence on renal dialysis: Secondary | ICD-10-CM | POA: Diagnosis not present

## 2020-10-15 DIAGNOSIS — E039 Hypothyroidism, unspecified: Secondary | ICD-10-CM | POA: Diagnosis not present

## 2020-10-15 DIAGNOSIS — N2581 Secondary hyperparathyroidism of renal origin: Secondary | ICD-10-CM | POA: Diagnosis not present

## 2020-10-15 DIAGNOSIS — N186 End stage renal disease: Secondary | ICD-10-CM | POA: Diagnosis not present

## 2020-10-15 DIAGNOSIS — E1122 Type 2 diabetes mellitus with diabetic chronic kidney disease: Secondary | ICD-10-CM | POA: Diagnosis not present

## 2020-10-16 DIAGNOSIS — E1165 Type 2 diabetes mellitus with hyperglycemia: Secondary | ICD-10-CM | POA: Diagnosis not present

## 2020-10-16 DIAGNOSIS — I10 Essential (primary) hypertension: Secondary | ICD-10-CM | POA: Diagnosis not present

## 2020-10-16 DIAGNOSIS — Z87891 Personal history of nicotine dependence: Secondary | ICD-10-CM | POA: Diagnosis not present

## 2020-10-16 DIAGNOSIS — Z794 Long term (current) use of insulin: Secondary | ICD-10-CM | POA: Diagnosis not present

## 2020-10-16 DIAGNOSIS — I70201 Unspecified atherosclerosis of native arteries of extremities, right leg: Secondary | ICD-10-CM | POA: Diagnosis not present

## 2020-10-18 DIAGNOSIS — E039 Hypothyroidism, unspecified: Secondary | ICD-10-CM | POA: Diagnosis not present

## 2020-10-18 DIAGNOSIS — Z992 Dependence on renal dialysis: Secondary | ICD-10-CM | POA: Diagnosis not present

## 2020-10-18 DIAGNOSIS — N2581 Secondary hyperparathyroidism of renal origin: Secondary | ICD-10-CM | POA: Diagnosis not present

## 2020-10-18 DIAGNOSIS — N186 End stage renal disease: Secondary | ICD-10-CM | POA: Diagnosis not present

## 2020-10-18 DIAGNOSIS — D689 Coagulation defect, unspecified: Secondary | ICD-10-CM | POA: Diagnosis not present

## 2020-10-18 DIAGNOSIS — E1122 Type 2 diabetes mellitus with diabetic chronic kidney disease: Secondary | ICD-10-CM | POA: Diagnosis not present

## 2020-10-20 ENCOUNTER — Ambulatory Visit (INDEPENDENT_AMBULATORY_CARE_PROVIDER_SITE_OTHER): Payer: Medicare HMO | Admitting: Physician Assistant

## 2020-10-20 ENCOUNTER — Other Ambulatory Visit: Payer: Self-pay

## 2020-10-20 ENCOUNTER — Ambulatory Visit (HOSPITAL_COMMUNITY)
Admission: RE | Admit: 2020-10-20 | Discharge: 2020-10-20 | Disposition: A | Discharge status: Home / Self Care | Payer: Medicare HMO | Source: Ambulatory Visit | Attending: Vascular Surgery | Admitting: Vascular Surgery

## 2020-10-20 VITALS — BP 186/81 | HR 78 | Temp 97.3°F | Resp 16 | Ht 66.0 in | Wt 198.0 lb

## 2020-10-20 DIAGNOSIS — I6523 Occlusion and stenosis of bilateral carotid arteries: Secondary | ICD-10-CM | POA: Insufficient documentation

## 2020-10-20 DIAGNOSIS — Z992 Dependence on renal dialysis: Secondary | ICD-10-CM | POA: Diagnosis not present

## 2020-10-20 DIAGNOSIS — Z23 Encounter for immunization: Secondary | ICD-10-CM | POA: Diagnosis not present

## 2020-10-20 DIAGNOSIS — E1122 Type 2 diabetes mellitus with diabetic chronic kidney disease: Secondary | ICD-10-CM | POA: Diagnosis not present

## 2020-10-20 DIAGNOSIS — N186 End stage renal disease: Secondary | ICD-10-CM

## 2020-10-20 DIAGNOSIS — N2581 Secondary hyperparathyroidism of renal origin: Secondary | ICD-10-CM | POA: Diagnosis not present

## 2020-10-20 DIAGNOSIS — D689 Coagulation defect, unspecified: Secondary | ICD-10-CM | POA: Diagnosis not present

## 2020-10-20 DIAGNOSIS — I771 Stricture of artery: Secondary | ICD-10-CM

## 2020-10-20 NOTE — Progress Notes (Signed)
Established Dialysis Access   History of Present Illness   Donna Howe is a 65 y.o. (1956-06-26) female who presents for re-evaluation for permanent access.  The patient is right hand dominant.  Previous access procedures have been completed in the left arm. She previously had a left arm cephalic vein fistula which was placed on 06/29/20 by Dr. Donzetta Matters. She underwent stenting of her left subclavian artery and DCB angioplasty of her axillary artery but had persistent pain in her left hand. Subsequently she had her left av fistula ligated by Dr. Donzetta Matters on 09/15/20.  She presents today with duplex of her left subclavian artery stent and to discuss new access placement in her right upper extremity. She says she is still having weakness in her left hand as well as tingling, numbness and sometimes a burning sensation when she skin is touched. She overall feels that her symptoms are better and she no longer is having continuous coldness in her left hand however her current symptoms do still interfere with her daily use of the left arm. She additionally has noticed a " knot" at the surgical site. She said this presented shortly after surgery. It is not overly tender she just was concerned about it. Her incisions have otherwise healed well.   She currently is dialyzing via a right IJ TDC on TTS at the [place] location  The patient's PMH, PSH, SH, and FamHx were reviewed and are unchanged from her prior visit on 08/27/20  Current Outpatient Medications  Medication Sig Dispense Refill  . Albuterol Sulfate 108 (90 Base) MCG/ACT AEPB Inhale 2 puffs into the lungs every 4 (four) hours as needed (Shortness of breath).     Marland Kitchen allopurinol (ZYLOPRIM) 100 MG tablet Take one tablet after hemodialysis, on Tuesday, Thursday and Saturday. (Patient taking differently: Take 100 mg by mouth Every Tuesday,Thursday,and Saturday with dialysis. After dialysis) 30 tablet 11  . ALPRAZolam (XANAX) 0.5 MG tablet Take  0.25-0.5 mg by mouth See admin instructions. Take 0.25 in the morning and evening and 0.5 mg at bedtime    . aspirin EC 81 MG tablet Take 81 mg by mouth daily.     Marland Kitchen atorvastatin (LIPITOR) 80 MG tablet Take 1 tablet (80 mg total) by mouth every evening. (Patient taking differently: Take 80 mg by mouth at bedtime.)    . AURYXIA 1 GM 210 MG(Fe) tablet Take 420 mg by mouth 3 (three) times daily.    . clopidogrel (PLAVIX) 75 MG tablet Take 1 tablet (75 mg total) by mouth daily. 90 tablet 3  . diclofenac Sodium (VOLTAREN) 1 % GEL Apply 2 g topically 4 (four) times daily as needed (apply to right foot big toe as needed for pain.). 50 g 0  . gabapentin (NEURONTIN) 400 MG capsule Take 1 capsule (400 mg total) by mouth 2 (two) times daily.    Marland Kitchen ipratropium (ATROVENT HFA) 17 MCG/ACT inhaler Inhale 2 puffs into the lungs every 4 (four) hours as needed for wheezing.     Marland Kitchen levothyroxine (SYNTHROID) 112 MCG tablet Take 112 mcg by mouth daily before breakfast.    . nitroGLYCERIN (NITROSTAT) 0.4 MG SL tablet Place 0.4 mg under the tongue every 5 (five) minutes x 3 doses as needed for chest pain. Not to exceed 3 in 15 minute time frame    . NOVOLIN 70/30 FLEXPEN (70-30) 100 UNIT/ML KwikPen Inject 24 Units into the skin in the morning and at bedtime.    . ondansetron (ZOFRAN) 8 MG tablet  Take 8 mg by mouth every 8 (eight) hours as needed for nausea.    . pantoprazole (PROTONIX) 40 MG tablet Take 1 tablet (40 mg total) by mouth 2 (two) times daily. 60 tablet 1  . ranolazine (RANEXA) 500 MG 12 hr tablet Take 1 tablet (500 mg total) by mouth 2 (two) times daily. 60 tablet 1  . Vitamin D, Ergocalciferol, (DRISDOL) 1.25 MG (50000 UNIT) CAPS capsule Take 50,000 Units by mouth every Sunday.     Marland Kitchen oxyCODONE-acetaminophen (PERCOCET/ROXICET) 5-325 MG tablet Take 1 tablet by mouth every 6 (six) hours as needed for severe pain. (Patient not taking: No sig reported) 10 tablet 0   No current facility-administered medications for  this visit.    On ROS today: negative unless stated in HPI   Physical Examination   Vitals:   10/20/20 1015 10/20/20 1018  BP: (!) 200/96 (!) 186/81  Pulse: 78 78  Resp: 16   Temp: (!) 97.3 F (36.3 C)   TempSrc: Temporal   SpO2: 98%   Weight: 198 lb (89.8 kg)   Height: 5\' 6"  (1.676 m)    Body mass index is 31.96 kg/m.  General Well appearing, well nourished not in any acute distress  Pulmonary Non labored  Cardiac Regular rate and rhythm  Vascular Vessel Right Left  Radial Palpable Palpable  Brachial Palpable Palpable  Ulnar Palpable Faintly palpable    Musculo- skeletal Left AC incisions well healed. She does have a small knot between her incision lines. There is some pulsatility to the palpable mass. No tenderness to palpation. No overlying skin changes M/S 5/5 throughout  , Extremities without ischemic changes  . Bilateral hands well perfused and warm  Neurologic A&O; CN grossly intact     Non-invasive Vascular Imaging   VAS Carotid Duplex ( 10/20/20): Summary:  Right Carotid: Velocities in the right ICA are consistent with a 40-59% stenosis. The ECA appears <50% stenosed.   Left Carotid: Velocities in the left ICA are consistent with a 1-39% stenosis. The ECA appears <50% stenosed.   Vertebrals: Bilateral vertebral arteries demonstrate antegrade flow.  Subclavians: Left subclavian artery stent appears patent with increased velocity in the lateral segment (PSV 359 cm/s) . Normal flow hemodynamics were seen in the right subclavian artery.  BUE Vein Mapping  (06/21/20):  +-----------------+-------------+----------+---------+  Right Cephalic  Diameter (cm)Depth (cm)Findings   +-----------------+-------------+----------+---------+  Shoulder       0.43     2.00         +-----------------+-------------+----------+---------+  Prox upper arm    0.29     2.10         +-----------------+-------------+----------+---------+  Mid  upper arm    0.25     1.30         +-----------------+-------------+----------+---------+  Dist upper arm    0.33     0.94         +-----------------+-------------+----------+---------+  Antecubital fossa  0.35     0.55  branching  +-----------------+-------------+----------+---------+  Prox forearm     0.22     0.89         +-----------------+-------------+----------+---------+  Mid forearm     0.22     0.64         +-----------------+-------------+----------+---------+  Dist forearm     0.15     0.47         +-----------------+-------------+----------+---------+   +-----------------+-------------+----------+--------------+  Right Basilic  Diameter (cm)Depth (cm)  Findings    +-----------------+-------------+----------+--------------+  Shoulder  0.47     1.34           +-----------------+-------------+----------+--------------+  Mid upper arm    0.29     1.50           +-----------------+-------------+----------+--------------+  Dist upper arm    0.18     1.21           +-----------------+-------------+----------+--------------+  Antecubital fossa  0.16     1.30   branching    +-----------------+-------------+----------+--------------+  Prox forearm     0.11     0.51           +-----------------+-------------+----------+--------------+  Mid forearm               not visualized  +-----------------+-------------+----------+--------------+  Distal forearm              not visualized  +-----------------+-------------+----------+--------------+     Medical Decision Making   Gavrielle Streck Gammel is a 65 y.o. female who presents for re-evaluation for permanent access.  The patient is right hand dominant. She previously had a left arm cephalic vein fistula  which was placed on 06/29/20 by Dr. Donzetta Matters. She underwent stenting of her left subclavian artery and DCB angioplasty of her axillary artery but had persistent pain in her left hand. Subsequently she had to have her left AV fistula ligated by Dr. Donzetta Matters on 09/15/20. Her duplex today does show some elevated velocities in the left subclavian stent. She does additionally have a discrepancy between her blood pressures in the right and left upper extremity of 20 mmHg and she continues to have left upper extremity steal like symptoms. However I do feel that some of her symptoms are neuropathic in nature. Hopefully some of this will improve with time.   I will have her follow up with Dr. Donzetta Matters to address any further intervention need due to in stent restenosis in her left subclavian. I am also concerned about a small pseudoaneurysm in her left upper extremity. I will schedule her for left upper extremity arterial duplex to evaluate this  She will need new access in her right upper extremity considering development of immediate steal symptoms in left upper extremity following fistula placement  Her previous vein mapping from October showed borderline right Cephalic and basilic veins. I would like to repeat her study to see if there is any change and whether she will have adequate conduit for fistula vs. needing an AV graft  She will return in 1-2 weeks to see Dr. Donzetta Matters with upper extremity vein mapping and left upper extremity arterial duplex   Karoline Caldwell, PA-C [facility] Office: [phone]  Clinic MD: On call MD Dr. Trula Slade

## 2020-10-21 ENCOUNTER — Other Ambulatory Visit: Payer: Self-pay

## 2020-10-21 DIAGNOSIS — N186 End stage renal disease: Secondary | ICD-10-CM

## 2020-10-23 DIAGNOSIS — N2581 Secondary hyperparathyroidism of renal origin: Secondary | ICD-10-CM | POA: Diagnosis not present

## 2020-10-23 DIAGNOSIS — D689 Coagulation defect, unspecified: Secondary | ICD-10-CM | POA: Diagnosis not present

## 2020-10-23 DIAGNOSIS — N186 End stage renal disease: Secondary | ICD-10-CM | POA: Diagnosis not present

## 2020-10-23 DIAGNOSIS — Z992 Dependence on renal dialysis: Secondary | ICD-10-CM | POA: Diagnosis not present

## 2020-10-23 DIAGNOSIS — Z23 Encounter for immunization: Secondary | ICD-10-CM | POA: Diagnosis not present

## 2020-10-23 DIAGNOSIS — E1122 Type 2 diabetes mellitus with diabetic chronic kidney disease: Secondary | ICD-10-CM | POA: Diagnosis not present

## 2020-10-26 DIAGNOSIS — D689 Coagulation defect, unspecified: Secondary | ICD-10-CM | POA: Diagnosis not present

## 2020-10-26 DIAGNOSIS — E1122 Type 2 diabetes mellitus with diabetic chronic kidney disease: Secondary | ICD-10-CM | POA: Diagnosis not present

## 2020-10-26 DIAGNOSIS — N186 End stage renal disease: Secondary | ICD-10-CM | POA: Diagnosis not present

## 2020-10-26 DIAGNOSIS — Z992 Dependence on renal dialysis: Secondary | ICD-10-CM | POA: Diagnosis not present

## 2020-10-26 DIAGNOSIS — N2581 Secondary hyperparathyroidism of renal origin: Secondary | ICD-10-CM | POA: Diagnosis not present

## 2020-10-26 DIAGNOSIS — Z23 Encounter for immunization: Secondary | ICD-10-CM | POA: Diagnosis not present

## 2020-10-27 DIAGNOSIS — I10 Essential (primary) hypertension: Secondary | ICD-10-CM | POA: Diagnosis not present

## 2020-10-27 DIAGNOSIS — E1165 Type 2 diabetes mellitus with hyperglycemia: Secondary | ICD-10-CM | POA: Diagnosis not present

## 2020-10-27 DIAGNOSIS — E782 Mixed hyperlipidemia: Secondary | ICD-10-CM | POA: Diagnosis not present

## 2020-10-27 DIAGNOSIS — E7849 Other hyperlipidemia: Secondary | ICD-10-CM | POA: Diagnosis not present

## 2020-10-27 DIAGNOSIS — E7800 Pure hypercholesterolemia, unspecified: Secondary | ICD-10-CM | POA: Diagnosis not present

## 2020-10-27 DIAGNOSIS — E039 Hypothyroidism, unspecified: Secondary | ICD-10-CM | POA: Diagnosis not present

## 2020-10-27 DIAGNOSIS — E7801 Familial hypercholesterolemia: Secondary | ICD-10-CM | POA: Diagnosis not present

## 2020-10-28 DIAGNOSIS — D689 Coagulation defect, unspecified: Secondary | ICD-10-CM | POA: Diagnosis not present

## 2020-10-28 DIAGNOSIS — Z992 Dependence on renal dialysis: Secondary | ICD-10-CM | POA: Diagnosis not present

## 2020-10-28 DIAGNOSIS — E1122 Type 2 diabetes mellitus with diabetic chronic kidney disease: Secondary | ICD-10-CM | POA: Diagnosis not present

## 2020-10-28 DIAGNOSIS — N2581 Secondary hyperparathyroidism of renal origin: Secondary | ICD-10-CM | POA: Diagnosis not present

## 2020-10-28 DIAGNOSIS — Z23 Encounter for immunization: Secondary | ICD-10-CM | POA: Diagnosis not present

## 2020-10-28 DIAGNOSIS — N186 End stage renal disease: Secondary | ICD-10-CM | POA: Diagnosis not present

## 2020-10-30 DIAGNOSIS — Z23 Encounter for immunization: Secondary | ICD-10-CM | POA: Diagnosis not present

## 2020-10-30 DIAGNOSIS — Z992 Dependence on renal dialysis: Secondary | ICD-10-CM | POA: Diagnosis not present

## 2020-10-30 DIAGNOSIS — N186 End stage renal disease: Secondary | ICD-10-CM | POA: Diagnosis not present

## 2020-10-30 DIAGNOSIS — E1122 Type 2 diabetes mellitus with diabetic chronic kidney disease: Secondary | ICD-10-CM | POA: Diagnosis not present

## 2020-10-30 DIAGNOSIS — N2581 Secondary hyperparathyroidism of renal origin: Secondary | ICD-10-CM | POA: Diagnosis not present

## 2020-10-30 DIAGNOSIS — D689 Coagulation defect, unspecified: Secondary | ICD-10-CM | POA: Diagnosis not present

## 2020-11-01 DIAGNOSIS — Z0001 Encounter for general adult medical examination with abnormal findings: Secondary | ICD-10-CM | POA: Diagnosis not present

## 2020-11-01 DIAGNOSIS — I10 Essential (primary) hypertension: Secondary | ICD-10-CM | POA: Diagnosis not present

## 2020-11-01 DIAGNOSIS — Z1331 Encounter for screening for depression: Secondary | ICD-10-CM | POA: Diagnosis not present

## 2020-11-01 DIAGNOSIS — E1140 Type 2 diabetes mellitus with diabetic neuropathy, unspecified: Secondary | ICD-10-CM | POA: Diagnosis not present

## 2020-11-01 DIAGNOSIS — N189 Chronic kidney disease, unspecified: Secondary | ICD-10-CM | POA: Diagnosis not present

## 2020-11-01 DIAGNOSIS — E7849 Other hyperlipidemia: Secondary | ICD-10-CM | POA: Diagnosis not present

## 2020-11-01 DIAGNOSIS — Z1389 Encounter for screening for other disorder: Secondary | ICD-10-CM | POA: Diagnosis not present

## 2020-11-01 DIAGNOSIS — Z8679 Personal history of other diseases of the circulatory system: Secondary | ICD-10-CM | POA: Diagnosis not present

## 2020-11-02 DIAGNOSIS — N186 End stage renal disease: Secondary | ICD-10-CM | POA: Diagnosis not present

## 2020-11-02 DIAGNOSIS — N2581 Secondary hyperparathyroidism of renal origin: Secondary | ICD-10-CM | POA: Diagnosis not present

## 2020-11-02 DIAGNOSIS — E1122 Type 2 diabetes mellitus with diabetic chronic kidney disease: Secondary | ICD-10-CM | POA: Diagnosis not present

## 2020-11-02 DIAGNOSIS — Z23 Encounter for immunization: Secondary | ICD-10-CM | POA: Diagnosis not present

## 2020-11-02 DIAGNOSIS — Z992 Dependence on renal dialysis: Secondary | ICD-10-CM | POA: Diagnosis not present

## 2020-11-02 DIAGNOSIS — D689 Coagulation defect, unspecified: Secondary | ICD-10-CM | POA: Diagnosis not present

## 2020-11-03 ENCOUNTER — Ambulatory Visit (HOSPITAL_COMMUNITY)
Admission: RE | Admit: 2020-11-03 | Discharge: 2020-11-03 | Disposition: A | Discharge status: Home / Self Care | Payer: Medicare HMO | Source: Ambulatory Visit | Attending: Vascular Surgery | Admitting: Vascular Surgery

## 2020-11-03 ENCOUNTER — Other Ambulatory Visit: Payer: Self-pay

## 2020-11-03 ENCOUNTER — Ambulatory Visit (INDEPENDENT_AMBULATORY_CARE_PROVIDER_SITE_OTHER)
Admission: RE | Admit: 2020-11-03 | Discharge: 2020-11-03 | Disposition: A | Discharge status: Home / Self Care | Payer: Medicare HMO | Source: Ambulatory Visit | Attending: Vascular Surgery | Admitting: Vascular Surgery

## 2020-11-03 DIAGNOSIS — N186 End stage renal disease: Secondary | ICD-10-CM | POA: Diagnosis not present

## 2020-11-03 DIAGNOSIS — Z992 Dependence on renal dialysis: Secondary | ICD-10-CM | POA: Diagnosis not present

## 2020-11-04 DIAGNOSIS — Z992 Dependence on renal dialysis: Secondary | ICD-10-CM | POA: Diagnosis not present

## 2020-11-04 DIAGNOSIS — N186 End stage renal disease: Secondary | ICD-10-CM | POA: Diagnosis not present

## 2020-11-04 DIAGNOSIS — Z23 Encounter for immunization: Secondary | ICD-10-CM | POA: Diagnosis not present

## 2020-11-04 DIAGNOSIS — D689 Coagulation defect, unspecified: Secondary | ICD-10-CM | POA: Diagnosis not present

## 2020-11-04 DIAGNOSIS — N2581 Secondary hyperparathyroidism of renal origin: Secondary | ICD-10-CM | POA: Diagnosis not present

## 2020-11-04 DIAGNOSIS — E1122 Type 2 diabetes mellitus with diabetic chronic kidney disease: Secondary | ICD-10-CM | POA: Diagnosis not present

## 2020-11-05 ENCOUNTER — Other Ambulatory Visit: Payer: Self-pay

## 2020-11-05 ENCOUNTER — Ambulatory Visit (INDEPENDENT_AMBULATORY_CARE_PROVIDER_SITE_OTHER): Payer: Medicare HMO | Admitting: Vascular Surgery

## 2020-11-05 ENCOUNTER — Encounter: Payer: Self-pay | Admitting: Vascular Surgery

## 2020-11-05 VITALS — BP 176/97 | HR 84 | Temp 97.2°F | Resp 20 | Ht 66.0 in | Wt 198.0 lb

## 2020-11-05 DIAGNOSIS — N186 End stage renal disease: Secondary | ICD-10-CM

## 2020-11-05 DIAGNOSIS — Z992 Dependence on renal dialysis: Secondary | ICD-10-CM

## 2020-11-05 NOTE — Progress Notes (Signed)
Patient ID: Donna Howe, female   DOB: 05-Jun-1956, 65 y.o.   MRN: [id number]  Reason for Consult: Follow-up   Referred by Practice, [facility]  Subjective:     HPI:  Donna Howe is a 65 y.o. female underwent a left arm brachial artery cephalic vein AV fistula creation.  She subsequent underwent stenting of her left subclavian artery and then had ligation of her left arm cephalic AV fistula.  At the time she was having very cold hand now she just has a feeling of swelling and early fatigue of the hand she does not have any ulceration in no longer feels cold but does have some occasional numbness.  She is currently dialyzing via catheter.  She has undergone evaluation of her right upper extremity and veins for consideration of fistula on the right.  Past Medical History:  Diagnosis Date  . Anemia   . Anxiety   . Asthma   . CAD (coronary artery disease)    Multivessel s/p CABG 2005, numerous PCIs since that time  . Carotid artery disease (HCC)    R CEA  . CHF (congestive heart failure) ([place])   . Chronic kidney disease (CKD), stage III (moderate) ([place])    in [place]  . Dyspnea    some  . Essential hypertension    no medications since starting dialysis  . Gout   . Heart murmur   . History of blood transfusion   . Hyperlipidemia   . Hypothyroidism   . Myocardial infarction ([place])   . PAD (peripheral artery disease) ([place])    Dr. Kellie Simmering  . Pneumonia 09/2019, 11/2019  . S/P angioplasty with stent- DES to [facility] and to [facility] to LAD with DES 04/09/18.   04/10/2018  . SBO (small bowel obstruction) ([place]) 2011   Status post lysis of adhesions & hernia repair  . Sinus bradycardia   . Thrombocytopenia, unspecified ([place])   . Type 2 diabetes mellitus ([place])   . Umbilical hernia    Family History  Problem Relation Age of Onset  . Diabetes Mother   . Heart disease Mother        before age 19  . Hyperlipidemia Mother   . Hypertension Mother   . Thyroid disease Father   .  Hypertension Father   . AAA (abdominal aortic aneurysm) Father   . Heart disease Brother        before age 46  . Hypertension Brother   . Hyperlipidemia Son   . Hypertension Son    Past Surgical History:  Procedure Laterality Date  . AORTIC ARCH ANGIOGRAPHY N/A 08/02/2020   Procedure: AORTIC ARCH ANGIOGRAPHY;  Surgeon: Waynetta Sandy, MD;  Location: [facility];  Service: Cardiovascular;  Laterality: N/A;  Lt upper extermity  . AV FISTULA PLACEMENT Left 06/29/2020   Procedure: LEFT ARM ARTERIOVENOUS (AV) FISTULA;  Surgeon: Waynetta Sandy, MD;  Location: [place];  Service: Vascular;  Laterality: Left;  ARM  . CESAREAN SECTION  1984  . CHOLECYSTECTOMY  2010  . CORONARY ARTERY BYPASS GRAFT  2005  . CORONARY BALLOON ANGIOPLASTY N/A 05/31/2017   Procedure: CORONARY BALLOON ANGIOPLASTY;  Surgeon: Jettie Booze, MD;  Location: [place] [facility];  Service: Cardiovascular;  Laterality: N/A;  . CORONARY STENT INTERVENTION N/A 05/31/2017   Procedure: CORONARY STENT INTERVENTION;  Surgeon: Jettie Booze, MD;  Location: [place] [facility];  Service: Cardiovascular;  Laterality: N/A;  . CORONARY STENT INTERVENTION N/A 04/09/2018  Procedure: CORONARY STENT INTERVENTION;  Surgeon: Jettie Booze, MD;  Location: [facility];  Service: Cardiovascular;  Laterality: N/A;  SVG RCA  . ENDARTERECTOMY Right 04/18/2013   Procedure: ENDARTERECTOMY CAROTID;  Surgeon: Mal Misty, MD;  Location: [place];  Service: Vascular;  Laterality: Right;  . HERNIA REPAIR  1989  . Incisional hernia repair x2  03/04/2010   Laparoscopic with 35cm mesh by Dr Ronnald Collum  . IR FLUORO GUIDE CV LINE RIGHT  06/21/2020  . IR US GUIDE VASC ACCESS RIGHT  06/21/2020  . LEFT HEART CATH AND CORS/GRAFTS ANGIOGRAPHY N/A 05/31/2017   Procedure: LEFT HEART CATH AND CORS/GRAFTS ANGIOGRAPHY;  Surgeon: Jettie Booze, MD;  Location: [place] [facility];  Service: Cardiovascular;  Laterality:  N/A;  . LEFT HEART CATH AND CORS/GRAFTS ANGIOGRAPHY N/A 04/08/2018   Procedure: LEFT HEART CATH AND CORS/GRAFTS ANGIOGRAPHY;  Surgeon: Jettie Booze, MD;  Location: [place];  Service: Cardiovascular;  Laterality: N/A;  . LEFT HEART CATH AND CORS/GRAFTS ANGIOGRAPHY N/A 06/22/2020   Procedure: LEFT HEART CATH AND CORS/GRAFTS ANGIOGRAPHY;  Surgeon: Belva Crome, MD;  Location: [facility];  Service: Cardiovascular;  Laterality: N/A;  . LEFT HEART CATHETERIZATION WITH CORONARY ANGIOGRAM N/A 12/19/2012   Procedure: LEFT HEART CATHETERIZATION WITH CORONARY ANGIOGRAM;  Surgeon: Josue Hector, MD;  [facility] CATH LAB;  Service: Cardiovascular;  Laterality: N/A;  . LEFT HEART CATHETERIZATION WITH CORONARY/GRAFT ANGIOGRAM N/A 04/19/2013   Procedure: LEFT HEART CATHETERIZATION WITH Beatrix Fetters;  Surgeon: Lorretta Harp, MD;  Location: [facility] CATH LAB;  Service: Cardiovascular;  Laterality: N/A;  . LIGATION OF ARTERIOVENOUS  FISTULA Left 09/15/2020   Procedure: LIGATION OF LEFT ARM ARTERIOVENOUS  FISTULA;  Surgeon: Waynetta Sandy, MD;  Location: [place];  Service: Vascular;  Laterality: Left;  . PATCH ANGIOPLASTY Right 04/18/2013   Procedure: PATCH ANGIOPLASTY Right Internal Carotid Artery;  Surgeon: Mal Misty, MD;  Location: [place];  Service: Vascular;  Laterality: Right;  . PERCUTANEOUS CORONARY STENT INTERVENTION (PCI-S) Right 12/19/2012   Procedure: PERCUTANEOUS CORONARY STENT INTERVENTION (PCI-S);  Surgeon: Josue Hector, MD;  Location: [facility] CATH LAB;  Service: Cardiovascular;  Laterality: Right;  . PERIPHERAL VASCULAR INTERVENTION Left 08/02/2020   Procedure: PERIPHERAL VASCULAR INTERVENTION;  Surgeon: Waynetta Sandy, MD;  Location: [place] [facility];  Service: Cardiovascular;  Laterality: Left;  Left subclavian  . SHOULDER SURGERY      Short Social History:  Social History   Tobacco Use  . Smoking status: Former Smoker    Packs/day: 1.00     Years: 20.00    Pack years: 20.00    Types: Cigarettes    Quit date: 12/10/2012    Years since quitting: 7.9  . Smokeless tobacco: Never Used  Substance Use Topics  . Alcohol use: No    Alcohol/week: 0.0 standard drinks    Allergies  Allergen Reactions  . Penicillins Other (See Comments)    REACTION: Unknown, told as a child Has patient had a PCN reaction causing immediate rash, facial/tongue/throat swelling, SOB or lightheadedness with hypotension: Unknown Has patient had a PCN reaction causing severe rash involving mucus membranes or skin necrosis: Unknown Has patient had a PCN reaction that required hospitalization: Unknown Has patient had a PCN reaction occurring within the last 10 years: No If all of the above answers are "NO", then may proceed with Cephalosporin use.     Current Outpatient Medications  Medication Sig Dispense Refill  .  Albuterol Sulfate 108 (90 Base) MCG/ACT AEPB Inhale 2 puffs into the lungs every 4 (four) hours as needed (Shortness of breath).     Marland Kitchen allopurinol (ZYLOPRIM) 100 MG tablet Take one tablet after hemodialysis, on Tuesday, Thursday and Saturday. (Patient taking differently: Take 100 mg by mouth Every Tuesday,Thursday,and Saturday with dialysis. After dialysis) 30 tablet 11  . ALPRAZolam (XANAX) 0.5 MG tablet Take 0.25-0.5 mg by mouth See admin instructions. Take 0.25 in the morning and evening and 0.5 mg at bedtime    . aspirin EC 81 MG tablet Take 81 mg by mouth daily.     Marland Kitchen atorvastatin (LIPITOR) 80 MG tablet Take 1 tablet (80 mg total) by mouth every evening. (Patient taking differently: Take 80 mg by mouth at bedtime.)    . AURYXIA 1 GM 210 MG(Fe) tablet Take 420 mg by mouth 3 (three) times daily.    . clopidogrel (PLAVIX) 75 MG tablet Take 1 tablet (75 mg total) by mouth daily. 90 tablet 3  . diclofenac Sodium (VOLTAREN) 1 % GEL Apply 2 g topically 4 (four) times daily as needed (apply to right foot big toe as needed for pain.). 50 g 0  .  gabapentin (NEURONTIN) 400 MG capsule Take 1 capsule (400 mg total) by mouth 2 (two) times daily.    Marland Kitchen ipratropium (ATROVENT HFA) 17 MCG/ACT inhaler Inhale 2 puffs into the lungs every 4 (four) hours as needed for wheezing.     Marland Kitchen levothyroxine (SYNTHROID) 112 MCG tablet Take 112 mcg by mouth daily before breakfast.    . nitroGLYCERIN (NITROSTAT) 0.4 MG SL tablet Place 0.4 mg under the tongue every 5 (five) minutes x 3 doses as needed for chest pain. Not to exceed 3 in 15 minute time frame    . NOVOLIN 70/30 FLEXPEN (70-30) 100 UNIT/ML KwikPen Inject 24 Units into the skin in the morning and at bedtime.    . ondansetron (ZOFRAN) 8 MG tablet Take 8 mg by mouth every 8 (eight) hours as needed for nausea.    . pantoprazole (PROTONIX) 40 MG tablet Take 1 tablet (40 mg total) by mouth 2 (two) times daily. 60 tablet 1  . ranolazine (RANEXA) 500 MG 12 hr tablet Take 1 tablet (500 mg total) by mouth 2 (two) times daily. 60 tablet 1  . Vitamin D, Ergocalciferol, (DRISDOL) 1.25 MG (50000 UNIT) CAPS capsule Take 50,000 Units by mouth every Sunday.     Marland Kitchen oxyCODONE-acetaminophen (PERCOCET/ROXICET) 5-325 MG tablet Take 1 tablet by mouth every 6 (six) hours as needed for severe pain. (Patient not taking: No sig reported) 10 tablet 0   No current facility-administered medications for this visit.    Review of Systems  Musculoskeletal:       Hand pain on the left Neurological: Positive for numbness.        Objective:  Objective   Vitals:   11/05/20 0942  BP: (!) 176/97  Pulse: 84  Resp: 20  Temp: (!) 97.2 F (36.2 C)  SpO2: 97%    Physical Exam Eyes:     Pupils: Pupils are equal, round, and reactive to light.  Cardiovascular:     Pulses:          Radial pulses are 2+ on the right side and 2+ on the left side.  Skin:    General: Skin is warm and dry.     Capillary Refill: Capillary refill takes less than 2 seconds.  Neurological:     General: No focal deficit present.  Mental Status: She  is alert.  Psychiatric:        Mood and Affect: Mood normal.        Behavior: Behavior normal.        Thought Content: Thought content normal.        Judgment: Judgment normal.     Data: Right Pre-Dialysis Findings:  +-----------------------+----------+--------------------+---------+--------  +  Location        PSV (cm/s)Intralum. Diam. (cm)Waveform  Comments  +-----------------------+----------+--------------------+---------+--------  +  Brachial Antecub. [id number]    0.33        triphasic        +-----------------------+----------+--------------------+---------+--------  +  Radial Art at Wrist  45    0.14        triphasic        +-----------------------+----------+--------------------+---------+--------  +  Ulnar Art at Wrist   84    0.19        triphasic        +-----------------------+----------+--------------------+---------+--------    Left Pre-Dialysis Findings:  +-----------------------+----------+--------------------+---------+--------  +  Location        PSV (cm/s)Intralum. Diam. (cm)Waveform  Comments  +-----------------------+----------+--------------------+---------+--------  +  Brachial Antecub. [id number]    0.35        triphasic        +-----------------------+----------+--------------------+---------+--------  +  Radial Art at Wrist  39    0.15        biphasic         +-----------------------+----------+--------------------+---------+--------  +  Ulnar Art at Wrist   35    0.15        biphasic         +-----------------------+----------+--------------------+---------+--------  +      Assessment/Plan:      65 year old female with the above-noted procedures now on dialysis via catheter.  Given her ongoing left hand symptoms patient would like to wait a few months prior to considering right  upper extremity access.  She has already had vein and arterial mapping does not need this repeated.  We will see her back to discuss consideration of fistula versus more likely graft on the right upper extremity.  If she changes her mind she can be scheduled without additional visit.  I have offered her occupational therapy but she believes this will be cost prohibitive.     Waynetta Sandy MD [facility]

## 2020-11-06 DIAGNOSIS — N186 End stage renal disease: Secondary | ICD-10-CM | POA: Diagnosis not present

## 2020-11-06 DIAGNOSIS — D689 Coagulation defect, unspecified: Secondary | ICD-10-CM | POA: Diagnosis not present

## 2020-11-06 DIAGNOSIS — N2581 Secondary hyperparathyroidism of renal origin: Secondary | ICD-10-CM | POA: Diagnosis not present

## 2020-11-06 DIAGNOSIS — Z992 Dependence on renal dialysis: Secondary | ICD-10-CM | POA: Diagnosis not present

## 2020-11-06 DIAGNOSIS — Z23 Encounter for immunization: Secondary | ICD-10-CM | POA: Diagnosis not present

## 2020-11-06 DIAGNOSIS — E1122 Type 2 diabetes mellitus with diabetic chronic kidney disease: Secondary | ICD-10-CM | POA: Diagnosis not present

## 2020-11-09 DIAGNOSIS — N186 End stage renal disease: Secondary | ICD-10-CM | POA: Diagnosis not present

## 2020-11-09 DIAGNOSIS — E1122 Type 2 diabetes mellitus with diabetic chronic kidney disease: Secondary | ICD-10-CM | POA: Diagnosis not present

## 2020-11-09 DIAGNOSIS — D689 Coagulation defect, unspecified: Secondary | ICD-10-CM | POA: Diagnosis not present

## 2020-11-09 DIAGNOSIS — Z23 Encounter for immunization: Secondary | ICD-10-CM | POA: Diagnosis not present

## 2020-11-09 DIAGNOSIS — Z992 Dependence on renal dialysis: Secondary | ICD-10-CM | POA: Diagnosis not present

## 2020-11-09 DIAGNOSIS — N2581 Secondary hyperparathyroidism of renal origin: Secondary | ICD-10-CM | POA: Diagnosis not present

## 2020-11-11 DIAGNOSIS — Z992 Dependence on renal dialysis: Secondary | ICD-10-CM | POA: Diagnosis not present

## 2020-11-11 DIAGNOSIS — D689 Coagulation defect, unspecified: Secondary | ICD-10-CM | POA: Diagnosis not present

## 2020-11-11 DIAGNOSIS — Z23 Encounter for immunization: Secondary | ICD-10-CM | POA: Diagnosis not present

## 2020-11-11 DIAGNOSIS — N186 End stage renal disease: Secondary | ICD-10-CM | POA: Diagnosis not present

## 2020-11-11 DIAGNOSIS — N2581 Secondary hyperparathyroidism of renal origin: Secondary | ICD-10-CM | POA: Diagnosis not present

## 2020-11-11 DIAGNOSIS — E1122 Type 2 diabetes mellitus with diabetic chronic kidney disease: Secondary | ICD-10-CM | POA: Diagnosis not present

## 2020-11-13 DIAGNOSIS — N186 End stage renal disease: Secondary | ICD-10-CM | POA: Diagnosis not present

## 2020-11-13 DIAGNOSIS — D689 Coagulation defect, unspecified: Secondary | ICD-10-CM | POA: Diagnosis not present

## 2020-11-13 DIAGNOSIS — E1122 Type 2 diabetes mellitus with diabetic chronic kidney disease: Secondary | ICD-10-CM | POA: Diagnosis not present

## 2020-11-13 DIAGNOSIS — Z992 Dependence on renal dialysis: Secondary | ICD-10-CM | POA: Diagnosis not present

## 2020-11-13 DIAGNOSIS — N2581 Secondary hyperparathyroidism of renal origin: Secondary | ICD-10-CM | POA: Diagnosis not present

## 2020-11-13 DIAGNOSIS — Z23 Encounter for immunization: Secondary | ICD-10-CM | POA: Diagnosis not present

## 2020-11-15 DIAGNOSIS — E1122 Type 2 diabetes mellitus with diabetic chronic kidney disease: Secondary | ICD-10-CM | POA: Diagnosis not present

## 2020-11-15 DIAGNOSIS — Z992 Dependence on renal dialysis: Secondary | ICD-10-CM | POA: Diagnosis not present

## 2020-11-15 DIAGNOSIS — Z794 Long term (current) use of insulin: Secondary | ICD-10-CM | POA: Diagnosis not present

## 2020-11-15 DIAGNOSIS — Z87891 Personal history of nicotine dependence: Secondary | ICD-10-CM | POA: Diagnosis not present

## 2020-11-15 DIAGNOSIS — E1165 Type 2 diabetes mellitus with hyperglycemia: Secondary | ICD-10-CM | POA: Diagnosis not present

## 2020-11-15 DIAGNOSIS — I70201 Unspecified atherosclerosis of native arteries of extremities, right leg: Secondary | ICD-10-CM | POA: Diagnosis not present

## 2020-11-15 DIAGNOSIS — I10 Essential (primary) hypertension: Secondary | ICD-10-CM | POA: Diagnosis not present

## 2020-11-15 DIAGNOSIS — N186 End stage renal disease: Secondary | ICD-10-CM | POA: Diagnosis not present

## 2020-11-16 DIAGNOSIS — N186 End stage renal disease: Secondary | ICD-10-CM | POA: Diagnosis not present

## 2020-11-16 DIAGNOSIS — D631 Anemia in chronic kidney disease: Secondary | ICD-10-CM | POA: Diagnosis not present

## 2020-11-16 DIAGNOSIS — N2581 Secondary hyperparathyroidism of renal origin: Secondary | ICD-10-CM | POA: Diagnosis not present

## 2020-11-16 DIAGNOSIS — D689 Coagulation defect, unspecified: Secondary | ICD-10-CM | POA: Diagnosis not present

## 2020-11-16 DIAGNOSIS — Z992 Dependence on renal dialysis: Secondary | ICD-10-CM | POA: Diagnosis not present

## 2020-11-18 DIAGNOSIS — D631 Anemia in chronic kidney disease: Secondary | ICD-10-CM | POA: Diagnosis not present

## 2020-11-18 DIAGNOSIS — N186 End stage renal disease: Secondary | ICD-10-CM | POA: Diagnosis not present

## 2020-11-18 DIAGNOSIS — D689 Coagulation defect, unspecified: Secondary | ICD-10-CM | POA: Diagnosis not present

## 2020-11-18 DIAGNOSIS — Z992 Dependence on renal dialysis: Secondary | ICD-10-CM | POA: Diagnosis not present

## 2020-11-18 DIAGNOSIS — N2581 Secondary hyperparathyroidism of renal origin: Secondary | ICD-10-CM | POA: Diagnosis not present

## 2020-11-20 DIAGNOSIS — Z992 Dependence on renal dialysis: Secondary | ICD-10-CM | POA: Diagnosis not present

## 2020-11-20 DIAGNOSIS — D689 Coagulation defect, unspecified: Secondary | ICD-10-CM | POA: Diagnosis not present

## 2020-11-20 DIAGNOSIS — N186 End stage renal disease: Secondary | ICD-10-CM | POA: Diagnosis not present

## 2020-11-20 DIAGNOSIS — D631 Anemia in chronic kidney disease: Secondary | ICD-10-CM | POA: Diagnosis not present

## 2020-11-20 DIAGNOSIS — N2581 Secondary hyperparathyroidism of renal origin: Secondary | ICD-10-CM | POA: Diagnosis not present

## 2020-11-23 DIAGNOSIS — Z992 Dependence on renal dialysis: Secondary | ICD-10-CM | POA: Diagnosis not present

## 2020-11-23 DIAGNOSIS — N186 End stage renal disease: Secondary | ICD-10-CM | POA: Diagnosis not present

## 2020-11-23 DIAGNOSIS — D689 Coagulation defect, unspecified: Secondary | ICD-10-CM | POA: Diagnosis not present

## 2020-11-23 DIAGNOSIS — N2581 Secondary hyperparathyroidism of renal origin: Secondary | ICD-10-CM | POA: Diagnosis not present

## 2020-11-25 DIAGNOSIS — N186 End stage renal disease: Secondary | ICD-10-CM | POA: Diagnosis not present

## 2020-11-25 DIAGNOSIS — Z992 Dependence on renal dialysis: Secondary | ICD-10-CM | POA: Diagnosis not present

## 2020-11-25 DIAGNOSIS — N2581 Secondary hyperparathyroidism of renal origin: Secondary | ICD-10-CM | POA: Diagnosis not present

## 2020-11-25 DIAGNOSIS — D689 Coagulation defect, unspecified: Secondary | ICD-10-CM | POA: Diagnosis not present

## 2020-11-27 DIAGNOSIS — Z992 Dependence on renal dialysis: Secondary | ICD-10-CM | POA: Diagnosis not present

## 2020-11-27 DIAGNOSIS — N186 End stage renal disease: Secondary | ICD-10-CM | POA: Diagnosis not present

## 2020-11-27 DIAGNOSIS — N2581 Secondary hyperparathyroidism of renal origin: Secondary | ICD-10-CM | POA: Diagnosis not present

## 2020-11-27 DIAGNOSIS — D689 Coagulation defect, unspecified: Secondary | ICD-10-CM | POA: Diagnosis not present

## 2020-11-30 DIAGNOSIS — N2581 Secondary hyperparathyroidism of renal origin: Secondary | ICD-10-CM | POA: Diagnosis not present

## 2020-11-30 DIAGNOSIS — D631 Anemia in chronic kidney disease: Secondary | ICD-10-CM | POA: Diagnosis not present

## 2020-11-30 DIAGNOSIS — D689 Coagulation defect, unspecified: Secondary | ICD-10-CM | POA: Diagnosis not present

## 2020-11-30 DIAGNOSIS — N186 End stage renal disease: Secondary | ICD-10-CM | POA: Diagnosis not present

## 2020-11-30 DIAGNOSIS — E1122 Type 2 diabetes mellitus with diabetic chronic kidney disease: Secondary | ICD-10-CM | POA: Diagnosis not present

## 2020-11-30 DIAGNOSIS — Z992 Dependence on renal dialysis: Secondary | ICD-10-CM | POA: Diagnosis not present

## 2020-12-02 DIAGNOSIS — D631 Anemia in chronic kidney disease: Secondary | ICD-10-CM | POA: Diagnosis not present

## 2020-12-02 DIAGNOSIS — Z992 Dependence on renal dialysis: Secondary | ICD-10-CM | POA: Diagnosis not present

## 2020-12-02 DIAGNOSIS — D689 Coagulation defect, unspecified: Secondary | ICD-10-CM | POA: Diagnosis not present

## 2020-12-02 DIAGNOSIS — N2581 Secondary hyperparathyroidism of renal origin: Secondary | ICD-10-CM | POA: Diagnosis not present

## 2020-12-02 DIAGNOSIS — E1122 Type 2 diabetes mellitus with diabetic chronic kidney disease: Secondary | ICD-10-CM | POA: Diagnosis not present

## 2020-12-02 DIAGNOSIS — N186 End stage renal disease: Secondary | ICD-10-CM | POA: Diagnosis not present

## 2020-12-04 DIAGNOSIS — N186 End stage renal disease: Secondary | ICD-10-CM | POA: Diagnosis not present

## 2020-12-04 DIAGNOSIS — N2581 Secondary hyperparathyroidism of renal origin: Secondary | ICD-10-CM | POA: Diagnosis not present

## 2020-12-04 DIAGNOSIS — E1122 Type 2 diabetes mellitus with diabetic chronic kidney disease: Secondary | ICD-10-CM | POA: Diagnosis not present

## 2020-12-04 DIAGNOSIS — D689 Coagulation defect, unspecified: Secondary | ICD-10-CM | POA: Diagnosis not present

## 2020-12-04 DIAGNOSIS — D631 Anemia in chronic kidney disease: Secondary | ICD-10-CM | POA: Diagnosis not present

## 2020-12-04 DIAGNOSIS — Z992 Dependence on renal dialysis: Secondary | ICD-10-CM | POA: Diagnosis not present

## 2020-12-07 DIAGNOSIS — N186 End stage renal disease: Secondary | ICD-10-CM | POA: Diagnosis not present

## 2020-12-07 DIAGNOSIS — D689 Coagulation defect, unspecified: Secondary | ICD-10-CM | POA: Diagnosis not present

## 2020-12-07 DIAGNOSIS — D509 Iron deficiency anemia, unspecified: Secondary | ICD-10-CM | POA: Diagnosis not present

## 2020-12-07 DIAGNOSIS — N2581 Secondary hyperparathyroidism of renal origin: Secondary | ICD-10-CM | POA: Diagnosis not present

## 2020-12-07 DIAGNOSIS — Z992 Dependence on renal dialysis: Secondary | ICD-10-CM | POA: Diagnosis not present

## 2020-12-09 DIAGNOSIS — Z992 Dependence on renal dialysis: Secondary | ICD-10-CM | POA: Diagnosis not present

## 2020-12-09 DIAGNOSIS — D509 Iron deficiency anemia, unspecified: Secondary | ICD-10-CM | POA: Diagnosis not present

## 2020-12-09 DIAGNOSIS — N2581 Secondary hyperparathyroidism of renal origin: Secondary | ICD-10-CM | POA: Diagnosis not present

## 2020-12-09 DIAGNOSIS — N186 End stage renal disease: Secondary | ICD-10-CM | POA: Diagnosis not present

## 2020-12-09 DIAGNOSIS — D689 Coagulation defect, unspecified: Secondary | ICD-10-CM | POA: Diagnosis not present

## 2020-12-11 DIAGNOSIS — N186 End stage renal disease: Secondary | ICD-10-CM | POA: Diagnosis not present

## 2020-12-11 DIAGNOSIS — D509 Iron deficiency anemia, unspecified: Secondary | ICD-10-CM | POA: Diagnosis not present

## 2020-12-11 DIAGNOSIS — Z992 Dependence on renal dialysis: Secondary | ICD-10-CM | POA: Diagnosis not present

## 2020-12-11 DIAGNOSIS — N2581 Secondary hyperparathyroidism of renal origin: Secondary | ICD-10-CM | POA: Diagnosis not present

## 2020-12-11 DIAGNOSIS — D689 Coagulation defect, unspecified: Secondary | ICD-10-CM | POA: Diagnosis not present

## 2020-12-14 DIAGNOSIS — N2581 Secondary hyperparathyroidism of renal origin: Secondary | ICD-10-CM | POA: Diagnosis not present

## 2020-12-14 DIAGNOSIS — Z992 Dependence on renal dialysis: Secondary | ICD-10-CM | POA: Diagnosis not present

## 2020-12-14 DIAGNOSIS — D689 Coagulation defect, unspecified: Secondary | ICD-10-CM | POA: Diagnosis not present

## 2020-12-14 DIAGNOSIS — N186 End stage renal disease: Secondary | ICD-10-CM | POA: Diagnosis not present

## 2020-12-15 DIAGNOSIS — E1165 Type 2 diabetes mellitus with hyperglycemia: Secondary | ICD-10-CM | POA: Diagnosis not present

## 2020-12-15 DIAGNOSIS — Z794 Long term (current) use of insulin: Secondary | ICD-10-CM | POA: Diagnosis not present

## 2020-12-15 DIAGNOSIS — I10 Essential (primary) hypertension: Secondary | ICD-10-CM | POA: Diagnosis not present

## 2020-12-15 DIAGNOSIS — I70201 Unspecified atherosclerosis of native arteries of extremities, right leg: Secondary | ICD-10-CM | POA: Diagnosis not present

## 2020-12-15 DIAGNOSIS — Z87891 Personal history of nicotine dependence: Secondary | ICD-10-CM | POA: Diagnosis not present

## 2020-12-16 DIAGNOSIS — N186 End stage renal disease: Secondary | ICD-10-CM | POA: Diagnosis not present

## 2020-12-16 DIAGNOSIS — N2581 Secondary hyperparathyroidism of renal origin: Secondary | ICD-10-CM | POA: Diagnosis not present

## 2020-12-16 DIAGNOSIS — E1122 Type 2 diabetes mellitus with diabetic chronic kidney disease: Secondary | ICD-10-CM | POA: Diagnosis not present

## 2020-12-16 DIAGNOSIS — Z992 Dependence on renal dialysis: Secondary | ICD-10-CM | POA: Diagnosis not present

## 2020-12-16 DIAGNOSIS — D689 Coagulation defect, unspecified: Secondary | ICD-10-CM | POA: Diagnosis not present

## 2020-12-18 DIAGNOSIS — Z992 Dependence on renal dialysis: Secondary | ICD-10-CM | POA: Diagnosis not present

## 2020-12-18 DIAGNOSIS — D689 Coagulation defect, unspecified: Secondary | ICD-10-CM | POA: Diagnosis not present

## 2020-12-18 DIAGNOSIS — N186 End stage renal disease: Secondary | ICD-10-CM | POA: Diagnosis not present

## 2020-12-18 DIAGNOSIS — N2581 Secondary hyperparathyroidism of renal origin: Secondary | ICD-10-CM | POA: Diagnosis not present

## 2020-12-20 DIAGNOSIS — E11319 Type 2 diabetes mellitus with unspecified diabetic retinopathy without macular edema: Secondary | ICD-10-CM | POA: Diagnosis not present

## 2020-12-21 ENCOUNTER — Ambulatory Visit: Payer: Medicare HMO | Admitting: Cardiology

## 2020-12-21 DIAGNOSIS — D689 Coagulation defect, unspecified: Secondary | ICD-10-CM | POA: Diagnosis not present

## 2020-12-21 DIAGNOSIS — Z992 Dependence on renal dialysis: Secondary | ICD-10-CM | POA: Diagnosis not present

## 2020-12-21 DIAGNOSIS — N186 End stage renal disease: Secondary | ICD-10-CM | POA: Diagnosis not present

## 2020-12-21 DIAGNOSIS — N2581 Secondary hyperparathyroidism of renal origin: Secondary | ICD-10-CM | POA: Diagnosis not present

## 2020-12-23 DIAGNOSIS — N2581 Secondary hyperparathyroidism of renal origin: Secondary | ICD-10-CM | POA: Diagnosis not present

## 2020-12-23 DIAGNOSIS — Z992 Dependence on renal dialysis: Secondary | ICD-10-CM | POA: Diagnosis not present

## 2020-12-23 DIAGNOSIS — D689 Coagulation defect, unspecified: Secondary | ICD-10-CM | POA: Diagnosis not present

## 2020-12-23 DIAGNOSIS — N186 End stage renal disease: Secondary | ICD-10-CM | POA: Diagnosis not present

## 2020-12-25 DIAGNOSIS — N186 End stage renal disease: Secondary | ICD-10-CM | POA: Diagnosis not present

## 2020-12-25 DIAGNOSIS — Z992 Dependence on renal dialysis: Secondary | ICD-10-CM | POA: Diagnosis not present

## 2020-12-25 DIAGNOSIS — D689 Coagulation defect, unspecified: Secondary | ICD-10-CM | POA: Diagnosis not present

## 2020-12-25 DIAGNOSIS — N2581 Secondary hyperparathyroidism of renal origin: Secondary | ICD-10-CM | POA: Diagnosis not present

## 2020-12-28 DIAGNOSIS — D689 Coagulation defect, unspecified: Secondary | ICD-10-CM | POA: Diagnosis not present

## 2020-12-28 DIAGNOSIS — Z992 Dependence on renal dialysis: Secondary | ICD-10-CM | POA: Diagnosis not present

## 2020-12-28 DIAGNOSIS — N2581 Secondary hyperparathyroidism of renal origin: Secondary | ICD-10-CM | POA: Diagnosis not present

## 2020-12-28 DIAGNOSIS — N186 End stage renal disease: Secondary | ICD-10-CM | POA: Diagnosis not present

## 2020-12-30 DIAGNOSIS — Z992 Dependence on renal dialysis: Secondary | ICD-10-CM | POA: Diagnosis not present

## 2020-12-30 DIAGNOSIS — N2581 Secondary hyperparathyroidism of renal origin: Secondary | ICD-10-CM | POA: Diagnosis not present

## 2020-12-30 DIAGNOSIS — D689 Coagulation defect, unspecified: Secondary | ICD-10-CM | POA: Diagnosis not present

## 2020-12-30 DIAGNOSIS — N186 End stage renal disease: Secondary | ICD-10-CM | POA: Diagnosis not present

## 2021-01-01 DIAGNOSIS — D689 Coagulation defect, unspecified: Secondary | ICD-10-CM | POA: Diagnosis not present

## 2021-01-01 DIAGNOSIS — N186 End stage renal disease: Secondary | ICD-10-CM | POA: Diagnosis not present

## 2021-01-01 DIAGNOSIS — Z992 Dependence on renal dialysis: Secondary | ICD-10-CM | POA: Diagnosis not present

## 2021-01-01 DIAGNOSIS — N2581 Secondary hyperparathyroidism of renal origin: Secondary | ICD-10-CM | POA: Diagnosis not present

## 2021-01-05 ENCOUNTER — Ambulatory Visit (INDEPENDENT_AMBULATORY_CARE_PROVIDER_SITE_OTHER): Payer: Medicare HMO | Admitting: Cardiology

## 2021-01-05 ENCOUNTER — Encounter: Payer: Self-pay | Admitting: Cardiology

## 2021-01-05 ENCOUNTER — Other Ambulatory Visit: Payer: Self-pay

## 2021-01-05 VITALS — BP 164/90 | HR 62 | Ht 66.0 in | Wt 210.0 lb

## 2021-01-05 DIAGNOSIS — I739 Peripheral vascular disease, unspecified: Secondary | ICD-10-CM

## 2021-01-05 DIAGNOSIS — N186 End stage renal disease: Secondary | ICD-10-CM | POA: Diagnosis not present

## 2021-01-05 DIAGNOSIS — E782 Mixed hyperlipidemia: Secondary | ICD-10-CM

## 2021-01-05 DIAGNOSIS — I25119 Atherosclerotic heart disease of native coronary artery with unspecified angina pectoris: Secondary | ICD-10-CM | POA: Diagnosis not present

## 2021-01-05 DIAGNOSIS — Z992 Dependence on renal dialysis: Secondary | ICD-10-CM | POA: Diagnosis not present

## 2021-01-05 NOTE — Progress Notes (Signed)
Cardiology Office Note  Date: 01/05/2021   ID: SHARRIE SELF, DOB 1956-01-15, MRN [id number]  PCP:  Curlene Labrum, MD  Cardiologist:  Rozann Lesches, MD Electrophysiologist:  None   Chief Complaint  Patient presents with  . Cardiac follow-up    History of Present Illness: GERRI ACRE is a medically complex 65 y.o. female last assessed via telehealth encounter in October 2021 by Mr. Leonides Sake NP.  She presents for a follow-up visit.  I reviewed her chart.  She does not report any progressive angina symptoms or nitroglycerin use.  Currently on dual antiplatelet therapy along with Ranexa and Lipitor.  She is on hemodialysis, Tuesdays, Thursdays, and Saturdays in [place].  No longer on any antihypertensive therapy.  Reports intermittent hypotension with dialysis, but not on midodrine.  Her last LDL was 67, tolerating high-dose Lipitor well.  I reviewed her cardiac testing from October of last year, plan for medical therapy with limited revascularization options.  Past Medical History:  Diagnosis Date  . Anemia   . Anxiety   . Asthma   . CAD (coronary artery disease)    Multivessel s/p CABG 2005, numerous PCIs since that time and documented graft disease  . Carotid artery disease (HCC)    R CEA  . ESRD on hemodialysis ([place])   . Essential hypertension   . Gout   . History of blood transfusion   . Hyperlipidemia   . Hypothyroidism   . Myocardial infarction ([place])   . PAD (peripheral artery disease) ([place])    Dr. Kellie Simmering  . Pneumonia 09/2019, 11/2019  . S/P angioplasty with stent- DES to [facility] and to LIMA to LAD with DES 04/09/18.   04/10/2018  . SBO (small bowel obstruction) ([place]) 2011   Status post lysis of adhesions & hernia repair  . Sinus bradycardia   . Type 2 diabetes mellitus ([place])   . Umbilical hernia     Past Surgical History:  Procedure Laterality Date  . AORTIC ARCH ANGIOGRAPHY N/A 08/02/2020   Procedure: AORTIC ARCH ANGIOGRAPHY;  Surgeon: Waynetta Sandy, MD;  Location: [place] [facility];  Service: Cardiovascular;  Laterality: N/A;  Lt upper extermity  . AV FISTULA PLACEMENT Left 06/29/2020   Procedure: LEFT ARM ARTERIOVENOUS (AV) FISTULA;  Surgeon: Waynetta Sandy, MD;  Location: [place];  Service: Vascular;  Laterality: Left;  ARM  . CESAREAN SECTION  1984  . CHOLECYSTECTOMY  2010  . CORONARY ARTERY BYPASS GRAFT  2005  . CORONARY BALLOON ANGIOPLASTY N/A 05/31/2017   Procedure: CORONARY BALLOON ANGIOPLASTY;  Surgeon: Jettie Booze, MD;  Location: [place] [facility];  Service: Cardiovascular;  Laterality: N/A;  . CORONARY STENT INTERVENTION N/A 05/31/2017   Procedure: CORONARY STENT INTERVENTION;  Surgeon: Jettie Booze, MD;  Location: [place] [facility];  Service: Cardiovascular;  Laterality: N/A;  . CORONARY STENT INTERVENTION N/A 04/09/2018   Procedure: CORONARY STENT INTERVENTION;  Surgeon: Jettie Booze, MD;  Location: [place] [facility];  Service: Cardiovascular;  Laterality: N/A;  SVG RCA  . ENDARTERECTOMY Right 04/18/2013   Procedure: ENDARTERECTOMY CAROTID;  Surgeon: Mal Misty, MD;  Location: [place];  Service: Vascular;  Laterality: Right;  . HERNIA REPAIR  1989  . Incisional hernia repair x2  03/04/2010   Laparoscopic with 35cm mesh by Dr Ronnald Collum  . IR FLUORO GUIDE CV LINE RIGHT  06/21/2020  . IR US GUIDE VASC ACCESS RIGHT  06/21/2020  . LEFT HEART CATH AND CORS/GRAFTS  ANGIOGRAPHY N/A 05/31/2017   Procedure: LEFT HEART CATH AND CORS/GRAFTS ANGIOGRAPHY;  Surgeon: Jettie Booze, MD;  Location: [place] [facility];  Service: Cardiovascular;  Laterality: N/A;  . LEFT HEART CATH AND CORS/GRAFTS ANGIOGRAPHY N/A 04/08/2018   Procedure: LEFT HEART CATH AND CORS/GRAFTS ANGIOGRAPHY;  Surgeon: Jettie Booze, MD;  Location: [place] [facility];  Service: Cardiovascular;  Laterality: N/A;  . LEFT HEART CATH AND CORS/GRAFTS ANGIOGRAPHY N/A 06/22/2020   Procedure: LEFT HEART CATH AND  CORS/GRAFTS ANGIOGRAPHY;  Surgeon: Belva Crome, MD;  Location: [place] [facility];  Service: Cardiovascular;  Laterality: N/A;  . LEFT HEART CATHETERIZATION WITH CORONARY ANGIOGRAM N/A 12/19/2012   Procedure: LEFT HEART CATHETERIZATION WITH CORONARY ANGIOGRAM;  Surgeon: Josue Hector, MD;  Location: [facility] C[facility] LAB;  Service: Cardiovascular;  Laterality: N/A;  . LEFT HEART CATHETERIZATION WITH CORONARY/GRAFT ANGIOGRAM N/A 04/19/2013   Procedure: LEFT HEART CATHETERIZATION WITH Beatrix Fetters;  Surgeon: Lorretta Harp, MD;  Location: [facility] CATH LAB;  Service: Cardiovascular;  Laterality: N/A;  . LIGATION OF ARTERIOVENOUS  FISTULA Left 09/15/2020   Procedure: LIGATION OF LEFT ARM ARTERIOVENOUS  FISTULA;  Surgeon: Waynetta Sandy, MD;  Location: [place];  Service: Vascular;  Laterality: Left;  . PATCH ANGIOPLASTY Right 04/18/2013   Procedure: PATCH ANGIOPLASTY Right Internal Carotid Artery;  Surgeon: Mal Misty, MD;  Location: [place];  Service: Vascular;  Laterality: Right;  . PERCUTANEOUS CORONARY STENT INTERVENTION (PCI-S) Right 12/19/2012   Procedure: PERCUTANEOUS CORONARY STENT INTERVENTION (PCI-S);  Surgeon: Josue Hector, MD;  Location: [facility] CATH LAB;  Service: Cardiovascular;  Laterality: Right;  . PERIPHERAL VASCULAR INTERVENTION Left 08/02/2020   Procedure: PERIPHERAL VASCULAR INTERVENTION;  Surgeon: Waynetta Sandy, MD;  Location: [place] [facility];  Service: Cardiovascular;  Laterality: Left;  Left subclavian  . SHOULDER SURGERY      Current Outpatient Medications  Medication Sig Dispense Refill  . ALPRAZolam (XANAX) 0.5 MG tablet Take 0.25-0.5 mg by mouth See admin instructions. Take 0.25 in the morning and evening and 0.5 mg at bedtime    . aspirin EC 81 MG tablet Take 81 mg by mouth daily.     Marland Kitchen atorvastatin (LIPITOR) 80 MG tablet Take 1 tablet (80 mg total) by mouth every evening. (Patient taking differently: Take 80 mg by mouth at bedtime.)    . ferric citrate  (AURYXIA) 1 GM 210 MG(Fe) tablet Take 420 mg by mouth 3 (three) times daily with meals.    Marland Kitchen levothyroxine (SYNTHROID) 125 MCG tablet Take 125 mcg by mouth daily before breakfast.    . nitroGLYCERIN (NITROSTAT) 0.4 MG SL tablet Place 0.4 mg under the tongue every 5 (five) minutes x 3 doses as needed for chest pain. Not to exceed 3 in 15 minute time frame    . ranolazine (RANEXA) 500 MG 12 hr tablet Take 1 tablet (500 mg total) by mouth 2 (two) times daily. 60 tablet 1  . Albuterol Sulfate 108 (90 Base) MCG/ACT AEPB Inhale 2 puffs into the lungs every 4 (four) hours as needed (Shortness of breath).     Marland Kitchen allopurinol (ZYLOPRIM) 100 MG tablet Take one tablet after hemodialysis, on Tuesday, Thursday and Saturday. (Patient taking differently: Take 100 mg by mouth Every Tuesday,Thursday,and Saturday with dialysis. After dialysis) 30 tablet 11  . clopidogrel (PLAVIX) 75 MG tablet Take 1 tablet (75 mg total) by mouth daily. 90 tablet 3  . diclofenac Sodium (VOLTAREN) 1 % GEL Apply 2 g topically 4 (four) times  daily as needed (apply to right foot big toe as needed for pain.). 50 g 0  . gabapentin (NEURONTIN) 400 MG capsule Take 1 capsule (400 mg total) by mouth 2 (two) times daily.    Marland Kitchen ipratropium (ATROVENT HFA) 17 MCG/ACT inhaler Inhale 2 puffs into the lungs every 4 (four) hours as needed for wheezing.     Marland Kitchen levothyroxine (SYNTHROID) 112 MCG tablet Take 112 mcg by mouth daily before breakfast.    . NOVOLIN 70/30 FLEXPEN (70-30) 100 UNIT/ML KwikPen Inject 24 Units into the skin in the morning and at bedtime.    . ondansetron (ZOFRAN) 8 MG tablet Take 8 mg by mouth every 8 (eight) hours as needed for nausea.    . pantoprazole (PROTONIX) 40 MG tablet Take 1 tablet (40 mg total) by mouth 2 (two) times daily. 60 tablet 1  . Vitamin D, Ergocalciferol, (DRISDOL) 1.25 MG (50000 UNIT) CAPS capsule Take 50,000 Units by mouth every Sunday.      No current facility-administered medications for this visit.    Allergies:  Penicillins   ROS: No palpitations or syncope.  Physical Exam: VS:  BP (!) 164/90   Pulse 62   Ht 5\' 6"  (1.676 m)   Wt 210 lb (95.3 kg)   SpO2 97%   BMI 33.89 kg/m , BMI Body mass index is 33.89 kg/m.  Wt Readings from Last 3 Encounters:  01/05/21 210 lb (95.3 kg)  11/05/20 198 lb (89.8 kg)  10/20/20 198 lb (89.8 kg)    General: Patient appears comfortable at rest. HEENT: Conjunctiva and lids normal, wearing a mask Thorax: Dialysis catheter left upper chest.. Neck: Supple, no elevated JVP or carotid bruits, no thyromegaly. Lungs: Clear to auscultation, nonlabored breathing at rest. Cardiac: Regular rate and rhythm, no S3, soft systolic murmur, no pericardial rub. Extremities: No pitting edema.  ECG:  An ECG dated 10/07/2020 was personally reviewed today and demonstrated:  Sinus rhythm with diffuse ST-T wave abnormalities.  Recent Labwork: 06/18/2020: B Natriuretic Peptide 2,129.0; Magnesium 2.6; TSH 1.057 10/07/2020: ALT 25; AST 27; BUN 42; Creatinine, Ser 8.79; Hemoglobin 14.0; Platelets 206; Potassium 3.6; Sodium 138     Component Value Date/Time   CHOL 133 06/13/2020 0120   TRIG 257 (H) 10/07/2020 0913   HDL 32 (L) 06/13/2020 0120   CHOLHDL 4.2 06/13/2020 0120   VLDL 34 06/13/2020 0120   LDLCALC 67 06/13/2020 0120    Other Studies Reviewed Today:  Echocardiogram 06/19/2020: 1. Left ventricular ejection fraction, by estimation, is 55 to 60%. The  left ventricle has normal function. The left ventricle has no regional  wall motion abnormalities.  2. The mitral valve is normal in structure. Mild to moderate mitral valve  regurgitation. No evidence of mitral stenosis.  3. Limited echocardiogram to reevalaute LV function and wall motion.   Cardiac catheterization 06/22/2020:  Occlusion of the sequential saphenous vein graft to the obtuse marginal and PDA (left).  Patent saphenous vein graft to the mid RCA.  Patent ostial to proximal saphenous vein  graft stent.  Patent stent in the mid RCA beyond the graft insertion site  Patent LIMA to mid LAD.  Ostial LIMA stent has 50 to 70% ISR.  Cath engagement causes damping of pressure.  Totally occluded proximal RCA  Functionally occluded left main with very faint opacification of the circumflex system despite the absence of a clear channel.  99% stenosis of the ostial to proximal circumflex stent.  Total occlusion of the proximal LAD.  Normal left  ventricular end-diastolic pressure.  RECOMMENDATIONS:   Severe native and bypass graft disease.  No obvious target for PCI this time.  Revascularization strategy would likely include repeat coronary bypass surgery when left internal mammary ostial stent progresses.  Assessment and Plan:  1.  Multivessel CAD status post CABG and multiple percutaneous coronary interventions, most recently documented graft disease that is being managed medically following cardiac catheterization in October 2021.  No accelerating angina at this time.  LVEF 55 to 60%.  Continue aspirin, Plavix, Lopressor, and Ranexa.  2.  ESRD on hemodialysis.  3.  Severe PAD, continues to follow with Dr. Donzetta Matters.  4.  Mixed hyperlipidemia, LDL 67 on high-dose Lipitor.  Medication Adjustments/Labs and Tests Ordered: Current medicines are reviewed at length with the patient today.  Concerns regarding medicines are outlined above.   Tests Ordered: No orders of the defined types were placed in this encounter.   Medication Changes: No orders of the defined types were placed in this encounter.   Disposition:  Follow up 6 months.  Signed, Satira Sark, MD, [facility] 01/05/2021 12:13 PM    [facility] [place], [place], [place] [address] Phone: [phone]; Fax: [phone]

## 2021-01-05 NOTE — Patient Instructions (Addendum)

## 2021-01-06 DIAGNOSIS — E1122 Type 2 diabetes mellitus with diabetic chronic kidney disease: Secondary | ICD-10-CM | POA: Diagnosis not present

## 2021-01-06 DIAGNOSIS — D689 Coagulation defect, unspecified: Secondary | ICD-10-CM | POA: Diagnosis not present

## 2021-01-06 DIAGNOSIS — Z992 Dependence on renal dialysis: Secondary | ICD-10-CM | POA: Diagnosis not present

## 2021-01-06 DIAGNOSIS — E039 Hypothyroidism, unspecified: Secondary | ICD-10-CM | POA: Diagnosis not present

## 2021-01-06 DIAGNOSIS — N186 End stage renal disease: Secondary | ICD-10-CM | POA: Diagnosis not present

## 2021-01-06 DIAGNOSIS — N2581 Secondary hyperparathyroidism of renal origin: Secondary | ICD-10-CM | POA: Diagnosis not present

## 2021-01-08 DIAGNOSIS — E1122 Type 2 diabetes mellitus with diabetic chronic kidney disease: Secondary | ICD-10-CM | POA: Diagnosis not present

## 2021-01-08 DIAGNOSIS — N2581 Secondary hyperparathyroidism of renal origin: Secondary | ICD-10-CM | POA: Diagnosis not present

## 2021-01-08 DIAGNOSIS — E039 Hypothyroidism, unspecified: Secondary | ICD-10-CM | POA: Diagnosis not present

## 2021-01-08 DIAGNOSIS — Z992 Dependence on renal dialysis: Secondary | ICD-10-CM | POA: Diagnosis not present

## 2021-01-08 DIAGNOSIS — N186 End stage renal disease: Secondary | ICD-10-CM | POA: Diagnosis not present

## 2021-01-08 DIAGNOSIS — D689 Coagulation defect, unspecified: Secondary | ICD-10-CM | POA: Diagnosis not present

## 2021-01-09 ENCOUNTER — Emergency Department (HOSPITAL_COMMUNITY): Payer: Medicare HMO

## 2021-01-09 ENCOUNTER — Other Ambulatory Visit: Payer: Self-pay

## 2021-01-09 ENCOUNTER — Inpatient Hospital Stay (HOSPITAL_COMMUNITY)
Admission: EM | Admit: 2021-01-09 | Discharge: 2021-01-11 | DRG: 246 | Disposition: A | Discharge status: Home / Self Care | Payer: Medicare HMO | Attending: Internal Medicine | Admitting: Internal Medicine

## 2021-01-09 ENCOUNTER — Encounter (HOSPITAL_COMMUNITY): Payer: Self-pay

## 2021-01-09 DIAGNOSIS — Z955 Presence of coronary angioplasty implant and graft: Secondary | ICD-10-CM

## 2021-01-09 DIAGNOSIS — I953 Hypotension of hemodialysis: Secondary | ICD-10-CM | POA: Diagnosis not present

## 2021-01-09 DIAGNOSIS — E782 Mixed hyperlipidemia: Secondary | ICD-10-CM | POA: Diagnosis present

## 2021-01-09 DIAGNOSIS — J45909 Unspecified asthma, uncomplicated: Secondary | ICD-10-CM | POA: Diagnosis present

## 2021-01-09 DIAGNOSIS — Z6834 Body mass index (BMI) 34.0-34.9, adult: Secondary | ICD-10-CM | POA: Diagnosis not present

## 2021-01-09 DIAGNOSIS — K219 Gastro-esophageal reflux disease without esophagitis: Secondary | ICD-10-CM

## 2021-01-09 DIAGNOSIS — I249 Acute ischemic heart disease, unspecified: Secondary | ICD-10-CM

## 2021-01-09 DIAGNOSIS — E1165 Type 2 diabetes mellitus with hyperglycemia: Secondary | ICD-10-CM | POA: Diagnosis present

## 2021-01-09 DIAGNOSIS — E1151 Type 2 diabetes mellitus with diabetic peripheral angiopathy without gangrene: Secondary | ICD-10-CM | POA: Diagnosis present

## 2021-01-09 DIAGNOSIS — E669 Obesity, unspecified: Secondary | ICD-10-CM | POA: Diagnosis not present

## 2021-01-09 DIAGNOSIS — Z880 Allergy status to penicillin: Secondary | ICD-10-CM | POA: Diagnosis not present

## 2021-01-09 DIAGNOSIS — Z7982 Long term (current) use of aspirin: Secondary | ICD-10-CM

## 2021-01-09 DIAGNOSIS — M109 Gout, unspecified: Secondary | ICD-10-CM | POA: Diagnosis present

## 2021-01-09 DIAGNOSIS — N2581 Secondary hyperparathyroidism of renal origin: Secondary | ICD-10-CM | POA: Diagnosis present

## 2021-01-09 DIAGNOSIS — D696 Thrombocytopenia, unspecified: Secondary | ICD-10-CM | POA: Diagnosis present

## 2021-01-09 DIAGNOSIS — Z79890 Hormone replacement therapy (postmenopausal): Secondary | ICD-10-CM

## 2021-01-09 DIAGNOSIS — E1140 Type 2 diabetes mellitus with diabetic neuropathy, unspecified: Secondary | ICD-10-CM | POA: Diagnosis present

## 2021-01-09 DIAGNOSIS — N250 Renal osteodystrophy: Secondary | ICD-10-CM | POA: Diagnosis not present

## 2021-01-09 DIAGNOSIS — R079 Chest pain, unspecified: Secondary | ICD-10-CM | POA: Diagnosis present

## 2021-01-09 DIAGNOSIS — R778 Other specified abnormalities of plasma proteins: Secondary | ICD-10-CM

## 2021-01-09 DIAGNOSIS — D72829 Elevated white blood cell count, unspecified: Secondary | ICD-10-CM

## 2021-01-09 DIAGNOSIS — Z20822 Contact with and (suspected) exposure to covid-19: Secondary | ICD-10-CM | POA: Diagnosis present

## 2021-01-09 DIAGNOSIS — Z794 Long term (current) use of insulin: Secondary | ICD-10-CM | POA: Diagnosis not present

## 2021-01-09 DIAGNOSIS — E039 Hypothyroidism, unspecified: Secondary | ICD-10-CM | POA: Diagnosis not present

## 2021-01-09 DIAGNOSIS — I214 Non-ST elevation (NSTEMI) myocardial infarction: Secondary | ICD-10-CM | POA: Diagnosis not present

## 2021-01-09 DIAGNOSIS — E1159 Type 2 diabetes mellitus with other circulatory complications: Secondary | ICD-10-CM

## 2021-01-09 DIAGNOSIS — R718 Other abnormality of red blood cells: Secondary | ICD-10-CM | POA: Diagnosis not present

## 2021-01-09 DIAGNOSIS — D631 Anemia in chronic kidney disease: Secondary | ICD-10-CM | POA: Diagnosis present

## 2021-01-09 DIAGNOSIS — Z992 Dependence on renal dialysis: Secondary | ICD-10-CM

## 2021-01-09 DIAGNOSIS — I120 Hypertensive chronic kidney disease with stage 5 chronic kidney disease or end stage renal disease: Secondary | ICD-10-CM | POA: Diagnosis present

## 2021-01-09 DIAGNOSIS — Z8249 Family history of ischemic heart disease and other diseases of the circulatory system: Secondary | ICD-10-CM

## 2021-01-09 DIAGNOSIS — I2510 Atherosclerotic heart disease of native coronary artery without angina pectoris: Secondary | ICD-10-CM | POA: Diagnosis present

## 2021-01-09 DIAGNOSIS — T82855A Stenosis of coronary artery stent, initial encounter: Secondary | ICD-10-CM | POA: Diagnosis not present

## 2021-01-09 DIAGNOSIS — F419 Anxiety disorder, unspecified: Secondary | ICD-10-CM | POA: Diagnosis present

## 2021-01-09 DIAGNOSIS — Z79899 Other long term (current) drug therapy: Secondary | ICD-10-CM

## 2021-01-09 DIAGNOSIS — I25810 Atherosclerosis of coronary artery bypass graft(s) without angina pectoris: Secondary | ICD-10-CM | POA: Diagnosis not present

## 2021-01-09 DIAGNOSIS — I25710 Atherosclerosis of autologous vein coronary artery bypass graft(s) with unstable angina pectoris: Secondary | ICD-10-CM | POA: Diagnosis not present

## 2021-01-09 DIAGNOSIS — Z87891 Personal history of nicotine dependence: Secondary | ICD-10-CM

## 2021-01-09 DIAGNOSIS — Z951 Presence of aortocoronary bypass graft: Secondary | ICD-10-CM

## 2021-01-09 DIAGNOSIS — I200 Unstable angina: Secondary | ICD-10-CM | POA: Diagnosis not present

## 2021-01-09 DIAGNOSIS — Y831 Surgical operation with implant of artificial internal device as the cause of abnormal reaction of the patient, or of later complication, without mention of misadventure at the time of the procedure: Secondary | ICD-10-CM | POA: Diagnosis not present

## 2021-01-09 DIAGNOSIS — N186 End stage renal disease: Secondary | ICD-10-CM | POA: Diagnosis not present

## 2021-01-09 DIAGNOSIS — I25110 Atherosclerotic heart disease of native coronary artery with unstable angina pectoris: Secondary | ICD-10-CM | POA: Diagnosis not present

## 2021-01-09 DIAGNOSIS — E8889 Other specified metabolic disorders: Secondary | ICD-10-CM | POA: Diagnosis present

## 2021-01-09 DIAGNOSIS — I259 Chronic ischemic heart disease, unspecified: Secondary | ICD-10-CM | POA: Diagnosis not present

## 2021-01-09 DIAGNOSIS — Z83438 Family history of other disorder of lipoprotein metabolism and other lipidemia: Secondary | ICD-10-CM

## 2021-01-09 DIAGNOSIS — Z833 Family history of diabetes mellitus: Secondary | ICD-10-CM

## 2021-01-09 DIAGNOSIS — R0789 Other chest pain: Secondary | ICD-10-CM | POA: Diagnosis not present

## 2021-01-09 DIAGNOSIS — I252 Old myocardial infarction: Secondary | ICD-10-CM

## 2021-01-09 DIAGNOSIS — R7989 Other specified abnormal findings of blood chemistry: Secondary | ICD-10-CM

## 2021-01-09 DIAGNOSIS — E785 Hyperlipidemia, unspecified: Secondary | ICD-10-CM | POA: Diagnosis not present

## 2021-01-09 DIAGNOSIS — Z7902 Long term (current) use of antithrombotics/antiplatelets: Secondary | ICD-10-CM

## 2021-01-09 LAB — BASIC METABOLIC PANEL
Anion gap: 13 (ref 5–15)
BUN: 39 mg/dL — ABNORMAL HIGH (ref 8–23)
CO2: 27 mmol/L (ref 22–32)
Calcium: 9.5 mg/dL (ref 8.9–10.3)
Chloride: 96 mmol/L — ABNORMAL LOW (ref 98–111)
Creatinine, Ser: 5.86 mg/dL — ABNORMAL HIGH (ref 0.44–1.00)
GFR, Estimated: 8 mL/min — ABNORMAL LOW (ref >60)
Glucose, Bld: 205 mg/dL — ABNORMAL HIGH (ref 70–99)
Potassium: 4 mmol/L (ref 3.5–5.1)
Sodium: 136 mmol/L (ref 135–145)

## 2021-01-09 LAB — RESP PANEL BY RT-PCR (FLU A&B, COVID) ARPGX2
Influenza A by PCR: NEGATIVE
Influenza B by PCR: NEGATIVE
SARS Coronavirus 2 by RT PCR: NEGATIVE

## 2021-01-09 LAB — CBC
HCT: 38.9 % (ref 36.0–46.0)
Hemoglobin: 12.5 g/dL (ref 12.0–15.0)
MCH: 33.1 pg (ref 26.0–34.0)
MCHC: 32.1 g/dL (ref 30.0–36.0)
MCV: 102.9 fL — ABNORMAL HIGH (ref 80.0–100.0)
Platelets: 174 10*3/uL (ref 150–400)
RBC: 3.78 MIL/uL — ABNORMAL LOW (ref 3.87–5.11)
RDW: 13.5 % (ref 11.5–15.5)
WBC: 11.2 10*3/uL — ABNORMAL HIGH (ref 4.0–10.5)
nRBC: 0 % (ref 0.0–0.2)

## 2021-01-09 LAB — TROPONIN I (HIGH SENSITIVITY)
Troponin I (High Sensitivity): 80 ng/L — ABNORMAL HIGH (ref <18)
Troponin I (High Sensitivity): 141 ng/L (ref <18)
Troponin I (High Sensitivity): 209 ng/L (ref <18)

## 2021-01-09 LAB — CBG MONITORING, ED: Glucose-Capillary: 179 mg/dL — ABNORMAL HIGH (ref 70–99)

## 2021-01-09 MED ORDER — INSULIN ASPART 100 UNIT/ML ~~LOC~~ SOLN
0.0000 [IU] | SUBCUTANEOUS | Status: DC
  Administered 2021-01-09: 1 [IU] via SUBCUTANEOUS
  Administered 2021-01-10: 2 [IU] via SUBCUTANEOUS
  Filled 2021-01-09: qty 1

## 2021-01-09 MED ORDER — LEVOTHYROXINE SODIUM 112 MCG PO TABS
112.0000 ug | ORAL_TABLET | Freq: Every day | ORAL | Status: DC
  Administered 2021-01-11: 112 ug via ORAL
  Filled 2021-01-09: qty 1

## 2021-01-09 MED ORDER — IPRATROPIUM BROMIDE 0.02 % IN SOLN: 0.5000 mg | RESPIRATORY_TRACT | Status: DC | PRN

## 2021-01-09 MED ORDER — IPRATROPIUM BROMIDE HFA 17 MCG/ACT IN AERS: 2.0000 | INHALATION_SPRAY | RESPIRATORY_TRACT | Status: DC | PRN

## 2021-01-09 MED ORDER — ALBUTEROL SULFATE HFA 108 (90 BASE) MCG/ACT IN AERS
2.0000 | INHALATION_SPRAY | RESPIRATORY_TRACT | Status: DC | PRN
  Filled 2021-01-09: qty 6.7

## 2021-01-09 MED ORDER — CLOPIDOGREL BISULFATE 75 MG PO TABS: 75.0000 mg | ORAL_TABLET | Freq: Every day | ORAL | Status: DC

## 2021-01-09 MED ORDER — HEPARIN (PORCINE) 25000 UT/250ML-% IV SOLN
INTRAVENOUS | Status: AC
  Administered 2021-01-09: 1250 [IU]/h via INTRAVENOUS
  Filled 2021-01-09: qty 250

## 2021-01-09 MED ORDER — PANTOPRAZOLE SODIUM 40 MG PO TBEC
40.0000 mg | DELAYED_RELEASE_TABLET | Freq: Two times a day (BID) | ORAL | Status: DC
  Administered 2021-01-09 – 2021-01-11 (×4): 40 mg via ORAL
  Filled 2021-01-09 (×4): qty 1

## 2021-01-09 MED ORDER — NITROGLYCERIN IN D5W 200-5 MCG/ML-% IV SOLN
5.0000 ug/min | INTRAVENOUS | Status: DC
Start: 2021-01-09 — End: 2021-01-11
  Administered 2021-01-09: 5 ug/min via INTRAVENOUS

## 2021-01-09 MED ORDER — ASPIRIN EC 81 MG PO TBEC
81.0000 mg | DELAYED_RELEASE_TABLET | Freq: Every day | ORAL | Status: DC
  Administered 2021-01-10 – 2021-01-11 (×2): 81 mg via ORAL
  Filled 2021-01-09 (×2): qty 1

## 2021-01-09 MED ORDER — HEPARIN (PORCINE) 25000 UT/250ML-% IV SOLN: 1350.0000 [IU]/h | INTRAVENOUS | Status: DC

## 2021-01-09 MED ORDER — ATORVASTATIN CALCIUM 80 MG PO TABS
80.0000 mg | ORAL_TABLET | Freq: Every day | ORAL | Status: DC
  Administered 2021-01-09 – 2021-01-10 (×2): 80 mg via ORAL
  Filled 2021-01-09: qty 2
  Filled 2021-01-09: qty 1

## 2021-01-09 MED ORDER — NITROGLYCERIN IN D5W 200-5 MCG/ML-% IV SOLN
INTRAVENOUS | Status: AC
  Filled 2021-01-09: qty 250

## 2021-01-09 MED ORDER — LEVOTHYROXINE SODIUM 112 MCG PO TABS: 112.0000 ug | ORAL_TABLET | Freq: Every day | ORAL | Status: DC

## 2021-01-09 MED ORDER — HEPARIN BOLUS VIA INFUSION
4000.0000 [IU] | Freq: Once | INTRAVENOUS | Status: AC
  Administered 2021-01-09: 4000 [IU] via INTRAVENOUS

## 2021-01-09 NOTE — ED Triage Notes (Signed)
[place] left chest pain that began this morning, took nitro with relief ~45 minutes ago, states weakness/increased fatigue and pain that radiates to her back. No HA, no vision changes, or diaphoresis. Left arm numbness. A&Ox4

## 2021-01-09 NOTE — ED Provider Notes (Signed)
[facility] EMERGENCY DEPARTMENT Provider Note   CSN: [id number] Arrival date & time: 01/09/21  1622     History Chief Complaint  Patient presents with  . Chest Pain    Donna Howe is a 65 y.o. female.  The history is provided by the patient. No language interpreter was used.  Chest Pain Pain location:  Substernal area and L chest Pain quality: aching   Pain radiates to:  Neck and L arm Pain severity:  Moderate Onset quality:  Gradual Duration:  1 day Timing:  Constant Progression:  Worsening Chronicity:  New Relieved by:  Nothing Worsened by:  Nothing Ineffective treatments:  None tried Associated symptoms: no abdominal pain   Risk factors: coronary artery disease, diabetes mellitus and hypertension        Past Medical History:  Diagnosis Date  . Anemia   . Anxiety   . Asthma   . CAD (coronary artery disease)    Multivessel s/p CABG 2005, numerous PCIs since that time and documented graft disease  . Carotid artery disease (HCC)    R CEA  . ESRD on hemodialysis ([place])   . Essential hypertension   . Gout   . History of blood transfusion   . Hyperlipidemia   . Hypothyroidism   . Myocardial infarction ([place])   . PAD (peripheral artery disease) ([place])    Dr. Kellie Simmering  . Pneumonia 09/2019, 11/2019  . S/P angioplasty with stent- DES to [facility] and to LIMA to LAD with DES 04/09/18.   04/10/2018  . SBO (small bowel obstruction) ([place]) 2011   Status post lysis of adhesions & hernia repair  . Sinus bradycardia   . Type 2 diabetes mellitus ([place])   . Umbilical hernia     Patient Active Problem List   Diagnosis Date Noted  . Unspecified protein-calorie malnutrition ([place]) 07/05/2020  . Allergy, unspecified, initial encounter 06/30/2020  . Anaphylactic shock, unspecified, initial encounter 06/30/2020  . Anemia in chronic kidney disease 06/30/2020  . Coagulation defect, unspecified ([place]) 06/30/2020  . Diarrhea, unspecified 06/30/2020  . Encounter for immunization  06/30/2020  . Iron deficiency anemia, unspecified 06/30/2020  . Pain, unspecified 06/30/2020  . Peripheral vascular disease ([place]) 06/30/2020  . Pruritus, unspecified 06/30/2020  . Secondary hyperparathyroidism of renal origin ([place]) 06/30/2020  . End stage renal disease ([place])   . SOB (shortness of breath)   . Hyperkalemia 06/18/2020  . Chronic diastolic HF (heart failure) ([place])   . Gastroesophageal reflux disease   . Acute renal failure superimposed on stage 5 chronic kidney disease, not on chronic dialysis ([place]) 06/13/2020  . Uncontrolled type 2 diabetes mellitus with hyperglycemia, with long-term current use of insulin ([place]) 06/13/2020  . Pneumonia 12/17/2019  . Cough variant asthma with component of UACS 11/20/2019  . Elevated troponin 10/02/2019  . Chest pain 10/01/2019  . Unstable angina ([place]) 04/18/2018  . Hypothyroidism 04/18/2018  . S/P angioplasty with stent- DES to [facility] and to LIMA to LAD with DES 04/09/18.   04/10/2018  . Angina pectoris ([place]) 04/05/2018  . Status post coronary artery stent placement   . ACS (acute coronary syndrome) ([place]) 05/31/2017  . Acute chest pain   . NSTEMI (non-ST elevated myocardial infarction) ([place])   . History of coronary artery disease   . Diabetes ([place]) 10/28/2013  . Hypoglycemia associated with diabetes ([place]) 10/28/2013  . Thrombocytopenia, unspecified ([place]) 10/28/2013  . Hypokalemia 10/27/2013  . Syncope 10/26/2013  . Anemia 10/26/2013  . AKI (acute kidney injury) ([place]) 10/26/2013  .  Fracture of toe of right foot 10/26/2013  . Umbilical hernia   . Carotid artery disease ([place])   . Occlusion and stenosis of carotid artery without mention of cerebral infarction 04/15/2013  . Hx of CABG   . Ejection fraction   . Atherosclerosis of native artery of extremity with intermittent claudication ([place]) 02/11/2013  . Diabetes mellitus with renal manifestation ([place]) 01/21/2013  . Bradycardia 01/03/2013  . Chronic kidney disease (CKD), stage III  (moderate) (HCC)   . Gout   . PROTEINURIA, MILD 01/18/2010  . Class 2 obesity 08/26/2009  . Asthma 08/26/2009  . Hyperlipidemia 03/31/2007  . Essential hypertension 03/31/2007  . CAD (coronary artery disease) 03/31/2007    Past Surgical History:  Procedure Laterality Date  . AORTIC ARCH ANGIOGRAPHY N/A 08/02/2020   Procedure: AORTIC ARCH ANGIOGRAPHY;  Surgeon: Waynetta Sandy, MD;  Location: [place] [facility];  Service: Cardiovascular;  Laterality: N/A;  Lt upper extermity  . AV FISTULA PLACEMENT Left 06/29/2020   Procedure: LEFT ARM ARTERIOVENOUS (AV) FISTULA;  Surgeon: Waynetta Sandy, MD;  Location: [place];  Service: Vascular;  Laterality: Left;  ARM  . CESAREAN SECTION  1984  . CHOLECYSTECTOMY  2010  . CORONARY ARTERY BYPASS GRAFT  2005  . CORONARY BALLOON ANGIOPLASTY N/A 05/31/2017   Procedure: CORONARY BALLOON ANGIOPLASTY;  Surgeon: Jettie Booze, MD;  Location: [place] [facility];  Service: Cardiovascular;  Laterality: N/A;  . CORONARY STENT INTERVENTION N/A 05/31/2017   Procedure: CORONARY STENT INTERVENTION;  Surgeon: Jettie Booze, MD;  Location: [place] [facility];  Service: Cardiovascular;  Laterality: N/A;  . CORONARY STENT INTERVENTION N/A 04/09/2018   Procedure: CORONARY STENT INTERVENTION;  Surgeon: Jettie Booze, MD;  Location: [place] [facility];  Service: Cardiovascular;  Laterality: N/A;  SVG RCA  . ENDARTERECTOMY Right 04/18/2013   Procedure: ENDARTERECTOMY CAROTID;  Surgeon: Mal Misty, MD;  Location: [place];  Service: Vascular;  Laterality: Right;  . HERNIA REPAIR  1989  . Incisional hernia repair x2  03/04/2010   Laparoscopic with 35cm mesh by Dr Ronnald Collum  . IR FLUORO GUIDE CV LINE RIGHT  06/21/2020  . IR US GUIDE VASC ACCESS RIGHT  06/21/2020  . LEFT HEART CATH AND CORS/GRAFTS ANGIOGRAPHY N/A 05/31/2017   Procedure: LEFT HEART CATH AND CORS/GRAFTS ANGIOGRAPHY;  Surgeon: Jettie Booze, MD;  Location: [place] [facility];  Service: Cardiovascular;  Laterality: N/A;  . LEFT HEART CATH AND CORS/GRAFTS ANGIOGRAPHY N/A 04/08/2018   Procedure: LEFT HEART CATH AND CORS/GRAFTS ANGIOGRAPHY;  Surgeon: Jettie Booze, MD;  Location: [place] [facility];  Service: Cardiovascular;  Laterality: N/A;  . LEFT HEART CATH AND CORS/GRAFTS ANGIOGRAPHY N/A 06/22/2020   Procedure: LEFT HEART CATH AND CORS/GRAFTS ANGIOGRAPHY;  Surgeon: Belva Crome, MD;  Location: [place] [facility];  Service: Cardiovascular;  Laterality: N/A;  . LEFT HEART CATHETERIZATION WITH CORONARY ANGIOGRAM N/A 12/19/2012   Procedure: LEFT HEART CATHETERIZATION WITH CORONARY ANGIOGRAM;  Surgeon: Josue Hector, MD;  Location: [facility] CATH LAB;  Service: Cardiovascular;  Laterality: N/A;  . LEFT HEART CATHETERIZATION WITH CORONARY/GRAFT ANGIOGRAM N/A 04/19/2013   Procedure: LEFT HEART CATHETERIZATION WITH Beatrix Fetters;  Surgeon: Lorretta Harp, MD;  Location: [facility] CATH LAB;  Service: Cardiovascular;  Laterality: N/A;  . LIGATION OF ARTERIOVENOUS  FISTULA Left 09/15/2020   Procedure: LIGATION OF LEFT ARM ARTERIOVENOUS  FISTULA;  Surgeon: Waynetta Sandy, MD;  Location: [place];  Service: Vascular;  Laterality: Left;  .  PATCH ANGIOPLASTY Right 04/18/2013   Procedure: PATCH ANGIOPLASTY Right Internal Carotid Artery;  Surgeon: Mal Misty, MD;  Location: [place];  Service: Vascular;  Laterality: Right;  . PERCUTANEOUS CORONARY STENT INTERVENTION (PCI-S) Right 12/19/2012   Procedure: PERCUTANEOUS CORONARY STENT INTERVENTION (PCI-S);  Surgeon: Josue Hector, MD;  Location: [facility] CATH LAB;  Service: Cardiovascular;  Laterality: Right;  . PERIPHERAL VASCULAR INTERVENTION Left 08/02/2020   Procedure: PERIPHERAL VASCULAR INTERVENTION;  Surgeon: Waynetta Sandy, MD;  Location: [place] [facility];  Service: Cardiovascular;  Laterality: Left;  Left subclavian  . SHOULDER SURGERY       OB History   No obstetric history on file.     Family History   Problem Relation Age of Onset  . Diabetes Mother   . Heart disease Mother        before age 69  . Hyperlipidemia Mother   . Hypertension Mother   . Thyroid disease Father   . Hypertension Father   . AAA (abdominal aortic aneurysm) Father   . Heart disease Brother        before age 78  . Hypertension Brother   . Hyperlipidemia Son   . Hypertension Son     Social History   Tobacco Use  . Smoking status: Former Smoker    Packs/day: 1.00    Years: 20.00    Pack years: 20.00    Types: Cigarettes    Quit date: 12/10/2012    Years since quitting: 8.0  . Smokeless tobacco: Never Used  Vaping Use  . Vaping Use: Never used  Substance Use Topics  . Alcohol use: No    Alcohol/week: 0.0 standard drinks  . Drug use: No    Home Medications Prior to Admission medications   Medication Sig Start Date End Date Taking? Authorizing Provider  Albuterol Sulfate 108 (90 Base) MCG/ACT AEPB Inhale 2 puffs into the lungs every 4 (four) hours as needed (Shortness of breath).     [provider]  allopurinol (ZYLOPRIM) 100 MG tablet Take one tablet after hemodialysis, on Tuesday, Thursday and Saturday. Patient taking differently: Take 100 mg by mouth Every Tuesday,Thursday,and Saturday with dialysis. After dialysis 06/30/20   Arrien, Jimmy Picket, MD  ALPRAZolam Duanne Moron) 0.5 MG tablet Take 0.25-0.5 mg by mouth See admin instructions. Take 0.25 in the morning and evening and 0.5 mg at bedtime    [provider]  aspirin EC 81 MG tablet Take 81 mg by mouth daily.     [provider]  atorvastatin (LIPITOR) 80 MG tablet Take 1 tablet (80 mg total) by mouth every evening. Patient taking differently: Take 80 mg by mouth at bedtime. 12/19/19   Johnson, Clanford L, MD  clopidogrel (PLAVIX) 75 MG tablet Take 1 tablet (75 mg total) by mouth daily. 08/27/18   Satira Sark, MD  diclofenac Sodium (VOLTAREN) 1 % GEL Apply 2 g topically 4 (four) times daily as needed (apply to  right foot big toe as needed for pain.). 06/30/20   Arrien, Jimmy Picket, MD  ferric citrate (AURYXIA) 1 GM 210 MG(Fe) tablet Take 420 mg by mouth 3 (three) times daily with meals.    [provider]  gabapentin (NEURONTIN) 400 MG capsule Take 1 capsule (400 mg total) by mouth 2 (two) times daily. 06/17/20   Barton Dubois, MD  ipratropium (ATROVENT HFA) 17 MCG/ACT inhaler Inhale 2 puffs into the lungs every 4 (four) hours as needed for wheezing.     [provider]  levothyroxine (SYNTHROID) 112 MCG tablet Take 112 mcg by mouth daily before breakfast.    [provider]  levothyroxine (SYNTHROID) 125 MCG tablet Take 125 mcg by mouth daily before breakfast.    [provider]  nitroGLYCERIN (NITROSTAT) 0.4 MG SL tablet Place 0.4 mg under the tongue every 5 (five) minutes x 3 doses as needed for chest pain. Not to exceed 3 in 15 minute time frame    [provider]  NOVOLIN 70/30 FLEXPEN (70-30) 100 UNIT/ML KwikPen Inject 24 Units into the skin in the morning and at bedtime. 06/30/20   [provider]  ondansetron (ZOFRAN) 8 MG tablet Take 8 mg by mouth every 8 (eight) hours as needed for nausea. 07/12/20   [provider]  pantoprazole (PROTONIX) 40 MG tablet Take 1 tablet (40 mg total) by mouth 2 (two) times daily. 06/17/20   Barton Dubois, MD  ranolazine (RANEXA) 500 MG 12 hr tablet Take 1 tablet (500 mg total) by mouth 2 (two) times daily. 07/09/20   Verta Ellen., NP  Vitamin D, Ergocalciferol, (DRISDOL) 1.25 MG (50000 UNIT) CAPS capsule Take 50,000 Units by mouth every Sunday.  12/31/19   [provider]    Allergies    Penicillins  Review of Systems   Review of Systems  Cardiovascular: Positive for chest pain.  Gastrointestinal: Negative for abdominal pain.  All other systems reviewed and are negative.   Physical Exam Updated Vital Signs BP 130/60 (BP Location: Right Arm)   Pulse 87   Temp 97.9 F (36.6  C) (Oral)   Resp 16   Ht 5\' 5"  (1.651 m)   Wt 95.3 kg   SpO2 98%   BMI 34.95 kg/m   Physical Exam Vitals and nursing note reviewed.  Constitutional:      Appearance: She is well-developed.  HENT:     Head: Normocephalic.  Cardiovascular:     Rate and Rhythm: Normal rate and regular rhythm.     Heart sounds: Normal heart sounds.  Pulmonary:     Effort: Pulmonary effort is normal.     Breath sounds: Normal breath sounds.  Abdominal:     General: There is no distension.  Musculoskeletal:        General: Normal range of motion.     Cervical back: Normal range of motion.  Neurological:     General: No focal deficit present.     Mental Status: She is alert and oriented to person, place, and time.     ED Results / Procedures / Treatments   Labs (all labs ordered are listed, but only abnormal results are displayed) Labs Reviewed  CBC - Abnormal; Notable for the following components:      Result Value   WBC 11.2 (*)    RBC 3.78 (*)    MCV 102.9 (*)    All other components within normal limits  RESP PANEL BY RT-PCR (FLU A&B, COVID) ARPGX2  BASIC METABOLIC PANEL  TROPONIN I (HIGH SENSITIVITY)    EKG None  Radiology DG Chest 2 View  Result Date: 01/09/2021 CLINICAL DATA:  Chest pain EXAM: CHEST - 2 VIEW COMPARISON:  October 07, 2020 FINDINGS: Right IJ central venous catheter with tip overlying the right atrium. Enlarged cardiac silhouette with central vascular prominence. Aortic atherosclerosis. Vascular stent and surgical clips overlie the left upper mediastinum. Prior median sternotomy and CABG. Mild diffuse interstitial thickening. No visible pleural effusion or pneumothorax IMPRESSION: Enlarged cardiac silhouette with central vascular prominence and probable  mild interstitial edema. Electronically Signed   By: Dahlia Bailiff MD   On: 01/09/2021 17:02    Procedures .Critical Care Performed by: Fransico Meadow, PA-C Authorized by: Fransico Meadow, PA-C   Critical  care provider statement:    Critical care time (minutes):  45   Critical care start time:  01/09/2021 5:30 AM   Critical care end time:  01/09/2021 7:18 PM   Critical care was necessary to treat or prevent imminent or life-threatening deterioration of the following conditions:  Cardiac failure   Critical care was time spent personally by me on the following activities:  Development of treatment plan with patient or surrogate, discussions with consultants, evaluation of patient's response to treatment, examination of patient, review of old charts, re-evaluation of patient's condition, pulse oximetry, ordering and review of radiographic studies, ordering and review of laboratory studies and ordering and performing treatments and interventions   I assumed direction of critical care for this patient from another provider in my specialty: no     Care discussed with: admitting provider       Medications Ordered in ED Medications  nitroGLYCERIN 50 mg in dextrose 5 % 250 mL (0.2 mg/mL) infusion (5 mcg/min Intravenous New Bag/Given 01/09/21 1733)  heparin bolus via infusion 4,000 Units (has no administration in time range)  heparin ADULT infusion 100 units/mL (25000 units/229mL) (has no administration in time range)  nitroGLYCERIN 0.2 mg/mL in dextrose 5 % infusion (has no administration in time range)    ED Course  I have reviewed the triage vital signs and the nursing notes.  Pertinent labs & imaging results that were available during my care of the patient were reviewed by me and considered in my medical decision making (see chart for details).    MDM Rules/Calculators/A&P                          MDM:  Pt pain free after nitro drip.   I spoke to Dr. Rudi Rummage Cardiology Fellow. Who advised admit to medicine at [facility].  They will follow.  Final Clinical Impression(s) / ED Diagnoses Final diagnoses:  Chest pain due to myocardial ischemia, unspecified ischemic chest pain type    Rx / DC  Orders ED Discharge Orders    None       Sidney Ace 01/09/21 1918    Isla Pence, MD 01/09/21 2149

## 2021-01-09 NOTE — ED Notes (Signed)
No c/o chest following NTg drip being hung.

## 2021-01-09 NOTE — Progress Notes (Signed)
ANTICOAGULATION CONSULT NOTE - Initial Consult  Pharmacy Consult for Heparin Indication: chest pain/ACS  Allergies  Allergen Reactions  . Penicillins Other (See Comments)    REACTION: Unknown, told as a child Has patient had a PCN reaction causing immediate rash, facial/tongue/throat swelling, SOB or lightheadedness with hypotension: Unknown Has patient had a PCN reaction causing severe rash involving mucus membranes or skin necrosis: Unknown Has patient had a PCN reaction that required hospitalization: Unknown Has patient had a PCN reaction occurring within the last 10 years: No If all of the above answers are "NO", then may proceed with Cephalosporin use.     Patient Measurements: Height: 5\' 5"  (165.1 cm) Weight: 95.3 kg (210 lb) IBW/kg (Calculated) : 57 HEPARIN DW (KG): 78.5  Vital Signs: Temp: 97.9 F (36.6 C) (04/24 1631) Temp Source: Oral (04/24 1631) BP: 131/63 (04/24 1920) Pulse Rate: 79 (04/24 1920)  Labs: Recent Labs    01/09/21 1715 01/09/21 1903  HGB 12.5  --   HCT 38.9  --   PLT 174  --   CREATININE 5.86*  --   TROPONINIHS 80* 141*    Estimated Creatinine Clearance: 11.4 mL/min (A) (by C-G formula based on SCr of 5.86 mg/dL (H)).   Medical History: Past Medical History:  Diagnosis Date  . Anemia   . Anxiety   . Asthma   . CAD (coronary artery disease)    Multivessel s/p CABG 2005, numerous PCIs since that time and documented graft disease  . Carotid artery disease (HCC)    R CEA  . ESRD on hemodialysis ([place])   . Essential hypertension   . Gout   . History of blood transfusion   . Hyperlipidemia   . Hypothyroidism   . Myocardial infarction ([place])   . PAD (peripheral artery disease) ([place])    Dr. Kellie Simmering  . Pneumonia 09/2019, 11/2019  . S/P angioplasty with stent- DES to [facility] and to LIMA to LAD with DES 04/09/18.   04/10/2018  . SBO (small bowel obstruction) ([place]) 2011   Status post lysis of adhesions & hernia repair  . Sinus bradycardia   .  Type 2 diabetes mellitus ([place])   . Umbilical hernia     Medications:  (Not in a hospital admission)   Assessment: 65yo female started on Heparin for ACS.  Heparin initiated with bolus of 4000 units and infusion at 1250 units/hr.  Goal of Therapy:  Heparin level 0.3-0.7 units/ml Monitor platelets by anticoagulation protocol: Yes   Plan:  Continue Heparin infusion at 1250 units/hr Check HL 8 hours after heparin initiated Monitor for s/sx bleeding complications  Hart Robinsons A 01/09/2021,9:33 PM

## 2021-01-09 NOTE — Consult Note (Signed)
Cardiology Consultation:   Patient ID: Donna Howe MRN: [id number]; DOB: 1956-04-07  Admit date: 01/09/2021 Date of Consult: 01/10/2021  PCP:  Curlene Labrum, [place]  Cardiologist:  Rozann Lesches, MD  Advanced Practice Provider:  No care team member to display Electrophysiologist:  None        Patient Profile:  65 y.o.femalewith hx of CAD s/p CABG in 2005 and multiple PCI's, HTN, DM, HLD, CKD stage V/ESRD on HD, PAD s/p stenting, deconditioning, obesity type 1, chronic anemia/thrombocytopnea presented to [facility] with c/o chest pain.  PTCA to SVG-OM1 in 01/2004,PTCA/DES to LM and prox LCxin 03/2004 and DES to SVG-OM1, PTCA/DES to SVG-OMin 01/2011,PTCA/DES to LM and LCxin 2015, andcutting balloon angioplasty to prox Cx and DES to SVG-RCAin 05/2017, NSTEMI in 03/2018 with DES to mid-RCA and DES to LIMA-LAD, s/p LHC: 2021-99% LM ISR with 50-60% stenosis of LIMA-LAD, occluded SVG-lPDA, severe native vessel disease, and patent SVG-dRCA.  History of Present Illness:   65 y.o.femalewith hx of CAD s/p CABG in 2005 and multiple PCI's, HTN, DM, HLD, CKD stage V/ESRD on HD, PAD s/p stenting, deconditioning, obesity type 1, chronic anemia/thrombocytopnea presented to [facility] with c/o chest pain.  Her last hospitalization: 10/1-10/13 for NSTEMI, AKI, hyperkalemia underwent LHC 06/22/20-99% LM ISR with 50-60% stenosis of LIMA-LAD, occluded SVG-lPDA, severe native vessel disease, and patent SVG-dRCA. Re-eval for CABG was considered but thought to be high risk, so medical management was her best option.  Today, the patient presents with c/o chest pain that began this morning she was doing grocery, chest pain was in the left side of chest with radiation to the back and left shoulder, she states that she took 1 nitroglycerin and the pain subsided.  On returning home, chest pain reoccurred while doing a task, chest pain was 10/10 on pain scale, it  was sharp in nature, nonreproducible and was associated with sweating and nausea.  She took 2 tablets of nitroglycerin and the chest pain subsided again.  Shortly after this, chest pain returned again, so she activated EMS and patient was sent to the ED for further evaluation and management.  She denies fever, chills, shortness of breath, headache, blurry vision, numbness or tingling.  ER course: got aspirin, heparin gtt, nitro gtt, coivd negative, CXR- interstitial markings. EKG: ST depression in inferior and anterolateral leads Subsequent EKG shows 0.16mm aVR ST elevation and signficant 80mm ST depression in inferior and anterior leads V4-V6 Trops: 80-> 141->209  On arrival to [facility]- hemodynamically stable, no further chest pain, new set of labs pending. She also reports her BP goes down with HD and may be they are pulling to much fluids. Not on imdur due to hypotension, takes ranexa 500mg  daily (bid makes her dizzy).   Past Medical History:  Diagnosis Date  . Anemia   . Anxiety   . Asthma   . CAD (coronary artery disease)    Multivessel s/p CABG 2005, numerous PCIs since that time and documented graft disease  . Carotid artery disease (HCC)    R CEA  . ESRD on hemodialysis ([place])   . Essential hypertension   . Gout   . History of blood transfusion   . Hyperlipidemia   . Hypothyroidism   . Myocardial infarction ([place])   . PAD (peripheral artery disease) ([place])    Dr. Kellie Simmering  . Pneumonia 09/2019, 11/2019  . S/P angioplasty with stent- DES to mRCA and to LIMA to LAD with DES  04/09/18.   04/10/2018  . SBO (small bowel obstruction) ([place]) 2011   Status post lysis of adhesions & hernia repair  . Sinus bradycardia   . Type 2 diabetes mellitus ([place])   . Umbilical hernia     Past Surgical History:  Procedure Laterality Date  . AORTIC ARCH ANGIOGRAPHY N/A 08/02/2020   Procedure: AORTIC ARCH ANGIOGRAPHY;  Surgeon: Waynetta Sandy, MD;  Location: [place] CV LAB;  Service:  Cardiovascular;  Laterality: N/A;  Lt upper extermity  . AV FISTULA PLACEMENT Left 06/29/2020   Procedure: LEFT ARM ARTERIOVENOUS (AV) FISTULA;  Surgeon: Waynetta Sandy, MD;  Location: [place];  Service: Vascular;  Laterality: Left;  ARM  . CESAREAN SECTION  1984  . CHOLECYSTECTOMY  2010  . CORONARY ARTERY BYPASS GRAFT  2005  . CORONARY BALLOON ANGIOPLASTY N/A 05/31/2017   Procedure: CORONARY BALLOON ANGIOPLASTY;  Surgeon: Jettie Booze, MD;  Location: [place] CV LAB;  Service: Cardiovascular;  Laterality: N/A;  . CORONARY STENT INTERVENTION N/A 05/31/2017   Procedure: CORONARY STENT INTERVENTION;  Surgeon: Jettie Booze, MD;  Location: [place] CV LAB;  Service: Cardiovascular;  Laterality: N/A;  . CORONARY STENT INTERVENTION N/A 04/09/2018   Procedure: CORONARY STENT INTERVENTION;  Surgeon: Jettie Booze, MD;  Location: [place] [facility];  Service: Cardiovascular;  Laterality: N/A;  SVG RCA  . ENDARTERECTOMY Right 04/18/2013   Procedure: ENDARTERECTOMY CAROTID;  Surgeon: Mal Misty, MD;  Location: [place];  Service: Vascular;  Laterality: Right;  . HERNIA REPAIR  1989  . Incisional hernia repair x2  03/04/2010   Laparoscopic with 35cm mesh by Dr Ronnald Collum  . IR FLUORO GUIDE CV LINE RIGHT  06/21/2020  . IR US GUIDE VASC ACCESS RIGHT  06/21/2020  . LEFT HEART CATH AND CORS/GRAFTS ANGIOGRAPHY N/A 05/31/2017   Procedure: LEFT HEART CATH AND CORS/GRAFTS ANGIOGRAPHY;  Surgeon: Jettie Booze, MD;  Location: [place] CV LAB;  Service: Cardiovascular;  Laterality: N/A;  . LEFT HEART CATH AND CORS/GRAFTS ANGIOGRAPHY N/A 04/08/2018   Procedure: LEFT HEART CATH AND CORS/GRAFTS ANGIOGRAPHY;  Surgeon: Jettie Booze, MD;  Location: [place] CV LAB;  Service: Cardiovascular;  Laterality: N/A;  . LEFT HEART CATH AND CORS/GRAFTS ANGIOGRAPHY N/A 06/22/2020   Procedure: LEFT HEART CATH AND CORS/GRAFTS ANGIOGRAPHY;  Surgeon: Belva Crome, MD;  Location: [place] CV LAB;  Service: Cardiovascular;  Laterality: N/A;  . LEFT HEART CATHETERIZATION WITH CORONARY ANGIOGRAM N/A 12/19/2012   Procedure: LEFT HEART CATHETERIZATION WITH CORONARY ANGIOGRAM;  Surgeon: Josue Hector, MD;  Location: [facility] CATH LAB;  Service: Cardiovascular;  Laterality: N/A;  . LEFT HEART CATHETERIZATION WITH CORONARY/GRAFT ANGIOGRAM N/A 04/19/2013   Procedure: LEFT HEART CATHETERIZATION WITH Beatrix Fetters;  Surgeon: Lorretta Harp, MD;  Location: [facility] CATH LAB;  Service: Cardiovascular;  Laterality: N/A;  . LIGATION OF ARTERIOVENOUS  FISTULA Left 09/15/2020   Procedure: LIGATION OF LEFT ARM ARTERIOVENOUS  FISTULA;  Surgeon: Waynetta Sandy, MD;  Location: [place];  Service: Vascular;  Laterality: Left;  . PATCH ANGIOPLASTY Right 04/18/2013   Procedure: PATCH ANGIOPLASTY Right Internal Carotid Artery;  Surgeon: Mal Misty, MD;  Location: [place];  Service: Vascular;  Laterality: Right;  . PERCUTANEOUS CORONARY STENT INTERVENTION (PCI-S) Right 12/19/2012   Procedure: PERCUTANEOUS CORONARY STENT INTERVENTION (PCI-S);  Surgeon: Josue Hector, MD;  Location: [facility] CATH LAB;  Service: Cardiovascular;  Laterality: Right;  . PERIPHERAL VASCULAR INTERVENTION Left 08/02/2020   Procedure: PERIPHERAL VASCULAR  INTERVENTION;  Surgeon: Waynetta Sandy, MD;  Location: [place] CV LAB;  Service: Cardiovascular;  Laterality: Left;  Left subclavian  . SHOULDER SURGERY       Home Medications:  Prior to Admission medications   Medication Sig Start Date End Date Taking? Authorizing Provider  Albuterol Sulfate 108 (90 Base) MCG/ACT AEPB Inhale 2 puffs into the lungs every 4 (four) hours as needed (Shortness of breath).     [provider]  allopurinol (ZYLOPRIM) 100 MG tablet Take one tablet after hemodialysis, on Tuesday, Thursday and Saturday. Patient taking differently: Take 100 mg by mouth Every Tuesday,Thursday,and Saturday with dialysis. After dialysis 06/30/20    Arrien, Jimmy Picket, MD  ALPRAZolam Duanne Moron) 0.5 MG tablet Take 0.25-0.5 mg by mouth See admin instructions. Take 0.25 in the morning and evening and 0.5 mg at bedtime    [provider]  aspirin EC 81 MG tablet Take 81 mg by mouth daily.     [provider]  atorvastatin (LIPITOR) 80 MG tablet Take 1 tablet (80 mg total) by mouth every evening. Patient taking differently: Take 80 mg by mouth at bedtime. 12/19/19   Johnson, Clanford L, MD  clopidogrel (PLAVIX) 75 MG tablet Take 1 tablet (75 mg total) by mouth daily. 08/27/18   Satira Sark, MD  diclofenac Sodium (VOLTAREN) 1 % GEL Apply 2 g topically 4 (four) times daily as needed (apply to right foot big toe as needed for pain.). 06/30/20   Arrien, Jimmy Picket, MD  ferric citrate (AURYXIA) 1 GM 210 MG(Fe) tablet Take 420 mg by mouth 3 (three) times daily with meals.    [provider]  gabapentin (NEURONTIN) 400 MG capsule Take 1 capsule (400 mg total) by mouth 2 (two) times daily. 06/17/20   Barton Dubois, MD  ipratropium (ATROVENT HFA) 17 MCG/ACT inhaler Inhale 2 puffs into the lungs every 4 (four) hours as needed for wheezing.     [provider]  levothyroxine (SYNTHROID) 112 MCG tablet Take 112 mcg by mouth daily before breakfast.    [provider]  nitroGLYCERIN (NITROSTAT) 0.4 MG SL tablet Place 0.4 mg under the tongue every 5 (five) minutes x 3 doses as needed for chest pain. Not to exceed 3 in 15 minute time frame    [provider]  NOVOLIN 70/30 FLEXPEN (70-30) 100 UNIT/ML KwikPen Inject 24 Units into the skin in the morning and at bedtime. 06/30/20   [provider]  ondansetron (ZOFRAN) 8 MG tablet Take 8 mg by mouth every 8 (eight) hours as needed for nausea. 07/12/20   [provider]  pantoprazole (PROTONIX) 40 MG tablet Take 1 tablet (40 mg total) by mouth 2 (two) times daily. 06/17/20   Barton Dubois, MD  ranolazine (RANEXA) 500 MG 12 hr tablet Take  1 tablet (500 mg total) by mouth 2 (two) times daily. 07/09/20   Verta Ellen., NP  Vitamin D, Ergocalciferol, (DRISDOL) 1.25 MG (50000 UNIT) CAPS capsule Take 50,000 Units by mouth every Sunday.  12/31/19   [provider]    Inpatient Medications: Scheduled Meds: . aspirin EC  81 mg Oral Daily  . atorvastatin  80 mg Oral QHS  . clopidogrel  75 mg Oral Daily  . insulin aspart  0-6 Units Subcutaneous Q4H  . levothyroxine  112 mcg Oral Q0600  . pantoprazole  40 mg Oral BID   Continuous Infusions: . heparin 1,250 Units/hr (01/09/21 1748)  . nitroGLYCERIN 5 mcg/min (01/09/21 1733)  PRN Meds: albuterol, ipratropium  Allergies:    Allergies  Allergen Reactions  . Penicillins Other (See Comments)    REACTION: Unknown, told as a child Has patient had a PCN reaction causing immediate rash, facial/tongue/throat swelling, SOB or lightheadedness with hypotension: Unknown Has patient had a PCN reaction causing severe rash involving mucus membranes or skin necrosis: Unknown Has patient had a PCN reaction that required hospitalization: Unknown Has patient had a PCN reaction occurring within the last 10 years: No If all of the above answers are "NO", then may proceed with Cephalosporin use.     Social History:   Social History   Socioeconomic History  . Marital status: Divorced    Spouse name: Not on file  . Number of children: Not on file  . Years of education: Not on file  . Highest education level: Not on file  Occupational History  . Occupation: Disabled  Tobacco Use  . Smoking status: Former Smoker    Packs/day: 1.00    Years: 20.00    Pack years: 20.00    Types: Cigarettes    Quit date: 12/10/2012    Years since quitting: 8.0  . Smokeless tobacco: Never Used  Vaping Use  . Vaping Use: Never used  Substance and Sexual Activity  . Alcohol use: No    Alcohol/week: 0.0 standard drinks  . Drug use: No  . Sexual activity: Not Currently  Other Topics Concern   . Not on file  Social History Narrative   Lives with mother.   Social Determinants of Health   Financial Resource Strain: Not on file  Food Insecurity: Not on file  Transportation Needs: Not on file  Physical Activity: Not on file  Stress: Not on file  Social Connections: Not on file  Intimate Partner Violence: Not on file    Family History:    Family History  Problem Relation Age of Onset  . Diabetes Mother   . Heart disease Mother        before age 63  . Hyperlipidemia Mother   . Hypertension Mother   . Thyroid disease Father   . Hypertension Father   . AAA (abdominal aortic aneurysm) Father   . Heart disease Brother        before age 37  . Hypertension Brother   . Hyperlipidemia Son   . Hypertension Son      ROS:  Please see the history of present illness.   All other ROS reviewed and negative.     Physical Exam/Data:   Vitals:   01/09/21 2240 01/09/21 2250 01/09/21 2300 01/10/21 0005  BP: (!) 149/84 (!) 167/64 (!) 175/66 (!) 181/76  Pulse: 80 74 83 73  Resp: (!) 21 (!) 22 19 18   Temp:    98 F (36.7 C)  TempSrc:    Oral  SpO2: 98% 97% 100% 97%  Weight:      Height:       No intake or output data in the 24 hours ending 01/10/21 0016 Last 3 Weights 01/09/2021 01/09/2021 01/05/2021  Weight (lbs) 210 lb 210 lb 210 lb  Weight (kg) 95.255 kg 95.255 kg 95.255 kg     Body mass index is 34.95 kg/m.  General:  Well nourished, well developed, in no acute distress HEENT: normal Lymph: no adenopathy Neck: mild JVD Endocrine:  No thryomegaly Vascular: No carotid bruits; FA pulses 2+ bilaterally without bruits  Cardiac:  normal S1, S2; RRR; no murmur  Lungs:  clear to auscultation bilaterally,  no wheezing, rhonchi or rales  Abd: soft, nontender, no hepatomegaly  Ext: 1+ edema Musculoskeletal:  No deformities, BUE and BLE strength normal and equal Skin: warm and dry  Neuro:  CNs 2-12 intact, no focal abnormalities noted Psych:  Normal affect    Laboratory  Data:  High Sensitivity Troponin:   Recent Labs  Lab 01/09/21 1715 01/09/21 1903 01/09/21 2100  TROPONINIHS 80* 141* 209*     Chemistry Recent Labs  Lab 01/09/21 1715  NA 136  K 4.0  CL 96*  CO2 27  GLUCOSE 205*  BUN 39*  CREATININE 5.86*  CALCIUM 9.5  GFRNONAA 8*  ANIONGAP 13    No results for input(s): PROT, ALBUMIN, AST, ALT, ALKPHOS, BILITOT in the last 168 hours. Hematology Recent Labs  Lab 01/09/21 1715  WBC 11.2*  RBC 3.78*  HGB 12.5  HCT 38.9  MCV 102.9*  MCH 33.1  MCHC 32.1  RDW 13.5  PLT 174   BNPNo results for input(s): BNP, PROBNP in the last 168 hours.  DDimer No results for input(s): DDIMER in the last 168 hours.   Radiology/Studies:  DG Chest 2 View  Result Date: 01/09/2021 CLINICAL DATA:  Chest pain EXAM: CHEST - 2 VIEW COMPARISON:  October 07, 2020 FINDINGS: Right IJ central venous catheter with tip overlying the right atrium. Enlarged cardiac silhouette with central vascular prominence. Aortic atherosclerosis. Vascular stent and surgical clips overlie the left upper mediastinum. Prior median sternotomy and CABG. Mild diffuse interstitial thickening. No visible pleural effusion or pneumothorax IMPRESSION: Enlarged cardiac silhouette with central vascular prominence and probable mild interstitial edema. Electronically Signed   By: Dahlia Bailiff MD   On: 01/09/2021 17:02    Echocardiogram 06/19/2020: 1. Left ventricular ejection fraction, by estimation, is 55 to 60%. The  left ventricle has normal function. The left ventricle has no regional  wall motion abnormalities.  2. The mitral valve is normal in structure. Mild to moderate mitral valve  regurgitation. No evidence of mitral stenosis.  3. Limited echocardiogram to reevalaute LV function and wall motion.   Cardiac catheterization 06/22/2020:  Occlusion of the sequential saphenous vein graft to the obtuse marginal and PDA (left).  Patent saphenous vein graft to the mid RCA. Patent  ostial to proximal saphenous vein graft stent. Patent stent in the mid RCA beyond the graft insertion site  Patent LIMA to mid LAD. Ostial LIMA stent has 50 to 70% ISR. Cath engagement causes damping of pressure.  Totally occluded proximal RCA  Functionally occluded left main with very faint opacification of the circumflex system despite the absence of a clear channel.  99% stenosis of the ostial to proximal circumflex stent.  Total occlusion of the proximal LAD.  Normal left ventricular end-diastolic pressure.  RECOMMENDATIONS:   Severe native and bypass graft disease. No obvious target for PCI this time. Revascularization strategy would likely include repeat coronary bypass surgery when left internal mammary ostial stent progresses.  PTCA to SVG-OM1 in 01/2004,PTCA/DES to LM and prox LCxin 03/2004 and DES to SVG-OM1, PTCA/DES to SVG-OMin 01/2011,PTCA/DES to LM and LCxin 2015, andcutting balloon angioplasty to prox Cx and DES to SVG-RCAin 05/2017, NSTEMI in 03/2018 with DES to mid-RCA and DES to LIMA-LAD.  Most recently admitted to [facility] end of9/2021 with NSTEMI. Hs troponin peaked at 160 at that time. Cath deferred due to advanced CKD. Treated with heparin for 48 hours. Remained on DAPT with ASA and plavix. Started on Ranexa  Assessment and Plan:   1. Chest pain/NSTEMI  2. CAD s/p CABG in 2005 s/p multiple PCIs ( [place] 2021- severe native dx, graft dx)  3. PAD s/p left subclavian artery stentin, DCB axillary artery (left) 4. ESRD on HD s/p AVF ligation, catheter 5. H/o bradycardia (not on BB) 6. H/o chronic anemia/thrombocytopenia 7. HLD 8. Deconditioning Poorly controlled DM-2 hypothyroidism  Plan:  - transferred to [facility] for further evaluation - continue aspirin 81mg , atorva 80mg  daily. Plavix has been on hold, not on BB  Due to bradycardia (in previous admissions) - contiue heparin gtt and nitro gtt (aim for SBP ~ 110-120s and chest pain free)  (previously was  on antianginal therarpy- but d/c'd imdur due to low BP and currently taking ranexa 500mg  daily (BID causes dizziness).. - trend trops/ekgs - obtain ECHO in am - NPO after MN for LHC/CA in am. This needs to be discussed as the patient has known severe disease and last LHC 06/2020- had ISR of LIMA stent- Interventionalist felt no obvious target for PCI.  - ESRD on HD : volume removal per Neprhology. HD (TTS) - poorly controlled DM-2, maintain euglycemia - full code.  Risk Assessment/Risk Scores:     TIMI Risk Score for Unstable Angina or Non-ST Elevation MI:   The patient's TIMI risk score is 5, which indicates a 26% risk of all cause mortality, new or recurrent myocardial infarction or need for urgent revascularization in the next 14 days.          For questions or updates, please contact [place] Please consult [email or web address] for contact info under    Signed, Renae Fickle, MD  01/10/2021 12:16 AM

## 2021-01-09 NOTE — ED Notes (Signed)
Pt has left upper extremity restriction due to assessment for fistula/graft placement. Pt has double-lumen Dialysis access in upper right chest.

## 2021-01-09 NOTE — H&P (Addendum)
History and Physical  Donna Howe QMG:[id number] DOB: September 22, 1955 DOA: 01/09/2021  Referring physician: Fransico Meadow, PA-C PCP: Curlene Labrum, MD  Patient coming from: Home  Chief Complaint: Chest pain  HPI: Donna Howe is a 65 y.o. female with medical history significant for ESRD on HD (TTS), T2 DM, hyperlipidemia, hypertension and CAD s/p CABG (2005) and angioplasty with stent (DES to [facility] and to LAD and to [facility] 2019), hypothyroidism, GERD who presents to the emergency department due to chest pain.  Patient complained of a chest pain which started this morning while she was doing grocery, chest pain was in the left side of chest with radiation to the back and left shoulder, she states that she took 1 nitroglycerin and the pain subsided.  On returning home, chest pain reoccurred while doing a task, chest pain was 10/10 on pain scale, it was sharp in nature, nonreproducible and was associated with sweating and nausea.  She took 2 tablets of nitroglycerin and the chest pain subsided again.  Shortly after this, chest pain returned again, so she activated EMS and patient was sent to the ED for further evaluation and management.  She denies fever, chills, shortness of breath, headache, blurry vision, numbness or tingling.  ED Course:  In the emergency department, patient was hemodynamically stable.  Work-up in the ED showed mild leukocytosis, elevated MCV, BUN/creatinine 39/5.86, hyperglycemia, high-sensitivity troponin-80 > 141.  Influenza A, B and SARS coronavirus 2 was negative Chest x-ray showed enlarged cardiac silhouette with central vascular prominence and probable mild interstitial edema Patient was started on IV heparin drip and IV nitroglycerin drip was started.  Patient is chest pain-free at this time. Cardiology fellow at [facility] was consulted and recommended admitting patient to [facility] for cardiology follow-up per ED physician and ED medical record.  Review of  Systems: Constitutional: Negative for chills and fever.  HENT: Negative for ear pain and sore throat.   Eyes: Negative for pain and visual disturbance.  Respiratory: Negative for cough, chest tightness and shortness of breath.   Cardiovascular: Positive for chest pain and diaphoresis.  Negative for palpitations.  Gastrointestinal: Positive for nausea.  Negative for abdominal pain and vomiting.  Endocrine: Negative for polyphagia and polyuria.  Genitourinary: Negative for decreased urine volume, dysuria, enuresis Musculoskeletal: Negative for arthralgias and back pain.  Skin: Negative for color change and rash.  Allergic/Immunologic: Negative for immunocompromised state.  Neurological: Negative for tremors, syncope, speech difficulty, weakness, light-headedness and headaches.  Hematological: Does not bruise/bleed easily.  All other systems reviewed and are negative   Past Medical History:  Diagnosis Date  . Anemia   . Anxiety   . Asthma   . CAD (coronary artery disease)    Multivessel s/p CABG 2005, numerous PCIs since that time and documented graft disease  . Carotid artery disease (HCC)    R CEA  . ESRD on hemodialysis ([place])   . Essential hypertension   . Gout   . History of blood transfusion   . Hyperlipidemia   . Hypothyroidism   . Myocardial infarction ([place])   . PAD (peripheral artery disease) ([place])    Dr. Kellie Simmering  . Pneumonia 09/2019, 11/2019  . S/P angioplasty with stent- DES to [facility] and to LIMA to LAD with DES 04/09/18.   04/10/2018  . SBO (small bowel obstruction) ([place]) 2011   Status post lysis of adhesions & hernia repair  . Sinus bradycardia   . Type 2 diabetes mellitus ([place])   .  Umbilical hernia    Past Surgical History:  Procedure Laterality Date  . AORTIC ARCH ANGIOGRAPHY N/A 08/02/2020   Procedure: AORTIC ARCH ANGIOGRAPHY;  Surgeon: Waynetta Sandy, MD;  Location: [place] [facility];  Service: Cardiovascular;  Laterality: N/A;  Lt upper extermity  .  AV FISTULA PLACEMENT Left 06/29/2020   Procedure: LEFT ARM ARTERIOVENOUS (AV) FISTULA;  Surgeon: Waynetta Sandy, MD;  Location: [place];  Service: Vascular;  Laterality: Left;  ARM  . CESAREAN SECTION  1984  . CHOLECYSTECTOMY  2010  . CORONARY ARTERY BYPASS GRAFT  2005  . CORONARY BALLOON ANGIOPLASTY N/A 05/31/2017   Procedure: CORONARY BALLOON ANGIOPLASTY;  Surgeon: Jettie Booze, MD;  Location: [place] [facility];  Service: Cardiovascular;  Laterality: N/A;  . CORONARY STENT INTERVENTION N/A 05/31/2017   Procedure: CORONARY STENT INTERVENTION;  Surgeon: Jettie Booze, MD;  Location: [place] [facility];  Service: Cardiovascular;  Laterality: N/A;  . CORONARY STENT INTERVENTION N/A 04/09/2018   Procedure: CORONARY STENT INTERVENTION;  Surgeon: Jettie Booze, MD;  Location: [place] [facility];  Service: Cardiovascular;  Laterality: N/A;  SVG RCA  . ENDARTERECTOMY Right 04/18/2013   Procedure: ENDARTERECTOMY CAROTID;  Surgeon: Mal Misty, MD;  Location: [place];  Service: Vascular;  Laterality: Right;  . HERNIA REPAIR  1989  . Incisional hernia repair x2  03/04/2010   Laparoscopic with 35cm mesh by Dr Ronnald Collum  . IR FLUORO GUIDE CV LINE RIGHT  06/21/2020  . IR US GUIDE VASC ACCESS RIGHT  06/21/2020  . LEFT HEART CATH AND CORS/GRAFTS ANGIOGRAPHY N/A 05/31/2017   Procedure: LEFT HEART CATH AND CORS/GRAFTS ANGIOGRAPHY;  Surgeon: Jettie Booze, MD;  Location: [place] [facility];  Service: Cardiovascular;  Laterality: N/A;  . LEFT HEART CATH AND CORS/GRAFTS ANGIOGRAPHY N/A 04/08/2018   Procedure: LEFT HEART CATH AND CORS/GRAFTS ANGIOGRAPHY;  Surgeon: Jettie Booze, MD;  Location: [place] [facility];  Service: Cardiovascular;  Laterality: N/A;  . LEFT HEART CATH AND CORS/GRAFTS ANGIOGRAPHY N/A 06/22/2020   Procedure: LEFT HEART CATH AND CORS/GRAFTS ANGIOGRAPHY;  Surgeon: Belva Crome, MD;  Location: [place] [facility];  Service: Cardiovascular;  Laterality: N/A;   . LEFT HEART CATHETERIZATION WITH CORONARY ANGIOGRAM N/A 12/19/2012   Procedure: LEFT HEART CATHETERIZATION WITH CORONARY ANGIOGRAM;  Surgeon: Josue Hector, MD;  Location: [facility] C[facility] LAB;  Service: Cardiovascular;  Laterality: N/A;  . LEFT HEART CATHETERIZATION WITH CORONARY/GRAFT ANGIOGRAM N/A 04/19/2013   Procedure: LEFT HEART CATHETERIZATION WITH Beatrix Fetters;  Surgeon: Lorretta Harp, MD;  Location: [facility] CATH LAB;  Service: Cardiovascular;  Laterality: N/A;  . LIGATION OF ARTERIOVENOUS  FISTULA Left 09/15/2020   Procedure: LIGATION OF LEFT ARM ARTERIOVENOUS  FISTULA;  Surgeon: Waynetta Sandy, MD;  Location: [place];  Service: Vascular;  Laterality: Left;  . PATCH ANGIOPLASTY Right 04/18/2013   Procedure: PATCH ANGIOPLASTY Right Internal Carotid Artery;  Surgeon: Mal Misty, MD;  Location: [place];  Service: Vascular;  Laterality: Right;  . PERCUTANEOUS CORONARY STENT INTERVENTION (PCI-S) Right 12/19/2012   Procedure: PERCUTANEOUS CORONARY STENT INTERVENTION (PCI-S);  Surgeon: Josue Hector, MD;  Location: [facility] CATH LAB;  Service: Cardiovascular;  Laterality: Right;  . PERIPHERAL VASCULAR INTERVENTION Left 08/02/2020   Procedure: PERIPHERAL VASCULAR INTERVENTION;  Surgeon: Waynetta Sandy, MD;  Location: [place] [facility];  Service: Cardiovascular;  Laterality: Left;  Left subclavian  . SHOULDER SURGERY      Social History:  reports that she quit  smoking about 8 years ago. Her smoking use included cigarettes. She has a 20.00 pack-year smoking history. She has never used smokeless tobacco. She reports that she does not drink alcohol and does not use drugs.   Allergies  Allergen Reactions  . Penicillins Other (See Comments)    REACTION: Unknown, told as a child Has patient had a PCN reaction causing immediate rash, facial/tongue/throat swelling, SOB or lightheadedness with hypotension: Unknown Has patient had a PCN reaction causing severe rash involving mucus  membranes or skin necrosis: Unknown Has patient had a PCN reaction that required hospitalization: Unknown Has patient had a PCN reaction occurring within the last 10 years: No If all of the above answers are "NO", then may proceed with Cephalosporin use.     Family History  Problem Relation Age of Onset  . Diabetes Mother   . Heart disease Mother        before age 36  . Hyperlipidemia Mother   . Hypertension Mother   . Thyroid disease Father   . Hypertension Father   . AAA (abdominal aortic aneurysm) Father   . Heart disease Brother        before age 13  . Hypertension Brother   . Hyperlipidemia Son   . Hypertension Son      Prior to Admission medications   Medication Sig Start Date End Date Taking? Authorizing Provider  Albuterol Sulfate 108 (90 Base) MCG/ACT AEPB Inhale 2 puffs into the lungs every 4 (four) hours as needed (Shortness of breath).     [provider]  allopurinol (ZYLOPRIM) 100 MG tablet Take one tablet after hemodialysis, on Tuesday, Thursday and Saturday. Patient taking differently: Take 100 mg by mouth Every Tuesday,Thursday,and Saturday with dialysis. After dialysis 06/30/20   Arrien, Jimmy Picket, MD  ALPRAZolam Duanne Moron) 0.5 MG tablet Take 0.25-0.5 mg by mouth See admin instructions. Take 0.25 in the morning and evening and 0.5 mg at bedtime    [provider]  aspirin EC 81 MG tablet Take 81 mg by mouth daily.     [provider]  atorvastatin (LIPITOR) 80 MG tablet Take 1 tablet (80 mg total) by mouth every evening. Patient taking differently: Take 80 mg by mouth at bedtime. 12/19/19   Johnson, Clanford L, MD  clopidogrel (PLAVIX) 75 MG tablet Take 1 tablet (75 mg total) by mouth daily. 08/27/18   Satira Sark, MD  diclofenac Sodium (VOLTAREN) 1 % GEL Apply 2 g topically 4 (four) times daily as needed (apply to right foot big toe as needed for pain.). 06/30/20   Arrien, Jimmy Picket, MD  ferric citrate (AURYXIA) 1 GM 210  MG(Fe) tablet Take 420 mg by mouth 3 (three) times daily with meals.    [provider]  gabapentin (NEURONTIN) 400 MG capsule Take 1 capsule (400 mg total) by mouth 2 (two) times daily. 06/17/20   Barton Dubois, MD  ipratropium (ATROVENT HFA) 17 MCG/ACT inhaler Inhale 2 puffs into the lungs every 4 (four) hours as needed for wheezing.     [provider]  levothyroxine (SYNTHROID) 112 MCG tablet Take 112 mcg by mouth daily before breakfast.    [provider]  levothyroxine (SYNTHROID) 125 MCG tablet Take 125 mcg by mouth daily before breakfast.    [provider]  nitroGLYCERIN (NITROSTAT) 0.4 MG SL tablet Place 0.4 mg under the tongue every 5 (five) minutes x 3 doses as needed for chest pain. Not to exceed 3 in 15 minute time frame  [provider]  NOVOLIN 70/30 FLEXPEN (70-30) 100 UNIT/ML KwikPen Inject 24 Units into the skin in the morning and at bedtime. 06/30/20   [provider]  ondansetron (ZOFRAN) 8 MG tablet Take 8 mg by mouth every 8 (eight) hours as needed for nausea. 07/12/20   [provider]  pantoprazole (PROTONIX) 40 MG tablet Take 1 tablet (40 mg total) by mouth 2 (two) times daily. 06/17/20   Barton Dubois, MD  ranolazine (RANEXA) 500 MG 12 hr tablet Take 1 tablet (500 mg total) by mouth 2 (two) times daily. 07/09/20   Verta Ellen., NP  Vitamin D, Ergocalciferol, (DRISDOL) 1.25 MG (50000 UNIT) CAPS capsule Take 50,000 Units by mouth every Sunday.  12/31/19   [provider]    Physical Exam: BP (!) 131/58   Pulse 76   Temp 97.9 F (36.6 C) (Oral)   Resp (!) 22   Ht 5\' 5"  (1.651 m)   Wt 95.3 kg   SpO2 95%   BMI 34.95 kg/m   . General: 65 y.o. year-old female obese in no acute distress.  Alert and oriented x3. Marland Kitchen HEENT: NCAT, EOMI . Neck: Supple, trachea medial . Cardiovascular: Regular rate and rhythm with no rubs or gallops.  No thyromegaly or JVD noted.  No lower extremity edema. 2/4  pulses in all 4 extremities. Marland Kitchen Respiratory: Clear to auscultation with no wheezes or rales. Good inspiratory effort. . Abdomen: Soft nontender nondistended with normal bowel sounds x4 quadrants. . Muskuloskeletal: Vascular stent noted in left upper chest area.  No cyanosis, clubbing or edema noted bilaterally . Neuro: CN II-XII intact, strength 5/5 x 4, sensation, reflexes intact . Skin: No ulcerative lesions noted or rashes . Psychiatry: Judgement and insight appear normal. Mood is appropriate for condition and setting          Labs on Admission:  Basic Metabolic Panel: Recent Labs  Lab 01/09/21 1715  NA 136  K 4.0  CL 96*  CO2 27  GLUCOSE 205*  BUN 39*  CREATININE 5.86*  CALCIUM 9.5   Liver Function Tests: No results for input(s): AST, ALT, ALKPHOS, BILITOT, PROT, ALBUMIN in the last 168 hours. No results for input(s): LIPASE, AMYLASE in the last 168 hours. No results for input(s): AMMONIA in the last 168 hours. CBC: Recent Labs  Lab 01/09/21 1715  WBC 11.2*  HGB 12.5  HCT 38.9  MCV 102.9*  PLT 174   Cardiac Enzymes: No results for input(s): CKTOTAL, CKMB, CKMBINDEX, TROPONINI in the last 168 hours.  BNP (last 3 results) Recent Labs    06/18/20 1549  BNP 2,129.0*    ProBNP (last 3 results) No results for input(s): PROBNP in the last 8760 hours.  CBG: Recent Labs  Lab 01/09/21 2155  GLUCAP 179*    Radiological Exams on Admission: DG Chest 2 View  Result Date: 01/09/2021 CLINICAL DATA:  Chest pain EXAM: CHEST - 2 VIEW COMPARISON:  October 07, 2020 FINDINGS: Right IJ central venous catheter with tip overlying the right atrium. Enlarged cardiac silhouette with central vascular prominence. Aortic atherosclerosis. Vascular stent and surgical clips overlie the left upper mediastinum. Prior median sternotomy and CABG. Mild diffuse interstitial thickening. No visible pleural effusion or pneumothorax IMPRESSION: Enlarged cardiac silhouette with central vascular  prominence and probable mild interstitial edema. Electronically Signed   By: Dahlia Bailiff MD   On: 01/09/2021 17:02    EKG: I independently viewed the EKG done and my findings are as followed: Initial EKG  showed sinus rhythm at rate of 92 bpm with occasional PVCs, ST and T wave abnormalities with prolonged QTc (516 ms). Subsequent EKG showed normal sinus rhythm at a rate of 85 bpm with repolarization abnormality and T wave inversion in leads I and aVL and ST depression in V3-V5.   Assessment/Plan Present on Admission: . Chest pain . Hyperlipidemia . CAD (coronary artery disease) . NSTEMI (non-ST elevated myocardial infarction) ([place]) . Hypothyroidism  Principal Problem:   Chest pain Active Problems:   Hyperlipidemia   Obesity (BMI 30-39.9)   CAD (coronary artery disease)   Hx of CABG   NSTEMI (non-ST elevated myocardial infarction) ([place])   Hypothyroidism   Elevated troponin I level   Uncontrolled type 2 diabetes mellitus with hyperglycemia, with long-term current use of insulin (HCC)   GERD (gastroesophageal reflux disease)   Leukocytosis   Elevated MCV   Diabetic neuropathy (HCC)   Chest pain possibly secondary to NSTEMI Elevated troponin in the setting of above Patient presents with chest pain which resolved with nitroglycerin Troponin x2- 80 > 141; continue to trend troponin level EKG personally reviewed with T wave inversion and ST depression in anterolateral leads Continue IV heparin drip and IV nitroglycerin drip Continue aspirin, Plavix and Lipitor Cardiology fellow at [facility] (Dr. Rudi Rummage) was consulted and recommended admitting patient [facility], so the cardiology team will follow up with the patient per ED physician and ED medical record.  History of CAD s/p CABG and stent placement 04/09/2018 heart catheterization--ost LAD-prox LAD 80%, ost 1st margina 80%, mid RCA 90%, LIMA-LAD 70%; DES x 2 placed Continue aspirin, Plavix and Lipitor  ESRD on HD  (TTS) Last hemodialysis was yesterday (4/23) Nephrology will be consulted for maintenance dialysis while patient is inpatient  Hyperglycemia secondary to poorly controlled T2DM Continue ISS and hypoglycemia protocol  Leukocytosis possibly reactive WBC 11.2, no obvious signs for any acute infectious process at this time Continue to monitor WBC with morning labs  Anemia (mixed) Elevated MCV (102.9) Patient has anemia of chronic disease possibly secondary to end-stage renal disease MCV chronically elevated at 102.9, folate and vitamin B12 levels will be checked  Hypothyroidism Continue Synthroid  Hyperlipidemia Continue Lipitor  Diabetic neuropathy Continue gabapentin per home regimen  Obesity (BMI 34.95) Patient will be counseled on diet and lifestyle modification  DVT prophylaxis: Insulin drip  Code Status: Full code  Family Communication: None at bedside  Disposition Plan:  Patient is from:                        home Anticipated DC to:                   SNF or family members home Anticipated DC date:               2-3 days Anticipated DC barriers:          Patient requires inpatient management due to chest pain possibly due to NSTEMI and pending cardiology consult   Consults called: Cardiology  Admission status: Inpatient   Bernadette Hoit MD [facility]  01/09/2021, 10:36 PM

## 2021-01-09 NOTE — ED Notes (Signed)
Date and time results received: 01/09/21 @ 2212 (use smartphrase ".now" to insert current time)  Test: Troponin Critical Value: 209 Name of Provider Notified: [place] Orders Received? None yet Or Actions Taken?: None

## 2021-01-09 NOTE — ED Notes (Signed)
Date and time results received: 01/09/21 2000   Test: TROPONIN Critical Value: 141  Name of Provider Notified: Josephine Cables, DO

## 2021-01-10 ENCOUNTER — Encounter (HOSPITAL_COMMUNITY)
Admission: EM | Disposition: A | Discharge status: Home / Self Care | Payer: Self-pay | Source: Home / Self Care | Attending: Internal Medicine

## 2021-01-10 ENCOUNTER — Other Ambulatory Visit: Payer: Self-pay

## 2021-01-10 DIAGNOSIS — I2510 Atherosclerotic heart disease of native coronary artery without angina pectoris: Secondary | ICD-10-CM

## 2021-01-10 DIAGNOSIS — I214 Non-ST elevation (NSTEMI) myocardial infarction: Secondary | ICD-10-CM

## 2021-01-10 DIAGNOSIS — N186 End stage renal disease: Secondary | ICD-10-CM

## 2021-01-10 DIAGNOSIS — I25110 Atherosclerotic heart disease of native coronary artery with unstable angina pectoris: Secondary | ICD-10-CM

## 2021-01-10 DIAGNOSIS — I200 Unstable angina: Secondary | ICD-10-CM

## 2021-01-10 DIAGNOSIS — I25710 Atherosclerosis of autologous vein coronary artery bypass graft(s) with unstable angina pectoris: Secondary | ICD-10-CM

## 2021-01-10 HISTORY — PX: LEFT HEART CATH AND CORS/GRAFTS ANGIOGRAPHY: CATH118250

## 2021-01-10 HISTORY — PX: CORONARY STENT INTERVENTION: CATH118234

## 2021-01-10 LAB — TROPONIN I (HIGH SENSITIVITY)
Troponin I (High Sensitivity): 333 ng/L (ref <18)
Troponin I (High Sensitivity): 345 ng/L (ref <18)
Troponin I (High Sensitivity): 391 ng/L (ref <18)

## 2021-01-10 LAB — CBC
HCT: 33.1 % — ABNORMAL LOW (ref 36.0–46.0)
HCT: 37.3 % (ref 36.0–46.0)
Hemoglobin: 10.7 g/dL — ABNORMAL LOW (ref 12.0–15.0)
Hemoglobin: 11.9 g/dL — ABNORMAL LOW (ref 12.0–15.0)
MCH: 32.9 pg (ref 26.0–34.0)
MCH: 32.6 pg (ref 26.0–34.0)
MCHC: 32.3 g/dL (ref 30.0–36.0)
MCHC: 31.9 g/dL (ref 30.0–36.0)
MCV: 101.8 fL — ABNORMAL HIGH (ref 80.0–100.0)
MCV: 102.2 fL — ABNORMAL HIGH (ref 80.0–100.0)
Platelets: 139 10*3/uL — ABNORMAL LOW (ref 150–400)
Platelets: 150 10*3/uL (ref 150–400)
RBC: 3.25 MIL/uL — ABNORMAL LOW (ref 3.87–5.11)
RBC: 3.65 MIL/uL — ABNORMAL LOW (ref 3.87–5.11)
RDW: 13.3 % (ref 11.5–15.5)
RDW: 13.3 % (ref 11.5–15.5)
WBC: 8.8 10*3/uL (ref 4.0–10.5)
WBC: 9.4 10*3/uL (ref 4.0–10.5)
nRBC: 0 % (ref 0.0–0.2)
nRBC: 0 % (ref 0.0–0.2)

## 2021-01-10 LAB — HIV ANTIBODY (ROUTINE TESTING W REFLEX): HIV Screen 4th Generation wRfx: NONREACTIVE

## 2021-01-10 LAB — COMPREHENSIVE METABOLIC PANEL
ALT: 14 U/L (ref 0–44)
AST: 15 U/L (ref 15–41)
Albumin: 2.8 g/dL — ABNORMAL LOW (ref 3.5–5.0)
Alkaline Phosphatase: 78 U/L (ref 38–126)
Anion gap: 12 (ref 5–15)
BUN: 42 mg/dL — ABNORMAL HIGH (ref 8–23)
CO2: 27 mmol/L (ref 22–32)
Calcium: 9 mg/dL (ref 8.9–10.3)
Chloride: 98 mmol/L (ref 98–111)
Creatinine, Ser: 6.14 mg/dL — ABNORMAL HIGH (ref 0.44–1.00)
GFR, Estimated: 7 mL/min — ABNORMAL LOW (ref >60)
Glucose, Bld: 223 mg/dL — ABNORMAL HIGH (ref 70–99)
Potassium: 3.8 mmol/L (ref 3.5–5.1)
Sodium: 137 mmol/L (ref 135–145)
Total Bilirubin: 0.5 mg/dL (ref 0.3–1.2)
Total Protein: 5.8 g/dL — ABNORMAL LOW (ref 6.5–8.1)

## 2021-01-10 LAB — POCT ACTIVATED CLOTTING TIME
Activated Clotting Time: 345 seconds
Activated Clotting Time: 291 seconds
Activated Clotting Time: 232 seconds
Activated Clotting Time: 178 seconds

## 2021-01-10 LAB — GLUCOSE, CAPILLARY
Glucose-Capillary: 127 mg/dL — ABNORMAL HIGH (ref 70–99)
Glucose-Capillary: 156 mg/dL — ABNORMAL HIGH (ref 70–99)
Glucose-Capillary: 164 mg/dL — ABNORMAL HIGH (ref 70–99)
Glucose-Capillary: 173 mg/dL — ABNORMAL HIGH (ref 70–99)
Glucose-Capillary: 208 mg/dL — ABNORMAL HIGH (ref 70–99)
Glucose-Capillary: 211 mg/dL — ABNORMAL HIGH (ref 70–99)
Glucose-Capillary: 219 mg/dL — ABNORMAL HIGH (ref 70–99)

## 2021-01-10 LAB — HEPARIN LEVEL (UNFRACTIONATED): Heparin Unfractionated: 0.21 IU/mL — ABNORMAL LOW (ref 0.30–0.70)

## 2021-01-10 LAB — HEMOGLOBIN A1C
Hgb A1c MFr Bld: 7 % — ABNORMAL HIGH (ref 4.8–5.6)
Mean Plasma Glucose: 154.2 mg/dL

## 2021-01-10 LAB — PHOSPHORUS: Phosphorus: 5.6 mg/dL — ABNORMAL HIGH (ref 2.5–4.6)

## 2021-01-10 LAB — FOLATE: Folate: 38.9 ng/mL (ref >5.9)

## 2021-01-10 LAB — PROTIME-INR
INR: 1.1 (ref 0.8–1.2)
Prothrombin Time: 14.4 seconds (ref 11.4–15.2)

## 2021-01-10 LAB — APTT: aPTT: 61 seconds — ABNORMAL HIGH (ref 24–36)

## 2021-01-10 LAB — MRSA PCR SCREENING: MRSA by PCR: NEGATIVE

## 2021-01-10 LAB — VITAMIN B12: Vitamin B-12: 252 pg/mL (ref 180–914)

## 2021-01-10 LAB — MAGNESIUM: Magnesium: 1.9 mg/dL (ref 1.7–2.4)

## 2021-01-10 SURGERY — LEFT HEART CATH AND CORS/GRAFTS ANGIOGRAPHY
Anesthesia: LOCAL

## 2021-01-10 MED ORDER — VERAPAMIL HCL 2.5 MG/ML IV SOLN
INTRAVENOUS | Status: DC | PRN
  Administered 2021-01-10: 100 ug via INTRACORONARY

## 2021-01-10 MED ORDER — ONDANSETRON HCL 4 MG/2ML IJ SOLN
INTRAMUSCULAR | Status: AC
  Filled 2021-01-10: qty 2

## 2021-01-10 MED ORDER — GABAPENTIN 400 MG PO CAPS
400.0000 mg | ORAL_CAPSULE | Freq: Two times a day (BID) | ORAL | Status: DC
  Administered 2021-01-10 – 2021-01-11 (×2): 400 mg via ORAL
  Filled 2021-01-10 (×2): qty 1

## 2021-01-10 MED ORDER — CLOPIDOGREL BISULFATE 300 MG PO TABS
ORAL_TABLET | ORAL | Status: AC
  Filled 2021-01-10: qty 1

## 2021-01-10 MED ORDER — HEPARIN (PORCINE) IN NACL 1000-0.9 UT/500ML-% IV SOLN
INTRAVENOUS | Status: AC
  Filled 2021-01-10: qty 500

## 2021-01-10 MED ORDER — SODIUM CHLORIDE 0.9% FLUSH: 3.0000 mL | Freq: Two times a day (BID) | INTRAVENOUS | Status: DC

## 2021-01-10 MED ORDER — ACETAMINOPHEN 325 MG PO TABS: 650.0000 mg | ORAL_TABLET | ORAL | Status: DC | PRN

## 2021-01-10 MED ORDER — SODIUM CHLORIDE 0.9 % IV SOLN: INTRAVENOUS | Status: DC

## 2021-01-10 MED ORDER — LABETALOL HCL 5 MG/ML IV SOLN
INTRAVENOUS | Status: DC | PRN
  Administered 2021-01-10: 10 mg via INTRAVENOUS

## 2021-01-10 MED ORDER — CHLORHEXIDINE GLUCONATE CLOTH 2 % EX PADS
6.0000 | MEDICATED_PAD | Freq: Every day | CUTANEOUS | Status: DC
  Administered 2021-01-11: 6 via TOPICAL

## 2021-01-10 MED ORDER — SODIUM CHLORIDE 0.9 % IV SOLN: 250.0000 mL | INTRAVENOUS | Status: DC | PRN

## 2021-01-10 MED ORDER — CLOPIDOGREL BISULFATE 75 MG PO TABS
75.0000 mg | ORAL_TABLET | Freq: Every day | ORAL | Status: DC
  Administered 2021-01-10 – 2021-01-11 (×2): 75 mg via ORAL
  Filled 2021-01-10: qty 1

## 2021-01-10 MED ORDER — HEPARIN (PORCINE) 25000 UT/250ML-% IV SOLN
1350.0000 [IU]/h | INTRAVENOUS | Status: DC
  Administered 2021-01-10: 1350 [IU]/h via INTRAVENOUS
  Filled 2021-01-10: qty 250

## 2021-01-10 MED ORDER — SODIUM CHLORIDE 0.9% FLUSH: 3.0000 mL | INTRAVENOUS | Status: DC | PRN

## 2021-01-10 MED ORDER — HYDRALAZINE HCL 20 MG/ML IJ SOLN: 10.0000 mg | INTRAMUSCULAR | Status: AC | PRN

## 2021-01-10 MED ORDER — LABETALOL HCL 5 MG/ML IV SOLN: 10.0000 mg | INTRAVENOUS | Status: AC | PRN

## 2021-01-10 MED ORDER — HEPARIN (PORCINE) IN NACL 2-0.9 UNITS/ML: INTRAMUSCULAR | Status: DC | PRN

## 2021-01-10 MED ORDER — INSULIN ISOPHANE & REGULAR (HUMAN 70-30)100 UNIT/ML KWIKPEN: 15.0000 [IU] | PEN_INJECTOR | Freq: Two times a day (BID) | SUBCUTANEOUS | Status: DC

## 2021-01-10 MED ORDER — SODIUM CHLORIDE 0.9 % IV SOLN
250.0000 mL | INTRAVENOUS | Status: DC | PRN
  Administered 2021-01-10: 250 mL via INTRAVENOUS

## 2021-01-10 MED ORDER — IOHEXOL 350 MG/ML SOLN
INTRAVENOUS | Status: DC | PRN
  Administered 2021-01-10: 115 mL via INTRA_ARTERIAL

## 2021-01-10 MED ORDER — LIDOCAINE HCL (PF) 1 % IJ SOLN
INTRAMUSCULAR | Status: AC
  Filled 2021-01-10: qty 30

## 2021-01-10 MED ORDER — CHLORHEXIDINE GLUCONATE CLOTH 2 % EX PADS
6.0000 | MEDICATED_PAD | Freq: Every day | CUTANEOUS | Status: DC
  Administered 2021-01-10 – 2021-01-11 (×2): 6 via TOPICAL

## 2021-01-10 MED ORDER — MIDAZOLAM HCL 2 MG/2ML IJ SOLN
INTRAMUSCULAR | Status: AC
  Filled 2021-01-10: qty 2

## 2021-01-10 MED ORDER — MIDAZOLAM HCL 2 MG/2ML IJ SOLN
INTRAMUSCULAR | Status: DC | PRN
  Administered 2021-01-10 (×2): 1 mg via INTRAVENOUS
  Administered 2021-01-10: 2 mg via INTRAVENOUS

## 2021-01-10 MED ORDER — HEPARIN SODIUM (PORCINE) 1000 UNIT/ML IJ SOLN
INTRAMUSCULAR | Status: DC | PRN
Start: 2021-01-10 — End: 2021-01-10
  Administered 2021-01-10: 9000 [IU] via INTRAVENOUS

## 2021-01-10 MED ORDER — LIDOCAINE HCL (PF) 1 % IJ SOLN
INTRAMUSCULAR | Status: DC | PRN
  Administered 2021-01-10: 10 mL

## 2021-01-10 MED ORDER — ASPIRIN 81 MG PO CHEW: 81.0000 mg | CHEWABLE_TABLET | ORAL | Status: DC

## 2021-01-10 MED ORDER — FENTANYL CITRATE (PF) 100 MCG/2ML IJ SOLN
INTRAMUSCULAR | Status: AC
  Filled 2021-01-10: qty 2

## 2021-01-10 MED ORDER — HEPARIN SODIUM (PORCINE) 5000 UNIT/ML IJ SOLN
5000.0000 [IU] | Freq: Three times a day (TID) | INTRAMUSCULAR | Status: DC
  Administered 2021-01-10 – 2021-01-11 (×2): 5000 [IU] via SUBCUTANEOUS
  Filled 2021-01-10 (×2): qty 1

## 2021-01-10 MED ORDER — FENTANYL CITRATE (PF) 100 MCG/2ML IJ SOLN
INTRAMUSCULAR | Status: DC | PRN
  Administered 2021-01-10 (×4): 25 ug via INTRAVENOUS

## 2021-01-10 MED ORDER — HEPARIN SODIUM (PORCINE) 1000 UNIT/ML IJ SOLN
INTRAMUSCULAR | Status: AC
  Filled 2021-01-10: qty 1

## 2021-01-10 MED ORDER — SODIUM CHLORIDE 0.9% FLUSH
3.0000 mL | Freq: Two times a day (BID) | INTRAVENOUS | Status: DC
  Administered 2021-01-10 – 2021-01-11 (×2): 3 mL via INTRAVENOUS

## 2021-01-10 MED ORDER — ONDANSETRON HCL 4 MG/2ML IJ SOLN: 4.0000 mg | Freq: Four times a day (QID) | INTRAMUSCULAR | Status: DC | PRN

## 2021-01-10 MED ORDER — HEPARIN (PORCINE) IN NACL 1000-0.9 UT/500ML-% IV SOLN
INTRAVENOUS | Status: DC | PRN
  Administered 2021-01-10 (×3): 500 mL

## 2021-01-10 MED ORDER — HYDRALAZINE HCL 20 MG/ML IJ SOLN
INTRAMUSCULAR | Status: AC
  Filled 2021-01-10: qty 1

## 2021-01-10 MED ORDER — INSULIN ASPART PROT & ASPART (70-30 MIX) 100 UNIT/ML ~~LOC~~ SUSP
15.0000 [IU] | Freq: Two times a day (BID) | SUBCUTANEOUS | Status: DC
  Administered 2021-01-10: 15 [IU] via SUBCUTANEOUS
  Filled 2021-01-10: qty 10

## 2021-01-10 MED ORDER — LABETALOL HCL 5 MG/ML IV SOLN
INTRAVENOUS | Status: AC
  Filled 2021-01-10: qty 4

## 2021-01-10 MED ORDER — HYDRALAZINE HCL 20 MG/ML IJ SOLN
INTRAMUSCULAR | Status: DC | PRN
  Administered 2021-01-10 (×2): 10 mg via INTRAVENOUS

## 2021-01-10 MED ORDER — CLOPIDOGREL BISULFATE 300 MG PO TABS
ORAL_TABLET | ORAL | Status: DC | PRN
  Administered 2021-01-10: 300 mg via ORAL

## 2021-01-10 MED ORDER — FAMOTIDINE IN NACL 20-0.9 MG/50ML-% IV SOLN
INTRAVENOUS | Status: AC
  Filled 2021-01-10: qty 50

## 2021-01-10 MED ORDER — HEPARIN (PORCINE) IN NACL 1000-0.9 UT/500ML-% IV SOLN
INTRAVENOUS | Status: AC
  Filled 2021-01-10: qty 1000

## 2021-01-10 MED ORDER — FAMOTIDINE IN NACL 20-0.9 MG/50ML-% IV SOLN
20.0000 mg | Freq: Once | INTRAVENOUS | Status: AC
  Administered 2021-01-10: 20 mg via INTRAVENOUS

## 2021-01-10 MED ORDER — MORPHINE SULFATE (PF) 2 MG/ML IV SOLN: 2.0000 mg | INTRAVENOUS | Status: AC | PRN

## 2021-01-10 MED ORDER — VERAPAMIL HCL 2.5 MG/ML IV SOLN
INTRAVENOUS | Status: AC
  Filled 2021-01-10: qty 2

## 2021-01-10 MED ORDER — ONDANSETRON HCL 4 MG/2ML IJ SOLN
INTRAMUSCULAR | Status: DC | PRN
  Administered 2021-01-10: 4 mg via INTRAVENOUS

## 2021-01-10 MED ORDER — RANOLAZINE ER 500 MG PO TB12
500.0000 mg | ORAL_TABLET | Freq: Every day | ORAL | Status: DC
  Administered 2021-01-10: 500 mg via ORAL
  Filled 2021-01-10 (×2): qty 1

## 2021-01-10 MED ORDER — IOHEXOL 350 MG/ML SOLN
INTRAVENOUS | Status: AC
  Filled 2021-01-10: qty 1

## 2021-01-10 MED ORDER — CHLORHEXIDINE GLUCONATE CLOTH 2 % EX PADS: 6.0000 | MEDICATED_PAD | Freq: Every day | CUTANEOUS | Status: DC

## 2021-01-10 SURGICAL SUPPLY — 23 items
CATH ANGIO 5F BER2 100CM (CATHETERS) ×2 IMPLANT
CATH EXPO 5F MPA-1 (CATHETERS) ×2 IMPLANT
CATH INFINITI 5 FR IM (CATHETERS) ×2 IMPLANT
CATH INFINITI 5FR AL1 (CATHETERS) ×2 IMPLANT
CATH INFINITI 5FR MULTPACK ANG (CATHETERS) ×2 IMPLANT
CATH LAUNCHER 6FR AR1 SH (CATHETERS) ×2 IMPLANT
CATH TEMPO AQUA 5F 100CM (CATHETERS) ×2 IMPLANT
GUIDEWIRE ANGLED .035X150CM (WIRE) ×2 IMPLANT
KIT ENCORE 26 ADVANTAGE (KITS) ×2 IMPLANT
KIT ESSENTIALS PG (KITS) ×2 IMPLANT
KIT HEART LEFT (KITS) ×2 IMPLANT
MAT PREVALON FULL STRYKER (MISCELLANEOUS) ×2 IMPLANT
PACK CARDIAC CATHETERIZATION (CUSTOM PROCEDURE TRAY) ×2 IMPLANT
SHEATH PINNACLE 5F 10CM (SHEATH) ×2 IMPLANT
SHEATH PINNACLE 6F 10CM (SHEATH) ×2 IMPLANT
STENT SYNERGY XD 2.50X16 (Permanent Stent) ×1 IMPLANT
SYNERGY XD 2.50X16 (Permanent Stent) ×2 IMPLANT
TRANSDUCER W/STOPCOCK (MISCELLANEOUS) ×2 IMPLANT
TUBING CIL FLEX 10 FLL-RA (TUBING) ×2 IMPLANT
WIRE ASAHI PROWATER 180CM (WIRE) ×2 IMPLANT
WIRE EMERALD 3MM-J .035X150CM (WIRE) ×2 IMPLANT
WIRE EMERALD 3MM-J .035X260CM (WIRE) ×2 IMPLANT
WIRE HI TORQ VERSACORE-J 145CM (WIRE) ×2 IMPLANT

## 2021-01-10 NOTE — Progress Notes (Signed)
Off unit for heart cath.

## 2021-01-10 NOTE — Progress Notes (Signed)
77fr sheath aspirated and removed from right femoral artery. Manual pressure applied for 20 minutes. Site level 0, no S+S of hematoma. Tegaderm dressing applied, bedrest instructions given.   Bilateral dp pulses palpable. Right pt pulse palpable, left pt intermittently palpable.  Bedrest begins at 20:00:00

## 2021-01-10 NOTE — Progress Notes (Addendum)
Progress Note  Patient Name: Donna Howe Date of Encounter: 01/10/2021  [facility] Cardiologist: Rozann Lesches, MD   Subjective   Patient still having mild recurrent intermittent chest pain on IV heparin and nitroglycerin drip.  Currently chest pain-free.  No shortness of breath.  Inpatient Medications    Scheduled Meds: . aspirin EC  81 mg Oral Daily  . atorvastatin  80 mg Oral QHS  . Chlorhexidine Gluconate Cloth  6 each Topical Daily  . Chlorhexidine Gluconate Cloth  6 each Topical Q0600  . clopidogrel  75 mg Oral Daily  . insulin aspart  0-6 Units Subcutaneous Q4H  . levothyroxine  112 mcg Oral Q0600  . pantoprazole  40 mg Oral BID  . ranolazine  500 mg Oral Daily   Continuous Infusions: . heparin 1,350 Units/hr (01/10/21 0747)  . nitroGLYCERIN 5 mcg/min (01/09/21 1733)   PRN Meds: albuterol, ipratropium   Vital Signs    Vitals:   01/09/21 2240 01/09/21 2250 01/09/21 2300 01/10/21 0005  BP: (!) 149/84 (!) 167/64 (!) 175/66 (!) 181/76  Pulse: 80 74 83 73  Resp: (!) 21 (!) 22 19 18   Temp:    98 F (36.7 C)  TempSrc:    Oral  SpO2: 98% 97% 100% 97%  Weight:      Height:        Intake/Output Summary (Last 24 hours) at 01/10/2021 0951 Last data filed at 01/10/2021 0543 Gross per 24 hour  Intake 167.52 ml  Output --  Net 167.52 ml   Last 3 Weights 01/09/2021 01/09/2021 01/05/2021  Weight (lbs) 210 lb 210 lb 210 lb  Weight (kg) 95.255 kg 95.255 kg 95.255 kg      Telemetry    Sinus rhythm, PVC- Personally Reviewed  ECG    No new tracing this morning  Physical Exam   GEN: No acute distress.   Neck: No JVD Cardiac: RRR, no murmurs, rubs, or gallops.  Respiratory: Clear to auscultation bilaterally. GI: Soft, nontender, non-distended  MS: No edema; No deformity. Neuro:  Nonfocal  Psych: Normal affect   Labs    High Sensitivity Troponin:   Recent Labs  Lab 01/09/21 1715 01/09/21 1903 01/09/21 2100  TROPONINIHS 80* 141* 209*       Chemistry Recent Labs  Lab 01/09/21 1715  NA 136  K 4.0  CL 96*  CO2 27  GLUCOSE 205*  BUN 39*  CREATININE 5.86*  CALCIUM 9.5  GFRNONAA 8*  ANIONGAP 13     Hematology Recent Labs  Lab 01/09/21 1715 01/10/21 0248 01/10/21 0801  WBC 11.2* 8.8 9.4  RBC 3.78* 3.25* 3.65*  HGB 12.5 10.7* 11.9*  HCT 38.9 33.1* 37.3  MCV 102.9* 101.8* 102.2*  MCH 33.1 32.9 32.6  MCHC 32.1 32.3 31.9  RDW 13.5 13.3 13.3  PLT 174 139* 150     Radiology    DG Chest 2 View  Result Date: 01/09/2021 CLINICAL DATA:  Chest pain EXAM: CHEST - 2 VIEW COMPARISON:  October 07, 2020 FINDINGS: Right IJ central venous catheter with tip overlying the right atrium. Enlarged cardiac silhouette with central vascular prominence. Aortic atherosclerosis. Vascular stent and surgical clips overlie the left upper mediastinum. Prior median sternotomy and CABG. Mild diffuse interstitial thickening. No visible pleural effusion or pneumothorax IMPRESSION: Enlarged cardiac silhouette with central vascular prominence and probable mild interstitial edema. Electronically Signed   By: Dahlia Bailiff MD   On: 01/09/2021 17:02    Cardiac Studies   N/A  Patient  Profile     65 y.o. female with hx of CAD s/p CABG in 2005 and multiple PCI's, HTN, DM, HLD, CKD stage V/ESRD on HD, PAD s/p stenting, deconditioning, obesity type 1, chronic anemia/thrombocytopnea presented to [facility] with c/o chest pain and transferred to [facility].   LHC: 2021-99% LM ISR with 50-60% stenosis of LIMA-LAD, occluded SVG-lPDA, severe native vessel disease, and patent SVG-dRCA>> medical therapy.   Assessment & Plan   1. NSTEMI with CAD s/p CABG and multiple PCIs -Last cardiac cath 6 months ago as above.  No target for PCI  With possible  repeat CABG. -Patient presented with unstable angina found to have elevated troponin.  EKG with more pronounced ST changes.  - Hs-troponin 80>>141>>209>>333>> Will cycle -Mild Intermittent chest discomfort despite  on nitroglycerin and heparin drip -Antianginal therapy is limited due to history of hypotension following dialysis (reported) -Continue aspirin 81 mg qd, Plavix 75 mg daily, Lipitor 80 mg daily and Ranexa 500 mg daily (dizziness on twice daily dose)  2.  ESRD on hemodialysis ([facility]) - Followed by nephrology  3. HTN  -Patient with reported history of hypotension following dialysis -Patient with elevated BP here>> patient will need antihypertensive.  Could consider amlodipine.  Will review with MD.  4. DM - HgbA1c 7.1 - Per primary team  Keep NPO.  Dr. Burt Knack to see for cath decision.   For questions or updates, please contact [place] Please consult [email or web address] for contact info under      SignedLeanor Kail, PA  01/10/2021, 9:51 AM    Patient seen, examined. Available data reviewed. Agree with findings, assessment, and plan as outlined by Robbie Lis, PA.  The patient is independently interviewed and examined.  She is alert, oriented, no distress.  JVP is normal, HEENT is normal, lungs are clear bilaterally, heart is regular rate and rhythm with a 2/6 ejection murmur at the right upper sternal border, abdomen is soft and nontender, extremities have no edema.  The patient presents with symptoms of unstable angina and mildly elevated troponin.  Her symptoms have clearly progressed and antianginal therapy is limited because of the presence of end-stage renal disease and hypotension following dialysis.  I reviewed her cardiac catheterization films from October 2021 where she had moderately severe stenosis within the stented segment of the proximal left internal mammary artery.  The saphenous vein graft to the mid RCA was patent and the stented segment in the RCA beyond the graft insertion site was patent with mild in-stent restenosis.  I agree that repeat cardiac catheterization is indicated in order to further evaluate revascularization options in this patient with progressive and  now unstable angina.  I think she would be a very poor candidate for redo CABG considering the presence of end-stage renal disease.  I have reviewed risk, indications, and alternatives to cardiac catheterization and PCI with the patient.  She understands and agrees to proceed.  She remains on dual antiplatelet therapy with aspirin and clopidogrel, both administered this morning.  Sherren Mocha, M.D. 01/10/2021 1:16 PM

## 2021-01-10 NOTE — Progress Notes (Signed)
Critical lab - Troponin 333  Patient is on heparin and nitro drip, patient with no complaints, no pain, planned for cath lab this morning.

## 2021-01-10 NOTE — Progress Notes (Signed)
PROGRESS NOTE  Donna Howe  DOB: 1955/10/11  PCP: Curlene Labrum, MD GGY:[id number]  DOA: 01/09/2021  LOS: 1 day   Chief Complaint  Patient presents with  . Chest Pain   Brief narrative: Donna Howe is a 65 y.o. female with PMH significant for ESRD-HD, CAD s/p CABG in 2005 and multiple PCI's, DM2, HTN, HLD, PAD s/p stenting, deconditioning, obesity type 1, chronic anemia/thrombocytopnea. Patient presented to the ED at [facility] on 4/24 with c/o left-sided chest pain while she was doing grocery.  Pain radiating to the back and left shoulder, improved with rest and nitroglycerin.  Chest pain later recurred at home with minimal task subsided again with 2 tablets of nitroglycerin.  Chest pain recurred for the third time and hence she called EMS and was brought to ED.   In the ED, patient was hemodynamically stable Troponin elevated to 80>141>209 EKG showed ST depressions in inferior and anterolateral leads.  Subsequent EKG showed a 0.5 mm ST elevation in aVR and 2 mm ST segment depression in inferior and anterior leads V5 to V6. Cardiology was consulted. She got a dose of aspirin, and was started on heparin drip. Admitted to hospitalist service  Subjective: Patient was seen and examined this morning.  Cardiology was at bedside. Pleasant middle-aged Caucasian female.  Not in distress Chart reviewed. Blood pressure running over 160 overnight.  Assessment/Plan: NSTEMI -Presented with chest pain, and a background of significant history of CAD. -Troponin trending up as below. -EKG with ST depression in anterolateral leads. -Per cardiology recommendation, patient was started on IV nitroglycerin drip and IV heparin drip.  Noted a plan for cardiac cath today. -Also on aspirin, Plavix and statin. Recent Labs    01/09/21 2100 01/10/21 0248 01/10/21 0948  TROPONINIHS 209* 333* 345*   History of CAD s/p CABG and stent placement Hyperlipidemia PAD s/p stenting -04/09/2018  heart catheterization--ost LAD-prox LAD 80%, ost 1st margina 80%, mid RCA 90%, LIMA-LAD 70%; DES x 2 placed -Continue aspirin, Plavix and Lipitor  ESRD-HD-TTS -Last hemodialysis on Saturday 4/23. -Nephrology consulted for maintenance of dialysis  Essential hypertension -I do not see blood pressure meds in her home med list. -Blood pressure was elevated at presentation 206/88.  Started on nitroglycerin drip.  Type 2 diabetes mellitus Hyperglycemia Diabetic neuropathy -Home meds include Novolin 70/30 twice a day 24 units  -Currently on sliding scale insulin with Accu-Cheks.   -Resumed Novolin 70/30 twice daily at 15 units this morning. -Continue Neurontin Recent Labs  Lab 01/09/21 2155 01/10/21 0041 01/10/21 0537 01/10/21 0819 01/10/21 1247  GLUCAP 179* 219* 208* 211* 156*   Macrocytic anemia of chronic disease -Stable hemoglobin Recent Labs    06/14/20 0834 06/14/20 0841 06/16/20 0604 06/17/20 0551 09/15/20 1000 10/07/20 0913 01/09/21 1715 01/10/21 0248 01/10/21 0801  HGB  --    < > 8.9*   < > 14.3 14.0 12.5 10.7* 11.9*  MCV  --    < > 95.0   < >  --  100.0 102.9* 101.8* 102.2*  VITAMINB12  --   --  199  --   --   --   --  252  --   FOLATE  --   --  8.3  --   --   --   --  38.9  --   FERRITIN 4*  --   --   --   --  505*  --   --   --   TIBC 239*  --   --   --   --   --   --   --   --  IRON 146  --   --   --   --   --   --   --   --    < > = values in this interval not displayed.   Hypothyroidism -Continue Synthroid  Mobility: Encourage ambulation Code Status:   Code Status: Full Code  Nutritional status: Body mass index is 34.95 kg/m.     Diet Order            Diet NPO time specified  Diet effective now                 DVT prophylaxis: SCDs Start: 01/09/21 2152   Antimicrobials:  None Fluid: None Consultants: Cardiology Family Communication:  Not at bedside  Status is: Inpatient  Remains inpatient appropriate because: Needs further  cardiac work-up  Dispo: The patient is from: Home              Anticipated d/c is to: Home              Patient currently is not medically stable to d/c.   Difficult to place patient No     Infusions:  . sodium chloride    . sodium chloride 10 mL/hr at 01/10/21 1320  . heparin Stopped (01/10/21 1323)  . nitroGLYCERIN 5 mcg/min (01/09/21 1733)    Scheduled Meds: . [START ON 01/11/2021] aspirin  81 mg Oral Pre-Cath  . [MAR Hold] aspirin EC  81 mg Oral Daily  . [MAR Hold] atorvastatin  80 mg Oral QHS  . [MAR Hold] Chlorhexidine Gluconate Cloth  6 each Topical Daily  . [MAR Hold] Chlorhexidine Gluconate Cloth  6 each Topical Q0600  . [MAR Hold] clopidogrel  75 mg Oral Daily  . [MAR Hold] insulin aspart  0-6 Units Subcutaneous Q4H  . [MAR Hold] insulin aspart protamine- aspart  15 Units Subcutaneous BID WC  . [MAR Hold] levothyroxine  112 mcg Oral Q0600  . [MAR Hold] pantoprazole  40 mg Oral BID  . [MAR Hold] ranolazine  500 mg Oral Daily  . [MAR Hold] sodium chloride flush  3 mL Intravenous Q12H    Antimicrobials: Anti-infectives (From admission, onward)   None      PRN meds: sodium chloride, [MAR Hold] albuterol, [MAR Hold] ipratropium, sodium chloride flush   Objective: Vitals:   01/10/21 0825 01/10/21 1330  BP: (!) 155/68   Pulse:    Resp: 18   Temp:    SpO2:  97%    Intake/Output Summary (Last 24 hours) at 01/10/2021 1337 Last data filed at 01/10/2021 0543 Gross per 24 hour  Intake 167.52 ml  Output --  Net 167.52 ml   Filed Weights   01/09/21 1631 01/09/21 1700  Weight: 95.3 kg 95.3 kg   Weight change:  Body mass index is 34.95 kg/m.   Physical Exam: General exam: Pleasant, middle-aged Caucasian female. Skin: No rashes, lesions or ulcers. HEENT: Atraumatic, normocephalic, no obvious bleeding Lungs: Clear to auscultation bilaterally CVS: Regular rate and rhythm, no murmur GI/Abd soft, nontender, nondistended, bowel sound present CNS: Alert, awake,  oriented x3 Psychiatry: Mood appropriate Extremities: No pedal edema, no calf tenderness  Data Review: I have personally reviewed the laboratory data and studies available.  Recent Labs  Lab 01/09/21 1715 01/10/21 0248 01/10/21 0801  WBC 11.2* 8.8 9.4  HGB 12.5 10.7* 11.9*  HCT 38.9 33.1* 37.3  MCV 102.9* 101.8* 102.2*  PLT 174 139* 150   Recent Labs  Lab 01/09/21 1715 01/10/21 0248  NA 136  137  K 4.0 3.8  CL 96* 98  CO2 27 27  GLUCOSE 205* 223*  BUN 39* 42*  CREATININE 5.86* 6.14*  CALCIUM 9.5 9.0  MG  --  1.9  PHOS  --  5.6*    F/u labs ordered Unresulted Labs (From admission, onward)          Start     Ordered   01/11/21 0500  Hemoglobin A1c  Tomorrow morning,   R        01/10/21 0956   01/11/21 0500  Lipid panel  Tomorrow morning,   R        01/10/21 0956   Signed and Held  Renal function panel  Once,   R        Signed and Held   Signed and Held  CBC  Once,   R        Signed and Held          Signed, Terrilee Croak, MD [facility] 01/10/2021

## 2021-01-10 NOTE — Progress Notes (Signed)
[place] for Heparin Indication: chest pain/ACS  Allergies  Allergen Reactions  . Penicillins Other (See Comments)    REACTION: Unknown, told as a child Has patient had a PCN reaction causing immediate rash, facial/tongue/throat swelling, SOB or lightheadedness with hypotension: Unknown Has patient had a PCN reaction causing severe rash involving mucus membranes or skin necrosis: Unknown Has patient had a PCN reaction that required hospitalization: Unknown Has patient had a PCN reaction occurring within the last 10 years: No If all of the above answers are "NO", then may proceed with Cephalosporin use.     Patient Measurements: Height: 5\' 5"  (165.1 cm) Weight: 95.3 kg (210 lb) IBW/kg (Calculated) : 57 HEPARIN DW (KG): 78.5  Vital Signs: Temp: 98 F (36.7 C) (04/25 0005) Temp Source: Oral (04/25 0005) BP: 181/76 (04/25 0005) Pulse Rate: 73 (04/25 0005)  Labs: Recent Labs    01/09/21 1715 01/09/21 1903 01/09/21 2100  HGB 12.5  --   --   HCT 38.9  --   --   PLT 174  --   --   CREATININE 5.86*  --   --   TROPONINIHS 80* 141* 209*    Estimated Creatinine Clearance: 11.4 mL/min (A) (by C-G formula based on SCr of 5.86 mg/dL (H)).   Medical History: Past Medical History:  Diagnosis Date  . Anemia   . Anxiety   . Asthma   . CAD (coronary artery disease)    Multivessel s/p CABG 2005, numerous PCIs since that time and documented graft disease  . Carotid artery disease (HCC)    R CEA  . ESRD on hemodialysis ([place])   . Essential hypertension   . Gout   . History of blood transfusion   . Hyperlipidemia   . Hypothyroidism   . Myocardial infarction ([place])   . PAD (peripheral artery disease) ([place])    Dr. Kellie Simmering  . Pneumonia 09/2019, 11/2019  . S/P angioplasty with stent- DES to [facility] and to LIMA to LAD with DES 04/09/18.   04/10/2018  . SBO (small bowel obstruction) ([place]) 2011   Status post lysis of adhesions & hernia repair  . Sinus  bradycardia   . Type 2 diabetes mellitus ([place])   . Umbilical hernia     Medications:  Medications Prior to Admission  Medication Sig Dispense Refill Last Dose  . Albuterol Sulfate 108 (90 Base) MCG/ACT AEPB Inhale 2 puffs into the lungs every 4 (four) hours as needed (Shortness of breath).      Marland Kitchen allopurinol (ZYLOPRIM) 100 MG tablet Take one tablet after hemodialysis, on Tuesday, Thursday and Saturday. (Patient taking differently: Take 100 mg by mouth Every Tuesday,Thursday,and Saturday with dialysis. After dialysis) 30 tablet 11   . ALPRAZolam (XANAX) 0.5 MG tablet Take 0.25-0.5 mg by mouth See admin instructions. Take 0.25 in the morning and evening and 0.5 mg at bedtime     . aspirin EC 81 MG tablet Take 81 mg by mouth daily.      Marland Kitchen atorvastatin (LIPITOR) 80 MG tablet Take 1 tablet (80 mg total) by mouth every evening. (Patient taking differently: Take 80 mg by mouth at bedtime.)     . clopidogrel (PLAVIX) 75 MG tablet Take 1 tablet (75 mg total) by mouth daily. 90 tablet 3   . diclofenac Sodium (VOLTAREN) 1 % GEL Apply 2 g topically 4 (four) times daily as needed (apply to right foot big toe as needed for pain.). 50 g 0   . ferric  citrate (AURYXIA) 1 GM 210 MG(Fe) tablet Take 420 mg by mouth 3 (three) times daily with meals.     . gabapentin (NEURONTIN) 400 MG capsule Take 1 capsule (400 mg total) by mouth 2 (two) times daily.     Marland Kitchen ipratropium (ATROVENT HFA) 17 MCG/ACT inhaler Inhale 2 puffs into the lungs every 4 (four) hours as needed for wheezing.      Marland Kitchen levothyroxine (SYNTHROID) 112 MCG tablet Take 112 mcg by mouth daily before breakfast.     . nitroGLYCERIN (NITROSTAT) 0.4 MG SL tablet Place 0.4 mg under the tongue every 5 (five) minutes x 3 doses as needed for chest pain. Not to exceed 3 in 15 minute time frame     . NOVOLIN 70/30 FLEXPEN (70-30) 100 UNIT/ML KwikPen Inject 24 Units into the skin in the morning and at bedtime.     . ondansetron (ZOFRAN) 8 MG tablet Take 8 mg by mouth  every 8 (eight) hours as needed for nausea.     . pantoprazole (PROTONIX) 40 MG tablet Take 1 tablet (40 mg total) by mouth 2 (two) times daily. 60 tablet 1   . ranolazine (RANEXA) 500 MG 12 hr tablet Take 1 tablet (500 mg total) by mouth 2 (two) times daily. 60 tablet 1   . Vitamin D, Ergocalciferol, (DRISDOL) 1.25 MG (50000 UNIT) CAPS capsule Take 50,000 Units by mouth every Sunday.        Assessment: 65yo female started on Heparin for ACS.  Heparin initiated with bolus of 4000 units and infusion at 1250 units/hr.  4/25 AM update:  Heparin level is 0.21 on lab server-NOT crossing into Epic  Goal of Therapy:  Heparin level 0.3-0.7 units/ml Monitor platelets by anticoagulation protocol: Yes   Plan:  Inc heparin to 1350 units/hr 1200 heparin level  Narda Bonds, PharmD, BCPS Clinical Pharmacist Phone: [phone]

## 2021-01-10 NOTE — Progress Notes (Addendum)
Progress Note  Patient Name: Donna Howe Date of Encounter: 01/10/2021  Primary Cardiologist: Rozann Lesches, MD   Subjective   Doing well, seen in HD this AM   Inpatient Medications    Scheduled Meds: . aspirin EC  81 mg Oral Daily  . atorvastatin  80 mg Oral QHS  . Chlorhexidine Gluconate Cloth  6 each Topical Daily  . Chlorhexidine Gluconate Cloth  6 each Topical Q0600  . clopidogrel  75 mg Oral Daily  . gabapentin  400 mg Oral BID  . heparin  5,000 Units Subcutaneous Q8H  . insulin aspart  0-6 Units Subcutaneous Q4H  . insulin aspart protamine- aspart  15 Units Subcutaneous BID WC  . levothyroxine  112 mcg Oral Q0600  . pantoprazole  40 mg Oral BID  . ranolazine  500 mg Oral Daily  . sodium chloride flush  3 mL Intravenous Q12H   Continuous Infusions: . sodium chloride    . nitroGLYCERIN 20 mcg/min (01/10/21 1533)   PRN Meds: sodium chloride, acetaminophen, albuterol, hydrALAZINE, ipratropium, labetalol, morphine injection, ondansetron (ZOFRAN) IV, sodium chloride flush   Vital Signs    Vitals:   01/09/21 2300 01/10/21 0005 01/10/21 0825 01/10/21 1330  BP: (!) 175/66 (!) 181/76 (!) 155/68   Pulse: 83 73    Resp: 19 18 18    Temp:  98 F (36.7 C)    TempSrc:  Oral    SpO2: 100% 97%  97%  Weight:      Height:        Intake/Output Summary (Last 24 hours) at 01/10/2021 1648 Last data filed at 01/10/2021 1611 Gross per 24 hour  Intake 168.79 ml  Output --  Net 168.79 ml   Filed Weights   01/09/21 1631 01/09/21 1700  Weight: 95.3 kg 95.3 kg    Physical Exam   General: Well developed, well nourished, NAD Neck: Negative for carotid bruits. No JVD Lungs:Clear to ausculation bilaterally. No wheezes, rales, or rhonchi. Breathing is unlabored. Cardiovascular: RRR with S1 S2. No murmurs Abdomen: Soft, non-tender, non-distended. No obvious abdominal masses. Extremities: No edema. Radial pulses 2+ bilaterally Neuro: Alert and oriented. No focal  deficits. No facial asymmetry. MAE spontaneously. Psych: Responds to questions appropriately with normal affect.    Labs    Chemistry Recent Labs  Lab 01/09/21 1715 01/10/21 0248  NA 136 137  K 4.0 3.8  CL 96* 98  CO2 27 27  GLUCOSE 205* 223*  BUN 39* 42*  CREATININE 5.86* 6.14*  CALCIUM 9.5 9.0  PROT  --  5.8*  ALBUMIN  --  2.8*  AST  --  15  ALT  --  14  ALKPHOS  --  78  BILITOT  --  0.5  GFRNONAA 8* 7*  ANIONGAP 13 12     Hematology Recent Labs  Lab 01/09/21 1715 01/10/21 0248 01/10/21 0801  WBC 11.2* 8.8 9.4  RBC 3.78* 3.25* 3.65*  HGB 12.5 10.7* 11.9*  HCT 38.9 33.1* 37.3  MCV 102.9* 101.8* 102.2*  MCH 33.1 32.9 32.6  MCHC 32.1 32.3 31.9  RDW 13.5 13.3 13.3  PLT 174 139* 150    Cardiac EnzymesNo results for input(s): TROPONINI in the last 168 hours. No results for input(s): TROPIPOC in the last 168 hours.   BNPNo results for input(s): BNP, PROBNP in the last 168 hours.   DDimer No results for input(s): DDIMER in the last 168 hours.   Radiology    DG Chest 2 View  Result Date: 01/09/2021 CLINICAL DATA:  Chest pain EXAM: CHEST - 2 VIEW COMPARISON:  October 07, 2020 FINDINGS: Right IJ central venous catheter with tip overlying the right atrium. Enlarged cardiac silhouette with central vascular prominence. Aortic atherosclerosis. Vascular stent and surgical clips overlie the left upper mediastinum. Prior median sternotomy and CABG. Mild diffuse interstitial thickening. No visible pleural effusion or pneumothorax IMPRESSION: Enlarged cardiac silhouette with central vascular prominence and probable mild interstitial edema. Electronically Signed   By: Dahlia Bailiff MD   On: 01/09/2021 17:02   CARDIAC CATHETERIZATION  Result Date: 01/10/2021  There is mild left ventricular systolic dysfunction. The left ventricular ejection fraction is 50-55% by visual estimate.  LV end diastolic pressure is moderately elevated.  There is no aortic valve stenosis.   -------------------------------------------  Ost LM to Dist LM STENT is 95% stenosed. Ost Cx to Mid Cx STENT is 99% stenosed.  JAILED 1st Mrg-1 lesion is 99% stenosed. 1st Mrg-2 lesion is 100% stenosed.  Ost LAD to Prox LAD lesion is 100% stenosed.  Prox RCA lesion is 100% stenosed.  LIMA graft was visualized by non-selective angiography and is normal in caliber. Origin to Prox Graft STENT is 60% stenosed.  SeqSVG OM-LPL graft was not visualized due to known occlusion. Origin to Prox Graft lesion before 1st Mrg is 100% stenosed.  SVG-mRCA graft was visualized by angiography. Origin STENT is 20% stenosed.  Prox SVG-RCA Graft lesion is 75% stenosed.  A drug-eluting stent was successfully placed (overlapping previous stent) using a SYNERGY XD 2.50X16. Postdilated to 2.75 mm.  Post intervention, there is a 0% residual stenosis.  Mid RCA to Dist RCA STENT (placed through SVG) is 30% stenosed.  SUMMARY  Likely culprit lesion-75% ulcerated lesion in SVG-RCA ->  s/p successful DES PCI overlapping ostial stent with Synergy DES 2.5 mg 60 mm postdilated to 2.75 mm.  Severe native CAD with occluded ostial LAD, 95% ostial OM1 with extensive stent ISR in the LM-LCx with least 95% stenosis RCA CTO.  Patent LIMA-LAD-but unable to engage the left subclavian artery due to stent extending into the aorta.  Nonselective imaging reveals TIMI-3 flow in the LIMA graft, there does appear to be some in-stent restenosis at least 50% in the ostial stent.  Known occlusion of the SVG-OM-LPL,  Severe Systemic Hypertension with moderate to severely elevated LVEDP 22 mmHg RECOMMENDATION  Return to nursing for ongoing care.  Consider noninvasive imaging such as Myoview to evaluate for anterior ischemia, if warranted, would then probably require left brachial access to allow for LIMA angiography and possible PCI.  Needs aggressive respiratory medication with blood pressure control  Patient noted heartburn related chest discomfort  along with nausea after being given additional 300 mg oral Plavix post PCI. [name], MD  Telemetry    01/11/21 NSR in the 60-70's - Personally Reviewed  ECG    No new tracing as of 01/11/21- Personally Reviewed  Cardiac Studies   LHC 01/10/21:   There is mild left ventricular systolic dysfunction. The left ventricular ejection fraction is 50-55% by visual estimate.  LV end diastolic pressure is moderately elevated.  There is no aortic valve stenosis.  -------------------------------------------  Ost LM to Dist LM STENT is 95% stenosed. Ost Cx to Mid Cx STENT is 99% stenosed.  JAILED 1st Mrg-1 lesion is 99% stenosed. 1st Mrg-2 lesion is 100% stenosed.  Ost LAD to Prox LAD lesion is 100% stenosed.  Prox RCA lesion is 100% stenosed.  LIMA graft was visualized by non-selective angiography  and is normal in caliber. Origin to Prox Graft STENT is 60% stenosed.  SeqSVG OM-LPL graft was not visualized due to known occlusion. Origin to Prox Graft lesion before 1st Mrg is 100% stenosed.  SVG-mRCA graft was visualized by angiography. Origin STENT is 20% stenosed.  Prox SVG-RCA Graft lesion is 75% stenosed.  A drug-eluting stent was successfully placed (overlapping previous stent) using a SYNERGY XD 2.50X16. Postdilated to 2.75 mm.  Post intervention, there is a 0% residual stenosis.  Mid RCA to Dist RCA STENT (placed through SVG) is 30% stenosed.   SUMMARY  Likely culprit lesion-75% ulcerated lesion in SVG-RCA ->  ? s/p successful DES PCI overlapping ostial stent with Synergy DES 2.5 mg 60 mm postdilated to 2.75 mm.  Severe native CAD with occluded ostial LAD, 95% ostial OM1 with extensive stent ISR in the LM-LCx with least 95% stenosis RCA CTO.  Patent LIMA-LAD-but unable to engage the left subclavian artery due to stent extending into the aorta.  Nonselective imaging reveals TIMI-3 flow in the LIMA graft, there does appear to be some in-stent restenosis at least 50% in  the ostial stent.  Known occlusion of the SVG-OM-LPL,   Severe Systemic Hypertension with moderate to severely elevated LVEDP 22 mmHg   RECOMMENDATION  Return to nursing for ongoing care.  Consider noninvasive imaging such as Myoview to evaluate for anterior ischemia, if warranted, would then probably require left brachial access to allow for LIMA angiography and possible PCI.  Needs aggressive respiratory medication with blood pressure control  Patient noted heartburn related chest discomfort along with nausea after being given additional 300 mg oral Plavix post PCI.  Diagnostic Dominance: Co-dominant    Intervention       Patient Profile     65 y.o. female with hx of CAD s/p CABG in 2005 and multiple PCI's, HTN, DM, HLD, CKD stageV/ESRD on HD, PAD s/p stenting, and chronic anemia/thrombocytopnea presented to [facility] with c/o chest pain. Pt transferred to [facility] for [place].   Assessment & Plan    1. NSTEMI with hx of CAD s/p CABG with multiple PCI interventions: -Last LHC 2021 with 99% LM ISR with 50-60% stenosis of LIMA-LAD, occluded SVG-lPDA, severe native vessel disease, and patent SVG-dRCA>> medical therapy recommended with no targets for PCI>>possible repeat CABG -EKG with pronounced ST changes and rising HsT 80>>141>>209>>333 -Continued to have intermittent chest pain yesterday with films reviewed by Dr. Burt Knack who agrees that repeat cath is indicated>>performed 01/10/21>>likely culprit lesion in the SVG to RCA 75% with successful PCI/DES overlapping synergy stents -Also with severe native CAD with occluded ostial LAD, 95% ostial OM1 with extensive stent ISR in the LM-LCx with least 95% stenosis RCA CTO, patent LIMA-LAD-but unable to engage the left subclavian artery due to stent extending into the aorta, and known occlusion of the SVG-OM-LPL>>recommendations to consider non invasive imaging to evaluate for anterior ischemia with possible LIMA angiography with  possible PCI -Continue ASA 81, Plavix 75, Lipitor 80 and Ranexa 500 (causes dizziness with increased dosing) -Has mild chest pressure today>>>not similar to admission pain   2. ESRD: -On HD TThS -Nephrology following  3. HTN: -Stable, 140/53>>148/56>>148/78 -Noted to have post HD hypotension>>>however hypertensive during hospital course -Consider amlodipine if BPs not improved after HD today   4. DM2: -Hb A1c, 7.0 -Per primary team   Signed, Kathyrn Drown NP-C [facility] Pager: [phone] 01/10/2021, 4:48 PM     For questions or updates, please contact   Please consult [email or web address] for contact  info under Cardiology/STEMI.  Patient seen, examined. Available data reviewed. Agree with findings, assessment, and plan as outlined by Kathyrn Drown, NP-C.  The patient is independently interviewed and examined.  She is alert, oriented, no distress.  Lungs are clear, HEENT is normal, heart is regular rate and rhythm with a 2/6 systolic murmur at the left upper sternal border radiating toward his left shoulder.  Abdomen is soft and nontender, right groin site is clear with no hematoma or ecchymosis.  Lower extremities have no edema.  The patient has tolerated hemodialysis this morning.  She denies any recurrent chest pain.  I think she is stable for hospital discharge from a cardiovascular perspective.  Her medications are reviewed and outlined above.  She should remain on dual antiplatelet therapy with aspirin and clopidogrel.  Her cardiac catheterization films were reviewed.  The left subclavian and left internal mammary arteries are imaged nonselectively.  There may be some restenosis in both the left subclavian stent and the left internal mammary artery stent, but there does not appear to be any severe/critical stenoses.  If the patient has recurrent anginal symptoms, it would be reasonable to pursue a Lexiscan Myoview stress test to evaluate for anterior wall ischemia.  If she requires future  intervention on either the LIMA or subclavian artery, this would need to be performed by a vascular specialist. Will arrange follow-up with Dr Domenic Polite or Bernerd Pho in our [place] office.   Sherren Mocha, M.D. 01/11/2021 1:39 PM

## 2021-01-10 NOTE — Consult Note (Signed)
[facility] Renal Consultation Note    Indication for Consultation:  Management of ESRD/hemodialysis, anemia, hypertension/volume, and secondary hyperparathyroidism.  HPI: Donna Howe is a 65 y.o. female with PMH including ESRD on dialysis, CAD s/p CABG in 2005, HTN, hypothyroidism, T2DM, and PAD who presented to the ED on 01/09/21 with chest pain. Troponins elevated with T wave inversation and ST depression. Patient was started on IV heparin and IV nitroglycerin and seen by cardiology, with plans for heart cath today. CXR did show mild interstitial edema. Labs notable for K+ 4.0, Ca 9.5, Cr 5.86, WBC 11.2, Hgb 12.5, Plt 174. Viral respiratory panel negative. Nephrology consulted for management of ESRD during admission. Patient reports last dialysis was Saturday. Reports she had a difficult session with low BP throughout and is wondering if her dry weight may be too low. She did miss dialysis on 4/19 due to car trouble. Reports she is currently using a TDC. Her AVF was ligated and she has plans to follow up with [facility] for new access planning later this month.   At time of exam, she reports mild chest discomfort but significantly improved since nitro drip. Denies SOB, orthopnea, cough, palpitations, dizziness, and abdominal pain. Reports she has some nausea when her pain was severe but none at present. Reports her R leg is always slightly swollen since vein harvest but no new edema. Denies any recent fevers/chills.   Past Medical History:  Diagnosis Date  . Anemia   . Anxiety   . Asthma   . CAD (coronary artery disease)    Multivessel s/p CABG 2005, numerous PCIs since that time and documented graft disease  . Carotid artery disease (HCC)    R CEA  . ESRD on hemodialysis ([place])   . Essential hypertension   . Gout   . History of blood transfusion   . Hyperlipidemia   . Hypothyroidism   . Myocardial infarction ([place])   . PAD (peripheral artery disease) ([place])    Dr. Kellie Simmering  .  Pneumonia 09/2019, 11/2019  . S/P angioplasty with stent- DES to [facility] and to [facility] to LAD with DES 04/09/18.   04/10/2018  . SBO (small bowel obstruction) ([place]) 2011   Status post lysis of adhesions & hernia repair  . Sinus bradycardia   . Type 2 diabetes mellitus ([place])   . Umbilical hernia    Past Surgical History:  Procedure Laterality Date  . AORTIC ARCH ANGIOGRAPHY N/A 08/02/2020   Procedure: AORTIC ARCH ANGIOGRAPHY;  Surgeon: Waynetta Sandy, MD;  Location: [place] [facility];  Service: Cardiovascular;  Laterality: N/A;  Lt upper extermity  . AV FISTULA PLACEMENT Left 06/29/2020   Procedure: LEFT ARM ARTERIOVENOUS (AV) FISTULA;  Surgeon: Waynetta Sandy, MD;  Location: [place];  Service: Vascular;  Laterality: Left;  ARM  . CESAREAN SECTION  1984  . CHOLECYSTECTOMY  2010  . CORONARY ARTERY BYPASS GRAFT  2005  . CORONARY BALLOON ANGIOPLASTY N/A 05/31/2017   Procedure: CORONARY BALLOON ANGIOPLASTY;  Surgeon: Jettie Booze, MD;  Location: [place] [facility];  Service: Cardiovascular;  Laterality: N/A;  . CORONARY STENT INTERVENTION N/A 05/31/2017   Procedure: CORONARY STENT INTERVENTION;  Surgeon: Jettie Booze, MD;  Location: [place] [facility];  Service: Cardiovascular;  Laterality: N/A;  . CORONARY STENT INTERVENTION N/A 04/09/2018   Procedure: CORONARY STENT INTERVENTION;  Surgeon: Jettie Booze, MD;  Location: [place] [facility];  Service: Cardiovascular;  Laterality: N/A;  SVG RCA  . ENDARTERECTOMY  Right 04/18/2013   Procedure: ENDARTERECTOMY CAROTID;  Surgeon: Mal Misty, MD;  Location: [place];  Service: Vascular;  Laterality: Right;  . HERNIA REPAIR  1989  . Incisional hernia repair x2  03/04/2010   Laparoscopic with 35cm mesh by Dr Ronnald Collum  . IR FLUORO GUIDE CV LINE RIGHT  06/21/2020  . IR US GUIDE VASC ACCESS RIGHT  06/21/2020  . LEFT HEART CATH AND CORS/GRAFTS ANGIOGRAPHY N/A 05/31/2017   Procedure: LEFT HEART CATH AND CORS/GRAFTS  ANGIOGRAPHY;  Surgeon: Jettie Booze, MD;  Location: [place] [facility];  Service: Cardiovascular;  Laterality: N/A;  . LEFT HEART CATH AND CORS/GRAFTS ANGIOGRAPHY N/A 04/08/2018   Procedure: LEFT HEART CATH AND CORS/GRAFTS ANGIOGRAPHY;  Surgeon: Jettie Booze, MD;  Location: [place] [facility];  Service: Cardiovascular;  Laterality: N/A;  . LEFT HEART CATH AND CORS/GRAFTS ANGIOGRAPHY N/A 06/22/2020   Procedure: LEFT HEART CATH AND CORS/GRAFTS ANGIOGRAPHY;  Surgeon: Belva Crome, MD;  Location: [place] [facility];  Service: Cardiovascular;  Laterality: N/A;  . LEFT HEART CATHETERIZATION WITH CORONARY ANGIOGRAM N/A 12/19/2012   Procedure: LEFT HEART CATHETERIZATION WITH CORONARY ANGIOGRAM;  Surgeon: Josue Hector, MD;  Location: [facility] C[facility] LAB;  Service: Cardiovascular;  Laterality: N/A;  . LEFT HEART CATHETERIZATION WITH CORONARY/GRAFT ANGIOGRAM N/A 04/19/2013   Procedure: LEFT HEART CATHETERIZATION WITH Beatrix Fetters;  Surgeon: Lorretta Harp, MD;  Location: [facility] CATH LAB;  Service: Cardiovascular;  Laterality: N/A;  . LIGATION OF ARTERIOVENOUS  FISTULA Left 09/15/2020   Procedure: LIGATION OF LEFT ARM ARTERIOVENOUS  FISTULA;  Surgeon: Waynetta Sandy, MD;  Location: [place];  Service: Vascular;  Laterality: Left;  . PATCH ANGIOPLASTY Right 04/18/2013   Procedure: PATCH ANGIOPLASTY Right Internal Carotid Artery;  Surgeon: Mal Misty, MD;  Location: [place];  Service: Vascular;  Laterality: Right;  . PERCUTANEOUS CORONARY STENT INTERVENTION (PCI-S) Right 12/19/2012   Procedure: PERCUTANEOUS CORONARY STENT INTERVENTION (PCI-S);  Surgeon: Josue Hector, MD;  Location: [facility] C[facility] LAB;  Service: Cardiovascular;  Laterality: Right;  . PERIPHERAL VASCULAR INTERVENTION Left 08/02/2020   Procedure: PERIPHERAL VASCULAR INTERVENTION;  Surgeon: Waynetta Sandy, MD;  Location: [place] [facility];  Service: Cardiovascular;  Laterality: Left;  Left subclavian  . SHOULDER SURGERY      Family History  Problem Relation Age of Onset  . Diabetes Mother   . Heart disease Mother        before age 66  . Hyperlipidemia Mother   . Hypertension Mother   . Thyroid disease Father   . Hypertension Father   . AAA (abdominal aortic aneurysm) Father   . Heart disease Brother        before age 39  . Hypertension Brother   . Hyperlipidemia Son   . Hypertension Son    Social History:  reports that she quit smoking about 8 years ago. Her smoking use included cigarettes. She has a 20.00 pack-year smoking history. She has never used smokeless tobacco. She reports that she does not drink alcohol and does not use drugs.  ROS: As per HPI otherwise negative.  Physical Exam: Vitals:   01/09/21 2240 01/09/21 2250 01/09/21 2300 01/10/21 0005  BP: (!) 149/84 (!) 167/64 (!) 175/66 (!) 181/76  Pulse: 80 74 83 73  Resp: (!) 21 (!) 22 19 18   Temp:    98 F (36.7 C)  TempSrc:    Oral  SpO2: 98% 97% 100% 97%  Weight:      Height:  General: Well developed, well nourished, in no acute distress. Head: Normocephalic, atraumatic, sclera non-icteric, mucus membranes are moist. Neck: JVD not elevated. Lungs: Clear bilaterally to auscultation without wheezes, rales, or rhonchi. Breathing is unlabored on RA Heart: RRR with normal S1, S2. No murmurs, rubs, or gallops appreciated. Abdomen: Soft, non-tender, non-distended with normoactive bowel sounds. No rebound/guarding. No obvious abdominal masses. Musculoskeletal:  Strength and tone appear normal for age. Lower extremities: Trace non-pitting edema RLE, no edema LLE Neuro: Alert and oriented X 3. Moves all extremities spontaneously. Psych:  Responds to questions appropriately with a normal affect. Dialysis Access: R IJ TDC with clean, dry bandage. LUE AVF with no bruit  Allergies  Allergen Reactions  . Penicillins Other (See Comments)    REACTION: Unknown, told as a child Has patient had a PCN reaction causing immediate rash,  facial/tongue/throat swelling, SOB or lightheadedness with hypotension: Unknown Has patient had a PCN reaction causing severe rash involving mucus membranes or skin necrosis: Unknown Has patient had a PCN reaction that required hospitalization: Unknown Has patient had a PCN reaction occurring within the last 10 years: No If all of the above answers are "NO", then may proceed with Cephalosporin use.    Prior to Admission medications   Medication Sig Start Date End Date Taking? Authorizing Provider  Albuterol Sulfate 108 (90 Base) MCG/ACT AEPB Inhale 2 puffs into the lungs every 4 (four) hours as needed (Shortness of breath).     [provider]  allopurinol (ZYLOPRIM) 100 MG tablet Take one tablet after hemodialysis, on Tuesday, Thursday and Saturday. Patient taking differently: Take 100 mg by mouth Every Tuesday,Thursday,and Saturday with dialysis. After dialysis 06/30/20   Arrien, Jimmy Picket, MD  ALPRAZolam Duanne Moron) 0.5 MG tablet Take 0.25-0.5 mg by mouth See admin instructions. Take 0.25 in the morning and evening and 0.5 mg at bedtime    [provider]  aspirin EC 81 MG tablet Take 81 mg by mouth daily.     [provider]  atorvastatin (LIPITOR) 80 MG tablet Take 1 tablet (80 mg total) by mouth every evening. Patient taking differently: Take 80 mg by mouth at bedtime. 12/19/19   Johnson, Clanford L, MD  clopidogrel (PLAVIX) 75 MG tablet Take 1 tablet (75 mg total) by mouth daily. 08/27/18   Satira Sark, MD  diclofenac Sodium (VOLTAREN) 1 % GEL Apply 2 g topically 4 (four) times daily as needed (apply to right foot big toe as needed for pain.). 06/30/20   Arrien, Jimmy Picket, MD  ferric citrate (AURYXIA) 1 GM 210 MG(Fe) tablet Take 420 mg by mouth 3 (three) times daily with meals.    [provider]  gabapentin (NEURONTIN) 400 MG capsule Take 1 capsule (400 mg total) by mouth 2 (two) times daily. 06/17/20   Barton Dubois, MD  ipratropium  (ATROVENT HFA) 17 MCG/ACT inhaler Inhale 2 puffs into the lungs every 4 (four) hours as needed for wheezing.     [provider]  levothyroxine (SYNTHROID) 112 MCG tablet Take 112 mcg by mouth daily before breakfast.    [provider]  nitroGLYCERIN (NITROSTAT) 0.4 MG SL tablet Place 0.4 mg under the tongue every 5 (five) minutes x 3 doses as needed for chest pain. Not to exceed 3 in 15 minute time frame    [provider]  NOVOLIN 70/30 FLEXPEN (70-30) 100 UNIT/ML KwikPen Inject 24 Units into the skin in the morning and at bedtime. 06/30/20   [provider]  ondansetron [facility])  8 MG tablet Take 8 mg by mouth every 8 (eight) hours as needed for nausea. 07/12/20   [provider]  pantoprazole (PROTONIX) 40 MG tablet Take 1 tablet (40 mg total) by mouth 2 (two) times daily. 06/17/20   Barton Dubois, MD  ranolazine (RANEXA) 500 MG 12 hr tablet Take 1 tablet (500 mg total) by mouth 2 (two) times daily. 07/09/20   Verta Ellen., NP  Vitamin D, Ergocalciferol, (DRISDOL) 1.25 MG (50000 UNIT) CAPS capsule Take 50,000 Units by mouth every Sunday.  12/31/19   [provider]   Current Facility-Administered Medications  Medication Dose Route Frequency Provider Last Rate Last Admin  . albuterol (VENTOLIN HFA) 108 (90 Base) MCG/ACT inhaler 2 puff  2 puff Inhalation Q4H PRN Adefeso, Oladapo, DO      . aspirin EC tablet 81 mg  81 mg Oral Daily Adefeso, Oladapo, DO      . atorvastatin (LIPITOR) tablet 80 mg  80 mg Oral QHS Adefeso, Oladapo, DO   80 mg at 01/09/21 2200  . Chlorhexidine Gluconate Cloth 2 % PADS 6 each  6 each Topical Daily Dahal, Binaya, MD      . clopidogrel (PLAVIX) tablet 75 mg  75 mg Oral Daily Dahal, Binaya, MD      . heparin ADULT infusion 100 units/mL (25000 units/264mL)  1,350 Units/hr Intravenous Continuous Dahal, Binaya, MD 13.5 mL/hr at 01/10/21 0747 1,350 Units/hr at 01/10/21 0747  . insulin aspart (novoLOG) injection 0-6  Units  0-6 Units Subcutaneous Q4H Adefeso, Oladapo, DO   1 Units at 01/09/21 2201  . ipratropium (ATROVENT) nebulizer solution 0.5 mg  0.5 mg Nebulization Q4H PRN Adefeso, Oladapo, DO      . levothyroxine (SYNTHROID) tablet 112 mcg  112 mcg Oral Q0600 Adefeso, Oladapo, DO      . nitroGLYCERIN 50 mg in dextrose 5 % 250 mL (0.2 mg/mL) infusion  5-200 mcg/min Intravenous Continuous Adefeso, Oladapo, DO 1.5 mL/hr at 01/09/21 1733 5 mcg/min at 01/09/21 1733  . pantoprazole (PROTONIX) EC tablet 40 mg  40 mg Oral BID Adefeso, Oladapo, DO   40 mg at 01/09/21 2200  . ranolazine (RANEXA) 12 hr tablet 500 mg  500 mg Oral Daily Renae Fickle, MD       Labs: Basic Metabolic Panel: Recent Labs  Lab 01/09/21 1715  NA 136  K 4.0  CL 96*  CO2 27  GLUCOSE 205*  BUN 39*  CREATININE 5.86*  CALCIUM 9.5   CBC: Recent Labs  Lab 01/09/21 1715  WBC 11.2*  HGB 12.5  HCT 38.9  MCV 102.9*  PLT 174   CBG: Recent Labs  Lab 01/09/21 2155 01/10/21 0041 01/10/21 0537 01/10/21 0819  GLUCAP 179* 219* 208* 211*   Studies/Results: DG Chest 2 View  Result Date: 01/09/2021 CLINICAL DATA:  Chest pain EXAM: CHEST - 2 VIEW COMPARISON:  October 07, 2020 FINDINGS: Right IJ central venous catheter with tip overlying the right atrium. Enlarged cardiac silhouette with central vascular prominence. Aortic atherosclerosis. Vascular stent and surgical clips overlie the left upper mediastinum. Prior median sternotomy and CABG. Mild diffuse interstitial thickening. No visible pleural effusion or pneumothorax IMPRESSION: Enlarged cardiac silhouette with central vascular prominence and probable mild interstitial edema. Electronically Signed   By: Dahlia Bailiff MD   On: 01/09/2021 17:02    Outpatient Dialysis Orders:  Center: [facility] on TTS. 180NRe BFR 400, DFR 500, EDW 90.5kg 4 hours, 2K/2.5Ca, heparin 2000 unit bolus Calcitriol 1 mcg PO q  HD auryxia 2 tabs PO TID with meals TDC-AVF failed, planning  to see Dr. Donzetta Matters later this week  Missed HD on 01/04/21  Assessment/Plan: 1.  Chest pain:  Pt with known CAD. Seen by cardiology and planned for left heart cath today.  2.  ESRD:  Dialyzes on TTS schedule 3.  Hypertension/volume: CXR showed mild interstitial edema. Patient reports her BP often drops on dialysis limiting UF. Not on any BP meds. Will follow LVEDP, lower machine temp to try to get more UF with HD 4.  Anemia: Hemoglobin 12.5. No ESA indicated at this time.  5.  Metabolic bone disease: Calcium controlled. Continue calcitriol and auryixa, follow phos.  6.  Nutrition:  Currently NPO for heart cath.  7. T2DM: on insulin per admitting team 8. Hypothyroidism: On synthroid  Anice Paganini, PA-C 01/10/2021, 8:31 AM  [facility] Pager: [phone]

## 2021-01-11 ENCOUNTER — Encounter (HOSPITAL_COMMUNITY): Payer: Self-pay | Admitting: Cardiology

## 2021-01-11 DIAGNOSIS — I214 Non-ST elevation (NSTEMI) myocardial infarction: Secondary | ICD-10-CM | POA: Diagnosis not present

## 2021-01-11 LAB — CBC
HCT: 33.1 % — ABNORMAL LOW (ref 36.0–46.0)
Hemoglobin: 10.9 g/dL — ABNORMAL LOW (ref 12.0–15.0)
MCH: 33.3 pg (ref 26.0–34.0)
MCHC: 32.9 g/dL (ref 30.0–36.0)
MCV: 101.2 fL — ABNORMAL HIGH (ref 80.0–100.0)
Platelets: 164 10*3/uL (ref 150–400)
RBC: 3.27 MIL/uL — ABNORMAL LOW (ref 3.87–5.11)
RDW: 13.4 % (ref 11.5–15.5)
WBC: 9.2 10*3/uL (ref 4.0–10.5)
nRBC: 0 % (ref 0.0–0.2)

## 2021-01-11 LAB — BASIC METABOLIC PANEL
Anion gap: 12 (ref 5–15)
BUN: 49 mg/dL — ABNORMAL HIGH (ref 8–23)
CO2: 24 mmol/L (ref 22–32)
Calcium: 8.7 mg/dL — ABNORMAL LOW (ref 8.9–10.3)
Chloride: 99 mmol/L (ref 98–111)
Creatinine, Ser: 6.91 mg/dL — ABNORMAL HIGH (ref 0.44–1.00)
GFR, Estimated: 6 mL/min — ABNORMAL LOW (ref >60)
Glucose, Bld: 91 mg/dL (ref 70–99)
Potassium: 4.8 mmol/L (ref 3.5–5.1)
Sodium: 135 mmol/L (ref 135–145)

## 2021-01-11 LAB — GLUCOSE, CAPILLARY
Glucose-Capillary: 156 mg/dL — ABNORMAL HIGH (ref 70–99)
Glucose-Capillary: 234 mg/dL — ABNORMAL HIGH (ref 70–99)
Glucose-Capillary: 97 mg/dL (ref 70–99)

## 2021-01-11 LAB — LIPID PANEL
Cholesterol: 127 mg/dL (ref 0–200)
HDL: 25 mg/dL — ABNORMAL LOW (ref >40)
LDL Cholesterol: 65 mg/dL (ref 0–99)
Total CHOL/HDL Ratio: 5.1 RATIO
Triglycerides: 186 mg/dL — ABNORMAL HIGH (ref <150)
VLDL: 37 mg/dL (ref 0–40)

## 2021-01-11 LAB — HEPATITIS B SURFACE ANTIGEN: Hepatitis B Surface Ag: NONREACTIVE

## 2021-01-11 LAB — HEPATITIS B SURFACE ANTIBODY,QUALITATIVE: Hep B S Ab: REACTIVE — AB

## 2021-01-11 NOTE — Progress Notes (Signed)
[id number] Gave pt MI booklet and discussed restrictions. Pt does not feel up to walking at this time. She stated she walks little since HD and keeping house clean is about all she can do. Reinforced plavix for stent. Reviewed NTG use. Since pt on HD, did not give diet information. Referred to dietitian at HD for diabetic and renal diet. Encouraged her to continue walking as tolerated. Referred to [place] CRP 2 to meet protocol. Pt stated not able to do since on HD and that usually wears her out. Graylon Good RN BSN 01/11/2021 1:39 PM

## 2021-01-11 NOTE — Discharge Summary (Signed)
Physician Discharge Summary  Donna Howe IRC:[id number] DOB: 1956/02/01 DOA: 01/09/2021  PCP: Curlene Labrum, MD  Admit date: 01/09/2021 Discharge date: 01/11/2021  Admitted From: Home Discharge disposition: Home   Code Status: Full Code  Diet Recommendation: Renal diet  Discharge Diagnosis:   Principal Problem:   Chest pain Active Problems:   Hyperlipidemia   Obesity (BMI 30-39.9)   CAD (coronary artery disease)   Hx of CABG   Acute coronary syndrome (HCC)   NSTEMI (non-ST elevated myocardial infarction) (HCC)   Hypothyroidism   Elevated troponin I level   Uncontrolled type 2 diabetes mellitus with hyperglycemia, with long-term current use of insulin (HCC)   GERD (gastroesophageal reflux disease)   ESRD on hemodialysis (HCC)   Leukocytosis   Elevated MCV   Diabetic neuropathy [facility])  Chief Complaint  Patient presents with  . Chest Pain   Brief narrative: Donna Howe is a 65 y.o. female with PMH significant for ESRD-HD, CAD s/p CABG in 2005 and multiple PCI's, DM2, HTN, HLD, PAD s/p stenting, deconditioning, obesity type 1, chronic anemia/thrombocytopnea. Patient presented to the ED at [facility] on 4/24 with c/o left-sided chest pain while she was doing grocery. Pain radiating to the back and left shoulder, improved with rest and nitroglycerin.  Chest pain later recurred at home with minimal task subsided again with 2 tablets of nitroglycerin.  Chest pain recurred for the third time and hence she called EMS and was brought to ED.   In the ED, patient was hemodynamically stable Troponin elevated to 80>141>209 EKG showed ST depressions in inferior and anterolateral leads. Subsequent EKG showed a 0.5 mm ST elevation in aVR and 2 mm ST segment depression in inferior and anterior leads V5 to V6. Cardiology was consulted. She got a dose of aspirin, and was started on heparin drip. Admitted to hospitalist service  Subjective: Patient was seen and examined this  morning postdialysis.  No new symptoms. Underwent cardiac cath with stenting yesterday.  Assessment/Plan: NSTEMI -Presented with chest pain, elevated troponin and EKG changes in a background of significant history of CAD. -4/25, underwent cardiac cath in PCI, found to have a culprit lesion in SVG to RCA 75% with successful PCI with a DES.  Also noted to have severe native CAD. Per cardiology, If the patient has recurrent anginal symptoms, it would be reasonable to pursue a Lexiscan Myoview stress test to evaluate for anterior wall ischemia.  If she requires future intervention on either the LIMA or subclavian artery, this would need to be performed by a vascular specialist.  -Patient to follow-up with cardiology as an outpatient. -Continue -Also on aspirin, Plavix, statin, Ranexa Recent Labs    01/10/21 0248 01/10/21 0948 01/10/21 1151  TROPONINIHS 333* 345* 391*   History of CAD s/p CABG and stent placement Hyperlipidemia PAD s/p stenting -04/09/2018 heart catheterization--ost LAD-prox LAD 80%, ost 1st margina 80%, mid RCA 90%, LIMA-LAD 70%; DES x 2 placed -Continue aspirin, Plavix and Lipitor  ESRD-HD-TTS -Last hemodialysis on Saturday 4/23. -Nephrology consulted for maintenance of dialysis  Essential hypertension -I do not see blood pressure meds in her home med list. -Blood pressure was elevated at presentation 206/88.  Started on nitroglycerin drip.  Blood pressure this morning postdialysis is running in low normal range.  Type 2 diabetes mellitus Hyperglycemia Diabetic neuropathy -Home meds include Novolin 70/30 twice a day 24 units  -Resume the same post discharge. -Continue Neurontin Recent Labs  Lab 01/10/21 1627 01/10/21 1935 01/10/21 2149 01/11/21  0010 01/11/21 1244  GLUCAP 127* 164* 173* 156* 97   Macrocytic anemia of chronic disease -Stable hemoglobin Recent Labs    06/14/20 0834 06/14/20 0841 06/16/20 0604 06/17/20 0551 10/07/20 0913 01/09/21 1715  01/10/21 0248 01/10/21 0801 01/11/21 0259  HGB  --    < > 8.9*   < > 14.0 12.5 10.7* 11.9* 10.9*  MCV  --    < > 95.0   < > 100.0 102.9* 101.8* 102.2* 101.2*  VITAMINB12  --   --  199  --   --   --  252  --   --   FOLATE  --   --  8.3  --   --   --  38.9  --   --   FERRITIN 4*  --   --   --  505*  --   --   --   --   TIBC 239*  --   --   --   --   --   --   --   --   IRON 146  --   --   --   --   --   --   --   --    < > = values in this interval not displayed.   Hypothyroidism -Continue Synthroid   Wound care: Incision (Closed) 11/26/19 Back Left (Active)  Date First Assessed/Time First Assessed: 11/26/19 1012   Location: Back  Location Orientation: Left    Assessments 11/26/2019 10:12 AM  Dressing Type Adhesive bandage  Dressing Dry;Intact;Clean  Dressing Change Frequency Daily  Site / Wound Assessment Dry;Clean  Drainage Amount None     No Linked orders to display     Incision (Closed) 06/29/20 Arm Left (Active)  Date First Assessed/Time First Assessed: 06/29/20 1118   Location: Arm  Location Orientation: Left    Assessments 06/29/2020 11:40 AM 06/30/2020  8:00 AM  Dressing Type Liquid skin adhesive None  Dressing Clean;Dry;Intact --  Site / Wound Assessment Clean;Dry Clean;Dry  Margins Attached edges (approximated) Attached edges (approximated)  Closure Skin glue Skin glue  Drainage Amount None None     No Linked orders to display     Incision (Closed) 09/15/20 Arm Left (Active)  Date First Assessed/Time First Assessed: 09/15/20 1315   Location: Arm  Location Orientation: Left    Assessments 09/15/2020  1:30 PM 09/15/2020  2:15 PM  Dressing Type Liquid skin adhesive Liquid skin adhesive  Site / Wound Assessment Dressing in place / Unable to assess Clean  Drainage Amount None None     No Linked orders to display    Discharge Exam:   Vitals:   01/11/21 1100 01/11/21 1108 01/11/21 1130 01/11/21 1225  BP: (!) 83/45 (!) 108/54 118/68 (!) 109/41  Pulse: 83 79  95   Resp:   18 18  Temp:   98.3 F (36.8 C)   TempSrc:   Oral Oral  SpO2:   99%   Weight:   92.5 kg   Height:        Body mass index is 33.93 kg/m.  General exam: Pleasant, middle-aged Caucasian female.  Looks older for age Skin: No rashes, lesions or ulcers. HEENT: Atraumatic, normocephalic, no obvious bleeding Lungs: Clear to auscultation bilaterally CVS: Regular rate and rhythm, no murmur GI/Abd soft, nontender, nondistended, bowel sound present CNS: Alert, awake, oriented x3 Psychiatry: Mood appropriate Extremities: No pedal edema, no calf tenderness  Follow ups:   Discharge Instructions    Amb  Referral to Cardiac Rehabilitation   Complete by: As directed    Diagnosis:  Coronary Stents NSTEMI     After initial evaluation and assessments completed: Virtual Based Care may be provided alone or in conjunction with Phase 2 Cardiac Rehab based on patient barriers.: Yes   Diet - low sodium heart healthy   Complete by: As directed    Diet Carb Modified   Complete by: As directed    Increase activity slowly   Complete by: As directed        Recommendations for Outpatient Follow-Up:   1. Follow-up with PCP as an outpatient  Discharge Instructions:  Follow with Primary MD Curlene Labrum, MD in 7 days   Get CBC/BMP checked in next visit within 1 week by PCP or SNF MD ( we routinely change or add medications that can affect your baseline labs and fluid status, therefore we recommend that you get the mentioned basic workup next visit with your PCP, your PCP may decide not to get them or add new tests based on their clinical decision)  On your next visit with your PCP, please Get Medicines reviewed and adjusted.  Please request your PCP  to go over all Hospital Tests and Procedure/Radiological results at the follow up, please get all Hospital records sent to your Prim MD by signing hospital release before you go home.  Activity: As tolerated with Full fall precautions  use walker/cane & assistance as needed  For Heart failure patients - Check your Weight same time everyday, if you gain over 2 pounds, or you develop in leg swelling, experience more shortness of breath or chest pain, call your Primary MD immediately. Follow Cardiac Low Salt Diet and 1.5 lit/day fluid restriction.  If you have smoked or chewed Tobacco in the last 2 yrs please stop smoking, stop any regular Alcohol  and or any Recreational drug use.  If you experience worsening of your admission symptoms, develop shortness of breath, life threatening emergency, suicidal or homicidal thoughts you must seek medical attention immediately by calling 911 or calling your MD immediately  if symptoms less severe.  You Must read complete instructions/literature along with all the possible adverse reactions/side effects for all the Medicines you take and that have been prescribed to you. Take any new Medicines after you have completely understood and accpet all the possible adverse reactions/side effects.   Do not drive, operate heavy machinery, perform activities at heights, swimming or participation in water activities or provide baby sitting services if your were admitted for syncope or siezures until you have seen by Primary MD or a Neurologist and advised to do so again.  Do not drive when taking Pain medications.  Do not take more than prescribed Pain, Sleep and Anxiety Medications  Wear Seat belts while driving.   Please note You were cared for by a hospitalist during your hospital stay. If you have any questions about your discharge medications or the care you received while you were in the hospital after you are discharged, you can call the unit and asked to speak with the hospitalist on call if the hospitalist that took care of you is not available. Once you are discharged, your primary care physician will handle any further medical issues. Please note that NO REFILLS for any discharge medications  will be authorized once you are discharged, as it is imperative that you return to your primary care physician (or establish a relationship with a primary care physician if you do  not have one) for your aftercare needs so that they can reassess your need for medications and monitor your lab values.    Allergies as of 01/11/2021      Reactions   Penicillins Other (See Comments)   REACTION: Unknown, told as a child Has patient had a PCN reaction causing immediate rash, facial/tongue/throat swelling, SOB or lightheadedness with hypotension: Unknown Has patient had a PCN reaction causing severe rash involving mucus membranes or skin necrosis: Unknown Has patient had a PCN reaction that required hospitalization: Unknown Has patient had a PCN reaction occurring within the last 10 years: No If all of the above answers are "NO", then may proceed with Cephalosporin use.      Medication List    TAKE these medications   Albuterol Sulfate 108 (90 Base) MCG/ACT Aepb Commonly known as: PROAIR RESPICLICK Inhale 2 puffs into the lungs every 4 (four) hours as needed (Shortness of breath).   allopurinol 100 MG tablet Commonly known as: Zyloprim Take one tablet after hemodialysis, on Tuesday, Thursday and Saturday. What changed:   how much to take  how to take this  when to take this  additional instructions   ALPRAZolam 0.5 MG tablet Commonly known as: XANAX Take 0.25-0.5 mg by mouth See admin instructions. Take 0.25 in the morning and evening and 0.5 mg at bedtime   aspirin EC 81 MG tablet Take 81 mg by mouth daily.   atorvastatin 80 MG tablet Commonly known as: LIPITOR Take 1 tablet (80 mg total) by mouth every evening. What changed: when to take this   clopidogrel 75 MG tablet Commonly known as: PLAVIX Take 1 tablet (75 mg total) by mouth daily.   diclofenac Sodium 1 % Gel Commonly known as: VOLTAREN Apply 2 g topically 4 (four) times daily as needed (apply to right foot big toe  as needed for pain.).   ferric citrate 1 GM 210 MG(Fe) tablet Commonly known as: AURYXIA Take 420 mg by mouth 3 (three) times daily with meals.   gabapentin 400 MG capsule Commonly known as: NEURONTIN Take 1 capsule (400 mg total) by mouth 2 (two) times daily.   ipratropium 17 MCG/ACT inhaler Commonly known as: ATROVENT HFA Inhale 2 puffs into the lungs every 4 (four) hours as needed for wheezing.   levothyroxine 112 MCG tablet Commonly known as: SYNTHROID Take 112 mcg by mouth daily before breakfast.   nitroGLYCERIN 0.4 MG SL tablet Commonly known as: NITROSTAT Place 0.4 mg under the tongue every 5 (five) minutes x 3 doses as needed for chest pain. Not to exceed 3 in 15 minute time frame   NovoLIN 70/30 FlexPen (70-30) 100 UNIT/ML KwikPen Generic drug: insulin isophane & regular human Inject 24 Units into the skin in the morning and at bedtime.   ondansetron 8 MG tablet Commonly known as: ZOFRAN Take 8 mg by mouth every 8 (eight) hours as needed for nausea.   pantoprazole 40 MG tablet Commonly known as: PROTONIX Take 1 tablet (40 mg total) by mouth 2 (two) times daily.   ranolazine 500 MG 12 hr tablet Commonly known as: RANEXA Take 1 tablet (500 mg total) by mouth 2 (two) times daily. What changed: when to take this   Vitamin D (Ergocalciferol) 1.25 MG (50000 UNIT) Caps capsule Commonly known as: DRISDOL Take 50,000 Units by mouth every Sunday.       Time coordinating discharge: 35 minutes  The results of significant diagnostics from this hospitalization (including imaging, microbiology, ancillary and laboratory) are  listed below for reference.    Procedures and Diagnostic Studies:   DG Chest 2 View  Result Date: 01/09/2021 CLINICAL DATA:  Chest pain EXAM: CHEST - 2 VIEW COMPARISON:  October 07, 2020 FINDINGS: Right IJ central venous catheter with tip overlying the right atrium. Enlarged cardiac silhouette with central vascular prominence. Aortic  atherosclerosis. Vascular stent and surgical clips overlie the left upper mediastinum. Prior median sternotomy and CABG. Mild diffuse interstitial thickening. No visible pleural effusion or pneumothorax IMPRESSION: Enlarged cardiac silhouette with central vascular prominence and probable mild interstitial edema. Electronically Signed   By: Dahlia Bailiff MD   On: 01/09/2021 17:02   CARDIAC CATHETERIZATION  Result Date: 01/10/2021  There is mild left ventricular systolic dysfunction. The left ventricular ejection fraction is 50-55% by visual estimate.  LV end diastolic pressure is moderately elevated.  There is no aortic valve stenosis.  -------------------------------------------  Ost LM to Dist LM STENT is 95% stenosed. Ost Cx to Mid Cx STENT is 99% stenosed.  JAILED 1st Mrg-1 lesion is 99% stenosed. 1st Mrg-2 lesion is 100% stenosed.  Ost LAD to Prox LAD lesion is 100% stenosed.  Prox RCA lesion is 100% stenosed.  LIMA graft was visualized by non-selective angiography and is normal in caliber. Origin to Prox Graft STENT is 60% stenosed.  SeqSVG OM-LPL graft was not visualized due to known occlusion. Origin to Prox Graft lesion before 1st Mrg is 100% stenosed.  SVG-mRCA graft was visualized by angiography. Origin STENT is 20% stenosed.  Prox SVG-RCA Graft lesion is 75% stenosed.  A drug-eluting stent was successfully placed (overlapping previous stent) using a SYNERGY XD [id number]. Postdilated to 2.75 mm.  Post intervention, there is a 0% residual stenosis.  Mid RCA to Dist RCA STENT (placed through SVG) is 30% stenosed.  SUMMARY  Likely culprit lesion-75% ulcerated lesion in SVG-RCA ->  s/p successful DES PCI overlapping ostial stent with Synergy DES 2.5 mg 60 mm postdilated to 2.75 mm.  Severe native CAD with occluded ostial LAD, 95% ostial OM1 with extensive stent ISR in the LM-LCx with least 95% stenosis RCA CTO.  Patent LIMA-LAD-but unable to engage the left subclavian artery due to stent  extending into the aorta.  Nonselective imaging reveals TIMI-3 flow in the LIMA graft, there does appear to be some in-stent restenosis at least 50% in the ostial stent.  Known occlusion of the SVG-OM-LPL,  Severe Systemic Hypertension with moderate to severely elevated LVEDP 22 mmHg RECOMMENDATION  Return to nursing for ongoing care.  Consider noninvasive imaging such as Myoview to evaluate for anterior ischemia, if warranted, would then probably require left brachial access to allow for LIMA angiography and possible PCI.  Needs aggressive respiratory medication with blood pressure control  Patient noted heartburn related chest discomfort along with nausea after being given additional 300 mg oral Plavix post PCI. [name], MD    Labs:   Basic Metabolic Panel: Recent Labs  Lab 01/09/21 1715 01/10/21 0248 01/11/21 0259  NA 136 137 135  K 4.0 3.8 4.8  CL 96* 98 99  CO2 27 27 24   GLUCOSE 205* 223* 91  BUN 39* 42* 49*  CREATININE 5.86* 6.14* 6.91*  CALCIUM 9.5 9.0 8.7*  MG  --  1.9  --   PHOS  --  5.6*  --    GFR Estimated Creatinine Clearance: 9.5 mL/min (A) (by C-G formula based on SCr of 6.91 mg/dL (H)). Liver Function Tests: Recent Labs  Lab 01/10/21 0248  AST 15  ALT 14  ALKPHOS 78  BILITOT 0.5  PROT 5.8*  ALBUMIN 2.8*   No results for input(s): LIPASE, AMYLASE in the last 168 hours. No results for input(s): AMMONIA in the last 168 hours. Coagulation profile Recent Labs  Lab 01/10/21 0248  INR 1.1    CBC: Recent Labs  Lab 01/09/21 1715 01/10/21 0248 01/10/21 0801 01/11/21 0259  WBC 11.2* 8.8 9.4 9.2  HGB 12.5 10.7* 11.9* 10.9*  HCT 38.9 33.1* 37.3 33.1*  MCV 102.9* 101.8* 102.2* 101.2*  PLT 174 139* 150 164   Cardiac Enzymes: No results for input(s): CKTOTAL, CKMB, CKMBINDEX, TROPONINI in the last 168 hours. BNP: Invalid input(s): POCBNP CBG: Recent Labs  Lab 01/10/21 1627 01/10/21 1935 01/10/21 2149 01/11/21 0010 01/11/21 1244  GLUCAP  127* 164* 173* 156* 97   D-Dimer No results for input(s): DDIMER in the last 72 hours. Hgb A1c Recent Labs    01/09/21 2059  HGBA1C 7.0*   Lipid Profile Recent Labs    01/11/21 0259  CHOL 127  HDL 25*  LDLCALC 65  TRIG 186*  CHOLHDL 5.1   Thyroid function studies No results for input(s): TSH, T4TOTAL, T3FREE, THYROIDAB in the last 72 hours.  Invalid input(s): FREET3 Anemia work up Recent Labs    01/10/21 0248  VITAMINB12 252  FOLATE 38.9   Microbiology Recent Results (from the past 240 hour(s))  Resp Panel by RT-PCR (Flu A&B, Covid) Nasopharyngeal Swab     Status: None   Collection Time: 01/09/21  5:30 PM   Specimen: Nasopharyngeal Swab; Nasopharyngeal(NP) swabs in vial transport medium  Result Value Ref Range Status   SARS Coronavirus 2 by RT PCR NEGATIVE NEGATIVE Final    Comment: (NOTE) SARS-CoV-2 target nucleic acids are NOT DETECTED.  The SARS-CoV-2 RNA is generally detectable in upper respiratory specimens during the acute phase of infection. The lowest concentration of SARS-CoV-2 viral copies this assay can detect is 138 copies/mL. A negative result does not preclude SARS-Cov-2 infection and should not be used as the sole basis for treatment or other patient management decisions. A negative result may occur with  improper specimen collection/handling, submission of specimen other than nasopharyngeal swab, presence of viral mutation(s) within the areas targeted by this assay, and inadequate number of viral copies(<138 copies/mL). A negative result must be combined with clinical observations, patient history, and epidemiological information. The expected result is Negative.  Fact Sheet for Patients:  EntrepreneurPulse.com.au  Fact Sheet for Healthcare Providers:  IncredibleEmployment.be  This test is no t yet approved or cleared by the [place] FDA and  has been authorized for detection and/or diagnosis of  SARS-CoV-2 by FDA under an Emergency Use Authorization (EUA). This EUA will remain  in effect (meaning this test can be used) for the duration of the COVID-19 declaration under Section 564(b)(1) of the Act, 21 U.S.C.section 360bbb-3(b)(1), unless the authorization is terminated  or revoked sooner.       Influenza A by PCR NEGATIVE NEGATIVE Final   Influenza B by PCR NEGATIVE NEGATIVE Final    Comment: (NOTE) The Xpert Xpress SARS-CoV-2/FLU/RSV plus assay is intended as an aid in the diagnosis of influenza from Nasopharyngeal swab specimens and should not be used as a sole basis for treatment. Nasal washings and aspirates are unacceptable for Xpert Xpress SARS-CoV-2/FLU/RSV testing.  Fact Sheet for Patients: EntrepreneurPulse.com.au  Fact Sheet for Healthcare Providers: IncredibleEmployment.be  This test is not yet approved or cleared by the [place] FDA and has been authorized for detection  and/or diagnosis of SARS-CoV-2 by FDA under an Emergency Use Authorization (EUA). This EUA will remain in effect (meaning this test can be used) for the duration of the COVID-19 declaration under Section 564(b)(1) of the Act, 21 U.S.C. section 360bbb-3(b)(1), unless the authorization is terminated or revoked.  Performed at [facility], [address], [place], [place] [address]   MRSA PCR Screening     Status: None   Collection Time: 01/10/21  2:20 AM   Specimen: Nasal Mucosa; Nasopharyngeal  Result Value Ref Range Status   MRSA by PCR NEGATIVE NEGATIVE Final    Comment:        The GeneXpert MRSA Assay (FDA approved for NASAL specimens only), is one component of a comprehensive MRSA colonization surveillance program. It is not intended to diagnose MRSA infection nor to guide or monitor treatment for MRSA infections. Performed at [facility], [place] [address], [place], [place] [address]      Signed: Terrilee Croak  [facility] 01/11/2021, 2:05 PM

## 2021-01-11 NOTE — Consult Note (Signed)
   [facility] Inpatient Consult   01/11/2021  Donna Howe 17-Dec-1955 [id number]  [place] [facility] [ACO] Patient: Donna Howe Medicare    Patient screened for hospitalization with noted extreme high risk score for unplanned readmission risk and to assess for potential [place] Management service needs for post hospital transition.  Review of patient's medical record reveals patient is home and has HD noted.    Plan:  Continue to follow progress and disposition to assess for post hospital care management needs.    For questions contact:   Natividad Brood, RN BSN [facility] Liaison  [phone] business mobile phone Toll free office [phone]  Fax number: [phone] [email or web address]

## 2021-01-11 NOTE — Plan of Care (Signed)
  Problem: Education: Goal: Knowledge of General Education information will improve Description: Including pain rating scale, medication(s)/side effects and non-pharmacologic comfort measures Outcome: Progressing   Problem: Health Behavior/Discharge Planning: Goal: Ability to manage health-related needs will improve Outcome: Progressing   Problem: Clinical Measurements: Goal: Diagnostic test results will improve Outcome: Progressing   Problem: Activity: Goal: Risk for activity intolerance will decrease Outcome: Progressing   Problem: Elimination: Goal: Will not experience complications related to urinary retention Outcome: Progressing   Problem: Pain Managment: Goal: General experience of comfort will improve Outcome: Progressing   Problem: Safety: Goal: Ability to remain free from injury will improve Outcome: Progressing   Problem: Skin Integrity: Goal: Risk for impaired skin integrity will decrease Outcome: Progressing

## 2021-01-11 NOTE — Progress Notes (Signed)
[facility] Progress Note   Subjective:   Feels fine this AM.  No chest pain, dyspnea.    Objective Vitals:   01/11/21 0725 01/11/21 0729 01/11/21 0800 01/11/21 0830  BP: (!) 145/61 (!) 147/64 (!) 156/81 (!) 118/57  Pulse: 76 76 77 80  Resp: 18 18 18    Temp: 98.3 F (36.8 C)     TempSrc: Oral     SpO2: 99%     Weight: 95.5 kg     Height:       Physical Exam General: nontoxic Heart: RRR Lungs: clear ant Abdomen: soft Extremities: no edema Dialysis Access: RIJ TDC c/d/i Qb 400 on HD  Additional Objective Labs: Basic Metabolic Panel: Recent Labs  Lab 01/09/21 1715 01/10/21 0248 01/11/21 0259  NA 136 137 135  K 4.0 3.8 4.8  CL 96* 98 99  CO2 27 27 24   GLUCOSE 205* 223* 91  BUN 39* 42* 49*  CREATININE 5.86* 6.14* 6.91*  CALCIUM 9.5 9.0 8.7*  PHOS  --  5.6*  --    Liver Function Tests: Recent Labs  Lab 01/10/21 0248  AST 15  ALT 14  ALKPHOS 78  BILITOT 0.5  PROT 5.8*  ALBUMIN 2.8*   No results for input(s): LIPASE, AMYLASE in the last 168 hours. CBC: Recent Labs  Lab 01/09/21 1715 01/10/21 0248 01/10/21 0801 01/11/21 0259  WBC 11.2* 8.8 9.4 9.2  HGB 12.5 10.7* 11.9* 10.9*  HCT 38.9 33.1* 37.3 33.1*  MCV 102.9* 101.8* 102.2* 101.2*  PLT 174 139* 150 164   Blood Culture    Component Value Date/Time   SDES BLOOD RIGHT HAND 10/07/2020 0918   SPECREQUEST  10/07/2020 0918    BOTTLES DRAWN AEROBIC AND ANAEROBIC Blood Culture adequate volume   CULT  10/07/2020 0918    NO GROWTH 5 DAYS Performed at [facility], [address] [place], [place] [address]    REPTSTATUS 10/12/2020 FINAL 10/07/2020 3151    Cardiac Enzymes: No results for input(s): CKTOTAL, CKMB, CKMBINDEX, TROPONINI in the last 168 hours. CBG: Recent Labs  Lab 01/10/21 1247 01/10/21 1627 01/10/21 1935 01/10/21 2149 01/11/21 0010  GLUCAP 156* 127* 164* 173* 156*   Iron Studies: No results for input(s): IRON, TIBC, TRANSFERRIN, FERRITIN in the last 72  hours. @lablastinr3 @ Studies/Results: DG Chest 2 View  Result Date: 01/09/2021 CLINICAL DATA:  Chest pain EXAM: CHEST - 2 VIEW COMPARISON:  October 07, 2020 FINDINGS: Right IJ central venous catheter with tip overlying the right atrium. Enlarged cardiac silhouette with central vascular prominence. Aortic atherosclerosis. Vascular stent and surgical clips overlie the left upper mediastinum. Prior median sternotomy and CABG. Mild diffuse interstitial thickening. No visible pleural effusion or pneumothorax IMPRESSION: Enlarged cardiac silhouette with central vascular prominence and probable mild interstitial edema. Electronically Signed   By: Dahlia Bailiff MD   On: 01/09/2021 17:02   CARDIAC CATHETERIZATION  Result Date: 01/10/2021  There is mild left ventricular systolic dysfunction. The left ventricular ejection fraction is 50-55% by visual estimate.  LV end diastolic pressure is moderately elevated.  There is no aortic valve stenosis.  -------------------------------------------  Ost LM to Dist LM STENT is 95% stenosed. Ost Cx to Mid Cx STENT is 99% stenosed.  JAILED 1st Mrg-1 lesion is 99% stenosed. 1st Mrg-2 lesion is 100% stenosed.  Ost LAD to Prox LAD lesion is 100% stenosed.  Prox RCA lesion is 100% stenosed.  LIMA graft was visualized by non-selective angiography and is normal in caliber. Origin to Prox Graft  STENT is 60% stenosed.  SeqSVG OM-LPL graft was not visualized due to known occlusion. Origin to Prox Graft lesion before 1st Mrg is 100% stenosed.  SVG-mRCA graft was visualized by angiography. Origin STENT is 20% stenosed.  Prox SVG-RCA Graft lesion is 75% stenosed.  A drug-eluting stent was successfully placed (overlapping previous stent) using a SYNERGY XD 2.50X16. Postdilated to 2.75 mm.  Post intervention, there is a 0% residual stenosis.  Mid RCA to Dist RCA STENT (placed through SVG) is 30% stenosed.  SUMMARY  Likely culprit lesion-75% ulcerated lesion in SVG-RCA ->  s/p  successful DES PCI overlapping ostial stent with Synergy DES 2.5 mg 60 mm postdilated to 2.75 mm.  Severe native CAD with occluded ostial LAD, 95% ostial OM1 with extensive stent ISR in the LM-LCx with least 95% stenosis RCA CTO.  Patent LIMA-LAD-but unable to engage the left subclavian artery due to stent extending into the aorta.  Nonselective imaging reveals TIMI-3 flow in the LIMA graft, there does appear to be some in-stent restenosis at least 50% in the ostial stent.  Known occlusion of the SVG-OM-LPL,  Severe Systemic Hypertension with moderate to severely elevated LVEDP 22 mmHg RECOMMENDATION  Return to nursing for ongoing care.  Consider noninvasive imaging such as Myoview to evaluate for anterior ischemia, if warranted, would then probably require left brachial access to allow for LIMA angiography and possible PCI.  Needs aggressive respiratory medication with blood pressure control  Patient noted heartburn related chest discomfort along with nausea after being given additional 300 mg oral Plavix post PCI. [name], MD  Medications: . sodium chloride 10 mL/hr at 01/10/21 2359  . nitroGLYCERIN 5 mcg/min (01/10/21 2359)   . aspirin EC  81 mg Oral Daily  . atorvastatin  80 mg Oral QHS  . Chlorhexidine Gluconate Cloth  6 each Topical Daily  . Chlorhexidine Gluconate Cloth  6 each Topical Q0600  . clopidogrel  75 mg Oral Daily  . gabapentin  400 mg Oral BID  . heparin  5,000 Units Subcutaneous Q8H  . insulin aspart protamine- aspart  15 Units Subcutaneous BID WC  . levothyroxine  112 mcg Oral Q0600  . pantoprazole  40 mg Oral BID  . ranolazine  500 mg Oral Daily  . sodium chloride flush  3 mL Intravenous Q12H     Outpatient Dialysis Orders:  Center: [facility] on TTS. 180NRe BFR 400, DFR 500, EDW 90.5kg 4 hours, 2K/2.5Ca, heparin 2000 unit bolus Calcitriol 1 mcg PO q HD auryxia 2 tabs PO TID with meals TDC-AVF failed, planning to see Dr. Donzetta Matters later this  week  Missed HD on 01/04/21  Assessment/Plan: 1.  CAD h/o CABG + PCI now with NSTEMI: LHC yesterday with PCI to SVG to RCA.  Chest pain free currently.  On ASA, plavix, statin, ranexa.  2.  ESRD:  Dialyzes on TTS schedule - HD today per schedule.   3.  Hypertension/volume: CXR showed mild interstitial edema. Patient reports her BP often drops on dialysis limiting UF. Lower machine temp to try to get more UF with HD, challenging EDW.   4.  Anemia: Hemoglobin 10.9.. No ESA indicated at this time.  5.  Metabolic bone disease: Calcium controlled. Continue calcitriol and auryixa, follow phos.  6.  Nutrition:  renal diet 7. T2DM: on insulin per admitting team 8. Hypothyroidism: On synthroid   Jannifer Hick MD 01/11/2021, 8:49 AM  [facility] Pager: [phone]

## 2021-01-12 ENCOUNTER — Telehealth: Payer: Self-pay | Admitting: Physician Assistant

## 2021-01-12 DIAGNOSIS — I214 Non-ST elevation (NSTEMI) myocardial infarction: Secondary | ICD-10-CM

## 2021-01-12 LAB — HEPATITIS B SURFACE ANTIBODY, QUANTITATIVE: Hep B S AB Quant (Post): 11.3 m[IU]/mL (ref >9.9)

## 2021-01-12 NOTE — Telephone Encounter (Signed)
Transition of care contact from inpatient facility  Date of Discharge: 01/11/21 Date of Contact: 01/12/21 Method of contact: Phone  Attempted to contact patient to discuss transition of care from inpatient admission. Call would not go through, attempted on two separate phones. Will try again tomorrow.  Anice Paganini, PA-C 01/12/2021, 12:34 PM  [facility]

## 2021-01-13 DIAGNOSIS — N2581 Secondary hyperparathyroidism of renal origin: Secondary | ICD-10-CM | POA: Diagnosis not present

## 2021-01-13 DIAGNOSIS — R079 Chest pain, unspecified: Secondary | ICD-10-CM | POA: Diagnosis not present

## 2021-01-13 DIAGNOSIS — N186 End stage renal disease: Secondary | ICD-10-CM | POA: Diagnosis not present

## 2021-01-13 DIAGNOSIS — Z992 Dependence on renal dialysis: Secondary | ICD-10-CM | POA: Diagnosis not present

## 2021-01-13 DIAGNOSIS — D689 Coagulation defect, unspecified: Secondary | ICD-10-CM | POA: Diagnosis not present

## 2021-01-15 DIAGNOSIS — N2581 Secondary hyperparathyroidism of renal origin: Secondary | ICD-10-CM | POA: Diagnosis not present

## 2021-01-15 DIAGNOSIS — I70201 Unspecified atherosclerosis of native arteries of extremities, right leg: Secondary | ICD-10-CM | POA: Diagnosis not present

## 2021-01-15 DIAGNOSIS — E1122 Type 2 diabetes mellitus with diabetic chronic kidney disease: Secondary | ICD-10-CM | POA: Diagnosis not present

## 2021-01-15 DIAGNOSIS — E1165 Type 2 diabetes mellitus with hyperglycemia: Secondary | ICD-10-CM | POA: Diagnosis not present

## 2021-01-15 DIAGNOSIS — Z992 Dependence on renal dialysis: Secondary | ICD-10-CM | POA: Diagnosis not present

## 2021-01-15 DIAGNOSIS — Z87891 Personal history of nicotine dependence: Secondary | ICD-10-CM | POA: Diagnosis not present

## 2021-01-15 DIAGNOSIS — D689 Coagulation defect, unspecified: Secondary | ICD-10-CM | POA: Diagnosis not present

## 2021-01-15 DIAGNOSIS — N186 End stage renal disease: Secondary | ICD-10-CM | POA: Diagnosis not present

## 2021-01-15 DIAGNOSIS — Z794 Long term (current) use of insulin: Secondary | ICD-10-CM | POA: Diagnosis not present

## 2021-01-15 DIAGNOSIS — I10 Essential (primary) hypertension: Secondary | ICD-10-CM | POA: Diagnosis not present

## 2021-01-18 DIAGNOSIS — Z992 Dependence on renal dialysis: Secondary | ICD-10-CM | POA: Diagnosis not present

## 2021-01-18 DIAGNOSIS — N2581 Secondary hyperparathyroidism of renal origin: Secondary | ICD-10-CM | POA: Diagnosis not present

## 2021-01-18 DIAGNOSIS — N186 End stage renal disease: Secondary | ICD-10-CM | POA: Diagnosis not present

## 2021-01-18 DIAGNOSIS — D689 Coagulation defect, unspecified: Secondary | ICD-10-CM | POA: Diagnosis not present

## 2021-01-19 DIAGNOSIS — I214 Non-ST elevation (NSTEMI) myocardial infarction: Secondary | ICD-10-CM | POA: Diagnosis not present

## 2021-01-19 DIAGNOSIS — N186 End stage renal disease: Secondary | ICD-10-CM | POA: Diagnosis not present

## 2021-01-19 DIAGNOSIS — Z992 Dependence on renal dialysis: Secondary | ICD-10-CM | POA: Diagnosis not present

## 2021-01-19 DIAGNOSIS — I739 Peripheral vascular disease, unspecified: Secondary | ICD-10-CM | POA: Diagnosis not present

## 2021-01-19 DIAGNOSIS — I10 Essential (primary) hypertension: Secondary | ICD-10-CM | POA: Diagnosis not present

## 2021-01-19 DIAGNOSIS — E039 Hypothyroidism, unspecified: Secondary | ICD-10-CM | POA: Diagnosis not present

## 2021-01-19 DIAGNOSIS — J449 Chronic obstructive pulmonary disease, unspecified: Secondary | ICD-10-CM | POA: Diagnosis not present

## 2021-01-19 DIAGNOSIS — E1140 Type 2 diabetes mellitus with diabetic neuropathy, unspecified: Secondary | ICD-10-CM | POA: Diagnosis not present

## 2021-01-19 DIAGNOSIS — M1000 Idiopathic gout, unspecified site: Secondary | ICD-10-CM | POA: Diagnosis not present

## 2021-01-19 DIAGNOSIS — Z8679 Personal history of other diseases of the circulatory system: Secondary | ICD-10-CM | POA: Diagnosis not present

## 2021-01-19 DIAGNOSIS — N189 Chronic kidney disease, unspecified: Secondary | ICD-10-CM | POA: Diagnosis not present

## 2021-01-19 DIAGNOSIS — E7849 Other hyperlipidemia: Secondary | ICD-10-CM | POA: Diagnosis not present

## 2021-01-20 DIAGNOSIS — D689 Coagulation defect, unspecified: Secondary | ICD-10-CM | POA: Diagnosis not present

## 2021-01-20 DIAGNOSIS — N186 End stage renal disease: Secondary | ICD-10-CM | POA: Diagnosis not present

## 2021-01-20 DIAGNOSIS — N2581 Secondary hyperparathyroidism of renal origin: Secondary | ICD-10-CM | POA: Diagnosis not present

## 2021-01-20 DIAGNOSIS — Z992 Dependence on renal dialysis: Secondary | ICD-10-CM | POA: Diagnosis not present

## 2021-01-21 ENCOUNTER — Ambulatory Visit: Payer: Medicare HMO | Admitting: Vascular Surgery

## 2021-01-22 DIAGNOSIS — D689 Coagulation defect, unspecified: Secondary | ICD-10-CM | POA: Diagnosis not present

## 2021-01-22 DIAGNOSIS — N186 End stage renal disease: Secondary | ICD-10-CM | POA: Diagnosis not present

## 2021-01-22 DIAGNOSIS — N2581 Secondary hyperparathyroidism of renal origin: Secondary | ICD-10-CM | POA: Diagnosis not present

## 2021-01-22 DIAGNOSIS — Z992 Dependence on renal dialysis: Secondary | ICD-10-CM | POA: Diagnosis not present

## 2021-01-25 DIAGNOSIS — N2581 Secondary hyperparathyroidism of renal origin: Secondary | ICD-10-CM | POA: Diagnosis not present

## 2021-01-25 DIAGNOSIS — N186 End stage renal disease: Secondary | ICD-10-CM | POA: Diagnosis not present

## 2021-01-25 DIAGNOSIS — Z992 Dependence on renal dialysis: Secondary | ICD-10-CM | POA: Diagnosis not present

## 2021-01-25 DIAGNOSIS — D689 Coagulation defect, unspecified: Secondary | ICD-10-CM | POA: Diagnosis not present

## 2021-01-27 DIAGNOSIS — Z992 Dependence on renal dialysis: Secondary | ICD-10-CM | POA: Diagnosis not present

## 2021-01-27 DIAGNOSIS — D689 Coagulation defect, unspecified: Secondary | ICD-10-CM | POA: Diagnosis not present

## 2021-01-27 DIAGNOSIS — N186 End stage renal disease: Secondary | ICD-10-CM | POA: Diagnosis not present

## 2021-01-27 DIAGNOSIS — N2581 Secondary hyperparathyroidism of renal origin: Secondary | ICD-10-CM | POA: Diagnosis not present

## 2021-01-28 ENCOUNTER — Ambulatory Visit: Payer: Medicare HMO | Admitting: Student

## 2021-01-28 ENCOUNTER — Encounter: Payer: Self-pay | Admitting: Student

## 2021-01-28 ENCOUNTER — Other Ambulatory Visit: Payer: Self-pay

## 2021-01-28 VITALS — BP 142/48 | HR 88 | Ht 65.0 in | Wt 207.2 lb

## 2021-01-28 DIAGNOSIS — I6523 Occlusion and stenosis of bilateral carotid arteries: Secondary | ICD-10-CM

## 2021-01-28 DIAGNOSIS — I10 Essential (primary) hypertension: Secondary | ICD-10-CM | POA: Diagnosis not present

## 2021-01-28 DIAGNOSIS — I25118 Atherosclerotic heart disease of native coronary artery with other forms of angina pectoris: Secondary | ICD-10-CM | POA: Diagnosis not present

## 2021-01-28 DIAGNOSIS — Z992 Dependence on renal dialysis: Secondary | ICD-10-CM

## 2021-01-28 DIAGNOSIS — E785 Hyperlipidemia, unspecified: Secondary | ICD-10-CM

## 2021-01-28 DIAGNOSIS — N186 End stage renal disease: Secondary | ICD-10-CM | POA: Diagnosis not present

## 2021-01-28 NOTE — Progress Notes (Signed)
Cardiology Office Note    Date:  01/29/2021   ID:  ZAMYIAH TINO, DOB 1956-08-14, MRN [id number]  PCP:  Curlene Labrum, MD  Cardiologist: Rozann Lesches, MD    Chief Complaint  Patient presents with  . Hospitalization Follow-up    History of Present Illness:    Donna Howe is a 65 y.o. female with past medical history of CAD (s/pCABG in 2005 with multiple PCI's since includingPTCA to SVG-OM1 in 01/2004,PTCA/DES to LM and prox LCxin 03/2004 and DES to SVG-OM1, PTCA/DES to SVG-OMin 01/2011,PTCA/DES to LM and LCxin 2015, cutting balloon angioplasty to prox Cx and DES to SVG-RCAin 05/2017, NSTEMI in 03/2018 with DES to mid-RCA and DES to LIMA-LAD, NSTEMI in 06/2020 and medical management recommended as she was felt to be high-risk for re-do CABG and poor target vessels), HTN, HLD, Type 2 DM, carotid artery stenosis (s/p R CEA in 2014) and ESRD who presents to the office today for hospital follow-up.   She most recently presented to [facility] ED on 01/09/2021 for evaluation of chest pain which had started that morning while shopping. Was found to have an NSTEMI with Hs Troponin peaking at 391. It was recommended to proceed with a repeat cardiac catheterization to see if she had any PCI targets given that she was previously felt to be a poor candidate for redo CABG. She was found to have 75% stenosis along the SVG to RCA which was felt to be the culprit and this was treated with DES placement x2. Was also noted to have severe native CAD with occluded ostial LAD, 95% ostial OM1 with extensive stent ISR in the LM-LCx with least 95% stenosis RCA CTO, patent LIMA-LAD but unable to engage the left subclavian artery due to stent extending into the aorta but some in-stent restenosis at least 50% in the ostial stent. It was recommended that if she had recurrent anginal symptoms, would pursue a Lexiscan Myoview stress test to evaluate for anterior wall ischemia. She was discharged home the  day following stent placement.  In talking with the patient today, she reports occasional episodes of chest pain since returning home but feels they are most consistent with acid reflux as symptoms resolve with Gas-X or consuming soda. No symptoms as severe as when she came to the hospital last month. Breathing has been stable. No orthopnea, PND or pitting edema. Reports episodes of hypotension during HD and her sessions have to be paused.      Past Medical History:  Diagnosis Date  . Anemia   . Anxiety   . Asthma   . CAD (coronary artery disease)    Multivessel s/p CABG 2005, numerous PCIs since that time and documented graft disease  . Carotid artery disease (HCC)    R CEA  . ESRD on hemodialysis ([place])   . Essential hypertension   . Gout   . History of blood transfusion   . Hyperlipidemia   . Hypothyroidism   . Myocardial infarction ([place])   . PAD (peripheral artery disease) ([place])    Dr. Kellie Simmering  . Pneumonia 09/2019, 11/2019  . S/P angioplasty with stent- DES to [facility] and to LIMA to LAD with DES 04/09/18.   04/10/2018  . SBO (small bowel obstruction) ([place]) 2011   Status post lysis of adhesions & hernia repair  . Sinus bradycardia   . Type 2 diabetes mellitus ([place])   . Umbilical hernia     Past Surgical History:  Procedure Laterality Date  .  AORTIC ARCH ANGIOGRAPHY N/A 08/02/2020   Procedure: AORTIC ARCH ANGIOGRAPHY;  Surgeon: Waynetta Sandy, MD;  Location: [place] [facility];  Service: Cardiovascular;  Laterality: N/A;  Lt upper extermity  . AV FISTULA PLACEMENT Left 06/29/2020   Procedure: LEFT ARM ARTERIOVENOUS (AV) FISTULA;  Surgeon: Waynetta Sandy, MD;  Location: [place];  Service: Vascular;  Laterality: Left;  ARM  . CESAREAN SECTION  1984  . CHOLECYSTECTOMY  2010  . CORONARY ARTERY BYPASS GRAFT  2005  . CORONARY BALLOON ANGIOPLASTY N/A 05/31/2017   Procedure: CORONARY BALLOON ANGIOPLASTY;  Surgeon: Jettie Booze, MD;  Location: [place] [facility]  [facility];  Service: Cardiovascular;  Laterality: N/A;  . CORONARY STENT INTERVENTION N/A 05/31/2017   Procedure: CORONARY STENT INTERVENTION;  Surgeon: Jettie Booze, MD;  Location: [place] [facility];  Service: Cardiovascular;  Laterality: N/A;  . CORONARY STENT INTERVENTION N/A 04/09/2018   Procedure: CORONARY STENT INTERVENTION;  Surgeon: Jettie Booze, MD;  Location: [place] [facility];  Service: Cardiovascular;  Laterality: N/A;  SVG RCA  . CORONARY STENT INTERVENTION N/A 01/10/2021   Procedure: CORONARY STENT INTERVENTION;  Surgeon: Leonie Man, MD;  Location: [place] [facility];  Service: Cardiovascular;  Laterality: N/A;  . ENDARTERECTOMY Right 04/18/2013   Procedure: ENDARTERECTOMY CAROTID;  Surgeon: Mal Misty, MD;  Location: [place];  Service: Vascular;  Laterality: Right;  . HERNIA REPAIR  1989  . Incisional hernia repair x2  03/04/2010   Laparoscopic with 35cm mesh by Dr Ronnald Collum  . IR FLUORO GUIDE CV LINE RIGHT  06/21/2020  . IR US GUIDE VASC ACCESS RIGHT  06/21/2020  . LEFT HEART CATH AND CORS/GRAFTS ANGIOGRAPHY N/A 05/31/2017   Procedure: LEFT HEART CATH AND CORS/GRAFTS ANGIOGRAPHY;  Surgeon: Jettie Booze, MD;  Location: [place] [facility];  Service: Cardiovascular;  Laterality: N/A;  . LEFT HEART CATH AND CORS/GRAFTS ANGIOGRAPHY N/A 04/08/2018   Procedure: LEFT HEART CATH AND CORS/GRAFTS ANGIOGRAPHY;  Surgeon: Jettie Booze, MD;  Location: [place] [facility];  Service: Cardiovascular;  Laterality: N/A;  . LEFT HEART CATH AND CORS/GRAFTS ANGIOGRAPHY N/A 06/22/2020   Procedure: LEFT HEART CATH AND CORS/GRAFTS ANGIOGRAPHY;  Surgeon: Belva Crome, MD;  Location: [place] [facility];  Service: Cardiovascular;  Laterality: N/A;  . LEFT HEART CATH AND CORS/GRAFTS ANGIOGRAPHY N/A 01/10/2021   Procedure: LEFT HEART CATH AND CORS/GRAFTS ANGIOGRAPHY;  Surgeon: Leonie Man, MD;  Location: [place] [facility];  Service: Cardiovascular;  Laterality: N/A;  . LEFT  HEART CATHETERIZATION WITH CORONARY ANGIOGRAM N/A 12/19/2012   Procedure: LEFT HEART CATHETERIZATION WITH CORONARY ANGIOGRAM;  Surgeon: Josue Hector, MD;  Location: [facility] CATH LAB;  Service: Cardiovascular;  Laterality: N/A;  . LEFT HEART CATHETERIZATION WITH CORONARY/GRAFT ANGIOGRAM N/A 04/19/2013   Procedure: LEFT HEART CATHETERIZATION WITH Beatrix Fetters;  Surgeon: Lorretta Harp, MD;  Location: [facility] CATH LAB;  Service: Cardiovascular;  Laterality: N/A;  . LIGATION OF ARTERIOVENOUS  FISTULA Left 09/15/2020   Procedure: LIGATION OF LEFT ARM ARTERIOVENOUS  FISTULA;  Surgeon: Waynetta Sandy, MD;  Location: [place];  Service: Vascular;  Laterality: Left;  . PATCH ANGIOPLASTY Right 04/18/2013   Procedure: PATCH ANGIOPLASTY Right Internal Carotid Artery;  Surgeon: Mal Misty, MD;  Location: [place];  Service: Vascular;  Laterality: Right;  . PERCUTANEOUS CORONARY STENT INTERVENTION (PCI-S) Right 12/19/2012   Procedure: PERCUTANEOUS CORONARY STENT INTERVENTION (PCI-S);  Surgeon: Josue Hector, MD;  Location: [facility] CATH LAB;  Service:  Cardiovascular;  Laterality: Right;  . PERIPHERAL VASCULAR INTERVENTION Left 08/02/2020   Procedure: PERIPHERAL VASCULAR INTERVENTION;  Surgeon: Waynetta Sandy, MD;  Location: [place] [facility] LAB;  Service: Cardiovascular;  Laterality: Left;  Left subclavian  . SHOULDER SURGERY      Current Medications: Outpatient Medications Prior to Visit  Medication Sig Dispense Refill  . Albuterol Sulfate 108 (90 Base) MCG/ACT AEPB Inhale 2 puffs into the lungs every 4 (four) hours as needed (Shortness of breath).     Marland Kitchen allopurinol (ZYLOPRIM) 100 MG tablet Take one tablet after hemodialysis, on Tuesday, Thursday and Saturday. (Patient taking differently: Take 100 mg by mouth Every Tuesday,Thursday,and Saturday with dialysis. After dialysis) 30 tablet 11  . ALPRAZolam (XANAX) 0.5 MG tablet Take 0.25-0.5 mg by mouth See admin instructions. Take 0.25 in the morning  and evening and 0.5 mg at bedtime    . aspirin EC 81 MG tablet Take 81 mg by mouth daily.     Marland Kitchen atorvastatin (LIPITOR) 80 MG tablet Take 1 tablet (80 mg total) by mouth every evening. (Patient taking differently: Take 80 mg by mouth at bedtime.)    . clopidogrel (PLAVIX) 75 MG tablet Take 1 tablet (75 mg total) by mouth daily. 90 tablet 3  . diclofenac Sodium (VOLTAREN) 1 % GEL Apply 2 g topically 4 (four) times daily as needed (apply to right foot big toe as needed for pain.). 50 g 0  . ferric citrate (AURYXIA) 1 GM 210 MG(Fe) tablet Take 420 mg by mouth 3 (three) times daily with meals.    . gabapentin (NEURONTIN) 400 MG capsule Take 1 capsule (400 mg total) by mouth 2 (two) times daily.    Marland Kitchen ipratropium (ATROVENT HFA) 17 MCG/ACT inhaler Inhale 2 puffs into the lungs every 4 (four) hours as needed for wheezing.     Marland Kitchen levothyroxine (SYNTHROID) 112 MCG tablet Take 112 mcg by mouth daily before breakfast.    . nitroGLYCERIN (NITROSTAT) 0.4 MG SL tablet Place 0.4 mg under the tongue every 5 (five) minutes x 3 doses as needed for chest pain. Not to exceed 3 in 15 minute time frame    . NOVOLIN 70/30 FLEXPEN (70-30) 100 UNIT/ML KwikPen Inject 24 Units into the skin in the morning and at bedtime.    . ondansetron (ZOFRAN) 8 MG tablet Take 8 mg by mouth every 8 (eight) hours as needed for nausea.    . pantoprazole (PROTONIX) 40 MG tablet Take 1 tablet (40 mg total) by mouth 2 (two) times daily. 60 tablet 1  . ranolazine (RANEXA) 500 MG 12 hr tablet Take 1 tablet (500 mg total) by mouth 2 (two) times daily. (Patient taking differently: Take 500 mg by mouth at bedtime.) 60 tablet 1  . Vitamin D, Ergocalciferol, (DRISDOL) 1.25 MG (50000 UNIT) CAPS capsule Take 50,000 Units by mouth every Sunday.      No facility-administered medications prior to visit.     Allergies:   Penicillins and Beta adrenergic blockers   Social History   Socioeconomic History  . Marital status: Divorced    Spouse name: Not on  file  . Number of children: Not on file  . Years of education: Not on file  . Highest education level: Not on file  Occupational History  . Occupation: Disabled  Tobacco Use  . Smoking status: Former Smoker    Packs/day: 1.00    Years: 20.00    Pack years: 20.00    Types: Cigarettes    Quit date:  12/10/2012    Years since quitting: 8.1  . Smokeless tobacco: Never Used  Vaping Use  . Vaping Use: Never used  Substance and Sexual Activity  . Alcohol use: No    Alcohol/week: 0.0 standard drinks  . Drug use: No  . Sexual activity: Not Currently  Other Topics Concern  . Not on file  Social History Narrative   Lives with mother.   Social Determinants of Health   Financial Resource Strain: Not on file  Food Insecurity: Not on file  Transportation Needs: Not on file  Physical Activity: Not on file  Stress: Not on file  Social Connections: Not on file     Family History:  The patient's family history includes AAA (abdominal aortic aneurysm) in her father; Diabetes in her mother; Heart disease in her brother and mother; Hyperlipidemia in her mother and son; Hypertension in her brother, father, mother, and son; Thyroid disease in her father.   Review of Systems:    Please see the history of present illness.     All other systems reviewed and are otherwise negative except as noted above.   Physical Exam:    VS:  BP (!) 142/48   Pulse 88   Ht 5\' 5"  (1.651 m)   Wt 207 lb 3.2 oz (94 kg)   SpO2 100%   BMI 34.48 kg/m    General: Well developed, well nourished,female appearing in no acute distress. Head: Normocephalic, atraumatic. Neck: No carotid bruits. JVD not elevated.  Lungs: Respirations regular and unlabored, without wheezes or rales.  Heart: Regular rate and rhythm with frequent ectopic beats. No S3 or S4.  No murmur, no rubs, or gallops appreciated. Abdomen: Appears non-distended. No obvious abdominal masses. Msk:  Strength and tone appear normal for age. No obvious  joint deformities or effusions. Extremities: No clubbing or cyanosis. No pitting edema.  Distal pedal pulses are 2+ bilaterally. Neuro: Alert and oriented X 3. Moves all extremities spontaneously. No focal deficits noted. Psych:  Responds to questions appropriately with a normal affect. Skin: No rashes or lesions noted  Wt Readings from Last 3 Encounters:  01/28/21 207 lb 3.2 oz (94 kg)  01/11/21 203 lb 14.8 oz (92.5 kg)  01/05/21 210 lb (95.3 kg)     Studies/Labs Reviewed:   EKG:  EKG is ordered today. The EKG ordered today demonstrates underlying NSR with ventricular bigeminy (noted on prior tracings as well) and ST depression along the inferior and lateral leads.   Recent Labs: 06/18/2020: B Natriuretic Peptide 2,129.0; TSH 1.057 01/10/2021: ALT 14; Magnesium 1.9 01/11/2021: BUN 49; Creatinine, Ser 6.91; Hemoglobin 10.9; Platelets 164; Potassium 4.8; Sodium 135   Lipid Panel    Component Value Date/Time   CHOL 127 01/11/2021 0259   TRIG 186 (H) 01/11/2021 0259   HDL 25 (L) 01/11/2021 0259   CHOLHDL 5.1 01/11/2021 0259   VLDL 37 01/11/2021 0259   LDLCALC 65 01/11/2021 0259    Additional studies/ records that were reviewed today include:   Cardiac Catheterization: 12/2020  There is mild left ventricular systolic dysfunction. The left ventricular ejection fraction is 50-55% by visual estimate.  LV end diastolic pressure is moderately elevated.  There is no aortic valve stenosis.  -------------------------------------------  Ost LM to Dist LM STENT is 95% stenosed. Ost Cx to Mid Cx STENT is 99% stenosed.  JAILED 1st Mrg-1 lesion is 99% stenosed. 1st Mrg-2 lesion is 100% stenosed.  Ost LAD to Prox LAD lesion is 100% stenosed.  Prox RCA lesion  is 100% stenosed.  LIMA graft was visualized by non-selective angiography and is normal in caliber. Origin to Prox Graft STENT is 60% stenosed.  SeqSVG OM-LPL graft was not visualized due to known occlusion. Origin to Prox Graft  lesion before 1st Mrg is 100% stenosed.  SVG-mRCA graft was visualized by angiography. Origin STENT is 20% stenosed.  Prox SVG-RCA Graft lesion is 75% stenosed.  A drug-eluting stent was successfully placed (overlapping previous stent) using a SYNERGY XD 2.50X16. Postdilated to 2.75 mm.  Post intervention, there is a 0% residual stenosis.  Mid RCA to Dist RCA STENT (placed through SVG) is 30% stenosed.   SUMMARY  Likely culprit lesion-75% ulcerated lesion in SVG-RCA ->  ? s/p successful DES PCI overlapping ostial stent with Synergy DES 2.5 mg 60 mm postdilated to 2.75 mm.  Severe native CAD with occluded ostial LAD, 95% ostial OM1 with extensive stent ISR in the LM-LCx with least 95% stenosis RCA CTO.  Patent LIMA-LAD-but unable to engage the left subclavian artery due to stent extending into the aorta.  Nonselective imaging reveals TIMI-3 flow in the LIMA graft, there does appear to be some in-stent restenosis at least 50% in the ostial stent.  Known occlusion of the SVG-OM-LPL,   Severe Systemic Hypertension with moderate to severely elevated LVEDP 22 mmHg   RECOMMENDATION  Return to nursing for ongoing care.  Consider noninvasive imaging such as Myoview to evaluate for anterior ischemia, if warranted, would then probably require left brachial access to allow for LIMA angiography and possible PCI.  Needs aggressive respiratory medication with blood pressure control  Patient noted heartburn related chest discomfort along with nausea after being given additional 300 mg oral Plavix post PCI.    Assessment:    1. Coronary artery disease of native artery of native heart with stable angina pectoris ([place])   2. Essential hypertension   3. Hyperlipidemia LDL goal <70   4. Bilateral carotid artery stenosis   5. ESRD on hemodialysis ([place])      Plan:   In order of problems listed above:  1. CAD - She is s/p CABG in 2005 with multiple PCI's since and has not felt to be a  candidate for re-do CABG due to her high-risk status and poor target vessels. Most recently underwent DESx2 to SVG to RCA last month.  - We discussed a possible repeat stress test if recurrent anginal symptoms as previously discussed by the Interventional Team but thankfully she has not experienced symptoms as severe as her prior angina. I encouraged her to reach out if she developed any progressive symptoms as a Lexiscan Myoview could be arranged.  - Continue ASA, Plavix, Atorvastatin and Ranexa. She has been intolerant to further titration of medical therapy (including BB and Imdur) due to hypotension during HD.   2. HTN - BP is at 142/48 today but she reports significant hypotension on HD days. Continue current medical therapy for now.   3. HLD - FLP in 12/2020 showed total cholesterol of 127, HDL 25, triglycerides 186 and LDL 65. She remains on Atorvastatin 80mg  daily.   4. Carotid Artery Stenosis - She is s/p R CEA in 2014 which is followed by Vascular Surgery. Continue ASA, Plavix and statin therapy.   5. ESRD - On HD - Tuesday, Thursday and Saturday schedule.    Medication Adjustments/Labs and Tests Ordered: Current medicines are reviewed at length with the patient today.  Concerns regarding medicines are outlined above.  Medication changes, Labs and Tests ordered  today are listed in the Patient Instructions below. Patient Instructions  Medication Instructions:  Your physician recommends that you continue on your current medications as directed. Please refer to the Current Medication list given to you today.  *If you need a refill on your cardiac medications before your next appointment, please call your pharmacy*   Lab Work: Requested lab work from [employer] If you have labs (blood work) drawn today and your tests are completely normal, you will receive your results only by: Marland Kitchen MyChart Message (if you have MyChart) OR . A paper copy in the mail If you have any lab  test that is abnormal or we need to change your treatment, we will call you to review the results.   Testing/Procedures: None   Follow-Up: At [facility], you and your health needs are our priority.  As part of our continuing mission to provide you with exceptional heart care, we have created designated Provider Care Teams.  These Care Teams include your primary Cardiologist (physician) and Advanced Practice Providers (APPs -  Physician Assistants and Nurse Practitioners) who all work together to provide you with the care you need, when you need it.  We recommend signing up for the patient portal called "MyChart".  Sign up information is provided on this After Visit Summary.  MyChart is used to connect with patients for Virtual Visits (Telemedicine).  Patients are able to view lab/test results, encounter notes, upcoming appointments, etc.  Non-urgent messages can be sent to your provider as well.   To learn more about what you can do with MyChart, go to [email or web address].    Your next appointment:   2-3 month(s)  The format for your next appointment:   In Person  Provider:   Rozann Lesches, MD   Other Instructions       Signed, Donna Heritage, PA-C  01/29/2021 10:21 AM    [place] [address] [place], [place] [address] Phone: [phone] Fax: [phone]

## 2021-01-28 NOTE — Patient Instructions (Addendum)
Medication Instructions:  Your physician recommends that you continue on your current medications as directed. Please refer to the Current Medication list given to you today.  *If you need a refill on your cardiac medications before your next appointment, please call your pharmacy*   Lab Work: Requested lab work from [employer] If you have labs (blood work) drawn today and your tests are completely normal, you will receive your results only by: Marland Kitchen MyChart Message (if you have MyChart) OR . A paper copy in the mail If you have any lab test that is abnormal or we need to change your treatment, we will call you to review the results.   Testing/Procedures: None   Follow-Up: At [facility], you and your health needs are our priority.  As part of our continuing mission to provide you with exceptional heart care, we have created designated Provider Care Teams.  These Care Teams include your primary Cardiologist (physician) and Advanced Practice Providers (APPs -  Physician Assistants and Nurse Practitioners) who all work together to provide you with the care you need, when you need it.  We recommend signing up for the patient portal called "MyChart".  Sign up information is provided on this After Visit Summary.  MyChart is used to connect with patients for Virtual Visits (Telemedicine).  Patients are able to view lab/test results, encounter notes, upcoming appointments, etc.  Non-urgent messages can be sent to your provider as well.   To learn more about what you can do with MyChart, go to [email or web address].    Your next appointment:   2-3 month(s)  The format for your next appointment:   In Person  Provider:   Rozann Lesches, MD   Other Instructions

## 2021-01-29 ENCOUNTER — Encounter: Payer: Self-pay | Admitting: Student

## 2021-01-29 DIAGNOSIS — N186 End stage renal disease: Secondary | ICD-10-CM | POA: Diagnosis not present

## 2021-01-29 DIAGNOSIS — N2581 Secondary hyperparathyroidism of renal origin: Secondary | ICD-10-CM | POA: Diagnosis not present

## 2021-01-29 DIAGNOSIS — Z992 Dependence on renal dialysis: Secondary | ICD-10-CM | POA: Diagnosis not present

## 2021-01-29 DIAGNOSIS — D689 Coagulation defect, unspecified: Secondary | ICD-10-CM | POA: Diagnosis not present

## 2021-01-31 DIAGNOSIS — H2513 Age-related nuclear cataract, bilateral: Secondary | ICD-10-CM | POA: Diagnosis not present

## 2021-01-31 DIAGNOSIS — H2512 Age-related nuclear cataract, left eye: Secondary | ICD-10-CM | POA: Diagnosis not present

## 2021-01-31 DIAGNOSIS — H524 Presbyopia: Secondary | ICD-10-CM | POA: Diagnosis not present

## 2021-01-31 DIAGNOSIS — H25043 Posterior subcapsular polar age-related cataract, bilateral: Secondary | ICD-10-CM | POA: Diagnosis not present

## 2021-01-31 DIAGNOSIS — H2589 Other age-related cataract: Secondary | ICD-10-CM | POA: Diagnosis not present

## 2021-01-31 DIAGNOSIS — H25013 Cortical age-related cataract, bilateral: Secondary | ICD-10-CM | POA: Diagnosis not present

## 2021-01-31 LAB — HM DIABETES EYE EXAM

## 2021-02-01 ENCOUNTER — Telehealth: Payer: Self-pay | Admitting: Student

## 2021-02-01 DIAGNOSIS — N186 End stage renal disease: Secondary | ICD-10-CM | POA: Diagnosis not present

## 2021-02-01 DIAGNOSIS — Z992 Dependence on renal dialysis: Secondary | ICD-10-CM | POA: Diagnosis not present

## 2021-02-01 DIAGNOSIS — E1122 Type 2 diabetes mellitus with diabetic chronic kidney disease: Secondary | ICD-10-CM | POA: Diagnosis not present

## 2021-02-01 DIAGNOSIS — N2581 Secondary hyperparathyroidism of renal origin: Secondary | ICD-10-CM | POA: Diagnosis not present

## 2021-02-01 DIAGNOSIS — D689 Coagulation defect, unspecified: Secondary | ICD-10-CM | POA: Diagnosis not present

## 2021-02-01 NOTE — Telephone Encounter (Signed)
Donna Howe is returning Scott's call. Please advise.

## 2021-02-01 NOTE — Telephone Encounter (Signed)
   [facility] Pre-operative Risk Assessment    Patient Name: Donna Howe  DOB: Feb 01, 1956  MRN: [id number]   HEARTCARE STAFF: - Please ensure there is not already an duplicate clearance open for this procedure. - Under Visit Info/Reason for Call, type in Other and utilize the format Clearance MM/DD/YY or Clearance TBD. Do not use dashes or single digits. - If request is for dental extraction, please clarify the # of teeth to be extracted.  Request for surgical clearance:  1. What type of surgery is being performed?  Cataract Surgery  2. When is this surgery scheduled? 02/15/21  3. What type of clearance is required (medical clearance vs. Pharmacy clearance to hold med vs. Both)? both  4. Are there any medications that need to be held prior to surgery and how long? Please advise if patient needs to hold any medications  5. Practice name and name of physician performing surgery? Monna Fam md for [facility]  6. What is the office phone number? [phone]    7.   What is the office fax number? [phone]  8.   Anesthesia type (None, local, MAC, general) ? local   Jannet Askew 02/01/2021, 8:02 AM  _________________________________________________________________   (provider comments below)

## 2021-02-01 NOTE — Telephone Encounter (Signed)
Notes faxed to surgeon. This phone note will be removed from the preop pool. Richardson Dopp, PA-C  02/01/2021 3:09 PM

## 2021-02-01 NOTE — Telephone Encounter (Signed)
   Patient Name: Donna Howe  DOB: Jan 14, 1956  MRN: [id number]   Primary Cardiologist: Rozann Lesches, MD  Cataract surgery is considered low risk and does not require testing and anticoagulation/antiplatelet Rx does not need to be held.  However, pt was just seen for f/u after presenting with a NSTEMI in late April 2022 tx with DES x 2 to [facility].  It is felt that she could have a f/u Myoview if she has continued angina to assess for anterior ischemia (L-LAD suboptimally imaged).   Given extensive coronary artery disease hx, will reach out to pt to ensure she is not having any unstable symptoms.  Message left for pt to call back.  Richardson Dopp, PA-C    02/01/2021 8:35 AM

## 2021-02-01 NOTE — Telephone Encounter (Signed)
   Patient Name: Donna Howe  DOB: [birth year]  MRN: [id number]   Primary Cardiologist: Rozann Lesches, MD  Chart reviewed as part of pre-operative protocol coverage. Pt was just seen in the office 01/28/21 after a recent admission for a NSTEMI treated with a DES x 2 to the [facility].  She was contacted today (02/01/2021) and she has been doing well without any change in her symptoms since her visit last week.    Cataract extractions are recognized in guidelines as low risk surgeries that do not typically require specific preoperative testing or holding of blood thinner therapy. Therefore, given past medical history and time since last visit, based on ACC/AHA guidelines, Donna Howe would be at acceptable risk for the planned procedure without further cardiovascular testing. ASA and Clopidogrel (Plavix) should be continued without interruption.  Please call with questions.  Richardson Dopp, PA-C 02/01/2021, 3:04 PM

## 2021-02-03 ENCOUNTER — Other Ambulatory Visit (HOSPITAL_COMMUNITY): Payer: Self-pay | Admitting: Pediatrics

## 2021-02-03 ENCOUNTER — Other Ambulatory Visit (HOSPITAL_COMMUNITY): Payer: Self-pay | Admitting: Nephrology

## 2021-02-03 ENCOUNTER — Other Ambulatory Visit: Payer: Self-pay | Admitting: Nephrology

## 2021-02-03 DIAGNOSIS — N2581 Secondary hyperparathyroidism of renal origin: Secondary | ICD-10-CM | POA: Diagnosis not present

## 2021-02-03 DIAGNOSIS — Z992 Dependence on renal dialysis: Secondary | ICD-10-CM | POA: Diagnosis not present

## 2021-02-03 DIAGNOSIS — N186 End stage renal disease: Secondary | ICD-10-CM | POA: Diagnosis not present

## 2021-02-03 DIAGNOSIS — E1122 Type 2 diabetes mellitus with diabetic chronic kidney disease: Secondary | ICD-10-CM | POA: Diagnosis not present

## 2021-02-03 DIAGNOSIS — M542 Cervicalgia: Secondary | ICD-10-CM

## 2021-02-03 DIAGNOSIS — D689 Coagulation defect, unspecified: Secondary | ICD-10-CM | POA: Diagnosis not present

## 2021-02-04 ENCOUNTER — Other Ambulatory Visit: Payer: Self-pay

## 2021-02-04 ENCOUNTER — Encounter: Payer: Self-pay | Admitting: Vascular Surgery

## 2021-02-04 ENCOUNTER — Encounter: Payer: Self-pay | Admitting: *Deleted

## 2021-02-04 ENCOUNTER — Ambulatory Visit: Payer: Medicare HMO | Admitting: Vascular Surgery

## 2021-02-04 ENCOUNTER — Other Ambulatory Visit: Payer: Self-pay | Admitting: *Deleted

## 2021-02-04 VITALS — BP 119/63 | HR 78 | Resp 20 | Ht 65.0 in | Wt 204.0 lb

## 2021-02-04 DIAGNOSIS — N186 End stage renal disease: Secondary | ICD-10-CM

## 2021-02-04 DIAGNOSIS — Z992 Dependence on renal dialysis: Secondary | ICD-10-CM

## 2021-02-04 NOTE — Progress Notes (Signed)
Patient ID: Donna Howe, female   DOB: [birth year], 65 y.o.   MRN: [id number]  Reason for Consult: Follow-up   Referred by Rockwood Nation, MD  Subjective:     HPI:  Donna Howe is a 65 y.o. female history of end-stage renal disease currently dialyzing via right IJ catheter.  She previously had a cephalic vein AV fistula on the left which had to be ligated.  This was done for numbness.  Her numbness has improved although continues in her middle and ring finger on the left.  She has no tissue loss or ulceration.  She is on aspirin and Plavix daily.  She states that recently she has pain in her neck during dialysis causing weakness.  She has not had frank stroke symptoms.  She is set for a CT to evaluate.  She states that her catheter is working well.  Past Medical History:  Diagnosis Date  . Anemia   . Anxiety   . Asthma   . CAD (coronary artery disease)    Multivessel s/p CABG 2005, numerous PCIs since that time and documented graft disease  . Carotid artery disease (HCC)    R CEA  . ESRD on hemodialysis ([place])   . Essential hypertension   . Gout   . History of blood transfusion   . Hyperlipidemia   . Hypothyroidism   . Myocardial infarction ([place])   . PAD (peripheral artery disease) ([place])    Dr. Kellie Simmering  . Pneumonia 09/2019, 11/2019  . S/P angioplasty with stent- DES to [facility] and to [facility] to LAD with DES 04/09/18.   04/10/2018  . SBO (small bowel obstruction) ([place]) 2011   Status post lysis of adhesions & hernia repair  . Sinus bradycardia   . Type 2 diabetes mellitus ([place])   . Umbilical hernia    Family History  Problem Relation Age of Onset  . Diabetes Mother   . Heart disease Mother        before age 46  . Hyperlipidemia Mother   . Hypertension Mother   . Thyroid disease Father   . Hypertension Father   . AAA (abdominal aortic aneurysm) Father   . Heart disease Brother        before age 78  . Hypertension Brother   . Hyperlipidemia Son   . Hypertension Son     Past Surgical History:  Procedure Laterality Date  . AORTIC ARCH ANGIOGRAPHY N/A 08/02/2020   Procedure: AORTIC ARCH ANGIOGRAPHY;  Surgeon: Waynetta Sandy, MD;  Location: [place] [facility];  Service: Cardiovascular;  Laterality: N/A;  Lt upper extermity  . AV FISTULA PLACEMENT Left 06/29/2020   Procedure: LEFT ARM ARTERIOVENOUS (AV) FISTULA;  Surgeon: Waynetta Sandy, MD;  Location: [place];  Service: Vascular;  Laterality: Left;  ARM  . CESAREAN SECTION  1984  . CHOLECYSTECTOMY  2010  . CORONARY ARTERY BYPASS GRAFT  2005  . CORONARY BALLOON ANGIOPLASTY N/A 05/31/2017   Procedure: CORONARY BALLOON ANGIOPLASTY;  Surgeon: Jettie Booze, MD;  Location: [place] [facility];  Service: Cardiovascular;  Laterality: N/A;  . CORONARY STENT INTERVENTION N/A 05/31/2017   Procedure: CORONARY STENT INTERVENTION;  Surgeon: Jettie Booze, MD;  Location: [place] [facility];  Service: Cardiovascular;  Laterality: N/A;  . CORONARY STENT INTERVENTION N/A 04/09/2018   Procedure: CORONARY STENT INTERVENTION;  Surgeon: Jettie Booze, MD;  Location: [place] [facility];  Service: Cardiovascular;  Laterality: N/A;  SVG RCA  . CORONARY  STENT INTERVENTION N/A 01/10/2021   Procedure: CORONARY STENT INTERVENTION;  Surgeon: Leonie Man, MD;  Location: [place] [facility];  Service: Cardiovascular;  Laterality: N/A;  . ENDARTERECTOMY Right 04/18/2013   Procedure: ENDARTERECTOMY CAROTID;  Surgeon: Mal Misty, MD;  Location: [place];  Service: Vascular;  Laterality: Right;  . HERNIA REPAIR  1989  . Incisional hernia repair x2  03/04/2010   Laparoscopic with 35cm mesh by Dr Ronnald Collum  . IR FLUORO GUIDE CV LINE RIGHT  06/21/2020  . IR US GUIDE VASC ACCESS RIGHT  06/21/2020  . LEFT HEART CATH AND CORS/GRAFTS ANGIOGRAPHY N/A 05/31/2017   Procedure: LEFT HEART CATH AND CORS/GRAFTS ANGIOGRAPHY;  Surgeon: Jettie Booze, MD;  Location: [place] [facility];  Service: Cardiovascular;   Laterality: N/A;  . LEFT HEART CATH AND CORS/GRAFTS ANGIOGRAPHY N/A 04/08/2018   Procedure: LEFT HEART CATH AND CORS/GRAFTS ANGIOGRAPHY;  Surgeon: Jettie Booze, MD;  Location: [place] [facility];  Service: Cardiovascular;  Laterality: N/A;  . LEFT HEART CATH AND CORS/GRAFTS ANGIOGRAPHY N/A 06/22/2020   Procedure: LEFT HEART CATH AND CORS/GRAFTS ANGIOGRAPHY;  Surgeon: Belva Crome, MD;  Location: [place] [facility];  Service: Cardiovascular;  Laterality: N/A;  . LEFT HEART CATH AND CORS/GRAFTS ANGIOGRAPHY N/A 01/10/2021   Procedure: LEFT HEART CATH AND CORS/GRAFTS ANGIOGRAPHY;  Surgeon: Leonie Man, MD;  Location: [place] [facility];  Service: Cardiovascular;  Laterality: N/A;  . LEFT HEART CATHETERIZATION WITH CORONARY ANGIOGRAM N/A 12/19/2012   Procedure: LEFT HEART CATHETERIZATION WITH CORONARY ANGIOGRAM;  Surgeon: Josue Hector, MD;  Location: [place] [facility];  Service: Cardiovascular;  Laterality: N/A;  . LEFT HEART CATHETERIZATION WITH CORONARY/GRAFT ANGIOGRAM N/A 04/19/2013   Procedure: LEFT HEART CATHETERIZATION WITH Beatrix Fetters;  Surgeon: Lorretta Harp, MD;  Location[facility] C[facility];  Service: Cardiovascular;  Laterality: N/A;  . LIGATION OF ARTERIOVENOUS  FISTULA Left 09/15/2020   Procedure: LIGATION OF LEFT ARM ARTERIOVENOUS  FISTULA;  Surgeon: Waynetta Sandy, MD;  Location: [place];  Service: Vascular;  Laterality: Left;  . PATCH ANGIOPLASTY Right 04/18/2013   Procedure: PATCH ANGIOPLASTY Right Internal Carotid Artery;  Surgeon: Mal Misty, MD;  Location: [facility];  Service: Vascular;  Laterality: Right;  . PERCUTANEOUS CORONARY STENT INTERVENTION (PCI-S) Right 12/19/2012   Procedure: PERCUTANEOUS CORONARY STENT INTERVENTION (PCI-S);  Surgeon: Josue Hector, MD;  Location: [facility] CATH LAB;  Service: Cardiovascular;  Laterality: Right;  . PERIPHERAL VASCULAR INTERVENTION Left 08/02/2020   Procedure: PERIPHERAL VASCULAR INTERVENTION;  Surgeon: Waynetta Sandy, MD;  Location: [place] [facility];  Service: Cardiovascular;  Laterality: Left;  Left subclavian  . SHOULDER SURGERY      Short Social History:  Social History   Tobacco Use  . Smoking status: Former Smoker    Packs/day: 1.00    Years: 20.00    Pack years: 20.00    Types: Cigarettes    Quit date: 12/10/2012    Years since quitting: 8.1  . Smokeless tobacco: Never Used  Substance Use Topics  . Alcohol use: No    Alcohol/week: 0.0 standard drinks    Allergies  Allergen Reactions  . Penicillins Other (See Comments)    REACTION: Unknown, told as a child Has patient had a PCN reaction causing immediate rash, facial/tongue/throat swelling, SOB or lightheadedness with hypotension: Unknown Has patient had a PCN reaction causing severe rash involving mucus membranes or skin necrosis: Unknown Has patient had a PCN reaction that required hospitalization: Unknown  Has patient had a PCN reaction occurring within the last 10 years: No If all of the above answers are "NO", then may proceed with Cephalosporin use.   . Beta Adrenergic Blockers Other (See Comments)    Hypotension    Current Outpatient Medications  Medication Sig Dispense Refill  . Albuterol Sulfate 108 (90 Base) MCG/ACT AEPB Inhale 2 puffs into the lungs every 4 (four) hours as needed (Shortness of breath).     Marland Kitchen allopurinol (ZYLOPRIM) 100 MG tablet Take one tablet after hemodialysis, on Tuesday, Thursday and Saturday. (Patient taking differently: Take 100 mg by mouth Every Tuesday,Thursday,and Saturday with dialysis. After dialysis) 30 tablet 11  . ALPRAZolam (XANAX) 0.5 MG tablet Take 0.25-0.5 mg by mouth See admin instructions. Take 0.25 in the morning and evening and 0.5 mg at bedtime    . aspirin EC 81 MG tablet Take 81 mg by mouth daily.     Marland Kitchen atorvastatin (LIPITOR) 80 MG tablet Take 1 tablet (80 mg total) by mouth every evening. (Patient taking differently: Take 80 mg by mouth at bedtime.)    . clopidogrel  (PLAVIX) 75 MG tablet Take 1 tablet (75 mg total) by mouth daily. 90 tablet 3  . diclofenac Sodium (VOLTAREN) 1 % GEL Apply 2 g topically 4 (four) times daily as needed (apply to right foot big toe as needed for pain.). 50 g 0  . ferric citrate (AURYXIA) 1 GM 210 MG(Fe) tablet Take 420 mg by mouth 3 (three) times daily with meals.    . gabapentin (NEURONTIN) 400 MG capsule Take 1 capsule (400 mg total) by mouth 2 (two) times daily.    Marland Kitchen ipratropium (ATROVENT HFA) 17 MCG/ACT inhaler Inhale 2 puffs into the lungs every 4 (four) hours as needed for wheezing.     Marland Kitchen levothyroxine (SYNTHROID) 112 MCG tablet Take 112 mcg by mouth daily before breakfast.    . nitroGLYCERIN (NITROSTAT) 0.4 MG SL tablet Place 0.4 mg under the tongue every 5 (five) minutes x 3 doses as needed for chest pain. Not to exceed 3 in 15 minute time frame    . NOVOLIN 70/30 FLEXPEN (70-30) 100 UNIT/ML KwikPen Inject 24 Units into the skin in the morning and at bedtime.    . ondansetron (ZOFRAN) 8 MG tablet Take 8 mg by mouth every 8 (eight) hours as needed for nausea.    . pantoprazole (PROTONIX) 40 MG tablet Take 1 tablet (40 mg total) by mouth 2 (two) times daily. 60 tablet 1  . ranolazine (RANEXA) 500 MG 12 hr tablet Take 1 tablet (500 mg total) by mouth 2 (two) times daily. (Patient taking differently: Take 500 mg by mouth at bedtime.) 60 tablet 1  . Vitamin D, Ergocalciferol, (DRISDOL) 1.25 MG (50000 UNIT) CAPS capsule Take 50,000 Units by mouth every Sunday.      No current facility-administered medications for this visit.    Review of Systems  Constitutional:  Constitutional negative. HENT:       Neck pain Eyes: Positive for visual disturbance.   Cardiovascular: Cardiovascular negative.  GI: Gastrointestinal negative.  Skin: Skin negative.  Neurological: Positive for numbness.  Hematologic: Hematologic/lymphatic negative.  Psychiatric: Psychiatric negative.        Objective:  Objective   Vitals:   02/04/21 0924   BP: 119/63  Pulse: 78  Resp: 20  SpO2: 99%  Weight: 204 lb (92.5 kg)  Height: 5\' 5"  (1.651 m)   Body mass index is 33.95 kg/m.  Physical Exam HENT:  Head: Normocephalic.     Nose:     Comments: Wearing a mask Eyes:     Pupils: Pupils are equal, round, and reactive to light.  Cardiovascular:     Rate and Rhythm: Normal rate.     Comments: Bilateral brachial arteries are palpable Pulmonary:     Effort: Pulmonary effort is normal.  Abdominal:     General: Abdomen is flat.  Skin:    General: Skin is warm.     Capillary Refill: Capillary refill takes less than 2 seconds.  Neurological:     General: No focal deficit present.     Mental Status: She is alert.  Psychiatric:        Mood and Affect: Mood normal.        Behavior: Behavior normal.        Thought Content: Thought content normal.        Judgment: Judgment normal.     Data: No new studies     Assessment/Plan:     65 year old female previous right carotid endarterectomy now with end-stage renal disease dialyzing via catheter.  She does have neck pain on dialysis only with weakness she is sent for CT scan for this.  Her catheter is working well.  We discussed our options being continued catheter versus switching to her right arm.  Previously did not have any suitable vein for access creation.  We will plan for fistula versus graft more likely.  I discussed the risk benefits and alternatives and she demonstrates good understanding.  She does have multiple procedures pending we will schedule this on a nondialysis day around her other procedures.  She can continue aspirin Plavix throughout the surgical time.     Waynetta Sandy MD [facility]

## 2021-02-05 DIAGNOSIS — D689 Coagulation defect, unspecified: Secondary | ICD-10-CM | POA: Diagnosis not present

## 2021-02-05 DIAGNOSIS — E1122 Type 2 diabetes mellitus with diabetic chronic kidney disease: Secondary | ICD-10-CM | POA: Diagnosis not present

## 2021-02-05 DIAGNOSIS — N186 End stage renal disease: Secondary | ICD-10-CM | POA: Diagnosis not present

## 2021-02-05 DIAGNOSIS — N2581 Secondary hyperparathyroidism of renal origin: Secondary | ICD-10-CM | POA: Diagnosis not present

## 2021-02-05 DIAGNOSIS — Z992 Dependence on renal dialysis: Secondary | ICD-10-CM | POA: Diagnosis not present

## 2021-02-08 DIAGNOSIS — Z992 Dependence on renal dialysis: Secondary | ICD-10-CM | POA: Diagnosis not present

## 2021-02-08 DIAGNOSIS — D689 Coagulation defect, unspecified: Secondary | ICD-10-CM | POA: Diagnosis not present

## 2021-02-08 DIAGNOSIS — N186 End stage renal disease: Secondary | ICD-10-CM | POA: Diagnosis not present

## 2021-02-08 DIAGNOSIS — N2581 Secondary hyperparathyroidism of renal origin: Secondary | ICD-10-CM | POA: Diagnosis not present

## 2021-02-10 DIAGNOSIS — E1140 Type 2 diabetes mellitus with diabetic neuropathy, unspecified: Secondary | ICD-10-CM | POA: Diagnosis not present

## 2021-02-10 DIAGNOSIS — Z8679 Personal history of other diseases of the circulatory system: Secondary | ICD-10-CM | POA: Diagnosis not present

## 2021-02-10 DIAGNOSIS — D689 Coagulation defect, unspecified: Secondary | ICD-10-CM | POA: Diagnosis not present

## 2021-02-10 DIAGNOSIS — E7849 Other hyperlipidemia: Secondary | ICD-10-CM | POA: Diagnosis not present

## 2021-02-10 DIAGNOSIS — J449 Chronic obstructive pulmonary disease, unspecified: Secondary | ICD-10-CM | POA: Diagnosis not present

## 2021-02-10 DIAGNOSIS — N186 End stage renal disease: Secondary | ICD-10-CM | POA: Diagnosis not present

## 2021-02-10 DIAGNOSIS — Z992 Dependence on renal dialysis: Secondary | ICD-10-CM | POA: Diagnosis not present

## 2021-02-10 DIAGNOSIS — I10 Essential (primary) hypertension: Secondary | ICD-10-CM | POA: Diagnosis not present

## 2021-02-10 DIAGNOSIS — I214 Non-ST elevation (NSTEMI) myocardial infarction: Secondary | ICD-10-CM | POA: Diagnosis not present

## 2021-02-10 DIAGNOSIS — N2581 Secondary hyperparathyroidism of renal origin: Secondary | ICD-10-CM | POA: Diagnosis not present

## 2021-02-11 ENCOUNTER — Ambulatory Visit (HOSPITAL_COMMUNITY): Payer: Medicare HMO

## 2021-02-12 DIAGNOSIS — N186 End stage renal disease: Secondary | ICD-10-CM | POA: Diagnosis not present

## 2021-02-12 DIAGNOSIS — Z992 Dependence on renal dialysis: Secondary | ICD-10-CM | POA: Diagnosis not present

## 2021-02-12 DIAGNOSIS — N2581 Secondary hyperparathyroidism of renal origin: Secondary | ICD-10-CM | POA: Diagnosis not present

## 2021-02-12 DIAGNOSIS — D689 Coagulation defect, unspecified: Secondary | ICD-10-CM | POA: Diagnosis not present

## 2021-02-14 DIAGNOSIS — N2581 Secondary hyperparathyroidism of renal origin: Secondary | ICD-10-CM | POA: Diagnosis not present

## 2021-02-14 DIAGNOSIS — D689 Coagulation defect, unspecified: Secondary | ICD-10-CM | POA: Diagnosis not present

## 2021-02-14 DIAGNOSIS — Z992 Dependence on renal dialysis: Secondary | ICD-10-CM | POA: Diagnosis not present

## 2021-02-14 DIAGNOSIS — N186 End stage renal disease: Secondary | ICD-10-CM | POA: Diagnosis not present

## 2021-02-14 DIAGNOSIS — Z87891 Personal history of nicotine dependence: Secondary | ICD-10-CM | POA: Diagnosis not present

## 2021-02-14 DIAGNOSIS — Z794 Long term (current) use of insulin: Secondary | ICD-10-CM | POA: Diagnosis not present

## 2021-02-14 DIAGNOSIS — E1165 Type 2 diabetes mellitus with hyperglycemia: Secondary | ICD-10-CM | POA: Diagnosis not present

## 2021-02-14 DIAGNOSIS — I70201 Unspecified atherosclerosis of native arteries of extremities, right leg: Secondary | ICD-10-CM | POA: Diagnosis not present

## 2021-02-14 DIAGNOSIS — I10 Essential (primary) hypertension: Secondary | ICD-10-CM | POA: Diagnosis not present

## 2021-02-15 ENCOUNTER — Ambulatory Visit (HOSPITAL_COMMUNITY): Payer: Medicare HMO

## 2021-02-15 ENCOUNTER — Encounter (HOSPITAL_COMMUNITY): Payer: Self-pay

## 2021-02-15 DIAGNOSIS — H2512 Age-related nuclear cataract, left eye: Secondary | ICD-10-CM | POA: Diagnosis not present

## 2021-02-15 DIAGNOSIS — E1122 Type 2 diabetes mellitus with diabetic chronic kidney disease: Secondary | ICD-10-CM | POA: Diagnosis not present

## 2021-02-15 DIAGNOSIS — Z992 Dependence on renal dialysis: Secondary | ICD-10-CM | POA: Diagnosis not present

## 2021-02-15 DIAGNOSIS — N186 End stage renal disease: Secondary | ICD-10-CM | POA: Diagnosis not present

## 2021-02-15 DIAGNOSIS — H25812 Combined forms of age-related cataract, left eye: Secondary | ICD-10-CM | POA: Diagnosis not present

## 2021-02-16 DIAGNOSIS — E1122 Type 2 diabetes mellitus with diabetic chronic kidney disease: Secondary | ICD-10-CM | POA: Diagnosis not present

## 2021-02-16 DIAGNOSIS — N186 End stage renal disease: Secondary | ICD-10-CM | POA: Diagnosis not present

## 2021-02-16 DIAGNOSIS — Z992 Dependence on renal dialysis: Secondary | ICD-10-CM | POA: Diagnosis not present

## 2021-02-17 DIAGNOSIS — N186 End stage renal disease: Secondary | ICD-10-CM | POA: Diagnosis not present

## 2021-02-17 DIAGNOSIS — D689 Coagulation defect, unspecified: Secondary | ICD-10-CM | POA: Diagnosis not present

## 2021-02-17 DIAGNOSIS — Z992 Dependence on renal dialysis: Secondary | ICD-10-CM | POA: Diagnosis not present

## 2021-02-17 DIAGNOSIS — N2581 Secondary hyperparathyroidism of renal origin: Secondary | ICD-10-CM | POA: Diagnosis not present

## 2021-02-18 DIAGNOSIS — Z1231 Encounter for screening mammogram for malignant neoplasm of breast: Secondary | ICD-10-CM | POA: Diagnosis not present

## 2021-02-19 DIAGNOSIS — D689 Coagulation defect, unspecified: Secondary | ICD-10-CM | POA: Diagnosis not present

## 2021-02-19 DIAGNOSIS — N186 End stage renal disease: Secondary | ICD-10-CM | POA: Diagnosis not present

## 2021-02-19 DIAGNOSIS — Z992 Dependence on renal dialysis: Secondary | ICD-10-CM | POA: Diagnosis not present

## 2021-02-19 DIAGNOSIS — N2581 Secondary hyperparathyroidism of renal origin: Secondary | ICD-10-CM | POA: Diagnosis not present

## 2021-02-22 ENCOUNTER — Inpatient Hospital Stay (HOSPITAL_COMMUNITY)
Admission: EM | Admit: 2021-02-22 | Discharge: 2021-02-25 | DRG: 246 | Disposition: A | Discharge status: Home / Self Care | Payer: Medicare HMO | Attending: Internal Medicine | Admitting: Internal Medicine

## 2021-02-22 ENCOUNTER — Encounter (HOSPITAL_COMMUNITY): Payer: Self-pay | Admitting: *Deleted

## 2021-02-22 ENCOUNTER — Emergency Department (HOSPITAL_COMMUNITY): Payer: Medicare HMO

## 2021-02-22 ENCOUNTER — Other Ambulatory Visit: Payer: Self-pay

## 2021-02-22 DIAGNOSIS — I5084 End stage heart failure: Secondary | ICD-10-CM | POA: Diagnosis present

## 2021-02-22 DIAGNOSIS — E039 Hypothyroidism, unspecified: Secondary | ICD-10-CM | POA: Diagnosis not present

## 2021-02-22 DIAGNOSIS — Z83438 Family history of other disorder of lipoprotein metabolism and other lipidemia: Secondary | ICD-10-CM

## 2021-02-22 DIAGNOSIS — I132 Hypertensive heart and chronic kidney disease with heart failure and with stage 5 chronic kidney disease, or end stage renal disease: Secondary | ICD-10-CM | POA: Diagnosis not present

## 2021-02-22 DIAGNOSIS — E1122 Type 2 diabetes mellitus with diabetic chronic kidney disease: Secondary | ICD-10-CM | POA: Diagnosis not present

## 2021-02-22 DIAGNOSIS — Y718 Miscellaneous cardiovascular devices associated with adverse incidents, not elsewhere classified: Secondary | ICD-10-CM | POA: Diagnosis present

## 2021-02-22 DIAGNOSIS — R0600 Dyspnea, unspecified: Secondary | ICD-10-CM | POA: Diagnosis not present

## 2021-02-22 DIAGNOSIS — Z8349 Family history of other endocrine, nutritional and metabolic diseases: Secondary | ICD-10-CM

## 2021-02-22 DIAGNOSIS — R072 Precordial pain: Secondary | ICD-10-CM

## 2021-02-22 DIAGNOSIS — Z888 Allergy status to other drugs, medicaments and biological substances status: Secondary | ICD-10-CM

## 2021-02-22 DIAGNOSIS — F419 Anxiety disorder, unspecified: Secondary | ICD-10-CM | POA: Diagnosis present

## 2021-02-22 DIAGNOSIS — I252 Old myocardial infarction: Secondary | ICD-10-CM | POA: Diagnosis not present

## 2021-02-22 DIAGNOSIS — E119 Type 2 diabetes mellitus without complications: Secondary | ICD-10-CM | POA: Diagnosis not present

## 2021-02-22 DIAGNOSIS — I739 Peripheral vascular disease, unspecified: Secondary | ICD-10-CM | POA: Diagnosis present

## 2021-02-22 DIAGNOSIS — E785 Hyperlipidemia, unspecified: Secondary | ICD-10-CM | POA: Diagnosis present

## 2021-02-22 DIAGNOSIS — I120 Hypertensive chronic kidney disease with stage 5 chronic kidney disease or end stage renal disease: Secondary | ICD-10-CM | POA: Diagnosis not present

## 2021-02-22 DIAGNOSIS — Z79890 Hormone replacement therapy (postmenopausal): Secondary | ICD-10-CM

## 2021-02-22 DIAGNOSIS — Z6836 Body mass index (BMI) 36.0-36.9, adult: Secondary | ICD-10-CM

## 2021-02-22 DIAGNOSIS — I249 Acute ischemic heart disease, unspecified: Secondary | ICD-10-CM

## 2021-02-22 DIAGNOSIS — Z794 Long term (current) use of insulin: Secondary | ICD-10-CM | POA: Diagnosis not present

## 2021-02-22 DIAGNOSIS — E871 Hypo-osmolality and hyponatremia: Secondary | ICD-10-CM | POA: Diagnosis not present

## 2021-02-22 DIAGNOSIS — M109 Gout, unspecified: Secondary | ICD-10-CM | POA: Diagnosis present

## 2021-02-22 DIAGNOSIS — I5032 Chronic diastolic (congestive) heart failure: Secondary | ICD-10-CM | POA: Diagnosis not present

## 2021-02-22 DIAGNOSIS — I25700 Atherosclerosis of coronary artery bypass graft(s), unspecified, with unstable angina pectoris: Secondary | ICD-10-CM | POA: Diagnosis not present

## 2021-02-22 DIAGNOSIS — J449 Chronic obstructive pulmonary disease, unspecified: Secondary | ICD-10-CM | POA: Diagnosis present

## 2021-02-22 DIAGNOSIS — N2581 Secondary hyperparathyroidism of renal origin: Secondary | ICD-10-CM | POA: Diagnosis not present

## 2021-02-22 DIAGNOSIS — I771 Stricture of artery: Secondary | ICD-10-CM | POA: Diagnosis not present

## 2021-02-22 DIAGNOSIS — D631 Anemia in chronic kidney disease: Secondary | ICD-10-CM | POA: Diagnosis present

## 2021-02-22 DIAGNOSIS — I2510 Atherosclerotic heart disease of native coronary artery without angina pectoris: Secondary | ICD-10-CM | POA: Diagnosis present

## 2021-02-22 DIAGNOSIS — R778 Other specified abnormalities of plasma proteins: Secondary | ICD-10-CM

## 2021-02-22 DIAGNOSIS — K219 Gastro-esophageal reflux disease without esophagitis: Secondary | ICD-10-CM | POA: Diagnosis present

## 2021-02-22 DIAGNOSIS — Y712 Prosthetic and other implants, materials and accessory cardiovascular devices associated with adverse incidents: Secondary | ICD-10-CM | POA: Diagnosis not present

## 2021-02-22 DIAGNOSIS — I25810 Atherosclerosis of coronary artery bypass graft(s) without angina pectoris: Secondary | ICD-10-CM | POA: Diagnosis not present

## 2021-02-22 DIAGNOSIS — Z7902 Long term (current) use of antithrombotics/antiplatelets: Secondary | ICD-10-CM

## 2021-02-22 DIAGNOSIS — Z20822 Contact with and (suspected) exposure to covid-19: Secondary | ICD-10-CM | POA: Diagnosis not present

## 2021-02-22 DIAGNOSIS — N186 End stage renal disease: Secondary | ICD-10-CM

## 2021-02-22 DIAGNOSIS — Z992 Dependence on renal dialysis: Secondary | ICD-10-CM

## 2021-02-22 DIAGNOSIS — Z833 Family history of diabetes mellitus: Secondary | ICD-10-CM

## 2021-02-22 DIAGNOSIS — Z79899 Other long term (current) drug therapy: Secondary | ICD-10-CM

## 2021-02-22 DIAGNOSIS — Z955 Presence of coronary angioplasty implant and graft: Secondary | ICD-10-CM

## 2021-02-22 DIAGNOSIS — Z7982 Long term (current) use of aspirin: Secondary | ICD-10-CM

## 2021-02-22 DIAGNOSIS — I214 Non-ST elevation (NSTEMI) myocardial infarction: Principal | ICD-10-CM | POA: Diagnosis present

## 2021-02-22 DIAGNOSIS — E875 Hyperkalemia: Secondary | ICD-10-CM | POA: Diagnosis not present

## 2021-02-22 DIAGNOSIS — E1151 Type 2 diabetes mellitus with diabetic peripheral angiopathy without gangrene: Secondary | ICD-10-CM | POA: Diagnosis present

## 2021-02-22 DIAGNOSIS — E669 Obesity, unspecified: Secondary | ICD-10-CM | POA: Diagnosis present

## 2021-02-22 DIAGNOSIS — Z9049 Acquired absence of other specified parts of digestive tract: Secondary | ICD-10-CM

## 2021-02-22 DIAGNOSIS — Z87891 Personal history of nicotine dependence: Secondary | ICD-10-CM

## 2021-02-22 DIAGNOSIS — Z880 Allergy status to penicillin: Secondary | ICD-10-CM

## 2021-02-22 DIAGNOSIS — R079 Chest pain, unspecified: Secondary | ICD-10-CM | POA: Diagnosis not present

## 2021-02-22 DIAGNOSIS — I1311 Hypertensive heart and chronic kidney disease without heart failure, with stage 5 chronic kidney disease, or end stage renal disease: Secondary | ICD-10-CM | POA: Diagnosis not present

## 2021-02-22 DIAGNOSIS — T82856A Stenosis of peripheral vascular stent, initial encounter: Secondary | ICD-10-CM | POA: Diagnosis present

## 2021-02-22 DIAGNOSIS — Z8249 Family history of ischemic heart disease and other diseases of the circulatory system: Secondary | ICD-10-CM

## 2021-02-22 LAB — RESP PANEL BY RT-PCR (FLU A&B, COVID) ARPGX2
Influenza A by PCR: NEGATIVE
Influenza B by PCR: NEGATIVE
SARS Coronavirus 2 by RT PCR: NEGATIVE

## 2021-02-22 LAB — TROPONIN I (HIGH SENSITIVITY)
Troponin I (High Sensitivity): 1038 ng/L (ref <18)
Troponin I (High Sensitivity): 1104 ng/L (ref <18)
Troponin I (High Sensitivity): 1226 ng/L (ref <18)
Troponin I (High Sensitivity): 1277 ng/L (ref <18)

## 2021-02-22 LAB — BASIC METABOLIC PANEL
Anion gap: 10 (ref 5–15)
BUN: 66 mg/dL — ABNORMAL HIGH (ref 8–23)
CO2: 27 mmol/L (ref 22–32)
Calcium: 9.2 mg/dL (ref 8.9–10.3)
Chloride: 99 mmol/L (ref 98–111)
Creatinine, Ser: 7.04 mg/dL — ABNORMAL HIGH (ref 0.44–1.00)
GFR, Estimated: 6 mL/min — ABNORMAL LOW (ref >60)
Glucose, Bld: 189 mg/dL — ABNORMAL HIGH (ref 70–99)
Potassium: 5.3 mmol/L — ABNORMAL HIGH (ref 3.5–5.1)
Sodium: 136 mmol/L (ref 135–145)

## 2021-02-22 LAB — CBC
HCT: 34.3 % — ABNORMAL LOW (ref 36.0–46.0)
Hemoglobin: 10.9 g/dL — ABNORMAL LOW (ref 12.0–15.0)
MCH: 33.1 pg (ref 26.0–34.0)
MCHC: 31.8 g/dL (ref 30.0–36.0)
MCV: 104.3 fL — ABNORMAL HIGH (ref 80.0–100.0)
Platelets: 194 10*3/uL (ref 150–400)
RBC: 3.29 MIL/uL — ABNORMAL LOW (ref 3.87–5.11)
RDW: 14 % (ref 11.5–15.5)
WBC: 10.4 10*3/uL (ref 4.0–10.5)
nRBC: 0 % (ref 0.0–0.2)

## 2021-02-22 LAB — HEPARIN LEVEL (UNFRACTIONATED): Heparin Unfractionated: 0.1 IU/mL — ABNORMAL LOW (ref 0.30–0.70)

## 2021-02-22 LAB — CBG MONITORING, ED: Glucose-Capillary: 95 mg/dL (ref 70–99)

## 2021-02-22 MED ORDER — ASPIRIN EC 81 MG PO TBEC: 81.0000 mg | DELAYED_RELEASE_TABLET | Freq: Every day | ORAL | Status: DC

## 2021-02-22 MED ORDER — CLOPIDOGREL BISULFATE 75 MG PO TABS
75.0000 mg | ORAL_TABLET | Freq: Every day | ORAL | Status: DC
  Administered 2021-02-23 – 2021-02-24 (×2): 75 mg via ORAL
  Filled 2021-02-22 (×2): qty 1

## 2021-02-22 MED ORDER — RANOLAZINE ER 500 MG PO TB12
500.0000 mg | ORAL_TABLET | Freq: Every day | ORAL | Status: DC
  Administered 2021-02-22 – 2021-02-25 (×3): 500 mg via ORAL
  Filled 2021-02-22 (×3): qty 1

## 2021-02-22 MED ORDER — ALBUTEROL SULFATE (2.5 MG/3ML) 0.083% IN NEBU: 2.5000 mg | INHALATION_SOLUTION | RESPIRATORY_TRACT | Status: DC | PRN

## 2021-02-22 MED ORDER — NITROGLYCERIN 0.4 MG SL SUBL
0.4000 mg | SUBLINGUAL_TABLET | SUBLINGUAL | Status: DC | PRN
  Administered 2021-02-24: 0.4 mg via SUBLINGUAL
  Filled 2021-02-22: qty 1

## 2021-02-22 MED ORDER — INSULIN ASPART 100 UNIT/ML IJ SOLN
0.0000 [IU] | Freq: Three times a day (TID) | INTRAMUSCULAR | Status: DC
  Administered 2021-02-23: 1 [IU] via SUBCUTANEOUS
  Administered 2021-02-23: 3 [IU] via SUBCUTANEOUS
  Administered 2021-02-24: 1 [IU] via SUBCUTANEOUS
  Administered 2021-02-25: 2 [IU] via SUBCUTANEOUS

## 2021-02-22 MED ORDER — ALPRAZOLAM 0.5 MG PO TABS
0.5000 mg | ORAL_TABLET | Freq: Every day | ORAL | Status: DC
  Administered 2021-02-22 – 2021-02-25 (×2): 0.5 mg via ORAL
  Filled 2021-02-22 (×2): qty 1

## 2021-02-22 MED ORDER — ONDANSETRON HCL 4 MG/2ML IJ SOLN
4.0000 mg | Freq: Four times a day (QID) | INTRAMUSCULAR | Status: DC | PRN
  Filled 2021-02-22: qty 2

## 2021-02-22 MED ORDER — CALCITRIOL 0.5 MCG PO CAPS
1.0000 ug | ORAL_CAPSULE | ORAL | Status: DC
  Administered 2021-02-25: 1 ug via ORAL
  Filled 2021-02-22: qty 2

## 2021-02-22 MED ORDER — ASPIRIN EC 81 MG PO TBEC
81.0000 mg | DELAYED_RELEASE_TABLET | Freq: Every day | ORAL | Status: DC
  Administered 2021-02-23 – 2021-02-25 (×2): 81 mg via ORAL
  Filled 2021-02-22 (×2): qty 1

## 2021-02-22 MED ORDER — NITROGLYCERIN 0.4 MG SL SUBL: 0.4000 mg | SUBLINGUAL_TABLET | SUBLINGUAL | Status: DC | PRN

## 2021-02-22 MED ORDER — VITAMIN D (ERGOCALCIFEROL) 1.25 MG (50000 UNIT) PO CAPS: 50000.0000 [IU] | ORAL_CAPSULE | ORAL | Status: DC

## 2021-02-22 MED ORDER — FERRIC CITRATE 1 GM 210 MG(FE) PO TABS
420.0000 mg | ORAL_TABLET | Freq: Three times a day (TID) | ORAL | Status: DC
  Administered 2021-02-22 – 2021-02-25 (×5): 420 mg via ORAL
  Filled 2021-02-22 (×9): qty 2

## 2021-02-22 MED ORDER — ASPIRIN 81 MG PO CHEW
324.0000 mg | CHEWABLE_TABLET | ORAL | Status: AC
  Administered 2021-02-22: 324 mg via ORAL
  Filled 2021-02-22: qty 4

## 2021-02-22 MED ORDER — ASPIRIN 300 MG RE SUPP: 300.0000 mg | RECTAL | Status: AC

## 2021-02-22 MED ORDER — ALLOPURINOL 100 MG PO TABS
100.0000 mg | ORAL_TABLET | ORAL | Status: DC
  Administered 2021-02-25: 100 mg via ORAL
  Filled 2021-02-22: qty 1

## 2021-02-22 MED ORDER — IPRATROPIUM BROMIDE HFA 17 MCG/ACT IN AERS: 2.0000 | INHALATION_SPRAY | RESPIRATORY_TRACT | Status: DC | PRN

## 2021-02-22 MED ORDER — CHLORHEXIDINE GLUCONATE CLOTH 2 % EX PADS
6.0000 | MEDICATED_PAD | Freq: Every day | CUTANEOUS | Status: DC
  Administered 2021-02-23: 6 via TOPICAL

## 2021-02-22 MED ORDER — CHLORHEXIDINE GLUCONATE CLOTH 2 % EX PADS: 6.0000 | MEDICATED_PAD | Freq: Every day | CUTANEOUS | Status: DC

## 2021-02-22 MED ORDER — ACETAMINOPHEN 325 MG PO TABS
650.0000 mg | ORAL_TABLET | ORAL | Status: DC | PRN
  Administered 2021-02-25: 650 mg via ORAL
  Filled 2021-02-22: qty 2

## 2021-02-22 MED ORDER — GABAPENTIN 400 MG PO CAPS
400.0000 mg | ORAL_CAPSULE | Freq: Two times a day (BID) | ORAL | Status: DC
  Administered 2021-02-22 – 2021-02-25 (×6): 400 mg via ORAL
  Filled 2021-02-22 (×6): qty 1

## 2021-02-22 MED ORDER — HEPARIN (PORCINE) 25000 UT/250ML-% IV SOLN
1400.0000 [IU]/h | INTRAVENOUS | Status: DC
  Administered 2021-02-22: 1000 [IU]/h via INTRAVENOUS
  Administered 2021-02-23: 1100 [IU]/h via INTRAVENOUS
  Administered 2021-02-24: 1400 [IU]/h via INTRAVENOUS
  Filled 2021-02-22 (×3): qty 250

## 2021-02-22 MED ORDER — ALBUTEROL SULFATE 108 (90 BASE) MCG/ACT IN AEPB: 2.0000 | INHALATION_SPRAY | RESPIRATORY_TRACT | Status: DC | PRN

## 2021-02-22 MED ORDER — BRIMONIDINE TARTRATE 0.2 % OP SOLN
1.0000 [drp] | Freq: Three times a day (TID) | OPHTHALMIC | Status: DC
  Filled 2021-02-22 (×2): qty 5

## 2021-02-22 MED ORDER — HEPARIN BOLUS VIA INFUSION
2000.0000 [IU] | Freq: Once | INTRAVENOUS | Status: AC
  Administered 2021-02-23: 2000 [IU] via INTRAVENOUS
  Filled 2021-02-22: qty 2000

## 2021-02-22 MED ORDER — MORPHINE SULFATE (PF) 4 MG/ML IV SOLN
4.0000 mg | Freq: Once | INTRAVENOUS | Status: AC
  Administered 2021-02-22: 4 mg via INTRAVENOUS
  Filled 2021-02-22: qty 1

## 2021-02-22 MED ORDER — ATORVASTATIN CALCIUM 80 MG PO TABS
80.0000 mg | ORAL_TABLET | Freq: Every day | ORAL | Status: DC
  Administered 2021-02-22 – 2021-02-25 (×3): 80 mg via ORAL
  Filled 2021-02-22 (×3): qty 1

## 2021-02-22 MED ORDER — PANTOPRAZOLE SODIUM 40 MG PO TBEC
40.0000 mg | DELAYED_RELEASE_TABLET | Freq: Two times a day (BID) | ORAL | Status: DC
  Administered 2021-02-22 – 2021-02-25 (×6): 40 mg via ORAL
  Filled 2021-02-22 (×6): qty 1

## 2021-02-22 MED ORDER — ALPRAZOLAM 0.25 MG PO TABS
0.2500 mg | ORAL_TABLET | Freq: Two times a day (BID) | ORAL | Status: DC
  Administered 2021-02-22 – 2021-02-25 (×5): 0.25 mg via ORAL
  Filled 2021-02-22 (×5): qty 1

## 2021-02-22 MED ORDER — LEVOTHYROXINE SODIUM 112 MCG PO TABS
112.0000 ug | ORAL_TABLET | Freq: Every day | ORAL | Status: DC
  Administered 2021-02-23 – 2021-02-25 (×3): 112 ug via ORAL
  Filled 2021-02-22 (×3): qty 1

## 2021-02-22 MED ORDER — ONDANSETRON HCL 4 MG/2ML IJ SOLN
4.0000 mg | Freq: Once | INTRAMUSCULAR | Status: AC
  Administered 2021-02-22: 4 mg via INTRAVENOUS
  Filled 2021-02-22: qty 2

## 2021-02-22 MED ORDER — SODIUM ZIRCONIUM CYCLOSILICATE 10 G PO PACK
10.0000 g | PACK | Freq: Every day | ORAL | Status: DC
  Administered 2021-02-22 – 2021-02-23 (×2): 10 g via ORAL
  Filled 2021-02-22: qty 1
  Filled 2021-02-22: qty 2

## 2021-02-22 MED ORDER — IPRATROPIUM-ALBUTEROL 0.5-2.5 (3) MG/3ML IN SOLN: 3.0000 mL | RESPIRATORY_TRACT | Status: DC | PRN

## 2021-02-22 MED ORDER — HEPARIN BOLUS VIA INFUSION
4000.0000 [IU] | Freq: Once | INTRAVENOUS | Status: AC
  Administered 2021-02-22: 4000 [IU] via INTRAVENOUS

## 2021-02-22 NOTE — ED Triage Notes (Signed)
Chest pain onset yesterday, states she took ntg x 3 prior to arrival. Unable to do dialysis treatment due to pain

## 2021-02-22 NOTE — ED Notes (Signed)
Critical Troponin 1277.  Result reported to [place], Therapist, sports at [place].

## 2021-02-22 NOTE — Consult Note (Signed)
[facility]  INPATIENT CONSULTATION  Reason for Consultation: comanagement of ESRD and assoc conditions Requesting Provider: Dr. Francine Graven  HPI: Donna Howe is an 65 y.o. female with ESRD on HD TTS, CAD s/p CABG 2005 and  PCI 12/2020, HTN, HL, PAD, DM who is currently admitted for NSTEMI after presenting with CP and dyspnea and is seen by nephrology for evaluation and management of ESRD.   Presented to HD today for treatment but due to L sided chest pain and dyspnea she was sent to ED for evaluation.  Troponin elevated and she's on a heparin gtt for NSTEMI.  Labs with K 5.3 for which she rec'd lokelma.  RVP negative. CXR clear.   She currently is CP free on arrival to [facility] from [facility].  Cardiology saw her at [facility] prior to transfer. Her ranexa is being increased and clinical course being followed to determine need for LHC.  PMH: Past Medical History:  Diagnosis Date  . Anemia   . Anxiety   . Asthma   . CAD (coronary artery disease)    Multivessel s/p CABG 2005, numerous PCIs since that time and documented graft disease  . Carotid artery disease (HCC)    R CEA  . ESRD on hemodialysis ([place])   . Essential hypertension   . Gout   . History of blood transfusion   . Hyperlipidemia   . Hypothyroidism   . Myocardial infarction ([place])   . PAD (peripheral artery disease) ([place])    Dr. Kellie Simmering  . Pneumonia 09/2019, 11/2019  . S/P angioplasty with stent- DES to [facility] and to [facility] to LAD with DES 04/09/18.   04/10/2018  . SBO (small bowel obstruction) ([place]) 2011   Status post lysis of adhesions & hernia repair  . Sinus bradycardia   . Type 2 diabetes mellitus ([place])   . Umbilical hernia    PSH: Past Surgical History:  Procedure Laterality Date  . AORTIC ARCH ANGIOGRAPHY N/A 08/02/2020   Procedure: AORTIC ARCH ANGIOGRAPHY;  Surgeon: Waynetta Sandy, MD;  Location: [place] [facility];  Service: Cardiovascular;  Laterality: N/A;  Lt upper extermity  . AV FISTULA PLACEMENT Left  06/29/2020   Procedure: LEFT ARM ARTERIOVENOUS (AV) FISTULA;  Surgeon: Waynetta Sandy, MD;  Location: [place];  Service: Vascular;  Laterality: Left;  ARM  . CESAREAN SECTION  1984  . CHOLECYSTECTOMY  2010  . CORONARY ARTERY BYPASS GRAFT  2005  . CORONARY BALLOON ANGIOPLASTY N/A 05/31/2017   Procedure: CORONARY BALLOON ANGIOPLASTY;  Surgeon: Jettie Booze, MD;  Location: [place] [facility];  Service: Cardiovascular;  Laterality: N/A;  . CORONARY STENT INTERVENTION N/A 05/31/2017   Procedure: CORONARY STENT INTERVENTION;  Surgeon: Jettie Booze, MD;  Location: [place] [facility];  Service: Cardiovascular;  Laterality: N/A;  . CORONARY STENT INTERVENTION N/A 04/09/2018   Procedure: CORONARY STENT INTERVENTION;  Surgeon: Jettie Booze, MD;  Location: [place] [facility];  Service: Cardiovascular;  Laterality: N/A;  SVG RCA  . CORONARY STENT INTERVENTION N/A 01/10/2021   Procedure: CORONARY STENT INTERVENTION;  Surgeon: Leonie Man, MD;  Location: [place] [facility];  Service: Cardiovascular;  Laterality: N/A;  . ENDARTERECTOMY Right 04/18/2013   Procedure: ENDARTERECTOMY CAROTID;  Surgeon: Mal Misty, MD;  Location: [place];  Service: Vascular;  Laterality: Right;  . HERNIA REPAIR  1989  . Incisional hernia repair x2  03/04/2010   Laparoscopic with 35cm mesh by Dr Ronnald Collum  . IR FLUORO GUIDE  CV LINE RIGHT  06/21/2020  . IR US GUIDE VASC ACCESS RIGHT  06/21/2020  . LEFT HEART CATH AND CORS/GRAFTS ANGIOGRAPHY N/A 05/31/2017   Procedure: LEFT HEART CATH AND CORS/GRAFTS ANGIOGRAPHY;  Surgeon: Jettie Booze, MD;  Location: [place] [facility];  Service: Cardiovascular;  Laterality: N/A;  . LEFT HEART CATH AND CORS/GRAFTS ANGIOGRAPHY N/A 04/08/2018   Procedure: LEFT HEART CATH AND CORS/GRAFTS ANGIOGRAPHY;  Surgeon: Jettie Booze, MD;  Location: [place] [facility];  Service: Cardiovascular;  Laterality: N/A;  . LEFT HEART CATH AND CORS/GRAFTS ANGIOGRAPHY N/A  06/22/2020   Procedure: LEFT HEART CATH AND CORS/GRAFTS ANGIOGRAPHY;  Surgeon: Belva Crome, MD;  Location: [place] [facility];  Service: Cardiovascular;  Laterality: N/A;  . LEFT HEART CATH AND CORS/GRAFTS ANGIOGRAPHY N/A 01/10/2021   Procedure: LEFT HEART CATH AND CORS/GRAFTS ANGIOGRAPHY;  Surgeon: Leonie Man, MD;  Location: [place] [facility];  Service: Cardiovascular;  Laterality: N/A;  . LEFT HEART CATHETERIZATION WITH CORONARY ANGIOGRAM N/A 12/19/2012   Procedure: LEFT HEART CATHETERIZATION WITH CORONARY ANGIOGRAM;  Surgeon: Josue Hector, MD;  Location: [facility] CATH LAB;  Service: Cardiovascular;  Laterality: N/A;  . LEFT HEART CATHETERIZATION WITH CORONARY/GRAFT ANGIOGRAM N/A 04/19/2013   Procedure: LEFT HEART CATHETERIZATION WITH Beatrix Fetters;  Surgeon: Lorretta Harp, MD;  Location: [facility] CATH LAB;  Service: Cardiovascular;  Laterality: N/A;  . LIGATION OF ARTERIOVENOUS  FISTULA Left 09/15/2020   Procedure: LIGATION OF LEFT ARM ARTERIOVENOUS  FISTULA;  Surgeon: Waynetta Sandy, MD;  Location: [place];  Service: Vascular;  Laterality: Left;  . PATCH ANGIOPLASTY Right 04/18/2013   Procedure: PATCH ANGIOPLASTY Right Internal Carotid Artery;  Surgeon: Mal Misty, MD;  Location: [place];  Service: Vascular;  Laterality: Right;  . PERCUTANEOUS CORONARY STENT INTERVENTION (PCI-S) Right 12/19/2012   Procedure: PERCUTANEOUS CORONARY STENT INTERVENTION (PCI-S);  Surgeon: Josue Hector, MD;  Location: [facility] CATH LAB;  Service: Cardiovascular;  Laterality: Right;  . PERIPHERAL VASCULAR INTERVENTION Left 08/02/2020   Procedure: PERIPHERAL VASCULAR INTERVENTION;  Surgeon: Waynetta Sandy, MD;  Location: [place] [facility];  Service: Cardiovascular;  Laterality: Left;  Left subclavian  . SHOULDER SURGERY      Past Medical History:  Diagnosis Date  . Anemia   . Anxiety   . Asthma   . CAD (coronary artery disease)    Multivessel s/p CABG 2005, numerous PCIs since that time  and documented graft disease  . Carotid artery disease (HCC)    R CEA  . ESRD on hemodialysis ([place])   . Essential hypertension   . Gout   . History of blood transfusion   . Hyperlipidemia   . Hypothyroidism   . Myocardial infarction ([place])   . PAD (peripheral artery disease) ([place])    Dr. Kellie Simmering  . Pneumonia 09/2019, 11/2019  . S/P angioplasty with stent- DES to [facility] and to [facility] to LAD with DES 04/09/18.   04/10/2018  . SBO (small bowel obstruction) ([place]) 2011   Status post lysis of adhesions & hernia repair  . Sinus bradycardia   . Type 2 diabetes mellitus ([place])   . Umbilical hernia     Medications:  I have reviewed the patient's current medications.  Medications Prior to Admission  Medication Sig Dispense Refill  . Albuterol Sulfate 108 (90 Base) MCG/ACT AEPB Inhale 2 puffs into the lungs every 4 (four) hours as needed (Shortness of breath).     Marland Kitchen allopurinol (ZYLOPRIM) 100 MG tablet Take one tablet  after hemodialysis, on Tuesday, Thursday and Saturday. (Patient taking differently: Take 100 mg by mouth Every Tuesday,Thursday,and Saturday with dialysis. After dialysis) 30 tablet 11  . ALPRAZolam (XANAX) 0.5 MG tablet Take 0.25-0.5 mg by mouth See admin instructions. Take 0.25 in the morning and evening and 0.5 mg at bedtime    . aspirin EC 81 MG tablet Take 81 mg by mouth daily.     Marland Kitchen atorvastatin (LIPITOR) 80 MG tablet Take 1 tablet (80 mg total) by mouth every evening. (Patient taking differently: Take 80 mg by mouth at bedtime.)    . brimonidine (ALPHAGAN) 0.2 % ophthalmic solution Place 1 drop into the left eye 3 (three) times daily.    . clopidogrel (PLAVIX) 75 MG tablet Take 1 tablet (75 mg total) by mouth daily. 90 tablet 3  . ferric citrate (AURYXIA) 1 GM 210 MG(Fe) tablet Take 420 mg by mouth 3 (three) times daily with meals.    . gabapentin (NEURONTIN) 400 MG capsule Take 1 capsule (400 mg total) by mouth 2 (two) times daily.    Marland Kitchen ipratropium (ATROVENT HFA) 17 MCG/ACT inhaler  Inhale 2 puffs into the lungs every 4 (four) hours as needed for wheezing.     Marland Kitchen levothyroxine (SYNTHROID) 112 MCG tablet Take 112 mcg by mouth daily before breakfast.    . nitroGLYCERIN (NITROSTAT) 0.4 MG SL tablet Place 0.4 mg under the tongue every 5 (five) minutes x 3 doses as needed for chest pain. Not to exceed 3 in 15 minute time frame    . NOVOLIN 70/30 FLEXPEN (70-30) 100 UNIT/ML KwikPen Inject 24 Units into the skin in the morning and at bedtime.    . ondansetron (ZOFRAN) 8 MG tablet Take 8 mg by mouth every 8 (eight) hours as needed for nausea.    . pantoprazole (PROTONIX) 40 MG tablet Take 1 tablet (40 mg total) by mouth 2 (two) times daily. 60 tablet 1  . ranolazine (RANEXA) 500 MG 12 hr tablet Take 1 tablet (500 mg total) by mouth 2 (two) times daily. (Patient taking differently: Take 500 mg by mouth at bedtime.) 60 tablet 1  . Vitamin D, Ergocalciferol, (DRISDOL) 1.25 MG (50000 UNIT) CAPS capsule Take 50,000 Units by mouth every Sunday.     . diclofenac Sodium (VOLTAREN) 1 % GEL Apply 2 g topically 4 (four) times daily as needed (apply to right foot big toe as needed for pain.). (Patient not taking: Reported on 02/22/2021) 50 g 0    ALLERGIES:   Allergies  Allergen Reactions  . Penicillins Other (See Comments)    REACTION: Unknown, told as a child Has patient had a PCN reaction causing immediate rash, facial/tongue/throat swelling, SOB or lightheadedness with hypotension: Unknown Has patient had a PCN reaction causing severe rash involving mucus membranes or skin necrosis: Unknown Has patient had a PCN reaction that required hospitalization: Unknown Has patient had a PCN reaction occurring within the last 10 years: No If all of the above answers are "NO", then may proceed with Cephalosporin use.   . Beta Adrenergic Blockers Other (See Comments)    Hypotension    FAM HX: Family History  Problem Relation Age of Onset  . Diabetes Mother   . Heart disease Mother        before  age 68  . Hyperlipidemia Mother   . Hypertension Mother   . Thyroid disease Father   . Hypertension Father   . AAA (abdominal aortic aneurysm) Father   . Heart disease Brother  before age 49  . Hypertension Brother   . Hyperlipidemia Son   . Hypertension Son     Social History:   reports that she quit smoking about 8 years ago. Her smoking use included cigarettes. She has a 20.00 pack-year smoking history. She has never used smokeless tobacco. She reports that she does not drink alcohol and does not use drugs.  ROS: 12 system ROS neg except per HPI above  Blood pressure (!) 157/55, pulse 75, temperature 97.7 F (36.5 C), temperature source Oral, resp. rate 18, height 5\' 5"  (1.651 m), weight 95.3 kg, SpO2 90 %. PHYSICAL EXAM: Gen: chronically ill but in no distress  Eyes: wearing protective glasses ENT: MMM Neck: supple, healed CEA scar R CV: RRR, no rub Abd:  Soft, obese Lungs: clear, normal WOB GU: no foley Extr:  No edema Neuro: conversant and nonfocal Skin: cool and dry HD access:  RIJ TDC c/d/i   Results for orders placed or performed during the hospital encounter of 02/22/21 (from the past 48 hour(s))  Resp Panel by RT-PCR (Flu A&B, Covid) Nasopharyngeal Swab     Status: None   Collection Time: 02/22/21 12:04 PM   Specimen: Nasopharyngeal Swab; Nasopharyngeal(NP) swabs in vial transport medium  Result Value Ref Range   SARS Coronavirus 2 by RT PCR NEGATIVE NEGATIVE    Comment: (NOTE) SARS-CoV-2 target nucleic acids are NOT DETECTED.  The SARS-CoV-2 RNA is generally detectable in upper respiratory specimens during the acute phase of infection. The lowest concentration of SARS-CoV-2 viral copies this assay can detect is 138 copies/mL. A negative result does not preclude SARS-Cov-2 infection and should not be used as the sole basis for treatment or other patient management decisions. A negative result may occur with  improper specimen collection/handling,  submission of specimen other than nasopharyngeal swab, presence of viral mutation(s) within the areas targeted by this assay, and inadequate number of viral copies(<138 copies/mL). A negative result must be combined with clinical observations, patient history, and epidemiological information. The expected result is Negative.  Fact Sheet for Patients:  EntrepreneurPulse.com.au  Fact Sheet for Healthcare Providers:  IncredibleEmployment.be  This test is no t yet approved or cleared by the [place] FDA and  has been authorized for detection and/or diagnosis of SARS-CoV-2 by FDA under an Emergency Use Authorization (EUA). This EUA will remain  in effect (meaning this test can be used) for the duration of the COVID-19 declaration under Section 564(b)(1) of the Act, 21 U.S.C.section 360bbb-3(b)(1), unless the authorization is terminated  or revoked sooner.       Influenza A by PCR NEGATIVE NEGATIVE   Influenza B by PCR NEGATIVE NEGATIVE    Comment: (NOTE) The Xpert Xpress SARS-CoV-2/FLU/RSV plus assay is intended as an aid in the diagnosis of influenza from Nasopharyngeal swab specimens and should not be used as a sole basis for treatment. Nasal washings and aspirates are unacceptable for Xpert Xpress SARS-CoV-2/FLU/RSV testing.  Fact Sheet for Patients: EntrepreneurPulse.com.au  Fact Sheet for Healthcare Providers: IncredibleEmployment.be  This test is not yet approved or cleared by the [place] FDA and has been authorized for detection and/or diagnosis of SARS-CoV-2 by FDA under an Emergency Use Authorization (EUA). This EUA will remain in effect (meaning this test can be used) for the duration of the COVID-19 declaration under Section 564(b)(1) of the Act, 21 U.S.C. section 360bbb-3(b)(1), unless the authorization is terminated or revoked.  Performed at [facility], [address],  [place], [place] [address]   Basic  metabolic panel     Status: Abnormal   Collection Time: 02/22/21 12:44 PM  Result Value Ref Range   Sodium 136 135 - 145 mmol/L   Potassium 5.3 (H) 3.5 - 5.1 mmol/L   Chloride 99 98 - 111 mmol/L   CO2 27 22 - 32 mmol/L   Glucose, Bld 189 (H) 70 - 99 mg/dL    Comment: Glucose reference range applies only to samples taken after fasting for at least 8 hours.   BUN 66 (H) 8 - 23 mg/dL   Creatinine, Ser 7.04 (H) 0.44 - 1.00 mg/dL   Calcium 9.2 8.9 - 10.3 mg/dL   GFR, Estimated 6 (L) >60 mL/min    Comment: (NOTE) Calculated using the CKD-EPI Creatinine Equation (2021)    Anion gap 10 5 - 15    Comment: Performed at [facility], [address], [place], [place] [address]  CBC     Status: Abnormal   Collection Time: 02/22/21 12:44 PM  Result Value Ref Range   WBC 10.4 4.0 - 10.5 K/uL   RBC 3.29 (L) 3.87 - 5.11 MIL/uL   Hemoglobin 10.9 (L) 12.0 - 15.0 g/dL   HCT 34.3 (L) 36.0 - 46.0 %   MCV 104.3 (H) 80.0 - 100.0 fL   MCH 33.1 26.0 - 34.0 pg   MCHC 31.8 30.0 - 36.0 g/dL   RDW 14.0 11.5 - 15.5 %   Platelets 194 150 - 400 K/uL   nRBC 0.0 0.0 - 0.2 %    Comment: Performed at [facility], [address], [place], [place] [address]  Troponin I (High Sensitivity)     Status: Abnormal   Collection Time: 02/22/21 12:44 PM  Result Value Ref Range   Troponin I (High Sensitivity) 1,038 (HH) <18 ng/L    Comment: CRITICAL RESULT CALLED TO, READ BACK BY AND VERIFIED WITH: HEATHER CRAWFORD @1344  02/22/21 BY JONES,T (NOTE) Elevated high sensitivity troponin I (hsTnI) values and significant  changes across serial measurements may suggest ACS but many other  chronic and acute conditions are known to elevate hsTnI results.  Refer to the Links section for chest pain algorithms and additional  guidance. Performed at [facility], [address] [place], [place] [address]   Troponin I (High Sensitivity)     Status: Abnormal   Collection Time: 02/22/21  3:03 PM  Result  Value Ref Range   Troponin I (High Sensitivity) 1,104 (HH) <18 ng/L    Comment: CRITICAL RESULT CALLED TO, READ BACK BY AND VERIFIED WITH: LONG,L ON 02/22/21 AT 1610 BY LOY,C DELTA CHECK NOTED (NOTE) Elevated high sensitivity troponin I (hsTnI) values and significant  changes across serial measurements may suggest ACS but many other  chronic and acute conditions are known to elevate hsTnI results.  Refer to the Links section for chest pain algorithms and additional  guidance. Performed at [facility], [address], [place], [place] [address]   CBG monitoring, ED     Status: None   Collection Time: 02/22/21  5:23 PM  Result Value Ref Range   Glucose-Capillary 95 70 - 99 mg/dL    Comment: Glucose reference range applies only to samples taken after fasting for at least 8 hours.  Troponin I (High Sensitivity)     Status: Abnormal   Collection Time: 02/22/21  5:53 PM  Result Value Ref Range   Troponin I (High Sensitivity) 1,226 (HH) <18 ng/L    Comment: DELTA CHECK NOTED CRITICAL RESULT CALLED TO, READ BACK BY AND VERIFIED  WITH: NICHOLS,K ON 02/22/21 AT 1900 BY LOY,C (NOTE) Elevated high sensitivity troponin I (hsTnI) values and significant  changes across serial measurements may suggest ACS but many other  chronic and acute conditions are known to elevate hsTnI results.  Refer to the Links section for chest pain algorithms and additional  guidance. Performed at [facility], [address], [place], [place] [address]   Troponin I (High Sensitivity)     Status: Abnormal   Collection Time: 02/22/21  7:55 PM  Result Value Ref Range   Troponin I (High Sensitivity) 1,277 (HH) <18 ng/L    Comment: DELTA CHECK NOTED CRITICAL RESULT CALLED TO, READ BACK BY AND VERIFIED WITH: LAMBERT,S ON 02/22/21 AT 2100 BY LOY,C (NOTE) Elevated high sensitivity troponin I (hsTnI) values and significant  changes across serial measurements may suggest ACS but many other  chronic and acute conditions are  known to elevate hsTnI results.  Refer to the Links section for chest pain algorithms and additional  guidance. Performed at [facility], [address], [place], [place] [address]     DG Chest Port 1 View  Result Date: 02/22/2021 CLINICAL DATA:  Chest pain EXAM: PORTABLE CHEST 1 VIEW COMPARISON:  01/09/2021 FINDINGS: Right tunneled dialysis catheter is unchanged. Vascular stent is again noted along the aortic arch. Coronary artery stent also present. Postoperative changes of CABG. No new consolidation or edema. No pleural effusion or pneumothorax. Stable cardiomediastinal contours. IMPRESSION: No acute process in the chest. Electronically Signed   By: Macy Mis M.D.   On: 02/22/2021 13:24   Outpt HD orders: [facility] TTS 2nd shift 180 NR, 400/500, EDW 92.5, 4hrs, 2K/2.5Ca Calcitriol 1 TIW 6/2 PTH 129, 01/2021 phos 6.5, Hb 12 Recent post weights 92.3kg - 94.4kg.  Assessment/Plan **NSTEMI on known CAD: on heparin gtt, cardiology to follow.    **ESRD on HD:  Missed HD in setting of NSTEMI, will plan for 1st shift HD tomorrow per usual settings.   **Hyperkalemia: mild, given lokelma, plan 2K tomorrow but check labs in AM  **Anemia:  Hb 10.9, not on outpt ESA.    **Secondary hyperparathyroidism: cont TIW calcitriol and auryxia.   **DM: insulin per primary.  Justin Mend 02/22/2021, 9:07 PM

## 2021-02-22 NOTE — Consult Note (Signed)
Cardiology Consultation:   Patient ID: Donna Howe MRN: [id number]; DOB: 06-Mar-1956  Admit date: 02/22/2021 Date of Consult: 02/22/2021  PCP:  Curlene Labrum, MD   [facility] Providers Cardiologist:  Rozann Lesches, MD        Patient Profile:   Donna Howe is a 65 y.o. female with a hx of ASHD and PVD, DM on insulin, Hypertension  on chronic dialysis who is being seen 02/22/2021 for the evaluation of L chest discomfort that clinically sounds like unstable increasing angina at the request of ER physician.  History of Present Illness:   Donna Howe is a 65 year old woman with known ischemic heart disease who underwent stent revascularization in April of this year.  Despite this procedure she has continued to have left breast discomfort radiating to her left shoulder left arm occurring both at rest but also with activity especially after eating a meal.  Initially she felt this was possibly GI but it has persisted despite taking an acids and Protonix.  1-2 nitroglycerin usually relieve her symptoms promptly but today took 3.  Normally this spells last less than 5 to 10 minutes in duration.  She does not have palpitations, dizziness.  She says her blood pressures have been reasonable although sometimes they dip during dialysis and the dialysis treatment has to be held.  Her blood sugars have generally been acceptable but occasionally if she forgets to take her insulin they will go quite high.  She has not had significant edema.  No orthopnea PND or major congestive symptoms.  No palpitations dizziness or presyncope. She was scheduled for dialysis this morning but felt she came to the emergency room.   Past Medical History:  Diagnosis Date  . Anemia   . Anxiety   . Asthma   . CAD (coronary artery disease)    Multivessel s/p CABG 2005, numerous PCIs since that time and documented graft disease  . Carotid artery disease (HCC)    R CEA  . ESRD on hemodialysis ([place])   . Essential  hypertension   . Gout   . History of blood transfusion   . Hyperlipidemia   . Hypothyroidism   . Myocardial infarction ([place])   . PAD (peripheral artery disease) ([place])    Dr. Kellie Simmering  . Pneumonia 09/2019, 11/2019  . S/P angioplasty with stent- DES to [facility] and to LIMA to LAD with DES 04/09/18.   04/10/2018  . SBO (small bowel obstruction) ([place]) 2011   Status post lysis of adhesions & hernia repair  . Sinus bradycardia   . Type 2 diabetes mellitus ([place])   . Umbilical hernia     Past Surgical History:  Procedure Laterality Date  . AORTIC ARCH ANGIOGRAPHY N/A 08/02/2020   Procedure: AORTIC ARCH ANGIOGRAPHY;  Surgeon: Waynetta Sandy, MD;  Location: [place] [facility];  Service: Cardiovascular;  Laterality: N/A;  Lt upper extermity  . AV FISTULA PLACEMENT Left 06/29/2020   Procedure: LEFT ARM ARTERIOVENOUS (AV) FISTULA;  Surgeon: Waynetta Sandy, MD;  Location: [place];  Service: Vascular;  Laterality: Left;  ARM  . CESAREAN SECTION  1984  . CHOLECYSTECTOMY  2010  . CORONARY ARTERY BYPASS GRAFT  2005  . CORONARY BALLOON ANGIOPLASTY N/A 05/31/2017   Procedure: CORONARY BALLOON ANGIOPLASTY;  Surgeon: Jettie Booze, MD;  Location: [place] [facility];  Service: Cardiovascular;  Laterality: N/A;  . CORONARY STENT INTERVENTION N/A 05/31/2017   Procedure: CORONARY STENT INTERVENTION;  Surgeon: Jettie Booze, MD;  Location:  [place] [facility];  Service: Cardiovascular;  Laterality: N/A;  . CORONARY STENT INTERVENTION N/A 04/09/2018   Procedure: CORONARY STENT INTERVENTION;  Surgeon: Jettie Booze, MD;  Location: [place] [facility];  Service: Cardiovascular;  Laterality: N/A;  SVG RCA  . CORONARY STENT INTERVENTION N/A 01/10/2021   Procedure: CORONARY STENT INTERVENTION;  Surgeon: Leonie Man, MD;  Location: [place] [facility];  Service: Cardiovascular;  Laterality: N/A;  . ENDARTERECTOMY Right 04/18/2013   Procedure: ENDARTERECTOMY CAROTID;  Surgeon: Mal Misty, MD;  Location: [place];  Service: Vascular;  Laterality: Right;  . HERNIA REPAIR  1989  . Incisional hernia repair x2  03/04/2010   Laparoscopic with 35cm mesh by Dr Ronnald Collum  . IR FLUORO GUIDE CV LINE RIGHT  06/21/2020  . IR US GUIDE VASC ACCESS RIGHT  06/21/2020  . LEFT HEART CATH AND CORS/GRAFTS ANGIOGRAPHY N/A 05/31/2017   Procedure: LEFT HEART CATH AND CORS/GRAFTS ANGIOGRAPHY;  Surgeon: Jettie Booze, MD;  Location: [place] [facility];  Service: Cardiovascular;  Laterality: N/A;  . LEFT HEART CATH AND CORS/GRAFTS ANGIOGRAPHY N/A 04/08/2018   Procedure: LEFT HEART CATH AND CORS/GRAFTS ANGIOGRAPHY;  Surgeon: Jettie Booze, MD;  Location: [place] [facility];  Service: Cardiovascular;  Laterality: N/A;  . LEFT HEART CATH AND CORS/GRAFTS ANGIOGRAPHY N/A 06/22/2020   Procedure: LEFT HEART CATH AND CORS/GRAFTS ANGIOGRAPHY;  Surgeon: Belva Crome, MD;  Location: [place] [facility];  Service: Cardiovascular;  Laterality: N/A;  . LEFT HEART CATH AND CORS/GRAFTS ANGIOGRAPHY N/A 01/10/2021   Procedure: LEFT HEART CATH AND CORS/GRAFTS ANGIOGRAPHY;  Surgeon: Leonie Man, MD;  Location: [place] [facility];  Service: Cardiovascular;  Laterality: N/A;  . LEFT HEART CATHETERIZATION WITH CORONARY ANGIOGRAM N/A 12/19/2012   Procedure: LEFT HEART CATHETERIZATION WITH CORONARY ANGIOGRAM;  Surgeon: Josue Hector, MD;  Location: [facility] CATH LAB;  Service: Cardiovascular;  Laterality: N/A;  . LEFT HEART CATHETERIZATION WITH CORONARY/GRAFT ANGIOGRAM N/A 04/19/2013   Procedure: LEFT HEART CATHETERIZATION WITH Beatrix Fetters;  Surgeon: Lorretta Harp, MD;  Location: [facility] CATH LAB;  Service: Cardiovascular;  Laterality: N/A;  . LIGATION OF ARTERIOVENOUS  FISTULA Left 09/15/2020   Procedure: LIGATION OF LEFT ARM ARTERIOVENOUS  FISTULA;  Surgeon: Waynetta Sandy, MD;  Location: [place];  Service: Vascular;  Laterality: Left;  . PATCH ANGIOPLASTY Right 04/18/2013   Procedure: PATCH  ANGIOPLASTY Right Internal Carotid Artery;  Surgeon: Mal Misty, MD;  Location: [place];  Service: Vascular;  Laterality: Right;  . PERCUTANEOUS CORONARY STENT INTERVENTION (PCI-S) Right 12/19/2012   Procedure: PERCUTANEOUS CORONARY STENT INTERVENTION (PCI-S);  Surgeon: Josue Hector, MD;  Location: [facility] CATH LAB;  Service: Cardiovascular;  Laterality: Right;  . PERIPHERAL VASCULAR INTERVENTION Left 08/02/2020   Procedure: PERIPHERAL VASCULAR INTERVENTION;  Surgeon: Waynetta Sandy, MD;  Location: [place] [facility];  Service: Cardiovascular;  Laterality: Left;  Left subclavian  . SHOULDER SURGERY         Inpatient Medications: Scheduled Meds:  Continuous Infusions: . heparin 1,000 Units/hr (02/22/21 1451)   PRN Meds:   Allergies:    Allergies  Allergen Reactions  . Penicillins Other (See Comments)    REACTION: Unknown, told as a child Has patient had a PCN reaction causing immediate rash, facial/tongue/throat swelling, SOB or lightheadedness with hypotension: Unknown Has patient had a PCN reaction causing severe rash involving mucus membranes or skin necrosis: Unknown Has patient had a PCN reaction that required hospitalization: Unknown Has patient  had a PCN reaction occurring within the last 10 years: No If all of the above answers are "NO", then may proceed with Cephalosporin use.   . Beta Adrenergic Blockers Other (See Comments)    Hypotension    Social History:   Social History   Socioeconomic History  . Marital status: Divorced    Spouse name: Not on file  . Number of children: Not on file  . Years of education: Not on file  . Highest education level: Not on file  Occupational History  . Occupation: Disabled  Tobacco Use  . Smoking status: Former Smoker    Packs/day: 1.00    Years: 20.00    Pack years: 20.00    Types: Cigarettes    Quit date: 12/10/2012    Years since quitting: 8.2  . Smokeless tobacco: Never Used  Vaping Use  . Vaping Use: Never  used  Substance and Sexual Activity  . Alcohol use: No    Alcohol/week: 0.0 standard drinks  . Drug use: No  . Sexual activity: Not Currently  Other Topics Concern  . Not on file  Social History Narrative   Lives with mother.   Social Determinants of Health   Financial Resource Strain: Not on file  Food Insecurity: Not on file  Transportation Needs: Not on file  Physical Activity: Not on file  Stress: Not on file  Social Connections: Not on file  Intimate Partner Violence: Not on file    Family History:    Family History  Problem Relation Age of Onset  . Diabetes Mother   . Heart disease Mother        before age 9  . Hyperlipidemia Mother   . Hypertension Mother   . Thyroid disease Father   . Hypertension Father   . AAA (abdominal aortic aneurysm) Father   . Heart disease Brother        before age 11  . Hypertension Brother   . Hyperlipidemia Son   . Hypertension Son      ROS:  Please see the history of present illness.  History of previous left carotid endarterectomy, remote No major pulmonary complaints. GERD on therapy.  See HPI) Still urinates a fair amount.  Is supposed to limit her intake to 40 ounces of fluid daily History of gout particularly in her left ankle.  Has not occurred since being on allopurinol No skin rashes All other ROS reviewed and negative.     Physical Exam/Data:   Vitals:   02/22/21 1344 02/22/21 1400 02/22/21 1500 02/22/21 1530  BP:  (!) 123/49 (!) 155/51 (!) 143/61  Pulse:  70 69 70  Resp:  (!) 22 20 16   Temp:      TempSrc:      SpO2:  100% 98% 95%  Weight: 95.3 kg     Height: 5\' 5"  (1.651 m)      No intake or output data in the 24 hours ending 02/22/21 1620 Last 3 Weights 02/22/2021 02/04/2021 01/28/2021  Weight (lbs) 210 lb 204 lb 207 lb 3.2 oz  Weight (kg) 95.255 kg 92.534 kg 93.985 kg     Body mass index is 34.95 kg/m.  General:  Well nourished, well developed, in no acute distress, awake and alert,  communicative HEENT: Wearing sunglasses inside (light sensitivity) Lymph: no adenopathy Neck: no JVD, no bruits.  Right carotid endarterectomy scar Endocrine:  No thryomegaly Vascular: No carotid bruits; FA pulses 2+ bilaterally without bruits  Cardiac:  normal S1, S2; RRR;  no murmur  Lungs:  clear to auscultation bilaterally, no wheezing, rhonchi or rales  Abd: soft, nontender, no hepatomegaly  Ext: Mild puffiness in her feet.  No pitting edema.  Palpable popliteal femoral pulses but could not feel pedal pulses.  Her feet were warm however Musculoskeletal:  No deformities, BUE and BLE strength normal and equal Skin: warm and dry  Neuro:  , no focal abnormalities noted Psych:  Normal affect   EKG:  The EKG was personally reviewed and demonstrates: Normal sinus rhythm.  Intraventricular conduction disturbance with diffuse ST-T changes.  No significant change since previous EKG. Telemetry:  Telemetry was personally reviewed and demonstrates:  NSR  Relevant CV Studies: Cardiac Catheterization: 12/2020  There is mild left ventricular systolic dysfunction. The left ventricular ejection fraction is 50-55% by visual estimate.  LV end diastolic pressure is moderately elevated.  There is no aortic valve stenosis.  -------------------------------------------  Ost LM to Dist LM STENT is 95% stenosed. Ost Cx to Mid Cx STENT is 99% stenosed.  JAILED 1st Mrg-1 lesion is 99% stenosed. 1st Mrg-2 lesion is 100% stenosed.  Ost LAD to Prox LAD lesion is 100% stenosed.  Prox RCA lesion is 100% stenosed.  LIMA graft was visualized by non-selective angiography and is normal in caliber. Origin to Prox Graft STENT is 60% stenosed.  SeqSVG OM-LPL graft was not visualized due to known occlusion. Origin to Prox Graft lesion before 1st Mrg is 100% stenosed.  SVG-mRCA graft was visualized by angiography. Origin STENT is 20% stenosed.  Prox SVG-RCA Graft lesion is 75% stenosed.  A drug-eluting stent  was successfully placed (overlapping previous stent) using a SYNERGY XD [id number]. Postdilated to 2.75 mm.  Post intervention, there is a 0% residual stenosis.  Mid RCA to Dist RCA STENT (placed through SVG) is 30% stenosed.  SUMMARY  Likely culprit lesion-75% ulcerated lesion in SVG-RCA -> ? s/p successful DES PCI overlapping ostial stent with Synergy DES 2.5 mg 60 mm postdilated to 2.75 mm.  Severe native CAD with occluded ostial LAD, 95% ostial OM1 with extensive stent ISR in the LM-LCx with least 95% stenosis RCA CTO.  Patent LIMA-LAD-but unable to engage the left subclavian artery due to stent extending into the aorta. Nonselective imaging reveals TIMI-3 flow in the LIMA graft, there does appear to be some in-stent restenosis at least 50% in the ostial stent.  Known occlusion of the SVG-OM-LPL,   Severe Systemic Hypertension with moderate to severely elevated LVEDP 22 mmHg   Laboratory Data:  High Sensitivity Troponin:   Recent Labs  Lab 02/22/21 1244 02/22/21 1503  TROPONINIHS 1,038* 1,104*  Troponins are chronically elevated but this is higher than previously Chemistry Recent Labs  Lab 02/22/21 1244  NA 136  K 5.3*  CL 99  CO2 27  GLUCOSE 189*  BUN 66*  CREATININE 7.04*  CALCIUM 9.2  GFRNONAA 6*  ANIONGAP 10    No results for input(s): PROT, ALBUMIN, AST, ALT, ALKPHOS, BILITOT in the last 168 hours. Hematology Recent Labs  Lab 02/22/21 1244  WBC 10.4  RBC 3.29*  HGB 10.9*  HCT 34.3*  MCV 104.3*  MCH 33.1  MCHC 31.8  RDW 14.0  PLT 194   BNPNo results for input(s): BNP, PROBNP in the last 168 hours.  DDimer No results for input(s): DDIMER in the last 168 hours.   1.ASHD p CABG 2005 w multiple stents w graft stenosis and in-stent restenosis noted. Now increasing angina 2. Essential hypertension   3. Hyperlipidemia LDL goal <  70   4. Bilateral carotid artery stenosis   5. ESRD on hemodialysis ([place])   6. DM on Insulin 7. Hyperuricemia w Hx of  gout 8. Obesity  P: Patient is apparently being transferred to [facility] hospital.  Would start on intravenous heparin infusion.  I would personally use nitroglycerin paste at least 1 inch every 6 hours which if this another changes to control her symptoms switch to an long-acting nitroglycerin patch.  I noticed that she is taking her Ranexa only 500 mg daily.  I would increase this to 500 mg twice daily.( max dose for dialysis patients). Patient not on B Blockers or Ca entry blockers- consider starting and observe BP response on dialysis and increase as tolerated. Decision re re cath dependant on clinical course.     For questions or updates, please contact [place] Please consult [email or web address] for contact info under    Signed, Abel Presto, MD  02/22/2021 4:20 PM

## 2021-02-22 NOTE — H&P (Signed)
History and Physical    [name] KGM:[id number] DOB: 1955/11/29 DOA: 02/22/2021  PCP: Curlene Labrum, MD   Patient coming from: Home  I have personally briefly reviewed patient's old medical records in [place]  Chief Complaint: Chest pain  HPI: Donna Howe is a 65 y.o. female with medical history significant for coronary artery disease status post PCI with DES x2 to the [facility], history of end-stage renal disease on hemodialysis(T/TH/S), history of diabetes mellitus, morbid obesity and hypertension who presents to the emergency room from the dialysis center for evaluation of chest pain which she has had for the last 24 hours.  Chest pain started at rest, mostly over her left breast and was rated 8 x 10 in intensity at its worst with radiation to her left arm.  It was associated with shortness of breath and diaphoresis but she denies having any nausea or vomiting.  She denies feeling dizzy or lightheaded. She took 3 sublingual nitroglycerin pills prior to arriving to the emergency room with some improvement in her pain. She denies having any fever, no cough, no abdominal pain, no changes in her bowel habits, no urinary symptoms, no dizziness, no lightheadedness, no palpitations, no headache, no leg swelling, no blurred vision no focal deficit. Labs show sodium 136, potassium 5.3, chloride 99, bicarb 27, glucose 189, BUN 66, creatinine 7.04, calcium 9.2, troponin 1038 >> 1104, white count 10.4, hemoglobin 10.9, hematocrit 34.3, MCV 104.3, RDW 14.0, platelet count 194 Respiratory viral panel is negative Chest x-ray reviewed by me shows no acute cardiopulmonary disease. Twelve-lead EKG reviewed by me shows sinus rhythm with diffuse T wave inversions.   ED Course: Patient is a 65 year old female with a history of coronary artery disease status post CABG, status post recent PCI with DES x2 to the [facility] who presents to the emergency room for evaluation of chest pain which started  at rest associated with diaphoresis, nausea and radiation to the left arm. Chest pain resolved following administration of nitroglycerin and patient is currently chest pain-free. She bumped her troponin and will be admitted to the hospital for possible non-ST elevation MI. She has a history of end-stage renal disease on her dialysis days are T/TH/S.  Patient missed her renal replacement treatment today 02/22/21 but does not show any signs of fluid overload.  She has mild hyperkalemia which should be treated.   Review of Systems: As per HPI otherwise all other systems reviewed and negative.    Past Medical History:  Diagnosis Date  . Anemia   . Anxiety   . Asthma   . CAD (coronary artery disease)    Multivessel s/p CABG 2005, numerous PCIs since that time and documented graft disease  . Carotid artery disease (HCC)    R CEA  . ESRD on hemodialysis ([place])   . Essential hypertension   . Gout   . History of blood transfusion   . Hyperlipidemia   . Hypothyroidism   . Myocardial infarction ([place])   . PAD (peripheral artery disease) ([place])    Dr. Kellie Simmering  . Pneumonia 09/2019, 11/2019  . S/P angioplasty with stent- DES to [facility] and to [facility] to LAD with DES 04/09/18.   04/10/2018  . SBO (small bowel obstruction) ([place]) 2011   Status post lysis of adhesions & hernia repair  . Sinus bradycardia   . Type 2 diabetes mellitus ([place])   . Umbilical hernia     Past Surgical History:  Procedure Laterality Date  . AORTIC  ARCH ANGIOGRAPHY N/A 08/02/2020   Procedure: AORTIC ARCH ANGIOGRAPHY;  Surgeon: Waynetta Sandy, MD;  Location: [place] CV [facility];  Service: Cardiovascular;  Laterality: N/A;  Lt upper extermity  . AV FISTULA PLACEMENT Left 06/29/2020   Procedure: LEFT ARM ARTERIOVENOUS (AV) FISTULA;  Surgeon: Waynetta Sandy, MD;  Location: [place];  Service: Vascular;  Laterality: Left;  ARM  . CESAREAN SECTION  1984  . CHOLECYSTECTOMY  2010  . CORONARY ARTERY BYPASS GRAFT  2005  .  CORONARY BALLOON ANGIOPLASTY N/A 05/31/2017   Procedure: CORONARY BALLOON ANGIOPLASTY;  Surgeon: Jettie Booze, MD;  Location: [place] [facility];  Service: Cardiovascular;  Laterality: N/A;  . CORONARY STENT INTERVENTION N/A 05/31/2017   Procedure: CORONARY STENT INTERVENTION;  Surgeon: Jettie Booze, MD;  Location: [place] [facility];  Service: Cardiovascular;  Laterality: N/A;  . CORONARY STENT INTERVENTION N/A 04/09/2018   Procedure: CORONARY STENT INTERVENTION;  Surgeon: Jettie Booze, MD;  Location: [place] [facility];  Service: Cardiovascular;  Laterality: N/A;  SVG RCA  . CORONARY STENT INTERVENTION N/A 01/10/2021   Procedure: CORONARY STENT INTERVENTION;  Surgeon: Leonie Man, MD;  Location: [place] [facility];  Service: Cardiovascular;  Laterality: N/A;  . ENDARTERECTOMY Right 04/18/2013   Procedure: ENDARTERECTOMY CAROTID;  Surgeon: Mal Misty, MD;  Location: [place];  Service: Vascular;  Laterality: Right;  . HERNIA REPAIR  1989  . Incisional hernia repair x2  03/04/2010   Laparoscopic with 35cm mesh by Dr Ronnald Collum  . IR FLUORO GUIDE CV LINE RIGHT  06/21/2020  . IR US GUIDE VASC ACCESS RIGHT  06/21/2020  . LEFT HEART CATH AND CORS/GRAFTS ANGIOGRAPHY N/A 05/31/2017   Procedure: LEFT HEART CATH AND CORS/GRAFTS ANGIOGRAPHY;  Surgeon: Jettie Booze, MD;  Location: [place] [facility];  Service: Cardiovascular;  Laterality: N/A;  . LEFT HEART CATH AND CORS/GRAFTS ANGIOGRAPHY N/A 04/08/2018   Procedure: LEFT HEART CATH AND CORS/GRAFTS ANGIOGRAPHY;  Surgeon: Jettie Booze, MD;  Location: [place] [facility];  Service: Cardiovascular;  Laterality: N/A;  . LEFT HEART CATH AND CORS/GRAFTS ANGIOGRAPHY N/A 06/22/2020   Procedure: LEFT HEART CATH AND CORS/GRAFTS ANGIOGRAPHY;  Surgeon: Belva Crome, MD;  Location: [place] [facility];  Service: Cardiovascular;  Laterality: N/A;  . LEFT HEART CATH AND CORS/GRAFTS ANGIOGRAPHY N/A 01/10/2021   Procedure: LEFT HEART  CATH AND CORS/GRAFTS ANGIOGRAPHY;  Surgeon: Leonie Man, MD;  Location: [place] [facility];  Service: Cardiovascular;  Laterality: N/A;  . LEFT HEART CATHETERIZATION WITH CORONARY ANGIOGRAM N/A 12/19/2012   Procedure: LEFT HEART CATHETERIZATION WITH CORONARY ANGIOGRAM;  Surgeon: Josue Hector, MD;  Location: [facility] CATH LAB;  Service: Cardiovascular;  Laterality: N/A;  . LEFT HEART CATHETERIZATION WITH CORONARY/GRAFT ANGIOGRAM N/A 04/19/2013   Procedure: LEFT HEART CATHETERIZATION WITH Beatrix Fetters;  Surgeon: Lorretta Harp, MD;  Location: [facility] CATH LAB;  Service: Cardiovascular;  Laterality: N/A;  . LIGATION OF ARTERIOVENOUS  FISTULA Left 09/15/2020   Procedure: LIGATION OF LEFT ARM ARTERIOVENOUS  FISTULA;  Surgeon: Waynetta Sandy, MD;  Location: [place];  Service: Vascular;  Laterality: Left;  . PATCH ANGIOPLASTY Right 04/18/2013   Procedure: PATCH ANGIOPLASTY Right Internal Carotid Artery;  Surgeon: Mal Misty, MD;  Location: [place];  Service: Vascular;  Laterality: Right;  . PERCUTANEOUS CORONARY STENT INTERVENTION (PCI-S) Right 12/19/2012   Procedure: PERCUTANEOUS CORONARY STENT INTERVENTION (PCI-S);  Surgeon: Josue Hector, MD;  Location: [facility] CATH LAB;  Service: Cardiovascular;  Laterality: Right;  . PERIPHERAL VASCULAR INTERVENTION Left 08/02/2020   Procedure: PERIPHERAL VASCULAR INTERVENTION;  Surgeon: Waynetta Sandy, MD;  Location: [place] [facility];  Service: Cardiovascular;  Laterality: Left;  Left subclavian  . SHOULDER SURGERY       reports that she quit smoking about 8 years ago. Her smoking use included cigarettes. She has a 20.00 pack-year smoking history. She has never used smokeless tobacco. She reports that she does not drink alcohol and does not use drugs.  Allergies  Allergen Reactions  . Penicillins Other (See Comments)    REACTION: Unknown, told as a child Has patient had a PCN reaction causing immediate rash, facial/tongue/throat swelling,  SOB or lightheadedness with hypotension: Unknown Has patient had a PCN reaction causing severe rash involving mucus membranes or skin necrosis: Unknown Has patient had a PCN reaction that required hospitalization: Unknown Has patient had a PCN reaction occurring within the last 10 years: No If all of the above answers are "NO", then may proceed with Cephalosporin use.   . Beta Adrenergic Blockers Other (See Comments)    Hypotension    Family History  Problem Relation Age of Onset  . Diabetes Mother   . Heart disease Mother        before age 31  . Hyperlipidemia Mother   . Hypertension Mother   . Thyroid disease Father   . Hypertension Father   . AAA (abdominal aortic aneurysm) Father   . Heart disease Brother        before age 58  . Hypertension Brother   . Hyperlipidemia Son   . Hypertension Son       Prior to Admission medications   Medication Sig Start Date End Date Taking? Authorizing Provider  Albuterol Sulfate 108 (90 Base) MCG/ACT AEPB Inhale 2 puffs into the lungs every 4 (four) hours as needed (Shortness of breath).    Yes [provider]  allopurinol (ZYLOPRIM) 100 MG tablet Take one tablet after hemodialysis, on Tuesday, Thursday and Saturday. Patient taking differently: Take 100 mg by mouth Every Tuesday,Thursday,and Saturday with dialysis. After dialysis 06/30/20  Yes Arrien, Jimmy Picket, MD  ALPRAZolam Duanne Moron) 0.5 MG tablet Take 0.25-0.5 mg by mouth See admin instructions. Take 0.25 in the morning and evening and 0.5 mg at bedtime   Yes [provider]  aspirin EC 81 MG tablet Take 81 mg by mouth daily.    Yes [provider]  atorvastatin (LIPITOR) 80 MG tablet Take 1 tablet (80 mg total) by mouth every evening. Patient taking differently: Take 80 mg by mouth at bedtime. 12/19/19  Yes Johnson, Clanford L, MD  brimonidine (ALPHAGAN) 0.2 % ophthalmic solution Place 1 drop into the left eye 3 (three) times daily. 02/01/21  Yes [provider]  clopidogrel (PLAVIX) 75 MG tablet Take 1 tablet (75 mg total) by mouth daily. 08/27/18  Yes Satira Sark, MD  ferric citrate (AURYXIA) 1 GM 210 MG(Fe) tablet Take 420 mg by mouth 3 (three) times daily with meals.   Yes [provider]  gabapentin (NEURONTIN) 400 MG capsule Take 1 capsule (400 mg total) by mouth 2 (two) times daily. 06/17/20  Yes Barton Dubois, MD  ipratropium (ATROVENT HFA) 17 MCG/ACT inhaler Inhale 2 puffs into the lungs every 4 (four) hours as needed for wheezing.    Yes [provider]  levothyroxine (SYNTHROID) 112 MCG tablet Take 112 mcg by mouth daily before breakfast.   Yes [provider]  nitroGLYCERIN (NITROSTAT)  0.4 MG SL tablet Place 0.4 mg under the tongue every 5 (five) minutes x 3 doses as needed for chest pain. Not to exceed 3 in 15 minute time frame   Yes [provider]  NOVOLIN 70/30 FLEXPEN (70-30) 100 UNIT/ML KwikPen Inject 24 Units into the skin in the morning and at bedtime. 06/30/20  Yes [provider]  ondansetron (ZOFRAN) 8 MG tablet Take 8 mg by mouth every 8 (eight) hours as needed for nausea. 07/12/20  Yes [provider]  pantoprazole (PROTONIX) 40 MG tablet Take 1 tablet (40 mg total) by mouth 2 (two) times daily. 06/17/20  Yes Barton Dubois, MD  ranolazine (RANEXA) 500 MG 12 hr tablet Take 1 tablet (500 mg total) by mouth 2 (two) times daily. Patient taking differently: Take 500 mg by mouth at bedtime. 07/09/20  Yes Verta Ellen., NP  Vitamin D, Ergocalciferol, (DRISDOL) 1.25 MG (50000 UNIT) CAPS capsule Take 50,000 Units by mouth every Sunday.  12/31/19  Yes [provider]  diclofenac Sodium (VOLTAREN) 1 % GEL Apply 2 g topically 4 (four) times daily as needed (apply to right foot big toe as needed for pain.). Patient not taking: Reported on 02/22/2021 06/30/20   Arrien, Jimmy Picket, MD    Physical Exam: Vitals:   02/22/21 1344 02/22/21 1400 02/22/21 1500  02/22/21 1530  BP:  (!) 123/49 (!) 155/51 (!) 143/61  Pulse:  70 69 70  Resp:  (!) 22 20 16   Temp:      TempSrc:      SpO2:  100% 98% 95%  Weight: 95.3 kg     Height: 5\' 5"  (1.651 m)        Vitals:   02/22/21 1344 02/22/21 1400 02/22/21 1500 02/22/21 1530  BP:  (!) 123/49 (!) 155/51 (!) 143/61  Pulse:  70 69 70  Resp:  (!) 22 20 16   Temp:      TempSrc:      SpO2:  100% 98% 95%  Weight: 95.3 kg     Height: 5\' 5"  (1.651 m)         Constitutional: Alert and oriented x 3 . Not in any apparent distress HEENT:      Head: Normocephalic and atraumatic.         Eyes: PERLA, EOMI, Conjunctivae are normal. Sclera is non-icteric.       Mouth/Throat: Mucous membranes are moist.       Neck: Supple with no signs of meningismus. Cardiovascular: Regular rate and rhythm. No murmurs, gallops, or rubs. 2+ symmetrical distal pulses are present . No JVD. No LE edema.  PermCath in right anterior chest wall Respiratory: Respiratory effort normal .Lungs sounds clear bilaterally. No wheezes, crackles, or rhonchi.  Gastrointestinal: Soft, non tender, and non distended with positive bowel sounds.  Central adiposity Genitourinary: No CVA tenderness. Musculoskeletal: Nontender with normal range of motion in all extremities. No cyanosis, or erythema of extremities. Neurologic:  Face is symmetric. Moving all extremities. No gross focal neurologic deficits  Skin: Skin is warm, dry.  No rash or ulcers Psychiatric: Mood and affect are normal    Labs on Admission: I have personally reviewed following labs and imaging studies  CBC: Recent Labs  Lab 02/22/21 1244  WBC 10.4  HGB 10.9*  HCT 34.3*  MCV 104.3*  PLT 626   Basic Metabolic Panel: Recent Labs  Lab 02/22/21 1244  NA 136  K 5.3*  CL 99  CO2 27  GLUCOSE 189*  BUN  66*  CREATININE 7.04*  CALCIUM 9.2   GFR: Estimated Creatinine Clearance: 9.5 mL/min (A) (by C-G formula based on SCr of 7.04 mg/dL (H)). Liver Function Tests: No  results for input(s): AST, ALT, ALKPHOS, BILITOT, PROT, ALBUMIN in the last 168 hours. No results for input(s): LIPASE, AMYLASE in the last 168 hours. No results for input(s): AMMONIA in the last 168 hours. Coagulation Profile: No results for input(s): INR, PROTIME in the last 168 hours. Cardiac Enzymes: No results for input(s): CKTOTAL, CKMB, CKMBINDEX, TROPONINI in the last 168 hours. BNP (last 3 results) No results for input(s): PROBNP in the last 8760 hours. HbA1C: No results for input(s): HGBA1C in the last 72 hours. CBG: No results for input(s): GLUCAP in the last 168 hours. Lipid Profile: No results for input(s): CHOL, HDL, LDLCALC, TRIG, CHOLHDL, LDLDIRECT in the last 72 hours. Thyroid Function Tests: No results for input(s): TSH, T4TOTAL, FREET4, T3FREE, THYROIDAB in the last 72 hours. Anemia Panel: No results for input(s): VITAMINB12, FOLATE, FERRITIN, TIBC, IRON, RETICCTPCT in the last 72 hours. Urine analysis:    Component Value Date/Time   COLORURINE YELLOW 06/18/2020 1536   APPEARANCEUR CLOUDY (A) 06/18/2020 1536   LABSPEC 1.015 06/18/2020 1536   PHURINE 5.0 06/18/2020 1536   GLUCOSEU 150 (A) 06/18/2020 1536   HGBUR NEGATIVE 06/18/2020 [place] 06/18/2020 1536   KETONESUR NEGATIVE 06/18/2020 1536   PROTEINUR >=300 (A) 06/18/2020 1536   UROBILINOGEN 0.2 10/26/2013 2229   NITRITE NEGATIVE 06/18/2020 1536   LEUKOCYTESUR NEGATIVE 06/18/2020 1536    Radiological Exams on Admission: DG Chest Port 1 View  Result Date: 02/22/2021 CLINICAL DATA:  Chest pain EXAM: PORTABLE CHEST 1 VIEW COMPARISON:  01/09/2021 FINDINGS: Right tunneled dialysis catheter is unchanged. Vascular stent is again noted along the aortic arch. Coronary artery stent also present. Postoperative changes of CABG. No new consolidation or edema. No pleural effusion or pneumothorax. Stable cardiomediastinal contours. IMPRESSION: No acute process in the chest. Electronically Signed   By:  Macy Mis M.D.   On: 02/22/2021 13:24     Assessment/Plan Principal Problem:   NSTEMI (non-ST elevated myocardial infarction) ([place]) Active Problems:   Obesity (BMI 30-39.9)   CAD (coronary artery disease)   Diabetes mellitus with ESRD (end-stage renal disease) (HCC)   Hypothyroidism   Hyperkalemia      Acute non-ST elevation MI In a patient with known coronary artery disease status post CABG Status post recent stent angioplasty 04/22 with DES x 2 to the S- RCA Patient presents for evaluation of chest pain with radiation to her left arm associated with diaphoresis and shortness of breath and has bumped her troponin > 1000 Place patient on heparin drip Continue aspirin, Plavix, Ranexa, nitrates and high intensity statin Cardiology has been consulted and recommend transfer to [facility] for further cardiac evaluation     Diabetes mellitus with complications of end-stage renal disease Patient's dialysis days are T/TH/S Nephrology has been consulted for renal placement therapy when patient arrives [place] consistent carbohydrate diet Glycemic control with sliding scale insulin    Hyperkalemia Most likely related to end-stage renal disease We will treat with Lokelma    Hypothyroidism Continue Synthroid    Obesity Complicates overall prognosis and care    COPD Not acutely exacerbated Continue as needed bronchodilator therapy    DVT prophylaxis: Heparin Code Status: full code Family Communication: Greater than 50% of time was spent discussing patient's condition and plan of care with her at the bedside.  All questions and concerns have been addressed.  She verbalizes understanding and agrees to the plan. Disposition Plan: Back to previous home environment Consults called: Cardiology/nephrology Status: At the time of admission, it appears that the appropriate admission status for the patient is inpatient. This is judged to be reasonable and  necessary in order to provide the required intensity of service to ensure the patient's safety given the presenting symptoms, physical exam findings and initial radiographic and laboratory data in the context of their comorbid conditions. Patient requires inpatient status due to high intensity of service, high risk for further deterioration and high frequency of surveillance required.    Collier Bullock MD [facility]     02/22/2021, 4:33 PM

## 2021-02-22 NOTE — Progress Notes (Signed)
ANTICOAGULATION CONSULT NOTE - Initial Consult  Pharmacy Consult for Heparin Indication: chest pain/ACS  Allergies  Allergen Reactions  . Penicillins Other (See Comments)    REACTION: Unknown, told as a child Has patient had a PCN reaction causing immediate rash, facial/tongue/throat swelling, SOB or lightheadedness with hypotension: Unknown Has patient had a PCN reaction causing severe rash involving mucus membranes or skin necrosis: Unknown Has patient had a PCN reaction that required hospitalization: Unknown Has patient had a PCN reaction occurring within the last 10 years: No If all of the above answers are "NO", then may proceed with Cephalosporin use.   . Beta Adrenergic Blockers Other (See Comments)    Hypotension    Patient Measurements: Height: 5\' 5"  (165.1 cm) Weight: 95.3 kg (210 lb) IBW/kg (Calculated) : 57 HEPARIN DW (KG): 78.5  Vital Signs: Temp: 98.5 F (36.9 C) (06/07 1155) Temp Source: Oral (06/07 1155) BP: 130/47 (06/07 1303) Pulse Rate: 71 (06/07 1303)  Labs: Recent Labs    02/22/21 1244  HGB 10.9*  HCT 34.3*  PLT 194  CREATININE 7.04*  TROPONINIHS 1,038*    Estimated Creatinine Clearance: 9.5 mL/min (A) (by C-G formula based on SCr of 7.04 mg/dL (H)).   Medical History: Past Medical History:  Diagnosis Date  . Anemia   . Anxiety   . Asthma   . CAD (coronary artery disease)    Multivessel s/p CABG 2005, numerous PCIs since that time and documented graft disease  . Carotid artery disease (HCC)    R CEA  . ESRD on hemodialysis ([place])   . Essential hypertension   . Gout   . History of blood transfusion   . Hyperlipidemia   . Hypothyroidism   . Myocardial infarction ([place])   . PAD (peripheral artery disease) ([place])    Dr. Kellie Simmering  . Pneumonia 09/2019, 11/2019  . S/P angioplasty with stent- DES to [facility] and to [facility] to [facility] with DES 04/09/18.   04/10/2018  . SBO (small bowel obstruction) ([place]) 2011   Status post lysis of adhesions & hernia repair   . Sinus bradycardia   . Type 2 diabetes mellitus ([place])   . Umbilical hernia     Medications:  See med rec  Assessment: Patient has had Chest pain since yesterday. She has taken ntg x 3 prior to arrival. Also, she was unable to do dialysis treatment today due to pain.Has elevated troponins.  Medications reviewed and she is not on oral anticoagulant therapy. Pharmacy asked to start heparin  Goal of Therapy:  Heparin level 0.3-0.7 units/ml Monitor platelets by anticoagulation protocol: Yes   Plan:  Give 4000 units bolus x 1 Start heparin infusion at 1000 units/hr Check anti-Xa level in ~6 hours and daily while on heparin Continue to monitor H&H and platelets   Isac Sarna, BS [place], BCPS Clinical Pharmacist Pager [phone] 02/22/2021,1:56 PM

## 2021-02-22 NOTE — Progress Notes (Signed)
[place] for Heparin Indication: chest pain/ACS  Allergies  Allergen Reactions  . Penicillins Other (See Comments)    REACTION: Unknown, told as a child Has patient had a PCN reaction causing immediate rash, facial/tongue/throat swelling, SOB or lightheadedness with hypotension: Unknown Has patient had a PCN reaction causing severe rash involving mucus membranes or skin necrosis: Unknown Has patient had a PCN reaction that required hospitalization: Unknown Has patient had a PCN reaction occurring within the last 10 years: No If all of the above answers are "NO", then may proceed with Cephalosporin use.   . Beta Adrenergic Blockers Other (See Comments)    Hypotension    Patient Measurements: Height: 5\' 5"  (165.1 cm) Weight: 95.3 kg (210 lb) IBW/kg (Calculated) : 57 HEPARIN DW (KG): 78.5  Vital Signs: Temp: 97.7 F (36.5 C) (06/07 2100) Temp Source: Oral (06/07 2100) BP: 157/55 (06/07 2100) Pulse Rate: 75 (06/07 2100)  Labs: Recent Labs    02/22/21 1244 02/22/21 1503 02/22/21 1753 02/22/21 1955 02/22/21 2225  HGB 10.9*  --   --   --   --   HCT 34.3*  --   --   --   --   PLT 194  --   --   --   --   HEPARINUNFRC  --   --   --   --  0.10*  CREATININE 7.04*  --   --   --   --   TROPONINIHS 1,038* 1,104* 1,226* 1,277*  --     Estimated Creatinine Clearance: 9.5 mL/min (A) (by C-G formula based on SCr of 7.04 mg/dL (H)).   Medical History: Past Medical History:  Diagnosis Date  . Anemia   . Anxiety   . Asthma   . CAD (coronary artery disease)    Multivessel s/p CABG 2005, numerous PCIs since that time and documented graft disease  . Carotid artery disease (HCC)    R CEA  . ESRD on hemodialysis ([place])   . Essential hypertension   . Gout   . History of blood transfusion   . Hyperlipidemia   . Hypothyroidism   . Myocardial infarction ([place])   . PAD (peripheral artery disease) ([place])    Dr. Kellie Simmering  . Pneumonia 09/2019,  11/2019  . S/P angioplasty with stent- DES to [facility] and to [facility] to [facility] with DES 04/09/18.   04/10/2018  . SBO (small bowel obstruction) ([place]) 2011   Status post lysis of adhesions & hernia repair  . Sinus bradycardia   . Type 2 diabetes mellitus ([place])   . Umbilical hernia     Medications:  See med rec  Assessment: Patient has had Chest pain since yesterday. She has taken ntg x 3 prior to arrival. Also, she was unable to do dialysis treatment today due to pain.Has elevated troponins.  Medications reviewed and she is not on oral anticoagulant therapy. Pharmacy asked to start heparin  6/7 PM update:  Heparin level low Small inc in trop  Goal of Therapy:  Heparin level 0.3-0.7 units/ml Monitor platelets by anticoagulation protocol: Yes   Plan:  Heparin 2000 units bolus Inc heparin to 1100 units/hr Re-check heparin level in 8 hours   Narda Bonds, PharmD, [place] Pharmacist Phone: [phone]

## 2021-02-22 NOTE — ED Provider Notes (Addendum)
[facility] EMERGENCY DEPARTMENT Provider Note   CSN: [id number] Arrival date & time: 02/22/21  1134     History Chief Complaint  Patient presents with  . Chest Pain    Donna Howe is a 65 y.o. female.  Patient c/o mid chest pain at rest, onset yesterday, acute onset, dull, moderate, non radiating, not pleuritic. No associated sob, nv or diaphoresis. Denies cough or uri symptoms. No fever or chills. Hx cad. Also hx esrd on hd, had normal hd 3 days ago, but did not go today. Recent prior admission for nstemi - says seems a bit different. Denies leg pain or swelling. Denies dvt or pe hx. No pleuritic symptoms or pain. Hx gerd, unsure if similar. Denies any recent exertional cp or discomfort. Takes asa q day, took a ntg with no immediate relief in pain. States currently cp is resolved.   The history is provided by the patient.       Past Medical History:  Diagnosis Date  . Anemia   . Anxiety   . Asthma   . CAD (coronary artery disease)    Multivessel s/p CABG 2005, numerous PCIs since that time and documented graft disease  . Carotid artery disease (HCC)    R CEA  . ESRD on hemodialysis ([place])   . Essential hypertension   . Gout   . History of blood transfusion   . Hyperlipidemia   . Hypothyroidism   . Myocardial infarction ([place])   . PAD (peripheral artery disease) ([place])    Dr. Kellie Simmering  . Pneumonia 09/2019, 11/2019  . S/P angioplasty with stent- DES to [facility] and to [facility] to LAD with DES 04/09/18.   04/10/2018  . SBO (small bowel obstruction) ([place]) 2011   Status post lysis of adhesions & hernia repair  . Sinus bradycardia   . Type 2 diabetes mellitus ([place])   . Umbilical hernia     Patient Active Problem List   Diagnosis Date Noted  . Leukocytosis 01/09/2021  . Elevated MCV 01/09/2021  . Diabetic neuropathy ([place]) 01/09/2021  . Unspecified protein-calorie malnutrition ([place]) 07/05/2020  . Allergy, unspecified, initial encounter 06/30/2020  . Anaphylactic shock,  unspecified, initial encounter 06/30/2020  . Anemia in chronic kidney disease 06/30/2020  . Coagulation defect, unspecified ([place]) 06/30/2020  . Diarrhea, unspecified 06/30/2020  . Encounter for immunization 06/30/2020  . Iron deficiency anemia, unspecified 06/30/2020  . Pain, unspecified 06/30/2020  . Peripheral vascular disease ([place]) 06/30/2020  . Pruritus, unspecified 06/30/2020  . Secondary hyperparathyroidism of renal origin ([place]) 06/30/2020  . ESRD on hemodialysis ([place])   . SOB (shortness of breath)   . Hyperkalemia 06/18/2020  . Chronic diastolic HF (heart failure) ([place])   . GERD (gastroesophageal reflux disease)   . Acute renal failure superimposed on stage 5 chronic kidney disease, not on chronic dialysis ([place]) 06/13/2020  . Uncontrolled type 2 diabetes mellitus with hyperglycemia, with long-term current use of insulin ([place]) 06/13/2020  . Pneumonia 12/17/2019  . Cough variant asthma with component of UACS 11/20/2019  . Elevated troponin I level 10/02/2019  . Chest pain 10/01/2019  . Unstable angina ([place]) 04/18/2018  . Hypothyroidism 04/18/2018  . S/P angioplasty with stent- DES to [facility] and to [facility] to LAD with DES 04/09/18.   04/10/2018  . Angina pectoris ([place]) 04/05/2018  . Status post coronary artery stent placement   . Acute coronary syndrome ([place]) 05/31/2017  . Acute chest pain   . NSTEMI (non-ST elevated myocardial infarction) ([place])   . History  of coronary artery disease   . Diabetes ([place]) 10/28/2013  . Hypoglycemia associated with diabetes ([place]) 10/28/2013  . Thrombocytopenia, unspecified ([place]) 10/28/2013  . Hypokalemia 10/27/2013  . Syncope 10/26/2013  . Anemia 10/26/2013  . AKI (acute kidney injury) ([place]) 10/26/2013  . Fracture of toe of right foot 10/26/2013  . Umbilical hernia   . Carotid artery disease ([place])   . Occlusion and stenosis of carotid artery without mention of cerebral infarction 04/15/2013  . Hx of CABG   . Ejection fraction   . Atherosclerosis  of native artery of extremity with intermittent claudication ([employer]) 02/11/2013  . Diabetes mellitus with renal manifestation ([place]) 01/21/2013  . Bradycardia 01/03/2013  . Chronic kidney disease (CKD), stage III (moderate) (HCC)   . Gout   . PROTEINURIA, MILD 01/18/2010  . Obesity (BMI 30-39.9) 08/26/2009  . Asthma 08/26/2009  . Hyperlipidemia 03/31/2007  . Essential hypertension 03/31/2007  . CAD (coronary artery disease) 03/31/2007    Past Surgical History:  Procedure Laterality Date  . AORTIC ARCH ANGIOGRAPHY N/A 08/02/2020   Procedure: AORTIC ARCH ANGIOGRAPHY;  Surgeon: Waynetta Sandy, MD;  Location: [place] CV LAB;  Service: Cardiovascular;  Laterality: N/A;  Lt upper extermity  . AV FISTULA PLACEMENT Left 06/29/2020   Procedure: LEFT ARM ARTERIOVENOUS (AV) FISTULA;  Surgeon: Waynetta Sandy, MD;  Location: [place];  Service: Vascular;  Laterality: Left;  ARM  . CESAREAN SECTION  1984  . CHOLECYSTECTOMY  2010  . CORONARY ARTERY BYPASS GRAFT  2005  . CORONARY BALLOON ANGIOPLASTY N/A 05/31/2017   Procedure: CORONARY BALLOON ANGIOPLASTY;  Surgeon: Jettie Booze, MD;  Location: [place] CV [facility];  Service: Cardiovascular;  Laterality: N/A;  . CORONARY STENT INTERVENTION N/A 05/31/2017   Procedure: CORONARY STENT INTERVENTION;  Surgeon: Jettie Booze, MD;  Location: [place] CV LAB;  Service: Cardiovascular;  Laterality: N/A;  . CORONARY STENT INTERVENTION N/A 04/09/2018   Procedure: CORONARY STENT INTERVENTION;  Surgeon: Jettie Booze, MD;  Location: [place] CV LAB;  Service: Cardiovascular;  Laterality: N/A;  SVG RCA  . CORONARY STENT INTERVENTION N/A 01/10/2021   Procedure: CORONARY STENT INTERVENTION;  Surgeon: Leonie Man, MD;  Location: [place] [facility];  Service: Cardiovascular;  Laterality: N/A;  . ENDARTERECTOMY Right 04/18/2013   Procedure: ENDARTERECTOMY CAROTID;  Surgeon: Mal Misty, MD;  Location: [place];  Service:  Vascular;  Laterality: Right;  . HERNIA REPAIR  1989  . Incisional hernia repair x2  03/04/2010   Laparoscopic with 35cm mesh by Dr Ronnald Collum  . IR FLUORO GUIDE CV LINE RIGHT  06/21/2020  . IR US GUIDE VASC ACCESS RIGHT  06/21/2020  . LEFT HEART CATH AND CORS/GRAFTS ANGIOGRAPHY N/A 05/31/2017   Procedure: LEFT HEART CATH AND CORS/GRAFTS ANGIOGRAPHY;  Surgeon: Jettie Booze, MD;  Location: [place] CV LAB;  Service: Cardiovascular;  Laterality: N/A;  . LEFT HEART CATH AND CORS/GRAFTS ANGIOGRAPHY N/A 04/08/2018   Procedure: LEFT HEART CATH AND CORS/GRAFTS ANGIOGRAPHY;  Surgeon: Jettie Booze, MD;  Location: [place] CV [facility];  Service: Cardiovascular;  Laterality: N/A;  . LEFT HEART CATH AND CORS/GRAFTS ANGIOGRAPHY N/A 06/22/2020   Procedure: LEFT HEART CATH AND CORS/GRAFTS ANGIOGRAPHY;  Surgeon: Belva Crome, MD;  Location: [place] CV LAB;  Service: Cardiovascular;  Laterality: N/A;  . LEFT HEART CATH AND CORS/GRAFTS ANGIOGRAPHY N/A 01/10/2021   Procedure: LEFT HEART CATH AND CORS/GRAFTS ANGIOGRAPHY;  Surgeon: Leonie Man, MD;  Location: [place] CV LAB;  Service: Cardiovascular;  Laterality: N/A;  . LEFT HEART CATHETERIZATION WITH CORONARY ANGIOGRAM N/A 12/19/2012   Procedure: LEFT HEART CATHETERIZATION WITH CORONARY ANGIOGRAM;  Surgeon: Josue Hector, MD;  Location: [facility] CATH LAB;  Service: Cardiovascular;  Laterality: N/A;  . LEFT HEART CATHETERIZATION WITH CORONARY/GRAFT ANGIOGRAM N/A 04/19/2013   Procedure: LEFT HEART CATHETERIZATION WITH Beatrix Fetters;  Surgeon: Lorretta Harp, MD;  Location: [facility] CATH LAB;  Service: Cardiovascular;  Laterality: N/A;  . LIGATION OF ARTERIOVENOUS  FISTULA Left 09/15/2020   Procedure: LIGATION OF LEFT ARM ARTERIOVENOUS  FISTULA;  Surgeon: Waynetta Sandy, MD;  Location: [place];  Service: Vascular;  Laterality: Left;  . PATCH ANGIOPLASTY Right 04/18/2013   Procedure: PATCH ANGIOPLASTY Right Internal Carotid Artery;   Surgeon: Mal Misty, MD;  Location: [place];  Service: Vascular;  Laterality: Right;  . PERCUTANEOUS CORONARY STENT INTERVENTION (PCI-S) Right 12/19/2012   Procedure: PERCUTANEOUS CORONARY STENT INTERVENTION (PCI-S);  Surgeon: Josue Hector, MD;  Location: [facility] CATH LAB;  Service: Cardiovascular;  Laterality: Right;  . PERIPHERAL VASCULAR INTERVENTION Left 08/02/2020   Procedure: PERIPHERAL VASCULAR INTERVENTION;  Surgeon: Waynetta Sandy, MD;  Location: [place] CV LAB;  Service: Cardiovascular;  Laterality: Left;  Left subclavian  . SHOULDER SURGERY       OB History   No obstetric history on file.     Family History  Problem Relation Age of Onset  . Diabetes Mother   . Heart disease Mother        before age 63  . Hyperlipidemia Mother   . Hypertension Mother   . Thyroid disease Father   . Hypertension Father   . AAA (abdominal aortic aneurysm) Father   . Heart disease Brother        before age 75  . Hypertension Brother   . Hyperlipidemia Son   . Hypertension Son     Social History   Tobacco Use  . Smoking status: Former Smoker    Packs/day: 1.00    Years: 20.00    Pack years: 20.00    Types: Cigarettes    Quit date: 12/10/2012    Years since quitting: 8.2  . Smokeless tobacco: Never Used  Vaping Use  . Vaping Use: Never used  Substance Use Topics  . Alcohol use: No    Alcohol/week: 0.0 standard drinks  . Drug use: No    Home Medications Prior to Admission medications   Medication Sig Start Date End Date Taking? Authorizing Provider  Albuterol Sulfate 108 (90 Base) MCG/ACT AEPB Inhale 2 puffs into the lungs every 4 (four) hours as needed (Shortness of breath).     [provider]  allopurinol (ZYLOPRIM) 100 MG tablet Take one tablet after hemodialysis, on Tuesday, Thursday and Saturday. Patient taking differently: Take 100 mg by mouth Every Tuesday,Thursday,and Saturday with dialysis. After dialysis 06/30/20   Arrien, Jimmy Picket, MD   ALPRAZolam Duanne Moron) 0.5 MG tablet Take 0.25-0.5 mg by mouth See admin instructions. Take 0.25 in the morning and evening and 0.5 mg at bedtime    [provider]  aspirin EC 81 MG tablet Take 81 mg by mouth daily.     [provider]  atorvastatin (LIPITOR) 80 MG tablet Take 1 tablet (80 mg total) by mouth every evening. Patient taking differently: Take 80 mg by mouth at bedtime. 12/19/19   Johnson, Clanford L, MD  clopidogrel (PLAVIX) 75 MG tablet Take 1 tablet (75 mg total) by mouth daily. 08/27/18   Rozann Lesches  G, MD  diclofenac Sodium (VOLTAREN) 1 % GEL Apply 2 g topically 4 (four) times daily as needed (apply to right foot big toe as needed for pain.). 06/30/20   Arrien, Jimmy Picket, MD  ferric citrate (AURYXIA) 1 GM 210 MG(Fe) tablet Take 420 mg by mouth 3 (three) times daily with meals.    [provider]  gabapentin (NEURONTIN) 400 MG capsule Take 1 capsule (400 mg total) by mouth 2 (two) times daily. 06/17/20   Barton Dubois, MD  ipratropium (ATROVENT HFA) 17 MCG/ACT inhaler Inhale 2 puffs into the lungs every 4 (four) hours as needed for wheezing.     [provider]  levothyroxine (SYNTHROID) 112 MCG tablet Take 112 mcg by mouth daily before breakfast.    [provider]  nitroGLYCERIN (NITROSTAT) 0.4 MG SL tablet Place 0.4 mg under the tongue every 5 (five) minutes x 3 doses as needed for chest pain. Not to exceed 3 in 15 minute time frame    [provider]  NOVOLIN 70/30 FLEXPEN (70-30) 100 UNIT/ML KwikPen Inject 24 Units into the skin in the morning and at bedtime. 06/30/20   [provider]  ondansetron (ZOFRAN) 8 MG tablet Take 8 mg by mouth every 8 (eight) hours as needed for nausea. 07/12/20   [provider]  pantoprazole (PROTONIX) 40 MG tablet Take 1 tablet (40 mg total) by mouth 2 (two) times daily. 06/17/20   Barton Dubois, MD  ranolazine (RANEXA) 500 MG 12 hr tablet Take 1 tablet (500 mg total) by  mouth 2 (two) times daily. Patient taking differently: Take 500 mg by mouth at bedtime. 07/09/20   Verta Ellen., NP  Vitamin D, Ergocalciferol, (DRISDOL) 1.25 MG (50000 UNIT) CAPS capsule Take 50,000 Units by mouth every Sunday.  12/31/19   [provider]    Allergies    Penicillins and Beta adrenergic blockers  Review of Systems   Review of Systems  Constitutional: Negative for chills and fever.  HENT: Negative for sore throat.   Eyes: Negative for redness.  Respiratory: Negative for shortness of breath.   Cardiovascular: Positive for chest pain. Negative for palpitations and leg swelling.  Gastrointestinal: Negative for abdominal pain, nausea and vomiting.  Genitourinary: Negative for flank pain.  Musculoskeletal: Negative for back pain and neck pain.  Skin: Negative for rash.  Neurological: Negative for headaches.  Hematological: Does not bruise/bleed easily.  Psychiatric/Behavioral: Negative for confusion.    Physical Exam Updated Vital Signs BP (!) 130/47   Pulse 71   Temp 98.5 F (36.9 C) (Oral)   Resp 12   SpO2 99%   Physical Exam Vitals and nursing note reviewed.  Constitutional:      Appearance: Normal appearance. She is well-developed.  HENT:     Head: Atraumatic.     Nose: Nose normal.     Mouth/Throat:     Mouth: Mucous membranes are moist.  Eyes:     General: No scleral icterus.    Conjunctiva/sclera: Conjunctivae normal.  Neck:     Trachea: No tracheal deviation.  Cardiovascular:     Rate and Rhythm: Normal rate and regular rhythm.     Pulses: Normal pulses.     Heart sounds: Normal heart sounds. No murmur heard. No friction rub. No gallop.   Pulmonary:     Effort: Pulmonary effort is normal. No respiratory distress.     Breath sounds: Normal breath sounds.     Comments: HD cath right chest without sign of  infection Chest:     Chest wall: No tenderness.  Abdominal:     General: Bowel sounds are normal. There is no distension.      Palpations: Abdomen is soft.     Tenderness: There is no abdominal tenderness. There is no guarding.  Genitourinary:    Comments: No cva tenderness.  Musculoskeletal:        General: No swelling or tenderness.     Cervical back: Normal range of motion and neck supple. No rigidity. No muscular tenderness.     Right lower leg: No edema.     Left lower leg: No edema.  Skin:    General: Skin is warm and dry.     Findings: No rash.  Neurological:     Mental Status: She is alert.     Comments: Alert, speech normal.   Psychiatric:        Mood and Affect: Mood normal.     ED Results / Procedures / Treatments   Labs (all labs ordered are listed, but only abnormal results are displayed) Results for orders placed or performed during the hospital encounter of 52/77/82  Basic metabolic panel  Result Value Ref Range   Sodium 136 135 - 145 mmol/L   Potassium 5.3 (H) 3.5 - 5.1 mmol/L   Chloride 99 98 - 111 mmol/L   CO2 27 22 - 32 mmol/L   Glucose, Bld 189 (H) 70 - 99 mg/dL   BUN 66 (H) 8 - 23 mg/dL   Creatinine, Ser 7.04 (H) 0.44 - 1.00 mg/dL   Calcium 9.2 8.9 - 10.3 mg/dL   GFR, Estimated 6 (L) >60 mL/min   Anion gap 10 5 - 15  CBC  Result Value Ref Range   WBC 10.4 4.0 - 10.5 K/uL   RBC 3.29 (L) 3.87 - 5.11 MIL/uL   Hemoglobin 10.9 (L) 12.0 - 15.0 g/dL   HCT 34.3 (L) 36.0 - 46.0 %   MCV 104.3 (H) 80.0 - 100.0 fL   MCH 33.1 26.0 - 34.0 pg   MCHC 31.8 30.0 - 36.0 g/dL   RDW 14.0 11.5 - 15.5 %   Platelets 194 150 - 400 K/uL   nRBC 0.0 0.0 - 0.2 %  Troponin I (High Sensitivity)  Result Value Ref Range   Troponin I (High Sensitivity) 1,038 (HH) <18 ng/L   DG Chest Port 1 View  Result Date: 02/22/2021 CLINICAL DATA:  Chest pain EXAM: PORTABLE CHEST 1 VIEW COMPARISON:  01/09/2021 FINDINGS: Right tunneled dialysis catheter is unchanged. Vascular stent is again noted along the aortic arch. Coronary artery stent also present. Postoperative changes of CABG. No new consolidation or  edema. No pleural effusion or pneumothorax. Stable cardiomediastinal contours. IMPRESSION: No acute process in the chest. Electronically Signed   By: Macy Mis M.D.   On: 02/22/2021 13:24    EKG EKG Interpretation  Date/Time:  Tuesday February 22 2021 11:53:46 EDT Ventricular Rate:  76 PR Interval:  170 QRS Duration: 102 QT Interval:  416 QTC Calculation: 468 R Axis:   64 Text Interpretation: Normal sinus rhythm Nonspecific ST and T wave abnormality `st changss infl/lat similar to ecg 01/11/2021 Confirmed by Lajean Saver [phone]) on 02/22/2021 12:00:54 PM   Radiology DG Chest Port 1 View  Result Date: 02/22/2021 CLINICAL DATA:  Chest pain EXAM: PORTABLE CHEST 1 VIEW COMPARISON:  01/09/2021 FINDINGS: Right tunneled dialysis catheter is unchanged. Vascular stent is again noted along the aortic arch. Coronary artery stent also present. Postoperative changes of CABG.  No new consolidation or edema. No pleural effusion or pneumothorax. Stable cardiomediastinal contours. IMPRESSION: No acute process in the chest. Electronically Signed   By: Macy Mis M.D.   On: 02/22/2021 13:24    Procedures Procedures   Medications Ordered in ED Medications - No data to display  ED Course  I have reviewed the triage vital signs and the nursing notes.  Pertinent labs & imaging results that were available during my care of the patient were reviewed by me and considered in my medical decision making (see chart for details).    MDM Rules/Calculators/A&P                          Iv ns. Continuous pulse ox and cardiac monitoring. Stat labs. Imaging ecg. Pt indicates already took asa today.   ECG is abnormal today  but similar to prior (pt admitted then w nstemi, mild bump in trop t hen, so current and prior ecg both concerning for ischemia).   Reviewed nursing notes and prior charts for additional history.   Labs reviewed/interpreted by me - initial trop v high.   Heparin iv per pharmacy/ACS.  Cardiology consulted.   CXR reviewed/interpreted by me - no pna.   Discussed w [facility] on call cardiologist (?Dr Nathan/[facility]) - reviewed recent cath, current ecg and trop - indicates from his perspective could be admitted to hospitalist at [facility], heparin, observation, trend trops, etc. but not familiar with our various protocols for cp management between our sites.   Will discuss patient with cardiologist on call at [facility] - paged.   CRITICAL CARE RE: Nstemi/ACS with markedly elevated troponin.  Performed by: Mirna Mires Total critical care time: 45 minutes Critical care time was exclusive of separately billable procedures and treating other patients. Critical care was necessary to treat or prevent imminent or life-threatening deterioration. Critical care was time spent personally by me on the following activities: development of treatment plan with patient and/or surrogate as well as nursing, discussions with consultants, evaluation of patient's response to treatment, examination of patient, obtaining history from patient or surrogate, ordering and performing treatments and interventions, ordering and review of laboratory studies, ordering and review of radiographic studies, pulse oximetry and re-evaluation of patient's condition.  Discussed pt with cardiology at [facility], incl recent cath, labs, ecg, conversation w cardiology here - they indicate given esrd/hd, consulted [facility] hospitalist for admission and transfer to [facility], they will see once arrives at [facility] in consult, and that [facility] cardiologist can also leave consult note while here. Hospitalists consulted for admission - discussed pt, will admit to [facility], pt will need HD when at [facility]. Cardiologist at [facility] re-consulted to eval/leave note, transfer/admit to [facility] is pending.     Final Clinical Impression(s) / ED Diagnoses Final diagnoses:  None    Rx / DC Orders ED Discharge Orders    None          Lajean Saver, MD 02/22/21 1551

## 2021-02-22 NOTE — ED Notes (Signed)
Pt ambulated to bathroom without difficulty. Gait steady and pt appears to be in NAD. 

## 2021-02-22 NOTE — ED Notes (Addendum)
Critical value Troponin 1226 reported to EDP [place] at this time. No new orders. Pt already on Heparin and going to [facility]

## 2021-02-22 NOTE — ED Notes (Addendum)
All transfer paperwork provided to transport at this time. All questions answered and pt moved over to stretcher without difficulty. Pt being transported off unit at this time.

## 2021-02-23 ENCOUNTER — Other Ambulatory Visit (HOSPITAL_COMMUNITY): Payer: Medicare HMO

## 2021-02-23 ENCOUNTER — Other Ambulatory Visit: Payer: Self-pay

## 2021-02-23 DIAGNOSIS — I771 Stricture of artery: Secondary | ICD-10-CM

## 2021-02-23 DIAGNOSIS — I739 Peripheral vascular disease, unspecified: Secondary | ICD-10-CM

## 2021-02-23 DIAGNOSIS — Z992 Dependence on renal dialysis: Secondary | ICD-10-CM

## 2021-02-23 DIAGNOSIS — I25700 Atherosclerosis of coronary artery bypass graft(s), unspecified, with unstable angina pectoris: Secondary | ICD-10-CM

## 2021-02-23 LAB — CBC
HCT: 33 % — ABNORMAL LOW (ref 36.0–46.0)
Hemoglobin: 10.7 g/dL — ABNORMAL LOW (ref 12.0–15.0)
MCH: 33.1 pg (ref 26.0–34.0)
MCHC: 32.4 g/dL (ref 30.0–36.0)
MCV: 102.2 fL — ABNORMAL HIGH (ref 80.0–100.0)
Platelets: 162 10*3/uL (ref 150–400)
RBC: 3.23 MIL/uL — ABNORMAL LOW (ref 3.87–5.11)
RDW: 13.7 % (ref 11.5–15.5)
WBC: 9.1 10*3/uL (ref 4.0–10.5)
nRBC: 0 % (ref 0.0–0.2)

## 2021-02-23 LAB — BASIC METABOLIC PANEL
Anion gap: 16 — ABNORMAL HIGH (ref 5–15)
BUN: 69 mg/dL — ABNORMAL HIGH (ref 8–23)
CO2: 21 mmol/L — ABNORMAL LOW (ref 22–32)
Calcium: 9.3 mg/dL (ref 8.9–10.3)
Chloride: 99 mmol/L (ref 98–111)
Creatinine, Ser: 7.23 mg/dL — ABNORMAL HIGH (ref 0.44–1.00)
GFR, Estimated: 6 mL/min — ABNORMAL LOW (ref >60)
Glucose, Bld: 176 mg/dL — ABNORMAL HIGH (ref 70–99)
Potassium: 5.6 mmol/L — ABNORMAL HIGH (ref 3.5–5.1)
Sodium: 136 mmol/L (ref 135–145)

## 2021-02-23 LAB — LIPID PANEL
Cholesterol: 168 mg/dL (ref 0–200)
HDL: 41 mg/dL (ref >40)
LDL Cholesterol: 76 mg/dL (ref 0–99)
Total CHOL/HDL Ratio: 4.1 RATIO
Triglycerides: 257 mg/dL — ABNORMAL HIGH (ref <150)
VLDL: 51 mg/dL — ABNORMAL HIGH (ref 0–40)

## 2021-02-23 LAB — RENAL FUNCTION PANEL
Albumin: 2.7 g/dL — ABNORMAL LOW (ref 3.5–5.0)
Anion gap: 10 (ref 5–15)
BUN: 69 mg/dL — ABNORMAL HIGH (ref 8–23)
CO2: 26 mmol/L (ref 22–32)
Calcium: 8.7 mg/dL — ABNORMAL LOW (ref 8.9–10.3)
Chloride: 98 mmol/L (ref 98–111)
Creatinine, Ser: 7.21 mg/dL — ABNORMAL HIGH (ref 0.44–1.00)
GFR, Estimated: 6 mL/min — ABNORMAL LOW (ref >60)
Glucose, Bld: 235 mg/dL — ABNORMAL HIGH (ref 70–99)
Phosphorus: 6.5 mg/dL — ABNORMAL HIGH (ref 2.5–4.6)
Potassium: 5.4 mmol/L — ABNORMAL HIGH (ref 3.5–5.1)
Sodium: 134 mmol/L — ABNORMAL LOW (ref 135–145)

## 2021-02-23 LAB — GLUCOSE, CAPILLARY
Glucose-Capillary: 178 mg/dL — ABNORMAL HIGH (ref 70–99)
Glucose-Capillary: 179 mg/dL — ABNORMAL HIGH (ref 70–99)
Glucose-Capillary: 209 mg/dL — ABNORMAL HIGH (ref 70–99)
Glucose-Capillary: 152 mg/dL — ABNORMAL HIGH (ref 70–99)
Glucose-Capillary: 272 mg/dL — ABNORMAL HIGH (ref 70–99)
Glucose-Capillary: 105 mg/dL — ABNORMAL HIGH (ref 70–99)

## 2021-02-23 LAB — HEPARIN LEVEL (UNFRACTIONATED): Heparin Unfractionated: 0.16 IU/mL — ABNORMAL LOW (ref 0.30–0.70)

## 2021-02-23 MED ORDER — KETOROLAC TROMETHAMINE 0.5 % OP SOLN
1.0000 [drp] | Freq: Four times a day (QID) | OPHTHALMIC | Status: DC
  Administered 2021-02-23 – 2021-02-25 (×6): 1 [drp] via OPHTHALMIC
  Filled 2021-02-23: qty 5

## 2021-02-23 MED ORDER — PREDNISOLONE ACETATE 1 % OP SUSP
1.0000 [drp] | Freq: Three times a day (TID) | OPHTHALMIC | Status: DC
  Administered 2021-02-23 – 2021-02-25 (×6): 1 [drp] via OPHTHALMIC
  Filled 2021-02-23: qty 5

## 2021-02-23 MED ORDER — LIDOCAINE HCL (PF) 1 % IJ SOLN: 5.0000 mL | INTRAMUSCULAR | Status: DC | PRN

## 2021-02-23 MED ORDER — HEPARIN SODIUM (PORCINE) 1000 UNIT/ML DIALYSIS
1000.0000 [IU] | INTRAMUSCULAR | Status: DC | PRN
  Filled 2021-02-23: qty 1

## 2021-02-23 MED ORDER — CHLORHEXIDINE GLUCONATE CLOTH 2 % EX PADS
6.0000 | MEDICATED_PAD | Freq: Every day | CUTANEOUS | Status: DC
  Administered 2021-02-23 – 2021-02-25 (×2): 6 via TOPICAL

## 2021-02-23 MED ORDER — ALTEPLASE 2 MG IJ SOLR
2.0000 mg | Freq: Once | INTRAMUSCULAR | Status: DC | PRN
  Filled 2021-02-23: qty 2

## 2021-02-23 MED ORDER — INSULIN ISOPHANE & REGULAR (HUMAN 70-30)100 UNIT/ML KWIKPEN: 10.0000 [IU] | PEN_INJECTOR | Freq: Two times a day (BID) | SUBCUTANEOUS | Status: DC

## 2021-02-23 MED ORDER — CALCITRIOL 0.5 MCG PO CAPS
ORAL_CAPSULE | ORAL | Status: AC
  Administered 2021-02-23: 1 ug via ORAL
  Filled 2021-02-23: qty 2

## 2021-02-23 MED ORDER — SODIUM CHLORIDE 0.9 % IV SOLN: 100.0000 mL | INTRAVENOUS | Status: DC | PRN

## 2021-02-23 MED ORDER — PREDNISOLONE ACETATE 1 % OP SUSP
1.0000 [drp] | Freq: Two times a day (BID) | OPHTHALMIC | Status: DC
  Filled 2021-02-23: qty 5

## 2021-02-23 MED ORDER — PREDNISOLONE ACETATE 1 % OP SUSP
1.0000 [drp] | Freq: Every day | OPHTHALMIC | Status: DC
  Filled 2021-02-23: qty 5

## 2021-02-23 MED ORDER — HEPARIN SODIUM (PORCINE) 1000 UNIT/ML IJ SOLN
INTRAMUSCULAR | Status: AC
  Filled 2021-02-23: qty 6

## 2021-02-23 MED ORDER — INSULIN ASPART PROT & ASPART (70-30 MIX) 100 UNIT/ML ~~LOC~~ SUSP
10.0000 [IU] | Freq: Two times a day (BID) | SUBCUTANEOUS | Status: DC
  Administered 2021-02-23 – 2021-02-25 (×3): 10 [IU] via SUBCUTANEOUS
  Filled 2021-02-23: qty 10

## 2021-02-23 MED ORDER — PENTAFLUOROPROP-TETRAFLUOROETH EX AERO: 1.0000 "application " | INHALATION_SPRAY | CUTANEOUS | Status: DC | PRN

## 2021-02-23 MED ORDER — LIDOCAINE-PRILOCAINE 2.5-2.5 % EX CREA
1.0000 "application " | TOPICAL_CREAM | CUTANEOUS | Status: DC | PRN
  Filled 2021-02-23: qty 5

## 2021-02-23 NOTE — Progress Notes (Addendum)
Progress Note  Patient Name: Donna Howe Date of Encounter: 02/23/2021  [facility] Cardiologist: Rozann Lesches, MD  Subjective   1/10 chest pain on the left side. No significant dyspnea. Current chest discomfort feels different from the previous angina.   Inpatient Medications    Scheduled Meds: . [START ON 02/24/2021] allopurinol  100 mg Oral Q T,Th,Sa-HD  . ALPRAZolam  0.25 mg Oral BID AC  . ALPRAZolam  0.5 mg Oral QHS  . aspirin EC  81 mg Oral Daily  . atorvastatin  80 mg Oral QHS  . calcitRIOL  1 mcg Oral QODAY  . Chlorhexidine Gluconate Cloth  6 each Topical Q0600  . Chlorhexidine Gluconate Cloth  6 each Topical Q0600  . Chlorhexidine Gluconate Cloth  6 each Topical Q0600  . clopidogrel  75 mg Oral Daily  . ferric citrate  420 mg Oral TID WC  . gabapentin  400 mg Oral BID  . insulin aspart  0-6 Units Subcutaneous TID WC  . insulin aspart protamine- aspart  10 Units Subcutaneous BID WC  . ketorolac  1 drop Left Eye QID  . levothyroxine  112 mcg Oral QAC breakfast  . pantoprazole  40 mg Oral BID  . prednisoLONE acetate  1 drop Left Eye TID   Followed by  . [START ON 03/02/2021] prednisoLONE acetate  1 drop Left Eye BID   Followed by  . [START ON 03/09/2021] prednisoLONE acetate  1 drop Left Eye Daily  . ranolazine  500 mg Oral QHS  . [START ON 02/27/2021] Vitamin D (Ergocalciferol)  50,000 Units Oral Q Sun   Continuous Infusions: . heparin 1,100 Units/hr (02/23/21 0730)   PRN Meds: acetaminophen, ipratropium-albuterol, nitroGLYCERIN, ondansetron (ZOFRAN) IV   Vital Signs    Vitals:   02/23/21 1200 02/23/21 1248 02/23/21 1315 02/23/21 1322  BP: 132/65 (!) 142/65  136/62  Pulse: 78 82    Resp:  18 18   Temp:  98 F (36.7 C) 99.2 F (37.3 C)   TempSrc:  Oral Oral   SpO2:  98%    Weight:  96.8 kg    Height:        Intake/Output Summary (Last 24 hours) at 02/23/2021 1440 Last data filed at 02/23/2021 1231 Gross per 24 hour  Intake 581.05 ml  Output  2942 ml  Net -2360.95 ml   Last 3 Weights 02/23/2021 02/23/2021 02/23/2021  Weight (lbs) 213 lb 6.5 oz 219 lb 12.8 oz 217 lb 6.4 oz  Weight (kg) 96.8 kg 99.7 kg 98.612 kg      Telemetry    NSR without significant ventricular ectopy - Personally Reviewed  ECG    NSR with TWI in the inferolateral leads - Personally Reviewed  Physical Exam    GEN: No acute distress.   Neck: No JVD Cardiac: RRR, no murmurs, rubs, or gallops.  Respiratory: Clear to auscultation bilaterally. GI: Soft, nontender, non-distended  MS: No edema; No deformity. Neuro:  Nonfocal  Psych: Normal affect   Labs    High Sensitivity Troponin:   Recent Labs  Lab 02/22/21 1244 02/22/21 1503 02/22/21 1753 02/22/21 1955  TROPONINIHS 1,038* 1,104* 1,226* 1,277*      Chemistry Recent Labs  Lab 02/22/21 1244 02/23/21 0446 02/23/21 0638  NA 136 136 134*  K 5.3* 5.6* 5.4*  CL 99 99 98  CO2 27 21* 26  GLUCOSE 189* 176* 235*  BUN 66* 69* 69*  CREATININE 7.04* 7.23* 7.21*  CALCIUM 9.2 9.3 8.7*  ALBUMIN  --   --  2.7*  GFRNONAA 6* 6* 6*  ANIONGAP 10 16* 10     Hematology Recent Labs  Lab 02/22/21 1244  WBC 10.4  RBC 3.29*  HGB 10.9*  HCT 34.3*  MCV 104.3*  MCH 33.1  MCHC 31.8  RDW 14.0  PLT 194    BNPNo results for input(s): BNP, PROBNP in the last 168 hours.   DDimer No results for input(s): DDIMER in the last 168 hours.   Radiology    DG Chest Port 1 View  Result Date: 02/22/2021 CLINICAL DATA:  Chest pain EXAM: PORTABLE CHEST 1 VIEW COMPARISON:  01/09/2021 FINDINGS: Right tunneled dialysis catheter is unchanged. Vascular stent is again noted along the aortic arch. Coronary artery stent also present. Postoperative changes of CABG. No new consolidation or edema. No pleural effusion or pneumothorax. Stable cardiomediastinal contours. IMPRESSION: No acute process in the chest. Electronically Signed   By: Macy Mis M.D.   On: 02/22/2021 13:24    Cardiac Studies   Cath  01/10/2021  There is mild left ventricular systolic dysfunction. The left ventricular ejection fraction is 50-55% by visual estimate.  LV end diastolic pressure is moderately elevated.  There is no aortic valve stenosis.  -------------------------------------------  Ost LM to Dist LM STENT is 95% stenosed. Ost Cx to Mid Cx STENT is 99% stenosed.  JAILED 1st Mrg-1 lesion is 99% stenosed. 1st Mrg-2 lesion is 100% stenosed.  Ost LAD to Prox LAD lesion is 100% stenosed.  Prox RCA lesion is 100% stenosed.  LIMA graft was visualized by non-selective angiography and is normal in caliber. Origin to Prox Graft STENT is 60% stenosed.  SeqSVG OM-LPL graft was not visualized due to known occlusion. Origin to Prox Graft lesion before 1st Mrg is 100% stenosed.  SVG-mRCA graft was visualized by angiography. Origin STENT is 20% stenosed.  Prox SVG-RCA Graft lesion is 75% stenosed.  A drug-eluting stent was successfully placed (overlapping previous stent) using a SYNERGY XD [id number]. Postdilated to 2.75 mm.  Post intervention, there is a 0% residual stenosis.  Mid RCA to Dist RCA STENT (placed through SVG) is 30% stenosed.   SUMMARY  Likely culprit lesion-75% ulcerated lesion in SVG-RCA ->  ? s/p successful DES PCI overlapping ostial stent with Synergy DES 2.5 mg 60 mm postdilated to 2.75 mm.  Severe native CAD with occluded ostial LAD, 95% ostial OM1 with extensive stent ISR in the LM-LCx with least 95% stenosis RCA CTO.  Patent LIMA-LAD-but unable to engage the left subclavian artery due to stent extending into the aorta.  Nonselective imaging reveals TIMI-3 flow in the LIMA graft, there does appear to be some in-stent restenosis at least 50% in the ostial stent.  Known occlusion of the SVG-OM-LPL,   Severe Systemic Hypertension with moderate to severely elevated LVEDP 22 mmHg   RECOMMENDATION  Return to nursing for ongoing care.  Consider noninvasive imaging such as Myoview to  evaluate for anterior ischemia, if warranted, would then probably require left brachial access to allow for LIMA angiography and possible PCI.  Needs aggressive respiratory medication with blood pressure control  Patient noted heartburn related chest discomfort along with nausea after being given additional 300 mg oral Plavix post PCI.    Patient Profile     65 y.o. female with PMH of CAD s/p CABG, ESRD on HD TThS, HTN, HLD, DM II and carotid artery disease s/p R CEA 2014 transferred from [facility] to [facility] with NSTEMI. Patient recently underwent cath on 01/10/2021, LIMA graft had 60%, 75%  ulcerated lesion in SVG-RCA treated with 2.5x34mm DES, unable engage LIMA-LAD due to stent extending into the aorta, known occluded SVG-OM-LPL, elevated LVEDP 22 mmHg. Recommended myoview to check for anterior ischemia. Since discharge patient continue to notice significant left sided chest pain under the left breast radiating to the left flank. She went to [facility] and was noted to have elevated troponin.  Assessment & Plan    1. NSTEMI  - will discuss with MD, will persistent intermittent chest pain since last PCI, would favor relook cath to look at the LIMA graft. Per cath report, "consider left brachial access to allow for LIMA angiography and possible PCI"  - concern of significant left subclavian artery stenosis despite prior stenting. R arm pressure 107/45, L arm 74/52.   2. CAD s/p CABG  3. ESRD on HD TThS: started on HD via tunneled catheter near the end of 2021, failed left arm AVF with left arm steal syndrome s/p ligation 09/15/2020, pending upcoming AV fistula creation near the end of this month  4. HTN: stable BP, known to have significant drop in BP during dialysis  5. HLD: 80mg  daily lipitor  6. DM II  7. Carotid artery disease s/p R CEA 2014  8. Left subclavian artery stenosis s/p stent 08/02/2020: likely has recurrent stenosis. Last U/S on 10/20/2020 showed patent stent with increase  velocity. Bilateral arm pressure today shows a 33 mmHg difference between the left arm and right arm pressure, L arm pressure noticeably lower. Concerning for worsening left subclavian artery stenosis.       For questions or updates, please contact [place] Please consult [email or web address] for contact info under        Signed, Almyra Deforest, [place]  02/23/2021, 2:40 PM

## 2021-02-23 NOTE — Progress Notes (Signed)
[place] for Heparin Indication: chest pain/ACS  Allergies  Allergen Reactions  . Penicillins Other (See Comments)    REACTION: Unknown, told as a child Has patient had a PCN reaction causing immediate rash, facial/tongue/throat swelling, SOB or lightheadedness with hypotension: Unknown Has patient had a PCN reaction causing severe rash involving mucus membranes or skin necrosis: Unknown Has patient had a PCN reaction that required hospitalization: Unknown Has patient had a PCN reaction occurring within the last 10 years: No If all of the above answers are "NO", then may proceed with Cephalosporin use.   . Beta Adrenergic Blockers Other (See Comments)    Hypotension    Patient Measurements: Height: 5\' 5"  (165.1 cm) Weight: 96.8 kg (213 lb 6.5 oz) (stood to scale) IBW/kg (Calculated) : 57 HEPARIN DW (KG): 79.5  Vital Signs: Temp: 99.2 F (37.3 C) (06/08 1315) Temp Source: Oral (06/08 1315) BP: 136/62 (06/08 1322) Pulse Rate: 82 (06/08 1248)  Labs: Recent Labs    02/22/21 1244 02/22/21 1503 02/22/21 1753 02/22/21 1955 02/22/21 2225 02/23/21 0446 02/23/21 0638 02/23/21 1425  HGB 10.9*  --   --   --   --   --   --  10.7*  HCT 34.3*  --   --   --   --   --   --  33.0*  PLT 194  --   --   --   --   --   --  162  HEPARINUNFRC  --   --   --   --  0.10*  --   --  0.16*  CREATININE 7.04*  --   --   --   --  7.23* 7.21*  --   TROPONINIHS 1,038* 1,104* 1,226* 1,277*  --   --   --   --     Estimated Creatinine Clearance: 9.3 mL/min (A) (by C-G formula based on SCr of 7.21 mg/dL (H)).   Medical History: Past Medical History:  Diagnosis Date  . Anemia   . Anxiety   . Asthma   . CAD (coronary artery disease)    Multivessel s/p CABG 2005, numerous PCIs since that time and documented graft disease  . Carotid artery disease (HCC)    R CEA  . ESRD on hemodialysis ([place])   . Essential hypertension   . Gout   . History of blood  transfusion   . Hyperlipidemia   . Hypothyroidism   . Myocardial infarction ([place])   . PAD (peripheral artery disease) ([place])    Dr. Kellie Simmering  . Pneumonia 09/2019, 11/2019  . S/P angioplasty with stent- DES to [facility] and to LIMA to LAD with DES 04/09/18.   04/10/2018  . SBO (small bowel obstruction) ([place]) 2011   Status post lysis of adhesions & hernia repair  . Sinus bradycardia   . Type 2 diabetes mellitus ([place])   . Umbilical hernia     Medications:  See med rec  Assessment: Patient has had Chest pain since yesterday. She has taken ntg x 3 prior to arrival. Also, she was unable to do dialysis treatment today due to pain.Has elevated troponins.  Medications reviewed and she is not on oral anticoagulant therapy. Pharmacy asked to start heparin  Heparin level this afternoon below goal level.  Per RN IV had been leaking and was just replaced a few minutes ago.  No overt bleeding or complications noted.  Goal of Therapy:  Heparin level 0.3-0.7 units/ml Monitor platelets by anticoagulation  protocol: Yes   Plan:  Increase IV heparin just a little to 1150 units/hr. Repeat heparin level in 8 hrs. Daily heparin level and CBC.  [place] [name], Roylene Reason, BCCP Clinical Pharmacist  02/23/2021 4:26 PM   [facility] pharmacy phone numbers are listed on [email or web address]

## 2021-02-23 NOTE — Progress Notes (Addendum)
PROGRESS NOTE    Donna Howe  QTM:[id number] DOB: [birth year] DOA: 02/22/2021 PCP: Curlene Labrum, MD    Brief Narrative:  Donna Howe is a 65 year old female with past medical history significant for CAD s/p PCI with DES x2 to RCA, ESRD on HD TTS, type 2 diabetes mellitus, essential hypertension, morbid obesity and recent cataract surgery who presented to the ED for evaluation of chest pain.  Onset for 24 hours, localized to her left upper breast region with radiation to her left arm.  Occurred at rest.  Chest pain improved with some lingual nitroglycerin tablets, otherwise no appreciable exacerbating or alleviating factors.  Also associated with shortness of breath, diaphoresis.  Denies nausea/vomiting, no dizziness or lightheadedness.  In the ED, temperature 98.5 F, HR 71, RR 16, BP 133/54, SPO2 97% on room air. Sodium 136, potassium 5.3, chloride 99, CO2 27, BUN 66, creatinine 7.04, glucose 189.  WBC 10.4, hemoglobin 10.9, platelets 194.  High sensitive troponin 1038> 1104.  Cova-19 PCR negative.  Influenza A/B PCR negative.Chest x-ray with no acute cardiopulmonary disease process.  EKG with normal sinus rhythm, rate 76, QTc 468, diffuse ST-T changes which are not certainly changed from previous EKG.  Cardiology was consulted.  Patient was transferred to [facility] for further evaluation.  TRH consulted for admission.   Assessment & Plan:   Principal Problem:   NSTEMI (non-ST elevated myocardial infarction) ([place]) Active Problems:   Obesity (BMI 30-39.9)   CAD (coronary artery disease)   Diabetes mellitus with ESRD (end-stage renal disease) ([place])   Hypothyroidism   Hyperkalemia   NSTEMI Hx CAD s/p PCI Patient presenting to the ED after acute onset chest pain with radiation to her left arm with associated diaphoresis, shortness of breath and during hemodialysis, with improvement of sublingual nitroglycerin.  Patient was noted to have elevated troponin with EKG  changes that are similar to previous in appearance.  Patient with history of CABG and recent angioplasty with stent placement April 2022 to RCA. --Cardiology consulted --Troponin 1038>1104>1226>1277 --TTE: Pending --Heparin drip --Aspirin 81 mg p.o. daily --Plavix 75 mg p.o. daily --Atorvastatin 80 mg p.o. daily --Ranolazine 500 mg p.o. nightly (states intolerant to BID dosing causing dizziness) --Continue monitor on telemetry  Hyperkalemia: Potassium 5.3 on admission.  Received Lokelma 10 g p.o. on 6/7. --HD today --Follow BMP daily --monitor on telemetry  Type 2 diabetes mellitus: Hemoglobin A1c 7.0.  Home regimen includes Novolin 70/30 24u [place] BID.  --Continue Novolin 70/30 at reduced dose 10u [place] BID --SSI for further coverage --CBG qAC/HS  ESRD on HD: --Nephrology following --Continue HD, plan for today  Anxiety: --Continue home Alprazolam 0.25 mg p.o. qAM,qPM and 0.5 mg nightly  Hypothyroidism: TSH 1.057 on 06/18/2020. --Continue levothyroxine 112 mcg p.o. daily  GERD: Continue PPI  COPD: Without acute exacerbation.  Not oxygen dependent at baseline. --Continue nebs as needed  Recent cataract surgery: Reports recent cataract surgery to left thigh by Dr.Hecker on 02/15/21.  --Completed permitted on eyedrops --Continue ketorolac 1 drop left eye 4 times daily x 21 days --Continue prednisolone eyedrops --Outpatient follow-up with ophthalmology as scheduled  Morbid obesity: Body mass index is 35.51 kg/m.  Discussed with patient needs for aggressive lifestyle changes/weight loss as this complicates all facets of care.  Outpatient follow-up with PCP.    DVT prophylaxis: Heparin drip    Code Status: Full Code Family Communication: no family present at bedside this morning  Disposition Plan:  Level of care: Telemetry Cardiac Status is:  Inpatient  Remains inpatient appropriate because:Ongoing diagnostic testing needed not appropriate for outpatient work up, Unsafe  d/c plan, IV treatments appropriate due to intensity of illness or inability to take PO and Inpatient level of care appropriate due to severity of illness   Dispo: The patient is from: Home              Anticipated d/c is to: Home              Patient currently is not medically stable to d/c.   Difficult to place patient No     Consultants:   Cardiology  Nephrology  Procedures:   TTE: Pending  Antimicrobials:   None   Subjective: Patient seen and examined, currently receiving HD.  Reports currently chest pain-free.  Denies shortness of breath or diaphoresis currently.  No other complaints or concerns at this time.  Denies headache, no dizziness, no palpitations, no fever/chills/night sweats, no nausea/vomiting/diarrhea, no abdominal pain, no weakness, no fatigue.  No acute events overnight per nursing staff.  Objective: Vitals:   02/23/21 1059 02/23/21 1128 02/23/21 1200 02/23/21 1248  BP: (!) 149/71 (!) 143/54 132/65 (!) 142/65  Pulse: 76 78 78 82  Resp:    18  Temp:    98 F (36.7 C)  TempSrc:    Oral  SpO2:    98%  Weight:    96.8 kg  Height:        Intake/Output Summary (Last 24 hours) at 02/23/2021 1309 Last data filed at 02/23/2021 1231 Gross per 24 hour  Intake 581.05 ml  Output 2942 ml  Net -2360.95 ml   Filed Weights   02/23/21 0433 02/23/21 0825 02/23/21 1248  Weight: 98.6 kg 99.7 kg 96.8 kg    Examination:  General exam: Appears calm and comfortable  Respiratory system: Clear to auscultation. Respiratory effort normal.  On room air. Cardiovascular system: S1 & S2 heard, RRR. No JVD, murmurs, rubs, gallops or clicks. No pedal edema.  HD catheter noted right chest. Gastrointestinal system: Abdomen is nondistended, soft and nontender. No organomegaly or masses felt. Normal bowel sounds heard. Central nervous system: Alert and oriented. No focal neurological deficits. Extremities: Symmetric 5 x 5 power. Skin: No rashes, lesions or ulcers Psychiatry:  Judgement and insight appear normal. Mood & affect appropriate.     Data Reviewed: I have personally reviewed following labs and imaging studies  CBC: Recent Labs  Lab 02/22/21 1244  WBC 10.4  HGB 10.9*  HCT 34.3*  MCV 104.3*  PLT 983   Basic Metabolic Panel: Recent Labs  Lab 02/22/21 1244 02/23/21 0446 02/23/21 0638  NA 136 136 134*  K 5.3* 5.6* 5.4*  CL 99 99 98  CO2 27 21* 26  GLUCOSE 189* 176* 235*  BUN 66* 69* 69*  CREATININE 7.04* 7.23* 7.21*  CALCIUM 9.2 9.3 8.7*  PHOS  --   --  6.5*   GFR: Estimated Creatinine Clearance: 9.3 mL/min (A) (by C-G formula based on SCr of 7.21 mg/dL (H)). Liver Function Tests: Recent Labs  Lab 02/23/21 3825  ALBUMIN 2.7*   No results for input(s): LIPASE, AMYLASE in the last 168 hours. No results for input(s): AMMONIA in the last 168 hours. Coagulation Profile: No results for input(s): INR, PROTIME in the last 168 hours. Cardiac Enzymes: No results for input(s): CKTOTAL, CKMB, CKMBINDEX, TROPONINI in the last 168 hours. BNP (last 3 results) No results for input(s): PROBNP in the last 8760 hours. HbA1C: No results for input(s): HGBA1C in  the last 72 hours. CBG: Recent Labs  Lab 02/22/21 1723 02/23/21 0506 02/23/21 0644 02/23/21 0757  GLUCAP 95 178* 179* 209*   Lipid Profile: Recent Labs    02/23/21 0446  CHOL 168  HDL 41  LDLCALC 76  TRIG 257*  CHOLHDL 4.1   Thyroid Function Tests: No results for input(s): TSH, T4TOTAL, FREET4, T3FREE, THYROIDAB in the last 72 hours. Anemia Panel: No results for input(s): VITAMINB12, FOLATE, FERRITIN, TIBC, IRON, RETICCTPCT in the last 72 hours. Sepsis Labs: No results for input(s): PROCALCITON, LATICACIDVEN in the last 168 hours.  Recent Results (from the past 240 hour(s))  Resp Panel by RT-PCR (Flu A&B, Covid) Nasopharyngeal Swab     Status: None   Collection Time: 02/22/21 12:04 PM   Specimen: Nasopharyngeal Swab; Nasopharyngeal(NP) swabs in vial transport medium   Result Value Ref Range Status   SARS Coronavirus 2 by RT PCR NEGATIVE NEGATIVE Final    Comment: (NOTE) SARS-CoV-2 target nucleic acids are NOT DETECTED.  The SARS-CoV-2 RNA is generally detectable in upper respiratory specimens during the acute phase of infection. The lowest concentration of SARS-CoV-2 viral copies this assay can detect is 138 copies/mL. A negative result does not preclude SARS-Cov-2 infection and should not be used as the sole basis for treatment or other patient management decisions. A negative result may occur with  improper specimen collection/handling, submission of specimen other than nasopharyngeal swab, presence of viral mutation(s) within the areas targeted by this assay, and inadequate number of viral copies(<138 copies/mL). A negative result must be combined with clinical observations, patient history, and epidemiological information. The expected result is Negative.  Fact Sheet for Patients:  EntrepreneurPulse.com.au  Fact Sheet for Healthcare Providers:  IncredibleEmployment.be  This test is no t yet approved or cleared by the [place] FDA and  has been authorized for detection and/or diagnosis of SARS-CoV-2 by FDA under an Emergency Use Authorization (EUA). This EUA will remain  in effect (meaning this test can be used) for the duration of the COVID-19 declaration under Section 564(b)(1) of the Act, 21 U.S.C.section 360bbb-3(b)(1), unless the authorization is terminated  or revoked sooner.       Influenza A by PCR NEGATIVE NEGATIVE Final   Influenza B by PCR NEGATIVE NEGATIVE Final    Comment: (NOTE) The Xpert Xpress SARS-CoV-2/FLU/RSV plus assay is intended as an aid in the diagnosis of influenza from Nasopharyngeal swab specimens and should not be used as a sole basis for treatment. Nasal washings and aspirates are unacceptable for Xpert Xpress SARS-CoV-2/FLU/RSV testing.  Fact Sheet for  Patients: EntrepreneurPulse.com.au  Fact Sheet for Healthcare Providers: IncredibleEmployment.be  This test is not yet approved or cleared by the [place] FDA and has been authorized for detection and/or diagnosis of SARS-CoV-2 by FDA under an Emergency Use Authorization (EUA). This EUA will remain in effect (meaning this test can be used) for the duration of the COVID-19 declaration under Section 564(b)(1) of the Act, 21 U.S.C. section 360bbb-3(b)(1), unless the authorization is terminated or revoked.  Performed at [facility], [address] [place], [place] [address]          Radiology Studies: [facility] Chest [facility] 1 View  Result Date: 02/22/2021 CLINICAL DATA:  Chest pain EXAM: PORTABLE CHEST 1 VIEW COMPARISON:  01/09/2021 FINDINGS: Right tunneled dialysis catheter is unchanged. Vascular stent is again noted along the aortic arch. Coronary artery stent also present. Postoperative changes of CABG. No new consolidation or edema. No pleural effusion or pneumothorax. Stable cardiomediastinal contours.  IMPRESSION: No acute process in the chest. Electronically Signed   By: Macy Mis M.D.   On: 02/22/2021 13:24        Scheduled Meds: . [START ON 02/24/2021] allopurinol  100 mg Oral Q T,Th,Sa-HD  . ALPRAZolam  0.25 mg Oral BID AC  . ALPRAZolam  0.5 mg Oral QHS  . aspirin EC  81 mg Oral Daily  . atorvastatin  80 mg Oral QHS  . calcitRIOL  1 mcg Oral QODAY  . Chlorhexidine Gluconate Cloth  6 each Topical Q0600  . Chlorhexidine Gluconate Cloth  6 each Topical Q0600  . Chlorhexidine Gluconate Cloth  6 each Topical Q0600  . clopidogrel  75 mg Oral Daily  . ferric citrate  420 mg Oral TID WC  . gabapentin  400 mg Oral BID  . insulin aspart  0-6 Units Subcutaneous TID WC  . ketorolac  1 drop Left Eye QID  . levothyroxine  112 mcg Oral QAC breakfast  . pantoprazole  40 mg Oral BID  . prednisoLONE acetate  1 drop Left Eye TID   Followed by  .  [START ON 03/02/2021] prednisoLONE acetate  1 drop Left Eye BID   Followed by  . [START ON 03/09/2021] prednisoLONE acetate  1 drop Left Eye Daily  . ranolazine  500 mg Oral QHS  . [START ON 02/27/2021] Vitamin D (Ergocalciferol)  50,000 Units Oral Q Sun   Continuous Infusions: . sodium chloride    . sodium chloride    . heparin 1,100 Units/hr (02/23/21 0730)     LOS: 1 day    Time spent: 39 minutes spent on chart review, discussion with nursing staff, consultants, updating family and interview/physical exam; more than 50% of that time was spent in counseling and/or coordination of care.    My [place] J [place] DO [facility] Available via Epic secure chat 7am-7pm After these hours, please refer to coverage provider listed on [email or web address] 02/23/2021, 1:09 PM

## 2021-02-23 NOTE — Progress Notes (Signed)
Going to dialysis

## 2021-02-23 NOTE — Progress Notes (Addendum)
[facility] NEPHROLOGY PROGRESS NOTE  Assessment/ Plan: Pt is a 65 y.o. yo female  with ESRD on HD TTS, CAD s/p CABG 2005 and  PCI 12/2020, HTN, HL, PAD, DM who is currently admitted for NSTEMI after presenting with CP and dyspnea and is seen by nephrology for evaluation and management of ESRD.   Outpt HD orders: [facility] TTS 2nd shift 180 NR, 400/500, EDW 92.5, 4hrs, 2K/2.5Ca Calcitriol 1 TIW 6/2 PTH 129, 01/2021 phos 6.5, Hb 12 Recent post weights 92.3kg - 94.4kg.  #NSTEMI: Presented with chest pain.  Seen by cardiologist and currently on IV heparin.  Further plan per cardiologist.  # ESRD receiving dialysis today off schedule because of missing HD yesterday.  UF around 3 L if tolerated by BP.  Plan for regular HD tomorrow to resume her schedule.  # Anemia: Hemoglobin at goal.  Not on ESA.  # Secondary hyperparathyroidism: Continue Auryxia, calcitriol.  Monitor calcium phosphorus level.  # HTN/volume: Blood pressure acceptable.  Volume management dialysis.  #Hyperkalemia: Manage with dialysis.  No need for Lokelma.  Subjective: Seen and examined dialysis unit.  Reports some vague chest heaviness but better than yesterday.  Denies shortness of breath, nausea vomiting.  Tolerating dialysis well so far. Objective Vital signs in last 24 hours: Vitals:   02/23/21 0825 02/23/21 0830 02/23/21 0833 02/23/21 0900  BP: (!) 164/58 (!) 151/54 (!) 164/69 111/81  Pulse: 71 71 70 70  Resp: 18     Temp: 98.8 F (37.1 C)     TempSrc: Oral     SpO2: 98%     Weight: 99.7 kg     Height:       Weight change:   Intake/Output Summary (Last 24 hours) at 02/23/2021 0927 Last data filed at 02/23/2021 0300 Gross per 24 hour  Intake 581.05 ml  Output --  Net 581.05 ml       Labs: Basic Metabolic Panel: Recent Labs  Lab 02/22/21 1244 02/23/21 0446  NA 136 136  K 5.3* 5.6*  CL 99 99  CO2 27 21*  GLUCOSE 189* 176*  BUN 66* 69*  CREATININE 7.04* 7.23*  CALCIUM 9.2 9.3   Liver  Function Tests: No results for input(s): AST, ALT, ALKPHOS, BILITOT, PROT, ALBUMIN in the last 168 hours. No results for input(s): LIPASE, AMYLASE in the last 168 hours. No results for input(s): AMMONIA in the last 168 hours. CBC: Recent Labs  Lab 02/22/21 1244  WBC 10.4  HGB 10.9*  HCT 34.3*  MCV 104.3*  PLT 194   Cardiac Enzymes: No results for input(s): CKTOTAL, CKMB, CKMBINDEX, TROPONINI in the last 168 hours. CBG: Recent Labs  Lab 02/22/21 1723 02/23/21 0506 02/23/21 0644 02/23/21 0757  GLUCAP 95 178* 179* 209*    Iron Studies: No results for input(s): IRON, TIBC, TRANSFERRIN, FERRITIN in the last 72 hours. Studies/Results: DG Chest Port 1 View  Result Date: 02/22/2021 CLINICAL DATA:  Chest pain EXAM: PORTABLE CHEST 1 VIEW COMPARISON:  01/09/2021 FINDINGS: Right tunneled dialysis catheter is unchanged. Vascular stent is again noted along the aortic arch. Coronary artery stent also present. Postoperative changes of CABG. No new consolidation or edema. No pleural effusion or pneumothorax. Stable cardiomediastinal contours. IMPRESSION: No acute process in the chest. Electronically Signed   By: Macy Mis M.D.   On: 02/22/2021 13:24    Medications: Infusions: . sodium chloride    . sodium chloride    . heparin 1,100 Units/hr (02/23/21 0730)    Scheduled Medications: Marland Kitchen [  START ON 02/24/2021] allopurinol  100 mg Oral Q T,Th,Sa-HD  . ALPRAZolam  0.25 mg Oral BID AC  . ALPRAZolam  0.5 mg Oral QHS  . aspirin EC  81 mg Oral Daily  . atorvastatin  80 mg Oral QHS  . brimonidine  1 drop Left Eye TID  . calcitRIOL  1 mcg Oral QODAY  . Chlorhexidine Gluconate Cloth  6 each Topical Q0600  . Chlorhexidine Gluconate Cloth  6 each Topical Q0600  . clopidogrel  75 mg Oral Daily  . ferric citrate  420 mg Oral TID WC  . gabapentin  400 mg Oral BID  . insulin aspart  0-6 Units Subcutaneous TID WC  . levothyroxine  112 mcg Oral QAC breakfast  . pantoprazole  40 mg Oral BID  .  ranolazine  500 mg Oral QHS  . sodium zirconium cyclosilicate  10 g Oral Daily  . [START ON 02/27/2021] Vitamin D (Ergocalciferol)  50,000 Units Oral Q Sun    have reviewed scheduled and prn medications.  Physical Exam: General:NAD, comfortable Heart:RRR, s1s2 nl Lungs:clear b/l, no crackle Abdomen:soft, Non-tender, non-distended Extremities:No edema Dialysis Access: Right IJ TDC site clean.  Donna [facility] 02/23/2021,9:27 AM  LOS: 1 day

## 2021-02-24 ENCOUNTER — Other Ambulatory Visit: Payer: Self-pay

## 2021-02-24 ENCOUNTER — Encounter (HOSPITAL_COMMUNITY)
Admission: EM | Disposition: A | Discharge status: Home / Self Care | Payer: Self-pay | Source: Home / Self Care | Attending: Internal Medicine

## 2021-02-24 ENCOUNTER — Other Ambulatory Visit (HOSPITAL_COMMUNITY): Payer: Medicare HMO

## 2021-02-24 DIAGNOSIS — I214 Non-ST elevation (NSTEMI) myocardial infarction: Secondary | ICD-10-CM

## 2021-02-24 DIAGNOSIS — Y712 Prosthetic and other implants, materials and accessory cardiovascular devices associated with adverse incidents: Secondary | ICD-10-CM

## 2021-02-24 DIAGNOSIS — I25810 Atherosclerosis of coronary artery bypass graft(s) without angina pectoris: Secondary | ICD-10-CM

## 2021-02-24 DIAGNOSIS — T82856A Stenosis of peripheral vascular stent, initial encounter: Secondary | ICD-10-CM

## 2021-02-24 HISTORY — PX: PERIPHERAL VASCULAR INTERVENTION: CATH118257

## 2021-02-24 HISTORY — PX: LEFT HEART CATH AND CORS/GRAFTS ANGIOGRAPHY: CATH118250

## 2021-02-24 HISTORY — PX: CORONARY/GRAFT ACUTE MI REVASCULARIZATION: CATH118305

## 2021-02-24 LAB — CBC
HCT: 29.3 % — ABNORMAL LOW (ref 36.0–46.0)
Hemoglobin: 9.4 g/dL — ABNORMAL LOW (ref 12.0–15.0)
MCH: 32.9 pg (ref 26.0–34.0)
MCHC: 32.1 g/dL (ref 30.0–36.0)
MCV: 102.4 fL — ABNORMAL HIGH (ref 80.0–100.0)
Platelets: 148 10*3/uL — ABNORMAL LOW (ref 150–400)
RBC: 2.86 MIL/uL — ABNORMAL LOW (ref 3.87–5.11)
RDW: 14 % (ref 11.5–15.5)
WBC: 7.8 10*3/uL (ref 4.0–10.5)
nRBC: 0 % (ref 0.0–0.2)

## 2021-02-24 LAB — BASIC METABOLIC PANEL
Anion gap: 7 (ref 5–15)
BUN: 29 mg/dL — ABNORMAL HIGH (ref 8–23)
CO2: 27 mmol/L (ref 22–32)
Calcium: 8.8 mg/dL — ABNORMAL LOW (ref 8.9–10.3)
Chloride: 98 mmol/L (ref 98–111)
Creatinine, Ser: 4.13 mg/dL — ABNORMAL HIGH (ref 0.44–1.00)
GFR, Estimated: 12 mL/min — ABNORMAL LOW (ref >60)
Glucose, Bld: 208 mg/dL — ABNORMAL HIGH (ref 70–99)
Potassium: 4.7 mmol/L (ref 3.5–5.1)
Sodium: 132 mmol/L — ABNORMAL LOW (ref 135–145)

## 2021-02-24 LAB — POCT ACTIVATED CLOTTING TIME
Activated Clotting Time: 295 seconds
Activated Clotting Time: 306 seconds
Activated Clotting Time: 353 seconds
Activated Clotting Time: 289 seconds
Activated Clotting Time: 248 seconds
Activated Clotting Time: 214 seconds
Activated Clotting Time: 190 seconds

## 2021-02-24 LAB — HEPARIN LEVEL (UNFRACTIONATED)
Heparin Unfractionated: 0.12 IU/mL — ABNORMAL LOW (ref 0.30–0.70)
Heparin Unfractionated: 0.32 IU/mL (ref 0.30–0.70)

## 2021-02-24 LAB — HEMOGLOBIN A1C
Hgb A1c MFr Bld: 6.9 % — ABNORMAL HIGH (ref 4.8–5.6)
Mean Plasma Glucose: 151 mg/dL

## 2021-02-24 LAB — GLUCOSE, CAPILLARY
Glucose-Capillary: 185 mg/dL — ABNORMAL HIGH (ref 70–99)
Glucose-Capillary: 86 mg/dL (ref 70–99)
Glucose-Capillary: 203 mg/dL — ABNORMAL HIGH (ref 70–99)

## 2021-02-24 SURGERY — LEFT HEART CATH AND CORS/GRAFTS ANGIOGRAPHY
Anesthesia: LOCAL

## 2021-02-24 MED ORDER — ACETAMINOPHEN 325 MG PO TABS: 650.0000 mg | ORAL_TABLET | ORAL | Status: DC | PRN

## 2021-02-24 MED ORDER — MORPHINE SULFATE (PF) 2 MG/ML IV SOLN
INTRAVENOUS | Status: AC
  Filled 2021-02-24: qty 1

## 2021-02-24 MED ORDER — HEPARIN SODIUM (PORCINE) 1000 UNIT/ML IJ SOLN
INTRAMUSCULAR | Status: AC
  Filled 2021-02-24: qty 1

## 2021-02-24 MED ORDER — SODIUM CHLORIDE 0.9 % IV SOLN: INTRAVENOUS | Status: DC

## 2021-02-24 MED ORDER — HEPARIN (PORCINE) IN NACL 1000-0.9 UT/500ML-% IV SOLN
INTRAVENOUS | Status: DC | PRN
  Administered 2021-02-24 (×2): 500 mL

## 2021-02-24 MED ORDER — MIDAZOLAM HCL 2 MG/2ML IJ SOLN
INTRAMUSCULAR | Status: DC | PRN
  Administered 2021-02-24 (×3): 1 mg via INTRAVENOUS

## 2021-02-24 MED ORDER — ASPIRIN 81 MG PO CHEW
81.0000 mg | CHEWABLE_TABLET | ORAL | Status: AC
  Administered 2021-02-24: 81 mg via ORAL
  Filled 2021-02-24: qty 1

## 2021-02-24 MED ORDER — SODIUM CHLORIDE 0.9 % IV SOLN: 250.0000 mL | INTRAVENOUS | Status: DC | PRN

## 2021-02-24 MED ORDER — LABETALOL HCL 5 MG/ML IV SOLN: 10.0000 mg | INTRAVENOUS | Status: AC | PRN

## 2021-02-24 MED ORDER — MIDAZOLAM HCL 2 MG/2ML IJ SOLN
INTRAMUSCULAR | Status: AC
  Filled 2021-02-24: qty 2

## 2021-02-24 MED ORDER — MORPHINE SULFATE (PF) 2 MG/ML IV SOLN
2.0000 mg | Freq: Once | INTRAVENOUS | Status: AC
  Administered 2021-02-24: 2 mg via INTRAVENOUS

## 2021-02-24 MED ORDER — FENTANYL CITRATE (PF) 100 MCG/2ML IJ SOLN
INTRAMUSCULAR | Status: AC
  Filled 2021-02-24: qty 2

## 2021-02-24 MED ORDER — HEPARIN SODIUM (PORCINE) 1000 UNIT/ML IJ SOLN
INTRAMUSCULAR | Status: DC | PRN
  Administered 2021-02-24: 6000 [IU] via INTRAVENOUS
  Administered 2021-02-24: 3000 [IU] via INTRAVENOUS
  Administered 2021-02-24: 2000 [IU] via INTRAVENOUS
  Administered 2021-02-24: 3000 [IU] via INTRAVENOUS

## 2021-02-24 MED ORDER — ASPIRIN 81 MG PO CHEW: 81.0000 mg | CHEWABLE_TABLET | Freq: Every day | ORAL | Status: DC

## 2021-02-24 MED ORDER — LIDOCAINE HCL (PF) 1 % IJ SOLN
INTRAMUSCULAR | Status: AC
  Filled 2021-02-24: qty 30

## 2021-02-24 MED ORDER — SODIUM CHLORIDE 0.9% FLUSH: 3.0000 mL | Freq: Two times a day (BID) | INTRAVENOUS | Status: DC

## 2021-02-24 MED ORDER — MORPHINE SULFATE (PF) 2 MG/ML IV SOLN
2.0000 mg | Freq: Once | INTRAVENOUS | Status: AC
Start: 2021-02-24 — End: 2021-02-24
  Administered 2021-02-24: 2 mg via INTRAVENOUS

## 2021-02-24 MED ORDER — VERAPAMIL HCL 2.5 MG/ML IV SOLN
INTRAVENOUS | Status: AC
  Filled 2021-02-24: qty 2

## 2021-02-24 MED ORDER — SODIUM CHLORIDE 0.9% FLUSH: 3.0000 mL | INTRAVENOUS | Status: DC | PRN

## 2021-02-24 MED ORDER — HEPARIN (PORCINE) IN NACL 1000-0.9 UT/500ML-% IV SOLN
INTRAVENOUS | Status: AC
  Filled 2021-02-24: qty 500

## 2021-02-24 MED ORDER — HEPARIN (PORCINE) IN NACL 1000-0.9 UT/500ML-% IV SOLN
INTRAVENOUS | Status: DC | PRN
  Administered 2021-02-24: 500 mL

## 2021-02-24 MED ORDER — IOHEXOL 350 MG/ML SOLN
INTRAVENOUS | Status: DC | PRN
  Administered 2021-02-24: 10 mL via INTRA_ARTERIAL

## 2021-02-24 MED ORDER — SODIUM CHLORIDE 0.9% FLUSH
3.0000 mL | Freq: Two times a day (BID) | INTRAVENOUS | Status: DC
  Administered 2021-02-25: 3 mL via INTRAVENOUS

## 2021-02-24 MED ORDER — HYDRALAZINE HCL 20 MG/ML IJ SOLN: 10.0000 mg | INTRAMUSCULAR | Status: AC | PRN

## 2021-02-24 MED ORDER — HEPARIN (PORCINE) IN NACL 1000-0.9 UT/500ML-% IV SOLN
INTRAVENOUS | Status: AC
  Filled 2021-02-24: qty 1000

## 2021-02-24 MED ORDER — CLOPIDOGREL BISULFATE 75 MG PO TABS
75.0000 mg | ORAL_TABLET | Freq: Every day | ORAL | Status: DC
  Administered 2021-02-25: 75 mg via ORAL
  Filled 2021-02-24: qty 1

## 2021-02-24 MED ORDER — IOHEXOL 350 MG/ML SOLN
INTRAVENOUS | Status: DC | PRN
  Administered 2021-02-24: 125 mL via INTRA_ARTERIAL

## 2021-02-24 MED ORDER — FENTANYL CITRATE (PF) 100 MCG/2ML IJ SOLN
INTRAMUSCULAR | Status: DC | PRN
  Administered 2021-02-24 (×3): 25 ug via INTRAVENOUS

## 2021-02-24 MED ORDER — LIDOCAINE HCL (PF) 1 % IJ SOLN
INTRAMUSCULAR | Status: DC | PRN
  Administered 2021-02-24: 5 mL via SUBCUTANEOUS

## 2021-02-24 MED ORDER — HEPARIN BOLUS VIA INFUSION
2000.0000 [IU] | Freq: Once | INTRAVENOUS | Status: AC
  Administered 2021-02-24: 2000 [IU] via INTRAVENOUS
  Filled 2021-02-24: qty 2000

## 2021-02-24 MED ORDER — ONDANSETRON HCL 4 MG/2ML IJ SOLN
4.0000 mg | Freq: Four times a day (QID) | INTRAMUSCULAR | Status: DC | PRN
  Administered 2021-02-24 – 2021-02-25 (×2): 4 mg via INTRAVENOUS

## 2021-02-24 MED ORDER — VERAPAMIL HCL 2.5 MG/ML IV SOLN
INTRAVENOUS | Status: DC | PRN
  Administered 2021-02-24: 10 mL via INTRA_ARTERIAL

## 2021-02-24 MED ORDER — ONDANSETRON HCL 4 MG/2ML IJ SOLN
INTRAMUSCULAR | Status: AC
  Filled 2021-02-24: qty 2

## 2021-02-24 SURGICAL SUPPLY — 37 items
BALLN MUSTANG 4X40X135 (BALLOONS) ×3
BALLN MUSTANG 6X20X135 (BALLOONS) ×3
BALLN SCOREFLEX 3.0X10 (BALLOONS) ×3
BALLN STERLING OTW 4X20X135 (BALLOONS) ×3
BALLN STERLING OTW 5X20X135 (BALLOONS) ×3
BALLOON MUSTANG 4X40X135 (BALLOONS) IMPLANT
BALLOON MUSTANG 6X20X135 (BALLOONS) IMPLANT
BALLOON SCOREFLEX 3.0X10 (BALLOONS) IMPLANT
BALLOON STERLING OTW 4X20X135 (BALLOONS) IMPLANT
BALLOON STERLING OTW 5X20X135 (BALLOONS) IMPLANT
CATH ANGIO 5F BER2 65CM (CATHETERS) ×1 IMPLANT
CATH EXPO 5F MPA-1 (CATHETERS) ×1 IMPLANT
CATH INFINITI 4FR JL 4.0 (CATHETERS) ×1 IMPLANT
CATH INFINITI 5 FR IM (CATHETERS) ×1 IMPLANT
CATH INFINITI 5FR MULTPACK ANG (CATHETERS) ×1 IMPLANT
CATH QUICKCROSS .035X135CM (MICROCATHETER) ×1 IMPLANT
CATHETER LAUNCHER 6FR MP1 (CATHETERS) ×1 IMPLANT
GLIDESHEATH SLEND SS 6F .021 (SHEATH) ×1 IMPLANT
GUIDE CATH VISTA IMA 6F (CATHETERS) ×1 IMPLANT
GUIDEWIRE INQWIRE 1.5J.035X260 (WIRE) IMPLANT
INQWIRE 1.5J .035X260CM (WIRE) ×3
KIT ENCORE 26 ADVANTAGE (KITS) ×1 IMPLANT
KIT HEART LEFT (KITS) ×3 IMPLANT
PACK CARDIAC CATHETERIZATION (CUSTOM PROCEDURE TRAY) ×3 IMPLANT
SHEATH PINNACLE ST 7F 45CM (SHEATH) ×1 IMPLANT
SHEATH PROBE COVER 6X72 (BAG) ×1 IMPLANT
SHIELD RADPAD SCOOP 12X17 (MISCELLANEOUS) ×1 IMPLANT
STENT SYNERGY XD 3.50X16 (Permanent Stent) IMPLANT
STENT VIABAHN VBX 7X19X80 (Permanent Stent) ×1 IMPLANT
SYNERGY XD 3.50X16 (Permanent Stent) ×3 IMPLANT
TRANSDUCER W/STOPCOCK (MISCELLANEOUS) ×3 IMPLANT
TUBING CIL FLEX 10 FLL-RA (TUBING) ×3 IMPLANT
VALVE COPILOT STAT (MISCELLANEOUS) ×1 IMPLANT
WIRE ASAHI PROWATER 180CM (WIRE) ×3 IMPLANT
WIRE G V18X300CM (WIRE) ×1 IMPLANT
WIRE HI TORQ VERSACORE-J 145CM (WIRE) ×1 IMPLANT
WIRE ROSEN-J .035X260CM (WIRE) ×1 IMPLANT

## 2021-02-24 NOTE — Progress Notes (Signed)
Site area: left brachial arterial sheath pulled by Alison Murray Site Prior to Removal:  Level 1 hematoma above insertion site w/bruising Pressure Applied For: 30 minutes Manual:   yes Patient Status During Pull:  stable Post Pull Site:  Level 1; hematoma mostly resolved and soft   Post Pull Instructions Given:  yes Post Pull Pulses Present: left radial pulse dopplered pre and post pull Dressing Applied:  gauze and tegaderm Bedrest begins @ 1810 Comments:

## 2021-02-24 NOTE — Progress Notes (Signed)
Level 1 hematoma just above left brachial sheath insertion site approx 3cm wide w/bruising. C/O pain; Dr. Irish Lack made aware; medicated for pain. ACT 190 at 1655; permission given to pull sheath.

## 2021-02-24 NOTE — H&P (View-Only) (Signed)
Progress Note  Patient Name: Donna Howe Date of Encounter: 02/24/2021  [facility] Cardiologist: Rozann Lesches, MD  Subjective   Had a recurrent chest pain last night which resolved with sublingual nitroglycerin x1.  Currently complaining of mild left shoulder achiness.  Denies breathing issue.  Inpatient Medications    Scheduled Meds:  allopurinol  100 mg Oral Q T,Th,Sa-HD   ALPRAZolam  0.25 mg Oral BID AC   ALPRAZolam  0.5 mg Oral QHS   aspirin EC  81 mg Oral Daily   atorvastatin  80 mg Oral QHS   calcitRIOL  1 mcg Oral QODAY   Chlorhexidine Gluconate Cloth  6 each Topical Q0600   Chlorhexidine Gluconate Cloth  6 each Topical Q0600   Chlorhexidine Gluconate Cloth  6 each Topical Q0600   clopidogrel  75 mg Oral Daily   ferric citrate  420 mg Oral TID WC   gabapentin  400 mg Oral BID   insulin aspart  0-6 Units Subcutaneous TID WC   insulin aspart protamine- aspart  10 Units Subcutaneous BID WC   ketorolac  1 drop Left Eye QID   levothyroxine  112 mcg Oral QAC breakfast   pantoprazole  40 mg Oral BID   prednisoLONE acetate  1 drop Left Eye TID   Followed by   Derrill Memo ON 03/02/2021] prednisoLONE acetate  1 drop Left Eye BID   Followed by   Derrill Memo ON 03/09/2021] prednisoLONE acetate  1 drop Left Eye Daily   ranolazine  500 mg Oral QHS   sodium chloride flush  3 mL Intravenous Q12H   [START ON 02/27/2021] Vitamin D (Ergocalciferol)  50,000 Units Oral Q Sun   Continuous Infusions:  sodium chloride     sodium chloride 10 mL/hr at 02/24/21 0655   heparin 1,400 Units/hr (02/24/21 0342)   PRN Meds: sodium chloride, acetaminophen, ipratropium-albuterol, nitroGLYCERIN, ondansetron (ZOFRAN) IV, sodium chloride flush   Vital Signs    Vitals:   02/23/21 1322 02/23/21 1745 02/23/21 2013 02/24/21 0525  BP: 136/62 (!) 114/48 (!) 137/54 (!) 120/40  Pulse:  67 72 66  Resp:    17  Temp:   98.7 F (37.1 C) 98.7 F (37.1 C)  TempSrc:   Oral Oral  SpO2:  97% 95% 98%   Weight:      Height:        Intake/Output Summary (Last 24 hours) at 02/24/2021 0854 Last data filed at 02/24/2021 0248 Gross per 24 hour  Intake 546.13 ml  Output 2942 ml  Net -2395.87 ml   Last 3 Weights 02/23/2021 02/23/2021 02/23/2021  Weight (lbs) 213 lb 6.5 oz 219 lb 12.8 oz 217 lb 6.4 oz  Weight (kg) 96.8 kg 99.7 kg 98.612 kg      Telemetry    SR with PVCs- Personally Reviewed  ECG    Sinus rhythm with ST abnormality in lateral leads- Personally Reviewed  Physical Exam   GEN: No acute distress.   Neck: No JVD Cardiac: RRR, no murmurs, rubs, or gallops.  Respiratory: Clear to auscultation bilaterally. GI: Soft, nontender, non-distended  MS: No edema; No deformity. Neuro:  Nonfocal  Psych: Normal affect   Labs    High Sensitivity Troponin:   Recent Labs  Lab 02/22/21 1244 02/22/21 1503 02/22/21 1753 02/22/21 1955  TROPONINIHS 1,038* 1,104* 1,226* 1,277*      Chemistry Recent Labs  Lab 02/23/21 0446 02/23/21 0638 02/24/21 0106  NA 136 134* 132*  K 5.6* 5.4* 4.7  CL 99 98  98  CO2 21* 26 27  GLUCOSE 176* 235* 208*  BUN 69* 69* 29*  CREATININE 7.23* 7.21* 4.13*  CALCIUM 9.3 8.7* 8.8*  ALBUMIN  --  2.7*  --   GFRNONAA 6* 6* 12*  ANIONGAP 16* 10 7     Hematology Recent Labs  Lab 02/22/21 1244 02/23/21 1425 02/24/21 0106  WBC 10.4 9.1 7.8  RBC 3.29* 3.23* 2.86*  HGB 10.9* 10.7* 9.4*  HCT 34.3* 33.0* 29.3*  MCV 104.3* 102.2* 102.4*  MCH 33.1 33.1 32.9  MCHC 31.8 32.4 32.1  RDW 14.0 13.7 14.0  PLT 194 162 148*      Radiology    DG Chest Port 1 View  Result Date: 02/22/2021 CLINICAL DATA:  Chest pain EXAM: PORTABLE CHEST 1 VIEW COMPARISON:  01/09/2021 FINDINGS: Right tunneled dialysis catheter is unchanged. Vascular stent is again noted along the aortic arch. Coronary artery stent also present. Postoperative changes of CABG. No new consolidation or edema. No pleural effusion or pneumothorax. Stable cardiomediastinal contours. IMPRESSION: No  acute process in the chest. Electronically Signed   By: Macy Mis M.D.   On: 02/22/2021 13:24    Cardiac Studies   Pending cardiac catheterization  Patient Profile     65 y.o. female with hx of CAD s/p CABG in 2004 and multiple stent procedures since then including stent of ostial LIMA to LAD and more recently SVG to RCA in April  2022 ESRD on HD TThS, HTN, HLD, DM II, Left subclavian artery stenosis s/p stent 11/15/2021and carotid artery disease s/p R CEA 2014 transferred from [facility] to [facility] with NSTEMI.  Patient recently underwent cath on 01/10/2021, LIMA graft had 60%, 75% ulcerated lesion in SVG-RCA treated with 2.5x45mm DES, unable engage LIMA-LAD due to stent extending into the aorta, known occluded SVG-OM-LPL, elevated LVEDP 22 mmHg. Recommended myoview to check for anterior ischemia. Since discharge patient continue to notice significant left sided chest pain under the left breast radiating to the left flank. She went to [facility] and was noted to have elevated troponin.  Assessment & Plan    NSTEMI with hx of CABG and multiple intervention  - EKG with more pronounced ST/T wave depression compared to prior. - Hs-troponin peaked at 1277 - Plan for repeat cath via femoral or axillary approach >> see yesterday's note -Continue aspirin and Plavix - Continue statin and Renexa   2. Left subclavian artery stenosis s/p stent 08/02/2020 - likely has recurrent stenosis. Last U/S on 10/20/2020 showed patent stent with increase velocity. Bilateral arm pressure today shows a 33 mmHg difference between the left arm and right arm pressure, L arm pressure noticeably lower. Concerning for worsening left subclavian artery stenosis.   3. ESRD on HD - Underwent dialysis yesterday   4. HLD - 02/23/2021: Cholesterol 168; HDL 41; LDL Cholesterol 76; Triglycerides 257; VLDL 51  - Continue statin   For questions or updates, please contact [place] Please consult [email or web address] for contact  info under        SignedLeanor Kail, PA  02/24/2021, 8:54 AM

## 2021-02-24 NOTE — Plan of Care (Signed)

## 2021-02-24 NOTE — Progress Notes (Signed)
[facility] NEPHROLOG[facility] PROGRESS NOTE  Assessment/ Plan: Pt is a 65 y.o. yo female  with ESRD on HD TTS, CAD s/p CABG 2005 and  PCI 12/2020, HTN, HL, PAD, DM who is currently admitted for NSTEMI after presenting with CP and dyspnea and is seen by nephrology for evaluation and management of ESRD.   Outpt HD orders: [facility] TTS 2nd shift 180 NR, 400/500, EDW 92.5, 4hrs, 2K/2.5Ca Calcitriol 1 TIW 6/2 PTH 129, 01/2021 phos 6.5, Hb 12 Recent post weights 92.3kg - 94.4kg.  #NSTEMI: Presented with chest pain.  Seen by cardiologist and and plan for cardiac catheter today.  # ESRD: Received dialysis yesterday off schedule with 3 L ultrafiltration.  Plan for regular HD today after the cardiac cath.    # Anemia: Hemoglobin at goal on admission.  Not on ESA.  # Secondary hyperparathyroidism: Continue Auryxia, calcitriol.  Monitor calcium phosphorus level.  # HTN/volume: Blood pressure acceptable.  Volume management dialysis.  #Hyperkalemia: Managed with dialysis.    Subjective: Seen and examined.  Tolerated dialysis well yesterday.  Denies nausea vomiting chest pain shortness of breath.  Plan for cardiac cath today. Objective Vital signs in last 24 hours: Vitals:   02/23/21 1322 02/23/21 1745 02/23/21 2013 02/24/21 0525  BP: 136/62 (!) 114/48 (!) 137/54 (!) 120/40  Pulse:  67 72 66  Resp:    17  Temp:   98.7 F (37.1 C) 98.7 F (37.1 C)  TempSrc:   Oral Oral  SpO2:  97% 95% 98%  Weight:      Height:       Weight change: 4.445 kg  Intake/Output Summary (Last 24 hours) at 02/24/2021 0925 Last data filed at 02/24/2021 0248 Gross per 24 hour  Intake 546.13 ml  Output 2942 ml  Net -2395.87 ml        Labs: Basic Metabolic Panel: Recent Labs  Lab 02/23/21 0446 02/23/21 0638 02/24/21 0106  NA 136 134* 132*  K 5.6* 5.4* 4.7  CL 99 98 98  CO2 21* 26 27  GLUCOSE 176* 235* 208*  BUN 69* 69* 29*  CREATININE 7.23* 7.21* 4.13*  CALCIUM 9.3 8.7* 8.8*  PHOS  --  6.5*  --      Liver Function Tests: Recent Labs  Lab 02/23/21 0638  ALBUMIN 2.7*   No results for input(s): LIPASE, AMYLASE in the last 168 hours. No results for input(s): AMMONIA in the last 168 hours. CBC: Recent Labs  Lab 02/22/21 1244 02/23/21 1425 02/24/21 0106  WBC 10.4 9.1 7.8  HGB 10.9* 10.7* 9.4*  HCT 34.3* 33.0* 29.3*  MCV 104.3* 102.2* 102.4*  PLT 194 162 148*    Cardiac Enzymes: No results for input(s): CKTOTAL, CKMB, CKMBINDEX, TROPONINI in the last 168 hours. CBG: Recent Labs  Lab 02/23/21 0757 02/23/21 1311 02/23/21 1748 02/23/21 2122 02/24/21 0751  GLUCAP 209* 152* 272* 105* 185*     Iron Studies: No results for input(s): IRON, TIBC, TRANSFERRIN, FERRITIN in the last 72 hours. Studies/Results: DG Chest Port 1 View  Result Date: 02/22/2021 CLINICAL DATA:  Chest pain EXAM: PORTABLE CHEST 1 VIEW COMPARISON:  01/09/2021 FINDINGS: Right tunneled dialysis catheter is unchanged. Vascular stent is again noted along the aortic arch. Coronary artery stent also present. Postoperative changes of CABG. No new consolidation or edema. No pleural effusion or pneumothorax. Stable cardiomediastinal contours. IMPRESSION: No acute process in the chest. Electronically Signed   By: Macy Mis M.D.   On: 02/22/2021 13:24     Medications:  Infusions:  sodium chloride     sodium chloride 10 mL/hr at 02/24/21 0655   heparin 1,400 Units/hr (02/24/21 0342)    Scheduled Medications:  allopurinol  100 mg Oral Q T,Th,Sa-HD   ALPRAZolam  0.25 mg Oral BID AC   ALPRAZolam  0.5 mg Oral QHS   aspirin EC  81 mg Oral Daily   atorvastatin  80 mg Oral QHS   calcitRIOL  1 mcg Oral QODAY   Chlorhexidine Gluconate Cloth  6 each Topical Q0600   Chlorhexidine Gluconate Cloth  6 each Topical Q0600   Chlorhexidine Gluconate Cloth  6 each Topical Q0600   clopidogrel  75 mg Oral Daily   ferric citrate  420 mg Oral TID WC   gabapentin  400 mg Oral BID   insulin aspart  0-6 Units Subcutaneous  TID WC   insulin aspart protamine- aspart  10 Units Subcutaneous BID WC   ketorolac  1 drop Left Eye QID   levothyroxine  112 mcg Oral QAC breakfast   pantoprazole  40 mg Oral BID   prednisoLONE acetate  1 drop Left Eye TID   Followed by   Derrill Memo ON 03/02/2021] prednisoLONE acetate  1 drop Left Eye BID   Followed by   Derrill Memo ON 03/09/2021] prednisoLONE acetate  1 drop Left Eye Daily   ranolazine  500 mg Oral QHS   sodium chloride flush  3 mL Intravenous Q12H   [START ON 02/27/2021] Vitamin D (Ergocalciferol)  50,000 Units Oral Q Sun    have reviewed scheduled and prn medications.  Physical Exam: General:NAD, comfortable Heart:RRR, s1s2 nl Lungs: Clear bilateral, no wheezing or crackle. Abdomen:soft, Non-tender, non-distended Extremities:No LE edema Dialysis Access: Right IJ TDC site clean.  Maize Brittingham Prasad Jariah Tarkowski 02/24/2021,9:25 AM  LOS: 2 days

## 2021-02-24 NOTE — Progress Notes (Signed)
[place] for Heparin Indication: chest pain/ACS  Allergies  Allergen Reactions   Penicillins Other (See Comments)    REACTION: Unknown, told as a child Has patient had a PCN reaction causing immediate rash, facial/tongue/throat swelling, SOB or lightheadedness with hypotension: Unknown Has patient had a PCN reaction causing severe rash involving mucus membranes or skin necrosis: Unknown Has patient had a PCN reaction that required hospitalization: Unknown Has patient had a PCN reaction occurring within the last 10 years: No If all of the above answers are "NO", then may proceed with Cephalosporin use.    Beta Adrenergic Blockers Other (See Comments)    Hypotension    Patient Measurements: Height: 5\' 5"  (165.1 cm) Weight: 96.8 kg (213 lb 6.5 oz) (stood to scale) IBW/kg (Calculated) : 57 HEPARIN DW (KG): 79.5  Vital Signs: Temp: 98.7 F (37.1 C) (06/08 2013) Temp Source: Oral (06/08 2013) BP: 137/54 (06/08 2013) Pulse Rate: 72 (06/08 2013)  Labs: Recent Labs    02/22/21 1244 02/22/21 1503 02/22/21 1753 02/22/21 1955 02/22/21 2225 02/23/21 0446 02/23/21 0638 02/23/21 1425  HGB 10.9*  --   --   --   --   --   --  10.7*  HCT 34.3*  --   --   --   --   --   --  33.0*  PLT 194  --   --   --   --   --   --  162  HEPARINUNFRC  --   --   --   --  0.10*  --   --  0.16*  CREATININE 7.04*  --   --   --   --  7.23* 7.21*  --   TROPONINIHS 1,038* 1,104* 1,226* 1,277*  --   --   --   --      Estimated Creatinine Clearance: 9.3 mL/min (A) (by C-G formula based on SCr of 7.21 mg/dL (H)).   Assessment: Patient has had Chest pain since yesterday. She has taken ntg x 3 prior to arrival. Also, she was unable to do dialysis treatment today due to pain.Has elevated troponins.  Medications reviewed and she is not on oral anticoagulant therapy. Pharmacy asked to start heparin  Heparin level remains subtherapeutic (0.12) - actually decreased after  rate increased earlier. No issues with line or bleeding reported per RN.  Goal of Therapy:  Heparin level 0.3-0.7 units/ml Monitor platelets by anticoagulation protocol: Yes   Plan:  Rebolus heparin 2000 units IV now Increase IV heparin to 1400 units/hr. Repeat heparin level in 8 hrs.  Sherlon Handing, PharmD, BCPS Please see amion for complete clinical pharmacist phone list  02/24/2021 2:33 AM

## 2021-02-24 NOTE — Progress Notes (Signed)
Left brachial arterial sheath removed and pressure being held by Alison Murray.

## 2021-02-24 NOTE — Interval H&P Note (Signed)
Cath Lab Visit (complete for each Cath Lab visit)  Clinical Evaluation Leading to the Procedure:   ACS: Yes.    Non-ACS:    Anginal Classification: CCS IV  Anti-ischemic medical therapy: Minimal Therapy (1 class of medications)  Non-Invasive Test Results: No non-invasive testing performed  Prior CABG: Previous CABG      History and Physical Interval Note:  02/24/2021 10:37 AM  Donna Howe  has presented today for surgery, with the diagnosis of nstemi.  The various methods of treatment have been discussed with the patient and family. After consideration of risks, benefits and other options for treatment, the patient has consented to  Procedure(s): LEFT HEART CATH AND CORS/GRAFTS ANGIOGRAPHY (N/A) as a surgical intervention.  The patient's history has been reviewed, patient examined, no change in status, stable for surgery.  I have reviewed the patient's chart and labs.  Questions were answered to the patient's satisfaction.     Larae Grooms

## 2021-02-24 NOTE — Progress Notes (Signed)
Nauseated. Medicated

## 2021-02-24 NOTE — Progress Notes (Signed)
Progress Note  Patient Name: Donna Howe Date of Encounter: 02/24/2021  [facility] Cardiologist: Rozann Lesches, MD  Subjective   Had a recurrent chest pain last night which resolved with sublingual nitroglycerin x1.  Currently complaining of mild left shoulder achiness.  Denies breathing issue.  Inpatient Medications    Scheduled Meds:  allopurinol  100 mg Oral Q T,Th,Sa-HD   ALPRAZolam  0.25 mg Oral BID AC   ALPRAZolam  0.5 mg Oral QHS   aspirin EC  81 mg Oral Daily   atorvastatin  80 mg Oral QHS   calcitRIOL  1 mcg Oral QODAY   Chlorhexidine Gluconate Cloth  6 each Topical Q0600   Chlorhexidine Gluconate Cloth  6 each Topical Q0600   Chlorhexidine Gluconate Cloth  6 each Topical Q0600   clopidogrel  75 mg Oral Daily   ferric citrate  420 mg Oral TID WC   gabapentin  400 mg Oral BID   insulin aspart  0-6 Units Subcutaneous TID WC   insulin aspart protamine- aspart  10 Units Subcutaneous BID WC   ketorolac  1 drop Left Eye QID   levothyroxine  112 mcg Oral QAC breakfast   pantoprazole  40 mg Oral BID   prednisoLONE acetate  1 drop Left Eye TID   Followed by   Derrill Memo ON 03/02/2021] prednisoLONE acetate  1 drop Left Eye BID   Followed by   Derrill Memo ON 03/09/2021] prednisoLONE acetate  1 drop Left Eye Daily   ranolazine  500 mg Oral QHS   sodium chloride flush  3 mL Intravenous Q12H   [START ON 02/27/2021] Vitamin D (Ergocalciferol)  50,000 Units Oral Q Sun   Continuous Infusions:  sodium chloride     sodium chloride 10 mL/hr at 02/24/21 0655   heparin 1,400 Units/hr (02/24/21 0342)   PRN Meds: sodium chloride, acetaminophen, ipratropium-albuterol, nitroGLYCERIN, ondansetron (ZOFRAN) IV, sodium chloride flush   Vital Signs    Vitals:   02/23/21 1322 02/23/21 1745 02/23/21 2013 02/24/21 0525  BP: 136/62 (!) 114/48 (!) 137/54 (!) 120/40  Pulse:  67 72 66  Resp:    17  Temp:   98.7 F (37.1 C) 98.7 F (37.1 C)  TempSrc:   Oral Oral  SpO2:  97% 95% 98%   Weight:      Height:        Intake/Output Summary (Last 24 hours) at 02/24/2021 0854 Last data filed at 02/24/2021 0248 Gross per 24 hour  Intake 546.13 ml  Output 2942 ml  Net -2395.87 ml   Last 3 Weights 02/23/2021 02/23/2021 02/23/2021  Weight (lbs) 213 lb 6.5 oz 219 lb 12.8 oz 217 lb 6.4 oz  Weight (kg) 96.8 kg 99.7 kg 98.612 kg      Telemetry    SR with PVCs- Personally Reviewed  ECG    Sinus rhythm with ST abnormality in lateral leads- Personally Reviewed  Physical Exam   GEN: No acute distress.   Neck: No JVD Cardiac: RRR, no murmurs, rubs, or gallops.  Respiratory: Clear to auscultation bilaterally. GI: Soft, nontender, non-distended  MS: No edema; No deformity. Neuro:  Nonfocal  Psych: Normal affect   Labs    High Sensitivity Troponin:   Recent Labs  Lab 02/22/21 1244 02/22/21 1503 02/22/21 1753 02/22/21 1955  TROPONINIHS 1,038* 1,104* 1,226* 1,277*      Chemistry Recent Labs  Lab 02/23/21 0446 02/23/21 0638 02/24/21 0106  NA 136 134* 132*  K 5.6* 5.4* 4.7  CL 99 98  98  CO2 21* 26 27  GLUCOSE 176* 235* 208*  BUN 69* 69* 29*  CREATININE 7.23* 7.21* 4.13*  CALCIUM 9.3 8.7* 8.8*  ALBUMIN  --  2.7*  --   GFRNONAA 6* 6* 12*  ANIONGAP 16* 10 7     Hematology Recent Labs  Lab 02/22/21 1244 02/23/21 1425 02/24/21 0106  WBC 10.4 9.1 7.8  RBC 3.29* 3.23* 2.86*  HGB 10.9* 10.7* 9.4*  HCT 34.3* 33.0* 29.3*  MCV 104.3* 102.2* 102.4*  MCH 33.1 33.1 32.9  MCHC 31.8 32.4 32.1  RDW 14.0 13.7 14.0  PLT 194 162 148*      Radiology    DG Chest Port 1 View  Result Date: 02/22/2021 CLINICAL DATA:  Chest pain EXAM: PORTABLE CHEST 1 VIEW COMPARISON:  01/09/2021 FINDINGS: Right tunneled dialysis catheter is unchanged. Vascular stent is again noted along the aortic arch. Coronary artery stent also present. Postoperative changes of CABG. No new consolidation or edema. No pleural effusion or pneumothorax. Stable cardiomediastinal contours. IMPRESSION: No  acute process in the chest. Electronically Signed   By: Macy Mis M.D.   On: 02/22/2021 13:24    Cardiac Studies   Pending cardiac catheterization  Patient Profile     65 y.o. female with hx of CAD s/p CABG in 2004 and multiple stent procedures since then including stent of ostial LIMA to LAD and more recently SVG to RCA in April  2022 ESRD on HD TThS, HTN, HLD, DM II, Left subclavian artery stenosis s/p stent 11/15/2021and carotid artery disease s/p R CEA 2014 transferred from [facility] to [facility] with NSTEMI.  Patient recently underwent cath on 01/10/2021, LIMA graft had 60%, 75% ulcerated lesion in SVG-RCA treated with 2.5x79mm DES, unable engage LIMA-LAD due to stent extending into the aorta, known occluded SVG-OM-LPL, elevated LVEDP 22 mmHg. Recommended myoview to check for anterior ischemia. Since discharge patient continue to notice significant left sided chest pain under the left breast radiating to the left flank. She went to [facility] and was noted to have elevated troponin.  Assessment & Plan    NSTEMI with hx of CABG and multiple intervention  - EKG with more pronounced ST/T wave depression compared to prior. - Hs-troponin peaked at 1277 - Plan for repeat cath via femoral or axillary approach >> see yesterday's note -Continue aspirin and Plavix - Continue statin and Renexa   2. Left subclavian artery stenosis s/p stent 08/02/2020 - likely has recurrent stenosis. Last U/S on 10/20/2020 showed patent stent with increase velocity. Bilateral arm pressure today shows a 33 mmHg difference between the left arm and right arm pressure, L arm pressure noticeably lower. Concerning for worsening left subclavian artery stenosis.   3. ESRD on HD - Underwent dialysis yesterday   4. HLD - 02/23/2021: Cholesterol 168; HDL 41; LDL Cholesterol 76; Triglycerides 257; VLDL 51  - Continue statin   For questions or updates, please contact [place] Please consult [email or web address] for contact  info under        SignedLeanor Kail, PA  02/24/2021, 8:54 AM

## 2021-02-24 NOTE — Progress Notes (Signed)
I was called to evaluate Donna Howe's left subclavian stent.  She was admitted with acute chest pain in the setting of known coronary artery disease and previous CABG.  Ultimately found to have NSTEMI.  Her left subclavian stent which is the inflow to one of her bypass grafts has an in-stent restenosis that appears significant with a 30 point gradient across the stent.  Previously placed by Dr. Donzetta Matters.  I think it would be reasonable to treat this in-stent restenosis to try and improve flow to her LIMA graft.  I have discussed with her son on the telephone who has given permission to proceed given that [name] is currently under sedation.  Marty Heck, MD [facility] Office: [place]

## 2021-02-24 NOTE — Progress Notes (Signed)
PROGRESS NOTE    Donna Howe  TDV:[id number] DOB: [birth year] DOA: 02/22/2021 PCP: Curlene Labrum, MD    Brief Narrative:  Donna Howe is a 65 year old female with past medical history significant for CAD s/p PCI with DES x2 to RCA, ESRD on HD TTS, type 2 diabetes mellitus, essential hypertension, morbid obesity and recent cataract surgery who presented to the ED for evaluation of chest pain.  Onset for 24 hours, localized to her left upper breast region with radiation to her left arm.  Occurred at rest.  Chest pain improved with some lingual nitroglycerin tablets, otherwise no appreciable exacerbating or alleviating factors.  Also associated with shortness of breath, diaphoresis.  Denies nausea/vomiting, no dizziness or lightheadedness.  In the ED, temperature 98.5 F, HR 71, RR 16, BP 133/54, SPO2 97% on room air. Sodium 136, potassium 5.3, chloride 99, CO2 27, BUN 66, creatinine 7.04, glucose 189.  WBC 10.4, hemoglobin 10.9, platelets 194.  High sensitive troponin 1038> 1104.  Cova-19 PCR negative.  Influenza A/B PCR negative.Chest x-ray with no acute cardiopulmonary disease process.  EKG with normal sinus rhythm, rate 76, QTc 468, diffuse ST-T changes which are not certainly changed from previous EKG.  Cardiology was consulted.  Patient was transferred to [facility] for further evaluation.  TRH consulted for admission.   Assessment & Plan:   Principal Problem:   NSTEMI (non-ST elevated myocardial infarction) ([place]) Active Problems:   Obesity (BMI 30-39.9)   CAD (coronary artery disease)   Diabetes mellitus with ESRD (end-stage renal disease) (HCC)   Hypothyroidism   Hyperkalemia   ESRD on dialysis ([place])   PAD (peripheral artery disease) (HCC)   Stenosis of left subclavian artery (HCC)   NSTEMI Hx CAD s/p CABG and PCI Patient presenting to the ED after acute onset chest pain with radiation to her left arm with associated diaphoresis, shortness of breath and during  hemodialysis, with improvement of sublingual nitroglycerin.  Patient was noted to have elevated troponin with EKG changes that are similar to previous in appearance.  Patient with history of CABG and recent angioplasty with stent placement April 2022 to RCA. --Cardiology consulted --Troponin 1038>1104>1226>1277 --TTE: Pending --Heparin drip --Aspirin 81 mg p.o. daily --Plavix 75 mg p.o. daily --Atorvastatin 80 mg p.o. daily --Ranolazine 500 mg p.o. nightly (states intolerant to BID dosing causing dizziness) --Pending left heart catheterization today --Continue monitor on telemetry  Hyperkalemia: Resolved Potassium 5.3 on admission.  Received Lokelma 10 g p.o. on 6/7.  M4.7 this morning. --HD today after heart catheterization --Follow BMP daily --monitor on telemetry  Left subclavian artery stenosis s/p stent 2021 --Cardiology concern for recurrent stenosis, this was discussed with vascular surgery on 6/8  Type 2 diabetes mellitus: Hemoglobin A1c 7.0.  Home regimen includes Novolin 70/30 24u [place] BID.  --Continue Novolin 70/30 at reduced dose 10u [place] BID --SSI for further coverage --CBG qAC/HS  ESRD on HD: --Nephrology following --Continue HD, plan for today after catheterization  Anxiety: --Continue home Alprazolam 0.25 mg p.o. qAM,qPM and 0.5 mg nightly  Hypothyroidism: TSH 1.057 on 06/18/2020. --Continue levothyroxine 112 mcg p.o. daily  GERD: Continue PPI  COPD: Without acute exacerbation.  Not oxygen dependent at baseline. --Continue nebs as needed  Recent cataract surgery: Reports recent cataract surgery to left thigh by Dr.Hecker on 02/15/21.  --Completed permitted on eyedrops --Continue ketorolac 1 drop left eye 4 times daily x 21 days --Continue prednisolone eyedrops --Outpatient follow-up with ophthalmology as scheduled  Morbid obesity: Body mass index  is 35.51 kg/m.  Discussed with patient needs for aggressive lifestyle changes/weight loss as this complicates  all facets of care.  Outpatient follow-up with PCP.    DVT prophylaxis: Heparin drip    Code Status: Full Code Family Communication: no family present at bedside this morning  Disposition Plan:  Level of care: Telemetry Cardiac Status is: Inpatient  Remains inpatient appropriate because:Ongoing diagnostic testing needed not appropriate for outpatient work up, Unsafe d/c plan, IV treatments appropriate due to intensity of illness or inability to take PO and Inpatient level of care appropriate due to severity of illness   Dispo: The patient is from: Home              Anticipated d/c is to: Home              Patient currently is not medically stable to d/c.   Difficult to place patient No     Consultants:  Cardiology Nephrology  Procedures:  TTE: Pending Left heart catheterization: Pending for today  Antimicrobials:  None   Subjective: Patient seen and examined, sitting at edge of bed.  Awaiting heart catheterization later this afternoon.  Asking about whether she will receive dialysis today.  Reports episode of chest discomfort similar to episodes in the past relieved with nitroglycerin overnight; otherwise no other specific questions or concerns at this time.  Denies headache, no dizziness, no palpitations, no fever/chills/night sweats, no nausea/vomiting/diarrhea, no abdominal pain, no weakness, no fatigue.  No other acute events overnight per nursing staff.  Objective: Vitals:   02/23/21 1322 02/23/21 1745 02/23/21 2013 02/24/21 0525  BP: 136/62 (!) 114/48 (!) 137/54 (!) 120/40  Pulse:  67 72 66  Resp:    17  Temp:   98.7 F (37.1 C) 98.7 F (37.1 C)  TempSrc:   Oral Oral  SpO2:  97% 95% 98%  Weight:      Height:        Intake/Output Summary (Last 24 hours) at 02/24/2021 1040 Last data filed at 02/24/2021 0248 Gross per 24 hour  Intake 546.13 ml  Output 2942 ml  Net -2395.87 ml   Filed Weights   02/23/21 0433 02/23/21 0825 02/23/21 1248  Weight: 98.6 kg 99.7  kg 96.8 kg    Examination:  General exam: Appears calm and comfortable  Respiratory system: Clear to auscultation. Respiratory effort normal.  On room air. Cardiovascular system: S1 & S2 heard, RRR. No JVD, murmurs, rubs, gallops or clicks. No pedal edema.  HD catheter noted right chest. Gastrointestinal system: Abdomen is nondistended, soft and nontender. No organomegaly or masses felt. Normal bowel sounds heard. Central nervous system: Alert and oriented. No focal neurological deficits. Extremities: Symmetric 5 x 5 power. Skin: No rashes, lesions or ulcers Psychiatry: Judgement and insight appear normal. Mood & affect appropriate.     Data Reviewed: I have personally reviewed following labs and imaging studies  CBC: Recent Labs  Lab 02/22/21 1244 02/23/21 1425 02/24/21 0106  WBC 10.4 9.1 7.8  HGB 10.9* 10.7* 9.4*  HCT 34.3* 33.0* 29.3*  MCV 104.3* 102.2* 102.4*  PLT 194 162 557*   Basic Metabolic Panel: Recent Labs  Lab 02/22/21 1244 02/23/21 0446 02/23/21 0638 02/24/21 0106  NA 136 136 134* 132*  K 5.3* 5.6* 5.4* 4.7  CL 99 99 98 98  CO2 27 21* 26 27  GLUCOSE 189* 176* 235* 208*  BUN 66* 69* 69* 29*  CREATININE 7.04* 7.23* 7.21* 4.13*  CALCIUM 9.2 9.3 8.7* 8.8*  PHOS  --   --  6.5*  --    GFR: Estimated Creatinine Clearance: 16.3 mL/min (A) (by C-G formula based on SCr of 4.13 mg/dL (H)). Liver Function Tests: Recent Labs  Lab 02/23/21 3295  ALBUMIN 2.7*   No results for input(s): LIPASE, AMYLASE in the last 168 hours. No results for input(s): AMMONIA in the last 168 hours. Coagulation Profile: No results for input(s): INR, PROTIME in the last 168 hours. Cardiac Enzymes: No results for input(s): CKTOTAL, CKMB, CKMBINDEX, TROPONINI in the last 168 hours. BNP (last 3 results) No results for input(s): PROBNP in the last 8760 hours. HbA1C: Recent Labs    02/22/21 1647  HGBA1C 6.9*   CBG: Recent Labs  Lab 02/23/21 0757 02/23/21 1311  02/23/21 1748 02/23/21 2122 02/24/21 0751  GLUCAP 209* 152* 272* 105* 185*   Lipid Profile: Recent Labs    02/23/21 0446  CHOL 168  HDL 41  LDLCALC 76  TRIG 257*  CHOLHDL 4.1   Thyroid Function Tests: No results for input(s): TSH, T4TOTAL, FREET4, T3FREE, THYROIDAB in the last 72 hours. Anemia Panel: No results for input(s): VITAMINB12, FOLATE, FERRITIN, TIBC, IRON, RETICCTPCT in the last 72 hours. Sepsis Labs: No results for input(s): PROCALCITON, LATICACIDVEN in the last 168 hours.  Recent Results (from the past 240 hour(s))  Resp Panel by RT-PCR (Flu A&B, Covid) Nasopharyngeal Swab     Status: None   Collection Time: 02/22/21 12:04 PM   Specimen: Nasopharyngeal Swab; Nasopharyngeal(NP) swabs in vial transport medium  Result Value Ref Range Status   SARS Coronavirus 2 by RT PCR NEGATIVE NEGATIVE Final    Comment: (NOTE) SARS-CoV-2 target nucleic acids are NOT DETECTED.  The SARS-CoV-2 RNA is generally detectable in upper respiratory specimens during the acute phase of infection. The lowest concentration of SARS-CoV-2 viral copies this assay can detect is 138 copies/mL. A negative result does not preclude SARS-Cov-2 infection and should not be used as the sole basis for treatment or other patient management decisions. A negative result may occur with  improper specimen collection/handling, submission of specimen other than nasopharyngeal swab, presence of viral mutation(s) within the areas targeted by this assay, and inadequate number of viral copies(<138 copies/mL). A negative result must be combined with clinical observations, patient history, and epidemiological information. The expected result is Negative.  Fact Sheet for Patients:  EntrepreneurPulse.com.au  Fact Sheet for Healthcare Providers:  IncredibleEmployment.be  This test is no t yet approved or cleared by the [place] FDA and  has been authorized for detection  and/or diagnosis of SARS-CoV-2 by FDA under an Emergency Use Authorization (EUA). This EUA will remain  in effect (meaning this test can be used) for the duration of the COVID-19 declaration under Section 564(b)(1) of the Act, 21 U.S.C.section 360bbb-3(b)(1), unless the authorization is terminated  or revoked sooner.       Influenza A by PCR NEGATIVE NEGATIVE Final   Influenza B by PCR NEGATIVE NEGATIVE Final    Comment: (NOTE) The Xpert Xpress SARS-CoV-2/FLU/RSV plus assay is intended as an aid in the diagnosis of influenza from Nasopharyngeal swab specimens and should not be used as a sole basis for treatment. Nasal washings and aspirates are unacceptable for Xpert Xpress SARS-CoV-2/FLU/RSV testing.  Fact Sheet for Patients: EntrepreneurPulse.com.au  Fact Sheet for Healthcare Providers: IncredibleEmployment.be  This test is not yet approved or cleared by the [place] FDA and has been authorized for detection and/or diagnosis of SARS-CoV-2 by FDA under an Emergency Use Authorization (EUA). This EUA will remain in effect (meaning  this test can be used) for the duration of the COVID-19 declaration under Section 564(b)(1) of the Act, 21 U.S.C. section 360bbb-3(b)(1), unless the authorization is terminated or revoked.  Performed at [facility], [address] [place], [place] [address]          Radiology Studies: [facility] Chest [facility] 1 View  Result Date: 02/22/2021 CLINICAL DATA:  Chest pain EXAM: PORTABLE CHEST 1 VIEW COMPARISON:  01/09/2021 FINDINGS: Right tunneled dialysis catheter is unchanged. Vascular stent is again noted along the aortic arch. Coronary artery stent also present. Postoperative changes of CABG. No new consolidation or edema. No pleural effusion or pneumothorax. Stable cardiomediastinal contours. IMPRESSION: No acute process in the chest. Electronically Signed   By: Macy Mis M.D.   On: 02/22/2021 13:24          Scheduled Meds:  allopurinol  100 mg Oral Q T,Th,Sa-HD   ALPRAZolam  0.25 mg Oral BID AC   ALPRAZolam  0.5 mg Oral QHS   aspirin EC  81 mg Oral Daily   atorvastatin  80 mg Oral QHS   calcitRIOL  1 mcg Oral QODAY   Chlorhexidine Gluconate Cloth  6 each Topical Q0600   Chlorhexidine Gluconate Cloth  6 each Topical Q0600   Chlorhexidine Gluconate Cloth  6 each Topical Q0600   clopidogrel  75 mg Oral Daily   ferric citrate  420 mg Oral TID WC   gabapentin  400 mg Oral BID   insulin aspart  0-6 Units Subcutaneous TID WC   insulin aspart protamine- aspart  10 Units Subcutaneous BID WC   ketorolac  1 drop Left Eye QID   levothyroxine  112 mcg Oral QAC breakfast   pantoprazole  40 mg Oral BID   prednisoLONE acetate  1 drop Left Eye TID   Followed by   Derrill Memo ON 03/02/2021] prednisoLONE acetate  1 drop Left Eye BID   Followed by   Derrill Memo ON 03/09/2021] prednisoLONE acetate  1 drop Left Eye Daily   ranolazine  500 mg Oral QHS   sodium chloride flush  3 mL Intravenous Q12H   [START ON 02/27/2021] Vitamin D (Ergocalciferol)  50,000 Units Oral Q Sun   Continuous Infusions:  sodium chloride     sodium chloride 10 mL/hr at 02/24/21 0655   heparin Stopped (02/24/21 1032)     LOS: 2 days    Time spent: 39 minutes spent on chart review, discussion with nursing staff, consultants, updating family and interview/physical exam; more than 50% of that time was spent in counseling and/or coordination of care.    Tahji Paddock [place] DO [facility] Available via Epic secure chat 7am-7pm After these hours, please refer to coverage provider listed on [email or web address] 02/24/2021, 10:40 AM

## 2021-02-24 NOTE — Op Note (Signed)
Patient name: Donna Howe MRN: [id number] DOB: 21-Dec-1955 Sex: female  02/24/2021 Pre-operative Diagnosis: In-stent stenosis in left subclavian stent in the setting of NSTEMI and limited flow to LIMA graft Post-operative diagnosis:  Same Surgeon:  Marty Heck, MD Procedure Performed: 1.  Left subclavian arteriogram  2.  Angioplasty of left subclavian stent (4 mm and 5 mm Sterling and 6 mm Mustang) 3.  Left subclavian stent using a covered stent (7 mm x 19 mm VBX)  Indications: 65 year old female that was admitted with chest pain and found to have an NSTEMI.  Further work-up was concerning for in-stent stenosis of her left subclavian stent in the setting of LIMA graft after previous CABG.  She had a left subclavian stent placed by Dr. Donzetta Matters last year.  I was called during the procedure to evaluate her left subclavian stent after it was noted there was a 30 mmHg pressure gradient across the stent during cardiac intervention by interventional cardiology.  I did get consent from her son over the phone given intraprocedure consult.  Findings: My initial plan was to realign her left subclavian stent with a covered stent.  Unfortunately I could not get an 035 balloon to cross the in-stent stenosis given it was high-grade and ultimately had to cross the left subclavian stent with an 018 system and predilated this in-stent stenosis with a 4 mm and 5 mm Sterling.  I still could not get the VBX stent to cross so I then upsized to an 035 system using a Rosen wire in the descending thoracic aorta and finally predilated with a 6 mm Mustang.  Finally I was able to get the stent to track across the left subclavian in-stent stenosis and I realigned the previous stent with a 7 mm x 19 mm VBX.  I did make this stent a little more distal in the subclavian artery given it appeared she had some disease in the subclavian distal to the previous stent but below the LIMA graft.  Final pullback pressure showed no  significant gradient across the subclavian stent after restenting and there was a 30 mm Hg point gradient at the start of the case.   Procedure: I was called to lab 9 to evaluate her left subclavian stent and brachial access had been previously obtained by Dr. Irish Lack.  In evaluating the images, it did appear that there was a high-grade greater than 70 to 80% in-stent stenosis in the left subclavian stent and this had a pressure gradient of greater than 30 mmHg as measured by cardiology.  Once Dr. Irish Lack was finished with his portion of the procedure, I then used a versa core wire to exchange for a long 7 [place] destination sheath in the left brachial artery.  I advanced this all the way into the axillary artery.  Initially I planned to realign her existing subclavian stent which was bare-metal with a covered stent.  It became apparent that this was a very tight lesion and I could not even get an 035 catheter to track across the wire in the left subclavian stent and met resistance.  Ultimately initially tried to also cross a 4 mm Mustang balloon unsuccessful.  I then downsized to a V 18 wire and a smaller system and finally was able to get a 4 mm Sterling to cross and predilated the left subclavian in-stent stenosis with a 4 mm Sterling to nominal pressure for 2 minutes.  I then used a 5 mm Sterling to nominal pressure for  2 minutes.  That point time I tried to place a 7 mm VBX inside the existing stent but this would not track even after pre-dilation.  I then used a KMP catheter to cross the stent and exchanged for a stiffer Rosen wire down in the descending thoracic aorta.  Additional 3000 units of IV heparin was given we did check an ACT to maintain greater than 250.  At this point in time I advanced the sheath as far as I could into the left subclavian artery but it would not track across the left subclavian stent.  I then elected to finally predilate th left subclavian stent with a 6 mm x 20 mm Mustang  over the [place] system.  Finally I was able to get the VBX to track after dilation with a 6 mm baloon and I stented her proximal left subclavian making sure the stent was a little more distal given there appeared to be some calcified disease just distal to her previous stent.  This was deployed to nominal pressure inflating it to about 6.5 mm given her previous stent was 6 mm.  Final injection showed filling of the LIMA graft with no evidence of residual stenosis.  I did put a KMP catheter across the stent in her aortic arch and did a pullback pressure and there was no residual gradient at completion in the left subclavian stent.  Wires and catheters were removed.  She was taken to holding to have the sheath removed.     Marty Heck, MD [facility] Office: [phone]

## 2021-02-25 ENCOUNTER — Other Ambulatory Visit: Payer: Self-pay

## 2021-02-25 ENCOUNTER — Other Ambulatory Visit: Payer: Self-pay | Admitting: *Deleted

## 2021-02-25 ENCOUNTER — Other Ambulatory Visit (HOSPITAL_COMMUNITY): Payer: Medicare HMO

## 2021-02-25 ENCOUNTER — Encounter (HOSPITAL_COMMUNITY): Payer: Self-pay | Admitting: Interventional Cardiology

## 2021-02-25 ENCOUNTER — Other Ambulatory Visit (HOSPITAL_COMMUNITY): Payer: Self-pay

## 2021-02-25 DIAGNOSIS — I214 Non-ST elevation (NSTEMI) myocardial infarction: Secondary | ICD-10-CM | POA: Diagnosis not present

## 2021-02-25 DIAGNOSIS — Z09 Encounter for follow-up examination after completed treatment for conditions other than malignant neoplasm: Secondary | ICD-10-CM

## 2021-02-25 DIAGNOSIS — I771 Stricture of artery: Secondary | ICD-10-CM

## 2021-02-25 DIAGNOSIS — N186 End stage renal disease: Secondary | ICD-10-CM

## 2021-02-25 DIAGNOSIS — Z992 Dependence on renal dialysis: Secondary | ICD-10-CM | POA: Diagnosis not present

## 2021-02-25 DIAGNOSIS — N2581 Secondary hyperparathyroidism of renal origin: Secondary | ICD-10-CM | POA: Diagnosis not present

## 2021-02-25 DIAGNOSIS — R0600 Dyspnea, unspecified: Secondary | ICD-10-CM | POA: Diagnosis not present

## 2021-02-25 DIAGNOSIS — I1311 Hypertensive heart and chronic kidney disease without heart failure, with stage 5 chronic kidney disease, or end stage renal disease: Secondary | ICD-10-CM | POA: Diagnosis not present

## 2021-02-25 DIAGNOSIS — E875 Hyperkalemia: Secondary | ICD-10-CM | POA: Diagnosis not present

## 2021-02-25 DIAGNOSIS — Z794 Long term (current) use of insulin: Secondary | ICD-10-CM | POA: Diagnosis not present

## 2021-02-25 DIAGNOSIS — E119 Type 2 diabetes mellitus without complications: Secondary | ICD-10-CM | POA: Diagnosis not present

## 2021-02-25 DIAGNOSIS — Z95820 Peripheral vascular angioplasty status with implants and grafts: Secondary | ICD-10-CM

## 2021-02-25 DIAGNOSIS — I2510 Atherosclerotic heart disease of native coronary artery without angina pectoris: Secondary | ICD-10-CM | POA: Diagnosis not present

## 2021-02-25 LAB — BASIC METABOLIC PANEL
Anion gap: 8 (ref 5–15)
BUN: 14 mg/dL (ref 8–23)
CO2: 27 mmol/L (ref 22–32)
Calcium: 8.6 mg/dL — ABNORMAL LOW (ref 8.9–10.3)
Chloride: 97 mmol/L — ABNORMAL LOW (ref 98–111)
Creatinine, Ser: 2.68 mg/dL — ABNORMAL HIGH (ref 0.44–1.00)
GFR, Estimated: 20 mL/min — ABNORMAL LOW (ref >60)
Glucose, Bld: 257 mg/dL — ABNORMAL HIGH (ref 70–99)
Potassium: 3.7 mmol/L (ref 3.5–5.1)
Sodium: 132 mmol/L — ABNORMAL LOW (ref 135–145)

## 2021-02-25 LAB — CBC
HCT: 29.6 % — ABNORMAL LOW (ref 36.0–46.0)
Hemoglobin: 9.7 g/dL — ABNORMAL LOW (ref 12.0–15.0)
MCH: 33.1 pg (ref 26.0–34.0)
MCHC: 32.8 g/dL (ref 30.0–36.0)
MCV: 101 fL — ABNORMAL HIGH (ref 80.0–100.0)
Platelets: 161 10*3/uL (ref 150–400)
RBC: 2.93 MIL/uL — ABNORMAL LOW (ref 3.87–5.11)
RDW: 14.1 % (ref 11.5–15.5)
WBC: 8.4 10*3/uL (ref 4.0–10.5)
nRBC: 0 % (ref 0.0–0.2)

## 2021-02-25 LAB — GLUCOSE, CAPILLARY: Glucose-Capillary: 243 mg/dL — ABNORMAL HIGH (ref 70–99)

## 2021-02-25 MED ORDER — EZETIMIBE 10 MG PO TABS
10.0000 mg | ORAL_TABLET | Freq: Every day | ORAL | Status: DC
  Administered 2021-02-25: 10 mg via ORAL
  Filled 2021-02-25: qty 1

## 2021-02-25 MED ORDER — NITROGLYCERIN 0.4 MG SL SUBL
0.4000 mg | SUBLINGUAL_TABLET | SUBLINGUAL | 3 refills | Status: DC | PRN
  Filled 2021-02-25: qty 100, 28d supply, fill #0

## 2021-02-25 MED ORDER — EZETIMIBE 10 MG PO TABS
10.0000 mg | ORAL_TABLET | Freq: Every day | ORAL | 11 refills | Status: DC
  Filled 2021-02-25: qty 30, 30d supply, fill #0

## 2021-02-25 MED ORDER — HEPARIN SODIUM (PORCINE) 1000 UNIT/ML IJ SOLN
INTRAMUSCULAR | Status: AC
  Filled 2021-02-25: qty 4

## 2021-02-25 NOTE — TOC Initial Note (Signed)
Transition of Care [facility]) - Initial/Assessment Note    Patient Details  Name: Donna Howe MRN: [id number] Date of Birth: 1956-04-03  [facility]) CM/SW Contact:    Bethena Roys, RN Phone Number: 02/25/2021, 10:05 AM  Clinical Narrative:  Risk for readmission assessment completed. Prior to arrival patient was from home alone. Per patient, she still drives to appointments and gets medications without any issues. Patient states she moves around without any issues. No home needs identified at this time. Patient is aware to contact PCP if any needs occur after transition home.                  Expected Discharge Plan: Home/Self Care Barriers to Discharge: No Barriers Identified   Patient Goals and CMS Choice Patient states their goals for this hospitalization and ongoing recovery are:: to return home.   Choice offered to / list presented to : NA  Expected Discharge Plan and Services Expected Discharge Plan: Home/Self Care In-house Referral: NA Discharge Planning Services: NA Post Acute Care Choice: NA Living arrangements for the past 2 months: Mobile Home Expected Discharge Date: 02/25/21               DME Arranged: N/A DME Agency: NA       HH Arranged: NA    Prior Living Arrangements/Services Living arrangements for the past 2 months: Mobile Home Lives with:: Self Patient language and need for interpreter reviewed:: Yes Do you feel safe going back to the place where you live?: Yes      Need for Family Participation in Patient Care: No (Comment) Care giver support system in place?: No (comment)   Criminal Activity/Legal Involvement Pertinent to Current Situation/Hospitalization: No - Comment as needed  Activities of Daily Living Home Assistive Devices/Equipment: CBG Meter ADL Screening (condition at time of admission) Patient's cognitive ability adequate to safely complete daily activities?: Yes Is the patient deaf or have difficulty  hearing?: No Does the patient have difficulty seeing, even when wearing glasses/contacts?: Yes Does the patient have difficulty concentrating, remembering, or making decisions?: No Patient able to express need for assistance with ADLs?: Yes Does the patient have difficulty dressing or bathing?: No Independently performs ADLs?: Yes (appropriate for developmental age) Does the patient have difficulty walking or climbing stairs?: No Weakness of Legs: None Weakness of Arms/Hands: None  Permission Sought/Granted Permission sought to share information with : Family Supports, Case Manager    Emotional Assessment Appearance:: Appears stated age Attitude/Demeanor/Rapport: Engaged Affect (typically observed): Appropriate Orientation: : Oriented to Situation, Oriented to  Time, Oriented to Place, Oriented to Self Alcohol / Substance Use: Not Applicable Psych Involvement: No (comment)  Admission diagnosis:  Precordial chest pain [R07.2] ACS (acute coronary syndrome) ([place]) [I24.9] Elevated troponin [R77.8] ESRD on dialysis ([place]) [N18.6, Z99.2] NSTEMI (non-ST elevated myocardial infarction) ([place]) [I21.4] Patient Active Problem List   Diagnosis Date Noted   Stenosis of left subclavian artery ([place])    Leukocytosis 01/09/2021   Elevated MCV 01/09/2021   Diabetic neuropathy ([place]) 01/09/2021   Unspecified protein-calorie malnutrition ([place]) 07/05/2020   Allergy, unspecified, initial encounter 06/30/2020   Anaphylactic shock, unspecified, initial encounter 06/30/2020   Anemia in chronic kidney disease 06/30/2020   Coagulation defect, unspecified ([place]) 06/30/2020   Diarrhea, unspecified 06/30/2020   Encounter for immunization 06/30/2020   Iron deficiency anemia, unspecified 06/30/2020   Pain, unspecified 06/30/2020   PAD (peripheral artery disease) ([place]) 06/30/2020   Pruritus, unspecified 06/30/2020   Secondary hyperparathyroidism of renal  origin ([place]) 06/30/2020   ESRD on dialysis ([place])     SOB (shortness of breath)    Chronic diastolic HF (heart failure) (H[facility])    GERD (gastroesophageal reflux disease)    Acute renal failure superimposed on stage 5 chronic kidney disease, not on chronic dialysis ([place]) 06/13/2020   Uncontrolled type 2 diabetes mellitus with hyperglycemia, with long-term current use of insulin ([place]) 06/13/2020   Pneumonia 12/17/2019   Cough variant asthma with component of UACS 11/20/2019   Elevated troponin I level 10/02/2019   Chest pain 10/01/2019   Unstable angina ([place]) 04/18/2018   Hypothyroidism 04/18/2018   S/P angioplasty with stent- DES to [facility] and to LIMA to LAD with DES 04/09/18.   04/10/2018   Angina pectoris ([place]) 04/05/2018   Status post coronary artery stent placement    Acute coronary syndrome ([place]) 05/31/2017   Acute chest pain    NSTEMI (non-ST elevated myocardial infarction) ([place])    History of coronary artery disease    Diabetes mellitus with ESRD (end-stage renal disease) ([place]) 10/28/2013   Hypoglycemia associated with diabetes ([place]) 10/28/2013   Thrombocytopenia, unspecified ([place]) 10/28/2013   Hypokalemia 10/27/2013   Syncope 10/26/2013   Anemia 10/26/2013   AKI (acute kidney injury) ([place]) 10/26/2013   Fracture of toe of right foot 62/95/2841   Umbilical hernia    Carotid artery disease ([place])    Occlusion and stenosis of carotid artery without mention of cerebral infarction 04/15/2013   Hx of CABG    Ejection fraction    Atherosclerosis of native artery of extremity with intermittent claudication ([place]) 02/11/2013   Diabetes mellitus with renal manifestation ([place]) 01/21/2013   Bradycardia 01/03/2013   Chronic kidney disease (CKD), stage III (moderate) ([place])    Gout    PROTEINURIA, MILD 01/18/2010   Obesity (BMI 30-39.9) 08/26/2009   Asthma 08/26/2009   Hyperlipidemia 03/31/2007   Essential hypertension 03/31/2007   CAD (coronary artery disease) 03/31/2007   PCP:  Curlene Labrum, MD Pharmacy:   [facility] [address] [place] - [place] [address] [place] [address] Phone: [phone] Fax: [phone]  [facility] - [place], [place] - [address] [place] [address] Phone: [phone] Fax: [phone]  [place], [place] - [place] [address] Phone: [phone] Fax: [phone]  [facility] [address] [place] [address] Phone: [phone] Fax: [phone]   Readmission Risk Interventions Readmission Risk Prevention Plan 02/25/2021 06/25/2020  Transportation Screening Complete Complete  Medication Review Press photographer) Complete Complete  PCP or Specialist appointment within 3-5 days of discharge - Complete  HRI or [place] Complete Complete  SW Recovery Care/Counseling Consult Complete Complete  Palliative Care Screening Not Applicable Not [place] Not Applicable Not Applicable  Some recent data might be hidden

## 2021-02-25 NOTE — Progress Notes (Signed)
Progress Note  Patient Name: Donna Howe Date of Encounter: 02/25/2021  [facility] Cardiologist: Rozann Lesches, MD   Subjective   Feeling well. No chest pain, sob or palpitations.  Had dialysis last night.   Inpatient Medications    Scheduled Meds:  allopurinol  100 mg Oral Q T,Th,Sa-HD   ALPRAZolam  0.25 mg Oral BID AC   ALPRAZolam  0.5 mg Oral QHS   aspirin EC  81 mg Oral Daily   atorvastatin  80 mg Oral QHS   calcitRIOL  1 mcg Oral QODAY   Chlorhexidine Gluconate Cloth  6 each Topical Q0600   Chlorhexidine Gluconate Cloth  6 each Topical Q0600   Chlorhexidine Gluconate Cloth  6 each Topical Q0600   clopidogrel  75 mg Oral Q breakfast   ezetimibe  10 mg Oral Daily   ferric citrate  420 mg Oral TID WC   gabapentin  400 mg Oral BID   heparin sodium (porcine)       insulin aspart  0-6 Units Subcutaneous TID WC   insulin aspart protamine- aspart  10 Units Subcutaneous BID WC   ketorolac  1 drop Left Eye QID   levothyroxine  112 mcg Oral QAC breakfast   pantoprazole  40 mg Oral BID   prednisoLONE acetate  1 drop Left Eye TID   Followed by   Derrill Memo ON 03/02/2021] prednisoLONE acetate  1 drop Left Eye BID   Followed by   Derrill Memo ON 03/09/2021] prednisoLONE acetate  1 drop Left Eye Daily   ranolazine  500 mg Oral QHS   sodium chloride flush  3 mL Intravenous Q12H   [START ON 02/27/2021] Vitamin D (Ergocalciferol)  50,000 Units Oral Q Sun   Continuous Infusions:  sodium chloride     PRN Meds: sodium chloride, acetaminophen, ipratropium-albuterol, nitroGLYCERIN, ondansetron (ZOFRAN) IV, ondansetron (ZOFRAN) IV, sodium chloride flush   Vital Signs    Vitals:   02/25/21 0121 02/25/21 0132 02/25/21 0300 02/25/21 0430  BP: (!) 113/50 (!) 129/51  119/60  Pulse: 82 81  73  Resp: 17 16  16   Temp: 97.7 F (36.5 C) 97.9 F (36.6 C)  98.8 F (37.1 C)  TempSrc: Oral Oral Oral Oral  SpO2: 97% 95%  91%  Weight: (P) 100 kg     Height:        Intake/Output Summary  (Last 24 hours) at 02/25/2021 0800 Last data filed at 02/25/2021 0121 Gross per 24 hour  Intake 240 ml  Output 2375 ml  Net -2135 ml   Last 3 Weights 02/25/2021 02/24/2021 02/23/2021  Weight (lbs) 220 lb 7.4 oz 225 lb 12 oz 213 lb 6.5 oz  Weight (kg) 100 kg 102.4 kg 96.8 kg      Telemetry    NSR - Personally Reviewed  ECG    SR with anterior lateral TWI/St changes - Personally Reviewed  Physical Exam   GEN: No acute distress.   Neck: No JVD Cardiac: RRR, no murmurs, rubs, or gallops. L brachial cath site with mild hematoma  Respiratory: Clear to auscultation bilaterally. GI: Soft, nontender, non-distended  MS: No edema; No deformity. Neuro:  Nonfocal  Psych: Normal affect   Labs    High Sensitivity Troponin:   Recent Labs  Lab 02/22/21 1244 02/22/21 1503 02/22/21 1753 02/22/21 1955  TROPONINIHS 1,038* 1,104* 1,226* 1,277*      Chemistry Recent Labs  Lab 02/23/21 0638 02/24/21 0106 02/25/21 0418  NA 134* 132* 132*  K 5.4* 4.7 3.7  CL 98 98 97*  CO2 26 27 27   GLUCOSE 235* 208* 257*  BUN 69* 29* 14  CREATININE 7.21* 4.13* 2.68*  CALCIUM 8.7* 8.8* 8.6*  ALBUMIN 2.7*  --   --   GFRNONAA 6* 12* 20*  ANIONGAP 10 7 8      Hematology Recent Labs  Lab 02/23/21 1425 02/24/21 0106 02/25/21 0418  WBC 9.1 7.8 8.4  RBC 3.23* 2.86* 2.93*  HGB 10.7* 9.4* 9.7*  HCT 33.0* 29.3* 29.6*  MCV 102.2* 102.4* 101.0*  MCH 33.1 32.9 33.1  MCHC 32.4 32.1 32.8  RDW 13.7 14.0 14.1  PLT 162 148* 161    BNPNo results for input(s): BNP, PROBNP in the last 168 hours.   DDimer No results for input(s): DDIMER in the last 168 hours.   Radiology    CARDIAC CATHETERIZATION  Result Date: 02/24/2021  Ost LM to Dist LM lesion is 95% stenosed. LIMA to LAD is patent.  Prox LAD to Mid LAD lesion is 100% stenosed. Known from prior catheterization. Unable to advance left catheter through the severely diseased subclavian stent.  Ost LAD to Prox LAD lesion is 100% stenosed.  1st Mrg-1  lesion is 99% stenosed. SVG to OM occluded.  1st Mrg-2 lesion is 100% stenosed.  Ost Cx to Mid Cx lesion is 99% stenosed.  Origin to Prox Graft lesion before 1st Mrg is 100% stenosed.  LIMA and is normal in caliber. Ostial LIMA stent is 60% narrowed.  Prox RCA lesion is 100% stenosed.  SVG to PDA showed prox Graft lesion is 75% stenosed. Scoring balloon angioplasty was performed using a BALLOON SCOREFLEX 3.0X10. Multipurpose guide catheter was able to track through the subclavian stent.  Post intervention, there is a 0% residual stenosis.  SVG to PDA showed mid Graft lesion is 80% stenosed. A drug-eluting stent was successfully placed using a SYNERGY XD 3.50X16.  Post intervention, there is a 0% residual stenosis.  LV end diastolic pressure is moderately elevated.  There is no aortic valve stenosis.  Origin lesion is 20% stenosed.  Due to the severe in-stent restenosis of the subclavian stent, Dr. Carlis Abbott was called.  I suspect this was causing anterior wall ischemia due to compromising flow into the LIMA.  There was a 30 mm gradient across the stent.  There was clear difficulty getting equipment across the stent to the heart as noted in the report.  He is planning to place a covered stent over the disease to bare-metal stent which was previously placed. From a cardiac standpoint, continue aggressive secondary prevention.  She will need dual antiplatelet therapy.   PERIPHERAL VASCULAR CATHETERIZATION  Result Date: 02/24/2021 Formatting of this result is different from the original. Patient name: Donna Howe          MRN: [id number]        DOB: April 14, 1956        Sex: female   02/24/2021 Pre-operative Diagnosis: In-stent stenosis in left subclavian stent in the setting of NSTEMI and limited flow to LIMA graft Post-operative diagnosis:  Same Surgeon:  Marty Heck, MD Procedure Performed: 1.  Left subclavian arteriogram 2.  Angioplasty of left subclavian stent (4 mm and 5 mm Sterling and 6 mm  Mustang) 3.  Left subclavian stent using a covered stent (7 mm x 19 mm VBX)   Indications: 65 year old female that was admitted with chest pain and found to have an NSTEMI.  Further work-up was concerning for in-stent stenosis of her left subclavian stent in the  setting of LIMA graft after previous CABG.  She had a left subclavian stent placed by Dr. Donzetta Matters last year.  I was called during the procedure to evaluate her left subclavian stent after it was noted there was a 30 mmHg pressure gradient across the stent during cardiac intervention by interventional cardiology.  I did get consent from her son over the phone given intraprocedure consult.   Findings: My initial plan was to realign her left subclavian stent with a covered stent.  Unfortunately I could not get an 035 balloon to cross the in-stent stenosis given it was high-grade and ultimately had to cross the left subclavian stent with an 018 system and predilated this in-stent stenosis with a 4 mm and 5 mm Sterling.  I still could not get the VBX stent to cross so I then upsized to an 035 system using a Rosen wire in the descending thoracic aorta and finally predilated with a 6 mm Mustang.  Finally I was able to get the stent to track across the left subclavian in-stent stenosis and I realigned the previous stent with a 7 mm x 19 mm VBX.  I did make this stent a little more distal in the subclavian artery given it appeared she had some disease in the subclavian distal to the previous stent but below the LIMA graft.  Final pullback pressure showed no significant gradient across the subclavian stent after restenting and there was a 30 mm Hg point gradient at the start of the case.             Procedure: I was called to lab 9 to evaluate her left subclavian stent and brachial access had been previously obtained by Dr. Irish Lack.  In evaluating the images, it did appear that there was a high-grade greater than 70 to 80% in-stent stenosis in the left subclavian stent  and this had a pressure gradient of greater than 30 mmHg as measured by cardiology.  Once Dr. Irish Lack was finished with his portion of the procedure, I then used a versa core wire to exchange for a long 7 [place] destination sheath in the left brachial artery.  I advanced this all the way into the axillary artery.  Initially I planned to realign her existing subclavian stent which was bare-metal with a covered stent.  It became apparent that this was a very tight lesion and I could not even get an 035 catheter to track across the wire in the left subclavian stent and met resistance.  Ultimately initially tried to also cross a 4 mm Mustang balloon unsuccessful.  I then downsized to a V 18 wire and a smaller system and finally was able to get a 4 mm Sterling to cross and predilated the left subclavian in-stent stenosis with a 4 mm Sterling to nominal pressure for 2 minutes.  I then used a 5 mm Sterling to nominal pressure for 2 minutes.  That point time I tried to place a 7 mm VBX inside the existing stent but this would not track even after pre-dilation.  I then used a KMP catheter to cross the stent and exchanged for a stiffer Rosen wire down in the descending thoracic aorta.  Additional 3000 units of IV heparin was given we did check an ACT to maintain greater than 250.  At this point in time I advanced the sheath as far as I could into the left subclavian artery but it would not track across the left subclavian stent.  I then elected to  finally predilate th left subclavian stent with a 6 mm x 20 mm Mustang over the Latta system.  Finally I was able to get the VBX to track after dilation with a 6 mm baloon and I stented her proximal left subclavian making sure the stent was a little more distal given there appeared to be some calcified disease just distal to her previous stent.  This was deployed to nominal pressure inflating it to about 6.5 mm given her previous stent was 6 mm.  Final injection showed filling of the  LIMA graft with no evidence of residual stenosis.  I did put a KMP catheter across the stent in her aortic arch and did a pullback pressure and there was no residual gradient at completion in the left subclavian stent.  Wires and catheters were removed.  She was taken to holding to have the sheath removed.         Marty Heck, MD [facility] Office: [phone]   Cardiac Studies   Coronary/Graft Acute MI Revascularization   02/24/2021  LEFT HEART CATH AND CORS/GRAFTS ANGIOGRAPHY    Conclusion    Ost LM to Dist LM lesion is 95% stenosed. LIMA to LAD is patent. Prox LAD to Mid LAD lesion is 100% stenosed. Known from prior catheterization. Unable to advance left catheter through the severely diseased subclavian stent. Ost LAD to Prox LAD lesion is 100% stenosed. 1st Mrg-1 lesion is 99% stenosed. SVG to OM occluded. 1st Mrg-2 lesion is 100% stenosed. Ost Cx to Mid Cx lesion is 99% stenosed. Origin to Prox Graft lesion before 1st Mrg is 100% stenosed. LIMA and is normal in caliber. Ostial LIMA stent is 60% narrowed. Prox RCA lesion is 100% stenosed. SVG to PDA showed prox Graft lesion is 75% stenosed. Scoring balloon angioplasty was performed using a BALLOON SCOREFLEX 3.0X10. Multipurpose guide catheter was able to track through the subclavian stent. Post intervention, there is a 0% residual stenosis. SVG to PDA showed mid Graft lesion is 80% stenosed. A drug-eluting stent was successfully placed using a SYNERGY XD 3.50X16. Post intervention, there is a 0% residual stenosis. LV end diastolic pressure is moderately elevated. There is no aortic valve stenosis. Origin lesion is 20% stenosed.   Due to the severe in-stent restenosis of the subclavian stent, Dr. Carlis Abbott was called.  I suspect this was causing anterior wall ischemia due to compromising flow into the LIMA.  There was a 30 mm gradient across the stent.  There was clear difficulty getting equipment  across the stent to the heart as noted in the report.  He is planning to place a covered stent over the disease to bare-metal stent which was previously placed.   From a cardiac standpoint, continue aggressive secondary prevention.  She will need dual antiplatelet therapy.  Diagnostic Dominance: Co-dominant    Intervention    PERIPHERAL VASCULAR INTERVENTION    Conclusion  Patient name: Donna Howe          MRN: [id number]        DOB: [birth year]        Sex: female   02/24/2021 Pre-operative Diagnosis: In-stent stenosis in left subclavian stent in the setting of NSTEMI and limited flow to LIMA graft Post-operative diagnosis:  Same Surgeon:  Marty Heck, MD Procedure Performed: 1.  Left subclavian arteriogram  2.  Angioplasty of left subclavian stent (4 mm and 5 mm Sterling and 6 mm Mustang) 3.  Left subclavian stent using a covered  stent (7 mm x 19 mm VBX)   Patient Profile     65 y.o. female with hx of CAD s/p CABG in 2004 and multiple stent procedures since then including stent of ostial LIMA to LAD and more recently SVG to RCA in April  2022 ESRD on HD TThS, HTN, HLD, DM II, Left subclavian artery stenosis s/p stent 11/15/2021and carotid artery disease s/p R CEA 2014 transferred from [facility] to [facility] with NSTEMI.   Patient recently underwent cath on 01/10/2021, LIMA graft had 60%, 75% ulcerated lesion in SVG-RCA treated with 2.5x85m DES, unable engage LIMA-LAD due to stent extending into the aorta, known occluded SVG-OM-LPL, elevated LVEDP 22 mmHg. Recommended myoview to check for anterior ischemia. Since discharge patient continue to notice significant left sided chest pain under the left breast radiating to the left flank. [facility] to have [facility] trop[facility].  Assessment & Plan    NSTEMI with hx of CABG and multiple intervention - EKG with more pronounced ST/T wave depression compared to prior. - Hs-troponin peaked at 1277 -  Detailed cath as above s/p DES to SVG to PDA x 2. She was found to have limited flow to LIMA 2nd to severe in-stent restenosis of  left subclavian stent with 30 mm gradient across the stent s/p angioplasty and stenting by Dr. CCarlis Abbott - Continue aspirin and Plavix - Continue statin and Renexa - Add Zetia  - Discussed aggressive lifestyle modifications  - Ambulate - Mild hematoma at access site    2. Left subclavian artery stenosis s/p stent 08/02/2020 - now s/p angioplasty and stenting for in-stent restenosis    3. ESRD on HD - Underwent dialysis yesterday   4. HLD - 02/23/2021: Cholesterol 168; HDL 41; LDL Cholesterol 76; Triglycerides 257; VLDL 51  - Continue statin  - Add Zetia   For questions or updates, please contact [facility] Please consult [email or web address] for contact info under        Signed, BLeanor Kail PA  02/25/2021, 8:00 AM

## 2021-02-25 NOTE — Discharge Summary (Signed)
Physician Discharge Summary  Donna Howe ZRA:[id number] DOB: 07/07/1956 DOA: 02/22/2021  PCP: Curlene Labrum, MD  Admit date: 02/22/2021 Discharge date: 02/25/2021  Admitted From: Home Disposition: Home  Recommendations for Outpatient Follow-up:  Follow up with PCP in 1-2 weeks 1 week follow-up with vascular surgery with left arm arterial duplex Follow-up with cardiology scheduled on 04/06/2021  Home Health: No Equipment/Devices: None  Discharge Condition: Stable CODE STATUS: Full code Diet recommendation: Renal diet  History of present illness:  Donna Howe is a 65 year old female with past medical history significant for CAD s/p PCI with DES x2 to RCA, ESRD on HD TTS, type 2 diabetes mellitus, essential hypertension, morbid obesity and recent cataract surgery who presented to the ED for evaluation of chest pain.  Onset for 24 hours, localized to her left upper breast region with radiation to her left arm.  Occurred at rest.  Chest pain improved with some lingual nitroglycerin tablets, otherwise no appreciable exacerbating or alleviating factors.  Also associated with shortness of breath, diaphoresis.  Denies nausea/vomiting, no dizziness or lightheadedness.   In the ED, temperature 98.5 F, HR 71, RR 16, BP 133/54, SPO2 97% on room air. Sodium 136, potassium 5.3, chloride 99, CO2 27, BUN 66, creatinine 7.04, glucose 189.  WBC 10.4, hemoglobin 10.9, platelets 194.  High sensitive troponin 1038> 1104.  Cova-19 PCR negative.  Influenza A/B PCR negative.Chest x-ray with no acute cardiopulmonary disease process.  EKG with normal sinus rhythm, rate 76, QTc 468, diffuse ST-T changes which are not certainly changed from previous EKG.  Cardiology was consulted.  Patient was transferred to [facility] for further evaluation.  TRH consulted for admission.  Hospital course:  NSTEMI Hx CAD s/p CABG and PCI Patient presenting to the ED after acute onset chest pain with radiation to  her left arm with associated diaphoresis, shortness of breath and during hemodialysis, with improvement of sublingual nitroglycerin.  Patient was noted to have elevated troponin with EKG changes that are similar to previous in appearance.  Patient with history of CABG and recent angioplasty with stent placement April 2022 to RCA.  Troponin trended up to a high of 1226.  Patient was started on a heparin drip.  Cardiology was consulted.  Patient underwent left heart catheterization with balloon angioplasty and stent placement to SVG-PDA graft.  Continue dual antiplatelet therapy with aspirin and Plavix.  Continue atorvastatin 80 mg p.o. daily and ranolazine 500 mg p.o. nightly.  Outpatient follow-up scheduled with cardiology on 04/06/2021.   Hyperkalemia: Resolved Potassium 5.3 on admission.  Received Lokelma 10 g p.o. on 6/7.  Potassium 3.7 at time of discharge.  Continue hemodialysis as scheduled.   Left subclavian artery stenosis s/p stent 2021 Cardiology concern for recurrent stenosis, this was discussed with vascular surgery on 6/8.  Went angioplasty and restenting of her left subclavian stent where she was found to have a high-grade recurrent stenosis on 02/24/2021 by Dr. Carlis Abbott.  Outpatient follow-up with vascular surgery 1 week with left arm arterial duplex scan.   Type 2 diabetes mellitus: Hemoglobin A1c 7.0.  Home regimen includes Novolin 70/30 24u [place] BID.   ESRD on HD: Continue dialysis per schedule.   Anxiety: Continue home Alprazolam 0.25 mg p.o. qAM,qPM and 0.5 mg nightly   Hypothyroidism: TSH 1.057 on 06/18/2020. --Continue levothyroxine 112 mcg p.o. daily   GERD: Continue PPI   COPD: Without acute exacerbation.  Not oxygen dependent at baseline. --Continue nebs as needed   Recent cataract surgery: Reports  recent cataract surgery to left thigh by Dr.Hecker on 02/15/21.  Continue ketorolac and prednisone eyedrops.  Outpatient follow-up with ophthalmology as scheduled.   Morbid  obesity: Body mass index is 35.51 kg/m.  Discussed with patient needs for aggressive lifestyle changes/weight loss as this complicates all facets of care.  Outpatient follow-up with PCP.  Discharge Diagnoses:  Principal Problem:   NSTEMI (non-ST elevated myocardial infarction) ([place]) Active Problems:   Obesity (BMI 30-39.9)   CAD (coronary artery disease)   Diabetes mellitus with ESRD (end-stage renal disease) ([facility])   Hypothyroidism   ESRD on dialysis ([place])   PAD (peripheral artery disease) ([place])   Stenosis of left subclavian artery [facility])    Discharge Instructions  Discharge Instructions     AMB Referral to Cardiac Rehabilitation - Phase II   Complete by: As directed    Diagnosis:  Coronary Stents NSTEMI     After initial evaluation and assessments completed: Virtual Based Care may be provided alone or in conjunction with Phase 2 Cardiac Rehab based on patient barriers.: Yes   Call MD for:  difficulty breathing, headache or visual disturbances   Complete by: As directed    Call MD for:  extreme fatigue   Complete by: As directed    Call MD for:  persistant dizziness or light-headedness   Complete by: As directed    Call MD for:  persistant nausea and vomiting   Complete by: As directed    Call MD for:  severe uncontrolled pain   Complete by: As directed    Call MD for:  temperature >100.4   Complete by: As directed    Diet - low sodium heart healthy   Complete by: As directed    Increase activity slowly   Complete by: As directed       Allergies as of 02/25/2021       Reactions   Penicillins Other (See Comments)   REACTION: Unknown, told as a child Has patient had a PCN reaction causing immediate rash, facial/tongue/throat swelling, SOB or lightheadedness with hypotension: Unknown Has patient had a PCN reaction causing severe rash involving mucus membranes or skin necrosis: Unknown Has patient had a PCN reaction that required hospitalization: Unknown Has patient  had a PCN reaction occurring within the last 10 years: No If all of the above answers are "NO", then may proceed with Cephalosporin use.   Beta Adrenergic Blockers Other (See Comments)   Hypotension        Medication List     STOP taking these medications    diclofenac Sodium 1 % Gel Commonly known as: VOLTAREN       TAKE these medications    Albuterol Sulfate 108 (90 Base) MCG/ACT Aepb Commonly known as: PROAIR RESPICLICK Inhale 2 puffs into the lungs every 4 (four) hours as needed (Shortness of breath).   ALPRAZolam 0.5 MG tablet Commonly known as: XANAX Take 0.25-0.5 mg by mouth See admin instructions. Take 0.25 in the morning and evening and 0.5 mg at bedtime   aspirin EC 81 MG tablet Take 81 mg by mouth daily.   brimonidine 0.2 % ophthalmic solution Commonly known as: ALPHAGAN Place 1 drop into the left eye 3 (three) times daily.   clopidogrel 75 MG tablet Commonly known as: PLAVIX Take 1 tablet (75 mg total) by mouth daily.   ezetimibe 10 MG tablet Commonly known as: ZETIA Take 1 tablet (10 mg total) by mouth daily.   ferric citrate 1 GM 210 MG(Fe) tablet Commonly  known as: AURYXIA Take 420 mg by mouth 3 (three) times daily with meals.   gabapentin 400 MG capsule Commonly known as: NEURONTIN Take 1 capsule (400 mg total) by mouth 2 (two) times daily.   ipratropium 17 MCG/ACT inhaler Commonly known as: ATROVENT HFA Inhale 2 puffs into the lungs every 4 (four) hours as needed for wheezing.   levothyroxine 112 MCG tablet Commonly known as: SYNTHROID Take 112 mcg by mouth daily before breakfast.   nitroGLYCERIN 0.4 MG SL tablet Commonly known as: NITROSTAT Place 1 tablet (0.4 mg total) under the tongue every 5 (five) minutes x 3 doses as needed for chest pain. What changed: additional instructions   NovoLIN 70/30 FlexPen (70-30) 100 UNIT/ML KwikPen Generic drug: insulin isophane & regular human Inject 24 Units into the skin in the morning and at  bedtime.   ondansetron 8 MG tablet Commonly known as: ZOFRAN Take 8 mg by mouth every 8 (eight) hours as needed for nausea.   pantoprazole 40 MG tablet Commonly known as: PROTONIX Take 1 tablet (40 mg total) by mouth 2 (two) times daily.   Vitamin D (Ergocalciferol) 1.25 MG (50000 UNIT) Caps capsule Commonly known as: DRISDOL Take 50,000 Units by mouth every Sunday.       ASK your doctor about these medications    allopurinol 100 MG tablet Commonly known as: Zyloprim Take one tablet after hemodialysis, on Tuesday, Thursday and Saturday.   atorvastatin 80 MG tablet Commonly known as: LIPITOR Take 1 tablet (80 mg total) by mouth every evening.   ranolazine 500 MG 12 hr tablet Commonly known as: RANEXA Take 1 tablet (500 mg total) by mouth 2 (two) times daily.        Follow-up Information     [facility] Follow up in 1 month(s).   Why: The office will call the patient with an appointment Contact information: [address] [place] [phone]        Curlene Labrum, MD. Schedule an appointment as soon as possible for a visit in 1 week(s).   Specialty: Family Medicine Contact information: [place] [address] [phone]         Erma Heritage, PA-C Follow up on 04/06/2021.   Specialties: Librarian, academic, Cardiology Why: @1 :30pm for hospital follow up with Dr. Myles Gip PA/NP Contact information: [place] [address] [phone]                Allergies  Allergen Reactions   Penicillins Other (See Comments)    REACTION: Unknown, told as a child Has patient had a PCN reaction causing immediate rash, facial/tongue/throat swelling, SOB or lightheadedness with hypotension: Unknown Has patient had a PCN reaction causing severe rash involving mucus membranes or skin necrosis: Unknown Has patient had a PCN reaction that required hospitalization: Unknown Has patient had a  PCN reaction occurring within the last 10 years: No If all of the above answers are "NO", then may proceed with Cephalosporin use.    Beta Adrenergic Blockers Other (See Comments)    Hypotension    Consultations: Cardiology Vascular surgery Nephrology   Procedures/Studies: CARDIAC CATHETERIZATION  Result Date: 02/24/2021  Ost LM to Dist LM lesion is 95% stenosed. LIMA to LAD is patent.  Prox LAD to Mid LAD lesion is 100% stenosed. Known from prior catheterization. Unable to advance left catheter through the severely diseased subclavian stent.  Ost LAD to Prox LAD lesion is 100% stenosed.  1st  Mrg-1 lesion is 99% stenosed. SVG to OM occluded.  1st Mrg-2 lesion is 100% stenosed.  Ost Cx to Mid Cx lesion is 99% stenosed.  Origin to Prox Graft lesion before 1st Mrg is 100% stenosed.  LIMA and is normal in caliber. Ostial LIMA stent is 60% narrowed.  Prox RCA lesion is 100% stenosed.  SVG to PDA showed prox Graft lesion is 75% stenosed. Scoring balloon angioplasty was performed using a BALLOON SCOREFLEX 3.0X10. Multipurpose guide catheter was able to track through the subclavian stent.  Post intervention, there is a 0% residual stenosis.  SVG to PDA showed mid Graft lesion is 80% stenosed. A drug-eluting stent was successfully placed using a SYNERGY XD 3.50X16.  Post intervention, there is a 0% residual stenosis.  LV end diastolic pressure is moderately elevated.  There is no aortic valve stenosis.  Origin lesion is 20% stenosed.  Due to the severe in-stent restenosis of the subclavian stent, Dr. Carlis Abbott was called.  I suspect this was causing anterior wall ischemia due to compromising flow into the LIMA.  There was a 30 mm gradient across the stent.  There was clear difficulty getting equipment across the stent to the heart as noted in the report.  He is planning to place a covered stent over the disease to bare-metal stent which was previously placed. From a cardiac standpoint, continue  aggressive secondary prevention.  She will need dual antiplatelet therapy.   PERIPHERAL VASCULAR CATHETERIZATION  Result Date: 02/24/2021 Formatting of this result is different from the original. Patient name: Donna Howe          MRN: [id number]        DOB: December 11, 1955        Sex: female   02/24/2021 Pre-operative Diagnosis: In-stent stenosis in left subclavian stent in the setting of NSTEMI and limited flow to LIMA graft Post-operative diagnosis:  Same Surgeon:  Marty Heck, MD Procedure Performed: 1.  Left subclavian arteriogram 2.  Angioplasty of left subclavian stent (4 mm and 5 mm Sterling and 6 mm Mustang) 3.  Left subclavian stent using a covered stent (7 mm x 19 mm VBX)   Indications: 65 year old female that was admitted with chest pain and found to have an NSTEMI.  Further work-up was concerning for in-stent stenosis of her left subclavian stent in the setting of LIMA graft after previous CABG.  She had a left subclavian stent placed by Dr. Donzetta Matters last year.  I was called during the procedure to evaluate her left subclavian stent after it was noted there was a 30 mmHg pressure gradient across the stent during cardiac intervention by interventional cardiology.  I did get consent from her son over the phone given intraprocedure consult.   Findings: My initial plan was to realign her left subclavian stent with a covered stent.  Unfortunately I could not get an 035 balloon to cross the in-stent stenosis given it was high-grade and ultimately had to cross the left subclavian stent with an 018 system and predilated this in-stent stenosis with a 4 mm and 5 mm Sterling.  I still could not get the VBX stent to cross so I then upsized to an 035 system using a Rosen wire in the descending thoracic aorta and finally predilated with a 6 mm Mustang.  Finally I was able to get the stent to track across the left subclavian in-stent stenosis and I realigned the previous stent with a 7 mm x 19 mm VBX.  I did make  this stent a little more distal in the subclavian artery given it appeared she had some disease in the subclavian distal to the previous stent but below the LIMA graft.  Final pullback pressure showed no significant gradient across the subclavian stent after restenting and there was a 30 mm Hg point gradient at the start of the case.             Procedure: I was called to lab 9 to evaluate her left subclavian stent and brachial access had been previously obtained by Dr. Irish Lack.  In evaluating the images, it did appear that there was a high-grade greater than 70 to 80% in-stent stenosis in the left subclavian stent and this had a pressure gradient of greater than 30 mmHg as measured by cardiology.  Once Dr. Irish Lack was finished with his portion of the procedure, I then used a versa core wire to exchange for a long 7 [place] destination sheath in the left brachial artery.  I advanced this all the way into the axillary artery.  Initially I planned to realign her existing subclavian stent which was bare-metal with a covered stent.  It became apparent that this was a very tight lesion and I could not even get an 035 catheter to track across the wire in the left subclavian stent and met resistance.  Ultimately initially tried to also cross a 4 mm Mustang balloon unsuccessful.  I then downsized to a V 18 wire and a smaller system and finally was able to get a 4 mm Sterling to cross and predilated the left subclavian in-stent stenosis with a 4 mm Sterling to nominal pressure for 2 minutes.  I then used a 5 mm Sterling to nominal pressure for 2 minutes.  That point time I tried to place a 7 mm VBX inside the existing stent but this would not track even after pre-dilation.  I then used a KMP catheter to cross the stent and exchanged for a stiffer Rosen wire down in the descending thoracic aorta.  Additional 3000 units of IV heparin was given we did check an ACT to maintain greater than 250.  At this point in time I advanced  the sheath as far as I could into the left subclavian artery but it would not track across the left subclavian stent.  I then elected to finally predilate th left subclavian stent with a 6 mm x 20 mm Mustang over the [place] system.  Finally I was able to get the VBX to track after dilation with a 6 mm baloon and I stented her proximal left subclavian making sure the stent was a little more distal given there appeared to be some calcified disease just distal to her previous stent.  This was deployed to nominal pressure inflating it to about 6.5 mm given her previous stent was 6 mm.  Final injection showed filling of the LIMA graft with no evidence of residual stenosis.  I did put a KMP catheter across the stent in her aortic arch and did a pullback pressure and there was no residual gradient at completion in the left subclavian stent.  Wires and catheters were removed.  She was taken to holding to have the sheath removed.         Marty Heck, MD [facility] Office: [phone]  DG Chest Port 1 View  Result Date: 02/22/2021 CLINICAL DATA:  Chest pain EXAM: PORTABLE CHEST 1 VIEW COMPARISON:  01/09/2021 FINDINGS: Right tunneled dialysis catheter is  unchanged. Vascular stent is again noted along the aortic arch. Coronary artery stent also present. Postoperative changes of CABG. No new consolidation or edema. No pleural effusion or pneumothorax. Stable cardiomediastinal contours. IMPRESSION: No acute process in the chest. Electronically Signed   By: Macy Mis M.D.   On: 02/22/2021 13:24     Subjective: Patient seen examined at bedside, resting comfortably.  Sitting at edge of bed.  Ready for discharge home.  Denies any further chest pain, no shortness of breath.  Has outpatient follow-up with vascular surgery in 1 week and cardiology scheduled.  No other questions or concerns at this time.  Denies headache, no fever/chills/night sweats, no dizziness, no chest pain, no  palpitations, no shortness of breath, no abdominal pain, no weakness, no fatigue, no fever/chills/night sweats, no nausea/vomiting/diarrhea.  Discharge Exam: Vitals:   02/25/21 0132 02/25/21 0430  BP: (!) 129/51 119/60  Pulse: 81 73  Resp: 16 16  Temp: 97.9 F (36.6 C) 98.8 F (37.1 C)  SpO2: 95% 91%   Vitals:   02/25/21 0121 02/25/21 0132 02/25/21 0300 02/25/21 0430  BP: (!) 113/50 (!) 129/51  119/60  Pulse: 82 81  73  Resp: 17 16  16   Temp: 97.7 F (36.5 C) 97.9 F (36.6 C)  98.8 F (37.1 C)  TempSrc: Oral Oral Oral Oral  SpO2: 97% 95%  91%  Weight: (P) 100 kg     Height:        General: Pt is alert, awake, not in acute distress Cardiovascular: RRR, S1/S2 +, no rubs, no gallops, HD catheter right chest noted Respiratory: CTA bilaterally, no wheezing, no rhonchi, on room air Abdominal: Soft, NT, ND, bowel sounds + Extremities: no edema, no cyanosis    The results of significant diagnostics from this hospitalization (including imaging, microbiology, ancillary and laboratory) are listed below for reference.     Microbiology: Recent Results (from the past 240 hour(s))  Resp Panel by RT-PCR (Flu A&B, Covid) Nasopharyngeal Swab     Status: None   Collection Time: 02/22/21 12:04 PM   Specimen: Nasopharyngeal Swab; Nasopharyngeal(NP) swabs in vial transport medium  Result Value Ref Range Status   SARS Coronavirus 2 by RT PCR NEGATIVE NEGATIVE Final    Comment: (NOTE) SARS-CoV-2 target nucleic acids are NOT DETECTED.  The SARS-CoV-2 RNA is generally detectable in upper respiratory specimens during the acute phase of infection. The lowest concentration of SARS-CoV-2 viral copies this assay can detect is 138 copies/mL. A negative result does not preclude SARS-Cov-2 infection and should not be used as the sole basis for treatment or other patient management decisions. A negative result may occur with  improper specimen collection/handling, submission of specimen  other than nasopharyngeal swab, presence of viral mutation(s) within the areas targeted by this assay, and inadequate number of viral copies(<138 copies/mL). A negative result must be combined with clinical observations, patient history, and epidemiological information. The expected result is Negative.  Fact Sheet for Patients:  EntrepreneurPulse.com.au  Fact Sheet for Healthcare Providers:  IncredibleEmployment.be  This test is no t yet approved or cleared by the [place] FDA and  has been authorized for detection and/or diagnosis of SARS-CoV-2 by FDA under an Emergency Use Authorization (EUA). This EUA will remain  in effect (meaning this test can be used) for the duration of the COVID-19 declaration under Section 564(b)(1) of the Act, 21 U.S.C.section 360bbb-3(b)(1), unless the authorization is terminated  or revoked sooner.       Influenza A by PCR  NEGATIVE NEGATIVE Final   Influenza B by PCR NEGATIVE NEGATIVE Final    Comment: (NOTE) The Xpert Xpress SARS-CoV-2/FLU/RSV plus assay is intended as an aid in the diagnosis of influenza from Nasopharyngeal swab specimens and should not be used as a sole basis for treatment. Nasal washings and aspirates are unacceptable for Xpert Xpress SARS-CoV-2/FLU/RSV testing.  Fact Sheet for Patients: EntrepreneurPulse.com.au  Fact Sheet for Healthcare Providers: IncredibleEmployment.be  This test is not yet approved or cleared by the [place] FDA and has been authorized for detection and/or diagnosis of SARS-CoV-2 by FDA under an Emergency Use Authorization (EUA). This EUA will remain in effect (meaning this test can be used) for the duration of the COVID-19 declaration under Section 564(b)(1) of the Act, 21 U.S.C. section 360bbb-3(b)(1), unless the authorization is terminated or revoked.  Performed at [facility], [address] [place], [place]  [address]      Labs: BNP (last 3 results) Recent Labs    06/18/20 1549  BNP 9,450.3*   Basic Metabolic Panel: Recent Labs  Lab 02/22/21 1244 02/23/21 0446 02/23/21 0638 02/24/21 0106 02/25/21 0418  NA 136 136 134* 132* 132*  K 5.3* 5.6* 5.4* 4.7 3.7  CL 99 99 98 98 97*  CO2 27 21* 26 27 27   GLUCOSE 189* 176* 235* 208* 257*  BUN 66* 69* 69* 29* 14  CREATININE 7.04* 7.23* 7.21* 4.13* 2.68*  CALCIUM 9.2 9.3 8.7* 8.8* 8.6*  PHOS  --   --  6.5*  --   --    Liver Function Tests: Recent Labs  Lab 02/23/21 0638  ALBUMIN 2.7*   No results for input(s): LIPASE, AMYLASE in the last 168 hours. No results for input(s): AMMONIA in the last 168 hours. CBC: Recent Labs  Lab 02/22/21 1244 02/23/21 1425 02/24/21 0106 02/25/21 0418  WBC 10.4 9.1 7.8 8.4  HGB 10.9* 10.7* 9.4* 9.7*  HCT 34.3* 33.0* 29.3* 29.6*  MCV 104.3* 102.2* 102.4* 101.0*  PLT 194 162 148* 161   Cardiac Enzymes: No results for input(s): CKTOTAL, CKMB, CKMBINDEX, TROPONINI in the last 168 hours. BNP: Invalid input(s): POCBNP CBG: Recent Labs  Lab 02/23/21 2122 02/24/21 0751 02/24/21 1500 02/24/21 2049 02/25/21 0724  GLUCAP 105* 185* 86 203* 243*   D-Dimer No results for input(s): DDIMER in the last 72 hours. Hgb A1c Recent Labs    02/22/21 1647  HGBA1C 6.9*   Lipid Profile Recent Labs    02/23/21 0446  CHOL 168  HDL 41  LDLCALC 76  TRIG 257*  CHOLHDL 4.1   Thyroid function studies No results for input(s): TSH, T4TOTAL, T3FREE, THYROIDAB in the last 72 hours.  Invalid input(s): FREET3 Anemia work up No results for input(s): VITAMINB12, FOLATE, FERRITIN, TIBC, IRON, RETICCTPCT in the last 72 hours. Urinalysis    Component Value Date/Time   COLORURINE YELLOW 06/18/2020 1536   APPEARANCEUR CLOUDY (A) 06/18/2020 1536   LABSPEC 1.015 06/18/2020 1536   PHURINE 5.0 06/18/2020 1536   GLUCOSEU 150 (A) 06/18/2020 1536   HGBUR NEGATIVE 06/18/2020 [place] 06/18/2020  1536   KETONESUR NEGATIVE 06/18/2020 1536   PROTEINUR >=300 (A) 06/18/2020 1536   UROBILINOGEN 0.2 10/26/2013 2229   NITRITE NEGATIVE 06/18/2020 1536   LEUKOCYTESUR NEGATIVE 06/18/2020 1536   Sepsis Labs Invalid input(s): PROCALCITONIN,  WBC,  LACTICIDVEN Microbiology Recent Results (from the past 240 hour(s))  Resp Panel by RT-PCR (Flu A&B, Covid) Nasopharyngeal Swab     Status: None   Collection Time: 02/22/21  12:04 PM   Specimen: Nasopharyngeal Swab; Nasopharyngeal(NP) swabs in vial transport medium  Result Value Ref Range Status   SARS Coronavirus 2 by RT PCR NEGATIVE NEGATIVE Final    Comment: (NOTE) SARS-CoV-2 target nucleic acids are NOT DETECTED.  The SARS-CoV-2 RNA is generally detectable in upper respiratory specimens during the acute phase of infection. The lowest concentration of SARS-CoV-2 viral copies this assay can detect is 138 copies/mL. A negative result does not preclude SARS-Cov-2 infection and should not be used as the sole basis for treatment or other patient management decisions. A negative result may occur with  improper specimen collection/handling, submission of specimen other than nasopharyngeal swab, presence of viral mutation(s) within the areas targeted by this assay, and inadequate number of viral copies(<138 copies/mL). A negative result must be combined with clinical observations, patient history, and epidemiological information. The expected result is Negative.  Fact Sheet for Patients:  EntrepreneurPulse.com.au  Fact Sheet for Healthcare Providers:  IncredibleEmployment.be  This test is no t yet approved or cleared by the [place] FDA and  has been authorized for detection and/or diagnosis of SARS-CoV-2 by FDA under an Emergency Use Authorization (EUA). This EUA will remain  in effect (meaning this test can be used) for the duration of the COVID-19 declaration under Section 564(b)(1) of the Act,  21 U.S.C.section 360bbb-3(b)(1), unless the authorization is terminated  or revoked sooner.       Influenza A by PCR NEGATIVE NEGATIVE Final   Influenza B by PCR NEGATIVE NEGATIVE Final    Comment: (NOTE) The Xpert Xpress SARS-CoV-2/FLU/RSV plus assay is intended as an aid in the diagnosis of influenza from Nasopharyngeal swab specimens and should not be used as a sole basis for treatment. Nasal washings and aspirates are unacceptable for Xpert Xpress SARS-CoV-2/FLU/RSV testing.  Fact Sheet for Patients: EntrepreneurPulse.com.au  Fact Sheet for Healthcare Providers: IncredibleEmployment.be  This test is not yet approved or cleared by the [place] FDA and has been authorized for detection and/or diagnosis of SARS-CoV-2 by FDA under an Emergency Use Authorization (EUA). This EUA will remain in effect (meaning this test can be used) for the duration of the COVID-19 declaration under Section 564(b)(1) of the Act, 21 U.S.C. section 360bbb-3(b)(1), unless the authorization is terminated or revoked.  Performed at [facility], [address] [place], [place] [address]      Time coordinating discharge: Over 30 minutes  SIGNED:   Terri Malerba J [place] DO  [facility] 02/25/2021, 11:14 AM

## 2021-02-25 NOTE — Progress Notes (Signed)
CARDIAC REHAB PHASE I   PRE:  Rate/Rhythm: 63 SR    BP: sitting 114/54    SaO2:   MODE:  Ambulation: 400 ft   POST:  Rate/Rhythm: 90 SR    BP: sitting 139/49     SaO2: 95 RA  Pt unsteady at times, sways. Likes to hold to wall. HHA in hall, which helped. She has cane, RW, and rollator at home to use if needed. No CP, less SOB today.   Discussed need for Plavix/ASA, increasing exercise as tolerated, tobacco cessation, NTG and CRPII. Pt receptive and admits that she enjoys smoking but concedes that it affects her arteries. Encouraged distractions and substitutions since she has quit many times before. Will refer to  [facility] however she has not been able to do in past due to high copay. Defer diet to HD RD. Baldwin, ACSM 02/25/2021 8:38 AM

## 2021-02-25 NOTE — Care Management Important Message (Addendum)
Important Message  Patient Details  Name: Donna Howe MRN: [id number] Date of Birth: 29-Aug-1956   Medicare Important Message Given:  Yes Pt. has already D/C mail IM letter to their home.     Holli Humbles Smith 02/25/2021, 10:24 AM

## 2021-02-25 NOTE — Progress Notes (Signed)
Pt is alert and oriented. Discharge instructions/ AVS given to pt. 

## 2021-02-25 NOTE — Progress Notes (Signed)
[facility] NEPHROLOGY PROGRESS NOTE  Assessment/ Plan: Pt is a 65 y.o. yo female  with ESRD on HD TTS, CAD s/p CABG 2005 and  PCI 12/2020, HTN, HL, PAD, DM who is currently admitted for NSTEMI after presenting with CP and dyspnea and is seen by nephrology for evaluation and management of ESRD.   Outpt HD orders: [facility] TTS 2nd shift 180 NR, 400/500, EDW 92.5, 4hrs, 2K/2.5Ca Calcitriol 1 TIW 6/2 PTH 129, 01/2021 phos 6.5, Hb 12 Recent post weights 92.3kg - 94.4kg.  #NSTEMI: Presented with chest pain and EKG changes.  Seen by cardiologist and underwent cardiac cath on 6/9 s/p DES to SVG to PDA x 2.  She was also found to have limited flow to LIMA and severe in stent restenosis of the left subclavian stent for which patient had angioplasty and restenting by Dr. Carlis Abbott.  Plan to continue aspirin, Plavix, statin.  Patient was chest pain-free this morning.  # ESRD: Status post dialysis yesterday with 2.3 L ultrafiltration, tolerated well.  The plan is to discharge home today.  She can resume outpatient dialysis.  # Anemia: Hemoglobin at goal on admission.  Not on ESA.  # Secondary hyperparathyroidism: Continue Auryxia, calcitriol.  Monitor calcium phosphorus level.  # HTN/volume: Blood pressure acceptable.  Volume management dialysis.  #Hyperkalemia, hyponatremia: Managed with dialysis.     Subjective: Seen and examined.  She had vascular procedure and had dialysis yesterday.  She tolerated well.  She feels much better without any chest pain, shortness of breath this morning.   Objective Vital signs in last 24 hours: Vitals:   02/25/21 0121 02/25/21 0132 02/25/21 0300 02/25/21 0430  BP: (!) 113/50 (!) 129/51  119/60  Pulse: 82 81  73  Resp: _0 Temp: 97.7 F (36.5 C) 97.9 F (36.6 C)  98.8 F (37.1 C)  TempSrc: Oral Oral Oral Oral  SpO2: 97% 95%  91%  Weight: (P) 100 kg     Height:       Weight change: 2.7 kg  Intake/Output Summary (Last 24 hours) at 02/25/2021  0916 Last data filed at 02/25/2021 0121 Gross per 24 hour  Intake 240 ml  Output 2375 ml  Net -2135 ml        Labs: Basic Metabolic Panel: Recent Labs  Lab 02/23/21 0638 02/24/21 0106 02/25/21 0418  NA 134* 132* 132*  K 5.4* 4.7 3.7  CL 98 98 97*  CO2 _1 GLUCOSE 235* 208* 257*  BUN 69* 29* 14  CREATININE 7.21* 4.13* 2.68*  CALCIUM 8.7* 8.8* 8.6*  PHOS 6.5*  --   --     Liver Function Tests: Recent Labs  Lab 02/23/21 0638  ALBUMIN 2.7*    No results for input(s): LIPASE, AMYLASE in the last 168 hours. No results for input(s): AMMONIA in the last 168 hours. CBC: Recent Labs  Lab 02/22/21 1244 02/23/21 1425 02/24/21 0106 02/25/21 0418  WBC 10.4 9.1 7.8 8.4  HGB 10.9* 10.7* 9.4* 9.7*  HCT 34.3* 33.0* 29.3* 29.6*  MCV 104.3* 102.2* 102.4* 101.0*  PLT 194 162 148* 161    Cardiac Enzymes: No results for input(s): CKTOTAL, CKMB, CKMBINDEX, TROPONINI in the last 168 hours. CBG: Recent Labs  Lab 02/23/21 2122 02/24/21 0751 02/24/21 1500 02/24/21 2049 02/25/21 0724  GLUCAP 105* 185* 86 203* 243*     Iron Studies: No results for input(s): IRON, TIBC, TRANSFERRIN, FERRITIN in the last 72 hours. Studies/Results: CARDIAC CATHETERIZATION  Result Date:  02/24/2021  Ost LM to Dist LM lesion is 95% stenosed. LIMA to LAD is patent.  Prox LAD to Mid LAD lesion is 100% stenosed. Known from prior catheterization. Unable to advance left catheter through the severely diseased subclavian stent.  Ost LAD to Prox LAD lesion is 100% stenosed.  1st Mrg-1 lesion is 99% stenosed. SVG to OM occluded.  1st Mrg-2 lesion is 100% stenosed.  Ost Cx to Mid Cx lesion is 99% stenosed.  Origin to Prox Graft lesion before 1st Mrg is 100% stenosed.  LIMA and is normal in caliber. Ostial LIMA stent is 60% narrowed.  Prox RCA lesion is 100% stenosed.  SVG to PDA showed prox Graft lesion is 75% stenosed. Scoring balloon angioplasty was performed using a BALLOON SCOREFLEX 3.0X10.  Multipurpose guide catheter was able to track through the subclavian stent.  Post intervention, there is a 0% residual stenosis.  SVG to PDA showed mid Graft lesion is 80% stenosed. A drug-eluting stent was successfully placed using a SYNERGY XD 3.50X16.  Post intervention, there is a 0% residual stenosis.  LV end diastolic pressure is moderately elevated.  There is no aortic valve stenosis.  Origin lesion is 20% stenosed.  Due to the severe in-stent restenosis of the subclavian stent, Dr. Carlis Abbott was called.  I suspect this was causing anterior wall ischemia due to compromising flow into the LIMA.  There was a 30 mm gradient across the stent.  There was clear difficulty getting equipment across the stent to the heart as noted in the report.  He is planning to place a covered stent over the disease to bare-metal stent which was previously placed. From a cardiac standpoint, continue aggressive secondary prevention.  She will need dual antiplatelet therapy.   PERIPHERAL VASCULAR CATHETERIZATION  Result Date: 02/24/2021 Formatting of this result is different from the original. Patient name: Donna Howe          MRN: [id number]        DOB: 1956/01/13        Sex: female   02/24/2021 Pre-operative Diagnosis: In-stent stenosis in left subclavian stent in the setting of NSTEMI and limited flow to LIMA graft Post-operative diagnosis:  Same Surgeon:  Marty Heck, MD Procedure Performed: 1.  Left subclavian arteriogram 2.  Angioplasty of left subclavian stent (4 mm and 5 mm Sterling and 6 mm Mustang) 3.  Left subclavian stent using a covered stent (7 mm x 19 mm VBX)   Indications: 65 year old female that was admitted with chest pain and found to have an NSTEMI.  Further work-up was concerning for in-stent stenosis of her left subclavian stent in the setting of LIMA graft after previous CABG.  She had a left subclavian stent placed by Dr. Donzetta Matters last year.  I was called during the procedure to evaluate her left  subclavian stent after it was noted there was a 30 mmHg pressure gradient across the stent during cardiac intervention by interventional cardiology.  I did get consent from her son over the phone given intraprocedure consult.   Findings: My initial plan was to realign her left subclavian stent with a covered stent.  Unfortunately I could not get an 035 balloon to cross the in-stent stenosis given it was high-grade and ultimately had to cross the left subclavian stent with an 018 system and predilated this in-stent stenosis with a 4 mm and 5 mm Sterling.  I still could not get the VBX stent to cross so I then upsized to an 035  system using a Rosen wire in the descending thoracic aorta and finally predilated with a 6 mm Mustang.  Finally I was able to get the stent to track across the left subclavian in-stent stenosis and I realigned the previous stent with a 7 mm x 19 mm VBX.  I did make this stent a little more distal in the subclavian artery given it appeared she had some disease in the subclavian distal to the previous stent but below the LIMA graft.  Final pullback pressure showed no significant gradient across the subclavian stent after restenting and there was a 30 mm Hg point gradient at the start of the case.             Procedure: I was called to lab 9 to evaluate her left subclavian stent and brachial access had been previously obtained by Dr. Irish Lack.  In evaluating the images, it did appear that there was a high-grade greater than 70 to 80% in-stent stenosis in the left subclavian stent and this had a pressure gradient of greater than 30 mmHg as measured by cardiology.  Once Dr. Irish Lack was finished with his portion of the procedure, I then used a versa core wire to exchange for a long 7 [place] destination sheath in the left brachial artery.  I advanced this all the way into the axillary artery.  Initially I planned to realign her existing subclavian stent which was bare-metal with a covered stent.  It  became apparent that this was a very tight lesion and I could not even get an 035 catheter to track across the wire in the left subclavian stent and met resistance.  Ultimately initially tried to also cross a 4 mm Mustang balloon unsuccessful.  I then downsized to a V 18 wire and a smaller system and finally was able to get a 4 mm Sterling to cross and predilated the left subclavian in-stent stenosis with a 4 mm Sterling to nominal pressure for 2 minutes.  I then used a 5 mm Sterling to nominal pressure for 2 minutes.  That point time I tried to place a 7 mm VBX inside the existing stent but this would not track even after pre-dilation.  I then used a KMP catheter to cross the stent and exchanged for a stiffer Rosen wire down in the descending thoracic aorta.  Additional 3000 units of IV heparin was given we did check an ACT to maintain greater than 250.  At this point in time I advanced the sheath as far as I could into the left subclavian artery but it would not track across the left subclavian stent.  I then elected to finally predilate th left subclavian stent with a 6 mm x 20 mm Mustang over the [place] system.  Finally I was able to get the VBX to track after dilation with a 6 mm baloon and I stented her proximal left subclavian making sure the stent was a little more distal given there appeared to be some calcified disease just distal to her previous stent.  This was deployed to nominal pressure inflating it to about 6.5 mm given her previous stent was 6 mm.  Final injection showed filling of the LIMA graft with no evidence of residual stenosis.  I did put a KMP catheter across the stent in her aortic arch and did a pullback pressure and there was no residual gradient at completion in the left subclavian stent.  Wires and catheters were removed.  She was taken to holding to  have the sheath removed.         Marty Heck, MD [facility] Office: [phone]     Medications: Infusions:  sodium chloride      Scheduled Medications:  allopurinol  100 mg Oral Q T,Th,Sa-HD   ALPRAZolam  0.25 mg Oral BID AC   ALPRAZolam  0.5 mg Oral QHS   aspirin EC  81 mg Oral Daily   atorvastatin  80 mg Oral QHS   calcitRIOL  1 mcg Oral QODAY   Chlorhexidine Gluconate Cloth  6 each Topical Q0600   Chlorhexidine Gluconate Cloth  6 each Topical Q0600   Chlorhexidine Gluconate Cloth  6 each Topical Q0600   clopidogrel  75 mg Oral Q breakfast   ezetimibe  10 mg Oral Daily   ferric citrate  420 mg Oral TID WC   gabapentin  400 mg Oral BID   heparin sodium (porcine)       insulin aspart  0-6 Units Subcutaneous TID WC   insulin aspart protamine- aspart  10 Units Subcutaneous BID WC   ketorolac  1 drop Left Eye QID   levothyroxine  112 mcg Oral QAC breakfast   pantoprazole  40 mg Oral BID   prednisoLONE acetate  1 drop Left Eye TID   Followed by   Derrill Memo ON 03/02/2021] prednisoLONE acetate  1 drop Left Eye BID   Followed by   Derrill Memo ON 03/09/2021] prednisoLONE acetate  1 drop Left Eye Daily   ranolazine  500 mg Oral QHS   sodium chloride flush  3 mL Intravenous Q12H   [START ON 02/27/2021] Vitamin D (Ergocalciferol)  50,000 Units Oral Q Sun    have reviewed scheduled and prn medications.  Physical Exam: General:NAD, comfortable Heart:RRR, s1s2 nl Lungs: Clear bilateral, no wheezing or crackle. Abdomen:soft, Non-tender, non-distended Extremities:No LE edema Dialysis Access: Right IJ TDC site clean.  Elizebath Wever Prasad Maelin Kurkowski 02/25/2021,9:16 AM  LOS: 3 days

## 2021-02-25 NOTE — Progress Notes (Signed)
[facility]  Subjective  -states the numbness in her left hand is somewhat improved  Objective 119/60 73 98.8 F (37.1 C) (Oral) 16 91%  Intake/Output Summary (Last 24 hours) at 02/25/2021 0952 Last data filed at 02/25/2021 0121 Gross per 24 hour  Intake 240 ml  Output 2375 ml  Net -2135 ml    Bruising at left brachial artery access site Palpable left brachial pulse in upper arm Left radial and ulnar signals monophasic at the wrist  Laboratory Lab Results: Recent Labs    02/24/21 0106 02/25/21 0418  WBC 7.8 8.4  HGB 9.4* 9.7*  HCT 29.3* 29.6*  PLT 148* 161   BMET Recent Labs    02/24/21 0106 02/25/21 0418  NA 132* 132*  K 4.7 3.7  CL 98 97*  CO2 27 27  GLUCOSE 208* 257*  BUN 29* 14  CREATININE 4.13* 2.68*  CALCIUM 8.8* 8.6*    COAG Lab Results  Component Value Date   INR 1.1 01/10/2021   INR 1.0 11/26/2019   INR 1.0 10/02/2019   No results found for: PTT  Assessment/Planning:  Postop day 1 status post angioplasty and restenting of her left subclavian stent where she had a high-grade recurrent stenosis in a previous subclavian stent that was the inflow for her LIMA graft in the setting of NSTEMI.  I think she had an excellent result yesterday and does have a palpable brachial pulse in the upper arm.  She has monophasic signals at the wrist but has a history of steal and states her hand numbness feels better since stenting.  She will need to continue aspirin Plavix from my standpoint.  We will arrange follow-up in 1 month with left arm arterial duplex.  Marty Heck 02/25/2021 9:52 AM --

## 2021-02-26 ENCOUNTER — Telehealth: Payer: Self-pay | Admitting: Nephrology

## 2021-02-26 DIAGNOSIS — E1122 Type 2 diabetes mellitus with diabetic chronic kidney disease: Secondary | ICD-10-CM | POA: Diagnosis not present

## 2021-02-26 DIAGNOSIS — D689 Coagulation defect, unspecified: Secondary | ICD-10-CM | POA: Diagnosis not present

## 2021-02-26 DIAGNOSIS — N2581 Secondary hyperparathyroidism of renal origin: Secondary | ICD-10-CM | POA: Diagnosis not present

## 2021-02-26 DIAGNOSIS — N186 End stage renal disease: Secondary | ICD-10-CM | POA: Diagnosis not present

## 2021-02-26 DIAGNOSIS — I214 Non-ST elevation (NSTEMI) myocardial infarction: Secondary | ICD-10-CM | POA: Diagnosis not present

## 2021-02-26 DIAGNOSIS — Z992 Dependence on renal dialysis: Secondary | ICD-10-CM | POA: Diagnosis not present

## 2021-02-26 NOTE — Telephone Encounter (Signed)
Transition of Care Contact from [place]  Date of Discharge: 02/25/21 Date of Contact: 02/26/21 Method of contact: phone - attempted  Attempted to contact patient to discuss transition of care from inpatient admission.  Patient did not answer the phone.  Message was left on patient's voicemail informing them we would attempt to call them again and if unable to reach will follow up at dialysis.  Jen Mow, PA-C [facility] Pager: [phone]

## 2021-02-28 ENCOUNTER — Telehealth: Payer: Self-pay

## 2021-02-28 NOTE — Telephone Encounter (Signed)
Pt called requesting to r/s surgery with Dr. Donzetta Matters for a later date in July since recently in hospital. Pt scheduled for next available date of 04/13/21. Instructions provided. Pt will continue ASA and Plavix. Voiced understanding.

## 2021-03-01 DIAGNOSIS — D689 Coagulation defect, unspecified: Secondary | ICD-10-CM | POA: Diagnosis not present

## 2021-03-01 DIAGNOSIS — Z992 Dependence on renal dialysis: Secondary | ICD-10-CM | POA: Diagnosis not present

## 2021-03-01 DIAGNOSIS — N2581 Secondary hyperparathyroidism of renal origin: Secondary | ICD-10-CM | POA: Diagnosis not present

## 2021-03-01 DIAGNOSIS — E8779 Other fluid overload: Secondary | ICD-10-CM | POA: Insufficient documentation

## 2021-03-01 DIAGNOSIS — N186 End stage renal disease: Secondary | ICD-10-CM | POA: Diagnosis not present

## 2021-03-01 DIAGNOSIS — I214 Non-ST elevation (NSTEMI) myocardial infarction: Secondary | ICD-10-CM | POA: Diagnosis not present

## 2021-03-01 DIAGNOSIS — E1122 Type 2 diabetes mellitus with diabetic chronic kidney disease: Secondary | ICD-10-CM | POA: Diagnosis not present

## 2021-03-02 DIAGNOSIS — N186 End stage renal disease: Secondary | ICD-10-CM | POA: Diagnosis not present

## 2021-03-02 DIAGNOSIS — Z992 Dependence on renal dialysis: Secondary | ICD-10-CM | POA: Diagnosis not present

## 2021-03-02 DIAGNOSIS — N2581 Secondary hyperparathyroidism of renal origin: Secondary | ICD-10-CM | POA: Diagnosis not present

## 2021-03-02 DIAGNOSIS — E8779 Other fluid overload: Secondary | ICD-10-CM | POA: Diagnosis not present

## 2021-03-03 DIAGNOSIS — D689 Coagulation defect, unspecified: Secondary | ICD-10-CM | POA: Diagnosis not present

## 2021-03-03 DIAGNOSIS — E1122 Type 2 diabetes mellitus with diabetic chronic kidney disease: Secondary | ICD-10-CM | POA: Diagnosis not present

## 2021-03-03 DIAGNOSIS — N2581 Secondary hyperparathyroidism of renal origin: Secondary | ICD-10-CM | POA: Diagnosis not present

## 2021-03-03 DIAGNOSIS — Z992 Dependence on renal dialysis: Secondary | ICD-10-CM | POA: Diagnosis not present

## 2021-03-03 DIAGNOSIS — N186 End stage renal disease: Secondary | ICD-10-CM | POA: Diagnosis not present

## 2021-03-07 DIAGNOSIS — H25011 Cortical age-related cataract, right eye: Secondary | ICD-10-CM | POA: Diagnosis not present

## 2021-03-07 DIAGNOSIS — H2511 Age-related nuclear cataract, right eye: Secondary | ICD-10-CM | POA: Diagnosis not present

## 2021-03-07 DIAGNOSIS — H2589 Other age-related cataract: Secondary | ICD-10-CM | POA: Diagnosis not present

## 2021-03-07 DIAGNOSIS — H25041 Posterior subcapsular polar age-related cataract, right eye: Secondary | ICD-10-CM | POA: Diagnosis not present

## 2021-03-08 DIAGNOSIS — N2581 Secondary hyperparathyroidism of renal origin: Secondary | ICD-10-CM | POA: Diagnosis not present

## 2021-03-08 DIAGNOSIS — D689 Coagulation defect, unspecified: Secondary | ICD-10-CM | POA: Diagnosis not present

## 2021-03-08 DIAGNOSIS — N186 End stage renal disease: Secondary | ICD-10-CM | POA: Diagnosis not present

## 2021-03-08 DIAGNOSIS — Z992 Dependence on renal dialysis: Secondary | ICD-10-CM | POA: Diagnosis not present

## 2021-03-08 DIAGNOSIS — D631 Anemia in chronic kidney disease: Secondary | ICD-10-CM | POA: Diagnosis not present

## 2021-03-09 ENCOUNTER — Ambulatory Visit: Payer: Medicare HMO | Admitting: Student

## 2021-03-10 DIAGNOSIS — N2581 Secondary hyperparathyroidism of renal origin: Secondary | ICD-10-CM | POA: Diagnosis not present

## 2021-03-10 DIAGNOSIS — N186 End stage renal disease: Secondary | ICD-10-CM | POA: Diagnosis not present

## 2021-03-10 DIAGNOSIS — D631 Anemia in chronic kidney disease: Secondary | ICD-10-CM | POA: Diagnosis not present

## 2021-03-10 DIAGNOSIS — Z992 Dependence on renal dialysis: Secondary | ICD-10-CM | POA: Diagnosis not present

## 2021-03-10 DIAGNOSIS — D689 Coagulation defect, unspecified: Secondary | ICD-10-CM | POA: Diagnosis not present

## 2021-03-11 ENCOUNTER — Telehealth: Payer: Self-pay | Admitting: Student

## 2021-03-11 ENCOUNTER — Telehealth: Payer: Self-pay | Admitting: Cardiology

## 2021-03-11 ENCOUNTER — Ambulatory Visit (HOSPITAL_COMMUNITY): Payer: Medicare HMO

## 2021-03-11 NOTE — Telephone Encounter (Signed)
New message     Dr Monna Fam sent a fax to our office stating they needs a definitive Yes or No , for the patient to have surgery next week   See surgery clearance 02/15/21

## 2021-03-11 NOTE — Telephone Encounter (Signed)
Left message to call back 9:47 AM on 03/11/2021.  Patient is call back.

## 2021-03-11 NOTE — Telephone Encounter (Signed)
   Patient Name: Donna Howe  DOB: 09/11/1956  MRN: [id number]   Primary Cardiologist: Rozann Lesches, MD  Patient recently underwent cardiac catheterization with placement of drug-eluting stent and angioplasty on 02/24/2021.  She will need to continue her antiplatelet therapy through her procedure.  Chart reviewed as part of pre-operative protocol coverage. Cataract extractions are recognized in guidelines as low risk surgeries that do not typically require specific preoperative testing or holding of blood thinner therapy. Therefore, given past medical history and time since last visit, based on ACC/AHA guidelines, Donna Howe would be at acceptable risk for the planned procedure without further cardiovascular testing.   I will route this recommendation to the requesting party via Epic fax function and remove from pre-op pool.  Please call with questions.    Jossie Ng. Gaston Dase NP-C    03/11/2021, 10:34 AM [place] [address] [place] Office [phone] Fax [phone]

## 2021-03-11 NOTE — Telephone Encounter (Signed)
Follow up:   Patient returning a call back.  

## 2021-03-12 DIAGNOSIS — Z992 Dependence on renal dialysis: Secondary | ICD-10-CM | POA: Diagnosis not present

## 2021-03-12 DIAGNOSIS — D689 Coagulation defect, unspecified: Secondary | ICD-10-CM | POA: Diagnosis not present

## 2021-03-12 DIAGNOSIS — N186 End stage renal disease: Secondary | ICD-10-CM | POA: Diagnosis not present

## 2021-03-12 DIAGNOSIS — D631 Anemia in chronic kidney disease: Secondary | ICD-10-CM | POA: Diagnosis not present

## 2021-03-12 DIAGNOSIS — N2581 Secondary hyperparathyroidism of renal origin: Secondary | ICD-10-CM | POA: Diagnosis not present

## 2021-03-14 DIAGNOSIS — Z992 Dependence on renal dialysis: Secondary | ICD-10-CM | POA: Diagnosis not present

## 2021-03-14 DIAGNOSIS — D689 Coagulation defect, unspecified: Secondary | ICD-10-CM | POA: Diagnosis not present

## 2021-03-14 DIAGNOSIS — N2581 Secondary hyperparathyroidism of renal origin: Secondary | ICD-10-CM | POA: Diagnosis not present

## 2021-03-14 DIAGNOSIS — N186 End stage renal disease: Secondary | ICD-10-CM | POA: Diagnosis not present

## 2021-03-14 NOTE — Telephone Encounter (Signed)
Spoke with pt to update on arrival time of 0800 for surgery on 04/13/21 at [facility]. Pt voiced understanding.

## 2021-03-15 DIAGNOSIS — H25811 Combined forms of age-related cataract, right eye: Secondary | ICD-10-CM | POA: Diagnosis not present

## 2021-03-15 DIAGNOSIS — H25041 Posterior subcapsular polar age-related cataract, right eye: Secondary | ICD-10-CM | POA: Diagnosis not present

## 2021-03-15 DIAGNOSIS — H2589 Other age-related cataract: Secondary | ICD-10-CM | POA: Diagnosis not present

## 2021-03-15 DIAGNOSIS — H2511 Age-related nuclear cataract, right eye: Secondary | ICD-10-CM | POA: Diagnosis not present

## 2021-03-15 DIAGNOSIS — H25011 Cortical age-related cataract, right eye: Secondary | ICD-10-CM | POA: Diagnosis not present

## 2021-03-17 DIAGNOSIS — N2581 Secondary hyperparathyroidism of renal origin: Secondary | ICD-10-CM | POA: Diagnosis not present

## 2021-03-17 DIAGNOSIS — N186 End stage renal disease: Secondary | ICD-10-CM | POA: Diagnosis not present

## 2021-03-17 DIAGNOSIS — I70201 Unspecified atherosclerosis of native arteries of extremities, right leg: Secondary | ICD-10-CM | POA: Diagnosis not present

## 2021-03-17 DIAGNOSIS — D689 Coagulation defect, unspecified: Secondary | ICD-10-CM | POA: Diagnosis not present

## 2021-03-17 DIAGNOSIS — E1165 Type 2 diabetes mellitus with hyperglycemia: Secondary | ICD-10-CM | POA: Diagnosis not present

## 2021-03-17 DIAGNOSIS — Z992 Dependence on renal dialysis: Secondary | ICD-10-CM | POA: Diagnosis not present

## 2021-03-17 DIAGNOSIS — I10 Essential (primary) hypertension: Secondary | ICD-10-CM | POA: Diagnosis not present

## 2021-03-17 DIAGNOSIS — Z87891 Personal history of nicotine dependence: Secondary | ICD-10-CM | POA: Diagnosis not present

## 2021-03-17 DIAGNOSIS — Z794 Long term (current) use of insulin: Secondary | ICD-10-CM | POA: Diagnosis not present

## 2021-03-18 ENCOUNTER — Ambulatory Visit (HOSPITAL_COMMUNITY)
Admission: RE | Admit: 2021-03-18 | Discharge: 2021-03-18 | Disposition: A | Discharge status: Home / Self Care | Payer: Medicare HMO | Source: Ambulatory Visit | Attending: Nephrology | Admitting: Nephrology

## 2021-03-18 ENCOUNTER — Other Ambulatory Visit: Payer: Self-pay

## 2021-03-18 DIAGNOSIS — E034 Atrophy of thyroid (acquired): Secondary | ICD-10-CM | POA: Diagnosis not present

## 2021-03-18 DIAGNOSIS — M542 Cervicalgia: Secondary | ICD-10-CM | POA: Insufficient documentation

## 2021-03-19 DIAGNOSIS — D631 Anemia in chronic kidney disease: Secondary | ICD-10-CM | POA: Diagnosis not present

## 2021-03-19 DIAGNOSIS — N186 End stage renal disease: Secondary | ICD-10-CM | POA: Diagnosis not present

## 2021-03-19 DIAGNOSIS — Z992 Dependence on renal dialysis: Secondary | ICD-10-CM | POA: Diagnosis not present

## 2021-03-19 DIAGNOSIS — N2581 Secondary hyperparathyroidism of renal origin: Secondary | ICD-10-CM | POA: Diagnosis not present

## 2021-03-19 DIAGNOSIS — D689 Coagulation defect, unspecified: Secondary | ICD-10-CM | POA: Diagnosis not present

## 2021-03-22 DIAGNOSIS — N186 End stage renal disease: Secondary | ICD-10-CM | POA: Diagnosis not present

## 2021-03-22 DIAGNOSIS — N2581 Secondary hyperparathyroidism of renal origin: Secondary | ICD-10-CM | POA: Diagnosis not present

## 2021-03-22 DIAGNOSIS — Z992 Dependence on renal dialysis: Secondary | ICD-10-CM | POA: Diagnosis not present

## 2021-03-22 DIAGNOSIS — E8779 Other fluid overload: Secondary | ICD-10-CM | POA: Diagnosis not present

## 2021-03-22 DIAGNOSIS — D689 Coagulation defect, unspecified: Secondary | ICD-10-CM | POA: Diagnosis not present

## 2021-03-24 DIAGNOSIS — Z992 Dependence on renal dialysis: Secondary | ICD-10-CM | POA: Diagnosis not present

## 2021-03-24 DIAGNOSIS — N186 End stage renal disease: Secondary | ICD-10-CM | POA: Diagnosis not present

## 2021-03-24 DIAGNOSIS — N2581 Secondary hyperparathyroidism of renal origin: Secondary | ICD-10-CM | POA: Diagnosis not present

## 2021-03-24 DIAGNOSIS — D689 Coagulation defect, unspecified: Secondary | ICD-10-CM | POA: Diagnosis not present

## 2021-03-24 DIAGNOSIS — E8779 Other fluid overload: Secondary | ICD-10-CM | POA: Diagnosis not present

## 2021-03-25 DIAGNOSIS — N2581 Secondary hyperparathyroidism of renal origin: Secondary | ICD-10-CM | POA: Diagnosis not present

## 2021-03-25 DIAGNOSIS — Z992 Dependence on renal dialysis: Secondary | ICD-10-CM | POA: Diagnosis not present

## 2021-03-25 DIAGNOSIS — N186 End stage renal disease: Secondary | ICD-10-CM | POA: Diagnosis not present

## 2021-03-25 DIAGNOSIS — D689 Coagulation defect, unspecified: Secondary | ICD-10-CM | POA: Diagnosis not present

## 2021-03-25 DIAGNOSIS — E8779 Other fluid overload: Secondary | ICD-10-CM | POA: Diagnosis not present

## 2021-03-26 DIAGNOSIS — N186 End stage renal disease: Secondary | ICD-10-CM | POA: Diagnosis not present

## 2021-03-26 DIAGNOSIS — Z992 Dependence on renal dialysis: Secondary | ICD-10-CM | POA: Diagnosis not present

## 2021-03-26 DIAGNOSIS — N2581 Secondary hyperparathyroidism of renal origin: Secondary | ICD-10-CM | POA: Diagnosis not present

## 2021-03-26 DIAGNOSIS — E8779 Other fluid overload: Secondary | ICD-10-CM | POA: Diagnosis not present

## 2021-03-26 DIAGNOSIS — D689 Coagulation defect, unspecified: Secondary | ICD-10-CM | POA: Diagnosis not present

## 2021-03-27 DIAGNOSIS — I10 Essential (primary) hypertension: Secondary | ICD-10-CM | POA: Diagnosis not present

## 2021-03-27 DIAGNOSIS — Z8679 Personal history of other diseases of the circulatory system: Secondary | ICD-10-CM | POA: Diagnosis not present

## 2021-03-27 DIAGNOSIS — Z6834 Body mass index (BMI) 34.0-34.9, adult: Secondary | ICD-10-CM | POA: Diagnosis not present

## 2021-03-27 DIAGNOSIS — N186 End stage renal disease: Secondary | ICD-10-CM | POA: Diagnosis not present

## 2021-03-27 DIAGNOSIS — I214 Non-ST elevation (NSTEMI) myocardial infarction: Secondary | ICD-10-CM | POA: Diagnosis not present

## 2021-03-29 DIAGNOSIS — D631 Anemia in chronic kidney disease: Secondary | ICD-10-CM | POA: Diagnosis not present

## 2021-03-29 DIAGNOSIS — Z992 Dependence on renal dialysis: Secondary | ICD-10-CM | POA: Diagnosis not present

## 2021-03-29 DIAGNOSIS — N2581 Secondary hyperparathyroidism of renal origin: Secondary | ICD-10-CM | POA: Diagnosis not present

## 2021-03-29 DIAGNOSIS — N186 End stage renal disease: Secondary | ICD-10-CM | POA: Diagnosis not present

## 2021-03-29 DIAGNOSIS — D689 Coagulation defect, unspecified: Secondary | ICD-10-CM | POA: Diagnosis not present

## 2021-03-31 DIAGNOSIS — N186 End stage renal disease: Secondary | ICD-10-CM | POA: Diagnosis not present

## 2021-03-31 DIAGNOSIS — Z992 Dependence on renal dialysis: Secondary | ICD-10-CM | POA: Diagnosis not present

## 2021-03-31 DIAGNOSIS — D689 Coagulation defect, unspecified: Secondary | ICD-10-CM | POA: Diagnosis not present

## 2021-03-31 DIAGNOSIS — N2581 Secondary hyperparathyroidism of renal origin: Secondary | ICD-10-CM | POA: Diagnosis not present

## 2021-03-31 DIAGNOSIS — D631 Anemia in chronic kidney disease: Secondary | ICD-10-CM | POA: Diagnosis not present

## 2021-04-02 DIAGNOSIS — D689 Coagulation defect, unspecified: Secondary | ICD-10-CM | POA: Diagnosis not present

## 2021-04-02 DIAGNOSIS — N186 End stage renal disease: Secondary | ICD-10-CM | POA: Diagnosis not present

## 2021-04-02 DIAGNOSIS — N2581 Secondary hyperparathyroidism of renal origin: Secondary | ICD-10-CM | POA: Diagnosis not present

## 2021-04-02 DIAGNOSIS — Z992 Dependence on renal dialysis: Secondary | ICD-10-CM | POA: Diagnosis not present

## 2021-04-02 DIAGNOSIS — D631 Anemia in chronic kidney disease: Secondary | ICD-10-CM | POA: Diagnosis not present

## 2021-04-04 ENCOUNTER — Other Ambulatory Visit (HOSPITAL_COMMUNITY): Payer: Medicare HMO

## 2021-04-06 ENCOUNTER — Ambulatory Visit (INDEPENDENT_AMBULATORY_CARE_PROVIDER_SITE_OTHER): Payer: Medicare HMO | Admitting: Student

## 2021-04-06 ENCOUNTER — Ambulatory Visit: Payer: Medicare HMO | Admitting: Student

## 2021-04-06 ENCOUNTER — Encounter: Payer: Self-pay | Admitting: Student

## 2021-04-06 ENCOUNTER — Other Ambulatory Visit: Payer: Self-pay

## 2021-04-06 VITALS — BP 112/58 | HR 87 | Ht 66.0 in | Wt 213.0 lb

## 2021-04-06 DIAGNOSIS — I10 Essential (primary) hypertension: Secondary | ICD-10-CM

## 2021-04-06 DIAGNOSIS — E785 Hyperlipidemia, unspecified: Secondary | ICD-10-CM

## 2021-04-06 DIAGNOSIS — Z992 Dependence on renal dialysis: Secondary | ICD-10-CM

## 2021-04-06 DIAGNOSIS — N186 End stage renal disease: Secondary | ICD-10-CM

## 2021-04-06 DIAGNOSIS — N2581 Secondary hyperparathyroidism of renal origin: Secondary | ICD-10-CM | POA: Diagnosis not present

## 2021-04-06 DIAGNOSIS — I739 Peripheral vascular disease, unspecified: Secondary | ICD-10-CM

## 2021-04-06 DIAGNOSIS — E039 Hypothyroidism, unspecified: Secondary | ICD-10-CM | POA: Diagnosis not present

## 2021-04-06 DIAGNOSIS — D689 Coagulation defect, unspecified: Secondary | ICD-10-CM | POA: Diagnosis not present

## 2021-04-06 DIAGNOSIS — E1122 Type 2 diabetes mellitus with diabetic chronic kidney disease: Secondary | ICD-10-CM | POA: Diagnosis not present

## 2021-04-06 DIAGNOSIS — I25118 Atherosclerotic heart disease of native coronary artery with other forms of angina pectoris: Secondary | ICD-10-CM | POA: Diagnosis not present

## 2021-04-06 NOTE — Progress Notes (Signed)
Cardiology Office Note    Date:  04/06/2021   ID:  Donna Howe, DOB 1956-06-02, MRN [id number]  PCP:  Curlene Labrum, MD  Cardiologist: Rozann Lesches, MD    Chief Complaint  Patient presents with   Hospitalization Follow-up    History of Present Illness:    JOHNANNA Howe is a 65 y.o. female with past medical history of CAD (s/p CABG in 2005 with multiple PCI's since including PTCA to SVG-OM1 in 01/2004, PTCA/DES to LM and prox LCx in 03/2004 and DES to SVG-OM1, PTCA/DES to SVG-OM in 01/2011, PTCA/DES to LM and LCx in 2015, cutting balloon angioplasty to prox Cx and DES to SVG-RCA in 05/2017, NSTEMI in 03/2018 with DES to mid-RCA and DES to LIMA-LAD, NSTEMI in 06/2020 and medical management recommended as she was felt to be high-risk for re-do CABG and poor target vessels, s/p NSTEMI in 12/2020 with DESx2 to SVG-RCA), HTN, HLD, Type 2 DM, carotid artery stenosis (s/p R CEA in 2014), left subclavian stenosis (s/p stent in 07/2020) and ESRD who presents to the office today for 28-monthfollow-up.   She was last examined by myself in 01/2021 following a recent hospitalization for an NSTEMI during which she received DES x2 to the [place] At the time of her visit, she reported occasional episodes of chest discomfort but felt like symptoms were due to acid reflux as they would resolve with Gas-X or consuming soda. It been discussed during her admission a repeat stress test could be considered if she had repeat anginal symptoms to assess her ischemic burden. She was continued on ASA, Plavix, Atorvastatin and Ranexa as she had previously been intolerant to beta-blocker therapy and Imdur due to fatigue and hypotension.  [facility] of [facility] and found to have an NSTEMI with peak troponin at 1277. She did have 75% stenosis along the SVG to PDA which was treated with scoring balloon angioplasty and also received DES placement to 80%  stenosis along the mid graft lesion. She did have severe in-stent restenosis of her subclavian stent and it was felt this could be causing anterior wall ischemia due to compromising flow into the LIMA. Vascular Surgery was also called as she was found to have severe in-stent restenosis of the subclavian stent which was treated with angioplasty and stenting by Dr. CCarlis Abbott She was discharged home on 02/25/2021 and it was recommended to continue DAPT indefinitely from a cardiac perspective.  In talking with the patient today, she reports having some episodes of chest pain following her hospitalization but symptoms have improved over the past 3 weeks. She reports her breathing has been stable and denies any specific orthopnea, PND or pitting edema.  She continues to have fatigue which is typically worse on dialysis days.   Past Medical History:  Diagnosis Date   Anemia    Anxiety    Asthma    CAD (coronary artery disease)    Multivessel s/p CABG 2005, numerous PCIs since that time and documented graft disease   [place]id artery disease ([place]    R CEA   [place]    Essential hypertension    Gout    History of blood transfusion    Hyperlipidemia    Hypothyroidism    Myocardial infarction ([facility]    PAD (peripheral artery disease) ([place]    Dr. LKellie Simmering  Pneumonia 09/2019, 11/2019   [facility]  and to LIMA to LAD with DES 04/09/18.   04/10/2018   SBO (small bowel obstruction) ([place]) 2011   Status post lysis of adhesions & hernia repair   Sinus bradycardia    Type 2 diabetes mellitus ([place])    Umbilical hernia     Past Surgical History:  Procedure Laterality Date   AORTIC ARCH ANGIOGRAPHY N/A 08/02/2020   Procedure: AORTIC ARCH ANGIOGRAPHY;  Surgeon: Waynetta Sandy, MD;  Location: [place] CV LAB;  Service: Cardiovascular;  Laterality: N/A;  Lt upper extermity   AV FISTULA PLACEMENT Left 06/29/2020   Procedure: LEFT ARM ARTERIOVENOUS (AV)  FISTULA;  Surgeon: Waynetta Sandy, MD;  Location: [facility] OR;  Service: Vascular;  Laterality: Left;  [place]  2010   CORONARY ARTERY BYPASS GRAFT  2005   CORONARY BALLOON ANGIOPLASTY N/A 05/31/2017   Procedure: CORONARY BALLOON ANGIOPLASTY;  Surgeon: Jettie Booze, MD;  Location: [place] CV LAB;  Service: Cardiovascular;  Laterality: N/A;   CORONARY STENT INTERVENTION N/A 05/31/2017   Procedure: CORONARY STENT INTERVENTION;  Surgeon: Jettie Booze, MD;  Location: [place] CV LAB;  Service: Cardiovascular;  Laterality: N/A;   CORONARY STENT INTERVENTION N/A 04/09/2018   Procedure: CORONARY STENT INTERVENTION;  Surgeon: Jettie Booze, MD;  Location: [place] [facility] LAB;  Service: Cardiovascular;  Laterality: N/A;  SVG RCA   CORONARY STENT INTERVENTION N/A 01/10/2021   Procedure: CORONARY STENT INTERVENTION;  Surgeon: Leonie Man, MD;  Location: [place] CV LAB;  Service: Cardiovascular;  Laterality: N/A;   CORONARY/GRAFT ACUTE MI REVASCULARIZATION N/A 02/24/2021   Procedure: Coronary/Graft Acute MI Revascularization;  Surgeon: Jettie Booze, MD;  Location: [place] CV LAB;  Service: Cardiovascular;  Laterality: N/A;   ENDARTERECTOMY Right 04/18/2013   Procedure: ENDARTERECTOMY CAROTID;  Surgeon: Mal Misty, MD;  Location: [place];  Service: Vascular;  Laterality: Right;   HERNIA REPAIR  1989   Incisional hernia repair x2  03/04/2010   Laparoscopic with 35cm mesh by Dr Ronnald Collum   IR [place] CV LINE RIGHT  06/21/2020   IR US GUIDE VASC ACCESS RIGHT  06/21/2020   LEFT HEART CATH AND CORS/GRAFTS ANGIOGRAPHY N/A 05/31/2017   Procedure: LEFT HEART CATH AND CORS/GRAFTS ANGIOGRAPHY;  Surgeon: Jettie Booze, MD;  Location: [place] [facility] LAB;  Service: Cardiovascular;  Laterality: N/A;   LEFT HEART CATH AND CORS/GRAFTS ANGIOGRAPHY N/A 04/08/2018   Procedure: LEFT HEART CATH AND CORS/GRAFTS ANGIOGRAPHY;   Surgeon: Jettie Booze, MD;  Location: [place] [facility] LAB;  Service: Cardiovascular;  Laterality: N/A;   LEFT HEART CATH AND CORS/GRAFTS ANGIOGRAPHY N/A 06/22/2020   Procedure: LEFT HEART CATH AND CORS/GRAFTS ANGIOGRAPHY;  Surgeon: Belva Crome, MD;  Location: [place] CV LAB;  Service: Cardiovascular;  Laterality: N/A;   LEFT HEART CATH AND CORS/GRAFTS ANGIOGRAPHY N/A 01/10/2021   Procedure: LEFT HEART CATH AND CORS/GRAFTS ANGIOGRAPHY;  Surgeon: Leonie Man, MD;  Location: [place] [facility] LAB;  Service: Cardiovascular;  Laterality: N/A;   LEFT HEART CATH AND CORS/GRAFTS ANGIOGRAPHY N/A 02/24/2021   Procedure: LEFT HEART CATH AND CORS/GRAFTS ANGIOGRAPHY;  Surgeon: Jettie Booze, MD;  Location: [place] CV LAB;  Service: Cardiovascular;  Laterality: N/A;   LEFT HEART CATHETERIZATION WITH CORONARY ANGIOGRAM N/A 12/19/2012   Procedure: LEFT HEART CATHETERIZATION WITH CORONARY ANGIOGRAM;  Surgeon: Josue Hector, MD;  Location[facility] C[facility] LAB;  Service: Cardiovascular;  Laterality: N/A;   LEFT  HEART CATHETERIZATION WITH CORONARY/GRAFT ANGIOGRAM N/A 04/19/2013   Procedure: LEFT HEART CATHETERIZATION WITH Beatrix Fetters;  Surgeon: Lorretta Harp, MD;  Location: [facility] CATH LAB;  Service: Cardiovascular;  Laterality: N/A;   LIGATION OF ARTERIOVENOUS  FISTULA Left 09/15/2020   Procedure: LIGATION OF LEFT ARM ARTERIOVENOUS  FISTULA;  Surgeon: Waynetta Sandy, MD;  Location: [place];  Service: Vascular;  Laterality: Left;   PATCH ANGIOPLASTY Right 04/18/2013   Procedure: PATCH ANGIOPLASTY Right Internal Carotid Artery;  Surgeon: Mal Misty, MD;  Location: [place];  Service: Vascular;  Laterality: Right;   PERCUTANEOUS CORONARY STENT INTERVENTION (PCI-S) Right 12/19/2012   Procedure: PERCUTANEOUS CORONARY STENT INTERVENTION (PCI-S);  Surgeon: Josue Hector, MD;  Location[facility] CATH LAB;  Service: Cardiovascular;  Laterality: Right;   PERIPHERAL VASCULAR INTERVENTION Left 08/02/2020    Procedure: PERIPHERAL VASCULAR INTERVENTION;  Surgeon: Waynetta Sandy, MD;  Location: [place] CV LAB;  Service: Cardiovascular;  Laterality: Left;  Left subclavian   PERIPHERAL VASCULAR INTERVENTION  02/24/2021   Procedure: PERIPHERAL VASCULAR INTERVENTION;  Surgeon: Marty Heck, MD;  Location: [place] CV LAB;  Service: Vascular;;   SHOULDER SURGERY      Current Medications: Outpatient Medications Prior to Visit  Medication Sig Dispense Refill   Albuterol Sulfate 108 (90 Base) MCG/ACT AEPB Inhale 2 puffs into the lungs every 4 (four) hours as needed (Shortness of breath).      allopurinol (ZYLOPRIM) 100 MG tablet Take one tablet after hemodialysis, on Tuesday, Thursday and Saturday. (Patient taking differently: Take 100 mg by mouth Every Tuesday,Thursday,and Saturday with dialysis. After dialysis) 30 tablet 11   ALPRAZolam (XANAX) 0.5 MG tablet Take 0.25-0.5 mg by mouth See admin instructions. Take 0.25 in the morning and evening and 0.5 mg at bedtime     aspirin EC 81 MG tablet Take 81 mg by mouth daily.      atorvastatin (LIPITOR) 80 MG tablet Take 1 tablet (80 mg total) by mouth every evening. (Patient taking differently: Take 80 mg by mouth at bedtime.)     brimonidine (ALPHAGAN) 0.2 % ophthalmic solution Place 1 drop into the left eye 3 (three) times daily.     clopidogrel (PLAVIX) 75 MG tablet Take 1 tablet (75 mg total) by mouth daily. 90 tablet 3   ezetimibe (ZETIA) 10 MG tablet Take 1 tablet (10 mg total) by mouth daily. 30 tablet 11   ferric citrate (AURYXIA) 1 GM 210 MG(Fe) tablet Take 420 mg by mouth 3 (three) times daily with meals.     gabapentin (NEURONTIN) 400 MG capsule Take 1 capsule (400 mg total) by mouth 2 (two) times daily.     ipratropium (ATROVENT HFA) 17 MCG/ACT inhaler Inhale 2 puffs into the lungs every 4 (four) hours as needed for wheezing.      levothyroxine (SYNTHROID) 112 MCG tablet Take 112 mcg by mouth daily before breakfast.      nitroGLYCERIN (NITROSTAT) 0.4 MG SL tablet Place 1 tablet (0.4 mg total) under the tongue every 5 (five) minutes x 3 doses as needed for chest pain. 100 tablet 3   NOVOLIN 70/30 FLEXPEN (70-30) 100 UNIT/ML KwikPen Inject 24 Units into the skin in the morning and at bedtime.     ondansetron (ZOFRAN) 8 MG tablet Take 8 mg by mouth every 8 (eight) hours as needed for nausea.     pantoprazole (PROTONIX) 40 MG tablet Take 1 tablet (40 mg total) by mouth 2 (two) times daily. 60 tablet 1  ranolazine (RANEXA) 500 MG 12 hr tablet Take 1 tablet (500 mg total) by mouth 2 (two) times daily. (Patient taking differently: Take 500 mg by mouth at bedtime.) 60 tablet 1   Vitamin D, Ergocalciferol, (DRISDOL) 1.25 MG (50000 UNIT) CAPS capsule Take 50,000 Units by mouth every Sunday.      No facility-administered medications prior to visit.     Allergies:   Penicillins and Beta adrenergic blockers   Social History   Socioeconomic History   Marital status: Divorced    Spouse name: Not on file   Number of children: Not on file   Years of education: Not on file   Highest education level: Not on file  Occupational History   Occupation: Disabled  Tobacco Use   Smoking status: Former    Packs/day: 1.00    Years: 20.00    Pack years: 20.00    Types: Cigarettes    Quit date: 12/10/2012    Years since quitting: 8.3   Smokeless tobacco: Never  Vaping Use   Vaping Use: Never used  Substance and Sexual Activity   Alcohol use: No    Alcohol/week: 0.0 standard drinks   Drug use: No   Sexual activity: Not Currently  Other Topics Concern   Not on file  Social History Narrative   Lives with mother.   Social Determinants of Health   Financial Resource Strain: Not on file  Food Insecurity: Not on file  Transportation Needs: Not on file  Physical Activity: Not on file  Stress: Not on file  Social Connections: Not on file     Family History:  The patient's family history includes AAA (abdominal aortic  aneurysm) in her father; Diabetes in her mother; Heart disease in her brother and mother; Hyperlipidemia in her mother and son; Hypertension in her brother, father, mother, and son; Thyroid disease in her father.   Review of Systems:    Please see the history of present illness.     All other systems reviewed and are otherwise negative except as noted above.   Physical Exam:    VS:  BP (!) 112/58   Pulse 87   Ht 5' 6"  (1.676 m)   Wt 213 lb (96.6 kg)   SpO2 98%   BMI 34.38 kg/m    General: Well developed, well nourished,female appearing in no acute distress. Head: Normocephalic, atraumatic. Neck: No carotid bruits. JVD not elevated.  Lungs: Respirations regular and unlabored, without wheezes or rales.  Heart: Regular rate and rhythm. No S3 or S4.  No murmur, no rubs, or gallops appreciated. Abdomen: Appears non-distended. No obvious abdominal masses. Msk:  Strength and tone appear normal for age. No obvious joint deformities or effusions. Extremities: No clubbing or cyanosis. No pitting edema.  Distal pedal pulses are 2+ bilaterally. Neuro: Alert and oriented X 3. Moves all extremities spontaneously. No focal deficits noted. Psych:  Responds to questions appropriately with a normal affect. Skin: No rashes or lesions noted  Wt Readings from Last 3 Encounters:  04/06/21 213 lb (96.6 kg)  02/25/21 (P) 220 lb 7.4 oz (100 kg)  02/04/21 204 lb (92.5 kg)      Studies/Labs Reviewed:   EKG:  EKG is ordered today. The ekg ordered today demonstrates NSR, HR 83 with PVC's and IVCD with TWI along Leads I and AVL.   Recent Labs: 06/18/2020: B Natriuretic Peptide 2,129.0; TSH 1.057 01/10/2021: ALT 14; Magnesium 1.9 02/25/2021: BUN 14; Creatinine, Ser 2.68; Hemoglobin 9.7; Platelets 161; Potassium 3.7; Sodium  132   Lipid Panel    Component Value Date/Time   CHOL 168 02/23/2021 0446   TRIG 257 (H) 02/23/2021 0446   HDL 41 02/23/2021 0446   CHOLHDL 4.1 02/23/2021 0446   VLDL 51 (H)  02/23/2021 0446   LDLCALC 76 02/23/2021 0446    Additional studies/ records that were reviewed today include:   Cardiac Catheterization: 02/24/2021 Ost LM to Dist LM lesion is 95% stenosed. LIMA to LAD is patent. Prox LAD to Mid LAD lesion is 100% stenosed. Known from prior catheterization. Unable to advance left catheter through the severely diseased subclavian stent. Ost LAD to Prox LAD lesion is 100% stenosed. 1st Mrg-1 lesion is 99% stenosed. SVG to OM occluded. 1st Mrg-2 lesion is 100% stenosed. Ost Cx to Mid Cx lesion is 99% stenosed. Origin to Prox Graft lesion before 1st Mrg is 100% stenosed. LIMA and is normal in caliber. Ostial LIMA stent is 60% narrowed. Prox RCA lesion is 100% stenosed. SVG to PDA showed prox Graft lesion is 75% stenosed. Scoring balloon angioplasty was performed using a BALLOON SCOREFLEX 3.0X10. Multipurpose guide catheter was able to track through the subclavian stent. Post intervention, there is a 0% residual stenosis. SVG to PDA showed mid Graft lesion is 80% stenosed. A drug-eluting stent was successfully placed using a SYNERGY XD 3.50X16. Post intervention, there is a 0% residual stenosis. LV end diastolic pressure is moderately elevated. There is no aortic valve stenosis. Origin lesion is 20% stenosed.   Due to the severe in-stent restenosis of the subclavian stent, Dr. Carlis Abbott was called.  I suspect this was causing anterior wall ischemia due to compromising flow into the LIMA.  There was a 30 mm gradient across the stent.  There was clear difficulty getting equipment across the stent to the heart as noted in the report.  He is planning to place a covered stent over the disease to bare-metal stent which was previously placed.   From a cardiac standpoint, continue aggressive secondary prevention.  She will need dual antiplatelet therapy.   Peripheral Vascular Intervention: 02/24/2021 Procedure: I was called to lab 9 to evaluate her left subclavian stent  and brachial access had been previously obtained by Dr. Irish Lack.  In evaluating the images, it did appear that there was a high-grade greater than 70 to 80% in-stent stenosis in the left subclavian stent and this had a pressure gradient of greater than 30 mmHg as measured by cardiology.  Once Dr. Irish Lack was finished with his portion of the procedure, I then used a versa core wire to exchange for a long 7 [place] destination sheath in the left brachial artery.  I advanced this all the way into the axillary artery.  Initially I planned to realign her existing subclavian stent which was bare-metal with a covered stent.  It became apparent that this was a very tight lesion and I could not even get an 035 catheter to track across the wire in the left subclavian stent and met resistance.  Ultimately initially tried to also cross a 4 mm Mustang balloon unsuccessful.  I then downsized to a V 18 wire and a smaller system and finally was able to get a 4 mm Sterling to cross and predilated the left subclavian in-stent stenosis with a 4 mm Sterling to nominal pressure for 2 minutes.  I then used a 5 mm Sterling to nominal pressure for 2 minutes.  That point time I tried to place a 7 mm VBX inside the existing stent but  this would not track even after pre-dilation.  I then used a KMP catheter to cross the stent and exchanged for a stiffer Rosen wire down in the descending thoracic aorta.  Additional 3000 units of IV heparin was given we did check an ACT to maintain greater than 250.  At this point in time I advanced the sheath as far as I could into the left subclavian artery but it would not track across the left subclavian stent.  I then elected to finally predilate th left subclavian stent with a 6 mm x 20 mm Mustang over the B and E system.  Finally I was able to get the VBX to track after dilation with a 6 mm baloon and I stented her proximal left subclavian making sure the stent was a little more distal given there appeared  to be some calcified disease just distal to her previous stent.  This was deployed to nominal pressure inflating it to about 6.5 mm given her previous stent was 6 mm.  Final injection showed filling of the LIMA graft with no evidence of residual stenosis.  I did put a KMP catheter across the stent in her aortic arch and did a pullback pressure and there was no residual gradient at completion in the left subclavian stent.  Wires and catheters were removed.  She was taken to holding to have the sheath removed.    Assessment:    1. Coronary artery disease of native artery of native heart with stable angina pectoris ([place])   2. Essential hypertension   3. Hyperlipidemia LDL goal <70   4. PVD (peripheral vascular disease) ([place])   5. ESRD on dialysis [facility])      Plan:   In order of problems listed above:  1. CAD - She is s/p CABG in 2005 with multiple PCI's since with the most recent being an NSTEMI in 12/2020 with DESx2 to SVG-RCA and recent NSTEMI in 02/2021 and found to have 75% stenosis along the SVG to PDA which was treated with scoring balloon angioplasty and also received DES placement to 80% stenosis along the mid graft lesion. Also required intervention to her subclavian stent as it was felt this could be compromising flow into the LIMA.   - She did have some episodes of chest discomfort upon returning home but denies any recurrence since.   - Continue current medication regimen with ASA 81 mg daily, Plavix 75 mg daily, Atorvastatin 80 mg daily, Zetia 10 mg daily and Ranexa 500 mg daily (has been unable to tolerate twice daily dosing due to dizziness). She has not been on a beta-blocker or Imdur due to fatigue and hypotension with these in the past.    2. HTN - She has actually experienced episodes of hypotension since being on hemodialysis. BP is well controlled at 112/58 during today's visit and she is currently not on medications for blood pressure control.  3. HLD - FLP last month  showed total cholesterol 168, triglycerides 257, HDL 51 and LDL 76.  She was continued on Atorvastatin 80 mg daily with Zetia 10 mg daily being added to her medication regimen. She does have scheduled follow-up labs with her PCP in 04/2021. If LDL remains above goal, would recommend referral to the [facility] for consideration of PCSK9 inhibitor therapy.   4. Left Subclavian Stenosis - She is s/p stenting in 07/2020 and was recently found to have severe in-stent restenosis of the subclavian stent which was treated with angioplasty and stenting by Dr. Carlis Abbott  on 02/24/2021. She does have scheduled follow-up with Vascular Surgery later this week.   5. ESRD - On HD. She is scheduled for right AV fistula creation later this month and we reviewed today that ASA and Plavix cannot be held for the procedure but she reports being informed that she would be able to continue the medications prior to surgery. Will forward today's note to Vascular. Would not anticipate additional cardiac testing prior to surgery given recent stent placement.   Medication Adjustments/Labs and Tests Ordered: Current medicines are reviewed at length with the patient today.  Concerns regarding medicines are outlined above.  Medication changes, Labs and Tests ordered today are listed in the Patient Instructions below. Patient Instructions  Medication Instructions:   Continue current medication regimen.   Labwork:  No labs at the time. Continued with planned labs by your PCP in 04/2021.   Testing/Procedures:  None.   Follow-Up:  With Bernerd Pho, PA-C or Dr. Domenic Polite in 3 months.   Any Other Special Instructions Will Be Listed Below (If Applicable).     If you need a refill on your cardiac medications before your next appointment, please call your pharmacy.   Signed, Erma Heritage, PA-C  04/06/2021 5:18 PM    [facility] [address] [place], [place] [address] Phone: [phone] Fax: [phone]

## 2021-04-06 NOTE — Patient Instructions (Signed)
Medication Instructions:   Continue current medication regimen.   Labwork:  No labs at the time. Continued with planned labs by your PCP in 04/2021.   Testing/Procedures:  None.   Follow-Up:  With Bernerd Pho, PA-C or Dr. Domenic Polite in 3 months.   Any Other Special Instructions Will Be Listed Below (If Applicable).     If you need a refill on your cardiac medications before your next appointment, please call your pharmacy.

## 2021-04-07 DIAGNOSIS — D689 Coagulation defect, unspecified: Secondary | ICD-10-CM | POA: Diagnosis not present

## 2021-04-07 DIAGNOSIS — E039 Hypothyroidism, unspecified: Secondary | ICD-10-CM | POA: Diagnosis not present

## 2021-04-07 DIAGNOSIS — N2581 Secondary hyperparathyroidism of renal origin: Secondary | ICD-10-CM | POA: Diagnosis not present

## 2021-04-07 DIAGNOSIS — Z992 Dependence on renal dialysis: Secondary | ICD-10-CM | POA: Diagnosis not present

## 2021-04-07 DIAGNOSIS — N186 End stage renal disease: Secondary | ICD-10-CM | POA: Diagnosis not present

## 2021-04-07 DIAGNOSIS — E1122 Type 2 diabetes mellitus with diabetic chronic kidney disease: Secondary | ICD-10-CM | POA: Diagnosis not present

## 2021-04-08 ENCOUNTER — Ambulatory Visit (INDEPENDENT_AMBULATORY_CARE_PROVIDER_SITE_OTHER): Payer: Medicare HMO | Admitting: Physician Assistant

## 2021-04-08 ENCOUNTER — Other Ambulatory Visit: Payer: Self-pay

## 2021-04-08 ENCOUNTER — Ambulatory Visit (HOSPITAL_COMMUNITY)
Admission: RE | Admit: 2021-04-08 | Discharge: 2021-04-08 | Disposition: A | Discharge status: Home / Self Care | Payer: Medicare HMO | Source: Ambulatory Visit | Attending: Vascular Surgery | Admitting: Vascular Surgery

## 2021-04-08 ENCOUNTER — Other Ambulatory Visit (HOSPITAL_COMMUNITY): Payer: Medicare HMO

## 2021-04-08 VITALS — BP 108/62 | HR 67 | Temp 97.2°F | Resp 20 | Ht 66.0 in | Wt 209.2 lb

## 2021-04-08 DIAGNOSIS — I771 Stricture of artery: Secondary | ICD-10-CM

## 2021-04-08 DIAGNOSIS — Z992 Dependence on renal dialysis: Secondary | ICD-10-CM

## 2021-04-08 DIAGNOSIS — N186 End stage renal disease: Secondary | ICD-10-CM

## 2021-04-08 DIAGNOSIS — I6523 Occlusion and stenosis of bilateral carotid arteries: Secondary | ICD-10-CM

## 2021-04-08 NOTE — Progress Notes (Signed)
Office Note     CC:  follow up Requesting Provider:  Curlene Labrum, MD  HPI: Donna Howe is a 65 y.o. (17-Apr-1956) female who presents status post restenting of left subclavian artery by Dr. Carlis Abbott on 02/24/2021.  She was undergoing a left heart catheterization with Dr. Irish Lack when Dr. Carlis Abbott was consulted intraoperatively due to subclavian in-stent stenosis threatening inflow to a LIMA graft.  Patient continues to have left arm claudication.  She does have dizzy spells however these always occur after hemodialysis treatments.  She is also scheduled for new dialysis access placement in her right arm on 04/13/2021 with Dr. Donzetta Matters.  She is dialyzing via right IJ [facility] on a Tuesday Thursday Saturday schedule.  She is on aspirin, Plavix, statin daily.  She is a former smoker.   Past Medical History:  Diagnosis Date   Anemia    Anxiety    Asthma    CAD (coronary artery disease)    Multivessel s/p CABG 2005, numerous PCIs since that time and documented graft disease   Carotid artery disease ([place])    R CEA   ESRD on hemodialysis ([place])    Essential hypertension    Gout    History of blood transfusion    Hyperlipidemia    Hypothyroidism    Myocardial infarction [facility])    PAD (peripheral artery disease) ([place])    Dr. Kellie Simmering   Pneumonia 09/2019, 11/2019   S/P angioplasty with stent- DES to mRCA and to LIMA to LAD with DES 04/09/18.   04/10/2018   SBO (small bowel obstruction) ([place]) 2011   Status post lysis of adhesions & hernia repair   Sinus bradycardia    Type 2 diabetes mellitus ([place])    Umbilical hernia     Past Surgical History:  Procedure Laterality Date   AORTIC ARCH ANGIOGRAPHY N/A 08/02/2020   Procedure: AORTIC ARCH ANGIOGRAPHY;  Surgeon: Waynetta Sandy, MD;  Location: [place] CV LAB;  Service: Cardiovascular;  Laterality: N/A;  Lt upper extermity   AV FISTULA PLACEMENT Left 06/29/2020   Procedure: LEFT ARM ARTERIOVENOUS (AV) FISTULA;  Surgeon: Waynetta Sandy, MD;  Location: [facility] OR;  Service: Vascular;  Laterality: Left;  [place]  2010   CORONARY ARTERY BYPASS GRAFT  2005   CORONARY BALLOON ANGIOPLASTY N/A 05/31/2017   Procedure: CORONARY BALLOON ANGIOPLASTY;  Surgeon: Jettie Booze, MD;  Location: [place] CV LAB;  Service: Cardiovascular;  Laterality: N/A;   CORONARY STENT INTERVENTION N/A 05/31/2017   Procedure: CORONARY STENT INTERVENTION;  Surgeon: Jettie Booze, MD;  Location: [place] CV LAB;  Service: Cardiovascular;  Laterality: N/A;   CORONARY STENT INTERVENTION N/A 04/09/2018   Procedure: CORONARY STENT INTERVENTION;  Surgeon: Jettie Booze, MD;  Location: [place] CV LAB;  Service: Cardiovascular;  Laterality: N/A;  SVG RCA   CORONARY STENT INTERVENTION N/A 01/10/2021   Procedure: CORONARY STENT INTERVENTION;  Surgeon: Leonie Man, MD;  Location: [place] CV LAB;  Service: Cardiovascular;  Laterality: N/A;   CORONARY/GRAFT ACUTE MI REVASCULARIZATION N/A 02/24/2021   Procedure: Coronary/Graft Acute MI Revascularization;  Surgeon: Jettie Booze, MD;  Location: [place] CV LAB;  Service: Cardiovascular;  Laterality: N/A;   ENDARTERECTOMY Right 04/18/2013   Procedure: ENDARTERECTOMY CAROTID;  Surgeon: Mal Misty, MD;  Location: [place];  Service: Vascular;  Laterality: Right;   HERNIA REPAIR  1989   Incisional hernia repair x2  03/04/2010  Laparoscopic with 35cm mesh by Dr Ronnald Collum   IR [place] CV LINE RIGHT  06/21/2020   IR US GUIDE VASC ACCESS RIGHT  06/21/2020   LEFT HEART CATH AND CORS/GRAFTS ANGIOGRAPHY N/A 05/31/2017   Procedure: LEFT HEART CATH AND CORS/GRAFTS ANGIOGRAPHY;  Surgeon: Jettie Booze, MD;  Location: [place] CV LAB;  Service: Cardiovascular;  Laterality: N/A;   LEFT HEART CATH AND CORS/GRAFTS ANGIOGRAPHY N/A 04/08/2018   Procedure: LEFT HEART CATH AND CORS/GRAFTS ANGIOGRAPHY;  Surgeon: Jettie Booze, MD;   Location: [place] CV LAB;  Service: Cardiovascular;  Laterality: N/A;   LEFT HEART CATH AND CORS/GRAFTS ANGIOGRAPHY N/A 06/22/2020   Procedure: LEFT HEART CATH AND CORS/GRAFTS ANGIOGRAPHY;  Surgeon: Belva Crome, MD;  Location: [place] CV LAB;  Service: Cardiovascular;  Laterality: N/A;   LEFT HEART CATH AND CORS/GRAFTS ANGIOGRAPHY N/A 01/10/2021   Procedure: LEFT HEART CATH AND CORS/GRAFTS ANGIOGRAPHY;  Surgeon: Leonie Man, MD;  Location: [place] CV LAB;  Service: Cardiovascular;  Laterality: N/A;   LEFT HEART CATH AND CORS/GRAFTS ANGIOGRAPHY N/A 02/24/2021   Procedure: LEFT HEART CATH AND CORS/GRAFTS ANGIOGRAPHY;  Surgeon: Jettie Booze, MD;  Location: [place] CV LAB;  Service: Cardiovascular;  Laterality: N/A;   LEFT HEART CATHETERIZATION WITH CORONARY ANGIOGRAM N/A 12/19/2012   Procedure: LEFT HEART CATHETERIZATION WITH CORONARY ANGIOGRAM;  Surgeon: Josue Hector, MD;  Location: [facility] CATH LAB;  Service: Cardiovascular;  Laterality: N/A;   LEFT HEART CATHETERIZATION WITH CORONARY/GRAFT ANGIOGRAM N/A 04/19/2013   Procedure: LEFT HEART CATHETERIZATION WITH Beatrix Fetters;  Surgeon: Lorretta Harp, MD;  Location: [facility] CATH LAB;  Service: Cardiovascular;  Laterality: N/A;   LIGATION OF ARTERIOVENOUS  FISTULA Left 09/15/2020   Procedure: LIGATION OF LEFT ARM ARTERIOVENOUS  FISTULA;  Surgeon: Waynetta Sandy, MD;  Location: [place];  Service: Vascular;  Laterality: Left;   PATCH ANGIOPLASTY Right 04/18/2013   Procedure: PATCH ANGIOPLASTY Right Internal Carotid Artery;  Surgeon: Mal Misty, MD;  Location: [place];  Service: Vascular;  Laterality: Right;   PERCUTANEOUS CORONARY STENT INTERVENTION (PCI-S) Right 12/19/2012   Procedure: PERCUTANEOUS CORONARY STENT INTERVENTION (PCI-S);  Surgeon: Josue Hector, MD;  Location: [facility] CATH LAB;  Service: Cardiovascular;  Laterality: Right;   PERIPHERAL VASCULAR INTERVENTION Left 08/02/2020   Procedure: PERIPHERAL VASCULAR  INTERVENTION;  Surgeon: Waynetta Sandy, MD;  Location: [place] CV LAB;  Service: Cardiovascular;  Laterality: Left;  Left subclavian   PERIPHERAL VASCULAR INTERVENTION  02/24/2021   Procedure: PERIPHERAL VASCULAR INTERVENTION;  Surgeon: Marty Heck, MD;  Location: [place] CV LAB;  Service: Vascular;;   SHOULDER SURGERY      Social History   Socioeconomic History   Marital status: Divorced    Spouse name: Not on file   Number of children: Not on file   Years of education: Not on file   Highest education level: Not on file  Occupational History   Occupation: Disabled  Tobacco Use   Smoking status: Former    Packs/day: 1.00    Years: 20.00    Pack years: 20.00    Types: Cigarettes    Quit date: 12/10/2012    Years since quitting: 8.3   Smokeless tobacco: Never  Vaping Use   Vaping Use: Never used  Substance and Sexual Activity   Alcohol use: No    Alcohol/week: 0.0 standard drinks   Drug use: No   Sexual activity: Not Currently  Other Topics Concern   Not on  file  Social History Narrative   Lives with mother.   Social Determinants of Radio broadcast assistant Strain: Not on file  Food Insecurity: Not on file  Transportation Needs: Not on file  Physical Activity: Not on file  Stress: Not on file  Social Connections: Not on file  Intimate Partner Violence: Not on file    Family History  Problem Relation Age of Onset   Diabetes Mother    Heart disease Mother        before age 62   Hyperlipidemia Mother    Hypertension Mother    Thyroid disease Father    Hypertension Father    AAA (abdominal aortic aneurysm) Father    Heart disease Brother        before age 18   Hypertension Brother    Hyperlipidemia Son    Hypertension Son     Current Outpatient Medications  Medication Sig Dispense Refill   Albuterol Sulfate 108 (90 Base) MCG/ACT AEPB Inhale 2 puffs into the lungs every 6 (six) hours as needed (Shortness of breath).      allopurinol (ZYLOPRIM) 100 MG tablet Take one tablet after hemodialysis, on Tuesday, Thursday and Saturday. (Patient taking differently: Take 100 mg by mouth Every Tuesday,Thursday,and Saturday with dialysis. After dialysis) 30 tablet 11   ALPRAZolam (XANAX) 0.5 MG tablet Take 0.25-0.5 mg by mouth See admin instructions. Take 0.25 in the morning and evening and 0.5 mg at bedtime     aspirin EC 81 MG tablet Take 81 mg by mouth daily.      atorvastatin (LIPITOR) 80 MG tablet Take 1 tablet (80 mg total) by mouth every evening. (Patient taking differently: Take 80 mg by mouth at bedtime.)     clopidogrel (PLAVIX) 75 MG tablet Take 1 tablet (75 mg total) by mouth daily. 90 tablet 3   ezetimibe (ZETIA) 10 MG tablet Take 1 tablet (10 mg total) by mouth daily. (Patient taking differently: Take 10 mg by mouth at bedtime.) 30 tablet 11   ferric citrate (AURYXIA) 1 GM 210 MG(Fe) tablet Take 420 mg by mouth 3 (three) times daily with meals.     gabapentin (NEURONTIN) 400 MG capsule Take 1 capsule (400 mg total) by mouth 2 (two) times daily.     ipratropium (ATROVENT HFA) 17 MCG/ACT inhaler Inhale 2 puffs into the lungs every 4 (four) hours as needed for wheezing.      ketorolac (ACULAR) 0.4 % SOLN Place 1 drop into the right eye 4 (four) times daily.     levothyroxine (SYNTHROID) 112 MCG tablet Take 112 mcg by mouth daily before breakfast.     multivitamin (RENA-VIT) TABS tablet Take 1 tablet by mouth daily.     nitroGLYCERIN (NITROSTAT) 0.4 MG SL tablet Place 1 tablet (0.4 mg total) under the tongue every 5 (five) minutes x 3 doses as needed for chest pain. 100 tablet 3   NOVOLIN 70/30 FLEXPEN (70-30) 100 UNIT/ML KwikPen Inject 24 Units into the skin in the morning and at bedtime.     ondansetron (ZOFRAN) 8 MG tablet Take 8 mg by mouth every 8 (eight) hours as needed for nausea.     pantoprazole (PROTONIX) 40 MG tablet Take 1 tablet (40 mg total) by mouth 2 (two) times daily. 60 tablet 1   prednisoLONE acetate  (PRED FORTE) 1 % ophthalmic suspension Place 1 drop into the right eye daily.     ranolazine (RANEXA) 500 MG 12 hr tablet Take 1 tablet (500 mg total)  by mouth 2 (two) times daily. 60 tablet 1   Vitamin D, Ergocalciferol, (DRISDOL) 1.25 MG (50000 UNIT) CAPS capsule Take 50,000 Units by mouth every Sunday.      No current facility-administered medications for this visit.    Allergies  Allergen Reactions   Penicillins Other (See Comments)    REACTION: Unknown, told as a child Has patient had a PCN reaction causing immediate rash, facial/tongue/throat swelling, SOB or lightheadedness with hypotension: Unknown Has patient had a PCN reaction causing severe rash involving mucus membranes or skin necrosis: Unknown Has patient had a PCN reaction that required hospitalization: Unknown Has patient had a PCN reaction occurring within the last 10 years: No If all of the above answers are "NO", then may proceed with Cephalosporin use.    Beta Adrenergic Blockers Other (See Comments)    Hypotension     REVIEW OF SYSTEMS:   [X]  denotes positive finding, [ ]  denotes negative finding Cardiac  Comments:  Chest pain or chest pressure:    Shortness of breath upon exertion:    Short of breath when lying flat:    Irregular heart rhythm:        Vascular    Pain in calf, thigh, or hip brought on by ambulation:    Pain in feet at night that wakes you up from your sleep:     Blood clot in your veins:    Leg swelling:         Pulmonary    Oxygen at home:    Productive cough:     Wheezing:         Neurologic    Sudden weakness in arms or legs:     Sudden numbness in arms or legs:     Sudden onset of difficulty speaking or slurred speech:    Temporary loss of vision in one eye:     Problems with dizziness:         Gastrointestinal    Blood in stool:     Vomited blood:         Genitourinary    Burning when urinating:     Blood in urine:        Psychiatric    Major depression:          Hematologic    Bleeding problems:    Problems with blood clotting too easily:        Skin    Rashes or ulcers:        Constitutional    Fever or chills:      PHYSICAL EXAMINATION:  Vitals:   04/08/21 1329  BP: 108/62  Pulse: 67  Resp: 20  Temp: (!) 97.2 F (36.2 C)  TempSrc: Temporal  SpO2: 95%  Weight: 209 lb 3.2 oz (94.9 kg)  Height: 5\' 6"  (1.676 m)    General:  WDWN in NAD; vital signs documented above Gait: Not observed HENT: WNL, normocephalic Pulmonary: normal non-labored breathing Cardiac: regular HR Abdomen: soft, NT, no masses Skin: without rashes Vascular Exam/Pulses:  Right Left  Radial 2+ (normal) absent  Ulnar absent absent   Extremities: without ischemic changes, without Gangrene , without cellulitis; without open wounds;  Musculoskeletal: no muscle wasting or atrophy  Neurologic: A&O X 3 Psychiatric:  The pt has Normal affect.   Non-Invasive Vascular Imaging:   Left subclavian stent not visualized however there is a 411 cm/s velocity in subclavian artery with monophasic flow throughout the left arm on duplex    ASSESSMENT/PLAN:: 64  y.o. female here for follow up for restenting of left subclavian artery due to threatened LIMA graft  -Left subclavian stent was unable to be visualized on ultrasound today however there is a 411 cm/s velocity in the subclavian artery with monophasic flow beyond this area -This case was discussed with Dr. Donzetta Matters who will proceed with new dialysis access placement and right arm on 04/13/2021 -Plan will be to check a CTA to further evaluate.  We will arrange a CTA at [facility] and follow-up to go over results with Dr. Rolanda Lundborg, PA-C [facility] [phone]  Clinic MD:   Donzetta Matters

## 2021-04-09 DIAGNOSIS — Z992 Dependence on renal dialysis: Secondary | ICD-10-CM | POA: Diagnosis not present

## 2021-04-09 DIAGNOSIS — E1122 Type 2 diabetes mellitus with diabetic chronic kidney disease: Secondary | ICD-10-CM | POA: Diagnosis not present

## 2021-04-09 DIAGNOSIS — N186 End stage renal disease: Secondary | ICD-10-CM | POA: Diagnosis not present

## 2021-04-09 DIAGNOSIS — E039 Hypothyroidism, unspecified: Secondary | ICD-10-CM | POA: Diagnosis not present

## 2021-04-09 DIAGNOSIS — N2581 Secondary hyperparathyroidism of renal origin: Secondary | ICD-10-CM | POA: Diagnosis not present

## 2021-04-09 DIAGNOSIS — D689 Coagulation defect, unspecified: Secondary | ICD-10-CM | POA: Diagnosis not present

## 2021-04-12 DIAGNOSIS — N186 End stage renal disease: Secondary | ICD-10-CM | POA: Diagnosis not present

## 2021-04-12 DIAGNOSIS — D689 Coagulation defect, unspecified: Secondary | ICD-10-CM | POA: Diagnosis not present

## 2021-04-12 DIAGNOSIS — D509 Iron deficiency anemia, unspecified: Secondary | ICD-10-CM | POA: Diagnosis not present

## 2021-04-12 DIAGNOSIS — N2581 Secondary hyperparathyroidism of renal origin: Secondary | ICD-10-CM | POA: Diagnosis not present

## 2021-04-12 DIAGNOSIS — E8779 Other fluid overload: Secondary | ICD-10-CM | POA: Diagnosis not present

## 2021-04-12 DIAGNOSIS — Z992 Dependence on renal dialysis: Secondary | ICD-10-CM | POA: Diagnosis not present

## 2021-04-14 DIAGNOSIS — N2581 Secondary hyperparathyroidism of renal origin: Secondary | ICD-10-CM | POA: Diagnosis not present

## 2021-04-14 DIAGNOSIS — D689 Coagulation defect, unspecified: Secondary | ICD-10-CM | POA: Diagnosis not present

## 2021-04-14 DIAGNOSIS — N186 End stage renal disease: Secondary | ICD-10-CM | POA: Diagnosis not present

## 2021-04-14 DIAGNOSIS — Z992 Dependence on renal dialysis: Secondary | ICD-10-CM | POA: Diagnosis not present

## 2021-04-14 DIAGNOSIS — E8779 Other fluid overload: Secondary | ICD-10-CM | POA: Diagnosis not present

## 2021-04-14 DIAGNOSIS — D509 Iron deficiency anemia, unspecified: Secondary | ICD-10-CM | POA: Diagnosis not present

## 2021-04-15 DIAGNOSIS — Z992 Dependence on renal dialysis: Secondary | ICD-10-CM | POA: Diagnosis not present

## 2021-04-15 DIAGNOSIS — D509 Iron deficiency anemia, unspecified: Secondary | ICD-10-CM | POA: Diagnosis not present

## 2021-04-15 DIAGNOSIS — E8779 Other fluid overload: Secondary | ICD-10-CM | POA: Diagnosis not present

## 2021-04-15 DIAGNOSIS — N186 End stage renal disease: Secondary | ICD-10-CM | POA: Diagnosis not present

## 2021-04-15 DIAGNOSIS — N2581 Secondary hyperparathyroidism of renal origin: Secondary | ICD-10-CM | POA: Diagnosis not present

## 2021-04-15 DIAGNOSIS — D689 Coagulation defect, unspecified: Secondary | ICD-10-CM | POA: Diagnosis not present

## 2021-04-16 DIAGNOSIS — E8779 Other fluid overload: Secondary | ICD-10-CM | POA: Diagnosis not present

## 2021-04-16 DIAGNOSIS — D509 Iron deficiency anemia, unspecified: Secondary | ICD-10-CM | POA: Diagnosis not present

## 2021-04-16 DIAGNOSIS — N2581 Secondary hyperparathyroidism of renal origin: Secondary | ICD-10-CM | POA: Diagnosis not present

## 2021-04-16 DIAGNOSIS — D689 Coagulation defect, unspecified: Secondary | ICD-10-CM | POA: Diagnosis not present

## 2021-04-16 DIAGNOSIS — N186 End stage renal disease: Secondary | ICD-10-CM | POA: Diagnosis not present

## 2021-04-16 DIAGNOSIS — Z992 Dependence on renal dialysis: Secondary | ICD-10-CM | POA: Diagnosis not present

## 2021-04-17 DIAGNOSIS — N186 End stage renal disease: Secondary | ICD-10-CM | POA: Diagnosis not present

## 2021-04-17 DIAGNOSIS — I10 Essential (primary) hypertension: Secondary | ICD-10-CM | POA: Diagnosis not present

## 2021-04-17 DIAGNOSIS — I70201 Unspecified atherosclerosis of native arteries of extremities, right leg: Secondary | ICD-10-CM | POA: Diagnosis not present

## 2021-04-17 DIAGNOSIS — Z87891 Personal history of nicotine dependence: Secondary | ICD-10-CM | POA: Diagnosis not present

## 2021-04-17 DIAGNOSIS — Z794 Long term (current) use of insulin: Secondary | ICD-10-CM | POA: Diagnosis not present

## 2021-04-17 DIAGNOSIS — E1129 Type 2 diabetes mellitus with other diabetic kidney complication: Secondary | ICD-10-CM | POA: Diagnosis not present

## 2021-04-17 DIAGNOSIS — E1165 Type 2 diabetes mellitus with hyperglycemia: Secondary | ICD-10-CM | POA: Diagnosis not present

## 2021-04-17 DIAGNOSIS — Z992 Dependence on renal dialysis: Secondary | ICD-10-CM | POA: Diagnosis not present

## 2021-04-19 DIAGNOSIS — Z992 Dependence on renal dialysis: Secondary | ICD-10-CM | POA: Diagnosis not present

## 2021-04-19 DIAGNOSIS — D509 Iron deficiency anemia, unspecified: Secondary | ICD-10-CM | POA: Diagnosis not present

## 2021-04-19 DIAGNOSIS — E8779 Other fluid overload: Secondary | ICD-10-CM | POA: Diagnosis not present

## 2021-04-19 DIAGNOSIS — N2581 Secondary hyperparathyroidism of renal origin: Secondary | ICD-10-CM | POA: Diagnosis not present

## 2021-04-19 DIAGNOSIS — D689 Coagulation defect, unspecified: Secondary | ICD-10-CM | POA: Diagnosis not present

## 2021-04-19 DIAGNOSIS — N186 End stage renal disease: Secondary | ICD-10-CM | POA: Diagnosis not present

## 2021-04-20 ENCOUNTER — Other Ambulatory Visit: Payer: Self-pay

## 2021-04-20 ENCOUNTER — Ambulatory Visit (HOSPITAL_COMMUNITY)
Admission: RE | Admit: 2021-04-20 | Discharge: 2021-04-20 | Disposition: A | Discharge status: Home / Self Care | Payer: Medicare HMO | Source: Ambulatory Visit | Attending: Vascular Surgery | Admitting: Vascular Surgery

## 2021-04-20 DIAGNOSIS — I700 Atherosclerosis of aorta: Secondary | ICD-10-CM | POA: Diagnosis not present

## 2021-04-20 DIAGNOSIS — I6523 Occlusion and stenosis of bilateral carotid arteries: Secondary | ICD-10-CM | POA: Insufficient documentation

## 2021-04-20 DIAGNOSIS — I6522 Occlusion and stenosis of left carotid artery: Secondary | ICD-10-CM | POA: Diagnosis not present

## 2021-04-20 DIAGNOSIS — I771 Stricture of artery: Secondary | ICD-10-CM | POA: Diagnosis not present

## 2021-04-20 DIAGNOSIS — I712 Thoracic aortic aneurysm, without rupture: Secondary | ICD-10-CM | POA: Diagnosis not present

## 2021-04-20 DIAGNOSIS — R42 Dizziness and giddiness: Secondary | ICD-10-CM | POA: Diagnosis not present

## 2021-04-20 LAB — POCT I-STAT CREATININE: Creatinine, Ser: 6.1 mg/dL — ABNORMAL HIGH (ref 0.44–1.00)

## 2021-04-20 MED ORDER — IOHEXOL 350 MG/ML SOLN
150.0000 mL | Freq: Once | INTRAVENOUS | Status: AC | PRN
  Administered 2021-04-20: 150 mL via INTRAVENOUS

## 2021-04-21 DIAGNOSIS — N186 End stage renal disease: Secondary | ICD-10-CM | POA: Diagnosis not present

## 2021-04-21 DIAGNOSIS — Z992 Dependence on renal dialysis: Secondary | ICD-10-CM | POA: Diagnosis not present

## 2021-04-21 DIAGNOSIS — E8779 Other fluid overload: Secondary | ICD-10-CM | POA: Diagnosis not present

## 2021-04-21 DIAGNOSIS — D509 Iron deficiency anemia, unspecified: Secondary | ICD-10-CM | POA: Diagnosis not present

## 2021-04-21 DIAGNOSIS — D689 Coagulation defect, unspecified: Secondary | ICD-10-CM | POA: Diagnosis not present

## 2021-04-21 DIAGNOSIS — N2581 Secondary hyperparathyroidism of renal origin: Secondary | ICD-10-CM | POA: Diagnosis not present

## 2021-04-22 DIAGNOSIS — Z992 Dependence on renal dialysis: Secondary | ICD-10-CM | POA: Diagnosis not present

## 2021-04-22 DIAGNOSIS — E8779 Other fluid overload: Secondary | ICD-10-CM | POA: Diagnosis not present

## 2021-04-22 DIAGNOSIS — N186 End stage renal disease: Secondary | ICD-10-CM | POA: Diagnosis not present

## 2021-04-22 DIAGNOSIS — N2581 Secondary hyperparathyroidism of renal origin: Secondary | ICD-10-CM | POA: Diagnosis not present

## 2021-04-22 DIAGNOSIS — D509 Iron deficiency anemia, unspecified: Secondary | ICD-10-CM | POA: Diagnosis not present

## 2021-04-22 DIAGNOSIS — D689 Coagulation defect, unspecified: Secondary | ICD-10-CM | POA: Diagnosis not present

## 2021-04-23 DIAGNOSIS — D509 Iron deficiency anemia, unspecified: Secondary | ICD-10-CM | POA: Diagnosis not present

## 2021-04-23 DIAGNOSIS — Z992 Dependence on renal dialysis: Secondary | ICD-10-CM | POA: Diagnosis not present

## 2021-04-23 DIAGNOSIS — N186 End stage renal disease: Secondary | ICD-10-CM | POA: Diagnosis not present

## 2021-04-23 DIAGNOSIS — N2581 Secondary hyperparathyroidism of renal origin: Secondary | ICD-10-CM | POA: Diagnosis not present

## 2021-04-23 DIAGNOSIS — D689 Coagulation defect, unspecified: Secondary | ICD-10-CM | POA: Diagnosis not present

## 2021-04-23 DIAGNOSIS — E8779 Other fluid overload: Secondary | ICD-10-CM | POA: Diagnosis not present

## 2021-04-25 DIAGNOSIS — E782 Mixed hyperlipidemia: Secondary | ICD-10-CM | POA: Diagnosis not present

## 2021-04-25 DIAGNOSIS — E875 Hyperkalemia: Secondary | ICD-10-CM | POA: Diagnosis not present

## 2021-04-25 DIAGNOSIS — E039 Hypothyroidism, unspecified: Secondary | ICD-10-CM | POA: Diagnosis not present

## 2021-04-25 DIAGNOSIS — E1140 Type 2 diabetes mellitus with diabetic neuropathy, unspecified: Secondary | ICD-10-CM | POA: Diagnosis not present

## 2021-04-25 DIAGNOSIS — E7849 Other hyperlipidemia: Secondary | ICD-10-CM | POA: Diagnosis not present

## 2021-04-25 DIAGNOSIS — N185 Chronic kidney disease, stage 5: Secondary | ICD-10-CM | POA: Diagnosis not present

## 2021-04-26 DIAGNOSIS — Z992 Dependence on renal dialysis: Secondary | ICD-10-CM | POA: Diagnosis not present

## 2021-04-26 DIAGNOSIS — D689 Coagulation defect, unspecified: Secondary | ICD-10-CM | POA: Diagnosis not present

## 2021-04-26 DIAGNOSIS — N186 End stage renal disease: Secondary | ICD-10-CM | POA: Diagnosis not present

## 2021-04-26 DIAGNOSIS — N2581 Secondary hyperparathyroidism of renal origin: Secondary | ICD-10-CM | POA: Diagnosis not present

## 2021-04-26 DIAGNOSIS — D509 Iron deficiency anemia, unspecified: Secondary | ICD-10-CM | POA: Diagnosis not present

## 2021-04-27 DIAGNOSIS — I739 Peripheral vascular disease, unspecified: Secondary | ICD-10-CM | POA: Diagnosis not present

## 2021-04-27 DIAGNOSIS — Z8679 Personal history of other diseases of the circulatory system: Secondary | ICD-10-CM | POA: Diagnosis not present

## 2021-04-27 DIAGNOSIS — I10 Essential (primary) hypertension: Secondary | ICD-10-CM | POA: Diagnosis not present

## 2021-04-27 DIAGNOSIS — Z992 Dependence on renal dialysis: Secondary | ICD-10-CM | POA: Diagnosis not present

## 2021-04-27 DIAGNOSIS — E1140 Type 2 diabetes mellitus with diabetic neuropathy, unspecified: Secondary | ICD-10-CM | POA: Diagnosis not present

## 2021-04-27 DIAGNOSIS — E039 Hypothyroidism, unspecified: Secondary | ICD-10-CM | POA: Diagnosis not present

## 2021-04-27 DIAGNOSIS — N189 Chronic kidney disease, unspecified: Secondary | ICD-10-CM | POA: Diagnosis not present

## 2021-04-27 DIAGNOSIS — J449 Chronic obstructive pulmonary disease, unspecified: Secondary | ICD-10-CM | POA: Diagnosis not present

## 2021-04-28 DIAGNOSIS — D509 Iron deficiency anemia, unspecified: Secondary | ICD-10-CM | POA: Diagnosis not present

## 2021-04-28 DIAGNOSIS — N2581 Secondary hyperparathyroidism of renal origin: Secondary | ICD-10-CM | POA: Diagnosis not present

## 2021-04-28 DIAGNOSIS — N186 End stage renal disease: Secondary | ICD-10-CM | POA: Diagnosis not present

## 2021-04-28 DIAGNOSIS — D689 Coagulation defect, unspecified: Secondary | ICD-10-CM | POA: Diagnosis not present

## 2021-04-28 DIAGNOSIS — Z992 Dependence on renal dialysis: Secondary | ICD-10-CM | POA: Diagnosis not present

## 2021-04-30 DIAGNOSIS — Z992 Dependence on renal dialysis: Secondary | ICD-10-CM | POA: Diagnosis not present

## 2021-04-30 DIAGNOSIS — D689 Coagulation defect, unspecified: Secondary | ICD-10-CM | POA: Diagnosis not present

## 2021-04-30 DIAGNOSIS — D509 Iron deficiency anemia, unspecified: Secondary | ICD-10-CM | POA: Diagnosis not present

## 2021-04-30 DIAGNOSIS — N186 End stage renal disease: Secondary | ICD-10-CM | POA: Diagnosis not present

## 2021-04-30 DIAGNOSIS — N2581 Secondary hyperparathyroidism of renal origin: Secondary | ICD-10-CM | POA: Diagnosis not present

## 2021-05-03 DIAGNOSIS — E1122 Type 2 diabetes mellitus with diabetic chronic kidney disease: Secondary | ICD-10-CM | POA: Diagnosis not present

## 2021-05-03 DIAGNOSIS — N2581 Secondary hyperparathyroidism of renal origin: Secondary | ICD-10-CM | POA: Diagnosis not present

## 2021-05-03 DIAGNOSIS — N186 End stage renal disease: Secondary | ICD-10-CM | POA: Diagnosis not present

## 2021-05-03 DIAGNOSIS — D509 Iron deficiency anemia, unspecified: Secondary | ICD-10-CM | POA: Diagnosis not present

## 2021-05-03 DIAGNOSIS — D689 Coagulation defect, unspecified: Secondary | ICD-10-CM | POA: Diagnosis not present

## 2021-05-03 DIAGNOSIS — Z992 Dependence on renal dialysis: Secondary | ICD-10-CM | POA: Diagnosis not present

## 2021-05-05 DIAGNOSIS — E1122 Type 2 diabetes mellitus with diabetic chronic kidney disease: Secondary | ICD-10-CM | POA: Diagnosis not present

## 2021-05-05 DIAGNOSIS — D509 Iron deficiency anemia, unspecified: Secondary | ICD-10-CM | POA: Diagnosis not present

## 2021-05-05 DIAGNOSIS — D689 Coagulation defect, unspecified: Secondary | ICD-10-CM | POA: Diagnosis not present

## 2021-05-05 DIAGNOSIS — Z992 Dependence on renal dialysis: Secondary | ICD-10-CM | POA: Diagnosis not present

## 2021-05-05 DIAGNOSIS — N186 End stage renal disease: Secondary | ICD-10-CM | POA: Diagnosis not present

## 2021-05-05 DIAGNOSIS — N2581 Secondary hyperparathyroidism of renal origin: Secondary | ICD-10-CM | POA: Diagnosis not present

## 2021-05-06 ENCOUNTER — Encounter: Payer: Self-pay | Admitting: Vascular Surgery

## 2021-05-06 ENCOUNTER — Ambulatory Visit (INDEPENDENT_AMBULATORY_CARE_PROVIDER_SITE_OTHER): Payer: Medicare HMO | Admitting: Vascular Surgery

## 2021-05-06 ENCOUNTER — Other Ambulatory Visit: Payer: Self-pay

## 2021-05-06 VITALS — BP 87/48 | HR 95 | Temp 97.9°F | Resp 20 | Ht 66.0 in | Wt 210.0 lb

## 2021-05-06 DIAGNOSIS — I771 Stricture of artery: Secondary | ICD-10-CM

## 2021-05-06 DIAGNOSIS — I739 Peripheral vascular disease, unspecified: Secondary | ICD-10-CM

## 2021-05-06 NOTE — H&P (View-Only) (Signed)
Patient ID: Donna Howe, female   DOB: 08-23-1956, 65 y.o.   MRN: [id number]  Reason for Consult: No chief complaint on file.   Referred by Curlene Labrum, MD  Subjective:     HPI:  Donna Howe is a 65 y.o. female history of end-stage renal disease previously had left upper extremity AV fistula underwent stenting of her left subclavian artery proximally and balloon angioplasty of her left axillary artery.  She subsequently had the fistula removed for steal.  More recently she underwent balloon angioplasty of the stent and placement of a new left subclavian artery stent.  This was done for patency of left internal mammary artery stenting as coronary bypass graft.  She does have persistent numbness of her medial left 3 fingers.  She recent underwent duplex which demonstrated stenosis of the subclavian artery stent with less than 50% stenosis of the axillary artery.  She now follows up with CT angio.  She is planned for right upper extremity arteriovenous access in the near future.  Past Medical History:  Diagnosis Date   Anemia    Anxiety    Asthma    CAD (coronary artery disease)    Multivessel s/p CABG 2005, numerous PCIs since that time and documented graft disease   Carotid artery disease ([place])    R CEA   ESRD on hemodialysis ([place])    Essential hypertension    Gout    History of blood transfusion    Hyperlipidemia    Hypothyroidism    Myocardial infarction [facility])    PAD (peripheral artery disease) ([place])    Dr. Kellie Simmering   Pneumonia 09/2019, 11/2019   S/P angioplasty with stent- DES to mRCA and to LIMA to LAD with DES 04/09/18.   04/10/2018   SBO (small bowel obstruction) ([place]) 2011   Status post lysis of adhesions & hernia repair   Sinus bradycardia    Type 2 diabetes mellitus ([place])    Umbilical hernia    Family History  Problem Relation Age of Onset   Diabetes Mother    Heart disease Mother        before age 33   Hyperlipidemia Mother    Hypertension Mother     Thyroid disease Father    Hypertension Father    AAA (abdominal aortic aneurysm) Father    Heart disease Brother        before age 61   Hypertension Brother    Hyperlipidemia Son    Hypertension Son    Past Surgical History:  Procedure Laterality Date   AORTIC ARCH ANGIOGRAPHY N/A 08/02/2020   Procedure: AORTIC ARCH ANGIOGRAPHY;  Surgeon: Waynetta Sandy, MD;  Location: [place] [facility];  Service: Cardiovascular;  Laterality: N/A;  Lt upper extermity   AV FISTULA PLACEMENT Left 06/29/2020   Procedure: LEFT ARM ARTERIOVENOUS (AV) FISTULA;  Surgeon: Waynetta Sandy, MD;  Location: [facility] OR;  Service: Vascular;  Laterality: Left;  [place]  2010   CORONARY ARTERY BYPASS GRAFT  2005   CORONARY BALLOON ANGIOPLASTY N/A 05/31/2017   Procedure: CORONARY BALLOON ANGIOPLASTY;  Surgeon: Jettie Booze, MD;  Location: [place] [facility];  Service: Cardiovascular;  Laterality: N/A;   CORONARY STENT INTERVENTION N/A 05/31/2017   Procedure: CORONARY STENT INTERVENTION;  Surgeon: Jettie Booze, MD;  Location: [place] [facility];  Service: Cardiovascular;  Laterality: N/A;   CORONARY STENT INTERVENTION N/A 04/09/2018   Procedure: CORONARY STENT  INTERVENTION;  Surgeon: Jettie Booze, MD;  Location: [place] [facility];  Service: Cardiovascular;  Laterality: N/A;  SVG RCA   CORONARY STENT INTERVENTION N/A 01/10/2021   Procedure: CORONARY STENT INTERVENTION;  Surgeon: Leonie Man, MD;  Location: [place] [facility];  Service: Cardiovascular;  Laterality: N/A;   CORONARY/GRAFT ACUTE MI REVASCULARIZATION N/A 02/24/2021   Procedure: Coronary/Graft Acute MI Revascularization;  Surgeon: Jettie Booze, MD;  Location: [place] [facility];  Service: Cardiovascular;  Laterality: N/A;   ENDARTERECTOMY Right 04/18/2013   Procedure: ENDARTERECTOMY CAROTID;  Surgeon: Mal Misty, MD;  Location: [place];  Service: Vascular;  Laterality: Right;    HERNIA REPAIR  1989   Incisional hernia repair x2  03/04/2010   Laparoscopic with 35cm mesh by Dr Ronnald Collum   IR [place] CV LINE RIGHT  06/21/2020   IR US GUIDE VASC ACCESS RIGHT  06/21/2020   LEFT HEART CATH AND CORS/GRAFTS ANGIOGRAPHY N/A 05/31/2017   Procedure: LEFT HEART CATH AND CORS/GRAFTS ANGIOGRAPHY;  Surgeon: Jettie Booze, MD;  Location: [place] [facility];  Service: Cardiovascular;  Laterality: N/A;   LEFT HEART CATH AND CORS/GRAFTS ANGIOGRAPHY N/A 04/08/2018   Procedure: LEFT HEART CATH AND CORS/GRAFTS ANGIOGRAPHY;  Surgeon: Jettie Booze, MD;  Location: [place] [facility];  Service: Cardiovascular;  Laterality: N/A;   LEFT HEART CATH AND CORS/GRAFTS ANGIOGRAPHY N/A 06/22/2020   Procedure: LEFT HEART CATH AND CORS/GRAFTS ANGIOGRAPHY;  Surgeon: Belva Crome, MD;  Location: [place] [facility];  Service: Cardiovascular;  Laterality: N/A;   LEFT HEART CATH AND CORS/GRAFTS ANGIOGRAPHY N/A 01/10/2021   Procedure: LEFT HEART CATH AND CORS/GRAFTS ANGIOGRAPHY;  Surgeon: Leonie Man, MD;  Location: [place] [facility];  Service: Cardiovascular;  Laterality: N/A;   LEFT HEART CATH AND CORS/GRAFTS ANGIOGRAPHY N/A 02/24/2021   Procedure: LEFT HEART CATH AND CORS/GRAFTS ANGIOGRAPHY;  Surgeon: Jettie Booze, MD;  Location: [place] [facility];  Service: Cardiovascular;  Laterality: N/A;   LEFT HEART CATHETERIZATION WITH CORONARY ANGIOGRAM N/A 12/19/2012   Procedure: LEFT HEART CATHETERIZATION WITH CORONARY ANGIOGRAM;  Surgeon: Josue Hector, MD;  Location: [facility] CATH LAB;  Service: Cardiovascular;  Laterality: N/A;   LEFT HEART CATHETERIZATION WITH CORONARY/GRAFT ANGIOGRAM N/A 04/19/2013   Procedure: LEFT HEART CATHETERIZATION WITH Beatrix Fetters;  Surgeon: Lorretta Harp, MD;  Location: [facility] CATH LAB;  Service: Cardiovascular;  Laterality: N/A;   LIGATION OF ARTERIOVENOUS  FISTULA Left 09/15/2020   Procedure: LIGATION OF LEFT ARM ARTERIOVENOUS  FISTULA;  Surgeon:  Waynetta Sandy, MD;  Location: [place];  Service: Vascular;  Laterality: Left;   PATCH ANGIOPLASTY Right 04/18/2013   Procedure: PATCH ANGIOPLASTY Right Internal Carotid Artery;  Surgeon: Mal Misty, MD;  Location: [place];  Service: Vascular;  Laterality: Right;   PERCUTANEOUS CORONARY STENT INTERVENTION (PCI-S) Right 12/19/2012   Procedure: PERCUTANEOUS CORONARY STENT INTERVENTION (PCI-S);  Surgeon: Josue Hector, MD;  Location[facility] CATH LAB;  Service: Cardiovascular;  Laterality: Right;   PERIPHERAL VASCULAR INTERVENTION Left 08/02/2020   Procedure: PERIPHERAL VASCULAR INTERVENTION;  Surgeon: Waynetta Sandy, MD;  Location: [place] [facility];  Service: Cardiovascular;  Laterality: Left;  Left subclavian   PERIPHERAL VASCULAR INTERVENTION  02/24/2021   Procedure: PERIPHERAL VASCULAR INTERVENTION;  Surgeon: Marty Heck, MD;  Location: [place] [facility];  Service: Vascular;;   SHOULDER SURGERY      Short Social History:  Social History   Tobacco Use   Smoking status:  Former    Packs/day: 1.00    Years: 20.00    Pack years: 20.00    Types: Cigarettes    Quit date: 12/10/2012    Years since quitting: 8.4   Smokeless tobacco: Never  Substance Use Topics   Alcohol use: No    Alcohol/week: 0.0 standard drinks    Allergies  Allergen Reactions   Penicillins Other (See Comments)    REACTION: Unknown, told as a child Has patient had a PCN reaction causing immediate rash, facial/tongue/throat swelling, SOB or lightheadedness with hypotension: Unknown Has patient had a PCN reaction causing severe rash involving mucus membranes or skin necrosis: Unknown Has patient had a PCN reaction that required hospitalization: Unknown Has patient had a PCN reaction occurring within the last 10 years: No If all of the above answers are "NO", then may proceed with Cephalosporin use.    Beta Adrenergic Blockers Other (See Comments)    Hypotension    Current Outpatient  Medications  Medication Sig Dispense Refill   Albuterol Sulfate 108 (90 Base) MCG/ACT AEPB Inhale 2 puffs into the lungs every 6 (six) hours as needed (Shortness of breath).     allopurinol (ZYLOPRIM) 100 MG tablet Take one tablet after hemodialysis, on Tuesday, Thursday and Saturday. (Patient taking differently: Take 100 mg by mouth Every Tuesday,Thursday,and Saturday with dialysis. After dialysis) 30 tablet 11   ALPRAZolam (XANAX) 0.5 MG tablet Take 0.25-0.5 mg by mouth See admin instructions. Take 0.25 in the morning and evening and 0.5 mg at bedtime     aspirin EC 81 MG tablet Take 81 mg by mouth daily.      atorvastatin (LIPITOR) 80 MG tablet Take 1 tablet (80 mg total) by mouth every evening. (Patient taking differently: Take 80 mg by mouth at bedtime.)     clopidogrel (PLAVIX) 75 MG tablet Take 1 tablet (75 mg total) by mouth daily. 90 tablet 3   ezetimibe (ZETIA) 10 MG tablet Take 1 tablet (10 mg total) by mouth daily. (Patient taking differently: Take 10 mg by mouth at bedtime.) 30 tablet 11   ferric citrate (AURYXIA) 1 GM 210 MG(Fe) tablet Take 420 mg by mouth 3 (three) times daily with meals.     gabapentin (NEURONTIN) 400 MG capsule Take 1 capsule (400 mg total) by mouth 2 (two) times daily.     ipratropium (ATROVENT HFA) 17 MCG/ACT inhaler Inhale 2 puffs into the lungs every 4 (four) hours as needed for wheezing.      ketorolac (ACULAR) 0.4 % SOLN Place 1 drop into the right eye 4 (four) times daily.     levothyroxine (SYNTHROID) 112 MCG tablet Take 112 mcg by mouth daily before breakfast.     multivitamin (RENA-VIT) TABS tablet Take 1 tablet by mouth daily.     nitroGLYCERIN (NITROSTAT) 0.4 MG SL tablet Place 1 tablet (0.4 mg total) under the tongue every 5 (five) minutes x 3 doses as needed for chest pain. 100 tablet 3   NOVOLIN 70/30 FLEXPEN (70-30) 100 UNIT/ML KwikPen Inject 24 Units into the skin in the morning and at bedtime.     ondansetron (ZOFRAN) 8 MG tablet Take 8 mg by mouth  every 8 (eight) hours as needed for nausea.     pantoprazole (PROTONIX) 40 MG tablet Take 1 tablet (40 mg total) by mouth 2 (two) times daily. 60 tablet 1   prednisoLONE acetate (PRED FORTE) 1 % ophthalmic suspension Place 1 drop into the right eye daily.     ranolazine (RANEXA)  500 MG 12 hr tablet Take 1 tablet (500 mg total) by mouth 2 (two) times daily. 60 tablet 1   Vitamin D, Ergocalciferol, (DRISDOL) 1.25 MG (50000 UNIT) CAPS capsule Take 50,000 Units by mouth every Sunday.      No current facility-administered medications for this visit.    Review of Systems  Constitutional:  Constitutional negative. HENT: HENT negative.  Eyes: Eyes negative.  Respiratory: Respiratory negative.  Cardiovascular: Cardiovascular negative.  GI: Gastrointestinal negative.  Skin: Skin negative.  Neurological: Positive for numbness.  Hematologic: Hematologic/lymphatic negative.  Psychiatric: Psychiatric negative.       Objective:  Objective   There were no vitals filed for this visit. There is no height or weight on file to calculate BMI.  Physical Exam HENT:     Head: Normocephalic.     Nose:     Comments: Wearing a mask Neck:     Comments: Right IJ catheter in place Cardiovascular:     Comments: Palpable right radial artery pulse 1+, 2+ palpable right ulnar pulse Monophasic left radial and ulnar signals as well as palmar arch Pulmonary:     Effort: Pulmonary effort is normal.  Abdominal:     General: Abdomen is flat.  Musculoskeletal:        General: Normal range of motion.     Cervical back: Neck supple.  Skin:    General: Skin is warm and dry.     Capillary Refill: Capillary refill takes less than 2 seconds.  Neurological:     General: No focal deficit present.     Mental Status: She is alert.  Psychiatric:        Mood and Affect: Mood normal.        Thought Content: Thought content normal.        Judgment: Judgment normal.    CT IMPRESSION: Left subclavian artery stent  is patent. Although assessment for in stent stenosis is limited by the blooming artifact, this is unlikely given the results of the recent noninvasive exam.   Short segment high-grade stenosis of the proximal left axillary artery overlying the first rib, which accounts for the signal change on recent noninvasive exam.   Stent at the origin of the LIMA graft is patent.     Assessment/Plan:     64  year old female with end-stage renal disease.  We are planning right upper extremity AV fistula versus graft next Wednesday.  She will continue aspirin and Plavix for this.  She does have improving sensation in use of her left hand after ligation of the fistula on the left side for steal.  She we reviewed her CT scan today which demonstrates patency of her left subclavian artery stent with restenosis of her left axillary artery at the level of the first rib.  Given that her hand is improving and that the stenosis is distal to her LIMA graft I have discussed with her continued medical management.  Certainly if her hand worsens we would consider repeat angiography for possible stenting of the left axillary artery where previously there was balloon angioplasty performed.  In the interim we will plan for right arm AV access procedure.  She will follow-up with left upper extremity arterial duplex in 6 months.     Waynetta Sandy MD [facility]

## 2021-05-06 NOTE — Progress Notes (Signed)
Patient ID: Donna Howe, female   DOB: Nov 15, 1955, 65 y.o.   MRN: [id number]  Reason for Consult: No chief complaint on file.   Referred by Curlene Labrum, MD  Subjective:     HPI:  Donna Howe is a 65 y.o. female history of end-stage renal disease previously had left upper extremity AV fistula underwent stenting of her left subclavian artery proximally and balloon angioplasty of her left axillary artery.  She subsequently had the fistula removed for steal.  More recently she underwent balloon angioplasty of the stent and placement of a new left subclavian artery stent.  This was done for patency of left internal mammary artery stenting as coronary bypass graft.  She does have persistent numbness of her medial left 3 fingers.  She recent underwent duplex which demonstrated stenosis of the subclavian artery stent with less than 50% stenosis of the axillary artery.  She now follows up with CT angio.  She is planned for right upper extremity arteriovenous access in the near future.  Past Medical History:  Diagnosis Date   Anemia    Anxiety    Asthma    CAD (coronary artery disease)    Multivessel s/p CABG 2005, numerous PCIs since that time and documented graft disease   Carotid artery disease ([place])    R CEA   ESRD on hemodialysis ([place])    Essential hypertension    Gout    History of blood transfusion    Hyperlipidemia    Hypothyroidism    Myocardial infarction [facility])    PAD (peripheral artery disease) ([place])    Dr. Kellie Simmering   Pneumonia 09/2019, 11/2019   S/P angioplasty with stent- DES to mRCA and to LIMA to LAD with DES 04/09/18.   04/10/2018   SBO (small bowel obstruction) ([place]) 2011   Status post lysis of adhesions & hernia repair   Sinus bradycardia    Type 2 diabetes mellitus ([place])    Umbilical hernia    Family History  Problem Relation Age of Onset   Diabetes Mother    Heart disease Mother        before age 16   Hyperlipidemia Mother    Hypertension Mother     Thyroid disease Father    Hypertension Father    AAA (abdominal aortic aneurysm) Father    Heart disease Brother        before age 6   Hypertension Brother    Hyperlipidemia Son    Hypertension Son    Past Surgical History:  Procedure Laterality Date   AORTIC ARCH ANGIOGRAPHY N/A 08/02/2020   Procedure: AORTIC ARCH ANGIOGRAPHY;  Surgeon: Waynetta Sandy, MD;  Location: [place] [facility];  Service: Cardiovascular;  Laterality: N/A;  Lt upper extermity   AV FISTULA PLACEMENT Left 06/29/2020   Procedure: LEFT ARM ARTERIOVENOUS (AV) FISTULA;  Surgeon: Waynetta Sandy, MD;  Location: [facility] OR;  Service: Vascular;  Laterality: Left;  [place]  2010   CORONARY ARTERY BYPASS GRAFT  2005   CORONARY BALLOON ANGIOPLASTY N/A 05/31/2017   Procedure: CORONARY BALLOON ANGIOPLASTY;  Surgeon: Jettie Booze, MD;  Location: [place] [facility];  Service: Cardiovascular;  Laterality: N/A;   CORONARY STENT INTERVENTION N/A 05/31/2017   Procedure: CORONARY STENT INTERVENTION;  Surgeon: Jettie Booze, MD;  Location: [place] [facility];  Service: Cardiovascular;  Laterality: N/A;   CORONARY STENT INTERVENTION N/A 04/09/2018   Procedure: CORONARY STENT  INTERVENTION;  Surgeon: Jettie Booze, MD;  Location: [place] [facility];  Service: Cardiovascular;  Laterality: N/A;  SVG RCA   CORONARY STENT INTERVENTION N/A 01/10/2021   Procedure: CORONARY STENT INTERVENTION;  Surgeon: Leonie Man, MD;  Location: [place] [facility];  Service: Cardiovascular;  Laterality: N/A;   CORONARY/GRAFT ACUTE MI REVASCULARIZATION N/A 02/24/2021   Procedure: Coronary/Graft Acute MI Revascularization;  Surgeon: Jettie Booze, MD;  Location: [place] [facility];  Service: Cardiovascular;  Laterality: N/A;   ENDARTERECTOMY Right 04/18/2013   Procedure: ENDARTERECTOMY CAROTID;  Surgeon: Mal Misty, MD;  Location: [place];  Service: Vascular;  Laterality: Right;    HERNIA REPAIR  1989   Incisional hernia repair x2  03/04/2010   Laparoscopic with 35cm mesh by Dr Ronnald Collum   IR [place] CV LINE RIGHT  06/21/2020   IR US GUIDE VASC ACCESS RIGHT  06/21/2020   LEFT HEART CATH AND CORS/GRAFTS ANGIOGRAPHY N/A 05/31/2017   Procedure: LEFT HEART CATH AND CORS/GRAFTS ANGIOGRAPHY;  Surgeon: Jettie Booze, MD;  Location: [place] [facility];  Service: Cardiovascular;  Laterality: N/A;   LEFT HEART CATH AND CORS/GRAFTS ANGIOGRAPHY N/A 04/08/2018   Procedure: LEFT HEART CATH AND CORS/GRAFTS ANGIOGRAPHY;  Surgeon: Jettie Booze, MD;  Location: [place] [facility];  Service: Cardiovascular;  Laterality: N/A;   LEFT HEART CATH AND CORS/GRAFTS ANGIOGRAPHY N/A 06/22/2020   Procedure: LEFT HEART CATH AND CORS/GRAFTS ANGIOGRAPHY;  Surgeon: Belva Crome, MD;  Location: [place] [facility];  Service: Cardiovascular;  Laterality: N/A;   LEFT HEART CATH AND CORS/GRAFTS ANGIOGRAPHY N/A 01/10/2021   Procedure: LEFT HEART CATH AND CORS/GRAFTS ANGIOGRAPHY;  Surgeon: Leonie Man, MD;  Location: [place] [facility] LAB;  Service: Cardiovascular;  Laterality: N/A;   LEFT HEART CATH AND CORS/GRAFTS ANGIOGRAPHY N/A 02/24/2021   Procedure: LEFT HEART CATH AND CORS/GRAFTS ANGIOGRAPHY;  Surgeon: Jettie Booze, MD;  Location: [place] [facility];  Service: Cardiovascular;  Laterality: N/A;   LEFT HEART CATHETERIZATION WITH CORONARY ANGIOGRAM N/A 12/19/2012   Procedure: LEFT HEART CATHETERIZATION WITH CORONARY ANGIOGRAM;  Surgeon: Josue Hector, MD;  Location[facility] CATH LAB;  Service: Cardiovascular;  Laterality: N/A;   LEFT HEART CATHETERIZATION WITH CORONARY/GRAFT ANGIOGRAM N/A 04/19/2013   Procedure: LEFT HEART CATHETERIZATION WITH Beatrix Fetters;  Surgeon: Lorretta Harp, MD;  Location: [facility] CATH LAB;  Service: Cardiovascular;  Laterality: N/A;   LIGATION OF ARTERIOVENOUS  FISTULA Left 09/15/2020   Procedure: LIGATION OF LEFT ARM ARTERIOVENOUS  FISTULA;  Surgeon:  Waynetta Sandy, MD;  Location: [place];  Service: Vascular;  Laterality: Left;   PATCH ANGIOPLASTY Right 04/18/2013   Procedure: PATCH ANGIOPLASTY Right Internal Carotid Artery;  Surgeon: Mal Misty, MD;  Location: [place];  Service: Vascular;  Laterality: Right;   PERCUTANEOUS CORONARY STENT INTERVENTION (PCI-S) Right 12/19/2012   Procedure: PERCUTANEOUS CORONARY STENT INTERVENTION (PCI-S);  Surgeon: Josue Hector, MD;  Location: [facility] CATH LAB;  Service: Cardiovascular;  Laterality: Right;   PERIPHERAL VASCULAR INTERVENTION Left 08/02/2020   Procedure: PERIPHERAL VASCULAR INTERVENTION;  Surgeon: Waynetta Sandy, MD;  Location: [place] CV LAB;  Service: Cardiovascular;  Laterality: Left;  Left subclavian   PERIPHERAL VASCULAR INTERVENTION  02/24/2021   Procedure: PERIPHERAL VASCULAR INTERVENTION;  Surgeon: Marty Heck, MD;  Location: [place] [facility] LAB;  Service: Vascular;;   SHOULDER SURGERY      Short Social History:  Social History   Tobacco Use   Smoking status:  Former    Packs/day: 1.00    Years: 20.00    Pack years: 20.00    Types: Cigarettes    Quit date: 12/10/2012    Years since quitting: 8.4   Smokeless tobacco: Never  Substance Use Topics   Alcohol use: No    Alcohol/week: 0.0 standard drinks    Allergies  Allergen Reactions   Penicillins Other (See Comments)    REACTION: Unknown, told as a child Has patient had a PCN reaction causing immediate rash, facial/tongue/throat swelling, SOB or lightheadedness with hypotension: Unknown Has patient had a PCN reaction causing severe rash involving mucus membranes or skin necrosis: Unknown Has patient had a PCN reaction that required hospitalization: Unknown Has patient had a PCN reaction occurring within the last 10 years: No If all of the above answers are "NO", then may proceed with Cephalosporin use.    Beta Adrenergic Blockers Other (See Comments)    Hypotension    Current Outpatient  Medications  Medication Sig Dispense Refill   Albuterol Sulfate 108 (90 Base) MCG/ACT AEPB Inhale 2 puffs into the lungs every 6 (six) hours as needed (Shortness of breath).     allopurinol (ZYLOPRIM) 100 MG tablet Take one tablet after hemodialysis, on Tuesday, Thursday and Saturday. (Patient taking differently: Take 100 mg by mouth Every Tuesday,Thursday,and Saturday with dialysis. After dialysis) 30 tablet 11   ALPRAZolam (XANAX) 0.5 MG tablet Take 0.25-0.5 mg by mouth See admin instructions. Take 0.25 in the morning and evening and 0.5 mg at bedtime     aspirin EC 81 MG tablet Take 81 mg by mouth daily.      atorvastatin (LIPITOR) 80 MG tablet Take 1 tablet (80 mg total) by mouth every evening. (Patient taking differently: Take 80 mg by mouth at bedtime.)     clopidogrel (PLAVIX) 75 MG tablet Take 1 tablet (75 mg total) by mouth daily. 90 tablet 3   ezetimibe (ZETIA) 10 MG tablet Take 1 tablet (10 mg total) by mouth daily. (Patient taking differently: Take 10 mg by mouth at bedtime.) 30 tablet 11   ferric citrate (AURYXIA) 1 GM 210 MG(Fe) tablet Take 420 mg by mouth 3 (three) times daily with meals.     gabapentin (NEURONTIN) 400 MG capsule Take 1 capsule (400 mg total) by mouth 2 (two) times daily.     ipratropium (ATROVENT HFA) 17 MCG/ACT inhaler Inhale 2 puffs into the lungs every 4 (four) hours as needed for wheezing.      ketorolac (ACULAR) 0.4 % SOLN Place 1 drop into the right eye 4 (four) times daily.     levothyroxine (SYNTHROID) 112 MCG tablet Take 112 mcg by mouth daily before breakfast.     multivitamin (RENA-VIT) TABS tablet Take 1 tablet by mouth daily.     nitroGLYCERIN (NITROSTAT) 0.4 MG SL tablet Place 1 tablet (0.4 mg total) under the tongue every 5 (five) minutes x 3 doses as needed for chest pain. 100 tablet 3   NOVOLIN 70/30 FLEXPEN (70-30) 100 UNIT/ML KwikPen Inject 24 Units into the skin in the morning and at bedtime.     ondansetron (ZOFRAN) 8 MG tablet Take 8 mg by mouth  every 8 (eight) hours as needed for nausea.     pantoprazole (PROTONIX) 40 MG tablet Take 1 tablet (40 mg total) by mouth 2 (two) times daily. 60 tablet 1   prednisoLONE acetate (PRED FORTE) 1 % ophthalmic suspension Place 1 drop into the right eye daily.     ranolazine (RANEXA)  500 MG 12 hr tablet Take 1 tablet (500 mg total) by mouth 2 (two) times daily. 60 tablet 1   Vitamin D, Ergocalciferol, (DRISDOL) 1.25 MG (50000 UNIT) CAPS capsule Take 50,000 Units by mouth every Sunday.      No current facility-administered medications for this visit.    Review of Systems  Constitutional:  Constitutional negative. HENT: HENT negative.  Eyes: Eyes negative.  Respiratory: Respiratory negative.  Cardiovascular: Cardiovascular negative.  GI: Gastrointestinal negative.  Skin: Skin negative.  Neurological: Positive for numbness.  Hematologic: Hematologic/lymphatic negative.  Psychiatric: Psychiatric negative.       Objective:  Objective   There were no vitals filed for this visit. There is no height or weight on file to calculate BMI.  Physical Exam HENT:     Head: Normocephalic.     Nose:     Comments: Wearing a mask Neck:     Comments: Right IJ catheter in place Cardiovascular:     Comments: Palpable right radial artery pulse 1+, 2+ palpable right ulnar pulse Monophasic left radial and ulnar signals as well as palmar arch Pulmonary:     Effort: Pulmonary effort is normal.  Abdominal:     General: Abdomen is flat.  Musculoskeletal:        General: Normal range of motion.     Cervical back: Neck supple.  Skin:    General: Skin is warm and dry.     Capillary Refill: Capillary refill takes less than 2 seconds.  Neurological:     General: No focal deficit present.     Mental Status: She is alert.  Psychiatric:        Mood and Affect: Mood normal.        Thought Content: Thought content normal.        Judgment: Judgment normal.    CT IMPRESSION: Left subclavian artery stent  is patent. Although assessment for in stent stenosis is limited by the blooming artifact, this is unlikely given the results of the recent noninvasive exam.   Short segment high-grade stenosis of the proximal left axillary artery overlying the first rib, which accounts for the signal change on recent noninvasive exam.   Stent at the origin of the LIMA graft is patent.     Assessment/Plan:     64  year old female with end-stage renal disease.  We are planning right upper extremity AV fistula versus graft next Wednesday.  She will continue aspirin and Plavix for this.  She does have improving sensation in use of her left hand after ligation of the fistula on the left side for steal.  She we reviewed her CT scan today which demonstrates patency of her left subclavian artery stent with restenosis of her left axillary artery at the level of the first rib.  Given that her hand is improving and that the stenosis is distal to her LIMA graft I have discussed with her continued medical management.  Certainly if her hand worsens we would consider repeat angiography for possible stenting of the left axillary artery where previously there was balloon angioplasty performed.  In the interim we will plan for right arm AV access procedure.  She will follow-up with left upper extremity arterial duplex in 6 months.     Waynetta Sandy MD [facility]

## 2021-05-07 DIAGNOSIS — D689 Coagulation defect, unspecified: Secondary | ICD-10-CM | POA: Diagnosis not present

## 2021-05-07 DIAGNOSIS — Z992 Dependence on renal dialysis: Secondary | ICD-10-CM | POA: Diagnosis not present

## 2021-05-07 DIAGNOSIS — N186 End stage renal disease: Secondary | ICD-10-CM | POA: Diagnosis not present

## 2021-05-07 DIAGNOSIS — E1122 Type 2 diabetes mellitus with diabetic chronic kidney disease: Secondary | ICD-10-CM | POA: Diagnosis not present

## 2021-05-07 DIAGNOSIS — N2581 Secondary hyperparathyroidism of renal origin: Secondary | ICD-10-CM | POA: Diagnosis not present

## 2021-05-07 DIAGNOSIS — D509 Iron deficiency anemia, unspecified: Secondary | ICD-10-CM | POA: Diagnosis not present

## 2021-05-10 ENCOUNTER — Encounter (HOSPITAL_COMMUNITY): Payer: Self-pay | Admitting: Vascular Surgery

## 2021-05-10 ENCOUNTER — Other Ambulatory Visit: Payer: Self-pay

## 2021-05-10 DIAGNOSIS — N186 End stage renal disease: Secondary | ICD-10-CM | POA: Diagnosis not present

## 2021-05-10 DIAGNOSIS — N2581 Secondary hyperparathyroidism of renal origin: Secondary | ICD-10-CM | POA: Diagnosis not present

## 2021-05-10 DIAGNOSIS — D509 Iron deficiency anemia, unspecified: Secondary | ICD-10-CM | POA: Diagnosis not present

## 2021-05-10 DIAGNOSIS — D689 Coagulation defect, unspecified: Secondary | ICD-10-CM | POA: Diagnosis not present

## 2021-05-10 DIAGNOSIS — Z992 Dependence on renal dialysis: Secondary | ICD-10-CM | POA: Diagnosis not present

## 2021-05-10 NOTE — Progress Notes (Signed)
Anesthesia Chart Review: Same day workup  Follows with cardiology for history of HLD, HTN, CAD s/p CABG in 2005 with multiple PCI's since with the most recent being an NSTEMI in 12/2020 with DESx2 to SVG-RCA and recent NSTEMI in 02/2021 and found to have 75% stenosis along the SVG to PDA which was treated with scoring balloon angioplasty and also received DES placement to 80% stenosis along the mid graft lesion. Also required intervention to her subclavian stent as it was felt this could be compromising flow into the LIMA.  Last seen by cardiology 04/06/2026 discussed upcoming vascular surgery.  Per note, ". She is scheduled for right AV fistula creation later this month and we reviewed today that ASA and Plavix cannot be held for the procedure but she reports being informed that she would be able to continue the medications prior to surgery. Will forward today's note to Vascular. Would not anticipate additional cardiac testing prior to surgery given recent stent placement."  ESRD via right IJ [facility] on Tuesday Thursday Saturday.  IDDM 2, last A1c 6.9 on 02/22/2021.  Patient will need day of surgery labs and evaluation.  PFT 01/26/2020: FVC-%Pred-Pre Latest Units: % 57  FEV1-%Pred-Pre Latest Units: % 54  FEV1FVC-%Pred-Pre Latest Units: % 92  TLC % pred Latest Units: % 73  RV % pred Latest Units: % 85  DLCO unc % pred Latest Units: % 66   EKG 04/06/2021: Sinus rhythm with possible premature atrial complexes with aberrancy.  Possible LAE.  Rate 83.  ST and T wave abnormality, consider lateral ischemia.  Prolonged QT (QTC 502).  Cath 02/24/2021: Ost LM to Dist LM lesion is 95% stenosed. LIMA to LAD is patent. Prox LAD to Mid LAD lesion is 100% stenosed. Known from prior catheterization. Unable to advance left catheter through the severely diseased subclavian stent. Ost LAD to Prox LAD lesion is 100% stenosed. 1st Mrg-1 lesion is 99% stenosed. SVG to OM occluded. 1st Mrg-2 lesion is 100% stenosed. Ost  Cx to Mid Cx lesion is 99% stenosed. Origin to Prox Graft lesion before 1st Mrg is 100% stenosed. LIMA and is normal in caliber. Ostial LIMA stent is 60% narrowed. Prox RCA lesion is 100% stenosed. SVG to PDA showed prox Graft lesion is 75% stenosed. Scoring balloon angioplasty was performed using a BALLOON SCOREFLEX 3.0X10. Multipurpose guide catheter was able to track through the subclavian stent. Post intervention, there is a 0% residual stenosis. SVG to PDA showed mid Graft lesion is 80% stenosed. A drug-eluting stent was successfully placed using a SYNERGY XD 3.50X16. Post intervention, there is a 0% residual stenosis. LV end diastolic pressure is moderately elevated. There is no aortic valve stenosis. Origin lesion is 20% stenosed. Due to the severe in-stent restenosis of the subclavian stent, Dr. Carlis Abbott was called.  I suspect this was causing anterior wall ischemia due to compromising flow into the LIMA.  There was a 30 mm gradient across the stent.  There was clear difficulty getting equipment across the stent to the heart as noted in the report.  He is planning to place a covered stent over the disease to bare-metal stent which was previously placed.   From a cardiac standpoint, continue aggressive secondary prevention.  She will need dual antiplatelet therapy.   Wynonia Musty [facility] Stay [facility]/Anesthesiology Phone [phone] 05/10/2021 1:53 PM

## 2021-05-10 NOTE — Anesthesia Preprocedure Evaluation (Addendum)
Anesthesia Evaluation  Patient identified by MRN, date of birth, ID band Patient awake    Reviewed: Allergy & Precautions, NPO status , Patient's Chart, lab work & pertinent test results  History of Anesthesia Complications Negative for: history of anesthetic complications  Airway Mallampati: III  TM Distance: >3 FB Neck ROM: Full    Dental  (+) Dental Advisory Given,    Pulmonary neg shortness of breath, asthma , neg sleep apnea, neg COPD, neg recent URI, former smoker,  Covid-19 Nucleic Acid Test Results Lab Results      Component                Value               Date                      SARSCOV2NAA              NEGATIVE            02/22/2021                [place]              NEGATIVE            01/09/2021                [place] (A)        10/07/2020                [place]              NEGATIVE            09/13/2020                [place]              NEGATIVE            07/30/2020              breath sounds clear to auscultation       Cardiovascular hypertension, (-) angina+ CAD, + Past MI, + Cardiac Stents, + CABG and + Peripheral Vascular Disease   Rhythm:Regular  1. Left ventricular ejection fraction, by estimation, is 55 to 60%. The  left ventricle has normal function. The left ventricle has no regional  wall motion abnormalities.  2. The mitral valve is normal in structure. Mild to moderate mitral valve  regurgitation. No evidence of mitral stenosis.  3. Limited echocardiogram to reevalaute LV function and wall motion.    Neuro/Psych PSYCHIATRIC DISORDERS Anxiety negative neurological ROS     GI/Hepatic GERD  ,  Endo/Other  diabetes, Type 2Hypothyroidism Lab Results      Component                Value               Date                      HGBA1C                   6.9 (H)             02/22/2021             Renal/GU ESRF and DialysisRenal diseaseLast hd 8/23      Musculoskeletal negative musculoskeletal ROS (+)   Abdominal  Peds  Hematology Lab Results      Component                Value               Date                      WBC                      8.4                 02/25/2021                HGB                      12.9                05/11/2021                HCT                      38.0                05/11/2021                MCV                      101.0 (H)           02/25/2021                PLT                      161                 02/25/2021             Asa and plavix   Anesthesia Other Findings Follows with cardiology for history of HLD, HTN, CAD s/p CABG in 2005 with multiple PCI's sincewith the most recent being anNSTEMI in 12/2020 with DESx2 to SVG-RCAand recent NSTEMI in 02/2021 and found to have75% stenosis along the SVG to PDA whichwas treated with scoring balloon angioplasty and also received DES placement to80% stenosis along the mid graft lesion.Also required intervention to her subclavian stent as it was felt this could be compromising flow into the LIMA.Last seen by cardiology 04/06/2026 discussed upcoming vascular surgery.  Per note, ". She is scheduled for right AV fistula creation later this month and we reviewed today that ASA and Plavix cannot be held for the procedure but she reports being informed that she would be able to continue the medications prior to surgery.Will forward today's note to Vascular.Would not anticipate additional cardiac testing prior to surgery given recent stent placement."  ESRD via right IJ [facility] on Tuesday Thursday Saturday.  IDDM 2, last A1c 6.9 on 02/22/2021.  Patient will need day of surgery labs and evaluation.  PFT 01/26/2020: FVC-%Pred-Pre Latest Units: % 57 FEV1-%Pred-Pre Latest Units: % 54 FEV1FVC-%Pred-Pre Latest Units: % 92 TLC % pred Latest Units: % 73 RV % pred Latest Units: % 85 DLCO unc % pred Latest Units: % 66  EKG 04/06/2021: Sinus rhythm with possible  premature atrial complexes with aberrancy.  Possible LAE.  Rate 83.  ST and T wave abnormality, consider lateral ischemia.  Prolonged QT (QTC 502).  Cath 02/24/2021: ? Ost LM to Dist LM lesion is 95% stenosed. LIMA  to LAD is patent. ? Prox LAD to Mid LAD lesion is 100% stenosed. Known from prior catheterization. Unable to advance left catheter through the severely diseased subclavian stent. ? Ost LAD to Prox LAD lesion is 100% stenosed. ? 1st Mrg-1 lesion is 99% stenosed. SVG to OM occluded. ? 1st Mrg-2 lesion is 100% stenosed. ? Ost Cx to Mid Cx lesion is 99% stenosed. ? Origin to Prox Graft lesion before 1st Mrg is 100% stenosed. ? LIMA and is normal in caliber. Ostial LIMA stent is 60% narrowed. ? Prox RCA lesion is 100% stenosed. ? SVG to PDA showed prox Graft lesion is 75% stenosed. Scoring balloon angioplasty was performed using a BALLOON SCOREFLEX 3.0X10. Multipurpose guide catheter was able to track through the subclavian stent. ? Post intervention, there is a 0% residual stenosis. ? SVG to PDA showed mid Graft lesion is 80% stenosed. A drug-eluting stent was successfully placed using a SYNERGY XD 3.50X16. ? Post intervention, there is a 0% residual stenosis. ? LV end diastolic pressure is moderately elevated. ? There is no aortic valve stenosis. ? Origin lesion is 20% stenosed. Due to the severe in-stent restenosis of the subclavian stent, Dr. Carlis Abbott was called. I suspect this was causing anterior wall ischemia due to compromising flow into the LIMA. There was a 30 mm gradient across the stent. There was clear difficulty getting equipment across the stent to the heart as noted in the report. He is planning to place a covered stent over the disease to bare-metal stent which was previously placed.  From a cardiac standpoint, continue aggressive secondary prevention. She will need dual antiplatelet therapy.   Reproductive/Obstetrics                                                             Anesthesia Evaluation  Patient identified by MRN, date of birth, ID band Patient awake    Reviewed: Allergy & Precautions, NPO status , Patient's Chart, lab work & pertinent test results  Airway Mallampati: II  TM Distance: >3 FB Neck ROM: Full    Dental  (+) Poor Dentition, Chipped,    Pulmonary shortness of breath and with exertion, asthma , former smoker,  Quit smoking 2014, 20 pack year history    Pulmonary exam normal breath sounds clear to auscultation       Cardiovascular hypertension, + CAD, + Past MI, + Cardiac Stents (2018, 2019), + CABG (2005) and + Peripheral Vascular Disease  Normal cardiovascular exam+ Valvular Problems/Murmurs (mild-mod MR) MR  Rhythm:Regular Rate:Normal  Echo 06/2020: 1. Left ventricular ejection fraction, by estimation, is 55 to 60%. The  left ventricle has normal function. The left ventricle has no regional  wall motion abnormalities.  2. The mitral valve is normal in structure. Mild to moderate mitral valve  regurgitation. No evidence of mitral stenosis.  3. Limited echocardiogram to reevalaute LV function and wall motion.   On aspirin, plavix- instructed to continue preop   Admitted 06/2020- s/p LHC 06/22/20 showing patent LIMA-LAD with 50-70% ISR, 99% CX stent, no obvious target for PCI- not a candidate for redo CABG per cardiac surgery    Neuro/Psych PSYCHIATRIC DISORDERS Anxiety negative neurological ROS     GI/Hepatic Neg liver ROS, GERD  Medicated and Controlled,  Endo/Other  diabetes, Poorly Controlled, Type 2,  Insulin DependentHypothyroidism   Renal/GU ESRF and DialysisRenal diseaseL arm steal syndrome  Last HD yesterday  K 3.3 today   negative genitourinary   Musculoskeletal negative musculoskeletal ROS (+)   Abdominal (+) + obese,   Peds  Hematology  (+) Blood dyscrasia, anemia ,   Anesthesia Other Findings   Reproductive/Obstetrics negative OB ROS                           Anesthesia Physical Anesthesia Plan  ASA: IV  Anesthesia Plan: General   Post-op Pain Management:    Induction: Intravenous  PONV Risk Score and Plan: 3 and Ondansetron, Dexamethasone, Treatment may vary due to age or medical condition and Midazolam  Airway Management Planned: LMA  Additional Equipment: None  Intra-op Plan:   Post-operative Plan: Extubation in OR  Informed Consent: I have reviewed the patients History and Physical, chart, labs and discussed the procedure including the risks, benefits and alternatives for the proposed anesthesia with the patient or authorized representative who has indicated his/her understanding and acceptance.     Dental advisory given  Plan Discussed with: CRNA  Anesthesia Plan Comments:       Anesthesia Quick Evaluation  Anesthesia Physical Anesthesia Plan  ASA: 4  Anesthesia Plan: MAC and Regional   Post-op Pain Management:    Induction: Intravenous  PONV Risk Score and Plan: 2 and Propofol infusion and Treatment may vary due to age or medical condition  Airway Management Planned: Nasal Cannula  Additional Equipment: None  Intra-op Plan:   Post-operative Plan:   Informed Consent: I have reviewed the patients History and Physical, chart, labs and discussed the procedure including the risks, benefits and alternatives for the proposed anesthesia with the patient or authorized representative who has indicated his/her understanding and acceptance.     Dental advisory given  Plan Discussed with: CRNA and Anesthesiologist  Anesthesia Plan Comments: (PAT note by Karoline Caldwell, PA-C: Follows with cardiology for history of HLD, HTN, CAD s/p CABG in 2005 with multiple PCI's sincewith the most recent being anNSTEMI in 12/2020 with DESx2 to SVG-RCAand recent NSTEMI in 02/2021 and found to have75% stenosis along the SVG to PDA whichwas treated with scoring balloon angioplasty and  also received DES placement to80% stenosis along the mid graft lesion.Also required intervention to her subclavian stent as it was felt this could be compromising flow into the LIMA.Last seen by cardiology 04/06/2026 discussed upcoming vascular surgery.  Per note, ". She is scheduled for right AV fistula creation later this month and we reviewed today that ASA and Plavix cannot be held for the procedure but she reports being informed that she would be able to continue the medications prior to surgery.Will forward today's note to Vascular.Would not anticipate additional cardiac testing prior to surgery given recent stent placement."  ESRD via right IJ [facility] on Tuesday Thursday Saturday.  IDDM 2, last A1c 6.9 on 02/22/2021.  Patient will need day of surgery labs and evaluation.  PFT 01/26/2020: FVC-%Pred-Pre Latest Units: % 57 FEV1-%Pred-Pre Latest Units: % 54 FEV1FVC-%Pred-Pre Latest Units: % 92 TLC % pred Latest Units: % 73 RV % pred Latest Units: % 85 DLCO unc % pred Latest Units: % 66  EKG 04/06/2021: Sinus rhythm with possible premature atrial complexes with aberrancy.  Possible LAE.  Rate 83.  ST and T wave abnormality, consider lateral ischemia.  Prolonged QT (QTC 502).  Cath 02/24/2021: . Ost LM to Dist LM lesion is 95% stenosed.  LIMA to LAD is patent. . Prox LAD to Mid LAD lesion is 100% stenosed. Known from prior catheterization. Unable to advance left catheter through the severely diseased subclavian stent. Colon Flattery LAD to Prox LAD lesion is 100% stenosed. . 1st Mrg-1 lesion is 99% stenosed. SVG to OM occluded. . 1st Mrg-2 lesion is 100% stenosed. Colon Flattery Cx to Mid Cx lesion is 99% stenosed. . Origin to Prox Graft lesion before 1st Mrg is 100% stenosed. Marland Kitchen LIMA and is normal in caliber. Ostial LIMA stent is 60% narrowed. . Prox RCA lesion is 100% stenosed. . SVG to PDA showed prox Graft lesion is 75% stenosed. Scoring balloon angioplasty was performed using a BALLOON SCOREFLEX 3.0X10.  Multipurpose guide catheter was able to track through the subclavian stent. . Post intervention, there is a 0% residual stenosis. . SVG to PDA showed mid Graft lesion is 80% stenosed. A drug-eluting stent was successfully placed using a SYNERGY XD 3.50X16. Marland Kitchen Post intervention, there is a 0% residual stenosis. . LV end diastolic pressure is moderately elevated. . There is no aortic valve stenosis. . Origin lesion is 20% stenosed. Due to the severe in-stent restenosis of the subclavian stent, Dr. Carlis Abbott was called. I suspect this was causing anterior wall ischemia due to compromising flow into the LIMA. There was a 30 mm gradient across the stent. There was clear difficulty getting equipment across the stent to the heart as noted in the report. He is planning to place a covered stent over the disease to bare-metal stent which was previously placed.  From a cardiac standpoint, continue aggressive secondary prevention. She will need dual antiplatelet therapy.  )       Anesthesia Quick Evaluation

## 2021-05-11 ENCOUNTER — Encounter (HOSPITAL_COMMUNITY): Payer: Self-pay | Admitting: Vascular Surgery

## 2021-05-11 ENCOUNTER — Ambulatory Visit (HOSPITAL_COMMUNITY)
Admission: RE | Admit: 2021-05-11 | Discharge: 2021-05-11 | Disposition: A | Discharge status: Home / Self Care | Payer: Medicare HMO | Attending: Vascular Surgery | Admitting: Vascular Surgery

## 2021-05-11 ENCOUNTER — Encounter (HOSPITAL_COMMUNITY)
Admission: RE | Disposition: A | Discharge status: Home / Self Care | Payer: Self-pay | Source: Home / Self Care | Attending: Vascular Surgery

## 2021-05-11 ENCOUNTER — Ambulatory Visit (HOSPITAL_COMMUNITY): Payer: Medicare HMO | Admitting: Physician Assistant

## 2021-05-11 ENCOUNTER — Other Ambulatory Visit: Payer: Self-pay

## 2021-05-11 DIAGNOSIS — I132 Hypertensive heart and chronic kidney disease with heart failure and with stage 5 chronic kidney disease, or end stage renal disease: Secondary | ICD-10-CM | POA: Diagnosis not present

## 2021-05-11 DIAGNOSIS — Z888 Allergy status to other drugs, medicaments and biological substances status: Secondary | ICD-10-CM | POA: Diagnosis not present

## 2021-05-11 DIAGNOSIS — Z79890 Hormone replacement therapy (postmenopausal): Secondary | ICD-10-CM | POA: Diagnosis not present

## 2021-05-11 DIAGNOSIS — I252 Old myocardial infarction: Secondary | ICD-10-CM | POA: Insufficient documentation

## 2021-05-11 DIAGNOSIS — Z79899 Other long term (current) drug therapy: Secondary | ICD-10-CM | POA: Diagnosis not present

## 2021-05-11 DIAGNOSIS — E1122 Type 2 diabetes mellitus with diabetic chronic kidney disease: Secondary | ICD-10-CM | POA: Diagnosis not present

## 2021-05-11 DIAGNOSIS — Z880 Allergy status to penicillin: Secondary | ICD-10-CM | POA: Diagnosis not present

## 2021-05-11 DIAGNOSIS — Z7902 Long term (current) use of antithrombotics/antiplatelets: Secondary | ICD-10-CM | POA: Diagnosis not present

## 2021-05-11 DIAGNOSIS — I2510 Atherosclerotic heart disease of native coronary artery without angina pectoris: Secondary | ICD-10-CM | POA: Diagnosis not present

## 2021-05-11 DIAGNOSIS — Z955 Presence of coronary angioplasty implant and graft: Secondary | ICD-10-CM | POA: Insufficient documentation

## 2021-05-11 DIAGNOSIS — I5032 Chronic diastolic (congestive) heart failure: Secondary | ICD-10-CM | POA: Diagnosis not present

## 2021-05-11 DIAGNOSIS — I120 Hypertensive chronic kidney disease with stage 5 chronic kidney disease or end stage renal disease: Secondary | ICD-10-CM | POA: Diagnosis not present

## 2021-05-11 DIAGNOSIS — Z87891 Personal history of nicotine dependence: Secondary | ICD-10-CM | POA: Insufficient documentation

## 2021-05-11 DIAGNOSIS — Z951 Presence of aortocoronary bypass graft: Secondary | ICD-10-CM | POA: Insufficient documentation

## 2021-05-11 DIAGNOSIS — I771 Stricture of artery: Secondary | ICD-10-CM

## 2021-05-11 DIAGNOSIS — N186 End stage renal disease: Secondary | ICD-10-CM

## 2021-05-11 DIAGNOSIS — E785 Hyperlipidemia, unspecified: Secondary | ICD-10-CM | POA: Insufficient documentation

## 2021-05-11 DIAGNOSIS — Z992 Dependence on renal dialysis: Secondary | ICD-10-CM | POA: Diagnosis not present

## 2021-05-11 DIAGNOSIS — Z8679 Personal history of other diseases of the circulatory system: Secondary | ICD-10-CM | POA: Diagnosis not present

## 2021-05-11 DIAGNOSIS — Z794 Long term (current) use of insulin: Secondary | ICD-10-CM | POA: Diagnosis not present

## 2021-05-11 HISTORY — PX: AV FISTULA PLACEMENT: SHX1204

## 2021-05-11 LAB — POCT I-STAT, CHEM 8
BUN: 45 mg/dL — ABNORMAL HIGH (ref 8–23)
Calcium, Ion: 0.9 mmol/L — ABNORMAL LOW (ref 1.15–1.40)
Chloride: 99 mmol/L (ref 98–111)
Creatinine, Ser: 6.2 mg/dL — ABNORMAL HIGH (ref 0.44–1.00)
Glucose, Bld: 169 mg/dL — ABNORMAL HIGH (ref 70–99)
HCT: 38 % (ref 36.0–46.0)
Hemoglobin: 12.9 g/dL (ref 12.0–15.0)
Potassium: 6.2 mmol/L — ABNORMAL HIGH (ref 3.5–5.1)
Sodium: 130 mmol/L — ABNORMAL LOW (ref 135–145)
TCO2: 25 mmol/L (ref 22–32)

## 2021-05-11 LAB — POTASSIUM: Potassium: 4 mmol/L (ref 3.5–5.1)

## 2021-05-11 LAB — GLUCOSE, CAPILLARY
Glucose-Capillary: 164 mg/dL — ABNORMAL HIGH (ref 70–99)
Glucose-Capillary: 174 mg/dL — ABNORMAL HIGH (ref 70–99)

## 2021-05-11 SURGERY — ARTERIOVENOUS (AV) FISTULA CREATION
Anesthesia: Monitor Anesthesia Care | Site: Arm Upper | Laterality: Right

## 2021-05-11 MED ORDER — SODIUM CHLORIDE 0.9 % IV SOLN
INTRAVENOUS | Status: DC | PRN
  Administered 2021-05-11: 50 ug/min via INTRAVENOUS

## 2021-05-11 MED ORDER — LIDOCAINE 2% (20 MG/ML) 5 ML SYRINGE
INTRAMUSCULAR | Status: AC
  Filled 2021-05-11: qty 5

## 2021-05-11 MED ORDER — PROPOFOL 10 MG/ML IV BOLUS
INTRAVENOUS | Status: AC
  Filled 2021-05-11: qty 20

## 2021-05-11 MED ORDER — DEXAMETHASONE SODIUM PHOSPHATE 10 MG/ML IJ SOLN
INTRAMUSCULAR | Status: AC
  Filled 2021-05-11: qty 1

## 2021-05-11 MED ORDER — SODIUM CHLORIDE 0.9 % IV SOLN: INTRAVENOUS | Status: DC

## 2021-05-11 MED ORDER — PHENYLEPHRINE HCL (PRESSORS) 10 MG/ML IV SOLN
INTRAVENOUS | Status: DC | PRN
  Administered 2021-05-11 (×2): 160 ug via INTRAVENOUS
  Administered 2021-05-11: 80 ug via INTRAVENOUS

## 2021-05-11 MED ORDER — ONDANSETRON HCL 4 MG/2ML IJ SOLN
INTRAMUSCULAR | Status: AC
  Filled 2021-05-11: qty 2

## 2021-05-11 MED ORDER — 0.9 % SODIUM CHLORIDE (POUR BTL) OPTIME
TOPICAL | Status: DC | PRN
  Administered 2021-05-11: 1000 mL

## 2021-05-11 MED ORDER — VANCOMYCIN HCL IN DEXTROSE 1-5 GM/200ML-% IV SOLN
1000.0000 mg | INTRAVENOUS | Status: AC
  Administered 2021-05-11: 1000 mg via INTRAVENOUS
  Filled 2021-05-11: qty 200

## 2021-05-11 MED ORDER — OXYCODONE-ACETAMINOPHEN 5-325 MG PO TABS: 1.0000 | ORAL_TABLET | Freq: Four times a day (QID) | ORAL | 0 refills | Status: DC | PRN

## 2021-05-11 MED ORDER — ROCURONIUM BROMIDE 10 MG/ML (PF) SYRINGE
PREFILLED_SYRINGE | INTRAVENOUS | Status: AC
  Filled 2021-05-11: qty 10

## 2021-05-11 MED ORDER — MIDAZOLAM HCL 2 MG/2ML IJ SOLN
INTRAMUSCULAR | Status: AC
  Filled 2021-05-11: qty 2

## 2021-05-11 MED ORDER — CHLORHEXIDINE GLUCONATE 4 % EX LIQD: 60.0000 mL | Freq: Once | CUTANEOUS | Status: DC

## 2021-05-11 MED ORDER — PROPOFOL 10 MG/ML IV BOLUS
INTRAVENOUS | Status: DC | PRN
  Administered 2021-05-11: 25 ug/kg/min via INTRAVENOUS

## 2021-05-11 MED ORDER — FENTANYL CITRATE (PF) 100 MCG/2ML IJ SOLN
INTRAMUSCULAR | Status: DC | PRN
  Administered 2021-05-11 (×2): 50 ug via INTRAVENOUS

## 2021-05-11 MED ORDER — LIDOCAINE-EPINEPHRINE (PF) 1 %-1:200000 IJ SOLN
INTRAMUSCULAR | Status: AC
  Filled 2021-05-11: qty 30

## 2021-05-11 MED ORDER — ONDANSETRON HCL 4 MG/2ML IJ SOLN
INTRAMUSCULAR | Status: DC | PRN
  Administered 2021-05-11: 4 mg via INTRAVENOUS

## 2021-05-11 MED ORDER — ORAL CARE MOUTH RINSE: 15.0000 mL | Freq: Once | OROMUCOSAL | Status: AC

## 2021-05-11 MED ORDER — MIDAZOLAM HCL 5 MG/5ML IJ SOLN
INTRAMUSCULAR | Status: DC | PRN
  Administered 2021-05-11 (×2): 1 mg via INTRAVENOUS

## 2021-05-11 MED ORDER — HEPARIN 6000 UNIT IRRIGATION SOLUTION
Status: AC
  Filled 2021-05-11: qty 500

## 2021-05-11 MED ORDER — FENTANYL CITRATE (PF) 100 MCG/2ML IJ SOLN
INTRAMUSCULAR | Status: AC
  Filled 2021-05-11: qty 2

## 2021-05-11 MED ORDER — CHLORHEXIDINE GLUCONATE 0.12 % MT SOLN
15.0000 mL | Freq: Once | OROMUCOSAL | Status: AC
  Administered 2021-05-11: 15 mL via OROMUCOSAL
  Filled 2021-05-11: qty 15

## 2021-05-11 MED ORDER — HEPARIN 6000 UNIT IRRIGATION SOLUTION
Status: DC | PRN
  Administered 2021-05-11: 1

## 2021-05-11 SURGICAL SUPPLY — 30 items
ARMBAND PINK RESTRICT EXTREMIT (MISCELLANEOUS) ×2 IMPLANT
BAG COUNTER SPONGE SURGICOUNT (BAG) ×2 IMPLANT
CANISTER SUCT 3000ML PPV (MISCELLANEOUS) ×2 IMPLANT
CLIP LIGATING EXTRA MED SLVR (CLIP) ×2 IMPLANT
CLIP LIGATING EXTRA SM BLUE (MISCELLANEOUS) ×2 IMPLANT
CLIP VESOCCLUDE MED 6/CT (CLIP) IMPLANT
CLIP VESOCCLUDE SM WIDE 6/CT (CLIP) IMPLANT
COVER PROBE W GEL 5X96 (DRAPES) IMPLANT
DERMABOND ADVANCED (GAUZE/BANDAGES/DRESSINGS) ×1
DERMABOND ADVANCED .7 DNX12 (GAUZE/BANDAGES/DRESSINGS) ×1 IMPLANT
ELECT REM PT RETURN 9FT ADLT (ELECTROSURGICAL) ×2
ELECTRODE REM PT RTRN 9FT ADLT (ELECTROSURGICAL) ×1 IMPLANT
GLOVE SURG ENC MOIS LTX SZ7.5 (GLOVE) ×2 IMPLANT
GOWN STRL REUS W/ TWL LRG LVL3 (GOWN DISPOSABLE) ×2 IMPLANT
GOWN STRL REUS W/ TWL XL LVL3 (GOWN DISPOSABLE) ×1 IMPLANT
GOWN STRL REUS W/TWL LRG LVL3 (GOWN DISPOSABLE) ×4
GOWN STRL REUS W/TWL XL LVL3 (GOWN DISPOSABLE) ×2
INSERT FOGARTY SM (MISCELLANEOUS) IMPLANT
KIT BASIN OR (CUSTOM PROCEDURE TRAY) ×2 IMPLANT
KIT TURNOVER KIT B (KITS) ×2 IMPLANT
NS IRRIG 1000ML POUR BTL (IV SOLUTION) ×2 IMPLANT
PACK CV ACCESS (CUSTOM PROCEDURE TRAY) ×2 IMPLANT
PAD ARMBOARD 7.5X6 YLW CONV (MISCELLANEOUS) ×4 IMPLANT
SUT MNCRL AB 4-0 PS2 18 (SUTURE) ×2 IMPLANT
SUT PROLENE 6 0 BV (SUTURE) ×2 IMPLANT
SUT VIC AB 3-0 SH 27 (SUTURE) ×2
SUT VIC AB 3-0 SH 27X BRD (SUTURE) ×1 IMPLANT
TOWEL GREEN STERILE (TOWEL DISPOSABLE) ×2 IMPLANT
UNDERPAD 30X36 HEAVY ABSORB (UNDERPADS AND DIAPERS) ×2 IMPLANT
WATER STERILE IRR 1000ML POUR (IV SOLUTION) ×2 IMPLANT

## 2021-05-11 NOTE — Progress Notes (Signed)
Notified Dr. Ermalene Postin that pt's potassium is 4.0.  No new orders.

## 2021-05-11 NOTE — Anesthesia Procedure Notes (Signed)
Procedure Name: MAC Date/Time: 05/11/2021 8:43 AM Performed by: Annamary Carolin, CRNA Pre-anesthesia Checklist: Emergency Drugs available, Patient identified, Suction available and Patient being monitored Patient Re-evaluated:Patient Re-evaluated prior to induction Preoxygenation: Pre-oxygenation with 100% oxygen Induction Type: IV induction Dental Injury: Teeth and Oropharynx as per pre-operative assessment

## 2021-05-11 NOTE — Progress Notes (Signed)
Patient's K+ 6.2 on Istat.  Dr. Ermalene Postin made aware.  Received order to draw stat potassium.

## 2021-05-11 NOTE — Discharge Instructions (Signed)
[facility]  Discharge Instructions  AV Fistula or Graft Surgery for Dialysis Access  Please refer to the following instructions for your post-procedure care. Your surgeon or physician assistant will discuss any changes with you.  Activity  You may drive the day following your surgery, if you are comfortable and no longer taking prescription pain medication. Resume full activity as the soreness in your incision resolves.  Bathing/Showering  You may shower after you go home. Keep your incision dry for 48 hours. Do not soak in a bathtub, hot tub, or swim until the incision heals completely. You may not shower if you have a hemodialysis catheter.  Incision Care  Clean your incision with mild soap and water after 48 hours. Pat the area dry with a clean towel. You do not need a bandage unless otherwise instructed. Do not apply any ointments or creams to your incision. You may have skin glue on your incision. Do not peel it off. It will come off on its own in about one week. Your arm may swell a bit after surgery. To reduce swelling use pillows to elevate your arm so it is above your heart. Your doctor will tell you if you need to lightly wrap your arm with an ACE bandage.  Diet  Resume your normal diet. There are not special food restrictions following this procedure. In order to heal from your surgery, it is CRITICAL to get adequate nutrition. Your body requires vitamins, minerals, and protein. Vegetables are the best source of vitamins and minerals. Vegetables also provide the perfect balance of protein. Processed food has little nutritional value, so try to avoid this.  Medications  Resume taking all of your medications. If your incision is causing pain, you may take over-the counter pain relievers such as acetaminophen (Tylenol). If you were prescribed a stronger pain medication, please be aware these medications can cause nausea and constipation. Prevent  nausea by taking the medication with a snack or meal. Avoid constipation by drinking plenty of fluids and eating foods with high amount of fiber, such as fruits, vegetables, and grains.  Do not take Tylenol if you are taking prescription pain medications.  Follow up Your surgeon may want to see you in the office following your access surgery. If so, this will be arranged at the time of your surgery.  Please call us immediately for any of the following conditions:  Increased pain, redness, drainage (pus) from your incision site Fever of 101 degrees or higher Severe or worsening pain at your incision site Hand pain or numbness.  Reduce your risk of vascular disease:  Stop smoking. If you would like help, call [facility] at 1-800-QUIT-NOW (346)675-2303) or [place] at [place] your cholesterol Maintain a desired weight Control your diabetes Keep your blood pressure down  Dialysis  It will take several weeks to several months for your new dialysis access to be ready for use. Your surgeon will determine when it is okay to use it. Your nephrologist will continue to direct your dialysis. You can continue to use your Permcath until your new access is ready for use.   05/11/2021 Donna Howe [id number] 07/13/1956  Surgeon(s): Waynetta Sandy, MD  Procedure(s): RIGHT BRACHIOCEPHALIC ARTERIOVENOUS (AV) FISTULA CREATION   May stick graft immediately   May stick graft on designated area only:   X Do not stick right AV fistula for 12 weeks    If you have any questions, please call the office  at [phone].

## 2021-05-11 NOTE — Progress Notes (Signed)
Spoke with Dr. Donzetta Matters. He is aware that pt took Plavix and Aspirin this morning. Also informed him that pt's K+ is 6.2 and another K+ is being drawn.

## 2021-05-11 NOTE — Progress Notes (Signed)
Orthopedic Tech Progress Note Patient Details:  Donna Howe [birth year] [id number]  PACU RN called requesting an ARM SLING for patient. RN stated she would apply ARM SLING when she get patient dressed    Ortho Devices Type of Ortho Device: Arm sling Ortho Device/Splint Location: RUE Ortho Device/Splint Interventions: Ordered, Other (comment)   Post Interventions Patient Tolerated: Other (comment) Instructions Provided: Other (comment)  [name] 05/11/2021, 10:28 AM

## 2021-05-11 NOTE — Transfer of Care (Signed)
Immediate Anesthesia Transfer of Care Note  Patient: Donna Howe  Procedure(s) Performed: RIGHT BRACHIOCEPHALIC ARTERIOVENOUS (AV) FISTULA CREATION (Right: Arm Upper)  Patient Location: PACU  Anesthesia Type:MAC and Regional  Level of Consciousness: awake, alert , oriented and patient cooperative  Airway & Oxygen Therapy: Patient Spontanous Breathing  Post-op Assessment: Report given to RN, Post -op Vital signs reviewed and stable and Patient moving all extremities X 4  Post vital signs: Reviewed and stable  Last Vitals:  Vitals Value Taken Time  BP 100/49 05/11/21 0929  Temp    Pulse 68 05/11/21 0934  Resp 17 05/11/21 0934  SpO2 97 % 05/11/21 0934  Vitals shown include unvalidated device data.  Last Pain:  Vitals:   05/11/21 4739  TempSrc:   PainSc: 0-No pain         Complications: No notable events documented.

## 2021-05-11 NOTE — Op Note (Signed)
    Patient name: Donna Howe MRN: [id number] DOB: 1955/12/01 Sex: female  05/11/2021 Pre-operative Diagnosis: esrd Post-operative diagnosis:  Same Surgeon:  Erlene Quan C. Donzetta Matters, MD Assistant: Paulo Fruit PA Procedure Performed: Right brachial artery cephalic vein AV fistula creation  Indications: 65 year old female with history of end-stage renal disease.  She is now dialyzing via catheter.  She has history of steal on the left upper extremity required left subclavian artery stenting for LIMA graft and ligation of her fistula.  She is now to care for right upper extremity access.  Findings: The cephalic vein at the antecubital measured approximately 3-1/2 mm.  Was more diminutive higher in the arm but given her previous steal issues on the left I elected for fistula creation.  There was no suitable basilic vein by ultrasound.  At completion there was a very strong thrill in the cephalic vein.  There was good signal distally in the radial artery.   Procedure:  The patient was identified in the holding area and taken to the operating where she is placed supine on the operative table and MAC anesthesia was induced.  She was sterilely prepped draped in the right upper extremity usual fashion, antibiotics were administered and timeout was called.  Her preoperative block was checked noted to be intact.  Ultrasound identified a suitable cephalic vein at the antecubitum.  Transverse incision was made to resect out the vein for several centimeters.  It was marked for orientation.  We dissected through the deep fascia identified the brachial artery placed a vessel loop around this.  We then transected the vein distally tied it off.  We spatulated the left heparinized saline clamped.  The artery stump is approximate open longitudinally flushed with heparinized saline the distal direction only given the small size.  Names and sewn inside of 6-0 Prolene suture.  Prior completion without flushing all directions.   Point patient was very strong thrill in the vein.  We had good distal signal in the radial artery.  Satisfied with this we irrigated and obtained stasis.  We closed in layers of Vicryl and Monocryl.  Dermabond was placed on the skin.  She was awakened from anesthesia having tolerated procedure without any complication.  All counts were correct at completion.    EBL: 50cc  Twanna Resh C. Donzetta Matters, MD [facility] [place] Office: [phone] Pager: [phone]

## 2021-05-11 NOTE — Interval H&P Note (Signed)
History and Physical Interval Note:  05/11/2021 7:23 AM  Donna Howe  has presented today for surgery, with the diagnosis of ESRD.  The various methods of treatment have been discussed with the patient and family. After consideration of risks, benefits and other options for treatment, the patient has consented to  Procedure(s): RIGHT ARTERIOVENOUS (AV) FISTULA CREATION VERSUS GRAFT (Right) as a surgical intervention.  The patient's history has been reviewed, patient examined, no change in status, stable for surgery.  I have reviewed the patient's chart and labs.  Questions were answered to the patient's satisfaction.     Servando Snare

## 2021-05-12 ENCOUNTER — Encounter (HOSPITAL_COMMUNITY): Payer: Self-pay | Admitting: Vascular Surgery

## 2021-05-14 DIAGNOSIS — N2581 Secondary hyperparathyroidism of renal origin: Secondary | ICD-10-CM | POA: Diagnosis not present

## 2021-05-14 DIAGNOSIS — N186 End stage renal disease: Secondary | ICD-10-CM | POA: Diagnosis not present

## 2021-05-14 DIAGNOSIS — D509 Iron deficiency anemia, unspecified: Secondary | ICD-10-CM | POA: Diagnosis not present

## 2021-05-14 DIAGNOSIS — D689 Coagulation defect, unspecified: Secondary | ICD-10-CM | POA: Diagnosis not present

## 2021-05-14 DIAGNOSIS — Z992 Dependence on renal dialysis: Secondary | ICD-10-CM | POA: Diagnosis not present

## 2021-05-16 DIAGNOSIS — D689 Coagulation defect, unspecified: Secondary | ICD-10-CM | POA: Diagnosis not present

## 2021-05-16 DIAGNOSIS — D509 Iron deficiency anemia, unspecified: Secondary | ICD-10-CM | POA: Diagnosis not present

## 2021-05-16 DIAGNOSIS — N186 End stage renal disease: Secondary | ICD-10-CM | POA: Diagnosis not present

## 2021-05-16 DIAGNOSIS — N2581 Secondary hyperparathyroidism of renal origin: Secondary | ICD-10-CM | POA: Diagnosis not present

## 2021-05-16 DIAGNOSIS — Z992 Dependence on renal dialysis: Secondary | ICD-10-CM | POA: Diagnosis not present

## 2021-05-16 DIAGNOSIS — E8779 Other fluid overload: Secondary | ICD-10-CM | POA: Diagnosis not present

## 2021-05-17 ENCOUNTER — Encounter (HOSPITAL_COMMUNITY): Payer: Self-pay | Admitting: Vascular Surgery

## 2021-05-17 DIAGNOSIS — D689 Coagulation defect, unspecified: Secondary | ICD-10-CM | POA: Diagnosis not present

## 2021-05-17 DIAGNOSIS — E8779 Other fluid overload: Secondary | ICD-10-CM | POA: Diagnosis not present

## 2021-05-17 DIAGNOSIS — N2581 Secondary hyperparathyroidism of renal origin: Secondary | ICD-10-CM | POA: Diagnosis not present

## 2021-05-17 DIAGNOSIS — N186 End stage renal disease: Secondary | ICD-10-CM | POA: Diagnosis not present

## 2021-05-17 DIAGNOSIS — D509 Iron deficiency anemia, unspecified: Secondary | ICD-10-CM | POA: Diagnosis not present

## 2021-05-17 DIAGNOSIS — Z992 Dependence on renal dialysis: Secondary | ICD-10-CM | POA: Diagnosis not present

## 2021-05-17 MED ORDER — LIDOCAINE-EPINEPHRINE 2 %-1:100000 IJ SOLN
INTRAMUSCULAR | Status: DC | PRN
Start: 2021-05-11 — End: 2021-05-17
  Administered 2021-05-11: 20 mL via PERINEURAL

## 2021-05-17 MED ORDER — MEPIVACAINE HCL (PF) 1 % IJ SOLN
INTRAMUSCULAR | Status: DC | PRN
  Administered 2021-05-11: 10 mL via PERINEURAL

## 2021-05-17 NOTE — Anesthesia Procedure Notes (Signed)
Anesthesia Regional Block: Supraclavicular block   Pre-Anesthetic Checklist: , timeout performed,  Correct Patient, Correct Site, Correct Laterality,  Correct Procedure, Correct Position, site marked,  Risks and benefits discussed,  Surgical consent,  Pre-op evaluation,  At surgeon's request and post-op pain management  Laterality: Right and Upper  Prep: chloraprep       Needles:  Injection technique: Single-shot      Needle Length: 5cm  Needle Gauge: 22     Additional Needles: Arrow StimuQuik ECHO Echogenic Stimulating PNB Needle  Procedures:,,,, ultrasound used (permanent image in chart),,    Narrative:  Start time: 05/11/2021 8:00 AM End time: 05/11/2021 8:07 AM Injection made incrementally with aspirations every 5 mL.  Performed by: Personally  Anesthesiologist: Oleta Mouse, MD

## 2021-05-17 NOTE — Anesthesia Postprocedure Evaluation (Signed)
Anesthesia Post Note  Patient: Donna Howe  Procedure(s) Performed: RIGHT BRACHIOCEPHALIC ARTERIOVENOUS (AV) FISTULA CREATION (Right: Arm Upper)     Patient location during evaluation: PACU Anesthesia Type: Regional and MAC Level of consciousness: awake and alert Pain management: pain level controlled Vital Signs Assessment: post-procedure vital signs reviewed and stable Respiratory status: spontaneous breathing, nonlabored ventilation, respiratory function stable and patient connected to nasal cannula oxygen Cardiovascular status: stable and blood pressure returned to baseline Postop Assessment: no apparent nausea or vomiting Anesthetic complications: no   No notable events documented.  Last Vitals:  Vitals:   05/11/21 1030 05/11/21 1115  BP: (!) 103/46 118/66  Pulse: 71 81  Resp: 15 15  Temp:  36.6 C  SpO2: 94% 94%    Last Pain:  Vitals:   05/11/21 1115  TempSrc:   PainSc: 0-No pain                 Delorus Langwell

## 2021-05-18 DIAGNOSIS — N186 End stage renal disease: Secondary | ICD-10-CM | POA: Diagnosis not present

## 2021-05-18 DIAGNOSIS — E1122 Type 2 diabetes mellitus with diabetic chronic kidney disease: Secondary | ICD-10-CM | POA: Diagnosis not present

## 2021-05-18 DIAGNOSIS — Z992 Dependence on renal dialysis: Secondary | ICD-10-CM | POA: Diagnosis not present

## 2021-05-19 DIAGNOSIS — N186 End stage renal disease: Secondary | ICD-10-CM | POA: Diagnosis not present

## 2021-05-19 DIAGNOSIS — N2581 Secondary hyperparathyroidism of renal origin: Secondary | ICD-10-CM | POA: Diagnosis not present

## 2021-05-19 DIAGNOSIS — D509 Iron deficiency anemia, unspecified: Secondary | ICD-10-CM | POA: Diagnosis not present

## 2021-05-19 DIAGNOSIS — D689 Coagulation defect, unspecified: Secondary | ICD-10-CM | POA: Diagnosis not present

## 2021-05-19 DIAGNOSIS — Z992 Dependence on renal dialysis: Secondary | ICD-10-CM | POA: Diagnosis not present

## 2021-05-21 DIAGNOSIS — N186 End stage renal disease: Secondary | ICD-10-CM | POA: Diagnosis not present

## 2021-05-21 DIAGNOSIS — N2581 Secondary hyperparathyroidism of renal origin: Secondary | ICD-10-CM | POA: Diagnosis not present

## 2021-05-21 DIAGNOSIS — D509 Iron deficiency anemia, unspecified: Secondary | ICD-10-CM | POA: Diagnosis not present

## 2021-05-21 DIAGNOSIS — D689 Coagulation defect, unspecified: Secondary | ICD-10-CM | POA: Diagnosis not present

## 2021-05-21 DIAGNOSIS — Z992 Dependence on renal dialysis: Secondary | ICD-10-CM | POA: Diagnosis not present

## 2021-05-24 DIAGNOSIS — N2581 Secondary hyperparathyroidism of renal origin: Secondary | ICD-10-CM | POA: Diagnosis not present

## 2021-05-24 DIAGNOSIS — N186 End stage renal disease: Secondary | ICD-10-CM | POA: Diagnosis not present

## 2021-05-24 DIAGNOSIS — D689 Coagulation defect, unspecified: Secondary | ICD-10-CM | POA: Diagnosis not present

## 2021-05-24 DIAGNOSIS — D509 Iron deficiency anemia, unspecified: Secondary | ICD-10-CM | POA: Diagnosis not present

## 2021-05-24 DIAGNOSIS — Z992 Dependence on renal dialysis: Secondary | ICD-10-CM | POA: Diagnosis not present

## 2021-05-26 DIAGNOSIS — N2581 Secondary hyperparathyroidism of renal origin: Secondary | ICD-10-CM | POA: Diagnosis not present

## 2021-05-26 DIAGNOSIS — Z992 Dependence on renal dialysis: Secondary | ICD-10-CM | POA: Diagnosis not present

## 2021-05-26 DIAGNOSIS — N186 End stage renal disease: Secondary | ICD-10-CM | POA: Diagnosis not present

## 2021-05-26 DIAGNOSIS — D689 Coagulation defect, unspecified: Secondary | ICD-10-CM | POA: Diagnosis not present

## 2021-05-26 DIAGNOSIS — D509 Iron deficiency anemia, unspecified: Secondary | ICD-10-CM | POA: Diagnosis not present

## 2021-05-28 ENCOUNTER — Emergency Department (HOSPITAL_COMMUNITY)
Admission: EM | Admit: 2021-05-28 | Discharge: 2021-05-28 | Disposition: A | Discharge status: Home / Self Care | Payer: Medicare HMO | Attending: Emergency Medicine | Admitting: Emergency Medicine

## 2021-05-28 ENCOUNTER — Emergency Department (HOSPITAL_COMMUNITY): Payer: Medicare HMO

## 2021-05-28 ENCOUNTER — Encounter (HOSPITAL_COMMUNITY): Payer: Self-pay

## 2021-05-28 ENCOUNTER — Other Ambulatory Visit: Payer: Self-pay

## 2021-05-28 DIAGNOSIS — Z87891 Personal history of nicotine dependence: Secondary | ICD-10-CM | POA: Diagnosis not present

## 2021-05-28 DIAGNOSIS — E1122 Type 2 diabetes mellitus with diabetic chronic kidney disease: Secondary | ICD-10-CM | POA: Insufficient documentation

## 2021-05-28 DIAGNOSIS — Z992 Dependence on renal dialysis: Secondary | ICD-10-CM | POA: Insufficient documentation

## 2021-05-28 DIAGNOSIS — M549 Dorsalgia, unspecified: Secondary | ICD-10-CM | POA: Insufficient documentation

## 2021-05-28 DIAGNOSIS — J45909 Unspecified asthma, uncomplicated: Secondary | ICD-10-CM | POA: Insufficient documentation

## 2021-05-28 DIAGNOSIS — Z7902 Long term (current) use of antithrombotics/antiplatelets: Secondary | ICD-10-CM | POA: Diagnosis not present

## 2021-05-28 DIAGNOSIS — E1140 Type 2 diabetes mellitus with diabetic neuropathy, unspecified: Secondary | ICD-10-CM | POA: Diagnosis not present

## 2021-05-28 DIAGNOSIS — E039 Hypothyroidism, unspecified: Secondary | ICD-10-CM | POA: Insufficient documentation

## 2021-05-28 DIAGNOSIS — I25110 Atherosclerotic heart disease of native coronary artery with unstable angina pectoris: Secondary | ICD-10-CM | POA: Diagnosis not present

## 2021-05-28 DIAGNOSIS — N186 End stage renal disease: Secondary | ICD-10-CM | POA: Diagnosis not present

## 2021-05-28 DIAGNOSIS — R079 Chest pain, unspecified: Secondary | ICD-10-CM

## 2021-05-28 DIAGNOSIS — Z79899 Other long term (current) drug therapy: Secondary | ICD-10-CM | POA: Insufficient documentation

## 2021-05-28 DIAGNOSIS — Z7982 Long term (current) use of aspirin: Secondary | ICD-10-CM | POA: Diagnosis not present

## 2021-05-28 DIAGNOSIS — I5032 Chronic diastolic (congestive) heart failure: Secondary | ICD-10-CM | POA: Diagnosis not present

## 2021-05-28 DIAGNOSIS — R0602 Shortness of breath: Secondary | ICD-10-CM | POA: Diagnosis not present

## 2021-05-28 DIAGNOSIS — R110 Nausea: Secondary | ICD-10-CM | POA: Diagnosis not present

## 2021-05-28 DIAGNOSIS — Z951 Presence of aortocoronary bypass graft: Secondary | ICD-10-CM | POA: Diagnosis not present

## 2021-05-28 DIAGNOSIS — R0789 Other chest pain: Secondary | ICD-10-CM | POA: Diagnosis not present

## 2021-05-28 DIAGNOSIS — I132 Hypertensive heart and chronic kidney disease with heart failure and with stage 5 chronic kidney disease, or end stage renal disease: Secondary | ICD-10-CM | POA: Diagnosis not present

## 2021-05-28 DIAGNOSIS — Z794 Long term (current) use of insulin: Secondary | ICD-10-CM | POA: Insufficient documentation

## 2021-05-28 LAB — BASIC METABOLIC PANEL
Anion gap: 15 (ref 5–15)
BUN: 45 mg/dL — ABNORMAL HIGH (ref 8–23)
CO2: 23 mmol/L (ref 22–32)
Calcium: 9.3 mg/dL (ref 8.9–10.3)
Chloride: 94 mmol/L — ABNORMAL LOW (ref 98–111)
Creatinine, Ser: 6.66 mg/dL — ABNORMAL HIGH (ref 0.44–1.00)
GFR, Estimated: 7 mL/min — ABNORMAL LOW (ref >60)
Glucose, Bld: 260 mg/dL — ABNORMAL HIGH (ref 70–99)
Potassium: 4.5 mmol/L (ref 3.5–5.1)
Sodium: 132 mmol/L — ABNORMAL LOW (ref 135–145)

## 2021-05-28 LAB — TROPONIN I (HIGH SENSITIVITY)
Troponin I (High Sensitivity): 18 ng/L — ABNORMAL HIGH (ref <18)
Troponin I (High Sensitivity): 19 ng/L — ABNORMAL HIGH (ref <18)

## 2021-05-28 LAB — CBC
HCT: 44.4 % (ref 36.0–46.0)
Hemoglobin: 13.8 g/dL (ref 12.0–15.0)
MCH: 33 pg (ref 26.0–34.0)
MCHC: 31.1 g/dL (ref 30.0–36.0)
MCV: 106.2 fL — ABNORMAL HIGH (ref 80.0–100.0)
Platelets: 191 10*3/uL (ref 150–400)
RBC: 4.18 MIL/uL (ref 3.87–5.11)
RDW: 14.4 % (ref 11.5–15.5)
WBC: 10.6 10*3/uL — ABNORMAL HIGH (ref 4.0–10.5)
nRBC: 0 % (ref 0.0–0.2)

## 2021-05-28 LAB — CBG MONITORING, ED: Glucose-Capillary: 226 mg/dL — ABNORMAL HIGH (ref 70–99)

## 2021-05-28 LAB — BRAIN NATRIURETIC PEPTIDE: B Natriuretic Peptide: 205 pg/mL — ABNORMAL HIGH (ref 0.0–100.0)

## 2021-05-28 NOTE — ED Provider Notes (Signed)
[facility] EMERGENCY DEPARTMENT Provider Note   CSN: [id number] Arrival date & time: 05/28/21  1027     History Chief Complaint  Patient presents with   Chest Pain    Donna Howe is a 65 y.o. female.  with significant past medical history including coronary artery disease s/p four-vessel CABG in 2005, carotid artery disease s/p carotid endarterectomy, recent NSTEMI 02/2021, ESRD on hemodialysis on Tuesday Thursday Saturday, diastolic heart failure who presents to the emergency department complaining of chest pain.  States that her chest pain began this morning around 4:30 AM.  States that she took nitroglycerin SL with brief relief.  Since then has had recurrent chest pain with 2 additional doses of sublingual nitro.  States that she took her regular 81 mg aspirin PTA.  She describes the pain as pressure on her chest and mild back pain.  Endorses shortness of breath with the chest pain.  Denies nausea, vomiting, diaphoresis, lightheadedness, dizziness.  Denies palpitations.  Denies radiation of the chest pain down her arm or jaw.   Chest Pain Associated symptoms: nausea and shortness of breath   Associated symptoms: no cough, no diaphoresis, no fever, no palpitations and no vomiting    Past Medical History:  Diagnosis Date   Anemia    Anxiety    Asthma    CAD (coronary artery disease)    Multivessel s/p CABG 2005, numerous PCIs since that time and documented graft disease   Carotid artery disease ([place])    R CEA   ESRD on hemodialysis ([place])    Essential hypertension    Gout    History of blood transfusion    Hyperlipidemia    Hypothyroidism    Myocardial infarction [facility])    PAD (peripheral artery disease) ([place])    Dr. Kellie Simmering   Pneumonia 09/2019, 11/2019   S/P angioplasty with stent- DES to mRCA and to LIMA to LAD with DES 04/09/18.   04/10/2018   SBO (small bowel obstruction) ([place]) 2011   Status post lysis of adhesions & hernia repair   Sinus bradycardia    Type 2  diabetes mellitus ([place])    Umbilical hernia     Patient Active Problem List   Diagnosis Date Noted   Stenosis of left subclavian artery ([place])    Leukocytosis 01/09/2021   Elevated MCV 01/09/2021   Diabetic neuropathy ([place]) 01/09/2021   Unspecified protein-calorie malnutrition ([place]) 07/05/2020   Allergy, unspecified, initial encounter 06/30/2020   Anaphylactic shock, unspecified, initial encounter 06/30/2020   Anemia in chronic kidney disease 06/30/2020   Coagulation defect, unspecified ([place]) 06/30/2020   Diarrhea, unspecified 06/30/2020   Encounter for immunization 06/30/2020   Iron deficiency anemia, unspecified 06/30/2020   Pain, unspecified 06/30/2020   PAD (peripheral artery disease) ([place]) 06/30/2020   Pruritus, unspecified 06/30/2020   Secondary hyperparathyroidism of renal origin ([place]) 06/30/2020   ESRD on dialysis ([place])    SOB (shortness of breath)    Chronic diastolic HF (heart failure) (HCC)    GERD (gastroesophageal reflux disease)    Acute renal failure superimposed on stage 5 chronic kidney disease, not on chronic dialysis ([place]) 06/13/2020   Uncontrolled type 2 diabetes mellitus with hyperglycemia, with long-term current use of insulin ([place]) 06/13/2020   Pneumonia 12/17/2019   Cough variant asthma with component of UACS 11/20/2019   Elevated troponin I level 10/02/2019   Chest pain 10/01/2019   Unstable angina ([place]) 04/18/2018   Hypothyroidism 04/18/2018   S/P angioplasty with stent- DES to [facility] and  to LIMA to LAD with DES 04/09/18.   04/10/2018   Angina pectoris ([place]) 04/05/2018   Status post coronary artery stent placement    Acute coronary syndrome ([place]) 05/31/2017   Acute chest pain    NSTEMI (non-ST elevated myocardial infarction) ([place])    History of coronary artery disease    Diabetes mellitus with ESRD (end-stage renal disease) ([place]) 10/28/2013   Hypoglycemia associated with diabetes ([place]) 10/28/2013   Thrombocytopenia, unspecified ([place]) 10/28/2013    Hypokalemia 10/27/2013   Syncope 10/26/2013   Anemia 10/26/2013   AKI (acute kidney injury) ([place]) 10/26/2013   Fracture of toe of right foot 19/41/7408   Umbilical hernia    Carotid artery disease ([place])    Occlusion and stenosis of carotid artery without mention of cerebral infarction 04/15/2013   Hx of CABG    Ejection fraction    Atherosclerosis of native artery of extremity with intermittent claudication ([place]) 02/11/2013   Diabetes mellitus with renal manifestation ([place]) 01/21/2013   Bradycardia 01/03/2013   Chronic kidney disease (CKD), stage III (moderate) (HCC)    Gout    PROTEINURIA, MILD 01/18/2010   Obesity (BMI 30-39.9) 08/26/2009   Asthma 08/26/2009   Hyperlipidemia 03/31/2007   Essential hypertension 03/31/2007   CAD (coronary artery disease) 03/31/2007    Past Surgical History:  Procedure Laterality Date   AORTIC ARCH ANGIOGRAPHY N/A 08/02/2020   Procedure: AORTIC ARCH ANGIOGRAPHY;  Surgeon: Waynetta Sandy, MD;  Location: [place] CV LAB;  Service: Cardiovascular;  Laterality: N/A;  Lt upper extermity   AV FISTULA PLACEMENT Left 06/29/2020   Procedure: LEFT ARM ARTERIOVENOUS (AV) FISTULA;  Surgeon: Waynetta Sandy, MD;  Location: [place];  Service: Vascular;  Laterality: Left;  ARM   AV FISTULA PLACEMENT Right 05/11/2021   Procedure: RIGHT BRACHIOCEPHALIC ARTERIOVENOUS (AV) FISTULA CREATION;  Surgeon: Waynetta Sandy, MD;  Location[facility] OR;  Service: Vascular;  Laterality: Right;   [place]  2010   CORONARY ARTERY BYPASS GRAFT  2005   CORONARY BALLOON ANGIOPLASTY N/A 05/31/2017   Procedure: CORONARY BALLOON ANGIOPLASTY;  Surgeon: Jettie Booze, MD;  Location: [place] CV LAB;  Service: Cardiovascular;  Laterality: N/A;   CORONARY STENT INTERVENTION N/A 05/31/2017   Procedure: CORONARY STENT INTERVENTION;  Surgeon: Jettie Booze, MD;  Location: [place] CV LAB;  Service: Cardiovascular;   Laterality: N/A;   CORONARY STENT INTERVENTION N/A 04/09/2018   Procedure: CORONARY STENT INTERVENTION;  Surgeon: Jettie Booze, MD;  Location: [place] CV LAB;  Service: Cardiovascular;  Laterality: N/A;  SVG RCA   CORONARY STENT INTERVENTION N/A 01/10/2021   Procedure: CORONARY STENT INTERVENTION;  Surgeon: Leonie Man, MD;  Location: [place] [facility] LAB;  Service: Cardiovascular;  Laterality: N/A;   CORONARY/GRAFT ACUTE MI REVASCULARIZATION N/A 02/24/2021   Procedure: Coronary/Graft Acute MI Revascularization;  Surgeon: Jettie Booze, MD;  Location: [place] [facility] LAB;  Service: Cardiovascular;  Laterality: N/A;   ENDARTERECTOMY Right 04/18/2013   Procedure: ENDARTERECTOMY CAROTID;  Surgeon: Mal Misty, MD;  Location: [place];  Service: Vascular;  Laterality: Right;   HERNIA REPAIR  1989   Incisional hernia repair x2  03/04/2010   Laparoscopic with 35cm mesh by Dr Ronnald Collum   IR [place] CV LINE RIGHT  06/21/2020   IR US GUIDE VASC ACCESS RIGHT  06/21/2020   LEFT HEART CATH AND CORS/GRAFTS ANGIOGRAPHY N/A 05/31/2017   Procedure: LEFT HEART CATH AND CORS/GRAFTS ANGIOGRAPHY;  Surgeon:  Jettie Booze, MD;  Location: [place] CV LAB;  Service: Cardiovascular;  Laterality: N/A;   LEFT HEART CATH AND CORS/GRAFTS ANGIOGRAPHY N/A 04/08/2018   Procedure: LEFT HEART CATH AND CORS/GRAFTS ANGIOGRAPHY;  Surgeon: Jettie Booze, MD;  Location: [place] [facility] LAB;  Service: Cardiovascular;  Laterality: N/A;   LEFT HEART CATH AND CORS/GRAFTS ANGIOGRAPHY N/A 06/22/2020   Procedure: LEFT HEART CATH AND CORS/GRAFTS ANGIOGRAPHY;  Surgeon: Belva Crome, MD;  Location: [employer] CV LAB;  Service: Cardiovascular;  Laterality: N/A;   LEFT HEART CATH AND CORS/GRAFTS ANGIOGRAPHY N/A 01/10/2021   Procedure: LEFT HEART CATH AND CORS/GRAFTS ANGIOGRAPHY;  Surgeon: Leonie Man, MD;  Location: [place] [facility] LAB;  Service: Cardiovascular;  Laterality: N/A;   LEFT HEART CATH AND  CORS/GRAFTS ANGIOGRAPHY N/A 02/24/2021   Procedure: LEFT HEART CATH AND CORS/GRAFTS ANGIOGRAPHY;  Surgeon: Jettie Booze, MD;  Location: [place] CV LAB;  Service: Cardiovascular;  Laterality: N/A;   LEFT HEART CATHETERIZATION WITH CORONARY ANGIOGRAM N/A 12/19/2012   Procedure: LEFT HEART CATHETERIZATION WITH CORONARY ANGIOGRAM;  Surgeon: Josue Hector, MD;  Location: [facility] CATH LAB;  Service: Cardiovascular;  Laterality: N/A;   LEFT HEART CATHETERIZATION WITH CORONARY/GRAFT ANGIOGRAM N/A 04/19/2013   Procedure: LEFT HEART CATHETERIZATION WITH Beatrix Fetters;  Surgeon: Lorretta Harp, MD;  Location: [facility] CATH LAB;  Service: Cardiovascular;  Laterality: N/A;   LIGATION OF ARTERIOVENOUS  FISTULA Left 09/15/2020   Procedure: LIGATION OF LEFT ARM ARTERIOVENOUS  FISTULA;  Surgeon: Waynetta Sandy, MD;  Location: [place];  Service: Vascular;  Laterality: Left;   PATCH ANGIOPLASTY Right 04/18/2013   Procedure: PATCH ANGIOPLASTY Right Internal Carotid Artery;  Surgeon: Mal Misty, MD;  Location: [place];  Service: Vascular;  Laterality: Right;   PERCUTANEOUS CORONARY STENT INTERVENTION (PCI-S) Right 12/19/2012   Procedure: PERCUTANEOUS CORONARY STENT INTERVENTION (PCI-S);  Surgeon: Josue Hector, MD;  Location: [facility] CATH LAB;  Service: Cardiovascular;  Laterality: Right;   PERIPHERAL VASCULAR INTERVENTION Left 08/02/2020   Procedure: PERIPHERAL VASCULAR INTERVENTION;  Surgeon: Waynetta Sandy, MD;  Location: [place] CV LAB;  Service: Cardiovascular;  Laterality: Left;  Left subclavian   PERIPHERAL VASCULAR INTERVENTION  02/24/2021   Procedure: PERIPHERAL VASCULAR INTERVENTION;  Surgeon: Marty Heck, MD;  Location: [place] CV LAB;  Service: Vascular;;   SHOULDER SURGERY       OB History   No obstetric history on file.     Family History  Problem Relation Age of Onset   Diabetes Mother    Heart disease Mother        before age 42   Hyperlipidemia Mother     Hypertension Mother    Thyroid disease Father    Hypertension Father    AAA (abdominal aortic aneurysm) Father    Heart disease Brother        before age 55   Hypertension Brother    Hyperlipidemia Son    Hypertension Son     Social History   Tobacco Use   Smoking status: Former    Packs/day: 1.00    Years: 20.00    Pack years: 20.00    Types: Cigarettes    Quit date: 12/10/2012    Years since quitting: 8.4   Smokeless tobacco: Never  Vaping Use   Vaping Use: Never used  Substance Use Topics   Alcohol use: No    Alcohol/week: 0.0 standard drinks   Drug use: No    Home Medications Prior to  Admission medications   Medication Sig Start Date End Date Taking? Authorizing Provider  Albuterol Sulfate 108 (90 Base) MCG/ACT AEPB Inhale 2 puffs into the lungs every 6 (six) hours as needed (Shortness of breath).    [provider]  allopurinol (ZYLOPRIM) 100 MG tablet Take one tablet after hemodialysis, on Tuesday, Thursday and Saturday. Patient taking differently: Take 100 mg by mouth Every Tuesday,Thursday,and Saturday with dialysis. After dialysis 06/30/20   Arrien, Jimmy Picket, MD  ALPRAZolam Duanne Moron) 0.5 MG tablet Take 0.25-0.5 mg by mouth See admin instructions. Take 0.25 in the morning and evening and 0.5 mg at bedtime    [provider]  aspirin EC 81 MG tablet Take 81 mg by mouth daily.     [provider]  atorvastatin (LIPITOR) 80 MG tablet Take 1 tablet (80 mg total) by mouth every evening. Patient taking differently: Take 80 mg by mouth at bedtime. 12/19/19   Johnson, Clanford L, MD  clopidogrel (PLAVIX) 75 MG tablet Take 1 tablet (75 mg total) by mouth daily. 08/27/18   Satira Sark, MD  ezetimibe (ZETIA) 10 MG tablet Take 1 tablet (10 mg total) by mouth daily. Patient taking differently: Take 10 mg by mouth at bedtime. 02/25/21   Bhagat, Crista Luria, PA  ferric citrate (AURYXIA) 1 GM 210 MG(Fe) tablet Take 420 mg by mouth 3 (three)  times daily with meals.    [provider]  gabapentin (NEURONTIN) 400 MG capsule Take 1 capsule (400 mg total) by mouth 2 (two) times daily. 06/17/20   Barton Dubois, MD  ipratropium (ATROVENT HFA) 17 MCG/ACT inhaler Inhale 2 puffs into the lungs every 4 (four) hours as needed for wheezing.     [provider]  ketorolac (ACULAR) 0.4 % SOLN Place 1 drop into the right eye 4 (four) times daily.    [provider]  levothyroxine (SYNTHROID) 112 MCG tablet Take 112 mcg by mouth daily before breakfast.    [provider]  multivitamin (RENA-VIT) TABS tablet Take 1 tablet by mouth daily.    [provider]  nitroGLYCERIN (NITROSTAT) 0.4 MG SL tablet Place 1 tablet (0.4 mg total) under the tongue every 5 (five) minutes x 3 doses as needed for chest pain. 02/25/21   Bhagat, Bhavinkumar, PA  NOVOLIN 70/30 FLEXPEN (70-30) 100 UNIT/ML KwikPen Inject 24 Units into the skin in the morning and at bedtime. 06/30/20   [provider]  ondansetron (ZOFRAN) 8 MG tablet Take 8 mg by mouth every 8 (eight) hours as needed for nausea. 07/12/20   [provider]  oxyCODONE-acetaminophen (PERCOCET) 5-325 MG tablet Take 1 tablet by mouth every 6 (six) hours as needed for severe pain. 05/11/21 05/11/22  Baglia, Corrina, PA-C  pantoprazole (PROTONIX) 40 MG tablet Take 1 tablet (40 mg total) by mouth 2 (two) times daily. 06/17/20   Barton Dubois, MD  prednisoLONE acetate (PRED FORTE) 1 % ophthalmic suspension Place 1 drop into the right eye daily.    [provider]  ranolazine (RANEXA) 500 MG 12 hr tablet Take 1 tablet (500 mg total) by mouth 2 (two) times daily. 07/09/20   Verta Ellen., NP  Vitamin D, Ergocalciferol, (DRISDOL) 1.25 MG (50000 UNIT) CAPS capsule Take 50,000 Units by mouth every Sunday.  12/31/19   [provider]    Allergies    Penicillins and Beta adrenergic blockers  Review of Systems   Review of Systems   Constitutional:  Negative for diaphoresis and fever.  Respiratory:  Positive for chest tightness and shortness of breath. Negative for cough.   Cardiovascular:  Positive for chest pain. Negative for palpitations and leg swelling.  Gastrointestinal:  Positive for nausea. Negative for diarrhea and vomiting.  All other systems reviewed and are negative.  Physical Exam Updated Vital Signs BP (!) 151/68 (BP Location: Right Arm)   Pulse 90   Temp 97.9 F (36.6 C) (Oral)   Resp 18   Ht 5\' 6"  (1.676 m)   Wt 95.3 kg   SpO2 97%   BMI 33.89 kg/m   Physical Exam Vitals and nursing note reviewed.  Constitutional:      General: She is not in acute distress.    Appearance: She is well-developed.  HENT:     Head: Normocephalic and atraumatic.  Eyes:     General: No scleral icterus.    Pupils: Pupils are equal, round, and reactive to light.  Neck:     Vascular: No JVD.  Cardiovascular:     Rate and Rhythm: Normal rate and regular rhythm.     Pulses:          Radial pulses are 2+ on the right side and 2+ on the left side.       Dorsalis pedis pulses are 2+ on the right side and 2+ on the left side.     Heart sounds: Normal heart sounds. No murmur heard. Pulmonary:     Effort: Pulmonary effort is normal. No respiratory distress.     Breath sounds: Normal breath sounds.  Chest:     Chest wall: No tenderness.  Abdominal:     General: Bowel sounds are normal.     Palpations: Abdomen is soft.  Musculoskeletal:     Cervical back: Normal range of motion.     Right lower leg: No edema.     Left lower leg: No edema.  Skin:    General: Skin is warm and dry.     Capillary Refill: Capillary refill takes less than 2 seconds.     Findings: No rash.  Neurological:     General: No focal deficit present.     Mental Status: She is alert and oriented to person, place, and time.  Psychiatric:        Mood and Affect: Mood normal.        Behavior: Behavior normal.        Thought Content: Thought  content normal.        Judgment: Judgment normal.    ED Results / Procedures / Treatments   Labs (all labs ordered are listed, but only abnormal results are displayed) Labs Reviewed  BASIC METABOLIC PANEL - Abnormal; Notable for the following components:      Result Value   Sodium 132 (*)    Chloride 94 (*)    Glucose, Bld 260 (*)    BUN 45 (*)    Creatinine, Ser 6.66 (*)    GFR, Estimated 7 (*)    All other components within normal limits  CBC - Abnormal; Notable for the following components:   WBC 10.6 (*)    MCV 106.2 (*)    All other components within normal limits  BRAIN NATRIURETIC PEPTIDE - Abnormal; Notable for the following components:   B Natriuretic Peptide 205.0 (*)    All other components within normal limits  CBG MONITORING, ED - Abnormal; Notable for the following components:   Glucose-Capillary 226 (*)    All other components within normal limits  TROPONIN I (  HIGH SENSITIVITY) - Abnormal; Notable for the following components:   Troponin I (High Sensitivity) 18 (*)    All other components within normal limits  TROPONIN I (HIGH SENSITIVITY) - Abnormal; Notable for the following components:   Troponin I (High Sensitivity) 19 (*)    All other components within normal limits    EKG EKG Interpretation  Date/Time:  Saturday May 28 2021 10:39:22 EDT Ventricular Rate:  83 PR Interval:  182 QRS Duration: 108 QT Interval:  408 QTC Calculation: 479 R Axis:   63 Text Interpretation: Normal sinus rhythm ST & T wave abnormality, consider inferolateral ischemia Prolonged QT Abnormal ECG since last tracing no significant change Confirmed by Noemi Chapel [phone]) on 05/28/2021 10:50:24 AM  Radiology DG Chest 2 View  Result Date: 05/28/2021 CLINICAL DATA:  65 year old female with chest pain. EXAM: CHEST - 2 VIEW COMPARISON:  Chest radiographs 02/22/2021 and earlier. FINDINGS: Chronic right chest dual lumen dialysis type catheter. Lung volumes and mediastinal  contours are stable, within normal limits. Prior CABG. Visualized tracheal air column is within normal limits. Stable pulmonary vascularity, no overt edema. No pneumothorax, pleural effusion, acute or confluent pulmonary opacity. Abdominal Calcified aortic atherosclerosis. No acute osseous abnormality identified. Cholecystectomy clips. Negative visible bowel gas pattern. IMPRESSION: No acute cardiopulmonary abnormality. Electronically Signed   By: Genevie Ann M.D.   On: 05/28/2021 11:14    Procedures Procedures   Medications Ordered in ED Medications - No data to display  ED Course  I have reviewed the triage vital signs and the nursing notes.  Pertinent labs & imaging results that were available during my care of the patient were reviewed by me and considered in my medical decision making (see chart for details).    MDM Rules/Calculators/A&P Giannie Soliday is a 65 year old female who presents to the emergency department with chest pain  Her exam is without evidence of volume overload so doubt heart failure.  BNP 205.  Chest x-ray without signs of volume overload. Initial concern for ACS given significant past medical history of same.  Initial troponin 18, repeat 19, delta troponin 1.  EKG initial and repeat EKG without changes.  So doubt STEMI/NSTEMI. Her presentation is not consistent with acute PE as she is not tachycardic, hypotensive, and history is not consistent with risk factors for PE. Chest x-ray without pneumothorax She has no infectious symptoms that would be concerning for pericarditis, myocarditis, pneumonia.  Discussed this case with cardiology who advised repeat troponin and repeat EKG.  If delta troponin negative and no changes in the EKG their recommendation is discharge with outpatient follow-up.  At this time given patient is hemodynamically stable, work-up as described above, will discharge patient with outpatient follow-up with cardiology.  Patient is agreeable to the plan  at this time. Discussed this case with attending Dr. Sabra Heck who agrees with plan. Final Clinical Impression(s) / ED Diagnoses Final diagnoses:  Chest pain, unspecified type    Rx / DC Orders ED Discharge Orders     None        Mickie Hillier, PA-C 05/28/21 1418    Noemi Chapel, MD 05/29/21 [phone]

## 2021-05-28 NOTE — ED Triage Notes (Signed)
Pt c/o chest pain in center of chest that started around 4:30am  Says pain woke her up.  Reports has taken 3 nitro since the pain started  with slight relief and took  81mg  asa.

## 2021-05-28 NOTE — ED Provider Notes (Signed)
This patient is a 65 year old female, she is known to have a history of end-stage renal disease on dialysis, vascular catheter is in the right upper chest, she dialyzes Tuesdays Thursdays and Saturdays, she missed today because she was having chest pain that brought her to the hospital.  She is not short of breath or having swelling of her legs, she is not having fevers or chills.  The pain started 10 hours ago, it has been persistent though it seems to fluctuate in intensity.  It does not radiate, except for a small amount of pain that goes to her back.  She has on exam a clear heart and lung sounds without any rales wheezing or increased work of breathing, her cardiac exam is unremarkable with normal pulses no JVD and no murmurs.  The chest x-ray was totally unremarkable with no signs of pneumothorax or infiltrates, the BNP was 200 troponin was 18, CBC was unremarkable and the metabolic panel showed known elevation in creatinine but no significant electrolyte abnormalities and pertinently a normal potassium at 4.5.  I discussed the case with Dr. Stanford Breed of the cardiology service who recommends a second troponin since the patient's symptoms have been persistent for 10 hours.  If the second troponin is not significantly elevated would suggest a noncardiac cause of this pain.  Again the EKG is at baseline, she has chronic ST abnormalities which are unchanged from prior EKGs.  Medical screening examination/treatment/procedure(s) were conducted as a shared visit with non-physician practitioner(s) and myself.  I personally evaluated the patient during the encounter.  Clinical Impression:   Final diagnoses:  Chest pain, unspecified type         Noemi Chapel, MD 05/29/21 [phone]

## 2021-05-28 NOTE — Discharge Instructions (Addendum)
You are seen in the emergency department today for chest pain.  Given your history of heart attacks and heart surgery we did a thorough work-up of your chest pain.  It does not appear that you are having a heart attack at this time.  Additionally it does not look like you have any other emergent concerns that we found while you are here.  It is very important that you follow-up with your cardiologist regarding this emergency department visit.  I have attached their number and address to your discharge paperwork.  It is imperative that you return to the emergency department if you have recurrent chest pain.  Please look for concerning symptoms including shortness of breath, sweating, radiation of your chest pain to your arm or your jaw, lightheadedness or dizziness with your chest pain, nausea or vomiting with your chest pain.  You may also return to the emergency department for any other reason that is concerning to you.

## 2021-05-30 DIAGNOSIS — Z951 Presence of aortocoronary bypass graft: Secondary | ICD-10-CM | POA: Diagnosis not present

## 2021-05-30 DIAGNOSIS — Z6836 Body mass index (BMI) 36.0-36.9, adult: Secondary | ICD-10-CM | POA: Diagnosis not present

## 2021-05-30 DIAGNOSIS — R140 Abdominal distension (gaseous): Secondary | ICD-10-CM | POA: Diagnosis not present

## 2021-05-30 DIAGNOSIS — I209 Angina pectoris, unspecified: Secondary | ICD-10-CM | POA: Diagnosis not present

## 2021-05-30 DIAGNOSIS — J449 Chronic obstructive pulmonary disease, unspecified: Secondary | ICD-10-CM | POA: Diagnosis not present

## 2021-05-30 DIAGNOSIS — I10 Essential (primary) hypertension: Secondary | ICD-10-CM | POA: Diagnosis not present

## 2021-05-30 DIAGNOSIS — Z8679 Personal history of other diseases of the circulatory system: Secondary | ICD-10-CM | POA: Diagnosis not present

## 2021-05-30 DIAGNOSIS — N189 Chronic kidney disease, unspecified: Secondary | ICD-10-CM | POA: Diagnosis not present

## 2021-05-31 ENCOUNTER — Other Ambulatory Visit: Payer: Self-pay | Admitting: Family Medicine

## 2021-05-31 ENCOUNTER — Other Ambulatory Visit (HOSPITAL_COMMUNITY): Payer: Self-pay | Admitting: Family Medicine

## 2021-05-31 DIAGNOSIS — R140 Abdominal distension (gaseous): Secondary | ICD-10-CM

## 2021-05-31 DIAGNOSIS — Z992 Dependence on renal dialysis: Secondary | ICD-10-CM | POA: Diagnosis not present

## 2021-05-31 DIAGNOSIS — D509 Iron deficiency anemia, unspecified: Secondary | ICD-10-CM | POA: Diagnosis not present

## 2021-05-31 DIAGNOSIS — N2581 Secondary hyperparathyroidism of renal origin: Secondary | ICD-10-CM | POA: Diagnosis not present

## 2021-05-31 DIAGNOSIS — D631 Anemia in chronic kidney disease: Secondary | ICD-10-CM | POA: Diagnosis not present

## 2021-05-31 DIAGNOSIS — D689 Coagulation defect, unspecified: Secondary | ICD-10-CM | POA: Diagnosis not present

## 2021-05-31 DIAGNOSIS — N186 End stage renal disease: Secondary | ICD-10-CM | POA: Diagnosis not present

## 2021-05-31 NOTE — Progress Notes (Signed)
Cardiology Office Note   Date:  06/01/2021   ID:  [name], DOB 1956-08-18, MRN [id number]  PCP:  Curlene Labrum, MD  Cardiologist:  Dr. Domenic Polite    Chief Complaint  Patient presents with   Chest Pain    CAD        History of Present Illness: Donna Howe is a 65 y.o. female who presents for recurrent chest pain.   She has a past medical history of CAD (s/p CABG in 2005 with multiple PCI's since including PTCA to SVG-OM1 in 01/2004, PTCA/DES to LM and prox LCx in 03/2004 and DES to SVG-OM1, PTCA/DES to SVG-OM in 01/2011, PTCA/DES to LM and LCx in 2015, cutting balloon angioplasty to prox Cx and DES to SVG-RCA in 05/2017, NSTEMI in 03/2018 with DES to mid-RCA and DES to LIMA-LAD, NSTEMI in 06/2020 and medical management recommended as she was felt to be high-risk for re-do CABG and poor target vessels, s/p NSTEMI in 12/2020 with DESx2 to SVG-RCA), HTN, HLD, Type 2 DM, carotid artery stenosis (s/p R CEA in 2014), left subclavian stenosis (s/p stent in 07/2020) and ESRD.   12/2020 following a recent hospitalization for an NSTEMI during which she received DES x2 to the SVG-RCA. At the time of her visit, she reported occasional episodes of chest discomfort but felt like symptoms were due to acid reflux as they would resolve with Gas-X or consuming soda. It been discussed during her admission a repeat stress test could be considered if she had repeat anginal symptoms to assess her ischemic burden. She was continued on ASA, Plavix, Atorvastatin and Ranexa as she had previously been intolerant to beta-blocker therapy and Imdur due to fatigue and hypotension.   She was again admitted to [facility] in 02/2021 for evaluation of chest pain and found to have an NSTEMI with peak troponin at 1277. She did have 75% stenosis along the SVG to PDA which was treated with scoring balloon angioplasty and also received DES placement to 80% stenosis along the mid graft lesion. She did have severe  in-stent restenosis of her subclavian stent and it was felt this could be causing anterior wall ischemia due to compromising flow into the LIMA. Vascular Surgery was also called as she was found to have severe in-stent restenosis of the subclavian stent which was treated with angioplasty and stenting by Dr. Carlis Abbott. She was discharged home on 02/25/2021 and it was recommended to continue DAPT indefinitely from a cardiac perspective.  She was seen in ER 05/28/21 for chest pain.  BNP was 200 troponin was 18, CBC was unremarkable and the metabolic panel showed known elevation in creatinine but no significant electrolyte abnormalities and pertinently a normal potassium at 4.5  EKG without changes and with second troponin of 19 pt was discharged.   04/2021 she had Rt brachial artery cephalic vein fistula creation for dialysis.  Today reviewed EKG from ER with no acute changes.  She is having chest pain about every other day.  Begins Lt axilla and  spreads across her chest , she waits until pain is a 8/10 then takes NTG sl with relief.  For a few hours then it returns so she takes again.  Her BP runs low and even difficult to take medications, her imdur and BB have been stopped.  She has some volume overload today because of hypotension with dialysis on T,Th, Sat.   Dr. Boris Lown would like her to be dialyzed 4 times per week but she  does not feel she can do this.  She is on Ranexa once a day but now with break through pain.    BP is low and here I took in her Rt leg using doppler to hear pedal pulse.    Past Medical History:  Diagnosis Date   Anemia    Anxiety    Asthma    CAD (coronary artery disease)    Multivessel s/p CABG 2005, numerous PCIs since that time and documented graft disease   Carotid artery disease ([place])    R CEA   ESRD on hemodialysis ([place])    Essential hypertension    Gout    History of blood transfusion    Hyperlipidemia    Hypothyroidism    Myocardial infarction [facility])    PAD  (peripheral artery disease) ([place])    Dr. Kellie Simmering   Pneumonia 09/2019, 11/2019   S/P angioplasty with stent- DES to mRCA and to LIMA to LAD with DES 04/09/18.   04/10/2018   SBO (small bowel obstruction) ([place]) 2011   Status post lysis of adhesions & hernia repair   Sinus bradycardia    Type 2 diabetes mellitus ([place])    Umbilical hernia     Past Surgical History:  Procedure Laterality Date   AORTIC ARCH ANGIOGRAPHY N/A 08/02/2020   Procedure: AORTIC ARCH ANGIOGRAPHY;  Surgeon: Waynetta Sandy, MD;  Location: [place] [facility];  Service: Cardiovascular;  Laterality: N/A;  Lt upper extermity   AV FISTULA PLACEMENT Left 06/29/2020   Procedure: LEFT ARM ARTERIOVENOUS (AV) FISTULA;  Surgeon: Waynetta Sandy, MD;  Location: [place];  Service: Vascular;  Laterality: Left;  ARM   AV FISTULA PLACEMENT Right 05/11/2021   Procedure: RIGHT BRACHIOCEPHALIC ARTERIOVENOUS (AV) FISTULA CREATION;  Surgeon: Waynetta Sandy, MD;  Location: [facility] OR;  Service: Vascular;  Laterality: Right;   [place]  2010   CORONARY ARTERY BYPASS GRAFT  2005   CORONARY BALLOON ANGIOPLASTY N/A 05/31/2017   Procedure: CORONARY BALLOON ANGIOPLASTY;  Surgeon: Jettie Booze, MD;  Location: [place] [facility];  Service: Cardiovascular;  Laterality: N/A;   CORONARY STENT INTERVENTION N/A 05/31/2017   Procedure: CORONARY STENT INTERVENTION;  Surgeon: Jettie Booze, MD;  Location: [place] [facility];  Service: Cardiovascular;  Laterality: N/A;   CORONARY STENT INTERVENTION N/A 04/09/2018   Procedure: CORONARY STENT INTERVENTION;  Surgeon: Jettie Booze, MD;  Location: [place] [facility];  Service: Cardiovascular;  Laterality: N/A;  SVG RCA   CORONARY STENT INTERVENTION N/A 01/10/2021   Procedure: CORONARY STENT INTERVENTION;  Surgeon: Leonie Man, MD;  Location: [place] [facility];  Service: Cardiovascular;  Laterality: N/A;   CORONARY/GRAFT ACUTE MI  REVASCULARIZATION N/A 02/24/2021   Procedure: Coronary/Graft Acute MI Revascularization;  Surgeon: Jettie Booze, MD;  Location: [place] [facility];  Service: Cardiovascular;  Laterality: N/A;   ENDARTERECTOMY Right 04/18/2013   Procedure: ENDARTERECTOMY CAROTID;  Surgeon: Mal Misty, MD;  Location: [place];  Service: Vascular;  Laterality: Right;   HERNIA REPAIR  1989   Incisional hernia repair x2  03/04/2010   Laparoscopic with 35cm mesh by Dr Ronnald Collum   IR Nectar CV LINE RIGHT  06/21/2020   IR US GUIDE VASC ACCESS RIGHT  06/21/2020   LEFT HEART CATH AND CORS/GRAFTS ANGIOGRAPHY N/A 05/31/2017   Procedure: LEFT HEART CATH AND CORS/GRAFTS ANGIOGRAPHY;  Surgeon: Jettie Booze, MD;  Location: [place] [facility];  Service: Cardiovascular;  Laterality: N/A;   LEFT HEART CATH AND CORS/GRAFTS ANGIOGRAPHY N/A 04/08/2018   Procedure: LEFT HEART CATH AND CORS/GRAFTS ANGIOGRAPHY;  Surgeon: Jettie Booze, MD;  Location: [place] [facility];  Service: Cardiovascular;  Laterality: N/A;   LEFT HEART CATH AND CORS/GRAFTS ANGIOGRAPHY N/A 06/22/2020   Procedure: LEFT HEART CATH AND CORS/GRAFTS ANGIOGRAPHY;  Surgeon: Belva Crome, MD;  Location: [place] [facility];  Service: Cardiovascular;  Laterality: N/A;   LEFT HEART CATH AND CORS/GRAFTS ANGIOGRAPHY N/A 01/10/2021   Procedure: LEFT HEART CATH AND CORS/GRAFTS ANGIOGRAPHY;  Surgeon: Leonie Man, MD;  Location: [place] [facility];  Service: Cardiovascular;  Laterality: N/A;   LEFT HEART CATH AND CORS/GRAFTS ANGIOGRAPHY N/A 02/24/2021   Procedure: LEFT HEART CATH AND CORS/GRAFTS ANGIOGRAPHY;  Surgeon: Jettie Booze, MD;  Location: [place] [facility];  Service: Cardiovascular;  Laterality: N/A;   LEFT HEART CATHETERIZATION WITH CORONARY ANGIOGRAM N/A 12/19/2012   Procedure: LEFT HEART CATHETERIZATION WITH CORONARY ANGIOGRAM;  Surgeon: Josue Hector, MD;  Location: [facility] C[facility] LAB;  Service: Cardiovascular;  Laterality: N/A;   LEFT HEART  CATHETERIZATION WITH CORONARY/GRAFT ANGIOGRAM N/A 04/19/2013   Procedure: LEFT HEART CATHETERIZATION WITH Beatrix Fetters;  Surgeon: Lorretta Harp, MD;  Location: [facility] CATH LAB;  Service: Cardiovascular;  Laterality: N/A;   LIGATION OF ARTERIOVENOUS  FISTULA Left 09/15/2020   Procedure: LIGATION OF LEFT ARM ARTERIOVENOUS  FISTULA;  Surgeon: Waynetta Sandy, MD;  Location: [place];  Service: Vascular;  Laterality: Left;   PATCH ANGIOPLASTY Right 04/18/2013   Procedure: PATCH ANGIOPLASTY Right Internal Carotid Artery;  Surgeon: Mal Misty, MD;  Location: [place];  Service: Vascular;  Laterality: Right;   PERCUTANEOUS CORONARY STENT INTERVENTION (PCI-S) Right 12/19/2012   Procedure: PERCUTANEOUS CORONARY STENT INTERVENTION (PCI-S);  Surgeon: Josue Hector, MD;  [facility] LAB;  Service: Cardiovascular;  Laterality: Right;   PERIPHERAL VASCULAR INTERVENTION Left 08/02/2020   Procedure: PERIPHERAL VASCULAR INTERVENTION;  Surgeon: Waynetta Sandy, MD;  Location: [place] [facility];  Service: Cardiovascular;  Laterality: Left;  Left subclavian   PERIPHERAL VASCULAR INTERVENTION  02/24/2021   Procedure: PERIPHERAL VASCULAR INTERVENTION;  Surgeon: Marty Heck, MD;  Location: [place] [facility];  Service: Vascular;;   SHOULDER SURGERY       Current Outpatient Medications  Medication Sig Dispense Refill   Albuterol Sulfate 108 (90 Base) MCG/ACT AEPB Inhale 2 puffs into the lungs every 6 (six) hours as needed (Shortness of breath).     allopurinol (ZYLOPRIM) 100 MG tablet Take one tablet after hemodialysis, on Tuesday, Thursday and Saturday. (Patient taking differently: Take 100 mg by mouth Every Tuesday,Thursday,and Saturday with dialysis. After dialysis) 30 tablet 11   ALPRAZolam (XANAX) 0.5 MG tablet Take 0.25-0.5 mg by mouth See admin instructions. Take 0.25 in the morning and evening and 0.5 mg at bedtime     aspirin EC 81 MG tablet Take 81 mg by mouth daily.       atorvastatin (LIPITOR) 80 MG tablet Take 1 tablet (80 mg total) by mouth every evening. (Patient taking differently: Take 80 mg by mouth at bedtime.)     clopidogrel (PLAVIX) 75 MG tablet Take 1 tablet (75 mg total) by mouth daily. 90 tablet 3   diclofenac Sodium (VOLTAREN) 1 % GEL Apply 2-4 g topically daily as needed (FOR NECK PAIN).     ezetimibe (ZETIA) 10 MG tablet Take 1 tablet (10 mg total) by mouth daily. (Patient taking differently: Take 10 mg  by mouth at bedtime.) 30 tablet 11   ferric citrate (AURYXIA) 1 GM 210 MG(Fe) tablet Take 420 mg by mouth 3 (three) times daily with meals.     gabapentin (NEURONTIN) 400 MG capsule Take 1 capsule (400 mg total) by mouth 2 (two) times daily.     ipratropium (ATROVENT HFA) 17 MCG/ACT inhaler Inhale 2 puffs into the lungs every 4 (four) hours as needed for wheezing.      levothyroxine (SYNTHROID) 112 MCG tablet Take 112 mcg by mouth daily before breakfast.     Methoxy PEG-Epoetin Beta (MIRCERA IJ) Mircera     midodrine (PROAMATINE) 5 MG tablet Take 1 tablet (5 mg total) by mouth 3 (three) times daily with meals. 90 tablet 11   multivitamin (RENA-VIT) TABS tablet Take 1 tablet by mouth daily.     nitroGLYCERIN (NITROSTAT) 0.4 MG SL tablet Place 1 tablet (0.4 mg total) under the tongue every 5 (five) minutes x 3 doses as needed for chest pain. 100 tablet 3   NOVOLIN 70/30 FLEXPEN (70-30) 100 UNIT/ML KwikPen Inject 24 Units into the skin in the morning and at bedtime.     ondansetron (ZOFRAN) 8 MG tablet Take 8 mg by mouth every 8 (eight) hours as needed for nausea.     ONETOUCH ULTRA test strip Check blood glucose THREE TIMES DAILY     pantoprazole (PROTONIX) 40 MG tablet Take 1 tablet (40 mg total) by mouth 2 (two) times daily. 60 tablet 1   ranolazine (RANEXA) 500 MG 12 hr tablet Take 1 tablet (500 mg total) by mouth 2 (two) times daily. 60 tablet 1   Vitamin D, Ergocalciferol, (DRISDOL) 1.25 MG (50000 UNIT) CAPS capsule Take 50,000 Units by mouth  every Sunday.      oxyCODONE-acetaminophen (PERCOCET) 5-325 MG tablet Take 1 tablet by mouth every 6 (six) hours as needed for severe pain. (Patient not taking: No sig reported) 8 tablet 0   No current facility-administered medications for this visit.    Allergies:   Penicillins and Beta adrenergic blockers    Social History:  The patient  reports that she quit smoking about 8 years ago. Her smoking use included cigarettes. She has a 20.00 pack-year smoking history. She has never used smokeless tobacco. She reports that she does not drink alcohol and does not use drugs.   Family History:  The patient's family history includes AAA (abdominal aortic aneurysm) in her father; Diabetes in her mother; Heart disease in her brother and mother; Hyperlipidemia in her mother and son; Hypertension in her brother, father, mother, and son; Thyroid disease in her father.    ROS:  General:no colds or fevers, + weight increase of 4 lbs, she does not feel they are removing enough from her with HD.  Skin:no rashes or ulcers HEENT:no blurred vision, no congestion CV:see HPI PUL:see HPI GI:no diarrhea constipation or melena, no indigestion GU:no hematuria, no dysuria MS:no joint pain, no claudication Neuro:no syncope, no lightheadedness Endo:+ diabetes, + thyroid disease  Wt Readings from Last 3 Encounters:  06/01/21 214 lb 9.6 oz (97.3 kg)  05/28/21 210 lb (95.3 kg)  05/11/21 210 lb (95.3 kg)     PHYSICAL EXAM: VS:  BP (!) 98/40 (BP Location: Right Leg, Cuff Size: Normal)   Pulse 89   Ht 5' 6"  (1.676 m)   Wt 214 lb 9.6 oz (97.3 kg)   SpO2 93%   BMI 34.64 kg/m  , BMI Body mass index is 34.64 kg/m. BP check with doppler  could not palpate Rt post tib or pedal pulse. General:Pleasant affect, NAD Skin:Warm and dry, brisk capillary refill HEENT:normocephalic, sclera clear, mucus membranes moist Neck:supple, no JVD sitting upright, no bruits  Heart:S1S2 RRR without murmur, gallup, rub or  click Lungs:clear without rales, rhonchi, or wheezes KCL:EXNT, non tender, + BS, do not palpate liver spleen or masses Ext:no lower ext edema, 2+ pedal pulses, 2+ radial pulses Neuro:alert and oriented X 3, MAE, follows commands, + facial symmetry    EKG:  EKG is NOT ordered today. The ekg from 05/28/21 demonstrates SR with ST abnormality but no changes from 03/2021. I personally reviewed.    Recent Labs: 06/18/2020: TSH 1.057 01/10/2021: ALT 14; Magnesium 1.9 05/28/2021: B Natriuretic Peptide 205.0; BUN 45; Creatinine, Ser 6.66; Hemoglobin 13.8; Platelets 191; Potassium 4.5; Sodium 132    Lipid Panel    Component Value Date/Time   CHOL 168 02/23/2021 0446   TRIG 257 (H) 02/23/2021 0446   HDL 41 02/23/2021 0446   CHOLHDL 4.1 02/23/2021 0446   VLDL 51 (H) 02/23/2021 0446   LDLCALC 76 02/23/2021 0446       Other studies Reviewed: Additional studies/ records that were reviewed today include: . Cardiac Catheterization: 02/24/2021 Ost LM to Dist LM lesion is 95% stenosed. LIMA to LAD is patent. Prox LAD to Mid LAD lesion is 100% stenosed. Known from prior catheterization. Unable to advance left catheter through the severely diseased subclavian stent. Ost LAD to Prox LAD lesion is 100% stenosed. 1st Mrg-1 lesion is 99% stenosed. SVG to OM occluded. 1st Mrg-2 lesion is 100% stenosed. Ost Cx to Mid Cx lesion is 99% stenosed. Origin to Prox Graft lesion before 1st Mrg is 100% stenosed. LIMA and is normal in caliber. Ostial LIMA stent is 60% narrowed. Prox RCA lesion is 100% stenosed. SVG to PDA showed prox Graft lesion is 75% stenosed. Scoring balloon angioplasty was performed using a BALLOON SCOREFLEX 3.0X10. Multipurpose guide catheter was able to track through the subclavian stent. Post intervention, there is a 0% residual stenosis. SVG to PDA showed mid Graft lesion is 80% stenosed. A drug-eluting stent was successfully placed using a SYNERGY XD 3.50X16. Post intervention, there  is a 0% residual stenosis. LV end diastolic pressure is moderately elevated. There is no aortic valve stenosis. Origin lesion is 20% stenosed.   Due to the severe in-stent restenosis of the subclavian stent, Dr. Carlis Abbott was called.  I suspect this was causing anterior wall ischemia due to compromising flow into the LIMA.  There was a 30 mm gradient across the stent.  There was clear difficulty getting equipment across the stent to the heart as noted in the report.  He is planning to place a covered stent over the disease to bare-metal stent which was previously placed.   From a cardiac standpoint, continue aggressive secondary prevention.  She will need dual antiplatelet therapy.   Peripheral Vascular Intervention: 02/24/2021 Procedure: I was called to lab 9 to evaluate her left subclavian stent and brachial access had been previously obtained by Dr. Irish Lack.  In evaluating the images, it did appear that there was a high-grade greater than 70 to 80% in-stent stenosis in the left subclavian stent and this had a pressure gradient of greater than 30 mmHg as measured by cardiology.  Once Dr. Irish Lack was finished with his portion of the procedure, I then used a versa core wire to exchange for a long 7 [place] destination sheath in the left brachial artery.  I advanced this all the  way into the axillary artery.  Initially I planned to realign her existing subclavian stent which was bare-metal with a covered stent.  It became apparent that this was a very tight lesion and I could not even get an 035 catheter to track across the wire in the left subclavian stent and met resistance.  Ultimately initially tried to also cross a 4 mm Mustang balloon unsuccessful.  I then downsized to a V 18 wire and a smaller system and finally was able to get a 4 mm Sterling to cross and predilated the left subclavian in-stent stenosis with a 4 mm Sterling to nominal pressure for 2 minutes.  I then used a 5 mm Sterling to nominal  pressure for 2 minutes.  That point time I tried to place a 7 mm VBX inside the existing stent but this would not track even after pre-dilation.  I then used a KMP catheter to cross the stent and exchanged for a stiffer Rosen wire down in the descending thoracic aorta.  Additional 3000 units of IV heparin was given we did check an ACT to maintain greater than 250.  At this point in time I advanced the sheath as far as I could into the left subclavian artery but it would not track across the left subclavian stent.  I then elected to finally predilate th left subclavian stent with a 6 mm x 20 mm Mustang over the [place] system.  Finally I was able to get the VBX to track after dilation with a 6 mm baloon and I stented her proximal left subclavian making sure the stent was a little more distal given there appeared to be some calcified disease just distal to her previous stent.  This was deployed to nominal pressure inflating it to about 6.5 mm given her previous stent was 6 mm.  Final injection showed filling of the LIMA graft with no evidence of residual stenosis.  I did put a KMP catheter across the stent in her aortic arch and did a pullback pressure and there was no residual gradient at completion in the left subclavian stent.  Wires and catheters were removed.  She was taken to holding to have the sheath removed.   Echo 06/19/20 IMPRESSIONS     1. Left ventricular ejection fraction, by estimation, is 55 to 60%. The  left ventricle has normal function. The left ventricle has no regional  wall motion abnormalities.   2. The mitral valve is normal in structure. Mild to moderate mitral valve  regurgitation. No evidence of mitral stenosis.   3. Limited echocardiogram to reevalaute LV function and wall motion.   FINDINGS   Left Ventricle: Left ventricular ejection fraction, by estimation, is 55  to 60%. The left ventricle has normal function. The left ventricle has no  regional wall motion abnormalities.  Definity contrast agent was given IV  to delineate the left ventricular   endocardial borders.   Mitral Valve: The mitral valve is normal in structure. Mild to moderate  mitral valve regurgitation. No evidence of mitral valve stenosis.   LEFT VENTRICLE  PLAX 2D  LVIDd:         5.10 cm  LVIDs:         3.40 cm  LV PW:         1.50 cm  LV IVS:        1.50 cm  LVOT diam:     1.70 cm  LVOT Area:     2.27 cm  LEFT ATRIUM         Index  LA diam:    3.90 cm 1.82 cm/m      AORTA  Ao Root diam: 3.30 cm   ASSESSMENT AND PLAN:  1.  Ongoing angina and complex to treat.  Her BP is too soft to add meds, Dr. Domenic Polite would like to add BB first, to bring up BP will add Midodrine 5 mg TID - will need to be monitored.  Will notify Dr Marval Regal as well.  (Review of hospital records and outpt records)  2.  Significant CAD with CABG in 2005 with several PCIs since and 12/2020 DES X 2 to VG to RCA, in 02/2021 NSTEMI wth 75% stenosis of VG to PDA treated wth scoring balloon and DES to mid graft lesion, she then had PCI to Lt subclavian stent which was compromising flow to LIMA to LAD.   Recent eval wth CTA  04/20/21 with patent left subclavian artery stent.  High grade stenosis of distal Lt subclavian artery.  This stenosis is distal to her LIMA graft, and her Lt hand is improving (numbness last 3 fingers).   CONTINUE on ASA, plavix, atorvastatin, ranexa (once a day)   3.  ESRD on HD T,TH,Sat due to hypotension not pulling off as much volume. She was offered 4 days per week but she does not feel she can do this.   Will add midodrine for now.  4.  HLD on lipitor and zetia.- LDL in June 76, HDL 41, TG 257  5.  HTN no hypotension on no BP meds. Difficult to medically control angina.though with her sl NTG she tolerates.   6.  Carotid stenosis with R CEA in 2014 and followed by vascular, on statin zetia asa and plavix  Follow up in 2 weeks to re-eval and if possible begin BB.    Current medicines are  reviewed with the patient today.  The patient Has no concerns regarding medicines.  The following changes have been made:  See above Labs/ tests ordered today include:see above  Disposition:   FU:  see above  Signed, Cecilie Kicks, NP  06/01/2021 2:17 PM    [address] [place], [place], [place] Phone: [phone]; Fax: [phone]

## 2021-06-01 ENCOUNTER — Other Ambulatory Visit: Payer: Self-pay

## 2021-06-01 ENCOUNTER — Ambulatory Visit (INDEPENDENT_AMBULATORY_CARE_PROVIDER_SITE_OTHER): Payer: Medicare HMO | Admitting: Cardiology

## 2021-06-01 ENCOUNTER — Encounter: Payer: Self-pay | Admitting: Cardiology

## 2021-06-01 VITALS — BP 98/40 | HR 89 | Ht 66.0 in | Wt 214.6 lb

## 2021-06-01 DIAGNOSIS — N186 End stage renal disease: Secondary | ICD-10-CM | POA: Diagnosis not present

## 2021-06-01 DIAGNOSIS — I25110 Atherosclerotic heart disease of native coronary artery with unstable angina pectoris: Secondary | ICD-10-CM | POA: Diagnosis not present

## 2021-06-01 DIAGNOSIS — I6523 Occlusion and stenosis of bilateral carotid arteries: Secondary | ICD-10-CM | POA: Diagnosis not present

## 2021-06-01 DIAGNOSIS — I200 Unstable angina: Secondary | ICD-10-CM

## 2021-06-01 DIAGNOSIS — I10 Essential (primary) hypertension: Secondary | ICD-10-CM | POA: Diagnosis not present

## 2021-06-01 DIAGNOSIS — E782 Mixed hyperlipidemia: Secondary | ICD-10-CM

## 2021-06-01 DIAGNOSIS — I771 Stricture of artery: Secondary | ICD-10-CM | POA: Diagnosis not present

## 2021-06-01 DIAGNOSIS — I25700 Atherosclerosis of coronary artery bypass graft(s), unspecified, with unstable angina pectoris: Secondary | ICD-10-CM

## 2021-06-01 DIAGNOSIS — Z992 Dependence on renal dialysis: Secondary | ICD-10-CM

## 2021-06-01 MED ORDER — MIDODRINE HCL 5 MG PO TABS: 5.0000 mg | ORAL_TABLET | Freq: Three times a day (TID) | ORAL | 11 refills | Status: DC

## 2021-06-01 NOTE — Patient Instructions (Signed)
Medication Instructions:   Start Midodrine 5 mg Three Times Daily   *If you need a refill on your cardiac medications before your next appointment, please call your pharmacy*   Lab Work: NONE   If you have labs (blood work) drawn today and your tests are completely normal, you will receive your results only by: [employer] (if you have MyChart) OR A paper copy in the mail If you have any lab test that is abnormal or we need to change your treatment, we will call you to review the results.   Testing/Procedures: NONE    Follow-Up: At [facility], you and your health needs are our priority.  As part of our continuing mission to provide you with exceptional heart care, we have created designated Provider Care Teams.  These Care Teams include your primary Cardiologist (physician) and Advanced Practice Providers (APPs -  Physician Assistants and Nurse Practitioners) who all work together to provide you with the care you need, when you need it.  We recommend signing up for the patient portal called "MyChart".  Sign up information is provided on this After Visit Summary.  MyChart is used to connect with patients for Virtual Visits (Telemedicine).  Patients are able to view lab/test results, encounter notes, upcoming appointments, etc.  Non-urgent messages can be sent to your provider as well.   To learn more about what you can do with MyChart, go to [email or web address].    Your next appointment:   2 week(s)  The format for your next appointment:   In Person  Provider:   You may see Rozann Lesches, MD or one of the following Advanced Practice Providers on your designated Care Team:   Bernerd Pho, PA-C  Ermalinda Barrios, PA-C    Other Instructions Thank you for choosing [facility]!

## 2021-06-02 DIAGNOSIS — D689 Coagulation defect, unspecified: Secondary | ICD-10-CM | POA: Diagnosis not present

## 2021-06-02 DIAGNOSIS — N186 End stage renal disease: Secondary | ICD-10-CM | POA: Diagnosis not present

## 2021-06-02 DIAGNOSIS — N2581 Secondary hyperparathyroidism of renal origin: Secondary | ICD-10-CM | POA: Diagnosis not present

## 2021-06-02 DIAGNOSIS — Z992 Dependence on renal dialysis: Secondary | ICD-10-CM | POA: Diagnosis not present

## 2021-06-02 DIAGNOSIS — D631 Anemia in chronic kidney disease: Secondary | ICD-10-CM | POA: Diagnosis not present

## 2021-06-02 DIAGNOSIS — D509 Iron deficiency anemia, unspecified: Secondary | ICD-10-CM | POA: Diagnosis not present

## 2021-06-03 ENCOUNTER — Other Ambulatory Visit (HOSPITAL_COMMUNITY): Payer: Self-pay | Admitting: Family Medicine

## 2021-06-03 DIAGNOSIS — U099 Post covid-19 condition, unspecified: Secondary | ICD-10-CM

## 2021-06-04 DIAGNOSIS — N186 End stage renal disease: Secondary | ICD-10-CM | POA: Diagnosis not present

## 2021-06-04 DIAGNOSIS — D509 Iron deficiency anemia, unspecified: Secondary | ICD-10-CM | POA: Diagnosis not present

## 2021-06-04 DIAGNOSIS — D631 Anemia in chronic kidney disease: Secondary | ICD-10-CM | POA: Diagnosis not present

## 2021-06-04 DIAGNOSIS — N2581 Secondary hyperparathyroidism of renal origin: Secondary | ICD-10-CM | POA: Diagnosis not present

## 2021-06-04 DIAGNOSIS — D689 Coagulation defect, unspecified: Secondary | ICD-10-CM | POA: Diagnosis not present

## 2021-06-04 DIAGNOSIS — Z992 Dependence on renal dialysis: Secondary | ICD-10-CM | POA: Diagnosis not present

## 2021-06-07 DIAGNOSIS — E1122 Type 2 diabetes mellitus with diabetic chronic kidney disease: Secondary | ICD-10-CM | POA: Diagnosis not present

## 2021-06-07 DIAGNOSIS — N186 End stage renal disease: Secondary | ICD-10-CM | POA: Diagnosis not present

## 2021-06-07 DIAGNOSIS — N2581 Secondary hyperparathyroidism of renal origin: Secondary | ICD-10-CM | POA: Diagnosis not present

## 2021-06-07 DIAGNOSIS — D509 Iron deficiency anemia, unspecified: Secondary | ICD-10-CM | POA: Diagnosis not present

## 2021-06-07 DIAGNOSIS — Z992 Dependence on renal dialysis: Secondary | ICD-10-CM | POA: Diagnosis not present

## 2021-06-07 DIAGNOSIS — D689 Coagulation defect, unspecified: Secondary | ICD-10-CM | POA: Diagnosis not present

## 2021-06-08 ENCOUNTER — Ambulatory Visit (HOSPITAL_COMMUNITY)
Admission: RE | Admit: 2021-06-08 | Discharge: 2021-06-08 | Disposition: A | Discharge status: Home / Self Care | Payer: Medicare HMO | Source: Ambulatory Visit | Attending: Family Medicine | Admitting: Family Medicine

## 2021-06-08 ENCOUNTER — Other Ambulatory Visit: Payer: Self-pay

## 2021-06-08 DIAGNOSIS — Z992 Dependence on renal dialysis: Secondary | ICD-10-CM

## 2021-06-08 DIAGNOSIS — N261 Atrophy of kidney (terminal): Secondary | ICD-10-CM | POA: Diagnosis not present

## 2021-06-08 DIAGNOSIS — N186 End stage renal disease: Secondary | ICD-10-CM

## 2021-06-08 DIAGNOSIS — K838 Other specified diseases of biliary tract: Secondary | ICD-10-CM | POA: Diagnosis not present

## 2021-06-08 DIAGNOSIS — R140 Abdominal distension (gaseous): Secondary | ICD-10-CM | POA: Insufficient documentation

## 2021-06-09 DIAGNOSIS — Z992 Dependence on renal dialysis: Secondary | ICD-10-CM | POA: Diagnosis not present

## 2021-06-09 DIAGNOSIS — D509 Iron deficiency anemia, unspecified: Secondary | ICD-10-CM | POA: Diagnosis not present

## 2021-06-09 DIAGNOSIS — E1122 Type 2 diabetes mellitus with diabetic chronic kidney disease: Secondary | ICD-10-CM | POA: Diagnosis not present

## 2021-06-09 DIAGNOSIS — N186 End stage renal disease: Secondary | ICD-10-CM | POA: Diagnosis not present

## 2021-06-09 DIAGNOSIS — N2581 Secondary hyperparathyroidism of renal origin: Secondary | ICD-10-CM | POA: Diagnosis not present

## 2021-06-09 DIAGNOSIS — D689 Coagulation defect, unspecified: Secondary | ICD-10-CM | POA: Diagnosis not present

## 2021-06-11 DIAGNOSIS — D689 Coagulation defect, unspecified: Secondary | ICD-10-CM | POA: Diagnosis not present

## 2021-06-11 DIAGNOSIS — N2581 Secondary hyperparathyroidism of renal origin: Secondary | ICD-10-CM | POA: Diagnosis not present

## 2021-06-11 DIAGNOSIS — N186 End stage renal disease: Secondary | ICD-10-CM | POA: Diagnosis not present

## 2021-06-11 DIAGNOSIS — D509 Iron deficiency anemia, unspecified: Secondary | ICD-10-CM | POA: Diagnosis not present

## 2021-06-11 DIAGNOSIS — E1122 Type 2 diabetes mellitus with diabetic chronic kidney disease: Secondary | ICD-10-CM | POA: Diagnosis not present

## 2021-06-11 DIAGNOSIS — Z992 Dependence on renal dialysis: Secondary | ICD-10-CM | POA: Diagnosis not present

## 2021-06-13 ENCOUNTER — Emergency Department (HOSPITAL_COMMUNITY): Payer: Medicare HMO

## 2021-06-13 ENCOUNTER — Other Ambulatory Visit: Payer: Self-pay

## 2021-06-13 ENCOUNTER — Inpatient Hospital Stay (HOSPITAL_COMMUNITY)
Admission: EM | Admit: 2021-06-13 | Discharge: 2021-06-17 | DRG: 250 | Disposition: A | Discharge status: Home / Self Care | Payer: Medicare HMO | Attending: Internal Medicine | Admitting: Internal Medicine

## 2021-06-13 ENCOUNTER — Encounter (HOSPITAL_COMMUNITY): Payer: Self-pay

## 2021-06-13 DIAGNOSIS — E1165 Type 2 diabetes mellitus with hyperglycemia: Secondary | ICD-10-CM

## 2021-06-13 DIAGNOSIS — E039 Hypothyroidism, unspecified: Secondary | ICD-10-CM

## 2021-06-13 DIAGNOSIS — I120 Hypertensive chronic kidney disease with stage 5 chronic kidney disease or end stage renal disease: Secondary | ICD-10-CM | POA: Diagnosis present

## 2021-06-13 DIAGNOSIS — Z79890 Hormone replacement therapy (postmenopausal): Secondary | ICD-10-CM

## 2021-06-13 DIAGNOSIS — Z8249 Family history of ischemic heart disease and other diseases of the circulatory system: Secondary | ICD-10-CM

## 2021-06-13 DIAGNOSIS — E782 Mixed hyperlipidemia: Secondary | ICD-10-CM

## 2021-06-13 DIAGNOSIS — Z83438 Family history of other disorder of lipoprotein metabolism and other lipidemia: Secondary | ICD-10-CM

## 2021-06-13 DIAGNOSIS — E1122 Type 2 diabetes mellitus with diabetic chronic kidney disease: Secondary | ICD-10-CM | POA: Diagnosis present

## 2021-06-13 DIAGNOSIS — Z7902 Long term (current) use of antithrombotics/antiplatelets: Secondary | ICD-10-CM

## 2021-06-13 DIAGNOSIS — I959 Hypotension, unspecified: Secondary | ICD-10-CM | POA: Diagnosis present

## 2021-06-13 DIAGNOSIS — I25110 Atherosclerotic heart disease of native coronary artery with unstable angina pectoris: Secondary | ICD-10-CM | POA: Diagnosis present

## 2021-06-13 DIAGNOSIS — D631 Anemia in chronic kidney disease: Secondary | ICD-10-CM | POA: Diagnosis present

## 2021-06-13 DIAGNOSIS — Z794 Long term (current) use of insulin: Secondary | ICD-10-CM

## 2021-06-13 DIAGNOSIS — I214 Non-ST elevation (NSTEMI) myocardial infarction: Secondary | ICD-10-CM | POA: Diagnosis present

## 2021-06-13 DIAGNOSIS — Z833 Family history of diabetes mellitus: Secondary | ICD-10-CM

## 2021-06-13 DIAGNOSIS — R9431 Abnormal electrocardiogram [ECG] [EKG]: Secondary | ICD-10-CM | POA: Diagnosis present

## 2021-06-13 DIAGNOSIS — I447 Left bundle-branch block, unspecified: Secondary | ICD-10-CM | POA: Diagnosis present

## 2021-06-13 DIAGNOSIS — E8770 Fluid overload, unspecified: Secondary | ICD-10-CM | POA: Diagnosis present

## 2021-06-13 DIAGNOSIS — E8889 Other specified metabolic disorders: Secondary | ICD-10-CM | POA: Diagnosis present

## 2021-06-13 DIAGNOSIS — R079 Chest pain, unspecified: Secondary | ICD-10-CM | POA: Diagnosis not present

## 2021-06-13 DIAGNOSIS — Z9049 Acquired absence of other specified parts of digestive tract: Secondary | ICD-10-CM

## 2021-06-13 DIAGNOSIS — T82855A Stenosis of coronary artery stent, initial encounter: Principal | ICD-10-CM | POA: Diagnosis present

## 2021-06-13 DIAGNOSIS — Z79899 Other long term (current) drug therapy: Secondary | ICD-10-CM

## 2021-06-13 DIAGNOSIS — K5900 Constipation, unspecified: Secondary | ICD-10-CM | POA: Diagnosis present

## 2021-06-13 DIAGNOSIS — I10 Essential (primary) hypertension: Secondary | ICD-10-CM | POA: Diagnosis not present

## 2021-06-13 DIAGNOSIS — E1151 Type 2 diabetes mellitus with diabetic peripheral angiopathy without gangrene: Secondary | ICD-10-CM | POA: Diagnosis present

## 2021-06-13 DIAGNOSIS — E669 Obesity, unspecified: Secondary | ICD-10-CM | POA: Diagnosis present

## 2021-06-13 DIAGNOSIS — Z6835 Body mass index (BMI) 35.0-35.9, adult: Secondary | ICD-10-CM

## 2021-06-13 DIAGNOSIS — Z992 Dependence on renal dialysis: Secondary | ICD-10-CM

## 2021-06-13 DIAGNOSIS — I9589 Other hypotension: Secondary | ICD-10-CM | POA: Diagnosis present

## 2021-06-13 DIAGNOSIS — J811 Chronic pulmonary edema: Secondary | ICD-10-CM | POA: Diagnosis present

## 2021-06-13 DIAGNOSIS — Z888 Allergy status to other drugs, medicaments and biological substances status: Secondary | ICD-10-CM

## 2021-06-13 DIAGNOSIS — Z880 Allergy status to penicillin: Secondary | ICD-10-CM

## 2021-06-13 DIAGNOSIS — Z20822 Contact with and (suspected) exposure to covid-19: Secondary | ICD-10-CM | POA: Diagnosis present

## 2021-06-13 DIAGNOSIS — K219 Gastro-esophageal reflux disease without esophagitis: Secondary | ICD-10-CM | POA: Diagnosis present

## 2021-06-13 DIAGNOSIS — J45909 Unspecified asthma, uncomplicated: Secondary | ICD-10-CM | POA: Diagnosis present

## 2021-06-13 DIAGNOSIS — Z8349 Family history of other endocrine, nutritional and metabolic diseases: Secondary | ICD-10-CM

## 2021-06-13 DIAGNOSIS — R718 Other abnormality of red blood cells: Secondary | ICD-10-CM | POA: Diagnosis present

## 2021-06-13 DIAGNOSIS — I2510 Atherosclerotic heart disease of native coronary artery without angina pectoris: Secondary | ICD-10-CM | POA: Diagnosis present

## 2021-06-13 DIAGNOSIS — R110 Nausea: Secondary | ICD-10-CM | POA: Diagnosis not present

## 2021-06-13 DIAGNOSIS — Z951 Presence of aortocoronary bypass graft: Secondary | ICD-10-CM

## 2021-06-13 DIAGNOSIS — Z87891 Personal history of nicotine dependence: Secondary | ICD-10-CM

## 2021-06-13 DIAGNOSIS — Z955 Presence of coronary angioplasty implant and graft: Secondary | ICD-10-CM

## 2021-06-13 DIAGNOSIS — I252 Old myocardial infarction: Secondary | ICD-10-CM

## 2021-06-13 DIAGNOSIS — D72829 Elevated white blood cell count, unspecified: Secondary | ICD-10-CM | POA: Diagnosis present

## 2021-06-13 DIAGNOSIS — Y831 Surgical operation with implant of artificial internal device as the cause of abnormal reaction of the patient, or of later complication, without mention of misadventure at the time of the procedure: Secondary | ICD-10-CM | POA: Diagnosis present

## 2021-06-13 DIAGNOSIS — Z743 Need for continuous supervision: Secondary | ICD-10-CM | POA: Diagnosis not present

## 2021-06-13 DIAGNOSIS — R778 Other specified abnormalities of plasma proteins: Secondary | ICD-10-CM | POA: Diagnosis present

## 2021-06-13 DIAGNOSIS — Z95820 Peripheral vascular angioplasty status with implants and grafts: Secondary | ICD-10-CM

## 2021-06-13 DIAGNOSIS — Z7982 Long term (current) use of aspirin: Secondary | ICD-10-CM

## 2021-06-13 DIAGNOSIS — R000 Tachycardia, unspecified: Secondary | ICD-10-CM | POA: Diagnosis not present

## 2021-06-13 DIAGNOSIS — R739 Hyperglycemia, unspecified: Secondary | ICD-10-CM | POA: Diagnosis not present

## 2021-06-13 DIAGNOSIS — N186 End stage renal disease: Secondary | ICD-10-CM | POA: Diagnosis present

## 2021-06-13 DIAGNOSIS — N2581 Secondary hyperparathyroidism of renal origin: Secondary | ICD-10-CM | POA: Diagnosis present

## 2021-06-13 LAB — BASIC METABOLIC PANEL
Anion gap: 16 — ABNORMAL HIGH (ref 5–15)
BUN: 60 mg/dL — ABNORMAL HIGH (ref 8–23)
CO2: 22 mmol/L (ref 22–32)
Calcium: 9.5 mg/dL (ref 8.9–10.3)
Chloride: 97 mmol/L — ABNORMAL LOW (ref 98–111)
Creatinine, Ser: 7.36 mg/dL — ABNORMAL HIGH (ref 0.44–1.00)
GFR, Estimated: 6 mL/min — ABNORMAL LOW (ref >60)
Glucose, Bld: 341 mg/dL — ABNORMAL HIGH (ref 70–99)
Potassium: 3.7 mmol/L (ref 3.5–5.1)
Sodium: 135 mmol/L (ref 135–145)

## 2021-06-13 LAB — TROPONIN I (HIGH SENSITIVITY): Troponin I (High Sensitivity): 55 ng/L — ABNORMAL HIGH (ref <18)

## 2021-06-13 LAB — CBC
HCT: 39.4 % (ref 36.0–46.0)
Hemoglobin: 12.9 g/dL (ref 12.0–15.0)
MCH: 34.3 pg — ABNORMAL HIGH (ref 26.0–34.0)
MCHC: 32.7 g/dL (ref 30.0–36.0)
MCV: 104.8 fL — ABNORMAL HIGH (ref 80.0–100.0)
Platelets: 180 10*3/uL (ref 150–400)
RBC: 3.76 MIL/uL — ABNORMAL LOW (ref 3.87–5.11)
RDW: 16 % — ABNORMAL HIGH (ref 11.5–15.5)
WBC: 12.6 10*3/uL — ABNORMAL HIGH (ref 4.0–10.5)
nRBC: 0 % (ref 0.0–0.2)

## 2021-06-13 NOTE — ED Triage Notes (Addendum)
Chest pain since 5:30pm Hx of reflux 4 aspirin and 4 nitro at home prior to ems arrival.   Ems gave 1in ntg paste and odt zofran.  Right arm restriction

## 2021-06-14 ENCOUNTER — Other Ambulatory Visit (HOSPITAL_COMMUNITY): Payer: Medicare HMO

## 2021-06-14 ENCOUNTER — Encounter (HOSPITAL_COMMUNITY)
Admission: EM | Disposition: A | Discharge status: Home / Self Care | Payer: Self-pay | Source: Home / Self Care | Attending: Internal Medicine

## 2021-06-14 ENCOUNTER — Encounter (HOSPITAL_COMMUNITY): Payer: Self-pay | Admitting: Internal Medicine

## 2021-06-14 ENCOUNTER — Other Ambulatory Visit: Payer: Self-pay

## 2021-06-14 DIAGNOSIS — I1311 Hypertensive heart and chronic kidney disease without heart failure, with stage 5 chronic kidney disease, or end stage renal disease: Secondary | ICD-10-CM | POA: Diagnosis not present

## 2021-06-14 DIAGNOSIS — N186 End stage renal disease: Secondary | ICD-10-CM | POA: Diagnosis not present

## 2021-06-14 DIAGNOSIS — D631 Anemia in chronic kidney disease: Secondary | ICD-10-CM | POA: Diagnosis not present

## 2021-06-14 DIAGNOSIS — J810 Acute pulmonary edema: Secondary | ICD-10-CM

## 2021-06-14 DIAGNOSIS — I214 Non-ST elevation (NSTEMI) myocardial infarction: Secondary | ICD-10-CM

## 2021-06-14 DIAGNOSIS — E8889 Other specified metabolic disorders: Secondary | ICD-10-CM | POA: Diagnosis not present

## 2021-06-14 DIAGNOSIS — K219 Gastro-esophageal reflux disease without esophagitis: Secondary | ICD-10-CM | POA: Diagnosis not present

## 2021-06-14 DIAGNOSIS — Z95820 Peripheral vascular angioplasty status with implants and grafts: Secondary | ICD-10-CM

## 2021-06-14 DIAGNOSIS — J811 Chronic pulmonary edema: Secondary | ICD-10-CM

## 2021-06-14 DIAGNOSIS — I959 Hypotension, unspecified: Secondary | ICD-10-CM | POA: Diagnosis not present

## 2021-06-14 DIAGNOSIS — I2510 Atherosclerotic heart disease of native coronary artery without angina pectoris: Secondary | ICD-10-CM | POA: Diagnosis not present

## 2021-06-14 DIAGNOSIS — N250 Renal osteodystrophy: Secondary | ICD-10-CM | POA: Diagnosis not present

## 2021-06-14 DIAGNOSIS — E669 Obesity, unspecified: Secondary | ICD-10-CM

## 2021-06-14 DIAGNOSIS — I120 Hypertensive chronic kidney disease with stage 5 chronic kidney disease or end stage renal disease: Secondary | ICD-10-CM | POA: Diagnosis not present

## 2021-06-14 DIAGNOSIS — E782 Mixed hyperlipidemia: Secondary | ICD-10-CM

## 2021-06-14 DIAGNOSIS — R778 Other specified abnormalities of plasma proteins: Secondary | ICD-10-CM

## 2021-06-14 DIAGNOSIS — Z20822 Contact with and (suspected) exposure to covid-19: Secondary | ICD-10-CM | POA: Diagnosis not present

## 2021-06-14 DIAGNOSIS — N2581 Secondary hyperparathyroidism of renal origin: Secondary | ICD-10-CM | POA: Diagnosis present

## 2021-06-14 DIAGNOSIS — R718 Other abnormality of red blood cells: Secondary | ICD-10-CM

## 2021-06-14 DIAGNOSIS — Z951 Presence of aortocoronary bypass graft: Secondary | ICD-10-CM

## 2021-06-14 DIAGNOSIS — E039 Hypothyroidism, unspecified: Secondary | ICD-10-CM | POA: Diagnosis not present

## 2021-06-14 DIAGNOSIS — J45909 Unspecified asthma, uncomplicated: Secondary | ICD-10-CM | POA: Diagnosis present

## 2021-06-14 DIAGNOSIS — I25810 Atherosclerosis of coronary artery bypass graft(s) without angina pectoris: Secondary | ICD-10-CM

## 2021-06-14 DIAGNOSIS — E1165 Type 2 diabetes mellitus with hyperglycemia: Secondary | ICD-10-CM

## 2021-06-14 DIAGNOSIS — Z992 Dependence on renal dialysis: Secondary | ICD-10-CM

## 2021-06-14 DIAGNOSIS — E1122 Type 2 diabetes mellitus with diabetic chronic kidney disease: Secondary | ICD-10-CM | POA: Diagnosis present

## 2021-06-14 DIAGNOSIS — R079 Chest pain, unspecified: Secondary | ICD-10-CM | POA: Diagnosis not present

## 2021-06-14 DIAGNOSIS — D72829 Elevated white blood cell count, unspecified: Secondary | ICD-10-CM | POA: Diagnosis not present

## 2021-06-14 DIAGNOSIS — I25110 Atherosclerotic heart disease of native coronary artery with unstable angina pectoris: Secondary | ICD-10-CM | POA: Diagnosis not present

## 2021-06-14 DIAGNOSIS — Z6835 Body mass index (BMI) 35.0-35.9, adult: Secondary | ICD-10-CM | POA: Diagnosis not present

## 2021-06-14 DIAGNOSIS — K5900 Constipation, unspecified: Secondary | ICD-10-CM | POA: Diagnosis present

## 2021-06-14 DIAGNOSIS — E1151 Type 2 diabetes mellitus with diabetic peripheral angiopathy without gangrene: Secondary | ICD-10-CM | POA: Diagnosis present

## 2021-06-14 DIAGNOSIS — I9589 Other hypotension: Secondary | ICD-10-CM | POA: Diagnosis not present

## 2021-06-14 DIAGNOSIS — T82855A Stenosis of coronary artery stent, initial encounter: Secondary | ICD-10-CM | POA: Diagnosis not present

## 2021-06-14 DIAGNOSIS — Y831 Surgical operation with implant of artificial internal device as the cause of abnormal reaction of the patient, or of later complication, without mention of misadventure at the time of the procedure: Secondary | ICD-10-CM | POA: Diagnosis not present

## 2021-06-14 HISTORY — PX: CORONARY BALLOON ANGIOPLASTY: CATH118233

## 2021-06-14 HISTORY — PX: LEFT HEART CATH AND CORS/GRAFTS ANGIOGRAPHY: CATH118250

## 2021-06-14 LAB — COMPREHENSIVE METABOLIC PANEL
ALT: 17 U/L (ref 0–44)
AST: 58 U/L — ABNORMAL HIGH (ref 15–41)
Albumin: 3.2 g/dL — ABNORMAL LOW (ref 3.5–5.0)
Alkaline Phosphatase: 75 U/L (ref 38–126)
Anion gap: 13 (ref 5–15)
BUN: 63 mg/dL — ABNORMAL HIGH (ref 8–23)
CO2: 25 mmol/L (ref 22–32)
Calcium: 9.2 mg/dL (ref 8.9–10.3)
Chloride: 98 mmol/L (ref 98–111)
Creatinine, Ser: 7.63 mg/dL — ABNORMAL HIGH (ref 0.44–1.00)
GFR, Estimated: 6 mL/min — ABNORMAL LOW (ref >60)
Glucose, Bld: 177 mg/dL — ABNORMAL HIGH (ref 70–99)
Potassium: 4.3 mmol/L (ref 3.5–5.1)
Sodium: 136 mmol/L (ref 135–145)
Total Bilirubin: 0.3 mg/dL (ref 0.3–1.2)
Total Protein: 6.7 g/dL (ref 6.5–8.1)

## 2021-06-14 LAB — VITAMIN B12: Vitamin B-12: 305 pg/mL (ref 180–914)

## 2021-06-14 LAB — GLUCOSE, CAPILLARY: Glucose-Capillary: 152 mg/dL — ABNORMAL HIGH (ref 70–99)

## 2021-06-14 LAB — MAGNESIUM: Magnesium: 2.2 mg/dL (ref 1.7–2.4)

## 2021-06-14 LAB — CBC
HCT: 37.1 % (ref 36.0–46.0)
HCT: 34.2 % — ABNORMAL LOW (ref 36.0–46.0)
Hemoglobin: 11.7 g/dL — ABNORMAL LOW (ref 12.0–15.0)
Hemoglobin: 10.9 g/dL — ABNORMAL LOW (ref 12.0–15.0)
MCH: 33.3 pg (ref 26.0–34.0)
MCH: 33.6 pg (ref 26.0–34.0)
MCHC: 31.5 g/dL (ref 30.0–36.0)
MCHC: 31.9 g/dL (ref 30.0–36.0)
MCV: 105.7 fL — ABNORMAL HIGH (ref 80.0–100.0)
MCV: 105.6 fL — ABNORMAL HIGH (ref 80.0–100.0)
Platelets: 156 10*3/uL (ref 150–400)
Platelets: 163 10*3/uL (ref 150–400)
RBC: 3.51 MIL/uL — ABNORMAL LOW (ref 3.87–5.11)
RBC: 3.24 MIL/uL — ABNORMAL LOW (ref 3.87–5.11)
RDW: 16.2 % — ABNORMAL HIGH (ref 11.5–15.5)
RDW: 16 % — ABNORMAL HIGH (ref 11.5–15.5)
WBC: 12.3 10*3/uL — ABNORMAL HIGH (ref 4.0–10.5)
WBC: 13 10*3/uL — ABNORMAL HIGH (ref 4.0–10.5)
nRBC: 0 % (ref 0.0–0.2)
nRBC: 0 % (ref 0.0–0.2)

## 2021-06-14 LAB — POCT ACTIVATED CLOTTING TIME
Activated Clotting Time: 318 seconds
Activated Clotting Time: 277 seconds
Activated Clotting Time: 184 seconds
Activated Clotting Time: 149 seconds

## 2021-06-14 LAB — TROPONIN I (HIGH SENSITIVITY)
Troponin I (High Sensitivity): 523 ng/L (ref <18)
Troponin I (High Sensitivity): 12628 ng/L (ref <18)

## 2021-06-14 LAB — HEPARIN LEVEL (UNFRACTIONATED): Heparin Unfractionated: >1.1 IU/mL — ABNORMAL HIGH (ref 0.30–0.70)

## 2021-06-14 LAB — CBG MONITORING, ED
Glucose-Capillary: 143 mg/dL — ABNORMAL HIGH (ref 70–99)
Glucose-Capillary: 182 mg/dL — ABNORMAL HIGH (ref 70–99)

## 2021-06-14 LAB — HEMOGLOBIN A1C
Hgb A1c MFr Bld: 7.2 % — ABNORMAL HIGH (ref 4.8–5.6)
Mean Plasma Glucose: 160 mg/dL

## 2021-06-14 LAB — PHOSPHORUS: Phosphorus: 7.6 mg/dL — ABNORMAL HIGH (ref 2.5–4.6)

## 2021-06-14 LAB — APTT: aPTT: >200 seconds (ref 24–36)

## 2021-06-14 LAB — PROTIME-INR
INR: 1.1 (ref 0.8–1.2)
Prothrombin Time: 14 seconds (ref 11.4–15.2)

## 2021-06-14 LAB — RESP PANEL BY RT-PCR (FLU A&B, COVID) ARPGX2
Influenza A by PCR: NEGATIVE
Influenza B by PCR: NEGATIVE
SARS Coronavirus 2 by RT PCR: NEGATIVE

## 2021-06-14 LAB — FOLATE: Folate: 51.3 ng/mL (ref >5.9)

## 2021-06-14 SURGERY — LEFT HEART CATH AND CORS/GRAFTS ANGIOGRAPHY
Anesthesia: LOCAL

## 2021-06-14 MED ORDER — EZETIMIBE 10 MG PO TABS
10.0000 mg | ORAL_TABLET | Freq: Every day | ORAL | Status: DC
  Administered 2021-06-14 – 2021-06-17 (×4): 10 mg via ORAL
  Filled 2021-06-14 (×5): qty 1

## 2021-06-14 MED ORDER — HYDRALAZINE HCL 20 MG/ML IJ SOLN
INTRAMUSCULAR | Status: AC
  Filled 2021-06-14: qty 1

## 2021-06-14 MED ORDER — IOHEXOL 350 MG/ML SOLN
INTRAVENOUS | Status: DC | PRN
  Administered 2021-06-14: 155 mL via INTRA_ARTERIAL

## 2021-06-14 MED ORDER — INSULIN ASPART 100 UNIT/ML IJ SOLN
0.0000 [IU] | Freq: Three times a day (TID) | INTRAMUSCULAR | Status: DC
Start: 2021-06-14 — End: 2021-06-17
  Administered 2021-06-14 – 2021-06-15 (×3): 1 [IU] via SUBCUTANEOUS
  Administered 2021-06-16: 2 [IU] via SUBCUTANEOUS
  Administered 2021-06-16: 3 [IU] via SUBCUTANEOUS
  Administered 2021-06-16 – 2021-06-17 (×2): 1 [IU] via SUBCUTANEOUS

## 2021-06-14 MED ORDER — LIDOCAINE HCL (PF) 1 % IJ SOLN
INTRAMUSCULAR | Status: AC
  Filled 2021-06-14: qty 30

## 2021-06-14 MED ORDER — SODIUM CHLORIDE 0.9% FLUSH
3.0000 mL | Freq: Two times a day (BID) | INTRAVENOUS | Status: DC
  Administered 2021-06-15 – 2021-06-16 (×3): 3 mL via INTRAVENOUS

## 2021-06-14 MED ORDER — MIDAZOLAM HCL 2 MG/2ML IJ SOLN
INTRAMUSCULAR | Status: DC | PRN
  Administered 2021-06-14: 1 mg via INTRAVENOUS

## 2021-06-14 MED ORDER — MIDODRINE HCL 5 MG PO TABS
5.0000 mg | ORAL_TABLET | Freq: Three times a day (TID) | ORAL | Status: DC
  Administered 2021-06-14 (×2): 5 mg via ORAL
  Filled 2021-06-14 (×2): qty 1

## 2021-06-14 MED ORDER — ALBUTEROL SULFATE (2.5 MG/3ML) 0.083% IN NEBU: 3.0000 mL | INHALATION_SOLUTION | Freq: Four times a day (QID) | RESPIRATORY_TRACT | Status: DC | PRN

## 2021-06-14 MED ORDER — HEPARIN (PORCINE) IN NACL 1000-0.9 UT/500ML-% IV SOLN
INTRAVENOUS | Status: DC | PRN
  Administered 2021-06-14 (×2): 500 mL

## 2021-06-14 MED ORDER — ASPIRIN EC 81 MG PO TBEC
81.0000 mg | DELAYED_RELEASE_TABLET | Freq: Every day | ORAL | Status: DC
  Administered 2021-06-14: 81 mg via ORAL
  Filled 2021-06-14: qty 1

## 2021-06-14 MED ORDER — NITROGLYCERIN 2 % TD OINT
1.0000 [in_us] | TOPICAL_OINTMENT | Freq: Once | TRANSDERMAL | Status: AC
  Administered 2021-06-14: 1 [in_us] via TOPICAL
  Filled 2021-06-14: qty 1

## 2021-06-14 MED ORDER — HEPARIN (PORCINE) 25000 UT/250ML-% IV SOLN
900.0000 [IU]/h | INTRAVENOUS | Status: DC
  Administered 2021-06-14: 900 [IU]/h via INTRAVENOUS
  Filled 2021-06-14: qty 250

## 2021-06-14 MED ORDER — SODIUM CHLORIDE 0.9 % IV SOLN: 250.0000 mL | INTRAVENOUS | Status: DC | PRN

## 2021-06-14 MED ORDER — ACETAMINOPHEN 325 MG PO TABS: 650.0000 mg | ORAL_TABLET | ORAL | Status: DC | PRN

## 2021-06-14 MED ORDER — HEPARIN (PORCINE) 25000 UT/250ML-% IV SOLN: 700.0000 [IU]/h | INTRAVENOUS | Status: DC

## 2021-06-14 MED ORDER — HEPARIN SODIUM (PORCINE) 1000 UNIT/ML IJ SOLN
INTRAMUSCULAR | Status: DC | PRN
  Administered 2021-06-14: 8000 [IU] via INTRAVENOUS

## 2021-06-14 MED ORDER — FENTANYL CITRATE (PF) 100 MCG/2ML IJ SOLN
INTRAMUSCULAR | Status: AC
  Filled 2021-06-14: qty 2

## 2021-06-14 MED ORDER — FENTANYL CITRATE (PF) 100 MCG/2ML IJ SOLN
INTRAMUSCULAR | Status: DC | PRN
  Administered 2021-06-14: 25 ug via INTRAVENOUS

## 2021-06-14 MED ORDER — HYDRALAZINE HCL 20 MG/ML IJ SOLN: 10.0000 mg | INTRAMUSCULAR | Status: AC | PRN

## 2021-06-14 MED ORDER — HEPARIN BOLUS VIA INFUSION
4000.0000 [IU] | Freq: Once | INTRAVENOUS | Status: AC
  Administered 2021-06-14: 4000 [IU] via INTRAVENOUS

## 2021-06-14 MED ORDER — LIDOCAINE HCL (PF) 1 % IJ SOLN
INTRAMUSCULAR | Status: DC | PRN
  Administered 2021-06-14: 15 mL via SUBCUTANEOUS

## 2021-06-14 MED ORDER — HEPARIN (PORCINE) IN NACL 1000-0.9 UT/500ML-% IV SOLN
INTRAVENOUS | Status: AC
  Filled 2021-06-14: qty 500

## 2021-06-14 MED ORDER — SODIUM CHLORIDE 0.9% FLUSH: 3.0000 mL | INTRAVENOUS | Status: DC | PRN

## 2021-06-14 MED ORDER — VERAPAMIL HCL 2.5 MG/ML IV SOLN
INTRAVENOUS | Status: AC
  Filled 2021-06-14: qty 2

## 2021-06-14 MED ORDER — MORPHINE SULFATE (PF) 2 MG/ML IV SOLN
INTRAVENOUS | Status: AC
  Filled 2021-06-14: qty 1

## 2021-06-14 MED ORDER — SODIUM CHLORIDE 0.9 % IV SOLN: INTRAVENOUS | Status: DC

## 2021-06-14 MED ORDER — LEVOTHYROXINE SODIUM 112 MCG PO TABS
112.0000 ug | ORAL_TABLET | Freq: Every day | ORAL | Status: DC
  Administered 2021-06-15 – 2021-06-17 (×3): 112 ug via ORAL
  Filled 2021-06-14 (×3): qty 1

## 2021-06-14 MED ORDER — CLOPIDOGREL BISULFATE 75 MG PO TABS
75.0000 mg | ORAL_TABLET | Freq: Every day | ORAL | Status: DC
  Administered 2021-06-14: 75 mg via ORAL
  Filled 2021-06-14: qty 1

## 2021-06-14 MED ORDER — ATORVASTATIN CALCIUM 80 MG PO TABS
80.0000 mg | ORAL_TABLET | Freq: Every evening | ORAL | Status: DC
  Administered 2021-06-15 – 2021-06-16 (×2): 80 mg via ORAL
  Filled 2021-06-14 (×2): qty 1

## 2021-06-14 MED ORDER — HEPARIN SODIUM (PORCINE) 5000 UNIT/ML IJ SOLN
5000.0000 [IU] | Freq: Three times a day (TID) | INTRAMUSCULAR | Status: DC
  Administered 2021-06-15 – 2021-06-17 (×6): 5000 [IU] via SUBCUTANEOUS
  Filled 2021-06-14 (×6): qty 1

## 2021-06-14 MED ORDER — ONDANSETRON HCL 4 MG/2ML IJ SOLN
4.0000 mg | Freq: Four times a day (QID) | INTRAMUSCULAR | Status: DC | PRN
  Administered 2021-06-16: 4 mg via INTRAVENOUS
  Filled 2021-06-14: qty 2

## 2021-06-14 MED ORDER — MORPHINE SULFATE (PF) 2 MG/ML IV SOLN
2.0000 mg | INTRAVENOUS | Status: DC | PRN
Start: 2021-06-14 — End: 2021-06-15
  Administered 2021-06-14 (×2): 2 mg via INTRAVENOUS
  Filled 2021-06-14: qty 1

## 2021-06-14 MED ORDER — MORPHINE SULFATE (PF) 4 MG/ML IV SOLN
4.0000 mg | Freq: Once | INTRAVENOUS | Status: AC
  Administered 2021-06-14: 4 mg via INTRAVENOUS
  Filled 2021-06-14: qty 1

## 2021-06-14 MED ORDER — ASPIRIN 81 MG PO CHEW
324.0000 mg | CHEWABLE_TABLET | Freq: Once | ORAL | Status: AC
  Administered 2021-06-14: 324 mg via ORAL
  Filled 2021-06-14: qty 4

## 2021-06-14 MED ORDER — HEPARIN SODIUM (PORCINE) 1000 UNIT/ML IJ SOLN
INTRAMUSCULAR | Status: AC
  Filled 2021-06-14: qty 1

## 2021-06-14 MED ORDER — INSULIN ASPART 100 UNIT/ML IJ SOLN
0.0000 [IU] | Freq: Every day | INTRAMUSCULAR | Status: DC
  Administered 2021-06-16: 1 [IU] via SUBCUTANEOUS

## 2021-06-14 MED ORDER — LABETALOL HCL 5 MG/ML IV SOLN: 10.0000 mg | INTRAVENOUS | Status: AC | PRN

## 2021-06-14 MED ORDER — ASPIRIN 81 MG PO CHEW
81.0000 mg | CHEWABLE_TABLET | Freq: Every day | ORAL | Status: DC
  Administered 2021-06-15 – 2021-06-17 (×3): 81 mg via ORAL
  Filled 2021-06-14 (×3): qty 1

## 2021-06-14 MED ORDER — HYDRALAZINE HCL 20 MG/ML IJ SOLN
INTRAMUSCULAR | Status: DC | PRN
  Administered 2021-06-14: 10 mg via INTRAVENOUS

## 2021-06-14 MED ORDER — CLOPIDOGREL BISULFATE 75 MG PO TABS
75.0000 mg | ORAL_TABLET | Freq: Every day | ORAL | Status: DC
  Administered 2021-06-15 – 2021-06-17 (×3): 75 mg via ORAL
  Filled 2021-06-14 (×3): qty 1

## 2021-06-14 MED ORDER — MIDAZOLAM HCL 2 MG/2ML IJ SOLN
INTRAMUSCULAR | Status: AC
  Filled 2021-06-14: qty 2

## 2021-06-14 SURGICAL SUPPLY — 18 items
BALLN SAPPHIRE ~~LOC~~ 4.0X12 (BALLOONS) ×1 IMPLANT
BALLN SCOREFLEX 3.50X15 (BALLOONS) ×2
BALLOON SCOREFLEX 3.50X15 (BALLOONS) IMPLANT
CATH INFINITI 5FR JL5 (CATHETERS) ×1 IMPLANT
CATH INFINITI 5FR MULTPACK ANG (CATHETERS) ×1 IMPLANT
CATHETER LAUNCHER 6FR MP1 (CATHETERS) ×1 IMPLANT
KIT ENCORE 26 ADVANTAGE (KITS) ×1 IMPLANT
KIT HEART LEFT (KITS) ×2 IMPLANT
KIT HEMO VALVE WATCHDOG (MISCELLANEOUS) ×1 IMPLANT
PACK CARDIAC CATHETERIZATION (CUSTOM PROCEDURE TRAY) ×2 IMPLANT
SHEATH PINNACLE 5F 10CM (SHEATH) ×1 IMPLANT
SHEATH PINNACLE 6F 10CM (SHEATH) ×1 IMPLANT
TRANSDUCER W/STOPCOCK (MISCELLANEOUS) ×2 IMPLANT
TUBING CIL FLEX 10 FLL-RA (TUBING) ×2 IMPLANT
WIRE ASAHI PROWATER 180CM (WIRE) ×1 IMPLANT
WIRE HI TORQ BMW 190CM (WIRE) ×1 IMPLANT
WIRE HI TORQ VERSACORE-J 145CM (WIRE) ×1 IMPLANT
WIRE J 3MM .035X145CM (WIRE) ×1 IMPLANT

## 2021-06-14 NOTE — Progress Notes (Signed)
ANTICOAGULATION CONSULT NOTE - Follow Up Consult  Pharmacy Consult for Heparin  Indication: chest pain/ACS  Allergies  Allergen Reactions   Penicillins Other (See Comments)    REACTION: Unknown, told as a child Has patient had a PCN reaction causing immediate rash, facial/tongue/throat swelling, SOB or lightheadedness with hypotension: Unknown Has patient had a PCN reaction causing severe rash involving mucus membranes or skin necrosis: Unknown Has patient had a PCN reaction that required hospitalization: Unknown Has patient had a PCN reaction occurring within the last 10 years: No If all of the above answers are "NO", then may proceed with Cephalosporin use.    Beta Adrenergic Blockers Other (See Comments)    Hypotension    Patient Measurements: Height: 5\' 6"  (167.6 cm) Weight: 99.8 kg (220 lb) IBW/kg (Calculated) : 59.3  Vital Signs: Temp: 97.7 F (36.5 C) (09/27 0631) Temp Source: Oral (09/27 0631) BP: 81/70 (09/27 0745) Pulse Rate: 74 (09/27 0745)  Labs: Recent Labs    06/13/21 2143 06/13/21 2320 06/14/21 0444 06/14/21 1035  HGB 12.9  --  11.7*  --   HCT 39.4  --  37.1  --   PLT 180  --  156  --   APTT  --   --  >200*  --   LABPROT  --   --  14.0  --   INR  --   --  1.1  --   HEPARINUNFRC  --   --   --  >1.10*  CREATININE 7.36*  --  7.63*  --   TROPONINIHS 55* 523* 12,628*  --      Estimated Creatinine Clearance: 9.1 mL/min (A) (by C-G formula based on SCr of 7.63 mg/dL (H)).   Medical History: Past Medical History:  Diagnosis Date   Anemia    Anxiety    Asthma    CAD (coronary artery disease)    Multivessel s/p CABG 2005, numerous PCIs since that time and documented graft disease   Carotid artery disease ([place])    R CEA   ESRD on hemodialysis ([place])    Essential hypertension    Gout    History of blood transfusion    Hyperlipidemia    Hypothyroidism    Myocardial infarction [facility])    PAD (peripheral artery disease) ([place])    Dr. Kellie Simmering    Pneumonia 09/2019, 11/2019   S/P angioplasty with stent- DES to mRCA and to LIMA to LAD with DES 04/09/18.   04/10/2018   SBO (small bowel obstruction) ([place]) 2011   Status post lysis of adhesions & hernia repair   Sinus bradycardia    Type 2 diabetes mellitus ([place])    Umbilical hernia    Assessment: 65 y/o F with significant cardiac history to start heparin drip for CP/elevated troponin. No anti-coagulation noted PTA. CBC good. ESRD on HD.   Heparin level returned > 1.10. Spoke to RN and lab (who drew it) and stated no issues upon drawing, drawn from below heparin site, no s/sx of bleeding.  Goal of Therapy:  Heparin level 0.3-0.7 units/ml Monitor platelets by anticoagulation protocol: Yes   Plan: Hold heparin infusion for 1 hour and resume at heparin drip at 700 units/hr at 1400 F/u 8h HL Monitor daily CBC/Heparin level Monitor for bleeding  Joetta Manners, PharmD, [facility] Emergency Medicine Clinical Pharmacist ED RPh Phone: [place]: [phone]

## 2021-06-14 NOTE — ED Notes (Signed)
Pt to [facility] ED via [place].

## 2021-06-14 NOTE — H&P (View-Only) (Signed)
Cardiology Consult:   Patient ID: Donna Howe MRN: [id number]; DOB: 1955/09/28   Admission date: 06/13/2021  PCP:  Curlene Labrum, MD   [facility] Providers Cardiologist:  Rozann Lesches, MD        Chief Complaint:  NSTEMI  Patient Profile:   Donna Howe is a 65 y.o. female with  history of CAD (s/p CABG in 2005 with multiple PCI's since including PTCA to SVG-OM1 in 01/2004, PTCA/DES to LM and prox LCx in 03/2004 and DES to SVG-OM1.   S/p PTCA/DES to SVG-OM in 01/2011, PTCA/DES to LM and LCx in 2015, cutting balloon angioplasty to prox Cx and DES to SVG-RCA in 05/2017, NSTEMI in 03/2018 with DES to mid-RCA and DES to LIMA-LAD.   NSTEMI in 06/2020 and medical management recommended as she was felt to be high-risk for re-do CABG and poor target vessels, s/p NSTEMI in 12/2020 with DESx2 to SVG-RCA).  HTN, HLD, Type 2 DM, carotid artery stenosis (s/p R CEA in 2014), left subclavian stenosis (s/p stent in 07/2020) and ESRD  NSTEMI 02/2021 s/p scoring balloon angioplasty and DES to the SVG-PDA, also with severe ISR subclavian stent >> PTA & stent.  DAPT indefinitely. 04/2021 s/p Rt brachial artery cephalic vein fistula creation for dialysis.  She is being seen 06/14/2021 for the evaluation of NSTEMI at the request of Dr. Cathlean Sauer.  Intolerant to beta-blockers and Imdur.  Problems w/ pulling off fluid at HD due to low BP.  History of Present Illness:   [name] was initially not having CP after she went home after her last PCI.  She started having CP episodes again about the first of September. At first, the episodes were occasional and relieved by SL NTG. The episodes gradually increased in frequency and intensity. 5/10.  ER visit 05/28/2021 for chest pain, troponin 18-19, no other significant abnormalities.  DC home.  On 09/14, she was seen in the office. Low BP has been a problem, as well as ongoing angina. She had midodrine added and was continued on Ranexa once a  day. Bid Ranexa caused dizziness. She takes it at night to minimize the side effects and not interfere w/ HD.  After that office visit, the symptoms increased in frequency but not intensity....Marland Kitchenuntil yesterday.  Yesterday, she had chest pain all day. She had woken with it the night before and could not get comfortable. Unable to lie on L side.  The pain reached a 12/10 at times, someone sitting on her chest. She took nitro off/on all day with temporary improvement but not relief.   Last pm, it was just too much, she called EMS. She got ASA 324 MG, morphine, nitro ointment, heparin.   The pain finally went away.  This am, she has some chest discomfort, but not like yesterday. 3/10 right now.    Her BP has been low but she is currently asymptomatic. SBP 70s >> nitro paste removed. SBP currently 86.  Historically, she has problems w/ low BP, can take it in L arm and both legs. HD fistula on R, subclavian stent on L along with previous left antecubital fistula.   Past Medical History:  Diagnosis Date   Anemia    Anxiety    Asthma    CAD (coronary artery disease)    Multivessel s/p CABG 2005, numerous PCIs since that time and documented graft disease   Carotid artery disease ([place])    R CEA   ESRD on hemodialysis ([place])    Essential  hypertension    Gout    History of blood transfusion    Hyperlipidemia    Hypothyroidism    Myocardial infarction [facility])    PAD (peripheral artery disease) ([place])    Dr. Kellie Simmering   Pneumonia 09/2019, 11/2019   S/P angioplasty with stent- DES to mRCA and to LIMA to LAD with DES 04/09/18.   04/10/2018   SBO (small bowel obstruction) ([place]) 2011   Status post lysis of adhesions & hernia repair   Sinus bradycardia    Type 2 diabetes mellitus ([place])    Umbilical hernia     Past Surgical History:  Procedure Laterality Date   AORTIC ARCH ANGIOGRAPHY N/A 08/02/2020   Procedure: AORTIC ARCH ANGIOGRAPHY;  Surgeon: Waynetta Sandy, MD;  Location: [place] CV LAB;  Service: Cardiovascular;  Laterality: N/A;  Lt upper extermity   AV FISTULA PLACEMENT Left 06/29/2020   Procedure: LEFT ARM ARTERIOVENOUS (AV) FISTULA;  Surgeon: Waynetta Sandy, MD;  Location: [place];  Service: Vascular;  Laterality: Left;  ARM   AV FISTULA PLACEMENT Right 05/11/2021   Procedure: RIGHT BRACHIOCEPHALIC ARTERIOVENOUS (AV) FISTULA CREATION;  Surgeon: Waynetta Sandy, MD;  Location: [facility] OR;  Service: Vascular;  Laterality: Right;   [place]  2010   CORONARY ARTERY BYPASS GRAFT  2005   CORONARY BALLOON ANGIOPLASTY N/A 05/31/2017   Procedure: CORONARY BALLOON ANGIOPLASTY;  Surgeon: Jettie Booze, MD;  Location: [place] CV LAB;  Service: Cardiovascular;  Laterality: N/A;   CORONARY STENT INTERVENTION N/A 05/31/2017   Procedure: CORONARY STENT INTERVENTION;  Surgeon: Jettie Booze, MD;  Location: [place] CV LAB;  Service: Cardiovascular;  Laterality: N/A;   CORONARY STENT INTERVENTION N/A 04/09/2018   Procedure: CORONARY STENT INTERVENTION;  Surgeon: Jettie Booze, MD;  Location: [place] CV LAB;  Service: Cardiovascular;  Laterality: N/A;  SVG RCA   CORONARY STENT INTERVENTION N/A 01/10/2021   Procedure: CORONARY STENT INTERVENTION;  Surgeon: Leonie Man, MD;  Location: [place] CV LAB;  Service: Cardiovascular;  Laterality: N/A;   CORONARY/GRAFT ACUTE MI REVASCULARIZATION N/A 02/24/2021   Procedure: Coronary/Graft Acute MI Revascularization;  Surgeon: Jettie Booze, MD;  Location: [place] CV LAB;  Service: Cardiovascular;  Laterality: N/A;   ENDARTERECTOMY Right 04/18/2013   Procedure: ENDARTERECTOMY CAROTID;  Surgeon: Mal Misty, MD;  Location: [place];  Service: Vascular;  Laterality: Right;   HERNIA REPAIR  1989   Incisional hernia repair x2  03/04/2010   Laparoscopic with 35cm mesh by Dr Ronnald Collum   IR [place] CV LINE RIGHT  06/21/2020   IR US GUIDE VASC ACCESS  RIGHT  06/21/2020   LEFT HEART CATH AND CORS/GRAFTS ANGIOGRAPHY N/A 05/31/2017   Procedure: LEFT HEART CATH AND CORS/GRAFTS ANGIOGRAPHY;  Surgeon: Jettie Booze, MD;  Location: [place] CV LAB;  Service: Cardiovascular;  Laterality: N/A;   LEFT HEART CATH AND CORS/GRAFTS ANGIOGRAPHY N/A 04/08/2018   Procedure: LEFT HEART CATH AND CORS/GRAFTS ANGIOGRAPHY;  Surgeon: Jettie Booze, MD;  Location: [place] CV LAB;  Service: Cardiovascular;  Laterality: N/A;   LEFT HEART CATH AND CORS/GRAFTS ANGIOGRAPHY N/A 06/22/2020   Procedure: LEFT HEART CATH AND CORS/GRAFTS ANGIOGRAPHY;  Surgeon: Belva Crome, MD;  Location: [place] CV LAB;  Service: Cardiovascular;  Laterality: N/A;   LEFT HEART CATH AND CORS/GRAFTS ANGIOGRAPHY N/A 01/10/2021   Procedure: LEFT HEART CATH AND CORS/GRAFTS ANGIOGRAPHY;  Surgeon: Leonie Man, MD;  Location: [facility]  INVASIVE CV LAB;  Service: Cardiovascular;  Laterality: N/A;   LEFT HEART CATH AND CORS/GRAFTS ANGIOGRAPHY N/A 02/24/2021   Procedure: LEFT HEART CATH AND CORS/GRAFTS ANGIOGRAPHY;  Surgeon: Jettie Booze, MD;  Location: [place] CV LAB;  Service: Cardiovascular;  Laterality: N/A;   LEFT HEART CATHETERIZATION WITH CORONARY ANGIOGRAM N/A 12/19/2012   Procedure: LEFT HEART CATHETERIZATION WITH CORONARY ANGIOGRAM;  Surgeon: Josue Hector, MD;  Location: [facility] CATH LAB;  Service: Cardiovascular;  Laterality: N/A;   LEFT HEART CATHETERIZATION WITH CORONARY/GRAFT ANGIOGRAM N/A 04/19/2013   Procedure: LEFT HEART CATHETERIZATION WITH Beatrix Fetters;  Surgeon: Lorretta Harp, MD;  Location: [facility] CATH LAB;  Service: Cardiovascular;  Laterality: N/A;   LIGATION OF ARTERIOVENOUS  FISTULA Left 09/15/2020   Procedure: LIGATION OF LEFT ARM ARTERIOVENOUS  FISTULA;  Surgeon: Waynetta Sandy, MD;  Location: [place];  Service: Vascular;  Laterality: Left;   PATCH ANGIOPLASTY Right 04/18/2013   Procedure: PATCH ANGIOPLASTY Right Internal Carotid Artery;   Surgeon: Mal Misty, MD;  Location: [place];  Service: Vascular;  Laterality: Right;   PERCUTANEOUS CORONARY STENT INTERVENTION (PCI-S) Right 12/19/2012   Procedure: PERCUTANEOUS CORONARY STENT INTERVENTION (PCI-S);  Surgeon: Josue Hector, MD;  Location: [facility] CATH LAB;  Service: Cardiovascular;  Laterality: Right;   PERIPHERAL VASCULAR INTERVENTION Left 08/02/2020   Procedure: PERIPHERAL VASCULAR INTERVENTION;  Surgeon: Waynetta Sandy, MD;  Location: [place] CV LAB;  Service: Cardiovascular;  Laterality: Left;  Left subclavian   PERIPHERAL VASCULAR INTERVENTION  02/24/2021   Procedure: PERIPHERAL VASCULAR INTERVENTION;  Surgeon: Marty Heck, MD;  Location: [place] CV LAB;  Service: Vascular;;   SHOULDER SURGERY       Medications Prior to Admission: Prior to Admission medications   Medication Sig Start Date End Date Taking? Authorizing Provider  Albuterol Sulfate 108 (90 Base) MCG/ACT AEPB Inhale 2 puffs into the lungs every 6 (six) hours as needed (Shortness of breath).    [provider]  allopurinol (ZYLOPRIM) 100 MG tablet Take one tablet after hemodialysis, on Tuesday, Thursday and Saturday. Patient taking differently: Take 100 mg by mouth Every Tuesday,Thursday,and Saturday with dialysis. After dialysis 06/30/20   Arrien, Jimmy Picket, MD  ALPRAZolam Duanne Moron) 0.5 MG tablet Take 0.25-0.5 mg by mouth See admin instructions. Take 0.25 in the morning and evening and 0.5 mg at bedtime    [provider]  aspirin EC 81 MG tablet Take 81 mg by mouth daily.     [provider]  atorvastatin (LIPITOR) 80 MG tablet Take 1 tablet (80 mg total) by mouth every evening. Patient taking differently: Take 80 mg by mouth at bedtime. 12/19/19   Johnson, Clanford L, MD  clopidogrel (PLAVIX) 75 MG tablet Take 1 tablet (75 mg total) by mouth daily. 08/27/18   Satira Sark, MD  diclofenac Sodium (VOLTAREN) 1 % GEL Apply 2-4 g topically daily as needed  (FOR NECK PAIN).    [provider]  ezetimibe (ZETIA) 10 MG tablet Take 1 tablet (10 mg total) by mouth daily. Patient taking differently: Take 10 mg by mouth at bedtime. 02/25/21   Bhagat, Crista Luria, PA  ferric citrate (AURYXIA) 1 GM 210 MG(Fe) tablet Take 420 mg by mouth 3 (three) times daily with meals.    [provider]  gabapentin (NEURONTIN) 400 MG capsule Take 1 capsule (400 mg total) by mouth 2 (two) times daily. 06/17/20   Barton Dubois, MD  ipratropium (ATROVENT HFA) 17 MCG/ACT inhaler Inhale 2 puffs into the  lungs every 4 (four) hours as needed for wheezing.     [provider]  levothyroxine (SYNTHROID) 112 MCG tablet Take 112 mcg by mouth daily before breakfast.    [provider]  Methoxy PEG-Epoetin Beta (MIRCERA IJ) Mircera 05/28/21 05/27/22  [provider]  midodrine (PROAMATINE) 5 MG tablet Take 1 tablet (5 mg total) by mouth 3 (three) times daily with meals. 06/01/21   Isaiah Serge, NP  multivitamin (RENA-VIT) TABS tablet Take 1 tablet by mouth daily.    [provider]  nitroGLYCERIN (NITROSTAT) 0.4 MG SL tablet Place 1 tablet (0.4 mg total) under the tongue every 5 (five) minutes x 3 doses as needed for chest pain. 02/25/21   Bhagat, Bhavinkumar, PA  NOVOLIN 70/30 FLEXPEN (70-30) 100 UNIT/ML KwikPen Inject 24 Units into the skin in the morning and at bedtime. 06/30/20   [provider]  ondansetron (ZOFRAN) 8 MG tablet Take 8 mg by mouth every 8 (eight) hours as needed for nausea. 07/12/20   [provider]  [facility] ULTRA test strip Check blood glucose THREE TIMES DAILY 03/31/21   [provider]  oxyCODONE-acetaminophen (PERCOCET) 5-325 MG tablet Take 1 tablet by mouth every 6 (six) hours as needed for severe pain. Patient not taking: No sig reported 05/11/21 05/11/22  Baglia, Corrina, PA-C  pantoprazole (PROTONIX) 40 MG tablet Take 1 tablet (40 mg total) by mouth 2 (two) times daily. 06/17/20    Barton Dubois, MD  ranolazine (RANEXA) 500 MG 12 hr tablet Take 1 tablet (500 mg total) by mouth 2 (two) times daily. 07/09/20   Verta Ellen., NP  Vitamin D, Ergocalciferol, (DRISDOL) 1.25 MG (50000 UNIT) CAPS capsule Take 50,000 Units by mouth every Sunday.  12/31/19   [provider]     Allergies:    Allergies  Allergen Reactions   Penicillins Other (See Comments)    REACTION: Unknown, told as a child Has patient had a PCN reaction causing immediate rash, facial/tongue/throat swelling, SOB or lightheadedness with hypotension: Unknown Has patient had a PCN reaction causing severe rash involving mucus membranes or skin necrosis: Unknown Has patient had a PCN reaction that required hospitalization: Unknown Has patient had a PCN reaction occurring within the last 10 years: No If all of the above answers are "NO", then may proceed with Cephalosporin use.    Beta Adrenergic Blockers Other (See Comments)    Hypotension    Social History:   Social History   Socioeconomic History   Marital status: Divorced    Spouse name: Not on file   Number of children: Not on file   Years of education: Not on file   Highest education level: Not on file  Occupational History   Occupation: Disabled  Tobacco Use   Smoking status: Former    Packs/day: 1.00    Years: 20.00    Pack years: 20.00    Types: Cigarettes    Quit date: 12/10/2012    Years since quitting: 8.5   Smokeless tobacco: Never  Vaping Use   Vaping Use: Never used  Substance and Sexual Activity   Alcohol use: No    Alcohol/week: 0.0 standard drinks   Drug use: No   Sexual activity: Not Currently  Other Topics Concern   Not on file  Social History Narrative   Lives with mother.   Social Determinants of Health   Financial Resource Strain: Not on file  Food Insecurity: Not on file  Transportation Needs: Not on file  Physical Activity: Not on file  Stress: Not on file  Social Connections: Not on file   Intimate Partner Violence: Not on file    Family History:   The patient's family history includes AAA (abdominal aortic aneurysm) in her father; Diabetes in her mother; Heart disease in her brother and mother; Hyperlipidemia in her mother and son; Hypertension in her brother, father, mother, and son; Thyroid disease in her father.    ROS:  Please see the history of present illness. All other ROS reviewed and negative.     Physical Exam/Data:   Vitals:   06/14/21 0530 06/14/21 0631 06/14/21 0732 06/14/21 0745  BP: 111/60 115/62 (!) 83/60 (!) 81/70  Pulse: 79 75 75 74  Resp: 19 (!) 22 19 19   Temp:  97.7 F (36.5 C)    TempSrc:  Oral    SpO2: 97% 99% 92% 91%  Weight:      Height:       No intake or output data in the 24 hours ending 06/14/21 1006 Last 3 Weights 06/13/2021 06/01/2021 05/28/2021  Weight (lbs) 220 lb 214 lb 9.6 oz 210 lb  Weight (kg) 99.791 kg 97.342 kg 95.255 kg     Body mass index is 35.51 kg/m.  General:  Well nourished, well developed, in no acute distress HEENT: normal Neck: JVD somewhat elevated but difficult to assess secondary to body habitus Vascular: No carotid bruits; Distal pulses 1-2+ bilaterally   Cardiac:  normal S1, S2; RRR; no murmur  Lungs: Essentially  clear to auscultation bilaterally, mild wheezing, rhonchi or rales  Abd: soft, nontender, no hepatomegaly  Ext: no edema Musculoskeletal:  No deformities, BUE and BLE strength normal and equal Skin: warm and dry  Neuro:  CNs 2-12 intact, no focal abnormalities noted Psych:  Normal affect    EKG:  The ECG that was done today was personally reviewed and demonstrates this rhythm, heart rate 89, diffuse ST depression  Relevant CV Studies:  CARDIAC CATH: 02/24/2021 Ost LM to Dist LM lesion is 95% stenosed. LIMA to LAD is patent. Prox LAD to Mid LAD lesion is 100% stenosed. Known from prior catheterization. Unable to advance left catheter through the severely diseased subclavian stent. Ost LAD to  Prox LAD lesion is 100% stenosed. 1st Mrg-1 lesion is 99% stenosed. SVG to OM occluded. 1st Mrg-2 lesion is 100% stenosed. Ost Cx to Mid Cx lesion is 99% stenosed. Origin to Prox Graft lesion before 1st Mrg is 100% stenosed. LIMA and is normal in caliber. Ostial LIMA stent is 60% narrowed. Prox RCA lesion is 100% stenosed. SVG to PDA showed prox Graft lesion is 75% stenosed. Scoring balloon angioplasty was performed using a BALLOON SCOREFLEX 3.0X10. Multipurpose guide catheter was able to track through the subclavian stent. Post intervention, there is a 0% residual stenosis. SVG to PDA showed mid Graft lesion is 80% stenosed. A drug-eluting stent was successfully placed using a SYNERGY XD 3.50X16. Post intervention, there is a 0% residual stenosis. LV end diastolic pressure is moderately elevated. There is no aortic valve stenosis. Origin lesion is 20% stenosed.   Due to the severe in-stent restenosis of the subclavian stent, Dr. Carlis Abbott was called.  I suspect this was causing anterior wall ischemia due to compromising flow into the LIMA.  There was a 30 mm gradient across the stent.  There was clear difficulty getting equipment across the stent to the heart as noted in the report.  He is planning to place a covered stent over the  disease to bare-metal stent which was previously placed.   From a cardiac standpoint, continue aggressive secondary prevention.  She will need dual antiplatelet therapy.  Intervention     ECHO: Ordered  ECHO: 06/19/2020  1. Left ventricular ejection fraction, by estimation, is 55 to 60%. The  left ventricle has normal function. The left ventricle has no regional  wall motion abnormalities.   2. The mitral valve is normal in structure. Mild to moderate mitral valve  regurgitation. No evidence of mitral stenosis.   3. Limited echocardiogram to reevalaute LV function and wall motion.   Laboratory Data:  High Sensitivity Troponin:   Recent Labs  Lab  05/28/21 1129 05/28/21 1320 06/13/21 2143 06/13/21 2320 06/14/21 0444  TROPONINIHS 18* 19* 55* 523* 12,628*      Chemistry Recent Labs  Lab 06/13/21 2143 06/14/21 0444  NA 135 136  K 3.7 4.3  CL 97* 98  CO2 22 25  GLUCOSE 341* 177*  BUN 60* 63*  CREATININE 7.36* 7.63*  CALCIUM 9.5 9.2  MG  --  2.2  GFRNONAA 6* 6*  ANIONGAP 16* 13    Recent Labs  Lab 06/14/21 0444  PROT 6.7  ALBUMIN 3.2*  AST 58*  ALT 17  ALKPHOS 75  BILITOT 0.3   Lipids  Lab Results  Component Value Date   CHOL 168 02/23/2021   HDL 41 02/23/2021   LDLCALC 76 02/23/2021   TRIG 257 (H) 02/23/2021   CHOLHDL 4.1 02/23/2021    Hematology Recent Labs  Lab 06/13/21 2143 06/14/21 0444  WBC 12.6* 12.3*  RBC 3.76* 3.51*  HGB 12.9 11.7*  HCT 39.4 37.1  MCV 104.8* 105.7*  MCH 34.3* 33.3  MCHC 32.7 31.5  RDW 16.0* 16.2*  PLT 180 156   Thyroid No results for input(s): TSH, FREET4 in the last 168 hours. BNPNo results for input(s): BNP, PROBNP in the last 168 hours.  DDimer No results for input(s): DDIMER in the last 168 hours.   Radiology/Studies:  DG Chest 2 View  Result Date: 06/13/2021 CLINICAL DATA:  Chest pain EXAM: CHEST - 2 VIEW COMPARISON:  05/28/2021 FINDINGS: The lungs are symmetrically well expanded. Trace interstitial infiltrate has developed diffusely most in keeping with mild interstitial pulmonary edema. No pneumothorax or pleural effusion. Right internal jugular hemodialysis catheter tip noted within the right atrium. Cardiac size within normal limits. Median sternotomy has been performed. No acute bone abnormality. IMPRESSION: Interval development of mild interstitial pulmonary edema. Electronically Signed   By: Fidela Salisbury M.D.   On: 06/13/2021 22:17     Assessment and Plan:   NSTEMI: -Symptoms and cardiac enzymes at increased - Cardiac catheterization is indicated - Cardiac catheterization was discussed with the patient fully. The patient understands that risks  include but are not limited to stroke (1 in 1000), death (1 in 24), kidney failure [usually temporary] (1 in 500), bleeding (1 in 200), allergic reaction [possibly serious] (1 in 200).  The patient understands and is willing to proceed.   -Nitropaste removed because of hypotension - Use morphine as needed for pain control until the cath.  2.  ESRD on HD: - Management per IM/Nephrology - Patient feels she is volume up at this time and would normally have dialysis today. - She is not significantly volume overloaded on exam, feel we can do the cath prior to dialysis  3.  Hypotension: - Chronic problem, her blood pressure was a little higher in her left forearm, but she has an old dialysis  graft in the [facility] space and a left subclavian stent, so not sure this is accurate - She is mentating well  Otherwise, per IM  Risk Assessment/Risk Scores:    TIMI Risk Score for Unstable Angina or Non-ST Elevation MI:   The patient's TIMI risk score is  , which indicates a  % risk of all cause mortality, new or recurrent myocardial infarction or need for urgent revascularization in the next 14 days.   For questions or updates, please contact [place] Please consult [email or web address] for contact info under     Signed, Rosaria Ferries, PA-C  06/14/2021 10:06 AM

## 2021-06-14 NOTE — ED Provider Notes (Addendum)
[facility] EMERGENCY DEPARTMENT Provider Note   CSN: [id number] Arrival date & time: 06/13/21  2111     History Chief Complaint  Patient presents with   Chest Pain    Donna Howe is a 65 y.o. female.  Patient is a 65 year old female with past medical history of coronary artery disease with CABG in 2005, diabetes, hyperlipidemia, asthma, and hypertension.  She presents today with complaints of chest discomfort.  She states that this has been occurring intermittently for the past 2 days, then became worse this evening.  She describes feeling as though something is sitting on her chest and this radiates to her left arm.  She denies any nausea or diaphoresis, but does feel somewhat short of breath.  She denies fevers or chills.  She does report that this is similar to what she experienced with her prior heart pain.  Patient has had multiple stents placed since her CABG.  Patient was most recently cathed in June 2022 showing occlusions of most of her grafts and aggressive medical therapy was recommended.  The history is provided by the patient.      Past Medical History:  Diagnosis Date   Anemia    Anxiety    Asthma    CAD (coronary artery disease)    Multivessel s/p CABG 2005, numerous PCIs since that time and documented graft disease   Carotid artery disease ([place])    R CEA   ESRD on hemodialysis ([place])    Essential hypertension    Gout    History of blood transfusion    Hyperlipidemia    Hypothyroidism    Myocardial infarction [facility])    PAD (peripheral artery disease) ([place])    Dr. Kellie Simmering   Pneumonia 09/2019, 11/2019   S/P angioplasty with stent- DES to mRCA and to LIMA to LAD with DES 04/09/18.   04/10/2018   SBO (small bowel obstruction) ([place]) 2011   Status post lysis of adhesions & hernia repair   Sinus bradycardia    Type 2 diabetes mellitus ([place])    Umbilical hernia     Patient Active Problem List   Diagnosis Date Noted   Stenosis of left subclavian artery ([place])     Leukocytosis 01/09/2021   Elevated MCV 01/09/2021   Diabetic neuropathy ([place]) 01/09/2021   Unspecified protein-calorie malnutrition ([place]) 07/05/2020   Allergy, unspecified, initial encounter 06/30/2020   Anaphylactic shock, unspecified, initial encounter 06/30/2020   Anemia in chronic kidney disease 06/30/2020   Coagulation defect, unspecified ([place]) 06/30/2020   Diarrhea, unspecified 06/30/2020   Encounter for immunization 06/30/2020   Iron deficiency anemia, unspecified 06/30/2020   Pain, unspecified 06/30/2020   PAD (peripheral artery disease) ([place]) 06/30/2020   Pruritus, unspecified 06/30/2020   Secondary hyperparathyroidism of renal origin ([place]) 06/30/2020   ESRD on dialysis (HCC)    SOB (shortness of breath)    Chronic diastolic HF (heart failure) (HCC)    GERD (gastroesophageal reflux disease)    Acute renal failure superimposed on stage 5 chronic kidney disease, not on chronic dialysis ([place]) 06/13/2020   Uncontrolled type 2 diabetes mellitus with hyperglycemia, with long-term current use of insulin ([place]) 06/13/2020   Pneumonia 12/17/2019   Cough variant asthma with component of UACS 11/20/2019   Elevated troponin I level 10/02/2019   Chest pain 10/01/2019   Unstable angina ([place]) 04/18/2018   Hypothyroidism 04/18/2018   S/P angioplasty with stent- DES to mRCA and to LIMA to LAD with DES 04/09/18.   04/10/2018   Angina pectoris (  [place]) 04/05/2018   Status post coronary artery stent placement    Acute coronary syndrome ([place]) 05/31/2017   Acute chest pain    NSTEMI (non-ST elevated myocardial infarction) ([place])    History of coronary artery disease    Diabetes mellitus with ESRD (end-stage renal disease) ([place]) 10/28/2013   Hypoglycemia associated with diabetes ([place]) 10/28/2013   Thrombocytopenia, unspecified ([place]) 10/28/2013   Hypokalemia 10/27/2013   Syncope 10/26/2013   Anemia 10/26/2013   AKI (acute kidney injury) ([place]) 10/26/2013   Fracture of toe of right foot  59/93/5701   Umbilical hernia    Carotid artery disease ([place])    Occlusion and stenosis of carotid artery without mention of cerebral infarction 04/15/2013   Hx of CABG    Ejection fraction    Atherosclerosis of native artery of extremity with intermittent claudication ([place]) 02/11/2013   Diabetes mellitus with renal manifestation ([place]) 01/21/2013   Bradycardia 01/03/2013   Chronic kidney disease (CKD), stage III (moderate) (HCC)    Gout    PROTEINURIA, MILD 01/18/2010   Obesity (BMI 30-39.9) 08/26/2009   Asthma 08/26/2009   Hyperlipidemia 03/31/2007   Essential hypertension 03/31/2007   CAD (coronary artery disease) 03/31/2007    Past Surgical History:  Procedure Laterality Date   AORTIC ARCH ANGIOGRAPHY N/A 08/02/2020   Procedure: AORTIC ARCH ANGIOGRAPHY;  Surgeon: Waynetta Sandy, MD;  Location: [place] [facility];  Service: Cardiovascular;  Laterality: N/A;  Lt upper extermity   AV FISTULA PLACEMENT Left 06/29/2020   Procedure: LEFT ARM ARTERIOVENOUS (AV) FISTULA;  Surgeon: Waynetta Sandy, MD;  Location: [place];  Service: Vascular;  Laterality: Left;  ARM   AV FISTULA PLACEMENT Right 05/11/2021   Procedure: RIGHT BRACHIOCEPHALIC ARTERIOVENOUS (AV) FISTULA CREATION;  Surgeon: Waynetta Sandy, MD;  Location[facility];  Service: Vascular;  Laterality: Right;   [place]  2010   CORONARY ARTERY BYPASS GRAFT  2005   CORONARY BALLOON ANGIOPLASTY N/A 05/31/2017   Procedure: CORONARY BALLOON ANGIOPLASTY;  Surgeon: Jettie Booze, MD;  Location: [place] [facility];  Service: Cardiovascular;  Laterality: N/A;   CORONARY STENT INTERVENTION N/A 05/31/2017   Procedure: CORONARY STENT INTERVENTION;  Surgeon: Jettie Booze, MD;  Location: [place] [facility];  Service: Cardiovascular;  Laterality: N/A;   CORONARY STENT INTERVENTION N/A 04/09/2018   Procedure: CORONARY STENT INTERVENTION;  Surgeon: Jettie Booze, MD;   Location: [place] [facility];  Service: Cardiovascular;  Laterality: N/A;  SVG RCA   CORONARY STENT INTERVENTION N/A 01/10/2021   Procedure: CORONARY STENT INTERVENTION;  Surgeon: Leonie Man, MD;  Location: [place] [facility];  Service: Cardiovascular;  Laterality: N/A;   CORONARY/GRAFT ACUTE MI REVASCULARIZATION N/A 02/24/2021   Procedure: Coronary/Graft Acute MI Revascularization;  Surgeon: Jettie Booze, MD;  Location: [place] [facility];  Service: Cardiovascular;  Laterality: N/A;   ENDARTERECTOMY Right 04/18/2013   Procedure: ENDARTERECTOMY CAROTID;  Surgeon: Mal Misty, MD;  Location: [place];  Service: Vascular;  Laterality: Right;   HERNIA REPAIR  1989   Incisional hernia repair x2  03/04/2010   Laparoscopic with 35cm mesh by Dr Ronnald Collum   IR [place] CV LINE RIGHT  06/21/2020   IR US GUIDE VASC ACCESS RIGHT  06/21/2020   LEFT HEART CATH AND CORS/GRAFTS ANGIOGRAPHY N/A 05/31/2017   Procedure: LEFT HEART CATH AND CORS/GRAFTS ANGIOGRAPHY;  Surgeon: Jettie Booze, MD;  Location: [place] [facility];  Service: Cardiovascular;  Laterality: N/A;   LEFT HEART CATH AND CORS/GRAFTS ANGIOGRAPHY N/A 04/08/2018   Procedure: LEFT HEART CATH AND CORS/GRAFTS ANGIOGRAPHY;  Surgeon: Jettie Booze, MD;  Location: [place] [facility];  Service: Cardiovascular;  Laterality: N/A;   LEFT HEART CATH AND CORS/GRAFTS ANGIOGRAPHY N/A 06/22/2020   Procedure: LEFT HEART CATH AND CORS/GRAFTS ANGIOGRAPHY;  Surgeon: Belva Crome, MD;  Location: [place] [facility];  Service: Cardiovascular;  Laterality: N/A;   LEFT HEART CATH AND CORS/GRAFTS ANGIOGRAPHY N/A 01/10/2021   Procedure: LEFT HEART CATH AND CORS/GRAFTS ANGIOGRAPHY;  Surgeon: Leonie Man, MD;  Location: [place] [facility];  Service: Cardiovascular;  Laterality: N/A;   LEFT HEART CATH AND CORS/GRAFTS ANGIOGRAPHY N/A 02/24/2021   Procedure: LEFT HEART CATH AND CORS/GRAFTS ANGIOGRAPHY;  Surgeon: Jettie Booze, MD;  Location: [place] [facility];  Service: Cardiovascular;  Laterality: N/A;   LEFT HEART CATHETERIZATION WITH CORONARY ANGIOGRAM N/A 12/19/2012   Procedure: LEFT HEART CATHETERIZATION WITH CORONARY ANGIOGRAM;  Surgeon: Josue Hector, MD;  Location: [facility] CATH LAB;  Service: Cardiovascular;  Laterality: N/A;   LEFT HEART CATHETERIZATION WITH CORONARY/GRAFT ANGIOGRAM N/A 04/19/2013   Procedure: LEFT HEART CATHETERIZATION WITH Beatrix Fetters;  Surgeon: Lorretta Harp, MD;  Location: [facility] CATH LAB;  Service: Cardiovascular;  Laterality: N/A;   LIGATION OF ARTERIOVENOUS  FISTULA Left 09/15/2020   Procedure: LIGATION OF LEFT ARM ARTERIOVENOUS  FISTULA;  Surgeon: Waynetta Sandy, MD;  Location: [place];  Service: Vascular;  Laterality: Left;   PATCH ANGIOPLASTY Right 04/18/2013   Procedure: PATCH ANGIOPLASTY Right Internal Carotid Artery;  Surgeon: Mal Misty, MD;  Location: [place];  Service: Vascular;  Laterality: Right;   PERCUTANEOUS CORONARY STENT INTERVENTION (PCI-S) Right 12/19/2012   Procedure: PERCUTANEOUS CORONARY STENT INTERVENTION (PCI-S);  Surgeon: Josue Hector, MD;  Location: [facility] CATH LAB;  Service: Cardiovascular;  Laterality: Right;   PERIPHERAL VASCULAR INTERVENTION Left 08/02/2020   Procedure: PERIPHERAL VASCULAR INTERVENTION;  Surgeon: Waynetta Sandy, MD;  Location: [place] [facility];  Service: Cardiovascular;  Laterality: Left;  Left subclavian   PERIPHERAL VASCULAR INTERVENTION  02/24/2021   Procedure: PERIPHERAL VASCULAR INTERVENTION;  Surgeon: Marty Heck, MD;  Location: [place] [facility];  Service: Vascular;;   SHOULDER SURGERY       OB History   No obstetric history on file.     Family History  Problem Relation Age of Onset   Diabetes Mother    Heart disease Mother        before age 74   Hyperlipidemia Mother    Hypertension Mother    Thyroid disease Father    Hypertension Father    AAA (abdominal aortic aneurysm) Father    Heart disease Brother         before age 95   Hypertension Brother    Hyperlipidemia Son    Hypertension Son     Social History   Tobacco Use   Smoking status: Former    Packs/day: 1.00    Years: 20.00    Pack years: 20.00    Types: Cigarettes    Quit date: 12/10/2012    Years since quitting: 8.5   Smokeless tobacco: Never  Vaping Use   Vaping Use: Never used  Substance Use Topics   Alcohol use: No    Alcohol/week: 0.0 standard drinks   Drug use: No    Home Medications Prior to Admission medications   Medication Sig Start Date End Date Taking? Authorizing Provider  Albuterol Sulfate 108 (90 Base) MCG/ACT AEPB Inhale 2 puffs into the lungs every 6 (six) hours as needed (Shortness of breath).    [provider]  allopurinol (ZYLOPRIM) 100 MG tablet Take one tablet after hemodialysis, on Tuesday, Thursday and Saturday. Patient taking differently: Take 100 mg by mouth Every Tuesday,Thursday,and Saturday with dialysis. After dialysis 06/30/20   Arrien, Jimmy Picket, MD  ALPRAZolam Duanne Moron) 0.5 MG tablet Take 0.25-0.5 mg by mouth See admin instructions. Take 0.25 in the morning and evening and 0.5 mg at bedtime    [provider]  aspirin EC 81 MG tablet Take 81 mg by mouth daily.     [provider]  atorvastatin (LIPITOR) 80 MG tablet Take 1 tablet (80 mg total) by mouth every evening. Patient taking differently: Take 80 mg by mouth at bedtime. 12/19/19   Johnson, Clanford L, MD  clopidogrel (PLAVIX) 75 MG tablet Take 1 tablet (75 mg total) by mouth daily. 08/27/18   Satira Sark, MD  diclofenac Sodium (VOLTAREN) 1 % GEL Apply 2-4 g topically daily as needed (FOR NECK PAIN).    [provider]  ezetimibe (ZETIA) 10 MG tablet Take 1 tablet (10 mg total) by mouth daily. Patient taking differently: Take 10 mg by mouth at bedtime. 02/25/21   Bhagat, Crista Luria, PA  ferric citrate (AURYXIA) 1 GM 210 MG(Fe) tablet Take 420 mg by mouth 3 (three) times daily with meals.     [provider]  gabapentin (NEURONTIN) 400 MG capsule Take 1 capsule (400 mg total) by mouth 2 (two) times daily. 06/17/20   Barton Dubois, MD  ipratropium (ATROVENT HFA) 17 MCG/ACT inhaler Inhale 2 puffs into the lungs every 4 (four) hours as needed for wheezing.     [provider]  levothyroxine (SYNTHROID) 112 MCG tablet Take 112 mcg by mouth daily before breakfast.    [provider]  Methoxy PEG-Epoetin Beta (MIRCERA IJ) Mircera 05/28/21 05/27/22  [provider]  midodrine (PROAMATINE) 5 MG tablet Take 1 tablet (5 mg total) by mouth 3 (three) times daily with meals. 06/01/21   Isaiah Serge, NP  multivitamin (RENA-VIT) TABS tablet Take 1 tablet by mouth daily.    [provider]  nitroGLYCERIN (NITROSTAT) 0.4 MG SL tablet Place 1 tablet (0.4 mg total) under the tongue every 5 (five) minutes x 3 doses as needed for chest pain. 02/25/21   Bhagat, Bhavinkumar, PA  NOVOLIN 70/30 FLEXPEN (70-30) 100 UNIT/ML KwikPen Inject 24 Units into the skin in the morning and at bedtime. 06/30/20   [provider]  ondansetron (ZOFRAN) 8 MG tablet Take 8 mg by mouth every 8 (eight) hours as needed for nausea. 07/12/20   [provider]  [facility] ULTRA test strip Check blood glucose THREE TIMES DAILY 03/31/21   [provider]  oxyCODONE-acetaminophen (PERCOCET) 5-325 MG tablet Take 1 tablet by mouth every 6 (six) hours as needed for severe pain. Patient not taking: No sig reported 05/11/21 05/11/22  Baglia, Corrina, PA-C  pantoprazole (PROTONIX) 40 MG tablet Take 1 tablet (40 mg total) by mouth 2 (two) times daily. 06/17/20   Barton Dubois, MD  ranolazine (RANEXA) 500 MG 12 hr tablet Take 1 tablet (500 mg total) by mouth 2 (two) times daily. 07/09/20   Verta Ellen., NP  Vitamin D, Ergocalciferol, (DRISDOL) 1.25 MG (50000 UNIT) CAPS capsule Take 50,000 Units by mouth every Sunday.  12/31/19   [provider]    Allergies  Penicillins and Beta adrenergic blockers  Review of Systems   Review of Systems  All other systems reviewed and are negative.  Physical Exam Updated Vital Signs BP 136/60   Pulse 91   Temp 98.1 F (36.7 C)   Resp 20   Ht 5\' 6"  (1.676 m)   Wt 99.8 kg   SpO2 97%   BMI 35.51 kg/m   Physical Exam Vitals and nursing note reviewed.  Constitutional:      General: She is not in acute distress.    Appearance: She is well-developed. She is not diaphoretic.  HENT:     Head: Normocephalic and atraumatic.  Cardiovascular:     Rate and Rhythm: Normal rate and regular rhythm.     Heart sounds: No murmur heard.   No friction rub. No gallop.  Pulmonary:     Effort: Pulmonary effort is normal. No respiratory distress.     Breath sounds: Normal breath sounds. No wheezing.  Abdominal:     General: Bowel sounds are normal. There is no distension.     Palpations: Abdomen is soft.     Tenderness: There is no abdominal tenderness.  Musculoskeletal:        General: Normal range of motion.     Cervical back: Normal range of motion and neck supple.     Right lower leg: No tenderness. No edema.     Left lower leg: No tenderness. No edema.  Skin:    General: Skin is warm and dry.  Neurological:     General: No focal deficit present.     Mental Status: She is alert and oriented to person, place, and time.    ED Results / Procedures / Treatments   Labs (all labs ordered are listed, but only abnormal results are displayed) Labs Reviewed  BASIC METABOLIC PANEL - Abnormal; Notable for the following components:      Result Value   Chloride 97 (*)    Glucose, Bld 341 (*)    BUN 60 (*)    Creatinine, Ser 7.36 (*)    GFR, Estimated 6 (*)    Anion gap 16 (*)    All other components within normal limits  CBC - Abnormal; Notable for the following components:   WBC 12.6 (*)    RBC 3.76 (*)    MCV 104.8 (*)    MCH 34.3 (*)    RDW 16.0 (*)    All other components within normal limits   TROPONIN I (HIGH SENSITIVITY) - Abnormal; Notable for the following components:   Troponin I (High Sensitivity) 55 (*)    All other components within normal limits  TROPONIN I (HIGH SENSITIVITY)    EKG EKG Interpretation  Date/Time:  Monday June 13 2021 21:30:38 EDT Ventricular Rate:  103 PR Interval:  188 QRS Duration: 110 QT Interval:  386 QTC Calculation: 505 R Axis:   78 Text Interpretation: Sinus tachycardia Minimal voltage criteria for LVH, may be normal variant ( Cornell product ) Marked ST abnormality, possible inferior subendocardial injury Abnormal ECG Confirmed by Milton Ferguson [phone]) on 06/13/2021 9:58:46 PM  Radiology DG Chest 2 View  Result Date: 06/13/2021 CLINICAL DATA:  Chest pain EXAM: CHEST - 2 VIEW COMPARISON:  05/28/2021 FINDINGS: The lungs are symmetrically well expanded. Trace interstitial infiltrate has developed diffusely most in keeping with mild interstitial pulmonary edema. No pneumothorax or pleural effusion. Right internal jugular hemodialysis catheter tip noted within the right atrium. Cardiac size within normal limits. Median sternotomy has been  performed. No acute bone abnormality. IMPRESSION: Interval development of mild interstitial pulmonary edema. Electronically Signed   By: Fidela Salisbury M.D.   On: 06/13/2021 22:17    Procedures Procedures   Medications Ordered in ED Medications - No data to display  ED Course  I have reviewed the triage vital signs and the nursing notes.  Pertinent labs & imaging results that were available during my care of the patient were reviewed by me and considered in my medical decision making (see chart for details).    MDM Rules/Calculators/A&P  Patient with extensive past medical history as described in the HPI including prior MI with CABG and multiple stents.  She presents with chest discomfort that feels similar to what she is experienced with her cardiac symptoms in the past.  Initial EKG unchanged  and initial troponin mildly elevated at 55.  Delta troponin has returned greater than 500 concerning for non-STEMI.  This was discussed with Dr. Clayton Bibles from cardiology who feels as though the patient should be admitted at [facility] for further evaluation.  Due to the fact that she is a dialysis patient, she will be admitted to the hospitalist service.  I have spoken with Dr. Josephine Cables who will make arrangements for the admission.  In the meantime, she has been given nitroglycerin paste, aspirin, and started on heparin.  She was also given morphine for her pain.  CRITICAL CARE Performed by: Veryl Speak Total critical care time: 40 minutes Critical care time was exclusive of separately billable procedures and treating other patients. Critical care was necessary to treat or prevent imminent or life-threatening deterioration. Critical care was time spent personally by me on the following activities: development of treatment plan with patient and/or surrogate as well as nursing, discussions with consultants, evaluation of patient's response to treatment, examination of patient, obtaining history from patient or surrogate, ordering and performing treatments and interventions, ordering and review of laboratory studies, ordering and review of radiographic studies, pulse oximetry and re-evaluation of patient's condition.  ADDENDUM:  Patient's third troponin has returned and is over 12,000.  Patient still awaiting bed assignment at [facility].  I have spoken with Dr. Dina Rich in the emergency department and we will transfer the patient from here to there awaiting bed assignment so that cardiology can see her in a timely fashion.  Final Clinical Impression(s) / ED Diagnoses Final diagnoses:  None    Rx / DC Orders ED Discharge Orders     None        Veryl Speak, MD 06/14/21 [id number]    Veryl Speak, MD 06/14/21 [phone]

## 2021-06-14 NOTE — H&P (Signed)
History and Physical  Donna Howe GGY:[id number] DOB: [birth year] DOA: 06/13/2021  Referring physician: Veryl Speak, MD PCP: Curlene Labrum, MD  Patient coming from: Home  Chief Complaint: Chest pain  HPI: Donna Howe is a 65 y.o. female with medical history significant for  coronary artery disease status post CABG (2005) and numerous PCI, ESRD on hemodialysis(T/TH/S), T2 diabetes mellitus, obesity and hypertension who presents to the emergency department who presents to the emergency department due to chest pain which has been ongoing intermittently for the past 2 days.  She states that intermittently she usually have chest pain which usually resolve with nitroglycerin, but the intermittent chest pain worsened today, it was located in the left chest area with radiation to left shoulder, chest pain was nonreproducible and was described as pressure-like and rated as 10/10 on pain scale, this was associated with excessive sweating which was not alleviated with 4 sublingual nitroglycerin taken.  EMS was activated, on arrival of EMS, 4 baby aspirin was given with only slight improvement in chest pain.  She complained of transitory nausea while in the ambulance, but this has since resolved.  Patient denies fever or chills.  ED Course:  In the emergency department, she was hemodynamically stable.  Work-up in the ED showed leukocytosis, elevated MCV (104.8) hyperglycemia and BUN/creatinine of 60/7.36 and GFR of 6.  Troponin x2-55 > 523. Chest x-ray showed interval development of mild interstitial pulmonary edema. Nitroglycerin paste was given, patient was started on IV heparin drip and IV morphine 4 mg x 1 was given with improvement in chest pain to about 4/10 on pain scale. Cardiology fellow at [facility] was consulted and recommended admitting patient to [facility] with plan for cardiology to consult with the patient in the morning per ED physician.  Hospitalist was asked to admit patient for  further evaluation and management..  Review of Systems: Constitutional: Negative for chills and fever.  HENT: Negative for ear pain and sore throat.   Eyes: Negative for pain and visual disturbance.  Respiratory: Negative for cough, chest tightness and shortness of breath.   Cardiovascular: Positive for chest pain and negative for palpitations.  Gastrointestinal: Negative for abdominal pain and vomiting.  Endocrine: Negative for polyphagia and polyuria.  Genitourinary: Negative for decreased urine volume, dysuria, enuresis Musculoskeletal: Negative for arthralgias and back pain.  Skin: Negative for color change and rash.  Allergic/Immunologic: Negative for immunocompromised state.  Neurological: Negative for tremors, syncope, speech difficulty Hematological: Does not bruise/bleed easily.  All other systems reviewed and are negative   Past Medical History:  Diagnosis Date   Anemia    Anxiety    Asthma    CAD (coronary artery disease)    Multivessel s/p CABG 2005, numerous PCIs since that time and documented graft disease   Carotid artery disease ([place])    R CEA   ESRD on hemodialysis ([place])    Essential hypertension    Gout    History of blood transfusion    Hyperlipidemia    Hypothyroidism    Myocardial infarction [facility])    PAD (peripheral artery disease) ([place])    Dr. Kellie Simmering   Pneumonia 09/2019, 11/2019   S/P angioplasty with stent- DES to mRCA and to LIMA to LAD with DES 04/09/18.   04/10/2018   SBO (small bowel obstruction) ([place]) 2011   Status post lysis of adhesions & hernia repair   Sinus bradycardia    Type 2 diabetes mellitus ([place])    Umbilical hernia  Past Surgical History:  Procedure Laterality Date   AORTIC ARCH ANGIOGRAPHY N/A 08/02/2020   Procedure: AORTIC ARCH ANGIOGRAPHY;  Surgeon: Waynetta Sandy, MD;  Location: [place] CV LAB;  Service: Cardiovascular;  Laterality: N/A;  Lt upper extermity   AV FISTULA PLACEMENT Left 06/29/2020   Procedure:  LEFT ARM ARTERIOVENOUS (AV) FISTULA;  Surgeon: Waynetta Sandy, MD;  Location: [place];  Service: Vascular;  Laterality: Left;  ARM   AV FISTULA PLACEMENT Right 05/11/2021   Procedure: RIGHT BRACHIOCEPHALIC ARTERIOVENOUS (AV) FISTULA CREATION;  Surgeon: Waynetta Sandy, MD;  Location[facility];  Service: Vascular;  Laterality: Right;   [place]  2010   CORONARY ARTERY BYPASS GRAFT  2005   CORONARY BALLOON ANGIOPLASTY N/A 05/31/2017   Procedure: CORONARY BALLOON ANGIOPLASTY;  Surgeon: Jettie Booze, MD;  Location: [place] CV LAB;  Service: Cardiovascular;  Laterality: N/A;   CORONARY STENT INTERVENTION N/A 05/31/2017   Procedure: CORONARY STENT INTERVENTION;  Surgeon: Jettie Booze, MD;  Location: [place] CV LAB;  Service: Cardiovascular;  Laterality: N/A;   CORONARY STENT INTERVENTION N/A 04/09/2018   Procedure: CORONARY STENT INTERVENTION;  Surgeon: Jettie Booze, MD;  Location: [place] CV LAB;  Service: Cardiovascular;  Laterality: N/A;  SVG RCA   CORONARY STENT INTERVENTION N/A 01/10/2021   Procedure: CORONARY STENT INTERVENTION;  Surgeon: Leonie Man, MD;  Location: [place] CV LAB;  Service: Cardiovascular;  Laterality: N/A;   CORONARY/GRAFT ACUTE MI REVASCULARIZATION N/A 02/24/2021   Procedure: Coronary/Graft Acute MI Revascularization;  Surgeon: Jettie Booze, MD;  Location: [place] CV LAB;  Service: Cardiovascular;  Laterality: N/A;   ENDARTERECTOMY Right 04/18/2013   Procedure: ENDARTERECTOMY CAROTID;  Surgeon: Mal Misty, MD;  Location: [place];  Service: Vascular;  Laterality: Right;   HERNIA REPAIR  1989   Incisional hernia repair x2  03/04/2010   Laparoscopic with 35cm mesh by Dr Ronnald Collum   IR [place] CV LINE RIGHT  06/21/2020   IR US GUIDE VASC ACCESS RIGHT  06/21/2020   LEFT HEART CATH AND CORS/GRAFTS ANGIOGRAPHY N/A 05/31/2017   Procedure: LEFT HEART CATH AND CORS/GRAFTS  ANGIOGRAPHY;  Surgeon: Jettie Booze, MD;  Location: [place] CV LAB;  Service: Cardiovascular;  Laterality: N/A;   LEFT HEART CATH AND CORS/GRAFTS ANGIOGRAPHY N/A 04/08/2018   Procedure: LEFT HEART CATH AND CORS/GRAFTS ANGIOGRAPHY;  Surgeon: Jettie Booze, MD;  Location: [place] CV LAB;  Service: Cardiovascular;  Laterality: N/A;   LEFT HEART CATH AND CORS/GRAFTS ANGIOGRAPHY N/A 06/22/2020   Procedure: LEFT HEART CATH AND CORS/GRAFTS ANGIOGRAPHY;  Surgeon: Belva Crome, MD;  Location: [place] CV LAB;  Service: Cardiovascular;  Laterality: N/A;   LEFT HEART CATH AND CORS/GRAFTS ANGIOGRAPHY N/A 01/10/2021   Procedure: LEFT HEART CATH AND CORS/GRAFTS ANGIOGRAPHY;  Surgeon: Leonie Man, MD;  Location: [place] CV LAB;  Service: Cardiovascular;  Laterality: N/A;   LEFT HEART CATH AND CORS/GRAFTS ANGIOGRAPHY N/A 02/24/2021   Procedure: LEFT HEART CATH AND CORS/GRAFTS ANGIOGRAPHY;  Surgeon: Jettie Booze, MD;  Location: [place] CV LAB;  Service: Cardiovascular;  Laterality: N/A;   LEFT HEART CATHETERIZATION WITH CORONARY ANGIOGRAM N/A 12/19/2012   Procedure: LEFT HEART CATHETERIZATION WITH CORONARY ANGIOGRAM;  Surgeon: Josue Hector, MD;  Location: [facility] CATH LAB;  Service: Cardiovascular;  Laterality: N/A;   LEFT HEART CATHETERIZATION WITH CORONARY/GRAFT ANGIOGRAM N/A 04/19/2013   Procedure: LEFT HEART CATHETERIZATION WITH CORONARY/GRAFT ANGIOGRAM;  Surgeon:  Lorretta Harp, MD;  Location: [facility] CATH LAB;  Service: Cardiovascular;  Laterality: N/A;   LIGATION OF ARTERIOVENOUS  FISTULA Left 09/15/2020   Procedure: LIGATION OF LEFT ARM ARTERIOVENOUS  FISTULA;  Surgeon: Waynetta Sandy, MD;  Location: [place];  Service: Vascular;  Laterality: Left;   PATCH ANGIOPLASTY Right 04/18/2013   Procedure: PATCH ANGIOPLASTY Right Internal Carotid Artery;  Surgeon: Mal Misty, MD;  Location: [place];  Service: Vascular;  Laterality: Right;   PERCUTANEOUS CORONARY STENT  INTERVENTION (PCI-S) Right 12/19/2012   Procedure: PERCUTANEOUS CORONARY STENT INTERVENTION (PCI-S);  Surgeon: Josue Hector, MD;  Location[facility] C[facility] LAB;  Service: Cardiovascular;  Laterality: Right;   PERIPHERAL VASCULAR INTERVENTION Left 08/02/2020   Procedure: PERIPHERAL VASCULAR INTERVENTION;  Surgeon: Waynetta Sandy, MD;  Location: [place] CV LAB;  Service: Cardiovascular;  Laterality: Left;  Left subclavian   PERIPHERAL VASCULAR INTERVENTION  02/24/2021   Procedure: PERIPHERAL VASCULAR INTERVENTION;  Surgeon: Marty Heck, MD;  Location: [place] CV LAB;  Service: Vascular;;   SHOULDER SURGERY      Social History:  reports that she quit smoking about 8 years ago. Her smoking use included cigarettes. She has a 20.00 pack-year smoking history. She has never used smokeless tobacco. She reports that she does not drink alcohol and does not use drugs.   Allergies  Allergen Reactions   Penicillins Other (See Comments)    REACTION: Unknown, told as a child Has patient had a PCN reaction causing immediate rash, facial/tongue/throat swelling, SOB or lightheadedness with hypotension: Unknown Has patient had a PCN reaction causing severe rash involving mucus membranes or skin necrosis: Unknown Has patient had a PCN reaction that required hospitalization: Unknown Has patient had a PCN reaction occurring within the last 10 years: No If all of the above answers are "NO", then may proceed with Cephalosporin use.    Beta Adrenergic Blockers Other (See Comments)    Hypotension    Family History  Problem Relation Age of Onset   Diabetes Mother    Heart disease Mother        before age 71   Hyperlipidemia Mother    Hypertension Mother    Thyroid disease Father    Hypertension Father    AAA (abdominal aortic aneurysm) Father    Heart disease Brother        before age 72   Hypertension Brother    Hyperlipidemia Son    Hypertension Son      Prior to Admission  medications   Medication Sig Start Date End Date Taking? Authorizing Provider  Albuterol Sulfate 108 (90 Base) MCG/ACT AEPB Inhale 2 puffs into the lungs every 6 (six) hours as needed (Shortness of breath).    [provider]  allopurinol (ZYLOPRIM) 100 MG tablet Take one tablet after hemodialysis, on Tuesday, Thursday and Saturday. Patient taking differently: Take 100 mg by mouth Every Tuesday,Thursday,and Saturday with dialysis. After dialysis 06/30/20   Arrien, Jimmy Picket, MD  ALPRAZolam Duanne [place]on) 0.5 MG tablet Take 0.25-0.5 mg by mouth See admin instructions. Take 0.25 in the morning and evening and 0.5 mg at bedtime    [provider]  aspirin EC 81 MG tablet Take 81 mg by mouth daily.     [provider]  atorvastatin (LIPITOR) 80 MG tablet Take 1 tablet (80 mg total) by mouth every evening. Patient taking differently: Take 80 mg by mouth at bedtime. 12/19/19   Johnson, Clanford L, MD  clopidogrel (PLAVIX) 75 MG  tablet Take 1 tablet (75 mg total) by mouth daily. 08/27/18   Satira Sark, MD  diclofenac Sodium (VOLTAREN) 1 % GEL Apply 2-4 g topically daily as needed (FOR NECK PAIN).    [provider]  ezetimibe (ZETIA) 10 MG tablet Take 1 tablet (10 mg total) by mouth daily. Patient taking differently: Take 10 mg by mouth at bedtime. 02/25/21   Bhagat, Crista Luria, PA  ferric citrate (AURYXIA) 1 GM 210 MG(Fe) tablet Take 420 mg by mouth 3 (three) times daily with meals.    [provider]  gabapentin (NEURONTIN) 400 MG capsule Take 1 capsule (400 mg total) by mouth 2 (two) times daily. 06/17/20   Barton Dubois, MD  ipratropium (ATROVENT HFA) 17 MCG/ACT inhaler Inhale 2 puffs into the lungs every 4 (four) hours as needed for wheezing.     [provider]  levothyroxine (SYNTHROID) 112 MCG tablet Take 112 mcg by mouth daily before breakfast.    [provider]  Methoxy PEG-Epoetin Beta (MIRCERA IJ) Mircera 05/28/21 05/27/22   [provider]  midodrine (PROAMATINE) 5 MG tablet Take 1 tablet (5 mg total) by mouth 3 (three) times daily with meals. 06/01/21   Isaiah Serge, NP  multivitamin (RENA-VIT) TABS tablet Take 1 tablet by mouth daily.    [provider]  nitroGLYCERIN (NITROSTAT) 0.4 MG SL tablet Place 1 tablet (0.4 mg total) under the tongue every 5 (five) minutes x 3 doses as needed for chest pain. 02/25/21   Bhagat, Bhavinkumar, PA  NOVOLIN 70/30 FLEXPEN (70-30) 100 UNIT/ML KwikPen Inject 24 Units into the skin in the morning and at bedtime. 06/30/20   [provider]  ondansetron (ZOFRAN) 8 MG tablet Take 8 mg by mouth every 8 (eight) hours as needed for nausea. 07/12/20   [provider]  [facility] ULTRA test strip Check blood glucose THREE TIMES DAILY 03/31/21   [provider]  oxyCODONE-acetaminophen (PERCOCET) 5-325 MG tablet Take 1 tablet by mouth every 6 (six) hours as needed for severe pain. Patient not taking: No sig reported 05/11/21 05/11/22  Baglia, Corrina, PA-C  pantoprazole (PROTONIX) 40 MG tablet Take 1 tablet (40 mg total) by mouth 2 (two) times daily. 06/17/20   Barton Dubois, MD  ranolazine (RANEXA) 500 MG 12 hr tablet Take 1 tablet (500 mg total) by mouth 2 (two) times daily. 07/09/20   Verta Ellen., NP  Vitamin D, Ergocalciferol, (DRISDOL) 1.25 MG (50000 UNIT) CAPS capsule Take 50,000 Units by mouth every Sunday.  12/31/19   [provider]    Physical Exam: BP 128/66   Pulse 74   Temp 98.1 F (36.7 C)   Resp 17   Ht 5\' 6"  (1.676 m)   Wt 99.8 kg   SpO2 99%   BMI 35.51 kg/m   General: 65 y.o. year-old female well developed well nourished in no acute distress.  Alert and oriented x3. HEENT: NCAT, EOMI Neck: Supple, trachea medial Cardiovascular: Regular rate and rhythm with no rubs or gallops.  No thyromegaly or JVD noted.  No lower extremity edema. 2/4 pulses in all 4 extremities. Respiratory: Clear to auscultation with no  wheezes or rales. Good inspiratory effort. Abdomen: Soft, nontender nondistended with normal bowel sounds x4 quadrants. Muskuloskeletal: No cyanosis, clubbing or edema noted bilaterally Neuro: CN II-XII intact, strength 5/5 x 4, sensation, reflexes intact Skin: No ulcerative lesions noted or rashes Psychiatry: Judgement and insight appear normal. Mood is appropriate for condition and setting  Labs on Admission:  Basic Metabolic Panel: Recent Labs  Lab 06/13/21 2143  NA 135  K 3.7  CL 97*  CO2 22  GLUCOSE 341*  BUN 60*  CREATININE 7.36*  CALCIUM 9.5   Liver Function Tests: No results for input(s): AST, ALT, ALKPHOS, BILITOT, PROT, ALBUMIN in the last 168 hours. No results for input(s): LIPASE, AMYLASE in the last 168 hours. No results for input(s): AMMONIA in the last 168 hours. CBC: Recent Labs  Lab 06/13/21 2143  WBC 12.6*  HGB 12.9  HCT 39.4  MCV 104.8*  PLT 180   Cardiac Enzymes: No results for input(s): CKTOTAL, CKMB, CKMBINDEX, TROPONINI in the last 168 hours.  BNP (last 3 results) Recent Labs    06/18/20 1549 05/28/21 1129  BNP 2,129.0* 205.0*    ProBNP (last 3 results) No results for input(s): PROBNP in the last 8760 hours.  CBG: No results for input(s): GLUCAP in the last 168 hours.  Radiological Exams on Admission: DG Chest 2 View  Result Date: 06/13/2021 CLINICAL DATA:  Chest pain EXAM: CHEST - 2 VIEW COMPARISON:  05/28/2021 FINDINGS: The lungs are symmetrically well expanded. Trace interstitial infiltrate has developed diffusely most in keeping with mild interstitial pulmonary edema. No pneumothorax or pleural effusion. Right internal jugular hemodialysis catheter tip noted within the right atrium. Cardiac size within normal limits. Median sternotomy has been performed. No acute bone abnormality. IMPRESSION: Interval development of mild interstitial pulmonary edema. Electronically Signed   By: Fidela Salisbury M.D.   On: 06/13/2021 22:17     EKG: I independently viewed the EKG done and my findings are as followed: Normal sinus rhythm at a rate of 89 bpm  Assessment/Plan Present on Admission:  NSTEMI (non-ST elevated myocardial infarction) ([place])  Leukocytosis  Elevated troponin I level  Elevated MCV  Obesity (BMI 30-39.9)  GERD (gastroesophageal reflux disease)  Principal Problem:   NSTEMI (non-ST elevated myocardial infarction) ([place]) Active Problems:   Mixed hyperlipidemia   Obesity (BMI 30-39.9)   Hypothyroidism (acquired)   Elevated troponin I level   Hyperglycemia due to diabetes mellitus ([facility])   GERD (gastroesophageal reflux disease)   ESRD on dialysis ([facility])   Leukocytosis   Elevated MCV   Pulmonary edema  NSTEMI Elevated troponin Troponin x2-55 > 523 Patient complained of pressure-like left-sided chest pain with radiation to left shoulder Patient has history of CAD s/p CABG and several PCI's Patient already had aspirin 324 mg x 1; continue aspirin 81 mg daily, Plavix IV Heparin already started in the ED Continue to trend troponin Continue nitroglycerin paste Cardiology fellow consulted recommended admitting patient to [facility] so that cardiology will consult with patient in the morning per ED physician.  Pulmonary edema  Chest x-ray showed interval development of mild interstitial pulmonary edema. Patient was not in any acute distress Consider Lasix for worsening of symptoms  Leukocytosis possibly reactive WBC 12.6 No obvious sign of acute infectious process Continue to monitor WBC with morning labs  Hyperglycemia secondary to type 2 diabetes mellitus Continue ISS and hypoglycemia protocol  Elevated MCV (104.8) Folate and vitamin B12 levels will be checked  ESRD on HD (TThS) Last dialysis was on Saturday (9/24) Nephrology will be consulted for maintenance dialysis while being hospitalized  CAD s/p CABG and PCI's Continue aspirin, Plavix, Lipitor  Mixed hyperlipidemia Continue Lipitor and  Zetia  Hypothyroidism Continue Synthroid  GERD Continue Protonix  Obesity (BMI 35.51) Patient was counseled on diet and lifestyle modification  Other home meds Albuterol  DVT prophylaxis: Heparin drip  Code Status: Full code  Family Communication: None at bedside  Disposition Plan:  Patient is from:                        home Anticipated DC to:                   SNF or family members home Anticipated DC date:               2-3 days Anticipated DC barriers:          Patient requires inpatient management due to NSTEMI and pending cardiology consult   Consults called: Cardiology, nephrology  Admission status: Inpatient    Bernadette Hoit MD [facility]  06/14/2021, 3:45 AM

## 2021-06-14 NOTE — Consult Note (Signed)
Cardiology Consult:   Patient ID: Donna Howe MRN: [id number]; DOB: 1956/05/18   Admission date: 06/13/2021  PCP:  Curlene Labrum, MD   [facility] Providers Cardiologist:  Rozann Lesches, MD        Chief Complaint:  NSTEMI  Patient Profile:   Donna Howe is a 65 y.o. female with  history of CAD (s/p CABG in 2005 with multiple PCI's since including PTCA to SVG-OM1 in 01/2004, PTCA/DES to LM and prox LCx in 03/2004 and DES to SVG-OM1.   S/p PTCA/DES to SVG-OM in 01/2011, PTCA/DES to LM and LCx in 2015, cutting balloon angioplasty to prox Cx and DES to SVG-RCA in 05/2017, NSTEMI in 03/2018 with DES to mid-RCA and DES to LIMA-LAD.   NSTEMI in 06/2020 and medical management recommended as she was felt to be high-risk for re-do CABG and poor target vessels, s/p NSTEMI in 12/2020 with DESx2 to SVG-RCA).  HTN, HLD, Type 2 DM, carotid artery stenosis (s/p R CEA in 2014), left subclavian stenosis (s/p stent in 07/2020) and ESRD  NSTEMI 02/2021 s/p scoring balloon angioplasty and DES to the SVG-PDA, also with severe ISR subclavian stent >> PTA & stent.  DAPT indefinitely. 04/2021 s/p Rt brachial artery cephalic vein fistula creation for dialysis.  She is being seen 06/14/2021 for the evaluation of NSTEMI at the request of Dr. Cathlean Sauer.  Intolerant to beta-blockers and Imdur.  Problems w/ pulling off fluid at HD due to low BP.  History of Present Illness:   Donna Howe was initially not having CP after she went home after her last PCI.  She started having CP episodes again about the first of September. At first, the episodes were occasional and relieved by SL NTG. The episodes gradually increased in frequency and intensity. 5/10.  ER visit 05/28/2021 for chest pain, troponin 18-19, no other significant abnormalities.  DC home.  On 09/14, she was seen in the office. Low BP has been a problem, as well as ongoing angina. She had midodrine added and was continued on Ranexa once a  day. Bid Ranexa caused dizziness. She takes it at night to minimize the side effects and not interfere w/ HD.  After that office visit, the symptoms increased in frequency but not intensity....Marland Kitchenuntil yesterday.  Yesterday, she had chest pain all day. She had woken with it the night before and could not get comfortable. Unable to lie on L side.  The pain reached a 12/10 at times, someone sitting on her chest. She took nitro off/on all day with temporary improvement but not relief.   Last pm, it was just too much, she called EMS. She got ASA 324 MG, morphine, nitro ointment, heparin.   The pain finally went away.  This am, she has some chest discomfort, but not like yesterday. 3/10 right now.    Her BP has been low but she is currently asymptomatic. SBP 70s >> nitro paste removed. SBP currently 86.  Historically, she has problems w/ low BP, can take it in L arm and both legs. HD fistula on R, subclavian stent on L along with previous left antecubital fistula.   Past Medical History:  Diagnosis Date   Anemia    Anxiety    Asthma    CAD (coronary artery disease)    Multivessel s/p CABG 2005, numerous PCIs since that time and documented graft disease   Carotid artery disease ([place])    R CEA   ESRD on hemodialysis ([place])    Essential  hypertension    Gout    History of blood transfusion    Hyperlipidemia    Hypothyroidism    Myocardial infarction [facility])    PAD (peripheral artery disease) ([place])    Dr. Kellie Simmering   Pneumonia 09/2019, 11/2019   S/P angioplasty with stent- DES to mRCA and to LIMA to LAD with DES 04/09/18.   04/10/2018   SBO (small bowel obstruction) ([place]) 2011   Status post lysis of adhesions & hernia repair   Sinus bradycardia    Type 2 diabetes mellitus ([place])    Umbilical hernia     Past Surgical History:  Procedure Laterality Date   AORTIC ARCH ANGIOGRAPHY N/A 08/02/2020   Procedure: AORTIC ARCH ANGIOGRAPHY;  Surgeon: Waynetta Sandy, MD;  Location: [place] CV LAB;  Service: Cardiovascular;  Laterality: N/A;  Lt upper extermity   AV FISTULA PLACEMENT Left 06/29/2020   Procedure: LEFT ARM ARTERIOVENOUS (AV) FISTULA;  Surgeon: Waynetta Sandy, MD;  Location: [place];  Service: Vascular;  Laterality: Left;  ARM   AV FISTULA PLACEMENT Right 05/11/2021   Procedure: RIGHT BRACHIOCEPHALIC ARTERIOVENOUS (AV) FISTULA CREATION;  Surgeon: Waynetta Sandy, MD;  Location: [facility];  Service: Vascular;  Laterality: Right;   [place]  2010   CORONARY ARTERY BYPASS GRAFT  2005   CORONARY BALLOON ANGIOPLASTY N/A 05/31/2017   Procedure: CORONARY BALLOON ANGIOPLASTY;  Surgeon: Jettie Booze, MD;  Location: [place] [facility] LAB;  Service: Cardiovascular;  Laterality: N/A;   CORONARY STENT INTERVENTION N/A 05/31/2017   Procedure: CORONARY STENT INTERVENTION;  Surgeon: Jettie Booze, MD;  Location: [place] [facility] LAB;  Service: Cardiovascular;  Laterality: N/A;   CORONARY STENT INTERVENTION N/A 04/09/2018   Procedure: CORONARY STENT INTERVENTION;  Surgeon: Jettie Booze, MD;  Location: [place] [facility];  Service: Cardiovascular;  Laterality: N/A;  SVG RCA   CORONARY STENT INTERVENTION N/A 01/10/2021   Procedure: CORONARY STENT INTERVENTION;  Surgeon: Leonie Man, MD;  Location: [place] [facility];  Service: Cardiovascular;  Laterality: N/A;   CORONARY/GRAFT ACUTE MI REVASCULARIZATION N/A 02/24/2021   Procedure: Coronary/Graft Acute MI Revascularization;  Surgeon: Jettie Booze, MD;  Location: [place] [facility];  Service: Cardiovascular;  Laterality: N/A;   ENDARTERECTOMY Right 04/18/2013   Procedure: ENDARTERECTOMY CAROTID;  Surgeon: Mal Misty, MD;  Location: [place];  Service: Vascular;  Laterality: Right;   HERNIA REPAIR  1989   Incisional hernia repair x2  03/04/2010   Laparoscopic with 35cm mesh by Dr Ronnald Collum   IR [place] CV LINE RIGHT  06/21/2020   IR US GUIDE VASC ACCESS  RIGHT  06/21/2020   LEFT HEART CATH AND CORS/GRAFTS ANGIOGRAPHY N/A 05/31/2017   Procedure: LEFT HEART CATH AND CORS/GRAFTS ANGIOGRAPHY;  Surgeon: Jettie Booze, MD;  Location: [place] [facility];  Service: Cardiovascular;  Laterality: N/A;   LEFT HEART CATH AND CORS/GRAFTS ANGIOGRAPHY N/A 04/08/2018   Procedure: LEFT HEART CATH AND CORS/GRAFTS ANGIOGRAPHY;  Surgeon: Jettie Booze, MD;  Location: [place] [facility];  Service: Cardiovascular;  Laterality: N/A;   LEFT HEART CATH AND CORS/GRAFTS ANGIOGRAPHY N/A 06/22/2020   Procedure: LEFT HEART CATH AND CORS/GRAFTS ANGIOGRAPHY;  Surgeon: Belva Crome, MD;  Location: [place] [facility];  Service: Cardiovascular;  Laterality: N/A;   LEFT HEART CATH AND CORS/GRAFTS ANGIOGRAPHY N/A 01/10/2021   Procedure: LEFT HEART CATH AND CORS/GRAFTS ANGIOGRAPHY;  Surgeon: Leonie Man, MD;  Location: [facility]  INVASIVE CV LAB;  Service: Cardiovascular;  Laterality: N/A;   LEFT HEART CATH AND CORS/GRAFTS ANGIOGRAPHY N/A 02/24/2021   Procedure: LEFT HEART CATH AND CORS/GRAFTS ANGIOGRAPHY;  Surgeon: Jettie Booze, MD;  Location: [place] [facility];  Service: Cardiovascular;  Laterality: N/A;   LEFT HEART CATHETERIZATION WITH CORONARY ANGIOGRAM N/A 12/19/2012   Procedure: LEFT HEART CATHETERIZATION WITH CORONARY ANGIOGRAM;  Surgeon: Josue Hector, MD;  Location: [facility] CATH LAB;  Service: Cardiovascular;  Laterality: N/A;   LEFT HEART CATHETERIZATION WITH CORONARY/GRAFT ANGIOGRAM N/A 04/19/2013   Procedure: LEFT HEART CATHETERIZATION WITH Beatrix Fetters;  Surgeon: Lorretta Harp, MD;  Location: [facility] CATH LAB;  Service: Cardiovascular;  Laterality: N/A;   LIGATION OF ARTERIOVENOUS  FISTULA Left 09/15/2020   Procedure: LIGATION OF LEFT ARM ARTERIOVENOUS  FISTULA;  Surgeon: Waynetta Sandy, MD;  Location: [place];  Service: Vascular;  Laterality: Left;   PATCH ANGIOPLASTY Right 04/18/2013   Procedure: PATCH ANGIOPLASTY Right Internal Carotid Artery;   Surgeon: Mal Misty, MD;  Location: [place];  Service: Vascular;  Laterality: Right;   PERCUTANEOUS CORONARY STENT INTERVENTION (PCI-S) Right 12/19/2012   Procedure: PERCUTANEOUS CORONARY STENT INTERVENTION (PCI-S);  Surgeon: Josue Hector, MD;  Location: [facility] CATH LAB;  Service: Cardiovascular;  Laterality: Right;   PERIPHERAL VASCULAR INTERVENTION Left 08/02/2020   Procedure: PERIPHERAL VASCULAR INTERVENTION;  Surgeon: Waynetta Sandy, MD;  Location: [place] CV LAB;  Service: Cardiovascular;  Laterality: Left;  Left subclavian   PERIPHERAL VASCULAR INTERVENTION  02/24/2021   Procedure: PERIPHERAL VASCULAR INTERVENTION;  Surgeon: Marty Heck, MD;  Location: [place] [facility];  Service: Vascular;;   SHOULDER SURGERY       Medications Prior to Admission: Prior to Admission medications   Medication Sig Start Date End Date Taking? Authorizing Provider  Albuterol Sulfate 108 (90 Base) MCG/ACT AEPB Inhale 2 puffs into the lungs every 6 (six) hours as needed (Shortness of breath).    [provider]  allopurinol (ZYLOPRIM) 100 MG tablet Take one tablet after hemodialysis, on Tuesday, Thursday and Saturday. Patient taking differently: Take 100 mg by mouth Every Tuesday,Thursday,and Saturday with dialysis. After dialysis 06/30/20   Arrien, Jimmy Picket, MD  ALPRAZolam Duanne Moron) 0.5 MG tablet Take 0.25-0.5 mg by mouth See admin instructions. Take 0.25 in the morning and evening and 0.5 mg at bedtime    [provider]  aspirin EC 81 MG tablet Take 81 mg by mouth daily.     [provider]  atorvastatin (LIPITOR) 80 MG tablet Take 1 tablet (80 mg total) by mouth every evening. Patient taking differently: Take 80 mg by mouth at bedtime. 12/19/19   Johnson, Clanford L, MD  clopidogrel (PLAVIX) 75 MG tablet Take 1 tablet (75 mg total) by mouth daily. 08/27/18   Satira Sark, MD  diclofenac Sodium (VOLTAREN) 1 % GEL Apply 2-4 g topically daily as needed  (FOR NECK PAIN).    [provider]  ezetimibe (ZETIA) 10 MG tablet Take 1 tablet (10 mg total) by mouth daily. Patient taking differently: Take 10 mg by mouth at bedtime. 02/25/21   Bhagat, Crista Luria, PA  ferric citrate (AURYXIA) 1 GM 210 MG(Fe) tablet Take 420 mg by mouth 3 (three) times daily with meals.    [provider]  gabapentin (NEURONTIN) 400 MG capsule Take 1 capsule (400 mg total) by mouth 2 (two) times daily. 06/17/20   Barton Dubois, MD  ipratropium (ATROVENT HFA) 17 MCG/ACT inhaler Inhale 2 puffs into the  lungs every 4 (four) hours as needed for wheezing.     [provider]  levothyroxine (SYNTHROID) 112 MCG tablet Take 112 mcg by mouth daily before breakfast.    [provider]  Methoxy PEG-Epoetin Beta (MIRCERA IJ) Mircera 05/28/21 05/27/22  [provider]  midodrine (PROAMATINE) 5 MG tablet Take 1 tablet (5 mg total) by mouth 3 (three) times daily with meals. 06/01/21   Isaiah Serge, NP  multivitamin (RENA-VIT) TABS tablet Take 1 tablet by mouth daily.    [provider]  nitroGLYCERIN (NITROSTAT) 0.4 MG SL tablet Place 1 tablet (0.4 mg total) under the tongue every 5 (five) minutes x 3 doses as needed for chest pain. 02/25/21   Bhagat, Bhavinkumar, PA  NOVOLIN 70/30 FLEXPEN (70-30) 100 UNIT/ML KwikPen Inject 24 Units into the skin in the morning and at bedtime. 06/30/20   [provider]  ondansetron (ZOFRAN) 8 MG tablet Take 8 mg by mouth every 8 (eight) hours as needed for nausea. 07/12/20   [provider]  [facility] ULTRA test strip Check blood glucose THREE TIMES DAILY 03/31/21   [provider]  oxyCODONE-acetaminophen (PERCOCET) 5-325 MG tablet Take 1 tablet by mouth every 6 (six) hours as needed for severe pain. Patient not taking: No sig reported 05/11/21 05/11/22  Baglia, Corrina, PA-C  pantoprazole (PROTONIX) 40 MG tablet Take 1 tablet (40 mg total) by mouth 2 (two) times daily. 06/17/20    Barton Dubois, MD  ranolazine (RANEXA) 500 MG 12 hr tablet Take 1 tablet (500 mg total) by mouth 2 (two) times daily. 07/09/20   Verta Ellen., NP  Vitamin D, Ergocalciferol, (DRISDOL) 1.25 MG (50000 UNIT) CAPS capsule Take 50,000 Units by mouth every Sunday.  12/31/19   [provider]     Allergies:    Allergies  Allergen Reactions   Penicillins Other (See Comments)    REACTION: Unknown, told as a child Has patient had a PCN reaction causing immediate rash, facial/tongue/throat swelling, SOB or lightheadedness with hypotension: Unknown Has patient had a PCN reaction causing severe rash involving mucus membranes or skin necrosis: Unknown Has patient had a PCN reaction that required hospitalization: Unknown Has patient had a PCN reaction occurring within the last 10 years: No If all of the above answers are "NO", then may proceed with Cephalosporin use.    Beta Adrenergic Blockers Other (See Comments)    Hypotension    Social History:   Social History   Socioeconomic History   Marital status: Divorced    Spouse name: Not on file   Number of children: Not on file   Years of education: Not on file   Highest education level: Not on file  Occupational History   Occupation: Disabled  Tobacco Use   Smoking status: Former    Packs/day: 1.00    Years: 20.00    Pack years: 20.00    Types: Cigarettes    Quit date: 12/10/2012    Years since quitting: 8.5   Smokeless tobacco: Never  Vaping Use   Vaping Use: Never used  Substance and Sexual Activity   Alcohol use: No    Alcohol/week: 0.0 standard drinks   Drug use: No   Sexual activity: Not Currently  Other Topics Concern   Not on file  Social History Narrative   Lives with mother.   Social Determinants of Health   Financial Resource Strain: Not on file  Food Insecurity: Not on file  Transportation Needs: Not on file  Physical Activity: Not on file  Stress: Not on file  Social Connections: Not on file   Intimate Partner Violence: Not on file    Family History:   The patient's family history includes AAA (abdominal aortic aneurysm) in her father; Diabetes in her mother; Heart disease in her brother and mother; Hyperlipidemia in her mother and son; Hypertension in her brother, father, mother, and son; Thyroid disease in her father.    ROS:  Please see the history of present illness. All other ROS reviewed and negative.     Physical Exam/Data:   Vitals:   06/14/21 0530 06/14/21 0631 06/14/21 0732 06/14/21 0745  BP: 111/60 115/62 (!) 83/60 (!) 81/70  Pulse: 79 75 75 74  Resp: 19 (!) 22 19 19   Temp:  97.7 F (36.5 C)    TempSrc:  Oral    SpO2: 97% 99% 92% 91%  Weight:      Height:       No intake or output data in the 24 hours ending 06/14/21 1006 Last 3 Weights 06/13/2021 06/01/2021 05/28/2021  Weight (lbs) 220 lb 214 lb 9.6 oz 210 lb  Weight (kg) 99.791 kg 97.342 kg 95.255 kg     Body mass index is 35.51 kg/m.  General:  Well nourished, well developed, in no acute distress HEENT: normal Neck: JVD somewhat elevated but difficult to assess secondary to body habitus Vascular: No carotid bruits; Distal pulses 1-2+ bilaterally   Cardiac:  normal S1, S2; RRR; no murmur  Lungs: Essentially  clear to auscultation bilaterally, mild wheezing, rhonchi or rales  Abd: soft, nontender, no hepatomegaly  Ext: no edema Musculoskeletal:  No deformities, BUE and BLE strength normal and equal Skin: warm and dry  Neuro:  CNs 2-12 intact, no focal abnormalities noted Psych:  Normal affect    EKG:  The ECG that was done today was personally reviewed and demonstrates this rhythm, heart rate 89, diffuse ST depression  Relevant CV Studies:  CARDIAC CATH: 02/24/2021 Ost LM to Dist LM lesion is 95% stenosed. LIMA to LAD is patent. Prox LAD to Mid LAD lesion is 100% stenosed. Known from prior catheterization. Unable to advance left catheter through the severely diseased subclavian stent. Ost LAD to  Prox LAD lesion is 100% stenosed. 1st Mrg-1 lesion is 99% stenosed. SVG to OM occluded. 1st Mrg-2 lesion is 100% stenosed. Ost Cx to Mid Cx lesion is 99% stenosed. Origin to Prox Graft lesion before 1st Mrg is 100% stenosed. LIMA and is normal in caliber. Ostial LIMA stent is 60% narrowed. Prox RCA lesion is 100% stenosed. SVG to PDA showed prox Graft lesion is 75% stenosed. Scoring balloon angioplasty was performed using a BALLOON SCOREFLEX 3.0X10. Multipurpose guide catheter was able to track through the subclavian stent. Post intervention, there is a 0% residual stenosis. SVG to PDA showed mid Graft lesion is 80% stenosed. A drug-eluting stent was successfully placed using a SYNERGY XD 3.50X16. Post intervention, there is a 0% residual stenosis. LV end diastolic pressure is moderately elevated. There is no aortic valve stenosis. Origin lesion is 20% stenosed.   Due to the severe in-stent restenosis of the subclavian stent, Dr. Carlis Abbott was called.  I suspect this was causing anterior wall ischemia due to compromising flow into the LIMA.  There was a 30 mm gradient across the stent.  There was clear difficulty getting equipment across the stent to the heart as noted in the report.  He is planning to place a covered stent over the  disease to bare-metal stent which was previously placed.   From a cardiac standpoint, continue aggressive secondary prevention.  She will need dual antiplatelet therapy.  Intervention     ECHO: Ordered  ECHO: 06/19/2020  1. Left ventricular ejection fraction, by estimation, is 55 to 60%. The  left ventricle has normal function. The left ventricle has no regional  wall motion abnormalities.   2. The mitral valve is normal in structure. Mild to moderate mitral valve  regurgitation. No evidence of mitral stenosis.   3. Limited echocardiogram to reevalaute LV function and wall motion.   Laboratory Data:  High Sensitivity Troponin:   Recent Labs  Lab  05/28/21 1129 05/28/21 1320 06/13/21 2143 06/13/21 2320 06/14/21 0444  TROPONINIHS 18* 19* 55* 523* 12,628*      Chemistry Recent Labs  Lab 06/13/21 2143 06/14/21 0444  NA 135 136  K 3.7 4.3  CL 97* 98  CO2 22 25  GLUCOSE 341* 177*  BUN 60* 63*  CREATININE 7.36* 7.63*  CALCIUM 9.5 9.2  MG  --  2.2  GFRNONAA 6* 6*  ANIONGAP 16* 13    Recent Labs  Lab 06/14/21 0444  PROT 6.7  ALBUMIN 3.2*  AST 58*  ALT 17  ALKPHOS 75  BILITOT 0.3   Lipids  Lab Results  Component Value Date   CHOL 168 02/23/2021   HDL 41 02/23/2021   LDLCALC 76 02/23/2021   TRIG 257 (H) 02/23/2021   CHOLHDL 4.1 02/23/2021    Hematology Recent Labs  Lab 06/13/21 2143 06/14/21 0444  WBC 12.6* 12.3*  RBC 3.76* 3.51*  HGB 12.9 11.7*  HCT 39.4 37.1  MCV 104.8* 105.7*  MCH 34.3* 33.3  MCHC 32.7 31.5  RDW 16.0* 16.2*  PLT 180 156   Thyroid No results for input(s): TSH, FREET4 in the last 168 hours. BNPNo results for input(s): BNP, PROBNP in the last 168 hours.  DDimer No results for input(s): DDIMER in the last 168 hours.   Radiology/Studies:  DG Chest 2 View  Result Date: 06/13/2021 CLINICAL DATA:  Chest pain EXAM: CHEST - 2 VIEW COMPARISON:  05/28/2021 FINDINGS: The lungs are symmetrically well expanded. Trace interstitial infiltrate has developed diffusely most in keeping with mild interstitial pulmonary edema. No pneumothorax or pleural effusion. Right internal jugular hemodialysis catheter tip noted within the right atrium. Cardiac size within normal limits. Median sternotomy has been performed. No acute bone abnormality. IMPRESSION: Interval development of mild interstitial pulmonary edema. Electronically Signed   By: Fidela Salisbury M.D.   On: 06/13/2021 22:17     Assessment and Plan:   NSTEMI: -Symptoms and cardiac enzymes at increased - Cardiac catheterization is indicated - Cardiac catheterization was discussed with the patient fully. The patient understands that risks  include but are not limited to stroke (1 in 1000), death (1 in 38), kidney failure [usually temporary] (1 in 500), bleeding (1 in 200), allergic reaction [possibly serious] (1 in 200).  The patient understands and is willing to proceed.   -Nitropaste removed because of hypotension - Use morphine as needed for pain control until the cath.  2.  ESRD on HD: - Management per IM/Nephrology - Patient feels she is volume up at this time and would normally have dialysis today. - She is not significantly volume overloaded on exam, feel we can do the cath prior to dialysis  3.  Hypotension: - Chronic problem, her blood pressure was a little higher in her left forearm, but she has an old dialysis  graft in the [facility] space and a left subclavian stent, so not sure this is accurate - She is mentating well  Otherwise, per IM  Risk Assessment/Risk Scores:    TIMI Risk Score for Unstable Angina or Non-ST Elevation MI:   The patient's TIMI risk score is  , which indicates a  % risk of all cause mortality, new or recurrent myocardial infarction or need for urgent revascularization in the next 14 days.   For questions or updates, please contact [place] Please consult [email or web address] for contact info under     Signed, Rosaria Ferries, PA-C  06/14/2021 10:06 AM

## 2021-06-14 NOTE — Consult Note (Signed)
[facility] Renal Consultation Note    Indication for Consultation:  Management of ESRD/hemodialysis, anemia, hypertension/volume, and secondary hyperparathyroidism. PCP:  HPI: Donna Howe is a 65 y.o. female with ESRD, severe CAD s/p prior CABG/multiple PCI but deemed too high risk for further bypass, T2DM, HTN, hypothyroidism who was admitted with NSTEMI.  Presented to [facility] early this morning with intermittent CP/dyspnea for 2 days, then acutely worse CP last evening which was not relief with NTG. EKG without ST elevation (had ST depressions in lateral leads). Labs with K 3.7, WBC 12.6, Hgb 12.9, but troponin rapidly rising 55 -> 523 -> 12628. transferred to [facility] and cardiology consulted with plan for repeat LHC today.  Seen in ED bed -- reports CP and dyspnea improved at the moment. No N/V/D. Does have sensation of having extra fluid on in her abdomen. has been meeting her EDW consistently as an outpatient but this may need to be lowered.  Dialyzes on TTS schedule at [facility], via [facility]. She has new RUE AVF which is maturing. Denies recent HD issues.  Past Medical History:  Diagnosis Date   Anemia    Anxiety    Asthma    CAD (coronary artery disease)    Multivessel s/p CABG 2005, numerous PCIs since that time and documented graft disease   Carotid artery disease ([place])    R CEA   ESRD on hemodialysis ([place])    Essential hypertension    Gout    History of blood transfusion    Hyperlipidemia    Hypothyroidism    Myocardial infarction [facility])    PAD (peripheral artery disease) ([place])    Dr. Kellie Simmering   Pneumonia 09/2019, 11/2019   S/P angioplasty with stent- DES to mRCA and to LIMA to LAD with DES 04/09/18.   04/10/2018   SBO (small bowel obstruction) ([place]) 2011   Status post lysis of adhesions & hernia repair   Sinus bradycardia    Type 2 diabetes mellitus ([place])    Umbilical hernia    Past Surgical History:  Procedure Laterality Date   AORTIC ARCH  ANGIOGRAPHY N/A 08/02/2020   Procedure: AORTIC ARCH ANGIOGRAPHY;  Surgeon: Waynetta Sandy, MD;  Location: [place] CV LAB;  Service: Cardiovascular;  Laterality: N/A;  Lt upper extermity   AV FISTULA PLACEMENT Left 06/29/2020   Procedure: LEFT ARM ARTERIOVENOUS (AV) FISTULA;  Surgeon: Waynetta Sandy, MD;  Location: [place];  Service: Vascular;  Laterality: Left;  ARM   AV FISTULA PLACEMENT Right 05/11/2021   Procedure: RIGHT BRACHIOCEPHALIC ARTERIOVENOUS (AV) FISTULA CREATION;  Surgeon: Waynetta Sandy, MD;  Location: [facility] OR;  Service: Vascular;  Laterality: Right;   [place]  2010   CORONARY ARTERY BYPASS GRAFT  2005   CORONARY BALLOON ANGIOPLASTY N/A 05/31/2017   Procedure: CORONARY BALLOON ANGIOPLASTY;  Surgeon: Jettie Booze, MD;  Location: [place] CV LAB;  Service: Cardiovascular;  Laterality: N/A;   CORONARY STENT INTERVENTION N/A 05/31/2017   Procedure: CORONARY STENT INTERVENTION;  Surgeon: Jettie Booze, MD;  Location: [place] CV LAB;  Service: Cardiovascular;  Laterality: N/A;   CORONARY STENT INTERVENTION N/A 04/09/2018   Procedure: CORONARY STENT INTERVENTION;  Surgeon: Jettie Booze, MD;  Location: [place] CV LAB;  Service: Cardiovascular;  Laterality: N/A;  SVG RCA   CORONARY STENT INTERVENTION N/A 01/10/2021   Procedure: CORONARY STENT INTERVENTION;  Surgeon: Leonie Man, MD;  Location: [place] CV LAB;  Service: Cardiovascular;  Laterality: N/A;   CORONARY/GRAFT ACUTE MI REVASCULARIZATION N/A 02/24/2021   Procedure: Coronary/Graft Acute MI Revascularization;  Surgeon: Jettie Booze, MD;  Location: [place] CV LAB;  Service: Cardiovascular;  Laterality: N/A;   ENDARTERECTOMY Right 04/18/2013   Procedure: ENDARTERECTOMY CAROTID;  Surgeon: Mal Misty, MD;  Location: [place];  Service: Vascular;  Laterality: Right;   HERNIA REPAIR  1989   Incisional hernia repair x2  03/04/2010    Laparoscopic with 35cm mesh by Dr Ronnald Collum   IR [place] CV LINE RIGHT  06/21/2020   IR US GUIDE VASC ACCESS RIGHT  06/21/2020   LEFT HEART CATH AND CORS/GRAFTS ANGIOGRAPHY N/A 05/31/2017   Procedure: LEFT HEART CATH AND CORS/GRAFTS ANGIOGRAPHY;  Surgeon: Jettie Booze, MD;  Location: [place] CV LAB;  Service: Cardiovascular;  Laterality: N/A;   LEFT HEART CATH AND CORS/GRAFTS ANGIOGRAPHY N/A 04/08/2018   Procedure: LEFT HEART CATH AND CORS/GRAFTS ANGIOGRAPHY;  Surgeon: Jettie Booze, MD;  Location: [place] CV LAB;  Service: Cardiovascular;  Laterality: N/A;   LEFT HEART CATH AND CORS/GRAFTS ANGIOGRAPHY N/A 06/22/2020   Procedure: LEFT HEART CATH AND CORS/GRAFTS ANGIOGRAPHY;  Surgeon: Belva Crome, MD;  Location: [place] CV LAB;  Service: Cardiovascular;  Laterality: N/A;   LEFT HEART CATH AND CORS/GRAFTS ANGIOGRAPHY N/A 01/10/2021   Procedure: LEFT HEART CATH AND CORS/GRAFTS ANGIOGRAPHY;  Surgeon: Leonie Man, MD;  Location: [place] CV LAB;  Service: Cardiovascular;  Laterality: N/A;   LEFT HEART CATH AND CORS/GRAFTS ANGIOGRAPHY N/A 02/24/2021   Procedure: LEFT HEART CATH AND CORS/GRAFTS ANGIOGRAPHY;  Surgeon: Jettie Booze, MD;  Location: [place] CV LAB;  Service: Cardiovascular;  Laterality: N/A;   LEFT HEART CATHETERIZATION WITH CORONARY ANGIOGRAM N/A 12/19/2012   Procedure: LEFT HEART CATHETERIZATION WITH CORONARY ANGIOGRAM;  Surgeon: Josue Hector, MD;  Location: [facility] CATH LAB;  Service: Cardiovascular;  Laterality: N/A;   LEFT HEART CATHETERIZATION WITH CORONARY/GRAFT ANGIOGRAM N/A 04/19/2013   Procedure: LEFT HEART CATHETERIZATION WITH Beatrix Fetters;  Surgeon: Lorretta Harp, MD;  Location: [facility] CATH LAB;  Service: Cardiovascular;  Laterality: N/A;   LIGATION OF ARTERIOVENOUS  FISTULA Left 09/15/2020   Procedure: LIGATION OF LEFT ARM ARTERIOVENOUS  FISTULA;  Surgeon: Waynetta Sandy, MD;  Location: [place];  Service: Vascular;   Laterality: Left;   PATCH ANGIOPLASTY Right 04/18/2013   Procedure: PATCH ANGIOPLASTY Right Internal Carotid Artery;  Surgeon: Mal Misty, MD;  Location: [place];  Service: Vascular;  Laterality: Right;   PERCUTANEOUS CORONARY STENT INTERVENTION (PCI-S) Right 12/19/2012   Procedure: PERCUTANEOUS CORONARY STENT INTERVENTION (PCI-S);  Surgeon: Josue Hector, MD;  Location: [facility] CATH LAB;  Service: Cardiovascular;  Laterality: Right;   PERIPHERAL VASCULAR INTERVENTION Left 08/02/2020   Procedure: PERIPHERAL VASCULAR INTERVENTION;  Surgeon: Waynetta Sandy, MD;  Location: [place] CV LAB;  Service: Cardiovascular;  Laterality: Left;  Left subclavian   PERIPHERAL VASCULAR INTERVENTION  02/24/2021   Procedure: PERIPHERAL VASCULAR INTERVENTION;  Surgeon: Marty Heck, MD;  Location: [place] CV LAB;  Service: Vascular;;   SHOULDER SURGERY     Family History  Problem Relation Age of Onset   Diabetes Mother    Heart disease Mother        before age 42   Hyperlipidemia Mother    Hypertension Mother    Thyroid disease Father    Hypertension Father    AAA (abdominal aortic aneurysm) Father    Heart disease Brother  before age 26   Hypertension Brother    Hyperlipidemia Son    Hypertension Son    Social History:  reports that she quit smoking about 8 years ago. Her smoking use included cigarettes. She has a 20.00 pack-year smoking history. She has never used smokeless tobacco. She reports that she does not drink alcohol and does not use drugs.  ROS: As per HPI otherwise negative.  Physical Exam: Vitals:   06/14/21 0530 06/14/21 0631 06/14/21 0732 06/14/21 0745  BP: 111/60 115/62 (!) 83/60 (!) 81/70  Pulse: 79 75 75 74  Resp: 19 (!) 22 19 19   Temp:  97.7 F (36.5 C)    TempSrc:  Oral    SpO2: 97% 99% 92% 91%  Weight:      Height:         General: Well developed, well nourished, in no acute distress. Nasal O2 in place. Head: Normocephalic, atraumatic, sclera  non-icteric, mucus membranes are moist. Neck: Supple without lymphadenopathy/masses. JVD not elevated. Lungs: Clear bilaterally to auscultation without wheezes, rales, or rhonchi.  Heart: RRR; 2/6 murmur Abdomen: Soft, non-tender, non-distended with normoactive bowel sounds. Musculoskeletal:  Strength and tone appear normal for age. Lower extremities: 1+ RLE edema, no LLE edema Neuro: Alert and oriented X 3. Moves all extremities spontaneously. Psych:  Responds to questions appropriately with a normal affect. Dialysis Access: TDC + new RUE AVF + bruit (maturing)  Allergies  Allergen Reactions   Penicillins Other (See Comments)    REACTION: Unknown, told as a child Has patient had a PCN reaction causing immediate rash, facial/tongue/throat swelling, SOB or lightheadedness with hypotension: Unknown Has patient had a PCN reaction causing severe rash involving mucus membranes or skin necrosis: Unknown Has patient had a PCN reaction that required hospitalization: Unknown Has patient had a PCN reaction occurring within the last 10 years: No If all of the above answers are "NO", then may proceed with Cephalosporin use.    Beta Adrenergic Blockers Other (See Comments)    Hypotension   Prior to Admission medications   Medication Sig Start Date End Date Taking? Authorizing Provider  Albuterol Sulfate 108 (90 Base) MCG/ACT AEPB Inhale 2 puffs into the lungs every 6 (six) hours as needed (Shortness of breath).   Yes [provider]  allopurinol (ZYLOPRIM) 100 MG tablet Take one tablet after hemodialysis, on Tuesday, Thursday and Saturday. Patient taking differently: Take 100 mg by mouth Every Tuesday,Thursday,and Saturday with dialysis. After dialysis 06/30/20  Yes Arrien, Jimmy Picket, MD  ALPRAZolam Duanne Moron) 0.5 MG tablet Take 0.25-0.5 mg by mouth See admin instructions. Take 0.25 in the morning and evening and 0.5 mg at bedtime   Yes [provider]  aspirin EC 81 MG tablet  Take 81 mg by mouth daily.    Yes [provider]  atorvastatin (LIPITOR) 80 MG tablet Take 1 tablet (80 mg total) by mouth every evening. Patient taking differently: Take 80 mg by mouth at bedtime. 12/19/19  Yes Johnson, Clanford L, MD  clopidogrel (PLAVIX) 75 MG tablet Take 1 tablet (75 mg total) by mouth daily. 08/27/18  Yes Satira Sark, MD  diclofenac Sodium (VOLTAREN) 1 % GEL Apply 2-4 g topically daily as needed (FOR NECK PAIN).   Yes [provider]  ezetimibe (ZETIA) 10 MG tablet Take 1 tablet (10 mg total) by mouth daily. Patient taking differently: Take 10 mg by mouth at bedtime. 02/25/21  Yes Bhagat, Bhavinkumar, PA  ferric citrate (AURYXIA) 1 GM 210 MG(Fe) tablet Take  420 mg by mouth 3 (three) times daily with meals.   Yes [provider]  gabapentin (NEURONTIN) 400 MG capsule Take 1 capsule (400 mg total) by mouth 2 (two) times daily. 06/17/20  Yes Barton Dubois, MD  ipratropium (ATROVENT HFA) 17 MCG/ACT inhaler Inhale 2 puffs into the lungs every 4 (four) hours as needed for wheezing.    Yes [provider]  levothyroxine (SYNTHROID) 112 MCG tablet Take 112 mcg by mouth daily before breakfast.   Yes [provider]  midodrine (PROAMATINE) 5 MG tablet Take 1 tablet (5 mg total) by mouth 3 (three) times daily with meals. 06/01/21  Yes Isaiah Serge, NP  multivitamin (RENA-VIT) TABS tablet Take 1 tablet by mouth daily.   Yes [provider]  nitroGLYCERIN (NITROSTAT) 0.4 MG SL tablet Place 1 tablet (0.4 mg total) under the tongue every 5 (five) minutes x 3 doses as needed for chest pain. 02/25/21  Yes Bhagat, Bhavinkumar, PA  NOVOLIN 70/30 FLEXPEN (70-30) 100 UNIT/ML KwikPen Inject 24 Units into the skin in the morning and at bedtime. 06/30/20  Yes [provider]  ondansetron (ZOFRAN) 8 MG tablet Take 8 mg by mouth every 8 (eight) hours as needed for nausea. 07/12/20  Yes [provider]  pantoprazole (PROTONIX)  40 MG tablet Take 1 tablet (40 mg total) by mouth 2 (two) times daily. 06/17/20  Yes Barton Dubois, MD  ranolazine (RANEXA) 500 MG 12 hr tablet Take 1 tablet (500 mg total) by mouth 2 (two) times daily. Patient taking differently: Take 500 mg by mouth at bedtime. 07/09/20  Yes Verta Ellen., NP  Vitamin D, Ergocalciferol, (DRISDOL) 1.25 MG (50000 UNIT) CAPS capsule Take 50,000 Units by mouth every Sunday.  12/31/19  Yes [provider]  Methoxy PEG-Epoetin Beta (MIRCERA IJ) Mircera 05/28/21 05/27/22  [provider]  [facility] ULTRA test strip Check blood glucose THREE TIMES DAILY 03/31/21   [provider]  oxyCODONE-acetaminophen (PERCOCET) 5-325 MG tablet Take 1 tablet by mouth every 6 (six) hours as needed for severe pain. Patient not taking: No sig reported 05/11/21 05/11/22  Karoline Caldwell, PA-C   Current Facility-Administered Medications  Medication Dose Route Frequency Provider Last Rate Last Admin   albuterol (PROVENTIL) (2.5 MG/3ML) 0.083% nebulizer solution 3 mL  3 mL Inhalation Q6H PRN Adefeso, Oladapo, DO       aspirin EC tablet 81 mg  81 mg Oral Daily Adefeso, Oladapo, DO   81 mg at 06/14/21 1022   atorvastatin (LIPITOR) tablet 80 mg  80 mg Oral QPM Adefeso, Oladapo, DO       clopidogrel (PLAVIX) tablet 75 mg  75 mg Oral Daily Adefeso, Oladapo, DO   75 mg at 06/14/21 1022   ezetimibe (ZETIA) tablet 10 mg  10 mg Oral Daily Adefeso, Oladapo, DO   10 mg at 06/14/21 1022   heparin ADULT infusion 100 units/mL (25000 units/278mL)  900 Units/hr Intravenous Continuous Adefeso, Oladapo, DO 9 mL/hr at 06/14/21 0714 900 Units/hr at 06/14/21 0714   insulin aspart (novoLOG) injection 0-5 Units  0-5 Units Subcutaneous QHS Adefeso, Oladapo, DO       insulin aspart (novoLOG) injection 0-6 Units  0-6 Units Subcutaneous TID WC Adefeso, Oladapo, DO       levothyroxine (SYNTHROID) tablet 112 mcg  112 mcg Oral QAC breakfast Adefeso, Oladapo, DO       midodrine (PROAMATINE)  tablet 5 mg  5 mg Oral TID WC Adefeso, Oladapo, DO   5 mg  at 06/14/21 6546   Current Outpatient Medications  Medication Sig Dispense Refill   Albuterol Sulfate 108 (90 Base) MCG/ACT AEPB Inhale 2 puffs into the lungs every 6 (six) hours as needed (Shortness of breath).     allopurinol (ZYLOPRIM) 100 MG tablet Take one tablet after hemodialysis, on Tuesday, Thursday and Saturday. (Patient taking differently: Take 100 mg by mouth Every Tuesday,Thursday,and Saturday with dialysis. After dialysis) 30 tablet 11   ALPRAZolam (XANAX) 0.5 MG tablet Take 0.25-0.5 mg by mouth See admin instructions. Take 0.25 in the morning and evening and 0.5 mg at bedtime     aspirin EC 81 MG tablet Take 81 mg by mouth daily.      atorvastatin (LIPITOR) 80 MG tablet Take 1 tablet (80 mg total) by mouth every evening. (Patient taking differently: Take 80 mg by mouth at bedtime.)     clopidogrel (PLAVIX) 75 MG tablet Take 1 tablet (75 mg total) by mouth daily. 90 tablet 3   diclofenac Sodium (VOLTAREN) 1 % GEL Apply 2-4 g topically daily as needed (FOR NECK PAIN).     ezetimibe (ZETIA) 10 MG tablet Take 1 tablet (10 mg total) by mouth daily. (Patient taking differently: Take 10 mg by mouth at bedtime.) 30 tablet 11   ferric citrate (AURYXIA) 1 GM 210 MG(Fe) tablet Take 420 mg by mouth 3 (three) times daily with meals.     gabapentin (NEURONTIN) 400 MG capsule Take 1 capsule (400 mg total) by mouth 2 (two) times daily.     ipratropium (ATROVENT HFA) 17 MCG/ACT inhaler Inhale 2 puffs into the lungs every 4 (four) hours as needed for wheezing.      levothyroxine (SYNTHROID) 112 MCG tablet Take 112 mcg by mouth daily before breakfast.     midodrine (PROAMATINE) 5 MG tablet Take 1 tablet (5 mg total) by mouth 3 (three) times daily with meals. 90 tablet 11   multivitamin (RENA-VIT) TABS tablet Take 1 tablet by mouth daily.     nitroGLYCERIN (NITROSTAT) 0.4 MG SL tablet Place 1 tablet (0.4 mg total) under the tongue every 5 (five)  minutes x 3 doses as needed for chest pain. 100 tablet 3   NOVOLIN 70/30 FLEXPEN (70-30) 100 UNIT/ML KwikPen Inject 24 Units into the skin in the morning and at bedtime.     ondansetron (ZOFRAN) 8 MG tablet Take 8 mg by mouth every 8 (eight) hours as needed for nausea.     pantoprazole (PROTONIX) 40 MG tablet Take 1 tablet (40 mg total) by mouth 2 (two) times daily. 60 tablet 1   ranolazine (RANEXA) 500 MG 12 hr tablet Take 1 tablet (500 mg total) by mouth 2 (two) times daily. (Patient taking differently: Take 500 mg by mouth at bedtime.) 60 tablet 1   Vitamin D, Ergocalciferol, (DRISDOL) 1.25 MG (50000 UNIT) CAPS capsule Take 50,000 Units by mouth every Sunday.      Methoxy PEG-Epoetin Beta (MIRCERA IJ) Mircera     ONETOUCH ULTRA test strip Check blood glucose THREE TIMES DAILY     oxyCODONE-acetaminophen (PERCOCET) 5-325 MG tablet Take 1 tablet by mouth every 6 (six) hours as needed for severe pain. (Patient not taking: No sig reported) 8 tablet 0   Labs: Basic Metabolic Panel: Recent Labs  Lab 06/13/21 2143 06/14/21 0444  NA 135 136  K 3.7 4.3  CL 97* 98  CO2 22 25  GLUCOSE 341* 177*  BUN 60* 63*  CREATININE 7.36* 7.63*  CALCIUM 9.5 9.2  PHOS  --  7.6*   Liver Function Tests: Recent Labs  Lab 06/14/21 0444  AST 58*  ALT 17  ALKPHOS 75  BILITOT 0.3  PROT 6.7  ALBUMIN 3.2*   CBC: Recent Labs  Lab 06/13/21 2143 06/14/21 0444  WBC 12.6* 12.3*  HGB 12.9 11.7*  HCT 39.4 37.1  MCV 104.8* 105.7*  PLT 180 156   Studies/Results: DG Chest 2 View  Result Date: 06/13/2021 CLINICAL DATA:  Chest pain EXAM: CHEST - 2 VIEW COMPARISON:  05/28/2021 FINDINGS: The lungs are symmetrically well expanded. Trace interstitial infiltrate has developed diffusely most in keeping with mild interstitial pulmonary edema. No pneumothorax or pleural effusion. Right internal jugular hemodialysis catheter tip noted within the right atrium. Cardiac size within normal limits. Median sternotomy has  been performed. No acute bone abnormality. IMPRESSION: Interval development of mild interstitial pulmonary edema. Electronically Signed   By: Fidela Salisbury M.D.   On: 06/13/2021 22:17    Dialysis Orders:  TTS at [facility] 4:15hr, 400/500, EDW 95kg, 2K/2.5Ca, TDC, heparin 2000 bolus - venofer 100 x 10 -- nearly complete - Mircera 71mcg Iv q 4 weeks - last 9/13 - Calcitriol 70mcg PO q HD  Assessment/Plan:  NSTEMI/severe CAD: Trop > 12K. Cards consulted with plan for LHC today. Prev deemed too high risk for CABG.  ESRD: Usual TTS schedule - due for HD today. Depending on timing of planned cath, will likely dialyze late tonight or possibly tomorrow AM.  Hypertension/volume: BP low sided, she has some RLE edema and says stomach "feels full like I have extra fluid." CXR with mild pulm edema. Will ^ UF with next HD.  Anemia: Hgb 11.7 - no ESA needed for now.  Metabolic bone disease: Ca ok, Phos ^ - resume binders once cleared to eat.   Veneta Penton, PA-C 06/14/2021, 12:03 PM  [facility]

## 2021-06-14 NOTE — ED Notes (Signed)
Pt arrived from [facility]. Pt states that her chest still feels heavy but  t has improved.  Pt is A&Ox4, respirations are even and non-labored   Pt already in gown and placed on cardiac monitoring, heparin infusion

## 2021-06-14 NOTE — Progress Notes (Signed)
Site area: Right groin a 6 french arterial sheath was removed  Site Prior to Removal:  Level 0  Pressure Applied For 20 MINUTES    Bedrest Beginning at 2000p X 4 hours  Manual:   Yes.    Patient Status During Pull:  stable  Post Pull Groin Site:  Level 0  Post Pull Instructions Given:  Yes.    Post Pull Pulses Present:  Yes.    Dressing Applied:  Yes.    Comments:

## 2021-06-14 NOTE — Progress Notes (Addendum)
PROGRESS NOTE    Harjit Douds Martino  ZJI:[id number] DOB: 1956-07-16 DOA: 06/13/2021 PCP: Curlene Labrum, MD    Brief Narrative:  Mrs. Williard was admitted to the hospital with the working diagnosis of unstable angina, NSTEMI.   65 yo female with the past medical history of CAD sp CABG (2005), ESRD on HD, T2DM and obesity who presented with chest pain. Intermittent chest pain for 2 days, worsening, precordial, pressure in nature, 10/10 in intensity, radiated to the left shoulder and associated with diaphoresis.   Patient has chronic stable angina, using nitroglycerin for pain control.  Over the last 2 weeks she had worsening angina symptoms consistent with unstable angina, yesterday afternoon around 5 PM her pain she had another episode of severe chest pain, nonexertional related, did not improve with nitroglycerin prompting her to come to the hospital.  She has an extensive history of coronary artery disease and stable angina difficult to control.  Blood pressure has been a limiting factor for medical therapy. She follows with Dr. Domenic Polite in the cardiology clinic.   In April 2022 patient had a non-STEMI, had 2 drug-eluting stents placed to the SVG-RCA. In June 2022 she had another ST elevation myocardial infarction, 75% stenosis along the SVG to PDA which was treated with balloon angioplasty and drug-eluting stent placement to 80% stenosis along the mid graft lesion.  She had severe in-stent restenosis of her subclavian stent compromising flow into the LIMA.  Patient had angioplasty and stenting by Dr. Carlis Abbott.  Sodium 135, potassium 3.7, chloride 97, bicarb 22, glucose 341, BUN 60, creatinine 7.36. White count 12.6, hemoglobin 12.9, hematocrit 39.4, platelets 180.  High sensitive troponin [id number] SARS COVID 19 negative.  Chest radiograph with mild hilar vascular congestion.  Dialysis catheter right internal jugular vein.  Initial EKG (09/26)rate 103 bpm, normal axis, Qtc 505, sinus  rhythm, St depression II -AVF, V4- V6 with no T wave changes. (ST changed more prominent than old EKG). 09/27  persistent ST depressions and T wave inversions on I and AVL. (Had T later T wave inversions in the past).   Patient has been placed on heparin drip.    Assessment & Plan:   Principal Problem:   NSTEMI (non-ST elevated myocardial infarction) ([place]) Active Problems:   Mixed hyperlipidemia   Obesity (BMI 30-39.9)   CAD (coronary artery disease)   Hx of CABG   S/P angioplasty with stent- DES to [facility] and to LIMA to LAD with DES 04/09/18.     Hypothyroidism (acquired)   Elevated troponin I level   Hyperglycemia due to diabetes mellitus (HCC)   GERD (gastroesophageal reflux disease)   ESRD on dialysis (HCC)   Leukocytosis   Elevated MCV   Pulmonary edema   NSTEMI/ CAD sp CABG and PCI in the past. Patient with clear unstable angina symptoms, now with worsening troponin elevation.  Currently her chest pain has improved but not completely resolved.  Her EKG has ischemic changes that seem to be more pronounced compared to old EKG.  Her blood pressure has been low 83/60 and 81/70 mmHg, with blood pressure cuff in her left leg. HR 60 to 70 bpm.   Plan:  Continue with IV heparin drip per protocol.  Continue antiplatelet therapy with aspirin and clopidogrel. Hold on B blocker, ace inh, nitrates for for now due to risk of hypotension.  I have called cardiology consultation this morning and will change patient to NPO for now.  Check echocardiogram.  Study from 2021 patient had a  preserved LV systolic function with EF 55 to 60%, no wall motion abnormalities, mild to moderate mitral regurgitation.   2. ESRD on HD. T-TH-Sat Patient with no clinical signs of hypervolemia, her K is 4,3 and serum bicarbonate 25, with BUN of 63. Considering her NSTEMI and hypotension, likely will be better to hold on renal replacement therapy for today. Will consult nephrology.   3. T2DM/ dyslipidemia  fasting glucose this am is 177, continue insulin sliding scale for glucose cover and monitoring.  Will change patient to NPO for now.   Continue with statin therapy.   4. Hypothyroid. Continue with levothyroxine.   5. Obesity class 2. Calculated BMI is 35,5    Patient continue to be at high risk for worsening acute coronary syndrome   Status is: Inpatient  Remains inpatient appropriate because:Inpatient level of care appropriate due to severity of illness  Dispo: The patient is from: Home              Anticipated d/c is to: Home              Patient currently is not medically stable to d/c.   Difficult to place patient No   DVT prophylaxis: Heparin drip   Code Status:    full  Family Communication:   No family at the bedside      Consultants:  Cardiology and nephrology    Subjective: Patient with improvement in her chest pain but not completely resolved, no nausea or vomiting, no dyspnea.   Objective: Vitals:   06/14/21 0530 06/14/21 0631 06/14/21 0732 06/14/21 0745  BP: 111/60 115/62 (!) 83/60 (!) 81/70  Pulse: 79 75 75 74  Resp: 19 (!) 22 19 19   Temp:  97.7 F (36.5 C)    TempSrc:  Oral    SpO2: 97% 99% 92% 91%  Weight:      Height:       No intake or output data in the 24 hours ending 06/14/21 0824 Filed Weights   06/13/21 2119  Weight: 99.8 kg    Examination:   General: Not in pain or dyspnea,  Neurology: Awake and alert, non focal  E ENT: no pallor, no icterus, oral mucosa moist Cardiovascular: No JVD. S1-S2 present, rhythmic, no gallops, rubs, or murmurs. No lower extremity edema. Pulmonary: positive breath sounds bilaterally, with no wheezing, rhonchi or rales. Gastrointestinal. Abdomen soft and non tender Skin. No rashes Musculoskeletal: no joint deformities     Data Reviewed: I have personally reviewed following labs and imaging studies  CBC: Recent Labs  Lab 06/13/21 2143 06/14/21 0444  WBC 12.6* 12.3*  HGB 12.9 11.7*  HCT 39.4  37.1  MCV 104.8* 105.7*  PLT 180 161   Basic Metabolic Panel: Recent Labs  Lab 06/13/21 2143 06/14/21 0444  NA 135 136  K 3.7 4.3  CL 97* 98  CO2 22 25  GLUCOSE 341* 177*  BUN 60* 63*  CREATININE 7.36* 7.63*  CALCIUM 9.5 9.2  MG  --  2.2  PHOS  --  7.6*   GFR: Estimated Creatinine Clearance: 9.1 mL/min (A) (by C-G formula based on SCr of 7.63 mg/dL (H)). Liver Function Tests: Recent Labs  Lab 06/14/21 0444  AST 58*  ALT 17  ALKPHOS 75  BILITOT 0.3  PROT 6.7  ALBUMIN 3.2*   No results for input(s): LIPASE, AMYLASE in the last 168 hours. No results for input(s): AMMONIA in the last 168 hours. Coagulation Profile: Recent Labs  Lab 06/14/21 0444  INR  1.1   Cardiac Enzymes: No results for input(s): CKTOTAL, CKMB, CKMBINDEX, TROPONINI in the last 168 hours. BNP (last 3 results) No results for input(s): PROBNP in the last 8760 hours. HbA1C: No results for input(s): HGBA1C in the last 72 hours. CBG: Recent Labs  Lab 06/14/21 0814  GLUCAP 143*   Lipid Profile: No results for input(s): CHOL, HDL, LDLCALC, TRIG, CHOLHDL, LDLDIRECT in the last 72 hours. Thyroid Function Tests: No results for input(s): TSH, T4TOTAL, FREET4, T3FREE, THYROIDAB in the last 72 hours. Anemia Panel: Recent Labs    06/14/21 0444  VITAMINB12 305  FOLATE 51.3      Radiology Studies: I have reviewed all of the imaging during this hospital visit personally     Scheduled Meds:  aspirin EC  81 mg Oral Daily   atorvastatin  80 mg Oral QPM   clopidogrel  75 mg Oral Daily   ezetimibe  10 mg Oral Daily   insulin aspart  0-5 Units Subcutaneous QHS   insulin aspart  0-6 Units Subcutaneous TID WC   levothyroxine  112 mcg Oral QAC breakfast   midodrine  5 mg Oral TID WC   Continuous Infusions:  heparin 900 Units/hr (06/14/21 0714)     LOS: 0 days        Langston [place]field Gerome Apley, MD

## 2021-06-14 NOTE — ED Notes (Signed)
Pt eating breakfast 

## 2021-06-14 NOTE — Interval H&P Note (Signed)
Cath Lab Visit (complete for each Cath Lab visit)  Clinical Evaluation Leading to the Procedure:   ACS: Yes.    Non-ACS:    Anginal Classification: CCS IV  Anti-ischemic medical therapy: Minimal Therapy (1 class of medications)  Non-Invasive Test Results: No non-invasive testing performed  Prior CABG: Previous CABG      History and Physical Interval Note:  06/14/2021 2:36 PM  Donna Howe  has presented today for surgery, with the diagnosis of nstemi.  The various methods of treatment have been discussed with the patient and family. After consideration of risks, benefits and other options for treatment, the patient has consented to  Procedure(s): LEFT HEART CATH AND CORS/GRAFTS ANGIOGRAPHY (N/A) as a surgical intervention.  The patient's history has been reviewed, patient examined, no change in status, stable for surgery.  I have reviewed the patient's chart and labs.  Questions were answered to the patient's satisfaction.     Larae Grooms

## 2021-06-14 NOTE — Progress Notes (Signed)
ANTICOAGULATION CONSULT NOTE - Initial Consult  Pharmacy Consult for Heparin  Indication: chest pain/ACS  Allergies  Allergen Reactions   Penicillins Other (See Comments)    REACTION: Unknown, told as a child Has patient had a PCN reaction causing immediate rash, facial/tongue/throat swelling, SOB or lightheadedness with hypotension: Unknown Has patient had a PCN reaction causing severe rash involving mucus membranes or skin necrosis: Unknown Has patient had a PCN reaction that required hospitalization: Unknown Has patient had a PCN reaction occurring within the last 10 years: No If all of the above answers are "NO", then may proceed with Cephalosporin use.    Beta Adrenergic Blockers Other (See Comments)    Hypotension    Patient Measurements: Height: 5\' 6"  (167.6 cm) Weight: 99.8 kg (220 lb) IBW/kg (Calculated) : 59.3  Vital Signs: Temp: 98.1 F (36.7 C) (09/26 2123) BP: 136/60 (09/27 0100) Pulse Rate: 91 (09/27 0100)  Labs: Recent Labs    06/13/21 2143 06/13/21 2320  HGB 12.9  --   HCT 39.4  --   PLT 180  --   CREATININE 7.36*  --   TROPONINIHS 55* 523*    Estimated Creatinine Clearance: 9.4 mL/min (A) (by C-G formula based on SCr of 7.36 mg/dL (H)).   Medical History: Past Medical History:  Diagnosis Date   Anemia    Anxiety    Asthma    CAD (coronary artery disease)    Multivessel s/p CABG 2005, numerous PCIs since that time and documented graft disease   Carotid artery disease ([place])    R CEA   ESRD on hemodialysis ([place])    Essential hypertension    Gout    History of blood transfusion    Hyperlipidemia    Hypothyroidism    Myocardial infarction [facility])    PAD (peripheral artery disease) ([place])    Dr. Kellie Simmering   Pneumonia 09/2019, 11/2019   S/P angioplasty with stent- DES to mRCA and to LIMA to LAD with DES 04/09/18.   04/10/2018   SBO (small bowel obstruction) ([place]) 2011   Status post lysis of adhesions & hernia repair   Sinus bradycardia    Type 2  diabetes mellitus ([place])    Umbilical hernia      Assessment: 65 y/o F with significant cardiac history to start heparin drip for CP/elevated troponin. No anti-coagulation noted PTA. CBC good. ESRD on HD.   Goal of Therapy:  Heparin level 0.3-0.7 units/ml Monitor platelets by anticoagulation protocol: Yes   Plan:  Heparin 4000 units BOLUS Start heparin drip at 900 units/hr 1000 Heparin level Daily CBC/Heparin level Monitor for bleeding  Narda Bonds, PharmD, BCPS Clinical Pharmacist Phone: [phone]

## 2021-06-15 ENCOUNTER — Encounter (HOSPITAL_COMMUNITY): Payer: Medicare HMO

## 2021-06-15 ENCOUNTER — Encounter (HOSPITAL_COMMUNITY): Payer: Self-pay | Admitting: Interventional Cardiology

## 2021-06-15 ENCOUNTER — Inpatient Hospital Stay (HOSPITAL_COMMUNITY): Payer: Medicare HMO

## 2021-06-15 ENCOUNTER — Encounter: Payer: Medicare HMO | Admitting: Vascular Surgery

## 2021-06-15 DIAGNOSIS — R079 Chest pain, unspecified: Secondary | ICD-10-CM

## 2021-06-15 DIAGNOSIS — I214 Non-ST elevation (NSTEMI) myocardial infarction: Secondary | ICD-10-CM | POA: Diagnosis not present

## 2021-06-15 DIAGNOSIS — E782 Mixed hyperlipidemia: Secondary | ICD-10-CM | POA: Diagnosis not present

## 2021-06-15 DIAGNOSIS — I25110 Atherosclerotic heart disease of native coronary artery with unstable angina pectoris: Secondary | ICD-10-CM | POA: Diagnosis not present

## 2021-06-15 DIAGNOSIS — Z951 Presence of aortocoronary bypass graft: Secondary | ICD-10-CM | POA: Diagnosis not present

## 2021-06-15 LAB — BASIC METABOLIC PANEL
Anion gap: 7 (ref 5–15)
BUN: 18 mg/dL (ref 8–23)
CO2: 26 mmol/L (ref 22–32)
Calcium: 8.2 mg/dL — ABNORMAL LOW (ref 8.9–10.3)
Chloride: 100 mmol/L (ref 98–111)
Creatinine, Ser: 3.66 mg/dL — ABNORMAL HIGH (ref 0.44–1.00)
GFR, Estimated: 13 mL/min — ABNORMAL LOW (ref >60)
Glucose, Bld: 161 mg/dL — ABNORMAL HIGH (ref 70–99)
Potassium: 3.6 mmol/L (ref 3.5–5.1)
Sodium: 133 mmol/L — ABNORMAL LOW (ref 135–145)

## 2021-06-15 LAB — CBC
HCT: 31.9 % — ABNORMAL LOW (ref 36.0–46.0)
Hemoglobin: 10.5 g/dL — ABNORMAL LOW (ref 12.0–15.0)
MCH: 34.2 pg — ABNORMAL HIGH (ref 26.0–34.0)
MCHC: 32.9 g/dL (ref 30.0–36.0)
MCV: 103.9 fL — ABNORMAL HIGH (ref 80.0–100.0)
Platelets: 146 10*3/uL — ABNORMAL LOW (ref 150–400)
RBC: 3.07 MIL/uL — ABNORMAL LOW (ref 3.87–5.11)
RDW: 16.1 % — ABNORMAL HIGH (ref 11.5–15.5)
WBC: 11.5 10*3/uL — ABNORMAL HIGH (ref 4.0–10.5)
nRBC: 0 % (ref 0.0–0.2)

## 2021-06-15 LAB — GLUCOSE, CAPILLARY
Glucose-Capillary: 178 mg/dL — ABNORMAL HIGH (ref 70–99)
Glucose-Capillary: 180 mg/dL — ABNORMAL HIGH (ref 70–99)
Glucose-Capillary: 193 mg/dL — ABNORMAL HIGH (ref 70–99)
Glucose-Capillary: 171 mg/dL — ABNORMAL HIGH (ref 70–99)

## 2021-06-15 LAB — ECHOCARDIOGRAM COMPLETE
Area-P 1/2: 2.49 cm2
Height: 66 in
S' Lateral: 3.2 cm
Weight: 3499.14 oz

## 2021-06-15 LAB — HEPATITIS B SURFACE ANTIGEN: Hepatitis B Surface Ag: NONREACTIVE

## 2021-06-15 LAB — HEPATITIS B SURFACE ANTIBODY,QUALITATIVE: Hep B S Ab: NONREACTIVE

## 2021-06-15 LAB — CREATININE, SERUM
Creatinine, Ser: 8.2 mg/dL — ABNORMAL HIGH (ref 0.44–1.00)
GFR, Estimated: 5 mL/min — ABNORMAL LOW (ref >60)

## 2021-06-15 LAB — HEPARIN LEVEL (UNFRACTIONATED): Heparin Unfractionated: <0.1 IU/mL — ABNORMAL LOW (ref 0.30–0.70)

## 2021-06-15 MED ORDER — PERFLUTREN LIPID MICROSPHERE
1.0000 mL | INTRAVENOUS | Status: AC | PRN
  Administered 2021-06-15: 2 mL via INTRAVENOUS
  Filled 2021-06-15: qty 10

## 2021-06-15 MED ORDER — MIDODRINE HCL 5 MG PO TABS
10.0000 mg | ORAL_TABLET | Freq: Two times a day (BID) | ORAL | Status: DC
  Administered 2021-06-15 – 2021-06-17 (×5): 10 mg via ORAL
  Filled 2021-06-15 (×5): qty 2

## 2021-06-15 NOTE — Progress Notes (Signed)
PROGRESS NOTE    Donna Howe  UJW:[id number] DOB: 07-Jul-1956 DOA: 06/13/2021 PCP: Curlene Labrum, MD    Brief Narrative:  Donna Howe was admitted to the hospital with the working diagnosis of unstable angina, NSTEMI.   65 yo female with the past medical history of CAD sp CABG (2005), ESRD on HD, T2DM and obesity who presented with chest pain. Intermittent chest pain for 2 days, worsening, precordial, pressure in nature, 10/10 in intensity, radiated to the left shoulder and associated with diaphoresis.  Patient has chronic stable angina, using nitroglycerin for pain control.  Over the last 2 weeks she had worsening angina symptoms consistent with unstable angina -She has an extensive history of coronary artery disease and stable angina difficult to control.  Blood pressure has been a limiting factor for medical therapy. She follows with Dr. Domenic Polite in the cardiology clinic.   In April 2022 patient had a non-STEMI, had 2 drug-eluting stents placed to the SVG-RCA. In June 2022 she had another ST elevation myocardial infarction, 75% stenosis along the SVG to PDA which was treated with balloon angioplasty and drug-eluting stent placement to 80% stenosis along the mid graft lesion.  She had severe in-stent restenosis of her subclavian stent compromising flow into the LIMA.  Patient had angioplasty and stenting by Dr. Carlis Abbott. High sensitive troponin 55-523-12,628 SARS COVID 19 negative.  Assessment & Plan:   NSTEMI/ CAD sp CABG and multiple PCIs -Treated with heparin drip, hypotension limits beta-blocker use -Cardiology consulted, underwent left heart cath yesterday, noted to have severe three-vessel CAD, culprit lesion was suspected to be severe in-stent restenosis and SVG to RCA, this was treated with balloon angioplasty yesterday -Recommended to continue aspirin Plavix daily -Ranexa daily -Monitor BP  Chronic hypotension -Blood pressure dropped to 60s at 4 AM today -Chronic  hypotension limiting fluid removal at HD -Continue midodrine, dose increased to 10 mg twice daily   ESRD on HD. T-TH-Sat -Nephrology following, fluid removal with HD as blood pressure tolerates, clinically appears slightly volume overloaded   T2DM/ dyslipidemia  -CBGs are stable, sliding scale insulin for now  Hypothyroid. Continue with levothyroxine.   Obesity class 2. Calculated BMI is 35,5    DVT prophylaxis: Heparin SQ Code Status:    full  Family Communication:   No family at the bedside     Status is: Inpatient  Remains inpatient appropriate because:Inpatient level of care appropriate due to severity of illness  Dispo: The patient is from: Home              Anticipated d/c is to: Home, likely tomorrow              Patient currently is not medically stable to d/c.   Difficult to place patient No  Consultants:  Cardiology and nephrology   Procedures Cardiac cath 06/14/21   Ost LM to Dist LM lesion is 95% stenosed-severe restenosis   Prox LAD to Mid LAD lesion is 100% stenosed.   Ost LAD to Prox LAD lesion is 100% stenosed.  LIMA to LAD is patent.  Ostial LIMA stent with 30% in-stent restenosis.   Ost Cx to Mid Cx lesion is 99% stenosed.  SVG to OM/LPDA is occluded.   1st Mrg-1 lesion is 99% stenosed.   1st Mrg-2 lesion is 100% stenosed.   Origin to Prox Graft lesion is 30% stenosed.   Prox RCA lesion is 100% stenosed.  Severe in-stent restenosis in the proximal graft.   Prox Graft-1 lesion is 90%  stenosed.   Scoring balloon angioplasty was performed using a 3.5 mm core flex balloon.  This was followed with angioplasty by [place] 4.0X12.   Post intervention, there is a 0% residual stenosis.   LIMA and is normal in caliber.   LV end diastolic pressure is mildly elevated.   There is no aortic valve stenosis.   Severe three-vessel coronary artery disease.  Culprit for this presentation was likely the severe in-stent restenosis in the SVG to RCA.  Known  in-stent restenosis of prior left main stent. Patent ostial LIMA stent with only 30% in-stent restenosis.       Subjective: -Blood pressure dropped to the 60s this morning, better now -Chest pain has improved, denies any dyspnea today  Objective: Vitals:   06/15/21 0241 06/15/21 0320 06/15/21 0330 06/15/21 0715  BP: (!) 118/52 (!) 64/39 (!) 81/45 103/77  Pulse: 95  65 73  Resp: 20 20 19 20   Temp: 98.3 F (36.8 C)     TempSrc: Oral     SpO2: 98%  (!) 71% 93%  Weight: 99.2 kg     Height:        Intake/Output Summary (Last 24 hours) at 06/15/2021 1207 Last data filed at 06/15/2021 0241 Gross per 24 hour  Intake 134.2 ml  Output 1300 ml  Net -1165.8 ml   Filed Weights   06/13/21 2119 06/14/21 2155 06/15/21 0241  Weight: 99.8 kg 100.5 kg 99.2 kg    Examination:   General: Pleasant obese female sitting up in bed, AAOx3, no distress HEENT: No JVD CVS: S1-S2, regular rate rhythm, systolic murmur Lungs: Few basilar rales otherwise clear Abdomen: Soft, nontender, bowel sounds present Extremities: Trace edema, RUE aVF Skin: No rash on exposed skin    Data Reviewed: I have personally reviewed following labs and imaging studies  CBC: Recent Labs  Lab 06/13/21 2143 06/14/21 0444 06/14/21 2100 06/15/21 0454  WBC 12.6* 12.3* 13.0* 11.5*  HGB 12.9 11.7* 10.9* 10.5*  HCT 39.4 37.1 34.2* 31.9*  MCV 104.8* 105.7* 105.6* 103.9*  PLT 180 156 163 166*   Basic Metabolic Panel: Recent Labs  Lab 06/13/21 2143 06/14/21 0444 06/14/21 2100 06/15/21 0454  NA 135 136  --  133*  K 3.7 4.3  --  3.6  CL 97* 98  --  100  CO2 22 25  --  26  GLUCOSE 341* 177*  --  161*  BUN 60* 63*  --  18  CREATININE 7.36* 7.63* 8.20* 3.66*  CALCIUM 9.5 9.2  --  8.2*  MG  --  2.2  --   --   PHOS  --  7.6*  --   --    GFR: Estimated Creatinine Clearance: 18.9 mL/min (A) (by C-G formula based on SCr of 3.66 mg/dL (H)). Liver Function Tests: Recent Labs  Lab 06/14/21 0444  AST 58*  ALT 17   ALKPHOS 75  BILITOT 0.3  PROT 6.7  ALBUMIN 3.2*   No results for input(s): LIPASE, AMYLASE in the last 168 hours. No results for input(s): AMMONIA in the last 168 hours. Coagulation Profile: Recent Labs  Lab 06/14/21 0444  INR 1.1   Cardiac Enzymes: No results for input(s): CKTOTAL, CKMB, CKMBINDEX, TROPONINI in the last 168 hours. BNP (last 3 results) No results for input(s): PROBNP in the last 8760 hours. HbA1C: Recent Labs    06/14/21 0444  HGBA1C 7.2*   CBG: Recent Labs  Lab 06/14/21 0814 06/14/21 1227 06/14/21 1818 06/15/21 0927 06/15/21  [place]* 178* 180*   Lipid Profile: No results for input(s): CHOL, HDL, LDLCALC, TRIG, CHOLHDL, LDLDIRECT in the last 72 hours. Thyroid Function Tests: No results for input(s): TSH, T4TOTAL, FREET4, T3FREE, THYROIDAB in the last 72 hours. Anemia Panel: Recent Labs    06/14/21 0444  VITAMINB12 305  FOLATE 51.3    Scheduled Meds:  aspirin  81 mg Oral Daily   atorvastatin  80 mg Oral QPM   clopidogrel  75 mg Oral Q breakfast   ezetimibe  10 mg Oral Daily   heparin  5,000 Units Subcutaneous Q8H   insulin aspart  0-5 Units Subcutaneous QHS   insulin aspart  0-6 Units Subcutaneous TID WC   levothyroxine  112 mcg Oral QAC breakfast   midodrine  10 mg Oral BID WC   sodium chloride flush  3 mL Intravenous Q12H   Continuous Infusions:  sodium chloride       LOS: 1 day        Domenic Polite, MD

## 2021-06-15 NOTE — Progress Notes (Signed)
Echocardiogram 2D Echocardiogram has been performed.  Oneal Deputy Trevante Tennell RDCS 06/15/2021, 2:26 PM

## 2021-06-15 NOTE — Progress Notes (Signed)
HOSPITAL MEDICINE OVERNIGHT EVENT NOTE    Notified by nursing that upon arriving on the unit from dialysis patient's blood pressure was as low as 64/39.  Patient denied any significant associated symptoms.  Patient denied weakness or lightheadedness.  Chart reviewed, patient has intermittently exhibited low blood pressures throughout the hospitalization up to this point.  Seeing as how patient just returned from dialysis, this was managed conservatively with close monitoring and blood pressures seem to be trending upward, last blood pressure being 92/60.  We will continue to monitor.  Vernelle Emerald  MD [facility]

## 2021-06-15 NOTE — Progress Notes (Signed)
Progress Note  Patient Name: Donna Howe Date of Encounter: 06/15/2021  [facility] Cardiologist: Rozann Lesches, MD   Subjective   Some chest pain when back for cath lab, no SOB.  Felt ok with hypotension last pm.    Inpatient Medications    Scheduled Meds:  aspirin  81 mg Oral Daily   atorvastatin  80 mg Oral QPM   clopidogrel  75 mg Oral Q breakfast   ezetimibe  10 mg Oral Daily   heparin  5,000 Units Subcutaneous Q8H   insulin aspart  0-5 Units Subcutaneous QHS   insulin aspart  0-6 Units Subcutaneous TID WC   levothyroxine  112 mcg Oral QAC breakfast   midodrine  5 mg Oral TID WC   sodium chloride flush  3 mL Intravenous Q12H   Continuous Infusions:  sodium chloride     PRN Meds: sodium chloride, acetaminophen, albuterol, ondansetron (ZOFRAN) IV, sodium chloride flush   Vital Signs    Vitals:   06/15/21 0241 06/15/21 0320 06/15/21 0330 06/15/21 0715  BP: (!) 118/52 (!) 64/39 (!) 81/45 103/77  Pulse: 95  65 73  Resp: 20 20 19 20   Temp: 98.3 F (36.8 C)     TempSrc: Oral     SpO2: 98%  (!) 71% 93%  Weight: 99.2 kg     Height:        Intake/Output Summary (Last 24 hours) at 06/15/2021 0818 Last data filed at 06/15/2021 0241 Gross per 24 hour  Intake 134.2 ml  Output 1300 ml  Net -1165.8 ml   Last 3 Weights 06/15/2021 06/14/2021 06/13/2021  Weight (lbs) 218 lb 11.1 oz 221 lb 9 oz 220 lb  Weight (kg) 99.2 kg 100.5 kg 99.791 kg      Telemetry    SR  - Personally Reviewed  ECG    SR and no acute ST changes. - Personally Reviewed  Physical Exam   GEN: No acute distress.   Neck: No JVD Cardiac: RRR, no murmurs, rubs, or gallops.  Respiratory: + wheezes to auscultation bilaterally. GI: Soft, nontender, non-distended  MS: No edema; No deformity. Neuro:  Nonfocal  Psych: Normal affect   Labs    High Sensitivity Troponin:   Recent Labs  Lab 05/28/21 1129 05/28/21 1320 06/13/21 2143 06/13/21 2320 06/14/21 0444  TROPONINIHS 18* 19*  55* 523* 12,628*     Chemistry Recent Labs  Lab 06/13/21 2143 06/14/21 0444 06/14/21 2100 06/15/21 0454  NA 135 136  --  133*  K 3.7 4.3  --  3.6  CL 97* 98  --  100  CO2 22 25  --  26  GLUCOSE 341* 177*  --  161*  BUN 60* 63*  --  18  CREATININE 7.36* 7.63* 8.20* 3.66*  CALCIUM 9.5 9.2  --  8.2*  MG  --  2.2  --   --   PROT  --  6.7  --   --   ALBUMIN  --  3.2*  --   --   AST  --  58*  --   --   ALT  --  17  --   --   ALKPHOS  --  75  --   --   BILITOT  --  0.3  --   --   GFRNONAA 6* 6* 5* 13*  ANIONGAP 16* 13  --  7    Lipids No results for input(s): CHOL, TRIG, HDL, LABVLDL, LDLCALC, CHOLHDL in the last 168  hours.  Hematology Recent Labs  Lab 06/14/21 0444 06/14/21 2100 06/15/21 0454  WBC 12.3* 13.0* 11.5*  RBC 3.51* 3.24* 3.07*  HGB 11.7* 10.9* 10.5*  HCT 37.1 34.2* 31.9*  MCV 105.7* 105.6* 103.9*  MCH 33.3 33.6 34.2*  MCHC 31.5 31.9 32.9  RDW 16.2* 16.0* 16.1*  PLT 156 163 146*   Thyroid No results for input(s): TSH, FREET4 in the last 168 hours.  BNPNo results for input(s): BNP, PROBNP in the last 168 hours.  DDimer No results for input(s): DDIMER in the last 168 hours.   Radiology    DG Chest 2 View  Result Date: 06/13/2021 CLINICAL DATA:  Chest pain EXAM: CHEST - 2 VIEW COMPARISON:  05/28/2021 FINDINGS: The lungs are symmetrically well expanded. Trace interstitial infiltrate has developed diffusely most in keeping with mild interstitial pulmonary edema. No pneumothorax or pleural effusion. Right internal jugular hemodialysis catheter tip noted within the right atrium. Cardiac size within normal limits. Median sternotomy has been performed. No acute bone abnormality. IMPRESSION: Interval development of mild interstitial pulmonary edema. Electronically Signed   By: Fidela Salisbury M.D.   On: 06/13/2021 22:17   CARDIAC CATHETERIZATION  Result Date: 06/14/2021   Ost LM to Dist LM lesion is 95% stenosed-severe restenosis   Prox LAD to Mid LAD lesion is 100%  stenosed.   Ost LAD to Prox LAD lesion is 100% stenosed.  LIMA to LAD is patent.  Ostial LIMA stent with 30% in-stent restenosis.   Ost Cx to Mid Cx lesion is 99% stenosed.  SVG to OM/LPDA is occluded.   1st Mrg-1 lesion is 99% stenosed.   1st Mrg-2 lesion is 100% stenosed.   Origin to Prox Graft lesion is 30% stenosed.   Prox RCA lesion is 100% stenosed.  Severe in-stent restenosis in the proximal graft.   Prox Graft-1 lesion is 90% stenosed.   Scoring balloon angioplasty was performed using a 3.5 mm core flex balloon.  This was followed with angioplasty by [place] 4.0X12.   Post intervention, there is a 0% residual stenosis.   LIMA and is normal in caliber.   LV end diastolic pressure is mildly elevated.   There is no aortic valve stenosis. Severe three-vessel coronary artery disease.  Culprit for this presentation was likely the severe in-stent restenosis in the SVG to RCA.  Known in-stent restenosis of prior left main stent. Patent ostial LIMA stent with only 30% in-stent restenosis.  Mild volume overload.  The patient is due for dialysis.    Cardiac Studies   Cardiac cath 06/14/21   Ost LM to Dist LM lesion is 95% stenosed-severe restenosis   Prox LAD to Mid LAD lesion is 100% stenosed.   Ost LAD to Prox LAD lesion is 100% stenosed.  LIMA to LAD is patent.  Ostial LIMA stent with 30% in-stent restenosis.   Ost Cx to Mid Cx lesion is 99% stenosed.  SVG to OM/LPDA is occluded.   1st Mrg-1 lesion is 99% stenosed.   1st Mrg-2 lesion is 100% stenosed.   Origin to Prox Graft lesion is 30% stenosed.   Prox RCA lesion is 100% stenosed.  Severe in-stent restenosis in the proximal graft.   Prox Graft-1 lesion is 90% stenosed.   Scoring balloon angioplasty was performed using a 3.5 mm core flex balloon.  This was followed with angioplasty by [place] 4.0X12.   Post intervention, there is a 0% residual stenosis.   LIMA and is normal in caliber.  LV end diastolic pressure is mildly  elevated.   There is no aortic valve stenosis.   Severe three-vessel coronary artery disease.  Culprit for this presentation was likely the severe in-stent restenosis in the SVG to RCA.  Known in-stent restenosis of prior left main stent. Patent ostial LIMA stent with only 30% in-stent restenosis.     Mild volume overload.  The patient is due for dialysis. Diagnostic Dominance: Co-dominant Intervention   Echo pending   Patient Profile     65 y.o. female  with  history of CAD (s/p CABG in 2005 with multiple PCI's since including PTCA to SVG-OM1 in 01/2004, PTCA/DES to LM and prox LCx in 03/2004 and DES to SVG-OM1, S/p PTCA/DES to SVG-OM in 01/2011, PTCA/DES to LM and LCx in 2015, cutting balloon angioplasty to prox Cx and DES to SVG-RCA in 05/2017, NSTEMI in 03/2018 with DES to mid-RCA and DES to LIMA-LAD. By 2021 with NSTEMI -medical management due to high risk for re-do CABG and poor target vessels, s/p NSTEMI in 12/2020 with DESx2 to SVG-RCA), HTN, DM-2, HDL, ESRD on HD and hypotension, carotid and Lt subclavian disease.  Now admitted with NSTEMI.    Assessment & Plan    NSTEMI/significant CADwith CABG and graft disease/chronic angina --post PTCA 06/14/21 to VG to RCA  (hs troponin 06,004) --hypotension makes treatment difficult on Ranexa to prevent angina and does not lower BP. Aware not traditionally prescribed with renal pts -continue asa and plavix--she only takes ranexa once per day. -? Add jardiance for diabetes with CAD --awaiting echo.  Hypotension difficult to pull off fluid with low BP, midodrine had been added as outpt.  --post dialysis yesterday BP 64/93   HLD on lipitor and zetia  in June, LDL 76, ? Lipid clinic for further management.   ESRD on HD- has been difficult to remove volume due to hypotension. Midodrine was added with hope to add BB in future  DM-2/hypothyroid/obesity per IM           For questions or updates, please contact [place] Please  consult [email or web address] for contact info under        Signed, Cecilie Kicks, NP  06/15/2021, 8:18 AM

## 2021-06-15 NOTE — Progress Notes (Signed)
[facility] Progress Note   Subjective:  Seen in room - s/p LHC yesterday with re-stenosis of RCA graft s/p PTA. No bleeding issues from groin. No CP or dyspnea today. Dialyzed overnight - BP low this morning - a little better now. Cardiology has made a few medication changes.  Objective Vitals:   06/15/21 0241 06/15/21 0320 06/15/21 0330 06/15/21 0715  BP: (!) 118/52 (!) 64/39 (!) 81/45 103/77  Pulse: 95  65 73  Resp: 20 20 19 20   Temp: 98.3 F (36.8 C)     TempSrc: Oral     SpO2: 98%  (!) 71% 93%  Weight: 99.2 kg     Height:       Physical Exam General: Well appearing woman, NAD Heart: RRR; 2/6 murmur Lungs: CTAB Abdomen: soft, mildly distended Extremities: 1+ RLE edema, no LLE edema Dialysis Access: TDC + new RUE AVF (maturing)  Additional Objective Labs: Basic Metabolic Panel: Recent Labs  Lab 06/13/21 2143 06/14/21 0444 06/14/21 2100 06/15/21 0454  NA 135 136  --  133*  K 3.7 4.3  --  3.6  CL 97* 98  --  100  CO2 22 25  --  26  GLUCOSE 341* 177*  --  161*  BUN 60* 63*  --  18  CREATININE 7.36* 7.63* 8.20* 3.66*  CALCIUM 9.5 9.2  --  8.2*  PHOS  --  7.6*  --   --    Liver Function Tests: Recent Labs  Lab 06/14/21 0444  AST 58*  ALT 17  ALKPHOS 75  BILITOT 0.3  PROT 6.7  ALBUMIN 3.2*   CBC: Recent Labs  Lab 06/13/21 2143 06/14/21 0444 06/14/21 2100 06/15/21 0454  WBC 12.6* 12.3* 13.0* 11.5*  HGB 12.9 11.7* 10.9* 10.5*  HCT 39.4 37.1 34.2* 31.9*  MCV 104.8* 105.7* 105.6* 103.9*  PLT 180 156 163 146*   Studies/Results: DG Chest 2 View  Result Date: 06/13/2021 CLINICAL DATA:  Chest pain EXAM: CHEST - 2 VIEW COMPARISON:  05/28/2021 FINDINGS: The lungs are symmetrically well expanded. Trace interstitial infiltrate has developed diffusely most in keeping with mild interstitial pulmonary edema. No pneumothorax or pleural effusion. Right internal jugular hemodialysis catheter tip noted within the right atrium. Cardiac size within  normal limits. Median sternotomy has been performed. No acute bone abnormality. IMPRESSION: Interval development of mild interstitial pulmonary edema. Electronically Signed   By: Fidela Salisbury M.D.   On: 06/13/2021 22:17   CARDIAC CATHETERIZATION  Result Date: 06/14/2021   Ost LM to Dist LM lesion is 95% stenosed-severe restenosis   Prox LAD to Mid LAD lesion is 100% stenosed.   Ost LAD to Prox LAD lesion is 100% stenosed.  LIMA to LAD is patent.  Ostial LIMA stent with 30% in-stent restenosis.   Ost Cx to Mid Cx lesion is 99% stenosed.  SVG to OM/LPDA is occluded.   1st Mrg-1 lesion is 99% stenosed.   1st Mrg-2 lesion is 100% stenosed.   Origin to Prox Graft lesion is 30% stenosed.   Prox RCA lesion is 100% stenosed.  Severe in-stent restenosis in the proximal graft.   Prox Graft-1 lesion is 90% stenosed.   Scoring balloon angioplasty was performed using a 3.5 mm core flex balloon.  This was followed with angioplasty by [place] 4.0X12.   Post intervention, there is a 0% residual stenosis.   LIMA and is normal in caliber.   LV end diastolic pressure is mildly elevated.   There is no  aortic valve stenosis. Severe three-vessel coronary artery disease.  Culprit for this presentation was likely the severe in-stent restenosis in the SVG to RCA.  Known in-stent restenosis of prior left main stent. Patent ostial LIMA stent with only 30% in-stent restenosis.  Mild volume overload.  The patient is due for dialysis.   Medications:  sodium chloride      aspirin  81 mg Oral Daily   atorvastatin  80 mg Oral QPM   clopidogrel  75 mg Oral Q breakfast   ezetimibe  10 mg Oral Daily   heparin  5,000 Units Subcutaneous Q8H   insulin aspart  0-5 Units Subcutaneous QHS   insulin aspart  0-6 Units Subcutaneous TID WC   levothyroxine  112 mcg Oral QAC breakfast   midodrine  10 mg Oral BID WC   sodium chloride flush  3 mL Intravenous Q12H    Dialysis Orders: TTS at [facility] 4:15hr, 400/500, EDW  95kg, 2K/2.5Ca, TDC, heparin 2000 bolus - venofer 100 x 10 -- nearly complete - Mircera 74mcg Iv q 4 weeks - last 9/13 - Calcitriol 94mcg PO q HD   Assessment/Plan:  NSTEMI/severe CAD: Trop > 12K. Cards consulted and underwent LHC with balloon angioplasty to RCA graft.   ESRD: Usual TTS schedule - dialyzed overnight last night. Next tomorrow if still inpatient.  Hypertension/volume: BP low - says always happens with HD. She has some RLE edema and says stomach "feels full like I have extra fluid." CXR with mild pulm edema. Will continue to gradually lower EDW - can be done as outpatient.  Anemia: Hgb 10.5 - no ESA needed for now.  Metabolic bone disease: Ca ok, Phos ^ - resume binders.  Veneta Penton, PA-C 06/15/2021, 10:43 AM  [facility]

## 2021-06-15 NOTE — Progress Notes (Signed)
Patient transported off the floor via to the dialysis unit.  Patient was transported via stretcher by the transport team.     Donah Driver, RN

## 2021-06-15 NOTE — Progress Notes (Signed)
CARDIAC REHAB PHASE I   PRE:  Rate/Rhythm: 80 SR    BP: lying 92/47 left leg    SaO2: 79-88 RA asleep  MODE:  Ambulation: 220 ft   POST:  Rate/Rhythm: 105 ST    BP: left leg 77/38, 5 min later 89/57, left arm 71/34    SaO2: initially 78 RA, up to 99 RA   Pt asleep and SaO2 low. Up to 90 RA awake. Groin sore. Some dizziness but able to walk with contact guard. On return to room c/o CP to left arm and back, 5/10. BP in left leg 77/38. Applied 2L O2, angina resolved after 10 min. Pt sts she normally takes NTG for the "can't stand it" CP at home. Encouraged trying to relax. She does not have a BP cuff but BP always registers low.   Reviewed Plavix and restrictions. Encouraged activity in house and slowly increasing. Today's 200 ft seemed too much for her. Will place CRPII however pt is uninterested.  [id number]  [place], ACSM 06/15/2021 10:22 AM

## 2021-06-15 NOTE — Progress Notes (Signed)
Pt's chart reviewed this afternoon and possible d/c tomorrow noted. Contacted [facility] who states pt can receive treatment tomorrow at clinic as long as pt arrives by 11:15 am. Spoke to pt via phone to make her aware of this info. Pt states she will need to arrange transportation at d/c with family (aunt or son). Update provided to renal PA and secure chat sent to attending with this info.   Out-pt HD schedule: [facility] TTS- arrive by 11:15 with chair time of 11:30.  Melven Sartorius Renal Navigator [phone]

## 2021-06-15 NOTE — TOC Initial Note (Signed)
Transition of Care [facility]) - Initial/Assessment Note    Patient Details  Name: Donna Howe MRN: [id number] Date of Birth: 09/15/1956  [facility] CM/SW Contact:    Bethena Roys, RN Phone Number: 06/15/2021, 10:24 AM  Clinical Narrative:  Risk for readmission assessment completed. Case Manager spoke with patient and she states she is independent from home alone. Case Manager discussed home health services and patient feels she can manage at home. Case Manager will continue to monitor for transitional care needs as the patient progresses.               Expected Discharge Plan: Home/Self Care (Patient states she is idependent from home alone) Barriers to Discharge: Continued Medical Work up   Patient Goals and CMS Choice Patient states their goals for this hospitalization and ongoing recovery are:: to return home   Choice offered to / list presented to : NA  Expected Discharge Plan and Services Expected Discharge Plan: Home/Self Care (Patient states she is idependent from home alone) In-house Referral: NA Discharge Planning Services: CM Consult   Living arrangements for the past 2 months: Mobile Home                 DME Arranged: N/A DME Agency: NA     Prior Living Arrangements/Services Living arrangements for the past 2 months: Mobile Home Lives with:: Self Patient language and need for interpreter reviewed:: Yes Do you feel safe going back to the place where you live?: Yes      Need for Family Participation in Patient Care: No (Comment) Care giver support system in place?: No (comment)   Criminal Activity/Legal Involvement Pertinent to Current Situation/Hospitalization: No - Comment as needed  Activities of Daily Living      Permission Sought/Granted                  Emotional Assessment Appearance:: Appears stated age Attitude/Demeanor/Rapport: Engaged Affect (typically observed): Appropriate Orientation: : Oriented to  Situation, Oriented to  Time, Oriented to Place, Oriented to Self Alcohol / Substance Use: Not Applicable Psych Involvement: No (comment)  Admission diagnosis:  NSTEMI (non-ST elevated myocardial infarction) ([place]) [I21.4] Patient Active Problem List   Diagnosis Date Noted   Pulmonary edema 06/14/2021   Stenosis of left subclavian artery ([place])    Leukocytosis 01/09/2021   Elevated MCV 01/09/2021   Diabetic neuropathy ([place]) 01/09/2021   Unspecified protein-calorie malnutrition ([place]) 07/05/2020   Allergy, unspecified, initial encounter 06/30/2020   Anaphylactic shock, unspecified, initial encounter 06/30/2020   Anemia in chronic kidney disease 06/30/2020   Coagulation defect, unspecified ([place]) 06/30/2020   Diarrhea, unspecified 06/30/2020   Encounter for immunization 06/30/2020   Iron deficiency anemia, unspecified 06/30/2020   Pain, unspecified 06/30/2020   PAD (peripheral artery disease) ([place]) 06/30/2020   Pruritus, unspecified 06/30/2020   Secondary hyperparathyroidism of renal origin ([place]) 06/30/2020   ESRD on dialysis ([place])    SOB (shortness of breath)    Chronic diastolic HF (heart failure) (HCC)    GERD (gastroesophageal reflux disease)    Acute renal failure superimposed on stage 5 chronic kidney disease, not on chronic dialysis ([place]) 06/13/2020   Hyperglycemia due to diabetes mellitus ([place]) 06/13/2020   Pneumonia 12/17/2019   Cough variant asthma with component of UACS 11/20/2019   Elevated troponin I level 10/02/2019   Chest pain 10/01/2019   Unstable angina ([place]) 04/18/2018   Hypothyroidism (acquired) 04/18/2018   S/P angioplasty with stent- DES to mRCA and to LIMA to  LAD with DES 04/09/18.   04/10/2018   Angina pectoris ([place]) 04/05/2018   Status post coronary artery stent placement    Acute coronary syndrome ([place]) 05/31/2017   Acute chest pain    NSTEMI (non-ST elevated myocardial infarction) ([place])    History of coronary artery disease    Diabetes mellitus with ESRD  (end-stage renal disease) ([place]) 10/28/2013   Hypoglycemia associated with diabetes ([place]) 10/28/2013   Thrombocytopenia, unspecified ([place]) 10/28/2013   Hypokalemia 10/27/2013   Syncope 10/26/2013   Anemia 10/26/2013   AKI (acute kidney injury) ([place]) 10/26/2013   Fracture of toe of right foot 83/29/1916   Umbilical hernia    Carotid artery disease ([place])    Occlusion and stenosis of carotid artery without mention of cerebral infarction 04/15/2013   Hx of CABG    Ejection fraction    Atherosclerosis of native artery of extremity with intermittent claudication ([place]) 02/11/2013   Diabetes mellitus with renal manifestation ([place]) 01/21/2013   Bradycardia 01/03/2013   Chronic kidney disease (CKD), stage III (moderate) (HCC)    Gout    PROTEINURIA, MILD 01/18/2010   Obesity (BMI 30-39.9) 08/26/2009   Asthma 08/26/2009   Mixed hyperlipidemia 03/31/2007   Essential hypertension 03/31/2007   CAD (coronary artery disease) 03/31/2007   PCP:  Curlene Labrum, MD Pharmacy:   [facility] [address], [place] - [place] [address] [place] [address] Phone: [phone] Fax: [phone]  [facility] - [place], [place] - [address] [place] [address] Phone: [phone] Fax: [phone]  [facility], [place] - [place] [address] Phone: [phone] Fax: [phone]  [facility] [address] [place] [address] Phone: [phone] Fax: [phone]     Social Determinants of Health (SDOH) Interventions    Readmission Risk Interventions Readmission Risk Prevention Plan 06/15/2021 02/25/2021 06/25/2020  Transportation Screening Complete Complete Complete  Medication Review Press photographer) - Complete Complete  PCP or Specialist appointment within 3-5 days of discharge Complete - Complete  HRI or  Home Care Consult Complete Complete Complete  SW Recovery Care/Counseling Consult Complete Complete Complete  Palliative Care Screening Not Applicable Not Applicable Not [place] Not Applicable Not Applicable Not Applicable  Some recent data might be hidden

## 2021-06-15 NOTE — Progress Notes (Signed)
Patient returned from dialysis and her blood pressure was 64/39.  Patient is asymptomatic at this time, no dizziness, shortness  of breath, or signs or respiratory distress. MD notified will continue to monitor.   Donah Driver, RN

## 2021-06-16 LAB — BASIC METABOLIC PANEL
Anion gap: 11 (ref 5–15)
BUN: 36 mg/dL — ABNORMAL HIGH (ref 8–23)
CO2: 24 mmol/L (ref 22–32)
Calcium: 8.9 mg/dL (ref 8.9–10.3)
Chloride: 100 mmol/L (ref 98–111)
Creatinine, Ser: 5.4 mg/dL — ABNORMAL HIGH (ref 0.44–1.00)
GFR, Estimated: 8 mL/min — ABNORMAL LOW (ref >60)
Glucose, Bld: 221 mg/dL — ABNORMAL HIGH (ref 70–99)
Potassium: 4.2 mmol/L (ref 3.5–5.1)
Sodium: 135 mmol/L (ref 135–145)

## 2021-06-16 LAB — CBC
HCT: 32.2 % — ABNORMAL LOW (ref 36.0–46.0)
Hemoglobin: 10.1 g/dL — ABNORMAL LOW (ref 12.0–15.0)
MCH: 33.1 pg (ref 26.0–34.0)
MCHC: 31.4 g/dL (ref 30.0–36.0)
MCV: 105.6 fL — ABNORMAL HIGH (ref 80.0–100.0)
Platelets: 161 10*3/uL (ref 150–400)
RBC: 3.05 MIL/uL — ABNORMAL LOW (ref 3.87–5.11)
RDW: 16.1 % — ABNORMAL HIGH (ref 11.5–15.5)
WBC: 8.6 10*3/uL (ref 4.0–10.5)
nRBC: 0 % (ref 0.0–0.2)

## 2021-06-16 LAB — GLUCOSE, CAPILLARY
Glucose-Capillary: 223 mg/dL — ABNORMAL HIGH (ref 70–99)
Glucose-Capillary: 284 mg/dL — ABNORMAL HIGH (ref 70–99)
Glucose-Capillary: 160 mg/dL — ABNORMAL HIGH (ref 70–99)
Glucose-Capillary: 191 mg/dL — ABNORMAL HIGH (ref 70–99)

## 2021-06-16 LAB — HEPATITIS B SURFACE ANTIBODY, QUANTITATIVE: Hep B S AB Quant (Post): <3.1 m[IU]/mL — ABNORMAL LOW (ref >9.9)

## 2021-06-16 MED ORDER — ALPRAZOLAM 0.5 MG PO TABS
0.5000 mg | ORAL_TABLET | Freq: Every day | ORAL | Status: DC
  Filled 2021-06-16: qty 1

## 2021-06-16 MED ORDER — PANTOPRAZOLE SODIUM 40 MG PO TBEC
40.0000 mg | DELAYED_RELEASE_TABLET | Freq: Two times a day (BID) | ORAL | Status: DC
  Administered 2021-06-16 – 2021-06-17 (×3): 40 mg via ORAL
  Filled 2021-06-16 (×3): qty 1

## 2021-06-16 MED ORDER — IPRATROPIUM BROMIDE 0.02 % IN SOLN: 0.5000 mg | RESPIRATORY_TRACT | Status: DC | PRN

## 2021-06-16 MED ORDER — ALPRAZOLAM 0.25 MG PO TABS
0.2500 mg | ORAL_TABLET | Freq: Every day | ORAL | Status: DC
  Administered 2021-06-16 – 2021-06-17 (×2): 0.25 mg via ORAL
  Filled 2021-06-16: qty 1

## 2021-06-16 MED ORDER — CYCLOBENZAPRINE HCL 10 MG PO TABS
5.0000 mg | ORAL_TABLET | Freq: Three times a day (TID) | ORAL | Status: DC | PRN
  Administered 2021-06-16: 5 mg via ORAL
  Filled 2021-06-16: qty 1

## 2021-06-16 MED ORDER — ALLOPURINOL 100 MG PO TABS: 100.0000 mg | ORAL_TABLET | ORAL | Status: DC

## 2021-06-16 MED ORDER — POLYETHYLENE GLYCOL 3350 17 G PO PACK
17.0000 g | PACK | Freq: Every day | ORAL | Status: DC
  Filled 2021-06-16 (×2): qty 1

## 2021-06-16 MED ORDER — GABAPENTIN 400 MG PO CAPS
400.0000 mg | ORAL_CAPSULE | Freq: Two times a day (BID) | ORAL | Status: DC
  Administered 2021-06-16 – 2021-06-17 (×2): 400 mg via ORAL
  Filled 2021-06-16 (×2): qty 1

## 2021-06-16 MED ORDER — TRIMETHOBENZAMIDE HCL 300 MG PO CAPS
300.0000 mg | ORAL_CAPSULE | Freq: Three times a day (TID) | ORAL | Status: DC | PRN
  Administered 2021-06-16 – 2021-06-17 (×4): 300 mg via ORAL
  Filled 2021-06-16 (×6): qty 1

## 2021-06-16 MED ORDER — CHLORHEXIDINE GLUCONATE CLOTH 2 % EX PADS
6.0000 | MEDICATED_PAD | Freq: Every day | CUTANEOUS | Status: DC
  Administered 2021-06-16: 6 via TOPICAL

## 2021-06-16 MED ORDER — ALBUTEROL SULFATE (2.5 MG/3ML) 0.083% IN NEBU
2.5000 mg | INHALATION_SOLUTION | RESPIRATORY_TRACT | Status: DC | PRN
  Administered 2021-06-16: 2.5 mg via RESPIRATORY_TRACT
  Filled 2021-06-16: qty 3

## 2021-06-16 MED ORDER — IPRATROPIUM BROMIDE 0.02 % IN SOLN
0.5000 mg | RESPIRATORY_TRACT | Status: DC
  Administered 2021-06-16 (×2): 0.5 mg via RESPIRATORY_TRACT
  Filled 2021-06-16 (×3): qty 2.5

## 2021-06-16 MED ORDER — ALUM & MAG HYDROXIDE-SIMETH 200-200-20 MG/5ML PO SUSP
30.0000 mL | ORAL | Status: DC | PRN
  Administered 2021-06-16: 30 mL via ORAL
  Filled 2021-06-16: qty 30

## 2021-06-16 NOTE — Progress Notes (Signed)
CARDIAC REHAB PHASE I   PRE:  Rate/Rhythm: 70 SR    BP: sitting     SaO2: 92 RA  MODE:  Ambulation: 420 ft   POST:  Rate/Rhythm: 101 ST    BP: sitting 108/53 left arm     SaO2: 96 RA  Pt sts she doesn't feel as well today but tolerated ambulation better than yesterday. Increased distance, steadier, no CP afterward, BP stable left arm.  No questions, encouraged increased activity as tolerated. [place], ACSM 06/16/2021 9:36 AM

## 2021-06-16 NOTE — Progress Notes (Signed)
C/O nausea. Tigan 300 mg po given. No further needs expressed.

## 2021-06-16 NOTE — Progress Notes (Signed)
Donna Howe Progress Note   Subjective:  Seen in room - significant nausea this morning, will be staying another day. Denies CP/dyspnea currently.  Objective Vitals:   06/15/21 1457 06/15/21 2007 06/16/21 0400 06/16/21 0725  BP: (!) 85/74 (!) 69/49 (!) 105/40 (!) 98/50  Pulse: 77 76 78 86  Resp: 19 16 15 15   Temp: 99.4 F (37.4 C) 99.4 F (37.4 C) 98.5 F (36.9 C)   TempSrc: Oral Oral Oral   SpO2: 94% 100% 90%   Weight:      Height:       Physical Exam General: Well appearing woman, NAD Heart: RRR; 2/6 murmur Lungs: CTAB Abdomen: soft, mildly distended Extremities: 1+ RLE edema, no LLE edema Dialysis Access: TDC + new RUE AVF (maturing)    Additional Objective Labs: Basic Metabolic Panel: Recent Labs  Lab 06/14/21 0444 06/14/21 2100 06/15/21 0454 06/16/21 0243  NA 136  --  133* 135  K 4.3  --  3.6 4.2  CL 98  --  100 100  CO2 25  --  26 24  GLUCOSE 177*  --  161* 221*  BUN 63*  --  18 36*  CREATININE 7.63* 8.20* 3.66* 5.40*  CALCIUM 9.2  --  8.2* 8.9  PHOS 7.6*  --   --   --    Liver Function Tests: Recent Labs  Lab 06/14/21 0444  AST 58*  ALT 17  ALKPHOS 75  BILITOT 0.3  PROT 6.7  ALBUMIN 3.2*   CBC: Recent Labs  Lab 06/13/21 2143 06/14/21 0444 06/14/21 2100 06/15/21 0454 06/16/21 0243  WBC 12.6* 12.3* 13.0* 11.5* 8.6  HGB 12.9 11.7* 10.9* 10.5* 10.1*  HCT 39.4 37.1 34.2* 31.9* 32.2*  MCV 104.8* 105.7* 105.6* 103.9* 105.6*  PLT 180 156 163 146* 161   Studies/Results: CARDIAC CATHETERIZATION  Result Date: 06/14/2021   Ost LM to Dist LM lesion is 95% stenosed-severe restenosis   Prox LAD to Mid LAD lesion is 100% stenosed.   Ost LAD to Prox LAD lesion is 100% stenosed.  LIMA to LAD is patent.  Ostial LIMA stent with 30% in-stent restenosis.   Ost Cx to Mid Cx lesion is 99% stenosed.  SVG to OM/LPDA is occluded.   1st Mrg-1 lesion is 99% stenosed.   1st Mrg-2 lesion is 100% stenosed.   Origin to Prox Graft lesion is 30%  stenosed.   Prox RCA lesion is 100% stenosed.  Severe in-stent restenosis in the proximal graft.   Prox Graft-1 lesion is 90% stenosed.   Scoring balloon angioplasty was performed using a 3.5 mm core flex balloon.  This was followed with angioplasty by [facility] [place] [id number].   Post intervention, there is a 0% residual stenosis.   LIMA and is normal in caliber.   LV end diastolic pressure is mildly elevated.   There is no aortic valve stenosis. Severe three-vessel coronary artery disease.  Culprit for this presentation was likely the severe in-stent restenosis in the SVG to RCA.  Known in-stent restenosis of prior left main stent. Patent ostial LIMA stent with only 30% in-stent restenosis.  Mild volume overload.  The patient is due for dialysis.   ECHOCARDIOGRAM COMPLETE  Result Date: 06/15/2021    ECHOCARDIOGRAM REPORT   Patient Name:   Donna Howe Date of Exam: 06/15/2021 Medical Rec #:  [id number]        Height:       66.0 in Accession #:    [id number]  Weight:       218.7 lb Date of Birth:  03/17/1956       BSA:          2.077 m Patient Age:    65 years         BP:           98/27 mmHg Patient Gender: F                HR:           136 bpm. Exam Location:  Inpatient Procedure: 2D Echo, Color Doppler, Cardiac Doppler and Intracardiac            Opacification Agent Indications:    R07.9* Chest pain, unspecified  History:        Patient has prior history of Echocardiogram examinations, most                 recent 06/19/2020. CAD; Risk Factors:Hypertension and                 Dyslipidemia.  Sonographer:    Raquel Sarna Senior RDCS Referring Phys: [place]  1. Poor endocardial visualization even with contrast Appears to be some inferior basal wall hypokineis on contrast images . Left ventricular ejection fraction, by estimation, is 50 to 55%. The left ventricle has low normal function. The left ventricle demonstrates regional wall motion abnormalities (see scoring diagram/findings  for description). There is moderate left ventricular hypertrophy. Left ventricular diastolic parameters are consistent with Grade II diastolic dysfunction (pseudonormalization). Elevated left ventricular end-diastolic pressure.  2. Right ventricular systolic function is normal. The right ventricular size is normal.  3. Left atrial size was mildly dilated.  4. The mitral valve is abnormal. Trivial mitral valve regurgitation. No evidence of mitral stenosis.  5. The aortic valve is tricuspid. Aortic valve regurgitation is not visualized. Mild to moderate aortic valve sclerosis/calcification is present, without any evidence of aortic stenosis.  6. The inferior vena cava is normal in size with greater than 50% respiratory variability, suggesting right atrial pressure of 3 mmHg. FINDINGS  Left Ventricle: Poor endocardial visualization even with contrast Appears to be some inferior basal wall hypokineis on contrast images. Left ventricular ejection fraction, by estimation, is 50 to 55%. The left ventricle has low normal function. The left  ventricle demonstrates regional wall motion abnormalities. Definity contrast agent was given IV to delineate the left ventricular endocardial borders. The left ventricular internal cavity size was normal in size. There is moderate left ventricular hypertrophy. Left ventricular diastolic parameters are consistent with Grade II diastolic dysfunction (pseudonormalization). Elevated left ventricular end-diastolic pressure. Right Ventricle: The right ventricular size is normal. No increase in right ventricular wall thickness. Right ventricular systolic function is normal. Left Atrium: Left atrial size was mildly dilated. Right Atrium: Right atrial size was normal in size. Pericardium: There is no evidence of pericardial effusion. Mitral Valve: The mitral valve is abnormal. There is mild thickening of the mitral valve leaflet(s). There is mild calcification of the mitral valve leaflet(s).  Trivial mitral valve regurgitation. No evidence of mitral valve stenosis. Tricuspid Valve: The tricuspid valve is normal in structure. Tricuspid valve regurgitation is not demonstrated. No evidence of tricuspid stenosis. Aortic Valve: The aortic valve is tricuspid. Aortic valve regurgitation is not visualized. Mild to moderate aortic valve sclerosis/calcification is present, without any evidence of aortic stenosis. Pulmonic Valve: The pulmonic valve was normal in structure. Pulmonic valve regurgitation is not visualized. No evidence of pulmonic stenosis.  Aorta: The aortic root is normal in size and structure. Venous: The inferior vena cava is normal in size with greater than 50% respiratory variability, suggesting right atrial pressure of 3 mmHg. IAS/Shunts: No atrial level shunt detected by color flow Doppler.  LEFT VENTRICLE PLAX 2D LVIDd:         4.30 cm  Diastology LVIDs:         3.20 cm  LV e' medial:    4.46 cm/s LV PW:         1.60 cm  LV E/e' medial:  27.6 LV IVS:        1.60 cm  LV e' lateral:   4.13 cm/s LVOT diam:     1.80 cm  LV E/e' lateral: 29.8 LV SV:         65 LV SV Index:   31 LVOT Area:     2.54 cm  RIGHT VENTRICLE RV S prime:     8.16 cm/s TAPSE (M-mode): 1.0 cm LEFT ATRIUM             Index       RIGHT ATRIUM           Index LA diam:        4.40 cm 2.12 cm/m  RA Area:     15.90 cm LA Vol (A2C):   59.9 ml 28.84 ml/m RA Volume:   42.00 ml  20.22 ml/m LA Vol (A4C):   62.9 ml 30.28 ml/m LA Biplane Vol: 62.1 ml 29.90 ml/m  AORTIC VALVE LVOT Vmax:   135.00 cm/s LVOT Vmean:  91.900 cm/s LVOT VTI:    0.257 m  AORTA Ao Asc diam: 2.80 cm MITRAL VALVE MV Area (PHT): 2.49 cm     SHUNTS MV Decel Time: 305 msec     Systemic VTI:  0.26 m MV E velocity: 123.00 cm/s  Systemic Diam: 1.80 cm MV A velocity: 145.00 cm/s MV E/A ratio:  0.85 Donna Rouge MD Electronically signed by Donna Rouge MD Signature Date/Time: 06/15/2021/3:03:19 PM    Final    Medications:  sodium chloride      aspirin  81 mg Oral  Daily   atorvastatin  80 mg Oral QPM   Chlorhexidine Gluconate Cloth  6 each Topical Daily   clopidogrel  75 mg Oral Q breakfast   ezetimibe  10 mg Oral Daily   heparin  5,000 Units Subcutaneous Q8H   insulin aspart  0-5 Units Subcutaneous QHS   insulin aspart  0-6 Units Subcutaneous TID WC   ipratropium  0.5 mg Inhalation Q4H   levothyroxine  112 mcg Oral QAC breakfast   midodrine  10 mg Oral BID WC   polyethylene glycol  17 g Oral Daily   sodium chloride flush  3 mL Intravenous Q12H    Dialysis Orders: TTS at [facility] 4:15hr, 400/500, EDW 95kg, 2K/2.5Ca, TDC, heparin 2000 bolus - venofer 100 x 10 -- nearly complete - Mircera 62mcg Iv q 4 weeks - last 9/13 - Calcitriol 28mcg PO q HD   Assessment/Plan:  NSTEMI/severe CAD: Trop > 12K. Cards consulted and underwent LHC with balloon angioplasty to RCA graft. On Plavix/ASA.  ESRD: Usual TTS schedule - for HD today.  Hypotension/volume: BP low - says always happens with HD, on mido 10mg  TID. She has some RLE edema and says stomach "feels full like I have extra fluid." CXR with mild pulm edema. Will continue to gradually lower EDW - can be done as outpatient.  Anemia: Hgb  10.1 - no ESA needed for now.  Metabolic bone disease: Ca ok, Phos ^ - resume home binders.    Veneta Penton, PA-C 06/16/2021, 9:14 AM  [employer]

## 2021-06-16 NOTE — Progress Notes (Addendum)
PROGRESS NOTE    Donna Howe  BDZ:[id number] DOB: 06-25-1956 DOA: 06/13/2021 PCP: Curlene Labrum, MD    Brief Narrative:  Donna Howe was admitted to the hospital with the working diagnosis of unstable angina, NSTEMI.   65 yo female with the past medical history of CAD sp CABG (2005), ESRD on HD, T2DM and obesity who presented with chest pain. Intermittent chest pain for 2 days, worsening, precordial, pressure in nature, 10/10 in intensity, radiated to the left shoulder and associated with diaphoresis.  Patient has chronic stable angina, using nitroglycerin for pain control.  Over the last 2 weeks she had worsening angina symptoms consistent with unstable angina -She has an extensive history of coronary artery disease and stable angina difficult to control.  Blood pressure has been a limiting factor for medical therapy. She follows with Donna Howe in the cardiology clinic.   In April 2022 patient had a non-STEMI, had 2 drug-eluting stents placed to the SVG-RCA. In June 2022 she had another ST elevation myocardial infarction, 75% stenosis along the SVG to PDA which was treated with balloon angioplasty and drug-eluting stent placement to 80% stenosis along the mid graft lesion.  She had severe in-stent restenosis of her subclavian stent compromising flow into the LIMA.  Patient had angioplasty and stenting by Donna Howe. High sensitive troponin [phone] SARS COVID 19 negative.  Subjective: Does not feel well today, stomach is hurting, increased reflux, vomited overnight, also complains of shortness of breath  Assessment & Plan:   NSTEMI/ CAD sp CABG and multiple PCIs -Treated with heparin drip, hypotension limits beta-blocker use -Cardiology consulted, underwent left heart cath yesterday, noted to have severe three-vessel CAD, culprit lesion was suspected to be severe in-stent restenosis and SVG to RCA, this was treated with balloon angioplasty 9/27 -Recommended to continue  aspirin Plavix daily -Ranexa daily recommended  Chronic hypotension -Blood pressure dropped to 60s yesterday -Chronic hypotension limiting fluid removal at HD -Continue midodrine, dose increased to 10 mg twice daily -For HD today  Abdominal discomfort, GERD, constipation -Resume PPI, trial of laxatives -Supportive care today   ESRD on HD. T-TH-Sat -Nephrology following, fluid removal with HD as blood pressure tolerates, clinically appears slightly volume overloaded -Dialysis today   T2DM/ dyslipidemia  -CBGs are stable, sliding scale insulin for now  Hypothyroid. Continue with levothyroxine.   Obesity class 2. Calculated BMI is 35,5    DVT prophylaxis: Heparin SQ Code Status:    full  Family Communication:   No family at the bedside     Status is: Inpatient  Remains inpatient appropriate because:Inpatient level of care appropriate due to severity of illness  Dispo: The patient is from: Home              Anticipated d/c is to: Home, likely tomorrow              Patient currently is not medically stable to d/c.   Difficult to place patient No  Consultants:  Cardiology and nephrology   Procedures Cardiac cath 06/14/21   Ost LM to Dist LM lesion is 95% stenosed-severe restenosis   Prox LAD to Mid LAD lesion is 100% stenosed.   Ost LAD to Prox LAD lesion is 100% stenosed.  LIMA to LAD is patent.  Ostial LIMA stent with 30% in-stent restenosis.   Ost Cx to Mid Cx lesion is 99% stenosed.  SVG to OM/LPDA is occluded.   1st Mrg-1 lesion is 99% stenosed.   1st Mrg-2 lesion is 100%  stenosed.   Origin to Prox Graft lesion is 30% stenosed.   Prox RCA lesion is 100% stenosed.  Severe in-stent restenosis in the proximal graft.   Prox Graft-1 lesion is 90% stenosed.   Scoring balloon angioplasty was performed using a 3.5 mm core flex balloon.  This was followed with angioplasty by Donna Howe 4.0X12.   Post intervention, there is a 0% residual stenosis.   LIMA and is  normal in caliber.   LV end diastolic pressure is mildly elevated.   There is no aortic valve stenosis.   Severe three-vessel coronary artery disease.  Culprit for this presentation was likely the severe in-stent restenosis in the SVG to RCA.  Known in-stent restenosis of prior left main stent. Patent ostial LIMA stent with only 30% in-stent restenosis.       Subjective: -Blood pressure dropped to the 60s this morning, better now -Chest pain has improved, denies any dyspnea today  Objective: Vitals:   06/15/21 1457 06/15/21 2007 06/16/21 0400 06/16/21 0725  BP: (!) 85/74 (!) 69/49 (!) 105/40 (!) 98/50  Pulse: 77 76 78 86  Resp: 19 16 15 15   Temp: 99.4 F (37.4 C) 99.4 F (37.4 C) 98.5 F (36.9 C)   TempSrc: Oral Oral Oral   SpO2: 94% 100% 90%   Weight:      Height:       No intake or output data in the 24 hours ending 06/16/21 1304  Filed Weights   06/13/21 2119 06/14/21 2155 06/15/21 0241  Weight: 99.8 kg 100.5 kg 99.2 kg    Examination:   General: Pleasant obese female sitting up in bed, AAOx3, no distress HEENT: No JVD CVS: S1-S2, regular rate rhythm, systolic murmur Lungs: Few basilar rales otherwise clear Abdomen: Soft, nontender, bowel sounds present Extremities: Trace edema, RUE aVF Skin: No rash on exposed skin    Data Reviewed: I have personally reviewed following labs and imaging studies  CBC: Recent Labs  Lab 06/13/21 2143 06/14/21 0444 06/14/21 2100 06/15/21 0454 06/16/21 0243  WBC 12.6* 12.3* 13.0* 11.5* 8.6  HGB 12.9 11.7* 10.9* 10.5* 10.1*  HCT 39.4 37.1 34.2* 31.9* 32.2*  MCV 104.8* 105.7* 105.6* 103.9* 105.6*  PLT 180 156 163 146* 458   Basic Metabolic Panel: Recent Labs  Lab 06/13/21 2143 06/14/21 0444 06/14/21 2100 06/15/21 0454 06/16/21 0243  NA 135 136  --  133* 135  K 3.7 4.3  --  3.6 4.2  CL 97* 98  --  100 100  CO2 22 25  --  26 24  GLUCOSE 341* 177*  --  161* 221*  BUN 60* 63*  --  18 36*  CREATININE 7.36* 7.63*  8.20* 3.66* 5.40*  CALCIUM 9.5 9.2  --  8.2* 8.9  MG  --  2.2  --   --   --   PHOS  --  7.6*  --   --   --    GFR: Estimated Creatinine Clearance: 12.8 mL/min (A) (by C-G formula based on SCr of 5.4 mg/dL (H)). Liver Function Tests: Recent Labs  Lab 06/14/21 0444  AST 58*  ALT 17  ALKPHOS 75  BILITOT 0.3  PROT 6.7  ALBUMIN 3.2*   No results for input(s): LIPASE, AMYLASE in the last 168 hours. No results for input(s): AMMONIA in the last 168 hours. Coagulation Profile: Recent Labs  Lab 06/14/21 0444  INR 1.1   Cardiac Enzymes: No results for input(s): CKTOTAL, CKMB, CKMBINDEX, TROPONINI in the last 168  hours. BNP (last 3 results) No results for input(s): PROBNP in the last 8760 hours. HbA1C: Recent Labs    06/14/21 0444  HGBA1C 7.2*   CBG: Recent Labs  Lab 06/15/21 1131 06/15/21 1645 06/15/21 2119 06/16/21 0626 06/16/21 1151  GLUCAP 180* 193* 171* 223* 284*   Lipid Profile: No results for input(s): CHOL, HDL, LDLCALC, TRIG, CHOLHDL, LDLDIRECT in the last 72 hours. Thyroid Function Tests: No results for input(s): TSH, T4TOTAL, FREET4, T3FREE, THYROIDAB in the last 72 hours. Anemia Panel: Recent Labs    06/14/21 0444  VITAMINB12 305  FOLATE 51.3    Scheduled Meds:  aspirin  81 mg Oral Daily   atorvastatin  80 mg Oral QPM   Chlorhexidine Gluconate Cloth  6 each Topical Daily   clopidogrel  75 mg Oral Q breakfast   ezetimibe  10 mg Oral Daily   heparin  5,000 Units Subcutaneous Q8H   insulin aspart  0-5 Units Subcutaneous QHS   insulin aspart  0-6 Units Subcutaneous TID WC   levothyroxine  112 mcg Oral QAC breakfast   midodrine  10 mg Oral BID WC   pantoprazole  40 mg Oral BID   polyethylene glycol  17 g Oral Daily   sodium chloride flush  3 mL Intravenous Q12H   Continuous Infusions:  sodium chloride       LOS: 2 days        Domenic Polite, MD

## 2021-06-16 NOTE — Progress Notes (Deleted)
Cardiology Office Note  Date: 06/16/2021   ID: Donna Howe, DOB December 26, 1955, MRN [id number]  PCP:  Curlene Labrum, MD  Cardiologist:  Rozann Lesches, MD Electrophysiologist:  None   Chief Complaint: Hospital follow-up  History of Present Illness: Donna Howe is a 65 y.o. female with a history of CAD, carotid artery disease, ESRD, HTN, HLD, hypothyroidism, PAD, GERD, DM2, anemia  Recent presentation to [facility] emergency department on 06/13/2021 with complaint of chest discomfort.  This had been occurring for the prior 2 days and became worse on that evening.  She stated she felt as though there was something sitting on her chest and radiating to her left arm.  She denied any radiation but did feel somewhat short of breath.  She stated this was similar to what she experienced with her prior heart pain.  History of multiple stents since CABG.  Most recent catheterization March 07, 2021 with occlusion of most of her grafts and aggressive medical therapy was recommended.  Troponin was mildly elevated at 55, 1 delta troponin was greater than 500 concern for non-STEMI.  Third troponin was over 12,000.   Past Medical History:  Diagnosis Date   Anemia    Anxiety    Asthma    CAD (coronary artery disease)    Multivessel s/p CABG 2005, numerous PCIs since that time and documented graft disease   Carotid artery disease ([place])    R CEA   ESRD on hemodialysis ([place])    Essential hypertension    Gout    History of blood transfusion    Hyperlipidemia    Hypothyroidism    Myocardial infarction [facility])    PAD (peripheral artery disease) ([place])    Dr. Kellie Simmering   Pneumonia 09/2019, 11/2019   S/P angioplasty with stent- DES to mRCA and to LIMA to LAD with DES 04/09/18.   04/10/2018   SBO (small bowel obstruction) ([place]) 2011   Status post lysis of adhesions & hernia repair   Sinus bradycardia    Type 2 diabetes mellitus ([place])    Umbilical hernia     Past Surgical History:  Procedure  Laterality Date   AORTIC ARCH ANGIOGRAPHY N/A 08/02/2020   Procedure: AORTIC ARCH ANGIOGRAPHY;  Surgeon: Waynetta Sandy, MD;  Location: [place] CV LAB;  Service: Cardiovascular;  Laterality: N/A;  Lt upper extermity   AV FISTULA PLACEMENT Left 06/29/2020   Procedure: LEFT ARM ARTERIOVENOUS (AV) FISTULA;  Surgeon: Waynetta Sandy, MD;  Location: [place];  Service: Vascular;  Laterality: Left;  ARM   AV FISTULA PLACEMENT Right 05/11/2021   Procedure: RIGHT BRACHIOCEPHALIC ARTERIOVENOUS (AV) FISTULA CREATION;  Surgeon: Waynetta Sandy, MD;  Location: [facility] OR;  Service: Vascular;  Laterality: Right;   [place]  2010   CORONARY ARTERY BYPASS GRAFT  2005   CORONARY BALLOON ANGIOPLASTY N/A 05/31/2017   Procedure: CORONARY BALLOON ANGIOPLASTY;  Surgeon: Jettie Booze, MD;  Location: [place] CV LAB;  Service: Cardiovascular;  Laterality: N/A;   CORONARY BALLOON ANGIOPLASTY N/A 06/14/2021   Procedure: CORONARY BALLOON ANGIOPLASTY;  Surgeon: Jettie Booze, MD;  Location: [place] CV LAB;  Service: Cardiovascular;  Laterality: N/A;   CORONARY STENT INTERVENTION N/A 05/31/2017   Procedure: CORONARY STENT INTERVENTION;  Surgeon: Jettie Booze, MD;  Location: [place] CV LAB;  Service: Cardiovascular;  Laterality: N/A;   CORONARY STENT INTERVENTION N/A 04/09/2018   Procedure: CORONARY STENT INTERVENTION;  Surgeon: Larae Grooms  S, MD;  Location: [place] CV LAB;  Service: Cardiovascular;  Laterality: N/A;  SVG RCA   CORONARY STENT INTERVENTION N/A 01/10/2021   Procedure: CORONARY STENT INTERVENTION;  Surgeon: Leonie Man, MD;  Location: [place] CV LAB;  Service: Cardiovascular;  Laterality: N/A;   CORONARY/GRAFT ACUTE MI REVASCULARIZATION N/A 02/24/2021   Procedure: Coronary/Graft Acute MI Revascularization;  Surgeon: Jettie Booze, MD;  Location: [place] CV LAB;  Service: Cardiovascular;   Laterality: N/A;   ENDARTERECTOMY Right 04/18/2013   Procedure: ENDARTERECTOMY CAROTID;  Surgeon: Mal Misty, MD;  Location: [place];  Service: Vascular;  Laterality: Right;   HERNIA REPAIR  1989   Incisional hernia repair x2  03/04/2010   Laparoscopic with 35cm mesh by Dr Ronnald Collum   IR [place] CV LINE RIGHT  06/21/2020   IR US GUIDE VASC ACCESS RIGHT  06/21/2020   LEFT HEART CATH AND CORS/GRAFTS ANGIOGRAPHY N/A 05/31/2017   Procedure: LEFT HEART CATH AND CORS/GRAFTS ANGIOGRAPHY;  Surgeon: Jettie Booze, MD;  Location: [place] CV LAB;  Service: Cardiovascular;  Laterality: N/A;   LEFT HEART CATH AND CORS/GRAFTS ANGIOGRAPHY N/A 04/08/2018   Procedure: LEFT HEART CATH AND CORS/GRAFTS ANGIOGRAPHY;  Surgeon: Jettie Booze, MD;  Location: [place] CV LAB;  Service: Cardiovascular;  Laterality: N/A;   LEFT HEART CATH AND CORS/GRAFTS ANGIOGRAPHY N/A 06/22/2020   Procedure: LEFT HEART CATH AND CORS/GRAFTS ANGIOGRAPHY;  Surgeon: Belva Crome, MD;  Location: [place] CV LAB;  Service: Cardiovascular;  Laterality: N/A;   LEFT HEART CATH AND CORS/GRAFTS ANGIOGRAPHY N/A 01/10/2021   Procedure: LEFT HEART CATH AND CORS/GRAFTS ANGIOGRAPHY;  Surgeon: Leonie Man, MD;  Location: [place] [facility] LAB;  Service: Cardiovascular;  Laterality: N/A;   LEFT HEART CATH AND CORS/GRAFTS ANGIOGRAPHY N/A 02/24/2021   Procedure: LEFT HEART CATH AND CORS/GRAFTS ANGIOGRAPHY;  Surgeon: Jettie Booze, MD;  Location: [place] CV LAB;  Service: Cardiovascular;  Laterality: N/A;   LEFT HEART CATH AND CORS/GRAFTS ANGIOGRAPHY N/A 06/14/2021   Procedure: LEFT HEART CATH AND CORS/GRAFTS ANGIOGRAPHY;  Surgeon: Jettie Booze, MD;  Location: [place] [facility] LAB;  Service: Cardiovascular;  Laterality: N/A;   LEFT HEART CATHETERIZATION WITH CORONARY ANGIOGRAM N/A 12/19/2012   Procedure: LEFT HEART CATHETERIZATION WITH CORONARY ANGIOGRAM;  Surgeon: Josue Hector, MD;  Location: [facility] CATH LAB;  Service:  Cardiovascular;  Laterality: N/A;   LEFT HEART CATHETERIZATION WITH CORONARY/GRAFT ANGIOGRAM N/A 04/19/2013   Procedure: LEFT HEART CATHETERIZATION WITH Beatrix Fetters;  Surgeon: Lorretta Harp, MD;  Location: [facility] CATH LAB;  Service: Cardiovascular;  Laterality: N/A;   LIGATION OF ARTERIOVENOUS  FISTULA Left 09/15/2020   Procedure: LIGATION OF LEFT ARM ARTERIOVENOUS  FISTULA;  Surgeon: Waynetta Sandy, MD;  Location: [place];  Service: Vascular;  Laterality: Left;   PATCH ANGIOPLASTY Right 04/18/2013   Procedure: PATCH ANGIOPLASTY Right Internal Carotid Artery;  Surgeon: Mal Misty, MD;  Location: [place];  Service: Vascular;  Laterality: Right;   PERCUTANEOUS CORONARY STENT INTERVENTION (PCI-S) Right 12/19/2012   Procedure: PERCUTANEOUS CORONARY STENT INTERVENTION (PCI-S);  Surgeon: Josue Hector, MD;  Location: [facility] CATH LAB;  Service: Cardiovascular;  Laterality: Right;   PERIPHERAL VASCULAR INTERVENTION Left 08/02/2020   Procedure: PERIPHERAL VASCULAR INTERVENTION;  Surgeon: Waynetta Sandy, MD;  Location: [place] CV LAB;  Service: Cardiovascular;  Laterality: Left;  Left subclavian   PERIPHERAL VASCULAR INTERVENTION  02/24/2021   Procedure: PERIPHERAL VASCULAR INTERVENTION;  Surgeon: Marty Heck, MD;  Location: [place] CV LAB;  Service: Vascular;;   SHOULDER SURGERY      No current facility-administered medications for this visit.   No current outpatient medications on file.   Facility-Administered Medications Ordered in Other Visits  Medication Dose Route Frequency Provider Last Rate Last Admin   0.9 %  sodium chloride infusion  250 mL Intravenous PRN Jettie Booze, MD       acetaminophen (TYLENOL) tablet 650 mg  650 mg Oral Q4H PRN Jettie Booze, MD       albuterol (PROVENTIL) (2.5 MG/3ML) 0.083% nebulizer solution 2.5 mg  2.5 mg Inhalation Q4H PRN Domenic Polite, MD   2.5 mg at 06/16/21 0901   alum & mag hydroxide-simeth  (MAALOX/MYLANTA) 200-200-20 MG/5ML suspension 30 mL  30 mL Oral Q4H PRN Domenic Polite, MD   30 mL at 06/16/21 1225   aspirin chewable tablet 81 mg  81 mg Oral Daily Jettie Booze, MD   81 mg at 06/16/21 0901   atorvastatin (LIPITOR) tablet 80 mg  80 mg Oral QPM Jettie Booze, MD   80 mg at 06/15/21 1803   Chlorhexidine Gluconate Cloth 2 % PADS 6 each  6 each Topical Daily Domenic Polite, MD       clopidogrel (PLAVIX) tablet 75 mg  75 mg Oral Q breakfast Jettie Booze, MD   75 mg at 06/16/21 0855   ezetimibe (ZETIA) tablet 10 mg  10 mg Oral Daily Jettie Booze, MD   10 mg at 06/16/21 0901   heparin injection 5,000 Units  5,000 Units Subcutaneous Q8H Jettie Booze, MD   5,000 Units at 06/16/21 0532   insulin aspart (novoLOG) injection 0-5 Units  0-5 Units Subcutaneous QHS Larae Grooms S, MD       insulin aspart (novoLOG) injection 0-6 Units  0-6 Units Subcutaneous TID WC Jettie Booze, MD   3 Units at 06/16/21 1230   ipratropium (ATROVENT) nebulizer solution 0.5 mg  0.5 mg Inhalation Q4H PRN Domenic Polite, MD       levothyroxine (SYNTHROID) tablet 112 mcg  112 mcg Oral QAC breakfast Jettie Booze, MD   112 mcg at 06/16/21 0856   midodrine (PROAMATINE) tablet 10 mg  10 mg Oral BID WC Domenic Polite, MD   10 mg at 06/16/21 1438   pantoprazole (PROTONIX) EC tablet 40 mg  40 mg Oral BID Domenic Polite, MD   40 mg at 06/16/21 1225   polyethylene glycol (MIRALAX / GLYCOLAX) packet 17 g  17 g Oral Daily Domenic Polite, MD       sodium chloride flush (NS) 0.9 % injection 3 mL  3 mL Intravenous Q12H Jettie Booze, MD   3 mL at 06/16/21 0857   sodium chloride flush (NS) 0.9 % injection 3 mL  3 mL Intravenous PRN Jettie Booze, MD       trimethobenzamide Sherre Poot) capsule 300 mg  300 mg Oral TID PRN Chotiner, Yevonne Aline, MD   300 mg at 06/16/21 8921   Allergies:  Penicillins and Beta adrenergic blockers   Social History: The patient   reports that she quit smoking about 8 years ago. Her smoking use included cigarettes. She has a 20.00 pack-year smoking history. She has never used smokeless tobacco. She reports that she does not drink alcohol and does not use drugs.   Family History: The patient's family history includes AAA (abdominal aortic aneurysm) in her father; Diabetes in her mother; Heart disease in her  brother and mother; Hyperlipidemia in her mother and son; Hypertension in her brother, father, mother, and son; Thyroid disease in her father.   ROS:  Please see the history of present illness. Otherwise, complete review of systems is positive for {NONE DEFAULTED:18576}.  All other systems are reviewed and negative.   Physical Exam: VS:  There were no vitals taken for this visit., BMI There is no height or weight on file to calculate BMI.  Wt Readings from Last 3 Encounters:  06/16/21 216 lb 11.4 oz (98.3 kg)  06/01/21 214 lb 9.6 oz (97.3 kg)  05/28/21 210 lb (95.3 kg)    General: Patient appears comfortable at rest. HEENT: Conjunctiva and lids normal, oropharynx clear with moist mucosa. Neck: Supple, no elevated JVP or carotid bruits, no thyromegaly. Lungs: Clear to auscultation, nonlabored breathing at rest. Cardiac: Regular rate and rhythm, no S3 or significant systolic murmur, no pericardial rub. Abdomen: Soft, nontender, no hepatomegaly, bowel sounds present, no guarding or rebound. Extremities: No pitting edema, distal pulses 2+. Skin: Warm and dry. Musculoskeletal: No kyphosis. Neuropsychiatric: Alert and oriented x3, affect grossly appropriate.  ECG:  {EKG/Telemetry Strips Reviewed:[phone]}  Recent Labwork: 06/18/2020: TSH 1.057 05/28/2021: B Natriuretic Peptide 205.0 06/14/2021: ALT 17; AST 58; Magnesium 2.2 06/16/2021: BUN 36; Creatinine, Ser 5.40; Hemoglobin 10.1; Platelets 161; Potassium 4.2; Sodium 135     Component Value Date/Time   CHOL 168 02/23/2021 0446   TRIG 257 (H) 02/23/2021 0446    HDL 41 02/23/2021 0446   CHOLHDL 4.1 02/23/2021 0446   VLDL 51 (H) 02/23/2021 0446   LDLCALC 76 02/23/2021 0446    Other Studies Reviewed Today: Procedures  CORONARY BALLOON ANGIOPLASTY            06/14/2021  LEFT HEART CATH AND CORS/GRAFTS ANGIOGRAPHY   Conclusion      Ost LM to Dist LM lesion is 95% stenosed-severe restenosis   Prox LAD to Mid LAD lesion is 100% stenosed.   Ost LAD to Prox LAD lesion is 100% stenosed.  LIMA to LAD is patent.  Ostial LIMA stent with 30% in-stent restenosis.   Ost Cx to Mid Cx lesion is 99% stenosed.  SVG to OM/LPDA is occluded.   1st Mrg-1 lesion is 99% stenosed.   1st Mrg-2 lesion is 100% stenosed.   Origin to Prox Graft lesion is 30% stenosed.   Prox RCA lesion is 100% stenosed.  Severe in-stent restenosis in the proximal graft.   Prox Graft-1 lesion is 90% stenosed.   Scoring balloon angioplasty was performed using a 3.5 mm core flex balloon.  This was followed with angioplasty by [place] 4.0X12.   Post intervention, there is a 0% residual stenosis.   LIMA and is normal in caliber.   LV end diastolic pressure is mildly elevated.   There is no aortic valve stenosis.   Severe three-vessel coronary artery disease.  Culprit for this presentation was likely the severe in-stent restenosis in the SVG to RCA.  Known in-stent restenosis of prior left main stent. Patent ostial LIMA stent with only 30% in-stent restenosis.     Mild volume overload.  The patient is due for dialysis.  Diagnostic Dominance: Co-dominant Intervention    Echocardiogram 06/15/2021 IMPRESSIONS   1. Poor endocardial visualization even with contrast Appears to be some  inferior basal wall hypokineis on contrast images . Left ventricular  ejection fraction, by estimation, is 50 to 55%. The left ventricle has low  normal function. The left ventricle  demonstrates regional  wall motion abnormalities (see scoring  diagram/findings for description). There is moderate  left ventricular  hypertrophy. Left ventricular diastolic parameters are consistent with  Grade II diastolic dysfunction  (pseudonormalization). Elevated left ventricular end-diastolic pressure.   2. Right ventricular systolic function is normal. The right ventricular  size is normal.   3. Left atrial size was mildly dilated.   4. The mitral valve is abnormal. Trivial mitral valve regurgitation. No  evidence of mitral stenosis.   5. The aortic valve is tricuspid. Aortic valve regurgitation is not  visualized. Mild to moderate aortic valve sclerosis/calcification is  present, without any evidence of aortic stenosis.   6. The inferior vena cava is normal in size with greater than 50%  respiratory variability, suggesting right atrial pressure of 3 mmHg.    PERIPHERAL VASCULAR INTERVENTION     02/24/2021   Conclusion  Patient name: TANASIA BUDZINSKI          MRN: [id number]        DOB: Jul 03, 1956        Sex: female   02/24/2021 Pre-operative Diagnosis: In-stent stenosis in left subclavian stent in the setting of NSTEMI and limited flow to LIMA graft Post-operative diagnosis:  Same Surgeon:  Marty Heck, MD Procedure Performed: 1.  Left subclavian arteriogram  2.  Angioplasty of left subclavian stent (4 mm and 5 mm Sterling and 6 mm Mustang) 3.  Left subclavian stent using a covered stent (7 mm x 19 mm VBX)   Indications: 65 year old female that was admitted with chest pain and found to have an NSTEMI.  Further work-up was concerning for in-stent stenosis of her left subclavian stent in the setting of LIMA graft after previous CABG.  She had a left subclavian stent placed by Dr. Donzetta Matters last year.  I was called during the procedure to evaluate her left subclavian stent after it was noted there was a 30 mmHg pressure gradient across the stent during cardiac intervention by interventional cardiology.  I did get consent from her son over the phone given intraprocedure consult.   Findings:  My initial plan was to realign her left subclavian stent with a covered stent.  Unfortunately I could not get an 035 balloon to cross the in-stent stenosis given it was high-grade and ultimately had to cross the left subclavian stent with an 018 system and predilated this in-stent stenosis with a 4 mm and 5 mm Sterling.  I still could not get the VBX stent to cross so I then upsized to an 035 system using a Rosen wire in the descending thoracic aorta and finally predilated with a 6 mm Mustang.  Finally I was able to get the stent to track across the left subclavian in-stent stenosis and I realigned the previous stent with a 7 mm x 19 mm VBX.  I did make this stent a little more distal in the subclavian artery given it appeared she had some disease in the subclavian distal to the previous stent but below the LIMA graft.  Final pullback pressure showed no significant gradient across the subclavian stent after restenting and there was a 30 mm Hg point gradient at the start of the case.              Procedure: I was called to lab 9 to evaluate her left subclavian stent and brachial access had been previously obtained by Dr. Irish Lack.  In evaluating the images, it did appear that there was a high-grade greater than 70 to 80%  in-stent stenosis in the left subclavian stent and this had a pressure gradient of greater than 30 mmHg as measured by cardiology.  Once Dr. Irish Lack was finished with his portion of the procedure, I then used a versa core wire to exchange for a long 7 [place] destination sheath in the left brachial artery.  I advanced this all the way into the axillary artery.  Initially I planned to realign her existing subclavian stent which was bare-metal with a covered stent.  It became apparent that this was a very tight lesion and I could not even get an 035 catheter to track across the wire in the left subclavian stent and met resistance.  Ultimately initially tried to also cross a 4 mm Mustang balloon  unsuccessful.  I then downsized to a V 18 wire and a smaller system and finally was able to get a 4 mm Sterling to cross and predilated the left subclavian in-stent stenosis with a 4 mm Sterling to nominal pressure for 2 minutes.  I then used a 5 mm Sterling to nominal pressure for 2 minutes.  That point time I tried to place a 7 mm VBX inside the existing stent but this would not track even after pre-dilation.  I then used a KMP catheter to cross the stent and exchanged for a stiffer Rosen wire down in the descending thoracic aorta.  Additional 3000 units of IV heparin was given we did check an ACT to maintain greater than 250.  At this point in time I advanced the sheath as far as I could into the left subclavian artery but it would not track across the left subclavian stent.  I then elected to finally predilate th left subclavian stent with a 6 mm x 20 mm Mustang over the [place] system.  Finally I was able to get the VBX to track after dilation with a 6 mm baloon and I stented her proximal left subclavian making sure the stent was a little more distal given there appeared to be some calcified disease just distal to her previous stent.  This was deployed to nominal pressure inflating it to about 6.5 mm given her previous stent was 6 mm.  Final injection showed filling of the LIMA graft with no evidence of residual stenosis.  I did put a KMP catheter across the stent in her aortic arch and did a pullback pressure and there was no residual gradient at completion in the left subclavian stent.  Wires and catheters were removed.  She was taken to holding to have the sheath removed.      Assessment and Plan:  1. NSTEMI (non-ST elevated myocardial infarction) ([place])   2. CAD in native artery   3. Mixed hyperlipidemia   4. ESRD on dialysis [facility])      Medication Adjustments/Labs and Tests Ordered: Current medicines are reviewed at length with the patient today.  Concerns regarding medicines are outlined above.    Disposition: Follow-up with ***  Signed, Levell July, NP 06/16/2021 4:37 PM    [facility] at [facility] [address], [place], [place] [address] Phone: [phone]; Fax: [phone]

## 2021-06-16 NOTE — Progress Notes (Signed)
Notified by RN that pt has nausea. Had zofran IV but did not help and request oral zofran as pt stated it works better Pt has nausea but no vomiting. States can take a pill for her nausea.  Reviewed pt chart.  Has prolonged QT D/C zofran with prolonged QT and Tigan po ordered.

## 2021-06-17 ENCOUNTER — Ambulatory Visit: Payer: Medicare HMO | Admitting: Family Medicine

## 2021-06-17 ENCOUNTER — Ambulatory Visit (HOSPITAL_COMMUNITY)
Admission: RE | Admit: 2021-06-17 | Discharge: 2021-06-17 | Disposition: A | Discharge status: Home / Self Care | Payer: Medicare HMO | Source: Ambulatory Visit | Attending: Family Medicine | Admitting: Family Medicine

## 2021-06-17 DIAGNOSIS — I2510 Atherosclerotic heart disease of native coronary artery without angina pectoris: Secondary | ICD-10-CM

## 2021-06-17 DIAGNOSIS — I214 Non-ST elevation (NSTEMI) myocardial infarction: Secondary | ICD-10-CM

## 2021-06-17 DIAGNOSIS — E782 Mixed hyperlipidemia: Secondary | ICD-10-CM

## 2021-06-17 DIAGNOSIS — E1122 Type 2 diabetes mellitus with diabetic chronic kidney disease: Secondary | ICD-10-CM | POA: Diagnosis not present

## 2021-06-17 DIAGNOSIS — Z992 Dependence on renal dialysis: Secondary | ICD-10-CM | POA: Diagnosis not present

## 2021-06-17 DIAGNOSIS — N186 End stage renal disease: Secondary | ICD-10-CM

## 2021-06-17 LAB — CBC
HCT: 31.7 % — ABNORMAL LOW (ref 36.0–46.0)
Hemoglobin: 10.1 g/dL — ABNORMAL LOW (ref 12.0–15.0)
MCH: 33.4 pg (ref 26.0–34.0)
MCHC: 31.9 g/dL (ref 30.0–36.0)
MCV: 105 fL — ABNORMAL HIGH (ref 80.0–100.0)
Platelets: 167 10*3/uL (ref 150–400)
RBC: 3.02 MIL/uL — ABNORMAL LOW (ref 3.87–5.11)
RDW: 15.9 % — ABNORMAL HIGH (ref 11.5–15.5)
WBC: 8.9 10*3/uL (ref 4.0–10.5)
nRBC: 0 % (ref 0.0–0.2)

## 2021-06-17 LAB — BASIC METABOLIC PANEL
Anion gap: 10 (ref 5–15)
BUN: 15 mg/dL (ref 8–23)
CO2: 23 mmol/L (ref 22–32)
Calcium: 8.8 mg/dL — ABNORMAL LOW (ref 8.9–10.3)
Chloride: 102 mmol/L (ref 98–111)
Creatinine, Ser: 3.37 mg/dL — ABNORMAL HIGH (ref 0.44–1.00)
GFR, Estimated: 15 mL/min — ABNORMAL LOW (ref >60)
Glucose, Bld: 155 mg/dL — ABNORMAL HIGH (ref 70–99)
Potassium: 3.6 mmol/L (ref 3.5–5.1)
Sodium: 135 mmol/L (ref 135–145)

## 2021-06-17 LAB — GLUCOSE, CAPILLARY: Glucose-Capillary: 157 mg/dL — ABNORMAL HIGH (ref 70–99)

## 2021-06-17 MED ORDER — RANOLAZINE ER 500 MG PO TB12: 500.0000 mg | ORAL_TABLET | Freq: Every day | ORAL | Status: DC

## 2021-06-17 MED ORDER — NOVOLIN 70/30 FLEXPEN (70-30) 100 UNIT/ML ~~LOC~~ SUPN: 20.0000 [IU] | PEN_INJECTOR | Freq: Two times a day (BID) | SUBCUTANEOUS | Status: DC

## 2021-06-17 MED ORDER — GABAPENTIN 400 MG PO CAPS: 400.0000 mg | ORAL_CAPSULE | Freq: Every day | ORAL | Status: DC

## 2021-06-17 MED ORDER — CYCLOBENZAPRINE HCL 5 MG PO TABS: 5.0000 mg | ORAL_TABLET | Freq: Two times a day (BID) | ORAL | 0 refills | Status: DC

## 2021-06-17 MED ORDER — MIDODRINE HCL 10 MG PO TABS: 10.0000 mg | ORAL_TABLET | Freq: Two times a day (BID) | ORAL | 0 refills | Status: AC

## 2021-06-17 NOTE — Progress Notes (Signed)
CARDIAC REHAB PHASE I   PRE:  Rate/Rhythm: 86 SR    BP: lying 76/32    SaO2: 100 RA  MODE:  Ambulation: 420 ft   POST:  Rate/Rhythm: 106 ST    BP: sitting 94/36     SaO2: 99 RA  BP has been registering significantly low this am. Pt asx, eager to d/c. Able to walk without dizziness, weakness, or CP. Feels like she did yesterday. Encouraged pt to go by her sx since her BP is so difficult to read.  [id number]   Darrick Meigs CES, ACSM 06/17/2021 10:26 AM

## 2021-06-17 NOTE — Discharge Summary (Signed)
Physician Discharge Summary  Donna Howe ZDG:[id number] DOB: June 26, 1956 DOA: 06/13/2021  PCP: Curlene Labrum, MD  Admit date: 06/13/2021 Discharge date: 06/17/2021  Time spent: 3minutes  Recommendations for Outpatient Follow-up:  Cardiology Dr.McDowell in 2weeks Nephrology at hemodialysis TTS, attempt to lower dry weight slowly as tolerated, continue midodrine for chronic hypotension   Discharge Diagnoses:  Principal Problem:   NSTEMI (non-ST elevated myocardial infarction) ([place]) Active Problems:   Mixed hyperlipidemia   Obesity (BMI 30-39.9)   CAD (coronary artery disease)   Hx of CABG   S/P angioplasty with stent- DES to [facility] and to LIMA to LAD with DES 04/09/18.     Hypothyroidism (acquired)   Elevated troponin I level   Hyperglycemia due to diabetes mellitus (HCC)   GERD (gastroesophageal reflux disease)   ESRD on dialysis ([place])   Leukocytosis   Elevated MCV   Pulmonary edema   Discharge Condition: Stable  Diet recommendation: Renal diet  Filed Weights   06/15/21 0241 06/16/21 1425 06/16/21 1822  Weight: 99.2 kg 98.3 kg 95.8 kg    History of present illness:  65 yo female with the past medical history of CAD sp CABG (2005), ESRD on HD, T2DM and obesity who presented with chest pain. Intermittent chest pain for 2 days, worsening, precordial, pressure in nature, 10/10 in intensity, radiated to the left shoulder and associated with diaphoresis.  Patient has chronic stable angina, using nitroglycerin for pain control.  Over the last 2 weeks she had worsening angina symptoms consistent with unstable angina -She has an extensive history of coronary artery disease and stable angina difficult to control.  Blood pressure has been a limiting factor for medical therapy. She follows with Dr. Domenic Polite in the cardiology clinic.   In April 2022 patient had a non-STEMI, had 2 drug-eluting stents placed to the SVG-RCA. In June 2022 she had another ST elevation myocardial  infarction, 75% stenosis along the SVG to PDA which was treated with balloon angioplasty and drug-eluting stent placement to 80% stenosis along the mid graft lesion.   -Her high-sensitivity troponin trended up to 12,000, she was subsequent transferred to [facility] Course:   NSTEMI/ CAD sp CABG and multiple PCIs -Treated with heparin drip, hypotension limits beta-blocker use -Cardiology consulted, underwent left heart cath, noted to have severe three-vessel CAD, culprit lesion was suspected to be severe in-stent restenosis and SVG to RCA, this was treated with balloon angioplasty 9/27 -Recommended to continue aspirin Plavix daily -Ranexa daily recommended -Discharged home in a stable condition, follow-up with Dr. Domenic Polite in 2 weeks   Chronic hypotension -Blood pressure is checked in her legs unfortunately, limiting accuracy -History of chronic hypotension limiting fluid removal at HD -Continue midodrine, dose increased to 10 mg twice daily   Abdominal discomfort, GERD, constipation -Resolved with PPI and laxatives    ESRD on HD. T-TH-Sat -Nephrology following, fluid removal with HD as blood pressure tolerates, clinically appears slightly volume overloaded -Dialyzed yesterday, felt to be stable overall, needs midodrine twice daily to support blood pressure -Follow-up with nephrology, continue to lower dry weight as tolerated    T2DM/ dyslipidemia  -CBGs are stable -On insulin 70/30 at baseline, required very low doses while inpatient, dose decreased at discharge   Hypothyroid. Continue with levothyroxine.    Obesity class 2. Calculated BMI is 35,5       Procedures: left heart cath 9/27 Cardiac cath 06/14/21   Ost LM to Dist LM lesion is 95% stenosed-severe restenosis  Prox LAD to Mid LAD lesion is 100% stenosed.   Ost LAD to Prox LAD lesion is 100% stenosed.  LIMA to LAD is patent.  Ostial LIMA stent with 30% in-stent restenosis.   Ost Cx to Mid Cx lesion  is 99% stenosed.  SVG to OM/LPDA is occluded.   1st Mrg-1 lesion is 99% stenosed.   1st Mrg-2 lesion is 100% stenosed.   Origin to Prox Graft lesion is 30% stenosed.   Prox RCA lesion is 100% stenosed.  Severe in-stent restenosis in the proximal graft.   Prox Graft-1 lesion is 90% stenosed.   Scoring balloon angioplasty was performed using a 3.5 mm core flex balloon.  This was followed with angioplasty by [facility] Lilly 4.0X12.   Post intervention, there is a 0% residual stenosis.   LIMA and is normal in caliber.   LV end diastolic pressure is mildly elevated.   There is no aortic valve stenosis.   Severe three-vessel coronary artery disease.  Culprit for this presentation was likely the severe in-stent restenosis in the SVG to RCA.  Known in-stent restenosis of prior left main stent. Patent ostial LIMA stent with only 30% in-stent restenosis.      Consultations: Neurology, nephrology  Discharge Exam: Vitals:   06/17/21 0435 06/17/21 0948  BP: (!) 91/46 (!) 63/33  Pulse: 74 78  Resp: 18   Temp: 97.9 F (36.6 C) 98.8 F (37.1 C)  SpO2: 94% 98%    General: AAOx3 Cardiovascular: S1S2/RRR Respiratory: CTAB  Discharge Instructions   Discharge Instructions     AMB Referral to Cardiac Rehabilitation - Phase II   Complete by: As directed    Diagnosis:  Coronary Stents NSTEMI     After initial evaluation and assessments completed: Virtual Based Care may be provided alone or in conjunction with Phase 2 Cardiac Rehab based on patient barriers.: Yes   Discharge instructions   Complete by: As directed    Renal Diabetic   Increase activity slowly   Complete by: As directed       Allergies as of 06/17/2021       Reactions   Penicillins Other (See Comments)   REACTION: Unknown, told as a child Has patient had a PCN reaction causing immediate rash, facial/tongue/throat swelling, SOB or lightheadedness with hypotension: Unknown Has patient had a PCN reaction causing severe  rash involving mucus membranes or skin necrosis: Unknown Has patient had a PCN reaction that required hospitalization: Unknown Has patient had a PCN reaction occurring within the last 10 years: No If all of the above answers are "NO", then may proceed with Cephalosporin use.   Beta Adrenergic Blockers Other (See Comments)   Hypotension        Medication List     STOP taking these medications    oxyCODONE-acetaminophen 5-325 MG tablet Commonly known as: Percocet       TAKE these medications    Albuterol Sulfate 108 (90 Base) MCG/ACT Aepb Commonly known as: PROAIR RESPICLICK Inhale 2 puffs into the lungs every 6 (six) hours as needed (Shortness of breath).   allopurinol 100 MG tablet Commonly known as: Zyloprim Take one tablet after hemodialysis, on Tuesday, Thursday and Saturday. What changed:  how much to take how to take this when to take this additional instructions   ALPRAZolam 0.5 MG tablet Commonly known as: XANAX Take 0.25-0.5 mg by mouth See admin instructions. Take 0.25 in the morning and evening and 0.5 mg at bedtime   aspirin EC 81 MG  tablet Take 81 mg by mouth daily.   atorvastatin 80 MG tablet Commonly known as: LIPITOR Take 1 tablet (80 mg total) by mouth every evening. What changed: when to take this   clopidogrel 75 MG tablet Commonly known as: PLAVIX Take 1 tablet (75 mg total) by mouth daily.   cyclobenzaprine 5 MG tablet Commonly known as: FLEXERIL Take 1 tablet (5 mg total) by mouth 2 (two) times daily with a meal.   diclofenac Sodium 1 % Gel Commonly known as: VOLTAREN Apply 2-4 g topically daily as needed (FOR NECK PAIN).   ezetimibe 10 MG tablet Commonly known as: ZETIA Take 1 tablet (10 mg total) by mouth daily. What changed: when to take this   ferric citrate 1 GM 210 MG(Fe) tablet Commonly known as: AURYXIA Take 420 mg by mouth 3 (three) times daily with meals.   gabapentin 400 MG capsule Commonly known as: NEURONTIN Take  1 capsule (400 mg total) by mouth at bedtime. What changed: when to take this   ipratropium 17 MCG/ACT inhaler Commonly known as: ATROVENT HFA Inhale 2 puffs into the lungs every 4 (four) hours as needed for wheezing.   levothyroxine 112 MCG tablet Commonly known as: SYNTHROID Take 112 mcg by mouth daily before breakfast.   midodrine 10 MG tablet Commonly known as: PROAMATINE Take 1 tablet (10 mg total) by mouth 2 (two) times daily with a meal. What changed:  medication strength how much to take when to take this   MIRCERA IJ Mircera   multivitamin Tabs tablet Take 1 tablet by mouth daily.   nitroGLYCERIN 0.4 MG SL tablet Commonly known as: NITROSTAT Place 1 tablet (0.4 mg total) under the tongue every 5 (five) minutes x 3 doses as needed for chest pain.   NovoLIN 70/30 Kwikpen (70-30) 100 UNIT/ML KwikPen Generic drug: insulin isophane & regular human KwikPen Inject 20 Units into the skin in the morning and at bedtime. What changed: how much to take   ondansetron 8 MG tablet Commonly known as: ZOFRAN Take 8 mg by mouth every 8 (eight) hours as needed for nausea.   OneTouch Ultra test strip Generic drug: glucose blood Check blood glucose THREE TIMES DAILY   pantoprazole 40 MG tablet Commonly known as: PROTONIX Take 1 tablet (40 mg total) by mouth 2 (two) times daily.   ranolazine 500 MG 12 hr tablet Commonly known as: RANEXA Take 1 tablet (500 mg total) by mouth at bedtime.   Vitamin D (Ergocalciferol) 1.25 MG (50000 UNIT) Caps capsule Commonly known as: DRISDOL Take 50,000 Units by mouth every Sunday.       Allergies  Allergen Reactions   Penicillins Other (See Comments)    REACTION: Unknown, told as a child Has patient had a PCN reaction causing immediate rash, facial/tongue/throat swelling, SOB or lightheadedness with hypotension: Unknown Has patient had a PCN reaction causing severe rash involving mucus membranes or skin necrosis: Unknown Has patient  had a PCN reaction that required hospitalization: Unknown Has patient had a PCN reaction occurring within the last 10 years: No If all of the above answers are "NO", then may proceed with Cephalosporin use.    Beta Adrenergic Blockers Other (See Comments)    Hypotension    Follow-up Information     Verta Ellen., NP Follow up on 06/17/2021.   Specialty: Cardiology Why: keep appt with Jonni Sanger in [place] for cardiology. Contact information: [address] [place] [address] [phone]  The results of significant diagnostics from this hospitalization (including imaging, microbiology, ancillary and laboratory) are listed below for reference.    Significant Diagnostic Studies: DG Chest 2 View  Result Date: 06/13/2021 CLINICAL DATA:  Chest pain EXAM: CHEST - 2 VIEW COMPARISON:  05/28/2021 FINDINGS: The lungs are symmetrically well expanded. Trace interstitial infiltrate has developed diffusely most in keeping with mild interstitial pulmonary edema. No pneumothorax or pleural effusion. Right internal jugular hemodialysis catheter tip noted within the right atrium. Cardiac size within normal limits. Median sternotomy has been performed. No acute bone abnormality. IMPRESSION: Interval development of mild interstitial pulmonary edema. Electronically Signed   By: Fidela Salisbury M.D.   On: 06/13/2021 22:17   DG Chest 2 View  Result Date: 05/28/2021 CLINICAL DATA:  65 year old female with chest pain. EXAM: CHEST - 2 VIEW COMPARISON:  Chest radiographs 02/22/2021 and earlier. FINDINGS: Chronic right chest dual lumen dialysis type catheter. Lung volumes and mediastinal contours are stable, within normal limits. Prior CABG. Visualized tracheal air column is within normal limits. Stable pulmonary vascularity, no overt edema. No pneumothorax, pleural effusion, acute or confluent pulmonary opacity. Abdominal Calcified aortic atherosclerosis. No acute osseous abnormality  identified. Cholecystectomy clips. Negative visible bowel gas pattern. IMPRESSION: No acute cardiopulmonary abnormality. Electronically Signed   By: Genevie Ann M.D.   On: 05/28/2021 11:14   US Abdomen Complete  Result Date: 06/08/2021 CLINICAL DATA:  Abdominal bloating. EXAM: ABDOMEN ULTRASOUND COMPLETE COMPARISON:  Renal ultrasound 10/02/2019. FINDINGS: Gallbladder: Surgically absent. Common bile duct: Diameter: 10.2 mm. Liver: No focal lesion identified. Within normal limits in parenchymal echogenicity. Portal vein is patent on color Doppler imaging with normal direction of blood flow towards the liver. IVC: No abnormality visualized. Pancreas: Visualized portion unremarkable. Spleen: Size and appearance within normal limits. Right Kidney: Length: 8.5 cm (decreased from prior examination). There is increased echogenicity. There is no hydronephrosis. Left Kidney: Length: 8.2 cm (decreased from prior examination). There is increased echogenicity. There is no hydronephrosis. Abdominal aorta: No aneurysm visualized. Other findings: None. IMPRESSION: 1. Cholecystectomy. The common bile duct is dilated measuring 1 cm. This may be within normal limits; however, distal biliary obstruction cannot be excluded. Recommend correlation with lab values. 2. Progression of bilateral renal atrophy. The kidneys are echogenic likely related to medical renal disease. 3. Pancreas not well seen. Electronically Signed   By: Ronney Asters M.D.   On: 06/08/2021 21:59   CARDIAC CATHETERIZATION  Result Date: 06/14/2021   Ost LM to Dist LM lesion is 95% stenosed-severe restenosis   Prox LAD to Mid LAD lesion is 100% stenosed.   Ost LAD to Prox LAD lesion is 100% stenosed.  LIMA to LAD is patent.  Ostial LIMA stent with 30% in-stent restenosis.   Ost Cx to Mid Cx lesion is 99% stenosed.  SVG to OM/LPDA is occluded.   1st Mrg-1 lesion is 99% stenosed.   1st Mrg-2 lesion is 100% stenosed.   Origin to Prox Graft lesion is 30% stenosed.    Prox RCA lesion is 100% stenosed.  Severe in-stent restenosis in the proximal graft.   Prox Graft-1 lesion is 90% stenosed.   Scoring balloon angioplasty was performed using a 3.5 mm core flex balloon.  This was followed with angioplasty by [facility]kin Marmet 4.0X12.   Post intervention, there is a 0% residual stenosis.   LIMA and is normal in caliber.   LV end diastolic pressure is mildly elevated.   There is no aortic valve stenosis. Severe three-vessel coronary artery  disease.  Culprit for this presentation was likely the severe in-stent restenosis in the SVG to RCA.  Known in-stent restenosis of prior left main stent. Patent ostial LIMA stent with only 30% in-stent restenosis.  Mild volume overload.  The patient is due for dialysis.   ECHOCARDIOGRAM COMPLETE  Result Date: 06/15/2021    ECHOCARDIOGRAM REPORT   Patient Name:   Donna Howe Date of Exam: 06/15/2021 Medical Rec #:  [id number]        Height:       66.0 in Accession #:    [id number]       Weight:       218.7 lb Date of Birth:  1956/04/21       BSA:          2.077 m Patient Age:    66 years         BP:           98/27 mmHg Patient Gender: F                HR:           136 bpm. Exam Location:  Inpatient Procedure: 2D Echo, Color Doppler, Cardiac Doppler and Intracardiac            Opacification Agent Indications:    R07.9* Chest pain, unspecified  History:        Patient has prior history of Echocardiogram examinations, most                 recent 06/19/2020. CAD; Risk Factors:Hypertension and                 Dyslipidemia.  Sonographer:    Raquel Sarna Senior RDCS Referring Phys: [place]  1. Poor endocardial visualization even with contrast Appears to be some inferior basal wall hypokineis on contrast images . Left ventricular ejection fraction, by estimation, is 50 to 55%. The left ventricle has low normal function. The left ventricle demonstrates regional wall motion abnormalities (see scoring diagram/findings for  description). There is moderate left ventricular hypertrophy. Left ventricular diastolic parameters are consistent with Grade II diastolic dysfunction (pseudonormalization). Elevated left ventricular end-diastolic pressure.  2. Right ventricular systolic function is normal. The right ventricular size is normal.  3. Left atrial size was mildly dilated.  4. The mitral valve is abnormal. Trivial mitral valve regurgitation. No evidence of mitral stenosis.  5. The aortic valve is tricuspid. Aortic valve regurgitation is not visualized. Mild to moderate aortic valve sclerosis/calcification is present, without any evidence of aortic stenosis.  6. The inferior vena cava is normal in size with greater than 50% respiratory variability, suggesting right atrial pressure of 3 mmHg. FINDINGS  Left Ventricle: Poor endocardial visualization even with contrast Appears to be some inferior basal wall hypokineis on contrast images. Left ventricular ejection fraction, by estimation, is 50 to 55%. The left ventricle has low normal function. The left  ventricle demonstrates regional wall motion abnormalities. Definity contrast agent was given IV to delineate the left ventricular endocardial borders. The left ventricular internal cavity size was normal in size. There is moderate left ventricular hypertrophy. Left ventricular diastolic parameters are consistent with Grade II diastolic dysfunction (pseudonormalization). Elevated left ventricular end-diastolic pressure. Right Ventricle: The right ventricular size is normal. No increase in right ventricular wall thickness. Right ventricular systolic function is normal. Left Atrium: Left atrial size was mildly dilated. Right Atrium: Right atrial size was normal in size. Pericardium: There is no evidence of  pericardial effusion. Mitral Valve: The mitral valve is abnormal. There is mild thickening of the mitral valve leaflet(s). There is mild calcification of the mitral valve leaflet(s). Trivial  mitral valve regurgitation. No evidence of mitral valve stenosis. Tricuspid Valve: The tricuspid valve is normal in structure. Tricuspid valve regurgitation is not demonstrated. No evidence of tricuspid stenosis. Aortic Valve: The aortic valve is tricuspid. Aortic valve regurgitation is not visualized. Mild to moderate aortic valve sclerosis/calcification is present, without any evidence of aortic stenosis. Pulmonic Valve: The pulmonic valve was normal in structure. Pulmonic valve regurgitation is not visualized. No evidence of pulmonic stenosis. Aorta: The aortic root is normal in size and structure. Venous: The inferior vena cava is normal in size with greater than 50% respiratory variability, suggesting right atrial pressure of 3 mmHg. IAS/Shunts: No atrial level shunt detected by color flow Doppler.  LEFT VENTRICLE PLAX 2D LVIDd:         4.30 cm  Diastology LVIDs:         3.20 cm  LV e' medial:    4.46 cm/s LV PW:         1.60 cm  LV E/e' medial:  27.6 LV IVS:        1.60 cm  LV e' lateral:   4.13 cm/s LVOT diam:     1.80 cm  LV E/e' lateral: 29.8 LV SV:         65 LV SV Index:   31 LVOT Area:     2.54 cm  RIGHT VENTRICLE RV S prime:     8.16 cm/s TAPSE (M-mode): 1.0 cm LEFT ATRIUM             Index       RIGHT ATRIUM           Index LA diam:        4.40 cm 2.12 cm/m  RA Area:     15.90 cm LA Vol (A2C):   59.9 ml 28.84 ml/m RA Volume:   42.00 ml  20.22 ml/m LA Vol (A4C):   62.9 ml 30.28 ml/m LA Biplane Vol: 62.1 ml 29.90 ml/m  AORTIC VALVE LVOT Vmax:   135.00 cm/s LVOT Vmean:  91.900 cm/s LVOT VTI:    0.257 m  AORTA Ao Asc diam: 2.80 cm MITRAL VALVE MV Area (PHT): 2.49 cm     SHUNTS MV Decel Time: 305 msec     Systemic VTI:  0.26 m MV E velocity: 123.00 cm/s  Systemic Diam: 1.80 cm MV A velocity: 145.00 cm/s MV E/A ratio:  0.85 Jenkins Rouge MD Electronically signed by Jenkins Rouge MD Signature Date/Time: 06/15/2021/3:03:19 PM    Final     Microbiology: Recent Results (from the past 240 hour(s))  Resp  Panel by RT-PCR (Flu A&B, Covid) Nasopharyngeal Swab     Status: None   Collection Time: 06/14/21  3:19 AM   Specimen: Nasopharyngeal Swab; Nasopharyngeal(NP) swabs in vial transport medium  Result Value Ref Range Status   SARS Coronavirus 2 by RT PCR NEGATIVE NEGATIVE Final    Comment: (NOTE) SARS-CoV-2 target nucleic acids are NOT DETECTED.  The SARS-CoV-2 RNA is generally detectable in upper respiratory specimens during the acute phase of infection. The lowest concentration of SARS-CoV-2 viral copies this assay can detect is 138 copies/mL. A negative result does not preclude SARS-Cov-2 infection and should not be used as the sole basis for treatment or other patient management decisions. A negative result may occur with  improper specimen collection/handling, submission  of specimen other than nasopharyngeal swab, presence of viral mutation(s) within the areas targeted by this assay, and inadequate number of viral copies(<138 copies/mL). A negative result must be combined with clinical observations, patient history, and epidemiological information. The expected result is Negative.  Fact Sheet for Patients:  EntrepreneurPulse.com.au  Fact Sheet for Healthcare Providers:  IncredibleEmployment.be  This test is no t yet approved or cleared by the [place] FDA and  has been authorized for detection and/or diagnosis of SARS-CoV-2 by FDA under an Emergency Use Authorization (EUA). This EUA will remain  in effect (meaning this test can be used) for the duration of the COVID-19 declaration under Section 564(b)(1) of the Act, 21 U.S.C.section 360bbb-3(b)(1), unless the authorization is terminated  or revoked sooner.       Influenza A by PCR NEGATIVE NEGATIVE Final   Influenza B by PCR NEGATIVE NEGATIVE Final    Comment: (NOTE) The Xpert Xpress SARS-CoV-2/FLU/RSV plus assay is intended as an aid in the diagnosis of influenza from Nasopharyngeal  swab specimens and should not be used as a sole basis for treatment. Nasal washings and aspirates are unacceptable for Xpert Xpress SARS-CoV-2/FLU/RSV testing.  Fact Sheet for Patients: EntrepreneurPulse.com.au  Fact Sheet for Healthcare Providers: IncredibleEmployment.be  This test is not yet approved or cleared by the [place] FDA and has been authorized for detection and/or diagnosis of SARS-CoV-2 by FDA under an Emergency Use Authorization (EUA). This EUA will remain in effect (meaning this test can be used) for the duration of the COVID-19 declaration under Section 564(b)(1) of the Act, 21 U.S.C. section 360bbb-3(b)(1), unless the authorization is terminated or revoked.  Performed at [facility], [address], [place], [place] [address]      Labs: Basic Metabolic Panel: Recent Labs  Lab 06/13/21 2143 06/14/21 0444 06/14/21 2100 06/15/21 0454 06/16/21 0243 06/17/21 0228  NA 135 136  --  133* 135 135  K 3.7 4.3  --  3.6 4.2 3.6  CL 97* 98  --  100 100 102  CO2 22 25  --  26 24 23   GLUCOSE 341* 177*  --  161* 221* 155*  BUN 60* 63*  --  18 36* 15  CREATININE 7.36* 7.63* 8.20* 3.66* 5.40* 3.37*  CALCIUM 9.5 9.2  --  8.2* 8.9 8.8*  MG  --  2.2  --   --   --   --   PHOS  --  7.6*  --   --   --   --    Liver Function Tests: Recent Labs  Lab 06/14/21 0444  AST 58*  ALT 17  ALKPHOS 75  BILITOT 0.3  PROT 6.7  ALBUMIN 3.2*   No results for input(s): LIPASE, AMYLASE in the last 168 hours. No results for input(s): AMMONIA in the last 168 hours. CBC: Recent Labs  Lab 06/14/21 0444 06/14/21 2100 06/15/21 0454 06/16/21 0243 06/17/21 0228  WBC 12.3* 13.0* 11.5* 8.6 8.9  HGB 11.7* 10.9* 10.5* 10.1* 10.1*  HCT 37.1 34.2* 31.9* 32.2* 31.7*  MCV 105.7* 105.6* 103.9* 105.6* 105.0*  PLT 156 163 146* 161 167   Cardiac Enzymes: No results for input(s): CKTOTAL, CKMB, CKMBINDEX, TROPONINI in the last 168 hours. BNP: BNP  (last 3 results) Recent Labs    06/18/20 1549 05/28/21 1129  BNP 2,129.0* 205.0*    ProBNP (last 3 results) No results for input(s): PROBNP in the last 8760 hours.  CBG: Recent Labs  Lab 06/16/21 0626 06/16/21 1151 06/16/21 1845 06/16/21  2107 06/17/21 0748  GLUCAP 223* 284* 160* 191* 157*       Signed:  Domenic Polite MD.  [facility] 06/17/2021, 11:04 AM

## 2021-06-17 NOTE — Progress Notes (Signed)
[facility] Progress Note   Subjective:  Feeling better today, no CP/dyspnea/nausea. For d/c today per notes.  Objective Vitals:   06/16/21 1822 06/16/21 1836 06/16/21 2027 06/17/21 0435  BP: (!) 102/43 (!) 140/100 (!) 79/30 (!) 91/46  Pulse: 77  81 74  Resp: 16 16 13 18   Temp: 98.6 F (37 C)  98.1 F (36.7 C) 97.9 F (36.6 C)  TempSrc: Oral  Oral Oral  SpO2: 94%  92% 94%  Weight: 95.8 kg     Height:       Physical Exam General: Well appearing woman, NAD Heart: RRR; 2/6 murmur Lungs: CTAB Abdomen: soft, mildly distended Extremities: 1+ RLE edema, no LLE edema Dialysis Access: TDC + new RUE AVF (maturing)  Additional Objective Labs: Basic Metabolic Panel: Recent Labs  Lab 06/14/21 0444 06/14/21 2100 06/15/21 0454 06/16/21 0243 06/17/21 0228  NA 136  --  133* 135 135  K 4.3  --  3.6 4.2 3.6  CL 98  --  100 100 102  CO2 25  --  26 24 23   GLUCOSE 177*  --  161* 221* 155*  BUN 63*  --  18 36* 15  CREATININE 7.63*   < > 3.66* 5.40* 3.37*  CALCIUM 9.2  --  8.2* 8.9 8.8*  PHOS 7.6*  --   --   --   --    < > = values in this interval not displayed.   Liver Function Tests: Recent Labs  Lab 06/14/21 0444  AST 58*  ALT 17  ALKPHOS 75  BILITOT 0.3  PROT 6.7  ALBUMIN 3.2*   CBC: Recent Labs  Lab 06/14/21 0444 06/14/21 2100 06/15/21 0454 06/16/21 0243 06/17/21 0228  WBC 12.3* 13.0* 11.5* 8.6 8.9  HGB 11.7* 10.9* 10.5* 10.1* 10.1*  HCT 37.1 34.2* 31.9* 32.2* 31.7*  MCV 105.7* 105.6* 103.9* 105.6* 105.0*  PLT 156 163 146* 161 167   Studies/Results: ECHOCARDIOGRAM COMPLETE  Result Date: 06/15/2021    ECHOCARDIOGRAM REPORT   Patient Name:   Donna Howe Date of Exam: 06/15/2021 Medical Rec #:  [id number]        Height:       66.0 in Accession #:    [id number]       Weight:       218.7 lb Date of Birth:  1955/11/27       BSA:          2.077 m Patient Age:    65 years         BP:           98/27 mmHg Patient Gender: F                HR:            136 bpm. Exam Location:  Inpatient Procedure: 2D Echo, Color Doppler, Cardiac Doppler and Intracardiac            Opacification Agent Indications:    R07.9* Chest pain, unspecified  History:        Patient has prior history of Echocardiogram examinations, most                 recent 06/19/2020. CAD; Risk Factors:Hypertension and                 Dyslipidemia.  Sonographer:    Raquel Sarna Senior RDCS Referring Phys: [place]  1. Poor endocardial visualization even with contrast Appears to be some inferior basal  wall hypokineis on contrast images . Left ventricular ejection fraction, by estimation, is 50 to 55%. The left ventricle has low normal function. The left ventricle demonstrates regional wall motion abnormalities (see scoring diagram/findings for description). There is moderate left ventricular hypertrophy. Left ventricular diastolic parameters are consistent with Grade II diastolic dysfunction (pseudonormalization). Elevated left ventricular end-diastolic pressure.  2. Right ventricular systolic function is normal. The right ventricular size is normal.  3. Left atrial size was mildly dilated.  4. The mitral valve is abnormal. Trivial mitral valve regurgitation. No evidence of mitral stenosis.  5. The aortic valve is tricuspid. Aortic valve regurgitation is not visualized. Mild to moderate aortic valve sclerosis/calcification is present, without any evidence of aortic stenosis.  6. The inferior vena cava is normal in size with greater than 50% respiratory variability, suggesting right atrial pressure of 3 mmHg. FINDINGS  Left Ventricle: Poor endocardial visualization even with contrast Appears to be some inferior basal wall hypokineis on contrast images. Left ventricular ejection fraction, by estimation, is 50 to 55%. The left ventricle has low normal function. The left  ventricle demonstrates regional wall motion abnormalities. Definity contrast agent was given IV to delineate the  left ventricular endocardial borders. The left ventricular internal cavity size was normal in size. There is moderate left ventricular hypertrophy. Left ventricular diastolic parameters are consistent with Grade II diastolic dysfunction (pseudonormalization). Elevated left ventricular end-diastolic pressure. Right Ventricle: The right ventricular size is normal. No increase in right ventricular wall thickness. Right ventricular systolic function is normal. Left Atrium: Left atrial size was mildly dilated. Right Atrium: Right atrial size was normal in size. Pericardium: There is no evidence of pericardial effusion. Mitral Valve: The mitral valve is abnormal. There is mild thickening of the mitral valve leaflet(s). There is mild calcification of the mitral valve leaflet(s). Trivial mitral valve regurgitation. No evidence of mitral valve stenosis. Tricuspid Valve: The tricuspid valve is normal in structure. Tricuspid valve regurgitation is not demonstrated. No evidence of tricuspid stenosis. Aortic Valve: The aortic valve is tricuspid. Aortic valve regurgitation is not visualized. Mild to moderate aortic valve sclerosis/calcification is present, without any evidence of aortic stenosis. Pulmonic Valve: The pulmonic valve was normal in structure. Pulmonic valve regurgitation is not visualized. No evidence of pulmonic stenosis. Aorta: The aortic root is normal in size and structure. Venous: The inferior vena cava is normal in size with greater than 50% respiratory variability, suggesting right atrial pressure of 3 mmHg. IAS/Shunts: No atrial level shunt detected by color flow Doppler.  LEFT VENTRICLE PLAX 2D LVIDd:         4.30 cm  Diastology LVIDs:         3.20 cm  LV e' medial:    4.46 cm/s LV PW:         1.60 cm  LV E/e' medial:  27.6 LV IVS:        1.60 cm  LV e' lateral:   4.13 cm/s LVOT diam:     1.80 cm  LV E/e' lateral: 29.8 LV SV:         65 LV SV Index:   31 LVOT Area:     2.54 cm  RIGHT VENTRICLE RV S prime:      8.16 cm/s TAPSE (M-mode): 1.0 cm LEFT ATRIUM             Index       RIGHT ATRIUM           Index LA diam:  4.40 cm 2.12 cm/m  RA Area:     15.90 cm LA Vol (A2C):   59.9 ml 28.84 ml/m RA Volume:   42.00 ml  20.22 ml/m LA Vol (A4C):   62.9 ml 30.28 ml/m LA Biplane Vol: 62.1 ml 29.90 ml/m  AORTIC VALVE LVOT Vmax:   135.00 cm/s LVOT Vmean:  91.900 cm/s LVOT VTI:    0.257 m  AORTA Ao Asc diam: 2.80 cm MITRAL VALVE MV Area (PHT): 2.49 cm     SHUNTS MV Decel Time: 305 msec     Systemic VTI:  0.26 m MV E velocity: 123.00 cm/s  Systemic Diam: 1.80 cm MV A velocity: 145.00 cm/s MV E/A ratio:  0.85 Jenkins Rouge MD Electronically signed by Jenkins Rouge MD Signature Date/Time: 06/15/2021/3:03:19 PM    Final    Medications:  sodium chloride      [START ON 06/18/2021] allopurinol  100 mg Oral Q T,Th,Sa-HD   ALPRAZolam  0.25 mg Oral Daily   ALPRAZolam  0.5 mg Oral QHS   aspirin  81 mg Oral Daily   atorvastatin  80 mg Oral QPM   Chlorhexidine Gluconate Cloth  6 each Topical Daily   clopidogrel  75 mg Oral Q breakfast   ezetimibe  10 mg Oral Daily   gabapentin  400 mg Oral BID   heparin  5,000 Units Subcutaneous Q8H   insulin aspart  0-5 Units Subcutaneous QHS   insulin aspart  0-6 Units Subcutaneous TID WC   levothyroxine  112 mcg Oral QAC breakfast   midodrine  10 mg Oral BID WC   pantoprazole  40 mg Oral BID   polyethylene glycol  17 g Oral Daily   sodium chloride flush  3 mL Intravenous Q12H    Dialysis Orders: TTS at [facility] 4:15hr, 400/500, EDW 95kg, 2K/2.5Ca, TDC, heparin 2000 bolus - venofer 100 x 10 -- nearly complete - Mircera 73mcg Iv q 4 weeks - last 9/13 - Calcitriol 59mcg PO q HD   Assessment/Plan:  NSTEMI/severe CAD: Trop > 12K. Cards consulted and underwent LHC with balloon angioplasty to RCA graft. On Plavix/ASA.  ESRD: Usual TTS schedule - next tomorrow as outpatient.  Hypotension/volume: BP low - says always happens with HD, on mido 10mg  TID. CXR with mild  pulm edema. Will continue to gradually lower EDW - can be done as outpatient.  Anemia: Hgb 10.1 - no ESA needed for now.  Metabolic bone disease: Ca ok, Phos ^ - resume home binders. Dispo: Likely today.    Veneta Penton, PA-C 06/17/2021, 8:49 AM  [employer]

## 2021-06-17 NOTE — Progress Notes (Signed)
Contacted [facility] to advise them of pt's d/c today and to resume treatment tomorrow.   Melven Sartorius Renal Navigator [phone]

## 2021-06-17 NOTE — Care Management Important Message (Signed)
Important Message  Patient Details  Name: Donna Howe MRN: [id number] Date of Birth: May 15, 1956   Medicare Important Message Given:  Yes     Shelda Altes 06/17/2021, 9:57 AM

## 2021-06-18 DIAGNOSIS — D689 Coagulation defect, unspecified: Secondary | ICD-10-CM | POA: Diagnosis not present

## 2021-06-18 DIAGNOSIS — D509 Iron deficiency anemia, unspecified: Secondary | ICD-10-CM | POA: Diagnosis not present

## 2021-06-18 DIAGNOSIS — N186 End stage renal disease: Secondary | ICD-10-CM | POA: Diagnosis not present

## 2021-06-18 DIAGNOSIS — E1122 Type 2 diabetes mellitus with diabetic chronic kidney disease: Secondary | ICD-10-CM | POA: Diagnosis not present

## 2021-06-18 DIAGNOSIS — Z992 Dependence on renal dialysis: Secondary | ICD-10-CM | POA: Diagnosis not present

## 2021-06-18 DIAGNOSIS — N2581 Secondary hyperparathyroidism of renal origin: Secondary | ICD-10-CM | POA: Diagnosis not present

## 2021-06-19 ENCOUNTER — Telehealth: Payer: Self-pay | Admitting: Nephrology

## 2021-06-19 NOTE — Telephone Encounter (Signed)
Transition of care contact from inpatient facility  Date of discharge: 06/17/21 Date of contact: 06/19/21 Method: Phone Spoke to: Patient  Patient contacted to discuss transition of care from recent inpatient hospitalization. Patient was admitted to [facility] from 06/13/21-06/17/21.Marland Kitchen with discharge diagnosis of .NSTEMI  SP Angioplasty CAD.  Medication changes were reviewed. And dw using ufp 4 on HD to prevent cramps   Patient will follow up with his/her outpatient HD unit on: 06/21/21

## 2021-06-20 DIAGNOSIS — E083393 Diabetes mellitus due to underlying condition with moderate nonproliferative diabetic retinopathy without macular edema, bilateral: Secondary | ICD-10-CM | POA: Diagnosis not present

## 2021-06-21 DIAGNOSIS — Z992 Dependence on renal dialysis: Secondary | ICD-10-CM | POA: Diagnosis not present

## 2021-06-21 DIAGNOSIS — N186 End stage renal disease: Secondary | ICD-10-CM | POA: Diagnosis not present

## 2021-06-21 DIAGNOSIS — D509 Iron deficiency anemia, unspecified: Secondary | ICD-10-CM | POA: Diagnosis not present

## 2021-06-21 DIAGNOSIS — N2581 Secondary hyperparathyroidism of renal origin: Secondary | ICD-10-CM | POA: Diagnosis not present

## 2021-06-21 DIAGNOSIS — D689 Coagulation defect, unspecified: Secondary | ICD-10-CM | POA: Diagnosis not present

## 2021-06-23 DIAGNOSIS — N186 End stage renal disease: Secondary | ICD-10-CM | POA: Diagnosis not present

## 2021-06-23 DIAGNOSIS — N2581 Secondary hyperparathyroidism of renal origin: Secondary | ICD-10-CM | POA: Diagnosis not present

## 2021-06-23 DIAGNOSIS — Z992 Dependence on renal dialysis: Secondary | ICD-10-CM | POA: Diagnosis not present

## 2021-06-23 DIAGNOSIS — D689 Coagulation defect, unspecified: Secondary | ICD-10-CM | POA: Diagnosis not present

## 2021-06-23 DIAGNOSIS — D509 Iron deficiency anemia, unspecified: Secondary | ICD-10-CM | POA: Diagnosis not present

## 2021-06-25 DIAGNOSIS — N186 End stage renal disease: Secondary | ICD-10-CM | POA: Diagnosis not present

## 2021-06-25 DIAGNOSIS — D509 Iron deficiency anemia, unspecified: Secondary | ICD-10-CM | POA: Diagnosis not present

## 2021-06-25 DIAGNOSIS — N2581 Secondary hyperparathyroidism of renal origin: Secondary | ICD-10-CM | POA: Diagnosis not present

## 2021-06-25 DIAGNOSIS — D689 Coagulation defect, unspecified: Secondary | ICD-10-CM | POA: Diagnosis not present

## 2021-06-25 DIAGNOSIS — Z992 Dependence on renal dialysis: Secondary | ICD-10-CM | POA: Diagnosis not present

## 2021-06-27 ENCOUNTER — Encounter: Payer: Self-pay | Admitting: Family Medicine

## 2021-06-27 DIAGNOSIS — Z951 Presence of aortocoronary bypass graft: Secondary | ICD-10-CM | POA: Diagnosis not present

## 2021-06-27 DIAGNOSIS — Z23 Encounter for immunization: Secondary | ICD-10-CM | POA: Diagnosis not present

## 2021-06-27 DIAGNOSIS — Z955 Presence of coronary angioplasty implant and graft: Secondary | ICD-10-CM | POA: Diagnosis not present

## 2021-06-27 DIAGNOSIS — Z992 Dependence on renal dialysis: Secondary | ICD-10-CM | POA: Diagnosis not present

## 2021-06-27 DIAGNOSIS — I209 Angina pectoris, unspecified: Secondary | ICD-10-CM | POA: Diagnosis not present

## 2021-06-27 DIAGNOSIS — Z8679 Personal history of other diseases of the circulatory system: Secondary | ICD-10-CM | POA: Diagnosis not present

## 2021-06-28 DIAGNOSIS — Z992 Dependence on renal dialysis: Secondary | ICD-10-CM | POA: Diagnosis not present

## 2021-06-28 DIAGNOSIS — N2581 Secondary hyperparathyroidism of renal origin: Secondary | ICD-10-CM | POA: Diagnosis not present

## 2021-06-28 DIAGNOSIS — D509 Iron deficiency anemia, unspecified: Secondary | ICD-10-CM | POA: Diagnosis not present

## 2021-06-28 DIAGNOSIS — D689 Coagulation defect, unspecified: Secondary | ICD-10-CM | POA: Diagnosis not present

## 2021-06-28 DIAGNOSIS — N186 End stage renal disease: Secondary | ICD-10-CM | POA: Diagnosis not present

## 2021-06-30 DIAGNOSIS — D689 Coagulation defect, unspecified: Secondary | ICD-10-CM | POA: Diagnosis not present

## 2021-06-30 DIAGNOSIS — Z992 Dependence on renal dialysis: Secondary | ICD-10-CM | POA: Diagnosis not present

## 2021-06-30 DIAGNOSIS — N2581 Secondary hyperparathyroidism of renal origin: Secondary | ICD-10-CM | POA: Diagnosis not present

## 2021-06-30 DIAGNOSIS — D509 Iron deficiency anemia, unspecified: Secondary | ICD-10-CM | POA: Diagnosis not present

## 2021-06-30 DIAGNOSIS — N186 End stage renal disease: Secondary | ICD-10-CM | POA: Diagnosis not present

## 2021-06-30 NOTE — Progress Notes (Signed)
Cardiology Office Note  Date: 07/01/2021   ID: Donna Howe, DOB 06-17-1956, MRN [id number]  PCP:  Curlene Labrum, MD  Cardiologist:  Rozann Lesches, MD Electrophysiologist:  None   Chief Complaint: Hospital follow-up  History of Present Illness: Donna Howe is a 65 y.o. female with a history of CAD, carotid artery disease, ESRD, HTN, HLD, hypothyroidism, PAD, GERD, DM2, anemia  Recent presentation to [facility] emergency department on 06/13/2021 with complaint of chest discomfort.  This had been occurring for the prior 2 days and became worse on that evening.  She stated she felt as though there was something sitting on her chest and radiating to her left arm.  She denied any radiation but did feel somewhat short of breath.  She stated this was similar to what she experienced with her prior heart pain.  History of multiple stents since CABG.  Most recent catheterization March 07, 2021 with occlusion of most of her grafts and aggressive medical therapy was recommended.  Troponin was mildly elevated at 55, 1 delta troponin was greater than 500 concern for non-STEMI.  Third troponin was over 12,000.  She underwent cardiac catheterization on 06/14/2021.  See report below.  Catheterization revealed severe three-vessel CAD.  Culprit for the presentation was likely severe ISR in SVG to RCA.  She had known ISR of prior left main stent.  Had patent ostial LIMA stent with only 30% ISR.  She is here for hospital follow-up today.  She states she has some minor left chest pain which occasionally radiates to left arm.  She states she is unable to discern if chest pain is coming from her significant GERD or is related to her heart.  Blood pressure today is 90/60.  She has chronically low blood pressures and takes midodrine.  She has a twelve-lead EKG which she brought from her PCP office showing sinus rhythm with a rate of 71.  Moderate intraventricular conduction delay, ST deviation and moderate  T wave abnormality, consider anterolateral ischemia.  He has no active chest pain.  EKG appears compared to previous EKGs.  She denies any issues from her right groin catheter access site.    Past Medical History:  Diagnosis Date   Anemia    Anxiety    Asthma    CAD (coronary artery disease)    Multivessel s/p CABG 2005, numerous PCIs since that time and documented graft disease   Carotid artery disease ([place])    R CEA   ESRD on hemodialysis ([place])    Essential hypertension    Gout    History of blood transfusion    Hyperlipidemia    Hypothyroidism    Myocardial infarction [facility])    PAD (peripheral artery disease) ([place])    Dr. Kellie Simmering   Pneumonia 09/2019, 11/2019   S/P angioplasty with stent- DES to mRCA and to LIMA to LAD with DES 04/09/18.   04/10/2018   SBO (small bowel obstruction) ([place]) 2011   Status post lysis of adhesions & hernia repair   Sinus bradycardia    Type 2 diabetes mellitus ([place])    Umbilical hernia     Past Surgical History:  Procedure Laterality Date   AORTIC ARCH ANGIOGRAPHY N/A 08/02/2020   Procedure: AORTIC ARCH ANGIOGRAPHY;  Surgeon: Waynetta Sandy, MD;  Location: [place] CV LAB;  Service: Cardiovascular;  Laterality: N/A;  Lt upper extermity   AV FISTULA PLACEMENT Left 06/29/2020   Procedure: LEFT ARM ARTERIOVENOUS (AV) FISTULA;  Surgeon: Donzetta Matters,  Georgia Dom, MD;  Location: [place];  Service: Vascular;  Laterality: Left;  ARM   AV FISTULA PLACEMENT Right 05/11/2021   Procedure: RIGHT BRACHIOCEPHALIC ARTERIOVENOUS (AV) FISTULA CREATION;  Surgeon: Waynetta Sandy, MD;  Location: [facility] OR;  Service: Vascular;  Laterality: Right;   [place]  2010   CORONARY ARTERY BYPASS GRAFT  2005   CORONARY BALLOON ANGIOPLASTY N/A 05/31/2017   Procedure: CORONARY BALLOON ANGIOPLASTY;  Surgeon: Jettie Booze, MD;  Location: [place] CV LAB;  Service: Cardiovascular;  Laterality: N/A;   CORONARY BALLOON  ANGIOPLASTY N/A 06/14/2021   Procedure: CORONARY BALLOON ANGIOPLASTY;  Surgeon: Jettie Booze, MD;  Location: [place] CV LAB;  Service: Cardiovascular;  Laterality: N/A;   CORONARY STENT INTERVENTION N/A 05/31/2017   Procedure: CORONARY STENT INTERVENTION;  Surgeon: Jettie Booze, MD;  Location: [place] CV LAB;  Service: Cardiovascular;  Laterality: N/A;   CORONARY STENT INTERVENTION N/A 04/09/2018   Procedure: CORONARY STENT INTERVENTION;  Surgeon: Jettie Booze, MD;  Location: [place] CV LAB;  Service: Cardiovascular;  Laterality: N/A;  SVG RCA   CORONARY STENT INTERVENTION N/A 01/10/2021   Procedure: CORONARY STENT INTERVENTION;  Surgeon: Leonie Man, MD;  Location: [place] CV LAB;  Service: Cardiovascular;  Laterality: N/A;   CORONARY/GRAFT ACUTE MI REVASCULARIZATION N/A 02/24/2021   Procedure: Coronary/Graft Acute MI Revascularization;  Surgeon: Jettie Booze, MD;  Location: [place] CV LAB;  Service: Cardiovascular;  Laterality: N/A;   ENDARTERECTOMY Right 04/18/2013   Procedure: ENDARTERECTOMY CAROTID;  Surgeon: Mal Misty, MD;  Location: [place];  Service: Vascular;  Laterality: Right;   HERNIA REPAIR  1989   Incisional hernia repair x2  03/04/2010   Laparoscopic with 35cm mesh by Dr Ronnald Collum   IR [place] CV LINE RIGHT  06/21/2020   IR US GUIDE VASC ACCESS RIGHT  06/21/2020   LEFT HEART CATH AND CORS/GRAFTS ANGIOGRAPHY N/A 05/31/2017   Procedure: LEFT HEART CATH AND CORS/GRAFTS ANGIOGRAPHY;  Surgeon: Jettie Booze, MD;  Location: [place] CV LAB;  Service: Cardiovascular;  Laterality: N/A;   LEFT HEART CATH AND CORS/GRAFTS ANGIOGRAPHY N/A 04/08/2018   Procedure: LEFT HEART CATH AND CORS/GRAFTS ANGIOGRAPHY;  Surgeon: Jettie Booze, MD;  Location: [place] CV LAB;  Service: Cardiovascular;  Laterality: N/A;   LEFT HEART CATH AND CORS/GRAFTS ANGIOGRAPHY N/A 06/22/2020   Procedure: LEFT HEART CATH AND CORS/GRAFTS  ANGIOGRAPHY;  Surgeon: Belva Crome, MD;  Location: [place] CV LAB;  Service: Cardiovascular;  Laterality: N/A;   LEFT HEART CATH AND CORS/GRAFTS ANGIOGRAPHY N/A 01/10/2021   Procedure: LEFT HEART CATH AND CORS/GRAFTS ANGIOGRAPHY;  Surgeon: Leonie Man, MD;  Location: [place] CV LAB;  Service: Cardiovascular;  Laterality: N/A;   LEFT HEART CATH AND CORS/GRAFTS ANGIOGRAPHY N/A 02/24/2021   Procedure: LEFT HEART CATH AND CORS/GRAFTS ANGIOGRAPHY;  Surgeon: Jettie Booze, MD;  Location: [place] CV LAB;  Service: Cardiovascular;  Laterality: N/A;   LEFT HEART CATH AND CORS/GRAFTS ANGIOGRAPHY N/A 06/14/2021   Procedure: LEFT HEART CATH AND CORS/GRAFTS ANGIOGRAPHY;  Surgeon: Jettie Booze, MD;  Location: [place] CV LAB;  Service: Cardiovascular;  Laterality: N/A;   LEFT HEART CATHETERIZATION WITH CORONARY ANGIOGRAM N/A 12/19/2012   Procedure: LEFT HEART CATHETERIZATION WITH CORONARY ANGIOGRAM;  Surgeon: Josue Hector, MD;  Location: [facility] CATH LAB;  Service: Cardiovascular;  Laterality: N/A;   LEFT HEART CATHETERIZATION WITH CORONARY/GRAFT ANGIOGRAM N/A 04/19/2013   Procedure:  LEFT HEART CATHETERIZATION WITH Beatrix Fetters;  Surgeon: Lorretta Harp, MD;  Location: [facility] CATH LAB;  Service: Cardiovascular;  Laterality: N/A;   LIGATION OF ARTERIOVENOUS  FISTULA Left 09/15/2020   Procedure: LIGATION OF LEFT ARM ARTERIOVENOUS  FISTULA;  Surgeon: Waynetta Sandy, MD;  Location: [place];  Service: Vascular;  Laterality: Left;   PATCH ANGIOPLASTY Right 04/18/2013   Procedure: PATCH ANGIOPLASTY Right Internal Carotid Artery;  Surgeon: Mal Misty, MD;  Location: [place];  Service: Vascular;  Laterality: Right;   PERCUTANEOUS CORONARY STENT INTERVENTION (PCI-S) Right 12/19/2012   Procedure: PERCUTANEOUS CORONARY STENT INTERVENTION (PCI-S);  Surgeon: Josue Hector, MD;  Location: [facility] CATH LAB;  Service: Cardiovascular;  Laterality: Right;   PERIPHERAL VASCULAR INTERVENTION  Left 08/02/2020   Procedure: PERIPHERAL VASCULAR INTERVENTION;  Surgeon: Waynetta Sandy, MD;  Location: [place] CV LAB;  Service: Cardiovascular;  Laterality: Left;  Left subclavian   PERIPHERAL VASCULAR INTERVENTION  02/24/2021   Procedure: PERIPHERAL VASCULAR INTERVENTION;  Surgeon: Marty Heck, MD;  Location: [place] CV LAB;  Service: Vascular;;   SHOULDER SURGERY      Current Outpatient Medications  Medication Sig Dispense Refill   Albuterol Sulfate 108 (90 Base) MCG/ACT AEPB Inhale 2 puffs into the lungs every 6 (six) hours as needed (Shortness of breath).     allopurinol (ZYLOPRIM) 100 MG tablet Take one tablet after hemodialysis, on Tuesday, Thursday and Saturday. (Patient taking differently: Take 100 mg by mouth Every Tuesday,Thursday,and Saturday with dialysis. After dialysis) 30 tablet 11   ALPRAZolam (XANAX) 0.5 MG tablet Take 0.25-0.5 mg by mouth See admin instructions. Take 0.25 in the morning and evening and 0.5 mg at bedtime     aspirin EC 81 MG tablet Take 81 mg by mouth daily.      atorvastatin (LIPITOR) 80 MG tablet Take 1 tablet (80 mg total) by mouth every evening. (Patient taking differently: Take 80 mg by mouth at bedtime.)     clopidogrel (PLAVIX) 75 MG tablet Take 1 tablet (75 mg total) by mouth daily. 90 tablet 3   cyclobenzaprine (FLEXERIL) 5 MG tablet Take 1 tablet (5 mg total) by mouth 2 (two) times daily with a meal. 30 tablet 0   diclofenac Sodium (VOLTAREN) 1 % GEL Apply 2-4 g topically daily as needed (FOR NECK PAIN).     ezetimibe (ZETIA) 10 MG tablet Take 1 tablet (10 mg total) by mouth daily. (Patient taking differently: Take 10 mg by mouth at bedtime.) 30 tablet 11   gabapentin (NEURONTIN) 400 MG capsule Take 1 capsule (400 mg total) by mouth at bedtime.     ipratropium (ATROVENT HFA) 17 MCG/ACT inhaler Inhale 2 puffs into the lungs every 4 (four) hours as needed for wheezing.      levothyroxine (SYNTHROID) 112 MCG tablet Take 112 mcg  by mouth daily before breakfast.     midodrine (PROAMATINE) 10 MG tablet Take 1 tablet (10 mg total) by mouth 2 (two) times daily with a meal. 60 tablet 0   multivitamin (RENA-VIT) TABS tablet Take 1 tablet by mouth daily.     nitroGLYCERIN (NITROSTAT) 0.4 MG SL tablet Place 1 tablet (0.4 mg total) under the tongue every 5 (five) minutes x 3 doses as needed for chest pain. 100 tablet 3   NOVOLIN 70/30 KWIKPEN (70-30) 100 UNIT/ML KwikPen Inject 20 Units into the skin in the morning and at bedtime. (Patient taking differently: Inject 24 Units into the skin in the morning and at bedtime.)  ondansetron (ZOFRAN) 8 MG tablet Take 8 mg by mouth every 8 (eight) hours as needed for nausea.     ONETOUCH ULTRA test strip Check blood glucose THREE TIMES DAILY     pantoprazole (PROTONIX) 40 MG tablet Take 1 tablet (40 mg total) by mouth 2 (two) times daily. 60 tablet 1   ranolazine (RANEXA) 500 MG 12 hr tablet Take 1 tablet (500 mg total) by mouth at bedtime.     Vitamin D, Ergocalciferol, (DRISDOL) 1.25 MG (50000 UNIT) CAPS capsule Take 50,000 Units by mouth every Sunday.      No current facility-administered medications for this visit.   Allergies:  Penicillins and Beta adrenergic blockers   Social History: The patient  reports that she quit smoking about 8 years ago. Her smoking use included cigarettes. She has a 20.00 pack-year smoking history. She has never used smokeless tobacco. She reports that she does not drink alcohol and does not use drugs.   Family History: The patient's family history includes AAA (abdominal aortic aneurysm) in her father; Diabetes in her mother; Heart disease in her brother and mother; Hyperlipidemia in her mother and son; Hypertension in her brother, father, mother, and son; Thyroid disease in her father.   ROS:  Please see the history of present illness. Otherwise, complete review of systems is positive for none.  All other systems are reviewed and negative.   Physical  Exam: VS:  BP 90/60   Pulse 72   Ht _0  (1.651 m)   Wt 211 lb 3.2 oz (95.8 kg)   SpO2 93%   BMI 35.15 kg/m , BMI Body mass index is 35.15 kg/m.  Wt Readings from Last 3 Encounters:  07/01/21 211 lb 3.2 oz (95.8 kg)  06/16/21 211 lb 3.2 oz (95.8 kg)  06/01/21 214 lb 9.6 oz (97.3 kg)    General: Patient appears comfortable at rest. Neck: Supple, no elevated JVP or carotid bruits, no thyromegaly. Lungs: Clear to auscultation, nonlabored breathing at rest. Cardiac: Regular rate and rhythm, no S3 or significant systolic murmur, no pericardial rub. Extremities: No pitting edema, distal pulses 2+. Skin: Warm and dry.  Right groin catheter access site clean and dry with good pulse. Musculoskeletal: No kyphosis. Neuropsychiatric: Alert and oriented x3, affect grossly appropriate.  ECG:  EKG on 06/14/2021 normal sinus rhythm rate of 98, ST and T wave abnormality consider inferior and anterolateral ischemia, prolonged QT.  Recent Labwork: 05/28/2021: B Natriuretic Peptide 205.0 06/14/2021: ALT 17; AST 58; Magnesium 2.2 06/17/2021: BUN 15; Creatinine, Ser 3.37; Hemoglobin 10.1; Platelets 167; Potassium 3.6; Sodium 135     Component Value Date/Time   CHOL 168 02/23/2021 0446   TRIG 257 (H) 02/23/2021 0446   HDL 41 02/23/2021 0446   CHOLHDL 4.1 02/23/2021 0446   VLDL 51 (H) 02/23/2021 0446   LDLCALC 76 02/23/2021 0446    Other Studies Reviewed Today: Procedures  CORONARY BALLOON ANGIOPLASTY            06/14/2021  LEFT HEART CATH AND CORS/GRAFTS ANGIOGRAPHY   Conclusion      Ost LM to Dist LM lesion is 95% stenosed-severe restenosis   Prox LAD to Mid LAD lesion is 100% stenosed.   Ost LAD to Prox LAD lesion is 100% stenosed.  LIMA to LAD is patent.  Ostial LIMA stent with 30% in-stent restenosis.   Ost Cx to Mid Cx lesion is 99% stenosed.  SVG to OM/LPDA is occluded.   1st Mrg-1 lesion is 99% stenosed.  1st Mrg-2 lesion is 100% stenosed.   Origin to Prox Graft lesion is 30%  stenosed.   Prox RCA lesion is 100% stenosed.  Severe in-stent restenosis in the proximal graft.   Prox Graft-1 lesion is 90% stenosed.   Scoring balloon angioplasty was performed using a 3.5 mm core flex balloon.  This was followed with angioplasty by [facility] [place] 4.0X12.   Post intervention, there is a 0% residual stenosis.   LIMA and is normal in caliber.   LV end diastolic pressure is mildly elevated.   There is no aortic valve stenosis.   Severe three-vessel coronary artery disease.  Culprit for this presentation was likely the severe in-stent restenosis in the SVG to RCA.  Known in-stent restenosis of prior left main stent. Patent ostial LIMA stent with only 30% in-stent restenosis.     Mild volume overload.  The patient is due for dialysis.  Diagnostic Dominance: Co-dominant Intervention    Echocardiogram 06/15/2021 IMPRESSIONS   1. Poor endocardial visualization even with contrast Appears to be some  inferior basal wall hypokineis on contrast images . Left ventricular  ejection fraction, by estimation, is 50 to 55%. The left ventricle has low  normal function. The left ventricle  demonstrates regional wall motion abnormalities (see scoring  diagram/findings for description). There is moderate left ventricular  hypertrophy. Left ventricular diastolic parameters are consistent with  Grade II diastolic dysfunction  (pseudonormalization). Elevated left ventricular end-diastolic pressure.   2. Right ventricular systolic function is normal. The right ventricular  size is normal.   3. Left atrial size was mildly dilated.   4. The mitral valve is abnormal. Trivial mitral valve regurgitation. No  evidence of mitral stenosis.   5. The aortic valve is tricuspid. Aortic valve regurgitation is not  visualized. Mild to moderate aortic valve sclerosis/calcification is  present, without any evidence of aortic stenosis.   6. The inferior vena cava is normal in size with greater than  50%  respiratory variability, suggesting right atrial pressure of 3 mmHg.    PERIPHERAL VASCULAR INTERVENTION     02/24/2021   Conclusion  Patient name: Donna Howe          MRN: [id number]        DOB: [birth year]        Sex: female   02/24/2021 Pre-operative Diagnosis: In-stent stenosis in left subclavian stent in the setting of NSTEMI and limited flow to LIMA graft Post-operative diagnosis:  Same Surgeon:  Marty Heck, MD Procedure Performed: 1.  Left subclavian arteriogram  2.  Angioplasty of left subclavian stent (4 mm and 5 mm Sterling and 6 mm Mustang) 3.  Left subclavian stent using a covered stent (7 mm x 19 mm VBX)   Indications: 65 year old female that was admitted with chest pain and found to have an NSTEMI.  Further work-up was concerning for in-stent stenosis of her left subclavian stent in the setting of LIMA graft after previous CABG.  She had a left subclavian stent placed by Dr. Donzetta Matters last year.  I was called during the procedure to evaluate her left subclavian stent after it was noted there was a 30 mmHg pressure gradient across the stent during cardiac intervention by interventional cardiology.  I did get consent from her son over the phone given intraprocedure consult.   Findings: My initial plan was to realign her left subclavian stent with a covered stent.  Unfortunately I could not get an 035 balloon to cross the in-stent stenosis given  it was high-grade and ultimately had to cross the left subclavian stent with an 018 system and predilated this in-stent stenosis with a 4 mm and 5 mm Sterling.  I still could not get the VBX stent to cross so I then upsized to an 035 system using a Rosen wire in the descending thoracic aorta and finally predilated with a 6 mm Mustang.  Finally I was able to get the stent to track across the left subclavian in-stent stenosis and I realigned the previous stent with a 7 mm x 19 mm VBX.  I did make this stent a little more distal in the  subclavian artery given it appeared she had some disease in the subclavian distal to the previous stent but below the LIMA graft.  Final pullback pressure showed no significant gradient across the subclavian stent after restenting and there was a 30 mm Hg point gradient at the start of the case.              Procedure: I was called to lab 9 to evaluate her left subclavian stent and brachial access had been previously obtained by Dr. Irish Lack.  In evaluating the images, it did appear that there was a high-grade greater than 70 to 80% in-stent stenosis in the left subclavian stent and this had a pressure gradient of greater than 30 mmHg as measured by cardiology.  Once Dr. Irish Lack was finished with his portion of the procedure, I then used a versa core wire to exchange for a long 7 [place] destination sheath in the left brachial artery.  I advanced this all the way into the axillary artery.  Initially I planned to realign her existing subclavian stent which was bare-metal with a covered stent.  It became apparent that this was a very tight lesion and I could not even get an 035 catheter to track across the wire in the left subclavian stent and met resistance.  Ultimately initially tried to also cross a 4 mm Mustang balloon unsuccessful.  I then downsized to a V 18 wire and a smaller system and finally was able to get a 4 mm Sterling to cross and predilated the left subclavian in-stent stenosis with a 4 mm Sterling to nominal pressure for 2 minutes.  I then used a 5 mm Sterling to nominal pressure for 2 minutes.  That point time I tried to place a 7 mm VBX inside the existing stent but this would not track even after pre-dilation.  I then used a KMP catheter to cross the stent and exchanged for a stiffer Rosen wire down in the descending thoracic aorta.  Additional 3000 units of IV heparin was given we did check an ACT to maintain greater than 250.  At this point in time I advanced the sheath as far as I could into  the left subclavian artery but it would not track across the left subclavian stent.  I then elected to finally predilate th left subclavian stent with a 6 mm x 20 mm Mustang over the [place] system.  Finally I was able to get the VBX to track after dilation with a 6 mm baloon and I stented her proximal left subclavian making sure the stent was a little more distal given there appeared to be some calcified disease just distal to her previous stent.  This was deployed to nominal pressure inflating it to about 6.5 mm given her previous stent was 6 mm.  Final injection showed filling of the LIMA graft with no  evidence of residual stenosis.  I did put a KMP catheter across the stent in her aortic arch and did a pullback pressure and there was no residual gradient at completion in the left subclavian stent.  Wires and catheters were removed.  She was taken to holding to have the sheath removed.      Assessment and Plan:  1. NSTEMI (non-ST elevated myocardial infarction) ([place])   2. CAD in native artery   3. Hyperlipidemia LDL goal <70   4. ESRD on dialysis ([place])   5. Chronic hypotension     1. NSTEMI (non-ST elevated myocardial infarction) ([place]) Status post NSTEMI with scoring balloon angioplasty to proximal SVG RCA lesion with 0% stenosis status postintervention.  Currently has some mild occasional left chest pain which she states she is unable to discern if it is coming from her significant GERD or her heart disease.  She states she is unable to take her Ranexa very often except for days not scheduled for dialysis due to causing hypotension.  Right groin catheter access site with no redness, swelling, drainage or pain.  Good pulses.  Continue Ranexa 500 mg p.o. twice daily.  Continue sublingual nitroglycerin as needed, continue aspirin 81 mg daily, continue Plavix 75 mg p.o. daily.  2. CAD in native artery Significant native vessel and graft disease.  Recent NSTEMI with intervention to SVG to RCA lesion.   See cardiac cath report above.  3. Hyperlipidemia LDL goal <70 Continue atorvastatin 80 mg p.o. daily.  Continue Zetia 10 mg daily.  4.  ESRD on dialysis States she has been tolerating dialysis reasonably well.  Most recent creatinine 3.37 GFR 15.  5.  Chronic hypotension Blood pressure today 90/60.  She is asymptomatic.  She continues midodrine p.o. twice daily with meals  Medication Adjustments/Labs and Tests Ordered: Current medicines are reviewed at length with the patient today.  Concerns regarding medicines are outlined above.   Disposition: Follow-up with Dr. Domenic Polite or APP 6 months  Signed, Levell July, NP 07/01/2021 12:23 PM    [facility] [place], [place], [place] [address] Phone: [phone]; Fax: [phone]

## 2021-07-01 ENCOUNTER — Encounter: Payer: Self-pay | Admitting: Family Medicine

## 2021-07-01 ENCOUNTER — Other Ambulatory Visit: Payer: Self-pay

## 2021-07-01 ENCOUNTER — Ambulatory Visit (INDEPENDENT_AMBULATORY_CARE_PROVIDER_SITE_OTHER): Payer: Medicare HMO | Admitting: Family Medicine

## 2021-07-01 VITALS — BP 90/60 | HR 72 | Ht 65.0 in | Wt 211.2 lb

## 2021-07-01 DIAGNOSIS — N186 End stage renal disease: Secondary | ICD-10-CM

## 2021-07-01 DIAGNOSIS — E785 Hyperlipidemia, unspecified: Secondary | ICD-10-CM | POA: Diagnosis not present

## 2021-07-01 DIAGNOSIS — I9589 Other hypotension: Secondary | ICD-10-CM

## 2021-07-01 DIAGNOSIS — I2510 Atherosclerotic heart disease of native coronary artery without angina pectoris: Secondary | ICD-10-CM | POA: Diagnosis not present

## 2021-07-01 DIAGNOSIS — I214 Non-ST elevation (NSTEMI) myocardial infarction: Secondary | ICD-10-CM | POA: Diagnosis not present

## 2021-07-01 DIAGNOSIS — Z992 Dependence on renal dialysis: Secondary | ICD-10-CM | POA: Diagnosis not present

## 2021-07-01 NOTE — Patient Instructions (Signed)

## 2021-07-02 DIAGNOSIS — R110 Nausea: Secondary | ICD-10-CM | POA: Insufficient documentation

## 2021-07-04 DIAGNOSIS — N186 End stage renal disease: Secondary | ICD-10-CM | POA: Diagnosis not present

## 2021-07-04 DIAGNOSIS — D689 Coagulation defect, unspecified: Secondary | ICD-10-CM | POA: Diagnosis not present

## 2021-07-04 DIAGNOSIS — N2581 Secondary hyperparathyroidism of renal origin: Secondary | ICD-10-CM | POA: Diagnosis not present

## 2021-07-04 DIAGNOSIS — E039 Hypothyroidism, unspecified: Secondary | ICD-10-CM | POA: Diagnosis not present

## 2021-07-04 DIAGNOSIS — Z992 Dependence on renal dialysis: Secondary | ICD-10-CM | POA: Diagnosis not present

## 2021-07-04 DIAGNOSIS — E1122 Type 2 diabetes mellitus with diabetic chronic kidney disease: Secondary | ICD-10-CM | POA: Diagnosis not present

## 2021-07-05 DIAGNOSIS — D689 Coagulation defect, unspecified: Secondary | ICD-10-CM | POA: Diagnosis not present

## 2021-07-05 DIAGNOSIS — Z992 Dependence on renal dialysis: Secondary | ICD-10-CM | POA: Diagnosis not present

## 2021-07-05 DIAGNOSIS — E039 Hypothyroidism, unspecified: Secondary | ICD-10-CM | POA: Diagnosis not present

## 2021-07-05 DIAGNOSIS — E1122 Type 2 diabetes mellitus with diabetic chronic kidney disease: Secondary | ICD-10-CM | POA: Diagnosis not present

## 2021-07-05 DIAGNOSIS — N186 End stage renal disease: Secondary | ICD-10-CM | POA: Diagnosis not present

## 2021-07-05 DIAGNOSIS — N2581 Secondary hyperparathyroidism of renal origin: Secondary | ICD-10-CM | POA: Diagnosis not present

## 2021-07-06 ENCOUNTER — Encounter (HOSPITAL_COMMUNITY): Payer: Medicare HMO

## 2021-07-07 DIAGNOSIS — D689 Coagulation defect, unspecified: Secondary | ICD-10-CM | POA: Diagnosis not present

## 2021-07-07 DIAGNOSIS — E1122 Type 2 diabetes mellitus with diabetic chronic kidney disease: Secondary | ICD-10-CM | POA: Diagnosis not present

## 2021-07-07 DIAGNOSIS — E039 Hypothyroidism, unspecified: Secondary | ICD-10-CM | POA: Diagnosis not present

## 2021-07-07 DIAGNOSIS — N186 End stage renal disease: Secondary | ICD-10-CM | POA: Diagnosis not present

## 2021-07-07 DIAGNOSIS — N2581 Secondary hyperparathyroidism of renal origin: Secondary | ICD-10-CM | POA: Diagnosis not present

## 2021-07-07 DIAGNOSIS — Z992 Dependence on renal dialysis: Secondary | ICD-10-CM | POA: Diagnosis not present

## 2021-07-08 ENCOUNTER — Ambulatory Visit: Payer: Medicare HMO | Admitting: Student

## 2021-07-09 DIAGNOSIS — N186 End stage renal disease: Secondary | ICD-10-CM | POA: Diagnosis not present

## 2021-07-09 DIAGNOSIS — N2581 Secondary hyperparathyroidism of renal origin: Secondary | ICD-10-CM | POA: Diagnosis not present

## 2021-07-09 DIAGNOSIS — E1122 Type 2 diabetes mellitus with diabetic chronic kidney disease: Secondary | ICD-10-CM | POA: Diagnosis not present

## 2021-07-09 DIAGNOSIS — E039 Hypothyroidism, unspecified: Secondary | ICD-10-CM | POA: Diagnosis not present

## 2021-07-09 DIAGNOSIS — D689 Coagulation defect, unspecified: Secondary | ICD-10-CM | POA: Diagnosis not present

## 2021-07-09 DIAGNOSIS — Z992 Dependence on renal dialysis: Secondary | ICD-10-CM | POA: Diagnosis not present

## 2021-07-11 ENCOUNTER — Emergency Department (HOSPITAL_COMMUNITY): Payer: Medicare HMO

## 2021-07-11 ENCOUNTER — Other Ambulatory Visit: Payer: Self-pay

## 2021-07-11 ENCOUNTER — Emergency Department (HOSPITAL_COMMUNITY)
Admission: EM | Admit: 2021-07-11 | Discharge: 2021-07-11 | Disposition: A | Discharge status: Home / Self Care | Payer: Medicare HMO | Source: Home / Self Care | Attending: Emergency Medicine | Admitting: Emergency Medicine

## 2021-07-11 ENCOUNTER — Encounter (HOSPITAL_COMMUNITY): Payer: Self-pay | Admitting: *Deleted

## 2021-07-11 DIAGNOSIS — Z794 Long term (current) use of insulin: Secondary | ICD-10-CM | POA: Insufficient documentation

## 2021-07-11 DIAGNOSIS — Z79899 Other long term (current) drug therapy: Secondary | ICD-10-CM | POA: Insufficient documentation

## 2021-07-11 DIAGNOSIS — R059 Cough, unspecified: Secondary | ICD-10-CM | POA: Diagnosis not present

## 2021-07-11 DIAGNOSIS — I132 Hypertensive heart and chronic kidney disease with heart failure and with stage 5 chronic kidney disease, or end stage renal disease: Secondary | ICD-10-CM | POA: Insufficient documentation

## 2021-07-11 DIAGNOSIS — I5032 Chronic diastolic (congestive) heart failure: Secondary | ICD-10-CM | POA: Insufficient documentation

## 2021-07-11 DIAGNOSIS — J45909 Unspecified asthma, uncomplicated: Secondary | ICD-10-CM | POA: Insufficient documentation

## 2021-07-11 DIAGNOSIS — Z87891 Personal history of nicotine dependence: Secondary | ICD-10-CM | POA: Insufficient documentation

## 2021-07-11 DIAGNOSIS — Z20822 Contact with and (suspected) exposure to covid-19: Secondary | ICD-10-CM | POA: Insufficient documentation

## 2021-07-11 DIAGNOSIS — J440 Chronic obstructive pulmonary disease with acute lower respiratory infection: Secondary | ICD-10-CM | POA: Diagnosis not present

## 2021-07-11 DIAGNOSIS — E119 Type 2 diabetes mellitus without complications: Secondary | ICD-10-CM | POA: Insufficient documentation

## 2021-07-11 DIAGNOSIS — N186 End stage renal disease: Secondary | ICD-10-CM | POA: Insufficient documentation

## 2021-07-11 DIAGNOSIS — R0602 Shortness of breath: Secondary | ICD-10-CM | POA: Diagnosis not present

## 2021-07-11 DIAGNOSIS — J40 Bronchitis, not specified as acute or chronic: Secondary | ICD-10-CM | POA: Diagnosis not present

## 2021-07-11 DIAGNOSIS — J45901 Unspecified asthma with (acute) exacerbation: Secondary | ICD-10-CM | POA: Diagnosis not present

## 2021-07-11 DIAGNOSIS — Z7982 Long term (current) use of aspirin: Secondary | ICD-10-CM | POA: Insufficient documentation

## 2021-07-11 DIAGNOSIS — J209 Acute bronchitis, unspecified: Secondary | ICD-10-CM | POA: Insufficient documentation

## 2021-07-11 DIAGNOSIS — R079 Chest pain, unspecified: Secondary | ICD-10-CM | POA: Diagnosis not present

## 2021-07-11 DIAGNOSIS — J9600 Acute respiratory failure, unspecified whether with hypoxia or hypercapnia: Secondary | ICD-10-CM | POA: Diagnosis not present

## 2021-07-11 DIAGNOSIS — E039 Hypothyroidism, unspecified: Secondary | ICD-10-CM | POA: Insufficient documentation

## 2021-07-11 DIAGNOSIS — I2510 Atherosclerotic heart disease of native coronary artery without angina pectoris: Secondary | ICD-10-CM | POA: Insufficient documentation

## 2021-07-11 LAB — COMPREHENSIVE METABOLIC PANEL
ALT: 15 U/L (ref 0–44)
AST: 17 U/L (ref 15–41)
Albumin: 3.3 g/dL — ABNORMAL LOW (ref 3.5–5.0)
Alkaline Phosphatase: 81 U/L (ref 38–126)
Anion gap: 11 (ref 5–15)
BUN: 47 mg/dL — ABNORMAL HIGH (ref 8–23)
CO2: 27 mmol/L (ref 22–32)
Calcium: 9.4 mg/dL (ref 8.9–10.3)
Chloride: 97 mmol/L — ABNORMAL LOW (ref 98–111)
Creatinine, Ser: 6.38 mg/dL — ABNORMAL HIGH (ref 0.44–1.00)
GFR, Estimated: 7 mL/min — ABNORMAL LOW (ref >60)
Glucose, Bld: 134 mg/dL — ABNORMAL HIGH (ref 70–99)
Potassium: 4.4 mmol/L (ref 3.5–5.1)
Sodium: 135 mmol/L (ref 135–145)
Total Bilirubin: 0.6 mg/dL (ref 0.3–1.2)
Total Protein: 7 g/dL (ref 6.5–8.1)

## 2021-07-11 LAB — RESP PANEL BY RT-PCR (FLU A&B, COVID) ARPGX2
Influenza A by PCR: NEGATIVE
Influenza B by PCR: NEGATIVE
SARS Coronavirus 2 by RT PCR: NEGATIVE

## 2021-07-11 LAB — CBC WITH DIFFERENTIAL/PLATELET
Abs Immature Granulocytes: 0.12 10*3/uL — ABNORMAL HIGH (ref 0.00–0.07)
Basophils Absolute: 0.1 10*3/uL (ref 0.0–0.1)
Basophils Relative: 1 %
Eosinophils Absolute: 0.2 10*3/uL (ref 0.0–0.5)
Eosinophils Relative: 2 %
HCT: 37.4 % (ref 36.0–46.0)
Hemoglobin: 12.1 g/dL (ref 12.0–15.0)
Immature Granulocytes: 1 %
Lymphocytes Relative: 22 %
Lymphs Abs: 2.3 10*3/uL (ref 0.7–4.0)
MCH: 35 pg — ABNORMAL HIGH (ref 26.0–34.0)
MCHC: 32.4 g/dL (ref 30.0–36.0)
MCV: 108.1 fL — ABNORMAL HIGH (ref 80.0–100.0)
Monocytes Absolute: 1 10*3/uL (ref 0.1–1.0)
Monocytes Relative: 9 %
Neutro Abs: 6.9 10*3/uL (ref 1.7–7.7)
Neutrophils Relative %: 65 %
Platelets: 200 10*3/uL (ref 150–400)
RBC: 3.46 MIL/uL — ABNORMAL LOW (ref 3.87–5.11)
RDW: 16.3 % — ABNORMAL HIGH (ref 11.5–15.5)
WBC: 10.5 10*3/uL (ref 4.0–10.5)
nRBC: 0 % (ref 0.0–0.2)

## 2021-07-11 LAB — BRAIN NATRIURETIC PEPTIDE: B Natriuretic Peptide: 506 pg/mL — ABNORMAL HIGH (ref 0.0–100.0)

## 2021-07-11 LAB — MAGNESIUM: Magnesium: 2.3 mg/dL (ref 1.7–2.4)

## 2021-07-11 MED ORDER — ALBUTEROL (5 MG/ML) CONTINUOUS INHALATION SOLN
5.0000 mg/h | INHALATION_SOLUTION | Freq: Once | RESPIRATORY_TRACT | Status: AC
  Administered 2021-07-11: 5 mg/h via RESPIRATORY_TRACT

## 2021-07-11 MED ORDER — DOXYCYCLINE HYCLATE 100 MG PO CAPS: 100.0000 mg | ORAL_CAPSULE | Freq: Two times a day (BID) | ORAL | 0 refills | Status: DC

## 2021-07-11 MED ORDER — PREDNISONE 10 MG PO TABS: 30.0000 mg | ORAL_TABLET | Freq: Every day | ORAL | 0 refills | Status: DC

## 2021-07-11 MED ORDER — ALBUTEROL SULFATE (2.5 MG/3ML) 0.083% IN NEBU
INHALATION_SOLUTION | RESPIRATORY_TRACT | Status: AC
  Filled 2021-07-11: qty 6

## 2021-07-11 MED ORDER — METHYLPREDNISOLONE SODIUM SUCC 125 MG IJ SOLR: 125.0000 mg | Freq: Once | INTRAMUSCULAR | Status: DC

## 2021-07-11 MED ORDER — ALBUTEROL SULFATE (2.5 MG/3ML) 0.083% IN NEBU
2.5000 mg | INHALATION_SOLUTION | Freq: Once | RESPIRATORY_TRACT | Status: AC
  Administered 2021-07-11: 2.5 mg via RESPIRATORY_TRACT
  Filled 2021-07-11: qty 3

## 2021-07-11 NOTE — ED Triage Notes (Signed)
Pt c/o coughing, runny nose, SOB and pain to back of her chest.  Spoke with PCP on Wednesday and called back to today, instructed to come to ER.

## 2021-07-11 NOTE — ED Provider Notes (Signed)
[facility] EMERGENCY DEPARTMENT Provider Note   CSN: [id number] Arrival date & time: 07/11/21  1200     History Chief Complaint  Patient presents with   Cough    Donna Howe is a 65 y.o. female.  HPI  Patient with significant medical history including CAD, asthma, recent NSTEMI with balloon angioplasty hemodialysis Tuesday Thursday Saturday chronic hypertension presents with chief complaint of shortness of breath and a cough.  Patient states this started last Wednesday, she states that she developed some nasal congestion and a productive cough, she states she has been coughing more frequently, she states when she coughs she will feel some tightness in her chest and will feel slightly short of breath.  She denies worsening dyspnea on exertion, orthopnea, worsening pedal edema, she denies becoming diaphoretic, associated nausea, vomiting, lightheaded or dizziness.  She denies any subjective fevers or chills, denies stomach pains, nausea, vomit, diarrhea, general body aches, she denies recent sick contacts.  She is up-to-date on her COVID and her influenza vaccines.  She states that she contacted her PCP and try to obtain antibiotics as she frequently gets bronchitis frequently into pneumonia.  They are unable to see her and that is why she is here today.  Past Medical History:  Diagnosis Date   Anemia    Anxiety    Asthma    CAD (coronary artery disease)    Multivessel s/p CABG 2005, numerous PCIs since that time and documented graft disease   Carotid artery disease ([place])    R CEA   ESRD on hemodialysis ([place])    Essential hypertension    Gout    History of blood transfusion    Hyperlipidemia    Hypothyroidism    Myocardial infarction [facility])    PAD (peripheral artery disease) ([place])    Dr. Kellie Simmering   Pneumonia 09/2019, 11/2019   S/P angioplasty with stent- DES to mRCA and to LIMA to LAD with DES 04/09/18.   04/10/2018   SBO (small bowel obstruction) ([place]) 2011   Status post lysis  of adhesions & hernia repair   Sinus bradycardia    Type 2 diabetes mellitus ([place])    Umbilical hernia     Patient Active Problem List   Diagnosis Date Noted   Pulmonary edema 06/14/2021   Stenosis of left subclavian artery (HCC)    Leukocytosis 01/09/2021   Elevated MCV 01/09/2021   Diabetic neuropathy ([place]) 01/09/2021   Unspecified protein-calorie malnutrition ([place]) 07/05/2020   Allergy, unspecified, initial encounter 06/30/2020   Anaphylactic shock, unspecified, initial encounter 06/30/2020   Anemia in chronic kidney disease 06/30/2020   Coagulation defect, unspecified ([place]) 06/30/2020   Diarrhea, unspecified 06/30/2020   Encounter for immunization 06/30/2020   Iron deficiency anemia, unspecified 06/30/2020   Pain, unspecified 06/30/2020   PAD (peripheral artery disease) ([place]) 06/30/2020   Pruritus, unspecified 06/30/2020   Secondary hyperparathyroidism of renal origin ([facility]) 06/30/2020   ESRD on dialysis (HCC)    SOB (shortness of breath)    Chronic diastolic HF (heart failure) (HCC)    GERD (gastroesophageal reflux disease)    Acute renal failure superimposed on stage 5 chronic kidney disease, not on chronic dialysis ([place]) 06/13/2020   Hyperglycemia due to diabetes mellitus ([place]) 06/13/2020   Pneumonia 12/17/2019   Cough variant asthma with component of UACS 11/20/2019   Elevated troponin I level 10/02/2019   Chest pain 10/01/2019   Unstable angina ([place]) 04/18/2018   Hypothyroidism (acquired) 04/18/2018   S/P angioplasty with stent- DES to  mRCA and to LIMA to LAD with DES 04/09/18.   04/10/2018   Angina pectoris ([place]) 04/05/2018   Status post coronary artery stent placement    Acute coronary syndrome ([place]) 05/31/2017   Acute chest pain    NSTEMI (non-ST elevated myocardial infarction) ([place])    History of coronary artery disease    Diabetes mellitus with ESRD (end-stage renal disease) ([place]) 10/28/2013   Hypoglycemia associated with diabetes ([place]) 10/28/2013    Thrombocytopenia, unspecified ([place]) 10/28/2013   Hypokalemia 10/27/2013   Syncope 10/26/2013   Anemia 10/26/2013   AKI (acute kidney injury) ([place]) 10/26/2013   Fracture of toe of right foot 83/15/1761   Umbilical hernia    Carotid artery disease ([place])    Occlusion and stenosis of carotid artery without mention of cerebral infarction 04/15/2013   Hx of CABG    Ejection fraction    Atherosclerosis of native artery of extremity with intermittent claudication ([place]) 02/11/2013   Diabetes mellitus with renal manifestation ([place]) 01/21/2013   Bradycardia 01/03/2013   Chronic kidney disease (CKD), stage III (moderate) (HCC)    Gout    PROTEINURIA, MILD 01/18/2010   Obesity (BMI 30-39.9) 08/26/2009   Asthma 08/26/2009   Mixed hyperlipidemia 03/31/2007   Essential hypertension 03/31/2007   CAD (coronary artery disease) 03/31/2007    Past Surgical History:  Procedure Laterality Date   AORTIC ARCH ANGIOGRAPHY N/A 08/02/2020   Procedure: AORTIC ARCH ANGIOGRAPHY;  Surgeon: Waynetta Sandy, MD;  Location: [place] CV LAB;  Service: Cardiovascular;  Laterality: N/A;  Lt upper extermity   AV FISTULA PLACEMENT Left 06/29/2020   Procedure: LEFT ARM ARTERIOVENOUS (AV) FISTULA;  Surgeon: Waynetta Sandy, MD;  Location: [place];  Service: Vascular;  Laterality: Left;  ARM   AV FISTULA PLACEMENT Right 05/11/2021   Procedure: RIGHT BRACHIOCEPHALIC ARTERIOVENOUS (AV) FISTULA CREATION;  Surgeon: Waynetta Sandy, MD;  Location: [facility] OR;  Service: Vascular;  Laterality: Right;   [place]  2010   CORONARY ARTERY BYPASS GRAFT  2005   CORONARY BALLOON ANGIOPLASTY N/A 05/31/2017   Procedure: CORONARY BALLOON ANGIOPLASTY;  Surgeon: Jettie Booze, MD;  Location: [place] CV LAB;  Service: Cardiovascular;  Laterality: N/A;   CORONARY BALLOON ANGIOPLASTY N/A 06/14/2021   Procedure: CORONARY BALLOON ANGIOPLASTY;  Surgeon: Jettie Booze, MD;   Location: [place] CV LAB;  Service: Cardiovascular;  Laterality: N/A;   CORONARY STENT INTERVENTION N/A 05/31/2017   Procedure: CORONARY STENT INTERVENTION;  Surgeon: Jettie Booze, MD;  Location: [place] CV LAB;  Service: Cardiovascular;  Laterality: N/A;   CORONARY STENT INTERVENTION N/A 04/09/2018   Procedure: CORONARY STENT INTERVENTION;  Surgeon: Jettie Booze, MD;  Location: [place] CV LAB;  Service: Cardiovascular;  Laterality: N/A;  SVG RCA   CORONARY STENT INTERVENTION N/A 01/10/2021   Procedure: CORONARY STENT INTERVENTION;  Surgeon: Leonie Man, MD;  Location: [place] CV LAB;  Service: Cardiovascular;  Laterality: N/A;   CORONARY/GRAFT ACUTE MI REVASCULARIZATION N/A 02/24/2021   Procedure: Coronary/Graft Acute MI Revascularization;  Surgeon: Jettie Booze, MD;  Location: [place] CV LAB;  Service: Cardiovascular;  Laterality: N/A;   ENDARTERECTOMY Right 04/18/2013   Procedure: ENDARTERECTOMY CAROTID;  Surgeon: Mal Misty, MD;  Location: [place];  Service: Vascular;  Laterality: Right;   HERNIA REPAIR  1989   Incisional hernia repair x2  03/04/2010   Laparoscopic with 35cm mesh by Dr Ronnald Collum   IR FLUORO GUIDE CV LINE  RIGHT  06/21/2020   IR US GUIDE VASC ACCESS RIGHT  06/21/2020   LEFT HEART CATH AND CORS/GRAFTS ANGIOGRAPHY N/A 05/31/2017   Procedure: LEFT HEART CATH AND CORS/GRAFTS ANGIOGRAPHY;  Surgeon: Jettie Booze, MD;  Location: [place] CV LAB;  Service: Cardiovascular;  Laterality: N/A;   LEFT HEART CATH AND CORS/GRAFTS ANGIOGRAPHY N/A 04/08/2018   Procedure: LEFT HEART CATH AND CORS/GRAFTS ANGIOGRAPHY;  Surgeon: Jettie Booze, MD;  Location: [place] CV LAB;  Service: Cardiovascular;  Laterality: N/A;   LEFT HEART CATH AND CORS/GRAFTS ANGIOGRAPHY N/A 06/22/2020   Procedure: LEFT HEART CATH AND CORS/GRAFTS ANGIOGRAPHY;  Surgeon: Belva Crome, MD;  Location: [place] CV LAB;  Service: Cardiovascular;  Laterality: N/A;    LEFT HEART CATH AND CORS/GRAFTS ANGIOGRAPHY N/A 01/10/2021   Procedure: LEFT HEART CATH AND CORS/GRAFTS ANGIOGRAPHY;  Surgeon: Leonie Man, MD;  Location: [place] CV LAB;  Service: Cardiovascular;  Laterality: N/A;   LEFT HEART CATH AND CORS/GRAFTS ANGIOGRAPHY N/A 02/24/2021   Procedure: LEFT HEART CATH AND CORS/GRAFTS ANGIOGRAPHY;  Surgeon: Jettie Booze, MD;  Location: [place] CV LAB;  Service: Cardiovascular;  Laterality: N/A;   LEFT HEART CATH AND CORS/GRAFTS ANGIOGRAPHY N/A 06/14/2021   Procedure: LEFT HEART CATH AND CORS/GRAFTS ANGIOGRAPHY;  Surgeon: Jettie Booze, MD;  Location: [place] CV LAB;  Service: Cardiovascular;  Laterality: N/A;   LEFT HEART CATHETERIZATION WITH CORONARY ANGIOGRAM N/A 12/19/2012   Procedure: LEFT HEART CATHETERIZATION WITH CORONARY ANGIOGRAM;  Surgeon: Josue Hector, MD;  Location: [facility] CATH LAB;  Service: Cardiovascular;  Laterality: N/A;   LEFT HEART CATHETERIZATION WITH CORONARY/GRAFT ANGIOGRAM N/A 04/19/2013   Procedure: LEFT HEART CATHETERIZATION WITH Beatrix Fetters;  Surgeon: Lorretta Harp, MD;  Location: [facility] CATH LAB;  Service: Cardiovascular;  Laterality: N/A;   LIGATION OF ARTERIOVENOUS  FISTULA Left 09/15/2020   Procedure: LIGATION OF LEFT ARM ARTERIOVENOUS  FISTULA;  Surgeon: Waynetta Sandy, MD;  Location: [place];  Service: Vascular;  Laterality: Left;   PATCH ANGIOPLASTY Right 04/18/2013   Procedure: PATCH ANGIOPLASTY Right Internal Carotid Artery;  Surgeon: Mal Misty, MD;  Location: [place];  Service: Vascular;  Laterality: Right;   PERCUTANEOUS CORONARY STENT INTERVENTION (PCI-S) Right 12/19/2012   Procedure: PERCUTANEOUS CORONARY STENT INTERVENTION (PCI-S);  Surgeon: Josue Hector, MD;  Location: [facility] CATH LAB;  Service: Cardiovascular;  Laterality: Right;   PERIPHERAL VASCULAR INTERVENTION Left 08/02/2020   Procedure: PERIPHERAL VASCULAR INTERVENTION;  Surgeon: Waynetta Sandy, MD;  Location: [place] CV LAB;  Service: Cardiovascular;  Laterality: Left;  Left subclavian   PERIPHERAL VASCULAR INTERVENTION  02/24/2021   Procedure: PERIPHERAL VASCULAR INTERVENTION;  Surgeon: Marty Heck, MD;  Location: [place] CV LAB;  Service: Vascular;;   SHOULDER SURGERY       OB History   No obstetric history on file.     Family History  Problem Relation Age of Onset   Diabetes Mother    Heart disease Mother        before age 48   Hyperlipidemia Mother    Hypertension Mother    Thyroid disease Father    Hypertension Father    AAA (abdominal aortic aneurysm) Father    Heart disease Brother        before age 68   Hypertension Brother    Hyperlipidemia Son    Hypertension Son     Social History   Tobacco Use   Smoking status: Former    Packs/day: 1.00  Years: 20.00    Pack years: 20.00    Types: Cigarettes    Quit date: 12/10/2012    Years since quitting: 8.5   Smokeless tobacco: Never  Vaping Use   Vaping Use: Never used  Substance Use Topics   Alcohol use: No    Alcohol/week: 0.0 standard drinks   Drug use: No    Home Medications Prior to Admission medications   Medication Sig Start Date End Date Taking? Authorizing Provider  Albuterol Sulfate 108 (90 Base) MCG/ACT AEPB Inhale 2 puffs into the lungs every 6 (six) hours as needed (Shortness of breath).    [provider]  allopurinol (ZYLOPRIM) 100 MG tablet Take one tablet after hemodialysis, on Tuesday, Thursday and Saturday. Patient taking differently: Take 100 mg by mouth Every Tuesday,Thursday,and Saturday with dialysis. After dialysis 06/30/20   Arrien, Jimmy Picket, MD  ALPRAZolam Duanne Moron) 0.5 MG tablet Take 0.25-0.5 mg by mouth See admin instructions. Take 0.25 in the morning and evening and 0.5 mg at bedtime    [provider]  aspirin EC 81 MG tablet Take 81 mg by mouth daily.     [provider]  atorvastatin (LIPITOR) 80 MG tablet Take 1 tablet (80 mg total) by  mouth every evening. Patient taking differently: Take 80 mg by mouth at bedtime. 12/19/19   Johnson, Clanford L, MD  clopidogrel (PLAVIX) 75 MG tablet Take 1 tablet (75 mg total) by mouth daily. 08/27/18   Satira Sark, MD  cyclobenzaprine (FLEXERIL) 5 MG tablet Take 1 tablet (5 mg total) by mouth 2 (two) times daily with a meal. 06/17/21   Domenic Polite, MD  diclofenac Sodium (VOLTAREN) 1 % GEL Apply 2-4 g topically daily as needed (FOR NECK PAIN).    [provider]  doxycycline (VIBRAMYCIN) 100 MG capsule Take 1 capsule (100 mg total) by mouth 2 (two) times daily. 07/11/21   Marcello Fennel, PA-C  ezetimibe (ZETIA) 10 MG tablet Take 1 tablet (10 mg total) by mouth daily. Patient taking differently: Take 10 mg by mouth at bedtime. 02/25/21   Bhagat, Crista Luria, PA  gabapentin (NEURONTIN) 400 MG capsule Take 1 capsule (400 mg total) by mouth at bedtime. 06/17/21   Domenic Polite, MD  ipratropium (ATROVENT HFA) 17 MCG/ACT inhaler Inhale 2 puffs into the lungs every 4 (four) hours as needed for wheezing.     [provider]  levothyroxine (SYNTHROID) 112 MCG tablet Take 112 mcg by mouth daily before breakfast.    [provider]  midodrine (PROAMATINE) 10 MG tablet Take 1 tablet (10 mg total) by mouth 2 (two) times daily with a meal. 06/17/21   Domenic Polite, MD  multivitamin (RENA-VIT) TABS tablet Take 1 tablet by mouth daily.    [provider]  nitroGLYCERIN (NITROSTAT) 0.4 MG SL tablet Place 1 tablet (0.4 mg total) under the tongue every 5 (five) minutes x 3 doses as needed for chest pain. 02/25/21   Bhagat, Bhavinkumar, PA  NOVOLIN 70/30 KWIKPEN (70-30) 100 UNIT/ML KwikPen Inject 20 Units into the skin in the morning and at bedtime. Patient taking differently: Inject 24 Units into the skin in the morning and at bedtime. 06/17/21   Domenic Polite, MD  ondansetron (ZOFRAN) 8 MG tablet Take 8 mg by mouth every 8 (eight) hours as needed for nausea.  07/12/20   [provider]  Donald Siva test strip Check blood glucose THREE TIMES DAILY 03/31/21   [provider]  pantoprazole (PROTONIX) 40 MG tablet Take  1 tablet (40 mg total) by mouth 2 (two) times daily. 06/17/20   Barton Dubois, MD  predniSONE (DELTASONE) 10 MG tablet Take 3 tablets (30 mg total) by mouth daily for 5 days. 07/11/21 07/16/21  Marcello Fennel, PA-C  ranolazine (RANEXA) 500 MG 12 hr tablet Take 1 tablet (500 mg total) by mouth at bedtime. 06/17/21   Domenic Polite, MD  Vitamin D, Ergocalciferol, (DRISDOL) 1.25 MG (50000 UNIT) CAPS capsule Take 50,000 Units by mouth every Sunday.  12/31/19   [provider]    Allergies    Penicillins and Beta adrenergic blockers  Review of Systems   Review of Systems  Constitutional:  Negative for chills and fever.  HENT:  Negative for congestion.   Respiratory:  Positive for cough and chest tightness. Negative for shortness of breath.   Cardiovascular:  Negative for chest pain, palpitations and leg swelling.  Gastrointestinal:  Negative for abdominal pain, diarrhea, nausea and vomiting.  Genitourinary:  Negative for enuresis.  Musculoskeletal:  Negative for back pain.  Skin:  Negative for rash.  Neurological:  Negative for dizziness and headaches.  Hematological:  Does not bruise/bleed easily.   Physical Exam Updated Vital Signs BP 126/80 (BP Location: Left Arm)   Pulse 85   Temp 98.1 F (36.7 C) (Oral)   Resp 18   Ht 5\' 5"  (1.651 m)   Wt 95.3 kg   SpO2 95%   BMI 34.95 kg/m   Physical Exam Vitals and nursing note reviewed.  Constitutional:      General: She is not in acute distress.    Appearance: She is not ill-appearing.  HENT:     Head: Normocephalic and atraumatic.     Nose: No congestion.     Mouth/Throat:     Mouth: Mucous membranes are moist.     Pharynx: Oropharynx is clear.  Eyes:     Conjunctiva/sclera: Conjunctivae normal.  Cardiovascular:     Rate and Rhythm: Normal  rate and regular rhythm.     Pulses: Normal pulses.     Heart sounds: No murmur heard.   No friction rub. No gallop.  Pulmonary:     Effort: No respiratory distress.     Breath sounds: Wheezing present. No rhonchi or rales.     Comments: Patient is not in respiratory distress, able to speak in full sentences, she was nontachypneic, nonhypoxic, satting in the mid 90s.  She had a tight sounding chest, wheezing heard bilaterally, no rales or rhonchi present. Abdominal:     Palpations: Abdomen is soft.     Tenderness: There is no abdominal tenderness. There is no right CVA tenderness or left CVA tenderness.  Musculoskeletal:     Right lower leg: No edema.     Left lower leg: No edema.  Skin:    General: Skin is warm and dry.  Neurological:     Mental Status: She is alert.  Psychiatric:        Mood and Affect: Mood normal.    ED Results / Procedures / Treatments   Labs (all labs ordered are listed, but only abnormal results are displayed) Labs Reviewed  COMPREHENSIVE METABOLIC PANEL - Abnormal; Notable for the following components:      Result Value   Chloride 97 (*)    Glucose, Bld 134 (*)    BUN 47 (*)    Creatinine, Ser 6.38 (*)    Albumin 3.3 (*)    GFR, Estimated 7 (*)    All other components  within normal limits  BRAIN NATRIURETIC PEPTIDE - Abnormal; Notable for the following components:   B Natriuretic Peptide 506.0 (*)    All other components within normal limits  CBC WITH DIFFERENTIAL/PLATELET - Abnormal; Notable for the following components:   RBC 3.46 (*)    MCV 108.1 (*)    MCH 35.0 (*)    RDW 16.3 (*)    Abs Immature Granulocytes 0.12 (*)    All other components within normal limits  RESP PANEL BY RT-PCR (FLU A&B, COVID) [id number]  MAGNESIUM    EKG EKG Interpretation  Date/Time:  Monday July 11 2021 13:10:05 EDT Ventricular Rate:  83 PR Interval:  193 QRS Duration: 115 QT Interval:  374 QTC Calculation: 440 R Axis:   74 Text Interpretation: Sinus  rhythm Nonspecific intraventricular conduction delay Nonspecific repol abnormality, diffuse leads similar to earlier in the day and Sept 2022 Confirmed by Sherwood Gambler [phone]) on 07/11/2021 2:00:02 PM  Radiology DG Chest Portable 1 View  Result Date: 07/11/2021 CLINICAL DATA:  Upper back pain,SOB, cough EXAM: PORTABLE CHEST 1 VIEW COMPARISON:  06/13/2021 FINDINGS: Right IJ approach central line is again noted. No new consolidation or edema. No pleural effusion. Stable cardiomediastinal contours. Left subclavian origin stent. IMPRESSION: No acute process in the chest. Electronically Signed   By: Macy Mis M.D.   On: 07/11/2021 13:25    Procedures Procedures   Medications Ordered in ED Medications  methylPREDNISolone sodium succinate (SOLU-MEDROL) 125 mg/2 mL injection 125 mg (125 mg Intravenous Patient Refused/Not Given 07/11/21 1800)  albuterol (PROVENTIL) (2.5 MG/3ML) 0.083% nebulizer solution (  Canceled Entry 07/11/21 1511)  albuterol (PROVENTIL) (2.5 MG/3ML) 0.083% nebulizer solution 2.5 mg (2.5 mg Nebulization Given 07/11/21 1358)  albuterol (PROVENTIL,VENTOLIN) solution continuous neb (5 mg/hr Nebulization Given 07/11/21 1511)    ED Course  I have reviewed the triage vital signs and the nursing notes.  Pertinent labs & imaging results that were available during my care of the patient were reviewed by me and considered in my medical decision making (see chart for details).    MDM Rules/Calculators/A&P                          Initial impression-patient presents with cough and chest tightness.  She is alert, does not appear acute stress, vital signs are reassuring.  Concern for possible pneumonia versus COPD exacerbation will obtain basic lab work-up, and reassess.  Work-up-CBC is unremarkable, CMP shows a chloride of 97, glucose 134, BUN of 47, creatinine 6.3, albumin 3.3, magnesium 2.3, respiratory panel negative, BNP slightly elevated 509.  Chest x-ray negative for acute  findings.  EKG sinus rhythm without signs of ischemia.  Reassessment-patient was reassessed after nebulizing treatment, her lung sounds have improved but she still has wheezing present  She is not tachypneic, not requiring any oxygen's, she is still able to speak in full sentences.   will provide her with additional continuous nebulizer and reassess.  Patient is reassessed after continuous nebulizer lung sounds have improved immensely, patient does not have a tight sounding chest.  Only has intermittent wheezing heard bilaterally, will have her ambulate and reassess.  Patient is a family without difficulty, did not become tachypneic or hypoxic, patient agreed for discharge at this time.  Rule out-I have low suspicion for systemic infection as patient nontoxic-appearing, vital signs reassuring, no leukocytosis present on CBC.  I have low suspicion for ACS as presentation is atypical of etiology, EKG sinus without signs  of ischemia, patient does not endorse chest pain, pleuritic chest pain, no signs of hypoperfusion fluid overload noted on my exam.  I have low suspicion for CHF exacerbation as I do not notice peripheral edema,  no rales on my exam.  She does have slight increase in her BNP this appears to be around her baseline, she goes for dialysis tomorrow.  I have low suspicion for PE patient denies pleuritic chest pain, shortness of breath, she is nontachypneic, nonhypoxic, presentation a 2 of etiology.  It is noted the patient had a few instances of hypotension, but these resolved quickly, patient has a known history of hypotension, normally has a BP of 90 systolic other than her 1 BP of 75/49, rest were within normal limits for her.  Suspect this was an outlier.  Plan-  Cough-likely patient suffering from a viral bronchitis, due to her COPD history we will start her on antibiotics, start her on steroids, given strict return precautions.  Vital signs have remained stable, no indication for hospital  admission.    Patient given at home care as well strict return precautions.  Patient verbalized that they understood agreed to said plan.  Final Clinical Impression(s) / ED Diagnoses Final diagnoses:  Bronchitis    Rx / DC Orders ED Discharge Orders          Ordered    doxycycline (VIBRAMYCIN) 100 MG capsule  2 times daily,   Status:  Discontinued        07/11/21 1756    predniSONE (DELTASONE) 10 MG tablet  Daily,   Status:  Discontinued        07/11/21 1756    doxycycline (VIBRAMYCIN) 100 MG capsule  2 times daily        07/11/21 1810    predniSONE (DELTASONE) 10 MG tablet  Daily        07/11/21 1810             Aron Baba 07/11/21 2304    Hayden Rasmussen, MD 07/12/21 1100

## 2021-07-11 NOTE — Discharge Instructions (Addendum)
Likely you have a viral bronchitis, I have started you on antibiotics please take as prescribed.  Also given you steroids please take as prescribed.  I recommend using your albuterol and your Atrovent inhalers every 4-6 hours as needed for shortness of breath.  Please remain stay hydrated and I recommend staying in warm moist air as this will help with your breathing.   Please follow-up your PCP in weeks time for reevaluation.  I want you to come back if your symptoms worsening, you have worsening shortness of breath, chest pain, start develop fevers and chills despite antibiotic use, uncontrolled nausea vomiting diarrhea as you will need further eval.

## 2021-07-12 ENCOUNTER — Emergency Department (HOSPITAL_COMMUNITY): Payer: Medicare HMO

## 2021-07-12 ENCOUNTER — Encounter (HOSPITAL_COMMUNITY): Payer: Self-pay | Admitting: *Deleted

## 2021-07-12 ENCOUNTER — Inpatient Hospital Stay (HOSPITAL_COMMUNITY)
Admission: EM | Admit: 2021-07-12 | Discharge: 2021-07-15 | DRG: 190 | Disposition: A | Discharge status: Home / Self Care | Payer: Medicare HMO | Attending: Internal Medicine | Admitting: Internal Medicine

## 2021-07-12 DIAGNOSIS — Z992 Dependence on renal dialysis: Secondary | ICD-10-CM

## 2021-07-12 DIAGNOSIS — N186 End stage renal disease: Secondary | ICD-10-CM | POA: Diagnosis not present

## 2021-07-12 DIAGNOSIS — Z888 Allergy status to other drugs, medicaments and biological substances status: Secondary | ICD-10-CM

## 2021-07-12 DIAGNOSIS — E1122 Type 2 diabetes mellitus with diabetic chronic kidney disease: Secondary | ICD-10-CM | POA: Diagnosis present

## 2021-07-12 DIAGNOSIS — I9589 Other hypotension: Secondary | ICD-10-CM | POA: Diagnosis present

## 2021-07-12 DIAGNOSIS — Z20822 Contact with and (suspected) exposure to covid-19: Secondary | ICD-10-CM | POA: Diagnosis present

## 2021-07-12 DIAGNOSIS — T380X5A Adverse effect of glucocorticoids and synthetic analogues, initial encounter: Secondary | ICD-10-CM | POA: Diagnosis not present

## 2021-07-12 DIAGNOSIS — R6510 Systemic inflammatory response syndrome (SIRS) of non-infectious origin without acute organ dysfunction: Secondary | ICD-10-CM | POA: Diagnosis not present

## 2021-07-12 DIAGNOSIS — E669 Obesity, unspecified: Secondary | ICD-10-CM | POA: Diagnosis present

## 2021-07-12 DIAGNOSIS — J441 Chronic obstructive pulmonary disease with (acute) exacerbation: Secondary | ICD-10-CM | POA: Diagnosis present

## 2021-07-12 DIAGNOSIS — Z8249 Family history of ischemic heart disease and other diseases of the circulatory system: Secondary | ICD-10-CM

## 2021-07-12 DIAGNOSIS — J45901 Unspecified asthma with (acute) exacerbation: Secondary | ICD-10-CM | POA: Diagnosis not present

## 2021-07-12 DIAGNOSIS — D631 Anemia in chronic kidney disease: Secondary | ICD-10-CM | POA: Diagnosis present

## 2021-07-12 DIAGNOSIS — Z83438 Family history of other disorder of lipoprotein metabolism and other lipidemia: Secondary | ICD-10-CM

## 2021-07-12 DIAGNOSIS — R778 Other specified abnormalities of plasma proteins: Secondary | ICD-10-CM | POA: Diagnosis not present

## 2021-07-12 DIAGNOSIS — E1151 Type 2 diabetes mellitus with diabetic peripheral angiopathy without gangrene: Secondary | ICD-10-CM | POA: Diagnosis present

## 2021-07-12 DIAGNOSIS — K219 Gastro-esophageal reflux disease without esophagitis: Secondary | ICD-10-CM | POA: Diagnosis present

## 2021-07-12 DIAGNOSIS — J209 Acute bronchitis, unspecified: Secondary | ICD-10-CM | POA: Diagnosis present

## 2021-07-12 DIAGNOSIS — J42 Unspecified chronic bronchitis: Secondary | ICD-10-CM

## 2021-07-12 DIAGNOSIS — E875 Hyperkalemia: Secondary | ICD-10-CM | POA: Diagnosis not present

## 2021-07-12 DIAGNOSIS — F419 Anxiety disorder, unspecified: Secondary | ICD-10-CM | POA: Diagnosis present

## 2021-07-12 DIAGNOSIS — I5032 Chronic diastolic (congestive) heart failure: Secondary | ICD-10-CM | POA: Diagnosis present

## 2021-07-12 DIAGNOSIS — J9600 Acute respiratory failure, unspecified whether with hypoxia or hypercapnia: Secondary | ICD-10-CM | POA: Diagnosis not present

## 2021-07-12 DIAGNOSIS — E1165 Type 2 diabetes mellitus with hyperglycemia: Secondary | ICD-10-CM | POA: Diagnosis not present

## 2021-07-12 DIAGNOSIS — R0602 Shortness of breath: Secondary | ICD-10-CM

## 2021-07-12 DIAGNOSIS — Z7982 Long term (current) use of aspirin: Secondary | ICD-10-CM

## 2021-07-12 DIAGNOSIS — E782 Mixed hyperlipidemia: Secondary | ICD-10-CM | POA: Diagnosis present

## 2021-07-12 DIAGNOSIS — Z880 Allergy status to penicillin: Secondary | ICD-10-CM

## 2021-07-12 DIAGNOSIS — E119 Type 2 diabetes mellitus without complications: Secondary | ICD-10-CM

## 2021-07-12 DIAGNOSIS — Z7902 Long term (current) use of antithrombotics/antiplatelets: Secondary | ICD-10-CM

## 2021-07-12 DIAGNOSIS — J440 Chronic obstructive pulmonary disease with acute lower respiratory infection: Secondary | ICD-10-CM | POA: Diagnosis not present

## 2021-07-12 DIAGNOSIS — N2581 Secondary hyperparathyroidism of renal origin: Secondary | ICD-10-CM | POA: Diagnosis present

## 2021-07-12 DIAGNOSIS — Z794 Long term (current) use of insulin: Secondary | ICD-10-CM

## 2021-07-12 DIAGNOSIS — I252 Old myocardial infarction: Secondary | ICD-10-CM

## 2021-07-12 DIAGNOSIS — I132 Hypertensive heart and chronic kidney disease with heart failure and with stage 5 chronic kidney disease, or end stage renal disease: Secondary | ICD-10-CM | POA: Diagnosis present

## 2021-07-12 DIAGNOSIS — Z8349 Family history of other endocrine, nutritional and metabolic diseases: Secondary | ICD-10-CM

## 2021-07-12 DIAGNOSIS — R079 Chest pain, unspecified: Secondary | ICD-10-CM | POA: Diagnosis not present

## 2021-07-12 DIAGNOSIS — M898X9 Other specified disorders of bone, unspecified site: Secondary | ICD-10-CM | POA: Diagnosis present

## 2021-07-12 DIAGNOSIS — M109 Gout, unspecified: Secondary | ICD-10-CM | POA: Diagnosis present

## 2021-07-12 DIAGNOSIS — E039 Hypothyroidism, unspecified: Secondary | ICD-10-CM | POA: Diagnosis present

## 2021-07-12 DIAGNOSIS — Z6836 Body mass index (BMI) 36.0-36.9, adult: Secondary | ICD-10-CM

## 2021-07-12 DIAGNOSIS — Z79899 Other long term (current) drug therapy: Secondary | ICD-10-CM

## 2021-07-12 DIAGNOSIS — Z79890 Hormone replacement therapy (postmenopausal): Secondary | ICD-10-CM

## 2021-07-12 DIAGNOSIS — J449 Chronic obstructive pulmonary disease, unspecified: Secondary | ICD-10-CM | POA: Diagnosis present

## 2021-07-12 DIAGNOSIS — I2510 Atherosclerotic heart disease of native coronary artery without angina pectoris: Secondary | ICD-10-CM | POA: Diagnosis present

## 2021-07-12 DIAGNOSIS — Z955 Presence of coronary angioplasty implant and graft: Secondary | ICD-10-CM

## 2021-07-12 DIAGNOSIS — Z87891 Personal history of nicotine dependence: Secondary | ICD-10-CM

## 2021-07-12 DIAGNOSIS — Z833 Family history of diabetes mellitus: Secondary | ICD-10-CM

## 2021-07-12 DIAGNOSIS — Z951 Presence of aortocoronary bypass graft: Secondary | ICD-10-CM

## 2021-07-12 LAB — CBC
HCT: 34.5 % — ABNORMAL LOW (ref 36.0–46.0)
HCT: 36.3 % (ref 36.0–46.0)
Hemoglobin: 11 g/dL — ABNORMAL LOW (ref 12.0–15.0)
Hemoglobin: 11.5 g/dL — ABNORMAL LOW (ref 12.0–15.0)
MCH: 33.6 pg (ref 26.0–34.0)
MCH: 33.8 pg (ref 26.0–34.0)
MCHC: 31.9 g/dL (ref 30.0–36.0)
MCHC: 31.7 g/dL (ref 30.0–36.0)
MCV: 105.5 fL — ABNORMAL HIGH (ref 80.0–100.0)
MCV: 106.8 fL — ABNORMAL HIGH (ref 80.0–100.0)
Platelets: 175 10*3/uL (ref 150–400)
Platelets: 225 10*3/uL (ref 150–400)
RBC: 3.27 MIL/uL — ABNORMAL LOW (ref 3.87–5.11)
RBC: 3.4 MIL/uL — ABNORMAL LOW (ref 3.87–5.11)
RDW: 15.9 % — ABNORMAL HIGH (ref 11.5–15.5)
RDW: 15.8 % — ABNORMAL HIGH (ref 11.5–15.5)
WBC: 13.1 10*3/uL — ABNORMAL HIGH (ref 4.0–10.5)
WBC: 15.8 10*3/uL — ABNORMAL HIGH (ref 4.0–10.5)
nRBC: 0 % (ref 0.0–0.2)
nRBC: 0 % (ref 0.0–0.2)

## 2021-07-12 LAB — RENAL FUNCTION PANEL
Albumin: 3.3 g/dL — ABNORMAL LOW (ref 3.5–5.0)
Albumin: 3.4 g/dL — ABNORMAL LOW (ref 3.5–5.0)
Anion gap: 15 (ref 5–15)
Anion gap: 15 (ref 5–15)
BUN: 66 mg/dL — ABNORMAL HIGH (ref 8–23)
BUN: 67 mg/dL — ABNORMAL HIGH (ref 8–23)
CO2: 24 mmol/L (ref 22–32)
CO2: 24 mmol/L (ref 22–32)
Calcium: 9.3 mg/dL (ref 8.9–10.3)
Calcium: 9.4 mg/dL (ref 8.9–10.3)
Chloride: 94 mmol/L — ABNORMAL LOW (ref 98–111)
Chloride: 94 mmol/L — ABNORMAL LOW (ref 98–111)
Creatinine, Ser: 7.26 mg/dL — ABNORMAL HIGH (ref 0.44–1.00)
Creatinine, Ser: 7.28 mg/dL — ABNORMAL HIGH (ref 0.44–1.00)
GFR, Estimated: 6 mL/min — ABNORMAL LOW (ref >60)
GFR, Estimated: 6 mL/min — ABNORMAL LOW (ref >60)
Glucose, Bld: 368 mg/dL — ABNORMAL HIGH (ref 70–99)
Glucose, Bld: 369 mg/dL — ABNORMAL HIGH (ref 70–99)
Phosphorus: 6.7 mg/dL — ABNORMAL HIGH (ref 2.5–4.6)
Phosphorus: 6.9 mg/dL — ABNORMAL HIGH (ref 2.5–4.6)
Potassium: 4.7 mmol/L (ref 3.5–5.1)
Potassium: 4.8 mmol/L (ref 3.5–5.1)
Sodium: 133 mmol/L — ABNORMAL LOW (ref 135–145)
Sodium: 133 mmol/L — ABNORMAL LOW (ref 135–145)

## 2021-07-12 LAB — BASIC METABOLIC PANEL
Anion gap: 13 (ref 5–15)
BUN: 63 mg/dL — ABNORMAL HIGH (ref 8–23)
CO2: 23 mmol/L (ref 22–32)
Calcium: 8.6 mg/dL — ABNORMAL LOW (ref 8.9–10.3)
Chloride: 94 mmol/L — ABNORMAL LOW (ref 98–111)
Creatinine, Ser: 7.27 mg/dL — ABNORMAL HIGH (ref 0.44–1.00)
GFR, Estimated: 6 mL/min — ABNORMAL LOW (ref >60)
Glucose, Bld: 398 mg/dL — ABNORMAL HIGH (ref 70–99)
Potassium: 4.8 mmol/L (ref 3.5–5.1)
Sodium: 130 mmol/L — ABNORMAL LOW (ref 135–145)

## 2021-07-12 LAB — TROPONIN I (HIGH SENSITIVITY)
Troponin I (High Sensitivity): 21 ng/L — ABNORMAL HIGH (ref <18)
Troponin I (High Sensitivity): 26 ng/L — ABNORMAL HIGH (ref <18)

## 2021-07-12 LAB — HEPATITIS B SURFACE ANTIBODY,QUALITATIVE: Hep B S Ab: NONREACTIVE

## 2021-07-12 LAB — CBG MONITORING, ED: Glucose-Capillary: 422 mg/dL — ABNORMAL HIGH (ref 70–99)

## 2021-07-12 LAB — HEPATITIS B SURFACE ANTIGEN: Hepatitis B Surface Ag: NONREACTIVE

## 2021-07-12 LAB — LACTIC ACID, PLASMA: Lactic Acid, Venous: 1.4 mmol/L (ref 0.5–1.9)

## 2021-07-12 MED ORDER — CHLORHEXIDINE GLUCONATE CLOTH 2 % EX PADS
6.0000 | MEDICATED_PAD | Freq: Every day | CUTANEOUS | Status: DC
  Administered 2021-07-13 – 2021-07-15 (×3): 6 via TOPICAL

## 2021-07-12 MED ORDER — ACETAMINOPHEN 325 MG PO TABS: 650.0000 mg | ORAL_TABLET | Freq: Once | ORAL | Status: DC

## 2021-07-12 MED ORDER — ALBUTEROL (5 MG/ML) CONTINUOUS INHALATION SOLN
10.0000 mg/h | INHALATION_SOLUTION | Freq: Once | RESPIRATORY_TRACT | Status: DC
  Filled 2021-07-12: qty 20

## 2021-07-12 MED ORDER — SODIUM CHLORIDE 0.9 % IV SOLN: 100.0000 mL | INTRAVENOUS | Status: DC | PRN

## 2021-07-12 MED ORDER — HEPARIN SODIUM (PORCINE) 1000 UNIT/ML DIALYSIS: 1000.0000 [IU] | INTRAMUSCULAR | Status: DC | PRN

## 2021-07-12 MED ORDER — METHYLPREDNISOLONE SODIUM SUCC 125 MG IJ SOLR
125.0000 mg | Freq: Two times a day (BID) | INTRAMUSCULAR | Status: DC
  Administered 2021-07-13 – 2021-07-14 (×3): 125 mg via INTRAVENOUS
  Filled 2021-07-12 (×3): qty 2

## 2021-07-12 MED ORDER — INSULIN ASPART PROT & ASPART (70-30 MIX) 100 UNIT/ML ~~LOC~~ SUSP
24.0000 [IU] | Freq: Two times a day (BID) | SUBCUTANEOUS | Status: DC
  Administered 2021-07-13: 24 [IU] via SUBCUTANEOUS
  Filled 2021-07-12: qty 10

## 2021-07-12 MED ORDER — INSULIN ASPART 100 UNIT/ML IJ SOLN
0.0000 [IU] | Freq: Three times a day (TID) | INTRAMUSCULAR | Status: DC
  Administered 2021-07-13: 2 [IU] via SUBCUTANEOUS
  Administered 2021-07-13 (×2): 15 [IU] via SUBCUTANEOUS
  Administered 2021-07-14: 3 [IU] via SUBCUTANEOUS
  Administered 2021-07-14 (×2): 15 [IU] via SUBCUTANEOUS
  Administered 2021-07-15: 8 [IU] via SUBCUTANEOUS
  Administered 2021-07-15: 3 [IU] via SUBCUTANEOUS

## 2021-07-12 MED ORDER — ALBUTEROL SULFATE (2.5 MG/3ML) 0.083% IN NEBU
2.5000 mg | INHALATION_SOLUTION | RESPIRATORY_TRACT | Status: DC | PRN
  Administered 2021-07-14: 2.5 mg via RESPIRATORY_TRACT
  Filled 2021-07-12: qty 3

## 2021-07-12 MED ORDER — PENTAFLUOROPROP-TETRAFLUOROETH EX AERO: 1.0000 "application " | INHALATION_SPRAY | CUTANEOUS | Status: DC | PRN

## 2021-07-12 MED ORDER — ATORVASTATIN CALCIUM 40 MG PO TABS
80.0000 mg | ORAL_TABLET | Freq: Every evening | ORAL | Status: DC
  Administered 2021-07-12 – 2021-07-14 (×3): 80 mg via ORAL
  Filled 2021-07-12 (×3): qty 2

## 2021-07-12 MED ORDER — HEPARIN SODIUM (PORCINE) 1000 UNIT/ML DIALYSIS: 20.0000 [IU]/kg | INTRAMUSCULAR | Status: DC | PRN

## 2021-07-12 MED ORDER — ASPIRIN EC 81 MG PO TBEC
81.0000 mg | DELAYED_RELEASE_TABLET | Freq: Every day | ORAL | Status: DC
  Administered 2021-07-13 – 2021-07-15 (×3): 81 mg via ORAL
  Filled 2021-07-12 (×3): qty 1

## 2021-07-12 MED ORDER — DEXTROSE 5 % IV SOLN
250.0000 mg | INTRAVENOUS | Status: DC
  Administered 2021-07-13 – 2021-07-14 (×3): 250 mg via INTRAVENOUS
  Filled 2021-07-12 (×6): qty 250

## 2021-07-12 MED ORDER — ALTEPLASE 2 MG IJ SOLR
2.0000 mg | Freq: Once | INTRAMUSCULAR | Status: DC | PRN
  Filled 2021-07-12: qty 2

## 2021-07-12 MED ORDER — METHYLPREDNISOLONE SODIUM SUCC 125 MG IJ SOLR
125.0000 mg | Freq: Once | INTRAMUSCULAR | Status: AC
  Administered 2021-07-12: 125 mg via INTRAVENOUS
  Filled 2021-07-12: qty 2

## 2021-07-12 MED ORDER — LIDOCAINE HCL (PF) 1 % IJ SOLN: 5.0000 mL | INTRAMUSCULAR | Status: DC | PRN

## 2021-07-12 MED ORDER — LIDOCAINE-PRILOCAINE 2.5-2.5 % EX CREA: 1.0000 "application " | TOPICAL_CREAM | CUTANEOUS | Status: DC | PRN

## 2021-07-12 MED ORDER — ENOXAPARIN SODIUM 30 MG/0.3ML IJ SOSY: 30.0000 mg | PREFILLED_SYRINGE | INTRAMUSCULAR | Status: DC

## 2021-07-12 MED ORDER — PANTOPRAZOLE SODIUM 40 MG PO TBEC
40.0000 mg | DELAYED_RELEASE_TABLET | Freq: Two times a day (BID) | ORAL | Status: DC
  Administered 2021-07-12 – 2021-07-15 (×6): 40 mg via ORAL
  Filled 2021-07-12 (×6): qty 1

## 2021-07-12 MED ORDER — SODIUM CHLORIDE 0.9 % IV SOLN
100.0000 mg | Freq: Two times a day (BID) | INTRAVENOUS | Status: DC
  Filled 2021-07-12 (×2): qty 100

## 2021-07-12 MED ORDER — HEPARIN SODIUM (PORCINE) 5000 UNIT/ML IJ SOLN
5000.0000 [IU] | Freq: Three times a day (TID) | INTRAMUSCULAR | Status: DC
  Administered 2021-07-12 – 2021-07-15 (×8): 5000 [IU] via SUBCUTANEOUS
  Filled 2021-07-12 (×7): qty 1

## 2021-07-12 MED ORDER — IPRATROPIUM BROMIDE 0.02 % IN SOLN
0.5000 mg | Freq: Once | RESPIRATORY_TRACT | Status: AC
  Administered 2021-07-12: 0.5 mg via RESPIRATORY_TRACT
  Filled 2021-07-12: qty 2.5

## 2021-07-12 MED ORDER — INSULIN ASPART 100 UNIT/ML IJ SOLN
0.0000 [IU] | Freq: Every day | INTRAMUSCULAR | Status: DC
  Administered 2021-07-13: 2 [IU] via SUBCUTANEOUS
  Administered 2021-07-13: 5 [IU] via SUBCUTANEOUS

## 2021-07-12 MED ORDER — INSULIN ISOPHANE & REGULAR (HUMAN 70-30)100 UNIT/ML KWIKPEN: 24.0000 [IU] | PEN_INJECTOR | Freq: Two times a day (BID) | SUBCUTANEOUS | Status: DC

## 2021-07-12 MED ORDER — INSULIN ASPART 100 UNIT/ML IJ SOLN
2.0000 [IU] | Freq: Three times a day (TID) | INTRAMUSCULAR | Status: DC
  Administered 2021-07-13: 2 [IU] via SUBCUTANEOUS

## 2021-07-12 MED ORDER — CLOPIDOGREL BISULFATE 75 MG PO TABS
75.0000 mg | ORAL_TABLET | Freq: Every day | ORAL | Status: DC
  Administered 2021-07-13 – 2021-07-15 (×3): 75 mg via ORAL
  Filled 2021-07-12 (×3): qty 1

## 2021-07-12 MED ORDER — ALBUTEROL SULFATE (2.5 MG/3ML) 0.083% IN NEBU
INHALATION_SOLUTION | RESPIRATORY_TRACT | Status: AC
  Filled 2021-07-12: qty 12

## 2021-07-12 NOTE — H&P (Addendum)
History and Physical    Donna Howe UDJ:[id number] DOB: [birth year] DOA: 07/12/2021  Referring MD/NP/PA: Marijean Bravo  PCP: Curlene Labrum, MD  Outpatient Specialists: Nephrology-Zeyfang PA, Cards Leonides Sake NP  Patient coming from: Home   Chief Complaint: acute respiratory failure, bronchitis, SIRS  HPI: Donna Howe is a 64 y.o. female with medical history significant of coronary artery disease status post CABG and multiple PCI's, chronic hypotension, GERD, ESRD on hemodialysis Tuesday Thursday Saturday, type 2 diabetes, asthma presented with acute respiratory failure bronchitis and SIRS.  Patient reports 3 to 4 days of increased work of breathing cough and wheezing.  Was seen by the ER yesterday for similar symptoms.  Was diagnosed with viral bronchitis given her some antibiotics as well as steroids.  Patient states symptoms have persisted over the course of the past 24 hours.  Positive persistent cough and wheezing.  Positive pleuritic chest pain.  Chest pain worse with deep breathing.  No nausea or vomiting.  No abdominal pain.  No hemiparesis or confusion.  Has been using inhalers at home with minimal improvement in symptoms.  No fevers or chills.  No malaise.  Patient denies any recent sick contacts.  Patient reports prior episodes like this in the past associated with asthma/bronchitis.  Has required protracted course of steroids.  Noted admission last month 926 10/27/1928 for NSTEMI.  Patient denies any chest pain similar to prior presentation. ED Course: Presented to the ER afebrile, BP 70s to 100s over 30s to 60s.  White count 13.1, hemoglobin 11, creatinine 7.27, glucose 398, troponin 21-26.  Noted troponin of 12,000 during prior admission.  Chest x-ray within normal limits.  EKG with normal sinus rhythm and nonspecific ST and T wave abnormalities.  Review of Systems: As per HPI otherwise 10 point review of systems negative.    Past Medical History:  Diagnosis Date   Anemia    Anxiety     Asthma    CAD (coronary artery disease)    Multivessel s/p CABG 2005, numerous PCIs since that time and documented graft disease   Carotid artery disease ([place])    R CEA   ESRD on hemodialysis ([place])    Essential hypertension    Gout    History of blood transfusion    Hyperlipidemia    Hypothyroidism    Myocardial infarction [facility])    PAD (peripheral artery disease) ([place])    Dr. Kellie Simmering   Pneumonia 09/2019, 11/2019   S/P angioplasty with stent- DES to mRCA and to LIMA to LAD with DES 04/09/18.   04/10/2018   SBO (small bowel obstruction) ([place]) 2011   Status post lysis of adhesions & hernia repair   Sinus bradycardia    Type 2 diabetes mellitus ([place])    Umbilical hernia     Past Surgical History:  Procedure Laterality Date   AORTIC ARCH ANGIOGRAPHY N/A 08/02/2020   Procedure: AORTIC ARCH ANGIOGRAPHY;  Surgeon: Waynetta Sandy, MD;  Location: [place] [facility];  Service: Cardiovascular;  Laterality: N/A;  Lt upper extermity   AV FISTULA PLACEMENT Left 06/29/2020   Procedure: LEFT ARM ARTERIOVENOUS (AV) FISTULA;  Surgeon: Waynetta Sandy, MD;  Location: [place];  Service: Vascular;  Laterality: Left;  ARM   AV FISTULA PLACEMENT Right 05/11/2021   Procedure: RIGHT BRACHIOCEPHALIC ARTERIOVENOUS (AV) FISTULA CREATION;  Surgeon: Waynetta Sandy, MD;  Location: [facility] OR;  Service: Vascular;  Laterality: Right;   [place]  2010   CORONARY ARTERY  BYPASS GRAFT  2005   CORONARY BALLOON ANGIOPLASTY N/A 05/31/2017   Procedure: CORONARY BALLOON ANGIOPLASTY;  Surgeon: Jettie Booze, MD;  Location: [place] [facility];  Service: Cardiovascular;  Laterality: N/A;   CORONARY BALLOON ANGIOPLASTY N/A 06/14/2021   Procedure: CORONARY BALLOON ANGIOPLASTY;  Surgeon: Jettie Booze, MD;  Location: [place] [facility];  Service: Cardiovascular;  Laterality: N/A;   CORONARY STENT INTERVENTION N/A 05/31/2017   Procedure: CORONARY STENT  INTERVENTION;  Surgeon: Jettie Booze, MD;  Location: [place] [facility];  Service: Cardiovascular;  Laterality: N/A;   CORONARY STENT INTERVENTION N/A 04/09/2018   Procedure: CORONARY STENT INTERVENTION;  Surgeon: Jettie Booze, MD;  Location: [place] [facility];  Service: Cardiovascular;  Laterality: N/A;  SVG RCA   CORONARY STENT INTERVENTION N/A 01/10/2021   Procedure: CORONARY STENT INTERVENTION;  Surgeon: Leonie Man, MD;  Location: [place] [facility];  Service: Cardiovascular;  Laterality: N/A;   CORONARY/GRAFT ACUTE MI REVASCULARIZATION N/A 02/24/2021   Procedure: Coronary/Graft Acute MI Revascularization;  Surgeon: Jettie Booze, MD;  Location: [place] [facility];  Service: Cardiovascular;  Laterality: N/A;   ENDARTERECTOMY Right 04/18/2013   Procedure: ENDARTERECTOMY CAROTID;  Surgeon: Mal Misty, MD;  Location: [place];  Service: Vascular;  Laterality: Right;   HERNIA REPAIR  1989   Incisional hernia repair x2  03/04/2010   Laparoscopic with 35cm mesh by Dr Ronnald Collum   IR [place] CV LINE RIGHT  06/21/2020   IR US GUIDE VASC ACCESS RIGHT  06/21/2020   LEFT HEART CATH AND CORS/GRAFTS ANGIOGRAPHY N/A 05/31/2017   Procedure: LEFT HEART CATH AND CORS/GRAFTS ANGIOGRAPHY;  Surgeon: Jettie Booze, MD;  Location: [place] [facility];  Service: Cardiovascular;  Laterality: N/A;   LEFT HEART CATH AND CORS/GRAFTS ANGIOGRAPHY N/A 04/08/2018   Procedure: LEFT HEART CATH AND CORS/GRAFTS ANGIOGRAPHY;  Surgeon: Jettie Booze, MD;  Location: [place] [facility];  Service: Cardiovascular;  Laterality: N/A;   LEFT HEART CATH AND CORS/GRAFTS ANGIOGRAPHY N/A 06/22/2020   Procedure: LEFT HEART CATH AND CORS/GRAFTS ANGIOGRAPHY;  Surgeon: Belva Crome, MD;  Location: [place] [facility];  Service: Cardiovascular;  Laterality: N/A;   LEFT HEART CATH AND CORS/GRAFTS ANGIOGRAPHY N/A 01/10/2021   Procedure: LEFT HEART CATH AND CORS/GRAFTS ANGIOGRAPHY;  Surgeon: Leonie Man,  MD;  Location: [place] [facility];  Service: Cardiovascular;  Laterality: N/A;   LEFT HEART CATH AND CORS/GRAFTS ANGIOGRAPHY N/A 02/24/2021   Procedure: LEFT HEART CATH AND CORS/GRAFTS ANGIOGRAPHY;  Surgeon: Jettie Booze, MD;  Location: [place] [facility];  Service: Cardiovascular;  Laterality: N/A;   LEFT HEART CATH AND CORS/GRAFTS ANGIOGRAPHY N/A 06/14/2021   Procedure: LEFT HEART CATH AND CORS/GRAFTS ANGIOGRAPHY;  Surgeon: Jettie Booze, MD;  Location: [place] [facility];  Service: Cardiovascular;  Laterality: N/A;   LEFT HEART CATHETERIZATION WITH CORONARY ANGIOGRAM N/A 12/19/2012   Procedure: LEFT HEART CATHETERIZATION WITH CORONARY ANGIOGRAM;  Surgeon: Josue Hector, MD;  Location: [facility] CATH LAB;  Service: Cardiovascular;  Laterality: N/A;   LEFT HEART CATHETERIZATION WITH CORONARY/GRAFT ANGIOGRAM N/A 04/19/2013   Procedure: LEFT HEART CATHETERIZATION WITH Beatrix Fetters;  Surgeon: Lorretta Harp, MD;  Location: [facility] CATH LAB;  Service: Cardiovascular;  Laterality: N/A;   LIGATION OF ARTERIOVENOUS  FISTULA Left 09/15/2020   Procedure: LIGATION OF LEFT ARM ARTERIOVENOUS  FISTULA;  Surgeon: Waynetta Sandy, MD;  Location: [place];  Service: Vascular;  Laterality: Left;   PATCH ANGIOPLASTY  Right 04/18/2013   Procedure: PATCH ANGIOPLASTY Right Internal Carotid Artery;  Surgeon: Mal Misty, MD;  Location: [place];  Service: Vascular;  Laterality: Right;   PERCUTANEOUS CORONARY STENT INTERVENTION (PCI-S) Right 12/19/2012   Procedure: PERCUTANEOUS CORONARY STENT INTERVENTION (PCI-S);  Surgeon: Josue Hector, MD;  Location: [facility] CATH LAB;  Service: Cardiovascular;  Laterality: Right;   PERIPHERAL VASCULAR INTERVENTION Left 08/02/2020   Procedure: PERIPHERAL VASCULAR INTERVENTION;  Surgeon: Waynetta Sandy, MD;  Location: [place] [facility];  Service: Cardiovascular;  Laterality: Left;  Left subclavian   PERIPHERAL VASCULAR INTERVENTION  02/24/2021   Procedure: PERIPHERAL  VASCULAR INTERVENTION;  Surgeon: Marty Heck, MD;  Location: [place] [facility];  Service: Vascular;;   SHOULDER SURGERY       reports that she has been smoking cigarettes. She has a 20.00 pack-year smoking history. She has never used smokeless tobacco. She reports that she does not drink alcohol and does not use drugs.  Allergies  Allergen Reactions   Penicillins Other (See Comments)    REACTION: Unknown, told as a child Has patient had a PCN reaction causing immediate rash, facial/tongue/throat swelling, SOB or lightheadedness with hypotension: Unknown Has patient had a PCN reaction causing severe rash involving mucus membranes or skin necrosis: Unknown Has patient had a PCN reaction that required hospitalization: Unknown Has patient had a PCN reaction occurring within the last 10 years: No If all of the above answers are "NO", then may proceed with Cephalosporin use.    Beta Adrenergic Blockers Other (See Comments)    Hypotension    Family History  Problem Relation Age of Onset   Diabetes Mother    Heart disease Mother        before age 23   Hyperlipidemia Mother    Hypertension Mother    Thyroid disease Father    Hypertension Father    AAA (abdominal aortic aneurysm) Father    Heart disease Brother        before age 54   Hypertension Brother    Hyperlipidemia Son    Hypertension Son    Unacceptable: Noncontributory, unremarkable, or negative. Acceptable: Family history reviewed and not pertinent (If you reviewed it)  Prior to Admission medications   Medication Sig Start Date End Date Taking? Authorizing Provider  allopurinol (ZYLOPRIM) 100 MG tablet Take one tablet after hemodialysis, on Tuesday, Thursday and Saturday. Patient taking differently: Take 100 mg by mouth Every Tuesday,Thursday,and Saturday with dialysis. After dialysis 06/30/20  Yes Arrien, Jimmy Picket, MD  ALPRAZolam Duanne Moron) 0.5 MG tablet Take 0.25-0.5 mg by mouth See admin instructions. Take  0.25 in the morning and evening and 0.5 mg at bedtime   Yes [provider]  aspirin EC 81 MG tablet Take 81 mg by mouth daily.    Yes [provider]  atorvastatin (LIPITOR) 80 MG tablet Take 1 tablet (80 mg total) by mouth every evening. Patient taking differently: Take 80 mg by mouth at bedtime. 12/19/19  Yes Johnson, Clanford L, MD  clopidogrel (PLAVIX) 75 MG tablet Take 1 tablet (75 mg total) by mouth daily. 08/27/18  Yes Satira Sark, MD  cyclobenzaprine (FLEXERIL) 5 MG tablet Take 1 tablet (5 mg total) by mouth 2 (two) times daily with a meal. 06/17/21  Yes Domenic Polite, MD  diclofenac Sodium (VOLTAREN) 1 % GEL Apply topically. 06/24/21  Yes [provider]  doxycycline (VIBRAMYCIN) 100 MG capsule Take 1 capsule (100 mg total) by mouth 2 (two) times daily. 07/11/21  Yes Marcello Fennel, PA-C  ezetimibe (ZETIA) 10 MG tablet Take 1 tablet (10 mg total) by mouth daily. Patient taking differently: Take 10 mg by mouth at bedtime. 02/25/21  Yes Bhagat, Bhavinkumar, PA  ferric citrate (AURYXIA) 1 GM 210 MG(Fe) tablet Take 420 mg by mouth 3 (three) times daily with meals.   Yes [provider]  gabapentin (NEURONTIN) 400 MG capsule Take 1 capsule (400 mg total) by mouth at bedtime. 06/17/21  Yes Domenic Polite, MD  ipratropium (ATROVENT HFA) 17 MCG/ACT inhaler Inhale 2 puffs into the lungs every 4 (four) hours as needed for wheezing.    Yes [provider]  levothyroxine (SYNTHROID) 112 MCG tablet Take 112 mcg by mouth daily before breakfast.   Yes [provider]  Methoxy PEG-Epoetin Beta (MIRCERA IJ) Mircera 06/28/21 06/27/22 Yes [provider]  midodrine (PROAMATINE) 10 MG tablet Take 1 tablet (10 mg total) by mouth 2 (two) times daily with a meal. 06/17/21  Yes Domenic Polite, MD  multivitamin (RENA-VIT) TABS tablet Take 1 tablet by mouth daily.   Yes [provider]  nitroGLYCERIN (NITROSTAT) 0.4 MG SL tablet Place  1 tablet (0.4 mg total) under the tongue every 5 (five) minutes x 3 doses as needed for chest pain. 02/25/21  Yes Bhagat, Bhavinkumar, PA  NOVOLIN 70/30 KWIKPEN (70-30) 100 UNIT/ML KwikPen Inject 20 Units into the skin in the morning and at bedtime. Patient taking differently: Inject 24 Units into the skin in the morning and at bedtime. 06/17/21  Yes Domenic Polite, MD  ondansetron (ZOFRAN) 8 MG tablet Take 8 mg by mouth every 8 (eight) hours as needed for nausea. 07/12/20  Yes [provider]  pantoprazole (PROTONIX) 40 MG tablet Take 1 tablet (40 mg total) by mouth 2 (two) times daily. 06/17/20  Yes Barton Dubois, MD  predniSONE (DELTASONE) 10 MG tablet Take 3 tablets (30 mg total) by mouth daily for 5 days. 07/11/21 07/16/21 Yes Marcello Fennel, PA-C  ranolazine (RANEXA) 500 MG 12 hr tablet Take 1 tablet (500 mg total) by mouth at bedtime. 06/17/21  Yes Domenic Polite, MD  Vitamin D, Ergocalciferol, (DRISDOL) 1.25 MG (50000 UNIT) CAPS capsule Take 50,000 Units by mouth every Sunday.  12/31/19  Yes [provider]  Albuterol Sulfate 108 (90 Base) MCG/ACT AEPB Inhale 2 puffs into the lungs every 6 (six) hours as needed (Shortness of breath). Patient not taking: Reported on 07/12/2021    [provider]  cyclobenzaprine (FLEXERIL) 5 MG tablet Take by mouth. Patient not taking: No sig reported 06/24/21   [provider]  diclofenac Sodium (VOLTAREN) 1 % GEL Apply 2-4 g topically daily as needed (FOR NECK PAIN). Patient not taking: Reported on 07/12/2021    [provider]  [facility] ULTRA test strip Check blood glucose THREE TIMES DAILY 03/31/21   [provider]    Physical Exam: Vitals:   07/12/21 2003 07/12/21 2020 07/12/21 2030 07/12/21 2042  BP:    (!) 87/28  Pulse: 99 100 100 (!) 101  Resp: (!) 22   14  Temp:    (!) 96.8 F (36 C)  TempSrc:    Axillary  SpO2: 96% 94% 96% 100%      Constitutional: NAD, calm, comfortable Vitals:    07/12/21 2003 07/12/21 2020 07/12/21 2030 07/12/21 2042  BP:    (!) 87/28  Pulse: 99 100 100 (!) 101  Resp: (!) 22   14  Temp:    (!) 96.8 F (36  C)  TempSrc:    Axillary  SpO2: 96% 94% 96% 100%   Gen: minimal distress Eyes: PERRL, lids and conjunctivae normal ENMT: Mucous membranes are moist. Posterior pharynx clear of any exudate or lesions.Normal dentition.  Neck: normal, supple, no masses, no thyromegaly Respiratory: + wheezing, no crackles. Normal respiratory effort. No accessory muscle use.  Cardiovascular: Regular rate and rhythm, no murmurs / rubs / gallops. No extremity edema. 2+ pedal pulses. No carotid bruits.  Abdomen: no tenderness, no masses palpated. No hepatosplenomegaly. Bowel sounds positive.  Musculoskeletal: no clubbing / cyanosis. No joint deformity upper and lower extremities. Good ROM, no contractures. Normal muscle tone.  Skin: no rashes, lesions, ulcers. No induration Neurologic: CN 2-12 grossly intact. Sensation intact, DTR normal. Strength 5/5 in all 4.  Psychiatric: Normal judgment and insight. Alert and oriented x 3. Normal mood.    Labs on Admission: I have personally reviewed following labs and imaging studies  CBC: Recent Labs  Lab 07/11/21 1407 07/12/21 1129 07/12/21 1503  WBC 10.5 13.1* 15.8*  NEUTROABS 6.9  --   --   HGB 12.1 11.0* 11.5*  HCT 37.4 34.5* 36.3  MCV 108.1* 105.5* 106.8*  PLT 200 175 528   Basic Metabolic Panel: Recent Labs  Lab 07/11/21 1407 07/12/21 1129 07/12/21 1503  NA 135 130* 133*  133*  K 4.4 4.8 4.8  4.7  CL 97* 94* 94*  94*  CO2 27 23 24  24   GLUCOSE 134* 398* 369*  368*  BUN 47* 63* 66*  67*  CREATININE 6.38* 7.27* 7.28*  7.26*  CALCIUM 9.4 8.6* 9.4  9.3  MG 2.3  --   --   PHOS  --   --  6.9*  6.7*   GFR: Estimated Creatinine Clearance: 8.9 mL/min (A) (by C-G formula based on SCr of 7.26 mg/dL (H)). Liver Function Tests: Recent Labs  Lab 07/11/21 1407 07/12/21 1503  AST 17  --   ALT 15   --   ALKPHOS 81  --   BILITOT 0.6  --   PROT 7.0  --   ALBUMIN 3.3* 3.3*  3.4*   No results for input(s): LIPASE, AMYLASE in the last 168 hours. No results for input(s): AMMONIA in the last 168 hours. Coagulation Profile: No results for input(s): INR, PROTIME in the last 168 hours. Cardiac Enzymes: No results for input(s): CKTOTAL, CKMB, CKMBINDEX, TROPONINI in the last 168 hours. BNP (last 3 results) No results for input(s): PROBNP in the last 8760 hours. HbA1C: No results for input(s): HGBA1C in the last 72 hours. CBG: No results for input(s): GLUCAP in the last 168 hours. Lipid Profile: No results for input(s): CHOL, HDL, LDLCALC, TRIG, CHOLHDL, LDLDIRECT in the last 72 hours. Thyroid Function Tests: No results for input(s): TSH, T4TOTAL, FREET4, T3FREE, THYROIDAB in the last 72 hours. Anemia Panel: No results for input(s): VITAMINB12, FOLATE, FERRITIN, TIBC, IRON, RETICCTPCT in the last 72 hours. Urine analysis:    Component Value Date/Time   COLORURINE YELLOW 06/18/2020 1536   APPEARANCEUR CLOUDY (A) 06/18/2020 1536   LABSPEC 1.015 06/18/2020 1536   PHURINE 5.0 06/18/2020 1536   GLUCOSEU 150 (A) 06/18/2020 1536   HGBUR NEGATIVE 06/18/2020 [place] 06/18/2020 1536   KETONESUR NEGATIVE 06/18/2020 1536   PROTEINUR >=300 (A) 06/18/2020 1536   UROBILINOGEN 0.2 10/26/2013 2229   NITRITE NEGATIVE 06/18/2020 1536   LEUKOCYTESUR NEGATIVE 06/18/2020 1536   Sepsis Labs: @LABRCNTIP (procalcitonin:4,lacticidven:4) ) Recent Results (from the past 240 hour(s))  Resp Panel by RT-PCR (Flu A&B, Covid) Nasopharyngeal Swab     Status: None   Collection Time: 07/11/21  4:02 PM   Specimen: Nasopharyngeal Swab; Nasopharyngeal(NP) swabs in vial transport medium  Result Value Ref Range Status   SARS Coronavirus 2 by RT PCR NEGATIVE NEGATIVE Final    Comment: (NOTE) SARS-CoV-2 target nucleic acids are NOT DETECTED.  The SARS-CoV-2 RNA is generally detectable in  upper respiratory specimens during the acute phase of infection. The lowest concentration of SARS-CoV-2 viral copies this assay can detect is 138 copies/mL. A negative result does not preclude SARS-Cov-2 infection and should not be used as the sole basis for treatment or other patient management decisions. A negative result may occur with  improper specimen collection/handling, submission of specimen other than nasopharyngeal swab, presence of viral mutation(s) within the areas targeted by this assay, and inadequate number of viral copies(<138 copies/mL). A negative result must be combined with clinical observations, patient history, and epidemiological information. The expected result is Negative.  Fact Sheet for Patients:  EntrepreneurPulse.com.au  Fact Sheet for Healthcare Providers:  IncredibleEmployment.be  This test is no t yet approved or cleared by the [place] FDA and  has been authorized for detection and/or diagnosis of SARS-CoV-2 by FDA under an Emergency Use Authorization (EUA). This EUA will remain  in effect (meaning this test can be used) for the duration of the COVID-19 declaration under Section 564(b)(1) of the Act, 21 U.S.C.section 360bbb-3(b)(1), unless the authorization is terminated  or revoked sooner.       Influenza A by PCR NEGATIVE NEGATIVE Final   Influenza B by PCR NEGATIVE NEGATIVE Final    Comment: (NOTE) The Xpert Xpress SARS-CoV-2/FLU/RSV plus assay is intended as an aid in the diagnosis of influenza from Nasopharyngeal swab specimens and should not be used as a sole basis for treatment. Nasal washings and aspirates are unacceptable for Xpert Xpress SARS-CoV-2/FLU/RSV testing.  Fact Sheet for Patients: EntrepreneurPulse.com.au  Fact Sheet for Healthcare Providers: IncredibleEmployment.be  This test is not yet approved or cleared by the [place] FDA and has been  authorized for detection and/or diagnosis of SARS-CoV-2 by FDA under an Emergency Use Authorization (EUA). This EUA will remain in effect (meaning this test can be used) for the duration of the COVID-19 declaration under Section 564(b)(1) of the Act, 21 U.S.C. section 360bbb-3(b)(1), unless the authorization is terminated or revoked.  Performed at [facility], [address], [place], [place] [address]      Radiological Exams on Admission: DG Chest [facility] 1 View  Result Date: 07/12/2021 CLINICAL DATA:  Shortness of breath, chest pain. Diagnosed with bronchitis yesterday EXAM: PORTABLE CHEST 1 VIEW COMPARISON:  Chest radiograph dated 1 day prior FINDINGS: A tunneled right-sided central venous catheter is in stable position. Median sternotomy wires are stable. A left subclavian stent is again noted. The heart size is prominent, unchanged. The mediastinal contours are stable. There is no focal consolidation or pulmonary edema. There is no pleural effusion or pneumothorax. Overall, aeration of the lungs is unchanged. The bones are stable. IMPRESSION: Stable chest with no radiographic evidence of acute cardiopulmonary process. Electronically Signed   By: Valetta Mole M.D.   On: 07/12/2021 12:19   DG Chest Portable 1 View  Result Date: 07/11/2021 CLINICAL DATA:  Upper back pain,SOB, cough EXAM: PORTABLE CHEST 1 VIEW COMPARISON:  06/13/2021 FINDINGS: Right IJ approach central line is again noted. No new consolidation or edema. No pleural effusion. Stable cardiomediastinal contours. Left subclavian origin  stent. IMPRESSION: No acute process in the chest. Electronically Signed   By: Macy Mis M.D.   On: 07/11/2021 13:25      Assessment/Plan Active Problems:   Acute respiratory failure (HCC)    1-acute respiratory failure/bronchitis - Suspect acute flare of baseline asthma - Suspect viral process -Was COVID-negative 1 day prior - We will do formal respiratory panel - Continue with IV  Solu-Medrol, duo nebs, IV azithromycin - Supplemental oxygen as needed - Antitussive medications - Monitor respiratory status status post hemodialysis as there may been a component of volume status associated with breathing  2-SIRS - Meets criteria based on blood pressure as well as white blood cell count - Hypertension noted to be chronic and I suspect leukocytosis is likely secondary to recent steroid use - Patient overall well-appearing -We will check lactate x1 - Chest x-ray within normal limits - Consider broaden work-up if patient clinically declines - We will otherwise follow  3-coronary artery disease - Noted recent admission at the end of September for NSTEMI - Status post CABG and multiple PCI's -left heart cath, noted to have severe three-vessel CAD, culprit lesion was suspected to be severe in-stent restenosis and SVG to RCA, this was treated with balloon angioplasty 9/27 -On aspirin and Plavix daily which will be continued - Troponin in the low 20s on presentation - Suspect mild heart strain in the setting of bronchitis as well as poor renal clearance - Chest pain is more pleuritic - We will otherwise follow and continue home regimen  4-ESRD on hemodialysis Tuesday Thursday Saturday - Status post HD today - Appreciate recommendations by Dr. Marval Regal - Follow-up on formal nephrology [place] going forward  5-type 2 diabetes - Sliding scale insulin    DVT prophylaxis: SQH  Code Status: Full Code  Family Communication: No family at the bedside   Disposition Plan: Anticipate 1-2 [place] stay   Consults called: Nephrology consulted in ER prior to admission (Dr. Marval Regal)  Admission status: Obs med tele     Deneise Lever MD [facility] Pager [phone]   If 7PM-7AM, please contact night-coverage [email or web address] Password [email or web address]  07/12/2021, 9:22 PM

## 2021-07-12 NOTE — ED Triage Notes (Signed)
Shortness of breath, seen yesterday for same, diagnosed with bronchitis, c/o chest pain today

## 2021-07-12 NOTE — Consult Note (Signed)
[facility] Renal Consultation Note    Indication for Consultation:  Management of ESRD/hemodialysis; anemia, hypertension/volume and secondary hyperparathyroidism  HPI: Donna Howe is a 65 y.o. female with ESRD on HD TTS at [facility], severe CAD s/p prior CABG/multiple PCI but deemed too high risk for further bypass, T2DM, HTN, hypothyroidism, and recent admission for NSTEMI s/p PCI on 06/14/21 who presented to [facility] ED yesterday c/o cough and SOB.  She was treated with nebulizer and po antibiotics and discharged to home only to return today with worsening symptoms.  She will likely require hospitalization and we were consulted to provide HD during her hospitalization.  CXR without infiltrates and labs stable.  Past Medical History:  Diagnosis Date   Anemia    Anxiety    Asthma    CAD (coronary artery disease)    Multivessel s/p CABG 2005, numerous PCIs since that time and documented graft disease   Carotid artery disease ([place])    R CEA   ESRD on hemodialysis ([place])    Essential hypertension    Gout    History of blood transfusion    Hyperlipidemia    Hypothyroidism    Myocardial infarction [facility])    PAD (peripheral artery disease) ([place])    Dr. Kellie Simmering   Pneumonia 09/2019, 11/2019   S/P angioplasty with stent- DES to mRCA and to LIMA to LAD with DES 04/09/18.   04/10/2018   SBO (small bowel obstruction) ([place]) 2011   Status post lysis of adhesions & hernia repair   Sinus bradycardia    Type 2 diabetes mellitus ([place])    Umbilical hernia    Past Surgical History:  Procedure Laterality Date   AORTIC ARCH ANGIOGRAPHY N/A 08/02/2020   Procedure: AORTIC ARCH ANGIOGRAPHY;  Surgeon: Waynetta Sandy, MD;  Location: [place] [facility];  Service: Cardiovascular;  Laterality: N/A;  Lt upper extermity   AV FISTULA PLACEMENT Left 06/29/2020   Procedure: LEFT ARM ARTERIOVENOUS (AV) FISTULA;  Surgeon: Waynetta Sandy, MD;  Location: [place];  Service: Vascular;   Laterality: Left;  ARM   AV FISTULA PLACEMENT Right 05/11/2021   Procedure: RIGHT BRACHIOCEPHALIC ARTERIOVENOUS (AV) FISTULA CREATION;  Surgeon: Waynetta Sandy, MD;  Location: [facility] OR;  Service: Vascular;  Laterality: Right;   [place]  2010   CORONARY ARTERY BYPASS GRAFT  2005   CORONARY BALLOON ANGIOPLASTY N/A 05/31/2017   Procedure: CORONARY BALLOON ANGIOPLASTY;  Surgeon: Jettie Booze, MD;  Location: [place] [facility];  Service: Cardiovascular;  Laterality: N/A;   CORONARY BALLOON ANGIOPLASTY N/A 06/14/2021   Procedure: CORONARY BALLOON ANGIOPLASTY;  Surgeon: Jettie Booze, MD;  Location: [place] [facility];  Service: Cardiovascular;  Laterality: N/A;   CORONARY STENT INTERVENTION N/A 05/31/2017   Procedure: CORONARY STENT INTERVENTION;  Surgeon: Jettie Booze, MD;  Location: [place] [facility];  Service: Cardiovascular;  Laterality: N/A;   CORONARY STENT INTERVENTION N/A 04/09/2018   Procedure: CORONARY STENT INTERVENTION;  Surgeon: Jettie Booze, MD;  Location: [place] [facility];  Service: Cardiovascular;  Laterality: N/A;  SVG RCA   CORONARY STENT INTERVENTION N/A 01/10/2021   Procedure: CORONARY STENT INTERVENTION;  Surgeon: Leonie Man, MD;  Location: [place] [facility];  Service: Cardiovascular;  Laterality: N/A;   CORONARY/GRAFT ACUTE MI REVASCULARIZATION N/A 02/24/2021   Procedure: Coronary/Graft Acute MI Revascularization;  Surgeon: Jettie Booze, MD;  Location: [place] [facility];  Service: Cardiovascular;  Laterality: N/A;  ENDARTERECTOMY Right 04/18/2013   Procedure: ENDARTERECTOMY CAROTID;  Surgeon: Mal Misty, MD;  Location: [place];  Service: Vascular;  Laterality: Right;   HERNIA REPAIR  1989   Incisional hernia repair x2  03/04/2010   Laparoscopic with 35cm mesh by Dr Ronnald Collum   IR [place] CV LINE RIGHT  06/21/2020   IR US GUIDE VASC ACCESS RIGHT  06/21/2020   LEFT HEART CATH AND CORS/GRAFTS  ANGIOGRAPHY N/A 05/31/2017   Procedure: LEFT HEART CATH AND CORS/GRAFTS ANGIOGRAPHY;  Surgeon: Jettie Booze, MD;  Location: [place] [facility];  Service: Cardiovascular;  Laterality: N/A;   LEFT HEART CATH AND CORS/GRAFTS ANGIOGRAPHY N/A 04/08/2018   Procedure: LEFT HEART CATH AND CORS/GRAFTS ANGIOGRAPHY;  Surgeon: Jettie Booze, MD;  Location: [place] [facility];  Service: Cardiovascular;  Laterality: N/A;   LEFT HEART CATH AND CORS/GRAFTS ANGIOGRAPHY N/A 06/22/2020   Procedure: LEFT HEART CATH AND CORS/GRAFTS ANGIOGRAPHY;  Surgeon: Belva Crome, MD;  Location: [place] [facility];  Service: Cardiovascular;  Laterality: N/A;   LEFT HEART CATH AND CORS/GRAFTS ANGIOGRAPHY N/A 01/10/2021   Procedure: LEFT HEART CATH AND CORS/GRAFTS ANGIOGRAPHY;  Surgeon: Leonie Man, MD;  Location: [place] [facility];  Service: Cardiovascular;  Laterality: N/A;   LEFT HEART CATH AND CORS/GRAFTS ANGIOGRAPHY N/A 02/24/2021   Procedure: LEFT HEART CATH AND CORS/GRAFTS ANGIOGRAPHY;  Surgeon: Jettie Booze, MD;  Location: [place] [facility];  Service: Cardiovascular;  Laterality: N/A;   LEFT HEART CATH AND CORS/GRAFTS ANGIOGRAPHY N/A 06/14/2021   Procedure: LEFT HEART CATH AND CORS/GRAFTS ANGIOGRAPHY;  Surgeon: Jettie Booze, MD;  Location: [place] [facility];  Service: Cardiovascular;  Laterality: N/A;   LEFT HEART CATHETERIZATION WITH CORONARY ANGIOGRAM N/A 12/19/2012   Procedure: LEFT HEART CATHETERIZATION WITH CORONARY ANGIOGRAM;  Surgeon: Josue Hector, MD;  Location: [facility] CATH LAB;  Service: Cardiovascular;  Laterality: N/A;   LEFT HEART CATHETERIZATION WITH CORONARY/GRAFT ANGIOGRAM N/A 04/19/2013   Procedure: LEFT HEART CATHETERIZATION WITH Beatrix Fetters;  Surgeon: Lorretta Harp, MD;  Location: [facility] CATH LAB;  Service: Cardiovascular;  Laterality: N/A;   LIGATION OF ARTERIOVENOUS  FISTULA Left 09/15/2020   Procedure: LIGATION OF LEFT ARM ARTERIOVENOUS  FISTULA;  Surgeon: Waynetta Sandy, MD;  Location: [place];  Service: Vascular;  Laterality: Left;   PATCH ANGIOPLASTY Right 04/18/2013   Procedure: PATCH ANGIOPLASTY Right Internal Carotid Artery;  Surgeon: Mal Misty, MD;  Location: [place];  Service: Vascular;  Laterality: Right;   PERCUTANEOUS CORONARY STENT INTERVENTION (PCI-S) Right 12/19/2012   Procedure: PERCUTANEOUS CORONARY STENT INTERVENTION (PCI-S);  Surgeon: Josue Hector, MD;  Location: [facility] CATH LAB;  Service: Cardiovascular;  Laterality: Right;   PERIPHERAL VASCULAR INTERVENTION Left 08/02/2020   Procedure: PERIPHERAL VASCULAR INTERVENTION;  Surgeon: Waynetta Sandy, MD;  Location: [place] [facility];  Service: Cardiovascular;  Laterality: Left;  Left subclavian   PERIPHERAL VASCULAR INTERVENTION  02/24/2021   Procedure: PERIPHERAL VASCULAR INTERVENTION;  Surgeon: Marty Heck, MD;  Location: [place] [facility];  Service: Vascular;;   SHOULDER SURGERY     Family History:   Family History  Problem Relation Age of Onset   Diabetes Mother    Heart disease Mother        before age 57   Hyperlipidemia Mother    Hypertension Mother    Thyroid disease Father    Hypertension Father    AAA (abdominal aortic aneurysm) Father    Heart disease Brother  before age 31   Hypertension Brother    Hyperlipidemia Son    Hypertension Son    Social History:  reports that she has been smoking cigarettes. She has a 20.00 pack-year smoking history. She has never used smokeless tobacco. She reports that she does not drink alcohol and does not use drugs. Allergies  Allergen Reactions   Penicillins Other (See Comments)    REACTION: Unknown, told as a child Has patient had a PCN reaction causing immediate rash, facial/tongue/throat swelling, SOB or lightheadedness with hypotension: Unknown Has patient had a PCN reaction causing severe rash involving mucus membranes or skin necrosis: Unknown Has patient had a PCN reaction that required  hospitalization: Unknown Has patient had a PCN reaction occurring within the last 10 years: No If all of the above answers are "NO", then may proceed with Cephalosporin use.    Beta Adrenergic Blockers Other (See Comments)    Hypotension   Prior to Admission medications   Medication Sig Start Date End Date Taking? Authorizing Provider  Albuterol Sulfate 108 (90 Base) MCG/ACT AEPB Inhale 2 puffs into the lungs every 6 (six) hours as needed (Shortness of breath).    [provider]  allopurinol (ZYLOPRIM) 100 MG tablet Take one tablet after hemodialysis, on Tuesday, Thursday and Saturday. Patient taking differently: Take 100 mg by mouth Every Tuesday,Thursday,and Saturday with dialysis. After dialysis 06/30/20   Arrien, Jimmy Picket, MD  ALPRAZolam Duanne Moron) 0.5 MG tablet Take 0.25-0.5 mg by mouth See admin instructions. Take 0.25 in the morning and evening and 0.5 mg at bedtime    [provider]  aspirin EC 81 MG tablet Take 81 mg by mouth daily.     [provider]  atorvastatin (LIPITOR) 80 MG tablet Take 1 tablet (80 mg total) by mouth every evening. Patient taking differently: Take 80 mg by mouth at bedtime. 12/19/19   Johnson, Clanford L, MD  clopidogrel (PLAVIX) 75 MG tablet Take 1 tablet (75 mg total) by mouth daily. 08/27/18   Satira Sark, MD  cyclobenzaprine (FLEXERIL) 5 MG tablet Take 1 tablet (5 mg total) by mouth 2 (two) times daily with a meal. 06/17/21   Domenic Polite, MD  diclofenac Sodium (VOLTAREN) 1 % GEL Apply 2-4 g topically daily as needed (FOR NECK PAIN).    [provider]  doxycycline (VIBRAMYCIN) 100 MG capsule Take 1 capsule (100 mg total) by mouth 2 (two) times daily. 07/11/21   Marcello Fennel, PA-C  ezetimibe (ZETIA) 10 MG tablet Take 1 tablet (10 mg total) by mouth daily. Patient taking differently: Take 10 mg by mouth at bedtime. 02/25/21   Bhagat, Crista Luria, PA  gabapentin (NEURONTIN) 400 MG capsule Take 1 capsule  (400 mg total) by mouth at bedtime. 06/17/21   Domenic Polite, MD  ipratropium (ATROVENT HFA) 17 MCG/ACT inhaler Inhale 2 puffs into the lungs every 4 (four) hours as needed for wheezing.     [provider]  levothyroxine (SYNTHROID) 112 MCG tablet Take 112 mcg by mouth daily before breakfast.    [provider]  midodrine (PROAMATINE) 10 MG tablet Take 1 tablet (10 mg total) by mouth 2 (two) times daily with a meal. 06/17/21   Domenic Polite, MD  multivitamin (RENA-VIT) TABS tablet Take 1 tablet by mouth daily.    [provider]  nitroGLYCERIN (NITROSTAT) 0.4 MG SL tablet Place 1 tablet (0.4 mg total) under the tongue every 5 (five) minutes x 3 doses as needed for chest pain. 02/25/21  Bhagat, Bhavinkumar, PA  NOVOLIN 70/30 KWIKPEN (70-30) 100 UNIT/ML KwikPen Inject 20 Units into the skin in the morning and at bedtime. Patient taking differently: Inject 24 Units into the skin in the morning and at bedtime. 06/17/21   Domenic Polite, MD  ondansetron (ZOFRAN) 8 MG tablet Take 8 mg by mouth every 8 (eight) hours as needed for nausea. 07/12/20   [provider]  [facility] ULTRA test strip Check blood glucose THREE TIMES DAILY 03/31/21   [provider]  pantoprazole (PROTONIX) 40 MG tablet Take 1 tablet (40 mg total) by mouth 2 (two) times daily. 06/17/20   Barton Dubois, MD  predniSONE (DELTASONE) 10 MG tablet Take 3 tablets (30 mg total) by mouth daily for 5 days. 07/11/21 07/16/21  Marcello Fennel, PA-C  ranolazine (RANEXA) 500 MG 12 hr tablet Take 1 tablet (500 mg total) by mouth at bedtime. 06/17/21   Domenic Polite, MD  Vitamin D, Ergocalciferol, (DRISDOL) 1.25 MG (50000 UNIT) CAPS capsule Take 50,000 Units by mouth every Sunday.  12/31/19   [provider]   Current Facility-Administered Medications  Medication Dose Route Frequency Provider Last Rate Last Admin   acetaminophen (TYLENOL) tablet 650 mg  650 mg Oral Once Couture, Cortni S,  PA-C       albuterol (PROVENTIL,VENTOLIN) solution continuous neb  10 mg/hr Nebulization Once Couture, Cortni S, PA-C       [START ON 07/13/2021] Chlorhexidine Gluconate Cloth 2 % PADS 6 each  6 each Topical Q0600 Donato Heinz, MD       Current Outpatient Medications  Medication Sig Dispense Refill   Albuterol Sulfate 108 (90 Base) MCG/ACT AEPB Inhale 2 puffs into the lungs every 6 (six) hours as needed (Shortness of breath).     allopurinol (ZYLOPRIM) 100 MG tablet Take one tablet after hemodialysis, on Tuesday, Thursday and Saturday. (Patient taking differently: Take 100 mg by mouth Every Tuesday,Thursday,and Saturday with dialysis. After dialysis) 30 tablet 11   ALPRAZolam (XANAX) 0.5 MG tablet Take 0.25-0.5 mg by mouth See admin instructions. Take 0.25 in the morning and evening and 0.5 mg at bedtime     aspirin EC 81 MG tablet Take 81 mg by mouth daily.      atorvastatin (LIPITOR) 80 MG tablet Take 1 tablet (80 mg total) by mouth every evening. (Patient taking differently: Take 80 mg by mouth at bedtime.)     clopidogrel (PLAVIX) 75 MG tablet Take 1 tablet (75 mg total) by mouth daily. 90 tablet 3   cyclobenzaprine (FLEXERIL) 5 MG tablet Take 1 tablet (5 mg total) by mouth 2 (two) times daily with a meal. 30 tablet 0   diclofenac Sodium (VOLTAREN) 1 % GEL Apply 2-4 g topically daily as needed (FOR NECK PAIN).     doxycycline (VIBRAMYCIN) 100 MG capsule Take 1 capsule (100 mg total) by mouth 2 (two) times daily. 20 capsule 0   ezetimibe (ZETIA) 10 MG tablet Take 1 tablet (10 mg total) by mouth daily. (Patient taking differently: Take 10 mg by mouth at bedtime.) 30 tablet 11   gabapentin (NEURONTIN) 400 MG capsule Take 1 capsule (400 mg total) by mouth at bedtime.     ipratropium (ATROVENT HFA) 17 MCG/ACT inhaler Inhale 2 puffs into the lungs every 4 (four) hours as needed for wheezing.      levothyroxine (SYNTHROID) 112 MCG tablet Take 112 mcg by mouth daily before breakfast.      midodrine (PROAMATINE) 10 MG tablet Take 1 tablet (10 mg total)  by mouth 2 (two) times daily with a meal. 60 tablet 0   multivitamin (RENA-VIT) TABS tablet Take 1 tablet by mouth daily.     nitroGLYCERIN (NITROSTAT) 0.4 MG SL tablet Place 1 tablet (0.4 mg total) under the tongue every 5 (five) minutes x 3 doses as needed for chest pain. 100 tablet 3   NOVOLIN 70/30 KWIKPEN (70-30) 100 UNIT/ML KwikPen Inject 20 Units into the skin in the morning and at bedtime. (Patient taking differently: Inject 24 Units into the skin in the morning and at bedtime.)     ondansetron (ZOFRAN) 8 MG tablet Take 8 mg by mouth every 8 (eight) hours as needed for nausea.     ONETOUCH ULTRA test strip Check blood glucose THREE TIMES DAILY     pantoprazole (PROTONIX) 40 MG tablet Take 1 tablet (40 mg total) by mouth 2 (two) times daily. 60 tablet 1   predniSONE (DELTASONE) 10 MG tablet Take 3 tablets (30 mg total) by mouth daily for 5 days. 15 tablet 0   ranolazine (RANEXA) 500 MG 12 hr tablet Take 1 tablet (500 mg total) by mouth at bedtime.     Vitamin D, Ergocalciferol, (DRISDOL) 1.25 MG (50000 UNIT) CAPS capsule Take 50,000 Units by mouth every Sunday.      Labs: Basic Metabolic Panel: Recent Labs  Lab 07/11/21 1407 07/12/21 1129  NA 135 130*  K 4.4 4.8  CL 97* 94*  CO2 27 23  GLUCOSE 134* 398*  BUN 47* 63*  CREATININE 6.38* 7.27*  CALCIUM 9.4 8.6*   Liver Function Tests: Recent Labs  Lab 07/11/21 1407  AST 17  ALT 15  ALKPHOS 81  BILITOT 0.6  PROT 7.0  ALBUMIN 3.3*   No results for input(s): LIPASE, AMYLASE in the last 168 hours. No results for input(s): AMMONIA in the last 168 hours. CBC: Recent Labs  Lab 07/11/21 1407 07/12/21 1129  WBC 10.5 13.1*  NEUTROABS 6.9  --   HGB 12.1 11.0*  HCT 37.4 34.5*  MCV 108.1* 105.5*  PLT 200 175   Cardiac Enzymes: No results for input(s): CKTOTAL, CKMB, CKMBINDEX, TROPONINI in the last 168 hours. CBG: No results for input(s): GLUCAP in the last  168 hours. Iron Studies: No results for input(s): IRON, TIBC, TRANSFERRIN, FERRITIN in the last 72 hours. Studies/Results: DG Chest Port 1 View  Result Date: 07/12/2021 CLINICAL DATA:  Shortness of breath, chest pain. Diagnosed with bronchitis yesterday EXAM: PORTABLE CHEST 1 VIEW COMPARISON:  Chest radiograph dated 1 day prior FINDINGS: A tunneled right-sided central venous catheter is in stable position. Median sternotomy wires are stable. A left subclavian stent is again noted. The heart size is prominent, unchanged. The mediastinal contours are stable. There is no focal consolidation or pulmonary edema. There is no pleural effusion or pneumothorax. Overall, aeration of the lungs is unchanged. The bones are stable. IMPRESSION: Stable chest with no radiographic evidence of acute cardiopulmonary process. Electronically Signed   By: Valetta Mole M.D.   On: 07/12/2021 12:19   DG Chest Portable 1 View  Result Date: 07/11/2021 CLINICAL DATA:  Upper back pain,SOB, cough EXAM: PORTABLE CHEST 1 VIEW COMPARISON:  06/13/2021 FINDINGS: Right IJ approach central line is again noted. No new consolidation or edema. No pleural effusion. Stable cardiomediastinal contours. Left subclavian origin stent. IMPRESSION: No acute process in the chest. Electronically Signed   By: Macy Mis M.D.   On: 07/11/2021 13:25    ROS: Pertinent items are noted in HPI. Physical Exam: Vitals:  07/12/21 1130 07/12/21 1200 07/12/21 1300 07/12/21 1315  BP: 117/62 (!) 116/49  (!) 107/59  Pulse: 86 79  80  Resp: 20 16  (!) 22  Temp:      TempSrc:      SpO2: 98% 98% 98% 100%      Weight change:  No intake or output data in the 24 hours ending 07/12/21 1325 BP (!) 107/59   Pulse 80   Temp (!) 97.5 F (36.4 C) (Oral)   Resp (!) 22   SpO2 100%  General appearance: alert, cooperative, and mild distress Head: Normocephalic, without obvious abnormality, atraumatic Resp: wheezes bilaterally Cardio: regular rate and  rhythm, S1, S2 normal, no murmur, click, rub or gallop GI: soft, non-tender; bowel sounds normal; no masses,  no organomegaly Extremities: extremities normal, atraumatic, no cyanosis or edema and RUE AVF +T/B Dialysis Access:  Dialysis Orders: Center: [facility]  on TTS . EDW 94.5kg HD Bath 2K/2.5Ca  Time 4:15 Heparin 2000 units. Access RIJ TDC, maturing RUE AVF BFR 400 DFR 800    Micera 50 mcg IVP every 4 weeks  Assessment/Plan:  Shortness of breath with cough and congestion - respiratory panel negative.  Failed outpatient therapy.  Plan for admission and treatment per primary svc  ESRD -  continue with HD on TTS schedule  Hypertension/volume  - UF as tolerated  Anemia  - stable, no ESA  Metabolic bone disease -  resume home meds  Nutrition - renal diet, carb modified CAD s/p recent NSTEMI and PCI- stable at present.  Vascular access - maturing RUE AVF, wrist band in place  Donetta Potts, MD [place] Pager [phone] 07/12/2021, 1:25 PM

## 2021-07-12 NOTE — ED Notes (Signed)
C/o feeling hot, cool wash clothes provided for pt.

## 2021-07-12 NOTE — ED Notes (Signed)
Restriction bracelet placed to right arm, HD on Tuesday, Thursday and Saturday.  + thrill and bruit

## 2021-07-12 NOTE — ED Notes (Signed)
Soda and peanut butter crackers provided for pt

## 2021-07-12 NOTE — ED Provider Notes (Signed)
[facility] EMERGENCY DEPARTMENT Provider Note   CSN: [id number] Arrival date & time: 07/12/21  1048     History Chief Complaint  Patient presents with   Shortness of Breath   Chest Pain    Donna Howe is a 65 y.o. female.  HPI   65 y/o female with a h/o anemia, anxiety, asthma, CAD, ESRD on dialysis, HTN, gout, HLD, MI, PAD, pneumonia, SBO, bradycardia, DM, umbilical hernia, who presents to the ED today for eval of SOB that started 6 days ago. States she took OTC meds without relief. She was seen in the ED yesterday and dx with bronchitis. Yesterday she was given a duo neb and an hour long neb. She sent home with prednisone and abx. She initially felt some improvement after treatment however her sob has worsened since she was discharged home. She currently denies any chest pain, but she does state that when she coughs a lot she has she chest discomfort. Denies fevers. Has had rhinorrhea, congestion. Denies ble swelling.  She goes to dialysis T/Th/Sat. She denies any recently missed dialysis.   Past Medical History:  Diagnosis Date   Anemia    Anxiety    Asthma    CAD (coronary artery disease)    Multivessel s/p CABG 2005, numerous PCIs since that time and documented graft disease   Carotid artery disease ([place])    R CEA   ESRD on hemodialysis ([place])    Essential hypertension    Gout    History of blood transfusion    Hyperlipidemia    Hypothyroidism    Myocardial infarction [facility])    PAD (peripheral artery disease) ([place])    Dr. Kellie Simmering   Pneumonia 09/2019, 11/2019   S/P angioplasty with stent- DES to mRCA and to LIMA to LAD with DES 04/09/18.   04/10/2018   SBO (small bowel obstruction) ([place]) 2011   Status post lysis of adhesions & hernia repair   Sinus bradycardia    Type 2 diabetes mellitus ([place])    Umbilical hernia     Patient Active Problem List   Diagnosis Date Noted   Pulmonary edema 06/14/2021   Stenosis of left subclavian artery (HCC)    Leukocytosis  01/09/2021   Elevated MCV 01/09/2021   Diabetic neuropathy ([place]) 01/09/2021   Unspecified protein-calorie malnutrition ([place]) 07/05/2020   Allergy, unspecified, initial encounter 06/30/2020   Anaphylactic shock, unspecified, initial encounter 06/30/2020   Anemia in chronic kidney disease 06/30/2020   Coagulation defect, unspecified ([place]) 06/30/2020   Diarrhea, unspecified 06/30/2020   Encounter for immunization 06/30/2020   Iron deficiency anemia, unspecified 06/30/2020   Pain, unspecified 06/30/2020   PAD (peripheral artery disease) ([place]) 06/30/2020   Pruritus, unspecified 06/30/2020   Secondary hyperparathyroidism of renal origin ([place]) 06/30/2020   ESRD on dialysis (HCC)    SOB (shortness of breath)    Chronic diastolic HF (heart failure) (HCC)    GERD (gastroesophageal reflux disease)    Acute renal failure superimposed on stage 5 chronic kidney disease, not on chronic dialysis ([place]) 06/13/2020   Hyperglycemia due to diabetes mellitus ([place]) 06/13/2020   Pneumonia 12/17/2019   Cough variant asthma with component of UACS 11/20/2019   Elevated troponin I level 10/02/2019   Chest pain 10/01/2019   Unstable angina ([place]) 04/18/2018   Hypothyroidism (acquired) 04/18/2018   S/P angioplasty with stent- DES to [facility] and to LIMA to LAD with DES 04/09/18.   04/10/2018   Angina pectoris ([place]) 04/05/2018   Status post coronary artery  stent placement    Acute coronary syndrome ([place]) 05/31/2017   Acute chest pain    NSTEMI (non-ST elevated myocardial infarction) ([place])    History of coronary artery disease    Diabetes mellitus with ESRD (end-stage renal disease) ([place]) 10/28/2013   Hypoglycemia associated with diabetes ([place]) 10/28/2013   Thrombocytopenia, unspecified ([place]) 10/28/2013   Hypokalemia 10/27/2013   Syncope 10/26/2013   Anemia 10/26/2013   AKI (acute kidney injury) ([place]) 10/26/2013   Fracture of toe of right foot 42/70/6237   Umbilical hernia    Carotid artery disease ([place])     Occlusion and stenosis of carotid artery without mention of cerebral infarction 04/15/2013   Hx of CABG    Ejection fraction    Atherosclerosis of native artery of extremity with intermittent claudication ([place]) 02/11/2013   Diabetes mellitus with renal manifestation ([place]) 01/21/2013   Bradycardia 01/03/2013   Chronic kidney disease (CKD), stage III (moderate) (HCC)    Gout    PROTEINURIA, MILD 01/18/2010   Obesity (BMI 30-39.9) 08/26/2009   Asthma 08/26/2009   Mixed hyperlipidemia 03/31/2007   Essential hypertension 03/31/2007   CAD (coronary artery disease) 03/31/2007    Past Surgical History:  Procedure Laterality Date   AORTIC ARCH ANGIOGRAPHY N/A 08/02/2020   Procedure: AORTIC ARCH ANGIOGRAPHY;  Surgeon: Waynetta Sandy, MD;  Location: [place] CV LAB;  Service: Cardiovascular;  Laterality: N/A;  Lt upper extermity   AV FISTULA PLACEMENT Left 06/29/2020   Procedure: LEFT ARM ARTERIOVENOUS (AV) FISTULA;  Surgeon: Waynetta Sandy, MD;  Location: [place];  Service: Vascular;  Laterality: Left;  ARM   AV FISTULA PLACEMENT Right 05/11/2021   Procedure: RIGHT BRACHIOCEPHALIC ARTERIOVENOUS (AV) FISTULA CREATION;  Surgeon: Waynetta Sandy, MD;  Location: [facility] OR;  Service: Vascular;  Laterality: Right;   [place]  2010   CORONARY ARTERY BYPASS GRAFT  2005   CORONARY BALLOON ANGIOPLASTY N/A 05/31/2017   Procedure: CORONARY BALLOON ANGIOPLASTY;  Surgeon: Jettie Booze, MD;  Location: [place] CV LAB;  Service: Cardiovascular;  Laterality: N/A;   CORONARY BALLOON ANGIOPLASTY N/A 06/14/2021   Procedure: CORONARY BALLOON ANGIOPLASTY;  Surgeon: Jettie Booze, MD;  Location: [place] CV LAB;  Service: Cardiovascular;  Laterality: N/A;   CORONARY STENT INTERVENTION N/A 05/31/2017   Procedure: CORONARY STENT INTERVENTION;  Surgeon: Jettie Booze, MD;  Location: [place] CV LAB;  Service: Cardiovascular;   Laterality: N/A;   CORONARY STENT INTERVENTION N/A 04/09/2018   Procedure: CORONARY STENT INTERVENTION;  Surgeon: Jettie Booze, MD;  Location: [place] CV LAB;  Service: Cardiovascular;  Laterality: N/A;  SVG RCA   CORONARY STENT INTERVENTION N/A 01/10/2021   Procedure: CORONARY STENT INTERVENTION;  Surgeon: Leonie Man, MD;  Location: [place] CV LAB;  Service: Cardiovascular;  Laterality: N/A;   CORONARY/GRAFT ACUTE MI REVASCULARIZATION N/A 02/24/2021   Procedure: Coronary/Graft Acute MI Revascularization;  Surgeon: Jettie Booze, MD;  Location: [place] CV LAB;  Service: Cardiovascular;  Laterality: N/A;   ENDARTERECTOMY Right 04/18/2013   Procedure: ENDARTERECTOMY CAROTID;  Surgeon: Mal Misty, MD;  Location: [place];  Service: Vascular;  Laterality: Right;   HERNIA REPAIR  1989   Incisional hernia repair x2  03/04/2010   Laparoscopic with 35cm mesh by Dr Ronnald Collum   IR [place] CV LINE RIGHT  06/21/2020   IR US GUIDE VASC ACCESS RIGHT  06/21/2020   LEFT HEART CATH AND CORS/GRAFTS ANGIOGRAPHY N/A 05/31/2017  Procedure: LEFT HEART CATH AND CORS/GRAFTS ANGIOGRAPHY;  Surgeon: Jettie Booze, MD;  Location: [place] CV LAB;  Service: Cardiovascular;  Laterality: N/A;   LEFT HEART CATH AND CORS/GRAFTS ANGIOGRAPHY N/A 04/08/2018   Procedure: LEFT HEART CATH AND CORS/GRAFTS ANGIOGRAPHY;  Surgeon: Jettie Booze, MD;  Location: [place] CV LAB;  Service: Cardiovascular;  Laterality: N/A;   LEFT HEART CATH AND CORS/GRAFTS ANGIOGRAPHY N/A 06/22/2020   Procedure: LEFT HEART CATH AND CORS/GRAFTS ANGIOGRAPHY;  Surgeon: Belva Crome, MD;  Location: [place] CV LAB;  Service: Cardiovascular;  Laterality: N/A;   LEFT HEART CATH AND CORS/GRAFTS ANGIOGRAPHY N/A 01/10/2021   Procedure: LEFT HEART CATH AND CORS/GRAFTS ANGIOGRAPHY;  Surgeon: Leonie Man, MD;  Location: [place] CV LAB;  Service: Cardiovascular;  Laterality: N/A;   LEFT HEART CATH AND  CORS/GRAFTS ANGIOGRAPHY N/A 02/24/2021   Procedure: LEFT HEART CATH AND CORS/GRAFTS ANGIOGRAPHY;  Surgeon: Jettie Booze, MD;  Location: [place] CV LAB;  Service: Cardiovascular;  Laterality: N/A;   LEFT HEART CATH AND CORS/GRAFTS ANGIOGRAPHY N/A 06/14/2021   Procedure: LEFT HEART CATH AND CORS/GRAFTS ANGIOGRAPHY;  Surgeon: Jettie Booze, MD;  Location: [place] CV LAB;  Service: Cardiovascular;  Laterality: N/A;   LEFT HEART CATHETERIZATION WITH CORONARY ANGIOGRAM N/A 12/19/2012   Procedure: LEFT HEART CATHETERIZATION WITH CORONARY ANGIOGRAM;  Surgeon: Josue Hector, MD;  Location: [facility] CATH LAB;  Service: Cardiovascular;  Laterality: N/A;   LEFT HEART CATHETERIZATION WITH CORONARY/GRAFT ANGIOGRAM N/A 04/19/2013   Procedure: LEFT HEART CATHETERIZATION WITH Beatrix Fetters;  Surgeon: Lorretta Harp, MD;  Location: [facility] CATH LAB;  Service: Cardiovascular;  Laterality: N/A;   LIGATION OF ARTERIOVENOUS  FISTULA Left 09/15/2020   Procedure: LIGATION OF LEFT ARM ARTERIOVENOUS  FISTULA;  Surgeon: Waynetta Sandy, MD;  Location: [place];  Service: Vascular;  Laterality: Left;   PATCH ANGIOPLASTY Right 04/18/2013   Procedure: PATCH ANGIOPLASTY Right Internal Carotid Artery;  Surgeon: Mal Misty, MD;  Location: [place];  Service: Vascular;  Laterality: Right;   PERCUTANEOUS CORONARY STENT INTERVENTION (PCI-S) Right 12/19/2012   Procedure: PERCUTANEOUS CORONARY STENT INTERVENTION (PCI-S);  Surgeon: Josue Hector, MD;  Location: [facility] CATH LAB;  Service: Cardiovascular;  Laterality: Right;   PERIPHERAL VASCULAR INTERVENTION Left 08/02/2020   Procedure: PERIPHERAL VASCULAR INTERVENTION;  Surgeon: Waynetta Sandy, MD;  Location: [place] CV LAB;  Service: Cardiovascular;  Laterality: Left;  Left subclavian   PERIPHERAL VASCULAR INTERVENTION  02/24/2021   Procedure: PERIPHERAL VASCULAR INTERVENTION;  Surgeon: Marty Heck, MD;  Location: [place] CV LAB;  Service:  Vascular;;   SHOULDER SURGERY       OB History   No obstetric history on file.     Family History  Problem Relation Age of Onset   Diabetes Mother    Heart disease Mother        before age 22   Hyperlipidemia Mother    Hypertension Mother    Thyroid disease Father    Hypertension Father    AAA (abdominal aortic aneurysm) Father    Heart disease Brother        before age 73   Hypertension Brother    Hyperlipidemia Son    Hypertension Son     Social History   Tobacco Use   Smoking status: Every Day    Packs/day: 1.00    Years: 20.00    Pack years: 20.00    Types: Cigarettes    Last attempt to quit: 12/10/2012  Years since quitting: 8.5   Smokeless tobacco: Never  Vaping Use   Vaping Use: Never used  Substance Use Topics   Alcohol use: No    Alcohol/week: 0.0 standard drinks   Drug use: No    Home Medications Prior to Admission medications   Medication Sig Start Date End Date Taking? Authorizing Provider  Albuterol Sulfate 108 (90 Base) MCG/ACT AEPB Inhale 2 puffs into the lungs every 6 (six) hours as needed (Shortness of breath).    [provider]  allopurinol (ZYLOPRIM) 100 MG tablet Take one tablet after hemodialysis, on Tuesday, Thursday and Saturday. Patient taking differently: Take 100 mg by mouth Every Tuesday,Thursday,and Saturday with dialysis. After dialysis 06/30/20   Arrien, Jimmy Picket, MD  ALPRAZolam Duanne Moron) 0.5 MG tablet Take 0.25-0.5 mg by mouth See admin instructions. Take 0.25 in the morning and evening and 0.5 mg at bedtime    [provider]  aspirin EC 81 MG tablet Take 81 mg by mouth daily.     [provider]  atorvastatin (LIPITOR) 80 MG tablet Take 1 tablet (80 mg total) by mouth every evening. Patient taking differently: Take 80 mg by mouth at bedtime. 12/19/19   Johnson, Clanford L, MD  clopidogrel (PLAVIX) 75 MG tablet Take 1 tablet (75 mg total) by mouth daily. 08/27/18   Satira Sark, MD   cyclobenzaprine (FLEXERIL) 5 MG tablet Take 1 tablet (5 mg total) by mouth 2 (two) times daily with a meal. 06/17/21   Domenic Polite, MD  diclofenac Sodium (VOLTAREN) 1 % GEL Apply 2-4 g topically daily as needed (FOR NECK PAIN).    [provider]  doxycycline (VIBRAMYCIN) 100 MG capsule Take 1 capsule (100 mg total) by mouth 2 (two) times daily. 07/11/21   Marcello Fennel, PA-C  ezetimibe (ZETIA) 10 MG tablet Take 1 tablet (10 mg total) by mouth daily. Patient taking differently: Take 10 mg by mouth at bedtime. 02/25/21   Bhagat, Crista Luria, PA  gabapentin (NEURONTIN) 400 MG capsule Take 1 capsule (400 mg total) by mouth at bedtime. 06/17/21   Domenic Polite, MD  ipratropium (ATROVENT HFA) 17 MCG/ACT inhaler Inhale 2 puffs into the lungs every 4 (four) hours as needed for wheezing.     [provider]  levothyroxine (SYNTHROID) 112 MCG tablet Take 112 mcg by mouth daily before breakfast.    [provider]  midodrine (PROAMATINE) 10 MG tablet Take 1 tablet (10 mg total) by mouth 2 (two) times daily with a meal. 06/17/21   Domenic Polite, MD  multivitamin (RENA-VIT) TABS tablet Take 1 tablet by mouth daily.    [provider]  nitroGLYCERIN (NITROSTAT) 0.4 MG SL tablet Place 1 tablet (0.4 mg total) under the tongue every 5 (five) minutes x 3 doses as needed for chest pain. 02/25/21   Bhagat, Bhavinkumar, PA  NOVOLIN 70/30 KWIKPEN (70-30) 100 UNIT/ML KwikPen Inject 20 Units into the skin in the morning and at bedtime. Patient taking differently: Inject 24 Units into the skin in the morning and at bedtime. 06/17/21   Domenic Polite, MD  ondansetron (ZOFRAN) 8 MG tablet Take 8 mg by mouth every 8 (eight) hours as needed for nausea. 07/12/20   [provider]  [facility] ULTRA test strip Check blood glucose THREE TIMES DAILY 03/31/21   [provider]  pantoprazole (PROTONIX) 40 MG tablet Take 1 tablet (40 mg total) by mouth 2 (two) times daily.  06/17/20   Barton Dubois, MD  predniSONE (DELTASONE) 10 MG  tablet Take 3 tablets (30 mg total) by mouth daily for 5 days. 07/11/21 07/16/21  Marcello Fennel, PA-C  ranolazine (RANEXA) 500 MG 12 hr tablet Take 1 tablet (500 mg total) by mouth at bedtime. 06/17/21   Domenic Polite, MD  Vitamin D, Ergocalciferol, (DRISDOL) 1.25 MG (50000 UNIT) CAPS capsule Take 50,000 Units by mouth every Sunday.  12/31/19   [provider]    Allergies    Penicillins and Beta adrenergic blockers  Review of Systems   Review of Systems  Constitutional:  Negative for fever.  HENT:  Positive for congestion and rhinorrhea. Negative for ear pain and sore throat.   Eyes:  Negative for pain and visual disturbance.  Respiratory:  Positive for cough and shortness of breath.   Cardiovascular:  Negative for chest pain, palpitations and leg swelling.  Gastrointestinal:  Negative for abdominal pain, constipation, diarrhea, nausea and vomiting.  Genitourinary:  Negative for dysuria and hematuria.  Musculoskeletal:  Negative for back pain.  Skin:  Negative for color change and rash.  Neurological:  Negative for seizures and syncope.  All other systems reviewed and are negative.  Physical Exam Updated Vital Signs BP 124/66 (BP Location: Left Arm)   Pulse 87   Temp (!) 97.5 F (36.4 C) (Oral)   Resp 19   SpO2 90%   Physical Exam Vitals and nursing note reviewed.  Constitutional:      General: She is not in acute distress.    Appearance: She is well-developed.  HENT:     Head: Normocephalic and atraumatic.  Eyes:     Conjunctiva/sclera: Conjunctivae normal.  Cardiovascular:     Rate and Rhythm: Normal rate and regular rhythm.     Heart sounds: Normal heart sounds. No murmur heard. Pulmonary:     Effort: Pulmonary effort is normal. No respiratory distress.     Breath sounds: Wheezing present.  Abdominal:     Palpations: Abdomen is soft.     Tenderness: There is no abdominal tenderness.   Musculoskeletal:     Cervical back: Neck supple.  Skin:    General: Skin is warm and dry.  Neurological:     Mental Status: She is alert.    ED Results / Procedures / Treatments   Labs (all labs ordered are listed, but only abnormal results are displayed) Labs Reviewed  BASIC METABOLIC PANEL - Abnormal; Notable for the following components:      Result Value   Sodium 130 (*)    Chloride 94 (*)    Glucose, Bld 398 (*)    BUN 63 (*)    Creatinine, Ser 7.27 (*)    Calcium 8.6 (*)    GFR, Estimated 6 (*)    All other components within normal limits  CBC - Abnormal; Notable for the following components:   WBC 13.1 (*)    RBC 3.27 (*)    Hemoglobin 11.0 (*)    HCT 34.5 (*)    MCV 105.5 (*)    RDW 15.9 (*)    All other components within normal limits  RENAL FUNCTION PANEL - Abnormal; Notable for the following components:   Sodium 133 (*)    Chloride 94 (*)    Glucose, Bld 368 (*)    BUN 67 (*)    Creatinine, Ser 7.26 (*)    Phosphorus 6.7 (*)    Albumin 3.4 (*)    GFR, Estimated 6 (*)    All other components within normal limits  CBC - Abnormal;  Notable for the following components:   WBC 15.8 (*)    RBC 3.40 (*)    Hemoglobin 11.5 (*)    MCV 106.8 (*)    RDW 15.8 (*)    All other components within normal limits  RENAL FUNCTION PANEL - Abnormal; Notable for the following components:   Sodium 133 (*)    Chloride 94 (*)    Glucose, Bld 369 (*)    BUN 66 (*)    Creatinine, Ser 7.28 (*)    Phosphorus 6.9 (*)    Albumin 3.3 (*)    GFR, Estimated 6 (*)    All other components within normal limits  TROPONIN I (HIGH SENSITIVITY) - Abnormal; Notable for the following components:   Troponin I (High Sensitivity) 21 (*)    All other components within normal limits  TROPONIN I (HIGH SENSITIVITY) - Abnormal; Notable for the following components:   Troponin I (High Sensitivity) 26 (*)    All other components within normal limits  HEPATITIS B SURFACE ANTIGEN  HEPATITIS B  SURFACE ANTIBODY,QUALITATIVE  HEPATITIS B SURFACE ANTIBODY, QUANTITATIVE    EKG None  Radiology DG Chest Port 1 View  Result Date: 07/12/2021 CLINICAL DATA:  Shortness of breath, chest pain. Diagnosed with bronchitis yesterday EXAM: PORTABLE CHEST 1 VIEW COMPARISON:  Chest radiograph dated 1 day prior FINDINGS: A tunneled right-sided central venous catheter is in stable position. Median sternotomy wires are stable. A left subclavian stent is again noted. The heart size is prominent, unchanged. The mediastinal contours are stable. There is no focal consolidation or pulmonary edema. There is no pleural effusion or pneumothorax. Overall, aeration of the lungs is unchanged. The bones are stable. IMPRESSION: Stable chest with no radiographic evidence of acute cardiopulmonary process. Electronically Signed   By: Valetta Mole M.D.   On: 07/12/2021 12:19   DG Chest Portable 1 View  Result Date: 07/11/2021 CLINICAL DATA:  Upper back pain,SOB, cough EXAM: PORTABLE CHEST 1 VIEW COMPARISON:  06/13/2021 FINDINGS: Right IJ approach central line is again noted. No new consolidation or edema. No pleural effusion. Stable cardiomediastinal contours. Left subclavian origin stent. IMPRESSION: No acute process in the chest. Electronically Signed   By: Macy Mis M.D.   On: 07/11/2021 13:25    Procedures Procedures   CRITICAL CARE Performed by: Rodney Booze   Total critical care time: 36 minutes  Critical care time was exclusive of separately billable procedures and treating other patients.  Critical care was necessary to treat or prevent imminent or life-threatening deterioration.  Critical care was time spent personally by me on the following activities: development of treatment plan with patient and/or surrogate as well as nursing, discussions with consultants, evaluation of patient's response to treatment, examination of patient, obtaining history from patient or surrogate, ordering and  performing treatments and interventions, ordering and review of laboratory studies, ordering and review of radiographic studies, pulse oximetry and re-evaluation of patient's condition.   Medications Ordered in ED Medications  albuterol (PROVENTIL,VENTOLIN) solution continuous neb ( Nebulization Canceled Entry 07/12/21 1257)  acetaminophen (TYLENOL) tablet 650 mg (650 mg Oral Patient Refused/Not Given 07/12/21 1434)  Chlorhexidine Gluconate Cloth 2 % PADS 6 each (has no administration in time range)  pentafluoroprop-tetrafluoroeth (GEBAUERS) aerosol 1 application (has no administration in time range)  lidocaine (PF) (XYLOCAINE) 1 % injection 5 mL (has no administration in time range)  lidocaine-prilocaine (EMLA) cream 1 application (has no administration in time range)  0.9 %  sodium chloride infusion (has no administration  in time range)  0.9 %  sodium chloride infusion (has no administration in time range)  heparin injection 1,000 Units (has no administration in time range)  alteplase (CATHFLO ACTIVASE) injection 2 mg (has no administration in time range)  heparin injection 1,900 Units (has no administration in time range)  methylPREDNISolone sodium succinate (SOLU-MEDROL) 125 mg/2 mL injection 125 mg (125 mg Intravenous Given 07/12/21 1312)  ipratropium (ATROVENT) nebulizer solution 0.5 mg (0.5 mg Nebulization Given 07/12/21 1257)  albuterol (PROVENTIL) (2.5 MG/3ML) 0.083% nebulizer solution (  Given 07/12/21 1257)    ED Course  I have reviewed the triage vital signs and the nursing notes.  Pertinent labs & imaging results that were available during my care of the patient were reviewed by me and considered in my medical decision making (see chart for details).    MDM Rules/Calculators/A&P                          65 y/o female presenting for SOB. Seen here yesterday and dx with bronchitis. Feels worse today.   Reviewed/interpreted labs CBC with mild leukocytosis, anemia present  but appears stable from baseline BMP with no significant electrolyte derangements, elevated BS w/o elevated anion gap and normal bicarb, BUN/Cr elevated consistent with  Trop marginally elevated at 21, 26, - suspect related to ckd  EKG with nsr, sttwave abnormality, consider inferior ischemia, prolonged ct  CXR - No acute process in the chest.  Pt was taken to dialysis.   Pt was given steroids and a continuous neb. We will plan for admission given failure of outpatient treatment.   7:17 PM CONSULT with Dr. Ernestina Patches who accepts patient for admission   Final Clinical Impression(s) / ED Diagnoses Final diagnoses:  Exacerbation of asthma, unspecified asthma severity, unspecified whether persistent    Rx / DC Orders ED Discharge Orders     None        Rodney Booze, PA-C 07/12/21 1920    Lorelle Gibbs, DO 07/13/21 [id number]

## 2021-07-13 DIAGNOSIS — R6510 Systemic inflammatory response syndrome (SIRS) of non-infectious origin without acute organ dysfunction: Secondary | ICD-10-CM | POA: Diagnosis not present

## 2021-07-13 DIAGNOSIS — N186 End stage renal disease: Secondary | ICD-10-CM | POA: Diagnosis not present

## 2021-07-13 DIAGNOSIS — J209 Acute bronchitis, unspecified: Secondary | ICD-10-CM | POA: Diagnosis present

## 2021-07-13 DIAGNOSIS — E1151 Type 2 diabetes mellitus with diabetic peripheral angiopathy without gangrene: Secondary | ICD-10-CM | POA: Diagnosis not present

## 2021-07-13 DIAGNOSIS — I9589 Other hypotension: Secondary | ICD-10-CM | POA: Diagnosis not present

## 2021-07-13 DIAGNOSIS — E1165 Type 2 diabetes mellitus with hyperglycemia: Secondary | ICD-10-CM | POA: Diagnosis not present

## 2021-07-13 DIAGNOSIS — J441 Chronic obstructive pulmonary disease with (acute) exacerbation: Secondary | ICD-10-CM | POA: Diagnosis present

## 2021-07-13 DIAGNOSIS — I132 Hypertensive heart and chronic kidney disease with heart failure and with stage 5 chronic kidney disease, or end stage renal disease: Secondary | ICD-10-CM | POA: Diagnosis not present

## 2021-07-13 DIAGNOSIS — J449 Chronic obstructive pulmonary disease, unspecified: Secondary | ICD-10-CM | POA: Diagnosis present

## 2021-07-13 DIAGNOSIS — J440 Chronic obstructive pulmonary disease with acute lower respiratory infection: Secondary | ICD-10-CM | POA: Diagnosis not present

## 2021-07-13 DIAGNOSIS — J45901 Unspecified asthma with (acute) exacerbation: Secondary | ICD-10-CM

## 2021-07-13 DIAGNOSIS — R918 Other nonspecific abnormal finding of lung field: Secondary | ICD-10-CM | POA: Diagnosis not present

## 2021-07-13 DIAGNOSIS — Z794 Long term (current) use of insulin: Secondary | ICD-10-CM

## 2021-07-13 DIAGNOSIS — N250 Renal osteodystrophy: Secondary | ICD-10-CM | POA: Diagnosis not present

## 2021-07-13 DIAGNOSIS — I1311 Hypertensive heart and chronic kidney disease without heart failure, with stage 5 chronic kidney disease, or end stage renal disease: Secondary | ICD-10-CM | POA: Diagnosis not present

## 2021-07-13 DIAGNOSIS — J42 Unspecified chronic bronchitis: Secondary | ICD-10-CM | POA: Diagnosis not present

## 2021-07-13 DIAGNOSIS — M898X9 Other specified disorders of bone, unspecified site: Secondary | ICD-10-CM | POA: Diagnosis present

## 2021-07-13 DIAGNOSIS — I2510 Atherosclerotic heart disease of native coronary artery without angina pectoris: Secondary | ICD-10-CM | POA: Diagnosis not present

## 2021-07-13 DIAGNOSIS — R911 Solitary pulmonary nodule: Secondary | ICD-10-CM | POA: Diagnosis not present

## 2021-07-13 DIAGNOSIS — Z79899 Other long term (current) drug therapy: Secondary | ICD-10-CM | POA: Diagnosis not present

## 2021-07-13 DIAGNOSIS — J9600 Acute respiratory failure, unspecified whether with hypoxia or hypercapnia: Secondary | ICD-10-CM | POA: Diagnosis not present

## 2021-07-13 DIAGNOSIS — I5032 Chronic diastolic (congestive) heart failure: Secondary | ICD-10-CM | POA: Diagnosis present

## 2021-07-13 DIAGNOSIS — R0602 Shortness of breath: Secondary | ICD-10-CM | POA: Diagnosis not present

## 2021-07-13 DIAGNOSIS — E039 Hypothyroidism, unspecified: Secondary | ICD-10-CM | POA: Diagnosis present

## 2021-07-13 DIAGNOSIS — Z20822 Contact with and (suspected) exposure to covid-19: Secondary | ICD-10-CM | POA: Diagnosis not present

## 2021-07-13 DIAGNOSIS — E119 Type 2 diabetes mellitus without complications: Secondary | ICD-10-CM

## 2021-07-13 DIAGNOSIS — I700 Atherosclerosis of aorta: Secondary | ICD-10-CM | POA: Diagnosis not present

## 2021-07-13 DIAGNOSIS — N2581 Secondary hyperparathyroidism of renal origin: Secondary | ICD-10-CM | POA: Diagnosis not present

## 2021-07-13 DIAGNOSIS — Z8249 Family history of ischemic heart disease and other diseases of the circulatory system: Secondary | ICD-10-CM | POA: Diagnosis not present

## 2021-07-13 DIAGNOSIS — T380X5A Adverse effect of glucocorticoids and synthetic analogues, initial encounter: Secondary | ICD-10-CM | POA: Diagnosis not present

## 2021-07-13 DIAGNOSIS — Z6836 Body mass index (BMI) 36.0-36.9, adult: Secondary | ICD-10-CM | POA: Diagnosis not present

## 2021-07-13 DIAGNOSIS — E1122 Type 2 diabetes mellitus with diabetic chronic kidney disease: Secondary | ICD-10-CM | POA: Diagnosis not present

## 2021-07-13 DIAGNOSIS — I252 Old myocardial infarction: Secondary | ICD-10-CM | POA: Diagnosis not present

## 2021-07-13 DIAGNOSIS — D631 Anemia in chronic kidney disease: Secondary | ICD-10-CM | POA: Diagnosis not present

## 2021-07-13 DIAGNOSIS — E1129 Type 2 diabetes mellitus with other diabetic kidney complication: Secondary | ICD-10-CM | POA: Diagnosis not present

## 2021-07-13 DIAGNOSIS — E669 Obesity, unspecified: Secondary | ICD-10-CM | POA: Diagnosis present

## 2021-07-13 DIAGNOSIS — Z992 Dependence on renal dialysis: Secondary | ICD-10-CM | POA: Diagnosis not present

## 2021-07-13 LAB — RESPIRATORY PANEL BY PCR

## 2021-07-13 LAB — CBC
HCT: 31.3 % — ABNORMAL LOW (ref 36.0–46.0)
Hemoglobin: 10.1 g/dL — ABNORMAL LOW (ref 12.0–15.0)
MCH: 34.6 pg — ABNORMAL HIGH (ref 26.0–34.0)
MCHC: 32.3 g/dL (ref 30.0–36.0)
MCV: 107.2 fL — ABNORMAL HIGH (ref 80.0–100.0)
Platelets: 204 10*3/uL (ref 150–400)
RBC: 2.92 MIL/uL — ABNORMAL LOW (ref 3.87–5.11)
RDW: 15.9 % — ABNORMAL HIGH (ref 11.5–15.5)
WBC: 14.9 10*3/uL — ABNORMAL HIGH (ref 4.0–10.5)
nRBC: 0 % (ref 0.0–0.2)

## 2021-07-13 LAB — BASIC METABOLIC PANEL
Anion gap: 14 (ref 5–15)
BUN: 25 mg/dL — ABNORMAL HIGH (ref 8–23)
CO2: 24 mmol/L (ref 22–32)
Calcium: 8.4 mg/dL — ABNORMAL LOW (ref 8.9–10.3)
Chloride: 91 mmol/L — ABNORMAL LOW (ref 98–111)
Creatinine, Ser: 3.72 mg/dL — ABNORMAL HIGH (ref 0.44–1.00)
GFR, Estimated: 13 mL/min — ABNORMAL LOW (ref >60)
Glucose, Bld: 500 mg/dL — ABNORMAL HIGH (ref 70–99)
Potassium: 4.1 mmol/L (ref 3.5–5.1)
Sodium: 129 mmol/L — ABNORMAL LOW (ref 135–145)

## 2021-07-13 LAB — COMPREHENSIVE METABOLIC PANEL
ALT: 17 U/L (ref 0–44)
AST: 26 U/L (ref 15–41)
Albumin: 3 g/dL — ABNORMAL LOW (ref 3.5–5.0)
Alkaline Phosphatase: 70 U/L (ref 38–126)
Anion gap: 12 (ref 5–15)
BUN: 30 mg/dL — ABNORMAL HIGH (ref 8–23)
CO2: 26 mmol/L (ref 22–32)
Calcium: 8.6 mg/dL — ABNORMAL LOW (ref 8.9–10.3)
Chloride: 91 mmol/L — ABNORMAL LOW (ref 98–111)
Creatinine, Ser: 4.02 mg/dL — ABNORMAL HIGH (ref 0.44–1.00)
GFR, Estimated: 12 mL/min — ABNORMAL LOW (ref >60)
Glucose, Bld: 491 mg/dL — ABNORMAL HIGH (ref 70–99)
Potassium: 4.4 mmol/L (ref 3.5–5.1)
Sodium: 129 mmol/L — ABNORMAL LOW (ref 135–145)
Total Bilirubin: 0.5 mg/dL (ref 0.3–1.2)
Total Protein: 6.5 g/dL (ref 6.5–8.1)

## 2021-07-13 LAB — GLUCOSE, CAPILLARY
Glucose-Capillary: 361 mg/dL — ABNORMAL HIGH (ref 70–99)
Glucose-Capillary: 384 mg/dL — ABNORMAL HIGH (ref 70–99)
Glucose-Capillary: 123 mg/dL — ABNORMAL HIGH (ref 70–99)
Glucose-Capillary: 239 mg/dL — ABNORMAL HIGH (ref 70–99)

## 2021-07-13 LAB — HEPATITIS B SURFACE ANTIBODY, QUANTITATIVE: Hep B S AB Quant (Post): <3.1 m[IU]/mL — ABNORMAL LOW (ref >9.9)

## 2021-07-13 LAB — LACTIC ACID, PLASMA: Lactic Acid, Venous: 1.5 mmol/L (ref 0.5–1.9)

## 2021-07-13 MED ORDER — GUAIFENESIN ER 600 MG PO TB12
600.0000 mg | ORAL_TABLET | Freq: Two times a day (BID) | ORAL | Status: DC
  Administered 2021-07-13 – 2021-07-15 (×5): 600 mg via ORAL
  Filled 2021-07-13 (×5): qty 1

## 2021-07-13 MED ORDER — CYCLOBENZAPRINE HCL 10 MG PO TABS
5.0000 mg | ORAL_TABLET | Freq: Three times a day (TID) | ORAL | Status: DC | PRN
  Administered 2021-07-13 – 2021-07-14 (×2): 5 mg via ORAL
  Filled 2021-07-13 (×2): qty 1

## 2021-07-13 MED ORDER — INSULIN ASPART PROT & ASPART (70-30 MIX) 100 UNIT/ML ~~LOC~~ SUSP
30.0000 [IU] | Freq: Two times a day (BID) | SUBCUTANEOUS | Status: DC
  Administered 2021-07-14 – 2021-07-15 (×3): 30 [IU] via SUBCUTANEOUS
  Filled 2021-07-13: qty 10

## 2021-07-13 MED ORDER — HYDROCOD POLST-CPM POLST ER 10-8 MG/5ML PO SUER
5.0000 mL | Freq: Two times a day (BID) | ORAL | Status: DC | PRN
  Administered 2021-07-13 – 2021-07-14 (×2): 5 mL via ORAL
  Filled 2021-07-13 (×2): qty 5

## 2021-07-13 MED ORDER — INSULIN ASPART 100 UNIT/ML IJ SOLN
4.0000 [IU] | Freq: Three times a day (TID) | INTRAMUSCULAR | Status: DC
  Administered 2021-07-13 – 2021-07-14 (×4): 4 [IU] via SUBCUTANEOUS

## 2021-07-13 MED ORDER — ALLOPURINOL 100 MG PO TABS
100.0000 mg | ORAL_TABLET | ORAL | Status: DC
  Administered 2021-07-13 – 2021-07-14 (×2): 100 mg via ORAL
  Filled 2021-07-13: qty 1

## 2021-07-13 MED ORDER — ALPRAZOLAM 0.5 MG PO TABS
0.2500 mg | ORAL_TABLET | Freq: Two times a day (BID) | ORAL | Status: DC | PRN
  Administered 2021-07-13 – 2021-07-14 (×4): 0.5 mg via ORAL
  Filled 2021-07-13 (×4): qty 1

## 2021-07-13 NOTE — Progress Notes (Signed)
Patient ID: Donna Howe, female   DOB: Jul 27, 1956, 65 y.o.   MRN: [id number] S: Pt reports that she feels a little better but not great. O:BP (!) 126/98 (BP Location: Left Arm)   Pulse 91   Temp 98.4 F (36.9 C)   Resp 18   Ht 5\' 5"  (1.651 m)   Wt 95.8 kg   SpO2 96%   BMI 35.15 kg/m   Intake/Output Summary (Last 24 hours) at 07/13/2021 1143 Last data filed at 07/13/2021 0949 Gross per 24 hour  Intake 360 ml  Output --  Net 360 ml   Intake/Output: No intake/output data recorded.  Intake/Output this shift:  Total I/O In: 360 [P.O.:360] Out: -  Weight change:  Gen:NAD CVS:RRR Resp: bilateral exp wheezes Abd:+BS, soft, NT/ND Ext: trace pretibial edema, RUE AVF +T/B  Recent Labs  Lab 07/11/21 1407 07/12/21 1129 07/12/21 1503 07/13/21 0007 07/13/21 0404  NA 135 130* 133*  133* 129* 129*  K 4.4 4.8 4.8  4.7 4.1 4.4  CL 97* 94* 94*  94* 91* 91*  CO2 27 23 24  24 24 26   GLUCOSE 134* 398* 369*  368* 500* 491*  BUN 47* 63* 66*  67* 25* 30*  CREATININE 6.38* 7.27* 7.28*  7.26* 3.72* 4.02*  ALBUMIN 3.3*  --  3.3*  3.4*  --  3.0*  CALCIUM 9.4 8.6* 9.4  9.3 8.4* 8.6*  PHOS  --   --  6.9*  6.7*  --   --   AST 17  --   --   --  26  ALT 15  --   --   --  17   Liver Function Tests: Recent Labs  Lab 07/11/21 1407 07/12/21 1503 07/13/21 0404  AST 17  --  26  ALT 15  --  17  ALKPHOS 81  --  70  BILITOT 0.6  --  0.5  PROT 7.0  --  6.5  ALBUMIN 3.3* 3.3*  3.4* 3.0*   No results for input(s): LIPASE, AMYLASE in the last 168 hours. No results for input(s): AMMONIA in the last 168 hours. CBC: Recent Labs  Lab 07/11/21 1407 07/12/21 1129 07/12/21 1503 07/13/21 0404  WBC 10.5 13.1* 15.8* 14.9*  NEUTROABS 6.9  --   --   --   HGB 12.1 11.0* 11.5* 10.1*  HCT 37.4 34.5* 36.3 31.3*  MCV 108.1* 105.5* 106.8* 107.2*  PLT 200 175 225 204   Cardiac Enzymes: No results for input(s): CKTOTAL, CKMB, CKMBINDEX, TROPONINI in the last 168 hours. CBG: Recent  Labs  Lab 07/12/21 2210 07/13/21 0908 07/13/21 1101  GLUCAP 422* 361* 384*    Iron Studies: No results for input(s): IRON, TIBC, TRANSFERRIN, FERRITIN in the last 72 hours. Studies/Results: DG Chest Port 1 View  Result Date: 07/12/2021 CLINICAL DATA:  Shortness of breath, chest pain. Diagnosed with bronchitis yesterday EXAM: PORTABLE CHEST 1 VIEW COMPARISON:  Chest radiograph dated 1 day prior FINDINGS: A tunneled right-sided central venous catheter is in stable position. Median sternotomy wires are stable. A left subclavian stent is again noted. The heart size is prominent, unchanged. The mediastinal contours are stable. There is no focal consolidation or pulmonary edema. There is no pleural effusion or pneumothorax. Overall, aeration of the lungs is unchanged. The bones are stable. IMPRESSION: Stable chest with no radiographic evidence of acute cardiopulmonary process. Electronically Signed   By: Valetta Mole M.D.   On: 07/12/2021 12:19   DG Chest Portable 1 View  Result  Date: 07/11/2021 CLINICAL DATA:  Upper back pain,SOB, cough EXAM: PORTABLE CHEST 1 VIEW COMPARISON:  06/13/2021 FINDINGS: Right IJ approach central line is again noted. No new consolidation or edema. No pleural effusion. Stable cardiomediastinal contours. Left subclavian origin stent. IMPRESSION: No acute process in the chest. Electronically Signed   By: Macy Mis M.D.   On: 07/11/2021 13:25    acetaminophen  650 mg Oral Once   albuterol  10 mg/hr Nebulization Once   [START ON 07/14/2021] allopurinol  100 mg Oral Q T,Th,Sa-HD   aspirin EC  81 mg Oral Daily   atorvastatin  80 mg Oral QPM   Chlorhexidine Gluconate Cloth  6 each Topical Q0600   clopidogrel  75 mg Oral Daily   heparin injection (subcutaneous)  5,000 Units Subcutaneous Q8H   insulin aspart  0-15 Units Subcutaneous TID WC   insulin aspart  0-5 Units Subcutaneous QHS   insulin aspart  4 Units Subcutaneous TID WC   insulin aspart protamine- aspart  30  Units Subcutaneous BID WC   methylPREDNISolone (SOLU-MEDROL) injection  125 mg Intravenous Q12H   pantoprazole  40 mg Oral BID    BMET    Component Value Date/Time   NA 129 (L) 07/13/2021 0404   K 4.4 07/13/2021 0404   CL 91 (L) 07/13/2021 0404   CO2 26 07/13/2021 0404   GLUCOSE 491 (H) 07/13/2021 0404   BUN 30 (H) 07/13/2021 0404   CREATININE 4.02 (H) 07/13/2021 0404   CALCIUM 8.6 (L) 07/13/2021 0404   CALCIUM 8.7 10/27/2013 1103   GFRNONAA 12 (L) 07/13/2021 0404   GFRAA 9 (L) 06/21/2020 1043   CBC    Component Value Date/Time   WBC 14.9 (H) 07/13/2021 0404   RBC 2.92 (L) 07/13/2021 0404   HGB 10.1 (L) 07/13/2021 0404   HCT 31.3 (L) 07/13/2021 0404   PLT 204 07/13/2021 0404   MCV 107.2 (H) 07/13/2021 0404   MCH 34.6 (H) 07/13/2021 0404   MCHC 32.3 07/13/2021 0404   RDW 15.9 (H) 07/13/2021 0404   LYMPHSABS 2.3 07/11/2021 1407   MONOABS 1.0 07/11/2021 1407   EOSABS 0.2 07/11/2021 1407   BASOSABS 0.1 07/11/2021 1407   Dialysis Orders: Center: [facility]  on TTS . EDW 94.5kg HD Bath 2K/2.5Ca  Time 4:15 Heparin 2000 units. Access RIJ TDC, maturing RUE AVF BFR 400 DFR 800    Micera 50 mcg IVP every 4 weeks   Assessment/Plan:  Bronchitis - pt presented with shortness of breath with cough and congestion - respiratory panel negative.  Failed outpatient therapy.  Plan primary svc  ESRD -  continue with HD on TTS schedule  Hypertension/volume  - UF as tolerated  Anemia  - stable, no ESA  Metabolic bone disease -  resume home meds  Nutrition - renal diet, carb modified CAD s/p recent NSTEMI and PCI- stable at present.  Vascular access - maturing RUE AVF, wrist band in place  Donetta Potts, MD [employer] [phone]

## 2021-07-13 NOTE — Progress Notes (Signed)
PROGRESS NOTE    Donna Howe  XFG:[id number] DOB: 1956/09/04 DOA: 07/12/2021 PCP: Curlene Labrum, MD    Chief Complaint  Patient presents with   Shortness of Breath   Chest Pain    Brief Narrative:   CAD sp CABG (2005), recent NSTEMI treated with ballon angioplasty on 9/27,  ESRD on HD, T2DM and obesity who presented with   Subjective:  Continue to have intermittent cough, continue to wheeze, having hard time to cough up  back of neck and  lower/mid region back pain from the coughing   Assessment & Plan:   Active Problems:   Acute respiratory failure (HCC)  Acute asthma exacerbation -Was seen in the ED for the same on 10/24, was sent home with antibiotic and steroid however symptom progressed return to the ED with persistent cough and bilateral diffuse wheezing -Reports having asthma exacerbation with weather changes, also tested positive for rhinovirus -COVID test negative on 10/24 -Continue to have significant cough and bilateral wheezing, continue iv steroids, zithro  CAD, h/o CABG, recently treated for NSTEMI -s/p cath showed severe three-vessel CAD, culprit lesion was suspected to be severe in-stent restenosis and SVG to RCA, this was treated with balloon angioplasty 9/27 -Recommended to continue aspirin Plavix daily -Ranexa daily recommended -not able to tolerate betablocker due to chronic hypotension  Chronic hypotension Continue midodrine 10 mg twice daily  Insulin dependent DM2 Hyperglycemia from steroids, adjust insulin   Hyponatremia likely pseudohyponatremia in the setting of hyperglycemia Treat hyperglycemia repeat labs in the morning  ESRD on HD TTS Nephrology following  Hypothyroidism Continue Synthroid  Obesity: Body mass index is 35.15 kg/m.Marland Kitchen       Unresulted Labs (From admission, onward)     Start     Ordered   07/13/21 0034  Respiratory (~20 pathogens) panel by PCR  (Respiratory panel by PCR (~20 pathogens, ~24 hr TAT)  w  precautions)  Once,   R        07/13/21 0033   07/12/21 1455  Hepatitis B surface antibody,quantitative  (New Admission Hemo Labs (Hepatitis B))  Once,   STAT        07/12/21 1454              DVT prophylaxis: heparin injection 5,000 Units Start: 07/12/21 2200 SCDs Start: 07/12/21 2114   Code Status:full Family Communication: Patient Disposition:     Dispo: The patient is from: Home              Anticipated d/c is to: Home              Anticipated d/c date is: To be determined, pending on respiratory status, blood glucose control                Consultants:  Nephrology  Procedures:  Hemodialysis  Antimicrobials:   Anti-infectives (From admission, onward)    Start     Dose/Rate Route Frequency Ordered Stop   07/12/21 2200  azithromycin (ZITHROMAX) 250 mg in dextrose 5 % 125 mL IVPB        250 mg 125 mL/hr over 60 Minutes Intravenous Every 24 hours 07/12/21 2144     07/12/21 2130  doxycycline (VIBRAMYCIN) 100 mg in sodium chloride 0.9 % 250 mL IVPB  Status:  Discontinued        100 mg 125 mL/hr over 120 Minutes Intravenous Every 12 hours 07/12/21 2120 07/12/21 2144           Objective: Vitals:   07/12/21  2354 07/13/21 0600 07/13/21 0745 07/13/21 0803  BP: (!) 112/57 125/75 (!) 100/47 (!) 100/47  Pulse: 95 86 82 82  Resp: 20 20 17 17   Temp:   98.5 F (36.9 C) 98.5 F (36.9 C)  TempSrc:   Oral Oral  SpO2: 94% 98% 100%   Weight:    95.8 kg  Height:    5\' 5"  (1.651 m)   No intake or output data in the 24 hours ending 07/13/21 0825 Filed Weights   07/13/21 0803  Weight: 95.8 kg    Examination:  General exam: alert, awake, communicative,calm, NAD Respiratory system: bilateral diffuse wheezing, worse on the right . Respiratory effort normal. Cardiovascular system:  RRR.  Gastrointestinal system: Abdomen is nondistended, soft and nontender.  Normal bowel sounds heard. Central nervous system: Alert and oriented. No focal neurological  deficits. Extremities:  no edema Skin: No rashes, lesions or ulcers Psychiatry: Judgement and insight appear normal. Mood & affect appropriate.     Data Reviewed: I have personally reviewed following labs and imaging studies  CBC: Recent Labs  Lab 07/11/21 1407 07/12/21 1129 07/12/21 1503 07/13/21 0404  WBC 10.5 13.1* 15.8* 14.9*  NEUTROABS 6.9  --   --   --   HGB 12.1 11.0* 11.5* 10.1*  HCT 37.4 34.5* 36.3 31.3*  MCV 108.1* 105.5* 106.8* 107.2*  PLT 200 175 225 193    Basic Metabolic Panel: Recent Labs  Lab 07/11/21 1407 07/12/21 1129 07/12/21 1503 07/13/21 0007 07/13/21 0404  NA 135 130* 133*  133* 129* 129*  K 4.4 4.8 4.8  4.7 4.1 4.4  CL 97* 94* 94*  94* 91* 91*  CO2 27 23 24  24 24 26   GLUCOSE 134* 398* 369*  368* 500* 491*  BUN 47* 63* 66*  67* 25* 30*  CREATININE 6.38* 7.27* 7.28*  7.26* 3.72* 4.02*  CALCIUM 9.4 8.6* 9.4  9.3 8.4* 8.6*  MG 2.3  --   --   --   --   PHOS  --   --  6.9*  6.7*  --   --     GFR: Estimated Creatinine Clearance: 16.4 mL/min (A) (by C-G formula based on SCr of 4.02 mg/dL (H)).  Liver Function Tests: Recent Labs  Lab 07/11/21 1407 07/12/21 1503 07/13/21 0404  AST 17  --  26  ALT 15  --  17  ALKPHOS 81  --  70  BILITOT 0.6  --  0.5  PROT 7.0  --  6.5  ALBUMIN 3.3* 3.3*  3.4* 3.0*    CBG: Recent Labs  Lab 07/12/21 2210  GLUCAP 422*     Recent Results (from the past 240 hour(s))  Resp Panel by RT-PCR (Flu A&B, Covid) Nasopharyngeal Swab     Status: None   Collection Time: 07/11/21  4:02 PM   Specimen: Nasopharyngeal Swab; Nasopharyngeal(NP) swabs in vial transport medium  Result Value Ref Range Status   SARS Coronavirus 2 by RT PCR NEGATIVE NEGATIVE Final    Comment: (NOTE) SARS-CoV-2 target nucleic acids are NOT DETECTED.  The SARS-CoV-2 RNA is generally detectable in upper respiratory specimens during the acute phase of infection. The lowest concentration of SARS-CoV-2 viral copies this assay can  detect is 138 copies/mL. A negative result does not preclude SARS-Cov-2 infection and should not be used as the sole basis for treatment or other patient management decisions. A negative result may occur with  improper specimen collection/handling, submission of specimen other than nasopharyngeal swab, presence of viral  mutation(s) within the areas targeted by this assay, and inadequate number of viral copies(<138 copies/mL). A negative result must be combined with clinical observations, patient history, and epidemiological information. The expected result is Negative.  Fact Sheet for Patients:  EntrepreneurPulse.com.au  Fact Sheet for Healthcare Providers:  IncredibleEmployment.be  This test is no t yet approved or cleared by the [place] FDA and  has been authorized for detection and/or diagnosis of SARS-CoV-2 by FDA under an Emergency Use Authorization (EUA). This EUA will remain  in effect (meaning this test can be used) for the duration of the COVID-19 declaration under Section 564(b)(1) of the Act, 21 U.S.C.section 360bbb-3(b)(1), unless the authorization is terminated  or revoked sooner.       Influenza A by PCR NEGATIVE NEGATIVE Final   Influenza B by PCR NEGATIVE NEGATIVE Final    Comment: (NOTE) The Xpert Xpress SARS-CoV-2/FLU/RSV plus assay is intended as an aid in the diagnosis of influenza from Nasopharyngeal swab specimens and should not be used as a sole basis for treatment. Nasal washings and aspirates are unacceptable for Xpert Xpress SARS-CoV-2/FLU/RSV testing.  Fact Sheet for Patients: EntrepreneurPulse.com.au  Fact Sheet for Healthcare Providers: IncredibleEmployment.be  This test is not yet approved or cleared by the [place] FDA and has been authorized for detection and/or diagnosis of SARS-CoV-2 by FDA under an Emergency Use Authorization (EUA). This EUA will remain in  effect (meaning this test can be used) for the duration of the COVID-19 declaration under Section 564(b)(1) of the Act, 21 U.S.C. section 360bbb-3(b)(1), unless the authorization is terminated or revoked.  Performed at [facility], [address], [place], [place] [address]          Radiology Studies: [facility] 1 View  Result Date: 07/12/2021 CLINICAL DATA:  Shortness of breath, chest pain. Diagnosed with bronchitis yesterday EXAM: PORTABLE CHEST 1 VIEW COMPARISON:  Chest radiograph dated 1 day prior FINDINGS: A tunneled right-sided central venous catheter is in stable position. Median sternotomy wires are stable. A left subclavian stent is again noted. The heart size is prominent, unchanged. The mediastinal contours are stable. There is no focal consolidation or pulmonary edema. There is no pleural effusion or pneumothorax. Overall, aeration of the lungs is unchanged. The bones are stable. IMPRESSION: Stable chest with no radiographic evidence of acute cardiopulmonary process. Electronically Signed   By: Valetta Mole M.D.   On: 07/12/2021 12:19   DG Chest Portable 1 View  Result Date: 07/11/2021 CLINICAL DATA:  Upper back pain,SOB, cough EXAM: PORTABLE CHEST 1 VIEW COMPARISON:  06/13/2021 FINDINGS: Right IJ approach central line is again noted. No new consolidation or edema. No pleural effusion. Stable cardiomediastinal contours. Left subclavian origin stent. IMPRESSION: No acute process in the chest. Electronically Signed   By: Macy Mis M.D.   On: 07/11/2021 13:25        Scheduled Meds:  acetaminophen  650 mg Oral Once   albuterol  10 mg/hr Nebulization Once   [START ON 07/14/2021] allopurinol  100 mg Oral Q T,Th,Sa-HD   aspirin EC  81 mg Oral Daily   atorvastatin  80 mg Oral QPM   Chlorhexidine Gluconate Cloth  6 each Topical Q0600   clopidogrel  75 mg Oral Daily   heparin injection (subcutaneous)  5,000 Units Subcutaneous Q8H   insulin aspart  0-15 Units  Subcutaneous TID WC   insulin aspart  0-5 Units Subcutaneous QHS   insulin aspart  4 Units Subcutaneous TID WC   insulin aspart  protamine- aspart  30 Units Subcutaneous BID WC   methylPREDNISolone (SOLU-MEDROL) injection  125 mg Intravenous Q12H   pantoprazole  40 mg Oral BID   Continuous Infusions:  sodium chloride     sodium chloride     azithromycin Stopped (07/13/21 0330)     LOS: 0 days   Time spent: 34mins Greater than 50% of this time was spent in counseling, explanation of diagnosis, planning of further management, and coordination of care.   Voice Recognition Viviann Spare dictation system was used to create this note, attempts have been made to correct errors. Please contact the author with questions and/or clarifications.   Florencia Reasons, MD PhD FACP [facility]  Available via Epic secure chat 7am-7pm for nonurgent issues Please page for urgent issues To page the attending provider between 7A-7P or the covering provider during after hours 7P-7A, please log into the web site [email or web address] and access using universal [place] password for that web site. If you do not have the password, please call the hospital operator.    07/13/2021, 8:25 AM

## 2021-07-14 ENCOUNTER — Encounter (HOSPITAL_COMMUNITY): Payer: Self-pay | Admitting: Internal Medicine

## 2021-07-14 ENCOUNTER — Inpatient Hospital Stay (HOSPITAL_COMMUNITY): Payer: Medicare HMO

## 2021-07-14 DIAGNOSIS — E119 Type 2 diabetes mellitus without complications: Secondary | ICD-10-CM | POA: Diagnosis not present

## 2021-07-14 DIAGNOSIS — J45901 Unspecified asthma with (acute) exacerbation: Secondary | ICD-10-CM | POA: Diagnosis not present

## 2021-07-14 DIAGNOSIS — Z794 Long term (current) use of insulin: Secondary | ICD-10-CM | POA: Diagnosis not present

## 2021-07-14 DIAGNOSIS — N186 End stage renal disease: Secondary | ICD-10-CM | POA: Diagnosis not present

## 2021-07-14 LAB — CBC WITH DIFFERENTIAL/PLATELET
Abs Immature Granulocytes: 0.19 10*3/uL — ABNORMAL HIGH (ref 0.00–0.07)
Basophils Absolute: 0 10*3/uL (ref 0.0–0.1)
Basophils Relative: 0 %
Eosinophils Absolute: 0 10*3/uL (ref 0.0–0.5)
Eosinophils Relative: 0 %
HCT: 33.5 % — ABNORMAL LOW (ref 36.0–46.0)
Hemoglobin: 10.5 g/dL — ABNORMAL LOW (ref 12.0–15.0)
Immature Granulocytes: 2 %
Lymphocytes Relative: 8 %
Lymphs Abs: 0.8 10*3/uL (ref 0.7–4.0)
MCH: 34.8 pg — ABNORMAL HIGH (ref 26.0–34.0)
MCHC: 31.3 g/dL (ref 30.0–36.0)
MCV: 110.9 fL — ABNORMAL HIGH (ref 80.0–100.0)
Monocytes Absolute: 0.3 10*3/uL (ref 0.1–1.0)
Monocytes Relative: 3 %
Neutro Abs: 8.8 10*3/uL — ABNORMAL HIGH (ref 1.7–7.7)
Neutrophils Relative %: 87 %
Platelets: 192 10*3/uL (ref 150–400)
RBC: 3.02 MIL/uL — ABNORMAL LOW (ref 3.87–5.11)
RDW: 16 % — ABNORMAL HIGH (ref 11.5–15.5)
Smear Review: NORMAL
WBC: 10.1 10*3/uL (ref 4.0–10.5)
nRBC: 0 % (ref 0.0–0.2)

## 2021-07-14 LAB — BASIC METABOLIC PANEL
Anion gap: 11 (ref 5–15)
Anion gap: 14 (ref 5–15)
BUN: 57 mg/dL — ABNORMAL HIGH (ref 8–23)
BUN: 63 mg/dL — ABNORMAL HIGH (ref 8–23)
CO2: 23 mmol/L (ref 22–32)
CO2: 20 mmol/L — ABNORMAL LOW (ref 22–32)
Calcium: 8.7 mg/dL — ABNORMAL LOW (ref 8.9–10.3)
Calcium: 9 mg/dL (ref 8.9–10.3)
Chloride: 93 mmol/L — ABNORMAL LOW (ref 98–111)
Chloride: 92 mmol/L — ABNORMAL LOW (ref 98–111)
Creatinine, Ser: 5.4 mg/dL — ABNORMAL HIGH (ref 0.44–1.00)
Creatinine, Ser: 5.8 mg/dL — ABNORMAL HIGH (ref 0.44–1.00)
GFR, Estimated: 8 mL/min — ABNORMAL LOW (ref >60)
GFR, Estimated: 8 mL/min — ABNORMAL LOW (ref >60)
Glucose, Bld: 417 mg/dL — ABNORMAL HIGH (ref 70–99)
Glucose, Bld: 507 mg/dL (ref 70–99)
Potassium: 6.4 mmol/L (ref 3.5–5.1)
Potassium: 6.9 mmol/L (ref 3.5–5.1)
Sodium: 127 mmol/L — ABNORMAL LOW (ref 135–145)
Sodium: 126 mmol/L — ABNORMAL LOW (ref 135–145)

## 2021-07-14 LAB — RENAL FUNCTION PANEL
Albumin: 3.1 g/dL — ABNORMAL LOW (ref 3.5–5.0)
Anion gap: 11 (ref 5–15)
BUN: 66 mg/dL — ABNORMAL HIGH (ref 8–23)
CO2: 22 mmol/L (ref 22–32)
Calcium: 8.8 mg/dL — ABNORMAL LOW (ref 8.9–10.3)
Chloride: 92 mmol/L — ABNORMAL LOW (ref 98–111)
Creatinine, Ser: 6.02 mg/dL — ABNORMAL HIGH (ref 0.44–1.00)
GFR, Estimated: 7 mL/min — ABNORMAL LOW (ref >60)
Glucose, Bld: 437 mg/dL — ABNORMAL HIGH (ref 70–99)
Phosphorus: 5.6 mg/dL — ABNORMAL HIGH (ref 2.5–4.6)
Potassium: 4.7 mmol/L (ref 3.5–5.1)
Sodium: 125 mmol/L — ABNORMAL LOW (ref 135–145)

## 2021-07-14 LAB — GLUCOSE, CAPILLARY
Glucose-Capillary: 419 mg/dL — ABNORMAL HIGH (ref 70–99)
Glucose-Capillary: 496 mg/dL — ABNORMAL HIGH (ref 70–99)
Glucose-Capillary: 462 mg/dL — ABNORMAL HIGH (ref 70–99)
Glucose-Capillary: 164 mg/dL — ABNORMAL HIGH (ref 70–99)
Glucose-Capillary: 174 mg/dL — ABNORMAL HIGH (ref 70–99)
Glucose-Capillary: 111 mg/dL — ABNORMAL HIGH (ref 70–99)

## 2021-07-14 MED ORDER — LEVOTHYROXINE SODIUM 112 MCG PO TABS: 112.0000 ug | ORAL_TABLET | Freq: Every day | ORAL | Status: DC

## 2021-07-14 MED ORDER — LEVOTHYROXINE SODIUM 112 MCG PO TABS
112.0000 ug | ORAL_TABLET | Freq: Every day | ORAL | Status: DC
  Administered 2021-07-15: 112 ug via ORAL
  Filled 2021-07-14: qty 1

## 2021-07-14 MED ORDER — EZETIMIBE 10 MG PO TABS
10.0000 mg | ORAL_TABLET | Freq: Every day | ORAL | Status: DC
  Administered 2021-07-14 – 2021-07-15 (×2): 10 mg via ORAL
  Filled 2021-07-14 (×2): qty 1

## 2021-07-14 MED ORDER — RENA-VITE PO TABS
1.0000 | ORAL_TABLET | Freq: Every day | ORAL | Status: DC
  Administered 2021-07-14 – 2021-07-15 (×2): 1 via ORAL
  Filled 2021-07-14 (×2): qty 1

## 2021-07-14 MED ORDER — INSULIN ASPART 100 UNIT/ML IJ SOLN
15.0000 [IU] | Freq: Once | INTRAMUSCULAR | Status: AC
  Administered 2021-07-14: 15 [IU] via SUBCUTANEOUS

## 2021-07-14 MED ORDER — HEPARIN SODIUM (PORCINE) 1000 UNIT/ML DIALYSIS: 20.0000 [IU]/kg | INTRAMUSCULAR | Status: DC | PRN

## 2021-07-14 MED ORDER — INSULIN ASPART 100 UNIT/ML IJ SOLN
8.0000 [IU] | Freq: Three times a day (TID) | INTRAMUSCULAR | Status: DC
  Administered 2021-07-14 – 2021-07-15 (×3): 8 [IU] via SUBCUTANEOUS

## 2021-07-14 MED ORDER — MIDODRINE HCL 5 MG PO TABS
10.0000 mg | ORAL_TABLET | Freq: Two times a day (BID) | ORAL | Status: DC
  Administered 2021-07-14 – 2021-07-15 (×2): 10 mg via ORAL
  Filled 2021-07-14 (×2): qty 2

## 2021-07-14 MED ORDER — IPRATROPIUM-ALBUTEROL 0.5-2.5 (3) MG/3ML IN SOLN: 3.0000 mL | Freq: Once | RESPIRATORY_TRACT | Status: DC

## 2021-07-14 MED ORDER — FERRIC CITRATE 1 GM 210 MG(FE) PO TABS
420.0000 mg | ORAL_TABLET | Freq: Three times a day (TID) | ORAL | Status: DC
  Administered 2021-07-14 – 2021-07-15 (×3): 420 mg via ORAL
  Filled 2021-07-14 (×12): qty 2

## 2021-07-14 MED ORDER — METHYLPREDNISOLONE SODIUM SUCC 40 MG IJ SOLR
40.0000 mg | Freq: Two times a day (BID) | INTRAMUSCULAR | Status: DC
  Administered 2021-07-14 – 2021-07-15 (×2): 40 mg via INTRAVENOUS
  Filled 2021-07-14 (×2): qty 1

## 2021-07-14 MED ORDER — RANOLAZINE ER 500 MG PO TB12
500.0000 mg | ORAL_TABLET | Freq: Every day | ORAL | Status: DC
  Administered 2021-07-14: 500 mg via ORAL
  Filled 2021-07-14: qty 1

## 2021-07-14 MED ORDER — NITROGLYCERIN 0.4 MG SL SUBL: 0.4000 mg | SUBLINGUAL_TABLET | SUBLINGUAL | Status: DC | PRN

## 2021-07-14 MED ORDER — OXYCODONE-ACETAMINOPHEN 5-325 MG PO TABS
1.0000 | ORAL_TABLET | Freq: Three times a day (TID) | ORAL | Status: DC | PRN
  Administered 2021-07-14 – 2021-07-15 (×2): 1 via ORAL
  Filled 2021-07-14 (×2): qty 1

## 2021-07-14 MED ORDER — SODIUM ZIRCONIUM CYCLOSILICATE 5 G PO PACK
5.0000 g | PACK | Freq: Every day | ORAL | Status: DC
  Administered 2021-07-14: 5 g via ORAL
  Filled 2021-07-14: qty 1

## 2021-07-14 MED ORDER — ONDANSETRON HCL 4 MG PO TABS: 8.0000 mg | ORAL_TABLET | Freq: Three times a day (TID) | ORAL | Status: DC | PRN

## 2021-07-14 MED ORDER — IPRATROPIUM BROMIDE HFA 17 MCG/ACT IN AERS: 2.0000 | INHALATION_SPRAY | RESPIRATORY_TRACT | Status: DC | PRN

## 2021-07-14 MED ORDER — MUSCLE RUB 10-15 % EX CREA
TOPICAL_CREAM | CUTANEOUS | Status: DC | PRN
  Filled 2021-07-14: qty 85

## 2021-07-14 MED ORDER — IPRATROPIUM-ALBUTEROL 0.5-2.5 (3) MG/3ML IN SOLN
3.0000 mL | Freq: Four times a day (QID) | RESPIRATORY_TRACT | Status: DC
  Administered 2021-07-14 – 2021-07-15 (×3): 3 mL via RESPIRATORY_TRACT
  Filled 2021-07-14 (×3): qty 3

## 2021-07-14 MED ORDER — GABAPENTIN 400 MG PO CAPS
400.0000 mg | ORAL_CAPSULE | Freq: Every day | ORAL | Status: DC
  Administered 2021-07-14: 400 mg via ORAL
  Filled 2021-07-14: qty 1

## 2021-07-14 MED ORDER — SENNOSIDES-DOCUSATE SODIUM 8.6-50 MG PO TABS: 1.0000 | ORAL_TABLET | Freq: Every evening | ORAL | Status: DC | PRN

## 2021-07-14 MED ORDER — ALBUTEROL SULFATE 108 (90 BASE) MCG/ACT IN AEPB: 2.0000 | INHALATION_SPRAY | Freq: Four times a day (QID) | RESPIRATORY_TRACT | Status: DC | PRN

## 2021-07-14 NOTE — TOC Initial Note (Signed)
Transition of Care [facility]) - Initial/Assessment Note    Patient Details  Name: Donna Howe MRN: [id number] Date of Birth: 06/23/1956  [facility]) CM/SW Contact:    Salome Arnt, [place] Phone Number: 07/14/2021, 9:42 AM  Clinical Narrative:  Pt admitted for acute respiratory failure/bronchitis. Assessment completed due to high risk readmission score. Pt reports she lives alone and is independent with ADLs. She has been on dialysis for about a year at [facility] in [place] on TTS schedule. Pt states she drives herself to dialysis. Pt plans to return home when medically stable. No needs reported at this time.  TOC will continue to follow.                Expected Discharge Plan: Home/Self Care Barriers to Discharge: Continued Medical Work up   Patient Goals and CMS Choice Patient states their goals for this hospitalization and ongoing recovery are:: return home   Choice offered to / list presented to : Patient  Expected Discharge Plan and Services Expected Discharge Plan: Home/Self Care In-house Referral: Clinical Social Work     Living arrangements for the past 2 months: Single Family Home                 DME Arranged: N/A                    Prior Living Arrangements/Services Living arrangements for the past 2 months: Single Family Home Lives with:: Self Patient language and need for interpreter reviewed:: Yes Do you feel safe going back to the place where you live?: Yes      Need for Family Participation in Patient Care: No (Comment)     Criminal Activity/Legal Involvement Pertinent to Current Situation/Hospitalization: No - Comment as needed  Activities of Daily Living Home Assistive Devices/Equipment: None ADL Screening (condition at time of admission) Patient's cognitive ability adequate to safely complete daily activities?: Yes Is the patient deaf or have difficulty hearing?: No Does the patient have difficulty seeing, even when  wearing glasses/contacts?: No Does the patient have difficulty concentrating, remembering, or making decisions?: No Patient able to express need for assistance with ADLs?: Yes Does the patient have difficulty dressing or bathing?: No Independently performs ADLs?: Yes (appropriate for developmental age) Does the patient have difficulty walking or climbing stairs?: No Weakness of Legs: None Weakness of Arms/Hands: None  Permission Sought/Granted                  Emotional Assessment   Attitude/Demeanor/Rapport: Engaged Affect (typically observed): Accepting Orientation: : Oriented to Self, Oriented to Place, Oriented to  Time, Oriented to Situation Alcohol / Substance Use: Not Applicable Psych Involvement: No (comment)  Admission diagnosis:  Acute respiratory failure (HCC) [J96.00] SOB (shortness of breath) [R06.02] Exacerbation of asthma, unspecified asthma severity, unspecified whether persistent [J45.901] COPD (chronic obstructive pulmonary disease) (HCC) [J44.9] Patient Active Problem List   Diagnosis Date Noted   COPD (chronic obstructive pulmonary disease) ([place]) 07/13/2021   Insulin dependent type 2 diabetes mellitus ([place])    Acute respiratory failure ([place]) 07/12/2021   Nausea 07/02/2021   Pulmonary edema 06/14/2021   Other fluid overload 03/01/2021   Stenosis of left subclavian artery (HCC)    Non-ST elevation (NSTEMI) myocardial infarction ([place]) 01/12/2021   Leukocytosis 01/09/2021   Elevated MCV 01/09/2021   Diabetic neuropathy ([place]) 01/09/2021   COVID-19 09/30/2020   Unspecified protein-calorie malnutrition ([place]) 07/05/2020   Allergy, unspecified, initial encounter 06/30/2020   Anaphylactic shock,  unspecified, initial encounter 06/30/2020   Anemia in chronic kidney disease 06/30/2020   Coagulation defect, unspecified ([place]) 06/30/2020   Diarrhea, unspecified 06/30/2020   Encounter for immunization 06/30/2020   Iron deficiency anemia, unspecified 06/30/2020    Pain, unspecified 06/30/2020   PAD (peripheral artery disease) ([place]) 06/30/2020   Pruritus, unspecified 06/30/2020   Secondary hyperparathyroidism of renal origin ([place]) 06/30/2020   ESRD (end stage renal disease) (HCC)    SOB (shortness of breath)    Chronic diastolic HF (heart failure) (HCC)    GERD (gastroesophageal reflux disease)    Acute renal failure superimposed on stage 5 chronic kidney disease, not on chronic dialysis ([place]) 06/13/2020   Hyperglycemia due to diabetes mellitus ([place]) 06/13/2020   Pneumonia 12/17/2019   Cough variant asthma with component of UACS 11/20/2019   Elevated troponin I level 10/02/2019   Chest pain 10/01/2019   Unstable angina ([place]) 04/18/2018   Hypothyroidism (acquired) 04/18/2018   S/P angioplasty with stent- DES to [facility] and to [place] to LAD with DES 04/09/18.   04/10/2018   Angina pectoris ([place]) 04/05/2018   Status post coronary artery stent placement    Acute coronary syndrome ([place]) 05/31/2017   Acute chest pain    History of coronary artery disease    Diabetes mellitus with ESRD (end-stage renal disease) ([place]) 10/28/2013   Hypoglycemia associated with diabetes ([place]) 10/28/2013   Thrombocytopenia, unspecified ([place]) 10/28/2013   Hypokalemia 10/27/2013   Syncope 10/26/2013   Anemia 10/26/2013   AKI (acute kidney injury) ([place]) 10/26/2013   Fracture of toe of right foot 71/69/6789   Umbilical hernia    Carotid artery disease ([place])    Occlusion and stenosis of carotid artery without mention of cerebral infarction 04/15/2013   Hx of CABG    Ejection fraction    Atherosclerosis of native artery of extremity with intermittent claudication ([place]) 02/11/2013   Diabetes mellitus with renal manifestation ([place]) 01/21/2013   Bradycardia 01/03/2013   Chronic kidney disease (CKD), stage III (moderate) (HCC)    Gout    PROTEINURIA, MILD 01/18/2010   Obesity (BMI 30-39.9) 08/26/2009   Asthma, chronic, unspecified asthma severity, with acute exacerbation  08/26/2009   Mixed hyperlipidemia 03/31/2007   Essential hypertension 03/31/2007   CAD (coronary artery disease) 03/31/2007   PCP:  Curlene Labrum, MD Pharmacy:   [facility] [address], [place] - [place] [address] [place] [address] Phone: [phone] Fax: [phone]  [facility] - [place], [place] - [address] [place] [address] Phone: [phone] Fax: [phone]  [facility] [place], [place] - [place] [address] Phone: [phone] Fax: [phone]     Social Determinants of Health (SDOH) Interventions    Readmission Risk Interventions Readmission Risk Prevention Plan 07/14/2021 06/15/2021 02/25/2021  Transportation Screening Complete Complete Complete  Medication Review Press photographer) Complete - Complete  PCP or Specialist appointment within 3-5 days of discharge - Complete -  [place] or Home Care Consult Complete Complete Complete  SW Recovery Care/Counseling Consult Complete Complete Complete  Palliative Care Screening Not Applicable Not Applicable Not Applicable  Skilled Nursing Facility Not Applicable Not Applicable Not Applicable  Some recent data might be hidden

## 2021-07-14 NOTE — Progress Notes (Signed)
Date and time results received: 07/14/21 0900 Test: glucose  Critical Value: 507 Name of Provider Notified: Dr. Erlinda Hong Awaiting new orders Deirdre Pippins, RN

## 2021-07-14 NOTE — Procedures (Signed)
   HEMODIALYSIS TREATMENT NOTE:   Uneventful 4 hour low-heparin HD session completed via RIJ tDC.  Goal met: 2 liters removed.  UF goal was lowered once in response to declining BP.  All blood was returned.  No changes from pre-HD assessment.   Rockwell Alexandria, RN

## 2021-07-14 NOTE — Progress Notes (Signed)
Results for AERIEL, BOULAY (MRN [id number]) as of 07/14/2021 12:57  Ref. Range 07/13/2021 20:31 07/14/2021 07:19 07/14/2021 09:43 07/14/2021 10:53  Glucose-Capillary Latest Ref Range: 70 - 99 mg/dL 239 (H) 419 (H) 496 (H) 462 (H)  Noted that blood sugars have been greater than 400 mg/dl. Noted that extra Novolog given for high blood sugars.  Patient has ESRD and on dialysis. Suggested to staff RN to monitor blood sugars closely due to the amount of Novolog that patient has been given.   Will continue to follow while in the hospital.  Harvel Ricks RN BSN CDE Diabetes Coordinator Pager: [phone]  8am-5pm

## 2021-07-14 NOTE — Progress Notes (Signed)
PROGRESS NOTE    Donna Howe  WNI:[id number] DOB: [birth year] DOA: 07/12/2021 PCP: Curlene Labrum, MD    Chief Complaint  Patient presents with   Shortness of Breath   Chest Pain    Brief Narrative:   CAD sp CABG (2005), recent NSTEMI treated with ballon angioplasty on 9/27,  ESRD on HD, T2DM and obesity who presented with   Subjective:  Continue to have intermittent cough, continue to wheeze, having hard time to cough up, feeling about the same as yesterday Reports tussionex help the cough  back of neck and  lower/mid region back pain from the coughing , wants percocet to help the pain    Assessment & Plan:   Active Problems:   Asthma, chronic, unspecified asthma severity, with acute exacerbation   ESRD (end stage renal disease) (HCC)   Acute respiratory failure (HCC)   COPD (chronic obstructive pulmonary disease) (HCC)   Insulin dependent type 2 diabetes mellitus (HCC)  Acute asthma exacerbation -Was seen in the ED for the same on 10/24, was sent home with antibiotic and steroid however symptom progressed return to the ED with persistent cough and bilateral diffuse wheezing -Reports having asthma exacerbation with weather changes, also tested positive for rhinovirus -COVID test negative on 10/24 -Continue to have significant cough and bilateral wheezing ( worse on the right side), appear slightly improved to me on exam ,though paitnet states she feels the same, will try to reduce iv steroids dose as she has significant hyperglycemia on higher steroids dose, continue zithro -reports since covid in 09/2020, she has been having chronic cough was scheduled to have Ct chest done tomorrow, she wonder if this can be done here,  Ct chest without contrast ordered due to persistent cough and wheezing, will follow up on result  CAD, h/o CABG, recently treated for NSTEMI -s/p cath showed severe three-vessel CAD, culprit lesion was suspected to be severe in-stent restenosis and  SVG to RCA, this was treated with balloon angioplasty 9/27 -Recommended to continue aspirin Plavix daily -Ranexa daily recommended -not able to tolerate betablocker due to chronic hypotension  Chronic hypotension Continue midodrine 10 mg twice daily  Insulin dependent DM2 Hyperglycemia from steroids, adjust insulin , cutting down steroids  Hyponatremia likely pseudohyponatremia in the setting of hyperglycemia Treat hyperglycemia repeat labs in the morning  ESRD on HD TTS Nephrology following  Hyperkalemia on 10/27, received lokelma, then HD, corrected, repeat bmp in am  Hypothyroidism Continue Synthroid  Obesity: Body mass index is 36.21 kg/m.Marland Kitchen       Unresulted Labs (From admission, onward)    None         DVT prophylaxis: heparin injection 5,000 Units Start: 07/12/21 2200 SCDs Start: 07/12/21 2114   Code Status:full Family Communication: Patient Disposition:     Dispo: The patient is from: Home, lives alone, independent in ADLs              Anticipated d/c is to: Home              Anticipated d/c date is: possible 24-48hrs, pending on respiratory status, blood glucose control                Consultants:  Nephrology  Procedures:  Hemodialysis  Antimicrobials:   Anti-infectives (From admission, onward)    Start     Dose/Rate Route Frequency Ordered Stop   07/12/21 2200  azithromycin (ZITHROMAX) 250 mg in dextrose 5 % 125 mL IVPB  250 mg 125 mL/hr over 60 Minutes Intravenous Every 24 hours 07/12/21 2144     07/12/21 2130  doxycycline (VIBRAMYCIN) 100 mg in sodium chloride 0.9 % 250 mL IVPB  Status:  Discontinued        100 mg 125 mL/hr over 120 Minutes Intravenous Every 12 hours 07/12/21 2120 07/12/21 2144           Objective: Vitals:   07/14/21 1500 07/14/21 1530 07/14/21 1545 07/14/21 1606  BP: (!) 117/58 122/61 120/61 (!) 83/42  Pulse: 89 88 84 90  Resp: 20 18 18 18   Temp:    98.2 F (36.8 C)  TempSrc:      SpO2:    98%   Weight:      Height:        Intake/Output Summary (Last 24 hours) at 07/14/2021 1646 Last data filed at 07/14/2021 1545 Gross per 24 hour  Intake 1210 ml  Output 2077 ml  Net -867 ml   Filed Weights   07/13/21 0803 07/14/21 1140  Weight: 95.8 kg 98.7 kg    Examination:  General exam: alert, awake, communicative,calm, NAD Respiratory system: bilateral diffuse wheezing, worse on the right . Respiratory effort normal. Cardiovascular system:  RRR.  Gastrointestinal system: Abdomen is nondistended, soft and nontender.  Normal bowel sounds heard. Central nervous system: Alert and oriented. No focal neurological deficits. Extremities:  no edema Skin: No rashes, lesions or ulcers Psychiatry: Judgement and insight appear normal. Mood & affect appropriate.     Data Reviewed: I have personally reviewed following labs and imaging studies  CBC: Recent Labs  Lab 07/11/21 1407 07/12/21 1129 07/12/21 1503 07/13/21 0404 07/14/21 0623  WBC 10.5 13.1* 15.8* 14.9* 10.1  NEUTROABS 6.9  --   --   --  8.8*  HGB 12.1 11.0* 11.5* 10.1* 10.5*  HCT 37.4 34.5* 36.3 31.3* 33.5*  MCV 108.1* 105.5* 106.8* 107.2* 110.9*  PLT 200 175 225 204 998    Basic Metabolic Panel: Recent Labs  Lab 07/11/21 1407 07/12/21 1129 07/12/21 1503 07/13/21 0007 07/13/21 0404 07/14/21 0623 07/14/21 0836 07/14/21 1215  NA 135   < > 133*  133* 129* 129* 127* 126* 125*  K 4.4   < > 4.8  4.7 4.1 4.4 6.4* 6.9* 4.7  CL 97*   < > 94*  94* 91* 91* 93* 92* 92*  CO2 27   < > 24  24 24 26 23  20* 22  GLUCOSE 134*   < > 369*  368* 500* 491* 417* 507* 437*  BUN 47*   < > 66*  67* 25* 30* 57* 63* 66*  CREATININE 6.38*   < > 7.28*  7.26* 3.72* 4.02* 5.40* 5.80* 6.02*  CALCIUM 9.4   < > 9.4  9.3 8.4* 8.6* 8.7* 9.0 8.8*  MG 2.3  --   --   --   --   --   --   --   PHOS  --   --  6.9*  6.7*  --   --   --   --  5.6*   < > = values in this interval not displayed.    GFR: Estimated Creatinine Clearance: 11.1  mL/min (A) (by C-G formula based on SCr of 6.02 mg/dL (H)).  Liver Function Tests: Recent Labs  Lab 07/11/21 1407 07/12/21 1503 07/13/21 0404 07/14/21 1215  AST 17  --  26  --   ALT 15  --  17  --   ALKPHOS 81  --  70  --   BILITOT 0.6  --  0.5  --   PROT 7.0  --  6.5  --   ALBUMIN 3.3* 3.3*  3.4* 3.0* 3.1*    CBG: Recent Labs  Lab 07/14/21 0719 07/14/21 0943 07/14/21 1053 07/14/21 1353 07/14/21 1637  GLUCAP 419* 496* 462* 164* 174*     Recent Results (from the past 240 hour(s))  Resp Panel by RT-PCR (Flu A&B, Covid) Nasopharyngeal Swab     Status: None   Collection Time: 07/11/21  4:02 PM   Specimen: Nasopharyngeal Swab; Nasopharyngeal(NP) swabs in vial transport medium  Result Value Ref Range Status   SARS Coronavirus 2 by RT PCR NEGATIVE NEGATIVE Final    Comment: (NOTE) SARS-CoV-2 target nucleic acids are NOT DETECTED.  The SARS-CoV-2 RNA is generally detectable in upper respiratory specimens during the acute phase of infection. The lowest concentration of SARS-CoV-2 viral copies this assay can detect is 138 copies/mL. A negative result does not preclude SARS-Cov-2 infection and should not be used as the sole basis for treatment or other patient management decisions. A negative result may occur with  improper specimen collection/handling, submission of specimen other than nasopharyngeal swab, presence of viral mutation(s) within the areas targeted by this assay, and inadequate number of viral copies(<138 copies/mL). A negative result must be combined with clinical observations, patient history, and epidemiological information. The expected result is Negative.  Fact Sheet for Patients:  EntrepreneurPulse.com.au  Fact Sheet for Healthcare Providers:  IncredibleEmployment.be  This test is no t yet approved or cleared by the [place] FDA and  has been authorized for detection and/or diagnosis of SARS-CoV-2 by FDA  under an Emergency Use Authorization (EUA). This EUA will remain  in effect (meaning this test can be used) for the duration of the COVID-19 declaration under Section 564(b)(1) of the Act, 21 U.S.C.section 360bbb-3(b)(1), unless the authorization is terminated  or revoked sooner.       Influenza A by PCR NEGATIVE NEGATIVE Final   Influenza B by PCR NEGATIVE NEGATIVE Final    Comment: (NOTE) The Xpert Xpress SARS-CoV-2/FLU/RSV plus assay is intended as an aid in the diagnosis of influenza from Nasopharyngeal swab specimens and should not be used as a sole basis for treatment. Nasal washings and aspirates are unacceptable for Xpert Xpress SARS-CoV-2/FLU/RSV testing.  Fact Sheet for Patients: EntrepreneurPulse.com.au  Fact Sheet for Healthcare Providers: IncredibleEmployment.be  This test is not yet approved or cleared by the [place] FDA and has been authorized for detection and/or diagnosis of SARS-CoV-2 by FDA under an Emergency Use Authorization (EUA). This EUA will remain in effect (meaning this test can be used) for the duration of the COVID-19 declaration under Section 564(b)(1) of the Act, 21 U.S.C. section 360bbb-3(b)(1), unless the authorization is terminated or revoked.  Performed at [facility], [address] [place], [place] [address]   Respiratory (~20 pathogens) panel by PCR     Status: Abnormal   Collection Time: 07/13/21  1:20 AM   Specimen: Nasopharyngeal Swab; Respiratory  Result Value Ref Range Status   Adenovirus NOT DETECTED NOT DETECTED Final   Coronavirus 229E NOT DETECTED NOT DETECTED Final    Comment: (NOTE) The Coronavirus on the Respiratory Panel, DOES NOT test for the novel  Coronavirus (2019 nCoV)    Coronavirus HKU1 NOT DETECTED NOT DETECTED Final   Coronavirus NL63 NOT DETECTED NOT DETECTED Final   Coronavirus OC43 NOT DETECTED NOT DETECTED Final   Metapneumovirus NOT DETECTED NOT DETECTED Final  Rhinovirus / Enterovirus DETECTED (A) NOT DETECTED Final   Influenza A NOT DETECTED NOT DETECTED Final   Influenza B NOT DETECTED NOT DETECTED Final   Parainfluenza Virus 1 NOT DETECTED NOT DETECTED Final   Parainfluenza Virus 2 NOT DETECTED NOT DETECTED Final   Parainfluenza Virus 3 NOT DETECTED NOT DETECTED Final   Parainfluenza Virus 4 NOT DETECTED NOT DETECTED Final   Respiratory Syncytial Virus NOT DETECTED NOT DETECTED Final   Bordetella pertussis NOT DETECTED NOT DETECTED Final   Bordetella Parapertussis NOT DETECTED NOT DETECTED Final   Chlamydophila pneumoniae NOT DETECTED NOT DETECTED Final   Mycoplasma pneumoniae NOT DETECTED NOT DETECTED Final    Comment: Performed at [facility], [place] [address] [place],  [address]         Radiology Studies: No results found.      Scheduled Meds:  acetaminophen  650 mg Oral Once   allopurinol  100 mg Oral Q T,Th,Sa-HD   aspirin EC  81 mg Oral Daily   atorvastatin  80 mg Oral QPM   Chlorhexidine Gluconate Cloth  6 each Topical Q0600   clopidogrel  75 mg Oral Daily   guaiFENesin  600 mg Oral BID   heparin injection (subcutaneous)  5,000 Units Subcutaneous Q8H   insulin aspart  0-15 Units Subcutaneous TID WC   insulin aspart  0-5 Units Subcutaneous QHS   insulin aspart  8 Units Subcutaneous TID WC   insulin aspart protamine- aspart  30 Units Subcutaneous BID WC   ipratropium-albuterol  3 mL Nebulization Q6H   ipratropium-albuterol  3 mL Nebulization Once   methylPREDNISolone (SOLU-MEDROL) injection  40 mg Intravenous Q12H   pantoprazole  40 mg Oral BID   sodium zirconium cyclosilicate  5 g Oral Daily   Continuous Infusions:  sodium chloride     sodium chloride     azithromycin Stopped (07/13/21 2328)     LOS: 1 day   Time spent: 11mins Greater than 50% of this time was spent in counseling, explanation of diagnosis, planning of further management, and coordination of care.   Voice Recognition Viviann Spare  dictation system was used to create this note, attempts have been made to correct errors. Please contact the author with questions and/or clarifications.   Florencia Reasons, MD PhD FACP [facility]  Available via Epic secure chat 7am-7pm for nonurgent issues Please page for urgent issues To page the attending provider between 7A-7P or the covering provider during after hours 7P-7A, please log into the web site [email or web address] and access using universal [place] password for that web site. If you do not have the password, please call the hospital operator.    07/14/2021, 4:46 PM

## 2021-07-14 NOTE — Progress Notes (Signed)
[facility] ROUNDING NOTE   Subjective:   Interval History   This is a very nice lady with ESRD and scheduled for dialysis 10/27  she was admitted 10/25 with bronchitis. She was placed on antibiotics and is symptomatically better.  BP 95/55  P 76  T 97.7      Na 126  K 6.9   Co2 20   Cr 5.8  BUN 63  Meds  allopurinol, asa, lipitor, plavix, insulin, protonix,   IV doxy and IV Azithromycin    Objective:  Vital signs in last 24 hours:  Temp:  [97.7 F (36.5 C)-98.4 F (36.9 C)] 97.7 F (36.5 C) (10/27 0520) Pulse Rate:  [76-92] 76 (10/27 0520) Resp:  [17-18] 17 (10/27 0520) BP: (87-126)/(40-98) 95/55 (10/27 0520) SpO2:  [94 %-100 %] 95 % (10/27 1057)  Weight change:  Filed Weights   07/13/21 0803  Weight: 95.8 kg    Intake/Output: I/O last 3 completed shifts: In: 1330 [P.O.:1080; IV Piggyback:250] Out: -    Intake/Output this shift:  Total I/O In: 240 [P.O.:240] Out: -   Gen:NAD CVS:RRR Resp: bilateral exp wheezes Abd:+BS, soft, NT/ND Ext: trace pretibial edema, RUE AVF +T/B   Basic Metabolic Panel: Recent Labs  Lab 07/11/21 1407 07/12/21 1129 07/12/21 1503 07/13/21 0007 07/13/21 0404 07/14/21 0623 07/14/21 0836  NA 135   < > 133*  133* 129* 129* 127* 126*  K 4.4   < > 4.8  4.7 4.1 4.4 6.4* 6.9*  CL 97*   < > 94*  94* 91* 91* 93* 92*  CO2 27   < > 24  24 24 26 23  20*  GLUCOSE 134*   < > 369*  368* 500* 491* 417* 507*  BUN 47*   < > 66*  67* 25* 30* 57* 63*  CREATININE 6.38*   < > 7.28*  7.26* 3.72* 4.02* 5.40* 5.80*  CALCIUM 9.4   < > 9.4  9.3 8.4* 8.6* 8.7* 9.0  MG 2.3  --   --   --   --   --   --   PHOS  --   --  6.9*  6.7*  --   --   --   --    < > = values in this interval not displayed.    Liver Function Tests: Recent Labs  Lab 07/11/21 1407 07/12/21 1503 07/13/21 0404  AST 17  --  26  ALT 15  --  17  ALKPHOS 81  --  70  BILITOT 0.6  --  0.5  PROT 7.0  --  6.5  ALBUMIN 3.3* 3.3*  3.4* 3.0*   No results  for input(s): LIPASE, AMYLASE in the last 168 hours. No results for input(s): AMMONIA in the last 168 hours.  CBC: Recent Labs  Lab 07/11/21 1407 07/12/21 1129 07/12/21 1503 07/13/21 0404 07/14/21 0623  WBC 10.5 13.1* 15.8* 14.9* 10.1  NEUTROABS 6.9  --   --   --  8.8*  HGB 12.1 11.0* 11.5* 10.1* 10.5*  HCT 37.4 34.5* 36.3 31.3* 33.5*  MCV 108.1* 105.5* 106.8* 107.2* 110.9*  PLT 200 175 225 204 192    Cardiac Enzymes: No results for input(s): CKTOTAL, CKMB, CKMBINDEX, TROPONINI in the last 168 hours.  BNP: Invalid input(s): POCBNP  CBG: Recent Labs  Lab 07/13/21 1651 07/13/21 2031 07/14/21 0719 07/14/21 0943 07/14/21 1053  GLUCAP 123* 239* 419* 496* 462*    Microbiology: Results for orders placed or performed during the hospital  encounter of 07/12/21  Respiratory (~20 pathogens) panel by PCR     Status: Abnormal   Collection Time: 07/13/21  1:20 AM   Specimen: Nasopharyngeal Swab; Respiratory  Result Value Ref Range Status   Adenovirus NOT DETECTED NOT DETECTED Final   Coronavirus 229E NOT DETECTED NOT DETECTED Final    Comment: (NOTE) The Coronavirus on the Respiratory Panel, DOES NOT test for the novel  Coronavirus (2019 nCoV)    Coronavirus HKU1 NOT DETECTED NOT DETECTED Final   Coronavirus NL63 NOT DETECTED NOT DETECTED Final   Coronavirus OC43 NOT DETECTED NOT DETECTED Final   Metapneumovirus NOT DETECTED NOT DETECTED Final   Rhinovirus / Enterovirus DETECTED (A) NOT DETECTED Final   Influenza A NOT DETECTED NOT DETECTED Final   Influenza B NOT DETECTED NOT DETECTED Final   Parainfluenza Virus 1 NOT DETECTED NOT DETECTED Final   Parainfluenza Virus 2 NOT DETECTED NOT DETECTED Final   Parainfluenza Virus 3 NOT DETECTED NOT DETECTED Final   Parainfluenza Virus 4 NOT DETECTED NOT DETECTED Final   Respiratory Syncytial Virus NOT DETECTED NOT DETECTED Final   Bordetella pertussis NOT DETECTED NOT DETECTED Final   Bordetella Parapertussis NOT DETECTED NOT  DETECTED Final   Chlamydophila pneumoniae NOT DETECTED NOT DETECTED Final   Mycoplasma pneumoniae NOT DETECTED NOT DETECTED Final    Comment: Performed at [facility], [address], [place], [place] [address]    Coagulation Studies: No results for input(s): LABPROT, INR in the last 72 hours.  Urinalysis: No results for input(s): COLORURINE, LABSPEC, PHURINE, GLUCOSEU, HGBUR, BILIRUBINUR, KETONESUR, PROTEINUR, UROBILINOGEN, NITRITE, LEUKOCYTESUR in the last 72 hours.  Invalid input(s): APPERANCEUR    Imaging: DG Chest Port 1 View  Result Date: 07/12/2021 CLINICAL DATA:  Shortness of breath, chest pain. Diagnosed with bronchitis yesterday EXAM: PORTABLE CHEST 1 VIEW COMPARISON:  Chest radiograph dated 1 day prior FINDINGS: A tunneled right-sided central venous catheter is in stable position. Median sternotomy wires are stable. A left subclavian stent is again noted. The heart size is prominent, unchanged. The mediastinal contours are stable. There is no focal consolidation or pulmonary edema. There is no pleural effusion or pneumothorax. Overall, aeration of the lungs is unchanged. The bones are stable. IMPRESSION: Stable chest with no radiographic evidence of acute cardiopulmonary process. Electronically Signed   By: Valetta Mole M.D.   On: 07/12/2021 12:19     Medications:    sodium chloride     sodium chloride     azithromycin Stopped (07/13/21 2328)    acetaminophen  650 mg Oral Once   allopurinol  100 mg Oral Q T,Th,Sa-HD   aspirin EC  81 mg Oral Daily   atorvastatin  80 mg Oral QPM   Chlorhexidine Gluconate Cloth  6 each Topical Q0600   clopidogrel  75 mg Oral Daily   guaiFENesin  600 mg Oral BID   heparin injection (subcutaneous)  5,000 Units Subcutaneous Q8H   insulin aspart  0-15 Units Subcutaneous TID WC   insulin aspart  0-5 Units Subcutaneous QHS   insulin aspart  4 Units Subcutaneous TID WC   insulin aspart protamine- aspart  30 Units Subcutaneous BID WC    ipratropium-albuterol  3 mL Nebulization Q6H   methylPREDNISolone (SOLU-MEDROL) injection  125 mg Intravenous Q12H   pantoprazole  40 mg Oral BID   sodium zirconium cyclosilicate  5 g Oral Daily   sodium chloride, sodium chloride, albuterol, ALPRAZolam, alteplase, chlorpheniramine-HYDROcodone, cyclobenzaprine, heparin, heparin, lidocaine (PF), lidocaine-prilocaine, pentafluoroprop-tetrafluoroeth  Assessment/ Plan:  Dialysis Orders: Center: [facility]  on TTS . EDW 94.5kg HD Bath 2K/2.5Ca  Time 4:15 Heparin 2000 units. Access RIJ TDC, maturing RUE AVF BFR 400 DFR 800    Micera 50 mcg IVP every 4 weeks  Assessment/Plan:  Bronchitis - pt presented with shortness of breath with cough and congestion - respiratory panel negative.  Failed outpatient therapy.  Plan primary svc  ESRD -  continue with HD on TTS schedule. Dialysis planned 10/27  Hypertension/volume  - UF as tolerated  Anemia  - stable, no ESA  Metabolic bone disease -  resume home meds  Nutrition - renal diet, carb modified CAD s/p recent NSTEMI and PCI- stable at present.  Vascular access - maturing RUE AVF, wrist band in place    LOS: 1 Donna Howe @TODAY @11 :23 AM

## 2021-07-14 NOTE — Progress Notes (Signed)
Date and time results received: 07/14/21 0900 Test: potassium Critical Value: 6.9 Name of Provider Notified: Dr. Erlinda Hong Awaiting new orders Deirdre Pippins, RN

## 2021-07-15 ENCOUNTER — Ambulatory Visit (HOSPITAL_COMMUNITY): Payer: Medicare HMO

## 2021-07-15 ENCOUNTER — Telehealth: Payer: Self-pay

## 2021-07-15 DIAGNOSIS — Z992 Dependence on renal dialysis: Secondary | ICD-10-CM | POA: Diagnosis not present

## 2021-07-15 DIAGNOSIS — I1311 Hypertensive heart and chronic kidney disease without heart failure, with stage 5 chronic kidney disease, or end stage renal disease: Secondary | ICD-10-CM | POA: Diagnosis not present

## 2021-07-15 DIAGNOSIS — J42 Unspecified chronic bronchitis: Secondary | ICD-10-CM | POA: Diagnosis not present

## 2021-07-15 DIAGNOSIS — N186 End stage renal disease: Secondary | ICD-10-CM | POA: Diagnosis not present

## 2021-07-15 DIAGNOSIS — R0602 Shortness of breath: Secondary | ICD-10-CM | POA: Diagnosis not present

## 2021-07-15 DIAGNOSIS — I2510 Atherosclerotic heart disease of native coronary artery without angina pectoris: Secondary | ICD-10-CM | POA: Diagnosis not present

## 2021-07-15 DIAGNOSIS — D631 Anemia in chronic kidney disease: Secondary | ICD-10-CM | POA: Diagnosis not present

## 2021-07-15 DIAGNOSIS — J9600 Acute respiratory failure, unspecified whether with hypoxia or hypercapnia: Secondary | ICD-10-CM | POA: Diagnosis not present

## 2021-07-15 DIAGNOSIS — E1122 Type 2 diabetes mellitus with diabetic chronic kidney disease: Secondary | ICD-10-CM | POA: Diagnosis not present

## 2021-07-15 DIAGNOSIS — N250 Renal osteodystrophy: Secondary | ICD-10-CM | POA: Diagnosis not present

## 2021-07-15 DIAGNOSIS — E1129 Type 2 diabetes mellitus with other diabetic kidney complication: Secondary | ICD-10-CM | POA: Diagnosis not present

## 2021-07-15 DIAGNOSIS — J45901 Unspecified asthma with (acute) exacerbation: Secondary | ICD-10-CM | POA: Diagnosis not present

## 2021-07-15 LAB — GLUCOSE, CAPILLARY
Glucose-Capillary: 327 mg/dL — ABNORMAL HIGH (ref 70–99)
Glucose-Capillary: 277 mg/dL — ABNORMAL HIGH (ref 70–99)

## 2021-07-15 LAB — BASIC METABOLIC PANEL
Anion gap: 10 (ref 5–15)
BUN: 35 mg/dL — ABNORMAL HIGH (ref 8–23)
CO2: 28 mmol/L (ref 22–32)
Calcium: 8.5 mg/dL — ABNORMAL LOW (ref 8.9–10.3)
Chloride: 91 mmol/L — ABNORMAL LOW (ref 98–111)
Creatinine, Ser: 3.18 mg/dL — ABNORMAL HIGH (ref 0.44–1.00)
GFR, Estimated: 16 mL/min — ABNORMAL LOW (ref >60)
Glucose, Bld: 350 mg/dL — ABNORMAL HIGH (ref 70–99)
Potassium: 4.7 mmol/L (ref 3.5–5.1)
Sodium: 129 mmol/L — ABNORMAL LOW (ref 135–145)

## 2021-07-15 LAB — MAGNESIUM: Magnesium: 2.1 mg/dL (ref 1.7–2.4)

## 2021-07-15 MED ORDER — ALBUTEROL SULFATE (2.5 MG/3ML) 0.083% IN NEBU: 2.5000 mg | INHALATION_SOLUTION | RESPIRATORY_TRACT | 12 refills | Status: AC | PRN

## 2021-07-15 MED ORDER — CHLORHEXIDINE GLUCONATE CLOTH 2 % EX PADS
6.0000 | MEDICATED_PAD | Freq: Every day | CUTANEOUS | Status: DC
  Administered 2021-07-15: 6 via TOPICAL

## 2021-07-15 MED ORDER — HYDROCOD POLST-CPM POLST ER 10-8 MG/5ML PO SUER: 5.0000 mL | Freq: Two times a day (BID) | ORAL | 0 refills | Status: DC | PRN

## 2021-07-15 MED ORDER — PREDNISONE 20 MG PO TABS: 40.0000 mg | ORAL_TABLET | Freq: Every day | ORAL | 0 refills | Status: AC

## 2021-07-15 MED ORDER — GUAIFENESIN ER 600 MG PO TB12: 600.0000 mg | ORAL_TABLET | Freq: Two times a day (BID) | ORAL | 0 refills | Status: DC

## 2021-07-15 MED ORDER — AZITHROMYCIN 250 MG PO TABS: ORAL_TABLET | ORAL | 0 refills | Status: AC

## 2021-07-15 MED ORDER — NOVOLIN 70/30 FLEXPEN (70-30) 100 UNIT/ML ~~LOC~~ SUPN: 35.0000 [IU] | PEN_INJECTOR | Freq: Two times a day (BID) | SUBCUTANEOUS | 11 refills | Status: AC

## 2021-07-15 NOTE — TOC Progression Note (Signed)
Transition of Care [facility]) - Progression Note    Patient Details  Name: Donna Howe MRN: [id number] Date of Birth: 1955-11-07  [facility] CM/SW Contact  Ihor Gully, LCSW Phone Number: 07/15/2021, 1:00 PM  Clinical Narrative:    Patient in need of nebulizer. DME companies discussed with patient. Referral made to [facility] at [place].    Expected Discharge Plan: Home/Self Care Barriers to Discharge: Continued Medical Work up  Expected Discharge Plan and Services Expected Discharge Plan: Home/Self Care In-house Referral: Clinical Social Work     Living arrangements for the past 2 months: Single Family Home Expected Discharge Date: 07/15/21               DME Arranged: Nebulizer machine DME Agency: [employer] Date DME Agency Contacted: 07/15/21 Time DME Agency Contacted: [phone] Representative spoke with at DME Agency: [place] ([place]) Interventions    Readmission Risk Interventions Readmission Risk Prevention Plan 07/14/2021 06/15/2021 02/25/2021  Transportation Screening Complete Complete Complete  Medication Review Press photographer) Complete - Complete  PCP or Specialist appointment within 3-5 days of discharge - Complete -  [place] or [place] Complete Complete Complete  SW Recovery Care/Counseling Consult Complete Complete Complete  Palliative Care Screening Not Applicable Not Applicable Not [place] Not Applicable Not Applicable Not Applicable  Some recent data might be hidden

## 2021-07-15 NOTE — Telephone Encounter (Signed)
Front desk lead has been messaged to request an opening on the schedule for 07/22/21 Friday at 2 pm for patient. Has been made aware to open only this slot.

## 2021-07-15 NOTE — Progress Notes (Signed)
Nsg Discharge Note  Admit Date:  07/12/2021 Discharge date: 07/15/2021   Rae Roam Whitehorn to be D/C'd Home per MD order.  AVS completed. Patient/caregiver able to verbalize understanding.  Discharge Medication: Allergies as of 07/15/2021       Reactions   Penicillins Other (See Comments)   REACTION: Unknown, told as a child Has patient had a PCN reaction causing immediate rash, facial/tongue/throat swelling, SOB or lightheadedness with hypotension: Unknown Has patient had a PCN reaction causing severe rash involving mucus membranes or skin necrosis: Unknown Has patient had a PCN reaction that required hospitalization: Unknown Has patient had a PCN reaction occurring within the last 10 years: No If all of the above answers are "NO", then may proceed with Cephalosporin use.   Beta Adrenergic Blockers Other (See Comments)   Hypotension        Medication List     STOP taking these medications    doxycycline 100 MG capsule Commonly known as: VIBRAMYCIN       TAKE these medications    Albuterol Sulfate 108 (90 Base) MCG/ACT Aepb Commonly known as: PROAIR RESPICLICK Inhale 2 puffs into the lungs every 6 (six) hours as needed (Shortness of breath). What changed: Another medication with the same name was added. Make sure you understand how and when to take each.   albuterol (2.5 MG/3ML) 0.083% nebulizer solution Commonly known as: PROVENTIL Take 3 mLs (2.5 mg total) by nebulization every 2 (two) hours as needed for wheezing. What changed: You were already taking a medication with the same name, and this prescription was added. Make sure you understand how and when to take each.   allopurinol 100 MG tablet Commonly known as: Zyloprim Take one tablet after hemodialysis, on Tuesday, Thursday and Saturday. What changed:  how much to take how to take this when to take this additional instructions   ALPRAZolam 0.5 MG tablet Commonly known as: XANAX Take 0.25-0.5 mg by  mouth See admin instructions. Take 0.25 in the morning and evening and 0.5 mg at bedtime   aspirin EC 81 MG tablet Take 81 mg by mouth daily.   atorvastatin 80 MG tablet Commonly known as: LIPITOR Take 1 tablet (80 mg total) by mouth every evening. What changed: when to take this   Auryxia 1 GM 210 MG(Fe) tablet Generic drug: ferric citrate Take 420 mg by mouth 3 (three) times daily with meals.   azithromycin 250 MG tablet Commonly known as: Zithromax Z-Pak Take 2 tablets (500 mg) on  Day 1,  followed by 1 tablet (250 mg) once daily on Days 2 through 5.   chlorpheniramine-HYDROcodone 10-8 MG/5ML Suer Commonly known as: TUSSIONEX Take 5 mLs by mouth every 12 (twelve) hours as needed for cough.   clopidogrel 75 MG tablet Commonly known as: PLAVIX Take 1 tablet (75 mg total) by mouth daily.   cyclobenzaprine 5 MG tablet Commonly known as: FLEXERIL Take 1 tablet (5 mg total) by mouth 2 (two) times daily with a meal. What changed: Another medication with the same name was removed. Continue taking this medication, and follow the directions you see here.   diclofenac Sodium 1 % Gel Commonly known as: VOLTAREN Apply topically. What changed: Another medication with the same name was removed. Continue taking this medication, and follow the directions you see here.   ezetimibe 10 MG tablet Commonly known as: ZETIA Take 1 tablet (10 mg total) by mouth daily. What changed: when to take this   gabapentin 400 MG capsule Commonly  known as: NEURONTIN Take 1 capsule (400 mg total) by mouth at bedtime.   guaiFENesin 600 MG 12 hr tablet Commonly known as: MUCINEX Take 1 tablet (600 mg total) by mouth 2 (two) times daily.   ipratropium 17 MCG/ACT inhaler Commonly known as: ATROVENT HFA Inhale 2 puffs into the lungs every 4 (four) hours as needed for wheezing.   levothyroxine 112 MCG tablet Commonly known as: SYNTHROID Take 112 mcg by mouth daily before breakfast.   midodrine 10 MG  tablet Commonly known as: PROAMATINE Take 1 tablet (10 mg total) by mouth 2 (two) times daily with a meal.   MIRCERA IJ Mircera   multivitamin Tabs tablet Take 1 tablet by mouth daily.   nitroGLYCERIN 0.4 MG SL tablet Commonly known as: NITROSTAT Place 1 tablet (0.4 mg total) under the tongue every 5 (five) minutes x 3 doses as needed for chest pain.   NovoLIN 70/30 Kwikpen (70-30) 100 UNIT/ML KwikPen Generic drug: insulin isophane & regular human KwikPen Inject 35 Units into the skin in the morning and at bedtime. What changed: how much to take   ondansetron 8 MG tablet Commonly known as: ZOFRAN Take 8 mg by mouth every 8 (eight) hours as needed for nausea.   OneTouch Ultra test strip Generic drug: glucose blood Check blood glucose THREE TIMES DAILY   pantoprazole 40 MG tablet Commonly known as: PROTONIX Take 1 tablet (40 mg total) by mouth 2 (two) times daily.   predniSONE 20 MG tablet Commonly known as: DELTASONE Take 2 tablets (40 mg total) by mouth daily for 3 days. What changed:  medication strength how much to take   ranolazine 500 MG 12 hr tablet Commonly known as: RANEXA Take 1 tablet (500 mg total) by mouth at bedtime.   Vitamin D (Ergocalciferol) 1.25 MG (50000 UNIT) Caps capsule Commonly known as: DRISDOL Take 50,000 Units by mouth every Sunday.               Durable Medical Equipment  (From admission, onward)           Start     Ordered   07/15/21 1221  For home use only DME Nebulizer machine  Once       Question Answer Comment  Patient needs a nebulizer to treat with the following condition Bronchitis   Length of Need Lifetime      10 /28/22 1220            Discharge Assessment: Vitals:   07/15/21 0822 07/15/21 1319  BP:  (!) 105/41  Pulse:  83  Resp:  16  Temp:  98.5 F (36.9 C)  SpO2: 100% 98%   Skin clean, dry and intact without evidence of skin break down, no evidence of skin tears noted. IV catheter discontinued  intact. Site without signs and symptoms of complications - no redness or edema noted at insertion site, patient denies c/o pain - only slight tenderness at site.  Dressing with slight pressure applied.  D/c Instructions-Education: Discharge instructions given to patient/family with verbalized understanding. D/c education completed with patient/family including follow up instructions, medication list, d/c activities limitations if indicated, with other d/c instructions as indicated by MD - patient able to verbalize understanding, all questions fully answered. Patient instructed to return to ED, call 911, or call MD for any changes in condition.  Patient escorted via [place], and D/C home via private auto.  Kathie Rhodes, LPN 81/77/1165 7:90 PM

## 2021-07-15 NOTE — Discharge Summary (Addendum)
Physician Discharge Summary  Donna Howe DDU:[id number] DOB: [birth year] DOA: 07/12/2021  PCP: Curlene Labrum, MD  Admit date: 07/12/2021 Discharge date: 07/15/2021  Admitted From: Home Disposition: Home  Recommendations for Outpatient Follow-up:  Follow up with PCP in 1-2 weeks Please obtain BMP/CBC in one week Previously seen pulmonology, Dr. Melvyn Novas.  Follow-up has been requested She will return to her regular dialysis center tomorrow for routine dialysis  Home Health: Equipment/Devices: Nebulizer machine  Discharge Condition: Stable CODE STATUS: Full code Diet recommendation: Heart healthy, carb modified  Brief/Interim Summary: 65 year old female with history of end-stage renal disease, coronary artery disease, history of possible asthma/COPD, admitted with wheezing/bronchoconstriction likely precipitated by acute bronchitis.  Discharge Diagnoses:  Active Problems:   Asthma, chronic, unspecified asthma severity, with acute exacerbation   ESRD (end stage renal disease) (HCC)   COPD (chronic obstructive pulmonary disease) (HCC)   Insulin dependent type 2 diabetes mellitus ([place])  Acute exacerbation of asthma/COPD, likely precipitated by bronchitis -Was initially seen in the emergency room on 10/24 and was sent home with antibiotic and steroids, unfortunately her symptoms persisted, wheezing got worse and she had to be admitted -Found to be negative for COVID -Admitted for IV steroids, bronchodilators, antibiotics -CT chest showed evidence of pneumonia -With steroids and bronchodilators, overall respiratory status has improved and wheezing is now resolved -She reports using only albuterol on an as-needed basis -?  If she would benefit from steroid inhaler -She was previously seen pulmonology, but her last visit appears to be in 2021 -We will request follow-up with her primary pulmonologist to see if steroid inhaler would be helpful  CAD status post CABG -No chest pain  at this time -Continue aspirin, Plavix and Ranexa  ESRD -Continued on dialysis per schedule  Diabetes -Hyperglycemia related to steroids, anticipate this should improve as steroids are tapered -Insulin has been adjusted  Chronic hypotension -Continue on midodrine  Discharge Instructions  Discharge Instructions     Ambulatory referral to Pulmonology   Complete by: As directed    Follow up   Reason for referral: Asthma/COPD   Diet - low sodium heart healthy   Complete by: As directed    Increase activity slowly   Complete by: As directed    No wound care   Complete by: As directed       Allergies as of 07/15/2021       Reactions   Penicillins Other (See Comments)   REACTION: Unknown, told as a child Has patient had a PCN reaction causing immediate rash, facial/tongue/throat swelling, SOB or lightheadedness with hypotension: Unknown Has patient had a PCN reaction causing severe rash involving mucus membranes or skin necrosis: Unknown Has patient had a PCN reaction that required hospitalization: Unknown Has patient had a PCN reaction occurring within the last 10 years: No If all of the above answers are "NO", then may proceed with Cephalosporin use.   Beta Adrenergic Blockers Other (See Comments)   Hypotension        Medication List     STOP taking these medications    doxycycline 100 MG capsule Commonly known as: VIBRAMYCIN       TAKE these medications    Albuterol Sulfate 108 (90 Base) MCG/ACT Aepb Commonly known as: PROAIR RESPICLICK Inhale 2 puffs into the lungs every 6 (six) hours as needed (Shortness of breath). What changed: Another medication with the same name was added. Make sure you understand how and when to take each.   albuterol (2.5 MG/3ML) 0.083%  nebulizer solution Commonly known as: PROVENTIL Take 3 mLs (2.5 mg total) by nebulization every 2 (two) hours as needed for wheezing. What changed: You were already taking a medication with the  same name, and this prescription was added. Make sure you understand how and when to take each.   allopurinol 100 MG tablet Commonly known as: Zyloprim Take one tablet after hemodialysis, on Tuesday, Thursday and Saturday. What changed:  how much to take how to take this when to take this additional instructions   ALPRAZolam 0.5 MG tablet Commonly known as: XANAX Take 0.25-0.5 mg by mouth See admin instructions. Take 0.25 in the morning and evening and 0.5 mg at bedtime   aspirin EC 81 MG tablet Take 81 mg by mouth daily.   atorvastatin 80 MG tablet Commonly known as: LIPITOR Take 1 tablet (80 mg total) by mouth every evening. What changed: when to take this   Auryxia 1 GM 210 MG(Fe) tablet Generic drug: ferric citrate Take 420 mg by mouth 3 (three) times daily with meals.   azithromycin 250 MG tablet Commonly known as: Zithromax Z-Pak Take 2 tablets (500 mg) on  Day 1,  followed by 1 tablet (250 mg) once daily on Days 2 through 5.   chlorpheniramine-HYDROcodone 10-8 MG/5ML Suer Commonly known as: TUSSIONEX Take 5 mLs by mouth every 12 (twelve) hours as needed for cough.   clopidogrel 75 MG tablet Commonly known as: PLAVIX Take 1 tablet (75 mg total) by mouth daily.   cyclobenzaprine 5 MG tablet Commonly known as: FLEXERIL Take 1 tablet (5 mg total) by mouth 2 (two) times daily with a meal. What changed: Another medication with the same name was removed. Continue taking this medication, and follow the directions you see here.   diclofenac Sodium 1 % Gel Commonly known as: VOLTAREN Apply topically. What changed: Another medication with the same name was removed. Continue taking this medication, and follow the directions you see here.   ezetimibe 10 MG tablet Commonly known as: ZETIA Take 1 tablet (10 mg total) by mouth daily. What changed: when to take this   gabapentin 400 MG capsule Commonly known as: NEURONTIN Take 1 capsule (400 mg total) by mouth at  bedtime.   guaiFENesin 600 MG 12 hr tablet Commonly known as: MUCINEX Take 1 tablet (600 mg total) by mouth 2 (two) times daily.   ipratropium 17 MCG/ACT inhaler Commonly known as: ATROVENT HFA Inhale 2 puffs into the lungs every 4 (four) hours as needed for wheezing.   levothyroxine 112 MCG tablet Commonly known as: SYNTHROID Take 112 mcg by mouth daily before breakfast.   midodrine 10 MG tablet Commonly known as: PROAMATINE Take 1 tablet (10 mg total) by mouth 2 (two) times daily with a meal.   MIRCERA IJ Mircera   multivitamin Tabs tablet Take 1 tablet by mouth daily.   nitroGLYCERIN 0.4 MG SL tablet Commonly known as: NITROSTAT Place 1 tablet (0.4 mg total) under the tongue every 5 (five) minutes x 3 doses as needed for chest pain.   NovoLIN 70/30 Kwikpen (70-30) 100 UNIT/ML KwikPen Generic drug: insulin isophane & regular human KwikPen Inject 35 Units into the skin in the morning and at bedtime. What changed: how much to take   ondansetron 8 MG tablet Commonly known as: ZOFRAN Take 8 mg by mouth every 8 (eight) hours as needed for nausea.   OneTouch Ultra test strip Generic drug: glucose blood Check blood glucose THREE TIMES DAILY   pantoprazole 40 MG  tablet Commonly known as: PROTONIX Take 1 tablet (40 mg total) by mouth 2 (two) times daily.   predniSONE 20 MG tablet Commonly known as: DELTASONE Take 2 tablets (40 mg total) by mouth daily for 3 days. What changed:  medication strength how much to take   ranolazine 500 MG 12 hr tablet Commonly known as: RANEXA Take 1 tablet (500 mg total) by mouth at bedtime.   Vitamin D (Ergocalciferol) 1.25 MG (50000 UNIT) Caps capsule Commonly known as: DRISDOL Take 50,000 Units by mouth every Sunday.        Follow-up Information     Burdine, Virgina Evener, MD. Schedule an appointment as soon as possible for a visit in 2 week(s).   Specialty: Family Medicine Contact information: [address] [place]  [address] [phone]         Tanda Rockers, MD Follow up.   Specialty: Pulmonary Disease Why: office will contact you with appointment Contact information: [address] [place] [address] [phone]                Allergies  Allergen Reactions   Penicillins Other (See Comments)    REACTION: Unknown, told as a child Has patient had a PCN reaction causing immediate rash, facial/tongue/throat swelling, SOB or lightheadedness with hypotension: Unknown Has patient had a PCN reaction causing severe rash involving mucus membranes or skin necrosis: Unknown Has patient had a PCN reaction that required hospitalization: Unknown Has patient had a PCN reaction occurring within the last 10 years: No If all of the above answers are "NO", then may proceed with Cephalosporin use.    Beta Adrenergic Blockers Other (See Comments)    Hypotension    Consultations: Nephrology   Procedures/Studies: CT CHEST WO CONTRAST  Result Date: 07/14/2021 CLINICAL DATA:  Persistent cough. EXAM: CT CHEST WITHOUT CONTRAST TECHNIQUE: Multidetector CT imaging of the chest was performed following the standard protocol without IV contrast. COMPARISON:  Chest CT dated 04/20/2021. FINDINGS: Evaluation of this exam is limited in the absence of intravenous contrast. Cardiovascular: There is no cardiomegaly or pericardial effusion. There is coronary vascular calcification and postsurgical changes of CABG. Right sided dialysis catheter with tip at the cavoatrial junction. Moderate atherosclerotic calcification of the thoracic aorta. No aneurysmal dilatation. The central pulmonary arteries are grossly unremarkable. Mediastinum/Nodes: No hilar or mediastinal adenopathy. The esophagus is grossly unremarkable. No mediastinal fluid collection. Lungs/Pleura: Scattered clusters of ground-glass nodular densities in the right lung most consistent with pneumonia, likely atypical in etiology. Clinical  correlation and follow-up recommended. There is a 5 mm right lower lobe subpleural nodule (84/4). The left lung is clear. No pleural effusion pneumothorax. The central airways are patent. Upper Abdomen: Cholecystectomy. Musculoskeletal: Median sternotomy wires. No acute osseous pathology. IMPRESSION: 1. Scattered clusters of ground-glass nodular densities in the right lung most consistent with pneumonia, likely atypical in etiology. 2. There is a 5 mm right lower lobe subpleural nodule.No follow-up needed if patient is low-risk (and has no known or suspected primary neoplasm). Non-contrast chest CT can be considered in 12 months if patient is high-risk. This recommendation follows the consensus statement: Guidelines for Management of Incidental Pulmonary Nodules Detected on CT Images: From the Fleischner Society 2017; Radiology 2017; 284:228-243. 3. Aortic Atherosclerosis (ICD10-I70.0). Electronically Signed   By: Anner Crete M.D.   On: 07/14/2021 19:53   DG Chest Port 1 View  Result Date: 07/12/2021 CLINICAL DATA:  Shortness of breath, chest pain. Diagnosed with bronchitis yesterday EXAM:  PORTABLE CHEST 1 VIEW COMPARISON:  Chest radiograph dated 1 day prior FINDINGS: A tunneled right-sided central venous catheter is in stable position. Median sternotomy wires are stable. A left subclavian stent is again noted. The heart size is prominent, unchanged. The mediastinal contours are stable. There is no focal consolidation or pulmonary edema. There is no pleural effusion or pneumothorax. Overall, aeration of the lungs is unchanged. The bones are stable. IMPRESSION: Stable chest with no radiographic evidence of acute cardiopulmonary process. Electronically Signed   By: Valetta Mole M.D.   On: 07/12/2021 12:19   DG Chest Portable 1 View  Result Date: 07/11/2021 CLINICAL DATA:  Upper back pain,SOB, cough EXAM: PORTABLE CHEST 1 VIEW COMPARISON:  06/13/2021 FINDINGS: Right IJ approach central line is again  noted. No new consolidation or edema. No pleural effusion. Stable cardiomediastinal contours. Left subclavian origin stent. IMPRESSION: No acute process in the chest. Electronically Signed   By: Macy Mis M.D.   On: 07/11/2021 13:25      Subjective: She is feeling better.  Wheezing has improved.  Able to ambulate without shortness of breath.  Discharge Exam: Vitals:   07/15/21 0218 07/15/21 0552 07/15/21 0822 07/15/21 1319  BP:  (!) 100/53  (!) 105/41  Pulse:  78  83  Resp:    16  Temp:  97.8 F (36.6 C)  98.5 F (36.9 C)  TempSrc:  Oral    SpO2: 94% 99% 100% 98%  Weight:      Height:        General: Pt is alert, awake, not in acute distress Cardiovascular: RRR, S1/S2 +, no rubs, no gallops Respiratory: CTA bilaterally, no wheezing, no rhonchi Abdominal: Soft, NT, ND, bowel sounds + Extremities: no edema, no cyanosis    The results of significant diagnostics from this hospitalization (including imaging, microbiology, ancillary and laboratory) are listed below for reference.     Microbiology: Recent Results (from the past 240 hour(s))  Resp Panel by RT-PCR (Flu A&B, Covid) Nasopharyngeal Swab     Status: None   Collection Time: 07/11/21  4:02 PM   Specimen: Nasopharyngeal Swab; Nasopharyngeal(NP) swabs in vial transport medium  Result Value Ref Range Status   SARS Coronavirus 2 by RT PCR NEGATIVE NEGATIVE Final    Comment: (NOTE) SARS-CoV-2 target nucleic acids are NOT DETECTED.  The SARS-CoV-2 RNA is generally detectable in upper respiratory specimens during the acute phase of infection. The lowest concentration of SARS-CoV-2 viral copies this assay can detect is 138 copies/mL. A negative result does not preclude SARS-Cov-2 infection and should not be used as the sole basis for treatment or other patient management decisions. A negative result may occur with  improper specimen collection/handling, submission of specimen other than nasopharyngeal swab, presence  of viral mutation(s) within the areas targeted by this assay, and inadequate number of viral copies(<138 copies/mL). A negative result must be combined with clinical observations, patient history, and epidemiological information. The expected result is Negative.  Fact Sheet for Patients:  EntrepreneurPulse.com.au  Fact Sheet for Healthcare Providers:  IncredibleEmployment.be  This test is no t yet approved or cleared by the [place] FDA and  has been authorized for detection and/or diagnosis of SARS-CoV-2 by FDA under an Emergency Use Authorization (EUA). This EUA will remain  in effect (meaning this test can be used) for the duration of the COVID-19 declaration under Section 564(b)(1) of the Act, 21 U.S.C.section 360bbb-3(b)(1), unless the authorization is terminated  or revoked sooner.  Influenza A by PCR NEGATIVE NEGATIVE Final   Influenza B by PCR NEGATIVE NEGATIVE Final    Comment: (NOTE) The Xpert Xpress SARS-CoV-2/FLU/RSV plus assay is intended as an aid in the diagnosis of influenza from Nasopharyngeal swab specimens and should not be used as a sole basis for treatment. Nasal washings and aspirates are unacceptable for Xpert Xpress SARS-CoV-2/FLU/RSV testing.  Fact Sheet for Patients: EntrepreneurPulse.com.au  Fact Sheet for Healthcare Providers: IncredibleEmployment.be  This test is not yet approved or cleared by the [place] FDA and has been authorized for detection and/or diagnosis of SARS-CoV-2 by FDA under an Emergency Use Authorization (EUA). This EUA will remain in effect (meaning this test can be used) for the duration of the COVID-19 declaration under Section 564(b)(1) of the Act, 21 U.S.C. section 360bbb-3(b)(1), unless the authorization is terminated or revoked.  Performed at [facility], [address], [place], [place] [address]   Respiratory (~20 pathogens) panel  by PCR     Status: Abnormal   Collection Time: 07/13/21  1:20 AM   Specimen: Nasopharyngeal Swab; Respiratory  Result Value Ref Range Status   Adenovirus NOT DETECTED NOT DETECTED Final   Coronavirus 229E NOT DETECTED NOT DETECTED Final    Comment: (NOTE) The Coronavirus on the Respiratory Panel, DOES NOT test for the novel  Coronavirus (2019 nCoV)    Coronavirus HKU1 NOT DETECTED NOT DETECTED Final   Coronavirus NL63 NOT DETECTED NOT DETECTED Final   Coronavirus OC43 NOT DETECTED NOT DETECTED Final   Metapneumovirus NOT DETECTED NOT DETECTED Final   Rhinovirus / Enterovirus DETECTED (A) NOT DETECTED Final   Influenza A NOT DETECTED NOT DETECTED Final   Influenza B NOT DETECTED NOT DETECTED Final   Parainfluenza Virus 1 NOT DETECTED NOT DETECTED Final   Parainfluenza Virus 2 NOT DETECTED NOT DETECTED Final   Parainfluenza Virus 3 NOT DETECTED NOT DETECTED Final   Parainfluenza Virus 4 NOT DETECTED NOT DETECTED Final   Respiratory Syncytial Virus NOT DETECTED NOT DETECTED Final   Bordetella pertussis NOT DETECTED NOT DETECTED Final   Bordetella Parapertussis NOT DETECTED NOT DETECTED Final   Chlamydophila pneumoniae NOT DETECTED NOT DETECTED Final   Mycoplasma pneumoniae NOT DETECTED NOT DETECTED Final    Comment: Performed at [facility], [address], [place], [place] [address]     Labs: BNP (last 3 results) Recent Labs    05/28/21 1129 07/11/21 1408  BNP 205.0* 735.3*   Basic Metabolic Panel: Recent Labs  Lab 07/11/21 1407 07/12/21 1129 07/12/21 1503 07/13/21 0007 07/13/21 0404 07/14/21 0623 07/14/21 0836 07/14/21 1215 07/15/21 0538  NA 135   < > 133*  133*   < > 129* 127* 126* 125* 129*  K 4.4   < > 4.8  4.7   < > 4.4 6.4* 6.9* 4.7 4.7  CL 97*   < > 94*  94*   < > 91* 93* 92* 92* 91*  CO2 27   < > 24  24   < > 26 23 20* 22 28  GLUCOSE 134*   < > 369*  368*   < > 491* 417* 507* 437* 350*  BUN 47*   < > 66*  67*   < > 30* 57* 63* 66* 35*   CREATININE 6.38*   < > 7.28*  7.26*   < > 4.02* 5.40* 5.80* 6.02* 3.18*  CALCIUM 9.4   < > 9.4  9.3   < > 8.6* 8.7* 9.0 8.8* 8.5*  MG  2.3  --   --   --   --   --   --   --  2.1  PHOS  --   --  6.9*  6.7*  --   --   --   --  5.6*  --    < > = values in this interval not displayed.   Liver Function Tests: Recent Labs  Lab 07/11/21 1407 07/12/21 1503 07/13/21 0404 07/14/21 1215  AST 17  --  26  --   ALT 15  --  17  --   ALKPHOS 81  --  70  --   BILITOT 0.6  --  0.5  --   PROT 7.0  --  6.5  --   ALBUMIN 3.3* 3.3*  3.4* 3.0* 3.1*   No results for input(s): LIPASE, AMYLASE in the last 168 hours. No results for input(s): AMMONIA in the last 168 hours. CBC: Recent Labs  Lab 07/11/21 1407 07/12/21 1129 07/12/21 1503 07/13/21 0404 07/14/21 0623  WBC 10.5 13.1* 15.8* 14.9* 10.1  NEUTROABS 6.9  --   --   --  8.8*  HGB 12.1 11.0* 11.5* 10.1* 10.5*  HCT 37.4 34.5* 36.3 31.3* 33.5*  MCV 108.1* 105.5* 106.8* 107.2* 110.9*  PLT 200 175 225 204 192   Cardiac Enzymes: No results for input(s): CKTOTAL, CKMB, CKMBINDEX, TROPONINI in the last 168 hours. BNP: Invalid input(s): POCBNP CBG: Recent Labs  Lab 07/14/21 1353 07/14/21 1637 07/14/21 2149 07/15/21 0718 07/15/21 1119  GLUCAP 164* 174* 111* 327* 277*   D-Dimer No results for input(s): DDIMER in the last 72 hours. Hgb A1c No results for input(s): HGBA1C in the last 72 hours. Lipid Profile No results for input(s): CHOL, HDL, LDLCALC, TRIG, CHOLHDL, LDLDIRECT in the last 72 hours. Thyroid function studies No results for input(s): TSH, T4TOTAL, T3FREE, THYROIDAB in the last 72 hours.  Invalid input(s): FREET3 Anemia work up No results for input(s): VITAMINB12, FOLATE, FERRITIN, TIBC, IRON, RETICCTPCT in the last 72 hours. Urinalysis    Component Value Date/Time   COLORURINE YELLOW 06/18/2020 1536   APPEARANCEUR CLOUDY (A) 06/18/2020 1536   LABSPEC 1.015 06/18/2020 1536   PHURINE 5.0 06/18/2020 1536   GLUCOSEU  150 (A) 06/18/2020 1536   HGBUR NEGATIVE 06/18/2020 [place] 06/18/2020 1536   KETONESUR NEGATIVE 06/18/2020 1536   PROTEINUR >=300 (A) 06/18/2020 1536   UROBILINOGEN 0.2 10/26/2013 2229   NITRITE NEGATIVE 06/18/2020 1536   LEUKOCYTESUR NEGATIVE 06/18/2020 1536   Sepsis Labs Invalid input(s): PROCALCITONIN,  WBC,  LACTICIDVEN Microbiology Recent Results (from the past 240 hour(s))  Resp Panel by RT-PCR (Flu A&B, Covid) Nasopharyngeal Swab     Status: None   Collection Time: 07/11/21  4:02 PM   Specimen: Nasopharyngeal Swab; Nasopharyngeal(NP) swabs in vial transport medium  Result Value Ref Range Status   SARS Coronavirus 2 by RT PCR NEGATIVE NEGATIVE Final    Comment: (NOTE) SARS-CoV-2 target nucleic acids are NOT DETECTED.  The SARS-CoV-2 RNA is generally detectable in upper respiratory specimens during the acute phase of infection. The lowest concentration of SARS-CoV-2 viral copies this assay can detect is 138 copies/mL. A negative result does not preclude SARS-Cov-2 infection and should not be used as the sole basis for treatment or other patient management decisions. A negative result may occur with  improper specimen collection/handling, submission of specimen other than nasopharyngeal swab, presence of viral mutation(s) within the areas targeted by this assay, and inadequate number of viral copies(<138  copies/mL). A negative result must be combined with clinical observations, patient history, and epidemiological information. The expected result is Negative.  Fact Sheet for Patients:  EntrepreneurPulse.com.au  Fact Sheet for Healthcare Providers:  IncredibleEmployment.be  This test is no t yet approved or cleared by the [place] FDA and  has been authorized for detection and/or diagnosis of SARS-CoV-2 by FDA under an Emergency Use Authorization (EUA). This EUA will remain  in effect (meaning this test can be  used) for the duration of the COVID-19 declaration under Section 564(b)(1) of the Act, 21 U.S.C.section 360bbb-3(b)(1), unless the authorization is terminated  or revoked sooner.       Influenza A by PCR NEGATIVE NEGATIVE Final   Influenza B by PCR NEGATIVE NEGATIVE Final    Comment: (NOTE) The Xpert Xpress SARS-CoV-2/FLU/RSV plus assay is intended as an aid in the diagnosis of influenza from Nasopharyngeal swab specimens and should not be used as a sole basis for treatment. Nasal washings and aspirates are unacceptable for Xpert Xpress SARS-CoV-2/FLU/RSV testing.  Fact Sheet for Patients: EntrepreneurPulse.com.au  Fact Sheet for Healthcare Providers: IncredibleEmployment.be  This test is not yet approved or cleared by the [place] FDA and has been authorized for detection and/or diagnosis of SARS-CoV-2 by FDA under an Emergency Use Authorization (EUA). This EUA will remain in effect (meaning this test can be used) for the duration of the COVID-19 declaration under Section 564(b)(1) of the Act, 21 U.S.C. section 360bbb-3(b)(1), unless the authorization is terminated or revoked.  Performed at [facility], [address], [place], [place] [address]   Respiratory (~20 pathogens) panel by PCR     Status: Abnormal   Collection Time: 07/13/21  1:20 AM   Specimen: Nasopharyngeal Swab; Respiratory  Result Value Ref Range Status   Adenovirus NOT DETECTED NOT DETECTED Final   Coronavirus 229E NOT DETECTED NOT DETECTED Final    Comment: (NOTE) The Coronavirus on the Respiratory Panel, DOES NOT test for the novel  Coronavirus (2019 nCoV)    Coronavirus HKU1 NOT DETECTED NOT DETECTED Final   Coronavirus NL63 NOT DETECTED NOT DETECTED Final   Coronavirus OC43 NOT DETECTED NOT DETECTED Final   Metapneumovirus NOT DETECTED NOT DETECTED Final   Rhinovirus / Enterovirus DETECTED (A) NOT DETECTED Final   Influenza A NOT DETECTED NOT DETECTED Final    Influenza B NOT DETECTED NOT DETECTED Final   Parainfluenza Virus 1 NOT DETECTED NOT DETECTED Final   Parainfluenza Virus 2 NOT DETECTED NOT DETECTED Final   Parainfluenza Virus 3 NOT DETECTED NOT DETECTED Final   Parainfluenza Virus 4 NOT DETECTED NOT DETECTED Final   Respiratory Syncytial Virus NOT DETECTED NOT DETECTED Final   Bordetella pertussis NOT DETECTED NOT DETECTED Final   Bordetella Parapertussis NOT DETECTED NOT DETECTED Final   Chlamydophila pneumoniae NOT DETECTED NOT DETECTED Final   Mycoplasma pneumoniae NOT DETECTED NOT DETECTED Final    Comment: Performed at [facility], [address], [place], [place] [address]     Time coordinating discharge: 5mins  SIGNED:   Kathie Dike, MD  [facility] 07/15/2021, 8:43 PM   If 7PM-7AM, please contact night-coverage [email or web address]

## 2021-07-15 NOTE — Telephone Encounter (Addendum)
Received a ref request from [facility] regarding pt needing hosp f/u appt with Dr. Melvyn Novas. No openings on schedule for [facility] or [place] office with Dr. Melvyn Novas until middle of December (12/14)   Dr. Melvyn Novas please advise.

## 2021-07-15 NOTE — Telephone Encounter (Signed)
Ok to use Friday the 4th  at 2pm, only open this one slot

## 2021-07-15 NOTE — Progress Notes (Signed)
[facility] ROUNDING NOTE   Subjective:   Interval History   This is a very nice lady with ESRD, she underwent successful dialysis with 2 L removed on 07/14/2021 Next dialysis will be 07/16/2021 she was admitted 10/25 with bronchitis. She was placed on antibiotics and is symptomatically better.  BP 100/53 pulse 78 temperature 97.8     Na 126  K 6.9   Co2 20   Cr 5.8  BUN 63 129 potassium 4.7 chloride 91 CO2 28 BUN 35 creatinine 3 glucose 350  Meds  allopurinol, asa, lipitor, plavix, insulin, protonix,   IV doxy and IV Azithromycin    Objective:  Vital signs in last 24 hours:  Temp:  [97.8 F (36.6 C)-98.2 F (36.8 C)] 97.8 F (36.6 C) (10/28 0552) Pulse Rate:  [74-90] 78 (10/28 0552) Resp:  [17-20] 19 (10/27 2145) BP: (83-150)/(42-74) 100/53 (10/28 0552) SpO2:  [94 %-100 %] 100 % (10/28 0822) Weight:  [98.7 kg] 98.7 kg (10/27 1140)  Weight change: 2.9 kg Filed Weights   07/13/21 0803 07/14/21 1140  Weight: 95.8 kg 98.7 kg    Intake/Output: I/O last 3 completed shifts: In: 845 [P.O.:720; IV Piggyback:125] Out: 2077 [Other:2077]   Intake/Output this shift:  No intake/output data recorded.  Gen:NAD CVS:RRR Resp: bilateral exp wheezes Abd:+BS, soft, NT/ND Ext: trace pretibial edema, RUE AVF +T/B   Basic Metabolic Panel: Recent Labs  Lab 07/11/21 1407 07/12/21 1129 07/12/21 1503 07/13/21 0007 07/13/21 0404 07/14/21 3762 07/14/21 0836 07/14/21 1215 07/15/21 0538  NA 135   < > 133*  133*   < > 129* 127* 126* 125* 129*  K 4.4   < > 4.8  4.7   < > 4.4 6.4* 6.9* 4.7 4.7  CL 97*   < > 94*  94*   < > 91* 93* 92* 92* 91*  CO2 27   < > 24  24   < > 26 23 20* 22 28  GLUCOSE 134*   < > 369*  368*   < > 491* 417* 507* 437* 350*  BUN 47*   < > 66*  67*   < > 30* 57* 63* 66* 35*  CREATININE 6.38*   < > 7.28*  7.26*   < > 4.02* 5.40* 5.80* 6.02* 3.18*  CALCIUM 9.4   < > 9.4  9.3   < > 8.6* 8.7* 9.0 8.8* 8.5*  MG 2.3  --   --   --   --   --   --    --  2.1  PHOS  --   --  6.9*  6.7*  --   --   --   --  5.6*  --    < > = values in this interval not displayed.     Liver Function Tests: Recent Labs  Lab 07/11/21 1407 07/12/21 1503 07/13/21 0404 07/14/21 1215  AST 17  --  26  --   ALT 15  --  17  --   ALKPHOS 81  --  70  --   BILITOT 0.6  --  0.5  --   PROT 7.0  --  6.5  --   ALBUMIN 3.3* 3.3*  3.4* 3.0* 3.1*    No results for input(s): LIPASE, AMYLASE in the last 168 hours. No results for input(s): AMMONIA in the last 168 hours.  CBC: Recent Labs  Lab 07/11/21 1407 07/12/21 1129 07/12/21 1503 07/13/21 0404 07/14/21 0623  WBC 10.5 13.1* 15.8* 14.9* 10.1  NEUTROABS 6.9  --   --   --  8.8*  HGB 12.1 11.0* 11.5* 10.1* 10.5*  HCT 37.4 34.5* 36.3 31.3* 33.5*  MCV 108.1* 105.5* 106.8* 107.2* 110.9*  PLT 200 175 225 204 192     Cardiac Enzymes: No results for input(s): CKTOTAL, CKMB, CKMBINDEX, TROPONINI in the last 168 hours.  BNP: Invalid input(s): POCBNP  CBG: Recent Labs  Lab 07/14/21 1053 07/14/21 1353 07/14/21 1637 07/14/21 2149 07/15/21 0718  GLUCAP 462* 164* 174* 111* 327*     Microbiology: Results for orders placed or performed during the hospital encounter of 07/12/21  Respiratory (~20 pathogens) panel by PCR     Status: Abnormal   Collection Time: 07/13/21  1:20 AM   Specimen: Nasopharyngeal Swab; Respiratory  Result Value Ref Range Status   Adenovirus NOT DETECTED NOT DETECTED Final   Coronavirus 229E NOT DETECTED NOT DETECTED Final    Comment: (NOTE) The Coronavirus on the Respiratory Panel, DOES NOT test for the novel  Coronavirus (2019 nCoV)    Coronavirus HKU1 NOT DETECTED NOT DETECTED Final   Coronavirus NL63 NOT DETECTED NOT DETECTED Final   Coronavirus OC43 NOT DETECTED NOT DETECTED Final   Metapneumovirus NOT DETECTED NOT DETECTED Final   Rhinovirus / Enterovirus DETECTED (A) NOT DETECTED Final   Influenza A NOT DETECTED NOT DETECTED Final   Influenza B NOT DETECTED NOT  DETECTED Final   Parainfluenza Virus 1 NOT DETECTED NOT DETECTED Final   Parainfluenza Virus 2 NOT DETECTED NOT DETECTED Final   Parainfluenza Virus 3 NOT DETECTED NOT DETECTED Final   Parainfluenza Virus 4 NOT DETECTED NOT DETECTED Final   Respiratory Syncytial Virus NOT DETECTED NOT DETECTED Final   Bordetella pertussis NOT DETECTED NOT DETECTED Final   Bordetella Parapertussis NOT DETECTED NOT DETECTED Final   Chlamydophila pneumoniae NOT DETECTED NOT DETECTED Final   Mycoplasma pneumoniae NOT DETECTED NOT DETECTED Final    Comment: Performed at [facility], [place] [address] [place], [place] [address]    Coagulation Studies: No results for input(s): LABPROT, INR in the last 72 hours.  Urinalysis: No results for input(s): COLORURINE, LABSPEC, PHURINE, GLUCOSEU, HGBUR, BILIRUBINUR, KETONESUR, PROTEINUR, UROBILINOGEN, NITRITE, LEUKOCYTESUR in the last 72 hours.  Invalid input(s): APPERANCEUR    Imaging: CT CHEST WO CONTRAST  Result Date: 07/14/2021 CLINICAL DATA:  Persistent cough. EXAM: CT CHEST WITHOUT CONTRAST TECHNIQUE: Multidetector CT imaging of the chest was performed following the standard protocol without IV contrast. COMPARISON:  Chest CT dated 04/20/2021. FINDINGS: Evaluation of this exam is limited in the absence of intravenous contrast. Cardiovascular: There is no cardiomegaly or pericardial effusion. There is coronary vascular calcification and postsurgical changes of CABG. Right sided dialysis catheter with tip at the cavoatrial junction. Moderate atherosclerotic calcification of the thoracic aorta. No aneurysmal dilatation. The central pulmonary arteries are grossly unremarkable. Mediastinum/Nodes: No hilar or mediastinal adenopathy. The esophagus is grossly unremarkable. No mediastinal fluid collection. Lungs/Pleura: Scattered clusters of ground-glass nodular densities in the right lung most consistent with pneumonia, likely atypical in etiology. Clinical correlation  and follow-up recommended. There is a 5 mm right lower lobe subpleural nodule (84/4). The left lung is clear. No pleural effusion pneumothorax. The central airways are patent. Upper Abdomen: Cholecystectomy. Musculoskeletal: Median sternotomy wires. No acute osseous pathology. IMPRESSION: 1. Scattered clusters of ground-glass nodular densities in the right lung most consistent with pneumonia, likely atypical in etiology. 2. There is a 5 mm right lower lobe subpleural nodule.No follow-up needed if patient is low-risk (  and has no known or suspected primary neoplasm). Non-contrast chest CT can be considered in 12 months if patient is high-risk. This recommendation follows the consensus statement: Guidelines for Management of Incidental Pulmonary Nodules Detected on CT Images: From the Fleischner Society 2017; Radiology 2017; 284:228-243. 3. Aortic Atherosclerosis (ICD10-I70.0). Electronically Signed   By: Anner Crete M.D.   On: 07/14/2021 19:53     Medications:    sodium chloride     sodium chloride     azithromycin 250 mg (07/14/21 2226)    acetaminophen  650 mg Oral Once   allopurinol  100 mg Oral Q T,Th,Sa-HD   aspirin EC  81 mg Oral Daily   atorvastatin  80 mg Oral QPM   Chlorhexidine Gluconate Cloth  6 each Topical Q0600   clopidogrel  75 mg Oral Daily   ezetimibe  10 mg Oral Daily   ferric citrate  420 mg Oral TID WC   gabapentin  400 mg Oral QHS   guaiFENesin  600 mg Oral BID   heparin injection (subcutaneous)  5,000 Units Subcutaneous Q8H   insulin aspart  0-15 Units Subcutaneous TID WC   insulin aspart  0-5 Units Subcutaneous QHS   insulin aspart  8 Units Subcutaneous TID WC   insulin aspart protamine- aspart  30 Units Subcutaneous BID WC   ipratropium-albuterol  3 mL Nebulization Q6H   ipratropium-albuterol  3 mL Nebulization Once   levothyroxine  112 mcg Oral QAC breakfast   methylPREDNISolone (SOLU-MEDROL) injection  40 mg Intravenous Q12H   midodrine  10 mg Oral BID WC    multivitamin  1 tablet Oral Daily   pantoprazole  40 mg Oral BID   ranolazine  500 mg Oral QHS   sodium chloride, sodium chloride, albuterol, ALPRAZolam, alteplase, chlorpheniramine-HYDROcodone, cyclobenzaprine, heparin, heparin, heparin, lidocaine (PF), lidocaine-prilocaine, Muscle Rub, nitroGLYCERIN, ondansetron, oxyCODONE-acetaminophen, pentafluoroprop-tetrafluoroeth, senna-docusate  Assessment/ Plan:  Dialysis Orders: Center: [facility]  on TTS . EDW 94.5kg HD Bath 2K/2.5Ca  Time 4:15 Heparin 2000 units. Access RIJ TDC, maturing RUE AVF BFR 400 DFR 800    Micera 50 mcg IVP every 4 weeks  Assessment/Plan:  Bronchitis - pt presented with shortness of breath with cough and congestion - respiratory panel negative.  Failed outpatient therapy.  Plan primary svc  ESRD -  continue with HD on TTS schedule next dialysis on 07/15/2021  Hypertension/volume  - UF as tolerated  Anemia  - stable, no ESA  Metabolic bone disease -  resume home meds  Nutrition - renal diet, carb modified CAD s/p recent NSTEMI and PCI- stable at present.  Vascular access - maturing RUE AVF, wrist band in place    LOS: [place] @TODAY @8 :25 AM

## 2021-07-16 DIAGNOSIS — N2581 Secondary hyperparathyroidism of renal origin: Secondary | ICD-10-CM | POA: Diagnosis not present

## 2021-07-16 DIAGNOSIS — E1122 Type 2 diabetes mellitus with diabetic chronic kidney disease: Secondary | ICD-10-CM | POA: Diagnosis not present

## 2021-07-16 DIAGNOSIS — D689 Coagulation defect, unspecified: Secondary | ICD-10-CM | POA: Diagnosis not present

## 2021-07-16 DIAGNOSIS — N186 End stage renal disease: Secondary | ICD-10-CM | POA: Diagnosis not present

## 2021-07-16 DIAGNOSIS — Z992 Dependence on renal dialysis: Secondary | ICD-10-CM | POA: Diagnosis not present

## 2021-07-17 DIAGNOSIS — E1140 Type 2 diabetes mellitus with diabetic neuropathy, unspecified: Secondary | ICD-10-CM | POA: Diagnosis not present

## 2021-07-17 DIAGNOSIS — I10 Essential (primary) hypertension: Secondary | ICD-10-CM | POA: Diagnosis not present

## 2021-07-17 DIAGNOSIS — Z955 Presence of coronary angioplasty implant and graft: Secondary | ICD-10-CM | POA: Diagnosis not present

## 2021-07-17 DIAGNOSIS — N189 Chronic kidney disease, unspecified: Secondary | ICD-10-CM | POA: Diagnosis not present

## 2021-07-17 DIAGNOSIS — I209 Angina pectoris, unspecified: Secondary | ICD-10-CM | POA: Diagnosis not present

## 2021-07-17 DIAGNOSIS — Z951 Presence of aortocoronary bypass graft: Secondary | ICD-10-CM | POA: Diagnosis not present

## 2021-07-17 DIAGNOSIS — Z8679 Personal history of other diseases of the circulatory system: Secondary | ICD-10-CM | POA: Diagnosis not present

## 2021-07-17 DIAGNOSIS — J449 Chronic obstructive pulmonary disease, unspecified: Secondary | ICD-10-CM | POA: Diagnosis not present

## 2021-07-18 DIAGNOSIS — Z87891 Personal history of nicotine dependence: Secondary | ICD-10-CM | POA: Diagnosis not present

## 2021-07-18 DIAGNOSIS — I70201 Unspecified atherosclerosis of native arteries of extremities, right leg: Secondary | ICD-10-CM | POA: Diagnosis not present

## 2021-07-18 DIAGNOSIS — I10 Essential (primary) hypertension: Secondary | ICD-10-CM | POA: Diagnosis not present

## 2021-07-18 DIAGNOSIS — E1165 Type 2 diabetes mellitus with hyperglycemia: Secondary | ICD-10-CM | POA: Diagnosis not present

## 2021-07-18 DIAGNOSIS — Z794 Long term (current) use of insulin: Secondary | ICD-10-CM | POA: Diagnosis not present

## 2021-07-19 DIAGNOSIS — D689 Coagulation defect, unspecified: Secondary | ICD-10-CM | POA: Diagnosis not present

## 2021-07-19 DIAGNOSIS — N2581 Secondary hyperparathyroidism of renal origin: Secondary | ICD-10-CM | POA: Diagnosis not present

## 2021-07-19 DIAGNOSIS — N186 End stage renal disease: Secondary | ICD-10-CM | POA: Diagnosis not present

## 2021-07-19 DIAGNOSIS — Z992 Dependence on renal dialysis: Secondary | ICD-10-CM | POA: Diagnosis not present

## 2021-07-21 DIAGNOSIS — J9600 Acute respiratory failure, unspecified whether with hypoxia or hypercapnia: Secondary | ICD-10-CM | POA: Diagnosis not present

## 2021-07-21 DIAGNOSIS — J449 Chronic obstructive pulmonary disease, unspecified: Secondary | ICD-10-CM | POA: Diagnosis not present

## 2021-07-21 DIAGNOSIS — N186 End stage renal disease: Secondary | ICD-10-CM | POA: Diagnosis not present

## 2021-07-21 DIAGNOSIS — N2581 Secondary hyperparathyroidism of renal origin: Secondary | ICD-10-CM | POA: Diagnosis not present

## 2021-07-21 DIAGNOSIS — Z992 Dependence on renal dialysis: Secondary | ICD-10-CM | POA: Diagnosis not present

## 2021-07-21 DIAGNOSIS — Z794 Long term (current) use of insulin: Secondary | ICD-10-CM | POA: Diagnosis not present

## 2021-07-21 DIAGNOSIS — D689 Coagulation defect, unspecified: Secondary | ICD-10-CM | POA: Diagnosis not present

## 2021-07-22 ENCOUNTER — Ambulatory Visit (INDEPENDENT_AMBULATORY_CARE_PROVIDER_SITE_OTHER): Payer: Medicare HMO | Admitting: Internal Medicine

## 2021-07-22 ENCOUNTER — Encounter: Payer: Self-pay | Admitting: Internal Medicine

## 2021-07-22 ENCOUNTER — Other Ambulatory Visit: Payer: Self-pay

## 2021-07-22 DIAGNOSIS — N185 Chronic kidney disease, stage 5: Secondary | ICD-10-CM | POA: Diagnosis not present

## 2021-07-22 DIAGNOSIS — E039 Hypothyroidism, unspecified: Secondary | ICD-10-CM | POA: Diagnosis not present

## 2021-07-22 DIAGNOSIS — I10 Essential (primary) hypertension: Secondary | ICD-10-CM | POA: Diagnosis not present

## 2021-07-22 DIAGNOSIS — E7849 Other hyperlipidemia: Secondary | ICD-10-CM | POA: Diagnosis not present

## 2021-07-22 DIAGNOSIS — J189 Pneumonia, unspecified organism: Secondary | ICD-10-CM

## 2021-07-22 DIAGNOSIS — J45991 Cough variant asthma: Secondary | ICD-10-CM | POA: Diagnosis not present

## 2021-07-22 DIAGNOSIS — E1140 Type 2 diabetes mellitus with diabetic neuropathy, unspecified: Secondary | ICD-10-CM | POA: Diagnosis not present

## 2021-07-22 DIAGNOSIS — E782 Mixed hyperlipidemia: Secondary | ICD-10-CM | POA: Diagnosis not present

## 2021-07-22 MED ORDER — BUDESONIDE-FORMOTEROL FUMARATE 80-4.5 MCG/ACT IN AERO: INHALATION_SPRAY | RESPIRATORY_TRACT | 11 refills | Status: AC

## 2021-07-22 NOTE — Assessment & Plan Note (Addendum)
Onset  2014 worse since Jan 2021  - increase gabapentin to 400 mg qid 11/20/2019 and add pepcid 20 mg bid - 01/13/2020 still coughing on 400 mg bid - 01/13/2020  After extensive coaching inhaler device,  effectiveness =    50% from a baseline of nearly 0  - off symbicort x one week > PFT's  01/26/20  FEV1 1.58 (59 % ) ratio 0.71  p 9 % improvement from saba p 0 prior to study with DLCO  14.08 (66%) corrects to 3.57 (85%)  for alv volume and FV curve very minimal concavity  Her baseline lung function is minimally abn and does not fit criteria for copd so better defined as AB requiring intermitternt symbicort Based on two studies from NEJM  378; 20 p 1865 (2018) and 380 : p2020-30 (2019) in pts with mild asthma it is reasonable to use low dose symbicort eg 80 2bid "prn" flare in this setting but I emphasized this was only shown with symbicort and takes advantage of the rapid onset of action but is not the same as "rescue therapy" but can be stopped once the acute symptoms have resolved and the need for rescue has been minimized (< 2 x weekly)    - The proper method of use, as well as anticipated side effects, of a metered-dose inhaler were discussed and demonstrated to the patient using teach back method.   If not doing better on symbicort 80 dosed Take 2 puffs first thing in am and then another 2 puffs about 12 hours later needs to return to clinic with all inhalers in hand to regroup.           Each maintenance medication was reviewed in detail including emphasizing most importantly the difference between maintenance and prns and under what circumstances the prns are to be triggered using an action plan format where appropriate.  Total time for H and P, chart review, counseling, reviewing hfa device(s) and generating customized AVS unique to this office visit / same day charting  > 30 min

## 2021-07-22 NOTE — Progress Notes (Signed)
Donna Howe, female    DOB: April 09, 1956,    MRN: [id number]   Brief patient profile:  [id number] quit smoking 2014 with "bronchitis" = wheezing/ cough/ sob sev times a year since her 77s seems to come on early in winter and goes away with usually   both pred/ abx and sometimes saba  but in Jan 2021 treated the same as before and got better x for cough / did not respond to symbicort then admitted [facility] for cp  Admit date: 10/01/2019 Discharge date: 10/03/19  Discharge Diagnoses:      Active Hospital Problems   Diagnosis Date Noted   Chest pain 10/01/2019   Elevated troponin 10/02/2019   Hypothyroidism 04/18/2018   AKI (acute kidney injury) ([place]) 10/26/2013   Diabetes mellitus with renal manifestation ([place]) 01/21/2013   Chronic kidney disease (CKD), stage III (moderate) (HCC)     Asthma 08/26/2009   Obesity (BMI 30-39.9) 08/26/2009   CAD (coronary artery disease) 03/31/2007   Essential hypertension 03/31/2007          History of present illness:  65 year old female with past medical history of obesity, CAD status post CABG 15 years ago with multiple PCI's since then possible non-STEMI in July 2019 as well as stage III chronic kidney disease and diabetes mellitus who presented with chest pain.  In the emergency room, found to have mildly elevated troponin and states that she has been taking her aspirin and Plavix.  She states that time she has missed doses of hydralazine.  Also on admission, patient's creatinine elevated from baseline around 2.2 up to 3.02.  Following admission, repeat troponins much more elevated, going from 16 on admission up to 104 within 8 hours   Hospital Course:  Principal Problem:   Chest pain in the setting of elevated troponins and extensive CAD history: Seen by cardiology.  With her elevated creatinine, limited options for cath.    Echocardiogram unrevealing.  By 1/15, creatinine down to 2.67.  Her troponin had actually also trended down to 66.  In discussion  with cardiology, they felt that elevated troponin may be more likely related to her respiratory bronchitis I did not feel that she would need cardiac intervention.  They recommended continuing her home medications    Bronchitis: Likely cause of patient's cough and chest pain.  Her lung exam is clear and she has no fever.  Normal white count.  Would recommend she complete the course of antibiotics and steroids that were started prior to her hospitalization though.   active Problems:   Obesity (BMI 30-39.9): Meets criteria for BMI greater than 30.   Malignant hypertension: It is possible some of this could be secondary to fluid retention.    Patient's blood pressure much improved by increasing her Imdur and Norvasc.  Stressed the importance of compliance with her hydralazine which she says she will do.       AKI in the setting of chronic kidney disease (CKD), stage III (moderate) ([place]): See above.  By following day, down to 2.8.  Much improved, closer to baseline by day of discharge.  At 2.67 this morning and patient continued to get IV fluids until time of discharge.  When she follows with her PCP, repeat basic metabolic panel done at that time.     Diabetes mellitus with renal manifestation (HCC)/stage III chronic kidney disease: A1c notes good control at 6.8     Hypothyroidism: Continue Synthroid       History of Present Illness  11/20/2019  Pulmonary/ 1st office eval/Donna Howe  Chief Complaint  Patient presents with   Pulmonary Consult    Referred by Dr. Rory Percy. Pt c/o cough since Jan 2021. She states has hx of having bronchitis. Her cough is occ prod with white sputum.   Dyspnea:  MMRC3 = can't walk 100 yards even at a slow pace at a flat grade s stopping due to sob and leg pain  Cough: sporadic / variably productive  Day > noct Sleep: on side/ bed is flat, 3 pillows SABA no better / also no better on prednisone per Dr Lyman Speller note/ ? Some better on symbicort 160 Did not take ppi  due to renal concerns rec Increase gabapentin to 400 mg four times a day until you stop coughing  Pepcid 20 mg after bfast and supper  GERD diet Please schedule a follow up office visit in 4 weeks, sooner if needed          01/13/2020  f/u ov/Donna Howe re: cough x 2015  sporadic dry worse in jan 2021  Chief Complaint  Patient presents with   Follow-up    Cough is unchanged since the last visit. No new co's. She is using her albuterol inhaler 3 x per wk on average.   Dyspnea:  MMRC1 = can walk nl pace, flat grade, can't hurry or go uphills or steps s sob   Cough: not coughing daily unless wearing mask - says cough waxes and wanes x years worse in winter Sleeping: on side /bed is flat 3pillows SABA use: says helps some though technique is very poor  02: none  Rec Work on inhaler technique:   If you start feeling like you need more albuterol, I'd like to start you back on symbicort 80 Take 2 puffs first thing in am and then another 2 puffs about 12 hours later.  Keep your appt for your PFT as planned    HD Fall 2021    Admit date: 07/12/2021 Discharge date: 07/15/2021  Home Health: Equipment/Devices: Nebulizer machine      Brief/Interim Summary: 65 year old female with history of end-stage renal disease, coronary artery disease, history of possible asthma/COPD, admitted with wheezing/bronchoconstriction likely precipitated by acute bronchitis.   Discharge Diagnoses:  Active Problems:   Asthma, chronic, unspecified asthma severity, with acute exacerbation   ESRD (end stage renal disease) (HCC)   Acute respiratory failure (HCC)   COPD (chronic obstructive pulmonary disease) (HCC)   Insulin dependent type 2 diabetes mellitus ([place])   Acute exacerbation of asthma/COPD, likely precipitated by bronchitis -Was initially seen in the emergency room on 10/24 and was sent home with antibiotic and steroids, unfortunately her symptoms persisted, wheezing got worse and she had to be  admitted -Found to be negative for COVID and pCXR nl  -Admitted for IV steroids, bronchodilators, antibiotics -CT chest 07/14/21  Scattered clusters of ground-glass nodular densities in the right lung most consistent with pneumonia, likely atypical in etiology -With steroids and bronchodilators, overall respiratory status has improved and wheezing is now resolved -She reports using only albuterol on an as-needed basis -?  If she would benefit from steroid inhaler -She was previously seen pulmonology, but her last visit appears to be in 2021 -We will request follow-up with her primary pulmonologist to see if steroid inhaler would be helpful   CAD status post CABG -No chest pain at this time -Continue aspirin, Plavix and Ranexa   ESRD -Continued on dialysis per schedule   Diabetes -Hyperglycemia related to steroids,  anticipate this should improve as steroids are tapered -Insulin has been adjusted   Chronic hypotension -Continue on midodrine     07/22/2021  post hosp f/u ov/[place] office/Donna Howe re: AB  not maint on symb Chief Complaint  Patient presents with   Hospitalization Follow-up    Hosp. F/u 10/25-10/28 asthma exacerbation     Dyspnea:  more limited by legs than sob  Cough: not now  Sleeping: bed is flat, couple of pillows under head, no resp cc   SABA use: rarely  02:  none  Covid status: vax x one      No obvious day to day or daytime variability or assoc excess/ purulent sputum or mucus plugs or hemoptysis or cp or chest tightness, subjective wheeze or overt sinus or hb symptoms.   Sleeping  without nocturnal  or early am exacerbation  of respiratory  c/o's or need for noct saba. Also denies any obvious fluctuation of symptoms with weather or environmental changes or other aggravating or alleviating factors except as outlined above   No unusual exposure hx or h/o childhood pna/ asthma or knowledge of premature birth.  Current Allergies, Complete Past Medical  History, Past Surgical History, Family History, and Social History were reviewed in [employer] record.  ROS  The following are not active complaints unless bolded Hoarseness, sore throat, dysphagia, dental problems, itching, sneezing,  nasal congestion or discharge of excess mucus or purulent secretions, ear ache,   fever, chills, sweats, unintended wt loss or wt gain, classically pleuritic or exertional cp,  orthopnea pnd or arm/hand swelling  or leg swelling, presyncope, palpitations, abdominal pain, anorexia, nausea, vomiting, diarrhea  or change in bowel habits or change in bladder habits, change in stools or change in urine, dysuria, hematuria,  rash, arthralgias, visual complaints, headache, numbness, weakness or ataxia or problems with walking or coordination,  change in mood or  memory.        Current Meds  Medication Sig   albuterol (PROVENTIL) (2.5 MG/3ML) 0.083% nebulizer solution Take 3 mLs (2.5 mg total) by nebulization every 2 (two) hours as needed for wheezing.   Albuterol Sulfate 108 (90 Base) MCG/ACT AEPB Inhale 2 puffs into the lungs every 6 (six) hours as needed (Shortness of breath).   allopurinol (ZYLOPRIM) 100 MG tablet Take one tablet after hemodialysis, on Tuesday, Thursday and Saturday. (Patient taking differently: Take 100 mg by mouth Every Tuesday,Thursday,and Saturday with dialysis. After dialysis)   ALPRAZolam (XANAX) 0.5 MG tablet Take 0.25-0.5 mg by mouth See admin instructions. Take 0.25 in the morning and evening and 0.5 mg at bedtime   aspirin EC 81 MG tablet Take 81 mg by mouth daily.    atorvastatin (LIPITOR) 80 MG tablet Take 1 tablet (80 mg total) by mouth every evening. (Patient taking differently: Take 80 mg by mouth at bedtime.)   clopidogrel (PLAVIX) 75 MG tablet Take 1 tablet (75 mg total) by mouth daily.   cyclobenzaprine (FLEXERIL) 5 MG tablet Take 1 tablet (5 mg total) by mouth 2 (two) times daily with a meal.   diclofenac Sodium  (VOLTAREN) 1 % GEL Apply topically.   ezetimibe (ZETIA) 10 MG tablet Take 1 tablet (10 mg total) by mouth daily. (Patient taking differently: Take 10 mg by mouth at bedtime.)   ferric citrate (AURYXIA) 1 GM 210 MG(Fe) tablet Take 420 mg by mouth 3 (three) times daily with meals.   gabapentin (NEURONTIN) 400 MG capsule Take 1 capsule (400 mg total) by mouth at  bedtime.   ipratropium (ATROVENT HFA) 17 MCG/ACT inhaler Inhale 2 puffs into the lungs every 4 (four) hours as needed for wheezing.    levothyroxine (SYNTHROID) 112 MCG tablet Take 112 mcg by mouth daily before breakfast.   midodrine (PROAMATINE) 10 MG tablet Take 1 tablet (10 mg total) by mouth 2 (two) times daily with a meal.   multivitamin (RENA-VIT) TABS tablet Take 1 tablet by mouth daily.   nitroGLYCERIN (NITROSTAT) 0.4 MG SL tablet Place 1 tablet (0.4 mg total) under the tongue every 5 (five) minutes x 3 doses as needed for chest pain.   NOVOLIN 70/30 KWIKPEN (70-30) 100 UNIT/ML KwikPen Inject 35 Units into the skin in the morning and at bedtime.   ondansetron (ZOFRAN) 8 MG tablet Take 8 mg by mouth every 8 (eight) hours as needed for nausea.   ONETOUCH ULTRA test strip Check blood glucose THREE TIMES DAILY   pantoprazole (PROTONIX) 40 MG tablet Take 1 tablet (40 mg total) by mouth 2 (two) times daily.   ranolazine (RANEXA) 500 MG 12 hr tablet Take 1 tablet (500 mg total) by mouth at bedtime.   Vitamin D, Ergocalciferol, (DRISDOL) 1.25 MG (50000 UNIT) CAPS capsule Take 50,000 Units by mouth every Sunday.             Past Medical History:  Diagnosis Date   Anemia    Asthma    CAD (coronary artery disease)    Multivessel s/p CABG 2005, numerous PCIs since that time   Carotid artery disease (HCC)    R CEA   Chronic kidney disease (CKD), stage III (moderate)    Essential hypertension    Gout    Hyperlipidemia    Hypothyroidism    Myocardial infarction (HCC)    PAD (peripheral artery disease) (HCC)    Dr. Lawson   S/P  angioplasty with stent- DES to mRCA and to LIMA to LAD with DES 04/09/18.   04/10/2018   SBO (small bowel obstruction) (HCC) 2011   Status post lysis of adhesions & hernia repair   Sinus bradycardia    Thrombocytopenia, unspecified (HCC)    Type 2 diabetes mellitus (HCC)    Umbilical hernia        Objective:        07/22/2021       21 4    01/13/20 223 lb (101.2 kg)  01/05/20 222 lb (100.7 kg)  12/22/19 221 lb 4.8 oz (100.4 kg)     Vital signs reviewed  07/22/2021  - Note at rest 02 sats  100% on RA   General appearance:    obese wf nad     HEENT : pt wearing mask not removed for exam due to covid -19 concerns.    NECK :  without JVD/Nodes/TM/ nl carotid upstrokes bilaterally   LUNGS: no acc muscle use,  Nl contour chest which is clear to A and P bilaterally without cough on insp or exp maneuvers   CV:  RRR  no s3 or murmur or increase in P2, and no edema   ABD:  obese soft and nontender with nl inspiratory excursion in the supine position. No bruits or organomegaly appreciated, bowel sounds nl  MS:  Nl gait/ ext warm without deformities, calf tenderness, cyanosis or clubbing No obvious joint restrictions   SKIN: warm and dry without lesions    NEURO:  alert, approp, nl sensorium with  no motor or cerebellar deficits apparent.         I personally reviewed  images and agree with radiology impression as follows:  CXR:   07/12/20 portable Stable chest with no radiographic evidence of acute cardiopulmonary process.       Assessment

## 2021-07-22 NOTE — Assessment & Plan Note (Addendum)
See admit 07/12/21 dx was rhinovirus - 07/22/2021  No further w/u needed unless symptoms relapse

## 2021-07-22 NOTE — Patient Instructions (Signed)
Work on inhaler technique:  relax and gently blow all the way out then take a nice smooth deep breath back in, triggering the inhaler at same time you start breathing in.  Hold for up to 5 seconds if you can.  . Rinse and gargle with water when done  If you start feeling like you need more albuterol, I'd like to start you back on symbicort 80 Take 2 puffs first thing in am and then another 2 puffs about 12 hours later.    Plan B = Backup (to supplement plan A, not to replace it) Only use your albuterol inhaler as a rescue medication to be used if you can't catch your breath by resting or doing a relaxed purse lip breathing pattern.  - The less you use it, the better it will work when you need it. - Ok to use the inhaler up to 2 puffs  every 4 hours if you must but call for appointment if use goes up over your usual need - Don't leave home without it !!  (think of it like the spare tire for your car)   Plan C = Crisis (instead of Plan B but only if Plan B stops working) - only use your albuterol nebulizer if you first try Plan B and it fails to help > ok to use the nebulizer up to every 4 hours but if start needing it regularly call for immediate appointment    If you are satisfied with your treatment plan,  let your doctor know and he/she can either refill your medications or you can return here when your prescription runs out.     If in any way you are not 100% satisfied,  please tell us.  If 100% better, tell your friends!  Pulmonary follow up is as needed

## 2021-07-23 DIAGNOSIS — N2581 Secondary hyperparathyroidism of renal origin: Secondary | ICD-10-CM | POA: Diagnosis not present

## 2021-07-23 DIAGNOSIS — N186 End stage renal disease: Secondary | ICD-10-CM | POA: Diagnosis not present

## 2021-07-23 DIAGNOSIS — D689 Coagulation defect, unspecified: Secondary | ICD-10-CM | POA: Diagnosis not present

## 2021-07-23 DIAGNOSIS — Z992 Dependence on renal dialysis: Secondary | ICD-10-CM | POA: Diagnosis not present

## 2021-07-25 ENCOUNTER — Other Ambulatory Visit (HOSPITAL_COMMUNITY): Payer: Self-pay | Admitting: Family Medicine

## 2021-07-25 ENCOUNTER — Other Ambulatory Visit: Payer: Self-pay

## 2021-07-25 ENCOUNTER — Ambulatory Visit (HOSPITAL_COMMUNITY)
Admission: RE | Admit: 2021-07-25 | Discharge: 2021-07-25 | Disposition: A | Discharge status: Home / Self Care | Payer: Medicare HMO | Source: Ambulatory Visit | Attending: Family Medicine | Admitting: Family Medicine

## 2021-07-25 DIAGNOSIS — M7989 Other specified soft tissue disorders: Secondary | ICD-10-CM | POA: Diagnosis not present

## 2021-07-25 DIAGNOSIS — R600 Localized edema: Secondary | ICD-10-CM | POA: Diagnosis not present

## 2021-07-26 DIAGNOSIS — N186 End stage renal disease: Secondary | ICD-10-CM | POA: Diagnosis not present

## 2021-07-26 DIAGNOSIS — N2581 Secondary hyperparathyroidism of renal origin: Secondary | ICD-10-CM | POA: Diagnosis not present

## 2021-07-26 DIAGNOSIS — D689 Coagulation defect, unspecified: Secondary | ICD-10-CM | POA: Diagnosis not present

## 2021-07-26 DIAGNOSIS — D631 Anemia in chronic kidney disease: Secondary | ICD-10-CM | POA: Diagnosis not present

## 2021-07-26 DIAGNOSIS — Z992 Dependence on renal dialysis: Secondary | ICD-10-CM | POA: Diagnosis not present

## 2021-07-28 DIAGNOSIS — D631 Anemia in chronic kidney disease: Secondary | ICD-10-CM | POA: Diagnosis not present

## 2021-07-28 DIAGNOSIS — N2581 Secondary hyperparathyroidism of renal origin: Secondary | ICD-10-CM | POA: Diagnosis not present

## 2021-07-28 DIAGNOSIS — Z992 Dependence on renal dialysis: Secondary | ICD-10-CM | POA: Diagnosis not present

## 2021-07-28 DIAGNOSIS — N186 End stage renal disease: Secondary | ICD-10-CM | POA: Diagnosis not present

## 2021-07-28 DIAGNOSIS — D689 Coagulation defect, unspecified: Secondary | ICD-10-CM | POA: Diagnosis not present

## 2021-07-30 DIAGNOSIS — D689 Coagulation defect, unspecified: Secondary | ICD-10-CM | POA: Diagnosis not present

## 2021-07-30 DIAGNOSIS — N2581 Secondary hyperparathyroidism of renal origin: Secondary | ICD-10-CM | POA: Diagnosis not present

## 2021-07-30 DIAGNOSIS — N186 End stage renal disease: Secondary | ICD-10-CM | POA: Diagnosis not present

## 2021-07-30 DIAGNOSIS — Z992 Dependence on renal dialysis: Secondary | ICD-10-CM | POA: Diagnosis not present

## 2021-07-30 DIAGNOSIS — D631 Anemia in chronic kidney disease: Secondary | ICD-10-CM | POA: Diagnosis not present

## 2021-08-02 DIAGNOSIS — E1122 Type 2 diabetes mellitus with diabetic chronic kidney disease: Secondary | ICD-10-CM | POA: Diagnosis not present

## 2021-08-02 DIAGNOSIS — N186 End stage renal disease: Secondary | ICD-10-CM | POA: Diagnosis not present

## 2021-08-02 DIAGNOSIS — Z992 Dependence on renal dialysis: Secondary | ICD-10-CM | POA: Diagnosis not present

## 2021-08-02 DIAGNOSIS — N2581 Secondary hyperparathyroidism of renal origin: Secondary | ICD-10-CM | POA: Diagnosis not present

## 2021-08-02 DIAGNOSIS — Z1152 Encounter for screening for COVID-19: Secondary | ICD-10-CM | POA: Diagnosis not present

## 2021-08-02 DIAGNOSIS — D689 Coagulation defect, unspecified: Secondary | ICD-10-CM | POA: Diagnosis not present

## 2021-08-03 ENCOUNTER — Other Ambulatory Visit: Payer: Self-pay

## 2021-08-03 ENCOUNTER — Ambulatory Visit (HOSPITAL_COMMUNITY)
Admission: RE | Admit: 2021-08-03 | Discharge: 2021-08-03 | Disposition: A | Discharge status: Home / Self Care | Payer: Medicare HMO | Source: Ambulatory Visit | Attending: Vascular Surgery | Admitting: Vascular Surgery

## 2021-08-03 ENCOUNTER — Ambulatory Visit (INDEPENDENT_AMBULATORY_CARE_PROVIDER_SITE_OTHER): Payer: Medicare HMO | Admitting: Physician Assistant

## 2021-08-03 VITALS — BP 1/1 | HR 87 | Temp 97.2°F | Resp 20 | Ht 65.0 in | Wt 210.9 lb

## 2021-08-03 DIAGNOSIS — Z992 Dependence on renal dialysis: Secondary | ICD-10-CM | POA: Diagnosis not present

## 2021-08-03 DIAGNOSIS — N186 End stage renal disease: Secondary | ICD-10-CM | POA: Diagnosis not present

## 2021-08-03 NOTE — Progress Notes (Signed)
POST OPERATIVE OFFICE NOTE    CC:  F/u for surgery  HPI:  This is a 65 y.o. female who is s/p right BC fistula  on 05/11/21 by Dr. Donzetta Matters.  She is on HD via [facility].  She has a history of steal on the left UE required left subclavian artery stenting for LIMA graft and ligation of her fistula.  Pt returns today for follow up.  Pt denise symptoms of steal.  No pain, loss of motor or sensation in the right UE.  She was recently in the hospital with acute upper respiratory virus on chronic bronchitis.    Allergies  Allergen Reactions   Penicillins Other (See Comments)    REACTION: Unknown, told as a child Has patient had a PCN reaction causing immediate rash, facial/tongue/throat swelling, SOB or lightheadedness with hypotension: Unknown Has patient had a PCN reaction causing severe rash involving mucus membranes or skin necrosis: Unknown Has patient had a PCN reaction that required hospitalization: Unknown Has patient had a PCN reaction occurring within the last 10 years: No If all of the above answers are "NO", then may proceed with Cephalosporin use.    Beta Adrenergic Blockers Other (See Comments)    Hypotension    Current Outpatient Medications  Medication Sig Dispense Refill   albuterol (PROVENTIL) (2.5 MG/3ML) 0.083% nebulizer solution Take 3 mLs (2.5 mg total) by nebulization every 2 (two) hours as needed for wheezing. 75 mL 12   Albuterol Sulfate 108 (90 Base) MCG/ACT AEPB Inhale 2 puffs into the lungs every 6 (six) hours as needed (Shortness of breath).     allopurinol (ZYLOPRIM) 100 MG tablet Take one tablet after hemodialysis, on Tuesday, Thursday and Saturday. (Patient taking differently: Take 100 mg by mouth Every Tuesday,Thursday,and Saturday with dialysis. After dialysis) 30 tablet 11   ALPRAZolam (XANAX) 0.5 MG tablet Take 0.25-0.5 mg by mouth See admin instructions. Take 0.25 in the morning and evening and 0.5 mg at bedtime     aspirin EC 81 MG tablet Take 81 mg by mouth daily.       atorvastatin (LIPITOR) 80 MG tablet Take 1 tablet (80 mg total) by mouth every evening. (Patient taking differently: Take 80 mg by mouth at bedtime.)     budesonide-formoterol (SYMBICORT) 80-4.5 MCG/ACT inhaler Take 2 puffs first thing in am and then another 2 puffs about 12 hours later. 1 each 11   clopidogrel (PLAVIX) 75 MG tablet Take 1 tablet (75 mg total) by mouth daily. 90 tablet 3   cyclobenzaprine (FLEXERIL) 5 MG tablet Take 1 tablet (5 mg total) by mouth 2 (two) times daily with a meal. 30 tablet 0   diclofenac Sodium (VOLTAREN) 1 % GEL Apply topically.     ezetimibe (ZETIA) 10 MG tablet Take 1 tablet (10 mg total) by mouth daily. (Patient taking differently: Take 10 mg by mouth at bedtime.) 30 tablet 11   ferric citrate (AURYXIA) 1 GM 210 MG(Fe) tablet Take 420 mg by mouth 3 (three) times daily with meals.     gabapentin (NEURONTIN) 400 MG capsule Take 1 capsule (400 mg total) by mouth at bedtime.     ipratropium (ATROVENT HFA) 17 MCG/ACT inhaler Inhale 2 puffs into the lungs every 4 (four) hours as needed for wheezing.      levothyroxine (SYNTHROID) 125 MCG tablet Take 125 mcg by mouth daily.     Methoxy PEG-Epoetin Beta (MIRCERA IJ) Mircera     midodrine (PROAMATINE) 10 MG tablet Take 1 tablet (10 mg total)  by mouth 2 (two) times daily with a meal. 60 tablet 0   multivitamin (RENA-VIT) TABS tablet Take 1 tablet by mouth daily.     nitroGLYCERIN (NITROSTAT) 0.4 MG SL tablet Place 1 tablet (0.4 mg total) under the tongue every 5 (five) minutes x 3 doses as needed for chest pain. 100 tablet 3   NOVOLIN 70/30 KWIKPEN (70-30) 100 UNIT/ML KwikPen Inject 35 Units into the skin in the morning and at bedtime. 15 mL 11   ondansetron (ZOFRAN) 8 MG tablet Take 8 mg by mouth every 8 (eight) hours as needed for nausea.     ONETOUCH ULTRA test strip Check blood glucose THREE TIMES DAILY     pantoprazole (PROTONIX) 40 MG tablet Take 1 tablet (40 mg total) by mouth 2 (two) times daily. 60 tablet 1    ranolazine (RANEXA) 500 MG 12 hr tablet Take 1 tablet (500 mg total) by mouth at bedtime.     VITAMIN D PO Take by mouth.     Vitamin D, Ergocalciferol, (DRISDOL) 1.25 MG (50000 UNIT) CAPS capsule Take 50,000 Units by mouth every Sunday.      No current facility-administered medications for this visit.     ROS:  See HPI  Physical Exam:      Findings:  +--------------------+----------+-----------------+--------+  AVF                 PSV (cm/s)Flow Vol (mL/min)Comments  +--------------------+----------+-----------------+--------+  Native artery inflow   188           725                 +--------------------+----------+-----------------+--------+  AVF Anastomosis        318                               +--------------------+----------+-----------------+--------+      +------------+----------+-------------+----------+-------------------------  ----+  OUTFLOW VEINPSV (cm/s)Diameter (cm)Depth (cm)          Describe              +------------+----------+-------------+----------+-------------------------  ----+  Shoulder       111        0.32        2.16                                   +------------+----------+-------------+----------+-------------------------  ----+  Prox UA        129        0.49        1.26                                   +------------+----------+-------------+----------+-------------------------  ----+  Mid UA          39         0.52        0.76   becomes slightly more  narrow                                                         in diameter            +------------+----------+-------------+----------+-------------------------  ----+  Dist UA  175        0.91        0.24         competing branch          +------------+----------+-------------+----------+-------------------------  ----+  AC Fossa       118        1.14        0.22                                    +------------+----------+-------------+----------+-------------------------  ----+     Summary:  Patent arteriovenous fistula.  Arteriovenous fistula-Velocities less   Incision:  well healed right AC incision Extremities:  Palpable distal trill/pulsatile fistula.  The fistula dives deep and is not easily palpable in the proximal 1/2. Neuro: sensation intact, grip 5/5 right UE Lungs Inspiratory wheezing   Assessment/Plan:  This is a 65 y.o. female who is s/p: right BC fistula The fistula duplex shows small diameter of the proximal fistula and depth > 0.6 cm   No symptoms of steal.  I will schedule her for superficialization of the right BC fistula.  Her HD days are TTS.  She has a working right [place]ack[place].    Roxy Horseman PA-C [facility] [phone]   Clinic MD:  Donzetta Matters

## 2021-08-04 DIAGNOSIS — E1122 Type 2 diabetes mellitus with diabetic chronic kidney disease: Secondary | ICD-10-CM | POA: Diagnosis not present

## 2021-08-04 DIAGNOSIS — Z992 Dependence on renal dialysis: Secondary | ICD-10-CM | POA: Diagnosis not present

## 2021-08-04 DIAGNOSIS — D689 Coagulation defect, unspecified: Secondary | ICD-10-CM | POA: Diagnosis not present

## 2021-08-04 DIAGNOSIS — N186 End stage renal disease: Secondary | ICD-10-CM | POA: Diagnosis not present

## 2021-08-04 DIAGNOSIS — N2581 Secondary hyperparathyroidism of renal origin: Secondary | ICD-10-CM | POA: Diagnosis not present

## 2021-08-06 DIAGNOSIS — N186 End stage renal disease: Secondary | ICD-10-CM | POA: Diagnosis not present

## 2021-08-06 DIAGNOSIS — Z992 Dependence on renal dialysis: Secondary | ICD-10-CM | POA: Diagnosis not present

## 2021-08-06 DIAGNOSIS — N2581 Secondary hyperparathyroidism of renal origin: Secondary | ICD-10-CM | POA: Diagnosis not present

## 2021-08-06 DIAGNOSIS — D689 Coagulation defect, unspecified: Secondary | ICD-10-CM | POA: Diagnosis not present

## 2021-08-06 DIAGNOSIS — E1122 Type 2 diabetes mellitus with diabetic chronic kidney disease: Secondary | ICD-10-CM | POA: Diagnosis not present

## 2021-08-09 DIAGNOSIS — N186 End stage renal disease: Secondary | ICD-10-CM | POA: Diagnosis not present

## 2021-08-09 DIAGNOSIS — N2581 Secondary hyperparathyroidism of renal origin: Secondary | ICD-10-CM | POA: Diagnosis not present

## 2021-08-09 DIAGNOSIS — Z992 Dependence on renal dialysis: Secondary | ICD-10-CM | POA: Diagnosis not present

## 2021-08-09 DIAGNOSIS — D509 Iron deficiency anemia, unspecified: Secondary | ICD-10-CM | POA: Diagnosis not present

## 2021-08-09 DIAGNOSIS — D689 Coagulation defect, unspecified: Secondary | ICD-10-CM | POA: Diagnosis not present

## 2021-08-10 DIAGNOSIS — Z20828 Contact with and (suspected) exposure to other viral communicable diseases: Secondary | ICD-10-CM | POA: Diagnosis not present

## 2021-08-10 DIAGNOSIS — Z992 Dependence on renal dialysis: Secondary | ICD-10-CM | POA: Diagnosis not present

## 2021-08-10 DIAGNOSIS — E1140 Type 2 diabetes mellitus with diabetic neuropathy, unspecified: Secondary | ICD-10-CM | POA: Diagnosis not present

## 2021-08-10 DIAGNOSIS — I209 Angina pectoris, unspecified: Secondary | ICD-10-CM | POA: Diagnosis not present

## 2021-08-10 DIAGNOSIS — Z951 Presence of aortocoronary bypass graft: Secondary | ICD-10-CM | POA: Diagnosis not present

## 2021-08-10 DIAGNOSIS — J449 Chronic obstructive pulmonary disease, unspecified: Secondary | ICD-10-CM | POA: Diagnosis not present

## 2021-08-10 DIAGNOSIS — J441 Chronic obstructive pulmonary disease with (acute) exacerbation: Secondary | ICD-10-CM | POA: Diagnosis not present

## 2021-08-10 DIAGNOSIS — Z8679 Personal history of other diseases of the circulatory system: Secondary | ICD-10-CM | POA: Diagnosis not present

## 2021-08-12 DIAGNOSIS — Z992 Dependence on renal dialysis: Secondary | ICD-10-CM | POA: Diagnosis not present

## 2021-08-12 DIAGNOSIS — N2581 Secondary hyperparathyroidism of renal origin: Secondary | ICD-10-CM | POA: Diagnosis not present

## 2021-08-12 DIAGNOSIS — D689 Coagulation defect, unspecified: Secondary | ICD-10-CM | POA: Diagnosis not present

## 2021-08-12 DIAGNOSIS — D509 Iron deficiency anemia, unspecified: Secondary | ICD-10-CM | POA: Diagnosis not present

## 2021-08-12 DIAGNOSIS — N186 End stage renal disease: Secondary | ICD-10-CM | POA: Diagnosis not present

## 2021-08-14 DIAGNOSIS — N186 End stage renal disease: Secondary | ICD-10-CM | POA: Diagnosis not present

## 2021-08-14 DIAGNOSIS — J441 Chronic obstructive pulmonary disease with (acute) exacerbation: Secondary | ICD-10-CM | POA: Diagnosis not present

## 2021-08-14 DIAGNOSIS — R140 Abdominal distension (gaseous): Secondary | ICD-10-CM | POA: Diagnosis not present

## 2021-08-14 DIAGNOSIS — I739 Peripheral vascular disease, unspecified: Secondary | ICD-10-CM | POA: Diagnosis not present

## 2021-08-14 DIAGNOSIS — D689 Coagulation defect, unspecified: Secondary | ICD-10-CM | POA: Diagnosis not present

## 2021-08-14 DIAGNOSIS — Z951 Presence of aortocoronary bypass graft: Secondary | ICD-10-CM | POA: Diagnosis not present

## 2021-08-14 DIAGNOSIS — N2581 Secondary hyperparathyroidism of renal origin: Secondary | ICD-10-CM | POA: Diagnosis not present

## 2021-08-14 DIAGNOSIS — I209 Angina pectoris, unspecified: Secondary | ICD-10-CM | POA: Diagnosis not present

## 2021-08-14 DIAGNOSIS — Z8679 Personal history of other diseases of the circulatory system: Secondary | ICD-10-CM | POA: Diagnosis not present

## 2021-08-14 DIAGNOSIS — J45901 Unspecified asthma with (acute) exacerbation: Secondary | ICD-10-CM | POA: Diagnosis not present

## 2021-08-14 DIAGNOSIS — M7989 Other specified soft tissue disorders: Secondary | ICD-10-CM | POA: Diagnosis not present

## 2021-08-14 DIAGNOSIS — Z992 Dependence on renal dialysis: Secondary | ICD-10-CM | POA: Diagnosis not present

## 2021-08-16 DIAGNOSIS — D689 Coagulation defect, unspecified: Secondary | ICD-10-CM | POA: Diagnosis not present

## 2021-08-16 DIAGNOSIS — Z992 Dependence on renal dialysis: Secondary | ICD-10-CM | POA: Diagnosis not present

## 2021-08-16 DIAGNOSIS — N186 End stage renal disease: Secondary | ICD-10-CM | POA: Diagnosis not present

## 2021-08-16 DIAGNOSIS — N2581 Secondary hyperparathyroidism of renal origin: Secondary | ICD-10-CM | POA: Diagnosis not present

## 2021-08-17 DIAGNOSIS — Z992 Dependence on renal dialysis: Secondary | ICD-10-CM | POA: Diagnosis not present

## 2021-08-17 DIAGNOSIS — N186 End stage renal disease: Secondary | ICD-10-CM | POA: Diagnosis not present

## 2021-08-17 DIAGNOSIS — E1122 Type 2 diabetes mellitus with diabetic chronic kidney disease: Secondary | ICD-10-CM | POA: Diagnosis not present

## 2021-08-18 DIAGNOSIS — N2581 Secondary hyperparathyroidism of renal origin: Secondary | ICD-10-CM | POA: Diagnosis not present

## 2021-08-18 DIAGNOSIS — N186 End stage renal disease: Secondary | ICD-10-CM | POA: Diagnosis not present

## 2021-08-18 DIAGNOSIS — Z992 Dependence on renal dialysis: Secondary | ICD-10-CM | POA: Diagnosis not present

## 2021-08-18 DIAGNOSIS — D689 Coagulation defect, unspecified: Secondary | ICD-10-CM | POA: Diagnosis not present

## 2021-08-20 DIAGNOSIS — N2581 Secondary hyperparathyroidism of renal origin: Secondary | ICD-10-CM | POA: Diagnosis not present

## 2021-08-20 DIAGNOSIS — Z992 Dependence on renal dialysis: Secondary | ICD-10-CM | POA: Diagnosis not present

## 2021-08-20 DIAGNOSIS — N186 End stage renal disease: Secondary | ICD-10-CM | POA: Diagnosis not present

## 2021-08-20 DIAGNOSIS — D689 Coagulation defect, unspecified: Secondary | ICD-10-CM | POA: Diagnosis not present

## 2021-08-23 DIAGNOSIS — N2581 Secondary hyperparathyroidism of renal origin: Secondary | ICD-10-CM | POA: Diagnosis not present

## 2021-08-23 DIAGNOSIS — N186 End stage renal disease: Secondary | ICD-10-CM | POA: Diagnosis not present

## 2021-08-23 DIAGNOSIS — D509 Iron deficiency anemia, unspecified: Secondary | ICD-10-CM | POA: Diagnosis not present

## 2021-08-23 DIAGNOSIS — D689 Coagulation defect, unspecified: Secondary | ICD-10-CM | POA: Diagnosis not present

## 2021-08-23 DIAGNOSIS — Z992 Dependence on renal dialysis: Secondary | ICD-10-CM | POA: Diagnosis not present

## 2021-08-24 ENCOUNTER — Emergency Department (HOSPITAL_COMMUNITY)
Admission: EM | Admit: 2021-08-24 | Discharge: 2021-08-24 | Disposition: A | Discharge status: Home / Self Care | Payer: Medicare HMO | Source: Home / Self Care | Attending: Emergency Medicine | Admitting: Emergency Medicine

## 2021-08-24 ENCOUNTER — Emergency Department (HOSPITAL_COMMUNITY): Payer: Medicare HMO

## 2021-08-24 ENCOUNTER — Encounter (HOSPITAL_COMMUNITY): Payer: Self-pay

## 2021-08-24 ENCOUNTER — Other Ambulatory Visit: Payer: Self-pay

## 2021-08-24 DIAGNOSIS — J45901 Unspecified asthma with (acute) exacerbation: Secondary | ICD-10-CM | POA: Insufficient documentation

## 2021-08-24 DIAGNOSIS — Z7982 Long term (current) use of aspirin: Secondary | ICD-10-CM | POA: Insufficient documentation

## 2021-08-24 DIAGNOSIS — E039 Hypothyroidism, unspecified: Secondary | ICD-10-CM | POA: Insufficient documentation

## 2021-08-24 DIAGNOSIS — Z8616 Personal history of COVID-19: Secondary | ICD-10-CM | POA: Insufficient documentation

## 2021-08-24 DIAGNOSIS — Z87891 Personal history of nicotine dependence: Secondary | ICD-10-CM | POA: Insufficient documentation

## 2021-08-24 DIAGNOSIS — I132 Hypertensive heart and chronic kidney disease with heart failure and with stage 5 chronic kidney disease, or end stage renal disease: Secondary | ICD-10-CM | POA: Insufficient documentation

## 2021-08-24 DIAGNOSIS — R062 Wheezing: Secondary | ICD-10-CM | POA: Insufficient documentation

## 2021-08-24 DIAGNOSIS — Z7902 Long term (current) use of antithrombotics/antiplatelets: Secondary | ICD-10-CM | POA: Insufficient documentation

## 2021-08-24 DIAGNOSIS — R0789 Other chest pain: Secondary | ICD-10-CM | POA: Diagnosis not present

## 2021-08-24 DIAGNOSIS — N186 End stage renal disease: Secondary | ICD-10-CM | POA: Diagnosis not present

## 2021-08-24 DIAGNOSIS — J811 Chronic pulmonary edema: Secondary | ICD-10-CM | POA: Diagnosis not present

## 2021-08-24 DIAGNOSIS — I120 Hypertensive chronic kidney disease with stage 5 chronic kidney disease or end stage renal disease: Secondary | ICD-10-CM | POA: Diagnosis not present

## 2021-08-24 DIAGNOSIS — E1122 Type 2 diabetes mellitus with diabetic chronic kidney disease: Secondary | ICD-10-CM | POA: Insufficient documentation

## 2021-08-24 DIAGNOSIS — I2510 Atherosclerotic heart disease of native coronary artery without angina pectoris: Secondary | ICD-10-CM | POA: Insufficient documentation

## 2021-08-24 DIAGNOSIS — Z992 Dependence on renal dialysis: Secondary | ICD-10-CM | POA: Insufficient documentation

## 2021-08-24 DIAGNOSIS — I700 Atherosclerosis of aorta: Secondary | ICD-10-CM | POA: Diagnosis not present

## 2021-08-24 DIAGNOSIS — R0602 Shortness of breath: Secondary | ICD-10-CM | POA: Insufficient documentation

## 2021-08-24 DIAGNOSIS — Z79899 Other long term (current) drug therapy: Secondary | ICD-10-CM | POA: Insufficient documentation

## 2021-08-24 DIAGNOSIS — I214 Non-ST elevation (NSTEMI) myocardial infarction: Secondary | ICD-10-CM | POA: Diagnosis not present

## 2021-08-24 DIAGNOSIS — T82855A Stenosis of coronary artery stent, initial encounter: Secondary | ICD-10-CM | POA: Diagnosis not present

## 2021-08-24 DIAGNOSIS — J441 Chronic obstructive pulmonary disease with (acute) exacerbation: Secondary | ICD-10-CM | POA: Insufficient documentation

## 2021-08-24 DIAGNOSIS — I509 Heart failure, unspecified: Secondary | ICD-10-CM | POA: Insufficient documentation

## 2021-08-24 DIAGNOSIS — R0600 Dyspnea, unspecified: Secondary | ICD-10-CM

## 2021-08-24 DIAGNOSIS — Z951 Presence of aortocoronary bypass graft: Secondary | ICD-10-CM | POA: Insufficient documentation

## 2021-08-24 DIAGNOSIS — I517 Cardiomegaly: Secondary | ICD-10-CM | POA: Diagnosis not present

## 2021-08-24 LAB — COMPREHENSIVE METABOLIC PANEL
ALT: 17 U/L (ref 0–44)
AST: 21 U/L (ref 15–41)
Albumin: 3.5 g/dL (ref 3.5–5.0)
Alkaline Phosphatase: 88 U/L (ref 38–126)
Anion gap: 13 (ref 5–15)
BUN: 32 mg/dL — ABNORMAL HIGH (ref 8–23)
CO2: 26 mmol/L (ref 22–32)
Calcium: 9.6 mg/dL (ref 8.9–10.3)
Chloride: 98 mmol/L (ref 98–111)
Creatinine, Ser: 5.62 mg/dL — ABNORMAL HIGH (ref 0.44–1.00)
GFR, Estimated: 8 mL/min — ABNORMAL LOW (ref >60)
Glucose, Bld: 210 mg/dL — ABNORMAL HIGH (ref 70–99)
Potassium: 4.2 mmol/L (ref 3.5–5.1)
Sodium: 137 mmol/L (ref 135–145)
Total Bilirubin: 0.4 mg/dL (ref 0.3–1.2)
Total Protein: 7 g/dL (ref 6.5–8.1)

## 2021-08-24 LAB — CBC
HCT: 36.5 % (ref 36.0–46.0)
Hemoglobin: 11.5 g/dL — ABNORMAL LOW (ref 12.0–15.0)
MCH: 34.1 pg — ABNORMAL HIGH (ref 26.0–34.0)
MCHC: 31.5 g/dL (ref 30.0–36.0)
MCV: 108.3 fL — ABNORMAL HIGH (ref 80.0–100.0)
Platelets: 157 10*3/uL (ref 150–400)
RBC: 3.37 MIL/uL — ABNORMAL LOW (ref 3.87–5.11)
RDW: 14.6 % (ref 11.5–15.5)
WBC: 10.3 10*3/uL (ref 4.0–10.5)
nRBC: 0 % (ref 0.0–0.2)

## 2021-08-24 MED ORDER — METHYLPREDNISOLONE SODIUM SUCC 125 MG IJ SOLR: 125.0000 mg | Freq: Once | INTRAMUSCULAR | Status: DC

## 2021-08-24 MED ORDER — ALBUTEROL SULFATE (2.5 MG/3ML) 0.083% IN NEBU
2.5000 mg | INHALATION_SOLUTION | Freq: Once | RESPIRATORY_TRACT | Status: AC
  Administered 2021-08-24: 2.5 mg via RESPIRATORY_TRACT
  Filled 2021-08-24: qty 3

## 2021-08-24 MED ORDER — MAGNESIUM SULFATE 2 GM/50ML IV SOLN
2.0000 g | Freq: Once | INTRAVENOUS | Status: DC
  Filled 2021-08-24: qty 50

## 2021-08-24 MED ORDER — IPRATROPIUM-ALBUTEROL 0.5-2.5 (3) MG/3ML IN SOLN
3.0000 mL | Freq: Once | RESPIRATORY_TRACT | Status: AC
  Administered 2021-08-24: 3 mL via RESPIRATORY_TRACT
  Filled 2021-08-24: qty 3

## 2021-08-24 MED ORDER — METHYLPREDNISOLONE SODIUM SUCC 125 MG IJ SOLR
125.0000 mg | Freq: Once | INTRAMUSCULAR | Status: DC
  Filled 2021-08-24: qty 2

## 2021-08-24 MED ORDER — ALBUTEROL SULFATE HFA 108 (90 BASE) MCG/ACT IN AERS: 2.0000 | INHALATION_SPRAY | RESPIRATORY_TRACT | Status: DC | PRN

## 2021-08-24 MED ORDER — METHYLPREDNISOLONE SODIUM SUCC 125 MG IJ SOLR
125.0000 mg | Freq: Once | INTRAMUSCULAR | Status: AC
  Administered 2021-08-24: 125 mg via INTRAMUSCULAR

## 2021-08-24 NOTE — ED Notes (Signed)
Multiple nurses in to attempt IV. Pt now refusing to be stuck again, EDP aware.

## 2021-08-24 NOTE — ED Triage Notes (Signed)
Pt reports sob that started today, reports productive cough, pt breathing is WNL, no distress, can speak full sentences. No fever. Reports pain in back between shoulder blades that started today.

## 2021-08-24 NOTE — ED Provider Notes (Signed)
[facility] Provider Note   CSN: [id number] Arrival date & time: 08/24/21  2023     History Chief Complaint  Patient presents with   Shortness of Breath    Donna Howe is a 65 y.o. female.  Patient presents with persistent shortness of breath and wheezing.  Patient has COPD and congestive heart failure.  The history is provided by the patient and medical records. No language interpreter was used.  Shortness of Breath Severity:  Moderate Onset quality:  Sudden Timing:  Constant Progression:  Waxing and waning Chronicity:  Recurrent Context: activity   Relieved by:  Nothing Worsened by:  Nothing Ineffective treatments:  None tried Associated symptoms: no abdominal pain, no chest pain, no cough, no headaches and no rash       Past Medical History:  Diagnosis Date   Anemia    Anxiety    Asthma    CAD (coronary artery disease)    Multivessel s/p CABG 2005, numerous PCIs since that time and documented graft disease   Carotid artery disease ([place])    R CEA   ESRD on hemodialysis ([place])    Essential hypertension    Gout    History of blood transfusion    Hyperlipidemia    Hypothyroidism    Myocardial infarction [facility])    PAD (peripheral artery disease) ([place])    Dr. Kellie Simmering   Pneumonia 09/2019, 11/2019   S/P angioplasty with stent- DES to mRCA and to LIMA to LAD with DES 04/09/18.   04/10/2018   SBO (small bowel obstruction) ([place]) 2011   Status post lysis of adhesions & hernia repair   Sinus bradycardia    Type 2 diabetes mellitus ([place])    Umbilical hernia     Patient Active Problem List   Diagnosis Date Noted   COPD (chronic obstructive pulmonary disease) ([place]) 07/13/2021   Insulin dependent type 2 diabetes mellitus ([place])    Acute respiratory failure ([place]) 07/12/2021   Nausea 07/02/2021   Pulmonary edema 06/14/2021   Other fluid overload 03/01/2021   Stenosis of left subclavian artery (HCC)    Non-ST elevation (NSTEMI) myocardial  infarction ([place]) 01/12/2021   Leukocytosis 01/09/2021   Elevated MCV 01/09/2021   Diabetic neuropathy ([place]) 01/09/2021   COVID-19 09/30/2020   Unspecified protein-calorie malnutrition ([place]) 07/05/2020   Allergy, unspecified, initial encounter 06/30/2020   Anaphylactic shock, unspecified, initial encounter 06/30/2020   Anemia in chronic kidney disease 06/30/2020   Coagulation defect, unspecified ([place]) 06/30/2020   Diarrhea, unspecified 06/30/2020   Encounter for immunization 06/30/2020   Iron deficiency anemia, unspecified 06/30/2020   Pain, unspecified 06/30/2020   PAD (peripheral artery disease) ([place]) 06/30/2020   Pruritus, unspecified 06/30/2020   Secondary hyperparathyroidism of renal origin ([place]) 06/30/2020   ESRD (end stage renal disease) (HCC)    SOB (shortness of breath)    Chronic diastolic HF (heart failure) (HCC)    GERD (gastroesophageal reflux disease)    Acute renal failure superimposed on stage 5 chronic kidney disease, not on chronic dialysis ([place]) 06/13/2020   Hyperglycemia due to diabetes mellitus ([place]) 06/13/2020   Pneumonia 12/17/2019   Cough variant asthma with component of UACS 11/20/2019   Elevated troponin I level 10/02/2019   Chest pain 10/01/2019   Unstable angina ([place]) 04/18/2018   Hypothyroidism (acquired) 04/18/2018   S/P angioplasty with stent- DES to [facility] and to LIMA to LAD with DES 04/09/18.   04/10/2018   Angina pectoris ([place]) 04/05/2018   Status post coronary artery  stent placement    Acute coronary syndrome ([place]) 05/31/2017   Acute chest pain    History of coronary artery disease    Diabetes mellitus with ESRD (end-stage renal disease) ([place]) 10/28/2013   Hypoglycemia associated with diabetes ([place]) 10/28/2013   Thrombocytopenia, unspecified ([place]) 10/28/2013   Hypokalemia 10/27/2013   Syncope 10/26/2013   Anemia 10/26/2013   AKI (acute kidney injury) ([place]) 10/26/2013   Fracture of toe of right foot 28/41/3244   Umbilical hernia    Carotid  artery disease ([place])    Occlusion and stenosis of carotid artery without mention of cerebral infarction 04/15/2013   Hx of CABG    Ejection fraction    Atherosclerosis of native artery of extremity with intermittent claudication ([place]) 02/11/2013   Diabetes mellitus with renal manifestation ([place]) 01/21/2013   Bradycardia 01/03/2013   Chronic kidney disease (CKD), stage III (moderate) (HCC)    Gout    PROTEINURIA, MILD 01/18/2010   Obesity (BMI 30-39.9) 08/26/2009   Asthma, chronic, unspecified asthma severity, with acute exacerbation 08/26/2009   Mixed hyperlipidemia 03/31/2007   Essential hypertension 03/31/2007   CAD (coronary artery disease) 03/31/2007    Past Surgical History:  Procedure Laterality Date   AORTIC ARCH ANGIOGRAPHY N/A 08/02/2020   Procedure: AORTIC ARCH ANGIOGRAPHY;  Surgeon: Waynetta Sandy, MD;  Location: [facility] CV LAB;  Service: Cardiovascular;  Laterality: N/A;  Lt upper extermity   AV FISTULA PLACEMENT Left 06/29/2020   Procedure: LEFT ARM ARTERIOVENOUS (AV) FISTULA;  Surgeon: Waynetta Sandy, MD;  Location: [place];  Service: Vascular;  Laterality: Left;  ARM   AV FISTULA PLACEMENT Right 05/11/2021   Procedure: RIGHT BRACHIOCEPHALIC ARTERIOVENOUS (AV) FISTULA CREATION;  Surgeon: Waynetta Sandy, MD;  Location[facility];  Service: Vascular;  Laterality: Right;   [place]  2010   CORONARY ARTERY BYPASS GRAFT  2005   CORONARY BALLOON ANGIOPLASTY N/A 05/31/2017   Procedure: CORONARY BALLOON ANGIOPLASTY;  Surgeon: Jettie Booze, MD;  Location: [place] [facility];  Service: Cardiovascular;  Laterality: N/A;   CORONARY BALLOON ANGIOPLASTY N/A 06/14/2021   Procedure: CORONARY BALLOON ANGIOPLASTY;  Surgeon: Jettie Booze, MD;  Location: [place] [facility];  Service: Cardiovascular;  Laterality: N/A;   CORONARY STENT INTERVENTION N/A 05/31/2017   Procedure: CORONARY STENT INTERVENTION;  Surgeon:  Jettie Booze, MD;  Location: [place] [facility] LAB;  Service: Cardiovascular;  Laterality: N/A;   CORONARY STENT INTERVENTION N/A 04/09/2018   Procedure: CORONARY STENT INTERVENTION;  Surgeon: Jettie Booze, MD;  Location: [place] [facility];  Service: Cardiovascular;  Laterality: N/A;  SVG RCA   CORONARY STENT INTERVENTION N/A 01/10/2021   Procedure: CORONARY STENT INTERVENTION;  Surgeon: Leonie Man, MD;  Location: [place] [facility] LAB;  Service: Cardiovascular;  Laterality: N/A;   CORONARY/GRAFT ACUTE MI REVASCULARIZATION N/A 02/24/2021   Procedure: Coronary/Graft Acute MI Revascularization;  Surgeon: Jettie Booze, MD;  Location: [place] [facility];  Service: Cardiovascular;  Laterality: N/A;   ENDARTERECTOMY Right 04/18/2013   Procedure: ENDARTERECTOMY CAROTID;  Surgeon: Mal Misty, MD;  Location: [facility];  Service: Vascular;  Laterality: Right;   HERNIA REPAIR  1989   Incisional hernia repair x2  03/04/2010   Laparoscopic with 35cm mesh by Dr Ronnald Collum   IR [place] CV LINE RIGHT  06/21/2020   IR US GUIDE VASC ACCESS RIGHT  06/21/2020   LEFT HEART CATH AND CORS/GRAFTS ANGIOGRAPHY N/A 05/31/2017   Procedure:  LEFT HEART CATH AND CORS/GRAFTS ANGIOGRAPHY;  Surgeon: Jettie Booze, MD;  Location: [place] [facility];  Service: Cardiovascular;  Laterality: N/A;   LEFT HEART CATH AND CORS/GRAFTS ANGIOGRAPHY N/A 04/08/2018   Procedure: LEFT HEART CATH AND CORS/GRAFTS ANGIOGRAPHY;  Surgeon: Jettie Booze, MD;  Location: [place] [facility];  Service: Cardiovascular;  Laterality: N/A;   LEFT HEART CATH AND CORS/GRAFTS ANGIOGRAPHY N/A 06/22/2020   Procedure: LEFT HEART CATH AND CORS/GRAFTS ANGIOGRAPHY;  Surgeon: Belva Crome, MD;  Location: [place] [facility];  Service: Cardiovascular;  Laterality: N/A;   LEFT HEART CATH AND CORS/GRAFTS ANGIOGRAPHY N/A 01/10/2021   Procedure: LEFT HEART CATH AND CORS/GRAFTS ANGIOGRAPHY;  Surgeon: Leonie Man, MD;  Location: [place] [facility];  Service: Cardiovascular;  Laterality: N/A;   LEFT HEART CATH AND CORS/GRAFTS ANGIOGRAPHY N/A 02/24/2021   Procedure: LEFT HEART CATH AND CORS/GRAFTS ANGIOGRAPHY;  Surgeon: Jettie Booze, MD;  Location: [place] [facility] LAB;  Service: Cardiovascular;  Laterality: N/A;   LEFT HEART CATH AND CORS/GRAFTS ANGIOGRAPHY N/A 06/14/2021   Procedure: LEFT HEART CATH AND CORS/GRAFTS ANGIOGRAPHY;  Surgeon: Jettie Booze, MD;  Location: [place] [facility];  Service: Cardiovascular;  Laterality: N/A;   LEFT HEART CATHETERIZATION WITH CORONARY ANGIOGRAM N/A 12/19/2012   Procedure: LEFT HEART CATHETERIZATION WITH CORONARY ANGIOGRAM;  Surgeon: Josue Hector, MD;  Location: [facility] CATH LAB;  Service: Cardiovascular;  Laterality: N/A;   LEFT HEART CATHETERIZATION WITH CORONARY/GRAFT ANGIOGRAM N/A 04/19/2013   Procedure: LEFT HEART CATHETERIZATION WITH Beatrix Fetters;  Surgeon: Lorretta Harp, MD;  Location: [facility] CATH LAB;  Service: Cardiovascular;  Laterality: N/A;   LIGATION OF ARTERIOVENOUS  FISTULA Left 09/15/2020   Procedure: LIGATION OF LEFT ARM ARTERIOVENOUS  FISTULA;  Surgeon: Waynetta Sandy, MD;  Location: [facility];  Service: Vascular;  Laterality: Left;   PATCH ANGIOPLASTY Right 04/18/2013   Procedure: PATCH ANGIOPLASTY Right Internal Carotid Artery;  Surgeon: Mal Misty, MD;  Location: [facility];  Service: Vascular;  Laterality: Right;   PERCUTANEOUS CORONARY STENT INTERVENTION (PCI-S) Right 12/19/2012   Procedure: PERCUTANEOUS CORONARY STENT INTERVENTION (PCI-S);  Surgeon: Josue Hector, MD;  Location: [facility] CATH LAB;  Service: Cardiovascular;  Laterality: Right;   PERIPHERAL VASCULAR INTERVENTION Left 08/02/2020   Procedure: PERIPHERAL VASCULAR INTERVENTION;  Surgeon: Waynetta Sandy, MD;  Location: [facility];  Service: Cardiovascular;  Laterality: Left;  Left subclavian   PERIPHERAL VASCULAR INTERVENTION  02/24/2021   Procedure: PERIPHERAL VASCULAR  INTERVENTION;  Surgeon: Marty Heck, MD;  Location: [place] [facility];  Service: Vascular;;   SHOULDER SURGERY       OB History   No obstetric history on file.     Family History  Problem Relation Age of Onset   Diabetes Mother    Heart disease Mother        before age 70   Hyperlipidemia Mother    Hypertension Mother    Thyroid disease Father    Hypertension Father    AAA (abdominal aortic aneurysm) Father    Heart disease Brother        before age 44   Hypertension Brother    Hyperlipidemia Son    Hypertension Son     Social History   Tobacco Use   Smoking status: Former    Packs/day: 1.00    Years: 20.00    Pack years: 20.00    Types: Cigarettes    Quit date: 12/10/2012    Years  since quitting: 8.7   Smokeless tobacco: Never   Tobacco comments:    Not currently smoking   Vaping Use   Vaping Use: Never used  Substance Use Topics   Alcohol use: No    Alcohol/week: 0.0 standard drinks   Drug use: No    Home Medications Prior to Admission medications   Medication Sig Start Date End Date Taking? Authorizing Provider  albuterol (PROVENTIL) (2.5 MG/3ML) 0.083% nebulizer solution Take 3 mLs (2.5 mg total) by nebulization every 2 (two) hours as needed for wheezing. 07/15/21   Kathie Dike, MD  Albuterol Sulfate 108 (90 Base) MCG/ACT AEPB Inhale 2 puffs into the lungs every 6 (six) hours as needed (Shortness of breath).    [provider]  allopurinol (ZYLOPRIM) 100 MG tablet Take one tablet after hemodialysis, on Tuesday, Thursday and Saturday. Patient taking differently: Take 100 mg by mouth Every Tuesday,Thursday,and Saturday with dialysis. After dialysis 06/30/20   Arrien, Jimmy Picket, MD  ALPRAZolam Duanne Moron) 0.5 MG tablet Take 0.25-0.5 mg by mouth See admin instructions. Take 0.25 in the morning and evening and 0.5 mg at bedtime    [provider]  aspirin EC 81 MG tablet Take 81 mg by mouth daily.     [provider]   atorvastatin (LIPITOR) 80 MG tablet Take 1 tablet (80 mg total) by mouth every evening. Patient taking differently: Take 80 mg by mouth at bedtime. 12/19/19   Johnson, Clanford L, MD  budesonide-formoterol (SYMBICORT) 80-4.5 MCG/ACT inhaler Take 2 puffs first thing in am and then another 2 puffs about 12 hours later. 07/22/21   Tanda Rockers, MD  clopidogrel (PLAVIX) 75 MG tablet Take 1 tablet (75 mg total) by mouth daily. 08/27/18   Satira Sark, MD  cyclobenzaprine (FLEXERIL) 5 MG tablet Take 1 tablet (5 mg total) by mouth 2 (two) times daily with a meal. Patient taking differently: Take 5 mg by mouth 3 (three) times daily as needed for muscle spasms. 06/17/21   Domenic Polite, MD  diclofenac Sodium (VOLTAREN) 1 % GEL Apply 2 g topically daily as needed (Pain). 06/24/21   [provider]  ezetimibe (ZETIA) 10 MG tablet Take 1 tablet (10 mg total) by mouth daily. Patient taking differently: Take 10 mg by mouth at bedtime. 02/25/21   Bhagat, Crista Luria, PA  ferric citrate (AURYXIA) 1 GM 210 MG(Fe) tablet Take 420 mg by mouth 3 (three) times daily with meals.    [provider]  gabapentin (NEURONTIN) 400 MG capsule Take 1 capsule (400 mg total) by mouth at bedtime. 06/17/21   Domenic Polite, MD  ipratropium (ATROVENT HFA) 17 MCG/ACT inhaler Inhale 2 puffs into the lungs every 4 (four) hours as needed for wheezing.     [provider]  levothyroxine (SYNTHROID) 125 MCG tablet Take 125 mcg by mouth daily before breakfast. 07/25/21   [provider]  Methoxy PEG-Epoetin Beta (MIRCERA IJ) Mircera 07/30/21 07/29/22  [provider]  midodrine (PROAMATINE) 10 MG tablet Take 1 tablet (10 mg total) by mouth 2 (two) times daily with a meal. 06/17/21   Domenic Polite, MD  multivitamin (RENA-VIT) TABS tablet Take 1 tablet by mouth at bedtime.    [provider]  nitroGLYCERIN (NITROSTAT) 0.4 MG SL tablet Place 1 tablet (0.4 mg total) under the tongue  every 5 (five) minutes x 3 doses as needed for chest pain. 02/25/21   Bhagat, Bhavinkumar, PA  NOVOLIN 70/30 KWIKPEN (70-30) 100 UNIT/ML KwikPen Inject 35 Units into the  skin in the morning and at bedtime. Patient taking differently: Inject 24 Units into the skin in the morning and at bedtime. 07/15/21   Kathie Dike, MD  ondansetron (ZOFRAN) 8 MG tablet Take 8 mg by mouth every 8 (eight) hours as needed for nausea. 07/12/20   [provider]  [facility] ULTRA test strip Check blood glucose THREE TIMES DAILY 03/31/21   [provider]  pantoprazole (PROTONIX) 40 MG tablet Take 1 tablet (40 mg total) by mouth 2 (two) times daily. 06/17/20   Barton Dubois, MD  ranolazine (RANEXA) 500 MG 12 hr tablet Take 1 tablet (500 mg total) by mouth at bedtime. 06/17/21   Domenic Polite, MD  Vitamin D, Ergocalciferol, (DRISDOL) 1.25 MG (50000 UNIT) CAPS capsule Take 50,000 Units by mouth every Sunday.  12/31/19   [provider]    Allergies    Penicillins and Beta adrenergic blockers  Review of Systems   Review of Systems  Constitutional:  Negative for appetite change and fatigue.  HENT:  Negative for congestion, ear discharge and sinus pressure.   Eyes:  Negative for discharge.  Respiratory:  Positive for shortness of breath. Negative for cough.   Cardiovascular:  Negative for chest pain.  Gastrointestinal:  Negative for abdominal pain and diarrhea.  Genitourinary:  Negative for frequency and hematuria.  Musculoskeletal:  Negative for back pain.  Skin:  Negative for rash.  Neurological:  Negative for seizures and headaches.  Psychiatric/Behavioral:  Negative for hallucinations.    Physical Exam Updated Vital Signs BP 132/72   Pulse 88   Temp 97.9 F (36.6 C) (Oral)   Resp (!) 23   Ht 5\' 5"  (1.651 m)   Wt 95.3 kg   SpO2 96%   BMI 34.95 kg/m   Physical Exam Vitals and nursing note reviewed.  Constitutional:      Appearance: She is well-developed.  HENT:      Head: Normocephalic.     Nose: Nose normal.  Eyes:     General: No scleral icterus.    Conjunctiva/sclera: Conjunctivae normal.  Neck:     Thyroid: No thyromegaly.  Cardiovascular:     Rate and Rhythm: Normal rate and regular rhythm.     Heart sounds: No murmur heard.   No friction rub. No gallop.  Pulmonary:     Breath sounds: No stridor. Wheezing present. No rales.  Chest:     Chest wall: No tenderness.  Abdominal:     General: There is no distension.     Tenderness: There is no abdominal tenderness. There is no rebound.  Musculoskeletal:        General: Normal range of motion.     Cervical back: Neck supple.  Lymphadenopathy:     Cervical: No cervical adenopathy.  Skin:    Findings: No erythema or rash.  Neurological:     Mental Status: She is alert and oriented to person, place, and time.     Motor: No abnormal muscle tone.     Coordination: Coordination normal.  Psychiatric:        Behavior: Behavior normal.    ED Results / Procedures / Treatments   Labs (all labs ordered are listed, but only abnormal results are displayed) Labs Reviewed  CBC - Abnormal; Notable for the following components:      Result Value   RBC 3.37 (*)    Hemoglobin 11.5 (*)    MCV 108.3 (*)    MCH 34.1 (*)    All other components  within normal limits  COMPREHENSIVE METABOLIC PANEL - Abnormal; Notable for the following components:   Glucose, Bld 210 (*)    BUN 32 (*)    Creatinine, Ser 5.62 (*)    GFR, Estimated 8 (*)    All other components within normal limits    EKG None  Radiology DG Chest 2 View  Result Date: 08/24/2021 CLINICAL DATA:  Shortness of breath EXAM: CHEST - 2 VIEW COMPARISON:  07/12/2021 FINDINGS: Post sternotomy changes. Right-sided central venous catheter with tip at the cavoatrial region. Cardiomegaly with vascular congestion and probable mild edema. No pleural effusion or pneumothorax. IMPRESSION: Cardiomegaly with vascular congestion and probable mild edema.  Electronically Signed   By: Donavan Foil M.D.   On: 08/24/2021 22:05    Procedures Procedures   Medications Ordered in ED Medications  ipratropium-albuterol (DUONEB) 0.5-2.5 (3) MG/3ML nebulizer solution 3 mL (3 mLs Nebulization Given 08/24/21 2210)  albuterol (PROVENTIL) (2.5 MG/3ML) 0.083% nebulizer solution 2.5 mg (2.5 mg Nebulization Given 08/24/21 2210)  methylPREDNISolone sodium succinate (SOLU-MEDROL) 125 mg/2 mL injection 125 mg (125 mg Intramuscular Given 08/24/21 2226)    ED Course  I have reviewed the triage vital signs and the nursing notes.  Pertinent labs & imaging results that were available during my care of the patient were reviewed by me and considered in my medical decision making (see chart for details).    MDM Rules/Calculators/A&P                           Patient with COPD and congestive heart failure with probable little fluid in the lungs.  Patient improved with neb treatments.  She will get dialysis tomorrow which should help also.  Patient is stable for discharge Final Clinical Impression(s) / ED Diagnoses Final diagnoses:  Dyspnea, unspecified type    Rx / DC Orders ED Discharge Orders     None        Milton Ferguson, MD 08/26/21 1144

## 2021-08-24 NOTE — ED Notes (Signed)
Patient transported to X-ray 

## 2021-08-24 NOTE — Discharge Instructions (Signed)
Make sure you get your dialysis tomorrow and use your nebulizers as prescribed.  Follow-up with your doctor next week for recheck

## 2021-08-25 ENCOUNTER — Other Ambulatory Visit: Payer: Self-pay

## 2021-08-25 ENCOUNTER — Emergency Department (HOSPITAL_COMMUNITY): Payer: Medicare HMO

## 2021-08-25 ENCOUNTER — Emergency Department (HOSPITAL_COMMUNITY)
Admission: EM | Admit: 2021-08-25 | Discharge: 2021-08-25 | Disposition: A | Discharge status: Home / Self Care | Payer: Medicare HMO | Source: Home / Self Care | Attending: Emergency Medicine | Admitting: Emergency Medicine

## 2021-08-25 ENCOUNTER — Encounter (HOSPITAL_COMMUNITY): Payer: Self-pay | Admitting: Emergency Medicine

## 2021-08-25 ENCOUNTER — Encounter (HOSPITAL_COMMUNITY): Payer: Self-pay | Admitting: Physician Assistant

## 2021-08-25 ENCOUNTER — Encounter (HOSPITAL_COMMUNITY): Payer: Self-pay | Admitting: Vascular Surgery

## 2021-08-25 DIAGNOSIS — I2089 Other forms of angina pectoris: Secondary | ICD-10-CM

## 2021-08-25 DIAGNOSIS — I132 Hypertensive heart and chronic kidney disease with heart failure and with stage 5 chronic kidney disease, or end stage renal disease: Secondary | ICD-10-CM | POA: Insufficient documentation

## 2021-08-25 DIAGNOSIS — R0789 Other chest pain: Secondary | ICD-10-CM | POA: Diagnosis not present

## 2021-08-25 DIAGNOSIS — Z743 Need for continuous supervision: Secondary | ICD-10-CM | POA: Diagnosis not present

## 2021-08-25 DIAGNOSIS — Z79899 Other long term (current) drug therapy: Secondary | ICD-10-CM | POA: Insufficient documentation

## 2021-08-25 DIAGNOSIS — R0602 Shortness of breath: Secondary | ICD-10-CM | POA: Diagnosis not present

## 2021-08-25 DIAGNOSIS — D509 Iron deficiency anemia, unspecified: Secondary | ICD-10-CM | POA: Diagnosis not present

## 2021-08-25 DIAGNOSIS — Z7901 Long term (current) use of anticoagulants: Secondary | ICD-10-CM | POA: Insufficient documentation

## 2021-08-25 DIAGNOSIS — N186 End stage renal disease: Secondary | ICD-10-CM | POA: Diagnosis not present

## 2021-08-25 DIAGNOSIS — Z992 Dependence on renal dialysis: Secondary | ICD-10-CM | POA: Diagnosis not present

## 2021-08-25 DIAGNOSIS — Z7982 Long term (current) use of aspirin: Secondary | ICD-10-CM | POA: Insufficient documentation

## 2021-08-25 DIAGNOSIS — E1140 Type 2 diabetes mellitus with diabetic neuropathy, unspecified: Secondary | ICD-10-CM | POA: Insufficient documentation

## 2021-08-25 DIAGNOSIS — N2581 Secondary hyperparathyroidism of renal origin: Secondary | ICD-10-CM | POA: Diagnosis not present

## 2021-08-25 DIAGNOSIS — R072 Precordial pain: Secondary | ICD-10-CM | POA: Insufficient documentation

## 2021-08-25 DIAGNOSIS — I517 Cardiomegaly: Secondary | ICD-10-CM | POA: Diagnosis not present

## 2021-08-25 DIAGNOSIS — I5032 Chronic diastolic (congestive) heart failure: Secondary | ICD-10-CM | POA: Insufficient documentation

## 2021-08-25 DIAGNOSIS — J449 Chronic obstructive pulmonary disease, unspecified: Secondary | ICD-10-CM | POA: Insufficient documentation

## 2021-08-25 DIAGNOSIS — E1122 Type 2 diabetes mellitus with diabetic chronic kidney disease: Secondary | ICD-10-CM | POA: Insufficient documentation

## 2021-08-25 DIAGNOSIS — J45909 Unspecified asthma, uncomplicated: Secondary | ICD-10-CM | POA: Insufficient documentation

## 2021-08-25 DIAGNOSIS — E039 Hypothyroidism, unspecified: Secondary | ICD-10-CM | POA: Insufficient documentation

## 2021-08-25 DIAGNOSIS — J811 Chronic pulmonary edema: Secondary | ICD-10-CM | POA: Diagnosis not present

## 2021-08-25 DIAGNOSIS — I120 Hypertensive chronic kidney disease with stage 5 chronic kidney disease or end stage renal disease: Secondary | ICD-10-CM | POA: Diagnosis not present

## 2021-08-25 DIAGNOSIS — R079 Chest pain, unspecified: Secondary | ICD-10-CM | POA: Diagnosis not present

## 2021-08-25 DIAGNOSIS — R739 Hyperglycemia, unspecified: Secondary | ICD-10-CM | POA: Diagnosis not present

## 2021-08-25 DIAGNOSIS — Z87891 Personal history of nicotine dependence: Secondary | ICD-10-CM | POA: Insufficient documentation

## 2021-08-25 DIAGNOSIS — I208 Other forms of angina pectoris: Secondary | ICD-10-CM

## 2021-08-25 DIAGNOSIS — I10 Essential (primary) hypertension: Secondary | ICD-10-CM | POA: Diagnosis not present

## 2021-08-25 DIAGNOSIS — Z20822 Contact with and (suspected) exposure to covid-19: Secondary | ICD-10-CM | POA: Insufficient documentation

## 2021-08-25 DIAGNOSIS — D689 Coagulation defect, unspecified: Secondary | ICD-10-CM | POA: Diagnosis not present

## 2021-08-25 DIAGNOSIS — I700 Atherosclerosis of aorta: Secondary | ICD-10-CM | POA: Diagnosis not present

## 2021-08-25 LAB — BASIC METABOLIC PANEL
Anion gap: 13 (ref 5–15)
BUN: 37 mg/dL — ABNORMAL HIGH (ref 8–23)
CO2: 26 mmol/L (ref 22–32)
Calcium: 10.2 mg/dL (ref 8.9–10.3)
Chloride: 96 mmol/L — ABNORMAL LOW (ref 98–111)
Creatinine, Ser: 6.16 mg/dL — ABNORMAL HIGH (ref 0.44–1.00)
GFR, Estimated: 7 mL/min — ABNORMAL LOW (ref >60)
Glucose, Bld: 509 mg/dL (ref 70–99)
Potassium: 5 mmol/L (ref 3.5–5.1)
Sodium: 135 mmol/L (ref 135–145)

## 2021-08-25 LAB — BRAIN NATRIURETIC PEPTIDE: B Natriuretic Peptide: 1086 pg/mL — ABNORMAL HIGH (ref 0.0–100.0)

## 2021-08-25 LAB — CBC WITH DIFFERENTIAL/PLATELET
Abs Immature Granulocytes: 0.09 10*3/uL — ABNORMAL HIGH (ref 0.00–0.07)
Basophils Absolute: 0 10*3/uL (ref 0.0–0.1)
Basophils Relative: 0 %
Eosinophils Absolute: 0 10*3/uL (ref 0.0–0.5)
Eosinophils Relative: 0 %
HCT: 37.7 % (ref 36.0–46.0)
Hemoglobin: 12 g/dL (ref 12.0–15.0)
Immature Granulocytes: 1 %
Lymphocytes Relative: 7 %
Lymphs Abs: 0.7 10*3/uL (ref 0.7–4.0)
MCH: 34.5 pg — ABNORMAL HIGH (ref 26.0–34.0)
MCHC: 31.8 g/dL (ref 30.0–36.0)
MCV: 108.3 fL — ABNORMAL HIGH (ref 80.0–100.0)
Monocytes Absolute: 0.1 10*3/uL (ref 0.1–1.0)
Monocytes Relative: 1 %
Neutro Abs: 8.1 10*3/uL — ABNORMAL HIGH (ref 1.7–7.7)
Neutrophils Relative %: 91 %
Platelets: 158 10*3/uL (ref 150–400)
RBC: 3.48 MIL/uL — ABNORMAL LOW (ref 3.87–5.11)
RDW: 14.3 % (ref 11.5–15.5)
WBC: 9 10*3/uL (ref 4.0–10.5)
nRBC: 0 % (ref 0.0–0.2)

## 2021-08-25 LAB — RESP PANEL BY RT-PCR (FLU A&B, COVID) ARPGX2
Influenza A by PCR: NEGATIVE
Influenza B by PCR: NEGATIVE
SARS Coronavirus 2 by RT PCR: NEGATIVE

## 2021-08-25 LAB — D-DIMER, QUANTITATIVE: D-Dimer, Quant: 0.49 ug/mL-FEU (ref 0.00–0.50)

## 2021-08-25 LAB — CBG MONITORING, ED: Glucose-Capillary: 469 mg/dL — ABNORMAL HIGH (ref 70–99)

## 2021-08-25 LAB — HEPATITIS B SURFACE ANTIGEN: Hepatitis B Surface Ag: NONREACTIVE

## 2021-08-25 LAB — TROPONIN I (HIGH SENSITIVITY)
Troponin I (High Sensitivity): 73 ng/L — ABNORMAL HIGH (ref <18)
Troponin I (High Sensitivity): 95 ng/L — ABNORMAL HIGH (ref <18)

## 2021-08-25 MED ORDER — INSULIN ASPART PROT & ASPART (70-30 MIX) 100 UNIT/ML ~~LOC~~ SUSP
9.0000 [IU] | Freq: Once | SUBCUTANEOUS | Status: DC
  Filled 2021-08-25: qty 10

## 2021-08-25 MED ORDER — INSULIN ASPART PROT & ASPART (70-30 MIX) 100 UNIT/ML ~~LOC~~ SUSP: 15.0000 [IU] | Freq: Once | SUBCUTANEOUS | Status: DC

## 2021-08-25 MED ORDER — ASPIRIN 81 MG PO CHEW
324.0000 mg | CHEWABLE_TABLET | Freq: Once | ORAL | Status: AC
  Administered 2021-08-25: 324 mg via ORAL
  Filled 2021-08-25: qty 4

## 2021-08-25 MED ORDER — NITROGLYCERIN 0.4 MG SL SUBL
0.4000 mg | SUBLINGUAL_TABLET | SUBLINGUAL | Status: DC | PRN
  Filled 2021-08-25: qty 1

## 2021-08-25 MED ORDER — NITROGLYCERIN 2 % TD OINT
1.0000 [in_us] | TOPICAL_OINTMENT | Freq: Once | TRANSDERMAL | Status: AC
  Administered 2021-08-25: 1 [in_us] via TOPICAL
  Filled 2021-08-25: qty 1

## 2021-08-25 MED ORDER — IPRATROPIUM-ALBUTEROL 0.5-2.5 (3) MG/3ML IN SOLN
3.0000 mL | Freq: Once | RESPIRATORY_TRACT | Status: AC
  Administered 2021-08-25: 3 mL via RESPIRATORY_TRACT
  Filled 2021-08-25: qty 3

## 2021-08-25 MED ORDER — INSULIN ASPART 100 UNIT/ML IJ SOLN
10.0000 [IU] | Freq: Once | INTRAMUSCULAR | Status: AC
  Administered 2021-08-25: 10 [IU] via SUBCUTANEOUS
  Filled 2021-08-25: qty 1

## 2021-08-25 NOTE — ED Triage Notes (Signed)
Pt reports sob that started yesterday, reports productive cough, pt breathing is WNL, no distress, can speak full sentences. No fever. Reports pain in back also. Pt was seen here 8 hours ago for the same and discharged.

## 2021-08-25 NOTE — Progress Notes (Signed)
PCP - Dr. Pleas Koch Cardiologist - Dr. Domenic Polite EKG - 07/12/21 Chest x-ray - 07/12/21 ECHO - 06/15/21 Cardiac Cath - 06/14/21 CPAP -   Fasting Blood Sugar:  110-150 Checks Blood Sugar:  3x/day  Blood Thinner Instructions: Follow your surgeon's instructions on when to stop Aspirin and Plavix.  If no instructions were given by your surgeon then you will need to call the office to get those instructions.    Aspirin Instructions:   ERAS Protcol -   COVID TEST- n/a  Anesthesia review: yes  -------------  SDW INSTRUCTIONS:  Your procedure is scheduled on Friday 12/9 . Please report to [facility] Main Entrance "A" at [email or web address] and check in at the Admitting office. Call this number if you have problems the morning of surgery: [phone]   Remember: Do not eat or drink after midnight the night before your surgery   Medications to take morning of surgery with a sip of water include: Xanax (if needed) Inhaler --- Please bring all inhalers with you the day of surgery.  Synthroid Protonix  Midodrine  As of today, STOP taking any Aleve, Naproxen, Ibuprofen, Motrin, Advil, Goody's, BC's, all herbal medications, fish oil, and all vitamins.  ** PLEASE check your blood sugar the morning of your surgery when you wake up and every 2 hours until you get to the Short Stay unit.  If your blood sugar is less than 70 mg/dL, you will need to treat for low blood sugar: Do not take insulin. Treat a low blood sugar (less than 70 mg/dL) with  cup of clear juice (cranberry or apple), 4 glucose tablets, OR glucose gel. Recheck blood sugar in 15 minutes after treatment (to make sure it is greater than 70 mg/dL). If your blood sugar is not greater than 70 mg/dL on recheck, call [phone] for further instructions. Novolin 70/30 - take 70% tonight 12/8, NONE DOS   The Morning of Surgery Do not wear jewelry, make-up or nail polish. Do not wear lotions, powders, or perfumes  Do not bring valuables to  the hospital. [facility] is not responsible for any belongings or valuables.  If you are a smoker, DO NOT Smoke 24 hours prior to surgery  If you wear a CPAP at night please bring your mask the morning of surgery   Remember that you must have someone to transport you home after your surgery, and remain with you for 24 hours if you are discharged the same day.  Please bring cases for contacts, glasses, hearing aids, dentures or bridgework because it cannot be worn into surgery.   Patients discharged the day of surgery will not be allowed to drive home.   Please shower the NIGHT BEFORE/MORNING OF SURGERY (use antibacterial soap like DIAL soap if possible). Wear comfortable clothes the morning of surgery. Oral Hygiene is also important to reduce your risk of infection.  Remember - BRUSH YOUR TEETH THE MORNING OF SURGERY WITH YOUR REGULAR TOOTHPASTE  Patient denies shortness of breath, fever, cough and chest pain.

## 2021-08-25 NOTE — ED Triage Notes (Signed)
Pt arrives [facility] c/o SOB. Pt took 3 nitro prior to EMS arrival for burning sensation/tightness in mid-chest.  EMS CBG: 476 BP: 163/88 98% 4 L 

## 2021-08-25 NOTE — ED Provider Notes (Signed)
[facility] Provider Note   CSN: [id number] Arrival date & time: 08/25/21  0600     History Chief Complaint  Patient presents with   Shortness of Breath    Donna Howe is a 65 y.o. female.  The history is provided by the patient.  Shortness of Breath She has history of hypertension, diabetes, hyperlipidemia, coronary artery disease, end-stage renal disease on hemodialysis comes in with ongoing problems with shortness of breath.  She was in the emergency department yesterday and states that she was still having significant dyspnea when she was discharged.  Shortness of breath started 2 days ago and has been getting progressively worse.  She is scheduled for her dialysis today.  She is also complaining of chest pain in the midsternal area radiating to the back which she states is typical of her coronary disease and she is requesting nitroglycerin.  Of note, she did take nitroglycerin at home, as well has giving herself a nebulizer treatment.  Pain is rated at 10/10.  There has been a minimal cough, no fever or chills.  She denies nausea or diaphoresis.   Past Medical History:  Diagnosis Date   Anemia    Anxiety    Asthma    CAD (coronary artery disease)    Multivessel s/p CABG 2005, numerous PCIs since that time and documented graft disease   Carotid artery disease ([place])    R CEA   ESRD on hemodialysis ([place])    Essential hypertension    Gout    History of blood transfusion    Hyperlipidemia    Hypothyroidism    Myocardial infarction [facility])    PAD (peripheral artery disease) ([place])    Dr. Kellie Simmering   Pneumonia 09/2019, 11/2019   S/P angioplasty with stent- DES to mRCA and to LIMA to LAD with DES 04/09/18.   04/10/2018   SBO (small bowel obstruction) ([place]) 2011   Status post lysis of adhesions & hernia repair   Sinus bradycardia    Type 2 diabetes mellitus ([place])    Umbilical hernia     Patient Active Problem List   Diagnosis Date Noted   COPD (chronic  obstructive pulmonary disease) ([place]) 07/13/2021   Insulin dependent type 2 diabetes mellitus ([place])    Acute respiratory failure ([place]) 07/12/2021   Nausea 07/02/2021   Pulmonary edema 06/14/2021   Other fluid overload 03/01/2021   Stenosis of left subclavian artery (HCC)    Non-ST elevation (NSTEMI) myocardial infarction ([place]) 01/12/2021   Leukocytosis 01/09/2021   Elevated MCV 01/09/2021   Diabetic neuropathy ([place]) 01/09/2021   COVID-19 09/30/2020   Unspecified protein-calorie malnutrition ([place]) 07/05/2020   Allergy, unspecified, initial encounter 06/30/2020   Anaphylactic shock, unspecified, initial encounter 06/30/2020   Anemia in chronic kidney disease 06/30/2020   Coagulation defect, unspecified ([place]) 06/30/2020   Diarrhea, unspecified 06/30/2020   Encounter for immunization 06/30/2020   Iron deficiency anemia, unspecified 06/30/2020   Pain, unspecified 06/30/2020   PAD (peripheral artery disease) ([place]) 06/30/2020   Pruritus, unspecified 06/30/2020   Secondary hyperparathyroidism of renal origin ([place]) 06/30/2020   ESRD (end stage renal disease) (HCC)    SOB (shortness of breath)    Chronic diastolic HF (heart failure) (HCC)    GERD (gastroesophageal reflux disease)    Acute renal failure superimposed on stage 5 chronic kidney disease, not on chronic dialysis ([place]) 06/13/2020   Hyperglycemia due to diabetes mellitus ([place]) 06/13/2020   Pneumonia 12/17/2019   Cough variant asthma with component of  UACS 11/20/2019   Elevated troponin I level 10/02/2019   Chest pain 10/01/2019   Unstable angina ([place]) 04/18/2018   Hypothyroidism (acquired) 04/18/2018   S/P angioplasty with stent- DES to [facility] and to LIMA to LAD with DES 04/09/18.   04/10/2018   Angina pectoris ([place]) 04/05/2018   Status post coronary artery stent placement    Acute coronary syndrome ([place]) 05/31/2017   Acute chest pain    History of coronary artery disease    Diabetes mellitus with ESRD (end-stage renal disease)  ([place]) 10/28/2013   Hypoglycemia associated with diabetes ([place]) 10/28/2013   Thrombocytopenia, unspecified ([place]) 10/28/2013   Hypokalemia 10/27/2013   Syncope 10/26/2013   Anemia 10/26/2013   AKI (acute kidney injury) ([place]) 10/26/2013   Fracture of toe of right foot 63/09/6008   Umbilical hernia    Carotid artery disease ([place])    Occlusion and stenosis of carotid artery without mention of cerebral infarction 04/15/2013   Hx of CABG    Ejection fraction    Atherosclerosis of native artery of extremity with intermittent claudication ([place]) 02/11/2013   Diabetes mellitus with renal manifestation ([place]) 01/21/2013   Bradycardia 01/03/2013   Chronic kidney disease (CKD), stage III (moderate) (HCC)    Gout    PROTEINURIA, MILD 01/18/2010   Obesity (BMI 30-39.9) 08/26/2009   Asthma, chronic, unspecified asthma severity, with acute exacerbation 08/26/2009   Mixed hyperlipidemia 03/31/2007   Essential hypertension 03/31/2007   CAD (coronary artery disease) 03/31/2007    Past Surgical History:  Procedure Laterality Date   AORTIC ARCH ANGIOGRAPHY N/A 08/02/2020   Procedure: AORTIC ARCH ANGIOGRAPHY;  Surgeon: Waynetta Sandy, MD;  Location: [place] [facility];  Service: Cardiovascular;  Laterality: N/A;  Lt upper extermity   AV FISTULA PLACEMENT Left 06/29/2020   Procedure: LEFT ARM ARTERIOVENOUS (AV) FISTULA;  Surgeon: Waynetta Sandy, MD;  Location: [facility];  Service: Vascular;  Laterality: Left;  ARM   AV FISTULA PLACEMENT Right 05/11/2021   Procedure: RIGHT BRACHIOCEPHALIC ARTERIOVENOUS (AV) FISTULA CREATION;  Surgeon: Waynetta Sandy, MD;  Location: [facility] OR;  Service: Vascular;  Laterality: Right;   [place]  2010   CORONARY ARTERY BYPASS GRAFT  2005   CORONARY BALLOON ANGIOPLASTY N/A 05/31/2017   Procedure: CORONARY BALLOON ANGIOPLASTY;  Surgeon: Jettie Booze, MD;  Location: [place] [facility];  Service: Cardiovascular;   Laterality: N/A;   CORONARY BALLOON ANGIOPLASTY N/A 06/14/2021   Procedure: CORONARY BALLOON ANGIOPLASTY;  Surgeon: Jettie Booze, MD;  Location: [place] [facility];  Service: Cardiovascular;  Laterality: N/A;   CORONARY STENT INTERVENTION N/A 05/31/2017   Procedure: CORONARY STENT INTERVENTION;  Surgeon: Jettie Booze, MD;  Location: [place] [facility] LAB;  Service: Cardiovascular;  Laterality: N/A;   CORONARY STENT INTERVENTION N/A 04/09/2018   Procedure: CORONARY STENT INTERVENTION;  Surgeon: Jettie Booze, MD;  Location: [place] [facility] LAB;  Service: Cardiovascular;  Laterality: N/A;  SVG RCA   CORONARY STENT INTERVENTION N/A 01/10/2021   Procedure: CORONARY STENT INTERVENTION;  Surgeon: Leonie Man, MD;  Location: [place] [facility];  Service: Cardiovascular;  Laterality: N/A;   CORONARY/GRAFT ACUTE MI REVASCULARIZATION N/A 02/24/2021   Procedure: Coronary/Graft Acute MI Revascularization;  Surgeon: Jettie Booze, MD;  Location: [place] [facility];  Service: Cardiovascular;  Laterality: N/A;   ENDARTERECTOMY Right 04/18/2013   Procedure: ENDARTERECTOMY CAROTID;  Surgeon: Mal Misty, MD;  Location: [place];  Service: Vascular;  Laterality: Right;  HERNIA REPAIR  1989   Incisional hernia repair x2  03/04/2010   Laparoscopic with 35cm mesh by Dr Ronnald Collum   IR [place] CV LINE RIGHT  06/21/2020   IR US GUIDE VASC ACCESS RIGHT  06/21/2020   LEFT HEART CATH AND CORS/GRAFTS ANGIOGRAPHY N/A 05/31/2017   Procedure: LEFT HEART CATH AND CORS/GRAFTS ANGIOGRAPHY;  Surgeon: Jettie Booze, MD;  Location: [place] [facility];  Service: Cardiovascular;  Laterality: N/A;   LEFT HEART CATH AND CORS/GRAFTS ANGIOGRAPHY N/A 04/08/2018   Procedure: LEFT HEART CATH AND CORS/GRAFTS ANGIOGRAPHY;  Surgeon: Jettie Booze, MD;  Location: [place] [facility];  Service: Cardiovascular;  Laterality: N/A;   LEFT HEART CATH AND CORS/GRAFTS ANGIOGRAPHY N/A 06/22/2020   Procedure: LEFT  HEART CATH AND CORS/GRAFTS ANGIOGRAPHY;  Surgeon: Belva Crome, MD;  Location: [place] [facility];  Service: Cardiovascular;  Laterality: N/A;   LEFT HEART CATH AND CORS/GRAFTS ANGIOGRAPHY N/A 01/10/2021   Procedure: LEFT HEART CATH AND CORS/GRAFTS ANGIOGRAPHY;  Surgeon: Leonie Man, MD;  Location: [place] [facility];  Service: Cardiovascular;  Laterality: N/A;   LEFT HEART CATH AND CORS/GRAFTS ANGIOGRAPHY N/A 02/24/2021   Procedure: LEFT HEART CATH AND CORS/GRAFTS ANGIOGRAPHY;  Surgeon: Jettie Booze, MD;  Location: [place] [facility];  Service: Cardiovascular;  Laterality: N/A;   LEFT HEART CATH AND CORS/GRAFTS ANGIOGRAPHY N/A 06/14/2021   Procedure: LEFT HEART CATH AND CORS/GRAFTS ANGIOGRAPHY;  Surgeon: Jettie Booze, MD;  Location: [place] [facility];  Service: Cardiovascular;  Laterality: N/A;   LEFT HEART CATHETERIZATION WITH CORONARY ANGIOGRAM N/A 12/19/2012   Procedure: LEFT HEART CATHETERIZATION WITH CORONARY ANGIOGRAM;  Surgeon: Josue Hector, MD;  Location: [facility] CATH LAB;  Service: Cardiovascular;  Laterality: N/A;   LEFT HEART CATHETERIZATION WITH CORONARY/GRAFT ANGIOGRAM N/A 04/19/2013   Procedure: LEFT HEART CATHETERIZATION WITH Beatrix Fetters;  Surgeon: Lorretta Harp, MD;  Location: [facility] CATH LAB;  Service: Cardiovascular;  Laterality: N/A;   LIGATION OF ARTERIOVENOUS  FISTULA Left 09/15/2020   Procedure: LIGATION OF LEFT ARM ARTERIOVENOUS  FISTULA;  Surgeon: Waynetta Sandy, MD;  Location: [facility];  Service: Vascular;  Laterality: Left;   PATCH ANGIOPLASTY Right 04/18/2013   Procedure: PATCH ANGIOPLASTY Right Internal Carotid Artery;  Surgeon: Mal Misty, MD;  Location: [place];  Service: Vascular;  Laterality: Right;   PERCUTANEOUS CORONARY STENT INTERVENTION (PCI-S) Right 12/19/2012   Procedure: PERCUTANEOUS CORONARY STENT INTERVENTION (PCI-S);  Surgeon: Josue Hector, MD;  Location: [facility] CATH LAB;  Service: Cardiovascular;  Laterality: Right;    PERIPHERAL VASCULAR INTERVENTION Left 08/02/2020   Procedure: PERIPHERAL VASCULAR INTERVENTION;  Surgeon: Waynetta Sandy, MD;  Location: [place] [facility];  Service: Cardiovascular;  Laterality: Left;  Left subclavian   PERIPHERAL VASCULAR INTERVENTION  02/24/2021   Procedure: PERIPHERAL VASCULAR INTERVENTION;  Surgeon: Marty Heck, MD;  Location: [place] [facility];  Service: Vascular;;   SHOULDER SURGERY       OB History   No obstetric history on file.     Family History  Problem Relation Age of Onset   Diabetes Mother    Heart disease Mother        before age 71   Hyperlipidemia Mother    Hypertension Mother    Thyroid disease Father    Hypertension Father    AAA (abdominal aortic aneurysm) Father    Heart disease Brother        before age 10   Hypertension Brother  Hyperlipidemia Son    Hypertension Son     Social History   Tobacco Use   Smoking status: Former    Packs/day: 1.00    Years: 20.00    Pack years: 20.00    Types: Cigarettes    Quit date: 12/10/2012    Years since quitting: 8.7   Smokeless tobacco: Never   Tobacco comments:    Not currently smoking   Vaping Use   Vaping Use: Never used  Substance Use Topics   Alcohol use: No    Alcohol/week: 0.0 standard drinks   Drug use: No    Home Medications Prior to Admission medications   Medication Sig Start Date End Date Taking? Authorizing Provider  albuterol (PROVENTIL) (2.5 MG/3ML) 0.083% nebulizer solution Take 3 mLs (2.5 mg total) by nebulization every 2 (two) hours as needed for wheezing. 07/15/21   Kathie Dike, MD  Albuterol Sulfate 108 (90 Base) MCG/ACT AEPB Inhale 2 puffs into the lungs every 6 (six) hours as needed (Shortness of breath).    [provider]  allopurinol (ZYLOPRIM) 100 MG tablet Take one tablet after hemodialysis, on Tuesday, Thursday and Saturday. Patient taking differently: Take 100 mg by mouth Every Tuesday,Thursday,and Saturday with dialysis.  After dialysis 06/30/20   Arrien, Jimmy Picket, MD  ALPRAZolam Duanne Moron) 0.5 MG tablet Take 0.25-0.5 mg by mouth See admin instructions. Take 0.25 in the morning and evening and 0.5 mg at bedtime    [provider]  aspirin EC 81 MG tablet Take 81 mg by mouth daily.     [provider]  atorvastatin (LIPITOR) 80 MG tablet Take 1 tablet (80 mg total) by mouth every evening. Patient taking differently: Take 80 mg by mouth at bedtime. 12/19/19   Johnson, Clanford L, MD  budesonide-formoterol (SYMBICORT) 80-4.5 MCG/ACT inhaler Take 2 puffs first thing in am and then another 2 puffs about 12 hours later. 07/22/21   Tanda Rockers, MD  clopidogrel (PLAVIX) 75 MG tablet Take 1 tablet (75 mg total) by mouth daily. 08/27/18   Satira Sark, MD  cyclobenzaprine (FLEXERIL) 5 MG tablet Take 1 tablet (5 mg total) by mouth 2 (two) times daily with a meal. Patient taking differently: Take 5 mg by mouth 3 (three) times daily as needed for muscle spasms. 06/17/21   Domenic Polite, MD  diclofenac Sodium (VOLTAREN) 1 % GEL Apply 2 g topically daily as needed (Pain). 06/24/21   [provider]  ezetimibe (ZETIA) 10 MG tablet Take 1 tablet (10 mg total) by mouth daily. Patient taking differently: Take 10 mg by mouth at bedtime. 02/25/21   Bhagat, Crista Luria, PA  ferric citrate (AURYXIA) 1 GM 210 MG(Fe) tablet Take 420 mg by mouth 3 (three) times daily with meals.    [provider]  gabapentin (NEURONTIN) 400 MG capsule Take 1 capsule (400 mg total) by mouth at bedtime. 06/17/21   Domenic Polite, MD  ipratropium (ATROVENT HFA) 17 MCG/ACT inhaler Inhale 2 puffs into the lungs every 4 (four) hours as needed for wheezing.     [provider]  levothyroxine (SYNTHROID) 125 MCG tablet Take 125 mcg by mouth daily before breakfast. 07/25/21   [provider]  Methoxy PEG-Epoetin Beta (MIRCERA IJ) Mircera 07/30/21 07/29/22  [provider]  midodrine (PROAMATINE)  10 MG tablet Take 1 tablet (10 mg total) by mouth 2 (two) times daily with a meal. 06/17/21   Domenic Polite, MD  multivitamin (RENA-VIT) TABS tablet Take 1 tablet by mouth at bedtime.  [provider]  nitroGLYCERIN (NITROSTAT) 0.4 MG SL tablet Place 1 tablet (0.4 mg total) under the tongue every 5 (five) minutes x 3 doses as needed for chest pain. 02/25/21   Bhagat, Bhavinkumar, PA  NOVOLIN 70/30 KWIKPEN (70-30) 100 UNIT/ML KwikPen Inject 35 Units into the skin in the morning and at bedtime. Patient taking differently: Inject 24 Units into the skin in the morning and at bedtime. 07/15/21   Kathie Dike, MD  ondansetron (ZOFRAN) 8 MG tablet Take 8 mg by mouth every 8 (eight) hours as needed for nausea. 07/12/20   [provider]  [facility] ULTRA test strip Check blood glucose THREE TIMES DAILY 03/31/21   [provider]  pantoprazole (PROTONIX) 40 MG tablet Take 1 tablet (40 mg total) by mouth 2 (two) times daily. 06/17/20   Barton Dubois, MD  ranolazine (RANEXA) 500 MG 12 hr tablet Take 1 tablet (500 mg total) by mouth at bedtime. 06/17/21   Domenic Polite, MD  Vitamin D, Ergocalciferol, (DRISDOL) 1.25 MG (50000 UNIT) CAPS capsule Take 50,000 Units by mouth every Sunday.  12/31/19   [provider]    Allergies    Penicillins and Beta adrenergic blockers  Review of Systems   Review of Systems  Respiratory:  Positive for shortness of breath.   All other systems reviewed and are negative.  Physical Exam Updated Vital Signs BP (!) 151/90 (BP Location: Left Arm)   Pulse (!) 102   Temp 97.7 F (36.5 C) (Oral)   Resp (!) 28   Ht 5\' 5"  (1.651 m)   Wt 95.3 kg   SpO2 98%   BMI 34.96 kg/m   Physical Exam Vitals and nursing note reviewed.  65 year old female, appears mildly uncomfortable, but is no acute distress. Vital signs are significant for elevated respiratory rate, blood pressure and borderline elevated heart rate. Oxygen saturation is 98%, which  is normal. Head is normocephalic and atraumatic. PERRLA, EOMI. Oropharynx is clear. Neck is nontender and supple without adenopathy or JVD. Back is nontender and there is no CVA tenderness. Lungs have distant breath sounds, few rales present on the right.  No wheezes or rhonchi are appreciated. Chest is nontender.  Dialysis access catheters present right subclavian area. Heart has regular rate and rhythm without murmur. Abdomen is soft, flat, nontender. Extremities have 2+ pretibial edema, full range of motion is present. Skin is warm and dry without rash. Neurologic: Mental status is normal, cranial nerves are intact, moves all extremities equally.  ED Results / Procedures / Treatments   Labs (all labs ordered are listed, but only abnormal results are displayed) Labs Reviewed  RESP PANEL BY RT-PCR (FLU A&B, COVID) ARPGX2  BRAIN NATRIURETIC PEPTIDE  BASIC METABOLIC PANEL  CBC WITH DIFFERENTIAL/PLATELET  D-DIMER, QUANTITATIVE  TROPONIN I (HIGH SENSITIVITY)    EKG EKG Interpretation  Date/Time:  Thursday August 25 2021 06:07:08 EST Ventricular Rate:  104 PR Interval:  137 QRS Duration: 114 QT Interval:  380 QTC Calculation: 500 R Axis:   -25 Text Interpretation: Sinus tachycardia Borderline intraventricular conduction delay Abnormal R-wave progression, late transition Repol abnrm, severe global ischemia (LM/MVD) When compared with ECG of 08/24/2021, No significant change was found Confirmed by Delora Fuel ([id number]) on 08/25/2021 7:03:01 AM  Radiology DG Chest 2 View  Result Date: 08/24/2021 CLINICAL DATA:  Shortness of breath EXAM: CHEST - 2 VIEW COMPARISON:  07/12/2021 FINDINGS: Post sternotomy changes. Right-sided central venous catheter with tip at the cavoatrial region. Cardiomegaly with vascular congestion  and probable mild edema. No pleural effusion or pneumothorax. IMPRESSION: Cardiomegaly with vascular congestion and probable mild edema. Electronically Signed   By: Donavan Foil M.D.   On: 08/24/2021 22:05    Procedures Procedures   Medications Ordered in ED Medications  aspirin chewable tablet 324 mg (has no administration in time range)  nitroGLYCERIN (NITROSTAT) SL tablet 0.4 mg (has no administration in time range)  nitroGLYCERIN (NITROGLYN) 2 % ointment 1 inch (has no administration in time range)    ED Course  I have reviewed the triage vital signs and the nursing notes.  Pertinent labs & imaging results that were available during my care of the patient were reviewed by me and considered in my medical decision making (see chart for details).   MDM Rules/Calculators/A&P                         Dyspnea and patient with known history of heart failure and COPD as well as renal insufficiency.  Old records are reviewed, and chest x-ray from yesterday's ED visit did show some suggestion of pulmonary edema.  BNP and troponin were not checked yesterday, will check today.  Also, given combination of chest pain and dyspnea, will screen with D-dimer to rule out pulmonary embolism.  She was given steroids and magnesium yesterday, no need to repeat them at this time.  She will be given nebulizer treatments while waiting for x-ray and labs.    ECG is unchanged from prior.  Chest x-ray and labs are still pending.  Case is signed out to Dr. Pearline Cables.  Final Clinical Impression(s) / ED Diagnoses Final diagnoses:  Shortness of breath  Precordial pain  End-stage renal disease on hemodialysis [facility])    Rx / DC Orders ED Discharge Orders     None        Delora Fuel, MD 92/11/94 0730

## 2021-08-25 NOTE — Anesthesia Preprocedure Evaluation (Deleted)
Anesthesia Evaluation    Airway        Dental   Pulmonary former smoker,           Cardiovascular hypertension,      Neuro/Psych    GI/Hepatic   Endo/Other  diabetes  Renal/GU      Musculoskeletal   Abdominal   Peds  Hematology   Anesthesia Other Findings   Reproductive/Obstetrics                             Anesthesia Physical Anesthesia Plan  ASA:   Anesthesia Plan:    Post-op Pain Management:    Induction:   PONV Risk Score and Plan:   Airway Management Planned:   Additional Equipment:   Intra-op Plan:   Post-operative Plan:   Informed Consent:   Plan Discussed with:   Anesthesia Plan Comments: (PAT note by Karoline Caldwell, PA-C: Follows with cardiology for history of HLD, HTN, chronic angina, CADs/p CABG in 2005 with multiple PCI's sincewith the most recent being anNSTEMI in 12/2020 with DESx2 to SVG-RCAand recent NSTEMI in 02/2021 and found to have75% stenosis along the SVG to PDA whichwas treated with scoring balloon angioplasty and also received DES placement to80% stenosis along the mid graft lesion.Also required intervention to her subclavian stent as it was felt this could be compromising flow into the LIMA.Patient was again hospitalized 06/13/2021 through 06/17/2021 with NSTEMI. Cardiology consulted, underwent left heart cath, noted to have severe three-vessel CAD, culprit lesion was suspected to be severe in-stent restenosis and SVG to RCA, this was treated with balloon angioplasty9/27.  Last outpatient cardiology appointment was with Levell July, NP on 07/01/2021.  Per note, "Status post NSTEMI with scoring balloon angioplasty to proximal SVG RCA lesion with 0% stenosis status postintervention. Currently has some mild occasional left chest pain which she states she is unable to discern if it is coming from her significant GERD or her heart disease. She states she  is unable to take her Ranexa very often except for days not scheduled for dialysis due to causing hypotension. Right groin catheter access site with no redness, swelling, drainage or pain. Good pulses. Continue Ranexa 500 mg p.o. twice daily. Continue sublingual nitroglycerin as needed, continue aspirin 81 mg daily, continue Plavix 75 mg p.o. daily."  She was advised to follow-up with cardiology in 6 months.  She was admitted 07/12/2021 through 07/15/2021 for acute exacerbation of asthma/COPD.  She was treated with IV steroids, bronchodilators, antibiotics.  CT showed evidence of pneumonia.  Post discharge she followed up with pulmonologist Dr. Melvyn Novas on 07/22/2021.  Per note, "her baseline lung function is minimally abnormal and does not fit criteria for COPD so better defined as AB requiring intermittent Symbicort."  She was coached on inhaler technique.  She was advised to follow-up with pulmonology on an as-needed basis.  Patient was seen in ED 08/24/2021 and 08/25/2021 with complaint of shortness of breath and chest pain.  Her symptoms were felt largely due to fluid overload.  Troponins were mildly elevated; felt likely to be troponin leak secondary to ESRD.  Per ED MD note, "She has history of ongoing anginal chest pain, favor stable angina as etiology of symptoms today. The patient improved significantly and was discharged in stable condition. Detailed discussions were had with the patient regarding current findings, and need for close f/u with PCP or on call doctor. The patient has been instructed to return immediately if the symptoms worsen  in any way for re-evaluation. Patient verbalized understanding and is in agreement with current care plan. All questions answered prior to discharge."  Patient was discussed with nephrology and she was arranged to receive dialysis outpatient later that day.  Uncontrolled IDDM 2, appears CBG was 469 prior to discharge from ED on 08/25/2021. Last A1c 7.2 on  06/14/21.  Chronically low blood pressure, maintained on midodrine.  ESRD via right IJ [facility] on Tuesday Thursday Saturday.  She will need DOS labs and evaluation.  EKG 08/25/21: Sinus tachycardia. Rate 104. Borderline intraventricular conduction delay. Abnormal R-wave progression, late transition. Repol abnrm, severe global ischemia (LM/MVD) EKG 08/24/21: Sinus tachycardia. Rate 101. Nonspecific intraventricular conduction delay. Nonspecific repol abnormality, diffuse leads EKG 07/14/21: Normal sinus rhythm. Rate 84. Left ventricular hypertrophy with repolarization abnormality ( Cornell product )  TTE 06/15/21: 1. Poor endocardial visualization even with contrast Appears to be some  inferior basal wall hypokineis on contrast images . Left ventricular  ejection fraction, by estimation, is 50 to 55%. The left ventricle has low  normal function. The left ventricle  demonstrates regional wall motion abnormalities (see scoring  diagram/findings for description). There is moderate left ventricular  hypertrophy. Left ventricular diastolic parameters are consistent with  Grade II diastolic dysfunction  (pseudonormalization). Elevated left ventricular end-diastolic pressure.  2. Right ventricular systolic function is normal. The right ventricular  size is normal.  3. Left atrial size was mildly dilated.  4. The mitral valve is abnormal. Trivial mitral valve regurgitation. No  evidence of mitral stenosis.  5. The aortic valve is tricuspid. Aortic valve regurgitation is not  visualized. Mild to moderate aortic valve sclerosis/calcification is  present, without any evidence of aortic stenosis.  6. The inferior vena cava is normal in size with greater than 50%  respiratory variability, suggesting right atrial pressure of 3 mmHg.   Cath and PCI 06/14/21: . Ost LM to Dist LM lesion is 95% stenosed-severe restenosis . Prox LAD to Mid LAD lesion is 100% stenosed. Colon Flattery LAD to Prox LAD lesion is  100% stenosed. LIMA to LAD is patent. Ostial LIMA stent with 30% in-stent restenosis. Colon Flattery Cx to Mid Cx lesion is 99% stenosed. SVG to OM/LPDA is occluded. . 1st Mrg-1 lesion is 99% stenosed. . 1st Mrg-2 lesion is 100% stenosed. . Origin to Prox Graft lesion is 30% stenosed. . Prox RCA lesion is 100% stenosed. Severe in-stent restenosis in the proximal graft. . Prox Graft-1 lesion is 90% stenosed. Marland Kitchen Scoring balloon angioplasty was performed using a 3.5 mm core flex balloon. This was followed with angioplasty by [place] [id number]. Marland Kitchen Post intervention, there is a 0% residual stenosis. Marland Kitchen LIMA and is normal in caliber. . LV end diastolic pressure is mildly elevated. . There is no aortic valve stenosis.  Severe three-vessel coronary artery disease. Culprit for this presentation was likely the severe in-stent restenosis in the SVG to RCA. Known in-stent restenosis of prior left main stent. Patent ostial LIMA stent with only 30% in-stent restenosis.   Mild volume overload. The patient is due for dialysis. )        Anesthesia Quick Evaluation

## 2021-08-25 NOTE — ED Notes (Signed)
Pt O@ sats 89%. Placed pt on 2L [place]: O2 sats 95%

## 2021-08-25 NOTE — ED Provider Notes (Signed)
.   Provider Note MRN:  [id number]  Arrival date & time: 08/25/21    ED Course and Medical Decision Making  Assumed care from Dr Roxanne Mins at shift change.  See not from prior team for complete details, in brief: pt with ongoing chest pain and dib, was seen yesterday for similar complaint. Pt given neb treatment. Favor some component of fluid overload as etiology of her complaints  Plan per prior physician follow up labs, likely discharge home to f/u with HD   Pt reassessed, reports that she is feeling better.  She has not had recurrence of her chest pain.  No dyspnea.  She is resting comfortably in her bed.  Patient has mild troponin leak likely secondary to ESRD, she has chronic elevated troponins.  No ongoing chest pain.  Recommend she follow-up with her cardiologist as an outpatient and ask 24 to 48 hours. Discussed with nephrology and patient will be able to receive her hemodialysis this afternoon  Recent cath 9/27   She has history of ongoing anginal chest pain, favor stable angina as etiology of symptoms today.  The patient improved significantly and was discharged in stable condition. Detailed discussions were had with the patient regarding current findings, and need for close f/u with PCP or on call doctor. The patient has been instructed to return immediately if the symptoms worsen in any way for re-evaluation. Patient verbalized understanding and is in agreement with current care plan. All questions answered prior to discharge.   Procedures  Final Clinical Impressions(s) / ED Diagnoses     ICD-10-CM   1. Shortness of breath  R06.02     2. Precordial pain  R07.2     3. End-stage renal disease on hemodialysis [facility])  N18.6    Z99.2       ED Discharge Orders     None       Discharge Instructions   None         Jeanell Sparrow, DO 08/25/21 1029

## 2021-08-25 NOTE — Discharge Instructions (Addendum)
Please follow up with hemodialysis this afternoon, see cardiologist in the next couple days regarding chest pain.

## 2021-08-25 NOTE — Progress Notes (Signed)
Anesthesia Chart Review: Same day workup  Follows with cardiology for history of HLD, HTN, chronic angina, CAD s/p CABG in 2005 with multiple PCI's since with the most recent being an NSTEMI in 12/2020 with DESx2 to SVG-RCA and recent NSTEMI in 02/2021 and found to have 75% stenosis along the SVG to PDA which was treated with scoring balloon angioplasty and also received DES placement to 80% stenosis along the mid graft lesion. Also required intervention to her subclavian stent as it was felt this could be compromising flow into the LIMA.  Patient was again hospitalized 06/13/2021 through 06/17/2021 with NSTEMI. Cardiology consulted, underwent left heart cath, noted to have severe three-vessel CAD, culprit lesion was suspected to be severe in-stent restenosis and SVG to RCA, this was treated with balloon angioplasty 9/27.  Last outpatient cardiology appointment was with Levell July, NP on 07/01/2021.  Per note, "Status post NSTEMI with scoring balloon angioplasty to proximal SVG RCA lesion with 0% stenosis status postintervention.  Currently has some mild occasional left chest pain which she states she is unable to discern if it is coming from her significant GERD or her heart disease.  She states she is unable to take her Ranexa very often except for days not scheduled for dialysis due to causing hypotension.  Right groin catheter access site with no redness, swelling, drainage or pain.  Good pulses.  Continue Ranexa 500 mg p.o. twice daily.  Continue sublingual nitroglycerin as needed, continue aspirin 81 mg daily, continue Plavix 75 mg p.o. daily."  She was advised to follow-up with cardiology in 6 months.  She was admitted 07/12/2021 through 07/15/2021 for acute exacerbation of asthma/COPD.  She was treated with IV steroids, bronchodilators, antibiotics.  CT showed evidence of pneumonia.  Post discharge she followed up with pulmonologist Dr. Melvyn Novas on 07/22/2021.  Per note, "her baseline lung function is  minimally abnormal and does not fit criteria for COPD so better defined as AB requiring intermittent Symbicort."  She was coached on inhaler technique.  She was advised to follow-up with pulmonology on an as-needed basis.  Patient was seen in ED 08/24/2021 and 08/25/2021 with complaint of shortness of breath and chest pain.  Her symptoms were felt largely due to fluid overload.  Troponins were mildly elevated; felt likely to be troponin leak secondary to ESRD.  Per ED MD note, "She has history of ongoing anginal chest pain, favor stable angina as etiology of symptoms today. The patient improved significantly and was discharged in stable condition. Detailed discussions were had with the patient regarding current findings, and need for close f/u with PCP or on call doctor. The patient has been instructed to return immediately if the symptoms worsen in any way for re-evaluation. Patient verbalized understanding and is in agreement with current care plan. All questions answered prior to discharge."  Patient was discussed with nephrology and she was arranged to receive dialysis outpatient later that day.   Uncontrolled IDDM 2, appears CBG was 469 prior to discharge from ED on 08/25/2021. Last A1c 7.2 on 06/14/21.  Chronically low blood pressure, maintained on midodrine.  ESRD via right IJ [facility] on Tuesday Thursday Saturday.  She will need DOS labs and evaluation.  EKG 08/25/21: Sinus tachycardia. Rate 104. Borderline intraventricular conduction delay. Abnormal R-wave progression, late transition. Repol abnrm, severe global ischemia (LM/MVD) EKG 08/24/21: Sinus tachycardia. Rate 101. Nonspecific intraventricular conduction delay. Nonspecific repol abnormality, diffuse leads EKG 07/14/21: Normal sinus rhythm. Rate 84. Left ventricular hypertrophy with repolarization abnormality ( Cornell  product )  TTE 06/15/21:  1. Poor endocardial visualization even with contrast Appears to be some  inferior basal wall  hypokineis on contrast images . Left ventricular  ejection fraction, by estimation, is 50 to 55%. The left ventricle has low  normal function. The left ventricle  demonstrates regional wall motion abnormalities (see scoring  diagram/findings for description). There is moderate left ventricular  hypertrophy. Left ventricular diastolic parameters are consistent with  Grade II diastolic dysfunction  (pseudonormalization). Elevated left ventricular end-diastolic pressure.   2. Right ventricular systolic function is normal. The right ventricular  size is normal.   3. Left atrial size was mildly dilated.   4. The mitral valve is abnormal. Trivial mitral valve regurgitation. No  evidence of mitral stenosis.   5. The aortic valve is tricuspid. Aortic valve regurgitation is not  visualized. Mild to moderate aortic valve sclerosis/calcification is  present, without any evidence of aortic stenosis.   6. The inferior vena cava is normal in size with greater than 50%  respiratory variability, suggesting right atrial pressure of 3 mmHg.   Cath and PCI 06/14/21:   Ost LM to Dist LM lesion is 95% stenosed-severe restenosis   Prox LAD to Mid LAD lesion is 100% stenosed.   Ost LAD to Prox LAD lesion is 100% stenosed.  LIMA to LAD is patent.  Ostial LIMA stent with 30% in-stent restenosis.   Ost Cx to Mid Cx lesion is 99% stenosed.  SVG to OM/LPDA is occluded.   1st Mrg-1 lesion is 99% stenosed.   1st Mrg-2 lesion is 100% stenosed.   Origin to Prox Graft lesion is 30% stenosed.   Prox RCA lesion is 100% stenosed.  Severe in-stent restenosis in the proximal graft.   Prox Graft-1 lesion is 90% stenosed.   Scoring balloon angioplasty was performed using a 3.5 mm core flex balloon.  This was followed with angioplasty by [place] 4.0X12.   Post intervention, there is a 0% residual stenosis.   LIMA and is normal in caliber.   LV end diastolic pressure is mildly elevated.   There is no aortic valve  stenosis.   Severe three-vessel coronary artery disease.  Culprit for this presentation was likely the severe in-stent restenosis in the SVG to RCA.  Known in-stent restenosis of prior left main stent. Patent ostial LIMA stent with only 30% in-stent restenosis.     Mild volume overload.  The patient is due for dialysis.   Donna Howe [facility] Short Stay Center/Anesthesiology Phone [phone] 08/25/2021 2:37 PM

## 2021-08-26 ENCOUNTER — Encounter (HOSPITAL_COMMUNITY): Payer: Self-pay

## 2021-08-26 ENCOUNTER — Other Ambulatory Visit: Payer: Self-pay

## 2021-08-26 ENCOUNTER — Encounter (HOSPITAL_COMMUNITY)
Admission: EM | Disposition: A | Discharge status: Home / Self Care | Payer: Self-pay | Source: Home / Self Care | Attending: Cardiology

## 2021-08-26 ENCOUNTER — Emergency Department (HOSPITAL_COMMUNITY): Payer: Medicare HMO

## 2021-08-26 ENCOUNTER — Inpatient Hospital Stay (HOSPITAL_COMMUNITY)
Admission: EM | Disposition: A | Discharge status: Home / Self Care | Payer: Self-pay | Source: Home / Self Care | Attending: Cardiology

## 2021-08-26 ENCOUNTER — Ambulatory Visit (HOSPITAL_COMMUNITY): Admission: RE | Admit: 2021-08-26 | Payer: Medicare HMO | Source: Home / Self Care | Admitting: Vascular Surgery

## 2021-08-26 ENCOUNTER — Inpatient Hospital Stay (HOSPITAL_COMMUNITY)
Admission: EM | Admit: 2021-08-26 | Discharge: 2021-08-28 | DRG: 250 | Disposition: A | Discharge status: Home / Self Care | Payer: Medicare HMO | Attending: Cardiology | Admitting: Cardiology

## 2021-08-26 DIAGNOSIS — I2510 Atherosclerotic heart disease of native coronary artery without angina pectoris: Secondary | ICD-10-CM | POA: Diagnosis not present

## 2021-08-26 DIAGNOSIS — E039 Hypothyroidism, unspecified: Secondary | ICD-10-CM | POA: Diagnosis present

## 2021-08-26 DIAGNOSIS — Z8349 Family history of other endocrine, nutritional and metabolic diseases: Secondary | ICD-10-CM

## 2021-08-26 DIAGNOSIS — I5032 Chronic diastolic (congestive) heart failure: Secondary | ICD-10-CM | POA: Diagnosis present

## 2021-08-26 DIAGNOSIS — E1151 Type 2 diabetes mellitus with diabetic peripheral angiopathy without gangrene: Secondary | ICD-10-CM | POA: Diagnosis present

## 2021-08-26 DIAGNOSIS — I951 Orthostatic hypotension: Secondary | ICD-10-CM | POA: Diagnosis present

## 2021-08-26 DIAGNOSIS — Z992 Dependence on renal dialysis: Secondary | ICD-10-CM

## 2021-08-26 DIAGNOSIS — R0602 Shortness of breath: Secondary | ICD-10-CM | POA: Diagnosis not present

## 2021-08-26 DIAGNOSIS — Y828 Other medical devices associated with adverse incidents: Secondary | ICD-10-CM | POA: Diagnosis not present

## 2021-08-26 DIAGNOSIS — J449 Chronic obstructive pulmonary disease, unspecified: Secondary | ICD-10-CM | POA: Diagnosis present

## 2021-08-26 DIAGNOSIS — R739 Hyperglycemia, unspecified: Secondary | ICD-10-CM | POA: Diagnosis not present

## 2021-08-26 DIAGNOSIS — N2581 Secondary hyperparathyroidism of renal origin: Secondary | ICD-10-CM | POA: Diagnosis present

## 2021-08-26 DIAGNOSIS — Z20822 Contact with and (suspected) exposure to covid-19: Secondary | ICD-10-CM | POA: Diagnosis not present

## 2021-08-26 DIAGNOSIS — Z880 Allergy status to penicillin: Secondary | ICD-10-CM

## 2021-08-26 DIAGNOSIS — I252 Old myocardial infarction: Secondary | ICD-10-CM | POA: Diagnosis not present

## 2021-08-26 DIAGNOSIS — I5020 Unspecified systolic (congestive) heart failure: Secondary | ICD-10-CM | POA: Diagnosis present

## 2021-08-26 DIAGNOSIS — I132 Hypertensive heart and chronic kidney disease with heart failure and with stage 5 chronic kidney disease, or end stage renal disease: Secondary | ICD-10-CM | POA: Diagnosis not present

## 2021-08-26 DIAGNOSIS — Z7951 Long term (current) use of inhaled steroids: Secondary | ICD-10-CM

## 2021-08-26 DIAGNOSIS — I700 Atherosclerosis of aorta: Secondary | ICD-10-CM | POA: Diagnosis present

## 2021-08-26 DIAGNOSIS — E1122 Type 2 diabetes mellitus with diabetic chronic kidney disease: Secondary | ICD-10-CM | POA: Diagnosis present

## 2021-08-26 DIAGNOSIS — R079 Chest pain, unspecified: Secondary | ICD-10-CM | POA: Diagnosis not present

## 2021-08-26 DIAGNOSIS — Z83438 Family history of other disorder of lipoprotein metabolism and other lipidemia: Secondary | ICD-10-CM

## 2021-08-26 DIAGNOSIS — Z8616 Personal history of COVID-19: Secondary | ICD-10-CM

## 2021-08-26 DIAGNOSIS — I25110 Atherosclerotic heart disease of native coronary artery with unstable angina pectoris: Secondary | ICD-10-CM | POA: Diagnosis present

## 2021-08-26 DIAGNOSIS — I25700 Atherosclerosis of coronary artery bypass graft(s), unspecified, with unstable angina pectoris: Secondary | ICD-10-CM | POA: Diagnosis present

## 2021-08-26 DIAGNOSIS — Z955 Presence of coronary angioplasty implant and graft: Secondary | ICD-10-CM

## 2021-08-26 DIAGNOSIS — Z87891 Personal history of nicotine dependence: Secondary | ICD-10-CM

## 2021-08-26 DIAGNOSIS — I2584 Coronary atherosclerosis due to calcified coronary lesion: Secondary | ICD-10-CM | POA: Diagnosis present

## 2021-08-26 DIAGNOSIS — N186 End stage renal disease: Secondary | ICD-10-CM | POA: Diagnosis present

## 2021-08-26 DIAGNOSIS — T82855A Stenosis of coronary artery stent, initial encounter: Secondary | ICD-10-CM | POA: Diagnosis not present

## 2021-08-26 DIAGNOSIS — Z79890 Hormone replacement therapy (postmenopausal): Secondary | ICD-10-CM

## 2021-08-26 DIAGNOSIS — I708 Atherosclerosis of other arteries: Secondary | ICD-10-CM | POA: Diagnosis present

## 2021-08-26 DIAGNOSIS — Z743 Need for continuous supervision: Secondary | ICD-10-CM | POA: Diagnosis not present

## 2021-08-26 DIAGNOSIS — I5022 Chronic systolic (congestive) heart failure: Secondary | ICD-10-CM | POA: Diagnosis present

## 2021-08-26 DIAGNOSIS — E782 Mixed hyperlipidemia: Secondary | ICD-10-CM | POA: Diagnosis present

## 2021-08-26 DIAGNOSIS — I214 Non-ST elevation (NSTEMI) myocardial infarction: Secondary | ICD-10-CM | POA: Diagnosis not present

## 2021-08-26 DIAGNOSIS — R000 Tachycardia, unspecified: Secondary | ICD-10-CM | POA: Diagnosis present

## 2021-08-26 DIAGNOSIS — Z888 Allergy status to other drugs, medicaments and biological substances status: Secondary | ICD-10-CM

## 2021-08-26 DIAGNOSIS — Z7902 Long term (current) use of antithrombotics/antiplatelets: Secondary | ICD-10-CM

## 2021-08-26 DIAGNOSIS — M109 Gout, unspecified: Secondary | ICD-10-CM | POA: Diagnosis present

## 2021-08-26 DIAGNOSIS — D631 Anemia in chronic kidney disease: Secondary | ICD-10-CM | POA: Diagnosis present

## 2021-08-26 DIAGNOSIS — I771 Stricture of artery: Secondary | ICD-10-CM | POA: Diagnosis present

## 2021-08-26 DIAGNOSIS — Z833 Family history of diabetes mellitus: Secondary | ICD-10-CM

## 2021-08-26 DIAGNOSIS — I959 Hypotension, unspecified: Secondary | ICD-10-CM | POA: Diagnosis not present

## 2021-08-26 DIAGNOSIS — I9589 Other hypotension: Secondary | ICD-10-CM | POA: Diagnosis present

## 2021-08-26 DIAGNOSIS — R069 Unspecified abnormalities of breathing: Secondary | ICD-10-CM | POA: Diagnosis not present

## 2021-08-26 DIAGNOSIS — Y831 Surgical operation with implant of artificial internal device as the cause of abnormal reaction of the patient, or of later complication, without mention of misadventure at the time of the procedure: Secondary | ICD-10-CM | POA: Diagnosis present

## 2021-08-26 DIAGNOSIS — Z7982 Long term (current) use of aspirin: Secondary | ICD-10-CM

## 2021-08-26 DIAGNOSIS — Z951 Presence of aortocoronary bypass graft: Secondary | ICD-10-CM

## 2021-08-26 DIAGNOSIS — I25810 Atherosclerosis of coronary artery bypass graft(s) without angina pectoris: Secondary | ICD-10-CM | POA: Diagnosis not present

## 2021-08-26 DIAGNOSIS — I779 Disorder of arteries and arterioles, unspecified: Secondary | ICD-10-CM | POA: Diagnosis present

## 2021-08-26 DIAGNOSIS — Z79899 Other long term (current) drug therapy: Secondary | ICD-10-CM

## 2021-08-26 DIAGNOSIS — Z8249 Family history of ischemic heart disease and other diseases of the circulatory system: Secondary | ICD-10-CM

## 2021-08-26 DIAGNOSIS — R0789 Other chest pain: Secondary | ICD-10-CM | POA: Diagnosis not present

## 2021-08-26 DIAGNOSIS — Z794 Long term (current) use of insulin: Secondary | ICD-10-CM

## 2021-08-26 DIAGNOSIS — E119 Type 2 diabetes mellitus without complications: Secondary | ICD-10-CM

## 2021-08-26 DIAGNOSIS — F419 Anxiety disorder, unspecified: Secondary | ICD-10-CM | POA: Diagnosis present

## 2021-08-26 HISTORY — DX: Unspecified systolic (congestive) heart failure: I50.20

## 2021-08-26 HISTORY — PX: LEFT HEART CATH AND CORS/GRAFTS ANGIOGRAPHY: CATH118250

## 2021-08-26 HISTORY — PX: CORONARY BALLOON ANGIOPLASTY: CATH118233

## 2021-08-26 LAB — POCT ACTIVATED CLOTTING TIME
Activated Clotting Time: 317 seconds
Activated Clotting Time: 269 seconds
Activated Clotting Time: 239 seconds
Activated Clotting Time: 149 seconds

## 2021-08-26 LAB — COMPREHENSIVE METABOLIC PANEL
ALT: 23 U/L (ref 0–44)
ALT: 28 U/L (ref 0–44)
AST: 38 U/L (ref 15–41)
AST: 91 U/L — ABNORMAL HIGH (ref 15–41)
Albumin: 3.7 g/dL (ref 3.5–5.0)
Albumin: 2.8 g/dL — ABNORMAL LOW (ref 3.5–5.0)
Alkaline Phosphatase: 95 U/L (ref 38–126)
Alkaline Phosphatase: 64 U/L (ref 38–126)
Anion gap: 12 (ref 5–15)
Anion gap: 11 (ref 5–15)
BUN: 25 mg/dL — ABNORMAL HIGH (ref 8–23)
BUN: 29 mg/dL — ABNORMAL HIGH (ref 8–23)
CO2: 30 mmol/L (ref 22–32)
CO2: 24 mmol/L (ref 22–32)
Calcium: 9.9 mg/dL (ref 8.9–10.3)
Calcium: 8.4 mg/dL — ABNORMAL LOW (ref 8.9–10.3)
Chloride: 93 mmol/L — ABNORMAL LOW (ref 98–111)
Chloride: 96 mmol/L — ABNORMAL LOW (ref 98–111)
Creatinine, Ser: 3.93 mg/dL — ABNORMAL HIGH (ref 0.44–1.00)
Creatinine, Ser: 4.52 mg/dL — ABNORMAL HIGH (ref 0.44–1.00)
GFR, Estimated: 12 mL/min — ABNORMAL LOW (ref >60)
GFR, Estimated: 10 mL/min — ABNORMAL LOW (ref >60)
Glucose, Bld: 439 mg/dL — ABNORMAL HIGH (ref 70–99)
Glucose, Bld: 247 mg/dL — ABNORMAL HIGH (ref 70–99)
Potassium: 3.8 mmol/L (ref 3.5–5.1)
Potassium: 4.2 mmol/L (ref 3.5–5.1)
Sodium: 135 mmol/L (ref 135–145)
Sodium: 131 mmol/L — ABNORMAL LOW (ref 135–145)
Total Bilirubin: 0.4 mg/dL (ref 0.3–1.2)
Total Bilirubin: 0.7 mg/dL (ref 0.3–1.2)
Total Protein: 7.4 g/dL (ref 6.5–8.1)
Total Protein: 5.6 g/dL — ABNORMAL LOW (ref 6.5–8.1)

## 2021-08-26 LAB — CBC WITH DIFFERENTIAL/PLATELET
Abs Immature Granulocytes: 0.17 10*3/uL — ABNORMAL HIGH (ref 0.00–0.07)
Basophils Absolute: 0.1 10*3/uL (ref 0.0–0.1)
Basophils Relative: 0 %
Eosinophils Absolute: 0.2 10*3/uL (ref 0.0–0.5)
Eosinophils Relative: 1 %
HCT: 33.9 % — ABNORMAL LOW (ref 36.0–46.0)
Hemoglobin: 10.7 g/dL — ABNORMAL LOW (ref 12.0–15.0)
Immature Granulocytes: 1 %
Lymphocytes Relative: 16 %
Lymphs Abs: 2.5 10*3/uL (ref 0.7–4.0)
MCH: 34.1 pg — ABNORMAL HIGH (ref 26.0–34.0)
MCHC: 31.6 g/dL (ref 30.0–36.0)
MCV: 108 fL — ABNORMAL HIGH (ref 80.0–100.0)
Monocytes Absolute: 1.2 10*3/uL — ABNORMAL HIGH (ref 0.1–1.0)
Monocytes Relative: 8 %
Neutro Abs: 11.1 10*3/uL — ABNORMAL HIGH (ref 1.7–7.7)
Neutrophils Relative %: 74 %
Platelets: 165 10*3/uL (ref 150–400)
RBC: 3.14 MIL/uL — ABNORMAL LOW (ref 3.87–5.11)
RDW: 14.3 % (ref 11.5–15.5)
WBC: 15.1 10*3/uL — ABNORMAL HIGH (ref 4.0–10.5)
nRBC: 0 % (ref 0.0–0.2)

## 2021-08-26 LAB — RENAL FUNCTION PANEL
Albumin: 2.7 g/dL — ABNORMAL LOW (ref 3.5–5.0)
Anion gap: 10 (ref 5–15)
BUN: 29 mg/dL — ABNORMAL HIGH (ref 8–23)
CO2: 24 mmol/L (ref 22–32)
Calcium: 8.3 mg/dL — ABNORMAL LOW (ref 8.9–10.3)
Chloride: 96 mmol/L — ABNORMAL LOW (ref 98–111)
Creatinine, Ser: 4.44 mg/dL — ABNORMAL HIGH (ref 0.44–1.00)
GFR, Estimated: 11 mL/min — ABNORMAL LOW (ref >60)
Glucose, Bld: 243 mg/dL — ABNORMAL HIGH (ref 70–99)
Phosphorus: 4.9 mg/dL — ABNORMAL HIGH (ref 2.5–4.6)
Potassium: 4.2 mmol/L (ref 3.5–5.1)
Sodium: 130 mmol/L — ABNORMAL LOW (ref 135–145)

## 2021-08-26 LAB — HEPARIN LEVEL (UNFRACTIONATED): Heparin Unfractionated: 0.35 IU/mL (ref 0.30–0.70)

## 2021-08-26 LAB — RESP PANEL BY RT-PCR (FLU A&B, COVID) ARPGX2
Influenza A by PCR: NEGATIVE
Influenza B by PCR: NEGATIVE
SARS Coronavirus 2 by RT PCR: NEGATIVE

## 2021-08-26 LAB — GLUCOSE, CAPILLARY
Glucose-Capillary: 295 mg/dL — ABNORMAL HIGH (ref 70–99)
Glucose-Capillary: 256 mg/dL — ABNORMAL HIGH (ref 70–99)
Glucose-Capillary: 213 mg/dL — ABNORMAL HIGH (ref 70–99)
Glucose-Capillary: 240 mg/dL — ABNORMAL HIGH (ref 70–99)

## 2021-08-26 LAB — CBC
HCT: 39.3 % (ref 36.0–46.0)
Hemoglobin: 12.7 g/dL (ref 12.0–15.0)
MCH: 35 pg — ABNORMAL HIGH (ref 26.0–34.0)
MCHC: 32.3 g/dL (ref 30.0–36.0)
MCV: 108.3 fL — ABNORMAL HIGH (ref 80.0–100.0)
Platelets: 215 10*3/uL (ref 150–400)
RBC: 3.63 MIL/uL — ABNORMAL LOW (ref 3.87–5.11)
RDW: 14.3 % (ref 11.5–15.5)
WBC: 18.3 10*3/uL — ABNORMAL HIGH (ref 4.0–10.5)
nRBC: 0 % (ref 0.0–0.2)

## 2021-08-26 LAB — TROPONIN I (HIGH SENSITIVITY)
Troponin I (High Sensitivity): 1437 ng/L (ref <18)
Troponin I (High Sensitivity): 6550 ng/L (ref <18)

## 2021-08-26 LAB — MAGNESIUM: Magnesium: 1.9 mg/dL (ref 1.7–2.4)

## 2021-08-26 LAB — MRSA NEXT GEN BY PCR, NASAL: MRSA by PCR Next Gen: NOT DETECTED

## 2021-08-26 LAB — HEMOGLOBIN A1C
Hgb A1c MFr Bld: 7.1 % — ABNORMAL HIGH (ref 4.8–5.6)
Mean Plasma Glucose: 157.07 mg/dL

## 2021-08-26 LAB — PROTIME-INR
INR: 1 (ref 0.8–1.2)
Prothrombin Time: 13 seconds (ref 11.4–15.2)

## 2021-08-26 LAB — APTT: aPTT: 40 seconds — ABNORMAL HIGH (ref 24–36)

## 2021-08-26 LAB — CBG MONITORING, ED: Glucose-Capillary: 477 mg/dL — ABNORMAL HIGH (ref 70–99)

## 2021-08-26 SURGERY — LEFT HEART CATH AND CORS/GRAFTS ANGIOGRAPHY
Anesthesia: LOCAL

## 2021-08-26 SURGERY — FISTULA SUPERFICIALIZATION
Anesthesia: Choice | Laterality: Right

## 2021-08-26 MED ORDER — HEPARIN (PORCINE) IN NACL 1000-0.9 UT/500ML-% IV SOLN
INTRAVENOUS | Status: DC | PRN
  Administered 2021-08-26 (×2): 500 mL

## 2021-08-26 MED ORDER — ATROPINE SULFATE 1 MG/10ML IJ SOSY
PREFILLED_SYRINGE | INTRAMUSCULAR | Status: AC
  Filled 2021-08-26: qty 10

## 2021-08-26 MED ORDER — HEPARIN (PORCINE) 25000 UT/250ML-% IV SOLN
1100.0000 [IU]/h | INTRAVENOUS | Status: DC
  Administered 2021-08-26: 1100 [IU]/h via INTRAVENOUS
  Filled 2021-08-26: qty 250

## 2021-08-26 MED ORDER — ONDANSETRON HCL 4 MG/2ML IJ SOLN: 4.0000 mg | Freq: Four times a day (QID) | INTRAMUSCULAR | Status: DC | PRN

## 2021-08-26 MED ORDER — SODIUM CHLORIDE 0.9% FLUSH: 3.0000 mL | INTRAVENOUS | Status: DC | PRN

## 2021-08-26 MED ORDER — HEPARIN (PORCINE) IN NACL 1000-0.9 UT/500ML-% IV SOLN
INTRAVENOUS | Status: AC
  Filled 2021-08-26: qty 1000

## 2021-08-26 MED ORDER — NITROGLYCERIN 0.4 MG SL SUBL
0.4000 mg | SUBLINGUAL_TABLET | Freq: Once | SUBLINGUAL | Status: AC
  Administered 2021-08-26: 0.4 mg via SUBLINGUAL
  Filled 2021-08-26: qty 1

## 2021-08-26 MED ORDER — SODIUM CHLORIDE 0.9% FLUSH
3.0000 mL | Freq: Two times a day (BID) | INTRAVENOUS | Status: DC
  Administered 2021-08-26 – 2021-08-28 (×5): 3 mL via INTRAVENOUS

## 2021-08-26 MED ORDER — HYDROMORPHONE HCL 1 MG/ML IJ SOLN
0.5000 mg | INTRAMUSCULAR | Status: DC | PRN
  Administered 2021-08-26 – 2021-08-27 (×4): 0.5 mg via INTRAVENOUS
  Filled 2021-08-26: qty 0.5
  Filled 2021-08-26: qty 1
  Filled 2021-08-26 (×2): qty 0.5

## 2021-08-26 MED ORDER — HEPARIN (PORCINE) IN NACL 2000-0.9 UNIT/L-% IV SOLN
INTRAVENOUS | Status: AC
  Filled 2021-08-26: qty 1000

## 2021-08-26 MED ORDER — LIDOCAINE HCL (PF) 1 % IJ SOLN
INTRAMUSCULAR | Status: DC | PRN
  Administered 2021-08-26: 15 mL

## 2021-08-26 MED ORDER — HEPARIN SODIUM (PORCINE) 1000 UNIT/ML IJ SOLN
INTRAMUSCULAR | Status: DC | PRN
  Administered 2021-08-26: 8000 [IU] via INTRAVENOUS

## 2021-08-26 MED ORDER — LIDOCAINE HCL (PF) 1 % IJ SOLN
INTRAMUSCULAR | Status: AC
  Filled 2021-08-26: qty 30

## 2021-08-26 MED ORDER — HEPARIN SODIUM (PORCINE) 1000 UNIT/ML IJ SOLN
INTRAMUSCULAR | Status: AC
  Filled 2021-08-26: qty 10

## 2021-08-26 MED ORDER — FENTANYL CITRATE (PF) 100 MCG/2ML IJ SOLN
INTRAMUSCULAR | Status: DC | PRN
  Administered 2021-08-26 (×2): 25 ug via INTRAVENOUS

## 2021-08-26 MED ORDER — RANOLAZINE ER 500 MG PO TB12
500.0000 mg | ORAL_TABLET | Freq: Every day | ORAL | Status: DC
  Administered 2021-08-26 – 2021-08-27 (×2): 500 mg via ORAL
  Filled 2021-08-26 (×2): qty 1

## 2021-08-26 MED ORDER — NITROGLYCERIN 0.4 MG SL SUBL
SUBLINGUAL_TABLET | SUBLINGUAL | Status: DC | PRN
  Administered 2021-08-26: .4 mg via SUBLINGUAL

## 2021-08-26 MED ORDER — NITROGLYCERIN 0.4 MG SL SUBL: 0.4000 mg | SUBLINGUAL_TABLET | SUBLINGUAL | Status: DC | PRN

## 2021-08-26 MED ORDER — SODIUM CHLORIDE 0.9 % IV BOLUS: 1000.0000 mL | Freq: Once | INTRAVENOUS | Status: DC

## 2021-08-26 MED ORDER — CLOPIDOGREL BISULFATE 75 MG PO TABS
75.0000 mg | ORAL_TABLET | Freq: Every day | ORAL | Status: DC
  Administered 2021-08-26 – 2021-08-28 (×3): 75 mg via ORAL
  Filled 2021-08-26 (×3): qty 1

## 2021-08-26 MED ORDER — ALPRAZOLAM 0.25 MG PO TABS
0.2500 mg | ORAL_TABLET | Freq: Two times a day (BID) | ORAL | Status: DC
  Administered 2021-08-26 – 2021-08-27 (×2): 0.25 mg via ORAL
  Filled 2021-08-26 (×3): qty 1

## 2021-08-26 MED ORDER — INSULIN ASPART 100 UNIT/ML IJ SOLN
0.0000 [IU] | Freq: Three times a day (TID) | INTRAMUSCULAR | Status: DC
  Administered 2021-08-27: 5 [IU] via SUBCUTANEOUS
  Administered 2021-08-27: 3 [IU] via SUBCUTANEOUS
  Administered 2021-08-28: 5 [IU] via SUBCUTANEOUS

## 2021-08-26 MED ORDER — FERRIC CITRATE 1 GM 210 MG(FE) PO TABS
420.0000 mg | ORAL_TABLET | Freq: Three times a day (TID) | ORAL | Status: DC
  Administered 2021-08-27 – 2021-08-28 (×5): 420 mg via ORAL
  Filled 2021-08-26 (×7): qty 2

## 2021-08-26 MED ORDER — SODIUM CHLORIDE 0.9 % IV SOLN: 250.0000 mL | INTRAVENOUS | Status: DC | PRN

## 2021-08-26 MED ORDER — SODIUM CHLORIDE 0.9 % IV SOLN: INTRAVENOUS | Status: DC

## 2021-08-26 MED ORDER — ATORVASTATIN CALCIUM 80 MG PO TABS
80.0000 mg | ORAL_TABLET | Freq: Every day | ORAL | Status: DC
  Administered 2021-08-26 – 2021-08-27 (×2): 80 mg via ORAL
  Filled 2021-08-26 (×2): qty 1

## 2021-08-26 MED ORDER — PANTOPRAZOLE SODIUM 40 MG PO TBEC
40.0000 mg | DELAYED_RELEASE_TABLET | Freq: Two times a day (BID) | ORAL | Status: DC
  Administered 2021-08-26 – 2021-08-28 (×5): 40 mg via ORAL
  Filled 2021-08-26 (×5): qty 1

## 2021-08-26 MED ORDER — HYDROMORPHONE HCL 1 MG/ML IJ SOLN
0.5000 mg | Freq: Once | INTRAMUSCULAR | Status: AC
  Administered 2021-08-26: 0.5 mg via INTRAVENOUS
  Filled 2021-08-26: qty 1

## 2021-08-26 MED ORDER — IPRATROPIUM BROMIDE HFA 17 MCG/ACT IN AERS: 2.0000 | INHALATION_SPRAY | RESPIRATORY_TRACT | Status: DC | PRN

## 2021-08-26 MED ORDER — INSULIN ASPART PROT & ASPART (70-30 MIX) 100 UNIT/ML ~~LOC~~ SUSP
24.0000 [IU] | Freq: Two times a day (BID) | SUBCUTANEOUS | Status: DC
  Administered 2021-08-27 – 2021-08-28 (×3): 24 [IU] via SUBCUTANEOUS
  Filled 2021-08-26 (×2): qty 10

## 2021-08-26 MED ORDER — FLUTICASONE FUROATE-VILANTEROL 100-25 MCG/ACT IN AEPB
1.0000 | INHALATION_SPRAY | Freq: Every day | RESPIRATORY_TRACT | Status: DC
  Administered 2021-08-26 – 2021-08-28 (×3): 1 via RESPIRATORY_TRACT
  Filled 2021-08-26: qty 28

## 2021-08-26 MED ORDER — SODIUM CHLORIDE 0.9% FLUSH
3.0000 mL | Freq: Two times a day (BID) | INTRAVENOUS | Status: DC
  Administered 2021-08-26 – 2021-08-28 (×4): 3 mL via INTRAVENOUS

## 2021-08-26 MED ORDER — ALPRAZOLAM 0.5 MG PO TABS
0.5000 mg | ORAL_TABLET | Freq: Every day | ORAL | Status: DC
  Administered 2021-08-26 – 2021-08-27 (×2): 0.5 mg via ORAL
  Filled 2021-08-26 (×2): qty 1

## 2021-08-26 MED ORDER — MIDODRINE HCL 5 MG PO TABS
10.0000 mg | ORAL_TABLET | Freq: Two times a day (BID) | ORAL | Status: DC
  Administered 2021-08-27: 10 mg via ORAL
  Filled 2021-08-26: qty 2

## 2021-08-26 MED ORDER — ALBUTEROL SULFATE (2.5 MG/3ML) 0.083% IN NEBU: 2.5000 mg | INHALATION_SOLUTION | RESPIRATORY_TRACT | Status: DC | PRN

## 2021-08-26 MED ORDER — NITROGLYCERIN 0.4 MG SL SUBL
SUBLINGUAL_TABLET | SUBLINGUAL | Status: AC
  Filled 2021-08-26: qty 1

## 2021-08-26 MED ORDER — INSULIN ISOPHANE & REGULAR (HUMAN 70-30)100 UNIT/ML KWIKPEN: 24.0000 [IU] | PEN_INJECTOR | Freq: Two times a day (BID) | SUBCUTANEOUS | Status: DC

## 2021-08-26 MED ORDER — LABETALOL HCL 5 MG/ML IV SOLN: 10.0000 mg | INTRAVENOUS | Status: AC | PRN

## 2021-08-26 MED ORDER — ACETAMINOPHEN 325 MG PO TABS: 650.0000 mg | ORAL_TABLET | ORAL | Status: DC | PRN

## 2021-08-26 MED ORDER — CHLORHEXIDINE GLUCONATE CLOTH 2 % EX PADS
6.0000 | MEDICATED_PAD | Freq: Every day | CUTANEOUS | Status: DC
  Administered 2021-08-26: 6 via TOPICAL

## 2021-08-26 MED ORDER — GABAPENTIN 400 MG PO CAPS
400.0000 mg | ORAL_CAPSULE | Freq: Every day | ORAL | Status: DC
  Administered 2021-08-26 – 2021-08-27 (×2): 400 mg via ORAL
  Filled 2021-08-26 (×2): qty 1

## 2021-08-26 MED ORDER — HEPARIN BOLUS VIA INFUSION
4000.0000 [IU] | Freq: Once | INTRAVENOUS | Status: AC
  Administered 2021-08-26: 4000 [IU] via INTRAVENOUS

## 2021-08-26 MED ORDER — FENTANYL CITRATE (PF) 100 MCG/2ML IJ SOLN
INTRAMUSCULAR | Status: AC
  Filled 2021-08-26: qty 2

## 2021-08-26 MED ORDER — MIDAZOLAM HCL 2 MG/2ML IJ SOLN
INTRAMUSCULAR | Status: DC | PRN
  Administered 2021-08-26: 1 mg via INTRAVENOUS

## 2021-08-26 MED ORDER — EZETIMIBE 10 MG PO TABS
10.0000 mg | ORAL_TABLET | Freq: Every day | ORAL | Status: DC
  Administered 2021-08-26 – 2021-08-27 (×2): 10 mg via ORAL
  Filled 2021-08-26 (×2): qty 1

## 2021-08-26 MED ORDER — IPRATROPIUM BROMIDE 0.02 % IN SOLN: 0.5000 mg | RESPIRATORY_TRACT | Status: DC | PRN

## 2021-08-26 MED ORDER — VERAPAMIL HCL 2.5 MG/ML IV SOLN
INTRAVENOUS | Status: AC
  Filled 2021-08-26: qty 2

## 2021-08-26 MED ORDER — MIDAZOLAM HCL 2 MG/2ML IJ SOLN
INTRAMUSCULAR | Status: AC
  Filled 2021-08-26: qty 2

## 2021-08-26 MED ORDER — ASPIRIN EC 81 MG PO TBEC
81.0000 mg | DELAYED_RELEASE_TABLET | Freq: Every day | ORAL | Status: DC
  Administered 2021-08-26 – 2021-08-28 (×3): 81 mg via ORAL
  Filled 2021-08-26 (×3): qty 1

## 2021-08-26 MED ORDER — ASPIRIN 81 MG PO CHEW
324.0000 mg | CHEWABLE_TABLET | Freq: Once | ORAL | Status: AC
  Administered 2021-08-26: 324 mg via ORAL
  Filled 2021-08-26: qty 4

## 2021-08-26 MED ORDER — IOHEXOL 350 MG/ML SOLN
INTRAVENOUS | Status: DC | PRN
  Administered 2021-08-26: 145 mL

## 2021-08-26 MED ORDER — HEPARIN SODIUM (PORCINE) 5000 UNIT/ML IJ SOLN: 5000.0000 [IU] | Freq: Three times a day (TID) | INTRAMUSCULAR | Status: DC

## 2021-08-26 MED ORDER — LEVOTHYROXINE SODIUM 25 MCG PO TABS
125.0000 ug | ORAL_TABLET | Freq: Every day | ORAL | Status: DC
  Administered 2021-08-27 – 2021-08-28 (×2): 125 ug via ORAL
  Filled 2021-08-26 (×2): qty 1

## 2021-08-26 SURGICAL SUPPLY — 21 items
BALLN SAPPHIRE 3.0X15 (BALLOONS) ×2
BALLN SAPPHIRE ~~LOC~~ 3.0X12 (BALLOONS) ×2 IMPLANT
BALLN SCOREFLEX 3.50X20 (BALLOONS) ×2
BALLOON SAPPHIRE 3.0X15 (BALLOONS) ×1 IMPLANT
BALLOON SCOREFLEX 3.50X20 (BALLOONS) ×1 IMPLANT
CATH EXPO 5F MPA-1 (CATHETERS) ×2 IMPLANT
CATH INFINITI 5FR JL5 (CATHETERS) ×2 IMPLANT
CATH INFINITI 5FR MULTPACK ANG (CATHETERS) ×2 IMPLANT
CATH VISTA GUIDE 6FR XB4 (CATHETERS) ×2 IMPLANT
GUIDEWIRE ANGLED .035X150CM (WIRE) ×2 IMPLANT
KIT ENCORE 26 ADVANTAGE (KITS) ×2 IMPLANT
KIT HEART LEFT (KITS) ×2 IMPLANT
PACK CARDIAC CATHETERIZATION (CUSTOM PROCEDURE TRAY) ×2 IMPLANT
SHEATH PINNACLE 5F 10CM (SHEATH) ×2 IMPLANT
SHEATH PINNACLE 6F 10CM (SHEATH) ×2 IMPLANT
SHEATH PROBE COVER 6X72 (BAG) ×2 IMPLANT
TRANSDUCER W/STOPCOCK (MISCELLANEOUS) ×2 IMPLANT
TUBING CIL FLEX 10 FLL-RA (TUBING) ×2 IMPLANT
WIRE ASAHI PROWATER 180CM (WIRE) ×2 IMPLANT
WIRE EMERALD 3MM-J .035X150CM (WIRE) ×2 IMPLANT
WIRE EMERALD 3MM-J .035X260CM (WIRE) ×2 IMPLANT

## 2021-08-26 NOTE — Progress Notes (Signed)
Patient called in stating she needed to cancel her scheduled surgery for today because she was at [facility] waiting on [facility] to [facility] her up because she was having a "mild heart attack". MD and OR made aware. Case cancelled for today.

## 2021-08-26 NOTE — ED Notes (Signed)
Pt noticed to have low bp. Dr aware and ordered bolus. Pt refused bolus due to dialysis pt. Dr at bedside aware of refusal. Attempting recheck of bp.

## 2021-08-26 NOTE — ED Notes (Signed)
Date and time results received: 08/26/21 0448 (use smartphrase ".now" to insert current time)  Test: Troponin  Critical Value: 1437  Name of Provider Notified: Dr. Sedonia Small  Orders Received? Or Actions Taken?: Acknowledged

## 2021-08-26 NOTE — Progress Notes (Signed)
Inpatient Diabetes Program Recommendations  AACE/ADA: New Consensus Statement on Inpatient Glycemic Control   Target Ranges:  Prepandial:   less than 140 mg/dL      Peak postprandial:   less than 180 mg/dL (1-2 hours)      Critically ill patients:  140 - 180 mg/dL     Latest Reference Range & Units 08/25/21 09:38 08/26/21 01:40  Glucose-Capillary 70 - 99 mg/dL 469 (H) 477 (H)    Latest Reference Range & Units 08/24/21 22:33 08/25/21 07:04 08/26/21 03:40  Glucose 70 - 99 mg/dL 210 (H) 509 (HH) 439 (H)   Diabetes history: DM2 Outpatient Diabetes medications: Novolin 70/30 35 units BID (per med list pt taking 24 units BID) Current orders for Inpatient glycemic control: None; in ED  Inpatient Diabetes Program Recommendations:    Insulin: Please consider ordering Semglee 20 units Q24H, CBGs Q4H, Novolog 0-15 units Q4H.  Thanks, Barnie Alderman, RN, MSN, CDE Diabetes Coordinator Inpatient Diabetes Program [phone] (Team Pager from 8am to 5pm)

## 2021-08-26 NOTE — ED Provider Notes (Signed)
Patient admitted to [facility] for NSTEMI patient blood pressures are soft.  But she is sitting up stating that she feels fine.  Wanted to give her a fluid bolus she refused because she is a dialysis patient.  Normally dialyzed Tuesday Thursdays and Saturdays patient was dialyzed yesterday.  [facility] is on their way.  But cannot force the patient to get fluids.  Clinically she seems actually very good it may have been from the pain medicine she just received.  Patient was sitting up drinking water feeling not lightheaded not dizzy really without any complaints at all.  Repeat blood pressure showed pressure systolic to be 83 however we do have difficulty getting blood pressures on her either had to use her leg or her forearm.   Fredia Sorrow, MD 08/26/21 [phone]

## 2021-08-26 NOTE — Progress Notes (Signed)
Paged by nurse with patient having chest pain 8 out of 10 and requesting Dilaudid.  Upon arrival to patient room, she was resting comfortably.  Her systolic blood pressure was in 60s.  I have readjusted her blood pressure cuff to left arm with improved systolic blood pressure in 80s.    She was having just mild chest pain.  She was stable.  Patient reports chronically low blood pressure.  She declined fluid bolus initially.  Will start KVO fluids for cardiac catheterization later today.

## 2021-08-26 NOTE — H&P (Addendum)
Cardiology Admission History and Physical:   Patient ID: MAECI KALBFLEISCH MRN: [id number]; DOB: [birth year]   Admission date: 08/26/2021  PCP:  Curlene Labrum, MD   [facility] Providers Cardiologist:  Rozann Lesches, MD        Chief Complaint: NSTEMI  Patient Profile:   Donna Howe is a 65 y.o. female with past medical history of CAD (s/p CABG in 2005 with multiple PCI's since including PTCA to SVG-OM1 in 01/2004, PTCA/DES to LM and prox LCx in 03/2004 and DES to SVG-OM1, PTCA/DES to SVG-OM in 01/2011, PTCA/DES to LM and LCx in 2015, cutting balloon angioplasty to prox Cx and DES to SVG-RCA in 05/2017, NSTEMI in 03/2018 with DES to mid-RCA and DES to LIMA-LAD, NSTEMI in 06/2020 and medical management recommended as she was felt to be high-risk for re-do CABG and poor target vessels, s/p NSTEMI in 12/2020 with DESx2 to SVG-RCA, NSTEMI in 05/2021 with ISR of stent in the SVG-RCA and treated with angioplasty), HTN, HLD, Type 2 DM, carotid artery stenosis (s/p R CEA in 2014), left subclavian stenosis (s/p stent in 07/2020) and ESRD who is being seen 08/26/2021 for the evaluation of NSTEMI at the request of Dr. Sedonia Small.  History of Present Illness:   Ms. Grussing was last examined by Katina Dung, NP in 06/2021 following her recent hospitalization for an NSTEMI during which she underwent angioplasty to in-stent restenosis of the SVG to RCA. At the time of her follow-up, she did report minor chest discomfort which would radiate into her left arm and spontaneously resolve. She was continued on her current cardiac medications at that time. She was again admitted to [facility] in 06/2021 for an acute COPD exacerbation but it does not appear any changes were made to her cardiac medications at that time.  She presented to [facility] ED on 08/24/2021 for evaluation of worsening dyspnea and symptoms at that time were felt to be most consistent with a COPD exacerbation as symptoms improved with  nebulizer treatment and she was discharged home. Presented back to the ED on 08/25/2021 for evaluation of worsening dyspnea and chest pain with Hs troponin values flat at 73 and 95, therefore she was discharged home and informed to follow-up with Cardiology as an outpatient.  Presented back to the ED during the early morning hours of 08/26/2021 for evaluation of worsening chest pain and dyspnea on exertion. Reports that her shortness of breath continued to progress and she had difficulty trying to catch her breath, even at rest. Did have HD yesterday and reports intermittent chest pain at that time. Her chest pain acutely worsened last night which prompted her to come to the ED. She has received SL NTG along with Dilaudid 0.5 mg x2 and says her pain has significantly improved. BP currently soft with SBP in the 80's and says this is "normal" for her. Initial labs show WBC 18.3, Hgb 12.7, platelets 215, Na+ 135, K+ 3.8, glucose 439 and creatinine 3.93. Initial Hs Troponin 1437 with repeat of 6550. Negative for COVID and Influenza. CXR showing increased vascular congestion and interstitial edema consistent with volume overload. EKG showing sinus tachycardia, HR 113 with ST depression along the inferior and lateral leads. She has been started on IV Heparin and was accepted in transfer to [facility] by the overnight Cardiology fellow.    Past Medical History:  Diagnosis Date   Anemia    Anxiety    Asthma    CAD (coronary artery disease)  Multivessel s/p CABG 2005, numerous PCIs since that time and documented graft disease   Carotid artery disease (HCC)    R CEA   ESRD on hemodialysis ([place])    Essential hypertension    Gout    History of blood transfusion    Hyperlipidemia    Hypothyroidism    Myocardial infarction [facility])    PAD (peripheral artery disease) ([place])    Dr. Kellie Simmering   Pneumonia 09/2019, 11/2019   S/P angioplasty with stent- DES to mRCA and to LIMA to LAD with DES 04/09/18.   04/10/2018   SBO  (small bowel obstruction) ([place]) 2011   Status post lysis of adhesions & hernia repair   Sinus bradycardia    Type 2 diabetes mellitus ([place])    Umbilical hernia     Past Surgical History:  Procedure Laterality Date   AORTIC ARCH ANGIOGRAPHY N/A 08/02/2020   Procedure: AORTIC ARCH ANGIOGRAPHY;  Surgeon: Waynetta Sandy, MD;  Location: [place] CV LAB;  Service: Cardiovascular;  Laterality: N/A;  Lt upper extermity   AV FISTULA PLACEMENT Left 06/29/2020   Procedure: LEFT ARM ARTERIOVENOUS (AV) FISTULA;  Surgeon: Waynetta Sandy, MD;  Location: [place];  Service: Vascular;  Laterality: Left;  ARM   AV FISTULA PLACEMENT Right 05/11/2021   Procedure: RIGHT BRACHIOCEPHALIC ARTERIOVENOUS (AV) FISTULA CREATION;  Surgeon: Waynetta Sandy, MD;  Location: [facility] OR;  Service: Vascular;  Laterality: Right;   [place]  2010   CORONARY ARTERY BYPASS GRAFT  2005   CORONARY BALLOON ANGIOPLASTY N/A 05/31/2017   Procedure: CORONARY BALLOON ANGIOPLASTY;  Surgeon: Jettie Booze, MD;  Location: [place] CV LAB;  Service: Cardiovascular;  Laterality: N/A;   CORONARY BALLOON ANGIOPLASTY N/A 06/14/2021   Procedure: CORONARY BALLOON ANGIOPLASTY;  Surgeon: Jettie Booze, MD;  Location: [place] CV LAB;  Service: Cardiovascular;  Laterality: N/A;   CORONARY STENT INTERVENTION N/A 05/31/2017   Procedure: CORONARY STENT INTERVENTION;  Surgeon: Jettie Booze, MD;  Location: [place] CV LAB;  Service: Cardiovascular;  Laterality: N/A;   CORONARY STENT INTERVENTION N/A 04/09/2018   Procedure: CORONARY STENT INTERVENTION;  Surgeon: Jettie Booze, MD;  Location: [place] CV LAB;  Service: Cardiovascular;  Laterality: N/A;  SVG RCA   CORONARY STENT INTERVENTION N/A 01/10/2021   Procedure: CORONARY STENT INTERVENTION;  Surgeon: Leonie Man, MD;  Location: [place] CV LAB;  Service: Cardiovascular;  Laterality: N/A;    CORONARY/GRAFT ACUTE MI REVASCULARIZATION N/A 02/24/2021   Procedure: Coronary/Graft Acute MI Revascularization;  Surgeon: Jettie Booze, MD;  Location: [place] CV LAB;  Service: Cardiovascular;  Laterality: N/A;   ENDARTERECTOMY Right 04/18/2013   Procedure: ENDARTERECTOMY CAROTID;  Surgeon: Mal Misty, MD;  Location: [place];  Service: Vascular;  Laterality: Right;   HERNIA REPAIR  1989   Incisional hernia repair x2  03/04/2010   Laparoscopic with 35cm mesh by Dr Ronnald Collum   IR [place] CV LINE RIGHT  06/21/2020   IR US GUIDE VASC ACCESS RIGHT  06/21/2020   LEFT HEART CATH AND CORS/GRAFTS ANGIOGRAPHY N/A 05/31/2017   Procedure: LEFT HEART CATH AND CORS/GRAFTS ANGIOGRAPHY;  Surgeon: Jettie Booze, MD;  Location: [place] CV LAB;  Service: Cardiovascular;  Laterality: N/A;   LEFT HEART CATH AND CORS/GRAFTS ANGIOGRAPHY N/A 04/08/2018   Procedure: LEFT HEART CATH AND CORS/GRAFTS ANGIOGRAPHY;  Surgeon: Jettie Booze, MD;  Location: [place] CV LAB;  Service: Cardiovascular;  Laterality: N/A;  LEFT HEART CATH AND CORS/GRAFTS ANGIOGRAPHY N/A 06/22/2020   Procedure: LEFT HEART CATH AND CORS/GRAFTS ANGIOGRAPHY;  Surgeon: Belva Crome, MD;  Location: [place] CV LAB;  Service: Cardiovascular;  Laterality: N/A;   LEFT HEART CATH AND CORS/GRAFTS ANGIOGRAPHY N/A 01/10/2021   Procedure: LEFT HEART CATH AND CORS/GRAFTS ANGIOGRAPHY;  Surgeon: Leonie Man, MD;  Location: [place] CV LAB;  Service: Cardiovascular;  Laterality: N/A;   LEFT HEART CATH AND CORS/GRAFTS ANGIOGRAPHY N/A 02/24/2021   Procedure: LEFT HEART CATH AND CORS/GRAFTS ANGIOGRAPHY;  Surgeon: Jettie Booze, MD;  Location: [place] CV LAB;  Service: Cardiovascular;  Laterality: N/A;   LEFT HEART CATH AND CORS/GRAFTS ANGIOGRAPHY N/A 06/14/2021   Procedure: LEFT HEART CATH AND CORS/GRAFTS ANGIOGRAPHY;  Surgeon: Jettie Booze, MD;  Location: [place] CV LAB;  Service: Cardiovascular;   Laterality: N/A;   LEFT HEART CATHETERIZATION WITH CORONARY ANGIOGRAM N/A 12/19/2012   Procedure: LEFT HEART CATHETERIZATION WITH CORONARY ANGIOGRAM;  Surgeon: Josue Hector, MD;  Location: [facility] CATH LAB;  Service: Cardiovascular;  Laterality: N/A;   LEFT HEART CATHETERIZATION WITH CORONARY/GRAFT ANGIOGRAM N/A 04/19/2013   Procedure: LEFT HEART CATHETERIZATION WITH Beatrix Fetters;  Surgeon: Lorretta Harp, MD;  Location: [facility] CATH LAB;  Service: Cardiovascular;  Laterality: N/A;   LIGATION OF ARTERIOVENOUS  FISTULA Left 09/15/2020   Procedure: LIGATION OF LEFT ARM ARTERIOVENOUS  FISTULA;  Surgeon: Waynetta Sandy, MD;  Location: [place];  Service: Vascular;  Laterality: Left;   PATCH ANGIOPLASTY Right 04/18/2013   Procedure: PATCH ANGIOPLASTY Right Internal Carotid Artery;  Surgeon: Mal Misty, MD;  Location: [place];  Service: Vascular;  Laterality: Right;   PERCUTANEOUS CORONARY STENT INTERVENTION (PCI-S) Right 12/19/2012   Procedure: PERCUTANEOUS CORONARY STENT INTERVENTION (PCI-S);  Surgeon: Josue Hector, MD;  Location: [facility] CATH LAB;  Service: Cardiovascular;  Laterality: Right;   PERIPHERAL VASCULAR INTERVENTION Left 08/02/2020   Procedure: PERIPHERAL VASCULAR INTERVENTION;  Surgeon: Waynetta Sandy, MD;  Location: [place] CV LAB;  Service: Cardiovascular;  Laterality: Left;  Left subclavian   PERIPHERAL VASCULAR INTERVENTION  02/24/2021   Procedure: PERIPHERAL VASCULAR INTERVENTION;  Surgeon: Marty Heck, MD;  Location: [place] CV LAB;  Service: Vascular;;   SHOULDER SURGERY       Medications Prior to Admission: Prior to Admission medications   Medication Sig Start Date End Date Taking? Authorizing Provider  albuterol (PROVENTIL) (2.5 MG/3ML) 0.083% nebulizer solution Take 3 mLs (2.5 mg total) by nebulization every 2 (two) hours as needed for wheezing. 07/15/21   Kathie Dike, MD  Albuterol Sulfate 108 (90 Base) MCG/ACT AEPB Inhale 2 puffs into the  lungs every 6 (six) hours as needed (Shortness of breath).    [provider]  allopurinol (ZYLOPRIM) 100 MG tablet Take one tablet after hemodialysis, on Tuesday, Thursday and Saturday. Patient taking differently: Take 100 mg by mouth Every Tuesday,Thursday,and Saturday with dialysis. After dialysis 06/30/20   Arrien, Jimmy Picket, MD  ALPRAZolam Duanne Moron) 0.5 MG tablet Take 0.25-0.5 mg by mouth See admin instructions. Take 0.25 in the morning and evening and 0.5 mg at bedtime    [provider]  aspirin EC 81 MG tablet Take 81 mg by mouth daily.     [provider]  atorvastatin (LIPITOR) 80 MG tablet Take 1 tablet (80 mg total) by mouth every evening. Patient taking differently: Take 80 mg by mouth at bedtime. 12/19/19   Murlean Iba, MD  budesonide-formoterol (SYMBICORT) 80-4.5 MCG/ACT inhaler Take 2  puffs first thing in am and then another 2 puffs about 12 hours later. 07/22/21   Tanda Rockers, MD  clopidogrel (PLAVIX) 75 MG tablet Take 1 tablet (75 mg total) by mouth daily. 08/27/18   Satira Sark, MD  cyclobenzaprine (FLEXERIL) 5 MG tablet Take 1 tablet (5 mg total) by mouth 2 (two) times daily with a meal. Patient not taking: Reported on 08/25/2021 06/17/21   Domenic Polite, MD  diclofenac Sodium (VOLTAREN) 1 % GEL Apply 2 g topically daily as needed (Pain). 06/24/21   [provider]  ezetimibe (ZETIA) 10 MG tablet Take 1 tablet (10 mg total) by mouth daily. Patient taking differently: Take 10 mg by mouth at bedtime. 02/25/21   Bhagat, Crista Luria, PA  ferric citrate (AURYXIA) 1 GM 210 MG(Fe) tablet Take 420 mg by mouth 3 (three) times daily with meals.    [provider]  gabapentin (NEURONTIN) 400 MG capsule Take 1 capsule (400 mg total) by mouth at bedtime. 06/17/21   Domenic Polite, MD  ipratropium (ATROVENT HFA) 17 MCG/ACT inhaler Inhale 2 puffs into the lungs every 4 (four) hours as needed for wheezing.     [provider]   Iron Sucrose (VENOFER IV) Inject 50 mLs into the vein once a week. At dialysis    [provider]  levothyroxine (SYNTHROID) 125 MCG tablet Take 125 mcg by mouth daily before breakfast. 07/25/21   [provider]  Methoxy PEG-Epoetin Beta (MIRCERA IJ) Mircera 07/30/21 07/29/22  [provider]  midodrine (PROAMATINE) 10 MG tablet Take 1 tablet (10 mg total) by mouth 2 (two) times daily with a meal. 06/17/21   Domenic Polite, MD  multivitamin (RENA-VIT) TABS tablet Take 1 tablet by mouth at bedtime.    [provider]  nitroGLYCERIN (NITROSTAT) 0.4 MG SL tablet Place 1 tablet (0.4 mg total) under the tongue every 5 (five) minutes x 3 doses as needed for chest pain. 02/25/21   Bhagat, Bhavinkumar, PA  NOVOLIN 70/30 KWIKPEN (70-30) 100 UNIT/ML KwikPen Inject 35 Units into the skin in the morning and at bedtime. Patient taking differently: Inject 24 Units into the skin in the morning and at bedtime. 07/15/21   Kathie Dike, MD  ondansetron (ZOFRAN) 8 MG tablet Take 8 mg by mouth every 8 (eight) hours as needed for nausea. 07/12/20   [provider]  [facility] ULTRA test strip Check blood glucose THREE TIMES DAILY 03/31/21   [provider]  pantoprazole (PROTONIX) 40 MG tablet Take 1 tablet (40 mg total) by mouth 2 (two) times daily. 06/17/20   Barton Dubois, MD  ranolazine (RANEXA) 500 MG 12 hr tablet Take 1 tablet (500 mg total) by mouth at bedtime. 06/17/21   Domenic Polite, MD  VITAMIN D PO Take 50 mcg by mouth See admin instructions. Tue, thur, sat at dialysis    [provider]  Vitamin D, Ergocalciferol, (DRISDOL) 1.25 MG (50000 UNIT) CAPS capsule Take 50,000 Units by mouth every Sunday.  12/31/19   [provider]     Allergies:    Allergies  Allergen Reactions   Penicillins Other (See Comments)    REACTION: Unknown, told as a child Has patient had a PCN reaction causing immediate rash, facial/tongue/throat swelling, SOB  or lightheadedness with hypotension: Unknown Has patient had a PCN reaction causing severe rash involving mucus membranes or skin necrosis: Unknown Has patient had a PCN reaction that required hospitalization: Unknown Has patient had a PCN reaction occurring within the last 10  years: No If all of the above answers are "NO", then may proceed with Cephalosporin use.    Beta Adrenergic Blockers Other (See Comments)    Hypotension    Social History:   Social History   Socioeconomic History   Marital status: Divorced    Spouse name: Not on file   Number of children: Not on file   Years of education: Not on file   Highest education level: Not on file  Occupational History   Occupation: Disabled  Tobacco Use   Smoking status: Former    Packs/day: 1.00    Years: 20.00    Pack years: 20.00    Types: Cigarettes    Quit date: 12/10/2012    Years since quitting: 8.7   Smokeless tobacco: Never   Tobacco comments:    Not currently smoking   Vaping Use   Vaping Use: Never used  Substance and Sexual Activity   Alcohol use: No    Alcohol/week: 0.0 standard drinks   Drug use: No   Sexual activity: Not Currently  Other Topics Concern   Not on file  Social History Narrative   Lives with mother.   Social Determinants of Health   Financial Resource Strain: Not on file  Food Insecurity: Not on file  Transportation Needs: Not on file  Physical Activity: Not on file  Stress: Not on file  Social Connections: Not on file  Intimate Partner Violence: Not on file   Family History:   The patient's family history includes AAA (abdominal aortic aneurysm) in her father; Diabetes in her mother; Heart disease in her brother and mother; Hyperlipidemia in her mother and son; Hypertension in her brother, father, mother, and son; Thyroid disease in her father.    ROS:  Please see the history of present illness.  All other ROS reviewed and negative.     Physical Exam/Data:   Vitals:   08/26/21  0407 08/26/21 0610 08/26/21 0747 08/26/21 0802  BP: (!) 122/57 92/62 (!) 83/61 (!) 81/68  Pulse: (!) 52 100 90 94  Resp: 20 (!) 21 19 20   Temp: (!) 97.5 F (36.4 C)  97.6 F (36.4 C)   TempSrc: Oral  Oral   SpO2: 98% 99% 100% 100%  Weight:      Height:       No intake or output data in the 24 hours ending 08/26/21 0853 Last 3 Weights 08/26/2021 08/25/2021 08/25/2021  Weight (lbs) 210 lb 210 lb 210 lb 1.6 oz  Weight (kg) 95.255 kg 95.255 kg 95.3 kg     Body mass index is 34.95 kg/m.  General: Pleasant female appearing in no acute distress. HEENT: normal Neck: no JVD Vascular: No carotid bruits; Distal pulses 2+ bilaterally   Cardiac:  normal S1, S2; RRR with occasional ectopic beats.  Lungs: mildly decreased breath sounds along bases bilaterally.  Abd: soft, nontender, no hepatomegaly  Ext: no pitting edema Musculoskeletal:  No deformities, BUE and BLE strength normal and equal Skin: warm and dry  Neuro:  CNs 2-12 intact, no focal abnormalities noted Psych:  Normal affect    EKG:  The ECG that was done was personally reviewed and demonstrates sinus tachycardia, HR 113 with ST depression along the inferior and lateral leads.   Relevant CV Studies:  Echocardiogram: 05/2021 IMPRESSIONS     1. Poor endocardial visualization even with contrast Appears to be some  inferior basal wall hypokineis on contrast images . Left ventricular  ejection fraction, by estimation, is 50 to  55%. The left ventricle has low  normal function. The left ventricle  demonstrates regional wall motion abnormalities (see scoring  diagram/findings for description). There is moderate left ventricular  hypertrophy. Left ventricular diastolic parameters are consistent with  Grade II diastolic dysfunction  (pseudonormalization). Elevated left ventricular end-diastolic pressure.   2. Right ventricular systolic function is normal. The right ventricular  size is normal.   3. Left atrial size was mildly  dilated.   4. The mitral valve is abnormal. Trivial mitral valve regurgitation. No  evidence of mitral stenosis.   5. The aortic valve is tricuspid. Aortic valve regurgitation is not  visualized. Mild to moderate aortic valve sclerosis/calcification is  present, without any evidence of aortic stenosis.   6. The inferior vena cava is normal in size with greater than 50%  respiratory variability, suggesting right atrial pressure of 3 mmHg.   Cardiac Catheterization: 05/2021   Ost LM to Dist LM lesion is 95% stenosed-severe restenosis   Prox LAD to Mid LAD lesion is 100% stenosed.   Ost LAD to Prox LAD lesion is 100% stenosed.  LIMA to LAD is patent.  Ostial LIMA stent with 30% in-stent restenosis.   Ost Cx to Mid Cx lesion is 99% stenosed.  SVG to OM/LPDA is occluded.   1st Mrg-1 lesion is 99% stenosed.   1st Mrg-2 lesion is 100% stenosed.   Origin to Prox Graft lesion is 30% stenosed.   Prox RCA lesion is 100% stenosed.  Severe in-stent restenosis in the proximal graft.   Prox Graft-1 lesion is 90% stenosed.   Scoring balloon angioplasty was performed using a 3.5 mm core flex balloon.  This was followed with angioplasty by [facility] [place] [id number].   Post intervention, there is a 0% residual stenosis.   LIMA and is normal in caliber.   LV end diastolic pressure is mildly elevated.   There is no aortic valve stenosis.   Severe three-vessel coronary artery disease.  Culprit for this presentation was likely the severe in-stent restenosis in the SVG to RCA.  Known in-stent restenosis of prior left main stent. Patent ostial LIMA stent with only 30% in-stent restenosis.     Mild volume overload.  The patient is due for dialysis.  Laboratory Data:  High Sensitivity Troponin:   Recent Labs  Lab 08/25/21 0704 08/25/21 0840 08/26/21 0340 08/26/21 0608  TROPONINIHS 73* 95* 1,437* 6,550*      Chemistry Recent Labs  Lab 08/25/21 0704 08/26/21 0340  NA 135 135  K 5.0 3.8  CL 96* 93*   CO2 26 30  GLUCOSE 509* 439*  BUN 37* 25*  CREATININE 6.16* 3.93*  CALCIUM 10.2 9.9  GFRNONAA 7* 12*  ANIONGAP 13 12    Recent Labs  Lab 08/24/21 2233 08/26/21 0340  PROT 7.0 7.4  ALBUMIN 3.5 3.7  AST 21 38  ALT 17 23  ALKPHOS 88 95  BILITOT 0.4 0.4   Lipids No results for input(s): CHOL, TRIG, HDL, LABVLDL, LDLCALC, CHOLHDL in the last 168 hours. Hematology Recent Labs  Lab 08/25/21 0704 08/26/21 0340  WBC 9.0 18.3*  RBC 3.48* 3.63*  HGB 12.0 12.7  HCT 37.7 39.3  MCV 108.3* 108.3*  MCH 34.5* 35.0*  MCHC 31.8 32.3  RDW 14.3 14.3  PLT 158 215   Thyroid No results for input(s): TSH, FREET4 in the last 168 hours. BNP Recent Labs  Lab 08/25/21 0704  BNP 1,086.0*    DDimer  Recent Labs  Lab 08/25/21 [phone]  DDIMER 0.49     Radiology/Studies:  DG Chest Port 1 View  Result Date: 08/26/2021 CLINICAL DATA:  Chest pain and shortness of breath EXAM: PORTABLE CHEST 1 VIEW COMPARISON:  08/25/2021 FINDINGS: Cardiac shadow is stable. Postsurgical changes are noted. Dialysis catheter is noted. Increased vascular congestion and interstitial edema is noted when compared with the previous day likely related to volume overload. No focal infiltrate is seen. Left subclavian stent is again noted. IMPRESSION: Increased vascular congestion and interstitial edema consistent with volume overload. Electronically Signed   By: Inez Catalina M.D.   On: 08/26/2021 02:23     Assessment and Plan:   1. NSTEMI - Presents with worsening chest pain and dyspnea on exertion for the past week and this is her third visit to the ED within the past few days but enzymes are significantly elevated at this time, trending up to 6550 on most recent check. Her EKG does show significant ST depression along the inferior and lateral leads. - She was already accepted in transfer to [facility] by the overnight cardiology fellow and is currently preparing for transfer by [place]. Reviewed with the patient she  would benefit from a re-look catheterization given her significant CAD and elevated enzymes. In agreement to proceed. Will add to the cath board for later today but will need for an MD to see the patient once at [facility].  - Continue IV Heparin, ASA 81mg  daily, Plavix 75mg  daily, Ranexa 500mg  once daily (unable to tolerate BID dosing due to hypotension), Atorvastatin 80mg  daily and Zetia 10mg  daily. No BB given SBP in the 80's.   2. CAD - She has known complex CAD with CABG in 2005 with multiple PCI's since and most recent being in 05/2021 with cath showing ISR of stent in the SVG-RCA and treated with angioplasty. - Will plan for a repeat catheterization as outlined above. Continue IV Heparin, ASA, Plavix, Ranexa, Atorvastatin and Zetia.   3. Hypotension - SBP currently in the 80's but she did receive Dilaudid and SL NTG this AM. Asymptomatic currently. On Midodrine 10mg  BID as an outpatient due to significant orthostatic hypotension and will order.   4. HLD - Will recheck FLP. Remains on Atorvastatin and Zetia.   5. IDDM - Glucose > 400 on admission. Will continue PTA Novolin 24 units BID. Will also order SSI. Would recommend consulting the Hospitalist upon arrival to [facility] for assistance with her DM and COPD this admission (on the Hospitalist service during her prior admissions).   6. COPD - Reports having a chronic cough and worsening dyspnea as outlined above. Will continue her PTA inhalers. Followed by [facility] as an outpatient.   7. ESRD - On HD - Tuesday, Thursday, Saturday schedule. Will need Nephrology consult once at [facility]. She did have HD on 08/25/2021.   The patient was transferred to [facility] prior to evaluation by an MD here at [facility] as she already has an assigned bed on 2C. She has been tentatively added to the cath board for later today. I have made the Card Master aware of her transfer and that an MD will need to see the patient prior to her cath. She  was accepted to the Cardiology service by the overnight fellow but has multiple medical issues. Will need Hospitalist Consult to assist with her IDDM and COPD along with Nephrology consult to arrange for HD while at [facility].    Risk Assessment/Risk Scores:    TIMI Risk Score for  Unstable Angina or Non-ST Elevation MI:   The patient's TIMI risk score is 6, which indicates a 41% risk of all cause mortality, new or recurrent myocardial infarction or need for urgent revascularization in the next 14 days.   Severity of Illness: The appropriate patient status for this patient is INPATIENT. Inpatient status is judged to be reasonable and necessary in order to provide the required intensity of service to ensure the patient's safety. The patient's presenting symptoms, physical exam findings, and initial radiographic and laboratory data in the context of their chronic comorbidities is felt to place them at high risk for further clinical deterioration. Furthermore, it is not anticipated that the patient will be medically stable for discharge from the hospital within 2 midnights of admission.   * I certify that at the point of admission it is my clinical judgment that the patient will require inpatient hospital care spanning beyond 2 midnights from the point of admission due to high intensity of service, high risk for further deterioration and high frequency of surveillance required.*   For questions or updates, please contact [place] Please consult [email or web address] for contact info under     Signed, Erma Heritage, PA-C  08/26/2021 8:53 AM   Personally seen and examined. Agree with above.  65 year old here for heart catheterization in the setting of non-ST elevation myocardial infarction with troponin of 6500.  Known complex coronary artery disease with CABG in 2005 with multiple PCI's.  Currently on IV heparin.  Antiplatelet as well.  Blood pressures have been soft for quite some time.  She  did have some back pain and was asking for some Dilaudid.  Did not feel comfortable given her low normal blood pressures.  Awaiting heart catheterization.  Candee Furbish, MD

## 2021-08-26 NOTE — ED Notes (Signed)
Multiple unsuccessful attempts made to draw blood by RN and Lab tech.  Dr. Sedonia Small made aware.

## 2021-08-26 NOTE — Plan of Care (Signed)
Discussed with ED provider Dr. Sedonia Small.  EKG with diffuse ST depression.  Chest pain on arrival now chest pain free on a nitroglycerin infusion.  Start ASA/Heparin/statin.  Do not feed.  If recurrent chest pain, worsening respiratory status, or ventricular arrhythmia please contact for more urgent transfer.  Accepted in transfer to cardiology under Dr. Marlou Porch.  Delton Prairie, MD Cardiology Fellow [facility]

## 2021-08-26 NOTE — Interval H&P Note (Signed)
History and Physical Interval Note:  08/26/2021 4:49 PM  Donna Howe  has presented today for surgery, with the diagnosis of nstemi.  The various methods of treatment have been discussed with the patient and family. After consideration of risks, benefits and other options for treatment, the patient has consented to  Procedure(s): LEFT HEART CATH AND CORS/GRAFTS ANGIOGRAPHY (N/A)  PERCUTANEOUS CORONARY INTERVENTION  as a surgical intervention.  The patient's history has been reviewed, patient examined, no change in status, stable for surgery.  I have reviewed the patient's chart and labs.  Questions were answered to the patient's satisfaction.    Cath Lab Visit (complete for each Cath Lab visit)  Clinical Evaluation Leading to the Procedure:   ACS: Yes.    Non-ACS:    Anginal Classification: CCS IV  Anti-ischemic medical therapy: Maximal Therapy (2 or more classes of medications)  Non-Invasive Test Results: No non-invasive testing performed  Prior CABG: Previous CABG    Donna Howe

## 2021-08-26 NOTE — ED Notes (Signed)
Pt sitting up in bed, pt talking on the phone, pt reports 7/10 chest and back pain.  Pt states that her blood pressure normally runs low.  MD notified of bp, pt awaits transport.

## 2021-08-26 NOTE — ED Notes (Signed)
Date and time results received: 08/26/21 0653  Test: Troponin  Critical Value: 6,550  Name of Provider Notified: Dr. Sedonia Small   Orders Received? Or Actions Taken?: Acknowledged

## 2021-08-26 NOTE — ED Provider Notes (Signed)
[facility] Emergency Department Provider Note MRN:  [id number]  Arrival date & time: 08/26/21     Chief Complaint   Chest Pain   History of Present Illness   Donna Howe is a 65 y.o. year-old female with a history of CAD, PAD presenting to the ED with chief complaint of chest pain.  Worsening shortness of breath over the past 4 days associated with chest pressure.  Much worse this evening, patient having a lot of trouble breathing.  Moderate to severe.  Symptoms constant.  Denies any recent fever or cough, no abdominal pain, no other complaints.  Review of Systems  A complete 10 system review of systems was obtained and all systems are negative except as noted in the HPI and PMH.   Patient's Health History    Past Medical History:  Diagnosis Date   Anemia    Anxiety    Asthma    CAD (coronary artery disease)    Multivessel s/p CABG 2005, numerous PCIs since that time and documented graft disease   Carotid artery disease ([place])    R CEA   ESRD on hemodialysis ([place])    Essential hypertension    Gout    History of blood transfusion    Hyperlipidemia    Hypothyroidism    Myocardial infarction [facility])    PAD (peripheral artery disease) ([place])    Dr. Kellie Simmering   Pneumonia 09/2019, 11/2019   S/P angioplasty with stent- DES to mRCA and to LIMA to LAD with DES 04/09/18.   04/10/2018   SBO (small bowel obstruction) ([place]) 2011   Status post lysis of adhesions & hernia repair   Sinus bradycardia    Type 2 diabetes mellitus ([place])    Umbilical hernia     Past Surgical History:  Procedure Laterality Date   AORTIC ARCH ANGIOGRAPHY N/A 08/02/2020   Procedure: AORTIC ARCH ANGIOGRAPHY;  Surgeon: Waynetta Sandy, MD;  Location: [place] CV LAB;  Service: Cardiovascular;  Laterality: N/A;  Lt upper extermity   AV FISTULA PLACEMENT Left 06/29/2020   Procedure: LEFT ARM ARTERIOVENOUS (AV) FISTULA;  Surgeon: Waynetta Sandy, MD;  Location: [place];   Service: Vascular;  Laterality: Left;  ARM   AV FISTULA PLACEMENT Right 05/11/2021   Procedure: RIGHT BRACHIOCEPHALIC ARTERIOVENOUS (AV) FISTULA CREATION;  Surgeon: Waynetta Sandy, MD;  Location: [facility] OR;  Service: Vascular;  Laterality: Right;   [place]  2010   CORONARY ARTERY BYPASS GRAFT  2005   CORONARY BALLOON ANGIOPLASTY N/A 05/31/2017   Procedure: CORONARY BALLOON ANGIOPLASTY;  Surgeon: Jettie Booze, MD;  Location: [place] CV LAB;  Service: Cardiovascular;  Laterality: N/A;   CORONARY BALLOON ANGIOPLASTY N/A 06/14/2021   Procedure: CORONARY BALLOON ANGIOPLASTY;  Surgeon: Jettie Booze, MD;  Location: [place] CV LAB;  Service: Cardiovascular;  Laterality: N/A;   CORONARY STENT INTERVENTION N/A 05/31/2017   Procedure: CORONARY STENT INTERVENTION;  Surgeon: Jettie Booze, MD;  Location: [place] CV LAB;  Service: Cardiovascular;  Laterality: N/A;   CORONARY STENT INTERVENTION N/A 04/09/2018   Procedure: CORONARY STENT INTERVENTION;  Surgeon: Jettie Booze, MD;  Location: [place] CV LAB;  Service: Cardiovascular;  Laterality: N/A;  SVG RCA   CORONARY STENT INTERVENTION N/A 01/10/2021   Procedure: CORONARY STENT INTERVENTION;  Surgeon: Leonie Man, MD;  Location: [place] CV LAB;  Service: Cardiovascular;  Laterality: N/A;   CORONARY/GRAFT ACUTE MI REVASCULARIZATION N/A 02/24/2021  Procedure: Coronary/Graft Acute MI Revascularization;  Surgeon: Jettie Booze, MD;  Location: [place] CV LAB;  Service: Cardiovascular;  Laterality: N/A;   ENDARTERECTOMY Right 04/18/2013   Procedure: ENDARTERECTOMY CAROTID;  Surgeon: Mal Misty, MD;  Location: [place];  Service: Vascular;  Laterality: Right;   HERNIA REPAIR  1989   Incisional hernia repair x2  03/04/2010   Laparoscopic with 35cm mesh by Dr Ronnald Collum   IR [place] CV LINE RIGHT  06/21/2020   IR US GUIDE VASC ACCESS RIGHT  06/21/2020   LEFT HEART  CATH AND CORS/GRAFTS ANGIOGRAPHY N/A 05/31/2017   Procedure: LEFT HEART CATH AND CORS/GRAFTS ANGIOGRAPHY;  Surgeon: Jettie Booze, MD;  Location: [place] CV LAB;  Service: Cardiovascular;  Laterality: N/A;   LEFT HEART CATH AND CORS/GRAFTS ANGIOGRAPHY N/A 04/08/2018   Procedure: LEFT HEART CATH AND CORS/GRAFTS ANGIOGRAPHY;  Surgeon: Jettie Booze, MD;  Location: [place] CV LAB;  Service: Cardiovascular;  Laterality: N/A;   LEFT HEART CATH AND CORS/GRAFTS ANGIOGRAPHY N/A 06/22/2020   Procedure: LEFT HEART CATH AND CORS/GRAFTS ANGIOGRAPHY;  Surgeon: Belva Crome, MD;  Location: [place] CV LAB;  Service: Cardiovascular;  Laterality: N/A;   LEFT HEART CATH AND CORS/GRAFTS ANGIOGRAPHY N/A 01/10/2021   Procedure: LEFT HEART CATH AND CORS/GRAFTS ANGIOGRAPHY;  Surgeon: Leonie Man, MD;  Location: [place] CV LAB;  Service: Cardiovascular;  Laterality: N/A;   LEFT HEART CATH AND CORS/GRAFTS ANGIOGRAPHY N/A 02/24/2021   Procedure: LEFT HEART CATH AND CORS/GRAFTS ANGIOGRAPHY;  Surgeon: Jettie Booze, MD;  Location: [place] CV LAB;  Service: Cardiovascular;  Laterality: N/A;   LEFT HEART CATH AND CORS/GRAFTS ANGIOGRAPHY N/A 06/14/2021   Procedure: LEFT HEART CATH AND CORS/GRAFTS ANGIOGRAPHY;  Surgeon: Jettie Booze, MD;  Location: [place] CV LAB;  Service: Cardiovascular;  Laterality: N/A;   LEFT HEART CATHETERIZATION WITH CORONARY ANGIOGRAM N/A 12/19/2012   Procedure: LEFT HEART CATHETERIZATION WITH CORONARY ANGIOGRAM;  Surgeon: Josue Hector, MD;  Location: [facility] CATH LAB;  Service: Cardiovascular;  Laterality: N/A;   LEFT HEART CATHETERIZATION WITH CORONARY/GRAFT ANGIOGRAM N/A 04/19/2013   Procedure: LEFT HEART CATHETERIZATION WITH Beatrix Fetters;  Surgeon: Lorretta Harp, MD;  Location: [facility] CATH LAB;  Service: Cardiovascular;  Laterality: N/A;   LIGATION OF ARTERIOVENOUS  FISTULA Left 09/15/2020   Procedure: LIGATION OF LEFT ARM ARTERIOVENOUS  FISTULA;   Surgeon: Waynetta Sandy, MD;  Location: [place];  Service: Vascular;  Laterality: Left;   PATCH ANGIOPLASTY Right 04/18/2013   Procedure: PATCH ANGIOPLASTY Right Internal Carotid Artery;  Surgeon: Mal Misty, MD;  Location: [place];  Service: Vascular;  Laterality: Right;   PERCUTANEOUS CORONARY STENT INTERVENTION (PCI-S) Right 12/19/2012   Procedure: PERCUTANEOUS CORONARY STENT INTERVENTION (PCI-S);  Surgeon: Josue Hector, MD;  Location: [facility] CATH LAB;  Service: Cardiovascular;  Laterality: Right;   PERIPHERAL VASCULAR INTERVENTION Left 08/02/2020   Procedure: PERIPHERAL VASCULAR INTERVENTION;  Surgeon: Waynetta Sandy, MD;  Location: [place] CV LAB;  Service: Cardiovascular;  Laterality: Left;  Left subclavian   PERIPHERAL VASCULAR INTERVENTION  02/24/2021   Procedure: PERIPHERAL VASCULAR INTERVENTION;  Surgeon: Marty Heck, MD;  Location: [place] CV LAB;  Service: Vascular;;   SHOULDER SURGERY      Family History  Problem Relation Age of Onset   Diabetes Mother    Heart disease Mother        before age 26   Hyperlipidemia Mother    Hypertension Mother  Thyroid disease Father    Hypertension Father    AAA (abdominal aortic aneurysm) Father    Heart disease Brother        before age 88   Hypertension Brother    Hyperlipidemia Son    Hypertension Son     Social History   Socioeconomic History   Marital status: Divorced    Spouse name: Not on file   Number of children: Not on file   Years of education: Not on file   Highest education level: Not on file  Occupational History   Occupation: Disabled  Tobacco Use   Smoking status: Former    Packs/day: 1.00    Years: 20.00    Pack years: 20.00    Types: Cigarettes    Quit date: 12/10/2012    Years since quitting: 8.7   Smokeless tobacco: Never   Tobacco comments:    Not currently smoking   Vaping Use   Vaping Use: Never used  Substance and Sexual Activity   Alcohol use: No     Alcohol/week: 0.0 standard drinks   Drug use: No   Sexual activity: Not Currently  Other Topics Concern   Not on file  Social History Narrative   Lives with mother.   Social Determinants of Health   Financial Resource Strain: Not on file  Food Insecurity: Not on file  Transportation Needs: Not on file  Physical Activity: Not on file  Stress: Not on file  Social Connections: Not on file  Intimate Partner Violence: Not on file     Physical Exam   Vitals:   08/26/21 0405 08/26/21 0407  BP:  (!) 122/57  Pulse:  (!) 52  Resp: 18 20  Temp:  (!) 97.5 F (36.4 C)  SpO2:  98%    CONSTITUTIONAL: Ill-appearing, in moderate respiratory distress NEURO:  Alert and oriented x 3, no focal deficits EYES:  eyes equal and reactive ENT/NECK:  no LAD, no JVD CARDIO: Tachycardic rate, well-perfused, normal S1 and S2 PULM:  CTAB no wheezing or rhonchi GI/GU:  normal bowel sounds, non-distended, non-tender MSK/SPINE:  No gross deformities, no edema SKIN:  no rash, atraumatic PSYCH:  Appropriate speech and behavior  *Additional and/or pertinent findings included in MDM below  Diagnostic and Interventional Summary    EKG Interpretation  Date/Time:  08-26-2021 and 01: 39: 52 Ventricular Rate:  111 PR Interval:  169 QRS Duration: 106 QT Interval:  314 QTC Calculation: 427 R Axis:     Text Interpretation: Sinus rhythm, nonspecific conduction delay, global ischemia Confirmed by Dr. Gerlene Fee at 1:42 AM       Labs Reviewed  CBC - Abnormal; Notable for the following components:      Result Value   WBC 18.3 (*)    RBC 3.63 (*)    MCV 108.3 (*)    MCH 35.0 (*)    All other components within normal limits  COMPREHENSIVE METABOLIC PANEL - Abnormal; Notable for the following components:   Chloride 93 (*)    Glucose, Bld 439 (*)    BUN 25 (*)    Creatinine, Ser 3.93 (*)    GFR, Estimated 12 (*)    All other components within normal limits  CBG MONITORING, ED - Abnormal; Notable  for the following components:   Glucose-Capillary 477 (*)    All other components within normal limits  TROPONIN I (HIGH SENSITIVITY) - Abnormal; Notable for the following components:   Troponin I (High Sensitivity) 1,437 (*)  All other components within normal limits  RESP PANEL BY RT-PCR (FLU A&B, COVID) ARPGX2  PROTIME-INR  TROPONIN I (HIGH SENSITIVITY)    DG Chest Port 1 View  Final Result      Medications  HYDROmorphone (DILAUDID) injection 0.5 mg (0.5 mg Intravenous Given 08/26/21 0213)  nitroGLYCERIN (NITROSTAT) SL tablet 0.4 mg (0.4 mg Sublingual Given 08/26/21 0212)  aspirin chewable tablet 324 mg (324 mg Oral Given 08/26/21 8110)     Procedures  /  Critical Care .Critical Care Performed by: Maudie Flakes, MD Authorized by: Maudie Flakes, MD   Critical care provider statement:    Critical care time (minutes):  33   Critical care was necessary to treat or prevent imminent or life-threatening deterioration of the following conditions: NSTEMI.   Critical care was time spent personally by me on the following activities:  Development of treatment plan with patient or surrogate, discussions with consultants, evaluation of patient's response to treatment, examination of patient, ordering and review of laboratory studies, ordering and review of radiographic studies, ordering and performing treatments and interventions, pulse oximetry, re-evaluation of patient's condition and review of old charts  ED Course and Medical Decision Making  I have reviewed the triage vital signs, the nursing notes, and pertinent available records from the EMR.  Listed above are laboratory and imaging tests that I personally ordered, reviewed, and interpreted and then considered in my medical decision making (see below for details).  Patient presenting with moderate to severe shortness of breath, chest pain, seems to be in acute distress.  EKG with global ischemic findings.  Case discussed with Dr.  Paulita Fujita of cardiology, likely demand and exacerbation of prior morphologies.  Patient doing much better after nitro tablet and Dilaudid, awaiting troponin.     Troponin returns at 1400, clinically patient continues to look well compared to her arrival presentation.  Starting heparin, accepted for admission by cardiology.  Will transport to [employer].  Barth Kirks. Sedonia Small, [place] [email or web address]  Final Clinical Impressions(s) / ED Diagnoses     ICD-10-CM   1. NSTEMI (non-ST elevated myocardial infarction) [facility])  I21.4       ED Discharge Orders     None        Discharge Instructions Discussed with and Provided to Patient:   Discharge Instructions   None       Maudie Flakes, MD 08/26/21 [phone]

## 2021-08-26 NOTE — ED Triage Notes (Signed)
Pt c/o chest pain and SOB that started Tuesday, was seen here the past two days for the same. 98% on RA, no distress noted. EMS states CBG was 502 in route,

## 2021-08-26 NOTE — Progress Notes (Signed)
ANTICOAGULATION CONSULT NOTE - Follow Up Consult  Pharmacy Consult for Heparin Indication: chest pain/ACS  Allergies  Allergen Reactions   Penicillins Other (See Comments)    REACTION: Unknown, told as a child Has patient had a PCN reaction causing immediate rash, facial/tongue/throat swelling, SOB or lightheadedness with hypotension: Unknown Has patient had a PCN reaction causing severe rash involving mucus membranes or skin necrosis: Unknown Has patient had a PCN reaction that required hospitalization: Unknown Has patient had a PCN reaction occurring within the last 10 years: No If all of the above answers are "NO", then may proceed with Cephalosporin use.    Beta Adrenergic Blockers Other (See Comments)    Hypotension    Patient Measurements: Height: 5\' 5"  (165.1 cm) Weight: 95.3 kg (210 lb) IBW/kg (Calculated) : 57 Heparin Dosing Weight:  78.5 kg  Vital Signs: Temp: 97.6 F (36.4 C) (12/09 1106) Temp Source: Oral (12/09 1106) BP: 83/34 (12/09 1150) Pulse Rate: 90 (12/09 1150)  Labs: Recent Labs    08/24/21 2233 08/24/21 2233 08/25/21 0704 08/25/21 0840 08/26/21 0340 08/26/21 0608 08/26/21 1413  HGB 11.5*  --  12.0  --  12.7  --   --   HCT 36.5  --  37.7  --  39.3  --   --   PLT 157  --  158  --  215  --   --   LABPROT  --   --   --   --  13.0  --   --   INR  --   --   --   --  1.0  --   --   HEPARINUNFRC  --   --   --   --   --   --  0.35  CREATININE 5.62*  --  6.16*  --  3.93*  --   --   TROPONINIHS  --    < > 73* 95* 1,437* 6,550*  --    < > = values in this interval not displayed.    Estimated Creatinine Clearance: 16.7 mL/min (A) (by C-G formula based on SCr of 3.93 mg/dL (H)).  Assessment: Anticoag: NSTEMI. IV heparin. Initial hep level 0.35 in goal. Hgb 12.7. Plts WNL  Goal of Therapy:  Heparin level 0.3-0.7 units/ml Monitor platelets by anticoagulation protocol: Yes   Plan: Cath 12/9 Con't heparin infusion at 1100 units/hr Daily HL and  CBC   Tyrelle Raczka S. Alford Highland, PharmD, BCPS Clinical Staff Pharmacist [email or web address] Alford Highland, Daisean Brodhead Stillinger 08/26/2021,3:07 PM

## 2021-08-26 NOTE — TOC Initial Note (Signed)
Transition of Care [facility]) - Initial/Assessment Note    Patient Details  Name: Donna Howe MRN: [id number] Date of Birth: Aug 16, 1956  Transition of Care [facility]) CM/SW Contact:    Zenon Mayo, RN Phone Number: 08/26/2021, 3:57 PM  Clinical Narrative:                 NCM spoke with patient, she lives alone, she still drives and she is independent.  She states she goes to [employer] in [place] on [address], [place] and she also uses [employer].  Per MD WBC elevated, she is on po steroids, had fever yesterday, will get cxr, plan for one more day.  TOC will continue to follow for dc needs.   Expected Discharge Plan: Home/Self Care Barriers to Discharge: Continued Medical Work up   Patient Goals and CMS Choice Patient states their goals for this hospitalization and ongoing recovery are:: return home   Choice offered to / list presented to : NA  Expected Discharge Plan and Services Expected Discharge Plan: Home/Self Care In-house Referral: NA Discharge Planning Services: CM Consult Post Acute Care Choice: NA                     DME Agency: NA       HH Arranged: NA          Prior Living Arrangements/Services   Lives with:: Self Patient language and need for interpreter reviewed:: Yes Do you feel safe going back to the place where you live?: Yes      Need for Family Participation in Patient Care: No (Comment) Care giver support system in place?: No (comment)   Criminal Activity/Legal Involvement Pertinent to Current Situation/Hospitalization: No - Comment as needed  Activities of Daily Living      Permission Sought/Granted                  Emotional Assessment   Attitude/Demeanor/Rapport: Engaged Affect (typically observed): Appropriate Orientation: : Oriented to  Time, Oriented to Situation, Oriented to Place, Oriented to Self Alcohol / Substance Use: Not Applicable Psych Involvement: No (comment)  Admission diagnosis:  NSTEMI  (non-ST elevated myocardial infarction) [facility]) [I21.4] Patient Active Problem List   Diagnosis Date Noted   NSTEMI (non-ST elevated myocardial infarction) ([place]) 08/26/2021   COPD (chronic obstructive pulmonary disease) ([place]) 07/13/2021   Insulin dependent type 2 diabetes mellitus ([place])    Acute respiratory failure ([place]) 07/12/2021   Nausea 07/02/2021   Pulmonary edema 06/14/2021   Other fluid overload 03/01/2021   Stenosis of left subclavian artery (HCC)    Non-ST elevation (NSTEMI) myocardial infarction ([place]) 01/12/2021   Leukocytosis 01/09/2021   Elevated MCV 01/09/2021   Diabetic neuropathy ([place]) 01/09/2021   COVID-19 09/30/2020   Unspecified protein-calorie malnutrition ([place]) 07/05/2020   Allergy, unspecified, initial encounter 06/30/2020   Anaphylactic shock, unspecified, initial encounter 06/30/2020   Anemia in chronic kidney disease 06/30/2020   Coagulation defect, unspecified ([place]) 06/30/2020   Diarrhea, unspecified 06/30/2020   Encounter for immunization 06/30/2020   Iron deficiency anemia, unspecified 06/30/2020   Pain, unspecified 06/30/2020   PAD (peripheral artery disease) ([place]) 06/30/2020   Pruritus, unspecified 06/30/2020   Secondary hyperparathyroidism of renal origin ([place]) 06/30/2020   ESRD (end stage renal disease) ([place])    SOB (shortness of breath)    Chronic diastolic HF (heart failure) (HCC)    GERD (gastroesophageal reflux disease)    Acute renal failure superimposed on stage 5 chronic kidney disease, not on  chronic dialysis ([place]) 06/13/2020   Hyperglycemia due to diabetes mellitus ([place]) 06/13/2020   Pneumonia 12/17/2019   Cough variant asthma with component of UACS 11/20/2019   Elevated troponin I level 10/02/2019   Chest pain 10/01/2019   Unstable angina ([place]) 04/18/2018   Hypothyroidism (acquired) 04/18/2018   S/P angioplasty with stent- DES to [facility] and to LIMA to LAD with DES 04/09/18.   04/10/2018   Angina pectoris ([place]) 04/05/2018   Status post  coronary artery stent placement    Acute coronary syndrome ([place]) 05/31/2017   Acute chest pain    History of coronary artery disease    Diabetes mellitus with ESRD (end-stage renal disease) ([place]) 10/28/2013   Hypoglycemia associated with diabetes ([place]) 10/28/2013   Thrombocytopenia, unspecified ([place]) 10/28/2013   Hypokalemia 10/27/2013   Syncope 10/26/2013   Anemia 10/26/2013   AKI (acute kidney injury) ([place]) 10/26/2013   Fracture of toe of right foot 93/71/6967   Umbilical hernia    Carotid artery disease ([place])    Occlusion and stenosis of carotid artery without mention of cerebral infarction 04/15/2013   Hx of CABG    Ejection fraction    Atherosclerosis of native artery of extremity with intermittent claudication ([place]) 02/11/2013   Diabetes mellitus with renal manifestation ([place]) 01/21/2013   Bradycardia 01/03/2013   Chronic kidney disease (CKD), stage III (moderate) (HCC)    Gout    PROTEINURIA, MILD 01/18/2010   Obesity (BMI 30-39.9) 08/26/2009   Asthma, chronic, unspecified asthma severity, with acute exacerbation 08/26/2009   Mixed hyperlipidemia 03/31/2007   Essential hypertension 03/31/2007   CAD (coronary artery disease) 03/31/2007   PCP:  Curlene Labrum, MD Pharmacy:   [facility] [address] [place] [address] [place] [address] Phone: [phone] Fax: [phone]  [facility] - [place], [place] - [address] [place] [address] Phone: [phone] Fax: [phone]  [place], [place] - [address] [address] Phone: [phone] Fax: [phone]     Social Determinants of Health (SDOH) Interventions    Readmission Risk Interventions Readmission Risk Prevention Plan 08/26/2021 07/14/2021 06/15/2021  Transportation Screening Complete Complete Complete  Medication Review ([place])  Complete Complete -  PCP or Specialist appointment within 3-5 days of discharge Complete - Complete  HRI or Home Care Consult Complete Complete Complete  SW Recovery Care/Counseling Consult Complete Complete Complete  Palliative Care Screening Not Applicable Not Applicable Not Applicable  Skilled Nursing Facility Not Applicable Not Applicable Not Applicable  Some recent data might be hidden

## 2021-08-26 NOTE — Progress Notes (Signed)
ANTICOAGULATION CONSULT NOTE - Initial Consult  Pharmacy Consult for heparin Indication: chest pain/ACS  Allergies  Allergen Reactions   Penicillins Other (See Comments)    REACTION: Unknown, told as a child Has patient had a PCN reaction causing immediate rash, facial/tongue/throat swelling, SOB or lightheadedness with hypotension: Unknown Has patient had a PCN reaction causing severe rash involving mucus membranes or skin necrosis: Unknown Has patient had a PCN reaction that required hospitalization: Unknown Has patient had a PCN reaction occurring within the last 10 years: No If all of the above answers are "NO", then may proceed with Cephalosporin use.    Beta Adrenergic Blockers Other (See Comments)    Hypotension    Patient Measurements: Height: 5\' 5"  (165.1 cm) Weight: 95.3 kg (210 lb) IBW/kg (Calculated) : 57 Heparin Dosing Weight: 80kg  Vital Signs: Temp: 97.5 F (36.4 C) (12/09 0407) Temp Source: Oral (12/09 0407) BP: 122/57 (12/09 0407) Pulse Rate: 52 (12/09 0407)  Labs: Recent Labs    08/24/21 2233 08/25/21 0704 08/25/21 0840 08/26/21 0340  HGB 11.5* 12.0  --  12.7  HCT 36.5 37.7  --  39.3  PLT 157 158  --  215  LABPROT  --   --   --  13.0  INR  --   --   --  1.0  CREATININE 5.62* 6.16*  --  3.93*  TROPONINIHS  --  73* 95* 1,437*    Estimated Creatinine Clearance: 16.7 mL/min (A) (by C-G formula based on SCr of 3.93 mg/dL (H)).   Medical History: Past Medical History:  Diagnosis Date   Anemia    Anxiety    Asthma    CAD (coronary artery disease)    Multivessel s/p CABG 2005, numerous PCIs since that time and documented graft disease   Carotid artery disease ([place])    R CEA   ESRD on hemodialysis ([place])    Essential hypertension    Gout    History of blood transfusion    Hyperlipidemia    Hypothyroidism    Myocardial infarction [facility])    PAD (peripheral artery disease) ([place])    Dr. Kellie Simmering   Pneumonia 09/2019, 11/2019   S/P angioplasty with  stent- DES to mRCA and to LIMA to LAD with DES 04/09/18.   04/10/2018   SBO (small bowel obstruction) ([place]) 2011   Status post lysis of adhesions & hernia repair   Sinus bradycardia    Type 2 diabetes mellitus ([place])    Umbilical hernia     Assessment: 65yo female c/o CP/SOB, had been seen in ED previously this week for same but now w/ markedly elevated troponin (previously <100, now 1437), to begin heparin.  Goal of Therapy:  Heparin level 0.3-0.7 units/ml Monitor platelets by anticoagulation protocol: Yes   Plan:  Heparin 4000 units IV bolus x1 followed by infusion at 1100 units/hr and monitor heparin levels and CBC.  Wynona Neat, PharmD, BCPS  08/26/2021,5:36 AM

## 2021-08-26 NOTE — Progress Notes (Signed)
Right Femoral Arterial Sheath removed at 2330. Manual pressure held for 20 minutes, site level 0, patient educated about bedrest and restrictions. Primary RN notified.

## 2021-08-26 NOTE — Interval H&P Note (Signed)
History and Physical Interval Note:  08/26/2021 2:18 PM  Donna Howe  has presented today for surgery, with the diagnosis of nstemi.  The various methods of treatment have been discussed with the patient and family. After consideration of risks, benefits and other options for treatment, the patient has consented to  Procedure(s): LEFT HEART CATH AND CORS/GRAFTS ANGIOGRAPHY (N/A) as a surgical intervention.  The patient's history has been reviewed, patient examined, no change in status, stable for surgery.  I have reviewed the patient's chart and labs.  Questions were answered to the patient's satisfaction.     Sherren Mocha

## 2021-08-27 DIAGNOSIS — I214 Non-ST elevation (NSTEMI) myocardial infarction: Secondary | ICD-10-CM | POA: Diagnosis not present

## 2021-08-27 LAB — BASIC METABOLIC PANEL
Anion gap: 12 (ref 5–15)
BUN: 35 mg/dL — ABNORMAL HIGH (ref 8–23)
CO2: 21 mmol/L — ABNORMAL LOW (ref 22–32)
Calcium: 8.5 mg/dL — ABNORMAL LOW (ref 8.9–10.3)
Chloride: 96 mmol/L — ABNORMAL LOW (ref 98–111)
Creatinine, Ser: 5.27 mg/dL — ABNORMAL HIGH (ref 0.44–1.00)
GFR, Estimated: 9 mL/min — ABNORMAL LOW (ref >60)
Glucose, Bld: 233 mg/dL — ABNORMAL HIGH (ref 70–99)
Potassium: 5.6 mmol/L — ABNORMAL HIGH (ref 3.5–5.1)
Sodium: 129 mmol/L — ABNORMAL LOW (ref 135–145)

## 2021-08-27 LAB — CBC WITH DIFFERENTIAL/PLATELET
Abs Immature Granulocytes: 0.17 10*3/uL — ABNORMAL HIGH (ref 0.00–0.07)
Basophils Absolute: 0.1 10*3/uL (ref 0.0–0.1)
Basophils Relative: 0 %
Eosinophils Absolute: 0.1 10*3/uL (ref 0.0–0.5)
Eosinophils Relative: 1 %
HCT: 33.9 % — ABNORMAL LOW (ref 36.0–46.0)
Hemoglobin: 11.1 g/dL — ABNORMAL LOW (ref 12.0–15.0)
Immature Granulocytes: 1 %
Lymphocytes Relative: 14 %
Lymphs Abs: 2.1 10*3/uL (ref 0.7–4.0)
MCH: 34.7 pg — ABNORMAL HIGH (ref 26.0–34.0)
MCHC: 32.7 g/dL (ref 30.0–36.0)
MCV: 105.9 fL — ABNORMAL HIGH (ref 80.0–100.0)
Monocytes Absolute: 1.4 10*3/uL — ABNORMAL HIGH (ref 0.1–1.0)
Monocytes Relative: 10 %
Neutro Abs: 11 10*3/uL — ABNORMAL HIGH (ref 1.7–7.7)
Neutrophils Relative %: 74 %
Platelets: 161 10*3/uL (ref 150–400)
RBC: 3.2 MIL/uL — ABNORMAL LOW (ref 3.87–5.11)
RDW: 14.2 % (ref 11.5–15.5)
WBC: 14.9 10*3/uL — ABNORMAL HIGH (ref 4.0–10.5)
nRBC: 0.1 % (ref 0.0–0.2)

## 2021-08-27 LAB — LIPID PANEL
Cholesterol: 94 mg/dL (ref 0–200)
HDL: 44 mg/dL (ref >40)
LDL Cholesterol: 21 mg/dL (ref 0–99)
Total CHOL/HDL Ratio: 2.1 RATIO
Triglycerides: 144 mg/dL (ref <150)
VLDL: 29 mg/dL (ref 0–40)

## 2021-08-27 LAB — HEPARIN LEVEL (UNFRACTIONATED): Heparin Unfractionated: 0.51 IU/mL (ref 0.30–0.70)

## 2021-08-27 LAB — GLUCOSE, CAPILLARY
Glucose-Capillary: 225 mg/dL — ABNORMAL HIGH (ref 70–99)
Glucose-Capillary: 243 mg/dL — ABNORMAL HIGH (ref 70–99)
Glucose-Capillary: 179 mg/dL — ABNORMAL HIGH (ref 70–99)
Glucose-Capillary: 135 mg/dL — ABNORMAL HIGH (ref 70–99)
Glucose-Capillary: 226 mg/dL — ABNORMAL HIGH (ref 70–99)

## 2021-08-27 LAB — TROPONIN I (HIGH SENSITIVITY): Troponin I (High Sensitivity): >24000 ng/L (ref <18)

## 2021-08-27 LAB — MAGNESIUM: Magnesium: 2.1 mg/dL (ref 1.7–2.4)

## 2021-08-27 MED ORDER — HEPARIN SODIUM (PORCINE) 1000 UNIT/ML IJ SOLN
INTRAMUSCULAR | Status: AC
  Filled 2021-08-27: qty 4

## 2021-08-27 MED ORDER — HEPARIN (PORCINE) 25000 UT/250ML-% IV SOLN
1100.0000 [IU]/h | INTRAVENOUS | Status: DC
  Administered 2021-08-27: 1100 [IU]/h via INTRAVENOUS
  Filled 2021-08-27: qty 250

## 2021-08-27 MED ORDER — MIDODRINE HCL 5 MG PO TABS
10.0000 mg | ORAL_TABLET | Freq: Three times a day (TID) | ORAL | Status: DC
  Administered 2021-08-27 – 2021-08-28 (×4): 10 mg via ORAL
  Filled 2021-08-27 (×4): qty 2

## 2021-08-27 MED ORDER — SODIUM CHLORIDE 0.9 % IV SOLN: INTRAVENOUS | Status: DC | PRN

## 2021-08-27 MED ORDER — CHLORHEXIDINE GLUCONATE CLOTH 2 % EX PADS
6.0000 | MEDICATED_PAD | Freq: Every day | CUTANEOUS | Status: DC
  Administered 2021-08-28: 6 via TOPICAL

## 2021-08-27 MED ORDER — ENOXAPARIN SODIUM 30 MG/0.3ML IJ SOSY
30.0000 mg | PREFILLED_SYRINGE | INTRAMUSCULAR | Status: DC
  Administered 2021-08-27 – 2021-08-28 (×2): 30 mg via SUBCUTANEOUS
  Filled 2021-08-27 (×2): qty 0.3

## 2021-08-27 MED ORDER — CALCITRIOL 0.5 MCG PO CAPS
0.5000 ug | ORAL_CAPSULE | ORAL | Status: DC
  Administered 2021-08-27: 0.5 ug via ORAL
  Filled 2021-08-27: qty 1

## 2021-08-27 NOTE — Progress Notes (Signed)
Progress Note  Patient Name: Donna Howe Date of Encounter: 08/27/2021  Primary Cardiologist:   Rozann Lesches, MD   Subjective   Very mild chest pain.  Back is bothering her.  Breathing OK.    Inpatient Medications    Scheduled Meds:  ALPRAZolam  0.25 mg Oral BID   ALPRAZolam  0.5 mg Oral QHS   aspirin EC  81 mg Oral Daily   atorvastatin  80 mg Oral QHS   Chlorhexidine Gluconate Cloth  6 each Topical Daily   clopidogrel  75 mg Oral Daily   ezetimibe  10 mg Oral QHS   ferric citrate  420 mg Oral TID WC   fluticasone furoate-vilanterol  1 puff Inhalation Daily   gabapentin  400 mg Oral QHS   insulin aspart  0-15 Units Subcutaneous TID WC   insulin aspart protamine- aspart  24 Units Subcutaneous BID WC   levothyroxine  125 mcg Oral QAC breakfast   midodrine  10 mg Oral BID WC   pantoprazole  40 mg Oral BID   ranolazine  500 mg Oral QHS   sodium chloride flush  3 mL Intravenous Q12H   sodium chloride flush  3 mL Intravenous Q12H   Continuous Infusions:  sodium chloride     sodium chloride     heparin 1,100 Units/hr (08/27/21 0600)   sodium chloride     PRN Meds: sodium chloride, Place/Maintain arterial line **AND** sodium chloride, acetaminophen, albuterol, HYDROmorphone (DILAUDID) injection, ipratropium, nitroGLYCERIN, ondansetron (ZOFRAN) IV, sodium chloride flush   Vital Signs    Vitals:   08/27/21 0300 08/27/21 0400 08/27/21 0500 08/27/21 0600  BP: (!) 92/50 (!) 100/58 (!) 119/55 (!) 86/48  Pulse:   84 83  Resp: 14 17 16  (!) 21  Temp:      TempSrc:      SpO2: 100% 100% 100% 100%  Weight:      Height:        Intake/Output Summary (Last 24 hours) at 08/27/2021 0811 Last data filed at 08/27/2021 0600 Gross per 24 hour  Intake 215.94 ml  Output --  Net 215.94 ml   Filed Weights   08/26/21 0234  Weight: 95.3 kg    Telemetry    NSR, PVCs couplets - Personally Reviewed  ECG    NA - Personally Reviewed  Physical Exam   GEN: No acute  distress.   Neck: No  JVD Cardiac: RRR, no murmurs, rubs, or gallops.  Respiratory: Clear  to auscultation bilaterally. GI: Soft, nontender, non-distended  MS: No  edema; No deformity.Right radial site without bleeding or bruising Neuro:  Nonfocal  Psych: Normal affect   Labs    Chemistry Recent Labs  Lab 08/24/21 2233 08/25/21 0704 08/26/21 0340 08/26/21 2046 08/27/21 0347  NA 137   < > 135 131*  130* 129*  K 4.2   < > 3.8 4.2  4.2 5.6*  CL 98   < > 93* 96*  96* 96*  CO2 26   < > 30 24  24  21*  GLUCOSE 210*   < > 439* 247*  243* 233*  BUN 32*   < > 25* 29*  29* 35*  CREATININE 5.62*   < > 3.93* 4.52*  4.44* 5.27*  CALCIUM 9.6   < > 9.9 8.4*  8.3* 8.5*  PROT 7.0  --  7.4 5.6*  --   ALBUMIN 3.5  --  3.7 2.8*  2.7*  --   AST 21  --  38 91*  --   ALT 17  --  23 28  --   ALKPHOS 88  --  95 64  --   BILITOT 0.4  --  0.4 0.7  --   GFRNONAA 8*   < > 12* 10*  11* 9*  ANIONGAP 13   < > 12 11  10 12    < > = values in this interval not displayed.     Hematology Recent Labs  Lab 08/26/21 0340 08/26/21 2046 08/27/21 0347  WBC 18.3* 15.1* 14.9*  RBC 3.63* 3.14* 3.20*  HGB 12.7 10.7* 11.1*  HCT 39.3 33.9* 33.9*  MCV 108.3* 108.0* 105.9*  MCH 35.0* 34.1* 34.7*  MCHC 32.3 31.6 32.7  RDW 14.3 14.3 14.2  PLT 215 165 161    Cardiac EnzymesNo results for input(s): TROPONINI in the last 168 hours. No results for input(s): TROPIPOC in the last 168 hours.   BNP Recent Labs  Lab 08/25/21 0704  BNP 1,086.0*     DDimer  Recent Labs  Lab 08/25/21 0704  DDIMER 0.49     Radiology    CARDIAC CATHETERIZATION  Result Date: 08/26/2021   Ost LAD to Prox LAD lesion is 100% stenosed.  Prox LAD to Mid LAD lesion is 100% stenosed.   Prox RCA lesion is 100% stenosed.  LPAV lesion is 80% stenosed.   1st Mrg-1 lesion is 99% stenosed.  1st Mrg-2 lesion is 75% stenosed.   -----------------------   [name] to Dist LM lesion is 95% stenosed.  Ost Cx to Mid Cx lesion is 99%  stenosed.   Balloon angioplasty was performed using a BALLN SAPPHIRE [place] 3.0X12.   Post intervention, there is a 40% residual stenosis in the ostial to mid LCx, and 50% residual stenosis in the ostial LM-distal LM.Marland Kitchen   LIMA-LAD graft was visualized by angiography and is normal in caliber.  Origin to Prox Graft stent is 35% stenosed.   SVG-rPDA:  Ostial stent is 20% stenosed.  Prox Graft-1 proximal stent is 15% stenosed.  Mid Graft stent is 15% stenosed.  Otherwise mild diffuse disease in the both the stented and nondistended portions of the graft.   SVG-OM graft was not visualized due to known occlusion.  Origin to Prox Graft lesion before 1st Mrg  is 100% stenosed.   ---------- HEMODYNAMICS ----------   There is moderate left ventricular systolic dysfunction.   The left ventricular ejection fraction is 35-45% by visual estimate.   There is no aortic valve stenosis. SUMMARY Known severe native vessel disease: Known RCA CTO (not imaged), known LAD CTO.  Also known occluded SVG-OM LM-LCx stent 90 to 95% ISR (likely culprit lesion) with 90%/stable ostial small caliber OM branch.  LCx is a large likely codominant vessel.. (Moderately successful) Sargent balloon PTCA of LM-LCx ISR using 3.0 mm Fellows balloon further attempted balloon inflations were aborted due to patient having significant anginal pain and blood pressure drops with inflations) -> stenosis reduced to 50%.  TIMI-3 flow 35% ISR of ostial LIMA graft Mild 20% ISR throughout ostial-proximal SVG-PDA graft 10 to 15 mmHg gradient through ostial L-Subclavian Artery Stent Moderate LVEDP with moderate reduced EF of 40 to 45% global HK.  (Recommend echocardiogram to better assess) Recommendations: Aggressive risk factor modification, if symptoms worsen, would then need to become more aggressive with the Left Main stent post dilation. [name], MD .   [facility] Chest Port 1 View  Result Date: 08/26/2021 CLINICAL DATA:  Chest pain and shortness of breath  EXAM: PORTABLE CHEST 1  VIEW COMPARISON:  08/25/2021 FINDINGS: Cardiac shadow is stable. Postsurgical changes are noted. Dialysis catheter is noted. Increased vascular congestion and interstitial edema is noted when compared with the previous day likely related to volume overload. No focal infiltrate is seen. Left subclavian stent is again noted. IMPRESSION: Increased vascular congestion and interstitial edema consistent with volume overload. Electronically Signed   By: Inez Catalina M.D.   On: 08/26/2021 02:23    Cardiac Studies   Cath  Diagnostic Dominance: Co-dominant Intervention    Patient Profile     65 y.o. female with past medical history of CAD (s/p CABG in 2005 with multiple PCI's since including PTCA to SVG-OM1 in 01/2004, PTCA/DES to LM and prox LCx in 03/2004 and DES to SVG-OM1, PTCA/DES to SVG-OM in 01/2011, PTCA/DES to LM and LCx in 2015, cutting balloon angioplasty to prox Cx and DES to SVG-RCA in 05/2017, NSTEMI in 03/2018 with DES to mid-RCA and DES to LIMA-LAD, NSTEMI in 06/2020 and medical management recommended as she was felt to be high-risk for re-do CABG and poor target vessels, s/p NSTEMI in 12/2020 with DESx2 to SVG-RCA, NSTEMI in 05/2021 with ISR of stent in the SVG-RCA and treated with angioplasty), HTN, HLD, Type 2 DM, carotid artery stenosis (s/p R CEA in 2014), left subclavian stenosis (s/p stent in 07/2020) and ESRD who was seen 08/26/2021 for the evaluation of NSTEMI at the request of Dr. Sedonia Small.  Assessment & Plan    NSTEMI:   PCI of circ as above. Continue medication management.  OK to stop heparin.  Ambulate and possibly discharge in the AM.    CAD:  See above.   Hypotension:  BP difficult to assess.  Unable to titrate meds.    HLD:  Continue current meds.    Lipids are at target.   IDDM:  On insulin with SSI.    COPD:  Continue with current meds.     ESRD:  We will notify nephrology service.    For questions or updates, please contact [place] Please consult  [email or web address] for contact info under Cardiology/STEMI.   Signed, Minus Breeding, MD  08/27/2021, 8:11 AM

## 2021-08-27 NOTE — Progress Notes (Signed)
ANTICOAGULATION CONSULT NOTE - Initial Consult  Pharmacy Consult for heparin Indication:  NSTEMI now s/p cardiac cath  Allergies  Allergen Reactions   Penicillins Other (See Comments)    REACTION: Unknown, told as a child Has patient had a PCN reaction causing immediate rash, facial/tongue/throat swelling, SOB or lightheadedness with hypotension: Unknown Has patient had a PCN reaction causing severe rash involving mucus membranes or skin necrosis: Unknown Has patient had a PCN reaction that required hospitalization: Unknown Has patient had a PCN reaction occurring within the last 10 years: No If all of the above answers are "NO", then may proceed with Cephalosporin use.    Beta Adrenergic Blockers Other (See Comments)    Hypotension    Patient Measurements: Height: 5\' 5"  (165.1 cm) Weight: 95.3 kg (210 lb) IBW/kg (Calculated) : 57 Heparin Dosing Weight: 80kg  Vital Signs: Temp: 97.7 F (36.5 C) (12/09 2051) Temp Source: Oral (12/09 2051) BP: 69/38 (12/09 2330) Pulse Rate: 85 (12/09 2330)  Labs: Recent Labs    08/25/21 0704 08/25/21 0840 08/26/21 0340 08/26/21 0608 08/26/21 1413 08/26/21 2046 08/26/21 2213  HGB 12.0  --  12.7  --   --  10.7*  --   HCT 37.7  --  39.3  --   --  33.9*  --   PLT 158  --  215  --   --  165  --   APTT  --   --   --   --   --   --  40*  LABPROT  --   --  13.0  --   --   --   --   INR  --   --  1.0  --   --   --   --   HEPARINUNFRC  --   --   --   --  0.35  --   --   CREATININE 6.16*  --  3.93*  --   --  4.52*  4.44*  --   TROPONINIHS 73* 95* 1,437* 6,550*  --   --   --     Estimated Creatinine Clearance: 14.5 mL/min (A) (by C-G formula based on SCr of 4.52 mg/dL (H)).   Medical History: Past Medical History:  Diagnosis Date   Anemia    Anxiety    Asthma    CAD (coronary artery disease)    Multivessel s/p CABG 2005, numerous PCIs since that time and documented graft disease   Carotid artery disease ([place])    R CEA   ESRD on  hemodialysis ([place])    Essential hypertension    Gout    History of blood transfusion    Hyperlipidemia    Hypothyroidism    Myocardial infarction [facility])    PAD (peripheral artery disease) ([place])    Dr. Kellie Simmering   Pneumonia 09/2019, 11/2019   S/P angioplasty with stent- DES to mRCA and to LIMA to LAD with DES 04/09/18.   04/10/2018   SBO (small bowel obstruction) ([place]) 2011   Status post lysis of adhesions & hernia repair   Sinus bradycardia    Type 2 diabetes mellitus ([place])    Umbilical hernia     Medications:  Medications Prior to Admission  Medication Sig Dispense Refill Last Dose   albuterol (PROVENTIL) (2.5 MG/3ML) 0.083% nebulizer solution Take 3 mLs (2.5 mg total) by nebulization every 2 (two) hours as needed for wheezing. 75 mL 12    Albuterol Sulfate 108 (90 Base) MCG/ACT AEPB Inhale 2 puffs  into the lungs every 6 (six) hours as needed (Shortness of breath).      allopurinol (ZYLOPRIM) 100 MG tablet Take one tablet after hemodialysis, on Tuesday, Thursday and Saturday. (Patient taking differently: Take 100 mg by mouth Every Tuesday,Thursday,and Saturday with dialysis. After dialysis) 30 tablet 11    ALPRAZolam (XANAX) 0.5 MG tablet Take 0.25-0.5 mg by mouth See admin instructions. Take 0.25 in the morning and evening and 0.5 mg at bedtime      aspirin EC 81 MG tablet Take 81 mg by mouth daily.       atorvastatin (LIPITOR) 80 MG tablet Take 1 tablet (80 mg total) by mouth every evening. (Patient taking differently: Take 80 mg by mouth at bedtime.)      budesonide-formoterol (SYMBICORT) 80-4.5 MCG/ACT inhaler Take 2 puffs first thing in am and then another 2 puffs about 12 hours later. 1 each 11    clopidogrel (PLAVIX) 75 MG tablet Take 1 tablet (75 mg total) by mouth daily. 90 tablet 3    cyclobenzaprine (FLEXERIL) 5 MG tablet Take 1 tablet (5 mg total) by mouth 2 (two) times daily with a meal. (Patient not taking: Reported on 08/25/2021) 30 tablet 0    diclofenac Sodium (VOLTAREN) 1 %  GEL Apply 2 g topically daily as needed (Pain).      ezetimibe (ZETIA) 10 MG tablet Take 1 tablet (10 mg total) by mouth daily. (Patient taking differently: Take 10 mg by mouth at bedtime.) 30 tablet 11    ferric citrate (AURYXIA) 1 GM 210 MG(Fe) tablet Take 420 mg by mouth 3 (three) times daily with meals.      gabapentin (NEURONTIN) 400 MG capsule Take 1 capsule (400 mg total) by mouth at bedtime.      ipratropium (ATROVENT HFA) 17 MCG/ACT inhaler Inhale 2 puffs into the lungs every 4 (four) hours as needed for wheezing.       Iron Sucrose (VENOFER IV) Inject 50 mLs into the vein once a week. At dialysis      levothyroxine (SYNTHROID) 125 MCG tablet Take 125 mcg by mouth daily before breakfast.      Methoxy PEG-Epoetin Beta (MIRCERA IJ) Mircera      midodrine (PROAMATINE) 10 MG tablet Take 1 tablet (10 mg total) by mouth 2 (two) times daily with a meal. 60 tablet 0    multivitamin (RENA-VIT) TABS tablet Take 1 tablet by mouth at bedtime.      nitroGLYCERIN (NITROSTAT) 0.4 MG SL tablet Place 1 tablet (0.4 mg total) under the tongue every 5 (five) minutes x 3 doses as needed for chest pain. 100 tablet 3    NOVOLIN 70/30 KWIKPEN (70-30) 100 UNIT/ML KwikPen Inject 35 Units into the skin in the morning and at bedtime. (Patient taking differently: Inject 24 Units into the skin in the morning and at bedtime.) 15 mL 11    ondansetron (ZOFRAN) 8 MG tablet Take 8 mg by mouth every 8 (eight) hours as needed for nausea.      ONETOUCH ULTRA test strip Check blood glucose THREE TIMES DAILY      pantoprazole (PROTONIX) 40 MG tablet Take 1 tablet (40 mg total) by mouth 2 (two) times daily. 60 tablet 1    ranolazine (RANEXA) 500 MG 12 hr tablet Take 1 tablet (500 mg total) by mouth at bedtime.      VITAMIN D PO Take 50 mcg by mouth See admin instructions. Tue, thur, sat at dialysis      Vitamin D, Ergocalciferol, (  DRISDOL) 1.25 MG (50000 UNIT) CAPS capsule Take 50,000 Units by mouth every Sunday.        Scheduled:   ALPRAZolam  0.25 mg Oral BID   ALPRAZolam  0.5 mg Oral QHS   aspirin EC  81 mg Oral Daily   atorvastatin  80 mg Oral QHS   Chlorhexidine Gluconate Cloth  6 each Topical Daily   clopidogrel  75 mg Oral Daily   ezetimibe  10 mg Oral QHS   ferric citrate  420 mg Oral TID WC   fluticasone furoate-vilanterol  1 puff Inhalation Daily   gabapentin  400 mg Oral QHS   heparin  5,000 Units Subcutaneous Q8H   insulin aspart  0-15 Units Subcutaneous TID WC   insulin aspart protamine- aspart  24 Units Subcutaneous BID WC   levothyroxine  125 mcg Oral QAC breakfast   midodrine  10 mg Oral BID WC   pantoprazole  40 mg Oral BID   ranolazine  500 mg Oral QHS   sodium chloride flush  3 mL Intravenous Q12H   sodium chloride flush  3 mL Intravenous Q12H   Infusions:   sodium chloride     sodium chloride     sodium chloride      Assessment: 65yo female admitted for CP, now s/p cath, to resume systemic heparin 2h after arterial sheath removed; RN reports no bleeding since sheath removed at 2330.  Goal of Therapy:  Heparin level 0.3-0.7 units/ml Monitor platelets by anticoagulation protocol: Yes   Plan:  At 0130 resume heparin infusion at previously therapeutic rate of 1100 units/hr and monitor heparin levels and CBC.  Wynona Neat, PharmD, BCPS  08/27/2021,12:26 AM

## 2021-08-27 NOTE — Plan of Care (Signed)
Arterial sheath pressures markedly higher then cuff pressures. Suspect due to severe arterial calcifications. Since sheath removed, need arterial access to accurately follow BP. Will obtain arterial line

## 2021-08-27 NOTE — Progress Notes (Signed)
Came to ambulate however pt was in HD. RN sts she ambulated in room without CP. I have seen her numerous times for education re MI/PCIs in the past. Her ambulation is limited and she should f/u with RD at HD for diet. Will place referral for AP CRPII however pt has not been interested in the past.  [id number] [place], ACSM 2:00 PM 08/27/2021

## 2021-08-27 NOTE — Plan of Care (Signed)

## 2021-08-27 NOTE — Consult Note (Signed)
[facility] Nephrology Consult Note: Reason for Consult: To manage dialysis and dialysis related needs Referring Physician: Dr. Marlou Porch, Elta [place]  HPI:  Donna Howe is an 65 y.o. female with past medical history of CAD status post CABG in 2005 and multiple stent placement, PAD, HLD, hypothyroidism, ESRD on HD TTS at [facility] in [facility] presents with chest pain or shortness of breath, seen as a consultation for the evaluation and management of ESRD. The patient had last dialysis on 12/8 and tolerated well.  She has chronic hypotension therefore she is on midodrine 10 mg twice daily.  She was found to have elevated troponin level with EKG changes.  She was taken to Cath Lab where PCI of circumflex done. Today she reports feeling much better.  Denied chest pain, shortness of breath, headache, dizziness, nausea or vomiting.  Her blood pressure is around 80s with per patient is her baseline.  She is clinically asymptomatic.  She has right [facility] for dialysis.  Plan for HD today.    OP HD orders:  Dialyzes at  [facility] TTS ,  EDW 93.5 kg, 2K, 2.5 ca, 4 hour 15 mins, last OP HD on 12/8[facility] for the access.  Mircera 30 mcg q4weeks Venofer 50 mg weekly Calcitriol 0.5 mcg 3x/week   Past Medical History:  Diagnosis Date   Anemia    Anxiety    Asthma    CAD (coronary artery disease)    Multivessel s/p CABG 2005, numerous PCIs since that time and documented graft disease   Carotid artery disease (HCC)    R CEA   ESRD on hemodialysis ([place])    Essential hypertension    Gout    History of blood transfusion    Hyperlipidemia    Hypothyroidism    Myocardial infarction [facility])    PAD (peripheral artery disease) ([place])    Dr. Kellie Simmering   Pneumonia 09/2019, 11/2019   S/P angioplasty with stent- DES to mRCA and to LIMA to LAD with DES 04/09/18.   04/10/2018   SBO (small bowel obstruction) ([place]) 2011   Status post lysis of adhesions & hernia repair   Sinus bradycardia    Type 2  diabetes mellitus ([place])    Umbilical hernia     Past Surgical History:  Procedure Laterality Date   AORTIC ARCH ANGIOGRAPHY N/A 08/02/2020   Procedure: AORTIC ARCH ANGIOGRAPHY;  Surgeon: Waynetta Sandy, MD;  Location: [place] CV LAB;  Service: Cardiovascular;  Laterality: N/A;  Lt upper extermity   AV FISTULA PLACEMENT Left 06/29/2020   Procedure: LEFT ARM ARTERIOVENOUS (AV) FISTULA;  Surgeon: Waynetta Sandy, MD;  Location: [place];  Service: Vascular;  Laterality: Left;  ARM   AV FISTULA PLACEMENT Right 05/11/2021   Procedure: RIGHT BRACHIOCEPHALIC ARTERIOVENOUS (AV) FISTULA CREATION;  Surgeon: Waynetta Sandy, MD;  Location: [facility] OR;  Service: Vascular;  Laterality: Right;   [place]  2010   CORONARY ARTERY BYPASS GRAFT  2005   CORONARY BALLOON ANGIOPLASTY N/A 05/31/2017   Procedure: CORONARY BALLOON ANGIOPLASTY;  Surgeon: Jettie Booze, MD;  Location: [place] CV LAB;  Service: Cardiovascular;  Laterality: N/A;   CORONARY BALLOON ANGIOPLASTY N/A 06/14/2021   Procedure: CORONARY BALLOON ANGIOPLASTY;  Surgeon: Jettie Booze, MD;  Location: [place] CV LAB;  Service: Cardiovascular;  Laterality: N/A;   CORONARY STENT INTERVENTION N/A 05/31/2017   Procedure: CORONARY STENT INTERVENTION;  Surgeon: Jettie Booze, MD;  Location: [facility] INVASIVE CV  LAB;  Service: Cardiovascular;  Laterality: N/A;   CORONARY STENT INTERVENTION N/A 04/09/2018   Procedure: CORONARY STENT INTERVENTION;  Surgeon: Jettie Booze, MD;  Location: [place] CV LAB;  Service: Cardiovascular;  Laterality: N/A;  SVG RCA   CORONARY STENT INTERVENTION N/A 01/10/2021   Procedure: CORONARY STENT INTERVENTION;  Surgeon: Leonie Man, MD;  Location: [place] CV LAB;  Service: Cardiovascular;  Laterality: N/A;   CORONARY/GRAFT ACUTE MI REVASCULARIZATION N/A 02/24/2021   Procedure: Coronary/Graft Acute MI Revascularization;  Surgeon:  Jettie Booze, MD;  Location: [place] CV LAB;  Service: Cardiovascular;  Laterality: N/A;   ENDARTERECTOMY Right 04/18/2013   Procedure: ENDARTERECTOMY CAROTID;  Surgeon: Mal Misty, MD;  Location: [place];  Service: Vascular;  Laterality: Right;   HERNIA REPAIR  1989   Incisional hernia repair x2  03/04/2010   Laparoscopic with 35cm mesh by Dr Ronnald Collum   IR [place] CV LINE RIGHT  06/21/2020   IR US GUIDE VASC ACCESS RIGHT  06/21/2020   LEFT HEART CATH AND CORS/GRAFTS ANGIOGRAPHY N/A 05/31/2017   Procedure: LEFT HEART CATH AND CORS/GRAFTS ANGIOGRAPHY;  Surgeon: Jettie Booze, MD;  Location: [place] CV LAB;  Service: Cardiovascular;  Laterality: N/A;   LEFT HEART CATH AND CORS/GRAFTS ANGIOGRAPHY N/A 04/08/2018   Procedure: LEFT HEART CATH AND CORS/GRAFTS ANGIOGRAPHY;  Surgeon: Jettie Booze, MD;  Location: [place] CV LAB;  Service: Cardiovascular;  Laterality: N/A;   LEFT HEART CATH AND CORS/GRAFTS ANGIOGRAPHY N/A 06/22/2020   Procedure: LEFT HEART CATH AND CORS/GRAFTS ANGIOGRAPHY;  Surgeon: Belva Crome, MD;  Location: [place] CV LAB;  Service: Cardiovascular;  Laterality: N/A;   LEFT HEART CATH AND CORS/GRAFTS ANGIOGRAPHY N/A 01/10/2021   Procedure: LEFT HEART CATH AND CORS/GRAFTS ANGIOGRAPHY;  Surgeon: Leonie Man, MD;  Location: [place] CV LAB;  Service: Cardiovascular;  Laterality: N/A;   LEFT HEART CATH AND CORS/GRAFTS ANGIOGRAPHY N/A 02/24/2021   Procedure: LEFT HEART CATH AND CORS/GRAFTS ANGIOGRAPHY;  Surgeon: Jettie Booze, MD;  Location: [place] CV LAB;  Service: Cardiovascular;  Laterality: N/A;   LEFT HEART CATH AND CORS/GRAFTS ANGIOGRAPHY N/A 06/14/2021   Procedure: LEFT HEART CATH AND CORS/GRAFTS ANGIOGRAPHY;  Surgeon: Jettie Booze, MD;  Location: [place] CV LAB;  Service: Cardiovascular;  Laterality: N/A;   LEFT HEART CATHETERIZATION WITH CORONARY ANGIOGRAM N/A 12/19/2012   Procedure: LEFT HEART CATHETERIZATION WITH  CORONARY ANGIOGRAM;  Surgeon: Josue Hector, MD;  Location: [facility] CATH LAB;  Service: Cardiovascular;  Laterality: N/A;   LEFT HEART CATHETERIZATION WITH CORONARY/GRAFT ANGIOGRAM N/A 04/19/2013   Procedure: LEFT HEART CATHETERIZATION WITH Beatrix Fetters;  Surgeon: Lorretta Harp, MD;  Location: [facility] CATH LAB;  Service: Cardiovascular;  Laterality: N/A;   LIGATION OF ARTERIOVENOUS  FISTULA Left 09/15/2020   Procedure: LIGATION OF LEFT ARM ARTERIOVENOUS  FISTULA;  Surgeon: Waynetta Sandy, MD;  Location: [place];  Service: Vascular;  Laterality: Left;   PATCH ANGIOPLASTY Right 04/18/2013   Procedure: PATCH ANGIOPLASTY Right Internal Carotid Artery;  Surgeon: Mal Misty, MD;  Location: [place];  Service: Vascular;  Laterality: Right;   PERCUTANEOUS CORONARY STENT INTERVENTION (PCI-S) Right 12/19/2012   Procedure: PERCUTANEOUS CORONARY STENT INTERVENTION (PCI-S);  Surgeon: Josue Hector, MD;  Location: [facility] CATH LAB;  Service: Cardiovascular;  Laterality: Right;   PERIPHERAL VASCULAR INTERVENTION Left 08/02/2020   Procedure: PERIPHERAL VASCULAR INTERVENTION;  Surgeon: Waynetta Sandy, MD;  Location: [place] CV LAB;  Service: Cardiovascular;  Laterality: Left;  Left subclavian   PERIPHERAL VASCULAR INTERVENTION  02/24/2021   Procedure: PERIPHERAL VASCULAR INTERVENTION;  Surgeon: Marty Heck, MD;  Location: [place] CV LAB;  Service: Vascular;;   SHOULDER SURGERY      Family History  Problem Relation Age of Onset   Diabetes Mother    Heart disease Mother        before age 59   Hyperlipidemia Mother    Hypertension Mother    Thyroid disease Father    Hypertension Father    AAA (abdominal aortic aneurysm) Father    Heart disease Brother        before age 57   Hypertension Brother    Hyperlipidemia Son    Hypertension Son     Social History:  reports that she quit smoking about 8 years ago. Her smoking use included cigarettes. She has a 20.00 pack-year  smoking history. She has never used smokeless tobacco. She reports that she does not drink alcohol and does not use drugs.  Allergies:  Allergies  Allergen Reactions   Penicillins Other (See Comments)    REACTION: Unknown, told as a child Has patient had a PCN reaction causing immediate rash, facial/tongue/throat swelling, SOB or lightheadedness with hypotension: Unknown Has patient had a PCN reaction causing severe rash involving mucus membranes or skin necrosis: Unknown Has patient had a PCN reaction that required hospitalization: Unknown Has patient had a PCN reaction occurring within the last 10 years: No If all of the above answers are "NO", then may proceed with Cephalosporin use.    Beta Adrenergic Blockers Other (See Comments)    Hypotension    Medications: I have reviewed the patient's current medications.   Results for orders placed or performed during the hospital encounter of 08/26/21 (from the past 48 hour(s))  CBG monitoring, ED     Status: Abnormal   Collection Time: 08/26/21  1:40 AM  Result Value Ref Range   Glucose-Capillary 477 (H) 70 - 99 mg/dL    Comment: Glucose reference range applies only to samples taken after fasting for at least 8 hours.  Resp Panel by RT-PCR (Flu A&B, Covid) Nasopharyngeal Swab     Status: None   Collection Time: 08/26/21  2:33 AM   Specimen: Nasopharyngeal Swab; Nasopharyngeal(NP) swabs in vial transport medium  Result Value Ref Range   SARS Coronavirus 2 by RT PCR NEGATIVE NEGATIVE    Comment: (NOTE) SARS-CoV-2 target nucleic acids are NOT DETECTED.  The SARS-CoV-2 RNA is generally detectable in upper respiratory specimens during the acute phase of infection. The lowest concentration of SARS-CoV-2 viral copies this assay can detect is 138 copies/mL. A negative result does not preclude SARS-Cov-2 infection and should not be used as the sole basis for treatment or other patient management decisions. A negative result may occur with   improper specimen collection/handling, submission of specimen other than nasopharyngeal swab, presence of viral mutation(s) within the areas targeted by this assay, and inadequate number of viral copies(<138 copies/mL). A negative result must be combined with clinical observations, patient history, and epidemiological information. The expected result is Negative.  Fact Sheet for Patients:  EntrepreneurPulse.com.au  Fact Sheet for Healthcare Providers:  IncredibleEmployment.be  This test is no t yet approved or cleared by the [place] FDA and  has been authorized for detection and/or diagnosis of SARS-CoV-2 by FDA under an Emergency Use Authorization (EUA). This EUA will remain  in effect (meaning this test can be used) for the duration of  the COVID-19 declaration under Section 564(b)(1) of the Act, 21 U.S.C.section 360bbb-3(b)(1), unless the authorization is terminated  or revoked sooner.       Influenza A by PCR NEGATIVE NEGATIVE   Influenza B by PCR NEGATIVE NEGATIVE    Comment: (NOTE) The Xpert Xpress SARS-CoV-2/FLU/RSV plus assay is intended as an aid in the diagnosis of influenza from Nasopharyngeal swab specimens and should not be used as a sole basis for treatment. Nasal washings and aspirates are unacceptable for Xpert Xpress SARS-CoV-2/FLU/RSV testing.  Fact Sheet for Patients: EntrepreneurPulse.com.au  Fact Sheet for Healthcare Providers: IncredibleEmployment.be  This test is not yet approved or cleared by the [place] FDA and has been authorized for detection and/or diagnosis of SARS-CoV-2 by FDA under an Emergency Use Authorization (EUA). This EUA will remain in effect (meaning this test can be used) for the duration of the COVID-19 declaration under Section 564(b)(1) of the Act, 21 U.S.C. section 360bbb-3(b)(1), unless the authorization is terminated or revoked.  Performed at  [facility], [address], [place], [place] [address]   CBC     Status: Abnormal   Collection Time: 08/26/21  3:40 AM  Result Value Ref Range   WBC 18.3 (H) 4.0 - 10.5 K/uL   RBC 3.63 (L) 3.87 - 5.11 MIL/uL   Hemoglobin 12.7 12.0 - 15.0 g/dL   HCT 39.3 36.0 - 46.0 %   MCV 108.3 (H) 80.0 - 100.0 fL   MCH 35.0 (H) 26.0 - 34.0 pg   MCHC 32.3 30.0 - 36.0 g/dL   RDW 14.3 11.5 - 15.5 %   Platelets 215 150 - 400 K/uL   nRBC 0.0 0.0 - 0.2 %    Comment: Performed at [facility], [address], [place], [place] [address]  Comprehensive metabolic panel     Status: Abnormal   Collection Time: 08/26/21  3:40 AM  Result Value Ref Range   Sodium 135 135 - 145 mmol/L   Potassium 3.8 3.5 - 5.1 mmol/L    Comment: DELTA CHECK NOTED   Chloride 93 (L) 98 - 111 mmol/L   CO2 30 22 - 32 mmol/L   Glucose, Bld 439 (H) 70 - 99 mg/dL    Comment: Glucose reference range applies only to samples taken after fasting for at least 8 hours.   BUN 25 (H) 8 - 23 mg/dL   Creatinine, Ser 3.93 (H) 0.44 - 1.00 mg/dL    Comment: DELTA CHECK NOTED   Calcium 9.9 8.9 - 10.3 mg/dL   Total Protein 7.4 6.5 - 8.1 g/dL   Albumin 3.7 3.5 - 5.0 g/dL   AST 38 15 - 41 U/L   ALT 23 0 - 44 U/L   Alkaline Phosphatase 95 38 - 126 U/L   Total Bilirubin 0.4 0.3 - 1.2 mg/dL   GFR, Estimated 12 (L) >60 mL/min    Comment: (NOTE) Calculated using the CKD-EPI Creatinine Equation (2021)    Anion gap 12 5 - 15    Comment: Performed at [facility], [address], [place], [place] [address]  Troponin I (High Sensitivity)     Status: Abnormal   Collection Time: 08/26/21  3:40 AM  Result Value Ref Range   Troponin I (High Sensitivity) 1,437 (HH) <18 ng/L    Comment: CRITICAL RESULT CALLED TO, READ BACK BY AND VERIFIED WITH: BRAME,M AT 4:50AM ON 08/26/21 BY FESTERMAN,C (NOTE) Elevated high sensitivity troponin I (hsTnI) values and significant  changes across serial measurements may suggest ACS but many other  chronic and acute  conditions are known to elevate hsTnI results.  Refer to the Links section for chest pain algorithms and additional  guidance. Performed at [facility], [address], [place], [place] [address]   Protime-INR     Status: None   Collection Time: 08/26/21  3:40 AM  Result Value Ref Range   Prothrombin Time 13.0 11.4 - 15.2 seconds   INR 1.0 0.8 - 1.2    Comment: (NOTE) INR goal varies based on device and disease states. Performed at [facility], [address], [place], [place] [address]   Troponin I (High Sensitivity)     Status: Abnormal   Collection Time: 08/26/21  6:08 AM  Result Value Ref Range   Troponin I (High Sensitivity) 6,550 (HH) <18 ng/L    Comment: CRITICAL RESULT CALLED TO, READ BACK BY AND VERIFIED WITH: BRAME,M AT 6:55AM ON 08/26/21 BY FESTERMAN,C (NOTE) Elevated high sensitivity troponin I (hsTnI) values and significant  changes across serial measurements may suggest ACS but many other  chronic and acute conditions are known to elevate hsTnI results.  Refer to the Links section for chest pain algorithms and additional  guidance. Performed at [facility], [address], [place], [place] [address]   MRSA Next Gen by PCR, Nasal     Status: None   Collection Time: 08/26/21  9:10 AM   Specimen: Nasal Mucosa; Nasal Swab  Result Value Ref Range   MRSA by PCR Next Gen NOT DETECTED NOT DETECTED    Comment: (NOTE) The GeneXpert MRSA Assay (FDA approved for NASAL specimens only), is one component of a comprehensive MRSA colonization surveillance program. It is not intended to diagnose MRSA infection nor to guide or monitor treatment for MRSA infections. Test performance is not FDA approved in patients less than 5 years old. Performed at [facility], [place] [address] [place], [place] [address]   Hemoglobin A1c     Status: Abnormal   Collection Time: 08/26/21 10:24 AM  Result Value Ref Range   Hgb A1c MFr Bld 7.1 (H) 4.8 - 5.6 %    Comment: (NOTE) Pre  diabetes:          5.7%-6.4%  Diabetes:              >6.4%  Glycemic control for   <7.0% adults with diabetes    Mean Plasma Glucose 157.07 mg/dL    Comment: Performed at [facility] [address] [place], [place] [address]  Glucose, capillary     Status: Abnormal   Collection Time: 08/26/21 12:30 PM  Result Value Ref Range   Glucose-Capillary 295 (H) 70 - 99 mg/dL    Comment: Glucose reference range applies only to samples taken after fasting for at least 8 hours.  Heparin level (unfractionated)     Status: None   Collection Time: 08/26/21  2:13 PM  Result Value Ref Range   Heparin Unfractionated 0.35 0.30 - 0.70 IU/mL    Comment: (NOTE) The clinical reportable range upper limit is being lowered to >1.10 to align with the FDA approved guidance for the current laboratory assay.  If heparin results are below expected values, and patient dosage has  been confirmed, suggest follow up testing of antithrombin III levels. Performed at [facility], [place] [address] [place], [place] [address]   Glucose, capillary     Status: Abnormal   Collection Time: 08/26/21  3:30 PM  Result Value Ref Range   Glucose-Capillary 256 (  H) 70 - 99 mg/dL    Comment: Glucose reference range applies only to samples taken after fasting for at least 8 hours.  POCT Activated clotting time     Status: None   Collection Time: 08/26/21  6:24 PM  Result Value Ref Range   Activated Clotting Time 317 seconds    Comment: Reference range 74-137 seconds for patients not on anticoagulant therapy.  POCT Activated clotting time     Status: None   Collection Time: 08/26/21  6:53 PM  Result Value Ref Range   Activated Clotting Time 269 seconds    Comment: Reference range 74-137 seconds for patients not on anticoagulant therapy.  Glucose, capillary     Status: Abnormal   Collection Time: 08/26/21  7:36 PM  Result Value Ref Range   Glucose-Capillary 240 (H) 70 - 99 mg/dL    Comment: Glucose reference  range applies only to samples taken after fasting for at least 8 hours.  POCT Activated clotting time     Status: None   Collection Time: 08/26/21  7:38 PM  Result Value Ref Range   Activated Clotting Time 239 seconds    Comment: Reference range 74-137 seconds for patients not on anticoagulant therapy.  Glucose, capillary     Status: Abnormal   Collection Time: 08/26/21  8:20 PM  Result Value Ref Range   Glucose-Capillary 213 (H) 70 - 99 mg/dL    Comment: Glucose reference range applies only to samples taken after fasting for at least 8 hours.  CBC with Differential/Platelet     Status: Abnormal   Collection Time: 08/26/21  8:46 PM  Result Value Ref Range   WBC 15.1 (H) 4.0 - 10.5 K/uL   RBC 3.14 (L) 3.87 - 5.11 MIL/uL   Hemoglobin 10.7 (L) 12.0 - 15.0 g/dL   HCT 33.9 (L) 36.0 - 46.0 %   MCV 108.0 (H) 80.0 - 100.0 fL   MCH 34.1 (H) 26.0 - 34.0 pg   MCHC 31.6 30.0 - 36.0 g/dL   RDW 14.3 11.5 - 15.5 %   Platelets 165 150 - 400 K/uL   nRBC 0.0 0.0 - 0.2 %   Neutrophils Relative % 74 %   Neutro Abs 11.1 (H) 1.7 - 7.7 K/uL   Lymphocytes Relative 16 %   Lymphs Abs 2.5 0.7 - 4.0 K/uL   Monocytes Relative 8 %   Monocytes Absolute 1.2 (H) 0.1 - 1.0 K/uL   Eosinophils Relative 1 %   Eosinophils Absolute 0.2 0.0 - 0.5 K/uL   Basophils Relative 0 %   Basophils Absolute 0.1 0.0 - 0.1 K/uL   Immature Granulocytes 1 %   Abs Immature Granulocytes 0.17 (H) 0.00 - 0.07 K/uL    Comment: Performed at [facility], [address] [place], [place] [address]  Comprehensive metabolic panel     Status: Abnormal   Collection Time: 08/26/21  8:46 PM  Result Value Ref Range   Sodium 131 (L) 135 - 145 mmol/L   Potassium 4.2 3.5 - 5.1 mmol/L   Chloride 96 (L) 98 - 111 mmol/L   CO2 24 22 - 32 mmol/L   Glucose, Bld 247 (H) 70 - 99 mg/dL    Comment: Glucose reference range applies only to samples taken after fasting for at least 8 hours.   BUN 29 (H) 8 - 23 mg/dL   Creatinine, Ser 4.52 (H) 0.44  - 1.00 mg/dL   Calcium 8.4 (L) 8.9 - 10.3 mg/dL   Total Protein  5.6 (L) 6.5 - 8.1 g/dL   Albumin 2.8 (L) 3.5 - 5.0 g/dL   AST 91 (H) 15 - 41 U/L   ALT 28 0 - 44 U/L   Alkaline Phosphatase 64 38 - 126 U/L   Total Bilirubin 0.7 0.3 - 1.2 mg/dL   GFR, Estimated 10 (L) >60 mL/min    Comment: (NOTE) Calculated using the CKD-EPI Creatinine Equation (2021)    Anion gap 11 5 - 15    Comment: Performed at [facility] [address] [place], [place] [address]  Magnesium     Status: None   Collection Time: 08/26/21  8:46 PM  Result Value Ref Range   Magnesium 1.9 1.7 - 2.4 mg/dL    Comment: Performed at [facility] [address], [place], [place] [address]  Renal function panel     Status: Abnormal   Collection Time: 08/26/21  8:46 PM  Result Value Ref Range   Sodium 130 (L) 135 - 145 mmol/L   Potassium 4.2 3.5 - 5.1 mmol/L   Chloride 96 (L) 98 - 111 mmol/L   CO2 24 22 - 32 mmol/L   Glucose, Bld 243 (H) 70 - 99 mg/dL    Comment: Glucose reference range applies only to samples taken after fasting for at least 8 hours.   BUN 29 (H) 8 - 23 mg/dL   Creatinine, Ser 4.44 (H) 0.44 - 1.00 mg/dL   Calcium 8.3 (L) 8.9 - 10.3 mg/dL   Phosphorus 4.9 (H) 2.5 - 4.6 mg/dL   Albumin 2.7 (L) 3.5 - 5.0 g/dL   GFR, Estimated 11 (L) >60 mL/min    Comment: (NOTE) Calculated using the CKD-EPI Creatinine Equation (2021)    Anion gap 10 5 - 15    Comment: Performed at [facility] [address], [place], [place] [address]  APTT     Status: Abnormal   Collection Time: 08/26/21 10:13 PM  Result Value Ref Range   aPTT 40 (H) 24 - 36 seconds    Comment:        IF BASELINE aPTT IS ELEVATED, SUGGEST PATIENT RISK ASSESSMENT BE USED TO DETERMINE APPROPRIATE ANTICOAGULANT THERAPY. Performed at [facility], [place] [address] [place], [place] [address]   POCT Activated clotting time     Status: None   Collection Time: 08/26/21 11:00 PM  Result Value Ref Range   Activated  Clotting Time 149 seconds    Comment: Reference range 74-137 seconds for patients not on anticoagulant therapy.  Lipid panel     Status: None   Collection Time: 08/27/21  3:47 AM  Result Value Ref Range   Cholesterol 94 0 - 200 mg/dL   Triglycerides 144 <150 mg/dL   HDL 44 >40 mg/dL   Total CHOL/HDL Ratio 2.1 RATIO   VLDL 29 0 - 40 mg/dL   LDL Cholesterol 21 0 - 99 mg/dL    Comment:        Total Cholesterol/HDL:CHD Risk Coronary Heart Disease Risk Table                     Men   Women  1/2 Average Risk   3.4   3.3  Average Risk       5.0   4.4  2 X Average Risk   9.6   7.1  3 X Average Risk  23.4   11.0        Use the calculated Patient Ratio  above and the CHD Risk Table to determine the patient's CHD Risk.        ATP III CLASSIFICATION (LDL):  <100     mg/dL   Optimal  100-129  mg/dL   Near or Above                    Optimal  130-159  mg/dL   Borderline  160-189  mg/dL   High  >190     mg/dL   Very High Performed at [facility] [address], [place], [place] [address]   Basic metabolic panel     Status: Abnormal   Collection Time: 08/27/21  3:47 AM  Result Value Ref Range   Sodium 129 (L) 135 - 145 mmol/L   Potassium 5.6 (H) 3.5 - 5.1 mmol/L    Comment: DELTA CHECK NOTED   Chloride 96 (L) 98 - 111 mmol/L   CO2 21 (L) 22 - 32 mmol/L   Glucose, Bld 233 (H) 70 - 99 mg/dL    Comment: Glucose reference range applies only to samples taken after fasting for at least 8 hours.   BUN 35 (H) 8 - 23 mg/dL   Creatinine, Ser 5.27 (H) 0.44 - 1.00 mg/dL   Calcium 8.5 (L) 8.9 - 10.3 mg/dL   GFR, Estimated 9 (L) >60 mL/min    Comment: (NOTE) Calculated using the CKD-EPI Creatinine Equation (2021)    Anion gap 12 5 - 15    Comment: Performed at [facility] [address], [place], [place] [address]  CBC with Differential/Platelet     Status: Abnormal   Collection Time: 08/27/21  3:47 AM  Result Value Ref Range   WBC 14.9 (H) 4.0 - 10.5 K/uL   RBC 3.20 (L) 3.87  - 5.11 MIL/uL   Hemoglobin 11.1 (L) 12.0 - 15.0 g/dL   HCT 33.9 (L) 36.0 - 46.0 %   MCV 105.9 (H) 80.0 - 100.0 fL   MCH 34.7 (H) 26.0 - 34.0 pg   MCHC 32.7 30.0 - 36.0 g/dL   RDW 14.2 11.5 - 15.5 %   Platelets 161 150 - 400 K/uL   nRBC 0.1 0.0 - 0.2 %   Neutrophils Relative % 74 %   Neutro Abs 11.0 (H) 1.7 - 7.7 K/uL   Lymphocytes Relative 14 %   Lymphs Abs 2.1 0.7 - 4.0 K/uL   Monocytes Relative 10 %   Monocytes Absolute 1.4 (H) 0.1 - 1.0 K/uL   Eosinophils Relative 1 %   Eosinophils Absolute 0.1 0.0 - 0.5 K/uL   Basophils Relative 0 %   Basophils Absolute 0.1 0.0 - 0.1 K/uL   Immature Granulocytes 1 %   Abs Immature Granulocytes 0.17 (H) 0.00 - 0.07 K/uL    Comment: Performed at [facility] [address], [place], [place] [address]  Magnesium     Status: None   Collection Time: 08/27/21  3:47 AM  Result Value Ref Range   Magnesium 2.1 1.7 - 2.4 mg/dL    Comment: Performed at [facility] [address], [place], [place] [address]  Troponin I (High Sensitivity)     Status: Abnormal   Collection Time: 08/27/21  3:47 AM  Result Value Ref Range   Troponin I (High Sensitivity) >24,000 (HH) <18 ng/L    Comment: CRITICAL RESULT CALLED TO, READ BACK BY AND VERIFIED WITH: Paris Lore RN 08/27/21 [place] Performed at [facility], [address] [place], [place] [address]   Glucose, capillary     Status: Abnormal   Collection Time: 08/27/21  6:11 AM  Result Value Ref Range   Glucose-Capillary 225 (H) 70 - 99 mg/dL    Comment: Glucose reference range applies only to samples taken after fasting for at least 8 hours.  Glucose, capillary     Status: Abnormal   Collection Time: 08/27/21  6:36 AM  Result Value Ref Range   Glucose-Capillary 243 (H) 70 - 99 mg/dL    Comment: Glucose reference range applies only to samples taken after fasting for at least 8 hours.    CARDIAC CATHETERIZATION  Result Date: 08/26/2021   Ost LAD to Prox LAD lesion is  100% stenosed.  Prox LAD to Mid LAD lesion is 100% stenosed.   Prox RCA lesion is 100% stenosed.  LPAV lesion is 80% stenosed.   1st Mrg-1 lesion is 99% stenosed.  1st Mrg-2 lesion is 75% stenosed.   -----------------------   [name] to Dist LM lesion is 95% stenosed.  Ost Cx to Mid Cx lesion is 99% stenosed.   Balloon angioplasty was performed using a BALLN SAPPHIRE [place] 3.0X12.   Post intervention, there is a 40% residual stenosis in the ostial to mid LCx, and 50% residual stenosis in the ostial LM-distal LM.Marland Kitchen   LIMA-LAD graft was visualized by angiography and is normal in caliber.  Origin to Prox Graft stent is 35% stenosed.   SVG-rPDA:  Ostial stent is 20% stenosed.  Prox Graft-1 proximal stent is 15% stenosed.  Mid Graft stent is 15% stenosed.  Otherwise mild diffuse disease in the both the stented and nondistended portions of the graft.   SVG-OM graft was not visualized due to known occlusion.  Origin to Prox Graft lesion before 1st Mrg  is 100% stenosed.   ---------- HEMODYNAMICS ----------   There is moderate left ventricular systolic dysfunction.   The left ventricular ejection fraction is 35-45% by visual estimate.   There is no aortic valve stenosis. SUMMARY Known severe native vessel disease: Known RCA CTO (not imaged), known LAD CTO.  Also known occluded SVG-OM LM-LCx stent 90 to 95% ISR (likely culprit lesion) with 90%/stable ostial small caliber OM branch.  LCx is a large likely codominant vessel.. (Moderately successful) [place] balloon PTCA of LM-LCx ISR using 3.0 mm  balloon further attempted balloon inflations were aborted due to patient having significant anginal pain and blood pressure drops with inflations) -> stenosis reduced to 50%.  TIMI-3 flow 35% ISR of ostial LIMA graft Mild 20% ISR throughout ostial-proximal SVG-PDA graft 10 to 15 mmHg gradient through ostial L-Subclavian Artery Stent Moderate LVEDP with moderate reduced EF of 40 to 45% global HK.  (Recommend echocardiogram to better assess)  Recommendations: Aggressive risk factor modification, if symptoms worsen, would then need to become more aggressive with the Left Main stent post dilation. [name], MD .   DG Chest Port 1 View  Result Date: 08/26/2021 CLINICAL DATA:  Chest pain and shortness of breath EXAM: PORTABLE CHEST 1 VIEW COMPARISON:  08/25/2021 FINDINGS: Cardiac shadow is stable. Postsurgical changes are noted. Dialysis catheter is noted. Increased vascular congestion and interstitial edema is noted when compared with the previous day likely related to volume overload. No focal infiltrate is seen. Left subclavian stent is again noted. IMPRESSION: Increased vascular congestion and interstitial edema consistent with volume overload. Electronically Signed   By: Inez Catalina M.D.   On: 08/26/2021 02:23    ROS: As  per H&P, rest of the systems reviewed and are negative.  Blood pressure (!) 83/61, pulse 67, temperature 97.7 F (36.5 C), temperature source Oral, resp. rate 17, height 5\' 5"  (1.651 m), weight 95.3 kg, SpO2 100 %. Gen: NAD, comfortable Respiratory: Clear bilateral, no wheezing or crackle Cardiovascular: Regular rate rhythm S1-S2 normal, no rubs GI: Abdomen soft, nontender, nondistended Extremities, no cyanosis or clubbing, no edema Skin: No rash or ulcer Neurology: Alert, awake, following commands, oriented Dialysis Access:  Assessment/Plan:  #NSTEMI: Presented with chest pain shortness of breath and EKG changes.  Reported cardiac cath and PCI of the circumflex.  Now chest pain..  Discussed with cardiologist, patient is in the process of coming out from ICU to the floor.  # ESRD: TTS at [facility]: Plan for regular dialysis today.  Chronic hypotension noted.  We will attempt to UF as tolerated.  Does not look grossly volume overload.  Right IJ TDC for dialysis.  #Chronic hypotension/volume: Volume acceptable.  Resume midodrine 10 mg twice a day.  I will lower dialysate temperature and albumin as needed for  intradialytic hypotension.  Monitor BP.  She is clinically asymptomatic.  # Anemia of ESRD: Hemoglobin at goal.  She gets Mircera every month.  # Metabolic Bone Disease: Corrected calcium and phosphorus level acceptable.  Resume Auryxia, calcitriol and monitor lab.  Discussed with the cardiology and dialysis nurse.   Karrin Eisenmenger Tanna Furry 08/27/2021, 9:46 AM

## 2021-08-28 ENCOUNTER — Encounter (HOSPITAL_COMMUNITY): Payer: Self-pay | Admitting: Cardiology

## 2021-08-28 DIAGNOSIS — I214 Non-ST elevation (NSTEMI) myocardial infarction: Secondary | ICD-10-CM | POA: Diagnosis not present

## 2021-08-28 LAB — CBC
HCT: 30.6 % — ABNORMAL LOW (ref 36.0–46.0)
Hemoglobin: 9.7 g/dL — ABNORMAL LOW (ref 12.0–15.0)
MCH: 34 pg (ref 26.0–34.0)
MCHC: 31.7 g/dL (ref 30.0–36.0)
MCV: 107.4 fL — ABNORMAL HIGH (ref 80.0–100.0)
Platelets: 156 10*3/uL (ref 150–400)
RBC: 2.85 MIL/uL — ABNORMAL LOW (ref 3.87–5.11)
RDW: 14.1 % (ref 11.5–15.5)
WBC: 9 10*3/uL (ref 4.0–10.5)
nRBC: 0.2 % (ref 0.0–0.2)

## 2021-08-28 LAB — BASIC METABOLIC PANEL
Anion gap: 8 (ref 5–15)
BUN: 13 mg/dL (ref 8–23)
CO2: 27 mmol/L (ref 22–32)
Calcium: 8.5 mg/dL — ABNORMAL LOW (ref 8.9–10.3)
Chloride: 98 mmol/L (ref 98–111)
Creatinine, Ser: 3.15 mg/dL — ABNORMAL HIGH (ref 0.44–1.00)
GFR, Estimated: 16 mL/min — ABNORMAL LOW (ref >60)
Glucose, Bld: 104 mg/dL — ABNORMAL HIGH (ref 70–99)
Potassium: 3.4 mmol/L — ABNORMAL LOW (ref 3.5–5.1)
Sodium: 133 mmol/L — ABNORMAL LOW (ref 135–145)

## 2021-08-28 LAB — GLUCOSE, CAPILLARY
Glucose-Capillary: 95 mg/dL (ref 70–99)
Glucose-Capillary: 230 mg/dL — ABNORMAL HIGH (ref 70–99)

## 2021-08-28 MED ORDER — NITROGLYCERIN 0.4 MG SL SUBL: 0.4000 mg | SUBLINGUAL_TABLET | SUBLINGUAL | 2 refills | Status: AC | PRN

## 2021-08-28 NOTE — Progress Notes (Signed)
CBC resulted and I secure chatted with Stormy Fabian. Hgb  9.7 which is lower. Cardiologist wanted patient to have hgb checked with HD on Tuesday. Patient informed and stated they check her Hbg with most HD sessions. Patient ok to go home. Patient updated and patient much more alert with blood pressure back in normal range.Patient to call family for transportation home.

## 2021-08-28 NOTE — Progress Notes (Signed)
Patient discharged in stable condition with instructions to private vehicle with son. No further questions

## 2021-08-28 NOTE — Progress Notes (Signed)
  Patient has been very sluggish and tired today. Although she will call to get up and ambulated to the bathroom, she stated that some days she is sluggish but not this sluggish. Her bp in her left arm was 74/45 and in her right leg is 89/48. SHe does run low blood pressures. I sent Richardson Dopp a secure chat to make him aware. He stated that if she hasn't had a CBC today to check that so I placed the order. Will continue to monitor patient and touch base with Richardson Dopp

## 2021-08-28 NOTE — Progress Notes (Signed)
[facility] NEPHROLOGY PROGRESS NOTE  Assessment/ Plan: Pt is a 65 y.o. yo female  with past medical history of CAD status post CABG in 2005 and multiple stent placement, PAD, HLD, hypothyroidism, ESRD on HD TTS at [facility] presents with chest pain or shortness of breath, seen as a consultation for the evaluation and management of ESRD.  OP HD orders:  Dialyzes at  [facility] TTS ,  EDW 93.5 kg, 2K, 2.5 ca, 4 hour 15 mins, last OP HD on [facility] for the access.  Mircera 30 mcg q4weeks Venofer 50 mg weekly Calcitriol 0.5 mcg 3x/week  #NSTEMI: Presented with chest pain shortness of breath and EKG changes.  S/p cardiac cath and PCI of the circumflex.  Now chest pain.  Likely dc home today per Cardiology.    # ESRD: TTS at [facility]: s/p HD yesterday with 2.2 L UF, tolerated well. Plan for next HD on 12/13. Right IJ TDC for dialysis.   #Chronic hypotension/volume: Volume acceptable. Continue midodrine 10 mg twice a day.   Monitor BP.  She is clinically asymptomatic.   # Anemia of ESRD: Hemoglobin at goal.  She gets Mircera every month.   # Metabolic Bone Disease: Corrected calcium and phosphorus level acceptable.  Resume Auryxia, calcitriol and monitor lab.  Likely dc home today, next HD 12/13 outpatient.   Subjective: Seen and examined.  Patient reports feeling good and ready to go home.  Denies nausea, vomiting, chest pain, shortness of breath. Objective Vital signs in last 24 hours: Vitals:   08/28/21 0630 08/28/21 0730 08/28/21 0805 08/28/21 0857  BP: (!) 93/44 (!) 95/58  (!) 101/58  Pulse:      Resp:    18  Temp:    99.2 F (37.3 C)  TempSrc:      SpO2:   95% 93%  Weight:      Height:       Weight change:   Intake/Output Summary (Last 24 hours) at 08/28/2021 1041 Last data filed at 08/27/2021 1745 Gross per 24 hour  Intake --  Output 2280 ml  Net -2280 ml       Labs: Basic Metabolic Panel: Recent Labs  Lab 08/26/21 2046  08/27/21 0347 08/28/21 0332  NA 131*  130* 129* 133*  K 4.2  4.2 5.6* 3.4*  CL 96*  96* 96* 98  CO2 24  24 21* 27  GLUCOSE 247*  243* 233* 104*  BUN 29*  29* 35* 13  CREATININE 4.52*  4.44* 5.27* 3.15*  CALCIUM 8.4*  8.3* 8.5* 8.5*  PHOS 4.9*  --   --    Liver Function Tests: Recent Labs  Lab 08/24/21 2233 08/26/21 0340 08/26/21 2046  AST 21 38 91*  ALT 17 23 28   ALKPHOS 88 95 64  BILITOT 0.4 0.4 0.7  PROT 7.0 7.4 5.6*  ALBUMIN 3.5 3.7 2.8*  2.7*   No results for input(s): LIPASE, AMYLASE in the last 168 hours. No results for input(s): AMMONIA in the last 168 hours. CBC: Recent Labs  Lab 08/24/21 2233 08/25/21 0704 08/26/21 0340 08/26/21 2046 08/27/21 0347  WBC 10.3 9.0 18.3* 15.1* 14.9*  NEUTROABS  --  8.1*  --  11.1* 11.0*  HGB 11.5* 12.0 12.7 10.7* 11.1*  HCT 36.5 37.7 39.3 33.9* 33.9*  MCV 108.3* 108.3* 108.3* 108.0* 105.9*  PLT 157 158 215 165 161   Cardiac Enzymes: No results for input(s): CKTOTAL, CKMB, CKMBINDEX, TROPONINI in the last 168 hours. CBG: Recent  Labs  Lab 08/27/21 0636 08/27/21 1128 08/27/21 1827 08/27/21 2041 08/28/21 0632  GLUCAP 243* 179* 135* 226* 95    Iron Studies: No results for input(s): IRON, TIBC, TRANSFERRIN, FERRITIN in the last 72 hours. Studies/Results: CARDIAC CATHETERIZATION  Result Date: 08/26/2021   Ost LAD to Prox LAD lesion is 100% stenosed.  Prox LAD to Mid LAD lesion is 100% stenosed.   Prox RCA lesion is 100% stenosed.  LPAV lesion is 80% stenosed.   1st Mrg-1 lesion is 99% stenosed.  1st Mrg-2 lesion is 75% stenosed.   -----------------------   [name] to Dist LM lesion is 95% stenosed.  Ost Cx to Mid Cx lesion is 99% stenosed.   Balloon angioplasty was performed using a BALLN SAPPHIRE [place] 3.0X12.   Post intervention, there is a 40% residual stenosis in the ostial to mid LCx, and 50% residual stenosis in the ostial LM-distal LM.Marland Kitchen   LIMA-LAD graft was visualized by angiography and is normal in caliber.   Origin to Prox Graft stent is 35% stenosed.   SVG-rPDA:  Ostial stent is 20% stenosed.  Prox Graft-1 proximal stent is 15% stenosed.  Mid Graft stent is 15% stenosed.  Otherwise mild diffuse disease in the both the stented and nondistended portions of the graft.   SVG-OM graft was not visualized due to known occlusion.  Origin to Prox Graft lesion before 1st Mrg  is 100% stenosed.   ---------- HEMODYNAMICS ----------   There is moderate left ventricular systolic dysfunction.   The left ventricular ejection fraction is 35-45% by visual estimate.   There is no aortic valve stenosis. SUMMARY Known severe native vessel disease: Known RCA CTO (not imaged), known LAD CTO.  Also known occluded SVG-OM LM-LCx stent 90 to 95% ISR (likely culprit lesion) with 90%/stable ostial small caliber OM branch.  LCx is a large likely codominant vessel.. (Moderately successful) [place] balloon PTCA of LM-LCx ISR using 3.0 mm [place] balloon further attempted balloon inflations were aborted due to patient having significant anginal pain and blood pressure drops with inflations) -> stenosis reduced to 50%.  TIMI-3 flow 35% ISR of ostial LIMA graft Mild 20% ISR throughout ostial-proximal SVG-PDA graft 10 to 15 mmHg gradient through ostial L-Subclavian Artery Stent Moderate LVEDP with moderate reduced EF of 40 to 45% global HK.  (Recommend echocardiogram to better assess) Recommendations: Aggressive risk factor modification, if symptoms worsen, would then need to become more aggressive with the Left Main stent post dilation. [name], MD .    Medications: Infusions:  sodium chloride     sodium chloride     sodium chloride      Scheduled Medications:  ALPRAZolam  0.25 mg Oral BID   ALPRAZolam  0.5 mg Oral QHS   aspirin EC  81 mg Oral Daily   atorvastatin  80 mg Oral QHS   calcitRIOL  0.5 mcg Oral Q T,Th,Sat-1800   Chlorhexidine Gluconate Cloth  6 each Topical Daily   Chlorhexidine Gluconate Cloth  6 each Topical Q0600    clopidogrel  75 mg Oral Daily   enoxaparin (LOVENOX) injection  30 mg Subcutaneous Q24H   ezetimibe  10 mg Oral QHS   ferric citrate  420 mg Oral TID WC   fluticasone furoate-vilanterol  1 puff Inhalation Daily   gabapentin  400 mg Oral QHS   insulin aspart  0-15 Units Subcutaneous TID WC   insulin aspart protamine- aspart  24 Units Subcutaneous BID WC   levothyroxine  125 mcg Oral QAC breakfast  midodrine  10 mg Oral TID WC   pantoprazole  40 mg Oral BID   ranolazine  500 mg Oral QHS   sodium chloride flush  3 mL Intravenous Q12H   sodium chloride flush  3 mL Intravenous Q12H    have reviewed scheduled and prn medications.  Physical Exam: General:NAD, comfortable Heart:RRR, s1s2 nl Lungs:clear b/l, no crackle Abdomen:soft, Non-tender, non-distended Extremities:No edema Dialysis Access: Right IJ TDC  Jedrek Dinovo Prasad Nadie Fiumara 08/28/2021,10:41 AM  LOS: 2 days

## 2021-08-28 NOTE — Discharge Instructions (Signed)
Have a repeat CBC at Hem[facility] on 08/30/2021.

## 2021-08-28 NOTE — Progress Notes (Signed)
Progress Note  Patient Name: Donna Howe Date of Encounter: 08/28/2021  Primary Cardiologist:   Rozann Lesches, MD   Subjective   NAEO. NO complaints this morning.   Inpatient Medications    Scheduled Meds:  ALPRAZolam  0.25 mg Oral BID   ALPRAZolam  0.5 mg Oral QHS   aspirin EC  81 mg Oral Daily   atorvastatin  80 mg Oral QHS   calcitRIOL  0.5 mcg Oral Q T,Th,Sat-1800   Chlorhexidine Gluconate Cloth  6 each Topical Daily   Chlorhexidine Gluconate Cloth  6 each Topical Q0600   clopidogrel  75 mg Oral Daily   enoxaparin (LOVENOX) injection  30 mg Subcutaneous Q24H   ezetimibe  10 mg Oral QHS   ferric citrate  420 mg Oral TID WC   fluticasone furoate-vilanterol  1 puff Inhalation Daily   gabapentin  400 mg Oral QHS   insulin aspart  0-15 Units Subcutaneous TID WC   insulin aspart protamine- aspart  24 Units Subcutaneous BID WC   levothyroxine  125 mcg Oral QAC breakfast   midodrine  10 mg Oral TID WC   pantoprazole  40 mg Oral BID   ranolazine  500 mg Oral QHS   sodium chloride flush  3 mL Intravenous Q12H   sodium chloride flush  3 mL Intravenous Q12H   Continuous Infusions:  sodium chloride     sodium chloride     sodium chloride     PRN Meds: sodium chloride, Place/Maintain arterial line **AND** sodium chloride, acetaminophen, albuterol, HYDROmorphone (DILAUDID) injection, ipratropium, nitroGLYCERIN, ondansetron (ZOFRAN) IV, sodium chloride flush   Vital Signs    Vitals:   08/28/21 0630 08/28/21 0730 08/28/21 0805 08/28/21 0857  BP: (!) 93/44 (!) 95/58  (!) 101/58  Pulse:      Resp:    18  Temp:    99.2 F (37.3 C)  TempSrc:      SpO2:   95% 93%  Weight:      Height:        Intake/Output Summary (Last 24 hours) at 08/28/2021 1030 Last data filed at 08/27/2021 1745 Gross per 24 hour  Intake --  Output 2280 ml  Net -2280 ml    Filed Weights   08/27/21 1340 08/27/21 1745 08/28/21 0300  Weight: 98 kg 95.7 kg 98 kg    Telemetry    NSR -  Personally Reviewed  ECG    NA - Personally Reviewed  Physical Exam   GEN: No acute distress.   Neck: No  JVD Cardiac: RRR, no murmurs, rubs, or gallops.  Respiratory: Clear  to auscultation bilaterally. GI: Soft, nontender, non-distended  MS: No edema; No deformity.  Neuro:  Nonfocal  Psych: Normal affect   Labs    Chemistry Recent Labs  Lab 08/24/21 2233 08/25/21 0704 08/26/21 0340 08/26/21 2046 08/27/21 0347 08/28/21 0332  NA 137   < > 135 131*  130* 129* 133*  K 4.2   < > 3.8 4.2  4.2 5.6* 3.4*  CL 98   < > 93* 96*  96* 96* 98  CO2 26   < > 30 24  24  21* 27  GLUCOSE 210*   < > 439* 247*  243* 233* 104*  BUN 32*   < > 25* 29*  29* 35* 13  CREATININE 5.62*   < > 3.93* 4.52*  4.44* 5.27* 3.15*  CALCIUM 9.6   < > 9.9 8.4*  8.3* 8.5* 8.5*  PROT 7.0  --  7.4 5.6*  --   --   ALBUMIN 3.5  --  3.7 2.8*  2.7*  --   --   AST 21  --  38 91*  --   --   ALT 17  --  23 28  --   --   ALKPHOS 88  --  95 64  --   --   BILITOT 0.4  --  0.4 0.7  --   --   GFRNONAA 8*   < > 12* 10*  11* 9* 16*  ANIONGAP 13   < > 12 11  10 12 8    < > = values in this interval not displayed.      Hematology Recent Labs  Lab 08/26/21 0340 08/26/21 2046 08/27/21 0347  WBC 18.3* 15.1* 14.9*  RBC 3.63* 3.14* 3.20*  HGB 12.7 10.7* 11.1*  HCT 39.3 33.9* 33.9*  MCV 108.3* 108.0* 105.9*  MCH 35.0* 34.1* 34.7*  MCHC 32.3 31.6 32.7  RDW 14.3 14.3 14.2  PLT 215 165 161     Cardiac EnzymesNo results for input(s): TROPONINI in the last 168 hours. No results for input(s): TROPIPOC in the last 168 hours.   BNP Recent Labs  Lab 08/25/21 0704  BNP 1,086.0*      DDimer  Recent Labs  Lab 08/25/21 0704  DDIMER 0.49      Radiology    CARDIAC CATHETERIZATION  Result Date: 08/26/2021   Ost LAD to Prox LAD lesion is 100% stenosed.  Prox LAD to Mid LAD lesion is 100% stenosed.   Prox RCA lesion is 100% stenosed.  LPAV lesion is 80% stenosed.   1st Mrg-1 lesion is 99% stenosed.   1st Mrg-2 lesion is 75% stenosed.   -----------------------   [name] to Dist LM lesion is 95% stenosed.  Ost Cx to Mid Cx lesion is 99% stenosed.   Balloon angioplasty was performed using a BALLN SAPPHIRE [place] 3.0X12.   Post intervention, there is a 40% residual stenosis in the ostial to mid LCx, and 50% residual stenosis in the ostial LM-distal LM.Marland Kitchen   LIMA-LAD graft was visualized by angiography and is normal in caliber.  Origin to Prox Graft stent is 35% stenosed.   SVG-rPDA:  Ostial stent is 20% stenosed.  Prox Graft-1 proximal stent is 15% stenosed.  Mid Graft stent is 15% stenosed.  Otherwise mild diffuse disease in the both the stented and nondistended portions of the graft.   SVG-OM graft was not visualized due to known occlusion.  Origin to Prox Graft lesion before 1st Mrg  is 100% stenosed.   ---------- HEMODYNAMICS ----------   There is moderate left ventricular systolic dysfunction.   The left ventricular ejection fraction is 35-45% by visual estimate.   There is no aortic valve stenosis. SUMMARY Known severe native vessel disease: Known RCA CTO (not imaged), known LAD CTO.  Also known occluded SVG-OM LM-LCx stent 90 to 95% ISR (likely culprit lesion) with 90%/stable ostial small caliber OM branch.  LCx is a large likely codominant vessel.. (Moderately successful) [place] balloon PTCA of LM-LCx ISR using 3.0 mm [place] balloon further attempted balloon inflations were aborted due to patient having significant anginal pain and blood pressure drops with inflations) -> stenosis reduced to 50%.  TIMI-3 flow 35% ISR of ostial LIMA graft Mild 20% ISR throughout ostial-proximal SVG-PDA graft 10 to 15 mmHg gradient through ostial L-Subclavian Artery Stent Moderate LVEDP with moderate reduced EF of 40 to 45% global HK.  (Recommend echocardiogram to  better assess) Recommendations: Aggressive risk factor modification, if symptoms worsen, would then need to become more aggressive with the Left Main stent post dilation. [name], MD .    Cardiac Studies   Cath  Diagnostic Dominance: Co-dominant Intervention    Patient Profile     65 y.o. female with past medical history of CAD (s/p CABG in 2005 with multiple PCI's since including PTCA to SVG-OM1 in 01/2004, PTCA/DES to LM and prox LCx in 03/2004 and DES to SVG-OM1, PTCA/DES to SVG-OM in 01/2011, PTCA/DES to LM and LCx in 2015, cutting balloon angioplasty to prox Cx and DES to SVG-RCA in 05/2017, NSTEMI in 03/2018 with DES to mid-RCA and DES to LIMA-LAD, NSTEMI in 06/2020 and medical management recommended as she was felt to be high-risk for re-do CABG and poor target vessels, s/p NSTEMI in 12/2020 with DESx2 to SVG-RCA, NSTEMI in 05/2021 with ISR of stent in the SVG-RCA and treated with angioplasty), HTN, HLD, Type 2 DM, carotid artery stenosis (s/p R CEA in 2014), left subclavian stenosis (s/p stent in 07/2020) and ESRD who was seen 08/26/2021 for the evaluation of NSTEMI at the request of Dr. Sedonia Small.  Assessment & Plan    #NSTEMI:    HD stable. NO CP. Cont aspirin, plavix Cont zetia Cont lipitor   #CAD See above.   #Hypotension - unable to titrate GDMT    #IDDM:  On insulin with SSI.    #COPD:  Continue with current meds.     #ESRD Had HD yesterday  OK to discharge today.   For questions or updates, please contact [facility] Please consult [email or web address] for contact info under Cardiology/STEMI.   Signed, Vickie Epley, MD  08/28/2021, 10:30 AM

## 2021-08-28 NOTE — Progress Notes (Signed)
    Patient was cleared for discharge earlier today but felt sluggish around the time of anticipated discharge and BP was soft at 89/48 at that time. BP has improved, at 137/56 on most recent check and the nurse reports she has been feeling well. A repeat CBC was ordered earlier this morning her hemoglobin has declined from 11.1 to 9.7 but no reports of active bleeding. Reviewed with Dr. Quentin Ore and he recommends a CBC when she has dialysis this coming Tuesday as she has a chronic anemia in the setting of ESRD. Cleared for discharge.  Signed, Erma Heritage, PA-C 08/28/2021, 3:07 PM Pager: [phone]

## 2021-08-28 NOTE — Discharge Summary (Signed)
Discharge Summary    Patient ID: Donna Howe MRN: [id number]; DOB: July 20, 1956  Admit date: 08/26/2021 Discharge date: 08/28/2021  PCP:  Curlene Labrum, MD   [facility] Providers Cardiologist:  Rozann Lesches, MD        Discharge Diagnoses    Principal Problem:   NSTEMI (non-ST elevated myocardial infarction) [facility]) Active Problems:   CAD (coronary artery disease)   HFmrEF (heart failure with mildly reduced EF)   ESRD (end stage renal disease) ([place])   Insulin dependent type 2 diabetes mellitus ([place])   Mixed hyperlipidemia   Chronic hypotension   Stenosis of left subclavian artery (HCC)   Carotid artery disease (HCC)   Anemia in chronic kidney disease   COPD (chronic obstructive pulmonary disease) ([place])   Diagnostic Studies/Procedures    LEFT HEART CATH AND CORS/GRAFTS ANGIOGRAPHY 08/26/2021 Narrative   Ost LAD to Prox LAD lesion is 100% stenosed.  Prox LAD to Mid LAD lesion is 100% stenosed.   Prox RCA lesion is 100% stenosed.  LPAV lesion is 80% stenosed.   1st Mrg-1 lesion is 99% stenosed.  1st Mrg-2 lesion is 75% stenosed.   -----------------------   Donna Howe to Dist LM lesion is 95% stenosed.  Ost Cx to Mid Cx lesion is 99% stenosed.   Balloon angioplasty was performed using a BALLN SAPPHIRE [place] 3.0X12.   Post intervention, there is a 40% residual stenosis in the ostial to mid LCx, and 50% residual stenosis in the ostial LM-distal LM.Marland Kitchen   LIMA-LAD graft was visualized by angiography and is normal in caliber.  Origin to Prox Graft stent is 35% stenosed.   SVG-rPDA:  Ostial stent is 20% stenosed.  Prox Graft-1 proximal stent is 15% stenosed.  Mid Graft stent is 15% stenosed.  Otherwise mild diffuse disease in the both the stented and nondistended portions of the graft.   SVG-OM graft was not visualized due to known occlusion.  Origin to Prox Graft lesion before 1st Mrg  is 100% stenosed.   ---------- HEMODYNAMICS ----------   There is moderate left ventricular  systolic dysfunction.   The left ventricular ejection fraction is 35-45% by visual estimate.   There is no aortic valve stenosis.  SUMMARY Known severe native vessel disease: Known RCA CTO (not imaged), known LAD CTO.  Also known occluded SVG-OM LM-LCx stent 90 to 95% ISR (likely culprit lesion) with 90%/stable ostial small caliber OM branch.  LCx is a large likely codominant vessel.. (Moderately successful) [place] balloon PTCA of LM-LCx ISR using 3.0 mm [place] balloon further attempted balloon inflations were aborted due to patient having significant anginal pain and blood pressure drops with inflations) -> stenosis reduced to 50%.  TIMI-3 flow 35% ISR of ostial LIMA graft Mild 20% ISR throughout ostial-proximal SVG-PDA graft 10 to 15 mmHg gradient through ostial L-Subclavian Artery Stent Moderate LVEDP with moderate reduced EF of 40 to 45% global HK.  (Recommend echocardiogram to better assess)  Recommendations: Aggressive risk factor modification, if symptoms worsen, would then need to become more aggressive with the Left Main stent post dilation.    _________   History of Present Illness     Donna Howe is a 65 y.o. female with coronary artery disease (s/p CABG in 2005 with multiple PCI's since including PTCA to SVG-OM1 in 01/2004, PTCA/DES to LM and prox LCx in 03/2004 and DES to SVG-OM1, PTCA/DES to SVG-OM in 01/2011, PTCA/DES to LM and LCx in 2015, cutting balloon angioplasty to prox Cx and DES to SVG-RCA  in 05/2017, NSTEMI in 03/2018 with DES to mid-RCA and DES to LIMA-LAD, NSTEMI in 06/2020 with medical management recommended as she was felt to be high-risk for re-do CABG and poor target vessels, s/p NSTEMI in 12/2020 with DESx2 to SVG-RCA, NSTEMI in 05/2021 with ISR of stent in the SVG-RCA and treated with angioplasty), hypertension, hyperlipidemia, diabetes mellitus, carotid artery stenosis (s/p R CEA in 2014), left subclavian stenosis (s/p stent in 07/2020) and ESRD on TThS dialysis  who presented to the ED at [facility] with worsening chest pain and dyspnea on exertion.  She had been to the ED twice with symptoms felt to be consistent with AECOPD prior to this.  In the ED on 12/9, she received SL NTG and Dilaudid with improvement in chest pain.  She was started on IV Heparin and transferred to [facility].  At [facility], her hs-Trop was significant elevated (1437>>6550>>greater than 24K).  She was admitted for further evaluation and management.    Hospital Course     Consultants: Nephrology    She was admitted for further evaluation and management.  She underwent cardiac catheterization on 08/26/21 with Dr. Ellyn Hack which demonstrated severe native vessel coronary artery disease with known CTO of the RCA, LAD and S-OM.  There was 90-95% in-stent restenosis in the LM-LCx stent and stable 90% stenosis in the ostium of a small caliber OM branch, L-LAD patent with 35% stenosis, patent ostial-proximal S-PDA with mild 20% ISR.  There was a 10-15 mmHg gradient through the L Subclavian stent.  LVEDP was 26 with EF 40-45.  She was treated with POBA of the LM-LCx stent with reducing in stenosis to 50%.  Her post PCI course was uneventful.  She was seen by Nephrology to continue dialysis.  She was seen by Dr. Quentin Ore this morning.  She is improved and felt to be stable for DC to home.      Did the patient have an acute coronary syndrome (MI, NSTEMI, STEMI, etc) this admission?:  Yes                               AHA/ACC Clinical Performance & Quality Measures: Aspirin prescribed? - Yes ADP Receptor Inhibitor (Plavix/Clopidogrel, Brilinta/Ticagrelor or Effient/Prasugrel) prescribed (includes medically managed patients)? - Yes Beta Blocker prescribed? - No - hypotension High Intensity Statin (Lipitor 40-80mg  or Crestor 20-40mg ) prescribed? - Yes EF assessed during THIS hospitalization? - Yes For EF <40%, was ACEI/ARB prescribed? - No - Reason:  ESRD, hypotension For EF <40%, Aldosterone  Antagonist (Spironolactone or Eplerenone) prescribed? - No - Reason:  ESRD, hypotension Cardiac Rehab Phase II ordered (including medically managed patients)? - Yes     _____________  Discharge Vitals Blood pressure (!) 101/58, pulse 91, temperature 99.2 F (37.3 C), resp. rate 18, height 5\' 5"  (1.651 m), weight 98 kg, SpO2 93 %.  Filed Weights   08/27/21 1340 08/27/21 1745 08/28/21 0300  Weight: 98 kg 95.7 kg 98 kg    Labs & Radiologic Studies    CBC Recent Labs    08/26/21 2046 08/27/21 0347  WBC 15.1* 14.9*  NEUTROABS 11.1* 11.0*  HGB 10.7* 11.1*  HCT 33.9* 33.9*  MCV 108.0* 105.9*  PLT 165 160   Basic Metabolic Panel Recent Labs    08/26/21 2046 08/27/21 0347 08/28/21 0332  NA 131*  130* 129* 133*  K 4.2  4.2 5.6* 3.4*  CL 96*  96* 96* 98  CO2 24  24 21* 27  GLUCOSE 247*  243* 233* 104*  BUN 29*  29* 35* 13  CREATININE 4.52*  4.44* 5.27* 3.15*  CALCIUM 8.4*  8.3* 8.5* 8.5*  MG 1.9 2.1  --   PHOS 4.9*  --   --    Liver Function Tests Recent Labs    08/26/21 0340 08/26/21 2046  AST 38 91*  ALT 23 28  ALKPHOS 95 64  BILITOT 0.4 0.7  PROT 7.4 5.6*  ALBUMIN 3.7 2.8*  2.7*    High Sensitivity Troponin:   Recent Labs  Lab 08/25/21 0704 08/25/21 0840 08/26/21 0340 08/26/21 0608 08/27/21 0347  TROPONINIHS 73* 95* 1,437* 6,550* >24,000*   Hemoglobin A1C Recent Labs    08/26/21 1024  HGBA1C 7.1*   Fasting Lipid Panel Recent Labs    08/27/21 0347  CHOL 94  HDL 44  LDLCALC 21  TRIG 144  CHOLHDL 2.1    _____________  DG Chest 2 View  Result Date: 08/24/2021 CLINICAL DATA:  Shortness of breath  EXAM: CHEST - 2 VIEW COMPARISON:  07/12/2021 FINDINGS: Post sternotomy changes. Right-sided central venous catheter with tip at the cavoatrial region. Cardiomegaly with vascular congestion and probable mild edema. No pleural effusion or pneumothorax.  IMPRESSION: Cardiomegaly with vascular congestion and probable mild edema.  Electronically  Signed   By: Donavan Foil M.D.   On: 08/24/2021 22:05     DG Chest Port 1 View  Result Date: 08/26/2021 CLINICAL DATA:  Chest pain and shortness of breath  EXAM: PORTABLE CHEST 1 VIEW COMPARISON:  08/25/2021  FINDINGS: Cardiac shadow is stable. Postsurgical changes are noted. Dialysis catheter is noted. Increased vascular congestion and interstitial edema is noted when compared with the previous day likely related to volume overload. No focal infiltrate is seen. Left subclavian stent is again noted.  IMPRESSION: Increased vascular congestion and interstitial edema consistent with volume overload.  Electronically Signed   By: Inez Catalina M.D.   On: 08/26/2021 02:23    DG Chest Port 1 View  Result Date: 08/25/2021 CLINICAL DATA:  65 year old female with shortness of breath.  EXAM: PORTABLE CHEST - 1 VIEW COMPARISON:  08/24/2021, 07/14/2021  FINDINGS: The mediastinal contours are within normal limits. Unchanged cardiomegaly. Atherosclerotic calcification of the aortic arch. Unchanged position of indwelling right IJ tunneled hemodialysis catheter with the catheter tip near the cavoatrial junction. Unchanged appearance of left subclavian ostial stent. Similar appearing mild cephalization of pulmonary vasculature. Mild hazy bibasilar opacities, slightly increased from comparison. No pleural effusion or pneumothorax. No acute osseous abnormality. Median sternotomy wires appear intact.  IMPRESSION:  1. Slightly worsened mild pulmonary edema.  2. Unchanged cardiomegaly.  3.  Aortic Atherosclerosis (ICD10-I70.0).  Electronically Signed   By: Ruthann Cancer M.D.   On: 08/25/2021 07:58       Disposition   Pt is being discharged home today in good condition.  Follow-up Plans & Appointments     Follow-up Information     Burdine, Virgina Evener, MD Follow up.   Specialty: Family Medicine Contact information: [place] [address] [phone]         Erma Heritage, PA-C Follow up  on 09/21/2021.   Specialties: Physician Assistant, Cardiology Why: 3:30 PM Contact information: [place] [address] [phone]                Discharge Instructions     Amb Referral to Cardiac Rehabilitation   Complete by: As directed  Diagnosis:  PTCA NSTEMI     After initial evaluation and assessments completed: Virtual Based Care may be provided alone or in conjunction with Phase 2 Cardiac Rehab based on patient barriers.: Yes   Diet - low sodium heart healthy   Complete by: As directed    Diet Carb Modified   Complete by: As directed    Driving Restrictions   Complete by: As directed    No driving for 1 week   Increase activity slowly   Complete by: As directed    Lifting restrictions   Complete by: As directed    No lifting over 5 lbs for 2 weeks   Sexual Activity Restrictions   Complete by: As directed    None for 2 weeks       Discharge Medications   Allergies as of 08/28/2021       Reactions   Penicillins Other (See Comments)   REACTION: Unknown, told as a child Has patient had a PCN reaction causing immediate rash, facial/tongue/throat swelling, SOB or lightheadedness with hypotension: Unknown Has patient had a PCN reaction causing severe rash involving mucus membranes or skin necrosis: Unknown Has patient had a PCN reaction that required hospitalization: Unknown Has patient had a PCN reaction occurring within the last 10 years: No If all of the above answers are "NO", then may proceed with Cephalosporin use.   Beta Adrenergic Blockers Other (See Comments)   Hypotension        Medication List     TAKE these medications    Albuterol Sulfate 108 (90 Base) MCG/ACT Aepb Commonly known as: PROAIR RESPICLICK Inhale 2 puffs into the lungs every 6 (six) hours as needed (Shortness of breath).   albuterol (2.5 MG/3ML) 0.083% nebulizer solution Commonly known as: PROVENTIL Take 3 mLs (2.5 mg total) by nebulization every 2 (two)  hours as needed for wheezing.   allopurinol 100 MG tablet Commonly known as: Zyloprim Take one tablet after hemodialysis, on Tuesday, Thursday and Saturday. What changed:  how much to take how to take this when to take this additional instructions   ALPRAZolam 0.5 MG tablet Commonly known as: XANAX Take 0.25-0.5 mg by mouth See admin instructions. Take 0.25 in the morning and evening and 0.5 mg at bedtime   aspirin EC 81 MG tablet Take 81 mg by mouth daily.   atorvastatin 80 MG tablet Commonly known as: LIPITOR Take 1 tablet (80 mg total) by mouth every evening. What changed: when to take this   budesonide-formoterol 80-4.5 MCG/ACT inhaler Commonly known as: Symbicort Take 2 puffs first thing in am and then another 2 puffs about 12 hours later.   clopidogrel 75 MG tablet Commonly known as: PLAVIX Take 1 tablet (75 mg total) by mouth daily.   cyclobenzaprine 5 MG tablet Commonly known as: FLEXERIL Take 1 tablet (5 mg total) by mouth 2 (two) times daily with a meal.   diclofenac Sodium 1 % Gel Commonly known as: VOLTAREN Apply 2 g topically daily as needed (Pain).   ezetimibe 10 MG tablet Commonly known as: ZETIA Take 1 tablet (10 mg total) by mouth daily. What changed: when to take this   ferric citrate 1 GM 210 MG(Fe) tablet Commonly known as: AURYXIA Take 420 mg by mouth 3 (three) times daily with meals.   gabapentin 400 MG capsule Commonly known as: NEURONTIN Take 1 capsule (400 mg total) by mouth at bedtime.   ipratropium 17 MCG/ACT inhaler Commonly known as: ATROVENT HFA Inhale 2 puffs into  the lungs every 4 (four) hours as needed for wheezing.   levothyroxine 125 MCG tablet Commonly known as: SYNTHROID Take 125 mcg by mouth daily before breakfast.   midodrine 10 MG tablet Commonly known as: PROAMATINE Take 1 tablet (10 mg total) by mouth 2 (two) times daily with a meal.   MIRCERA IJ Mircera   multivitamin Tabs tablet Take 1 tablet by mouth at  bedtime.   nitroGLYCERIN 0.4 MG SL tablet Commonly known as: NITROSTAT Place 1 tablet (0.4 mg total) under the tongue every 5 (five) minutes x 3 doses as needed for chest pain.   NovoLIN 70/30 Kwikpen (70-30) 100 UNIT/ML KwikPen Generic drug: insulin isophane & regular human KwikPen Inject 35 Units into the skin in the morning and at bedtime. What changed: how much to take   ondansetron 8 MG tablet Commonly known as: ZOFRAN Take 8 mg by mouth every 8 (eight) hours as needed for nausea.   OneTouch Ultra test strip Generic drug: glucose blood Check blood glucose THREE TIMES DAILY   pantoprazole 40 MG tablet Commonly known as: PROTONIX Take 1 tablet (40 mg total) by mouth 2 (two) times daily.   ranolazine 500 MG 12 hr tablet Commonly known as: RANEXA Take 1 tablet (500 mg total) by mouth at bedtime.   VENOFER IV Inject 50 mLs into the vein once a week. At dialysis   Vitamin D (Ergocalciferol) 1.25 MG (50000 UNIT) Caps capsule Commonly known as: DRISDOL Take 50,000 Units by mouth every Sunday.   VITAMIN D PO Take 50 mcg by mouth See admin instructions. Tue, thur, sat at dialysis           Outstanding Labs/Studies     Duration of Discharge Encounter  Greater than 30 minutes including physician time.  Signed, Richardson Dopp, PA-C 08/28/2021, 11:12 AM

## 2021-08-29 ENCOUNTER — Encounter (HOSPITAL_COMMUNITY): Payer: Self-pay | Admitting: Cardiology

## 2021-08-29 MED FILL — Heparin Sod (Porcine)-NaCl IV Soln 1000 Unit/500ML-0.9%: INTRAVENOUS | Qty: 1000 | Status: AC

## 2021-08-29 NOTE — TOC Transition Note (Signed)
Transition of care contact from inpatient facility  Date of discharge: 08/28/21 Date of contact: 08/29/21 Method: Attempted Phone Call Spoke to: No Answer  Patient contacted to discuss transition of care from recent inpatient hospitalization but patient did not pick up the phone. A voicemail was left to contact the [facility] HD unit [phone] for any questions.   Patient will follow up with her outpatient HD unit on: Tuesday 08/30/21 at [facility]   Tobie Poet, NP

## 2021-08-30 DIAGNOSIS — D509 Iron deficiency anemia, unspecified: Secondary | ICD-10-CM | POA: Diagnosis not present

## 2021-08-30 DIAGNOSIS — Z992 Dependence on renal dialysis: Secondary | ICD-10-CM | POA: Diagnosis not present

## 2021-08-30 DIAGNOSIS — N186 End stage renal disease: Secondary | ICD-10-CM | POA: Diagnosis not present

## 2021-08-30 DIAGNOSIS — N2581 Secondary hyperparathyroidism of renal origin: Secondary | ICD-10-CM | POA: Diagnosis not present

## 2021-08-30 DIAGNOSIS — D689 Coagulation defect, unspecified: Secondary | ICD-10-CM | POA: Diagnosis not present

## 2021-08-31 NOTE — Progress Notes (Signed)
Cardiology Office Note    Date:  09/01/2021   ID:  Donna Howe, DOB 1956-04-19, MRN [id number]  PCP:  Curlene Labrum, MD  Cardiologist: Rozann Lesches, MD    Chief Complaint  Patient presents with   Hospitalization Follow-up    History of Present Illness:    Donna Howe is a 65 y.o. female  with past medical history of CAD (s/p CABG in 2005 with multiple PCI's since including PTCA to SVG-OM1 in 01/2004, PTCA/DES to LM and prox LCx in 03/2004 and DES to SVG-OM1, PTCA/DES to SVG-OM in 01/2011, PTCA/DES to LM and LCx in 2015, cutting balloon angioplasty to prox Cx and DES to SVG-RCA in 05/2017, NSTEMI in 03/2018 with DES to mid-RCA and DES to LIMA-LAD, NSTEMI in 06/2020 and medical management recommended as she was felt to be high-risk for re-do CABG and poor target vessels, s/p NSTEMI in 12/2020 with DESx2 to SVG-RCA, NSTEMI in 05/2021 with ISR of stent in the SVG-RCA and treated with angioplasty), HTN, HLD, Type 2 DM, carotid artery stenosis (s/p R CEA in 2014), left subclavian stenosis (s/p stent in 07/2020) and ESRD who presents to the office today for hospital follow-up.   She most recently presented to [facility] ED on 08/26/2021 for evaluation of worsening chest pain and dyspnea on exertion. Was found to have an NSTEMI with initial troponin at 1437 but peaked at greater than 24,000. She was transferred to [facility] for a cardiac catheterization and this showed 90 to 95% ISR of the LM/LCx stent and was treated with moderately successful balloon angioplasty reducing the stenosis to 50%. She did undergo dialysis on 08/27/2021 and tolerated well. She did have hypotension during admission which limited titration of GDMT. Hemoglobin had declined to 9.7 on the day of discharge and a follow-up CBC was recommended when she had dialysis the following Tuesday.  In talking with the patient today, she reports still having some intermittent chest pain since hospital discharge but not as  severe as her prior symptoms and resolves with SL NTGx1. Using less than 1 a day and previously using several a day (went through one bottle in a week prior to admission). Breathing has been stable and no recent orthopnea, PND or pitting edema. Reports good compliance with ASA and Plavix and denies any evidence of active bleeding. Hgb improved to 10.2 when checked at HD on 08/30/2021.   Past Medical History:  Diagnosis Date   Anemia    Anxiety    Asthma    CAD (coronary artery disease)    Multivessel s/p CABG 2005, numerous PCIs since that time and documented graft disease   Carotid artery disease ([place])    R CEA   ESRD on hemodialysis ([place])    Essential hypertension    Gout    HFmrEF (heart failure with mildly reduced EF)    EF ~ 40 by LV gram at cath 08/26/2021   History of blood transfusion    Hyperlipidemia    Hypothyroidism    Myocardial infarction [facility])    PAD (peripheral artery disease) ([place])    Dr. Kellie Simmering   Pneumonia 09/2019, 11/2019   S/P angioplasty with stent- DES to mRCA and to LIMA to LAD with DES 04/09/18.   04/10/2018   SBO (small bowel obstruction) ([place]) 2011   Status post lysis of adhesions & hernia repair   Sinus bradycardia    Type 2 diabetes mellitus ([place])    Umbilical hernia     Past  Surgical History:  Procedure Laterality Date   AORTIC ARCH ANGIOGRAPHY N/A 08/02/2020   Procedure: AORTIC ARCH ANGIOGRAPHY;  Surgeon: Waynetta Sandy, MD;  Location: [place] [facility];  Service: Cardiovascular;  Laterality: N/A;  Lt upper extermity   AV FISTULA PLACEMENT Left 06/29/2020   Procedure: LEFT ARM ARTERIOVENOUS (AV) FISTULA;  Surgeon: Waynetta Sandy, MD;  Location: [place];  Service: Vascular;  Laterality: Left;  ARM   AV FISTULA PLACEMENT Right 05/11/2021   Procedure: RIGHT BRACHIOCEPHALIC ARTERIOVENOUS (AV) FISTULA CREATION;  Surgeon: Waynetta Sandy, MD;  Location: [facility] OR;  Service: Vascular;  Laterality: Right;   [place]  2010   CORONARY ARTERY BYPASS GRAFT  2005   CORONARY BALLOON ANGIOPLASTY N/A 05/31/2017   Procedure: CORONARY BALLOON ANGIOPLASTY;  Surgeon: Jettie Booze, MD;  Location: [place] [facility];  Service: Cardiovascular;  Laterality: N/A;   CORONARY BALLOON ANGIOPLASTY N/A 06/14/2021   Procedure: CORONARY BALLOON ANGIOPLASTY;  Surgeon: Jettie Booze, MD;  Location: [place] [facility];  Service: Cardiovascular;  Laterality: N/A;   CORONARY BALLOON ANGIOPLASTY N/A 08/26/2021   Procedure: CORONARY BALLOON ANGIOPLASTY;  Surgeon: Leonie Man, MD;  Location: [place] [facility];  Service: Cardiovascular;  Laterality: N/A;   CORONARY STENT INTERVENTION N/A 05/31/2017   Procedure: CORONARY STENT INTERVENTION;  Surgeon: Jettie Booze, MD;  Location: [place] [facility];  Service: Cardiovascular;  Laterality: N/A;   CORONARY STENT INTERVENTION N/A 04/09/2018   Procedure: CORONARY STENT INTERVENTION;  Surgeon: Jettie Booze, MD;  Location: [place] [facility];  Service: Cardiovascular;  Laterality: N/A;  SVG RCA   CORONARY STENT INTERVENTION N/A 01/10/2021   Procedure: CORONARY STENT INTERVENTION;  Surgeon: Leonie Man, MD;  Location: [place] [facility];  Service: Cardiovascular;  Laterality: N/A;   CORONARY/GRAFT ACUTE MI REVASCULARIZATION N/A 02/24/2021   Procedure: Coronary/Graft Acute MI Revascularization;  Surgeon: Jettie Booze, MD;  Location: [place] [facility];  Service: Cardiovascular;  Laterality: N/A;   ENDARTERECTOMY Right 04/18/2013   Procedure: ENDARTERECTOMY CAROTID;  Surgeon: Mal Misty, MD;  Location: [place];  Service: Vascular;  Laterality: Right;   HERNIA REPAIR  1989   Incisional hernia repair x2  03/04/2010   Laparoscopic with 35cm mesh by Dr Ronnald Collum   IR [place] CV LINE RIGHT  06/21/2020   IR US GUIDE VASC ACCESS RIGHT  06/21/2020   LEFT HEART CATH AND CORS/GRAFTS ANGIOGRAPHY N/A 05/31/2017   Procedure: LEFT HEART CATH AND  CORS/GRAFTS ANGIOGRAPHY;  Surgeon: Jettie Booze, MD;  Location: [place] [facility];  Service: Cardiovascular;  Laterality: N/A;   LEFT HEART CATH AND CORS/GRAFTS ANGIOGRAPHY N/A 04/08/2018   Procedure: LEFT HEART CATH AND CORS/GRAFTS ANGIOGRAPHY;  Surgeon: Jettie Booze, MD;  Location: [place] [facility];  Service: Cardiovascular;  Laterality: N/A;   LEFT HEART CATH AND CORS/GRAFTS ANGIOGRAPHY N/A 06/22/2020   Procedure: LEFT HEART CATH AND CORS/GRAFTS ANGIOGRAPHY;  Surgeon: Belva Crome, MD;  Location: [place] [facility];  Service: Cardiovascular;  Laterality: N/A;   LEFT HEART CATH AND CORS/GRAFTS ANGIOGRAPHY N/A 01/10/2021   Procedure: LEFT HEART CATH AND CORS/GRAFTS ANGIOGRAPHY;  Surgeon: Leonie Man, MD;  Location: [place] [facility];  Service: Cardiovascular;  Laterality: N/A;   LEFT HEART CATH AND CORS/GRAFTS ANGIOGRAPHY N/A 02/24/2021   Procedure: LEFT HEART CATH AND CORS/GRAFTS ANGIOGRAPHY;  Surgeon: Jettie Booze, MD;  Location: [place] [facility];  Service:  Cardiovascular;  Laterality: N/A;   LEFT HEART CATH AND CORS/GRAFTS ANGIOGRAPHY N/A 06/14/2021   Procedure: LEFT HEART CATH AND CORS/GRAFTS ANGIOGRAPHY;  Surgeon: Jettie Booze, MD;  Location: [place] [facility];  Service: Cardiovascular;  Laterality: N/A;   LEFT HEART CATH AND CORS/GRAFTS ANGIOGRAPHY N/A 08/26/2021   Procedure: LEFT HEART CATH AND CORS/GRAFTS ANGIOGRAPHY;  Surgeon: Leonie Man, MD;  Location: [place] [facility];  Service: Cardiovascular;  Laterality: N/A;   LEFT HEART CATHETERIZATION WITH CORONARY ANGIOGRAM N/A 12/19/2012   Procedure: LEFT HEART CATHETERIZATION WITH CORONARY ANGIOGRAM;  Surgeon: Josue Hector, MD;  Location: [facility] CATH LAB;  Service: Cardiovascular;  Laterality: N/A;   LEFT HEART CATHETERIZATION WITH CORONARY/GRAFT ANGIOGRAM N/A 04/19/2013   Procedure: LEFT HEART CATHETERIZATION WITH Beatrix Fetters;  Surgeon: Lorretta Harp, MD;  Location: [facility] CATH LAB;  Service:  Cardiovascular;  Laterality: N/A;   LIGATION OF ARTERIOVENOUS  FISTULA Left 09/15/2020   Procedure: LIGATION OF LEFT ARM ARTERIOVENOUS  FISTULA;  Surgeon: Waynetta Sandy, MD;  Location: [place];  Service: Vascular;  Laterality: Left;   PATCH ANGIOPLASTY Right 04/18/2013   Procedure: PATCH ANGIOPLASTY Right Internal Carotid Artery;  Surgeon: Mal Misty, MD;  Location: [place];  Service: Vascular;  Laterality: Right;   PERCUTANEOUS CORONARY STENT INTERVENTION (PCI-S) Right 12/19/2012   Procedure: PERCUTANEOUS CORONARY STENT INTERVENTION (PCI-S);  Surgeon: Josue Hector, MD;  Location: [facility] CATH LAB;  Service: Cardiovascular;  Laterality: Right;   PERIPHERAL VASCULAR INTERVENTION Left 08/02/2020   Procedure: PERIPHERAL VASCULAR INTERVENTION;  Surgeon: Waynetta Sandy, MD;  Location: [place] [facility];  Service: Cardiovascular;  Laterality: Left;  Left subclavian   PERIPHERAL VASCULAR INTERVENTION  02/24/2021   Procedure: PERIPHERAL VASCULAR INTERVENTION;  Surgeon: Marty Heck, MD;  Location: [place] [facility];  Service: Vascular;;   SHOULDER SURGERY      Current Medications: Outpatient Medications Prior to Visit  Medication Sig Dispense Refill   albuterol (PROVENTIL) (2.5 MG/3ML) 0.083% nebulizer solution Take 3 mLs (2.5 mg total) by nebulization every 2 (two) hours as needed for wheezing. 75 mL 12   Albuterol Sulfate 108 (90 Base) MCG/ACT AEPB Inhale 2 puffs into the lungs every 6 (six) hours as needed (Shortness of breath).     allopurinol (ZYLOPRIM) 100 MG tablet Take one tablet after hemodialysis, on Tuesday, Thursday and Saturday. (Patient taking differently: Take 100 mg by mouth Every Tuesday,Thursday,and Saturday with dialysis. After dialysis) 30 tablet 11   ALPRAZolam (XANAX) 0.5 MG tablet Take 0.25-0.5 mg by mouth See admin instructions. Take 0.25 in the morning and evening and 0.5 mg at bedtime     aspirin EC 81 MG tablet Take 81 mg by mouth daily.       atorvastatin (LIPITOR) 80 MG tablet Take 1 tablet (80 mg total) by mouth every evening. (Patient taking differently: Take 80 mg by mouth at bedtime.)     budesonide-formoterol (SYMBICORT) 80-4.5 MCG/ACT inhaler Take 2 puffs first thing in am and then another 2 puffs about 12 hours later. 1 each 11   clopidogrel (PLAVIX) 75 MG tablet Take 1 tablet (75 mg total) by mouth daily. 90 tablet 3   diclofenac Sodium (VOLTAREN) 1 % GEL Apply 2 g topically daily as needed (Pain).     ezetimibe (ZETIA) 10 MG tablet Take 1 tablet (10 mg total) by mouth daily. (Patient taking differently: Take 10 mg by mouth at bedtime.) 30 tablet 11   ferric citrate (AURYXIA) 1 GM 210 MG(Fe) tablet  Take 420 mg by mouth 3 (three) times daily with meals.     gabapentin (NEURONTIN) 400 MG capsule Take 1 capsule (400 mg total) by mouth at bedtime.     ipratropium (ATROVENT HFA) 17 MCG/ACT inhaler Inhale 2 puffs into the lungs every 4 (four) hours as needed for wheezing.      Iron Sucrose (VENOFER IV) Inject 50 mLs into the vein once a week. At dialysis     levothyroxine (SYNTHROID) 125 MCG tablet Take 125 mcg by mouth daily before breakfast.     Methoxy PEG-Epoetin Beta (MIRCERA IJ) Mircera     midodrine (PROAMATINE) 10 MG tablet Take 1 tablet (10 mg total) by mouth 2 (two) times daily with a meal. 60 tablet 0   multivitamin (RENA-VIT) TABS tablet Take 1 tablet by mouth at bedtime.     nitroGLYCERIN (NITROSTAT) 0.4 MG SL tablet Place 1 tablet (0.4 mg total) under the tongue every 5 (five) minutes x 3 doses as needed for chest pain. 25 tablet 2   NOVOLIN 70/30 KWIKPEN (70-30) 100 UNIT/ML KwikPen Inject 35 Units into the skin in the morning and at bedtime. (Patient taking differently: Inject 24 Units into the skin in the morning and at bedtime.) 15 mL 11   ondansetron (ZOFRAN) 8 MG tablet Take 8 mg by mouth every 8 (eight) hours as needed for nausea.     ONETOUCH ULTRA test strip Check blood glucose THREE TIMES DAILY     pantoprazole  (PROTONIX) 40 MG tablet Take 1 tablet (40 mg total) by mouth 2 (two) times daily. 60 tablet 1   ranolazine (RANEXA) 500 MG 12 hr tablet Take 1 tablet (500 mg total) by mouth at bedtime.     VITAMIN D PO Take 50 mcg by mouth See admin instructions. Tue, thur, sat at dialysis     Vitamin D, Ergocalciferol, (DRISDOL) 1.25 MG (50000 UNIT) CAPS capsule Take 50,000 Units by mouth every Sunday.      cyclobenzaprine (FLEXERIL) 5 MG tablet Take 1 tablet (5 mg total) by mouth 2 (two) times daily with a meal. (Patient not taking: Reported on 08/25/2021) 30 tablet 0   No facility-administered medications prior to visit.     Allergies:   Penicillins, Beta adrenergic blockers, and Brilinta [ticagrelor]   Social History   Socioeconomic History   Marital status: Divorced    Spouse name: Not on file   Number of children: Not on file   Years of education: Not on file   Highest education level: Not on file  Occupational History   Occupation: Disabled  Tobacco Use   Smoking status: Former    Packs/day: 1.00    Years: 20.00    Pack years: 20.00    Types: Cigarettes    Quit date: 12/10/2012    Years since quitting: 8.7   Smokeless tobacco: Never   Tobacco comments:    Not currently smoking   Vaping Use   Vaping Use: Never used  Substance and Sexual Activity   Alcohol use: No    Alcohol/week: 0.0 standard drinks   Drug use: No   Sexual activity: Not Currently  Other Topics Concern   Not on file  Social History Narrative   Lives with mother.   Social Determinants of Health   Financial Resource Strain: Not on file  Food Insecurity: Not on file  Transportation Needs: Not on file  Physical Activity: Not on file  Stress: Not on file  Social Connections: Not on file  Family History:  The patient's family history includes AAA (abdominal aortic aneurysm) in her father; Diabetes in her mother; Heart disease in her brother and mother; Hyperlipidemia in her mother and son; Hypertension in her  brother, father, mother, and son; Thyroid disease in her father.   Review of Systems:    Please see the history of present illness.     All other systems reviewed and are otherwise negative except as noted above.   Physical Exam:    VS:  BP 108/60    Pulse 93    Ht 5' 5.5" (1.664 m)    Wt 206 lb (93.4 kg)    SpO2 97%    BMI 33.76 kg/m    General: Well developed, well nourished,female appearing in no acute distress. Head: Normocephalic, atraumatic. Neck: No carotid bruits. JVD not elevated.  Lungs: Respirations regular and unlabored, without wheezes or rales.  Heart: Regular rate and rhythm. No S3 or S4.  No murmur, no rubs, or gallops appreciated. Abdomen: Appears non-distended. No obvious abdominal masses. Msk:  Strength and tone appear normal for age. No obvious joint deformities or effusions. Extremities: No clubbing or cyanosis. No pitting edema.  Distal pedal pulses are 2+ bilaterally. Neuro: Alert and oriented X 3. Moves all extremities spontaneously. No focal deficits noted. Psych:  Responds to questions appropriately with a normal affect. Skin: No rashes or lesions noted  Wt Readings from Last 3 Encounters:  09/01/21 206 lb (93.4 kg)  08/28/21 216 lb 0.8 oz (98 kg)  08/25/21 210 lb 1.6 oz (95.3 kg)     Studies/Labs Reviewed:   EKG:  EKG is not ordered today.  EKG from 08/28/2021 is reviewed and shows NSR, HR 78 with PVC's and ST depression along the lateral leads.   Recent Labs: 08/25/2021: B Natriuretic Peptide 1,086.0 08/26/2021: ALT 28 08/27/2021: Magnesium 2.1 08/28/2021: BUN 13; Creatinine, Ser 3.15; Hemoglobin 9.7; Platelets 156; Potassium 3.4; Sodium 133   Lipid Panel    Component Value Date/Time   CHOL 94 08/27/2021 0347   TRIG 144 08/27/2021 0347   HDL 44 08/27/2021 0347   CHOLHDL 2.1 08/27/2021 0347   VLDL 29 08/27/2021 0347   LDLCALC 21 08/27/2021 0347    Additional studies/ records that were reviewed today include:   Echocardiogram:  05/2021 IMPRESSIONS     1. Poor endocardial visualization even with contrast Appears to be some  inferior basal wall hypokineis on contrast images . Left ventricular  ejection fraction, by estimation, is 50 to 55%. The left ventricle has low  normal function. The left ventricle  demonstrates regional wall motion abnormalities (see scoring  diagram/findings for description). There is moderate left ventricular  hypertrophy. Left ventricular diastolic parameters are consistent with  Grade II diastolic dysfunction  (pseudonormalization). Elevated left ventricular end-diastolic pressure.   2. Right ventricular systolic function is normal. The right ventricular  size is normal.   3. Left atrial size was mildly dilated.   4. The mitral valve is abnormal. Trivial mitral valve regurgitation. No  evidence of mitral stenosis.   5. The aortic valve is tricuspid. Aortic valve regurgitation is not  visualized. Mild to moderate aortic valve sclerosis/calcification is  present, without any evidence of aortic stenosis.   6. The inferior vena cava is normal in size with greater than 50%  respiratory variability, suggesting right atrial pressure of 3 mmHg.   LHC: 08/2021 Ost LAD to Prox LAD lesion is 100% stenosed.  Prox LAD to Mid LAD lesion is 100% stenosed.  Prox RCA lesion is 100% stenosed.  LPAV lesion is 80% stenosed.   1st Mrg-1 lesion is 99% stenosed.  1st Mrg-2 lesion is 75% stenosed.   -----------------------   [name] to Dist LM lesion is 95% stenosed.  Ost Cx to Mid Cx lesion is 99% stenosed.   Balloon angioplasty was performed using a BALLN SAPPHIRE [place] 3.0X12.   Post intervention, there is a 40% residual stenosis in the ostial to mid LCx, and 50% residual stenosis in the ostial LM-distal LM.Marland Kitchen   LIMA-LAD graft was visualized by angiography and is normal in caliber.  Origin to Prox Graft stent is 35% stenosed.   SVG-rPDA:  Ostial stent is 20% stenosed.  Prox Graft-1 proximal stent is 15%  stenosed.  Mid Graft stent is 15% stenosed.  Otherwise mild diffuse disease in the both the stented and nondistended portions of the graft.   SVG-OM graft was not visualized due to known occlusion.  Origin to Prox Graft lesion before 1st Mrg  is 100% stenosed.   ---------- HEMODYNAMICS ----------   There is moderate left ventricular systolic dysfunction.   The left ventricular ejection fraction is 35-45% by visual estimate.   There is no aortic valve stenosis.   SUMMARY Known severe native vessel disease: Known RCA CTO (not imaged), known LAD CTO.  Also known occluded SVG-OM LM-LCx stent 90 to 95% ISR (likely culprit lesion) with 90%/stable ostial small caliber OM branch.  LCx is a large likely codominant vessel.. (Moderately successful) [place] balloon PTCA of LM-LCx ISR using 3.0 mm [place] balloon further attempted balloon inflations were aborted due to patient having significant anginal pain and blood pressure drops with inflations) -> stenosis reduced to 50%.  TIMI-3 flow 35% ISR of ostial LIMA graft Mild 20% ISR throughout ostial-proximal SVG-PDA graft 10 to 15 mmHg gradient through ostial L-Subclavian Artery Stent Moderate LVEDP with moderate reduced EF of 40 to 45% global HK.  (Recommend echocardiogram to better assess)     Recommendations: Aggressive risk factor modification, if symptoms worsen, would then need to become more aggressive with the Left Main stent post dilation.   Assessment:    1. Coronary artery disease of bypass graft of native heart with stable angina pectoris ([place])   2. Anemia due to chronic kidney disease, on chronic dialysis ([place])   3. Hyperlipidemia LDL goal <70   4. Diabetes mellitus type 2, insulin dependent ([place])   5. ESRD on hemodialysis ([place])      Plan:   In order of problems listed above:  1. CAD - She has complex CAD with prior CABG and multiple stents and interventions along her native arteries and vein grafts. Recently admitted for a recurrent NSTEMI  and her cath showed 90 to 95% ISR of the LM/LCx stent and was treated with moderately successful balloon angioplasty reducing the stenosis to 50%.  - Still having some chest pain but significantly improved from her recent admission. I encouraged her to make [place] aware if her pain progresses in frequency or severity as she may require a repeat catheterization as mentioned during her last procedure notes.  - Continue ASA, Plavix, Atorvastatin, Zetia and Ranexa 500mg  daily. She has been intolerant to BB therapy in the past and BP has not allowed for the use of Imdur or low-dose Amlodipine.  2. Anemia - Hgb was at 9.7 on 08/28/2021. No reports of active bleeding. We did request labs from her HD center today and Hgb had improved to 10.2 on 08/30/2021 which is close  to her baseline.    3. HLD - FLP in 08/2021 showed her LDL was down to 21. She remains on Atorvastatin 80mg  daily and Zetia 10mg  daily.   4. Type 2 DM - Followed by her PCP. Hgb A1c was stable at 7.1 when checked earlier this month.   5. ESRD - On HD - Tuesday, Thursday and Saturday schedule.    Medication Adjustments/Labs and Tests Ordered: Current medicines are reviewed at length with the patient today.  Concerns regarding medicines are outlined above.  Medication changes, Labs and Tests ordered today are listed in the Patient Instructions below. Patient Instructions  Medication Instructions:  Your physician recommends that you continue on your current medications as directed. Please refer to the Current Medication list given to you today.  *If you need a refill on your cardiac medications before your next appointment, please call your pharmacy*   Lab Work: NONE   If you have labs (blood work) drawn today and your tests are completely normal, you will receive your results only by: [place] (if you have MyChart) OR A paper copy in the mail If you have any lab test that is abnormal or we need to change your treatment, we  will call you to review the results.   Testing/Procedures: NONE    Follow-Up: At [facility], you and your health needs are our priority.  As part of our continuing mission to provide you with exceptional heart care, we have created designated Provider Care Teams.  These Care Teams include your primary Cardiologist (physician) and Advanced Practice Providers (APPs -  Physician Assistants and Nurse Practitioners) who all work together to provide you with the care you need, when you need it.  We recommend signing up for the patient portal called "MyChart".  Sign up information is provided on this After Visit Summary.  MyChart is used to connect with patients for Virtual Visits (Telemedicine).  Patients are able to view lab/test results, encounter notes, upcoming appointments, etc.  Non-urgent messages can be sent to your provider as well.   To learn more about what you can do with MyChart, go to [email or web address].    Your next appointment:   1 month(s)  The format for your next appointment:   In Person  Provider:   You may see Rozann Lesches, MD or one of the following Advanced Practice Providers on your designated Care Team:   Bernerd Pho, PA-C  Ermalinda Barrios, PA-C     Other Instructions Thank you for choosing [facility]!      Signed, Erma Heritage, PA-C  09/01/2021 9:39 PM    [facility] [address], [place] [address] Phone: [phone] Fax: [phone]

## 2021-09-01 ENCOUNTER — Encounter: Payer: Self-pay | Admitting: Student

## 2021-09-01 ENCOUNTER — Ambulatory Visit (INDEPENDENT_AMBULATORY_CARE_PROVIDER_SITE_OTHER): Payer: Medicare HMO | Admitting: Student

## 2021-09-01 ENCOUNTER — Other Ambulatory Visit: Payer: Self-pay

## 2021-09-01 VITALS — BP 108/60 | HR 93 | Ht 65.5 in | Wt 206.0 lb

## 2021-09-01 DIAGNOSIS — I25708 Atherosclerosis of coronary artery bypass graft(s), unspecified, with other forms of angina pectoris: Secondary | ICD-10-CM

## 2021-09-01 DIAGNOSIS — Z992 Dependence on renal dialysis: Secondary | ICD-10-CM

## 2021-09-01 DIAGNOSIS — D631 Anemia in chronic kidney disease: Secondary | ICD-10-CM | POA: Diagnosis not present

## 2021-09-01 DIAGNOSIS — N186 End stage renal disease: Secondary | ICD-10-CM

## 2021-09-01 DIAGNOSIS — E785 Hyperlipidemia, unspecified: Secondary | ICD-10-CM | POA: Diagnosis not present

## 2021-09-01 DIAGNOSIS — N2581 Secondary hyperparathyroidism of renal origin: Secondary | ICD-10-CM | POA: Diagnosis not present

## 2021-09-01 DIAGNOSIS — D689 Coagulation defect, unspecified: Secondary | ICD-10-CM | POA: Diagnosis not present

## 2021-09-01 DIAGNOSIS — Z794 Long term (current) use of insulin: Secondary | ICD-10-CM | POA: Diagnosis not present

## 2021-09-01 DIAGNOSIS — E119 Type 2 diabetes mellitus without complications: Secondary | ICD-10-CM | POA: Diagnosis not present

## 2021-09-01 DIAGNOSIS — D509 Iron deficiency anemia, unspecified: Secondary | ICD-10-CM | POA: Diagnosis not present

## 2021-09-01 NOTE — Patient Instructions (Signed)
Medication Instructions:  Your physician recommends that you continue on your current medications as directed. Please refer to the Current Medication list given to you today.  *If you need a refill on your cardiac medications before your next appointment, please call your pharmacy*   Lab Work: NONE   If you have labs (blood work) drawn today and your tests are completely normal, you will receive your results only by: [place] (if you have MyChart) OR A paper copy in the mail If you have any lab test that is abnormal or we need to change your treatment, we will call you to review the results.   Testing/Procedures: NONE    Follow-Up: At [facility], you and your health needs are our priority.  As part of our continuing mission to provide you with exceptional heart care, we have created designated Provider Care Teams.  These Care Teams include your primary Cardiologist (physician) and Advanced Practice Providers (APPs -  Physician Assistants and Nurse Practitioners) who all work together to provide you with the care you need, when you need it.  We recommend signing up for the patient portal called "MyChart".  Sign up information is provided on this After Visit Summary.  MyChart is used to connect with patients for Virtual Visits (Telemedicine).  Patients are able to view lab/test results, encounter notes, upcoming appointments, etc.  Non-urgent messages can be sent to your provider as well.   To learn more about what you can do with MyChart, go to [email or web address].    Your next appointment:   1 month(s)  The format for your next appointment:   In Person  Provider:   You may see Rozann Lesches, MD or one of the following Advanced Practice Providers on your designated Care Team:   Bernerd Pho, PA-C  Ermalinda Barrios, PA-C     Other Instructions Thank you for choosing [facility]!

## 2021-09-03 DIAGNOSIS — N2581 Secondary hyperparathyroidism of renal origin: Secondary | ICD-10-CM | POA: Diagnosis not present

## 2021-09-03 DIAGNOSIS — Z992 Dependence on renal dialysis: Secondary | ICD-10-CM | POA: Diagnosis not present

## 2021-09-03 DIAGNOSIS — N186 End stage renal disease: Secondary | ICD-10-CM | POA: Diagnosis not present

## 2021-09-03 DIAGNOSIS — D509 Iron deficiency anemia, unspecified: Secondary | ICD-10-CM | POA: Diagnosis not present

## 2021-09-03 DIAGNOSIS — D689 Coagulation defect, unspecified: Secondary | ICD-10-CM | POA: Diagnosis not present

## 2021-09-05 DIAGNOSIS — Z992 Dependence on renal dialysis: Secondary | ICD-10-CM | POA: Diagnosis not present

## 2021-09-05 DIAGNOSIS — Z955 Presence of coronary angioplasty implant and graft: Secondary | ICD-10-CM | POA: Diagnosis not present

## 2021-09-05 DIAGNOSIS — J449 Chronic obstructive pulmonary disease, unspecified: Secondary | ICD-10-CM | POA: Diagnosis not present

## 2021-09-05 DIAGNOSIS — I739 Peripheral vascular disease, unspecified: Secondary | ICD-10-CM | POA: Diagnosis not present

## 2021-09-05 DIAGNOSIS — I209 Angina pectoris, unspecified: Secondary | ICD-10-CM | POA: Diagnosis not present

## 2021-09-05 DIAGNOSIS — Z8679 Personal history of other diseases of the circulatory system: Secondary | ICD-10-CM | POA: Diagnosis not present

## 2021-09-05 DIAGNOSIS — Z951 Presence of aortocoronary bypass graft: Secondary | ICD-10-CM | POA: Diagnosis not present

## 2021-09-05 DIAGNOSIS — I10 Essential (primary) hypertension: Secondary | ICD-10-CM | POA: Diagnosis not present

## 2021-09-06 DIAGNOSIS — Z992 Dependence on renal dialysis: Secondary | ICD-10-CM | POA: Diagnosis not present

## 2021-09-06 DIAGNOSIS — N2581 Secondary hyperparathyroidism of renal origin: Secondary | ICD-10-CM | POA: Diagnosis not present

## 2021-09-06 DIAGNOSIS — N186 End stage renal disease: Secondary | ICD-10-CM | POA: Diagnosis not present

## 2021-09-06 DIAGNOSIS — D509 Iron deficiency anemia, unspecified: Secondary | ICD-10-CM | POA: Diagnosis not present

## 2021-09-06 DIAGNOSIS — D689 Coagulation defect, unspecified: Secondary | ICD-10-CM | POA: Diagnosis not present

## 2021-09-08 ENCOUNTER — Emergency Department (HOSPITAL_COMMUNITY): Payer: Medicare HMO

## 2021-09-08 ENCOUNTER — Inpatient Hospital Stay (HOSPITAL_COMMUNITY)
Admission: EM | Admit: 2021-09-08 | Discharge: 2021-09-21 | DRG: 250 | Disposition: A | Discharge status: Hospice | Payer: Medicare HMO | Attending: Internal Medicine | Admitting: Internal Medicine

## 2021-09-08 ENCOUNTER — Other Ambulatory Visit: Payer: Self-pay

## 2021-09-08 ENCOUNTER — Encounter (HOSPITAL_COMMUNITY)
Admission: EM | Disposition: A | Discharge status: Hospice | Payer: Self-pay | Source: Home / Self Care | Attending: Internal Medicine

## 2021-09-08 DIAGNOSIS — I70201 Unspecified atherosclerosis of native arteries of extremities, right leg: Secondary | ICD-10-CM | POA: Diagnosis not present

## 2021-09-08 DIAGNOSIS — F419 Anxiety disorder, unspecified: Secondary | ICD-10-CM | POA: Diagnosis present

## 2021-09-08 DIAGNOSIS — R079 Chest pain, unspecified: Secondary | ICD-10-CM | POA: Diagnosis not present

## 2021-09-08 DIAGNOSIS — E782 Mixed hyperlipidemia: Secondary | ICD-10-CM | POA: Diagnosis not present

## 2021-09-08 DIAGNOSIS — Z7982 Long term (current) use of aspirin: Secondary | ICD-10-CM

## 2021-09-08 DIAGNOSIS — I10 Essential (primary) hypertension: Secondary | ICD-10-CM | POA: Diagnosis not present

## 2021-09-08 DIAGNOSIS — Z515 Encounter for palliative care: Secondary | ICD-10-CM | POA: Diagnosis not present

## 2021-09-08 DIAGNOSIS — I5041 Acute combined systolic (congestive) and diastolic (congestive) heart failure: Secondary | ICD-10-CM | POA: Diagnosis not present

## 2021-09-08 DIAGNOSIS — E1122 Type 2 diabetes mellitus with diabetic chronic kidney disease: Secondary | ICD-10-CM | POA: Diagnosis present

## 2021-09-08 DIAGNOSIS — E039 Hypothyroidism, unspecified: Secondary | ICD-10-CM | POA: Diagnosis present

## 2021-09-08 DIAGNOSIS — I428 Other cardiomyopathies: Secondary | ICD-10-CM | POA: Diagnosis not present

## 2021-09-08 DIAGNOSIS — Z9049 Acquired absence of other specified parts of digestive tract: Secondary | ICD-10-CM

## 2021-09-08 DIAGNOSIS — Z9861 Coronary angioplasty status: Secondary | ICD-10-CM | POA: Diagnosis not present

## 2021-09-08 DIAGNOSIS — I200 Unstable angina: Secondary | ICD-10-CM | POA: Diagnosis not present

## 2021-09-08 DIAGNOSIS — Z794 Long term (current) use of insulin: Secondary | ICD-10-CM | POA: Diagnosis not present

## 2021-09-08 DIAGNOSIS — Z8616 Personal history of COVID-19: Secondary | ICD-10-CM | POA: Diagnosis not present

## 2021-09-08 DIAGNOSIS — E669 Obesity, unspecified: Secondary | ICD-10-CM | POA: Diagnosis present

## 2021-09-08 DIAGNOSIS — K761 Chronic passive congestion of liver: Secondary | ICD-10-CM | POA: Diagnosis present

## 2021-09-08 DIAGNOSIS — K2980 Duodenitis without bleeding: Secondary | ICD-10-CM | POA: Diagnosis not present

## 2021-09-08 DIAGNOSIS — Z955 Presence of coronary angioplasty implant and graft: Secondary | ICD-10-CM

## 2021-09-08 DIAGNOSIS — K529 Noninfective gastroenteritis and colitis, unspecified: Secondary | ICD-10-CM | POA: Diagnosis not present

## 2021-09-08 DIAGNOSIS — I25110 Atherosclerotic heart disease of native coronary artery with unstable angina pectoris: Secondary | ICD-10-CM | POA: Diagnosis not present

## 2021-09-08 DIAGNOSIS — Z20822 Contact with and (suspected) exposure to covid-19: Secondary | ICD-10-CM | POA: Diagnosis not present

## 2021-09-08 DIAGNOSIS — R1310 Dysphagia, unspecified: Secondary | ICD-10-CM | POA: Diagnosis not present

## 2021-09-08 DIAGNOSIS — Z09 Encounter for follow-up examination after completed treatment for conditions other than malignant neoplasm: Secondary | ICD-10-CM

## 2021-09-08 DIAGNOSIS — Z83438 Family history of other disorder of lipoprotein metabolism and other lipidemia: Secondary | ICD-10-CM

## 2021-09-08 DIAGNOSIS — E785 Hyperlipidemia, unspecified: Secondary | ICD-10-CM | POA: Diagnosis not present

## 2021-09-08 DIAGNOSIS — Z8249 Family history of ischemic heart disease and other diseases of the circulatory system: Secondary | ICD-10-CM

## 2021-09-08 DIAGNOSIS — E1151 Type 2 diabetes mellitus with diabetic peripheral angiopathy without gangrene: Secondary | ICD-10-CM | POA: Diagnosis present

## 2021-09-08 DIAGNOSIS — Z79890 Hormone replacement therapy (postmenopausal): Secondary | ICD-10-CM

## 2021-09-08 DIAGNOSIS — Z7902 Long term (current) use of antithrombotics/antiplatelets: Secondary | ICD-10-CM

## 2021-09-08 DIAGNOSIS — Z7189 Other specified counseling: Secondary | ICD-10-CM | POA: Diagnosis not present

## 2021-09-08 DIAGNOSIS — Z992 Dependence on renal dialysis: Secondary | ICD-10-CM | POA: Diagnosis not present

## 2021-09-08 DIAGNOSIS — I493 Ventricular premature depolarization: Secondary | ICD-10-CM | POA: Diagnosis present

## 2021-09-08 DIAGNOSIS — I959 Hypotension, unspecified: Secondary | ICD-10-CM

## 2021-09-08 DIAGNOSIS — E1169 Type 2 diabetes mellitus with other specified complication: Secondary | ICD-10-CM | POA: Diagnosis not present

## 2021-09-08 DIAGNOSIS — N2581 Secondary hyperparathyroidism of renal origin: Secondary | ICD-10-CM | POA: Diagnosis present

## 2021-09-08 DIAGNOSIS — N186 End stage renal disease: Secondary | ICD-10-CM | POA: Diagnosis not present

## 2021-09-08 DIAGNOSIS — K2970 Gastritis, unspecified, without bleeding: Secondary | ICD-10-CM | POA: Diagnosis not present

## 2021-09-08 DIAGNOSIS — I25700 Atherosclerosis of coronary artery bypass graft(s), unspecified, with unstable angina pectoris: Secondary | ICD-10-CM | POA: Diagnosis present

## 2021-09-08 DIAGNOSIS — I9589 Other hypotension: Secondary | ICD-10-CM | POA: Diagnosis not present

## 2021-09-08 DIAGNOSIS — E1165 Type 2 diabetes mellitus with hyperglycemia: Secondary | ICD-10-CM | POA: Diagnosis not present

## 2021-09-08 DIAGNOSIS — Z66 Do not resuscitate: Secondary | ICD-10-CM | POA: Diagnosis not present

## 2021-09-08 DIAGNOSIS — M109 Gout, unspecified: Secondary | ICD-10-CM | POA: Diagnosis present

## 2021-09-08 DIAGNOSIS — Z833 Family history of diabetes mellitus: Secondary | ICD-10-CM

## 2021-09-08 DIAGNOSIS — I5022 Chronic systolic (congestive) heart failure: Secondary | ICD-10-CM | POA: Diagnosis present

## 2021-09-08 DIAGNOSIS — E11649 Type 2 diabetes mellitus with hypoglycemia without coma: Secondary | ICD-10-CM | POA: Diagnosis not present

## 2021-09-08 DIAGNOSIS — E871 Hypo-osmolality and hyponatremia: Secondary | ICD-10-CM | POA: Diagnosis not present

## 2021-09-08 DIAGNOSIS — N261 Atrophy of kidney (terminal): Secondary | ICD-10-CM | POA: Diagnosis not present

## 2021-09-08 DIAGNOSIS — R1084 Generalized abdominal pain: Secondary | ICD-10-CM | POA: Diagnosis not present

## 2021-09-08 DIAGNOSIS — I132 Hypertensive heart and chronic kidney disease with heart failure and with stage 5 chronic kidney disease, or end stage renal disease: Secondary | ICD-10-CM | POA: Diagnosis present

## 2021-09-08 DIAGNOSIS — Z87891 Personal history of nicotine dependence: Secondary | ICD-10-CM

## 2021-09-08 DIAGNOSIS — J449 Chronic obstructive pulmonary disease, unspecified: Secondary | ICD-10-CM | POA: Diagnosis present

## 2021-09-08 DIAGNOSIS — M898X9 Other specified disorders of bone, unspecified site: Secondary | ICD-10-CM | POA: Diagnosis present

## 2021-09-08 DIAGNOSIS — D631 Anemia in chronic kidney disease: Secondary | ICD-10-CM | POA: Diagnosis present

## 2021-09-08 DIAGNOSIS — R570 Cardiogenic shock: Secondary | ICD-10-CM | POA: Diagnosis not present

## 2021-09-08 DIAGNOSIS — R579 Shock, unspecified: Secondary | ICD-10-CM | POA: Diagnosis not present

## 2021-09-08 DIAGNOSIS — I214 Non-ST elevation (NSTEMI) myocardial infarction: Secondary | ICD-10-CM | POA: Diagnosis not present

## 2021-09-08 DIAGNOSIS — Z79899 Other long term (current) drug therapy: Secondary | ICD-10-CM

## 2021-09-08 DIAGNOSIS — E8809 Other disorders of plasma-protein metabolism, not elsewhere classified: Secondary | ICD-10-CM | POA: Diagnosis not present

## 2021-09-08 DIAGNOSIS — I509 Heart failure, unspecified: Secondary | ICD-10-CM | POA: Diagnosis not present

## 2021-09-08 DIAGNOSIS — E875 Hyperkalemia: Secondary | ICD-10-CM | POA: Diagnosis not present

## 2021-09-08 DIAGNOSIS — Z951 Presence of aortocoronary bypass graft: Secondary | ICD-10-CM

## 2021-09-08 DIAGNOSIS — E119 Type 2 diabetes mellitus without complications: Secondary | ICD-10-CM | POA: Diagnosis not present

## 2021-09-08 DIAGNOSIS — K219 Gastro-esophageal reflux disease without esophagitis: Secondary | ICD-10-CM | POA: Diagnosis present

## 2021-09-08 DIAGNOSIS — I340 Nonrheumatic mitral (valve) insufficiency: Secondary | ICD-10-CM | POA: Diagnosis not present

## 2021-09-08 DIAGNOSIS — R0902 Hypoxemia: Secondary | ICD-10-CM | POA: Diagnosis not present

## 2021-09-08 DIAGNOSIS — I517 Cardiomegaly: Secondary | ICD-10-CM | POA: Diagnosis not present

## 2021-09-08 DIAGNOSIS — L8990 Pressure ulcer of unspecified site, unspecified stage: Secondary | ICD-10-CM | POA: Insufficient documentation

## 2021-09-08 DIAGNOSIS — I255 Ischemic cardiomyopathy: Secondary | ICD-10-CM | POA: Diagnosis not present

## 2021-09-08 DIAGNOSIS — Z8349 Family history of other endocrine, nutritional and metabolic diseases: Secondary | ICD-10-CM

## 2021-09-08 DIAGNOSIS — M7989 Other specified soft tissue disorders: Secondary | ICD-10-CM | POA: Diagnosis present

## 2021-09-08 DIAGNOSIS — Z888 Allergy status to other drugs, medicaments and biological substances status: Secondary | ICD-10-CM

## 2021-09-08 DIAGNOSIS — L299 Pruritus, unspecified: Secondary | ICD-10-CM | POA: Diagnosis not present

## 2021-09-08 DIAGNOSIS — Z880 Allergy status to penicillin: Secondary | ICD-10-CM

## 2021-09-08 DIAGNOSIS — Z6836 Body mass index (BMI) 36.0-36.9, adult: Secondary | ICD-10-CM

## 2021-09-08 DIAGNOSIS — R112 Nausea with vomiting, unspecified: Secondary | ICD-10-CM | POA: Diagnosis not present

## 2021-09-08 DIAGNOSIS — I252 Old myocardial infarction: Secondary | ICD-10-CM

## 2021-09-08 DIAGNOSIS — J811 Chronic pulmonary edema: Secondary | ICD-10-CM | POA: Diagnosis not present

## 2021-09-08 DIAGNOSIS — I2510 Atherosclerotic heart disease of native coronary artery without angina pectoris: Secondary | ICD-10-CM | POA: Diagnosis present

## 2021-09-08 HISTORY — PX: LEFT HEART CATH AND CORS/GRAFTS ANGIOGRAPHY: CATH118250

## 2021-09-08 LAB — CBC
HCT: 34 % — ABNORMAL LOW (ref 36.0–46.0)
Hemoglobin: 10.7 g/dL — ABNORMAL LOW (ref 12.0–15.0)
MCH: 34.4 pg — ABNORMAL HIGH (ref 26.0–34.0)
MCHC: 31.5 g/dL (ref 30.0–36.0)
MCV: 109.3 fL — ABNORMAL HIGH (ref 80.0–100.0)
Platelets: 220 10*3/uL (ref 150–400)
RBC: 3.11 MIL/uL — ABNORMAL LOW (ref 3.87–5.11)
RDW: 14.6 % (ref 11.5–15.5)
WBC: 14.1 10*3/uL — ABNORMAL HIGH (ref 4.0–10.5)
nRBC: 0 % (ref 0.0–0.2)

## 2021-09-08 LAB — BASIC METABOLIC PANEL
Anion gap: 18 — ABNORMAL HIGH (ref 5–15)
BUN: 40 mg/dL — ABNORMAL HIGH (ref 8–23)
CO2: 19 mmol/L — ABNORMAL LOW (ref 22–32)
Calcium: 9.7 mg/dL (ref 8.9–10.3)
Chloride: 97 mmol/L — ABNORMAL LOW (ref 98–111)
Creatinine, Ser: 6.29 mg/dL — ABNORMAL HIGH (ref 0.44–1.00)
GFR, Estimated: 7 mL/min — ABNORMAL LOW (ref >60)
Glucose, Bld: 246 mg/dL — ABNORMAL HIGH (ref 70–99)
Potassium: 4.2 mmol/L (ref 3.5–5.1)
Sodium: 134 mmol/L — ABNORMAL LOW (ref 135–145)

## 2021-09-08 LAB — RESP PANEL BY RT-PCR (FLU A&B, COVID) ARPGX2
Influenza A by PCR: NEGATIVE
Influenza B by PCR: NEGATIVE
SARS Coronavirus 2 by RT PCR: NEGATIVE

## 2021-09-08 LAB — GLUCOSE, CAPILLARY
Glucose-Capillary: 162 mg/dL — ABNORMAL HIGH (ref 70–99)
Glucose-Capillary: 193 mg/dL — ABNORMAL HIGH (ref 70–99)
Glucose-Capillary: 174 mg/dL — ABNORMAL HIGH (ref 70–99)

## 2021-09-08 LAB — TROPONIN I (HIGH SENSITIVITY)
Troponin I (High Sensitivity): 134 ng/L (ref <18)
Troponin I (High Sensitivity): 173 ng/L (ref <18)

## 2021-09-08 SURGERY — LEFT HEART CATH AND CORS/GRAFTS ANGIOGRAPHY
Anesthesia: LOCAL

## 2021-09-08 MED ORDER — ALTEPLASE 2 MG IJ SOLR
2.0000 mg | Freq: Once | INTRAMUSCULAR | Status: DC | PRN
  Filled 2021-09-08: qty 2

## 2021-09-08 MED ORDER — LEVOTHYROXINE SODIUM 25 MCG PO TABS
125.0000 ug | ORAL_TABLET | Freq: Every day | ORAL | Status: DC
  Administered 2021-09-09 – 2021-09-12 (×4): 125 ug via ORAL
  Filled 2021-09-08 (×4): qty 1

## 2021-09-08 MED ORDER — ALPRAZOLAM 0.25 MG PO TABS: 0.2500 mg | ORAL_TABLET | ORAL | Status: DC

## 2021-09-08 MED ORDER — NITROGLYCERIN 0.4 MG SL SUBL
0.4000 mg | SUBLINGUAL_TABLET | SUBLINGUAL | Status: DC | PRN
  Administered 2021-09-08 (×4): 0.4 mg via SUBLINGUAL
  Filled 2021-09-08 (×3): qty 1

## 2021-09-08 MED ORDER — SODIUM CHLORIDE 0.9% FLUSH: 3.0000 mL | INTRAVENOUS | Status: DC | PRN

## 2021-09-08 MED ORDER — HEPARIN SODIUM (PORCINE) 1000 UNIT/ML IJ SOLN
INTRAMUSCULAR | Status: AC
  Filled 2021-09-08: qty 1

## 2021-09-08 MED ORDER — EZETIMIBE 10 MG PO TABS
10.0000 mg | ORAL_TABLET | Freq: Every day | ORAL | Status: DC
  Administered 2021-09-09 – 2021-09-15 (×5): 10 mg via ORAL
  Filled 2021-09-08 (×5): qty 1

## 2021-09-08 MED ORDER — PANTOPRAZOLE SODIUM 40 MG PO TBEC
40.0000 mg | DELAYED_RELEASE_TABLET | Freq: Two times a day (BID) | ORAL | Status: DC
  Administered 2021-09-09 – 2021-09-11 (×6): 40 mg via ORAL
  Filled 2021-09-08 (×7): qty 1

## 2021-09-08 MED ORDER — ALBUMIN HUMAN 25 % IV SOLN
INTRAVENOUS | Status: AC
  Administered 2021-09-08: 25 g via INTRAVENOUS
  Filled 2021-09-08: qty 100

## 2021-09-08 MED ORDER — LIDOCAINE HCL (PF) 1 % IJ SOLN
INTRAMUSCULAR | Status: AC
  Filled 2021-09-08: qty 30

## 2021-09-08 MED ORDER — NITROGLYCERIN 0.4 MG SL SUBL
0.4000 mg | SUBLINGUAL_TABLET | SUBLINGUAL | Status: DC | PRN
  Administered 2021-09-08 (×3): 0.4 mg via SUBLINGUAL
  Filled 2021-09-08 (×2): qty 1

## 2021-09-08 MED ORDER — HEPARIN (PORCINE) IN NACL 1000-0.9 UT/500ML-% IV SOLN
INTRAVENOUS | Status: AC
  Filled 2021-09-08: qty 1000

## 2021-09-08 MED ORDER — INSULIN ASPART PROT & ASPART (70-30 MIX) 100 UNIT/ML ~~LOC~~ SUSP
24.0000 [IU] | Freq: Two times a day (BID) | SUBCUTANEOUS | Status: DC
  Administered 2021-09-08 – 2021-09-11 (×4): 24 [IU] via SUBCUTANEOUS
  Filled 2021-09-08 (×2): qty 10

## 2021-09-08 MED ORDER — FLUTICASONE FUROATE-VILANTEROL 100-25 MCG/ACT IN AEPB
1.0000 | INHALATION_SPRAY | Freq: Every day | RESPIRATORY_TRACT | Status: DC
  Administered 2021-09-09 – 2021-09-21 (×9): 1 via RESPIRATORY_TRACT
  Filled 2021-09-08 (×3): qty 28

## 2021-09-08 MED ORDER — HEPARIN BOLUS VIA INFUSION
4000.0000 [IU] | Freq: Once | INTRAVENOUS | Status: AC
Start: 2021-09-08 — End: 2021-09-08
  Administered 2021-09-08: 11:00:00 4000 [IU] via INTRAVENOUS

## 2021-09-08 MED ORDER — CHLORHEXIDINE GLUCONATE CLOTH 2 % EX PADS
6.0000 | MEDICATED_PAD | Freq: Every day | CUTANEOUS | Status: DC
  Administered 2021-09-10: 15:00:00 6 via TOPICAL

## 2021-09-08 MED ORDER — INSULIN ISOPHANE & REGULAR (HUMAN 70-30)100 UNIT/ML KWIKPEN: 24.0000 [IU] | PEN_INJECTOR | Freq: Two times a day (BID) | SUBCUTANEOUS | Status: DC

## 2021-09-08 MED ORDER — LIDOCAINE-PRILOCAINE 2.5-2.5 % EX CREA: 1.0000 "application " | TOPICAL_CREAM | CUTANEOUS | Status: DC | PRN

## 2021-09-08 MED ORDER — ALPRAZOLAM 0.25 MG PO TABS
0.2500 mg | ORAL_TABLET | Freq: Every morning | ORAL | Status: DC
  Administered 2021-09-10 – 2021-09-11 (×2): 0.25 mg via ORAL
  Filled 2021-09-08 (×2): qty 1

## 2021-09-08 MED ORDER — LIDOCAINE HCL (PF) 1 % IJ SOLN: 5.0000 mL | INTRAMUSCULAR | Status: DC | PRN

## 2021-09-08 MED ORDER — FENTANYL CITRATE (PF) 100 MCG/2ML IJ SOLN
INTRAMUSCULAR | Status: AC
  Filled 2021-09-08: qty 2

## 2021-09-08 MED ORDER — ALBUTEROL SULFATE (2.5 MG/3ML) 0.083% IN NEBU: 2.5000 mg | INHALATION_SOLUTION | RESPIRATORY_TRACT | Status: DC | PRN

## 2021-09-08 MED ORDER — SODIUM CHLORIDE 0.9 % IV SOLN: 100.0000 mL | INTRAVENOUS | Status: DC | PRN

## 2021-09-08 MED ORDER — ALPRAZOLAM 0.5 MG PO TABS
0.5000 mg | ORAL_TABLET | Freq: Every day | ORAL | Status: DC
  Administered 2021-09-09 (×2): 0.5 mg via ORAL
  Filled 2021-09-08 (×2): qty 1

## 2021-09-08 MED ORDER — FERRIC CITRATE 1 GM 210 MG(FE) PO TABS
420.0000 mg | ORAL_TABLET | Freq: Three times a day (TID) | ORAL | Status: DC
  Administered 2021-09-08 – 2021-09-15 (×9): 420 mg via ORAL
  Filled 2021-09-08 (×25): qty 2

## 2021-09-08 MED ORDER — PENTAFLUOROPROP-TETRAFLUOROETH EX AERO: 1.0000 "application " | INHALATION_SPRAY | CUTANEOUS | Status: DC | PRN

## 2021-09-08 MED ORDER — ONDANSETRON HCL 4 MG PO TABS
8.0000 mg | ORAL_TABLET | Freq: Three times a day (TID) | ORAL | Status: DC | PRN
  Administered 2021-09-10: 02:00:00 8 mg via ORAL
  Filled 2021-09-08: qty 2

## 2021-09-08 MED ORDER — ASPIRIN EC 81 MG PO TBEC
81.0000 mg | DELAYED_RELEASE_TABLET | Freq: Every day | ORAL | Status: DC
  Administered 2021-09-10 – 2021-09-13 (×3): 81 mg via ORAL
  Filled 2021-09-08 (×4): qty 1

## 2021-09-08 MED ORDER — ATORVASTATIN CALCIUM 80 MG PO TABS
80.0000 mg | ORAL_TABLET | Freq: Every day | ORAL | Status: DC
  Administered 2021-09-09 – 2021-09-11 (×4): 80 mg via ORAL
  Filled 2021-09-08 (×4): qty 1

## 2021-09-08 MED ORDER — MIDODRINE HCL 5 MG PO TABS
10.0000 mg | ORAL_TABLET | Freq: Two times a day (BID) | ORAL | Status: DC
  Administered 2021-09-08 – 2021-09-12 (×7): 10 mg via ORAL
  Filled 2021-09-08 (×7): qty 2

## 2021-09-08 MED ORDER — HEPARIN (PORCINE) IN NACL 1000-0.9 UT/500ML-% IV SOLN
INTRAVENOUS | Status: DC | PRN
  Administered 2021-09-08 (×2): 500 mL

## 2021-09-08 MED ORDER — ACETAMINOPHEN 325 MG PO TABS: 650.0000 mg | ORAL_TABLET | ORAL | Status: DC | PRN

## 2021-09-08 MED ORDER — MIDAZOLAM HCL 2 MG/2ML IJ SOLN
INTRAMUSCULAR | Status: DC | PRN
  Administered 2021-09-08: 2 mg via INTRAVENOUS

## 2021-09-08 MED ORDER — IPRATROPIUM BROMIDE HFA 17 MCG/ACT IN AERS: 2.0000 | INHALATION_SPRAY | RESPIRATORY_TRACT | Status: DC | PRN

## 2021-09-08 MED ORDER — RANOLAZINE ER 500 MG PO TB12
500.0000 mg | ORAL_TABLET | Freq: Every day | ORAL | Status: DC
  Administered 2021-09-09 – 2021-09-11 (×4): 500 mg via ORAL
  Filled 2021-09-08 (×4): qty 1

## 2021-09-08 MED ORDER — SODIUM CHLORIDE 0.9 % IV SOLN: 250.0000 mL | INTRAVENOUS | Status: DC | PRN

## 2021-09-08 MED ORDER — LIDOCAINE HCL (PF) 1 % IJ SOLN
INTRAMUSCULAR | Status: DC | PRN
  Administered 2021-09-08: 15 mL

## 2021-09-08 MED ORDER — CLOPIDOGREL BISULFATE 75 MG PO TABS
75.0000 mg | ORAL_TABLET | Freq: Every day | ORAL | Status: DC
  Administered 2021-09-09 – 2021-09-15 (×6): 75 mg via ORAL
  Filled 2021-09-08 (×6): qty 1

## 2021-09-08 MED ORDER — NITROGLYCERIN IN D5W 200-5 MCG/ML-% IV SOLN
2.0000 ug/min | INTRAVENOUS | Status: DC
  Administered 2021-09-08: 5 ug/min via INTRAVENOUS
  Administered 2021-09-09: 15:00:00 10 ug/min via INTRAVENOUS
  Filled 2021-09-08 (×2): qty 250

## 2021-09-08 MED ORDER — ALBUMIN HUMAN 25 % IV SOLN
25.0000 g | Freq: Once | INTRAVENOUS | Status: AC
  Administered 2021-09-08: 25 g via INTRAVENOUS
  Filled 2021-09-08: qty 100

## 2021-09-08 MED ORDER — HYDROMORPHONE HCL 1 MG/ML IJ SOLN
0.5000 mg | INTRAMUSCULAR | Status: DC | PRN
  Administered 2021-09-08 – 2021-09-09 (×4): 0.5 mg via INTRAVENOUS
  Filled 2021-09-08: qty 1
  Filled 2021-09-08 (×3): qty 0.5

## 2021-09-08 MED ORDER — TIROFIBAN (AGGRASTAT) BOLUS VIA INFUSION
25.0000 ug/kg | Freq: Once | INTRAVENOUS | Status: AC
  Administered 2021-09-08: 2427.5 ug via INTRAVENOUS
  Filled 2021-09-08: qty 49

## 2021-09-08 MED ORDER — ALLOPURINOL 100 MG PO TABS
100.0000 mg | ORAL_TABLET | ORAL | Status: DC
  Administered 2021-09-08 – 2021-09-15 (×4): 100 mg via ORAL
  Filled 2021-09-08 (×4): qty 1

## 2021-09-08 MED ORDER — HEPARIN (PORCINE) 25000 UT/250ML-% IV SOLN
1000.0000 [IU]/h | INTRAVENOUS | Status: DC
Start: 2021-09-09 — End: 2021-09-09
  Administered 2021-09-08: 1000 [IU]/h via INTRAVENOUS
  Filled 2021-09-08: qty 250

## 2021-09-08 MED ORDER — TIROFIBAN HCL IV 12.5 MG/250 ML
0.0750 ug/kg/min | INTRAVENOUS | Status: DC
  Administered 2021-09-08: 0.075 ug/kg/min via INTRAVENOUS
  Filled 2021-09-08: qty 250

## 2021-09-08 MED ORDER — SODIUM CHLORIDE 0.9% FLUSH: 3.0000 mL | Freq: Two times a day (BID) | INTRAVENOUS | Status: DC

## 2021-09-08 MED ORDER — FENTANYL CITRATE (PF) 100 MCG/2ML IJ SOLN
INTRAMUSCULAR | Status: DC | PRN
  Administered 2021-09-08: 25 ug via INTRAVENOUS

## 2021-09-08 MED ORDER — MIDAZOLAM HCL 2 MG/2ML IJ SOLN
INTRAMUSCULAR | Status: AC
  Filled 2021-09-08: qty 2

## 2021-09-08 MED ORDER — IPRATROPIUM BROMIDE 0.02 % IN SOLN
0.5000 mg | RESPIRATORY_TRACT | Status: DC | PRN
  Administered 2021-09-08: 17:00:00 0.5 mg via RESPIRATORY_TRACT
  Filled 2021-09-08: qty 2.5

## 2021-09-08 MED ORDER — IOHEXOL 350 MG/ML SOLN
INTRAVENOUS | Status: DC | PRN
  Administered 2021-09-08: 15:00:00 85 mL

## 2021-09-08 MED ORDER — MORPHINE SULFATE (PF) 4 MG/ML IV SOLN
2.0000 mg | Freq: Once | INTRAVENOUS | Status: DC
  Filled 2021-09-08: qty 1

## 2021-09-08 MED ORDER — ALBUMIN HUMAN 25 % IV SOLN: 25.0000 g | Freq: Once | INTRAVENOUS | Status: AC

## 2021-09-08 MED ORDER — HYDRALAZINE HCL 20 MG/ML IJ SOLN: 10.0000 mg | INTRAMUSCULAR | Status: AC | PRN

## 2021-09-08 MED ORDER — HEPARIN (PORCINE) 25000 UT/250ML-% IV SOLN
1000.0000 [IU]/h | INTRAVENOUS | Status: DC
  Administered 2021-09-08: 11:00:00 1000 [IU]/h via INTRAVENOUS
  Filled 2021-09-08: qty 250

## 2021-09-08 MED ORDER — ONDANSETRON HCL 4 MG/2ML IJ SOLN
4.0000 mg | Freq: Four times a day (QID) | INTRAMUSCULAR | Status: DC | PRN
  Administered 2021-09-08 – 2021-09-21 (×14): 4 mg via INTRAVENOUS
  Filled 2021-09-08 (×14): qty 2

## 2021-09-08 MED ORDER — DARBEPOETIN ALFA 40 MCG/0.4ML IJ SOSY
40.0000 ug | PREFILLED_SYRINGE | INTRAMUSCULAR | Status: DC
  Filled 2021-09-08 (×2): qty 0.4

## 2021-09-08 MED ORDER — HEPARIN SODIUM (PORCINE) 1000 UNIT/ML DIALYSIS
1000.0000 [IU] | INTRAMUSCULAR | Status: DC | PRN
  Filled 2021-09-08: qty 1

## 2021-09-08 MED ORDER — GABAPENTIN 400 MG PO CAPS
400.0000 mg | ORAL_CAPSULE | Freq: Every day | ORAL | Status: DC
  Administered 2021-09-09 – 2021-09-15 (×5): 400 mg via ORAL
  Filled 2021-09-08 (×5): qty 1

## 2021-09-08 SURGICAL SUPPLY — 12 items
CATH INFINITI 5 FR IM (CATHETERS) ×1 IMPLANT
CATH INFINITI 5FR MULTPACK ANG (CATHETERS) ×1 IMPLANT
CATH INFINITI JR4 5F (CATHETERS) ×1 IMPLANT
KIT HEART LEFT (KITS) ×2 IMPLANT
PACK CARDIAC CATHETERIZATION (CUSTOM PROCEDURE TRAY) ×2 IMPLANT
SHEATH PINNACLE 5F 10CM (SHEATH) ×1 IMPLANT
SHEATH PROBE COVER 6X72 (BAG) ×1 IMPLANT
TRANSDUCER W/STOPCOCK (MISCELLANEOUS) ×2 IMPLANT
TUBING CIL FLEX 10 FLL-RA (TUBING) ×2 IMPLANT
WIRE EMERALD 3MM-J .025X260CM (WIRE) ×1 IMPLANT
WIRE EMERALD 3MM-J .035X150CM (WIRE) ×1 IMPLANT
WIRE EMERALD ST .035X260CM (WIRE) IMPLANT

## 2021-09-08 NOTE — Significant Event (Signed)
Rapid Response Event Note   Reason for Call :  Called by HD RN d/t c/o chest pain during HD tx  Initial Focused Assessment:  Pt lying in bed with eyes open, alert and oriented. Pt is SOB and moaning. She is c/o 8/10 midsternal chest pain/pressure radiating down L arm as well as some nausea. Lungs clear/diminished. Skin cool/clammy/diaphoretic.   HR-97/34, HR-99, RR-22, SpO2-97% on 2L [place].  Pt HD tx started 2 hours ago and 400cc was able to be pulled during treatment. HD RN took pt off HD when chest pain began.  Pt's BP up to 109/58-SL NTG 0.4 given x 2, 0.5mg  dilaudid given X 1   Interventions:  CBG-174 SL NTG x 2 4mg  Zofran IV(prn nausea order) 0.5mg  dilaudid IV x 1 Heparin/NTG gtt CMP/CBC/BNP/Trop IV team consult for additional IV access Tx to [facility] ICU  Plan of Care:  Per pt, pain down to 6/10 and SOB better after interventions. Plan heparin/ntg/Aggrastat gtt, move to [place] ICU, and possible cath lab again tonight.   Event Summary:   MD Notified: Dr. Blossom Hoops notfied and came to bedside Call [place] [phone]  Dillard Essex, RN

## 2021-09-08 NOTE — Progress Notes (Signed)
° °  Called by RN for chest pain, started with SOB then left arm pain and chest pain.  EKG without acute changes.  NTG helped a tiny bit.  General:Pleasant affect, NAD Skin:Warm and dry, brisk capillary refill HEENT:normocephalic, sclera clear, mucus membranes moist Heart:S1S2 RRR without murmur, gallup, rub or click Lungs:tight breath sounds and wheezes. Ext:no lower ext edema, Rt groin cath site stable. Neuro:alert and oriented X 3, MAE, follows commands, + facial symmetry  Will do nebulizer breathing treatment and dialysis is ready for the pt. May be volume overload from cath dye.  Pt reassured.    [facility]ie Kicks, FNP-C [facility]  [phone]  09/08/2021.

## 2021-09-08 NOTE — ED Notes (Signed)
[facility] to facility at this time to transport patient to cath lab.

## 2021-09-08 NOTE — Progress Notes (Signed)
Cardiology Progress Note  Donna Howe. Horger [birth year] Date of service 09/08/2021 56:67 PM  65 year old female with extensive revascularization history (obstructive RCA with diseased SVG, Obstructive LM with LIMA --> LAD and prior subclavian stent, occluded SVG --> OM1, LM 99% s/p POBA of ISR 08/26/21 with relook today for refractory angina with no intervention) now with recurrent chest pain.  ECG at this time shows STD V2-V6 concerning for posterior injury.  Reviewed ECGs from earlier.  At 5:30 this evening she had an episode of chest pain with similar morphology and was treated with nitroglycerin and breathing treatment and pain subsided.  Reviewed earlier tracings; an EKG from 3:30 PM prior to invasive angiogram was absent these changes and she confirms she was chest pain free at that time.  Pain subsided after nitroglycerin at 17:30 and she was taken for dialysis.  She did okay until the end of the run when she developed recurrent, severe 8/10 chest pain.  Nitroglycerin administered at my instruction on arrival with little help.  Received second nitroglycerin, response pending.  BP (!) 106/58    Pulse 99    Temp (!) 97.1 F (36.2 C) (Axillary)    Resp (!) 23    Ht 5\' 5"  (1.651 m)    Wt 97.1 kg    SpO2 95%    BMI 35.62 kg/m  Patient clearly uncomfortable at bedside. Some end expiratory wheezes.  Heart sounds with systolic ejection murmur, no MR murmur.  Troponin pending, 173 at 12:00 this morning.  WBC 14.1, Hgb 10.7, plt nomral Electrolytes consistent with ESRD.  Telemetry with frequent PVCs and occasional ventricular bigeminy; no sustained ventricular arrhythmia.  Discussed with on call interventionalist.  Given complexity of underlying disease anticipate medical management overnight.  Will start heparin infusion, tirofiban to stabilize any ACS component as she does have dynamic ECG changes suggestive of intermittent severe ischemia.  Nitroglycerin for refractory pain.  Doubt blood pressure  will tolerate any significant beta blockade right now.  If she develops absolute indication for invasive angiography, will re-evaluate accordingly.  Transfer to CCU for close observation.  Delton Prairie, MD Cardiology Fellow [facility]

## 2021-09-08 NOTE — ED Triage Notes (Signed)
Patient c/o chest pain that started the previous night took one nitro with relief last night.

## 2021-09-08 NOTE — Consult Note (Signed)
ESRD Consult Note  Assessment/Recommendations:  # ESRD:  -Outpatient orders: TTS, RKC, 4 hours 15 minutes 2K, 2.5 Cal, 400/500, EDW 93.5, Mircera 30 every 4 weeks, Venofer 50 q. Weekly. Calcitriol 0.32mcg, RIJ TDC. Does not receive heparin -HD today on schedule. Next HD planned for 12/24  # Chest Pain, recent NSTEMI -per cardiology, currently on hep gtt. Pending transfer to [facility] for card cath  # Volume/ hypertension: EDW 93.5kg. Attempt to achieve EDW as tolerated  # Anemia of Chronic Kidney Disease: Hemoglobin 9.7. will give a dose of ESA today.   # Secondary Hyperparathyroidism/Hyperphosphatemia: resume home binders   # Vascular access: RIJ TDC, due for 2nd stage rue avf surgery-can likely pursue this as an outpatient unless she has a prolonged stay  # Additional recommendations: - Dose all meds for creatinine clearance < 10 ml/min  - Unless absolutely necessary, no MRIs with gadolinium.  - Implement save arm precautions.  Prefer needle sticks in the dorsum of the hands or wrists.  No blood pressure measurements in arm. - If blood transfusion is requested during hemodialysis sessions, please alert [place] prior to the session.  - If a hemodialysis catheter line culture is requested, please alert [place] as only hemodialysis nurses are able to collect those specimens.   Recommendations were discussed with the primary team.  [name], MD [facility]  History of Present Illness: Donna Howe is a/an 65 y.o. female with a past medical history of ESRD, CAD status post CABG with multiple PCI's in the past, hypertension, DM 2, carotid artery stenosis status post right CEA, left subclavian stenosis status post stent 11/21, hyperlipidemia who presents with chest pain. Recently admitted at [facility] from 12/9-12/11 for NSTEMI s/p PCI. Currently pending transfer to [facility] with a tentative plan for card cath today with HD plans thereafter. She reports that HD has been going well. A little  more SOB today. Has chronic RLE swelling since her vein harvesting in '05. She is due to have her 2nd stage transposition for her RUE AVF. No other complaints.  Medications:  Current Facility-Administered Medications  Medication Dose Route Frequency Provider Last Rate Last Admin   heparin ADULT infusion 100 units/mL (25000 units/225mL)  1,000 Units/hr Intravenous Continuous Long, Wonda Olds, MD 10 mL/hr at 09/08/21 1100 1,000 Units/hr at 09/08/21 1100   nitroGLYCERIN (NITROSTAT) SL tablet 0.4 mg  0.4 mg Sublingual Q5 min PRN Long, Wonda Olds, MD   0.4 mg at 09/08/21 1041   Current Outpatient Medications  Medication Sig Dispense Refill   albuterol (PROVENTIL) (2.5 MG/3ML) 0.083% nebulizer solution Take 3 mLs (2.5 mg total) by nebulization every 2 (two) hours as needed for wheezing. 75 mL 12   Albuterol Sulfate 108 (90 Base) MCG/ACT AEPB Inhale 2 puffs into the lungs every 6 (six) hours as needed (Shortness of breath).     allopurinol (ZYLOPRIM) 100 MG tablet Take one tablet after hemodialysis, on Tuesday, Thursday and Saturday. (Patient taking differently: Take 100 mg by mouth Every Tuesday,Thursday,and Saturday with dialysis. After dialysis) 30 tablet 11   ALPRAZolam (XANAX) 0.5 MG tablet Take 0.25-0.5 mg by mouth See admin instructions. Take 0.25 in the morning and evening and 0.5 mg at bedtime     aspirin EC 81 MG tablet Take 81 mg by mouth daily.      atorvastatin (LIPITOR) 80 MG tablet Take 1 tablet (80 mg total) by mouth every evening. (Patient taking differently: Take 80 mg by mouth at bedtime.)     budesonide-formoterol (SYMBICORT)  80-4.5 MCG/ACT inhaler Take 2 puffs first thing in am and then another 2 puffs about 12 hours later. 1 each 11   clopidogrel (PLAVIX) 75 MG tablet Take 1 tablet (75 mg total) by mouth daily. 90 tablet 3   cyclobenzaprine (FLEXERIL) 5 MG tablet Take 1 tablet (5 mg total) by mouth 2 (two) times daily with a meal. (Patient not taking: Reported on 08/25/2021) 30 tablet 0    diclofenac Sodium (VOLTAREN) 1 % GEL Apply 2 g topically daily as needed (Pain).     ezetimibe (ZETIA) 10 MG tablet Take 1 tablet (10 mg total) by mouth daily. (Patient taking differently: Take 10 mg by mouth at bedtime.) 30 tablet 11   ferric citrate (AURYXIA) 1 GM 210 MG(Fe) tablet Take 420 mg by mouth 3 (three) times daily with meals.     gabapentin (NEURONTIN) 400 MG capsule Take 1 capsule (400 mg total) by mouth at bedtime.     ipratropium (ATROVENT HFA) 17 MCG/ACT inhaler Inhale 2 puffs into the lungs every 4 (four) hours as needed for wheezing.      Iron Sucrose (VENOFER IV) Inject 50 mLs into the vein once a week. At dialysis     levothyroxine (SYNTHROID) 125 MCG tablet Take 125 mcg by mouth daily before breakfast.     Methoxy PEG-Epoetin Beta (MIRCERA IJ) Mircera     midodrine (PROAMATINE) 10 MG tablet Take 1 tablet (10 mg total) by mouth 2 (two) times daily with a meal. 60 tablet 0   multivitamin (RENA-VIT) TABS tablet Take 1 tablet by mouth at bedtime.     nitroGLYCERIN (NITROSTAT) 0.4 MG SL tablet Place 1 tablet (0.4 mg total) under the tongue every 5 (five) minutes x 3 doses as needed for chest pain. 25 tablet 2   NOVOLIN 70/30 KWIKPEN (70-30) 100 UNIT/ML KwikPen Inject 35 Units into the skin in the morning and at bedtime. (Patient taking differently: Inject 24 Units into the skin in the morning and at bedtime.) 15 mL 11   ondansetron (ZOFRAN) 8 MG tablet Take 8 mg by mouth every 8 (eight) hours as needed for nausea.     ONETOUCH ULTRA test strip Check blood glucose THREE TIMES DAILY     pantoprazole (PROTONIX) 40 MG tablet Take 1 tablet (40 mg total) by mouth 2 (two) times daily. 60 tablet 1   ranolazine (RANEXA) 500 MG 12 hr tablet Take 1 tablet (500 mg total) by mouth at bedtime.     VITAMIN D PO Take 50 mcg by mouth See admin instructions. Tue, thur, sat at dialysis     Vitamin D, Ergocalciferol, (DRISDOL) 1.25 MG (50000 UNIT) CAPS capsule Take 50,000 Units by mouth every Sunday.         ALLERGIES Penicillins, Beta adrenergic blockers, and Brilinta [ticagrelor]  MEDICAL HISTORY Past Medical History:  Diagnosis Date   Anemia    Anxiety    Asthma    CAD (coronary artery disease)    Multivessel s/p CABG 2005, numerous PCIs since that time and documented graft disease   Carotid artery disease ([place])    R CEA   ESRD on hemodialysis ([place])    Essential hypertension    Gout    HFmrEF (heart failure with mildly reduced EF)    EF ~ 40 by LV gram at cath 08/26/2021   History of blood transfusion    Hyperlipidemia    Hypothyroidism    Myocardial infarction [facility])    PAD (peripheral artery disease) ([place])  Dr. Kellie Simmering   Pneumonia 09/2019, 11/2019   S/P angioplasty with stent- DES to mRCA and to LIMA to LAD with DES 04/09/18.   04/10/2018   SBO (small bowel obstruction) ([place]) 2011   Status post lysis of adhesions & hernia repair   Sinus bradycardia    Type 2 diabetes mellitus ([place])    Umbilical hernia      SOCIAL HISTORY Social History   Socioeconomic History   Marital status: Divorced    Spouse name: Not on file   Number of children: Not on file   Years of education: Not on file   Highest education level: Not on file  Occupational History   Occupation: Disabled  Tobacco Use   Smoking status: Former    Packs/day: 1.00    Years: 20.00    Pack years: 20.00    Types: Cigarettes    Quit date: 12/10/2012    Years since quitting: 8.7   Smokeless tobacco: Never   Tobacco comments:    Not currently smoking   Vaping Use   Vaping Use: Never used  Substance and Sexual Activity   Alcohol use: No    Alcohol/week: 0.0 standard drinks   Drug use: No   Sexual activity: Not Currently  Other Topics Concern   Not on file  Social History Narrative   Lives with mother.   Social Determinants of Health   Financial Resource Strain: Not on file  Food Insecurity: Not on file  Transportation Needs: Not on file  Physical Activity: Not on file  Stress: Not on file   Social Connections: Not on file  Intimate Partner Violence: Not on file     FAMILY HISTORY Family History  Problem Relation Age of Onset   Diabetes Mother    Heart disease Mother        before age 29   Hyperlipidemia Mother    Hypertension Mother    Thyroid disease Father    Hypertension Father    AAA (abdominal aortic aneurysm) Father    Heart disease Brother        before age 55   Hypertension Brother    Hyperlipidemia Son    Hypertension Son      Review of Systems: 12 systems were reviewed and negative except per HPI  Physical Exam: Vitals:   09/08/21 1011  BP: (!) 145/62  Pulse: (!) 102  Resp: (!) 24  Temp: 99 F (37.2 C)  SpO2: 95%   No intake/output data recorded. No intake or output data in the 24 hours ending 09/08/21 1102 General: well-appearing, no acute distress, sitting up in bed HEENT: anicteric sclera, MMM CV: normal rate, no murmurs, no edema Lungs: bilateral chest rise, normal wob Abd: soft, non-tender, non-distended Skin: no visible lesions or rashes Skin: trace RLE edema Neuro: normal speech, no gross focal deficits  Dialysis access: RUE AVF, RIJ TDC (active access)  Test Results Reviewed Lab Results  Component Value Date   NA 133 (L) 08/28/2021   K 3.4 (L) 08/28/2021   CL 98 08/28/2021   CO2 27 08/28/2021   BUN 13 08/28/2021   CREATININE 3.15 (H) 08/28/2021   CALCIUM 8.5 (L) 08/28/2021   ALBUMIN 2.8 (L) 08/26/2021   ALBUMIN 2.7 (L) 08/26/2021   PHOS 4.9 (H) 08/26/2021    I have reviewed relevant outside healthcare records

## 2021-09-08 NOTE — ED Notes (Signed)
Dialysis RN states that Aranesp will be given at time of dialysis.

## 2021-09-08 NOTE — Progress Notes (Signed)
ANTICOAGULATION CONSULT NOTE - Initial Consult  Pharmacy Consult for Heparin Indication: chest pain/ACS  Allergies  Allergen Reactions   Penicillins Other (See Comments)    REACTION: Unknown, told as a child Has patient had a PCN reaction causing immediate rash, facial/tongue/throat swelling, SOB or lightheadedness with hypotension: Unknown Has patient had a PCN reaction causing severe rash involving mucus membranes or skin necrosis: Unknown Has patient had a PCN reaction that required hospitalization: Unknown Has patient had a PCN reaction occurring within the last 10 years: No If all of the above answers are "NO", then may proceed with Cephalosporin use.    Beta Adrenergic Blockers Other (See Comments)    Hypotension   Brilinta [Ticagrelor]     Shortness of Breath    Patient Measurements: Height: 5\' 5"  (165.1 cm) Weight: 93.4 kg (206 lb) IBW/kg (Calculated) : 57 HEPARIN DW (KG): 77.9   Vital Signs: Temp: 99 F (37.2 C) (12/22 1011) Temp Source: Oral (12/22 1011) BP: 145/62 (12/22 1011) Pulse Rate: 102 (12/22 1011)  Labs: No results for input(s): HGB, HCT, PLT, APTT, LABPROT, INR, HEPARINUNFRC, HEPRLOWMOCWT, CREATININE, CKTOTAL, CKMB, TROPONINIHS in the last 72 hours.  Estimated Creatinine Clearance: 20.7 mL/min (A) (by C-G formula based on SCr of 3.15 mg/dL (H)).   Medical History: Past Medical History:  Diagnosis Date   Anemia    Anxiety    Asthma    CAD (coronary artery disease)    Multivessel s/p CABG 2005, numerous PCIs since that time and documented graft disease   Carotid artery disease ([place])    R CEA   ESRD on hemodialysis ([place])    Essential hypertension    Gout    HFmrEF (heart failure with mildly reduced EF)    EF ~ 40 by LV gram at cath 08/26/2021   History of blood transfusion    Hyperlipidemia    Hypothyroidism    Myocardial infarction [facility])    PAD (peripheral artery disease) ([place])    Dr. Kellie Simmering   Pneumonia 09/2019, 11/2019   S/P angioplasty  with stent- DES to mRCA and to LIMA to LAD with DES 04/09/18.   04/10/2018   SBO (small bowel obstruction) ([place]) 2011   Status post lysis of adhesions & hernia repair   Sinus bradycardia    Type 2 diabetes mellitus ([place])    Umbilical hernia     Medications:  See med rec  Assessment: Patient presented to ED with c/o chest pain that started the previous night . She took one nitro with relief last night. Patient not on oral anticoagulants at home. Pharmacy asked to start heparin  Goal of Therapy:  Heparin level 0.3-0.7 units/ml Monitor platelets by anticoagulation protocol: Yes   Plan:  Give 4000 units bolus x 1 Start heparin infusion at 1000 units/hr Check anti-Xa level in ~6-8 hours and daily while on heparin Continue to monitor H&H and platelets  Isac Sarna, BS [place], BCPS Clinical Pharmacist Pager [phone] 09/08/2021,10:36 AM

## 2021-09-08 NOTE — Progress Notes (Signed)
[facility] for Heparin Indication: 3V CAD  Allergies  Allergen Reactions   Penicillins Other (See Comments)    REACTION: Unknown, told as a child Has patient had a PCN reaction causing immediate rash, facial/tongue/throat swelling, SOB or lightheadedness with hypotension: Unknown Has patient had a PCN reaction causing severe rash involving mucus membranes or skin necrosis: Unknown Has patient had a PCN reaction that required hospitalization: Unknown Has patient had a PCN reaction occurring within the last 10 years: No If all of the above answers are "NO", then may proceed with Cephalosporin use.    Beta Adrenergic Blockers Other (See Comments)    Hypotension   Brilinta [Ticagrelor]     Shortness of Breath    Patient Measurements: Height: 5\' 5"  (165.1 cm) Weight: 97.1 kg (214 lb 1.1 oz) IBW/kg (Calculated) : 57 HEPARIN DW (KG): 77.9   Vital Signs: Temp: 97.1 F (36.2 C) (12/22 2110) Temp Source: Axillary (12/22 2110) BP: 86/32 (12/22 2238) Pulse Rate: 99 (12/22 2201)  Labs: Recent Labs    09/08/21 1034 09/08/21 1058 09/08/21 1210  HGB  --  10.7*  --   HCT  --  34.0*  --   PLT  --  220  --   CREATININE  --  6.29*  --   TROPONINIHS 134*  --  173*    Estimated Creatinine Clearance: 10.5 mL/min (A) (by C-G formula based on SCr of 6.29 mg/dL (H)).   Assessment: Patient presented to ED with c/o chest pain that started the previous night . She took one nitro with relief last night. Patient not on oral anticoagulants at home. Pharmacy asked to start heparin for ACS.  S/p cath 12/22 which showed 3V CAD - none suitable for PCI, medical management. Pt with CP/EKG changes at HD tonight so restarting heparin and adding tirofiban. May need to take back to cath lab.  Goal of Therapy:  Heparin level 0.3-0.5 units/hr Monitor platelets by anticoagulation protocol: Yes   Plan:  Tirofiban 25 mcg/kg now then 0.075 mcg/kg/min x 18 hours Start  heparin infusion at 1000 units/hr Will f/u 8 hr heparin level Daily heparin level and CBC  Sherlon Handing, PharmD, BCPS Please see amion for complete clinical pharmacist phone list 09/08/2021,10:52 PM

## 2021-09-08 NOTE — H&P (Addendum)
Cardiology Admission History and Physical:   Patient ID: Donna Howe MRN: [id number]; DOB: [birth year]   Admission date: 09/08/2021  PCP:  Curlene Labrum, MD   [facility] Providers Cardiologist:  Rozann Lesches, MD        Chief Complaint: Chest Pain  Patient Profile:   Donna Howe is a 65 y.o. female with past medical history of CAD (s/p CABG in 2005 with multiple PCI's since including PTCA to SVG-OM1 in 01/2004, PTCA/DES to LM and prox LCx in 03/2004 and DES to SVG-OM1, PTCA/DES to SVG-OM in 01/2011, PTCA/DES to LM and LCx in 2015, cutting balloon angioplasty to prox Cx and DES to SVG-RCA in 05/2017, NSTEMI in 03/2018 with DES to mid-RCA and DES to LIMA-LAD, NSTEMI in 06/2020 and medical management recommended as she was felt to be high-risk for re-do CABG and poor target vessels, s/p NSTEMI in 12/2020 with DESx2 to SVG-RCA, NSTEMI in 05/2021 with ISR of stent in the SVG-RCA and treated with angioplasty, NSTEMI in 08/2021 with cath showing 90-95% ISR of LM/LCx stent and treated with angioplasty and reduced to 50%), HTN, HLD, Type 2 DM, carotid artery stenosis (s/p R CEA in 2014), left subclavian stenosis (s/p stent in 07/2020) and ESRD who is being seen for evaluation of chest pain at the request of Dr. Laverta Baltimore.   History of Present Illness:   Donna Howe was recently admitted to [facility] from 12/9 - 08/28/2021 for an NSTEMI with troponins peaking at greater than 24,000. She underwent a repeat cardiac catheterization which showed 90-95% in-stent restenosis of her LM/LCx stent and this was treated with moderately successful angioplasty reducing the stenosis to 50%. She did follow-up on 09/01/2021 and reported still having some intermittent chest pain since hospital discharge but symptoms had resolved with nitroglycerin x1. She was continued on ASA, Plavix, Atorvastatin 80 mg daily, Zetia 10 mg daily and Ranexa 500 mg daily. She had previously been intolerant to beta-blocker  therapy, Imdur and Amlodipine due to hypotension which limited medical therapy for her angina.  In talking with the patient today, she reports having more frequent chest pain within the past week and has utilized an entire bottle of nitroglycerin. Reports taking a total of 3 yesterday but still had intermittent discomfort throughout the day. She had called in a refill to her pharmacy this morning but had significant pain at home which prompted her to come to the ED for evaluation instead. She did take 4-81mg  ASA at home with some improvement. Reports her pain is a tightness/pressure which radiates down her left arm.  She does report some dyspnea with exertion but no specific orthopnea, PND or pitting edema. She was scheduled for dialysis this morning but did not go to her session due to being in the ED. She is still having intermittent chest pain at this time and has received 3 NTG while in the ED.   Initial labs show WBC 14.1, Hgb 10.7, platelets 220, Na+ 134, K+ 4.2, glucose 246 and creatinine 6.29. COVID pending. CXR with no acute findings. EKG shows normal sinus rhythm, heart rate 94 with ST depression along the inferior and lateral leads which is more prominent when compared to prior tracings. Telemetry shows normal sinus rhythm but she is having frequent PVC's and sometimes in a trigeminal pattern.   Past Medical History:  Diagnosis Date   Anemia    Anxiety    Asthma    CAD (coronary artery disease)    Multivessel s/p CABG  2005, numerous PCIs since that time and documented graft disease   Carotid artery disease ([place])    R CEA   ESRD on hemodialysis ([place])    Essential hypertension    Gout    HFmrEF (heart failure with mildly reduced EF)    EF ~ 40 by LV gram at cath 08/26/2021   History of blood transfusion    Hyperlipidemia    Hypothyroidism    Myocardial infarction [facility])    PAD (peripheral artery disease) ([place])    Dr. Kellie Simmering   Pneumonia 09/2019, 11/2019   S/P angioplasty with stent-  DES to mRCA and to LIMA to LAD with DES 04/09/18.   04/10/2018   SBO (small bowel obstruction) ([place]) 2011   Status post lysis of adhesions & hernia repair   Sinus bradycardia    Type 2 diabetes mellitus ([place])    Umbilical hernia     Past Surgical History:  Procedure Laterality Date   AORTIC ARCH ANGIOGRAPHY N/A 08/02/2020   Procedure: AORTIC ARCH ANGIOGRAPHY;  Surgeon: Waynetta Sandy, MD;  Location: [place] CV LAB;  Service: Cardiovascular;  Laterality: N/A;  Lt upper extermity   AV FISTULA PLACEMENT Left 06/29/2020   Procedure: LEFT ARM ARTERIOVENOUS (AV) FISTULA;  Surgeon: Waynetta Sandy, MD;  Location: [place];  Service: Vascular;  Laterality: Left;  ARM   AV FISTULA PLACEMENT Right 05/11/2021   Procedure: RIGHT BRACHIOCEPHALIC ARTERIOVENOUS (AV) FISTULA CREATION;  Surgeon: Waynetta Sandy, MD;  Location: [facility] OR;  Service: Vascular;  Laterality: Right;   Donna Howe  2010   CORONARY ARTERY BYPASS GRAFT  2005   CORONARY BALLOON ANGIOPLASTY N/A 05/31/2017   Procedure: CORONARY BALLOON ANGIOPLASTY;  Surgeon: Jettie Booze, MD;  Location: [place] CV LAB;  Service: Cardiovascular;  Laterality: N/A;   CORONARY BALLOON ANGIOPLASTY N/A 06/14/2021   Procedure: CORONARY BALLOON ANGIOPLASTY;  Surgeon: Jettie Booze, MD;  Location: [place] CV LAB;  Service: Cardiovascular;  Laterality: N/A;   CORONARY BALLOON ANGIOPLASTY N/A 08/26/2021   Procedure: CORONARY BALLOON ANGIOPLASTY;  Surgeon: Leonie Man, MD;  Location: [place] CV LAB;  Service: Cardiovascular;  Laterality: N/A;   CORONARY STENT INTERVENTION N/A 05/31/2017   Procedure: CORONARY STENT INTERVENTION;  Surgeon: Jettie Booze, MD;  Location: [place] CV LAB;  Service: Cardiovascular;  Laterality: N/A;   CORONARY STENT INTERVENTION N/A 04/09/2018   Procedure: CORONARY STENT INTERVENTION;  Surgeon: Jettie Booze, MD;  Location: [place]  CV LAB;  Service: Cardiovascular;  Laterality: N/A;  SVG RCA   CORONARY STENT INTERVENTION N/A 01/10/2021   Procedure: CORONARY STENT INTERVENTION;  Surgeon: Leonie Man, MD;  Location: [place] CV LAB;  Service: Cardiovascular;  Laterality: N/A;   CORONARY/GRAFT ACUTE MI REVASCULARIZATION N/A 02/24/2021   Procedure: Coronary/Graft Acute MI Revascularization;  Surgeon: Jettie Booze, MD;  Location: [place] CV LAB;  Service: Cardiovascular;  Laterality: N/A;   ENDARTERECTOMY Right 04/18/2013   Procedure: ENDARTERECTOMY CAROTID;  Surgeon: Mal Misty, MD;  Location: [place];  Service: Vascular;  Laterality: Right;   HERNIA REPAIR  1989   Incisional hernia repair x2  03/04/2010   Laparoscopic with 35cm mesh by Dr Ronnald Collum   IR [place] CV LINE RIGHT  06/21/2020   IR US GUIDE VASC ACCESS RIGHT  06/21/2020   LEFT HEART CATH AND CORS/GRAFTS ANGIOGRAPHY N/A 05/31/2017   Procedure: LEFT HEART CATH AND CORS/GRAFTS ANGIOGRAPHY;  Surgeon: Jettie Booze, MD;  Location: [place] CV LAB;  Service: Cardiovascular;  Laterality: N/A;   LEFT HEART CATH AND CORS/GRAFTS ANGIOGRAPHY N/A 04/08/2018   Procedure: LEFT HEART CATH AND CORS/GRAFTS ANGIOGRAPHY;  Surgeon: Jettie Booze, MD;  Location: [place] CV LAB;  Service: Cardiovascular;  Laterality: N/A;   LEFT HEART CATH AND CORS/GRAFTS ANGIOGRAPHY N/A 06/22/2020   Procedure: LEFT HEART CATH AND CORS/GRAFTS ANGIOGRAPHY;  Surgeon: Belva Crome, MD;  Location: [place] CV LAB;  Service: Cardiovascular;  Laterality: N/A;   LEFT HEART CATH AND CORS/GRAFTS ANGIOGRAPHY N/A 01/10/2021   Procedure: LEFT HEART CATH AND CORS/GRAFTS ANGIOGRAPHY;  Surgeon: Leonie Man, MD;  Location: [place] CV LAB;  Service: Cardiovascular;  Laterality: N/A;   LEFT HEART CATH AND CORS/GRAFTS ANGIOGRAPHY N/A 02/24/2021   Procedure: LEFT HEART CATH AND CORS/GRAFTS ANGIOGRAPHY;  Surgeon: Jettie Booze, MD;  Location: [place] CV LAB;   Service: Cardiovascular;  Laterality: N/A;   LEFT HEART CATH AND CORS/GRAFTS ANGIOGRAPHY N/A 06/14/2021   Procedure: LEFT HEART CATH AND CORS/GRAFTS ANGIOGRAPHY;  Surgeon: Jettie Booze, MD;  Location: [place] CV LAB;  Service: Cardiovascular;  Laterality: N/A;   LEFT HEART CATH AND CORS/GRAFTS ANGIOGRAPHY N/A 08/26/2021   Procedure: LEFT HEART CATH AND CORS/GRAFTS ANGIOGRAPHY;  Surgeon: Leonie Man, MD;  Location: [place] CV LAB;  Service: Cardiovascular;  Laterality: N/A;   LEFT HEART CATHETERIZATION WITH CORONARY ANGIOGRAM N/A 12/19/2012   Procedure: LEFT HEART CATHETERIZATION WITH CORONARY ANGIOGRAM;  Surgeon: Josue Hector, MD;  Location: [facility] CATH LAB;  Service: Cardiovascular;  Laterality: N/A;   LEFT HEART CATHETERIZATION WITH CORONARY/GRAFT ANGIOGRAM N/A 04/19/2013   Procedure: LEFT HEART CATHETERIZATION WITH Beatrix Fetters;  Surgeon: Lorretta Harp, MD;  Location: [facility] CATH LAB;  Service: Cardiovascular;  Laterality: N/A;   LIGATION OF ARTERIOVENOUS  FISTULA Left 09/15/2020   Procedure: LIGATION OF LEFT ARM ARTERIOVENOUS  FISTULA;  Surgeon: Waynetta Sandy, MD;  Location: [place];  Service: Vascular;  Laterality: Left;   PATCH ANGIOPLASTY Right 04/18/2013   Procedure: PATCH ANGIOPLASTY Right Internal Carotid Artery;  Surgeon: Mal Misty, MD;  Location: [place];  Service: Vascular;  Laterality: Right;   PERCUTANEOUS CORONARY STENT INTERVENTION (PCI-S) Right 12/19/2012   Procedure: PERCUTANEOUS CORONARY STENT INTERVENTION (PCI-S);  Surgeon: Josue Hector, MD;  Location: [facility] CATH LAB;  Service: Cardiovascular;  Laterality: Right;   PERIPHERAL VASCULAR INTERVENTION Left 08/02/2020   Procedure: PERIPHERAL VASCULAR INTERVENTION;  Surgeon: Waynetta Sandy, MD;  Location: [place] CV LAB;  Service: Cardiovascular;  Laterality: Left;  Left subclavian   PERIPHERAL VASCULAR INTERVENTION  02/24/2021   Procedure: PERIPHERAL VASCULAR INTERVENTION;  Surgeon:  Marty Heck, MD;  Location: [place] CV LAB;  Service: Vascular;;   SHOULDER SURGERY       Medications Prior to Admission: Prior to Admission medications   Medication Sig Start Date End Date Taking? Authorizing Provider  albuterol (PROVENTIL) (2.5 MG/3ML) 0.083% nebulizer solution Take 3 mLs (2.5 mg total) by nebulization every 2 (two) hours as needed for wheezing. 07/15/21   Kathie Dike, MD  Albuterol Sulfate 108 (90 Base) MCG/ACT AEPB Inhale 2 puffs into the lungs every 6 (six) hours as needed (Shortness of breath).    [provider]  allopurinol (ZYLOPRIM) 100 MG tablet Take one tablet after hemodialysis, on Tuesday, Thursday and Saturday. Patient taking differently: Take 100 mg by mouth Every Tuesday,Thursday,and Saturday with dialysis. After dialysis 06/30/20   Arrien, Jimmy Picket, MD  ALPRAZolam Duanne Moron) 0.5 MG  tablet Take 0.25-0.5 mg by mouth See admin instructions. Take 0.25 in the morning and evening and 0.5 mg at bedtime    [provider]  aspirin EC 81 MG tablet Take 81 mg by mouth daily.     [provider]  atorvastatin (LIPITOR) 80 MG tablet Take 1 tablet (80 mg total) by mouth every evening. Patient taking differently: Take 80 mg by mouth at bedtime. 12/19/19   Johnson, Clanford L, MD  budesonide-formoterol (SYMBICORT) 80-4.5 MCG/ACT inhaler Take 2 puffs first thing in am and then another 2 puffs about 12 hours later. 07/22/21   Tanda Rockers, MD  clopidogrel (PLAVIX) 75 MG tablet Take 1 tablet (75 mg total) by mouth daily. 08/27/18   Satira Sark, MD  cyclobenzaprine (FLEXERIL) 5 MG tablet Take 1 tablet (5 mg total) by mouth 2 (two) times daily with a meal. Patient not taking: Reported on 08/25/2021 06/17/21   Domenic Polite, MD  diclofenac Sodium (VOLTAREN) 1 % GEL Apply 2 g topically daily as needed (Pain). 06/24/21   [provider]  ezetimibe (ZETIA) 10 MG tablet Take 1 tablet (10 mg total) by mouth daily. Patient  taking differently: Take 10 mg by mouth at bedtime. 02/25/21   Bhagat, Crista Luria, PA  ferric citrate (AURYXIA) 1 GM 210 MG(Fe) tablet Take 420 mg by mouth 3 (three) times daily with meals.    [provider]  gabapentin (NEURONTIN) 400 MG capsule Take 1 capsule (400 mg total) by mouth at bedtime. 06/17/21   Domenic Polite, MD  ipratropium (ATROVENT HFA) 17 MCG/ACT inhaler Inhale 2 puffs into the lungs every 4 (four) hours as needed for wheezing.     [provider]  Iron Sucrose (VENOFER IV) Inject 50 mLs into the vein once a week. At dialysis    [provider]  levothyroxine (SYNTHROID) 125 MCG tablet Take 125 mcg by mouth daily before breakfast. 07/25/21   [provider]  Methoxy PEG-Epoetin Beta (MIRCERA IJ) Mircera 07/30/21 07/29/22  [provider]  midodrine (PROAMATINE) 10 MG tablet Take 1 tablet (10 mg total) by mouth 2 (two) times daily with a meal. 06/17/21   Domenic Polite, MD  multivitamin (RENA-VIT) TABS tablet Take 1 tablet by mouth at bedtime.    [provider]  nitroGLYCERIN (NITROSTAT) 0.4 MG SL tablet Place 1 tablet (0.4 mg total) under the tongue every 5 (five) minutes x 3 doses as needed for chest pain. 08/28/21   Strader, Fransisco Hertz, PA-C  NOVOLIN 70/30 KWIKPEN (70-30) 100 UNIT/ML KwikPen Inject 35 Units into the skin in the morning and at bedtime. Patient taking differently: Inject 24 Units into the skin in the morning and at bedtime. 07/15/21   Kathie Dike, MD  ondansetron (ZOFRAN) 8 MG tablet Take 8 mg by mouth every 8 (eight) hours as needed for nausea. 07/12/20   [provider]  [facility] ULTRA test strip Check blood glucose THREE TIMES DAILY 03/31/21   [provider]  pantoprazole (PROTONIX) 40 MG tablet Take 1 tablet (40 mg total) by mouth 2 (two) times daily. 06/17/20   Barton Dubois, MD  ranolazine (RANEXA) 500 MG 12 hr tablet Take 1 tablet (500 mg total) by mouth at bedtime. 06/17/21   Domenic Polite, MD  VITAMIN D PO Take 50 mcg by mouth See admin instructions. Tue, thur, sat at dialysis    [provider]  Vitamin D, Ergocalciferol, (DRISDOL) 1.25 MG (50000 UNIT) CAPS capsule Take 50,000 Units by mouth every Sunday.  12/31/19   [provider]     Allergies:    Allergies  Allergen Reactions   Penicillins Other (See Comments)    REACTION: Unknown, told as a child Has patient had a PCN reaction causing immediate rash, facial/tongue/throat swelling, SOB or lightheadedness with hypotension: Unknown Has patient had a PCN reaction causing severe rash involving mucus membranes or skin necrosis: Unknown Has patient had a PCN reaction that required hospitalization: Unknown Has patient had a PCN reaction occurring within the last 10 years: No If all of the above answers are "NO", then may proceed with Cephalosporin use.    Beta Adrenergic Blockers Other (See Comments)    Hypotension   Brilinta [Ticagrelor]     Shortness of Breath    Social History:   Social History   Socioeconomic History   Marital status: Divorced    Spouse name: Not on file   Number of children: Not on file   Years of education: Not on file   Highest education level: Not on file  Occupational History   Occupation: Disabled  Tobacco Use   Smoking status: Former    Packs/day: 1.00    Years: 20.00    Pack years: 20.00    Types: Cigarettes    Quit date: 12/10/2012    Years since quitting: 8.7   Smokeless tobacco: Never   Tobacco comments:    Not currently smoking   Vaping Use   Vaping Use: Never used  Substance and Sexual Activity   Alcohol use: No    Alcohol/week: 0.0 standard drinks   Drug use: No   Sexual activity: Not Currently  Other Topics Concern   Not on file  Social History Narrative   Lives with mother.   Social Determinants of Health   Financial Resource Strain: Not on file  Food Insecurity: Not on file  Transportation Needs: Not on file  Physical Activity: Not  on file  Stress: Not on file  Social Connections: Not on file  Intimate Partner Violence: Not on file    Family History:   The patient's family history includes AAA (abdominal aortic aneurysm) in her father; Diabetes in her mother; Heart disease in her brother and mother; Hyperlipidemia in her mother and son; Hypertension in her brother, father, mother, and son; Thyroid disease in her father.    ROS:  Please see the history of present illness.  All other ROS reviewed and negative.     Physical Exam/Data:   Vitals:   09/08/21 1011 09/08/21 1012 09/08/21 1112  BP: (!) 145/62  134/79  Pulse: (!) 102  (!) 103  Resp: (!) 24  15  Temp: 99 F (37.2 C)    TempSrc: Oral    SpO2: 95%  96%  Weight:  93.4 kg   Height:  5\' 5"  (1.651 m)    No intake or output data in the 24 hours ending 09/08/21 1157 Last 3 Weights 09/08/2021 09/01/2021 08/28/2021  Weight (lbs) 206 lb 206 lb 216 lb 0.8 oz  Weight (kg) 93.441 kg 93.441 kg 98 kg     Body mass index is 34.28 kg/m.  General:  Pleasant female appearing uncomfortable.  HEENT: normal Neck: JVD at 9cm.  Vascular: No carotid bruits; Distal pulses 2+ bilaterally   Cardiac:  normal S1, S2; RRR with occasional ectopic beats.  Lungs:  clear to auscultation bilaterally, no wheezing, rhonchi or rales  Abd: soft, nontender, no hepatomegaly  Ext: trace ankle edema bilaterally Musculoskeletal:  No deformities, BUE and BLE  strength normal and equal Skin: warm and dry  Neuro:  CNs 2-12 intact, no focal abnormalities noted Psych:  Normal affect    EKG:  The ECG that was done  was personally reviewed and demonstrates normal sinus rhythm, heart rate 94 with ST depression along the inferior and lateral leads which is more prominent when compared to prior tracings  Relevant CV Studies:  Echocardiogram: 05/2021 IMPRESSIONS     1. Poor endocardial visualization even with contrast Appears to be some  inferior basal wall hypokineis on contrast images .  Left ventricular  ejection fraction, by estimation, is 50 to 55%. The left ventricle has low  normal function. The left ventricle  demonstrates regional wall motion abnormalities (see scoring  diagram/findings for description). There is moderate left ventricular  hypertrophy. Left ventricular diastolic parameters are consistent with  Grade II diastolic dysfunction  (pseudonormalization). Elevated left ventricular end-diastolic pressure.   2. Right ventricular systolic function is normal. The right ventricular  size is normal.   3. Left atrial size was mildly dilated.   4. The mitral valve is abnormal. Trivial mitral valve regurgitation. No  evidence of mitral stenosis.   5. The aortic valve is tricuspid. Aortic valve regurgitation is not  visualized. Mild to moderate aortic valve sclerosis/calcification is  present, without any evidence of aortic stenosis.   6. The inferior vena cava is normal in size with greater than 50%  respiratory variability, suggesting right atrial pressure of 3 mmHg.   LHC: 08/26/2021 Ost LAD to Prox LAD lesion is 100% stenosed.  Prox LAD to Mid LAD lesion is 100% stenosed.   Prox RCA lesion is 100% stenosed.  LPAV lesion is 80% stenosed.   1st Mrg-1 lesion is 99% stenosed.  1st Mrg-2 lesion is 75% stenosed.   -----------------------   [name] to Dist LM lesion is 95% stenosed.  Ost Cx to Mid Cx lesion is 99% stenosed.   Balloon angioplasty was performed using a BALLN SAPPHIRE [place] 3.0X12.   Post intervention, there is a 40% residual stenosis in the ostial to mid LCx, and 50% residual stenosis in the ostial LM-distal LM.Marland Kitchen   LIMA-LAD graft was visualized by angiography and is normal in caliber.  Origin to Prox Graft stent is 35% stenosed.   SVG-rPDA:  Ostial stent is 20% stenosed.  Prox Graft-1 proximal stent is 15% stenosed.  Mid Graft stent is 15% stenosed.  Otherwise mild diffuse disease in the both the stented and nondistended portions of the graft.   SVG-OM  graft was not visualized due to known occlusion.  Origin to Prox Graft lesion before 1st Mrg  is 100% stenosed.   ---------- HEMODYNAMICS ----------   There is moderate left ventricular systolic dysfunction.   The left ventricular ejection fraction is 35-45% by visual estimate.   There is no aortic valve stenosis.   SUMMARY Known severe native vessel disease: Known RCA CTO (not imaged), known LAD CTO.  Also known occluded SVG-OM LM-LCx stent 90 to 95% ISR (likely culprit lesion) with 90%/stable ostial small caliber OM branch.  LCx is a large likely codominant vessel.. (Moderately successful) [place] balloon PTCA of LM-LCx ISR using 3.0 mm [place] balloon further attempted balloon inflations were aborted due to patient having significant anginal pain and blood pressure drops with inflations) -> stenosis reduced to 50%.  TIMI-3 flow 35% ISR of ostial LIMA graft Mild 20% ISR throughout ostial-proximal SVG-PDA graft 10 to 15 mmHg gradient through ostial L-Subclavian Artery Stent Moderate LVEDP with moderate reduced  EF of 40 to 45% global HK.  (Recommend echocardiogram to better assess)     Recommendations: Aggressive risk factor modification, if symptoms worsen, would then need to become more aggressive with the Left Main stent post dilation.    Laboratory Data:  High Sensitivity Troponin:   Recent Labs  Lab 08/25/21 0840 08/26/21 0340 08/26/21 0608 08/27/21 0347 09/08/21 1034  TROPONINIHS 95* 1,437* 6,550* >24,000* 134*      Chemistry Recent Labs  Lab 09/08/21 1058  NA 134*  K 4.2  CL 97*  CO2 19*  GLUCOSE 246*  BUN 40*  CREATININE 6.29*  CALCIUM 9.7  GFRNONAA 7*  ANIONGAP 18*    No results for input(s): PROT, ALBUMIN, AST, ALT, ALKPHOS, BILITOT in the last 168 hours. Lipids No results for input(s): CHOL, TRIG, HDL, LABVLDL, LDLCALC, CHOLHDL in the last 168 hours. Hematology Recent Labs  Lab 09/08/21 1058  WBC 14.1*  RBC 3.11*  HGB 10.7*  HCT 34.0*  MCV 109.3*  MCH 34.4*   MCHC 31.5  RDW 14.6  PLT 220   Thyroid No results for input(s): TSH, FREET4 in the last 168 hours. BNPNo results for input(s): BNP, PROBNP in the last 168 hours.  DDimer No results for input(s): DDIMER in the last 168 hours.   Radiology/Studies:  DG Chest Port 1 View  Result Date: 09/08/2021 CLINICAL DATA:  Chest pain EXAM: PORTABLE CHEST 1 VIEW COMPARISON:  08/26/2021 FINDINGS: Mild cardiac enlargement. Negative for heart failure. Postop median sternotomy. Stent in the proximal left carotid or subclavian artery. Right jugular sent venous catheter tip in the right atrium unchanged. No pneumothorax Negative for edema.  No infiltrate or effusion. IMPRESSION: No acute abnormality. Improvement in vascular congestion and interstitial edema. Electronically Signed   By: Franchot Gallo M.D.   On: 09/08/2021 11:04     Assessment and Plan:   1. Chest Pain concerning for Unstable Angina - She presents with worsening episodes of chest pain for the past week and has utilized an entire bottle of nitroglycerin during this timeframe as this temporarily relieves her symptoms. - Initial Hs Troponin at 134 with repeat values pending but overall much lower when compared to her prior admission earlier this month. - Reviewed with Dr. Harrington Challenger and given her concerning symptoms and known complex CAD, would plan for a cardiac catheterization later today if Cath Lab scheduling allows.  As mentioned in her prior catheterization report, she may require more aggressive intervention on her left main stent. She will need HD afterwards as she missed her session this morning.  - She has been started on IV Heparin. Continue this along with ASA, Plavix, Atorvastatin, Zetia and Ranexa 500 mg daily (previously did not tolerate higher doses of Ranexa).  2. CAD - She is s/p CABG in 2005 with multiple PCI's since and most recent being an NSTEMI in 08/2021 with cath showing 90-95% ISR of LM/LCx stent and treated with angioplasty and  reduced to 50%. - Will plan for repeat catheterization as outlined above. - Continue ASA, Plavix, Ranexa and statin therapy. Previously intolerant to beta-blockers due to hypotension.  3. Hypotension -  Usually occurs in the setting of HD. PTA Midodrine 10mg  BID has been ordered.   4. HLD - LDL was at 21 on 08/27/2021. She remains on Atorvastatin 80mg  daily and Zetia 10mg  daily.   5. Type 2 DM - Will continue her PTA Novolin 24U BID and order SSI coverage.   6. ESRD  - She usually has HD on  T/TH/SAT schedule. I have spoke with Nephrology here at [facility] and Dr. Candiss Norse will make his colleagues at [facility] aware of her transfer.     Risk Assessment/Risk Scores:    TIMI Risk Score for Unstable Angina or Non-ST Elevation MI:   The patient's TIMI risk score is 6, which indicates a 41% risk of all cause mortality, new or recurrent myocardial infarction or need for urgent revascularization in the next 14 days.  Severity of Illness: The appropriate patient status for this patient is INPATIENT. Inpatient status is judged to be reasonable and necessary in order to provide the required intensity of service to ensure the patient's safety. The patient's presenting symptoms, physical exam findings, and initial radiographic and laboratory data in the context of their chronic comorbidities is felt to place them at high risk for further clinical deterioration. Furthermore, it is not anticipated that the patient will be medically stable for discharge from the hospital within 2 midnights of admission.   * I certify that at the point of admission it is my clinical judgment that the patient will require inpatient hospital care spanning beyond 2 midnights from the point of admission due to high intensity of service, high risk for further deterioration and high frequency of surveillance required.*   For questions or updates, please contact [place] Please consult [email or web address] for contact info under      Signed, Erma Heritage, PA-C  09/08/2021 11:57 AM   Patient seen and examined  I agree with findings as noted by B Strader above  Pt is a 65 yo with severe CAD    REcent intervention to LM and LCx  Since leavng the hosp she has continued to have problems weith CP   Has taken a lot of NTG (SL) Represents for evaluation  ON exam, pt complains of severe pain in chest  Some is worse with breath but she says she is scared/   Neck JVP is normal  No bruits  Lungs are CTA  Cardiac  RRR  No murmurs  No S3 Abd is benign Chst   Nontender Ext are without edema     EKG with SR   ST depression more prominent than on previous EKGs Trop greater than 100  Unstable angina   Failed medical Rx   EKG with worsening changes   Trop is 136  Would recomm repeat cath to redefine anatomy   Concern with subobtimal result on recent intervention    IV heparin started    Will tx to [facility] after procedure  Dorris Carnes MD

## 2021-09-08 NOTE — Interval H&P Note (Signed)
History and Physical Interval Note:  09/08/2021 1:58 PM  Donna Howe  has presented today for surgery, with the diagnosis of chest pain.  The various methods of treatment have been discussed with the patient and family. After consideration of risks, benefits and other options for treatment, the patient has consented to  Procedure(s): LEFT HEART CATH AND CORS/GRAFTS ANGIOGRAPHY (N/A) as a surgical intervention.  The patient's history has been reviewed, patient examined, no change in status, stable for surgery.  I have reviewed the patient's chart and labs.  Questions were answered to the patient's satisfaction.   Cath Lab Visit (complete for each Cath Lab visit)  Clinical Evaluation Leading to the Procedure:   ACS: Yes.    Non-ACS:    Anginal Classification: CCS IV  Anti-ischemic medical therapy: Maximal Therapy (2 or more classes of medications)  Non-Invasive Test Results: No non-invasive testing performed  Prior CABG: Previous CABG        Donna Howe [facility] 09/08/2021 1:58 PM

## 2021-09-08 NOTE — Progress Notes (Signed)
Pt c/o CP which resolved with 2 nitro SL tabs and a breathing treatment. All VS wnL and as per flow. Pt feels volume overloaded and eagerly awaits her HD treatment. NP & MD aware and concur with plan of care. Report given to HD RN [facility] @ 1725. Awaiting pick up.

## 2021-09-08 NOTE — ED Provider Notes (Signed)
Emergency Department Provider Note   I have reviewed the triage vital signs and the nursing notes.   HISTORY  Chief Complaint Chest Pain   HPI Donna Howe is a 65 y.o. female with past medical history reviewed below including severe CAD and ESRD presents the emergency department with return of chest pain.  She states starting last night she had severe central chest discomfort which reminds her of her ischemic cardiac pain.  She took nitroglycerin at home which initially improved symptoms but then pain returned.  Of note, she was admitted for NSTEMI earlier this month with left heart cath at that time.  Patient denies feeling short of breath, fever, abdominal pain, or back pain. Pain is severe and non-radiating.    Past Medical History:  Diagnosis Date   Anemia    Anxiety    Asthma    CAD (coronary artery disease)    Multivessel s/p CABG 2005, numerous PCIs since that time and documented graft disease   Carotid artery disease ([place])    R CEA   ESRD on hemodialysis ([place])    Essential hypertension    Gout    HFmrEF (heart failure with mildly reduced EF)    EF ~ 40 by LV gram at cath 08/26/2021   History of blood transfusion    Hyperlipidemia    Hypothyroidism    Myocardial infarction [facility])    PAD (peripheral artery disease) ([place])    Dr. Kellie Simmering   Pneumonia 09/2019, 11/2019   S/P angioplasty with stent- DES to mRCA and to LIMA to LAD with DES 04/09/18.   04/10/2018   SBO (small bowel obstruction) ([place]) 2011   Status post lysis of adhesions & hernia repair   Sinus bradycardia    Type 2 diabetes mellitus ([place])    Umbilical hernia     Patient Active Problem List   Diagnosis Date Noted   NSTEMI (non-ST elevated myocardial infarction) ([place]) 08/26/2021   COPD (chronic obstructive pulmonary disease) ([place]) 07/13/2021   Insulin dependent type 2 diabetes mellitus ([place])    Acute respiratory failure ([place]) 07/12/2021   Nausea 07/02/2021   Pulmonary edema 06/14/2021   Other  fluid overload 03/01/2021   Stenosis of left subclavian artery (HCC)    Non-ST elevation (NSTEMI) myocardial infarction ([place]) 01/12/2021   Leukocytosis 01/09/2021   Elevated MCV 01/09/2021   Diabetic neuropathy ([place]) 01/09/2021   COVID-19 09/30/2020   Unspecified protein-calorie malnutrition ([place]) 07/05/2020   Allergy, unspecified, initial encounter 06/30/2020   Anaphylactic shock, unspecified, initial encounter 06/30/2020   Anemia in chronic kidney disease 06/30/2020   Coagulation defect, unspecified ([place]) 06/30/2020   Diarrhea, unspecified 06/30/2020   Encounter for immunization 06/30/2020   Iron deficiency anemia, unspecified 06/30/2020   Pain, unspecified 06/30/2020   PAD (peripheral artery disease) ([place]) 06/30/2020   Pruritus, unspecified 06/30/2020   Secondary hyperparathyroidism of renal origin ([place]) 06/30/2020   ESRD (end stage renal disease) (HCC)    SOB (shortness of breath)    HFmrEF (heart failure with mildly reduced EF)    GERD (gastroesophageal reflux disease)    Acute renal failure superimposed on stage 5 chronic kidney disease, not on chronic dialysis ([place]) 06/13/2020   Hyperglycemia due to diabetes mellitus ([place]) 06/13/2020   Pneumonia 12/17/2019   Cough variant asthma with component of UACS 11/20/2019   Elevated troponin I level 10/02/2019   Chest pain 10/01/2019   Unstable angina ([place]) 04/18/2018   Hypothyroidism (acquired) 04/18/2018   S/P angioplasty with stent- DES to [facility]  and to LIMA to LAD with DES 04/09/18.   04/10/2018   Angina pectoris ([place]) 04/05/2018   Status post coronary artery stent placement    Acute coronary syndrome ([place]) 05/31/2017   Acute chest pain    History of coronary artery disease    Diabetes mellitus with ESRD (end-stage renal disease) ([place]) 10/28/2013   Hypoglycemia associated with diabetes ([place]) 10/28/2013   Thrombocytopenia, unspecified ([place]) 10/28/2013   Hypokalemia 10/27/2013   Syncope 10/26/2013   Anemia 10/26/2013   AKI  (acute kidney injury) ([place]) 10/26/2013   Fracture of toe of right foot 81/44/8185   Umbilical hernia    Carotid artery disease ([place])    Occlusion and stenosis of carotid artery without mention of cerebral infarction 04/15/2013   Hx of CABG    Ejection fraction    Atherosclerosis of native artery of extremity with intermittent claudication ([place]) 02/11/2013   Diabetes mellitus with renal manifestation ([place]) 01/21/2013   Bradycardia 01/03/2013   Chronic kidney disease (CKD), stage III (moderate) (HCC)    Gout    PROTEINURIA, MILD 01/18/2010   Obesity (BMI 30-39.9) 08/26/2009   Asthma, chronic, unspecified asthma severity, with acute exacerbation 08/26/2009   Mixed hyperlipidemia 03/31/2007   Chronic hypotension 03/31/2007   CAD (coronary artery disease) 03/31/2007    Past Surgical History:  Procedure Laterality Date   AORTIC ARCH ANGIOGRAPHY N/A 08/02/2020   Procedure: AORTIC ARCH ANGIOGRAPHY;  Surgeon: Waynetta Sandy, MD;  Location: [place] CV LAB;  Service: Cardiovascular;  Laterality: N/A;  Lt upper extermity   AV FISTULA PLACEMENT Left 06/29/2020   Procedure: LEFT ARM ARTERIOVENOUS (AV) FISTULA;  Surgeon: Waynetta Sandy, MD;  Location: [place];  Service: Vascular;  Laterality: Left;  ARM   AV FISTULA PLACEMENT Right 05/11/2021   Procedure: RIGHT BRACHIOCEPHALIC ARTERIOVENOUS (AV) FISTULA CREATION;  Surgeon: Waynetta Sandy, MD;  Location: [facility] OR;  Service: Vascular;  Laterality: Right;   [place]  2010   CORONARY ARTERY BYPASS GRAFT  2005   CORONARY BALLOON ANGIOPLASTY N/A 05/31/2017   Procedure: CORONARY BALLOON ANGIOPLASTY;  Surgeon: Jettie Booze, MD;  Location: [place] CV LAB;  Service: Cardiovascular;  Laterality: N/A;   CORONARY BALLOON ANGIOPLASTY N/A 06/14/2021   Procedure: CORONARY BALLOON ANGIOPLASTY;  Surgeon: Jettie Booze, MD;  Location: [place] CV LAB;  Service: Cardiovascular;   Laterality: N/A;   CORONARY BALLOON ANGIOPLASTY N/A 08/26/2021   Procedure: CORONARY BALLOON ANGIOPLASTY;  Surgeon: Leonie Man, MD;  Location: [place] CV LAB;  Service: Cardiovascular;  Laterality: N/A;   CORONARY STENT INTERVENTION N/A 05/31/2017   Procedure: CORONARY STENT INTERVENTION;  Surgeon: Jettie Booze, MD;  Location: [place] CV LAB;  Service: Cardiovascular;  Laterality: N/A;   CORONARY STENT INTERVENTION N/A 04/09/2018   Procedure: CORONARY STENT INTERVENTION;  Surgeon: Jettie Booze, MD;  Location: [place] CV LAB;  Service: Cardiovascular;  Laterality: N/A;  SVG RCA   CORONARY STENT INTERVENTION N/A 01/10/2021   Procedure: CORONARY STENT INTERVENTION;  Surgeon: Leonie Man, MD;  Location: [place] CV LAB;  Service: Cardiovascular;  Laterality: N/A;   CORONARY/GRAFT ACUTE MI REVASCULARIZATION N/A 02/24/2021   Procedure: Coronary/Graft Acute MI Revascularization;  Surgeon: Jettie Booze, MD;  Location: [place] [facility];  Service: Cardiovascular;  Laterality: N/A;   ENDARTERECTOMY Right 04/18/2013   Procedure: ENDARTERECTOMY CAROTID;  Surgeon: Mal Misty, MD;  Location: [place];  Service: Vascular;  Laterality: Right;   HERNIA  REPAIR  1989   Incisional hernia repair x2  03/04/2010   Laparoscopic with 35cm mesh by Dr Ronnald Collum   IR [place] CV LINE RIGHT  06/21/2020   IR US GUIDE VASC ACCESS RIGHT  06/21/2020   LEFT HEART CATH AND CORS/GRAFTS ANGIOGRAPHY N/A 05/31/2017   Procedure: LEFT HEART CATH AND CORS/GRAFTS ANGIOGRAPHY;  Surgeon: Jettie Booze, MD;  Location: [place] [facility];  Service: Cardiovascular;  Laterality: N/A;   LEFT HEART CATH AND CORS/GRAFTS ANGIOGRAPHY N/A 04/08/2018   Procedure: LEFT HEART CATH AND CORS/GRAFTS ANGIOGRAPHY;  Surgeon: Jettie Booze, MD;  Location: [place] CV LAB;  Service: Cardiovascular;  Laterality: N/A;   LEFT HEART CATH AND CORS/GRAFTS ANGIOGRAPHY N/A 06/22/2020   Procedure: LEFT  HEART CATH AND CORS/GRAFTS ANGIOGRAPHY;  Surgeon: Belva Crome, MD;  Location: [place] [facility];  Service: Cardiovascular;  Laterality: N/A;   LEFT HEART CATH AND CORS/GRAFTS ANGIOGRAPHY N/A 01/10/2021   Procedure: LEFT HEART CATH AND CORS/GRAFTS ANGIOGRAPHY;  Surgeon: Leonie Man, MD;  Location: [place] CV LAB;  Service: Cardiovascular;  Laterality: N/A;   LEFT HEART CATH AND CORS/GRAFTS ANGIOGRAPHY N/A 02/24/2021   Procedure: LEFT HEART CATH AND CORS/GRAFTS ANGIOGRAPHY;  Surgeon: Jettie Booze, MD;  Location: [place] [facility] LAB;  Service: Cardiovascular;  Laterality: N/A;   LEFT HEART CATH AND CORS/GRAFTS ANGIOGRAPHY N/A 06/14/2021   Procedure: LEFT HEART CATH AND CORS/GRAFTS ANGIOGRAPHY;  Surgeon: Jettie Booze, MD;  Location: [place] CV LAB;  Service: Cardiovascular;  Laterality: N/A;   LEFT HEART CATH AND CORS/GRAFTS ANGIOGRAPHY N/A 08/26/2021   Procedure: LEFT HEART CATH AND CORS/GRAFTS ANGIOGRAPHY;  Surgeon: Leonie Man, MD;  Location: [place] CV LAB;  Service: Cardiovascular;  Laterality: N/A;   LEFT HEART CATHETERIZATION WITH CORONARY ANGIOGRAM N/A 12/19/2012   Procedure: LEFT HEART CATHETERIZATION WITH CORONARY ANGIOGRAM;  Surgeon: Josue Hector, MD;  Location: [facility] CATH LAB;  Service: Cardiovascular;  Laterality: N/A;   LEFT HEART CATHETERIZATION WITH CORONARY/GRAFT ANGIOGRAM N/A 04/19/2013   Procedure: LEFT HEART CATHETERIZATION WITH Beatrix Fetters;  Surgeon: Lorretta Harp, MD;  Location: [facility] CATH LAB;  Service: Cardiovascular;  Laterality: N/A;   LIGATION OF ARTERIOVENOUS  FISTULA Left 09/15/2020   Procedure: LIGATION OF LEFT ARM ARTERIOVENOUS  FISTULA;  Surgeon: Waynetta Sandy, MD;  Location: [place];  Service: Vascular;  Laterality: Left;   PATCH ANGIOPLASTY Right 04/18/2013   Procedure: PATCH ANGIOPLASTY Right Internal Carotid Artery;  Surgeon: Mal Misty, MD;  Location: [place];  Service: Vascular;  Laterality: Right;   PERCUTANEOUS  CORONARY STENT INTERVENTION (PCI-S) Right 12/19/2012   Procedure: PERCUTANEOUS CORONARY STENT INTERVENTION (PCI-S);  Surgeon: Josue Hector, MD;  Location: [facility] CATH LAB;  Service: Cardiovascular;  Laterality: Right;   PERIPHERAL VASCULAR INTERVENTION Left 08/02/2020   Procedure: PERIPHERAL VASCULAR INTERVENTION;  Surgeon: Waynetta Sandy, MD;  Location: [place] CV LAB;  Service: Cardiovascular;  Laterality: Left;  Left subclavian   PERIPHERAL VASCULAR INTERVENTION  02/24/2021   Procedure: PERIPHERAL VASCULAR INTERVENTION;  Surgeon: Marty Heck, MD;  Location: [place] CV LAB;  Service: Vascular;;   SHOULDER SURGERY      Allergies Penicillins, Beta adrenergic blockers, and Brilinta [ticagrelor]  Family History  Problem Relation Age of Onset   Diabetes Mother    Heart disease Mother        before age 42   Hyperlipidemia Mother    Hypertension Mother    Thyroid disease Father  Hypertension Father    AAA (abdominal aortic aneurysm) Father    Heart disease Brother        before age 63   Hypertension Brother    Hyperlipidemia Son    Hypertension Son     Social History Social History   Tobacco Use   Smoking status: Former    Packs/day: 1.00    Years: 20.00    Pack years: 20.00    Types: Cigarettes    Quit date: 12/10/2012    Years since quitting: 8.7   Smokeless tobacco: Never   Tobacco comments:    Not currently smoking   Vaping Use   Vaping Use: Never used  Substance Use Topics   Alcohol use: No    Alcohol/week: 0.0 standard drinks   Drug use: No    Review of Systems  Constitutional: No fever/chills Eyes: No visual changes. ENT: No sore throat. Cardiovascular: Positive chest pain. Respiratory: Denies shortness of breath. Gastrointestinal: No abdominal pain.  No nausea, no vomiting.  No diarrhea.  No constipation. Genitourinary: Negative for dysuria. Musculoskeletal: Negative for back pain. Skin: Negative for rash. Neurological: Negative  for headaches, focal weakness or numbness.  10-point ROS otherwise negative.  ____________________________________________   PHYSICAL EXAM:  VITAL SIGNS: ED Triage Vitals  Enc Vitals Group     BP 09/08/21 1011 (!) 145/62     Pulse Rate 09/08/21 1011 (!) 102     Resp 09/08/21 1011 (!) 24     Temp 09/08/21 1011 99 F (37.2 C)     Temp Source 09/08/21 1011 Oral     SpO2 09/08/21 1011 95 %     Weight 09/08/21 1012 206 lb (93.4 kg)     Height 09/08/21 1012 5\' 5"  (1.651 m)    Constitutional: Alert and oriented. Appears unwell and clutching at her chest.  Eyes: Conjunctivae are normal.  Head: Atraumatic. Nose: No congestion/rhinnorhea. Mouth/Throat: Mucous membranes are moist.  Neck: No stridor.   Cardiovascular: Tachycardia. Good peripheral circulation. Grossly normal heart sounds. Well appearing HD cath in the left chest wall.  Respiratory: Normal respiratory effort.  No retractions. Lungs CTAB. Gastrointestinal: Soft and nontender. No distention.  Musculoskeletal: No lower extremity tenderness nor edema. No gross deformities of extremities. Neurologic:  Normal speech and language. No gross focal neurologic deficits are appreciated.  Skin:  Skin is warm, dry and intact. No rash noted.   ____________________________________________   LABS (all labs ordered are listed, but only abnormal results are displayed)  Labs Reviewed  BASIC METABOLIC PANEL - Abnormal; Notable for the following components:      Result Value   Sodium 134 (*)    Chloride 97 (*)    CO2 19 (*)    Glucose, Bld 246 (*)    BUN 40 (*)    Creatinine, Ser 6.29 (*)    GFR, Estimated 7 (*)    Anion gap 18 (*)    All other components within normal limits  CBC - Abnormal; Notable for the following components:   WBC 14.1 (*)    RBC 3.11 (*)    Hemoglobin 10.7 (*)    HCT 34.0 (*)    MCV 109.3 (*)    MCH 34.4 (*)    All other components within normal limits  TROPONIN I (HIGH SENSITIVITY) - Abnormal;  Notable for the following components:   Troponin I (High Sensitivity) 134 (*)    All other components within normal limits  TROPONIN I (HIGH SENSITIVITY) - Abnormal; Notable for the  following components:   Troponin I (High Sensitivity) 173 (*)    All other components within normal limits  RESP PANEL BY RT-PCR (FLU A&B, COVID) ARPGX2  HEPARIN LEVEL (UNFRACTIONATED)   ____________________________________________  EKG   EKG Interpretation  Date/Time:  Thursday September 08 2021 10:12:57 EST Ventricular Rate:  94 PR Interval:  199 QRS Duration: 111 QT Interval:  373 QTC Calculation: 467 R Axis:   19 Text Interpretation: Sinus rhythm Repol abnrm, severe global ischemia (LM/MVD) Baseline wander in lead(s) II III aVF V2 Confirmed by Nanda Quinton [phone]) on 09/08/2021 11:26:50 AM        ____________________________________________  RADIOLOGY  DG Chest Port 1 View  Result Date: 09/08/2021 CLINICAL DATA:  Chest pain EXAM: PORTABLE CHEST 1 VIEW COMPARISON:  08/26/2021 FINDINGS: Mild cardiac enlargement. Negative for heart failure. Postop median sternotomy. Stent in the proximal left carotid or subclavian artery. Right jugular sent venous catheter tip in the right atrium unchanged. No pneumothorax Negative for edema.  No infiltrate or effusion. IMPRESSION: No acute abnormality. Improvement in vascular congestion and interstitial edema. Electronically Signed   By: Franchot Gallo M.D.   On: 09/08/2021 11:04    ____________________________________________   PROCEDURES  Procedure(s) performed:   Procedures  CRITICAL CARE Performed by: Margette Fast Total critical care time: 35 minutes Critical care time was exclusive of separately billable procedures and treating other patients. Critical care was necessary to treat or prevent imminent or life-threatening deterioration. Critical care was time spent personally by me on the following activities: development of treatment plan with  patient and/or surrogate as well as nursing, discussions with consultants, evaluation of patient's response to treatment, examination of patient, obtaining history from patient or surrogate, ordering and performing treatments and interventions, ordering and review of laboratory studies, ordering and review of radiographic studies, pulse oximetry and re-evaluation of patient's condition.  Nanda Quinton, MD Emergency Medicine  ____________________________________________   INITIAL IMPRESSION / ASSESSMENT AND PLAN / ED COURSE  Pertinent labs & imaging results that were available during my care of the patient were reviewed by me and considered in my medical decision making (see chart for details).   Patient arrives to the emergency department with chest pain.  She appears to be in acute distress.  Patient given nitroglycerin.  Her EKG appears globally ischemic but does not appear to meet STEMI criteria.  I did review the tracing immediately with Dr. Harrington Challenger with cardiology.  They will be down to consult.  Reviewed the most recent left heart cath from earlier this month along with recommendations from that event.  Additional nitroglycerin given here and have started heparin.   Surprising night, the patient's troponin here is elevated although mildly so.  Her labs show chronic renal failure but normal potassium.  COVID and flu are negative.  Vascular congestion and edema improved on chest x-ray when compared to prior.  Plan is for cardiology admission down to [facility] where additional intervention can be considered.   Discussed patient's case with Cardiology to request admission. Patient and family (if present) updated with plan. Care transferred to Cardiology service.  I reviewed all nursing notes, vitals, pertinent old records, EKGs, labs, imaging (as available).    ____________________________________________  FINAL CLINICAL IMPRESSION(S) / ED DIAGNOSES  Final diagnoses:  Unstable angina (HCC)     MEDICATIONS GIVEN DURING THIS VISIT:  Medications  nitroGLYCERIN (NITROSTAT) SL tablet 0.4 mg (0.4 mg Sublingual Given 09/08/21 1106)  heparin bolus via infusion 4,000 Units (4,000 Units Intravenous Bolus from  Bag 09/08/21 1101)    Followed by  heparin ADULT infusion 100 units/mL (25000 units/222mL) (1,000 Units/hr Intravenous New Bag/Given 09/08/21 1100)  Chlorhexidine Gluconate Cloth 2 % PADS 6 each (has no administration in time range)  Darbepoetin Alfa (ARANESP) injection 40 mcg (has no administration in time range)  morphine 4 MG/ML injection 2 mg (2 mg Intravenous Not Given 09/08/21 1145)  HYDROmorphone (DILAUDID) injection 0.5 mg (0.5 mg Intravenous Given 09/08/21 1213)     Note:  This document was prepared using Dragon voice recognition software and may include unintentional dictation errors.  Nanda Quinton, MD, [facility] Emergency Medicine    Teena Mangus, [facility], MD 09/08/21 1352

## 2021-09-08 NOTE — Progress Notes (Signed)
Staff tried to give report to 6e RN, Lennette Bihari, who asked that we call back in 10 minutes because he was in the process of admitting a new patient.   Patient was moved to cath lab holding area on a 6E bed.   Will attempt to give report in 50mins, per RN request

## 2021-09-08 NOTE — Progress Notes (Signed)
Pt received from cath lab AxOx4. VS wnL and as per flow. Pt NSR on telemetry. (R) groin C/D/I level 0, no adverse effects. Sequence vitals started. Pt ordering dinner. Call bell placed within reach. Will continue to monitor and maintain safety.

## 2021-09-09 ENCOUNTER — Encounter (HOSPITAL_COMMUNITY)
Admission: EM | Disposition: A | Discharge status: Hospice | Payer: Self-pay | Source: Home / Self Care | Attending: Internal Medicine

## 2021-09-09 ENCOUNTER — Encounter (HOSPITAL_COMMUNITY): Payer: Self-pay | Admitting: Internal Medicine

## 2021-09-09 DIAGNOSIS — I200 Unstable angina: Secondary | ICD-10-CM

## 2021-09-09 DIAGNOSIS — I25110 Atherosclerotic heart disease of native coronary artery with unstable angina pectoris: Secondary | ICD-10-CM | POA: Diagnosis not present

## 2021-09-09 HISTORY — PX: CORONARY BALLOON ANGIOPLASTY: CATH118233

## 2021-09-09 LAB — RENAL FUNCTION PANEL
Albumin: 3.1 g/dL — ABNORMAL LOW (ref 3.5–5.0)
Anion gap: 11 (ref 5–15)
BUN: 29 mg/dL — ABNORMAL HIGH (ref 8–23)
CO2: 25 mmol/L (ref 22–32)
Calcium: 8.5 mg/dL — ABNORMAL LOW (ref 8.9–10.3)
Chloride: 95 mmol/L — ABNORMAL LOW (ref 98–111)
Creatinine, Ser: 5.42 mg/dL — ABNORMAL HIGH (ref 0.44–1.00)
GFR, Estimated: 8 mL/min — ABNORMAL LOW (ref >60)
Glucose, Bld: 244 mg/dL — ABNORMAL HIGH (ref 70–99)
Phosphorus: 5.9 mg/dL — ABNORMAL HIGH (ref 2.5–4.6)
Potassium: 4.1 mmol/L (ref 3.5–5.1)
Sodium: 131 mmol/L — ABNORMAL LOW (ref 135–145)

## 2021-09-09 LAB — COMPREHENSIVE METABOLIC PANEL
ALT: 45 U/L — ABNORMAL HIGH (ref 0–44)
AST: 85 U/L — ABNORMAL HIGH (ref 15–41)
Albumin: 3.6 g/dL (ref 3.5–5.0)
Alkaline Phosphatase: 78 U/L (ref 38–126)
Anion gap: 11 (ref 5–15)
BUN: 20 mg/dL (ref 8–23)
CO2: 27 mmol/L (ref 22–32)
Calcium: 8.6 mg/dL — ABNORMAL LOW (ref 8.9–10.3)
Chloride: 96 mmol/L — ABNORMAL LOW (ref 98–111)
Creatinine, Ser: 4.31 mg/dL — ABNORMAL HIGH (ref 0.44–1.00)
GFR, Estimated: 11 mL/min — ABNORMAL LOW (ref >60)
Glucose, Bld: 167 mg/dL — ABNORMAL HIGH (ref 70–99)
Potassium: 3.9 mmol/L (ref 3.5–5.1)
Sodium: 134 mmol/L — ABNORMAL LOW (ref 135–145)
Total Bilirubin: 0.9 mg/dL (ref 0.3–1.2)
Total Protein: 6.2 g/dL — ABNORMAL LOW (ref 6.5–8.1)

## 2021-09-09 LAB — CBC
HCT: 29.2 % — ABNORMAL LOW (ref 36.0–46.0)
Hemoglobin: 9.2 g/dL — ABNORMAL LOW (ref 12.0–15.0)
MCH: 34.1 pg — ABNORMAL HIGH (ref 26.0–34.0)
MCHC: 31.5 g/dL (ref 30.0–36.0)
MCV: 108.1 fL — ABNORMAL HIGH (ref 80.0–100.0)
Platelets: 170 10*3/uL (ref 150–400)
RBC: 2.7 MIL/uL — ABNORMAL LOW (ref 3.87–5.11)
RDW: 14.6 % (ref 11.5–15.5)
WBC: 10 10*3/uL (ref 4.0–10.5)
nRBC: 0 % (ref 0.0–0.2)

## 2021-09-09 LAB — TROPONIN I (HIGH SENSITIVITY)
Troponin I (High Sensitivity): 1641 ng/L (ref <18)
Troponin I (High Sensitivity): 2762 ng/L (ref <18)
Troponin I (High Sensitivity): 7276 ng/L (ref <18)

## 2021-09-09 LAB — GLUCOSE, CAPILLARY
Glucose-Capillary: 151 mg/dL — ABNORMAL HIGH (ref 70–99)
Glucose-Capillary: 205 mg/dL — ABNORMAL HIGH (ref 70–99)

## 2021-09-09 LAB — POCT ACTIVATED CLOTTING TIME
Activated Clotting Time: 426 seconds
Activated Clotting Time: 197 seconds
Activated Clotting Time: 311 seconds
Activated Clotting Time: 167 seconds

## 2021-09-09 LAB — BRAIN NATRIURETIC PEPTIDE: B Natriuretic Peptide: 3297 pg/mL — ABNORMAL HIGH (ref 0.0–100.0)

## 2021-09-09 LAB — HEPARIN LEVEL (UNFRACTIONATED): Heparin Unfractionated: <0.1 IU/mL — ABNORMAL LOW (ref 0.30–0.70)

## 2021-09-09 SURGERY — CORONARY BALLOON ANGIOPLASTY
Anesthesia: LOCAL

## 2021-09-09 MED ORDER — SODIUM CHLORIDE 0.9 % IV SOLN
INTRAVENOUS | Status: AC | PRN
  Administered 2021-09-09: 10 mL/h via INTRAVENOUS

## 2021-09-09 MED ORDER — NITROGLYCERIN 1 MG/10 ML FOR IR/CATH LAB
INTRA_ARTERIAL | Status: AC
  Filled 2021-09-09: qty 10

## 2021-09-09 MED ORDER — SODIUM CHLORIDE 0.9 % IV SOLN: 250.0000 mL | INTRAVENOUS | Status: DC | PRN

## 2021-09-09 MED ORDER — HEPARIN SODIUM (PORCINE) 1000 UNIT/ML IJ SOLN
INTRAMUSCULAR | Status: DC | PRN
  Administered 2021-09-09: 8000 [IU] via INTRAVENOUS

## 2021-09-09 MED ORDER — LIDOCAINE HCL (PF) 1 % IJ SOLN
INTRAMUSCULAR | Status: DC | PRN
  Administered 2021-09-09: 10 mL via INTRADERMAL

## 2021-09-09 MED ORDER — ATROPINE SULFATE 1 MG/10ML IJ SOSY
PREFILLED_SYRINGE | INTRAMUSCULAR | Status: AC
  Filled 2021-09-09: qty 10

## 2021-09-09 MED ORDER — HEPARIN (PORCINE) IN NACL 1000-0.9 UT/500ML-% IV SOLN
INTRAVENOUS | Status: AC
  Filled 2021-09-09: qty 1000

## 2021-09-09 MED ORDER — SODIUM CHLORIDE 0.9% FLUSH: 3.0000 mL | Freq: Two times a day (BID) | INTRAVENOUS | Status: DC

## 2021-09-09 MED ORDER — FENTANYL CITRATE (PF) 100 MCG/2ML IJ SOLN
INTRAMUSCULAR | Status: AC
  Filled 2021-09-09: qty 2

## 2021-09-09 MED ORDER — SODIUM CHLORIDE 0.9% FLUSH: 3.0000 mL | INTRAVENOUS | Status: DC | PRN

## 2021-09-09 MED ORDER — MIDAZOLAM HCL 2 MG/2ML IJ SOLN
INTRAMUSCULAR | Status: DC | PRN
  Administered 2021-09-09: 1 mg via INTRAVENOUS

## 2021-09-09 MED ORDER — HYDROMORPHONE HCL 1 MG/ML IJ SOLN
1.0000 mg | INTRAMUSCULAR | Status: DC | PRN
  Administered 2021-09-09 – 2021-09-16 (×16): 1 mg via INTRAVENOUS
  Filled 2021-09-09 (×17): qty 1

## 2021-09-09 MED ORDER — LIDOCAINE HCL (PF) 1 % IJ SOLN
INTRAMUSCULAR | Status: AC
  Filled 2021-09-09: qty 30

## 2021-09-09 MED ORDER — SODIUM CHLORIDE 0.9 % IV SOLN: INTRAVENOUS | Status: DC

## 2021-09-09 MED ORDER — NITROGLYCERIN 1 MG/10 ML FOR IR/CATH LAB
INTRA_ARTERIAL | Status: DC | PRN
  Administered 2021-09-09 (×2): 200 ug via INTRACORONARY

## 2021-09-09 MED ORDER — MIDAZOLAM HCL 2 MG/2ML IJ SOLN
INTRAMUSCULAR | Status: AC
  Filled 2021-09-09: qty 2

## 2021-09-09 MED ORDER — MORPHINE SULFATE (PF) 2 MG/ML IV SOLN
2.0000 mg | INTRAVENOUS | Status: DC | PRN
  Filled 2021-09-09: qty 1

## 2021-09-09 MED ORDER — NITROGLYCERIN IN D5W 200-5 MCG/ML-% IV SOLN: 0.0000 ug/min | INTRAVENOUS | Status: DC

## 2021-09-09 MED ORDER — FENTANYL CITRATE (PF) 100 MCG/2ML IJ SOLN
INTRAMUSCULAR | Status: DC | PRN
  Administered 2021-09-09 (×2): 50 ug via INTRAVENOUS

## 2021-09-09 MED ORDER — HEPARIN (PORCINE) IN NACL 1000-0.9 UT/500ML-% IV SOLN
INTRAVENOUS | Status: DC | PRN
  Administered 2021-09-09 (×3): 500 mL

## 2021-09-09 MED ORDER — HEPARIN SODIUM (PORCINE) 1000 UNIT/ML IJ SOLN
INTRAMUSCULAR | Status: AC
  Filled 2021-09-09: qty 10

## 2021-09-09 MED ORDER — HEPARIN SODIUM (PORCINE) 5000 UNIT/ML IJ SOLN
5000.0000 [IU] | Freq: Three times a day (TID) | INTRAMUSCULAR | Status: DC
  Administered 2021-09-10 – 2021-09-17 (×19): 5000 [IU] via SUBCUTANEOUS
  Filled 2021-09-09 (×18): qty 1

## 2021-09-09 MED ORDER — INSULIN ASPART 100 UNIT/ML IJ SOLN
0.0000 [IU] | Freq: Three times a day (TID) | INTRAMUSCULAR | Status: DC
  Administered 2021-09-09 – 2021-09-10 (×2): 5 [IU] via SUBCUTANEOUS
  Administered 2021-09-10: 17:00:00 3 [IU] via SUBCUTANEOUS
  Administered 2021-09-10: 07:00:00 5 [IU] via SUBCUTANEOUS
  Administered 2021-09-11 – 2021-09-12 (×3): 8 [IU] via SUBCUTANEOUS
  Administered 2021-09-13: 14:00:00 2 [IU] via SUBCUTANEOUS
  Administered 2021-09-13: 10:00:00 3 [IU] via SUBCUTANEOUS
  Administered 2021-09-14 (×3): 2 [IU] via SUBCUTANEOUS
  Administered 2021-09-15 – 2021-09-16 (×6): 3 [IU] via SUBCUTANEOUS
  Administered 2021-09-17: 8 [IU] via SUBCUTANEOUS
  Administered 2021-09-17: 2 [IU] via SUBCUTANEOUS

## 2021-09-09 MED ORDER — HYDRALAZINE HCL 20 MG/ML IJ SOLN: 10.0000 mg | INTRAMUSCULAR | Status: AC | PRN

## 2021-09-09 SURGICAL SUPPLY — 17 items
BALLN SAPPHIRE ~~LOC~~ 3.5X8 (BALLOONS) ×1 IMPLANT
CATH SHOCKWAVE 3.0X12 (CATHETERS) IMPLANT
CATH VISTA GUIDE 6FR XBLAD4 (CATHETERS) ×1 IMPLANT
CATHETER SHOCKWAVE 3.0X12 (CATHETERS) ×2
ELECT DEFIB PAD ADLT CADENCE (PAD) ×1 IMPLANT
GLIDESHEATH SLEND SS 6F .021 (SHEATH) IMPLANT
GUIDEWIRE INQWIRE 1.5J.035X260 (WIRE) IMPLANT
INQWIRE 1.5J .035X260CM (WIRE)
KIT ENCORE 26 ADVANTAGE (KITS) ×1 IMPLANT
KIT ESSENTIALS PG (KITS) ×1 IMPLANT
KIT HEART LEFT (KITS) ×2 IMPLANT
PACK CARDIAC CATHETERIZATION (CUSTOM PROCEDURE TRAY) ×2 IMPLANT
SHEATH PINNACLE 6F 10CM (SHEATH) ×1 IMPLANT
TRANSDUCER W/STOPCOCK (MISCELLANEOUS) ×2 IMPLANT
TUBING CIL FLEX 10 FLL-RA (TUBING) ×2 IMPLANT
WIRE ASAHI PROWATER 180CM (WIRE) ×1 IMPLANT
WIRE EMERALD 3MM-J .035X150CM (WIRE) ×1 IMPLANT

## 2021-09-09 NOTE — Progress Notes (Signed)
Pt receives out-pt HD at [facility] on TTS. Pt arrives at 11:15 for 11:30 chair time. Will assist as needed.   Melven Sartorius Renal Navigator [phone]

## 2021-09-09 NOTE — H&P (View-Only) (Signed)
Progress Note  Patient Name: Donna Howe Date of Encounter: 09/09/2021  [facility] Cardiologist: Rozann Lesches, MD   Subjective   Mrs Iott had chest pain during IHD yesterday. She states she feels better today.   She underwent LHC yesterday, showed improved result from angioplasty of LM-Lcx. She has known occluded grafts. With persistent chest pain and last EKG shows diffuse ST depression with mild STE in aVR  Inpatient Medications    Scheduled Meds:  allopurinol  100 mg Oral Q T,Th,Sa-HD   ALPRAZolam  0.25 mg Oral q morning   ALPRAZolam  0.5 mg Oral QHS   aspirin EC  81 mg Oral Daily   atorvastatin  80 mg Oral QHS   Chlorhexidine Gluconate Cloth  6 each Topical Q0600   clopidogrel  75 mg Oral Daily   darbepoetin (ARANESP) injection - DIALYSIS  40 mcg Intravenous Q Thu-HD   ezetimibe  10 mg Oral QHS   ferric citrate  420 mg Oral TID WC   fluticasone furoate-vilanterol  1 puff Inhalation Daily   gabapentin  400 mg Oral QHS   heparin sodium (porcine)       insulin aspart protamine- aspart  24 Units Subcutaneous BID WC   levothyroxine  125 mcg Oral QAC breakfast   midodrine  10 mg Oral BID WC   morphine  2 mg Intravenous Once   pantoprazole  40 mg Oral BID   ranolazine  500 mg Oral QHS   sodium chloride flush  3 mL Intravenous Q12H   sodium chloride flush  3 mL Intravenous Q12H   Continuous Infusions:  sodium chloride     sodium chloride     sodium chloride     heparin 1,000 Units/hr (09/09/21 0600)   nitroGLYCERIN Stopped (09/09/21 0215)   tirofiban 0.075 mcg/kg/min (09/09/21 0600)   PRN Meds: sodium chloride, sodium chloride, acetaminophen, albuterol, HYDROmorphone (DILAUDID) injection, ipratropium, nitroGLYCERIN, ondansetron (ZOFRAN) IV, ondansetron, sodium chloride flush, sodium chloride flush   Vital Signs    Vitals:   09/09/21 0600 09/09/21 0615 09/09/21 0630 09/09/21 0700  BP: (!) 98/56   91/68  Pulse: 75 80 (!) 127 78  Resp: 17 19 17  (!)  21  Temp:    98.2 F (36.8 C)  TempSrc:    Axillary  SpO2: 98% (!) 84% 96% 95%  Weight:      Height:        Intake/Output Summary (Last 24 hours) at 09/09/2021 0852 Last data filed at 09/09/2021 0600 Gross per 24 hour  Intake 290.42 ml  Output 401 ml  Net -110.58 ml   Last 3 Weights 09/09/2021 09/08/2021 09/08/2021  Weight (lbs) 210 lb 1.6 oz 214 lb 1.1 oz 214 lb 1.1 oz  Weight (kg) 95.3 kg 97.1 kg 97.1 kg      Telemetry    NSR, diffuse ST depression - Personally Reviewed  ECG    NSR, diffuse ST depression with mild STE aVR - Personally Reviewed  Physical Exam   Vitals:   09/09/21 0630 09/09/21 0700  BP:  91/68  Pulse: (!) 127 78  Resp: 17 (!) 21  Temp:  98.2 F (36.8 C)  SpO2: 96% 95%    GEN: sitting up, no distress  HEENT: moist mucous membranes Cardiac: RRR, no murmurs, rubs, or gallops.  Respiratory: Nl wob, Clear to auscultation bilaterally. GI: Soft, nontender, non-distended  MS: No edema; No deformity. Neuro:  Nonfocal  Psych: Normal affect   Labs    High Sensitivity Troponin:  Recent Labs  Lab 08/27/21 0347 09/08/21 1034 09/08/21 1210 09/09/21 0251 09/09/21 0631  TROPONINIHS >24,000* 134* 173* 1,641* 2,762*     Chemistry Recent Labs  Lab 09/08/21 1058 09/09/21 0251  NA 134* 134*  K 4.2 3.9  CL 97* 96*  CO2 19* 27  GLUCOSE 246* 167*  BUN 40* 20  CREATININE 6.29* 4.31*  CALCIUM 9.7 8.6*  PROT  --  6.2*  ALBUMIN  --  3.6  AST  --  85*  ALT  --  45*  ALKPHOS  --  78  BILITOT  --  0.9  GFRNONAA 7* 11*  ANIONGAP 18* 11    Lipids No results for input(s): CHOL, TRIG, HDL, LABVLDL, LDLCALC, CHOLHDL in the last 168 hours.  Hematology Recent Labs  Lab 09/08/21 1058 09/09/21 0251  WBC 14.1* 10.0  RBC 3.11* 2.70*  HGB 10.7* 9.2*  HCT 34.0* 29.2*  MCV 109.3* 108.1*  MCH 34.4* 34.1*  MCHC 31.5 31.5  RDW 14.6 14.6  PLT 220 170   Thyroid No results for input(s): TSH, FREET4 in the last 168 hours.  BNP Recent Labs  Lab  09/09/21 0251  BNP 3,297.0*    DDimer No results for input(s): DDIMER in the last 168 hours.   Radiology    CARDIAC CATHETERIZATION  Result Date: 09/09/2021   Ost LM to Dist LM lesion is 50% stenosed.   Prox LAD to Mid LAD lesion is 100% stenosed.   Ost LAD to Prox LAD lesion is 100% stenosed.   Ost Cx to Mid Cx lesion is 40% stenosed.   Mid RCA to Dist RCA lesion is 30% stenosed.   Prox RCA lesion is 100% stenosed.   Origin lesion is 20% stenosed.   Mid Graft lesion is 15% stenosed.   Prox Graft-1 lesion is 15% stenosed.   Origin to Prox Graft lesion before 1st Mrg  is 100% stenosed.   Origin to Prox Graft lesion is 35% stenosed.   1st Mrg-1 lesion is 99% stenosed.   1st Mrg-2 lesion is 75% stenosed.   LPAV lesion is 80% stenosed.   Non-stenotic Prox Graft-2 lesion was previously treated.   SVG.   SVG due to known occlusion.   LIMA and is normal in caliber.   The graft exhibits moderate focal disease.   LV end diastolic pressure is normal. Severe 3 vessel obstructive CAD. Co dominant circulation.  The prior angioplasty site of the left main and LCx is improved with 50% residual. This was 90% before. Patent LIMA to the LAD. 30% in stent disease in the ostial LIMA Occluded SVG sequential to the OM1 and left PDA- chronic Patent SVG to the RCA. Stents with mild nonobstructive disease. Patent stent in the left subclavian. There is a 20 mm Hg pressure gradient. But the stent appears to be widely patent. Normal LVEDP Plan: would recommend continued medical therapy. There are no suitable targets for PCI and prior PCI sites are patent  DG Chest Port 1 View  Result Date: 09/08/2021 CLINICAL DATA:  Chest pain EXAM: PORTABLE CHEST 1 VIEW COMPARISON:  08/26/2021 FINDINGS: Mild cardiac enlargement. Negative for heart failure. Postop median sternotomy. Stent in the proximal left carotid or subclavian artery. Right jugular sent venous catheter tip in the right atrium unchanged. No pneumothorax Negative for edema.   No infiltrate or effusion. IMPRESSION: No acute abnormality. Improvement in vascular congestion and interstitial edema. Electronically Signed   By: Franchot Gallo M.D.   On: 09/08/2021 11:04  Cardiac Studies   LHC 09/08/2021  Ost LM to Dist LM lesion is 50% stenosed.   Prox LAD to Mid LAD lesion is 100% stenosed.   Ost LAD to Prox LAD lesion is 100% stenosed.   Ost Cx to Mid Cx lesion is 40% stenosed.   Mid RCA to Dist RCA lesion is 30% stenosed.   Prox RCA lesion is 100% stenosed.   Origin lesion is 20% stenosed.   Mid Graft lesion is 15% stenosed.   Prox Graft-1 lesion is 15% stenosed.   Origin to Prox Graft lesion before 1st Mrg  is 100% stenosed.   Origin to Prox Graft lesion is 35% stenosed.   1st Mrg-1 lesion is 99% stenosed.   1st Mrg-2 lesion is 75% stenosed.   LPAV lesion is 80% stenosed.   Non-stenotic Prox Graft-2 lesion was previously treated.   SVG.   SVG due to known occlusion.   LIMA and is normal in caliber.   The graft exhibits moderate focal disease.   LV end diastolic pressure is normal.   Severe 3 vessel obstructive CAD. Co dominant circulation.  The prior angioplasty site of the left main and LCx is improved with 50% residual. This was 90% before.  Patent LIMA to the LAD. 30% in stent disease in the ostial LIMA Occluded SVG sequential to the OM1 and left PDA- chronic Patent SVG to the RCA. Stents with mild nonobstructive disease. Patent stent in the left subclavian. There is a 20 mm Hg pressure gradient. But the stent appears to be widely patent.  Normal LVEDP   Plan: would recommend continued medical therapy. There are no suitable targets for PCI and prior PCI sites are patent  Patient Profile     65 year old female with extensive revascularization history (obstructive RCA with diseased SVG, Obstructive LM with LIMA --> LAD and prior subclavian stent, occluded SVG --> OM1, LM 99% s/p POBA of ISR 08/26/21 with relook today for refractory angina with no  intervention) now with recurrent chest pain, s/p LHC 09/08/21 with chest pain recurrent and diffuse ST depression, interventional now discussing taking her back to the lab  Assessment & Plan    Complex CAD per above: She is s/p CABG in 2005 with multiple PCI's since and most recent being an NSTEMI in 08/2021 with cath showing 90-95% ISR of LM/LCx stent and treated with angioplasty and reduced to 50%. - Will plan for repeat catheterization today -NPO, plan for this afternoon (had small proportion of some oatmeal and eggs) - Continue ASA, Plavix, Ranexa and statin therapy. Previously intolerant to beta-blockers due to hypotension.  3. Hypotension: -  Usually occurs in the setting of HD. PTA Midodrine 10mg  BID has been ordered.    4. HLD: - LDL was at 21 on 08/27/2021. She remains on Atorvastatin 80mg  daily and Zetia 10mg  daily.    5. Type 2 DM: - Will continue her PTA Novolin 24U BID and order SSI coverage.    6. ESRD : - She usually has HD on T/TH/SAT schedule.  - Nephrology following  For questions or updates, please contact [place] Please consult [email or web address] for contact info under        Signed, Janina Mayo, MD  09/09/2021, 8:52 AM

## 2021-09-09 NOTE — Progress Notes (Signed)
Patient continues to complain of chest pain post PCI. Ecg shows ST depression in the inferolateral leads. BP is stable.   I think her ongoing pain and Ecg changes are related to occlusion of the first OM. This is not salvageable. The left main and LCx proper look much better.   I think we are just going to need to weather this. Ultimately she will be better post PCI but I expect her to have a NSTEMI related to OM occlusion  Will support with IV Ntg and prn Dilaudid (intolerant of MSO4)  Check serial troponin and Ecg  Huda Petrey Martinique MD, [facility] 09/09/2021 2:31 PM

## 2021-09-09 NOTE — Plan of Care (Signed)
Pt transferred to [place] this shift from HD treatment after complaints of chest pain and hypotension with heart cath completed earlier in the day.  Pt connected to monitor, cath site assessed as level zero.  Pt started on nitro, heparin, and aggrastat gtts per order.   Problem: Clinical Measurements: Goal: Ability to maintain clinical measurements within normal limits will improve Outcome: Progressing Goal: Will remain free from infection Outcome: Progressing Goal: Diagnostic test results will improve Outcome: Progressing Goal: Respiratory complications will improve Outcome: Progressing Goal: Cardiovascular complication will be avoided Outcome: Progressing

## 2021-09-09 NOTE — Progress Notes (Signed)
IV access obtained, 2 attempts, ultrasound used, 20ga in left upper arm by Z. Sharol Roussel

## 2021-09-09 NOTE — Progress Notes (Signed)
Pulled right femoral arterial sheath per MD order today at 6:59pm. Held pressure at point of maximal impulse for 25 minutes. Cleaned and applied gauze and Tegaderm dressing. No bleeding or hematoma. Site level zero. Distal pulse palpable. Pt tolerated procedure well. Pt. Will remain on bed rest for ordered amount of time. Night shift RN aware.

## 2021-09-09 NOTE — Progress Notes (Signed)
Progress Note  Patient Name: Donna Howe Date of Encounter: 09/09/2021  [facility] Cardiologist: Rozann Lesches, MD   Subjective   Mrs Navarez had chest pain during IHD yesterday. She states she feels better today.   She underwent LHC yesterday, showed improved result from angioplasty of LM-Lcx. She has known occluded grafts. With persistent chest pain and last EKG shows diffuse ST depression with mild STE in aVR  Inpatient Medications    Scheduled Meds:  allopurinol  100 mg Oral Q T,Th,Sa-HD   ALPRAZolam  0.25 mg Oral q morning   ALPRAZolam  0.5 mg Oral QHS   aspirin EC  81 mg Oral Daily   atorvastatin  80 mg Oral QHS   Chlorhexidine Gluconate Cloth  6 each Topical Q0600   clopidogrel  75 mg Oral Daily   darbepoetin (ARANESP) injection - DIALYSIS  40 mcg Intravenous Q Thu-HD   ezetimibe  10 mg Oral QHS   ferric citrate  420 mg Oral TID WC   fluticasone furoate-vilanterol  1 puff Inhalation Daily   gabapentin  400 mg Oral QHS   heparin sodium (porcine)       insulin aspart protamine- aspart  24 Units Subcutaneous BID WC   levothyroxine  125 mcg Oral QAC breakfast   midodrine  10 mg Oral BID WC   morphine  2 mg Intravenous Once   pantoprazole  40 mg Oral BID   ranolazine  500 mg Oral QHS   sodium chloride flush  3 mL Intravenous Q12H   sodium chloride flush  3 mL Intravenous Q12H   Continuous Infusions:  sodium chloride     sodium chloride     sodium chloride     heparin 1,000 Units/hr (09/09/21 0600)   nitroGLYCERIN Stopped (09/09/21 0215)   tirofiban 0.075 mcg/kg/min (09/09/21 0600)   PRN Meds: sodium chloride, sodium chloride, acetaminophen, albuterol, HYDROmorphone (DILAUDID) injection, ipratropium, nitroGLYCERIN, ondansetron (ZOFRAN) IV, ondansetron, sodium chloride flush, sodium chloride flush   Vital Signs    Vitals:   09/09/21 0600 09/09/21 0615 09/09/21 0630 09/09/21 0700  BP: (!) 98/56   91/68  Pulse: 75 80 (!) 127 78  Resp: 17 19 17  (!)  21  Temp:    98.2 F (36.8 C)  TempSrc:    Axillary  SpO2: 98% (!) 84% 96% 95%  Weight:      Height:        Intake/Output Summary (Last 24 hours) at 09/09/2021 0852 Last data filed at 09/09/2021 0600 Gross per 24 hour  Intake 290.42 ml  Output 401 ml  Net -110.58 ml   Last 3 Weights 09/09/2021 09/08/2021 09/08/2021  Weight (lbs) 210 lb 1.6 oz 214 lb 1.1 oz 214 lb 1.1 oz  Weight (kg) 95.3 kg 97.1 kg 97.1 kg      Telemetry    NSR, diffuse ST depression - Personally Reviewed  ECG    NSR, diffuse ST depression with mild STE aVR - Personally Reviewed  Physical Exam   Vitals:   09/09/21 0630 09/09/21 0700  BP:  91/68  Pulse: (!) 127 78  Resp: 17 (!) 21  Temp:  98.2 F (36.8 C)  SpO2: 96% 95%    GEN: sitting up, no distress  HEENT: moist mucous membranes Cardiac: RRR, no murmurs, rubs, or gallops.  Respiratory: Nl wob, Clear to auscultation bilaterally. GI: Soft, nontender, non-distended  MS: No edema; No deformity. Neuro:  Nonfocal  Psych: Normal affect   Labs    High Sensitivity Troponin:  Recent Labs  Lab 08/27/21 0347 09/08/21 1034 09/08/21 1210 09/09/21 0251 09/09/21 0631  TROPONINIHS >24,000* 134* 173* 1,641* 2,762*     Chemistry Recent Labs  Lab 09/08/21 1058 09/09/21 0251  NA 134* 134*  K 4.2 3.9  CL 97* 96*  CO2 19* 27  GLUCOSE 246* 167*  BUN 40* 20  CREATININE 6.29* 4.31*  CALCIUM 9.7 8.6*  PROT  --  6.2*  ALBUMIN  --  3.6  AST  --  85*  ALT  --  45*  ALKPHOS  --  78  BILITOT  --  0.9  GFRNONAA 7* 11*  ANIONGAP 18* 11    Lipids No results for input(s): CHOL, TRIG, HDL, LABVLDL, LDLCALC, CHOLHDL in the last 168 hours.  Hematology Recent Labs  Lab 09/08/21 1058 09/09/21 0251  WBC 14.1* 10.0  RBC 3.11* 2.70*  HGB 10.7* 9.2*  HCT 34.0* 29.2*  MCV 109.3* 108.1*  MCH 34.4* 34.1*  MCHC 31.5 31.5  RDW 14.6 14.6  PLT 220 170   Thyroid No results for input(s): TSH, FREET4 in the last 168 hours.  BNP Recent Labs  Lab  09/09/21 0251  BNP 3,297.0*    DDimer No results for input(s): DDIMER in the last 168 hours.   Radiology    CARDIAC CATHETERIZATION  Result Date: 09/09/2021   Ost LM to Dist LM lesion is 50% stenosed.   Prox LAD to Mid LAD lesion is 100% stenosed.   Ost LAD to Prox LAD lesion is 100% stenosed.   Ost Cx to Mid Cx lesion is 40% stenosed.   Mid RCA to Dist RCA lesion is 30% stenosed.   Prox RCA lesion is 100% stenosed.   Origin lesion is 20% stenosed.   Mid Graft lesion is 15% stenosed.   Prox Graft-1 lesion is 15% stenosed.   Origin to Prox Graft lesion before 1st Mrg  is 100% stenosed.   Origin to Prox Graft lesion is 35% stenosed.   1st Mrg-1 lesion is 99% stenosed.   1st Mrg-2 lesion is 75% stenosed.   LPAV lesion is 80% stenosed.   Non-stenotic Prox Graft-2 lesion was previously treated.   SVG.   SVG due to known occlusion.   LIMA and is normal in caliber.   The graft exhibits moderate focal disease.   LV end diastolic pressure is normal. Severe 3 vessel obstructive CAD. Co dominant circulation.  The prior angioplasty site of the left main and LCx is improved with 50% residual. This was 90% before. Patent LIMA to the LAD. 30% in stent disease in the ostial LIMA Occluded SVG sequential to the OM1 and left PDA- chronic Patent SVG to the RCA. Stents with mild nonobstructive disease. Patent stent in the left subclavian. There is a 20 mm Hg pressure gradient. But the stent appears to be widely patent. Normal LVEDP Plan: would recommend continued medical therapy. There are no suitable targets for PCI and prior PCI sites are patent  DG Chest Port 1 View  Result Date: 09/08/2021 CLINICAL DATA:  Chest pain EXAM: PORTABLE CHEST 1 VIEW COMPARISON:  08/26/2021 FINDINGS: Mild cardiac enlargement. Negative for heart failure. Postop median sternotomy. Stent in the proximal left carotid or subclavian artery. Right jugular sent venous catheter tip in the right atrium unchanged. No pneumothorax Negative for edema.   No infiltrate or effusion. IMPRESSION: No acute abnormality. Improvement in vascular congestion and interstitial edema. Electronically Signed   By: Franchot Gallo M.D.   On: 09/08/2021 11:04  Cardiac Studies   LHC 09/08/2021  Ost LM to Dist LM lesion is 50% stenosed.   Prox LAD to Mid LAD lesion is 100% stenosed.   Ost LAD to Prox LAD lesion is 100% stenosed.   Ost Cx to Mid Cx lesion is 40% stenosed.   Mid RCA to Dist RCA lesion is 30% stenosed.   Prox RCA lesion is 100% stenosed.   Origin lesion is 20% stenosed.   Mid Graft lesion is 15% stenosed.   Prox Graft-1 lesion is 15% stenosed.   Origin to Prox Graft lesion before 1st Mrg  is 100% stenosed.   Origin to Prox Graft lesion is 35% stenosed.   1st Mrg-1 lesion is 99% stenosed.   1st Mrg-2 lesion is 75% stenosed.   LPAV lesion is 80% stenosed.   Non-stenotic Prox Graft-2 lesion was previously treated.   SVG.   SVG due to known occlusion.   LIMA and is normal in caliber.   The graft exhibits moderate focal disease.   LV end diastolic pressure is normal.   Severe 3 vessel obstructive CAD. Co dominant circulation.  The prior angioplasty site of the left main and LCx is improved with 50% residual. This was 90% before.  Patent LIMA to the LAD. 30% in stent disease in the ostial LIMA Occluded SVG sequential to the OM1 and left PDA- chronic Patent SVG to the RCA. Stents with mild nonobstructive disease. Patent stent in the left subclavian. There is a 20 mm Hg pressure gradient. But the stent appears to be widely patent.  Normal LVEDP   Plan: would recommend continued medical therapy. There are no suitable targets for PCI and prior PCI sites are patent  Patient Profile     65 year old female with extensive revascularization history (obstructive RCA with diseased SVG, Obstructive LM with LIMA --> LAD and prior subclavian stent, occluded SVG --> OM1, LM 99% s/p POBA of ISR 08/26/21 with relook today for refractory angina with no  intervention) now with recurrent chest pain, s/p LHC 09/08/21 with chest pain recurrent and diffuse ST depression, interventional now discussing taking her back to the lab  Assessment & Plan    Complex CAD per above: She is s/p CABG in 2005 with multiple PCI's since and most recent being an NSTEMI in 08/2021 with cath showing 90-95% ISR of LM/LCx stent and treated with angioplasty and reduced to 50%. - Will plan for repeat catheterization today -NPO, plan for this afternoon (had small proportion of some oatmeal and eggs) - Continue ASA, Plavix, Ranexa and statin therapy. Previously intolerant to beta-blockers due to hypotension.  3. Hypotension: -  Usually occurs in the setting of HD. PTA Midodrine 10mg  BID has been ordered.    4. HLD: - LDL was at 21 on 08/27/2021. She remains on Atorvastatin 80mg  daily and Zetia 10mg  daily.    5. Type 2 DM: - Will continue her PTA Novolin 24U BID and order SSI coverage.    6. ESRD : - She usually has HD on T/TH/SAT schedule.  - Nephrology following  For questions or updates, please contact [place] Please consult [email or web address] for contact info under        Signed, Janina Mayo, MD  09/09/2021, 8:52 AM

## 2021-09-09 NOTE — Interval H&P Note (Signed)
History and Physical Interval Note:  09/09/2021 12:10 PM  Donna Howe  has presented today for surgery, with the diagnosis of cad.  The various methods of treatment have been discussed with the patient and family. After consideration of risks, benefits and other options for treatment, the patient has consented to  Procedure(s): CORONARY STENT INTERVENTION (N/A) as a surgical intervention.  The patient's history has been reviewed, patient examined, no change in status, stable for surgery.  I have reviewed the patient's chart and labs.  Questions were answered to the patient's satisfaction.    Cath Lab Visit (complete for each Cath Lab visit)  Clinical Evaluation Leading to the Procedure:   ACS: Yes.    Non-ACS:    Anginal Classification: CCS IV  Anti-ischemic medical therapy: Maximal Therapy (2 or more classes of medications)  Non-Invasive Test Results: No non-invasive testing performed  Prior CABG: Previous CABG       Donna Howe [facility] 09/09/2021 12:11 PM

## 2021-09-09 NOTE — Progress Notes (Signed)
[facility] Progress Note    Assessment/ Plan:   # ESRD:  -Outpatient orders: TTS, RKC, 4 hours 15 minutes 2K, 2.5 Cal, 400/500, EDW 93.5, Mircera 30 every 4 weeks, Venofer 50 q. Weekly. Calcitriol 0.71mcg, RIJ TDC. Does not receive heparin -next hd tomorrow   # Chest Pain/unstable angina, recent NSTEMI -per cardiology. S/p card cath 12/22. On hep and tirofiban gtt's   # Volume/ hypertension: EDW 93.5kg. Attempt to achieve EDW as tolerated   # Anemia of Chronic Kidney Disease: Hemoglobin 9.7. will give a dose of ESA today.    # Secondary Hyperparathyroidism/Hyperphosphatemia: resume home binders    # Vascular access: RIJ TDC, due for 2nd stage rue avf surgery-can likely pursue this as an outpatient unless she has a prolonged stay   # Additional recommendations: - Dose all meds for creatinine clearance < 10 ml/min  - Unless absolutely necessary, no MRIs with gadolinium.  - Implement save arm precautions.  Prefer needle sticks in the dorsum of the hands or wrists.  No blood pressure measurements in arm. - If blood transfusion is requested during hemodialysis sessions, please alert [place] prior to the session.  - If a hemodialysis catheter line culture is requested, please alert [place] as only hemodialysis nurses are able to collect those specimens.   [name], MD [facility]  Subjective:   S/p card cath yesterday-no intervention. Chest pain overnight. Tolerated HD yesterday net uf ~400cc. Patient reports feeling much better this morning.    Objective:   BP (!) 95/54    Pulse 73    Temp 98.4 F (36.9 C) (Axillary)    Resp 15    Ht 5\' 5"  (1.651 m)    Wt 95.3 kg    SpO2 100%    BMI 34.96 kg/m   Intake/Output Summary (Last 24 hours) at 09/09/2021 0736 Last data filed at 09/09/2021 0400 Gross per 24 hour  Intake 252.94 ml  Output 401 ml  Net -148.06 ml   Weight change:   Physical Exam: Gen:NAD [id number], +systolic murmur Resp:cta bl, normal  wob IEP:PIRJ Ext: no sig edema Neuro: awake, alert Dialysis access: RUE AVF, RIJ TDC (active access)  Imaging: CARDIAC CATHETERIZATION  Result Date: 09/08/2021   Ost LM to Dist LM lesion is 50% stenosed.   Prox LAD to Mid LAD lesion is 100% stenosed.   Ost LAD to Prox LAD lesion is 100% stenosed.   Ost Cx to Mid Cx lesion is 40% stenosed.   Mid RCA to Dist RCA lesion is 30% stenosed.   Prox RCA lesion is 100% stenosed.   Origin lesion is 20% stenosed.   Mid Graft lesion is 15% stenosed.   Prox Graft-1 lesion is 15% stenosed.   Origin to Prox Graft lesion before 1st Mrg  is 100% stenosed.   Origin to Prox Graft lesion is 35% stenosed.   1st Mrg-1 lesion is 99% stenosed.   1st Mrg-2 lesion is 75% stenosed.   LPAV lesion is 80% stenosed.   Non-stenotic Prox Graft-2 lesion was previously treated.   SVG.   SVG due to known occlusion.   LIMA and is normal in caliber.   The graft exhibits moderate focal disease.   LV end diastolic pressure is normal. Severe 3 vessel obstructive CAD. Co dominant circulation.  The prior angioplasty site of the left main and LCx is improved with 50% residual. This was 90% before. Patent LIMA to the LAD. 30% in stent disease in the ostial LIMA Occluded SVG  sequential to the OM1 and left PDA- chronic Patent SVG to the RCA. Stents with mild nonobstructive disease. Patent stent in the left subclavian. There is a 20 mm Hg pressure gradient. But the stent appears to be widely patent. Normal LVEDP Plan: would recommend continued medical therapy. There are no suitable targets for PCI and prior PCI sites are patent  DG Chest Port 1 View  Result Date: 09/08/2021 CLINICAL DATA:  Chest pain EXAM: PORTABLE CHEST 1 VIEW COMPARISON:  08/26/2021 FINDINGS: Mild cardiac enlargement. Negative for heart failure. Postop median sternotomy. Stent in the proximal left carotid or subclavian artery. Right jugular sent venous catheter tip in the right atrium unchanged. No pneumothorax Negative for edema.   No infiltrate or effusion. IMPRESSION: No acute abnormality. Improvement in vascular congestion and interstitial edema. Electronically Signed   By: Franchot Gallo M.D.   On: 09/08/2021 11:04    Labs: BMET Recent Labs  Lab 09/08/21 1058 09/09/21 0251  NA 134* 134*  K 4.2 3.9  CL 97* 96*  CO2 19* 27  GLUCOSE 246* 167*  BUN 40* 20  CREATININE 6.29* 4.31*  CALCIUM 9.7 8.6*   CBC Recent Labs  Lab 09/08/21 1058 09/09/21 0251  WBC 14.1* 10.0  HGB 10.7* 9.2*  HCT 34.0* 29.2*  MCV 109.3* 108.1*  PLT 220 170    Medications:     allopurinol  100 mg Oral Q T,Th,Sa-HD   ALPRAZolam  0.25 mg Oral q morning   ALPRAZolam  0.5 mg Oral QHS   aspirin EC  81 mg Oral Daily   atorvastatin  80 mg Oral QHS   Chlorhexidine Gluconate Cloth  6 each Topical Q0600   clopidogrel  75 mg Oral Daily   darbepoetin (ARANESP) injection - DIALYSIS  40 mcg Intravenous Q Thu-HD   ezetimibe  10 mg Oral QHS   ferric citrate  420 mg Oral TID WC   fluticasone furoate-vilanterol  1 puff Inhalation Daily   gabapentin  400 mg Oral QHS   heparin sodium (porcine)       insulin aspart protamine- aspart  24 Units Subcutaneous BID WC   levothyroxine  125 mcg Oral QAC breakfast   midodrine  10 mg Oral BID WC   morphine  2 mg Intravenous Once   pantoprazole  40 mg Oral BID   ranolazine  500 mg Oral QHS   sodium chloride flush  3 mL Intravenous Q12H      [name], MD [facility] 09/09/2021, 7:36 AM

## 2021-09-09 NOTE — Progress Notes (Signed)
Patients ACT still not at goal; notified Ignacia Bayley of continued bleeding from sheath.  He came to bedside to assess and instructed me to go ahead and pull sheath.  May use femstop if needed.

## 2021-09-10 ENCOUNTER — Encounter (HOSPITAL_COMMUNITY): Payer: Self-pay | Admitting: Internal Medicine

## 2021-09-10 DIAGNOSIS — I200 Unstable angina: Secondary | ICD-10-CM | POA: Diagnosis not present

## 2021-09-10 LAB — BASIC METABOLIC PANEL
Anion gap: 12 (ref 5–15)
BUN: 34 mg/dL — ABNORMAL HIGH (ref 8–23)
CO2: 25 mmol/L (ref 22–32)
Calcium: 9.1 mg/dL (ref 8.9–10.3)
Chloride: 94 mmol/L — ABNORMAL LOW (ref 98–111)
Creatinine, Ser: 5.93 mg/dL — ABNORMAL HIGH (ref 0.44–1.00)
GFR, Estimated: 7 mL/min — ABNORMAL LOW (ref >60)
Glucose, Bld: 225 mg/dL — ABNORMAL HIGH (ref 70–99)
Potassium: 4.8 mmol/L (ref 3.5–5.1)
Sodium: 131 mmol/L — ABNORMAL LOW (ref 135–145)

## 2021-09-10 LAB — CBC
HCT: 28.6 % — ABNORMAL LOW (ref 36.0–46.0)
Hemoglobin: 8.9 g/dL — ABNORMAL LOW (ref 12.0–15.0)
MCH: 33.7 pg (ref 26.0–34.0)
MCHC: 31.1 g/dL (ref 30.0–36.0)
MCV: 108.3 fL — ABNORMAL HIGH (ref 80.0–100.0)
Platelets: 173 10*3/uL (ref 150–400)
RBC: 2.64 MIL/uL — ABNORMAL LOW (ref 3.87–5.11)
RDW: 14.6 % (ref 11.5–15.5)
WBC: 9.9 10*3/uL (ref 4.0–10.5)
nRBC: 0 % (ref 0.0–0.2)

## 2021-09-10 LAB — GLUCOSE, CAPILLARY
Glucose-Capillary: 216 mg/dL — ABNORMAL HIGH (ref 70–99)
Glucose-Capillary: 211 mg/dL — ABNORMAL HIGH (ref 70–99)
Glucose-Capillary: 156 mg/dL — ABNORMAL HIGH (ref 70–99)
Glucose-Capillary: 104 mg/dL — ABNORMAL HIGH (ref 70–99)

## 2021-09-10 MED ORDER — INSULIN ASPART 100 UNIT/ML IJ SOLN
0.0000 [IU] | Freq: Every day | INTRAMUSCULAR | Status: DC
  Administered 2021-09-11 – 2021-09-16 (×3): 2 [IU] via SUBCUTANEOUS

## 2021-09-10 MED ORDER — ISOSORBIDE MONONITRATE ER 30 MG PO TB24
30.0000 mg | ORAL_TABLET | Freq: Every day | ORAL | Status: DC
  Administered 2021-09-10: 12:00:00 30 mg via ORAL
  Filled 2021-09-10: qty 1

## 2021-09-10 MED ORDER — INSULIN ASPART 100 UNIT/ML IJ SOLN: 0.0000 [IU] | Freq: Three times a day (TID) | INTRAMUSCULAR | Status: DC

## 2021-09-10 NOTE — Progress Notes (Signed)
Patient transferred from [facility] at 1340hrs.  Oriented to unit and plan of care for shift. Patient verbalized understanding.

## 2021-09-10 NOTE — Progress Notes (Signed)
[facility] Progress Note    Assessment/ Plan:   # ESRD:  -Outpatient orders: TTS, RKC, 4 hours 15 minutes 2K, 2.5 Cal, 400/500, EDW 93.5, Mircera 30 every 4 weeks, Venofer 50 q. Weekly. Calcitriol 0.20mcg, RIJ TDC. Does not receive heparin -next hd today -recommend avoiding morphine   # Chest Pain/unstable angina, recent NSTEMI -per cardiology. S/p card cath 12/22. On nitro gtt -s/p 12/23 PCI to left main into Lcx w/ balloon angio and shockwave therapy, first OM not salvageable (severe disease)   # Volume/ hypertension: EDW 93.5kg. UF'ing as tolerated   # Anemia of Chronic Kidney Disease: Hemoglobin 8.9. on esa   # Secondary Hyperparathyroidism/Hyperphosphatemia: monitor phos. renal diet, will start binders if needed   # Vascular access: RIJ TDC, due for 2nd stage rue avf surgery-can likely pursue this as an outpatient unless she has a prolonged stay   # Additional recommendations: - Dose all meds for creatinine clearance < 10 ml/min  - Unless absolutely necessary, no MRIs with gadolinium.  - Implement save arm precautions.  Prefer needle sticks in the dorsum of the hands or wrists.  No blood pressure measurements in arm. - If blood transfusion is requested during hemodialysis sessions, please alert [place] prior to the session.  - If a hemodialysis catheter line culture is requested, please alert [place] as only hemodialysis nurses are able to collect those specimens.   [name], MD [facility]  Subjective:   S/p PCI yesterday. Still with ongoing chest pain, on ntiro gtt   Objective:   BP (!) 115/56    Pulse 82    Temp 98.7 F (37.1 C) (Oral)    Resp 15    Ht 5\' 5"  (1.651 m)    Wt 95.3 kg    SpO2 94%    BMI 34.96 kg/m   Intake/Output Summary (Last 24 hours) at 09/10/2021 0900 Last data filed at 09/10/2021 0600 Gross per 24 hour  Intake 552.18 ml  Output --  Net 552.18 ml   Weight change:   Physical Exam: Gen:NAD [id number], +systolic  murmur Resp:cta bl, normal wob GEX:BMWU Ext: no sig edema Neuro: awake, alert Dialysis access: RUE AVF, RIJ TDC (active access)  Imaging: CARDIAC CATHETERIZATION  Result Date: 09/09/2021   Ost LM to Dist LM lesion is 60% stenosed.   Balloon angioplasty was performed using a BALLN SAPPHIRE [place] 3.5X8.   Post intervention, there is a 30% residual stenosis. Successful PCI of the left main into the LCx with Shockwave therapy and aggressive balloon angioplasty. Significant improvement in stent expansion. There is occlusion of the first OM but this vessel was already severely diseased. Plan: DAPT with ASA and Plavix indefinitely. Continue aggressive medical therapy for residual disease. Anticipate dialysis tomorrow.   CARDIAC CATHETERIZATION  Result Date: 09/09/2021   Ost LM to Dist LM lesion is 50% stenosed.   Prox LAD to Mid LAD lesion is 100% stenosed.   Ost LAD to Prox LAD lesion is 100% stenosed.   Ost Cx to Mid Cx lesion is 40% stenosed.   Mid RCA to Dist RCA lesion is 30% stenosed.   Prox RCA lesion is 100% stenosed.   Origin lesion is 20% stenosed.   Mid Graft lesion is 15% stenosed.   Prox Graft-1 lesion is 15% stenosed.   Origin to Prox Graft lesion before 1st Mrg  is 100% stenosed.   Origin to Prox Graft lesion is 35% stenosed.   1st Mrg-1 lesion is 99% stenosed.   1st  Mrg-2 lesion is 75% stenosed.   LPAV lesion is 80% stenosed.   Non-stenotic Prox Graft-2 lesion was previously treated.   SVG.   SVG due to known occlusion.   LIMA and is normal in caliber.   The graft exhibits moderate focal disease.   LV end diastolic pressure is normal. Severe 3 vessel obstructive CAD. Co dominant circulation.  The prior angioplasty site of the left main and LCx is improved with 50% residual. This was 90% before. Patent LIMA to the LAD. 30% in stent disease in the ostial LIMA Occluded SVG sequential to the OM1 and left PDA- chronic Patent SVG to the RCA. Stents with mild nonobstructive disease. Patent stent in  the left subclavian. There is a 20 mm Hg pressure gradient. But the stent appears to be widely patent. Normal LVEDP Plan: would recommend continued medical therapy. There are no suitable targets for PCI and prior PCI sites are patent  DG Chest Port 1 View  Result Date: 09/08/2021 CLINICAL DATA:  Chest pain EXAM: PORTABLE CHEST 1 VIEW COMPARISON:  08/26/2021 FINDINGS: Mild cardiac enlargement. Negative for heart failure. Postop median sternotomy. Stent in the proximal left carotid or subclavian artery. Right jugular sent venous catheter tip in the right atrium unchanged. No pneumothorax Negative for edema.  No infiltrate or effusion. IMPRESSION: No acute abnormality. Improvement in vascular congestion and interstitial edema. Electronically Signed   By: Franchot Gallo M.D.   On: 09/08/2021 11:04    Labs: BMET Recent Labs  Lab 09/08/21 1058 09/09/21 0251 09/09/21 1806 09/10/21 0119  NA 134* 134* 131* 131*  K 4.2 3.9 4.1 4.8  CL 97* 96* 95* 94*  CO2 19* 27 25 25   GLUCOSE 246* 167* 244* 225*  BUN 40* 20 29* 34*  CREATININE 6.29* 4.31* 5.42* 5.93*  CALCIUM 9.7 8.6* 8.5* 9.1  PHOS  --   --  5.9*  --    CBC Recent Labs  Lab 09/08/21 1058 09/09/21 0251 09/10/21 0119  WBC 14.1* 10.0 9.9  HGB 10.7* 9.2* 8.9*  HCT 34.0* 29.2* 28.6*  MCV 109.3* 108.1* 108.3*  PLT 220 170 173    Medications:     allopurinol  100 mg Oral Q T,Th,Sa-HD   ALPRAZolam  0.25 mg Oral q morning   ALPRAZolam  0.5 mg Oral QHS   aspirin EC  81 mg Oral Daily   atorvastatin  80 mg Oral QHS   Chlorhexidine Gluconate Cloth  6 each Topical Q0600   clopidogrel  75 mg Oral Daily   darbepoetin (ARANESP) injection - DIALYSIS  40 mcg Intravenous Q Thu-HD   ezetimibe  10 mg Oral QHS   ferric citrate  420 mg Oral TID WC   fluticasone furoate-vilanterol  1 puff Inhalation Daily   gabapentin  400 mg Oral QHS   heparin  5,000 Units Subcutaneous Q8H   insulin aspart  0-15 Units Subcutaneous TID WC   insulin aspart  0-5  Units Subcutaneous QHS   insulin aspart  0-6 Units Subcutaneous TID WC   insulin aspart protamine- aspart  24 Units Subcutaneous BID WC   levothyroxine  125 mcg Oral QAC breakfast   midodrine  10 mg Oral BID WC   morphine  2 mg Intravenous Once   pantoprazole  40 mg Oral BID   ranolazine  500 mg Oral QHS   sodium chloride flush  3 mL Intravenous Q12H   sodium chloride flush  3 mL Intravenous Q12H   sodium chloride flush  3 mL Intravenous  Bell, MD [facility] 09/10/2021, 9:00 AM

## 2021-09-10 NOTE — Progress Notes (Signed)
Progress Note  Patient Name: Donna Howe Date of Encounter: 09/10/2021  [facility] Cardiologist: Rozann Lesches, MD   Subjective   Remains on NTG gtt at 35. CP much improved but not completely resolved.   Groin site stable. Plan for HD today.   Wants to go home  Inpatient Medications    Scheduled Meds:  allopurinol  100 mg Oral Q T,Th,Sa-HD   ALPRAZolam  0.25 mg Oral q morning   ALPRAZolam  0.5 mg Oral QHS   aspirin EC  81 mg Oral Daily   atorvastatin  80 mg Oral QHS   Chlorhexidine Gluconate Cloth  6 each Topical Q0600   clopidogrel  75 mg Oral Daily   darbepoetin (ARANESP) injection - DIALYSIS  40 mcg Intravenous Q Thu-HD   ezetimibe  10 mg Oral QHS   ferric citrate  420 mg Oral TID WC   fluticasone furoate-vilanterol  1 puff Inhalation Daily   gabapentin  400 mg Oral QHS   heparin  5,000 Units Subcutaneous Q8H   insulin aspart  0-15 Units Subcutaneous TID WC   insulin aspart  0-5 Units Subcutaneous QHS   insulin aspart  0-6 Units Subcutaneous TID WC   insulin aspart protamine- aspart  24 Units Subcutaneous BID WC   isosorbide mononitrate  30 mg Oral Daily   levothyroxine  125 mcg Oral QAC breakfast   midodrine  10 mg Oral BID WC   morphine  2 mg Intravenous Once   pantoprazole  40 mg Oral BID   ranolazine  500 mg Oral QHS   sodium chloride flush  3 mL Intravenous Q12H   sodium chloride flush  3 mL Intravenous Q12H   sodium chloride flush  3 mL Intravenous Q12H   Continuous Infusions:  sodium chloride     PRN Meds: sodium chloride, acetaminophen, albuterol, HYDROmorphone (DILAUDID) injection, ipratropium, morphine injection, nitroGLYCERIN, ondansetron (ZOFRAN) IV, ondansetron, sodium chloride flush, sodium chloride flush   Vital Signs    Vitals:   09/10/21 0800 09/10/21 0900 09/10/21 0936 09/10/21 1000  BP: 114/70 (!) 80/45 (!) 85/47 (!) 106/55  Pulse: 82 72 77 77  Resp: 18 11 15 16   Temp:      TempSrc:      SpO2: 98% 98% 100% 94%  Weight:       Height:        Intake/Output Summary (Last 24 hours) at 09/10/2021 1130 Last data filed at 09/10/2021 0800 Gross per 24 hour  Intake 754.71 ml  Output --  Net 754.71 ml    Last 3 Weights 09/09/2021 09/08/2021 09/08/2021  Weight (lbs) 210 lb 1.6 oz 214 lb 1.1 oz 214 lb 1.1 oz  Weight (kg) 95.3 kg 97.1 kg 97.1 kg      Telemetry    NSR 70s Personally reviewed  Physical Exam   Vitals:   09/10/21 0936 09/10/21 1000  BP: (!) 85/47 (!) 106/55  Pulse: 77 77  Resp: 15 16  Temp:    SpO2: 100% 94%    General:  Sitting up in bed No resp difficulty HEENT: normal Neck: supple JVP 7-9 Carotids 2+ bilat; no bruits. No lymphadenopathy or thryomegaly appreciated. RIJ tunneled cath Cor: PMI nondisplaced. Regular rate & rhythm. No rubs, gallops or murmurs. Lungs: clear Abdomen: obese soft, nontender, nondistended. No hepatosplenomegaly. No bruits or masses. Good bowel sounds. Extremities: no cyanosis, clubbing, rash, edema  RFA site ok . No hematoma or bruit Neuro: alert & orientedx3, cranial nerves grossly intact. moves all 4  extremities w/o difficulty. Affect pleasant   Labs    High Sensitivity Troponin:   Recent Labs  Lab 09/08/21 1034 09/08/21 1210 09/09/21 0251 09/09/21 0631 09/09/21 2019  TROPONINIHS 134* 173* 1,641* 2,762* 7,276*      Chemistry Recent Labs  Lab 09/09/21 0251 09/09/21 1806 09/10/21 0119  NA 134* 131* 131*  K 3.9 4.1 4.8  CL 96* 95* 94*  CO2 27 25 25   GLUCOSE 167* 244* 225*  BUN 20 29* 34*  CREATININE 4.31* 5.42* 5.93*  CALCIUM 8.6* 8.5* 9.1  PROT 6.2*  --   --   ALBUMIN 3.6 3.1*  --   AST 85*  --   --   ALT 45*  --   --   ALKPHOS 78  --   --   BILITOT 0.9  --   --   GFRNONAA 11* 8* 7*  ANIONGAP 11 11 12      Lipids No results for input(s): CHOL, TRIG, HDL, LABVLDL, LDLCALC, CHOLHDL in the last 168 hours.  Hematology Recent Labs  Lab 09/08/21 1058 09/09/21 0251 09/10/21 0119  WBC 14.1* 10.0 9.9  RBC 3.11* 2.70* 2.64*  HGB  10.7* 9.2* 8.9*  HCT 34.0* 29.2* 28.6*  MCV 109.3* 108.1* 108.3*  MCH 34.4* 34.1* 33.7  MCHC 31.5 31.5 31.1  RDW 14.6 14.6 14.6  PLT 220 170 173    Thyroid No results for input(s): TSH, FREET4 in the last 168 hours.  BNP Recent Labs  Lab 09/09/21 0251  BNP 3,297.0*     DDimer No results for input(s): DDIMER in the last 168 hours.   Radiology    CARDIAC CATHETERIZATION  Result Date: 09/09/2021   Ost LM to Dist LM lesion is 60% stenosed.   Balloon angioplasty was performed using a BALLN SAPPHIRE [place] 3.5X8.   Post intervention, there is a 30% residual stenosis. Successful PCI of the left main into the LCx with Shockwave therapy and aggressive balloon angioplasty. Significant improvement in stent expansion. There is occlusion of the first OM but this vessel was already severely diseased. Plan: DAPT with ASA and Plavix indefinitely. Continue aggressive medical therapy for residual disease. Anticipate dialysis tomorrow.   CARDIAC CATHETERIZATION  Result Date: 09/09/2021   Ost LM to Dist LM lesion is 50% stenosed.   Prox LAD to Mid LAD lesion is 100% stenosed.   Ost LAD to Prox LAD lesion is 100% stenosed.   Ost Cx to Mid Cx lesion is 40% stenosed.   Mid RCA to Dist RCA lesion is 30% stenosed.   Prox RCA lesion is 100% stenosed.   Origin lesion is 20% stenosed.   Mid Graft lesion is 15% stenosed.   Prox Graft-1 lesion is 15% stenosed.   Origin to Prox Graft lesion before 1st Mrg  is 100% stenosed.   Origin to Prox Graft lesion is 35% stenosed.   1st Mrg-1 lesion is 99% stenosed.   1st Mrg-2 lesion is 75% stenosed.   LPAV lesion is 80% stenosed.   Non-stenotic Prox Graft-2 lesion was previously treated.   SVG.   SVG due to known occlusion.   LIMA and is normal in caliber.   The graft exhibits moderate focal disease.   LV end diastolic pressure is normal. Severe 3 vessel obstructive CAD. Co dominant circulation.  The prior angioplasty site of the left main and LCx is improved with 50% residual.  This was 90% before. Patent LIMA to the LAD. 30% in stent disease in the ostial LIMA Occluded SVG sequential  to the OM1 and left PDA- chronic Patent SVG to the RCA. Stents with mild nonobstructive disease. Patent stent in the left subclavian. There is a 20 mm Hg pressure gradient. But the stent appears to be widely patent. Normal LVEDP Plan: would recommend continued medical therapy. There are no suitable targets for PCI and prior PCI sites are patent   Cardiac Studies   LHC 09/08/2021  Ost LM to Dist LM lesion is 50% stenosed.   Prox LAD to Mid LAD lesion is 100% stenosed.   Ost LAD to Prox LAD lesion is 100% stenosed.   Ost Cx to Mid Cx lesion is 40% stenosed.   Mid RCA to Dist RCA lesion is 30% stenosed.   Prox RCA lesion is 100% stenosed.   Origin lesion is 20% stenosed.   Mid Graft lesion is 15% stenosed.   Prox Graft-1 lesion is 15% stenosed.   Origin to Prox Graft lesion before 1st Mrg  is 100% stenosed.   Origin to Prox Graft lesion is 35% stenosed.   1st Mrg-1 lesion is 99% stenosed.   1st Mrg-2 lesion is 75% stenosed.   LPAV lesion is 80% stenosed.   Non-stenotic Prox Graft-2 lesion was previously treated.   SVG.   SVG due to known occlusion.   LIMA and is normal in caliber.   The graft exhibits moderate focal disease.   LV end diastolic pressure is normal.   Severe 3 vessel obstructive CAD. Co dominant circulation.  The prior angioplasty site of the left main and LCx is improved with 50% residual. This was 90% before.  Patent LIMA to the LAD. 30% in stent disease in the ostial LIMA Occluded SVG sequential to the OM1 and left PDA- chronic Patent SVG to the RCA. Stents with mild nonobstructive disease. Patent stent in the left subclavian. There is a 20 mm Hg pressure gradient. But the stent appears to be widely patent.  Normal LVEDP   Plan: would recommend continued medical therapy. There are no suitable targets for PCI and prior PCI sites are patent  Patient Profile      65 year old female with extensive revascularization history (obstructive RCA with diseased SVG, Obstructive LM with LIMA --> LAD and prior subclavian stent, occluded SVG --> OM1, LM 99% s/p POBA of ISR 08/26/21 with relook today for refractory angina with no intervention) now with recurrent chest pain, s/p LHC 09/08/21 with chest pain recurrent and diffuse ST depression, interventional now discussing taking her back to the lab  Assessment & Plan    1. NSTEMI with complex CAD per above: She is s/p CABG in 2005 with multiple PCI's since and most recent being an NSTEMI in 08/2021 with cath showing 90-95% ISR of LM/LCx stent and treated with angioplasty and reduced to 50%. - Post PCI pain related to loss of heavily diseased OM - CP improved wean IV NTG to po Imdur - Continue ASA, Plavix, Ranexa and statin therapy. Previously intolerant to beta-blockers due to hypotension.  2. ESRD : - She usually has HD on T/TH/SAT schedule.  - Nephrology following - Plan for HD today via [place]  3. Hypotension: -  Usually occurs in the setting of HD. PTA Midodrine 10mg  BID has been ordered.  - Has not tolerated Imdur previously due to low BP. Can wean off as needed   4. HLD: - LDL was at 21 on 08/27/2021. She remains on Atorvastatin 80mg  daily and Zetia 10mg  daily.    5. Type 2 DM: - Will  continue her PTA Novolin 24U BID and SSI coverage.   Disposition: Can go to floor. Possibly home tomorrow if CP controlled  For questions or updates, please contact [place] Please consult [email or web address] for contact info under        Signed, Glori Bickers, MD  09/10/2021, 11:30 AM

## 2021-09-10 NOTE — Plan of Care (Signed)

## 2021-09-10 NOTE — Progress Notes (Signed)
CARDIAC REHAB PHASE I    PRE:  Rate/Rhythm: 85 SR     SaO2: 100 RA    Pt just moved to 6E from [place] and was fatigued. No walk. Reinforced Education given from last week on heart healthy diet, radial/femoral weight restrictions, adherence to NTG, Brilinta and ASA.  Home exercise guidelines given and will refer to cardiac rehab phase 2 at [facility]. Pt left in the bed with call bell in reach. All questions were answered and pt verbalized understanding.  [id number] Kirby Funk ACSM-CEP 09/10/2021 2:28 PM

## 2021-09-11 DIAGNOSIS — I200 Unstable angina: Secondary | ICD-10-CM | POA: Diagnosis not present

## 2021-09-11 LAB — CBC
HCT: 24.4 % — ABNORMAL LOW (ref 36.0–46.0)
Hemoglobin: 8 g/dL — ABNORMAL LOW (ref 12.0–15.0)
MCH: 35.2 pg — ABNORMAL HIGH (ref 26.0–34.0)
MCHC: 32.8 g/dL (ref 30.0–36.0)
MCV: 107.5 fL — ABNORMAL HIGH (ref 80.0–100.0)
Platelets: 149 10*3/uL — ABNORMAL LOW (ref 150–400)
RBC: 2.27 MIL/uL — ABNORMAL LOW (ref 3.87–5.11)
RDW: 14.4 % (ref 11.5–15.5)
WBC: 10.2 10*3/uL (ref 4.0–10.5)
nRBC: 0.4 % — ABNORMAL HIGH (ref 0.0–0.2)

## 2021-09-11 LAB — BASIC METABOLIC PANEL
Anion gap: 13 (ref 5–15)
BUN: 49 mg/dL — ABNORMAL HIGH (ref 8–23)
CO2: 23 mmol/L (ref 22–32)
Calcium: 8.9 mg/dL (ref 8.9–10.3)
Chloride: 93 mmol/L — ABNORMAL LOW (ref 98–111)
Creatinine, Ser: 7.45 mg/dL — ABNORMAL HIGH (ref 0.44–1.00)
GFR, Estimated: 6 mL/min — ABNORMAL LOW (ref >60)
Glucose, Bld: 190 mg/dL — ABNORMAL HIGH (ref 70–99)
Potassium: 5.1 mmol/L (ref 3.5–5.1)
Sodium: 129 mmol/L — ABNORMAL LOW (ref 135–145)

## 2021-09-11 LAB — GLUCOSE, CAPILLARY
Glucose-Capillary: 278 mg/dL — ABNORMAL HIGH (ref 70–99)
Glucose-Capillary: 260 mg/dL — ABNORMAL HIGH (ref 70–99)
Glucose-Capillary: 156 mg/dL — ABNORMAL HIGH (ref 70–99)
Glucose-Capillary: 107 mg/dL — ABNORMAL HIGH (ref 70–99)
Glucose-Capillary: 227 mg/dL — ABNORMAL HIGH (ref 70–99)

## 2021-09-11 MED ORDER — MIDODRINE HCL 5 MG PO TABS
10.0000 mg | ORAL_TABLET | Freq: Once | ORAL | Status: AC
  Administered 2021-09-11: 22:00:00 10 mg via ORAL
  Filled 2021-09-11: qty 2

## 2021-09-11 MED ORDER — CHLORHEXIDINE GLUCONATE CLOTH 2 % EX PADS
6.0000 | MEDICATED_PAD | Freq: Every day | CUTANEOUS | Status: DC
  Administered 2021-09-11 – 2021-09-16 (×5): 6 via TOPICAL

## 2021-09-11 MED ORDER — CLOPIDOGREL BISULFATE 75 MG PO TABS: 75.0000 mg | ORAL_TABLET | Freq: Every day | ORAL | 3 refills | Status: DC

## 2021-09-11 MED ORDER — ISOSORBIDE MONONITRATE ER 30 MG PO TB24
15.0000 mg | ORAL_TABLET | Freq: Every day | ORAL | Status: DC
  Administered 2021-09-13 – 2021-09-15 (×3): 15 mg via ORAL
  Filled 2021-09-11 (×4): qty 1

## 2021-09-11 NOTE — Progress Notes (Signed)
[facility] Progress Note    Assessment/ Plan:   # ESRD:  -Outpatient orders: TTS, [facility], 4 hours 15 minutes 2K, 2.5 Cal, 400/500, EDW 93.5, Mircera 30 every 4 weeks, Venofer 50 q. Weekly. Calcitriol 0.49mcg, RIJ TDC. Does not receive heparin -will see if she can get HD, will discussed with on call HD otherwise will need to be tomorrow (off sched) -recommend avoiding morphine   # Chest Pain/unstable angina, recent NSTEMI -per cardiology. S/p card cath 12/22. On nitro gtt -s/p 12/23 PCI to left main into Lcx w/ balloon angio and shockwave therapy, first OM not salvageable (severe disease)   # Volume/ hypertension: EDW 93.5kg. UF'ing as tolerated   # Anemia of Chronic Kidney Disease: Hemoglobin 8.0. on esa   # Secondary Hyperparathyroidism/Hyperphosphatemia: monitor phos. renal diet, will start binders if needed  #Hyponatremia -managing with HD, 137Na bath   # Vascular access: RIJ TDC, due for 2nd stage rue avf surgery-can likely pursue this as an outpatient unless she has a prolonged stay   # Additional recommendations: - Dose all meds for creatinine clearance < 10 ml/min  - Unless absolutely necessary, no MRIs with gadolinium.  - Implement save arm precautions.  Prefer needle sticks in the dorsum of the hands or wrists.  No blood pressure measurements in arm. - If blood transfusion is requested during hemodialysis sessions, please alert [place] prior to the session.  - If a hemodialysis catheter line culture is requested, please alert [place] as only hemodialysis nurses are able to collect those specimens.   [name], MD [facility]  Subjective:   Did not receive HD yesterday-staffing issues. No complaints this AM. Denies any worsening edema/SOB. Possible d/c post HD (if able to be done today)   Objective:   BP (!) 80/48 (BP Location: Left Arm)    Pulse 78    Temp 99.8 F (37.7 C) (Oral)    Resp 17    Ht 5\' 5"  (1.651 m)    Wt 97.2 kg    SpO2 99%    BMI  35.66 kg/m   Intake/Output Summary (Last 24 hours) at 09/11/2021 0950 Last data filed at 09/10/2021 1946 Gross per 24 hour  Intake 777.2 ml  Output --  Net 777.2 ml   Weight change:   Physical Exam: Gen:NAD, laying flat in bed SAY:T0Z6, +systolic murmur Resp:cta bl, normal wob WFU:XNAT Ext: no sig edema Neuro: awake, alert Dialysis access: RUE AVF, RIJ TDC (active access)  Imaging: CARDIAC CATHETERIZATION  Result Date: 09/09/2021   Ost LM to Dist LM lesion is 60% stenosed.   Balloon angioplasty was performed using a BALLN SAPPHIRE [place] 3.5X8.   Post intervention, there is a 30% residual stenosis. Successful PCI of the left main into the LCx with Shockwave therapy and aggressive balloon angioplasty. Significant improvement in stent expansion. There is occlusion of the first OM but this vessel was already severely diseased. Plan: DAPT with ASA and Plavix indefinitely. Continue aggressive medical therapy for residual disease. Anticipate dialysis tomorrow.    Labs: BMET Recent Labs  Lab 09/08/21 1058 09/09/21 0251 09/09/21 1806 09/10/21 0119 09/11/21 0327  NA 134* 134* 131* 131* 129*  K 4.2 3.9 4.1 4.8 5.1  CL 97* 96* 95* 94* 93*  CO2 19* 27 25 25 23   GLUCOSE 246* 167* 244* 225* 190*  BUN 40* 20 29* 34* 49*  CREATININE 6.29* 4.31* 5.42* 5.93* 7.45*  CALCIUM 9.7 8.6* 8.5* 9.1 8.9  PHOS  --   --  5.9*  --   --    CBC Recent Labs  Lab 09/08/21 1058 09/09/21 0251 09/10/21 0119 09/11/21 0327  WBC 14.1* 10.0 9.9 10.2  HGB 10.7* 9.2* 8.9* 8.0*  HCT 34.0* 29.2* 28.6* 24.4*  MCV 109.3* 108.1* 108.3* 107.5*  PLT 220 170 173 149*    Medications:     allopurinol  100 mg Oral Q T,Th,Sa-HD   ALPRAZolam  0.25 mg Oral q morning   ALPRAZolam  0.5 mg Oral QHS   aspirin EC  81 mg Oral Daily   atorvastatin  80 mg Oral QHS   Chlorhexidine Gluconate Cloth  6 each Topical Daily   clopidogrel  75 mg Oral Daily   darbepoetin (ARANESP) injection - DIALYSIS  40 mcg Intravenous Q  Thu-HD   ezetimibe  10 mg Oral QHS   ferric citrate  420 mg Oral TID WC   fluticasone furoate-vilanterol  1 puff Inhalation Daily   gabapentin  400 mg Oral QHS   heparin  5,000 Units Subcutaneous Q8H   insulin aspart  0-15 Units Subcutaneous TID WC   insulin aspart  0-5 Units Subcutaneous QHS   insulin aspart protamine- aspart  24 Units Subcutaneous BID WC   isosorbide mononitrate  15 mg Oral Daily   levothyroxine  125 mcg Oral QAC breakfast   midodrine  10 mg Oral BID WC   morphine  2 mg Intravenous Once   pantoprazole  40 mg Oral BID   ranolazine  500 mg Oral QHS      [name], MD [facility] 09/11/2021, 9:50 AM

## 2021-09-11 NOTE — Progress Notes (Signed)
°   09/11/21 1927  Assess: MEWS Score  Temp 98.6 F (37 C)  BP (!) 71/41  Pulse Rate 74  ECG Heart Rate 75  SpO2 99 %  Assess: MEWS Score  MEWS Temp 0  MEWS Systolic 2  MEWS Pulse 0  MEWS RR 0  MEWS LOC 0  MEWS Score 2  MEWS Score Color Yellow  Assess: if the MEWS score is Yellow or Red  Were vital signs taken at a resting state? Yes  Focused Assessment Change from prior assessment (see assessment flowsheet)  Early Detection of Sepsis Score *See Row Information* Low  MEWS guidelines implemented *See Row Information* Yes  Take Vital Signs  Increase Vital Sign Frequency  Yellow: Q 2hr X 2 then Q 4hr X 2, if remains yellow, continue Q 4hrs  Escalate  MEWS: Escalate Yellow: discuss with charge nurse/RN and consider discussing with provider and RRT  Notify: Charge Nurse/RN  Name of Charge Nurse/RN Notified Mariah, RN  Date Charge Nurse/RN Notified 09/11/21  Time Charge Nurse/RN Notified 1930  Notify: Provider  Provider Name/Title Paticia Stack, MD  Date Provider Notified 09/11/21  Time Provider Notified 1930  Notification Type Page  Notification Reason Change in status  Provider response See new orders  Date of Provider Response 09/11/21  Time of Provider Response 1940  Document  Patient Outcome Not stable and remains on department  Progress note created (see row info) Yes

## 2021-09-11 NOTE — Progress Notes (Signed)
Progress Note  Patient Name: Donna Howe Date of Encounter: 09/11/2021  [facility] Cardiologist: Rozann Lesches, MD   Subjective   Nitroglycerin was stopped yesterday.  No current chest pain.  Has plans for dialysis today.  Likely home postdialysis.  Inpatient Medications    Scheduled Meds:  allopurinol  100 mg Oral Q T,Th,Sa-HD   ALPRAZolam  0.25 mg Oral q morning   ALPRAZolam  0.5 mg Oral QHS   aspirin EC  81 mg Oral Daily   atorvastatin  80 mg Oral QHS   Chlorhexidine Gluconate Cloth  6 each Topical Daily   clopidogrel  75 mg Oral Daily   darbepoetin (ARANESP) injection - DIALYSIS  40 mcg Intravenous Q Thu-HD   ezetimibe  10 mg Oral QHS   ferric citrate  420 mg Oral TID WC   fluticasone furoate-vilanterol  1 puff Inhalation Daily   gabapentin  400 mg Oral QHS   heparin  5,000 Units Subcutaneous Q8H   insulin aspart  0-15 Units Subcutaneous TID WC   insulin aspart  0-5 Units Subcutaneous QHS   insulin aspart protamine- aspart  24 Units Subcutaneous BID WC   isosorbide mononitrate  30 mg Oral Daily   levothyroxine  125 mcg Oral QAC breakfast   midodrine  10 mg Oral BID WC   morphine  2 mg Intravenous Once   pantoprazole  40 mg Oral BID   ranolazine  500 mg Oral QHS   Continuous Infusions:   PRN Meds: acetaminophen, albuterol, HYDROmorphone (DILAUDID) injection, ipratropium, morphine injection, nitroGLYCERIN, ondansetron (ZOFRAN) IV, ondansetron   Vital Signs    Vitals:   09/11/21 0543 09/11/21 0650 09/11/21 0746 09/11/21 0807  BP:  (!) 80/48    Pulse: 78     Resp: 17     Temp: 99.8 F (37.7 C)     TempSrc: Oral     SpO2: 95%  99%   Weight:    97.2 kg  Height:        Intake/Output Summary (Last 24 hours) at 09/11/2021 0850 Last data filed at 09/10/2021 1946 Gross per 24 hour  Intake 777.2 ml  Output --  Net 777.2 ml    Last 3 Weights 09/11/2021 09/10/2021 09/09/2021  Weight (lbs) 214 lb 4.8 oz 213 lb 11.2 oz 210 lb 1.6 oz  Weight (kg)  97.206 kg 96.934 kg 95.3 kg      Telemetry    Sinus rhythm-personally reviewed  Physical Exam   Vitals:   09/11/21 0650 09/11/21 0746  BP: (!) 80/48   Pulse:    Resp:    Temp:    SpO2:  99%   GEN: Well nourished, well developed, in no acute distress  HEENT: normal  Neck: no JVD, carotid bruits, or masses Cardiac: RRR; no murmurs, rubs, or gallops,no edema  Respiratory:  clear to auscultation bilaterally, normal work of breathing GI: soft, nontender, nondistended, + BS MS: no deformity or atrophy  Skin: warm and dry Neuro:  Strength and sensation are intact Psych: euthymic mood, full affect   General:  Sitting up in bed No resp difficulty HEENT: normal Neck: supple JVP 7-9 Carotids 2+ bilat; no bruits. No lymphadenopathy or thryomegaly appreciated. RIJ tunneled cath Cor: PMI nondisplaced. Regular rate & rhythm. No rubs, gallops or murmurs. Lungs: clear Abdomen: obese soft, nontender, nondistended. No hepatosplenomegaly. No bruits or masses. Good bowel sounds. Extremities: no cyanosis, clubbing, rash, edema  RFA site ok . No hematoma or bruit Neuro: alert & orientedx3, cranial  nerves grossly intact. moves all 4 extremities w/o difficulty. Affect pleasant   Labs    High Sensitivity Troponin:   Recent Labs  Lab 09/08/21 1034 09/08/21 1210 09/09/21 0251 09/09/21 0631 09/09/21 2019  TROPONINIHS 134* 173* 1,641* 2,762* 7,276*      Chemistry Recent Labs  Lab 09/09/21 0251 09/09/21 1806 09/10/21 0119 09/11/21 0327  NA 134* 131* 131* 129*  K 3.9 4.1 4.8 5.1  CL 96* 95* 94* 93*  CO2 27 25 25 23   GLUCOSE 167* 244* 225* 190*  BUN 20 29* 34* 49*  CREATININE 4.31* 5.42* 5.93* 7.45*  CALCIUM 8.6* 8.5* 9.1 8.9  PROT 6.2*  --   --   --   ALBUMIN 3.6 3.1*  --   --   AST 85*  --   --   --   ALT 45*  --   --   --   ALKPHOS 78  --   --   --   BILITOT 0.9  --   --   --   GFRNONAA 11* 8* 7* 6*  ANIONGAP 11 11 12 13      Lipids No results for input(s): CHOL, TRIG,  HDL, LABVLDL, LDLCALC, CHOLHDL in the last 168 hours.  Hematology Recent Labs  Lab 09/09/21 0251 09/10/21 0119 09/11/21 0327  WBC 10.0 9.9 10.2  RBC 2.70* 2.64* 2.27*  HGB 9.2* 8.9* 8.0*  HCT 29.2* 28.6* 24.4*  MCV 108.1* 108.3* 107.5*  MCH 34.1* 33.7 35.2*  MCHC 31.5 31.1 32.8  RDW 14.6 14.6 14.4  PLT 170 173 149*    Thyroid No results for input(s): TSH, FREET4 in the last 168 hours.  BNP Recent Labs  Lab 09/09/21 0251  BNP 3,297.0*     DDimer No results for input(s): DDIMER in the last 168 hours.   Radiology    CARDIAC CATHETERIZATION  Result Date: 09/09/2021   Ost LM to Dist LM lesion is 60% stenosed.   Balloon angioplasty was performed using a BALLN SAPPHIRE [place] 3.5X8.   Post intervention, there is a 30% residual stenosis. Successful PCI of the left main into the LCx with Shockwave therapy and aggressive balloon angioplasty. Significant improvement in stent expansion. There is occlusion of the first OM but this vessel was already severely diseased. Plan: DAPT with ASA and Plavix indefinitely. Continue aggressive medical therapy for residual disease. Anticipate dialysis tomorrow.    Cardiac Studies   LHC 09/08/2021  Ost LM to Dist LM lesion is 50% stenosed.   Prox LAD to Mid LAD lesion is 100% stenosed.   Ost LAD to Prox LAD lesion is 100% stenosed.   Ost Cx to Mid Cx lesion is 40% stenosed.   Mid RCA to Dist RCA lesion is 30% stenosed.   Prox RCA lesion is 100% stenosed.   Origin lesion is 20% stenosed.   Mid Graft lesion is 15% stenosed.   Prox Graft-1 lesion is 15% stenosed.   Origin to Prox Graft lesion before 1st Mrg  is 100% stenosed.   Origin to Prox Graft lesion is 35% stenosed.   1st Mrg-1 lesion is 99% stenosed.   1st Mrg-2 lesion is 75% stenosed.   LPAV lesion is 80% stenosed.   Non-stenotic Prox Graft-2 lesion was previously treated.   SVG.   SVG due to known occlusion.   LIMA and is normal in caliber.   The graft exhibits moderate focal  disease.   LV end diastolic pressure is normal.   Severe 3 vessel obstructive CAD. Co  dominant circulation.  The prior angioplasty site of the left main and LCx is improved with 50% residual. This was 90% before.  Patent LIMA to the LAD. 30% in stent disease in the ostial LIMA Occluded SVG sequential to the OM1 and left PDA- chronic Patent SVG to the RCA. Stents with mild nonobstructive disease. Patent stent in the left subclavian. There is a 20 mm Hg pressure gradient. But the stent appears to be widely patent.  Normal LVEDP   Plan: would recommend continued medical therapy. There are no suitable targets for PCI and prior PCI sites are patent  Patient Profile     65 year old female with extensive revascularization history (obstructive RCA with diseased SVG, Obstructive LM with LIMA --> LAD and prior subclavian stent, occluded SVG --> OM1, LM 99% s/p POBA of ISR 08/26/21 with relook today for refractory angina with no intervention) now with recurrent chest pain, s/p LHC 09/08/21 with chest pain recurrent and diffuse ST depression, interventional now discussing taking her back to the lab  Assessment & Plan    1.  Non-STEMI: Complex coronary artery disease as above.  She is status post CABG in 2005 with multiple PCI's.  P.o. BA to the left main this admission.  She is not currently having chest pain.  Continue aspirin, Plavix, Ranexa, statin.  Intolerant to beta-blockers due to hypotension.  2.  ESRD: Plan for dialysis today.  Postdialysis, likely discharge.  3.  Hypotension: Occurs in the setting of dialysis.  Has midodrine ordered.  Holding off on long-acting nitrates due to hypotension.  4.  Hyperlipidemia: LDL was 21 on 08/27/2021.  Continue atorvastatin 80 mg, Zetia 10 mg daily.  5.  Type 2 diabetes: Continue prior management.   For questions or updates, please contact [place] Please consult [email or web address] for contact info under        Signed, Lene Mckay Meredith Leeds, MD   09/11/2021, 8:50 AM

## 2021-09-11 NOTE — Discharge Summary (Incomplete)
Discharge Summary    Patient ID: Donna Howe MRN: [id number]; DOB: 1956-07-02  Admit date: 09/08/2021 Discharge date: 09/12/2021  PCP:  Curlene Labrum, MD   [facility] Providers Cardiologist:  Rozann Lesches, MD   { Click here to update MD or APP on Care Team, Refresh:1}     Discharge Diagnoses    Principal Problem:   Unstable angina [facility]) Active Problems:   Mixed hyperlipidemia   Obesity (BMI 30-39.9)   CAD (coronary artery disease)   Hx of CABG   Diabetes mellitus with ESRD (end-stage renal disease) ([place])   ESRD (end stage renal disease) ([place])    Diagnostic Studies/Procedures    Cath 09/08/2021   Ost LM to Dist LM lesion is 50% stenosed.   Prox LAD to Mid LAD lesion is 100% stenosed.   Ost LAD to Prox LAD lesion is 100% stenosed.   Ost Cx to Mid Cx lesion is 40% stenosed.   Mid RCA to Dist RCA lesion is 30% stenosed.   Prox RCA lesion is 100% stenosed.   Origin lesion is 20% stenosed.   Mid Graft lesion is 15% stenosed.   Prox Graft-1 lesion is 15% stenosed.   Origin to Prox Graft lesion before 1st Mrg  is 100% stenosed.   Origin to Prox Graft lesion is 35% stenosed.   1st Mrg-1 lesion is 99% stenosed.   1st Mrg-2 lesion is 75% stenosed.   LPAV lesion is 80% stenosed.   Non-stenotic Prox Graft-2 lesion was previously treated.   SVG.   SVG due to known occlusion.   LIMA and is normal in caliber.   The graft exhibits moderate focal disease.   LV end diastolic pressure is normal.   Severe 3 vessel obstructive CAD. Co dominant circulation.  The prior angioplasty site of the left main and LCx is improved with 50% residual. This was 90% before.  Patent LIMA to the LAD. 30% in stent disease in the ostial LIMA Occluded SVG sequential to the OM1 and left PDA- chronic Patent SVG to the RCA. Stents with mild nonobstructive disease. Patent stent in the left subclavian. There is a 20 mm Hg pressure gradient. But the stent appears to be widely patent.   Normal LVEDP   Plan: would recommend continued medical therapy. There are no suitable targets for PCI and prior PCI sites are patent     Cath 09/09/2021   Ost LM to Dist LM lesion is 60% stenosed.   Balloon angioplasty was performed using a BALLN SAPPHIRE [place] 3.5X8.   Post intervention, there is a 30% residual stenosis.   Successful PCI of the left main into the LCx with Shockwave therapy and aggressive balloon angioplasty. Significant improvement in stent expansion. There is occlusion of the first OM but this vessel was already severely diseased.      Plan: DAPT with ASA and Plavix indefinitely. Continue aggressive medical therapy for residual disease. Anticipate dialysis tomorrow.    _____________   History of Present Illness     Donna Howe is a 65 y.o. female with past medical history of CAD (s/p CABG in 2005 with multiple PCI's since including PTCA to SVG-OM1 in 01/2004, PTCA/DES to LM and prox LCx in 03/2004 and DES to SVG-OM1, PTCA/DES to SVG-OM in 01/2011, PTCA/DES to LM and LCx in 2015, cutting balloon angioplasty to prox Cx and DES to SVG-RCA in 05/2017, NSTEMI in 03/2018 with DES to mid-RCA and DES to LIMA-LAD, NSTEMI in 06/2020 and medical management recommended as  she was felt to be high-risk for re-do CABG and poor target vessels, s/p NSTEMI in 12/2020 with DESx2 to SVG-RCA, NSTEMI in 05/2021 with ISR of stent in the SVG-RCA and treated with angioplasty, NSTEMI in 08/2021 with cath showing 90-95% ISR of LM/LCx stent and treated with angioplasty and reduced to 50%), HTN, HLD, Type 2 DM, carotid artery stenosis (s/p R CEA in 2014), left subclavian stenosis (s/p stent in 07/2020) and ESRD who is being seen for evaluation of chest pain at the request of Dr. Laverta Baltimore.   Donna Howe was recently admitted to [facility] from 12/9 - 08/28/2021 for an NSTEMI with troponins peaking at greater than 24,000. She underwent a repeat cardiac catheterization which showed 90-95% in-stent  restenosis of her LM/LCx stent and this was treated with moderately successful angioplasty reducing the stenosis to 50%. She did follow-up on 09/01/2021 and reported still having some intermittent chest pain since hospital discharge but symptoms had resolved with nitroglycerin x1. She was continued on ASA, Plavix, Atorvastatin 80 mg daily, Zetia 10 mg daily and Ranexa 500 mg daily. She had previously been intolerant to beta-blocker therapy, Imdur and Amlodipine due to hypotension which limited medical therapy for her angina.   In talking with the patient today, she reports having more frequent chest pain within the past week and has utilized an entire bottle of nitroglycerin. Reports taking a total of 3 yesterday but still had intermittent discomfort throughout the day. She had called in a refill to her pharmacy this morning but had significant pain at home which prompted her to come to the ED for evaluation instead. She did take 4-81mg  ASA at home with some improvement. Reports her pain is a tightness/pressure which radiates down her left arm.  She does report some dyspnea with exertion but no specific orthopnea, PND or pitting edema. She was scheduled for dialysis this morning but did not go to her session due to being in the ED. She is still having intermittent chest pain at this time and has received 3 NTG while in the ED.    Initial labs show WBC 14.1, Hgb 10.7, platelets 220, Na+ 134, K+ 4.2, glucose 246 and creatinine 6.29. COVID pending. CXR with no acute findings. EKG shows normal sinus rhythm, heart rate 94 with ST depression along the inferior and lateral leads which is more prominent when compared to prior tracings. Telemetry shows normal sinus rhythm but she is having frequent PVC's and sometimes in a trigeminal pattern.  Hospital Course     Consultants: Nephrology Dr. [name]  Patient was admitted to cardiology service with unstable angina.  Initial cardiac catheterization performed on  09/08/2021 showed a 50% ostial left main lesion, 100% proximal LAD occlusion, 40% ostial left circumflex lesion, 100% proximal RCA occlusion, 100% occluded sequential SVG to OM1 and left PDA, patent SVG to RCA but mild nonobstructive disease, patent stent in the left subclavian artery.  Medical therapy was recommended, no suitable target for PCI.  Postprocedure, patient had a recurrent chest discomfort.  Unfortunately she had further chest pain during hemodialysis.  Repeat EKG showed mild EKG changes including ST depression in the inferolateral leads.  Patient returned to the Cath Lab on 09/09/2021 which revealed 60% ostial to distal left main lesion treated with shockwave therapy and aggressive balloon angioplasty with resultant 30% residual disease.  Occluded first OM that was previously heavily diseased which is likely responsible for her chest pain, unfortunately this vessel is not salvageable.  Postprocedure, patient was continued on  aspirin and Plavix therapy with recommendation of indefinite dual antiplatelet therapy.  Post PCI, she continued to stay on nitro drip overnight.  Chest pain improved by the following day, IV nitroglycerin weaned off on 12/24.  She previously was intolerant of beta-blocker due to hypotension.  Patient was seen in the morning of 09/11/2021 by Dr. Allegra Lai at which point she has been doing well after balloon angioplasty of left main.  She will continue on aspirin, Plavix, Ranexa and statin.  She is scheduled to undergo hemodialysis this afternoon, postprocedure she can be discharged from the cardiac perspective.  She is on midodrine for hypotension.  We decided to hold off on long-acting nitrate due to hypotension.  Although Imdur 15 mg daily was ordered during this admission, this was discontinued prior to discharge due to hypotension after discussing with Dr. Curt Bears.  Per Dr. Curt Bears, may consider reinitiation of low-dose Imdur if her blood pressure stable on outpatient  follow-up.  She is currently scheduled to see our APP in the office in February, I have sent a staff message to our scheduler to arrange a sooner visit in the next 2 weeks.  ***Discharge held off due to severe hypotension, seen by Dr. Harrington Challenger, palliative care and PCCM consulted.    Did the patient have an acute coronary syndrome (MI, NSTEMI, STEMI, etc) this admission?:  Yes                               AHA/ACC Clinical Performance & Quality Measures: Aspirin prescribed? - Yes ADP Receptor Inhibitor (Plavix/Clopidogrel, Brilinta/Ticagrelor or Effient/Prasugrel) prescribed (includes medically managed patients)? - Yes Beta Blocker prescribed? - No - intolerant, hypotension High Intensity Statin (Lipitor 40-80mg  or Crestor 20-40mg ) prescribed? - Yes EF assessed during THIS hospitalization? - No - No echo done during this admission as echo was recently performed in Sept For EF <40%, was ACEI/ARB prescribed? - Not Applicable (EF >/= 74%) For EF <40%, Aldosterone Antagonist (Spironolactone or Eplerenone) prescribed? - Not Applicable (EF >/= 25%) Cardiac Rehab Phase II ordered (including medically managed patients)? - Yes       The patient will be scheduled for a TOC follow up appointment in 7-14 days.  A message has been sent to the [facility] and Scheduling Pool at the office where the patient should be seen for follow up.  _____________  Discharge Vitals Blood pressure (!) (P) 70/49, pulse (!) (P) 58, temperature 98.4 F (36.9 C), temperature source Oral, resp. rate (P) 19, height 5\' 5"  (1.651 m), weight 98.4 kg, SpO2 99 %.  Filed Weights   09/10/21 1342 09/11/21 0807 09/12/21 0534  Weight: 96.9 kg 97.2 kg 98.4 kg    Labs & Radiologic Studies    CBC Recent Labs    09/11/21 0327 09/12/21 0456  WBC 10.2 11.0*  HGB 8.0* 7.9*  HCT 24.4* 24.8*  MCV 107.5* 106.9*  PLT 149* 956   Basic Metabolic Panel Recent Labs    09/09/21 1806 09/10/21 0119 09/11/21 0327 09/12/21 0456  NA 131*    < > 129* 132*  K 4.1   < > 5.1 5.2*  CL 95*   < > 93* 96*  CO2 25   < > 23 21*  GLUCOSE 244*   < > 190* 215*  BUN 29*   < > 49* 64*  CREATININE 5.42*   < > 7.45* 8.39*  CALCIUM 8.5*   < > 8.9 9.0  PHOS 5.9*  --   --   --    < > =  values in this interval not displayed.   Liver Function Tests Recent Labs    09/09/21 1806  ALBUMIN 3.1*   No results for input(s): LIPASE, AMYLASE in the last 72 hours. High Sensitivity Troponin:   Recent Labs  Lab 09/08/21 1034 09/08/21 1210 09/09/21 0251 09/09/21 0631 09/09/21 2019  TROPONINIHS 134* 173* 1,641* 2,762* 7,276*    BNP Invalid input(s): POCBNP D-Dimer No results for input(s): DDIMER in the last 72 hours. Hemoglobin A1C No results for input(s): HGBA1C in the last 72 hours. Fasting Lipid Panel No results for input(s): CHOL, HDL, LDLCALC, TRIG, CHOLHDL, LDLDIRECT in the last 72 hours. Thyroid Function Tests No results for input(s): TSH, T4TOTAL, T3FREE, THYROIDAB in the last 72 hours.  Invalid input(s): FREET3 _____________  DG Chest 2 View  Result Date: 08/24/2021 CLINICAL DATA:  Shortness of breath EXAM: CHEST - 2 VIEW COMPARISON:  07/12/2021 FINDINGS: Post sternotomy changes. Right-sided central venous catheter with tip at the cavoatrial region. Cardiomegaly with vascular congestion and probable mild edema. No pleural effusion or pneumothorax. IMPRESSION: Cardiomegaly with vascular congestion and probable mild edema. Electronically Signed   By: Donavan Foil M.D.   On: 08/24/2021 22:05   CARDIAC CATHETERIZATION  Result Date: 09/09/2021   Ost LM to Dist LM lesion is 60% stenosed.   Balloon angioplasty was performed using a BALLN SAPPHIRE [place] 3.5X8.   Post intervention, there is a 30% residual stenosis. Successful PCI of the left main into the LCx with Shockwave therapy and aggressive balloon angioplasty. Significant improvement in stent expansion. There is occlusion of the first OM but this vessel was already severely diseased.  Plan: DAPT with ASA and Plavix indefinitely. Continue aggressive medical therapy for residual disease. Anticipate dialysis tomorrow.   CARDIAC CATHETERIZATION  Result Date: 09/09/2021   Ost LM to Dist LM lesion is 50% stenosed.   Prox LAD to Mid LAD lesion is 100% stenosed.   Ost LAD to Prox LAD lesion is 100% stenosed.   Ost Cx to Mid Cx lesion is 40% stenosed.   Mid RCA to Dist RCA lesion is 30% stenosed.   Prox RCA lesion is 100% stenosed.   Origin lesion is 20% stenosed.   Mid Graft lesion is 15% stenosed.   Prox Graft-1 lesion is 15% stenosed.   Origin to Prox Graft lesion before 1st Mrg  is 100% stenosed.   Origin to Prox Graft lesion is 35% stenosed.   1st Mrg-1 lesion is 99% stenosed.   1st Mrg-2 lesion is 75% stenosed.   LPAV lesion is 80% stenosed.   Non-stenotic Prox Graft-2 lesion was previously treated.   SVG.   SVG due to known occlusion.   LIMA and is normal in caliber.   The graft exhibits moderate focal disease.   LV end diastolic pressure is normal. Severe 3 vessel obstructive CAD. Co dominant circulation.  The prior angioplasty site of the left main and LCx is improved with 50% residual. This was 90% before. Patent LIMA to the LAD. 30% in stent disease in the ostial LIMA Occluded SVG sequential to the OM1 and left PDA- chronic Patent SVG to the RCA. Stents with mild nonobstructive disease. Patent stent in the left subclavian. There is a 20 mm Hg pressure gradient. But the stent appears to be widely patent. Normal LVEDP Plan: would recommend continued medical therapy. There are no suitable targets for PCI and prior PCI sites are patent  CARDIAC CATHETERIZATION  Result Date: 08/26/2021   Ost LAD to Prox LAD lesion is  100% stenosed.  Prox LAD to Mid LAD lesion is 100% stenosed.   Prox RCA lesion is 100% stenosed.  LPAV lesion is 80% stenosed.   1st Mrg-1 lesion is 99% stenosed.  1st Mrg-2 lesion is 75% stenosed.   -----------------------   [name] to Dist LM lesion is 95% stenosed.  Ost Cx to  Mid Cx lesion is 99% stenosed.   Balloon angioplasty was performed using a BALLN SAPPHIRE [place] 3.0X12.   Post intervention, there is a 40% residual stenosis in the ostial to mid LCx, and 50% residual stenosis in the ostial LM-distal LM.Marland Kitchen   LIMA-LAD graft was visualized by angiography and is normal in caliber.  Origin to Prox Graft stent is 35% stenosed.   SVG-rPDA:  Ostial stent is 20% stenosed.  Prox Graft-1 proximal stent is 15% stenosed.  Mid Graft stent is 15% stenosed.  Otherwise mild diffuse disease in the both the stented and nondistended portions of the graft.   SVG-OM graft was not visualized due to known occlusion.  Origin to Prox Graft lesion before 1st Mrg  is 100% stenosed.   ---------- HEMODYNAMICS ----------   There is moderate left ventricular systolic dysfunction.   The left ventricular ejection fraction is 35-45% by visual estimate.   There is no aortic valve stenosis. SUMMARY Known severe native vessel disease: Known RCA CTO (not imaged), known LAD CTO.  Also known occluded SVG-OM LM-LCx stent 90 to 95% ISR (likely culprit lesion) with 90%/stable ostial small caliber OM branch.  LCx is a large likely codominant vessel.. (Moderately successful) Urania balloon PTCA of LM-LCx ISR using 3.0 mm [place] balloon further attempted balloon inflations were aborted due to patient having significant anginal pain and blood pressure drops with inflations) -> stenosis reduced to 50%.  TIMI-3 flow 35% ISR of ostial LIMA graft Mild 20% ISR throughout ostial-proximal SVG-PDA graft 10 to 15 mmHg gradient through ostial L-Subclavian Artery Stent Moderate LVEDP with moderate reduced EF of 40 to 45% global HK.  (Recommend echocardiogram to better assess) Recommendations: Aggressive risk factor modification, if symptoms worsen, would then need to become more aggressive with the Left Main stent post dilation. [name], MD .   DG Chest Port 1 View  Result Date: 09/08/2021 CLINICAL DATA:  Chest pain EXAM: PORTABLE CHEST 1  VIEW COMPARISON:  08/26/2021 FINDINGS: Mild cardiac enlargement. Negative for heart failure. Postop median sternotomy. Stent in the proximal left carotid or subclavian artery. Right jugular sent venous catheter tip in the right atrium unchanged. No pneumothorax Negative for edema.  No infiltrate or effusion. IMPRESSION: No acute abnormality. Improvement in vascular congestion and interstitial edema. Electronically Signed   By: Franchot Gallo M.D.   On: 09/08/2021 11:04   DG Chest Port 1 View  Result Date: 08/26/2021 CLINICAL DATA:  Chest pain and shortness of breath EXAM: PORTABLE CHEST 1 VIEW COMPARISON:  08/25/2021 FINDINGS: Cardiac shadow is stable. Postsurgical changes are noted. Dialysis catheter is noted. Increased vascular congestion and interstitial edema is noted when compared with the previous day likely related to volume overload. No focal infiltrate is seen. Left subclavian stent is again noted. IMPRESSION: Increased vascular congestion and interstitial edema consistent with volume overload. Electronically Signed   By: Inez Catalina M.D.   On: 08/26/2021 02:23   DG Chest Port 1 View  Result Date: 08/25/2021 CLINICAL DATA:  65 year old female with shortness of breath. EXAM: PORTABLE CHEST - 1 VIEW COMPARISON:  08/24/2021, 07/14/2021 FINDINGS: The mediastinal contours are within normal limits. Unchanged cardiomegaly.  Atherosclerotic calcification of the aortic arch. Unchanged position of indwelling right IJ tunneled hemodialysis catheter with the catheter tip near the cavoatrial junction. Unchanged appearance of left subclavian ostial stent. Similar appearing mild cephalization of pulmonary vasculature. Mild hazy bibasilar opacities, slightly increased from comparison. No pleural effusion or pneumothorax. No acute osseous abnormality. Median sternotomy wires appear intact. IMPRESSION: 1. Slightly worsened mild pulmonary edema. 2. Unchanged cardiomegaly. 3.  Aortic Atherosclerosis (ICD10-I70.0).  Electronically Signed   By: Ruthann Cancer M.D.   On: 08/25/2021 07:58   Disposition   Pt is being discharged home today in good condition.  Follow-up Plans & Appointments     Follow-up Information     Satira Sark, MD Follow up.   Specialty: Cardiology Why: office scheduler will contact you to arrange a sooner follow up, please contact us if you have not heard from our scheduler in 3 business days. Contact information: [place] [address] [phone]                Discharge Instructions     Amb Referral to Cardiac Rehabilitation   Complete by: As directed    Diagnosis: PTCA   After initial evaluation and assessments completed: Virtual Based Care may be provided alone or in conjunction with Phase 2 Cardiac Rehab based on patient barriers.: Yes       Discharge Medications   Allergies as of 09/12/2021       Reactions   Penicillins Other (See Comments)   REACTION: Unknown, told as a child Has patient had a PCN reaction causing immediate rash, facial/tongue/throat swelling, SOB or lightheadedness with hypotension: Unknown Has patient had a PCN reaction causing severe rash involving mucus membranes or skin necrosis: Unknown Has patient had a PCN reaction that required hospitalization: Unknown Has patient had a PCN reaction occurring within the last 10 years: No If all of the above answers are "NO", then may proceed with Cephalosporin use.   Beta Adrenergic Blockers Other (See Comments)   Hypotension   Brilinta [ticagrelor]    Shortness of Breath        Medication List     STOP taking these medications    cyclobenzaprine 5 MG tablet Commonly known as: FLEXERIL       TAKE these medications    Albuterol Sulfate 108 (90 Base) MCG/ACT Aepb Commonly known as: PROAIR RESPICLICK Inhale 2 puffs into the lungs every 6 (six) hours as needed (Shortness of breath).   albuterol (2.5 MG/3ML) 0.083% nebulizer solution Commonly known as:  PROVENTIL Take 3 mLs (2.5 mg total) by nebulization every 2 (two) hours as needed for wheezing.   allopurinol 100 MG tablet Commonly known as: Zyloprim Take one tablet after hemodialysis, on Tuesday, Thursday and Saturday. What changed:  how much to take how to take this when to take this additional instructions   ALPRAZolam 0.5 MG tablet Commonly known as: XANAX Take 0.25-0.5 mg by mouth See admin instructions. Take 0.25 in the morning and evening and 0.5 mg at bedtime   aspirin EC 81 MG tablet Take 81 mg by mouth daily.   atorvastatin 80 MG tablet Commonly known as: LIPITOR Take 1 tablet (80 mg total) by mouth every evening. What changed: when to take this   budesonide-formoterol 80-4.5 MCG/ACT inhaler Commonly known as: Symbicort Take 2 puffs first thing in am and then another 2 puffs about 12 hours later.   clopidogrel 75 MG tablet Commonly known as: PLAVIX Take 1 tablet (75 mg total)  by mouth daily.   diclofenac Sodium 1 % Gel Commonly known as: VOLTAREN Apply 2 g topically daily as needed (Pain).   ezetimibe 10 MG tablet Commonly known as: ZETIA Take 1 tablet (10 mg total) by mouth daily. What changed: when to take this   ferric citrate 1 GM 210 MG(Fe) tablet Commonly known as: AURYXIA Take 420 mg by mouth 3 (three) times daily with meals.   gabapentin 400 MG capsule Commonly known as: NEURONTIN Take 1 capsule (400 mg total) by mouth at bedtime.   ipratropium 17 MCG/ACT inhaler Commonly known as: ATROVENT HFA Inhale 2 puffs into the lungs every 4 (four) hours as needed for wheezing.   levothyroxine 125 MCG tablet Commonly known as: SYNTHROID Take 125 mcg by mouth daily before breakfast.   midodrine 10 MG tablet Commonly known as: PROAMATINE Take 1 tablet (10 mg total) by mouth 2 (two) times daily with a meal.   MIRCERA IJ Mircera   multivitamin Tabs tablet Take 1 tablet by mouth at bedtime.   nitroGLYCERIN 0.4 MG SL tablet Commonly known as:  NITROSTAT Place 1 tablet (0.4 mg total) under the tongue every 5 (five) minutes x 3 doses as needed for chest pain.   NovoLIN 70/30 Kwikpen (70-30) 100 UNIT/ML KwikPen Generic drug: insulin isophane & regular human KwikPen Inject 35 Units into the skin in the morning and at bedtime. What changed: how much to take   ondansetron 8 MG tablet Commonly known as: ZOFRAN Take 8 mg by mouth every 8 (eight) hours as needed for nausea.   OneTouch Ultra test strip Generic drug: glucose blood Check blood glucose THREE TIMES DAILY   pantoprazole 40 MG tablet Commonly known as: PROTONIX Take 1 tablet (40 mg total) by mouth 2 (two) times daily.   ranolazine 500 MG 12 hr tablet Commonly known as: RANEXA Take 1 tablet (500 mg total) by mouth at bedtime.   VENOFER IV Inject 50 mLs into the vein once a week. At dialysis   Vitamin D (Ergocalciferol) 1.25 MG (50000 UNIT) Caps capsule Commonly known as: DRISDOL Take 50,000 Units by mouth every Sunday.           Outstanding Labs/Studies   N/A  Duration of Discharge Encounter   Greater than 30 minutes including physician time.  Hilbert Corrigan, PA 09/12/2021, 8:54 AM

## 2021-09-11 NOTE — Progress Notes (Signed)
Pt BP 71/41 MAP 51; informed at bedside report that pt was scheduled for HD this evening. Pt exclaims that she has been lethargic all day; A&Ox 4. Paticia Stack, MD made aware. Will continue to monitor.   Elaina Hoops, RN

## 2021-09-12 ENCOUNTER — Inpatient Hospital Stay (HOSPITAL_COMMUNITY): Payer: Medicare HMO

## 2021-09-12 DIAGNOSIS — I200 Unstable angina: Secondary | ICD-10-CM | POA: Diagnosis not present

## 2021-09-12 DIAGNOSIS — R079 Chest pain, unspecified: Secondary | ICD-10-CM | POA: Diagnosis not present

## 2021-09-12 LAB — CBC
HCT: 24.8 % — ABNORMAL LOW (ref 36.0–46.0)
HCT: 25.4 % — ABNORMAL LOW (ref 36.0–46.0)
Hemoglobin: 7.9 g/dL — ABNORMAL LOW (ref 12.0–15.0)
Hemoglobin: 8.3 g/dL — ABNORMAL LOW (ref 12.0–15.0)
MCH: 34.1 pg — ABNORMAL HIGH (ref 26.0–34.0)
MCH: 35.5 pg — ABNORMAL HIGH (ref 26.0–34.0)
MCHC: 31.9 g/dL (ref 30.0–36.0)
MCHC: 32.7 g/dL (ref 30.0–36.0)
MCV: 106.9 fL — ABNORMAL HIGH (ref 80.0–100.0)
MCV: 108.5 fL — ABNORMAL HIGH (ref 80.0–100.0)
Platelets: 153 10*3/uL (ref 150–400)
Platelets: 199 10*3/uL (ref 150–400)
RBC: 2.32 MIL/uL — ABNORMAL LOW (ref 3.87–5.11)
RBC: 2.34 MIL/uL — ABNORMAL LOW (ref 3.87–5.11)
RDW: 14.6 % (ref 11.5–15.5)
RDW: 14.9 % (ref 11.5–15.5)
WBC: 11 10*3/uL — ABNORMAL HIGH (ref 4.0–10.5)
WBC: 14.8 10*3/uL — ABNORMAL HIGH (ref 4.0–10.5)
nRBC: 0.5 % — ABNORMAL HIGH (ref 0.0–0.2)
nRBC: 0.7 % — ABNORMAL HIGH (ref 0.0–0.2)

## 2021-09-12 LAB — ECHOCARDIOGRAM LIMITED
Height: 65 in
S' Lateral: 5.04 cm
Weight: 3470.92 oz

## 2021-09-12 LAB — GLUCOSE, CAPILLARY
Glucose-Capillary: 157 mg/dL — ABNORMAL HIGH (ref 70–99)
Glucose-Capillary: 215 mg/dL — ABNORMAL HIGH (ref 70–99)
Glucose-Capillary: 278 mg/dL — ABNORMAL HIGH (ref 70–99)
Glucose-Capillary: 251 mg/dL — ABNORMAL HIGH (ref 70–99)
Glucose-Capillary: 231 mg/dL — ABNORMAL HIGH (ref 70–99)

## 2021-09-12 LAB — BASIC METABOLIC PANEL
Anion gap: 15 (ref 5–15)
BUN: 64 mg/dL — ABNORMAL HIGH (ref 8–23)
CO2: 21 mmol/L — ABNORMAL LOW (ref 22–32)
Calcium: 9 mg/dL (ref 8.9–10.3)
Chloride: 96 mmol/L — ABNORMAL LOW (ref 98–111)
Creatinine, Ser: 8.39 mg/dL — ABNORMAL HIGH (ref 0.44–1.00)
GFR, Estimated: 5 mL/min — ABNORMAL LOW (ref >60)
Glucose, Bld: 215 mg/dL — ABNORMAL HIGH (ref 70–99)
Potassium: 5.2 mmol/L — ABNORMAL HIGH (ref 3.5–5.1)
Sodium: 132 mmol/L — ABNORMAL LOW (ref 135–145)

## 2021-09-12 LAB — LACTIC ACID, PLASMA: Lactic Acid, Venous: 2.4 mmol/L (ref 0.5–1.9)

## 2021-09-12 LAB — TYPE AND SCREEN
ABO/RH(D): A POS
Antibody Screen: NEGATIVE

## 2021-09-12 MED ORDER — INSULIN ASPART PROT & ASPART (70-30 MIX) 100 UNIT/ML ~~LOC~~ SUSP
28.0000 [IU] | Freq: Two times a day (BID) | SUBCUTANEOUS | Status: DC
Start: 2021-09-12 — End: 2021-09-12
  Filled 2021-09-12: qty 10

## 2021-09-12 MED ORDER — MIDODRINE HCL 5 MG PO TABS
10.0000 mg | ORAL_TABLET | ORAL | Status: DC
  Administered 2021-09-13 – 2021-09-15 (×4): 10 mg via ORAL
  Filled 2021-09-12 (×5): qty 2

## 2021-09-12 MED ORDER — NALOXONE HCL 0.4 MG/ML IJ SOLN
INTRAMUSCULAR | Status: AC
  Administered 2021-09-12: 12:00:00 0.4 mg
  Filled 2021-09-12: qty 1

## 2021-09-12 MED ORDER — CHLORHEXIDINE GLUCONATE CLOTH 2 % EX PADS
6.0000 | MEDICATED_PAD | Freq: Every day | CUTANEOUS | Status: DC
  Administered 2021-09-17 – 2021-09-20 (×2): 6 via TOPICAL

## 2021-09-12 MED ORDER — PANTOPRAZOLE SODIUM 40 MG IV SOLR
40.0000 mg | Freq: Two times a day (BID) | INTRAVENOUS | Status: DC
Start: 2021-09-12 — End: 2021-09-14
  Administered 2021-09-13 – 2021-09-14 (×2): 40 mg via INTRAVENOUS
  Filled 2021-09-12: qty 40

## 2021-09-12 MED ORDER — SODIUM CHLORIDE 0.9 % IV BOLUS
250.0000 mL | Freq: Once | INTRAVENOUS | Status: AC
  Administered 2021-09-12: 02:00:00 250 mL via INTRAVENOUS

## 2021-09-12 MED ORDER — SODIUM CHLORIDE 0.9 % IV BOLUS
250.0000 mL | Freq: Once | INTRAVENOUS | Status: AC
  Administered 2021-09-12: 01:00:00 250 mL via INTRAVENOUS

## 2021-09-12 MED ORDER — METRONIDAZOLE 500 MG/100ML IV SOLN
500.0000 mg | Freq: Two times a day (BID) | INTRAVENOUS | Status: DC
  Administered 2021-09-12 – 2021-09-17 (×10): 500 mg via INTRAVENOUS
  Filled 2021-09-12 (×10): qty 100

## 2021-09-12 MED ORDER — ALBUMIN HUMAN 25 % IV SOLN
INTRAVENOUS | Status: AC
  Administered 2021-09-12: 08:00:00 25 g
  Filled 2021-09-12: qty 100

## 2021-09-12 MED ORDER — NOREPINEPHRINE 4 MG/250ML-% IV SOLN
0.0000 ug/min | INTRAVENOUS | Status: DC
Start: 2021-09-12 — End: 2021-09-12
  Filled 2021-09-12: qty 250

## 2021-09-12 MED ORDER — SODIUM CHLORIDE 0.9 % IV SOLN
250.0000 mL | INTRAVENOUS | Status: DC
  Administered 2021-09-14 – 2021-09-16 (×3): 250 mL via INTRAVENOUS

## 2021-09-12 MED ORDER — NOREPINEPHRINE 4 MG/250ML-% IV SOLN
2.0000 ug/min | INTRAVENOUS | Status: DC
  Administered 2021-09-12: 12:00:00 5 ug/min via INTRAVENOUS
  Administered 2021-09-12: 20:00:00 2 ug/min via INTRAVENOUS
  Filled 2021-09-12: qty 250

## 2021-09-12 MED ORDER — EPINEPHRINE 1 MG/10ML IJ SOSY
PREFILLED_SYRINGE | INTRAMUSCULAR | Status: AC
  Filled 2021-09-12: qty 20

## 2021-09-12 MED ORDER — SODIUM CHLORIDE 0.9 % IV SOLN
2.0000 g | INTRAVENOUS | Status: DC
  Administered 2021-09-12 – 2021-09-16 (×5): 2 g via INTRAVENOUS
  Filled 2021-09-12 (×6): qty 20

## 2021-09-12 MED ORDER — PANTOPRAZOLE SODIUM 40 MG IV SOLR
40.0000 mg | Freq: Two times a day (BID) | INTRAVENOUS | Status: DC
  Administered 2021-09-12 – 2021-09-17 (×8): 40 mg via INTRAVENOUS
  Filled 2021-09-12 (×9): qty 40

## 2021-09-12 NOTE — Consult Note (Signed)
CC/Reason for consult: Possible duodenal ulcer  Requesting provider: Dr. Kipp Brood  HPI: Xiao Graul Grime is an 65 y.o. female with significant medical hx - HTN, HLD, DM, substantial CAD/revascularizations now with severe mitral regurg, ESRD - now being evaluated for refractory angina - developed some abdominal discomfort that she reports to be left lateral abdomen and in her back.  She specifically denies any right-sided abdominal pain.  She does fatigue quickly and within a couple minutes of our encounter was back asleep in bed, limiting much other history taking. Per nursing she did have some nausea/vomiting earlier.   No fevers in last 24 hrs  She has been fairly significantly hypotensive x 36 hrs now. Currently with arterial line and levophed running  Hgb has been stable  CT A/P obtained this afternoon to evaluate for possibility of RP hematoma. This demonstrated some inflammation in RUQ adjacent to stomach/proximal duodenum. Concern for possible extraluminal gas adjacent to descending duodenum. No free fluid or free air. Constellation could raise possibility for peptic ulcer disease with possible perforated ulcer  We were asked to see following  Currently denies abdominal pain at present but does not prior left lateral/left flank pain.Marland Kitchen Specifically none on right.  Past Medical History:  Diagnosis Date   Anemia    Anxiety    Asthma    CAD (coronary artery disease)    Multivessel s/p CABG 2005, numerous PCIs since that time and documented graft disease   Carotid artery disease ([place])    R CEA   ESRD on hemodialysis ([place])    Essential hypertension    Gout    HFmrEF (heart failure with mildly reduced EF)    EF ~ 40 by LV gram at cath 08/26/2021   History of blood transfusion    Hyperlipidemia    Hypothyroidism    Myocardial infarction [facility])    PAD (peripheral artery disease) ([place])    Dr. Kellie Simmering   Pneumonia 09/2019, 11/2019   S/P angioplasty with stent- DES to mRCA and  to LIMA to LAD with DES 04/09/18.   04/10/2018   SBO (small bowel obstruction) ([place]) 2011   Status post lysis of adhesions & hernia repair   Sinus bradycardia    Type 2 diabetes mellitus ([place])    Umbilical hernia     Past Surgical History:  Procedure Laterality Date   AORTIC ARCH ANGIOGRAPHY N/A 08/02/2020   Procedure: AORTIC ARCH ANGIOGRAPHY;  Surgeon: Waynetta Sandy, MD;  Location: [place] CV LAB;  Service: Cardiovascular;  Laterality: N/A;  Lt upper extermity   AV FISTULA PLACEMENT Left 06/29/2020   Procedure: LEFT ARM ARTERIOVENOUS (AV) FISTULA;  Surgeon: Waynetta Sandy, MD;  Location: [place];  Service: Vascular;  Laterality: Left;  ARM   AV FISTULA PLACEMENT Right 05/11/2021   Procedure: RIGHT BRACHIOCEPHALIC ARTERIOVENOUS (AV) FISTULA CREATION;  Surgeon: Waynetta Sandy, MD;  Location: [facility] OR;  Service: Vascular;  Laterality: Right;   [place]  2010   CORONARY ARTERY BYPASS GRAFT  2005   CORONARY BALLOON ANGIOPLASTY N/A 05/31/2017   Procedure: CORONARY BALLOON ANGIOPLASTY;  Surgeon: Jettie Booze, MD;  Location: [facility] LAB;  Service: Cardiovascular;  Laterality: N/A;   CORONARY BALLOON ANGIOPLASTY N/A 06/14/2021   Procedure: CORONARY BALLOON ANGIOPLASTY;  Surgeon: Jettie Booze, MD;  Location: [place] CV LAB;  Service: Cardiovascular;  Laterality: N/A;   CORONARY BALLOON ANGIOPLASTY N/A 08/26/2021   Procedure: CORONARY BALLOON ANGIOPLASTY;  Surgeon: Ellyn Hack,  Leonie Green, MD;  Location: [place] CV LAB;  Service: Cardiovascular;  Laterality: N/A;   CORONARY BALLOON ANGIOPLASTY N/A 09/09/2021   Procedure: CORONARY BALLOON ANGIOPLASTY;  Surgeon: Martinique, Peter M, MD;  Location: [place] CV LAB;  Service: Cardiovascular;  Laterality: N/A;   CORONARY STENT INTERVENTION N/A 05/31/2017   Procedure: CORONARY STENT INTERVENTION;  Surgeon: Jettie Booze, MD;  Location: [place] CV LAB;  Service:  Cardiovascular;  Laterality: N/A;   CORONARY STENT INTERVENTION N/A 04/09/2018   Procedure: CORONARY STENT INTERVENTION;  Surgeon: Jettie Booze, MD;  Location: [place] CV LAB;  Service: Cardiovascular;  Laterality: N/A;  SVG RCA   CORONARY STENT INTERVENTION N/A 01/10/2021   Procedure: CORONARY STENT INTERVENTION;  Surgeon: Leonie Man, MD;  Location: [place] CV LAB;  Service: Cardiovascular;  Laterality: N/A;   CORONARY/GRAFT ACUTE MI REVASCULARIZATION N/A 02/24/2021   Procedure: Coronary/Graft Acute MI Revascularization;  Surgeon: Jettie Booze, MD;  Location: [place] CV LAB;  Service: Cardiovascular;  Laterality: N/A;   ENDARTERECTOMY Right 04/18/2013   Procedure: ENDARTERECTOMY CAROTID;  Surgeon: Mal Misty, MD;  Location: [place];  Service: Vascular;  Laterality: Right;   HERNIA REPAIR  1989   Incisional hernia repair x2  03/04/2010   Laparoscopic with 35cm mesh by Dr Ronnald Collum   IR [place] CV LINE RIGHT  06/21/2020   IR US GUIDE VASC ACCESS RIGHT  06/21/2020   LEFT HEART CATH AND CORS/GRAFTS ANGIOGRAPHY N/A 05/31/2017   Procedure: LEFT HEART CATH AND CORS/GRAFTS ANGIOGRAPHY;  Surgeon: Jettie Booze, MD;  Location: [place] CV LAB;  Service: Cardiovascular;  Laterality: N/A;   LEFT HEART CATH AND CORS/GRAFTS ANGIOGRAPHY N/A 04/08/2018   Procedure: LEFT HEART CATH AND CORS/GRAFTS ANGIOGRAPHY;  Surgeon: Jettie Booze, MD;  Location: [place] CV LAB;  Service: Cardiovascular;  Laterality: N/A;   LEFT HEART CATH AND CORS/GRAFTS ANGIOGRAPHY N/A 06/22/2020   Procedure: LEFT HEART CATH AND CORS/GRAFTS ANGIOGRAPHY;  Surgeon: Belva Crome, MD;  Location: [place] CV LAB;  Service: Cardiovascular;  Laterality: N/A;   LEFT HEART CATH AND CORS/GRAFTS ANGIOGRAPHY N/A 01/10/2021   Procedure: LEFT HEART CATH AND CORS/GRAFTS ANGIOGRAPHY;  Surgeon: Leonie Man, MD;  Location: [place] CV LAB;  Service: Cardiovascular;  Laterality: N/A;   LEFT  HEART CATH AND CORS/GRAFTS ANGIOGRAPHY N/A 02/24/2021   Procedure: LEFT HEART CATH AND CORS/GRAFTS ANGIOGRAPHY;  Surgeon: Jettie Booze, MD;  Location: [place] CV LAB;  Service: Cardiovascular;  Laterality: N/A;   LEFT HEART CATH AND CORS/GRAFTS ANGIOGRAPHY N/A 06/14/2021   Procedure: LEFT HEART CATH AND CORS/GRAFTS ANGIOGRAPHY;  Surgeon: Jettie Booze, MD;  Location: [place] CV LAB;  Service: Cardiovascular;  Laterality: N/A;   LEFT HEART CATH AND CORS/GRAFTS ANGIOGRAPHY N/A 08/26/2021   Procedure: LEFT HEART CATH AND CORS/GRAFTS ANGIOGRAPHY;  Surgeon: Leonie Man, MD;  Location: [place] CV LAB;  Service: Cardiovascular;  Laterality: N/A;   LEFT HEART CATH AND CORS/GRAFTS ANGIOGRAPHY N/A 09/08/2021   Procedure: LEFT HEART CATH AND CORS/GRAFTS ANGIOGRAPHY;  Surgeon: Martinique, Peter M, MD;  Location: [place] CV LAB;  Service: Cardiovascular;  Laterality: N/A;   LEFT HEART CATHETERIZATION WITH CORONARY ANGIOGRAM N/A 12/19/2012   Procedure: LEFT HEART CATHETERIZATION WITH CORONARY ANGIOGRAM;  Surgeon: Josue Hector, MD;  Location: [facility] CATH LAB;  Service: Cardiovascular;  Laterality: N/A;   LEFT HEART CATHETERIZATION WITH CORONARY/GRAFT ANGIOGRAM N/A 04/19/2013   Procedure: LEFT HEART CATHETERIZATION WITH CORONARY/GRAFT ANGIOGRAM;  Surgeon:  Lorretta Harp, MD;  Location: [facility] CATH LAB;  Service: Cardiovascular;  Laterality: N/A;   LIGATION OF ARTERIOVENOUS  FISTULA Left 09/15/2020   Procedure: LIGATION OF LEFT ARM ARTERIOVENOUS  FISTULA;  Surgeon: Waynetta Sandy, MD;  Location: [place];  Service: Vascular;  Laterality: Left;   PATCH ANGIOPLASTY Right 04/18/2013   Procedure: PATCH ANGIOPLASTY Right Internal Carotid Artery;  Surgeon: Mal Misty, MD;  Location: [place];  Service: Vascular;  Laterality: Right;   PERCUTANEOUS CORONARY STENT INTERVENTION (PCI-S) Right 12/19/2012   Procedure: PERCUTANEOUS CORONARY STENT INTERVENTION (PCI-S);  Surgeon: Josue Hector, MD;   Location: [facility] CATH LAB;  Service: Cardiovascular;  Laterality: Right;   PERIPHERAL VASCULAR INTERVENTION Left 08/02/2020   Procedure: PERIPHERAL VASCULAR INTERVENTION;  Surgeon: Waynetta Sandy, MD;  Location: [place] CV LAB;  Service: Cardiovascular;  Laterality: Left;  Left subclavian   PERIPHERAL VASCULAR INTERVENTION  02/24/2021   Procedure: PERIPHERAL VASCULAR INTERVENTION;  Surgeon: Marty Heck, MD;  Location: [place] CV LAB;  Service: Vascular;;   SHOULDER SURGERY      Family History  Problem Relation Age of Onset   Diabetes Mother    Heart disease Mother        before age 81   Hyperlipidemia Mother    Hypertension Mother    Thyroid disease Father    Hypertension Father    AAA (abdominal aortic aneurysm) Father    Heart disease Brother        before age 67   Hypertension Brother    Hyperlipidemia Son    Hypertension Son     Social:  reports that she quit smoking about 8 years ago. Her smoking use included cigarettes. She has a 20.00 pack-year smoking history. She has never used smokeless tobacco. She reports that she does not drink alcohol and does not use drugs.  Allergies:  Allergies  Allergen Reactions   Penicillins Other (See Comments)    REACTION: Unknown, told as a child Has patient had a PCN reaction causing immediate rash, facial/tongue/throat swelling, SOB or lightheadedness with hypotension: Unknown Has patient had a PCN reaction causing severe rash involving mucus membranes or skin necrosis: Unknown Has patient had a PCN reaction that required hospitalization: Unknown Has patient had a PCN reaction occurring within the last 10 years: No If all of the above answers are "NO", then may proceed with Cephalosporin use.    Beta Adrenergic Blockers Other (See Comments)    Hypotension   Brilinta [Ticagrelor]     Shortness of Breath    Medications: I have reviewed the patient's current medications.  Results for orders placed or performed  during the hospital encounter of 09/08/21 (from the past 48 hour(s))  Glucose, capillary     Status: Abnormal   Collection Time: 09/10/21  4:55 PM  Result Value Ref Range   Glucose-Capillary 156 (H) 70 - 99 mg/dL    Comment: Glucose reference range applies only to samples taken after fasting for at least 8 hours.  Glucose, capillary     Status: Abnormal   Collection Time: 09/10/21  8:10 PM  Result Value Ref Range   Glucose-Capillary 104 (H) 70 - 99 mg/dL    Comment: Glucose reference range applies only to samples taken after fasting for at least 8 hours.  CBC     Status: Abnormal   Collection Time: 09/11/21  3:27 AM  Result Value Ref Range   WBC 10.2 4.0 - 10.5 K/uL   RBC 2.27 (L) 3.87 -  5.11 MIL/uL   Hemoglobin 8.0 (L) 12.0 - 15.0 g/dL   HCT 24.4 (L) 36.0 - 46.0 %   MCV 107.5 (H) 80.0 - 100.0 fL   MCH 35.2 (H) 26.0 - 34.0 pg   MCHC 32.8 30.0 - 36.0 g/dL   RDW 14.4 11.5 - 15.5 %   Platelets 149 (L) 150 - 400 K/uL   nRBC 0.4 (H) 0.0 - 0.2 %    Comment: Performed at [facility] [address], [place], [place] [address]  Basic metabolic panel     Status: Abnormal   Collection Time: 09/11/21  3:27 AM  Result Value Ref Range   Sodium 129 (L) 135 - 145 mmol/L   Potassium 5.1 3.5 - 5.1 mmol/L   Chloride 93 (L) 98 - 111 mmol/L   CO2 23 22 - 32 mmol/L   Glucose, Bld 190 (H) 70 - 99 mg/dL    Comment: Glucose reference range applies only to samples taken after fasting for at least 8 hours.   BUN 49 (H) 8 - 23 mg/dL   Creatinine, Ser 7.45 (H) 0.44 - 1.00 mg/dL   Calcium 8.9 8.9 - 10.3 mg/dL   GFR, Estimated 6 (L) >60 mL/min    Comment: (NOTE) Calculated using the CKD-EPI Creatinine Equation (2021)    Anion gap 13 5 - 15    Comment: Performed at [facility] [address], [place], [place] [address]  Glucose, capillary     Status: Abnormal   Collection Time: 09/11/21  9:36 AM  Result Value Ref Range   Glucose-Capillary 278 (H) 70 - 99 mg/dL    Comment: Glucose  reference range applies only to samples taken after fasting for at least 8 hours.  Glucose, capillary     Status: Abnormal   Collection Time: 09/11/21 11:22 AM  Result Value Ref Range   Glucose-Capillary 260 (H) 70 - 99 mg/dL    Comment: Glucose reference range applies only to samples taken after fasting for at least 8 hours.  Glucose, capillary     Status: Abnormal   Collection Time: 09/11/21  2:26 PM  Result Value Ref Range   Glucose-Capillary 156 (H) 70 - 99 mg/dL    Comment: Glucose reference range applies only to samples taken after fasting for at least 8 hours.  Glucose, capillary     Status: Abnormal   Collection Time: 09/11/21  5:23 PM  Result Value Ref Range   Glucose-Capillary 107 (H) 70 - 99 mg/dL    Comment: Glucose reference range applies only to samples taken after fasting for at least 8 hours.  Glucose, capillary     Status: Abnormal   Collection Time: 09/11/21  9:02 PM  Result Value Ref Range   Glucose-Capillary 227 (H) 70 - 99 mg/dL    Comment: Glucose reference range applies only to samples taken after fasting for at least 8 hours.  CBC     Status: Abnormal   Collection Time: 09/12/21  4:56 AM  Result Value Ref Range   WBC 11.0 (H) 4.0 - 10.5 K/uL   RBC 2.32 (L) 3.87 - 5.11 MIL/uL   Hemoglobin 7.9 (L) 12.0 - 15.0 g/dL   HCT 24.8 (L) 36.0 - 46.0 %   MCV 106.9 (H) 80.0 - 100.0 fL   MCH 34.1 (H) 26.0 - 34.0 pg   MCHC 31.9 30.0 - 36.0 g/dL   RDW 14.6 11.5 - 15.5 %   Platelets 153 150 - 400 K/uL   nRBC  0.5 (H) 0.0 - 0.2 %    Comment: Performed at [facility], [place] [address] [place], [place] [address]  Basic metabolic panel     Status: Abnormal   Collection Time: 09/12/21  4:56 AM  Result Value Ref Range   Sodium 132 (L) 135 - 145 mmol/L   Potassium 5.2 (H) 3.5 - 5.1 mmol/L   Chloride 96 (L) 98 - 111 mmol/L   CO2 21 (L) 22 - 32 mmol/L   Glucose, Bld 215 (H) 70 - 99 mg/dL    Comment: Glucose reference range applies only to samples taken after fasting for  at least 8 hours.   BUN 64 (H) 8 - 23 mg/dL   Creatinine, Ser 8.39 (H) 0.44 - 1.00 mg/dL   Calcium 9.0 8.9 - 10.3 mg/dL   GFR, Estimated 5 (L) >60 mL/min    Comment: (NOTE) Calculated using the CKD-EPI Creatinine Equation (2021)    Anion gap 15 5 - 15    Comment: Performed at [facility] [address] [place], [place] [address]  Glucose, capillary     Status: Abnormal   Collection Time: 09/12/21 10:12 AM  Result Value Ref Range   Glucose-Capillary 157 (H) 70 - 99 mg/dL    Comment: Glucose reference range applies only to samples taken after fasting for at least 8 hours.  Glucose, capillary     Status: Abnormal   Collection Time: 09/12/21 11:03 AM  Result Value Ref Range   Glucose-Capillary 215 (H) 70 - 99 mg/dL    Comment: Glucose reference range applies only to samples taken after fasting for at least 8 hours.  CBC     Status: Abnormal   Collection Time: 09/12/21  2:56 PM  Result Value Ref Range   WBC 14.8 (H) 4.0 - 10.5 K/uL   RBC 2.34 (L) 3.87 - 5.11 MIL/uL   Hemoglobin 8.3 (L) 12.0 - 15.0 g/dL   HCT 25.4 (L) 36.0 - 46.0 %   MCV 108.5 (H) 80.0 - 100.0 fL   MCH 35.5 (H) 26.0 - 34.0 pg   MCHC 32.7 30.0 - 36.0 g/dL   RDW 14.9 11.5 - 15.5 %   Platelets 199 150 - 400 K/uL   nRBC 0.7 (H) 0.0 - 0.2 %    Comment: Performed at [facility], [place] [address] [place], [place] [address]  Type and screen [place]     Status: None   Collection Time: 09/12/21  2:58 PM  Result Value Ref Range   ABO/RH(D) A POS    Antibody Screen NEG    Sample Expiration      09/15/2021,2359 Performed at [facility], [place] [address] [place], [place] [address]   Glucose, capillary     Status: Abnormal   Collection Time: 09/12/21  3:04 PM  Result Value Ref Range   Glucose-Capillary 278 (H) 70 - 99 mg/dL    Comment: Glucose reference range applies only to samples taken after fasting for at least 8 hours.    CT ABDOMEN PELVIS WO CONTRAST  Result Date:  09/12/2021 CLINICAL DATA:  Right upper quadrant pain nondiagnostic ultrasound EXAM: CT ABDOMEN AND PELVIS WITHOUT CONTRAST TECHNIQUE: Multidetector CT imaging of the abdomen and pelvis was performed following the standard protocol without IV contrast. COMPARISON:  None. FINDINGS: Lower chest: Small bilateral pleural effusions with compressive atelectasis of lower lobes. Heart is mildly enlarged. Scattered calcifications in the coronary arteries and aorta. Hepatobiliary: Prior cholecystectomy.  No focal hepatic abnormality. Pancreas: No focal abnormality or ductal dilatation. Spleen: No focal abnormality.  Normal size. Adrenals/Urinary Tract: Atrophic kidneys bilaterally. No hydronephrosis. No adrenal mass. Urinary bladder contains contrast, question source. No bladder wall abnormality. Stomach/Bowel: Normal appendix. No bowel obstruction. There is mild inflammation in the right upper quadrant adjacent to the cholecystectomy clips. This possibly is related to the distal stomach or proximal duodenum. In addition, there are several small locules of gas adjacent to the descending colon (image 35 series 3). No diverticula seen in this area on prior study. This is concerning for extraluminal gas. Constellation of findings suggest possibility of peptic ulcer disease and perforated ulcer. Vascular/Lymphatic: Aortic atherosclerosis. No evidence of aneurysm or adenopathy. Reproductive: Uterus and adnexa unremarkable.  No mass. Other: No free fluid or free air. Musculoskeletal: No acute bony abnormality. IMPRESSION: Inflammation in the right upper quadrant adjacent to the distal stomach, proximal duodenum and cholecystectomy clips. This was not present on prior study. In addition, concern for extraluminal locules of gas adjacent to the descending duodenum. Constellation of findings concerning for peptic ulcer disease with possible perforated ulcer. Small bilateral pleural effusions with bibasilar atelectasis. Aortic  atherosclerosis. Atrophic kidneys bilaterally. Contrast is seen within the urinary bladder of unknown source (I do not see a recent contrast enhanced CT). These results were called by telephone at the time of interpretation on 09/12/2021 at 2:46 pm to provider Dr. Lynetta Mare, Who verbally acknowledged these results. Electronically Signed   By: Rolm Baptise M.D.   On: 09/12/2021 14:46   DG CHEST PORT 1 VIEW  Result Date: 09/12/2021 CLINICAL DATA:  Chest pain.  Follow-up. EXAM: PORTABLE CHEST 1 VIEW COMPARISON:  09/08/2021 FINDINGS: Previous median sternotomy. Right internal jugular central line with tips at the SVC RA junction or proximal right atrium. Worsened congestive heart failure with worsened pulmonary edema. No visible pleural effusion. IMPRESSION: Worsened diffuse pulmonary edema. Electronically Signed   By: Nelson Chimes M.D.   On: 09/12/2021 14:57   ECHOCARDIOGRAM LIMITED  Result Date: 09/12/2021    ECHOCARDIOGRAM LIMITED REPORT   Patient Name:   LAUNA GOEDKEN Date of Exam: 09/12/2021 Medical Rec #:  [id number]        Height:       65.0 in Accession #:    [id number]       Weight:       216.9 lb Date of Birth:  [birth year]       BSA:          2.048 m Patient Age:    74 years         BP:           95/25 mmHg Patient Gender: F                HR:           651 bpm. Exam Location:  Inpatient Procedure: Limited Echo, Color Doppler and Cardiac Doppler Indications:    Chest Pain R07.9  History:        Patient has prior history of Echocardiogram examinations, most                 recent 06/15/2021. CHF, CAD and Previous Myocardial Infarction,                 PAD, Arrythmias:Bradycardia; Risk Factors:Hypertension,                 Dyslipidemia and Diabetes. End stage renal disease.  Sonographer:    Jonelle Sidle  Burt Knack RDCS Referring Phys: [id number] PAULA V ROSS IMPRESSIONS  1. Left ventricular ejection fraction, by estimation, is 35 to 40%. The left ventricle has moderately decreased function. The left ventricle  demonstrates global hypokinesis. The left ventricular internal cavity size was mildly dilated. There is mild concentric left ventricular hypertrophy. Left ventricular diastolic parameters are consistent with Grade II diastolic dysfunction (pseudonormalization). Elevated left atrial pressure. There is global left ventricular hypokinesis with severe lateral wall hypokinesis.  2. Right ventricular systolic function is normal. The right ventricular size is normal. Tricuspid regurgitation signal is inadequate for assessing PA pressure.  3. Left atrial size was mildly dilated.  4. Severe mitral valve regurgitation.  5. The aortic valve is tricuspid. There is mild calcification of the aortic valve. Aortic valve regurgitation is not visualized. Aortic valve sclerosis is present, with no evidence of aortic valve stenosis. Comparison(s): Prior images reviewed side by side. The left ventricular function is significantly worse. The left ventricular wall motion abnormality is new. There is new severe mitral insufficiency due to ischemic posterior leaflet tethering. FINDINGS  Left Ventricle: Left ventricular ejection fraction, by estimation, is 35 to 40%. The left ventricle has moderately decreased function. The left ventricle demonstrates global hypokinesis. The left ventricular internal cavity size was mildly dilated. There is mild concentric left ventricular hypertrophy. Left ventricular diastolic parameters are consistent with Grade II diastolic dysfunction (pseudonormalization). Elevated left atrial pressure.  LV Wall Scoring: The antero-lateral wall and posterior wall are hypokinetic. There is global left ventricular hypokinesis with severe lateral wall hypokinesis. Right Ventricle: The right ventricular size is normal. No increase in right ventricular wall thickness. Right ventricular systolic function is normal. Tricuspid regurgitation signal is inadequate for assessing PA pressure. Left Atrium: Left atrial size was  mildly dilated. Right Atrium: Right atrial size was normal in size. Pericardium: There is no evidence of pericardial effusion. Mitral Valve: Mild to moderate mitral annular calcification. Severe mitral valve regurgitation, with eccentric laterally directed jet. Tricuspid Valve: The tricuspid valve is normal in structure. Tricuspid valve regurgitation is trivial. Aortic Valve: The aortic valve is tricuspid. There is mild calcification of the aortic valve. Aortic valve regurgitation is not visualized. Aortic valve sclerosis is present, with no evidence of aortic valve stenosis. Pulmonic Valve: The pulmonic valve was not well visualized. Pulmonic valve regurgitation is not visualized. Aorta: The aortic root is normal in size and structure. IAS/Shunts: No atrial level shunt detected by color flow Doppler. LEFT VENTRICLE PLAX 2D LVIDd:         5.68 cm LVIDs:         5.04 cm LV PW:         1.24 cm LV IVS:        1.30 cm LVOT diam:     1.95 cm LV SV:         58 LV SV Index:   28 LVOT Area:     2.98 cm  AORTIC VALVE LVOT Vmax:   120.62 cm/s LVOT Vmean:  71.701 cm/s LVOT VTI:    0.194 m MITRAL VALVE MV Area (plan): 2.62 cm  SHUNTS                           Systemic VTI:  0.19 m                           Systemic Diam: 1.95 cm Dani Gobble Croitoru MD Electronically signed by Sanda Klein  MD Signature Date/Time: 09/12/2021/2:39:01 PM    Final     ROS - all of the below systems have been reviewed with the patient and positives are indicated with bold text General: chills, fever or night sweats Eyes: blurry vision or double vision ENT: epistaxis or sore throat Allergy/Immunology: itchy/watery eyes or nasal congestion Hematologic/Lymphatic: bleeding problems, blood clots or swollen lymph nodes Endocrine: temperature intolerance or unexpected weight changes Breast: new or changing breast lumps or nipple discharge Resp: cough, shortness of breath, or wheezing CV: chest pain or dyspnea on exertion GI: as per HPI GU:  dysuria, trouble voiding, or hematuria MSK: joint pain or joint stiffness Neuro: TIA or stroke symptoms Derm: pruritus and skin lesion changes Psych: anxiety and depression  PE Blood pressure (!) 70/33, pulse (!) 48, temperature 98.4 F (36.9 C), temperature source Oral, resp. rate 18, height 5\' 5"  (1.651 m), weight 98.4 kg, SpO2 (!) 86 %. Constitutional: NAD; sleepy but conversant; no deformities Eyes: Moist conjunctiva; no lid lag; anicteric; PERRL Neck: Trachea midline; no thyromegaly Lungs: Normal respiratory effort; no tactile fremitus CV: RRR; no palpable thrills, no pitting edema GI: Abd obese, soft, nontender throughout, nondistended, no rebound nor guarding; no palpable hepatosplenomegaly MSK: Limited range of motion of extremities due to fatigue; no clubbing/cyanosis Psychiatric: Appropriate affect; alert and oriented x3 Lymphatic: No palpable cervical or axillary lymphadenopathy  Results for orders placed or performed during the hospital encounter of 09/08/21 (from the past 48 hour(s))  Glucose, capillary     Status: Abnormal   Collection Time: 09/10/21  4:55 PM  Result Value Ref Range   Glucose-Capillary 156 (H) 70 - 99 mg/dL    Comment: Glucose reference range applies only to samples taken after fasting for at least 8 hours.  Glucose, capillary     Status: Abnormal   Collection Time: 09/10/21  8:10 PM  Result Value Ref Range   Glucose-Capillary 104 (H) 70 - 99 mg/dL    Comment: Glucose reference range applies only to samples taken after fasting for at least 8 hours.  CBC     Status: Abnormal   Collection Time: 09/11/21  3:27 AM  Result Value Ref Range   WBC 10.2 4.0 - 10.5 K/uL   RBC 2.27 (L) 3.87 - 5.11 MIL/uL   Hemoglobin 8.0 (L) 12.0 - 15.0 g/dL   HCT 24.4 (L) 36.0 - 46.0 %   MCV 107.5 (H) 80.0 - 100.0 fL   MCH 35.2 (H) 26.0 - 34.0 pg   MCHC 32.8 30.0 - 36.0 g/dL   RDW 14.4 11.5 - 15.5 %   Platelets 149 (L) 150 - 400 K/uL   nRBC 0.4 (H) 0.0 - 0.2 %     Comment: Performed at [facility] [address], [place], [place] [address]  Basic metabolic panel     Status: Abnormal   Collection Time: 09/11/21  3:27 AM  Result Value Ref Range   Sodium 129 (L) 135 - 145 mmol/L   Potassium 5.1 3.5 - 5.1 mmol/L   Chloride 93 (L) 98 - 111 mmol/L   CO2 23 22 - 32 mmol/L   Glucose, Bld 190 (H) 70 - 99 mg/dL    Comment: Glucose reference range applies only to samples taken after fasting for at least 8 hours.   BUN 49 (H) 8 - 23 mg/dL   Creatinine, Ser 7.45 (H) 0.44 - 1.00 mg/dL   Calcium 8.9 8.9 - 10.3 mg/dL   GFR, Estimated 6 (L) >60 mL/min  Comment: (NOTE) Calculated using the CKD-EPI Creatinine Equation (2021)    Anion gap 13 5 - 15    Comment: Performed at [facility], [place] [address] [place], [place] [address]  Glucose, capillary     Status: Abnormal   Collection Time: 09/11/21  9:36 AM  Result Value Ref Range   Glucose-Capillary 278 (H) 70 - 99 mg/dL    Comment: Glucose reference range applies only to samples taken after fasting for at least 8 hours.  Glucose, capillary     Status: Abnormal   Collection Time: 09/11/21 11:22 AM  Result Value Ref Range   Glucose-Capillary 260 (H) 70 - 99 mg/dL    Comment: Glucose reference range applies only to samples taken after fasting for at least 8 hours.  Glucose, capillary     Status: Abnormal   Collection Time: 09/11/21  2:26 PM  Result Value Ref Range   Glucose-Capillary 156 (H) 70 - 99 mg/dL    Comment: Glucose reference range applies only to samples taken after fasting for at least 8 hours.  Glucose, capillary     Status: Abnormal   Collection Time: 09/11/21  5:23 PM  Result Value Ref Range   Glucose-Capillary 107 (H) 70 - 99 mg/dL    Comment: Glucose reference range applies only to samples taken after fasting for at least 8 hours.  Glucose, capillary     Status: Abnormal   Collection Time: 09/11/21  9:02 PM  Result Value Ref Range   Glucose-Capillary 227 (H) 70 - 99 mg/dL     Comment: Glucose reference range applies only to samples taken after fasting for at least 8 hours.  CBC     Status: Abnormal   Collection Time: 09/12/21  4:56 AM  Result Value Ref Range   WBC 11.0 (H) 4.0 - 10.5 K/uL   RBC 2.32 (L) 3.87 - 5.11 MIL/uL   Hemoglobin 7.9 (L) 12.0 - 15.0 g/dL   HCT 24.8 (L) 36.0 - 46.0 %   MCV 106.9 (H) 80.0 - 100.0 fL   MCH 34.1 (H) 26.0 - 34.0 pg   MCHC 31.9 30.0 - 36.0 g/dL   RDW 14.6 11.5 - 15.5 %   Platelets 153 150 - 400 K/uL   nRBC 0.5 (H) 0.0 - 0.2 %    Comment: Performed at [facility] [address], [place], [place] [address]  Basic metabolic panel     Status: Abnormal   Collection Time: 09/12/21  4:56 AM  Result Value Ref Range   Sodium 132 (L) 135 - 145 mmol/L   Potassium 5.2 (H) 3.5 - 5.1 mmol/L   Chloride 96 (L) 98 - 111 mmol/L   CO2 21 (L) 22 - 32 mmol/L   Glucose, Bld 215 (H) 70 - 99 mg/dL    Comment: Glucose reference range applies only to samples taken after fasting for at least 8 hours.   BUN 64 (H) 8 - 23 mg/dL   Creatinine, Ser 8.39 (H) 0.44 - 1.00 mg/dL   Calcium 9.0 8.9 - 10.3 mg/dL   GFR, Estimated 5 (L) >60 mL/min    Comment: (NOTE) Calculated using the CKD-EPI Creatinine Equation (2021)    Anion gap 15 5 - 15    Comment: Performed at [facility] [address], [place], [place] [address]  Glucose, capillary     Status: Abnormal   Collection Time: 09/12/21 10:12 AM  Result Value Ref Range   Glucose-Capillary 157 (H) 70 - 99  mg/dL    Comment: Glucose reference range applies only to samples taken after fasting for at least 8 hours.  Glucose, capillary     Status: Abnormal   Collection Time: 09/12/21 11:03 AM  Result Value Ref Range   Glucose-Capillary 215 (H) 70 - 99 mg/dL    Comment: Glucose reference range applies only to samples taken after fasting for at least 8 hours.  CBC     Status: Abnormal   Collection Time: 09/12/21  2:56 PM  Result Value Ref Range   WBC 14.8 (H) 4.0 - 10.5 K/uL   RBC 2.34  (L) 3.87 - 5.11 MIL/uL   Hemoglobin 8.3 (L) 12.0 - 15.0 g/dL   HCT 25.4 (L) 36.0 - 46.0 %   MCV 108.5 (H) 80.0 - 100.0 fL   MCH 35.5 (H) 26.0 - 34.0 pg   MCHC 32.7 30.0 - 36.0 g/dL   RDW 14.9 11.5 - 15.5 %   Platelets 199 150 - 400 K/uL   nRBC 0.7 (H) 0.0 - 0.2 %    Comment: Performed at [facility], [place] [address], [place], [place] [address]  Type and screen [place]     Status: None   Collection Time: 09/12/21  2:58 PM  Result Value Ref Range   ABO/RH(D) A POS    Antibody Screen NEG    Sample Expiration      09/15/2021,2359 Performed at [facility], [place] [address], [place], [place] [address]   Glucose, capillary     Status: Abnormal   Collection Time: 09/12/21  3:04 PM  Result Value Ref Range   Glucose-Capillary 278 (H) 70 - 99 mg/dL    Comment: Glucose reference range applies only to samples taken after fasting for at least 8 hours.    CT ABDOMEN PELVIS WO CONTRAST  Result Date: 09/12/2021 CLINICAL DATA:  Right upper quadrant pain nondiagnostic ultrasound EXAM: CT ABDOMEN AND PELVIS WITHOUT CONTRAST TECHNIQUE: Multidetector CT imaging of the abdomen and pelvis was performed following the standard protocol without IV contrast. COMPARISON:  None. FINDINGS: Lower chest: Small bilateral pleural effusions with compressive atelectasis of lower lobes. Heart is mildly enlarged. Scattered calcifications in the coronary arteries and aorta. Hepatobiliary: Prior cholecystectomy.  No focal hepatic abnormality. Pancreas: No focal abnormality or ductal dilatation. Spleen: No focal abnormality.  Normal size. Adrenals/Urinary Tract: Atrophic kidneys bilaterally. No hydronephrosis. No adrenal mass. Urinary bladder contains contrast, question source. No bladder wall abnormality. Stomach/Bowel: Normal appendix. No bowel obstruction. There is mild inflammation in the right upper quadrant adjacent to the cholecystectomy clips. This possibly is related to the distal stomach  or proximal duodenum. In addition, there are several small locules of gas adjacent to the descending colon (image 35 series 3). No diverticula seen in this area on prior study. This is concerning for extraluminal gas. Constellation of findings suggest possibility of peptic ulcer disease and perforated ulcer. Vascular/Lymphatic: Aortic atherosclerosis. No evidence of aneurysm or adenopathy. Reproductive: Uterus and adnexa unremarkable.  No mass. Other: No free fluid or free air. Musculoskeletal: No acute bony abnormality. IMPRESSION: Inflammation in the right upper quadrant adjacent to the distal stomach, proximal duodenum and cholecystectomy clips. This was not present on prior study. In addition, concern for extraluminal locules of gas adjacent to the descending duodenum. Constellation of findings concerning for peptic ulcer disease with possible perforated ulcer. Small bilateral pleural effusions with bibasilar atelectasis. Aortic atherosclerosis. Atrophic kidneys bilaterally. Contrast is seen within the urinary bladder of unknown  source (I do not see a recent contrast enhanced CT). These results were called by telephone at the time of interpretation on 09/12/2021 at 2:46 pm to provider Dr. Lynetta Mare, Who verbally acknowledged these results. Electronically Signed   By: Rolm Baptise M.D.   On: 09/12/2021 14:46   DG CHEST PORT 1 VIEW  Result Date: 09/12/2021 CLINICAL DATA:  Chest pain.  Follow-up. EXAM: PORTABLE CHEST 1 VIEW COMPARISON:  09/08/2021 FINDINGS: Previous median sternotomy. Right internal jugular central line with tips at the SVC RA junction or proximal right atrium. Worsened congestive heart failure with worsened pulmonary edema. No visible pleural effusion. IMPRESSION: Worsened diffuse pulmonary edema. Electronically Signed   By: Nelson Chimes M.D.   On: 09/12/2021 14:57   ECHOCARDIOGRAM LIMITED  Result Date: 09/12/2021    ECHOCARDIOGRAM LIMITED REPORT   Patient Name:   JIM PHILEMON Date of  Exam: 09/12/2021 Medical Rec #:  [id number]        Height:       65.0 in Accession #:    [id number]       Weight:       216.9 lb Date of Birth:  June 22, 1956       BSA:          2.048 m Patient Age:    12 years         BP:           95/25 mmHg Patient Gender: F                HR:           651 bpm. Exam Location:  Inpatient Procedure: Limited Echo, Color Doppler and Cardiac Doppler Indications:    Chest Pain R07.9  History:        Patient has prior history of Echocardiogram examinations, most                 recent 06/15/2021. CHF, CAD and Previous Myocardial Infarction,                 PAD, Arrythmias:Bradycardia; Risk Factors:Hypertension,                 Dyslipidemia and Diabetes. End stage renal disease.  Sonographer:    Darlina Sicilian RDCS Referring Phys: [id number] PAULA V ROSS IMPRESSIONS  1. Left ventricular ejection fraction, by estimation, is 35 to 40%. The left ventricle has moderately decreased function. The left ventricle demonstrates global hypokinesis. The left ventricular internal cavity size was mildly dilated. There is mild concentric left ventricular hypertrophy. Left ventricular diastolic parameters are consistent with Grade II diastolic dysfunction (pseudonormalization). Elevated left atrial pressure. There is global left ventricular hypokinesis with severe lateral wall hypokinesis.  2. Right ventricular systolic function is normal. The right ventricular size is normal. Tricuspid regurgitation signal is inadequate for assessing PA pressure.  3. Left atrial size was mildly dilated.  4. Severe mitral valve regurgitation.  5. The aortic valve is tricuspid. There is mild calcification of the aortic valve. Aortic valve regurgitation is not visualized. Aortic valve sclerosis is present, with no evidence of aortic valve stenosis. Comparison(s): Prior images reviewed side by side. The left ventricular function is significantly worse. The left ventricular wall motion abnormality is new. There is new severe mitral  insufficiency due to ischemic posterior leaflet tethering. FINDINGS  Left Ventricle: Left ventricular ejection fraction, by estimation, is 35 to 40%. The left ventricle has moderately decreased function. The left ventricle demonstrates global hypokinesis. The left ventricular internal  cavity size was mildly dilated. There is mild concentric left ventricular hypertrophy. Left ventricular diastolic parameters are consistent with Grade II diastolic dysfunction (pseudonormalization). Elevated left atrial pressure.  LV Wall Scoring: The antero-lateral wall and posterior wall are hypokinetic. There is global left ventricular hypokinesis with severe lateral wall hypokinesis. Right Ventricle: The right ventricular size is normal. No increase in right ventricular wall thickness. Right ventricular systolic function is normal. Tricuspid regurgitation signal is inadequate for assessing PA pressure. Left Atrium: Left atrial size was mildly dilated. Right Atrium: Right atrial size was normal in size. Pericardium: There is no evidence of pericardial effusion. Mitral Valve: Mild to moderate mitral annular calcification. Severe mitral valve regurgitation, with eccentric laterally directed jet. Tricuspid Valve: The tricuspid valve is normal in structure. Tricuspid valve regurgitation is trivial. Aortic Valve: The aortic valve is tricuspid. There is mild calcification of the aortic valve. Aortic valve regurgitation is not visualized. Aortic valve sclerosis is present, with no evidence of aortic valve stenosis. Pulmonic Valve: The pulmonic valve was not well visualized. Pulmonic valve regurgitation is not visualized. Aorta: The aortic root is normal in size and structure. IAS/Shunts: No atrial level shunt detected by color flow Doppler. LEFT VENTRICLE PLAX 2D LVIDd:         5.68 cm LVIDs:         5.04 cm LV PW:         1.24 cm LV IVS:        1.30 cm LVOT diam:     1.95 cm LV SV:         58 LV SV Index:   28 LVOT Area:     2.98 cm   AORTIC VALVE LVOT Vmax:   120.62 cm/s LVOT Vmean:  71.701 cm/s LVOT VTI:    0.194 m MITRAL VALVE MV Area (plan): 2.62 cm  SHUNTS                           Systemic VTI:  0.19 m                           Systemic Diam: 1.95 cm Dani Gobble Croitoru MD Electronically signed by Sanda Klein MD Signature Date/Time: 09/12/2021/2:39:01 PM    Final     I have personally reviewed the relevant CT A/P dated today CBC, BMP  A/P: Ily Denno Ramroop is an 65 y.o. female with HTN, HLD, DM, substantial CAD/revascularizations now with severe mitral regurg, ESRD - asked to see for possibility of PUD  -Abdominal examination is completely benign -I suspect the source of her shock may be cardiogenic based on her medical history.  It would be unusual that more sitting on her CT scan would all cause any sort of significant septic picture and/or shock particularly in absence of any abdominal findings on examination. -Can empirically cover with IV antibiotics and twice daily PPI; would keep npo for time being given findings on CT and her recent hx of nausea/vomiting -Avoid NSAIDs if at all possible -We will follow with you  Nadeen Landau, MD [facility], [place]

## 2021-09-12 NOTE — Progress Notes (Signed)
MD at bedside placing central line. Fran Lowes, RN VAST

## 2021-09-12 NOTE — Progress Notes (Signed)
Pt is going to CT now  BP 80s (on Levophed)   ON NRB   Per nursing wheezing Echo (quick look) No pericardial effusion.  LVEF is depressed with lateral lateral hypokinesis.  MR is posteriorly directed and appears severe  Severe MR is new from September.    Full report to follow  I have spoken with R Schertz.   Will order portable CXR when gets back to Unit  Given severity of CAD (not felt to be a candidate for further intervention), now severe MR, ESRD will ask palliative care to see patient.  Dorris Carnes MD

## 2021-09-12 NOTE — Consult Note (Signed)
NAME:  Donna Howe, MRN:  [id number], DOB:  1955/12/01, LOS: 4 ADMISSION DATE:  09/08/2021, CONSULTATION DATE:  09/12/2021 REFERRING MD:  Dr. Harrington Challenger, CHIEF COMPLAINT: Hypotension with hypoxia  History of Present Illness:  Donna Howe 65 year old female with an extensive past medical history status post CABG in 2005 with multiple PCI's since, multiple NSTEMI's and has been deemed too high risk for CABG re-do. HTN. HLD, type 2 diabetes, and ESRD on HD MWF who presented to the emergency department with complaints of recurrent chest pain.  Patient reports several episodes of substernal chest pain unrelieved by nitroglycerin (utilized and entire bottle prior to admission and 1 week)  Cardiology evaluated patient on admission and plan was made to repeat heart catheterization.  This was performed 12/22 and revealed three-vessel occlusive CAD.  Patient was taken back for heart catheterization 12/23 for PCI and balloon angioplasty was performed to left main.   Critical care consulted 12/26 due to persistent severe hypertension  Pertinent  Medical History  Prior CABG in 2005 with multiple PCI's since, multiple NSTEMI's and has been deemed too high risk for CABG re-do. HTN. HLD, type 2 diabetes, and ESRD on HD MWF  Significant Hospital Events: Including procedures, antibiotic start and stop dates in addition to other pertinent events   12/22 admitted with complaints of chest pain underwent diagnostic left heart cath which revealed severe three-vessel occlusive disease 12/23 underwent balloon angioplasty to left main 12/26 developed significant hypotension resulting in transfer ICU for further management  Interim History / Subjective:  As above  Objective   Blood pressure (!) 70/33, pulse (!) 48, temperature 98.4 F (36.9 C), temperature source Oral, resp. rate 18, height 5\' 5"  (1.651 m), weight 98.4 kg, SpO2 (!) 86 %.        Intake/Output Summary (Last 24 hours) at 09/12/2021 1336 Last  data filed at 09/12/2021 1019 Gross per 24 hour  Intake 860.97 ml  Output -668 ml  Net 1528.97 ml   Filed Weights   09/10/21 1342 09/11/21 0807 09/12/21 0534  Weight: 96.9 kg 97.2 kg 98.4 kg    Examination: General: Acute on chronic ill appearing elderly female lying in bed on nonrebreather, in NAD HEENT: [place]/AT, MM pink/moist, PERRL,  Neuro: Will arouse to verbal stimuli, lethargic CV: s1s2 regular rate and rhythm, no murmur, rubs, or gallops,  PULM: Diminished air entry bilaterally, oxygen saturations appropriate on nonrebreather, mild accessory muscle use GI: soft, bowel sounds active in all 4 quadrants, non-tender, non-distended Extremities: warm/dry, nonpitting generalized edema  Skin: no rashes or lesions  Resolved Hospital Problem list     Assessment & Plan:  NSTEMI with complex CAD history -Patient has underwent multiple heart caths and PCI's with multiple NSTEMI's.  Several coronary artery lesions unsalvageable History of HTN/HLD -Home medication regimen includes aspirin, Lipitor, Ranexa, and Plavix Ongoing chest pain/unstable angina P: Primary management per cardiology/heart failure Continuous telemetry  Strict intake and output  Daily weight to assess volume status Volume control per HD Closely monitor renal function and electrolytes  Provide supplemtal oxygen as needed to maintain oxygen saturations above 90%  Ensure hemodynamic control   New onset hypertension with concern for underlying bleed  -Hypotension began early this a.m.  CT ABD negative for RP bleed but features concerning for ? Perforated ulcer  P: Consult GI vs General surgery  Obtain CVP's with central access  Recheck CBC  Obtain type and screen  Check lactic  Pressors for MAP goal > 65 Midodrine  End-stage  renal disease -Typically completes hemodialysis TTS P: Nephrology following, appreciate assistance HD per nephrology, last session 12/26 Follow renal function  Monitor urine  output Trend Bmet Avoid nephrotoxins Ensure adequate renal perfusion   Type 2 diabetes -Home medication includes 70/30 P: Continue SSI CBG goal 140-180  Anemia of chronic disease P: Hgb stable  Trends Bmet  Hgb goal >7 Transfuse per protocol   Hyponatremia P: Managing with HD Trend bmet   Best Practice (right click and "Reselect all SmartList Selections" daily)   Diet/type: NPO DVT prophylaxis: prophylactic heparin  GI prophylaxis: PPI Lines: N/A Foley:  N/A Code Status:  full code Last date of multidisciplinary goals of care discussion: Per primary   Labs   CBC: Recent Labs  Lab 09/08/21 1058 09/09/21 0251 09/10/21 0119 09/11/21 0327 09/12/21 0456  WBC 14.1* 10.0 9.9 10.2 11.0*  HGB 10.7* 9.2* 8.9* 8.0* 7.9*  HCT 34.0* 29.2* 28.6* 24.4* 24.8*  MCV 109.3* 108.1* 108.3* 107.5* 106.9*  PLT 220 170 173 149* 161    Basic Metabolic Panel: Recent Labs  Lab 09/09/21 0251 09/09/21 1806 09/10/21 0119 09/11/21 0327 09/12/21 0456  NA 134* 131* 131* 129* 132*  K 3.9 4.1 4.8 5.1 5.2*  CL 96* 95* 94* 93* 96*  CO2 27 25 25 23  21*  GLUCOSE 167* 244* 225* 190* 215*  BUN 20 29* 34* 49* 64*  CREATININE 4.31* 5.42* 5.93* 7.45* 8.39*  CALCIUM 8.6* 8.5* 9.1 8.9 9.0  PHOS  --  5.9*  --   --   --    GFR: Estimated Creatinine Clearance: 8 mL/min (A) (by C-G formula based on SCr of 8.39 mg/dL (H)). Recent Labs  Lab 09/09/21 0251 09/10/21 0119 09/11/21 0327 09/12/21 0456  WBC 10.0 9.9 10.2 11.0*    Liver Function Tests: Recent Labs  Lab 09/09/21 0251 09/09/21 1806  AST 85*  --   ALT 45*  --   ALKPHOS 78  --   BILITOT 0.9  --   PROT 6.2*  --   ALBUMIN 3.6 3.1*   No results for input(s): LIPASE, AMYLASE in the last 168 hours. No results for input(s): AMMONIA in the last 168 hours.  ABG    Component Value Date/Time   TCO2 25 05/11/2021 0656     Coagulation Profile: No results for input(s): INR, PROTIME in the last 168 hours.  Cardiac  Enzymes: No results for input(s): CKTOTAL, CKMB, CKMBINDEX, TROPONINI in the last 168 hours.  HbA1C: Hgb A1c MFr Bld  Date/Time Value Ref Range Status  08/26/2021 10:24 AM 7.1 (H) 4.8 - 5.6 % Final    Comment:    (NOTE) Pre diabetes:          5.7%-6.4%  Diabetes:              >6.4%  Glycemic control for   <7.0% adults with diabetes   06/14/2021 04:44 AM 7.2 (H) 4.8 - 5.6 % Final    Comment:    (NOTE)         Prediabetes: 5.7 - 6.4         Diabetes: >6.4         Glycemic control for adults with diabetes: <7.0     CBG: Recent Labs  Lab 09/11/21 1426 09/11/21 1723 09/11/21 2102 09/12/21 1012 09/12/21 1103  GLUCAP 156* 107* 227* 157* 215*    Review of Systems:   Unable to assess   Past Medical History:  She,  has a past medical history of Anemia, Anxiety,  Asthma, CAD (coronary artery disease), Carotid artery disease ([place]), ESRD on hemodialysis ([place]), Essential hypertension, Gout, HFmrEF (heart failure with mildly reduced EF), History of blood transfusion, Hyperlipidemia, Hypothyroidism, Myocardial infarction [facility]), PAD (peripheral artery disease) ([place]), Pneumonia (09/2019, 11/2019), S/P angioplasty with stent- DES to [facility] and to [place] to LAD with DES 04/09/18.   (04/10/2018), SBO (small bowel obstruction) ([place]) (2011), Sinus bradycardia, Type 2 diabetes mellitus ([place]), and Umbilical hernia.   Surgical History:   Past Surgical History:  Procedure Laterality Date   AORTIC ARCH ANGIOGRAPHY N/A 08/02/2020   Procedure: AORTIC ARCH ANGIOGRAPHY;  Surgeon: Waynetta Sandy, MD;  Location: [place] [facility];  Service: Cardiovascular;  Laterality: N/A;  Lt upper extermity   AV FISTULA PLACEMENT Left 06/29/2020   Procedure: LEFT ARM ARTERIOVENOUS (AV) FISTULA;  Surgeon: Waynetta Sandy, MD;  Location: [place];  Service: Vascular;  Laterality: Left;  ARM   AV FISTULA PLACEMENT Right 05/11/2021   Procedure: RIGHT BRACHIOCEPHALIC ARTERIOVENOUS (AV) FISTULA CREATION;   Surgeon: Waynetta Sandy, MD;  Location: [facility] OR;  Service: Vascular;  Laterality: Right;   [place]  2010   CORONARY ARTERY BYPASS GRAFT  2005   CORONARY BALLOON ANGIOPLASTY N/A 05/31/2017   Procedure: CORONARY BALLOON ANGIOPLASTY;  Surgeon: Jettie Booze, MD;  Location: [place] [facility];  Service: Cardiovascular;  Laterality: N/A;   CORONARY BALLOON ANGIOPLASTY N/A 06/14/2021   Procedure: CORONARY BALLOON ANGIOPLASTY;  Surgeon: Jettie Booze, MD;  Location: [place] [facility];  Service: Cardiovascular;  Laterality: N/A;   CORONARY BALLOON ANGIOPLASTY N/A 08/26/2021   Procedure: CORONARY BALLOON ANGIOPLASTY;  Surgeon: Leonie Man, MD;  Location: [place] [facility];  Service: Cardiovascular;  Laterality: N/A;   CORONARY BALLOON ANGIOPLASTY N/A 09/09/2021   Procedure: CORONARY BALLOON ANGIOPLASTY;  Surgeon: Martinique, Peter M, MD;  Location: [place] [facility];  Service: Cardiovascular;  Laterality: N/A;   CORONARY STENT INTERVENTION N/A 05/31/2017   Procedure: CORONARY STENT INTERVENTION;  Surgeon: Jettie Booze, MD;  Location: [place] [facility];  Service: Cardiovascular;  Laterality: N/A;   CORONARY STENT INTERVENTION N/A 04/09/2018   Procedure: CORONARY STENT INTERVENTION;  Surgeon: Jettie Booze, MD;  Location: [place] [facility];  Service: Cardiovascular;  Laterality: N/A;  SVG RCA   CORONARY STENT INTERVENTION N/A 01/10/2021   Procedure: CORONARY STENT INTERVENTION;  Surgeon: Leonie Man, MD;  Location: [place] [facility];  Service: Cardiovascular;  Laterality: N/A;   CORONARY/GRAFT ACUTE MI REVASCULARIZATION N/A 02/24/2021   Procedure: Coronary/Graft Acute MI Revascularization;  Surgeon: Jettie Booze, MD;  Location: [place] [facility];  Service: Cardiovascular;  Laterality: N/A;   ENDARTERECTOMY Right 04/18/2013   Procedure: ENDARTERECTOMY CAROTID;  Surgeon: Mal Misty, MD;  Location: [place];  Service: Vascular;   Laterality: Right;   HERNIA REPAIR  1989   Incisional hernia repair x2  03/04/2010   Laparoscopic with 35cm mesh by Dr Ronnald Collum   IR [place] CV LINE RIGHT  06/21/2020   IR US GUIDE VASC ACCESS RIGHT  06/21/2020   LEFT HEART CATH AND CORS/GRAFTS ANGIOGRAPHY N/A 05/31/2017   Procedure: LEFT HEART CATH AND CORS/GRAFTS ANGIOGRAPHY;  Surgeon: Jettie Booze, MD;  Location: [place] [facility];  Service: Cardiovascular;  Laterality: N/A;   LEFT HEART CATH AND CORS/GRAFTS ANGIOGRAPHY N/A 04/08/2018   Procedure: LEFT HEART CATH AND CORS/GRAFTS ANGIOGRAPHY;  Surgeon: Jettie Booze, MD;  Location: [place] [facility];  Service: Cardiovascular;  Laterality: N/A;   LEFT HEART CATH AND CORS/GRAFTS ANGIOGRAPHY N/A 06/22/2020   Procedure: LEFT HEART CATH AND CORS/GRAFTS ANGIOGRAPHY;  Surgeon: Belva Crome, MD;  Location: [place] [facility];  Service: Cardiovascular;  Laterality: N/A;   LEFT HEART CATH AND CORS/GRAFTS ANGIOGRAPHY N/A 01/10/2021   Procedure: LEFT HEART CATH AND CORS/GRAFTS ANGIOGRAPHY;  Surgeon: Leonie Man, MD;  Location: [place] [facility];  Service: Cardiovascular;  Laterality: N/A;   LEFT HEART CATH AND CORS/GRAFTS ANGIOGRAPHY N/A 02/24/2021   Procedure: LEFT HEART CATH AND CORS/GRAFTS ANGIOGRAPHY;  Surgeon: Jettie Booze, MD;  Location: [place] [facility];  Service: Cardiovascular;  Laterality: N/A;   LEFT HEART CATH AND CORS/GRAFTS ANGIOGRAPHY N/A 06/14/2021   Procedure: LEFT HEART CATH AND CORS/GRAFTS ANGIOGRAPHY;  Surgeon: Jettie Booze, MD;  Location: [place] [facility];  Service: Cardiovascular;  Laterality: N/A;   LEFT HEART CATH AND CORS/GRAFTS ANGIOGRAPHY N/A 08/26/2021   Procedure: LEFT HEART CATH AND CORS/GRAFTS ANGIOGRAPHY;  Surgeon: Leonie Man, MD;  Location: [place] [facility];  Service: Cardiovascular;  Laterality: N/A;   LEFT HEART CATH AND CORS/GRAFTS ANGIOGRAPHY N/A 09/08/2021   Procedure: LEFT HEART CATH AND CORS/GRAFTS ANGIOGRAPHY;   Surgeon: Martinique, Peter M, MD;  Location: [place] [facility];  Service: Cardiovascular;  Laterality: N/A;   LEFT HEART CATHETERIZATION WITH CORONARY ANGIOGRAM N/A 12/19/2012   Procedure: LEFT HEART CATHETERIZATION WITH CORONARY ANGIOGRAM;  Surgeon: Josue Hector, MD;  Location: [facility] CATH LAB;  Service: Cardiovascular;  Laterality: N/A;   LEFT HEART CATHETERIZATION WITH CORONARY/GRAFT ANGIOGRAM N/A 04/19/2013   Procedure: LEFT HEART CATHETERIZATION WITH Beatrix Fetters;  Surgeon: Lorretta Harp, MD;  Location: [facility] CATH LAB;  Service: Cardiovascular;  Laterality: N/A;   LIGATION OF ARTERIOVENOUS  FISTULA Left 09/15/2020   Procedure: LIGATION OF LEFT ARM ARTERIOVENOUS  FISTULA;  Surgeon: Waynetta Sandy, MD;  Location: [place];  Service: Vascular;  Laterality: Left;   PATCH ANGIOPLASTY Right 04/18/2013   Procedure: PATCH ANGIOPLASTY Right Internal Carotid Artery;  Surgeon: Mal Misty, MD;  Location: [place];  Service: Vascular;  Laterality: Right;   PERCUTANEOUS CORONARY STENT INTERVENTION (PCI-S) Right 12/19/2012   Procedure: PERCUTANEOUS CORONARY STENT INTERVENTION (PCI-S);  Surgeon: Josue Hector, MD;  Location[facility] CATH LAB;  Service: Cardiovascular;  Laterality: Right;   PERIPHERAL VASCULAR INTERVENTION Left 08/02/2020   Procedure: PERIPHERAL VASCULAR INTERVENTION;  Surgeon: Waynetta Sandy, MD;  Location: [place] [facility];  Service: Cardiovascular;  Laterality: Left;  Left subclavian   PERIPHERAL VASCULAR INTERVENTION  02/24/2021   Procedure: PERIPHERAL VASCULAR INTERVENTION;  Surgeon: Marty Heck, MD;  Location: [place] [facility];  Service: Vascular;;   SHOULDER SURGERY       Social History:   reports that she quit smoking about 8 years ago. Her smoking use included cigarettes. She has a 20.00 pack-year smoking history. She has never used smokeless tobacco. She reports that she does not drink alcohol and does not use drugs.   Family History:  Her family history  includes AAA (abdominal aortic aneurysm) in her father; Diabetes in her mother; Heart disease in her brother and mother; Hyperlipidemia in her mother and son; Hypertension in her brother, father, mother, and son; Thyroid disease in her father.   Allergies Allergies  Allergen Reactions   Penicillins Other (See Comments)    REACTION: Unknown, told as a child Has patient had a PCN reaction causing immediate rash, facial/tongue/throat swelling, SOB or lightheadedness with  hypotension: Unknown Has patient had a PCN reaction causing severe rash involving mucus membranes or skin necrosis: Unknown Has patient had a PCN reaction that required hospitalization: Unknown Has patient had a PCN reaction occurring within the last 10 years: No If all of the above answers are "NO", then may proceed with Cephalosporin use.    Beta Adrenergic Blockers Other (See Comments)    Hypotension   Brilinta [Ticagrelor]     Shortness of Breath     Home Medications  Prior to Admission medications   Medication Sig Start Date End Date Taking? Authorizing Provider  albuterol (PROVENTIL) (2.5 MG/3ML) 0.083% nebulizer solution Take 3 mLs (2.5 mg total) by nebulization every 2 (two) hours as needed for wheezing. 07/15/21  Yes Kathie Dike, MD  Albuterol Sulfate 108 (90 Base) MCG/ACT AEPB Inhale 2 puffs into the lungs every 6 (six) hours as needed (Shortness of breath).   Yes [provider]  allopurinol (ZYLOPRIM) 100 MG tablet Take one tablet after hemodialysis, on Tuesday, Thursday and Saturday. Patient taking differently: Take 100 mg by mouth Every Tuesday,Thursday,and Saturday with dialysis. After dialysis 06/30/20  Yes Arrien, Jimmy Picket, MD  ALPRAZolam Duanne Moron) 0.5 MG tablet Take 0.25-0.5 mg by mouth See admin instructions. Take 0.25 in the morning and evening and 0.5 mg at bedtime   Yes [provider]  aspirin EC 81 MG tablet Take 81 mg by mouth daily.    Yes [provider]   atorvastatin (LIPITOR) 80 MG tablet Take 1 tablet (80 mg total) by mouth every evening. Patient taking differently: Take 80 mg by mouth at bedtime. 12/19/19  Yes Johnson, Clanford L, MD  budesonide-formoterol (SYMBICORT) 80-4.5 MCG/ACT inhaler Take 2 puffs first thing in am and then another 2 puffs about 12 hours later. 07/22/21  Yes Tanda Rockers, MD  diclofenac Sodium (VOLTAREN) 1 % GEL Apply 2 g topically daily as needed (Pain). 06/24/21  Yes [provider]  ezetimibe (ZETIA) 10 MG tablet Take 1 tablet (10 mg total) by mouth daily. Patient taking differently: Take 10 mg by mouth at bedtime. 02/25/21  Yes Bhagat, Bhavinkumar, PA  ferric citrate (AURYXIA) 1 GM 210 MG(Fe) tablet Take 420 mg by mouth 3 (three) times daily with meals.   Yes [provider]  gabapentin (NEURONTIN) 400 MG capsule Take 1 capsule (400 mg total) by mouth at bedtime. 06/17/21  Yes Domenic Polite, MD  ipratropium (ATROVENT HFA) 17 MCG/ACT inhaler Inhale 2 puffs into the lungs every 4 (four) hours as needed for wheezing.    Yes [provider]  Iron Sucrose (VENOFER IV) Inject 50 mLs into the vein once a week. At dialysis   Yes [provider]  levothyroxine (SYNTHROID) 125 MCG tablet Take 125 mcg by mouth daily before breakfast. 07/25/21  Yes [provider]  Methoxy PEG-Epoetin Beta (MIRCERA IJ) Mircera 07/30/21 07/29/22 Yes [provider]  midodrine (PROAMATINE) 10 MG tablet Take 1 tablet (10 mg total) by mouth 2 (two) times daily with a meal. 06/17/21  Yes Domenic Polite, MD  multivitamin (RENA-VIT) TABS tablet Take 1 tablet by mouth at bedtime.   Yes [provider]  nitroGLYCERIN (NITROSTAT) 0.4 MG SL tablet Place 1 tablet (0.4 mg total) under the tongue every 5 (five) minutes x 3 doses as needed for chest pain. 08/28/21  Yes Strader, [place] M, PA-C  NOVOLIN 70/30 KWIKPEN (70-30) 100 UNIT/ML KwikPen Inject 35 Units into the skin in the morning and at  bedtime. Patient taking differently: Inject  24 Units into the skin in the morning and at bedtime. 07/15/21  Yes Kathie Dike, MD  ondansetron (ZOFRAN) 8 MG tablet Take 8 mg by mouth every 8 (eight) hours as needed for nausea. 07/12/20  Yes [provider]  ONETOUCH ULTRA test strip Check blood glucose THREE TIMES DAILY 03/31/21  Yes [provider]  pantoprazole (PROTONIX) 40 MG tablet Take 1 tablet (40 mg total) by mouth 2 (two) times daily. 06/17/20  Yes Barton Dubois, MD  ranolazine (RANEXA) 500 MG 12 hr tablet Take 1 tablet (500 mg total) by mouth at bedtime. 06/17/21  Yes Domenic Polite, MD  Vitamin D, Ergocalciferol, (DRISDOL) 1.25 MG (50000 UNIT) CAPS capsule Take 50,000 Units by mouth every Sunday.  12/31/19  Yes [provider]  clopidogrel (PLAVIX) 75 MG tablet Take 1 tablet (75 mg total) by mouth daily. 09/11/21   Almyra Deforest, PA  cyclobenzaprine (FLEXERIL) 5 MG tablet Take 1 tablet (5 mg total) by mouth 2 (two) times daily with a meal. Patient not taking: Reported on 08/25/2021 06/17/21   Domenic Polite, MD     Critical care time:    CRITICAL CARE Performed by: Cuong Moorman D. Harris  Total critical care time: 45 minutes  Critical care time was exclusive of separately billable procedures and treating other patients.  Critical care was necessary to treat or prevent imminent or life-threatening deterioration.  Critical care was time spent personally by me on the following activities: development of treatment plan with patient and/or surrogate as well as nursing, discussions with consultants, evaluation of patient's response to treatment, examination of patient, obtaining history from patient or surrogate, ordering and performing treatments and interventions, ordering and review of laboratory studies, ordering and review of radiographic studies, pulse oximetry and re-evaluation of patient's condition.  Rhian Funari D. Kenton Kingfisher, NP-C [facility] Personal contact information can be found on Amion  09/12/2021, 2:42 PM

## 2021-09-12 NOTE — Progress Notes (Signed)
Called from floor   Patinet arrived   BP 59/   Sats 68% Rapid response called to help Pt given Narcan 0.4 mg   (had gotten dilaudid in HD unit)    Pt became much more responsive    Sats improved  BP still low    Giving 500 cc fluid (IV NS) and starting Levophed Transfer to ICU Plan    Abd/pelvic CT to r/o bleed Limited echo to r/o effusion She has been getting S/Q heparin for DVTG prophylaxis.  Dorris Carnes MD

## 2021-09-12 NOTE — Progress Notes (Signed)
Progress Note  Patient Name: Donna Howe Date of Encounter: 09/12/2021  [facility] Cardiologist: Rozann Lesches, MD   Subjective   "I feel horrible.   I want to stop dialysis.  My chest is tight.  My belly is full  My back hurts "  Inpatient Medications    Scheduled Meds:  allopurinol  100 mg Oral Q T,Th,Sa-HD   ALPRAZolam  0.25 mg Oral q morning   ALPRAZolam  0.5 mg Oral QHS   aspirin EC  81 mg Oral Daily   atorvastatin  80 mg Oral QHS   Chlorhexidine Gluconate Cloth  6 each Topical Daily   clopidogrel  75 mg Oral Daily   darbepoetin (ARANESP) injection - DIALYSIS  40 mcg Intravenous Q Thu-HD   ezetimibe  10 mg Oral QHS   ferric citrate  420 mg Oral TID WC   fluticasone furoate-vilanterol  1 puff Inhalation Daily   gabapentin  400 mg Oral QHS   heparin  5,000 Units Subcutaneous Q8H   insulin aspart  0-15 Units Subcutaneous TID WC   insulin aspart  0-5 Units Subcutaneous QHS   insulin aspart protamine- aspart  24 Units Subcutaneous BID WC   isosorbide mononitrate  15 mg Oral Daily   levothyroxine  125 mcg Oral QAC breakfast   midodrine  10 mg Oral BID WC   morphine  2 mg Intravenous Once   pantoprazole  40 mg Oral BID   ranolazine  500 mg Oral QHS   Continuous Infusions:   PRN Meds: acetaminophen, albuterol, HYDROmorphone (DILAUDID) injection, ipratropium, morphine injection, nitroGLYCERIN, ondansetron (ZOFRAN) IV, ondansetron   Vital Signs    Vitals:   09/12/21 0800 09/12/21 0818 09/12/21 0900 09/12/21 0930  BP: (!) (P) 89/38 (!) (P) 70/49 (!) 66/51 (!) 83/41  Pulse: (P) 66 (!) (P) 58 71 72  Resp: (P) 19 (P) 19 20 20   Temp:      TempSrc:      SpO2:      Weight:      Height:        Intake/Output Summary (Last 24 hours) at 09/12/2021 1017 Last data filed at 09/12/2021 0307 Gross per 24 hour  Intake 860.97 ml  Output --  Net 860.97 ml   Last 3 Weights 09/12/2021 09/11/2021 09/10/2021  Weight (lbs) 216 lb 14.9 oz 214 lb 4.8 oz 213 lb 11.2 oz   Weight (kg) 98.4 kg 97.206 kg 96.934 kg      Telemetry    Sinus rhythm-personally reviewed  Physical Exam   Vitals:   09/12/21 0900 09/12/21 0930  BP: (!) 66/51 (!) 83/41  Pulse: 71 72  Resp: 20 20  Temp:    SpO2:     GEN: Obese 65 yo in mild distress  Uncomfortable  HEENT: normal  Neck: no JVD Cardiac: RRR; no murmurs\  NO LE edema   R groin without hematoma  Soft.  Respiratory: Clear anteriorly   Abdomen: Distended   Nontender Decreased BS   Back  No bruising  MS: no deformity or atrophy  Skin: warm and dry      Labs    High Sensitivity Troponin:   Recent Labs  Lab 09/08/21 1034 09/08/21 1210 09/09/21 0251 09/09/21 0631 09/09/21 2019  TROPONINIHS 134* 173* 1,641* 2,762* 7,276*     Chemistry Recent Labs  Lab 09/09/21 0251 09/09/21 1806 09/10/21 0119 09/11/21 0327 09/12/21 0456  NA 134* 131* 131* 129* 132*  K 3.9 4.1 4.8 5.1 5.2*  CL 96*  95* 94* 93* 96*  CO2 27 25 25 23  21*  GLUCOSE 167* 244* 225* 190* 215*  BUN 20 29* 34* 49* 64*  CREATININE 4.31* 5.42* 5.93* 7.45* 8.39*  CALCIUM 8.6* 8.5* 9.1 8.9 9.0  PROT 6.2*  --   --   --   --   ALBUMIN 3.6 3.1*  --   --   --   AST 85*  --   --   --   --   ALT 45*  --   --   --   --   ALKPHOS 78  --   --   --   --   BILITOT 0.9  --   --   --   --   GFRNONAA 11* 8* 7* 6* 5*  ANIONGAP 11 11 12 13 15     Lipids No results for input(s): CHOL, TRIG, HDL, LABVLDL, LDLCALC, CHOLHDL in the last 168 hours.  Hematology Recent Labs  Lab 09/10/21 0119 09/11/21 0327 09/12/21 0456  WBC 9.9 10.2 11.0*  RBC 2.64* 2.27* 2.32*  HGB 8.9* 8.0* 7.9*  HCT 28.6* 24.4* 24.8*  MCV 108.3* 107.5* 106.9*  MCH 33.7 35.2* 34.1*  MCHC 31.1 32.8 31.9  RDW 14.6 14.4 14.6  PLT 173 149* 153   Thyroid No results for input(s): TSH, FREET4 in the last 168 hours.  BNP Recent Labs  Lab 09/09/21 0251  BNP 3,297.0*    DDimer No results for input(s): DDIMER in the last 168 hours.   Radiology    No results found.  Cardiac  Studies   LHC 09/09/21      Ost LM to Dist LM lesion is 60% stenosed.   Balloon angioplasty was performed using a BALLN SAPPHIRE [place] 3.5X8.   Post intervention, there is a 30% residual stenosis.   Successful PCI of the left main into the LCx with Shockwave therapy and aggressive balloon angioplasty. Significant improvement in stent expansion. There is occlusion of the first OM but this vessel was already severely diseased.      Plan: DAPT with ASA and Plavix indefinitely. Continue aggressive medical therapy for residual disease. Anticipate dialysis tomorrow.  LHC 09/08/2021  Ost LM to Dist LM lesion is 50% stenosed.   Prox LAD to Mid LAD lesion is 100% stenosed.   Ost LAD to Prox LAD lesion is 100% stenosed.   Ost Cx to Mid Cx lesion is 40% stenosed.   Mid RCA to Dist RCA lesion is 30% stenosed.   Prox RCA lesion is 100% stenosed.   Origin lesion is 20% stenosed.   Mid Graft lesion is 15% stenosed.   Prox Graft-1 lesion is 15% stenosed.   Origin to Prox Graft lesion before 1st Mrg  is 100% stenosed.   Origin to Prox Graft lesion is 35% stenosed.   1st Mrg-1 lesion is 99% stenosed.   1st Mrg-2 lesion is 75% stenosed.   LPAV lesion is 80% stenosed.   Non-stenotic Prox Graft-2 lesion was previously treated.   SVG.   SVG due to known occlusion.   LIMA and is normal in caliber.   The graft exhibits moderate focal disease.   LV end diastolic pressure is normal.   Severe 3 vessel obstructive CAD. Co dominant circulation.  The prior angioplasty site of the left main and LCx is improved with 50% residual. This was 90% before.  Patent LIMA to the LAD. 30% in stent disease in the ostial LIMA Occluded SVG sequential to the OM1 and left PDA- chronic  Patent SVG to the RCA. Stents with mild nonobstructive disease. Patent stent in the left subclavian. There is a 20 mm Hg pressure gradient. But the stent appears to be widely patent.  Normal LVEDP   Plan: would recommend continued medical  therapy. There are no suitable targets for PCI and prior PCI sites are patent  Patient Profile     65 year old female with extensive revascularization history (obstructive RCA with diseased SVG, Obstructive LM with LIMA --> LAD and prior subclavian stent, occluded SVG --> OM1, LM 99% s/p POBA of ISR 08/26/21 with relook today for refractory angina with no intervention) now with recurrent chest pain, s/p LHC 09/08/21 with chest pain recurrent and diffuse ST depression, interventional now discussing taking her back to the lab  Assessment & Plan    1  Hypotension   BP has been extremely low even on arrival to dialysis  60s to 80s.  Just doing ultrafiltration.    I have increased midodrine to TID QUestion why her BP is so low    Was 145/ when I saw her in ED on 09/08/21  Hgb is  7.9  was 10.7 on admit   Will get abdominal CT to r/o hematoma  2  CAD  Pt with severe dz.  Presented with NSTEMI:   Pt has had 2 LHC (both via R groin with USN guidance) this admit with intervention on 12/23 to LM   Continue aspirin, Plavix, Ranexa, statin.  Intolerant to beta-blockers due to hypotension. I have reviewed with [place].  Not a candidate for further intervention.  2.  ESRD: No fluid removal today   Ultrafiltration stopped early.   4.  Hyperlipidemia: LDL was 21 on 08/27/2021.  Continue atorvastatin 80 mg, Zetia 10 mg daily.  5.  Type 2 diabetes  On insulin    For questions or updates, please contact [place] Please consult [email or web address] for contact info under        Signed, Dorris Carnes, MD  09/12/2021, 10:17 AM

## 2021-09-12 NOTE — Progress Notes (Signed)
[facility] Progress Note    Assessment/ Plan:   # ESRD:  - didn't get HD over the weekend - HD today upstairs, got most of her session - may need CRRT   # Hypotension - unsure exact etiology, doesn't look septic, could be bleeding, could be cardiogenic. Cardiology is assessing.    # Chest Pain/unstable angina, recent NSTEMI -per cardiology. S/p card cath 12/22. On nitro gtt -s/p 12/23 PCI to left main into Lcx w/ balloon angio and shockwave therapy, first OM not salvageable (severe disease)   # Volume/ hypertension: EDW 93.5kg - up 4-5kg by wts, no UF due to hypotension today   # Anemia of Chronic Kidney Disease: Hemoglobin 8.0. on esa   # Secondary Hyperparathyroidism/Hyperphosphatemia: monitor phos. renal diet, will start binders if needed  #Hyponatremia -managing with HD, 137Na bath   # Vascular access: RIJ TDC, due for 2nd stage rue avf surgery-can likely pursue this as an outpatient unless she has a prolonged stay   Kelly Splinter, MD 09/12/2021, 1:10 PM   OP HD: TTS [facility]  4h 35min  2/2.5 bath  400/500 93.5kg  Hep none  RIJ TDC  - mircera 30 q4  - venofer 50 weekly  - calcitriol 0.5 ug tiw po      Subjective:   Seen in HD, BP's 60's initially when put on, then improved to mid 80's, weren't able to pull  Any fluid off. She got CP towards the ends and came off, sent to ICU by cardiology for hypotension,  With chest pain, wheezing.    Objective:   BP (!) 70/33    Pulse (!) 48    Temp 98.4 F (36.9 C) (Oral)    Resp 18    Ht 5\' 5"  (1.651 m)    Wt 98.4 kg    SpO2 (!) 86%    BMI 36.10 kg/m   Intake/Output Summary (Last 24 hours) at 09/12/2021 1310 Last data filed at 09/12/2021 1019 Gross per 24 hour  Intake 860.97 ml  Output -668 ml  Net 1528.97 ml    Weight change: 0.272 kg  Physical Exam: Gen:NAD, laying flat in bed [id number], +systolic murmur Resp:cta bl, normal wob GGY:IRSW Ext: no sig edema Neuro: awake, alert Dialysis access:  RUE AVF, RIJ TDC (active access)  Imaging: No results found.  Labs: BMET Recent Labs  Lab 09/08/21 1058 09/09/21 0251 09/09/21 1806 09/10/21 0119 09/11/21 0327 09/12/21 0456  NA 134* 134* 131* 131* 129* 132*  K 4.2 3.9 4.1 4.8 5.1 5.2*  CL 97* 96* 95* 94* 93* 96*  CO2 19* 27 25 25 23  21*  GLUCOSE 246* 167* 244* 225* 190* 215*  BUN 40* 20 29* 34* 49* 64*  CREATININE 6.29* 4.31* 5.42* 5.93* 7.45* 8.39*  CALCIUM 9.7 8.6* 8.5* 9.1 8.9 9.0  PHOS  --   --  5.9*  --   --   --     CBC Recent Labs  Lab 09/09/21 0251 09/10/21 0119 09/11/21 0327 09/12/21 0456  WBC 10.0 9.9 10.2 11.0*  HGB 9.2* 8.9* 8.0* 7.9*  HCT 29.2* 28.6* 24.4* 24.8*  MCV 108.1* 108.3* 107.5* 106.9*  PLT 170 173 149* 153     Medications:     allopurinol  100 mg Oral Q T,Th,Sa-HD   ALPRAZolam  0.25 mg Oral q morning   ALPRAZolam  0.5 mg Oral QHS   aspirin EC  81 mg Oral Daily   atorvastatin  80 mg Oral QHS   Chlorhexidine Gluconate Cloth  6 each Topical Daily   clopidogrel  75 mg Oral Daily   darbepoetin (ARANESP) injection - DIALYSIS  40 mcg Intravenous Q Thu-HD   EPINEPHrine       ezetimibe  10 mg Oral QHS   ferric citrate  420 mg Oral TID WC   fluticasone furoate-vilanterol  1 puff Inhalation Daily   gabapentin  400 mg Oral QHS   heparin  5,000 Units Subcutaneous Q8H   insulin aspart  0-15 Units Subcutaneous TID WC   insulin aspart  0-5 Units Subcutaneous QHS   insulin aspart protamine- aspart  24 Units Subcutaneous BID WC   isosorbide mononitrate  15 mg Oral Daily   levothyroxine  125 mcg Oral QAC breakfast   midodrine  10 mg Oral 3 times per day   morphine  2 mg Intravenous Once   pantoprazole  40 mg Oral BID   ranolazine  500 mg Oral QHS

## 2021-09-12 NOTE — Significant Event (Signed)
Rapid Response Event Note   Reason for Call :  Called originally at [place] for hypotension SBP-60s. MD had ordered 250cc NS bolus and it was infusing. Called again at 0139 d/t continued hypotension despite 250cc NS bolus. Additional 250cc NS bolus ordered by MD.  Initial Focused Assessment:  Pt lying in bed with eyes closed, in no distress. At beginning of assessment, pt would wake up to voice, answer some questions, follow commands, and move all extremities, but was very lethargic and unable to stay awake. After speaking with the pt a few minutes, her neuro status was back at baseline and she was able to stay awake. Pt denies chest pain/SOB. Skin warm and dry  T-98.9, HR-71, SBP-80s, RR-16, SpO2-98% on 3LNC.    Interventions:  250cc NS bolus x 2(ordered and started PTA RRT)  Plan of Care:  Pt is more awake after interventions. SBP now 80s. Continue to monitor pt closely. Call RRT if further assistance needed.   Event Summary:   MD Notified: Dr. Paticia Stack Call Time:0120 Arrival Time:0150 End Time:0220  Dillard Essex, RN

## 2021-09-12 NOTE — Progress Notes (Signed)
Progress Note  Patient Name: Donna Howe Date of Encounter: 09/13/2021  [facility] Cardiologist: Rozann Lesches, MD   Subjective   Patient became hypoxic and more hypotensive yesterday with associated nausea/vomiting requiring initiation of levophed, NRB and transfer to ICU. CT abdomen/pelvis demonstrated inflammation in the right upper quadrant adjacent to the distal stomach, proximal duodenum and cholecystectomy clips. In addition, concern for extraluminal locules of gas adjacent to the descending duodenum. Constellation offindings concerning for peptic ulcer disease with possible perforated ulcer. General surgery consulted and given abdominal examination was benign, thought symptoms more likely due to cardiogenic shock vs septic. Recommended NPO, IV ABX and BID PPI.   TTE showed newly reduced LVEF 50-55%>35-40%, severe MR.  Currently, patient has some nausea and intermittent chest pain. No SOB. Remains on levo 4.  Inpatient Medications    Scheduled Meds:  allopurinol  100 mg Oral Q T,Th,Sa-HD   ALPRAZolam  0.25 mg Oral q morning   ALPRAZolam  0.5 mg Oral QHS   aspirin EC  81 mg Oral Daily   atorvastatin  80 mg Oral QHS   Chlorhexidine Gluconate Cloth  6 each Topical Daily   Chlorhexidine Gluconate Cloth  6 each Topical Q0600   clopidogrel  75 mg Oral Daily   darbepoetin (ARANESP) injection - DIALYSIS  40 mcg Intravenous Q Thu-HD   ezetimibe  10 mg Oral QHS   ferric citrate  420 mg Oral TID WC   fluticasone furoate-vilanterol  1 puff Inhalation Daily   gabapentin  400 mg Oral QHS   heparin  5,000 Units Subcutaneous Q8H   insulin aspart  0-15 Units Subcutaneous TID WC   insulin aspart  0-5 Units Subcutaneous QHS   isosorbide mononitrate  15 mg Oral Daily   levothyroxine  125 mcg Oral QAC breakfast   midodrine  10 mg Oral 3 times per day   pantoprazole (PROTONIX) IV  40 mg Intravenous Q12H   pantoprazole (PROTONIX) IV  40 mg Intravenous Q12H   ranolazine  500 mg  Oral QHS   sodium zirconium cyclosilicate  10 g Oral TID   Continuous Infusions:  sodium chloride 10 mL/hr at 09/13/21 0700   cefTRIAXone (ROCEPHIN)  IV Stopped (09/12/21 1810)   metronidazole Stopped (09/13/21 0629)   norepinephrine (LEVOPHED) Adult infusion 4 mcg/min (09/13/21 0700)   PRN Meds: acetaminophen, albuterol, HYDROmorphone (DILAUDID) injection, ipratropium, nitroGLYCERIN, ondansetron (ZOFRAN) IV, ondansetron   Vital Signs    Vitals:   09/13/21 0615 09/13/21 0630 09/13/21 0645 09/13/21 0700  BP:      Pulse: 61 (!) 59 60 60  Resp: 12 10 11 12   Temp:    (!) 97.1 F (36.2 C)  TempSrc:    Oral  SpO2: 92% 91% 98% 91%  Weight:      Height:        Intake/Output Summary (Last 24 hours) at 09/13/2021 0837 Last data filed at 09/13/2021 0700 Gross per 24 hour  Intake 806.4 ml  Output -668 ml  Net 1474.4 ml   Last 3 Weights 09/13/2021 09/12/2021 09/11/2021  Weight (lbs) 221 lb 5.5 oz 216 lb 14.9 oz 214 lb 4.8 oz  Weight (kg) 100.4 kg 98.4 kg 97.206 kg      Telemetry    NSR - Personally Reviewed  ECG    No new tracing today - Personally Reviewed  Physical Exam   GEN: Chronically ill appearing, NAD  Neck: Supple Cardiac: RR, 2/6 systolic murmur Respiratory: Crackles at the bases bilaterally GI: Soft, nontender  to palpation, nondistended MS: No edema; No deformity. Neuro:  Nonfocal  Psych: Normal affect   Labs    High Sensitivity Troponin:   Recent Labs  Lab 09/08/21 1034 09/08/21 1210 09/09/21 0251 09/09/21 0631 09/09/21 2019  TROPONINIHS 134* 173* 1,641* 2,762* 7,276*     Chemistry Recent Labs  Lab 09/09/21 0251 09/09/21 1806 09/10/21 0119 09/11/21 0327 09/12/21 0456 09/13/21 0521  NA 134* 131*   < > 129* 132* 133*  K 3.9 4.1   < > 5.1 5.2* 5.6*  CL 96* 95*   < > 93* 96* 97*  CO2 27 25   < > 23 21* 21*  GLUCOSE 167* 244*   < > 190* 215* 217*  BUN 20 29*   < > 49* 64* 45*  CREATININE 4.31* 5.42*   < > 7.45* 8.39* 6.03*  CALCIUM 8.6*  8.5*   < > 8.9 9.0 8.7*  PROT 6.2*  --   --   --   --   --   ALBUMIN 3.6 3.1*  --   --   --   --   AST 85*  --   --   --   --   --   ALT 45*  --   --   --   --   --   ALKPHOS 78  --   --   --   --   --   BILITOT 0.9  --   --   --   --   --   GFRNONAA 11* 8*   < > 6* 5* 7*  ANIONGAP 11 11   < > 13 15 15    < > = values in this interval not displayed.    Lipids No results for input(s): CHOL, TRIG, HDL, LABVLDL, LDLCALC, CHOLHDL in the last 168 hours.  Hematology Recent Labs  Lab 09/12/21 0456 09/12/21 1456 09/13/21 0521  WBC 11.0* 14.8* 14.6*  RBC 2.32* 2.34* 2.43*  HGB 7.9* 8.3* 8.4*  HCT 24.8* 25.4* 26.4*  MCV 106.9* 108.5* 108.6*  MCH 34.1* 35.5* 34.6*  MCHC 31.9 32.7 31.8  RDW 14.6 14.9 15.1  PLT 153 199 168   Thyroid No results for input(s): TSH, FREET4 in the last 168 hours.  BNP Recent Labs  Lab 09/09/21 0251  BNP 3,297.0*    DDimer No results for input(s): DDIMER in the last 168 hours.   Radiology    CT ABDOMEN PELVIS WO CONTRAST  Result Date: 09/12/2021 CLINICAL DATA:  Right upper quadrant pain nondiagnostic ultrasound EXAM: CT ABDOMEN AND PELVIS WITHOUT CONTRAST TECHNIQUE: Multidetector CT imaging of the abdomen and pelvis was performed following the standard protocol without IV contrast. COMPARISON:  None. FINDINGS: Lower chest: Small bilateral pleural effusions with compressive atelectasis of lower lobes. Heart is mildly enlarged. Scattered calcifications in the coronary arteries and aorta. Hepatobiliary: Prior cholecystectomy.  No focal hepatic abnormality. Pancreas: No focal abnormality or ductal dilatation. Spleen: No focal abnormality.  Normal size. Adrenals/Urinary Tract: Atrophic kidneys bilaterally. No hydronephrosis. No adrenal mass. Urinary bladder contains contrast, question source. No bladder wall abnormality. Stomach/Bowel: Normal appendix. No bowel obstruction. There is mild inflammation in the right upper quadrant adjacent to the cholecystectomy clips.  This possibly is related to the distal stomach or proximal duodenum. In addition, there are several small locules of gas adjacent to the descending colon (image 35 series 3). No diverticula seen in this area on prior study. This is concerning for extraluminal gas. Constellation of findings suggest possibility  of peptic ulcer disease and perforated ulcer. Vascular/Lymphatic: Aortic atherosclerosis. No evidence of aneurysm or adenopathy. Reproductive: Uterus and adnexa unremarkable.  No mass. Other: No free fluid or free air. Musculoskeletal: No acute bony abnormality. IMPRESSION: Inflammation in the right upper quadrant adjacent to the distal stomach, proximal duodenum and cholecystectomy clips. This was not present on prior study. In addition, concern for extraluminal locules of gas adjacent to the descending duodenum. Constellation of findings concerning for peptic ulcer disease with possible perforated ulcer. Small bilateral pleural effusions with bibasilar atelectasis. Aortic atherosclerosis. Atrophic kidneys bilaterally. Contrast is seen within the urinary bladder of unknown source (I do not see a recent contrast enhanced CT). These results were called by telephone at the time of interpretation on 09/12/2021 at 2:46 pm to provider Dr. Lynetta Mare, Who verbally acknowledged these results. Electronically Signed   By: Rolm Baptise M.D.   On: 09/12/2021 14:46   DG CHEST PORT 1 VIEW  Result Date: 09/12/2021 CLINICAL DATA:  Chest pain.  Follow-up. EXAM: PORTABLE CHEST 1 VIEW COMPARISON:  09/08/2021 FINDINGS: Previous median sternotomy. Right internal jugular central line with tips at the SVC RA junction or proximal right atrium. Worsened congestive heart failure with worsened pulmonary edema. No visible pleural effusion. IMPRESSION: Worsened diffuse pulmonary edema. Electronically Signed   By: Nelson Chimes M.D.   On: 09/12/2021 14:57   ECHOCARDIOGRAM LIMITED  Result Date: 09/12/2021    ECHOCARDIOGRAM LIMITED  REPORT   Patient Name:   Donna Howe Date of Exam: 09/12/2021 Medical Rec #:  [id number]        Height:       65.0 in Accession #:    [id number]       Weight:       216.9 lb Date of Birth:  1956-05-10       BSA:          2.048 m Patient Age:    34 years         BP:           95/25 mmHg Patient Gender: F                HR:           651 bpm. Exam Location:  Inpatient Procedure: Limited Echo, Color Doppler and Cardiac Doppler Indications:    Chest Pain R07.9  History:        Patient has prior history of Echocardiogram examinations, most                 recent 06/15/2021. CHF, CAD and Previous Myocardial Infarction,                 PAD, Arrythmias:Bradycardia; Risk Factors:Hypertension,                 Dyslipidemia and Diabetes. End stage renal disease.  Sonographer:    Darlina Sicilian RDCS Referring Phys: [id number] PAULA V ROSS IMPRESSIONS  1. Left ventricular ejection fraction, by estimation, is 35 to 40%. The left ventricle has moderately decreased function. The left ventricle demonstrates global hypokinesis. The left ventricular internal cavity size was mildly dilated. There is mild concentric left ventricular hypertrophy. Left ventricular diastolic parameters are consistent with Grade II diastolic dysfunction (pseudonormalization). Elevated left atrial pressure. There is global left ventricular hypokinesis with severe lateral wall hypokinesis.  2. Right ventricular systolic function is normal. The right ventricular size is normal. Tricuspid regurgitation signal is inadequate for assessing PA pressure.  3. Left atrial size was mildly  dilated.  4. Severe mitral valve regurgitation.  5. The aortic valve is tricuspid. There is mild calcification of the aortic valve. Aortic valve regurgitation is not visualized. Aortic valve sclerosis is present, with no evidence of aortic valve stenosis. Comparison(s): Prior images reviewed side by side. The left ventricular function is significantly worse. The left ventricular wall  motion abnormality is new. There is new severe mitral insufficiency due to ischemic posterior leaflet tethering. FINDINGS  Left Ventricle: Left ventricular ejection fraction, by estimation, is 35 to 40%. The left ventricle has moderately decreased function. The left ventricle demonstrates global hypokinesis. The left ventricular internal cavity size was mildly dilated. There is mild concentric left ventricular hypertrophy. Left ventricular diastolic parameters are consistent with Grade II diastolic dysfunction (pseudonormalization). Elevated left atrial pressure.  LV Wall Scoring: The antero-lateral wall and posterior wall are hypokinetic. There is global left ventricular hypokinesis with severe lateral wall hypokinesis. Right Ventricle: The right ventricular size is normal. No increase in right ventricular wall thickness. Right ventricular systolic function is normal. Tricuspid regurgitation signal is inadequate for assessing PA pressure. Left Atrium: Left atrial size was mildly dilated. Right Atrium: Right atrial size was normal in size. Pericardium: There is no evidence of pericardial effusion. Mitral Valve: Mild to moderate mitral annular calcification. Severe mitral valve regurgitation, with eccentric laterally directed jet. Tricuspid Valve: The tricuspid valve is normal in structure. Tricuspid valve regurgitation is trivial. Aortic Valve: The aortic valve is tricuspid. There is mild calcification of the aortic valve. Aortic valve regurgitation is not visualized. Aortic valve sclerosis is present, with no evidence of aortic valve stenosis. Pulmonic Valve: The pulmonic valve was not well visualized. Pulmonic valve regurgitation is not visualized. Aorta: The aortic root is normal in size and structure. IAS/Shunts: No atrial level shunt detected by color flow Doppler. LEFT VENTRICLE PLAX 2D LVIDd:         5.68 cm LVIDs:         5.04 cm LV PW:         1.24 cm LV IVS:        1.30 cm LVOT diam:     1.95 cm LV SV:          58 LV SV Index:   28 LVOT Area:     2.98 cm  AORTIC VALVE LVOT Vmax:   120.62 cm/s LVOT Vmean:  71.701 cm/s LVOT VTI:    0.194 m MITRAL VALVE MV Area (plan): 2.62 cm  SHUNTS                           Systemic VTI:  0.19 m                           Systemic Diam: 1.95 cm Sanda Klein MD Electronically signed by Sanda Klein MD Signature Date/Time: 09/12/2021/2:39:01 PM    Final     Cardiac Studies    CT Abdomen/Pelvis 09/12/21: IMPRESSION: Inflammation in the right upper quadrant adjacent to the distal stomach, proximal duodenum and cholecystectomy clips. This was not present on prior study. In addition, concern for extraluminal locules of gas adjacent to the descending duodenum. Constellation of findings concerning for peptic ulcer disease with possible perforated ulcer.   Small bilateral pleural effusions with bibasilar atelectasis.   Aortic atherosclerosis.   Atrophic kidneys bilaterally. Contrast is seen within the urinary bladder of unknown source (I do not see a recent contrast enhanced CT).  These results were called by telephone at the time of interpretation on 09/12/2021 at 2:46 pm to provider Dr. Lynetta Mare, Who verbally acknowledged these results.  TTE 09/12/21: IMPRESSIONS     1. Left ventricular ejection fraction, by estimation, is 35 to 40%. The  left ventricle has moderately decreased function. The left ventricle  demonstrates global hypokinesis. The left ventricular internal cavity size  was mildly dilated. There is mild  concentric left ventricular hypertrophy. Left ventricular diastolic  parameters are consistent with Grade II diastolic dysfunction  (pseudonormalization). Elevated left atrial pressure. There is global left  ventricular hypokinesis with severe lateral wall  hypokinesis.   2. Right ventricular systolic function is normal. The right ventricular  size is normal. Tricuspid regurgitation signal is inadequate for assessing  PA pressure.    3. Left atrial size was mildly dilated.   4. Severe mitral valve regurgitation.   5. The aortic valve is tricuspid. There is mild calcification of the  aortic valve. Aortic valve regurgitation is not visualized. Aortic valve  sclerosis is present, with no evidence of aortic valve stenosis.   Comparison(s): Prior images reviewed side by side. The left ventricular  function is significantly worse. The left ventricular wall motion  abnormality is new. There is new severe mitral insufficiency due to  ischemic posterior leaflet tethering.    LHC 09/09/21       Ost LM to Dist LM lesion is 60% stenosed.   Balloon angioplasty was performed using a BALLN SAPPHIRE [place] 3.5X8.   Post intervention, there is a 30% residual stenosis.   Successful PCI of the left main into the LCx with Shockwave therapy and aggressive balloon angioplasty. Significant improvement in stent expansion. There is occlusion of the first OM but this vessel was already severely diseased.      Plan: DAPT with ASA and Plavix indefinitely. Continue aggressive medical therapy for residual disease. Anticipate dialysis tomorrow.  LHC 09/08/2021   Ost LM to Dist LM lesion is 50% stenosed.   Prox LAD to Mid LAD lesion is 100% stenosed.   Ost LAD to Prox LAD lesion is 100% stenosed.   Ost Cx to Mid Cx lesion is 40% stenosed.   Mid RCA to Dist RCA lesion is 30% stenosed.   Prox RCA lesion is 100% stenosed.   Origin lesion is 20% stenosed.   Mid Graft lesion is 15% stenosed.   Prox Graft-1 lesion is 15% stenosed.   Origin to Prox Graft lesion before 1st Mrg  is 100% stenosed.   Origin to Prox Graft lesion is 35% stenosed.   1st Mrg-1 lesion is 99% stenosed.   1st Mrg-2 lesion is 75% stenosed.   LPAV lesion is 80% stenosed.   Non-stenotic Prox Graft-2 lesion was previously treated.   SVG.   SVG due to known occlusion.   LIMA and is normal in caliber.   The graft exhibits moderate focal disease.   LV end diastolic pressure is  normal.   Severe 3 vessel obstructive CAD. Co dominant circulation.  The prior angioplasty site of the left main and LCx is improved with 50% residual. This was 90% before.  Patent LIMA to the LAD. 30% in stent disease in the ostial LIMA Occluded SVG sequential to the OM1 and left PDA- chronic Patent SVG to the RCA. Stents with mild nonobstructive disease. Patent stent in the left subclavian. There is a 20 mm Hg pressure gradient. But the stent appears to be widely patent.  Normal LVEDP   Plan: would  recommend continued medical therapy. There are no suitable targets for PCI and prior PCI sites are patent  Patient Profile     65 y.o. female with extensive revascularization history (obstructive RCA with diseased SVG, Obstructive LM with LIMA --> LAD and prior subclavian stent, occluded SVG --> OM1, LM 99% s/p POBA of ISR 08/26/21) now with recurrent chest pain, s/p LHC 09/08/21 with extensive disease as detailed above and PCI to LM on 12/23. Continues to have intermittent chest pain despite intervention on LM deemed to not be a candidate for further revascularization.  Assessment & Plan    #Shock: Thought to be most likely cardiogenic in nature given benign abdominal examination with lower suspicion of underlying sepsis. As detailed below, patient has extensive native vessel CAD s/p bypass and recent PCI to LM on 12/23. Deemed not a candidate for further intervention. TTE 12/26 with drop in EF from 50-55% to 35-40% with anterolateral and inferior wall hypokinesis, severe MR secondary to posterior leaflet tethering, G2DD, normal RV. Unfortunately, patient has limited options due to no further PCI targets and not a surgical candidate. Will continue with medical management at this time and recommend evaluation by palliative care team. -Continue medical management of CAD as detailed below -Continue levophed for goal MAP>65 -Surgery following for possible contained perforated peptic ulcer -Volume  management with HD -Given limited options and overall poor prognosis, palliative care consulted (discussed with the patient today)  #Extensive Multivessel CAD: Cath 12/22 with severe 3 vessel native disease as detailed above. Patent LIMA-LAD. Occluded sequential SVG-OM and PDA, Patent SVG-RCA. Patent subclavian stent. Underwent successful PCI to LM on 12/23. Per discussion with Interventional, patient is not a candidate for further intervention. -Continue ASA 81mg  daily -Continue plavix 75mg  daily -Continue lipitor 80mg , zetia 10mg  daily -Continue ranexa 500mg  daily (dosed for ESRD) -Will stop imdur due to hypotension -Cannot tolerate BB, ARB due to hypotension -Palliative care consulted; appreciate recommendations  #Severe MR: Likely ischemic MR with tethering of the posterior leaflet. Not a candidate for further revascularization or MVR. Will need to continue with medical management as able. Palliative care consulted as above. -Continue medical management as not a candidate for further intervention or surgery -Volume management with HD -Palliative care consulted as above  #Possible contained perforated PUD: CT 12/26 with inflammation RUQ, extraluminal gas adjacent to descending duodenum with no free fluid/air. Surgery consulted and given benign abdominal exam, currently recommending conservative management with NPO, PPI and ABX. -Management per general surgery  #ESRD on HD: -Renal following, appreciate recommendations -Likely needs CRRT due to need for pressor support  #HLD: -Continue lipitor 80mg  daily  #DMII: -ISS -Start levemir 10mg  BID  CRITICAL CARE TIME: I have spent a total of 45 minutes with patient reviewing hospital notes, telemetry, EKGs, labs and examining the patient as well as establishing an assessment and plan that was discussed with the patient.  > 50% of time was spent in direct patient care. The patient is critically ill with multi-organ system failure and  requires high complexity decision making for assessment and support, frequent evaluation and titration of therapies, application of advanced monitoring technologies and extensive interpretation of multiple databases.   For questions or updates, please contact [place] Please consult [email or web address] for contact info under        Signed, Freada Bergeron, MD  09/13/2021, 8:37 AM

## 2021-09-12 NOTE — Procedures (Signed)
Central Venous Catheter Insertion Procedure Note  Donna Howe  [id number]  07-Jun-1956  Date:09/12/21  Time:4:03 PM   Provider Performing:Laylah Riga   Procedure: Insertion of Non-tunneled Central Venous [phone]) with US guidance (68088)   Indication(s) Medication administration and Difficult access  Consent Risks of the procedure as well as the alternatives and risks of each were explained to the patient and/or caregiver.  Consent for the procedure was obtained and is signed in the bedside chart  Anesthesia Topical only with 1% lidocaine   Timeout Verified patient identification, verified procedure, site/side was marked, verified correct patient position, special equipment/implants available, medications/allergies/relevant history reviewed, required imaging and test results available.  Sterile Technique Maximal sterile technique including full sterile barrier drape, hand hygiene, sterile gown, sterile gloves, mask, hair covering, sterile ultrasound probe cover (if used).  Procedure Description Area of catheter insertion was cleaned with chlorhexidine and draped in sterile fashion.  With real-time ultrasound guidance a central venous catheter was placed into the left femoral vein. Nonpulsatile blood flow and easy flushing noted in all ports.  The catheter was sutured in place and sterile dressing applied.  Complications/Tolerance None; patient tolerated the procedure well. Chest X-ray is ordered to verify placement for internal jugular or subclavian cannulation.   Chest x-ray is not ordered for femoral cannulation.  EBL Minimal  Kipp Brood, MD [facility] ICU Physician [place]  Pager: [phone] Or Epic Secure Chat After hours: [phone].  09/12/2021, 4:04 PM

## 2021-09-12 NOTE — Significant Event (Signed)
Rapid Response Event Note   Reason for Call :  Hypotension 59/37  Initial Focused Assessment:  Patient is drowsy but conversing.  Narcan is currently being given IV. BP  70/42  SR 64  RR  22  difficult to obtain O2 sat, attempted multiple pulse ox sites.  O2 sats 60s-80s  placed on NRB Lung sounds diminished,  Patient with weak cough Occasionally with good O2 waveform O2 sat 98-100%   Dr Harrington Challenger at bedside  Interventions:  500cc NS bolus BP 95/24  HR 62 Patient continues to be drowsy but able to answer questions some conversation.  Levophed gtt started at 5 mcg Increased to 10 mcg  2D echo being performed  Transported in bed to [place] Patient has her personal cell phone and charging cord in the bed with her.   Plan of Care:  CT abdomen pelvis ICU care   Event Summary:   MD Notified: Dr Harrington Challenger at bedside Call Time: [place] Time: 6440 End Time: [place]  Raliegh Ip, RN

## 2021-09-12 NOTE — Progress Notes (Signed)
°  Echocardiogram 2D Echocardiogram limited has been performed.  Darlina Sicilian M 09/12/2021, 12:28 PM

## 2021-09-12 NOTE — Procedures (Signed)
Arterial Catheter Insertion Procedure Note  Donna Howe  [id number]  1955/09/29  Date:09/12/21  Time:4:04 PM    Provider Performing: Kipp Brood    Procedure: Insertion of Arterial Line [phone]) with US guidance (11031)   Indication(s) Blood pressure monitoring and/or need for frequent ABGs  Consent Unable to obtain consent due to emergent nature of procedure.  Anesthesia Lidocaine 1%   Time Out Verified patient identification, verified procedure, site/side was marked, verified correct patient position, special equipment/implants available, medications/allergies/relevant history reviewed, required imaging and test results available.   Sterile Technique Maximal sterile technique including full sterile barrier drape, hand hygiene, sterile gown, sterile gloves, mask, hair covering, sterile ultrasound probe cover (if used).   Procedure Description Area of catheter insertion was cleaned with chlorhexidine and draped in sterile fashion. With real-time ultrasound guidance an arterial catheter was placed into the left femoral artery.  Appropriate arterial tracings confirmed on monitor.     Complications/Tolerance None; patient tolerated the procedure well.   EBL Minimal   Specimen(s) None  .rasig

## 2021-09-13 DIAGNOSIS — Z7189 Other specified counseling: Secondary | ICD-10-CM | POA: Diagnosis not present

## 2021-09-13 DIAGNOSIS — I340 Nonrheumatic mitral (valve) insufficiency: Secondary | ICD-10-CM

## 2021-09-13 DIAGNOSIS — R579 Shock, unspecified: Secondary | ICD-10-CM

## 2021-09-13 DIAGNOSIS — Z515 Encounter for palliative care: Secondary | ICD-10-CM

## 2021-09-13 DIAGNOSIS — I9589 Other hypotension: Secondary | ICD-10-CM

## 2021-09-13 DIAGNOSIS — R079 Chest pain, unspecified: Secondary | ICD-10-CM | POA: Diagnosis not present

## 2021-09-13 DIAGNOSIS — I200 Unstable angina: Secondary | ICD-10-CM | POA: Diagnosis not present

## 2021-09-13 DIAGNOSIS — Z9861 Coronary angioplasty status: Secondary | ICD-10-CM

## 2021-09-13 DIAGNOSIS — R570 Cardiogenic shock: Secondary | ICD-10-CM

## 2021-09-13 DIAGNOSIS — E1122 Type 2 diabetes mellitus with diabetic chronic kidney disease: Secondary | ICD-10-CM

## 2021-09-13 DIAGNOSIS — I25110 Atherosclerotic heart disease of native coronary artery with unstable angina pectoris: Secondary | ICD-10-CM | POA: Diagnosis not present

## 2021-09-13 LAB — CBC
HCT: 26.4 % — ABNORMAL LOW (ref 36.0–46.0)
Hemoglobin: 8.4 g/dL — ABNORMAL LOW (ref 12.0–15.0)
MCH: 34.6 pg — ABNORMAL HIGH (ref 26.0–34.0)
MCHC: 31.8 g/dL (ref 30.0–36.0)
MCV: 108.6 fL — ABNORMAL HIGH (ref 80.0–100.0)
Platelets: 168 10*3/uL (ref 150–400)
RBC: 2.43 MIL/uL — ABNORMAL LOW (ref 3.87–5.11)
RDW: 15.1 % (ref 11.5–15.5)
WBC: 14.6 10*3/uL — ABNORMAL HIGH (ref 4.0–10.5)
nRBC: 1.9 % — ABNORMAL HIGH (ref 0.0–0.2)

## 2021-09-13 LAB — RENAL FUNCTION PANEL
Albumin: 2.9 g/dL — ABNORMAL LOW (ref 3.5–5.0)
Anion gap: 18 — ABNORMAL HIGH (ref 5–15)
BUN: 45 mg/dL — ABNORMAL HIGH (ref 8–23)
CO2: 18 mmol/L — ABNORMAL LOW (ref 22–32)
Calcium: 8.9 mg/dL (ref 8.9–10.3)
Chloride: 98 mmol/L (ref 98–111)
Creatinine, Ser: 5.5 mg/dL — ABNORMAL HIGH (ref 0.44–1.00)
GFR, Estimated: 8 mL/min — ABNORMAL LOW (ref >60)
Glucose, Bld: 99 mg/dL (ref 70–99)
Phosphorus: 5.7 mg/dL — ABNORMAL HIGH (ref 2.5–4.6)
Potassium: 5.1 mmol/L (ref 3.5–5.1)
Sodium: 134 mmol/L — ABNORMAL LOW (ref 135–145)

## 2021-09-13 LAB — BASIC METABOLIC PANEL
Anion gap: 15 (ref 5–15)
BUN: 45 mg/dL — ABNORMAL HIGH (ref 8–23)
CO2: 21 mmol/L — ABNORMAL LOW (ref 22–32)
Calcium: 8.7 mg/dL — ABNORMAL LOW (ref 8.9–10.3)
Chloride: 97 mmol/L — ABNORMAL LOW (ref 98–111)
Creatinine, Ser: 6.03 mg/dL — ABNORMAL HIGH (ref 0.44–1.00)
GFR, Estimated: 7 mL/min — ABNORMAL LOW (ref >60)
Glucose, Bld: 217 mg/dL — ABNORMAL HIGH (ref 70–99)
Potassium: 5.6 mmol/L — ABNORMAL HIGH (ref 3.5–5.1)
Sodium: 133 mmol/L — ABNORMAL LOW (ref 135–145)

## 2021-09-13 LAB — HEPATIC FUNCTION PANEL
ALT: 156 U/L — ABNORMAL HIGH (ref 0–44)
AST: 224 U/L — ABNORMAL HIGH (ref 15–41)
Albumin: 2.9 g/dL — ABNORMAL LOW (ref 3.5–5.0)
Alkaline Phosphatase: 214 U/L — ABNORMAL HIGH (ref 38–126)
Bilirubin, Direct: 0.6 mg/dL — ABNORMAL HIGH (ref 0.0–0.2)
Indirect Bilirubin: 0.7 mg/dL (ref 0.3–0.9)
Total Bilirubin: 1.3 mg/dL — ABNORMAL HIGH (ref 0.3–1.2)
Total Protein: 5.7 g/dL — ABNORMAL LOW (ref 6.5–8.1)

## 2021-09-13 LAB — POCT ACTIVATED CLOTTING TIME: Activated Clotting Time: 155 seconds

## 2021-09-13 LAB — GLUCOSE, CAPILLARY
Glucose-Capillary: 114 mg/dL — ABNORMAL HIGH (ref 70–99)
Glucose-Capillary: 148 mg/dL — ABNORMAL HIGH (ref 70–99)
Glucose-Capillary: 190 mg/dL — ABNORMAL HIGH (ref 70–99)
Glucose-Capillary: 86 mg/dL (ref 70–99)
Glucose-Capillary: 97 mg/dL (ref 70–99)
Glucose-Capillary: 98 mg/dL (ref 70–99)

## 2021-09-13 LAB — LACTIC ACID, PLASMA: Lactic Acid, Venous: 6.9 mmol/L (ref 0.5–1.9)

## 2021-09-13 MED ORDER — SODIUM CHLORIDE 0.9 % IV SOLN
500.0000 [IU]/h | INTRAVENOUS | Status: DC
  Administered 2021-09-13 – 2021-09-15 (×5): 500 [IU]/h via INTRAVENOUS_CENTRAL
  Filled 2021-09-13 (×3): qty 2
  Filled 2021-09-13: qty 10000

## 2021-09-13 MED ORDER — PRISMASOL BGK 4/2.5 32-4-2.5 MEQ/L EC SOLN: Status: DC

## 2021-09-13 MED ORDER — ALTEPLASE 2 MG IJ SOLR: 2.0000 mg | Freq: Once | INTRAMUSCULAR | Status: DC | PRN

## 2021-09-13 MED ORDER — DEXTROSE 10 % IV SOLN: INTRAVENOUS | Status: DC

## 2021-09-13 MED ORDER — INSULIN DETEMIR 100 UNIT/ML ~~LOC~~ SOLN
10.0000 [IU] | Freq: Two times a day (BID) | SUBCUTANEOUS | Status: DC
  Administered 2021-09-13 (×2): 10 [IU] via SUBCUTANEOUS
  Filled 2021-09-13 (×4): qty 0.1

## 2021-09-13 MED ORDER — PRISMASOL BGK 4/2.5 32-4-2.5 MEQ/L REPLACEMENT SOLN: Status: DC

## 2021-09-13 MED ORDER — HEPARIN SODIUM (PORCINE) 1000 UNIT/ML DIALYSIS
1000.0000 [IU] | INTRAMUSCULAR | Status: DC | PRN
  Administered 2021-09-16: 15:00:00 4000 [IU] via INTRAVENOUS_CENTRAL
  Filled 2021-09-13: qty 4
  Filled 2021-09-13: qty 6
  Filled 2021-09-13: qty 4
  Filled 2021-09-13: qty 6

## 2021-09-13 MED ORDER — SODIUM CHLORIDE 0.9 % IV SOLN
12.5000 mg | Freq: Four times a day (QID) | INTRAVENOUS | Status: DC | PRN
  Administered 2021-09-14 – 2021-09-20 (×6): 12.5 mg via INTRAVENOUS
  Filled 2021-09-13 (×3): qty 0.5
  Filled 2021-09-13: qty 12.5
  Filled 2021-09-13 (×4): qty 0.5

## 2021-09-13 MED ORDER — NOREPINEPHRINE 4 MG/250ML-% IV SOLN
0.0000 ug/min | INTRAVENOUS | Status: DC
  Administered 2021-09-13: 17:00:00 2 ug/min via INTRAVENOUS
  Filled 2021-09-13: qty 250

## 2021-09-13 MED ORDER — SODIUM ZIRCONIUM CYCLOSILICATE 10 G PO PACK
10.0000 g | PACK | Freq: Three times a day (TID) | ORAL | Status: AC
  Administered 2021-09-13: 11:00:00 10 g via ORAL
  Filled 2021-09-13 (×2): qty 1

## 2021-09-13 MED ORDER — SODIUM CHLORIDE 0.9 % FOR CRRT: INTRAVENOUS_CENTRAL | Status: DC | PRN

## 2021-09-13 NOTE — Consult Note (Signed)
° °  [facility] Inpatient Consult   09/13/2021  Annaston Upham Freedman 03-Dec-1955 [id number]  [facility] [ACO] Patient: Donna Howe Medicare  Primary Care Provider:  Curlene Labrum, MD, [facility]  Request for review:  Patient screened for readmission less than 30 days hospitalization with noted high risk score for unplanned readmission risk and  to assess for potential [place] Management service needs for post hospital transition.  Review of patient's medical record reveals patient is currently in ICU level of care.   Plan:  Continue to follow progress and disposition to assess for post hospital care management needs.    For questions contact:   Natividad Brood, RN BSN [facility] Liaison  [phone] business mobile phone Toll free office [phone]  Fax number: [phone] [email or web address]      .

## 2021-09-13 NOTE — Consult Note (Addendum)
° °Palliative Care Consult Note  °                                °Date: 09/13/2021  ° °Patient Name: Donna Howe  °DOB: 07/18/1956  MRN: [id number]  Age / Sex: 65 y.o., female  °PCP: Burdine, Steven E, MD °Referring Physician: Smith, Daniel C, MD ° °Reason for Consultation: Establishing goals of care ° °HPI/Patient Profile: 65 y.o. female  with past medical history of  status post CABG in 2005 with multiple PCI's since, multiple NSTEMI's and has been deemed too high risk for CABG re-do. HTN. HLD, type 2 diabetes, and ESRD on HD MWF who presented to the emergency department with complaints of recurrent chest pain.  Patient reports several episodes of substernal chest pain unrelieved by nitroglycerin (utilized and entire bottle prior to admission and 1 week). Cardiology evaluated patient on admission and plan was made to repeat heart catheterization.  This was performed 12/22 and revealed three-vessel occlusive CAD.  Patient was taken back for heart catheterization 12/23 for PCI and balloon angioplasty was performed to left main.  °  °Critical care consulted 12/26 due to persistent severe hypertension.  ° °Patient now being treated for NSTEMI without further revascularization options, mixed cardiogenic and distributive shock, ischemic and valvular cardiomyopathy, duodenitis versus contained gastric perforation, ESRD on HD, and others. ° °PMT was consulted for goals of care conversations. ° °Past Medical History:  °Diagnosis Date  ° Anemia   ° Anxiety   ° Asthma   ° CAD (coronary artery disease)   ° Multivessel s/p CABG 2005, numerous PCIs since that time and documented graft disease  ° Carotid artery disease (HCC)   ° R CEA  ° ESRD on hemodialysis (HCC)   ° Essential hypertension   ° Gout   ° HFmrEF (heart failure with mildly reduced EF)   ° EF ~ 40 by LV gram at cath 08/26/2021  ° History of blood transfusion   ° Hyperlipidemia   ° Hypothyroidism   ° Myocardial  infarction (HCC)   ° PAD (peripheral artery disease) (HCC)   ° Dr. Lawson  ° Pneumonia 09/2019, 11/2019  ° S/P angioplasty with stent- DES to mRCA and to LIMA to LAD with DES 04/09/18.   04/10/2018  ° SBO (small bowel obstruction) (HCC) 2011  ° Status post lysis of adhesions & hernia repair  ° Sinus bradycardia   ° Type 2 diabetes mellitus (HCC)   ° Umbilical hernia   ° ° °Subjective:  ° °This NP Eric Gill reviewed medical records, received report from team, assessed the patient and then meet at the patient's bedside to discuss diagnosis, prognosis, GOC, EOL wishes disposition and options. ° °I met with the patient at the bedside.  I also was able to speak to her son Donna Howe on the phone. °  °Concept of Palliative Care was introduced as specialized medical care for people and their families living with serious illness.  If focuses on providing relief from the symptoms and stress of a serious illness.  The goal is to improve quality of life for both the patient and the family. Values and goals of care important to patient and family were attempted to be elicited. ° °Created space and opportunity for patient  and family to explore thoughts and feelings regarding current medical situation °  °Natural trajectory and current clinical status were discussed. Questions and concerns addressed. Patient  encouraged to   of care important to patient and family were attempted to be elicited.  Created space and opportunity for patient  and family to explore thoughts and feelings regarding current medical situation   Natural trajectory and current clinical status were discussed. Questions and concerns addressed. Patient  encouraged to call with questions or concerns.    Patient/Family Understanding of Illness: Deferred  Life Review: Deferred  Patient Values: Deferred  Goals: Deferred  Today's Discussion: I met with the patient at the bedside.  Her mentation seems to be waxing and waning during the conversation.  Is not clear that she understands what she is being told.  She is frequently falling asleep during the conversation and difficult to get her to answer questions.  This is likely due to her renal status.  The nurses at the bedside in the process of starting CRRT.  They feel that she may clear up after she begins her  continuous dialysis.  I called and spoke with her son Donna Howe.  Explained palliative care and our role in the care of his mom.  He is planning to come to the hospital later this evening.  We have also set a time for a family meeting tomorrow at 1:00.  The patient did express her desire to have her brother and and involved as well, which is possible via telephone/FaceTime.  I have notified the care team of the planned family meeting and invited participation as able.  Review of Systems  Unable to perform ROS: Acuity of condition   Objective:   Primary Diagnoses: Present on Admission:  Unstable angina (HCC)  Mixed hyperlipidemia  Obesity (BMI 30-39.9)  CAD (coronary artery disease)  Diabetes mellitus with ESRD (end-stage renal disease) ([place])  ESRD (end stage renal disease) ([place])   Physical Exam Vitals and nursing note reviewed.  Constitutional:      General: She is sleeping.     Appearance: She is obese.  HENT:     Head: Normocephalic and atraumatic.  Cardiovascular:     Rate and Rhythm: Normal rate.  Pulmonary:     Effort: Pulmonary effort is normal. No respiratory distress.     Breath sounds: No wheezing or rhonchi.     Comments: Persistent productive cough Abdominal:     General: Abdomen is protuberant.     Palpations: Abdomen is soft.  Skin:    General: Skin is cool and dry.  Neurological:     Mental Status: She is lethargic.    Vital Signs:  BP (!) 68/50    Pulse 60    Temp (!) 96.7 F (35.9 C) (Axillary)    Resp 12    Ht 5' 5" (1.651 m)    Wt 100.4 kg    SpO2 91%    BMI 36.83 kg/m   Palliative Assessment/Data: 20-30%    Advanced Care Planning:   Primary Decision Maker: PATIENT with assistance from next of kin  Code Status/Advance Care Planning: Full code  Decisions/Changes to ACP: None today  Assessment & Plan:   Impression: Critically ill 65 year old female with multiple acute exacerbations of multiple chronic illnesses.  Most pressing at this time  is ESRD unable to tolerate hemodialysis due to hypotension likely from cardiogenic and distributive shock, currently on Levophed.  She has severe coronary artery disease with a history of multiple NSTEMI's and no longer has interventional treatment options available.  She is essentially an irreversible multiorgan failure.  There is a very grim prognosis and significant concern that she will not survive her  hospitalization.  SUMMARY OF RECOMMENDATIONS   Continue full code until discussions had Continue current treatments Plan for family meeting 1:00 tomorrow for [place] conversations PMT will continue to follow  Symptom Management:  Per primary team PMT is available to assist as needed  Prognosis:  Unable to determine  Discharge Planning:  To Be Determined   Discussed with: Medical team, nursing team, patient, patient family    Thank you for allowing [place] to participate in the care of Donna Howe PMT will continue to support holistically.  Time Total: 70 minutes  Greater than 50%  of this time was spent counseling and coordinating care related to the above assessment and plan.  Signed by: Walden Field, NP Palliative Medicine Team  Team Phone # [phone] (Nights/Weekends)  09/13/2021, 4:14 PM

## 2021-09-13 NOTE — Care Management (Signed)
°  Transition of Care (TOC) Screening Note   Patient Details  Name: Donna Howe Date of Birth: 1956/08/14   Transition of Care [facility]) CM/SW Contact:    Carles Collet, RN Phone Number: 09/13/2021, 4:17 PM    Transition of Care Department [facility]) has reviewed patient we will continue to monitor patient advancement through interdisciplinary progression rounds. If new patient transition needs arise, please place a TOC consult.

## 2021-09-13 NOTE — Progress Notes (Signed)
Progress Note  4 Days Post-Op  Subjective: States she is "groggy" this morning, A&Ox3. She does not remember discussion regarding her possible perforated peptic ulcer yesterday. We reviewed CT findings and plan. She denies nausea or emesis currently but states she threw up once sometime overnight or this am. She denies abdominal pain.   Objective: Vital signs in last 24 hours: Temp:  [97.6 F (36.4 C)-98.5 F (36.9 C)] 97.9 F (36.6 C) (12/27 0400) Pulse Rate:  [48-157] 60 (12/27 0700) Resp:  [8-26] 12 (12/27 0700) BP: (44-149)/(24-135) 68/50 (12/26 1445) SpO2:  [52 %-100 %] 91 % (12/27 0700) Arterial Line BP: (91-147)/(32-62) 111/49 (12/27 0700) Weight:  [100.4 kg] 100.4 kg (12/27 0500) Last BM Date: 09/08/21  Intake/Output from previous day: 12/26 0701 - 12/27 0700 In: 806.4 [I.V.:506.4; IV Piggyback:300] Out: -668  Intake/Output this shift: No intake/output data recorded.  PE: General: pleasant, WD, female who is laying in bed in NAD HEENT: head is normocephalic, atraumatic. Mouth is pink and moist Heart: regular, rate, and rhythm.  Lungs: CTAB. Respiratory effort nonlabored Abd: soft, NT, ND, +BS, no masses, hernias, or organomegaly MSK: all 4 extremities are symmetrical with no cyanosis, clubbing, or edema. Skin: warm and dry with no masses, lesions, or rashes Psych: A&Ox3 with an appropriate affect.    Lab Results:  Recent Labs    09/12/21 1456 09/13/21 0521  WBC 14.8* 14.6*  HGB 8.3* 8.4*  HCT 25.4* 26.4*  PLT 199 168   BMET Recent Labs    09/12/21 0456 09/13/21 0521  NA 132* 133*  K 5.2* 5.6*  CL 96* 97*  CO2 21* 21*  GLUCOSE 215* 217*  BUN 64* 45*  CREATININE 8.39* 6.03*  CALCIUM 9.0 8.7*   PT/INR No results for input(s): LABPROT, INR in the last 72 hours. CMP     Component Value Date/Time   NA 133 (L) 09/13/2021 0521   K 5.6 (H) 09/13/2021 0521   CL 97 (L) 09/13/2021 0521   CO2 21 (L) 09/13/2021 0521   GLUCOSE 217 (H) 09/13/2021  0521   BUN 45 (H) 09/13/2021 0521   CREATININE 6.03 (H) 09/13/2021 0521   CALCIUM 8.7 (L) 09/13/2021 0521   CALCIUM 8.7 10/27/2013 1103   PROT 6.2 (L) 09/09/2021 0251   ALBUMIN 3.1 (L) 09/09/2021 1806   AST 85 (H) 09/09/2021 0251   ALT 45 (H) 09/09/2021 0251   ALKPHOS 78 09/09/2021 0251   BILITOT 0.9 09/09/2021 0251   GFRNONAA 7 (L) 09/13/2021 0521   GFRAA 9 (L) 06/21/2020 1043   Lipase     Component Value Date/Time   LIPASE 37 08/13/2015 2030       Studies/Results: CT ABDOMEN PELVIS WO CONTRAST  Result Date: 09/12/2021 CLINICAL DATA:  Right upper quadrant pain nondiagnostic ultrasound EXAM: CT ABDOMEN AND PELVIS WITHOUT CONTRAST TECHNIQUE: Multidetector CT imaging of the abdomen and pelvis was performed following the standard protocol without IV contrast. COMPARISON:  None. FINDINGS: Lower chest: Small bilateral pleural effusions with compressive atelectasis of lower lobes. Heart is mildly enlarged. Scattered calcifications in the coronary arteries and aorta. Hepatobiliary: Prior cholecystectomy.  No focal hepatic abnormality. Pancreas: No focal abnormality or ductal dilatation. Spleen: No focal abnormality.  Normal size. Adrenals/Urinary Tract: Atrophic kidneys bilaterally. No hydronephrosis. No adrenal mass. Urinary bladder contains contrast, question source. No bladder wall abnormality. Stomach/Bowel: Normal appendix. No bowel obstruction. There is mild inflammation in the right upper quadrant adjacent to the cholecystectomy clips. This possibly is related to the distal stomach  or proximal duodenum. In addition, there are several small locules of gas adjacent to the descending colon (image 35 series 3). No diverticula seen in this area on prior study. This is concerning for extraluminal gas. Constellation of findings suggest possibility of peptic ulcer disease and perforated ulcer. Vascular/Lymphatic: Aortic atherosclerosis. No evidence of aneurysm or adenopathy. Reproductive: Uterus  and adnexa unremarkable.  No mass. Other: No free fluid or free air. Musculoskeletal: No acute bony abnormality. IMPRESSION: Inflammation in the right upper quadrant adjacent to the distal stomach, proximal duodenum and cholecystectomy clips. This was not present on prior study. In addition, concern for extraluminal locules of gas adjacent to the descending duodenum. Constellation of findings concerning for peptic ulcer disease with possible perforated ulcer. Small bilateral pleural effusions with bibasilar atelectasis. Aortic atherosclerosis. Atrophic kidneys bilaterally. Contrast is seen within the urinary bladder of unknown source (I do not see a recent contrast enhanced CT). These results were called by telephone at the time of interpretation on 09/12/2021 at 2:46 pm to provider Dr. Lynetta Mare, Who verbally acknowledged these results. Electronically Signed   By: Rolm Baptise M.D.   On: 09/12/2021 14:46   DG CHEST PORT 1 VIEW  Result Date: 09/12/2021 CLINICAL DATA:  Chest pain.  Follow-up. EXAM: PORTABLE CHEST 1 VIEW COMPARISON:  09/08/2021 FINDINGS: Previous median sternotomy. Right internal jugular central line with tips at the SVC RA junction or proximal right atrium. Worsened congestive heart failure with worsened pulmonary edema. No visible pleural effusion. IMPRESSION: Worsened diffuse pulmonary edema. Electronically Signed   By: Nelson Chimes M.D.   On: 09/12/2021 14:57   ECHOCARDIOGRAM LIMITED  Result Date: 09/12/2021    ECHOCARDIOGRAM LIMITED REPORT   Patient Name:   CLARIE CAMEY Date of Exam: 09/12/2021 Medical Rec #:  [id number]        Height:       65.0 in Accession #:    [id number]       Weight:       216.9 lb Date of Birth:  12/26/1955       BSA:          2.048 m Patient Age:    65 years         BP:           95/25 mmHg Patient Gender: F                HR:           651 bpm. Exam Location:  Inpatient Procedure: Limited Echo, Color Doppler and Cardiac Doppler Indications:    Chest Pain  R07.9  History:        Patient has prior history of Echocardiogram examinations, most                 recent 06/15/2021. CHF, CAD and Previous Myocardial Infarction,                 PAD, Arrythmias:Bradycardia; Risk Factors:Hypertension,                 Dyslipidemia and Diabetes. End stage renal disease.  Sonographer:    Darlina Sicilian RDCS Referring Phys: [id number] PAULA V ROSS IMPRESSIONS  1. Left ventricular ejection fraction, by estimation, is 35 to 40%. The left ventricle has moderately decreased function. The left ventricle demonstrates global hypokinesis. The left ventricular internal cavity size was mildly dilated. There is mild concentric left ventricular hypertrophy. Left ventricular diastolic parameters are consistent with Grade II diastolic dysfunction (pseudonormalization). Elevated left atrial  pressure. There is global left ventricular hypokinesis with severe lateral wall hypokinesis.  2. Right ventricular systolic function is normal. The right ventricular size is normal. Tricuspid regurgitation signal is inadequate for assessing PA pressure.  3. Left atrial size was mildly dilated.  4. Severe mitral valve regurgitation.  5. The aortic valve is tricuspid. There is mild calcification of the aortic valve. Aortic valve regurgitation is not visualized. Aortic valve sclerosis is present, with no evidence of aortic valve stenosis. Comparison(s): Prior images reviewed side by side. The left ventricular function is significantly worse. The left ventricular wall motion abnormality is new. There is new severe mitral insufficiency due to ischemic posterior leaflet tethering. FINDINGS  Left Ventricle: Left ventricular ejection fraction, by estimation, is 35 to 40%. The left ventricle has moderately decreased function. The left ventricle demonstrates global hypokinesis. The left ventricular internal cavity size was mildly dilated. There is mild concentric left ventricular hypertrophy. Left ventricular diastolic  parameters are consistent with Grade II diastolic dysfunction (pseudonormalization). Elevated left atrial pressure.  LV Wall Scoring: The antero-lateral wall and posterior wall are hypokinetic. There is global left ventricular hypokinesis with severe lateral wall hypokinesis. Right Ventricle: The right ventricular size is normal. No increase in right ventricular wall thickness. Right ventricular systolic function is normal. Tricuspid regurgitation signal is inadequate for assessing PA pressure. Left Atrium: Left atrial size was mildly dilated. Right Atrium: Right atrial size was normal in size. Pericardium: There is no evidence of pericardial effusion. Mitral Valve: Mild to moderate mitral annular calcification. Severe mitral valve regurgitation, with eccentric laterally directed jet. Tricuspid Valve: The tricuspid valve is normal in structure. Tricuspid valve regurgitation is trivial. Aortic Valve: The aortic valve is tricuspid. There is mild calcification of the aortic valve. Aortic valve regurgitation is not visualized. Aortic valve sclerosis is present, with no evidence of aortic valve stenosis. Pulmonic Valve: The pulmonic valve was not well visualized. Pulmonic valve regurgitation is not visualized. Aorta: The aortic root is normal in size and structure. IAS/Shunts: No atrial level shunt detected by color flow Doppler. LEFT VENTRICLE PLAX 2D LVIDd:         5.68 cm LVIDs:         5.04 cm LV PW:         1.24 cm LV IVS:        1.30 cm LVOT diam:     1.95 cm LV SV:         58 LV SV Index:   28 LVOT Area:     2.98 cm  AORTIC VALVE LVOT Vmax:   120.62 cm/s LVOT Vmean:  71.701 cm/s LVOT VTI:    0.194 m MITRAL VALVE MV Area (plan): 2.62 cm  SHUNTS                           Systemic VTI:  0.19 m                           Systemic Diam: 1.95 cm Dani Gobble Croitoru MD Electronically signed by Sanda Klein MD Signature Date/Time: 09/12/2021/2:39:01 PM    Final     Anti-infectives: Anti-infectives (From admission,  onward)    Start     Dose/Rate Route Frequency Ordered Stop   09/12/21 1715  cefTRIAXone (ROCEPHIN) 2 g in sodium chloride 0.9 % 100 mL IVPB        2 g 200 mL/hr over 30 Minutes Intravenous Every 24  hours 09/12/21 1619     09/12/21 1715  metroNIDAZOLE (FLAGYL) IVPB 500 mg        500 mg 100 mL/hr over 60 Minutes Intravenous Every 12 hours 09/12/21 1619          Assessment/Plan Possible contained perforated PUD  - CT 12/26 with inflammation RUQ, extraluminal gas adjacent to descending duodenum - no free fluid/air - no abdominal pain. Recent emesis - continue bowel rest, IV abx, bid PPI  FEN: NPO VTE: heparin subq, plavix ID: ceftriaxone/flagyl  ESRD HTB HLD DM CAD Mitral regurg    LOS: 5 days    Winferd Humphrey, [facility] 09/13/2021, 7:57 AM Please see Amion for pager number during day hours 7:00am-4:30pm

## 2021-09-13 NOTE — Progress Notes (Signed)
[facility] Progress Note    Assessment/ Plan:   # ESRD: w/ hyperkalemia - had HD yest, shortened somewhat - will start CRRT today, hypotensive w/ pulm edema on pressors support and K+ uncontrolled  # Hypotension - unsure exact etiology, cardiogenic vs other   # Chest Pain/unstable angina, recent NSTEMI -per cardiology. S/p card cath 12/22. On nitro gtt -s/p 12/23 PCI to left main into Lcx w/ balloon angio and shockwave therapy, first OM not salvageable (severe disease) - LVEF 35-40% by echo yesterday   # Volume - up 7kg today w/ diffuse pulm edema by repeat CXR - remove volume w/ CRRT today and pressor support   # Anemia of Chronic Kidney Disease: Hemoglobin 8.0. on esa   # Secondary Hyperparathyroidism/Hyperphosphatemia: monitor phos. renal diet, will start binders if needed   # Vascular access: RIJ TDC, due for 2nd stage R AVF surgery   Kelly Splinter, MD 09/13/2021, 11:34 AM   OP HD: TTS [facility]KC  4h 31min  93.5kg   2/2.5 bath  400/500   Hep none  RIJ TDC  - mircera 30 q4  - venofer 50 weekly  - calcitriol 0.5 ug tiw po      Subjective:   Seen in ICU, on levo gtt, a-line BP's higher in 120's today. Pt more alert   Objective:   BP (!) 68/50    Pulse 60    Temp (!) 97.1 F (36.2 C) (Oral)    Resp 12    Ht 5\' 5"  (1.651 m)    Wt 100.4 kg    SpO2 91%    BMI 36.83 kg/m   Intake/Output Summary (Last 24 hours) at 09/13/2021 1134 Last data filed at 09/13/2021 0700 Gross per 24 hour  Intake 806.4 ml  Output --  Net 806.4 ml    Weight change: 3.194 kg  Physical Exam: Gen:NAD, no ^ wob, calm, more alert today CVS:s1s2 no RG Resp:cta bl, normal wob JQZ:ESPQ Ext: 1-2+ bilat UE/ LE edema Neuro: awake, alert Dialysis access: RUE AVF, RIJ TDC (active access)    BMET Recent Labs  Lab 09/08/21 1058 09/09/21 0251 09/09/21 1806 09/10/21 0119 09/11/21 0327 09/12/21 0456 09/13/21 0521  NA 134* 134* 131* 131* 129* 132* 133*  K 4.2 3.9 4.1 4.8 5.1  5.2* 5.6*  CL 97* 96* 95* 94* 93* 96* 97*  CO2 19* 27 25 25 23  21* 21*  GLUCOSE 246* 167* 244* 225* 190* 215* 217*  BUN 40* 20 29* 34* 49* 64* 45*  CREATININE 6.29* 4.31* 5.42* 5.93* 7.45* 8.39* 6.03*  CALCIUM 9.7 8.6* 8.5* 9.1 8.9 9.0 8.7*  PHOS  --   --  5.9*  --   --   --   --     CBC Recent Labs  Lab 09/11/21 0327 09/12/21 0456 09/12/21 1456 09/13/21 0521  WBC 10.2 11.0* 14.8* 14.6*  HGB 8.0* 7.9* 8.3* 8.4*  HCT 24.4* 24.8* 25.4* 26.4*  MCV 107.5* 106.9* 108.5* 108.6*  PLT 149* 153 199 168     Medications:     allopurinol  100 mg Oral Q T,Th,Sa-HD   aspirin EC  81 mg Oral Daily   atorvastatin  80 mg Oral QHS   Chlorhexidine Gluconate Cloth  6 each Topical Daily   Chlorhexidine Gluconate Cloth  6 each Topical Q0600   clopidogrel  75 mg Oral Daily   darbepoetin (ARANESP) injection - DIALYSIS  40 mcg Intravenous Q Thu-HD   ezetimibe  10 mg Oral QHS   ferric  citrate  420 mg Oral TID WC   fluticasone furoate-vilanterol  1 puff Inhalation Daily   gabapentin  400 mg Oral QHS   heparin  5,000 Units Subcutaneous Q8H   insulin aspart  0-15 Units Subcutaneous TID WC   insulin aspart  0-5 Units Subcutaneous QHS   insulin detemir  10 Units Subcutaneous BID   isosorbide mononitrate  15 mg Oral Daily   levothyroxine  125 mcg Oral QAC breakfast   midodrine  10 mg Oral 3 times per day   pantoprazole (PROTONIX) IV  40 mg Intravenous Q12H   pantoprazole (PROTONIX) IV  40 mg Intravenous Q12H   ranolazine  500 mg Oral QHS   sodium zirconium cyclosilicate  10 g Oral TID

## 2021-09-13 NOTE — Progress Notes (Signed)
eLink Physician-Brief Progress Note Patient Name: Donna Howe DOB: [birth year] MRN: [id number]   Date of Service  09/13/2021  HPI/Events of Note  Hypoglycemia - Patient on Levemir + Q 4 hour Novolog SSI. Blood glucose = 96. NPO no TFs.  eICU Interventions  Plan: D10W to run IV at 40 mL/hour.     Intervention Category Major Interventions: Other:  Lysle Dingwall 09/13/2021, 9:34 PM

## 2021-09-13 NOTE — Progress Notes (Signed)
NAME:  Donna Howe, MRN:  [id number], DOB:  10-04-1955, LOS: 5 ADMISSION DATE:  09/08/2021, CONSULTATION DATE:  09/12/2021 REFERRING MD:  Dr. Harrington Challenger, CHIEF COMPLAINT: Hypotension with hypoxia  History of Present Illness:  Donna Howe Alles 65 year old female with an extensive past medical history status post CABG in 2005 with multiple PCI's since, multiple NSTEMI's and has been deemed too high risk for CABG re-do. HTN. HLD, type 2 diabetes, and ESRD on HD MWF who presented to the emergency department with complaints of recurrent chest pain.  Patient reports several episodes of substernal chest pain unrelieved by nitroglycerin (utilized and entire bottle prior to admission and 1 week)  Cardiology evaluated patient on admission and plan was made to repeat heart catheterization.  This was performed 12/22 and revealed three-vessel occlusive CAD.  Patient was taken back for heart catheterization 12/23 for PCI and balloon angioplasty was performed to left main.   Critical care consulted 12/26 due to persistent severe hypertension  Pertinent  Medical History  Prior CABG in 2005 with multiple PCI's since, multiple NSTEMI's and has been deemed too high risk for CABG re-do. HTN. HLD, type 2 diabetes, and ESRD on HD MWF  Significant Hospital Events: Including procedures, antibiotic start and stop dates in addition to other pertinent events   12/22 admitted with complaints of chest pain underwent diagnostic left heart cath which revealed severe three-vessel occlusive disease 12/23 underwent balloon angioplasty to left main 12/26 developed significant hypotension resulting in transfer ICU for further management  Interim History / Subjective:  More lethargic today, starting pulling off [place].  Objective   Blood pressure (!) 68/50, pulse 60, temperature (!) 97.1 F (36.2 C), temperature source Oral, resp. rate 12, height 5\' 5"  (1.651 m), weight 100.4 kg, SpO2 91 %.        Intake/Output Summary (Last  24 hours) at 09/13/2021 0947 Last data filed at 09/13/2021 0700 Gross per 24 hour  Intake 806.4 ml  Output -668 ml  Net 1474.4 ml    Filed Weights   09/11/21 0807 09/12/21 0534 09/13/21 0500  Weight: 97.2 kg 98.4 kg 100.4 kg    Examination: Ill appearing woman lying in bed Abdominal exam benign Ext lukewarm Lethargic but answers questions appropriately Lungs clear Moves all 4 ext to command Diffuse anasarca Jaundice?  Resolved Hospital Problem list     Assessment & Plan:  NSTEMI without further revascularization options Mixed cardiogenic and distributive shock Ischemic and valvular cardiomyopathy Question duodenitis vs. Contained perforation- conservative management, not operative candidate ESRD on HD DM2 with hyperglycemia  - Start CRRT, discussed with nephrology - Levophed titrated to MAP 65 - NPO (will allow sips with meds), abx per surgery - Palliative has been consulted by primary, discussed with Dr. Johney Frame, PCCM will take over as primary as this is now turning into irreversible multiorgan failure - Cautious with sedating meds - Grim prognosis  Best Practice (right click and "Reselect all SmartList Selections" daily)   Diet/type: NPO DVT prophylaxis: prophylactic heparin  GI prophylaxis: PPI Lines: N/A Foley:  N/A Code Status:  full code Last date of multidisciplinary goals of care discussion: Pending, appreciate palliative help   Patient critically ill due to shock Interventions to address this today pressor titration Risk of deterioration without these interventions is high  I personally spent 33 minutes providing critical care not including any separately billable procedures  Erskine Emery MD [facility]  Prefer epic messenger for cross cover needs If after hours, please call E-link

## 2021-09-13 NOTE — Plan of Care (Signed)

## 2021-09-14 DIAGNOSIS — Z7189 Other specified counseling: Secondary | ICD-10-CM | POA: Diagnosis not present

## 2021-09-14 DIAGNOSIS — E782 Mixed hyperlipidemia: Secondary | ICD-10-CM | POA: Diagnosis not present

## 2021-09-14 DIAGNOSIS — Z951 Presence of aortocoronary bypass graft: Secondary | ICD-10-CM

## 2021-09-14 DIAGNOSIS — I25110 Atherosclerotic heart disease of native coronary artery with unstable angina pectoris: Secondary | ICD-10-CM | POA: Diagnosis not present

## 2021-09-14 DIAGNOSIS — Z515 Encounter for palliative care: Secondary | ICD-10-CM | POA: Diagnosis not present

## 2021-09-14 DIAGNOSIS — R579 Shock, unspecified: Secondary | ICD-10-CM | POA: Diagnosis not present

## 2021-09-14 DIAGNOSIS — I200 Unstable angina: Secondary | ICD-10-CM | POA: Diagnosis not present

## 2021-09-14 DIAGNOSIS — R079 Chest pain, unspecified: Secondary | ICD-10-CM | POA: Diagnosis not present

## 2021-09-14 LAB — RENAL FUNCTION PANEL
Albumin: 3 g/dL — ABNORMAL LOW (ref 3.5–5.0)
Albumin: 3 g/dL — ABNORMAL LOW (ref 3.5–5.0)
Anion gap: 11 (ref 5–15)
Anion gap: 10 (ref 5–15)
BUN: 29 mg/dL — ABNORMAL HIGH (ref 8–23)
BUN: 19 mg/dL (ref 8–23)
CO2: 24 mmol/L (ref 22–32)
CO2: 24 mmol/L (ref 22–32)
Calcium: 8.5 mg/dL — ABNORMAL LOW (ref 8.9–10.3)
Calcium: 8.8 mg/dL — ABNORMAL LOW (ref 8.9–10.3)
Chloride: 100 mmol/L (ref 98–111)
Chloride: 101 mmol/L (ref 98–111)
Creatinine, Ser: 3.43 mg/dL — ABNORMAL HIGH (ref 0.44–1.00)
Creatinine, Ser: 2.36 mg/dL — ABNORMAL HIGH (ref 0.44–1.00)
GFR, Estimated: 14 mL/min — ABNORMAL LOW (ref >60)
GFR, Estimated: 23 mL/min — ABNORMAL LOW (ref >60)
Glucose, Bld: 134 mg/dL — ABNORMAL HIGH (ref 70–99)
Glucose, Bld: 152 mg/dL — ABNORMAL HIGH (ref 70–99)
Phosphorus: 3.4 mg/dL (ref 2.5–4.6)
Phosphorus: 2.2 mg/dL — ABNORMAL LOW (ref 2.5–4.6)
Potassium: 4.6 mmol/L (ref 3.5–5.1)
Potassium: 4.3 mmol/L (ref 3.5–5.1)
Sodium: 135 mmol/L (ref 135–145)
Sodium: 135 mmol/L (ref 135–145)

## 2021-09-14 LAB — ALKALINE PHOSPHATASE: Alkaline Phosphatase: 199 U/L — ABNORMAL HIGH (ref 38–126)

## 2021-09-14 LAB — GLUCOSE, CAPILLARY
Glucose-Capillary: 127 mg/dL — ABNORMAL HIGH (ref 70–99)
Glucose-Capillary: 133 mg/dL — ABNORMAL HIGH (ref 70–99)
Glucose-Capillary: 129 mg/dL — ABNORMAL HIGH (ref 70–99)
Glucose-Capillary: 145 mg/dL — ABNORMAL HIGH (ref 70–99)
Glucose-Capillary: 139 mg/dL — ABNORMAL HIGH (ref 70–99)

## 2021-09-14 LAB — PROTEIN, TOTAL: Total Protein: 5.7 g/dL — ABNORMAL LOW (ref 6.5–8.1)

## 2021-09-14 LAB — BILIRUBIN, TOTAL: Total Bilirubin: 1.1 mg/dL (ref 0.3–1.2)

## 2021-09-14 LAB — AST: AST: 322 U/L — ABNORMAL HIGH (ref 15–41)

## 2021-09-14 LAB — BILIRUBIN, DIRECT: Bilirubin, Direct: 0.4 mg/dL — ABNORMAL HIGH (ref 0.0–0.2)

## 2021-09-14 LAB — MAGNESIUM: Magnesium: 2.4 mg/dL (ref 1.7–2.4)

## 2021-09-14 LAB — ALT: ALT: 203 U/L — ABNORMAL HIGH (ref 0–44)

## 2021-09-14 LAB — APTT: aPTT: 66 seconds — ABNORMAL HIGH (ref 24–36)

## 2021-09-14 MED ORDER — LEVOTHYROXINE SODIUM 100 MCG/5ML IV SOLN
62.5000 ug | Freq: Every day | INTRAVENOUS | Status: DC
  Administered 2021-09-14 – 2021-09-15 (×2): 62.5 ug via INTRAVENOUS
  Filled 2021-09-14 (×3): qty 5

## 2021-09-14 MED ORDER — SODIUM CHLORIDE 0.9% FLUSH: 10.0000 mL | INTRAVENOUS | Status: DC | PRN

## 2021-09-14 MED ORDER — MORPHINE SULFATE (PF) 2 MG/ML IV SOLN: 2.0000 mg | INTRAVENOUS | Status: DC | PRN

## 2021-09-14 MED ORDER — LEVOTHYROXINE SODIUM 100 MCG/5ML IV SOLN: 62.5000 ug | Freq: Every day | INTRAVENOUS | Status: DC

## 2021-09-14 MED ORDER — LORAZEPAM 2 MG/ML IJ SOLN: 0.5000 mg | INTRAMUSCULAR | Status: DC | PRN

## 2021-09-14 MED ORDER — SODIUM CHLORIDE 0.9% FLUSH
10.0000 mL | Freq: Two times a day (BID) | INTRAVENOUS | Status: DC
Start: 2021-09-14 — End: 2021-09-17
  Administered 2021-09-14 – 2021-09-17 (×6): 10 mL

## 2021-09-14 MED ORDER — ASPIRIN 300 MG RE SUPP
150.0000 mg | Freq: Every day | RECTAL | Status: DC
  Administered 2021-09-15: 11:00:00 150 mg via RECTAL
  Filled 2021-09-14 (×2): qty 1

## 2021-09-14 NOTE — Progress Notes (Signed)
Daily Progress Note   Patient Name: Donna Howe       Date: 09/14/2021 DOB: 06/09/1956  Age: 65 y.o. MRN#: [id number] Attending Physician: Candee Furbish, MD Primary Care Physician: Curlene Labrum, MD Admit Date: 09/08/2021 Length of Stay: 6 days  Reason for Consultation/Follow-up: Establishing goals of care  HPI/Patient Profile:  65 y.o. female  with past medical history of  status post CABG in 2005 with multiple PCI's since, multiple NSTEMI's and has been deemed too high risk for CABG re-do. HTN. HLD, type 2 diabetes, and ESRD on HD MWF who presented to the emergency department with complaints of recurrent chest pain.  Patient reports several episodes of substernal chest pain unrelieved by nitroglycerin (utilized and entire bottle prior to admission and 1 week). Cardiology evaluated patient on admission and plan was made to repeat heart catheterization.  This was performed 12/22 and revealed three-vessel occlusive CAD.  Patient was taken back for heart catheterization 12/23 for PCI and balloon angioplasty was performed to left main.    Critical care consulted 12/26 due to persistent severe hypertension.    Patient now being treated for NSTEMI without further revascularization options, mixed cardiogenic and distributive shock, ischemic and valvular cardiomyopathy, duodenitis versus contained gastric perforation, ESRD on HD, and others.   PMT was consulted for goals of care conversations.  Subjective:   Subjective: Chart Reviewed. Updates received. Patient Assessed. Created space and opportunity for patient  and family to explore thoughts and feelings regarding current medical situation.  Today's Discussion: I met with the patient, her son, and her daughter-in-law at the bedside.  We allowed time for visiting.  The patient is sleepy/somnolent, intermittently interactive.  She agreed for [place] to discuss her care with her son and then come back to let her know how we are proceeding.  We  decided to convene in the conference room for further discussions, also joined by Dr Erskine Emery of PCCM and the patient's brother Donna Howe (lives in [place]) via [place].  We had an extensive discussion about the patient's current medical status.  In summary the patient has had ESRD on HD for about a year.  She has had multiple heart attacks in the past.  She is recently had a heart attack that is caused an ischemic mitral valve issue that is irreparable.  She is now in cardiogenic shock, requiring pressors intermittently, severe CAD that it has no treatment options, requiring CRRT (not tolerating intermittent hemodialysis due to shock).  Overall it was explained that this is a irreversible multiorgan failure without further options or destinations for therapy.  Essentially, she is terminal and not likely to survive.  In addition, she is having GI issues including possible contained perforation of the stomach versus colitis (which may be due to heart failure/ischemia) that is making her uncomfortable with persistent nausea and vomiting.  She is unable to eat, keep down food.  We discussed the patient's wishes and what would be her goals for her care given that she is not able to communicate these.  The patient's son and daughter-in-law were expectedly tearful about learning this.  They discussed that she has grandchildren that would like to visit her, one of which is 63 years old.  We had a code discussion given the patient's severe clinical state and we recommended DNR.  They have decided to make her a DNR.  We discussed options such as continued aggressive care through comfort care and all points in between.  After back-and-forth, loss of  discussion, and considering the patient's wishes and desire to see her grandchildren and her brother (who lives in [place] and will try to get a flight as soon as possible) we have decided on no escalation of care, continue current treatments, if she significantly  decompensates then we will shift to comfort focus.  After her brother and family is able to visit her, we will plan a shift to comfort.  Everybody is in agreement with this.  Afterward I explained the summary to the patient's current nurse.  We also discussed with the unit director to allow special considerations for the patient's 21-year-old granddaughter to be able to visit briefly.  I provided emotional general support through therapeutic listening, empathy, sharing of stories.  I answered all questions and addressed all concerns to the best of my ability.  Review of Systems  Unable to perform ROS: Acuity of condition   Objective:   Vital Signs:  BP (!) 68/50    Pulse 65    Temp 97.8 F (36.6 C) (Axillary)    Resp (!) 9    Ht _0  (1.651 m)    Wt 98.5 kg    SpO2 100%    BMI 36.14 kg/m   Physical Exam: Physical Exam Vitals and nursing note reviewed.  Constitutional:      General: She is sleeping. She is not in acute distress.    Appearance: She is ill-appearing.  HENT:     Head: Normocephalic and atraumatic.  Cardiovascular:     Rate and Rhythm: Normal rate.  Pulmonary:     Effort: Pulmonary effort is normal. No respiratory distress.     Breath sounds: No wheezing or rhonchi.  Abdominal:     General: Abdomen is flat.     Palpations: Abdomen is soft.  Skin:    General: Skin is warm and dry.  Neurological:     Mental Status: She is lethargic.    Palliative Assessment/Data: 10-20%   Assessment & Plan:   Impression: Present on Admission:  Unstable angina (HCC)  Mixed hyperlipidemia  Obesity (BMI 30-39.9)  CAD (coronary artery disease)  Diabetes mellitus with ESRD (end-stage renal disease) ([place])  ESRD (end stage renal disease) ([place])  Critically ill 65 year old female with multiple acute exacerbations of multiple chronic illnesses.  Most pressing at this time is ESRD unable to tolerate hemodialysis due to hypotension likely from cardiogenic and distributive shock,  currently on Levophed.  She has severe coronary artery disease with a history of multiple NSTEMI's and no longer has interventional treatment options available.  She is essentially an irreversible multiorgan failure.  There is a very grim prognosis and significant concern that she will not survive her hospitalization.  After discussion today we have elected DNR, do not escalate care, continue current treatments until her brother is able to visit from [place].  In the event that she decompensates we will switch to comfort care.  Regardless, when her brother is able to visit her we will also shift to comfort care.  SUMMARY OF RECOMMENDATIONS   Changed to DNR Continue current treatments Do not escalate care Allow time for brother to visit from [place] In the event of decompensation, switch to comfort focus After brother is able to visit from [place], switch to comfort focus PMT will continue to follow  Code Status: DNR  Prognosis: Hours - Days  Discharge Planning: Anticipated Hospital Death  Discussed with: Medical team, nursing team, patient, patient's family  Thank you for allowing [place] to participate in the  care of Briseyda Fehr Hufstedler PMT will continue to support holistically.  Time Total: 75 min  Visit consisted of counseling and education dealing with the complex and emotionally intense issues of symptom management and palliative care in the setting of serious and potentially life-threatening illness. Greater than 50%  of this time was spent counseling and coordinating care related to the above assessment and plan.  Walden Field, NP Palliative Medicine Team  Team Phone # [phone] (Nights/Weekends)  05/17/2021, 8:17 AM

## 2021-09-14 NOTE — Plan of Care (Signed)

## 2021-09-14 NOTE — Progress Notes (Signed)
NAME:  Donna Howe, MRN:  [id number], DOB:  May 17, 1956, LOS: 6 ADMISSION DATE:  09/08/2021, CONSULTATION DATE:  09/12/2021 REFERRING MD:  Dr. Harrington Challenger, CHIEF COMPLAINT: Hypotension with hypoxia  History of Present Illness:  Donna Howe 65 year old female with an extensive past medical history status post CABG in 2005 with multiple PCI's since, multiple NSTEMI's and has been deemed too high risk for CABG re-do. HTN. HLD, type 2 diabetes, and ESRD on HD MWF who presented to the emergency department with complaints of recurrent chest pain.  Patient reports several episodes of substernal chest pain unrelieved by nitroglycerin (utilized and entire bottle prior to admission and 1 week)  Cardiology evaluated patient on admission and plan was made to repeat heart catheterization.  This was performed 12/22 and revealed three-vessel occlusive CAD.  Patient was taken back for heart catheterization 12/23 for PCI and balloon angioplasty was performed to left main.   Critical care consulted 12/26 due to persistent severe hypertension  Pertinent  Medical History  Prior CABG in 2005 with multiple PCI's since, multiple NSTEMI's and has been deemed too high risk for CABG re-do. HTN. HLD, type 2 diabetes, and ESRD on HD MWF  Significant Hospital Events: Including procedures, antibiotic start and stop dates in addition to other pertinent events   12/22 admitted with complaints of chest pain underwent diagnostic left heart cath which revealed severe three-vessel occlusive disease 12/23 underwent balloon angioplasty to left main 12/26 developed significant hypotension resulting in transfer ICU for further management  Interim History / Subjective:  More lethargic today, starting pulling off [place].  Objective   Blood pressure (!) 68/50, pulse 65, temperature 98.4 F (36.9 C), temperature source Axillary, resp. rate (!) 9, height 5\' 5"  (1.651 m), weight 98.5 kg, SpO2 100 %.        Intake/Output Summary  (Last 24 hours) at 09/14/2021 0848 Last data filed at 09/14/2021 0800 Gross per 24 hour  Intake 1078.57 ml  Output 2696 ml  Net -1617.43 ml    Filed Weights   09/12/21 0534 09/13/21 0500 09/14/21 0500  Weight: 98.4 kg 100.4 kg 98.5 kg    Examination: Ill appearing woman lying in bed Abdominal exam remains benign Ext lukewarm AO x 3 with repeated prompting, this varies through day c/w delirium Lungs clear but shallow efforts Moves all 4 ext to command Diffuse anasarca  BMP improved with CRRT LFTs worse  Resolved Hospital Problem list     Assessment & Plan:  NSTEMI without further revascularization options Mixed cardiogenic and distributive shock Ischemic mitral failure and cardiomyopathy Congestive hepatopathy from worsening heart failure Question duodenitis vs. Contained perforation- conservative management, not operative candidate ESRD on CRRT DM2 with hypoglycemia  - Continue CRRT for now - Levophed titrated to MAP 65 - Bowel rest per surgery - I do not think there are any further options or destinations for therapy, her heart failure is terminal even without GI issues.  Palliative meeting planned for today, will attend if schedule allows.  Best Practice (right click and "Reselect all SmartList Selections" daily)   Diet/type: NPO DVT prophylaxis: prophylactic heparin  GI prophylaxis: PPI Lines: N/A Foley:  N/A Code Status:  full code Last date of multidisciplinary goals of care discussion: pending today   Patient critically ill due to shock Interventions to address this today pressor titration Risk of deterioration without these interventions is high  I personally spent 34 minutes providing critical care not including any separately billable procedures  Erskine Emery MD [facility]  Prefer epic messenger for cross cover needs If after hours, please call E-link

## 2021-09-14 NOTE — Progress Notes (Signed)
Progress Note  Patient Name: Donna Howe Date of Encounter: 09/14/2021  [facility] Cardiologist: Rozann Lesches, MD   Subjective   Somnolent this morning but arousable to voice. Complains of nausea. No chest pain. On CRRT and levo 2.  Seen by palliative care with plans for family meeting later today   Inpatient Medications    Scheduled Meds:  allopurinol  100 mg Oral Q T,Th,Sa-HD   aspirin  150 mg Rectal Daily   atorvastatin  80 mg Oral QHS   Chlorhexidine Gluconate Cloth  6 each Topical Daily   Chlorhexidine Gluconate Cloth  6 each Topical Q0600   clopidogrel  75 mg Oral Daily   darbepoetin (ARANESP) injection - DIALYSIS  40 mcg Intravenous Q Thu-HD   ezetimibe  10 mg Oral QHS   ferric citrate  420 mg Oral TID WC   fluticasone furoate-vilanterol  1 puff Inhalation Daily   gabapentin  400 mg Oral QHS   heparin  5,000 Units Subcutaneous Q8H   insulin aspart  0-15 Units Subcutaneous TID WC   insulin aspart  0-5 Units Subcutaneous QHS   insulin detemir  10 Units Subcutaneous BID   isosorbide mononitrate  15 mg Oral Daily   [START ON 09/17/2021] levothyroxine  62.5 mcg Intravenous Daily   midodrine  10 mg Oral 3 times per day   pantoprazole (PROTONIX) IV  40 mg Intravenous Q12H   pantoprazole (PROTONIX) IV  40 mg Intravenous Q12H   ranolazine  500 mg Oral QHS   Continuous Infusions:   prismasol BGK 4/2.5 400 mL/hr at 09/14/21 0329    prismasol BGK 4/2.5 200 mL/hr at 09/13/21 1500   sodium chloride 10 mL/hr at 09/14/21 0100   cefTRIAXone (ROCEPHIN)  IV Stopped (09/13/21 1754)   dextrose 40 mL/hr at 09/14/21 0700   heparin 10,000 units/ 20 mL infusion syringe 500 Units/hr (09/13/21 2000)   metronidazole Stopped (09/14/21 0655)   norepinephrine (LEVOPHED) Adult infusion 2 mcg/min (09/14/21 0700)   prismasol BGK 4/2.5 1,750 mL/hr at 09/14/21 0800   promethazine (PHENERGAN) injection (IM or IVPB) Stopped (09/14/21 0221)   PRN Meds: acetaminophen, albuterol,  alteplase, heparin, HYDROmorphone (DILAUDID) injection, ipratropium, nitroGLYCERIN, ondansetron (ZOFRAN) IV, ondansetron, promethazine (PHENERGAN) injection (IM or IVPB), sodium chloride   Vital Signs    Vitals:   09/14/21 0615 09/14/21 0630 09/14/21 0645 09/14/21 0700  BP:      Pulse: 66 66 66 65  Resp: 10 11 (!) 9 (!) 9  Temp:    98.4 F (36.9 C)  TempSrc:    Axillary  SpO2: 100% 100% 100% 100%  Weight:      Height:        Intake/Output Summary (Last 24 hours) at 09/14/2021 0833 Last data filed at 09/14/2021 0700 Gross per 24 hour  Intake 1031.14 ml  Output 2696 ml  Net -1664.86 ml    Last 3 Weights 09/14/2021 09/13/2021 09/12/2021  Weight (lbs) 217 lb 2.5 oz 221 lb 5.5 oz 216 lb 14.9 oz  Weight (kg) 98.5 kg 100.4 kg 98.4 kg      Telemetry    NSR, occasional PVCs - Personally Reviewed  ECG    No new tracing - Personally Reviewed  Physical Exam   GEN: Chronically ill appearing, somnolent Neck: Supple Cardiac: RR, 2/6 systolic murmur Respiratory: Diminished with crackles at bases GI: Obese, soft MS: No edema; No deformity. Neuro:  Somnolent but arousable to voice. Grossly nonfocal Psych: Normal affect   Labs    High Sensitivity  Troponin:   Recent Labs  Lab 09/08/21 1034 09/08/21 1210 09/09/21 0251 09/09/21 0631 09/09/21 2019  TROPONINIHS 134* 173* 1,641* 2,762* 7,276*      Chemistry Recent Labs  Lab 09/09/21 0251 09/09/21 1806 09/13/21 0521 09/13/21 1114 09/13/21 1704 09/14/21 0433  NA 134*   < > 133*  --  134* 135  K 3.9   < > 5.6*  --  5.1 4.6  CL 96*   < > 97*  --  98 100  CO2 27   < > 21*  --  18* 24  GLUCOSE 167*   < > 217*  --  99 134*  BUN 20   < > 45*  --  45* 29*  CREATININE 4.31*   < > 6.03*  --  5.50* 3.43*  CALCIUM 8.6*   < > 8.7*  --  8.9 8.5*  MG  --   --   --   --   --  2.4  PROT 6.2*  --   --  5.7*  --  5.7*  ALBUMIN 3.6   < >  --  2.9* 2.9* 3.0*  AST 85*  --   --  224*  --  322*  ALT 45*  --   --  156*  --  203*   ALKPHOS 78  --   --  214*  --  199*  BILITOT 0.9  --   --  1.3*  --  1.1  GFRNONAA 11*   < > 7*  --  8* 14*  ANIONGAP 11   < > 15  --  18* 11   < > = values in this interval not displayed.     Lipids No results for input(s): CHOL, TRIG, HDL, LABVLDL, LDLCALC, CHOLHDL in the last 168 hours.  Hematology Recent Labs  Lab 09/12/21 0456 09/12/21 1456 09/13/21 0521  WBC 11.0* 14.8* 14.6*  RBC 2.32* 2.34* 2.43*  HGB 7.9* 8.3* 8.4*  HCT 24.8* 25.4* 26.4*  MCV 106.9* 108.5* 108.6*  MCH 34.1* 35.5* 34.6*  MCHC 31.9 32.7 31.8  RDW 14.6 14.9 15.1  PLT 153 199 168    Thyroid No results for input(s): TSH, FREET4 in the last 168 hours.  BNP Recent Labs  Lab 09/09/21 0251  BNP 3,297.0*     DDimer No results for input(s): DDIMER in the last 168 hours.   Radiology    CT ABDOMEN PELVIS WO CONTRAST  Result Date: 09/12/2021 CLINICAL DATA:  Right upper quadrant pain nondiagnostic ultrasound EXAM: CT ABDOMEN AND PELVIS WITHOUT CONTRAST TECHNIQUE: Multidetector CT imaging of the abdomen and pelvis was performed following the standard protocol without IV contrast. COMPARISON:  None. FINDINGS: Lower chest: Small bilateral pleural effusions with compressive atelectasis of lower lobes. Heart is mildly enlarged. Scattered calcifications in the coronary arteries and aorta. Hepatobiliary: Prior cholecystectomy.  No focal hepatic abnormality. Pancreas: No focal abnormality or ductal dilatation. Spleen: No focal abnormality.  Normal size. Adrenals/Urinary Tract: Atrophic kidneys bilaterally. No hydronephrosis. No adrenal mass. Urinary bladder contains contrast, question source. No bladder wall abnormality. Stomach/Bowel: Normal appendix. No bowel obstruction. There is mild inflammation in the right upper quadrant adjacent to the cholecystectomy clips. This possibly is related to the distal stomach or proximal duodenum. In addition, there are several small locules of gas adjacent to the descending colon  (image 35 series 3). No diverticula seen in this area on prior study. This is concerning for extraluminal gas. Constellation of findings suggest possibility of peptic ulcer disease  and perforated ulcer. Vascular/Lymphatic: Aortic atherosclerosis. No evidence of aneurysm or adenopathy. Reproductive: Uterus and adnexa unremarkable.  No mass. Other: No free fluid or free air. Musculoskeletal: No acute bony abnormality. IMPRESSION: Inflammation in the right upper quadrant adjacent to the distal stomach, proximal duodenum and cholecystectomy clips. This was not present on prior study. In addition, concern for extraluminal locules of gas adjacent to the descending duodenum. Constellation of findings concerning for peptic ulcer disease with possible perforated ulcer. Small bilateral pleural effusions with bibasilar atelectasis. Aortic atherosclerosis. Atrophic kidneys bilaterally. Contrast is seen within the urinary bladder of unknown source (I do not see a recent contrast enhanced CT). These results were called by telephone at the time of interpretation on 09/12/2021 at 2:46 pm to provider Dr. Lynetta Mare, Who verbally acknowledged these results. Electronically Signed   By: Rolm Baptise M.D.   On: 09/12/2021 14:46   DG CHEST PORT 1 VIEW  Result Date: 09/12/2021 CLINICAL DATA:  Chest pain.  Follow-up. EXAM: PORTABLE CHEST 1 VIEW COMPARISON:  09/08/2021 FINDINGS: Previous median sternotomy. Right internal jugular central line with tips at the SVC RA junction or proximal right atrium. Worsened congestive heart failure with worsened pulmonary edema. No visible pleural effusion. IMPRESSION: Worsened diffuse pulmonary edema. Electronically Signed   By: Nelson Chimes M.D.   On: 09/12/2021 14:57   ECHOCARDIOGRAM LIMITED  Result Date: 09/12/2021    ECHOCARDIOGRAM LIMITED REPORT   Patient Name:   JAYANI ROZMAN Date of Exam: 09/12/2021 Medical Rec #:  [id number]        Height:       65.0 in Accession #:    [id number]        Weight:       216.9 lb Date of Birth:  [birth year]       BSA:          2.048 m Patient Age:    3 years         BP:           95/25 mmHg Patient Gender: F                HR:           651 bpm. Exam Location:  Inpatient Procedure: Limited Echo, Color Doppler and Cardiac Doppler Indications:    Chest Pain R07.9  History:        Patient has prior history of Echocardiogram examinations, most                 recent 06/15/2021. CHF, CAD and Previous Myocardial Infarction,                 PAD, Arrythmias:Bradycardia; Risk Factors:Hypertension,                 Dyslipidemia and Diabetes. End stage renal disease.  Sonographer:    Darlina Sicilian RDCS Referring Phys: [id number] PAULA V ROSS IMPRESSIONS  1. Left ventricular ejection fraction, by estimation, is 35 to 40%. The left ventricle has moderately decreased function. The left ventricle demonstrates global hypokinesis. The left ventricular internal cavity size was mildly dilated. There is mild concentric left ventricular hypertrophy. Left ventricular diastolic parameters are consistent with Grade II diastolic dysfunction (pseudonormalization). Elevated left atrial pressure. There is global left ventricular hypokinesis with severe lateral wall hypokinesis.  2. Right ventricular systolic function is normal. The right ventricular size is normal. Tricuspid regurgitation signal is inadequate for assessing PA pressure.  3. Left atrial size was mildly dilated.  4. Severe  mitral valve regurgitation.  5. The aortic valve is tricuspid. There is mild calcification of the aortic valve. Aortic valve regurgitation is not visualized. Aortic valve sclerosis is present, with no evidence of aortic valve stenosis. Comparison(s): Prior images reviewed side by side. The left ventricular function is significantly worse. The left ventricular wall motion abnormality is new. There is new severe mitral insufficiency due to ischemic posterior leaflet tethering. FINDINGS  Left Ventricle: Left ventricular  ejection fraction, by estimation, is 35 to 40%. The left ventricle has moderately decreased function. The left ventricle demonstrates global hypokinesis. The left ventricular internal cavity size was mildly dilated. There is mild concentric left ventricular hypertrophy. Left ventricular diastolic parameters are consistent with Grade II diastolic dysfunction (pseudonormalization). Elevated left atrial pressure.  LV Wall Scoring: The antero-lateral wall and posterior wall are hypokinetic. There is global left ventricular hypokinesis with severe lateral wall hypokinesis. Right Ventricle: The right ventricular size is normal. No increase in right ventricular wall thickness. Right ventricular systolic function is normal. Tricuspid regurgitation signal is inadequate for assessing PA pressure. Left Atrium: Left atrial size was mildly dilated. Right Atrium: Right atrial size was normal in size. Pericardium: There is no evidence of pericardial effusion. Mitral Valve: Mild to moderate mitral annular calcification. Severe mitral valve regurgitation, with eccentric laterally directed jet. Tricuspid Valve: The tricuspid valve is normal in structure. Tricuspid valve regurgitation is trivial. Aortic Valve: The aortic valve is tricuspid. There is mild calcification of the aortic valve. Aortic valve regurgitation is not visualized. Aortic valve sclerosis is present, with no evidence of aortic valve stenosis. Pulmonic Valve: The pulmonic valve was not well visualized. Pulmonic valve regurgitation is not visualized. Aorta: The aortic root is normal in size and structure. IAS/Shunts: No atrial level shunt detected by color flow Doppler. LEFT VENTRICLE PLAX 2D LVIDd:         5.68 cm LVIDs:         5.04 cm LV PW:         1.24 cm LV IVS:        1.30 cm LVOT diam:     1.95 cm LV SV:         58 LV SV Index:   28 LVOT Area:     2.98 cm  AORTIC VALVE LVOT Vmax:   120.62 cm/s LVOT Vmean:  71.701 cm/s LVOT VTI:    0.194 m MITRAL VALVE MV Area  (plan): 2.62 cm  SHUNTS                           Systemic VTI:  0.19 m                           Systemic Diam: 1.95 cm Sanda Klein MD Electronically signed by Sanda Klein MD Signature Date/Time: 09/12/2021/2:39:01 PM    Final     Cardiac Studies    CT Abdomen/Pelvis 09/12/21: IMPRESSION: Inflammation in the right upper quadrant adjacent to the distal stomach, proximal duodenum and cholecystectomy clips. This was not present on prior study. In addition, concern for extraluminal locules of gas adjacent to the descending duodenum. Constellation of findings concerning for peptic ulcer disease with possible perforated ulcer.   Small bilateral pleural effusions with bibasilar atelectasis.   Aortic atherosclerosis.   Atrophic kidneys bilaterally. Contrast is seen within the urinary bladder of unknown source (I do not see a recent contrast enhanced CT).   These results  were called by telephone at the time of interpretation on 09/12/2021 at 2:46 pm to provider Dr. Lynetta Mare, Who verbally acknowledged these results.  TTE 09/12/21: IMPRESSIONS   1. Left ventricular ejection fraction, by estimation, is 35 to 40%. The  left ventricle has moderately decreased function. The left ventricle  demonstrates global hypokinesis. The left ventricular internal cavity size  was mildly dilated. There is mild  concentric left ventricular hypertrophy. Left ventricular diastolic  parameters are consistent with Grade II diastolic dysfunction  (pseudonormalization). Elevated left atrial pressure. There is global left  ventricular hypokinesis with severe lateral wall  hypokinesis.   2. Right ventricular systolic function is normal. The right ventricular  size is normal. Tricuspid regurgitation signal is inadequate for assessing  PA pressure.   3. Left atrial size was mildly dilated.   4. Severe mitral valve regurgitation.   5. The aortic valve is tricuspid. There is mild calcification of the   aortic valve. Aortic valve regurgitation is not visualized. Aortic valve  sclerosis is present, with no evidence of aortic valve stenosis.   Comparison(s): Prior images reviewed side by side. The left ventricular  function is significantly worse. The left ventricular wall motion  abnormality is new. There is new severe mitral insufficiency due to  ischemic posterior leaflet tethering.    LHC 09/09/21       Ost LM to Dist LM lesion is 60% stenosed.   Balloon angioplasty was performed using a BALLN SAPPHIRE [place] 3.5X8.   Post intervention, there is a 30% residual stenosis.   Successful PCI of the left main into the LCx with Shockwave therapy and aggressive balloon angioplasty. Significant improvement in stent expansion. There is occlusion of the first OM but this vessel was already severely diseased.      Plan: DAPT with ASA and Plavix indefinitely. Continue aggressive medical therapy for residual disease. Anticipate dialysis tomorrow.  LHC 09/08/2021   Ost LM to Dist LM lesion is 50% stenosed.   Prox LAD to Mid LAD lesion is 100% stenosed.   Ost LAD to Prox LAD lesion is 100% stenosed.   Ost Cx to Mid Cx lesion is 40% stenosed.   Mid RCA to Dist RCA lesion is 30% stenosed.   Prox RCA lesion is 100% stenosed.   Origin lesion is 20% stenosed.   Mid Graft lesion is 15% stenosed.   Prox Graft-1 lesion is 15% stenosed.   Origin to Prox Graft lesion before 1st Mrg  is 100% stenosed.   Origin to Prox Graft lesion is 35% stenosed.   1st Mrg-1 lesion is 99% stenosed.   1st Mrg-2 lesion is 75% stenosed.   LPAV lesion is 80% stenosed.   Non-stenotic Prox Graft-2 lesion was previously treated.   SVG.   SVG due to known occlusion.   LIMA and is normal in caliber.   The graft exhibits moderate focal disease.   LV end diastolic pressure is normal.   Severe 3 vessel obstructive CAD. Co dominant circulation.  The prior angioplasty site of the left main and LCx is improved with 50% residual.  This was 90% before.  Patent LIMA to the LAD. 30% in stent disease in the ostial LIMA Occluded SVG sequential to the OM1 and left PDA- chronic Patent SVG to the RCA. Stents with mild nonobstructive disease. Patent stent in the left subclavian. There is a 20 mm Hg pressure gradient. But the stent appears to be widely patent.  Normal LVEDP   Plan: would recommend continued medical therapy.  There are no suitable targets for PCI and prior PCI sites are patent  Patient Profile     65 y.o. female with extensive revascularization history (obstructive RCA with diseased SVG, Obstructive LM with LIMA --> LAD and prior subclavian stent, occluded SVG --> OM1, LM 99% s/p POBA of ISR 08/26/21) now with recurrent chest pain, s/p LHC 09/08/21 with extensive disease as detailed above and PCI to LM on 12/23. Continues to have intermittent chest pain despite intervention on LM deemed to not be a candidate for further revascularization.   Assessment & Plan    #Shock: Thought to be most likely cardiogenic in nature given benign abdominal examination with lower suspicion of underlying sepsis. As detailed below, patient has extensive native vessel CAD s/p bypass and recent PCI to LM on 12/23. Deemed not a candidate for further intervention. TTE 12/26 with drop in EF from 50-55% to 35-40% with anterolateral and inferior wall hypokinesis, severe MR secondary to posterior leaflet tethering, G2DD, normal RV. Unfortunately, patient has limited options with no further PCI targets and she is not a surgical candidate. Given overall poor prognosis with limited therapeutic options, palliative care consulted.  -Continue medical management of CAD as detailed below -Continue levophed for goal MAP>65 -Surgery following for possible PUD; on conservative management -Volume management with CRRT -Given limited options and overall poor prognosis, palliative care consulted   #Extensive Multivessel CAD: Cath 12/22 with severe 3 vessel  native disease as detailed above. Patent LIMA-LAD. Occluded sequential SVG-OM and PDA, Patent SVG-RCA. Patent subclavian stent. Underwent successful PCI to LM on 12/23. Per discussion with Interventional, patient is not a candidate for further intervention. -Continue ASA 81mg  daily -Continue plavix 75mg  daily -Continue lipitor 80mg , zetia 10mg  daily -Continue ranexa 500mg  daily (dosed for ESRD) -Cannot tolerate BB, ARB due to hypotension -Palliative care consulted; appreciate recommendations  #Severe MR: Likely ischemic MR with tethering of the posterior leaflet. Not a candidate for further revascularization or MVR. Will need to continue with medical management as able. Palliative care consulted as above. -Continue medical management as not a candidate for further intervention or surgery -Volume management with CRRT -Palliative care consulted as above  #Possible contained perforated PUD: CT 12/26 with inflammation RUQ, extraluminal gas adjacent to descending duodenum with no free fluid/air. Surgery consulted and given benign abdominal exam, currently recommending conservative management. -Management per general surgery  #ESRD on HD: -Renal following, appreciate recommendations -On CRRT -Palliative care meeting today  #HLD: -Continue lipitor 80mg  daily  #DMII: -ISS  CRITICAL CARE TIME: I have spent a total of 35 minutes with patient reviewing hospital notes, telemetry, EKGs, labs and examining the patient as well as establishing an assessment and plan that was discussed with the patient.  > 50% of time was spent in direct patient care. The patient is critically ill with multi-organ system failure and requires high complexity decision making for assessment and support, frequent evaluation and titration of therapies, application of advanced monitoring technologies and extensive interpretation of multiple databases.   For questions or updates, please contact [place] Please consult  [email or web address] for contact info under        Signed, Freada Bergeron, MD  09/14/2021, 8:33 AM

## 2021-09-14 NOTE — Progress Notes (Signed)
[facility] Progress Note    Assessment/ Plan:   Volume excess/ pulm edema - diffuse pulm edema by CXR, getting fluid off w/ CRRT is now down to 5kg over. ^UF to 200 cc /hr.  ESRD: w/ hyperkalemia - had HD 12/26 shortened somewhat. Started on CRRT 12/27 due to hypotension w/ pulm edema on pressors support and high K+ Hypotension - unsure exact etiology, cardiogenic vs other. Low dose pressors today, weaning down Chest Pain/unstable angina, recent NSTEMI -per cardiology. S/p card cath 12/22. SP 12/23 PCI to left main into Lcx w/ balloon angio and shockwave therapy, first OM not salvageable (severe disease). LVEF 35-40% by 12/27 echo Anemia of Chronic Kidney Disease: Hemoglobin 8.0. on esa MBD ckd - monitor phos. renal diet, will start binders if needed Vascular access: RIJ TDC, due for 2nd stage R AVF surgery, at a later date when more stable   Kelly Splinter, MD 09/14/2021, 9:31 AM   OP HD: TTS [facility]  4h 87min  93.5kg   2/2.5 bath  400/500   Hep none  RIJ TDC  - mircera 30 q4  - venofer 50 weekly  - calcitriol 0.5 ug tiw po    Subjective:   Seen in ICU, BP's better, levo down to 2 ug/min.  2kg down today.    Objective:   BP (!) 68/50    Pulse 65    Temp 98.4 F (36.9 C) (Axillary)    Resp (!) 9    Ht 5\' 5"  (1.651 m)    Wt 98.5 kg    SpO2 100%    BMI 36.14 kg/m   Intake/Output Summary (Last 24 hours) at 09/14/2021 0931 Last data filed at 09/14/2021 0900 Gross per 24 hour  Intake 1100.96 ml  Output 2696 ml  Net -1595.04 ml    Weight change: -1.9 kg  Physical Exam: Gen:NAD, no ^ wob, calm, alert today CVS:s1s2 no RG Resp:cta bl, normal wob XBM:WUXL Ext: 1+ bilat UE/ LE edema Neuro: awake, alert Dialysis access: RUE AVF (needs 2nd stage), RIJ TDC (active access)    BMET Recent Labs  Lab 09/09/21 1806 09/10/21 0119 09/11/21 0327 09/12/21 0456 09/13/21 0521 09/13/21 1704 09/14/21 0433  NA 131* 131* 129* 132* 133* 134* 135  K 4.1 4.8 5.1 5.2*  5.6* 5.1 4.6  CL 95* 94* 93* 96* 97* 98 100  CO2 25 25 23  21* 21* 18* 24  GLUCOSE 244* 225* 190* 215* 217* 99 134*  BUN 29* 34* 49* 64* 45* 45* 29*  CREATININE 5.42* 5.93* 7.45* 8.39* 6.03* 5.50* 3.43*  CALCIUM 8.5* 9.1 8.9 9.0 8.7* 8.9 8.5*  PHOS 5.9*  --   --   --   --  5.7* 3.4    CBC Recent Labs  Lab 09/11/21 0327 09/12/21 0456 09/12/21 1456 09/13/21 0521  WBC 10.2 11.0* 14.8* 14.6*  HGB 8.0* 7.9* 8.3* 8.4*  HCT 24.4* 24.8* 25.4* 26.4*  MCV 107.5* 106.9* 108.5* 108.6*  PLT 149* 153 199 168     Medications:     allopurinol  100 mg Oral Q T,Th,Sa-HD   aspirin  150 mg Rectal Daily   atorvastatin  80 mg Oral QHS   Chlorhexidine Gluconate Cloth  6 each Topical Daily   Chlorhexidine Gluconate Cloth  6 each Topical Q0600   clopidogrel  75 mg Oral Daily   darbepoetin (ARANESP) injection - DIALYSIS  40 mcg Intravenous Q Thu-HD   ezetimibe  10 mg Oral QHS   ferric citrate  420 mg Oral  TID WC   fluticasone furoate-vilanterol  1 puff Inhalation Daily   gabapentin  400 mg Oral QHS   heparin  5,000 Units Subcutaneous Q8H   insulin aspart  0-15 Units Subcutaneous TID WC   insulin aspart  0-5 Units Subcutaneous QHS   isosorbide mononitrate  15 mg Oral Daily   levothyroxine  62.5 mcg Intravenous Daily   midodrine  10 mg Oral 3 times per day   pantoprazole (PROTONIX) IV  40 mg Intravenous Q12H   pantoprazole (PROTONIX) IV  40 mg Intravenous Q12H   ranolazine  500 mg Oral QHS

## 2021-09-14 NOTE — Progress Notes (Addendum)
Progress Note  5 Days Post-Op  Subjective: Very drowsy this morning and requiring a lot of encouragement to answer questions. 2-3 episodes of emesis yesterday (bilious) and mild nausea currently after having just gotten zofran. Passing flatus. States her abdomen is tender this morning. She is able to recall meeting me yesterday and our conversation  Objective: Vital signs in last 24 hours: Temp:  [96 F (35.6 C)-98.4 F (36.9 C)] 98.4 F (36.9 C) (12/28 0700) Pulse Rate:  [32-76] 65 (12/28 0700) Resp:  [9-28] 9 (12/28 0700) SpO2:  [59 %-100 %] 100 % (12/28 0700) Arterial Line BP: (95-168)/(41-71) 118/46 (12/28 0700) Weight:  [98.5 kg] 98.5 kg (12/28 0500) Last BM Date: 09/08/21  Intake/Output from previous day: 12/27 0701 - 12/28 0700 In: 1056.2 [I.V.:706.3; IV Piggyback:349.9] Out: 2696 [Emesis/NG output:250] Intake/Output this shift: No intake/output data recorded.  PE: General: drowsy, ill appearing, female who is laying in bed in NAD HEENT: head is normocephalic, atraumatic. Mouth is pink and moist Heart: regular, rate, and rhythm.  Lungs: CTAB. Respiratory effort nonlabored on supplemental O2 via [place] Abd: soft, ND, +BS, mild diffuse TTP without rebound or guarding MSK: all 4 extremities are symmetrical with no cyanosis, clubbing. Mild LE edema bilaterally Skin: warm and dry Psych: A&Ox3   Lab Results:  Recent Labs    09/12/21 1456 09/13/21 0521  WBC 14.8* 14.6*  HGB 8.3* 8.4*  HCT 25.4* 26.4*  PLT 199 168    BMET Recent Labs    09/13/21 1704 09/14/21 0433  NA 134* 135  K 5.1 4.6  CL 98 100  CO2 18* 24  GLUCOSE 99 134*  BUN 45* 29*  CREATININE 5.50* 3.43*  CALCIUM 8.9 8.5*    PT/INR No results for input(s): LABPROT, INR in the last 72 hours. CMP     Component Value Date/Time   NA 135 09/14/2021 0433   K 4.6 09/14/2021 0433   CL 100 09/14/2021 0433   CO2 24 09/14/2021 0433   GLUCOSE 134 (H) 09/14/2021 0433   BUN 29 (H) 09/14/2021 0433    CREATININE 3.43 (H) 09/14/2021 0433   CALCIUM 8.5 (L) 09/14/2021 0433   CALCIUM 8.7 10/27/2013 1103   PROT 5.7 (L) 09/14/2021 0433   ALBUMIN 3.0 (L) 09/14/2021 0433   AST 322 (H) 09/14/2021 0433   ALT 203 (H) 09/14/2021 0433   ALKPHOS 199 (H) 09/14/2021 0433   BILITOT 1.1 09/14/2021 0433   GFRNONAA 14 (L) 09/14/2021 0433   GFRAA 9 (L) 06/21/2020 1043   Lipase     Component Value Date/Time   LIPASE 37 08/13/2015 2030       Studies/Results: CT ABDOMEN PELVIS WO CONTRAST  Result Date: 09/12/2021 CLINICAL DATA:  Right upper quadrant pain nondiagnostic ultrasound EXAM: CT ABDOMEN AND PELVIS WITHOUT CONTRAST TECHNIQUE: Multidetector CT imaging of the abdomen and pelvis was performed following the standard protocol without IV contrast. COMPARISON:  None. FINDINGS: Lower chest: Small bilateral pleural effusions with compressive atelectasis of lower lobes. Heart is mildly enlarged. Scattered calcifications in the coronary arteries and aorta. Hepatobiliary: Prior cholecystectomy.  No focal hepatic abnormality. Pancreas: No focal abnormality or ductal dilatation. Spleen: No focal abnormality.  Normal size. Adrenals/Urinary Tract: Atrophic kidneys bilaterally. No hydronephrosis. No adrenal mass. Urinary bladder contains contrast, question source. No bladder wall abnormality. Stomach/Bowel: Normal appendix. No bowel obstruction. There is mild inflammation in the right upper quadrant adjacent to the cholecystectomy clips. This possibly is related to the distal stomach or proximal duodenum. In addition, there  are several small locules of gas adjacent to the descending colon (image 35 series 3). No diverticula seen in this area on prior study. This is concerning for extraluminal gas. Constellation of findings suggest possibility of peptic ulcer disease and perforated ulcer. Vascular/Lymphatic: Aortic atherosclerosis. No evidence of aneurysm or adenopathy. Reproductive: Uterus and adnexa unremarkable.  No  mass. Other: No free fluid or free air. Musculoskeletal: No acute bony abnormality. IMPRESSION: Inflammation in the right upper quadrant adjacent to the distal stomach, proximal duodenum and cholecystectomy clips. This was not present on prior study. In addition, concern for extraluminal locules of gas adjacent to the descending duodenum. Constellation of findings concerning for peptic ulcer disease with possible perforated ulcer. Small bilateral pleural effusions with bibasilar atelectasis. Aortic atherosclerosis. Atrophic kidneys bilaterally. Contrast is seen within the urinary bladder of unknown source (I do not see a recent contrast enhanced CT). These results were called by telephone at the time of interpretation on 09/12/2021 at 2:46 pm to provider Dr. Lynetta Mare, Who verbally acknowledged these results. Electronically Signed   By: Rolm Baptise M.D.   On: 09/12/2021 14:46   DG CHEST PORT 1 VIEW  Result Date: 09/12/2021 CLINICAL DATA:  Chest pain.  Follow-up. EXAM: PORTABLE CHEST 1 VIEW COMPARISON:  09/08/2021 FINDINGS: Previous median sternotomy. Right internal jugular central line with tips at the SVC RA junction or proximal right atrium. Worsened congestive heart failure with worsened pulmonary edema. No visible pleural effusion. IMPRESSION: Worsened diffuse pulmonary edema. Electronically Signed   By: Nelson Chimes M.D.   On: 09/12/2021 14:57   ECHOCARDIOGRAM LIMITED  Result Date: 09/12/2021    ECHOCARDIOGRAM LIMITED REPORT   Patient Name:   Donna Howe Date of Exam: 09/12/2021 Medical Rec #:  [id number]        Height:       65.0 in Accession #:    [id number]       Weight:       216.9 lb Date of Birth:  25-Dec-1955       BSA:          2.048 m Patient Age:    65 years         BP:           95/25 mmHg Patient Gender: F                HR:           651 bpm. Exam Location:  Inpatient Procedure: Limited Echo, Color Doppler and Cardiac Doppler Indications:    Chest Pain R07.9  History:        Patient  has prior history of Echocardiogram examinations, most                 recent 06/15/2021. CHF, CAD and Previous Myocardial Infarction,                 PAD, Arrythmias:Bradycardia; Risk Factors:Hypertension,                 Dyslipidemia and Diabetes. End stage renal disease.  Sonographer:    Darlina Sicilian RDCS Referring Phys: [id number] PAULA V ROSS IMPRESSIONS  1. Left ventricular ejection fraction, by estimation, is 35 to 40%. The left ventricle has moderately decreased function. The left ventricle demonstrates global hypokinesis. The left ventricular internal cavity size was mildly dilated. There is mild concentric left ventricular hypertrophy. Left ventricular diastolic parameters are consistent with Grade II diastolic dysfunction (pseudonormalization). Elevated left atrial pressure. There is global left ventricular  hypokinesis with severe lateral wall hypokinesis.  2. Right ventricular systolic function is normal. The right ventricular size is normal. Tricuspid regurgitation signal is inadequate for assessing PA pressure.  3. Left atrial size was mildly dilated.  4. Severe mitral valve regurgitation.  5. The aortic valve is tricuspid. There is mild calcification of the aortic valve. Aortic valve regurgitation is not visualized. Aortic valve sclerosis is present, with no evidence of aortic valve stenosis. Comparison(s): Prior images reviewed side by side. The left ventricular function is significantly worse. The left ventricular wall motion abnormality is new. There is new severe mitral insufficiency due to ischemic posterior leaflet tethering. FINDINGS  Left Ventricle: Left ventricular ejection fraction, by estimation, is 35 to 40%. The left ventricle has moderately decreased function. The left ventricle demonstrates global hypokinesis. The left ventricular internal cavity size was mildly dilated. There is mild concentric left ventricular hypertrophy. Left ventricular diastolic parameters are consistent with Grade II  diastolic dysfunction (pseudonormalization). Elevated left atrial pressure.  LV Wall Scoring: The antero-lateral wall and posterior wall are hypokinetic. There is global left ventricular hypokinesis with severe lateral wall hypokinesis. Right Ventricle: The right ventricular size is normal. No increase in right ventricular wall thickness. Right ventricular systolic function is normal. Tricuspid regurgitation signal is inadequate for assessing PA pressure. Left Atrium: Left atrial size was mildly dilated. Right Atrium: Right atrial size was normal in size. Pericardium: There is no evidence of pericardial effusion. Mitral Valve: Mild to moderate mitral annular calcification. Severe mitral valve regurgitation, with eccentric laterally directed jet. Tricuspid Valve: The tricuspid valve is normal in structure. Tricuspid valve regurgitation is trivial. Aortic Valve: The aortic valve is tricuspid. There is mild calcification of the aortic valve. Aortic valve regurgitation is not visualized. Aortic valve sclerosis is present, with no evidence of aortic valve stenosis. Pulmonic Valve: The pulmonic valve was not well visualized. Pulmonic valve regurgitation is not visualized. Aorta: The aortic root is normal in size and structure. IAS/Shunts: No atrial level shunt detected by color flow Doppler. LEFT VENTRICLE PLAX 2D LVIDd:         5.68 cm LVIDs:         5.04 cm LV PW:         1.24 cm LV IVS:        1.30 cm LVOT diam:     1.95 cm LV SV:         58 LV SV Index:   28 LVOT Area:     2.98 cm  AORTIC VALVE LVOT Vmax:   120.62 cm/s LVOT Vmean:  71.701 cm/s LVOT VTI:    0.194 m MITRAL VALVE MV Area (plan): 2.62 cm  SHUNTS                           Systemic VTI:  0.19 m                           Systemic Diam: 1.95 cm Dani Gobble Croitoru MD Electronically signed by Sanda Klein MD Signature Date/Time: 09/12/2021/2:39:01 PM    Final     Anti-infectives: Anti-infectives (From admission, onward)    Start     Dose/Rate Route  Frequency Ordered Stop   09/12/21 1715  cefTRIAXone (ROCEPHIN) 2 g in sodium chloride 0.9 % 100 mL IVPB        2 g 200 mL/hr over 30 Minutes Intravenous Every 24 hours 09/12/21 1619  09/12/21 1715  metroNIDAZOLE (FLAGYL) IVPB 500 mg        500 mg 100 mL/hr over 60 Minutes Intravenous Every 12 hours 09/12/21 1619          Assessment/Plan Possible contained perforated PUD  - CT 12/26 with inflammation RUQ, extraluminal gas adjacent to descending duodenum - no free fluid/air - now on CRRT, worsening HF with poor prognosis, and palliative following - family meeting today - emesis yesterday and some abdominal tenderness today - in setting of comorbid issues do not think this is due to worsening ulcer/perforation and plan to start CLD this am and advance as tolerated - cont IV abx, bid PPI  FEN: CLD VTE: heparin subq, plavix ID: ceftriaxone/flagyl  ESRD HTB HLD DM CAD Mitral regurg    LOS: 6 days    Winferd Humphrey, [facility] Surgery 09/14/2021, 8:17 AM Please see Amion for pager number during day hours 7:00am-4:30pm

## 2021-09-14 NOTE — Progress Notes (Signed)
Met with family and palliative.  Discussed terminal nature of her heart condition combined with inability to tolerate PO . Brother would like to get here from [place] to say goodbye but would not like suffering prolonged to make this happen.  She appears reasonably comfortable now.  We all agree to continue CRRT, not escalate care, and patient to be DNR.  She she decline further we will transition to full comfort.  When brother does get here to say goodbye we will transition to comfort care.  All questions answered, appreciate NP Gill's help.  Erskine Emery MD PCCM

## 2021-09-15 DIAGNOSIS — L8990 Pressure ulcer of unspecified site, unspecified stage: Secondary | ICD-10-CM | POA: Insufficient documentation

## 2021-09-15 DIAGNOSIS — I200 Unstable angina: Secondary | ICD-10-CM | POA: Diagnosis not present

## 2021-09-15 LAB — HEPATIC FUNCTION PANEL
ALT: 752 U/L — ABNORMAL HIGH (ref 0–44)
AST: 1580 U/L — ABNORMAL HIGH (ref 15–41)
Albumin: 2.9 g/dL — ABNORMAL LOW (ref 3.5–5.0)
Alkaline Phosphatase: 186 U/L — ABNORMAL HIGH (ref 38–126)
Bilirubin, Direct: 0.4 mg/dL — ABNORMAL HIGH (ref 0.0–0.2)
Indirect Bilirubin: 0.8 mg/dL (ref 0.3–0.9)
Total Bilirubin: 1.2 mg/dL (ref 0.3–1.2)
Total Protein: 6 g/dL — ABNORMAL LOW (ref 6.5–8.1)

## 2021-09-15 LAB — RENAL FUNCTION PANEL
Albumin: 2.9 g/dL — ABNORMAL LOW (ref 3.5–5.0)
Albumin: 3.1 g/dL — ABNORMAL LOW (ref 3.5–5.0)
Anion gap: 9 (ref 5–15)
Anion gap: 11 (ref 5–15)
BUN: 14 mg/dL (ref 8–23)
BUN: 12 mg/dL (ref 8–23)
CO2: 23 mmol/L (ref 22–32)
CO2: 24 mmol/L (ref 22–32)
Calcium: 8.5 mg/dL — ABNORMAL LOW (ref 8.9–10.3)
Calcium: 8.7 mg/dL — ABNORMAL LOW (ref 8.9–10.3)
Chloride: 101 mmol/L (ref 98–111)
Chloride: 100 mmol/L (ref 98–111)
Creatinine, Ser: 1.83 mg/dL — ABNORMAL HIGH (ref 0.44–1.00)
Creatinine, Ser: 1.43 mg/dL — ABNORMAL HIGH (ref 0.44–1.00)
GFR, Estimated: 31 mL/min — ABNORMAL LOW (ref >60)
GFR, Estimated: 41 mL/min — ABNORMAL LOW (ref >60)
Glucose, Bld: 173 mg/dL — ABNORMAL HIGH (ref 70–99)
Glucose, Bld: 188 mg/dL — ABNORMAL HIGH (ref 70–99)
Phosphorus: 1.9 mg/dL — ABNORMAL LOW (ref 2.5–4.6)
Phosphorus: 1.5 mg/dL — ABNORMAL LOW (ref 2.5–4.6)
Potassium: 4.1 mmol/L (ref 3.5–5.1)
Potassium: 4 mmol/L (ref 3.5–5.1)
Sodium: 133 mmol/L — ABNORMAL LOW (ref 135–145)
Sodium: 135 mmol/L (ref 135–145)

## 2021-09-15 LAB — APTT: aPTT: 100 seconds — ABNORMAL HIGH (ref 24–36)

## 2021-09-15 LAB — GLUCOSE, CAPILLARY
Glucose-Capillary: 164 mg/dL — ABNORMAL HIGH (ref 70–99)
Glucose-Capillary: 188 mg/dL — ABNORMAL HIGH (ref 70–99)
Glucose-Capillary: 180 mg/dL — ABNORMAL HIGH (ref 70–99)
Glucose-Capillary: 173 mg/dL — ABNORMAL HIGH (ref 70–99)
Glucose-Capillary: 154 mg/dL — ABNORMAL HIGH (ref 70–99)

## 2021-09-15 LAB — MAGNESIUM: Magnesium: 2.4 mg/dL (ref 1.7–2.4)

## 2021-09-15 MED ORDER — DARBEPOETIN ALFA 40 MCG/0.4ML IJ SOSY
40.0000 ug | PREFILLED_SYRINGE | INTRAMUSCULAR | Status: DC
  Filled 2021-09-15: qty 0.4

## 2021-09-15 NOTE — Progress Notes (Signed)
0700 - Report received from PM RN.  All questions answered.  Safety checks performed.  All lines and drips verified.  CRRT settings verified. Hand hygiene performed before/after each pt contact. 0730 - Assessment & Rx.  Pt given CHG bath w/PM RN. 0930 - Dr. Tamala Julian rounded on pt. [phone] - Cardiology rounded. [facility] rounded. 1600 - Dressings replaced on a-line, CVC, and HD caths using sterile technique.  Antimicrobial disks replaced.  Assisted by Gerald Stabs, RN.  1800 - Pt refused repositioning. 18 - Pt's family arrived to visit. 1900 - Report given to PM RN.  All questions answered.

## 2021-09-15 NOTE — Progress Notes (Signed)
° °  NAME:  Donna Howe, MRN:  [id number], DOB:  22-Mar-1956, LOS: 7 ADMISSION DATE:  09/08/2021, CONSULTATION DATE:  09/12/2021 REFERRING MD:  Dr. Harrington Challenger, CHIEF COMPLAINT: Hypotension with hypoxia  History of Present Illness:  Donna Howe 65 year old female with an extensive past medical history status post CABG in 2005 with multiple PCI's since, multiple NSTEMI's and has been deemed too high risk for CABG re-do. HTN. HLD, type 2 diabetes, and ESRD on HD MWF who presented to the emergency department with complaints of recurrent chest pain.  Patient reports several episodes of substernal chest pain unrelieved by nitroglycerin (utilized and entire bottle prior to admission and 1 week)  Cardiology evaluated patient on admission and plan was made to repeat heart catheterization.  This was performed 12/22 and revealed three-vessel occlusive CAD.  Patient was taken back for heart catheterization 12/23 for PCI and balloon angioplasty was performed to left main.   Critical care consulted 12/26 due to persistent severe hypertension  Pertinent  Medical History  Prior CABG in 2005 with multiple PCI's since, multiple NSTEMI's and has been deemed too high risk for CABG re-do. HTN. HLD, type 2 diabetes, and ESRD on HD MWF  Significant Hospital Events: Including procedures, antibiotic start and stop dates in addition to other pertinent events   12/22 admitted with complaints of chest pain underwent diagnostic left heart cath which revealed severe three-vessel occlusive disease 12/23 underwent balloon angioplasty to left main 12/26 developed significant hypotension resulting in transfer ICU for further management  Interim History / Subjective:  Had back pain and more nausea this AM, given phenergan and dilaudid to good effect. Tolerating fluid removal with CRRT  Objective   Blood pressure (!) 68/50, pulse 83, temperature 97.6 F (36.4 C), temperature source Oral, resp. rate (!) 25, height 5\' 5"   (1.651 m), weight 98.5 kg, SpO2 90 %.        Intake/Output Summary (Last 24 hours) at 09/15/2021 0933 Last data filed at 09/15/2021 0800 Gross per 24 hour  Intake 1422.27 ml  Output 5157 ml  Net -3734.73 ml    Filed Weights   09/12/21 0534 09/13/21 0500 09/14/21 0500  Weight: 98.4 kg 100.4 kg 98.5 kg    Examination: Ill appearing woman somnolent in bed Resp efforts shallow Abdomen soft, hypoactive BS  Labs stable  Resolved Hospital Problem list     Assessment & Plan:  NSTEMI without further revascularization options Mixed cardiogenic and distributive shock Ischemic mitral failure and cardiomyopathy Congestive hepatopathy from worsening heart failure Question duodenitis vs. Contained perforation- conservative management, not operative candidate ESRD on CRRT DM2 with hypoglycemia  - See GOC discussion from 12/28 - Continue CRRT while awaiting brother, no escalation of care, transition to full comfort should she have any signs of suffering or deterioration  Erskine Emery MD  Pulmonary Critical Care  Prefer epic messenger for cross cover needs If after hours, please call E-link

## 2021-09-15 NOTE — Progress Notes (Signed)
Patient being transitioned to comfort measures once brother arrives from [place]. Will continue current level of treatment until that time. No changes from CV perspective. Cardiology will sign-off.  Gwyndolyn Kaufman, MD

## 2021-09-15 NOTE — Progress Notes (Signed)
[facility] Progress Note    Assessment/ Plan:   GOC - end stage heart disease, waiting now for family to arrive then will shift to comfort care.   Volume excess - improved w/ CRRT ESRD: usual HD TTS.  Started CRRT on 12/27.  Hypotension - likely cardiogenic shock.  Chest Pain/unstable angina, recent NSTEMI -per cardiology. S/p card cath 12/22. SP 12/23 PCI to left main into Lcx w/ balloon angio and shockwave therapy, first OM not salvageable (severe disease). LVEF 35-40% by 12/27 echo Anemia of Chronic Kidney Disease: Hemoglobin 8.0 MBD ckd - monitor phos. renal diet    Kelly Splinter, MD 09/15/2021, 1:46 PM   OP HD: TTS [facility]  4h 70min  93.5kg   2/2.5 bath  400/500   Hep none  RIJ TDC  - mircera 30 q4  - venofer 50 weekly  - calcitriol 0.5 ug tiw po    Subjective:   Seen in ICU   Objective:   BP (!) 68/50    Pulse 66    Temp 97.7 F (36.5 C) (Axillary)    Resp 12    Ht 5\' 5"  (1.651 m)    Wt 98.5 kg    SpO2 100%    BMI 36.14 kg/m   Intake/Output Summary (Last 24 hours) at 09/15/2021 1346 Last data filed at 09/15/2021 1300 Gross per 24 hour  Intake 1513.25 ml  Output 5793 ml  Net -4279.75 ml    Weight change:   Physical Exam: Gen:NAD, no ^ wob, calm, alert today CVS:s1s2 no RG Resp:cta bl, normal wob XQJ:JHER Ext: 1+ bilat UE/ LE edema Neuro: awake, alert Dialysis access: [facility] (active access)    BMET Recent Labs  Lab 09/09/21 1806 09/10/21 0119 09/11/21 0327 09/12/21 0456 09/13/21 0521 09/13/21 1704 09/14/21 0433 09/14/21 1739 09/15/21 0430  NA 131*   < > 129* 132* 133* 134* 135 135 133*  K 4.1   < > 5.1 5.2* 5.6* 5.1 4.6 4.3 4.1  CL 95*   < > 93* 96* 97* 98 100 101 101  CO2 25   < > 23 21* 21* 18* 24 24 23   GLUCOSE 244*   < > 190* 215* 217* 99 134* 152* 173*  BUN 29*   < > 49* 64* 45* 45* 29* 19 14  CREATININE 5.42*   < > 7.45* 8.39* 6.03* 5.50* 3.43* 2.36* 1.83*  CALCIUM 8.5*   < > 8.9 9.0 8.7* 8.9 8.5* 8.8* 8.5*  PHOS 5.9*   --   --   --   --  5.7* 3.4 2.2* 1.9*   < > = values in this interval not displayed.    CBC Recent Labs  Lab 09/11/21 0327 09/12/21 0456 09/12/21 1456 09/13/21 0521  WBC 10.2 11.0* 14.8* 14.6*  HGB 8.0* 7.9* 8.3* 8.4*  HCT 24.4* 24.8* 25.4* 26.4*  MCV 107.5* 106.9* 108.5* 108.6*  PLT 149* 153 199 168     Medications:     allopurinol  100 mg Oral Q T,Th,Sa-HD   aspirin  150 mg Rectal Daily   atorvastatin  80 mg Oral QHS   Chlorhexidine Gluconate Cloth  6 each Topical Daily   Chlorhexidine Gluconate Cloth  6 each Topical Q0600   clopidogrel  75 mg Oral Daily   ezetimibe  10 mg Oral QHS   ferric citrate  420 mg Oral TID WC   fluticasone furoate-vilanterol  1 puff Inhalation Daily   gabapentin  400 mg Oral QHS   heparin  5,000 Units Subcutaneous Q8H   insulin aspart  0-15 Units Subcutaneous TID WC   insulin aspart  0-5 Units Subcutaneous QHS   isosorbide mononitrate  15 mg Oral Daily   levothyroxine  62.5 mcg Intravenous Daily   midodrine  10 mg Oral 3 times per day   pantoprazole (PROTONIX) IV  40 mg Intravenous Q12H   sodium chloride flush  10-40 mL Intracatheter Q12H

## 2021-09-15 NOTE — Progress Notes (Signed)
Patient ID: Donna Howe, female   DOB: 12-Jan-1956, 65 y.o.   MRN: [id number]   Given overall condition, nothing further to offer from a surgical standpoint so we will sign off

## 2021-09-16 DIAGNOSIS — E1165 Type 2 diabetes mellitus with hyperglycemia: Secondary | ICD-10-CM | POA: Diagnosis not present

## 2021-09-16 DIAGNOSIS — Z515 Encounter for palliative care: Secondary | ICD-10-CM | POA: Diagnosis not present

## 2021-09-16 DIAGNOSIS — I10 Essential (primary) hypertension: Secondary | ICD-10-CM | POA: Diagnosis not present

## 2021-09-16 DIAGNOSIS — I70201 Unspecified atherosclerosis of native arteries of extremities, right leg: Secondary | ICD-10-CM | POA: Diagnosis not present

## 2021-09-16 DIAGNOSIS — Z87891 Personal history of nicotine dependence: Secondary | ICD-10-CM | POA: Diagnosis not present

## 2021-09-16 DIAGNOSIS — Z794 Long term (current) use of insulin: Secondary | ICD-10-CM | POA: Diagnosis not present

## 2021-09-16 DIAGNOSIS — Z7189 Other specified counseling: Secondary | ICD-10-CM | POA: Diagnosis not present

## 2021-09-16 DIAGNOSIS — I200 Unstable angina: Secondary | ICD-10-CM | POA: Diagnosis not present

## 2021-09-16 LAB — RENAL FUNCTION PANEL
Albumin: 3.2 g/dL — ABNORMAL LOW (ref 3.5–5.0)
Albumin: 3.2 g/dL — ABNORMAL LOW (ref 3.5–5.0)
Anion gap: 9 (ref 5–15)
Anion gap: 12 (ref 5–15)
BUN: 10 mg/dL (ref 8–23)
BUN: 11 mg/dL (ref 8–23)
CO2: 23 mmol/L (ref 22–32)
CO2: 24 mmol/L (ref 22–32)
Calcium: 8.8 mg/dL — ABNORMAL LOW (ref 8.9–10.3)
Calcium: 9 mg/dL (ref 8.9–10.3)
Chloride: 102 mmol/L (ref 98–111)
Chloride: 99 mmol/L (ref 98–111)
Creatinine, Ser: 1.51 mg/dL — ABNORMAL HIGH (ref 0.44–1.00)
Creatinine, Ser: 1.61 mg/dL — ABNORMAL HIGH (ref 0.44–1.00)
GFR, Estimated: 39 mL/min — ABNORMAL LOW (ref >60)
GFR, Estimated: 36 mL/min — ABNORMAL LOW (ref >60)
Glucose, Bld: 229 mg/dL — ABNORMAL HIGH (ref 70–99)
Glucose, Bld: 212 mg/dL — ABNORMAL HIGH (ref 70–99)
Phosphorus: 1.5 mg/dL — ABNORMAL LOW (ref 2.5–4.6)
Phosphorus: 1.5 mg/dL — ABNORMAL LOW (ref 2.5–4.6)
Potassium: 4.1 mmol/L (ref 3.5–5.1)
Potassium: 3.9 mmol/L (ref 3.5–5.1)
Sodium: 134 mmol/L — ABNORMAL LOW (ref 135–145)
Sodium: 135 mmol/L (ref 135–145)

## 2021-09-16 LAB — MAGNESIUM: Magnesium: 2.6 mg/dL — ABNORMAL HIGH (ref 1.7–2.4)

## 2021-09-16 LAB — GLUCOSE, CAPILLARY
Glucose-Capillary: 161 mg/dL — ABNORMAL HIGH (ref 70–99)
Glucose-Capillary: 185 mg/dL — ABNORMAL HIGH (ref 70–99)
Glucose-Capillary: 193 mg/dL — ABNORMAL HIGH (ref 70–99)
Glucose-Capillary: 223 mg/dL — ABNORMAL HIGH (ref 70–99)

## 2021-09-16 LAB — HEPATIC FUNCTION PANEL
ALT: 1065 U/L — ABNORMAL HIGH (ref 0–44)
AST: 1407 U/L — ABNORMAL HIGH (ref 15–41)
Albumin: 3.3 g/dL — ABNORMAL LOW (ref 3.5–5.0)
Alkaline Phosphatase: 245 U/L — ABNORMAL HIGH (ref 38–126)
Bilirubin, Direct: 0.5 mg/dL — ABNORMAL HIGH (ref 0.0–0.2)
Indirect Bilirubin: 1 mg/dL — ABNORMAL HIGH (ref 0.3–0.9)
Total Bilirubin: 1.5 mg/dL — ABNORMAL HIGH (ref 0.3–1.2)
Total Protein: 6.8 g/dL (ref 6.5–8.1)

## 2021-09-16 LAB — APTT: aPTT: 26 seconds (ref 24–36)

## 2021-09-16 MED ORDER — HYDROMORPHONE HCL 1 MG/ML IJ SOLN
0.5000 mg | INTRAMUSCULAR | Status: DC | PRN
Start: 2021-09-16 — End: 2021-09-17
  Administered 2021-09-17: 0.5 mg via INTRAVENOUS
  Filled 2021-09-16: qty 1

## 2021-09-16 NOTE — Progress Notes (Signed)
° °  NAME:  Donna Howe, MRN:  [id number], DOB:  04-03-1956, LOS: 8 ADMISSION DATE:  09/08/2021, CONSULTATION DATE:  09/12/2021 REFERRING MD:  Dr. Harrington Challenger, CHIEF COMPLAINT: Hypotension with hypoxia  History of Present Illness:  Donna Howe 65 year old female with an extensive past medical history status post CABG in 2005 with multiple PCI's since, multiple NSTEMI's and has been deemed too high risk for CABG re-do. HTN. HLD, type 2 diabetes, and ESRD on HD MWF who presented to the emergency department with complaints of recurrent chest pain.  Patient reports several episodes of substernal chest pain unrelieved by nitroglycerin (utilized and entire bottle prior to admission and 1 week)  Cardiology evaluated patient on admission and plan was made to repeat heart catheterization.  This was performed 12/22 and revealed three-vessel occlusive CAD.  Patient was taken back for heart catheterization 12/23 for PCI and balloon angioplasty was performed to left main.   Critical care consulted 12/26 due to persistent severe hypertension  Pertinent  Medical History  Prior CABG in 2005 with multiple PCI's since, multiple NSTEMI's and has been deemed too high risk for CABG re-do. HTN. HLD, type 2 diabetes, and ESRD on HD MWF  Significant Hospital Events: Including procedures, antibiotic start and stop dates in addition to other pertinent events   12/22 admitted with complaints of chest pain underwent diagnostic left heart cath which revealed severe three-vessel occlusive disease 12/23 underwent balloon angioplasty to left main 12/26 developed significant hypotension resulting in transfer ICU for further management 12/28 transition to no escalation, awaiting family arrival  Interim History / Subjective:  No events. Appears comfortable. Not very hungry.  Objective   Blood pressure (!) 75/40, pulse 73, temperature (!) 97.5 F (36.4 C), temperature source Oral, resp. rate 16, height 5\' 5"  (1.651 m),  weight 92.6 kg, SpO2 98 %.        Intake/Output Summary (Last 24 hours) at 09/16/2021 0804 Last data filed at 09/16/2021 0700 Gross per 24 hour  Intake 1615.06 ml  Output 5924 ml  Net -4308.94 ml    Filed Weights   09/13/21 0500 09/14/21 0500 09/16/21 0600  Weight: 100.4 kg 98.5 kg 92.6 kg    Examination: More awake today Abdomen mildly tender Ext warm Aox3 but this waxes and wanes  Labs stable  Resolved Hospital Problem list     Assessment & Plan:  NSTEMI without further revascularization options Mixed cardiogenic and distributive shock Ischemic mitral failure and cardiomyopathy Congestive hepatopathy from worsening heart failure Question duodenitis vs. Contained perforation- conservative management, not operative candidate ESRD on CRRT DM2 with hypoglycemia  - See GOC discussion from 12/28 - Continue CRRT while awaiting brother (okay to stop once filter clots), no escalation of care, transition to full comfort should she have any signs of suffering or deterioration - DC oral meds since not tolerating  Erskine Emery MD [facility]  Prefer epic messenger for cross cover needs If after hours, please call E-link

## 2021-09-16 NOTE — Plan of Care (Signed)
°  Problem: Clinical Measurements: Goal: Respiratory complications will improve Outcome: Progressing   Problem: Pain Managment: Goal: General experience of comfort will improve Outcome: Progressing   Problem: Coping: Goal: Level of anxiety will decrease Outcome: Progressing

## 2021-09-16 NOTE — Progress Notes (Signed)
Palliative Medicine Inpatient Follow Up Note  Reason for Consultation/Follow-up: Establishing goals of care   HPI/Patient Profile:  65 y.o. female  with past medical history of  status post CABG in 2005 with multiple PCI's since, multiple NSTEMI's and has been deemed too high risk for CABG re-do. HTN. HLD, type 2 diabetes, and ESRD on HD MWF who presented to the emergency department with complaints of recurrent chest pain.  Patient reports several episodes of substernal chest pain unrelieved by nitroglycerin (utilized and entire bottle prior to admission and 1 week). Cardiology evaluated patient on admission and plan was made to repeat heart catheterization.  This was performed 12/22 and revealed three-vessel occlusive CAD.  Patient was taken back for heart catheterization 12/23 for PCI and balloon angioplasty was performed to left main.    Critical care consulted 12/26 due to persistent severe hypertension.    Patient now being treated for NSTEMI without further revascularization options, mixed cardiogenic and distributive shock, ischemic and valvular cardiomyopathy, duodenitis versus contained gastric perforation, ESRD on HD, and others.   PMT was consulted for goals of care conversations.  Today's Discussion (09/16/2021):  *Please note that this is a verbal dictation therefore any spelling or grammatical errors are due to the "[place] [employer]" system interpretation.  Chart reviewed.  I met with Donna Howe at bedside this morning.  She was more alert I provided insight as to who I am and what I do on the palliative care team.  She shares with me that today they are going to "cut off" the machines.  I asked her to elaborate on that and she expressed that there is a plan for the medical team to stop the present interventions.  She asked me what that would mean.  I shared with her that that would mean her life on earth will be quite short and our goal would be to make her comfortable during that  time.  She expresses grief at this reality and shares that she still wants to be present for her grandchildren.  Patient was incrementally confused throughout this time.  Support provided through therapeutic listening.  I was able to call patient's son, Donna Howe after having met with Adonis Huguenin. He shares that he is aware of the plan to transition her focus to comfort oriented care after Donna Howe's brother has the opportunity to visit from [place].  Donna Howe states that her brother should be here early morning.  Donna Howe also expresses he plans to be here tomorrow and we agreed to meet.  Donna Howe denies additional questions or concerns at this time.  Patient's RN, Benjamine Mola updated on the plan.  Palliative support provided.  Objective Assessment: Vital Signs Vitals:   09/16/21 1400 09/16/21 1500  BP: (!) 78/31 (!) 74/34  Pulse: 70 69  Resp: 20 11  Temp:    SpO2: 93% 98%    Intake/Output Summary (Last 24 hours) at 09/16/2021 1608 Last data filed at 09/16/2021 1500 Gross per 24 hour  Intake 1533.12 ml  Output 5317 ml  Net -3783.88 ml   Last Weight  Most recent update: 09/16/2021  6:44 AM    Weight  92.6 kg (204 lb 2.3 oz)            Physical Exam Vitals and nursing note reviewed.  Constitutional:      General: She is sleeping. She is not in acute distress.    Appearance: She is ill-appearing.  HENT:     Head: Normocephalic and atraumatic.  Cardiovascular:  Rate and Rhythm: Normal rate.  Pulmonary:     Effort: Pulmonary effort is normal. No respiratory distress.     Breath sounds: No wheezing or rhonchi.  Abdominal:     General: Abdomen is flat.     Palpations: Abdomen is soft.  Skin:    General: Skin is warm and dry.  Neurological:     Mental Status: She is lethargic.   SUMMARY OF RECOMMENDATIONS   DNR Continue current treatments Do not escalate care Allow time for brother to visit from [place] -he should arrive tomorrow morning after which time transition to  comfort care In the event of decompensation, switch to comfort focus PMT will continue to follow  Time Spent: 32 Greater than 50% of the time was spent in counseling and coordination of care ______________________________________________________________________________________ [place] Team Team Cell Phone: [phone] Please utilize secure chat with additional questions, if there is no response within 30 minutes please call the above phone number  Palliative Medicine Team providers are available by phone from 7am to 7pm daily and can be reached through the team cell phone.  Should this patient require assistance outside of these hours, please call the patient's attending physician.

## 2021-09-16 NOTE — Progress Notes (Signed)
°  [facility] Progress Note    Assessment/ Plan:   GOC - end stage heart disease, waiting now for family to arrive then will shift to comfort care.   ESRD: usual HD TTS.  Started CRRT on 12/27. Will plan to stop CRRT when current filter times out or clots.  Hypotension - cardiogenic shock.  Chest Pain/unstable angina, recent NSTEMI -per cardiology. S/p card cath 12/22. SP 12/23 PCI to left main into Lcx w/ balloon angio and shockwave therapy, first OM not salvageable (severe disease). LVEF 35-40% by 12/27 echo Anemia of Chronic Kidney Disease: Hemoglobin 8.0 MBD ckd - monitor phos. renal diet    Kelly Splinter, MD 09/16/2021, 11:41 AM   OP HD: TTS [facility]  4h 30min  93.5kg   2/2.5 bath  400/500   Hep none  RIJ TDC  - mircera 30 q4  - venofer 50 weekly  - calcitriol 0.5 ug tiw po    Subjective:   Seen in ICU   Objective:   BP (!) 88/42    Pulse 74    Temp (!) 97.5 F (36.4 C) (Oral)    Resp 13    Ht 5\' 5"  (1.651 m)    Wt 92.6 kg    SpO2 97%    BMI 33.97 kg/m   Intake/Output Summary (Last 24 hours) at 09/16/2021 1141 Last data filed at 09/16/2021 1022 Gross per 24 hour  Intake 1388.95 ml  Output 5048 ml  Net -3659.05 ml    Weight change:   Physical Exam: Gen:NAD, no ^ wob CVS:s1s2 no RG Resp:cta bl, normal wob CNO:BSJG Ext: 1+ bilat UE/ LE edema Neuro: awake, alert Dialysis access: RIJ [facility] (active access)    BMET Recent Labs  Lab 09/09/21 1806 09/10/21 0119 09/13/21 0521 09/13/21 1704 09/14/21 0433 09/14/21 1739 09/15/21 0430 09/15/21 1557 09/16/21 0445  NA 131*   < > 133* 134* 135 135 133* 135 134*  K 4.1   < > 5.6* 5.1 4.6 4.3 4.1 4.0 4.1  CL 95*   < > 97* 98 100 101 101 100 102  CO2 25   < > 21* 18* 24 24 23 24 23   GLUCOSE 244*   < > 217* 99 134* 152* 173* 188* 229*  BUN 29*   < > 45* 45* 29* 19 14 12 10   CREATININE 5.42*   < > 6.03* 5.50* 3.43* 2.36* 1.83* 1.43* 1.51*  CALCIUM 8.5*   < > 8.7* 8.9 8.5* 8.8* 8.5* 8.7* 8.8*  PHOS 5.9*   --   --  5.7* 3.4 2.2* 1.9* 1.5* 1.5*   < > = values in this interval not displayed.    CBC Recent Labs  Lab 09/11/21 0327 09/12/21 0456 09/12/21 1456 09/13/21 0521  WBC 10.2 11.0* 14.8* 14.6*  HGB 8.0* 7.9* 8.3* 8.4*  HCT 24.4* 24.8* 25.4* 26.4*  MCV 107.5* 106.9* 108.5* 108.6*  PLT 149* 153 199 168     Medications:     Chlorhexidine Gluconate Cloth  6 each Topical Daily   Chlorhexidine Gluconate Cloth  6 each Topical Q0600   fluticasone furoate-vilanterol  1 puff Inhalation Daily   heparin  5,000 Units Subcutaneous Q8H   insulin aspart  0-15 Units Subcutaneous TID WC   insulin aspart  0-5 Units Subcutaneous QHS   pantoprazole (PROTONIX) IV  40 mg Intravenous Q12H   sodium chloride flush  10-40 mL Intracatheter Q12H

## 2021-09-16 NOTE — Progress Notes (Signed)
0700 - Report received from PM RN.  All questions answered.  Safety checks performed.  All lines verified.  CRRT verified. Hand hygiene performed before/after each pt contact. 0800 - Dr. Tamala Julian rounded.  Cuff and a-line not correlating.   5597 - Nephrology rounded. [place], Palliative NP rounded.  She advised that pt is apprehensive about impending mortality. 1100 - Pt discussed frustration with the limitations of healthcare.  Pt concerned that her son might be unaware.  We discussed that he is aware of her condition.  Pt concerned about dying and not being able to help raise her grandchildren. 66 - Dr. Jonnie Finner changed UF order to "keep even".  Dr. Jonnie Finner paged for more information.  He discussed that enough volume has been pulled from pt.  He further advised that CRRT be D/C'd at shift change if it hasn't clotted of by then or the filter expired.  Dr. Tamala Julian updated.  Order placed in chart by Dr. Jonnie Finner. 1315 - Pt requested "something to eat".  Dr. Tamala Julian notified.  He advised to see how pt does with liquids.  Pt given beef broth.  Pt drank a small amount and fell asleep holding the cup.  Pt tolerated both broth and ice without coughing.  Dr. Tamala Julian notified. 1500 - CRRT filter due to be changed.  CRRT D/C'd per order.  Dr. Jonnie Finner and Dr. Tamala Julian notified via secure chat.  HD cath ports cleaned, flushed with Heparin dose indicated on each port, then capped. 45 - Pt's cousin arrived for a visit. 1800 - Cuff pressures turned off, per Dr. Tamala Julian.  No correlation with ABP.  Order placed.  SpO2 dipped into high 80s.  Pt placed back on O2. 1830 - Bladder Scan performed d/t no UOP.  It showed 0 mL. 1900 - Report given to PM RN.  All questions answered.

## 2021-09-17 DIAGNOSIS — Z515 Encounter for palliative care: Secondary | ICD-10-CM | POA: Diagnosis not present

## 2021-09-17 DIAGNOSIS — E1122 Type 2 diabetes mellitus with diabetic chronic kidney disease: Secondary | ICD-10-CM | POA: Diagnosis not present

## 2021-09-17 DIAGNOSIS — N186 End stage renal disease: Secondary | ICD-10-CM | POA: Diagnosis not present

## 2021-09-17 DIAGNOSIS — I200 Unstable angina: Secondary | ICD-10-CM | POA: Diagnosis not present

## 2021-09-17 DIAGNOSIS — Z992 Dependence on renal dialysis: Secondary | ICD-10-CM | POA: Diagnosis not present

## 2021-09-17 DIAGNOSIS — Z7189 Other specified counseling: Secondary | ICD-10-CM | POA: Diagnosis not present

## 2021-09-17 LAB — HEPATIC FUNCTION PANEL
ALT: 648 U/L — ABNORMAL HIGH (ref 0–44)
AST: 344 U/L — ABNORMAL HIGH (ref 15–41)
Albumin: 2.9 g/dL — ABNORMAL LOW (ref 3.5–5.0)
Alkaline Phosphatase: 171 U/L — ABNORMAL HIGH (ref 38–126)
Bilirubin, Direct: 0.3 mg/dL — ABNORMAL HIGH (ref 0.0–0.2)
Indirect Bilirubin: 0.8 mg/dL (ref 0.3–0.9)
Total Bilirubin: 1.1 mg/dL (ref 0.3–1.2)
Total Protein: 6 g/dL — ABNORMAL LOW (ref 6.5–8.1)

## 2021-09-17 LAB — GLUCOSE, CAPILLARY
Glucose-Capillary: 275 mg/dL — ABNORMAL HIGH (ref 70–99)
Glucose-Capillary: 144 mg/dL — ABNORMAL HIGH (ref 70–99)

## 2021-09-17 MED ORDER — BIOTENE DRY MOUTH MT LIQD: 15.0000 mL | OROMUCOSAL | Status: DC | PRN

## 2021-09-17 MED ORDER — GLYCOPYRROLATE 0.2 MG/ML IJ SOLN: 0.2000 mg | INTRAMUSCULAR | Status: DC | PRN

## 2021-09-17 MED ORDER — GLYCOPYRROLATE 1 MG PO TABS
1.0000 mg | ORAL_TABLET | ORAL | Status: DC | PRN
  Filled 2021-09-17: qty 1

## 2021-09-17 MED ORDER — LORAZEPAM 2 MG/ML IJ SOLN: 0.5000 mg | INTRAMUSCULAR | Status: DC | PRN

## 2021-09-17 MED ORDER — GLYCOPYRROLATE 0.2 MG/ML IJ SOLN
0.2000 mg | INTRAMUSCULAR | Status: DC | PRN
  Administered 2021-09-21: 0.2 mg via INTRAVENOUS
  Filled 2021-09-17: qty 1

## 2021-09-17 MED ORDER — HYDROMORPHONE HCL 1 MG/ML IJ SOLN
0.5000 mg | INTRAMUSCULAR | Status: DC | PRN
Start: 2021-09-17 — End: 2021-09-21
  Administered 2021-09-17: 0.5 mg via INTRAVENOUS
  Administered 2021-09-18: 1 mg via INTRAVENOUS
  Administered 2021-09-18: 0.5 mg via INTRAVENOUS
  Administered 2021-09-19 – 2021-09-20 (×4): 1 mg via INTRAVENOUS
  Filled 2021-09-17 (×9): qty 1

## 2021-09-17 MED ORDER — POLYVINYL ALCOHOL 1.4 % OP SOLN
1.0000 [drp] | Freq: Four times a day (QID) | OPHTHALMIC | Status: DC | PRN
  Filled 2021-09-17: qty 15

## 2021-09-17 NOTE — Progress Notes (Signed)
Palliative Medicine Inpatient Follow Up Note  Reason for Consultation/Follow-up: Establishing goals of care   HPI/Patient Profile:  65 y.o. female  with past medical history of  status post CABG in 2005 with multiple PCI's since, multiple NSTEMI's and has been deemed too high risk for CABG re-do. HTN. HLD, type 2 diabetes, and ESRD on HD MWF who presented to the emergency department with complaints of recurrent chest pain.  Patient reports several episodes of substernal chest pain unrelieved by nitroglycerin (utilized and entire bottle prior to admission and 1 week). Cardiology evaluated patient on admission and plan was made to repeat heart catheterization.  This was performed 12/22 and revealed three-vessel occlusive CAD.  Patient was taken back for heart catheterization 12/23 for PCI and balloon angioplasty was performed to left main.    Critical care consulted 12/26 due to persistent severe hypertension.    Patient now being treated for NSTEMI without further revascularization options, mixed cardiogenic and distributive shock, ischemic and valvular cardiomyopathy, duodenitis versus contained gastric perforation, ESRD on HD, and others.   PMT was consulted for goals of care conversations.  Today's Discussion (09/17/2021):  *Please note that this is a verbal dictation therefore any spelling or grammatical errors are due to the "[place] One" system interpretation.  Chart reviewed.  Patient's RN, Donna Howe shares that patient brother and son are at bedside.  I went to bedside and met with patients family. We reviewed the plan for transition to comfort focused care. We talked about transition to comfort measures in house and what that would entail inclusive of medications to control pain, dyspnea, agitation, nausea, itching, and hiccups.  We discussed stopping all uneccessary measures such as cardiac monitoring,  blood draws, needle sticks, and frequent vital signs.   Reviewed the  process of death and dying in the setting of kidney failure. Discussed that Donna Howe will become more sleepy and get to a point where she is not longer responsive after which her organs will shut down.   Donna Howe is tearful sharing that she does not wish to die. Support provided through therapeutic listening.  Further reviewed that patient will either continue in house or transition to an IP hospice depending on symptoms and rapidity of progress related to active death and dying.  Patient family asked if we complete financial WILL and testaments in house, I shared that we do not though they are welcome to complete these documents and have an outside notary come in for completion. Information on this provided.   Palliative support provided.  Questions and concerns answered.  Objective Assessment: Vital Signs Vitals:   09/17/21 1300 09/17/21 1400  BP:    Pulse: 80 86  Resp: 12 16  Temp:    SpO2: 100% 97%    Intake/Output Summary (Last 24 hours) at 09/17/2021 1612 Last data filed at 09/17/2021 1400 Gross per 24 hour  Intake 1193.62 ml  Output --  Net 1193.62 ml    Last Weight  Most recent update: 09/17/2021  6:04 AM    Weight  87.8 kg (193 lb 9 oz)            Physical Exam Vitals and nursing note reviewed.  Constitutional:      General: She is sleeping. She is not in acute distress.    Appearance: She is ill-appearing.  HENT:     Head: Normocephalic and atraumatic.  Cardiovascular:     Rate and Rhythm: Normal rate.  Pulmonary:     Effort: Pulmonary effort is  normal. No respiratory distress.     Breath sounds: No wheezing or rhonchi.  Abdominal:     General: Abdomen is flat.     Palpations: Abdomen is soft.  Skin:    General: Skin is warm and dry.  Neurological:     Mental Status: She is lethargic.   SUMMARY OF RECOMMENDATIONS   DNR Transition to comfort care Comfort medications per [facility] - presently no needs for a continuous drip or ATC comfort  medications Anticipate likely in hospital death versus a hospice home Ongoing palliative support  Time Spent: 37 Greater than 50% of the time was spent in counseling and coordination of care ______________________________________________________________________________________ [place] Team Team Cell Phone: [phone] Please utilize secure chat with additional questions, if there is no response within 30 minutes please call the above phone number  Palliative Medicine Team providers are available by phone from 7am to 7pm daily and can be reached through the team cell phone.  Should this patient require assistance outside of these hours, please call the patient's attending physician.

## 2021-09-17 NOTE — Progress Notes (Signed)
[facility] Progress Note  Subjective: seen in room, no c/o  Vitals:   09/17/21 0800 09/17/21 0853 09/17/21 0900 09/17/21 1000  BP:      Pulse: 81  81 78  Resp: 17  18 18   Temp:      TempSrc:      SpO2: 98% 99% 98% 100%  Weight:      Height:        Exam: Gen:NAD, no ^ wob CVS:s1s2 no RG Resp:cta bl, normal wob QBV:QXIH Ext: 1+ bilat UE/ LE edema Neuro: awake, alert Dialysis access: RIJ TDC     OP HD: TTS [facility]  4h 75min  93.5kg   2/2.5 bath  400/500   Hep none  RIJ TDC  - mircera 30 q4  - venofer 50 weekly  - calcitriol 0.5 ug tiw po  Assessment GOC - end stage heart disease, waiting now for family to arrive then will shift to comfort care.   ESRD: usual HD TTS.  Started CRRT on 12/27 and stopped on 12/30. Hypotension - cardiogenic shock.  Chest Pain/unstable angina, recent NSTEMI -per cardiology. S/p card cath 12/22. SP 12/23 PCI to left main into Lcx w/ balloon angio and shockwave therapy, first OM not salvageable (severe disease). LVEF 35-40% by 12/27 echo Anemia of Chronic Kidney Disease: Hemoglobin 8.0 MBD ckd - monitor phos. renal diet    Plan - nothing further to offer, will sign off.    Rob Yolinda Duerr 09/17/2021, 10:42 AM   Recent Labs  Lab 09/12/21 1456 09/13/21 0521 09/13/21 1704 09/16/21 0445 09/16/21 1616  K  --  5.6*   < > 4.1 3.9  BUN  --  45*   < > 10 11  CREATININE  --  6.03*   < > 1.51* 1.61*  CALCIUM  --  8.7*   < > 8.8* 9.0  PHOS  --   --    < > 1.5* 1.5*  HGB 8.3* 8.4*  --   --   --    < > = values in this interval not displayed.   Inpatient medications:  Chlorhexidine Gluconate Cloth  6 each Topical Q0600   fluticasone furoate-vilanterol  1 puff Inhalation Daily   heparin  5,000 Units Subcutaneous Q8H   insulin aspart  0-15 Units Subcutaneous TID WC   insulin aspart  0-5 Units Subcutaneous QHS   pantoprazole (PROTONIX) IV  40 mg Intravenous Q12H   sodium chloride flush  10-40 mL Intracatheter Q12H    sodium  chloride Stopped (09/17/21 0559)   cefTRIAXone (ROCEPHIN)  IV Stopped (09/16/21 1650)   metronidazole Stopped (09/17/21 0659)   norepinephrine (LEVOPHED) Adult infusion Stopped (09/14/21 1003)   promethazine (PHENERGAN) injection (IM or IVPB) Stopped (09/15/21 1613)   acetaminophen, albuterol, HYDROmorphone (DILAUDID) injection, ipratropium, LORazepam, morphine injection, nitroGLYCERIN, ondansetron (ZOFRAN) IV, ondansetron, promethazine (PHENERGAN) injection (IM or IVPB), sodium chloride flush

## 2021-09-17 NOTE — Progress Notes (Signed)
0700 - Report received from PM RN.  All questions answered.  Safety checks performed.  All lines verified. Hand hygiene performed before/after each pt contact. 0315 - Dr. Tamala Julian rounded and D/C'd D10. 0800 - Assessment & insulin. 1030 - Pt bathed, linen changed, hair washed, hair combed, and teeth brushed.  Pt requested to go to chair.  Pt assisted to chair.  Pt was weak, but able to stand and follow commands. 1130 - Pt requesting mashed potatoes.  Dr. Tamala Julian advised of same.  Dr. Martyn Malay order for regular diet and pt was ordered mashed potatoes. 12 - Pt's son arrived to visit.  He advised that he would like to wait until his uncle gets here to have a discussion about pt's status.  He was pleasantly surprised and happy to see pt in the chair and conversant.  Dr. Tamala Julian and Sharyn Lull, Palliative NP notified. 15 - Pt's daughter-In-law and DIL's mother arrived to visit. [place], Palliative NP notified that pt's brother had not shown up yet. 32 - Pt's brother arrived.  Sharyn Lull, Palliative NP and Dr. Tamala Julian notified. [place], Palliative NP arrived.  After discussion, patient and family decided to make the pt comfort care.   1830 - Pt assisted back to bed.  CVC and a-line D/C'd.  No complications. 1900 - Report given to PM RN.  All questions answered.

## 2021-09-17 NOTE — Progress Notes (Signed)
° °  NAME:  Donna Howe, MRN:  [id number], DOB:  14-Dec-1955, LOS: 9 ADMISSION DATE:  09/08/2021, CONSULTATION DATE:  09/12/2021 REFERRING MD:  Dr. Harrington Challenger, CHIEF COMPLAINT: Hypotension with hypoxia  History of Present Illness:  Donna Howe 65 year old female with an extensive past medical history status post CABG in 2005 with multiple PCI's since, multiple NSTEMI's and has been deemed too high risk for CABG re-do. HTN. HLD, type 2 diabetes, and ESRD on HD MWF who presented to the emergency department with complaints of recurrent chest pain.  Patient reports several episodes of substernal chest pain unrelieved by nitroglycerin (utilized and entire bottle prior to admission and 1 week)  Cardiology evaluated patient on admission and plan was made to repeat heart catheterization.  This was performed 12/22 and revealed three-vessel occlusive CAD.  Patient was taken back for heart catheterization 12/23 for PCI and balloon angioplasty was performed to left main.   Critical care consulted 12/26 due to persistent severe hypertension  Pertinent  Medical History  Prior CABG in 2005 with multiple PCI's since, multiple NSTEMI's and has been deemed too high risk for CABG re-do. HTN. HLD, type 2 diabetes, and ESRD on HD MWF  Significant Hospital Events: Including procedures, antibiotic start and stop dates in addition to other pertinent events   12/22 admitted with complaints of chest pain underwent diagnostic left heart cath which revealed severe three-vessel occlusive disease 12/23 underwent balloon angioplasty to left main 12/26 developed significant hypotension resulting in transfer ICU for further management 12/28 transition to no escalation, awaiting family arrival  Interim History / Subjective:  No events. Appears comfortable. Tried to eat a bit yesterday but fell asleep. Requiring PRN pain meds for abdominal pain. Brother to arrive today. CRRT off.  Objective   Blood pressure (!) 71/30,  pulse 75, temperature 98.3 F (36.8 C), temperature source Oral, resp. rate 18, height 5\' 5"  (1.651 m), weight 87.8 kg, SpO2 96 %.        Intake/Output Summary (Last 24 hours) at 09/17/2021 0736 Last data filed at 09/17/2021 0700 Gross per 24 hour  Intake 1785.48 ml  Output 1490 ml  Net 295.48 ml    Filed Weights   09/14/21 0500 09/16/21 0600 09/17/21 0600  Weight: 98.5 kg 92.6 kg 87.8 kg    Examination: More somnolent today Abdomen diffusely TTP Heart sounds regular, ext lukewarm Moves all 4 ext but weak  Shock liver looks better No BMP this am  Resolved Hospital Problem list     Assessment & Plan:  NSTEMI without further revascularization options Mixed cardiogenic and distributive shock Ischemic mitral failure and cardiomyopathy Congestive hepatopathy from worsening heart failure Question duodenitis vs. Contained perforation- conservative management, not operative candidate ESRD on CRRT DM2 with hypoglycemia  - See GOC discussion from 12/28 - Brother to come this am - DC dextrose - No escalation of care - Transition to comfort when brother has had chance to say goodbye, appreciate palliative help  Erskine Emery MD [facility]  Prefer epic messenger for cross cover needs If after hours, please call E-link

## 2021-09-18 DIAGNOSIS — Z7189 Other specified counseling: Secondary | ICD-10-CM | POA: Diagnosis not present

## 2021-09-18 DIAGNOSIS — I200 Unstable angina: Secondary | ICD-10-CM | POA: Diagnosis not present

## 2021-09-18 DIAGNOSIS — Z515 Encounter for palliative care: Secondary | ICD-10-CM | POA: Diagnosis not present

## 2021-09-18 MED ORDER — HYDROMORPHONE HCL 1 MG/ML IJ SOLN
1.0000 mg | Freq: Three times a day (TID) | INTRAMUSCULAR | Status: DC
  Administered 2021-09-18 – 2021-09-21 (×9): 1 mg via INTRAVENOUS
  Filled 2021-09-18 (×9): qty 1

## 2021-09-18 NOTE — Progress Notes (Signed)
Palliative Medicine Inpatient Follow Up Note  Reason for Consultation/Follow-up: Establishing goals of care   HPI/Patient Profile:  66 y.o. female  with past medical history of  status post CABG in 2005 with multiple PCI's since, multiple NSTEMI's and has been deemed too high risk for CABG re-do. HTN. HLD, type 2 diabetes, and ESRD on HD MWF who presented to the emergency department with complaints of recurrent chest pain.  Patient reports several episodes of substernal chest pain unrelieved by nitroglycerin (utilized and entire bottle prior to admission and 1 week). Cardiology evaluated patient on admission and plan was made to repeat heart catheterization.  This was performed 12/22 and revealed three-vessel occlusive CAD.  Patient was taken back for heart catheterization 12/23 for PCI and balloon angioplasty was performed to left main.    Critical care consulted 12/26 due to persistent severe hypertension.    Patient now being treated for NSTEMI without further revascularization options, mixed cardiogenic and distributive shock, ischemic and valvular cardiomyopathy, duodenitis versus contained gastric perforation, ESRD on HD, and others.   PMT was consulted for goals of care conversations.  Today's Discussion (09/18/2020):  *Please note that this is a verbal dictation therefore any spelling or grammatical errors are due to the "[place] One" system interpretation.  Chart reviewed.  I met with Donna Howe at bedside this afternoon. She is sitting up in the chair in NAD. She expresses feelings of "sleepiness" but otherwise is fairly well. We review her plan for comfort focused care. Discussed that she has continued to receive dilaudid incrementally for pain. Reviewed that we will initiate this ATC to better aid in symptom management.  At this time, [name] appears well enough to transition to an inpatient hospice home. She endorses wanting to be closer to her son in [place].  I referred [name]  to the [facility] team so that a referral could be made to [facility].   Palliative support provided.  Questions and concerns answered.  Objective Assessment: Vital Signs Vitals:   09/18/21 0845 09/18/21 0904  BP:  (!) 79/34  Pulse: 76 73  Resp: 14 18  Temp:  97.7 F (36.5 C)  SpO2: 95% 99%    Intake/Output Summary (Last 24 hours) at 09/18/2021 1355 Last data filed at 09/18/2021 0400 Gross per 24 hour  Intake 250 ml  Output --  Net 250 ml    Last Weight  Most recent update: 09/17/2021  6:04 AM    Weight  87.8 kg (193 lb 9 oz)            Physical Exam Vitals and nursing note reviewed.  Constitutional:      General: She is sleeping. She is not in acute distress.    Appearance: She is ill-appearing.  HENT:     Head: Normocephalic and atraumatic.  Cardiovascular:     Rate and Rhythm: Normal rate.  Pulmonary:     Effort: Pulmonary effort is normal. No respiratory distress.     Breath sounds: No wheezing or rhonchi.  Abdominal:     General: Abdomen is flat.     Palpations: Abdomen is soft.  Skin:    General: Skin is warm and dry.  Neurological:     Mental Status: She is lethargic.   SUMMARY OF RECOMMENDATIONS   DNR  comfort care Comfort medications per MAR - Dilaudid 34m IV Q8H ATC TOC - [facility]  _________________________________________ Billing:  MDM - High in the setting of  cardiogenic shock with ESRD now on comfort measures. Modifying parenteral opoid medications.  ______________________________________________________________________________________ [facility] Team Team Cell Phone: [phone] Please utilize secure chat with additional questions, if there is no response within 30 minutes please call the above phone number  Palliative Medicine Team providers are available by phone from 7am to 7pm daily and can be reached through the team cell phone.   Should this patient require assistance outside of these hours, please call the patient's attending physician.

## 2021-09-18 NOTE — TOC Initial Note (Signed)
Transition of Care [facility]) - Initial/Assessment Note    Patient Details  Name: Donna Howe MRN: [id number] Date of Birth: 09/07/1956  Transition of Care [facility]) CM/SW Contact:    Joanne Chars, LCSW Phone Number: 09/18/2021, 12:58 PM  Clinical Narrative:   CSW contacted by palliative care team regarding decision for residential hospice bed.  CSW spoke with son Larkin Ina by phone.  He confirms this decision.  Dicussed choice options with hospice agencies, he lives in [place], first choice would be [facility] of [facility] will make referral to [facility]osp[facility] or [facility] spoke with on call service at [facility] and made referral.  They are closed tomorrow, referral will go to on call worker.                  Expected Discharge Plan: Hospice Medical Facility Barriers to Discharge: Other (must enter comment) (pending hospice eligibility and residential bed)   Patient Goals and CMS Choice        Expected Discharge Plan and Services Expected Discharge Plan: [place] Choice: Residential Hospice Bed Living arrangements for the past 2 months: Single Family Home                                      Prior Living Arrangements/Services Living arrangements for the past 2 months: Single Family Home Lives with:: Self Patient language and need for interpreter reviewed:: No        Need for Family Participation in Patient Care: Yes (Comment) Care giver support system in place?: Yes (comment) Current home services: Other (comment) (none) Criminal Activity/Legal Involvement Pertinent to Current Situation/Hospitalization: No - Comment as needed  Activities of Daily Living Home Assistive Devices/Equipment: None ADL Screening (condition at time of admission) Patient's cognitive ability adequate to safely complete daily activities?: Yes Is the patient deaf or have difficulty hearing?: No Does the patient have difficulty seeing,  even when wearing glasses/contacts?: No Does the patient have difficulty concentrating, remembering, or making decisions?: No Patient able to express need for assistance with ADLs?: Yes Does the patient have difficulty dressing or bathing?: No Independently performs ADLs?: Yes (appropriate for developmental age) Does the patient have difficulty walking or climbing stairs?: No Weakness of Legs: None Weakness of Arms/Hands: None  Permission Sought/Granted                  Emotional Assessment              Admission diagnosis:  Unstable angina ([place]) [I20.0] Chest pain, unspecified type [R07.9] Patient Active Problem List   Diagnosis Date Noted   Pressure injury of skin 09/15/2021   NSTEMI (non-ST elevated myocardial infarction) ([place]) 08/26/2021   COPD (chronic obstructive pulmonary disease) ([place]) 07/13/2021   Insulin dependent type 2 diabetes mellitus ([place])    Acute respiratory failure ([place]) 07/12/2021   Nausea 07/02/2021   Pulmonary edema 06/14/2021   Other fluid overload 03/01/2021   Stenosis of left subclavian artery (HCC)    Non-ST elevation (NSTEMI) myocardial infarction ([place]) 01/12/2021   Leukocytosis 01/09/2021   Elevated MCV 01/09/2021   Diabetic neuropathy ([place]) 01/09/2021   COVID-19 09/30/2020   Unspecified protein-calorie malnutrition ([place]) 07/05/2020   Allergy, unspecified, initial encounter 06/30/2020   Anaphylactic shock, unspecified, initial encounter 06/30/2020   Anemia in chronic kidney disease 06/30/2020   Coagulation defect, unspecified (  [place]) 06/30/2020   Diarrhea, unspecified 06/30/2020   Encounter for immunization 06/30/2020   Iron deficiency anemia, unspecified 06/30/2020   Pain, unspecified 06/30/2020   PAD (peripheral artery disease) ([place]) 06/30/2020   Pruritus, unspecified 06/30/2020   Secondary hyperparathyroidism of renal origin ([place]) 06/30/2020   ESRD (end stage renal disease) (HCC)    SOB (shortness of breath)    HFmrEF (heart failure  with mildly reduced EF)    GERD (gastroesophageal reflux disease)    Acute renal failure superimposed on stage 5 chronic kidney disease, not on chronic dialysis ([place]) 06/13/2020   Hyperglycemia due to diabetes mellitus ([place]) 06/13/2020   Pneumonia 12/17/2019   Cough variant asthma with component of UACS 11/20/2019   Elevated troponin I level 10/02/2019   Chest pain 10/01/2019   Unstable angina ([place]) 04/18/2018   Hypothyroidism (acquired) 04/18/2018   S/P angioplasty with stent- DES to [facility] and to LIMA to LAD with DES 04/09/18.   04/10/2018   Angina pectoris ([place]) 04/05/2018   Status post coronary artery stent placement    Acute coronary syndrome ([place]) 05/31/2017   Acute chest pain    History of coronary artery disease    Diabetes mellitus with ESRD (end-stage renal disease) ([place]) 10/28/2013   Hypoglycemia associated with diabetes ([place]) 10/28/2013   Thrombocytopenia, unspecified ([place]) 10/28/2013   Hypokalemia 10/27/2013   Syncope 10/26/2013   Anemia 10/26/2013   AKI (acute kidney injury) ([place]) 10/26/2013   Fracture of toe of right foot 17/51/0258   Umbilical hernia    Carotid artery disease ([name])    Occlusion and stenosis of carotid artery without mention of cerebral infarction 04/15/2013   Hx of CABG    Ejection fraction    Atherosclerosis of native artery of extremity with intermittent claudication ([place]) 02/11/2013   Diabetes mellitus with renal manifestation ([place]) 01/21/2013   Bradycardia 01/03/2013   Chronic kidney disease (CKD), stage III (moderate) (HCC)    Gout    PROTEINURIA, MILD 01/18/2010   Obesity (BMI 30-39.9) 08/26/2009   Asthma, chronic, unspecified asthma severity, with acute exacerbation 08/26/2009   Mixed hyperlipidemia 03/31/2007   Chronic hypotension 03/31/2007   CAD (coronary artery disease) 03/31/2007   PCP:  Curlene Labrum, MD Pharmacy:   [facility] [address] [place] [address] [place]  [address] Phone: [phone] Fax: [phone]  [facility] [place], [place] - [address] [place] [address] Phone: [phone] Fax: [phone]  [facility] [place], [place] - [place] [address] Phone: [phone] Fax: [phone]     Social Determinants of Health (SDOH) Interventions    Readmission Risk Interventions Readmission Risk Prevention Plan 08/26/2021 07/14/2021 06/15/2021  Transportation Screening Complete Complete Complete  Medication Review ([place]) Complete Complete -  PCP or Specialist appointment within 3-5 days of discharge Complete - Complete  HRI or Home Care Consult Complete Complete Complete  SW Recovery Care/Counseling Consult Complete Complete Complete  Palliative Care Screening Not Applicable Not Applicable Not Applicable  Skilled Nursing Facility Not Applicable Not Applicable Not Applicable  Some recent data might be hidden

## 2021-09-18 NOTE — Plan of Care (Signed)
  Problem: Nutrition: Goal: Adequate nutrition will be maintained Outcome: Progressing   Problem: Pain Managment: Goal: General experience of comfort will improve Outcome: Progressing   

## 2021-09-18 NOTE — Progress Notes (Signed)
0700 - Report received from PM RN.  All questions answered.  Safety checks performed.   Hand hygiene performed before/after each pt contact. 0730 - Dr. Tamala Julian rounded. 0800 - Report called to Israa, 6N RN.  All questions answered. 0845 - Pt transferred.

## 2021-09-18 NOTE — Progress Notes (Signed)
° °  NAME:  Donna Howe, MRN:  [id number], DOB:  [birth year], LOS: 14 ADMISSION DATE:  09/08/2021, CONSULTATION DATE:  09/12/2021 REFERRING MD:  Dr. Harrington Challenger, CHIEF COMPLAINT: Hypotension with hypoxia  History of Present Illness:  Donna Howe 66 year old female with an extensive past medical history status post CABG in 2005 with multiple PCI's since, multiple NSTEMI's and has been deemed too high risk for CABG re-do. HTN. HLD, type 2 diabetes, and ESRD on HD MWF who presented to the emergency department with complaints of recurrent chest pain.  Patient reports several episodes of substernal chest pain unrelieved by nitroglycerin (utilized and entire bottle prior to admission and 1 week)  Cardiology evaluated patient on admission and plan was made to repeat heart catheterization.  This was performed 12/22 and revealed three-vessel occlusive CAD.  Patient was taken back for heart catheterization 12/23 for PCI and balloon angioplasty was performed to left main.   Critical care consulted 12/26 due to persistent severe hypertension  Pertinent  Medical History  Prior CABG in 2005 with multiple PCI's since, multiple NSTEMI's and has been deemed too high risk for CABG re-do. HTN. HLD, type 2 diabetes, and ESRD on HD MWF  Significant Hospital Events: Including procedures, antibiotic start and stop dates in addition to other pertinent events   12/22 admitted with complaints of chest pain underwent diagnostic left heart cath which revealed severe three-vessel occlusive disease 12/23 underwent balloon angioplasty to left main 12/26 developed significant hypotension resulting in transfer ICU for further management 12/28 transition to no escalation, awaiting family arrival 12/31 transition to full comfort  Interim History / Subjective:  No events. Ongoing back and abdominal pain relieved with PRNs.  Objective   Blood pressure (!) 71/30, pulse 82, temperature (!) 97.3 F (36.3 C), resp. rate 15,  height 5\' 5"  (1.651 m), weight 87.8 kg, SpO2 94 %.        Intake/Output Summary (Last 24 hours) at 09/18/2021 0751 Last data filed at 09/18/2021 0400 Gross per 24 hour  Intake 445.86 ml  Output --  Net 445.86 ml    Filed Weights   09/14/21 0500 09/16/21 0600 09/17/21 0600  Weight: 98.5 kg 92.6 kg 87.8 kg    Examination: No distress Abdomen mildly tender AOx3  Resolved Hospital Problem list     Assessment & Plan:  Cardiogenic shock from ACS and ischemic mitral valve- no treatment options Question duodenitis vs. Contained perforation- conservative management, not operative candidate ESRD DM2 with hypoglycemia  Full comfort, PRNs as ordered Transfer to 6N for ongoing care Will either pass here or need transition to inpatient hospice, will see how does over next couple days, appreciate palliative help Appreciate TRH taking over starting 09/19/21  Erskine Emery MD [facility]  Prefer epic messenger for cross cover needs If after hours, please call E-link

## 2021-09-19 DIAGNOSIS — Z7189 Other specified counseling: Secondary | ICD-10-CM | POA: Diagnosis not present

## 2021-09-19 DIAGNOSIS — R079 Chest pain, unspecified: Secondary | ICD-10-CM | POA: Diagnosis not present

## 2021-09-19 DIAGNOSIS — N186 End stage renal disease: Secondary | ICD-10-CM | POA: Diagnosis not present

## 2021-09-19 DIAGNOSIS — I200 Unstable angina: Secondary | ICD-10-CM | POA: Diagnosis not present

## 2021-09-19 DIAGNOSIS — E1122 Type 2 diabetes mellitus with diabetic chronic kidney disease: Secondary | ICD-10-CM | POA: Diagnosis not present

## 2021-09-19 DIAGNOSIS — E669 Obesity, unspecified: Secondary | ICD-10-CM

## 2021-09-19 DIAGNOSIS — I25110 Atherosclerotic heart disease of native coronary artery with unstable angina pectoris: Secondary | ICD-10-CM | POA: Diagnosis not present

## 2021-09-19 DIAGNOSIS — I5041 Acute combined systolic (congestive) and diastolic (congestive) heart failure: Secondary | ICD-10-CM

## 2021-09-19 DIAGNOSIS — Z515 Encounter for palliative care: Secondary | ICD-10-CM | POA: Diagnosis not present

## 2021-09-19 DIAGNOSIS — E8809 Other disorders of plasma-protein metabolism, not elsewhere classified: Secondary | ICD-10-CM

## 2021-09-19 NOTE — Progress Notes (Signed)
PROGRESS NOTE    Donna Howe  GEZ:[id number] DOB: 08/02/1956 DOA: 09/08/2021 PCP: Curlene Labrum, MD   Brief Narrative:  Patient is a 66 year old obese Caucasian female with an extensive past medical history significant for but not limited to CAD status post CABG in 2005 with multiple PCI's since, multiple NSTEMI's as well as other comorbidities who was deemed too high risk for CABG redo, hypertension, hyperlipidemia, diabetes mellitus type 2, history of ESRD on hemodialysis Monday Wednesday Friday as well as other comorbidities who presented to the ED with recurrent chest pain.  She reports several episodes of substernal chest pain that was unrelieved by nitroglycerin as she utilized entire bottle prior to mission within 1 week.  Cardiology evaluated the patient on admission and plan was to repeat her heart catheterization.  This was performed on 09/08/2021 and this revealed three-vessel occlusive CAD.  She was taken back for heart catheterization on 09/09/2021 for PCI balloon angioplasty that was performed the left main artery.  Subsequently since then critical care was consulted due to persistent severe hypotension with associated hypoxia.  She was transferred to the ICU for further management.  09/14/2021 there is no escalation in care and given her multiple comorbidities after palliative care discussion she is transition to full comfort given that she was in cardiogenic shock from ACS and had an ischemic mitral valve with no treatment options.  She also had abdominal complaints and there was questionable duodenitis versus a contained perforation and recommended conservative management as she is not a operative candidate.  She was transitioned to the [facility] service on 09/19/2021 and she is on full comfort measures and will transition to the [address] for ongoing care.  Given high risk for further decompensation palliative care has been assisting and we are attempting to get her to residential hospice at  [place]:   Principal Problem:   Unstable angina ([place]) Active Problems:   Mixed hyperlipidemia   Obesity (BMI 30-39.9)   CAD (coronary artery disease)   Hx of CABG   Diabetes mellitus with ESRD (end-stage renal disease) ([place])   ESRD (end stage renal disease) (H[facility])   Pressure injury of skin  Chest pain with unstable angina and recent NSTEMI -She is status post cardiac catheterization and PCI to left main into left circumflex with balloon angioplasty and shockwave therapy.  Her first OM is not salvageable given her severe disease and her LVEF was 35 to 40% on echo -Cardiology felt she had no further revascularization options options and she subsequently went into cardiogenic shock with ischemic mitral valve  ESRD on hemodialysis Monday Wednesday Friday Tuesdays, Thursdays, Saturday -She is initiated on CRRT on 09/13/2021 but this was stopped given her cardiogenic shock -She is transition to full comfort measures and awaiting residential hospice placement  Anemia of chronic kidney disease -Will not check any more hemoglobin given that she is now full comfort measures  Hypotension in the setting of cardiogenic shock and distributive shock Ischemic mitral valve failure and cardiomyopathy -No treatment options and will remain for comfort  Congestive hepatopathy from worsening heart failure -LFT started trending down on last check but will not check any more -We will check liver numbers given that she is full comfort care high risk for further decompensation  Diabetes mellitus type 2 with hyperglycemia -Will not check blood sugars given that she is now full comfort care  Hypoalbuminemia -Last checked albumin was 2.9  Hypophosphatemia -Last phosphorus level was 1.5 and will not  replete given that she is now full comfort measures  Obesity -Complicates overall prognosis and care -Estimated body mass index is 32.21 kg/m as calculated from the following:    Height as of this encounter: 5\' 5"  (1.651 m).   Weight as of this encounter: 87.8 kg.  -Weight Loss and Dietary Counseling given  All medications not in the patient's comfort have been discontinued with a goal to try to keep her as comfortable as possible.  She has acetaminophen, albuterol, Biotene antiseptic oral rinse, Breo Ellipta, glycopyrrolate for secretions, hydromorphone scheduled and as needed, ipratropium, lorazepam, ondansetron, polyvinyl ophthalmic solutions as well as antiemetics with promethazine  DVT prophylaxis: None Code Status: DO NOT RESUSCITATE Family Communication: Discussed with brother at bedside Disposition Plan: Anticipating transitioning to residential hospice if she gets to bed but if she does not get a bed soon she is at risk for further decompensation and hospital  Status is: Inpatient  Remains inpatient appropriate because: Currently awaiting residential hospice bed  Consultants:  Cardiology Nephrology PCCM Transfer  Procedures:  12/22 admitted with complaints of chest pain underwent diagnostic left heart cath which revealed severe three-vessel occlusive disease 12/23 underwent balloon angioplasty to left main 12/26 developed significant hypotension resulting in transfer ICU for further management 12/28 transition to no escalation, awaiting family arrival   Antimicrobials:  Anti-infectives (From admission, onward)    Start     Dose/Rate Route Frequency Ordered Stop   09/12/21 1715  cefTRIAXone (ROCEPHIN) 2 g in sodium chloride 0.9 % 100 mL IVPB  Status:  Discontinued        2 g 200 mL/hr over 30 Minutes Intravenous Every 24 hours 09/12/21 1619 09/17/21 1611   09/12/21 1715  metroNIDAZOLE (FLAGYL) IVPB 500 mg  Status:  Discontinued        500 mg 100 mL/hr over 60 Minutes Intravenous Every 12 hours 09/12/21 1619 09/17/21 1611        Subjective: Seen and examined at bedside and she is complaining about some abdominal discomfort.  No nausea or  vomiting.  Felt okay.  States that she had some chest discomfort and tremors.  No other concerns or complaints at this time.  Objective: Vitals:   09/18/21 0845 09/18/21 0904 09/18/21 1559 09/19/21 0448  BP:  (!) 79/34 (!) 75/28 (!) 83/39  Pulse: 76 73 72 84  Resp: 14 18 19 18   Temp:  97.7 F (36.5 C) 98.1 F (36.7 C) 99.6 F (37.6 C)  TempSrc:   Oral Oral  SpO2: 95% 99%  90%  Weight:      Height:       No intake or output data in the 24 hours ending 09/19/21 1905 Filed Weights   09/14/21 0500 09/16/21 0600 09/17/21 0600  Weight: 98.5 kg 92.6 kg 87.8 kg   Examination: Physical Exam:  Constitutional: Chronically ill-appearing Caucasian female sitting in the chair at bedside in no acute distress appears slightly uncomfortable Eyes: Lids and conjunctivae normal, sclerae anicteric  ENMT: External Ears, Nose appear normal. Grossly normal hearing. Neck: Appears normal, supple, no cervical masses, normal ROM, no appreciable thyromegaly; no appreciable JVD Respiratory: Diminished to auscultation bilaterally, no wheezing, rales, rhonchi or crackles. Normal respiratory effort and patient is not tachypenic. No accessory muscle use.  Cardiovascular: RRR, no murmurs / rubs / gallops. S1 and S2 auscultated.  Has 1+ extremity edema Abdomen: Soft, tender to palpate, distended secondary body habitus. Bowel sounds positive.  GU: Deferred. Musculoskeletal: No clubbing / cyanosis of digits/nails. No joint deformity  upper and lower extremities.  Skin: No rashes, lesions, ulcers on limited skin evaluation. No induration; Warm and dry.  Neurologic: CN 2-12 grossly intact with no focal deficits. Romberg sign and cerebellar reflexes not assessed.  Psychiatric: Normal judgment and insight. Alert and oriented x 3.  Depressed appearing mood and slightly flat affect   Data Reviewed: I have personally reviewed following labs and imaging studies  CBC: Recent Labs  Lab 09/13/21 0521  WBC 14.6*  HGB 8.4*   HCT 26.4*  MCV 108.6*  PLT 315   Basic Metabolic Panel: Recent Labs  Lab 09/14/21 0433 09/14/21 1739 09/15/21 0430 09/15/21 1557 09/16/21 0445 09/16/21 1616  NA 135 135 133* 135 134* 135  K 4.6 4.3 4.1 4.0 4.1 3.9  CL 100 101 101 100 102 99  CO2 24 24 23 24 23 24   GLUCOSE 134* 152* 173* 188* 229* 212*  BUN 29* 19 14 12 10 11   CREATININE 3.43* 2.36* 1.83* 1.43* 1.51* 1.61*  CALCIUM 8.5* 8.8* 8.5* 8.7* 8.8* 9.0  MG 2.4  --  2.4  --  2.6*  --   PHOS 3.4 2.2* 1.9* 1.5* 1.5* 1.5*   GFR: Estimated Creatinine Clearance: 39.1 mL/min (A) (by C-G formula based on SCr of 1.61 mg/dL (H)). Liver Function Tests: Recent Labs  Lab 09/13/21 1114 09/13/21 1704 09/14/21 0433 09/14/21 1739 09/15/21 0430 09/15/21 1557 09/16/21 0445 09/16/21 1616 09/17/21 0410  AST 224*  --  322*  --  1,580*  --  1,407*  --  344*  ALT 156*  --  203*  --  752*  --  1,065*  --  648*  ALKPHOS 214*  --  199*  --  186*  --  245*  --  171*  BILITOT 1.3*  --  1.1  --  1.2  --  1.5*  --  1.1  PROT 5.7*  --  5.7*  --  6.0*  --  6.8  --  6.0*  ALBUMIN 2.9*   < > 3.0*   < > 2.9*   2.9* 3.1* 3.3*   3.2* 3.2* 2.9*   < > = values in this interval not displayed.   No results for input(s): LIPASE, AMYLASE in the last 168 hours. No results for input(s): AMMONIA in the last 168 hours. Coagulation Profile: No results for input(s): INR, PROTIME in the last 168 hours. Cardiac Enzymes: No results for input(s): CKTOTAL, CKMB, CKMBINDEX, TROPONINI in the last 168 hours. BNP (last 3 results) No results for input(s): PROBNP in the last 8760 hours. HbA1C: No results for input(s): HGBA1C in the last 72 hours. CBG: Recent Labs  Lab 09/16/21 1142 09/16/21 1616 09/16/21 2244 09/17/21 0708 09/17/21 1116  GLUCAP 185* 193* 223* 275* 144*   Lipid Profile: No results for input(s): CHOL, HDL, LDLCALC, TRIG, CHOLHDL, LDLDIRECT in the last 72 hours. Thyroid Function Tests: No results for input(s): TSH, T4TOTAL, FREET4,  T3FREE, THYROIDAB in the last 72 hours. Anemia Panel: No results for input(s): VITAMINB12, FOLATE, FERRITIN, TIBC, IRON, RETICCTPCT in the last 72 hours. Sepsis Labs: Recent Labs  Lab 09/13/21 1114  LATICACIDVEN 6.9*    No results found for this or any previous visit (from the past 240 hour(s)).   RN Pressure Injury Documentation: Pressure Injury 09/12/21 Thigh Anterior;Left;Proximal Stage 1 -  Intact skin with non-blanchable redness of a localized area usually over a bony prominence. skin tear to left upper groin just below belly (Active)  09/12/21 1245  Location: Thigh  Location Orientation:  Anterior;Left;Proximal  Staging: Stage 1 -  Intact skin with non-blanchable redness of a localized area usually over a bony prominence.  Wound Description (Comments): skin tear to left upper groin just below belly  Present on Admission:      Estimated body mass index is 32.21 kg/m as calculated from the following:   Height as of this encounter: 5\' 5"  (1.651 m).   Weight as of this encounter: 87.8 kg.  Malnutrition Type:   Malnutrition Characteristics:   Nutrition Interventions:   Radiology Studies: No results found.  Scheduled Meds:  Chlorhexidine Gluconate Cloth  6 each Topical Q0600   fluticasone furoate-vilanterol  1 puff Inhalation Daily    HYDROmorphone (DILAUDID) injection  1 mg Intravenous Q8H   Continuous Infusions:  promethazine (PHENERGAN) injection (IM or IVPB) 12.5 mg (09/17/21 1444)    LOS: 11 days   Kerney Elbe, DO [facility] PAGER is on [place]  If 7PM-7AM, please contact night-coverage [email or web address]

## 2021-09-19 NOTE — Plan of Care (Signed)
°  Problem: Nutrition: Goal: Adequate nutrition will be maintained 09/19/2021 1418 by Mohamed-Medani, Rebekah Sprinkle A, RN Outcome: Progressing 09/19/2021 1418 by Mohamed-Medani, Mariellen Blaney A, RN Outcome: Progressing   Problem: Safety: Goal: Ability to remain free from injury will improve Outcome: Progressing

## 2021-09-19 NOTE — Care Management Important Message (Signed)
Important Message  Patient Details  Name: Donna Howe MRN: [id number] Date of Birth: 23-May-1956   Medicare Important Message Given:  Yes     Hannah Beat 09/19/2021, 12:49 PM

## 2021-09-19 NOTE — Progress Notes (Signed)
Daily Progress Note   Patient Name: Donna Howe       Date: 09/19/2021 DOB: 11/17/1955  Age: 66 y.o. MRN#: [id number] Attending Physician: Kerney Elbe, DO Primary Care Physician: Curlene Labrum, MD Admit Date: 09/08/2021 Length of Stay: 11 days  Reason for Consultation/Follow-up: Establishing goals of care and Terminal Care  HPI/Patient Profile:  66 y.o. female  with past medical history of  status post CABG in 2005 with multiple PCI's since, multiple NSTEMI's and has been deemed too high risk for CABG re-do. HTN. HLD, type 2 diabetes, and ESRD on HD MWF who presented to the emergency department with complaints of recurrent chest pain.  Patient reports several episodes of substernal chest pain unrelieved by nitroglycerin (utilized and entire bottle prior to admission and 1 week). Cardiology evaluated patient on admission and plan was made to repeat heart catheterization.  This was performed 12/22 and revealed three-vessel occlusive CAD.  Patient was taken back for heart catheterization 12/23 for PCI and balloon angioplasty was performed to left main.    Critical care consulted 12/26 due to persistent severe hypertension.    Patient now being treated for NSTEMI without further revascularization options, mixed cardiogenic and distributive shock, ischemic and valvular cardiomyopathy, duodenitis versus contained gastric perforation, ESRD on HD, and others.   PMT was consulted for goals of care conversations.  Subjective:   Subjective: Chart Reviewed. Updates received. Patient Assessed. Created space and opportunity for patient  and family to explore thoughts and feelings regarding current medical situation.  Today's Discussion: I met with the patient at the bedside. She is sitting in the chair, NAD, seems a bit sleepy today. Vitals this morning with hypotension sats 90%. She states her lower back has been hurting from the bed, but being in the chair helps. She has ATC dilaudid for  pain. Denies dyspnea. States everyone has been very nice to her. We discussed "name of the game" at this point is comfort and happiness and she can eat, drink what she wants. Appetite has been ok thus far, was eating cheese crackers when I entered. States her favorite food is pizza and she's going to ask her son to bring her some pizza; I shared I thought this was a great idea. She expressed frustration over dropping things (likely due to worsening renal failure, sleepiness with poor heart function). No other issues or complaints today.  Plan remains d/c to [facility] residential level when bed available  Review of Systems  Constitutional:  Positive for activity change and fatigue.  Respiratory:  Negative for chest tightness and shortness of breath.   Cardiovascular:  Negative for chest pain.  Gastrointestinal:  Negative for abdominal pain, nausea and vomiting.  Neurological:  Positive for weakness.   Objective:   Vital Signs:  BP (!) 83/39 (BP Location: Left Arm)    Pulse 84    Temp 99.6 F (37.6 C) (Oral)    Resp 18    Ht _0  (1.651 m)    Wt 87.8 kg    SpO2 90%    BMI 32.21 kg/m   Physical Exam: Physical Exam Vitals and nursing note reviewed.  Constitutional:      General: She is not in acute distress. HENT:     Head: Normocephalic and atraumatic.  Cardiovascular:     Rate and Rhythm: Normal rate and regular rhythm.  Pulmonary:     Effort: Pulmonary effort is normal. No respiratory distress.  Abdominal:     General: Abdomen is  protuberant.     Palpations: Abdomen is soft.  Skin:    General: Skin is warm and dry.  Neurological:     Mental Status: She is lethargic.     Palliative Assessment/Data: 20%   Assessment & Plan:   Impression: Present on Admission:  Unstable angina (HCC)  Mixed hyperlipidemia  Obesity (BMI 30-39.9)  CAD (coronary artery disease)  Diabetes mellitus with ESRD (end-stage renal disease) ([place])  ESRD (end stage renal disease)  ([place])  SUMMARY OF RECOMMENDATIONS   DNR Continue comfort care in hospital Continue current comfort orders including ATC dilaudid 1 mg IV q 8 hr [facility]wait bed [facility] at [facility] PMT will continue to support  Code Status: DNR  Prognosis: < 2 weeks  Discharge Planning: Hospice facility  Discussed with: Patient, medical team, nursing team  Thank you for allowing [place] to participate in the care of Chloeann Alfred Adelstein PMT will continue to support holistically.  Visit consisted of counseling and education dealing with the complex and emotionally intense issues of symptom management and palliative care in the setting of serious and potentially life-threatening illness. Greater than 50%  of this time was spent counseling and coordinating care related to the above assessment and plan.  Walden Field, NP Palliative Medicine Team  Team Phone # [phone] (Nights/Weekends)  05/17/2021, 8:17 AM

## 2021-09-19 NOTE — Progress Notes (Signed)
This RN  to bedside for IV consult, family at bedside as well. Patient has had numerous IVs during visit and has moved to comfort care. Discussed other options with family including SubQ administration or oral, before attempting IV again for patient comfort, family wishing to pursue SubQ before replacing IV access. Discussed this with primary RN; primary RN to ask for SubQ set-up. IV-Team consult to be discontinued for now, may place new consult if unable to switch to oral or SubQ administration.

## 2021-09-19 NOTE — Care Management (Addendum)
Called [facility], spoke to [place] regarding referral for residential hospice. Lana requested clinicals to be faxed to [phone] for review. Lana requesting documentation of prognosis. Secure chatted Sharyn Lull with PC and attending.  Referral faxed

## 2021-09-20 DIAGNOSIS — Z515 Encounter for palliative care: Secondary | ICD-10-CM | POA: Diagnosis not present

## 2021-09-20 DIAGNOSIS — I25110 Atherosclerotic heart disease of native coronary artery with unstable angina pectoris: Secondary | ICD-10-CM | POA: Diagnosis not present

## 2021-09-20 DIAGNOSIS — N186 End stage renal disease: Secondary | ICD-10-CM | POA: Diagnosis not present

## 2021-09-20 DIAGNOSIS — I200 Unstable angina: Secondary | ICD-10-CM | POA: Diagnosis not present

## 2021-09-20 DIAGNOSIS — Z7189 Other specified counseling: Secondary | ICD-10-CM | POA: Diagnosis not present

## 2021-09-20 DIAGNOSIS — Z951 Presence of aortocoronary bypass graft: Secondary | ICD-10-CM | POA: Diagnosis not present

## 2021-09-20 DIAGNOSIS — R079 Chest pain, unspecified: Secondary | ICD-10-CM | POA: Diagnosis not present

## 2021-09-20 MED ORDER — HYDROCORTISONE 1 % EX CREA
TOPICAL_CREAM | Freq: Three times a day (TID) | CUTANEOUS | Status: DC
  Filled 2021-09-20 (×2): qty 28

## 2021-09-20 MED ORDER — DIPHENHYDRAMINE HCL 25 MG PO CAPS
25.0000 mg | ORAL_CAPSULE | Freq: Four times a day (QID) | ORAL | Status: DC | PRN
  Administered 2021-09-20 – 2021-09-21 (×2): 25 mg via ORAL
  Filled 2021-09-20 (×3): qty 1

## 2021-09-20 MED ORDER — CALCIUM CARBONATE ANTACID 500 MG PO CHEW
200.0000 mg | CHEWABLE_TABLET | Freq: Two times a day (BID) | ORAL | Status: DC
  Administered 2021-09-20 – 2021-09-21 (×4): 200 mg via ORAL
  Filled 2021-09-20 (×4): qty 1

## 2021-09-20 NOTE — Progress Notes (Signed)
Daily Progress Note   Patient Name: Donna Howe       Date: 09/20/2021 DOB: 1955-12-25  Age: 66 y.o. MRN#: [id number] Attending Physician: Kerney Elbe, DO Primary Care Physician: Curlene Labrum, MD Admit Date: 09/08/2021 Length of Stay: 12 days  Reason for Consultation/Follow-up: Establishing goals of care and Terminal Care  HPI/Patient Profile:  66 y.o. female  with past medical history of  status post CABG in 2005 with multiple PCI's since, multiple NSTEMI's and has been deemed too high risk for CABG re-do. HTN. HLD, type 2 diabetes, and ESRD on HD MWF who presented to the emergency department with complaints of recurrent chest pain.  Patient reports several episodes of substernal chest pain unrelieved by nitroglycerin (utilized and entire bottle prior to admission and 1 week). Cardiology evaluated patient on admission and plan was made to repeat heart catheterization.  This was performed 12/22 and revealed three-vessel occlusive CAD.  Patient was taken back for heart catheterization 12/23 for PCI and balloon angioplasty was performed to left main.    Critical care consulted 12/26 due to persistent severe hypertension.    Patient now being treated for NSTEMI without further revascularization options, mixed cardiogenic and distributive shock, ischemic and valvular cardiomyopathy, duodenitis versus contained gastric perforation, ESRD on HD, and others.   PMT was consulted for goals of care conversations.  Subjective:   Subjective: Chart Reviewed. Updates received. Patient Assessed. Created space and opportunity for patient  and family to explore thoughts and feelings regarding current medical situation.  Today's Discussion: Today I met with the patient and her brother at the bedside.  She appears much more sleepy/somnolent today, although she does answer questions.  She asked that I speak with her brother because she is tired.  Overall, the patient is still comfortable.   However, one symptom that has cropped up is significant itching and an inability to tolerate thin liquids at this point.  I discussed the goal for comfort and that if she can tolerate thin liquids and we can provide other things that she does tolerate such as ice or thickened foods like pudding, Jell-O, etc.  He verbalized understanding.  I explained that the itching is likely uremic pruritus.  He states that the hospital doctor had just left and ordered hydrocortisone topical cream and Benadryl.  She has not received these as of yet.  The patient brother had some concern about transfer to home hospice as he felt that this was similar to a nursing home and he has not had good experiences with nursing homes.  I reassured him that a hospice facility is not a nursing home and the care is typically quite good and the hospice home in [facility] has a good reputation.  He seems quite reassured by this.  I provided emotional general support through therapeutic listening, empathy, and other techniques.  Answered all questions and addressed all concerns to the best of my ability.  Review of Systems  Constitutional:  Positive for fatigue.  Respiratory:  Negative for cough and shortness of breath.   Cardiovascular:  Negative for chest pain.  Gastrointestinal:  Negative for abdominal pain, nausea and vomiting.  Skin:        Itching, primarily upper chest   Objective:   Vital Signs:  BP (!) 52/26 (BP Location: Left Arm)    Pulse 72    Temp (!) 97.5 F (36.4 C) (Oral)    Resp 15    Ht 5' 5"  (1.651 m)  Wt 87.8 kg    SpO2 (!) 78%    BMI 32.21 kg/m   Physical Exam: Physical Exam Vitals and nursing note reviewed.  Constitutional:      General: She is sleeping.  HENT:     Head: Normocephalic and atraumatic.  Cardiovascular:     Rate and Rhythm: Normal rate.  Pulmonary:     Effort: No respiratory distress.     Breath sounds: No wheezing or rhonchi.  Abdominal:     General: Abdomen is flat.      Palpations: Abdomen is soft.  Skin:    General: Skin is warm and dry.  Neurological:     Mental Status: She is easily aroused. She is lethargic.    Palliative Assessment/Data: 20%   Assessment & Plan:   Impression: Present on Admission:  Unstable angina (HCC)  Mixed hyperlipidemia  Obesity (BMI 30-39.9)  CAD (coronary artery disease)  Diabetes mellitus with ESRD (end-stage renal disease) (HCC)  ESRD (end stage renal disease) ([place])  SUMMARY OF RECOMMENDATIONS   Continue DNR Continue comfort care Continue to pursue residential hospice placement when bed available Continued emotional and spiritual support of patient and family PMT will continue to monitor for symptoms  Code Status: DNR  Prognosis: < 2 weeks  Discharge Planning: Hospice facility  Discussed with: Patient, family, medical team, nursing team  One or more severe exacerbations of chronic illnesses that pose a threat to life, continued parenteral controlled substances, discussion of management plan with hospice, TOC, primary team.  Thank you for allowing [place] to participate in the care of Lynann Demetrius Underhill PMT will continue to support holistically.  Walden Field, NP Palliative Medicine Team  Team Phone # [phone] (Nights/Weekends)  05/17/2021, 8:17 AM

## 2021-09-20 NOTE — Progress Notes (Signed)
° ° ° °  I contacted the intake office of hospice and palliative care [place].  I inquired about the status of the referral for residential placement for this patient.  They indicated that the referral has been received and sent to the medical director for their acceptance of residential need.  They expect to hear back sometime today or first thing tomorrow morning.  We will continue to follow for residential placement of hospice services.  Thank you for your referral and allowing PMT to assist in [place] care.   Walden Field, NP Palliative Medicine Team Phone: [phone]  NO CHARGE

## 2021-09-20 NOTE — Progress Notes (Signed)
PROGRESS NOTE    Donna Howe  HMC:[id number] DOB: April 02, 1956 DOA: 09/08/2021 PCP: Curlene Labrum, MD   Brief Narrative:  Patient is a 66 year old obese Caucasian female with an extensive past medical history significant for but not limited to CAD status post CABG in 2005 with multiple PCI's since, multiple NSTEMI's as well as other comorbidities who was deemed too high risk for CABG redo, hypertension, hyperlipidemia, diabetes mellitus type 2, history of ESRD on hemodialysis Monday Wednesday Friday as well as other comorbidities who presented to the ED with recurrent chest pain.  She reports several episodes of substernal chest pain that was unrelieved by nitroglycerin as she utilized entire bottle prior to mission within 1 week.  Cardiology evaluated the patient on admission and plan was to repeat her heart catheterization.  This was performed on 09/08/2021 and this revealed three-vessel occlusive CAD.  She was taken back for heart catheterization on 09/09/2021 for PCI balloon angioplasty that was performed the left main artery.  Subsequently since then critical care was consulted due to persistent severe hypotension with associated hypoxia.  She was transferred to the ICU for further management.  09/14/2021 there is no escalation in care and given her multiple comorbidities after palliative care discussion she is transition to full comfort given that she was in cardiogenic shock from ACS and had an ischemic mitral valve with no treatment options.  She also had abdominal complaints and there was questionable duodenitis versus a contained perforation and recommended conservative management as she is not a operative candidate.  She was transitioned to the [facility] service on 09/19/2021 and she is on full comfort measures and will transition to the [facility] [address]. for ongoing care.  Given high risk for further decompensation palliative care has been assisting and we are attempting to get her to residential hospice at  [facility] but if she does not get a bed soon anticipate in-hospital death.  Patient was complaining about some pain and nausea today and also complained about difficulty swallowing her diet.  SLP was consulted for comfort diet recommendations.  Still awaiting placement in residential hospice.  Assessment & Plan:   Principal Problem:   Unstable angina (HCC) Active Problems:   Mixed hyperlipidemia   Obesity (BMI 30-39.9)   CAD (coronary artery disease)   Hx of CABG   Diabetes mellitus with ESRD (end-stage renal disease) ([place])   ESRD (end stage renal disease) (HCC)   Pressure injury of skin  Chest pain with unstable angina and recent NSTEMI -She is status post cardiac catheterization and PCI to left main into left circumflex with balloon angioplasty and shockwave therapy.  Her first OM is not salvageable given her severe disease and her LVEF was 35 to 40% on echo -Cardiology felt she had no further revascularization options options and she subsequently went into cardiogenic shock with ischemic mitral valve -Continue with comfort measures  ESRD on hemodialysis Monday Wednesday Friday Tuesdays, Thursdays, Saturday -She is initiated on CRRT on 09/13/2021 but this was stopped given her cardiogenic shock -She is transition to full comfort measures and awaiting residential hospice placement  Anemia of chronic kidney disease -Will not check any more hemoglobin given that she is now full comfort measures  Hypotension in the setting of cardiogenic shock and distributive shock Ischemic mitral valve failure and cardiomyopathy -No treatment options and will remain for comfort  Congestive hepatopathy from worsening heart failure -LFT started trending down on last check but will not check any more -We will check liver numbers  given that she is full comfort care high risk for further decompensation  Diabetes mellitus type 2 with hyperglycemia -Will not check blood sugars given that she is now  full comfort care  Hypoalbuminemia -Last checked albumin was 2.9  Hypophosphatemia -Last phosphorus level was 1.5 and will not replete given that she is now full comfort measures  Obesity -Complicates overall prognosis and care -Estimated body mass index is 32.21 kg/m as calculated from the following:   Height as of this encounter: 5\' 5"  (1.651 m).   Weight as of this encounter: 87.8 kg.  -Weight Loss and Dietary Counseling given  Dysphagia -Consulted SLP for comfort diet recommendations -Complaints of burning and reflux so we have added Tums  Itching -Complaining of itching in her arms and shoulders so added Benadryl as well as topical hydrocortisone  All medications not in the patient's comfort have been discontinued with a goal to try to keep her as comfortable as possible.  She has acetaminophen, albuterol, Biotene antiseptic oral rinse, Breo Ellipta, glycopyrrolate for secretions, hydromorphone scheduled and as needed, ipratropium, lorazepam, ondansetron, polyvinyl ophthalmic solutions as well as antiemetics with promethazine  DVT prophylaxis: None Code Status: DO NOT RESUSCITATE Family Communication: Discussed with brother at bedside Disposition Plan: Anticipating transitioning to residential hospice if she gets to bed but if she does not get a bed soon she is at risk for further decompensation and in-hospital death  Status is: Inpatient  Remains inpatient appropriate because: Currently awaiting residential hospice bed  Consultants:  Cardiology Nephrology PCCM Transfer  Procedures:  12/22 admitted with complaints of chest pain underwent diagnostic left heart cath which revealed severe three-vessel occlusive disease 12/23 underwent balloon angioplasty to left main 12/26 developed significant hypotension resulting in transfer ICU for further management 12/28 transition to no escalation, awaiting family arrival   Antimicrobials:  Anti-infectives (From admission,  onward)    Start     Dose/Rate Route Frequency Ordered Stop   09/12/21 1715  cefTRIAXone (ROCEPHIN) 2 g in sodium chloride 0.9 % 100 mL IVPB  Status:  Discontinued        2 g 200 mL/hr over 30 Minutes Intravenous Every 24 hours 09/12/21 1619 09/17/21 1611   09/12/21 1715  metroNIDAZOLE (FLAGYL) IVPB 500 mg  Status:  Discontinued        500 mg 100 mL/hr over 60 Minutes Intravenous Every 12 hours 09/12/21 1619 09/17/21 1611        Subjective: Seen and examined at bedside and she states that she has been okay but brother states that she is having trouble tolerating and swallowing her diet and complaining of her choking and coughing.  She is also complaining about some acid reflux and also complained of some abdominal pain and chest pain as well as some nausea.  Has not yet received any nausea medication or her pain medications yet.  Have added Tums for her reflux since since she was last complained of itching added Benadryl.  She had no other concerns or complaints at this time still awaiting residential hospice bed placement  Objective: Vitals:   09/18/21 0904 09/18/21 1559 09/19/21 0448 09/19/21 2051  BP: (!) 79/34 (!) 75/28 (!) 83/39 (!) 52/26  Pulse: 73 72 84 72  Resp: 18 19 18 15   Temp: 97.7 F (36.5 C) 98.1 F (36.7 C) 99.6 F (37.6 C) (!) 97.5 F (36.4 C)  TempSrc:  Oral Oral Oral  SpO2: 99%  90% (!) 78%  Weight:      Height:  Intake/Output Summary (Last 24 hours) at 09/20/2021 1521 Last data filed at 09/20/2021 1100 Gross per 24 hour  Intake 240 ml  Output --  Net 240 ml   Filed Weights   09/14/21 0500 09/16/21 0600 09/17/21 0600  Weight: 98.5 kg 92.6 kg 87.8 kg   Examination: Physical Exam:  Constitutional: WN/WD obese chronically ill-appearing Caucasian female currently sitting up in the bed in no acute distress but complaining of some abdominal pain and nausea Eyes: Lids and conjunctivae normal, sclerae anicteric  ENMT: External Ears, Nose appear normal.  Grossly normal hearing.  Neck: Appears normal, supple, no cervical masses, normal ROM, no appreciable thyromegaly; mild JVD Respiratory: Diminished to auscultation bilaterally with coarse breath sounds, no wheezing, rales, rhonchi or crackles. Normal respiratory effort and patient is not tachypenic. No accessory muscle use.  Unlabored breathing Cardiovascular: RRR, has a murmur noted.  Has 1+ lower extremity edema Abdomen: Soft, tender to palpate, distended secondary body habitus. Bowel sounds positive.  GU: Deferred. Musculoskeletal: No clubbing / cyanosis of digits/nails. No joint deformity upper and lower extremities.  Skin: No rashes, lesions, ulcers on limited skin evaluation. No induration; Warm and dry.  Neurologic: CN 2-12 grossly intact with no focal deficits. Romberg sign and cerebellar reflexes not assessed.  Psychiatric: Normal judgment and insight. Alert and oriented x 3.  Depressed appearing mood and slightly flat affect   Data Reviewed: I have personally reviewed following labs and imaging studies  CBC: No results for input(s): WBC, NEUTROABS, HGB, HCT, MCV, PLT in the last 168 hours.  Basic Metabolic Panel: Recent Labs  Lab 09/14/21 0433 09/14/21 1739 09/15/21 0430 09/15/21 1557 09/16/21 0445 09/16/21 1616  NA 135 135 133* 135 134* 135  K 4.6 4.3 4.1 4.0 4.1 3.9  CL 100 101 101 100 102 99  CO2 24 24 23 24 23 24   GLUCOSE 134* 152* 173* 188* 229* 212*  BUN 29* 19 14 12 10 11   CREATININE 3.43* 2.36* 1.83* 1.43* 1.51* 1.61*  CALCIUM 8.5* 8.8* 8.5* 8.7* 8.8* 9.0  MG 2.4  --  2.4  --  2.6*  --   PHOS 3.4 2.2* 1.9* 1.5* 1.5* 1.5*    GFR: Estimated Creatinine Clearance: 39.1 mL/min (A) (by C-G formula based on SCr of 1.61 mg/dL (H)). Liver Function Tests: Recent Labs  Lab 09/14/21 0433 09/14/21 1739 09/15/21 0430 09/15/21 1557 09/16/21 0445 09/16/21 1616 09/17/21 0410  AST 322*  --  1,580*  --  1,407*  --  344*  ALT 203*  --  752*  --  1,065*  --  648*   ALKPHOS 199*  --  186*  --  245*  --  171*  BILITOT 1.1  --  1.2  --  1.5*  --  1.1  PROT 5.7*  --  6.0*  --  6.8  --  6.0*  ALBUMIN 3.0*   < > 2.9*   2.9* 3.1* 3.3*   3.2* 3.2* 2.9*   < > = values in this interval not displayed.    No results for input(s): LIPASE, AMYLASE in the last 168 hours. No results for input(s): AMMONIA in the last 168 hours. Coagulation Profile: No results for input(s): INR, PROTIME in the last 168 hours. Cardiac Enzymes: No results for input(s): CKTOTAL, CKMB, CKMBINDEX, TROPONINI in the last 168 hours. BNP (last 3 results) No results for input(s): PROBNP in the last 8760 hours. HbA1C: No results for input(s): HGBA1C in the last 72 hours. CBG: Recent Labs  Lab 09/16/21  [place] 09/16/21 1616 09/16/21 2244 09/17/21 0708 09/17/21 1116  GLUCAP 185* 193* 223* 275* 144*    Lipid Profile: No results for input(s): CHOL, HDL, LDLCALC, TRIG, CHOLHDL, LDLDIRECT in the last 72 hours. Thyroid Function Tests: No results for input(s): TSH, T4TOTAL, FREET4, T3FREE, THYROIDAB in the last 72 hours. Anemia Panel: No results for input(s): VITAMINB12, FOLATE, FERRITIN, TIBC, IRON, RETICCTPCT in the last 72 hours. Sepsis Labs: No results for input(s): PROCALCITON, LATICACIDVEN in the last 168 hours.   No results found for this or any previous visit (from the past 240 hour(s)).   RN Pressure Injury Documentation: Pressure Injury 09/12/21 Thigh Anterior;Left;Proximal Stage 1 -  Intact skin with non-blanchable redness of a localized area usually over a bony prominence. skin tear to left upper groin just below belly (Active)  09/12/21 1245  Location: Thigh  Location Orientation: Anterior;Left;Proximal  Staging: Stage 1 -  Intact skin with non-blanchable redness of a localized area usually over a bony prominence.  Wound Description (Comments): skin tear to left upper groin just below belly  Present on Admission:      Estimated body mass index is 32.21 kg/m as  calculated from the following:   Height as of this encounter: 5\' 5"  (1.651 m).   Weight as of this encounter: 87.8 kg.  Malnutrition Type:   Malnutrition Characteristics:   Nutrition Interventions:   Radiology Studies: No results found.  Scheduled Meds:  calcium carbonate  200 mg of elemental calcium Oral BID   Chlorhexidine Gluconate Cloth  6 each Topical Q0600   fluticasone furoate-vilanterol  1 puff Inhalation Daily   hydrocortisone cream   Topical TID    HYDROmorphone (DILAUDID) injection  1 mg Intravenous Q8H   Continuous Infusions:  promethazine (PHENERGAN) injection (IM or IVPB) 12.5 mg (09/20/21 1430)    LOS: 12 days   Kerney Elbe, DO [facility] PAGER is on AMION  If 7PM-7AM, please contact night-coverage [email or web address]

## 2021-09-21 ENCOUNTER — Ambulatory Visit: Payer: Medicare HMO | Admitting: Student

## 2021-09-21 DIAGNOSIS — E1122 Type 2 diabetes mellitus with diabetic chronic kidney disease: Secondary | ICD-10-CM | POA: Diagnosis not present

## 2021-09-21 DIAGNOSIS — Z515 Encounter for palliative care: Secondary | ICD-10-CM

## 2021-09-21 DIAGNOSIS — I200 Unstable angina: Secondary | ICD-10-CM | POA: Diagnosis not present

## 2021-09-21 DIAGNOSIS — I25110 Atherosclerotic heart disease of native coronary artery with unstable angina pectoris: Secondary | ICD-10-CM | POA: Diagnosis not present

## 2021-09-21 DIAGNOSIS — N186 End stage renal disease: Secondary | ICD-10-CM | POA: Diagnosis not present

## 2021-09-21 DIAGNOSIS — Z7189 Other specified counseling: Secondary | ICD-10-CM | POA: Diagnosis not present

## 2021-09-21 LAB — RESP PANEL BY RT-PCR (FLU A&B, COVID) ARPGX2
Influenza A by PCR: NEGATIVE
Influenza B by PCR: NEGATIVE
SARS Coronavirus 2 by RT PCR: NEGATIVE

## 2021-09-21 MED ORDER — GABAPENTIN 400 MG PO CAPS: 400.0000 mg | ORAL_CAPSULE | Freq: Every day | ORAL | 0 refills | Status: AC

## 2021-09-21 MED ORDER — ONDANSETRON HCL 8 MG PO TABS: 8.0000 mg | ORAL_TABLET | Freq: Three times a day (TID) | ORAL | 0 refills | Status: AC | PRN

## 2021-09-21 MED ORDER — HYDROMORPHONE HCL 1 MG/ML IJ SOLN: 1.0000 mg | INTRAMUSCULAR | Status: DC | PRN

## 2021-09-21 MED ORDER — LORAZEPAM 2 MG/ML IJ SOLN: 1.0000 mg | INTRAMUSCULAR | Status: DC | PRN

## 2021-09-21 MED ORDER — LORAZEPAM 0.5 MG PO TABS: 0.5000 mg | ORAL_TABLET | Freq: Three times a day (TID) | ORAL | 0 refills | Status: AC | PRN

## 2021-09-21 MED ORDER — HYDROMORPHONE HCL 1 MG/ML IJ SOLN
1.0000 mg | INTRAMUSCULAR | Status: DC | PRN
  Administered 2021-09-21: 1 mg via INTRAVENOUS
  Filled 2021-09-21: qty 1

## 2021-09-21 MED ORDER — HYDROMORPHONE HCL 1 MG/ML IJ SOLN
1.0000 mg | INTRAMUSCULAR | Status: DC
  Administered 2021-09-21 (×2): 1 mg via INTRAVENOUS
  Filled 2021-09-21 (×2): qty 1

## 2021-09-21 MED ORDER — MORPHINE SULFATE (CONCENTRATE) 20 MG/ML PO SOLN: 10.0000 mg | ORAL | 0 refills | Status: AC | PRN

## 2021-09-21 MED ORDER — HYDROCODONE-ACETAMINOPHEN 5-325 MG PO TABS: 1.0000 | ORAL_TABLET | Freq: Four times a day (QID) | ORAL | Status: DC | PRN

## 2021-09-21 NOTE — Progress Notes (Addendum)
Daily Progress Note   Patient Name: Donna Howe       Date: 09/21/2021 DOB: Mar 20, 1956  Age: 66 y.o. MRN#: [id number] Attending Physician: Caren Griffins, MD Primary Care Physician: Curlene Labrum, MD Admit Date: 09/08/2021 Length of Stay: 13 days  Reason for Consultation/Follow-up: Establishing goals of care and Terminal Care  HPI/Patient Profile:  66 y.o. female  with past medical history of  status post CABG in 2005 with multiple PCI's since, multiple NSTEMI's and has been deemed too high risk for CABG re-do. HTN. HLD, type 2 diabetes, and ESRD on HD MWF who presented to the emergency department with complaints of recurrent chest pain.  Patient reports several episodes of substernal chest pain unrelieved by nitroglycerin (utilized and entire bottle prior to admission and 1 week). Cardiology evaluated patient on admission and plan was made to repeat heart catheterization.  This was performed 12/22 and revealed three-vessel occlusive CAD.  Patient was taken back for heart catheterization 12/23 for PCI and balloon angioplasty was performed to left main.    Critical care consulted 12/26 due to persistent severe hypertension.    Patient now being treated for NSTEMI without further revascularization options, mixed cardiogenic and distributive shock, ischemic and valvular cardiomyopathy, duodenitis versus contained gastric perforation, ESRD on HD, and others.   PMT was consulted for goals of care conversations.  Patient has been transitioned to full comfort measures only and awaiting bed at [facility].   Subjective:   Subjective: Chart Reviewed. Updates received. Patient Assessed.  Family at bedside. Patient has been offered a bed at [facility] and they are expecting her to be discharged today.   She complains primarily of feeling SOB and says, "its no good".     Review of Systems  Constitutional:  Positive for fatigue.  Respiratory:   Positive for shortness of breath. Negative for cough.   Cardiovascular:  Negative for chest pain.  Gastrointestinal:  Negative for abdominal pain, nausea and vomiting.  Skin:        Itching, primarily upper chest   Objective:   Vital Signs:  BP (!) 84/36 (BP Location: Left Arm)    Pulse 74    Temp 97.8 F (36.6 C)    Resp 16    Ht 5\' 5"  (1.651 m)    Wt 87.8 kg    SpO2 93%    BMI 32.21 kg/m   Physical Exam: Physical Exam Vitals and nursing note reviewed.  Constitutional:      General: She is sleeping.  HENT:     Head: Normocephalic and atraumatic.  Cardiovascular:     Rate and Rhythm: Normal rate.  Pulmonary:     Comments: RR and effort increased  Skin:    General: Skin is warm and dry.  Neurological:     Mental Status: She is easily aroused. She is lethargic.     Comments: lethargic    Palliative Assessment/Data: 20%   Assessment & Plan:   Impression: Present on Admission:  Unstable angina (HCC)  Mixed hyperlipidemia  Obesity (BMI 30-39.9)  CAD (coronary artery disease)  Diabetes mellitus with ESRD (end-stage renal disease) ([place])  ESRD (end stage renal disease) ([place])  SUMMARY OF RECOMMENDATIONS   Patient with NSTEMI without options for revascularization in the setting of ESRD on HD- transitioned to comfort care only and support through dying process. HD has been stopped.  SOB- currently with q8hr hydromorphone IV, will increase to q4hr and increase prn frequency to q15 min  prn  Code Status: DNR  Prognosis: < 2 weeks  Discharge Planning: Hospice facility  Discussed with: Patient, family, medical team, nursing team  Thank you for allowing [place] to participate in the care of Donna Howe  Mariana Kaufman, [facility] Palliative Medicine  Team Phone # [phone] (Nights/Weekends)  05/17/2021, 8:17 AM

## 2021-09-21 NOTE — Care Management (Addendum)
Spoke to [place] at [facility] of [place]. Market researcher of [employer] is reviewing clinicals, once determination is made , Vito Backers will call NCM back.   If patient accepted , she will need a negative covid test.   1140 Called patient's son Larkin Ina and left voicemail  31 Family including Larkin Ina and patient's brother at bedside. They spoke to [place] at [facility] explained that once symptoms are controlled there is a daily fee $140.00. All residential hospice's have this. Family is concerned that patient's symptoms will not be controlled so how does hospice determine this. NCM tried to explained and offered to call Cassandra at [facility] but they have already talked to [place].   Hospice will work with they family regarding fee and has asked family to bring in bank statements. Family aware.   Family asked if patient is ready for discharge. NCM explained yes. NCM mentioned home with hospice , but family does not feel that is an option.   Family does not feel that patient is stable for transfer.   NCM secure chatted MD and asked if he can talk to family.     1400 Palliative has spoken to family and they have accepted bed at [facility]. NCM confirmed with Larkin Ina and patient and patient's brother at bedside. Larkin Ina is currently working on consents via computer now. Cassandra with [place] requesting covid test prior to patient being transferred . MD ordered , nurse aware. NCM explained to family also.   Once covid test back and is negative patient can be transferred to [facility]. NCM called [facility] spoke to [place] and patient is on will call. Nurse needs to call [facility] [phone] once covid test is back and negative. Nurse can call report to  [phone]    1615 Covid negative called [facility]

## 2021-09-21 NOTE — Evaluation (Signed)
Clinical/Bedside Swallow Evaluation Patient Details  Name: Donna Howe MRN: [id number] Date of Birth: 1955/09/28  Today's Date: 09/21/2021 Time: SLP Start Time (ACUTE ONLY): 0845 SLP Stop Time (ACUTE ONLY): 0904 SLP Time Calculation (min) (ACUTE ONLY): 19 min  Past Medical History:  Past Medical History:  Diagnosis Date   Anemia    Anxiety    Asthma    CAD (coronary artery disease)    Multivessel s/p CABG 2005, numerous PCIs since that time and documented graft disease   Carotid artery disease ([place])    R CEA   ESRD on hemodialysis ([place])    Essential hypertension    Gout    HFmrEF (heart failure with mildly reduced EF)    EF ~ 40 by LV gram at cath 08/26/2021   History of blood transfusion    Hyperlipidemia    Hypothyroidism    Myocardial infarction [facility])    PAD (peripheral artery disease) ([place])    Dr. Kellie Simmering   Pneumonia 09/2019, 11/2019   S/P angioplasty with stent- DES to mRCA and to LIMA to LAD with DES 04/09/18.   04/10/2018   SBO (small bowel obstruction) ([place]) 2011   Status post lysis of adhesions & hernia repair   Sinus bradycardia    Type 2 diabetes mellitus ([place])    Umbilical hernia    Past Surgical History:  Past Surgical History:  Procedure Laterality Date   AORTIC ARCH ANGIOGRAPHY N/A 08/02/2020   Procedure: AORTIC ARCH ANGIOGRAPHY;  Surgeon: Waynetta Sandy, MD;  Location: [place] CV LAB;  Service: Cardiovascular;  Laterality: N/A;  Lt upper extermity   AV FISTULA PLACEMENT Left 06/29/2020   Procedure: LEFT ARM ARTERIOVENOUS (AV) FISTULA;  Surgeon: Waynetta Sandy, MD;  Location: [place];  Service: Vascular;  Laterality: Left;  ARM   AV FISTULA PLACEMENT Right 05/11/2021   Procedure: RIGHT BRACHIOCEPHALIC ARTERIOVENOUS (AV) FISTULA CREATION;  Surgeon: Waynetta Sandy, MD;  Location: [facility] OR;  Service: Vascular;  Laterality: Right;   [place]  2010   CORONARY ARTERY BYPASS GRAFT  2005   CORONARY  BALLOON ANGIOPLASTY N/A 05/31/2017   Procedure: CORONARY BALLOON ANGIOPLASTY;  Surgeon: Jettie Booze, MD;  Location: [place] CV LAB;  Service: Cardiovascular;  Laterality: N/A;   CORONARY BALLOON ANGIOPLASTY N/A 06/14/2021   Procedure: CORONARY BALLOON ANGIOPLASTY;  Surgeon: Jettie Booze, MD;  Location: [place] CV LAB;  Service: Cardiovascular;  Laterality: N/A;   CORONARY BALLOON ANGIOPLASTY N/A 08/26/2021   Procedure: CORONARY BALLOON ANGIOPLASTY;  Surgeon: Leonie Man, MD;  Location: [place] CV LAB;  Service: Cardiovascular;  Laterality: N/A;   CORONARY BALLOON ANGIOPLASTY N/A 09/09/2021   Procedure: CORONARY BALLOON ANGIOPLASTY;  Surgeon: Martinique, Peter M, MD;  Location: [place] CV LAB;  Service: Cardiovascular;  Laterality: N/A;   CORONARY STENT INTERVENTION N/A 05/31/2017   Procedure: CORONARY STENT INTERVENTION;  Surgeon: Jettie Booze, MD;  Location: [place] CV LAB;  Service: Cardiovascular;  Laterality: N/A;   CORONARY STENT INTERVENTION N/A 04/09/2018   Procedure: CORONARY STENT INTERVENTION;  Surgeon: Jettie Booze, MD;  Location: [place] CV LAB;  Service: Cardiovascular;  Laterality: N/A;  SVG RCA   CORONARY STENT INTERVENTION N/A 01/10/2021   Procedure: CORONARY STENT INTERVENTION;  Surgeon: Leonie Man, MD;  Location: [place] [facility] LAB;  Service: Cardiovascular;  Laterality: N/A;   CORONARY/GRAFT ACUTE MI REVASCULARIZATION N/A 02/24/2021   Procedure: Coronary/Graft Acute MI Revascularization;  Surgeon: Larae Grooms  S, MD;  Location: [place] [facility];  Service: Cardiovascular;  Laterality: N/A;   ENDARTERECTOMY Right 04/18/2013   Procedure: ENDARTERECTOMY CAROTID;  Surgeon: Mal Misty, MD;  Location: [place];  Service: Vascular;  Laterality: Right;   HERNIA REPAIR  1989   Incisional hernia repair x2  03/04/2010   Laparoscopic with 35cm mesh by Dr Ronnald Collum   IR [place] CV LINE RIGHT  06/21/2020   IR US GUIDE VASC  ACCESS RIGHT  06/21/2020   LEFT HEART CATH AND CORS/GRAFTS ANGIOGRAPHY N/A 05/31/2017   Procedure: LEFT HEART CATH AND CORS/GRAFTS ANGIOGRAPHY;  Surgeon: Jettie Booze, MD;  Location: [place] CV LAB;  Service: Cardiovascular;  Laterality: N/A;   LEFT HEART CATH AND CORS/GRAFTS ANGIOGRAPHY N/A 04/08/2018   Procedure: LEFT HEART CATH AND CORS/GRAFTS ANGIOGRAPHY;  Surgeon: Jettie Booze, MD;  Location: [place] [facility] LAB;  Service: Cardiovascular;  Laterality: N/A;   LEFT HEART CATH AND CORS/GRAFTS ANGIOGRAPHY N/A 06/22/2020   Procedure: LEFT HEART CATH AND CORS/GRAFTS ANGIOGRAPHY;  Surgeon: Belva Crome, MD;  Location: [place] [facility] LAB;  Service: Cardiovascular;  Laterality: N/A;   LEFT HEART CATH AND CORS/GRAFTS ANGIOGRAPHY N/A 01/10/2021   Procedure: LEFT HEART CATH AND CORS/GRAFTS ANGIOGRAPHY;  Surgeon: Leonie Man, MD;  Location: [place] [facility];  Service: Cardiovascular;  Laterality: N/A;   LEFT HEART CATH AND CORS/GRAFTS ANGIOGRAPHY N/A 02/24/2021   Procedure: LEFT HEART CATH AND CORS/GRAFTS ANGIOGRAPHY;  Surgeon: Jettie Booze, MD;  Location: [place] [facility] LAB;  Service: Cardiovascular;  Laterality: N/A;   LEFT HEART CATH AND CORS/GRAFTS ANGIOGRAPHY N/A 06/14/2021   Procedure: LEFT HEART CATH AND CORS/GRAFTS ANGIOGRAPHY;  Surgeon: Jettie Booze, MD;  Location: [place] [facility];  Service: Cardiovascular;  Laterality: N/A;   LEFT HEART CATH AND CORS/GRAFTS ANGIOGRAPHY N/A 08/26/2021   Procedure: LEFT HEART CATH AND CORS/GRAFTS ANGIOGRAPHY;  Surgeon: Leonie Man, MD;  Location: [place] CV LAB;  Service: Cardiovascular;  Laterality: N/A;   LEFT HEART CATH AND CORS/GRAFTS ANGIOGRAPHY N/A 09/08/2021   Procedure: LEFT HEART CATH AND CORS/GRAFTS ANGIOGRAPHY;  Surgeon: Martinique, Peter M, MD;  Location: [place] CV LAB;  Service: Cardiovascular;  Laterality: N/A;   LEFT HEART CATHETERIZATION WITH CORONARY ANGIOGRAM N/A 12/19/2012   Procedure: LEFT HEART  CATHETERIZATION WITH CORONARY ANGIOGRAM;  Surgeon: Josue Hector, MD;  Location: [facility] CATH LAB;  Service: Cardiovascular;  Laterality: N/A;   LEFT HEART CATHETERIZATION WITH CORONARY/GRAFT ANGIOGRAM N/A 04/19/2013   Procedure: LEFT HEART CATHETERIZATION WITH Beatrix Fetters;  Surgeon: Lorretta Harp, MD;  Location: [facility] CATH LAB;  Service: Cardiovascular;  Laterality: N/A;   LIGATION OF ARTERIOVENOUS  FISTULA Left 09/15/2020   Procedure: LIGATION OF LEFT ARM ARTERIOVENOUS  FISTULA;  Surgeon: Waynetta Sandy, MD;  Location: [place];  Service: Vascular;  Laterality: Left;   PATCH ANGIOPLASTY Right 04/18/2013   Procedure: PATCH ANGIOPLASTY Right Internal Carotid Artery;  Surgeon: Mal Misty, MD;  Location: [place];  Service: Vascular;  Laterality: Right;   PERCUTANEOUS CORONARY STENT INTERVENTION (PCI-S) Right 12/19/2012   Procedure: PERCUTANEOUS CORONARY STENT INTERVENTION (PCI-S);  Surgeon: Josue Hector, MD;  Location: [facility] CATH LAB;  Service: Cardiovascular;  Laterality: Right;   PERIPHERAL VASCULAR INTERVENTION Left 08/02/2020   Procedure: PERIPHERAL VASCULAR INTERVENTION;  Surgeon: Waynetta Sandy, MD;  Location: [place] CV LAB;  Service: Cardiovascular;  Laterality: Left;  Left subclavian   PERIPHERAL VASCULAR INTERVENTION  02/24/2021   Procedure: PERIPHERAL VASCULAR  INTERVENTION;  Surgeon: Marty Heck, MD;  Location: [place] CV LAB;  Service: Vascular;;   SHOULDER SURGERY     HPI:  Pt is a 66 year old  female who presented to the ED with recurrent chest pain. Cardiology evaluated and followed on admission. hert catherization for PCI balloon angioplasty performed 12/23. Transeferred to ICU for further management. 12/28 transitioned to full comfort. CXR (12/26) revealed "worsened diffuse pulmonary edema". Per MD note 1/3, pt/family complained about difficulty swallowing.  SLP consulted for comfort diet recommendations.  Still awaiting placement in residential  hospice. PMH: CAD status post CABG in 2005 with multiple PCI's since, multiple NSTEMI's as well as other comorbidities who was deemed too high risk for CABG redo, hypertension, hyperlipidemia, diabetes mellitus type 2, history of ESRD on hemodialysis MWF, as well as other comorbidities.    Assessment / Plan / Recommendation  Clinical Impression  Pt alert and repositioned upright in bed for swallow evaluation this date to determine comfort diet per MD order. Pt reports that she primarily has difficulty swallowing dairy products, although appeared unsure of her swallowing status. Oral mechanism examination was unremarkable and volitional cough was strong with mild congested quality. Ice chips x3, single and sucessive swallows of thin liquids via straw were without overt s/sx of aspiration across multiple trials. Bites of puree and regular textures were also unremarkable for signs of aspiration and complete oral clearance noted. Post intake she reported globus sensation in mid chest. Overt coughing began post intake with pt blowing green mucus from nose into tissue. Sips of water offered to relieve and though cough continued several minutes post intake, suspect no relation to POs. Recommend continue regular, thin liquid diet with adherence to universal swallow/esophageal precautions. SLP to f/u briefly for tolerance given pt/family reports of difficulty with diet.  SLP Visit Diagnosis: Dysphagia, unspecified (R13.10)    Aspiration Risk  Mild aspiration risk    Diet Recommendation Regular;Thin liquid   Liquid Administration via: Cup;Straw Medication Administration: Whole meds with liquid Supervision: Full supervision/cueing for compensatory strategies;Staff to assist with self feeding Compensations: Minimize environmental distractions;Slow rate;Small sips/bites Postural Changes: Seated upright at 90 degrees;Remain upright for at least 30 minutes after po intake    Other  Recommendations Oral Care  Recommendations: Oral care BID    Recommendations for follow up therapy are one component of a multi-disciplinary discharge planning process, led by the attending physician.  Recommendations may be updated based on patient status, additional functional criteria and insurance authorization.  Follow up Recommendations No SLP follow up      Assistance Recommended at Discharge Frequent or constant Supervision/Assistance  Functional Status Assessment Patient has had a recent decline in their functional status and demonstrates the ability to make significant improvements in function in a reasonable and predictable amount of time.  Frequency and Duration min 2x/week  2 weeks       Prognosis Prognosis for Safe Diet Advancement: Good Barriers to Reach Goals: Motivation      Swallow Study   General Date of Onset: 09/20/20 HPI: Pt is a 66 year old  female who presented to the ED with recurrent chest pain. Cardiology evaluated and followed on admission. hert catherization for PCI balloon angioplasty performed 12/23. Transeferred to ICU for further management. 12/28 transitioned to full comfort. Per MD note 1/3, pt/family complained about difficulty swallowing.  SLP consulted for comfort diet recommendations.  Still awaiting placement in residential hospice. PMH: CAD status post CABG in 2005 with multiple PCI's since, multiple NSTEMI's as well  as other comorbidities who was deemed too high risk for CABG redo, hypertension, hyperlipidemia, diabetes mellitus type 2, history of ESRD on hemodialysis MWF, as well as other comorbidities Type of Study: Bedside Swallow Evaluation Previous Swallow Assessment: none per EMR Diet Prior to this Study: Regular;Thin liquids Temperature Spikes Noted: No Respiratory Status: Room air History of Recent Intubation: No Behavior/Cognition: Cooperative;Alert Oral Cavity Assessment: Within Functional Limits Oral Care Completed by SLP: No Oral Cavity - Dentition: Adequate  natural dentition;Missing dentition Vision: Functional for self-feeding Self-Feeding Abilities: Needs assist Patient Positioning: Upright in bed;Postural control adequate for testing Baseline Vocal Quality: Normal Volitional Cough: Strong Volitional Swallow: Able to elicit    Oral/Motor/Sensory Function Overall Oral Motor/Sensory Function: Within functional limits   Ice Chips Ice chips: Within functional limits Presentation: Spoon   Thin Liquid Thin Liquid: Within functional limits Presentation: Straw;Self Fed    Nectar Thick Nectar Thick Liquid: Not tested   Honey Thick Honey Thick Liquid: Not tested   Puree Puree: Within functional limits Presentation: Spoon   Solid     Solid: Within functional limits Other Comments: reports golub sensation post intake     [place], [place], [place] Office Number: [phone]  Acie Fredrickson 09/21/2021,9:24 AM

## 2021-09-21 NOTE — Discharge Summary (Signed)
Physician Discharge Summary  Donna Howe WCB:[id number] DOB: 03-24-1956 DOA: 09/08/2021  PCP: Curlene Labrum, MD  Admit date: 09/08/2021 Discharge date: 09/21/2021  Admitted From: home Disposition:  residential hospice for end of life care  Recommendations for Outpatient Follow-up:  Follow up with PCP in 1-2 weeks Please obtain BMP/CBC in one week Please follow up on the following pending results:  CODE STATUS: DNR  HPI: Per admitting MD, Donna Howe is a 66 y.o. female with past medical history of CAD (s/p CABG in 2005 with multiple PCI's since including PTCA to SVG-OM1 in 01/2004, PTCA/DES to LM and prox LCx in 03/2004 and DES to SVG-OM1, PTCA/DES to SVG-OM in 01/2011, PTCA/DES to LM and LCx in 2015, cutting balloon angioplasty to prox Cx and DES to SVG-RCA in 05/2017, NSTEMI in 03/2018 with DES to mid-RCA and DES to LIMA-LAD, NSTEMI in 06/2020 and medical management recommended as she was felt to be high-risk for re-do CABG and poor target vessels, s/p NSTEMI in 12/2020 with DESx2 to SVG-RCA, NSTEMI in 05/2021 with ISR of stent in the SVG-RCA and treated with angioplasty, NSTEMI in 08/2021 with cath showing 90-95% ISR of LM/LCx stent and treated with angioplasty and reduced to 50%), HTN, HLD, Type 2 DM, carotid artery stenosis (s/p R CEA in 2014), left subclavian stenosis (s/p stent in 07/2020) and ESRD who is being seen for evaluation of chest pain at the request of Dr. Laverta Baltimore.   Hospital Course / Discharge diagnoses: Principal problem Chest pain with unstable angina and recent NSTEMI -cardiology consulted and followed patient while hospitalized.  She is status post cardiac catheterization and PCI to left main into left circumflex with balloon angioplasty and shockwave therapy.  Her first OM is not salvageable given her severe disease and her LVEF was 35 to 40% on echo. Cardiology felt she had no further revascularization options options and she subsequently went into  cardiogenic shock with ischemic mitral valve.  Palliative care consulted, given no good therapeutic options for care was transition towards comfort and she will be discharged to residential hospice  Active problems ESRD on hemodialysis Monday Wednesday Friday Tuesdays, Thursdays, Saturday -She is initiated on CRRT on 09/13/2021 but this was stopped given her cardiogenic shock. She is transition to full comfort measures  Anemia of chronic kidney disease Hypotension in the setting of cardiogenic shock and distributive shock Congestive hepatopathy from worsening heart failure Diabetes mellitus type 2 with hyperglycemia Hypoalbuminemia Hypophosphatemia Obesity, class I Dysphagia Hypoalbuminemia  Discharge Instructions  Discharge Instructions     Amb Referral to Cardiac Rehabilitation   Complete by: As directed    Diagnosis: PTCA   After initial evaluation and assessments completed: Virtual Based Care may be provided alone or in conjunction with Phase 2 Cardiac Rehab based on patient barriers.: Yes      Allergies as of 09/21/2021       Reactions   Penicillins Other (See Comments)   REACTION: Unknown, told as a child Has patient had a PCN reaction causing immediate rash, facial/tongue/throat swelling, SOB or lightheadedness with hypotension: Unknown Has patient had a PCN reaction causing severe rash involving mucus membranes or skin necrosis: Unknown Has patient had a PCN reaction that required hospitalization: Unknown Has patient had a PCN reaction occurring within the last 10 years: No If all of the above answers are "NO", then may proceed with Cephalosporin use.   Beta Adrenergic Blockers Other (See Comments)   Hypotension   Brilinta [ticagrelor]    Shortness  of Breath        Medication List     STOP taking these medications    allopurinol 100 MG tablet Commonly known as: Zyloprim   ALPRAZolam 0.5 MG tablet Commonly known as: XANAX   aspirin EC 81 MG tablet    atorvastatin 80 MG tablet Commonly known as: LIPITOR   clopidogrel 75 MG tablet Commonly known as: PLAVIX   cyclobenzaprine 5 MG tablet Commonly known as: FLEXERIL   ezetimibe 10 MG tablet Commonly known as: ZETIA   ferric citrate 1 GM 210 MG(Fe) tablet Commonly known as: AURYXIA   MIRCERA IJ   multivitamin Tabs tablet   pantoprazole 40 MG tablet Commonly known as: PROTONIX   ranolazine 500 MG 12 hr tablet Commonly known as: RANEXA   VENOFER IV   Vitamin D (Ergocalciferol) 1.25 MG (50000 UNIT) Caps capsule Commonly known as: DRISDOL       TAKE these medications    Albuterol Sulfate 108 (90 Base) MCG/ACT Aepb Commonly known as: PROAIR RESPICLICK Inhale 2 puffs into the lungs every 6 (six) hours as needed (Shortness of breath).   albuterol (2.5 MG/3ML) 0.083% nebulizer solution Commonly known as: PROVENTIL Take 3 mLs (2.5 mg total) by nebulization every 2 (two) hours as needed for wheezing.   budesonide-formoterol 80-4.5 MCG/ACT inhaler Commonly known as: Symbicort Take 2 puffs first thing in am and then another 2 puffs about 12 hours later.   diclofenac Sodium 1 % Gel Commonly known as: VOLTAREN Apply 2 g topically daily as needed (Pain).   gabapentin 400 MG capsule Commonly known as: NEURONTIN Take 1 capsule (400 mg total) by mouth at bedtime.   ipratropium 17 MCG/ACT inhaler Commonly known as: ATROVENT HFA Inhale 2 puffs into the lungs every 4 (four) hours as needed for wheezing.   levothyroxine 125 MCG tablet Commonly known as: SYNTHROID Take 125 mcg by mouth daily before breakfast.   LORazepam 0.5 MG tablet Commonly known as: Ativan Take 1 tablet (0.5 mg total) by mouth every 8 (eight) hours as needed for anxiety.   midodrine 10 MG tablet Commonly known as: PROAMATINE Take 1 tablet (10 mg total) by mouth 2 (two) times daily with a meal.   morphine 20 MG/ML concentrated solution Commonly known as: ROXANOL Take 0.5 mLs (10 mg total) by mouth  every 4 (four) hours as needed for severe pain.   nitroGLYCERIN 0.4 MG SL tablet Commonly known as: NITROSTAT Place 1 tablet (0.4 mg total) under the tongue every 5 (five) minutes x 3 doses as needed for chest pain.   NovoLIN 70/30 Kwikpen (70-30) 100 UNIT/ML KwikPen Generic drug: insulin isophane & regular human KwikPen Inject 35 Units into the skin in the morning and at bedtime. What changed: how much to take   ondansetron 8 MG tablet Commonly known as: ZOFRAN Take 1 tablet (8 mg total) by mouth every 8 (eight) hours as needed for nausea.   OneTouch Ultra test strip Generic drug: glucose blood Check blood glucose THREE TIMES DAILY        Follow-up Information     Satira Sark, MD Follow up.   Specialty: Cardiology Why: office scheduler will contact you to arrange a sooner follow up, please contact us if you have not heard from our scheduler in 3 business days. Contact information: [place] [address] [phone]                 Consultations: Cardiology  Nephrology  Palliative care  Procedures/Studies:  CT ABDOMEN PELVIS WO CONTRAST  Result Date: 09/12/2021 CLINICAL DATA:  Right upper quadrant pain nondiagnostic ultrasound EXAM: CT ABDOMEN AND PELVIS WITHOUT CONTRAST TECHNIQUE: Multidetector CT imaging of the abdomen and pelvis was performed following the standard protocol without IV contrast. COMPARISON:  None. FINDINGS: Lower chest: Small bilateral pleural effusions with compressive atelectasis of lower lobes. Heart is mildly enlarged. Scattered calcifications in the coronary arteries and aorta. Hepatobiliary: Prior cholecystectomy.  No focal hepatic abnormality. Pancreas: No focal abnormality or ductal dilatation. Spleen: No focal abnormality.  Normal size. Adrenals/Urinary Tract: Atrophic kidneys bilaterally. No hydronephrosis. No adrenal mass. Urinary bladder contains contrast, question source. No bladder wall abnormality.  Stomach/Bowel: Normal appendix. No bowel obstruction. There is mild inflammation in the right upper quadrant adjacent to the cholecystectomy clips. This possibly is related to the distal stomach or proximal duodenum. In addition, there are several small locules of gas adjacent to the descending colon (image 35 series 3). No diverticula seen in this area on prior study. This is concerning for extraluminal gas. Constellation of findings suggest possibility of peptic ulcer disease and perforated ulcer. Vascular/Lymphatic: Aortic atherosclerosis. No evidence of aneurysm or adenopathy. Reproductive: Uterus and adnexa unremarkable.  No mass. Other: No free fluid or free air. Musculoskeletal: No acute bony abnormality. IMPRESSION: Inflammation in the right upper quadrant adjacent to the distal stomach, proximal duodenum and cholecystectomy clips. This was not present on prior study. In addition, concern for extraluminal locules of gas adjacent to the descending duodenum. Constellation of findings concerning for peptic ulcer disease with possible perforated ulcer. Small bilateral pleural effusions with bibasilar atelectasis. Aortic atherosclerosis. Atrophic kidneys bilaterally. Contrast is seen within the urinary bladder of unknown source (I do not see a recent contrast enhanced CT). These results were called by telephone at the time of interpretation on 09/12/2021 at 2:46 pm to provider Dr. Lynetta Mare, Who verbally acknowledged these results. Electronically Signed   By: Rolm Baptise M.D.   On: 09/12/2021 14:46   DG Chest 2 View  Result Date: 08/24/2021 CLINICAL DATA:  Shortness of breath EXAM: CHEST - 2 VIEW COMPARISON:  07/12/2021 FINDINGS: Post sternotomy changes. Right-sided central venous catheter with tip at the cavoatrial region. Cardiomegaly with vascular congestion and probable mild edema. No pleural effusion or pneumothorax. IMPRESSION: Cardiomegaly with vascular congestion and probable mild edema.  Electronically Signed   By: Donavan Foil M.D.   On: 08/24/2021 22:05   CARDIAC CATHETERIZATION  Result Date: 09/09/2021   Ost LM to Dist LM lesion is 60% stenosed.   Balloon angioplasty was performed using a BALLN SAPPHIRE [place] 3.5X8.   Post intervention, there is a 30% residual stenosis. Successful PCI of the left main into the LCx with Shockwave therapy and aggressive balloon angioplasty. Significant improvement in stent expansion. There is occlusion of the first OM but this vessel was already severely diseased. Plan: DAPT with ASA and Plavix indefinitely. Continue aggressive medical therapy for residual disease. Anticipate dialysis tomorrow.   CARDIAC CATHETERIZATION  Result Date: 09/09/2021   Ost LM to Dist LM lesion is 50% stenosed.   Prox LAD to Mid LAD lesion is 100% stenosed.   Ost LAD to Prox LAD lesion is 100% stenosed.   Ost Cx to Mid Cx lesion is 40% stenosed.   Mid RCA to Dist RCA lesion is 30% stenosed.   Prox RCA lesion is 100% stenosed.   Origin lesion is 20% stenosed.   Mid Graft lesion is 15% stenosed.   Prox Graft-1 lesion is 15% stenosed.  Origin to Prox Graft lesion before 1st Mrg  is 100% stenosed.   Origin to Prox Graft lesion is 35% stenosed.   1st Mrg-1 lesion is 99% stenosed.   1st Mrg-2 lesion is 75% stenosed.   LPAV lesion is 80% stenosed.   Non-stenotic Prox Graft-2 lesion was previously treated.   SVG.   SVG due to known occlusion.   LIMA and is normal in caliber.   The graft exhibits moderate focal disease.   LV end diastolic pressure is normal. Severe 3 vessel obstructive CAD. Co dominant circulation.  The prior angioplasty site of the left main and LCx is improved with 50% residual. This was 90% before. Patent LIMA to the LAD. 30% in stent disease in the ostial LIMA Occluded SVG sequential to the OM1 and left PDA- chronic Patent SVG to the RCA. Stents with mild nonobstructive disease. Patent stent in the left subclavian. There is a 20 mm Hg pressure gradient. But the stent  appears to be widely patent. Normal LVEDP Plan: would recommend continued medical therapy. There are no suitable targets for PCI and prior PCI sites are patent  CARDIAC CATHETERIZATION  Result Date: 08/26/2021   Ost LAD to Prox LAD lesion is 100% stenosed.  Prox LAD to Mid LAD lesion is 100% stenosed.   Prox RCA lesion is 100% stenosed.  LPAV lesion is 80% stenosed.   1st Mrg-1 lesion is 99% stenosed.  1st Mrg-2 lesion is 75% stenosed.   -----------------------   [name] to Dist LM lesion is 95% stenosed.  Ost Cx to Mid Cx lesion is 99% stenosed.   Balloon angioplasty was performed using a BALLN SAPPHIRE [place] 3.0X12.   Post intervention, there is a 40% residual stenosis in the ostial to mid LCx, and 50% residual stenosis in the ostial LM-distal LM.Marland Kitchen   LIMA-LAD graft was visualized by angiography and is normal in caliber.  Origin to Prox Graft stent is 35% stenosed.   SVG-rPDA:  Ostial stent is 20% stenosed.  Prox Graft-1 proximal stent is 15% stenosed.  Mid Graft stent is 15% stenosed.  Otherwise mild diffuse disease in the both the stented and nondistended portions of the graft.   SVG-OM graft was not visualized due to known occlusion.  Origin to Prox Graft lesion before 1st Mrg  is 100% stenosed.   ---------- HEMODYNAMICS ----------   There is moderate left ventricular systolic dysfunction.   The left ventricular ejection fraction is 35-45% by visual estimate.   There is no aortic valve stenosis. SUMMARY Known severe native vessel disease: Known RCA CTO (not imaged), known LAD CTO.  Also known occluded SVG-OM LM-LCx stent 90 to 95% ISR (likely culprit lesion) with 90%/stable ostial small caliber OM branch.  LCx is a large likely codominant vessel.. (Moderately successful) Echo balloon PTCA of LM-LCx ISR using 3.0 mm [place] balloon further attempted balloon inflations were aborted due to patient having significant anginal pain and blood pressure drops with inflations) -> stenosis reduced to 50%.  TIMI-3 flow 35% ISR of  ostial LIMA graft Mild 20% ISR throughout ostial-proximal SVG-PDA graft 10 to 15 mmHg gradient through ostial L-Subclavian Artery Stent Moderate LVEDP with moderate reduced EF of 40 to 45% global HK.  (Recommend echocardiogram to better assess) Recommendations: Aggressive risk factor modification, if symptoms worsen, would then need to become more aggressive with the Left Main stent post dilation. [name], MD .   DG CHEST PORT 1 VIEW  Result Date: 09/12/2021 CLINICAL DATA:  Chest pain.  Follow-up.  EXAM: PORTABLE CHEST 1 VIEW COMPARISON:  09/08/2021 FINDINGS: Previous median sternotomy. Right internal jugular central line with tips at the SVC RA junction or proximal right atrium. Worsened congestive heart failure with worsened pulmonary edema. No visible pleural effusion. IMPRESSION: Worsened diffuse pulmonary edema. Electronically Signed   By: Nelson Chimes M.D.   On: 09/12/2021 14:57   DG Chest Port 1 View  Result Date: 09/08/2021 CLINICAL DATA:  Chest pain EXAM: PORTABLE CHEST 1 VIEW COMPARISON:  08/26/2021 FINDINGS: Mild cardiac enlargement. Negative for heart failure. Postop median sternotomy. Stent in the proximal left carotid or subclavian artery. Right jugular sent venous catheter tip in the right atrium unchanged. No pneumothorax Negative for edema.  No infiltrate or effusion. IMPRESSION: No acute abnormality. Improvement in vascular congestion and interstitial edema. Electronically Signed   By: Franchot Gallo M.D.   On: 09/08/2021 11:04   DG Chest Port 1 View  Result Date: 08/26/2021 CLINICAL DATA:  Chest pain and shortness of breath EXAM: PORTABLE CHEST 1 VIEW COMPARISON:  08/25/2021 FINDINGS: Cardiac shadow is stable. Postsurgical changes are noted. Dialysis catheter is noted. Increased vascular congestion and interstitial edema is noted when compared with the previous day likely related to volume overload. No focal infiltrate is seen. Left subclavian stent is again noted. IMPRESSION:  Increased vascular congestion and interstitial edema consistent with volume overload. Electronically Signed   By: Inez Catalina M.D.   On: 08/26/2021 02:23   DG Chest Port 1 View  Result Date: 08/25/2021 CLINICAL DATA:  66 year old female with shortness of breath. EXAM: PORTABLE CHEST - 1 VIEW COMPARISON:  08/24/2021, 07/14/2021 FINDINGS: The mediastinal contours are within normal limits. Unchanged cardiomegaly. Atherosclerotic calcification of the aortic arch. Unchanged position of indwelling right IJ tunneled hemodialysis catheter with the catheter tip near the cavoatrial junction. Unchanged appearance of left subclavian ostial stent. Similar appearing mild cephalization of pulmonary vasculature. Mild hazy bibasilar opacities, slightly increased from comparison. No pleural effusion or pneumothorax. No acute osseous abnormality. Median sternotomy wires appear intact. IMPRESSION: 1. Slightly worsened mild pulmonary edema. 2. Unchanged cardiomegaly. 3.  Aortic Atherosclerosis (ICD10-I70.0). Electronically Signed   By: Ruthann Cancer M.D.   On: 08/25/2021 07:58   ECHOCARDIOGRAM LIMITED  Result Date: 09/12/2021    ECHOCARDIOGRAM LIMITED REPORT   Patient Name:   RONNESHA MESTER Date of Exam: 09/12/2021 Medical Rec #:  [id number]        Height:       65.0 in Accession #:    [id number]       Weight:       216.9 lb Date of Birth:  06-07-1956       BSA:          2.048 m Patient Age:    4 years         BP:           95/25 mmHg Patient Gender: F                HR:           651 bpm. Exam Location:  Inpatient Procedure: Limited Echo, Color Doppler and Cardiac Doppler Indications:    Chest Pain R07.9  History:        Patient has prior history of Echocardiogram examinations, most                 recent 06/15/2021. CHF, CAD and Previous Myocardial Infarction,  PAD, Arrythmias:Bradycardia; Risk Factors:Hypertension,                 Dyslipidemia and Diabetes. End stage renal disease.  Sonographer:    Darlina Sicilian RDCS Referring Phys: [id number] PAULA V ROSS IMPRESSIONS  1. Left ventricular ejection fraction, by estimation, is 35 to 40%. The left ventricle has moderately decreased function. The left ventricle demonstrates global hypokinesis. The left ventricular internal cavity size was mildly dilated. There is mild concentric left ventricular hypertrophy. Left ventricular diastolic parameters are consistent with Grade II diastolic dysfunction (pseudonormalization). Elevated left atrial pressure. There is global left ventricular hypokinesis with severe lateral wall hypokinesis.  2. Right ventricular systolic function is normal. The right ventricular size is normal. Tricuspid regurgitation signal is inadequate for assessing PA pressure.  3. Left atrial size was mildly dilated.  4. Severe mitral valve regurgitation.  5. The aortic valve is tricuspid. There is mild calcification of the aortic valve. Aortic valve regurgitation is not visualized. Aortic valve sclerosis is present, with no evidence of aortic valve stenosis. Comparison(s): Prior images reviewed side by side. The left ventricular function is significantly worse. The left ventricular wall motion abnormality is new. There is new severe mitral insufficiency due to ischemic posterior leaflet tethering. FINDINGS  Left Ventricle: Left ventricular ejection fraction, by estimation, is 35 to 40%. The left ventricle has moderately decreased function. The left ventricle demonstrates global hypokinesis. The left ventricular internal cavity size was mildly dilated. There is mild concentric left ventricular hypertrophy. Left ventricular diastolic parameters are consistent with Grade II diastolic dysfunction (pseudonormalization). Elevated left atrial pressure.  LV Wall Scoring: The antero-lateral wall and posterior wall are hypokinetic. There is global left ventricular hypokinesis with severe lateral wall hypokinesis. Right Ventricle: The right ventricular size is normal. No  increase in right ventricular wall thickness. Right ventricular systolic function is normal. Tricuspid regurgitation signal is inadequate for assessing PA pressure. Left Atrium: Left atrial size was mildly dilated. Right Atrium: Right atrial size was normal in size. Pericardium: There is no evidence of pericardial effusion. Mitral Valve: Mild to moderate mitral annular calcification. Severe mitral valve regurgitation, with eccentric laterally directed jet. Tricuspid Valve: The tricuspid valve is normal in structure. Tricuspid valve regurgitation is trivial. Aortic Valve: The aortic valve is tricuspid. There is mild calcification of the aortic valve. Aortic valve regurgitation is not visualized. Aortic valve sclerosis is present, with no evidence of aortic valve stenosis. Pulmonic Valve: The pulmonic valve was not well visualized. Pulmonic valve regurgitation is not visualized. Aorta: The aortic root is normal in size and structure. IAS/Shunts: No atrial level shunt detected by color flow Doppler. LEFT VENTRICLE PLAX 2D LVIDd:         5.68 cm LVIDs:         5.04 cm LV PW:         1.24 cm LV IVS:        1.30 cm LVOT diam:     1.95 cm LV SV:         58 LV SV Index:   28 LVOT Area:     2.98 cm  AORTIC VALVE LVOT Vmax:   120.62 cm/s LVOT Vmean:  71.701 cm/s LVOT VTI:    0.194 m MITRAL VALVE MV Area (plan): 2.62 cm  SHUNTS                           Systemic VTI:  0.19 m  Systemic Diam: 1.95 cm Dani Gobble Croitoru MD Electronically signed by Sanda Klein MD Signature Date/Time: 09/12/2021/2:39:01 PM    Final      Subjective: - no chest pain, shortness of breath, no abdominal pain, nausea or vomiting.   Discharge Exam: BP (!) 84/36 (BP Location: Left Arm)    Pulse 74    Temp 97.8 F (36.6 C)    Resp 16    Ht 5\' 5"  (1.651 m)    Wt 87.8 kg    SpO2 93%    BMI 32.21 kg/m   General: Pt is alert, awake, not in acute distress   The results of significant diagnostics from this hospitalization  (including imaging, microbiology, ancillary and laboratory) are listed below for reference.     Microbiology: No results found for this or any previous visit (from the past 240 hour(s)).   Labs: Basic Metabolic Panel: Recent Labs  Lab 09/14/21 1739 09/15/21 0430 09/15/21 1557 09/16/21 0445 09/16/21 1616  NA 135 133* 135 134* 135  K 4.3 4.1 4.0 4.1 3.9  CL 101 101 100 102 99  CO2 24 23 24 23 24   GLUCOSE 152* 173* 188* 229* 212*  BUN 19 14 12 10 11   CREATININE 2.36* 1.83* 1.43* 1.51* 1.61*  CALCIUM 8.8* 8.5* 8.7* 8.8* 9.0  MG  --  2.4  --  2.6*  --   PHOS 2.2* 1.9* 1.5* 1.5* 1.5*   Liver Function Tests: Recent Labs  Lab 09/15/21 0430 09/15/21 1557 09/16/21 0445 09/16/21 1616 09/17/21 0410  AST 1,580*  --  1,407*  --  344*  ALT 752*  --  1,065*  --  648*  ALKPHOS 186*  --  245*  --  171*  BILITOT 1.2  --  1.5*  --  1.1  PROT 6.0*  --  6.8  --  6.0*  ALBUMIN 2.9*   2.9* 3.1* 3.3*   3.2* 3.2* 2.9*   CBC: No results for input(s): WBC, NEUTROABS, HGB, HCT, MCV, PLT in the last 168 hours. CBG: Recent Labs  Lab 09/16/21 1142 09/16/21 1616 09/16/21 2244 09/17/21 0708 09/17/21 1116  GLUCAP 185* 193* 223* 275* 144*   Hgb A1c No results for input(s): HGBA1C in the last 72 hours. Lipid Profile No results for input(s): CHOL, HDL, LDLCALC, TRIG, CHOLHDL, LDLDIRECT in the last 72 hours. Thyroid function studies No results for input(s): TSH, T4TOTAL, T3FREE, THYROIDAB in the last 72 hours.  Invalid input(s): FREET3 Urinalysis    Component Value Date/Time   COLORURINE YELLOW 06/18/2020 1536   APPEARANCEUR CLOUDY (A) 06/18/2020 1536   LABSPEC 1.015 06/18/2020 1536   PHURINE 5.0 06/18/2020 1536   GLUCOSEU 150 (A) 06/18/2020 1536   HGBUR NEGATIVE 06/18/2020 [place] 06/18/2020 1536   KETONESUR NEGATIVE 06/18/2020 1536   PROTEINUR >=300 (A) 06/18/2020 1536   UROBILINOGEN 0.2 10/26/2013 2229   NITRITE NEGATIVE 06/18/2020 1536   LEUKOCYTESUR  NEGATIVE 06/18/2020 1536    FURTHER DISCHARGE INSTRUCTIONS:   Get Medicines reviewed and adjusted: Please take all your medications with you for your next visit with your Primary MD   Laboratory/radiological data: Please request your Primary MD to go over all hospital tests and procedure/radiological results at the follow up, please ask your Primary MD to get all Hospital records sent to his/her office.   In some cases, they will be blood work, cultures and biopsy results pending at the time of your discharge. Please request that your primary care M.D. goes through all the records  of your hospital data and follows up on these results.   Also Note the following: If you experience worsening of your admission symptoms, develop shortness of breath, life threatening emergency, suicidal or homicidal thoughts you must seek medical attention immediately by calling 911 or calling your MD immediately  if symptoms less severe.   You must read complete instructions/literature along with all the possible adverse reactions/side effects for all the Medicines you take and that have been prescribed to you. Take any new Medicines after you have completely understood and accpet all the possible adverse reactions/side effects.    Do not drive when taking Pain medications or sleeping medications (Benzodaizepines)   Do not take more than prescribed Pain, Sleep and Anxiety Medications. It is not advisable to combine anxiety,sleep and pain medications without talking with your primary care practitioner   Special Instructions: If you have smoked or chewed Tobacco  in the last 2 yrs please stop smoking, stop any regular Alcohol  and or any Recreational drug use.   Wear Seat belts while driving.   Please note: You were cared for by a hospitalist during your hospital stay. Once you are discharged, your primary care physician will handle any further medical issues. Please note that NO REFILLS for any discharge  medications will be authorized once you are discharged, as it is imperative that you return to your primary care physician (or establish a relationship with a primary care physician if you do not have one) for your post hospital discharge needs so that they can reassess your need for medications and monitor your lab values.  Time coordinating discharge: 40 minutes  SIGNED:  Marzetta Board, MD, PhD 09/21/2021, 2:39 PM

## 2021-09-30 ENCOUNTER — Ambulatory Visit: Payer: Medicare HMO | Admitting: Medical

## 2021-10-05 ENCOUNTER — Ambulatory Visit: Payer: Medicare HMO | Admitting: Cardiology

## 2021-10-10 ENCOUNTER — Ambulatory Visit: Payer: Medicare HMO | Admitting: Medical

## 2021-10-19 DEATH — deceased

## 2021-10-20 ENCOUNTER — Ambulatory Visit: Payer: Medicare HMO | Admitting: Student

## 2021-10-26 ENCOUNTER — Ambulatory Visit: Payer: Medicare HMO | Admitting: Student

## 2022-08-06 IMAGING — DX DG ABDOMEN 1V
2 series · 2 of 2 positions shown · non-contrast
Comparison: Radiograph 01/09/2010, chest radiograph 06/12/2020

CLINICAL DATA: Abdominal bloating

EXAM:
ABDOMEN - 1 VIEW

[abdomen supine (1 of 2)]
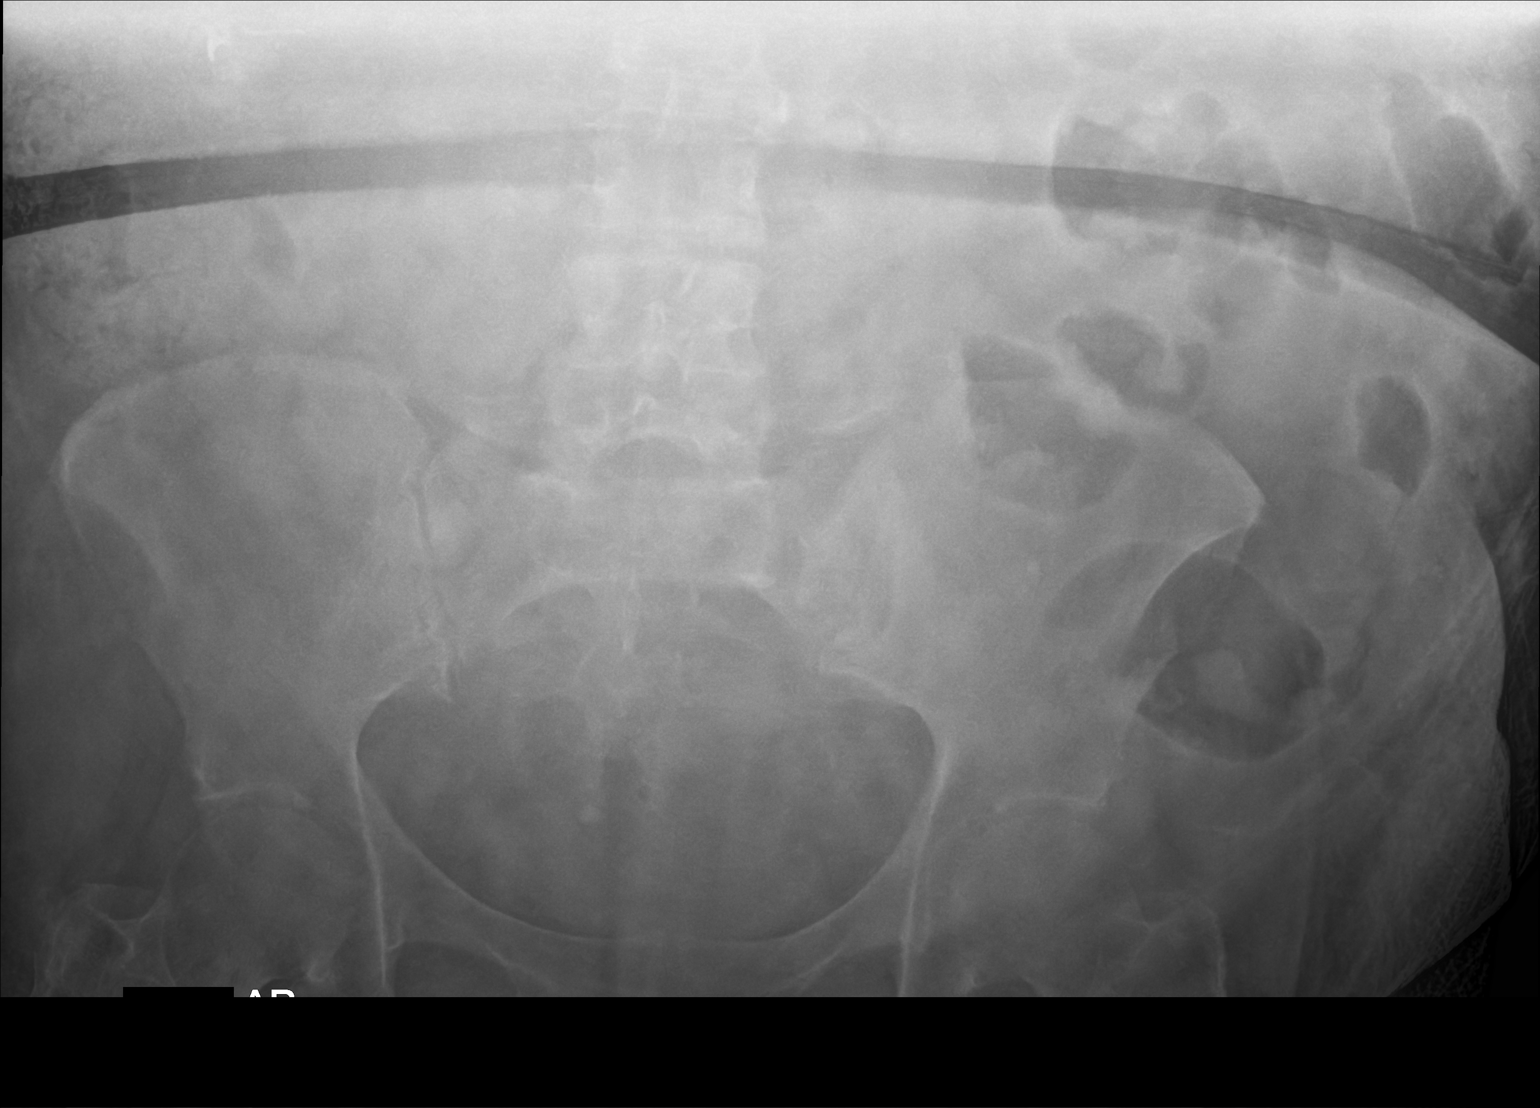

[abdomen supine (2 of 2)]
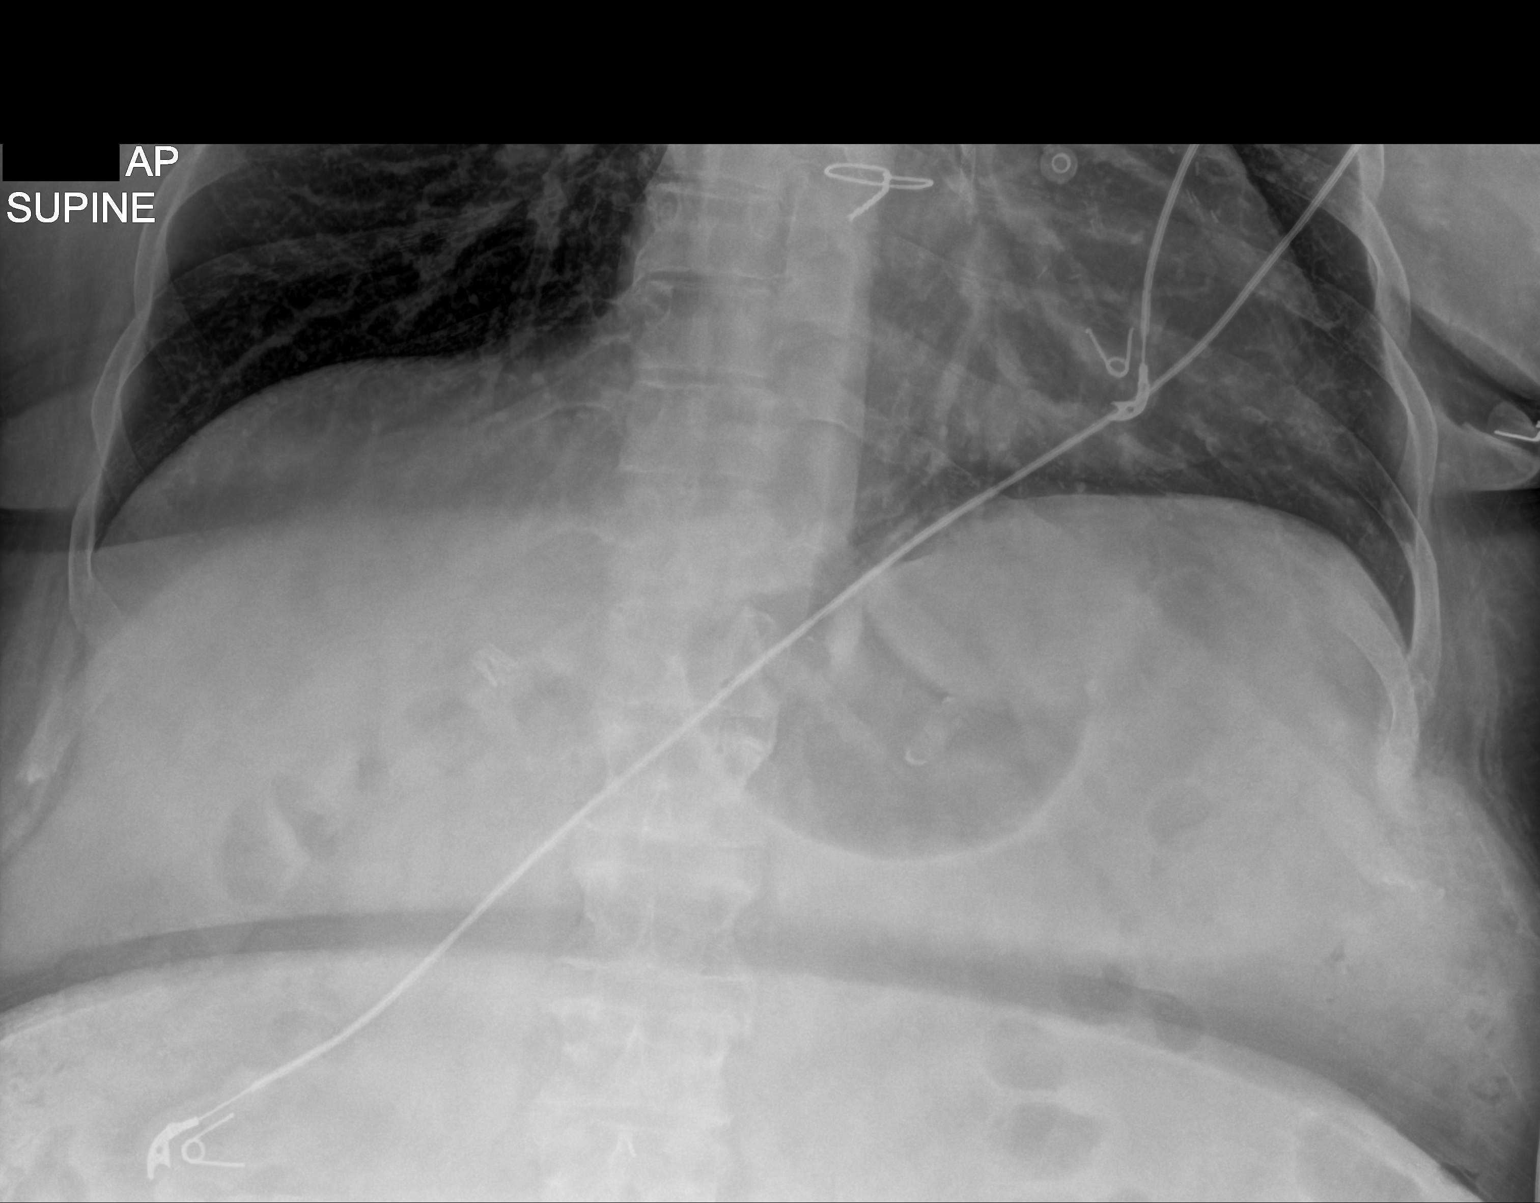

[2 of 2 positions shown; findings below may reference images not displayed]

FINDINGS: No high-grade obstructive bowel gas pattern is seen. There is a
moderate colonic stool burden. About limited evaluation for free air
on this supine only radiograph. No Ahti sign or other secondary
features. Cholecystectomy clips in the right upper quadrant.
Vascular calcium noted in the upper abdomen. Prior sternotomy and
CABG as well as vascular stenting of the coronaries seen in the lung
bases with some mild atelectatic change. No acute or suspicious
osseous abnormalities. Degenerative changes in the lumbar spine,
hips and pelvis.
IMPRESSION: 1. No high-grade obstructive bowel gas pattern.
2. Moderate colonic stool burden.
3. Prior cholecystectomy.
4. Bibasilar atelectasis.
5. Prior sternotomy and CABG.

## 2022-08-14 IMAGING — XA IR FLUORO GUIDE CV LINE*R*
1 series · 1 of 1 positions shown · non-contrast
Comparison: none

INDICATION: End-stage renal disease. In need of durable access for the
initiation of hemodialysis.

[Series 1: single · 1 of 1 slices shown]
[im 1/1]
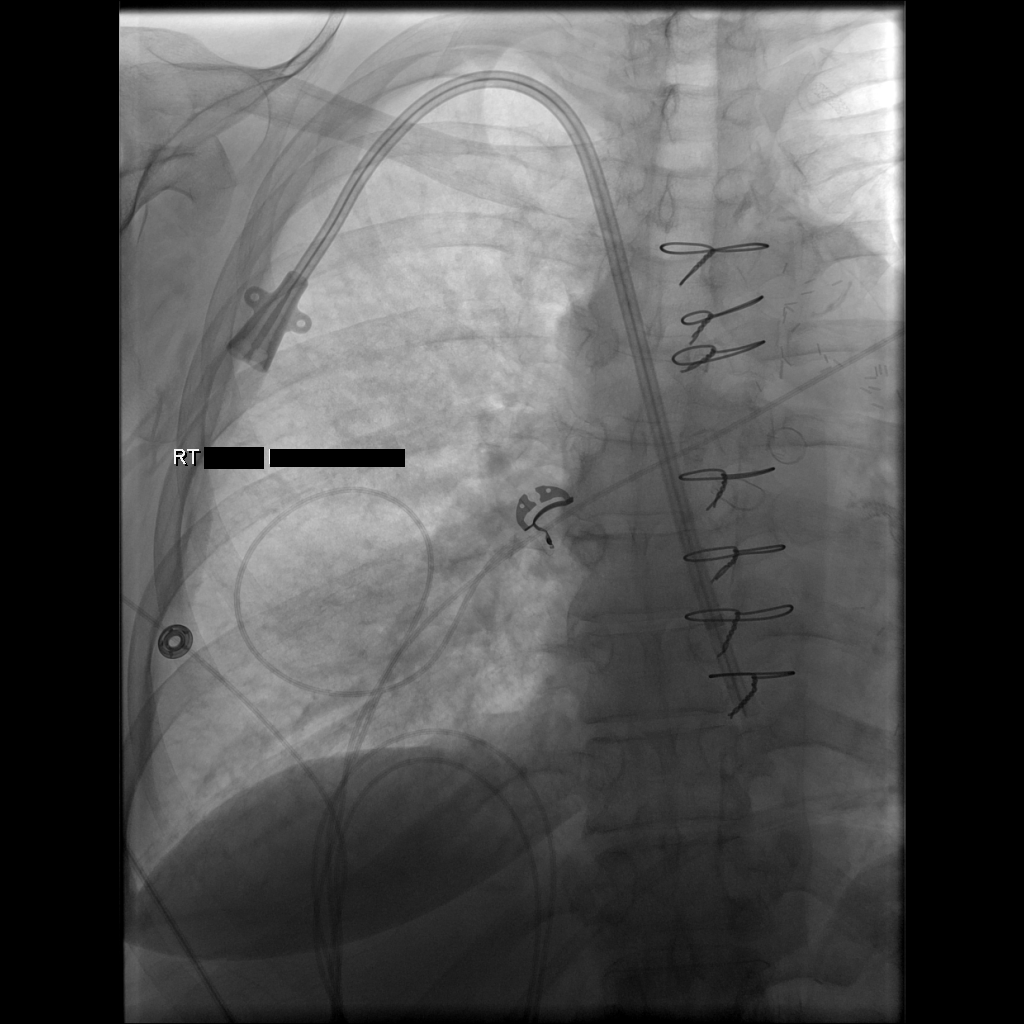

[1 of 1 positions shown; findings below may reference images not displayed]

EXAM:
TUNNELED CENTRAL VENOUS HEMODIALYSIS CATHETER PLACEMENT WITH
ULTRASOUND AND FLUOROSCOPIC GUIDANCE

MEDICATIONS:
Vancomycin 1 gm IV . The antibiotic was given in an appropriate time
interval prior to skin puncture.

ANESTHESIA/SEDATION:
Moderate (conscious) sedation was employed during this procedure. A
total of Versed 1.5 mg and Fentanyl 550 mcg was administered
intravenously.

Moderate Sedation Time: 10 minutes. The patient's level of
consciousness and vital signs were monitored continuously by
radiology nursing throughout the procedure under my direct
supervision.

FLUOROSCOPY TIME:  18 seconds (5 mGy)

COMPLICATIONS:
None immediate.



After creating a small venotomy incision, a micropuncture kit was
utilized to access the internal jugular vein. Real-time ultrasound
guidance was utilized for vascular access including the acquisition
of a permanent ultrasound image documenting patency of the accessed
vessel. The microwire was utilized to measure appropriate catheter
length.

A stiff Glidewire was advanced to the level of the IVC and the
micropuncture sheath was exchanged for a peel-away sheath. A
palindrome tunneled hemodialysis catheter measuring 23 cm from tip
to cuff was tunneled in a retrograde fashion from the anterior chest
wall to the venotomy incision.

The catheter was then placed through the peel-away sheath with tips
ultimately positioned within the superior aspect of the right
atrium. Final catheter positioning was confirmed and documented with
a spot radiographic image. The catheter aspirates and flushes
normally. The catheter was flushed with appropriate volume heparin
dwells.

The catheter exit site was secured with a 0-Prolene retention
suture. The venotomy incision was closed with an interrupted 4-0
Vicryl, Dermabond and Soni. Dressings were applied. The
patient tolerated the procedure well without immediate post
procedural complication.
IMPRESSION: Successful placement of 23 cm tip to cuff tunneled hemodialysis
catheter via the right internal jugular vein with tips terminating
within the superior aspect of the right atrium. The catheter is
ready for immediate use.
